# Patient Record
Sex: Female | Born: 1950 | Race: Black or African American | Hispanic: No | Smoking: Former smoker
Health system: Southern US, Community
[De-identification: ages and names within clinical notes are randomized; demographics above are authoritative.]

## PROBLEM LIST (undated history)

## (undated) DIAGNOSIS — I509 Heart failure, unspecified: Secondary | ICD-10-CM

## (undated) DIAGNOSIS — Z9861 Coronary angioplasty status: Secondary | ICD-10-CM

## (undated) DIAGNOSIS — I739 Peripheral vascular disease, unspecified: Secondary | ICD-10-CM

## (undated) DIAGNOSIS — I2510 Atherosclerotic heart disease of native coronary artery without angina pectoris: Secondary | ICD-10-CM

## (undated) DIAGNOSIS — E785 Hyperlipidemia, unspecified: Secondary | ICD-10-CM

## (undated) DIAGNOSIS — E669 Obesity, unspecified: Secondary | ICD-10-CM

## (undated) DIAGNOSIS — E039 Hypothyroidism, unspecified: Secondary | ICD-10-CM

## (undated) DIAGNOSIS — Z955 Presence of coronary angioplasty implant and graft: Secondary | ICD-10-CM

## (undated) DIAGNOSIS — H7190 Unspecified cholesteatoma, unspecified ear: Secondary | ICD-10-CM

## (undated) DIAGNOSIS — I10 Essential (primary) hypertension: Secondary | ICD-10-CM

## (undated) DIAGNOSIS — I2119 ST elevation (STEMI) myocardial infarction involving other coronary artery of inferior wall: Secondary | ICD-10-CM

## (undated) DIAGNOSIS — E1169 Type 2 diabetes mellitus with other specified complication: Secondary | ICD-10-CM

## (undated) DIAGNOSIS — E119 Type 2 diabetes mellitus without complications: Secondary | ICD-10-CM

## (undated) DIAGNOSIS — E230 Hypopituitarism: Secondary | ICD-10-CM

## (undated) HISTORY — DX: Presence of coronary angioplasty implant and graft: Z95.5

## (undated) HISTORY — PX: PITUITARY SURGERY: SHX203

## (undated) HISTORY — DX: Coronary angioplasty status: Z98.61

## (undated) HISTORY — DX: ST elevation (STEMI) myocardial infarction involving other coronary artery of inferior wall: I21.19

## (undated) HISTORY — DX: Type 2 diabetes mellitus with other specified complication: E11.69

## (undated) HISTORY — PX: CHOLECYSTECTOMY: SHX55

## (undated) HISTORY — DX: Obesity, unspecified: E66.9

## (undated) HISTORY — DX: Essential (primary) hypertension: I10

## (undated) HISTORY — DX: Hypopituitarism: E23.0

## (undated) HISTORY — PX: EYE SURGERY: SHX253

## (undated) HISTORY — DX: Type 2 diabetes mellitus without complications: E11.9

## (undated) HISTORY — PX: OTHER SURGICAL HISTORY: SHX169

## (undated) HISTORY — DX: Hyperlipidemia, unspecified: E78.5

## (undated) HISTORY — DX: Atherosclerotic heart disease of native coronary artery without angina pectoris: I25.10

## (undated) HISTORY — DX: Peripheral vascular disease, unspecified: I73.9

## (undated) HISTORY — DX: Hypothyroidism, unspecified: E03.9

---

## 1997-10-17 ENCOUNTER — Ambulatory Visit (HOSPITAL_COMMUNITY): Admission: RE | Admit: 1997-10-17 | Discharge: 1997-10-17 | Payer: Self-pay | Admitting: Neurological Surgery

## 1998-10-12 ENCOUNTER — Ambulatory Visit (HOSPITAL_COMMUNITY): Admission: RE | Admit: 1998-10-12 | Discharge: 1998-10-12 | Payer: Self-pay | Admitting: Neurological Surgery

## 1998-10-15 ENCOUNTER — Encounter: Payer: Self-pay | Admitting: Neurological Surgery

## 1999-08-27 ENCOUNTER — Ambulatory Visit (HOSPITAL_COMMUNITY): Admission: RE | Admit: 1999-08-27 | Discharge: 1999-08-27 | Payer: Self-pay | Admitting: Neurological Surgery

## 1999-08-27 ENCOUNTER — Encounter: Payer: Self-pay | Admitting: Neurological Surgery

## 2000-10-03 ENCOUNTER — Encounter: Payer: Self-pay | Admitting: Neurological Surgery

## 2000-10-03 ENCOUNTER — Ambulatory Visit (HOSPITAL_COMMUNITY): Admission: RE | Admit: 2000-10-03 | Discharge: 2000-10-03 | Payer: Self-pay | Admitting: Neurological Surgery

## 2007-04-14 ENCOUNTER — Emergency Department (HOSPITAL_COMMUNITY): Admission: EM | Admit: 2007-04-14 | Discharge: 2007-04-14 | Payer: Self-pay | Admitting: Emergency Medicine

## 2007-09-16 ENCOUNTER — Emergency Department (HOSPITAL_COMMUNITY): Admission: EM | Admit: 2007-09-16 | Discharge: 2007-09-16 | Payer: Self-pay | Admitting: Emergency Medicine

## 2009-05-18 DIAGNOSIS — E785 Hyperlipidemia, unspecified: Secondary | ICD-10-CM | POA: Insufficient documentation

## 2009-06-15 ENCOUNTER — Encounter: Admission: RE | Admit: 2009-06-15 | Discharge: 2009-06-15 | Payer: Self-pay | Admitting: Endocrinology

## 2009-10-30 ENCOUNTER — Encounter: Admission: RE | Admit: 2009-10-30 | Discharge: 2009-10-30 | Payer: Self-pay | Admitting: Endocrinology

## 2011-02-13 ENCOUNTER — Encounter: Payer: Self-pay | Admitting: Adult Health

## 2011-02-13 ENCOUNTER — Emergency Department (HOSPITAL_COMMUNITY)
Admission: EM | Admit: 2011-02-13 | Discharge: 2011-02-13 | Disposition: A | Discharge status: Home / Self Care | Payer: BC Managed Care – PPO | Attending: Emergency Medicine | Admitting: Emergency Medicine

## 2011-02-13 ENCOUNTER — Emergency Department (HOSPITAL_COMMUNITY): Payer: BC Managed Care – PPO

## 2011-02-13 DIAGNOSIS — E119 Type 2 diabetes mellitus without complications: Secondary | ICD-10-CM | POA: Insufficient documentation

## 2011-02-13 DIAGNOSIS — Z794 Long term (current) use of insulin: Secondary | ICD-10-CM | POA: Insufficient documentation

## 2011-02-13 DIAGNOSIS — M79609 Pain in unspecified limb: Secondary | ICD-10-CM | POA: Insufficient documentation

## 2011-02-13 DIAGNOSIS — M722 Plantar fascial fibromatosis: Secondary | ICD-10-CM | POA: Insufficient documentation

## 2011-02-13 DIAGNOSIS — M79673 Pain in unspecified foot: Secondary | ICD-10-CM

## 2011-02-13 MED ORDER — HYDROCODONE-ACETAMINOPHEN 5-500 MG PO TABS: 1.0000 | ORAL_TABLET | Freq: Four times a day (QID) | ORAL | Status: AC | PRN

## 2011-02-13 MED ORDER — IBUPROFEN 600 MG PO TABS: 600.0000 mg | ORAL_TABLET | Freq: Four times a day (QID) | ORAL | Status: AC | PRN

## 2011-02-13 MED ORDER — IBUPROFEN 200 MG PO TABS
600.0000 mg | ORAL_TABLET | Freq: Once | ORAL | Status: AC
  Administered 2011-02-13: 600 mg via ORAL
  Filled 2011-02-13: qty 3

## 2011-02-13 NOTE — ED Notes (Signed)
Right foot has been hurting for a couple of weeks, this am she stepped on her heel and heard a crunch associated with a sharp pain that went up the back of her heel and calf. Unable to bear weight on right foot at this time.

## 2011-02-13 NOTE — ED Notes (Signed)
Patient transported to X-ray 

## 2011-02-13 NOTE — ED Notes (Signed)
Given crackers and peanut butter  

## 2011-02-13 NOTE — ED Provider Notes (Signed)
History     CSN: [id number] Arrival date & time: 02/13/2011 10:03 AM   First MD Initiated Contact with Patient 02/13/11 1027      Chief Complaint  Patient presents with  . Foot Pain    (Consider location/radiation/quality/duration/timing/severity/associated sxs/prior treatment) Patient is a 60 y.o. female presenting with lower extremity pain. The history is provided by the patient.  Foot Pain  pt states that has been having pain in right heel for past 2-3 weeks. Worse w wt bearing. Dull, constant, non radiating pain. Denies hx plantar fasciitis. States today stood up onto right foot and had crunching sensation in right heel. No calf/achilles tendon area pain. No injury/twisting. No skin changes, swelling, redness or rash. No numbness/weakness.  Past Medical History  Diagnosis Date  . Diabetes mellitus   . Thyroid disease     Past Surgical History  Procedure Date  . Pituitary surgery     History reviewed. No pertinent family history.  History  Substance Use Topics  . Smoking status: Current Everyday Smoker  . Smokeless tobacco: Not on file  . Alcohol Use: No    OB History    Grav Para Term Preterm Abortions TAB SAB Ect Mult Living                  Review of Systems  Constitutional: Negative for fever and chills.  Cardiovascular: Negative for leg swelling.  Neurological: Negative for weakness and numbness.    Allergies  Review of patient's allergies indicates no known allergies.  Home Medications   Current Outpatient Rx  Name Route Sig Dispense Refill  . DEXAMETHASONE 1 MG PO TABS Oral Take 0.5 mg by mouth 2 (two) times daily with a meal.     . INSULIN GLARGINE 100 UNIT/ML [place] SOLN Subcutaneous Inject 42 Units into the skin at bedtime.      . INSULIN LISPRO (HUMAN) 100 UNIT/ML [place] SOLN Subcutaneous Inject into the skin 3 (three) times daily before meals.      [name] LEVOTHYROXINE SODIUM 125 MCG PO TABS Oral Take 175 mcg by mouth daily.        BP 167/89  Pulse  73  Temp(Src) 97.7 F (36.5 C) (Oral)  Resp 20  SpO2 100%  Physical Exam  Nursing note and vitals reviewed. Constitutional: She is oriented to person, place, and time. She appears well-developed and well-nourished. No distress.  Eyes: Conjunctivae are normal. No scleral icterus.  Neck: Neck supple. No tracheal deviation present.  Cardiovascular: Normal rate and intact distal pulses.   Pulmonary/Chest: Effort normal. No respiratory distress.  Abdominal: Normal appearance. She exhibits no distension.  Musculoskeletal: She exhibits no edema.       Skin intact. Right dp/pt intact. Ankle stable, no malleolar tenderness. Tenderness right heel inferiorly and along plantar fascia. Also tenderness to heel laterally. Achilles intact. Pt able to plantar and dorsiflex - with dorsiflexion, pain in heel area inferiorly. No calf swelling or tenderness. No cellulitis.    Neurological: She is alert and oriented to person, place, and time.       Motor intact  Skin: Skin is warm and dry. No rash noted.  Psychiatric: She has a normal mood and affect.    ED Course  Procedures (including critical care time)  Labs Reviewed - No data to display No results found. No results found for this or any previous visit. Dg Foot Complete Right  02/13/2011  *RADIOLOGY REPORT*  Clinical Data: Injury  RIGHT FOOT COMPLETE - 3+ VIEW  Comparison: None.  Findings: Minimal spur at the inferior calcaneus.  Mild hallux valgus.  Mild degenerative change of the first metatarsal- phalangeal joint.  No acute fracture and no dislocation.  The soft tissue swelling at the ankle. Mild osteopenia.  IMPRESSION: No acute bony pathology.  Chronic change.  Original Report Authenticated By: Donavan Burnet, M.D.      No diagnosis found.    MDM  Xrays. Pt drove self. Motrin po.         Suzi Roots, MD 02/13/11 1159

## 2013-08-22 ENCOUNTER — Encounter (INDEPENDENT_AMBULATORY_CARE_PROVIDER_SITE_OTHER): Payer: Self-pay | Admitting: Surgery

## 2013-08-22 DIAGNOSIS — I2510 Atherosclerotic heart disease of native coronary artery without angina pectoris: Secondary | ICD-10-CM | POA: Insufficient documentation

## 2013-08-22 DIAGNOSIS — I214 Non-ST elevation (NSTEMI) myocardial infarction: Secondary | ICD-10-CM | POA: Insufficient documentation

## 2013-08-22 DIAGNOSIS — Z9861 Coronary angioplasty status: Secondary | ICD-10-CM

## 2013-08-22 DIAGNOSIS — I2119 ST elevation (STEMI) myocardial infarction involving other coronary artery of inferior wall: Secondary | ICD-10-CM

## 2013-08-22 HISTORY — DX: Atherosclerotic heart disease of native coronary artery without angina pectoris: I25.10

## 2013-08-22 HISTORY — DX: ST elevation (STEMI) myocardial infarction involving other coronary artery of inferior wall: I21.19

## 2013-08-29 ENCOUNTER — Inpatient Hospital Stay (HOSPITAL_COMMUNITY)
Admission: EM | Admit: 2013-08-29 | Discharge: 2013-09-03 | DRG: 250 | Disposition: A | Discharge status: Home / Self Care | Payer: BC Managed Care – PPO | Attending: Internal Medicine | Admitting: Internal Medicine

## 2013-08-29 ENCOUNTER — Encounter (HOSPITAL_COMMUNITY): Payer: Self-pay | Admitting: Emergency Medicine

## 2013-08-29 DIAGNOSIS — I2119 ST elevation (STEMI) myocardial infarction involving other coronary artery of inferior wall: Secondary | ICD-10-CM | POA: Diagnosis present

## 2013-08-29 DIAGNOSIS — D62 Acute posthemorrhagic anemia: Secondary | ICD-10-CM | POA: Diagnosis present

## 2013-08-29 DIAGNOSIS — K260 Acute duodenal ulcer with hemorrhage: Secondary | ICD-10-CM | POA: Diagnosis not present

## 2013-08-29 DIAGNOSIS — E876 Hypokalemia: Secondary | ICD-10-CM | POA: Diagnosis present

## 2013-08-29 DIAGNOSIS — E039 Hypothyroidism, unspecified: Secondary | ICD-10-CM | POA: Diagnosis present

## 2013-08-29 DIAGNOSIS — I5032 Chronic diastolic (congestive) heart failure: Secondary | ICD-10-CM | POA: Diagnosis present

## 2013-08-29 DIAGNOSIS — K429 Umbilical hernia without obstruction or gangrene: Secondary | ICD-10-CM | POA: Diagnosis present

## 2013-08-29 DIAGNOSIS — I959 Hypotension, unspecified: Secondary | ICD-10-CM | POA: Diagnosis present

## 2013-08-29 DIAGNOSIS — K2960 Other gastritis without bleeding: Secondary | ICD-10-CM | POA: Diagnosis present

## 2013-08-29 DIAGNOSIS — I2582 Chronic total occlusion of coronary artery: Secondary | ICD-10-CM | POA: Diagnosis present

## 2013-08-29 DIAGNOSIS — R062 Wheezing: Secondary | ICD-10-CM

## 2013-08-29 DIAGNOSIS — K2980 Duodenitis without bleeding: Secondary | ICD-10-CM | POA: Diagnosis present

## 2013-08-29 DIAGNOSIS — IMO0002 Reserved for concepts with insufficient information to code with codable children: Secondary | ICD-10-CM

## 2013-08-29 DIAGNOSIS — K264 Chronic or unspecified duodenal ulcer with hemorrhage: Secondary | ICD-10-CM | POA: Diagnosis present

## 2013-08-29 DIAGNOSIS — R509 Fever, unspecified: Secondary | ICD-10-CM | POA: Diagnosis present

## 2013-08-29 DIAGNOSIS — E119 Type 2 diabetes mellitus without complications: Secondary | ICD-10-CM

## 2013-08-29 DIAGNOSIS — Z87891 Personal history of nicotine dependence: Secondary | ICD-10-CM

## 2013-08-29 DIAGNOSIS — E230 Hypopituitarism: Secondary | ICD-10-CM | POA: Diagnosis present

## 2013-08-29 DIAGNOSIS — E2749 Other adrenocortical insufficiency: Secondary | ICD-10-CM | POA: Diagnosis present

## 2013-08-29 DIAGNOSIS — I10 Essential (primary) hypertension: Secondary | ICD-10-CM

## 2013-08-29 DIAGNOSIS — IMO0001 Reserved for inherently not codable concepts without codable children: Secondary | ICD-10-CM

## 2013-08-29 DIAGNOSIS — A419 Sepsis, unspecified organism: Secondary | ICD-10-CM

## 2013-08-29 DIAGNOSIS — I2510 Atherosclerotic heart disease of native coronary artery without angina pectoris: Secondary | ICD-10-CM | POA: Diagnosis present

## 2013-08-29 DIAGNOSIS — R579 Shock, unspecified: Secondary | ICD-10-CM | POA: Diagnosis present

## 2013-08-29 DIAGNOSIS — Z794 Long term (current) use of insulin: Secondary | ICD-10-CM

## 2013-08-29 DIAGNOSIS — K648 Other hemorrhoids: Secondary | ICD-10-CM | POA: Diagnosis present

## 2013-08-29 DIAGNOSIS — K922 Gastrointestinal hemorrhage, unspecified: Secondary | ICD-10-CM

## 2013-08-29 LAB — I-STAT CHEM 8, ED
BUN: 9 mg/dL (ref 6–23)
Calcium, Ion: 1.14 mmol/L (ref 1.13–1.30)
Chloride: 97 mEq/L (ref 96–112)
Creatinine, Ser: 1.1 mg/dL (ref 0.50–1.10)
Glucose, Bld: 154 mg/dL — ABNORMAL HIGH (ref 70–99)
HCT: 46 % (ref 36.0–46.0)
Hemoglobin: 15.6 g/dL — ABNORMAL HIGH (ref 12.0–15.0)
Potassium: 3.7 mEq/L (ref 3.7–5.3)
Sodium: 142 mEq/L (ref 137–147)
TCO2: 26 mmol/L (ref 0–100)

## 2013-08-29 LAB — CBC WITH DIFFERENTIAL/PLATELET
Basophils Absolute: 0.1 10*3/uL (ref 0.0–0.1)
Basophils Relative: 1 % (ref 0–1)
Eosinophils Absolute: 0 10*3/uL (ref 0.0–0.7)
Eosinophils Relative: 0 % (ref 0–5)
HCT: 40.8 % (ref 36.0–46.0)
Hemoglobin: 13 g/dL (ref 12.0–15.0)
Lymphocytes Relative: 16 % (ref 12–46)
Lymphs Abs: 2.4 10*3/uL (ref 0.7–4.0)
MCH: 27.9 pg (ref 26.0–34.0)
MCHC: 31.9 g/dL (ref 30.0–36.0)
MCV: 87.6 fL (ref 78.0–100.0)
Monocytes Absolute: 1.5 10*3/uL — ABNORMAL HIGH (ref 0.1–1.0)
Monocytes Relative: 10 % (ref 3–12)
Neutro Abs: 10.4 10*3/uL — ABNORMAL HIGH (ref 1.7–7.7)
Neutrophils Relative %: 73 % (ref 43–77)
Platelets: 399 10*3/uL (ref 150–400)
RBC: 4.66 MIL/uL (ref 3.87–5.11)
RDW: 15 % (ref 11.5–15.5)
WBC: 14.4 10*3/uL — ABNORMAL HIGH (ref 4.0–10.5)

## 2013-08-29 LAB — I-STAT TROPONIN, ED: Troponin i, poc: 25.98 ng/mL (ref 0.00–0.08)

## 2013-08-29 LAB — I-STAT CG4 LACTIC ACID, ED: Lactic Acid, Venous: 2.56 mmol/L — ABNORMAL HIGH (ref 0.5–2.2)

## 2013-08-29 MED ORDER — ASPIRIN 81 MG PO CHEW
324.0000 mg | CHEWABLE_TABLET | Freq: Once | ORAL | Status: AC
  Administered 2013-08-29: 324 mg via ORAL

## 2013-08-29 MED ORDER — HYDROCORTISONE NA SUCCINATE PF 100 MG IJ SOLR
100.0000 mg | INTRAMUSCULAR | Status: AC
  Administered 2013-08-30: 100 mg via INTRAVENOUS
  Filled 2013-08-29: qty 2

## 2013-08-29 MED ORDER — SODIUM CHLORIDE 0.9 % IV BOLUS (SEPSIS)
1000.0000 mL | Freq: Once | INTRAVENOUS | Status: AC
  Administered 2013-08-29: 1000 mL via INTRAVENOUS

## 2013-08-29 MED ORDER — ACETAMINOPHEN 650 MG RE SUPP
650.0000 mg | Freq: Once | RECTAL | Status: AC
  Administered 2013-08-29: 650 mg via RECTAL
  Filled 2013-08-29: qty 1

## 2013-08-29 MED ORDER — ASPIRIN 81 MG PO CHEW
CHEWABLE_TABLET | ORAL | Status: AC
  Filled 2013-08-29: qty 4

## 2013-08-29 MED ORDER — SODIUM CHLORIDE 0.9 % IV BOLUS (SEPSIS)
1000.0000 mL | Freq: Once | INTRAVENOUS | Status: AC
  Administered 2013-08-30: 1000 mL via INTRAVENOUS

## 2013-08-29 MED ORDER — MORPHINE SULFATE 4 MG/ML IJ SOLN
INTRAMUSCULAR | Status: AC
  Filled 2013-08-29: qty 1

## 2013-08-29 MED ORDER — HEPARIN SODIUM (PORCINE) 5000 UNIT/ML IJ SOLN
INTRAMUSCULAR | Status: AC
  Filled 2013-08-29: qty 1

## 2013-08-29 MED ORDER — NITROGLYCERIN 0.4 MG SL SUBL
SUBLINGUAL_TABLET | SUBLINGUAL | Status: AC
  Filled 2013-08-29: qty 1

## 2013-08-29 MED ORDER — HEPARIN SODIUM (PORCINE) 5000 UNIT/ML IJ SOLN
5000.0000 [IU] | Freq: Once | INTRAMUSCULAR | Status: AC
  Administered 2013-08-29: 5000 [IU] via INTRAVENOUS

## 2013-08-29 MED ORDER — ONDANSETRON HCL 4 MG/2ML IJ SOLN
4.0000 mg | Freq: Once | INTRAMUSCULAR | Status: AC
  Administered 2013-08-29: 4 mg via INTRAVENOUS

## 2013-08-29 MED ORDER — ONDANSETRON HCL 4 MG/2ML IJ SOLN
INTRAMUSCULAR | Status: AC
  Filled 2013-08-29: qty 2

## 2013-08-29 MED ORDER — ONDANSETRON HCL 4 MG/2ML IJ SOLN: 4.0000 mg | Freq: Once | INTRAMUSCULAR | Status: DC

## 2013-08-29 NOTE — ED Notes (Signed)
Dr Alva Garnet at bedside.

## 2013-08-29 NOTE — ED Notes (Signed)
Dr. Harding at bedside.

## 2013-08-29 NOTE — ED Notes (Signed)
Sharita-sec informed to call Level 2 code sepsis.

## 2013-08-29 NOTE — Consult Note (Signed)
CARDIOLOGY CONSULT NOTE  Patient ID: Tammy Good MRN: [id number] DOB/AGE: 1950-12-21 63 y.o.  Admit date: 08/29/2013 Primary Physician: Dr. 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  She has had profuse watery diarrhea today.  She has had some gagging but no emesis.  +Fevers/chills, temp 103.9 in ER.  She has had abdominal pain, primarily lower abdomen and in the peri-umbilical area.    In the ER, she was mildly hypotensive with SBP in 80s.  BP increased into 120s with IV fluid infusion.  As above, she had fever/chills.  ECG showed inferior MI, recent.  TnI was 25.  She denies any chest pain.  She has had mild dyspnea along with the abdominal pain.  No history of cardiac disease.   Review of systems complete and found to be negative unless listed above in HPI  Past Medical History: 1. Type II diabetes 2. 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    FH: No premature CAD  History   Social History  . Marital Status: Widowed    Spouse Name: N/A    Number of Children: N/A  . Years of Education: N/A   Occupational History  . Not on file.   Social History Main Topics  . Smoking status: Former Research scientist (life sciences)  . Smokeless tobacco: Not on file  . Alcohol Use: No  . Drug Use: No  . Sexual Activity: Not on file   Other Topics Concern  . Not on file   Social History Narrative  . No narrative on file    Current Facility-Administered Medications  Medication Dose Route Frequency Provider Last Rate Last Dose  . [START ON 08/30/2013] hydrocortisone sodium succinate (SOLU-CORTEF) 100 MG injection 100 mg  100 mg Intravenous STAT Kalman Drape, MD      . Derrill Memo ON 08/30/2013] sodium chloride 0.9 % bolus 1,000 mL  1,000 mL Intravenous Once Leonie Man, MD       Current  Outpatient Prescriptions  Medication Sig Dispense Refill  . dexamethasone (DECADRON) 1 MG tablet Take 0.5 mg by mouth 2 (two) times daily with a meal.       . insulin glargine (LANTUS) 100 UNIT/ML injection Inject 42 Units into the skin at bedtime.        . insulin lispro (HUMALOG) 100 UNIT/ML injection Inject into the skin 3 (three) times daily before meals.        . INVOKANA 300 MG TABS       . levothyroxine (SYNTHROID, LEVOTHROID) 125 MCG tablet Take 175 mcg by mouth daily.        [name] lisinopril (PRINIVIL,ZESTRIL) 5 MG tablet         Physical exam Blood pressure 108/63, pulse 100, temperature 103.9 F (39.9 C), temperature source Oral, resp. rate 20, SpO2 100.00%. General: NAD, obese Neck: Thick, no JVD, no thyromegaly or thyroid nodule.  Lungs: Clear to auscultation bilaterally with normal respiratory effort. CV: Nondisplaced PMI.  Heart regular S1/S2, no S3/S4, no murmur.  No peripheral edema.  No carotid bruit.  Normal pedal pulses.  Abdomen: Soft, tender umbilical area and lower abdomen (no rebound/rigidity/guarding), no hepatosplenomegaly, no distention.  Umbilical hernia is small and not incarcerated.  Skin: Intact without lesions or rashes.  Neurologic: Alert and oriented x 3.  Psych: Normal affect. Extremities: No clubbing  or cyanosis.  HEENT: Normal.   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  She was noted by ECG and cardiac enzymes to have inferior STEMI, recent.   1. CAD:  Inferior STEMI.  It is difficult to determine when the onset of this was.  Possibly when nausea began on Saturday, but as there is another process going on that could have caused nausea, this is not certain.  She has had no chest pain. She has a co-existing abdominal process with fever and diarrhea.  Peri-umbilical tenderness.  She does not have peritoneal signs but she is also on steroids so I do worry that severe pathology could be masked.  On discussion with Drs Alva Garnet and Ellyn Hack, we decided to get a stat abdominal CT.  If there is not an abdominal catastrophe present, we will plan to take her straight to the cath lab tonight.  2. ID: Fever to 256.3, peri-umbilical abdominal pain without peritoneal signs, profuse diarrhea today with nausea x 3 days.  ? Gastroenteritis but fever is high for this.  No recent antibiotics.  Diverticulitis is possible.  Her umbilical hernia does not appear on exam to be incarcerated.  She has early sepsis with SBP in 80s initially requiring IV fluid.  - Continue IVF - Stress dose steroids (hydrocortisone) - Empiric antibiotic coverage - stat abdominal CT as above prior to going to cath lab.  3. Hypothyroidism: Continue Levoxyl. 4. Diabetes: Sliding scale insulin initialy.   Larey Dresser 08/30/2013 12:17 AM   Signed: @ME1 @ 08/29/2013, 11:57 PM Co-Sign MD

## 2013-08-29 NOTE — ED Notes (Signed)
Patient with abdominal pain and nausea, no vomiting.  Patient does have a hernia that she is supposed to follow up with a surgeon this week.  Patient continues with diarrhea.  This all started on Saturday evening.  Patient has not been able to eat any food, but she has been able to keep water down.

## 2013-08-29 NOTE — ED Notes (Signed)
Dr.McLean at bedside  

## 2013-08-29 NOTE — ED Notes (Signed)
Cath lab ready, [phone]

## 2013-08-30 ENCOUNTER — Encounter (HOSPITAL_COMMUNITY)
Admission: EM | Disposition: A | Discharge status: Home / Self Care | Payer: BC Managed Care – PPO | Source: Home / Self Care | Attending: Pulmonary Disease

## 2013-08-30 ENCOUNTER — Emergency Department (HOSPITAL_COMMUNITY): Payer: BC Managed Care – PPO

## 2013-08-30 ENCOUNTER — Encounter (HOSPITAL_COMMUNITY): Payer: Self-pay | Admitting: Radiology

## 2013-08-30 DIAGNOSIS — I2510 Atherosclerotic heart disease of native coronary artery without angina pectoris: Secondary | ICD-10-CM

## 2013-08-30 DIAGNOSIS — R579 Shock, unspecified: Secondary | ICD-10-CM | POA: Diagnosis present

## 2013-08-30 DIAGNOSIS — E230 Hypopituitarism: Secondary | ICD-10-CM | POA: Diagnosis present

## 2013-08-30 DIAGNOSIS — E119 Type 2 diabetes mellitus without complications: Secondary | ICD-10-CM

## 2013-08-30 DIAGNOSIS — A419 Sepsis, unspecified organism: Secondary | ICD-10-CM

## 2013-08-30 DIAGNOSIS — I517 Cardiomegaly: Secondary | ICD-10-CM

## 2013-08-30 DIAGNOSIS — I2119 ST elevation (STEMI) myocardial infarction involving other coronary artery of inferior wall: Secondary | ICD-10-CM | POA: Diagnosis present

## 2013-08-30 DIAGNOSIS — I959 Hypotension, unspecified: Secondary | ICD-10-CM | POA: Diagnosis present

## 2013-08-30 DIAGNOSIS — R509 Fever, unspecified: Secondary | ICD-10-CM | POA: Diagnosis present

## 2013-08-30 DIAGNOSIS — Z794 Long term (current) use of insulin: Secondary | ICD-10-CM

## 2013-08-30 HISTORY — PX: LEFT HEART CATHETERIZATION WITH CORONARY ANGIOGRAM: SHX5451

## 2013-08-30 HISTORY — PX: TRANSTHORACIC ECHOCARDIOGRAM: SHX275

## 2013-08-30 HISTORY — PX: PERCUTANEOUS CORONARY STENT INTERVENTION (PCI-S): SHX5485

## 2013-08-30 LAB — COMPREHENSIVE METABOLIC PANEL
ALT: 40 U/L — ABNORMAL HIGH (ref 0–35)
AST: 182 U/L — ABNORMAL HIGH (ref 0–37)
Albumin: 3.3 g/dL — ABNORMAL LOW (ref 3.5–5.2)
Alkaline Phosphatase: 80 U/L (ref 39–117)
BUN: 10 mg/dL (ref 6–23)
CO2: 24 mEq/L (ref 19–32)
Calcium: 9.1 mg/dL (ref 8.4–10.5)
Chloride: 97 mEq/L (ref 96–112)
Creatinine, Ser: 0.96 mg/dL (ref 0.50–1.10)
GFR calc Af Amer: 72 mL/min — ABNORMAL LOW (ref >90)
GFR calc non Af Amer: 63 mL/min — ABNORMAL LOW (ref >90)
Glucose, Bld: 153 mg/dL — ABNORMAL HIGH (ref 70–99)
Potassium: 3.9 mEq/L (ref 3.7–5.3)
Sodium: 139 mEq/L (ref 137–147)
Total Bilirubin: 1.1 mg/dL (ref 0.3–1.2)
Total Protein: 7.9 g/dL (ref 6.0–8.3)

## 2013-08-30 LAB — GLUCOSE, CAPILLARY
Glucose-Capillary: 122 mg/dL — ABNORMAL HIGH (ref 70–99)
Glucose-Capillary: 132 mg/dL — ABNORMAL HIGH (ref 70–99)
Glucose-Capillary: 140 mg/dL — ABNORMAL HIGH (ref 70–99)
Glucose-Capillary: 146 mg/dL — ABNORMAL HIGH (ref 70–99)
Glucose-Capillary: 185 mg/dL — ABNORMAL HIGH (ref 70–99)

## 2013-08-30 LAB — CBC
HCT: 28.3 % — ABNORMAL LOW (ref 36.0–46.0)
Hemoglobin: 9.2 g/dL — ABNORMAL LOW (ref 12.0–15.0)
MCH: 27.5 pg (ref 26.0–34.0)
MCHC: 31.1 g/dL (ref 30.0–36.0)
MCV: 88.4 fL (ref 78.0–100.0)
Platelets: 296 10*3/uL (ref 150–400)
RBC: 3.2 MIL/uL — ABNORMAL LOW (ref 3.87–5.11)
RDW: 15.2 % (ref 11.5–15.5)
WBC: 16.5 10*3/uL — ABNORMAL HIGH (ref 4.0–10.5)

## 2013-08-30 LAB — PROCALCITONIN: Procalcitonin: 0.21 ng/mL

## 2013-08-30 LAB — PROTIME-INR
INR: 1.22 (ref 0.00–1.49)
Prothrombin Time: 15.1 seconds (ref 11.6–15.2)

## 2013-08-30 LAB — SEDIMENTATION RATE: Sed Rate: 52 mm/hr — ABNORMAL HIGH (ref 0–22)

## 2013-08-30 LAB — CBG MONITORING, ED: Glucose-Capillary: 133 mg/dL — ABNORMAL HIGH (ref 70–99)

## 2013-08-30 LAB — MRSA PCR SCREENING: MRSA by PCR: NEGATIVE

## 2013-08-30 LAB — MAGNESIUM: Magnesium: 2.1 mg/dL (ref 1.5–2.5)

## 2013-08-30 LAB — POCT ACTIVATED CLOTTING TIME: Activated Clotting Time: 658 seconds

## 2013-08-30 SURGERY — LEFT HEART CATHETERIZATION WITH CORONARY ANGIOGRAM
Anesthesia: LOCAL

## 2013-08-30 MED ORDER — SODIUM CHLORIDE 0.9 % IV SOLN
INTRAVENOUS | Status: DC
  Administered 2013-08-30: 03:00:00 via INTRAVENOUS

## 2013-08-30 MED ORDER — SODIUM CHLORIDE 0.45 % IV SOLN
INTRAVENOUS | Status: DC
Start: 2013-08-30 — End: 2013-08-30

## 2013-08-30 MED ORDER — VERAPAMIL HCL 2.5 MG/ML IV SOLN
INTRAVENOUS | Status: AC
  Filled 2013-08-30: qty 2

## 2013-08-30 MED ORDER — PIPERACILLIN-TAZOBACTAM 3.375 G IVPB 30 MIN
3.3750 g | Freq: Once | INTRAVENOUS | Status: AC
  Administered 2013-08-30: 3.375 g via INTRAVENOUS
  Filled 2013-08-30: qty 50

## 2013-08-30 MED ORDER — DEXTROSE 5 % IV SOLN
2.0000 ug/min | INTRAVENOUS | Status: DC
  Administered 2013-08-30: 2 ug/min via INTRAVENOUS
  Filled 2013-08-30: qty 4

## 2013-08-30 MED ORDER — "THROMBI-PAD 3""X3"" EX PADS"
1.0000 | MEDICATED_PAD | Freq: Once | CUTANEOUS | Status: AC
  Administered 2013-08-30: 1 via TOPICAL
  Filled 2013-08-30: qty 1

## 2013-08-30 MED ORDER — IOHEXOL 300 MG/ML  SOLN
100.0000 mL | Freq: Once | INTRAMUSCULAR | Status: AC | PRN
  Administered 2013-08-30: 100 mL via INTRAVENOUS

## 2013-08-30 MED ORDER — ASPIRIN 81 MG PO CHEW
81.0000 mg | CHEWABLE_TABLET | Freq: Every day | ORAL | Status: DC
  Administered 2013-08-30 – 2013-09-03 (×5): 81 mg via ORAL
  Filled 2013-08-30 (×5): qty 1

## 2013-08-30 MED ORDER — BIVALIRUDIN 250 MG IV SOLR
INTRAVENOUS | Status: AC
  Filled 2013-08-30: qty 250

## 2013-08-30 MED ORDER — PERFLUTREN LIPID MICROSPHERE
1.0000 mL | INTRAVENOUS | Status: AC | PRN
  Administered 2013-08-30: 3 mL via INTRAVENOUS
  Filled 2013-08-30: qty 10

## 2013-08-30 MED ORDER — VANCOMYCIN HCL 10 G IV SOLR
1500.0000 mg | Freq: Once | INTRAVENOUS | Status: AC
  Administered 2013-08-30: 1500 mg via INTRAVENOUS
  Filled 2013-08-30: qty 1500

## 2013-08-30 MED ORDER — TIROFIBAN HCL IV 5 MG/100ML
0.1500 ug/kg/min | INTRAVENOUS | Status: DC
  Administered 2013-08-30: 0.15 ug/kg/min via INTRAVENOUS

## 2013-08-30 MED ORDER — ATROPINE SULFATE 0.1 MG/ML IJ SOLN
INTRAMUSCULAR | Status: AC
  Filled 2013-08-30: qty 10

## 2013-08-30 MED ORDER — NITROGLYCERIN 0.2 MG/ML ON CALL CATH LAB
INTRAVENOUS | Status: AC
  Filled 2013-08-30: qty 1

## 2013-08-30 MED ORDER — PANTOPRAZOLE SODIUM 40 MG IV SOLR
40.0000 mg | INTRAVENOUS | Status: DC
  Administered 2013-08-30: 40 mg via INTRAVENOUS
  Filled 2013-08-30 (×3): qty 40

## 2013-08-30 MED ORDER — MIDAZOLAM HCL 2 MG/2ML IJ SOLN
INTRAMUSCULAR | Status: AC
  Filled 2013-08-30: qty 2

## 2013-08-30 MED ORDER — LIDOCAINE HCL (PF) 1 % IJ SOLN
INTRAMUSCULAR | Status: AC
  Filled 2013-08-30: qty 30

## 2013-08-30 MED ORDER — LEVOTHYROXINE SODIUM 100 MCG IV SOLR
87.5000 ug | Freq: Every day | INTRAVENOUS | Status: DC
  Administered 2013-08-30 – 2013-09-02 (×4): 87.5 ug via INTRAVENOUS
  Filled 2013-08-30 (×4): qty 5

## 2013-08-30 MED ORDER — INSULIN ASPART 100 UNIT/ML ~~LOC~~ SOLN
0.0000 [IU] | Freq: Three times a day (TID) | SUBCUTANEOUS | Status: DC
  Administered 2013-08-31 (×2): 4 [IU] via SUBCUTANEOUS
  Administered 2013-09-01: 3 [IU] via SUBCUTANEOUS
  Administered 2013-09-01 – 2013-09-02 (×4): 4 [IU] via SUBCUTANEOUS
  Administered 2013-09-03: 3 [IU] via SUBCUTANEOUS

## 2013-08-30 MED ORDER — PANTOPRAZOLE SODIUM 40 MG IV SOLR
40.0000 mg | INTRAVENOUS | Status: DC
  Administered 2013-08-30: 40 mg via INTRAVENOUS
  Filled 2013-08-30: qty 40

## 2013-08-30 MED ORDER — INSULIN ASPART 100 UNIT/ML ~~LOC~~ SOLN
0.0000 [IU] | SUBCUTANEOUS | Status: DC
  Administered 2013-08-30: 3 [IU] via SUBCUTANEOUS
  Administered 2013-08-30: 17:00:00 via SUBCUTANEOUS
  Administered 2013-08-30: 3 [IU] via SUBCUTANEOUS

## 2013-08-30 MED ORDER — HEPARIN SODIUM (PORCINE) 1000 UNIT/ML IJ SOLN
INTRAMUSCULAR | Status: AC
  Filled 2013-08-30: qty 1

## 2013-08-30 MED ORDER — NOREPINEPHRINE BITARTRATE 1 MG/ML IV SOLN
INTRAVENOUS | Status: AC
  Filled 2013-08-30: qty 4

## 2013-08-30 MED ORDER — INSULIN GLARGINE 100 UNIT/ML ~~LOC~~ SOLN
25.0000 [IU] | Freq: Every day | SUBCUTANEOUS | Status: DC
  Administered 2013-08-30: 25 [IU] via SUBCUTANEOUS
  Filled 2013-08-30 (×3): qty 0.25

## 2013-08-30 MED ORDER — VANCOMYCIN HCL 10 G IV SOLR
1250.0000 mg | Freq: Two times a day (BID) | INTRAVENOUS | Status: DC
  Administered 2013-08-30 – 2013-08-31 (×2): 1250 mg via INTRAVENOUS
  Filled 2013-08-30 (×3): qty 1250

## 2013-08-30 MED ORDER — HEPARIN SODIUM (PORCINE) 5000 UNIT/ML IJ SOLN
5000.0000 [IU] | Freq: Three times a day (TID) | INTRAMUSCULAR | Status: DC
  Administered 2013-08-30 (×2): 5000 [IU] via SUBCUTANEOUS
  Filled 2013-08-30 (×7): qty 1

## 2013-08-30 MED ORDER — TIROFIBAN HCL IV 5 MG/100ML
0.1500 ug/kg/min | INTRAVENOUS | Status: AC
  Administered 2013-08-30 (×2): 0.15 ug/kg/min via INTRAVENOUS

## 2013-08-30 MED ORDER — PERFLUTREN LIPID MICROSPHERE
INTRAVENOUS | Status: AC
  Administered 2013-08-30: 3 mL via INTRAVENOUS
  Filled 2013-08-30: qty 10

## 2013-08-30 MED ORDER — ALPRAZOLAM 0.5 MG PO TABS
0.5000 mg | ORAL_TABLET | Freq: Once | ORAL | Status: AC
  Administered 2013-08-30: 0.5 mg via ORAL
  Filled 2013-08-30: qty 1

## 2013-08-30 MED ORDER — HYDROCORTISONE NA SUCCINATE PF 100 MG IJ SOLR
50.0000 mg | Freq: Four times a day (QID) | INTRAMUSCULAR | Status: DC
  Administered 2013-08-30 – 2013-09-02 (×14): 50 mg via INTRAVENOUS
  Filled 2013-08-30 (×20): qty 1

## 2013-08-30 MED ORDER — SODIUM CHLORIDE 0.9 % IV SOLN: 250.0000 mL | INTRAVENOUS | Status: DC | PRN

## 2013-08-30 MED ORDER — GUAIFENESIN-DM 100-10 MG/5ML PO SYRP: 15.0000 mL | ORAL_SOLUTION | ORAL | Status: DC | PRN

## 2013-08-30 MED ORDER — TICAGRELOR 90 MG PO TABS
ORAL_TABLET | ORAL | Status: AC
  Filled 2013-08-30: qty 1

## 2013-08-30 MED ORDER — HEPARIN (PORCINE) IN NACL 2-0.9 UNIT/ML-% IJ SOLN
INTRAMUSCULAR | Status: AC
  Filled 2013-08-30: qty 1000

## 2013-08-30 MED ORDER — TICAGRELOR 90 MG PO TABS
ORAL_TABLET | ORAL | Status: AC
  Administered 2013-08-30: 90 mg via ORAL
  Filled 2013-08-30: qty 1

## 2013-08-30 MED ORDER — FENTANYL CITRATE 0.05 MG/ML IJ SOLN
INTRAMUSCULAR | Status: AC
  Filled 2013-08-30: qty 2

## 2013-08-30 MED ORDER — SODIUM CHLORIDE 0.9 % IJ SOLN: 3.0000 mL | INTRAMUSCULAR | Status: DC | PRN

## 2013-08-30 MED ORDER — PIPERACILLIN-TAZOBACTAM 3.375 G IVPB
3.3750 g | Freq: Three times a day (TID) | INTRAVENOUS | Status: DC
  Administered 2013-08-30 – 2013-09-01 (×8): 3.375 g via INTRAVENOUS
  Filled 2013-08-30 (×10): qty 50

## 2013-08-30 MED ORDER — MORPHINE SULFATE 2 MG/ML IJ SOLN: 2.0000 mg | INTRAMUSCULAR | Status: DC | PRN

## 2013-08-30 MED ORDER — TICAGRELOR 90 MG PO TABS
90.0000 mg | ORAL_TABLET | Freq: Two times a day (BID) | ORAL | Status: DC
  Administered 2013-08-30 – 2013-09-03 (×8): 90 mg via ORAL
  Filled 2013-08-30 (×10): qty 1

## 2013-08-30 MED ORDER — SODIUM CHLORIDE 0.9 % IJ SOLN: 3.0000 mL | Freq: Two times a day (BID) | INTRAMUSCULAR | Status: DC

## 2013-08-30 NOTE — Progress Notes (Signed)
ANTIBIOTIC CONSULT NOTE - INITIAL  Pharmacy Consult for vancomycin and zosyn  Indication: rule out sepsis  No Known Allergies  Patient Measurements: Height: 5\' 7"  (170.2 cm) Weight: 234 lb 5.6 oz (106.3 kg) IBW/kg (Calculated) : 61.6 Adjusted Body Weight:   Vital Signs: Temp: 103.9 F (39.9 C) (06/08 2310) Temp src: Oral (06/08 2310) BP: 97/61 mmHg (06/09 0010) Pulse Rate: 45 (06/09 0029) Intake/Output from previous day: 06/08 0701 - 06/09 0700 In: 100 [I.V.:100] Out: -  Intake/Output from this shift: Total I/O In: 100 [I.V.:100] Out: -   Labs:  Recent Labs  08/29/13 2300 08/29/13 2326  WBC 14.4*  --   HGB 13.0 15.6*  PLT 399  --   CREATININE 0.96 1.10   Estimated Creatinine Clearance: 67.4 ml/min (by C-G formula based on Cr of 1.1). No results found for this basename: VANCOTROUGH, VANCOPEAK, VANCORANDOM, GENTTROUGH, GENTPEAK, GENTRANDOM, TOBRATROUGH, TOBRAPEAK, TOBRARND, AMIKACINPEAK, AMIKACINTROU, AMIKACIN,  in the last 72 hours   Microbiology: No results found for this or any previous visit (from the past 720 hour(s)).  Medical History: Past Medical History  Diagnosis Date  . Diabetes mellitus   . Thyroid disease     Medications:  Prescriptions prior to admission  Medication Sig Dispense Refill  . dexamethasone (DECADRON) 1 MG tablet Take 0.5 mg by mouth 2 (two) times daily with a meal.       . insulin glargine (LANTUS) 100 UNIT/ML injection Inject 42 Units into the skin at bedtime.        . insulin lispro (HUMALOG) 100 UNIT/ML injection Inject into the skin 3 (three) times daily before meals.        . INVOKANA 300 MG TABS       . levothyroxine (SYNTHROID, LEVOTHROID) 125 MCG tablet Take 175 mcg by mouth daily.        [name] lisinopril (PRINIVIL,ZESTRIL) 5 MG tablet        Assessment: 63 yo with DMII and hypotpituitarism on thyroid replacement present with 3 days nausea and perfuse watery diarrhea starting 5/8  Fever of 103.9 and rigors. Initially a code  sepsis was called in ED but ekg revealed st elevation and laterl depressions with initial troponin of 25. Upgraded to stemi. CT of abdomen was negative. Pt transferred to 2h after successful PCI. Vanc and zosyn to begin for sepsis coverage.  Goal of Therapy:  Vancomycin trough level 15-20 mcg/ml  Plan:  vanc 1500 mg x1 alon with zosyn 3.375 30 min infusion   sent to cath holding  Continue with vancomycin 1200 mg q12h  Zosyn 3.375 gm q8h   Curlene Dolphin 08/30/2013,3:20 AM

## 2013-08-30 NOTE — ED Notes (Signed)
To Cath lab 

## 2013-08-30 NOTE — Progress Notes (Signed)
Nutrition Brief Note  Patient identified on the Malnutrition Screening Tool (MST) Report  Wt Readings from Last 15 Encounters:  08/30/13 234 lb 5.6 oz (106.3 kg)  08/30/13 234 lb 5.6 oz (106.3 kg)    Body mass index is 36.7 kg/(m^2). Patient meets criteria for obesity, class II based on current BMI.   Current diet order is Heart Healthy/ Carb Modified, patient is consuming approximately 100% of meals at this time. Labs and medications reviewed.   No nutrition interventions warranted at this time. If nutrition issues arise, please consult RD.   Sonji Starkes A. Jimmye Norman, RD, LDN Pager: [phone] After hours Pager: [phone]

## 2013-08-30 NOTE — Progress Notes (Signed)
Received pt from the cath lab alert and oriented. Fr.6 sheath in place on the rt groin. Sheath to be left in place per Dr. Ellyn Hack for blood pressure monitoring. Pt's BP has been soft. With Levophed order if needed.

## 2013-08-30 NOTE — Consult Note (Signed)
PULMONARY / CRITICAL CARE MEDICINE   Name: Tammy Good MRN: [id number] DOB: 31-Jan-1951    ADMISSION DATE:  08/29/2013 CONSULTATION DATE:  08/29/2013  REFERRING MD :  EDP PRIMARY SERVICE: Cards  CHIEF COMPLAINT:  Hypotension, Fever, MI  BRIEF PATIENT DESCRIPTION: 63 y.o. F with DM and hypopituitarism presented with nausea x 3 days and abd pain/diarrhea x 1 day.  In ED, EKG revealed inferior MI, troponin 25.  PCCM consulted for hypotension / fever of 104.  SIGNIFICANT EVENTS / STUDIES:  6/9 EKG >>> inferior MI, troponin 25. 6/9 CT Abd/pelvis >>>  LINES / TUBES: None  CULTURES: Blood 6/9 >>> Urine 6/9 >>>  ANTIBIOTICS: Vanc 6/9 >>> Zosyn 6/9 >>>  HISTORY OF PRESENT ILLNESS:  Tammy Good is a 63 y.o. F with PMH of DM and hypopituitarism (on dexamethasone daily) who presented to ED 6/9 with nausea x 3 days.  On day of presentation she also complained of watery diarrhea and abdominal pain, peri-umbilical region.  Of note, she has known umbilical hernia for which she was supposed to follow up with surgery.  Prior to nausea onset 3 days ago, she was in her USOH. In ED, she was mildly hypotensive (SBP low 90's), EKG revealed inferior MI with troponin of 25.  She was seen by cardiology who are planning on taking her to cath lab.  In ED, she also had fever to 104, CXR was non-concerning.  PCCM was consulted for fever and hypotension. She denies chest pain, SOB, syncope, chills/sweats.  PAST MEDICAL HISTORY :  Past Medical History  Diagnosis Date  . Diabetes mellitus   . Thyroid disease    Past Surgical History  Procedure Laterality Date  . Pituitary surgery     Prior to Admission medications   Medication Sig Start Date End Date Taking? Authorizing Provider  dexamethasone (DECADRON) 1 MG tablet Take 0.5 mg by mouth 2 (two) times daily with a meal.     Historical Provider, MD  insulin glargine (LANTUS) 100 UNIT/ML injection Inject 42 Units into the skin at bedtime.      Historical  Provider, MD  insulin lispro (HUMALOG) 100 UNIT/ML injection Inject into the skin 3 (three) times daily before meals.      Historical Provider, MD  INVOKANA 300 MG TABS  08/23/13   Historical Provider, MD  levothyroxine (SYNTHROID, LEVOTHROID) 125 MCG tablet Take 175 mcg by mouth daily.      Historical Provider, MD  lisinopril (PRINIVIL,ZESTRIL) 5 MG tablet  08/23/13   Historical Provider, MD   No Known Allergies  FAMILY HISTORY:  No family history on file. SOCIAL HISTORY:  reports that she has quit smoking. She does not have any smokeless tobacco history on file. She reports that she does not drink alcohol or use illicit drugs.  REVIEW OF SYSTEMS:  All negative; except for those that are bolded, which indicate positives.  Constitutional: weight loss, weight gain, night sweats, fevers, chills, fatigue, weakness.  HEENT: headaches, sore throat, sneezing, nasal congestion, post nasal drip, difficulty swallowing, tooth/dental problems, visual complaints, visual changes, ear aches. Neuro: difficulty with speech, weakness, numbness, ataxia. CV:  chest pain, orthopnea, PND, swelling in lower extremities, dizziness, palpitations, syncope.  Resp: cough, hemoptysis, dyspnea, wheezing. GI  heartburn, indigestion, abdominal pain, nausea, vomiting, diarrhea, constipation, change in bowel habits, loss of appetite, hematemesis, melena, hematochezia.  GU: dysuria, change in color of urine, urgency or frequency, flank pain, hematuria. MSK: joint pain or swelling, decreased range of motion. Psych: change in mood  or affect, depression, anxiety, suicidal ideations, homicidal ideations. Skin: rash, itching, bruising.   SUBJECTIVE:   VITAL SIGNS: Temp:  [98.4 F (36.9 C)-103.9 F (39.9 C)] 103.9 F (39.9 C) (06/08 2310) Pulse Rate:  [35-124] 100 (06/08 2353) Resp:  [13-34] 13 (06/09 0000) BP: (85-127)/(51-101) 95/72 mmHg (06/09 0000) SpO2:  [85 %-100 %] 100 % (06/08 2353) HEMODYNAMICS:   VENTILATOR  SETTINGS:   INTAKE / OUTPUT: Intake/Output     06/08 0701 - 06/09 0700   I.V. 100   Total Intake 100   Net +100         PHYSICAL EXAMINATION: General: Obese female, resting in stretcher, in NAD. Neuro: A&O x 3, non-focal.  HEENT: [place]/AT. PERRL, sclerae anicteric. Cardiovascular: RRR, no M/R/G appreciated with respect to body habitus. Lungs: Respirations even and unlabored.  CTA bilaterally, No W/R/R. Abdomen: BS x 4, soft, NT/ND.  Umbilical hernia noted. Musculoskeletal: No gross deformities, no edema.  Skin: Intact, warm, no rashes.    LABS:  CBC  Recent Labs Lab 08/29/13 2300 08/29/13 2326  WBC 14.4*  --   HGB 13.0 15.6*  HCT 40.8 46.0  PLT 399  --    Coag's  Recent Labs Lab 08/29/13 2315  INR 1.22   BMET  Recent Labs Lab 08/29/13 2300 08/29/13 2326  NA 139 142  K 3.9 3.7  CL 97 97  CO2 24  --   BUN 10 9  CREATININE 0.96 1.10  GLUCOSE 153* 154*   Electrolytes  Recent Labs Lab 08/29/13 2300  CALCIUM 9.1   Sepsis Markers  Recent Labs Lab 08/29/13 2326  LATICACIDVEN 2.56*   ABG No results found for this basename: PHART, PCO2ART, PO2ART,  in the last 168 hours Liver Enzymes  Recent Labs Lab 08/29/13 2300  AST 182*  ALT 40*  ALKPHOS 80  BILITOT 1.1  ALBUMIN 3.3*   Cardiac Enzymes No results found for this basename: TROPONINI, PROBNP,  in the last 168 hours Glucose No results found for this basename: GLUCAP,  in the last 168 hours  Imaging No results found.   IMAGING: Dg Chest Portable 1 View  08/30/2013   CLINICAL DATA:  Abdominal pain and cough.  Code STEMI.  EXAM: PORTABLE CHEST - 1 VIEW  COMPARISON:  None.  FINDINGS: The heart is enlarged. There is mild vascular congestion without infiltrates or overt failure. No pneumothorax or osseous lesion. Midline trachea.  IMPRESSION: Cardiomegaly, no active disease.   Electronically Signed   By: Rolla Flatten M.D.   On: 08/30/2013 00:14   CT ABD/PELVIS:  Pending.  ASSESSMENT /  PLAN:  PULMONARY A: At risk resp failure P:   - IS / pulmonary hygiene. Supp O2  CARDIOVASCULAR A:  STEMI - initial troponin 25. Hypotension - likely cardiogenic and adrenal insuff, doubt septic P:  - Cardiology following and planning to take to cath lab tonight. - Hold home lisinopril. - Stress steroids. - Manage per cards.  RENAL A:  No acute issues. P:   - 1/2 NS at 100. - BMP in AM.  GASTROINTESTINAL A:  Abd pain Nausea P:   - NPO for now. - f/u abd/pelv CT. - Pantoprazole.  HEMATOLOGIC A:  No acute issues. P:  - VTE proph likely not needed as will likely be on full dose anticoagulation following cath lab. - CBC in AM.  INFECTIOUS A:  Fever - unclear etiology at this point.  ? Infection (possible abdominal source) vs adrenal insufficiency. P:   - f/u abd/pelv  CT. - Empiric vanc/zosyn. - PCT algorithm. - Monitor fever / WBC's.  ENDOCRINE A:  Hypopituitarism Hypothyroidism DM Concern for AI P:   - Stress dose steroids. - Resume levothyroxine at 87.60mcg daily (1/2 home dose). - CBG's q4hr. - Resume lantus at 25u qhs (~1/2 home dose). - Resistant SSI. - Rounding team to please notify Dr. Forde Dandy (endocrinology) of admission.  NEUROLOGIC A:  No acute issues. P:   - Monitor.   Montey Hora, PA - [facility] [place] [place] [facility] Pgr: [phone]  or [phone]   I have personally obtained a history, examined the patient, evaluated laboratory and imaging results, formulated the assessment and plan and placed orders. CRITICAL CARE: The patient is critically ill with multiple organ systems failure and requires high complexity decision making for assessment and support, frequent evaluation and titration of therapies, application of advanced monitoring technologies and extensive interpretation of multiple databases. Critical Care Time devoted to patient care services described in this note is 40 minutes.   Discussed with Dr Ellyn Hack  and Dr Marigene Ehlers  Merton Border, MD ; [facility] [phone].  After 5:30 PM or weekends, call [phone] Pulmonary and [place] Pager: [phone]  08/30/2013, 12:13 AM

## 2013-08-30 NOTE — ED Notes (Signed)
To CT

## 2013-08-30 NOTE — Progress Notes (Signed)
Chaplin received page from ED. Chaplin spoke with family members who requested prayer. Chaplin prayed for both pt and family. Family denied any other services needed.   Tammy Good, [place]

## 2013-08-30 NOTE — Progress Notes (Signed)
DAILY PROGRESS NOTE  Subjective:  Feels better today. Periumbilical pain has improved. CT did not show an acute process. Inferior STEMI overnight - s/p DES to the RCA. She was febrile to 103.9, but has defervesced. Troponin was elevated to 25.98. Venous lactate is 2.56.  ST elevation persists - no chest pain.  Objective:  Temp:  [97.9 F (36.6 C)-103.9 F (39.9 C)] 97.9 F (36.6 C) (06/09 0742) Pulse Rate:  [35-124] 76 (06/09 0742) Resp:  [0-34] 15 (06/09 0742) BP: (82-127)/(25-101) 96/70 mmHg (06/09 0742) SpO2:  [85 %-100 %] 97 % (06/09 0742) Arterial Line BP: (91-105)/(47-53) 94/49 mmHg (06/09 0430) Weight:  [234 lb 5.6 oz (106.3 kg)] 234 lb 5.6 oz (106.3 kg) (06/09 0300) Weight change:   Intake/Output from previous day: 06/08 0701 - 06/09 0700 In: 1007.8 [I.V.:457.8; IV Piggyback:550] Out: 900 [Urine:900]  Intake/Output from this shift:    Medications: Current Facility-Administered Medications  Medication Dose Route Frequency Provider Last Rate Last Dose  . 0.45 % sodium chloride infusion   Intravenous Continuous Rahul P Desai, PA-C      . 0.9 %  sodium chloride infusion   Intravenous Continuous Leonie Man, MD 150 mL/hr at 08/30/13 0302    . 0.9 %  sodium chloride infusion  250 mL Intravenous PRN Leonie Man, MD      . guaiFENesin-dextromethorphan [facility] DM) 100-10 MG/5ML syrup 15 mL  15 mL Oral Q4H PRN Leonie Man, MD      . heparin injection 5,000 Units  5,000 Units Subcutaneous 3 times per day Leonie Man, MD   5,000 Units at 08/30/13 [phone]  . hydrocortisone sodium succinate (SOLU-CORTEF) 100 MG injection 50 mg  50 mg Intravenous Q6H Rahul P Desai, PA-C   50 mg at 08/30/13 1829  . insulin aspart (novoLOG) injection 0-20 Units  0-20 Units Subcutaneous 6 times per day Rahul P Desai, PA-C      . insulin glargine (LANTUS) injection 25 Units  25 Units Subcutaneous QHS Rahul P Desai, PA-C      . levothyroxine (SYNTHROID, LEVOTHROID) injection 87.5 mcg   87.5 mcg Intravenous Daily Rahul P Desai, PA-C      . morphine 2 MG/ML injection 2 mg  2 mg Intravenous Q1H PRN Leonie Man, MD      . norepinephrine (LEVOPHED) 4 mg in dextrose 5 % 250 mL infusion  2-50 mcg/min Intravenous Titrated Leonie Man, MD 18.8 mL/hr at 08/30/13 0615 5 mcg/min at 08/30/13 0615  . pantoprazole (PROTONIX) injection 40 mg  40 mg Intravenous Q24H Rahul P Desai, PA-C   40 mg at 08/30/13 0431  . piperacillin-tazobactam (ZOSYN) IVPB 3.375 g  3.375 g Intravenous 3 times per day Wilhelmina Mcardle, MD   3.375 g at 08/30/13 0615  . sodium chloride 0.9 % injection 3 mL  3 mL Intravenous Q12H Leonie Man, MD      . sodium chloride 0.9 % injection 3 mL  3 mL Intravenous PRN Leonie Man, MD      . ticagrelor [facility]) tablet 90 mg  90 mg Oral BID Leonie Man, MD      . tirofiban (AGGRASTAT) infusion 50 mcg/mL 100 mL  0.15 mcg/kg/min Intravenous Continuous Leonie Man, MD 19.1 mL/hr at 08/30/13 0422 0.15 mcg/kg/min at 08/30/13 0422  . vancomycin (VANCOCIN) 1,250 mg in sodium chloride 0.9 % 250 mL IVPB  1,250 mg Intravenous Q12H Wilhelmina Mcardle, MD  Physical Exam: General appearance: alert and no distress Neck: no carotid bruit and no JVD Lungs: clear to auscultation bilaterally Heart: regular rate and rhythm, S1, S2 normal, no murmur, click, rub or gallop Abdomen: soft, mild TTP with deep palpation around the umbilicus Extremities: extremities normal, atraumatic, no cyanosis or edema Pulses: 2+ and symmetric Skin: Skin color, texture, turgor normal. No rashes or lesions Neurologic: Grossly normal Psych: Normal  Lab Results: Results for orders placed during the hospital encounter of 08/29/13 (from the past 48 hour(s))  CBC WITH DIFFERENTIAL     Status: Abnormal   Collection Time    08/29/13 11:00 PM      Result Value Ref Range   WBC 14.4 (*) 4.0 - 10.5 K/uL   RBC 4.66  3.87 - 5.11 MIL/uL   Hemoglobin 13.0  12.0 - 15.0 g/dL   HCT 40.8  36.0 - 46.0  %   MCV 87.6  78.0 - 100.0 fL   MCH 27.9  26.0 - 34.0 pg   MCHC 31.9  30.0 - 36.0 g/dL   RDW 15.0  11.5 - 15.5 %   Platelets 399  150 - 400 K/uL   Neutrophils Relative % 73  43 - 77 %   Neutro Abs 10.4 (*) 1.7 - 7.7 K/uL   Lymphocytes Relative 16  12 - 46 %   Lymphs Abs 2.4  0.7 - 4.0 K/uL   Monocytes Relative 10  3 - 12 %   Monocytes Absolute 1.5 (*) 0.1 - 1.0 K/uL   Eosinophils Relative 0  0 - 5 %   Eosinophils Absolute 0.0  0.0 - 0.7 K/uL   Basophils Relative 1  0 - 1 %   Basophils Absolute 0.1  0.0 - 0.1 K/uL  COMPREHENSIVE METABOLIC PANEL     Status: Abnormal   Collection Time    08/29/13 11:00 PM      Result Value Ref Range   Sodium 139  137 - 147 mEq/L   Potassium 3.9  3.7 - 5.3 mEq/L   Chloride 97  96 - 112 mEq/L   CO2 24  19 - 32 mEq/L   Glucose, Bld 153 (*) 70 - 99 mg/dL   BUN 10  6 - 23 mg/dL   Creatinine, Ser 0.96  0.50 - 1.10 mg/dL   Calcium 9.1  8.4 - 10.5 mg/dL   Total Protein 7.9  6.0 - 8.3 g/dL   Albumin 3.3 (*) 3.5 - 5.2 g/dL   AST 182 (*) 0 - 37 U/L   ALT 40 (*) 0 - 35 U/L   Alkaline Phosphatase 80  39 - 117 U/L   Total Bilirubin 1.1  0.3 - 1.2 mg/dL   GFR calc non Af Amer 63 (*) >90 mL/min   GFR calc Af Amer 72 (*) >90 mL/min   Comment: (NOTE)     The eGFR has been calculated using the CKD EPI equation.     This calculation has not been validated in all clinical situations.     eGFR's persistently <90 mL/min signify possible Chronic Kidney     Disease.  PROTIME-INR     Status: None   Collection Time    08/29/13 11:15 PM      Result Value Ref Range   Prothrombin Time 15.1  11.6 - 15.2 seconds   INR 1.22  0.00 - 1.49  MAGNESIUM     Status: None   Collection Time    08/29/13 11:16 PM      Result  Value Ref Range   Magnesium 2.1  1.5 - 2.5 mg/dL  CBG MONITORING, ED     Status: Abnormal   Collection Time    08/29/13 11:20 PM      Result Value Ref Range   Glucose-Capillary 133 (*) 70 - 99 mg/dL  I-STAT CHEM 8, ED     Status: Abnormal   Collection  Time    08/29/13 11:26 PM      Result Value Ref Range   Sodium 142  137 - 147 mEq/L   Potassium 3.7  3.7 - 5.3 mEq/L   Chloride 97  96 - 112 mEq/L   BUN 9  6 - 23 mg/dL   Creatinine, Ser 1.10  0.50 - 1.10 mg/dL   Glucose, Bld 154 (*) 70 - 99 mg/dL   Calcium, Ion 1.14  1.13 - 1.30 mmol/L   TCO2 26  0 - 100 mmol/L   Hemoglobin 15.6 (*) 12.0 - 15.0 g/dL   HCT 46.0  36.0 - 46.0 %  I-STAT CG4 LACTIC ACID, ED     Status: Abnormal   Collection Time    08/29/13 11:26 PM      Result Value Ref Range   Lactic Acid, Venous 2.56 (*) 0.5 - 2.2 mmol/L  I-STAT TROPOININ, ED     Status: Abnormal   Collection Time    08/29/13 11:30 PM      Result Value Ref Range   Troponin i, poc 25.98 (*) 0.00 - 0.08 ng/mL   Comment NOTIFIED PHYSICIAN     Comment 3            Comment: Due to the release kinetics of cTnI,     a negative result within the first hours     of the onset of symptoms does not rule out     myocardial infarction with certainty.     If myocardial infarction is still suspected,     repeat the test at appropriate intervals.  PROCALCITONIN     Status: None   Collection Time    08/30/13 12:33 AM      Result Value Ref Range   Procalcitonin 0.21     Comment:            Interpretation:     PCT (Procalcitonin) <= 0.5 ng/mL:     Systemic infection (sepsis) is not likely.     Local bacterial infection is possible.     (NOTE)             ICU PCT Algorithm               Non ICU PCT Algorithm        ----------------------------     ------------------------------             PCT < 0.25 ng/mL                 PCT < 0.1 ng/mL         Stopping of antibiotics            Stopping of antibiotics           strongly encouraged.               strongly encouraged.        ----------------------------     ------------------------------           PCT level decrease by               PCT < 0.25 ng/mL           >=  80% from peak PCT           OR PCT 0.25 - 0.5 ng/mL          Stopping of antibiotics                                                  encouraged.         Stopping of antibiotics               encouraged.        ----------------------------     ------------------------------           PCT level decrease by              PCT >= 0.25 ng/mL           < 80% from peak PCT            AND PCT >= 0.5 ng/mL            Continuing antibiotics                                                  encouraged.           Continuing antibiotics                encouraged.        ----------------------------     ------------------------------         PCT level increase compared          PCT > 0.5 ng/mL             with peak PCT AND              PCT >= 0.5 ng/mL             Escalation of antibiotics                                              strongly encouraged.          Escalation of antibiotics            strongly encouraged.  MRSA PCR SCREENING     Status: None   Collection Time    08/30/13  2:54 AM      Result Value Ref Range   MRSA by PCR NEGATIVE  NEGATIVE   Comment:            The GeneXpert MRSA Assay (FDA     approved for NASAL specimens     only), is one component of a     comprehensive MRSA colonization     surveillance program. It is not     intended to diagnose MRSA     infection nor to guide or     monitor treatment for     MRSA infections.    Imaging: Ct Abdomen Pelvis W Contrast  08/30/2013   CLINICAL DATA:  Abdominal pain and nausea. Hernia. Diarrhea. Symptoms beginning on Saturday.  EXAM: CT ABDOMEN AND PELVIS WITH CONTRAST  TECHNIQUE: Multidetector CT imaging of the abdomen and pelvis was performed using  the standard protocol following bolus administration of intravenous contrast.  CONTRAST:  100 mL Isovue-300  COMPARISON:  06/15/2009  FINDINGS: Atelectasis in the lung bases.  Cardiac enlargement.  Diffuse fatty infiltration of the liver. Surgical absence of the gallbladder. Spleen is unremarkable with small accessory spleen. The pancreas, adrenal glands, kidneys, inferior vena  cava, and retroperitoneal lymph nodes are unremarkable. Calcification of the abdominal aorta without aneurysm. Stomach and small bowel are unremarkable for degree of distention. Colon is decompressed. No free air or free fluid in the abdomen. There is a midline abdominal wall hernia, likely periumbilical, containing fat with infiltration consistent with fat necrosis. This is enlarging since previous study.  Pelvis: Appendix is normal. Small fat containing inguinal hernias. Uterus and ovaries are not enlarged. No pelvic mass or lymphadenopathy. No diverticulitis. Degenerative changes in the lumbar spine. No destructive bone lesions appreciated.  IMPRESSION: Mid abdominal periumbilical hernia containing fat with changes of fat necrosis, increasing since prior study. No bowel herniation or obstruction. Diffuse fatty infiltration of the liver.   Electronically Signed   By: Lucienne Capers M.D.   On: 08/30/2013 00:30   Dg Chest Portable 1 View  08/30/2013   CLINICAL DATA:  Abdominal pain and cough.  Code STEMI.  EXAM: PORTABLE CHEST - 1 VIEW  COMPARISON:  None.  FINDINGS: The heart is enlarged. There is mild vascular congestion without infiltrates or overt failure. No pneumothorax or osseous lesion. Midline trachea.  IMPRESSION: Cardiomegaly, no active disease.   Electronically Signed   By: Rolla Flatten M.D.   On: 08/30/2013 00:14    Assessment:  Active Problems:   ST elevation myocardial infarction (STEMI) of inferior wall   IDDM (insulin dependent diabetes mellitus)   Panhypopituitarism   Fever   Plan:  1. Inferior STEMI - no chest pain currently. ST segment changes persist - remains on aggrestat, Brillinta and aspirin. No evidence for groin hematoma. Fever has resolved - on stress dose steroids and antibiotics - no clear source of infection. Procalcitonin is not elevated - lactate elevation explainable by STEMI. Will check 2D echo this morning and repeat 12 lead EKG. Bedrest today. Can remove the leg  immobilizer. Appreciate PCCM recommendations regarding antibiotics - I'm inclined to discontinue them.  Check ESR. May be able to back off on stress dose steroids.  Time Spent Directly with Patient:  30 minutes  Length of Stay:  LOS: 1 day   Pixie Casino, MD, [facility] Attending Cardiologist [place] 08/30/2013, 8:12 AM   \

## 2013-08-30 NOTE — Progress Notes (Signed)
Pt noted to be restless unable to keep the rt leg straight. Knee immobilizer placed.Still pt is restless and the groin started to bleed. Pressure held for 20 mins and thrombi pad was placed. Dr. Aundra Dubin notified and ordered Xanax 0.5mg ;given. He was also informed that groin is bleeding and ordered to remove sheath since blood pressure is better.

## 2013-08-30 NOTE — ED Provider Notes (Signed)
CSN: [id number]     Arrival date & time 08/29/13  2231 History   First MD Initiated Contact with Patient 08/29/13 2309     Chief Complaint  Patient presents with  . Abdominal Pain  . Shortness of Breath  . Code Sepsis  . Code STEMI     (Consider location/radiation/quality/duration/timing/severity/associated sxs/prior Treatment) HPI 63-year-old female presents to the emergency room from home with complaint of abdominal pain nausea with dry heaving but no vomiting and diarrhea.  Symptoms started on Saturday.  Patient complains of diffuse abdominal pain.  Patient has a known umbilical hernia and she is awaiting a visit to the surgery office to schedule for repair.  She denies known fever.  She has not been needing or drinking well for the last 2 days, and has been staying mainly in bed.  No known sick contacts, no recent antibiotics.  Patient has history of pituitary surgery is on chronic steroids diabetes and hypothyroidism. Past Medical History  Diagnosis Date  . Diabetes mellitus   . Thyroid disease    Past Surgical History  Procedure Laterality Date  . Pituitary surgery     No family history on file. History  Substance Use Topics  . Smoking status: Former Research scientist (life sciences)  . Smokeless tobacco: Not on file  . Alcohol Use: No   OB History   Grav Para Term Preterm Abortions TAB SAB Ect Mult Living                 Review of Systems   See History of Present Illness; otherwise all other systems are reviewed and negative  Allergies  Review of patient's allergies indicates no known allergies.  Home Medications   Prior to Admission medications   Medication Sig Start Date End Date Taking? Authorizing Provider  dexamethasone (DECADRON) 1 MG tablet Take 0.5 mg by mouth 2 (two) times daily with a meal.     Historical Provider, MD  insulin glargine (LANTUS) 100 UNIT/ML injection Inject 42 Units into the skin at bedtime.      Historical Provider, MD  insulin lispro (HUMALOG) 100 UNIT/ML  injection Inject into the skin 3 (three) times daily before meals.      Historical Provider, MD  INVOKANA 300 MG TABS  08/23/13   Historical Provider, MD  levothyroxine (SYNTHROID, LEVOTHROID) 125 MCG tablet Take 175 mcg by mouth daily.      Historical Provider, MD  lisinopril (PRINIVIL,ZESTRIL) 5 MG tablet  08/23/13   Historical Provider, MD   BP 97/61  Pulse 94  Temp(Src) 103.9 F (39.9 C) (Oral)  Resp 24  SpO2 99% Physical Exam  Nursing note and vitals reviewed. Constitutional: She is oriented to person, place, and time. She appears well-developed and well-nourished.  Obese female, uncomfortable appearing patient is febrile and hypotensive upon arrival  HENT:  Head: Normocephalic and atraumatic.  Right Ear: External ear normal.  Left Ear: External ear normal.  Nose: Nose normal.  Mouth/Throat: Oropharynx is clear and moist.  Eyes: Conjunctivae and EOM are normal. Pupils are equal, round, and reactive to light.  Neck: Normal range of motion. Neck supple. No JVD present. No tracheal deviation present. No thyromegaly present.  Cardiovascular: Regular rhythm, normal heart sounds and intact distal pulses.  Exam reveals no gallop and no friction rub.   No murmur heard. Tachycardia noted  Pulmonary/Chest: Effort normal and breath sounds normal. No stridor. No respiratory distress. She has no wheezes. She has no rales. She exhibits no tenderness.  Abdominal: Soft. Bowel sounds  are normal. She exhibits no distension and no mass. There is no tenderness. There is no rebound and no guarding.   Patient has a reducible umbilical hernia that is tender to palpation. Her bowel sounds are hyperactive.  She has diffuse tenderness to palpation throughout the abdomen.  Musculoskeletal: Normal range of motion. She exhibits edema. She exhibits no tenderness.  Lymphadenopathy:    She has no cervical adenopathy.  Neurological: She is alert and oriented to person, place, and time. She exhibits normal muscle  tone. Coordination normal.  Skin: Skin is warm and dry. No rash noted. No erythema. No pallor.  Psychiatric: She has a normal mood and affect. Her behavior is normal. Judgment and thought content normal.    ED Course  Procedures (including critical care time)  CRITICAL CARE Performed by: Kalman Drape Total critical care time: 60 min Critical care time was exclusive of separately billable procedures and treating other patients. Critical care was necessary to treat or prevent imminent or life-threatening deterioration. Critical care was time spent personally by me on the following activities: development of treatment plan with patient and/or surrogate as well as nursing, discussions with consultants, evaluation of patient's response to treatment, examination of patient, obtaining history from patient or surrogate, ordering and performing treatments and interventions, ordering and review of laboratory studies, ordering and review of radiographic studies, pulse oximetry and re-evaluation of patient's condition.  Labs Review Labs Reviewed  CBC WITH DIFFERENTIAL - Abnormal; Notable for the following:    WBC 14.4 (*)    Neutro Abs 10.4 (*)    Monocytes Absolute 1.5 (*)    All other components within normal limits  COMPREHENSIVE METABOLIC PANEL - Abnormal; Notable for the following:    Glucose, Bld 153 (*)    Albumin 3.3 (*)    AST 182 (*)    ALT 40 (*)    GFR calc non Af Amer 63 (*)    GFR calc Af Amer 72 (*)    All other components within normal limits  I-STAT CHEM 8, ED - Abnormal; Notable for the following:    Glucose, Bld 154 (*)    Hemoglobin 15.6 (*)    All other components within normal limits  I-STAT CG4 LACTIC ACID, ED - Abnormal; Notable for the following:    Lactic Acid, Venous 2.56 (*)    All other components within normal limits  I-STAT TROPOININ, ED - Abnormal; Notable for the following:    Troponin i, poc 25.98 (*)    All other components within normal limits  CBG  MONITORING, ED - Abnormal; Notable for the following:    Glucose-Capillary 133 (*)    All other components within normal limits  CULTURE, BLOOD (ROUTINE X 2)  CULTURE, BLOOD (ROUTINE X 2)  URINE CULTURE  PROTIME-INR  URINALYSIS, ROUTINE W REFLEX MICROSCOPIC  MAGNESIUM  GI PATHOGEN PANEL BY PCR, STOOL    Imaging Review Ct Abdomen Pelvis W Contrast  08/30/2013   CLINICAL DATA:  Abdominal pain and nausea. Hernia. Diarrhea. Symptoms beginning on Saturday.  EXAM: CT ABDOMEN AND PELVIS WITH CONTRAST  TECHNIQUE: Multidetector CT imaging of the abdomen and pelvis was performed using the standard protocol following bolus administration of intravenous contrast.  CONTRAST:  100 mL Isovue-300  COMPARISON:  06/15/2009  FINDINGS: Atelectasis in the lung bases.  Cardiac enlargement.  Diffuse fatty infiltration of the liver. Surgical absence of the gallbladder. Spleen is unremarkable with small accessory spleen. The pancreas, adrenal glands, kidneys, inferior vena cava, and retroperitoneal  lymph nodes are unremarkable. Calcification of the abdominal aorta without aneurysm. Stomach and small bowel are unremarkable for degree of distention. Colon is decompressed. No free air or free fluid in the abdomen. There is a midline abdominal wall hernia, likely periumbilical, containing fat with infiltration consistent with fat necrosis. This is enlarging since previous study.  Pelvis: Appendix is normal. Small fat containing inguinal hernias. Uterus and ovaries are not enlarged. No pelvic mass or lymphadenopathy. No diverticulitis. Degenerative changes in the lumbar spine. No destructive bone lesions appreciated.  IMPRESSION: Mid abdominal periumbilical hernia containing fat with changes of fat necrosis, increasing since prior study. No bowel herniation or obstruction. Diffuse fatty infiltration of the liver.   Electronically Signed   By: Lucienne Capers M.D.   On: 08/30/2013 00:30   Dg Chest Portable 1 View  08/30/2013    CLINICAL DATA:  Abdominal pain and cough.  Code STEMI.  EXAM: PORTABLE CHEST - 1 VIEW  COMPARISON:  None.  FINDINGS: The heart is enlarged. There is mild vascular congestion without infiltrates or overt failure. No pneumothorax or osseous lesion. Midline trachea.  IMPRESSION: Cardiomegaly, no active disease.   Electronically Signed   By: Rolla Flatten M.D.   On: 08/30/2013 00:14     EKG Interpretation   Date/Time:  Monday August 29 2013 23:39:11 EDT Ventricular Rate:  100 PR Interval:  166 QRS Duration: 89 QT Interval:  371 QTC Calculation: 478 R Axis:   -167 Text Interpretation:  Sinus tachycardia Probable right ventricular  hypertrophy Inferior infarct, acute (RCA) Lateral leads are also involved  Probable RV involvement, suggest recording right precordial leads ** **  ACUTE MI / STEMI ** ** Confirmed by Alvenia Treese  MD, Jackson Coffield ([id number]) on 08/30/2013  12:03:23 AM      MDM   Final diagnoses:  ST elevation myocardial infarction (STEMI) of inferior wall  Sepsis    63 year old female who presents with fever hypotension and a diarrheal illness for 3 days found to have acute ST elevations in inferior lateral leads.  Patient identified as code sepsis and can STEMI.  Critical care and cardiology involved.  Patient with elevated troponin of 25.  She has received Tylenol, aspirin, heparin.  Patient is on chronic steroids for history of pituitary surgery.  She's been given stress dose of hydrocortisone.  Family has been updated, [place] and spoken with them.  Patient to receive CT abdomen pelvis with contrast IV only prior to going to the Cath Lab for PCI.  Patient then to go to the ICU.    Kalman Drape, MD 08/30/13 [phone]

## 2013-08-30 NOTE — CV Procedure (Signed)
CARDIAC CATHETERIZATION PERCUTANEOUS CORONARY INTERVENTION REPORT  NAME:  Tammy Good   MRN: [id number] DOB:  07/03/1950   ADMIT DATE: 08/29/2013 Procedure Date: 08/30/2013  INTERVENTIONAL CARDIOLOGIST: Leonie Man, M.D., MS PRIMARY CARE PROVIDER: No primary provider on file. PRIMARY CARDIOLOGIST: New to [facility]  PATIENT:  Tammy Good is a 63 y.o. 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 She's had gagging but no true emesis. She's had fevers and chills/Reiter's with a temperature of 103.9 emergency room. She has a history of an umbilical hernia and has significant abdominal pain in the umbilical region. Upon presentation to the emergency room she was febrile, hypotensive with blood pressures in the 80s and tachycardic 110s. He has a white count elevation, but also EKG demonstrated significant roughly 3 mm inferior lead ST elevations with lateral depressions. Also mild ST elevations in V 4 through V6. She has not had any chest tightness or pressure. Mild dyspnea and nausea only. In the emergency room her blood pressure responded relatively well to normal saline boluses, but she still remains borderline hypotensive. Due to the copied nature of her presentation, both cardiology and critical care were called by the emergency room physician. The decision was for her to undergo abdominal ET scan to rule out an acute abdomen prior to taking to the Cath Lab for possible subacute inferior STEMI. Transit time to the Cath Lab was therefore delayed by at least 45 minutes if not an hour.  PRE-OPERATIVE DIAGNOSIS:    Subacute Inferior STEMI  Shock - likely combined etiology of possible cardiac and septic  PROCEDURES PERFORMED:    Left Heart Catheterization with Native Coronary Angiography  via Right Common Femoral Artery    Successful, difficult PCI of a 100% thrombotic occluded early mid RCA with a long Integrity Resolute DES 3.0 mm x 38 mm -- postdilated to 3.35 mm  Successful balloon angioplasty of the right posterior AV groove branch  PROCEDURE: The patient was brought to the [facility] in the fasting state and prepped and draped in the usual sterile fashion for Right Common Femoral artery access. Due to the patient's hypotension and very difficult to palpate pulses on the right wrist, initial plans for radial access were abandoned as the pulse was not adequately palpable. Also there was concern with hypotension and administering the radial cocktail.  Sterile technique was used including antiseptics, cap, gloves, gown, hand hygiene, mask and sheet. Skin prep: Chlorhexidine.   Consent: Risks of procedure as well as the alternatives and risks of each were explained to the (patient/caregiver). Emergency consent was implied. Signed consent was not obtained due to the emergent nature of the procedure.  Time Out: Verified patient identification, verified procedure, site/side was marked, verified correct patient position, special equipment/implants available, medications/allergies/relevent history reviewed, required imaging and test results available. Performed.  Access:   Right Common Femoral Artery: 6 Fr Sheath -  fluoroscopically guided modified Seldinger Technique    Left Heart Catheterization: 5 Fr Catheters advanced or exchanged over a J-wire;   Left Coronary Artery Cineangiography: JL4 Catheter  Right Coronary Artery: JR 4 Catheter   LV Hemodynamics (LV Gram): Angled Pigtail  Sheath removed in the CCU with manual pressure for hemostasis.   FINDINGS:  Hemodynamics:   Central Aortic Pressure / Mean: 91/45/62 mmHg  Left Ventricular Pressure /  LVEDP:  88/10/11 mm Hg  Left Ventriculography:  EF: 55-60 %  Wall Motion: Moderate to severe basal to mid inferior  hypokinesis  Coronary Anatomy: Right Dominant  Left Main: Moderate caliber vessel that bifurcates into the LAD and Circumflex, mild calcification, but otherwise angiographically normal. LAD: Moderate caliber, somewhat tortuous vessel it tapers into a relatively small vessel none the apex. There is several septal perforators with one major diagonal branch. The diagonal branch comes in the mid vessel and has 2 major branches. The inferior branch has a roughly 80% stenosis in a less than 2 mm vessel. The LAD itself has mild diffuse disease.  Left Circumflex: Moderate caliber vessel is extremely tortuous in nature. It gives rise to a small AV groove branch that essentially courses as a lateral OM. In the early mid AV groove is roughly 40% stenosis prior to the branch point to AV groove and OM   RCA: Large-caliber, likely dominant vessel that is 100% occluded just after a first atrial branch (early mid). The SA nodal artery appears to be occluded. The lesion appears subacute with appears to be old thrombus. Beyond the initial occlusion there is an extensive area of diffuse disease roughly 30-35 mm,  Post angioplasty angiography revealed a small but extensive right posterior descending artery and Right Posterior AV Groove Branch (RPAV) that gives off 2 small  RPL branches. The inferior/first branch is the occluded at the ostium with what appears to be an embolic thrombus.  After reviewing the initial angiography, the culprit lesion was thought to be the 100% thrombotic occluded early mid RCA.  Preparation were made to proceed with PCI on this lesion.  Percutaneous Coronary Intervention:  Sheath exchanged for 6 Fr  Lesion #1: 100% thrombotic occluded early mid RCA.  -- Reduced to 0% stenosis.  TIMI 0 flow pre-PCI -- TIMI-3 flow post PCI  Guide: 6 Fr   JR 4 Guidewire: Prowater - wire passed somewhat easily into the mid portion of the vessel, but was more difficult to get down beyond the bifurcation  distally. Predilation Balloon: Euphora 2.0 mm x 15 mm; multiple inflations  10 Atm x 30 Sec - throughout the entire mid RCA Stent: Integrity Resolute 3.0 mm x 38 mm;   Max inflation: 16 Atm x 45 Sec, - final diameter 3.35 mm Following stent placement, the posterior lateral system continue to be diffusely occluded at what is likely embolic thrombus.  At this point, Aggrastat bolus and infusion was administered. This is was made to proceed with balloon angioplasty of the posterior AV groove branch that appeared to be 100% occluded.  Lesion #2 proximal RPAV 323% thrombotic/embolic occlusion  Predilation Balloon: Euphora 2.0 mm x 15 mm; multiple inflations  6 Atm x 30 Sec x 2 inflations  Post deployment angiography in multiple views, with and without guidewire in place revealed excellent stent deployment and lesion coverage.  There was no evidence of dissection or perforation. One of 2 posterolateral branches had restore flow while the other appeared to be occluded with thrombus.  MEDICATIONS:   ANESTHESIA: Local Lidocaine 18 ml   SEDATION: 1 mg IV Versed,  Premedication: Emergency Room -- 325 mg aspirin, Heparin 4000 Unit bolus; a total of 4 L normal saline  Omnipaque contrast 165  mL   Anticoagulation: Angiomax Bolus & drip  Anti-Platelet Agent:  Brilinta 180 mg; Aggrastat bolus with drip was started near the end of the case to be run for 12 hours post  PATIENT DISPOSITION:  The patient was transferred to the PACU holding area in a hemodynamicaly stable, chest pain free condition.  The patient tolerated the procedure well, and there were no complications.  EBL:   < 10 ml  The patient was stable before, during, and after the procedure.  POST-OPERATIVE DIAGNOSIS:    Severe single-vessel CAD with 100% occluded early mid RCA (with extensive disease beyond the initial occlusion)  Successful PTCA-PCI of the mid RCA using An Integrity Resolute DES stent 3.0 mm x 38  mm  Successful PTCA of the Proximal Right Posterior Lateral Branch  PLAN OF CARE:  Patient will be admitted to the CCU under the care of the PCCM team.  Continue Aggrastat for 12 hours Post PCI for residual thrombus in the distal posterior lateral system  2-D echocardiogram the morning  For borderline hypotension / shock we will initiate Norepinephrine drip to keep MAP>65, SBP>95 mmHg,   Leonie Man, M.D., M.S. [facility] [place]. [place], [place]  [address]  [phone]  08/30/2013 1:53 AM

## 2013-08-30 NOTE — Progress Notes (Addendum)
  Echocardiogram 2D Echocardiogram with Definity has been performed.  Valinda Hoar 08/30/2013, 10:15 AM

## 2013-08-30 NOTE — Progress Notes (Signed)
ANTICOAGULATION CONSULT NOTE - Initial Consult  Pharmacy Consult for aggrastat  Indication: s/p PCI  No Known Allergies  Patient Measurements: Height: 5\' 7"  (170.2 cm) Weight: 234 lb 5.6 oz (106.3 kg) IBW/kg (Calculated) : 61.6 Heparin Dosing Weight:   Vital Signs: Temp: 103.9 F (39.9 C) (06/08 2310) Temp src: Oral (06/08 2310) BP: 92/57 mmHg (06/09 0300) Pulse Rate: 89 (06/09 0300)  Labs:  Recent Labs  08/29/13 2300 08/29/13 2315 08/29/13 2326  HGB 13.0  --  15.6*  HCT 40.8  --  46.0  PLT 399  --   --   LABPROT  --  15.1  --   INR  --  1.22  --   CREATININE 0.96  --  1.10    Estimated Creatinine Clearance: 67.4 ml/min (by C-G formula based on Cr of 1.1).   Medical History: Past Medical History  Diagnosis Date  . Diabetes mellitus   . Thyroid disease     Medications:    Assessment: S/p PCI aggrastat to continue for 12 hours post-cath at 0.15 mcg/kg/min for residual thrombus in distal posterior lateral system. Groin has required thrombi pad for bleeding. Will continue to monitor.  Goal of Therapy:  12 hours of aggrastat post pci     Plan:  aggrastat at 0.15 mcg/kg/min   Curlene Dolphin 08/30/2013,3:41 AM

## 2013-08-30 NOTE — H&P (Addendum)
History and Physical Interval Note:  NAME:  Tammy Good   MRN: [id number] DOB:  1951-02-11   ADMIT DATE: 08/29/2013   08/30/2013 12:24 AM  Tammy Good Tammy Good is a 63 y.o. female  With PMH of DM & Pan-Hypopituitarism. Presented to [facility] ER with ~3 days h/o Nausea, but sever diarrhea with fever today.  Upon evaluation - hypotensive ~85 SBP & tachycardic.  Temp 103.9.  POC Troponin 25 with ~3+ mm STE in II, III, avF (~1-2 mm in V4-6) and reciprocal ST Depression in I & aVL.  She has not had any chest pain - just nausea.  Mild dyspnea. BP improved - now on 4th NS Bolus. See full Consult Note by Dr.McLean.  Past Medical History  Diagnosis Date  . Diabetes mellitus   . Thyroid disease    Past Surgical History  Procedure Laterality Date  . Pituitary surgery     FAMHx: No family history on file.  SOCHx:  reports that she has quit smoking. She does not have any smokeless tobacco history on file. She reports that she does not drink alcohol or use illicit drugs.  ALLERGIES: No Known Allergies  HOME MEDICATIONS: Prescriptions prior to admission  Medication Sig Dispense Refill  . dexamethasone (DECADRON) 1 MG tablet Take 0.5 mg by mouth 2 (two) times daily with a meal.       . insulin glargine (LANTUS) 100 UNIT/ML injection Inject 42 Units into the skin at bedtime.        . insulin lispro (HUMALOG) 100 UNIT/ML injection Inject into the skin 3 (three) times daily before meals.        . INVOKANA 300 MG TABS       . levothyroxine (SYNTHROID, LEVOTHROID) 125 MCG tablet Take 175 mcg by mouth daily.        [name] lisinopril (PRINIVIL,ZESTRIL) 5 MG tablet         PHYSICAL EXAM:Blood pressure 97/61, pulse 94, temperature 103.9 F (39.9 C), temperature source Oral, resp. rate 24, SpO2 99.00%. See full Consult Note by Dr. Aundra Dubin.   Adult ECG Report  Rate: 100 ;  Rhythm: sinus tachycardia  QRS Axis: 193 ? ;  PR Interval: 166 ;  QRS Duration: 89 ; QTc: 478  Voltages: low  Conduction  Disturbances: none  Other Abnormalities: ~3 mmSTE II, III, aVF with reciprocal ST Depressions in I & avL), ~1 mm STE V4-V6.; mild Q waves in II, IIIl, aVF   Narrative Interpretation: Inferior STEMI - Recent with Injury pattern  IMPRESSION & PLAN The patients' history has been reviewed - after detailed discussion with Drs. Aundra Dubin (Fellow on-call) & Jamal Collin (PCCM), the plan was CT Abdomen to rule out acute abdomen first. She will then be taken to the cath lab for Coronary Angiography. I suspect that her infarction in recent, but not acute, but it is impossible to tell since she denies any Angina symptoms.   Due to the confounding presentation with ? Sepsis (Fever, Hypotension) & non-cardiac concerns, her transit time to the cath lab & therefore possible PCI will be delayed by at least 45 min.  Tammy Good has presented today for surgery, with the diagnosis of chest pain The various methods of treatment have been discussed with the patient and family.   Risks / Complications include, but not limited to: Death, MI, CVA/TIA, VF/VT (with defibrillation), Bradycardia (need for temporary pacer placement), contrast induced nephropathy, bleeding / bruising / hematoma / pseudoaneurysm, vascular or coronary injury (with possible emergent  CT or Vascular Surgery), adverse medication reactions, infection.     After consideration of risks, benefits and other options for treatment, the patient has consented to Procedure(s):   LEFT HEART CATHETERIZATION AND CORONARY ANGIOGRAPHY +/- AD [place]  as a surgical intervention.   We will proceed with the planned procedure.   [facility] [place]. [place], [place]  [address]  [phone]  08/30/2013 12:24 AM

## 2013-08-31 ENCOUNTER — Encounter (HOSPITAL_COMMUNITY): Payer: Self-pay | Admitting: Physician Assistant

## 2013-08-31 ENCOUNTER — Encounter (HOSPITAL_COMMUNITY)
Admission: EM | Disposition: A | Discharge status: Home / Self Care | Payer: Self-pay | Source: Home / Self Care | Attending: Pulmonary Disease

## 2013-08-31 DIAGNOSIS — D62 Acute posthemorrhagic anemia: Secondary | ICD-10-CM | POA: Diagnosis present

## 2013-08-31 DIAGNOSIS — K260 Acute duodenal ulcer with hemorrhage: Secondary | ICD-10-CM | POA: Diagnosis not present

## 2013-08-31 DIAGNOSIS — I959 Hypotension, unspecified: Secondary | ICD-10-CM

## 2013-08-31 HISTORY — PX: COLONOSCOPY: SHX5424

## 2013-08-31 LAB — HEMOGLOBIN AND HEMATOCRIT, BLOOD
HCT: 27 % — ABNORMAL LOW (ref 36.0–46.0)
Hemoglobin: 8.5 g/dL — ABNORMAL LOW (ref 12.0–15.0)

## 2013-08-31 LAB — PROCALCITONIN: Procalcitonin: 0.67 ng/mL

## 2013-08-31 LAB — CBC
HCT: 32.9 % — ABNORMAL LOW (ref 36.0–46.0)
Hemoglobin: 10.6 g/dL — ABNORMAL LOW (ref 12.0–15.0)
MCH: 28.1 pg (ref 26.0–34.0)
MCHC: 32.2 g/dL (ref 30.0–36.0)
MCV: 87.3 fL (ref 78.0–100.0)
Platelets: 265 10*3/uL (ref 150–400)
RBC: 3.77 MIL/uL — ABNORMAL LOW (ref 3.87–5.11)
RDW: 14.7 % (ref 11.5–15.5)
WBC: 14.8 10*3/uL — ABNORMAL HIGH (ref 4.0–10.5)

## 2013-08-31 LAB — PREPARE RBC (CROSSMATCH)

## 2013-08-31 LAB — GLUCOSE, CAPILLARY
Glucose-Capillary: 193 mg/dL — ABNORMAL HIGH (ref 70–99)
Glucose-Capillary: 153 mg/dL — ABNORMAL HIGH (ref 70–99)
Glucose-Capillary: 112 mg/dL — ABNORMAL HIGH (ref 70–99)
Glucose-Capillary: 104 mg/dL — ABNORMAL HIGH (ref 70–99)

## 2013-08-31 LAB — ABO/RH: ABO/RH(D): O POS

## 2013-08-31 SURGERY — COLONOSCOPY
Anesthesia: Moderate Sedation

## 2013-08-31 MED ORDER — ATORVASTATIN CALCIUM 40 MG PO TABS
40.0000 mg | ORAL_TABLET | Freq: Every day | ORAL | Status: DC
  Administered 2013-08-31 – 2013-09-02 (×3): 40 mg via ORAL
  Filled 2013-08-31 (×4): qty 1

## 2013-08-31 MED ORDER — DIPHENHYDRAMINE HCL 25 MG PO CAPS
25.0000 mg | ORAL_CAPSULE | Freq: Once | ORAL | Status: AC
  Administered 2013-08-31: 25 mg via ORAL
  Filled 2013-08-31: qty 1

## 2013-08-31 MED ORDER — SODIUM CHLORIDE 0.9 % IV SOLN: INTRAVENOUS | Status: DC

## 2013-08-31 MED ORDER — ACETAMINOPHEN 325 MG PO TABS
650.0000 mg | ORAL_TABLET | Freq: Once | ORAL | Status: AC
  Administered 2013-08-31: 650 mg via ORAL
  Filled 2013-08-31: qty 2

## 2013-08-31 MED ORDER — MIDAZOLAM HCL 10 MG/2ML IJ SOLN
INTRAMUSCULAR | Status: DC | PRN
  Administered 2013-08-31: 1 mg via INTRAVENOUS
  Administered 2013-08-31: 2 mg via INTRAVENOUS

## 2013-08-31 MED ORDER — FENTANYL CITRATE 0.05 MG/ML IJ SOLN
INTRAMUSCULAR | Status: AC
  Filled 2013-08-31: qty 2

## 2013-08-31 MED ORDER — FENTANYL CITRATE 0.05 MG/ML IJ SOLN
INTRAMUSCULAR | Status: DC | PRN
  Administered 2013-08-31 (×2): 25 ug via INTRAVENOUS

## 2013-08-31 MED ORDER — MIDAZOLAM HCL 5 MG/ML IJ SOLN
INTRAMUSCULAR | Status: AC
  Filled 2013-08-31: qty 2

## 2013-08-31 MED ORDER — MENTHOL 3 MG MT LOZG
1.0000 | LOZENGE | OROMUCOSAL | Status: DC | PRN
  Administered 2013-08-31 – 2013-09-01 (×4): 3 mg via ORAL
  Filled 2013-08-31: qty 9

## 2013-08-31 MED ORDER — PANTOPRAZOLE SODIUM 40 MG IV SOLR
40.0000 mg | Freq: Two times a day (BID) | INTRAVENOUS | Status: DC
  Administered 2013-08-31 – 2013-09-02 (×5): 40 mg via INTRAVENOUS
  Filled 2013-08-31 (×7): qty 40

## 2013-08-31 MED FILL — Sodium Chloride IV Soln 0.9%: INTRAVENOUS | Qty: 50 | Status: AC

## 2013-08-31 NOTE — Progress Notes (Addendum)
DAILY PROGRESS NOTE  Subjective:  GI bleeding noted overnight - now with dark red stools. Being transfused. Unfortunately, with recent DES to the RCA, we cannot stop DAPT.  Objective:  Temp:  [97.9 F (36.6 C)-99.6 F (37.6 C)] 98.6 F (37 C) (06/10 0830) Pulse Rate:  [32-100] 70 (06/10 0830) Resp:  [16-39] 24 (06/10 0830) BP: (73-129)/(33-79) 73/46 mmHg (06/10 0830) SpO2:  [85 %-100 %] 93 % (06/10 0830) Weight change:   Intake/Output from previous day: 06/09 0701 - 06/10 0700 In: 2587.8 [P.O.:740; I.V.:1085.7; Blood:112.1; IV Piggyback:650] Out: 2000 [Urine:2000]  Intake/Output from this shift: Total I/O In: 60 [P.O.:60] Out: -   Medications: Current Facility-Administered Medications  Medication Dose Route Frequency Provider Last Rate Last Dose  . aspirin chewable tablet 81 mg  81 mg Oral Daily Pixie Casino, MD   81 mg at 08/30/13 1032  . atorvastatin (LIPITOR) tablet 40 mg  40 mg Oral q1800 Doree Fudge, MD      . guaiFENesin-dextromethorphan (ROBITUSSIN DM) 100-10 MG/5ML syrup 15 mL  15 mL Oral Q4H PRN Leonie Man, MD      . hydrocortisone sodium succinate (SOLU-CORTEF) 100 MG injection 50 mg  50 mg Intravenous Q6H Rahul P Desai, PA-C   50 mg at 08/31/13 0535  . insulin aspart (novoLOG) injection 0-20 Units  0-20 Units Subcutaneous TID WC Alwyn Pea, MD   4 Units at 08/31/13 0900  . insulin glargine (LANTUS) injection 25 Units  25 Units Subcutaneous QHS Rahul P Desai, PA-C   25 Units at 08/30/13 2203  . levothyroxine (SYNTHROID, LEVOTHROID) injection 87.5 mcg  87.5 mcg Intravenous Daily Rahul P Desai, PA-C   87.5 mcg at 08/31/13 0959  . menthol-cetylpyridinium (CEPACOL) lozenge 3 mg  1 lozenge Oral PRN Doree Fudge, MD      . morphine 2 MG/ML injection 2 mg  2 mg Intravenous Q1H PRN Leonie Man, MD      . piperacillin-tazobactam (ZOSYN) IVPB 3.375 g  3.375 g Intravenous 3 times per day Wilhelmina Mcardle, MD   3.375 g at 08/31/13  0535  . ticagrelor (BRILINTA) tablet 90 mg  90 mg Oral BID Leonie Man, MD   90 mg at 08/30/13 1031    Physical Exam: General appearance: alert and no distress Neck: no carotid bruit and no JVD Lungs: clear to auscultation bilaterally Heart: regular rate and rhythm, S1, S2 normal, no murmur, click, rub or gallop Abdomen: soft, mild TTP with deep palpation around the umbilicus Extremities: extremities normal, atraumatic, no cyanosis or edema Pulses: 2+ and symmetric Skin: Skin color, texture, turgor normal. No rashes or lesions Neurologic: Grossly normal Psych: Normal  Lab Results: Results for orders placed during the hospital encounter of 08/29/13 (from the past 48 hour(s))  CBC WITH DIFFERENTIAL     Status: Abnormal   Collection Time    08/29/13 11:00 PM      Result Value Ref Range   WBC 14.4 (*) 4.0 - 10.5 K/uL   RBC 4.66  3.87 - 5.11 MIL/uL   Hemoglobin 13.0  12.0 - 15.0 g/dL   HCT 40.8  36.0 - 46.0 %   MCV 87.6  78.0 - 100.0 fL   MCH 27.9  26.0 - 34.0 pg   MCHC 31.9  30.0 - 36.0 g/dL   RDW 15.0  11.5 - 15.5 %   Platelets 399  150 - 400 K/uL   Neutrophils Relative % 73  43 - 77 %   Neutro  Abs 10.4 (*) 1.7 - 7.7 K/uL   Lymphocytes Relative 16  12 - 46 %   Lymphs Abs 2.4  0.7 - 4.0 K/uL   Monocytes Relative 10  3 - 12 %   Monocytes Absolute 1.5 (*) 0.1 - 1.0 K/uL   Eosinophils Relative 0  0 - 5 %   Eosinophils Absolute 0.0  0.0 - 0.7 K/uL   Basophils Relative 1  0 - 1 %   Basophils Absolute 0.1  0.0 - 0.1 K/uL  COMPREHENSIVE METABOLIC PANEL     Status: Abnormal   Collection Time    08/29/13 11:00 PM      Result Value Ref Range   Sodium 139  137 - 147 mEq/L   Potassium 3.9  3.7 - 5.3 mEq/L   Chloride 97  96 - 112 mEq/L   CO2 24  19 - 32 mEq/L   Glucose, Bld 153 (*) 70 - 99 mg/dL   BUN 10  6 - 23 mg/dL   Creatinine, Ser 0.96  0.50 - 1.10 mg/dL   Calcium 9.1  8.4 - 10.5 mg/dL   Total Protein 7.9  6.0 - 8.3 g/dL   Albumin 3.3 (*) 3.5 - 5.2 g/dL   AST 182 (*) 0  - 37 U/L   ALT 40 (*) 0 - 35 U/L   Alkaline Phosphatase 80  39 - 117 U/L   Total Bilirubin 1.1  0.3 - 1.2 mg/dL   GFR calc non Af Amer 63 (*) >90 mL/min   GFR calc Af Amer 72 (*) >90 mL/min   Comment: (NOTE)     The eGFR has been calculated using the CKD EPI equation.     This calculation has not been validated in all clinical situations.     eGFR's persistently <90 mL/min signify possible Chronic Kidney     Disease.  CULTURE, BLOOD (ROUTINE X 2)     Status: None   Collection Time    08/29/13 11:15 PM      Result Value Ref Range   Specimen Description BLOOD LEFT ANTECUBITAL     Special Requests BOTTLES DRAWN AEROBIC AND ANAEROBIC 5CC EA     Culture  Setup Time       Value: 08/30/2013 03:49     Performed at [facility]   Culture       Value:        BLOOD CULTURE RECEIVED NO GROWTH TO DATE CULTURE WILL BE HELD FOR 5 DAYS BEFORE ISSUING A FINAL NEGATIVE REPORT     Performed at [facility]   Report Status PENDING    PROTIME-INR     Status: None   Collection Time    08/29/13 11:15 PM      Result Value Ref Range   Prothrombin Time 15.1  11.6 - 15.2 seconds   INR 1.22  0.00 - 1.49  MAGNESIUM     Status: None   Collection Time    08/29/13 11:16 PM      Result Value Ref Range   Magnesium 2.1  1.5 - 2.5 mg/dL  CBG MONITORING, ED     Status: Abnormal   Collection Time    08/29/13 11:20 PM      Result Value Ref Range   Glucose-Capillary 133 (*) 70 - 99 mg/dL  I-STAT CHEM 8, ED     Status: Abnormal   Collection Time    08/29/13 11:26 PM      Result Value Ref Range   Sodium  142  137 - 147 mEq/L   Potassium 3.7  3.7 - 5.3 mEq/L   Chloride 97  96 - 112 mEq/L   BUN 9  6 - 23 mg/dL   Creatinine, Ser 1.10  0.50 - 1.10 mg/dL   Glucose, Bld 154 (*) 70 - 99 mg/dL   Calcium, Ion 1.14  1.13 - 1.30 mmol/L   TCO2 26  0 - 100 mmol/L   Hemoglobin 15.6 (*) 12.0 - 15.0 g/dL   HCT 46.0  36.0 - 46.0 %  I-STAT CG4 LACTIC ACID, ED     Status: Abnormal   Collection Time     08/29/13 11:26 PM      Result Value Ref Range   Lactic Acid, Venous 2.56 (*) 0.5 - 2.2 mmol/L  I-STAT TROPOININ, ED     Status: Abnormal   Collection Time    08/29/13 11:30 PM      Result Value Ref Range   Troponin i, poc 25.98 (*) 0.00 - 0.08 ng/mL   Comment NOTIFIED PHYSICIAN     Comment 3            Comment: Due to the release kinetics of cTnI,     a negative result within the first hours     of the onset of symptoms does not rule out     myocardial infarction with certainty.     If myocardial infarction is still suspected,     repeat the test at appropriate intervals.  PROCALCITONIN     Status: None   Collection Time    08/30/13 12:33 AM      Result Value Ref Range   Procalcitonin 0.21     Comment:            Interpretation:     PCT (Procalcitonin) <= 0.5 ng/mL:     Systemic infection (sepsis) is not likely.     Local bacterial infection is possible.     (NOTE)             ICU PCT Algorithm               Non ICU PCT Algorithm        ----------------------------     ------------------------------             PCT < 0.25 ng/mL                 PCT < 0.1 ng/mL         Stopping of antibiotics            Stopping of antibiotics           strongly encouraged.               strongly encouraged.        ----------------------------     ------------------------------           PCT level decrease by               PCT < 0.25 ng/mL           >= 80% from peak PCT           OR PCT 0.25 - 0.5 ng/mL          Stopping of antibiotics  encouraged.         Stopping of antibiotics               encouraged.        ----------------------------     ------------------------------           PCT level decrease by              PCT >= 0.25 ng/mL           < 80% from peak PCT            AND PCT >= 0.5 ng/mL            Continuing antibiotics                                                  encouraged.           Continuing antibiotics                 encouraged.        ----------------------------     ------------------------------         PCT level increase compared          PCT > 0.5 ng/mL             with peak PCT AND              PCT >= 0.5 ng/mL             Escalation of antibiotics                                              strongly encouraged.          Escalation of antibiotics            strongly encouraged.  POCT ACTIVATED CLOTTING TIME     Status: None   Collection Time    08/30/13  1:12 AM      Result Value Ref Range   Activated Clotting Time 658    MRSA PCR SCREENING     Status: None   Collection Time    08/30/13  2:54 AM      Result Value Ref Range   MRSA by PCR NEGATIVE  NEGATIVE   Comment:            The GeneXpert MRSA Assay (FDA     approved for NASAL specimens     only), is one component of a     comprehensive MRSA colonization     surveillance program. It is not     intended to diagnose MRSA     infection nor to guide or     monitor treatment for     MRSA infections.  GLUCOSE, CAPILLARY     Status: Abnormal   Collection Time    08/30/13  4:29 AM      Result Value Ref Range   Glucose-Capillary 122 (*) 70 - 99 mg/dL   Comment 1 Notify RN     Comment 2 Documented in Chart    CULTURE, BLOOD (ROUTINE X 2)     Status: None   Collection Time    08/30/13  7:00 AM      Result Value Ref Range  Specimen Description BLOOD RIGHT HAND     Special Requests BOTTLES DRAWN AEROBIC ONLY 3CC     Culture  Setup Time       Value: 08/30/2013 13:47     Performed at [facility]   Culture       Value:        BLOOD CULTURE RECEIVED NO GROWTH TO DATE CULTURE WILL BE HELD FOR 5 DAYS BEFORE ISSUING A FINAL NEGATIVE REPORT     Performed at [facility]   Report Status PENDING    GLUCOSE, CAPILLARY     Status: Abnormal   Collection Time    08/30/13  7:44 AM      Result Value Ref Range   Glucose-Capillary 132 (*) 70 - 99 mg/dL  SEDIMENTATION RATE     Status: Abnormal   Collection Time    08/30/13  9:05  AM      Result Value Ref Range   Sed Rate 52 (*) 0 - 22 mm/hr  GLUCOSE, CAPILLARY     Status: Abnormal   Collection Time    08/30/13 11:56 AM      Result Value Ref Range   Glucose-Capillary 140 (*) 70 - 99 mg/dL  GLUCOSE, CAPILLARY     Status: Abnormal   Collection Time    08/30/13  4:31 PM      Result Value Ref Range   Glucose-Capillary 146 (*) 70 - 99 mg/dL  GLUCOSE, CAPILLARY     Status: Abnormal   Collection Time    08/30/13  8:04 PM      Result Value Ref Range   Glucose-Capillary 185 (*) 70 - 99 mg/dL  CBC     Status: Abnormal   Collection Time    08/30/13  9:20 PM      Result Value Ref Range   WBC 16.5 (*) 4.0 - 10.5 K/uL   RBC 3.20 (*) 3.87 - 5.11 MIL/uL   Hemoglobin 9.2 (*) 12.0 - 15.0 g/dL   Comment: DELTA CHECK NOTED     REPEATED TO VERIFY   HCT 28.3 (*) 36.0 - 46.0 %   MCV 88.4  78.0 - 100.0 fL   MCH 27.5  26.0 - 34.0 pg   MCHC 31.1  30.0 - 36.0 g/dL   RDW 15.2  11.5 - 15.5 %   Platelets 296  150 - 400 K/uL   Comment: SPECIMEN CHECKED FOR CLOTS     REPEATED TO VERIFY     DELTA CHECK NOTED  PROCALCITONIN     Status: None   Collection Time    08/31/13  3:15 AM      Result Value Ref Range   Procalcitonin 0.67     Comment:            Interpretation:     PCT > 0.5 ng/mL and <= 2 ng/mL:     Systemic infection (sepsis) is possible,     but other conditions are known to elevate     PCT as well.     (NOTE)             ICU PCT Algorithm               Non ICU PCT Algorithm        ----------------------------     ------------------------------             PCT < 0.25 ng/mL  PCT < 0.1 ng/mL         Stopping of antibiotics            Stopping of antibiotics           strongly encouraged.               strongly encouraged.        ----------------------------     ------------------------------           PCT level decrease by               PCT < 0.25 ng/mL           >= 80% from peak PCT           OR PCT 0.25 - 0.5 ng/mL          Stopping of antibiotics                                                   encouraged.         Stopping of antibiotics               encouraged.        ----------------------------     ------------------------------           PCT level decrease by              PCT >= 0.25 ng/mL           < 80% from peak PCT            AND PCT >= 0.5 ng/mL            Continuing antibiotics                                                  encouraged.           Continuing antibiotics                encouraged.        ----------------------------     ------------------------------         PCT level increase compared          PCT > 0.5 ng/mL             with peak PCT AND              PCT >= 0.5 ng/mL             Escalation of antibiotics                                              strongly encouraged.          Escalation of antibiotics            strongly encouraged.  HEMOGLOBIN AND HEMATOCRIT, BLOOD     Status: Abnormal   Collection Time    08/31/13  3:15 AM      Result Value Ref Range   Hemoglobin 8.5 (*) 12.0 - 15.0 g/dL   HCT 27.0 (*) 36.0 - 46.0 %  TYPE AND SCREEN  Status: None   Collection Time    08/31/13  3:15 AM      Result Value Ref Range   ABO/RH(D) O POS     Antibody Screen NEG     Sample Expiration 09/03/2013     Unit Number [id number]     Blood Component Type RED CELLS,LR     Unit division 00     Status of Unit ISSUED     Transfusion Status OK TO TRANSFUSE     Crossmatch Result Compatible     Unit Number [id number]     Blood Component Type RED CELLS,LR     Unit division 00     Status of Unit ISSUED     Transfusion Status OK TO TRANSFUSE     Crossmatch Result Compatible     Unit Number [id number]     Blood Component Type RED CELLS,LR     Unit division 00     Status of Unit ALLOCATED     Transfusion Status OK TO TRANSFUSE     Crossmatch Result Compatible     Unit Number [id number]     Blood Component Type RBC LR PHER2     Unit division 00     Status of Unit ALLOCATED     Transfusion  Status OK TO TRANSFUSE     Crossmatch Result Compatible    ABO/RH     Status: None   Collection Time    08/31/13  3:15 AM      Result Value Ref Range   ABO/RH(D) O POS    PREPARE RBC (CROSSMATCH)     Status: None   Collection Time    08/31/13  5:00 AM      Result Value Ref Range   Order Confirmation ORDER PROCESSED BY BLOOD BANK    GLUCOSE, CAPILLARY     Status: Abnormal   Collection Time    08/31/13  9:52 AM      Result Value Ref Range   Glucose-Capillary 193 (*) 70 - 99 mg/dL    Imaging: Ct Abdomen Pelvis W Contrast  08/30/2013   CLINICAL DATA:  Abdominal pain and nausea. Hernia. Diarrhea. Symptoms beginning on Saturday.  EXAM: CT ABDOMEN AND PELVIS WITH CONTRAST  TECHNIQUE: Multidetector CT imaging of the abdomen and pelvis was performed using the standard protocol following bolus administration of intravenous contrast.  CONTRAST:  100 mL Isovue-300  COMPARISON:  06/15/2009  FINDINGS: Atelectasis in the lung bases.  Cardiac enlargement.  Diffuse fatty infiltration of the liver. Surgical absence of the gallbladder. Spleen is unremarkable with small accessory spleen. The pancreas, adrenal glands, kidneys, inferior vena cava, and retroperitoneal lymph nodes are unremarkable. Calcification of the abdominal aorta without aneurysm. Stomach and small bowel are unremarkable for degree of distention. Colon is decompressed. No free air or free fluid in the abdomen. There is a midline abdominal wall hernia, likely periumbilical, containing fat with infiltration consistent with fat necrosis. This is enlarging since previous study.  Pelvis: Appendix is normal. Small fat containing inguinal hernias. Uterus and ovaries are not enlarged. No pelvic mass or lymphadenopathy. No diverticulitis. Degenerative changes in the lumbar spine. No destructive bone lesions appreciated.  IMPRESSION: Mid abdominal periumbilical hernia containing fat with changes of fat necrosis, increasing since prior study. No bowel  herniation or obstruction. Diffuse fatty infiltration of the liver.   Electronically Signed   By: Lucienne Capers M.D.   On: 08/30/2013 00:30   Dg Chest Portable 1 View  08/30/2013   CLINICAL DATA:  Abdominal pain and cough.  Code STEMI.  EXAM: PORTABLE CHEST - 1 VIEW  COMPARISON:  None.  FINDINGS: The heart is enlarged. There is mild vascular congestion without infiltrates or overt failure. No pneumothorax or osseous lesion. Midline trachea.  IMPRESSION: Cardiomegaly, no active disease.   Electronically Signed   By: Rolla Flatten M.D.   On: 08/30/2013 00:14    Assessment:  Active Problems:   ST elevation myocardial infarction (STEMI) of inferior wall   IDDM (insulin dependent diabetes mellitus)   Panhypopituitarism   Fever   Hypotension   Shock   GI bleeding   Acute blood loss anemia   Plan:  Inferior STEMI - no chest pain currently. S/p DES on 6/9 to the RCA. Now with acute GI bleeding - being transfused 2 units of PRBC's. H/h is 8.5/27. Plan for flexible sigmoidoscopy today with Dr. Collene Mares.  Continue DAPT.  Time Spent Directly with Patient:  15 minutes  Length of Stay:  LOS: 2 days   Pixie Casino, MD, [facility] Attending Cardiologist [facility]  Dyesha Henault C 08/31/2013, 10:18 AM   \

## 2013-08-31 NOTE — Consult Note (Addendum)
Unassigned patient Reason for Consult: Rectal bleeding with drop in hemoglobin and hypotension. Referring Physician: [facility]-Dr. Debara Pickett.  Tammy Good is an 63 y.o. female.  HPI: 63 year old black female, admitted with abdominal pan, nausea, fever of 103.9 and chills found to have an MI on EKG taken to the cath lab after an abdominal CT did not reveal any acute problems. She had a DES placed in the RCA and was started on DAPT. She started passing burgundy stools the night she had the procedure and has been bleeding since then. She became hypotensive today but the Levophed was not started as her BP stabilized.  She was given 2 units of PRBC's after her hemoglobin dropped to 8.5 gms/dl from 13 gms/dl on admission. A STAT GI consult was requested. Her repeat hemoglobin was 10.6 gms/dl after the 2 units of PRBC's. She gives a history of having loose stools with periumbilical pain since Saturday 08/27/13 and claim she had 7 bloody BM's yesterday and has had 4 today. 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 She claim she has been under his care for over 2 decades. She has never had a colonoscopy. She usually has 1 BM per day with no history of hematochezia or melena prior to this admission.      Past Medical History  Diagnosis Date  . Diabetes mellitus   . Thyroid disease   . Hypopituitarism    Past Surgical History  Procedure Laterality Date  . Pituitary surgery     History reviewed. No pertinent family history.  Social History:  reports that she has quit smoking. She does not have any smokeless tobacco history on file. She reports that she does not drink alcohol or use illicit drugs.  Allergies: No Known Allergies  Medications: I have reviewed the patient's current medications.  Results for orders placed during the hospital encounter of 08/29/13 (from the past 48 hour(s))  CBC WITH DIFFERENTIAL      Status: Abnormal   Collection Time    08/29/13 11:00 PM      Result Value Ref Range   WBC 14.4 (*) 4.0 - 10.5 K/uL   RBC 4.66  3.87 - 5.11 MIL/uL   Hemoglobin 13.0  12.0 - 15.0 g/dL   HCT 40.8  36.0 - 46.0 %   MCV 87.6  78.0 - 100.0 fL   MCH 27.9  26.0 - 34.0 pg   MCHC 31.9  30.0 - 36.0 g/dL   RDW 15.0  11.5 - 15.5 %   Platelets 399  150 - 400 K/uL   Neutrophils Relative % 73  43 - 77 %   Neutro Abs 10.4 (*) 1.7 - 7.7 K/uL   Lymphocytes Relative 16  12 - 46 %   Lymphs Abs 2.4  0.7 - 4.0 K/uL   Monocytes Relative 10  3 - 12 %   Monocytes Absolute 1.5 (*) 0.1 - 1.0 K/uL   Eosinophils Relative 0  0 - 5 %   Eosinophils Absolute 0.0  0.0 - 0.7 K/uL   Basophils Relative 1  0 - 1 %   Basophils Absolute 0.1  0.0 - 0.1 K/uL  COMPREHENSIVE METABOLIC PANEL     Status: Abnormal   Collection Time    08/29/13 11:00 PM      Result Value Ref Range   Sodium 139  137 - 147 mEq/L   Potassium 3.9  3.7 - 5.3 mEq/L   Chloride 97  96 - 112 mEq/L   CO2 24  19 - 32 mEq/L   Glucose, Bld 153 (*) 70 - 99 mg/dL   BUN 10  6 - 23 mg/dL   Creatinine, Ser 0.96  0.50 - 1.10 mg/dL   Calcium 9.1  8.4 - 10.5 mg/dL   Total Protein 7.9  6.0 - 8.3 g/dL   Albumin 3.3 (*) 3.5 - 5.2 g/dL   AST 182 (*) 0 - 37 U/L   ALT 40 (*) 0 - 35 U/L   Alkaline Phosphatase 80  39 - 117 U/L   Total Bilirubin 1.1  0.3 - 1.2 mg/dL   GFR calc non Af Amer 63 (*) >90 mL/min   GFR calc Af Amer 72 (*) >90 mL/min   Comment: (NOTE)     The eGFR has been calculated using the CKD EPI equation.     This calculation has not been validated in all clinical situations.     eGFR's persistently <90 mL/min signify possible Chronic Kidney     Disease.  CULTURE, BLOOD (ROUTINE X 2)     Status: None   Collection Time    08/29/13 11:15 PM      Result Value Ref Range   Specimen Description BLOOD LEFT ANTECUBITAL     Special Requests BOTTLES DRAWN AEROBIC AND ANAEROBIC 5CC EA     Culture  Setup Time       Value: 08/30/2013 03:49      Performed at [facility]   Culture       Value:        BLOOD CULTURE RECEIVED NO GROWTH TO DATE CULTURE WILL BE HELD FOR 5 DAYS BEFORE ISSUING A FINAL NEGATIVE REPORT     Performed at [facility]   Report Status PENDING    PROTIME-INR     Status: None   Collection Time    08/29/13 11:15 PM      Result Value Ref Range   Prothrombin Time 15.1  11.6 - 15.2 seconds   INR 1.22  0.00 - 1.49  MAGNESIUM     Status: None   Collection Time    08/29/13 11:16 PM      Result Value Ref Range   Magnesium 2.1  1.5 - 2.5 mg/dL  CBG MONITORING, ED     Status: Abnormal   Collection Time    08/29/13 11:20 PM      Result Value Ref Range   Glucose-Capillary 133 (*) 70 - 99 mg/dL  I-STAT CHEM 8, ED     Status: Abnormal   Collection Time    08/29/13 11:26 PM      Result Value Ref Range   Sodium 142  137 - 147 mEq/L   Potassium 3.7  3.7 - 5.3 mEq/L   Chloride 97  96 - 112 mEq/L   BUN 9  6 - 23 mg/dL   Creatinine, Ser 1.10  0.50 - 1.10 mg/dL   Glucose, Bld 154 (*) 70 - 99 mg/dL   Calcium, Ion 1.14  1.13 - 1.30 mmol/L   TCO2 26  0 - 100 mmol/L   Hemoglobin 15.6 (*) 12.0 - 15.0 g/dL   HCT 46.0  36.0 - 46.0 %  I-STAT CG4 LACTIC ACID, ED     Status: Abnormal   Collection Time    08/29/13 11:26 PM      Result Value Ref Range   Lactic Acid, Venous 2.56 (*) 0.5 -  2.2 mmol/L  Randolm Idol, ED     Status: Abnormal   Collection Time    08/29/13 11:30 PM      Result Value Ref Range   Troponin i, poc 25.98 (*) 0.00 - 0.08 ng/mL   Comment NOTIFIED PHYSICIAN     Comment 3            Comment: Due to the release kinetics of cTnI,     a negative result within the first hours     of the onset of symptoms does not rule out     myocardial infarction with certainty.     If myocardial infarction is still suspected,     repeat the test at appropriate intervals.  PROCALCITONIN     Status: None   Collection Time    08/30/13 12:33 AM      Result Value Ref Range   Procalcitonin 0.21      Comment:            Interpretation:     PCT (Procalcitonin) <= 0.5 ng/mL:     Systemic infection (sepsis) is not likely.     Local bacterial infection is possible.     (NOTE)             ICU PCT Algorithm               Non ICU PCT Algorithm        ----------------------------     ------------------------------             PCT < 0.25 ng/mL                 PCT < 0.1 ng/mL         Stopping of antibiotics            Stopping of antibiotics           strongly encouraged.               strongly encouraged.        ----------------------------     ------------------------------           PCT level decrease by               PCT < 0.25 ng/mL           >= 80% from peak PCT           OR PCT 0.25 - 0.5 ng/mL          Stopping of antibiotics                                                 encouraged.         Stopping of antibiotics               encouraged.        ----------------------------     ------------------------------           PCT level decrease by              PCT >= 0.25 ng/mL           < 80% from peak PCT            AND PCT >= 0.5 ng/mL            Continuing antibiotics  encouraged.           Continuing antibiotics                encouraged.        ----------------------------     ------------------------------         PCT level increase compared          PCT > 0.5 ng/mL             with peak PCT AND              PCT >= 0.5 ng/mL             Escalation of antibiotics                                              strongly encouraged.          Escalation of antibiotics            strongly encouraged.  POCT ACTIVATED CLOTTING TIME     Status: None   Collection Time    08/30/13  1:12 AM      Result Value Ref Range   Activated Clotting Time 658    MRSA PCR SCREENING     Status: None   Collection Time    08/30/13  2:54 AM      Result Value Ref Range   MRSA by PCR NEGATIVE  NEGATIVE   Comment:            The GeneXpert MRSA Assay (FDA      approved for NASAL specimens     only), is one component of a     comprehensive MRSA colonization     surveillance program. It is not     intended to diagnose MRSA     infection nor to guide or     monitor treatment for     MRSA infections.  GLUCOSE, CAPILLARY     Status: Abnormal   Collection Time    08/30/13  4:29 AM      Result Value Ref Range   Glucose-Capillary 122 (*) 70 - 99 mg/dL   Comment 1 Notify RN     Comment 2 Documented in Chart    CULTURE, BLOOD (ROUTINE X 2)     Status: None   Collection Time    08/30/13  7:00 AM      Result Value Ref Range   Specimen Description BLOOD RIGHT HAND     Special Requests BOTTLES DRAWN AEROBIC ONLY 3CC     Culture  Setup Time       Value: 08/30/2013 13:47     Performed at [facility]   Culture       Value:        BLOOD CULTURE RECEIVED NO GROWTH TO DATE CULTURE WILL BE HELD FOR 5 DAYS BEFORE ISSUING A FINAL NEGATIVE REPORT     Performed at [facility]   Report Status PENDING    GLUCOSE, CAPILLARY     Status: Abnormal   Collection Time    08/30/13  7:44 AM      Result Value Ref Range   Glucose-Capillary 132 (*) 70 - 99 mg/dL  SEDIMENTATION RATE     Status: Abnormal   Collection Time    08/30/13  9:05 AM      Result Value Ref Range  Sed Rate 52 (*) 0 - 22 mm/hr  GLUCOSE, CAPILLARY     Status: Abnormal   Collection Time    08/30/13 11:56 AM      Result Value Ref Range   Glucose-Capillary 140 (*) 70 - 99 mg/dL  GLUCOSE, CAPILLARY     Status: Abnormal   Collection Time    08/30/13  4:31 PM      Result Value Ref Range   Glucose-Capillary 146 (*) 70 - 99 mg/dL  GLUCOSE, CAPILLARY     Status: Abnormal   Collection Time    08/30/13  8:04 PM      Result Value Ref Range   Glucose-Capillary 185 (*) 70 - 99 mg/dL  CBC     Status: Abnormal   Collection Time    08/30/13  9:20 PM      Result Value Ref Range   WBC 16.5 (*) 4.0 - 10.5 K/uL   RBC 3.20 (*) 3.87 - 5.11 MIL/uL   Hemoglobin 9.2 (*) 12.0 - 15.0  g/dL   Comment: DELTA CHECK NOTED     REPEATED TO VERIFY   HCT 28.3 (*) 36.0 - 46.0 %   MCV 88.4  78.0 - 100.0 fL   MCH 27.5  26.0 - 34.0 pg   MCHC 31.1  30.0 - 36.0 g/dL   RDW 15.2  11.5 - 15.5 %   Platelets 296  150 - 400 K/uL   Comment: SPECIMEN CHECKED FOR CLOTS     REPEATED TO VERIFY     DELTA CHECK NOTED  PROCALCITONIN     Status: None   Collection Time    08/31/13  3:15 AM      Result Value Ref Range   Procalcitonin 0.67     Comment:            Interpretation:     PCT > 0.5 ng/mL and <= 2 ng/mL:     Systemic infection (sepsis) is possible,     but other conditions are known to elevate     PCT as well.     (NOTE)             ICU PCT Algorithm               Non ICU PCT Algorithm        ----------------------------     ------------------------------             PCT < 0.25 ng/mL                 PCT < 0.1 ng/mL         Stopping of antibiotics            Stopping of antibiotics           strongly encouraged.               strongly encouraged.        ----------------------------     ------------------------------           PCT level decrease by               PCT < 0.25 ng/mL           >= 80% from peak PCT           OR PCT 0.25 - 0.5 ng/mL          Stopping of antibiotics  encouraged.         Stopping of antibiotics               encouraged.        ----------------------------     ------------------------------           PCT level decrease by              PCT >= 0.25 ng/mL           < 80% from peak PCT            AND PCT >= 0.5 ng/mL            Continuing antibiotics                                                  encouraged.           Continuing antibiotics                encouraged.        ----------------------------     ------------------------------         PCT level increase compared          PCT > 0.5 ng/mL             with peak PCT AND              PCT >= 0.5 ng/mL             Escalation of antibiotics                                               strongly encouraged.          Escalation of antibiotics            strongly encouraged.  HEMOGLOBIN AND HEMATOCRIT, BLOOD     Status: Abnormal   Collection Time    08/31/13  3:15 AM      Result Value Ref Range   Hemoglobin 8.5 (*) 12.0 - 15.0 g/dL   HCT 27.0 (*) 36.0 - 46.0 %  TYPE AND SCREEN     Status: None   Collection Time    08/31/13  3:15 AM      Result Value Ref Range   ABO/RH(D) O POS     Antibody Screen NEG     Sample Expiration 09/03/2013     Unit Number [id number]     Blood Component Type RED CELLS,LR     Unit division 00     Status of Unit ISSUED     Transfusion Status OK TO TRANSFUSE     Crossmatch Result Compatible     Unit Number [id number]     Blood Component Type RED CELLS,LR     Unit division 00     Status of Unit ISSUED     Transfusion Status OK TO TRANSFUSE     Crossmatch Result Compatible     Unit Number [id number]     Blood Component Type RED CELLS,LR     Unit division 00     Status of Unit ALLOCATED     Transfusion Status OK TO TRANSFUSE     Crossmatch Result Compatible     Unit Number [id number]  Blood Component Type RBC LR PHER2     Unit division 00     Status of Unit ALLOCATED     Transfusion Status OK TO TRANSFUSE     Crossmatch Result Compatible    ABO/RH     Status: None   Collection Time    08/31/13  3:15 AM      Result Value Ref Range   ABO/RH(D) O POS    PREPARE RBC (CROSSMATCH)     Status: None   Collection Time    08/31/13  5:00 AM      Result Value Ref Range   Order Confirmation ORDER PROCESSED BY BLOOD BANK    GLUCOSE, CAPILLARY     Status: Abnormal   Collection Time    08/31/13  9:52 AM      Result Value Ref Range   Glucose-Capillary 193 (*) 70 - 99 mg/dL  GLUCOSE, CAPILLARY     Status: Abnormal   Collection Time    08/31/13 11:38 AM      Result Value Ref Range   Glucose-Capillary 153 (*) 70 - 99 mg/dL   Ct Abdomen Pelvis W Contrast  08/30/2013   CLINICAL DATA:  Abdominal  pain and nausea. Hernia. Diarrhea. Symptoms beginning on Saturday.  EXAM: CT ABDOMEN AND PELVIS WITH CONTRAST  TECHNIQUE: Multidetector CT imaging of the abdomen and pelvis was performed using the standard protocol following bolus administration of intravenous contrast.  CONTRAST:  100 mL Isovue-300  COMPARISON:  06/15/2009  FINDINGS: Atelectasis in the lung bases.  Cardiac enlargement.  Diffuse fatty infiltration of the liver. Surgical absence of the gallbladder. Spleen is unremarkable with small accessory spleen. The pancreas, adrenal glands, kidneys, inferior vena cava, and retroperitoneal lymph nodes are unremarkable. Calcification of the abdominal aorta without aneurysm. Stomach and small bowel are unremarkable for degree of distention. Colon is decompressed. No free air or free fluid in the abdomen. There is a midline abdominal wall hernia, likely periumbilical, containing fat with infiltration consistent with fat necrosis. This is enlarging since previous study.  Pelvis: Appendix is normal. Small fat containing inguinal hernias. Uterus and ovaries are not enlarged. No pelvic mass or lymphadenopathy. No diverticulitis. Degenerative changes in the lumbar spine. No destructive bone lesions appreciated.  IMPRESSION: Mid abdominal periumbilical hernia containing fat with changes of fat necrosis, increasing since prior study. No bowel herniation or obstruction. Diffuse fatty infiltration of the liver.   Electronically Signed   By: Lucienne Capers M.D.   On: 08/30/2013 00:30   Dg Chest Portable 1 View  08/30/2013   CLINICAL DATA:  Abdominal pain and cough.  Code STEMI.  EXAM: PORTABLE CHEST - 1 VIEW  COMPARISON:  None.  FINDINGS: The heart is enlarged. There is mild vascular congestion without infiltrates or overt failure. No pneumothorax or osseous lesion. Midline trachea.  IMPRESSION: Cardiomegaly, no active disease.   Electronically Signed   By: Rolla Flatten M.D.   On: 08/30/2013 00:14   Review of Systems   Constitutional: Positive for malaise/fatigue. Negative for fever, chills, weight loss and diaphoresis.  HENT: Negative.   Eyes: Negative.   Respiratory: Negative.   Cardiovascular: Negative.   Gastrointestinal: Positive for diarrhea and blood in stool. Negative for heartburn, nausea, vomiting, abdominal pain and melena.  Genitourinary: Negative.   Musculoskeletal: Positive for back pain.  Skin: Negative.   Neurological: Positive for weakness.  Endo/Heme/Allergies: Bruises/bleeds easily.  Psychiatric/Behavioral: Negative.    Blood pressure 128/73, pulse 79, temperature 98.8 F (37.1 C), temperature source Oral,  resp. rate 24, height _0  (1.702 m), weight 106.3 kg (234 lb 5.6 oz), SpO2 97.00%. Physical Exam  Constitutional: She appears well-developed and well-nourished.  HENT:  Head: Normocephalic and atraumatic.  Eyes: Conjunctivae and EOM are normal. Pupils are equal, round, and reactive to light.  Neck: Normal range of motion. Neck supple.  Cardiovascular: Normal rate and regular rhythm.   Respiratory: Effort normal and breath sounds normal.  GI: Soft. Bowel sounds are normal. There is no tenderness. There is no rebound and no guarding.  Morbidly obese with a medium sized reducible umbilical hernia; surgical scars present from previous surgeries  Skin: Skin is warm, dry and intact. Bruising noted.  Psychiatric: She has a normal mood and affect. Her speech is normal and behavior is normal. Thought content normal. Cognition and memory are normal.   Assessment/Plan: 1) Severe anemia with rectal bleeding; hypotension in the setting of a DES in the RCA with patient on DAPT: plans are tp proceed with an unprepped flexible sigmoidoscopy vs colonoscopy and make further recommendations as needed.  2) Abnormal LFT's with fatty liver on CT.  3) S/P STEMI with DES to RCA.  4) Panhypopituitarism. 5) AODM/Morbid obesity. 6) Hyperlipidemia. 7) Reducible umbilical and bilateral inguinal  hernias.  8) Diarrhea with abdominal pain and fever-etiology still not clear. Tyon Cerasoli 08/31/2013, 12:24 PM

## 2013-08-31 NOTE — Op Note (Addendum)
[facility] [address] [place], [address]   OPERATIVE PROCEDURE REPORT  PATIENT: Tammy Good, Tammy Good  MR#: [id number] BIRTHDATE: [birth year] GENDER: Female ENDOSCOPIST: Edmonia James, MD ASSISTANT:   Verlon Au, RN, BSN Cristopher Estimable, technician PROCEDURE DATE: 08/31/2013 PRE-PROCEDURE PREPARATION: Patient fasted for 2 hours prior to procedure. She was not prepped for the procedure as it was done emergently. Patient was not taken off her DAPT as she has had a recent cath with DES placed in the RCA.  PRE-PROCEDURE PHYSICAL: Patient has stable vital signs.  Neck is supple.  There is no JVD, thyromegaly or LAD.  Chest clear to auscultation.  S1 and S2 regular.  Abdomen soft, morbidly obese with a reducible umbilical hernia; inon-distended, non-tender with NABS. PROCEDURE:     Colonoscopy, diagnostic ASA CLASS:     Class IV INDICATIONS:     1.  Rectal bleeding.   2.  Severe drop in hemoglobin FROM 13-8.5 gmd/dl 3. Colorectal cancer screening. MEDICATIONS:     Fentanyl 50 mcg & Versed 4 mg IV  DESCRIPTION OF PROCEDURE:   After the risks, benefits, and alternatives of the procedure were thoroughly explained [including a 10% missed rate of cancer and polyps], informed consent was obtained.  Digital rectal exam was performed.  The Pentax video colonoscope [id number]  was introduced through the anus  and advanced to the cecum, which was identified by the ileocecal valve , limited by No adverse events experienced.   The quality of the prep was poor. . Multiple washes were done. Small lesions could be missed. The instrument was then slowly withdrawn as the colon was fully examined.     COLON FINDINGS: Small hemorrhoids were noted on retroflexion. There was a large amount of black debris throughout the colon. The procedure was completed upto the cecum but the appendiceal orifice was not visulaized. There was a large amount of solid black debris in the  T.I. The areas of the colonic mucosa that were visualized appeared healthy with a normal vascular pattern.  No masses, polyps, diverticula or AVMs were noted.  The ICV were identified and photographed. The patient tolerated the procedure without immediate complications.  The scope was then withdrawn from the patient and the procedure terminated.  TIME TO CECUM:   10 minutes 00 seconds WITHDRAW TIME:  18 minutes 00 seconds  IMPRESSION:     Small internal hemorrhoids with lot of black debris in the colon-no deficite source of bleeding identified.   RECOMMENDATIONS:     Monitor serial CBC's. EGD later today or early tomorrow.   REPEAT EXAM:      ASAP after the patient's acute problems resolve.    CPT CODES:     [id number], Colonoscopy  DIAGNOSIS CODES:     569.3 Hematochezia 280.9 Iron Deficiency Anemia V76.51 Colorectal cancer screening   REFERRED BY: Dr. Debara Pickett  eSigned:  Dr. Edmonia James, MD 08/31/2013 6:02 PM Revised: 08/31/2013 6:02 PM  PATIENT NAME:  Tammy Good, Tammy Good MR#: [id number]

## 2013-08-31 NOTE — Progress Notes (Addendum)
PULMONARY / CRITICAL CARE MEDICINE   Name: Tammy Good MRN: [id number] DOB: October 29, 1950    ADMISSION DATE:  08/29/2013 CONSULTATION DATE:  08/29/2013  REFERRING MD :  EDP PRIMARY SERVICE: Cardiology  CHIEF COMPLAINT:  Hypotension, fever  BRIEF PATIENT DESCRIPTION: 63 yo with DM and hypopituitarism presented with nausea x 3 days and abdominal pain/diarrhea x 1 day.  EKG revealed inferior MI, troponin 25.  PCCM consulted for hypotension / fever of 104.  SIGNIFICANT EVENTS / STUDIES:  6/9  EKG >>> inferior MI, troponin 25. 6/9  CT Abd/pelvis >>> Periumbilical hernia, no bowel obstruction, fatty liver 6/9  Cath lab >>> 100% occluded RCA and 100% occluded s/p DES 6/9  TTE >>> LVH, EF 55%, hypokinesis, grade 1 DD   LINES / TUBES:  CULTURES: 6/9    MRSA PCR >>> neg 6/9    Blood >>> 6/10  Urine >>>  ANTIBIOTICS: Vancomycin 6/9 >>> 6/10 Zosyn 6/9 >>>  INTERVAL HISTORY: Bloody bowel movements overnight. Off vasopressors.  VITAL SIGNS: Temp:  [97.9 F (36.6 C)-99.6 F (37.6 C)] 98.6 F (37 C) (06/10 0830) Pulse Rate:  [32-100] 70 (06/10 0830) Resp:  [16-39] 24 (06/10 0830) BP: (73-129)/(33-79) 73/46 mmHg (06/10 0830) SpO2:  [85 %-100 %] 93 % (06/10 0830)  HEMODYNAMICS:   VENTILATOR SETTINGS:   INTAKE / OUTPUT: Intake/Output     06/09 0701 - 06/10 0700 06/10 0701 - 06/11 0700   P.O. 740 60   I.V. (mL/kg) 1085.7 (10.2)    Blood 112.1    IV Piggyback 650    Total Intake(mL/kg) 2587.8 (24.3) 60 (0.6)   Urine (mL/kg/hr) 2000 (0.8)    Total Output 2000     Net +587.8 +60        Urine Occurrence 8 x    Stool Occurrence 2 x      PHYSICAL EXAMINATION: General: No distress Neuro: Awake, alert HEENT: PERRL Cardiovascular: Regular, no murmurs Lungs: CTAB Abdomen: Obese, soft, reducible periumbilical hernia Musculoskeletal: No edema Skin: No rash  LABS:  CBC  Recent Labs Lab 08/29/13 2300 08/29/13 2326 08/30/13 2120 08/31/13 0315  WBC 14.4*  --  16.5*  --    HGB 13.0 15.6* 9.2* 8.5*  HCT 40.8 46.0 28.3* 27.0*  PLT 399  --  296  --    Coag's  Recent Labs Lab 08/29/13 2315  INR 1.22   BMET  Recent Labs Lab 08/29/13 2300 08/29/13 2326  NA 139 142  K 3.9 3.7  CL 97 97  CO2 24  --   BUN 10 9  CREATININE 0.96 1.10  GLUCOSE 153* 154*   Electrolytes  Recent Labs Lab 08/29/13 2300 08/29/13 2316  CALCIUM 9.1  --   MG  --  2.1   Sepsis Markers  Recent Labs Lab 08/29/13 2326 08/30/13 0033 08/31/13 0315  LATICACIDVEN 2.56*  --   --   PROCALCITON  --  0.21 0.67   ABG No results found for this basename: PHART, PCO2ART, PO2ART,  in the last 168 hours Liver Enzymes  Recent Labs Lab 08/29/13 2300  AST 182*  ALT 40*  ALKPHOS 80  BILITOT 1.1  ALBUMIN 3.3*   Cardiac Enzymes No results found for this basename: TROPONINI, PROBNP,  in the last 168 hours Glucose  Recent Labs Lab 08/29/13 2320 08/30/13 0429 08/30/13 0744 08/30/13 1156 08/30/13 1631 08/30/13 2004  GLUCAP 133* 122* 132* 140* 146* 185*   IMAGING:   Ct Abdomen Pelvis W Contrast  08/30/2013   CLINICAL DATA:  Abdominal pain and nausea. Hernia. Diarrhea. Symptoms beginning on Saturday.  EXAM: CT ABDOMEN AND PELVIS WITH CONTRAST  TECHNIQUE: Multidetector CT imaging of the abdomen and pelvis was performed using the standard protocol following bolus administration of intravenous contrast.  CONTRAST:  100 mL Isovue-300  COMPARISON:  06/15/2009  FINDINGS: Atelectasis in the lung bases.  Cardiac enlargement.  Diffuse fatty infiltration of the liver. Surgical absence of the gallbladder. Spleen is unremarkable with small accessory spleen. The pancreas, adrenal glands, kidneys, inferior vena cava, and retroperitoneal lymph nodes are unremarkable. Calcification of the abdominal aorta without aneurysm. Stomach and small bowel are unremarkable for degree of distention. Colon is decompressed. No free air or free fluid in the abdomen. There is a midline abdominal wall hernia,  likely periumbilical, containing fat with infiltration consistent with fat necrosis. This is enlarging since previous study.  Pelvis: Appendix is normal. Small fat containing inguinal hernias. Uterus and ovaries are not enlarged. No pelvic mass or lymphadenopathy. No diverticulitis. Degenerative changes in the lumbar spine. No destructive bone lesions appreciated.  IMPRESSION: Mid abdominal periumbilical hernia containing fat with changes of fat necrosis, increasing since prior study. No bowel herniation or obstruction. Diffuse fatty infiltration of the liver.   Electronically Signed   By: Lucienne Capers M.D.   On: 08/30/2013 00:30   Dg Chest Portable 1 View  08/30/2013   CLINICAL DATA:  Abdominal pain and cough.  Code STEMI.  EXAM: PORTABLE CHEST - 1 VIEW  COMPARISON:  None.  FINDINGS: The heart is enlarged. There is mild vascular congestion without infiltrates or overt failure. No pneumothorax or osseous lesion. Midline trachea.  IMPRESSION: Cardiomegaly, no active disease.   Electronically Signed   By: Rolla Flatten M.D.   On: 08/30/2013 00:14   ASSESSMENT / PLAN:  PULMONARY A: No active issues P:   No intervention required  CARDIOVASCULAR A:  STEMI, s/p PTCA / DES Hypotension, resolved Chronic diastolic heart failure P:  Cardiology following ASA, Brilinta - hold for GI hemorrhage? Add Lipitor BB/ ACEI contraindicated - hypotension D/c Levophed  RENAL A:  No acute issues P:   Trend BMP  GASTROINTESTINAL A:  Abd pain, nausea - improved, abdomen CT benign Lower GI hemorrhage in setting of antiplatelet agents Nutrition GI Px is not indicated P:   GI consulted Flex sig today NPO D/c Protonx  HEMATOLOGIC A:  Acute blood loss anemia VTE Px P:  Trend CBC SCD Transfuse for Hb<8  INFECTIOUS A:  FUO ( none today ) P:   Continue Zosyn D/c Vancomycin ( MRSA PCR neg ) Urine cx ( was not sent )  ENDOCRINE A:  Hypopituitarism Hypothyroidism DM Probable adrenal  insufficiency P:   Hydrocortisone Synthroid SSI Lantus  NEUROLOGIC A:   No active issues P:   No intervention required  I have personally obtained history, examined patient, evaluated and interpreted laboratory and imaging results, reviewed medical records, formulated assessment / plan and placed orders.  Doree Fudge, MD Pulmonary and [place] Pager: [phone]  08/31/2013, 10:00 AM

## 2013-08-31 NOTE — Progress Notes (Signed)
Pt had 2 episode of bloody diarrhea. Dr. Colon Flattery aware. Family also aware. Hemoglobin rechecked. Waiting for result. Pt VS stable at this time though pt stated that she starting to feel lightheaded. Will continue to monitor.

## 2013-09-01 ENCOUNTER — Encounter (HOSPITAL_COMMUNITY)
Admission: EM | Disposition: A | Discharge status: Home / Self Care | Payer: BC Managed Care – PPO | Source: Home / Self Care | Attending: Pulmonary Disease

## 2013-09-01 ENCOUNTER — Encounter (HOSPITAL_COMMUNITY): Payer: Self-pay | Admitting: Gastroenterology

## 2013-09-01 ENCOUNTER — Ambulatory Visit (INDEPENDENT_AMBULATORY_CARE_PROVIDER_SITE_OTHER): Payer: Self-pay | Admitting: Surgery

## 2013-09-01 DIAGNOSIS — D62 Acute posthemorrhagic anemia: Secondary | ICD-10-CM

## 2013-09-01 DIAGNOSIS — R062 Wheezing: Secondary | ICD-10-CM

## 2013-09-01 DIAGNOSIS — K922 Gastrointestinal hemorrhage, unspecified: Secondary | ICD-10-CM

## 2013-09-01 HISTORY — PX: ESOPHAGOGASTRODUODENOSCOPY: SHX5428

## 2013-09-01 LAB — CBC
HCT: 30.2 % — ABNORMAL LOW (ref 36.0–46.0)
Hemoglobin: 9.6 g/dL — ABNORMAL LOW (ref 12.0–15.0)
MCH: 27.7 pg (ref 26.0–34.0)
MCHC: 31.8 g/dL (ref 30.0–36.0)
MCV: 87 fL (ref 78.0–100.0)
Platelets: 288 10*3/uL (ref 150–400)
RBC: 3.47 MIL/uL — ABNORMAL LOW (ref 3.87–5.11)
RDW: 14.8 % (ref 11.5–15.5)
WBC: 13.2 10*3/uL — ABNORMAL HIGH (ref 4.0–10.5)

## 2013-09-01 LAB — BASIC METABOLIC PANEL
BUN: 13 mg/dL (ref 6–23)
CO2: 22 mEq/L (ref 19–32)
Calcium: 8.2 mg/dL — ABNORMAL LOW (ref 8.4–10.5)
Chloride: 102 mEq/L (ref 96–112)
Creatinine, Ser: 0.54 mg/dL (ref 0.50–1.10)
GFR calc Af Amer: >90 mL/min (ref >90)
GFR calc non Af Amer: >90 mL/min (ref >90)
Glucose, Bld: 149 mg/dL — ABNORMAL HIGH (ref 70–99)
Potassium: 3.6 mEq/L — ABNORMAL LOW (ref 3.7–5.3)
Sodium: 141 mEq/L (ref 137–147)

## 2013-09-01 LAB — URINE CULTURE
Colony Count: NO GROWTH
Culture: NO GROWTH

## 2013-09-01 LAB — GLUCOSE, CAPILLARY
Glucose-Capillary: 133 mg/dL — ABNORMAL HIGH (ref 70–99)
Glucose-Capillary: 97 mg/dL (ref 70–99)
Glucose-Capillary: 174 mg/dL — ABNORMAL HIGH (ref 70–99)

## 2013-09-01 LAB — PROCALCITONIN: Procalcitonin: 0.42 ng/mL

## 2013-09-01 SURGERY — EGD (ESOPHAGOGASTRODUODENOSCOPY)
Anesthesia: Moderate Sedation

## 2013-09-01 MED ORDER — FENTANYL CITRATE 0.05 MG/ML IJ SOLN
INTRAMUSCULAR | Status: DC | PRN
  Administered 2013-09-01 (×3): 25 ug via INTRAVENOUS

## 2013-09-01 MED ORDER — POTASSIUM CHLORIDE CRYS ER 20 MEQ PO TBCR
40.0000 meq | EXTENDED_RELEASE_TABLET | Freq: Once | ORAL | Status: AC
  Administered 2013-09-01: 40 meq via ORAL
  Filled 2013-09-01: qty 2

## 2013-09-01 MED ORDER — SUCRALFATE 1 G PO TABS
1.0000 g | ORAL_TABLET | Freq: Three times a day (TID) | ORAL | Status: DC
  Administered 2013-09-01 – 2013-09-03 (×8): 1 g via ORAL
  Filled 2013-09-01 (×11): qty 1

## 2013-09-01 MED ORDER — DEXTROSE 5 % IV SOLN
2.0000 g | INTRAVENOUS | Status: DC
  Administered 2013-09-01 – 2013-09-02 (×2): 2 g via INTRAVENOUS
  Filled 2013-09-01 (×4): qty 2

## 2013-09-01 MED ORDER — SODIUM CHLORIDE 0.9 % IV SOLN: INTRAVENOUS | Status: DC

## 2013-09-01 MED ORDER — MIDAZOLAM HCL 5 MG/ML IJ SOLN
INTRAMUSCULAR | Status: AC
  Filled 2013-09-01: qty 1

## 2013-09-01 MED ORDER — FENTANYL CITRATE 0.05 MG/ML IJ SOLN
INTRAMUSCULAR | Status: AC
  Filled 2013-09-01: qty 2

## 2013-09-01 MED ORDER — IPRATROPIUM-ALBUTEROL 0.5-2.5 (3) MG/3ML IN SOLN: 3.0000 mL | RESPIRATORY_TRACT | Status: DC | PRN

## 2013-09-01 MED ORDER — MIDAZOLAM HCL 10 MG/2ML IJ SOLN
INTRAMUSCULAR | Status: DC | PRN
  Administered 2013-09-01: 2 mg via INTRAVENOUS
  Administered 2013-09-01: 1 mg via INTRAVENOUS
  Administered 2013-09-01: 2 mg via INTRAVENOUS

## 2013-09-01 NOTE — Progress Notes (Signed)
CARDIAC REHAB PHASE I   PRE:  Rate/Rhythm: 80 SR  BP:  Supine:   Sitting: 119/66  Standing:    SaO2: 99 RA  MODE:  Ambulation: 350 ft   POST:  Rate/Rhythm: 90 SR  BP:  Supine:   Sitting: 142/66  Standing:    SaO2: 100 RA 1500-1530 Assisted X 1 to ambulate. Gait steady, slow pace. Pt able to walk 350 feet without c/o of cp or SOB. VS stable. Gave pt MI booklet. We will continue to follow pt.  Rodney Langton RN 09/01/2013 3:42 PM

## 2013-09-01 NOTE — Progress Notes (Addendum)
PULMONARY / CRITICAL CARE MEDICINE   Name: Tammy Good MRN: [id number] DOB: [birth year]    ADMISSION DATE:  08/29/2013 CONSULTATION DATE:  08/29/2013  REFERRING MD :  EDP PRIMARY SERVICE: Cardiology  CHIEF COMPLAINT:  Hypotension, fever  BRIEF PATIENT DESCRIPTION: 63 yo with DM and hypopituitarism presented with nausea x 3 days and abdominal pain/diarrhea x 1 day.  EKG revealed inferior MI, troponin 25.  PCCM consulted for hypotension / fever of 104.  SIGNIFICANT EVENTS / STUDIES:  6/9  EKG >>> inferior MI, troponin 25. 6/9  CT Abd/pelvis >>> Periumbilical hernia, no bowel obstruction, fatty liver 6/9  Cath lab >>> 100% occluded RCA and 100% occluded s/p DES 6/9  TTE >>> LVH, EF 55%, hypokinesis, grade 1 DD  6/10 Colonoscopy >>> old blood, no active hemorrhage 6/11 EGD >>> reportedly duodenal ulcer, no significant hemorrhage  LINES / TUBES:  CULTURES: 6/9    MRSA PCR >>> neg 6/9    Blood >>> 6/10  Urine >>> neg  ANTIBIOTICS: Vancomycin 6/9 >>> 6/10 Zosyn 6/9 >>> 6/11 Ceftriaxone 6/11 >>>  INTERVAL HISTORY: EGD without complications.  VITAL SIGNS: Temp:  [98 F (36.7 C)-99.2 F (37.3 C)] 98.5 F (36.9 C) (06/11 0407) Pulse Rate:  [42-130] 71 (06/11 1145) Resp:  [16-28] 28 (06/11 1145) BP: (117-157)/(46-90) 127/47 mmHg (06/11 1145) SpO2:  [95 %-100 %] 97 % (06/11 1145)  HEMODYNAMICS:   VENTILATOR SETTINGS:   INTAKE / OUTPUT: Intake/Output     06/10 0701 - 06/11 0700 06/11 0701 - 06/12 0700   P.O. 780    I.V. (mL/kg) 170 (1.6)    Blood 670    IV Piggyback 150    Total Intake(mL/kg) 1770 (16.7)    Urine (mL/kg/hr) 500 (0.2)    Total Output 500     Net +1270 0        Urine Occurrence 3 x 1 x   Stool Occurrence 5 x      PHYSICAL EXAMINATION: General: Resting comfortable, no distress Neuro: Awake, alert HEENT: PERRL Cardiovascular: Regular Lungs: bilateral diminished air entry Abdomen: Obese, soft Musculoskeletal: No edema Skin: No  rash  LABS:  CBC  Recent Labs Lab 08/30/13 2120 08/31/13 0315 08/31/13 1125 09/01/13 0402  WBC 16.5*  --  14.8* 13.2*  HGB 9.2* 8.5* 10.6* 9.6*  HCT 28.3* 27.0* 32.9* 30.2*  PLT 296  --  265 288   Coag's  Recent Labs Lab 08/29/13 2315  INR 1.22   BMET  Recent Labs Lab 08/29/13 2300 08/29/13 2326 09/01/13 0402  NA 139 142 141  K 3.9 3.7 3.6*  CL 97 97 102  CO2 24  --  22  BUN 10 9 13   CREATININE 0.96 1.10 0.54  GLUCOSE 153* 154* 149*   Electrolytes  Recent Labs Lab 08/29/13 2300 08/29/13 2316 09/01/13 0402  CALCIUM 9.1  --  8.2*  MG  --  2.1  --    Sepsis Markers  Recent Labs Lab 08/29/13 2326 08/30/13 0033 08/31/13 0315 09/01/13 0402  LATICACIDVEN 2.56*  --   --   --   PROCALCITON  --  0.21 0.67 0.42   ABG No results found for this basename: PHART, PCO2ART, PO2ART,  in the last 168 hours Liver Enzymes  Recent Labs Lab 08/29/13 2300  AST 182*  ALT 40*  ALKPHOS 80  BILITOT 1.1  ALBUMIN 3.3*   Cardiac Enzymes No results found for this basename: TROPONINI, PROBNP,  in the last 168 hours  Glucose  Recent Labs  Lab 08/31/13 0952 08/31/13 1138 08/31/13 1645 08/31/13 2150 09/01/13 0829 09/01/13 1124  GLUCAP 193* 153* 112* 104* 133* 97   IMAGING:   No results found.  ASSESSMENT / PLAN:  PULMONARY A: Cough / wheezing P:   DuoNebs PRN  CARDIOVASCULAR A:  STEMI, s/p PTCA / DES Hypotension, resolved Chronic diastolic heart failure P:  Cardiology following ASA, Brilinta, Lipitor BB/ ACEI contraindicated - hypotension  RENAL A:  Hypokalemia  P:   Trend BMP K 40  GASTROINTESTINAL A:  Abd pain, nausea - improved, abdomen CT benign Bleeding duodenal ulcer Nutrition P:   GI following Flex sig today Protonix bid Sucralfate  HEMATOLOGIC A:  Acute blood loss anemia VTE Px P:  Trend CBC SCD Transfuse for Hb<8  INFECTIOUS A:  FUO, resolved P:   D/c Zosyn Ceftriaxone to finish 7 days  ENDOCRINE A:   Hypopituitarism Hypothyroidism DM Probable adrenal insufficiency P:   Hydrocortisone Synthroid SSI Lantus  NEUROLOGIC A:   No active issues P:   No intervention required  Transfer to SDU 6/11. TRH to assume care 6/12.  PCCM will sign off.  I have personally obtained history, examined patient, evaluated and interpreted laboratory and imaging results, reviewed medical records, formulated assessment / plan and placed orders.  Doree Fudge, MD Pulmonary and [place] Pager: [phone]  09/01/2013, 1:10 PM

## 2013-09-01 NOTE — Interval H&P Note (Signed)
History and Physical Interval Note:  09/01/2013 11:30 AM  Tammy Good  has presented today for surgery, with the diagnosis of Rectal bleeding/anemia  The various methods of treatment have been discussed with the patient and family. After consideration of risks, benefits and other options for treatment, the patient has consented to  Procedure(s) with comments: ESOPHAGOGASTRODUODENOSCOPY (EGD) (N/A) - bedside as a surgical intervention .  The patient's history has been reviewed, patient examined, no change in status, stable for surgery.  I have reviewed the patient's chart and labs.  Questions were answered to the patient's satisfaction.     Tytianna Greenley D

## 2013-09-01 NOTE — Progress Notes (Signed)
DAILY PROGRESS NOTE  Subjective:  No chest pain. "Raspy" voice today, a little wheezy. Flex sig yesterday demonstrated bleeding hemorrhoids and "black debris" in the colon. No definite source of bleeding identified. H/H responded to 2U of PRBC's up to 32.9, but now at 30.2 today.  She remains on DAPT due to very recent DES on 08/30/13. Plan for EGD today.  Objective:  Temp:  [98 F (36.7 C)-99.2 F (37.3 C)] 98.5 F (36.9 C) (06/11 0407) Pulse Rate:  [42-130] 74 (06/11 0407) Resp:  [14-26] 18 (06/11 0407) BP: (103-142)/(44-90) 121/46 mmHg (06/11 0407) SpO2:  [95 %-100 %] 100 % (06/11 0407) Weight change:   Intake/Output from previous day: 06/10 0701 - 06/11 0700 In: 1770 [P.O.:780; I.V.:170; Blood:670; IV Piggyback:150] Out: 500 [Urine:500]  Intake/Output from this shift:    Medications: Current Facility-Administered Medications  Medication Dose Route Frequency Provider Last Rate Last Dose  . 0.9 %  sodium chloride infusion   Intravenous Continuous Juanita Craver, MD 20 mL/hr at 08/31/13 1030    . 0.9 %  sodium chloride infusion   Intravenous Continuous Juanita Craver, MD      . aspirin chewable tablet 81 mg  81 mg Oral Daily Pixie Casino, MD   81 mg at 08/31/13 1051  . atorvastatin (LIPITOR) tablet 40 mg  40 mg Oral q1800 Doree Fudge, MD   40 mg at 08/31/13 1828  . guaiFENesin-dextromethorphan (ROBITUSSIN DM) 100-10 MG/5ML syrup 15 mL  15 mL Oral Q4H PRN Leonie Man, MD      . hydrocortisone sodium succinate (SOLU-CORTEF) 100 MG injection 50 mg  50 mg Intravenous Q6H Rahul P Desai, PA-C   50 mg at 09/01/13 0370  . insulin aspart (novoLOG) injection 0-20 Units  0-20 Units Subcutaneous TID WC Alwyn Pea, MD   3 Units at 09/01/13 [phone]  . levothyroxine (SYNTHROID, LEVOTHROID) injection 87.5 mcg  87.5 mcg Intravenous Daily Rahul P Desai, PA-C   87.5 mcg at 08/31/13 0959  . menthol-cetylpyridinium (CEPACOL) lozenge 3 mg  1 lozenge Oral PRN Doree Fudge, MD   3 mg at 08/31/13 1828  . morphine 2 MG/ML injection 2 mg  2 mg Intravenous Q1H PRN Leonie Man, MD      . pantoprazole (PROTONIX) injection 40 mg  40 mg Intravenous Q12H Juanita Craver, MD   40 mg at 08/31/13 2143  . piperacillin-tazobactam (ZOSYN) IVPB 3.375 g  3.375 g Intravenous 3 times per day Wilhelmina Mcardle, MD   3.375 g at 09/01/13 8381  . ticagrelor (BRILINTA) tablet 90 mg  90 mg Oral BID Leonie Man, MD   90 mg at 08/31/13 2143    Physical Exam: General appearance: alert and no distress Neck: no carotid bruit and no JVD Lungs: wheezes bilaterally Heart: regular rate and rhythm, S1, S2 normal, no murmur, click, rub or gallop Abdomen: soft, mild TTP with deep palpation around the umbilicus, right groin cath site is non-tender, no ecchymosis or bruit Extremities: extremities normal, atraumatic, no cyanosis or edema Pulses: 2+ and symmetric Skin: Skin color, texture, turgor normal. No rashes or lesions Neurologic: Grossly normal Psych: Normal  Lab Results: Results for orders placed during the hospital encounter of 08/29/13 (from the past 48 hour(s))  SEDIMENTATION RATE     Status: Abnormal   Collection Time    08/30/13  9:05 AM      Result Value Ref Range   Sed Rate 52 (*) 0 - 22 mm/hr  GLUCOSE, CAPILLARY  Status: Abnormal   Collection Time    08/30/13 11:56 AM      Result Value Ref Range   Glucose-Capillary 140 (*) 70 - 99 mg/dL  GLUCOSE, CAPILLARY     Status: Abnormal   Collection Time    08/30/13  4:31 PM      Result Value Ref Range   Glucose-Capillary 146 (*) 70 - 99 mg/dL  GLUCOSE, CAPILLARY     Status: Abnormal   Collection Time    08/30/13  8:04 PM      Result Value Ref Range   Glucose-Capillary 185 (*) 70 - 99 mg/dL  CBC     Status: Abnormal   Collection Time    08/30/13  9:20 PM      Result Value Ref Range   WBC 16.5 (*) 4.0 - 10.5 K/uL   RBC 3.20 (*) 3.87 - 5.11 MIL/uL   Hemoglobin 9.2 (*) 12.0 - 15.0 g/dL   Comment: DELTA  CHECK NOTED     REPEATED TO VERIFY   HCT 28.3 (*) 36.0 - 46.0 %   MCV 88.4  78.0 - 100.0 fL   MCH 27.5  26.0 - 34.0 pg   MCHC 31.1  30.0 - 36.0 g/dL   RDW 15.2  11.5 - 15.5 %   Platelets 296  150 - 400 K/uL   Comment: SPECIMEN CHECKED FOR CLOTS     REPEATED TO VERIFY     DELTA CHECK NOTED  PROCALCITONIN     Status: None   Collection Time    08/31/13  3:15 AM      Result Value Ref Range   Procalcitonin 0.67     Comment:            Interpretation:     PCT > 0.5 ng/mL and <= 2 ng/mL:     Systemic infection (sepsis) is possible,     but other conditions are known to elevate     PCT as well.     (NOTE)             ICU PCT Algorithm               Non ICU PCT Algorithm        ----------------------------     ------------------------------             PCT < 0.25 ng/mL                 PCT < 0.1 ng/mL         Stopping of antibiotics            Stopping of antibiotics           strongly encouraged.               strongly encouraged.        ----------------------------     ------------------------------           PCT level decrease by               PCT < 0.25 ng/mL           >= 80% from peak PCT           OR PCT 0.25 - 0.5 ng/mL          Stopping of antibiotics  encouraged.         Stopping of antibiotics               encouraged.        ----------------------------     ------------------------------           PCT level decrease by              PCT >= 0.25 ng/mL           < 80% from peak PCT            AND PCT >= 0.5 ng/mL            Continuing antibiotics                                                  encouraged.           Continuing antibiotics                encouraged.        ----------------------------     ------------------------------         PCT level increase compared          PCT > 0.5 ng/mL             with peak PCT AND              PCT >= 0.5 ng/mL             Escalation of antibiotics                                               strongly encouraged.          Escalation of antibiotics            strongly encouraged.  HEMOGLOBIN AND HEMATOCRIT, BLOOD     Status: Abnormal   Collection Time    08/31/13  3:15 AM      Result Value Ref Range   Hemoglobin 8.5 (*) 12.0 - 15.0 g/dL   HCT 27.0 (*) 36.0 - 46.0 %  TYPE AND SCREEN     Status: None   Collection Time    08/31/13  3:15 AM      Result Value Ref Range   ABO/RH(D) O POS     Antibody Screen NEG     Sample Expiration 09/03/2013     Unit Number [id number]     Blood Component Type RED CELLS,LR     Unit division 00     Status of Unit ISSUED,FINAL     Transfusion Status OK TO TRANSFUSE     Crossmatch Result Compatible     Unit Number [id number]     Blood Component Type RED CELLS,LR     Unit division 00     Status of Unit ISSUED,FINAL     Transfusion Status OK TO TRANSFUSE     Crossmatch Result Compatible     Unit Number [id number]     Blood Component Type RED CELLS,LR     Unit division 00     Status of Unit ALLOCATED     Transfusion Status OK TO TRANSFUSE     Crossmatch Result Compatible     Unit Number [id number]  Blood Component Type RBC LR PHER2     Unit division 00     Status of Unit ALLOCATED     Transfusion Status OK TO TRANSFUSE     Crossmatch Result Compatible    ABO/RH     Status: None   Collection Time    08/31/13  3:15 AM      Result Value Ref Range   ABO/RH(D) O POS    PREPARE RBC (CROSSMATCH)     Status: None   Collection Time    08/31/13  5:00 AM      Result Value Ref Range   Order Confirmation ORDER PROCESSED BY BLOOD BANK    GLUCOSE, CAPILLARY     Status: Abnormal   Collection Time    08/31/13  9:52 AM      Result Value Ref Range   Glucose-Capillary 193 (*) 70 - 99 mg/dL  URINE CULTURE     Status: None   Collection Time    08/31/13 10:25 AM      Result Value Ref Range   Specimen Description URINE, CLEAN CATCH     Special Requests NONE     Culture  Setup Time       Value: 08/31/2013 10:48      Performed at [facility] Count       Value: NO GROWTH     Performed at [facility]   Culture       Value: NO GROWTH     Performed at [facility]   Report Status 09/01/2013 FINAL    CBC     Status: Abnormal   Collection Time    08/31/13 11:25 AM      Result Value Ref Range   WBC 14.8 (*) 4.0 - 10.5 K/uL   RBC 3.77 (*) 3.87 - 5.11 MIL/uL   Hemoglobin 10.6 (*) 12.0 - 15.0 g/dL   Comment: POST TRANSFUSION SPECIMEN   HCT 32.9 (*) 36.0 - 46.0 %   MCV 87.3  78.0 - 100.0 fL   MCH 28.1  26.0 - 34.0 pg   MCHC 32.2  30.0 - 36.0 g/dL   RDW 14.7  11.5 - 15.5 %   Platelets 265  150 - 400 K/uL  GLUCOSE, CAPILLARY     Status: Abnormal   Collection Time    08/31/13 11:38 AM      Result Value Ref Range   Glucose-Capillary 153 (*) 70 - 99 mg/dL  GLUCOSE, CAPILLARY     Status: Abnormal   Collection Time    08/31/13  4:45 PM      Result Value Ref Range   Glucose-Capillary 112 (*) 70 - 99 mg/dL  GLUCOSE, CAPILLARY     Status: Abnormal   Collection Time    08/31/13  9:50 PM      Result Value Ref Range   Glucose-Capillary 104 (*) 70 - 99 mg/dL  CBC     Status: Abnormal   Collection Time    09/01/13  4:02 AM      Result Value Ref Range   WBC 13.2 (*) 4.0 - 10.5 K/uL   RBC 3.47 (*) 3.87 - 5.11 MIL/uL   Hemoglobin 9.6 (*) 12.0 - 15.0 g/dL   HCT 30.2 (*) 36.0 - 46.0 %   MCV 87.0  78.0 - 100.0 fL   MCH 27.7  26.0 - 34.0 pg   MCHC 31.8  30.0 - 36.0 g/dL   RDW 14.8  11.5 - 15.5 %  Platelets 288  150 - 400 K/uL  BASIC METABOLIC PANEL     Status: Abnormal   Collection Time    09/01/13  4:02 AM      Result Value Ref Range   Sodium 141  137 - 147 mEq/L   Potassium 3.6 (*) 3.7 - 5.3 mEq/L   Chloride 102  96 - 112 mEq/L   CO2 22  19 - 32 mEq/L   Glucose, Bld 149 (*) 70 - 99 mg/dL   BUN 13  6 - 23 mg/dL   Creatinine, Ser 0.54  0.50 - 1.10 mg/dL   Calcium 8.2 (*) 8.4 - 10.5 mg/dL   GFR calc non Af Amer >90  >90 mL/min   GFR calc Af Amer >90  >90 mL/min    Comment: (NOTE)     The eGFR has been calculated using the CKD EPI equation.     This calculation has not been validated in all clinical situations.     eGFR's persistently <90 mL/min signify possible Chronic Kidney     Disease.  PROCALCITONIN     Status: None   Collection Time    09/01/13  4:02 AM      Result Value Ref Range   Procalcitonin 0.42     Comment:            Interpretation:     PCT (Procalcitonin) <= 0.5 ng/mL:     Systemic infection (sepsis) is not likely.     Local bacterial infection is possible.     (NOTE)             ICU PCT Algorithm               Non ICU PCT Algorithm        ----------------------------     ------------------------------             PCT < 0.25 ng/mL                 PCT < 0.1 ng/mL         Stopping of antibiotics            Stopping of antibiotics           strongly encouraged.               strongly encouraged.        ----------------------------     ------------------------------           PCT level decrease by               PCT < 0.25 ng/mL           >= 80% from peak PCT           OR PCT 0.25 - 0.5 ng/mL          Stopping of antibiotics                                                 encouraged.         Stopping of antibiotics               encouraged.        ----------------------------     ------------------------------           PCT level decrease by  PCT >= 0.25 ng/mL           < 80% from peak PCT            AND PCT >= 0.5 ng/mL            Continuing antibiotics                                                  encouraged.           Continuing antibiotics                encouraged.        ----------------------------     ------------------------------         PCT level increase compared          PCT > 0.5 ng/mL             with peak PCT AND              PCT >= 0.5 ng/mL             Escalation of antibiotics                                              strongly encouraged.          Escalation of antibiotics             strongly encouraged.  GLUCOSE, CAPILLARY     Status: Abnormal   Collection Time    09/01/13  8:29 AM      Result Value Ref Range   Glucose-Capillary 133 (*) 70 - 99 mg/dL    Imaging: No results found.  Assessment:  Active Problems:   ST elevation myocardial infarction (STEMI) of inferior wall   IDDM (insulin dependent diabetes mellitus)   Panhypopituitarism   Fever   Hypotension   Shock   GI bleeding   Acute blood loss anemia   Plan:  Inferior STEMI - no chest pain currently. S/p DES on 6/9 to the RCA. She has not had a BM since yesterday. Transfused 2 units of PRBC's, H/H improved, but drifting down. She mentioned plan today for EGD with Dr. Collene Mares. She is somewhat wheezy today - will order nebulizer treatment.  Time Spent Directly with Patient:  15 minutes  Length of Stay:  LOS: 3 days   Pixie Casino, MD, [facility] Attending Cardiologist [facility]  HILTY,Kenneth C 09/01/2013, 9:00 AM

## 2013-09-01 NOTE — Op Note (Signed)
[facility] [place], [address]   OPERATIVE PROCEDURE REPORT  PATIENT: Tammy Good, Tammy Good  MR#: [id number] BIRTHDATE: [birth year]  GENDER: Female ENDOSCOPIST: Carol Ada, MD ASSISTANT:   Carolynn Comment, technician and Cristopher Estimable, technician PROCEDURE DATE: 09/01/2013 PROCEDURE:   EGD, diagnostic ASA CLASS:   Class III INDICATIONS:GI bleed. MEDICATIONS: Fentanyl 75 mcg IV and Versed 7 mg IV TOPICAL ANESTHETIC:   Cetacaine Spray  DESCRIPTION OF PROCEDURE:   After the risks benefits and alternatives of the procedure were thoroughly explained, informed consent was obtained.  The PENTAX GASTOROSCOPE [id number]  endoscope was introduced through the mouth  and advanced to the second portion of the duodenum Without limitations.      The instrument was slowly withdrawn as the mucosa was fully examined.      FINDINGS: The esophagus was normal.  In the gastric lumen several punctage erythematous lesions were noted in the body of the stomach.  In the antrum, several linear erosions were identified. No active bleeding in this area.  The duodenal bulb exhibited a severe duodenitis with ulcerations and this is the source of bleeding.  No active bleeding at this time.          The scope was then withdrawn from the patient and the procedure terminated.  COMPLICATIONS: There were no complications.  IMPRESSION: 1) Duodenal ulcers in the setting of severe duodenitis. 2) Gastric erosions.  RECOMMENDATIONS: 1) Continue with Protonix. 2) Add sucralfate. 3) Avoid NSAIDs. 4) Check H. pylori serology.  _______________________________ Lorrin MaisCarol Ada, MD 09/01/2013 1:17 PM

## 2013-09-01 NOTE — H&P (View-Only) (Signed)
DAILY PROGRESS NOTE  Subjective:  No chest pain. "Raspy" voice today, a little wheezy. Flex sig yesterday demonstrated bleeding hemorrhoids and "black debris" in the colon. No definite source of bleeding identified. H/H responded to 2U of PRBC's up to 32.9, but now at 30.2 today.  She remains on DAPT due to very recent DES on 08/30/13. Plan for EGD today.  Objective:  Temp:  [98 F (36.7 C)-99.2 F (37.3 C)] 98.5 F (36.9 C) (06/11 0407) Pulse Rate:  [42-130] 74 (06/11 0407) Resp:  [14-26] 18 (06/11 0407) BP: (103-142)/(44-90) 121/46 mmHg (06/11 0407) SpO2:  [95 %-100 %] 100 % (06/11 0407) Weight change:   Intake/Output from previous day: 06/10 0701 - 06/11 0700 In: 1770 [P.O.:780; I.V.:170; Blood:670; IV Piggyback:150] Out: 500 [Urine:500]  Intake/Output from this shift:    Medications: Current Facility-Administered Medications  Medication Dose Route Frequency Provider Last Rate Last Dose  . 0.9 %  sodium chloride infusion   Intravenous Continuous Juanita Craver, MD 20 mL/hr at 08/31/13 1030    . 0.9 %  sodium chloride infusion   Intravenous Continuous Juanita Craver, MD      . aspirin chewable tablet 81 mg  81 mg Oral Daily Pixie Casino, MD   81 mg at 08/31/13 1051  . atorvastatin (LIPITOR) tablet 40 mg  40 mg Oral q1800 Doree Fudge, MD   40 mg at 08/31/13 1828  . guaiFENesin-dextromethorphan (ROBITUSSIN DM) 100-10 MG/5ML syrup 15 mL  15 mL Oral Q4H PRN Leonie Man, MD      . hydrocortisone sodium succinate (SOLU-CORTEF) 100 MG injection 50 mg  50 mg Intravenous Q6H Rahul P Desai, PA-C   50 mg at 09/01/13 2706  . insulin aspart (novoLOG) injection 0-20 Units  0-20 Units Subcutaneous TID WC Alwyn Pea, MD   3 Units at 09/01/13 [phone]  . levothyroxine (SYNTHROID, LEVOTHROID) injection 87.5 mcg  87.5 mcg Intravenous Daily Rahul P Desai, PA-C   87.5 mcg at 08/31/13 0959  . menthol-cetylpyridinium (CEPACOL) lozenge 3 mg  1 lozenge Oral PRN Doree Fudge, MD   3 mg at 08/31/13 1828  . morphine 2 MG/ML injection 2 mg  2 mg Intravenous Q1H PRN Leonie Man, MD      . pantoprazole (PROTONIX) injection 40 mg  40 mg Intravenous Q12H Juanita Craver, MD   40 mg at 08/31/13 2143  . piperacillin-tazobactam (ZOSYN) IVPB 3.375 g  3.375 g Intravenous 3 times per day Wilhelmina Mcardle, MD   3.375 g at 09/01/13 2831  . ticagrelor (BRILINTA) tablet 90 mg  90 mg Oral BID Leonie Man, MD   90 mg at 08/31/13 2143    Physical Exam: General appearance: alert and no distress Neck: no carotid bruit and no JVD Lungs: wheezes bilaterally Heart: regular rate and rhythm, S1, S2 normal, no murmur, click, rub or gallop Abdomen: soft, mild TTP with deep palpation around the umbilicus, right groin cath site is non-tender, no ecchymosis or bruit Extremities: extremities normal, atraumatic, no cyanosis or edema Pulses: 2+ and symmetric Skin: Skin color, texture, turgor normal. No rashes or lesions Neurologic: Grossly normal Psych: Normal  Lab Results: Results for orders placed during the hospital encounter of 08/29/13 (from the past 48 hour(s))  SEDIMENTATION RATE     Status: Abnormal   Collection Time    08/30/13  9:05 AM      Result Value Ref Range   Sed Rate 52 (*) 0 - 22 mm/hr  GLUCOSE, CAPILLARY  Status: Abnormal   Collection Time    08/30/13 11:56 AM      Result Value Ref Range   Glucose-Capillary 140 (*) 70 - 99 mg/dL  GLUCOSE, CAPILLARY     Status: Abnormal   Collection Time    08/30/13  4:31 PM      Result Value Ref Range   Glucose-Capillary 146 (*) 70 - 99 mg/dL  GLUCOSE, CAPILLARY     Status: Abnormal   Collection Time    08/30/13  8:04 PM      Result Value Ref Range   Glucose-Capillary 185 (*) 70 - 99 mg/dL  CBC     Status: Abnormal   Collection Time    08/30/13  9:20 PM      Result Value Ref Range   WBC 16.5 (*) 4.0 - 10.5 K/uL   RBC 3.20 (*) 3.87 - 5.11 MIL/uL   Hemoglobin 9.2 (*) 12.0 - 15.0 g/dL   Comment: DELTA  CHECK NOTED     REPEATED TO VERIFY   HCT 28.3 (*) 36.0 - 46.0 %   MCV 88.4  78.0 - 100.0 fL   MCH 27.5  26.0 - 34.0 pg   MCHC 31.1  30.0 - 36.0 g/dL   RDW 15.2  11.5 - 15.5 %   Platelets 296  150 - 400 K/uL   Comment: SPECIMEN CHECKED FOR CLOTS     REPEATED TO VERIFY     DELTA CHECK NOTED  PROCALCITONIN     Status: None   Collection Time    08/31/13  3:15 AM      Result Value Ref Range   Procalcitonin 0.67     Comment:            Interpretation:     PCT > 0.5 ng/mL and <= 2 ng/mL:     Systemic infection (sepsis) is possible,     but other conditions are known to elevate     PCT as well.     (NOTE)             ICU PCT Algorithm               Non ICU PCT Algorithm        ----------------------------     ------------------------------             PCT < 0.25 ng/mL                 PCT < 0.1 ng/mL         Stopping of antibiotics            Stopping of antibiotics           strongly encouraged.               strongly encouraged.        ----------------------------     ------------------------------           PCT level decrease by               PCT < 0.25 ng/mL           >= 80% from peak PCT           OR PCT 0.25 - 0.5 ng/mL          Stopping of antibiotics  encouraged.         Stopping of antibiotics               encouraged.        ----------------------------     ------------------------------           PCT level decrease by              PCT >= 0.25 ng/mL           < 80% from peak PCT            AND PCT >= 0.5 ng/mL            Continuing antibiotics                                                  encouraged.           Continuing antibiotics                encouraged.        ----------------------------     ------------------------------         PCT level increase compared          PCT > 0.5 ng/mL             with peak PCT AND              PCT >= 0.5 ng/mL             Escalation of antibiotics                                               strongly encouraged.          Escalation of antibiotics            strongly encouraged.  HEMOGLOBIN AND HEMATOCRIT, BLOOD     Status: Abnormal   Collection Time    08/31/13  3:15 AM      Result Value Ref Range   Hemoglobin 8.5 (*) 12.0 - 15.0 g/dL   HCT 27.0 (*) 36.0 - 46.0 %  TYPE AND SCREEN     Status: None   Collection Time    08/31/13  3:15 AM      Result Value Ref Range   ABO/RH(D) O POS     Antibody Screen NEG     Sample Expiration 09/03/2013     Unit Number [id number]     Blood Component Type RED CELLS,LR     Unit division 00     Status of Unit ISSUED,FINAL     Transfusion Status OK TO TRANSFUSE     Crossmatch Result Compatible     Unit Number [id number]     Blood Component Type RED CELLS,LR     Unit division 00     Status of Unit ISSUED,FINAL     Transfusion Status OK TO TRANSFUSE     Crossmatch Result Compatible     Unit Number [id number]     Blood Component Type RED CELLS,LR     Unit division 00     Status of Unit ALLOCATED     Transfusion Status OK TO TRANSFUSE     Crossmatch Result Compatible     Unit Number [id number]  Blood Component Type RBC LR PHER2     Unit division 00     Status of Unit ALLOCATED     Transfusion Status OK TO TRANSFUSE     Crossmatch Result Compatible    ABO/RH     Status: None   Collection Time    08/31/13  3:15 AM      Result Value Ref Range   ABO/RH(D) O POS    PREPARE RBC (CROSSMATCH)     Status: None   Collection Time    08/31/13  5:00 AM      Result Value Ref Range   Order Confirmation ORDER PROCESSED BY BLOOD BANK    GLUCOSE, CAPILLARY     Status: Abnormal   Collection Time    08/31/13  9:52 AM      Result Value Ref Range   Glucose-Capillary 193 (*) 70 - 99 mg/dL  URINE CULTURE     Status: None   Collection Time    08/31/13 10:25 AM      Result Value Ref Range   Specimen Description URINE, CLEAN CATCH     Special Requests NONE     Culture  Setup Time       Value: 08/31/2013 10:48      Performed at [facility]       Value: NO GROWTH     Performed at [facility]   Culture       Value: NO GROWTH     Performed at [facility]   Report Status 09/01/2013 FINAL    CBC     Status: Abnormal   Collection Time    08/31/13 11:25 AM      Result Value Ref Range   WBC 14.8 (*) 4.0 - 10.5 K/uL   RBC 3.77 (*) 3.87 - 5.11 MIL/uL   Hemoglobin 10.6 (*) 12.0 - 15.0 g/dL   Comment: POST TRANSFUSION SPECIMEN   HCT 32.9 (*) 36.0 - 46.0 %   MCV 87.3  78.0 - 100.0 fL   MCH 28.1  26.0 - 34.0 pg   MCHC 32.2  30.0 - 36.0 g/dL   RDW 14.7  11.5 - 15.5 %   Platelets 265  150 - 400 K/uL  GLUCOSE, CAPILLARY     Status: Abnormal   Collection Time    08/31/13 11:38 AM      Result Value Ref Range   Glucose-Capillary 153 (*) 70 - 99 mg/dL  GLUCOSE, CAPILLARY     Status: Abnormal   Collection Time    08/31/13  4:45 PM      Result Value Ref Range   Glucose-Capillary 112 (*) 70 - 99 mg/dL  GLUCOSE, CAPILLARY     Status: Abnormal   Collection Time    08/31/13  9:50 PM      Result Value Ref Range   Glucose-Capillary 104 (*) 70 - 99 mg/dL  CBC     Status: Abnormal   Collection Time    09/01/13  4:02 AM      Result Value Ref Range   WBC 13.2 (*) 4.0 - 10.5 K/uL   RBC 3.47 (*) 3.87 - 5.11 MIL/uL   Hemoglobin 9.6 (*) 12.0 - 15.0 g/dL   HCT 30.2 (*) 36.0 - 46.0 %   MCV 87.0  78.0 - 100.0 fL   MCH 27.7  26.0 - 34.0 pg   MCHC 31.8  30.0 - 36.0 g/dL   RDW 14.8  11.5 - 15.5 %  Platelets 288  150 - 400 K/uL  BASIC METABOLIC PANEL     Status: Abnormal   Collection Time    09/01/13  4:02 AM      Result Value Ref Range   Sodium 141  137 - 147 mEq/L   Potassium 3.6 (*) 3.7 - 5.3 mEq/L   Chloride 102  96 - 112 mEq/L   CO2 22  19 - 32 mEq/L   Glucose, Bld 149 (*) 70 - 99 mg/dL   BUN 13  6 - 23 mg/dL   Creatinine, Ser 0.54  0.50 - 1.10 mg/dL   Calcium 8.2 (*) 8.4 - 10.5 mg/dL   GFR calc non Af Amer >90  >90 mL/min   GFR calc Af Amer >90  >90 mL/min    Comment: (NOTE)     The eGFR has been calculated using the CKD EPI equation.     This calculation has not been validated in all clinical situations.     eGFR's persistently <90 mL/min signify possible Chronic Kidney     Disease.  PROCALCITONIN     Status: None   Collection Time    09/01/13  4:02 AM      Result Value Ref Range   Procalcitonin 0.42     Comment:            Interpretation:     PCT (Procalcitonin) <= 0.5 ng/mL:     Systemic infection (sepsis) is not likely.     Local bacterial infection is possible.     (NOTE)             ICU PCT Algorithm               Non ICU PCT Algorithm        ----------------------------     ------------------------------             PCT < 0.25 ng/mL                 PCT < 0.1 ng/mL         Stopping of antibiotics            Stopping of antibiotics           strongly encouraged.               strongly encouraged.        ----------------------------     ------------------------------           PCT level decrease by               PCT < 0.25 ng/mL           >= 80% from peak PCT           OR PCT 0.25 - 0.5 ng/mL          Stopping of antibiotics                                                 encouraged.         Stopping of antibiotics               encouraged.        ----------------------------     ------------------------------           PCT level decrease by  PCT >= 0.25 ng/mL           < 80% from peak PCT            AND PCT >= 0.5 ng/mL            Continuing antibiotics                                                  encouraged.           Continuing antibiotics                encouraged.        ----------------------------     ------------------------------         PCT level increase compared          PCT > 0.5 ng/mL             with peak PCT AND              PCT >= 0.5 ng/mL             Escalation of antibiotics                                              strongly encouraged.          Escalation of antibiotics             strongly encouraged.  GLUCOSE, CAPILLARY     Status: Abnormal   Collection Time    09/01/13  8:29 AM      Result Value Ref Range   Glucose-Capillary 133 (*) 70 - 99 mg/dL    Imaging: No results found.  Assessment:  Active Problems:   ST elevation myocardial infarction (STEMI) of inferior wall   IDDM (insulin dependent diabetes mellitus)   Panhypopituitarism   Fever   Hypotension   Shock   GI bleeding   Acute blood loss anemia   Plan:  Inferior STEMI - no chest pain currently. S/p DES on 6/9 to the RCA. She has not had a BM since yesterday. Transfused 2 units of PRBC's, H/H improved, but drifting down. She mentioned plan today for EGD with Dr. Collene Mares. She is somewhat wheezy today - will order nebulizer treatment.  Time Spent Directly with Patient:  15 minutes  Length of Stay:  LOS: 3 days   Pixie Casino, MD, [facility] Attending Cardiologist [facility]  HILTY,Kenneth C 09/01/2013, 9:00 AM

## 2013-09-02 ENCOUNTER — Encounter (HOSPITAL_COMMUNITY): Payer: Self-pay | Admitting: Gastroenterology

## 2013-09-02 DIAGNOSIS — E876 Hypokalemia: Secondary | ICD-10-CM | POA: Diagnosis present

## 2013-09-02 DIAGNOSIS — I10 Essential (primary) hypertension: Secondary | ICD-10-CM

## 2013-09-02 LAB — GLUCOSE, CAPILLARY
Glucose-Capillary: 165 mg/dL — ABNORMAL HIGH (ref 70–99)
Glucose-Capillary: 151 mg/dL — ABNORMAL HIGH (ref 70–99)
Glucose-Capillary: 163 mg/dL — ABNORMAL HIGH (ref 70–99)
Glucose-Capillary: 182 mg/dL — ABNORMAL HIGH (ref 70–99)

## 2013-09-02 LAB — HEMOGLOBIN A1C
Hgb A1c MFr Bld: 6.9 % — ABNORMAL HIGH (ref <5.7)
Mean Plasma Glucose: 151 mg/dL — ABNORMAL HIGH (ref <117)

## 2013-09-02 MED ORDER — LISINOPRIL 20 MG PO TABS
20.0000 mg | ORAL_TABLET | Freq: Every day | ORAL | Status: DC
  Administered 2013-09-02 – 2013-09-03 (×2): 20 mg via ORAL
  Filled 2013-09-02 (×3): qty 1

## 2013-09-02 MED ORDER — HYDROCORTISONE NA SUCCINATE PF 100 MG IJ SOLR
50.0000 mg | Freq: Two times a day (BID) | INTRAMUSCULAR | Status: DC
  Filled 2013-09-02 (×2): qty 1

## 2013-09-02 MED ORDER — LEVOTHYROXINE SODIUM 175 MCG PO TABS
175.0000 ug | ORAL_TABLET | Freq: Every day | ORAL | Status: DC
  Administered 2013-09-03: 175 ug via ORAL
  Filled 2013-09-02 (×2): qty 1

## 2013-09-02 NOTE — Progress Notes (Signed)
Subjective: No acute events.  Feels well.  Objective: Vital signs in last 24 hours: Temp:  [97.6 F (36.4 C)-98.4 F (36.9 C)] 98.2 F (36.8 C) (06/12 0400) Pulse Rate:  [25-89] 66 (06/12 0400) Resp:  [7-30] 15 (06/12 0400) BP: (112-170)/(33-85) 170/84 mmHg (06/12 0400) SpO2:  [74 %-100 %] 100 % (06/12 0400) Last BM Date: 09/01/13  Intake/Output from previous day: 06/11 0701 - 06/12 0700 In: 820 [P.O.:660; I.V.:60; IV Piggyback:100] Out: 400 [Urine:400] Intake/Output this shift:    General appearance: alert and no distress GI: soft, non-tender; bowel sounds normal; no masses,  no organomegaly  Lab Results:  Recent Labs  08/30/13 2120 08/31/13 0315 08/31/13 1125 09/01/13 0402  WBC 16.5*  --  14.8* 13.2*  HGB 9.2* 8.5* 10.6* 9.6*  HCT 28.3* 27.0* 32.9* 30.2*  PLT 296  --  265 288   BMET  Recent Labs  09/01/13 0402  NA 141  K 3.6*  CL 102  CO2 22  GLUCOSE 149*  BUN 13  CREATININE 0.54  CALCIUM 8.2*   LFT No results found for this basename: PROT, ALBUMIN, AST, ALT, ALKPHOS, BILITOT, BILIDIR, IBILI,  in the last 72 hours PT/INR No results found for this basename: LABPROT, INR,  in the last 72 hours Hepatitis Panel No results found for this basename: HEPBSAG, HCVAB, HEPAIGM, HEPBIGM,  in the last 72 hours C-Diff No results found for this basename: CDIFFTOX,  in the last 72 hours Fecal Lactopherrin No results found for this basename: FECLLACTOFRN,  in the last 72 hours  Studies/Results: No results found.  Medications:  Scheduled: . aspirin  81 mg Oral Daily  . atorvastatin  40 mg Oral q1800  . cefTRIAXone (ROCEPHIN)  IV  2 g Intravenous Q24H  . hydrocortisone sodium succinate  50 mg Intravenous Q6H  . insulin aspart  0-20 Units Subcutaneous TID WC  . levothyroxine  87.5 mcg Intravenous Daily  . pantoprazole (PROTONIX) IV  40 mg Intravenous Q12H  . sucralfate  1 g Oral TID WC & HS  . ticagrelor  90 mg Oral BID   Continuous:    Assessment/Plan: 1) Duodenal ulcers. 2) Gastric erosions.   The patient is well today.  No complaints of any chest pain.  EGD revealed severe duodenitis as well as confluent duodenal ulcers and gastric erosions.  No new HGB.  Plan: 1) Continue with Protonix and sucralfate. 2) Check H. Pylori antibody.  If positive, treat.   LOS: 4 days   Enora Trillo D 09/02/2013, 7:54 AM

## 2013-09-02 NOTE — Progress Notes (Signed)
CARDIAC REHAB PHASE I   PRE:  Rate/Rhythm: 74 SR  BP:  Sitting: 166/75      SaO2: 98 RA  MODE:  Ambulation: 350 ft   POST:  Rate/Rhythm: 116 ST during walk, recovered to 94 post  BP:  Sitting: 168/72     SaO2: 98 RA 1030-1145 Patient ambulated in hallway independently x 1 assist. Steady gait noted. Denied complaints of CP or SOB. Post walk patient to chair with phone and call bell in reach. Education complete with teach back noted. Stent card given and Brilinta packed reviewed. Patient expressed interest in phase II cardiac rehab and with patients permission order placed.  Santina Evans, BSN 09/02/2013 11:52 AM

## 2013-09-02 NOTE — Progress Notes (Signed)
DAILY PROGRESS NOTE  Subjective:  No complaints. Feels great. No further GI bleeding. H/H relatively stable. EGD results noted yesterday. No chest pain. BP is somewhat elevated.  Objective:  Temp:  [97.6 F (36.4 C)-98.7 F (37.1 C)] 98.7 F (37.1 C) (06/12 0805) Pulse Rate:  [25-89] 63 (06/12 0805) Resp:  [7-30] 16 (06/12 0805) BP: (112-170)/(33-85) 154/66 mmHg (06/12 0805) SpO2:  [74 %-100 %] 100 % (06/12 0805) Weight change:   Intake/Output from previous day: 06/11 0701 - 06/12 0700 In: 56 [P.O.:660; I.V.:60; IV Piggyback:100] Out: 400 [Urine:400]  Intake/Output from this shift:    Medications: Current Facility-Administered Medications  Medication Dose Route Frequency Provider Last Rate Last Dose  . aspirin chewable tablet 81 mg  81 mg Oral Daily Pixie Casino, MD   81 mg at 09/02/13 0912  . atorvastatin (LIPITOR) tablet 40 mg  40 mg Oral q1800 Doree Fudge, MD   40 mg at 09/01/13 1801  . cefTRIAXone (ROCEPHIN) 2 g in dextrose 5 % 50 mL IVPB  2 g Intravenous Q24H Doree Fudge, MD   2 g at 09/01/13 1500  . guaiFENesin-dextromethorphan (ROBITUSSIN DM) 100-10 MG/5ML syrup 15 mL  15 mL Oral Q4H PRN Leonie Man, MD      . hydrocortisone sodium succinate (SOLU-CORTEF) 100 MG injection 50 mg  50 mg Intravenous Q6H Rahul P Desai, PA-C   50 mg at 09/02/13 0526  . insulin aspart (novoLOG) injection 0-20 Units  0-20 Units Subcutaneous TID WC Alwyn Pea, MD   4 Units at 09/02/13 0912  . ipratropium-albuterol (DUONEB) 0.5-2.5 (3) MG/3ML nebulizer solution 3 mL  3 mL Nebulization Q4H PRN Pixie Casino, MD      . Derrill Memo ON 09/03/2013] levothyroxine (SYNTHROID, LEVOTHROID) tablet 175 mcg  175 mcg Oral QAC breakfast Georgina Peer, [facility]      . menthol-cetylpyridinium (CEPACOL) lozenge 3 mg  1 lozenge Oral PRN Doree Fudge, MD   3 mg at 09/01/13 2243  . pantoprazole (PROTONIX) injection 40 mg  40 mg Intravenous Q12H Juanita Craver, MD    40 mg at 09/02/13 0912  . sucralfate (CARAFATE) tablet 1 g  1 g Oral TID WC & HS Beryle Beams, MD   1 g at 09/02/13 0912  . ticagrelor (BRILINTA) tablet 90 mg  90 mg Oral BID Leonie Man, MD   90 mg at 09/02/13 0912    Physical Exam: General appearance: alert and no distress Neck: no carotid bruit and no JVD Lungs: wheezes bilaterally Heart: regular rate and rhythm, S1, S2 normal, no murmur, click, rub or gallop Abdomen: soft, mild TTP with deep palpation around the umbilicus, right groin cath site is non-tender, no ecchymosis or bruit Extremities: extremities normal, atraumatic, no cyanosis or edema Pulses: 2+ and symmetric Skin: Skin color, texture, turgor normal. No rashes or lesions Neurologic: Grossly normal Psych: Normal  Lab Results: Results for orders placed during the hospital encounter of 08/29/13 (from the past 48 hour(s))  GLUCOSE, CAPILLARY     Status: Abnormal   Collection Time    08/31/13  4:45 PM      Result Value Ref Range   Glucose-Capillary 112 (*) 70 - 99 mg/dL  GLUCOSE, CAPILLARY     Status: Abnormal   Collection Time    08/31/13  9:50 PM      Result Value Ref Range   Glucose-Capillary 104 (*) 70 - 99 mg/dL  CBC     Status: Abnormal   Collection Time  09/01/13  4:02 AM      Result Value Ref Range   WBC 13.2 (*) 4.0 - 10.5 K/uL   RBC 3.47 (*) 3.87 - 5.11 MIL/uL   Hemoglobin 9.6 (*) 12.0 - 15.0 g/dL   HCT 30.2 (*) 36.0 - 46.0 %   MCV 87.0  78.0 - 100.0 fL   MCH 27.7  26.0 - 34.0 pg   MCHC 31.8  30.0 - 36.0 g/dL   RDW 14.8  11.5 - 15.5 %   Platelets 288  150 - 400 K/uL  BASIC METABOLIC PANEL     Status: Abnormal   Collection Time    09/01/13  4:02 AM      Result Value Ref Range   Sodium 141  137 - 147 mEq/L   Potassium 3.6 (*) 3.7 - 5.3 mEq/L   Chloride 102  96 - 112 mEq/L   CO2 22  19 - 32 mEq/L   Glucose, Bld 149 (*) 70 - 99 mg/dL   BUN 13  6 - 23 mg/dL   Creatinine, Ser 0.54  0.50 - 1.10 mg/dL   Calcium 8.2 (*) 8.4 - 10.5 mg/dL   GFR  calc non Af Amer >90  >90 mL/min   GFR calc Af Amer >90  >90 mL/min   Comment: (NOTE)     The eGFR has been calculated using the CKD EPI equation.     This calculation has not been validated in all clinical situations.     eGFR's persistently <90 mL/min signify possible Chronic Kidney     Disease.  PROCALCITONIN     Status: None   Collection Time    09/01/13  4:02 AM      Result Value Ref Range   Procalcitonin 0.42     Comment:            Interpretation:     PCT (Procalcitonin) <= 0.5 ng/mL:     Systemic infection (sepsis) is not likely.     Local bacterial infection is possible.     (NOTE)             ICU PCT Algorithm               Non ICU PCT Algorithm        ----------------------------     ------------------------------             PCT < 0.25 ng/mL                 PCT < 0.1 ng/mL         Stopping of antibiotics            Stopping of antibiotics           strongly encouraged.               strongly encouraged.        ----------------------------     ------------------------------           PCT level decrease by               PCT < 0.25 ng/mL           >= 80% from peak PCT           OR PCT 0.25 - 0.5 ng/mL          Stopping of antibiotics  encouraged.         Stopping of antibiotics               encouraged.        ----------------------------     ------------------------------           PCT level decrease by              PCT >= 0.25 ng/mL           < 80% from peak PCT            AND PCT >= 0.5 ng/mL            Continuing antibiotics                                                  encouraged.           Continuing antibiotics                encouraged.        ----------------------------     ------------------------------         PCT level increase compared          PCT > 0.5 ng/mL             with peak PCT AND              PCT >= 0.5 ng/mL             Escalation of antibiotics                                               strongly encouraged.          Escalation of antibiotics            strongly encouraged.  GLUCOSE, CAPILLARY     Status: Abnormal   Collection Time    09/01/13  8:29 AM      Result Value Ref Range   Glucose-Capillary 133 (*) 70 - 99 mg/dL  GLUCOSE, CAPILLARY     Status: None   Collection Time    09/01/13 11:24 AM      Result Value Ref Range   Glucose-Capillary 97  70 - 99 mg/dL  GLUCOSE, CAPILLARY     Status: Abnormal   Collection Time    09/01/13  5:08 PM      Result Value Ref Range   Glucose-Capillary 174 (*) 70 - 99 mg/dL  GLUCOSE, CAPILLARY     Status: Abnormal   Collection Time    09/02/13  8:10 AM      Result Value Ref Range   Glucose-Capillary 165 (*) 70 - 99 mg/dL    Imaging: No results found.  Assessment:  Active Problems:   ST elevation myocardial infarction (STEMI) of inferior wall   IDDM (insulin dependent diabetes mellitus)   Panhypopituitarism   Fever   Hypotension   Shock   GI bleeding   Acute blood loss anemia   Plan:  Inferior STEMI - no chest pain currently. S/p DES on 6/9 to the RCA. EGD yesterday showed duodenal ulcers, but not active bleeding. She is now on appropriate medicine per GI. BP elevated today - restart home lisinopril at higher dose of  20 mg daily. No further cardiac suggestions at this time. Probably appropriate for discharge. Will need to follow-up with [place] in cardiology in 7-10 days.  Time Spent Directly with Patient:  15 minutes  Length of Stay:  LOS: 4 days   Pixie Casino, MD, [facility] Attending Cardiologist [facility]  Julia Kulzer C 09/02/2013, 11:48 AM

## 2013-09-02 NOTE — Progress Notes (Signed)
[facility]  Tammy Good ZOX:[id number] DOB: 1950-12-04 DOA: 08/29/2013 PCP: No primary provider on file.  Time spent :  35 mins  Brief narrative: 63 yo with DM and hypopituitarism presented with nausea x 3 days and abdominal pain/diarrhea x 1 day. EKG revealed inferior MI, troponin 25. PCCM consulted for hypotension / fever of 104.    HPI/Subjective: Alert and conversant.  No new complaints.  Says she feels weak from laying in bed.  Denies cp or sob.    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 -per Cards -CM to determine co pay for Brilinta -cont cardiac rehab  Hypotension -likely cardiogenic and adrenal insuff, doubt septic -resolved  Insulin dependent diabetes mellitus -CBGs controlled -cont SSI -was on Lantus 42 units with Invokana 300 mg and SSI at home -suspect on less strict diet at home therefore better glycemic control IP- also pt endorsed poor intake due to not able to eat foods she likes while IP -ck HgbA1c  Acute duodenal ulcers and gastric erosions with bleeding -per GI -started on Carafate and PPI this admit -FU on H Pylori  Acute blood loss anemia -hgb stable around 9.5  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 -remains on high dose stress steroids - will decrease from q 6 hrs to q 12hrs with eventual plan to resume home decadron -cont Synthroid  Fever -no source ever found -suspect combo MI and GIB as stressor -Ceftriaxone to finish 7 days of abx tx total   Hypokalemia -repleted  -follow labs  DVT prophylaxis: SCDs Code Status: Full Family Communication: Daughter at bedside Disposition Plan/Expected LOS: Transfer to telemetry - begin PT/OT - assess for ability to d/c home   Consultants: Cardiology Gastroenterology PCCM  Procedures: 6/9 Cath lab >>> 100% occluded RCA and 100% occluded s/p DES  6/9 TTE >>> LVH, EF 55%, hypokinesis, grade 1 DD  6/10  Colonoscopy >>> old blood, no active hemorrhage  6/11 EGD >>> reportedly duodenal ulcer, no significant hemorrhage  CULTURES:  6/9 MRSA PCR >>> neg  6/9 Blood >>>  6/10 Urine >>> neg  Antibiotics: Vancomycin 6/9 >>> 6/10  Zosyn 6/9 >>> 6/11  Ceftriaxone 6/11 >>>  Objective: Blood pressure 133/58, pulse 86, temperature 97.8 F (36.6 C), temperature source Oral, resp. rate 16, height 5\' 7"  (1.702 m), weight 234 lb 5.6 oz (106.3 kg), SpO2 99.00%.  Intake/Output Summary (Last 24 hours) at 09/02/13 1440 Last data filed at 09/01/13 2300  Gross per 24 hour  Intake    650 ml  Output      0 ml  Net    650 ml   Exam: General: No acute respiratory distress Lungs: Clear to auscultation bilaterally without wheezes or crackles, RA Cardiovascular: Regular rate and rhythm without murmur gallop or rub normal S1 and S2, no peripheral edema or JVD Abdomen: Nontender, nondistended, soft, bowel sounds positive, no rebound, no ascites, no appreciable mass Musculoskeletal: No significant cyanosis, clubbing of bilateral lower extremities  Scheduled Meds:  Scheduled Meds: . aspirin  81 mg Oral Daily  . atorvastatin  40 mg Oral q1800  . cefTRIAXone (ROCEPHIN)  IV  2 g Intravenous Q24H  . [START ON 09/03/2013] hydrocortisone sodium succinate  50 mg Intravenous Q12H  . insulin aspart  0-20 Units Subcutaneous TID WC  . [START ON 09/03/2013] levothyroxine  175 mcg Oral QAC breakfast  . lisinopril  20 mg Oral Daily  . pantoprazole (PROTONIX) IV  40 mg Intravenous  Q12H  . sucralfate  1 g Oral TID WC & HS  . ticagrelor  90 mg Oral BID   Data Reviewed: Basic Metabolic Panel:  Recent Labs Lab 08/29/13 2300 08/29/13 2316 08/29/13 2326 09/01/13 0402  NA 139  --  142 141  K 3.9  --  3.7 3.6*  CL 97  --  97 102  CO2 24  --   --  22  GLUCOSE 153*  --  154* 149*  BUN 10  --  9 13  CREATININE 0.96  --  1.10 0.54  CALCIUM 9.1  --   --  8.2*  MG  --  2.1  --   --    Liver Function Tests:  Recent  Labs Lab 08/29/13 2300  AST 182*  ALT 40*  ALKPHOS 80  BILITOT 1.1  PROT 7.9  ALBUMIN 3.3*   CBC:  Recent Labs Lab 08/29/13 2300 08/29/13 2326 08/30/13 2120 08/31/13 0315 08/31/13 1125 09/01/13 0402  WBC 14.4*  --  16.5*  --  14.8* 13.2*  NEUTROABS 10.4*  --   --   --   --   --   HGB 13.0 15.6* 9.2* 8.5* 10.6* 9.6*  HCT 40.8 46.0 28.3* 27.0* 32.9* 30.2*  MCV 87.6  --  88.4  --  87.3 87.0  PLT 399  --  296  --  265 288   CBG:  Recent Labs Lab 09/01/13 0829 09/01/13 1124 09/01/13 1708 09/02/13 0810 09/02/13 1246  GLUCAP 133* 97 174* 165* 151*    Recent Results (from the past 240 hour(s))  CULTURE, BLOOD (ROUTINE X 2)     Status: None   Collection Time    08/29/13 11:15 PM      Result Value Ref Range Status   Specimen Description BLOOD LEFT ANTECUBITAL   Final   Special Requests BOTTLES DRAWN AEROBIC AND ANAEROBIC 5CC EA   Final   Culture  Setup Time     Final   Value: 08/30/2013 03:49     Performed at [facility]   Culture     Final   Value:        BLOOD CULTURE RECEIVED NO GROWTH TO DATE CULTURE WILL BE HELD FOR 5 DAYS BEFORE ISSUING A FINAL NEGATIVE REPORT     Performed at [facility]   Report Status PENDING   Incomplete  MRSA PCR SCREENING     Status: None   Collection Time    08/30/13  2:54 AM      Result Value Ref Range Status   MRSA by PCR NEGATIVE  NEGATIVE Final   Comment:            The GeneXpert MRSA Assay (FDA     approved for NASAL specimens     only), is one component of a     comprehensive MRSA colonization     surveillance program. It is not     intended to diagnose MRSA     infection nor to guide or     monitor treatment for     MRSA infections.  CULTURE, BLOOD (ROUTINE X 2)     Status: None   Collection Time    08/30/13  7:00 AM      Result Value Ref Range Status   Specimen Description BLOOD RIGHT HAND   Final   Special Requests BOTTLES DRAWN AEROBIC ONLY 3CC   Final   Culture  Setup Time     Final   Value:  08/30/2013 13:47     Performed at [facility]   Culture     Final   Value:        BLOOD CULTURE RECEIVED NO GROWTH TO DATE CULTURE WILL BE HELD FOR 5 DAYS BEFORE ISSUING A FINAL NEGATIVE REPORT     Performed at [facility]   Report Status PENDING   Incomplete  URINE CULTURE     Status: None   Collection Time    08/31/13 10:25 AM      Result Value Ref Range Status   Specimen Description URINE, CLEAN CATCH   Final   Special Requests NONE   Final   Culture  Setup Time     Final   Value: 08/31/2013 10:48     Performed at [facility]     Final   Value: NO GROWTH     Performed at [facility]   Culture     Final   Value: NO GROWTH     Performed at [facility]   Report Status 09/01/2013 FINAL   Final     Studies:  Recent x-ray studies have been reviewed in detail by the Attending Physician  Erin Hearing, [place] [facility] Office  [phone] Pager [phone]   **If unable to reach the above provider after paging please contact the [place] @ [phone]  On-Call/Text Page:      [email or web address]      password [email or web address]  If 7PM-7AM, please contact night-coverage [email or web address] Password [email or web address] 09/02/2013, 2:40 PM   LOS: 4 days   I have personally examined this patient and reviewed the entire database. I have reviewed the above note, made any necessary editorial changes, and agree with its content.  Cherene Altes, MD [facility]

## 2013-09-03 LAB — BASIC METABOLIC PANEL
BUN: 9 mg/dL (ref 6–23)
CO2: 23 mEq/L (ref 19–32)
Calcium: 8.4 mg/dL (ref 8.4–10.5)
Chloride: 104 mEq/L (ref 96–112)
Creatinine, Ser: 0.55 mg/dL (ref 0.50–1.10)
GFR calc Af Amer: >90 mL/min (ref >90)
GFR calc non Af Amer: >90 mL/min (ref >90)
Glucose, Bld: 135 mg/dL — ABNORMAL HIGH (ref 70–99)
Potassium: 3.2 mEq/L — ABNORMAL LOW (ref 3.7–5.3)
Sodium: 142 mEq/L (ref 137–147)

## 2013-09-03 LAB — GLUCOSE, CAPILLARY: Glucose-Capillary: 144 mg/dL — ABNORMAL HIGH (ref 70–99)

## 2013-09-03 LAB — CBC
HCT: 30.6 % — ABNORMAL LOW (ref 36.0–46.0)
Hemoglobin: 9.7 g/dL — ABNORMAL LOW (ref 12.0–15.0)
MCH: 28 pg (ref 26.0–34.0)
MCHC: 31.7 g/dL (ref 30.0–36.0)
MCV: 88.4 fL (ref 78.0–100.0)
Platelets: 382 10*3/uL (ref 150–400)
RBC: 3.46 MIL/uL — ABNORMAL LOW (ref 3.87–5.11)
RDW: 14.8 % (ref 11.5–15.5)
WBC: 13.1 10*3/uL — ABNORMAL HIGH (ref 4.0–10.5)

## 2013-09-03 MED ORDER — ATORVASTATIN CALCIUM 40 MG PO TABS: 40.0000 mg | ORAL_TABLET | Freq: Every day | ORAL | Status: AC

## 2013-09-03 MED ORDER — LISINOPRIL 20 MG PO TABS: 20.0000 mg | ORAL_TABLET | Freq: Every day | ORAL | Status: DC

## 2013-09-03 MED ORDER — PANTOPRAZOLE SODIUM 40 MG PO TBEC: 40.0000 mg | DELAYED_RELEASE_TABLET | Freq: Two times a day (BID) | ORAL | Status: DC

## 2013-09-03 MED ORDER — CEFTRIAXONE SODIUM 2 G IJ SOLR
2.0000 g | INTRAMUSCULAR | Status: DC
  Administered 2013-09-03: 2 g via INTRAVENOUS
  Filled 2013-09-03: qty 2

## 2013-09-03 MED ORDER — DEXTROSE 5 % IV SOLN
2.0000 g | INTRAVENOUS | Status: DC
  Filled 2013-09-03: qty 2

## 2013-09-03 MED ORDER — SUCRALFATE 1 G PO TABS: 1.0000 g | ORAL_TABLET | Freq: Three times a day (TID) | ORAL | Status: DC

## 2013-09-03 MED ORDER — DEXAMETHASONE 0.5 MG PO TABS: ORAL_TABLET | ORAL | Status: DC

## 2013-09-03 MED ORDER — TICAGRELOR 90 MG PO TABS: 90.0000 mg | ORAL_TABLET | Freq: Two times a day (BID) | ORAL | Status: DC

## 2013-09-03 MED ORDER — POTASSIUM CHLORIDE CRYS ER 20 MEQ PO TBCR
40.0000 meq | EXTENDED_RELEASE_TABLET | Freq: Once | ORAL | Status: AC
  Administered 2013-09-03: 40 meq via ORAL
  Filled 2013-09-03: qty 2

## 2013-09-03 MED ORDER — DEXAMETHASONE 0.5 MG PO TABS
1.0000 mg | ORAL_TABLET | Freq: Two times a day (BID) | ORAL | Status: DC
  Filled 2013-09-03 (×3): qty 2

## 2013-09-03 MED ORDER — ASPIRIN 81 MG PO CHEW: 81.0000 mg | CHEWABLE_TABLET | Freq: Every day | ORAL | Status: DC

## 2013-09-03 NOTE — Care Management Note (Signed)
    Page 1 of 1   09/03/2013     12:42:17 PM CARE MANAGEMENT NOTE 09/03/2013  Patient:  Tammy Good, Tammy Good   Account Number:  [id number]  Date Initiated:  08/30/2013  Documentation initiated by:  Elissa Hefty  Subjective/Objective Assessment:   adm w mi     Action/Plan:   lives alone   Anticipated DC Date:  09/03/2013   Anticipated DC Plan:  [place]  CM consult  Medication Assistance      Choice offered to / List presented to:             Status of service:  Completed, signed off Medicare Important Message given?  NO (If response is "NO", the following Medicare IM given date fields will be blank) Date Medicare IM given:   Date Additional Medicare IM given:    Discharge Disposition:  HOME/SELF CARE  Per UR Regulation:  Reviewed for med. necessity/level of care/duration of stay  If discussed at [place] of Stay Meetings, dates discussed:    Comments:    09-03-13 1238 Jacqlyn Krauss, RN,BSN [phone] Pt plan for d/c today. Pt has brilinta card. CM called [facility] and medications are available. CM faxed meds to [facility] and pt will call to verify price. CM did call the brilinta support program and pt is enrolled in program. Co pay once card given should be 18.00. Original co pay with out card is 64.00 and pt states can not afford this. CM did make pt aware to take like prescribed and not to miss dose. If pt ran out of meds to call cardiologist and ask for any samples available.  6/9 1941 debbie dowell rn,bsn gave pt 30 day free and brilinta copay assist card. pt has no prior auth but 64.00 per month copay. she has copay assist card.

## 2013-09-03 NOTE — Discharge Instructions (Signed)
Heart Attack in Women  Heart attack (myocardial infarction) is one of the leading causes of sudden, unexpected death in women. A heart attack is damage to the heart that is not reversible. A heart attack usually occurs when a heart (coronary) artery becomes narrowed or blocked. The blockage cuts off blood supply to the heart muscle. When one or more of the coronary arteries becomes blocked, that area of the heart begins to die. This can cause pain felt during a heart attack.   If you think you might be having a heart attack, do not ignore your symptoms. Call your local emergency services (911 in [place] immediately. It is recommended that you take a 162 mg non-enteric coated aspirin if you do not have an aspirin allergy. Do not drive yourself to the hospital or wait to see if your symptoms go away. Early recognition of heart attack symptoms is critical. The sooner a heart attack is treated, the greater the amount of heart muscle saved. Time is muscle. It can save your life.  CAUSES   A heart attack can occur from coronary artery disease (CAD). CAD is a process in which the coronary arteries narrow or become blocked from the development of atherosclerosis. Atherosclerosis is a disease in which plaque builds up on the inside of the coronary arteries. Plaque is made up of fats (lipids), cholesterol, calcium, and fibrous tissue. A heart attack can occur due to:  · Plaque buildup that can severely narrow or block the coronary arteries and diminish blood flow.  · Plaque that can become unstable and "rupture." Unstable plaque that ruptures within a coronary artery can form a clot and cause a sudden (acute) blockage of the coronary artery.  · A severe tightening (spasm) of the coronary artery. This is a less common cause of a heart attack. When a coronary artery spasms, it cuts off blood flow through the coronary artery. Spasms can occur in coronary arteries that do not have atherosclerosis.  RISK FACTORS  In women, as the  level of estrogen in the blood decreases after menopause, the risk of a heart attack increases. Other risk factors of heart attack in women include:  · High blood pressure.  · High cholesterol levels.  · Menopause.  · Smoking.  · Obesity.  · Diabetes.  · Hysterectomy.  · Previous heart attack.  · Lack of regular exercise.  · Family history of heart attacks.  SYMPTOMS   In women, heart attack symptoms may be different than those in men. Women may not experience the typical chest discomfort or pain, which is considered the primary heart attack symptom in men. Women may describe a feeling of pressure, ache, or tightness in the chest. Women may experience new or different physical symptoms 1 month or more before a heart attack. Unusual, unexplained fatigue may be the most frequently identified symptom. Sleep disturbances and weakness in the arms may also be considered warning signs.   Other heart attack symptoms that may occur more often in women are:  · Unexplained feelings of nervousness or anxiety.  · Discomfort between the shoulder blades.  · Tingling in the hands and arms.  · Swollen arms.  · Headaches.  Heart attack symptoms for both men and women include:  · Pain or discomfort spreading to the neck, shoulder, arm, or jaw.  · Shortness of breath.  · Sudden cold sweats.  · Pain or discomfort in the abdomen.  · Heartburn or indigestion with or without vomiting.  · Sudden lightheadedness.  ·   limits.  Maintaining a healthy weight.  Staying physically active and exercising regularly.  Decreasing your salt intake.  Eating a diet low in saturated fats and cholesterol.  Increasing your fiber intake by including whole grains, vegetables, and fruits in your diet.  Avoiding situations  that cause stress, anger, or depression.  Taking medicine as advised by your caregiver. SEEK IMMEDIATE MEDICAL CARE IF:   You have severe chest pain, especially if the pain is crushing or pressure-like and spreads to the arms, back, neck, or jaw. This is an emergency. Do not wait to see if the pain will go away. Call your local emergency services (911 in [place] immediately. Do not drive yourself to the hospital.  You develop shortness of breath during rest, sleep, or with activity.  You have sudden, unexplained sweating or clammy skin.  You feel nauseous or vomit without cause.  You become lightheaded or dizzy.  You feel your heart beating rapidly or you notice your heart "skipping" beats. MAKE SURE YOU:   Understand these instructions.  Will watch your condition.  Will get help right away if you are not doing well or get worse. Document Released: 09/06/2007 Document Revised: 09/09/2011 Document Reviewed: 06/12/2011 [facility] Patient Information 2014 [place].  Acute Coronary Syndrome Acute coronary syndrome (ACS) is an urgent problem in which the blood and oxygen supply to the heart is critically deficient. ACS requires hospitalization because one or more coronary arteries may be blocked. ACS represents a range of conditions including:  Previous angina that is now unstable, lasts longer, happens at rest, or is more intense.  A heart attack, with heart muscle cell injury and death. There are three vital coronary arteries that supply the heart muscle with blood and oxygen so that it can pump blood effectively. If blockages to these arteries develop, blood flow to the heart muscle is reduced. If the heart does not get enough blood, angina may occur as the first warning sign. SYMPTOMS   The most common signs of angina include:  Tightness or squeezing in the chest.  Feeling of heaviness on the chest.  Discomfort in the arms, neck, or jaw.  Shortness of breath and  nausea.  Cold, wet skin.  Angina is usually brought on by physical effort or excitement which increase the oxygen needs of the heart. These states increase the blood flow needs of the heart beyond what can be delivered. TREATMENT   Medicines to help discomfort may include nitroglycerin (nitro) in the form of tablets or a spray for rapid relief, or longer-acting forms such as cream, patches, or capsules. (Be aware that there are many side effects and possible interactions with other drugs).  Other medicines may be used to help the heart pump better.  Procedures to open blocked arteries including angioplasty or stent placement to keep the arteries open.  Open heart surgery may be needed when there are many blockages or they are in critical locations that are best treated with surgery. HOME CARE INSTRUCTIONS   Avoid smoking.  Take one baby or adult aspirin daily, if your caregiver advises. This helps reduce the risk of a heart attack.  It is very important that you follow the angina treatment prescribed by your caregiver. Make arrangements for proper follow-up care.  Eat a heart healthy diet with salt and fat restrictions as advised.  Regular exercise is good for you as long as it does not cause discomfort. Do not begin any new type of exercise until you check with your  caregiver.  If you are overweight, you should lose weight.  Try to maintain normal blood lipid levels.  Keep your blood pressure under control as recommended by your caregiver.  You should tell your caregiver right away about any increase in the severity or frequency of your chest discomfort or angina attacks. When you have angina, you should stop what you are doing and sit down. This may bring relief in 3 to 5 minutes. If your caregiver has prescribed nitro, take it as directed.  If your caregiver has given you a follow-up appointment, it is very important to keep that appointment. Not keeping the appointment could  result in a chronic or permanent injury, pain, and disability. If there is any problem keeping the appointment, you must call back to this facility for assistance. SEEK IMMEDIATE MEDICAL CARE IF:   You develop nausea, vomiting, or shortness of breath.  You feel faint, lightheaded, or pass out.  Your chest discomfort gets worse.  You are sweating or experience sudden profound fatigue.  You do not get relief of your chest pain after 3 doses of nitro.  Your discomfort lasts longer than 15 minutes. MAKE SURE YOU:   Understand these instructions.  Will watch your condition.  Will get help right away if you are not doing well or get worse. Document Released: 03/10/2005 Document Revised: 06/02/2011 Document Reviewed: 10/12/2007 [facility] Patient Information 2014 [place].

## 2013-09-03 NOTE — Progress Notes (Signed)
Pt D/C'd home via private vehicle.  Alert and oriented x4.  No c/o pain.  Pt expressed concern over potential prices of medication, so case management was contacted and consulted with pt prior to discharge.  Pt given education on diet, activity, meds, and follow-up care and instructions.  Pt verbalized understanding.  Pt received last Rocephin dose and 40 meq of Potassium prior to D/C.  IV D/C'd.  Tele D/C'd.

## 2013-09-03 NOTE — Progress Notes (Signed)
CARDIAC REHAB PHASE I   PRE:  Rate/Rhythm: 71  BP:  Sitting: 118/50     SaO2: 98% ra  MODE:  Ambulation: 400 ft   POST:  Rate/Rhythm: 85  BP:  Sitting: 138/50     SaO2: 98% RA  9:45AM-10:10AM Patient ambulated independently at a moderate stable pace.  No complaints.  Patient is excited for Cardiac Rehab.    [facility], [place] 09/03/2013 10:09 AM

## 2013-09-03 NOTE — Discharge Summary (Addendum)
DISCHARGE SUMMARY  Tammy Good  MR#: [id number]  DOB:1951/01/07  Date of Admission: 08/29/2013 Date of Discharge: 09/03/2013  Attending Physician:MCCLUNG,JEFFREY T  Patient's PCP: [name], MD   Consults: Cardiology  Gastroenterology  PCCM  Disposition: d/c home   Follow-up Appts:     Follow-up Information   Follow up with Sheela Stack, MD. Schedule an appointment as soon as possible for a visit in 1 week.   Specialty:  Endocrinology   Contact information:   [address] [place] [address] [phone]       Follow up with Leonie Man, MD. Schedule an appointment as soon as possible for a visit in 3 days.   Specialty:  Cardiology   Contact information:   [address] [place] [address] [phone]      Tests Needing Follow-up: -H pylori ab pending at time of d/c  -recheck of K+ is suggested -evaluation of crt is suggested s/p increase in dose of ACE  -consideration should be given to changing protonix dose to QD after 30 days of tx, with ongoing use as long as pt on chronic steroids (unless of course etio proves to be H pylori)  Discharge Diagnoses: ST elevation myocardial infarction of inferior wall  Hypotension  Insulin dependent diabetes mellitus  Acute duodenal ulcers and gastric erosions with bleeding  Acute blood loss anemia  Panhypopituitarism  Fever  Hypokalemia   Initial presentation: 63 yo with DM and hypopituitarism presented with nausea x 3 days and abdominal pain/diarrhea x 1 day. EKG revealed inferior MI, troponin 25. PCCM consulted for hypotension / fever of 104.   Hospital Course:  Procedures 6/9 Cath lab >>> 100% occluded RCA and 100% occluded s/p DES  6/9 TTE >>> LVH, EF 55%, hypokinesis, grade 1 DD  6/10 Colonoscopy >>> old blood, no active hemorrhage  6/11 EGD >>>duodenal ulcers and gastric erosions, no significant hemorrhage   ST elevation myocardial infarction of inferior wall    -cleared for d/c per Cards  -S/p DES on 6/9 to the RCA -Brilinta initiated - CM confirmed pt can obtain  -cont cardiac rehab   Hypotension  -likely cardiogenic and adrenal insuff, doubt septic  -resolved   Insulin dependent diabetes mellitus  -CBGs controlled  -cont SSI  -was on Lantus 42 units with Invokana 300 mg and SSI at home  -suspect on less strict diet at home therefore better glycemic control IP- also pt endorsed poor intake due to not able to eat foods she likes while IP  -A1c 6.9 - return to usual regimen upon d/c home   Acute duodenal ulcers and gastric erosions with bleeding  -diagnosed via EGD this admit  -started on Carafate and PPI  -H Pylori IgG antibody pending at time of d/c   Acute blood loss anemia  -hgb stable around 9.5 - 9.7  Panhypopituitarism  -on chronic decadron preadmit  -stress dose steroids utilized during hospital stay- begin wean to home steroid dosing at time of d/c  Fever  -no source ever found  -suspect combo MI and GIB as stressor  -completed 7 days of abx tx w/ Ceftriaxone  Hypokalemia  -repleted with extra 30meq dose at d/c     Medication List    STOP taking these medications       ibuprofen 200 MG tablet  Commonly known as:  ADVIL,MOTRIN     loperamide 2 MG capsule  Commonly known as:  IMODIUM      TAKE these medications  aspirin 81 MG chewable tablet  Chew 1 tablet (81 mg total) by mouth daily.     atorvastatin 40 MG tablet  Commonly known as:  LIPITOR  Take 1 tablet (40 mg total) by mouth daily at 6 PM.     dexamethasone 0.5 MG tablet  Commonly known as:  DECADRON  Take 2 tablets 2 times a day for 3 days, then take one tablet 2 times a day for 3 days, then go back to taking one tablet a day as you previously did     insulin glargine 100 UNIT/ML injection  Commonly known as:  LANTUS  Inject 42 Units into the skin at bedtime.     insulin lispro 100 UNIT/ML injection  Commonly known as:  HUMALOG  Inject  4-10 Units into the skin See admin instructions. Sliding scale     INVOKANA 300 MG Tabs  Generic drug:  Canagliflozin  Take 300 mg by mouth daily.     levothyroxine 175 MCG tablet  Commonly known as:  SYNTHROID, LEVOTHROID  Take 175 mcg by mouth daily before breakfast.     lisinopril 20 MG tablet  Commonly known as:  PRINIVIL,ZESTRIL  Take 1 tablet (20 mg total) by mouth daily.     pantoprazole 40 MG tablet  Commonly known as:  PROTONIX  Take 1 tablet (40 mg total) by mouth 2 (two) times daily before a meal.     sucralfate 1 G tablet  Commonly known as:  CARAFATE  Take 1 tablet (1 g total) by mouth 4 (four) times daily -  with meals and at bedtime.     ticagrelor 90 MG Tabs tablet  Commonly known as:  BRILINTA  Take 1 tablet (90 mg total) by mouth 2 (two) times daily.       Day of Discharge BP 121/75  Pulse 64  Temp(Src) 99 F (37.2 C) (Oral)  Resp 17  Ht 5\' 7"  (1.702 m)  Wt 106.3 kg (234 lb 5.6 oz)  BMI 36.70 kg/m2  SpO2 98%  Physical Exam: General: No acute respiratory distress Lungs: Clear to auscultation bilaterally without wheezes or crackles Cardiovascular: Regular rate and rhythm without murmur gallop or rub normal S1 and S2 Abdomen: Nontender, nondistended, soft, bowel sounds positive, no rebound, no ascites, no appreciable mass Extremities: No significant cyanosis, clubbing, or edema bilateral lower extremities  Results for orders placed during the hospital encounter of 08/29/13 (from the past 24 hour(s))  GLUCOSE, CAPILLARY     Status: Abnormal   Collection Time    09/02/13 12:46 PM      Result Value Ref Range   Glucose-Capillary 151 (*) 70 - 99 mg/dL  HEMOGLOBIN A1C     Status: Abnormal   Collection Time    09/02/13  3:50 PM      Result Value Ref Range   Hemoglobin A1C 6.9 (*) <5.7 %   Mean Plasma Glucose 151 (*) <117 mg/dL  GLUCOSE, CAPILLARY     Status: Abnormal   Collection Time    09/02/13  4:20 PM      Result Value Ref Range    Glucose-Capillary 163 (*) 70 - 99 mg/dL   Comment 1 Notify RN    GLUCOSE, CAPILLARY     Status: Abnormal   Collection Time    09/02/13  8:41 PM      Result Value Ref Range   Glucose-Capillary 182 (*) 70 - 99 mg/dL   Comment 1 Documented in Chart     Comment 2  Notify RN    BASIC METABOLIC PANEL     Status: Abnormal   Collection Time    09/03/13  4:05 AM      Result Value Ref Range   Sodium 142  137 - 147 mEq/L   Potassium 3.2 (*) 3.7 - 5.3 mEq/L   Chloride 104  96 - 112 mEq/L   CO2 23  19 - 32 mEq/L   Glucose, Bld 135 (*) 70 - 99 mg/dL   BUN 9  6 - 23 mg/dL   Creatinine, Ser 0.55  0.50 - 1.10 mg/dL   Calcium 8.4  8.4 - 10.5 mg/dL   GFR calc non Af Amer >90  >90 mL/min   GFR calc Af Amer >90  >90 mL/min  CBC     Status: Abnormal   Collection Time    09/03/13  4:05 AM      Result Value Ref Range   WBC 13.1 (*) 4.0 - 10.5 K/uL   RBC 3.46 (*) 3.87 - 5.11 MIL/uL   Hemoglobin 9.7 (*) 12.0 - 15.0 g/dL   HCT 30.6 (*) 36.0 - 46.0 %   MCV 88.4  78.0 - 100.0 fL   MCH 28.0  26.0 - 34.0 pg   MCHC 31.7  30.0 - 36.0 g/dL   RDW 14.8  11.5 - 15.5 %   Platelets 382  150 - 400 K/uL  GLUCOSE, CAPILLARY     Status: Abnormal   Collection Time    09/03/13  6:05 AM      Result Value Ref Range   Glucose-Capillary 144 (*) 70 - 99 mg/dL   Comment 1 Documented in Chart     Comment 2 Notify RN      Time spent in discharge (includes decision making & examination of pt): >30 minutes  09/03/2013, 9:48 AM   Cherene Altes, MD [facility] Office  [phone] Pager [phone]  On-Call/Text Page:      [email or web address]      password [facility]

## 2013-09-04 LAB — TYPE AND SCREEN
ABO/RH(D): O POS
Antibody Screen: NEGATIVE
Unit division: 0
Unit division: 0
Unit division: 0
Unit division: 0

## 2013-09-05 LAB — CULTURE, BLOOD (ROUTINE X 2)
Culture: NO GROWTH
Culture: NO GROWTH

## 2013-09-05 LAB — H. PYLORI ANTIBODY, IGG: H Pylori IgG: <0.4 {ISR}

## 2013-09-08 DIAGNOSIS — Z955 Presence of coronary angioplasty implant and graft: Secondary | ICD-10-CM | POA: Insufficient documentation

## 2013-09-12 ENCOUNTER — Telehealth: Payer: Self-pay | Admitting: *Deleted

## 2013-09-12 NOTE — Telephone Encounter (Signed)
Faxed order for phase 2 cardiac rehab  

## 2013-09-16 ENCOUNTER — Telehealth: Payer: Self-pay | Admitting: Cardiology

## 2013-09-16 ENCOUNTER — Encounter: Payer: Self-pay | Admitting: Cardiology

## 2013-09-16 ENCOUNTER — Ambulatory Visit (INDEPENDENT_AMBULATORY_CARE_PROVIDER_SITE_OTHER): Payer: BC Managed Care – PPO | Admitting: Cardiology

## 2013-09-16 VITALS — BP 122/70 | HR 77 | Ht 67.0 in | Wt 224.0 lb

## 2013-09-16 DIAGNOSIS — I2111 ST elevation (STEMI) myocardial infarction involving right coronary artery: Secondary | ICD-10-CM

## 2013-09-16 DIAGNOSIS — Z955 Presence of coronary angioplasty implant and graft: Secondary | ICD-10-CM | POA: Insufficient documentation

## 2013-09-16 DIAGNOSIS — I2119 ST elevation (STEMI) myocardial infarction involving other coronary artery of inferior wall: Secondary | ICD-10-CM

## 2013-09-16 DIAGNOSIS — E119 Type 2 diabetes mellitus without complications: Secondary | ICD-10-CM

## 2013-09-16 DIAGNOSIS — E669 Obesity, unspecified: Secondary | ICD-10-CM

## 2013-09-16 DIAGNOSIS — Z794 Long term (current) use of insulin: Secondary | ICD-10-CM

## 2013-09-16 DIAGNOSIS — R7989 Other specified abnormal findings of blood chemistry: Secondary | ICD-10-CM

## 2013-09-16 DIAGNOSIS — E230 Hypopituitarism: Secondary | ICD-10-CM

## 2013-09-16 DIAGNOSIS — E785 Hyperlipidemia, unspecified: Secondary | ICD-10-CM

## 2013-09-16 DIAGNOSIS — D62 Acute posthemorrhagic anemia: Secondary | ICD-10-CM

## 2013-09-16 DIAGNOSIS — Z9861 Coronary angioplasty status: Secondary | ICD-10-CM

## 2013-09-16 DIAGNOSIS — IMO0001 Reserved for inherently not codable concepts without codable children: Secondary | ICD-10-CM

## 2013-09-16 DIAGNOSIS — E1169 Type 2 diabetes mellitus with other specified complication: Secondary | ICD-10-CM

## 2013-09-16 DIAGNOSIS — R945 Abnormal results of liver function studies: Secondary | ICD-10-CM | POA: Insufficient documentation

## 2013-09-16 HISTORY — DX: Presence of coronary angioplasty implant and graft: Z95.5

## 2013-09-16 LAB — CBC
HCT: 34.1 % — ABNORMAL LOW (ref 36.0–46.0)
Hemoglobin: 11.3 g/dL — ABNORMAL LOW (ref 12.0–15.0)
MCH: 28.2 pg (ref 26.0–34.0)
MCHC: 33.1 g/dL (ref 30.0–36.0)
MCV: 85 fL (ref 78.0–100.0)
Platelets: 677 10*3/uL — ABNORMAL HIGH (ref 150–400)
RBC: 4.01 MIL/uL (ref 3.87–5.11)
RDW: 16 % — ABNORMAL HIGH (ref 11.5–15.5)
WBC: 10.6 10*3/uL — ABNORMAL HIGH (ref 4.0–10.5)

## 2013-09-16 MED ORDER — METOPROLOL TARTRATE 25 MG PO TABS: 25.0000 mg | ORAL_TABLET | Freq: Two times a day (BID) | ORAL | Status: DC

## 2013-09-16 NOTE — Assessment & Plan Note (Signed)
See above

## 2013-09-16 NOTE — Telephone Encounter (Signed)
Spoke to [place] -- new requisition for labs were sent to [facility] --CMP , CBC.

## 2013-09-16 NOTE — Assessment & Plan Note (Signed)
Followed by Dr. Forde Dandy but she tells me her glucose is stable

## 2013-09-16 NOTE — Assessment & Plan Note (Signed)
She is on increased steroid dosing during hospitalization but now back on her usual daily dosing

## 2013-09-16 NOTE — Progress Notes (Signed)
09/18/2013   PCP: No primary provider on file.   Chief Complaint  Patient presents with  . Follow-up    S/P hospital visit   S/P anterior wall mi with stent to the RCA    Primary Cardiologist:Dr. Roni Bread  HPI:  63 year old female presents today for followup status post complex hospitalization including out of hospital ST elevation MI with initial troponin of 25. She also had fever on admission and was treated with antibiotics before the Cath Lab.  She did undergo a stent placed to the RCA for 100% occluded vessel and angioplasty to the Rt  PLA branch. She is on Brilinta for this and aspirin.  Other complications or diarrhea and GI bleeding with acute duodenal ulcers and gastric erosions diagnosed with EGD her hemoglobin was stable at 9.5. She also had panhypopituitarism and she's on chronic Decadron but she was increased to stress doses.  The fever was a combination of MI and GI bleed as well as her panhypopituitarism.  Additionally she had cardiogenic shock and was on norepinephrine. Because of this she was not on beta blocker discharge.  Today she is back for followup and actually feels quite well her memory of the events is pretty poor she does not remember being as sick as she was she thought she had diarrhea nausea and vomiting and somehow had a heart attack.  She has no chest pain or shortness of breath she does relate she's not had a bowel movement in 10 days she was told to take MiraLax but she only took it once not knowing she should take it daily at least until she has a bowel movement.  She will begin taking daily.  No Known Allergies  Current Outpatient Prescriptions  Medication Sig Dispense Refill  . aspirin 81 MG chewable tablet Chew 1 tablet (81 mg total) by mouth daily.      [name] atorvastatin (LIPITOR) 40 MG tablet Take 1 tablet (40 mg total) by mouth daily at 6 PM.  30 tablet  0  . dexamethasone (DECADRON) 0.5 MG tablet Take 0.5 mg by mouth daily. Take 2  tablets 2 times a day for 3 days, then take one tablet 2 times a day for 3 days, then go back to taking one tablet a day as you previously did      . insulin glargine (LANTUS) 100 UNIT/ML injection Inject 42 Units into the skin at bedtime.        . insulin lispro (HUMALOG) 100 UNIT/ML injection Inject 4-10 Units into the skin See admin instructions. Sliding scale      . INVOKANA 300 MG TABS Take 300 mg by mouth daily.       [name] levothyroxine (SYNTHROID, LEVOTHROID) 175 MCG tablet Take 175 mcg by mouth daily before breakfast.      . lisinopril (PRINIVIL,ZESTRIL) 20 MG tablet Take 1 tablet (20 mg total) by mouth daily.  30 tablet  0  . pantoprazole (PROTONIX) 40 MG tablet Take 1 tablet (40 mg total) by mouth 2 (two) times daily before a meal.  60 tablet  0  . sucralfate (CARAFATE) 1 G tablet Take 1 tablet (1 g total) by mouth 4 (four) times daily -  with meals and at bedtime.  120 tablet  0  . ticagrelor (BRILINTA) 90 MG TABS tablet Take 1 tablet (90 mg total) by mouth 2 (two) times daily.  60 tablet  0  . metoprolol tartrate (LOPRESSOR) 25 MG tablet  Take 1 tablet (25 mg total) by mouth 2 (two) times daily.  60 tablet  6   No current facility-administered medications for this visit.    Past Medical History  Diagnosis Date  . Diabetes mellitus   . Thyroid disease   . Hypopituitarism   . CAD (coronary artery disease) 08/2013    STEMI, 100% occl RCA s/p DES stent. Out of Hospital  . LV dysfunction 08/2013    mild EF 50-55% after MI  . Sinus tachycardia     with exertion BB started  . Status post insertion of drug eluting coronary artery stent to RCA emergently 09/16/2013  . Acute blood loss anemia 09/16/2013  . Abnormal LFTs 09/16/2013  . ST elevation (STEMI) myocardial infarction 08/29/2013    Past Surgical History  Procedure Laterality Date  . Pituitary surgery    . Colonoscopy N/A 08/31/2013    Procedure: COLONOSCOPY;  Surgeon: Juanita Craver, MD;  Location: [facility] ENDOSCOPY;  Service: Endoscopy;   Laterality: N/A;  . Esophagogastroduodenoscopy N/A 09/01/2013    Procedure: ESOPHAGOGASTRODUODENOSCOPY (EGD);  Surgeon: Beryle Beams, MD;  Location: [facility] ENDOSCOPY;  Service: Endoscopy;  Laterality: N/A;  bedside  . Coronary angioplasty with stent placement  08/29/2013    WUJ:WJXBJYN:WG colds or fevers, no weight changes Skin:no rashes or ulcers HEENT:no blurred vision, no congestion CV:see HPI PUL:see HPI GI:no diarrhea +++constipation- no BM for 10 days but has only had miralx once, no melena, no indigestion GU:no hematuria, no dysuria MS:no joint pain, no claudication, tingling pains on Rt thigh, and lump lateral thigh Neuro:no syncope, no lightheadedness Endo:+ diabetes she stated her glucose was staying good, no thyroid disease  Wt Readings from Last 3 Encounters:  09/16/13 224 lb (101.606 kg)  08/30/13 234 lb 5.6 oz (106.3 kg)  08/30/13 234 lb 5.6 oz (106.3 kg)    PHYSICAL EXAM BP 122/70  Pulse 77  Ht 5\' 7"  (1.702 m)  Wt 224 lb (101.606 kg)  BMI 35.08 kg/m2 General:Pleasant affect, NAD Skin:Warm and dry, brisk capillary refill HEENT:normocephalic, sclera clear, mucus membranes moist Neck:supple, no JVD, no bruits  Heart:S1S2 RRR with 1/6 systolic murmur, no gallup, rub or click Lungs:clear without rales, rhonchi, or wheezes NFA:OZHYQ,MVHQ, non tender, + BS, do not palpate liver spleen or masses Ext:no lower ext edema, 2+ pedal pulses, 2+ radial pulses Neuro:alert and oriented, MAE, follows commands, + facial symmetry  EKG:SR rate of 77 with evolutionary changes of inf MI with deep T wave inversions inf lat.  Leads.  ASSESSMENT AND PLAN STEMI (ST elevation myocardial infarction) 08/30/13 of inf wall Patient percent with inferior ST elevation MI after several days of nausea vomiting and diarrhea then developed increasing abdominal pain. She also had fever on admission went to the Cath Lab and received stent to the RCA the mid RCA there was 100% occluded with a resolute  drug-eluting stent and then underwent angioplasty of the proximal right PLA.  No chest pain though she never had chest pain.  No shortness of breath. She does complain of when she's trying to do her 5 minute walk in the house she can barely make it to the end of the 5 minutes she so tired today with walking with pulse oximetery her oxygen sat was normal but her heart rate did come up to 102 we'll add beta blocker to her medications-Lopressor 25 mg twice a day. Was not given in the hospital secondary to hypotension, shock initially.  Status post insertion of drug eluting coronary artery stent to  mRCA emergently and PTCA to prox. PLA See above  Hyperlipidemia LDL goal <70 On Lipitor 40 daily we will recheck hepatic panel as her LFTs were abnormally elevated on admission.  Abnormal LFTs See above  Acute blood loss anemia Been doing EGD and colonoscopy during hospitalization did have an ulcer but no active bleeding. We will recheck CBC today.  IDDM (insulin dependent diabetes mellitus) Followed by Dr. Forde Dandy but she tells me her glucose is stable  Panhypopituitarism She is on increased steroid dosing during hospitalization but now back on her usual daily dosing    She will follow with Dr. Ellyn Hack in 4-6 weeks

## 2013-09-16 NOTE — Patient Instructions (Addendum)
I added Lopressor (metoprolol) to your meds to help control your heart rate.  No driving for 1 more week.  Heart Healthy diabetic diet.  Follow up with Dr. Ellyn Hack in 4 weeks  Call if any weakness, shortness of breath or any chest pain.  Or diarrhea abd pain.  Take miralax every day for bowel movement, may add senokot over the counter in addition if not BM

## 2013-09-16 NOTE — Assessment & Plan Note (Signed)
Patient percent with inferior ST elevation MI after several days of nausea vomiting and diarrhea then developed increasing abdominal pain. She also had fever on admission went to the Cath Lab and received stent to the RCA the mid RCA there was 100% occluded with a resolute drug-eluting stent and then underwent angioplasty of the proximal right PLA.  No chest pain though she never had chest pain.  No shortness of breath. She does complain of when she's trying to do her 5 minute walk in the house she can barely make it to the end of the 5 minutes she so tired today with walking with pulse oximetery her oxygen sat was normal but her heart rate did come up to 102 we'll add beta blocker to her medications-Lopressor 25 mg twice a day. Was not given in the hospital secondary to hypotension, shock initially.

## 2013-09-16 NOTE — Assessment & Plan Note (Signed)
Been doing EGD and colonoscopy during hospitalization did have an ulcer but no active bleeding. We will recheck CBC today.

## 2013-09-16 NOTE — Telephone Encounter (Signed)
SEE PREVIOUS NOTE

## 2013-09-16 NOTE — Assessment & Plan Note (Signed)
On Lipitor 40 daily we will recheck hepatic panel as her LFTs were abnormally elevated on admission.

## 2013-09-16 NOTE — Telephone Encounter (Signed)
Has a question about the req. Please call    Thanks

## 2013-09-17 LAB — COMPREHENSIVE METABOLIC PANEL
ALT: 17 U/L (ref 0–35)
AST: 13 U/L (ref 0–37)
Albumin: 3.8 g/dL (ref 3.5–5.2)
Alkaline Phosphatase: 68 U/L (ref 39–117)
BUN: 13 mg/dL (ref 6–23)
CO2: 26 mEq/L (ref 19–32)
Calcium: 9.8 mg/dL (ref 8.4–10.5)
Chloride: 106 mEq/L (ref 96–112)
Creat: 0.74 mg/dL (ref 0.50–1.10)
Glucose, Bld: 129 mg/dL — ABNORMAL HIGH (ref 70–99)
Potassium: 4.2 mEq/L (ref 3.5–5.3)
Sodium: 139 mEq/L (ref 135–145)
Total Bilirubin: 0.7 mg/dL (ref 0.2–1.2)
Total Protein: 7.2 g/dL (ref 6.0–8.3)

## 2013-09-18 ENCOUNTER — Encounter: Payer: Self-pay | Admitting: Cardiology

## 2013-09-27 ENCOUNTER — Telehealth: Payer: Self-pay | Admitting: Cardiology

## 2013-09-27 NOTE — Telephone Encounter (Signed)
SPOKE TO PATIENT . PATIENT STATES THAT HER PRIMARY -DR SOUTH'S OFFICE WILL TAKE CARE OF THE REFILL

## 2013-09-27 NOTE — Telephone Encounter (Signed)
Patient is confused regarding her medications---does not know which ones to refill and which ones to stop taking.  Please call.

## 2013-10-06 ENCOUNTER — Inpatient Hospital Stay (HOSPITAL_COMMUNITY)
Admission: RE | Admit: 2013-10-06 | Discharge: 2013-10-06 | Disposition: A | Discharge status: Home / Self Care | Payer: BC Managed Care – PPO | Source: Ambulatory Visit

## 2013-10-06 NOTE — Progress Notes (Signed)
Cardiac Rehab Medication Review by a Pharmacist  Does the patient  feel that his/her medications are working for him/her?  yes  Has the patient been experiencing any side effects to the medications prescribed?  yes  Does the patient measure his/her own blood pressure or blood glucose at home?  yes   Does the patient have any problems obtaining medications due to transportation or finances?   no  Understanding of regimen: excellent Understanding of indications: good Potential of compliance: excellent  Pharmacist comments: Patient has experienced dizziness and lightheadedness seldomly.  Checks bs tid.   Tammy Good 10/06/2013 8:16 AM

## 2013-10-10 ENCOUNTER — Telehealth: Payer: Self-pay | Admitting: *Deleted

## 2013-10-10 ENCOUNTER — Encounter (HOSPITAL_COMMUNITY): Payer: Self-pay

## 2013-10-10 ENCOUNTER — Encounter (HOSPITAL_COMMUNITY)
Admission: RE | Admit: 2013-10-10 | Discharge: 2013-10-10 | Disposition: A | Discharge status: Home / Self Care | Payer: BC Managed Care – PPO | Source: Ambulatory Visit | Attending: Internal Medicine | Admitting: Internal Medicine

## 2013-10-10 ENCOUNTER — Telehealth: Payer: Self-pay | Admitting: Internal Medicine

## 2013-10-10 DIAGNOSIS — I2510 Atherosclerotic heart disease of native coronary artery without angina pectoris: Secondary | ICD-10-CM | POA: Insufficient documentation

## 2013-10-10 DIAGNOSIS — I4949 Other premature depolarization: Secondary | ICD-10-CM | POA: Diagnosis present

## 2013-10-10 DIAGNOSIS — Z9861 Coronary angioplasty status: Secondary | ICD-10-CM | POA: Insufficient documentation

## 2013-10-10 DIAGNOSIS — Z5189 Encounter for other specified aftercare: Secondary | ICD-10-CM | POA: Insufficient documentation

## 2013-10-10 DIAGNOSIS — I2119 ST elevation (STEMI) myocardial infarction involving other coronary artery of inferior wall: Secondary | ICD-10-CM | POA: Diagnosis not present

## 2013-10-10 LAB — GLUCOSE, CAPILLARY
Glucose-Capillary: 184 mg/dL — ABNORMAL HIGH (ref 70–99)
Glucose-Capillary: 150 mg/dL — ABNORMAL HIGH (ref 70–99)

## 2013-10-10 NOTE — Telephone Encounter (Signed)
Spoke with Tammy Good, pt is having freq PVC's at rehab. She has faxed the strips over for review. There is nothing noted in the pts chart. Strips are here for review.

## 2013-10-10 NOTE — Progress Notes (Addendum)
Pt started cardiac rehab today.  Pt tolerated light exercise without difficulty.  VSS, telemetry-sinus rhythm, neg QRS,  T wave inversion with frequent ventricular ectopy, multifocal PVC with couplets and begiminal PVC at rest.  Strips faxed for Dr. Lysbeth Penner review.  Pt asymptomatic.  PHQ-0.  Pt is joyful, with positive outlook and good coping skills.  Pt enjoys playing cards, reading and baking for others. Pt cardiac rehab goals are to establish exercise habit and lose weight.  Pt oriented to exercise equipment and routine.  Understanding verbalized.

## 2013-10-11 NOTE — Telephone Encounter (Signed)
Strips on Dr. Lysbeth Penner cart for review.

## 2013-10-12 ENCOUNTER — Encounter (HOSPITAL_COMMUNITY)
Admission: RE | Admit: 2013-10-12 | Discharge: 2013-10-12 | Disposition: A | Discharge status: Home / Self Care | Payer: BC Managed Care – PPO | Source: Ambulatory Visit | Attending: Internal Medicine | Admitting: Internal Medicine

## 2013-10-12 DIAGNOSIS — Z5189 Encounter for other specified aftercare: Secondary | ICD-10-CM | POA: Diagnosis not present

## 2013-10-12 LAB — GLUCOSE, CAPILLARY
Glucose-Capillary: 142 mg/dL — ABNORMAL HIGH (ref 70–99)
Glucose-Capillary: 133 mg/dL — ABNORMAL HIGH (ref 70–99)
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
## 2013-10-14 ENCOUNTER — Encounter (HOSPITAL_COMMUNITY)
Admission: RE | Admit: 2013-10-14 | Discharge: 2013-10-14 | Disposition: A | Discharge status: Home / Self Care | Payer: BC Managed Care – PPO | Source: Ambulatory Visit | Attending: Internal Medicine | Admitting: Internal Medicine

## 2013-10-14 DIAGNOSIS — Z5189 Encounter for other specified aftercare: Secondary | ICD-10-CM | POA: Diagnosis not present

## 2013-10-14 LAB — GLUCOSE, CAPILLARY
Glucose-Capillary: 171 mg/dL — ABNORMAL HIGH (ref 70–99)
Glucose-Capillary: 161 mg/dL — ABNORMAL HIGH (ref 70–99)

## 2013-10-17 ENCOUNTER — Ambulatory Visit: Payer: BC Managed Care – PPO | Admitting: Cardiology

## 2013-10-17 ENCOUNTER — Encounter (HOSPITAL_COMMUNITY)
Admission: RE | Admit: 2013-10-17 | Discharge: 2013-10-17 | Disposition: A | Discharge status: Home / Self Care | Payer: BC Managed Care – PPO | Source: Ambulatory Visit | Attending: Internal Medicine | Admitting: Internal Medicine

## 2013-10-17 DIAGNOSIS — Z5189 Encounter for other specified aftercare: Secondary | ICD-10-CM | POA: Diagnosis not present

## 2013-10-17 LAB — GLUCOSE, CAPILLARY: Glucose-Capillary: 190 mg/dL — ABNORMAL HIGH (ref 70–99)

## 2013-10-19 ENCOUNTER — Encounter (HOSPITAL_COMMUNITY)
Admission: RE | Admit: 2013-10-19 | Discharge: 2013-10-19 | Disposition: A | Discharge status: Home / Self Care | Payer: BC Managed Care – PPO | Source: Ambulatory Visit | Attending: Internal Medicine | Admitting: Internal Medicine

## 2013-10-19 DIAGNOSIS — Z5189 Encounter for other specified aftercare: Secondary | ICD-10-CM | POA: Diagnosis not present

## 2013-10-19 LAB — GLUCOSE, CAPILLARY: Glucose-Capillary: 162 mg/dL — ABNORMAL HIGH (ref 70–99)

## 2013-10-19 NOTE — Progress Notes (Signed)
I have reviewed home exercise with [place]. The patient was advised to walk 2-4 days per week outside of CRP [place] for 10 minutes, 3 times per day, progressing to 15 minutes, 2 times per day until she can walk 30 minutes continuously.  Pt will also complete one additional day of hand weights outside of CRP II.  Progression of exercise prescription was discussed.  Reviewed THR, pulse, RPE, sign and symptoms, NTG use and when to call 911 or MD.  Pt voiced understanding.  Archie Endo, MS, ACSM RCEP 10/19/2013 9:18 AM

## 2013-10-21 ENCOUNTER — Encounter (HOSPITAL_COMMUNITY): Payer: BC Managed Care – PPO

## 2013-10-24 ENCOUNTER — Encounter (HOSPITAL_COMMUNITY)
Admission: RE | Admit: 2013-10-24 | Discharge: 2013-10-24 | Disposition: A | Discharge status: Home / Self Care | Payer: BC Managed Care – PPO | Source: Ambulatory Visit | Attending: Internal Medicine | Admitting: Internal Medicine

## 2013-10-24 DIAGNOSIS — I219 Acute myocardial infarction, unspecified: Secondary | ICD-10-CM | POA: Diagnosis present

## 2013-10-24 DIAGNOSIS — Z9861 Coronary angioplasty status: Secondary | ICD-10-CM | POA: Insufficient documentation

## 2013-10-25 LAB — GLUCOSE, CAPILLARY: Glucose-Capillary: 190 mg/dL — ABNORMAL HIGH (ref 70–99)

## 2013-10-26 ENCOUNTER — Encounter (HOSPITAL_COMMUNITY)
Admission: RE | Admit: 2013-10-26 | Discharge: 2013-10-26 | Disposition: A | Discharge status: Home / Self Care | Payer: BC Managed Care – PPO | Source: Ambulatory Visit | Attending: Internal Medicine | Admitting: Internal Medicine

## 2013-10-26 DIAGNOSIS — I219 Acute myocardial infarction, unspecified: Secondary | ICD-10-CM | POA: Diagnosis not present

## 2013-10-26 LAB — GLUCOSE, CAPILLARY: Glucose-Capillary: 166 mg/dL — ABNORMAL HIGH (ref 70–99)

## 2013-10-28 ENCOUNTER — Encounter (HOSPITAL_COMMUNITY)
Admission: RE | Admit: 2013-10-28 | Discharge: 2013-10-28 | Disposition: A | Discharge status: Home / Self Care | Payer: BC Managed Care – PPO | Source: Ambulatory Visit | Attending: Internal Medicine | Admitting: Internal Medicine

## 2013-10-28 DIAGNOSIS — I219 Acute myocardial infarction, unspecified: Secondary | ICD-10-CM | POA: Diagnosis not present

## 2013-10-31 ENCOUNTER — Ambulatory Visit (INDEPENDENT_AMBULATORY_CARE_PROVIDER_SITE_OTHER): Payer: BC Managed Care – PPO | Admitting: Cardiology

## 2013-10-31 ENCOUNTER — Encounter: Payer: Self-pay | Admitting: Cardiology

## 2013-10-31 ENCOUNTER — Ambulatory Visit (HOSPITAL_COMMUNITY)
Admission: RE | Admit: 2013-10-31 | Discharge: 2013-10-31 | Disposition: A | Discharge status: Home / Self Care | Payer: BC Managed Care – PPO | Source: Ambulatory Visit | Attending: Cardiology | Admitting: Cardiology

## 2013-10-31 ENCOUNTER — Encounter (HOSPITAL_COMMUNITY)
Admission: RE | Admit: 2013-10-31 | Discharge: 2013-10-31 | Disposition: A | Discharge status: Home / Self Care | Payer: BC Managed Care – PPO | Source: Ambulatory Visit | Attending: Internal Medicine | Admitting: Internal Medicine

## 2013-10-31 VITALS — BP 110/60 | HR 64 | Ht 67.0 in | Wt 233.1 lb

## 2013-10-31 DIAGNOSIS — I739 Peripheral vascular disease, unspecified: Secondary | ICD-10-CM

## 2013-10-31 DIAGNOSIS — E119 Type 2 diabetes mellitus without complications: Secondary | ICD-10-CM

## 2013-10-31 DIAGNOSIS — R42 Dizziness and giddiness: Secondary | ICD-10-CM

## 2013-10-31 DIAGNOSIS — Z955 Presence of coronary angioplasty implant and graft: Secondary | ICD-10-CM

## 2013-10-31 DIAGNOSIS — M79609 Pain in unspecified limb: Secondary | ICD-10-CM | POA: Diagnosis present

## 2013-10-31 DIAGNOSIS — Z9861 Coronary angioplasty status: Secondary | ICD-10-CM | POA: Insufficient documentation

## 2013-10-31 DIAGNOSIS — IMO0001 Reserved for inherently not codable concepts without codable children: Secondary | ICD-10-CM

## 2013-10-31 DIAGNOSIS — E785 Hyperlipidemia, unspecified: Secondary | ICD-10-CM

## 2013-10-31 DIAGNOSIS — Z5189 Encounter for other specified aftercare: Secondary | ICD-10-CM | POA: Diagnosis not present

## 2013-10-31 DIAGNOSIS — Z794 Long term (current) use of insulin: Secondary | ICD-10-CM

## 2013-10-31 DIAGNOSIS — I2510 Atherosclerotic heart disease of native coronary artery without angina pectoris: Secondary | ICD-10-CM

## 2013-10-31 DIAGNOSIS — M79651 Pain in right thigh: Secondary | ICD-10-CM

## 2013-10-31 DIAGNOSIS — I4949 Other premature depolarization: Secondary | ICD-10-CM

## 2013-10-31 DIAGNOSIS — D62 Acute posthemorrhagic anemia: Secondary | ICD-10-CM

## 2013-10-31 DIAGNOSIS — E230 Hypopituitarism: Secondary | ICD-10-CM

## 2013-10-31 DIAGNOSIS — I252 Old myocardial infarction: Secondary | ICD-10-CM | POA: Insufficient documentation

## 2013-10-31 DIAGNOSIS — I493 Ventricular premature depolarization: Secondary | ICD-10-CM

## 2013-10-31 DIAGNOSIS — I2109 ST elevation (STEMI) myocardial infarction involving other coronary artery of anterior wall: Secondary | ICD-10-CM

## 2013-10-31 NOTE — Progress Notes (Addendum)
11/01/2013   PCP: Sheela Stack, MD   Chief Complaint  Patient presents with  . Dizziness    F/U c/o pain side of rt thigh,feells warm to touch,dizzy spells    Primary Cardiologist:Dr. Roni Bread   HPI:  63 year old female presents today for followup status post complex hospitalization including out of hospital ST elevation MI with initial troponin of 25. She also had fever on admission and was treated with antibiotics before the Cath Lab. She did undergo a stent placed to the RCA for 100% occluded vessel and angioplasty to the Rt PLA branch. She is on Brilinta for this and aspirin. I saw her last 09/18/13.  Please see that note.  Today she is complaining of dizziness when she gets up and at other times with movement.  Her orthostatic BP check were normal.  She also has Rt groin and thigh pain, with knot.    Denies chest pain, no SOB.  She is progressing well without complications, except as above.  She also would like to return to driving and exercising. It is also time for follow up lipids.Repeat labs at last visit with improved H/H.  CMP was stable.         Allergies  Allergen Reactions  . Strawberry Itching and Swelling    Current Outpatient Prescriptions  Medication Sig Dispense Refill  . aspirin 81 MG chewable tablet Chew 1 tablet (81 mg total) by mouth daily.      [name] atorvastatin (LIPITOR) 40 MG tablet Take 1 tablet (40 mg total) by mouth daily at 6 PM.  30 tablet  0  . dexamethasone (DECADRON) 0.5 MG tablet Take 0.5 mg by mouth daily.       . insulin glargine (LANTUS) 100 UNIT/ML injection Inject 42 Units into the skin at bedtime.        . insulin lispro (HUMALOG) 100 UNIT/ML injection Inject 4-10 Units into the skin See admin instructions. Sliding scale      . INVOKANA 300 MG TABS Take 300 mg by mouth daily.       [name] levothyroxine (SYNTHROID, LEVOTHROID) 175 MCG tablet Take 175 mcg by mouth daily before breakfast.      . lisinopril (PRINIVIL,ZESTRIL) 20  MG tablet Take 1 tablet (20 mg total) by mouth daily.  30 tablet  0  . metoprolol tartrate (LOPRESSOR) 25 MG tablet Take 1 tablet (25 mg total) by mouth 2 (two) times daily.  60 tablet  6  . pantoprazole (PROTONIX) 40 MG tablet Take 1 tablet (40 mg total) by mouth 2 (two) times daily before a meal.  60 tablet  0  . sucralfate (CARAFATE) 1 G tablet Take 1 tablet (1 g total) by mouth 4 (four) times daily -  with meals and at bedtime.  120 tablet  0  . ticagrelor (BRILINTA) 90 MG TABS tablet Take 1 tablet (90 mg total) by mouth 2 (two) times daily.  60 tablet  0   No current facility-administered medications for this visit.    Past Medical History  Diagnosis Date  . Diabetes mellitus   . Thyroid disease   . Hypopituitarism   . CAD (coronary artery disease) 08/2013    STEMI, 100% occl RCA s/p DES stent. Out of Hospital  . LV dysfunction 08/2013    mild EF 50-55% after MI  . Sinus tachycardia     with exertion BB started  . Status post insertion of drug eluting coronary artery stent  to RCA emergently 09/16/2013  . Acute blood loss anemia 09/16/2013  . Abnormal LFTs 09/16/2013  . ST elevation (STEMI) myocardial infarction 08/29/2013    Past Surgical History  Procedure Laterality Date  . Pituitary surgery    . Colonoscopy N/A 08/31/2013    Procedure: COLONOSCOPY;  Surgeon: Juanita Craver, MD;  Location: [facility] ENDOSCOPY;  Service: Endoscopy;  Laterality: N/A;  . Esophagogastroduodenoscopy N/A 09/01/2013    Procedure: ESOPHAGOGASTRODUODENOSCOPY (EGD);  Surgeon: Beryle Beams, MD;  Location: [facility] ENDOSCOPY;  Service: Endoscopy;  Laterality: N/A;  bedside  . Coronary angioplasty with stent placement  08/29/2013    KVQ:QVZDGLO:VF colds or fevers, no weight changes Skin:no rashes or ulcers HEENT:no blurred vision, no congestion CV:see HPI PUL:see HPI GI:no diarrhea constipation or melena, no indigestion GU:no hematuria, no dysuria MS:no joint pain, no claudication Neuro:no syncope, no  lightheadedness Endo:+ diabetes stable, no thyroid disease  Wt Readings from Last 3 Encounters:  10/31/13 233 lb 2 oz (105.745 kg)  10/06/13 229 lb 8 oz (104.1 kg)  09/16/13 224 lb (101.606 kg)    PHYSICAL EXAM BP 110/60  Pulse 64  Ht 5\' 7"  (1.702 m)  Wt 233 lb 2 oz (105.745 kg)  BMI 36.50 kg/m2 General:Pleasant affect, NAD Skin:Warm and dry, brisk capillary refill HEENT:normocephalic, sclera clear, mucus membranes moist Neck:supple, no JVD, no bruits  Heart:S1S2 RRR without murmur, gallup, rub or click Lungs:clear without rales, rhonchi, or wheezes IEP:PIRJ, non tender, + BS, do not palpate liver spleen or masses Ext:no lower ext edema, 2+ pedal pulses, 2+ radial pulses, rt groin without hematoma, rt thigh with knots with medial knots and pain.  Neuro:alert and oriented X 3, MAE, follows commands, + facial symmetry  EKG:SB at 58 incomplete right bundle branch block T wave inversions lead 2, 3, aVF, lateral leads without acute changes recent inferior MI     ASSESSMENT AND PLAN Status post insertion of drug eluting coronary artery stent to [facility] emergently and PTCA to prox. PLA With recent STEMI, now pain free no complaints.  No SOB. Continue current meds.  Hyperlipidemia LDL goal <70 Recheck lipids now that she has been on 80 of lipitor.  Right thigh pain She had complained of rt thigh pain on last visit, we will check for pseudo aneurysm or hematoma.    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 Followed by PCp  Dizziness, normal othostatic BP check Plan for event monitor for 2 weeks.  Follow up with Dr. Roni Bread for results.     CAD (coronary artery disease) On statin, BB and ACE.  PVC's (premature ventricular contractions) Event monitor for freq PVCs with rehab, difficult to increase BB with soft BP.

## 2013-10-31 NOTE — Patient Instructions (Addendum)
Your physician recommends that you schedule a follow-up appointment in: 2 weeks with Dr. Ellyn Hack per Cecilie Kicks  Wear your Heart  Monitor for the next two weeks

## 2013-10-31 NOTE — Progress Notes (Signed)
Rehab report and rhythm strip given to pt to take with her to Dr. Debara Pickett appt today.

## 2013-10-31 NOTE — Progress Notes (Addendum)
Tammy Good 63 y.o. female Nutrition Note Spoke with pt.  Nutrition Plan and Nutrition Survey goals reviewed with pt. Pt is following Step 1 of the Therapeutic Lifestyle Changes diet. Opportunities for dietary changes include changing/decreasing consumption of whole milk, fried food, fat-laden breads (croissants,crackers, chips/cheese puffs), regular salad dressings and mayo, and butter/stick margarine.  Pt wants to lose wt. Pt has not been actively trying to lose wt. Pt feels "it's when I eat not what I eat." Eating every 3-5 hours discussed and wt loss tips reviewed. Pt is diabetic. Last A1c indicates blood glucose well-controlled. Pt checks her CBG's TID before meals. Fasting CBG's reportedly 70-121 mg/dL. Pt counts carbs and adjusts SSI to carbs consumed. Pt does not aim for a consistent amount of carbs at each meal. This Probation officer went over Diabetes Education test results. Pt on decadron. Pt currently not taking a Calcium/vitamin D supplement and pt is not consuming the recommended 1500 mg of calcium via her diet. Calcium/vitamin D supplementation discussed. Pt expressed understanding of the information reviewed. Pt aware of nutrition education classes offered and plans on attending nutrition classes.  Nutrition Diagnosis   Food-and nutrition-related knowledge deficit related to lack of exposure to information as related to diagnosis of: ? CVD ? DM (A1c 6.9)   Obesity related to excessive energy intake as evidenced by a BMI of 35.9  Nutrition RX/ Estimated Daily Nutrition Needs for: wt loss  1300-1800 Kcal, 35-50 gm fat, 8-14 gm sat fat, 1.3-1.8 gm trans-fat, <1500 mg sodium, 175-250 gm CHO   Nutrition Intervention   Pt's individual nutrition plan reviewed with pt.   Benefits of adopting Therapeutic Lifestyle Changes discussed when Medficts reviewed.   Pt to attend the Portion Distortion class - met; 10/26/13   Pt to attend the  ? Nutrition I class - met; 10/25/13                    ? Nutrition  II class        ? Diabetes Blitz class       ? Diabetes Q & A class   Pt given handouts for: ? 5-day, 1500 kcal menu ideas   Pt to consider taking Calcium Citrate supplement with vitamin D   Continue client-centered nutrition education by RD, as part of interdisciplinary care. Goal(s)   Pt to identify and limit food sources of saturated fat, trans fat, and cholesterol   Pt to identify food quantities necessary to achieve: ? wt loss to a goal wt of 205-223 lb (93.2-101.4 kg) at graduation from cardiac rehab.    Use pre-meal and post-meal CBG's and A1c to determine whether adjustments in food/meal planning will be beneficial or if any meds need to be combined with nutrition therapy. Monitor and Evaluate progress toward nutrition goal with team. Nutrition Risk: Change to Moderate Derek Mound, M.Ed, RD, LDN, CDE 10/31/2013 9:43 AM

## 2013-10-31 NOTE — Progress Notes (Signed)
Right Lower Ext. Limited Arterial Duplex Completed. Tammy Good, BS, RDMS, RVT  

## 2013-11-01 DIAGNOSIS — M79651 Pain in right thigh: Secondary | ICD-10-CM | POA: Insufficient documentation

## 2013-11-01 DIAGNOSIS — R42 Dizziness and giddiness: Secondary | ICD-10-CM | POA: Insufficient documentation

## 2013-11-01 DIAGNOSIS — I493 Ventricular premature depolarization: Secondary | ICD-10-CM | POA: Insufficient documentation

## 2013-11-01 NOTE — Assessment & Plan Note (Signed)
Stable. Followed by [employer]

## 2013-11-01 NOTE — Assessment & Plan Note (Signed)
Event monitor for freq PVCs with rehab, difficult to increase BB with soft BP.

## 2013-11-01 NOTE — Assessment & Plan Note (Signed)
On statin, BB and ACE.

## 2013-11-01 NOTE — Assessment & Plan Note (Signed)
On steroids. °

## 2013-11-01 NOTE — Assessment & Plan Note (Signed)
With recent STEMI, now pain free no complaints.  No SOB. Continue current meds.

## 2013-11-01 NOTE — Assessment & Plan Note (Signed)
Recheck lipids now that she has been on 80 of lipitor.

## 2013-11-01 NOTE — Assessment & Plan Note (Signed)
She had complained of rt thigh pain on last visit, we will check for pseudo aneurysm or hematoma.

## 2013-11-01 NOTE — Assessment & Plan Note (Signed)
improved

## 2013-11-01 NOTE — Assessment & Plan Note (Signed)
Plan for event monitor for 2 weeks.  Follow up with Dr. Roni Bread for results.

## 2013-11-02 ENCOUNTER — Encounter (HOSPITAL_COMMUNITY)
Admission: RE | Admit: 2013-11-02 | Discharge: 2013-11-02 | Disposition: A | Discharge status: Home / Self Care | Payer: BC Managed Care – PPO | Source: Ambulatory Visit | Attending: Internal Medicine | Admitting: Internal Medicine

## 2013-11-02 DIAGNOSIS — I219 Acute myocardial infarction, unspecified: Secondary | ICD-10-CM | POA: Diagnosis not present

## 2013-11-04 ENCOUNTER — Encounter (HOSPITAL_COMMUNITY)
Admission: RE | Admit: 2013-11-04 | Discharge: 2013-11-04 | Disposition: A | Discharge status: Home / Self Care | Payer: BC Managed Care – PPO | Source: Ambulatory Visit | Attending: Internal Medicine | Admitting: Internal Medicine

## 2013-11-04 DIAGNOSIS — I219 Acute myocardial infarction, unspecified: Secondary | ICD-10-CM | POA: Diagnosis not present

## 2013-11-07 ENCOUNTER — Encounter (HOSPITAL_COMMUNITY)
Admission: RE | Admit: 2013-11-07 | Discharge: 2013-11-07 | Disposition: A | Discharge status: Home / Self Care | Payer: BC Managed Care – PPO | Source: Ambulatory Visit | Attending: Internal Medicine | Admitting: Internal Medicine

## 2013-11-07 DIAGNOSIS — I219 Acute myocardial infarction, unspecified: Secondary | ICD-10-CM | POA: Diagnosis not present

## 2013-11-09 ENCOUNTER — Encounter (HOSPITAL_COMMUNITY)
Admission: RE | Admit: 2013-11-09 | Discharge: 2013-11-09 | Disposition: A | Discharge status: Home / Self Care | Payer: BC Managed Care – PPO | Source: Ambulatory Visit | Attending: Internal Medicine | Admitting: Internal Medicine

## 2013-11-09 DIAGNOSIS — I219 Acute myocardial infarction, unspecified: Secondary | ICD-10-CM | POA: Diagnosis not present

## 2013-11-11 ENCOUNTER — Encounter (HOSPITAL_COMMUNITY): Payer: BC Managed Care – PPO

## 2013-11-14 ENCOUNTER — Encounter (HOSPITAL_COMMUNITY)
Admission: RE | Admit: 2013-11-14 | Discharge: 2013-11-14 | Disposition: A | Discharge status: Home / Self Care | Payer: BC Managed Care – PPO | Source: Ambulatory Visit | Attending: Internal Medicine | Admitting: Internal Medicine

## 2013-11-14 DIAGNOSIS — I219 Acute myocardial infarction, unspecified: Secondary | ICD-10-CM | POA: Diagnosis not present

## 2013-11-15 ENCOUNTER — Telehealth: Payer: Self-pay | Admitting: *Deleted

## 2013-11-15 ENCOUNTER — Other Ambulatory Visit: Payer: Self-pay | Admitting: *Deleted

## 2013-11-15 DIAGNOSIS — R42 Dizziness and giddiness: Secondary | ICD-10-CM

## 2013-11-15 NOTE — Telephone Encounter (Signed)
Spoke to patient. Result given . Verbalized understanding HAS APPOINTMENT ON 11/17/13 WITH DR HARDING.

## 2013-11-15 NOTE — Telephone Encounter (Signed)
Message copied by Raiford Simmonds on Tue Nov 15, 2013  6:08 PM ------      Message from: Isaiah Serge      Created: Tue Nov 15, 2013  4:45 PM       PVCs on monitor, like in rehab keep appt with [facility] ------

## 2013-11-16 ENCOUNTER — Encounter (HOSPITAL_COMMUNITY): Payer: BC Managed Care – PPO

## 2013-11-17 ENCOUNTER — Encounter: Payer: Self-pay | Admitting: Internal Medicine

## 2013-11-17 ENCOUNTER — Encounter: Payer: Self-pay | Admitting: Cardiology

## 2013-11-17 ENCOUNTER — Ambulatory Visit (INDEPENDENT_AMBULATORY_CARE_PROVIDER_SITE_OTHER): Payer: BC Managed Care – PPO | Admitting: Cardiology

## 2013-11-17 VITALS — BP 110/60 | HR 64 | Ht 67.0 in | Wt 230.9 lb

## 2013-11-17 DIAGNOSIS — M79651 Pain in right thigh: Secondary | ICD-10-CM

## 2013-11-17 DIAGNOSIS — E785 Hyperlipidemia, unspecified: Secondary | ICD-10-CM

## 2013-11-17 DIAGNOSIS — I10 Essential (primary) hypertension: Secondary | ICD-10-CM

## 2013-11-17 DIAGNOSIS — I2510 Atherosclerotic heart disease of native coronary artery without angina pectoris: Secondary | ICD-10-CM

## 2013-11-17 DIAGNOSIS — Z9861 Coronary angioplasty status: Secondary | ICD-10-CM

## 2013-11-17 DIAGNOSIS — E1169 Type 2 diabetes mellitus with other specified complication: Secondary | ICD-10-CM | POA: Insufficient documentation

## 2013-11-17 DIAGNOSIS — E669 Obesity, unspecified: Secondary | ICD-10-CM | POA: Insufficient documentation

## 2013-11-17 DIAGNOSIS — E119 Type 2 diabetes mellitus without complications: Secondary | ICD-10-CM | POA: Insufficient documentation

## 2013-11-17 DIAGNOSIS — I2119 ST elevation (STEMI) myocardial infarction involving other coronary artery of inferior wall: Secondary | ICD-10-CM

## 2013-11-17 DIAGNOSIS — I4949 Other premature depolarization: Secondary | ICD-10-CM

## 2013-11-17 DIAGNOSIS — R42 Dizziness and giddiness: Secondary | ICD-10-CM

## 2013-11-17 DIAGNOSIS — I493 Ventricular premature depolarization: Secondary | ICD-10-CM

## 2013-11-17 DIAGNOSIS — M79609 Pain in unspecified limb: Secondary | ICD-10-CM

## 2013-11-17 DIAGNOSIS — Z955 Presence of coronary angioplasty implant and graft: Secondary | ICD-10-CM

## 2013-11-17 HISTORY — DX: Obesity, unspecified: E66.9

## 2013-11-17 NOTE — Assessment & Plan Note (Signed)
She had Dopplers done of her lower legs which showed normal vasculature but there was the evidence of an mass noted on ultrasound. 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 Recommend that she use ice try to decrease some inflammation but ice prior to her exercise.

## 2013-11-17 NOTE — Assessment & Plan Note (Signed)
On to a dual antiplatelet therapy - at this point it is okay to hold aspirin intermittently as needed for severe bruising

## 2013-11-17 NOTE — Assessment & Plan Note (Signed)
Blood pressures today are 846 systolic range. I would like to shoot for slightly high blood pressure but cutting her ACE inhibitor dose in half. Her blood pressure ranged in the 96/29/5284 range systolic to avoid symptomatic orthostatic hypotension.

## 2013-11-17 NOTE — Assessment & Plan Note (Signed)
She had most of the notable lesions treated during her hospital stay. 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 No melena or hematochezia or hematemesis.  She is on dual therapy along with a statin, beta blocker and ACE inhibitor. 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  Pain: Continue current medication and cardiac rehabilitation.

## 2013-11-17 NOTE — Assessment & Plan Note (Signed)
Tammy Good had no orthostatic pressures during last visit, but no findings on the monitor does show anything to explain her dizziness either. My suspicion is that Tammy Good still may be a little orthostatic depending on her volume status.  Plan: Reduce lisinopril to 10 mg daily.  Ensure adequate hydration and counseled her on the importance of stable dietary intake to maintain fluid volume.

## 2013-11-17 NOTE — Assessment & Plan Note (Signed)
On combination insulin with Lantus and Humalog plus oral Invokana. Monitored by PCP.

## 2013-11-17 NOTE — Assessment & Plan Note (Signed)
Forcefully she hasn't gained weight in the last few weeks, probably from recovery after hospitalization. Hopefully this was not be a trend as she is becoming more active in cardiac rehabilitation. She's also study in the diet recommendations in order to hopefully adjust her diet which can expedite her weight loss.

## 2013-11-17 NOTE — Assessment & Plan Note (Signed)
Doing well with no further symptoms - her main symptoms are actually GI.  Continue cardiac rehabilitation.

## 2013-11-17 NOTE — Patient Instructions (Addendum)
Decrease lisinopril 20 mg to 1/2 tablet daily.   Continue other medications.  Your physician wants you to follow-up in 4 months Dr Ellyn Hack.  You will receive a reminder letter in the mail two months in advance. If you don't receive a letter, please call our office to schedule the follow-up appointment.

## 2013-11-17 NOTE — Assessment & Plan Note (Addendum)
She continues to be on 40 mg of Lipitor. Her PCP is following her lipids and she needed to have them checked soon. Her LDL goal is now <70.

## 2013-11-17 NOTE — Progress Notes (Signed)
PATIENT: Tammy Good MRN: [id number] DOB: 03-11-1951 PCP: Sheela Stack, MD  Clinic Note: Chief Complaint  Patient presents with  . ROV 2 week    C/o lightheadedness and dizziness.    HPI: Tammy Good is a 63 y.o. female with a PMH below who presents today for two-week followup after last seeing Mickel Baas in [place], NP-C. as a second post hospital visit for inferior ST elevation MI.  I first met her in June of 2015 when she presented with an atypical presentation to the emergency room having an diarrhea abdominal pains and nausea with no vomiting. She been having intermittent episodes for couple days and finally came to the emergency room where she was found to have amongst other symptoms significant inferior ST elevations. Once stabilized the emergency room she was taken to the Cath Lab and found to have an occluded RCA that was treated with a drug-eluting stent. There was evidence of what appeared to be subacute thrombosis of the RCA suggesting that the occlusion and happened a couple days before. She was in the hospital for about 6 days, and discharged without significant complications. She had mild periprocedural anemia with abnormal LFTs have all resolved. She did have what appeared to be mild GI bleeding from duodenal ulcers and gastric erosions. 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  She was initially in what appeared to be possibly cardiac shock requiring short-term pressors. Thankfully she does not remember much of her hospital visit. She saw Cecilie Kicks on June 26 and was doing relatively well post recovery.  She was started on low-dose beta blocker due to Tachycardia. She had been titrated back up to 20 mg of lisinopril as well.    During Her Followup Visit in August, She Noted Lightheadedness and Dizziness and Had a Cardiac Event Monitor Placed That Failed to Reveal Any Significant Arrhythmias, but Did Note Some PVCs.  She has been doing cardiac  rehabilitation since her first visit, and has taken the dietary suggestions the heart. She is hoping that she will we'll start losing weight.  Interval History:  since her last visit, she describes several symptoms. One is on a sensitive dizziness and almost near syncope but usually happens not when initially standing, but once she is has been walking and arrives somewhere and stops she'll start feeling very lightheaded and dizzy but it resolves after she bends down and takes a deep breath. She has not actually had any syncope, and the symptoms only last less than a minute. She denies any TIA or amaurosis fugax symptoms.  She never did have true anginal symptoms the time of her heart attack, and continued to deny any chest tightness or pressure with rest or exertion. She does get mildly dyspneic with significant exertion, denies any standard exertional dyspnea with mild to moderate exertion.  She has a problem with constipation but she is not complaining of nail. No melena, hematochezia or hematuria. No epistaxis. No claudication symptoms, but she has noted some discomfort and pain along the right lateral thigh that gets worse with exertion and is not in a typical claudication pattern and is revolving around a knot of tissue that is somewhat swollen. The discomfort radiates from that.  The remainder of cardiac review of systems is as follows: No PND, orthopnea or edema. No palpitations   Past Medical History  Diagnosis Date  . ST elevation myocardial infarction (STEMI) of inferior wall, subsequent episode of care 08/2013    80% branch of  D1, 40% mid AV groove circumflex, 100% RCA with subacute thrombus -- thrombus extending into RPA V with 100% occlusion after initial angioplasty of mid RCA ;; Post MI ECHO: EF 50-55%, mild LVH with moderate HK of inferior wall, Gr1 DD, mild LA dilation; mildly reduced RV function  . CAD S/P percutaneous coronary angioplasty 08/2013    100% mRCA - PCI Integrity  Resolute DES 3.0 mm x 38 mm - 3.35 mm; PTCA of RPA V 2.0 mm x 15 mm  . Status post insertion of drug eluting coronary artery stent to RCA emergently 09/16/2013  . Diabetes mellitus type 2 in obese     On insulin and Invokana  . Mild essential hypertension   . Hypothyroidism (acquired)   . Panhypopituitarism   . Hyperlipidemia with target LDL less than 70   . Obesity (BMI 30-39.9) 11/17/2013   Prior Cardiac Evaluation and Past Surgical History: Past Surgical History  Procedure Laterality Date  . Pituitary surgery    . Colonoscopy N/A 08/31/2013    Procedure: COLONOSCOPY;  Surgeon: Juanita Craver, MD;  Location: [facility] ENDOSCOPY;  Service: Endoscopy;  Laterality: N/A;  . Esophagogastroduodenoscopy N/A 09/01/2013    Procedure: ESOPHAGOGASTRODUODENOSCOPY (EGD);  Surgeon: Beryle Beams, MD;  Location: [facility] ENDOSCOPY;  Service: Endoscopy;  Laterality: N/A;  bedside  . Coronary angioplasty with stent placement  08/29/2013    Integrity Resolute DES 2.0 mm x 38 mm -- 3.35 mm.; PTCA of proximal RPA V. - 2.0 mm x 15 mm balloon  . Cardiac event monitor  July-August 2015    Sinus rhythm with PVCs    Allergies  Allergen Reactions  . Strawberry Itching and Swelling    Current Outpatient Prescriptions  Medication Sig Dispense Refill  . aspirin 81 MG chewable tablet Chew 1 tablet (81 mg total) by mouth daily.      [name] atorvastatin (LIPITOR) 40 MG tablet Take 1 tablet (40 mg total) by mouth daily at 6 PM.  30 tablet  0  . dexamethasone (DECADRON) 0.5 MG tablet Take 0.5 mg by mouth daily.       . insulin glargine (LANTUS) 100 UNIT/ML injection Inject 42 Units into the skin at bedtime.        . insulin lispro (HUMALOG) 100 UNIT/ML injection Inject 4-10 Units into the skin See admin instructions. Sliding scale      . INVOKANA 300 MG TABS Take 300 mg by mouth daily.       [name] levothyroxine (SYNTHROID, LEVOTHROID) 175 MCG tablet Take 175 mcg by mouth daily before breakfast.      . lisinopril (PRINIVIL,ZESTRIL) 20 MG  tablet Take 1/2 tablet by mouth daily (start on 11/17/13)      . metoprolol tartrate (LOPRESSOR) 25 MG tablet Take 1 tablet (25 mg total) by mouth 2 (two) times daily.  60 tablet  6  . pantoprazole (PROTONIX) 40 MG tablet Take 1 tablet (40 mg total) by mouth 2 (two) times daily before a meal.  60 tablet  0  . sucralfate (CARAFATE) 1 G tablet Take 1 tablet (1 g total) by mouth 4 (four) times daily -  with meals and at bedtime.  120 tablet  0  . ticagrelor (BRILINTA) 90 MG TABS tablet Take 1 tablet (90 mg total) by mouth 2 (two) times daily.  60 tablet  0   No current facility-administered medications for this visit.    History   Social History Narrative   Widow. Works at [employer].  Former smoker.   Currently in cardiac rehabilitation doing exercise and dietary modification.    Family History: Alzheimer's disease in her maternal grandmother; Cancer in her sister; Cancer (age of onset: 30) in her mother; Heart attack (age of onset: 24) in her father.  ROS: A comprehensive Review of Systems -  Review of Systems  Constitutional: Negative for weight loss, malaise/fatigue and diaphoresis.  HENT: Negative for nosebleeds.   Eyes: Negative for blurred vision and double vision.  Respiratory: Negative for cough, hemoptysis, sputum production, shortness of breath and wheezing.   Cardiovascular:       Negative per history of present illness  Gastrointestinal: Positive for constipation. Negative for blood in stool and melena.  Musculoskeletal: Negative for back pain and neck pain.       Right thigh pain as described above.  Neurological: Positive for dizziness. Negative for sensory change, speech change, focal weakness, seizures, loss of consciousness and weakness.  Endo/Heme/Allergies: Bruises/bleeds easily.  Psychiatric/Behavioral: Negative for depression. The patient is not nervous/anxious.    Wt Readings from Last 3 Encounters:  11/17/13 230 lb 14.4 oz (104.736 kg)  10/31/13 233 lb 2  oz (105.745 kg)  10/06/13 229 lb 8 oz (104.1 kg)   PHYSICAL EXAM BP 110/60  Pulse 64  Ht _0  (1.702 m)  Wt 230 lb 14.4 oz (104.736 kg)  BMI 36.16 kg/m2 General appearance: alert, cooperative, appears stated age, no distress, moderately obese and Pleasant mood and affect. Neck: no adenopathy, no carotid bruit and Unable to assess JVP due to body habitus Lungs: clear to auscultation bilaterally, normal percussion bilaterally and Nonlabored, good air movement Heart: RRR, normal S1 and S2. Somewhat distant due to body habitus. No obvious M./R./G. Abdomen: soft, non-tender; bowel sounds normal; no masses,  no organomegaly and Obese Extremities: extremities normal, atraumatic, no cyanosis or edema and Right thigh has at least one tender knot/nodule that appears to be a lipoma. It is somewhat tender and painful. Pulses: 2+ and symmetric Skin: Skin color, texture, turgor normal. No rashes or lesions or Several small bruises are noted on the arms and chest from stickers. Neurologic: Grossly normal   Adult ECG Report not performed -   Recent Labs: CMP and CBC June 26 reviewed hemoglobin is stable, potassium stable. Glucose mildly elevated at 129; LFTs normal  ASSESSMENT / PLAN:  Dizziness, normal othostatic BP check She had no orthostatic pressures during last visit, but no findings on the monitor does show anything to explain her dizziness either. My suspicion is that she still may be a little orthostatic depending on her volume status.  Plan: Reduce lisinopril to 10 mg daily.  Ensure adequate hydration and counseled her on the importance of stable dietary intake to maintain fluid volume.  CAD S/P percutaneous coronary angioplasty She had most of the notable lesions treated during her hospital stay. 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 No melena or hematochezia or hematemesis.  She  is on dual therapy along with a statin, beta blocker and ACE inhibitor. 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  Pain: Continue current medication and cardiac rehabilitation.  ST elevation myocardial infarction (STEMI) of inferior wall, subsequent episode of care Doing well with no further symptoms - her main symptoms are actually GI.  Continue cardiac rehabilitation.  Status post insertion of drug eluting coronary artery stent to  mRCA emergently and PTCA to prox. PLA On to a dual antiplatelet therapy - at this point it is okay to hold aspirin intermittently as needed for severe bruising  Right thigh pain She had Dopplers done of her lower legs which showed normal vasculature but there was the evidence of an mass noted on ultrasound. 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 Recommend that she use ice try to decrease some inflammation but ice prior to her exercise.  PVC's (premature ventricular contractions) She saw PVCs noted on her monitor. At this point with concerns for dizziness and reluctant to increase beta blockers as they're probably benign PVCs. They're not symptomatic.  Mild essential hypertension Blood pressures today are 892 systolic range. I would like to shoot for slightly high blood pressure but cutting her ACE inhibitor dose in half. Her blood pressure ranged in the 11/94/1740 range systolic to avoid symptomatic orthostatic hypotension.  Hyperlipidemia LDL goal <70 She continues to be on 40 mg of Lipitor. Her PCP is following her lipids and she needed to have them checked soon. Her LDL goal is now <70.  Diabetes mellitus type 2 in obese On combination insulin with Lantus and Humalog plus oral Invokana. Monitored by PCP.   Obesity (BMI 30-39.9) Forcefully she hasn't gained weight in the  last few weeks, probably from recovery after hospitalization. Hopefully this was not be a trend as she is becoming more active in cardiac rehabilitation. She's also study in the diet recommendations in order to hopefully adjust her diet which can expedite her weight loss.    No orders of the defined types were placed in this encounter.   Meds ordered this encounter  Medications  . lisinopril (PRINIVIL,ZESTRIL) 20 MG tablet    Sig: Take 1/2 tablet by mouth daily (start on 11/17/13)    Followup:  4 months  HARDING,DAVID W, M.D., M.S. Interventional Cardiologist   Pager # [phone] 11/17/2013

## 2013-11-17 NOTE — Assessment & Plan Note (Signed)
She saw PVCs noted on her monitor. At this point with concerns for dizziness and reluctant to increase beta blockers as they're probably benign PVCs. They're not symptomatic.

## 2013-11-18 ENCOUNTER — Encounter (HOSPITAL_COMMUNITY)
Admission: RE | Admit: 2013-11-18 | Discharge: 2013-11-18 | Disposition: A | Discharge status: Home / Self Care | Payer: BC Managed Care – PPO | Source: Ambulatory Visit | Attending: Internal Medicine | Admitting: Internal Medicine

## 2013-11-18 DIAGNOSIS — I219 Acute myocardial infarction, unspecified: Secondary | ICD-10-CM | POA: Diagnosis not present

## 2013-11-21 ENCOUNTER — Encounter (HOSPITAL_COMMUNITY): Payer: BC Managed Care – PPO

## 2013-11-23 ENCOUNTER — Encounter (HOSPITAL_COMMUNITY)
Admission: RE | Admit: 2013-11-23 | Discharge: 2013-11-23 | Disposition: A | Discharge status: Home / Self Care | Payer: BC Managed Care – PPO | Source: Ambulatory Visit | Attending: Internal Medicine | Admitting: Internal Medicine

## 2013-11-23 DIAGNOSIS — Z9861 Coronary angioplasty status: Secondary | ICD-10-CM | POA: Insufficient documentation

## 2013-11-23 DIAGNOSIS — I219 Acute myocardial infarction, unspecified: Secondary | ICD-10-CM | POA: Diagnosis present

## 2013-11-25 ENCOUNTER — Encounter (HOSPITAL_COMMUNITY)
Admission: RE | Admit: 2013-11-25 | Discharge: 2013-11-25 | Disposition: A | Discharge status: Home / Self Care | Payer: BC Managed Care – PPO | Source: Ambulatory Visit | Attending: Internal Medicine | Admitting: Internal Medicine

## 2013-11-25 DIAGNOSIS — I219 Acute myocardial infarction, unspecified: Secondary | ICD-10-CM | POA: Diagnosis not present

## 2013-11-25 LAB — GLUCOSE, CAPILLARY: Glucose-Capillary: 199 mg/dL — ABNORMAL HIGH (ref 70–99)

## 2013-11-29 ENCOUNTER — Telehealth: Payer: Self-pay | Admitting: Cardiology

## 2013-11-29 NOTE — Telephone Encounter (Signed)
Left VM that patient is cleared to return to work. Advised her to call back if she needs this information sent to her employer.

## 2013-11-29 NOTE — Telephone Encounter (Signed)
Message forwarded to Dr. Ellyn Hack to advise on when patient can return to work.

## 2013-11-29 NOTE — Telephone Encounter (Signed)
Pt forgot to ask Dr Selena Batten when she saw him,when can she go back to work?

## 2013-11-29 NOTE — Telephone Encounter (Signed)
Should be able to return to work.  Leonie Man, MD

## 2013-11-30 ENCOUNTER — Encounter (HOSPITAL_COMMUNITY)
Admission: RE | Admit: 2013-11-30 | Discharge: 2013-11-30 | Disposition: A | Discharge status: Home / Self Care | Payer: BC Managed Care – PPO | Source: Ambulatory Visit | Attending: Internal Medicine | Admitting: Internal Medicine

## 2013-11-30 DIAGNOSIS — I219 Acute myocardial infarction, unspecified: Secondary | ICD-10-CM | POA: Diagnosis not present

## 2013-12-01 ENCOUNTER — Telehealth: Payer: Self-pay | Admitting: Cardiology

## 2013-12-01 NOTE — Telephone Encounter (Signed)
Pt called in stating that she needs a work release note saying that it is ok for her to return to work. She stated that this note could be faxed to [phone]) or emailed to her([email or web address]). If you have any questions she can be reached at [phone]. Please call  Thanks

## 2013-12-01 NOTE — Telephone Encounter (Signed)
Forwarded to Dr. Martin[place] and Malachy Mood

## 2013-12-02 ENCOUNTER — Encounter: Payer: Self-pay | Admitting: Cardiology

## 2013-12-02 ENCOUNTER — Encounter (HOSPITAL_COMMUNITY)
Admission: RE | Admit: 2013-12-02 | Discharge: 2013-12-02 | Disposition: A | Discharge status: Home / Self Care | Payer: BC Managed Care – PPO | Source: Ambulatory Visit | Attending: Internal Medicine | Admitting: Internal Medicine

## 2013-12-02 ENCOUNTER — Telehealth: Payer: Self-pay | Admitting: Cardiology

## 2013-12-02 ENCOUNTER — Encounter: Payer: Self-pay | Admitting: *Deleted

## 2013-12-02 ENCOUNTER — Encounter (HOSPITAL_COMMUNITY): Payer: BC Managed Care – PPO

## 2013-12-02 DIAGNOSIS — I219 Acute myocardial infarction, unspecified: Secondary | ICD-10-CM | POA: Diagnosis not present

## 2013-12-02 NOTE — Telephone Encounter (Signed)
Pt returning your call,concerning her going back to work.

## 2013-12-02 NOTE — Telephone Encounter (Signed)
Spoke with patient. Needs return to work note faxed to her at [phone]. Letter composed and faxed.

## 2013-12-05 ENCOUNTER — Telehealth: Payer: Self-pay | Admitting: Cardiology

## 2013-12-05 ENCOUNTER — Encounter (HOSPITAL_COMMUNITY)
Admission: RE | Admit: 2013-12-05 | Discharge: 2013-12-05 | Disposition: A | Discharge status: Home / Self Care | Payer: BC Managed Care – PPO | Source: Ambulatory Visit | Attending: Internal Medicine | Admitting: Internal Medicine

## 2013-12-05 DIAGNOSIS — I219 Acute myocardial infarction, unspecified: Secondary | ICD-10-CM | POA: Diagnosis not present

## 2013-12-05 NOTE — Telephone Encounter (Signed)
Spoke with pt, aware letter faxed to the number provided and received confirmation it went through.

## 2013-12-05 NOTE — Telephone Encounter (Signed)
Pt called in stating that she not yet received the fax from the doctor stating that it is ok for her to return back to work. The fax number is [phone]. Please call  Thanks

## 2013-12-07 ENCOUNTER — Telehealth (HOSPITAL_COMMUNITY): Payer: Self-pay | Admitting: Cardiac Rehabilitation

## 2013-12-07 ENCOUNTER — Telehealth (HOSPITAL_COMMUNITY): Payer: Self-pay | Admitting: Endocrinology

## 2013-12-07 ENCOUNTER — Encounter (HOSPITAL_COMMUNITY): Payer: BC Managed Care – PPO

## 2013-12-07 NOTE — Telephone Encounter (Signed)
pc received from pt reporting absence from cardiac rehab today due to possible UTI.  Pt will contact her PCP for treatment.

## 2013-12-09 ENCOUNTER — Encounter (HOSPITAL_COMMUNITY)
Admission: RE | Admit: 2013-12-09 | Discharge: 2013-12-09 | Disposition: A | Discharge status: Home / Self Care | Payer: BC Managed Care – PPO | Source: Ambulatory Visit | Attending: Internal Medicine | Admitting: Internal Medicine

## 2013-12-09 DIAGNOSIS — I219 Acute myocardial infarction, unspecified: Secondary | ICD-10-CM | POA: Diagnosis not present

## 2013-12-12 ENCOUNTER — Encounter (HOSPITAL_COMMUNITY)
Admission: RE | Admit: 2013-12-12 | Discharge: 2013-12-12 | Disposition: A | Discharge status: Home / Self Care | Payer: BC Managed Care – PPO | Source: Ambulatory Visit | Attending: Internal Medicine | Admitting: Internal Medicine

## 2013-12-12 DIAGNOSIS — I219 Acute myocardial infarction, unspecified: Secondary | ICD-10-CM | POA: Diagnosis not present

## 2013-12-14 ENCOUNTER — Encounter (HOSPITAL_COMMUNITY)
Admission: RE | Admit: 2013-12-14 | Discharge: 2013-12-14 | Disposition: A | Discharge status: Home / Self Care | Payer: BC Managed Care – PPO | Source: Ambulatory Visit | Attending: Internal Medicine | Admitting: Internal Medicine

## 2013-12-14 DIAGNOSIS — I219 Acute myocardial infarction, unspecified: Secondary | ICD-10-CM | POA: Diagnosis not present

## 2013-12-16 ENCOUNTER — Encounter (HOSPITAL_COMMUNITY)
Admission: RE | Admit: 2013-12-16 | Discharge: 2013-12-16 | Disposition: A | Discharge status: Home / Self Care | Payer: BC Managed Care – PPO | Source: Ambulatory Visit | Attending: Internal Medicine | Admitting: Internal Medicine

## 2013-12-16 DIAGNOSIS — I219 Acute myocardial infarction, unspecified: Secondary | ICD-10-CM | POA: Diagnosis not present

## 2013-12-19 ENCOUNTER — Encounter (HOSPITAL_COMMUNITY): Payer: BC Managed Care – PPO

## 2013-12-21 ENCOUNTER — Encounter (HOSPITAL_COMMUNITY)
Admission: RE | Admit: 2013-12-21 | Discharge: 2013-12-21 | Disposition: A | Discharge status: Home / Self Care | Payer: BC Managed Care – PPO | Source: Ambulatory Visit | Attending: Internal Medicine | Admitting: Internal Medicine

## 2013-12-21 DIAGNOSIS — I219 Acute myocardial infarction, unspecified: Secondary | ICD-10-CM | POA: Diagnosis not present

## 2013-12-23 ENCOUNTER — Encounter (HOSPITAL_COMMUNITY): Payer: BC Managed Care – PPO

## 2013-12-26 ENCOUNTER — Encounter (HOSPITAL_COMMUNITY): Payer: BC Managed Care – PPO

## 2013-12-28 ENCOUNTER — Encounter (HOSPITAL_COMMUNITY): Payer: BC Managed Care – PPO

## 2013-12-30 ENCOUNTER — Encounter (HOSPITAL_COMMUNITY)
Admission: RE | Admit: 2013-12-30 | Discharge: 2013-12-30 | Disposition: A | Discharge status: Home / Self Care | Payer: BC Managed Care – PPO | Source: Ambulatory Visit | Attending: Internal Medicine | Admitting: Internal Medicine

## 2013-12-30 DIAGNOSIS — I213 ST elevation (STEMI) myocardial infarction of unspecified site: Secondary | ICD-10-CM | POA: Diagnosis present

## 2013-12-30 DIAGNOSIS — Z955 Presence of coronary angioplasty implant and graft: Secondary | ICD-10-CM | POA: Diagnosis present

## 2014-01-02 ENCOUNTER — Encounter (HOSPITAL_COMMUNITY)
Admission: RE | Admit: 2014-01-02 | Discharge: 2014-01-02 | Disposition: A | Discharge status: Home / Self Care | Payer: BC Managed Care – PPO | Source: Ambulatory Visit | Attending: Internal Medicine | Admitting: Internal Medicine

## 2014-01-02 DIAGNOSIS — I213 ST elevation (STEMI) myocardial infarction of unspecified site: Secondary | ICD-10-CM | POA: Diagnosis not present

## 2014-01-04 ENCOUNTER — Encounter (HOSPITAL_COMMUNITY)
Admission: RE | Admit: 2014-01-04 | Discharge: 2014-01-04 | Disposition: A | Discharge status: Home / Self Care | Payer: BC Managed Care – PPO | Source: Ambulatory Visit | Attending: Internal Medicine | Admitting: Internal Medicine

## 2014-01-04 DIAGNOSIS — I213 ST elevation (STEMI) myocardial infarction of unspecified site: Secondary | ICD-10-CM | POA: Diagnosis not present

## 2014-01-06 ENCOUNTER — Other Ambulatory Visit: Payer: Self-pay

## 2014-01-06 ENCOUNTER — Encounter (HOSPITAL_COMMUNITY)
Admission: RE | Admit: 2014-01-06 | Discharge: 2014-01-06 | Disposition: A | Discharge status: Home / Self Care | Payer: BC Managed Care – PPO | Source: Ambulatory Visit | Attending: Internal Medicine | Admitting: Internal Medicine

## 2014-01-06 DIAGNOSIS — I213 ST elevation (STEMI) myocardial infarction of unspecified site: Secondary | ICD-10-CM | POA: Diagnosis not present

## 2014-01-09 ENCOUNTER — Encounter (HOSPITAL_COMMUNITY): Payer: BC Managed Care – PPO

## 2014-01-11 ENCOUNTER — Encounter (HOSPITAL_COMMUNITY): Payer: BC Managed Care – PPO

## 2014-01-13 ENCOUNTER — Encounter (HOSPITAL_COMMUNITY): Payer: BC Managed Care – PPO

## 2014-01-16 ENCOUNTER — Encounter (HOSPITAL_COMMUNITY)
Admission: RE | Admit: 2014-01-16 | Discharge: 2014-01-16 | Disposition: A | Discharge status: Home / Self Care | Payer: BC Managed Care – PPO | Source: Ambulatory Visit | Attending: Internal Medicine | Admitting: Internal Medicine

## 2014-01-16 DIAGNOSIS — I213 ST elevation (STEMI) myocardial infarction of unspecified site: Secondary | ICD-10-CM | POA: Diagnosis not present

## 2014-01-18 ENCOUNTER — Encounter (HOSPITAL_COMMUNITY): Payer: BC Managed Care – PPO

## 2014-01-20 ENCOUNTER — Encounter (HOSPITAL_COMMUNITY): Payer: BC Managed Care – PPO

## 2014-01-23 ENCOUNTER — Encounter (HOSPITAL_COMMUNITY): Payer: BC Managed Care – PPO

## 2014-01-25 ENCOUNTER — Encounter (HOSPITAL_COMMUNITY): Payer: BC Managed Care – PPO

## 2014-01-27 ENCOUNTER — Encounter (HOSPITAL_COMMUNITY)
Admission: RE | Admit: 2014-01-27 | Discharge: 2014-01-27 | Disposition: A | Discharge status: Home / Self Care | Payer: BC Managed Care – PPO | Source: Ambulatory Visit | Attending: Internal Medicine | Admitting: Internal Medicine

## 2014-01-27 DIAGNOSIS — Z955 Presence of coronary angioplasty implant and graft: Secondary | ICD-10-CM | POA: Diagnosis present

## 2014-01-27 DIAGNOSIS — I213 ST elevation (STEMI) myocardial infarction of unspecified site: Secondary | ICD-10-CM | POA: Diagnosis present

## 2014-01-30 ENCOUNTER — Encounter (HOSPITAL_COMMUNITY): Payer: BC Managed Care – PPO

## 2014-02-01 ENCOUNTER — Encounter (HOSPITAL_COMMUNITY): Payer: BC Managed Care – PPO

## 2014-02-01 ENCOUNTER — Other Ambulatory Visit: Payer: Self-pay | Admitting: Orthopedic Surgery

## 2014-02-01 DIAGNOSIS — M5441 Lumbago with sciatica, right side: Secondary | ICD-10-CM

## 2014-02-02 ENCOUNTER — Ambulatory Visit
Admission: RE | Admit: 2014-02-02 | Discharge: 2014-02-02 | Disposition: A | Discharge status: Home / Self Care | Payer: BC Managed Care – PPO | Source: Ambulatory Visit | Attending: Orthopedic Surgery | Admitting: Orthopedic Surgery

## 2014-02-02 DIAGNOSIS — M5441 Lumbago with sciatica, right side: Secondary | ICD-10-CM

## 2014-02-03 ENCOUNTER — Encounter (HOSPITAL_COMMUNITY): Payer: BC Managed Care – PPO

## 2014-02-06 ENCOUNTER — Encounter (HOSPITAL_COMMUNITY): Payer: BC Managed Care – PPO

## 2014-02-07 ENCOUNTER — Telehealth (HOSPITAL_COMMUNITY): Payer: Self-pay | Admitting: Cardiac Rehabilitation

## 2014-02-07 NOTE — Telephone Encounter (Signed)
pc to pt to assess reason for continued absence from cardiac rehab.  Left message on home answering machine.  

## 2014-02-08 ENCOUNTER — Encounter (HOSPITAL_COMMUNITY)
Admission: RE | Admit: 2014-02-08 | Discharge: 2014-02-08 | Disposition: A | Discharge status: Home / Self Care | Payer: BC Managed Care – PPO | Source: Ambulatory Visit | Attending: Internal Medicine | Admitting: Internal Medicine

## 2014-02-08 DIAGNOSIS — I213 ST elevation (STEMI) myocardial infarction of unspecified site: Secondary | ICD-10-CM | POA: Diagnosis not present

## 2014-02-10 ENCOUNTER — Encounter (HOSPITAL_COMMUNITY)
Admission: RE | Admit: 2014-02-10 | Discharge: 2014-02-10 | Disposition: A | Discharge status: Home / Self Care | Payer: BC Managed Care – PPO | Source: Ambulatory Visit | Attending: Internal Medicine | Admitting: Internal Medicine

## 2014-02-10 DIAGNOSIS — I213 ST elevation (STEMI) myocardial infarction of unspecified site: Secondary | ICD-10-CM | POA: Diagnosis not present

## 2014-02-14 NOTE — Progress Notes (Addendum)
Pt graduated from cardiac rehab program today with completion of 32 exercise sessions in Phase II. Pt attendance and progression were limited by hip pain.  Pt hip pain is being evaluated by neurologist.  Pt had good attendance and participation in cardiac education classes.     Medication list reconciled. Repeat  PHQ2 score- 0 .  Although pt is somewhat discouraged by her continued hip pain and is eager to explore treatment options for relief.    Pt plans to continue exercising on her own once this is resolved, using stationary bike and walking track.    Pt will need MD encouragement and support to continue making lifestyle changes.

## 2014-02-24 ENCOUNTER — Encounter: Payer: Self-pay | Admitting: Internal Medicine

## 2014-02-27 ENCOUNTER — Telehealth: Payer: Self-pay

## 2014-02-27 NOTE — Telephone Encounter (Signed)
Dr Ellyn Hack reviewed chart and does not give permission for patient hold Brilinta. It has only been 6 months since DES stents. If planned procedure is urgent, exceptions can be made. This is stated on the form and form was faxed.

## 2014-03-02 ENCOUNTER — Encounter (HOSPITAL_COMMUNITY): Payer: Self-pay | Admitting: Cardiology

## 2014-04-09 ENCOUNTER — Other Ambulatory Visit: Payer: Self-pay | Admitting: Cardiology

## 2014-08-23 ENCOUNTER — Other Ambulatory Visit (HOSPITAL_COMMUNITY): Payer: Self-pay | Admitting: *Deleted

## 2014-08-24 ENCOUNTER — Encounter (HOSPITAL_COMMUNITY)
Admission: RE | Admit: 2014-08-24 | Discharge: 2014-08-24 | Disposition: A | Discharge status: Home / Self Care | Payer: BC Managed Care – PPO | Source: Ambulatory Visit | Attending: Endocrinology | Admitting: Endocrinology

## 2014-08-24 DIAGNOSIS — D649 Anemia, unspecified: Secondary | ICD-10-CM | POA: Diagnosis present

## 2014-08-24 MED ORDER — SODIUM CHLORIDE 0.9 % IV SOLN
510.0000 mg | INTRAVENOUS | Status: DC
  Administered 2014-08-24: 510 mg via INTRAVENOUS
  Filled 2014-08-24: qty 17

## 2014-08-30 ENCOUNTER — Other Ambulatory Visit (HOSPITAL_COMMUNITY): Payer: Self-pay | Admitting: *Deleted

## 2014-08-31 ENCOUNTER — Encounter (HOSPITAL_COMMUNITY)
Admission: RE | Admit: 2014-08-31 | Discharge: 2014-08-31 | Disposition: A | Discharge status: Home / Self Care | Payer: BC Managed Care – PPO | Source: Ambulatory Visit | Attending: Endocrinology | Admitting: Endocrinology

## 2014-08-31 DIAGNOSIS — D649 Anemia, unspecified: Secondary | ICD-10-CM | POA: Insufficient documentation

## 2014-08-31 MED ORDER — SODIUM CHLORIDE 0.9 % IV SOLN
510.0000 mg | INTRAVENOUS | Status: AC
  Administered 2014-08-31: 510 mg via INTRAVENOUS
  Filled 2014-08-31: qty 17

## 2014-09-18 ENCOUNTER — Other Ambulatory Visit: Payer: Self-pay

## 2014-10-14 ENCOUNTER — Other Ambulatory Visit: Payer: Self-pay | Admitting: Cardiology

## 2014-10-16 NOTE — Telephone Encounter (Signed)
Rx(s) sent to pharmacy electronically.  

## 2014-12-08 ENCOUNTER — Encounter: Payer: Self-pay | Admitting: Cardiology

## 2014-12-08 ENCOUNTER — Ambulatory Visit (INDEPENDENT_AMBULATORY_CARE_PROVIDER_SITE_OTHER): Payer: BC Managed Care – PPO | Admitting: Cardiology

## 2014-12-08 VITALS — BP 122/50 | HR 66 | Ht 67.0 in | Wt 228.0 lb

## 2014-12-08 DIAGNOSIS — I2119 ST elevation (STEMI) myocardial infarction involving other coronary artery of inferior wall: Secondary | ICD-10-CM

## 2014-12-08 DIAGNOSIS — Z955 Presence of coronary angioplasty implant and graft: Secondary | ICD-10-CM

## 2014-12-08 DIAGNOSIS — M79651 Pain in right thigh: Secondary | ICD-10-CM

## 2014-12-08 DIAGNOSIS — D509 Iron deficiency anemia, unspecified: Secondary | ICD-10-CM

## 2014-12-08 DIAGNOSIS — I493 Ventricular premature depolarization: Secondary | ICD-10-CM | POA: Diagnosis not present

## 2014-12-08 DIAGNOSIS — I10 Essential (primary) hypertension: Secondary | ICD-10-CM | POA: Diagnosis not present

## 2014-12-08 DIAGNOSIS — I2510 Atherosclerotic heart disease of native coronary artery without angina pectoris: Secondary | ICD-10-CM | POA: Diagnosis not present

## 2014-12-08 DIAGNOSIS — Z9861 Coronary angioplasty status: Secondary | ICD-10-CM

## 2014-12-08 DIAGNOSIS — E785 Hyperlipidemia, unspecified: Secondary | ICD-10-CM

## 2014-12-08 DIAGNOSIS — E669 Obesity, unspecified: Secondary | ICD-10-CM

## 2014-12-08 NOTE — Patient Instructions (Signed)
Because of excess bruising  - hold Brilinta for 2-3 days but still take aspirin .  When you restart Brilitna stop taking Aspirin .  watch for increase bruising for a one month - call office to let us know your condition- may stop medication and try another medication.  Okay to hold Brilinta 7 days if you have any surgery or procedures   Your physician wants you to follow-up in 6 month with Dr Ellyn Hack. You will receive a reminder letter in the mail two months in advance. If you don't receive a letter, please call our office to schedule the follow-up appointment.

## 2014-12-08 NOTE — Progress Notes (Signed)
PCP: Sheela Stack, MD  Clinic Note: Chief Complaint  Patient presents with  . Follow-up  . Shortness of Breath    due to a cold  . Dizziness  . Edema    feet    HPI: Tammy Good is a 64 y.o. female with a PMH below who presents today for annual f/u of CAD-PCI (STEMI) .  Tammy Good was last seen on August 7 20/15 - for 2 month followup after an inferior STEMI. She was to return for followup in 4 months, but has been lost to followup. She had a very confusing presentation for MI and that she really noted abdominal discomfort with dyspnea and nausea. Notably, during her hospital stay following her MI, she did require blood transfusion.  mild LVH. EF 50-55%. Moderate HJ of the entire inferior myocardium. GR 1 DD. Mild LA dilation. Mildly reduced RV function   Recent Hospitalizations: none  Studies Reviewed:   Echocardiogram August 30, 2013 - post MI (entered into [facility]): mild LVH. EF 50-55%. Moderate HJ of the entire inferior myocardium. GR 1 DD. Mild LA dilation. Mildly reduced RV function  Lower extremity arterial Dopplers 10/31/2013: no evidence of dissection, AV fistula or active pseudoaneurysm. There was a solid mass appearing in right lateral upper thigh. 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  Interval History: over the last year since I last saw her, she has had several issues that happened most part been noncardiac related. She had a bad case of shingles about 3 months ago, but she is finally started to recover from. She was also found to be significantly anemic with a hemoglobin down as low as 8.  She basically presented feeling cold intolerance and tired fatigued, stumbling around. She had in our infusion and has been on iron supplements since. She felt notably better after the apparent effusion, still does note some exertional dyspnea.  She continues to deny any episodes or symptoms of chest tightness or pressure. She says thather stools are relatively dark since she's  been on iron but, has not noted melena hematochezia or hematuria or hematemesis. She has not seen a gastroenterologist to evaluate her anemia. She sleeps on one 2 pillows, usually this is more limited comfort and would be considered orthopnea. She also has 1-2+ lower extremity edema usually worse when she "hangs her feet down or is on her feet a lot. This usually is totally gone when she elevates her legs. She denies a rapid irregular heartbeat/palpitations. Not hurt anemia has been treated, she's not had any syncope or near syncopal episodes.  No TIA or amaurosis fugax symptoms..  Past Medical History  Diagnosis Date  . ST elevation myocardial infarction (STEMI) of inferior wall, subsequent episode of care 08/2013    80% branch of D1, 40% mid AV groove circumflex, 100% RCA with subacute thrombus -- thrombus extending into RPA V with 100% occlusion after initial angioplasty of mid RCA ;; Post MI ECHO 6/9/'15: EF 50-55%, mild LVH with moderate HK of inferior wall, Gr1 DD, mild LA dilation; mildly reduced RV function  . CAD S/P percutaneous coronary angioplasty 08/2013    100% mRCA - PCI Integrity Resolute DES 3.0 mm x 38 mm - 3.35 mm; PTCA of RPA V 2.0 mm x 15 mm  . Status post insertion of drug eluting coronary artery stent to RCA emergently 09/16/2013  . Diabetes mellitus type 2 in obese     On insulin and Invokana  . Mild essential hypertension   .  Hypothyroidism (acquired)   . Panhypopituitarism   . Hyperlipidemia with target LDL less than 70   . Obesity (BMI 30-39.9) 11/17/2013    Past Surgical History  Procedure Laterality Date  . Pituitary surgery    . Colonoscopy N/A 08/31/2013    Procedure: COLONOSCOPY;  Surgeon: Juanita Craver, MD;  Location: [facility] ENDOSCOPY;  Service: Endoscopy;  Laterality: N/A;  . Esophagogastroduodenoscopy N/A 09/01/2013    Procedure: ESOPHAGOGASTRODUODENOSCOPY (EGD);  Surgeon: Beryle Beams, MD;  Location: [facility] ENDOSCOPY;  Service: Endoscopy;  Laterality: N/A;  bedside  .  Coronary angioplasty with stent placement  08/29/2013    Integrity Resolute DES 2.0 mm x 38 mm -- 3.35 mm.; PTCA of proximal RPA V. - 2.0 mm x 15 mm balloon  . Cardiac event monitor  July-August 2015    Sinus rhythm with PVCs  . Left heart catheterization with coronary angiogram N/A 08/30/2013    Procedure: LEFT HEART CATHETERIZATION WITH CORONARY ANGIOGRAM;  Surgeon: Leonie Man, MD;  Location: [facility] CATH LAB;  Service: Cardiovascular;  Laterality: N/A;  . Percutaneous coronary stent intervention (pci-s)  08/30/2013    Procedure: PERCUTANEOUS CORONARY STENT INTERVENTION (PCI-S);  Surgeon: Leonie Man, MD;  Location: [facility] CATH LAB;  Service: Cardiovascular;;  . Intraoperative transthoracic echocardiogram  08/30/2013    mild LVH. EF 50-55%. Moderate HJ of the entire inferior myocardium. GR 1 DD. Mild LA dilation. Mildly reduced RV function    ROS: A comprehensive was performed. Review of Systems  Constitutional: Positive for malaise/fatigue (notalby improved after Iron infusion). Negative for fever and chills.  HENT: Positive for congestion (just getting over a cold).        Just started to get over a cold  Eyes: Negative for blurred vision.  Respiratory: Positive for cough and shortness of breath (She no more short of breath this last week because of her cold.).   Cardiovascular: Positive for leg swelling. Negative for claudication.  Gastrointestinal: Positive for constipation. Negative for blood in stool and melena.  Genitourinary: Negative for hematuria.  Musculoskeletal: Positive for myalgias (R thigh to knee) and joint pain (normal join pains).  Neurological:       She describes a tingling and occasionally shooting sensation from the right hip down to the medial aspect of the knee and down to the feet.  Endo/Heme/Allergies: Bruises/bleeds easily.  Psychiatric/Behavioral: Negative for depression. The patient is not nervous/anxious.   All other systems reviewed and are negative.   Prior  to Admission medications   Medication Sig Start Date End Date Taking? Authorizing Provider  aspirin 81 MG chewable tablet Chew 1 tablet (81 mg total) by mouth daily. 09/03/13  Yes Cherene Altes, MD  atorvastatin (LIPITOR) 40 MG tablet Take 1 tablet (40 mg total) by mouth daily at 6 PM. 09/03/13  Yes Cherene Altes, MD  dexamethasone (DECADRON) 0.5 MG tablet Take 0.5 mg by mouth daily.  09/03/13  Yes Cherene Altes, MD  insulin glargine (LANTUS) 100 UNIT/ML injection Inject 42 Units into the skin at bedtime.     Yes Historical Provider, MD  insulin lispro (HUMALOG) 100 UNIT/ML injection Inject 4-10 Units into the skin See admin instructions. Sliding scale   Yes Historical Provider, MD  INVOKANA 300 MG TABS Take 300 mg by mouth daily.  08/23/13  Yes Historical Provider, MD  iron polysaccharides (FERREX 150) 150 MG capsule Take 150 mg by mouth daily.   Yes Historical Provider, MD  iron polysaccharides (NU-IRON) 150 MG capsule Take 150 mg  by mouth daily.   Yes Historical Provider, MD  levothyroxine (SYNTHROID, LEVOTHROID) 175 MCG tablet Take 175 mcg by mouth daily before breakfast.   Yes Historical Provider, MD  lisinopril (PRINIVIL,ZESTRIL) 20 MG tablet Take 1/2 tablet by mouth daily (start on 11/17/13) 09/03/13  Yes Cherene Altes, MD  metoprolol tartrate (LOPRESSOR) 25 MG tablet Take 1 tablet (25 mg total) by mouth 2 (two) times daily. <PLEASE MAKE APPOINTMENT FOR REFILLS> 10/16/14  Yes Leonie Man, MD  pantoprazole (PROTONIX) 40 MG tablet Take 1 tablet (40 mg total) by mouth 2 (two) times daily before a meal. 09/03/13  Yes Cherene Altes, MD  ticagrelor (BRILINTA) 90 MG TABS tablet Take 1 tablet (90 mg total) by mouth 2 (two) times daily. 09/03/13  Yes Cherene Altes, MD   Allergies  Allergen Reactions  . Strawberry Itching and Swelling   Social History   Social History  . Marital Status: Widowed    Spouse Name: N/A  . Number of Children: N/A  . Years of Education: N/A    Social History Main Topics  . Smoking status: Former Research scientist (life sciences)  . Smokeless tobacco: None  . Alcohol Use: No  . Drug Use: No  . Sexual Activity: Not Asked   Other Topics Concern  . None   Social History Narrative   Widow. Works at [employer].   Former smoker.   Currently in cardiac rehabilitation doing exercise and dietary modification.   Family History  Problem Relation Age of Onset  . Cancer Mother 75    multiple myeloma  . Heart attack Father 48  . Cancer Sister   . Alzheimer's disease Maternal Grandmother     Wt Readings from Last 3 Encounters:  12/08/14 228 lb (103.42 kg)  08/31/14 234 lb (106.142 kg)  11/17/13 230 lb 14.4 oz (104.736 kg)    PHYSICAL EXAM BP 122/50 mmHg  Pulse 66  Ht '5\' 7"'  (1.702 m)  Wt 228 lb (103.42 kg)  BMI 35.70 kg/m2 General appearance: alert, cooperative, appears stated age, no distress, moderately obese and Pleasant mood and affect. Neck: no adenopathy, no carotid bruit and Unable to assess JVP due to body habitus Lungs: clear to auscultation bilaterally, normal percussion bilaterally and Nonlabored, good air movement Heart: RRR, normal S1 and S2. Somewhat distant due to body habitus. No obvious M./R./G. Abdomen: soft, non-tender; bowel sounds normal; no masses, no organomegaly and Obese Extremities: extremities normal, atraumatic, no cyanosis or edema and Right thigh has at least one tender knot/nodule that appears to be a lipoma. It is somewhat tender and painful. Pulses: 2+ and symmetric Skin: Skin color, texture, turgor normal. No rashes or lesions or Several small bruises are noted on the arms and chest from stickers. Neurologic: Grossly normal   Adult ECG Report  Rate: 66 ;  Rhythm: normal sinus rhythm and Inferior lateral Q waves suggesting infarct, age undetermined. borderliine incomplete right bundle branch block with nonspecific ST-T wave changes.;   Narrative Interpretation: since last EKG, the T-wave inversions laterally  are no longer present suggesting completion of the MI evolutionary changes.   Other studies Reviewed: Additional studies/ records that were reviewed today include:  Recent Labs:  Checked by Dr. Forde Dandy -- not available   ASSESSMENT / PLAN: Problem List Items Addressed This Visit    CAD S/P percutaneous coronary angioplasty (Chronic)    She definitely had a significant amount of thrombus in the RCA. She is currently on aspirin and Brilinta. I am  stopping aspirin. She remains on beta blocker and ACE inhibitor as well as statin.I would not titrate medicines further.         Relevant Orders   EKG 12-Lead   Hyperlipidemia LDL goal <70 (Chronic)    She is on atorvastatin.  Labs being monitoredby PCP.      Iron deficiency anemia    Unfortunately, I don't know the details of this evaluation. I am concerned about presence or evidence of any significant GI bleeding with her being on antiplatelet therapy. It would be fine for her to suspend antiplatelet therapy for any necessary procedures.  She remains on iron supplementation, and does feel better. We'll defer further workup to her PCP, would consider GI evaluation.      Relevant Medications   iron polysaccharides (FERREX 150) 150 MG capsule   iron polysaccharides (NU-IRON) 150 MG capsule   Mild essential hypertension - Primary (Chronic)    Stable blood pressure on low dose ACE inhibitor and beta blocker. No need to titrate.      Relevant Orders   EKG 12-Lead   Obesity (BMI 30-39.9) (Chronic)    The patient understands the need to lose weight with diet and exercise. We have discussed specific strategies for this.      PVC's (premature ventricular contractions)    No significant PVCs noted on her current EKG. I did not hear ectopy on exam. No symptoms of palpitations. Continue current dose beta blocker.      Relevant Orders   EKG 12-Lead   Right thigh pain    I would defer further evaluation of this to her PCP. Counseling may  be neurologic. 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      ST elevation myocardial infarction (STEMI) of inferior wall, subsequent episode of care (Chronic)    Overall, doing relatively well from cardiac standpoint. Unfortunately she never really had much improved the cardiac symptoms. She does not seem to have any significant symptoms of heart failure. She does have exertional dyspnea which can easily be attributed to deconditioning and recent evaluation for anemia.  I encouraged her to continue to stay active with exercising in order to get a baseline level for future evaluations. I saw back to her post MI echo had a normal EF, I want to recheck just to see if wall motion changes. I would suspect she had a relatively sizable inferior infarct and may still have some hypokinesis in that area.      Relevant Orders   EKG 12-Lead   Status post insertion of drug eluting coronary artery stent to [facility] emergently and PTCA to prox. PLA (Chronic)    I think we can safely stop the aspirin. However with her having significant bruising right now and recent episode of anemia, I am inclined to have her stop her Brilinta for roughly 3 days while still taking aspirin. After that, she will stop aspirin and go back to [place]. We will reassess in about a month to see how her breathing is doing, and how stable her hemoglobin is. If there seems to be an issue, but then would be to switch her to maintenance dose of Plavix orally without aspirin.      Relevant Orders   EKG 12-Lead      Current medicines are reviewed at length with the  patient today. (+/- concerns) bruising & recent anemia The following changes have been made:   Because of excess bruising  - hold Brilinta for 2-3 days but still take aspirin .  When you  restart Brilitna stop taking Aspirin .  watch for increase bruising for a one month - call office to let us know your condition- may stop medication and try another medication.  Okay to hold Brilinta 7 days if you have any surgery or procedures   Your physician wants you to follow-up in 6 month with Dr Ellyn Hack.  Studies Ordered:   Orders Placed This Encounter  Procedures  . EKG 12-Lead      Leonie Man, M.D., M.S. Interventional Cardiologist   Pager # [phone]

## 2014-12-10 ENCOUNTER — Encounter: Payer: Self-pay | Admitting: Cardiology

## 2014-12-10 DIAGNOSIS — D509 Iron deficiency anemia, unspecified: Secondary | ICD-10-CM | POA: Insufficient documentation

## 2014-12-10 NOTE — Assessment & Plan Note (Signed)
No significant PVCs noted on her current EKG. I did not hear ectopy on exam. No symptoms of palpitations. Continue current dose beta blocker.

## 2014-12-10 NOTE — Assessment & Plan Note (Signed)
I would defer further evaluation of this to her PCP. Counseling may be neurologic. 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

## 2014-12-10 NOTE — Assessment & Plan Note (Signed)
I think we can safely stop the aspirin. However with her having significant bruising right now and recent episode of anemia, I am inclined to have her stop her Brilinta for roughly 3 days while still taking aspirin. After that, she will stop aspirin and go back to [place]. We will reassess in about a month to see how her breathing is doing, and how stable her hemoglobin is. If there seems to be an issue, but then would be to switch her to maintenance dose of Plavix orally without aspirin.

## 2014-12-10 NOTE — Assessment & Plan Note (Signed)
Stable blood pressure on low dose ACE inhibitor and beta blocker. No need to titrate.

## 2014-12-10 NOTE — Assessment & Plan Note (Signed)
Overall, doing relatively well from cardiac standpoint. Unfortunately she never really had much improved the cardiac symptoms. She does not seem to have any significant symptoms of heart failure. She does have exertional dyspnea which can easily be attributed to deconditioning and recent evaluation for anemia.  I encouraged her to continue to stay active with exercising in order to get a baseline level for future evaluations. I saw back to her post MI echo had a normal EF, I want to recheck just to see if wall motion changes. I would suspect she had a relatively sizable inferior infarct and may still have some hypokinesis in that area.

## 2014-12-10 NOTE — Assessment & Plan Note (Signed)
She definitely had a significant amount of thrombus in the RCA. She is currently on aspirin and Brilinta. I am stopping aspirin. She remains on beta blocker and ACE inhibitor as well as statin.I would not titrate medicines further.

## 2014-12-10 NOTE — Assessment & Plan Note (Signed)
She is on atorvastatin.  Labs being monitoredby PCP.

## 2014-12-10 NOTE — Assessment & Plan Note (Signed)
The patient understands the need to lose weight with diet and exercise. We have discussed specific strategies for this.  

## 2014-12-10 NOTE — Assessment & Plan Note (Signed)
Unfortunately, I don't know the details of this evaluation. I am concerned about presence or evidence of any significant GI bleeding with her being on antiplatelet therapy. It would be fine for her to suspend antiplatelet therapy for any necessary procedures.  She remains on iron supplementation, and does feel better. We'll defer further workup to her PCP, would consider GI evaluation.

## 2014-12-11 ENCOUNTER — Other Ambulatory Visit: Payer: Self-pay | Admitting: Cardiology

## 2014-12-11 NOTE — Telephone Encounter (Signed)
Rx request sent to pharmacy.  

## 2015-02-22 ENCOUNTER — Telehealth: Payer: Self-pay | Admitting: Cardiology

## 2015-02-22 NOTE — Telephone Encounter (Signed)
Mrs. Gaal is calling because she is still bruising even while going on and off of the Brilinta to the Aspirin .[name] Please call

## 2015-02-22 NOTE — Telephone Encounter (Signed)
Pt states she did med changes as instructed at last OV and is now taking Brilinta ONLY (no aspirin).  She states bruising much better than it was when she was seen in September, but that she is still having bruising. She does emphasize that there is noticeable improvement in regards to the severity/ease of bruising, but that she still bruises frequently.  She would like to know if anything further is recommended. Informed pt I would defer to Dr. Ellyn Hack.  From last OV note: "Because of excess bruising - hold Brilinta for 2-3 days but still take aspirin .  When you restart Brilitna stop taking Aspirin .  watch for increase bruising for a one month - call office to let us know your condition- may stop medication and try another medication.  Okay to hold Brilinta 7 days if you have any surgery or procedures"

## 2015-02-23 NOTE — Telephone Encounter (Signed)
Lets give it a while (3-4 weeks) off ASA to see how things go. If still has bad bruising, can convert to Plavix   Katie Faraone, Leonie Green, MD

## 2015-02-23 NOTE — Telephone Encounter (Signed)
Advice communicated, pt instructed to call again later this month - if bruising still problematic Dr. Ellyn Hack OK to switch to Plavix.

## 2015-04-16 ENCOUNTER — Other Ambulatory Visit: Payer: Self-pay | Admitting: Cardiology

## 2015-04-16 ENCOUNTER — Other Ambulatory Visit: Payer: Self-pay | Admitting: *Deleted

## 2015-04-16 MED ORDER — METOPROLOL TARTRATE 25 MG PO TABS: ORAL_TABLET | ORAL | Status: DC

## 2015-04-16 NOTE — Telephone Encounter (Signed)
°*  STAT* If patient is at the pharmacy, call can be transferred to refill team.   1. Which medications need to be refilled? (please list name of each medication and dose if known) Metoprolol-new pharmacy  2. Which pharmacy/location (including street and city if local pharmacy) is medication to be sent to?[facility] 3. Do they need a 30 day or 90 day supply? 90 and refills

## 2015-04-16 NOTE — Telephone Encounter (Signed)
Refill sent to the pharmacy electronically.  

## 2015-04-24 ENCOUNTER — Other Ambulatory Visit: Payer: Self-pay | Admitting: Cardiology

## 2015-04-24 NOTE — Telephone Encounter (Signed)
Pt calling stating that her medication Metoprolol 25 mg tablet was sent in wrong. Pt stated that she takes this medication twice a day and that she wanted a 90 day supply sent to [facility]. Please advise

## 2015-04-25 MED ORDER — METOPROLOL TARTRATE 25 MG PO TABS: 25.0000 mg | ORAL_TABLET | Freq: Two times a day (BID) | ORAL | Status: DC

## 2015-04-25 NOTE — Telephone Encounter (Signed)
Rx(s) sent to pharmacy electronically.  

## 2015-05-20 ENCOUNTER — Other Ambulatory Visit: Payer: Self-pay | Admitting: Cardiology

## 2015-05-21 NOTE — Telephone Encounter (Signed)
REFILL 

## 2015-06-05 ENCOUNTER — Ambulatory Visit: Payer: BC Managed Care – PPO | Admitting: Cardiology

## 2015-07-04 ENCOUNTER — Ambulatory Visit: Payer: BC Managed Care – PPO | Admitting: Cardiology

## 2015-07-23 ENCOUNTER — Ambulatory Visit: Payer: BC Managed Care – PPO | Admitting: Cardiology

## 2015-08-21 ENCOUNTER — Ambulatory Visit (INDEPENDENT_AMBULATORY_CARE_PROVIDER_SITE_OTHER): Payer: BC Managed Care – PPO | Admitting: Cardiology

## 2015-08-21 ENCOUNTER — Encounter: Payer: Self-pay | Admitting: Cardiology

## 2015-08-21 VITALS — BP 167/80 | HR 73 | Ht 67.0 in | Wt 235.0 lb

## 2015-08-21 DIAGNOSIS — I2119 ST elevation (STEMI) myocardial infarction involving other coronary artery of inferior wall: Secondary | ICD-10-CM | POA: Diagnosis not present

## 2015-08-21 DIAGNOSIS — I2510 Atherosclerotic heart disease of native coronary artery without angina pectoris: Secondary | ICD-10-CM | POA: Diagnosis not present

## 2015-08-21 DIAGNOSIS — E119 Type 2 diabetes mellitus without complications: Secondary | ICD-10-CM

## 2015-08-21 DIAGNOSIS — E669 Obesity, unspecified: Secondary | ICD-10-CM

## 2015-08-21 DIAGNOSIS — I10 Essential (primary) hypertension: Secondary | ICD-10-CM | POA: Diagnosis not present

## 2015-08-21 DIAGNOSIS — E785 Hyperlipidemia, unspecified: Secondary | ICD-10-CM

## 2015-08-21 DIAGNOSIS — E1169 Type 2 diabetes mellitus with other specified complication: Secondary | ICD-10-CM

## 2015-08-21 DIAGNOSIS — Z955 Presence of coronary angioplasty implant and graft: Secondary | ICD-10-CM

## 2015-08-21 DIAGNOSIS — Z9861 Coronary angioplasty status: Secondary | ICD-10-CM

## 2015-08-21 DIAGNOSIS — D509 Iron deficiency anemia, unspecified: Secondary | ICD-10-CM

## 2015-08-21 NOTE — Patient Instructions (Signed)
NO CHANGES WITH CURRENT MEDICATIONS  Your physician wants you to follow-up in 12 months with Dr Ellyn Hack. You will receive a reminder letter in the mail two months in advance. If you don't receive a letter, please call our office to schedule the follow-up appointment.  If you need a refill on your cardiac medications before your next appointment, please call your pharmacy.

## 2015-08-21 NOTE — Progress Notes (Signed)
PCP: Sheela Stack, MD  Clinic Note: Chief Complaint  Patient presents with  . Coronary Artery Disease    Inferior STEMI with PCI to RCA in June 2015  . Follow-up    31MONTH; Pt states no Sx.    HPI: Tammy Good is a 65 y.o. female with a PMH below who presents today for delayed six-month follow-up of CAD-PCI from STEMI. She had an Inferior STEMI in June 2015 with an occluded RCA. There is significant thrombus noted leading to occlusion of the posterior AV groove branch of the RCA post PCI to RCA. This was partially resolved with PTCA of the distal lesion. She had preserved EF of 50-55% on echo with inferior hypokinesis. Grade 1 diastolic dysfunction.  Tammy Good was last seen on 12/10/2014. She was noting several noncardiac issues including a case of shingles. She is also noted to be anemic and started on iron supplementation after having Infusion x 2.  Remains on supplement.  Recent Hospitalizations: None  Studies Reviewed: none Stepped off side of curb & onto a tree limb that flipped up & hit her foot - 3 way Fxr.  Interval History: She is doing relatively well from a cardiac standpoint, but due to recent foot fracture, she has not been able to exercise like she had been.   She had been riding a stationary bike.  Also tries to walk, but has trouble with her L leg fatigue & back pain. Has had an episode of near syncope a few months ago -- before the foot fracture, (not as frequent as when noted to be anemic) -- no LOC, just shaky.    Cardiac Review of Symptoms: ~ Negative. No chest pain or shortness of breath with rest or exertion. No PND, orthopnea or edema. No palpitations, lightheadedness, dizziness, weakness or syncope/near syncope. No TIA/amaurosis fugax symptoms. No melena, hematochezia, hematuria, or epstaxis. No claudication.  ROS: A comprehensive was performed. Review of Systems  Constitutional: Negative for fever and chills.  HENT: Negative for  congestion and nosebleeds.   Respiratory: Negative for cough and wheezing.   Cardiovascular: Negative for chest pain, palpitations, orthopnea, leg swelling and PND.  Gastrointestinal: Negative for blood in stool and melena.  Genitourinary: Negative for hematuria.  Musculoskeletal: Positive for back pain (has had back injections for back pain & radiculopathy) and joint pain (R foot fxr - now in soft boot after 8 weeks of hard boot.). Negative for myalgias and falls.  Neurological: Positive for tingling (R leg from groin to lower legg on occasion.). Negative for headaches.  Endo/Heme/Allergies: Bruises/bleeds easily (much better after stopping ASA).  All other systems reviewed and are negative.   Past Medical History  Diagnosis Date  . ST elevation myocardial infarction (STEMI) of inferior wall, subsequent episode of care ([place]) 08/2013    80% branch of D1, 40% mid AV groove circumflex, 100% RCA with subacute thrombus -- thrombus extending into RPA V with 100% occlusion after initial angioplasty of mid RCA ;; Post MI ECHO 6/9/'15: EF 50-55%, mild LVH with moderate HK of inferior wall, Gr1 DD, mild LA dilation; mildly reduced RV function  . CAD S/P percutaneous coronary angioplasty 08/2013    100% mRCA - PCI Integrity Resolute DES 3.0 mm x 38 mm - 3.35 mm; PTCA of RPA V 2.0 mm x 15 mm  . Status post insertion of drug eluting coronary artery stent to RCA emergently 09/16/2013  . Diabetes mellitus type 2 in obese (HCC)     On  insulin and Invokana  . Mild essential hypertension   . Hypothyroidism (acquired)   . Panhypopituitarism ([place])   . Hyperlipidemia with target LDL less than 70   . Obesity (BMI 30-39.9) 11/17/2013    Past Surgical History  Procedure Laterality Date  . Pituitary surgery    . Colonoscopy N/A 08/31/2013    Procedure: COLONOSCOPY;  Surgeon: Juanita Craver, MD;  Location: [facility] ENDOSCOPY;  Service: Endoscopy;  Laterality: N/A;  . Esophagogastroduodenoscopy N/A 09/01/2013    Procedure:  ESOPHAGOGASTRODUODENOSCOPY (EGD);  Surgeon: Beryle Beams, MD;  Location: [facility] ENDOSCOPY;  Service: Endoscopy;  Laterality: N/A;  bedside  . Cardiac event monitor  July-August 2015    Sinus rhythm with PVCs  . Left heart catheterization with coronary angiogram N/A 08/30/2013    Procedure: LEFT HEART CATHETERIZATION WITH CORONARY ANGIOGRAM;  Surgeon: Leonie Man, MD;  Location: [facility] CATH LAB: 100% mRCA (thrombus - extends to RPAV), 80% D1, 40% AVG Cx.  [name] Percutaneous coronary stent intervention (pci-s)  08/30/2013    Procedure: PERCUTANEOUS CORONARY STENT INTERVENTION (PCI-S);  Surgeon: Leonie Man, MD;  Location: [facility] CATH LAB;  Integrity Resolute DES 2.0 mm x 38 mm -- 3.35 mm.; PTCA of proximal RPA V. - 2.0 mm x 15 mm balloon  . Transthoracic echocardiogram  08/30/2013    mild LVH. EF 50-55%. Moderate HK of the entire inferior myocardium. GR 1 DD. Mild LA dilation. Mildly reduced RV function    Prior to Admission medications   Medication Sig Start Date End Date Taking? Authorizing Provider  aspirin 81 MG chewable tablet Chew 1 tablet (81 mg total) by mouth daily. 09/03/13  Yes Cherene Altes, MD  atorvastatin (LIPITOR) 40 MG tablet Take 1 tablet (40 mg total) by mouth daily at 6 PM. 09/03/13  Yes Cherene Altes, MD  dexamethasone (DECADRON) 0.5 MG tablet Take 0.5 mg by mouth daily.  09/03/13  Yes Cherene Altes, MD  FARXIGA 10 MG TABS tablet  07/25/15  Yes Historical Provider, MD  Insulin Glargine [facility]) 100 UNIT/ML SOPN  07/11/15  Yes Historical Provider, MD  insulin lispro (HUMALOG) 100 UNIT/ML injection Inject 4-10 Units into the skin See admin instructions. Sliding scale   Yes Historical Provider, MD  INVOKANA 300 MG TABS Take 300 mg by mouth daily.  08/23/13  Yes Historical Provider, MD  iron polysaccharides (FERREX 150) 150 MG capsule Take 150 mg by mouth daily.   Yes Historical Provider, MD  iron polysaccharides (NU-IRON) 150 MG capsule Take 150 mg by mouth daily.   Yes  Historical Provider, MD  levothyroxine (SYNTHROID, LEVOTHROID) 175 MCG tablet Take 175 mcg by mouth daily before breakfast.   Yes Historical Provider, MD  lisinopril (PRINIVIL,ZESTRIL) 20 MG tablet Take 1/2 tablet by mouth daily (start on 11/17/13) 09/03/13  Yes Cherene Altes, MD  metoprolol tartrate (LOPRESSOR) 25 MG tablet TAKE 1 TABLET TWICE A DAY 05/21/15  Yes Leonie Man, MD  pantoprazole (PROTONIX) 40 MG tablet Take 1 tablet (40 mg total) by mouth 2 (two) times daily before a meal. 09/03/13  Yes Cherene Altes, MD  ticagrelor (BRILINTA) 90 MG TABS tablet Take 1 tablet (90 mg total) by mouth 2 (two) times daily. 09/03/13  Yes Cherene Altes, MD     Allergies  Allergen Reactions  . Strawberry Extract Itching and Swelling    Social History   Social History  . Marital Status: Widowed    Spouse Name: N/A  . Number of Children: N/A  .  Years of Education: N/A   Social History Main Topics  . Smoking status: Former Research scientist (life sciences)  . Smokeless tobacco: None  . Alcohol Use: No  . Drug Use: No  . Sexual Activity: Not Asked   Other Topics Concern  . None   Social History Narrative   Widow. Works at [employer].   Former smoker.   Currently in cardiac rehabilitation doing exercise and dietary modification.    family history includes Alzheimer's disease in her maternal grandmother; Cancer in her sister; Cancer (age of onset: 8) in her mother; Heart attack (age of onset: 24) in her father.   Wt Readings from Last 3 Encounters:  08/21/15 235 lb (106.595 kg)  12/08/14 228 lb (103.42 kg)  08/31/14 234 lb (106.142 kg)    PHYSICAL EXAM BP 167/80 mmHg  Pulse 73  Ht 5\' 7"  (1.702 m)  Wt 235 lb (106.595 kg)  BMI 36.80 kg/m2  -- At home her blood pressure runs in the 120s-130s over 80s. She did not take her blood pressure pills this morning General appearance: alert, cooperative, appears stated age, no distress, moderately obese and Pleasant mood and affect. HEENT: [place]/AT,  EOMI, MMM, anicteric sclera Neck: no adenopathy, no carotid bruit and Unable to assess JVP due to body habitus Lungs: clear to auscultation bilaterally, normal percussion bilaterally and Nonlabored, good air movement Heart: RRR, normal S1 and S2. Somewhat distant due to body habitus. No obvious M./R./G. Abdomen: soft, non-tender; bowel sounds normal; no masses, no organomegaly and Obese Extremities: extremities normal, atraumatic, no cyanosis or edema and Right thigh has at least one tender knot/nodule that appears to be a lipoma. It is somewhat tender and painful. Pulses: 2+ and symmetric Skin: Skin color, texture, turgor normal. No rashes or lesions or  Neurologic: Grossly normal    Adult ECG Report  Rate: 73 ;  Rhythm: normal sinus rhythm; Left Axis Deviation (-86), Low Voltage.  Inferior & Anterior Infarct - age undetermined.  Narrative Interpretation: Stable EKG.    Other studies Reviewed: Additional studies/ records that were reviewed today include:  Recent Labs:  Checked by PCP ~ 4 months ago - due in ~1-2 month   ASSESSMENT / PLAN: Problem List Items Addressed This Visit    Status post insertion of drug eluting coronary artery stent to [facility] emergently and PTCA to prox. PLA (Chronic)   Relevant Orders   EKG 12-Lead   ST elevation myocardial infarction (STEMI) of inferior wall, subsequent episode of care ([place]) - Primary (Chronic)    She is doing quite well now with no active heart failure or angina symptoms. She is staying very active with exception of over the last several weeks since her foot fracture. She had lost some weight which she has now gained back. She has exertional dyspnea more related to deconditioning and anemia. Relook echocardiogram post MI had preserved ejection fraction      Relevant Orders   EKG 12-Lead   Obesity (BMI 30-39.9) (Chronic)    The patient understands the need to lose weight with diet and exercise. We have discussed specific strategies for  this.      Relevant Medications   FARXIGA 10 MG TABS tablet   Insulin Glargine (BASAGLAR KWIKPEN) 100 UNIT/ML SOPN   Other Relevant Orders   EKG 12-Lead   Mild essential hypertension (Chronic)    Blood pressure looks high today, but he she is not taking her medication at this point. Will need to follow this closely between myself  and her PCP. As I will be seeing her as frequently, I would defer management to PCP. She is only on 10 mg of lisinopril which could be increased.      Relevant Orders   EKG 12-Lead   Iron deficiency anemia    On standing iron supplementation now. No longer having infusions.      Relevant Orders   EKG 12-Lead   Hyperlipidemia LDL goal <70 (Chronic)    Labs are being monitored by PCP. She is taking 40 mg atorvastatin without significant myalgias. I don't have recent labs.      Relevant Orders   EKG 12-Lead   Diabetes mellitus type 2 in obese (HCC) (Chronic)    On insulin and Farxiga.   Managed by PCP.      Relevant Medications   FARXIGA 10 MG TABS tablet   Insulin Glargine (BASAGLAR KWIKPEN) 100 UNIT/ML SOPN   CAD S/P percutaneous coronary angioplasty (Chronic)    Long DES stent in mid RCA with downstream PTCA - no recurrent anginal symptoms.. Would continue Brilinta, but okay to stop aspirin. He is on statin, beta blocker and ACE inhibitor.      Relevant Orders   EKG 12-Lead      Current medicines are reviewed at length with the patient today. (+/- concerns) none The following changes have been made: none   Studies Ordered:   Orders Placed This Encounter  Procedures  . EKG 12-Lead    ROV 1 year   Glenetta Hew, M.D., M.S. Interventional Cardiologist   Pager # [phone] Phone # [phone] [address] [place], [place] [address]

## 2015-08-22 ENCOUNTER — Encounter: Payer: Self-pay | Admitting: Cardiology

## 2015-08-22 NOTE — Assessment & Plan Note (Addendum)
Long DES stent in mid RCA with downstream PTCA - no recurrent anginal symptoms.. Would continue Brilinta, but okay to stop aspirin. He is on statin, beta blocker and ACE inhibitor.

## 2015-08-22 NOTE — Assessment & Plan Note (Addendum)
On insulin and Farxiga.   Managed by PCP.

## 2015-08-22 NOTE — Assessment & Plan Note (Signed)
Blood pressure looks high today, but he she is not taking her medication at this point. Will need to follow this closely between myself and her PCP. As I will be seeing her as frequently, I would defer management to PCP. She is only on 10 mg of lisinopril which could be increased.

## 2015-08-22 NOTE — Assessment & Plan Note (Signed)
On standing iron supplementation now. No longer having infusions.

## 2015-08-22 NOTE — Assessment & Plan Note (Signed)
She is doing quite well now with no active heart failure or angina symptoms. She is staying very active with exception of over the last several weeks since her foot fracture. She had lost some weight which she has now gained back. She has exertional dyspnea more related to deconditioning and anemia. Relook echocardiogram post MI had preserved ejection fraction

## 2015-08-22 NOTE — Assessment & Plan Note (Signed)
Labs are being monitored by PCP. She is taking 40 mg atorvastatin without significant myalgias. I don't have recent labs.

## 2015-08-22 NOTE — Assessment & Plan Note (Signed)
The patient understands the need to lose weight with diet and exercise. We have discussed specific strategies for this.  

## 2016-01-07 ENCOUNTER — Other Ambulatory Visit: Payer: Self-pay | Admitting: *Deleted

## 2016-01-07 MED ORDER — METOPROLOL TARTRATE 25 MG PO TABS: 25.0000 mg | ORAL_TABLET | Freq: Two times a day (BID) | ORAL | 3 refills | Status: DC

## 2016-01-17 ENCOUNTER — Other Ambulatory Visit: Payer: Self-pay

## 2016-01-17 MED ORDER — METOPROLOL TARTRATE 25 MG PO TABS: 25.0000 mg | ORAL_TABLET | Freq: Two times a day (BID) | ORAL | 3 refills | Status: DC

## 2016-08-13 ENCOUNTER — Ambulatory Visit: Payer: BC Managed Care – PPO | Admitting: Cardiology

## 2016-08-26 ENCOUNTER — Ambulatory Visit (INDEPENDENT_AMBULATORY_CARE_PROVIDER_SITE_OTHER): Payer: BC Managed Care – PPO | Admitting: Cardiology

## 2016-08-26 ENCOUNTER — Encounter: Payer: Self-pay | Admitting: Cardiology

## 2016-08-26 VITALS — BP 152/67 | HR 61 | Ht 67.0 in | Wt 242.0 lb

## 2016-08-26 DIAGNOSIS — Z9861 Coronary angioplasty status: Secondary | ICD-10-CM | POA: Diagnosis not present

## 2016-08-26 DIAGNOSIS — I2119 ST elevation (STEMI) myocardial infarction involving other coronary artery of inferior wall: Secondary | ICD-10-CM | POA: Diagnosis not present

## 2016-08-26 DIAGNOSIS — E785 Hyperlipidemia, unspecified: Secondary | ICD-10-CM | POA: Diagnosis not present

## 2016-08-26 DIAGNOSIS — I10 Essential (primary) hypertension: Secondary | ICD-10-CM | POA: Diagnosis not present

## 2016-08-26 DIAGNOSIS — I493 Ventricular premature depolarization: Secondary | ICD-10-CM

## 2016-08-26 DIAGNOSIS — I2510 Atherosclerotic heart disease of native coronary artery without angina pectoris: Secondary | ICD-10-CM | POA: Diagnosis not present

## 2016-08-26 DIAGNOSIS — E669 Obesity, unspecified: Secondary | ICD-10-CM

## 2016-08-26 MED ORDER — TICAGRELOR 60 MG PO TABS: 60.0000 mg | ORAL_TABLET | Freq: Two times a day (BID) | ORAL | 11 refills | Status: DC

## 2016-08-26 NOTE — Patient Instructions (Signed)
Medication changes  - decrease Brilinta to 60 mg one tablet twice  A day.  New prescription sent to pharmacy     No other changes      Your physician wants you to follow-up in 12 months with Dr Ellyn Hack.You will receive a reminder letter in the mail two months in advance. If you don't receive a letter, please call our office to schedule the follow-up appointment.     If you need a refill on your cardiac medications before your next appointment, please call your pharmacy.

## 2016-08-26 NOTE — Progress Notes (Signed)
PCP: [name], MD  Clinic Note: Chief Complaint  Patient presents with  . Follow-up    12 month;  [name] Coronary Artery Disease    Status post inferior STEMI in June 2015    HPI: Tammy Good is a 66 y.o. female with a PMH below who presents today for annual follow-up with a history of the inferior STEMI with PCI to the RCA in June 2015. In the setting of her MRI, she had an occluded RCA (Integrity Resolute DES 3.0 mm x 38 mm -- postdilated to 3.35 mm) with significant thrombus burden. Preserved EF of 55% with inferior hypokinesis and grade 1 diastolic dysfunction  Tammy Good was last seen on 08/21/2015 - she had just suffered a fall with foot fracture and therefore is not able to walk as much. Otherwise was asymptomatic from a cardiac standpoint.  Recent Hospitalizations: n/a  Studies Personally Reviewed - (if available, images/films reviewed: From Epic Chart or Care Everywhere)  n/a  Interval History: Tammy Good presents here today overall doing fairly well from a cardiac standpoint. She denies any resting or exertional chest tightness/pressure or dyspnea. She has some exertional dyspnea from obesity, but no change from her usual. She has minimal puffy swelling in her feet, but nothing significant. Her blood pressure is elevated today, but she has not taken her medications yet because she was in a hurry to leave this morning. They've usually been relatively well-controlled by her PCPs visits every 3 months. Her exercise has been limited by her persistent back pain with right leg radiculopathy. She is hoping to try to figure out how to get into the water aerobics class, but her insurance company is not covering it. As such, she isn't having a hard time trying to lose weight because she can't exercise. She denies any PND or orthopnea. No rapid irregular heartbeats or palpitations.  No lightheadedness, dizziness, weakness or syncope/near syncope. No TIA/amaurosis fugax  symptoms. No melena, hematochezia, hematuria, or epstaxis. No claudication.  ROS: A comprehensive was performed. Review of Systems  Constitutional: Negative for malaise/fatigue and weight loss.  HENT: Negative for congestion and nosebleeds.   Respiratory: Negative for cough, shortness of breath and wheezing.   Musculoskeletal: Positive for back pain and joint pain (Right hip).  Neurological:       Radicular pain going down the right leg  Endo/Heme/Allergies: Negative for environmental allergies.  Psychiatric/Behavioral: Negative for depression and memory loss. The patient is not nervous/anxious and does not have insomnia.   All other systems reviewed and are negative.  I have reviewed and (if needed) personally updated the patient's problem list, medications, allergies, past medical and surgical history, social and family history.   Past Medical History:  Diagnosis Date  . CAD S/P percutaneous coronary angioplasty 08/2013   100% mRCA - PCI Integrity Resolute DES 3.0 mm x 38 mm - 3.35 mm; PTCA of RPA V 2.0 mm x 15 mm  . Diabetes mellitus type 2 in obese (HCC)    On insulin and Invokana  . Hyperlipidemia with target LDL less than 70   . Hypothyroidism (acquired)   . Mild essential hypertension   . Obesity (BMI 30-39.9) 11/17/2013  . Panhypopituitarism ([place])   . ST elevation myocardial infarction (STEMI) of inferior wall, subsequent episode of care ([place]) 08/2013   80% branch of D1, 40% mid AV groove circumflex, 100% RCA with subacute thrombus -- thrombus extending into RPA V with 100% occlusion after initial angioplasty of mid RCA ;;  Post MI ECHO 6/9/'15: EF 50-55%, mild LVH with moderate HK of inferior wall, Gr1 DD, mild LA dilation; mildly reduced RV function  . Status post insertion of drug eluting coronary artery stent to RCA emergently 09/16/2013    Past Surgical History:  Procedure Laterality Date  . Cardiac Event Monitor  July-August 2015   Sinus rhythm with PVCs  . COLONOSCOPY  N/A 08/31/2013   Procedure: COLONOSCOPY;  Surgeon: Juanita Craver, MD;  Location: [facility] ENDOSCOPY;  Service: Endoscopy;  Laterality: N/A;  . ESOPHAGOGASTRODUODENOSCOPY N/A 09/01/2013   Procedure: ESOPHAGOGASTRODUODENOSCOPY (EGD);  Surgeon: Beryle Beams, MD;  Location: [facility] ENDOSCOPY;  Service: Endoscopy;  Laterality: N/A;  bedside  . LEFT HEART CATHETERIZATION WITH CORONARY ANGIOGRAM N/A 08/30/2013   Procedure: LEFT HEART CATHETERIZATION WITH CORONARY ANGIOGRAM;  Surgeon: Leonie Man, MD;  Location: [facility] CATH LAB: 100% mRCA (thrombus - extends to RPAV), 80% D1, 40% AVG Cx.  [name] PERCUTANEOUS CORONARY STENT INTERVENTION (PCI-S)  08/30/2013   Procedure: PERCUTANEOUS CORONARY STENT INTERVENTION (PCI-S);  Surgeon: Leonie Man, MD;  Location: [facility] CATH LAB;  Integrity Resolute DES 2.0 mm x 38 mm -- 3.35 mm.; PTCA of proximal RPA V. - 2.0 mm x 15 mm balloon  . PITUITARY SURGERY    . TRANSTHORACIC ECHOCARDIOGRAM  08/30/2013   mild LVH. EF 50-55%. Moderate HK of the entire inferior myocardium. GR 1 DD. Mild LA dilation. Mildly reduced RV function    Current Meds  Medication Sig  . atorvastatin (LIPITOR) 40 MG tablet Take 1 tablet (40 mg total) by mouth daily at 6 PM.  . dexamethasone (DECADRON) 0.5 MG tablet Take 0.5 mg by mouth daily.   [name] FARXIGA 10 MG TABS tablet   . insulin aspart (NOVOLOG FLEXPEN) 100 UNIT/ML FlexPen Inject into the skin 3 (three) times daily with meals. Inject 4-10 units into the skin.  . Insulin Glargine (BASAGLAR KWIKPEN) 100 UNIT/ML SOPN   . INVOKANA 300 MG TABS Take 300 mg by mouth daily.   . iron polysaccharides (FERREX 150) 150 MG capsule Take 150 mg by mouth daily.  [name] levothyroxine (SYNTHROID, LEVOTHROID) 175 MCG tablet Take 175 mcg by mouth daily before breakfast.  . lisinopril (PRINIVIL,ZESTRIL) 20 MG tablet Take 1/2 tablet by mouth daily (start on 11/17/13)  . metoprolol tartrate (LOPRESSOR) 25 MG tablet Take 1 tablet (25 mg total) by mouth 2 (two) times daily.  . pantoprazole  (PROTONIX) 40 MG tablet Take 1 tablet (40 mg total) by mouth 2 (two) times daily before a meal.  . [DISCONTINUED] insulin lispro (HUMALOG) 100 UNIT/ML injection Inject 4-10 Units into the skin See admin instructions. Sliding scale  . [DISCONTINUED] ticagrelor (BRILINTA) 90 MG TABS tablet Take 1 tablet (90 mg total) by mouth 2 (two) times daily.    Allergies  Allergen Reactions  . Strawberry Extract Itching and Swelling    Social History   Social History  . Marital status: Widowed    Spouse name: N/A  . Number of children: N/A  . Years of education: N/A   Social History Main Topics  . Smoking status: Former Research scientist (life sciences)  . Smokeless tobacco: Never Used  . Alcohol use No  . Drug use: No  . Sexual activity: Not Asked   Other Topics Concern  . None   Social History Narrative   Widow. Works at [employer].   Former smoker.   Currently in cardiac rehabilitation doing exercise and dietary modification.    family history includes Alzheimer's disease in her  maternal grandmother; Cancer in her sister; Cancer (age of onset: 58) in her mother; Heart attack (age of onset: 74) in her father.  Wt Readings from Last 3 Encounters:  08/26/16 242 lb (109.8 kg)  08/21/15 235 lb (106.6 kg)  12/08/14 228 lb (103.4 kg)  - back & R leg pain -- limits walking  PHYSICAL EXAM BP (!) 152/67   Pulse 61   Ht 5\' 7"  (1.702 m)   Wt 242 lb (109.8 kg)   BMI 37.90 kg/m  General appearance: alert, cooperative, appears stated age, no distress. Moderate to morbidly obese. HEENT: [place]/AT, EOMI, MMM, anicteric sclera Neck: no adenopathy, no carotid bruit and no JVD Lungs: clear to auscultation bilaterally, normal percussion bilaterally and non-labored Heart: regular rate and rhythm, S1 &S2 normal, no murmur, click, rub or gallop; nondisplaced PMI. Distant heart sounds due to body habitus Abdomen: soft, non-tender; bowel sounds normal; no masses,  no organomegaly; no HJR Extremities: extremities normal,  atraumatic, no cyanosis, and edema -trivial Pulses: 2+ and symmetric;  Skin: mobility and turgor normal, no evidence of bleeding or bruising, no lesions noted, temperature normal and texture normal or  Neurologic: Mental status: Alert & oriented x 3, thought content appropriate; non-focal exam.  Pleasant mood & affect.    Adult ECG Report  Rate: 61 ;  Rhythm: normal sinus rhythm and low voltage.  Anterolateral & Inferolateral MI - age undetermined.  Left axis deviation (-86);   Narrative Interpretation: stable    Other studies Reviewed: Additional studies/ records that were reviewed today include:  Recent Labs:  Checked by PCP ; followed every 3 months along with diabetes control. No results found for: CHOL, HDL, LDLCALC, LDLDIRECT, TRIG, CHOLHDL   ASSESSMENT / PLAN: Problem List Items Addressed This Visit    CAD S/P percutaneous coronary angioplasty - Primary (Chronic)    1 long DES stent in the RCA with an area PTCA in the posterior AV groove ranch. No recurrent anginal symptoms. No longer on aspirin. She is now 3 years out from her MI. She would prefer to stay on Brilinta, but we will decrease the dose to 60 mg. She is on statin, ACE inhibitor and beta blocker and stable doses.      Relevant Orders   EKG 12-Lead (Completed)   Hyperlipidemia LDL goal <70 (Chronic)    Continued goal of LDL less than 70. Her PCP is following her labs I don't have these results. She is on moderate dose atorvastatin without any significant myalgias. If unable to achieve LDL less than 70, would need to consider either adding Zetia versus initiation of PCSK9 inhibitor. New target range for patients with ACS history is about ready to be reduced to 50 for LDL.      Mild essential hypertension (Chronic)    Blood pressure again was high today, but she has not taken her medications. She says at home and with her BCPs evaluations are usually in the 120-130 mmHg range. She will continue to follow-up with PCP,  but low threshold for tartrate lisinopril to 20 mg      Relevant Orders   EKG 12-Lead (Completed)   Obesity (BMI 30-39.9) (Chronic)    She needs to lose weight. I talked about the importance of water aerobics or symptom of exercise that she can do immature back would not bother her. She may need to have some evaluation of her back pain. It would be okay for her to hold Brilinta if necessary for procedures. Discussed dietary modification  as well.      Relevant Medications   insulin aspart (NOVOLOG FLEXPEN) 100 UNIT/ML FlexPen   PVC's (premature ventricular contractions)    Completely asymptomatic at this point on low-dose beta blocker.      ST elevation myocardial infarction (STEMI) of inferior wall, subsequent episode of care [facility]) (Chronic)    She has not had another echocardiogram since her MI, but her EF was relatively well-preserved. I suspect that some of the inferior wall motion abnormality would've recovered. No recurrent angina or heart failure symptoms. Exertional dyspnea due to obesity, deconditioning, but access more limited by musculoskeletal back pain with radicular radiation.      Relevant Orders   EKG 12-Lead (Completed)      Current medicines are reviewed at length with the patient today. (+/- concerns) None The following changes have been made: None  Patient Instructions  Medication changes  - decrease Brilinta to 60 mg one tablet twice  A day.  New prescription sent to pharmacy     No other changes      Your physician wants you to follow-up in 12 months with Dr Ellyn Hack.You will receive a reminder letter in the mail two months in advance. If you don't receive a letter, please call our office to schedule the follow-up appointment.     If you need a refill on your cardiac medications before your next appointment, please call your pharmacy.    Studies Ordered:   Orders Placed This Encounter  Procedures  . EKG 12-Lead      Glenetta Hew,  M.D., M.S. Interventional Cardiologist   Pager # [phone] Phone # [phone] [address] [facility] [place], [place] [address]

## 2016-08-28 ENCOUNTER — Encounter: Payer: Self-pay | Admitting: Cardiology

## 2016-08-28 NOTE — Assessment & Plan Note (Signed)
Completely asymptomatic at this point on low-dose beta blocker.

## 2016-08-28 NOTE — Assessment & Plan Note (Signed)
She needs to lose weight. I talked about the importance of water aerobics or symptom of exercise that she can do immature back would not bother her. She may need to have some evaluation of her back pain. It would be okay for her to hold Brilinta if necessary for procedures. Discussed dietary modification as well.

## 2016-08-28 NOTE — Assessment & Plan Note (Signed)
She has not had another echocardiogram since her MI, but her EF was relatively well-preserved. I suspect that some of the inferior wall motion abnormality would've recovered. No recurrent angina or heart failure symptoms. Exertional dyspnea due to obesity, deconditioning, but access more limited by musculoskeletal back pain with radicular radiation.

## 2016-08-28 NOTE — Assessment & Plan Note (Addendum)
1 long DES stent in the RCA with an area PTCA in the posterior AV groove ranch. No recurrent anginal symptoms. No longer on aspirin. She is now 3 years out from her MI. She would prefer to stay on Brilinta, but we will decrease the dose to 60 mg. She is on statin, ACE inhibitor and beta blocker and stable doses.

## 2016-08-28 NOTE — Assessment & Plan Note (Signed)
Blood pressure again was high today, but she has not taken her medications. She says at home and with her BCPs evaluations are usually in the 120-130 mmHg range. She will continue to follow-up with PCP, but low threshold for tartrate lisinopril to 20 mg

## 2016-08-28 NOTE — Assessment & Plan Note (Signed)
Continued goal of LDL less than 70. Her PCP is following her labs I don't have these results. She is on moderate dose atorvastatin without any significant myalgias. If unable to achieve LDL less than 70, would need to consider either adding Zetia versus initiation of PCSK9 inhibitor. New target range for patients with ACS history is about ready to be reduced to 50 for LDL.

## 2017-02-04 ENCOUNTER — Other Ambulatory Visit: Payer: Self-pay | Admitting: Cardiology

## 2017-03-05 DIAGNOSIS — Z794 Long term (current) use of insulin: Secondary | ICD-10-CM | POA: Insufficient documentation

## 2017-07-02 ENCOUNTER — Telehealth (INDEPENDENT_AMBULATORY_CARE_PROVIDER_SITE_OTHER): Payer: Self-pay | Admitting: Physical Medicine and Rehabilitation

## 2017-07-02 NOTE — Telephone Encounter (Signed)
Patient called asking for a CD with all her back rays on it. CB # [phone]

## 2017-07-06 NOTE — Telephone Encounter (Signed)
IC patient and advised cd ready for pickup at front desk, LMVM.

## 2017-07-22 ENCOUNTER — Ambulatory Visit (INDEPENDENT_AMBULATORY_CARE_PROVIDER_SITE_OTHER): Payer: BC Managed Care – PPO | Admitting: Podiatry

## 2017-07-22 ENCOUNTER — Encounter: Payer: Self-pay | Admitting: Podiatry

## 2017-07-22 VITALS — BP 113/50 | HR 73

## 2017-07-22 DIAGNOSIS — M79674 Pain in right toe(s): Secondary | ICD-10-CM

## 2017-07-22 DIAGNOSIS — M79675 Pain in left toe(s): Secondary | ICD-10-CM | POA: Diagnosis not present

## 2017-07-22 DIAGNOSIS — Q828 Other specified congenital malformations of skin: Secondary | ICD-10-CM | POA: Diagnosis not present

## 2017-07-22 DIAGNOSIS — B351 Tinea unguium: Secondary | ICD-10-CM

## 2017-07-23 ENCOUNTER — Encounter: Payer: Self-pay | Admitting: Podiatry

## 2017-07-23 NOTE — Progress Notes (Signed)
This patient presents to the office with chief complaint of long thick nails and diabetic feet.    This patient says he has long thick painful nails.  These nails are painful walking and wearing shoes.  He has no history of infection or drainage from both feet.  This patient presents the office today for treatment of the  long nails and a foot evaluation due to history of  Diabetes. She also relates having painful callus on bottom of both feet.  General Appearance  Alert, conversant and in no acute stress.  Vascular  Dorsalis pedis and posterior tibial  pulses are palpable  bilaterally.  Capillary return is within normal limits  bilaterally. Temperature is within normal limits  bilaterally.  Neurologic  Senn-Weinstein monofilament wire test within normal limits  bilaterally. Muscle power within normal limits bilaterally.  Nails Thick disfigured discolored nails with subungual debris  from hallux to fifth toes bilaterally. No evidence of bacterial infection or drainage bilaterally.  Orthopedic  No limitations of motion of motion feet .  No crepitus or effusions noted.  No bony pathology or digital deformities noted.  Skin  normotropic skin  noted bilaterally.  No signs of infections or ulcers noted.   P sub 5th metabase left foot.orokeratosis sub 5th met head and  Onychomycosis  Diabetes with no foot complications  Porokeratosis left foot.  IE  Debride nails x 10.  A diabetic foot exam was performed and there is no evidence of any vascular or neurologic pathology.  Debridement of porokeratosis left foot.  RTC 3 months.   Gardiner Barefoot DPM

## 2017-08-05 ENCOUNTER — Ambulatory Visit (INDEPENDENT_AMBULATORY_CARE_PROVIDER_SITE_OTHER): Payer: BC Managed Care – PPO | Admitting: Orthotics

## 2017-08-05 DIAGNOSIS — Q828 Other specified congenital malformations of skin: Secondary | ICD-10-CM | POA: Diagnosis not present

## 2017-08-05 DIAGNOSIS — M79673 Pain in unspecified foot: Secondary | ICD-10-CM | POA: Diagnosis not present

## 2017-08-05 NOTE — Progress Notes (Signed)
Patient came into today to be cast for Custom Foot Orthotics. Upon recommendation of Dr. Prudence Davidson Patient presents with painful keratoma Left 5th met head Goals are forefoot cushioning and offloading Plan vendor [facility]

## 2017-08-27 ENCOUNTER — Ambulatory Visit: Payer: BC Managed Care – PPO | Admitting: Orthotics

## 2017-08-27 DIAGNOSIS — Q828 Other specified congenital malformations of skin: Secondary | ICD-10-CM

## 2017-08-27 NOTE — Progress Notes (Signed)
Patient came in today to pick up custom made foot orthotics.  The goals were accomplished and the patient reported no dissatisfaction with said orthotics.  Patient was advised of breakin period and how to report any issues. 

## 2017-09-17 ENCOUNTER — Other Ambulatory Visit: Payer: Self-pay | Admitting: Cardiology

## 2017-09-21 ENCOUNTER — Other Ambulatory Visit: Payer: Self-pay | Admitting: Cardiology

## 2017-09-22 ENCOUNTER — Telehealth: Payer: Self-pay | Admitting: *Deleted

## 2017-09-22 MED ORDER — TICAGRELOR 60 MG PO TABS: 60.0000 mg | ORAL_TABLET | Freq: Two times a day (BID) | ORAL | 2 refills | Status: DC

## 2017-09-22 NOTE — Telephone Encounter (Signed)
LEFT MESSAGE TO PATIENT -- NEEDS APPOINTMENT.

## 2017-09-22 NOTE — Telephone Encounter (Signed)
Left message for patient to call an schedule an appointment to continue with refill of brilinta. Available opening are now into SEPT 2019

## 2017-09-23 ENCOUNTER — Telehealth: Payer: Self-pay | Admitting: Cardiology

## 2017-09-23 MED ORDER — TICAGRELOR 60 MG PO TABS: 60.0000 mg | ORAL_TABLET | Freq: Two times a day (BID) | ORAL | 0 refills | Status: DC

## 2017-09-23 NOTE — Telephone Encounter (Signed)
NO YOU SHOULD NOT MISS A DOSE IF POSSIBLE . THERE ARE 1 WEEK SAMPLES AVAILABLE FOR PICK UP.  PATIENT STATES SHE CAN COME BY OFFICE TODAY.   PATIENT NEEDS SET UP FOLLOW UP APPOINTMENT

## 2017-09-23 NOTE — Telephone Encounter (Signed)
New Message   Pt c/o medication issue:  1. Name of Medication: ticagrelor (BRILINTA) 60 MG TABS tablet  2. How are you currently taking this medication (dosage and times per day)? Take 1 tablet (60 mg total) by mouth 2 (two) times daily  3. Are you having a reaction (difficulty breathing--STAT)? no  4. What is your medication issue? Pt states that her pharmacy ran our her this medication and will not have it available until tomorrow and the pt wants to know if she will be ok missing 2-3 doses. Please call

## 2017-09-28 ENCOUNTER — Other Ambulatory Visit (INDEPENDENT_AMBULATORY_CARE_PROVIDER_SITE_OTHER): Payer: Self-pay | Admitting: Otolaryngology

## 2017-10-01 ENCOUNTER — Other Ambulatory Visit (INDEPENDENT_AMBULATORY_CARE_PROVIDER_SITE_OTHER): Payer: Self-pay | Admitting: Otolaryngology

## 2017-10-01 DIAGNOSIS — H6041 Cholesteatoma of right external ear: Secondary | ICD-10-CM

## 2017-10-07 ENCOUNTER — Ambulatory Visit
Admission: RE | Admit: 2017-10-07 | Discharge: 2017-10-07 | Disposition: A | Discharge status: Home / Self Care | Payer: BC Managed Care – PPO | Source: Ambulatory Visit | Attending: Otolaryngology | Admitting: Otolaryngology

## 2017-10-07 DIAGNOSIS — H6041 Cholesteatoma of right external ear: Secondary | ICD-10-CM

## 2017-10-28 ENCOUNTER — Encounter: Payer: Self-pay | Admitting: Podiatry

## 2017-10-28 ENCOUNTER — Ambulatory Visit: Payer: BC Managed Care – PPO | Admitting: Podiatry

## 2017-10-28 DIAGNOSIS — E119 Type 2 diabetes mellitus without complications: Secondary | ICD-10-CM | POA: Diagnosis not present

## 2017-10-28 DIAGNOSIS — M79675 Pain in left toe(s): Secondary | ICD-10-CM

## 2017-10-28 DIAGNOSIS — M79674 Pain in right toe(s): Secondary | ICD-10-CM | POA: Diagnosis not present

## 2017-10-28 DIAGNOSIS — B351 Tinea unguium: Secondary | ICD-10-CM

## 2017-10-28 NOTE — Progress Notes (Signed)
Complaint:  Visit Type: Patient returns to my office for continued preventative foot care services. Complaint: Patient states" my nails have grown long and thick and become painful to walk and wear shoes" Patient has been diagnosed with DM with no foot complications. The patient presents for preventative foot care services. No changes to ROS  Podiatric Exam: Vascular: dorsalis pedis and posterior tibial pulses are palpable bilateral. Capillary return is immediate. Temperature gradient is WNL. Skin turgor WNL  Sensorium: Normal Semmes Weinstein monofilament test. Normal tactile sensation bilaterally. Nail Exam: Pt has thick disfigured discolored nails with subungual debris noted bilateral entire nail hallux through fifth toenails Ulcer Exam: There is no evidence of ulcer or pre-ulcerative changes or infection. Orthopedic Exam: Muscle tone and strength are WNL. No limitations in general ROM. No crepitus or effusions noted. Foot type and digits show no abnormalities. Bony prominences are unremarkable. Skin: No Porokeratosis. No infection or ulcers  Diagnosis:  Onychomycosis, , Pain in right toe, pain in left toes  Treatment & Plan Procedures and Treatment: Consent by patient was obtained for treatment procedures.   Debridement of mycotic and hypertrophic toenails, 1 through 5 bilateral and clearing of subungual debris. No ulceration, no infection noted.  Return Visit-Office Procedure: Patient instructed to return to the office for a follow up visit 3 months for continued evaluation and treatment.    Tammy Good DPM 

## 2017-11-30 ENCOUNTER — Ambulatory Visit: Payer: BC Managed Care – PPO | Admitting: Cardiology

## 2017-11-30 VITALS — BP 118/58 | HR 64 | Ht 67.0 in | Wt 234.0 lb

## 2017-11-30 DIAGNOSIS — I2119 ST elevation (STEMI) myocardial infarction involving other coronary artery of inferior wall: Secondary | ICD-10-CM | POA: Diagnosis not present

## 2017-11-30 DIAGNOSIS — Z1211 Encounter for screening for malignant neoplasm of colon: Secondary | ICD-10-CM | POA: Insufficient documentation

## 2017-11-30 DIAGNOSIS — Z955 Presence of coronary angioplasty implant and graft: Secondary | ICD-10-CM

## 2017-11-30 DIAGNOSIS — I10 Essential (primary) hypertension: Secondary | ICD-10-CM

## 2017-11-30 DIAGNOSIS — E785 Hyperlipidemia, unspecified: Secondary | ICD-10-CM | POA: Diagnosis not present

## 2017-11-30 DIAGNOSIS — Z01810 Encounter for preprocedural cardiovascular examination: Secondary | ICD-10-CM | POA: Diagnosis not present

## 2017-11-30 DIAGNOSIS — Z9861 Coronary angioplasty status: Secondary | ICD-10-CM

## 2017-11-30 DIAGNOSIS — I2510 Atherosclerotic heart disease of native coronary artery without angina pectoris: Secondary | ICD-10-CM

## 2017-11-30 MED ORDER — TICAGRELOR 60 MG PO TABS: 60.0000 mg | ORAL_TABLET | Freq: Two times a day (BID) | ORAL | 6 refills | Status: DC

## 2017-11-30 MED ORDER — TICAGRELOR 60 MG PO TABS: 60.0000 mg | ORAL_TABLET | Freq: Two times a day (BID) | ORAL | 0 refills | Status: DC

## 2017-11-30 NOTE — Progress Notes (Signed)
PCP: [name], MD  Clinic Note: Chief Complaint  Patient presents with  . Follow-up    Over 1 year.  Stable  . Coronary Artery Disease    No angina  . Pre-op Exam    Ear surgery    HPI: Tammy Good is a 67 y.o. female with a PMH notable for CAD-PCI who presents today for delayed annual f/u -as well as for preop evaluation for ear surgery.  Inferior STEMI with PCI to the RCA in June 2015. -- occluded RCA (Integrity Resolute DES 3.0 mm x 38 mm -- postdilated to 3.35 mm) with significant thrombus burden. Preserved EF of 55% with inferior hypokinesis and grade 1 diastolic dysfunction  Tammy Good was last seen in June 2018 -> was doing quite well.  No chest pain with rest or exertion.  Some mild exertional dyspnea because of obesity.  Was having blood pressure medication titrated but was more stable.  Recent Hospitalizations: None  Studies Personally Reviewed - (if available, images/films reviewed: From Epic Chart or Care Everywhere)  None  Interval History: Tammy Good presents here today for delayed follow-up feeling quite well.  She needs to have some type of surgery done on her ear because of hearing issues and is here for preop evaluation.  She denies any resting or exertional chest tightness or pressure.  She is trying to get into doing water aerobics, but otherwise is not really all that active.  She is somewhat deconditioned and will probably get short of breath if she does any moderate amount of exertion, but is not having any angina or heart failure symptoms.  No PND, orthopnea with only mild ankle/foot swelling but not true edema.  No palpitations, lightheadedness, dizziness, weakness or syncope/near syncope. No TIA/amaurosis fugax symptoms. No melena, hematochezia, hematuria, or epstaxis. No claudication -- What she notes is that ever since her heart catheterization she has right thigh pain with walking.  (Sounds radicular)  ROS: A comprehensive was  performed. Review of Systems  Constitutional: Negative for malaise/fatigue and weight loss.  HENT: Negative for congestion and nosebleeds.   Respiratory: Negative for cough, shortness of breath and wheezing.   Gastrointestinal: Negative for abdominal pain, blood in stool and melena.  Genitourinary: Negative for hematuria.  Musculoskeletal: Negative for joint pain (Only mild arthralgia pains.).       Right thigh/leg pain with walking -- post-cath  Neurological: Positive for tingling (Somewhat tingling pain in the right thigh with walking.  Sounds somewhat radicular.). Negative for focal weakness.  Psychiatric/Behavioral: The patient is not nervous/anxious.   All other systems reviewed and are negative.  I have reviewed and (if needed) personally updated the patient's problem list, medications, allergies, past medical and surgical history, social and family history.   Past Medical History:  Diagnosis Date  . CAD S/P percutaneous coronary angioplasty 08/2013   100% mRCA - PCI Integrity Resolute DES 3.0 mm x 38 mm - 3.35 mm; PTCA of RPA V 2.0 mm x 15 mm  . Diabetes mellitus type 2 in obese (HCC)    On insulin and Invokana  . Hyperlipidemia with target LDL less than 70   . Hypothyroidism (acquired)   . Mild essential hypertension   . Obesity (BMI 30-39.9) 11/17/2013  . Panhypopituitarism ([place])   . ST elevation myocardial infarction (STEMI) of inferior wall, subsequent episode of care ([place]) 08/2013   80% branch of D1, 40% mid AV groove circumflex, 100% RCA with subacute thrombus -- thrombus extending into RPA  V with 100% occlusion after initial angioplasty of mid RCA ;; Post MI ECHO 6/9/'15: EF 50-55%, mild LVH with moderate HK of inferior wall, Gr1 DD, mild LA dilation; mildly reduced RV function  . Status post insertion of drug eluting coronary artery stent to RCA emergently 09/16/2013    Past Surgical History:  Procedure Laterality Date  . Cardiac Event Monitor  July-August 2015   Sinus  rhythm with PVCs  . COLONOSCOPY N/A 08/31/2013   Procedure: COLONOSCOPY;  Surgeon: Juanita Craver, MD;  Location: [facility] ENDOSCOPY;  Service: Endoscopy;  Laterality: N/A;  . ESOPHAGOGASTRODUODENOSCOPY N/A 09/01/2013   Procedure: ESOPHAGOGASTRODUODENOSCOPY (EGD);  Surgeon: Beryle Beams, MD;  Location: [facility] ENDOSCOPY;  Service: Endoscopy;  Laterality: N/A;  bedside  . LEFT HEART CATHETERIZATION WITH CORONARY ANGIOGRAM N/A 08/30/2013   Procedure: LEFT HEART CATHETERIZATION WITH CORONARY ANGIOGRAM;  Surgeon: Leonie Man, MD;  Location: [facility] CATH LAB: 100% mRCA (thrombus - extends to RPAV), 80% D1, 40% AVG Cx.  Tammy Good PERCUTANEOUS CORONARY STENT INTERVENTION (PCI-S)  08/30/2013   Procedure: PERCUTANEOUS CORONARY STENT INTERVENTION (PCI-S);  Surgeon: Leonie Man, MD;  Location: [facility] CATH LAB;  Integrity Resolute DES 2.0 mm x 38 mm -- 3.35 mm.; PTCA of proximal RPA V. - 2.0 mm x 15 mm balloon  . PITUITARY SURGERY    . TRANSTHORACIC ECHOCARDIOGRAM  08/30/2013   mild LVH. EF 50-55%. Moderate HK of the entire inferior myocardium. GR 1 DD. Mild LA dilation. Mildly reduced RV function    Current Meds  Medication Sig  . atorvastatin (LIPITOR) 40 MG tablet Take 1 tablet (40 mg total) by mouth daily at 6 PM.  . dexamethasone (DECADRON) 0.5 MG tablet Take 0.5 mg by mouth daily.   Tammy Good FARXIGA 10 MG TABS tablet   . insulin aspart (NOVOLOG FLEXPEN) 100 UNIT/ML FlexPen Inject into the skin 3 (three) times daily with meals. Sliding scale  . Insulin Glargine (BASAGLAR KWIKPEN) 100 UNIT/ML SOPN Inject 52 Units into the skin at bedtime. inject 52 units into skin at bedtime  . iron polysaccharides (FERREX 150) 150 MG capsule Take 150 mg by mouth daily.  Tammy Good levothyroxine (SYNTHROID, LEVOTHROID) 175 MCG tablet Take 175 mcg by mouth daily before breakfast.  . lisinopril (PRINIVIL,ZESTRIL) 20 MG tablet Take 1/2 tablet by mouth daily (start on 11/17/13)  . metoprolol tartrate (LOPRESSOR) 25 MG tablet TAKE 1 TABLET TWICE A DAY  .  pantoprazole (PROTONIX) 40 MG tablet Take 1 tablet (40 mg total) by mouth 2 (two) times daily before a meal.  . ticagrelor (BRILINTA) 60 MG TABS tablet Take 1 tablet (60 mg total) by mouth 2 (two) times daily.  . [DISCONTINUED] ticagrelor (BRILINTA) 60 MG TABS tablet Take 1 tablet (60 mg total) by mouth 2 (two) times daily.  . [DISCONTINUED] ticagrelor (BRILINTA) 60 MG TABS tablet Take 1 tablet (60 mg total) by mouth 2 (two) times daily.    Allergies  Allergen Reactions  . Strawberry Extract Itching and Swelling    Social History   Tobacco Use  . Smoking status: Former Research scientist (life sciences)  . Smokeless tobacco: Never Used  Substance Use Topics  . Alcohol use: No  . Drug use: No   Social History   Social History Narrative   Widow. Works at [employer].   Former smoker.   Overall not very active.  Hoping to get into water aerobics class.    family history includes Alzheimer's disease in her maternal grandmother; Cancer in her sister; Cancer (age of  onset: 104) in her mother; Heart attack (age of onset: 19) in her father.  Wt Readings from Last 3 Encounters:  11/30/17 234 lb (106.1 kg)  08/26/16 242 lb (109.8 kg)  08/21/15 235 lb (106.6 kg)    PHYSICAL EXAM BP (!) 118/58   Pulse 64   Ht 5\' 7"  (1.702 m)   Wt 234 lb (106.1 kg)   BMI 36.65 kg/m  Physical Exam  Constitutional: She is oriented to person, place, and time. She appears well-developed. No distress.  Obese.  Well-groomed  HENT:  Head: Normocephalic and atraumatic.  Neck: Normal range of motion. Neck supple. No hepatojugular reflux and no JVD present. Carotid bruit is not present.  Cardiovascular: Normal rate and regular rhythm.  No extrasystoles are present. PMI is not displaced (Unable to palpate due to obesity). Exam reveals distant heart sounds and decreased pulses (Decreased pedal pulses due to swelling and obesity). Exam reveals no gallop and no friction rub.  No murmur heard. Pulmonary/Chest: Effort normal and  breath sounds normal. No respiratory distress. She has no wheezes. She has no rales.  Abdominal: Soft. Bowel sounds are normal. She exhibits no distension. There is no tenderness.  Obese; no HSM palpable  Musculoskeletal: Normal range of motion. She exhibits no edema (Trivial ankle and foot swelling).  Neurological: She is alert and oriented to person, place, and time.  Psychiatric: She has a normal mood and affect. Her behavior is normal. Judgment and thought content normal.  Vitals reviewed.    Adult ECG Report  Rate: 64;  Rhythm: normal sinus rhythm; leftward axis.  Otherwise normal intervals and durations.  Inferior MI, age undetermined.  Cannot exclude anterior MI.    Narrative Interpretation: Stable EKG  Other studies Reviewed: Additional studies/ records that were reviewed today include:  Recent Labs: (11/10/2017) -TC 96, TG 78, HDL 34, LDL 46.  A1c 7.4.  BUN/creatinine 9/0.7.   ASSESSMENT / PLAN: Problem List Items Addressed This Visit    CAD S/P percutaneous coronary angioplasty (Chronic)    Long stent in the RCA back in 2015.  No further anginal symptoms.  She continues to be on full dose Brilinta twice daily. Plan: Continue Brilinta until 5 days prior to her ear surgery.  At that time she can hold it for surgery and then would simply not restart. Restart aspirin 81 mg postop Continue statin, beta-blocker and ACE inhibitor. No nitro requirement.      Hyperlipidemia LDL goal <70 (Chronic)    Lipid panel looks excellent on current dose of atorvastatin. At this point would continue current meds.  Hopefully with weight loss will improve even further.      Mild essential hypertension (Chronic)    Blood pressure well controlled today on moderate dose lisinopril and low-dose metoprolol.      Pre-operative cardiovascular examination - Primary    Low risk surgery in a patient with no active anginal symptoms.  She has diabetes, but not on insulin.  No heart failure symptoms.  No  angina symptoms.  No prior TIA or CVA.  LOW RISK patient for LOW RISK surgery. No further cardiac evaluation required.  Okay to hold Brilinta preop.  Would not restart Brilinta postop, simply restart aspirin 81 mg daily.      Relevant Orders   EKG 12-Lead   ST elevation myocardial infarction (STEMI) of inferior wall, subsequent episode of care ([place]) (Chronic)    History of inferior STEMI with occluded RCA treated with DES stent: Noted on EKG.  The  inferior wall does appear to be hypokinetic on follow-up echocardiogram.  But no heart failure symptoms and preserved EF overall.      Status post insertion of drug eluting coronary artery stent to [facility] emergently and PTCA to prox. PLA (Chronic)      I spent a total of 30 minutes with the patient and chart review. >  50% of the time was spent in direct patient consultation.   Current medicines are reviewed at length with the patient today.  (+/- concerns) Pre-op  The following changes have been made:  see below  Patient Instructions  MEDICATION CHANGES  NO CHANGES AT PRESENT TIME   OK FOR EAR SURGERY FROM A CARDIAC STANDPOINT,  WHEN SURGERY DATE IS GIVEN , YOU MAY STOP BRILINTA 5 DAYS PRIOR TO SURGERY.  DO NOT RESTART AFTER SURGERY ,START ASPIRIN 81 MG  DAILY.    Your physician wants you to follow-up in [place].You will receive a reminder letter in the mail two months in advance. If you don't receive a letter, please call our office to schedule the follow-up appointment.   If you need a refill on your cardiac medications before your next appointment, please call your pharmacy.     Studies Ordered:   Orders Placed This Encounter  Procedures  . EKG 12-Lead      Glenetta Hew, M.D., M.S. Interventional Cardiologist   Pager # [phone] Phone # [phone] [address] [place], [place] [address]   Thank you for choosing [facility] at [facility]!!

## 2017-11-30 NOTE — Patient Instructions (Signed)
MEDICATION CHANGES  NO CHANGES AT PRESENT TIME   OK FOR EAR SURGERY FROM A CARDIAC STANDPOINT,  WHEN SURGERY DATE IS GIVEN , YOU MAY STOP BRILINTA 5 DAYS PRIOR TO SURGERY.  DO NOT RESTART AFTER SURGERY ,START ASPIRIN 81 MG  DAILY.    Your physician wants you to follow-up in [place].You will receive a reminder letter in the mail two months in advance. If you don't receive a letter, please call our office to schedule the follow-up appointment.   If you need a refill on your cardiac medications before your next appointment, please call your pharmacy.

## 2017-12-01 ENCOUNTER — Encounter: Payer: Self-pay | Admitting: Cardiology

## 2017-12-01 NOTE — Assessment & Plan Note (Signed)
Lipid panel looks excellent on current dose of atorvastatin. At this point would continue current meds.  Hopefully with weight loss will improve even further.

## 2017-12-01 NOTE — Assessment & Plan Note (Signed)
Blood pressure well controlled today on moderate dose lisinopril and low-dose metoprolol.

## 2017-12-01 NOTE — Assessment & Plan Note (Signed)
Low risk surgery in a patient with no active anginal symptoms.  She has diabetes, but not on insulin.  No heart failure symptoms.  No angina symptoms.  No prior TIA or CVA.  LOW RISK patient for LOW RISK surgery. No further cardiac evaluation required.  Okay to hold Brilinta preop.  Would not restart Brilinta postop, simply restart aspirin 81 mg daily.

## 2017-12-01 NOTE — Assessment & Plan Note (Addendum)
History of inferior STEMI with occluded RCA treated with DES stent: Noted on EKG.  The inferior wall does appear to be hypokinetic on follow-up echocardiogram.  But no heart failure symptoms and preserved EF overall.

## 2017-12-01 NOTE — Assessment & Plan Note (Signed)
Long stent in the RCA back in 2015.  No further anginal symptoms.  She continues to be on full dose Brilinta twice daily. Plan: Continue Brilinta until 5 days prior to her ear surgery.  At that time she can hold it for surgery and then would simply not restart. Restart aspirin 81 mg postop Continue statin, beta-blocker and ACE inhibitor. No nitro requirement.

## 2017-12-02 ENCOUNTER — Other Ambulatory Visit: Payer: Self-pay | Admitting: Otolaryngology

## 2017-12-21 ENCOUNTER — Encounter (HOSPITAL_BASED_OUTPATIENT_CLINIC_OR_DEPARTMENT_OTHER): Payer: Self-pay | Admitting: *Deleted

## 2017-12-21 ENCOUNTER — Other Ambulatory Visit: Payer: Self-pay

## 2017-12-21 ENCOUNTER — Encounter (HOSPITAL_BASED_OUTPATIENT_CLINIC_OR_DEPARTMENT_OTHER)
Admission: RE | Admit: 2017-12-21 | Discharge: 2017-12-21 | Disposition: A | Discharge status: Home / Self Care | Payer: BC Managed Care – PPO | Source: Ambulatory Visit | Attending: Otolaryngology | Admitting: Otolaryngology

## 2017-12-21 DIAGNOSIS — Z01812 Encounter for preprocedural laboratory examination: Secondary | ICD-10-CM | POA: Diagnosis present

## 2017-12-21 NOTE — Progress Notes (Signed)
Patient's chart and recent cardiology notes reviewed with Dr Royce Macadamia, [place] for [facility].

## 2017-12-22 DIAGNOSIS — Z01812 Encounter for preprocedural laboratory examination: Secondary | ICD-10-CM | POA: Diagnosis not present

## 2017-12-22 LAB — BASIC METABOLIC PANEL
Anion gap: 8 (ref 5–15)
BUN: 11 mg/dL (ref 8–23)
CO2: 26 mmol/L (ref 22–32)
Calcium: 8.3 mg/dL — ABNORMAL LOW (ref 8.9–10.3)
Chloride: 107 mmol/L (ref 98–111)
Creatinine, Ser: 0.69 mg/dL (ref 0.44–1.00)
GFR calc Af Amer: >60 mL/min (ref >60)
GFR calc non Af Amer: >60 mL/min (ref >60)
Glucose, Bld: 131 mg/dL — ABNORMAL HIGH (ref 70–99)
Potassium: 3.5 mmol/L (ref 3.5–5.1)
Sodium: 141 mmol/L (ref 135–145)

## 2017-12-28 ENCOUNTER — Encounter (HOSPITAL_BASED_OUTPATIENT_CLINIC_OR_DEPARTMENT_OTHER)
Admission: RE | Disposition: A | Discharge status: Home / Self Care | Payer: Self-pay | Source: Ambulatory Visit | Attending: Otolaryngology

## 2017-12-28 ENCOUNTER — Other Ambulatory Visit: Payer: Self-pay

## 2017-12-28 ENCOUNTER — Ambulatory Visit (HOSPITAL_BASED_OUTPATIENT_CLINIC_OR_DEPARTMENT_OTHER): Payer: BC Managed Care – PPO | Admitting: Anesthesiology

## 2017-12-28 ENCOUNTER — Ambulatory Visit (HOSPITAL_BASED_OUTPATIENT_CLINIC_OR_DEPARTMENT_OTHER)
Admission: RE | Admit: 2017-12-28 | Discharge: 2017-12-28 | Disposition: A | Discharge status: Home / Self Care | Payer: BC Managed Care – PPO | Source: Ambulatory Visit | Attending: Otolaryngology | Admitting: Otolaryngology

## 2017-12-28 ENCOUNTER — Encounter (HOSPITAL_BASED_OUTPATIENT_CLINIC_OR_DEPARTMENT_OTHER): Payer: Self-pay | Admitting: *Deleted

## 2017-12-28 DIAGNOSIS — K219 Gastro-esophageal reflux disease without esophagitis: Secondary | ICD-10-CM | POA: Diagnosis not present

## 2017-12-28 DIAGNOSIS — H7011 Chronic mastoiditis, right ear: Secondary | ICD-10-CM | POA: Diagnosis not present

## 2017-12-28 DIAGNOSIS — I252 Old myocardial infarction: Secondary | ICD-10-CM | POA: Insufficient documentation

## 2017-12-28 DIAGNOSIS — Z79899 Other long term (current) drug therapy: Secondary | ICD-10-CM | POA: Diagnosis not present

## 2017-12-28 DIAGNOSIS — I2510 Atherosclerotic heart disease of native coronary artery without angina pectoris: Secondary | ICD-10-CM | POA: Insufficient documentation

## 2017-12-28 DIAGNOSIS — H7121 Cholesteatoma of mastoid, right ear: Secondary | ICD-10-CM | POA: Insufficient documentation

## 2017-12-28 DIAGNOSIS — H7191 Unspecified cholesteatoma, right ear: Secondary | ICD-10-CM | POA: Insufficient documentation

## 2017-12-28 DIAGNOSIS — H905 Unspecified sensorineural hearing loss: Secondary | ICD-10-CM | POA: Diagnosis not present

## 2017-12-28 DIAGNOSIS — Z87891 Personal history of nicotine dependence: Secondary | ICD-10-CM | POA: Diagnosis not present

## 2017-12-28 DIAGNOSIS — Z794 Long term (current) use of insulin: Secondary | ICD-10-CM | POA: Insufficient documentation

## 2017-12-28 DIAGNOSIS — I10 Essential (primary) hypertension: Secondary | ICD-10-CM | POA: Insufficient documentation

## 2017-12-28 DIAGNOSIS — E119 Type 2 diabetes mellitus without complications: Secondary | ICD-10-CM | POA: Diagnosis not present

## 2017-12-28 HISTORY — PX: TYMPANOMASTOIDECTOMY: SHX34

## 2017-12-28 HISTORY — DX: Unspecified cholesteatoma, unspecified ear: H71.90

## 2017-12-28 LAB — GLUCOSE, CAPILLARY
Glucose-Capillary: 77 mg/dL (ref 70–99)
Glucose-Capillary: 113 mg/dL — ABNORMAL HIGH (ref 70–99)

## 2017-12-28 SURGERY — TYMPANOPLASTY, WITH MASTOIDECTOMY
Anesthesia: General | Site: Ear | Laterality: Right

## 2017-12-28 MED ORDER — FENTANYL CITRATE (PF) 100 MCG/2ML IJ SOLN
INTRAMUSCULAR | Status: AC
  Filled 2017-12-28: qty 2

## 2017-12-28 MED ORDER — LACTATED RINGERS IV SOLN
INTRAVENOUS | Status: DC
  Administered 2017-12-28 (×2): via INTRAVENOUS

## 2017-12-28 MED ORDER — PHENYLEPHRINE 40 MCG/ML (10ML) SYRINGE FOR IV PUSH (FOR BLOOD PRESSURE SUPPORT)
PREFILLED_SYRINGE | INTRAVENOUS | Status: DC | PRN
  Administered 2017-12-28: 40 ug via INTRAVENOUS
  Administered 2017-12-28 (×2): 80 ug via INTRAVENOUS

## 2017-12-28 MED ORDER — FENTANYL CITRATE (PF) 100 MCG/2ML IJ SOLN
50.0000 ug | INTRAMUSCULAR | Status: DC | PRN
  Administered 2017-12-28 (×2): 50 ug via INTRAVENOUS

## 2017-12-28 MED ORDER — MIDAZOLAM HCL 2 MG/2ML IJ SOLN
1.0000 mg | INTRAMUSCULAR | Status: DC | PRN
  Administered 2017-12-28: 2 mg via INTRAVENOUS

## 2017-12-28 MED ORDER — LIDOCAINE 2% (20 MG/ML) 5 ML SYRINGE
INTRAMUSCULAR | Status: DC | PRN
  Administered 2017-12-28: 60 mg via INTRAVENOUS

## 2017-12-28 MED ORDER — ONDANSETRON HCL 4 MG/2ML IJ SOLN
INTRAMUSCULAR | Status: AC
  Filled 2017-12-28: qty 2

## 2017-12-28 MED ORDER — SCOPOLAMINE 1 MG/3DAYS TD PT72: 1.0000 | MEDICATED_PATCH | Freq: Once | TRANSDERMAL | Status: DC | PRN

## 2017-12-28 MED ORDER — OXYCODONE-ACETAMINOPHEN 5-325 MG PO TABS: 1.0000 | ORAL_TABLET | ORAL | 0 refills | Status: DC | PRN

## 2017-12-28 MED ORDER — PROPOFOL 500 MG/50ML IV EMUL
INTRAVENOUS | Status: AC
  Filled 2017-12-28: qty 50

## 2017-12-28 MED ORDER — EPHEDRINE 5 MG/ML INJ
INTRAVENOUS | Status: AC
  Filled 2017-12-28: qty 10

## 2017-12-28 MED ORDER — CEFAZOLIN SODIUM-DEXTROSE 2-3 GM-%(50ML) IV SOLR
INTRAVENOUS | Status: DC | PRN
  Administered 2017-12-28: 2 g via INTRAVENOUS

## 2017-12-28 MED ORDER — DEXAMETHASONE SODIUM PHOSPHATE 10 MG/ML IJ SOLN
INTRAMUSCULAR | Status: AC
  Filled 2017-12-28: qty 1

## 2017-12-28 MED ORDER — SUCCINYLCHOLINE CHLORIDE 200 MG/10ML IV SOSY
PREFILLED_SYRINGE | INTRAVENOUS | Status: AC
  Filled 2017-12-28: qty 10

## 2017-12-28 MED ORDER — PROPOFOL 10 MG/ML IV BOLUS
INTRAVENOUS | Status: DC | PRN
  Administered 2017-12-28: 150 mg via INTRAVENOUS
  Administered 2017-12-28: 50 mg via INTRAVENOUS

## 2017-12-28 MED ORDER — ONDANSETRON HCL 4 MG/2ML IJ SOLN: 4.0000 mg | Freq: Once | INTRAMUSCULAR | Status: DC | PRN

## 2017-12-28 MED ORDER — MIDAZOLAM HCL 2 MG/2ML IJ SOLN
INTRAMUSCULAR | Status: AC
  Filled 2017-12-28: qty 2

## 2017-12-28 MED ORDER — AMOXICILLIN 875 MG PO TABS: 875.0000 mg | ORAL_TABLET | Freq: Two times a day (BID) | ORAL | 0 refills | Status: AC

## 2017-12-28 MED ORDER — LIDOCAINE 2% (20 MG/ML) 5 ML SYRINGE
INTRAMUSCULAR | Status: AC
  Filled 2017-12-28: qty 5

## 2017-12-28 MED ORDER — SUCCINYLCHOLINE CHLORIDE 20 MG/ML IJ SOLN
INTRAMUSCULAR | Status: DC | PRN
  Administered 2017-12-28: 100 mg via INTRAVENOUS

## 2017-12-28 MED ORDER — FENTANYL CITRATE (PF) 100 MCG/2ML IJ SOLN: 25.0000 ug | INTRAMUSCULAR | Status: DC | PRN

## 2017-12-28 MED ORDER — PHENYLEPHRINE 40 MCG/ML (10ML) SYRINGE FOR IV PUSH (FOR BLOOD PRESSURE SUPPORT)
PREFILLED_SYRINGE | INTRAVENOUS | Status: AC
  Filled 2017-12-28: qty 10

## 2017-12-28 MED ORDER — OXYCODONE HCL 5 MG/5ML PO SOLN: 5.0000 mg | Freq: Once | ORAL | Status: DC | PRN

## 2017-12-28 MED ORDER — OXYCODONE HCL 5 MG PO TABS: 5.0000 mg | ORAL_TABLET | Freq: Once | ORAL | Status: DC | PRN

## 2017-12-28 MED ORDER — CIPROFLOXACIN-DEXAMETHASONE 0.3-0.1 % OT SUSP
OTIC | Status: DC | PRN
  Administered 2017-12-28: 75 [drp] via OTIC

## 2017-12-28 MED ORDER — ONDANSETRON HCL 4 MG/2ML IJ SOLN
INTRAMUSCULAR | Status: DC | PRN
  Administered 2017-12-28: 4 mg via INTRAVENOUS

## 2017-12-28 MED ORDER — LIDOCAINE-EPINEPHRINE 1 %-1:100000 IJ SOLN
INTRAMUSCULAR | Status: DC | PRN
  Administered 2017-12-28: 5 mL

## 2017-12-28 MED ORDER — EPHEDRINE SULFATE-NACL 50-0.9 MG/10ML-% IV SOSY
PREFILLED_SYRINGE | INTRAVENOUS | Status: DC | PRN
  Administered 2017-12-28 (×5): 10 mg via INTRAVENOUS

## 2017-12-28 MED ORDER — CIPROFLOXACIN-DEXAMETHASONE 0.3-0.1 % OT SUSP
OTIC | Status: AC
  Filled 2017-12-28: qty 7.5

## 2017-12-28 MED ORDER — DEXAMETHASONE SODIUM PHOSPHATE 4 MG/ML IJ SOLN
INTRAMUSCULAR | Status: DC | PRN
  Administered 2017-12-28: 10 mg via INTRAVENOUS

## 2017-12-28 SURGICAL SUPPLY — 64 items
ADH SKN CLS APL DERMABOND .7 (GAUZE/BANDAGES/DRESSINGS) ×1
BALL CTTN LRG ABS STRL LF (GAUZE/BANDAGES/DRESSINGS) ×1
BLADE CLIPPER SURG (BLADE) ×1 IMPLANT
BLADE NDL 3 SS STRL (BLADE) IMPLANT
BLADE NEEDLE 3 SS STRL (BLADE) IMPLANT
BUR DIAMOND COARSE 3.0 (BURR) ×1 IMPLANT
BUR RND OSTEON ELITE 6.0 (BURR) ×1 IMPLANT
CANISTER SUCT 1200ML W/VALVE (MISCELLANEOUS) ×2 IMPLANT
CORD BIPOLAR FORCEPS 12FT (ELECTRODE) ×1 IMPLANT
COTTONBALL LRG STERILE PKG (GAUZE/BANDAGES/DRESSINGS) ×2 IMPLANT
DECANTER SPIKE VIAL GLASS SM (MISCELLANEOUS) ×2 IMPLANT
DERMABOND ADVANCED (GAUZE/BANDAGES/DRESSINGS) ×1
DERMABOND ADVANCED .7 DNX12 (GAUZE/BANDAGES/DRESSINGS) ×1 IMPLANT
DRAPE MICROSCOPE WILD 40.5X102 (DRAPES) ×2 IMPLANT
DRAPE SURG 17X23 STRL (DRAPES) ×3 IMPLANT
DRAPE SURG IRRIG POUCH 19X23 (DRAPES) ×2 IMPLANT
DRSG GLASSCOCK MASTOID ADT (GAUZE/BANDAGES/DRESSINGS) ×1 IMPLANT
DRSG GLASSCOCK MASTOID PED (GAUZE/BANDAGES/DRESSINGS) IMPLANT
ELECT COATED BLADE 2.86 ST (ELECTRODE) ×2 IMPLANT
ELECT PAIRED SUBDERMAL (MISCELLANEOUS) ×2
ELECT REM PT RETURN 9FT ADLT (ELECTROSURGICAL) ×2
ELECTRODE PAIRED SUBDERMAL (MISCELLANEOUS) ×1 IMPLANT
ELECTRODE REM PT RTRN 9FT ADLT (ELECTROSURGICAL) ×1 IMPLANT
GAUZE SPONGE 4X4 12PLY STRL (GAUZE/BANDAGES/DRESSINGS) IMPLANT
GAUZE SPONGE 4X4 12PLY STRL LF (GAUZE/BANDAGES/DRESSINGS) IMPLANT
GLOVE BIO SURGEON STRL SZ7.5 (GLOVE) ×2 IMPLANT
GLOVE BIOGEL PI IND STRL 7.0 (GLOVE) IMPLANT
GLOVE BIOGEL PI INDICATOR 7.0 (GLOVE) ×2
GLOVE ECLIPSE 6.5 STRL STRAW (GLOVE) ×1 IMPLANT
GOWN STRL REUS W/ TWL LRG LVL3 (GOWN DISPOSABLE) ×2 IMPLANT
GOWN STRL REUS W/TWL LRG LVL3 (GOWN DISPOSABLE) ×4
HEMOSTAT SURGICEL .5X2 ABSORB (HEMOSTASIS) IMPLANT
HEMOSTAT SURGICEL 2X14 (HEMOSTASIS) IMPLANT
IV CATH AUTO 14GX1.75 SAFE ORG (IV SOLUTION) ×2 IMPLANT
IV NS 500ML (IV SOLUTION) ×2
IV NS 500ML BAXH (IV SOLUTION) ×1 IMPLANT
IV SET EXT 30 76VOL 4 MALE LL (IV SETS) ×2 IMPLANT
NDL FILTER BLUNT 18X1 1/2 (NEEDLE) ×1 IMPLANT
NDL HYPO 25X1 1.5 SAFETY (NEEDLE) ×1 IMPLANT
NDL SAFETY ECLIPSE 18X1.5 (NEEDLE) ×1 IMPLANT
NEEDLE FILTER BLUNT 18X 1/2SAF (NEEDLE) ×1
NEEDLE FILTER BLUNT 18X1 1/2 (NEEDLE) ×1 IMPLANT
NEEDLE HYPO 18GX1.5 SHARP (NEEDLE) ×2
NEEDLE HYPO 25X1 1.5 SAFETY (NEEDLE) ×2 IMPLANT
NS IRRIG 1000ML POUR BTL (IV SOLUTION) ×2 IMPLANT
PACK AMBRUS EAR (MISCELLANEOUS) IMPLANT
PACK BASIN DAY SURGERY FS (CUSTOM PROCEDURE TRAY) ×2 IMPLANT
PACK ENT DAY SURGERY (CUSTOM PROCEDURE TRAY) ×2 IMPLANT
PENCIL BUTTON HOLSTER BLD 10FT (ELECTRODE) ×2 IMPLANT
PROBE NERVBE PRASS .33 (MISCELLANEOUS) IMPLANT
SLEEVE SCD COMPRESS KNEE MED (MISCELLANEOUS) ×1 IMPLANT
SPONGE SURGIFOAM ABS GEL 12-7 (HEMOSTASIS) ×2 IMPLANT
SUT BONE WAX W31G (SUTURE) IMPLANT
SUT VIC AB 3-0 SH 27 (SUTURE) ×2
SUT VIC AB 3-0 SH 27X BRD (SUTURE) IMPLANT
SUT VIC AB 4-0 P-3 18XBRD (SUTURE) IMPLANT
SUT VIC AB 4-0 P3 18 (SUTURE)
SUT VICRYL 4-0 PS2 18IN ABS (SUTURE) ×1 IMPLANT
SYR 5ML LL (SYRINGE) ×2 IMPLANT
SYR BULB 3OZ (MISCELLANEOUS) ×2 IMPLANT
TOWEL GREEN STERILE FF (TOWEL DISPOSABLE) ×2 IMPLANT
TOWEL OR NON WOVEN STRL DISP B (DISPOSABLE) ×1 IMPLANT
TRAY DSU PREP LF (CUSTOM PROCEDURE TRAY) ×2 IMPLANT
TUBING IRRIGATION (MISCELLANEOUS) ×2 IMPLANT

## 2017-12-28 NOTE — Transfer of Care (Signed)
Immediate Anesthesia Transfer of Care Note  Patient: Tammy Good  Procedure(s) Performed: RIGHT TYMPANOMASTOIDECTOMY (Right Ear)  Patient Location: PACU  Anesthesia Type:General  Level of Consciousness: awake  Airway & Oxygen Therapy: Patient Spontanous Breathing and Patient connected to face mask oxygen  Post-op Assessment: Report given to RN and Post -op Vital signs reviewed and stable  Post vital signs: Reviewed and stable  Last Vitals:  Vitals Value Taken Time  BP 157/53 12/28/2017 11:34 AM  Temp    Pulse 84 12/28/2017 11:36 AM  Resp 22 12/28/2017 11:36 AM  SpO2 100 % 12/28/2017 11:36 AM  Vitals shown include unvalidated device data.  Last Pain:  Vitals:   12/28/17 0749  TempSrc: Oral  PainSc: 0-No pain         Complications: No apparent anesthesia complications

## 2017-12-28 NOTE — Anesthesia Procedure Notes (Signed)
Procedure Name: Intubation Date/Time: 12/28/2017 9:31 AM Performed by: Lieutenant Diego, CRNA Pre-anesthesia Checklist: Patient identified, Emergency Drugs available, Suction available and Patient being monitored Patient Re-evaluated:Patient Re-evaluated prior to induction Oxygen Delivery Method: Circle system utilized Preoxygenation: Pre-oxygenation with 100% oxygen Induction Type: IV induction Ventilation: Mask ventilation without difficulty Laryngoscope Size: Miller and 2 Grade View: Grade I Tube type: Oral Tube size: 7.0 mm Number of attempts: 1 Airway Equipment and Method: Stylet and Oral airway Placement Confirmation: ETT inserted through vocal cords under direct vision,  positive ETCO2 and breath sounds checked- equal and bilateral Secured at: 24 cm Tube secured with: Tape Dental Injury: Teeth and Oropharynx as per pre-operative assessment

## 2017-12-28 NOTE — Anesthesia Postprocedure Evaluation (Signed)
Anesthesia Post Note  Patient: Tammy Good  Procedure(s) Performed: RIGHT TYMPANOMASTOIDECTOMY (Right Ear)     Patient location during evaluation: PACU Anesthesia Type: General Level of consciousness: awake and alert Pain management: pain level controlled Vital Signs Assessment: post-procedure vital signs reviewed and stable Respiratory status: spontaneous breathing, nonlabored ventilation and respiratory function stable Cardiovascular status: blood pressure returned to baseline and stable Postop Assessment: no apparent nausea or vomiting Anesthetic complications: no    Last Vitals:  Vitals:   12/28/17 1245 12/28/17 1300  BP:  (!) 161/65  Pulse: 79 76  Resp:    Temp:  36.8 C  SpO2: 94% 93%    Last Pain:  Vitals:   12/28/17 1300  TempSrc:   PainSc: 0-No pain                 [name]

## 2017-12-28 NOTE — Op Note (Signed)
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 Right modified radical canal-wall-down tympanomastoidectomy  ANESTHESIA: General endotracheal tube anesthesia.  COMPLICATIONS: None.  ESTIMATED BLOOD LOSS: 189ml  INDICATION FOR PROCEDURE:   Tammy Good is a 67 y.o. female with a history of chronic right ear otomastoiditis and right ear cholesteatoma. She was previously treated with multiple courses of oral and topical antibiotics. However, she continued to have persistent right ear drainage. On examination, she was noted to have right ear cholesteatoma, eroding the right tympanic membrane, posterior ear canal wall, and the mastoid tip. On her CT scan, the middle ear space and mastoid cavities were opacified.  Based on the above findings, the decision was made for the patient to undergo the above-stated procedure. The risks, benefits, alternatives, and details of the procedures were discussed with the patient. Questions were invited and answered. Informed consent was obtained.  DESCRIPTION OF PROCEDURE: The patient was taken to the operating room and placed supine on the operating table. General endotracheal tube anesthesia was induced by the anesthesiologist.The patient was positioned and prepped and draped in a standard fashion for right ear surgery. Facial nerve monitoring electrodes were placed. The facial nerve monitoring system was functional throughout the case.  1% lidocaine with 1-100,000 epinephrine was infiltrated into the right postauricular crease and into all 4 quadrants of the ear canal. Under the operating microscope, the right ear canal was cleaned of all cerumen. The posterior wall of the ear canal was eroded by the cholesteatoma.  A  standard tympanomeatal flap was elevated in a standard fashion. The malleus and incus were partially eroded.  A standard postauricular incision was made. The soft tissue covering the mastoid cortex was carefully elevated. A 2 x 2 centimeters temporalis fascia graft was harvested in the standard fashion. Using a #5 cutting bur, a standard cortical mastoidectomy procedure was performed. The tegmen and sigmoid sinus were identified and preserved. The posterior canal wall was taken down to the level of the facial nerve. The antrum was entered. The partially eroded malleus and incus were removed. The stapes was noted to be intact and mobile. All visible cholesteatoma was removed. The mastoid and middle ear space were copiously irrigated. The previously harvested temporalis fascia graft was used to reconstruct a new tympanum. Gelfoam soaked with otovel solution was used to pack the middle ear space and the mastoid cavity.  Attention was then focused on the meatoplasty portion of the case. A 6:00 and 12:00 incision was made. The posterior concha cartilage was removed. The posterior soft tissue flap was then sutured to the posterior aspect of the mastoid cavity. A large meatal opening was achieved.  The postauricular incision was then closed in layers with 4-0 Vicryl and Dermabond. A Glasscock dressing was applied. That concluded the procedure for the patient. The care of the patient was turned over to the anesthesiologist. The patient was awakened from anesthesia without difficulty. He was extubated and transferred to the recovery room in good condition.  OPERATIVE FINDINGS: Right middle ear and mastoid cholesteatoma, eroding the scutum, malleus, and incus. The stapes was noted to be intact and mobile.  SPECIMEN: Right middle ear and mastoid contents  FOLLOWUP CARE: The patient will be discharged home once she is awake and alert. She will follow up in my office in 1  week.

## 2017-12-28 NOTE — Discharge Instructions (Addendum)
°Post Anesthesia Home Care Instructions ° °Activity: °Get plenty of rest for the remainder of the day. A responsible individual must stay with you for 24 hours following the procedure.  °For the next 24 hours, DO NOT: °-Drive a car °-Operate machinery °-Drink alcoholic beverages °-Take any medication unless instructed by your physician °-Make any legal decisions or sign important papers. ° °Meals: °Start with liquid foods such as gelatin or soup. Progress to regular foods as tolerated. Avoid greasy, spicy, heavy foods. If nausea and/or vomiting occur, drink only clear liquids until the nausea and/or vomiting subsides. Call your physician if vomiting continues. ° °Special Instructions/Symptoms: °Your throat may feel dry or sore from the anesthesia or the breathing tube placed in your throat during surgery. If this causes discomfort, gargle with warm salt water. The discomfort should disappear within 24 hours. ° °If you had a scopolamine patch placed behind your ear for the management of post- operative nausea and/or vomiting: ° °1. The medication in the patch is effective for 72 hours, after which it should be removed.  Wrap patch in a tissue and discard in the trash. Wash hands thoroughly with soap and water. °2. You may remove the patch earlier than 72 hours if you experience unpleasant side effects which may include dry mouth, dizziness or visual disturbances. °3. Avoid touching the patch. Wash your hands with soap and water after contact with the patch. °   ° °--------------------- ° °POSTOPERATIVE INSTRUCTIONS FOR PATIENTS HAVING MASTOIDECTOMY SURGERY ° °1. You may have nausea, vomiting, or a low grade fever for a few days after surgery. This is not unusual.  However, if the nausea and vomiting become severe or last more than one day, please call our office. Medication for nausea may be prescribed. You may take Tylenol every four hours for fever. If your fever should rise above 101 F, please contact our  office. °2. Limit your activities for one week. This includes avoiding heavy lifting (over 20lbs), vigorous exercise, and contact sports. °3. Do not blow your nose for approximately one week.  Any accumulation in the nose should be drawn back into the throat and expectorated through the mouth to avoid infecting the ear. If it is necessary to sneeze, do so with your mouth open to decrease pressure to your ears. Do not hold your nose to avoid sneezing.  °4. You may wash your hair 2 days after the operation. Please protect the ear and any external incision from water. We recommend placing some plastic wrap over the ear and incision to help protect against water. It may be necessary to have someone help you during the first several washings.  °5. Try to keep the incision clean and dry. You should clean crust from the incisional area with diluted hydrogen peroxide. Any time you are going to clean your ear, please wash your hands thoroughly prior to starting. °6. Some dull postoperative ear pain is expected. Your physician may prescribe pain medicine to help relieve your discomfort. If your postoperative pain increases and your medication is not helping, please call the office before taking any other medication that we have not prescribed or recommended. °7. If any of the following should occur, contact Dr.Teoh:   (Office: [phone])) °a. Persistent bleeding                                                             °  b. Persistent fever °c. Purulent drainage (pus) from the ear or incision °d. Increasing redness around the suture line °e. Persistent pain or dizziness °f. Facial weakness °8. Sometimes, with a larger incision behind the ear, the incision may open and drain. If it occurs, please contact our office. °9. If your physician prescribes an antibiotic, fill the prescription promptly and take all of the medicine as directed until the entire supply is gone. °10.  You may experience some popping and cracking  sounds in the ear for up to several weeks. It may sound like you are “talking in a barrel” or a tunnel. This is normal and should not cause concern. °11. Because a nerve for taste passes through your ear, it is not unusual for your taste sensation to be altered for several weeks or months. °12. You may experience some numbness in your outer ear, earlobe, and the incision area. This is normal, and most of the numbness will be expected to fade over a period of time.                                                               °13. Your eardrum may look “pink” or “red” for up to a month postoperatively. The red coloration is due to fluid in the middle ear. The change in color should not be confused with infection.  °14. It is important to return to our office for your postoperative appointment as scheduled. If for some reason you were not given a postoperative appointment, please call our office at [phone]. ° °

## 2017-12-28 NOTE — Anesthesia Preprocedure Evaluation (Addendum)
Anesthesia Evaluation  Patient identified by MRN, date of birth, ID band Patient awake    Reviewed: Allergy & Precautions, NPO status , Patient's Chart, lab work & pertinent test results, reviewed documented beta blocker date and time   History of Anesthesia Complications Negative for: history of anesthetic complications  Airway Mallampati: I  TM Distance: >3 FB Neck ROM: Full    Dental no notable dental hx.    Pulmonary neg pulmonary ROS, former smoker,    breath sounds clear to auscultation       Cardiovascular hypertension, Pt. on home beta blockers and Pt. on medications + CAD and + Past MI   Rhythm:Regular Rate:Normal  From Cardiologist:  "Low risk surgery in a patient with no active anginal symptoms. She has diabetes, but not on insulin.  No heart failure symptoms.  No angina symptoms.  No prior TIA or CVA. LOW RISK patient for LOW RISK surgery. No further cardiac evaluation required. Okay to hold Brilinta preop.  Would not restart Brilinta postop, simply restart aspirin 81 mg daily."   Neuro/Psych negative neurological ROS  negative psych ROS   GI/Hepatic Neg liver ROS, GERD  Medicated,  Endo/Other  diabetes, Insulin DependentHypothyroidism   Renal/GU negative Renal ROS  negative genitourinary   Musculoskeletal negative musculoskeletal ROS (+)   Abdominal   Peds  Hematology negative hematology ROS (+)   Anesthesia Other Findings   Reproductive/Obstetrics                            Anesthesia Physical Anesthesia Plan  ASA: III  Anesthesia Plan: General   Post-op Pain Management:    Induction: Intravenous  PONV Risk Score and Plan: 4 or greater and Ondansetron, Dexamethasone, Treatment may vary due to age or medical condition and Propofol infusion  Airway Management Planned: Oral ETT  Additional Equipment: None  Intra-op Plan:   Post-operative Plan: Extubation in  OR  Informed Consent: I have reviewed the patients History and Physical, chart, labs and discussed the procedure including the risks, benefits and alternatives for the proposed anesthesia with the patient or authorized representative who has indicated his/her understanding and acceptance.     Plan Discussed with:   Anesthesia Plan Comments:        Anesthesia Quick Evaluation

## 2017-12-28 NOTE — H&P (Signed)
Cc: Right ear cholesteatoma  HPI: The patient is a 67 year old female who returns today for her follow-up evaluation.  The patient was last seen 1 month ago.  At that time, she was noted to have purulent drainage from the right ear canal.  The right tympanic membrane was noted to be severely retracted, with TM perforation and bony erosion.  She was also noted to have bilateral high frequency sensorineural hearing loss, worse on the right side.  Her findings were concerning for right ear cholesteatoma.  The patient underwent a temporal bone CT scan.  The CT showed opacifications of the right mastoid air cells and the middle ear spaces.  There was significant bony destruction at the right mastoid tip along the posterior wall and roof of the right external auditory canal. The findings were consistent with right ear cholesteatoma.  The patient returns today complaining of persistent right ear drainage.  She denies any significant change in her hearing.  She denies any otalgia or vertigo. No other ENT, GI, or respiratory issue noted since the last visit.   Exam: General: Communicates without difficulty, well nourished, no acute distress. Head: Normocephalic, no evidence injury, no tenderness, facial buttresses intact without stepoff. Eyes: PERRL, EOMI. No scleral icterus, conjunctivae clear. Neuro: CN II exam reveals vision grossly intact.  No nystagmus at any point of gaze. 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  The cholesteatoma appears to involve the right mastoid air cells and middle ear spaces.  The left ear is normal. Nose: External evaluation reveals normal support and skin without lesions.  Dorsum is intact.  Anterior rhinoscopy reveals healthy pink mucosa over anterior aspect of inferior turbinates and intact septum.  No purulence noted. Oral:  Oral cavity and oropharynx are intact, symmetric, without erythema or edema.  Mucosa is moist without lesions. Neck:  Full range of motion without pain.  There is no significant lymphadenopathy.  No masses palpable.  Thyroid bed within normal limits to palpation.  Parotid glands and submandibular glands equal bilaterally without mass.  Trachea is midline. Neuro:  CN 2-12 grossly intact. Gait normal. Vestibular: No nystagmus at any point of gaze. The cerebellar examination is unremarkable.   Assessment  1.  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  The cholesteatoma appears to involve the right mastoid air cells and middle ear spaces.   Plan: 1.  The physical exam findings and the CT images are extensively reviewed with the patient.  2.  The patient should continue the use of Ciprodex eardrops prn drainage.  3.  Based on the above findings, the patient will benefit from undergoing right canal wall down tympanomastoid surgery.  The risks, benefits, and details of the procedure are reviewed with the patient.  Questions are invited and answered.  4.  The patient would like to proceed with the procedure.

## 2017-12-29 ENCOUNTER — Encounter (HOSPITAL_BASED_OUTPATIENT_CLINIC_OR_DEPARTMENT_OTHER): Payer: Self-pay | Admitting: Otolaryngology

## 2018-01-27 ENCOUNTER — Ambulatory Visit: Payer: BC Managed Care – PPO | Admitting: Podiatry

## 2018-03-03 ENCOUNTER — Encounter: Payer: Self-pay | Admitting: Podiatry

## 2018-03-03 ENCOUNTER — Ambulatory Visit (INDEPENDENT_AMBULATORY_CARE_PROVIDER_SITE_OTHER): Payer: BC Managed Care – PPO | Admitting: Podiatry

## 2018-03-03 DIAGNOSIS — E119 Type 2 diabetes mellitus without complications: Secondary | ICD-10-CM | POA: Diagnosis not present

## 2018-03-03 DIAGNOSIS — B351 Tinea unguium: Secondary | ICD-10-CM | POA: Diagnosis not present

## 2018-03-03 DIAGNOSIS — M79675 Pain in left toe(s): Secondary | ICD-10-CM

## 2018-03-03 DIAGNOSIS — M79674 Pain in right toe(s): Secondary | ICD-10-CM | POA: Diagnosis not present

## 2018-03-03 NOTE — Progress Notes (Signed)
Complaint:  Visit Type: Patient returns to my office for continued preventative foot care services. Complaint: Patient states" my nails have grown long and thick and become painful to walk and wear shoes" Patient has been diagnosed with DM with no foot complications. The patient presents for preventative foot care services. No changes to ROS.  Callus left foot is not painful today.  Podiatric Exam: Vascular: dorsalis pedis and posterior tibial pulses are palpable bilateral. Capillary return is immediate. Temperature gradient is WNL. Skin turgor WNL  Sensorium: Normal Semmes Weinstein monofilament test. Normal tactile sensation bilaterally. Nail Exam: Pt has thick disfigured discolored nails with subungual debris noted bilateral entire nail hallux through fifth toenails Ulcer Exam: There is no evidence of ulcer or pre-ulcerative changes or infection. Orthopedic Exam: Muscle tone and strength are WNL. No limitations in general ROM. No crepitus or effusions noted. Foot type and digits show no abnormalities. Bony prominences are unremarkable. Skin:  Porokeratosis sub 5th met left foot. No infection or ulcers  Diagnosis:  Onychomycosis, , Pain in right toe, pain in left toes  Treatment & Plan Procedures and Treatment: Consent by patient was obtained for treatment procedures.   Debridement of mycotic and hypertrophic toenails, 1 through 5 bilateral and clearing of subungual debris. No ulceration, no infection noted.  Return Visit-Office Procedure: Patient instructed to return to the office for a follow up visit 3 months for continued evaluation and treatment.    Gardiner Barefoot DPM

## 2018-06-01 ENCOUNTER — Ambulatory Visit: Payer: Medicare Other | Admitting: Podiatry

## 2018-06-02 ENCOUNTER — Ambulatory Visit: Payer: BC Managed Care – PPO | Admitting: Podiatry

## 2018-06-16 ENCOUNTER — Ambulatory Visit: Payer: Medicare Other | Admitting: Podiatry

## 2018-08-03 ENCOUNTER — Encounter: Payer: Self-pay | Admitting: Podiatry

## 2018-08-03 ENCOUNTER — Other Ambulatory Visit: Payer: Self-pay

## 2018-08-03 ENCOUNTER — Ambulatory Visit: Payer: Medicare Other | Admitting: Podiatry

## 2018-08-03 VITALS — Temp 97.3°F

## 2018-08-03 DIAGNOSIS — M79675 Pain in left toe(s): Secondary | ICD-10-CM

## 2018-08-03 DIAGNOSIS — E119 Type 2 diabetes mellitus without complications: Secondary | ICD-10-CM | POA: Diagnosis not present

## 2018-08-03 DIAGNOSIS — B351 Tinea unguium: Secondary | ICD-10-CM | POA: Diagnosis not present

## 2018-08-03 DIAGNOSIS — Q828 Other specified congenital malformations of skin: Secondary | ICD-10-CM | POA: Diagnosis not present

## 2018-08-03 DIAGNOSIS — M79674 Pain in right toe(s): Secondary | ICD-10-CM

## 2018-08-03 NOTE — Progress Notes (Signed)
Complaint:  Visit Type: Patient returns to my office for continued preventative foot care services. Complaint: Patient states" my nails have grown long and thick and become painful to walk and wear shoes" Patient has been diagnosed with DM with no foot complications. The patient presents for preventative foot care services. No changes to ROS.  Callus left foot is not painful today.  Podiatric Exam: Vascular: dorsalis pedis and posterior tibial pulses are palpable bilateral. Capillary return is immediate. Temperature gradient is WNL. Skin turgor WNL  Sensorium: Normal Semmes Weinstein monofilament test. Normal tactile sensation bilaterally. Nail Exam: Pt has thick disfigured discolored nails with subungual debris noted bilateral entire nail hallux through fifth toenails Ulcer Exam: There is no evidence of ulcer or pre-ulcerative changes or infection. Orthopedic Exam: Muscle tone and strength are WNL. No limitations in general ROM. No crepitus or effusions noted. Foot type and digits show no abnormalities. Bony prominences are unremarkable. Skin:  Porokeratosis sub 5th met left foot. No infection or ulcers  Diagnosis:  Onychomycosis, , Pain in right toe, pain in left toes  Treatment & Plan Procedures and Treatment: Consent by patient was obtained for treatment procedures.   Debridement of mycotic and hypertrophic toenails, 1 through 5 bilateral and clearing of subungual debris. No ulceration, no infection noted. Patient desires fungus topical for her nails.Return Visit-Office Procedure: Patient instructed to return to the office for a follow up visit 3 months for continued evaluation and treatment.    Gardiner Barefoot DPM

## 2018-11-09 ENCOUNTER — Ambulatory Visit: Payer: Medicare Other | Admitting: Podiatry

## 2018-11-09 ENCOUNTER — Other Ambulatory Visit: Payer: Self-pay

## 2018-11-09 ENCOUNTER — Ambulatory Visit (INDEPENDENT_AMBULATORY_CARE_PROVIDER_SITE_OTHER): Payer: BC Managed Care – PPO

## 2018-11-09 ENCOUNTER — Other Ambulatory Visit: Payer: Self-pay | Admitting: Podiatry

## 2018-11-09 ENCOUNTER — Encounter: Payer: Self-pay | Admitting: Podiatry

## 2018-11-09 VITALS — HR 99

## 2018-11-09 DIAGNOSIS — L03032 Cellulitis of left toe: Secondary | ICD-10-CM | POA: Diagnosis not present

## 2018-11-09 DIAGNOSIS — E119 Type 2 diabetes mellitus without complications: Secondary | ICD-10-CM

## 2018-11-09 DIAGNOSIS — L02612 Cutaneous abscess of left foot: Secondary | ICD-10-CM

## 2018-11-09 DIAGNOSIS — M79675 Pain in left toe(s): Secondary | ICD-10-CM

## 2018-11-09 DIAGNOSIS — L02619 Cutaneous abscess of unspecified foot: Secondary | ICD-10-CM | POA: Insufficient documentation

## 2018-11-09 DIAGNOSIS — B351 Tinea unguium: Secondary | ICD-10-CM

## 2018-11-09 DIAGNOSIS — M79674 Pain in right toe(s): Secondary | ICD-10-CM

## 2018-11-09 DIAGNOSIS — M21619 Bunion of unspecified foot: Secondary | ICD-10-CM

## 2018-11-09 DIAGNOSIS — Q828 Other specified congenital malformations of skin: Secondary | ICD-10-CM

## 2018-11-09 MED ORDER — CEPHALEXIN 500 MG PO CAPS: 500.0000 mg | ORAL_CAPSULE | Freq: Three times a day (TID) | ORAL | 0 refills | Status: DC

## 2018-11-09 NOTE — Progress Notes (Signed)
Complaint:  Visit Type: Patient returns to my office for continued preventative foot care services. Complaint: Patient states" my nails have grown long and thick and become painful to walk and wear shoes" Patient has been diagnosed with DM with no foot complications.  No changes to ROS.  Callus left foot is painful.  She says this callus has been painful on and off since her last visit.  She says she has occasional drainage from the outside ball left foot.  She says she applies neosporin and bandage.  This patient presents for preventative foot care services.  Podiatric Exam: Vascular: dorsalis pedis and posterior tibial pulses are palpable bilateral. Capillary return is immediate. Temperature gradient is WNL. Skin turgor WNL  Sensorium: Normal Semmes Weinstein monofilament test. Normal tactile sensation bilaterally. Nail Exam: Pt has thick disfigured discolored nails with subungual debris noted bilateral entire nail hallux through fifth toenails Ulcer Exam: There is no evidence of ulcer or pre-ulcerative changes or infection. Orthopedic Exam: Muscle tone and strength are WNL. No limitations in general ROM. No crepitus or effusions noted. Foot type and digits show no abnormalities. Bony prominences are unremarkable. Skin:  Porokeratosis sub 5th met left foot. No infection or ulcers.  Post inflammatory black area on dorsum of fifth metatarsal left foot.  Diagnosis:  Onychomycosis, , Pain in right toe, pain in left toes  Abscess dorsolateral aspect left foot.    Treatment & Plan Procedures and Treatment: Consent by patient was obtained for treatment procedures.   Debridement of mycotic and hypertrophic toenails, 1 through 5 bilateral and clearing of subungual debris. No ulceration, no infection noted.  Debridement of porokeratosis sub 5th met left foot.  During my treatment white creamy drainage was noted drained from dorsolateral aspect left foot.  Culture was taken at site of drainage.  DSD applied.   Xray was taken and no bony pathology noted.  Prescribe cephalexin 500 mg.  # 30.    Marland KitchenReturn Visit-Office Procedure: Patient instructed to return to the office for a follow up visit 3 months for continued evaluation and treatment.    Gardiner Barefoot DPM

## 2018-11-10 LAB — UNLABELED: Test Ordered On Req: 44463933793035

## 2018-11-15 LAB — PAT ID TIQ DOC: Test Affected: 4446

## 2018-11-15 LAB — ANAEROBIC AND AEROBIC CULTURE
MICRO NUMBER:: 793055
MICRO NUMBER:: 793056
SPECIMEN QUALITY:: ADEQUATE
SPECIMEN QUALITY:: ADEQUATE

## 2018-12-20 ENCOUNTER — Ambulatory Visit: Payer: BC Managed Care – PPO | Admitting: Cardiology

## 2018-12-22 ENCOUNTER — Ambulatory Visit: Payer: BC Managed Care – PPO | Admitting: Cardiology

## 2018-12-22 ENCOUNTER — Other Ambulatory Visit: Payer: Self-pay

## 2018-12-22 ENCOUNTER — Encounter: Payer: Self-pay | Admitting: Cardiology

## 2018-12-22 VITALS — BP 151/76 | HR 79 | Temp 96.6°F | Ht 67.0 in | Wt 242.2 lb

## 2018-12-22 DIAGNOSIS — I10 Essential (primary) hypertension: Secondary | ICD-10-CM

## 2018-12-22 DIAGNOSIS — E669 Obesity, unspecified: Secondary | ICD-10-CM

## 2018-12-22 DIAGNOSIS — I2119 ST elevation (STEMI) myocardial infarction involving other coronary artery of inferior wall: Secondary | ICD-10-CM

## 2018-12-22 DIAGNOSIS — Z9861 Coronary angioplasty status: Secondary | ICD-10-CM

## 2018-12-22 DIAGNOSIS — I2510 Atherosclerotic heart disease of native coronary artery without angina pectoris: Secondary | ICD-10-CM | POA: Diagnosis not present

## 2018-12-22 DIAGNOSIS — Z955 Presence of coronary angioplasty implant and graft: Secondary | ICD-10-CM | POA: Diagnosis not present

## 2018-12-22 DIAGNOSIS — E1169 Type 2 diabetes mellitus with other specified complication: Secondary | ICD-10-CM

## 2018-12-22 DIAGNOSIS — I493 Ventricular premature depolarization: Secondary | ICD-10-CM

## 2018-12-22 DIAGNOSIS — E785 Hyperlipidemia, unspecified: Secondary | ICD-10-CM

## 2018-12-22 MED ORDER — HYDROCHLOROTHIAZIDE 25 MG PO TABS: 25.0000 mg | ORAL_TABLET | Freq: Every day | ORAL | 3 refills | Status: DC

## 2018-12-22 NOTE — Progress Notes (Signed)
PCP: [name], MD  Clinic Note: Chief Complaint  Patient presents with  . Annual Exam    Cardiology exam  . Coronary Artery Disease    No angina  . Edema    Has been spending a lot of time sitting while working from home    HPI: Tammy Good is a 68 y.o. female with a PMH notable for CAD-PCI who presents today for delayed annual f/u -as well as for preop evaluation for ear surgery.  Inferior STEMI with PCI to the RCA in June 2015. -- occluded RCA (Integrity Resolute DES 3.0 mm x 38 mm -- postdilated to 3.35 mm) with significant thrombus burden. Preserved EF of 55% with inferior hypokinesis and grade 1 diastolic dysfunction  Asucena S Raysor was last seen in September 2019 (for preop evaluation for ear surgery).  Was doing relatively well.  No chest pain or pressure.  Minimal edema.  Was somewhat deconditioned and with some with exertional dyspnea.  Okay for surgery.  No medication changes.  Recent Hospitalizations: None  Studies Personally Reviewed - (if available, images/films reviewed: From Epic Chart or Care Everywhere)  None  Interval History: Ms. Ludolph presents here today doing well.  Had ear Sgx last Oct - still waiting on hearing aids -- due to come this Friday. Lots of sitting - working from home.  R foot mild swelling with leg s hanging -- go down @ night.  She has been very careful about making sure she does not have any foods with added salt.  She does eat out about half the time, but when she does all she eats is some type of salad.  She avoids fried foods, and tries to avoid canned foods when she cooks at home.  Still has some RLQ - inner hip pain with walking - goes away with rest. Happens when starts walking, and does limit her walking activities some..   Cardiovascular ROS: no chest pain or dyspnea on exertion negative for - irregular heartbeat, loss of consciousness, orthopnea, palpitations, paroxysmal nocturnal dyspnea, rapid heart rate, shortness  of breath or syncope / near syncope; TIA / amaurosis fugax  Activity level has gone down with COVID -- not able to get to gym / pool. Pre-COVID, was walking more & doing stationary bike.  The gym is now started open back up again and she is hoping to get back into doing her exercise.  A bit upset about having gained weight.  ROS: A comprehensive was performed. Review of Systems  Constitutional: Negative for malaise/fatigue and weight loss.  HENT: Negative for congestion and nosebleeds.   Respiratory: Negative for cough, shortness of breath and wheezing.   Gastrointestinal: Negative for abdominal pain, blood in stool and melena.  Genitourinary: Negative for hematuria.  Musculoskeletal: Negative for joint pain (Only mild arthralgia pains.).       Right thigh/leg pain with walking -- post-cath  Neurological: Positive for tingling (Somewhat tingling pain in the right thigh with walking.  Sounds somewhat radicular.). Negative for focal weakness.  Psychiatric/Behavioral: The patient is not nervous/anxious.   All other systems reviewed and are negative.  The patient does not have symptoms concerning for COVID-19 infection (fever, chills, cough, or new shortness of breath).  The patient is practicing social distancing. Still working from home.  I have reviewed and (if needed) personally updated the patient's problem list, medications, allergies, past medical and surgical history, social and family history.   Past Medical History:  Diagnosis Date  . CAD  S/P percutaneous coronary angioplasty 08/2013   100% mRCA - PCI Integrity Resolute DES 3.0 mm x 38 mm - 3.35 mm; PTCA of RPA V 2.0 mm x 15 mm  . Cholesteatoma    right  . Diabetes mellitus type 2 in obese (HCC)    On insulin and Invokana  . Hyperlipidemia with target LDL less than 70   . Hypothyroidism (acquired)   . Mild essential hypertension   . Obesity (BMI 30-39.9) 11/17/2013  . Panhypopituitarism ([place])   . ST elevation myocardial  infarction (STEMI) of inferior wall, subsequent episode of care ([place]) 08/2013   80% branch of D1, 40% mid AV groove circumflex, 100% RCA with subacute thrombus -- thrombus extending into RPA V with 100% occlusion after initial angioplasty of mid RCA ;; Post MI ECHO 6/9/'15: EF 50-55%, mild LVH with moderate HK of inferior wall, Gr1 DD, mild LA dilation; mildly reduced RV function  . Status post insertion of drug eluting coronary artery stent to RCA emergently 09/16/2013    Past Surgical History:  Procedure Laterality Date  . Cardiac Event Monitor  July-August 2015   Sinus rhythm with PVCs  . COLONOSCOPY N/A 08/31/2013   Procedure: COLONOSCOPY;  Surgeon: Juanita Craver, MD;  Location: [facility] ENDOSCOPY;  Service: Endoscopy;  Laterality: N/A;  . ESOPHAGOGASTRODUODENOSCOPY N/A 09/01/2013   Procedure: ESOPHAGOGASTRODUODENOSCOPY (EGD);  Surgeon: Beryle Beams, MD;  Location: [facility] ENDOSCOPY;  Service: Endoscopy;  Laterality: N/A;  bedside  . LEFT HEART CATHETERIZATION WITH CORONARY ANGIOGRAM N/A 08/30/2013   Procedure: LEFT HEART CATHETERIZATION WITH CORONARY ANGIOGRAM;  Surgeon: Leonie Man, MD;  Location: [facility] CATH LAB: 100% mRCA (thrombus - extends to RPAV), 80% D1, 40% AVG Cx.  [name] PERCUTANEOUS CORONARY STENT INTERVENTION (PCI-S)  08/30/2013   Procedure: PERCUTANEOUS CORONARY STENT INTERVENTION (PCI-S);  Surgeon: Leonie Man, MD;  Location: [facility] CATH LAB;  Integrity Resolute DES 2.0 mm x 38 mm -- 3.35 mm.; PTCA of proximal RPA V. - 2.0 mm x 15 mm balloon  . PITUITARY SURGERY    . TRANSTHORACIC ECHOCARDIOGRAM  08/30/2013   mild LVH. EF 50-55%. Moderate HK of the entire inferior myocardium. GR 1 DD. Mild LA dilation. Mildly reduced RV function  . TYMPANOMASTOIDECTOMY Right 12/28/2017   Procedure: RIGHT TYMPANOMASTOIDECTOMY;  Surgeon: Leta Baptist, MD;  Location: [facility];  Service: ENT;  Laterality: Right;    Current Meds  Medication Sig  . atorvastatin (LIPITOR) 40 MG tablet Take 1 tablet (40 mg  total) by mouth daily at 6 PM.  . dexamethasone (DECADRON) 0.5 MG tablet Take 0.5 mg by mouth daily.   [name] FARXIGA 10 MG TABS tablet   . Insulin Glargine (BASAGLAR KWIKPEN) 100 UNIT/ML SOPN Inject 52 Units into the skin at bedtime. inject 52 units into skin at bedtime  . iron polysaccharides (FERREX 150) 150 MG capsule Take 150 mg by mouth daily.  [name] levothyroxine (SYNTHROID, LEVOTHROID) 175 MCG tablet Take 175 mcg by mouth daily before breakfast.  . lisinopril (PRINIVIL,ZESTRIL) 20 MG tablet Take 1/2 tablet by mouth daily (start on 11/17/13)  . metoprolol tartrate (LOPRESSOR) 25 MG tablet TAKE 1 TABLET TWICE A DAY  . ONETOUCH VERIO test strip USE 1 STRIP TO CHECK GLUCOSE THREE TIMES DAILY  . pantoprazole (PROTONIX) 40 MG tablet Take 1 tablet (40 mg total) by mouth 2 (two) times daily before a meal.  . [DISCONTINUED] benzonatate (TESSALON) 100 MG capsule TAKE 1 CAPSULE BY MOUTH EVERY 8 HOURS AS NEEDED FOR COUGH  . [DISCONTINUED]  cephALEXin (KEFLEX) 500 MG capsule Take 1 capsule (500 mg total) by mouth 3 (three) times daily.  . [DISCONTINUED] CIPRODEX OTIC suspension INSILL 5 DROPS IN RIGHT EAR BID FOR 14 DAYS  . [DISCONTINUED] insulin aspart (NOVOLOG FLEXPEN) 100 UNIT/ML FlexPen Inject into the skin 3 (three) times daily with meals. Sliding scale  . [DISCONTINUED] neomycin-polymyxin b-dexamethasone (MAXITROL) 3.5-10000-0.1 SUSP INSTILL 1 DROP IN OU QID  . [DISCONTINUED] ofloxacin (OCUFLOX) 0.3 % ophthalmic solution PLACE 2 DROPS IN BOTHE EYES EVERY 2 4 HOURS FOR 2 DAYS THEN 2 DROPS FOUR TIMES A DAY FOR 5 DAYS  . [DISCONTINUED] oxyCODONE-acetaminophen (PERCOCET) 5-325 MG tablet Take 1 tablet by mouth every 4 (four) hours as needed for severe pain.  . [DISCONTINUED] ticagrelor (BRILINTA) 60 MG TABS tablet Take 1 tablet (60 mg total) by mouth 2 (two) times daily.  . [DISCONTINUED] trimethoprim-polymyxin b (POLYTRIM) ophthalmic solution INSTILL 1 DROP INTO EACH EYE 4 TIMES DAILY    Allergies  Allergen  Reactions  . Strawberry Extract Itching, Swelling and Anaphylaxis    Mouth swells and gets itchy    Social History   Tobacco Use  . Smoking status: Former Research scientist (life sciences)  . Smokeless tobacco: Never Used  Substance Use Topics  . Alcohol use: No  . Drug use: No   Social History   Social History Narrative   Widow. Works at [employer].   Former smoker.   Overall not very active.  Hoping to get into water aerobics class.    family history includes Alzheimer's disease in her maternal grandmother; Cancer in her sister; Cancer (age of onset: 35) in her mother; Heart attack (age of onset: 74) in her father.  Wt Readings from Last 3 Encounters:  12/22/18 242 lb 3.2 oz (109.9 kg)  12/28/17 234 lb 12.6 oz (106.5 kg)  11/30/17 234 lb (106.1 kg)  -- Less Active   PHYSICAL EXAM BP (!) 151/76   Pulse 79   Temp (!) 96.6 F (35.9 C)   Ht 5\' 7"  (1.702 m)   Wt 242 lb 3.2 oz (109.9 kg)   SpO2 95%   BMI 37.93 kg/m  --She has not yet taken her blood pressure medication today. Physical Exam  Constitutional: She is oriented to person, place, and time. She appears well-developed and well-nourished. No distress.  Morbidly Obese.  Well-groomed  HENT:  Head: Normocephalic and atraumatic.  Neck: Normal range of motion. Neck supple. No hepatojugular reflux and no JVD present. Carotid bruit is not present.  Cardiovascular: Normal rate, regular rhythm, S1 normal and S2 normal.  No extrasystoles are present. PMI is not displaced (Unable to palpate due to obesity). Exam reveals distant heart sounds and decreased pulses (Decreased pedal pulses due to swelling and obesity). Exam reveals no gallop and no friction rub.  No murmur heard. Pulmonary/Chest: Effort normal and breath sounds normal. No respiratory distress. She has no wheezes. She has no rales. She exhibits no tenderness.  Abdominal: Soft. Bowel sounds are normal. She exhibits no distension. There is no abdominal tenderness. There is no rebound.   Obese; no HSM palpable due to body habitus  Musculoskeletal: Normal range of motion.        General: No edema (2+ right ankle only edema.  Otherwise mild puffy swelling but no edema).  Neurological: She is alert and oriented to person, place, and time.  Psychiatric: She has a normal mood and affect. Her behavior is normal. Judgment and thought content normal.  Vitals reviewed.  Adult ECG Report  Rate: 61;  Rhythm: normal sinus rhythm; high right axis (265 degrees).  Inferior MI, age undetermined.  Cannot exclude anterior Mycamine determined.  Nonspecific ST-T wave changes.  Narrative Interpretation: Stable EKG with no notable change  Other studies Reviewed: Additional studies/ records that were reviewed today include:  Recent Labs: (09/2018) -TC 113, TG 70, HDL 39, LDL 60.  A1c 6.7  Cr 0.7.   ASSESSMENT / PLAN: Problem List Items Addressed This Visit    Essential hypertension (Chronic)    Blood pressure is high today on combination of lisinopril and metoprolol. While my normal routine will be to increase medications that she is on, since she is complaining of some edema I will add HCTZ that she will take with her lisinopril.   HCTZ 25 mg daily.  BMP check in 2 weeks with blood pressure recheck.      Relevant Medications   hydrochlorothiazide (HYDRODIURIL) 25 MG tablet   Other Relevant Orders   Basic metabolic panel   Obesity (BMI 30-39.9) (Chronic)    We reiterated the importance of her really working on diet restrictions.  Is not just watch the salt, she also needs to decrease her portions etc.  Also again reiterated importance of her getting back into her exercise regimen.  Goal should be to be down about 220 pounds by next year's visit      Status post insertion of drug eluting coronary artery stent to [facility] emergently and PTCA to prox. PLA (Chronic)    Status post emergency PCI of the RCA.  Last visit we stopped aspirin with plans for her to be on maintenance Plavix which  I do not see listed.  If she is no longer taking Plavix, she should be back on baby aspirin.  We will notify her.      Relevant Orders   EKG 12-Lead (Completed)   Basic metabolic panel   Hyperlipidemia LDL goal <70 (Chronic)    Recent lipid panel looks great on current dose of atorvastatin.  No change      Relevant Medications   hydrochlorothiazide (HYDRODIURIL) 25 MG tablet   ST elevation myocardial infarction (STEMI) of inferior wall, subsequent episode of care ([place]) (Chronic)    Distant history of inferior STEMI.  Had an occluded RCA treated with DES stent.  EKG does suggest prior inferior infarct, and echocardiogram did show some mild diffuse hypokinesis.  No further anginal symptoms.No heart failure symptoms.  Overall stable.      Relevant Medications   hydrochlorothiazide (HYDRODIURIL) 25 MG tablet   CAD S/P percutaneous coronary angioplasty - Primary (Chronic)   Relevant Medications   hydrochlorothiazide (HYDRODIURIL) 25 MG tablet   Other Relevant Orders   EKG 12-Lead (Completed)   Basic metabolic panel   Diabetes mellitus type 2 in obese (HCC) (Chronic)    Appropriate on Farxiga (an SGLT2 inhibitor -also noted for cardiac benefit) along with insulin      PVC's (premature ventricular contractions)    Pretty much asymptomatic now that she is on a beta-blocker.      Relevant Medications   hydrochlorothiazide (HYDRODIURIL) 25 MG tablet     For mild swelling, I also recommend that she wear support socks while she is sitting a lot, or traveling.  Also discussed foot and ankle exercises to help.  But more importantly she needs to get up and walk.  COVID-19 Education: The signs and symptoms of COVID-19 were discussed with the patient and how to seek care for testing (  follow up with PCP or arrange E-visit).   The importance of social distancing was discussed today.  I spent a total of 24 minutes with the patient and chart review. >  50% of the time was spent in direct  patient consultation.   Current medicines are reviewed at length with the patient today.  (+/- concerns) none   Patient Instructions  Medication Instructions:  -- --START  TAKING HYDROCHLOROTHIAZIDE ( HCTZ) 25 MG ONE TABLET DAILY   If you need a refill on your cardiac medications before your next appointment, please call your pharmacy.   Lab work: [employer] IN [address] [place] A BLOOD PRESSURE CHECK  If you have labs (blood work) drawn today and your tests are completely normal, you will receive your results only by: [name] MyChart Message (if you have MyChart) OR . A paper copy in the mail If you have any lab test that is abnormal or we need to change your treatment, we will call you to review the results.  Testing/Procedures:  NOT NEEDED Follow-Up: At [facility], you and your health needs are our priority.  As part of our continuing mission to provide you with exceptional heart care, we have created designated Provider Care Teams.  These Care Teams include your primary Cardiologist (physician) and Advanced Practice Providers (APPs -  Physician Assistants and Nurse Practitioners) who all work together to provide you with the care you need, when you need it. . You will need a follow up appointment in  93months.  Please call our office 2 months in advance to schedule this appointment.  You may see Glenetta Hew, MD or one of the following Advanced Practice Providers on your designated Care Team:   . Rosaria Ferries, PA-C . Jory Sims, DNP, ANP-Your physician recommends that you schedule a follow-up appointment in 6 MONTH .   Any Other Special Instructions Will Be Listed Below (If Applicable).   RECOMMEND YOU WEAR SUPPORT HOSE OR KNEE HI  (MODERATE WEIGHT - 15-20 MMHG) --DO FOOT AND LEG EXERCISES- FLEXING AND            EXTENDING FEET  --ELEVATED LEGS     Studies Ordered:   Orders Placed This Encounter  Procedures  . Basic metabolic panel  . EKG 12-Lead       Glenetta Hew, M.D., M.S. Interventional Cardiologist   Pager # [phone] Phone # [phone] [address] [place], [place] [address]   Thank you for choosing Heartcare at [facility]!!

## 2018-12-22 NOTE — Patient Instructions (Addendum)
Medication Instructions:  -- --START  TAKING HYDROCHLOROTHIAZIDE ( HCTZ) 25 MG ONE TABLET DAILY   If you need a refill on your cardiac medications before your next appointment, please call your pharmacy.   Lab work: [employer] IN [address] [place] A BLOOD PRESSURE CHECK  If you have labs (blood work) drawn today and your tests are completely normal, you will receive your results only by: [name] MyChart Message (if you have MyChart) OR . A paper copy in the mail If you have any lab test that is abnormal or we need to change your treatment, we will call you to review the results.  Testing/Procedures:  NOT NEEDED Follow-Up: At [facility], you and your health needs are our priority.  As part of our continuing mission to provide you with exceptional heart care, we have created designated Provider Care Teams.  These Care Teams include your primary Cardiologist (physician) and Advanced Practice Providers (APPs -  Physician Assistants and Nurse Practitioners) who all work together to provide you with the care you need, when you need it. . You will need a follow up appointment in  59months.  Please call our office 2 months in advance to schedule this appointment.  You may see Glenetta Hew, MD or one of the following Advanced Practice Providers on your designated Care Team:   . Rosaria Ferries, PA-C . Jory Sims, DNP, ANP-Your physician recommends that you schedule a follow-up appointment in 6 MONTH .   Any Other Special Instructions Will Be Listed Below (If Applicable).   RECOMMEND YOU WEAR SUPPORT HOSE OR KNEE HI  (MODERATE WEIGHT - 15-20 MMHG) --DO FOOT AND LEG EXERCISES- FLEXING AND            EXTENDING FEET  --ELEVATED LEGS

## 2018-12-25 ENCOUNTER — Encounter: Payer: Self-pay | Admitting: Cardiology

## 2018-12-25 NOTE — Assessment & Plan Note (Signed)
Distant history of inferior STEMI.  Had an occluded RCA treated with DES stent.  EKG does suggest prior inferior infarct, and echocardiogram did show some mild diffuse hypokinesis.  No further anginal symptoms.No heart failure symptoms.  Overall stable.

## 2018-12-25 NOTE — Assessment & Plan Note (Signed)
Appropriate on Farxiga (an SGLT2 inhibitor -also noted for cardiac benefit) along with insulin

## 2018-12-25 NOTE — Assessment & Plan Note (Signed)
We reiterated the importance of her really working on diet restrictions.  Is not just watch the salt, she also needs to decrease her portions etc.  Also again reiterated importance of her getting back into her exercise regimen.  Goal should be to be down about 220 pounds by next year's visit

## 2018-12-25 NOTE — Assessment & Plan Note (Signed)
Blood pressure is high today on combination of lisinopril and metoprolol. While my normal routine will be to increase medications that she is on, since she is complaining of some edema I will add HCTZ that she will take with her lisinopril.   HCTZ 25 mg daily.  BMP check in 2 weeks with blood pressure recheck.

## 2018-12-25 NOTE — Assessment & Plan Note (Signed)
Pretty much asymptomatic now that she is on a beta-blocker.

## 2018-12-25 NOTE — Assessment & Plan Note (Signed)
Status post emergency PCI of the RCA.  Last visit we stopped aspirin with plans for her to be on maintenance Plavix which I do not see listed.  If she is no longer taking Plavix, she should be back on baby aspirin.  We will notify her.

## 2018-12-25 NOTE — Assessment & Plan Note (Signed)
Recent lipid panel looks great on current dose of atorvastatin.  No change

## 2019-01-03 ENCOUNTER — Other Ambulatory Visit: Payer: Self-pay

## 2019-01-03 ENCOUNTER — Ambulatory Visit: Payer: BC Managed Care – PPO | Admitting: *Deleted

## 2019-01-03 VITALS — BP 113/68 | HR 69 | Ht 67.0 in | Wt 242.0 lb

## 2019-01-03 DIAGNOSIS — I10 Essential (primary) hypertension: Secondary | ICD-10-CM

## 2019-01-03 NOTE — Progress Notes (Signed)
1.) Reason for visit: After starting HCTZ  2.) Name of MD requesting visit: Harding  3.) Assessment and plan per MD: BP: 113/68, HR: 69  Labs were collected.   Will route to Dr. Ellyn Hack.

## 2019-01-04 LAB — BASIC METABOLIC PANEL
BUN/Creatinine Ratio: 11 — ABNORMAL LOW (ref 12–28)
BUN: 10 mg/dL (ref 8–27)
CO2: 29 mmol/L (ref 20–29)
Calcium: 9.2 mg/dL (ref 8.7–10.3)
Chloride: 99 mmol/L (ref 96–106)
Creatinine, Ser: 0.92 mg/dL (ref 0.57–1.00)
GFR calc Af Amer: 75 mL/min/{1.73_m2} (ref >59)
GFR calc non Af Amer: 65 mL/min/{1.73_m2} (ref >59)
Glucose: 104 mg/dL — ABNORMAL HIGH (ref 65–99)
Potassium: 4.5 mmol/L (ref 3.5–5.2)
Sodium: 142 mmol/L (ref 134–144)

## 2019-02-09 ENCOUNTER — Other Ambulatory Visit: Payer: Self-pay

## 2019-02-09 ENCOUNTER — Ambulatory Visit: Payer: Medicare Other | Admitting: Podiatry

## 2019-02-09 ENCOUNTER — Encounter: Payer: Self-pay | Admitting: Podiatry

## 2019-02-09 DIAGNOSIS — M79675 Pain in left toe(s): Secondary | ICD-10-CM

## 2019-02-09 DIAGNOSIS — B351 Tinea unguium: Secondary | ICD-10-CM

## 2019-02-09 DIAGNOSIS — E119 Type 2 diabetes mellitus without complications: Secondary | ICD-10-CM

## 2019-02-09 DIAGNOSIS — M79674 Pain in right toe(s): Secondary | ICD-10-CM

## 2019-02-09 DIAGNOSIS — Q828 Other specified congenital malformations of skin: Secondary | ICD-10-CM | POA: Diagnosis not present

## 2019-02-09 NOTE — Progress Notes (Signed)
Complaint:  Visit Type: Patient returns to my office for continued preventative foot care services. Complaint: Patient states" my nails have grown long and thick and become painful to walk and wear shoes" Patient has been diagnosed with DM with no foot complications. The patient presents for preventative foot care services. No changes to ROS.  Callus left foot is  painful today and she says the area of her previous infection left foot is also sore..  Podiatric Exam: Vascular: dorsalis pedis and posterior tibial pulses are palpable bilateral. Capillary return is immediate. Temperature gradient is WNL. Skin turgor WNL  Sensorium: Normal Semmes Weinstein monofilament test. Normal tactile sensation bilaterally. Nail Exam: Pt has thick disfigured discolored nails with subungual debris noted bilateral entire nail hallux through fifth toenails Ulcer Exam: There is no evidence of ulcer or pre-ulcerative changes or infection. Orthopedic Exam: Muscle tone and strength are WNL. No limitations in general ROM. No crepitus or effusions noted. Foot type and digits show no abnormalities. Bony prominences are unremarkable. No signs of redness or swelling dorsolateral aspect left foot. Skin:  Porokeratosis sub 5th met left foot. No infection or ulcers.  Diagnosis:  Onychomycosis, , Pain in right toe, pain in left toes  Treatment & Plan Procedures and Treatment: Consent by patient was obtained for treatment procedures.   Debridement of mycotic and hypertrophic toenails, 1 through 5 bilateral and clearing of subungual debris. No ulceration, no infection noted. Debridement of porokeratosis sub 5th met left foot.  Discussed the painful callus subfifth metatarsal left foot with this patient.  Debrided the callus and asked the pedorthist to make off weight bearing pad.  Told her that at a future visit if the pain persists,  we should re x-ray  the site and compare the next x-ray to the  previous x-ray.   We should also  consider possible surgical correction by removing the head of the fifth metatarsal surgically.  Patient to return to the office in 10 weeks and we will reevaluate and discuss her painful callus at that visit. Patient agreed to proceed with that plan plan. Return Visit-Office Procedure: Patient instructed to return to the office for a follow up visit 3 months for continued evaluation and treatment.    Gardiner Barefoot DPM

## 2019-02-24 DIAGNOSIS — H7190 Unspecified cholesteatoma, unspecified ear: Secondary | ICD-10-CM | POA: Insufficient documentation

## 2019-03-03 ENCOUNTER — Telehealth: Payer: Self-pay | Admitting: *Deleted

## 2019-03-03 NOTE — Telephone Encounter (Addendum)
   Primary Cardiologist: Glenetta Hew, MD  Chart reviewed as part of pre-operative protocol coverage. Given past medical history and time since last visit, based on ACC/AHA guidelines, Tammy Good would be at acceptable risk for the planned procedure without further cardiovascular testing.   She was last seen by her primary cardiologist Dr. Tamala Julian on 12/22/18.   I will route this recommendation to the requesting party via Epic fax function and remove from pre-op pool.  Left patient a message on her home phone per DPR that clearance had been provided.   Please call with questions.  Loel Dubonnet, NP 03/03/2019, 3:00 PM

## 2019-03-03 NOTE — Telephone Encounter (Signed)
   [facility] Pre-operative Risk Assessment    Request for surgical clearance:  1. What type of surgery is being performed? EGD AND COLONOSCOPY  2. When is this surgery scheduled? 03/10/19   3. What type of clearance is required (medical clearance vs. Pharmacy clearance to hold med vs. Both)? MEDICAL  4. Are there any medications that need to be held prior to surgery and how long? N/A  5. Practice name and name of physician performing surgery? [facility] DR HUNG   6. What is your office phone number [phone]    7.   What is your office fax number [phone]  8.   Anesthesia type (None, local, MAC, general) ? PROPOFOL   Tammy Good 03/03/2019, 11:21 AM  _________________________________________________________________   (provider comments below)

## 2019-04-20 ENCOUNTER — Other Ambulatory Visit: Payer: Self-pay

## 2019-04-20 ENCOUNTER — Encounter: Payer: Self-pay | Admitting: Podiatry

## 2019-04-20 ENCOUNTER — Ambulatory Visit: Payer: Medicare Other | Admitting: Podiatry

## 2019-04-20 DIAGNOSIS — M79674 Pain in right toe(s): Secondary | ICD-10-CM

## 2019-04-20 DIAGNOSIS — E119 Type 2 diabetes mellitus without complications: Secondary | ICD-10-CM

## 2019-04-20 DIAGNOSIS — M79675 Pain in left toe(s): Secondary | ICD-10-CM

## 2019-04-20 DIAGNOSIS — Q828 Other specified congenital malformations of skin: Secondary | ICD-10-CM

## 2019-04-20 DIAGNOSIS — B351 Tinea unguium: Secondary | ICD-10-CM | POA: Diagnosis not present

## 2019-04-20 DIAGNOSIS — L02612 Cutaneous abscess of left foot: Secondary | ICD-10-CM | POA: Diagnosis not present

## 2019-04-20 DIAGNOSIS — L03032 Cellulitis of left toe: Secondary | ICD-10-CM

## 2019-04-20 MED ORDER — SULFAMETHOXAZOLE-TRIMETHOPRIM 400-80 MG PO TABS: 1.0000 | ORAL_TABLET | Freq: Two times a day (BID) | ORAL | 0 refills | Status: DC

## 2019-04-20 NOTE — Patient Instructions (Signed)

## 2019-04-20 NOTE — Progress Notes (Signed)
Complaint:  Visit Type: Patient returns to my office for continued preventative foot care services. Complaint: Patient states" my nails have grown long and thick and become painful to walk and wear shoes" Patient has been diagnosed with DM with no foot complications. The patient presents for preventative foot care services. No changes to ROS.  Callus left foot on the bottom of her left foot is   painful today and she says the area of her previous infection left foot is not sore.  Patient says she still has episodes of bloody drainage for her left foot which is intermittantly painful.  Podiatric Exam: Vascular: dorsalis pedis and posterior tibial pulses are palpable bilateral. Capillary return is immediate. Temperature gradient is WNL. Skin turgor WNL  Sensorium: Normal Semmes Weinstein monofilament test. Normal tactile sensation bilaterally. Nail Exam: Pt has thick disfigured discolored nails with subungual debris noted bilateral entire nail hallux through fifth toenails Ulcer Exam: There is no evidence of ulcer or pre-ulcerative changes or infection. Orthopedic Exam: Muscle tone and strength are WNL. No limitations in general ROM. No crepitus or effusions noted. Foot type and digits show no abnormalities. Bony prominences are unremarkable. No signs of redness or swelling dorsolateral aspect left foot..  No signs of redness or inflammation noted 5th metatarsal left foot. Skin:  Porokeratosis sub 5th met left foot. No infection or ulcers.  Diagnosis:  Onychomycosis, , Pain in right toe, pain in left toes  Draining wound/cellulitis left foot.  Treatment & Plan Procedures and Treatment: Consent by patient was obtained for treatment procedures.   Debridement of mycotic and hypertrophic toenails, 1 through 5 bilateral and clearing of subungual debris. Debridement of porokeratosis sub 5th met left foot was performed and bloody drainage came out from the left foot.  Silvadine/DSD applied.  .  Discussed the  painful callus subfifth metatarsal left foot with this patient.   Told her to RTC 1 week.  Home soaks given to patient.  Prescribe Bactrim.    Patient was not in favor of x-ray today.  Since there was no evidence of cellulitis we will hold off on another xray.  Patient has received bills from previous surgery.    We should also consider possible surgical correction by removing the head of the fifth metatarsal surgically.  Patient to return to the office in 1 weeks and we will reevaluate and discuss her painful callus at that visit. Patient agreed to proceed with that plan  Return Visit-Office Procedure: Patient instructed to return to the office for a follow up visit 3 months for continued evaluation and treatment.    Gardiner Barefoot DPM

## 2019-04-23 ENCOUNTER — Encounter: Payer: Self-pay | Admitting: Podiatry

## 2019-04-23 LAB — WOUND CULTURE
MICRO NUMBER:: 10087634
SPECIMEN QUALITY:: ADEQUATE

## 2019-04-25 ENCOUNTER — Telehealth: Payer: Self-pay | Admitting: Podiatry

## 2019-04-25 ENCOUNTER — Telehealth: Payer: Self-pay | Admitting: *Deleted

## 2019-04-25 NOTE — Telephone Encounter (Signed)
This patient called the office today stating she was interested in finding out the results of her lab tests.  I called this patient back at 1230 and told her that she grew a bacterial infection from her foot.  I told her she was taking the medicine that the bacteria was sensitive to.  She says she is having no pain or discomfort and no drainage at the site of the painful wound opening.  She is very pleased with her progress.  I did inform her that I am considering a second course of antibiotics since this could have  been going on for a long period of time.  Patient to call back to the office with any questions or concerns.

## 2019-04-25 NOTE — Telephone Encounter (Signed)
I attempted to call the patient.  She had sent a message stating she could not understand her lab results.  I left her a message and asked her to call and schedule an appointment to see Dr. Prudence Davidson on tomorrow.  Her lab report says she has MRSA.

## 2019-05-18 ENCOUNTER — Ambulatory Visit: Payer: BC Managed Care – PPO | Admitting: Podiatry

## 2019-05-18 ENCOUNTER — Ambulatory Visit (INDEPENDENT_AMBULATORY_CARE_PROVIDER_SITE_OTHER): Payer: BC Managed Care – PPO

## 2019-05-18 ENCOUNTER — Encounter: Payer: Self-pay | Admitting: Podiatry

## 2019-05-18 ENCOUNTER — Other Ambulatory Visit: Payer: Self-pay

## 2019-05-18 VITALS — Temp 97.8°F

## 2019-05-18 DIAGNOSIS — L03119 Cellulitis of unspecified part of limb: Secondary | ICD-10-CM

## 2019-05-18 DIAGNOSIS — M86679 Other chronic osteomyelitis, unspecified ankle and foot: Secondary | ICD-10-CM | POA: Insufficient documentation

## 2019-05-18 DIAGNOSIS — L089 Local infection of the skin and subcutaneous tissue, unspecified: Secondary | ICD-10-CM

## 2019-05-18 DIAGNOSIS — L02619 Cutaneous abscess of unspecified foot: Secondary | ICD-10-CM | POA: Insufficient documentation

## 2019-05-18 DIAGNOSIS — M86672 Other chronic osteomyelitis, left ankle and foot: Secondary | ICD-10-CM

## 2019-05-18 DIAGNOSIS — S90822A Blister (nonthermal), left foot, initial encounter: Secondary | ICD-10-CM

## 2019-05-18 NOTE — Progress Notes (Signed)
This patient presents to the office with a blackened area over the outside top of her left foot.  She has a fluctuant fluid under the black cover.  She has redness and  swelling  around the dorsum of the black area on the outside of left foot.  She says she has been draining blood and pus for the last 5 days.  She has been soaking this foot  in epsom salts but problem persists.  This patient is diabetic on insulin.  She was initially treated on 11/09/18 and found to have an abscess on the dorsolateral aspect left foot.  Xrays were taken which revealed no bony pathology.  She was treated with cephalexin .  The lab  results from the culture revealed MRSA and when I called this patient she said the infection had resolved. She was to call the office as needed.    She was next seen on 11/18 and said the callus and foot was  painful.  We discussed taking additional x-rays but we chose to keep an eye on the foot instead.  During this November visit there was no evidence of drainage or cellulitis.  No xrays were  taken this visit.  Patient was seen again on 04/20/19 and said she had a localized draining wound at the porokeratotic lesion sub 5th metatarsal left foot.   Mild cellulitis was seen.  She was treated with Bactrim and soaks. She chose to have no x-ray taken .   She was told to return to the office in one week. She says her foot infection and pain resolved for three weeks and  she now returns 4 weeks later with draining wound left foot.    General Appearance  Alert, conversant and in no acute stress.  Vascular  Dorsalis pedis and posterior tibial  pulses are weakly  palpable  bilaterally.  Capillary return is within normal limits  bilaterally. Temperature is  Increased left foot. bilaterally.  Neurologic  Senn-Weinstein monofilament wire test within normal limits  bilaterally. Muscle power within normal limits bilaterally.  Nails Thick disfigured discolored nails with subungual debris  from hallux to fifth  toes bilaterally. No evidence of bacterial infection or drainage bilaterally.  Orthopedic  No limitations of motion  feet .  No crepitus or effusions noted.  No bony pathology or digital deformities noted.  Skin  .     Red swollen dorsum of left foot over the fifth ray left foot.  Patient has blackened skin covering fluctuant under the skin on the dorsum of fifth metatarsal left foot.  This black covering is noted at level of fifth metatarsal head left foot.  Porokeratosis sub 5th met left foot.  No drainage.    Osteomyelitis left foot fifth metatarsal head.  Cellulitis left foot dorsally.  Infected blister left foot.  ROV.  Examination of left foot was performed and an infection on the top of left foot with black  covering was noted   There was hematogenous  fluid noted under the black cover.  I did an initial I & D and significant bloody fluctuant was released from the abscess.  A culture was taken from left dorsal wound.  I told this patient that an xray would be taken today.  The x-ray revealed osteomyelitis fifth metatarsal head left foot.  At this time Dr.  Amalia Hailey was contacted to take over care of this patient.  He further debrided the wound and there was significant tunneling extending to the base fifth metatarsal left  foot.  This wound was then packed with iodoform  and a  bandage was applied to her left foot.  She was told to ambulate with surgical shoe.  Prescribe bactrim for this patient since she said this was helpful to clear up her infection.  [name]  She will be contacted by the lab for arterial duplex studies.  She is to see Dr.  Amalia Hailey this coming Monday for further discussion on future treatment.   Gardiner Barefoot DPM

## 2019-05-19 ENCOUNTER — Telehealth: Payer: Self-pay | Admitting: Podiatry

## 2019-05-19 ENCOUNTER — Telehealth: Payer: Self-pay | Admitting: *Deleted

## 2019-05-19 DIAGNOSIS — L02619 Cutaneous abscess of unspecified foot: Secondary | ICD-10-CM

## 2019-05-19 DIAGNOSIS — R0989 Other specified symptoms and signs involving the circulatory and respiratory systems: Secondary | ICD-10-CM

## 2019-05-19 DIAGNOSIS — L089 Local infection of the skin and subcutaneous tissue, unspecified: Secondary | ICD-10-CM

## 2019-05-19 DIAGNOSIS — I739 Peripheral vascular disease, unspecified: Secondary | ICD-10-CM

## 2019-05-19 DIAGNOSIS — S90822A Blister (nonthermal), left foot, initial encounter: Secondary | ICD-10-CM

## 2019-05-19 MED ORDER — SULFAMETHOXAZOLE-TRIMETHOPRIM 400-80 MG PO TABS: 1.0000 | ORAL_TABLET | Freq: Two times a day (BID) | ORAL | 0 refills | Status: DC

## 2019-05-19 NOTE — Telephone Encounter (Signed)
-----   Message from Edrick Kins, DPM sent at 05/18/2019  5:35 PM EST ----- Regarding: arterial doppler LLE Please order arterial doppler LLE.   Thanks, Dr. Amalia Hailey  Dx: PVD LLE. OM left foot.

## 2019-05-19 NOTE — Telephone Encounter (Signed)
Pt was seen in office yesterday for an infection and was supposed to have an antibiotic called into her pharmacy but they have not received it. Pt calling to follow up.  Pharmacy is [facility] [address]

## 2019-05-19 NOTE — Telephone Encounter (Signed)
I informed pt the antibiotic had been sent to the pharmacy [facility].

## 2019-05-19 NOTE — Telephone Encounter (Signed)
Faxed orders to [facility]. 

## 2019-05-19 NOTE — Telephone Encounter (Signed)
Dr. Prudence Davidson ordered Bactrim #20 one tablet bid.

## 2019-05-19 NOTE — Addendum Note (Signed)
Addended by: Harriett Sine D on: 05/19/2019 02:29 PM   Modules accepted: Orders

## 2019-05-23 ENCOUNTER — Inpatient Hospital Stay (HOSPITAL_COMMUNITY)
Admission: EM | Admit: 2019-05-23 | Discharge: 2019-05-30 | DRG: 475 | Disposition: A | Discharge status: Home Health | Payer: BC Managed Care – PPO | Source: Ambulatory Visit | Attending: Internal Medicine | Admitting: Internal Medicine

## 2019-05-23 ENCOUNTER — Other Ambulatory Visit: Payer: Self-pay

## 2019-05-23 ENCOUNTER — Ambulatory Visit: Payer: BC Managed Care – PPO | Admitting: Podiatry

## 2019-05-23 ENCOUNTER — Telehealth: Payer: Self-pay | Admitting: *Deleted

## 2019-05-23 ENCOUNTER — Encounter (HOSPITAL_COMMUNITY): Payer: Self-pay | Admitting: Emergency Medicine

## 2019-05-23 DIAGNOSIS — E669 Obesity, unspecified: Secondary | ICD-10-CM | POA: Diagnosis not present

## 2019-05-23 DIAGNOSIS — Z09 Encounter for follow-up examination after completed treatment for conditions other than malignant neoplasm: Secondary | ICD-10-CM

## 2019-05-23 DIAGNOSIS — E11621 Type 2 diabetes mellitus with foot ulcer: Secondary | ICD-10-CM | POA: Diagnosis present

## 2019-05-23 DIAGNOSIS — Z6835 Body mass index (BMI) 35.0-35.9, adult: Secondary | ICD-10-CM

## 2019-05-23 DIAGNOSIS — E1122 Type 2 diabetes mellitus with diabetic chronic kidney disease: Secondary | ICD-10-CM | POA: Diagnosis present

## 2019-05-23 DIAGNOSIS — I2510 Atherosclerotic heart disease of native coronary artery without angina pectoris: Secondary | ICD-10-CM

## 2019-05-23 DIAGNOSIS — E119 Type 2 diabetes mellitus without complications: Secondary | ICD-10-CM | POA: Diagnosis present

## 2019-05-23 DIAGNOSIS — E230 Hypopituitarism: Secondary | ICD-10-CM | POA: Diagnosis present

## 2019-05-23 DIAGNOSIS — N189 Chronic kidney disease, unspecified: Secondary | ICD-10-CM | POA: Diagnosis present

## 2019-05-23 DIAGNOSIS — E039 Hypothyroidism, unspecified: Secondary | ICD-10-CM | POA: Diagnosis present

## 2019-05-23 DIAGNOSIS — Z794 Long term (current) use of insulin: Secondary | ICD-10-CM

## 2019-05-23 DIAGNOSIS — I10 Essential (primary) hypertension: Secondary | ICD-10-CM | POA: Diagnosis not present

## 2019-05-23 DIAGNOSIS — L02619 Cutaneous abscess of unspecified foot: Secondary | ICD-10-CM

## 2019-05-23 DIAGNOSIS — E785 Hyperlipidemia, unspecified: Secondary | ICD-10-CM | POA: Diagnosis present

## 2019-05-23 DIAGNOSIS — D473 Essential (hemorrhagic) thrombocythemia: Secondary | ICD-10-CM | POA: Diagnosis present

## 2019-05-23 DIAGNOSIS — M86272 Subacute osteomyelitis, left ankle and foot: Secondary | ICD-10-CM | POA: Diagnosis present

## 2019-05-23 DIAGNOSIS — Z7982 Long term (current) use of aspirin: Secondary | ICD-10-CM

## 2019-05-23 DIAGNOSIS — E11649 Type 2 diabetes mellitus with hypoglycemia without coma: Secondary | ICD-10-CM | POA: Diagnosis not present

## 2019-05-23 DIAGNOSIS — Z8739 Personal history of other diseases of the musculoskeletal system and connective tissue: Secondary | ICD-10-CM

## 2019-05-23 DIAGNOSIS — L03116 Cellulitis of left lower limb: Secondary | ICD-10-CM | POA: Diagnosis present

## 2019-05-23 DIAGNOSIS — Z87891 Personal history of nicotine dependence: Secondary | ICD-10-CM

## 2019-05-23 DIAGNOSIS — Z89422 Acquired absence of other left toe(s): Secondary | ICD-10-CM | POA: Diagnosis not present

## 2019-05-23 DIAGNOSIS — M60074 Infective myositis, left foot: Secondary | ICD-10-CM | POA: Diagnosis present

## 2019-05-23 DIAGNOSIS — D631 Anemia in chronic kidney disease: Secondary | ICD-10-CM | POA: Diagnosis present

## 2019-05-23 DIAGNOSIS — M869 Osteomyelitis, unspecified: Secondary | ICD-10-CM | POA: Diagnosis present

## 2019-05-23 DIAGNOSIS — M86072 Acute hematogenous osteomyelitis, left ankle and foot: Secondary | ICD-10-CM | POA: Diagnosis not present

## 2019-05-23 DIAGNOSIS — E1151 Type 2 diabetes mellitus with diabetic peripheral angiopathy without gangrene: Secondary | ICD-10-CM | POA: Diagnosis present

## 2019-05-23 DIAGNOSIS — L97529 Non-pressure chronic ulcer of other part of left foot with unspecified severity: Secondary | ICD-10-CM | POA: Diagnosis not present

## 2019-05-23 DIAGNOSIS — L089 Local infection of the skin and subcutaneous tissue, unspecified: Secondary | ICD-10-CM

## 2019-05-23 DIAGNOSIS — Z91018 Allergy to other foods: Secondary | ICD-10-CM | POA: Diagnosis not present

## 2019-05-23 DIAGNOSIS — I959 Hypotension, unspecified: Secondary | ICD-10-CM | POA: Diagnosis present

## 2019-05-23 DIAGNOSIS — L0390 Cellulitis, unspecified: Secondary | ICD-10-CM | POA: Diagnosis not present

## 2019-05-23 DIAGNOSIS — E1140 Type 2 diabetes mellitus with diabetic neuropathy, unspecified: Secondary | ICD-10-CM | POA: Diagnosis present

## 2019-05-23 DIAGNOSIS — Z807 Family history of other malignant neoplasms of lymphoid, hematopoietic and related tissues: Secondary | ICD-10-CM

## 2019-05-23 DIAGNOSIS — I252 Old myocardial infarction: Secondary | ICD-10-CM

## 2019-05-23 DIAGNOSIS — L97524 Non-pressure chronic ulcer of other part of left foot with necrosis of bone: Secondary | ICD-10-CM | POA: Diagnosis present

## 2019-05-23 DIAGNOSIS — Z20822 Contact with and (suspected) exposure to covid-19: Secondary | ICD-10-CM | POA: Diagnosis present

## 2019-05-23 DIAGNOSIS — Z7952 Long term (current) use of systemic steroids: Secondary | ICD-10-CM

## 2019-05-23 DIAGNOSIS — Z9861 Coronary angioplasty status: Secondary | ICD-10-CM | POA: Diagnosis not present

## 2019-05-23 DIAGNOSIS — Z820 Family history of epilepsy and other diseases of the nervous system: Secondary | ICD-10-CM

## 2019-05-23 DIAGNOSIS — Z95820 Peripheral vascular angioplasty status with implants and grafts: Secondary | ICD-10-CM | POA: Diagnosis not present

## 2019-05-23 DIAGNOSIS — M86672 Other chronic osteomyelitis, left ankle and foot: Secondary | ICD-10-CM | POA: Diagnosis not present

## 2019-05-23 DIAGNOSIS — B9562 Methicillin resistant Staphylococcus aureus infection as the cause of diseases classified elsewhere: Secondary | ICD-10-CM | POA: Diagnosis present

## 2019-05-23 DIAGNOSIS — E1169 Type 2 diabetes mellitus with other specified complication: Secondary | ICD-10-CM | POA: Diagnosis present

## 2019-05-23 DIAGNOSIS — Z8249 Family history of ischemic heart disease and other diseases of the circulatory system: Secondary | ICD-10-CM

## 2019-05-23 DIAGNOSIS — I739 Peripheral vascular disease, unspecified: Secondary | ICD-10-CM

## 2019-05-23 DIAGNOSIS — N179 Acute kidney failure, unspecified: Secondary | ICD-10-CM | POA: Diagnosis present

## 2019-05-23 DIAGNOSIS — S90822A Blister (nonthermal), left foot, initial encounter: Secondary | ICD-10-CM

## 2019-05-23 DIAGNOSIS — I129 Hypertensive chronic kidney disease with stage 1 through stage 4 chronic kidney disease, or unspecified chronic kidney disease: Secondary | ICD-10-CM | POA: Diagnosis present

## 2019-05-23 DIAGNOSIS — L03119 Cellulitis of unspecified part of limb: Secondary | ICD-10-CM

## 2019-05-23 DIAGNOSIS — M00072 Staphylococcal arthritis, left ankle and foot: Secondary | ICD-10-CM | POA: Diagnosis present

## 2019-05-23 DIAGNOSIS — Z79890 Hormone replacement therapy (postmenopausal): Secondary | ICD-10-CM

## 2019-05-23 DIAGNOSIS — Z79899 Other long term (current) drug therapy: Secondary | ICD-10-CM

## 2019-05-23 DIAGNOSIS — Z955 Presence of coronary angioplasty implant and graft: Secondary | ICD-10-CM

## 2019-05-23 HISTORY — DX: Personal history of other diseases of the musculoskeletal system and connective tissue: Z87.39

## 2019-05-23 LAB — URINALYSIS, ROUTINE W REFLEX MICROSCOPIC
Bacteria, UA: NONE SEEN
Bilirubin Urine: NEGATIVE
Glucose, UA: >=500 mg/dL — AB
Hgb urine dipstick: NEGATIVE
Ketones, ur: NEGATIVE mg/dL
Leukocytes,Ua: NEGATIVE
Nitrite: NEGATIVE
Protein, ur: NEGATIVE mg/dL
Specific Gravity, Urine: 1.015 (ref 1.005–1.030)
pH: 5 (ref 5.0–8.0)

## 2019-05-23 LAB — CBC WITH DIFFERENTIAL/PLATELET
Abs Immature Granulocytes: 0.05 10*3/uL (ref 0.00–0.07)
Basophils Absolute: 0.1 10*3/uL (ref 0.0–0.1)
Basophils Relative: 1 %
Eosinophils Absolute: 0.1 10*3/uL (ref 0.0–0.5)
Eosinophils Relative: 1 %
HCT: 36.4 % (ref 36.0–46.0)
Hemoglobin: 11.2 g/dL — ABNORMAL LOW (ref 12.0–15.0)
Immature Granulocytes: 1 %
Lymphocytes Relative: 13 %
Lymphs Abs: 1.3 10*3/uL (ref 0.7–4.0)
MCH: 28.9 pg (ref 26.0–34.0)
MCHC: 30.8 g/dL (ref 30.0–36.0)
MCV: 94.1 fL (ref 80.0–100.0)
Monocytes Absolute: 0.8 10*3/uL (ref 0.1–1.0)
Monocytes Relative: 9 %
Neutro Abs: 7.1 10*3/uL (ref 1.7–7.7)
Neutrophils Relative %: 75 %
Platelets: 620 10*3/uL — ABNORMAL HIGH (ref 150–400)
RBC: 3.87 MIL/uL (ref 3.87–5.11)
RDW: 15 % (ref 11.5–15.5)
WBC: 9.4 10*3/uL (ref 4.0–10.5)
nRBC: 0 % (ref 0.0–0.2)

## 2019-05-23 LAB — COMPREHENSIVE METABOLIC PANEL
ALT: 16 U/L (ref 0–44)
AST: 16 U/L (ref 15–41)
Albumin: 2.8 g/dL — ABNORMAL LOW (ref 3.5–5.0)
Alkaline Phosphatase: 85 U/L (ref 38–126)
Anion gap: 13 (ref 5–15)
BUN: 23 mg/dL (ref 8–23)
CO2: 24 mmol/L (ref 22–32)
Calcium: 8.4 mg/dL — ABNORMAL LOW (ref 8.9–10.3)
Chloride: 100 mmol/L (ref 98–111)
Creatinine, Ser: 1.65 mg/dL — ABNORMAL HIGH (ref 0.44–1.00)
GFR calc Af Amer: 37 mL/min — ABNORMAL LOW (ref >60)
GFR calc non Af Amer: 32 mL/min — ABNORMAL LOW (ref >60)
Glucose, Bld: 237 mg/dL — ABNORMAL HIGH (ref 70–99)
Potassium: 4.1 mmol/L (ref 3.5–5.1)
Sodium: 137 mmol/L (ref 135–145)
Total Bilirubin: 0.6 mg/dL (ref 0.3–1.2)
Total Protein: 7.2 g/dL (ref 6.5–8.1)

## 2019-05-23 LAB — LACTIC ACID, PLASMA
Lactic Acid, Venous: 3.6 mmol/L (ref 0.5–1.9)
Lactic Acid, Venous: 2.9 mmol/L (ref 0.5–1.9)

## 2019-05-23 MED ORDER — SODIUM CHLORIDE 0.9 % IV BOLUS
1000.0000 mL | Freq: Once | INTRAVENOUS | Status: AC
  Administered 2019-05-23: 1000 mL via INTRAVENOUS

## 2019-05-23 MED ORDER — VANCOMYCIN HCL 2000 MG/400ML IV SOLN
2000.0000 mg | Freq: Once | INTRAVENOUS | Status: AC
  Administered 2019-05-24: 2000 mg via INTRAVENOUS
  Filled 2019-05-23: qty 400

## 2019-05-23 MED ORDER — PIPERACILLIN-TAZOBACTAM 3.375 G IVPB 30 MIN
3.3750 g | Freq: Once | INTRAVENOUS | Status: AC
  Administered 2019-05-23: 3.375 g via INTRAVENOUS
  Filled 2019-05-23: qty 50

## 2019-05-23 MED ORDER — SODIUM CHLORIDE 0.9% FLUSH: 3.0000 mL | Freq: Once | INTRAVENOUS | Status: DC

## 2019-05-23 NOTE — Progress Notes (Signed)
Subjective: 69 year old female the office today for follow-up evaluation of ulceration left foot with cellulitis.  She has been under the care of Dr. Prudence Davidson and apparently Dr. Amalia Hailey was involved in the care.  She presents today for further evaluation this is my first time seeing her today.  She states the bandages change and there is a new wound on the foot and she is very scared about her she is to have noticed systemic symptoms including fevers, chills, nausea, vomiting.  She is been on Bactrim.   Objective: AAO x3, NAD Decreased pedal pulses 2 large ulcerations present the lateral aspect of the foot and tendon exposed.  Small amount of purulence is expressed.  There is no fluctuance or crepitation.  There is no malodor. No pain with calf compression, swelling, warmth, erythema       Assessment: Worsening ulcerations left foot with PAD  Plan: -All treatment options discussed with the patient including all alternatives, risks, complications.  -X-rays confirmed osteomyelitis previously.  Given the significant worsening of the wounds over the last week I recommended her to go to the hospital for likely admission to hospital IV antibiotics.  She will need to have a vascular surgery consult and vascular labs.  I will also be happy to follow her while in the hospital. -Patient encouraged to call the office with any questions, concerns, change in symptoms.   Celesta Gentile, DPM O: [phone] C: [phone]

## 2019-05-23 NOTE — Telephone Encounter (Signed)
[name] sent pt to ED for open wounds to left foot. I informed [facility] of pt status and that she would be coming by private vehicle.

## 2019-05-23 NOTE — ED Provider Notes (Signed)
[facility] EMERGENCY DEPARTMENT Provider Note   CSN: [id number] Arrival date & time: 05/23/19  1533     History Chief Complaint  Patient presents with  . Wound Infection    Tammy Good is a 69 y.o. female.  69 yo F here for worsening wounds to her left foot.  Patient was seen by her podiatrist today and he realized that she had worsening ulcerative disease.  She previously had been seen in the office about a week ago and started on antibiotics and found to have osteo on plain film.  Since she was worsening was sent here for admission and IV antibiotics.  Patient denies any fevers denies any leg swelling.  The history is provided by the patient.  Illness Severity:  Moderate Onset quality:  Gradual Duration:  2 weeks Timing:  Constant Progression:  Worsening Chronicity:  New Associated symptoms: no chest pain, no congestion, no fever, no headaches, no myalgias, no nausea, no rhinorrhea, no shortness of breath, no vomiting and no wheezing        Past Medical History:  Diagnosis Date  . CAD S/P percutaneous coronary angioplasty 08/2013   100% mRCA - PCI Integrity Resolute DES 3.0 mm x 38 mm - 3.35 mm; PTCA of RPA V 2.0 mm x 15 mm  . Cholesteatoma    right  . Diabetes mellitus type 2 in obese (HCC)    On insulin and Invokana  . Hyperlipidemia with target LDL less than 70   . Hypothyroidism (acquired)   . Mild essential hypertension   . Obesity (BMI 30-39.9) 11/17/2013  . Panhypopituitarism ([place])   . ST elevation myocardial infarction (STEMI) of inferior wall, subsequent episode of care ([place]) 08/2013   80% branch of D1, 40% mid AV groove circumflex, 100% RCA with subacute thrombus -- thrombus extending into RPA V with 100% occlusion after initial angioplasty of mid RCA ;; Post MI ECHO 6/9/'15: EF 50-55%, mild LVH with moderate HK of inferior wall, Gr1 DD, mild LA dilation; mildly reduced RV function  . Status post insertion of drug eluting coronary artery  stent to RCA emergently 09/16/2013    Patient Active Problem List   Diagnosis Date Noted  . Osteomyelitis ([place]) 05/23/2019  . Cellulitis and abscess of foot, except toes 05/18/2019  . Chronic osteomyelitis of ankle and foot ([place]) 05/18/2019  . Cellulitis and abscess of toe 11/09/2018  . Pre-operative cardiovascular examination 11/30/2017  . Iron deficiency anemia 12/10/2014  . Obesity (BMI 30-39.9) 11/17/2013  . Diabetes mellitus type 2 in obese ([place])   . Essential hypertension   . Right thigh pain 11/01/2013  . PVC's (premature ventricular contractions) 11/01/2013  . Status post insertion of drug eluting coronary artery stent to [facility] emergently and PTCA to prox. PLA 09/16/2013  . Hyperlipidemia LDL goal <70 09/16/2013  . Panhypopituitarism ([place]) 08/30/2013  . ST elevation myocardial infarction (STEMI) of inferior wall, subsequent episode of care ([place]) 08/22/2013  . CAD S/P percutaneous coronary angioplasty 08/22/2013    Past Surgical History:  Procedure Laterality Date  . Cardiac Event Monitor  July-August 2015   Sinus rhythm with PVCs  . COLONOSCOPY N/A 08/31/2013   Procedure: COLONOSCOPY;  Surgeon: Juanita Craver, MD;  Location: [facility] ENDOSCOPY;  Service: Endoscopy;  Laterality: N/A;  . ESOPHAGOGASTRODUODENOSCOPY N/A 09/01/2013   Procedure: ESOPHAGOGASTRODUODENOSCOPY (EGD);  Surgeon: Beryle Beams, MD;  Location: [facility] ENDOSCOPY;  Service: Endoscopy;  Laterality: N/A;  bedside  . LEFT HEART CATHETERIZATION WITH CORONARY ANGIOGRAM N/A 08/30/2013  Procedure: LEFT HEART CATHETERIZATION WITH CORONARY ANGIOGRAM;  Surgeon: Leonie Man, MD;  Location: [facility] CATH LAB: 100% mRCA (thrombus - extends to RPAV), 80% D1, 40% AVG Cx.  [name] PERCUTANEOUS CORONARY STENT INTERVENTION (PCI-S)  08/30/2013   Procedure: PERCUTANEOUS CORONARY STENT INTERVENTION (PCI-S);  Surgeon: Leonie Man, MD;  Location: [facility] CATH LAB;  Integrity Resolute DES 2.0 mm x 38 mm -- 3.35 mm.; PTCA of proximal RPA V. - 2.0 mm x 15 mm  balloon  . PITUITARY SURGERY    . TRANSTHORACIC ECHOCARDIOGRAM  08/30/2013   mild LVH. EF 50-55%. Moderate HK of the entire inferior myocardium. GR 1 DD. Mild LA dilation. Mildly reduced RV function  . TYMPANOMASTOIDECTOMY Right 12/28/2017   Procedure: RIGHT TYMPANOMASTOIDECTOMY;  Surgeon: Leta Baptist, MD;  Location: [facility];  Service: ENT;  Laterality: Right;     OB History   No obstetric history on file.     Family History  Problem Relation Age of Onset  . Cancer Mother 40       multiple myeloma  . Heart attack Father 39  . Cancer Sister   . Alzheimer's disease Maternal Grandmother     Social History   Tobacco Use  . Smoking status: Former Research scientist (life sciences)  . Smokeless tobacco: Never Used  Substance Use Topics  . Alcohol use: No  . Drug use: No    Home Medications Prior to Admission medications   Medication Sig Start Date End Date Taking? Authorizing Provider  atorvastatin (LIPITOR) 40 MG tablet Take 1 tablet (40 mg total) by mouth daily at 6 PM. 09/03/13   Cherene Altes, MD  dexamethasone (DECADRON) 0.5 MG tablet Take 0.5 mg by mouth daily.  09/03/13   Cherene Altes, MD  FARXIGA 10 MG TABS tablet  07/25/15   [provider]  hydrochlorothiazide (HYDRODIURIL) 25 MG tablet Take 1 tablet (25 mg total) by mouth daily. 12/22/18 03/22/19  Leonie Man, MD  Insulin Glargine [facility] KWIKPEN) 100 UNIT/ML SOPN Inject 52 Units into the skin at bedtime. inject 52 units into skin at bedtime 07/11/15   [provider]  iron polysaccharides (FERREX 150) 150 MG capsule Take 150 mg by mouth daily.    [provider]  levothyroxine (SYNTHROID, LEVOTHROID) 175 MCG tablet Take 175 mcg by mouth daily before breakfast.    [provider]  lisinopril (PRINIVIL,ZESTRIL) 20 MG tablet Take 1/2 tablet by mouth daily (start on 11/17/13) 09/03/13   Cherene Altes, MD  metoprolol tartrate (LOPRESSOR) 25 MG tablet TAKE 1 TABLET TWICE A DAY 02/04/17    Leonie Man, MD  NOVOLOG FLEXPEN 100 UNIT/ML FlexPen  01/03/19   [provider]  ONETOUCH VERIO test strip USE 1 STRIP TO [place] DAILY 12/24/17   [provider]  pantoprazole (PROTONIX) 40 MG tablet Take 1 tablet (40 mg total) by mouth 2 (two) times daily before a meal. 09/03/13   Cherene Altes, MD  sulfamethoxazole-trimethoprim (BACTRIM) 400-80 MG tablet Take 1 tablet by mouth 2 (two) times daily. 05/19/19   Gardiner Barefoot, DPM    Allergies    Strawberry extract  Review of Systems   Review of Systems  Constitutional: Negative for chills and fever.  HENT: Negative for congestion and rhinorrhea.   Eyes: Negative for redness and visual disturbance.  Respiratory: Negative for shortness of breath and wheezing.   Cardiovascular: Negative for chest pain and palpitations.  Gastrointestinal: Negative for nausea and vomiting.  Genitourinary: Negative  for dysuria and urgency.  Musculoskeletal: Negative for arthralgias and myalgias.  Skin: Positive for wound. Negative for pallor.  Neurological: Negative for dizziness and headaches.    Physical Exam Updated Vital Signs BP (!) 109/46   Pulse 64   Temp 98.3 F (36.8 C) (Oral)   Resp 18   SpO2 98%   Physical Exam Vitals and nursing note reviewed.  Constitutional:      General: She is not in acute distress.    Appearance: She is well-developed. She is not diaphoretic.  HENT:     Head: Normocephalic and atraumatic.  Eyes:     Pupils: Pupils are equal, round, and reactive to light.  Cardiovascular:     Rate and Rhythm: Normal rate and regular rhythm.     Heart sounds: No murmur. No friction rub. No gallop.   Pulmonary:     Effort: Pulmonary effort is normal.     Breath sounds: No wheezing or rales.  Abdominal:     General: There is no distension.     Palpations: Abdomen is soft.     Tenderness: There is no abdominal tenderness.  Musculoskeletal:        General: No tenderness.      Cervical back: Normal range of motion and neck supple.     Comments: 2 ulcers to the lateral aspect of the dorsal aspect of the left foot.  Some granulation tissue is noted.  No obvious purulence on my exam.  Foot is warm to touch cap refill is 3 seconds.  Palpable posterior tibialis.  Intact sensation and motor.  Skin:    General: Skin is warm and dry.  Neurological:     Mental Status: She is alert and oriented to person, place, and time.  Psychiatric:        Behavior: Behavior normal.     ED Results / Procedures / Treatments   Labs (all labs ordered are listed, but only abnormal results are displayed) Labs Reviewed  LACTIC ACID, PLASMA - Abnormal; Notable for the following components:      Result Value   Lactic Acid, Venous 3.6 (*)    All other components within normal limits  LACTIC ACID, PLASMA - Abnormal; Notable for the following components:   Lactic Acid, Venous 2.9 (*)    All other components within normal limits  COMPREHENSIVE METABOLIC PANEL - Abnormal; Notable for the following components:   Glucose, Bld 237 (*)    Creatinine, Ser 1.65 (*)    Calcium 8.4 (*)    Albumin 2.8 (*)    GFR calc non Af Amer 32 (*)    GFR calc Af Amer 37 (*)    All other components within normal limits  URINALYSIS, ROUTINE W REFLEX MICROSCOPIC - Abnormal; Notable for the following components:   Glucose, UA >=500 (*)    All other components within normal limits  CBC WITH DIFFERENTIAL/PLATELET - Abnormal; Notable for the following components:   Hemoglobin 11.2 (*)    Platelets 620 (*)    All other components within normal limits  CULTURE, BLOOD (ROUTINE X 2)  CULTURE, BLOOD (ROUTINE X 2)  SARS CORONAVIRUS 2 (TAT 6-24 HRS)  CBC WITH DIFFERENTIAL/PLATELET    EKG None  Radiology No results found.  Procedures Procedures (including critical care time)  Medications Ordered in ED Medications  sodium chloride flush (NS) 0.9 % injection 3 mL (has no administration in time range)    vancomycin (VANCOREADY) IVPB 2000 mg/400 mL (has no administration in time range)  piperacillin-tazobactam (ZOSYN) IVPB 3.375 g (3.375 g Intravenous New Bag/Given 05/23/19 2244)  sodium chloride 0.9 % bolus 1,000 mL (1,000 mLs Intravenous New Bag/Given 05/23/19 2243)    ED Course  I have reviewed the triage vital signs and the nursing notes.  Pertinent labs & imaging results that were available during my care of the patient were reviewed by me and considered in my medical decision making (see chart for details).    MDM Rules/Calculators/A&P                      69 yo F sent here from the podiatry office for admission and IV antibiotics after failure of outpatient therapy for osteomyelitis.  Patient otherwise is well-appearing and nontoxic.  She did have a significantly elevated lactate that resolved without intervention.  Was afebrile here.  Sent off blood cultures broad-spectrum antibiotics.  Initial blood pressure was also in the 90s though never below 90 systolic and maps never less than 65.  Discussed with medicine for admission.  The patients results and plan were reviewed and discussed.   Any x-rays performed were independently reviewed by myself.   Differential diagnosis were considered with the presenting HPI.  Medications  sodium chloride flush (NS) 0.9 % injection 3 mL (has no administration in time range)  vancomycin (VANCOREADY) IVPB 2000 mg/400 mL (has no administration in time range)  piperacillin-tazobactam (ZOSYN) IVPB 3.375 g (3.375 g Intravenous New Bag/Given 05/23/19 2244)  sodium chloride 0.9 % bolus 1,000 mL (1,000 mLs Intravenous New Bag/Given 05/23/19 2243)    Vitals:   05/23/19 1543 05/23/19 2200  BP: (!) 98/58 (!) 109/46  Pulse: 90 64  Resp: 18   Temp: 98.3 F (36.8 C)   TempSrc: Oral   SpO2: 98% 98%    Final diagnoses:  Subacute osteomyelitis of left foot (HCC)    Admission/ observation were discussed with the admitting physician, patient and/or family  and they are comfortable with the plan.   Final Clinical Impression(s) / ED Diagnoses Final diagnoses:  Subacute osteomyelitis of left foot [facility])    Rx / DC Orders ED Discharge Orders    None       Deno Etienne, DO 05/23/19 2339

## 2019-05-23 NOTE — ED Notes (Signed)
Date and time results received: 05/23/19 1655 (use smartphrase ".now" to insert current time)  Test: Lactic Critical Value: 3.6  Name of Provider Notified: Deno Etienne, MD   Orders Received? Or Actions Taken?: None

## 2019-05-23 NOTE — ED Triage Notes (Signed)
Pt arrives to ED for progressive worsening of two wound to left foot per note pt has " 2 large ulcerations present the lateral aspect of the foot and tendon exposed". Pt states this wound started as a blister a few weeks ago and has progressively became open and draining purulent discharge.

## 2019-05-24 ENCOUNTER — Encounter (HOSPITAL_COMMUNITY): Payer: Self-pay | Admitting: Internal Medicine

## 2019-05-24 ENCOUNTER — Encounter (HOSPITAL_COMMUNITY): Payer: BC Managed Care – PPO

## 2019-05-24 ENCOUNTER — Inpatient Hospital Stay (HOSPITAL_COMMUNITY): Payer: BC Managed Care – PPO

## 2019-05-24 DIAGNOSIS — E669 Obesity, unspecified: Secondary | ICD-10-CM

## 2019-05-24 DIAGNOSIS — M869 Osteomyelitis, unspecified: Secondary | ICD-10-CM | POA: Diagnosis present

## 2019-05-24 DIAGNOSIS — I10 Essential (primary) hypertension: Secondary | ICD-10-CM

## 2019-05-24 DIAGNOSIS — M86672 Other chronic osteomyelitis, left ankle and foot: Secondary | ICD-10-CM

## 2019-05-24 LAB — CBC
HCT: 31.3 % — ABNORMAL LOW (ref 36.0–46.0)
Hemoglobin: 9.8 g/dL — ABNORMAL LOW (ref 12.0–15.0)
MCH: 28.6 pg (ref 26.0–34.0)
MCHC: 31.3 g/dL (ref 30.0–36.0)
MCV: 91.3 fL (ref 80.0–100.0)
Platelets: 518 10*3/uL — ABNORMAL HIGH (ref 150–400)
RBC: 3.43 MIL/uL — ABNORMAL LOW (ref 3.87–5.11)
RDW: 14.8 % (ref 11.5–15.5)
WBC: 9.2 10*3/uL (ref 4.0–10.5)
nRBC: 0 % (ref 0.0–0.2)

## 2019-05-24 LAB — GLUCOSE, CAPILLARY
Glucose-Capillary: 64 mg/dL — ABNORMAL LOW (ref 70–99)
Glucose-Capillary: 111 mg/dL — ABNORMAL HIGH (ref 70–99)
Glucose-Capillary: 123 mg/dL — ABNORMAL HIGH (ref 70–99)
Glucose-Capillary: 162 mg/dL — ABNORMAL HIGH (ref 70–99)
Glucose-Capillary: 190 mg/dL — ABNORMAL HIGH (ref 70–99)

## 2019-05-24 LAB — PROTEIN / CREATININE RATIO, URINE
Creatinine, Urine: 51.72 mg/dL
Protein Creatinine Ratio: 0.21 mg/mg{Cre} — ABNORMAL HIGH (ref 0.00–0.15)
Total Protein, Urine: 11 mg/dL

## 2019-05-24 LAB — HIV ANTIBODY (ROUTINE TESTING W REFLEX): HIV Screen 4th Generation wRfx: NONREACTIVE

## 2019-05-24 LAB — LACTIC ACID, PLASMA
Lactic Acid, Venous: 2.4 mmol/L (ref 0.5–1.9)
Lactic Acid, Venous: 1.4 mmol/L (ref 0.5–1.9)

## 2019-05-24 LAB — URINALYSIS, ROUTINE W REFLEX MICROSCOPIC
Bilirubin Urine: NEGATIVE
Glucose, UA: 150 mg/dL — AB
Hgb urine dipstick: NEGATIVE
Ketones, ur: NEGATIVE mg/dL
Leukocytes,Ua: NEGATIVE
Nitrite: NEGATIVE
Protein, ur: NEGATIVE mg/dL
Specific Gravity, Urine: 1.012 (ref 1.005–1.030)
pH: 5 (ref 5.0–8.0)

## 2019-05-24 LAB — SARS CORONAVIRUS 2 (TAT 6-24 HRS): SARS Coronavirus 2: NEGATIVE

## 2019-05-24 LAB — BASIC METABOLIC PANEL
Anion gap: 12 (ref 5–15)
BUN: 26 mg/dL — ABNORMAL HIGH (ref 8–23)
CO2: 21 mmol/L — ABNORMAL LOW (ref 22–32)
Calcium: 7.9 mg/dL — ABNORMAL LOW (ref 8.9–10.3)
Chloride: 102 mmol/L (ref 98–111)
Creatinine, Ser: 1.82 mg/dL — ABNORMAL HIGH (ref 0.44–1.00)
GFR calc Af Amer: 33 mL/min — ABNORMAL LOW (ref >60)
GFR calc non Af Amer: 28 mL/min — ABNORMAL LOW (ref >60)
Glucose, Bld: 160 mg/dL — ABNORMAL HIGH (ref 70–99)
Potassium: 4.1 mmol/L (ref 3.5–5.1)
Sodium: 135 mmol/L (ref 135–145)

## 2019-05-24 LAB — SURGICAL PCR SCREEN
MRSA, PCR: POSITIVE — AB
Staphylococcus aureus: POSITIVE — AB

## 2019-05-24 LAB — WOUND CULTURE
MICRO NUMBER:: 10193652
SPECIMEN QUALITY:: ADEQUATE

## 2019-05-24 LAB — TROPONIN I (HIGH SENSITIVITY): Troponin I (High Sensitivity): 4 ng/L (ref <18)

## 2019-05-24 LAB — CREATININE, URINE, RANDOM: Creatinine, Urine: 52.26 mg/dL

## 2019-05-24 LAB — PROCALCITONIN: Procalcitonin: <0.1 ng/mL

## 2019-05-24 LAB — SODIUM, URINE, RANDOM: Sodium, Ur: 83 mmol/L

## 2019-05-24 MED ORDER — FERROUS SULFATE 325 (65 FE) MG PO TABS
325.0000 mg | ORAL_TABLET | Freq: Every day | ORAL | Status: DC
  Administered 2019-05-24 – 2019-05-30 (×5): 325 mg via ORAL
  Filled 2019-05-24 (×5): qty 1

## 2019-05-24 MED ORDER — BACID PO TABS: 2.0000 | ORAL_TABLET | Freq: Three times a day (TID) | ORAL | Status: DC

## 2019-05-24 MED ORDER — CHLORHEXIDINE GLUCONATE CLOTH 2 % EX PADS
6.0000 | MEDICATED_PAD | Freq: Every day | CUTANEOUS | Status: AC
  Administered 2019-05-25 – 2019-05-29 (×4): 6 via TOPICAL

## 2019-05-24 MED ORDER — INSULIN GLARGINE 100 UNIT/ML ~~LOC~~ SOLN
45.0000 [IU] | Freq: Every day | SUBCUTANEOUS | Status: DC
  Administered 2019-05-24: 45 [IU] via SUBCUTANEOUS
  Filled 2019-05-24 (×2): qty 0.45

## 2019-05-24 MED ORDER — ONDANSETRON HCL 4 MG/2ML IJ SOLN
4.0000 mg | Freq: Four times a day (QID) | INTRAMUSCULAR | Status: DC | PRN
  Administered 2019-05-25: 4 mg via INTRAVENOUS

## 2019-05-24 MED ORDER — VANCOMYCIN HCL 1750 MG/350ML IV SOLN: 1750.0000 mg | INTRAVENOUS | Status: DC

## 2019-05-24 MED ORDER — ONDANSETRON HCL 4 MG PO TABS: 4.0000 mg | ORAL_TABLET | Freq: Four times a day (QID) | ORAL | Status: DC | PRN

## 2019-05-24 MED ORDER — PANTOPRAZOLE SODIUM 40 MG PO TBEC
40.0000 mg | DELAYED_RELEASE_TABLET | Freq: Two times a day (BID) | ORAL | Status: DC
  Administered 2019-05-24 – 2019-05-30 (×13): 40 mg via ORAL
  Filled 2019-05-24 (×13): qty 1

## 2019-05-24 MED ORDER — ACETAMINOPHEN 650 MG RE SUPP: 650.0000 mg | Freq: Four times a day (QID) | RECTAL | Status: DC | PRN

## 2019-05-24 MED ORDER — INSULIN GLARGINE 100 UNIT/ML ~~LOC~~ SOLN
20.0000 [IU] | Freq: Every day | SUBCUTANEOUS | Status: DC
  Administered 2019-05-24 – 2019-05-29 (×6): 20 [IU] via SUBCUTANEOUS
  Filled 2019-05-24 (×8): qty 0.2

## 2019-05-24 MED ORDER — VANCOMYCIN HCL 1750 MG/350ML IV SOLN
1750.0000 mg | INTRAVENOUS | Status: DC
  Administered 2019-05-25: 1750 mg via INTRAVENOUS
  Filled 2019-05-24: qty 350

## 2019-05-24 MED ORDER — METOPROLOL TARTRATE 25 MG PO TABS: 25.0000 mg | ORAL_TABLET | Freq: Two times a day (BID) | ORAL | Status: DC

## 2019-05-24 MED ORDER — OMEGA-3-ACID ETHYL ESTERS 1 G PO CAPS
1.0000 g | ORAL_CAPSULE | Freq: Every day | ORAL | Status: DC
  Administered 2019-05-24 – 2019-05-30 (×5): 1 g via ORAL
  Filled 2019-05-24 (×5): qty 1

## 2019-05-24 MED ORDER — BACID PO TABS
2.0000 | ORAL_TABLET | Freq: Two times a day (BID) | ORAL | Status: DC
  Administered 2019-05-24 – 2019-05-30 (×12): 2 via ORAL
  Filled 2019-05-24 (×14): qty 2

## 2019-05-24 MED ORDER — HEPARIN SODIUM (PORCINE) 5000 UNIT/ML IJ SOLN
5000.0000 [IU] | Freq: Three times a day (TID) | INTRAMUSCULAR | Status: DC
  Administered 2019-05-24 – 2019-05-30 (×17): 5000 [IU] via SUBCUTANEOUS
  Filled 2019-05-24 (×17): qty 1

## 2019-05-24 MED ORDER — METRONIDAZOLE IN NACL 5-0.79 MG/ML-% IV SOLN
500.0000 mg | Freq: Three times a day (TID) | INTRAVENOUS | Status: DC
  Administered 2019-05-24 – 2019-05-29 (×15): 500 mg via INTRAVENOUS
  Filled 2019-05-24 (×15): qty 100

## 2019-05-24 MED ORDER — DEXAMETHASONE 0.5 MG PO TABS
0.5000 mg | ORAL_TABLET | Freq: Every day | ORAL | Status: DC
  Administered 2019-05-24 – 2019-05-30 (×6): 0.5 mg via ORAL
  Filled 2019-05-24 (×7): qty 1

## 2019-05-24 MED ORDER — HYDROCORTISONE NA SUCCINATE PF 100 MG IJ SOLR
50.0000 mg | Freq: Once | INTRAMUSCULAR | Status: AC
  Administered 2019-05-24: 50 mg via INTRAVENOUS
  Filled 2019-05-24: qty 1

## 2019-05-24 MED ORDER — METOPROLOL TARTRATE 25 MG PO TABS: 12.5000 mg | ORAL_TABLET | Freq: Two times a day (BID) | ORAL | Status: DC

## 2019-05-24 MED ORDER — MUPIROCIN 2 % EX OINT
1.0000 "application " | TOPICAL_OINTMENT | Freq: Two times a day (BID) | CUTANEOUS | Status: AC
  Administered 2019-05-24 – 2019-05-29 (×9): 1 via NASAL
  Filled 2019-05-24 (×2): qty 22

## 2019-05-24 MED ORDER — ASPIRIN EC 81 MG PO TBEC
81.0000 mg | DELAYED_RELEASE_TABLET | Freq: Every day | ORAL | Status: DC
  Administered 2019-05-24 – 2019-05-30 (×5): 81 mg via ORAL
  Filled 2019-05-24 (×5): qty 1

## 2019-05-24 MED ORDER — INSULIN ASPART 100 UNIT/ML ~~LOC~~ SOLN
0.0000 [IU] | Freq: Three times a day (TID) | SUBCUTANEOUS | Status: DC
  Administered 2019-05-24: 2 [IU] via SUBCUTANEOUS
  Administered 2019-05-24: 1 [IU] via SUBCUTANEOUS
  Administered 2019-05-25 – 2019-05-26 (×2): 3 [IU] via SUBCUTANEOUS
  Administered 2019-05-27: 2 [IU] via SUBCUTANEOUS
  Administered 2019-05-27: 1 [IU] via SUBCUTANEOUS
  Administered 2019-05-28: 3 [IU] via SUBCUTANEOUS
  Administered 2019-05-29 – 2019-05-30 (×3): 1 [IU] via SUBCUTANEOUS
  Administered 2019-05-30: 2 [IU] via SUBCUTANEOUS

## 2019-05-24 MED ORDER — PIPERACILLIN-TAZOBACTAM 3.375 G IVPB
3.3750 g | Freq: Three times a day (TID) | INTRAVENOUS | Status: DC
  Administered 2019-05-24: 3.375 g via INTRAVENOUS
  Filled 2019-05-24: qty 50

## 2019-05-24 MED ORDER — ATORVASTATIN CALCIUM 40 MG PO TABS
40.0000 mg | ORAL_TABLET | Freq: Every day | ORAL | Status: DC
  Administered 2019-05-24 – 2019-05-30 (×5): 40 mg via ORAL
  Filled 2019-05-24 (×5): qty 1

## 2019-05-24 MED ORDER — ACETAMINOPHEN 325 MG PO TABS: 650.0000 mg | ORAL_TABLET | Freq: Four times a day (QID) | ORAL | Status: DC | PRN

## 2019-05-24 MED ORDER — SODIUM CHLORIDE 0.9 % IV BOLUS
1000.0000 mL | Freq: Once | INTRAVENOUS | Status: AC
  Administered 2019-05-24: 1000 mL via INTRAVENOUS

## 2019-05-24 MED ORDER — CHLORHEXIDINE GLUCONATE CLOTH 2 % EX PADS: 6.0000 | MEDICATED_PAD | Freq: Once | CUTANEOUS | Status: AC

## 2019-05-24 MED ORDER — SODIUM CHLORIDE 0.9 % IV SOLN: INTRAVENOUS | Status: AC

## 2019-05-24 MED ORDER — CHLORHEXIDINE GLUCONATE CLOTH 2 % EX PADS
6.0000 | MEDICATED_PAD | Freq: Once | CUTANEOUS | Status: AC
  Administered 2019-05-24: 6 via TOPICAL

## 2019-05-24 MED ORDER — HYDRALAZINE HCL 20 MG/ML IJ SOLN: 10.0000 mg | INTRAMUSCULAR | Status: DC | PRN

## 2019-05-24 MED ORDER — LEVOTHYROXINE SODIUM 75 MCG PO TABS
175.0000 ug | ORAL_TABLET | Freq: Every day | ORAL | Status: DC
  Administered 2019-05-24 – 2019-05-30 (×6): 175 ug via ORAL
  Filled 2019-05-24 (×6): qty 1

## 2019-05-24 MED ORDER — LISINOPRIL 20 MG PO TABS: 20.0000 mg | ORAL_TABLET | Freq: Every day | ORAL | Status: DC

## 2019-05-24 MED ORDER — SODIUM CHLORIDE 0.9 % IV SOLN
2.0000 g | INTRAVENOUS | Status: DC
  Administered 2019-05-24 – 2019-05-29 (×5): 2 g via INTRAVENOUS
  Filled 2019-05-24 (×2): qty 2
  Filled 2019-05-24 (×2): qty 20
  Filled 2019-05-24: qty 2

## 2019-05-24 NOTE — H&P (Signed)
History and Physical    Tammy Good:[id number] DOB: 22-May-1950 DOA: 05/23/2019  PCP: [name], MD  Patient coming from: Home.  Chief Complaint: Left action.  HPI: Tammy Good is a 69 y.o. 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  Patient states she developed a small blister about a month ago and was given 2 course of antibiotics antibiotics.  Over the last 1 week the blisters opened and an ulcer was found.  Yesterday patient had gone to follow-up with her podiatrist and when patient was having some systemic signs concerning for infection.  Patient was referred to the ER.  Per report recent x-rays were showing features concerning for osteomyelitis results of which MRI would to have access.  ED Course: In the ER patient's blood pressure was in the low normal with lactate being around 3.6.  Which improved to 2.9 after fluid bolus.  Patient's creatinine increased from normal to 1.6 patient had blood cultures drawn and started on empiric antibiotics for possible loading sepsis from left foot cellulitis and possible osteomyelitis.  Covid test is pending.  CBC shows hemoglobin of 11.2 platelets 620.  Review of Systems: As per HPI, rest all negative.   Past Medical History:  Diagnosis Date  . CAD S/P percutaneous coronary angioplasty 08/2013   100% mRCA - PCI Integrity Resolute DES 3.0 mm x 38 mm - 3.35 mm; PTCA of RPA V 2.0 mm x 15 mm  . Cholesteatoma    right  . Diabetes mellitus type 2 in obese (HCC)    On insulin and Invokana  . Hyperlipidemia with target LDL less than 70   . Hypothyroidism (acquired)   . Mild essential hypertension   . Obesity (BMI 30-39.9) 11/17/2013  . Panhypopituitarism ([place])   . ST elevation myocardial infarction (STEMI) of  inferior wall, subsequent episode of care ([place]) 08/2013   80% branch of D1, 40% mid AV groove circumflex, 100% RCA with subacute thrombus -- thrombus extending into RPA V with 100% occlusion after initial angioplasty of mid RCA ;; Post MI ECHO 6/9/'15: EF 50-55%, mild LVH with moderate HK of inferior wall, Gr1 DD, mild LA dilation; mildly reduced RV function  . Status post insertion of drug eluting coronary artery stent to RCA emergently 09/16/2013    Past Surgical History:  Procedure Laterality Date  . Cardiac Event Monitor  July-August 2015   Sinus rhythm with PVCs  . COLONOSCOPY N/A 08/31/2013   Procedure: COLONOSCOPY;  Surgeon: Juanita Craver, MD;  Location: [facility] ENDOSCOPY;  Service: Endoscopy;  Laterality: N/A;  . ESOPHAGOGASTRODUODENOSCOPY N/A 09/01/2013   Procedure: ESOPHAGOGASTRODUODENOSCOPY (EGD);  Surgeon: Beryle Beams, MD;  Location: [facility] ENDOSCOPY;  Service: Endoscopy;  Laterality: N/A;  bedside  . LEFT HEART CATHETERIZATION WITH CORONARY ANGIOGRAM N/A 08/30/2013   Procedure: LEFT HEART CATHETERIZATION WITH CORONARY ANGIOGRAM;  Surgeon: Leonie Man, MD;  Location: [facility] CATH LAB: 100% mRCA (thrombus - extends to RPAV), 80% D1, 40% AVG Cx.  [name] PERCUTANEOUS CORONARY STENT INTERVENTION (PCI-S)  08/30/2013   Procedure: PERCUTANEOUS CORONARY STENT INTERVENTION (PCI-S);  Surgeon: Leonie Man, MD;  Location: [facility] CATH LAB;  Integrity Resolute DES 2.0 mm x 38 mm -- 3.35 mm.; PTCA of proximal RPA V. - 2.0 mm x 15 mm balloon  . PITUITARY SURGERY    .  TRANSTHORACIC ECHOCARDIOGRAM  08/30/2013   mild LVH. EF 50-55%. Moderate HK of the entire inferior myocardium. GR 1 DD. Mild LA dilation. Mildly reduced RV function  . TYMPANOMASTOIDECTOMY Right 12/28/2017   Procedure: RIGHT TYMPANOMASTOIDECTOMY;  Surgeon: Leta Baptist, MD;  Location: [place];  Service: ENT;  Laterality: Right;     reports that she has quit smoking. She has never used smokeless tobacco. She reports that she does not drink  alcohol or use drugs.  Allergies  Allergen Reactions  . Strawberry Extract Itching, Swelling and Anaphylaxis    Mouth swells and gets itchy    Family History  Problem Relation Age of Onset  . Cancer Mother 45       multiple myeloma  . Heart attack Father 36  . Cancer Sister   . Alzheimer's disease Maternal Grandmother     Prior to Admission medications   Medication Sig Start Date End Date Taking? Authorizing Provider  aspirin EC 81 MG tablet Take 81 mg by mouth daily.   Yes [provider]  atorvastatin (LIPITOR) 40 MG tablet Take 1 tablet (40 mg total) by mouth daily at 6 PM. Patient taking differently: Take 40 mg by mouth every morning.  09/03/13  Yes Cherene Altes, MD  dexamethasone (DECADRON) 0.5 MG tablet Take 0.5 mg by mouth daily.  09/03/13  Yes Cherene Altes, MD  FARXIGA 10 MG TABS tablet Take 10 mg by mouth daily.  07/25/15  Yes [provider]  ferrous sulfate 325 (65 FE) MG tablet Take 325 mg by mouth daily with breakfast.   Yes [provider]  hydrochlorothiazide (HYDRODIURIL) 25 MG tablet Take 25 mg by mouth daily.   Yes [provider]  Insulin Glargine (BASAGLAR KWIKPEN) 100 UNIT/ML SOPN Inject 52 Units into the skin at bedtime. inject 52 units into skin at bedtime 07/11/15  Yes [provider]  levothyroxine (SYNTHROID, LEVOTHROID) 175 MCG tablet Take 175 mcg by mouth daily before breakfast.   Yes [provider]  lisinopril (PRINIVIL,ZESTRIL) 20 MG tablet Take 20 mg by mouth daily.  09/03/13  Yes Cherene Altes, MD  metoprolol tartrate (LOPRESSOR) 25 MG tablet TAKE 1 TABLET TWICE A DAY Patient taking differently: Take 25 mg by mouth 2 (two) times daily.  02/04/17  Yes Leonie Man, MD  NOVOLOG FLEXPEN 100 UNIT/ML FlexPen Inject 5-14 Units into the skin 3 (three) times daily with meals. Per sliding scale 01/03/19  Yes [provider]  Omega-3 Fatty Acids (FISH OIL) 1000 MG CAPS Take 1,000 mg by  mouth daily.   Yes [provider]  pantoprazole (PROTONIX) 40 MG tablet Take 1 tablet (40 mg total) by mouth 2 (two) times daily before a meal. 09/03/13  Yes Cherene Altes, MD  sulfamethoxazole-trimethoprim (BACTRIM) 400-80 MG tablet Take 1 tablet by mouth 2 (two) times daily. 05/19/19  Yes Gardiner Barefoot, DPM  ONETOUCH VERIO test strip USE 1 STRIP TO CHECK GLUCOSE THREE TIMES DAILY 12/24/17   [provider]    Physical Exam: Constitutional: Moderately built and nourished. Vitals:   05/23/19 2330 05/24/19 0015 05/24/19 0110 05/24/19 0110  BP: (!) 97/36 (!) 129/47  (!) 124/38  Pulse: 61 77  68  Resp:    19  Temp:    98.8 F (37.1 C)  TempSrc:    Oral  SpO2: 96% 98%  100%  Weight:   101.7 kg   Height:   '5\' 7"'  (1.702 m)    Eyes: Anicteric  no pallor. ENMT: No discharge from the ears eyes nose or mouth. Neck: No mass felt.  No neck rigidity. Respiratory: No rhonchi or crepitations. Cardiovascular: S1-S2 heard. Abdomen: Soft nontender bowel sounds present. Musculoskeletal: Left foot has an ulcer on the dorsal aspect and lateral aspect of the foot which has no active discharge. Skin: Left foot has an ulcer on the dorsal aspect. Neurologic: Alert awake oriented to time place and person.  Moves all extremities 5 x 5. Psychiatric: Appears normal.  Normal affect.   Labs on Admission: I have personally reviewed following labs and imaging studies  CBC: Recent Labs  Lab 05/23/19 1757  WBC 9.4  NEUTROABS 7.1  HGB 11.2*  HCT 36.4  MCV 94.1  PLT 007*   Basic Metabolic Panel: Recent Labs  Lab 05/23/19 1552  NA 137  K 4.1  CL 100  CO2 24  GLUCOSE 237*  BUN 23  CREATININE 1.65*  CALCIUM 8.4*   GFR: Estimated Creatinine Clearance: 41.1 mL/min (A) (by C-G formula based on SCr of 1.65 mg/dL (H)). Liver Function Tests: Recent Labs  Lab 05/23/19 1552  AST 16  ALT 16  ALKPHOS 85  BILITOT 0.6  PROT 7.2  ALBUMIN 2.8*   No results for input(s): LIPASE,  AMYLASE in the last 168 hours. No results for input(s): AMMONIA in the last 168 hours. Coagulation Profile: No results for input(s): INR, PROTIME in the last 168 hours. Cardiac Enzymes: No results for input(s): CKTOTAL, CKMB, CKMBINDEX, TROPONINI in the last 168 hours. BNP (last 3 results) No results for input(s): PROBNP in the last 8760 hours. HbA1C: No results for input(s): HGBA1C in the last 72 hours. CBG: No results for input(s): GLUCAP in the last 168 hours. Lipid Profile: No results for input(s): CHOL, HDL, LDLCALC, TRIG, CHOLHDL, LDLDIRECT in the last 72 hours. Thyroid Function Tests: No results for input(s): TSH, T4TOTAL, FREET4, T3FREE, THYROIDAB in the last 72 hours. Anemia Panel: No results for input(s): VITAMINB12, FOLATE, FERRITIN, TIBC, IRON, RETICCTPCT in the last 72 hours. Urine analysis:    Component Value Date/Time   COLORURINE YELLOW 05/23/2019 1552   APPEARANCEUR CLEAR 05/23/2019 1552   LABSPEC 1.015 05/23/2019 1552   PHURINE 5.0 05/23/2019 1552   GLUCOSEU >=500 (A) 05/23/2019 1552   HGBUR NEGATIVE 05/23/2019 1552   BILIRUBINUR NEGATIVE 05/23/2019 1552   KETONESUR NEGATIVE 05/23/2019 1552   PROTEINUR NEGATIVE 05/23/2019 1552   NITRITE NEGATIVE 05/23/2019 1552   LEUKOCYTESUR NEGATIVE 05/23/2019 1552   Sepsis Labs: '@LABRCNTIP' (procalcitonin:4,lacticidven:4) ) Recent Results (from the past 240 hour(s))  WOUND CULTURE     Status: Abnormal (Preliminary result)   Collection Time: 05/18/19  5:00 PM   Specimen: Foot, Left; Wound  Result Value Ref Range Status   MICRO NUMBER: [id number]  Preliminary   SPECIMEN QUALITY: Adequate  Preliminary   SOURCE: FOOT, LEFT  Preliminary   STATUS: PRELIMINARY  Preliminary   GRAM STAIN: Gram positive cocci in chains  Preliminary    Comment: No white blood cells seen No epithelial cells seen Many Gram positive cocci in chains   ISOLATE 1: Staphylococcus aureus (A)  Preliminary    Comment: Heavy growth of Staphylococcus aureus  , susceptibility test report to follow.   ISOLATE 2: Streptococcus agalactiae (A)  Preliminary    Comment: Heavy growth of Group B Streptococcus isolated Beta-hemolytic streptococci are predictably susceptible to Penicillin and other beta-lactams. Susceptibility testing not routinely performed. Please contact the laboratory within 3 days if susceptibility  testing is desired.  Radiological Exams on Admission: No results found.    Assessment/Plan Principal Problem:   Osteomyelitis of left foot (HCC) Active Problems:   CAD S/P percutaneous coronary angioplasty   Diabetes mellitus type 2 in obese [facility])   Essential hypertension   Osteomyelitis ([place])    1. Possible developing sepsis from cellulitis of the left foot with possible osteomyelitis for which I have placed patient on empiric antibiotics follow cultures continue hydration follow lactate.  I have ordered MRI of the left foot. 2. Acute renal failure could be from recent nausea and poor oral intake and hypotensive episodes in addition patient was on lisinopril.  Will discontinue lisinopril for now continue with hydration follow intake output metabolic panel. 3. CAD status post stenting denies any chest pain.  We will continue antiplatelet agents and I have decreased metoprolol dose since patient was having low normal blood pressure.  On statins. 4. History of panhypopituitarism for which patient is on Decadron and Synthroid.  I have given 1 dose of hydrocortisone as stress dose. 5. Diabetes mellitus type 2 -I have decreased the dose of patient's long-acting insulin by 5 units since patient has acute renal failure closely follow CBGs. 6. Hypertension holding lisinopril due to worsening renal function and low normal blood pressure.  We will keep patient as needed IV Dralzine.  I have decreased the dose of metoprolol. 7. Anemia appears to be chronic follow CBC. 8. Thrombocytosis could be reactionary.  Given the septic leg patient on  presentation will need close monitoring of patient for any further deterioration in inpatient status.   DVT prophylaxis: Heparin. Code Status: Full code. Family Communication: Discussed with patient. Disposition Plan: Home. Consults called: None. Admission status: Inpatient.   Rise Patience MD [facility] Pager [phone].  If 7PM-7AM, please contact night-coverage [email or web address] Password [email or web address]  05/24/2019, 1:15 AM

## 2019-05-24 NOTE — Plan of Care (Signed)
  Problem: Activity: Goal: Risk for activity intolerance will decrease Outcome: Progressing   Problem: Nutrition: Goal: Adequate nutrition will be maintained Outcome: Progressing   

## 2019-05-24 NOTE — Progress Notes (Signed)
Hypoglycemic Event  CBG: 64  Treatment: OJ  Symptoms: no s/sx  Follow-up CBG: [id number] CBG Result:111  Possible Reasons for Event: poor po intake  Comments/MD notified:yes    Mellody Dance, Yari Szeliga

## 2019-05-24 NOTE — Progress Notes (Signed)
Pharmacy Antibiotic Note  Tammy Good is a 68 y.o. female admitted on 05/23/2019 with wound infection.  Pharmacy has been consulted for Vancomycin/Zosyn dosing. WBC WNL. Noted renal dysfunction.   Plan: Vancomycin 1750 mg IV q48h >>Estimated AUC: 485 Zosyn 3.375G IV q8h to be infused over 4 hours Trend WBC, temp, renal function  F/U infectious work-up Drug levels as indicated   Height: 5\' 7"  (170.2 cm) Weight: 224 lb 3.3 oz (101.7 kg) IBW/kg (Calculated) : 61.6  Temp (24hrs), Avg:98.6 F (37 C), Min:98.3 F (36.8 C), Max:98.8 F (37.1 C)  Recent Labs  Lab 05/23/19 1552 05/23/19 1755 05/23/19 1757 05/24/19 0156  WBC  --   --  9.4 9.2  CREATININE 1.65*  --   --  1.82*  LATICACIDVEN 3.6* 2.9*  --  2.4*    Estimated Creatinine Clearance: 37.2 mL/min (A) (by C-G formula based on SCr of 1.82 mg/dL (H)).    Allergies  Allergen Reactions  . Strawberry Extract Itching, Swelling and Anaphylaxis    Mouth swells and gets itchy    Narda Bonds, PharmD, BCPS Clinical Pharmacist Phone: [phone]

## 2019-05-24 NOTE — Progress Notes (Addendum)
PROGRESS NOTE    STARLIGHT HARRISON  [id number] DOB: 01-12-1951 DOA: 05/23/2019 PCP: [name], MD      Brief Narrative:  Mrs. Mateja is a 69 y.o. 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  Podiatry had sent to ER due to x-rays suggested osteomyelitis.  This has failed outpatient antibiotics.  In 24 hours prior to admission, seemed to have some systemic signs of infection, including chills, subjective fevers, nausea, and vomiting.  In the ER, lactate 3.6, afebrile, WBC 9K.  Started on empiric antibiotics.        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 -Narrow to Vanc, CTX, Flagyl -Consult Podiatry -Obtain ABIs and arterial duplex   Acute kidney injury Baseline Cr 0.9.  Admitted with Cr 1.6, rising to 1.8 overnight. UA bland.  Making urine this morning, but no strict I/Os yet. 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  MRI showed osteo and septic arthritis.      I will discharge when patietn has completed surgical treatment.        MDM: This is a no charge note.  For further details, please see H&P by my partner Dr. Hal Hope from earlier today.  The  below labs and imaging reports were reviewed and summarized above.    DVT prophylaxis: Heparin Code Status: FULL Family Communication:     Consultants:   Podiatry  Procedures:  3/1 MRI foot -- IMPRESSION: 1. Large open wound on the lateral and dorsal aspect of the foot at the level of the mid distal fifth metatarsal. 2. Septic arthritis at the fifth MTP joint and advanced osteomyelitis involving the fifth metatarsal and fifth proximal phalanx. 3. Cellulitis and myofasciitis but no findings for pyomyositis.    Antimicrobials:   Vanc 3/1 >>  Zosyn x1  Ceftriaxone and Flagyl 3/2 >>   Culture data:   3/1 blood culture x2 -- NGTD           Subjective: Patient feeling well.  No fever, confusion.        Objective: Vitals:   05/24/19 0110 05/24/19 0110 05/24/19 0339 05/24/19 0850  BP:  (!) 124/38 (!) 109/44 (!) 120/31  Pulse:  68 63 73  Resp:  19 16   Temp:  98.8 F (37.1 C) 98.7 F (37.1 C) 98.1 F (36.7 C)  TempSrc:  Oral Oral Oral  SpO2:  100% 100% 100%  Weight: 101.7 kg     Height: 5\' 7"  (1.702 m)       Intake/Output Summary (Last 24 hours) at 05/24/2019 1418 Last data filed at 05/24/2019 0400 Gross per 24 hour  Intake 513.68 ml  Output --  Net 513.68 ml   Filed Weights   05/24/19 0110  Weight: 101.7 kg    Examination: The patient was seen and examined.      Data Reviewed: I  have personally reviewed following labs and imaging studies:  CBC: Recent Labs  Lab 05/23/19 1757 05/24/19 0156  WBC 9.4 9.2  NEUTROABS 7.1  --   HGB 11.2* 9.8*  HCT 36.4 31.3*  MCV 94.1 91.3  PLT 620* 0000000*   Basic Metabolic Panel: Recent Labs  Lab 05/23/19 1552 05/24/19 0156  NA 137 135  K 4.1 4.1  CL 100 102  CO2 24 21*  GLUCOSE 237* 160*  BUN 23 26*  CREATININE 1.65* 1.82*  CALCIUM 8.4* 7.9*   GFR: Estimated Creatinine Clearance: 37.2 mL/min (A) (by C-G formula based on SCr of 1.82 mg/dL (H)). Liver Function Tests: Recent Labs  Lab  05/23/19 1552  AST 16  ALT 16  ALKPHOS 85  BILITOT 0.6  PROT 7.2  ALBUMIN 2.8*   No results for input(s): LIPASE, AMYLASE in the last 168 hours. No results for input(s): AMMONIA in the last 168 hours. Coagulation Profile: No results for input(s): INR, PROTIME in the last 168 hours. Cardiac Enzymes: No results for input(s): CKTOTAL, CKMB, CKMBINDEX, TROPONINI in the last 168 hours. BNP (last 3 results) No results for input(s): PROBNP in the last 8760 hours. HbA1C: No results for input(s): HGBA1C in the last 72 hours. CBG: Recent Labs  Lab 05/24/19 0759 05/24/19 0838 05/24/19 1120  GLUCAP 64* 111* 123*   Lipid Profile: No results for input(s): CHOL, HDL, LDLCALC, TRIG, CHOLHDL, LDLDIRECT in the last 72 hours. Thyroid Function Tests: No results for input(s): TSH, T4TOTAL, FREET4, T3FREE, THYROIDAB in the last 72 hours. Anemia Panel: No results for input(s): VITAMINB12, FOLATE, FERRITIN, TIBC, IRON, RETICCTPCT in the last 72 hours. Urine analysis:    Component Value Date/Time   COLORURINE STRAW (A) 05/24/2019 [place] 05/24/2019 0918   LABSPEC 1.012 05/24/2019 0918   PHURINE 5.0 05/24/2019 0918   GLUCOSEU 150 (A) 05/24/2019 0918   HGBUR NEGATIVE 05/24/2019 0918   BILIRUBINUR NEGATIVE 05/24/2019 0918   KETONESUR NEGATIVE 05/24/2019 0918   PROTEINUR NEGATIVE 05/24/2019 0918   NITRITE NEGATIVE 05/24/2019 0918   LEUKOCYTESUR NEGATIVE 05/24/2019 0918   Sepsis Labs: @LABRCNTIP (procalcitonin:4,lacticacidven:4)  ) Recent Results (from the past 240 hour(s))  WOUND CULTURE     Status: Abnormal   Collection Time: 05/18/19  5:00 PM   Specimen: Foot, Left; Wound  Result Value Ref Range Status   MICRO NUMBER: [id number]  Final   SPECIMEN QUALITY: Adequate  Final   SOURCE: FOOT, LEFT  Final   STATUS: FINAL  Final   GRAM STAIN: Gram positive cocci in chains  Final    Comment: No white blood cells seen No epithelial cells seen Many Gram positive cocci in chains    ISOLATE 1: methicillin resistant Staphylococcus aureus (A)  Final    Comment: Heavy growth of Methicillin resistant Staphylococcus aureus (MRSA) This isolate demonstrates inducible clindamycin resistance.   ISOLATE 2: Streptococcus agalactiae (A)  Final    Comment: Heavy growth of Group B Streptococcus isolated Beta-hemolytic streptococci are predictably susceptible to Penicillin and other beta-lactams. Susceptibility testing not routinely performed. Please contact the laboratory within 3 days if susceptibility  testing is desired.       Susceptibility   Methicillin resistant staphylococcus aureus - AEROBIC CULT, GRAM STAIN POSITIVE 1    VANCOMYCIN 1 Sensitive     CIPROFLOXACIN <=0.5 Sensitive     CLINDAMYCIN NR Resistant     LEVOFLOXACIN 0.25 Sensitive     ERYTHROMYCIN >=8 Resistant     GENTAMICIN <=0.5 Sensitive  OXACILLIN* NR Resistant      * Oxacillin-resistant staphylococci are resistant toall currently available beta-lactam antimicrobialagents including penicillins, beta lactam/beta-lactamase inhibitor combinations, and cephems withstaphylococcal indications, including Cefazolin.    TETRACYCLINE <=1 Sensitive     TRIMETH/SULFA* <=10 Sensitive      * Oxacillin-resistant staphylococci are resistant toall currently available beta-lactam antimicrobialagents including penicillins, beta lactam/beta-lactamase inhibitor combinations, and cephems withstaphylococcal indications, including Cefazolin.Legend:S = Susceptible  I = IntermediateR = Resistant  NS = Not susceptible* = Not tested  NR = Not reported**NN = See antimicrobic comments  Blood culture (routine x 2)     Status: None (Preliminary result)   Collection Time: 05/23/19 10:17 PM   Specimen: BLOOD  Result Value Ref Range Status   Specimen Description BLOOD LEFT ANTECUBITAL  Final   Special Requests   Final    BOTTLES DRAWN AEROBIC AND ANAEROBIC Blood Culture results may not be optimal due to an inadequate volume of blood received  in culture bottles   Culture   Final    NO GROWTH < 12 HOURS Performed at [facility] [address], [place], [place] [address]    Report Status PENDING  Incomplete  Blood culture (routine x 2)     Status: None (Preliminary result)   Collection Time: 05/23/19 10:33 PM   Specimen: BLOOD RIGHT HAND  Result Value Ref Range Status   Specimen Description BLOOD RIGHT HAND  Final   Special Requests   Final    BOTTLES DRAWN AEROBIC AND ANAEROBIC Blood Culture results may not be optimal due to an inadequate volume of blood received in culture bottles   Culture   Final    NO GROWTH < 12 HOURS Performed at [facility], [place] [address], [place], [place] [address]    Report Status PENDING  Incomplete  SARS CORONAVIRUS 2 (TAT 6-24 HRS) Nasopharyngeal Nasopharyngeal Swab     Status: None   Collection Time: 05/24/19 12:25 AM   Specimen: Nasopharyngeal Swab  Result Value Ref Range Status   SARS Coronavirus 2 NEGATIVE NEGATIVE Final    Comment: (NOTE) SARS-CoV-2 target nucleic acids are NOT DETECTED. The SARS-CoV-2 RNA is generally detectable in upper and lower respiratory specimens during the acute phase of infection. Negative results do not preclude SARS-CoV-2 infection, do not rule out co-infections with other pathogens, and should not be used as the sole basis for treatment or other patient management decisions. Negative results must be combined with clinical observations, patient history, and epidemiological information. The expected result is Negative. Fact Sheet for Patients: SugarRoll.be Fact Sheet for Healthcare Providers: https://www.woods-mathews.com/ This test is not yet approved or cleared by the [place] FDA and  has been authorized for detection and/or diagnosis of SARS-CoV-2 by FDA under an Emergency Use Authorization (EUA). This EUA will remain  in effect (meaning this test can be used) for the duration of  the COVID-19 declaration under Section 56 4(b)(1) of the Act, 21 U.S.C. section 360bbb-3(b)(1), unless the authorization is terminated or revoked sooner. Performed at [facility], [place] [address] [place], [place] [address]          Radiology Studies: MR FOOT LEFT WO CONTRAST  Result Date: 05/24/2019 CLINICAL DATA:  Open wound on the lateral aspect of the foot. EXAM: MRI OF THE LEFT FOOT WITHOUT CONTRAST TECHNIQUE: Multiplanar, multisequence MR imaging of the left foot was performed. No intravenous contrast was administered. COMPARISON:  Radiographs 05/18/2019 FINDINGS: Large open wound noted on the lateral and  dorsal aspect of the foot at the level of the mid distal fifth metatarsal. Associated small fluid collection at the base of the wound is likely a small abscess. There may be a small drain in this area also. Surrounding diffuse subcutaneous soft tissue swelling/edema/fluid consistent with cellulitis. There is also moderate myofasciitis involving the lateral aspect of the forefoot. No findings suspicious for pyomyositis. Septic arthritis at the fifth MTP joint and advanced osteomyelitis involving the fifth metatarsal and fifth proximal phalanx. As demonstrated on the plain films there is destructive bony change. The other bony structures are intact. I do not see any other definite sites of osteomyelitis or septic arthritis. The major tendons and ligaments appear intact. IMPRESSION: 1. Large open wound on the lateral and dorsal aspect of the foot at the level of the mid distal fifth metatarsal. 2. Septic arthritis at the fifth MTP joint and advanced osteomyelitis involving the fifth metatarsal and fifth proximal phalanx. 3. Cellulitis and myofasciitis but no findings for pyomyositis. Electronically Signed   By: Marijo Sanes M.D.   On: 05/24/2019 08:02        Scheduled Meds: . aspirin EC  81 mg Oral Daily  . atorvastatin  40 mg Oral Daily  . Chlorhexidine Gluconate Cloth  6 each  Topical Once   And  . Chlorhexidine Gluconate Cloth  6 each Topical Once  . dexamethasone  0.5 mg Oral Daily  . ferrous sulfate  325 mg Oral Q breakfast  . heparin  5,000 Units Subcutaneous Q8H  . insulin aspart  0-9 Units Subcutaneous TID WC  . insulin glargine  20 Units Subcutaneous QHS  . lactobacillus acidophilus  2 tablet Oral BID  . levothyroxine  175 mcg Oral QAC breakfast  . omega-3 acid ethyl esters  1 g Oral Daily  . pantoprazole  40 mg Oral BID AC  . sodium chloride flush  3 mL Intravenous Once   Continuous Infusions: . sodium chloride 125 mL/hr at 05/24/19 0903  . cefTRIAXone (ROCEPHIN)  IV 2 g (05/24/19 1042)   And  . metronidazole 500 mg (05/24/19 1126)     LOS: 1 day    Time spent: 15 minutes    Edwin Dada, MD [facility] 05/24/2019, 2:18 PM     Please page though [facility] or Epic secure chat:  For password, contact charge nurse

## 2019-05-24 NOTE — Consult Note (Signed)
Reason for Consult: Osteomyelitis  Referring Physician: Dr. Myrene Buddy, MD  Tammy Good is an 69 y.o. female.  HPI: 69 year old female admitted to the hospital yesterday after suffering clinic for worsening left foot ulcerations, stable.  She has previously been under the care of Dr. Sharyon Cable as well as Dr. Amalia Hailey. She reports that the wounds have been worsening over the last week.  X-ray previously did show osteomyelitis and she had vascular studies ordered.  However given the worsening of the wounds as well as concern for infection recommend admission to the hospital for IV antibiotics and likely surgical intervention.  Past Medical History:  Diagnosis Date  . CAD S/P percutaneous coronary angioplasty 08/2013   100% mRCA - PCI Integrity Resolute DES 3.0 mm x 38 mm - 3.35 mm; PTCA of RPA V 2.0 mm x 15 mm  . Cholesteatoma    right  . Diabetes mellitus type 2 in obese (HCC)    On insulin and Invokana  . Hyperlipidemia with target LDL less than 70   . Hypothyroidism (acquired)   . Mild essential hypertension   . Obesity (BMI 30-39.9) 11/17/2013  . Panhypopituitarism ([place])   . ST elevation myocardial infarction (STEMI) of inferior wall, subsequent episode of care ([place]) 08/2013   80% branch of D1, 40% mid AV groove circumflex, 100% RCA with subacute thrombus -- thrombus extending into RPA V with 100% occlusion after initial angioplasty of mid RCA ;; Post MI ECHO 6/9/'15: EF 50-55%, mild LVH with moderate HK of inferior wall, Gr1 DD, mild LA dilation; mildly reduced RV function  . Status post insertion of drug eluting coronary artery stent to RCA emergently 09/16/2013    Past Surgical History:  Procedure Laterality Date  . Cardiac Event Monitor  July-August 2015   Sinus rhythm with PVCs  . COLONOSCOPY N/A 08/31/2013   Procedure: COLONOSCOPY;  Surgeon: Juanita Craver, MD;  Location: [facility] ENDOSCOPY;  Service: Endoscopy;  Laterality: N/A;  . ESOPHAGOGASTRODUODENOSCOPY N/A 09/01/2013    Procedure: ESOPHAGOGASTRODUODENOSCOPY (EGD);  Surgeon: Beryle Beams, MD;  Location: [facility] ENDOSCOPY;  Service: Endoscopy;  Laterality: N/A;  bedside  . LEFT HEART CATHETERIZATION WITH CORONARY ANGIOGRAM N/A 08/30/2013   Procedure: LEFT HEART CATHETERIZATION WITH CORONARY ANGIOGRAM;  Surgeon: Leonie Man, MD;  Location: [facility] CATH LAB: 100% mRCA (thrombus - extends to RPAV), 80% D1, 40% AVG Cx.  [name] PERCUTANEOUS CORONARY STENT INTERVENTION (PCI-S)  08/30/2013   Procedure: PERCUTANEOUS CORONARY STENT INTERVENTION (PCI-S);  Surgeon: Leonie Man, MD;  Location: [facility] CATH LAB;  Integrity Resolute DES 2.0 mm x 38 mm -- 3.35 mm.; PTCA of proximal RPA V. - 2.0 mm x 15 mm balloon  . PITUITARY SURGERY    . TRANSTHORACIC ECHOCARDIOGRAM  08/30/2013   mild LVH. EF 50-55%. Moderate HK of the entire inferior myocardium. GR 1 DD. Mild LA dilation. Mildly reduced RV function  . TYMPANOMASTOIDECTOMY Right 12/28/2017   Procedure: RIGHT TYMPANOMASTOIDECTOMY;  Surgeon: Leta Baptist, MD;  Location: [place];  Service: ENT;  Laterality: Right;    Family History  Problem Relation Age of Onset  . Cancer Mother 56       multiple myeloma  . Heart attack Father 37  . Cancer Sister   . Alzheimer's disease Maternal Grandmother     Social History:  reports that she has quit smoking. She has never used smokeless tobacco. She reports that she does not drink alcohol or use drugs.  Allergies:  Allergies  Allergen Reactions  . Strawberry  Extract Itching, Swelling and Anaphylaxis    Mouth swells and gets itchy    Medications: Reviewed  Results for orders placed or performed during the hospital encounter of 05/23/19 (from the past 48 hour(s))  Lactic acid, plasma     Status: Abnormal   Collection Time: 05/23/19  3:52 PM  Result Value Ref Range   Lactic Acid, Venous 3.6 (HH) 0.5 - 1.9 mmol/L    Comment: CRITICAL RESULT CALLED TO, READ BACK BY AND VERIFIED WITH: Lamarr Lulas RN [id number] Diamond Performed  at [facility] [address], [place], [place] [address]   Comprehensive metabolic panel     Status: Abnormal   Collection Time: 05/23/19  3:52 PM  Result Value Ref Range   Sodium 137 135 - 145 mmol/L   Potassium 4.1 3.5 - 5.1 mmol/L   Chloride 100 98 - 111 mmol/L   CO2 24 22 - 32 mmol/L   Glucose, Bld 237 (H) 70 - 99 mg/dL    Comment: Glucose reference range applies only to samples taken after fasting for at least 8 hours.   BUN 23 8 - 23 mg/dL   Creatinine, Ser 1.65 (H) 0.44 - 1.00 mg/dL   Calcium 8.4 (L) 8.9 - 10.3 mg/dL   Total Protein 7.2 6.5 - 8.1 g/dL   Albumin 2.8 (L) 3.5 - 5.0 g/dL   AST 16 15 - 41 U/L   ALT 16 0 - 44 U/L   Alkaline Phosphatase 85 38 - 126 U/L   Total Bilirubin 0.6 0.3 - 1.2 mg/dL   GFR calc non Af Amer 32 (L) >60 mL/min   GFR calc Af Amer 37 (L) >60 mL/min   Anion gap 13 5 - 15    Comment: Performed at [facility] [address] [place], [place] [address]  Urinalysis, Routine w reflex microscopic     Status: Abnormal   Collection Time: 05/23/19  3:52 PM  Result Value Ref Range   Color, Urine YELLOW YELLOW   APPearance CLEAR CLEAR   Specific Gravity, Urine 1.015 1.005 - 1.030   pH 5.0 5.0 - 8.0   Glucose, UA >=500 (A) NEGATIVE mg/dL   Hgb urine dipstick NEGATIVE NEGATIVE   Bilirubin Urine NEGATIVE NEGATIVE   Ketones, ur NEGATIVE NEGATIVE mg/dL   Protein, ur NEGATIVE NEGATIVE mg/dL   Nitrite NEGATIVE NEGATIVE   Leukocytes,Ua NEGATIVE NEGATIVE   RBC / HPF 0-5 0 - 5 RBC/hpf   WBC, UA 0-5 0 - 5 WBC/hpf   Bacteria, UA NONE SEEN NONE SEEN   Squamous Epithelial / LPF 0-5 0 - 5   Mucus PRESENT    Uric Acid Crys, UA PRESENT     Comment: Performed at [facility] [address] [place], [place] [address]  Lactic acid, plasma     Status: Abnormal   Collection Time: 05/23/19  5:55 PM  Result Value Ref Range   Lactic Acid, Venous 2.9 (HH) 0.5 - 1.9 mmol/L    Comment: CRITICAL VALUE NOTED.  VALUE IS CONSISTENT WITH PREVIOUSLY  REPORTED AND CALLED VALUE. Performed at [facility], [place] [address] [place], [place] [address]   CBC with Differential     Status: Abnormal   Collection Time: 05/23/19  5:57 PM  Result Value Ref Range   WBC 9.4 4.0 - 10.5 K/uL   RBC 3.87 3.87 - 5.11 MIL/uL   Hemoglobin 11.2 (L) 12.0 - 15.0 g/dL   HCT 36.4 36.0 - 46.0 %  MCV 94.1 80.0 - 100.0 fL   MCH 28.9 26.0 - 34.0 pg   MCHC 30.8 30.0 - 36.0 g/dL   RDW 15.0 11.5 - 15.5 %   Platelets 620 (H) 150 - 400 K/uL   nRBC 0.0 0.0 - 0.2 %   Neutrophils Relative % 75 %   Neutro Abs 7.1 1.7 - 7.7 K/uL   Lymphocytes Relative 13 %   Lymphs Abs 1.3 0.7 - 4.0 K/uL   Monocytes Relative 9 %   Monocytes Absolute 0.8 0.1 - 1.0 K/uL   Eosinophils Relative 1 %   Eosinophils Absolute 0.1 0.0 - 0.5 K/uL   Basophils Relative 1 %   Basophils Absolute 0.1 0.0 - 0.1 K/uL   Immature Granulocytes 1 %   Abs Immature Granulocytes 0.05 0.00 - 0.07 K/uL    Comment: Performed at [facility] [address] [place], [place] [address]  Blood culture (routine x 2)     Status: None (Preliminary result)   Collection Time: 05/23/19 10:17 PM   Specimen: BLOOD  Result Value Ref Range   Specimen Description BLOOD LEFT ANTECUBITAL    Special Requests      BOTTLES DRAWN AEROBIC AND ANAEROBIC Blood Culture results may not be optimal due to an inadequate volume of blood received in culture bottles   Culture      NO GROWTH < 12 HOURS Performed at [facility] [address], [place], [place] [address]    Report Status PENDING   Blood culture (routine x 2)     Status: None (Preliminary result)   Collection Time: 05/23/19 10:33 PM   Specimen: BLOOD RIGHT HAND  Result Value Ref Range   Specimen Description BLOOD RIGHT HAND    Special Requests      BOTTLES DRAWN AEROBIC AND ANAEROBIC Blood Culture results may not be optimal due to an inadequate volume of blood received in culture bottles   Culture      NO GROWTH < 12 HOURS Performed at [facility], [place] [address], [place], [place] [address]    Report Status PENDING   SARS CORONAVIRUS 2 (TAT 6-24 HRS) Nasopharyngeal Nasopharyngeal Swab     Status: None   Collection Time: 05/24/19 12:25 AM   Specimen: Nasopharyngeal Swab  Result Value Ref Range   SARS Coronavirus 2 NEGATIVE NEGATIVE    Comment: (NOTE) SARS-CoV-2 target nucleic acids are NOT DETECTED. The SARS-CoV-2 RNA is generally detectable in upper and lower respiratory specimens during the acute phase of infection. Negative results do not preclude SARS-CoV-2 infection, do not rule out co-infections with other pathogens, and should not be used as the sole basis for treatment or other patient management decisions. Negative results must be combined with clinical observations, patient history, and epidemiological information. The expected result is Negative. Fact Sheet for Patients: SugarRoll.be Fact Sheet for Healthcare Providers: https://www.woods-mathews.com/ This test is not yet approved or cleared by the [place] FDA and  has been authorized for detection and/or diagnosis of SARS-CoV-2 by FDA under an Emergency Use Authorization (EUA). This EUA will remain  in effect (meaning this test can be used) for the duration of the COVID-19 declaration under Section 56 4(b)(1) of the Act, 21 U.S.C. section 360bbb-3(b)(1), unless the authorization is terminated or revoked sooner. Performed at [facility], [place] [address] [place], [place] [address]   HIV Antibody (routine testing w rflx)     Status: None   Collection Time: 05/24/19  1:56  AM  Result Value Ref Range   HIV Screen 4th Generation wRfx NON REACTIVE NON REACTIVE    Comment: Performed at [facility], [place] [address], [place], [place] [address]  Basic metabolic panel     Status: Abnormal   Collection Time: 05/24/19  1:56 AM  Result Value Ref Range   Sodium 135 135 - 145 mmol/L   Potassium 4.1 3.5 -  5.1 mmol/L   Chloride 102 98 - 111 mmol/L   CO2 21 (L) 22 - 32 mmol/L   Glucose, Bld 160 (H) 70 - 99 mg/dL    Comment: Glucose reference range applies only to samples taken after fasting for at least 8 hours.   BUN 26 (H) 8 - 23 mg/dL   Creatinine, Ser 1.82 (H) 0.44 - 1.00 mg/dL   Calcium 7.9 (L) 8.9 - 10.3 mg/dL   GFR calc non Af Amer 28 (L) >60 mL/min   GFR calc Af Amer 33 (L) >60 mL/min   Anion gap 12 5 - 15    Comment: Performed at [facility] [address] [place], [place] [address]  CBC     Status: Abnormal   Collection Time: 05/24/19  1:56 AM  Result Value Ref Range   WBC 9.2 4.0 - 10.5 K/uL   RBC 3.43 (L) 3.87 - 5.11 MIL/uL   Hemoglobin 9.8 (L) 12.0 - 15.0 g/dL   HCT 31.3 (L) 36.0 - 46.0 %   MCV 91.3 80.0 - 100.0 fL   MCH 28.6 26.0 - 34.0 pg   MCHC 31.3 30.0 - 36.0 g/dL   RDW 14.8 11.5 - 15.5 %   Platelets 518 (H) 150 - 400 K/uL   nRBC 0.0 0.0 - 0.2 %    Comment: Performed at [facility], [place] [address], [place], [place] [address]  Lactic acid, plasma     Status: Abnormal   Collection Time: 05/24/19  1:56 AM  Result Value Ref Range   Lactic Acid, Venous 2.4 (HH) 0.5 - 1.9 mmol/L    Comment: CRITICAL VALUE NOTED.  VALUE IS CONSISTENT WITH PREVIOUSLY REPORTED AND CALLED VALUE. Performed at [facility], [place] [address] [place], [place] [address]   Lactic acid, plasma     Status: None   Collection Time: 05/24/19  7:41 AM  Result Value Ref Range   Lactic Acid, Venous 1.4 0.5 - 1.9 mmol/L    Comment: Performed at [facility] [address], [place], [place] [address]  Troponin I (High Sensitivity)     Status: None   Collection Time: 05/24/19  7:41 AM  Result Value Ref Range   Troponin I (High Sensitivity) 4 <18 ng/L    Comment: (NOTE) Elevated high sensitivity troponin I (hsTnI) values and significant  changes across serial measurements may suggest ACS but many other  chronic and acute conditions are known to elevate hsTnI results.   Refer to the "Links" section for chest pain algorithms and additional  guidance. Performed at [facility], [place] [address] [place], [place] [address]   Procalcitonin - Baseline     Status: None   Collection Time: 05/24/19  7:41 AM  Result Value Ref Range   Procalcitonin <0.10 ng/mL    Comment:        Interpretation: PCT (Procalcitonin) <= 0.5 ng/mL: Systemic infection (sepsis) is not likely. Local bacterial infection is possible. (NOTE)       Sepsis PCT Algorithm  Lower Respiratory Tract                                      Infection PCT Algorithm    ----------------------------     ----------------------------         PCT < 0.25 ng/mL                PCT < 0.10 ng/mL         Strongly encourage             Strongly discourage   discontinuation of antibiotics    initiation of antibiotics    ----------------------------     -----------------------------       PCT 0.25 - 0.50 ng/mL            PCT 0.10 - 0.25 ng/mL               OR       >80% decrease in PCT            Discourage initiation of                                            antibiotics      Encourage discontinuation           of antibiotics    ----------------------------     -----------------------------         PCT >= 0.50 ng/mL              PCT 0.26 - 0.50 ng/mL               AND        <80% decrease in PCT             Encourage initiation of                                             antibiotics       Encourage continuation           of antibiotics    ----------------------------     -----------------------------        PCT >= 0.50 ng/mL                  PCT > 0.50 ng/mL               AND         increase in PCT                  Strongly encourage                                      initiation of antibiotics    Strongly encourage escalation           of antibiotics                                     -----------------------------  PCT <= 0.25 ng/mL                                                  OR                                        > 80% decrease in PCT                                     Discontinue / Do not initiate                                             antibiotics Performed at [facility], [place] [address] [place], [place] [address]   Glucose, capillary     Status: Abnormal   Collection Time: 05/24/19  7:59 AM  Result Value Ref Range   Glucose-Capillary 64 (L) 70 - 99 mg/dL    Comment: Glucose reference range applies only to samples taken after fasting for at least 8 hours.  Glucose, capillary     Status: Abnormal   Collection Time: 05/24/19  8:38 AM  Result Value Ref Range   Glucose-Capillary 111 (H) 70 - 99 mg/dL    Comment: Glucose reference range applies only to samples taken after fasting for at least 8 hours.  Urinalysis, Routine w reflex microscopic     Status: Abnormal   Collection Time: 05/24/19  9:18 AM  Result Value Ref Range   Color, Urine STRAW (A) YELLOW   APPearance CLEAR CLEAR   Specific Gravity, Urine 1.012 1.005 - 1.030   pH 5.0 5.0 - 8.0   Glucose, UA 150 (A) NEGATIVE mg/dL   Hgb urine dipstick NEGATIVE NEGATIVE   Bilirubin Urine NEGATIVE NEGATIVE   Ketones, ur NEGATIVE NEGATIVE mg/dL   Protein, ur NEGATIVE NEGATIVE mg/dL   Nitrite NEGATIVE NEGATIVE   Leukocytes,Ua NEGATIVE NEGATIVE    Comment: Performed at [facility] [address], [place], [place] [address]  Creatinine, urine, random     Status: None   Collection Time: 05/24/19  9:47 AM  Result Value Ref Range   Creatinine, Urine 52.26 mg/dL    Comment: Performed at [facility] [address], [place], [place] [address]  Protein / creatinine ratio, urine     Status: Abnormal   Collection Time: 05/24/19  9:47 AM  Result Value Ref Range   Creatinine, Urine 51.72 mg/dL   Total Protein, Urine 11 mg/dL    Comment: NO NORMAL RANGE ESTABLISHED FOR THIS TEST   Protein Creatinine Ratio 0.21 (H) 0.00 - 0.15 mg/mg[Cre]     Comment: Performed at [facility] [address], [place], [place] [address]  Sodium, urine, random     Status: None   Collection Time: 05/24/19  9:47 AM  Result Value Ref Range   Sodium, Ur 83 mmol/L    Comment: Performed at [facility] [address], [place], [place] [address]  Glucose, capillary     Status: Abnormal   Collection Time: 05/24/19 11:20 AM  Result Value Ref Range   Glucose-Capillary 123 (H) 70 - 99 mg/dL    Comment: Glucose reference range applies only to samples taken after fasting for at least 8 hours.    MR FOOT LEFT WO CONTRAST  Result Date: 05/24/2019 CLINICAL DATA:  Open wound on the lateral aspect of the foot. EXAM: MRI OF THE LEFT FOOT WITHOUT CONTRAST TECHNIQUE: Multiplanar, multisequence MR imaging of the left foot was performed. No intravenous contrast was administered. COMPARISON:  Radiographs 05/18/2019 FINDINGS: Large open wound noted on the lateral and dorsal aspect of the foot at the level of the mid distal fifth metatarsal. Associated small fluid collection at the base of the wound is likely a small abscess. There may be a small drain in this area also. Surrounding diffuse subcutaneous soft tissue swelling/edema/fluid consistent with cellulitis. There is also moderate myofasciitis involving the lateral aspect of the forefoot. No findings suspicious for pyomyositis. Septic arthritis at the fifth MTP joint and advanced osteomyelitis involving the fifth metatarsal and fifth proximal phalanx. As demonstrated on the plain films there is destructive bony change. The other bony structures are intact. I do not see any other definite sites of osteomyelitis or septic arthritis. The major tendons and ligaments appear intact. IMPRESSION: 1. Large open wound on the lateral and dorsal aspect of the foot at the level of the mid distal fifth metatarsal. 2. Septic arthritis at the fifth MTP joint and advanced osteomyelitis involving the fifth metatarsal and fifth  proximal phalanx. 3. Cellulitis and myofasciitis but no findings for pyomyositis. Electronically Signed   By: Marijo Sanes M.D.   On: 05/24/2019 08:02    Review of Systems Blood pressure (!) 120/31, pulse 73, temperature 98.1 F (36.7 C), temperature source Oral, resp. rate 16, height '5\' 7"'$  (1.702 m), weight 101.7 kg, SpO2 100 %. Physical Exam General: AAO x3, NAD  Dermatological: Large ulcerations x2 presents the lateral aspect the left foot and there is exposed tendon serosanguineous drainage expressed.  Mild warmth.  No exposed bone.  Pressure crepitation.  Grossly unchanged compared to yesterday.  Vascular: Dorsalis Pedis artery and Posterior Tibial artery pedal pulses are decreased bilaterally.  There is no pain with calf compression, swelling, warmth, erythema.   Neruologic: Sensation decreased  Musculoskeletal: No tenderness to palpation  Assessment/Plan: Osteomyelitis, septic joint left fifth MPJ  I reviewed the MRI with her.  Overall she is stable.  At this time we discussed limb salvage versus amputation.  At this point given the infection I recommended x-ray amputation just the surgical hospital course.  We will plan for left foot fifth ray amputation tomorrow.  Discussed with her possible return to surgery on Friday for washout, wound closure/VAC if needed.  We discussed risks of surgery including, but not limited to, spread of infection, delayed or nonhealing, blood clots, loss of foot or leg.  Discussed general risks of surgery including stroke, heart attack, death.  Plan for vascular studies hopefully today prior to surgery.  She had no further questions or concerns today.  I offered to call family members but she declined.  NPO after midnight in anticipation for surgery tomorrow.   Trula Slade 05/24/2019, 1:04 PM   O: [phone] C: [phone]

## 2019-05-25 ENCOUNTER — Encounter (HOSPITAL_COMMUNITY)
Admission: EM | Disposition: A | Discharge status: Home Health | Payer: Self-pay | Source: Home / Self Care | Attending: Internal Medicine

## 2019-05-25 ENCOUNTER — Inpatient Hospital Stay (HOSPITAL_COMMUNITY): Payer: BC Managed Care – PPO | Admitting: Certified Registered Nurse Anesthetist

## 2019-05-25 ENCOUNTER — Inpatient Hospital Stay (HOSPITAL_COMMUNITY): Payer: BC Managed Care – PPO

## 2019-05-25 ENCOUNTER — Encounter (HOSPITAL_COMMUNITY): Payer: Self-pay | Admitting: Internal Medicine

## 2019-05-25 DIAGNOSIS — E11621 Type 2 diabetes mellitus with foot ulcer: Secondary | ICD-10-CM

## 2019-05-25 DIAGNOSIS — Z9861 Coronary angioplasty status: Secondary | ICD-10-CM

## 2019-05-25 DIAGNOSIS — M86672 Other chronic osteomyelitis, left ankle and foot: Secondary | ICD-10-CM

## 2019-05-25 DIAGNOSIS — L97529 Non-pressure chronic ulcer of other part of left foot with unspecified severity: Secondary | ICD-10-CM

## 2019-05-25 DIAGNOSIS — L0390 Cellulitis, unspecified: Secondary | ICD-10-CM

## 2019-05-25 HISTORY — PX: AMPUTATION: SHX166

## 2019-05-25 LAB — BASIC METABOLIC PANEL
Anion gap: 8 (ref 5–15)
BUN: 16 mg/dL (ref 8–23)
CO2: 23 mmol/L (ref 22–32)
Calcium: 8 mg/dL — ABNORMAL LOW (ref 8.9–10.3)
Chloride: 109 mmol/L (ref 98–111)
Creatinine, Ser: 1.34 mg/dL — ABNORMAL HIGH (ref 0.44–1.00)
GFR calc Af Amer: 48 mL/min — ABNORMAL LOW (ref >60)
GFR calc non Af Amer: 41 mL/min — ABNORMAL LOW (ref >60)
Glucose, Bld: 150 mg/dL — ABNORMAL HIGH (ref 70–99)
Potassium: 4 mmol/L (ref 3.5–5.1)
Sodium: 140 mmol/L (ref 135–145)

## 2019-05-25 LAB — CBC
HCT: 30.2 % — ABNORMAL LOW (ref 36.0–46.0)
Hemoglobin: 9.3 g/dL — ABNORMAL LOW (ref 12.0–15.0)
MCH: 28.5 pg (ref 26.0–34.0)
MCHC: 30.8 g/dL (ref 30.0–36.0)
MCV: 92.6 fL (ref 80.0–100.0)
Platelets: 507 10*3/uL — ABNORMAL HIGH (ref 150–400)
RBC: 3.26 MIL/uL — ABNORMAL LOW (ref 3.87–5.11)
RDW: 14.9 % (ref 11.5–15.5)
WBC: 8.1 10*3/uL (ref 4.0–10.5)
nRBC: 0 % (ref 0.0–0.2)

## 2019-05-25 LAB — GLUCOSE, CAPILLARY
Glucose-Capillary: 105 mg/dL — ABNORMAL HIGH (ref 70–99)
Glucose-Capillary: 87 mg/dL (ref 70–99)
Glucose-Capillary: 112 mg/dL — ABNORMAL HIGH (ref 70–99)
Glucose-Capillary: 218 mg/dL — ABNORMAL HIGH (ref 70–99)
Glucose-Capillary: 154 mg/dL — ABNORMAL HIGH (ref 70–99)

## 2019-05-25 SURGERY — AMPUTATION, FOOT, RAY
Anesthesia: General | Laterality: Left

## 2019-05-25 MED ORDER — FENTANYL CITRATE (PF) 100 MCG/2ML IJ SOLN
INTRAMUSCULAR | Status: DC | PRN
  Administered 2019-05-25 (×2): 25 ug via INTRAVENOUS

## 2019-05-25 MED ORDER — FENTANYL CITRATE (PF) 100 MCG/2ML IJ SOLN: 25.0000 ug | INTRAMUSCULAR | Status: DC | PRN

## 2019-05-25 MED ORDER — LIDOCAINE 2% (20 MG/ML) 5 ML SYRINGE
INTRAMUSCULAR | Status: AC
  Filled 2019-05-25: qty 5

## 2019-05-25 MED ORDER — 0.9 % SODIUM CHLORIDE (POUR BTL) OPTIME
TOPICAL | Status: DC | PRN
  Administered 2019-05-25: 1000 mL

## 2019-05-25 MED ORDER — EPHEDRINE 5 MG/ML INJ
INTRAVENOUS | Status: AC
  Filled 2019-05-25: qty 10

## 2019-05-25 MED ORDER — PROPOFOL 500 MG/50ML IV EMUL
INTRAVENOUS | Status: DC | PRN
  Administered 2019-05-25: 80 ug/kg/min via INTRAVENOUS

## 2019-05-25 MED ORDER — PHENYLEPHRINE 40 MCG/ML (10ML) SYRINGE FOR IV PUSH (FOR BLOOD PRESSURE SUPPORT)
PREFILLED_SYRINGE | INTRAVENOUS | Status: AC
  Filled 2019-05-25: qty 30

## 2019-05-25 MED ORDER — BUPIVACAINE HCL 0.5 % IJ SOLN
INTRAMUSCULAR | Status: DC | PRN
  Administered 2019-05-25: 30 mL

## 2019-05-25 MED ORDER — LACTATED RINGERS IV SOLN: INTRAVENOUS | Status: DC

## 2019-05-25 MED ORDER — DEXAMETHASONE SODIUM PHOSPHATE 10 MG/ML IJ SOLN
INTRAMUSCULAR | Status: AC
  Filled 2019-05-25: qty 2

## 2019-05-25 MED ORDER — EPHEDRINE SULFATE-NACL 50-0.9 MG/10ML-% IV SOSY
PREFILLED_SYRINGE | INTRAVENOUS | Status: DC | PRN
  Administered 2019-05-25: 15 mg via INTRAVENOUS

## 2019-05-25 MED ORDER — BUPIVACAINE HCL (PF) 0.5 % IJ SOLN
INTRAMUSCULAR | Status: AC
  Filled 2019-05-25: qty 30

## 2019-05-25 MED ORDER — LIDOCAINE HCL (PF) 1 % IJ SOLN
INTRAMUSCULAR | Status: AC
  Filled 2019-05-25: qty 30

## 2019-05-25 MED ORDER — ONDANSETRON HCL 4 MG/2ML IJ SOLN
INTRAMUSCULAR | Status: AC
  Filled 2019-05-25: qty 4

## 2019-05-25 MED ORDER — PHENYLEPHRINE 40 MCG/ML (10ML) SYRINGE FOR IV PUSH (FOR BLOOD PRESSURE SUPPORT)
PREFILLED_SYRINGE | INTRAVENOUS | Status: DC | PRN
  Administered 2019-05-25: 120 ug via INTRAVENOUS
  Administered 2019-05-25: 80 ug via INTRAVENOUS
  Administered 2019-05-25 (×2): 120 ug via INTRAVENOUS
  Administered 2019-05-25: 80 ug via INTRAVENOUS

## 2019-05-25 MED ORDER — MIDAZOLAM HCL 2 MG/2ML IJ SOLN
INTRAMUSCULAR | Status: DC | PRN
  Administered 2019-05-25: 2 mg via INTRAVENOUS

## 2019-05-25 MED ORDER — LIDOCAINE HCL 1 % IJ SOLN
INTRAMUSCULAR | Status: DC | PRN
  Administered 2019-05-25: 30 mL

## 2019-05-25 MED ORDER — MIDAZOLAM HCL 2 MG/2ML IJ SOLN
INTRAMUSCULAR | Status: AC
  Filled 2019-05-25: qty 2

## 2019-05-25 MED ORDER — SUCCINYLCHOLINE CHLORIDE 200 MG/10ML IV SOSY
PREFILLED_SYRINGE | INTRAVENOUS | Status: AC
  Filled 2019-05-25: qty 10

## 2019-05-25 MED ORDER — FENTANYL CITRATE (PF) 250 MCG/5ML IJ SOLN
INTRAMUSCULAR | Status: AC
  Filled 2019-05-25: qty 5

## 2019-05-25 SURGICAL SUPPLY — 57 items
BANDAGE ESMARK 6X9 LF (GAUZE/BANDAGES/DRESSINGS) IMPLANT
BLADE AVERAGE 25X9 (BLADE) ×2 IMPLANT
BLADE SURG 10 STRL SS (BLADE) ×1 IMPLANT
BNDG CMPR 9X6 STRL LF SNTH (GAUZE/BANDAGES/DRESSINGS)
BNDG COHESIVE 4X5 TAN STRL (GAUZE/BANDAGES/DRESSINGS) ×1 IMPLANT
BNDG ELASTIC 4X5.8 VLCR STR LF (GAUZE/BANDAGES/DRESSINGS) ×1 IMPLANT
BNDG ESMARK 6X9 LF (GAUZE/BANDAGES/DRESSINGS)
BNDG GAUZE ELAST 4 BULKY (GAUZE/BANDAGES/DRESSINGS) ×3 IMPLANT
CNTNR URN SCR LID CUP LEK RST (MISCELLANEOUS) IMPLANT
CONT SPEC 4OZ STRL OR WHT (MISCELLANEOUS) ×4
COVER SURGICAL LIGHT HANDLE (MISCELLANEOUS) ×1 IMPLANT
COVER WAND RF STERILE (DRAPES) ×1 IMPLANT
CUFF TOURN SGL QUICK 34 (TOURNIQUET CUFF)
CUFF TOURN SGL QUICK 42 (TOURNIQUET CUFF) IMPLANT
CUFF TRNQT CYL 34X4.125X (TOURNIQUET CUFF) IMPLANT
DECANTER SPIKE VIAL GLASS SM (MISCELLANEOUS) ×3 IMPLANT
DRAPE U-SHAPE 47X51 STRL (DRAPES) ×2 IMPLANT
DRSG PAD ABDOMINAL 8X10 ST (GAUZE/BANDAGES/DRESSINGS) ×1 IMPLANT
DURAPREP 26ML APPLICATOR (WOUND CARE) ×1 IMPLANT
ELECT REM PT RETURN 9FT ADLT (ELECTROSURGICAL) ×2
ELECTRODE REM PT RTRN 9FT ADLT (ELECTROSURGICAL) ×1 IMPLANT
GAUZE SPONGE 4X4 12PLY STRL (GAUZE/BANDAGES/DRESSINGS) ×2 IMPLANT
GAUZE XEROFORM 5X9 LF (GAUZE/BANDAGES/DRESSINGS) ×1 IMPLANT
GLOVE BIO SURGEON STRL SZ8 (GLOVE) ×3 IMPLANT
GLOVE BIOGEL PI IND STRL 8 (GLOVE) ×1 IMPLANT
GLOVE BIOGEL PI INDICATOR 8 (GLOVE)
GLOVE ORTHO TXT STRL SZ7.5 (GLOVE) ×1 IMPLANT
GOWN STRL REUS W/ TWL LRG LVL3 (GOWN DISPOSABLE) ×1 IMPLANT
GOWN STRL REUS W/ TWL XL LVL3 (GOWN DISPOSABLE) ×4 IMPLANT
GOWN STRL REUS W/TWL LRG LVL3 (GOWN DISPOSABLE) ×4
GOWN STRL REUS W/TWL XL LVL3 (GOWN DISPOSABLE) ×2
KIT BASIN OR (CUSTOM PROCEDURE TRAY) ×2 IMPLANT
KIT TURNOVER KIT B (KITS) ×2 IMPLANT
NDL 25GX 5/8IN NON SAFETY (NEEDLE) ×1 IMPLANT
NDL HYPO 25GX1X1/2 BEV (NEEDLE) IMPLANT
NEEDLE 25GX 5/8IN NON SAFETY (NEEDLE) IMPLANT
NEEDLE HYPO 25GX1X1/2 BEV (NEEDLE) ×4 IMPLANT
NS IRRIG 1000ML POUR BTL (IV SOLUTION) ×2 IMPLANT
PACK ORTHO EXTREMITY (CUSTOM PROCEDURE TRAY) ×2 IMPLANT
PAD ARMBOARD 7.5X6 YLW CONV (MISCELLANEOUS) ×4 IMPLANT
PAD CAST 4YDX4 CTTN HI CHSV (CAST SUPPLIES) ×1 IMPLANT
PADDING CAST COTTON 4X4 STRL (CAST SUPPLIES)
SPONGE LAP 18X18 RF (DISPOSABLE) ×2 IMPLANT
STAPLER VISISTAT 35W (STAPLE) ×1 IMPLANT
STOCKINETTE IMPERVIOUS LG (DRAPES) IMPLANT
SUT ETHILON 2 0 PSLX (SUTURE) ×3 IMPLANT
SUT PROLENE 2 0 SH 30 (SUTURE) ×1 IMPLANT
SUT PROLENE 3 0 PS 2 (SUTURE) ×2 IMPLANT
SWAB COLLECTION DEVICE MRSA (MISCELLANEOUS) ×1 IMPLANT
SWAB CULTURE ESWAB REG 1ML (MISCELLANEOUS) IMPLANT
SWAB CULTURE LIQUID MINI MALE (MISCELLANEOUS) ×1 IMPLANT
SYR CONTROL 10ML LL (SYRINGE) ×2 IMPLANT
TOWEL GREEN STERILE (TOWEL DISPOSABLE) ×2 IMPLANT
TOWEL GREEN STERILE FF (TOWEL DISPOSABLE) ×1 IMPLANT
TUBE CONNECTING 12X1/4 (SUCTIONS) ×2 IMPLANT
WATER STERILE IRR 1000ML POUR (IV SOLUTION) ×2 IMPLANT
YANKAUER SUCT BULB TIP NO VENT (SUCTIONS) ×2 IMPLANT

## 2019-05-25 NOTE — Anesthesia Postprocedure Evaluation (Signed)
Anesthesia Post Note  Patient: Tammy Good  Procedure(s) Performed: AMPUTATION RAY 5th (Left )     Patient location during evaluation: PACU Anesthesia Type: MAC Level of consciousness: awake Pain management: pain level controlled Vital Signs Assessment: post-procedure vital signs reviewed and stable Respiratory status: spontaneous breathing Cardiovascular status: stable Postop Assessment: no apparent nausea or vomiting Anesthetic complications: no    Last Vitals:  Vitals:   05/25/19 1320 05/25/19 1359  BP: 131/61 (!) 147/61  Pulse: 91 86  Resp: (!) 21 16  Temp: 36.7 C 36.7 C  SpO2: 98% 98%    Last Pain:  Vitals:   05/25/19 1359  TempSrc: Oral  PainSc:                  Moncerrat Burnstein

## 2019-05-25 NOTE — Transfer of Care (Signed)
Immediate Anesthesia Transfer of Care Note  Patient: Tammy Good  Procedure(s) Performed: AMPUTATION RAY 5th (Left )  Patient Location: PACU  Anesthesia Type:MAC  Level of Consciousness: awake, alert  and patient cooperative  Airway & Oxygen Therapy: Patient Spontanous Breathing  Post-op Assessment: Report given to RN and Post -op Vital signs reviewed and stable  Post vital signs: Reviewed and stable  Last Vitals:  Vitals Value Taken Time  BP 131/61 05/25/19 1319  Temp    Pulse 91 05/25/19 1319  Resp 21 05/25/19 1319  SpO2 98 % 05/25/19 1319  Vitals shown include unvalidated device data.  Last Pain:  Vitals:   05/25/19 0828  TempSrc: Oral  PainSc:          Complications: No apparent anesthesia complications

## 2019-05-25 NOTE — Anesthesia Preprocedure Evaluation (Addendum)
Anesthesia Evaluation  Patient identified by MRN, date of birth, ID band Patient awake    Reviewed: Allergy & Precautions, NPO status , Patient's Chart, lab work & pertinent test results  Airway Mallampati: II  TM Distance: >3 FB     Dental   Pulmonary former smoker,    breath sounds clear to auscultation       Cardiovascular hypertension, + CAD and + Past MI   Rhythm:Regular Rate:Normal     Neuro/Psych    GI/Hepatic negative GI ROS, Neg liver ROS,   Endo/Other  diabetesHypothyroidism   Renal/GU negative Renal ROS     Musculoskeletal   Abdominal   Peds  Hematology  (+) anemia ,   Anesthesia Other Findings   Reproductive/Obstetrics                             Anesthesia Physical Anesthesia Plan  ASA: III  Anesthesia Plan: MAC   Post-op Pain Management:    Induction: Intravenous  PONV Risk Score and Plan: 3 and Propofol infusion and Ondansetron  Airway Management Planned: Nasal Cannula and Simple Face Mask  Additional Equipment:   Intra-op Plan:   Post-operative Plan: Extubation in OR  Informed Consent: I have reviewed the patients History and Physical, chart, labs and discussed the procedure including the risks, benefits and alternatives for the proposed anesthesia with the patient or authorized representative who has indicated his/her understanding and acceptance.     Dental advisory given  Plan Discussed with: CRNA and Anesthesiologist  Anesthesia Plan Comments:        Anesthesia Quick Evaluation

## 2019-05-25 NOTE — Anesthesia Procedure Notes (Signed)
Procedure Name: MAC Date/Time: 05/25/2019 12:08 PM Performed by: Janace Litten, CRNA Pre-anesthesia Checklist: Patient identified, Emergency Drugs available, Suction available and Patient being monitored Patient Re-evaluated:Patient Re-evaluated prior to induction Oxygen Delivery Method: Simple face mask

## 2019-05-25 NOTE — Brief Op Note (Signed)
05/25/2019  1:12 PM  PATIENT:  Tammy Good  69 y.o. female  PRE-OPERATIVE DIAGNOSIS:  Osteomyelitis  POST-OPERATIVE DIAGNOSIS:  Osteomyelitis  PROCEDURE:  Procedure(s): AMPUTATION RAY 5th (Left)  SURGEON:  Surgeon(s) and Role:    * Trula Slade, DPM - Primary  PHYSICIAN ASSISTANT:   ASSISTANTS: none   ANESTHESIA:   IV sedation  EBL:  50 mL   BLOOD ADMINISTERED:none  DRAINS: none   LOCAL MEDICATIONS USED:  OTHER 20cc lidocaine and marcaine plain  SPECIMEN:  Left 5th ray for pathology, wound culture, clean margin microbiology  DISPOSITION OF SPECIMEN:  PATHOLOGY  COUNTS:  YES  TOURNIQUET:   Total Tourniquet Time Documented: Calf (Left) - 22 minutes Total: Calf (Left) - 22 minutes   DICTATION: .Viviann Spare Dictation  PLAN OF CARE: Admit to inpatient   PATIENT DISPOSITION:  PACU - hemodynamically stable.   Delay start of Pharmacological VTE agent (>24hrs) due to surgical blood loss or risk of bleeding: no

## 2019-05-25 NOTE — Progress Notes (Signed)
Progress Note    Tammy Good  [id number] DOB: 11/20/1950  DOA: 05/23/2019 PCP: [name], MD      Brief Narrative:    Medical records reviewed and are as summarized below:  Tammy Good is an 69 y.o. 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  Patient states she developed a small blister about a month ago and was given 2 course of antibiotics antibiotics.    About 1 week prior to admission, the blisters opened and an ulcer was found.  She went to her podiatrist on the day before admission and she was referred to the emergency room for further evaluation.  MRI of the left foot showed septic arthritis at the fifth MTP joint and advanced osteomyelitis involving the fifth metatarsal and fifth proximal phalanx, cellulitis and myofascitis of the left foot.     Assessment/Plan:   Principal Problem:   Osteomyelitis of left foot (HCC) Active Problems:   CAD S/P percutaneous coronary angioplasty   Diabetes mellitus type 2 in obese [name]   Essential hypertension   Osteomyelitis (HCC)  Septic arthritis at the left MTP joint, advanced osteomyelitis involving fifth metatarsal and fifth proximal phalanx, cellulitis and myofascitis of the left foot, 2 open wounds on the left foot: Continue empiric IV antibiotics.  Plan for left toe ray amputation today.    Peripheral vascular disease: Left ABI 0.64 with TBI of 0.35.  Consulted Dr. Trula Slade, vascular surgeon for further evaluation.  CAD with previous coronary stent: Continue aspirin and Lipitor  Insulin-dependent type 2 diabetes mellitus: Continue Lantus and NovoLog as needed.  Monitor glucose level closely.  Hypertension: Lisinopril on hold  AKI: Creatinine is improving.  Continue to monitor  Chronic  anemia: H&H stable.  Thrombocytosis: This is likely reactive secondary to infection.  Continue to monitor.  History of panhypopituitarism: Continue dexamethasone and Synthroid  Body mass index is 35.12 kg/m.  (Morbid obesity)   Family Communication/Anticipated D/C date and plan/Code Status   DVT prophylaxis: To be determined.  Likely Lovenox after surgery Code Status: Full code Family Communication: Discussed with patient Disposition Plan: Patient is from home.  Plan is to discharge patient home when cleared for discharge by podiatrist and vascular surgeon.  Patient will be discharged when evaluation for PVD and surgical management is complete.      Subjective:   No pain in the left foot.  No shortness of breath or chest pain.  Objective:    Vitals:   05/24/19 1528 05/24/19 1940 05/25/19 0433 05/25/19 0828  BP: (!) 116/40 (!) 145/51 (!) 124/51 (!) 161/73  Pulse: 83 87 80 84  Resp:  17 16 17   Temp: 98.4 F (36.9 C) 98.1 F (36.7 C) 98.4 F (36.9 C) 98.4 F (36.9 C)  TempSrc: Oral Oral Oral Oral  SpO2: 97% 100% 97% 99%  Weight:      Height:        Intake/Output Summary (Last 24 hours) at 05/25/2019 0951 Last data filed at 05/25/2019 0600 Gross per 24 hour  Intake 551.53 ml  Output --  Net 551.53 ml   Filed Weights   05/24/19 0110  Weight: 101.7 kg    Exam:  GEN: NAD SKIN: No rash. 2 open wounds adjacent to each other on left foot EYES: EOMI ENT:  MMM CV: RRR PULM: CTA B ABD: soft, obese, NT, +BS CNS: AAO x 3, non focal EXT: No edema or tenderness   Data Reviewed:   I have personally reviewed following labs and imaging studies:  Labs: Labs show the following:   Basic Metabolic Panel: Recent Labs  Lab 05/23/19 1552 05/23/19 1552 05/24/19 0156 05/25/19 0301  NA 137  --  135 140  K 4.1   < > 4.1 4.0  CL 100  --  102 109  CO2 24  --  21* 23  GLUCOSE 237*  --  160* 150*  BUN 23  --  26* 16  CREATININE 1.65*  --  1.82* 1.34*  CALCIUM 8.4*   --  7.9* 8.0*   < > = values in this interval not displayed.   GFR Estimated Creatinine Clearance: 50.6 mL/min (A) (by C-G formula based on SCr of 1.34 mg/dL (H)). Liver Function Tests: Recent Labs  Lab 05/23/19 1552  AST 16  ALT 16  ALKPHOS 85  BILITOT 0.6  PROT 7.2  ALBUMIN 2.8*   No results for input(s): LIPASE, AMYLASE in the last 168 hours. No results for input(s): AMMONIA in the last 168 hours. Coagulation profile No results for input(s): INR, PROTIME in the last 168 hours.  CBC: Recent Labs  Lab 05/23/19 1757 05/24/19 0156 05/25/19 0301  WBC 9.4 9.2 8.1  NEUTROABS 7.1  --   --   HGB 11.2* 9.8* 9.3*  HCT 36.4 31.3* 30.2*  MCV 94.1 91.3 92.6  PLT 620* 518* 507*   Cardiac Enzymes: No results for input(s): CKTOTAL, CKMB, CKMBINDEX, TROPONINI in the last 168 hours. BNP (last 3 results) No results for input(s): PROBNP in the last 8760 hours. CBG: Recent Labs  Lab 05/24/19 0838 05/24/19 1120 05/24/19 1612 05/24/19 2056 05/25/19 0639  GLUCAP 111* 123* 162* 190* 105*   D-Dimer: No results for input(s): DDIMER in the last 72 hours. Hgb A1c: No results for input(s): HGBA1C in the last 72 hours. Lipid Profile: No results for input(s): CHOL, HDL, LDLCALC, TRIG, CHOLHDL, LDLDIRECT in the last 72 hours. Thyroid function studies: No results for input(s): TSH, T4TOTAL, T3FREE, THYROIDAB in the last 72 hours.  Invalid input(s): FREET3 Anemia work up: No results for input(s): VITAMINB12, FOLATE, FERRITIN, TIBC, IRON, RETICCTPCT in the last 72 hours. Sepsis Labs: Recent Labs  Lab 05/23/19 1552 05/23/19 1755 05/23/19 1757 05/24/19 0156 05/24/19 0741 05/25/19 0301  PROCALCITON  --   --   --   --  <0.10  --   WBC  --   --  9.4 9.2  --  8.1  LATICACIDVEN 3.6* 2.9*  --  2.4* 1.4  --     Microbiology Recent Results (from the past 240 hour(s))  WOUND CULTURE     Status: Abnormal   Collection Time: 05/18/19  5:00 PM   Specimen: Foot, Left; Wound  Result Value  Ref Range Status   MICRO NUMBER: [id number]  Final   SPECIMEN QUALITY: Adequate  Final   SOURCE: FOOT, LEFT  Final   STATUS: FINAL  Final   GRAM STAIN: Gram positive cocci in chains  Final    Comment: No white blood cells seen No epithelial cells seen Many Gram positive cocci in chains   ISOLATE 1: methicillin resistant Staphylococcus aureus (A)  Final    Comment: Heavy growth of Methicillin resistant Staphylococcus aureus (MRSA) This isolate demonstrates inducible clindamycin resistance.   ISOLATE 2: Streptococcus agalactiae (A)  Final    Comment:  Heavy growth of Group B Streptococcus isolated Beta-hemolytic streptococci are predictably susceptible to Penicillin and other beta-lactams. Susceptibility testing not routinely performed. Please contact the laboratory within 3 days if susceptibility  testing is desired.       Susceptibility   Methicillin resistant staphylococcus aureus - AEROBIC CULT, GRAM STAIN POSITIVE 1    VANCOMYCIN 1 Sensitive     CIPROFLOXACIN <=0.5 Sensitive     CLINDAMYCIN NR Resistant     LEVOFLOXACIN 0.25 Sensitive     ERYTHROMYCIN >=8 Resistant     GENTAMICIN <=0.5 Sensitive     OXACILLIN* NR Resistant      * Oxacillin-resistant staphylococci are resistant toall currently available beta-lactam antimicrobialagents including penicillins, beta lactam/beta-lactamase inhibitor combinations, and cephems withstaphylococcal indications, including Cefazolin.    TETRACYCLINE <=1 Sensitive     TRIMETH/SULFA* <=10 Sensitive      * Oxacillin-resistant staphylococci are resistant toall currently available beta-lactam antimicrobialagents including penicillins, beta lactam/beta-lactamase inhibitor combinations, and cephems withstaphylococcal indications, including Cefazolin.Legend:S = Susceptible  I = IntermediateR = Resistant  NS = Not susceptible* = Not tested  NR = Not reported**NN = See antimicrobic comments  Blood culture (routine x 2)     Status: None (Preliminary result)    Collection Time: 05/23/19 10:17 PM   Specimen: BLOOD  Result Value Ref Range Status   Specimen Description BLOOD LEFT ANTECUBITAL  Final   Special Requests   Final    BOTTLES DRAWN AEROBIC AND ANAEROBIC Blood Culture results may not be optimal due to an inadequate volume of blood received in culture bottles Performed at [facility] [address] [place], [place] [address]    Culture NO GROWTH < 24 HOURS  Final   Report Status PENDING  Incomplete  Blood culture (routine x 2)     Status: None (Preliminary result)   Collection Time: 05/23/19 10:33 PM   Specimen: BLOOD RIGHT HAND  Result Value Ref Range Status   Specimen Description BLOOD RIGHT HAND  Final   Special Requests   Final    BOTTLES DRAWN AEROBIC AND ANAEROBIC Blood Culture results may not be optimal due to an inadequate volume of blood received in culture bottles Performed at [facility], [place] [address] [place], [place] [address]    Culture NO GROWTH < 24 HOURS  Final   Report Status PENDING  Incomplete  SARS CORONAVIRUS 2 (TAT 6-24 HRS) Nasopharyngeal Nasopharyngeal Swab     Status: None   Collection Time: 05/24/19 12:25 AM   Specimen: Nasopharyngeal Swab  Result Value Ref Range Status   SARS Coronavirus 2 NEGATIVE NEGATIVE Final    Comment: (NOTE) SARS-CoV-2 target nucleic acids are NOT DETECTED. The SARS-CoV-2 RNA is generally detectable in upper and lower respiratory specimens during the acute phase of infection. Negative results do not preclude SARS-CoV-2 infection, do not rule out co-infections with other pathogens, and should not be used as the sole basis for treatment or other patient management decisions. Negative results must be combined with clinical observations, patient history, and epidemiological information. The expected result is Negative. Fact Sheet for Patients: SugarRoll.be Fact Sheet for Healthcare  Providers: https://www.woods-mathews.com/ This test is not yet approved or cleared by the [place] FDA and  has been authorized for detection and/or diagnosis of SARS-CoV-2 by FDA under an Emergency Use Authorization (EUA). This EUA will remain  in effect (meaning this test can be used) for the duration of the COVID-19 declaration under Section 56 4(b)(1) of the Act, 21 U.S.C.  section 360bbb-3(b)(1), unless the authorization is terminated or revoked sooner. Performed at [facility], [place] [address], [place], [place] [address]   Surgical pcr screen     Status: Abnormal   Collection Time: 05/24/19  1:25 PM   Specimen: Nasal Mucosa; Nasal Swab  Result Value Ref Range Status   MRSA, PCR POSITIVE (A) NEGATIVE Final    Comment: RESULT CALLED TO, READ BACK BY AND VERIFIED WITH: Eulogio Ditch RN 15:45 05/24/19 ([email or web address])    Staphylococcus aureus POSITIVE (A) NEGATIVE Final    Comment: (NOTE) The Xpert SA Assay (FDA approved for NASAL specimens in patients 21 years of age and older), is one component of a comprehensive surveillance program. It is not intended to diagnose infection nor to guide or monitor treatment. Performed at [facility], [place] [address], [place], [place] [address]     Procedures and diagnostic studies:  MR FOOT LEFT WO CONTRAST  Result Date: 05/24/2019 CLINICAL DATA:  Open wound on the lateral aspect of the foot. EXAM: MRI OF THE LEFT FOOT WITHOUT CONTRAST TECHNIQUE: Multiplanar, multisequence MR imaging of the left foot was performed. No intravenous contrast was administered. COMPARISON:  Radiographs 05/18/2019 FINDINGS: Large open wound noted on the lateral and dorsal aspect of the foot at the level of the mid distal fifth metatarsal. Associated small fluid collection at the base of the wound is likely a small abscess. There may be a small drain in this area also. Surrounding diffuse subcutaneous soft tissue swelling/edema/fluid consistent with  cellulitis. There is also moderate myofasciitis involving the lateral aspect of the forefoot. No findings suspicious for pyomyositis. Septic arthritis at the fifth MTP joint and advanced osteomyelitis involving the fifth metatarsal and fifth proximal phalanx. As demonstrated on the plain films there is destructive bony change. The other bony structures are intact. I do not see any other definite sites of osteomyelitis or septic arthritis. The major tendons and ligaments appear intact. IMPRESSION: 1. Large open wound on the lateral and dorsal aspect of the foot at the level of the mid distal fifth metatarsal. 2. Septic arthritis at the fifth MTP joint and advanced osteomyelitis involving the fifth metatarsal and fifth proximal phalanx. 3. Cellulitis and myofasciitis but no findings for pyomyositis. Electronically Signed   By: Marijo Sanes M.D.   On: 05/24/2019 08:02    Medications:   . aspirin EC  81 mg Oral Daily  . atorvastatin  40 mg Oral Daily  . Chlorhexidine Gluconate Cloth  6 each Topical Q0600  . dexamethasone  0.5 mg Oral Daily  . ferrous sulfate  325 mg Oral Q breakfast  . heparin  5,000 Units Subcutaneous Q8H  . insulin aspart  0-9 Units Subcutaneous TID WC  . insulin glargine  20 Units Subcutaneous QHS  . lactobacillus acidophilus  2 tablet Oral BID  . levothyroxine  175 mcg Oral QAC breakfast  . mupirocin ointment  1 application Nasal BID  . omega-3 acid ethyl esters  1 g Oral Daily  . pantoprazole  40 mg Oral BID AC  . sodium chloride flush  3 mL Intravenous Once   Continuous Infusions: . cefTRIAXone (ROCEPHIN)  IV 2 g (05/24/19 1042)   And  . metronidazole 500 mg (05/25/19 0153)  . vancomycin       LOS: 2 days   Eryanna Regal  [facility]     05/25/2019, 9:51 AM

## 2019-05-25 NOTE — Op Note (Signed)
  PATIENT:  Tammy Good  69 y.o. female  PRE-OPERATIVE DIAGNOSIS:  Osteomyelitis  POST-OPERATIVE DIAGNOSIS:  Osteomyelitis  PROCEDURE:  Procedure(s): AMPUTATION RAY 5th (Left)  SURGEON:  Surgeon(s) and Role:    * Trula Slade, DPM - Primary  PHYSICIAN ASSISTANT:   ASSISTANTS: none   ANESTHESIA:   IV sedation  EBL:  50 mL   BLOOD ADMINISTERED:none  DRAINS: none   LOCAL MEDICATIONS USED:  OTHER 20cc lidocaine and marcaine plain  SPECIMEN:  Left 5th ray for pathology, wound culture, clean margin microbiology  DISPOSITION OF SPECIMEN:  PATHOLOGY  COUNTS:  YES  TOURNIQUET:   Total Tourniquet Time Documented: Calf (Left) - 22 minutes Total: Calf (Left) - 22 minutes   DICTATION: .Viviann Spare Dictation  PLAN OF CARE: Admit to inpatient   PATIENT DISPOSITION:  PACU - hemodynamically stable.   Delay start of Pharmacological VTE agent (>24hrs) due to surgical blood loss or risk of bleeding: no   Indications for surgery: 69 year old female was admitted for osteomyelitis and worsening ulcerations of her left foot.  MRI confirmed osteomyelitis as well as septic fifth MPJ.  Arterial studies were performed which showed moderate arterial disease.  She is scheduled for angiogram tomorrow.  However due to the infection recommended at least partial fifth ray amputation.  We discussed pros and cons of this including all alternatives, risks, complications.  No promises or guarantees were given as that, the procedure and all questions were answered to the best my ability.  Procedure in detail: The patient was both verbally and visually identified by myself, the nursing staff, and the anesthesia staff in the preoperative holding area.  Transferred to the operative room via stretcher and placed on the operative table in supine position.  Tourniquet was applied making sure to pad bony prominences.  After an adequate plane of anesthesia a mixture of 10 cc of lidocaine, Marcaine plain  was infiltrated in a regional block fashion after timeout was performed.  The left lower extremities and scrubbed, prepped, draped in normal sterile fashion.  2 curvilinear incisions were planned starting on the proximal fifth metatarsal curving along both of the ulcerations to the fifth MPJ.  The incisions were made with a 10 blade scalpel excising a wedge of tissue.  This incision was circumferentially around the fifth MPJ the toe was disarticulated.  At this time the fifth metatarsal it was easily identified.  The distal aspect appeared to be nonviable and soft in nature.  It was incision was carried proximally to expose the fifth metatarsal.  She did have bleeding and a tourniquet was applied to ensure hemostasis to ensure any further blood loss.  This was set to 250 mmHg.  At this time a sagittal bone saw was utilized to resect two thirds of the metatarsal.  The remaining bone appeared to be viable.  Clean margin culture was obtained and sent to microbiology.  Also wound culture was obtained.  Nonviable tissue was debrided.  At this time the wound was partially closed with 2-0, 3-0 Prolene.  There is no proximal tracking identified.  During the procedure there is no purulence.  Tourniquet was released and hemostasis achieved.  The central wound was packed with a saline wet-to-dry sponge followed by dry sterile dressing.  Continue broad-spectrum antibiotics for now.  Await clean margin culture and wound culture.  Scheduled for angio tomorrow.  Will continue to follow closely  Celesta Gentile, DPM O: [phone] C: [phone]

## 2019-05-25 NOTE — Progress Notes (Signed)
ABI's have been completed. Preliminary results can be found in CV Proc through chart review.   05/25/19 9:34 AM Tammy Good RVT

## 2019-05-25 NOTE — Progress Notes (Signed)
Subjective: Tammy Good was admitted to the hospital on Monday for worsening ulcerations, cellulitis.  She is found to have septic arthritis, osteomyelitis fifth MPJ, metatarsal.  Because of this recommended fifth ray amputation.  She is scheduled for surgery for this today.  Vascular did see her today given abnormal arterial studies.  Scheduled for angio tomorrow.  Due to the infection we will still plan for fifth of amputation today. Denies any systemic complaints such as fevers, chills, nausea, vomiting. No acute changes since last appointment, and no other complaints at this time.   Objective: AAO x3, NAD-nervous, and preop Overall there is still 2 large ulcerations to the lateral aspect of the foot.  There is minimal warmth directly on the area but there is no ascending cellulitis.  No fluctuation crepitation. No open lesions or pre-ulcerative lesions.  No pain with calf compression, swelling, warmth, erythema  Assessment: Ulcerations, PAD left foot  Plan: -All treatment options discussed with the patient including all alternatives, risks, complications.  -Again discussed the surgeries as postoperative course we discussed alternatives, risks, complications.  Discussed pros and cons of limb salvage versus amputation.  She understands that she is a high risk of further amputation, delayed or nonhealing.  We will proceed with fifth amputation as scheduled today.  She is n.p.o. she has no further questions or concerns.  Again I offered to call family members but she declined to call before or after surgery.   Celesta Gentile, DPM O: [phone] C: [phone]

## 2019-05-25 NOTE — Progress Notes (Addendum)
Hospital Consult    Reason for Consult:  L foot ulcer Requesting Physician:  Dr. Loleta Books MRN #:  [id number]  History of Present Illness: This is a 68 y.o. female with PMH significant for insulin-dependent diabetes mellitus, hyperlipidemia, CAD with history of coronary stenting, morbid obesity, and PAD with left foot ulceration.  She was admitted with suspected osteomyelitis for IV antibiotics and surgical intervention.  She is scheduled for fifth toe ray amputation by [name] of podiatry.  Work-up also included ABIs which are indicative of infrainguinal occlusive disease.  She is on aspirin and statin daily.  She is a former smoker.  Past Medical History:  Diagnosis Date  . CAD S/P percutaneous coronary angioplasty 08/2013   100% mRCA - PCI Integrity Resolute DES 3.0 mm x 38 mm - 3.35 mm; PTCA of RPA V 2.0 mm x 15 mm  . Cholesteatoma    right  . Diabetes mellitus type 2 in obese (HCC)    On insulin and Invokana  . Hyperlipidemia with target LDL less than 70   . Hypothyroidism (acquired)   . Mild essential hypertension   . Obesity (BMI 30-39.9) 11/17/2013  . Panhypopituitarism ([place])   . ST elevation myocardial infarction (STEMI) of inferior wall, subsequent episode of care ([place]) 08/2013   80% branch of D1, 40% mid AV groove circumflex, 100% RCA with subacute thrombus -- thrombus extending into RPA V with 100% occlusion after initial angioplasty of mid RCA ;; Post MI ECHO 6/9/'15: EF 50-55%, mild LVH with moderate HK of inferior wall, Gr1 DD, mild LA dilation; mildly reduced RV function  . Status post insertion of drug eluting coronary artery stent to RCA emergently 09/16/2013    Past Surgical History:  Procedure Laterality Date  . Cardiac Event Monitor  July-August 2015   Sinus rhythm with PVCs  . COLONOSCOPY N/A 08/31/2013   Procedure: COLONOSCOPY;  Surgeon: Juanita Craver, MD;  Location: [facility] ENDOSCOPY;  Service: Endoscopy;  Laterality: N/A;  . ESOPHAGOGASTRODUODENOSCOPY N/A 09/01/2013     Procedure: ESOPHAGOGASTRODUODENOSCOPY (EGD);  Surgeon: Beryle Beams, MD;  Location: [facility] ENDOSCOPY;  Service: Endoscopy;  Laterality: N/A;  bedside  . LEFT HEART CATHETERIZATION WITH CORONARY ANGIOGRAM N/A 08/30/2013   Procedure: LEFT HEART CATHETERIZATION WITH CORONARY ANGIOGRAM;  Surgeon: Leonie Man, MD;  Location: [facility] CATH LAB: 100% mRCA (thrombus - extends to RPAV), 80% D1, 40% AVG Cx.  [name] PERCUTANEOUS CORONARY STENT INTERVENTION (PCI-S)  08/30/2013   Procedure: PERCUTANEOUS CORONARY STENT INTERVENTION (PCI-S);  Surgeon: Leonie Man, MD;  Location: [facility] CATH LAB;  Integrity Resolute DES 2.0 mm x 38 mm -- 3.35 mm.; PTCA of proximal RPA V. - 2.0 mm x 15 mm balloon  . PITUITARY SURGERY    . TRANSTHORACIC ECHOCARDIOGRAM  08/30/2013   mild LVH. EF 50-55%. Moderate HK of the entire inferior myocardium. GR 1 DD. Mild LA dilation. Mildly reduced RV function  . TYMPANOMASTOIDECTOMY Right 12/28/2017   Procedure: RIGHT TYMPANOMASTOIDECTOMY;  Surgeon: Leta Baptist, MD;  Location: [place];  Service: ENT;  Laterality: Right;    Allergies  Allergen Reactions  . Strawberry Extract Itching, Swelling and Anaphylaxis    Mouth swells and gets itchy    Prior to Admission medications   Medication Sig Start Date End Date Taking? Authorizing Provider  aspirin EC 81 MG tablet Take 81 mg by mouth daily.   Yes [provider]  atorvastatin (LIPITOR) 40 MG tablet Take 1 tablet (40 mg total) by mouth daily at 6 PM.  Patient taking differently: Take 40 mg by mouth every morning.  09/03/13  Yes Cherene Altes, MD  dexamethasone (DECADRON) 0.5 MG tablet Take 0.5 mg by mouth daily.  09/03/13  Yes Cherene Altes, MD  FARXIGA 10 MG TABS tablet Take 10 mg by mouth daily.  07/25/15  Yes [provider]  ferrous sulfate 325 (65 FE) MG tablet Take 325 mg by mouth daily with breakfast.   Yes [provider]  hydrochlorothiazide (HYDRODIURIL) 25 MG tablet Take 25 mg by mouth  daily.   Yes [provider]  Insulin Glargine (BASAGLAR KWIKPEN) 100 UNIT/ML SOPN Inject 52 Units into the skin at bedtime. inject 52 units into skin at bedtime 07/11/15  Yes [provider]  levothyroxine (SYNTHROID, LEVOTHROID) 175 MCG tablet Take 175 mcg by mouth daily before breakfast.   Yes [provider]  lisinopril (PRINIVIL,ZESTRIL) 20 MG tablet Take 20 mg by mouth daily.  09/03/13  Yes Cherene Altes, MD  metoprolol tartrate (LOPRESSOR) 25 MG tablet TAKE 1 TABLET TWICE A DAY Patient taking differently: Take 25 mg by mouth 2 (two) times daily.  02/04/17  Yes Leonie Man, MD  NOVOLOG FLEXPEN 100 UNIT/ML FlexPen Inject 5-14 Units into the skin 3 (three) times daily with meals. Per sliding scale 01/03/19  Yes [provider]  Omega-3 Fatty Acids (FISH OIL) 1000 MG CAPS Take 1,000 mg by mouth daily.   Yes [provider]  pantoprazole (PROTONIX) 40 MG tablet Take 1 tablet (40 mg total) by mouth 2 (two) times daily before a meal. 09/03/13  Yes Cherene Altes, MD  sulfamethoxazole-trimethoprim (BACTRIM) 400-80 MG tablet Take 1 tablet by mouth 2 (two) times daily. 05/19/19  Yes Gardiner Barefoot, DPM  ONETOUCH VERIO test strip USE 1 STRIP TO CHECK GLUCOSE THREE TIMES DAILY 12/24/17   [provider]    Social History   Socioeconomic History  . Marital status: Widowed    Spouse name: Not on file  . Number of children: Not on file  . Years of education: Not on file  . Highest education level: Not on file  Occupational History  . Not on file  Tobacco Use  . Smoking status: Former Research scientist (life sciences)  . Smokeless tobacco: Never Used  Substance and Sexual Activity  . Alcohol use: No  . Drug use: No  . Sexual activity: Not Currently    Birth control/protection: Post-menopausal  Other Topics Concern  . Not on file  Social History Narrative   Widow. Works at [employer].   Former smoker.   Overall not very active.  Hoping to get into  water aerobics class.   Social Determinants of Health   Financial Resource Strain:   . Difficulty of Paying Living Expenses: Not on file  Food Insecurity:   . Worried About Charity fundraiser in the Last Year: Not on file  . Ran Out of Food in the Last Year: Not on file  Transportation Needs:   . Lack of Transportation (Medical): Not on file  . Lack of Transportation (Non-Medical): Not on file  Physical Activity:   . Days of Exercise per Week: Not on file  . Minutes of Exercise per Session: Not on file  Stress:   . Feeling of Stress : Not on file  Social Connections:   . Frequency of Communication with Friends and Family: Not on file  . Frequency of Social Gatherings with Friends and Family: Not on file  . Attends  Religious Services: Not on file  . Active Member of Clubs or Organizations: Not on file  . Attends Archivist Meetings: Not on file  . Marital Status: Not on file  Intimate Partner Violence:   . Fear of Current or Ex-Partner: Not on file  . Emotionally Abused: Not on file  . Physically Abused: Not on file  . Sexually Abused: Not on file     Family History  Problem Relation Age of Onset  . Cancer Mother 34       multiple myeloma  . Heart attack Father 33  . Cancer Sister   . Alzheimer's disease Maternal Grandmother     ROS: Otherwise negative unless mentioned in HPI  Physical Examination  Vitals:   05/25/19 0433 05/25/19 0828  BP: (!) 124/51 (!) 161/73  Pulse: 80 84  Resp: 16 17  Temp: 98.4 F (36.9 C) 98.4 F (36.9 C)  SpO2: 97% 99%   Body mass index is 35.12 kg/m.  General:  WDWN in NAD Gait: Not observed HENT: WNL, normocephalic Pulmonary: normal non-labored breathing, without Rales, rhonchi,  wheezing Cardiac: regular Abdomen:  soft, NT/ND, no masses Skin: without rashes Vascular Exam/Pulses: Feet symmetrically warm to touch; no definitive popliteal pulses; femoral pulse exam limited due to body habitus Extremities: Extensive  tissue loss with ulcerations on dorsal lateral foot  Musculoskeletal: no muscle wasting or atrophy  Neurologic: A&O X 3;  No focal weakness or paresthesias are detected; speech is fluent/normal Psychiatric:  The pt has Normal affect. Lymph:  Unremarkable  CBC    Component Value Date/Time   WBC 8.1 05/25/2019 0301   RBC 3.26 (L) 05/25/2019 0301   HGB 9.3 (L) 05/25/2019 0301   HCT 30.2 (L) 05/25/2019 0301   PLT 507 (H) 05/25/2019 0301   MCV 92.6 05/25/2019 0301   MCH 28.5 05/25/2019 0301   MCHC 30.8 05/25/2019 0301   RDW 14.9 05/25/2019 0301   LYMPHSABS 1.3 05/23/2019 1757   MONOABS 0.8 05/23/2019 1757   EOSABS 0.1 05/23/2019 1757   BASOSABS 0.1 05/23/2019 1757    BMET    Component Value Date/Time   NA 140 05/25/2019 0301   NA 142 01/03/2019 1449   K 4.0 05/25/2019 0301   CL 109 05/25/2019 0301   CO2 23 05/25/2019 0301   GLUCOSE 150 (H) 05/25/2019 0301   BUN 16 05/25/2019 0301   BUN 10 01/03/2019 1449   CREATININE 1.34 (H) 05/25/2019 0301   CREATININE 0.74 09/16/2013 1344   CALCIUM 8.0 (L) 05/25/2019 0301   GFRNONAA 41 (L) 05/25/2019 0301   GFRAA 48 (L) 05/25/2019 0301    COAGS: Lab Results  Component Value Date   INR 1.22 08/29/2013     Non-Invasive Vascular Imaging:   Right ABI 0.67 Right TBI 0.33 Left ABI 0.64 Left TBI 0.35   ASSESSMENT/PLAN: This is a 69 y.o. female with ulceration left foot  Left ABI 0.64 with TBI of 0.35 indicative of infrainguinal occlusive disease Plans noted for toe ray amputation today by Podiatry She will need arterial disease evaluated by angiography Plan will be for aortogram with left lower extremity runoff and possible intervention tomorrow 05/26/19 On-call vascular surgeon Dr. Trula Slade will evaluate the patient later today and provide further treatment plans   Dagoberto Ligas PA-C [facility] [phone]  I agree with the above.  I have seen and evaluated the patient.  Briefly this is a 69 year old  female with a left fifth toe diabetic foot infection.  She underwent  left fifth toe amputation by [name] earlier today.  Her ABI on the left is 0.64.  I have recommended angiography to better define her anatomy with plans to optimize her blood flow either percutaneously or surgically.  I discussed the procedure in detail with the patient including the risks and benefits.  All of her questions were answered and she wants to proceed.  She understands that [name] will be doing her procedure.  She will be n.p.o. after midnight.  Annamarie Major

## 2019-05-26 ENCOUNTER — Inpatient Hospital Stay (HOSPITAL_COMMUNITY)
Admission: EM | Disposition: A | Discharge status: Home Health | Payer: Self-pay | Source: Home / Self Care | Attending: Internal Medicine

## 2019-05-26 DIAGNOSIS — I739 Peripheral vascular disease, unspecified: Secondary | ICD-10-CM

## 2019-05-26 HISTORY — PX: PERIPHERAL VASCULAR INTERVENTION: CATH118257

## 2019-05-26 HISTORY — PX: PERIPHERAL VASCULAR ATHERECTOMY: CATH118256

## 2019-05-26 HISTORY — DX: Peripheral vascular disease, unspecified: I73.9

## 2019-05-26 HISTORY — PX: ABDOMINAL AORTOGRAM W/LOWER EXTREMITY: CATH118223

## 2019-05-26 LAB — BASIC METABOLIC PANEL
Anion gap: 9 (ref 5–15)
BUN: 10 mg/dL (ref 8–23)
CO2: 24 mmol/L (ref 22–32)
Calcium: 8.5 mg/dL — ABNORMAL LOW (ref 8.9–10.3)
Chloride: 108 mmol/L (ref 98–111)
Creatinine, Ser: 0.93 mg/dL (ref 0.44–1.00)
GFR calc Af Amer: >60 mL/min (ref >60)
GFR calc non Af Amer: >60 mL/min (ref >60)
Glucose, Bld: 122 mg/dL — ABNORMAL HIGH (ref 70–99)
Potassium: 4.1 mmol/L (ref 3.5–5.1)
Sodium: 141 mmol/L (ref 135–145)

## 2019-05-26 LAB — ACID FAST SMEAR (AFB, MYCOBACTERIA)
Acid Fast Smear: NEGATIVE
Acid Fast Smear: NEGATIVE

## 2019-05-26 LAB — CBC
HCT: 31.4 % — ABNORMAL LOW (ref 36.0–46.0)
Hemoglobin: 9.6 g/dL — ABNORMAL LOW (ref 12.0–15.0)
MCH: 28.8 pg (ref 26.0–34.0)
MCHC: 30.6 g/dL (ref 30.0–36.0)
MCV: 94.3 fL (ref 80.0–100.0)
Platelets: 518 10*3/uL — ABNORMAL HIGH (ref 150–400)
RBC: 3.33 MIL/uL — ABNORMAL LOW (ref 3.87–5.11)
RDW: 15.3 % (ref 11.5–15.5)
WBC: 7.3 10*3/uL (ref 4.0–10.5)
nRBC: 0 % (ref 0.0–0.2)

## 2019-05-26 LAB — GLUCOSE, CAPILLARY
Glucose-Capillary: 93 mg/dL (ref 70–99)
Glucose-Capillary: 109 mg/dL — ABNORMAL HIGH (ref 70–99)
Glucose-Capillary: 186 mg/dL — ABNORMAL HIGH (ref 70–99)
Glucose-Capillary: 204 mg/dL — ABNORMAL HIGH (ref 70–99)
Glucose-Capillary: 147 mg/dL — ABNORMAL HIGH (ref 70–99)

## 2019-05-26 LAB — PROTIME-INR
INR: 1.1 (ref 0.8–1.2)
Prothrombin Time: 13.9 seconds (ref 11.4–15.2)

## 2019-05-26 SURGERY — ABDOMINAL AORTOGRAM W/LOWER EXTREMITY
Anesthesia: LOCAL

## 2019-05-26 MED ORDER — HYDRALAZINE HCL 20 MG/ML IJ SOLN: 5.0000 mg | INTRAMUSCULAR | Status: DC | PRN

## 2019-05-26 MED ORDER — HEPARIN SODIUM (PORCINE) 1000 UNIT/ML IJ SOLN
INTRAMUSCULAR | Status: DC | PRN
  Administered 2019-05-26: 2000 [IU] via INTRAVENOUS
  Administered 2019-05-26: 10000 [IU] via INTRAVENOUS

## 2019-05-26 MED ORDER — VERAPAMIL HCL 2.5 MG/ML IV SOLN
INTRAVENOUS | Status: AC
  Filled 2019-05-26: qty 2

## 2019-05-26 MED ORDER — LIDOCAINE HCL (PF) 1 % IJ SOLN
INTRAMUSCULAR | Status: DC | PRN
  Administered 2019-05-26: 20 mL

## 2019-05-26 MED ORDER — ONDANSETRON HCL 4 MG/2ML IJ SOLN
4.0000 mg | Freq: Four times a day (QID) | INTRAMUSCULAR | Status: DC | PRN
  Administered 2019-05-26 – 2019-05-27 (×2): 4 mg via INTRAVENOUS
  Filled 2019-05-26 (×2): qty 2

## 2019-05-26 MED ORDER — IODIXANOL 320 MG/ML IV SOLN
INTRAVENOUS | Status: DC | PRN
  Administered 2019-05-26: 160 mL via INTRA_ARTERIAL

## 2019-05-26 MED ORDER — HEPARIN SODIUM (PORCINE) 1000 UNIT/ML IJ SOLN
INTRAMUSCULAR | Status: AC
  Filled 2019-05-26: qty 1

## 2019-05-26 MED ORDER — NITROGLYCERIN IN D5W 200-5 MCG/ML-% IV SOLN
INTRAVENOUS | Status: AC
  Filled 2019-05-26: qty 250

## 2019-05-26 MED ORDER — LABETALOL HCL 5 MG/ML IV SOLN: 10.0000 mg | INTRAVENOUS | Status: DC | PRN

## 2019-05-26 MED ORDER — MIDAZOLAM HCL 2 MG/2ML IJ SOLN
INTRAMUSCULAR | Status: AC
  Filled 2019-05-26: qty 2

## 2019-05-26 MED ORDER — HEPARIN (PORCINE) IN NACL 1000-0.9 UT/500ML-% IV SOLN
INTRAVENOUS | Status: AC
  Filled 2019-05-26: qty 1000

## 2019-05-26 MED ORDER — FENTANYL CITRATE (PF) 100 MCG/2ML IJ SOLN
INTRAMUSCULAR | Status: AC
  Filled 2019-05-26: qty 2

## 2019-05-26 MED ORDER — ACETAMINOPHEN 325 MG PO TABS: 650.0000 mg | ORAL_TABLET | ORAL | Status: DC | PRN

## 2019-05-26 MED ORDER — SODIUM CHLORIDE 0.9 % IV SOLN: INTRAVENOUS | Status: AC

## 2019-05-26 MED ORDER — CLOPIDOGREL BISULFATE 300 MG PO TABS
ORAL_TABLET | ORAL | Status: DC | PRN
  Administered 2019-05-26: 300 mg via ORAL

## 2019-05-26 MED ORDER — HEPARIN SODIUM (PORCINE) 5000 UNIT/ML IJ SOLN: 5000.0000 [IU] | Freq: Three times a day (TID) | INTRAMUSCULAR | Status: DC

## 2019-05-26 MED ORDER — HEPARIN (PORCINE) IN NACL 1000-0.9 UT/500ML-% IV SOLN
INTRAVENOUS | Status: DC | PRN
  Administered 2019-05-26 (×2): 500 mL

## 2019-05-26 MED ORDER — SODIUM CHLORIDE 0.9% FLUSH: 3.0000 mL | INTRAVENOUS | Status: DC | PRN

## 2019-05-26 MED ORDER — SODIUM CHLORIDE 0.9 % IV SOLN: 250.0000 mL | INTRAVENOUS | Status: DC | PRN

## 2019-05-26 MED ORDER — FENTANYL CITRATE (PF) 100 MCG/2ML IJ SOLN
INTRAMUSCULAR | Status: DC | PRN
  Administered 2019-05-26 (×2): 25 ug via INTRAVENOUS

## 2019-05-26 MED ORDER — SODIUM CHLORIDE 0.9 % IV SOLN: INTRAVENOUS | Status: DC

## 2019-05-26 MED ORDER — LIDOCAINE HCL (PF) 1 % IJ SOLN
INTRAMUSCULAR | Status: AC
  Filled 2019-05-26: qty 30

## 2019-05-26 MED ORDER — CLOPIDOGREL BISULFATE 75 MG PO TABS
75.0000 mg | ORAL_TABLET | Freq: Every day | ORAL | Status: DC
  Administered 2019-05-27 – 2019-05-30 (×4): 75 mg via ORAL
  Filled 2019-05-26 (×4): qty 1

## 2019-05-26 MED ORDER — SODIUM CHLORIDE 0.9% FLUSH
3.0000 mL | Freq: Two times a day (BID) | INTRAVENOUS | Status: DC
  Administered 2019-05-26 – 2019-05-29 (×5): 3 mL via INTRAVENOUS

## 2019-05-26 MED ORDER — MIDAZOLAM HCL 2 MG/2ML IJ SOLN
INTRAMUSCULAR | Status: DC | PRN
  Administered 2019-05-26 (×2): 1 mg via INTRAVENOUS

## 2019-05-26 SURGICAL SUPPLY — 28 items
BALLN STERLING OTW 5X150X150 (BALLOONS) ×3
BALLN STERLING OTW 5X40X135 (BALLOONS) ×3
BALLOON STERLING OTW 5X150X150 (BALLOONS) IMPLANT
BALLOON STERLING OTW 5X40X135 (BALLOONS) IMPLANT
CATH OMNI FLUSH 5F 65CM (CATHETERS) ×1 IMPLANT
CATH QUICKCROSS SUPP .035X90CM (MICROCATHETER) ×1 IMPLANT
CLOSURE MYNX CONTROL 6F/7F (Vascular Products) ×1 IMPLANT
DCB RANGER 5.0X200 150 (BALLOONS) IMPLANT
DEVICE EMBOSHIELD NAV6 4.0-7.0 (FILTER) ×1 IMPLANT
DIAMONDBACK SOLID OAS 1.5MM (CATHETERS) ×3
GLIDEWIRE ADV .035X260CM (WIRE) ×1 IMPLANT
KIT ENCORE 26 ADVANTAGE (KITS) ×1 IMPLANT
KIT MICROPUNCTURE NIT STIFF (SHEATH) ×1 IMPLANT
KIT PV (KITS) ×3 IMPLANT
LUBRICANT VIPERSLIDE CORONARY (MISCELLANEOUS) ×1 IMPLANT
RANGER DCB 5.0X200 150 (BALLOONS) ×3
SHEATH FLEXOR ANSEL 1 7F 45CM (SHEATH) ×1 IMPLANT
SHEATH PINNACLE 5F 10CM (SHEATH) ×2 IMPLANT
SHEATH PINNACLE 7F 10CM (SHEATH) ×1 IMPLANT
SHEATH PROBE COVER 6X72 (BAG) ×1 IMPLANT
STENT ELUVIA 6X40X130 (Permanent Stent) ×1 IMPLANT
SYR MEDRAD MARK V 150ML (SYRINGE) ×1 IMPLANT
SYSTEM DIMNDBCK SLD OAS 1.5MM (CATHETERS) IMPLANT
TRANSDUCER W/STOPCOCK (MISCELLANEOUS) ×3 IMPLANT
TRAY PV CATH (CUSTOM PROCEDURE TRAY) ×3 IMPLANT
WIRE HITORQ VERSACORE ST 145CM (WIRE) ×1 IMPLANT
WIRE ROSEN-J .035X180CM (WIRE) ×1 IMPLANT
WIRE VIPER ADVANCE .017X335CM (WIRE) ×1 IMPLANT

## 2019-05-26 NOTE — Progress Notes (Signed)
Subjective: POD #1 s/p left foot partial fifth ray amputation with partial closure.  She said that she is doing well no pain to the foot.  Underwent angio today with vascular surgery.  Overall she said that she is doing well and denies any systemic complaints such as fevers, chills, nausea, vomiting.   Objective: AAO x3, NAD Status post partial fifth ray amputation with partial closure.  Sutures are intact on the proximal and distal aspect with a granular wound present on the central aspect.  There is no purulence.  There is mild edema.  There is no ascending erythema or ascending cellulitis.  No fluctuation or crepitation.  No malodor. No open lesions or pre-ulcerative lesions.  No pain with calf compression, swelling, warmth, erythema  Assessment: Postop day #1 status post left partial fourth ray amputation  Plan: Currently clean margin cultures are negative. I would like to have cultures before discharge.  For now I changed the dressing today with a wet-to-dry to the wound.  We will possibly apply wound VAC.  Encouraged elevation.   Celesta Gentile, DPM  O: [phone] C: [phone]

## 2019-05-26 NOTE — Plan of Care (Signed)
  Problem: Education: Goal: Knowledge of General Education information will improve Description Including pain rating scale, medication(s)/side effects and non-pharmacologic comfort measures Outcome: Progressing   Problem: Health Behavior/Discharge Planning: Goal: Ability to manage health-related needs will improve Outcome: Progressing   

## 2019-05-26 NOTE — Op Note (Signed)
Patient name: Tammy Good MRN: [id number] DOB: 08/26/1950 Sex: female  05/26/2019 Pre-operative Diagnosis: Left foot wound status post left fifth ray amputation in the setting of depressed ABIs and underlying peripheral arterial disease Post-operative diagnosis:  Same Surgeon:  Marty Heck, MD Procedure Performed: 1.  Ultrasound-guided access of the right common femoral artery 2.  Aortogram 3.  Bilateral lower extremity arteriogram with runoff including selection of third order branches in the left lower extremity 4.  Left SFA revascularization with mechanical orbital atherectomy (1.5 CSI solid) 5.  Left SFA angioplasty including drug-coated balloon (5 mm x 150 mm Sterling and 5 mm x 200 mm drug-coated Ranger) 6.  Left SFA proximal stent placement for focal dissection (6 mm x 40 mm drug-coated Eluvia) 7.  Mynx closure of the right common femoral artery 8.  92 minutes of monitored moderate conscious sedation time   Indications: 69 year old female that presented with left foot wound and had depressed ABIs of 0.6 in the left lower extremity.  Ultimately she underwent ray amputation of the left fifth toe with podiatry yesterday.  She presents today for planned revascularization after risk and benefits are discussed.  Findings:   Aortogram showed no flow-limiting aortoiliac stenosis.  Left lower extremity arteriogram showed a patent common femoral and profunda but a very diseased SFA over approximate 200 mm course with multiple focal lesions in the 50 to 60% stenotic range.  The above and below-knee popliteal artery are widely patent and the patient has three-vessel runoff.    Ultimately the left SFA was crossed and mechanical atherectomy was performed at low medium and high speeds and then this was angioplastied with initially a 5 mm balloon and then a 5 mm drug-coated balloon.  There was a focal dissection in the proximal SFA that was stented with a short 6 x 40 drug-coated  Eluvia.  There is now inline flow down the SFA with no residual stenosis and preserved three-vessel runoff.  Right lower extremity shows heavily calcified and diseased SFA very consistent with her disease distribution in the left lower extremity.  She appears to have patent popliteal and least two-vessel runoff in the anterior tibial and posterior tibial artery (difficult to evaluate peroneal).    Procedure:  The patient was identified in the holding area and taken to room 8.  The patient was then placed supine on the table and prepped and draped in the usual sterile fashion.  A time out was called.  Ultrasound was used to evaluate the right common femoral artery.  It was patent .  A digital ultrasound image was acquired.  A micropuncture needle was used to access the right common femoral artery under ultrasound guidance.  An 018 wire was advanced without resistance and a micropuncture sheath was placed.  The 018 wire was removed and a versacore wire was placed.  The micropuncture sheath was exchanged for a 5 french sheath.  Initially had some trouble advancing the sheath over the versa core and had to exchange for a Rosen wire for more support.  An omniflush catheter was advanced over the wire to the level of L-1.  An abdominal angiogram was obtained.  The catheter was pulled down and bilateral lower extremity runoff was obtained with pertinent findings noted above.  Given the planned intervention on the left lower extremity after review of imaging, we elected to treat the left SFA after reviewing the images.  Glidewire advantage was used with a Omni Flush catheter to select the left  iliac and ultimately advanced our catheter to the distal external iliac on the left side.  Ultimately a 7 [place] Ansell sheath was placed in the right groin over the aortic bifurcation into the left common femoral artery.  The patient was given 100 units/kg heparin IV.  Then used the Glidewire advantage with a quick cross catheter  and crossed the SFA disease into the above-knee popliteal artery.  Exchanged for a Dietitian.  Ultimately NAV6 6 embolic protection filter was deployed in the above-knee popliteal artery for distal embolic protection.  A 1.5 solid CSI atherectomy catheter was selected and mechanical atherectomy was performed of the proximal to mid SFA over approximate 200 mm length at low medium and high speeds.  We gave Viper slide throughout the case as well as nitroglycerin.  Ultimately we then ballooned this entire segment with a 5 mm Sterling.  On another injection through the sheath after the mechanical atherectomy device was removed there was a focal dissection in the proximal SFA.  I elected to then treat the SFA with a 5 mm drug-coated Ranger that was inflated to profile at low pressure.  We left this inflated for 3 minutes to try and treat the dissection.  I then did another hand-injection through the sheath that showed again this focal dissection in the proximal SFA.  At that point I thought we needed to stent in order to prevent the SFA from having any flow limitation.  A short 6 mm x 40 mm drug-coated Eluvia was selected and deployed in the proximal SFA across the dissection.  This was postdilated with a 5 mm balloon to nominal pressure.  We did one final injection down the left lower extremity and had excellent inline flow down the SFA popliteal and three-vessel runoff with no residual stenosis.  At that point in time the NAV filter was retrieved and the Viper wire was removed.  I then placed a Rosen wire back down through the sheath and we exchanged for a short 7 French sheath in the right common femoral artery.  A mynx closure device was deployed in the right common femoral artery and manual pressure was held.  She was taken to PACU in stable condition.      Plan: Patient was loaded on plavix after left SFA intervention and should remain on aspirin and plavix.  Marty Heck, MD [facility] [place] Office: [phone]

## 2019-05-26 NOTE — Progress Notes (Signed)
PROGRESS NOTE  Tammy Good  DOB: 12-20-1950  PCP: [name], MD [id number]  DOA: 05/23/2019 Admitted From: Home  LOS: 3 days   Chief Complaint  Patient presents with  . Wound Infection   Brief narrative: Tammy Good is an 69 y.o. 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 Patient was referred to the ER by patient's podiatrist after x-rays done last week showed features concerning for osteomyelitis of the left foot.   Patient states she developed a small blister about a month ago and was given 2 course of antibiotics antibiotics. About 1 week prior to admission, the blisters opened and an ulcer was found. She went to her podiatrist on the day before admission and she was referred to the emergency room for further evaluation. MRI of the left foot showed septic arthritis at the fifth MTP joint and advanced osteomyelitis involving the fifth metatarsal and fifth proximal phalanx, cellulitis and myofascitis of the left foot. Patient was admitted under hospitalist medicine service for further evaluation and management.  Subjective: Patient was seen and examined this afternoon.  Sitting at the edge of the bed.  Not in distress.  Just returned from vascular lab.  No new symptoms.  Assessment/Plan: Septic arthritis at the left MTP joint, advanced osteomyelitis involving fifth metatarsal and fifth proximal phalanx, cellulitis and myofascitis of the left foot -3/3-patient underwent fifth ray amputation by podiatrist [name]. -Patient is currently on IV antibiotics.  Peripheral vascular disease: Left ABI 0.64 with TBI of 0.35.   -3/4, patient underwent aortogram, bilateral lower extremity arteriogram with runoff, left SFA revascularization with atherectomy, left FSA angioplasty and stenting -Continue aspirin and statin  CAD with previous coronary stent: Continue aspirin and  Lipitor  Hypertension -Home meds include metoprolol, lisinopril, HCTZ.   -Continue metoprolol. HCTZ and lisinopril on hold. -Continue to monitor blood pressure.  Resume others tomorrow if creatinine is stable.  Insulin-dependent type 2 diabetes mellitus:  -Home meds include Farxiga, Lantus, NovoLog. -Continue same  Chronic anemia -Continue ferrous sulfate, Protonix,  AKI:  -Creatinine is improving.  Continue to monitor -Hemoglobin stable  History of panhypopituitarism: Continue dexamethasone and Synthroid  DVT prophylaxis:  Heparin subcu Antimicrobials:  IV ceftriaxone, IV vancomycin and IV Flagyl. Fluid: Normal saline at 100/h Diet: Cardiac/diabetic diet  Code Status:  Full code Mobility: Encourage ambulation Family Communication:  Discharge plan:  Anticipated date: Home when okay with podiatry and vascular.  Consultants:  Podiatry  Vascular surgery  Antimicrobials: Anti-infectives (From admission, onward)   Start     Dose/Rate Route Frequency Ordered Stop   05/25/19 2200  vancomycin (VANCOREADY) IVPB 1750 mg/350 mL  Status:  Discontinued     1,750 mg 175 mL/hr over 120 Minutes Intravenous Every 48 hours 05/24/19 0344 05/24/19 0925   05/25/19 2200  vancomycin (VANCOREADY) IVPB 1750 mg/350 mL     1,750 mg 175 mL/hr over 120 Minutes Intravenous Every 48 hours 05/24/19 1446     05/24/19 1000  cefTRIAXone (ROCEPHIN) 2 g in sodium chloride 0.9 % 100 mL IVPB     2 g 200 mL/hr over 30 Minutes Intravenous Every 24 hours 05/24/19 0925     05/24/19 1000  metroNIDAZOLE (FLAGYL) IVPB 500 mg     500 mg 100 mL/hr over 60 Minutes Intravenous Every 8 hours 05/24/19 0925     05/24/19 0600  piperacillin-tazobactam (ZOSYN) IVPB 3.375 g  Status:  Discontinued     3.375 g 12.5 mL/hr  over 240 Minutes Intravenous Every 8 hours 05/24/19 0320 05/24/19 0925   05/23/19 2200  vancomycin (VANCOREADY) IVPB 2000 mg/400 mL     2,000 mg 200 mL/hr over 120 Minutes Intravenous  Once  05/23/19 2158 05/24/19 0254   05/23/19 2200  piperacillin-tazobactam (ZOSYN) IVPB 3.375 g     3.375 g 100 mL/hr over 30 Minutes Intravenous  Once 05/23/19 2158 05/24/19 0020        Code Status: Full Code   Diet Order            Diet regular Room service appropriate? Yes; Fluid consistency: Thin  Diet effective now              Infusions:  . sodium chloride 100 mL/hr at 05/26/19 0607  . sodium chloride    . cefTRIAXone (ROCEPHIN)  IV 2 g (05/25/19 1009)   And  . metronidazole 500 mg (05/26/19 0427)  . vancomycin 1,750 mg (05/25/19 2205)    Scheduled Meds: . aspirin EC  81 mg Oral Daily  . atorvastatin  40 mg Oral Daily  . Chlorhexidine Gluconate Cloth  6 each Topical Q0600  . [START ON 05/27/2019] clopidogrel  75 mg Oral Q breakfast  . dexamethasone  0.5 mg Oral Daily  . ferrous sulfate  325 mg Oral Q breakfast  . heparin  5,000 Units Subcutaneous Q8H  . insulin aspart  0-9 Units Subcutaneous TID WC  . insulin glargine  20 Units Subcutaneous QHS  . lactobacillus acidophilus  2 tablet Oral BID  . levothyroxine  175 mcg Oral QAC breakfast  . mupirocin ointment  1 application Nasal BID  . omega-3 acid ethyl esters  1 g Oral Daily  . pantoprazole  40 mg Oral BID AC  . sodium chloride flush  3 mL Intravenous Once  . sodium chloride flush  3 mL Intravenous Q12H    PRN meds: sodium chloride, acetaminophen, hydrALAZINE, hydrALAZINE, labetalol, ondansetron (ZOFRAN) IV, ondansetron **OR** [DISCONTINUED] ondansetron (ZOFRAN) IV, sodium chloride flush   Objective: Vitals:   05/26/19 1250 05/26/19 1343  BP: (!) 153/45 132/68  Pulse: 84 (!) 107  Resp: 20 18  Temp:  98.2 F (36.8 C)  SpO2: 98% 100%    Intake/Output Summary (Last 24 hours) at 05/26/2019 1448 Last data filed at 05/25/2019 1504 Gross per 24 hour  Intake 100 ml  Output 501 ml  Net -401 ml   Filed Weights   05/24/19 0110 05/25/19 1137  Weight: 101.7 kg 101.7 kg   Weight change:  Body mass index is 35.12  kg/m.   Physical Exam: General exam: Appears calm and comfortable.  Skin: No rashes, lesions or ulcers. HEENT: Atraumatic, normocephalic, supple neck, no obvious bleeding Lungs: Clear to auscultation bilaterally CVS: Regular rate and rhythm, no murmur GI/Abd soft, nontender, nondistended, bowel sound. CNS: Alert, awake, oriented x3 Psychiatry: Mood appropriate Extremities: No pedal edema, no calf tenderness, left foot has a bandage on.  Data Review: I have personally reviewed the laboratory data and studies available.  Recent Labs  Lab 05/23/19 1757 05/24/19 0156 05/25/19 0301 05/26/19 0342  WBC 9.4 9.2 8.1 7.3  NEUTROABS 7.1  --   --   --   HGB 11.2* 9.8* 9.3* 9.6*  HCT 36.4 31.3* 30.2* 31.4*  MCV 94.1 91.3 92.6 94.3  PLT 620* 518* 507* 518*   Recent Labs  Lab 05/23/19 1552 05/24/19 0156 05/25/19 0301 05/26/19 0342  NA 137 135 140 141  K 4.1 4.1 4.0 4.1  CL 100 102  109 108  CO2 24 21* 23 24  GLUCOSE 237* 160* 150* 122*  BUN 23 26* 16 10  CREATININE 1.65* 1.82* 1.34* 0.93  CALCIUM 8.4* 7.9* 8.0* 8.5*    Signed, Terrilee Croak, MD [facility] Pager: [phone] (Secure Chat preferred). 05/26/2019

## 2019-05-26 NOTE — Progress Notes (Signed)
[facility]  Subjective  - s/p left 5th ray amp with podiatry yesterday.   Objective (!) 135/56 69 98.7 F (37.1 C) (Oral) 16 100%  Intake/Output Summary (Last 24 hours) at 05/26/2019 0754 Last data filed at 05/25/2019 1504 Gross per 24 hour  Intake 700 ml  Output 551 ml  Net 149 ml    Left foot wound as pictured yesterday now s/p left 5th ray amp  Laboratory Lab Results: Recent Labs    05/25/19 0301 05/26/19 0342  WBC 8.1 7.3  HGB 9.3* 9.6*  HCT 30.2* 31.4*  PLT 507* 518*   BMET Recent Labs    05/25/19 0301 05/26/19 0342  NA 140 141  K 4.0 4.1  CL 109 108  CO2 23 24  GLUCOSE 150* 122*  BUN 16 10  CREATININE 1.34* 0.93  CALCIUM 8.0* 8.5*    COAG Lab Results  Component Value Date   INR 1.1 05/26/2019   INR 1.22 08/29/2013   No results found for: PTT  Assessment/Planning:  Plan aortogram lower extremity arteriogram for tissue loss left lower extremity now s/p left 5th ray amp.  Marty Heck 05/26/2019 7:54 AM --

## 2019-05-27 ENCOUNTER — Ambulatory Visit (HOSPITAL_COMMUNITY)
Admission: RE | Admit: 2019-05-27 | Payer: BC Managed Care – PPO | Source: Ambulatory Visit | Attending: Podiatry | Admitting: Podiatry

## 2019-05-27 LAB — GLUCOSE, CAPILLARY
Glucose-Capillary: 110 mg/dL — ABNORMAL HIGH (ref 70–99)
Glucose-Capillary: 158 mg/dL — ABNORMAL HIGH (ref 70–99)
Glucose-Capillary: 129 mg/dL — ABNORMAL HIGH (ref 70–99)
Glucose-Capillary: 159 mg/dL — ABNORMAL HIGH (ref 70–99)

## 2019-05-27 LAB — CBC WITH DIFFERENTIAL/PLATELET
Abs Immature Granulocytes: 0.06 10*3/uL (ref 0.00–0.07)
Basophils Absolute: 0.1 10*3/uL (ref 0.0–0.1)
Basophils Relative: 2 %
Eosinophils Absolute: 0.3 10*3/uL (ref 0.0–0.5)
Eosinophils Relative: 4 %
HCT: 31.5 % — ABNORMAL LOW (ref 36.0–46.0)
Hemoglobin: 9.5 g/dL — ABNORMAL LOW (ref 12.0–15.0)
Immature Granulocytes: 1 %
Lymphocytes Relative: 24 %
Lymphs Abs: 1.6 10*3/uL (ref 0.7–4.0)
MCH: 28.3 pg (ref 26.0–34.0)
MCHC: 30.2 g/dL (ref 30.0–36.0)
MCV: 93.8 fL (ref 80.0–100.0)
Monocytes Absolute: 0.8 10*3/uL (ref 0.1–1.0)
Monocytes Relative: 12 %
Neutro Abs: 4 10*3/uL (ref 1.7–7.7)
Neutrophils Relative %: 57 %
Platelets: 450 10*3/uL — ABNORMAL HIGH (ref 150–400)
RBC: 3.36 MIL/uL — ABNORMAL LOW (ref 3.87–5.11)
RDW: 15.3 % (ref 11.5–15.5)
WBC: 6.8 10*3/uL (ref 4.0–10.5)
nRBC: 0 % (ref 0.0–0.2)

## 2019-05-27 LAB — BASIC METABOLIC PANEL
Anion gap: 9 (ref 5–15)
BUN: <5 mg/dL — ABNORMAL LOW (ref 8–23)
CO2: 22 mmol/L (ref 22–32)
Calcium: 8.3 mg/dL — ABNORMAL LOW (ref 8.9–10.3)
Chloride: 108 mmol/L (ref 98–111)
Creatinine, Ser: 0.75 mg/dL (ref 0.44–1.00)
GFR calc Af Amer: >60 mL/min (ref >60)
GFR calc non Af Amer: >60 mL/min (ref >60)
Glucose, Bld: 128 mg/dL — ABNORMAL HIGH (ref 70–99)
Potassium: 3.6 mmol/L (ref 3.5–5.1)
Sodium: 139 mmol/L (ref 135–145)

## 2019-05-27 LAB — MAGNESIUM: Magnesium: 1.6 mg/dL — ABNORMAL LOW (ref 1.7–2.4)

## 2019-05-27 LAB — SURGICAL PATHOLOGY

## 2019-05-27 MED ORDER — ONDANSETRON HCL 4 MG/2ML IJ SOLN
4.0000 mg | INTRAMUSCULAR | Status: DC | PRN
  Administered 2019-05-27 (×2): 4 mg via INTRAVENOUS
  Filled 2019-05-27: qty 2

## 2019-05-27 MED ORDER — VANCOMYCIN HCL 1750 MG/350ML IV SOLN
1750.0000 mg | INTRAVENOUS | Status: DC
  Administered 2019-05-27: 1750 mg via INTRAVENOUS
  Filled 2019-05-27 (×2): qty 350

## 2019-05-27 MED ORDER — MAGNESIUM SULFATE 2 GM/50ML IV SOLN
2.0000 g | Freq: Once | INTRAVENOUS | Status: AC
  Administered 2019-05-27: 2 g via INTRAVENOUS
  Filled 2019-05-27: qty 50

## 2019-05-27 MED ORDER — ONDANSETRON HCL 4 MG/2ML IJ SOLN
INTRAMUSCULAR | Status: AC
  Filled 2019-05-27: qty 2

## 2019-05-27 MED ORDER — POTASSIUM CHLORIDE CRYS ER 20 MEQ PO TBCR
40.0000 meq | EXTENDED_RELEASE_TABLET | Freq: Once | ORAL | Status: AC
  Administered 2019-05-27: 40 meq via ORAL
  Filled 2019-05-27: qty 2

## 2019-05-27 MED FILL — Nitroglycerin IV Soln 200 MCG/ML in D5W: INTRAVENOUS | Qty: 250 | Status: AC

## 2019-05-27 MED FILL — Verapamil HCl IV Soln 2.5 MG/ML: INTRAVENOUS | Qty: 2 | Status: AC

## 2019-05-27 NOTE — TOC Initial Note (Addendum)
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  Talked with the patient and the patient normally lives alone but after discharge from the hospital, the patient will be staying with [employer] at [address] [place], [place] [address].  Medicare choice given to the patient and advanced home health was her first choice - per patient's first choice, called [facility] and spoke with Butch Penny  - unable to accept due to staffing. Call made to patient's second choice, Meredeth Ide with [facility] accepted and will be providing RN support for dressing changes in the home upon discharge.  Patient currently does not have DME in the home.  The patient requesting RW - orders placed and called [employer] and RW ordered to be delivered tomorrow to her room.  No other barriers to discharge noted.  The patient's siter will be driving her home upon discharge.  Expected Discharge Plan: [place] Barriers to Discharge: No Barriers Identified   Patient Goals and CMS Choice Patient states their goals for this hospitalization and ongoing recovery are:: I ready to go home and visit with my sister for a while. CMS Medicare.gov Compare Post Acute Care list provided to:: Patient Choice offered to / list presented to : Patient  Expected Discharge Plan and Services Expected Discharge Plan: [place]   Discharge Planning Services: CM Consult Post Acute Care Choice: Durable Medical Equipment, Home Health Living arrangements for the past 2 months: Single Family Home                 DME Arranged: [place] rolling DME Agency: [facility] Date DME Agency Contacted: 05/27/19 Time  DME Agency Contacted: [phone] Representative spoke with at DME Agency: [place]: RN [place] Agency: [place] Date [place]: 05/27/19 Time [place]: 1708 Representative spoke with at [place]: [place] Arrangements/Services Living arrangements for the past 2 months: [place] Lives with:: Self Patient language and need for interpreter reviewed:: Yes Do you feel safe going back to the place where you live?: Yes      Need for Family Participation in Patient Care: Yes (Comment) Care giver support system in place?: Yes (comment)   Criminal Activity/Legal Involvement Pertinent to Current Situation/Hospitalization: No - Comment as needed  Activities of Daily Living Home Assistive Devices/Equipment: None ADL Screening (condition at time of admission) Patient's cognitive ability adequate to safely complete daily activities?: Yes Is the patient deaf or have difficulty hearing?: No Does the patient have difficulty seeing, even when wearing glasses/contacts?: No Does the patient have difficulty concentrating, remembering, or making decisions?: No Patient able to express need for assistance with ADLs?: Yes Does the patient have difficulty dressing or bathing?: No Independently performs ADLs?: Yes (appropriate for developmental age) Does the patient have difficulty walking or climbing stairs?: No Weakness of Legs: Left Weakness of Arms/Hands: None  Permission Sought/Granted Permission sought to share information with : Case Manager, Customer service manager, Family Supports Permission granted to share information with : Yes, Verbal Permission Granted     Permission granted  to share info w AGENCY: [facility], [employer]  Permission granted to share info w Relationship: sister, Erin Fulling     Emotional Assessment Appearance:: Appears stated age Attitude/Demeanor/Rapport: Engaged Affect (typically observed):  Accepting Orientation: : Oriented to Self, Oriented to Place, Oriented to  Time, Oriented to Situation Alcohol / Substance Use: Not Applicable Psych Involvement: No (comment)  Admission diagnosis:  Osteomyelitis ([place]) [M86.9] Subacute osteomyelitis of left foot [facility]) [id number] Patient Active Problem List   Diagnosis Date Noted  . Osteomyelitis of left foot ([place]) 05/24/2019  . Osteomyelitis ([place]) 05/23/2019  . Cellulitis and abscess of foot, except toes 05/18/2019  . Chronic osteomyelitis of ankle and foot ([place]) 05/18/2019  . Cellulitis and abscess of toe 11/09/2018  . Pre-operative cardiovascular examination 11/30/2017  . Iron deficiency anemia 12/10/2014  . Obesity (BMI 30-39.9) 11/17/2013  . Diabetes mellitus type 2 in obese ([place])   . Essential hypertension   . Right thigh pain 11/01/2013  . PVC's (premature ventricular contractions) 11/01/2013  . Status post insertion of drug eluting coronary artery stent to [facility] emergently and PTCA to prox. PLA 09/16/2013  . Hyperlipidemia LDL goal <70 09/16/2013  . Panhypopituitarism ([place]) 08/30/2013  . ST elevation myocardial infarction (STEMI) of inferior wall, subsequent episode of care ([place]) 08/22/2013  . CAD S/P percutaneous coronary angioplasty 08/22/2013   PCP:  [name], MD Pharmacy:   [place] [place] - [address] [place]) [place] [address] Phone: [phone] Fax: [phone]  [facility] [place] [facility] [place] [address] Phone: [phone] Fax: [place], [place] [address] [place] [address] Phone: [phone] Fax: [phone]     Social Determinants of Health (SDOH) Interventions    Readmission Risk Interventions No flowsheet data found.

## 2019-05-27 NOTE — Progress Notes (Signed)
Orthopedic Tech Progress Note Patient Details:  Tammy Good [birth year] [id number]  Ortho Devices Type of Ortho Device: Darco shoe Ortho Device/Splint Location: LLE Ortho Device/Splint Interventions: Ordered, Application   Post Interventions Patient Tolerated: Well Instructions Provided: Care of device, Adjustment of device   Janit Pagan 05/27/2019, 8:16 AM

## 2019-05-27 NOTE — Progress Notes (Signed)
PROGRESS NOTE  Tammy Good  DOB: 1950-11-29  PCP: Tammy Bowen, MD [id number]  DOA: 05/23/2019 Admitted From: Home  LOS: 4 days   Chief Complaint  Patient presents with  . Wound Infection   Brief narrative: Tammy Good is an 69 y.o. 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 Patient was referred to the ER by patient's podiatrist after x-rays done last week showed features concerning for osteomyelitis of the left foot.   Patient states she developed a small blister about a month ago and was given 2 course of antibiotics antibiotics. About 1 week prior to admission, the blisters opened and an ulcer was found. She went to her podiatrist on the day before admission and she was referred to the emergency room for further evaluation. MRI of the left foot showed septic arthritis at the fifth MTP joint and advanced osteomyelitis involving the fifth metatarsal and fifth proximal phalanx, cellulitis and myofascitis of the left foot. Patient was admitted under hospitalist medicine service for further evaluation and management.  Subjective: Patient was seen and examined this morning.  Sitting up on the chair.  This morning, patient had 5-6 episodes of vomiting.  Very nauseated.  Does not want to eat.  Assessment/Plan: Septic arthritis at the left MTP joint, advanced osteomyelitis involving fifth metatarsal and fifth proximal phalanx, cellulitis and myofascitis of the left foot -3/3-patient underwent fifth ray amputation by podiatrist [name]. -Patient is currently on IV Rocephin, IV Flagyl and IV vancomycin.  Culture has not shown growth so far. -We will discuss with podiatry regarding long-term antibiotic need.  Peripheral vascular disease: Left ABI 0.64 with TBI of 0.35.   -3/4, patient underwent aortogram, bilateral lower extremity arteriogram with runoff, left SFA revascularization with atherectomy,  left FSA angioplasty and stenting -Continue aspirin, Plavix and statin  Intractable nausea and vomiting -This morning, patient had 5-6 episodes of vomiting.  Very nauseated.  Does not want to eat. -?  Cause.  No history of diabetic gastroparesis. -Blood work from this morning post vomiting shows potassium at 3.6, renal function normal, magnesium low at 1.6. -Continue normal saline at 100 mill per hour.  Given potassium and magnesium supplementation. -Clear liquid diet, advance as tolerated.  CAD with previous coronary stent:  -Continue aspirin, Plavix and Lipitor  Hypertension -Home meds include metoprolol, lisinopril, HCTZ.   -Continue metoprolol. HCTZ and lisinopril remain on hold. -Continue to monitor blood pressure, creatinine, oral intake.  Insulin-dependent type 2 diabetes mellitus:  -Home meds include Farxiga, Lantus, NovoLog. -Continue all.  Continue sliding scale insulin with Accu-Cheks.  Chronic anemia -Continue ferrous sulfate, Protonix,  AKI  -Creatinine normal less than one at baseline. -On admission, creatinine was elevated to 1.65 -Gradually improving with IV fluid.  History of panhypopituitarism:  -Continue dexamethasone and Synthroid. -If unable to tolerate oral medicine, will switch to IV.  DVT prophylaxis:  Heparin subcu Antimicrobials:  IV ceftriaxone, IV vancomycin and IV Flagyl. Fluid: Normal saline at 100/h Diet: Clear liquid diet.  Advance as tolerated.  Code Status:  Full code Mobility: Encourage ambulation Family Communication:  Discharge plan:  Anticipated date: 3/7 most likely Disposition: Home Barriers: Pending culture data, intractable nausea, vomiting  Consultants:  Podiatry   vascular surgery  Antimicrobials: Anti-infectives (From admission, onward)   Start     Dose/Rate Route Frequency Ordered Stop   05/27/19 1200  vancomycin (VANCOREADY) IVPB 1750 mg/350 mL     1,750 mg 175 mL/hr over  120 Minutes Intravenous Every 24  hours 05/27/19 1006     05/25/19 2200  vancomycin (VANCOREADY) IVPB 1750 mg/350 mL  Status:  Discontinued     1,750 mg 175 mL/hr over 120 Minutes Intravenous Every 48 hours 05/24/19 0344 05/24/19 0925   05/25/19 2200  vancomycin (VANCOREADY) IVPB 1750 mg/350 mL  Status:  Discontinued     1,750 mg 175 mL/hr over 120 Minutes Intravenous Every 48 hours 05/24/19 1446 05/27/19 1006   05/24/19 1000  cefTRIAXone (ROCEPHIN) 2 g in sodium chloride 0.9 % 100 mL IVPB     2 g 200 mL/hr over 30 Minutes Intravenous Every 24 hours 05/24/19 0925     05/24/19 1000  metroNIDAZOLE (FLAGYL) IVPB 500 mg     500 mg 100 mL/hr over 60 Minutes Intravenous Every 8 hours 05/24/19 0925     05/24/19 0600  piperacillin-tazobactam (ZOSYN) IVPB 3.375 g  Status:  Discontinued     3.375 g 12.5 mL/hr over 240 Minutes Intravenous Every 8 hours 05/24/19 0320 05/24/19 0925   05/23/19 2200  vancomycin (VANCOREADY) IVPB 2000 mg/400 mL     2,000 mg 200 mL/hr over 120 Minutes Intravenous  Once 05/23/19 2158 05/24/19 0254   05/23/19 2200  piperacillin-tazobactam (ZOSYN) IVPB 3.375 g     3.375 g 100 mL/hr over 30 Minutes Intravenous  Once 05/23/19 2158 05/24/19 0020        Code Status: Full Code   Diet Order            Diet clear liquid Room service appropriate? Yes; Fluid consistency: Thin  Diet effective now              Infusions:  . sodium chloride 100 mL/hr at 05/26/19 0607  . sodium chloride    . cefTRIAXone (ROCEPHIN)  IV 2 g (05/27/19 1044)   And  . metronidazole 500 mg (05/27/19 1044)  . magnesium sulfate bolus IVPB    . vancomycin      Scheduled Meds: . aspirin EC  81 mg Oral Daily  . atorvastatin  40 mg Oral Daily  . Chlorhexidine Gluconate Cloth  6 each Topical Q0600  . clopidogrel  75 mg Oral Q breakfast  . dexamethasone  0.5 mg Oral Daily  . ferrous sulfate  325 mg Oral Q breakfast  . heparin  5,000 Units Subcutaneous Q8H  . insulin aspart  0-9 Units Subcutaneous TID WC  . insulin glargine   20 Units Subcutaneous QHS  . lactobacillus acidophilus  2 tablet Oral BID  . levothyroxine  175 mcg Oral QAC breakfast  . mupirocin ointment  1 application Nasal BID  . omega-3 acid ethyl esters  1 g Oral Daily  . pantoprazole  40 mg Oral BID AC  . potassium chloride  40 mEq Oral Once  . sodium chloride flush  3 mL Intravenous Once  . sodium chloride flush  3 mL Intravenous Q12H    PRN meds: sodium chloride, acetaminophen, hydrALAZINE, hydrALAZINE, labetalol, ondansetron (ZOFRAN) IV, ondansetron **OR** [DISCONTINUED] ondansetron (ZOFRAN) IV, sodium chloride flush   Objective: Vitals:   05/27/19 0849 05/27/19 1110  BP: 129/68 131/60  Pulse: (!) 106 91  Resp: (!) 30 15  Temp: 100.1 F (37.8 C) 98.4 F (36.9 C)  SpO2: 100% 97%    Intake/Output Summary (Last 24 hours) at 05/27/2019 1122 Last data filed at 05/27/2019 0416 Gross per 24 hour  Intake 243 ml  Output 950 ml  Net -707 ml   Filed Weights   05/24/19 0110  05/25/19 1137  Weight: 101.7 kg 101.7 kg   Weight change:  Body mass index is 35.12 kg/m.   Physical Exam: General exam: Mild distress because of persistent nausea and vomiting Skin: No rashes, lesions or ulcers. HEENT: Atraumatic, normocephalic, supple neck, no obvious bleeding Lungs: Clear to auscultation bilaterally CVS: Regular rate and rhythm, no murmur GI/Abd soft, mild epigastric tenderness, nondistended, bowel sound. CNS: Alert, awake, oriented x3 Psychiatry: Mood appropriate Extremities: No pedal edema, no calf tenderness, left foot has a bandage on.  Data Review: I have personally reviewed the laboratory data and studies available.  Recent Labs  Lab 05/23/19 1757 05/24/19 0156 05/25/19 0301 05/26/19 0342 05/27/19 0930  WBC 9.4 9.2 8.1 7.3 6.8  NEUTROABS 7.1  --   --   --  4.0  HGB 11.2* 9.8* 9.3* 9.6* 9.5*  HCT 36.4 31.3* 30.2* 31.4* 31.5*  MCV 94.1 91.3 92.6 94.3 93.8  PLT 620* 518* 507* 518* 450*   Recent Labs  Lab 05/23/19 1552  05/24/19 0156 05/25/19 0301 05/26/19 0342 05/27/19 0930  NA 137 135 140 141 139  K 4.1 4.1 4.0 4.1 3.6  CL 100 102 109 108 108  CO2 24 21* 23 24 22   GLUCOSE 237* 160* 150* 122* 128*  BUN 23 26* 16 10 <5*  CREATININE 1.65* 1.82* 1.34* 0.93 0.75  CALCIUM 8.4* 7.9* 8.0* 8.5* 8.3*  MG  --   --   --   --  1.6*    Signed, Terrilee Croak, MD [facility] Pager: [phone] (Secure Chat preferred). 05/27/2019

## 2019-05-27 NOTE — Progress Notes (Signed)
MOBILITY TEAM - Progress Note   05/27/19 1602  Mobility  Activity Ambulated to bathroom;Ambulated in hall  Level of Assistance Standby assist, set-up cues, supervision of patient - no hands on  Assistive Device Front wheel walker  Distance Ambulated (ft) 150 ft  Mobility Response Tolerated fair  Bed Position  (seated in recliner)   Pre-activity: HR 95 During activity: HR 127  Pt with improved nausea, but limited by SOB and chronic back pain. Improving stability with R darco shoe. Will need RW for d/c.   Mabeline Caras, PT, DPT Mobility Team Pager [phone]

## 2019-05-27 NOTE — Progress Notes (Addendum)
[facility]  Subjective  - Nausea.   Objective (!) 134/45 92 99.5 F (37.5 C) (Oral) (!) 22 98%  Intake/Output Summary (Last 24 hours) at 05/27/2019 0808 Last data filed at 05/27/2019 0416 Gross per 24 hour  Intake 243 ml  Output 950 ml  Net -707 ml    Left LE doppler signals PT/DP/Peroneal Left groin soft without hematoma Lungs non labored breathing   Assessment/Planning: POD # 1 1.  Ultrasound-guided access of the right common femoral artery 2.  Aortogram 3.  Bilateral lower extremity arteriogram with runoff including selection of third order branches in the left lower extremity 4.  Left SFA revascularization with mechanical orbital atherectomy (1.5 CSI solid) 5.  Left SFA angioplasty including drug-coated balloon (5 mm x 150 mm Sterling and 5 mm x 200 mm drug-coated Ranger) 6.  Left SFA proximal stent placement for focal dissection (6 mm x 40 mm drug-coated Eluvia) 7.  Mynx closure of the right common femoral artery 8.  92   Lipitor, Plavix and aspirin daily.   Will schedule f/u 4-6 weeks with Aarterial iliac, left LE duplex and ABI's.    Tammy Good 05/27/2019 8:08 AM --  Laboratory Lab Results: Recent Labs    05/25/19 0301 05/26/19 0342  WBC 8.1 7.3  HGB 9.3* 9.6*  HCT 30.2* 31.4*  PLT 507* 518*   BMET Recent Labs    05/25/19 0301 05/26/19 0342  NA 140 141  K 4.0 4.1  CL 109 108  CO2 23 24  GLUCOSE 150* 122*  BUN 16 10  CREATININE 1.34* 0.93  CALCIUM 8.0* 8.5*    COAG Lab Results  Component Value Date   INR 1.1 05/26/2019   INR 1.22 08/29/2013   No results found for: PTT  I have seen and evaluated the patient. I agree with the PA note as documented above. POD#1 s/p right CFA access and left SFA atherectomy with angioplasty stent placement.  Right groin looks good.  Left signals brisk.  Toe amp care per podiatry.  Will arrange follow-up one month in clinic with ABI and left leg arterial duplex.     Marty Heck, MD [facility] Office: [phone]

## 2019-05-27 NOTE — Progress Notes (Signed)
Pharmacy Antibiotic Note  Tammy Good is a 69 y.o. 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  Pharmacy has been consulted for Vancomycin.   Spoke with [name]: plans to continue antibiotics today and follow wound cultures for another 24 hours. Decision on antibiotics will be based on culture results (ngtd thus far)  Plan: -Change vancomycin to 1250mg  IV q24h (AUC= 458 using SCr= 0.9) -Continuing ceftriaxone and flagyl -Will follow renal function, cultures and clinical progress    Height: 5\' 7"  (170.2 cm) Weight: 224 lb 3.3 oz (101.7 kg) IBW/kg (Calculated) : 61.6  Temp (24hrs), Avg:99.2 F (37.3 C), Min:98.2 F (36.8 C), Max:100.1 F (37.8 C)  Recent Labs  Lab 05/23/19 1552 05/23/19 1755 05/23/19 1757 05/24/19 0156 05/24/19 0741 05/25/19 0301 05/26/19 0342  WBC  --   --  9.4 9.2  --  8.1 7.3  CREATININE 1.65*  --   --  1.82*  --  1.34* 0.93  LATICACIDVEN 3.6* 2.9*  --  2.4* 1.4  --   --     Estimated Creatinine Clearance: 72.9 mL/min (by C-G formula based on SCr of 0.93 mg/dL).    Allergies  Allergen Reactions  . Strawberry Extract Itching, Swelling and Anaphylaxis    Mouth swells and gets itchy    Hildred Laser, PharmD Clinical Pharmacist **Pharmacist phone directory can now be found on [email or web address] ([email or web address]).  Listed under [place].

## 2019-05-27 NOTE — Plan of Care (Signed)
  Problem: Education: Goal: Knowledge of General Education information will improve Description Including pain rating scale, medication(s)/side effects and non-pharmacologic comfort measures Outcome: Progressing   Problem: Health Behavior/Discharge Planning: Goal: Ability to manage health-related needs will improve Outcome: Progressing   

## 2019-05-27 NOTE — Care Management Important Message (Addendum)
Important Message  Patient Details  Name: Tammy Good MRN: [id number] Date of Birth: 1950/05/28   Medicare Important Message Given:  Yes  Pt. On precautions gave IM letter to NT Ria Comment)     [name] 05/27/2019, 10:52 AM

## 2019-05-27 NOTE — Progress Notes (Addendum)
MOBILITY TEAM - Progress Note   05/27/19 0943  Mobility  Activity Ambulated in room;Ambulated in hall  Level of Assistance Standby assist, set-up cues, supervision of patient - no hands on  Assistive Device Front wheel walker  Distance Ambulated (ft) 60 ft  Mobility Response Tolerated fair  Bed Position  (seated in recliner)   Patient moving well with L darco shoe donned and RW; mobility limited by nausea/vomiting (RN aware). Will check back for further mobilization as schedule permits.  Patient may need a RW for d/c home.  Mabeline Caras, PT, DPT Mobility Team Pager [phone]

## 2019-05-27 NOTE — Progress Notes (Signed)
Subjective: POD 2 s/p left foot partial fifth ray amputation with partial closure.  Overnight she has had nausea and vomiting.  She states that overall she is somewhat better but still nauseated. No active vomiting.  She denies any chest pain, shortness of breath.  No pain in the surgical site.    Objective: AAO x3, NAD Status post partial fifth ray amputation with partial closure.  Sutures are intact on the proximal and distal aspect with a granular wound present with some fibrotic tissue. There is no purulence.  There is improved edema.  There is no ascending erythema or ascending cellulitis.  No fluctuation or crepitation.  No malodor.  No open lesions or pre-ulcerative lesions.  No pain with calf compression, swelling, warmth, erythema        Assessment: Postop day #2 status post left partial fourth ray amputation  Plan: This morning the wound culture no growth however this afternoon it is showing rare staph aureus.  Still pending sensitivities.  Consider return to surgery for excision of further bone versus long-term antibiotics.  Due to nausea and vomiting will hold off on surgery for now and reevaluate tomorrow.  Continue antibiotics for now.  Postop shoe dispensed today.  Weightbearing as tolerated. Dr. Posey Pronto will see her over the weekend and I discussed the case with him.   Celesta Gentile, DPM  O: [phone] C: [phone]

## 2019-05-28 LAB — CULTURE, BLOOD (ROUTINE X 2)
Culture: NO GROWTH
Culture: NO GROWTH

## 2019-05-28 LAB — GLUCOSE, CAPILLARY
Glucose-Capillary: 95 mg/dL (ref 70–99)
Glucose-Capillary: 129 mg/dL — ABNORMAL HIGH (ref 70–99)
Glucose-Capillary: 207 mg/dL — ABNORMAL HIGH (ref 70–99)
Glucose-Capillary: 174 mg/dL — ABNORMAL HIGH (ref 70–99)

## 2019-05-28 MED ORDER — VANCOMYCIN HCL 1500 MG/300ML IV SOLN
1500.0000 mg | INTRAVENOUS | Status: DC
  Administered 2019-05-28 – 2019-05-30 (×3): 1500 mg via INTRAVENOUS
  Filled 2019-05-28 (×3): qty 300

## 2019-05-28 NOTE — Progress Notes (Signed)
Pharmacy Antibiotic Note  Tammy Good is a 69 y.o. 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  Pharmacy has been consulted for Vancomycin. Patient's renal function continues to improve with increased urine output. Creatinine today is 0.76 for an estimated CrCl of ~84 ml/min. She is afebrile. White blood cells remain within normal limits.   Goal AUC 400-550.  Plan: -Change vancomycin to 1500 mg IV q24h (AUC= 490 using SCr= 0.8) -Continuing ceftriaxone and flagyl -Will follow renal function, cultures and clinical progress    Height: 5\' 7"  (170.2 cm) Weight: 224 lb 3.3 oz (101.7 kg) IBW/kg (Calculated) : 61.6  Temp (24hrs), Avg:98.4 F (36.9 C), Min:98.1 F (36.7 C), Max:98.6 F (37 C)  Recent Labs  Lab 05/23/19 1552 05/23/19 1755 05/23/19 1757 05/24/19 0156 05/24/19 0741 05/25/19 0301 05/26/19 0342 05/27/19 0930  WBC  --   --  9.4 9.2  --  8.1 7.3 6.8  CREATININE 1.65*  --   --  1.82*  --  1.34* 0.93 0.75  LATICACIDVEN 3.6* 2.9*  --  2.4* 1.4  --   --   --     Estimated Creatinine Clearance: 84.7 mL/min (by C-G formula based on SCr of 0.75 mg/dL).    Allergies  Allergen Reactions  . Strawberry Extract Itching, Swelling and Anaphylaxis    Mouth swells and gets itchy    Thank you,   Eddie Candle, PharmD PGY-1 Pharmacy Resident   Please check amion for clinical pharmacist contact number

## 2019-05-28 NOTE — Progress Notes (Signed)
  Subjective:  Patient ID: COLUMBIA BARBERENA, female    DOB: November 02, 1950,  MRN: [id number]  HPI:  A 69 y.o. female presents with status post left foot partial fifth ray amputation with partial closure done by [name] postop day 3.  Patient states that she is doing overall really well.  She denies any other acute complaints.  She is in good spirits.  She states that she is feeling much better since yesterday.  She denies any foot pain or complaints.  The dressings were clean dry and intact and were removed by me today the wound were evaluated.  Objective:   Vitals:   05/28/19 0655 05/28/19 0754  BP: (!) 152/56 (!) 137/59  Pulse:  84  Resp: 19 14  Temp: 98.6 F (37 C) 98.4 F (36.9 C)  SpO2:  100%   General AA&O x3. Normal mood and affect.  Vascular Dorsalis pedis and posterior tibial pulses 2/4 bilat. Brisk capillary refill to all digits. Pedal hair present.  Neurologic Epicritic sensation grossly intact.  Dermatologic Sutures are intact on the proximal and distal aspect with a granular wound present with some fibrotic tissue. There is no purulence.  There is improved edema.  There is no ascending erythema or ascending cellulitis.  No fluctuation or crepitation.  No malodor.  No open lesions or pre-ulcerative lesions.  No pain with calf compression, swelling, warmth, erythema  Orthopedic: MMT 5/5 in dorsiflexion, plantarflexion, inversion, and eversion. Normal joint ROM without pain or crepitus.    Assessment & Plan:  Patient was evaluated and treated and all questions answered.  Postop day 3 status post left partial fifth ray amputation -Continue  IV antibiotics for now. -Culture of the clean margin showed that there is rare staph aureus species growing still pending sensitivities. -Given clinically how the wound is looking, me and [name] agree that patient will benefit from long-term IV antibiotics for 6 to 8 weeks.   -I believe patient will benefit from infectious disease  consult and management of IV antibiotics for long-term care. -Partial weightbearing to the heel of the left foot. -Wound was evaluated no clinical signs of infection noted.  Wet-to-dry dressing was applied. -Nursing dressing instructions were given for wet-to-dry dressing changes tomorrow  Felipa Furnace, DPM  Accessible via secure chat for questions or concerns.

## 2019-05-28 NOTE — Progress Notes (Signed)
PROGRESS NOTE  Tammy Good  DOB: 1951/02/05  PCP: [name], MD [id number]  DOA: 05/23/2019 Admitted From: Home  LOS: 5 days   Chief Complaint  Patient presents with  . Wound Infection   Brief narrative: Tammy Good is an 69 y.o. 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 Patient was referred to the ER by patient's podiatrist after x-rays done last week showed features concerning for osteomyelitis of the left foot.   Patient states she developed a small blister about a month ago and was given 2 course of antibiotics antibiotics. About 1 week prior to admission, the blisters opened and an ulcer was found. She went to her podiatrist on the day before admission and she was referred to the emergency room for further evaluation. MRI of the left foot showed septic arthritis at the fifth MTP joint and advanced osteomyelitis involving the fifth metatarsal and fifth proximal phalanx, cellulitis and myofascitis of the left foot. Patient was admitted under hospitalist medicine service for further evaluation and management.  Subjective: Patient was seen and examined this morning.  Propped up in bed.  Not in distress.  Nausea vomiting improved.  Assessment/Plan: Septic arthritis at the left MTP joint, advanced osteomyelitis involving fifth metatarsal and fifth proximal phalanx, cellulitis and myofascitis of the left foot -3/3-patient underwent fifth ray amputation by podiatrist [name]. -Patient is currently on IV Rocephin, IV Flagyl and IV vancomycin.  Culture has not shown growth so far. -Podiatry follow-up appreciated.  Patient will likely need to go to surgery for excision of further bone versus long-term antibiotics. -For now, patient has a postop shoe, weightbearing allowed as tolerated.  Podiatry to continue to follow-up.  Peripheral vascular disease: Left ABI 0.64 with TBI of 0.35.   -3/4,  patient underwent aortogram, bilateral lower extremity arteriogram with runoff, left SFA revascularization with atherectomy, left FSA angioplasty and stenting -Continue aspirin, Plavix and statin  Intractable nausea and vomiting -Yesterday morning patient had 5-6 episodes of vomiting.  Very nauseated.  Does not want to eat. -?  Cause.  No history of diabetic gastroparesis. -Patient was started on IV hydration.  She was switched to clear liquid diet. -Nausea vomiting has improved. -Advance diet to soft today.  Reduce fluid rate to 50 mils per hour.    CAD with previous coronary stent:  -Continue aspirin, Plavix and Lipitor  Hypertension -Home meds include metoprolol, lisinopril, HCTZ.   -Continue metoprolol. HCTZ and lisinopril remain on hold. -Continue to monitor blood pressure, creatinine, oral intake.  Insulin-dependent type 2 diabetes mellitus:  -Home meds include Farxiga, Lantus, NovoLog. -Continue all.  Continue sliding scale insulin with Accu-Cheks.  Chronic anemia -Continue ferrous sulfate, Protonix,  AKI  -Creatinine normal less than one at baseline. -On admission, creatinine was elevated to 1.56 -Gradually improving with IV fluid.  Repeat BMP tomorrow.  History of panhypopituitarism:  -Continue dexamethasone and Synthroid. -If unable to tolerate oral medicine, will switch to IV.  DVT prophylaxis:  Heparin subcu Antimicrobials:  IV ceftriaxone, IV vancomycin and IV Flagyl. Fluid: Normal saline at 50 mL/h Diet: Soft diet.  Advance as tolerated.  Code Status:  Full code Mobility: Encourage ambulation Family Communication:  Discharge plan:  Anticipated date: Unclear discharge date yet.  May need another surgery. Disposition: Home likely Barriers: May need another surgery  Consultants:  Podiatry   vascular surgery  Antimicrobials: Anti-infectives (From admission, onward)   Start     Dose/Rate Route Frequency  Ordered Stop   05/27/19 1200  vancomycin  (VANCOREADY) IVPB 1750 mg/350 mL     1,750 mg 175 mL/hr over 120 Minutes Intravenous Every 24 hours 05/27/19 1006     05/25/19 2200  vancomycin (VANCOREADY) IVPB 1750 mg/350 mL  Status:  Discontinued     1,750 mg 175 mL/hr over 120 Minutes Intravenous Every 48 hours 05/24/19 0344 05/24/19 0925   05/25/19 2200  vancomycin (VANCOREADY) IVPB 1750 mg/350 mL  Status:  Discontinued     1,750 mg 175 mL/hr over 120 Minutes Intravenous Every 48 hours 05/24/19 1446 05/27/19 1006   05/24/19 1000  cefTRIAXone (ROCEPHIN) 2 g in sodium chloride 0.9 % 100 mL IVPB     2 g 200 mL/hr over 30 Minutes Intravenous Every 24 hours 05/24/19 0925     05/24/19 1000  metroNIDAZOLE (FLAGYL) IVPB 500 mg     500 mg 100 mL/hr over 60 Minutes Intravenous Every 8 hours 05/24/19 0925     05/24/19 0600  piperacillin-tazobactam (ZOSYN) IVPB 3.375 g  Status:  Discontinued     3.375 g 12.5 mL/hr over 240 Minutes Intravenous Every 8 hours 05/24/19 0320 05/24/19 0925   05/23/19 2200  vancomycin (VANCOREADY) IVPB 2000 mg/400 mL     2,000 mg 200 mL/hr over 120 Minutes Intravenous  Once 05/23/19 2158 05/24/19 0254   05/23/19 2200  piperacillin-tazobactam (ZOSYN) IVPB 3.375 g     3.375 g 100 mL/hr over 30 Minutes Intravenous  Once 05/23/19 2158 05/24/19 0020        Code Status: Full Code   Diet Order            DIET SOFT Room service appropriate? Yes; Fluid consistency: Thin  Diet effective now              Infusions:  . sodium chloride 100 mL/hr at 05/27/19 1237  . sodium chloride    . cefTRIAXone (ROCEPHIN)  IV 2 g (05/27/19 1044)   And  . metronidazole 500 mg (05/28/19 0231)  . vancomycin 1,750 mg (05/27/19 1507)    Scheduled Meds: . aspirin EC  81 mg Oral Daily  . atorvastatin  40 mg Oral Daily  . Chlorhexidine Gluconate Cloth  6 each Topical Q0600  . clopidogrel  75 mg Oral Q breakfast  . dexamethasone  0.5 mg Oral Daily  . ferrous sulfate  325 mg Oral Q breakfast  . heparin  5,000 Units Subcutaneous  Q8H  . insulin aspart  0-9 Units Subcutaneous TID WC  . insulin glargine  20 Units Subcutaneous QHS  . lactobacillus acidophilus  2 tablet Oral BID  . levothyroxine  175 mcg Oral QAC breakfast  . mupirocin ointment  1 application Nasal BID  . omega-3 acid ethyl esters  1 g Oral Daily  . pantoprazole  40 mg Oral BID AC  . sodium chloride flush  3 mL Intravenous Once  . sodium chloride flush  3 mL Intravenous Q12H    PRN meds: sodium chloride, acetaminophen, hydrALAZINE, hydrALAZINE, labetalol, ondansetron (ZOFRAN) IV, ondansetron **OR** [DISCONTINUED] ondansetron (ZOFRAN) IV, sodium chloride flush   Objective: Vitals:   05/28/19 0655 05/28/19 0754  BP: (!) 152/56 (!) 137/59  Pulse:  84  Resp: 19 14  Temp: 98.6 F (37 C) 98.4 F (36.9 C)  SpO2:  100%    Intake/Output Summary (Last 24 hours) at 05/28/2019 0845 Last data filed at 05/28/2019 0701 Gross per 24 hour  Intake 1249.85 ml  Output 1252 ml  Net -2.15 ml  Filed Weights   05/24/19 0110 05/25/19 1137  Weight: 101.7 kg 101.7 kg   Weight change:  Body mass index is 35.12 kg/m.   Physical Exam: General exam: Cheerful.  Not in physical distress Skin: No rashes, lesions or ulcers. HEENT: Atraumatic, normocephalic, supple neck, no obvious bleeding Lungs: Clear to auscultation bilaterally CVS: Regular rate and rhythm, no murmur GI/Abd soft, mild epigastric tenderness, nondistended, bowel sound. CNS: Alert, awake, oriented x3 Psychiatry: Mood appropriate Extremities: No pedal edema, no calf tenderness, left foot has a bandage on.   Data Review: I have personally reviewed the laboratory data and studies available.  Recent Labs  Lab 05/23/19 1757 05/24/19 0156 05/25/19 0301 05/26/19 0342 05/27/19 0930  WBC 9.4 9.2 8.1 7.3 6.8  NEUTROABS 7.1  --   --   --  4.0  HGB 11.2* 9.8* 9.3* 9.6* 9.5*  HCT 36.4 31.3* 30.2* 31.4* 31.5*  MCV 94.1 91.3 92.6 94.3 93.8  PLT 620* 518* 507* 518* 450*   Recent Labs  Lab  05/23/19 1552 05/24/19 0156 05/25/19 0301 05/26/19 0342 05/27/19 0930  NA 137 135 140 141 139  K 4.1 4.1 4.0 4.1 3.6  CL 100 102 109 108 108  CO2 24 21* 23 24 22   GLUCOSE 237* 160* 150* 122* 128*  BUN 23 26* 16 10 <5*  CREATININE 1.65* 1.82* 1.34* 0.93 0.75  CALCIUM 8.4* 7.9* 8.0* 8.5* 8.3*  MG  --   --   --   --  1.6*    Signed, Terrilee Croak, MD [facility] Pager: [phone] (Secure Chat preferred). 05/28/2019

## 2019-05-29 ENCOUNTER — Inpatient Hospital Stay: Payer: Self-pay

## 2019-05-29 DIAGNOSIS — M00072 Staphylococcal arthritis, left ankle and foot: Principal | ICD-10-CM

## 2019-05-29 DIAGNOSIS — E11621 Type 2 diabetes mellitus with foot ulcer: Secondary | ICD-10-CM

## 2019-05-29 DIAGNOSIS — Z95820 Peripheral vascular angioplasty status with implants and grafts: Secondary | ICD-10-CM

## 2019-05-29 DIAGNOSIS — E1169 Type 2 diabetes mellitus with other specified complication: Secondary | ICD-10-CM

## 2019-05-29 DIAGNOSIS — B9562 Methicillin resistant Staphylococcus aureus infection as the cause of diseases classified elsewhere: Secondary | ICD-10-CM

## 2019-05-29 DIAGNOSIS — N179 Acute kidney failure, unspecified: Secondary | ICD-10-CM

## 2019-05-29 DIAGNOSIS — L97529 Non-pressure chronic ulcer of other part of left foot with unspecified severity: Secondary | ICD-10-CM

## 2019-05-29 DIAGNOSIS — I2510 Atherosclerotic heart disease of native coronary artery without angina pectoris: Secondary | ICD-10-CM

## 2019-05-29 DIAGNOSIS — E1151 Type 2 diabetes mellitus with diabetic peripheral angiopathy without gangrene: Secondary | ICD-10-CM

## 2019-05-29 DIAGNOSIS — M869 Osteomyelitis, unspecified: Secondary | ICD-10-CM

## 2019-05-29 DIAGNOSIS — Z89422 Acquired absence of other left toe(s): Secondary | ICD-10-CM

## 2019-05-29 DIAGNOSIS — Z91018 Allergy to other foods: Secondary | ICD-10-CM

## 2019-05-29 LAB — GLUCOSE, CAPILLARY
Glucose-Capillary: 114 mg/dL — ABNORMAL HIGH (ref 70–99)
Glucose-Capillary: 133 mg/dL — ABNORMAL HIGH (ref 70–99)
Glucose-Capillary: 144 mg/dL — ABNORMAL HIGH (ref 70–99)
Glucose-Capillary: 157 mg/dL — ABNORMAL HIGH (ref 70–99)

## 2019-05-29 MED ORDER — SODIUM CHLORIDE 0.9% FLUSH
10.0000 mL | INTRAVENOUS | Status: DC | PRN
  Administered 2019-05-30: 10 mL

## 2019-05-29 MED ORDER — SODIUM CHLORIDE 0.9% FLUSH: 10.0000 mL | Freq: Two times a day (BID) | INTRAVENOUS | Status: DC

## 2019-05-29 NOTE — Progress Notes (Signed)
Peripherally Inserted Central Catheter/Midline Placement  The IV Nurse has discussed with the patient and/or persons authorized to consent for the patient, the purpose of this procedure and the potential benefits and risks involved with this procedure.  The benefits include less needle sticks, lab draws from the catheter, and the patient may be discharged home with the catheter. Risks include, but not limited to, infection, bleeding, blood clot (thrombus formation), and puncture of an artery; nerve damage and irregular heartbeat and possibility to perform a PICC exchange if needed/ordered by physician.  Alternatives to this procedure were also discussed.  Bard Power PICC patient education guide, fact sheet on infection prevention and patient information card has been provided to patient /or left at bedside.    PICC/Midline Placement Documentation  PICC Single Lumen 05/29/19 PICC Right Cephalic 40 cm 0 cm (Active)  Indication for Insertion or Continuance of Line Home intravenous therapies (PICC only) 05/29/19 1800  Exposed Catheter (cm) 0 cm 05/29/19 1800  Site Assessment Clean;Dry;Intact 05/29/19 1800  Line Status Flushed;Saline locked;Blood return noted 05/29/19 1800  Dressing Type Transparent 05/29/19 1800  Dressing Status Clean;Dry;Intact;Antimicrobial disc in place 05/29/19 1800  Agency checked and tightened 05/29/19 1800  Line Adjustment (NICU/IV Team Only) No 05/29/19 1800  Dressing Intervention New dressing 05/29/19 1800  Dressing Change Due 06/05/19 05/29/19 1800       Rolena Infante 05/29/2019, 6:52 PM

## 2019-05-29 NOTE — Progress Notes (Signed)
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  She had failed oral antibiotics as an outpatient.  MRI of the left foot showed septic arthritis of the fifth metatarsophalangeal joint and advanced osteomyelitis involving the fifth metatarsal and fifth proximal phalanx, cellulitis, myositis of the left foot.  He underwent fifth ray amputation by [name].  Podiatry recommending 6 to 8 weeks of IV antibiotics.  Wound culture MRSA.  ID consulted today.  Assessment & Plan:   Principal Problem:   Osteomyelitis of left foot (HCC) Active Problems:   CAD S/P percutaneous coronary angioplasty   Diabetes mellitus type 2 in obese [place])   Essential hypertension   Osteomyelitis (HCC)  Septic arthritis of the left MTP joint: Imaging showed osteomyelitis involving fifth metatarsal and fifth proximal phalanx.  Underwent fifth ray amputation by podiatry.  Currently on vancomycin.  Culture of clean margin showed MRSA.  Podiatry recommending 6 to 8 weeks of IV antibiotics.  We have requested for ID consultation.  Continue partial weightbearing to the heel of the left foot. Plan for wet-to-dry dressing changes tomorrow. Physical therapy recommended rolling walker on discharge.  History of peripheral vascular disease: Left ABI of 0.64 with TBI of 0.35 . On  05/26/19, patient underwent aortogram, bilateral lower extremity arteriogram with runoff, left SFA revascularization with atherectomy left FSA angioplasty and stenting by vascular surgery.  Continue aspirin, Plavix, statin  Intractable nausea/vomiting: Resolved  Coronary artery  disease with history of coronary stent:  Continue aspirin, Plavix, statin Lipitor  Hypertension: Currently blood pressure stable.  Continue metoprolol.  Also takes lisinopril and hydrochlorothiazide at home  Insulin-dependent diabetes type 2: On Farxiga, Lantus, NovoLog, Lantus at home.  Chronic normocytic anemia: Currently hemoglobin stable.  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  Obesity: BMI of 35         DVT prophylaxis:heparin [place] Code Status: Full Family Communication: None present at the bedside Disposition Plan: Patient is from home.  Awaiting consult from infectious disease.  Expected discharge plan to home when cleared by podiatry and with recommendation from ID.   Consultants: Podiatry, ID  Procedures: As above  Antimicrobials:  Anti-infectives (From admission, onward)   Start     Dose/Rate Route Frequency Ordered Stop   05/28/19 1500  vancomycin (VANCOREADY) IVPB 1500 mg/300 mL     1,500 mg 150 mL/hr over 120 Minutes Intravenous Every 24 hours 05/28/19 1019     05/27/19 1200  vancomycin (VANCOREADY) IVPB 1750 mg/350 mL  Status:  Discontinued     1,750 mg 175 mL/hr over 120 Minutes Intravenous Every 24 hours 05/27/19 1006 05/28/19 1019   05/25/19 2200  vancomycin (VANCOREADY) IVPB 1750 mg/350 mL  Status:  Discontinued     1,750 mg 175 mL/hr over 120 Minutes Intravenous Every 48 hours 05/24/19 0344 05/24/19 0925   05/25/19 2200  vancomycin (VANCOREADY) IVPB 1750 mg/350 mL  Status:  Discontinued     1,750 mg 175 mL/hr over 120 Minutes Intravenous Every 48 hours 05/24/19 1446 05/27/19 1006   05/24/19 1000  cefTRIAXone (ROCEPHIN) 2 g in sodium chloride 0.9 % 100 mL IVPB  Status:  Discontinued  2 g 200 mL/hr over 30 Minutes Intravenous Every 24 hours 05/24/19 0925 05/29/19 1222   05/24/19 1000  metroNIDAZOLE (FLAGYL) IVPB 500 mg  Status:  Discontinued     500 mg 100 mL/hr over 60 Minutes Intravenous Every 8 hours 05/24/19  0925 05/29/19 1222   05/24/19 0600  piperacillin-tazobactam (ZOSYN) IVPB 3.375 g  Status:  Discontinued     3.375 g 12.5 mL/hr over 240 Minutes Intravenous Every 8 hours 05/24/19 0320 05/24/19 0925   05/23/19 2200  vancomycin (VANCOREADY) IVPB 2000 mg/400 mL     2,000 mg 200 mL/hr over 120 Minutes Intravenous  Once 05/23/19 2158 05/24/19 0254   05/23/19 2200  piperacillin-tazobactam (ZOSYN) IVPB 3.375 g     3.375 g 100 mL/hr over 30 Minutes Intravenous  Once 05/23/19 2158 05/24/19 0020      Subjective: Patient seen and examined at the bedside this afternoon.  Hemodynamically stable.  Comfortable.  Denies any pain.  Eager to go home.  She states she is going to live with her sister for few weeks once he goes home.  Objective: Vitals:   05/28/19 1810 05/28/19 1953 05/29/19 0630 05/29/19 0808  BP: (!) 133/47 (!) 126/41 (!) 138/51 (!) 145/55  Pulse: 69 82 77 78  Resp: 18 (!) 21 15 18   Temp: 98 F (36.7 C) 98 F (36.7 C) 98.2 F (36.8 C) 98.1 F (36.7 C)  TempSrc: Oral Oral Oral Oral  SpO2: 100% 100% 98% 100%  Weight:      Height:        Intake/Output Summary (Last 24 hours) at 05/29/2019 1335 Last data filed at 05/29/2019 0443 Gross per 24 hour  Intake 1627.04 ml  Output --  Net 1627.04 ml   Filed Weights   05/24/19 0110 05/25/19 1137  Weight: 101.7 kg 101.7 kg    Examination:  General exam: Appears calm and comfortable ,Not in distress,obese Respiratory system: Bilateral equal air entry, normal vesicular breath sounds, no wheezes or crackles  Cardiovascular system: S1 & S2 heard, RRR. No JVD, murmurs, rubs, gallops or clicks. No pedal edema. Gastrointestinal system: Abdomen is nondistended, soft and nontender. No organomegaly or masses felt. Normal bowel sounds heard. Central nervous system: Alert and oriented. No focal neurological deficits. Extremities: No edema, no clubbing ,no cyanosis, dressings on the left foot  skin: No rashes, lesions or ulcers,no icterus ,no  pallor   Data Reviewed: I have personally reviewed following labs and imaging studies  CBC: Recent Labs  Lab 05/23/19 1757 05/24/19 0156 05/25/19 0301 05/26/19 0342 05/27/19 0930  WBC 9.4 9.2 8.1 7.3 6.8  NEUTROABS 7.1  --   --   --  4.0  HGB 11.2* 9.8* 9.3* 9.6* 9.5*  HCT 36.4 31.3* 30.2* 31.4* 31.5*  MCV 94.1 91.3 92.6 94.3 93.8  PLT 620* 518* 507* 518* [id number]*   Basic Metabolic Panel: Recent Labs  Lab 05/23/19 1552 05/24/19 0156 05/25/19 0301 05/26/19 0342 05/27/19 0930  NA 137 135 140 141 139  K 4.1 4.1 4.0 4.1 3.6  CL 100 102 109 108 108  CO2 24 21* 23 24 22   GLUCOSE 237* 160* 150* 122* 128*  BUN 23 26* 16 10 <5*  CREATININE 1.65* 1.82* 1.34* 0.93 0.75  CALCIUM 8.4* 7.9* 8.0* 8.5* 8.3*  MG  --   --   --   --  1.6*   GFR: Estimated Creatinine Clearance: 84.7 mL/min (by C-G formula based on SCr of 0.75 mg/dL). Liver Function Tests: Recent Labs  Lab 05/23/19  1552  AST 16  ALT 16  ALKPHOS 85  BILITOT 0.6  PROT 7.2  ALBUMIN 2.8*   No results for input(s): LIPASE, AMYLASE in the last 168 hours. No results for input(s): AMMONIA in the last 168 hours. Coagulation Profile: Recent Labs  Lab 05/26/19 0342  INR 1.1   Cardiac Enzymes: No results for input(s): CKTOTAL, CKMB, CKMBINDEX, TROPONINI in the last 168 hours. BNP (last 3 results) No results for input(s): PROBNP in the last 8760 hours. HbA1C: No results for input(s): HGBA1C in the last 72 hours. CBG: Recent Labs  Lab 05/28/19 1142 05/28/19 1641 05/28/19 2122 05/29/19 0628 05/29/19 1151  GLUCAP 129* 207* 174* 114* 133*   Lipid Profile: No results for input(s): CHOL, HDL, LDLCALC, TRIG, CHOLHDL, LDLDIRECT in the last 72 hours. Thyroid Function Tests: No results for input(s): TSH, T4TOTAL, FREET4, T3FREE, THYROIDAB in the last 72 hours. Anemia Panel: No results for input(s): VITAMINB12, FOLATE, FERRITIN, TIBC, IRON, RETICCTPCT in the last 72 hours. Sepsis Labs: Recent Labs  Lab 05/23/19 1552  05/23/19 1755 05/24/19 0156 05/24/19 0741  PROCALCITON  --   --   --  <0.10  LATICACIDVEN 3.6* 2.9* 2.4* 1.4    Recent Results (from the past 240 hour(s))  Blood culture (routine x 2)     Status: None   Collection Time: 05/23/19 10:17 PM   Specimen: BLOOD  Result Value Ref Range Status   Specimen Description BLOOD LEFT ANTECUBITAL  Final   Special Requests   Final    BOTTLES DRAWN AEROBIC AND ANAEROBIC Blood Culture results may not be optimal due to an inadequate volume of blood received in culture bottles   Culture   Final    NO GROWTH 5 DAYS Performed at [facility], [place] [address], [place], [place] [address]    Report Status 05/28/2019 FINAL  Final  Blood culture (routine x 2)     Status: None   Collection Time: 05/23/19 10:33 PM   Specimen: BLOOD RIGHT HAND  Result Value Ref Range Status   Specimen Description BLOOD RIGHT HAND  Final   Special Requests   Final    BOTTLES DRAWN AEROBIC AND ANAEROBIC Blood Culture results may not be optimal due to an inadequate volume of blood received in culture bottles   Culture   Final    NO GROWTH 5 DAYS Performed at [facility], [place] [address], [place], [place] [address]    Report Status 05/28/2019 FINAL  Final  SARS CORONAVIRUS 2 (TAT 6-24 HRS) Nasopharyngeal Nasopharyngeal Swab     Status: None   Collection Time: 05/24/19 12:25 AM   Specimen: Nasopharyngeal Swab  Result Value Ref Range Status   SARS Coronavirus 2 NEGATIVE NEGATIVE Final    Comment: (NOTE) SARS-CoV-2 target nucleic acids are NOT DETECTED. The SARS-CoV-2 RNA is generally detectable in upper and lower respiratory specimens during the acute phase of infection. Negative results do not preclude SARS-CoV-2 infection, do not rule out co-infections with other pathogens, and should not be used as the sole basis for treatment or other patient management decisions. Negative results must be combined with clinical observations, patient history, and  epidemiological information. The expected result is Negative. Fact Sheet for Patients: SugarRoll.be Fact Sheet for Healthcare Providers: https://www.woods-mathews.com/ This test is not yet approved or cleared by the [place] FDA and  has been authorized for detection and/or diagnosis of SARS-CoV-2 by FDA under an Emergency Use Authorization (EUA). This EUA will remain  in effect (meaning  this test can be used) for the duration of the COVID-19 declaration under Section 56 4(b)(1) of the Act, 21 U.S.C. section 360bbb-3(b)(1), unless the authorization is terminated or revoked sooner. Performed at [facility], [place] [address], [place], [place] [address]   Surgical pcr screen     Status: Abnormal   Collection Time: 05/24/19  1:25 PM   Specimen: Nasal Mucosa; Nasal Swab  Result Value Ref Range Status   MRSA, PCR POSITIVE (A) NEGATIVE Final    Comment: RESULT CALLED TO, READ BACK BY AND VERIFIED WITH: Eulogio Ditch RN 15:45 05/24/19 ([email or web address])    Staphylococcus aureus POSITIVE (A) NEGATIVE Final    Comment: (NOTE) The Xpert SA Assay (FDA approved for NASAL specimens in patients 57 years of age and older), is one component of a comprehensive surveillance program. It is not intended to diagnose infection nor to guide or monitor treatment. Performed at [facility], [place] [address], [place], [place] [address]   Aerobic/Anaerobic Culture (surgical/deep wound)     Status: None (Preliminary result)   Collection Time: 05/25/19 12:49 PM   Specimen: Wound  Result Value Ref Range Status   Specimen Description WOUND LEFT FOOT  Final   Special Requests PATIENT ON FOLLOWING FLAGYL, ROCEPHIN  Final   Gram Stain NO WBC SEEN NO ORGANISMS SEEN   Final   Culture   Final    NO GROWTH 4 DAYS NO ANAEROBES ISOLATED; CULTURE IN PROGRESS FOR 5 DAYS Performed at [facility], [address], [place], [place] [address]    Report Status PENDING   Incomplete  Acid Fast Smear (AFB)     Status: None   Collection Time: 05/25/19 12:49 PM   Specimen: Wound  Result Value Ref Range Status   AFB Specimen Processing Concentration  Final   Acid Fast Smear Negative  Final    Comment: (NOTE) Performed At: [facility] [place], [place] [id number] Rush Farmer MD [id number]    Source (AFB) WOUND  Final    Comment: LEFT FOOT Performed at [facility], [place] [address], [place], [place] [address]   Aerobic/Anaerobic Culture (surgical/deep wound)     Status: None (Preliminary result)   Collection Time: 05/25/19 12:54 PM   Specimen: Wound  Result Value Ref Range Status   Specimen Description TISSUE LEFT FIFTH METATARSAL  Final   Special Requests PATIENT ON FOLLOWING FLAGYL, ROCEPHIN  Final   Gram Stain NO WBC SEEN NO ORGANISMS SEEN   Final   Culture   Final    RARE METHICILLIN RESISTANT STAPHYLOCOCCUS AUREUS NO ANAEROBES ISOLATED; CULTURE IN PROGRESS FOR 5 DAYS RESULT CALLED TO, READ BACK BY AND VERIFIED WITH: RN Dione Plover [id number] Performed at [facility], [address], [place], [place] [address]    Report Status PENDING  Incomplete   Organism ID, Bacteria METHICILLIN RESISTANT STAPHYLOCOCCUS AUREUS  Final      Susceptibility   Methicillin resistant staphylococcus aureus - MIC*    CIPROFLOXACIN <=0.5 SENSITIVE Sensitive     ERYTHROMYCIN 4 INTERMEDIATE Intermediate     GENTAMICIN <=0.5 SENSITIVE Sensitive     OXACILLIN >=4 RESISTANT Resistant     TETRACYCLINE <=1 SENSITIVE Sensitive     VANCOMYCIN <=0.5 SENSITIVE Sensitive     TRIMETH/SULFA <=10 SENSITIVE Sensitive     CLINDAMYCIN <=0.25 SENSITIVE Sensitive     RIFAMPIN <=0.5 SENSITIVE Sensitive     Inducible Clindamycin NEGATIVE Sensitive     *  RARE METHICILLIN RESISTANT STAPHYLOCOCCUS AUREUS  Acid Fast Smear (AFB)     Status: None   Collection Time: 05/25/19 12:54 PM   Specimen: Wound  Result Value Ref Range Status   AFB Specimen  Processing Comment  Final    Comment: Tissue Grinding and Digestion/Decontamination   Acid Fast Smear Negative  Final    Comment: (NOTE) Performed At: [facility] [place], [place] [id number] Rush Farmer MD [id number]    Source (AFB) TISSUE  Final    Comment: LEFT FIFTH METATARSAL Performed at [facility], [place] [address], [place], [place] [address]          Radiology Studies: No results found.      Scheduled Meds: . aspirin EC  81 mg Oral Daily  . atorvastatin  40 mg Oral Daily  . Chlorhexidine Gluconate Cloth  6 each Topical Q0600  . clopidogrel  75 mg Oral Q breakfast  . dexamethasone  0.5 mg Oral Daily  . ferrous sulfate  325 mg Oral Q breakfast  . heparin  5,000 Units Subcutaneous Q8H  . insulin aspart  0-9 Units Subcutaneous TID WC  . insulin glargine  20 Units Subcutaneous QHS  . lactobacillus acidophilus  2 tablet Oral BID  . levothyroxine  175 mcg Oral QAC breakfast  . mupirocin ointment  1 application Nasal BID  . omega-3 acid ethyl esters  1 g Oral Daily  . pantoprazole  40 mg Oral BID AC  . sodium chloride flush  3 mL Intravenous Once  . sodium chloride flush  3 mL Intravenous Q12H   Continuous Infusions: . sodium chloride 50 mL/hr at 05/29/19 0129  . sodium chloride    . vancomycin 1,500 mg (05/28/19 1428)     LOS: 6 days    Time spent: 25 mins.More than 50% of that time was spent in counseling and/or coordination of care.      Shelly Coss, MD [facility] P3/09/2019, 1:35 PM

## 2019-05-29 NOTE — Consult Note (Signed)
[place] for Infectious Disease  Total days of antibiotics 7/vanco-ctx-metro               Reason for Consult: MRSA dfu osteo    Referring Physician: Tawanna Solo  Principal Problem:   Osteomyelitis of left foot ([place]) Active Problems:   CAD S/P percutaneous coronary angioplasty   Diabetes mellitus type 2 in obese [facility])   Essential hypertension   Osteomyelitis (HCC)    HPI: Tammy Good is a 69 y.o. female with T2DM, CAD with PAD, with ongoing left DM2 with chronic ulcer s/p 5th ray amputation on 3/3 as well as left SFA angioplasty, and proximal sten placement on 3/4 by vascular surgery. She was started empirically on vancomycin, ceftriaxone, plus metronidazole. Still has residual infection since culture from clean margin with MRSA. He is afebrile.   Past Medical History:  Diagnosis Date  . CAD S/P percutaneous coronary angioplasty 08/2013   100% mRCA - PCI Integrity Resolute DES 3.0 mm x 38 mm - 3.35 mm; PTCA of RPA V 2.0 mm x 15 mm  . Cholesteatoma    right  . Diabetes mellitus type 2 in obese (HCC)    On insulin and Invokana  . Hyperlipidemia with target LDL less than 70   . Hypothyroidism (acquired)   . Mild essential hypertension   . Obesity (BMI 30-39.9) 11/17/2013  . Panhypopituitarism ([place])   . ST elevation myocardial infarction (STEMI) of inferior wall, subsequent episode of care ([place]) 08/2013   80% branch of D1, 40% mid AV groove circumflex, 100% RCA with subacute thrombus -- thrombus extending into RPA V with 100% occlusion after initial angioplasty of mid RCA ;; Post MI ECHO 6/9/'15: EF 50-55%, mild LVH with moderate HK of inferior wall, Gr1 DD, mild LA dilation; mildly reduced RV function  . Status post insertion of drug eluting coronary artery stent to RCA emergently 09/16/2013    Allergies:  Allergies  Allergen Reactions  . Strawberry Extract Itching, Swelling and Anaphylaxis    Mouth swells and gets itchy    MEDICATIONS: . aspirin EC  81 mg Oral  Daily  . atorvastatin  40 mg Oral Daily  . Chlorhexidine Gluconate Cloth  6 each Topical Q0600  . clopidogrel  75 mg Oral Q breakfast  . dexamethasone  0.5 mg Oral Daily  . ferrous sulfate  325 mg Oral Q breakfast  . heparin  5,000 Units Subcutaneous Q8H  . insulin aspart  0-9 Units Subcutaneous TID WC  . insulin glargine  20 Units Subcutaneous QHS  . lactobacillus acidophilus  2 tablet Oral BID  . levothyroxine  175 mcg Oral QAC breakfast  . mupirocin ointment  1 application Nasal BID  . omega-3 acid ethyl esters  1 g Oral Daily  . pantoprazole  40 mg Oral BID AC  . sodium chloride flush  3 mL Intravenous Once  . sodium chloride flush  3 mL Intravenous Q12H    Social History   Tobacco Use  . Smoking status: Former Research scientist (life sciences)  . Smokeless tobacco: Never Used  Substance Use Topics  . Alcohol use: No  . Drug use: No    Family History  Problem Relation Age of Onset  . Cancer Mother 54       multiple myeloma  . Heart attack Father 26  . Cancer Sister   . Alzheimer's disease Maternal Grandmother    Review of Systems  Constitutional: Negative for fever, chills, diaphoresis, activity change, appetite change, fatigue and unexpected weight  change.  HENT: Negative for congestion, sore throat, rhinorrhea, sneezing, trouble swallowing and sinus pressure.  Eyes: Negative for photophobia and visual disturbance.  Respiratory: Negative for cough, chest tightness, shortness of breath, wheezing and stridor.  Cardiovascular: Negative for chest pain, palpitations and leg swelling.  Gastrointestinal: Negative for nausea, vomiting, abdominal pain, diarrhea, constipation, blood in stool, abdominal distention and anal bleeding.  Genitourinary: Negative for dysuria, hematuria, flank pain and difficulty urinating.  Skin = +left foot wound  Neurological: Negative for dizziness, tremors, weakness and light-headedness.  Hematological: Negative for adenopathy. Does not bruise/bleed easily.    Psychiatric/Behavioral: Negative for behavioral problems, confusion, sleep disturbance, dysphoric mood, decreased concentration and agitation.    OBJECTIVE: Temp:  [98 F (36.7 C)-98.2 F (36.8 C)] 98.1 F (36.7 C) (03/07 0808) Pulse Rate:  [69-82] 78 (03/07 0808) Resp:  [15-21] 18 (03/07 0808) BP: (126-145)/(41-55) 145/55 (03/07 0808) SpO2:  [98 %-100 %] 100 % (03/07 0174) Physical Exam  Constitutional:  oriented to person, place, and time. appears well-developed and well-nourished. No distress.  HENT: [place]/AT, PERRLA, no scleral icterus Mouth/Throat: Oropharynx is clear and moist. No oropharyngeal exudate.  Cardiovascular: Normal rate, regular rhythm and normal heart sounds. Exam reveals no gallop and no friction rub.  No murmur heard.  Pulmonary/Chest: Effort normal and breath sounds normal. No respiratory distress.  has no wheezes.  Neck = supple, no nuchal rigidity Abdominal: Soft. Bowel sounds are normal.  exhibits no distension. There is no tenderness.  Lymphadenopathy: no cervical adenopathy. No axillary adenopathy Neurological: alert and oriented to person, place, and time.  Ext: +left wrapped foot Skin: Skin is warm and dry. No rash noted. No erythema.  Psychiatric: a normal mood and affect.  behavior is normal.   LABS: Results for orders placed or performed during the hospital encounter of 05/23/19 (from the past 48 hour(s))  Glucose, capillary     Status: Abnormal   Collection Time: 05/27/19  5:19 PM  Result Value Ref Range   Glucose-Capillary 129 (H) 70 - 99 mg/dL    Comment: Glucose reference range applies only to samples taken after fasting for at least 8 hours.  Glucose, capillary     Status: Abnormal   Collection Time: 05/27/19  9:05 PM  Result Value Ref Range   Glucose-Capillary 159 (H) 70 - 99 mg/dL    Comment: Glucose reference range applies only to samples taken after fasting for at least 8 hours.  Glucose, capillary     Status: None   Collection Time:  05/28/19  6:40 AM  Result Value Ref Range   Glucose-Capillary 95 70 - 99 mg/dL    Comment: Glucose reference range applies only to samples taken after fasting for at least 8 hours.  Glucose, capillary     Status: Abnormal   Collection Time: 05/28/19 11:42 AM  Result Value Ref Range   Glucose-Capillary 129 (H) 70 - 99 mg/dL    Comment: Glucose reference range applies only to samples taken after fasting for at least 8 hours.  Glucose, capillary     Status: Abnormal   Collection Time: 05/28/19  4:41 PM  Result Value Ref Range   Glucose-Capillary 207 (H) 70 - 99 mg/dL    Comment: Glucose reference range applies only to samples taken after fasting for at least 8 hours.  Glucose, capillary     Status: Abnormal   Collection Time: 05/28/19  9:22 PM  Result Value Ref Range   Glucose-Capillary 174 (H) 70 - 99 mg/dL  Comment: Glucose reference range applies only to samples taken after fasting for at least 8 hours.  Glucose, capillary     Status: Abnormal   Collection Time: 05/29/19  6:28 AM  Result Value Ref Range   Glucose-Capillary 114 (H) 70 - 99 mg/dL    Comment: Glucose reference range applies only to samples taken after fasting for at least 8 hours.    MICRO: 3/3 tissue MRSA Organism ID, Bacteria METHICILLIN RESISTANT STAPHYLOCOCCUS AUREUS   Resulting Agency [facility]  Susceptibility   Methicillin resistant staphylococcus aureus    MIC    CIPROFLOXACIN <=0.5 SENSI... Sensitive    CLINDAMYCIN <=0.25 SENS... Sensitive    ERYTHROMYCIN 4 INTERMEDI... Intermediate    GENTAMICIN <=0.5 SENSI... Sensitive    Inducible Clindamycin NEGATIVE  Sensitive    OXACILLIN >=4 RESISTANT  Resistant    RIFAMPIN <=0.5 SENSI... Sensitive    TETRACYCLINE <=1 SENSITIVE  Sensitive    TRIMETH/SULFA <=10 SENSIT... Sensitive    VANCOMYCIN <=0.5 SENSI... Sensitive         Susceptibility Comments    05/23/19: blood cx NGTD  IMAGING:  FINDINGS: Large open wound noted on the lateral and dorsal  aspect of the foot at the level of the mid distal fifth metatarsal. Associated small fluid collection at the base of the wound is likely a small abscess. There may be a small drain in this area also. Surrounding diffuse subcutaneous soft tissue swelling/edema/fluid consistent with cellulitis. There is also moderate myofasciitis involving the lateral aspect of the forefoot. No findings suspicious for pyomyositis.  Septic arthritis at the fifth MTP joint and advanced osteomyelitis involving the fifth metatarsal and fifth proximal phalanx. As demonstrated on the plain films there is destructive bony change.  The other bony structures are intact. I do not see any other definite sites of osteomyelitis or septic arthritis. The major tendons and ligaments appear intact.  IMPRESSION: 1. Large open wound on the lateral and dorsal aspect of the foot at the level of the mid distal fifth metatarsal. 2. Septic arthritis at the fifth MTP joint and advanced osteomyelitis involving the fifth metatarsal and fifth proximal phalanx. 3. Cellulitis and myofasciitis but no findings for pyomyositis.  Assessment/Plan:  69yo F with DFU/osteomyelitis with MRSA osteomyelitis - place picc line  - give 6 weeks IV vancomycin, will change to different agent if she starts to have aki - will get sed rate and crp - will place opat orders - will also get sed rate and crp for baseline   Diagnosis: mrsa osteo  Culture Result: mrsa  Allergies  Allergen Reactions  . Strawberry Extract Itching, Swelling and Anaphylaxis    Mouth swells and gets itchy    OPAT Orders Discharge antibiotics: Per pharmacy protocol Aim for Vancomycin trough 15-20 or AUC 400-550 (unless otherwise indicated) Duration: 6 wk End Date: July 07, 2019  [place] Per Protocol:  Home health RN for IV administration and teaching; PICC line care and labs.    Labs weekly while on IV antibiotics: x__ CBC with differential _x_  BMP _x_ CRP _x_ ESR _x_ Vancomycin trough   _x_ Please pull PIC at completion of IV antibiotics  Fax weekly labs to [phone]  Clinic Follow Up Appt: 4-6 wk  @ [facility] with Dr Baxter Flattery

## 2019-05-30 ENCOUNTER — Telehealth: Payer: Self-pay | Admitting: *Deleted

## 2019-05-30 DIAGNOSIS — M86072 Acute hematogenous osteomyelitis, left ankle and foot: Secondary | ICD-10-CM

## 2019-05-30 LAB — CBC WITH DIFFERENTIAL/PLATELET
Abs Immature Granulocytes: 0.06 10*3/uL (ref 0.00–0.07)
Basophils Absolute: 0.1 10*3/uL (ref 0.0–0.1)
Basophils Relative: 1 %
Eosinophils Absolute: 0.2 10*3/uL (ref 0.0–0.5)
Eosinophils Relative: 2 %
HCT: 32.6 % — ABNORMAL LOW (ref 36.0–46.0)
Hemoglobin: 9.9 g/dL — ABNORMAL LOW (ref 12.0–15.0)
Immature Granulocytes: 1 %
Lymphocytes Relative: 14 %
Lymphs Abs: 1.3 10*3/uL (ref 0.7–4.0)
MCH: 28.9 pg (ref 26.0–34.0)
MCHC: 30.4 g/dL (ref 30.0–36.0)
MCV: 95 fL (ref 80.0–100.0)
Monocytes Absolute: 0.9 10*3/uL (ref 0.1–1.0)
Monocytes Relative: 9 %
Neutro Abs: 7.3 10*3/uL (ref 1.7–7.7)
Neutrophils Relative %: 73 %
Platelets: 471 10*3/uL — ABNORMAL HIGH (ref 150–400)
RBC: 3.43 MIL/uL — ABNORMAL LOW (ref 3.87–5.11)
RDW: 15.9 % — ABNORMAL HIGH (ref 11.5–15.5)
WBC: 9.9 10*3/uL (ref 4.0–10.5)
nRBC: 0 % (ref 0.0–0.2)

## 2019-05-30 LAB — AEROBIC/ANAEROBIC CULTURE W GRAM STAIN (SURGICAL/DEEP WOUND)
Culture: NO GROWTH
Gram Stain: NONE SEEN
Gram Stain: NONE SEEN

## 2019-05-30 LAB — GLUCOSE, CAPILLARY
Glucose-Capillary: 81 mg/dL (ref 70–99)
Glucose-Capillary: 127 mg/dL — ABNORMAL HIGH (ref 70–99)
Glucose-Capillary: 165 mg/dL — ABNORMAL HIGH (ref 70–99)

## 2019-05-30 LAB — BASIC METABOLIC PANEL
Anion gap: 6 (ref 5–15)
BUN: <5 mg/dL — ABNORMAL LOW (ref 8–23)
CO2: 22 mmol/L (ref 22–32)
Calcium: 8 mg/dL — ABNORMAL LOW (ref 8.9–10.3)
Chloride: 111 mmol/L (ref 98–111)
Creatinine, Ser: 0.64 mg/dL (ref 0.44–1.00)
GFR calc Af Amer: >60 mL/min (ref >60)
GFR calc non Af Amer: >60 mL/min (ref >60)
Glucose, Bld: 100 mg/dL — ABNORMAL HIGH (ref 70–99)
Potassium: 3.8 mmol/L (ref 3.5–5.1)
Sodium: 139 mmol/L (ref 135–145)

## 2019-05-30 LAB — MAGNESIUM: Magnesium: 1.6 mg/dL — ABNORMAL LOW (ref 1.7–2.4)

## 2019-05-30 MED ORDER — CHLORHEXIDINE GLUCONATE CLOTH 2 % EX PADS
6.0000 | MEDICATED_PAD | Freq: Every day | CUTANEOUS | Status: DC
  Administered 2019-05-30: 6 via TOPICAL

## 2019-05-30 MED ORDER — VANCOMYCIN IV (FOR PTA / DISCHARGE USE ONLY): 1500.0000 mg | INTRAVENOUS | 0 refills | Status: DC

## 2019-05-30 MED ORDER — CLOPIDOGREL BISULFATE 75 MG PO TABS: 75.0000 mg | ORAL_TABLET | Freq: Every day | ORAL | 1 refills | Status: DC

## 2019-05-30 MED ORDER — HEPARIN SOD (PORK) LOCK FLUSH 100 UNIT/ML IV SOLN
250.0000 [IU] | INTRAVENOUS | Status: AC | PRN
  Administered 2019-05-30: 250 [IU]
  Filled 2019-05-30: qty 2.5

## 2019-05-30 NOTE — Progress Notes (Signed)
PHARMACY CONSULT NOTE FOR:  OUTPATIENT  PARENTERAL ANTIBIOTIC THERAPY (OPAT)  Indication: osteomyelitis Regimen: Vancomycin 1500mg  IV q24h End date: 07/07/19  IV antibiotic discharge orders are pended. To discharging provider:  please sign these orders via discharge navigator,  Select New Orders & click on the button choice - Manage This Unsigned Work.     Thank you for allowing pharmacy to be a part of this patient's care.  Hildred Laser, PharmD Clinical Pharmacist **Pharmacist phone directory can now be found on [email or web address] ([email or web address]).  Listed under [place]

## 2019-05-30 NOTE — TOC Transition Note (Addendum)
Transition of Care [facility]) - CM/SW Discharge Note Marvetta Gibbons RN, BSN Transitions of Care Unit 4E- RN Case Manager [phone]   Patient Details  Name: Tammy Good MRN: [id number] Date of Birth: 1950-12-05  Transition of Care [facility]) CM/SW Contact:  Dawayne Patricia, RN Phone Number: 05/30/2019, 1:15 PM   Clinical Narrative:    Pt stable for transition home today, ID consulted over the weekend and pt will need 6wks of IV abx (Vanc) at home- end date April 15. [place] referral was made to [facility] per previous CM- call made to [facility] to add IV abx needs- confirmed with Meredeth Ide can add IV abx need to original referral. OPAT orders placed- Pam with [facility] to educate pt at bedside prior to discharge for home IV abx needs and will coordinate with [facility] for HH/home IV abx needs. RW has been ordered- call made to [facility] with [facility] for DME need- RW to be delivered to room prior to discharge- pt will be able to discharge later this afternoon once today's dose of abx given- bedside RN aware. HH will start tomorrow in the home   Final next level of care: Home w Home Health Services Barriers to Discharge: No Barriers Identified   Patient Goals and CMS Choice Patient states their goals for this hospitalization and ongoing recovery are:: I ready to go home and visit with my sister for a while. CMS Medicare.gov Compare Post Acute Care list provided to:: Patient Choice offered to / list presented to : Patient  Discharge Placement                 Home with [facility]      Discharge Plan and Services   Discharge Planning Services: CM Consult Post Acute Care Choice: Durable Medical Equipment, Home Health          DME Arranged: Walker rolling DME Agency: [employer] Date DME Agency Contacted: 05/30/19 Time DME Agency Contacted: 1000 Representative spoke with at DME Agency: [place]: RN, IV Antibiotics [place] Agency: [place] Date [place]:  05/30/19 Time [place]: 1030 Representative spoke with at [place]: [place] ([place]) Interventions     Readmission Risk Interventions Readmission Risk Prevention Plan 05/30/2019 05/27/2019  Transportation Screening Complete Complete  PCP or Specialist Appt within 5-7 Days Complete Complete  Home Care Screening Complete Complete  Medication Review (RN CM) Complete Complete  Some recent data might be hidden

## 2019-05-30 NOTE — Progress Notes (Signed)
Pt discharged home with sister. Telemetry box removed. Pt discharged home with PICC line. Home health arranged for antibiotic administration tomorrow. PICC line flushed and capped by IV team. Pt received discharge instructions and all questions were answered. Pt left with all of her belongings. Pt was discharged via wheelchair and was accompanied by pt's NT and RN.

## 2019-05-30 NOTE — Progress Notes (Signed)
MOBILITY TEAM - Progress Note   05/30/19 0859  Mobility  Activity Ambulated in hall  Level of Assistance Modified independent, requires aide device or extra time  Assistive Device Front wheel walker  LLE Weight Bearing  (RLE WB thru heel w/ darco shoe)  Distance Ambulated (ft) 120 ft  Mobility Response Tolerated well  Bed Position  (seated in recliner)   During activity: HR 125  Pt tolerated well, able to don R darco shoe independently prior to ambulation. Motivated to participate.  Mabeline Caras, PT, DPT Mobility Team Pager [phone]

## 2019-05-30 NOTE — Progress Notes (Signed)
  Subjective:  Patient ID: Tammy Good, female    DOB: 12/30/1950,  MRN: [id number]  HPI:  A 69 y.o. female presents with status post left foot partial fifth ray amputation with partial closure done by [name] postop day 5.  Patient states that she is doing overall really well.  She denies any other acute complaints.  She is in good spirits.  She states that she is feeling much better since yesterday.  She denies any foot pain or complaints.  The dressings were clean dry and intact and were removed by me today the wound were evaluated.  Objective:   Vitals:   05/29/19 2037 05/30/19 0544  BP: (!) 141/56 (!) 142/84  Pulse: 69 75  Resp: 17 (!) 22  Temp: 98.4 F (36.9 C) 98.2 F (36.8 C)  SpO2: 100% 100%   General AA&O x3. Normal mood and affect.  Vascular Dorsalis pedis and posterior tibial pulses 2/4 bilat. Brisk capillary refill to all digits. Pedal hair present.  Neurologic Epicritic sensation grossly intact.  Dermatologic Sutures are intact on the proximal and distal aspect with a granular wound present with some fibrotic tissue. There is no purulence.  There is improved edema.  There is no ascending erythema or ascending cellulitis.  No fluctuation or crepitation.  No malodor.  No open lesions or pre-ulcerative lesions.  No pain with calf compression, swelling, warmth, erythema  Orthopedic: MMT 5/5 in dorsiflexion, plantarflexion, inversion, and eversion. Normal joint ROM without pain or crepitus.    Assessment & Plan:  Patient was evaluated and treated and all questions answered.  Postop day 3 status post left partial fifth ray amputation -Patient was evaluated bedside by me.  All questions addressed -Agree with IDs recommendations for long-term management of IV antibiotics given that there is residual infection still present. -Partial weightbearing to the heel of the left foot. -Wound was evaluated no clinical signs of infection noted.  Wet-to-dry dressing was  applied. -Patient will be seen by [name] this week once discharged.  Patient will be contacted with an appointment likely Thursday versus Friday. -Patient is cleared to be discharged from podiatric standpoint.  Felipa Furnace, DPM  Accessible via secure chat for questions or concerns.

## 2019-05-30 NOTE — Telephone Encounter (Signed)
-----   Message from Carlyle Basques, MD sent at 05/29/2019  3:05 PM EST ----- Can I see her back in 4-6 wk for osteo. She is on vanco

## 2019-05-30 NOTE — Progress Notes (Signed)
MOBILITY TEAM - Progress Note   05/30/19 1337  Mobility  Activity Ambulated in hall  Level of Assistance Modified independent, requires aide device or extra time  Assistive Device Front wheel walker  Distance Ambulated (ft) 280 ft  Mobility Response Tolerated well  Bed Position Semi-fowlers   HR 86-126  Patient preparing for d/c home this afternoon. Front-wheel walker already delivered to room, adjusted for pt's height. Pt with good awareness of precautions and need for darco shoe.  Mabeline Caras, PT, DPT Mobility Team Pager [phone]

## 2019-05-30 NOTE — Discharge Summary (Signed)
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  She had failed oral antibiotics as an outpatient.  MRI of the left foot showed septic arthritis of the fifth metatarsophalangeal joint and advanced osteomyelitis involving the fifth metatarsal and fifth proximal phalanx, cellulitis, myositis of the left foot.  He underwent fifth ray amputation by [name].  Wound culture from the clean margin showed MRSA.  ID consulted and recommended to continue vancomycin till April 15.  Home health arranged.  Medically stable for discharge today.  Following problems were addressed during her hospitalization:  Septic arthritis of the left MTP joint: Imaging showed osteomyelitis involving fifth metatarsal and fifth proximal phalanx.  Underwent fifth ray amputation by podiatry.  Currently on vancomycin.  Culture of clean margin showed MRSA.    ID recommended vancomycin till April 15.  Continue partial weightbearing to the heel of the left foot. Physical therapy recommended rolling walker on discharge.  History of peripheral vascular disease: Left ABI of 0.64 with TBI of 0.35 . On  05/26/19, patient underwent aortogram, bilateral lower extremity arteriogram with runoff, left SFA revascularization with atherectomy left FSA angioplasty and stenting by vascular surgery.  Continue aspirin, Plavix, statin. Follow-up with vascular surgery as an outpatient.  Intractable nausea/vomiting:  Resolved  Coronary artery  disease with history of coronary stent: Continue aspirin, Plavix, statin Lipitor  Hypertension: Currently blood pressure stable.  Continue metoprolol.  Also takes lisinopril and hydrochlorothiazide at home  Insulin-dependent diabetes type 2: On Farxiga, Lantus, NovoLog, Lantus at home.  Chronic normocytic anemia: Currently hemoglobin stable.  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  Obesity: BMI of 35   Discharge Diagnoses:  Principal Problem:   Osteomyelitis of left foot (HCC) Active Problems:   CAD S/P percutaneous coronary angioplasty   Diabetes mellitus type 2 in obese [facility])   Essential hypertension   Osteomyelitis [facility])    Discharge Instructions  Discharge Instructions    Diet - low sodium heart healthy   Complete by: As directed    Discharge instructions   Complete by: As directed    1)Please take prescribed medication as instructed.  Follow-up with home health. 2)Follow up with your foot doctor in the next available appointment.   Home infusion instructions   Complete by: As directed    Instructions: Flushing of vascular access device: 0.9% NaCl pre/post medication administration and prn patency; Heparin 100 u/ml, 44m for implanted ports and Heparin 10u/ml, 590mfor all other central venous catheters.   Increase activity slowly   Complete by: As directed      Allergies as of 05/30/2019      Reactions   Strawberry Extract Itching, Swelling, Anaphylaxis   Mouth swells and gets itchy      Medication List    TAKE these medications   aspirin EC 81 MG tablet Take 81 mg by mouth daily.   atorvastatin 40 MG tablet Commonly known as: LIPITOR Take 1 tablet (40 mg total) by mouth daily at 6 PM. What changed: when  to take this   Basaglar KwikPen 100 UNIT/ML Inject 52 Units into the skin at bedtime. inject 52 units into skin at bedtime   dexamethasone 0.5  MG tablet Commonly known as: DECADRON Take 0.5 mg by mouth daily.   Farxiga 10 MG Tabs tablet Generic drug: dapagliflozin propanediol Take 10 mg by mouth daily.   ferrous sulfate 325 (65 FE) MG tablet Take 325 mg by mouth daily with breakfast.   Fish Oil 1000 MG Caps Take 1,000 mg by mouth daily.   hydrochlorothiazide 25 MG tablet Commonly known as: HYDRODIURIL Take 25 mg by mouth daily.   levothyroxine 175 MCG tablet Commonly known as: SYNTHROID Take 175 mcg by mouth daily before breakfast.   lisinopril 20 MG tablet Commonly known as: ZESTRIL Take 20 mg by mouth daily.   metoprolol tartrate 25 MG tablet Commonly known as: LOPRESSOR TAKE 1 TABLET TWICE A DAY   NovoLOG FlexPen 100 UNIT/ML FlexPen Generic drug: insulin aspart Inject 5-14 Units into the skin 3 (three) times daily with meals. Per sliding scale   OneTouch Verio test strip Generic drug: glucose blood USE 1 STRIP TO CHECK GLUCOSE THREE TIMES DAILY   pantoprazole 40 MG tablet Commonly known as: Protonix Take 1 tablet (40 mg total) by mouth 2 (two) times daily before a meal.   sulfamethoxazole-trimethoprim 400-80 MG tablet Commonly known as: Bactrim Take 1 tablet by mouth 2 (two) times daily.   vancomycin  IVPB Inject 1,500 mg into the vein daily. Indication:  Osteomyelitis Last Day of Therapy:  07/07/19 Labs - 'Sunday/Monday:  CBC/D, BMP, and vancomycin trough. Labs - Thursday:  BMP and vancomycin trough Labs - Every other week:  ESR and CRP            Home Infusion Instuctions  (From admission, onward)         Start     Ordered   05/30/19 0000  Home infusion instructions    Question:  Instructions  Answer:  Flushing of vascular access device: 0.9% NaCl pre/post medication administration and prn patency; Heparin 100 u/ml, 5ml for implanted ports and Heparin 10u/ml, 5ml for all other central venous catheters.   05/30/19 1119           Durable Medical Equipment  (From admission, onward)          Start     Ordered   05/29/19 1430  For home use only DME Walker rolling  Once    Question Answer Comment  Walker: With 5 Inch Wheels   Patient needs a walker to treat with the following condition Balance disorder      03' /07/21 1429         Follow-up Information    [facility] Follow up.   Why: rolling walker will be delivered to your room before discharge. Contact information: [place] [address] [phone]        [facility], [facility] Follow up.   Specialty: Home Health Services Why: Home health services for RN for your dressing changes and teaching will be provided through Bea Graff to call you to arrange services within the next 24-48 hours after discharge. Contact information: [place] [address] [phone]          Allergies  Allergen Reactions  . Strawberry Extract Itching, Swelling and Anaphylaxis    Mouth swells and gets itchy    Consultations:  Vascular surgery, podiatry, ID   Procedures/Studies: MR FOOT LEFT  WO CONTRAST  Result Date: 05/24/2019 CLINICAL DATA:  Open wound on the lateral aspect of the foot. EXAM: MRI OF THE LEFT FOOT WITHOUT CONTRAST TECHNIQUE: Multiplanar, multisequence MR imaging of the left foot was performed. No intravenous contrast was administered. COMPARISON:  Radiographs 05/18/2019 FINDINGS: Large open wound noted on the lateral and dorsal aspect of the foot at the level of the mid distal fifth metatarsal. Associated small fluid collection at the base of the wound is likely a small abscess. There may be a small drain in this area also. Surrounding diffuse subcutaneous soft tissue swelling/edema/fluid consistent with cellulitis. There is also moderate myofasciitis involving the lateral aspect of the forefoot. No findings suspicious for pyomyositis. Septic arthritis at the fifth MTP joint and advanced osteomyelitis involving the fifth metatarsal and fifth  proximal phalanx. As demonstrated on the plain films there is destructive bony change. The other bony structures are intact. I do not see any other definite sites of osteomyelitis or septic arthritis. The major tendons and ligaments appear intact. IMPRESSION: 1. Large open wound on the lateral and dorsal aspect of the foot at the level of the mid distal fifth metatarsal. 2. Septic arthritis at the fifth MTP joint and advanced osteomyelitis involving the fifth metatarsal and fifth proximal phalanx. 3. Cellulitis and myofasciitis but no findings for pyomyositis. Electronically Signed   By: Marijo Sanes M.D.   On: 05/24/2019 08:02   PERIPHERAL VASCULAR CATHETERIZATION  Result Date: 05/26/2019 Patient name: Tammy Good   MRN: [id number]        DOB: 1950-10-11            Sex: female  05/26/2019 Pre-operative Diagnosis: Left foot wound status post left fifth ray amputation in the setting of depressed ABIs and underlying peripheral arterial disease Post-operative diagnosis:  Same Surgeon:  Marty Heck, MD Procedure Performed: 1.  Ultrasound-guided access of the right common femoral artery 2.  Aortogram 3.  Bilateral lower extremity arteriogram with runoff including selection of third order branches in the left lower extremity 4.  Left SFA revascularization with mechanical orbital atherectomy (1.5 CSI solid) 5.  Left SFA angioplasty including drug-coated balloon (5 mm x 150 mm Sterling and 5 mm x 200 mm drug-coated Ranger) 6.  Left SFA proximal stent placement for focal dissection (6 mm x 40 mm drug-coated Eluvia) 7.  Mynx closure of the right common femoral artery 8.  92 minutes of monitored moderate conscious sedation time   Indications: 69 year old female that presented with left foot wound and had depressed ABIs of 0.6 in the left lower extremity.  Ultimately she underwent ray amputation of the left fifth toe with podiatry yesterday.  She presents today for planned revascularization after risk and  benefits are discussed.  Findings:  Aortogram showed no flow-limiting aortoiliac stenosis.  Left lower extremity arteriogram showed a patent common femoral and profunda but a very diseased SFA over approximate 200 mm course with multiple focal lesions in the 50 to 60% stenotic range.  The above and below-knee popliteal artery are widely patent and the patient has three-vessel runoff.   Ultimately the left SFA was crossed and mechanical atherectomy was performed at low medium and high speeds and then this was angioplastied with initially a 5 mm balloon and then a 5 mm drug-coated balloon.  There was a focal dissection in the proximal SFA that was stented with a short 6 x 40 drug-coated Eluvia.  There is now inline flow down the SFA with no residual stenosis and  preserved three-vessel runoff.  Right lower extremity shows heavily calcified and diseased SFA very consistent with her disease distribution in the left lower extremity.  She appears to have patent popliteal and least two-vessel runoff in the anterior tibial and posterior tibial artery (difficult to evaluate peroneal).              Procedure:  The patient was identified in the holding area and taken to room 8.  The patient was then placed supine on the table and prepped and draped in the usual sterile fashion.  A time out was called.  Ultrasound was used to evaluate the right common femoral artery.  It was patent .  A digital ultrasound image was acquired.  A micropuncture needle was used to access the right common femoral artery under ultrasound guidance.  An 018 wire was advanced without resistance and a micropuncture sheath was placed.  The 018 wire was removed and a versacore wire was placed.  The micropuncture sheath was exchanged for a 5 french sheath.  Initially had some trouble advancing the sheath over the versa core and had to exchange for a Rosen wire for more support.  An omniflush catheter was advanced over the wire to the level of L-1.  An  abdominal angiogram was obtained.  The catheter was pulled down and bilateral lower extremity runoff was obtained with pertinent findings noted above.  Given the planned intervention on the left lower extremity after review of imaging, we elected to treat the left SFA after reviewing the images.  Glidewire advantage was used with a Omni Flush catheter to select the left iliac and ultimately advanced our catheter to the distal external iliac on the left side.  Ultimately a 7 [place] Ansell sheath was placed in the right groin over the aortic bifurcation into the left common femoral artery.  The patient was given 100 units/kg heparin IV.  Then used the Glidewire advantage with a quick cross catheter and crossed the SFA disease into the above-knee popliteal artery.  Exchanged for a Dietitian.  Ultimately NAV6 6 embolic protection filter was deployed in the above-knee popliteal artery for distal embolic protection.  A 1.5 solid CSI atherectomy catheter was selected and mechanical atherectomy was performed of the proximal to mid SFA over approximate 200 mm length at low medium and high speeds.  We gave Viper slide throughout the case as well as nitroglycerin.  Ultimately we then ballooned this entire segment with a 5 mm Sterling.  On another injection through the sheath after the mechanical atherectomy device was removed there was a focal dissection in the proximal SFA.  I elected to then treat the SFA with a 5 mm drug-coated Ranger that was inflated to profile at low pressure.  We left this inflated for 3 minutes to try and treat the dissection.  I then did another hand-injection through the sheath that showed again this focal dissection in the proximal SFA.  At that point I thought we needed to stent in order to prevent the SFA from having any flow limitation.  A short 6 mm x 40 mm drug-coated Eluvia was selected and deployed in the proximal SFA across the dissection.  This was postdilated with a 5 mm balloon to  nominal pressure.  We did one final injection down the left lower extremity and had excellent inline flow down the SFA popliteal and three-vessel runoff with no residual stenosis.  At that point in time the NAV filter was retrieved and the Viper wire  was removed.  I then placed a Rosen wire back down through the sheath and we exchanged for a short 7 French sheath in the right common femoral artery.  A mynx closure device was deployed in the right common femoral artery and manual pressure was held.  She was taken to PACU in stable condition.     Plan: Patient was loaded on plavix after left SFA intervention and should remain on aspirin and plavix.  Marty Heck, MD [facility] Office: [phone]  DG Foot Complete Left  Result Date: 05/25/2019 CLINICAL DATA:  Osteomyelitis of the fifth ray. Status post amputation of the left fifth ray at the level of the proximal shaft of the fifth metatarsal. EXAM: LEFT FOOT - COMPLETE 3+ VIEW COMPARISON:  Radiographs dated 05/18/2019 and MRI dated 05/24/2019 FINDINGS: The patient has undergone surgical amputation of the fifth ray at the level of the proximal shaft of the fifth metatarsal. Surgical margin is sharp. The bones of the remainder of the left foot appear normal. Extensive vascular calcifications at the foot and ankle. IMPRESSION: Status post amputation of the fifth ray as described. Electronically Signed   By: [name] M.D.   On: 05/25/2019 16:32   VAS [place] ABI WITH/WO TBI  Result Date: 05/25/2019 LOWER EXTREMITY DOPPLER STUDY Indications: Ulceration. High Risk Factors: Hypertension, hyperlipidemia, Diabetes.  Comparison Study: No prior studies. Performing Technologist: Carlos Levering Rvt  Examination Guidelines: A complete evaluation includes at minimum, Doppler waveform signals and systolic blood pressure reading at the level of bilateral brachial, anterior tibial, and posterior tibial arteries, when vessel segments  are accessible. Bilateral testing is considered an integral part of a complete examination. Photoelectric Plethysmograph (PPG) waveforms and toe systolic pressure readings are included as required and additional duplex testing as needed. Limited examinations for reoccurring indications may be performed as noted.  ABI Findings: +---------+------------------+-----+---------+--------+ Right    Rt Pressure (mmHg)IndexWaveform Comment  +---------+------------------+-----+---------+--------+ Brachial 167                    triphasic         +---------+------------------+-----+---------+--------+ PTA      112               0.67 biphasic          +---------+------------------+-----+---------+--------+ DP       111               0.66 biphasic          +---------+------------------+-----+---------+--------+ Great Toe55                0.33                   +---------+------------------+-----+---------+--------+ +---------+------------------+-----+----------+-------+ Left     Lt Pressure (mmHg)IndexWaveform  Comment +---------+------------------+-----+----------+-------+ Brachial 157                    triphasic         +---------+------------------+-----+----------+-------+ PTA      104               0.62 biphasic          +---------+------------------+-----+----------+-------+ DP       107               0.64 monophasic        +---------+------------------+-----+----------+-------+ Great Toe59                0.35                   +---------+------------------+-----+----------+-------+ +-------+-----------+-----------+------------+------------+  ABI/TBIToday's ABIToday's TBIPrevious ABIPrevious TBI +-------+-----------+-----------+------------+------------+ Right  0.67       0.33                                +-------+-----------+-----------+------------+------------+ Left   0.64       0.35                                 +-------+-----------+-----------+------------+------------+  Summary: Right: Resting right ankle-brachial index indicates moderate right lower extremity arterial disease. The right toe-brachial index is abnormal. Left: Resting left ankle-brachial index indicates moderate left lower extremity arterial disease. The left toe-brachial index is abnormal.  *See table(s) above for measurements and observations.  Electronically signed by Harold Barban MD on 05/25/2019 at 8:56:41 PM.    Final    [place] EKG SITE RITE  Result Date: 05/29/2019 If Site Rite image not attached, placement could not be confirmed due to current cardiac rhythm.      Subjective: Patient seen and examined at the bedside this morning.  Medically stable for discharge.  Discharge Exam: Vitals:   05/29/19 2037 05/30/19 0544  BP: (!) 141/56 (!) 142/84  Pulse: 69 75  Resp: 17 (!) 22  Temp: 98.4 F (36.9 C) 98.2 F (36.8 C)  SpO2: 100% 100%   Vitals:   05/29/19 1754 05/29/19 1939 05/29/19 2037 05/30/19 0544  BP: (!) 148/58 (!) 141/68 (!) 141/56 (!) 142/84  Pulse: 68 83 69 75  Resp: '17 19 17 ' (!) 22  Temp: 98.6 F (37 C)  98.4 F (36.9 C) 98.2 F (36.8 C)  TempSrc: Oral  Oral Oral  SpO2: 100% 100% 100% 100%  Weight:      Height:        General: Pt is alert, awake, not in acute distress Cardiovascular: RRR, S1/S2 +, no rubs, no gallops Respiratory: CTA bilaterally, no wheezing, no rhonchi Abdominal: Soft, NT, ND, bowel sounds + Extremities: no edema, no cyanosis    The results of significant diagnostics from this hospitalization (including imaging, microbiology, ancillary and laboratory) are listed below for reference.     Microbiology: Recent Results (from the past 240 hour(s))  Blood culture (routine x 2)     Status: None   Collection Time: 05/23/19 10:17 PM   Specimen: BLOOD  Result Value Ref Range Status   Specimen Description BLOOD LEFT ANTECUBITAL  Final   Special Requests   Final    BOTTLES DRAWN AEROBIC  AND ANAEROBIC Blood Culture results may not be optimal due to an inadequate volume of blood received in culture bottles   Culture   Final    NO GROWTH 5 DAYS Performed at [facility], [place] [address], [place], [place] [address]    Report Status 05/28/2019 FINAL  Final  Blood culture (routine x 2)     Status: None   Collection Time: 05/23/19 10:33 PM   Specimen: BLOOD RIGHT HAND  Result Value Ref Range Status   Specimen Description BLOOD RIGHT HAND  Final   Special Requests   Final    BOTTLES DRAWN AEROBIC AND ANAEROBIC Blood Culture results may not be optimal due to an inadequate volume of blood received in culture bottles   Culture   Final    NO GROWTH 5 DAYS Performed at [facility], [place] [address] [place], [place] [address]    Report Status 05/28/2019  FINAL  Final  SARS CORONAVIRUS 2 (TAT 6-24 HRS) Nasopharyngeal Nasopharyngeal Swab     Status: None   Collection Time: 05/24/19 12:25 AM   Specimen: Nasopharyngeal Swab  Result Value Ref Range Status   SARS Coronavirus 2 NEGATIVE NEGATIVE Final    Comment: (NOTE) SARS-CoV-2 target nucleic acids are NOT DETECTED. The SARS-CoV-2 RNA is generally detectable in upper and lower respiratory specimens during the acute phase of infection. Negative results do not preclude SARS-CoV-2 infection, do not rule out co-infections with other pathogens, and should not be used as the sole basis for treatment or other patient management decisions. Negative results must be combined with clinical observations, patient history, and epidemiological information. The expected result is Negative. Fact Sheet for Patients: SugarRoll.be Fact Sheet for Healthcare Providers: https://www.woods-mathews.com/ This test is not yet approved or cleared by the [place] FDA and  has been authorized for detection and/or diagnosis of SARS-CoV-2 by FDA under an Emergency Use Authorization (EUA). This EUA will  remain  in effect (meaning this test can be used) for the duration of the COVID-19 declaration under Section 56 4(b)(1) of the Act, 21 U.S.C. section 360bbb-3(b)(1), unless the authorization is terminated or revoked sooner. Performed at [facility], [place] [address] [place], [place] [address]   Surgical pcr screen     Status: Abnormal   Collection Time: 05/24/19  1:25 PM   Specimen: Nasal Mucosa; Nasal Swab  Result Value Ref Range Status   MRSA, PCR POSITIVE (A) NEGATIVE Final    Comment: RESULT CALLED TO, READ BACK BY AND VERIFIED WITH: Eulogio Ditch RN 15:45 05/24/19 ([email or web address])    Staphylococcus aureus POSITIVE (A) NEGATIVE Final    Comment: (NOTE) The Xpert SA Assay (FDA approved for NASAL specimens in patients 83 years of age and older), is one component of a comprehensive surveillance program. It is not intended to diagnose infection nor to guide or monitor treatment. Performed at [facility], [place] [address], [place], [place] [address]   Aerobic/Anaerobic Culture (surgical/deep wound)     Status: None (Preliminary result)   Collection Time: 05/25/19 12:49 PM   Specimen: Wound  Result Value Ref Range Status   Specimen Description WOUND LEFT FOOT  Final   Special Requests PATIENT ON FOLLOWING FLAGYL, ROCEPHIN  Final   Gram Stain NO WBC SEEN NO ORGANISMS SEEN   Final   Culture   Final    NO GROWTH 4 DAYS NO ANAEROBES ISOLATED; CULTURE IN PROGRESS FOR 5 DAYS Performed at [facility], [address] [place], [place] [address]    Report Status PENDING  Incomplete  Acid Fast Smear (AFB)     Status: None   Collection Time: 05/25/19 12:49 PM   Specimen: Wound  Result Value Ref Range Status   AFB Specimen Processing Concentration  Final   Acid Fast Smear Negative  Final    Comment: (NOTE) Performed At: [facility] [place], [place] [id number] Rush Farmer MD [id number]    Source (AFB) WOUND  Final    Comment: LEFT FOOT Performed  at [facility], [place] [address], [place], [place] [address]   Aerobic/Anaerobic Culture (surgical/deep wound)     Status: None (Preliminary result)   Collection Time: 05/25/19 12:54 PM   Specimen: Wound  Result Value Ref Range Status   Specimen Description TISSUE LEFT FIFTH METATARSAL  Final   Special Requests PATIENT ON FOLLOWING FLAGYL, ROCEPHIN  Final   Gram Stain NO  WBC SEEN NO ORGANISMS SEEN   Final   Culture   Final    RARE METHICILLIN RESISTANT STAPHYLOCOCCUS AUREUS NO ANAEROBES ISOLATED; CULTURE IN PROGRESS FOR 5 DAYS RESULT CALLED TO, READ BACK BY AND VERIFIED WITH: RN Dione Plover [id number] Performed at [facility], [place] [address], [place], [place] [address]    Report Status PENDING  Incomplete   Organism ID, Bacteria METHICILLIN RESISTANT STAPHYLOCOCCUS AUREUS  Final      Susceptibility   Methicillin resistant staphylococcus aureus - MIC*    CIPROFLOXACIN <=0.5 SENSITIVE Sensitive     ERYTHROMYCIN 4 INTERMEDIATE Intermediate     GENTAMICIN <=0.5 SENSITIVE Sensitive     OXACILLIN >=4 RESISTANT Resistant     TETRACYCLINE <=1 SENSITIVE Sensitive     VANCOMYCIN <=0.5 SENSITIVE Sensitive     TRIMETH/SULFA <=10 SENSITIVE Sensitive     CLINDAMYCIN <=0.25 SENSITIVE Sensitive     RIFAMPIN <=0.5 SENSITIVE Sensitive     Inducible Clindamycin NEGATIVE Sensitive     * RARE METHICILLIN RESISTANT STAPHYLOCOCCUS AUREUS  Acid Fast Smear (AFB)     Status: None   Collection Time: 05/25/19 12:54 PM   Specimen: Wound  Result Value Ref Range Status   AFB Specimen Processing Comment  Final    Comment: Tissue Grinding and Digestion/Decontamination   Acid Fast Smear Negative  Final    Comment: (NOTE) Performed At: [facility] [address] [place], [place] [id number] Rush Farmer MD [id number]    Source (AFB) TISSUE  Final    Comment: LEFT FIFTH METATARSAL Performed at [facility], [place] [address] [place], [place] [address]      Labs: BNP  (last 3 results) No results for input(s): BNP in the last 8760 hours. Basic Metabolic Panel: Recent Labs  Lab 05/24/19 0156 05/25/19 0301 05/26/19 0342 05/27/19 0930 05/30/19 0500  NA 135 140 141 139 139  K 4.1 4.0 4.1 3.6 3.8  CL 102 109 108 108 111  CO2 21* '23 24 22 22  ' GLUCOSE 160* 150* 122* 128* 100*  BUN 26* 16 10 <5* <5*  CREATININE 1.82* 1.34* 0.93 0.75 0.64  CALCIUM 7.9* 8.0* 8.5* 8.3* 8.0*  MG  --   --   --  1.6* 1.6*   Liver Function Tests: Recent Labs  Lab 05/23/19 1552  AST 16  ALT 16  ALKPHOS 85  BILITOT 0.6  PROT 7.2  ALBUMIN 2.8*   No results for input(s): LIPASE, AMYLASE in the last 168 hours. No results for input(s): AMMONIA in the last 168 hours. CBC: Recent Labs  Lab 05/23/19 1757 05/23/19 1757 05/24/19 0156 05/25/19 0301 05/26/19 0342 05/27/19 0930 05/30/19 0500  WBC 9.4   < > 9.2 8.1 7.3 6.8 9.9  NEUTROABS 7.1  --   --   --   --  4.0 7.3  HGB 11.2*   < > 9.8* 9.3* 9.6* 9.5* 9.9*  HCT 36.4   < > 31.3* 30.2* 31.4* 31.5* 32.6*  MCV 94.1   < > 91.3 92.6 94.3 93.8 95.0  PLT 620*   < > 518* 507* 518* 450* 471*   < > = values in this interval not displayed.   Cardiac Enzymes: No results for input(s): CKTOTAL, CKMB, CKMBINDEX, TROPONINI in the last 168 hours. BNP: Invalid input(s): POCBNP CBG: Recent Labs  Lab 05/29/19 0628 05/29/19 1151 05/29/19 1716 05/29/19 2126 05/30/19 0625  GLUCAP 114* 133* 144* 157* 81   D-Dimer No results for input(s): DDIMER in the  last 72 hours. Hgb A1c No results for input(s): HGBA1C in the last 72 hours. Lipid Profile No results for input(s): CHOL, HDL, LDLCALC, TRIG, CHOLHDL, LDLDIRECT in the last 72 hours. Thyroid function studies No results for input(s): TSH, T4TOTAL, T3FREE, THYROIDAB in the last 72 hours.  Invalid input(s): FREET3 Anemia work up No results for input(s): VITAMINB12, FOLATE, FERRITIN, TIBC, IRON, RETICCTPCT in the last 72 hours. Urinalysis    Component Value Date/Time    COLORURINE STRAW (A) 05/24/2019 0918   APPEARANCEUR CLEAR 05/24/2019 0918   LABSPEC 1.012 05/24/2019 0918   PHURINE 5.0 05/24/2019 0918   GLUCOSEU 150 (A) 05/24/2019 0918   HGBUR NEGATIVE 05/24/2019 0918   BILIRUBINUR NEGATIVE 05/24/2019 0918   KETONESUR NEGATIVE 05/24/2019 0918   PROTEINUR NEGATIVE 05/24/2019 0918   NITRITE NEGATIVE 05/24/2019 0918   LEUKOCYTESUR NEGATIVE 05/24/2019 0918   Sepsis Labs Invalid input(s): PROCALCITONIN,  WBC,  LACTICIDVEN Microbiology Recent Results (from the past 240 hour(s))  Blood culture (routine x 2)     Status: None   Collection Time: 05/23/19 10:17 PM   Specimen: BLOOD  Result Value Ref Range Status   Specimen Description BLOOD LEFT ANTECUBITAL  Final   Special Requests   Final    BOTTLES DRAWN AEROBIC AND ANAEROBIC Blood Culture results may not be optimal due to an inadequate volume of blood received in culture bottles   Culture   Final    NO GROWTH 5 DAYS Performed at [facility], [place] [address] [place], [place] [address]    Report Status 05/28/2019 FINAL  Final  Blood culture (routine x 2)     Status: None   Collection Time: 05/23/19 10:33 PM   Specimen: BLOOD RIGHT HAND  Result Value Ref Range Status   Specimen Description BLOOD RIGHT HAND  Final   Special Requests   Final    BOTTLES DRAWN AEROBIC AND ANAEROBIC Blood Culture results may not be optimal due to an inadequate volume of blood received in culture bottles   Culture   Final    NO GROWTH 5 DAYS Performed at [facility], [place] [address], [place], [place] [address]    Report Status 05/28/2019 FINAL  Final  SARS CORONAVIRUS 2 (TAT 6-24 HRS) Nasopharyngeal Nasopharyngeal Swab     Status: None   Collection Time: 05/24/19 12:25 AM   Specimen: Nasopharyngeal Swab  Result Value Ref Range Status   SARS Coronavirus 2 NEGATIVE NEGATIVE Final    Comment: (NOTE) SARS-CoV-2 target nucleic acids are NOT DETECTED. The SARS-CoV-2 RNA is generally detectable in upper and  lower respiratory specimens during the acute phase of infection. Negative results do not preclude SARS-CoV-2 infection, do not rule out co-infections with other pathogens, and should not be used as the sole basis for treatment or other patient management decisions. Negative results must be combined with clinical observations, patient history, and epidemiological information. The expected result is Negative. Fact Sheet for Patients: SugarRoll.be Fact Sheet for Healthcare Providers: https://www.woods-mathews.com/ This test is not yet approved or cleared by the [place] FDA and  has been authorized for detection and/or diagnosis of SARS-CoV-2 by FDA under an Emergency Use Authorization (EUA). This EUA will remain  in effect (meaning this test can be used) for the duration of the COVID-19 declaration under Section 56 4(b)(1) of the Act, 21 U.S.C. section 360bbb-3(b)(1), unless the authorization is terminated or revoked sooner. Performed at [facility], [place] [address], [place], [place] [address]   Surgical pcr  screen     Status: Abnormal   Collection Time: 05/24/19  1:25 PM   Specimen: Nasal Mucosa; Nasal Swab  Result Value Ref Range Status   MRSA, PCR POSITIVE (A) NEGATIVE Final    Comment: RESULT CALLED TO, READ BACK BY AND VERIFIED WITH: Eulogio Ditch RN 15:45 05/24/19 ([email or web address])    Staphylococcus aureus POSITIVE (A) NEGATIVE Final    Comment: (NOTE) The Xpert SA Assay (FDA approved for NASAL specimens in patients 67 years of age and older), is one component of a comprehensive surveillance program. It is not intended to diagnose infection nor to guide or monitor treatment. Performed at [facility], [place] [address], [place], [place] [address]   Aerobic/Anaerobic Culture (surgical/deep wound)     Status: None (Preliminary result)   Collection Time: 05/25/19 12:49 PM   Specimen: Wound  Result Value Ref Range Status   Specimen  Description WOUND LEFT FOOT  Final   Special Requests PATIENT ON FOLLOWING FLAGYL, ROCEPHIN  Final   Gram Stain NO WBC SEEN NO ORGANISMS SEEN   Final   Culture   Final    NO GROWTH 4 DAYS NO ANAEROBES ISOLATED; CULTURE IN PROGRESS FOR 5 DAYS Performed at [facility], [address], [place], [place] [address]    Report Status PENDING  Incomplete  Acid Fast Smear (AFB)     Status: None   Collection Time: 05/25/19 12:49 PM   Specimen: Wound  Result Value Ref Range Status   AFB Specimen Processing Concentration  Final   Acid Fast Smear Negative  Final    Comment: (NOTE) Performed At: [facility] [place], [place] [id number] Rush Farmer MD [id number]    Source (AFB) WOUND  Final    Comment: LEFT FOOT Performed at [facility], [place] [address], [place], [place] [address]   Aerobic/Anaerobic Culture (surgical/deep wound)     Status: None (Preliminary result)   Collection Time: 05/25/19 12:54 PM   Specimen: Wound  Result Value Ref Range Status   Specimen Description TISSUE LEFT FIFTH METATARSAL  Final   Special Requests PATIENT ON FOLLOWING FLAGYL, ROCEPHIN  Final   Gram Stain NO WBC SEEN NO ORGANISMS SEEN   Final   Culture   Final    RARE METHICILLIN RESISTANT STAPHYLOCOCCUS AUREUS NO ANAEROBES ISOLATED; CULTURE IN PROGRESS FOR 5 DAYS RESULT CALLED TO, READ BACK BY AND VERIFIED WITH: RN Dione Plover [id number] Performed at [facility], [address] [place], [place] [address]    Report Status PENDING  Incomplete   Organism ID, Bacteria METHICILLIN RESISTANT STAPHYLOCOCCUS AUREUS  Final      Susceptibility   Methicillin resistant staphylococcus aureus - MIC*    CIPROFLOXACIN <=0.5 SENSITIVE Sensitive     ERYTHROMYCIN 4 INTERMEDIATE Intermediate     GENTAMICIN <=0.5 SENSITIVE Sensitive     OXACILLIN >=4 RESISTANT Resistant     TETRACYCLINE <=1 SENSITIVE Sensitive     VANCOMYCIN <=0.5 SENSITIVE Sensitive     TRIMETH/SULFA  <=10 SENSITIVE Sensitive     CLINDAMYCIN <=0.25 SENSITIVE Sensitive     RIFAMPIN <=0.5 SENSITIVE Sensitive     Inducible Clindamycin NEGATIVE Sensitive     * RARE METHICILLIN RESISTANT STAPHYLOCOCCUS AUREUS  Acid Fast Smear (AFB)     Status: None   Collection Time: 05/25/19 12:54 PM   Specimen: Wound  Result Value Ref Range Status   AFB Specimen Processing Comment  Final    Comment: Tissue Grinding  and Digestion/Decontamination   Acid Fast Smear Negative  Final    Comment: (NOTE) Performed At: [place], [place] [id number] Rush Farmer MD [id number]    Source (AFB) TISSUE  Final    Comment: LEFT FIFTH METATARSAL Performed at [facility], [place] [address] [place], [place] [address]     Please note: You were cared for by a hospitalist during your hospital stay. Once you are discharged, your primary care physician will handle any further medical issues. Please note that NO REFILLS for any discharge medications will be authorized once you are discharged, as it is imperative that you return to your primary care physician (or establish a relationship with a primary care physician if you do not have one) for your post hospital discharge needs so that they can reassess your need for medications and monitor your lab values.    Time coordinating discharge: 40 minutes  SIGNED:   Shelly Coss, MD  [facility] 05/30/2019, 11:19 AM Pager [phone]  If 7PM-7AM, please contact night-coverage [email or web address] Password [email or web address]

## 2019-05-30 NOTE — Telephone Encounter (Signed)
New OPAT patient. Also needs hospital follow up scheduled, please. Landis Gandy, RN

## 2019-05-31 ENCOUNTER — Telehealth: Payer: Self-pay | Admitting: *Deleted

## 2019-05-31 NOTE — Telephone Encounter (Signed)
I told pt the dressing could stay in place until Thursday.

## 2019-05-31 NOTE — Telephone Encounter (Signed)
Pt states she has surgery 05/23/2019 and [facility] will come Thursday.

## 2019-06-01 ENCOUNTER — Telehealth: Payer: Self-pay

## 2019-06-01 NOTE — Telephone Encounter (Signed)
-----   Message from Andres Ege, RN sent at 05/30/2019 10:20 AM EST ----- Regarding: FW: f/u with [name] Please assist Dr. Posey Pronto and pt with an appt. Marcy Siren ----- Message ----- From: Felipa Furnace, DPM Sent: 05/30/2019   7:28 AM EST To: Andres Ege, RN Subject: f/u with Dr. Beatriz Stallion,  Can he have this patient follow with Dr. Earleen Newport.  Patient is a postop.  Patient is currently at the hospital should be discharging today or tomorrow.  Maybe she can follow-up this week on Thursday or Friday.  Thanks Lennette Bihari

## 2019-06-01 NOTE — Telephone Encounter (Signed)
Called lvm to sched

## 2019-06-01 NOTE — Telephone Encounter (Signed)
For now lets do a saline wet to dry to the wound followed by 4x4, kerlix, ACE. Change every other day. Also, I need to get her an appointment to come in to be seen for a postop. Thank you.

## 2019-06-01 NOTE — Telephone Encounter (Signed)
Inez Catalina from [facility], called stating that pt was discharged from the hospital on Monday 05/30/19. [facility] [place] care needs orders for wound care. Please give Inez Catalina a called back at [phone].

## 2019-06-02 ENCOUNTER — Telehealth: Payer: Self-pay | Admitting: *Deleted

## 2019-06-02 ENCOUNTER — Ambulatory Visit (INDEPENDENT_AMBULATORY_CARE_PROVIDER_SITE_OTHER): Payer: BC Managed Care – PPO

## 2019-06-02 ENCOUNTER — Ambulatory Visit (INDEPENDENT_AMBULATORY_CARE_PROVIDER_SITE_OTHER): Payer: BC Managed Care – PPO | Admitting: Podiatry

## 2019-06-02 ENCOUNTER — Other Ambulatory Visit: Payer: Self-pay

## 2019-06-02 DIAGNOSIS — Z9889 Other specified postprocedural states: Secondary | ICD-10-CM | POA: Diagnosis not present

## 2019-06-02 DIAGNOSIS — L03119 Cellulitis of unspecified part of limb: Secondary | ICD-10-CM

## 2019-06-02 DIAGNOSIS — L02619 Cutaneous abscess of unspecified foot: Secondary | ICD-10-CM | POA: Diagnosis not present

## 2019-06-02 DIAGNOSIS — L03116 Cellulitis of left lower limb: Secondary | ICD-10-CM | POA: Diagnosis not present

## 2019-06-02 DIAGNOSIS — M869 Osteomyelitis, unspecified: Secondary | ICD-10-CM

## 2019-06-02 NOTE — Telephone Encounter (Signed)
Tammy Good states pt was discharged from hospital 05/30/2019 and they need wound care orders.

## 2019-06-03 NOTE — Telephone Encounter (Signed)
Faxed Dr. Leigh [place] orders of 06/03/2019 7:23am to [facility].

## 2019-06-03 NOTE — Telephone Encounter (Signed)
For now lets do a saline wet to dry to the wound followed by 4x4, kerlix, ACE.   Also, I sent this 2 days ago. Can someone please make sure it gets sent to home health today?

## 2019-06-08 ENCOUNTER — Encounter: Payer: Self-pay | Admitting: Podiatry

## 2019-06-09 ENCOUNTER — Ambulatory Visit (INDEPENDENT_AMBULATORY_CARE_PROVIDER_SITE_OTHER): Payer: BC Managed Care – PPO | Admitting: Podiatry

## 2019-06-09 ENCOUNTER — Encounter: Payer: Self-pay | Admitting: Podiatry

## 2019-06-09 ENCOUNTER — Other Ambulatory Visit: Payer: Self-pay

## 2019-06-09 VITALS — Temp 97.7°F

## 2019-06-09 DIAGNOSIS — L97522 Non-pressure chronic ulcer of other part of left foot with fat layer exposed: Secondary | ICD-10-CM | POA: Diagnosis not present

## 2019-06-09 DIAGNOSIS — M869 Osteomyelitis, unspecified: Secondary | ICD-10-CM

## 2019-06-12 NOTE — Progress Notes (Signed)
Subjective: Tammy Good is a 69 y.o. 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  Overall she states that she is doing well.  She is currently on IV antibiotics at home with a PICC line.  Nursing has not been doing the dressing changes yet.  Denies any systemic complaints such as fevers, chills, nausea, vomiting. No calf pain, chest pain, shortness of breath.   Objective: General: No acute distress, AAOx3  DP/PT pulses palpable LEFT foot: Incision is well coapted without any evidence of dehiscence distal aspect.  There is progressive, small amount of granulation tissue present.  There is no exposed bone or tendon today.  There is no surrounding erythema, ascending cellulitis there is no fluctuation crepitation.  There is no malodor.  Wound appears to be stable currently. No other areas of tenderness to bilateral lower extremities.  No other open lesions or pre-ulcerative lesions.  No pain with calf compression, swelling, warmth, erythema.   Assessment and Plan:  Status post left foot surgery, doing well with no complications   -Treatment options discussed including all alternatives, risks, and complications -X-rays obtained reviewed.  Amputation margin appears to be sharp.  There is some indistinctness of questionable significance.  And this is where I took some residual bone for clean margin as opposed to osteomyelitis but we will continue to monitor. -Wound was clean.  Saline wet-to-dry applied.  New wound care orders were sent to home health to do similar dressing with wet-to-dry to the wound. -Encouraged to stay off the foot is much as possible and elevation.  Continue Darco wedge shoe. -Continue current antibiotics and follow-up with infectious disease as scheduled -.mwinfectin  Follow-up in 1 week or sooner if needed  Trula Slade DPM

## 2019-06-13 NOTE — Progress Notes (Signed)
Subjective: Tammy Good is a 69 y.o. 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  For that she is doing well.  She is not having much pain today.  Home health has been coming to change the bandage with wet-to-dry.  She is on IV antibiotics as well and tolerating well but any side effects.  She denies any increase in swelling and redness of the foot that she can tell.  Denies any fevers, chills, nausea, vomiting.  No calf pain, chest pain or shortness of breath.  Objective: General: No acute distress, AAOx3  DP/PT pulses palpable LEFT foot: Incision is well coapted without any evidence of dehiscence distal aspect.  The wound is starting to fill and has increased granulation tissue present.  Small amount of fibrotic tissue.  There is no surrounding erythema, ascending cellulitis.  There is no fluctuation crepitation.  There is malodor.  There is no exposed bone or tendon today. No other areas of tenderness to bilateral lower extremities.  No other open lesions or pre-ulcerative lesions.  No pain with calf compression, swelling, warmth, erythema.   Assessment and Plan:  Status post left foot surgery, doing well with no complications   -Treatment options discussed including all alternatives, risks, and complications -Sharply debrided the wound, bleeding, granular tissue.  Wearing continue saline wet-to-dry dressing changes for now.  The wound has increased granulation tissue.  No obvious signs of infection.  We will continue to monitor.  Continue current antibiotics and follow-up with infectious disease as scheduled. -Monitor for any clinical signs or symptoms of infection and directed to call the office immediately should any occur or go to the ER.  Return in about 10 days (around 06/19/2019).  Trula Slade DPM

## 2019-06-15 ENCOUNTER — Telehealth: Payer: Self-pay | Admitting: Pharmacist

## 2019-06-15 NOTE — Telephone Encounter (Signed)
Reviewing patient's OPAT labs. 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 Her potassium from 3/18 was 2.9. Labs were repeated on 3/22 but potassium sample "could not be drawn".  Also noticed on 3/22 labs that her SCr went from 1.08 to 2.03. Vanc trough from 3/22 was 25.2.   Called and spoke to Amy, pharmacist at [facility]. She is having patient hold vancomycin and repeat labs will be drawn in the morning including a BMET and a vanc trough. Will follow up on tomorrow's labs and replace potassium if needed.

## 2019-06-21 ENCOUNTER — Encounter: Payer: Self-pay | Admitting: Podiatry

## 2019-06-21 ENCOUNTER — Ambulatory Visit: Payer: BC Managed Care – PPO | Admitting: Adult Health

## 2019-06-21 ENCOUNTER — Telehealth: Payer: Self-pay | Admitting: Pharmacist

## 2019-06-21 ENCOUNTER — Ambulatory Visit (INDEPENDENT_AMBULATORY_CARE_PROVIDER_SITE_OTHER): Payer: BC Managed Care – PPO | Admitting: Podiatry

## 2019-06-21 ENCOUNTER — Telehealth: Payer: Self-pay

## 2019-06-21 ENCOUNTER — Other Ambulatory Visit: Payer: Self-pay

## 2019-06-21 VITALS — Temp 96.5°F

## 2019-06-21 DIAGNOSIS — L97522 Non-pressure chronic ulcer of other part of left foot with fat layer exposed: Secondary | ICD-10-CM | POA: Diagnosis not present

## 2019-06-21 DIAGNOSIS — M86672 Other chronic osteomyelitis, left ankle and foot: Secondary | ICD-10-CM

## 2019-06-21 DIAGNOSIS — M869 Osteomyelitis, unspecified: Secondary | ICD-10-CM

## 2019-06-21 NOTE — Telephone Encounter (Addendum)
Reviewed patient's labs and spoke with Dr. Megan Salon. 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  She had some low potassium numbers last week, but they were repeated and are in normal range now.  Her SCr and vancomycin trough have been problematic. See lab results below:  3/15: SCr 0.78; VT 11.3 3/18: SCr 1.08; VT 13.2 3/22: SCr 2.03; VT 25.2 3/25: SCr 1.97; VT 28.9 3/29: SCr 2.25; VT 12.3.  [facility] is wondering what they should do about restarting the vancomycin as they have been holding since 3/23.  I called and spoke to [place] at [facility] and asked her to run daptomycin through the patient's insurance to see if it was affordable. I had her run 8 mg/kg/day at 800 mg daily. She is on the verge of needing q48h dosing but can get daily as of today's labs. Will await Mary's call to see what the cost is. She is on IV antibiotics until 4/15.  UPDATE: Daptomycin out of pocket cost is $0. Will verify with Dr. Megan Salon that it is ok to switch.

## 2019-06-21 NOTE — Telephone Encounter (Signed)
Tammy Good, Pharmacist called office today with concerns regarding patient's labs and antbiotics. States he paged Dr. Baxter Flattery, but has not heard back yet.Tammy Good to reach out to Doctor on call to discuss labs and treatment plan. Tammy Good

## 2019-06-22 MED ORDER — SODIUM CHLORIDE 0.9 % IV SOLN: 800.0000 mg | Freq: Every day | INTRAVENOUS | 10 refills | Status: DC

## 2019-06-22 NOTE — Addendum Note (Signed)
Addended by: Darletta Moll on: 06/22/2019 02:10 PM   Modules accepted: Orders

## 2019-06-22 NOTE — Telephone Encounter (Signed)
Mary with [facility] called to follow up on orders for Dapto.Would like to know if patient will need to be set up for first dose at home. Per Cassie patient will need first dose.  Stanton Kidney will follow up with [facility] to provide first dose at home since [facility] is unable to do so.  [place]

## 2019-06-22 NOTE — Telephone Encounter (Signed)
Spoke with Melissa at [facility] and gave verbal order to stop vancomycin and switch patient to daptomycin 800 mg IV daily. Lab orders given for weekly CPK and to stop vancomycin trough. Continue twice weekly BMET to watch kidney function. She may need q48h dosing if her renal function keeps declining. Melissa repeated the orders and verbalized understanding.

## 2019-06-22 NOTE — Telephone Encounter (Signed)
I agree with switching to daptomycin.

## 2019-06-22 NOTE — Progress Notes (Signed)
Subjective: Tammy Good is a 69 y.o. 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  For that she is doing well and home health has been changing the bandage. She states they have stopped the kidney function.  She denies any increase in swelling or pain to the foot.  She has no other concerns today. Denies any fevers, chills, nausea, vomiting.  No calf pain, chest pain or shortness of breath.  Objective: General: No acute distress, AAOx3  DP/PT pulses palpable LEFT foot: Incision is well coapted without any evidence of dehiscence distal aspect.  The wound base is granular small meta fibrotic tissue.  After debridement the wound measures 3.5 x 1 cm and superficial with a granular wound base.  There is no probing, no minor tunneling.  No exposed bone or tendon. No other areas of tenderness to bilateral lower extremities.  No other open lesions or pre-ulcerative lesions.  No pain with calf compression, swelling, warmth, erythema.         Assessment and Plan:  Status post left foot surgery, doing well with no complications   -Treatment options discussed including all alternatives, risks, and complications -I reviewed the sutures today.  I sharply debrided the wound thousand 312 with scalpel down to healthy, bleeding, viable tissue.  Already continue with saline wet-to-dry dressing for now.  Continue offloading shoe.  Monitoring signs or symptoms of infection.  Return in about 1 week (around 06/28/2019) for wound check.  Trula Slade DPM -

## 2019-06-22 NOTE — Telephone Encounter (Signed)
Tammy Good left voicemail with update on patient's first dose of dapto. Tammy Good will see patient tomorrow afternoon to do first dose and teaching. Will also do labs for Thursday. Tammy Good will continue to follow patient. [place]

## 2019-06-23 ENCOUNTER — Other Ambulatory Visit (HOSPITAL_COMMUNITY)
Admission: RE | Admit: 2019-06-23 | Discharge: 2019-06-23 | Disposition: A | Discharge status: Home / Self Care | Payer: BC Managed Care – PPO | Source: Other Acute Inpatient Hospital | Attending: Internal Medicine | Admitting: Internal Medicine

## 2019-06-23 DIAGNOSIS — M869 Osteomyelitis, unspecified: Secondary | ICD-10-CM | POA: Diagnosis present

## 2019-06-23 DIAGNOSIS — L03119 Cellulitis of unspecified part of limb: Secondary | ICD-10-CM | POA: Insufficient documentation

## 2019-06-23 LAB — BASIC METABOLIC PANEL
Anion gap: 10 (ref 5–15)
BUN: 40 mg/dL — ABNORMAL HIGH (ref 8–23)
CO2: 23 mmol/L (ref 22–32)
Calcium: 8.9 mg/dL (ref 8.9–10.3)
Chloride: 104 mmol/L (ref 98–111)
Creatinine, Ser: 2.66 mg/dL — ABNORMAL HIGH (ref 0.44–1.00)
GFR calc Af Amer: 21 mL/min — ABNORMAL LOW (ref >60)
GFR calc non Af Amer: 18 mL/min — ABNORMAL LOW (ref >60)
Glucose, Bld: 218 mg/dL — ABNORMAL HIGH (ref 70–99)
Potassium: 4.5 mmol/L (ref 3.5–5.1)
Sodium: 137 mmol/L (ref 135–145)

## 2019-06-23 NOTE — Progress Notes (Signed)
Cardiology Office Note   Date:  06/27/2019   ID:  Tammy Good, DOB 21-Apr-1950, MRN [id number]  PCP:  [name], MD  Cardiologist: Dr.Harding  CC: Post hospital follow up   History of Present Illness: Tammy Good is a 69 y.o. female who presents for ongoing assessment and management of CAD with hx of STEMI and PCI to the RCA in June of 2015, using Integrity Resolute DES.  Other history includes type 2 diabetes, hyperlipidemia, hypothyroidism, obesity, and and pan hypopituitarism.   On last office with Dr. Ellyn Hack dated 12/22/2018 the patient's blood pressure was not well controlled on lisinopril and metoprolol.  HCTZ was added to her regimen  25 mg daily.  She was advised to work more strictly on low-sodium diet and to lose weight.  She was to continue aspirin 81 mg daily but was to also be taking Plavix.  Since being seen last the patient had ABIs which were indicative of a infrainguinal occlusive disease.  She was hospitalized for left foot wound status post left fifth ray amputation in the setting of depressed ABIs and PAD.  She had been treated unsuccessfully as an outpatient with antibiotics, for osteomyelitis.  She eventually underwent fifth ray amputation by Dr. Earleen Newport..  She was to continue vancomycin until July 07, 2019  Left ABI 0.64 with TBI of 0.35 indicative of infrainguinal occlusive disease.  On 05/26/2019 the patient had an aortogram with left lower extremity runoff and possible intervention.  The had left FSA revascularization with mechanical orbital atherectomy, left FSA angioplasty including drug-coated balloon, left FSA proximal stent placement for focal dissection by Dr.Brabham.  She was to continue aspirin Plavix and statin.  She comes today very tired and worn out.  She has no appetite.  She does have some dyspnea on exertion.  She states that she is not hungry but she is trying to force fluids.  She has had no complaints of chest pain.  She does not have any  dizziness but she is very inactive and uses a cane and a wheelchair for ambulation.  Past Medical History:  Diagnosis Date  . CAD S/P percutaneous coronary angioplasty 08/2013   100% mRCA - PCI Integrity Resolute DES 3.0 mm x 38 mm - 3.35 mm; PTCA of RPA V 2.0 mm x 15 mm  . Cholesteatoma    right  . Diabetes mellitus type 2 in obese (HCC)    On insulin and Invokana  . Hyperlipidemia with target LDL less than 70   . Hypothyroidism (acquired)   . Mild essential hypertension   . Obesity (BMI 30-39.9) 11/17/2013  . Panhypopituitarism ([place])   . ST elevation myocardial infarction (STEMI) of inferior wall, subsequent episode of care ([facility]) 08/2013   80% branch of D1, 40% mid AV groove circumflex, 100% RCA with subacute thrombus -- thrombus extending into RPA V with 100% occlusion after initial angioplasty of mid RCA ;; Post MI ECHO 6/9/'15: EF 50-55%, mild LVH with moderate HK of inferior wall, Gr1 DD, mild LA dilation; mildly reduced RV function  . Status post insertion of drug eluting coronary artery stent to RCA emergently 09/16/2013    Past Surgical History:  Procedure Laterality Date  . ABDOMINAL AORTOGRAM W/LOWER EXTREMITY N/A 05/26/2019   Procedure: ABDOMINAL AORTOGRAM W/LOWER EXTREMITY;  Surgeon: Marty Heck, MD;  Location: [place] [facility];  Service: Cardiovascular;  Laterality: N/A;  . AMPUTATION Left 05/25/2019   Procedure: AMPUTATION RAY 5th;  Surgeon: Trula Slade, DPM;  Location:  [place] OR;  Service: Podiatry;  Laterality: Left;  . Cardiac Event Monitor  July-August 2015   Sinus rhythm with PVCs  . COLONOSCOPY N/A 08/31/2013   Procedure: COLONOSCOPY;  Surgeon: Juanita Craver, MD;  Location: [facility] ENDOSCOPY;  Service: Endoscopy;  Laterality: N/A;  . ESOPHAGOGASTRODUODENOSCOPY N/A 09/01/2013   Procedure: ESOPHAGOGASTRODUODENOSCOPY (EGD);  Surgeon: Beryle Beams, MD;  Location: [facility] ENDOSCOPY;  Service: Endoscopy;  Laterality: N/A;  bedside  . LEFT HEART CATHETERIZATION WITH CORONARY  ANGIOGRAM N/A 08/30/2013   Procedure: LEFT HEART CATHETERIZATION WITH CORONARY ANGIOGRAM;  Surgeon: Leonie Man, MD;  Location: [facility] CATH LAB: 100% mRCA (thrombus - extends to RPAV), 80% D1, 40% AVG Cx.  [name] PERCUTANEOUS CORONARY STENT INTERVENTION (PCI-S)  08/30/2013   Procedure: PERCUTANEOUS CORONARY STENT INTERVENTION (PCI-S);  Surgeon: Leonie Man, MD;  Location: [facility] CATH LAB;  Integrity Resolute DES 2.0 mm x 38 mm -- 3.35 mm.; PTCA of proximal RPA V. - 2.0 mm x 15 mm balloon  . PERIPHERAL VASCULAR ATHERECTOMY  05/26/2019   Procedure: PERIPHERAL VASCULAR ATHERECTOMY;  Surgeon: Marty Heck, MD;  Location: [place] CV LAB;  Service: Cardiovascular;;  Left SFA  . PERIPHERAL VASCULAR INTERVENTION  05/26/2019   Procedure: PERIPHERAL VASCULAR INTERVENTION;  Surgeon: Marty Heck, MD;  Location: [place] CV LAB;  Service: Cardiovascular;;  Left SFA  . PITUITARY SURGERY    . TRANSTHORACIC ECHOCARDIOGRAM  08/30/2013   mild LVH. EF 50-55%. Moderate HK of the entire inferior myocardium. GR 1 DD. Mild LA dilation. Mildly reduced RV function  . TYMPANOMASTOIDECTOMY Right 12/28/2017   Procedure: RIGHT TYMPANOMASTOIDECTOMY;  Surgeon: Leta Baptist, MD;  Location: [place];  Service: ENT;  Laterality: Right;     Current Outpatient Medications  Medication Sig Dispense Refill  . aspirin EC 81 MG tablet Take 81 mg by mouth daily.    [name] atorvastatin (LIPITOR) 40 MG tablet Take 1 tablet (40 mg total) by mouth daily at 6 PM. 30 tablet 0  . clopidogrel (PLAVIX) 75 MG tablet Take 1 tablet (75 mg total) by mouth daily with breakfast. 30 tablet 1  . DAPTOmycin 800 mg in sodium chloride 0.9 % 100 mL Inject 800 mg into the vein daily at 8 pm. 1 vial 10  . dexamethasone (DECADRON) 0.5 MG tablet Take 0.5 mg by mouth daily.     [name] FARXIGA 10 MG TABS tablet Take 10 mg by mouth daily.     . ferrous sulfate 325 (65 FE) MG tablet Take 325 mg by mouth daily with breakfast.    . Insulin Glargine  (BASAGLAR KWIKPEN) 100 UNIT/ML SOPN Inject 52 Units into the skin at bedtime. inject 52 units into skin at bedtime    . levothyroxine (SYNTHROID, LEVOTHROID) 175 MCG tablet Take 175 mcg by mouth daily before breakfast.    . lisinopril (ZESTRIL) 10 MG tablet Take 10 mg by mouth daily.    . metoprolol tartrate (LOPRESSOR) 25 MG tablet TAKE 1 TABLET TWICE A DAY 180 tablet 2  . NOVOLOG FLEXPEN 100 UNIT/ML FlexPen Inject 5-14 Units into the skin 3 (three) times daily with meals. Per sliding scale    . Omega-3 Fatty Acids (FISH OIL) 1000 MG CAPS Take 1,000 mg by mouth daily.    Glory Rosebush VERIO test strip USE 1 STRIP TO CHECK GLUCOSE THREE TIMES DAILY  1  . pantoprazole (PROTONIX) 40 MG tablet Take 1 tablet (40 mg total) by mouth 2 (two) times daily before a meal. 60 tablet 0  .  potassium chloride SA (KLOR-CON) 20 MEQ tablet daily.    [name] sulfamethoxazole-trimethoprim (BACTRIM) 400-80 MG tablet Take 1 tablet by mouth 2 (two) times daily. 20 tablet 0   No current facility-administered medications for this visit.    Allergies:   Strawberry extract    Social History:  The patient  reports that she has quit smoking. She has never used smokeless tobacco. She reports that she does not drink alcohol or use drugs.   Family History:  The patient's family history includes Alzheimer's disease in her maternal grandmother; Cancer in her sister; Cancer (age of onset: 72) in her mother; Heart attack (age of onset: 11) in her father.    ROS: All other systems are reviewed and negative. Unless otherwise mentioned in H&P    PHYSICAL EXAM: VS:  BP (!) 88/56   Pulse 85   Ht 5\' 7"  (1.702 m)   Wt 210 lb 12.8 oz (95.6 kg)   SpO2 100%   BMI 33.02 kg/m  , BMI Body mass index is 33.02 kg/m. GEN: Well nourished, well developed, in no acute distress HEENT: normal Neck: no JVD, carotid bruits, or masses Cardiac: RRR, tachycardic; no murmurs, rubs, or gallops,no edema  Respiratory:  Clear to auscultation  bilaterally, normal work of breathing GI: soft, nontender, nondistended, + BS MS: no deformity or atrophy.  She has her left foot wrapped, also IV, PICC line, right upper arm. Skin: warm and dry, no rash Neuro:  Strength and sensation are intact Psych: euthymic mood, flat affect   EKG: Normal sinus rhythm ventricular rate of 85 bpm inferior anterior lateral ST changes indicative of prior infarct   Recent Labs: 05/23/2019: ALT 16 05/30/2019: Hemoglobin 9.9; Magnesium 1.6; Platelets 471 06/23/2019: BUN 40; Creatinine, Ser 2.66; Potassium 4.5; Sodium 137    Lipid Panel No results found for: CHOL, TRIG, HDL, CHOLHDL, VLDL, LDLCALC, LDLDIRECT    Wt Readings from Last 3 Encounters:  06/27/19 210 lb 12.8 oz (95.6 kg)  05/25/19 224 lb 3.3 oz (101.7 kg)  01/03/19 242 lb (109.8 kg)      Other studies Reviewed:\  Aortogram with runoff 05/26/2019 Pre-operative Diagnosis:Left foot wound status post left fifth ray amputation in the setting of depressed ABIs and underlying peripheral arterial disease Post-operative diagnosis:Same Surgeon:Christopher Addison Naegeli, MD Procedure Performed: 1.Ultrasound-guided access of the right common femoral artery 2.Aortogram 3.Bilateral lower extremity arteriogram with runoff including selection of third order branches in the left lower extremity 4.Left SFArevascularization withmechanicalorbitalatherectomy (1.5 CSI solid) 5.Left SFA angioplasty including drug-coated balloon (5 mm x 150 mm Sterling and 5 mm x 200 mm drug-coated Ranger) 6.Left SFA proximal stent placement for focal dissection (coronary artery disease: Mm x 40 mm drug-coatedEluvia) 7.Mynx closure of the right common femoral artery 8.92 minutes of monitored moderate conscious sedation time   ASSESSMENT AND PLAN:  1.  CAD: Status post PCI to the RCA 2015 in the setting of STEMI.  She continues on statin, Plavix, and ACE.  She offers no complaints of recurrent chest pain.   She does have some mild dyspnea on exertion.  I believe that this was related to deconditioning.  2.  Hypotension: I have reviewed her recent labs, creatinine is elevated at 2.66.  I am going to discontinue her HCTZ and decrease lisinopril to 10 mg daily from 20 mg daily.  She is not to take either of those medications today, and of course stop HCTZ altogether.  She is due to have labs drawn tomorrow.  She gets labs drawn  on Mondays and Thursdays, as well by ID.  She is due to see her primary care physician Dr. Forde Dandy tomorrow.  I have advised her to let him know about the medication changes.  Follow-up kidney function will need to be drawn.  I believe this is being done consistently as she is on vancomycin.  3.  Status post left FSA angioplasty, and left FSA proximal stent placement.  Completed on 05/26/2019 by Dr. Trula Slade.  Has follow-up Doppler studies ordered for July 01, 2019 through his office as well as ABIs.  4.  Cellulitis: Being followed by ID on vancomycin via PICC line.  Current medicines are reviewed at length with the patient today.  I have spent 25 minutes  dedicated to the care of this patient on the date of this encounter to include pre-visit review of records, assessment, management and diagnostic testing,with shared decision making.  Labs/ tests ordered today include: None  Phill Myron. West Pugh, ANP, AACC   06/27/2019 11:30 AM    [facility] [address] Office [phone] Fax [phone]  Notice: This dictation was prepared with Dragon dictation along with smaller phrase technology. Any transcriptional errors that result from this process are unintentional and may not be corrected upon review.

## 2019-06-24 LAB — FUNGUS CULTURE RESULT

## 2019-06-24 LAB — FUNGUS CULTURE WITH STAIN

## 2019-06-24 LAB — FUNGAL ORGANISM REFLEX

## 2019-06-27 ENCOUNTER — Ambulatory Visit: Payer: BC Managed Care – PPO | Admitting: Adult Health

## 2019-06-27 ENCOUNTER — Other Ambulatory Visit: Payer: Self-pay

## 2019-06-27 ENCOUNTER — Encounter: Payer: Self-pay | Admitting: Adult Health

## 2019-06-27 VITALS — BP 88/56 | HR 85 | Ht 67.0 in | Wt 210.8 lb

## 2019-06-27 DIAGNOSIS — Z9861 Coronary angioplasty status: Secondary | ICD-10-CM

## 2019-06-27 DIAGNOSIS — M86372 Chronic multifocal osteomyelitis, left ankle and foot: Secondary | ICD-10-CM | POA: Diagnosis not present

## 2019-06-27 DIAGNOSIS — I952 Hypotension due to drugs: Secondary | ICD-10-CM | POA: Diagnosis not present

## 2019-06-27 DIAGNOSIS — I2510 Atherosclerotic heart disease of native coronary artery without angina pectoris: Secondary | ICD-10-CM

## 2019-06-27 DIAGNOSIS — I739 Peripheral vascular disease, unspecified: Secondary | ICD-10-CM | POA: Diagnosis not present

## 2019-06-27 NOTE — Patient Instructions (Signed)
Medication Instructions:  STOP- Hydrochlorothiazide DECREASE- Lisinopril 10 mg by mouth daily  *If you need a refill on your cardiac medications before your next appointment, please call your pharmacy*   Lab Work: None Ordered If you have labs (blood work) drawn today and your tests are completely normal, you will receive your results only by: [name] MyChart Message (if you have MyChart) OR . A paper copy in the mail If you have any lab test that is abnormal or we need to change your treatment, we will call you to review the results.   Testing/Procedures: None Ordered   Follow-Up: At [facility], you and your health needs are our priority.  As part of our continuing mission to provide you with exceptional heart care, we have created designated Provider Care Teams.  These Care Teams include your primary Cardiologist (physician) and Advanced Practice Providers (APPs -  Physician Assistants and Nurse Practitioners) who all work together to provide you with the care you need, when you need it.  We recommend signing up for the patient portal called "MyChart".  Sign up information is provided on this After Visit Summary.  MyChart is used to connect with patients for Virtual Visits (Telemedicine).  Patients are able to view lab/test results, encounter notes, upcoming appointments, etc.  Non-urgent messages can be sent to your provider as well.   To learn more about what you can do with MyChart, go to [email or web address].    Your next appointment:   Wednesday May 5th @ 9:45 am  The format for your next appointment:   In Person  Provider:   Jory Sims, DNP, ANP

## 2019-06-28 DIAGNOSIS — Z89429 Acquired absence of other toe(s), unspecified side: Secondary | ICD-10-CM | POA: Insufficient documentation

## 2019-06-28 DIAGNOSIS — D352 Benign neoplasm of pituitary gland: Secondary | ICD-10-CM | POA: Insufficient documentation

## 2019-06-29 ENCOUNTER — Other Ambulatory Visit: Payer: Self-pay | Admitting: *Deleted

## 2019-06-29 DIAGNOSIS — L03119 Cellulitis of unspecified part of limb: Secondary | ICD-10-CM

## 2019-06-29 DIAGNOSIS — L02619 Cutaneous abscess of unspecified foot: Secondary | ICD-10-CM

## 2019-06-29 DIAGNOSIS — L02612 Cutaneous abscess of left foot: Secondary | ICD-10-CM

## 2019-06-29 DIAGNOSIS — L03032 Cellulitis of left toe: Secondary | ICD-10-CM

## 2019-06-29 DIAGNOSIS — M79651 Pain in right thigh: Secondary | ICD-10-CM

## 2019-06-30 ENCOUNTER — Other Ambulatory Visit: Payer: Self-pay

## 2019-06-30 ENCOUNTER — Encounter: Payer: Self-pay | Admitting: Podiatry

## 2019-06-30 ENCOUNTER — Ambulatory Visit (INDEPENDENT_AMBULATORY_CARE_PROVIDER_SITE_OTHER): Payer: BC Managed Care – PPO | Admitting: Podiatry

## 2019-06-30 DIAGNOSIS — L97522 Non-pressure chronic ulcer of other part of left foot with fat layer exposed: Secondary | ICD-10-CM

## 2019-07-01 ENCOUNTER — Ambulatory Visit (HOSPITAL_COMMUNITY)
Admission: RE | Admit: 2019-07-01 | Discharge: 2019-07-01 | Disposition: A | Discharge status: Home / Self Care | Payer: BC Managed Care – PPO | Source: Ambulatory Visit | Attending: Vascular Surgery | Admitting: Vascular Surgery

## 2019-07-01 ENCOUNTER — Ambulatory Visit (INDEPENDENT_AMBULATORY_CARE_PROVIDER_SITE_OTHER)
Admit: 2019-07-01 | Discharge: 2019-07-01 | Disposition: A | Discharge status: Home / Self Care | Payer: BC Managed Care – PPO | Attending: Vascular Surgery | Admitting: Vascular Surgery

## 2019-07-01 DIAGNOSIS — M79651 Pain in right thigh: Secondary | ICD-10-CM | POA: Diagnosis present

## 2019-07-01 DIAGNOSIS — L02619 Cutaneous abscess of unspecified foot: Secondary | ICD-10-CM

## 2019-07-01 DIAGNOSIS — L03119 Cellulitis of unspecified part of limb: Secondary | ICD-10-CM | POA: Diagnosis present

## 2019-07-01 DIAGNOSIS — L02612 Cutaneous abscess of left foot: Secondary | ICD-10-CM | POA: Insufficient documentation

## 2019-07-01 DIAGNOSIS — L03032 Cellulitis of left toe: Secondary | ICD-10-CM

## 2019-07-05 ENCOUNTER — Encounter: Payer: Self-pay | Admitting: Vascular Surgery

## 2019-07-05 ENCOUNTER — Other Ambulatory Visit: Payer: Self-pay

## 2019-07-05 ENCOUNTER — Ambulatory Visit (INDEPENDENT_AMBULATORY_CARE_PROVIDER_SITE_OTHER): Payer: BC Managed Care – PPO | Admitting: Vascular Surgery

## 2019-07-05 DIAGNOSIS — I739 Peripheral vascular disease, unspecified: Secondary | ICD-10-CM | POA: Diagnosis not present

## 2019-07-05 NOTE — Progress Notes (Signed)
Patient name: Tammy Good MRN: [id number] DOB: [birth year] Sex: female  REASON FOR VISIT: Follow-up status post left lower extremity intervention for critical limb ischemia with tissue loss  HPI: Tammy Good is a 69 y.o. female that presents for follow-up after left lower extremity intervention for a left fifth ray amputationthat was nonhealing.  On 05/26/2019 she underwent left SFA atherectomy with angioplasty including drug-coated balloon and ultimately required a proximal left SFA stent with a 6 x 40 Luttonix for a focal dissection.  She had a patent popliteal artery with three-vessel runoff in the left lower extremity.  She saw Dr. Earleen Newport with podiatry last week and states that the toe wound is nearly completely healed.  She states there were no concerns from his standpoint.  Foot feels good.  Home health is coming out and doing dressing changes.  She has had no issues with the right leg which is the opposite side.  Past Medical History:  Diagnosis Good  . CAD S/P percutaneous coronary angioplasty 08/2013   100% mRCA - PCI Integrity Resolute DES 3.0 mm x 38 mm - 3.35 mm; PTCA of RPA V 2.0 mm x 15 mm  . Cholesteatoma    right  . Diabetes mellitus type 2 in obese (HCC)    On insulin and Invokana  . Hyperlipidemia with target LDL less than 70   . Hypothyroidism (acquired)   . Mild essential hypertension   . Obesity (BMI 30-39.9) 11/17/2013  . Panhypopituitarism ([place])   . ST elevation myocardial infarction (STEMI) of inferior wall, subsequent episode of care ([place]) 08/2013   80% branch of D1, 40% mid AV groove circumflex, 100% RCA with subacute thrombus -- thrombus extending into RPA V with 100% occlusion after initial angioplasty of mid RCA ;; Post MI ECHO 6/9/'15: EF 50-55%, mild LVH with moderate HK of inferior wall, Gr1 DD, mild LA dilation; mildly reduced RV function  . Status post insertion of drug eluting coronary artery stent to RCA emergently 09/16/2013    Past Surgical  History:  Procedure Laterality Good  . ABDOMINAL AORTOGRAM W/LOWER EXTREMITY N/A 05/26/2019   Procedure: ABDOMINAL AORTOGRAM W/LOWER EXTREMITY;  Surgeon: Marty Heck, MD;  Location: [place] CV LAB;  Service: Cardiovascular;  Laterality: N/A;  . AMPUTATION Left 05/25/2019   Procedure: AMPUTATION RAY 5th;  Surgeon: Trula Slade, DPM;  Location: [place];  Service: Podiatry;  Laterality: Left;  . Cardiac Event Monitor  July-August 2015   Sinus rhythm with PVCs  . COLONOSCOPY N/A 08/31/2013   Procedure: COLONOSCOPY;  Surgeon: Juanita Craver, MD;  Location: [facility] ENDOSCOPY;  Service: Endoscopy;  Laterality: N/A;  . ESOPHAGOGASTRODUODENOSCOPY N/A 09/01/2013   Procedure: ESOPHAGOGASTRODUODENOSCOPY (EGD);  Surgeon: Beryle Beams, MD;  Location: [facility] ENDOSCOPY;  Service: Endoscopy;  Laterality: N/A;  bedside  . LEFT HEART CATHETERIZATION WITH CORONARY ANGIOGRAM N/A 08/30/2013   Procedure: LEFT HEART CATHETERIZATION WITH CORONARY ANGIOGRAM;  Surgeon: Leonie Man, MD;  Location: [facility] CATH LAB: 100% mRCA (thrombus - extends to RPAV), 80% D1, 40% AVG Cx.  [name] PERCUTANEOUS CORONARY STENT INTERVENTION (PCI-S)  08/30/2013   Procedure: PERCUTANEOUS CORONARY STENT INTERVENTION (PCI-S);  Surgeon: Leonie Man, MD;  Location: [facility] CATH LAB;  Integrity Resolute DES 2.0 mm x 38 mm -- 3.35 mm.; PTCA of proximal RPA V. - 2.0 mm x 15 mm balloon  . PERIPHERAL VASCULAR ATHERECTOMY  05/26/2019   Procedure: PERIPHERAL VASCULAR ATHERECTOMY;  Surgeon: Marty Heck, MD;  Location: [place] CV LAB;  Service: Cardiovascular;;  Left SFA  . PERIPHERAL VASCULAR INTERVENTION  05/26/2019   Procedure: PERIPHERAL VASCULAR INTERVENTION;  Surgeon: Marty Heck, MD;  Location: [place] CV LAB;  Service: Cardiovascular;;  Left SFA  . PITUITARY SURGERY    . TRANSTHORACIC ECHOCARDIOGRAM  08/30/2013   mild LVH. EF 50-55%. Moderate HK of the entire inferior myocardium. GR 1 DD. Mild LA dilation. Mildly reduced RV function    . TYMPANOMASTOIDECTOMY Right 12/28/2017   Procedure: RIGHT TYMPANOMASTOIDECTOMY;  Surgeon: Leta Baptist, MD;  Location: [place];  Service: ENT;  Laterality: Right;    Family History  Problem Relation Age of Onset  . Cancer Mother 5       multiple myeloma  . Heart attack Father 73  . Cancer Sister   . Alzheimer's disease Maternal Grandmother     SOCIAL HISTORY: Social History   Tobacco Use  . Smoking status: Former Research scientist (life sciences)  . Smokeless tobacco: Never Used  Substance Use Topics  . Alcohol use: No    Allergies  Allergen Reactions  . Strawberry Extract Itching, Swelling and Anaphylaxis    Mouth swells and gets itchy    Current Outpatient Medications  Medication Sig Dispense Refill  . aspirin EC 81 MG tablet Take 81 mg by mouth daily.    [name] atorvastatin (LIPITOR) 40 MG tablet Take 1 tablet (40 mg total) by mouth daily at 6 PM. 30 tablet 0  . clopidogrel (PLAVIX) 75 MG tablet Take 1 tablet (75 mg total) by mouth daily with breakfast. 30 tablet 1  . DAPTOmycin 800 mg in sodium chloride 0.9 % 100 mL Inject 800 mg into the vein daily at 8 pm. 1 vial 10  . dexamethasone (DECADRON) 0.5 MG tablet Take 0.5 mg by mouth daily.     [name] FARXIGA 10 MG TABS tablet Take 10 mg by mouth daily.     . ferrous sulfate 325 (65 FE) MG tablet Take 325 mg by mouth daily with breakfast.    . Insulin Glargine (BASAGLAR KWIKPEN) 100 UNIT/ML SOPN Inject 52 Units into the skin at bedtime. inject 52 units into skin at bedtime    . levothyroxine (SYNTHROID, LEVOTHROID) 175 MCG tablet Take 175 mcg by mouth daily before breakfast.    . lisinopril (ZESTRIL) 10 MG tablet Take 10 mg by mouth daily.    . metoprolol tartrate (LOPRESSOR) 25 MG tablet TAKE 1 TABLET TWICE A DAY 180 tablet 2  . NOVOLOG FLEXPEN 100 UNIT/ML FlexPen Inject 5-14 Units into the skin 3 (three) times daily with meals. Per sliding scale    . Omega-3 Fatty Acids (FISH OIL) 1000 MG CAPS Take 1,000 mg by mouth daily.    Glory Rosebush  VERIO test strip USE 1 STRIP TO CHECK GLUCOSE THREE TIMES DAILY  1  . pantoprazole (PROTONIX) 40 MG tablet Take 1 tablet (40 mg total) by mouth 2 (two) times daily before a meal. 60 tablet 0  . potassium chloride SA (KLOR-CON) 20 MEQ tablet daily.    [name] sulfamethoxazole-trimethoprim (BACTRIM) 400-80 MG tablet Take 1 tablet by mouth 2 (two) times daily. 20 tablet 0   No current facility-administered medications for this visit.    REVIEW OF SYSTEMS:  '[X]'  denotes positive finding, '[ ]'  denotes negative finding Cardiac  Comments:  Chest pain or chest pressure:    Shortness of breath upon exertion:    Short of breath when lying flat:    Irregular heart rhythm:        Vascular  Pain in calf, thigh, or hip brought on by ambulation:    Pain in feet at night that wakes you up from your sleep:     Blood clot in your veins:    Leg swelling:         Pulmonary    Oxygen at home:    Productive cough:     Wheezing:         Neurologic    Sudden weakness in arms or legs:     Sudden numbness in arms or legs:     Sudden onset of difficulty speaking or slurred speech:    Temporary loss of vision in one eye:     Problems with dizziness:         Gastrointestinal    Blood in stool:     Vomited blood:         Genitourinary    Burning when urinating:     Blood in urine:        Psychiatric    Major depression:         Hematologic    Bleeding problems:    Problems with blood clotting too easily:        Skin    Rashes or ulcers:        Constitutional    Fever or chills:      PHYSICAL EXAM: Vitals:   07/05/19 0815  BP: 126/66  Pulse: 70  Resp: 16  Temp: (!) 95.5 F (35.3 C)  TempSrc: Temporal  SpO2: 98%  Weight: 208 lb (94.3 kg)  Height: '5\' 7"'  (1.702 m)    GENERAL: The patient is a well-nourished female, in no acute distress. The vital signs are documented above. CARDIAC: There is a regular rate and rhythm.  VASCULAR:  palpable femoral pulses bilaterally, no right groin  hematoma etc. Left 5th toe ray amp nearly completely healed   DATA:   Individually reviewed left lower extremity arterial duplex and left SFA stent is widely patent, there is a moderate 50 to 74% stenosis in the mid left SFA with a velocity of 274.  ABIs on the left are now 0.78 and biphasic.  Assessment/Plan:  69 year old female status post left lower extremity intervention for critical limb ischemia with tissue loss in the setting of nonhealing left fifth toe ray amp.  Fortunately this toe amputation is now healing and very pleased with her progress.  Left SFA intervention remains patent including the left SFA stent on duplex today.  There is a moderate stenosis in the mid left SFA but no role for intervention given this is not high-grade and her wound is healing with good progress.  I will have her follow-up in 3 months with repeat ABIs and left lower extremity arterial duplex for ongoing surveillance and discussed that she let our office know if there are any issues with wound healing between now and her next follow-up. Continue aspirin, statin, plavix, etc.   Marty Heck, MD [facility] Office: [phone]

## 2019-07-06 ENCOUNTER — Encounter: Payer: Self-pay | Admitting: Internal Medicine

## 2019-07-06 ENCOUNTER — Other Ambulatory Visit: Payer: Self-pay | Admitting: *Deleted

## 2019-07-06 ENCOUNTER — Ambulatory Visit: Payer: BC Managed Care – PPO | Admitting: Internal Medicine

## 2019-07-06 VITALS — BP 125/73 | HR 81 | Temp 98.2°F | Wt 212.0 lb

## 2019-07-06 DIAGNOSIS — I739 Peripheral vascular disease, unspecified: Secondary | ICD-10-CM

## 2019-07-06 DIAGNOSIS — M86672 Other chronic osteomyelitis, left ankle and foot: Secondary | ICD-10-CM | POA: Diagnosis not present

## 2019-07-06 DIAGNOSIS — N179 Acute kidney failure, unspecified: Secondary | ICD-10-CM

## 2019-07-06 DIAGNOSIS — T50905A Adverse effect of unspecified drugs, medicaments and biological substances, initial encounter: Secondary | ICD-10-CM | POA: Diagnosis not present

## 2019-07-06 MED ORDER — DOXYCYCLINE HYCLATE 100 MG PO TABS: 100.0000 mg | ORAL_TABLET | Freq: Two times a day (BID) | ORAL | 0 refills | Status: DC

## 2019-07-06 MED ORDER — ONDANSETRON HCL 4 MG PO TABS: 4.0000 mg | ORAL_TABLET | Freq: Three times a day (TID) | ORAL | 1 refills | Status: DC | PRN

## 2019-07-06 NOTE — Progress Notes (Signed)
RFV: DFU/osteo  Patient ID: Tammy Good, female   DOB: 05-19-1950, 69 y.o.   MRN: [id number]  HPI  Feeling better. Foot improved.treated for MRSA diabetic foot osteo c/b PAD - finished 6 wk of treatment. She developed AKI while on vancomycin thus had been switched to dapto for the last 2 weeks.  UOP not dark, going frequent. No pain.   On 05/26/2019 she underwent left SFA atherectomy with angioplasty including drug-coated balloon and ultimately required a proximal left SFA stent with a 6 x 40 Luttonix for a focal dissection.  She had a patent popliteal artery with three-vessel runoff in the left lower extremity. Outpatient Encounter Medications as of 07/06/2019  Medication Sig  . aspirin EC 81 MG tablet Take 81 mg by mouth daily.  [name] atorvastatin (LIPITOR) 40 MG tablet Take 1 tablet (40 mg total) by mouth daily at 6 PM.  . clopidogrel (PLAVIX) 75 MG tablet Take 1 tablet (75 mg total) by mouth daily with breakfast.  . DAPTOmycin 800 mg in sodium chloride 0.9 % 100 mL Inject 800 mg into the vein daily at 8 pm.  . dexamethasone (DECADRON) 0.5 MG tablet Take 0.5 mg by mouth daily.   [name] FARXIGA 10 MG TABS tablet Take 10 mg by mouth daily.   . ferrous sulfate 325 (65 FE) MG tablet Take 325 mg by mouth daily with breakfast.  . Insulin Glargine (BASAGLAR KWIKPEN) 100 UNIT/ML SOPN Inject 52 Units into the skin at bedtime. inject 52 units into skin at bedtime  . levothyroxine (SYNTHROID, LEVOTHROID) 175 MCG tablet Take 175 mcg by mouth daily before breakfast.  . lisinopril (ZESTRIL) 10 MG tablet Take 10 mg by mouth daily.  . metoprolol tartrate (LOPRESSOR) 25 MG tablet TAKE 1 TABLET TWICE A DAY  . NOVOLOG FLEXPEN 100 UNIT/ML FlexPen Inject 5-14 Units into the skin 3 (three) times daily with meals. Per sliding scale  . Omega-3 Fatty Acids (FISH OIL) 1000 MG CAPS Take 1,000 mg by mouth daily.  Glory Rosebush VERIO test strip USE 1 STRIP TO CHECK GLUCOSE THREE TIMES DAILY  . pantoprazole (PROTONIX) 40  MG tablet Take 1 tablet (40 mg total) by mouth 2 (two) times daily before a meal.  . potassium chloride SA (KLOR-CON) 20 MEQ tablet daily.  [name] sulfamethoxazole-trimethoprim (BACTRIM) 400-80 MG tablet Take 1 tablet by mouth 2 (two) times daily.   No facility-administered encounter medications on file as of 07/06/2019.     Patient Active Problem List   Diagnosis Date Noted  . PAD (peripheral artery disease) ([place]) 07/05/2019  . Osteomyelitis of left foot ([place]) 05/24/2019  . Osteomyelitis ([place]) 05/23/2019  . Cellulitis and abscess of foot, except toes 05/18/2019  . Chronic osteomyelitis of ankle and foot ([place]) 05/18/2019  . Cellulitis and abscess of toe 11/09/2018  . Pre-operative cardiovascular examination 11/30/2017  . Iron deficiency anemia 12/10/2014  . Obesity (BMI 30-39.9) 11/17/2013  . Diabetes mellitus type 2 in obese ([place])   . Essential hypertension   . Right thigh pain 11/01/2013  . PVC's (premature ventricular contractions) 11/01/2013  . Status post insertion of drug eluting coronary artery stent to [facility] emergently and PTCA to prox. PLA 09/16/2013  . Hyperlipidemia LDL goal <70 09/16/2013  . 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 ST elevation myocardial infarction (STEMI) of inferior wall, subsequent episode of care ([place]) 08/22/2013  . CAD S/P percutaneous coronary angioplasty 08/22/2013     Health Maintenance Due  Topic Date Due  . Hepatitis C Screening  Never  done  . OPHTHALMOLOGY EXAM  Never done  . TETANUS/TDAP  Never done  . MAMMOGRAM  Never done  . HEMOGLOBIN A1C  03/04/2014  . DEXA SCAN  Never done  . PNA vac Low Risk Adult (1 of 2 - PCV13) Never done     Review of Systems Review of Systems  Constitutional: Negative for fever, chills, diaphoresis, activity change, appetite change, fatigue and unexpected weight change.  HENT: Negative for congestion, sore throat, rhinorrhea, sneezing, trouble swallowing and sinus pressure.  Eyes: Negative for photophobia and  visual disturbance.  Respiratory: Negative for cough, chest tightness, shortness of breath, wheezing and stridor.  Cardiovascular: Negative for chest pain, palpitations and leg swelling.  Gastrointestinal: Negative for nausea, vomiting, abdominal pain, diarrhea, constipation, blood in stool, abdominal distention and anal bleeding.  Genitourinary: Negative for dysuria, hematuria, flank pain and difficulty urinating.  Musculoskeletal: Negative for myalgias, back pain, joint swelling, arthralgias and gait problem.  Skin: Negative for color change, pallor, rash and wound.  Neurological: Negative for dizziness, tremors, weakness and light-headedness.  Hematological: Negative for adenopathy. Does not bruise/bleed easily.  Psychiatric/Behavioral: Negative for behavioral problems, confusion, sleep disturbance, dysphoric mood, decreased concentration and agitation.    Physical Exam   BP 125/73   Pulse 81   Temp 98.2 F (36.8 C) (Oral)   Wt 212 lb (96.2 kg)   BMI 33.20 kg/m   gen = a xo by 3 in nad Heent= no signs of thrush Ext; right picc line c/d/i Left lateral MT amputation, healing. Dry skin surrounding heal. No drainage CBC Lab Results  Component Value Date   WBC 9.9 05/30/2019   RBC 3.43 (L) 05/30/2019   HGB 9.9 (L) 05/30/2019   HCT 32.6 (L) 05/30/2019   PLT 471 (H) 05/30/2019   MCV 95.0 05/30/2019   MCH 28.9 05/30/2019   MCHC 30.4 05/30/2019   RDW 15.9 (H) 05/30/2019   LYMPHSABS 1.3 05/30/2019   MONOABS 0.9 05/30/2019   EOSABS 0.2 05/30/2019    BMET Lab Results  Component Value Date   NA 137 06/23/2019   K 4.5 06/23/2019   CL 104 06/23/2019   CO2 23 06/23/2019   GLUCOSE 218 (H) 06/23/2019   BUN 40 (H) 06/23/2019   CREATININE 2.66 (H) 06/23/2019   CALCIUM 8.9 06/23/2019   GFRNONAA 18 (L) 06/23/2019   GFRAA 21 (L) 06/23/2019      Assessment and Plan Diabetic foot ulcer/osteo = Will place on oral doxycycline  Will give zofran to use if needed Gave precautions  about nausea and sun sentivitiy  aki = will need to follow up on cr. 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

## 2019-07-09 LAB — ACID FAST CULTURE WITH REFLEXED SENSITIVITIES (MYCOBACTERIA)
Acid Fast Culture: NEGATIVE
Acid Fast Culture: NEGATIVE

## 2019-07-10 NOTE — Progress Notes (Signed)
Subjective: Tammy Good is a 69 y.o. 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  She states that her home health as well as herself has been changing the bandage.  They been keeping feeling wet-to-dry.  She feels that the wound is healing nicely.  Denies any drainage or pus or any swelling or redness.  She is followed with infectious disease as well as vascular surgery. Denies any fevers, chills, nausea, vomiting.  No calf pain, chest pain or shortness of breath.  Objective: General: No acute distress, AAOx3  DP/PT pulses palpable LEFT foot: Incision is well coapted without any evidence of dehiscence distal aspect.  The wound base is granular small meta fibrotic tissue.  After debridement the wound measures23.5 x 0.5 cm and superficial with a granular wound base.  There is no surrounding erythema, ascending cellulitis there is no fluctuation crepitation there is no malodor. No pain with calf compression, swelling, warmth, erythema.        Assessment and Plan:  Status post left foot surgery, doing well with no complications   -Treatment options discussed including all alternatives, risks, and complications -I did sharply debride the wound today to debrided down to healthy, viable tissue utilizing a #312 with scalpel.  Is able to debride the hyperkeratotic periwound as well as the wound on healthy, granular tissue. -Continue saline wet-to-dry for now.  Continue daily dressing changes.  Elevation. -Continue antibiotics per infectious disease.  Return in about 10 days (around 07/10/2019).  Trula Slade DPM

## 2019-07-12 ENCOUNTER — Ambulatory Visit (INDEPENDENT_AMBULATORY_CARE_PROVIDER_SITE_OTHER): Payer: BC Managed Care – PPO | Admitting: Podiatry

## 2019-07-12 ENCOUNTER — Other Ambulatory Visit: Payer: Self-pay

## 2019-07-12 DIAGNOSIS — M869 Osteomyelitis, unspecified: Secondary | ICD-10-CM

## 2019-07-12 DIAGNOSIS — Z9889 Other specified postprocedural states: Secondary | ICD-10-CM

## 2019-07-13 ENCOUNTER — Telehealth: Payer: Self-pay | Admitting: Podiatry

## 2019-07-13 NOTE — Telephone Encounter (Signed)
Patient said you extended her out of work.

## 2019-07-13 NOTE — Telephone Encounter (Signed)
Can you please do 2 more weeks? Thanks.

## 2019-07-14 ENCOUNTER — Encounter: Payer: Self-pay | Admitting: Podiatry

## 2019-07-14 NOTE — Progress Notes (Signed)
Subjective: Tammy Good is a 69 y.o. 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  She states the wound is doing much better.  She denies any increase in swelling or any redness and she denies any drainage or pus.  She is on doxycycline. Denies any fevers, chills, nausea, vomiting.  No calf pain, chest pain or shortness of breath.  Objective: General: No acute distress, AAOx3  DP/PT pulses palpable LEFT foot: At this time appears to the incision is healed.  There is hyperkeratotic tissue along the incision and another blue debris this.  There is no drainage or pus noted no edema, erythema.  There is no fluctuation or crepitation.  There is no malodor. No pain with calf compression, swelling, warmth, erythema.            Assessment and Plan:  Status post left foot surgery, doing well with no complications   -Treatment options discussed including all alternatives, risks, and complications -Debrided hyperkeratotic tissue to the any complications or bleeding.  Continue moisturizer along the areas daily.  Continue with surgical shoe for offloading for now.  We will likely get her measured for diabetic inserts next appointment.  Encouraged elevation.  Finish course of antibiotics. Monitor for any clinical signs or symptoms of infection and directed to call the office immediately should any occur or go to the ER.  Return in about 2 weeks (around 07/26/2019).  Trula Slade DPM

## 2019-07-20 NOTE — Progress Notes (Signed)
Cardiology Office Note   Date:  07/27/2019   ID:  Tammy Good, DOB 1950/10/03, MRN [id number]  PCP:  [name], MD  Cardiologist:  Dr. Martinique  CC: Follow up   History of Present Illness: Tammy Good is a 69 y.o. female who presents for for ongoing assessment and management of CAD with hx of STEMI and PCI to the RCA in June of 2015, using Integrity Resolute DES.  Other history includes type 2 diabetes, hyperlipidemia, hypothyroidism, obesity, and and pan hypopituitarism.   Since being seen last the patient had ABIs which were indicative of a infrainguinal occlusive disease.  She was hospitalized for left foot wound status post left fifth ray amputation in the setting of depressed ABIs and PAD.  She had been treated unsuccessfully as an outpatient with antibiotics, for osteomyelitis.  She eventually underwent fifth ray amputation by Dr. Earleen Newport..  She was to continue vancomycin until July 07, 2019  Left ABI 0.64 with TBI of 0.35 indicative of infrainguinal occlusive disease.  On 05/26/2019 the patient had an aortogram with left lower extremity runoff and possible intervention.  The had left FSA revascularization with mechanical orbital atherectomy, left FSA angioplasty including drug-coated balloon, left FSA proximal stent placement for focal dissection by Dr.Brabham.  She was to continue aspirin Plavix and statin.  Due to fatigue and hypotension I adjusted her medications. I stopped her HCTZ and decreased lisinopril to 10 mg from 20 mg. She is here for follow up to ascertain her response to medication changes.   She comes today feeling significantly better.  She states her energy is come back, she is able to work around her home without any issues of chest pain or dizziness or fatigue.  She has had her brace removed from her foot and is able to ambulate much better.  She is very happy and energetic today in the office.  Past Medical History:  Diagnosis Date  . CAD S/P percutaneous  coronary angioplasty 08/2013   100% mRCA - PCI Integrity Resolute DES 3.0 mm x 38 mm - 3.35 mm; PTCA of RPA V 2.0 mm x 15 mm  . Cholesteatoma    right  . Diabetes mellitus type 2 in obese (HCC)    On insulin and Invokana  . Hyperlipidemia with target LDL less than 70   . Hypothyroidism (acquired)   . Mild essential hypertension   . Obesity (BMI 30-39.9) 11/17/2013  . Panhypopituitarism ([place])   . ST elevation myocardial infarction (STEMI) of inferior wall, subsequent episode of care ([place]) 08/2013   80% branch of D1, 40% mid AV groove circumflex, 100% RCA with subacute thrombus -- thrombus extending into RPA V with 100% occlusion after initial angioplasty of mid RCA ;; Post MI ECHO 6/9/'15: EF 50-55%, mild LVH with moderate HK of inferior wall, Gr1 DD, mild LA dilation; mildly reduced RV function  . Status post insertion of drug eluting coronary artery stent to RCA emergently 09/16/2013    Past Surgical History:  Procedure Laterality Date  . ABDOMINAL AORTOGRAM W/LOWER EXTREMITY N/A 05/26/2019   Procedure: ABDOMINAL AORTOGRAM W/LOWER EXTREMITY;  Surgeon: Marty Heck, MD;  Location: [facility];  Service: Cardiovascular;  Laterality: N/A;  . AMPUTATION Left 05/25/2019   Procedure: AMPUTATION RAY 5th;  Surgeon: Trula Slade, DPM;  Location: [place];  Service: Podiatry;  Laterality: Left;  . Cardiac Event Monitor  July-August 2015   Sinus rhythm with PVCs  . COLONOSCOPY N/A 08/31/2013   Procedure: COLONOSCOPY;  Surgeon: Juanita Craver, MD;  Location: [facility] ENDOSCOPY;  Service: Endoscopy;  Laterality: N/A;  . ESOPHAGOGASTRODUODENOSCOPY N/A 09/01/2013   Procedure: ESOPHAGOGASTRODUODENOSCOPY (EGD);  Surgeon: Beryle Beams, MD;  Location: [facility] ENDOSCOPY;  Service: Endoscopy;  Laterality: N/A;  bedside  . LEFT HEART CATHETERIZATION WITH CORONARY ANGIOGRAM N/A 08/30/2013   Procedure: LEFT HEART CATHETERIZATION WITH CORONARY ANGIOGRAM;  Surgeon: Leonie Man, MD;  Location: [facility] CATH LAB: 100%  mRCA (thrombus - extends to RPAV), 80% D1, 40% AVG Cx.  [name] PERCUTANEOUS CORONARY STENT INTERVENTION (PCI-S)  08/30/2013   Procedure: PERCUTANEOUS CORONARY STENT INTERVENTION (PCI-S);  Surgeon: Leonie Man, MD;  Location: [facility] CATH LAB;  Integrity Resolute DES 2.0 mm x 38 mm -- 3.35 mm.; PTCA of proximal RPA V. - 2.0 mm x 15 mm balloon  . PERIPHERAL VASCULAR ATHERECTOMY  05/26/2019   Procedure: PERIPHERAL VASCULAR ATHERECTOMY;  Surgeon: Marty Heck, MD;  Location: [place] [facility];  Service: Cardiovascular;;  Left SFA  . PERIPHERAL VASCULAR INTERVENTION  05/26/2019   Procedure: PERIPHERAL VASCULAR INTERVENTION;  Surgeon: Marty Heck, MD;  Location: [place] [facility];  Service: Cardiovascular;;  Left SFA  . PITUITARY SURGERY    . TRANSTHORACIC ECHOCARDIOGRAM  08/30/2013   mild LVH. EF 50-55%. Moderate HK of the entire inferior myocardium. GR 1 DD. Mild LA dilation. Mildly reduced RV function  . TYMPANOMASTOIDECTOMY Right 12/28/2017   Procedure: RIGHT TYMPANOMASTOIDECTOMY;  Surgeon: Leta Baptist, MD;  Location: [place];  Service: ENT;  Laterality: Right;     Current Outpatient Medications  Medication Sig Dispense Refill  . aspirin EC 81 MG tablet Take 81 mg by mouth daily.    [name] atorvastatin (LIPITOR) 40 MG tablet Take 1 tablet (40 mg total) by mouth daily at 6 PM. 30 tablet 0  . clopidogrel (PLAVIX) 75 MG tablet Take 1 tablet (75 mg total) by mouth daily with breakfast. 30 tablet 1  . DAPTOmycin 800 mg in sodium chloride 0.9 % 100 mL Inject 800 mg into the vein daily at 8 pm. 1 vial 10  . dexamethasone (DECADRON) 0.5 MG tablet Take 0.5 mg by mouth daily.     [name] doxycycline (VIBRA-TABS) 100 MG tablet Take 1 tablet (100 mg total) by mouth 2 (two) times daily. Take on full stomach 60 tablet 0  . FARXIGA 10 MG TABS tablet Take 10 mg by mouth daily.     . ferrous sulfate 325 (65 FE) MG tablet Take 325 mg by mouth daily with breakfast.    . Insulin Glargine (BASAGLAR  KWIKPEN) 100 UNIT/ML SOPN Inject 48 Units into the skin at bedtime. inject 52 units into skin at bedtime    . levothyroxine (SYNTHROID, LEVOTHROID) 175 MCG tablet Take 175 mcg by mouth daily before breakfast.    . lisinopril (ZESTRIL) 10 MG tablet Take 10 mg by mouth daily.    . metoprolol tartrate (LOPRESSOR) 25 MG tablet TAKE 1 TABLET TWICE A DAY 180 tablet 2  . NOVOLOG FLEXPEN 100 UNIT/ML FlexPen Inject 5-14 Units into the skin 3 (three) times daily with meals. Per sliding scale    . Omega-3 Fatty Acids (FISH OIL) 1000 MG CAPS Take 1,000 mg by mouth daily.    . ondansetron (ZOFRAN) 4 MG tablet Take 1 tablet (4 mg total) by mouth every 8 (eight) hours as needed for nausea or vomiting. 20 tablet 1  . ONETOUCH VERIO test strip USE 1 STRIP TO CHECK GLUCOSE THREE TIMES DAILY  1  . pantoprazole (  PROTONIX) 40 MG tablet Take 1 tablet (40 mg total) by mouth 2 (two) times daily before a meal. 60 tablet 0  . potassium chloride SA (KLOR-CON) 20 MEQ tablet Take 20 mEq by mouth daily.     [name] sulfamethoxazole-trimethoprim (BACTRIM) 400-80 MG tablet Take 1 tablet by mouth 2 (two) times daily. 20 tablet 0   No current facility-administered medications for this visit.    Allergies:   Strawberry extract    Social History:  The patient  reports that she has quit smoking. She has never used smokeless tobacco. She reports that she does not drink alcohol or use drugs.   Family History:  The patient's family history includes Alzheimer's disease in her maternal grandmother; Cancer in her sister; Cancer (age of onset: 21) in her mother; Heart attack (age of onset: 26) in her father.    ROS: All other systems are reviewed and negative. Unless otherwise mentioned in H&P    PHYSICAL EXAM: VS:  BP (!) 114/52 (BP Location: Left Arm, Patient Position: Sitting, Cuff Size: Normal)   Pulse 68   Temp 98 F (36.7 C)   Ht 5\' 7"  (1.702 m)   Wt 219 lb (99.3 kg)   BMI 34.30 kg/m  , BMI Body mass index is 34.3  kg/m. GEN: Well nourished, well developed, in no acute distress HEENT: normal Neck: no JVD, carotid bruits, or masses Cardiac: RRR; no murmurs, rubs, or gallops,no edema  Respiratory:  Clear to auscultation bilaterally, normal work of breathing GI: soft, nontender, nondistended, + BS MS: no deformity or atrophy Skin: warm and dry, no rash Neuro:  Strength and sensation are intact Psych: euthymic mood, full affect   EKG: Not completed this office visit  Recent Labs: 05/23/2019: ALT 16 05/30/2019: Hemoglobin 9.9; Magnesium 1.6; Platelets 471 06/23/2019: BUN 40; Creatinine, Ser 2.66; Potassium 4.5; Sodium 137    Lipid Panel No results found for: CHOL, TRIG, HDL, CHOLHDL, VLDL, LDLCALC, LDLDIRECT    Wt Readings from Last 3 Encounters:  07/27/19 219 lb (99.3 kg)  07/06/19 212 lb (96.2 kg)  07/05/19 208 lb (94.3 kg)      Other studies Reviewed: Aortogram with runoff 05/26/2019 Pre-operative Diagnosis:Left foot wound status post left fifth ray amputation in the setting of depressed ABIs and underlying peripheral arterial disease Post-operative diagnosis:Same Surgeon:Christopher Addison Naegeli, MD Procedure Performed: 1.Ultrasound-guided access of the right common femoral artery 2.Aortogram 3.Bilateral lower extremity arteriogram with runoff including selection of third order branches in the left lower extremity 4.Left SFArevascularization withmechanicalorbitalatherectomy (1.5 CSI solid) 5.Left SFA angioplasty including drug-coated balloon (5 mm x 150 mm [place] and 5 mm x 200 mm drug-coated [place]) 6.Left SFA proximal stent placement for focal dissection (coronary artery disease: Mm x 40 mm drug-coatedEluvia) 7.Mynx closure of the right common femoral artery 8.92 minutes of monitored moderate conscious sedation time   ASSESSMENT AND PLAN:  1.  Hypertension: She was very weak tired and hypotensive on the last office visit.  I took away her HCTZ and decreased  her lisinopril dose to 10 mg daily from 20 mg daily.  As result of this she is feeling much better, she has less fatigue, is brighter, and has more energy.  She is literally dancing in the chair while talking to me.  I will continue this lower dose regimen at this time.  Blood pressure is very well controlled today.  Can continue to titrate down if necessary if she remains hypotensive and symptomatic with that.  2.  Coronary artery disease:  History of STEMI and PCI to the RCA in June 2015 with a resolute DES.  She will continue on aspirin and clopidogrel.  No complaints of bleeding or excessive bruising.  She offers no complaints of fatigue or chest discomfort.  At this time I will not repeat any ischemic testing.  3.  PAD: History of FSA revascularization and mechanical orbital atherectomy of the left FSA by Dr. Trula Slade with a drug-coated balloon, 05/26/2019.  She continues on aspirin Plavix and statin.  She is able to walk without discomfort and has regained her energy.  4.  Hyperlipidemia: Goal of LDL less than 70.  Will need to repeat labs on follow-up visit if not completed by primary care or endocrinologist.  Current medicines are reviewed at length with the patient today.  I have spent 25 dedicated to the care of this patient on the date of this encounter to include pre-visit review of records, assessment, management and diagnostic testing,with shared decision making.  Labs/ tests ordered today include: None Phill Myron. [place] Pugh, ANP, AACC   07/27/2019 10:01 AM    [facility] [address] Office [phone] Fax [phone]  Notice: This dictation was prepared with Dragon dictation along with smaller phrase technology. Any transcriptional errors that result from this process are unintentional and may not be corrected upon review.

## 2019-07-27 ENCOUNTER — Encounter: Payer: Self-pay | Admitting: Adult Health

## 2019-07-27 ENCOUNTER — Other Ambulatory Visit: Payer: Self-pay

## 2019-07-27 ENCOUNTER — Ambulatory Visit: Payer: BC Managed Care – PPO | Admitting: Adult Health

## 2019-07-27 VITALS — BP 114/52 | HR 68 | Temp 98.0°F | Ht 67.0 in | Wt 219.0 lb

## 2019-07-27 DIAGNOSIS — I10 Essential (primary) hypertension: Secondary | ICD-10-CM | POA: Diagnosis not present

## 2019-07-27 DIAGNOSIS — I2510 Atherosclerotic heart disease of native coronary artery without angina pectoris: Secondary | ICD-10-CM

## 2019-07-27 DIAGNOSIS — E785 Hyperlipidemia, unspecified: Secondary | ICD-10-CM

## 2019-07-27 DIAGNOSIS — I952 Hypotension due to drugs: Secondary | ICD-10-CM | POA: Diagnosis not present

## 2019-07-27 DIAGNOSIS — I739 Peripheral vascular disease, unspecified: Secondary | ICD-10-CM

## 2019-07-27 NOTE — Patient Instructions (Signed)
Medication Instructions:  Continue current medications  *If you need a refill on your cardiac medications before your next appointment, please call your pharmacy*   Lab Work: None Ordered   Testing/Procedures: None Ordered   Follow-Up: At [facility], you and your health needs are our priority.  As part of our continuing mission to provide you with exceptional heart care, we have created designated Provider Care Teams.  These Care Teams include your primary Cardiologist (physician) and Advanced Practice Providers (APPs -  Physician Assistants and Nurse Practitioners) who all work together to provide you with the care you need, when you need it.  We recommend signing up for the patient portal called "MyChart".  Sign up information is provided on this After Visit Summary.  MyChart is used to connect with patients for Virtual Visits (Telemedicine).  Patients are able to view lab/test results, encounter notes, upcoming appointments, etc.  Non-urgent messages can be sent to your provider as well.   To learn more about what you can do with MyChart, go to [email or web address].    Your next appointment:   6 month(s)  The format for your next appointment:   In Person  Provider:   You may see Peter Jordan, MD or one of the following Advanced Practice Providers on your designated Care Team:    Hao Meng, PA-C  Angela Duke, PA-C or   Krista Kroeger, PA-C      

## 2019-07-28 ENCOUNTER — Encounter: Payer: Self-pay | Admitting: Podiatry

## 2019-07-28 ENCOUNTER — Ambulatory Visit (INDEPENDENT_AMBULATORY_CARE_PROVIDER_SITE_OTHER): Payer: BC Managed Care – PPO | Admitting: Podiatry

## 2019-07-28 DIAGNOSIS — Z9889 Other specified postprocedural states: Secondary | ICD-10-CM

## 2019-07-28 DIAGNOSIS — M869 Osteomyelitis, unspecified: Secondary | ICD-10-CM

## 2019-07-29 NOTE — Progress Notes (Signed)
Subjective: Tammy Good is a 69 y.o. 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  She states that she is doing well and she is having no new concerns.  She is wearing a bedroom slipper today.  Denies any increase in swelling or any redness.  No drainage or pus. Denies any fevers, chills, nausea, vomiting.  No calf pain, chest pain or shortness of breath.  Objective: General: No acute distress, AAOx3  DP/PT pulses palpable LEFT foot: At this time appears to the incision is healed and there is hyperkeratotic tissue on the incision.  Was able to debride this and there is no underlying ulceration drainage or signs of infection.  Mild edema to the foot there is no erythema or warmth.  No obvious signs of infection are noted. No pain with calf compression, swelling, warmth, erythema.     Assessment and Plan:  Status post left foot surgery, doing well with no complications   -Treatment options discussed including all alternatives, risks, and complications -Debrided hyperkeratotic tissue to the any complications or bleeding to reveal incision is healed.  There is no open lesions identified at this time.  No obvious signs of infection.  We will get her measured for diabetic shoes with inserts/toe filler.  I want her to continue moisturizer on the incision daily.  Elevation and try to limit activity level until we can get her into a diabetic shoe with toe filler.   Return in about 3 weeks (around 08/18/2019).   Trula Slade DPM

## 2019-08-01 ENCOUNTER — Other Ambulatory Visit: Payer: Self-pay | Admitting: Internal Medicine

## 2019-08-02 ENCOUNTER — Encounter: Payer: Self-pay | Admitting: Podiatry

## 2019-08-02 ENCOUNTER — Other Ambulatory Visit (HOSPITAL_COMMUNITY): Payer: Self-pay | Admitting: Ophthalmology

## 2019-08-02 ENCOUNTER — Telehealth: Payer: Self-pay | Admitting: Podiatry

## 2019-08-02 DIAGNOSIS — H3582 Retinal ischemia: Secondary | ICD-10-CM

## 2019-08-04 ENCOUNTER — Ambulatory Visit (HOSPITAL_COMMUNITY)
Admission: RE | Admit: 2019-08-04 | Discharge: 2019-08-04 | Disposition: A | Discharge status: Home / Self Care | Payer: BC Managed Care – PPO | Source: Ambulatory Visit | Attending: Cardiology | Admitting: Cardiology

## 2019-08-04 ENCOUNTER — Other Ambulatory Visit: Payer: Self-pay

## 2019-08-04 DIAGNOSIS — H3582 Retinal ischemia: Secondary | ICD-10-CM | POA: Insufficient documentation

## 2019-08-04 NOTE — Telephone Encounter (Signed)
error 

## 2019-08-05 ENCOUNTER — Telehealth: Payer: Self-pay | Admitting: Adult Health

## 2019-08-05 ENCOUNTER — Other Ambulatory Visit: Payer: Self-pay

## 2019-08-05 DIAGNOSIS — I739 Peripheral vascular disease, unspecified: Secondary | ICD-10-CM

## 2019-08-05 MED ORDER — CLOPIDOGREL BISULFATE 75 MG PO TABS: 75.0000 mg | ORAL_TABLET | Freq: Every day | ORAL | 3 refills | Status: DC

## 2019-08-05 NOTE — Telephone Encounter (Signed)
New message   Patient states that she does not have any refills on clopidogrel (PLAVIX) 75 MG tablet. Does the patient need to continue to take this medication? If patient needs to continue to take this medication send to [place] ([place]), [place] - [address]

## 2019-08-05 NOTE — Telephone Encounter (Signed)
Plavix was started by vascular surgeons during hospitalization earlier this year.  I spoke with patient and asked her to contact [facility] regarding this refill.  Number for [facility] provided to patient.

## 2019-08-23 ENCOUNTER — Ambulatory Visit (INDEPENDENT_AMBULATORY_CARE_PROVIDER_SITE_OTHER): Payer: BC Managed Care – PPO | Admitting: Podiatry

## 2019-08-23 ENCOUNTER — Encounter: Payer: Self-pay | Admitting: Podiatry

## 2019-08-23 ENCOUNTER — Ambulatory Visit: Payer: Medicare Other | Admitting: Orthotics

## 2019-08-23 ENCOUNTER — Other Ambulatory Visit: Payer: Self-pay

## 2019-08-23 DIAGNOSIS — M869 Osteomyelitis, unspecified: Secondary | ICD-10-CM

## 2019-08-23 DIAGNOSIS — Z9889 Other specified postprocedural states: Secondary | ICD-10-CM

## 2019-08-23 DIAGNOSIS — E119 Type 2 diabetes mellitus without complications: Secondary | ICD-10-CM

## 2019-08-23 NOTE — Progress Notes (Signed)
Patient cast today for DBS/inserts to address issues secondary to DM2; hx. Of previous foot ulcer, ostmys, 5ht ray amp left.

## 2019-08-29 ENCOUNTER — Other Ambulatory Visit: Payer: Self-pay

## 2019-08-29 ENCOUNTER — Encounter: Payer: Self-pay | Admitting: Internal Medicine

## 2019-08-29 ENCOUNTER — Ambulatory Visit: Payer: BC Managed Care – PPO | Admitting: Internal Medicine

## 2019-08-29 VITALS — BP 135/81 | HR 61 | Temp 98.0°F | Ht 67.0 in | Wt 220.0 lb

## 2019-08-29 DIAGNOSIS — B372 Candidiasis of skin and nail: Secondary | ICD-10-CM

## 2019-08-29 DIAGNOSIS — M86672 Other chronic osteomyelitis, left ankle and foot: Secondary | ICD-10-CM | POA: Diagnosis not present

## 2019-08-29 MED ORDER — NYSTATIN 100000 UNIT/GM EX CREA: 1.0000 "application " | TOPICAL_CREAM | Freq: Two times a day (BID) | CUTANEOUS | 1 refills | Status: DC

## 2019-08-29 NOTE — Progress Notes (Signed)
RFV: follow up on diabetic foot/osteo  Patient ID: Tammy Good, female   DOB: [birth year], 69 y.o.   MRN: [id number]  HPI Dr [name] is pleased with how her foot is healing. Getting a diabetic shoe and insert. Overall doing well. She is noticing rawness underneath her breast since taking abtx. No diarrhea. Or other rashes  Well tolerated covid vaccine Outpatient Encounter Medications as of 08/29/2019  Medication Sig   aspirin EC 81 MG tablet Take 81 mg by mouth daily.   atorvastatin (LIPITOR) 40 MG tablet Take 1 tablet (40 mg total) by mouth daily at 6 PM.   clopidogrel (PLAVIX) 75 MG tablet Take 1 tablet (75 mg total) by mouth daily with breakfast.   dexamethasone (DECADRON) 0.5 MG tablet Take 0.5 mg by mouth daily.    doxycycline (VIBRA-TABS) 100 MG tablet Take 100 mg by mouth 2 (two) times daily.   FARXIGA 10 MG TABS tablet Take 10 mg by mouth daily.    ferrous sulfate 325 (65 FE) MG tablet Take 325 mg by mouth daily with breakfast.   Insulin Glargine (BASAGLAR KWIKPEN) 100 UNIT/ML SOPN Inject 48 Units into the skin at bedtime. inject 52 units into skin at bedtime   levothyroxine (SYNTHROID, LEVOTHROID) 175 MCG tablet Take 175 mcg by mouth daily before breakfast.   lisinopril (ZESTRIL) 10 MG tablet Take 10 mg by mouth daily.   metoprolol tartrate (LOPRESSOR) 25 MG tablet TAKE 1 TABLET TWICE A DAY   NOVOLOG FLEXPEN 100 UNIT/ML FlexPen Inject 5-14 Units into the skin 3 (three) times daily with meals. Per sliding scale   Omega-3 Fatty Acids (FISH OIL) 1000 MG CAPS Take 1,000 mg by mouth daily.   ONETOUCH VERIO test strip USE 1 STRIP TO CHECK GLUCOSE THREE TIMES DAILY   pantoprazole (PROTONIX) 40 MG tablet Take 1 tablet (40 mg total) by mouth 2 (two) times daily before a meal.   ketorolac (ACULAR) 0.5 % ophthalmic solution Place 1 drop into the right eye 4 (four) times daily.   moxifloxacin (VIGAMOX) 0.5 % ophthalmic solution Apply 1 drop to eye 4 (four) times daily.     ondansetron (ZOFRAN) 4 MG tablet Take 1 tablet (4 mg total) by mouth every 8 (eight) hours as needed for nausea or vomiting. (Patient not taking: Reported on 08/29/2019)   prednisoLONE acetate (PRED FORTE) 1 % ophthalmic suspension 1 drop 4 (four) times daily.   [DISCONTINUED] DAPTOmycin 800 mg in sodium chloride 0.9 % 100 mL Inject 800 mg into the vein daily at 8 pm. (Patient not taking: Reported on 08/29/2019)   [DISCONTINUED] potassium chloride SA (KLOR-CON) 20 MEQ tablet Take 20 mEq by mouth daily.    No facility-administered encounter medications on file as of 08/29/2019.     Patient Active Problem List   Diagnosis Date Noted   PAD (peripheral artery disease) ([place]) 07/05/2019   Osteomyelitis of left foot ([place]) 05/24/2019   Osteomyelitis ([place]) 05/23/2019   Cellulitis and abscess of foot, except toes 05/18/2019   Chronic osteomyelitis of ankle and foot ([place]) 05/18/2019   Cellulitis and abscess of toe 11/09/2018   Pre-operative cardiovascular examination 11/30/2017   Iron deficiency anemia 12/10/2014   Obesity (BMI 30-39.9) 11/17/2013   Diabetes mellitus type 2 in obese [facility])    Essential hypertension    Right thigh pain 11/01/2013   PVC's (premature ventricular contractions) 11/01/2013   Status post insertion of drug eluting coronary artery stent to [facility] emergently and PTCA to prox. 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   ST elevation myocardial infarction (STEMI) of inferior wall, subsequent episode of care [facility] 08/22/2013   CAD S/P percutaneous coronary angioplasty 08/22/2013     Health Maintenance Due  Topic Date Due   Hepatitis C Screening  Never done   OPHTHALMOLOGY EXAM  Never done   TETANUS/TDAP  Never done   MAMMOGRAM  Never done   HEMOGLOBIN A1C  03/04/2014   DEXA SCAN  Never done   PNA vac Low Risk Adult (1 of 2 - PCV13) Never done   FOOT EXAM  08/03/2019     Review of  Systems Review of Systems  Constitutional: Negative for fever, chills, diaphoresis, activity change, appetite change, fatigue and unexpected weight change.  HENT: Negative for congestion, sore throat, rhinorrhea, sneezing, trouble swallowing and sinus pressure.  Eyes: Negative for photophobia and visual disturbance.  Respiratory: Negative for cough, chest tightness, shortness of breath, wheezing and stridor.  Cardiovascular: Negative for chest pain, palpitations and leg swelling.  Gastrointestinal: Negative for nausea, vomiting, abdominal pain, diarrhea, constipation, blood in stool, abdominal distention and anal bleeding.  Genitourinary: Negative for dysuria, hematuria, flank pain and difficulty urinating.  Musculoskeletal: Negative for myalgias, back pain, joint swelling, arthralgias and gait problem.  Skin: Negative for color change, pallor, rash and wound.  Neurological: Negative for dizziness, tremors, weakness and light-headedness.  Hematological: Negative for adenopathy. Does not bruise/bleed easily.  Psychiatric/Behavioral: Negative for behavioral problems, confusion, sleep disturbance, dysphoric mood, decreased concentration and agitation.    Physical Exam   BP 135/81    Pulse 61    Temp 98 F (36.7 C) (Oral)    Ht 5\' 7"  (1.702 m)    Wt 220 lb (99.8 kg)    SpO2 99%    BMI 34.46 kg/m   Physical Exam  Constitutional:  oriented to person, place, and time. appears well-developed and well-nourished. No distress.  HENT: [place]/AT, PERRLA, no scleral icterus Mouth/Throat: Oropharynx is clear and moist. No oropharyngeal exudate.  Cardiovascular: Normal rate, regular rhythm and normal heart sounds. Exam reveals no gallop and no friction rub.  No murmur heard.  Pulmonary/Chest: Effort normal and breath sounds normal. No respiratory distress.  has no wheezes.  Skin: left foot well healed incision from surgery Psychiatric: a normal mood and affect.  behavior is normal.   CBC Lab Results   Component Value Date   WBC 9.9 05/30/2019   RBC 3.43 (L) 05/30/2019   HGB 9.9 (L) 05/30/2019   HCT 32.6 (L) 05/30/2019   PLT 471 (H) 05/30/2019   MCV 95.0 05/30/2019   MCH 28.9 05/30/2019   MCHC 30.4 05/30/2019   RDW 15.9 (H) 05/30/2019   LYMPHSABS 1.3 05/30/2019   MONOABS 0.9 05/30/2019   EOSABS 0.2 05/30/2019    BMET Lab Results  Component Value Date   NA 137 06/23/2019   K 4.5 06/23/2019   CL 104 06/23/2019   CO2 23 06/23/2019   GLUCOSE 218 (H) 06/23/2019   BUN 40 (H) 06/23/2019   CREATININE 2.66 (H) 06/23/2019   CALCIUM 8.9 06/23/2019   GFRNONAA 18 (L) 06/23/2019   GFRAA 21 (L) 06/23/2019     Assessment and Plan  Fungal skin infection underbreast and groin area = nystatin cream  Diabetic foot infection =Finish last week of doxycycline then no further need Well healed

## 2019-08-31 NOTE — Progress Notes (Signed)
Subjective: Tammy Good is a 69 y.o. 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  She is pleased with how the wound is been doing and is healed.  She has no pain, swelling or redness.  She has 1 more week of antibiotics and she is follow-up with infectious disease.  She is awaiting diabetic insert. Denies any fevers, chills, nausea, vomiting.  No calf pain, chest pain or shortness of breath.  Objective: General: No acute distress, AAOx3  DP/PT pulses palpable LEFT foot: Incision appears to be well-healed at this time.  There is a hyperkeratotic tissue on the plantar aspect of the foot on the lateral aspect and upon debridement there is no ongoing ulceration identified.  There is no surrounding erythema or ascending cellulitis.  There is no pain.   No pain with calf compression, swelling, warmth, erythema.         Assessment and Plan:  Status post left foot surgery, doing well with no complications   -Treatment options discussed including all alternatives, risks, and complications -Debrided hyperkeratotic tissue on the plantar aspect of any complications or bleeding.  The wound appears to be healed there is no obvious signs of infection.  Awaiting diabetic shoe insert.  Otherwise she is done well but still will monitor closely for any reoccurrence until she is back into regular shoe and at that point I will see her on a routine basis for diabetic foot exam.  Return in about 4 weeks (around 09/20/2019).  Trula Slade DPM

## 2019-09-13 ENCOUNTER — Emergency Department (HOSPITAL_COMMUNITY): Payer: No Typology Code available for payment source

## 2019-09-13 ENCOUNTER — Encounter (HOSPITAL_COMMUNITY): Payer: Self-pay | Admitting: Emergency Medicine

## 2019-09-13 ENCOUNTER — Emergency Department (HOSPITAL_COMMUNITY)
Admission: EM | Admit: 2019-09-13 | Discharge: 2019-09-13 | Disposition: A | Discharge status: Home / Self Care | Payer: No Typology Code available for payment source | Attending: Emergency Medicine | Admitting: Emergency Medicine

## 2019-09-13 DIAGNOSIS — I2510 Atherosclerotic heart disease of native coronary artery without angina pectoris: Secondary | ICD-10-CM | POA: Diagnosis not present

## 2019-09-13 DIAGNOSIS — Z7982 Long term (current) use of aspirin: Secondary | ICD-10-CM | POA: Insufficient documentation

## 2019-09-13 DIAGNOSIS — Z7901 Long term (current) use of anticoagulants: Secondary | ICD-10-CM | POA: Insufficient documentation

## 2019-09-13 DIAGNOSIS — Y999 Unspecified external cause status: Secondary | ICD-10-CM | POA: Diagnosis not present

## 2019-09-13 DIAGNOSIS — W010XXA Fall on same level from slipping, tripping and stumbling without subsequent striking against object, initial encounter: Secondary | ICD-10-CM | POA: Diagnosis not present

## 2019-09-13 DIAGNOSIS — E119 Type 2 diabetes mellitus without complications: Secondary | ICD-10-CM | POA: Diagnosis not present

## 2019-09-13 DIAGNOSIS — Y929 Unspecified place or not applicable: Secondary | ICD-10-CM | POA: Insufficient documentation

## 2019-09-13 DIAGNOSIS — M25562 Pain in left knee: Secondary | ICD-10-CM

## 2019-09-13 DIAGNOSIS — Y939 Activity, unspecified: Secondary | ICD-10-CM | POA: Insufficient documentation

## 2019-09-13 DIAGNOSIS — Z23 Encounter for immunization: Secondary | ICD-10-CM | POA: Diagnosis not present

## 2019-09-13 DIAGNOSIS — S81812A Laceration without foreign body, left lower leg, initial encounter: Secondary | ICD-10-CM | POA: Diagnosis present

## 2019-09-13 DIAGNOSIS — Z794 Long term (current) use of insulin: Secondary | ICD-10-CM | POA: Diagnosis not present

## 2019-09-13 DIAGNOSIS — E039 Hypothyroidism, unspecified: Secondary | ICD-10-CM | POA: Insufficient documentation

## 2019-09-13 DIAGNOSIS — W19XXXA Unspecified fall, initial encounter: Secondary | ICD-10-CM

## 2019-09-13 DIAGNOSIS — M25561 Pain in right knee: Secondary | ICD-10-CM

## 2019-09-13 MED ORDER — TETANUS-DIPHTH-ACELL PERTUSSIS 5-2.5-18.5 LF-MCG/0.5 IM SUSP
0.5000 mL | Freq: Once | INTRAMUSCULAR | Status: AC
  Administered 2019-09-13: 0.5 mL via INTRAMUSCULAR
  Filled 2019-09-13: qty 0.5

## 2019-09-13 MED ORDER — ACETAMINOPHEN 325 MG PO TABS
650.0000 mg | ORAL_TABLET | Freq: Once | ORAL | Status: AC
  Administered 2019-09-13: 650 mg via ORAL
  Filled 2019-09-13: qty 2

## 2019-09-13 MED ORDER — CEPHALEXIN 500 MG PO CAPS: 500.0000 mg | ORAL_CAPSULE | Freq: Three times a day (TID) | ORAL | 0 refills | Status: AC

## 2019-09-13 MED ORDER — ACETAMINOPHEN ER 650 MG PO TBCR: 650.0000 mg | EXTENDED_RELEASE_TABLET | Freq: Three times a day (TID) | ORAL | 0 refills | Status: DC | PRN

## 2019-09-13 MED ORDER — LIDOCAINE-EPINEPHRINE (PF) 2 %-1:200000 IJ SOLN
10.0000 mL | Freq: Once | INTRAMUSCULAR | Status: AC
  Administered 2019-09-13: 10 mL
  Filled 2019-09-13: qty 20

## 2019-09-13 NOTE — ED Notes (Signed)
Patient verbalizes understanding of discharge instructions. Opportunity for questioning and answers were provided. Armband removed by staff, pt discharged from ED via wheelchair to lobby to return home with family.  

## 2019-09-13 NOTE — ED Notes (Signed)
Ortho tech called 

## 2019-09-13 NOTE — Discharge Instructions (Addendum)
As discussed, your x-rays are negative for any broken bones.  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  I am sending you home with Tylenol as needed for pain.  I am also sending you home with antibiotics given that your wound was open for numerous hours.  Take as prescribed and finish all antibiotics.  Keep knee immobilizer on knee until staples are removed to ensure proper healing.  Please follow-up with PCP this week for a wound recheck.  You will need your staples removed in 10 to 14 days.  You may return to the ER, go to urgent care, or go to your PCP for removal.  Return to the ER for new or worsening symptoms.

## 2019-09-13 NOTE — ED Triage Notes (Signed)
Pt. Stated, I had a fall this morning at work and cut my left knee. Went to UC and they said it was too deep.

## 2019-09-13 NOTE — ED Provider Notes (Signed)
[place] EMERGENCY DEPARTMENT Provider Note   CSN: [id number] Arrival date & time: 09/13/19  1059     History Chief Complaint  Patient presents with  . Fall  . Laceration  . Leg Pain    Tammy Good is a 69 y.o. female with a past medical history significant for CAD status post percutaneous coronary angioplasty, diabetes mellitus, hyperlipidemia, hypothyroidism, hypertension, history of STEMI, and PAD who presents to the ED after a mechanical fall that occurred earlier today.  Patient states she was walking and tripped over her shoes causing her to fall directly on her bilateral knees.  Patient sustained a deep laceration to her left knee.  Patient was evaluated at urgent care prior to arrival and was sent to the ED for further evaluation given the depth of the laceration.  Patient denies numbness and tingling the left lower extremity.  Patient rates her pain a 5/10, worse with palpation and movement.  No treatment prior to arrival.  Laceration was wrapped at urgent care prior to arrival.  Patient is currently on ASA 81 mg and Plavix.  Denies head injury and loss of consciousness.  Patient also admits to right medial knee pain, worse with movement.  History obtained from patient and past medical records. No interpreter used during encounter.      Past Medical History:  Diagnosis Date  . CAD S/P percutaneous coronary angioplasty 08/2013   100% mRCA - PCI Integrity Resolute DES 3.0 mm x 38 mm - 3.35 mm; PTCA of RPA V 2.0 mm x 15 mm  . Cholesteatoma    right  . Diabetes mellitus type 2 in obese (HCC)    On insulin and Invokana  . Hyperlipidemia with target LDL less than 70   . Hypothyroidism (acquired)   . Mild essential hypertension   . Obesity (BMI 30-39.9) 11/17/2013  . Panhypopituitarism ([place])   . ST elevation myocardial infarction (STEMI) of inferior wall, subsequent episode of care ([place]) 08/2013   80% branch of D1, 40% mid AV groove circumflex, 100% RCA  with subacute thrombus -- thrombus extending into RPA V with 100% occlusion after initial angioplasty of mid RCA ;; Post MI ECHO 6/9/'15: EF 50-55%, mild LVH with moderate HK of inferior wall, Gr1 DD, mild LA dilation; mildly reduced RV function  . Status post insertion of drug eluting coronary artery stent to RCA emergently 09/16/2013    Patient Active Problem List   Diagnosis Date Noted  . PAD (peripheral artery disease) ([place]) 07/05/2019  . Osteomyelitis of left foot ([place]) 05/24/2019  . Osteomyelitis ([place]) 05/23/2019  . Cellulitis and abscess of foot, except toes 05/18/2019  . Chronic osteomyelitis of ankle and foot ([place]) 05/18/2019  . Cellulitis and abscess of toe 11/09/2018  . Pre-operative cardiovascular examination 11/30/2017  . Iron deficiency anemia 12/10/2014  . Obesity (BMI 30-39.9) 11/17/2013  . Diabetes mellitus type 2 in obese ([place])   . Essential hypertension   . Right thigh pain 11/01/2013  . PVC's (premature ventricular contractions) 11/01/2013  . Status post insertion of drug eluting coronary artery stent to [facility] emergently and PTCA to prox. PLA 09/16/2013  . Hyperlipidemia LDL goal <70 09/16/2013  . Panhypopituitarism ([place]) 08/30/2013  . ST elevation myocardial infarction (STEMI) of inferior wall, subsequent episode of care ([place]) 08/22/2013  . CAD S/P percutaneous coronary angioplasty 08/22/2013    Past Surgical History:  Procedure Laterality Date  . ABDOMINAL AORTOGRAM W/LOWER EXTREMITY N/A 05/26/2019   Procedure: ABDOMINAL AORTOGRAM W/LOWER EXTREMITY;  Surgeon: Marty Heck, MD;  Location: [place] CV LAB;  Service: Cardiovascular;  Laterality: N/A;  . AMPUTATION Left 05/25/2019   Procedure: AMPUTATION RAY 5th;  Surgeon: Trula Slade, DPM;  Location: [place] [facility];  Service: Podiatry;  Laterality: Left;  . Cardiac Event Monitor  July-August 2015   Sinus rhythm with PVCs  . COLONOSCOPY N/A 08/31/2013   Procedure: COLONOSCOPY;  Surgeon: Juanita Craver, MD;   Location: [facility] ENDOSCOPY;  Service: Endoscopy;  Laterality: N/A;  . ESOPHAGOGASTRODUODENOSCOPY N/A 09/01/2013   Procedure: ESOPHAGOGASTRODUODENOSCOPY (EGD);  Surgeon: Beryle Beams, MD;  Location: [facility] ENDOSCOPY;  Service: Endoscopy;  Laterality: N/A;  bedside  . LEFT HEART CATHETERIZATION WITH CORONARY ANGIOGRAM N/A 08/30/2013   Procedure: LEFT HEART CATHETERIZATION WITH CORONARY ANGIOGRAM;  Surgeon: Leonie Man, MD;  Location: [facility] CATH LAB: 100% mRCA (thrombus - extends to RPAV), 80% D1, 40% AVG Cx.  [name] PERCUTANEOUS CORONARY STENT INTERVENTION (PCI-S)  08/30/2013   Procedure: PERCUTANEOUS CORONARY STENT INTERVENTION (PCI-S);  Surgeon: Leonie Man, MD;  Location: [facility] CATH LAB;  Integrity Resolute DES 2.0 mm x 38 mm -- 3.35 mm.; PTCA of proximal RPA V. - 2.0 mm x 15 mm balloon  . PERIPHERAL VASCULAR ATHERECTOMY  05/26/2019   Procedure: PERIPHERAL VASCULAR ATHERECTOMY;  Surgeon: Marty Heck, MD;  Location: [place] CV LAB;  Service: Cardiovascular;;  Left SFA  . PERIPHERAL VASCULAR INTERVENTION  05/26/2019   Procedure: PERIPHERAL VASCULAR INTERVENTION;  Surgeon: Marty Heck, MD;  Location: [place] CV LAB;  Service: Cardiovascular;;  Left SFA  . PITUITARY SURGERY    . TRANSTHORACIC ECHOCARDIOGRAM  08/30/2013   mild LVH. EF 50-55%. Moderate HK of the entire inferior myocardium. GR 1 DD. Mild LA dilation. Mildly reduced RV function  . TYMPANOMASTOIDECTOMY Right 12/28/2017   Procedure: RIGHT TYMPANOMASTOIDECTOMY;  Surgeon: Leta Baptist, MD;  Location: [place];  Service: ENT;  Laterality: Right;     OB History   No obstetric history on file.     Family History  Problem Relation Age of Onset  . Cancer Mother 20       multiple myeloma  . Heart attack Father 34  . Cancer Sister   . Alzheimer's disease Maternal Grandmother     Social History   Tobacco Use  . Smoking status: Former Research scientist (life sciences)  . Smokeless tobacco: Never Used  Substance Use Topics  . Alcohol use:  No  . Drug use: No    Home Medications Prior to Admission medications   Medication Sig Start Date End Date Taking? Authorizing Provider  aspirin EC 81 MG tablet Take 81 mg by mouth daily.    [provider]  atorvastatin (LIPITOR) 40 MG tablet Take 1 tablet (40 mg total) by mouth daily at 6 PM. 09/03/13   Cherene Altes, MD  clopidogrel (PLAVIX) 75 MG tablet Take 1 tablet (75 mg total) by mouth daily with breakfast. 08/05/19   Marty Heck, MD  dexamethasone (DECADRON) 0.5 MG tablet Take 0.5 mg by mouth daily.  09/03/13   Cherene Altes, MD  doxycycline (VIBRA-TABS) 100 MG tablet Take 100 mg by mouth 2 (two) times daily.    [provider]  FARXIGA 10 MG TABS tablet Take 10 mg by mouth daily.  07/25/15   [provider]  ferrous sulfate 325 (65 FE) MG tablet Take 325 mg by mouth daily with breakfast.    [provider]  Insulin Glargine (BASAGLAR KWIKPEN) 100 UNIT/ML SOPN Inject 48  Units into the skin at bedtime. inject 52 units into skin at bedtime 07/11/15   [provider]  ketorolac (ACULAR) 0.5 % ophthalmic solution Place 1 drop into the right eye 4 (four) times daily. 08/01/19   [provider]  levothyroxine (SYNTHROID, LEVOTHROID) 175 MCG tablet Take 175 mcg by mouth daily before breakfast.    [provider]  lisinopril (ZESTRIL) 10 MG tablet Take 10 mg by mouth daily.    [provider]  metoprolol tartrate (LOPRESSOR) 25 MG tablet TAKE 1 TABLET TWICE A DAY 02/04/17   Leonie Man, MD  moxifloxacin (VIGAMOX) 0.5 % ophthalmic solution Apply 1 drop to eye 4 (four) times daily. 08/01/19   [provider]  NOVOLOG FLEXPEN 100 UNIT/ML FlexPen Inject 5-14 Units into the skin 3 (three) times daily with meals. Per sliding scale 01/03/19   [provider]  nystatin cream (MYCOSTATIN) Apply 1 application topically 2 (two) times daily. 08/29/19   Carlyle Basques, MD  Omega-3 Fatty Acids (FISH OIL)  1000 MG CAPS Take 1,000 mg by mouth daily.    [provider]  ondansetron (ZOFRAN) 4 MG tablet Take 1 tablet (4 mg total) by mouth every 8 (eight) hours as needed for nausea or vomiting. Patient not taking: Reported on 08/29/2019 07/06/19   Carlyle Basques, MD  [facility] VERIO test strip USE 1 STRIP TO [place] DAILY 12/24/17   [provider]  pantoprazole (PROTONIX) 40 MG tablet Take 1 tablet (40 mg total) by mouth 2 (two) times daily before a meal. 09/03/13   Cherene Altes, MD  prednisoLONE acetate (PRED FORTE) 1 % ophthalmic suspension 1 drop 4 (four) times daily. 08/01/19   [provider]    Allergies    Strawberry extract  Review of Systems   Review of Systems  Musculoskeletal: Positive for arthralgias.  Skin: Positive for color change and wound.  Neurological: Negative for numbness.  All other systems reviewed and are negative.   Physical Exam Updated Vital Signs BP (!) 118/58 (BP Location: Right Arm)   Pulse 73   Temp 98.1 F (36.7 C) (Oral)   Resp 18   SpO2 100%   Physical Exam Vitals and nursing note reviewed.  Constitutional:      General: She is not in acute distress.    Appearance: She is not ill-appearing.     Comments: Very pleasant 69 year old female  HENT:     Head: Normocephalic.  Eyes:     Pupils: Pupils are equal, round, and reactive to light.  Cardiovascular:     Rate and Rhythm: Normal rate and regular rhythm.     Pulses: Normal pulses.     Heart sounds: Normal heart sounds. No murmur heard.  No friction rub. No gallop.   Pulmonary:     Effort: Pulmonary effort is normal.     Breath sounds: Normal breath sounds.  Abdominal:     General: Abdomen is flat. There is no distension.     Palpations: Abdomen is soft.     Tenderness: There is no abdominal tenderness. There is no guarding or rebound.  Musculoskeletal:     Cervical back: Neck supple.     Comments: Tenderness palpation surrounding laceration  over left knee.  Distal pulses and sensation intact.  Full range of motion of left knee and left ankle.  Tenderness palpation over medial aspect of right knee.  No edema, erythema, or warmth.  Full range of motion of right knee. Soft compartments.  Skin:    Comments: 8 cm laceration directly inferior to left knee.  See photo below.  Neurological:     General: No focal deficit present.     Mental Status: She is alert.  Psychiatric:        Mood and Affect: Mood normal.        Behavior: Behavior normal.       ED Results / Procedures / Treatments   Labs (all labs ordered are listed, but only abnormal results are displayed) Labs Reviewed - No data to display  EKG None  Radiology DG Knee Complete 4 Views Left  Result Date: 09/13/2019 CLINICAL DATA:  Fall walking into work this morning with bilateral knee pain. EXAM: LEFT KNEE - COMPLETE 4+ VIEW COMPARISON:  None. FINDINGS: No acute fracture or dislocation. Mild tricompartmental peripheral spurring. There is medial tibiofemoral joint space narrowing. There are at least 2 ossified intra-articular bodies in the anterolateral compartment. No significant joint effusion. Advanced vascular calcifications. IMPRESSION: 1. No acute fracture or dislocation of the left knee. 2. 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 Electronically Signed   By: Keith Rake M.D.   On: 09/13/2019 21:45   DG Knee Complete 4 Views Right  Result Date: 09/13/2019 CLINICAL DATA:  Fall walking into work this morning bilateral knee pain EXAM: RIGHT KNEE - COMPLETE 4+ VIEW COMPARISON:  None. FINDINGS: No acute fracture or dislocation. Mild to moderate tricompartmental osteoarthritis with peripheral spurring. Mild medial tibiofemoral joint space narrowing. Trace knee joint effusion. There are advanced vascular calcifications. IMPRESSION: 1. No acute fracture or dislocation of the right knee. 2.  Mild to moderate tricompartmental osteoarthritis. Small knee joint effusion. Electronically Signed   By: Keith Rake M.D.   On: 09/13/2019 21:43    Procedures .Marland KitchenLaceration Repair  Date/Time: 09/13/2019 10:24 PM Performed by: Suzy Bouchard, PA-C Authorized by: Suzy Bouchard, PA-C   Consent:    Consent obtained:  Verbal   Consent given by:  Patient   Risks discussed:  Infection, need for additional repair, pain, poor cosmetic result and poor wound healing   Alternatives discussed:  No treatment and delayed treatment Universal protocol:    Procedure explained and questions answered to patient or proxy's satisfaction: yes     Relevant documents present and verified: yes     Test results available and properly labeled: yes     Imaging studies available: yes     Required blood products, implants, devices, and special equipment available: yes     Site/side marked: yes     Immediately prior to procedure, a time out was called: yes     Patient identity confirmed:  Verbally with patient Anesthesia (see MAR for exact dosages):    Anesthesia method:  Local infiltration   Local anesthetic:  Lidocaine 2% WITH epi Laceration details:    Location:  Leg   Leg location:  L lower leg   Length (cm):  8   Depth (mm):  3 Repair type:    Repair type:  Intermediate Pre-procedure details:    Preparation:  Patient was prepped and draped in usual sterile fashion and imaging obtained to evaluate for foreign bodies Exploration:    Hemostasis achieved with:  Direct pressure and epinephrine   Wound exploration: wound explored through full range of motion and entire depth of wound probed and visualized     Wound extent: no areolar tissue violation noted, no fascia violation noted, no foreign bodies/material noted,  no muscle damage noted, no nerve damage noted, no tendon damage noted, no underlying fracture noted and no vascular damage noted     Contaminated: no   Treatment:    Area cleansed  with:  Saline   Amount of cleaning:  Extensive   Irrigation solution:  Sterile saline   Irrigation volume:  500   Irrigation method:  Pressure wash   Visualized foreign bodies/material removed: no   Skin repair:    Repair method:  Staples   Number of staples:  18 Approximation:    Approximation:  Close Post-procedure details:    Dressing:  Non-adherent dressing   Patient tolerance of procedure:  Tolerated well, no immediate complications   (including critical care time)  Medications Ordered in ED Medications  lidocaine-EPINEPHrine (XYLOCAINE W/EPI) 2 %-1:200000 (PF) injection 10 mL (has no administration in time range)  acetaminophen (TYLENOL) tablet 650 mg (650 mg Oral Given 09/13/19 2032)  Tdap (BOOSTRIX) injection 0.5 mL (0.5 mLs Intramuscular Given 09/13/19 2036)    ED Course  I have reviewed the triage vital signs and the nursing notes.  Pertinent labs & imaging results that were available during my care of the patient were reviewed by me and considered in my medical decision making (see chart for details).    MDM Rules/Calculators/A&P                         69 year old female presents to the ED after a mechanical fall directly on her left knee causing a deep laceration.  Patient sent from urgent care for further evaluation due to depth of laceration.  No head injury or loss of consciousness.  Patient is currently on ASA 81 mg and Plavix.  Stable vitals.  Patient in no acute distress and nontoxic-appearing.  Physical exam significant for 8 cm laceration inferior to left knee.  Tenderness palpation over medial aspect of right knee.  Lower extremities neurovascularly intact bilaterally.  Full range of motion of bilateral knees.  Will obtain x-rays to rule out underlying bony fractures.  Tylenol given for pain.  X-rays personally reviewed which demonstrates: IMPRESSION:  1. No acute fracture or dislocation of the left knee.  2. Tricompartmental osteoarthritis with at least 2  ossified  intra-articular bodies in the lateral tibiofemoral compartment.   IMPRESSION:  1. No acute fracture or dislocation of the right knee.  2. Mild to moderate tricompartmental osteoarthritis. Small knee  joint effusion.   Staples applied to laceration. See procedure note above. Patient able to fully extend and flex knee. Laceration lower than knee joint. Low suspicion for joint involvement. Low suspicion for tendon involvement given full ROM. Discussed case with Dr. Sherry Ruffing who evaluated patient at bedside and agrees with assessment and plan. Will discharge patient with Tylenol as needed for pain management. Will also discharge patient with Keflex given she has been waiting over 11 hours for laceration repair. Knee immobilizer placed on her knee to ensure stability and prevent flexion of knee to ensure proper healing. Crutches given. Orthopedic number given to patient at discharge. Instructed patient to call this week to schedule an appointment for further evaluation. Strict ED precautions discussed with patient. Patient states understanding and agrees to plan. Patient discharged home in no acute distress and stable vitals  Final Clinical Impression(s) / ED Diagnoses Final diagnoses:  Fall, initial encounter  Acute pain of both knees  Laceration of left lower extremity, initial encounter    Rx / DC Orders ED Discharge Orders  None       Karie Kirks 09/13/19 2231    Tegeler, Gwenyth Allegra, MD 09/14/19 Laureen Abrahams

## 2019-09-13 NOTE — Progress Notes (Signed)
Orthopedic Tech Progress Note Patient Details:  Tammy Good January 07, 1951 [id number]  Ortho Devices Type of Ortho Device: Knee Immobilizer, Crutches Ortho Device/Splint Location: LLE Ortho Device/Splint Interventions: Application, Ordered   Post Interventions Patient Tolerated: Well Instructions Provided: Care of device   Lynia Landry A Liberti Appleton 09/13/2019, 11:53 PM

## 2019-09-15 ENCOUNTER — Telehealth: Payer: Self-pay | Admitting: Surgery

## 2019-09-15 NOTE — Telephone Encounter (Signed)
Receive call from patient concerning patient ortho referral. Patient states information was not placed on her after visit summary.  ED CM reviewed record and EDP notes indicate that patient is to follow up with ortho post discharge. Patient states she has been to [facility]) in the past. CM advised patient contact them in the am to schedule a follow up appointment. Patient is agreeable. No further ED CM needs identified.

## 2019-10-03 ENCOUNTER — Encounter (HOSPITAL_COMMUNITY): Payer: BC Managed Care – PPO

## 2019-10-03 ENCOUNTER — Inpatient Hospital Stay (HOSPITAL_COMMUNITY): Admission: RE | Admit: 2019-10-03 | Payer: BC Managed Care – PPO | Source: Ambulatory Visit

## 2019-10-03 ENCOUNTER — Ambulatory Visit: Payer: BC Managed Care – PPO

## 2019-10-06 ENCOUNTER — Ambulatory Visit (HOSPITAL_COMMUNITY)
Admission: RE | Admit: 2019-10-06 | Discharge: 2019-10-06 | Disposition: A | Discharge status: Home / Self Care | Payer: BC Managed Care – PPO | Source: Ambulatory Visit | Attending: Vascular Surgery | Admitting: Vascular Surgery

## 2019-10-06 ENCOUNTER — Ambulatory Visit (INDEPENDENT_AMBULATORY_CARE_PROVIDER_SITE_OTHER)
Admission: RE | Admit: 2019-10-06 | Discharge: 2019-10-06 | Disposition: A | Discharge status: Home / Self Care | Payer: BC Managed Care – PPO | Source: Ambulatory Visit | Attending: Vascular Surgery | Admitting: Vascular Surgery

## 2019-10-06 ENCOUNTER — Other Ambulatory Visit: Payer: Self-pay

## 2019-10-06 ENCOUNTER — Ambulatory Visit: Payer: BC Managed Care – PPO | Admitting: Physician Assistant

## 2019-10-06 VITALS — BP 127/68 | HR 87 | Temp 97.8°F | Resp 20 | Ht 67.0 in | Wt 217.1 lb

## 2019-10-06 DIAGNOSIS — I739 Peripheral vascular disease, unspecified: Secondary | ICD-10-CM

## 2019-10-06 NOTE — Progress Notes (Signed)
HISTORY AND PHYSICAL     CC:  follow up. Requesting Provider:  [name], MD  HPI: This is a 69 y.o. female who is here today for follow up for PAD.  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    On 05/26/2019 she underwent left SFA atherectomy with angioplasty including drug-coated balloon and ultimately required a proximal left SFA stent with a 6 x 40 Luttonix for a focal dissection.  She had a patent popliteal artery with three-vessel runoff in the left lower extremity.    At her last visit, she had seen Dr. Earleen Newport with podiatry the week prior and states that the toe wound is nearly completely healed.  She states there were no concerns from his standpoint.  Foot feels good.  Home health is coming out and doing dressing changes.  She has had no issues with the right leg which is the opposite side.  The pt returns today for duplex studies.   She states she is doing well and walking without any claudication sx.  Her wound from her left 5th metatarsal amputation has completely healed.  She states that she does have some swelling in her legs but this gets better with elevation.  She usually wears compression, which helps, but she hasn't worn them this week.    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    She has had her covid vaccine.   The pt is on a statin for cholesterol management.    The pt is on an aspirin.    Other AC:  Plavix The pt is on ACEI and BB for hypertension.  The pt does have diabetes. Tobacco hx:  former  Pt does not have family hx of AAA.  Past Medical History:  Diagnosis Date  . CAD S/P percutaneous coronary angioplasty 08/2013   100% mRCA - PCI Integrity Resolute DES 3.0 mm x 38 mm - 3.35 mm; PTCA of RPA V 2.0 mm x 15 mm  . Cholesteatoma    right  . Diabetes mellitus type 2 in obese (HCC)    On insulin and Invokana  . Hyperlipidemia with target LDL less than 70   . Hypothyroidism (acquired)   . Mild essential  hypertension   . Obesity (BMI 30-39.9) 11/17/2013  . Panhypopituitarism ([place])   . ST elevation myocardial infarction (STEMI) of inferior wall, subsequent episode of care ([place]) 08/2013   80% branch of D1, 40% mid AV groove circumflex, 100% RCA with subacute thrombus -- thrombus extending into RPA V with 100% occlusion after initial angioplasty of mid RCA ;; Post MI ECHO 6/9/'15: EF 50-55%, mild LVH with moderate HK of inferior wall, Gr1 DD, mild LA dilation; mildly reduced RV function  . Status post insertion of drug eluting coronary artery stent to RCA emergently 09/16/2013    Past Surgical History:  Procedure Laterality Date  . ABDOMINAL AORTOGRAM W/LOWER EXTREMITY N/A 05/26/2019   Procedure: ABDOMINAL AORTOGRAM W/LOWER EXTREMITY;  Surgeon: Marty Heck, MD;  Location: [place] [facility];  Service: Cardiovascular;  Laterality: N/A;  . AMPUTATION Left 05/25/2019   Procedure: AMPUTATION RAY 5th;  Surgeon: Trula Slade, DPM;  Location: [place];  Service: Podiatry;  Laterality: Left;  . Cardiac Event Monitor  July-August 2015   Sinus rhythm with PVCs  . COLONOSCOPY N/A 08/31/2013   Procedure: COLONOSCOPY;  Surgeon: Juanita Craver, MD;  Location: [facility] ENDOSCOPY;  Service: Endoscopy;  Laterality: N/A;  . ESOPHAGOGASTRODUODENOSCOPY N/A  09/01/2013   Procedure: ESOPHAGOGASTRODUODENOSCOPY (EGD);  Surgeon: Beryle Beams, MD;  Location: [facility] ENDOSCOPY;  Service: Endoscopy;  Laterality: N/A;  bedside  . LEFT HEART CATHETERIZATION WITH CORONARY ANGIOGRAM N/A 08/30/2013   Procedure: LEFT HEART CATHETERIZATION WITH CORONARY ANGIOGRAM;  Surgeon: Leonie Man, MD;  Location: [facility] CATH LAB: 100% mRCA (thrombus - extends to RPAV), 80% D1, 40% AVG Cx.  [name] PERCUTANEOUS CORONARY STENT INTERVENTION (PCI-S)  08/30/2013   Procedure: PERCUTANEOUS CORONARY STENT INTERVENTION (PCI-S);  Surgeon: Leonie Man, MD;  Location[facility] C[facility] LAB;  Integrity Resolute DES 2.0 mm x 38 mm -- 3.35 mm.; PTCA of proximal RPA V. - 2.0 mm x 15  mm balloon  . PERIPHERAL VASCULAR ATHERECTOMY  05/26/2019   Procedure: PERIPHERAL VASCULAR ATHERECTOMY;  Surgeon: Marty Heck, MD;  Location: [place] [facility];  Service: Cardiovascular;;  Left SFA  . PERIPHERAL VASCULAR INTERVENTION  05/26/2019   Procedure: PERIPHERAL VASCULAR INTERVENTION;  Surgeon: Marty Heck, MD;  Location: [place] [facility];  Service: Cardiovascular;;  Left SFA  . PITUITARY SURGERY    . TRANSTHORACIC ECHOCARDIOGRAM  08/30/2013   mild LVH. EF 50-55%. Moderate HK of the entire inferior myocardium. GR 1 DD. Mild LA dilation. Mildly reduced RV function  . TYMPANOMASTOIDECTOMY Right 12/28/2017   Procedure: RIGHT TYMPANOMASTOIDECTOMY;  Surgeon: Leta Baptist, MD;  Location: [place];  Service: ENT;  Laterality: Right;    Allergies  Allergen Reactions  . Strawberry Extract Itching, Swelling and Anaphylaxis    Mouth swells and gets itchy    Current Outpatient Medications  Medication Sig Dispense Refill  . acetaminophen (TYLENOL 8 HOUR) 650 MG CR tablet Take 1 tablet (650 mg total) by mouth every 8 (eight) hours as needed for pain. 20 tablet 0  . aspirin EC 81 MG tablet Take 81 mg by mouth daily.    [name] atorvastatin (LIPITOR) 40 MG tablet Take 1 tablet (40 mg total) by mouth daily at 6 PM. 30 tablet 0  . clopidogrel (PLAVIX) 75 MG tablet Take 1 tablet (75 mg total) by mouth daily with breakfast. 90 tablet 3  . dexamethasone (DECADRON) 0.5 MG tablet Take 0.5 mg by mouth daily.     [name] doxycycline (VIBRA-TABS) 100 MG tablet Take 100 mg by mouth 2 (two) times daily.    [name] FARXIGA 10 MG TABS tablet Take 10 mg by mouth daily.     . ferrous sulfate 325 (65 FE) MG tablet Take 325 mg by mouth daily with breakfast.    . Insulin Glargine (BASAGLAR KWIKPEN) 100 UNIT/ML SOPN Inject 48 Units into the skin at bedtime. inject 52 units into skin at bedtime    . ketorolac (ACULAR) 0.5 % ophthalmic solution Place 1 drop into the right eye 4 (four) times daily.    [name]  levothyroxine (SYNTHROID, LEVOTHROID) 175 MCG tablet Take 175 mcg by mouth daily before breakfast.    . lisinopril (ZESTRIL) 10 MG tablet Take 10 mg by mouth daily.    . metoprolol tartrate (LOPRESSOR) 25 MG tablet TAKE 1 TABLET TWICE A DAY 180 tablet 2  . moxifloxacin (VIGAMOX) 0.5 % ophthalmic solution Apply 1 drop to eye 4 (four) times daily.    [name] NOVOLOG FLEXPEN 100 UNIT/ML FlexPen Inject 5-14 Units into the skin 3 (three) times daily with meals. Per sliding scale    . nystatin cream (MYCOSTATIN) Apply 1 application topically 2 (two) times daily. 30 g 1  . Omega-3 Fatty Acids (FISH OIL) 1000 MG CAPS  Take 1,000 mg by mouth daily.    . ondansetron (ZOFRAN) 4 MG tablet Take 1 tablet (4 mg total) by mouth every 8 (eight) hours as needed for nausea or vomiting. 20 tablet 1  . ONETOUCH VERIO test strip USE 1 STRIP TO CHECK GLUCOSE THREE TIMES DAILY  1  . pantoprazole (PROTONIX) 40 MG tablet Take 1 tablet (40 mg total) by mouth 2 (two) times daily before a meal. 60 tablet 0  . prednisoLONE acetate (PRED FORTE) 1 % ophthalmic suspension 1 drop 4 (four) times daily.     No current facility-administered medications for this visit.    Family History  Problem Relation Age of Onset  . Cancer Mother 56       multiple myeloma  . Heart attack Father 26  . Cancer Sister   . Alzheimer's disease Maternal Grandmother     Social History   Socioeconomic History  . Marital status: Widowed    Spouse name: Not on file  . Number of children: Not on file  . Years of education: Not on file  . Highest education level: Not on file  Occupational History  . Not on file  Tobacco Use  . Smoking status: Former Research scientist (life sciences)  . Smokeless tobacco: Never Used  Substance and Sexual Activity  . Alcohol use: No  . Drug use: No  . Sexual activity: Not Currently    Birth control/protection: Post-menopausal  Other Topics Concern  . Not on file  Social History Narrative   Widow. Works at [employer].   Former  smoker.   Overall not very active.  Hoping to get into water aerobics class.   Social Determinants of Health   Financial Resource Strain:   . Difficulty of Paying Living Expenses:   Food Insecurity:   . Worried About Charity fundraiser in the Last Year:   . Arboriculturist in the Last Year:   Transportation Needs:   . Film/video editor (Medical):   [name] Lack of Transportation (Non-Medical):   Physical Activity:   . Days of Exercise per Week:   . Minutes of Exercise per Session:   Stress:   . Feeling of Stress :   Social Connections:   . Frequency of Communication with Friends and Family:   . Frequency of Social Gatherings with Friends and Family:   . Attends Religious Services:   . Active Member of Clubs or Organizations:   . Attends Archivist Meetings:   [name] Marital Status:   Intimate Partner Violence:   . Fear of Current or Ex-Partner:   . Emotionally Abused:   [name] Physically Abused:   . Sexually Abused:      REVIEW OF SYSTEMS:   '[X]'  denotes positive finding, '[ ]'  denotes negative finding Cardiac  Comments:  Chest pain or chest pressure:    Shortness of breath upon exertion:    Short of breath when lying flat:    Irregular heart rhythm:        Vascular    Pain in calf, thigh, or hip brought on by ambulation:    Pain in feet at night that wakes you up from your sleep:     Blood clot in your veins:    Leg swelling:         Pulmonary    Oxygen at home:    Productive cough:     Wheezing:         Neurologic    Sudden  weakness in arms or legs:     Sudden numbness in arms or legs:     Sudden onset of difficulty speaking or slurred speech:    Temporary loss of vision in one eye:     Problems with dizziness:         Gastrointestinal    Blood in stool:     Vomited blood:         Genitourinary    Burning when urinating:     Blood in urine:        Psychiatric    Major depression:         Hematologic    Bleeding problems:    Problems with blood  clotting too easily:        Skin    Rashes or ulcers:        Constitutional    Fever or chills:      PHYSICAL EXAMINATION:  Today's Vitals   10/06/19 1405  BP: 127/68  Pulse: 87  Resp: 20  Temp: 97.8 F (36.6 C)  TempSrc: Temporal  SpO2: 100%  Weight: 217 lb 1.6 oz (98.5 kg)  Height: '5\' 7"'  (1.702 m)   Body mass index is 34 kg/m.   General:  WDWN in NAD; vital signs documented above Gait: Not observed HENT: WNL, normocephalic Pulmonary: normal non-labored breathing , without wheezing Cardiac: regular HR, without  Murmur; without carotid bruits Abdomen: soft, NT, no masses Skin: without rashes Vascular Exam/Pulses:  Right Left  Radial 2+ (normal) 2+ (normal)  Femoral 1+ (weak) Difficult to palpate due to body habitus  Popliteal Unable to palpate  Unable to palpate   DP Brisk monophasic Brisk monophasic  PT Faint monophasic Faint monophasic  Peroneal monophasic monophasic   Extremities: without ischemic changes, without Gangrene , without cellulitis; without open wounds; well healed 5th toe amputation site Musculoskeletal: no muscle wasting or atrophy  Neurologic: A&O X 3;  No focal weakness or paresthesias are detected Psychiatric:  The pt has Normal affect.   Non-Invasive Vascular Imaging:   ABI's/TBI's on 10/06/2019: Right:  0.53/0.33 - Great toe pressure: 48 Left:  0.64/0.51 - Great toe pressure: 74  Arterial duplex on 10/06/2019: +----------+--------+-----+--------+----------+--------+  RIGHT   PSV cm/sRatioStenosisWaveform Comments  +----------+--------+-----+--------+----------+--------+  SFA Mid  119          monophasic      +----------+--------+-----+--------+----------+--------+  SFA Distal369          monophasic      +----------+--------+-----+--------+----------+--------+   A focal velocity elevation of 369 cm/s was obtained at distal SFA with a  VR of 3.1. Limited imaging of right leg.    +----------+--------+-----+---------------+----------+--------+  LEFT   PSV cm/sRatioStenosis    Waveform Comments  +----------+--------+-----+---------------+----------+--------+  CFA Prox 119              triphasic       +----------+--------+-----+---------------+----------+--------+  DFA    144              biphasic       +----------+--------+-----+---------------+----------+--------+  SFA Prox 144              biphasic stent    +----------+--------+-----+---------------+----------+--------+  SFA Mid  381      50-74% stenosisbiphasic       +----------+--------+-----+---------------+----------+--------+  SFA Distal221              monophasic      +----------+--------+-----+---------------+----------+--------+  POP Prox 112  monophasic      +----------+--------+-----+---------------+----------+--------+  POP Distal98              monophasic      +----------+--------+-----+---------------+----------+--------+  ATA Distal88              biphasic       +----------+--------+-----+---------------+----------+--------+  PTA Distal46              monophasic      +----------+--------+-----+---------------+----------+--------+   A focal velocity elevation of 381 cm/s was obtained at mid sfa with post  stenotic turbulence with a VR of 2.8. Findings are characteristic of  50-74% stenosis.    Left Stent(s):  +---------------+--------+--------+----------+--------+  proximal SFA  PSV cm/sStenosisWaveform Comments  +---------------+--------+--------+----------+--------+  Prox to Stent 172       triphasic       +---------------+--------+--------+----------+--------+  Proximal Stent 215       biphasic        +---------------+--------+--------+----------+--------+  Mid Stent   154       monophasic      +---------------+--------+--------+----------+--------+  Distal Stent  151       monophasic      +---------------+--------+--------+----------+--------+  Distal to [id number]       monophasic      +---------------+--------+--------+----------+--------+   Summary:  Right: 50-74% stenosis noted in the superficial femoral artery.   Left: 50-74% stenosis noted in the superficial femoral artery. Patent  stent with no evidence of stenosis in the proximal sfa artery.   Previous ABI's/TBI's on 07/01/2019: Right:  0.55/0.53 - Great toe pressure: 63 Left:  0.78/0.43 - Great toe pressure:  51  Previous arterial duplex on 07/01/2019: LEFT   PSV cm/sRatioStenosis    Waveform Comments  +----------+--------+-----+---------------+----------+--------+  CFA Distal191              biphasic       +----------+--------+-----+---------------+----------+--------+  SFA Prox 162              biphasic       +----------+--------+-----+---------------+----------+--------+  SFA Mid  274      50-74% stenosismonophasic      +----------+--------+-----+---------------+----------+--------+  SFA Distal137              monophasic      +----------+--------+-----+---------------+----------+--------+  POP Prox 126              monophasic      +----------+--------+-----+---------------+----------+--------+  POP Distal87              monophasic      +----------+--------+-----+---------------+----------+--------+  ATA Distal93              biphasic       +----------+--------+-----+---------------+----------+--------+  PTA Distal48              monophasic       +----------+--------+-----+---------------+----------+--------+   A focal velocity elevation of 274 cm/s was obtained at mid SFA with post stenotic turbulence with a VR of 2.1. Findings are characteristic of  50-74% stenosis.    Left Stent(s):  +---------------+---++----------++  Prox to Stent 162biphasic   +---------------+---++----------++  Proximal Stent 172biphasic   +---------------+---++----------++  Mid Stent   4moophasic  +---------------+---++----------++  Distal Stent  148mophasic  +---------------+---++----------++  Distal to Stent1357mohasic  +---------------+---++----------++   Summary:  Left: 50-74% stenosis noted in the mid superficial femoral artery.  Proximal SFA stent is widely patent with no evidence of stenosis.   ASSESSMENT/PLAN:: [age]o. female here for follow up for PAD and is s/p  left SFA atherectomy with angioplasty including  drug-coated balloon and ultimately required a proximal left SFA stent with a 6 x 40 Luttonix for a focal dissection.  She had a patent popliteal artery with three-vessel runoff in the left lower extremity in March 2021 by [name]  -pt's ABI's are essentially unchanged with slight decrease on the left.  She does have an SFA stenosis bilaterally of 50-74%.  She is not having any claudication sx or non healing wounds.  Her wound on the left foot has completely healed.  Will have her return in 3 months for bilateral LE arterial duplex and ABI's.  If doing well at that time, will have her f/u in 6 months.  Pt is in agreement with this plan.  -she will return sooner if she has any issues with non healing wounds or rest pain.   -continue compression and elevation for leg swelling   Leontine Locket, [facility] [phone]  Clinic MD:   Oneida Alar

## 2019-10-12 ENCOUNTER — Other Ambulatory Visit: Payer: Self-pay | Admitting: *Deleted

## 2019-10-12 DIAGNOSIS — I739 Peripheral vascular disease, unspecified: Secondary | ICD-10-CM

## 2019-11-11 ENCOUNTER — Telehealth: Payer: Self-pay | Admitting: Cardiology

## 2019-11-11 NOTE — Telephone Encounter (Signed)
attempted to contact patient to get follow up scheduled with Ellyn Hack from recall list, the patient didn't answer and was unable to LVM, VM wasn't set up

## 2019-12-01 ENCOUNTER — Other Ambulatory Visit: Payer: Self-pay | Admitting: Cardiology

## 2019-12-01 MED ORDER — LISINOPRIL 10 MG PO TABS: 10.0000 mg | ORAL_TABLET | Freq: Every day | ORAL | 3 refills | Status: DC

## 2019-12-01 NOTE — Telephone Encounter (Signed)
° °*  STAT* If patient is at the pharmacy, call can be transferred to refill team.   1. Which medications need to be refilled? (please list name of each medication and dose if known)   lisinopril (ZESTRIL) 10 MG tablet  2. Which pharmacy/location (including street and city if local pharmacy) is medication to be sent to?  [place] ([place]), [place] - [address]   3. Do they need a 30 day or 90 day supply? Patient would like 90 days if possible

## 2019-12-07 ENCOUNTER — Telehealth: Payer: Self-pay | Admitting: Podiatry

## 2019-12-07 NOTE — Telephone Encounter (Signed)
Pt left message yesterday asking for status of diabetic shoes.  I returned call and spoke to pt and let her know we did receive the some of the paperwork but one page was missing and I faxed last week (9.8.2021) and again today with confirmation. I included a note asking if they could get a signature and fax back to me... She stated she was going to call there office.

## 2019-12-20 ENCOUNTER — Other Ambulatory Visit: Payer: Self-pay

## 2019-12-20 ENCOUNTER — Ambulatory Visit (INDEPENDENT_AMBULATORY_CARE_PROVIDER_SITE_OTHER): Payer: BC Managed Care – PPO | Admitting: Podiatry

## 2019-12-20 DIAGNOSIS — M79675 Pain in left toe(s): Secondary | ICD-10-CM | POA: Diagnosis not present

## 2019-12-20 DIAGNOSIS — E119 Type 2 diabetes mellitus without complications: Secondary | ICD-10-CM

## 2019-12-20 DIAGNOSIS — M79674 Pain in right toe(s): Secondary | ICD-10-CM | POA: Diagnosis not present

## 2019-12-20 DIAGNOSIS — B351 Tinea unguium: Secondary | ICD-10-CM

## 2019-12-22 NOTE — Progress Notes (Signed)
Subjective: Tammy Good is a 69 y.o. 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  She states that the incision is well-healed.  She still awaiting diabetic shoes.  Her nails are thickened elongated she cannot trim them herself.  She has no open sores that she reports and she has no other concerns today. Denies any fevers, chills, nausea, vomiting.  No calf pain, chest pain or shortness of breath.  Objective: General: No acute distress, AAOx3  DP/PT pulses palpable LEFT foot: Incision appears to be well-healed at this time.  There is no open lesions identified bilaterally. Nails are hypertrophic, dystrophic, brittle, discolored, elongated 10. No surrounding redness or drainage. Tenderness nails 1-5 on the right and 1 through 4 on the left.  Incurvation present to the right hallux toenail.  No open lesions or pre-ulcerative lesions are identified today. No pain with calf compression, swelling, warmth, erythema.   Assessment and Plan:  Status post left foot surgery, doing well with no complications   -Treatment options discussed including all alternatives, risks, and complications -At this point the incision site is well-healed.  She is awaiting diabetic shoes and upon tracking that they should arrive tomorrow.  We will schedule her Monday to pick up the diabetic shoes. -Debride the nails x9 out complications or bleeding.  Incurvation of the right hallux toenail.  Monitor this for any signs or symptoms of infection -Discussed daily foot inspection  Trula Slade DPM

## 2019-12-26 ENCOUNTER — Ambulatory Visit (INDEPENDENT_AMBULATORY_CARE_PROVIDER_SITE_OTHER): Payer: BC Managed Care – PPO | Admitting: Orthotics

## 2019-12-26 ENCOUNTER — Other Ambulatory Visit: Payer: Self-pay

## 2019-12-26 DIAGNOSIS — M869 Osteomyelitis, unspecified: Secondary | ICD-10-CM

## 2019-12-26 DIAGNOSIS — E119 Type 2 diabetes mellitus without complications: Secondary | ICD-10-CM

## 2019-12-26 NOTE — Progress Notes (Signed)

## 2020-01-03 ENCOUNTER — Other Ambulatory Visit: Payer: Self-pay

## 2020-01-03 ENCOUNTER — Ambulatory Visit (HOSPITAL_COMMUNITY)
Admission: RE | Admit: 2020-01-03 | Discharge: 2020-01-03 | Disposition: A | Discharge status: Home / Self Care | Payer: BC Managed Care – PPO | Source: Ambulatory Visit | Attending: Physician Assistant | Admitting: Physician Assistant

## 2020-01-03 ENCOUNTER — Ambulatory Visit (INDEPENDENT_AMBULATORY_CARE_PROVIDER_SITE_OTHER)
Admission: RE | Admit: 2020-01-03 | Discharge: 2020-01-03 | Disposition: A | Discharge status: Home / Self Care | Payer: BC Managed Care – PPO | Source: Ambulatory Visit | Attending: Physician Assistant | Admitting: Physician Assistant

## 2020-01-03 ENCOUNTER — Ambulatory Visit: Payer: BC Managed Care – PPO | Admitting: Physician Assistant

## 2020-01-03 ENCOUNTER — Other Ambulatory Visit: Payer: Self-pay | Admitting: Physician Assistant

## 2020-01-03 VITALS — BP 163/69 | HR 67 | Temp 98.5°F | Resp 20 | Ht 67.0 in | Wt 224.4 lb

## 2020-01-03 DIAGNOSIS — L02612 Cutaneous abscess of left foot: Secondary | ICD-10-CM

## 2020-01-03 DIAGNOSIS — I739 Peripheral vascular disease, unspecified: Secondary | ICD-10-CM

## 2020-01-03 DIAGNOSIS — L03032 Cellulitis of left toe: Secondary | ICD-10-CM

## 2020-01-03 NOTE — Progress Notes (Signed)
Established PAD Patient   History of Present Illness   Tammy Good is a 69 y.o. (1950-12-02) female who presents to go over vascular studies related to PAD.  Surgical history significant for left fifth ray amputation by podiatrist Dr. Earleen Newport on 05/25/2019.  On the following day she underwent left SFA atherectomy with placement of proximal SFA stent by [name] on 05/26/2019.  She has since healed her fifth toe ray amputation.  She also denies any claudication, rest pain, or further tissue changes.  She is on an aspirin, Plavix, and statin daily.  She denies tobacco use.  Past medical history also significant for-insulin-dependent diabetes mellitus.  The patient's PMH, PSH, SH, and FamHx were reviewed and are unchanged from prior visit.  Current Outpatient Medications  Medication Sig Dispense Refill  . aspirin EC 81 MG tablet Take 81 mg by mouth daily.    [name] atorvastatin (LIPITOR) 40 MG tablet Take 1 tablet (40 mg total) by mouth daily at 6 PM. 30 tablet 0  . clopidogrel (PLAVIX) 75 MG tablet Take 1 tablet (75 mg total) by mouth daily with breakfast. 90 tablet 3  . dexamethasone (DECADRON) 0.5 MG tablet Take 0.5 mg by mouth daily.     [name] FARXIGA 10 MG TABS tablet Take 10 mg by mouth daily.     . ferrous sulfate 325 (65 FE) MG tablet Take 325 mg by mouth daily with breakfast.    . Insulin Glargine (BASAGLAR KWIKPEN) 100 UNIT/ML SOPN Inject 48 Units into the skin at bedtime. inject 52 units into skin at bedtime    . levothyroxine (SYNTHROID, LEVOTHROID) 175 MCG tablet Take 175 mcg by mouth daily before breakfast.    . lisinopril (ZESTRIL) 10 MG tablet Take 1 tablet (10 mg total) by mouth daily. 90 tablet 3  . metoprolol tartrate (LOPRESSOR) 25 MG tablet TAKE 1 TABLET TWICE A DAY 180 tablet 2  . NOVOLOG FLEXPEN 100 UNIT/ML FlexPen Inject 5-14 Units into the skin 3 (three) times daily with meals. Per sliding scale    . nystatin cream (MYCOSTATIN) Apply 1 application topically 2 (two) times  daily. 30 g 1  . Omega-3 Fatty Acids (FISH OIL) 1000 MG CAPS Take 1,000 mg by mouth daily.    Glory Rosebush VERIO test strip USE 1 STRIP TO CHECK GLUCOSE THREE TIMES DAILY  1  . pantoprazole (PROTONIX) 40 MG tablet Take 1 tablet (40 mg total) by mouth 2 (two) times daily before a meal. 60 tablet 0   No current facility-administered medications for this visit.    REVIEW OF SYSTEMS (negative unless checked):   Cardiac:  []  Chest pain or chest pressure? []  Shortness of breath upon activity? []  Shortness of breath when lying flat? []  Irregular heart rhythm?  Vascular:  []  Pain in calf, thigh, or hip brought on by walking? []  Pain in feet at night that wakes you up from your sleep? []  Blood clot in your veins? []  Leg swelling?  Pulmonary:  []  Oxygen at home? []  Productive cough? []  Wheezing?  Neurologic:  []  Sudden weakness in arms or legs? []  Sudden numbness in arms or legs? []  Sudden onset of difficult speaking or slurred speech? []  Temporary loss of vision in one eye? []  Problems with dizziness?  Gastrointestinal:  []  Blood in stool? []  Vomited blood?  Genitourinary:  []  Burning when urinating? []  Blood in urine?  Psychiatric:  []  Major depression  Hematologic:  []  Bleeding problems? []  Problems with blood clotting?  Dermatologic:  []   Rashes or ulcers?  Constitutional:  []  Fever or chills?  Ear/Nose/Throat:  []  Change in hearing? []  Nose bleeds? []  Sore throat?  Musculoskeletal:  []  Back pain? []  Joint pain? []  Muscle pain?   Physical Examination   Vitals:   01/03/20 1050  BP: (!) 163/69  Pulse: 67  Resp: 20  Temp: 98.5 F (36.9 C)  TempSrc: Temporal  SpO2: 100%  Weight: 224 lb 6.4 oz (101.8 kg)  Height: 5\' 7"  (1.702 m)   Body mass index is 35.15 kg/m.  General:  WDWN in NAD; vital signs documented above Gait: Not observed HENT: WNL, normocephalic Pulmonary: normal non-labored breathing Cardiac: regular HR Abdomen: soft, NT, no  masses Skin: without rashes Vascular Exam/Pulses:  Right Left  Radial 2+ (normal) 2+ (normal)  DP absent absent  PT absent absent   Extremities: without ischemic changes, without Gangrene , without cellulitis; without open wounds; well-healed left fifth toe ray amputation Musculoskeletal: no muscle wasting or atrophy  Neurologic: A&O X 3;  No focal weakness or paresthesias are detected Psychiatric:  The pt has Normal affect.  Non-Invasive Vascular Imaging   ABI  ABI/TBIToday's ABIToday's TBIPrevious ABIPrevious TBI  +-------+-----------+-----------+------------+------------+  Right 0.68    0.58    0.53    0.33      +-------+-----------+-----------+------------+------------+  Left  0.80    0.68    0.64    0.51      Right mid SFA 228 cm/s  Left proximal SFA stent patent Left mid SFA 329 cm/s   Medical Decision Making    Tammy Good is a 69 y.o. female who presents for surveillance of PAD of bilateral lower extremities  -Left fifth toe ray amputation well-healed -Based on arterial duplex proximal left SFA stent is patent however mid SFA has a lesion with elevated velocities of 329 cm/s -Patient is asymptomatic without claudication, rest pain, or further tissue changes however we will continue short order follow-up with repeat imaging in 3 months -Continue aspirin, Plavix, and statin daily -This case was discussed with [name] who recommended continued 27-month follow-up based on imaging study   Dagoberto Ligas PA-C [facility] Office: [phone]

## 2020-01-09 ENCOUNTER — Other Ambulatory Visit: Payer: Self-pay | Admitting: *Deleted

## 2020-01-09 DIAGNOSIS — I739 Peripheral vascular disease, unspecified: Secondary | ICD-10-CM

## 2020-01-09 DIAGNOSIS — L02612 Cutaneous abscess of left foot: Secondary | ICD-10-CM

## 2020-03-01 ENCOUNTER — Encounter: Payer: Self-pay | Admitting: Cardiology

## 2020-03-01 ENCOUNTER — Other Ambulatory Visit: Payer: Self-pay

## 2020-03-01 ENCOUNTER — Ambulatory Visit (INDEPENDENT_AMBULATORY_CARE_PROVIDER_SITE_OTHER): Payer: BC Managed Care – PPO | Admitting: Cardiology

## 2020-03-01 VITALS — BP 146/72 | HR 55 | Ht 67.0 in | Wt 226.0 lb

## 2020-03-01 DIAGNOSIS — Z9861 Coronary angioplasty status: Secondary | ICD-10-CM | POA: Diagnosis not present

## 2020-03-01 DIAGNOSIS — I739 Peripheral vascular disease, unspecified: Secondary | ICD-10-CM

## 2020-03-01 DIAGNOSIS — I2510 Atherosclerotic heart disease of native coronary artery without angina pectoris: Secondary | ICD-10-CM | POA: Diagnosis not present

## 2020-03-01 DIAGNOSIS — I2119 ST elevation (STEMI) myocardial infarction involving other coronary artery of inferior wall: Secondary | ICD-10-CM

## 2020-03-01 DIAGNOSIS — E669 Obesity, unspecified: Secondary | ICD-10-CM

## 2020-03-01 DIAGNOSIS — E1169 Type 2 diabetes mellitus with other specified complication: Secondary | ICD-10-CM

## 2020-03-01 DIAGNOSIS — I10 Essential (primary) hypertension: Secondary | ICD-10-CM | POA: Diagnosis not present

## 2020-03-01 DIAGNOSIS — E785 Hyperlipidemia, unspecified: Secondary | ICD-10-CM

## 2020-03-01 DIAGNOSIS — Z955 Presence of coronary angioplasty implant and graft: Secondary | ICD-10-CM

## 2020-03-01 NOTE — Patient Instructions (Signed)
Medication Instructions:  No changes *If you need a refill on your cardiac medications before your next appointment, please call your pharmacy*   Lab Work: Not needed  If you have labs (blood work) drawn today and your tests are completely normal, you will receive your results only by: [name] MyChart Message (if you have MyChart) OR . A paper copy in the mail If you have any lab test that is abnormal or we need to change your treatment, we will call you to review the results.   Testing/Procedures: Not needed   Follow-Up: At [facility], you and your health needs are our priority.  As part of our continuing mission to provide you with exceptional heart care, we have created designated Provider Care Teams.  These Care Teams include your primary Cardiologist (physician) and Advanced Practice Providers (APPs -  Physician Assistants and Nurse Practitioners) who all work together to provide you with the care you need, when you need it.     Your next appointment:   6 month(s)  The format for your next appointment:   In Person  Provider:   Glenetta Hew, MD   Other Instructions  Check random blood pressure - record  Log   Range should be 135/80  No higher than 140 /80

## 2020-03-01 NOTE — Progress Notes (Signed)
Primary Care Provider: [name], MD Cardiologist: Glenetta Hew, MD Electrophysiologist: None  Vascular Surgery: Monica Martinez, MD  Podiatry: Trula Slade., DPM  Clinic Note: Chief Complaint  Patient presents with  . Coronary Artery Disease    S/p Inf STEMI - PCI RCA  . Follow-up    Annual (delayed) - Problem List Below   Problem List Items Addressed This Visit    CAD S/P percutaneous coronary angioplasty - Primary (Chronic) / Status post insertion of drug eluting coronary artery stent to [facility] emergently and PTCA to prox. PLA (Chronic)   ST elevation myocardial infarction (STEMI) of inferior wall, subsequent episode of care (HCC) (Chronic)   Obesity (BMI 30-39.9) (Chronic)   Hyperlipidemia LDL goal <70 (Chronic)   Essential hypertension (Chronic)   PAD (peripheral artery disease) (HCC) (Chronic) - s/p L SFA atherectomy - PTA w/ DEB & Stent 6 x 40 Luttonix (for focal dissection) - patent Pop A with 3 V runoff.     HPI:    Tammy Good is a 69 y.o. female with a PMH (CAD-PCI for Inf STEMI, PAD, DM-2, HTN/HLD & Panhypopituitarism) who presents today for delayed 43-month follow-up   Inferior STEMI June 2015 (PCI to RCA with PTCA of PDA -> Integrity Resolute 3.0 mmx 38 mm -> 3.35 mm) - PTCA of PAV/PLA  EF ~55%, inferior HK with Gr 1 DD  Tammy Good was last seen by me on December 22, 2018 for annual follow-up.  Somewhat delayed because of ear surgery.  Was spent a long time sitting-working from home.  Mild edema, no night.  Notably less activity with COVID-19.  Not able to rejoin the gym.  Mild edema noted. -> Added HCTZ 25 mg; discussed weight loss; discussed leg exercises-dorsiflexion and extension of feet and foot elevation.  Also support stockings.  Recent Hospitalizations:   3/1-10/2019: Admitted for subacute presentation of left foot osteomyelitis -> she had developed a blister which turned to cellulitis on the left fifth toe --> failed  outpatient management,  X-ray suggested osteomyelitis, MRI showed septic arthritis of the fifth metatarsophalangeal joint and advanced osteomyelitis involving fifth metatarsal and fifth proximal phalanx with cellulitis and myositis of left foot. -> MRSA;  05/25/2019: (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  Abx x 6 wks (Vancomycin)   She was seen by Bunnie Domino, NP on June 27, 2019 for hospital follow-up.  She felt tired and worn out but no appetite.  Some exertional dyspnea.  Not hungry, trying to force fluids down.  Some dizziness.  Walking with a cane. ->  Noted to be hypotensive with creatinine of 2.66.    HCTZ discontinued and lisinopril dose reduced to 10 mg. --> Seen in follow-up on 07/27/2019.  Was feeling significantly better.  Energy level improved.  No further dizziness or fatigue.  No chest pain.  Blood pressure stable.  Therefore her medications not titrated back up.  --> ER visit on 09/13/2019 after a fall-left knee injury, tripped on her shoes.  Had a deep laceration proximal to the tib-fib area on the left leg.  No fractures.  Wound dressed.  Placed on Keflex.  Referred to [facility]  Reviewed  CV studies:    The following  studies were reviewed today: (if available, images/films reviewed: From Epic Chart or Care Everywhere) . Carotid Dopplers 08/04/2019: Near normal extracranial carotids.  Antegrade bilateral vertebral artery flow with normal subclavian flow. . LEA Dopplers 01/03/2020: RABI (prev) 0.68 (0.53)/ RTBI (prev) 0.58 (0.33); LABI (prev) 0.80 (0.64), LTBI (prev) 0.64 (0.51); R mSFA ~50-74%, L mSFA 50-74%. Patent Prox SFA stent < 49% stenosis  Interval History:   Tammy Good returns today for essentially 26-month follow-up indicate  that she is doing very well.  She is not really having any claudication symptoms now.  Her energy level is pretty much back to baseline.  The foot seems to be healing well.  She is just trying to learn how to walk with her orthotics to maintain balance.  She definitely has not gotten back into her routine level of exercise activity, but is gradually building up.  Overall, she is in great spirits.  She is joking.  She does acknowledge that she is gained weight and needs to lose some.  CV Review of Symptoms (Summary): positive for - Some exertional dyspnea, muscular deconditioning and obesity.  Trivial edema. negative for - chest pain, irregular heartbeat, orthopnea, palpitations, paroxysmal nocturnal dyspnea, rapid heart rate, shortness of breath or Syncope/near syncope or TIA/amaurosis fugax, claudication  The patient does not have symptoms concerning for COVID-19 infection (fever, chills, cough, or new shortness of breath).   REVIEWED OF SYSTEMS   Review of Systems  Constitutional: Negative for malaise/fatigue (Energy notably improved.) and weight loss (Regained some of the weight that she had lost.).  HENT: Negative for congestion and nosebleeds.   Respiratory: Negative for cough and shortness of breath.   Cardiovascular: Negative for claudication.  Gastrointestinal: Negative for abdominal pain, blood in stool and melena.  Genitourinary: Negative for hematuria.  Musculoskeletal: Positive for falls (Not since June). Negative for joint pain.  Neurological: Negative for dizziness.  Psychiatric/Behavioral: Negative for depression and memory loss. The patient is not nervous/anxious and does not have insomnia.    I have reviewed and (if needed) personally updated the patient's problem list, medications, allergies, past medical and surgical history, social and family history.   PAST MEDICAL HISTORY   Past Medical History:  Diagnosis Date  . CAD S/P percutaneous coronary angioplasty 08/2013    100% mRCA - PCI Integrity Resolute DES 3.0 mm x 38 mm - 3.35 mm; PTCA of RPA V 2.0 mm x 15 mm  . Cholesteatoma    right  . Diabetes mellitus type 2 in obese (HCC)    On insulin and Invokana  . History of osteomyelitis L 5th Toe all 05/2019   s/p Partial Ray Amputation with partial closure; 6 wks Abx & LSFA Atherectomy/DEB PTA with Stent for focal dissection.  . Hyperlipidemia with target LDL less than 70   . Hypothyroidism (acquired)   . Mild essential hypertension   . Obesity (BMI 30-39.9) 11/17/2013  . PAD (peripheral artery disease) ([place]) 05/26/2019   05/26/19: Abd AoGram- BLE runoff -> L SFA orbital atherectomy - PTA w/ DEB & Stent 6 x 40 Luttonix (for focal dissection) - patent Pop A with 3 V runoff. LEA Dopplers 01/03/2020: RABI (prev) 0.68 (0.53)/ RTBI (prev) 0.58 (0.33); LABI (prev) 0.80 (0.64), LTBI (prev) 0.64 (0.51); R mSFA ~50-74%, L mSFA 50-74%. Patent Prox SFA stent < 49% stenosis  . Panhypopituitarism ([place])   . ST elevation myocardial infarction (STEMI) of inferior wall, subsequent episode of care ([place]) 08/2013   80% branch of D1, 40% mid AV  groove circumflex, 100% RCA with subacute thrombus -- thrombus extending into RPA V with 100% occlusion after initial angioplasty of mid RCA ;; Post MI ECHO 6/9/'15: EF 50-55%, mild LVH with moderate HK of inferior wall, Gr1 DD, mild LA dilation; mildly reduced RV function    PAST SURGICAL HISTORY   Past Surgical History:  Procedure Laterality Date  . ABDOMINAL AORTOGRAM W/LOWER EXTREMITY N/A 05/26/2019   Procedure: ABDOMINAL AORTOGRAM W/LOWER EXTREMITY;  Surgeon: Marty Heck, MD;  Location: [place] CV LAB;  Service: Cardiovascular;  Laterality: N/A;  . AMPUTATION Left 05/25/2019   Procedure: AMPUTATION RAY 5th;  Surgeon: Trula Slade, DPM;  Location: [place];  Service: Podiatry;  Laterality: Left;  . Cardiac Event Monitor  July-August 2015   Sinus rhythm with PVCs  . COLONOSCOPY N/A 08/31/2013   Procedure: COLONOSCOPY;  Surgeon:  Juanita Craver, MD;  Location: [facility] ENDOSCOPY;  Service: Endoscopy;  Laterality: N/A;  . ESOPHAGOGASTRODUODENOSCOPY N/A 09/01/2013   Procedure: ESOPHAGOGASTRODUODENOSCOPY (EGD);  Surgeon: Beryle Beams, MD;  Location: [facility] ENDOSCOPY;  Service: Endoscopy;  Laterality: N/A;  bedside  . LEFT HEART CATHETERIZATION WITH CORONARY ANGIOGRAM N/A 08/30/2013   Procedure: LEFT HEART CATHETERIZATION WITH CORONARY ANGIOGRAM;  Surgeon: Leonie Man, MD;  Location: [facility] CATH LAB: 100% mRCA (thrombus - extends to RPAV), 80% D1, 40% AVG Cx.  [name] PERCUTANEOUS CORONARY STENT INTERVENTION (PCI-S)  08/30/2013   Procedure: PERCUTANEOUS CORONARY STENT INTERVENTION (PCI-S);  Surgeon: Leonie Man, MD;  Location: [facility] CATH LAB;  Integrity Resolute DES 2.0 mm x 38 mm -- 3.35 mm.; PTCA of proximal RPA V. - 2.0 mm x 15 mm balloon  . PERIPHERAL VASCULAR ATHERECTOMY  05/26/2019   Procedure: PERIPHERAL VASCULAR ATHERECTOMY;  Surgeon: Marty Heck, MD;  Location: [place] CV LAB;  Service: Cardiovascular;;  Left SFA  . PERIPHERAL VASCULAR INTERVENTION  05/26/2019   Procedure: PERIPHERAL VASCULAR INTERVENTION;  Surgeon: Marty Heck, MD;  Location: [place] CV LAB;  Service: Cardiovascular;;  Left SFA  . PITUITARY SURGERY    . TRANSTHORACIC ECHOCARDIOGRAM  08/30/2013   mild LVH. EF 50-55%. Moderate HK of the entire inferior myocardium. GR 1 DD. Mild LA dilation. Mildly reduced RV function  . TYMPANOMASTOIDECTOMY Right 12/28/2017   Procedure: RIGHT TYMPANOMASTOIDECTOMY;  Surgeon: Leta Baptist, MD;  Location: [place];  Service: ENT;  Laterality: Right;    Immunization History  Administered Date(s) Administered  . Moderna Sars-Covid-2 Vaccination 06/29/2019, 07/27/2019  . Tdap 09/13/2019    MEDICATIONS/ALLERGIES   Current Meds  Medication Sig  . aspirin EC 81 MG tablet Take 81 mg by mouth daily.  [name] atorvastatin (LIPITOR) 40 MG tablet Take 1 tablet (40 mg total) by mouth daily at 6 PM.  . clopidogrel  (PLAVIX) 75 MG tablet Take 1 tablet (75 mg total) by mouth daily with breakfast.  . dexamethasone (DECADRON) 0.5 MG tablet Take 0.5 mg by mouth daily.  [name] FARXIGA 10 MG TABS tablet Take 10 mg by mouth daily.   . ferrous sulfate 325 (65 FE) MG tablet Take 325 mg by mouth daily with breakfast.  . Insulin Glargine (BASAGLAR KWIKPEN) 100 UNIT/ML SOPN Inject 48 Units into the skin at bedtime. inject 52 units into skin at bedtime  . levothyroxine (SYNTHROID, LEVOTHROID) 175 MCG tablet Take 175 mcg by mouth daily before breakfast.  . lisinopril (ZESTRIL) 10 MG tablet Take 1 tablet (10 mg total) by mouth daily.  . metoprolol tartrate (LOPRESSOR) 25 MG tablet TAKE 1 TABLET TWICE  A DAY  . NOVOLOG FLEXPEN 100 UNIT/ML FlexPen Inject 5-14 Units into the skin 3 (three) times daily with meals. Per sliding scale  . nystatin cream (MYCOSTATIN) Apply 1 application topically 2 (two) times daily.  . Omega-3 Fatty Acids (FISH OIL) 1000 MG CAPS Take 1,000 mg by mouth daily.  Glory Rosebush VERIO test strip USE 1 STRIP TO CHECK GLUCOSE THREE TIMES DAILY  . pantoprazole (PROTONIX) 40 MG tablet Take 1 tablet (40 mg total) by mouth 2 (two) times daily before a meal.    Allergies  Allergen Reactions  . 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    OBJCTIVE -PE, EKG, labs   Wt Readings from Last 3 Encounters:  03/01/20 226 lb (102.5 kg)  01/03/20 224 lb 6.4 oz (101.8 kg)  10/06/19 217 lb 1.6 oz (98.5 kg)  12/22/2018: 242 lb 3.2 oz (109.9 kg)  Physical Exam: BP (!) 146/72   Pulse (!) 55   Ht 5\' 7"  (1.702 m)   Wt 226 lb (102.5 kg)   BMI 35.40 kg/m  Physical Exam Vitals reviewed.  Constitutional:      General: She is not in acute distress.    Appearance: Normal appearance. She is obese. She is not ill-appearing or  toxic-appearing.  HENT:     Head: Normocephalic and atraumatic.  Neck:     Vascular: No carotid bruit, hepatojugular reflux or JVD.  Cardiovascular:     Rate and Rhythm: Normal rate and regular rhythm.  No extrasystoles are present.    Chest Wall: PMI is not displaced (Unable to palpate).     Pulses: Decreased pulses (Decreased with palpable pulses.).     Heart sounds: S1 normal and S2 normal. Heart sounds are distant. No murmur heard. No gallop.   Pulmonary:     Effort: Pulmonary effort is normal. No respiratory distress.     Breath sounds: Normal breath sounds.  Chest:     Chest wall: No tenderness.  Abdominal:     General: Bowel sounds are normal. There is no distension.     Palpations: Abdomen is soft. There is no mass.     Comments: No HSM  Musculoskeletal:        General: Swelling (Trivial ankle edema.) present. Normal range of motion.     Cervical back: Normal range of motion and neck supple.  Skin:    General: Skin is warm and dry.  Neurological:     General: No focal deficit present.     Mental Status: She is alert and oriented to person, place, and time.  Psychiatric:        Mood and Affect: Mood normal.        Behavior: Behavior normal.        Thought Content: Thought content normal.        Judgment: Judgment normal.     Adult ECG Report  Rate: 55 ;  Rhythm: normal sinus rhythm and Low voltage.  Incomplete RBBB.  Inferior MI, age indeterminate.  Anterior MI, age undetermined.;   Narrative Interpretation: Stable EKG.  Recent Labs:    10/31/2019: TC 111, TG 87, HDL 42 LDL 52.  Hgb 11.2.  A1c 6.0. Cr 0.8, K+ 4.5 No results found for: CHOL, HDL, LDLCALC, LDLDIRECT, TRIG, CHOLHDL Lab Results  Component Value Date  CREATININE 2.66 (H) 06/23/2019   BUN 40 (H) 06/23/2019   NA 137 06/23/2019   K 4.5 06/23/2019   CL 104 06/23/2019   CO2 23 06/23/2019   No results found for: TSH  ASSESSMENT/PLAN    Problem List Items Addressed This Visit    PAD (peripheral  artery disease) ([place]) (Chronic)    Currently followed by vascular surgery.  Now back on aspirin and Plavix. No claudication.  Continue current Cardiovascular Risk Factor Reduction   On aspirin Plavix  Intermediate-dose statin with adequate lipid control  Borderline blood pressure control (medications have been reduced due to fatigue, hypotension and elevated Cr)      Hyperlipidemia associated with type 2 diabetes mellitus ([place]) (Chronic)    Lipids from August show outstanding control with LDL 52.  Continue current dose of atorvastatin.  On insulin along with [place].      Essential hypertension (Chronic)    BP is little high today.  I asked her to monitor her blood pressure between now and her PCP follow-up.  Anticipate that she may need to increase her lisinopril back to 20 mg if elevated.  With hold off restarting diuretic until ACE-I titrated back up.      Relevant Orders   EKG 12-Lead (Completed)   Obesity (BMI 30-39.9) (Chronic)    Although she gained back to the weight she lost postoperatively, she is still notably down from when I last saw her.  I congratulated her on this effort.  Continue dietary modification with increased exercise.  Consider GLP 2 agonist for additional diabetes control and weight loss.      ST elevation myocardial infarction (STEMI) of inferior wall, subsequent episode of care [facility]) (Chronic)    She is now 6.5 years out from her MI with RCA PCI.  EKG and echo both confirm presence of prior infarct (mild diffuse inferior hypokinesis on echo).  No active angina or heart failure symptoms.  On stable regimen.      Relevant Orders   EKG 12-Lead (Completed)   CAD S/P percutaneous coronary angioplasty - Primary (Chronic)    She did have a prolonged stent placed in the RCA as well as PTCA to distal RCA-PLA in 2015.  She has been on Brilinta, and then on aspirin alone.  Is now back on aspirin plus Plavix because of PAD. -> Recommendations as far  as holding Plavix for surgery as it would be per vascular surgery ([name])   Continue low-dose beta-blocker, ACE inhibitor, statin, Wilder Glade as well as antiplatelet agent as noted.      Relevant Orders   EKG 12-Lead (Completed)   Atherosclerotic heart disease of native coronary artery without angina pectoris (Chronic)    No active angina.  On stable regimen as noted.          COVID-19 Education: The signs and symptoms of COVID-19 were discussed with the patient and how to seek care for testing (follow up with PCP or arrange E-visit).   The importance of social distancing and COVID-19 vaccination was discussed today. 1 min The patient is practicing social distancing & Masking.   I spent a total of 36 minutes with the patient spent in direct patient consultation.  Additional time spent with chart review  / charting (studies, outside notes, etc): 14 min - > patient has had hospitalization several clinic visits since I last saw her. Total Time: 50 min   Current medicines are reviewed at length with the patient today.  (+/- concerns) n/a  This visit occurred during the SARS-CoV-2 public health emergency.  Safety protocols were in place, including screening questions prior to the visit, additional usage of staff PPE, and extensive cleaning of exam room while observing appropriate contact time as indicated for disinfecting solutions.  Notice: This dictation was prepared with Dragon dictation along with smaller phrase technology. Any transcriptional errors that result from this process are unintentional and may not be corrected upon review.  Patient Instructions / Medication Changes & Studies & Tests Ordered   Patient Instructions  Medication Instructions:  No changes *If you need a refill on your cardiac medications before your next appointment, please call your pharmacy*   Lab Work: Not needed  If you have labs (blood work) drawn today and your tests are completely normal, you  will receive your results only by: [name] MyChart Message (if you have MyChart) OR . A paper copy in the mail If you have any lab test that is abnormal or we need to change your treatment, we will call you to review the results.   Testing/Procedures: Not needed   Follow-Up: At [facility], you and your health needs are our priority.  As part of our continuing mission to provide you with exceptional heart care, we have created designated Provider Care Teams.  These Care Teams include your primary Cardiologist (physician) and Advanced Practice Providers (APPs -  Physician Assistants and Nurse Practitioners) who all work together to provide you with the care you need, when you need it.     Your next appointment:   6 month(s)  The format for your next appointment:   In Person  Provider:   Glenetta Hew, MD   Other Instructions  Check random blood pressure - record  Log   Range should be 135/80  No higher than 140 /80      Studies Ordered:   Orders Placed This Encounter  Procedures  . EKG 12-Lead     Glenetta Hew, M.D., M.S. Interventional Cardiologist   Pager # [phone] Phone # [phone] [address] [place], [place] [address]   Thank you for choosing [facility] at [facility]!!

## 2020-03-20 ENCOUNTER — Other Ambulatory Visit: Payer: Self-pay

## 2020-03-20 ENCOUNTER — Ambulatory Visit (INDEPENDENT_AMBULATORY_CARE_PROVIDER_SITE_OTHER): Payer: BC Managed Care – PPO | Admitting: Podiatry

## 2020-03-20 ENCOUNTER — Encounter: Payer: Self-pay | Admitting: Cardiology

## 2020-03-20 DIAGNOSIS — E119 Type 2 diabetes mellitus without complications: Secondary | ICD-10-CM

## 2020-03-20 DIAGNOSIS — M79675 Pain in left toe(s): Secondary | ICD-10-CM

## 2020-03-20 DIAGNOSIS — M79674 Pain in right toe(s): Secondary | ICD-10-CM | POA: Diagnosis not present

## 2020-03-20 DIAGNOSIS — R112 Nausea with vomiting, unspecified: Secondary | ICD-10-CM | POA: Insufficient documentation

## 2020-03-20 DIAGNOSIS — B351 Tinea unguium: Secondary | ICD-10-CM

## 2020-03-20 DIAGNOSIS — Q828 Other specified congenital malformations of skin: Secondary | ICD-10-CM

## 2020-03-20 DIAGNOSIS — R1013 Epigastric pain: Secondary | ICD-10-CM | POA: Insufficient documentation

## 2020-03-20 DIAGNOSIS — R110 Nausea: Secondary | ICD-10-CM | POA: Insufficient documentation

## 2020-03-20 DIAGNOSIS — I2510 Atherosclerotic heart disease of native coronary artery without angina pectoris: Secondary | ICD-10-CM | POA: Insufficient documentation

## 2020-03-20 NOTE — Assessment & Plan Note (Addendum)
Lipids from August show outstanding control with LDL 52.  Continue current dose of atorvastatin.  On insulin along with [place].

## 2020-03-20 NOTE — Assessment & Plan Note (Signed)
She did have a prolonged stent placed in the RCA as well as PTCA to distal RCA-PLA in 2015.  She has been on Brilinta, and then on aspirin alone.  Is now back on aspirin plus Plavix because of PAD. -> Recommendations as far as holding Plavix for surgery as it would be per vascular surgery (Dr. Chestine Spore)   Continue low-dose beta-blocker, ACE inhibitor, statin, Marcelline Deist as well as antiplatelet agent as noted.

## 2020-03-20 NOTE — Assessment & Plan Note (Signed)
BP is little high today.  I asked her to monitor her blood pressure between now and her PCP follow-up.  Anticipate that she may need to increase her lisinopril back to 20 mg if elevated.  With hold off restarting diuretic until ACE-I titrated back up.

## 2020-03-20 NOTE — Assessment & Plan Note (Signed)
Currently followed by vascular surgery.  Now back on aspirin and Plavix. No claudication.  Continue current Cardiovascular Risk Factor Reduction   On aspirin Plavix  Intermediate-dose statin with adequate lipid control  Borderline blood pressure control (medications have been reduced due to fatigue, hypotension and elevated Cr)

## 2020-03-20 NOTE — Assessment & Plan Note (Signed)
Although she gained back to the weight she lost postoperatively, she is still notably down from when I last saw her.  I congratulated her on this effort.  Continue dietary modification with increased exercise.  Consider GLP 2 agonist for additional diabetes control and weight loss.

## 2020-03-20 NOTE — Assessment & Plan Note (Signed)
She is now 6.5 years out from her MI with RCA PCI.  EKG and echo both confirm presence of prior infarct (mild diffuse inferior hypokinesis on echo).  No active angina or heart failure symptoms.  On stable regimen.

## 2020-03-20 NOTE — Assessment & Plan Note (Signed)
No active angina.  On stable regimen as noted.

## 2020-03-22 NOTE — Progress Notes (Signed)
Subjective: 69 y.o. returns the office today for painful, elongated, thickened toenails which she cannot trim herself. Denies any redness or drainage around the nails. Surgical sites been doing well without any issues. Her diabetic shoes been fitting well. Denies any acute changes since last appointment and no new complaints today. Denies any systemic complaints such as fevers, chills, nausea, vomiting.   PCP: Adrian Prince, MD  Objective: AAO 3, NAD DP/PT pulses palpable, CRT less than 3 seconds Protective sensation decreased with Simms Weinstein monofilament, Achilles tendon reflex intact.  Nails hypertrophic, dystrophic, elongated, brittle, discolored 9. There is tenderness overlying the nails 1-5 bilaterally except for the left fifth toe which is been amputated. There is no surrounding erythema or drainage along the nail sites. Incision of the prior surgery is well-healed without any issues. Hyperkeratotic lesion on the plantar aspect of the lateral left foot. No underlying ulceration drainage or any signs of infection. No open lesions or pre-ulcerative lesions are identified. No pain with calf compression, swelling, warmth, erythema.  Assessment: Patient presents with symptomatic onychomycosis, preulcerative callus  Plan: -Treatment options including alternatives, risks, complications were discussed -Nails sharply debrided 9 without complication/bleeding. -Hyperkeratotic lesion sharply debrided x1 without any complications or bleeding. -Continue diabetic shoes. -Discussed daily foot inspection. If there are any changes, to call the office immediately.  -Follow-up in 3 months or sooner if any problems are to arise. In the meantime, encouraged to call the office with any questions, concerns, changes symptoms.  Ovid Curd, DPM

## 2020-04-04 ENCOUNTER — Ambulatory Visit: Payer: BC Managed Care – PPO | Admitting: Physician Assistant

## 2020-04-04 ENCOUNTER — Ambulatory Visit (INDEPENDENT_AMBULATORY_CARE_PROVIDER_SITE_OTHER)
Admission: RE | Admit: 2020-04-04 | Discharge: 2020-04-04 | Disposition: A | Discharge status: Home / Self Care | Payer: BC Managed Care – PPO | Source: Ambulatory Visit | Attending: Physician Assistant | Admitting: Physician Assistant

## 2020-04-04 ENCOUNTER — Other Ambulatory Visit: Payer: Self-pay

## 2020-04-04 ENCOUNTER — Ambulatory Visit (HOSPITAL_COMMUNITY)
Admission: RE | Admit: 2020-04-04 | Discharge: 2020-04-04 | Disposition: A | Discharge status: Home / Self Care | Payer: BC Managed Care – PPO | Source: Ambulatory Visit | Attending: Physician Assistant | Admitting: Physician Assistant

## 2020-04-04 VITALS — BP 154/67 | HR 68 | Temp 97.7°F | Resp 14 | Ht 67.0 in | Wt 226.0 lb

## 2020-04-04 DIAGNOSIS — I739 Peripheral vascular disease, unspecified: Secondary | ICD-10-CM

## 2020-04-04 DIAGNOSIS — L03032 Cellulitis of left toe: Secondary | ICD-10-CM | POA: Insufficient documentation

## 2020-04-04 DIAGNOSIS — L02612 Cutaneous abscess of left foot: Secondary | ICD-10-CM

## 2020-04-04 NOTE — Progress Notes (Signed)
Office Note     CC:  follow up Requesting Provider:  [name], MD  HPI: Tammy Good is a 70 y.o. (07-Apr-1950) female who presents for follow up of peripheral artery disease. She is s/p proximal left SFA atherectomy and stenting by [name] on 05/26/19. Prior to her intervention she had undergone a left 5th ray amputation by her Podiatrist Dr. Earleen Newport. At the time of her last visit with PA-C Arlee Muslim in October of 2021 she had fully healed from her amputation. She was not having any claudication symptoms, rest pain or other non healing wounds. She did have some elevated velocities in her SFA on duplex. Based on this she was given short term interval follow up.  She presents today for her follow up and non invasive studies. She denies any claudication symptoms, rest pain or tissue loss. Her left 5th ray amputation site remains well healed. She has been compliant with her Aspirin, statin and Plavix.  The pt is on a statin for cholesterol management.  The pt is on a daily aspirin.   Other AC:  Plavix The pt is on ACE, BB for hypertension.   The pt is diabetic.   Tobacco hx:  Former smoker  Past Medical History:  Diagnosis Date  . CAD S/P percutaneous coronary angioplasty 08/2013   100% mRCA - PCI Integrity Resolute DES 3.0 mm x 38 mm - 3.35 mm; PTCA of RPA V 2.0 mm x 15 mm  . Cholesteatoma    right  . Diabetes mellitus type 2 in obese (HCC)    On insulin and Invokana  . History of osteomyelitis L 5th Toe all 05/2019   s/p Partial Ray Amputation with partial closure; 6 wks Abx & LSFA Atherectomy/DEB PTA with Stent for focal dissection.  . Hyperlipidemia with target LDL less than 70   . Hypothyroidism (acquired)   . Mild essential hypertension   . Obesity (BMI 30-39.9) 11/17/2013  . PAD (peripheral artery disease) ([place]) 05/26/2019   05/26/19: Abd AoGram- BLE runoff -> L SFA orbital atherectomy - PTA w/ DEB & Stent 6 x 40 Luttonix (for focal dissection) - patent Pop A with 3 V  runoff. LEA Dopplers 01/03/2020: RABI (prev) 0.68 (0.53)/ RTBI (prev) 0.58 (0.33); LABI (prev) 0.80 (0.64), LTBI (prev) 0.64 (0.51); R mSFA ~50-74%, L mSFA 50-74%. Patent Prox SFA stent < 49% stenosis  . Panhypopituitarism ([place])   . ST elevation myocardial infarction (STEMI) of inferior wall, subsequent episode of care ([place]) 08/2013   80% branch of D1, 40% mid AV groove circumflex, 100% RCA with subacute thrombus -- thrombus extending into RPA V with 100% occlusion after initial angioplasty of mid RCA ;; Post MI ECHO 6/9/'15: EF 50-55%, mild LVH with moderate HK of inferior wall, Gr1 DD, mild LA dilation; mildly reduced RV function    Past Surgical History:  Procedure Laterality Date  . ABDOMINAL AORTOGRAM W/LOWER EXTREMITY N/A 05/26/2019   Procedure: ABDOMINAL AORTOGRAM W/LOWER EXTREMITY;  Surgeon: Marty Heck, MD;  Location: [facility];  Service: Cardiovascular;  Laterality: N/A;  . AMPUTATION Left 05/25/2019   Procedure: AMPUTATION RAY 5th;  Surgeon: Trula Slade, DPM;  Location: [facility];  Service: Podiatry;  Laterality: Left;  . Cardiac Event Monitor  July-August 2015   Sinus rhythm with PVCs  . COLONOSCOPY N/A 08/31/2013   Procedure: COLONOSCOPY;  Surgeon: Juanita Craver, MD;  Location[facility] ENDOSCOPY;  Service: Endoscopy;  Laterality: N/A;  . ESOPHAGOGASTRODUODENOSCOPY N/A 09/01/2013   Procedure: ESOPHAGOGASTRODUODENOSCOPY (  EGD);  Surgeon: Beryle Beams, MD;  Location: [facility];  Service: Endoscopy;  Laterality: N/A;  bedside  . LEFT HEART CATHETERIZATION WITH CORONARY ANGIOGRAM N/A 08/30/2013   Procedure: LEFT HEART CATHETERIZATION WITH CORONARY ANGIOGRAM;  Surgeon: Leonie Man, MD;  Location: [facility] CATH LAB: 100% mRCA (thrombus - extends to RPAV), 80% D1, 40% AVG Cx.  [name] PERCUTANEOUS CORONARY STENT INTERVENTION (PCI-S)  08/30/2013   Procedure: PERCUTANEOUS CORONARY STENT INTERVENTION (PCI-S);  Surgeon: Leonie Man, MD;  Location: [facility] CATH LAB;  Integrity Resolute DES 2.0 mm  x 38 mm -- 3.35 mm.; PTCA of proximal RPA V. - 2.0 mm x 15 mm balloon  . PERIPHERAL VASCULAR ATHERECTOMY  05/26/2019   Procedure: PERIPHERAL VASCULAR ATHERECTOMY;  Surgeon: Marty Heck, MD;  Location: [facility];  Service: Cardiovascular;;  Left SFA  . PERIPHERAL VASCULAR INTERVENTION  05/26/2019   Procedure: PERIPHERAL VASCULAR INTERVENTION;  Surgeon: Marty Heck, MD;  Location: [facility];  Service: Cardiovascular;;  Left SFA  . PITUITARY SURGERY    . TRANSTHORACIC ECHOCARDIOGRAM  08/30/2013   mild LVH. EF 50-55%. Moderate HK of the entire inferior myocardium. GR 1 DD. Mild LA dilation. Mildly reduced RV function  . TYMPANOMASTOIDECTOMY Right 12/28/2017   Procedure: RIGHT TYMPANOMASTOIDECTOMY;  Surgeon: Leta Baptist, MD;  Location: [facility];  Service: ENT;  Laterality: Right;    Social History   Socioeconomic History  . Marital status: Widowed    Spouse name: Not on file  . Number of children: Not on file  . Years of education: Not on file  . Highest education level: Not on file  Occupational History  . Not on file  Tobacco Use  . Smoking status: Former Research scientist (life sciences)  . Smokeless tobacco: Never Used  Substance and Sexual Activity  . Alcohol use: No  . Drug use: No  . Sexual activity: Not Currently    Birth control/protection: Post-menopausal  Other Topics Concern  . Not on file  Social History Narrative   Widow. Works at [employer].   Former smoker.   Overall not very active.  Hoping to get into water aerobics class.   Social Determinants of Health   Financial Resource Strain: Not on file  Food Insecurity: Not on file  Transportation Needs: Not on file  Physical Activity: Not on file  Stress: Not on file  Social Connections: Not on file  Intimate Partner Violence: Not on file    Family History  Problem Relation Age of Onset  . Cancer Mother 28       multiple myeloma  . Heart attack Father 28  . Cancer Sister   .  Alzheimer's disease Maternal Grandmother     Current Outpatient Medications  Medication Sig Dispense Refill  . aspirin EC 81 MG tablet Take 81 mg by mouth daily.    [name] atorvastatin (LIPITOR) 40 MG tablet Take 1 tablet (40 mg total) by mouth daily at 6 PM. 30 tablet 0  . clopidogrel (PLAVIX) 75 MG tablet Take 1 tablet (75 mg total) by mouth daily with breakfast. 90 tablet 3  . dexamethasone (DECADRON) 0.5 MG tablet Take 0.5 mg by mouth daily.    [name] FARXIGA 10 MG TABS tablet Take 10 mg by mouth daily.     . ferrous sulfate 325 (65 FE) MG tablet Take 325 mg by mouth daily with breakfast.    . Insulin Glargine (BASAGLAR KWIKPEN) 100 UNIT/ML SOPN Inject 48 Units into  the skin at bedtime. inject 52 units into skin at bedtime    . levothyroxine (SYNTHROID, LEVOTHROID) 175 MCG tablet Take 175 mcg by mouth daily before breakfast.    . lisinopril (ZESTRIL) 10 MG tablet Take 1 tablet (10 mg total) by mouth daily. 90 tablet 3  . metoprolol tartrate (LOPRESSOR) 25 MG tablet TAKE 1 TABLET TWICE A DAY 180 tablet 2  . NOVOLOG FLEXPEN 100 UNIT/ML FlexPen Inject 5-14 Units into the skin 3 (three) times daily with meals. Per sliding scale    . nystatin cream (MYCOSTATIN) Apply 1 application topically 2 (two) times daily. 30 g 1  . Omega-3 Fatty Acids (FISH OIL) 1000 MG CAPS Take 1,000 mg by mouth daily.    Glory Rosebush VERIO test strip USE 1 STRIP TO CHECK GLUCOSE THREE TIMES DAILY  1  . pantoprazole (PROTONIX) 40 MG tablet Take 1 tablet (40 mg total) by mouth 2 (two) times daily before a meal. 60 tablet 0   No current facility-administered medications for this visit.    Allergies  Allergen Reactions  . Strawberry Extract Itching, Swelling and Anaphylaxis    Mouth swells and gets itchy     REVIEW OF SYSTEMS:  '[X]'  denotes positive finding, '[ ]'  denotes negative finding Cardiac  Comments:  Chest pain or chest pressure:    Shortness of breath upon exertion:    Short of breath when lying flat:    Irregular  heart rhythm:        Vascular    Pain in calf, thigh, or hip brought on by ambulation:    Pain in feet at night that wakes you up from your sleep:     Blood clot in your veins:    Leg swelling:         Pulmonary    Oxygen at home:    Productive cough:     Wheezing:         Neurologic    Sudden weakness in arms or legs:     Sudden numbness in arms or legs:     Sudden onset of difficulty speaking or slurred speech:    Temporary loss of vision in one eye:     Problems with dizziness:         Gastrointestinal    Blood in stool:     Vomited blood:         Genitourinary    Burning when urinating:     Blood in urine:        Psychiatric    Major depression:         Hematologic    Bleeding problems:    Problems with blood clotting too easily:        Skin    Rashes or ulcers:        Constitutional    Fever or chills:      PHYSICAL EXAMINATION:  Vitals:   04/04/20 1118  BP: (!) 154/67  Pulse: 68  Resp: 14  Temp: 97.7 F (36.5 C)  TempSrc: Temporal  SpO2: 99%  Weight: 226 lb (102.5 kg)  Height: '5\' 7"'  (1.702 m)    General:  WDWN in NAD; vital signs documented above Gait: Normal HENT: WNL, normocephalic Pulmonary: normal non-labored breathing , without wheezing Cardiac: regular HR, without  Murmurs without carotid bruit Abdomen: obese, soft, NT, no masses Vascular Exam/Pulses:  Right Left  Radial 2+ (normal) 2+ (normal)  Femoral 2+ (normal) 2+ (normal)  Popliteal Not palpable Not palpable  DP 1+ (  weak) 2+ (normal)  PT Not palpable Not palpable   Extremities: without ischemic changes, without Gangrene , without cellulitis; without open wounds;left 5th ray amputation site well healed  Musculoskeletal: no muscle wasting or atrophy  Neurologic: A&O X 3;  No focal weakness or paresthesias are detected Psychiatric:  The pt has Normal affect.   Non-Invasive Vascular Imaging:  04/04/20 +-------+-----------+-----------+------------+------------+   ABI/TBIToday's ABIToday's TBIPrevious ABIPrevious TBI  +-------+-----------+-----------+------------+------------+  Right 0.60    0.54    0.68    0.58      +-------+-----------+-----------+------------+------------+  Left  0.89    0.57    0.80    0.68      +-------+-----------+-----------+------------+------------+   Arterial duplex of left lower extremity demonstrates 50-74% stenosis (349 PSV cm/s) noted in the mid superficial femoral artery. Minimal change from last study (329 PSV cm/s). Patent proximal SFA stent with 1-49% stenosis in the proximal stent. No significant change compared to previous study.   ASSESSMENT/PLAN:: 70 y.o. female here for follow up for peripheral artery disease.  She is s/p proximal left SFA atherectomy and stenting by [name] on 05/26/19. She is without any symptoms of claudication, rest pain or tissue loss.  -Her ABI/TBIs are stable today and essentially unchanged from prior study in October. Her left lower extremity arterial duplex again shows some stenosis in the mid SFA with very minimal change in velocities from prior study. She also has very minimal stenosis in the proximal SFA stent. - She will continue her Aspirin, statin and plavix - Discussed following up earlier should she develop any new or concerning symptoms and she understands to call - Will continue close interval follow up. I will have her return in 3 months with repeat ABI and arterial duplex of LLE   [name], PA-C [facility] [phone]  Clinic MD:  Dr. Scot Dock

## 2020-06-19 ENCOUNTER — Encounter: Payer: Self-pay | Admitting: Podiatry

## 2020-06-19 ENCOUNTER — Other Ambulatory Visit: Payer: Self-pay

## 2020-06-19 ENCOUNTER — Ambulatory Visit (INDEPENDENT_AMBULATORY_CARE_PROVIDER_SITE_OTHER): Payer: Medicare Other | Admitting: Podiatry

## 2020-06-19 DIAGNOSIS — E119 Type 2 diabetes mellitus without complications: Secondary | ICD-10-CM

## 2020-06-19 DIAGNOSIS — B351 Tinea unguium: Secondary | ICD-10-CM | POA: Diagnosis not present

## 2020-06-19 DIAGNOSIS — K760 Fatty (change of) liver, not elsewhere classified: Secondary | ICD-10-CM | POA: Insufficient documentation

## 2020-06-19 DIAGNOSIS — Q828 Other specified congenital malformations of skin: Secondary | ICD-10-CM | POA: Diagnosis not present

## 2020-06-19 DIAGNOSIS — M79674 Pain in right toe(s): Secondary | ICD-10-CM | POA: Diagnosis not present

## 2020-06-19 DIAGNOSIS — M79675 Pain in left toe(s): Secondary | ICD-10-CM

## 2020-06-19 DIAGNOSIS — S98139A Complete traumatic amputation of one unspecified lesser toe, initial encounter: Secondary | ICD-10-CM | POA: Insufficient documentation

## 2020-06-19 NOTE — Progress Notes (Signed)
Subjective: 70 y.o. returns the office today for painful, elongated, thickened toenails which she cannot trim herself. Denies any redness or drainage around the nails. Denies any acute changes since last appointment and no new complaints today. Denies any systemic complaints such as fevers, chills, nausea, vomiting.   PCP: [name], MD  Objective: AAO 3, NAD DP/PT pulses palpable, CRT less than 3 seconds Sensation decreased with Semmes Weinstein monofilament. Protective sensation decreased with Derrel Nip monofilament, Achilles tendon reflex intact.  Nails hypertrophic, dystrophic, elongated, brittle, discolored 9. There is tenderness overlying the nails 1-5 bilaterally except for the left fifth toe which is been amputated. There is no surrounding erythema or drainage along the nail sites. Incision is well-healed.  Hyperkeratotic tissue present on the plantar aspect of the amputation site without any underlying ulceration drainage or signs of infection. No open lesions or pre-ulcerative lesions are identified. No pain with calf compression, swelling, warmth, erythema.  Assessment: Patient presents with symptomatic onychomycosis, preulcerative callus  Plan: -Treatment options including alternatives, risks, complications were discussed -Nails sharply debrided 9 without complication/bleeding. -Hyperkeratotic lesion sharply debrided x1 without any complications or bleeding. -Continue diabetic shoes. -Discussed daily foot inspection. If there are any changes, to call the office immediately.  -Follow-up in 3 months or sooner if any problems are to arise. In the meantime, encouraged to call the office with any questions, concerns, changes symptoms.  Tammy Good, DPM

## 2020-07-02 ENCOUNTER — Other Ambulatory Visit: Payer: Self-pay

## 2020-07-02 ENCOUNTER — Ambulatory Visit (INDEPENDENT_AMBULATORY_CARE_PROVIDER_SITE_OTHER)
Admission: RE | Admit: 2020-07-02 | Discharge: 2020-07-02 | Disposition: A | Discharge status: Home / Self Care | Payer: Medicare Other | Source: Ambulatory Visit | Attending: Vascular Surgery | Admitting: Vascular Surgery

## 2020-07-02 ENCOUNTER — Ambulatory Visit (HOSPITAL_COMMUNITY)
Admission: RE | Admit: 2020-07-02 | Discharge: 2020-07-02 | Disposition: A | Discharge status: Home / Self Care | Payer: Medicare Other | Source: Ambulatory Visit | Attending: Vascular Surgery | Admitting: Vascular Surgery

## 2020-07-02 ENCOUNTER — Ambulatory Visit (INDEPENDENT_AMBULATORY_CARE_PROVIDER_SITE_OTHER): Payer: Medicare Other | Admitting: Physician Assistant

## 2020-07-02 VITALS — BP 162/63 | HR 78 | Temp 98.3°F | Resp 20 | Ht 67.0 in | Wt 236.2 lb

## 2020-07-02 DIAGNOSIS — I739 Peripheral vascular disease, unspecified: Secondary | ICD-10-CM

## 2020-07-02 NOTE — Progress Notes (Signed)
HISTORY AND PHYSICAL     CC:  follow up. Requesting Provider:  [name], MD  HPI: This is a 70 y.o. female who is here today for follow up for PAD.  She is s/p proximal left SFA atherectomy and stenting by [name] on 05/26/19. Prior to her intervention she had undergone a left 5th ray amputation by her Podiatrist Dr. Earleen Newport. At the time of her last visit with PA-C Arlee Muslim in October of 2021 she had fully healed from her amputation.   Pt was last seen April 04, 2020 and at that time, she was doing well without claudication, rest pain or non healing wounds.  Her amputation site continued to be well healed and she was compliant with her asa/statin.    The pt returns today for follow up.  She states that she is doing well.  She does not have any claudication sx or rest pain or non healing wounds.  She states she is mainly limited by her lower back on the right.    She has been celebrating her birthday with different family members/friends!  The pt is on a statin for cholesterol management.    The pt is on an aspirin.    Other AC:  Plavix The pt is on ACEI, BB for hypertension.  The pt does have diabetes. Tobacco hx:  former  Pt does not have family hx of AAA.  Past Medical History:  Diagnosis Date  . CAD S/P percutaneous coronary angioplasty 08/2013   100% mRCA - PCI Integrity Resolute DES 3.0 mm x 38 mm - 3.35 mm; PTCA of RPA V 2.0 mm x 15 mm  . Cholesteatoma    right  . Diabetes mellitus type 2 in obese (HCC)    On insulin and Invokana  . History of osteomyelitis L 5th Toe all 05/2019   s/p Partial Ray Amputation with partial closure; 6 wks Abx & LSFA Atherectomy/DEB PTA with Stent for focal dissection.  . Hyperlipidemia with target LDL less than 70   . Hypothyroidism (acquired)   . Mild essential hypertension   . Obesity (BMI 30-39.9) 11/17/2013  . PAD (peripheral artery disease) ([place]) 05/26/2019   05/26/19: Abd AoGram- BLE runoff -> L SFA orbital atherectomy - PTA w/  DEB & Stent 6 x 40 Luttonix (for focal dissection) - patent Pop A with 3 V runoff. LEA Dopplers 01/03/2020: RABI (prev) 0.68 (0.53)/ RTBI (prev) 0.58 (0.33); LABI (prev) 0.80 (0.64), LTBI (prev) 0.64 (0.51); R mSFA ~50-74%, L mSFA 50-74%. Patent Prox SFA stent < 49% stenosis  . Panhypopituitarism ([place])   . ST elevation myocardial infarction (STEMI) of inferior wall, subsequent episode of care ([place]) 08/2013   80% branch of D1, 40% mid AV groove circumflex, 100% RCA with subacute thrombus -- thrombus extending into RPA V with 100% occlusion after initial angioplasty of mid RCA ;; Post MI ECHO 6/9/'15: EF 50-55%, mild LVH with moderate HK of inferior wall, Gr1 DD, mild LA dilation; mildly reduced RV function    Past Surgical History:  Procedure Laterality Date  . ABDOMINAL AORTOGRAM W/LOWER EXTREMITY N/A 05/26/2019   Procedure: ABDOMINAL AORTOGRAM W/LOWER EXTREMITY;  Surgeon: Marty Heck, MD;  Location: [place] [facility];  Service: Cardiovascular;  Laterality: N/A;  . AMPUTATION Left 05/25/2019   Procedure: AMPUTATION RAY 5th;  Surgeon: Trula Slade, DPM;  Location: [place];  Service: Podiatry;  Laterality: Left;  . Cardiac Event Monitor  July-August 2015   Sinus rhythm with PVCs  .  COLONOSCOPY N/A 08/31/2013   Procedure: COLONOSCOPY;  Surgeon: Juanita Craver, MD;  Location: [facility] ENDOSCOPY;  Service: Endoscopy;  Laterality: N/A;  . ESOPHAGOGASTRODUODENOSCOPY N/A 09/01/2013   Procedure: ESOPHAGOGASTRODUODENOSCOPY (EGD);  Surgeon: Beryle Beams, MD;  Location: [facility] ENDOSCOPY;  Service: Endoscopy;  Laterality: N/A;  bedside  . LEFT HEART CATHETERIZATION WITH CORONARY ANGIOGRAM N/A 08/30/2013   Procedure: LEFT HEART CATHETERIZATION WITH CORONARY ANGIOGRAM;  Surgeon: Leonie Man, MD;  Location: [facility] CATH LAB: 100% mRCA (thrombus - extends to RPAV), 80% D1, 40% AVG Cx.  [name] PERCUTANEOUS CORONARY STENT INTERVENTION (PCI-S)  08/30/2013   Procedure: PERCUTANEOUS CORONARY STENT INTERVENTION (PCI-S);  Surgeon:  Leonie Man, MD;  Location: [facility] CATH LAB;  Integrity Resolute DES 2.0 mm x 38 mm -- 3.35 mm.; PTCA of proximal RPA V. - 2.0 mm x 15 mm balloon  . PERIPHERAL VASCULAR ATHERECTOMY  05/26/2019   Procedure: PERIPHERAL VASCULAR ATHERECTOMY;  Surgeon: Marty Heck, MD;  Location: [place] [facility];  Service: Cardiovascular;;  Left SFA  . PERIPHERAL VASCULAR INTERVENTION  05/26/2019   Procedure: PERIPHERAL VASCULAR INTERVENTION;  Surgeon: Marty Heck, MD;  Location: [place] [facility];  Service: Cardiovascular;;  Left SFA  . PITUITARY SURGERY    . TRANSTHORACIC ECHOCARDIOGRAM  08/30/2013   mild LVH. EF 50-55%. Moderate HK of the entire inferior myocardium. GR 1 DD. Mild LA dilation. Mildly reduced RV function  . TYMPANOMASTOIDECTOMY Right 12/28/2017   Procedure: RIGHT TYMPANOMASTOIDECTOMY;  Surgeon: Leta Baptist, MD;  Location: [place];  Service: ENT;  Laterality: Right;    Allergies  Allergen Reactions  . Strawberry Extract Itching, Swelling and Anaphylaxis    Mouth swells and gets itchy    Current Outpatient Medications  Medication Sig Dispense Refill  . aspirin EC 81 MG tablet Take 81 mg by mouth daily.    [name] atorvastatin (LIPITOR) 40 MG tablet Take 1 tablet (40 mg total) by mouth daily at 6 PM. 30 tablet 0  . B-D UF III MINI PEN NEEDLES 31G X 5 MM MISC Inject into the skin.    [name] clopidogrel (PLAVIX) 75 MG tablet Take 1 tablet (75 mg total) by mouth daily with breakfast. 90 tablet 3  . Continuous Blood Gluc Sensor (FREESTYLE LIBRE 2 SENSOR) MISC Apply topically every 14 (fourteen) days.    [name] dexamethasone (DECADRON) 0.5 MG tablet Take 0.5 mg by mouth daily.    [name] FARXIGA 10 MG TABS tablet Take 10 mg by mouth daily.     . ferrous sulfate 325 (65 FE) MG tablet Take 325 mg by mouth daily with breakfast.    . Insulin Glargine (BASAGLAR KWIKPEN) 100 UNIT/ML SOPN Inject 48 Units into the skin at bedtime. inject 52 units into skin at bedtime    . iron polysaccharides  (NU-IRON) 150 MG capsule Take 1 capsule by mouth daily.    [name] levothyroxine (SYNTHROID, LEVOTHROID) 175 MCG tablet Take 175 mcg by mouth daily before breakfast.    . lisinopril (ZESTRIL) 10 MG tablet Take 1 tablet (10 mg total) by mouth daily. 90 tablet 3  . metoprolol tartrate (LOPRESSOR) 25 MG tablet TAKE 1 TABLET TWICE A DAY 180 tablet 2  . NOVOLOG FLEXPEN 100 UNIT/ML FlexPen Inject 5-14 Units into the skin 3 (three) times daily with meals. Per sliding scale    . nystatin cream (MYCOSTATIN) Apply 1 application topically 2 (two) times daily. 30 g 1  . Omega-3 Fatty Acids (FISH OIL) 1000 MG CAPS Take 1,000 mg by  mouth daily.    Glory Rosebush VERIO test strip USE 1 STRIP TO CHECK GLUCOSE THREE TIMES DAILY  1  . pantoprazole (PROTONIX) 40 MG tablet Take 1 tablet (40 mg total) by mouth 2 (two) times daily before a meal. 60 tablet 0   No current facility-administered medications for this visit.    Family History  Problem Relation Age of Onset  . Cancer Mother 39       multiple myeloma  . Heart attack Father 86  . Cancer Sister   . Alzheimer's disease Maternal Grandmother     Social History   Socioeconomic History  . Marital status: Widowed    Spouse name: Not on file  . Number of children: Not on file  . Years of education: Not on file  . Highest education level: Not on file  Occupational History  . Not on file  Tobacco Use  . Smoking status: Former Research scientist (life sciences)  . Smokeless tobacco: Never Used  Substance and Sexual Activity  . Alcohol use: No  . Drug use: No  . Sexual activity: Not Currently    Birth control/protection: Post-menopausal  Other Topics Concern  . Not on file  Social History Narrative   Widow. Works at [employer].   Former smoker.   Overall not very active.  Hoping to get into water aerobics class.   Social Determinants of Health   Financial Resource Strain: Not on file  Food Insecurity: Not on file  Transportation Needs: Not on file  Physical Activity: Not  on file  Stress: Not on file  Social Connections: Not on file  Intimate Partner Violence: Not on file     REVIEW OF SYSTEMS:   [X] denotes positive finding, [ ] denotes negative finding Cardiac  Comments:  Chest pain or chest pressure:    Shortness of breath upon exertion:    Short of breath when lying flat:    Irregular heart rhythm:        Vascular    Pain in calf, thigh, or hip brought on by ambulation:    Pain in feet at night that wakes you up from your sleep:     Blood clot in your veins:    Leg swelling:         Pulmonary    Oxygen at home:    Productive cough:     Wheezing:         Neurologic    Sudden weakness in arms or legs:     Sudden numbness in arms or legs:     Sudden onset of difficulty speaking or slurred speech:    Temporary loss of vision in one eye:     Problems with dizziness:         Gastrointestinal    Blood in stool:     Vomited blood:         Genitourinary    Burning when urinating:     Blood in urine:        Psychiatric    Major depression:         Hematologic    Bleeding problems:    Problems with blood clotting too easily:        Skin    Rashes or ulcers:        Constitutional    Fever or chills:      PHYSICAL EXAMINATION:  Today's Vitals   07/02/20 1026  BP: (!) 162/63  Pulse: 78  Resp: 20  Temp:  98.3 F (36.8 C)  TempSrc: Temporal  SpO2: 98%  Weight: 236 lb 3.2 oz (107.1 kg)  Height: 5' 7" (1.702 m)   Body mass index is 36.99 kg/m.   General:  WDWN in NAD; vital signs documented above Gait: Not observed HENT: WNL, normocephalic Pulmonary: normal non-labored breathing , without wheezing Cardiac: regular HR, without  Murmur; without carotid bruits Abdomen: soft, NT, no masses; aortic pulse is not palpable Skin: without rashes Vascular Exam/Pulses:  Right Left  Femoral Unable to palpate due to body habitus Unable to palpate due to body habitus  DP Brisk doppler Brisk doppler  PT Unable to obtain doppler  Faint doppler signal  Peroneal Brisk doppler Brisk doppler   Extremities: without ischemic changes, without Gangrene , without cellulitis; without open wounds; well healed toe amputation site. Musculoskeletal: no muscle wasting or atrophy  Neurologic: A&O X 3;  No focal weakness or paresthesias are detected Psychiatric:  The pt has Normal affect.   Non-Invasive Vascular Imaging:   ABI's/TBI's on 07/02/2020: Right:  0.52/0.50 - Great toe pressure: 91 Left:  0.80/0.64 - Great toe pressure: 117  LLE Arterial duplex on 07/02/2020: +-----------+--------+-----+---------------+--------+--------+  LEFT    PSV cm/sRatioStenosis    WaveformComments  +-----------+--------+-----+---------------+--------+--------+  CFA Distal 249      50-74% stenosisbiphasic      +-----------+--------+-----+---------------+--------+--------+  DFA    239      50-74% stenosisbiphasic      +-----------+--------+-----+---------------+--------+--------+  SFA Mid  187              biphasic      +-----------+--------+-----+---------------+--------+--------+  SFA Distal 148              biphasic      +-----------+--------+-----+---------------+--------+--------+  POP Prox  146              biphasic      +-----------+--------+-----+---------------+--------+--------+  POP Distal 107              biphasic      +-----------+--------+-----+---------------+--------+--------+  ATA Distal 74              biphasic      +-----------+--------+-----+---------------+--------+--------+  PTA Distal 42              biphasic      +-----------+--------+-----+---------------+--------+--------+  PERO Distal40              biphasic      +-----------+--------+-----+---------------+--------+--------+     Left Stent(s):   +---------------+---+--------------+++  Prox to Stent 188         +---------------+---+--------------+++  Proximal Stent 174         +---------------+---+--------------+++  Mid Stent   1831-49% stenosis  +---------------+---+--------------+++  Distal Stent  1911-49% stenosis  +---------------+---+--------------+++  Distal to [id number]         +---------------+---+--------------+++   Summary:  Left: 50-74% stenosis noted in the common femoral artery. 50-74% stenosis noted in the deep femoral artery. Left SFA stent 1-49% stenosis.   Previous ABI's/TBI's on 04/04/2020: Right:  0.63/0.54 - Great toe pressure: 91 Left:  0.89/0.57 - Great toe pressure:  97   Previous arterial duplex on 04/04/2020: +-----------+--------+-----+---------------+--------+--------+  LEFT    PSV cm/sRatioStenosis    WaveformComments  +-----------+--------+-----+---------------+--------+--------+  CFA Prox  223              biphasic      +-----------+--------+-----+---------------+--------+--------+  DFA    181              biphasic      +-----------+--------+-----+---------------+--------+--------+  SFA Prox  182              biphasic      +-----------+--------+-----+---------------+--------+--------+  SFA Mid  349      50-74% stenosisbiphasic      +-----------+--------+-----+---------------+--------+--------+  SFA Distal 152              biphasic      +-----------+--------+-----+---------------+--------+--------+  POP Prox  123              biphasic      +-----------+--------+-----+---------------+--------+--------+  ATA Distal 95              biphasic      +-----------+--------+-----+---------------+--------+--------+  PTA Distal 47              biphasic       +-----------+--------+-----+---------------+--------+--------+  PERO Distal70              biphasic      +-----------+--------+-----+---------------+--------+--------+     Left Stent(s):  +--------------------------+--------+--------------+--------+--------+  Superficial femoral arteryPSV cm/sStenosis   WaveformComments  +--------------------------+--------+--------------+--------+--------+  Prox to Stent       174          biphasic      +--------------------------+--------+--------------+--------+--------+  Proximal Stent      240   1-49% stenosisbiphasic      +--------------------------+--------+--------------+--------+--------+  Mid Stent         201          biphasic      +--------------------------+--------+--------------+--------+--------+  Distal Stent       140          biphasic      +--------------------------+--------+--------------+--------+--------+  Distal to Stent      169          biphasic      +--------------------------+--------+--------------+--------+--------+   Summary:  Right: 50-74% stenosis noted in the mid superficial femoral artery. Patent proximal SFA stent with 1-49% stenosis in the proximal stent. No significant change compared to previous study.    ASSESSMENT/PLAN:: 70 y.o. female here for follow up for PAD with hx of proximal left SFA atherectomy and stenting by [name] on 05/26/19. Prior to her intervention she had undergone a left 5th ray amputation by her Podiatrist Dr. Earleen Newport  -pt doing well with ABI and LLE arterial duplex essentially unchanged from 3 months ago.  She is asymptomatic at this time.  Velocities in the femoral artery lower than previous exam.  Given no sx, will have her return in 3 months for repeat LLE duplex and ABI to make sure stenosis is not progressing.  She is in agreement with this  plan and if she develops rest pain, non healing wounds, she will call us sooner.  -she is compliant with her asa/statin/plavix-continue   Leontine Locket, [facility] [phone]  Clinic MD:   Donzetta Matters

## 2020-07-04 ENCOUNTER — Other Ambulatory Visit: Payer: Self-pay

## 2020-07-04 DIAGNOSIS — I739 Peripheral vascular disease, unspecified: Secondary | ICD-10-CM

## 2020-07-27 ENCOUNTER — Other Ambulatory Visit: Payer: Self-pay | Admitting: Vascular Surgery

## 2020-07-27 DIAGNOSIS — I739 Peripheral vascular disease, unspecified: Secondary | ICD-10-CM

## 2020-09-10 ENCOUNTER — Ambulatory Visit (INDEPENDENT_AMBULATORY_CARE_PROVIDER_SITE_OTHER): Payer: Medicare Other | Admitting: Podiatry

## 2020-09-10 ENCOUNTER — Encounter: Payer: Self-pay | Admitting: Podiatry

## 2020-09-10 ENCOUNTER — Other Ambulatory Visit: Payer: Self-pay

## 2020-09-10 DIAGNOSIS — B351 Tinea unguium: Secondary | ICD-10-CM | POA: Diagnosis not present

## 2020-09-10 DIAGNOSIS — M79674 Pain in right toe(s): Secondary | ICD-10-CM | POA: Diagnosis not present

## 2020-09-10 DIAGNOSIS — Q828 Other specified congenital malformations of skin: Secondary | ICD-10-CM | POA: Diagnosis not present

## 2020-09-10 DIAGNOSIS — M79675 Pain in left toe(s): Secondary | ICD-10-CM | POA: Diagnosis not present

## 2020-09-10 DIAGNOSIS — E119 Type 2 diabetes mellitus without complications: Secondary | ICD-10-CM

## 2020-09-10 NOTE — Patient Instructions (Signed)

## 2020-09-12 NOTE — Progress Notes (Signed)
Subjective: 70 y.o. returns the office today for painful, elongated, thickened toenails which she cannot trim herself. Denies any redness or drainage around the nails.  Also has a callus on her left foot.  Denies any acute changes since last appointment and no new complaints today. Denies any systemic complaints such as fevers, chills, nausea, vomiting.   PCP: [name], MD  Objective: AAO 3, NAD DP/PT pulses palpable, CRT less than 3 seconds Sensation decreased with Semmes Weinstein monofilament. Protective sensation decreased with Derrel Nip monofilament Nails hypertrophic, dystrophic, elongated, brittle, discolored 9. There is tenderness overlying the nails 1-5 bilaterally except for the left fifth toe which is been amputated. There is no surrounding erythema or drainage along the nail sites. Incision is well-healed.  Hyperkeratotic tissue present on the plantar aspect of the amputation site without any underlying ulceration drainage or signs of infection. No open lesions or pre-ulcerative lesions are identified. No pain with calf compression, swelling, warmth, erythema.  Assessment: Patient presents with symptomatic onychomycosis, preulcerative callus  Plan: -Treatment options including alternatives, risks, complications were discussed -Nails sharply debrided 9 without complication/bleeding. -Hyperkeratotic lesion sharply debrided x1 without any complications or bleeding. -Continue diabetic shoes. -Discussed daily foot inspection. If there are any changes, to call the office immediately.  -Follow-up in 3 months or sooner if any problems are to arise. In the meantime, encouraged to call the office with any questions, concerns, changes symptoms.  Celesta Gentile, DPM

## 2020-09-14 ENCOUNTER — Encounter: Payer: Self-pay | Admitting: Cardiology

## 2020-09-14 ENCOUNTER — Other Ambulatory Visit: Payer: Self-pay

## 2020-09-14 ENCOUNTER — Ambulatory Visit (INDEPENDENT_AMBULATORY_CARE_PROVIDER_SITE_OTHER): Payer: Medicare Other | Admitting: Cardiology

## 2020-09-14 VITALS — BP 128/68 | HR 90 | Resp 18 | Ht 67.0 in | Wt 235.0 lb

## 2020-09-14 DIAGNOSIS — I2119 ST elevation (STEMI) myocardial infarction involving other coronary artery of inferior wall: Secondary | ICD-10-CM

## 2020-09-14 DIAGNOSIS — E669 Obesity, unspecified: Secondary | ICD-10-CM | POA: Diagnosis not present

## 2020-09-14 DIAGNOSIS — E785 Hyperlipidemia, unspecified: Secondary | ICD-10-CM

## 2020-09-14 DIAGNOSIS — I10 Essential (primary) hypertension: Secondary | ICD-10-CM

## 2020-09-14 DIAGNOSIS — I2510 Atherosclerotic heart disease of native coronary artery without angina pectoris: Secondary | ICD-10-CM

## 2020-09-14 DIAGNOSIS — I739 Peripheral vascular disease, unspecified: Secondary | ICD-10-CM

## 2020-09-14 DIAGNOSIS — I493 Ventricular premature depolarization: Secondary | ICD-10-CM

## 2020-09-14 DIAGNOSIS — Z9861 Coronary angioplasty status: Secondary | ICD-10-CM

## 2020-09-14 DIAGNOSIS — Z955 Presence of coronary angioplasty implant and graft: Secondary | ICD-10-CM

## 2020-09-14 DIAGNOSIS — E1169 Type 2 diabetes mellitus with other specified complication: Secondary | ICD-10-CM

## 2020-09-14 NOTE — Patient Instructions (Signed)
Medication Instructions:  NO CHANGES   *If you need a refill on your cardiac medications before your next appointment, please call your pharmacy*   Lab Work:  NOT NEEDED   Testing/Procedures:  NOT NEEDED  Follow-Up: At [facility], you and your health needs are our priority.  As part of our continuing mission to provide you with exceptional heart care, we have created designated Provider Care Teams.  These Care Teams include your primary Cardiologist (physician) and Advanced Practice Providers (APPs -  Physician Assistants and Nurse Practitioners) who all work together to provide you with the care you need, when you need it.     Your next appointment:   12 month(s)  The format for your next appointment:   In Person  Provider:   Glenetta Hew, MD   Other Instructions  Your physician recommends that you schedule a follow-up appointment in: 1 [place]

## 2020-09-14 NOTE — Progress Notes (Signed)
Primary Care Provider: [name], MD Cardiologist: Glenetta Hew, MD Electrophysiologist: None  Clinic Note: Chief Complaint  Patient presents with   Follow-up    Doing well from a cardiac standpoint.   Hypertension    Blood pressure elevated at home-more than here.   Coronary Artery Disease    No chest pain or pressure.   PAD    Claudication pretty well controlled.  Recently checked by vascular surgeon.    ===================================  ASSESSMENT/PLAN   Problem List Items Addressed This Visit     PAD (peripheral artery disease) ([place]) (Chronic)    Now followed again by Vascular Surgery.   She is on aspirin, Plavix -> Would Defer to Vascular Surgery for timing of whether or not she can stop Plavix.  I would imagine that she would be fine based to hold 5 days preop for surgical procedures because of being more than a year out from her procedure.  Also on the risk factor modification similar to CAD.  Statin, beta-blocker and ACE inhibitor.       Hyperlipidemia associated with type 2 diabetes mellitus (HCC) (Chronic)    Well-controlled LDL of 52 last August.  Should be due for labs recheck soon by Dr. Forde Dandy who is managing both diabetes and lipids..  Plan: Continue current dose of atorvastatin. She is on Farxiga and insulin with a Freestyle Elenor Legato' monitor.         Essential hypertension (Chronic)    When I last saw her, her BP was a little elevated.  Today it seems normal, but she says that at home is higher.  Low threshold to increase lisinopril back to 20 mg.  I have asked her to monitor pressures at home and bring her blood pressure cuff in with her for CVRR (Hypertension Clinic) BP check.  If indicated, would titrate lisinopril back to 20 mg.       Obesity (BMI 30-39.9) (Chronic)   PVC's (premature ventricular contractions) (Chronic)    Asymptomatic on current dose of beta-blocker.  She does having to titrate up further if necessary for additional BP  control       ST elevation myocardial infarction (STEMI) of inferior wall, subsequent episode of care [facility]) (Chronic)    She is now 7 years out from her MI with PCI to the RCA.  Post MI follow-up echocardiogram showed relatively normal/low normal EF with moderate inferior hypokinesis consistent with MI.  Has not had any further anginal symptoms, or any CHF symptoms..       CAD S/P percutaneous coronary angioplasty (Chronic)    She is 7 years out from RCA PCI.  Over 1 year out from her peripheral vascular intervention.  From a cardiac standpoint, okay to hold aspirin and/or Plavix 5 to 7 days preop for surgical procedures.  At this point would defer to vascular surgery, but likely okay to hold.       Atherosclerotic heart disease of native coronary artery without angina pectoris - Primary (Chronic)    Doing well.  No active angina.  On stable regimen.  Plan: Lipids well controlled as of last year on current dose of statin-she is due for lab follow-up with Dr. Forde Dandy this fall. On metoprolol 25 mg twice daily -> she does have plenty of room to titrate this further based on resting heart rate of 90 bpm. On ACE inhibitor-low threshold to increase to 20 mg. On Farxiga On DAPT following peripheral vascular procedure.  Could probably stop aspirin, but will defer to  vascular surgery. Again, defer to vascular surgery for questions about potentially stopping Plavix for surgeries or procedures.       ===================================  HPI:    Tammy Good is a 70 y.o. female with a PMH notable for CAD-inferior STEMI in 2015, PAD, DM-2 (on insulin), HTN/HLD and panhypopituitarism who presents today for 57-month follow-up with.  CAD: Inferior STEMI June 2015 (PCI to RCA with PTCA of PDA -> Integrity Resolute 3.0 mmx 38 mm -> 3.35 mm) - PTCA of PAV/PLA EF ~55%, inferior HK with Gr 1 DD PAD: 06/11/2019-L ABI 0.64-TBI 0.35 (s/p L foot 5th ray-partial amputation secondary to  osteomyelitis) Abd Ao-gram w/ LEA runoff - 05/26/2019: L SFA orbital atherectomy- DEB PTA & Stent for focal dissection (Luttonix 6.0 x 40 mm) - Patent PopA w/ 3 V Runoff.  LEA Dopplers 01/03/2020: RABI (prev) 0.68 (0.53)/ RTBI (prev) 0.58 (0.33); LABI (prev) 0.80 (0.64), LTBI (prev) 0.64 (0.51); R mSFA ~50-74%, L mSFA 50-74%. Patent Prox SFA stent < 49% stenosis   Tammy Good was last seen on 03/01/2020 as a 42-month follow-up.  Recovering relatively well.  Having some balance issues after her amputation, but otherwise seem to be healing pretty well.  Energy level getting better.  In good spirits.  No further claudication and only mild exertional dyspnea without chest pain.  Trivial edema.  Recent Hospitalizations: None  Reviewed  CV studies:    The following studies were reviewed today: (if available, images/films reviewed: From Epic Chart or Care Everywhere) 07/03/2020 - ABIs checked by Vascular Surgery: R ABI 0.52 (previous 0.6), TBI 0.5 (previous 0.53), L ABI 0.8 (0.89), TBI 0.64 (0.57) LLE Arterial Doppler - 50-74% L CFA & DFA.  LSFA 1-49%  Interval History:   Tammy Good returns here today overall feeling pretty well.  She says her claudication is definitely better.  She is really more limited by osteoarthritis related back pain and hip pain.  Makes it very difficult for her to do a lot of walking.  As such, the claudication really is is hard to note. She feels that a lot of the shortness of breath she has been walking is probably related to discomfort from arthritis as opposed to true dyspnea.  Maybe a little bit of exercise intolerance as a result, but no chest pain or pressure with rest or exertion.  No significant exertional dyspnea.  No PND, orthopnea with trivial edema.  No palpitations.  No syncope or near syncope, TIA/amaurosis fugax.  She says at home her blood pressures actually have been running a little bit high sometimes as high as systolic pressures in the 150s to  170s.  Today's pressure is actually relatively low for which she has been reading at home.  She denies any associated headaches or blurred vision.  No real dizziness or wooziness.  REVIEWED OF SYSTEMS   Review of Systems  Constitutional:  Negative for malaise/fatigue (Limited exercise intolerance because of arthritis pains) and weight loss (She is actually gained some weight because of lack of mobility).  HENT:  Negative for congestion and nosebleeds.   Respiratory:  Negative for shortness of breath (Not really short of breath, more just some exercise fatigue from arthritis pains.).   Cardiovascular:  Positive for leg swelling (Trivial).  Gastrointestinal:  Negative for blood in stool, diarrhea and melena.  Genitourinary:  Negative for hematuria.  Musculoskeletal:  Positive for back pain and joint pain (Right> left hip). Negative for myalgias.  Neurological:  Positive for tingling (Some radiculopathy down  the legs from back pain) and weakness (Legs are little weak). Negative for dizziness (Only if she stands up too quickly) and loss of consciousness.  Psychiatric/Behavioral:  Negative for depression and memory loss. The patient is not nervous/anxious and does not have insomnia.    I have reviewed and (if needed) personally updated the patient's problem list, medications, allergies, past medical and surgical history, social and family history.   PAST MEDICAL HISTORY   Past Medical History:  Diagnosis Date   CAD S/P percutaneous coronary angioplasty 08/2013   100% mRCA - PCI Integrity Resolute DES 3.0 mm x 38 mm - 3.35 mm; PTCA of RPA V 2.0 mm x 15 mm   Cholesteatoma    right   Diabetes mellitus type 2 in obese (HCC)    On insulin and Invokana   History of osteomyelitis L 5th Toe all 05/2019   s/p Partial Ray Amputation with partial closure; 6 wks Abx & LSFA Atherectomy/DEB PTA with Stent for focal dissection.   Hyperlipidemia with target LDL less than 70    Hypothyroidism (acquired)     Mild essential hypertension    Obesity (BMI 30-39.9) 11/17/2013   PAD (peripheral artery disease) ([place]) 05/26/2019   05/26/19: Abd AoGram- BLE runoff -> L SFA orbital atherectomy - PTA w/ DEB & Stent 6 x 40 Luttonix (for focal dissection) - patent Pop A with 3 V runoff. LEA Dopplers 01/03/2020: RABI (prev) 0.68 (0.53)/ RTBI (prev) 0.58 (0.33); LABI (prev) 0.80 (0.64), LTBI (prev) 0.64 (0.51); R mSFA ~50-74%, L mSFA 50-74%. Patent Prox SFA stent < 49% stenosis   Panhypopituitarism (HCC)    ST elevation myocardial infarction (STEMI) of inferior wall, subsequent episode of care ([place]) 08/2013   80% branch of D1, 40% mid AV groove circumflex, 100% RCA with subacute thrombus -- thrombus extending into RPA V with 100% occlusion after initial angioplasty of mid RCA ;; Post MI ECHO 6/9/'15: EF 50-55%, mild LVH with moderate HK of inferior wall, Gr1 DD, mild LA dilation; mildly reduced RV function    PAST SURGICAL HISTORY   Past Surgical History:  Procedure Laterality Date   ABDOMINAL AORTOGRAM W/LOWER EXTREMITY N/A 05/26/2019   Procedure: ABDOMINAL AORTOGRAM W/LOWER EXTREMITY;  Surgeon: Marty Heck, MD;  Location: [place] [facility];  Service: Cardiovascular;  Laterality: N/A;   AMPUTATION Left 05/25/2019   Procedure: AMPUTATION RAY 5th;  Surgeon: Trula Slade, DPM;  Location: [place];  Service: Podiatry;  Laterality: Left;   Cardiac Event Monitor  July-August 2015   Sinus rhythm with PVCs   COLONOSCOPY N/A 08/31/2013   Procedure: COLONOSCOPY;  Surgeon: Juanita Craver, MD;  Location: [facility] ENDOSCOPY;  Service: Endoscopy;  Laterality: N/A;   ESOPHAGOGASTRODUODENOSCOPY N/A 09/01/2013   Procedure: ESOPHAGOGASTRODUODENOSCOPY (EGD);  Surgeon: Beryle Beams, MD;  Location: [facility] ENDOSCOPY;  Service: Endoscopy;  Laterality: N/A;  bedside   LEFT HEART CATHETERIZATION WITH CORONARY ANGIOGRAM N/A 08/30/2013   Procedure: LEFT HEART CATHETERIZATION WITH CORONARY ANGIOGRAM;  Surgeon: Leonie Man, MD;  Location:  [facility] CATH LAB: 100% mRCA (thrombus - extends to RPAV), 80% D1, 40% AVG Cx.   PERCUTANEOUS CORONARY STENT INTERVENTION (PCI-S)  08/30/2013   Procedure: PERCUTANEOUS CORONARY STENT INTERVENTION (PCI-S);  Surgeon: Leonie Man, MD;  Location: [facility] CATH LAB;  Integrity Resolute DES 2.0 mm x 38 mm -- 3.35 mm.; PTCA of proximal RPA V. - 3.0 mm x 15 mm balloon   PERIPHERAL VASCULAR ATHERECTOMY  05/26/2019   Procedure: PERIPHERAL VASCULAR ATHERECTOMY;  Surgeon: Carlis Abbott,  Gwenyth Allegra, MD;  Location: [place] CV [facility];  Service: Cardiovascular;;  Left SFA   PERIPHERAL VASCULAR INTERVENTION  05/26/2019   Procedure: PERIPHERAL VASCULAR INTERVENTION;  Surgeon: Marty Heck, MD;  Location: [place] [facility];  Service: Cardiovascular;;  Left SFA   PITUITARY SURGERY     TRANSTHORACIC ECHOCARDIOGRAM  08/30/2013   mild LVH. EF 50-55%. Moderate HK of the entire inferior myocardium. GR 1 DD. Mild LA dilation. Mildly reduced RV function   TYMPANOMASTOIDECTOMY Right 12/28/2017   Procedure: RIGHT TYMPANOMASTOIDECTOMY;  Surgeon: Leta Baptist, MD;  Location: [place];  Service: ENT;  Laterality: Right;    Immunization History  Administered Date(s) Administered   Moderna Sars-Covid-2 Vaccination 06/29/2019, 07/27/2019   Tdap 09/13/2019, 09/17/2020    MEDICATIONS/ALLERGIES   Current Meds  Medication Sig   aspirin EC 81 MG tablet Take 81 mg by mouth daily.   atorvastatin (LIPITOR) 40 MG tablet Take 1 tablet (40 mg total) by mouth daily at 6 PM.   B-D UF III MINI PEN NEEDLES 31G X 5 MM MISC Inject into the skin.   ciprofloxacin-dexamethasone (CIPRODEX) OTIC suspension SMARTSIG:4 Drop(s) Right Ear Twice Daily   clopidogrel (PLAVIX) 75 MG tablet Take 1 tablet by mouth once daily with breakfast   Continuous Blood Gluc Sensor (FREESTYLE LIBRE 2 SENSOR) MISC Apply topically every 14 (fourteen) days.   dexamethasone (DECADRON) 0.5 MG tablet Take 0.5 mg by mouth daily.   FARXIGA 10 MG TABS tablet  Take 10 mg by mouth daily.    ferrous sulfate 325 (65 FE) MG tablet Take 325 mg by mouth daily with breakfast.   Insulin Glargine (BASAGLAR KWIKPEN) 100 UNIT/ML SOPN Inject 48 Units into the skin at bedtime. inject 52 units into skin at bedtime   levothyroxine (SYNTHROID, LEVOTHROID) 175 MCG tablet Take 175 mcg by mouth daily before breakfast.   lisinopril (ZESTRIL) 10 MG tablet Take 1 tablet (10 mg total) by mouth daily.   metoprolol tartrate (LOPRESSOR) 25 MG tablet TAKE 1 TABLET TWICE A DAY   NOVOLOG FLEXPEN 100 UNIT/ML FlexPen Inject 5-14 Units into the skin 3 (three) times daily with meals. Per sliding scale   nystatin cream (MYCOSTATIN) Apply 1 application topically 2 (two) times daily.   Omega-3 Fatty Acids (FISH OIL) 1000 MG CAPS Take 1,000 mg by mouth daily.   ONETOUCH VERIO test strip USE 1 STRIP TO CHECK GLUCOSE THREE TIMES DAILY   pantoprazole (PROTONIX) 40 MG tablet Take 1 tablet (40 mg total) by mouth 2 (two) times daily before a meal.    Allergies  Allergen Reactions   Strawberry Extract Itching, Swelling and Anaphylaxis    Mouth swells and gets itchy    SOCIAL HISTORY/FAMILY HISTORY   Reviewed in Epic:  Pertinent findings:  Social History   Tobacco Use   Smoking status: Former    Pack years: 0.00   Smokeless tobacco: Never  Substance Use Topics   Alcohol use: No   Drug use: No   Social History   Social History Narrative   Widow. Works at [employer].   Former smoker.   Overall not very active.  Hoping to get into water aerobics class.    OBJCTIVE -PE, EKG, labs   Wt Readings from Last 3 Encounters:  09/14/20 235 lb (106.6 kg)  07/02/20 236 lb 3.2 oz (107.1 kg)  04/04/20 226 lb (102.5 kg)    Physical Exam: BP 128/68 (BP Location: Left Arm, Patient Position: Sitting, Cuff Size: Normal)  Pulse 90   Resp 18   Ht 5\' 7"  (1.702 m)   Wt 235 lb (106.6 kg)   SpO2 96%   BMI 36.81 kg/m  Physical Exam Constitutional:      General: She is not in  acute distress.    Appearance: Normal appearance. She is obese. She is not ill-appearing or toxic-appearing.     Comments: Well-groomed.  Healthy-appearing.  HENT:     Head: Normocephalic and atraumatic.  Neck:     Vascular: No carotid bruit or JVD.  Cardiovascular:     Rate and Rhythm: Normal rate and regular rhythm. No extrasystoles are present.    Chest Wall: PMI is not displaced.     Pulses: Decreased pulses (Decreased pedal pedal pulses bilaterally trace to 1+ DP/PT).     Heart sounds: S1 normal and S2 normal. Heart sounds are distant. No murmur heard.   No friction rub. No gallop.  Pulmonary:     Effort: Pulmonary effort is normal. No respiratory distress.     Breath sounds: Normal breath sounds. No wheezing, rhonchi or rales.  Chest:     Chest wall: No tenderness.  Musculoskeletal:        General: Swelling (Trivial bilateral) present. Normal range of motion.     Cervical back: Normal range of motion and neck supple.  Skin:    General: Skin is warm and dry.  Neurological:     General: No focal deficit present.     Mental Status: She is alert and oriented to person, place, and time.     Gait: Gait abnormal (Just a little bit favoring the left leg/foot; also antalgic from back pain).  Psychiatric:        Mood and Affect: Mood normal.        Behavior: Behavior normal.        Thought Content: Thought content normal.        Judgment: Judgment normal.     Comments: Overall in very good spirits     Adult ECG Report N/A  Recent Labs:   10/31/2019: TC 111, TG 87, HDL 40, LDL 52. Cr 0.8, K+ 4.1. 04/17/2020: A1c 6.1% No results found for: CHOL, HDL, LDLCALC, LDLDIRECT, TRIG, CHOLHDL Lab Results  Component Value Date   CREATININE 2.66 (H) 06/23/2019   BUN 40 (H) 06/23/2019   NA 137 06/23/2019   K 4.5 06/23/2019   CL 104 06/23/2019   CO2 23 06/23/2019   CBC Latest Ref Rng & Units 05/30/2019 05/27/2019 05/26/2019  WBC 4.0 - 10.5 K/uL 9.9 6.8 7.3  Hemoglobin 12.0 - 15.0 g/dL 9.9(L)  9.5(L) 9.6(L)  Hematocrit 36.0 - 46.0 % 32.6(L) 31.5(L) 31.4(L)  Platelets 150 - 400 K/uL 471(H) 450(H) 518(H)    No results found for: TSH  ==================================================  COVID-19 Education: The signs and symptoms of COVID-19 were discussed with the patient and how to seek care for testing (follow up with PCP or arrange E-visit).    I spent a total of 32 minutes with the patient spent in direct patient consultation.  Additional time spent with chart review  / charting (studies, outside notes, etc): 12 min Total Time: 44 min  Current medicines are reviewed at length with the patient today.  (+/- concerns) n/a  This visit occurred during the SARS-CoV-2 public health emergency.  Safety protocols were in place, including screening questions prior to the visit, additional usage of staff PPE, and extensive cleaning of exam room while observing appropriate contact time as indicated for  disinfecting solutions.  Notice: This dictation was prepared with Dragon dictation along with smaller phrase technology. Any transcriptional errors that result from this process are unintentional and may not be corrected upon review.  Patient Instructions / Medication Changes & Studies & Tests Ordered   Patient Instructions  Medication Instructions:  NO CHANGES   *If you need a refill on your cardiac medications before your next appointment, please call your pharmacy*   Lab Work:  NOT NEEDED   Testing/Procedures:  NOT NEEDED  Follow-Up: At [facility], you and your health needs are our priority.  As part of our continuing mission to provide you with exceptional heart care, we have created designated Provider Care Teams.  These Care Teams include your primary Cardiologist (physician) and Advanced Practice Providers (APPs -  Physician Assistants and Nurse Practitioners) who all work together to provide you with the care you need, when you need it.     Your next appointment:    12 month(s)  The format for your next appointment:   In Person  Provider:   Glenetta Hew, MD   Other Instructions  Your physician recommends that you schedule a follow-up appointment in: 1 MONTH WITH CVRR- BLOOD PRESSURE - MONITORING   Studies Ordered:   No orders of the defined types were placed in this encounter.    Glenetta Hew, M.D., M.S. Interventional Cardiologist   Pager # [phone] Phone # [phone] [address] [place], [place] [address]   Thank you for choosing Heartcare at [facility]!!

## 2020-09-17 ENCOUNTER — Encounter (HOSPITAL_COMMUNITY): Payer: Self-pay | Admitting: Emergency Medicine

## 2020-09-17 ENCOUNTER — Emergency Department (HOSPITAL_COMMUNITY)
Admission: EM | Admit: 2020-09-17 | Discharge: 2020-09-17 | Disposition: A | Discharge status: Home / Self Care | Payer: Medicare Other | Attending: Emergency Medicine | Admitting: Emergency Medicine

## 2020-09-17 ENCOUNTER — Ambulatory Visit: Payer: Medicare Other | Admitting: Podiatry

## 2020-09-17 ENCOUNTER — Other Ambulatory Visit: Payer: Self-pay

## 2020-09-17 DIAGNOSIS — S81811A Laceration without foreign body, right lower leg, initial encounter: Secondary | ICD-10-CM | POA: Insufficient documentation

## 2020-09-17 DIAGNOSIS — Z7984 Long term (current) use of oral hypoglycemic drugs: Secondary | ICD-10-CM | POA: Diagnosis not present

## 2020-09-17 DIAGNOSIS — Z79899 Other long term (current) drug therapy: Secondary | ICD-10-CM | POA: Insufficient documentation

## 2020-09-17 DIAGNOSIS — Z7902 Long term (current) use of antithrombotics/antiplatelets: Secondary | ICD-10-CM | POA: Insufficient documentation

## 2020-09-17 DIAGNOSIS — E119 Type 2 diabetes mellitus without complications: Secondary | ICD-10-CM | POA: Diagnosis not present

## 2020-09-17 DIAGNOSIS — W268XXA Contact with other sharp object(s), not elsewhere classified, initial encounter: Secondary | ICD-10-CM | POA: Insufficient documentation

## 2020-09-17 DIAGNOSIS — Z87891 Personal history of nicotine dependence: Secondary | ICD-10-CM | POA: Diagnosis not present

## 2020-09-17 DIAGNOSIS — Z23 Encounter for immunization: Secondary | ICD-10-CM | POA: Diagnosis not present

## 2020-09-17 DIAGNOSIS — I10 Essential (primary) hypertension: Secondary | ICD-10-CM | POA: Diagnosis not present

## 2020-09-17 DIAGNOSIS — E039 Hypothyroidism, unspecified: Secondary | ICD-10-CM | POA: Insufficient documentation

## 2020-09-17 DIAGNOSIS — Z794 Long term (current) use of insulin: Secondary | ICD-10-CM | POA: Insufficient documentation

## 2020-09-17 DIAGNOSIS — Z7982 Long term (current) use of aspirin: Secondary | ICD-10-CM | POA: Insufficient documentation

## 2020-09-17 DIAGNOSIS — S8991XA Unspecified injury of right lower leg, initial encounter: Secondary | ICD-10-CM | POA: Diagnosis present

## 2020-09-17 DIAGNOSIS — I2510 Atherosclerotic heart disease of native coronary artery without angina pectoris: Secondary | ICD-10-CM | POA: Insufficient documentation

## 2020-09-17 MED ORDER — LIDOCAINE-EPINEPHRINE (PF) 2 %-1:200000 IJ SOLN
20.0000 mL | Freq: Once | INTRAMUSCULAR | Status: AC
  Administered 2020-09-17: 20 mL
  Filled 2020-09-17: qty 20

## 2020-09-17 MED ORDER — TETANUS-DIPHTH-ACELL PERTUSSIS 5-2.5-18.5 LF-MCG/0.5 IM SUSY
0.5000 mL | PREFILLED_SYRINGE | Freq: Once | INTRAMUSCULAR | Status: AC
  Administered 2020-09-17: 0.5 mL via INTRAMUSCULAR
  Filled 2020-09-17: qty 0.5

## 2020-09-17 NOTE — ED Provider Notes (Signed)
[facility] EMERGENCY DEPARTMENT Provider Note   CSN: [id number] Arrival date & time: 09/17/20  5852     History No chief complaint on file.   Tammy Good is a 70 y.o. female.  Presents to ER with concern for laceration.  Laceration occurred last night approximately 6 PM.  States her right lower leg suffered a cut on her car door.  She cleaned it off last night with water and hydrogen peroxide.  Wanted it checked out this morning.  No ongoing bleeding.  She denies any other injuries, walking without difficulty.  Unsure of last tetanus.  HPI     Past Medical History:  Diagnosis Date   CAD S/P percutaneous coronary angioplasty 08/2013   100% mRCA - PCI Integrity Resolute DES 3.0 mm x 38 mm - 3.35 mm; PTCA of RPA V 2.0 mm x 15 mm   Cholesteatoma    right   Diabetes mellitus type 2 in obese (HCC)    On insulin and Invokana   History of osteomyelitis L 5th Toe all 05/2019   s/p Partial Ray Amputation with partial closure; 6 wks Abx & LSFA Atherectomy/DEB PTA with Stent for focal dissection.   Hyperlipidemia with target LDL less than 70    Hypothyroidism (acquired)    Mild essential hypertension    Obesity (BMI 30-39.9) 11/17/2013   PAD (peripheral artery disease) ([place]) 05/26/2019   05/26/19: Abd AoGram- BLE runoff -> L SFA orbital atherectomy - PTA w/ DEB & Stent 6 x 40 Luttonix (for focal dissection) - patent Pop A with 3 V runoff. LEA Dopplers 01/03/2020: RABI (prev) 0.68 (0.53)/ RTBI (prev) 0.58 (0.33); LABI (prev) 0.80 (0.64), LTBI (prev) 0.64 (0.51); R mSFA ~50-74%, L mSFA 50-74%. 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 ([place]) 77/82/4235   Atherosclerotic heart disease of native coronary artery without angina pectoris 03/20/2020   Epigastric pain 03/20/2020   Nausea and vomiting 03/20/2020   Absence of toe ([place]) 06/28/2019   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   PAD (peripheral artery disease) ([place]) 05/26/2019   Osteomyelitis of left foot ([place]) 05/24/2019   Osteomyelitis ([place]) 05/23/2019   Cellulitis and abscess of foot, except toes 05/18/2019   Chronic osteomyelitis of ankle and foot ([place]) 05/18/2019   Cholesteatoma 02/24/2019   Cellulitis and abscess of toe 11/09/2018   Colon cancer screening 11/30/2017   Long term (current) use of insulin ([place]) 03/05/2017   Iron deficiency anemia 12/10/2014   Obesity (BMI 30-39.9) 11/17/2013   Diabetes mellitus type 2 in obese [facility])    Essential hypertension    Right thigh pain 11/01/2013   PVC's (premature ventricular contractions) 11/01/2013   Status post insertion of drug eluting coronary artery stent to [facility] emergently and PTCA to prox. PLA 09/16/2013   Hyperlipidemia associated with type 2 diabetes mellitus ([place]) 09/16/2013   Presence of coronary angioplasty implant and graft  09/08/2013   Panhypopituitarism ([place]) 08/30/2013   ST elevation myocardial infarction (STEMI) of inferior wall, subsequent episode of care [facility]) 08/22/2013   CAD S/P percutaneous coronary angioplasty 08/22/2013   Hyperlipidemia 05/18/2009    Past Surgical History:  Procedure Laterality Date   ABDOMINAL AORTOGRAM W/LOWER EXTREMITY N/A 05/26/2019   Procedure: ABDOMINAL AORTOGRAM W/LOWER EXTREMITY;  Surgeon: Marty Heck, MD;  Location: [place] CV LAB;  Service: Cardiovascular;  Laterality: N/A;   AMPUTATION Left 05/25/2019    Procedure: AMPUTATION RAY 5th;  Surgeon: Trula Slade, DPM;  Location: [place];  Service: Podiatry;  Laterality: Left;   Cardiac Event Monitor  July-August 2015   Sinus rhythm with PVCs   COLONOSCOPY N/A 08/31/2013   Procedure: COLONOSCOPY;  Surgeon: Juanita Craver, MD;  Location: [facility] ENDOSCOPY;  Service: Endoscopy;  Laterality: N/A;   ESOPHAGOGASTRODUODENOSCOPY N/A 09/01/2013   Procedure: ESOPHAGOGASTRODUODENOSCOPY (EGD);  Surgeon: Beryle Beams, MD;  Location: [facility] ENDOSCOPY;  Service: Endoscopy;  Laterality: N/A;  bedside   LEFT HEART CATHETERIZATION WITH CORONARY ANGIOGRAM N/A 08/30/2013   Procedure: LEFT HEART CATHETERIZATION WITH CORONARY ANGIOGRAM;  Surgeon: Leonie Man, MD;  Location: [facility] CATH LAB: 100% mRCA (thrombus - extends to RPAV), 80% D1, 40% AVG Cx.   PERCUTANEOUS CORONARY STENT INTERVENTION (PCI-S)  08/30/2013   Procedure: PERCUTANEOUS CORONARY STENT INTERVENTION (PCI-S);  Surgeon: Leonie Man, MD;  Location: [facility] CATH LAB;  Integrity Resolute DES 2.0 mm x 38 mm -- 3.35 mm.; PTCA of proximal RPA V. - 3.0 mm x 15 mm balloon   PERIPHERAL VASCULAR ATHERECTOMY  05/26/2019   Procedure: PERIPHERAL VASCULAR ATHERECTOMY;  Surgeon: Marty Heck, MD;  Location: [place] CV LAB;  Service: Cardiovascular;;  Left SFA   PERIPHERAL VASCULAR INTERVENTION  05/26/2019   Procedure: PERIPHERAL VASCULAR INTERVENTION;  Surgeon: Marty Heck, MD;  Location: [place] CV LAB;  Service: Cardiovascular;;  Left SFA   PITUITARY SURGERY     TRANSTHORACIC ECHOCARDIOGRAM  08/30/2013   mild LVH. EF 50-55%. Moderate HK of the entire inferior myocardium. GR 1 DD. Mild LA dilation. Mildly reduced RV function   TYMPANOMASTOIDECTOMY Right 12/28/2017   Procedure: RIGHT TYMPANOMASTOIDECTOMY;  Surgeon: Leta Baptist, MD;  Location: [place];  Service: ENT;  Laterality: Right;     OB History   No obstetric history on file.     Family History  Problem Relation Age of Onset    Cancer Mother 61       multiple myeloma   Heart attack Father 15   Cancer Sister    Alzheimer's disease Maternal Grandmother     Social History   Tobacco Use   Smoking status: Former    Pack years: 0.00   Smokeless tobacco: Never  Substance Use Topics   Alcohol use: No   Drug use: No    Home Medications Prior to Admission medications   Medication Sig Start Date End Date Taking? Authorizing Provider  aspirin EC 81 MG tablet Take 81 mg by mouth daily.    [provider]  atorvastatin (LIPITOR) 40 MG tablet Take 1 tablet (40 mg total) by mouth daily at 6 PM. 09/03/13   Thereasa Solo, Kimberlee Nearing, MD  B-D UF III MINI PEN NEEDLES 31G X 5 MM MISC Inject into the skin. 05/28/20   [provider]  ciprofloxacin-dexamethasone (CIPRODEX) OTIC suspension SMARTSIG:4 Drop(s) Right Ear Twice Daily 08/20/20   [provider]  clopidogrel (PLAVIX) 75 MG tablet Take 1 tablet by mouth once daily  with breakfast 07/27/20   Marty Heck, MD  Continuous Blood Gluc Sensor (FREESTYLE LIBRE 2 SENSOR) MISC Apply topically every 14 (fourteen) days. 06/12/20   [provider]  dexamethasone (DECADRON) 0.5 MG tablet Take 0.5 mg by mouth daily. 09/03/13   Cherene Altes, MD  FARXIGA 10 MG TABS tablet Take 10 mg by mouth daily.  07/25/15   [provider]  ferrous sulfate 325 (65 FE) MG tablet Take 325 mg by mouth daily with breakfast.    [provider]  Insulin Glargine (BASAGLAR KWIKPEN) 100 UNIT/ML SOPN Inject 48 Units into the skin at bedtime. inject 52 units into skin at bedtime 07/11/15   [provider]  levothyroxine (SYNTHROID, LEVOTHROID) 175 MCG tablet Take 175 mcg by mouth daily before breakfast.    [provider]  lisinopril (ZESTRIL) 10 MG tablet Take 1 tablet (10 mg total) by mouth daily. 12/01/19   [place], Peter M, MD  metoprolol tartrate (LOPRESSOR) 25 MG tablet TAKE 1 TABLET TWICE A DAY 02/04/17   Leonie Man, MD  NOVOLOG  FLEXPEN 100 UNIT/ML FlexPen Inject 5-14 Units into the skin 3 (three) times daily with meals. Per sliding scale 01/03/19   [provider]  nystatin cream (MYCOSTATIN) Apply 1 application topically 2 (two) times daily. 08/29/19   Carlyle Basques, MD  Omega-3 Fatty Acids (FISH OIL) 1000 MG CAPS Take 1,000 mg by mouth daily.    [provider]  [facility] VERIO test strip USE 1 STRIP TO [place] DAILY 12/24/17   [provider]  pantoprazole (PROTONIX) 40 MG tablet Take 1 tablet (40 mg total) by mouth 2 (two) times daily before a meal. 09/03/13   Thereasa Solo, Kimberlee Nearing, MD    Allergies    Strawberry extract  Review of Systems   Review of Systems  Skin:  Positive for wound.  All other systems reviewed and are negative.  Physical Exam Updated Vital Signs BP (!) 143/51   Pulse 84   Temp 98.2 F (36.8 C) (Oral)   Resp 17   SpO2 100%   Physical Exam Vitals and nursing note reviewed.  Constitutional:      General: She is not in acute distress.    Appearance: She is well-developed.  HENT:     Head: Normocephalic and atraumatic.  Eyes:     Conjunctiva/sclera: Conjunctivae normal.  Cardiovascular:     Rate and Rhythm: Normal rate.     Pulses: Normal pulses.  Pulmonary:     Effort: Pulmonary effort is normal. No respiratory distress.  Musculoskeletal:     Cervical back: Neck supple.     Comments: Right lower extremity: There is 6 cm long laceration by up to 2 cm wide over the right mid lower leg, anterior lateral aspect, no active bleeding noted, full skin thickness, no fascia or muscle involvement, distal pulses, cap refill, motor and sensation all intact  Skin:    General: Skin is warm and dry.  Neurological:     General: No focal deficit present.     Mental Status: She is alert.     Comments: Normal sensation in right lower extremity  Psychiatric:        Mood and Affect: Mood normal.    ED Results / Procedures / Treatments   Labs (all labs  ordered are listed, but only abnormal results are displayed) Labs Reviewed - No data to display  EKG None  Radiology No results found.  Procedures .Marland KitchenLaceration Repair  Date/Time: 09/17/2020 9:45 AM Performed by: Lucrezia Starch, MD Authorized by: Lucrezia Starch, MD   Consent:    Consent obtained:  Verbal   Consent given by:  Patient   Risks, benefits, and alternatives were discussed: yes     Risks discussed:  Infection, need for additional repair, nerve damage, poor wound healing, poor cosmetic result, pain, retained foreign body, tendon damage and vascular damage   Alternatives discussed:  No treatment, delayed treatment, observation and referral Universal protocol:    Immediately prior to procedure, a time out was called: yes     Patient identity confirmed:  Verbally with patient Anesthesia:    Anesthesia method:  Local infiltration   Local anesthetic:  Lidocaine 2% WITH epi Laceration details:    Location:  Leg   Leg location:  R lower leg   Length (cm):  6 Pre-procedure details:    Preparation:  Patient was prepped and draped in usual sterile fashion Exploration:    Hemostasis achieved with:  Direct pressure   Wound exploration: wound explored through full range of motion     Wound extent: no fascia violation noted, no muscle damage noted, no tendon damage noted and no vascular damage noted     Contaminated: no   Treatment:    Area cleansed with:  Saline and povidone-iodine   Amount of cleaning:  Extensive   Irrigation solution:  Sterile saline   Irrigation method:  Syringe   Debridement:  None   Layers/structures repaired:  Deep subcutaneous Deep subcutaneous:    Suture size:  4-0   Suture material:  Vicryl   Suture technique:  Simple interrupted   Number of sutures:  2 Skin repair:    Repair method:  Sutures   Suture size:  3-0   Suture material:  Nylon   Suture technique:  Horizontal mattress   Number of sutures:  5 Approximation:     Approximation:  Loose Repair type:    Repair type:  Intermediate Post-procedure details:    Dressing:  Sterile dressing   Procedure completion:  Tolerated   Medications Ordered in ED Medications  Tdap (BOOSTRIX) injection 0.5 mL (0.5 mLs Intramuscular Given 09/17/20 0847)  lidocaine-EPINEPHrine (XYLOCAINE W/EPI) 2 %-1:200000 (PF) injection 20 mL (20 mLs Infiltration Given 09/17/20 4854)    ED Course  I have reviewed the triage vital signs and the nursing notes.  Pertinent labs & imaging results that were available during my care of the patient were reviewed by me and considered in my medical decision making (see chart for details).    MDM Rules/Calculators/A&P                          70 year old lady presents to ER with concern for right leg laceration.  Full skin thickness, no deeper tissues involved.  Performed laceration repair, counseled on wound care and follow-up with PCP for wound recheck and eventual suture removal.  Due to frail skin, skin tension on the anterior surface of the lower leg, had some difficulty with achieving close wound approximation.  Ultimately there was a small area where the wound was not able to be closed fully at center of wound, this will need to heal by secondary intention.  Most of the wound however did have good approximation with eversion.  Utilized a couple deep subcutaneous sutures and horizontal mattress sutures.   After the discussed management above, the patient was determined to be safe for discharge.  The patient  was in agreement with this plan and all questions regarding their care were answered.  ED return precautions were discussed and the patient will return to the ED with any significant worsening of condition.   Final Clinical Impression(s) / ED Diagnoses Final diagnoses:  Laceration of right lower extremity, initial encounter    Rx / DC Orders ED Discharge Orders     None        Lucrezia Starch, MD 09/17/20 [phone]

## 2020-09-17 NOTE — ED Triage Notes (Signed)
Pt here with c/o right leg lac/skin tear happened yesterday around 6 pm , was hit on a car door

## 2020-09-17 NOTE — Discharge Instructions (Addendum)
Please change dressing every day or every other day.  Recommend using gauze dressing and using a petroleum or Xeroform gauze on the wound itself.  Please keep clean and dry.  Please schedule an appointment with your primary care doctor for wound recheck.  You should have your sutures removed in 10 to 14 days or otherwise instructed by primary doctor.

## 2020-09-20 ENCOUNTER — Ambulatory Visit: Payer: Medicare Other | Admitting: Podiatry

## 2020-09-20 ENCOUNTER — Encounter: Payer: Self-pay | Admitting: Cardiology

## 2020-09-20 NOTE — Assessment & Plan Note (Signed)
Now followed again by Vascular Surgery.   She is on aspirin, Plavix -> Would Defer to Vascular Surgery for timing of whether or not she can stop Plavix.  I would imagine that she would be fine based to hold 5 days preop for surgical procedures because of being more than a year out from her procedure.  Also on the risk factor modification similar to CAD.  Statin, beta-blocker and ACE inhibitor.

## 2020-09-20 NOTE — Assessment & Plan Note (Addendum)
Doing well.  No active angina.  On stable regimen.  Plan:  Lipids well controlled as of last year on current dose of statin-she is due for lab follow-up with Dr. Forde Dandy this fall.  On metoprolol 25 mg twice daily -> she does have plenty of room to titrate this further based on resting heart rate of 90 bpm.  On ACE inhibitor-low threshold to increase to 20 mg.  On Farxiga  On DAPT following peripheral vascular procedure.  Could probably stop aspirin, but will defer to vascular surgery.  Again, defer to vascular surgery for questions about potentially stopping Plavix for surgeries or procedures.

## 2020-09-20 NOTE — Assessment & Plan Note (Signed)
She is now 7 years out from her MI with PCI to the RCA.  Post MI follow-up echocardiogram showed relatively normal/low normal EF with moderate inferior hypokinesis consistent with MI.  Has not had any further anginal symptoms, or any CHF symptoms.[name]

## 2020-09-20 NOTE — Assessment & Plan Note (Signed)
Asymptomatic on current dose of beta-blocker.  She does having to titrate up further if necessary for additional BP control

## 2020-09-20 NOTE — Assessment & Plan Note (Signed)
Well-controlled LDL of 52 last August.  Should be due for labs recheck soon by Dr. Forde Dandy who is managing both diabetes and lipids..  Plan: Continue current dose of atorvastatin.  She is on Farxiga and insulin with a Freestyle Elenor Legato' monitor.

## 2020-09-20 NOTE — Assessment & Plan Note (Signed)
When I last saw her, her BP was a little elevated.  Today it seems normal, but she says that at home is higher.  Low threshold to increase lisinopril back to 20 mg.  I have asked her to monitor pressures at home and bring her blood pressure cuff in with her for [facility] ([facility]) BP check.  If indicated, would titrate lisinopril back to 20 mg.

## 2020-09-20 NOTE — Assessment & Plan Note (Signed)
She is 7 years out from RCA PCI.  Over 1 year out from her peripheral vascular intervention.  From a cardiac standpoint, okay to hold aspirin and/or Plavix 5 to 7 days preop for surgical procedures.  At this point would defer to vascular surgery, but likely okay to hold.

## 2020-09-25 ENCOUNTER — Other Ambulatory Visit: Payer: Self-pay

## 2020-09-25 ENCOUNTER — Ambulatory Visit (INDEPENDENT_AMBULATORY_CARE_PROVIDER_SITE_OTHER): Payer: Medicare Other | Admitting: Orthopedic Surgery

## 2020-09-25 ENCOUNTER — Encounter: Payer: Self-pay | Admitting: Orthopedic Surgery

## 2020-09-25 DIAGNOSIS — I2510 Atherosclerotic heart disease of native coronary artery without angina pectoris: Secondary | ICD-10-CM | POA: Diagnosis not present

## 2020-09-25 DIAGNOSIS — D6862 Lupus anticoagulant syndrome: Secondary | ICD-10-CM

## 2020-09-25 DIAGNOSIS — L03115 Cellulitis of right lower limb: Secondary | ICD-10-CM | POA: Diagnosis not present

## 2020-09-25 DIAGNOSIS — Z9861 Coronary angioplasty status: Secondary | ICD-10-CM

## 2020-09-25 DIAGNOSIS — O99111 Other diseases of the blood and blood-forming organs and certain disorders involving the immune mechanism complicating pregnancy, first trimester: Secondary | ICD-10-CM

## 2020-09-25 DIAGNOSIS — S81801A Unspecified open wound, right lower leg, initial encounter: Secondary | ICD-10-CM

## 2020-09-25 NOTE — Progress Notes (Signed)
Office Visit Note   Patient: Tammy Good           Date of Birth: 1950-06-29           MRN: [id number] Visit Date: 09/25/2020              Requested by: [name], MD [address] [place],  [place] [address] PCP: [name], MD  Chief Complaint  Patient presents with   Right Leg - Pain    ER 09/17/20 RLE laceration       HPI: Patient is a 70 year old woman who is seen for initial evaluation from referral from Dr. Jill Poling office.  Patient has diabetes peripheral vascular disease hypertension status post cardiac stents and status post a stent to the right lower extremity who sustained a traumatic avulsion injury to her right leg when she struck her leg on the car door.  She was initially went to the emergency room this was sutured and patient subsequently followed up with Dr. Forde Dandy.  Patient is currently on Plavix and aspirin she is on Decadron and is a smoker.  Assessment & Plan: Visit Diagnoses:  1. Cellulitis of leg, right   2. Traumatic open wound of right lower leg, initial encounter   3. 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  Patient states she would like to proceed with the compression stocking she will go to [facility] to obtain the stocking a sample sock was applied today she states this felt good she will change the sock daily and wash the leg with soap and water daily continue with elevation.  She will start doxycycline.  Follow-Up Instructions: Return in about 1 week (around 10/02/2020) for Follow-up weekly for the next 2 weeks on Monday.   Ortho Exam  Patient is alert, oriented, no adenopathy, well-dressed, normal affect, normal respiratory effort. Examination patient has venous pitting edema to the right lower extremity there is cellulitis around the traumatic  wound with a wound that is approximately 5 cm in diameter with necrosis of the torn skin and fibrinous exudative tissue covering the base of the wound.  There is surrounding cellulitis.  She has pitting edema in the leg and foot.  Patient does not have a palpable pulse due to the swelling the Doppler was used and she has a biphasic dorsalis pedis and posterior tibial pulse with inline flow to the ankle status post revascularization with a stent placement.  Patient's calf measures 35 cm in circumference.  Most recent hemoglobin A1c is not recorded.  Imaging: No results found. No images are attached to the encounter.  Labs: Lab Results  Component Value Date   HGBA1C 6.9 (H) 09/02/2013   ESRSEDRATE 52 (H) 08/30/2013   REPTSTATUS 05/30/2019 FINAL 05/25/2019   GRAMSTAIN NO WBC SEEN NO ORGANISMS SEEN  05/25/2019   CULT  05/25/2019    RARE METHICILLIN RESISTANT STAPHYLOCOCCUS AUREUS RESULT CALLED TO, READ BACK BY AND VERIFIED WITH: RN J BERIS [id number] MLM NO ANAEROBES ISOLATED Performed at [facility], [place] [address], [place], [place] [address]    LABORGA METHICILLIN RESISTANT STAPHYLOCOCCUS AUREUS 05/25/2019     Lab Results  Component Value Date   ALBUMIN 2.8 (L) 05/23/2019   ALBUMIN 3.8 09/16/2013   ALBUMIN 3.3 (L) 08/29/2013    Lab Results  Component Value Date  MG 1.6 (L) 05/30/2019   MG 1.6 (L) 05/27/2019   MG 2.1 08/29/2013   No results found for: VD25OH  No results found for: PREALBUMIN CBC EXTENDED Latest Ref Rng & Units 05/30/2019 05/27/2019 05/26/2019  WBC 4.0 - 10.5 K/uL 9.9 6.8 7.3  RBC 3.87 - 5.11 MIL/uL 3.43(L) 3.36(L) 3.33(L)  HGB 12.0 - 15.0 g/dL 9.9(L) 9.5(L) 9.6(L)  HCT 36.0 - 46.0 % 32.6(L) 31.5(L) 31.4(L)  PLT 150 - 400 K/uL 471(H) 450(H) 518(H)  NEUTROABS 1.7 - 7.7 K/uL 7.3 4.0 -  LYMPHSABS 0.7 - 4.0 K/uL 1.3 1.6 -     There is no height or weight on file to calculate BMI.  Orders:  No orders of the defined types were placed in this  encounter.  No orders of the defined types were placed in this encounter.    Procedures: No procedures performed  Clinical Data: No additional findings.  ROS:  All other systems negative, except as noted in the HPI. Review of Systems  Objective: Vital Signs: There were no vitals taken for this visit.  Specialty Comments:  No specialty comments available.  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   PAD (peripheral artery disease) ([place]) 05/26/2019   Osteomyelitis of left foot ([place]) 05/24/2019   Osteomyelitis ([place]) 05/23/2019   Cellulitis and abscess of foot, except toes 05/18/2019   Chronic osteomyelitis of ankle and foot ([place]) 05/18/2019   Cholesteatoma 02/24/2019   Cellulitis and abscess of toe 11/09/2018   Colon cancer screening 11/30/2017   Long term (current) use of insulin ([place]) 03/05/2017   Iron deficiency anemia 12/10/2014   Obesity (BMI 30-39.9) 11/17/2013   Diabetes mellitus type 2 in obese [facility])    Essential hypertension    Right thigh pain 11/01/2013   PVC's (premature ventricular contractions) 11/01/2013   Status post insertion of drug eluting coronary artery stent to [facility] emergently and PTCA to prox. 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   ST elevation myocardial infarction (STEMI) of inferior wall, subsequent episode of care [facility]) 08/22/2013   CAD S/P percutaneous coronary angioplasty 08/22/2013   Past Medical History:  Diagnosis Date    CAD S/P percutaneous coronary angioplasty 08/2013   100% mRCA - PCI Integrity Resolute DES 3.0 mm x 38 mm - 3.35 mm; PTCA of RPA V 2.0 mm x 15 mm   Cholesteatoma    right   Diabetes mellitus type 2 in obese (HCC)    On insulin and Invokana   History of osteomyelitis L 5th Toe all 05/2019   s/p Partial [place] Amputation with partial closure; 6 wks Abx & LSFA Atherectomy/DEB PTA with Stent for focal dissection.   Hyperlipidemia with target LDL less than 70    Hypothyroidism (acquired)    Mild essential hypertension    Obesity (BMI 30-39.9) 11/17/2013   PAD (peripheral artery disease) ([place]) 05/26/2019   05/26/19: Abd AoGram- BLE runoff -> L SFA orbital atherectomy - PTA w/ DEB & Stent 6 x 40 Luttonix (for focal dissection) - patent Pop  A with 3 V runoff. LEA Dopplers 01/03/2020: RABI (prev) 0.68 (0.53)/ RTBI (prev) 0.58 (0.33); LABI (prev) 0.80 (0.64), LTBI (prev) 0.64 (0.51); R mSFA ~50-74%, L mSFA 50-74%. Patent Prox SFA stent < 49% stenosis   Panhypopituitarism (HCC)    ST elevation myocardial infarction (STEMI) of inferior wall, subsequent episode of care ([place]) 08/2013   80% branch of D1, 40% mid AV groove circumflex, 100% RCA with subacute thrombus -- thrombus extending into RPA V with 100% occlusion after initial angioplasty of mid RCA ;; Post MI ECHO 6/9/'15: EF 50-55%, mild LVH with moderate HK of inferior wall, Gr1 DD, mild LA dilation; mildly reduced RV function    Family History  Problem Relation Age of Onset   Cancer Mother 29       multiple myeloma   Heart attack Father 95   Cancer Sister    Alzheimer's disease Maternal Grandmother     Past Surgical History:  Procedure Laterality Date   ABDOMINAL AORTOGRAM W/LOWER EXTREMITY N/A 05/26/2019   Procedure: ABDOMINAL AORTOGRAM W/LOWER EXTREMITY;  Surgeon: Marty Heck, MD;  Location: [place] [facility];  Service: Cardiovascular;  Laterality: N/A;   AMPUTATION Left 05/25/2019   Procedure: AMPUTATION RAY 5th;  Surgeon: Trula Slade, DPM;  Location: [place];  Service: Podiatry;  Laterality: Left;   Cardiac Event Monitor  July-August 2015   Sinus rhythm with PVCs   COLONOSCOPY N/A 08/31/2013   Procedure: COLONOSCOPY;  Surgeon: Juanita Craver, MD;  Location: [facility] ENDOSCOPY;  Service: Endoscopy;  Laterality: N/A;   ESOPHAGOGASTRODUODENOSCOPY N/A 09/01/2013   Procedure: ESOPHAGOGASTRODUODENOSCOPY (EGD);  Surgeon: Beryle Beams, MD;  Location: [facility] ENDOSCOPY;  Service: Endoscopy;  Laterality: N/A;  bedside   LEFT HEART CATHETERIZATION WITH CORONARY ANGIOGRAM N/A 08/30/2013   Procedure: LEFT HEART CATHETERIZATION WITH CORONARY ANGIOGRAM;  Surgeon: Leonie Man, MD;  Location: [facility] CATH LAB: 100% mRCA (thrombus - extends to RPAV), 80% D1, 40% AVG Cx.   PERCUTANEOUS CORONARY STENT INTERVENTION (PCI-S)  08/30/2013   Procedure: PERCUTANEOUS CORONARY STENT INTERVENTION (PCI-S);  Surgeon: Leonie Man, MD;  Location: [facility] CATH LAB;  Integrity Resolute DES 2.0 mm x 38 mm -- 3.35 mm.; PTCA of proximal RPA V. - 3.0 mm x 15 mm balloon   PERIPHERAL VASCULAR ATHERECTOMY  05/26/2019   Procedure: PERIPHERAL VASCULAR ATHERECTOMY;  Surgeon: Marty Heck, MD;  Location: [place] [facility];  Service: Cardiovascular;;  Left SFA   PERIPHERAL VASCULAR INTERVENTION  05/26/2019   Procedure: PERIPHERAL VASCULAR INTERVENTION;  Surgeon: Marty Heck, MD;  Location: [place] [facility];  Service: Cardiovascular;;  Left SFA   PITUITARY SURGERY     TRANSTHORACIC ECHOCARDIOGRAM  08/30/2013   mild LVH. EF 50-55%. Moderate HK of the entire inferior myocardium. GR 1 DD. Mild LA dilation. Mildly reduced RV function   TYMPANOMASTOIDECTOMY Right 12/28/2017   Procedure: RIGHT TYMPANOMASTOIDECTOMY;  Surgeon: Leta Baptist, MD;  Location: [place];  Service: ENT;  Laterality: Right;   Social History   Occupational History   Not on file  Tobacco Use   Smoking status: Former    Pack years: 0.00   Smokeless tobacco: Never  Substance  and Sexual Activity   Alcohol use: No   Drug use: No   Sexual activity: Not Currently    Birth control/protection: Post-menopausal

## 2020-09-26 NOTE — Progress Notes (Signed)
HISTORY AND PHYSICAL     CC:  follow up. Requesting Provider:  [name], MD  HPI: This is a 70 y.o. female who is here today for follow up for PAD.  She is s/p proximal left SFA atherectomy and stenting by [name] on 05/26/19. Prior to her intervention she had undergone a left 5th ray amputation on 05/25/2019 by her Podiatrist Dr. Earleen Newport. At the time of her visit with PA-C Arlee Muslim in October of 2021 she had fully healed from her amputation.   Pt was last seen 07/02/2020 and at that time, she was doing well without claudication, rest pain or non healing wounds.  She was mainly limited by her lower back on the right.    The pt returns today for ABI and arterial duplex.  She states that she has been doing well and does not have any claudication or rest pain.  She does not have any non healing wounds on the left foot.  She states she did hit her right leg with a car door and has been followed by [name].  He put her in a compression sock and she states the wound is improving.    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   The pt is on a statin for cholesterol management.    The pt is on an aspirin.    Other AC:  none The pt is on ACEI for hypertension.  The pt does have diabetes. Tobacco hx:  former  Pt does not have family hx of AAA.  Past Medical History:  Diagnosis Date   CAD S/P percutaneous coronary angioplasty 08/2013   100% mRCA - PCI Integrity Resolute DES 3.0 mm x 38 mm - 3.35 mm; PTCA of RPA V 2.0 mm x 15 mm   Cholesteatoma    right   Diabetes mellitus type 2 in obese (HCC)    On insulin and Invokana   History of osteomyelitis L 5th Toe all 05/2019   s/p Partial [place] Amputation with partial closure; 6 wks Abx & LSFA Atherectomy/DEB PTA with Stent for focal dissection.   Hyperlipidemia with target LDL less than 70    Hypothyroidism (acquired)    Mild essential hypertension    Obesity (BMI 30-39.9) 11/17/2013   PAD (peripheral artery  disease) ([place]) 05/26/2019   05/26/19: Abd AoGram- BLE runoff -> L SFA orbital atherectomy - PTA w/ DEB & Stent 6 x 40 Luttonix (for focal dissection) - patent Pop A with 3 V runoff. LEA Dopplers 01/03/2020: RABI (prev) 0.68 (0.53)/ RTBI (prev) 0.58 (0.33); LABI (prev) 0.80 (0.64), LTBI (prev) 0.64 (0.51); R mSFA ~50-74%, L mSFA 50-74%. Patent Prox SFA stent < 49% stenosis   Panhypopituitarism (HCC)    ST elevation myocardial infarction (STEMI) of inferior wall, subsequent episode of care ([place]) 08/2013   80% branch of D1, 40% mid AV groove circumflex, 100% RCA with subacute thrombus -- thrombus extending into RPA V with 100% occlusion after initial angioplasty of mid RCA ;; Post MI ECHO 6/9/'15: EF 50-55%, mild LVH with moderate HK of inferior wall, Gr1 DD, mild LA dilation; mildly reduced RV function    Past Surgical History:  Procedure Laterality Date   ABDOMINAL AORTOGRAM W/LOWER EXTREMITY N/A 05/26/2019   Procedure: ABDOMINAL AORTOGRAM W/LOWER EXTREMITY;  Surgeon: Marty Heck, MD;  Location: [place] [facility];  Service: Cardiovascular;  Laterality: N/A;   AMPUTATION Left 05/25/2019   Procedure: AMPUTATION RAY 5th;  Surgeon: [name],  Bonna Gains, DPM;  Location: [place];  Service: Podiatry;  Laterality: Left;   Cardiac Event Monitor  July-August 2015   Sinus rhythm with PVCs   COLONOSCOPY N/A 08/31/2013   Procedure: COLONOSCOPY;  Surgeon: Juanita Craver, MD;  Location: [facility] ENDOSCOPY;  Service: Endoscopy;  Laterality: N/A;   ESOPHAGOGASTRODUODENOSCOPY N/A 09/01/2013   Procedure: ESOPHAGOGASTRODUODENOSCOPY (EGD);  Surgeon: Beryle Beams, MD;  Location: [facility] ENDOSCOPY;  Service: Endoscopy;  Laterality: N/A;  bedside   LEFT HEART CATHETERIZATION WITH CORONARY ANGIOGRAM N/A 08/30/2013   Procedure: LEFT HEART CATHETERIZATION WITH CORONARY ANGIOGRAM;  Surgeon: Leonie Man, MD;  Location: [facility] CATH LAB: 100% mRCA (thrombus - extends to RPAV), 80% D1, 40% AVG Cx.   PERCUTANEOUS CORONARY STENT  INTERVENTION (PCI-S)  08/30/2013   Procedure: PERCUTANEOUS CORONARY STENT INTERVENTION (PCI-S);  Surgeon: Leonie Man, MD;  Location: [facility] CATH LAB;  Integrity Resolute DES 2.0 mm x 38 mm -- 3.35 mm.; PTCA of proximal RPA V. - 3.0 mm x 15 mm balloon   PERIPHERAL VASCULAR ATHERECTOMY  05/26/2019   Procedure: PERIPHERAL VASCULAR ATHERECTOMY;  Surgeon: Marty Heck, MD;  Location: [place] [facility];  Service: Cardiovascular;;  Left SFA   PERIPHERAL VASCULAR INTERVENTION  05/26/2019   Procedure: PERIPHERAL VASCULAR INTERVENTION;  Surgeon: Marty Heck, MD;  Location: [place] [facility];  Service: Cardiovascular;;  Left SFA   PITUITARY SURGERY     TRANSTHORACIC ECHOCARDIOGRAM  08/30/2013   mild LVH. EF 50-55%. Moderate HK of the entire inferior myocardium. GR 1 DD. Mild LA dilation. Mildly reduced RV function   TYMPANOMASTOIDECTOMY Right 12/28/2017   Procedure: RIGHT TYMPANOMASTOIDECTOMY;  Surgeon: Leta Baptist, MD;  Location: [place];  Service: ENT;  Laterality: Right;    Allergies  Allergen Reactions   Strawberry Extract Itching, Swelling and Anaphylaxis    Mouth swells and gets itchy    Current Outpatient Medications  Medication Sig Dispense Refill   aspirin EC 81 MG tablet Take 81 mg by mouth daily.     atorvastatin (LIPITOR) 40 MG tablet Take 1 tablet (40 mg total) by mouth daily at 6 PM. 30 tablet 0   B-D UF III MINI PEN NEEDLES 31G X 5 MM MISC Inject into the skin.     ciprofloxacin-dexamethasone (CIPRODEX) OTIC suspension SMARTSIG:4 Drop(s) Right Ear Twice Daily     clopidogrel (PLAVIX) 75 MG tablet Take 1 tablet by mouth once daily with breakfast 90 tablet 0   Continuous Blood Gluc Sensor (FREESTYLE LIBRE 2 SENSOR) MISC Apply topically every 14 (fourteen) days.     dexamethasone (DECADRON) 0.5 MG tablet Take 0.5 mg by mouth daily.     FARXIGA 10 MG TABS tablet Take 10 mg by mouth daily.      ferrous sulfate 325 (65 FE) MG tablet Take 325 mg by mouth  daily with breakfast.     Insulin Glargine (BASAGLAR KWIKPEN) 100 UNIT/ML SOPN Inject 48 Units into the skin at bedtime. inject 52 units into skin at bedtime     levothyroxine (SYNTHROID, LEVOTHROID) 175 MCG tablet Take 175 mcg by mouth daily before breakfast.     lisinopril (ZESTRIL) 10 MG tablet Take 1 tablet (10 mg total) by mouth daily. 90 tablet 3   metoprolol tartrate (LOPRESSOR) 25 MG tablet TAKE 1 TABLET TWICE A DAY 180 tablet 2   NOVOLOG FLEXPEN 100 UNIT/ML FlexPen Inject 5-14 Units into the skin 3 (three) times daily with meals. Per sliding scale     nystatin cream (MYCOSTATIN)  Apply 1 application topically 2 (two) times daily. 30 g 1   Omega-3 Fatty Acids (FISH OIL) 1000 MG CAPS Take 1,000 mg by mouth daily.     ONETOUCH VERIO test strip USE 1 STRIP TO CHECK GLUCOSE THREE TIMES DAILY  1   pantoprazole (PROTONIX) 40 MG tablet Take 1 tablet (40 mg total) by mouth 2 (two) times daily before a meal. 60 tablet 0   No current facility-administered medications for this visit.    Family History  Problem Relation Age of Onset   Cancer Mother 52       multiple myeloma   Heart attack Father 82   Cancer Sister    Alzheimer's disease Maternal Grandmother     Social History   Socioeconomic History   Marital status: Widowed    Spouse name: Not on file   Number of children: Not on file   Years of education: Not on file   Highest education level: Not on file  Occupational History   Not on file  Tobacco Use   Smoking status: Former    Pack years: 0.00   Smokeless tobacco: Never  Substance and Sexual Activity   Alcohol use: No   Drug use: No   Sexual activity: Not Currently    Birth control/protection: Post-menopausal  Other Topics Concern   Not on file  Social History Narrative   Widow. Works at [employer].   Former smoker.   Overall not very active.  Hoping to get into water aerobics class.   Social Determinants of Health   Financial Resource Strain: Not on file   Food Insecurity: Not on file  Transportation Needs: Not on file  Physical Activity: Not on file  Stress: Not on file  Social Connections: Not on file  Intimate Partner Violence: Not on file     REVIEW OF SYSTEMS:   '[X]'  denotes positive finding, '[ ]'  denotes negative finding Cardiac  Comments:  Chest pain or chest pressure:    Shortness of breath upon exertion:    Short of breath when lying flat:    Irregular heart rhythm:        Vascular    Pain in calf, thigh, or hip brought on by ambulation:    Pain in feet at night that wakes you up from your sleep:     Blood clot in your veins:    Leg swelling:         Pulmonary    Oxygen at home:    Productive cough:     Wheezing:         Neurologic    Sudden weakness in arms or legs:     Sudden numbness in arms or legs:     Sudden onset of difficulty speaking or slurred speech:    Temporary loss of vision in one eye:     Problems with dizziness:         Gastrointestinal    Blood in stool:     Vomited blood:         Genitourinary    Burning when urinating:     Blood in urine:        Psychiatric    Major depression:         Hematologic    Bleeding problems:    Problems with blood clotting too easily:        Skin    Rashes or ulcers:        Constitutional  Fever or chills:      PHYSICAL EXAMINATION:  Today's Vitals   10/01/20 1049  BP: 139/67  Pulse: (!) 56  Resp: 20  Temp: 97.9 F (36.6 C)  SpO2: 98%  Weight: 235 lb (106.6 kg)  Height: '5\' 7"'  (1.702 m)   Body mass index is 36.81 kg/m.   General:  WDWN in NAD; vital signs documented above Gait: Not observed HENT: WNL, normocephalic Pulmonary: normal non-labored breathing , without wheezing Cardiac: regular HR, without  Murmur; without carotid bruits Abdomen: soft, NT, no masses; aortic pulse is not palpable Skin: without rashes Vascular Exam/Pulses:  Right Left  Radial 2+ (normal) 2+ (normal)  Popliteal Unable to palpate Unable to palpate  DP  Unable to palpate (Compression sock in place) 2+ (normal)  PT Unable to palpate Unable to palpate   Extremities: without ischemic changes, without Gangrene , without cellulitis; without open wounds;  Musculoskeletal: no muscle wasting or atrophy  Neurologic: A&O X 3;  No focal weakness or paresthesias are detected Psychiatric:  The pt has Normal affect.   Non-Invasive Vascular Imaging:   ABI's/TBI's on 10/01/2020: Right:  0.53/0.40 - Great toe pressure: 67 Left:  0.72/0.55 - Great toe pressure: 91  LLE Arterial duplex on 10/01/2020: Left: 50-74% stenosis noted in the common femoral artery. 50-74% stenosis noted in the deep femoral artery. 50-74% stenosis noted in the mid superficial femoral artery. Patent proximal superficial femoral artery stent with velocities suggesting a 1-49% stenosis.  Previous ABI's/TBI's on 07/02/2020: Right:  0.52/0.50 - Great toe pressure: 91 Left:  0.80/0.64 - Great toe pressure:  117  Previous arterial duplex on 07/02/2020: Summary:  Left: 50-74% stenosis noted in the common femoral artery. 50-74% stenosis noted in the deep femoral artery. Left SFA stent 1-49% stenosis.   ASSESSMENT/PLAN:: 70 y.o. female here for follow up for /p proximal left SFA atherectomy and stenting by [name] on 05/26/19. Prior to her intervention she had undergone a left 5th ray amputation on 05/25/2019 by her Podiatrist Dr. Earleen Newport.   -pt with palpable left DP pulse.  Her ABI slightly decreased on the left but stable bilaterally.  She does continue to have 50-74% stenosis of the left distal CFA, DFA and mid SFA. Her stent is patent with 1-49% stenosis.  She has biphasic waveforms.  Given her palpable DP pulse and she is asymptomatic, will check this again in 3 months. -she does have a wound that was sutured in the ER and she was sent to [name] for further management.  He has placed her in compression and she states the wound is improving.  She did not want to remove the sock again.   Discussed with her that given her ABI on the right is 0.53, if the wound does not heal, she may need an arteriogram if [name] feels this is necessary.  Will check this in 3 months when she returns unless she has issues sooner. -continue asa/plavix/statin    Leontine Locket, [facility] [phone]  Clinic MD:   Carlis Abbott on call MD

## 2020-10-01 ENCOUNTER — Ambulatory Visit (INDEPENDENT_AMBULATORY_CARE_PROVIDER_SITE_OTHER): Payer: Medicare Other | Admitting: Physician Assistant

## 2020-10-01 ENCOUNTER — Ambulatory Visit (INDEPENDENT_AMBULATORY_CARE_PROVIDER_SITE_OTHER)
Admission: RE | Admit: 2020-10-01 | Discharge: 2020-10-01 | Disposition: A | Discharge status: Home / Self Care | Payer: Medicare Other | Source: Ambulatory Visit | Attending: Physician Assistant | Admitting: Physician Assistant

## 2020-10-01 ENCOUNTER — Ambulatory Visit: Payer: BC Managed Care – PPO | Admitting: Physician Assistant

## 2020-10-01 ENCOUNTER — Ambulatory Visit (HOSPITAL_COMMUNITY)
Admission: RE | Admit: 2020-10-01 | Discharge: 2020-10-01 | Disposition: A | Discharge status: Home / Self Care | Payer: Medicare Other | Source: Ambulatory Visit | Attending: Physician Assistant | Admitting: Physician Assistant

## 2020-10-01 ENCOUNTER — Other Ambulatory Visit: Payer: Self-pay

## 2020-10-01 VITALS — BP 139/67 | HR 56 | Temp 97.9°F | Resp 20 | Ht 67.0 in | Wt 235.0 lb

## 2020-10-01 DIAGNOSIS — Z9861 Coronary angioplasty status: Secondary | ICD-10-CM | POA: Diagnosis not present

## 2020-10-01 DIAGNOSIS — I739 Peripheral vascular disease, unspecified: Secondary | ICD-10-CM

## 2020-10-01 DIAGNOSIS — I2510 Atherosclerotic heart disease of native coronary artery without angina pectoris: Secondary | ICD-10-CM

## 2020-10-02 ENCOUNTER — Ambulatory Visit (INDEPENDENT_AMBULATORY_CARE_PROVIDER_SITE_OTHER): Payer: Medicare Other | Admitting: Physician Assistant

## 2020-10-02 ENCOUNTER — Encounter: Payer: Self-pay | Admitting: Physician Assistant

## 2020-10-02 DIAGNOSIS — M86172 Other acute osteomyelitis, left ankle and foot: Secondary | ICD-10-CM

## 2020-10-02 NOTE — Progress Notes (Signed)
Office Visit Note   Patient: Tammy Good           Date of Birth: March 20, 1951           MRN: [id number] Visit Date: 10/02/2020              Requested by: [name], MD [address] [place],  [place] [address] PCP: [name], MD  Chief Complaint  Patient presents with   Right Leg - Follow-up      HPI: Patient is a pleasant 70 year old woman who follows up for the right lower extremity laceration she is now approximately 2 weeks since the injury.  She has been on doxycycline and has been compliant with doing daily dressing changes consisting of her medical compression sock she feels it looks slightly better  Assessment & Plan: Visit Diagnoses: No diagnosis found.  Plan: Patient will follow up in 1 week continue current plan  Follow-Up Instructions: No follow-ups on file.   Ortho Exam  Patient is alert, oriented, no adenopathy, well-dressed, normal affect, normal respiratory effort. She has significant wrinkling of the skin calf is soft she has no cellulitis.  Wound has about 60% fibrinous tissue tissue around the periphery of the wound is healthy exudative tissue with bleeding.  Did clean away some of the fibrinous tissue to more exudative tissue.  No surrounding cellulitis.  Imaging: No results found. No images are attached to the encounter.  Labs: Lab Results  Component Value Date   HGBA1C 6.9 (H) 09/02/2013   ESRSEDRATE 52 (H) 08/30/2013   REPTSTATUS 05/30/2019 FINAL 05/25/2019   GRAMSTAIN NO WBC SEEN NO ORGANISMS SEEN  05/25/2019   CULT  05/25/2019    RARE METHICILLIN RESISTANT STAPHYLOCOCCUS AUREUS RESULT CALLED TO, READ BACK BY AND VERIFIED WITH: RN J BERIS [phone] MLM NO ANAEROBES ISOLATED Performed at [facility], [place] [address], [place], [place] [address]    LABORGA METHICILLIN RESISTANT STAPHYLOCOCCUS AUREUS 05/25/2019     Lab Results  Component Value Date   ALBUMIN 2.8 (L) 05/23/2019   ALBUMIN 3.8 09/16/2013   ALBUMIN 3.3  (L) 08/29/2013    Lab Results  Component Value Date   MG 1.6 (L) 05/30/2019   MG 1.6 (L) 05/27/2019   MG 2.1 08/29/2013   No results found for: VD25OH  No results found for: PREALBUMIN CBC EXTENDED Latest Ref Rng & Units 05/30/2019 05/27/2019 05/26/2019  WBC 4.0 - 10.5 K/uL 9.9 6.8 7.3  RBC 3.87 - 5.11 MIL/uL 3.43(L) 3.36(L) 3.33(L)  HGB 12.0 - 15.0 g/dL 9.9(L) 9.5(L) 9.6(L)  HCT 36.0 - 46.0 % 32.6(L) 31.5(L) 31.4(L)  PLT 150 - 400 K/uL 471(H) 450(H) 518(H)  NEUTROABS 1.7 - 7.7 K/uL 7.3 4.0 -  LYMPHSABS 0.7 - 4.0 K/uL 1.3 1.6 -     There is no height or weight on file to calculate BMI.  Orders:  No orders of the defined types were placed in this encounter.  No orders of the defined types were placed in this encounter.    Procedures: No procedures performed  Clinical Data: No additional findings.  ROS:  All other systems negative, except as noted in the HPI. Review of Systems  Objective: Vital Signs: There were no vitals taken for this visit.  Specialty Comments:  No specialty comments available.  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)    Essential hypertension    Right thigh pain 11/01/2013   PVC's (premature ventricular contractions) 11/01/2013   Status post insertion of drug eluting coronary artery stent to [facility] emergently and PTCA to prox. 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   ST elevation myocardial infarction (STEMI) of inferior wall, subsequent episode of care [facility]) 08/22/2013   CAD S/P percutaneous coronary angioplasty 08/22/2013   Past Medical History:  Diagnosis Date   CAD S/P percutaneous coronary angioplasty 08/2013   100% mRCA - PCI Integrity Resolute DES 3.0 mm x 38 mm - 3.35 mm; PTCA of RPA V 2.0 mm x 15 mm   Cholesteatoma    right   Diabetes mellitus type 2 in obese (HCC)    On insulin and Invokana   History of osteomyelitis L 5th Toe all 05/2019   s/p Partial Ray Amputation with partial closure; 6 wks Abx & LSFA Atherectomy/DEB PTA with Stent for focal dissection.   Hyperlipidemia with target LDL less than 70    Hypothyroidism (acquired)    Mild essential hypertension    Obesity (BMI 30-39.9) 11/17/2013   PAD (peripheral artery disease) ([place]) 05/26/2019   05/26/19: Abd AoGram- BLE runoff -> L SFA orbital atherectomy - PTA w/ DEB & Stent 6 x 40 Luttonix (for focal dissection) - patent Pop A with 3 V runoff. LEA Dopplers 01/03/2020: RABI (prev) 0.68 (0.53)/ RTBI (prev) 0.58 (0.33); LABI (prev) 0.80 (0.64), LTBI (prev) 0.64 (0.51); R mSFA ~50-74%, L mSFA 50-74%. Patent Prox SFA stent < 49% stenosis   Panhypopituitarism (HCC)    ST elevation myocardial infarction (STEMI) of inferior wall, subsequent episode of care ([place]) 08/2013   80% branch of D1, 40% mid AV groove circumflex, 100% RCA with subacute thrombus -- thrombus extending into RPA V with 100% occlusion after initial angioplasty of mid RCA ;; Post  MI ECHO 6/9/'15: EF 50-55%, mild LVH with moderate HK of inferior wall, Gr1 DD, mild LA dilation; mildly reduced RV function    Family History  Problem Relation Age of Onset   Cancer Mother 66       multiple myeloma   Heart attack Father 84   Cancer Sister    Alzheimer's disease Maternal Grandmother     Past Surgical History:  Procedure Laterality Date   ABDOMINAL AORTOGRAM W/LOWER EXTREMITY N/A 05/26/2019   Procedure: ABDOMINAL AORTOGRAM W/LOWER EXTREMITY;  Surgeon: Marty Heck, MD;  Location: [place] CV LAB;  Service: Cardiovascular;  Laterality: N/A;   AMPUTATION Left 05/25/2019   Procedure: AMPUTATION RAY 5th;  Surgeon: Trula Slade, DPM;  Location: [place];  Service: Podiatry;  Laterality: Left;  Cardiac Event Monitor  July-August 2015   Sinus rhythm with PVCs   COLONOSCOPY N/A 08/31/2013   Procedure: COLONOSCOPY;  Surgeon: Juanita Craver, MD;  Location: [facility] ENDOSCOPY;  Service: Endoscopy;  Laterality: N/A;   ESOPHAGOGASTRODUODENOSCOPY N/A 09/01/2013   Procedure: ESOPHAGOGASTRODUODENOSCOPY (EGD);  Surgeon: Beryle Beams, MD;  Location: [facility] ENDOSCOPY;  Service: Endoscopy;  Laterality: N/A;  bedside   LEFT HEART CATHETERIZATION WITH CORONARY ANGIOGRAM N/A 08/30/2013   Procedure: LEFT HEART CATHETERIZATION WITH CORONARY ANGIOGRAM;  Surgeon: Leonie Man, MD;  Location: [facility] CATH LAB: 100% mRCA (thrombus - extends to RPAV), 80% D1, 40% AVG Cx.   PERCUTANEOUS CORONARY STENT INTERVENTION (PCI-S)  08/30/2013   Procedure: PERCUTANEOUS CORONARY STENT INTERVENTION (PCI-S);  Surgeon: Leonie Man, MD;  Location: [facility] CATH LAB;  Integrity Resolute DES 2.0 mm x 38 mm -- 3.35 mm.; PTCA of proximal RPA V. - 3.0 mm x 15 mm balloon   PERIPHERAL VASCULAR ATHERECTOMY  05/26/2019   Procedure: PERIPHERAL VASCULAR ATHERECTOMY;  Surgeon: Marty Heck, MD;  Location: [place] CV LAB;  Service: Cardiovascular;;  Left SFA   PERIPHERAL VASCULAR INTERVENTION  05/26/2019   Procedure:  PERIPHERAL VASCULAR INTERVENTION;  Surgeon: Marty Heck, MD;  Location: [place] CV LAB;  Service: Cardiovascular;;  Left SFA   PITUITARY SURGERY     TRANSTHORACIC ECHOCARDIOGRAM  08/30/2013   mild LVH. EF 50-55%. Moderate HK of the entire inferior myocardium. GR 1 DD. Mild LA dilation. Mildly reduced RV function   TYMPANOMASTOIDECTOMY Right 12/28/2017   Procedure: RIGHT TYMPANOMASTOIDECTOMY;  Surgeon: Leta Baptist, MD;  Location: [place];  Service: ENT;  Laterality: Right;   Social History   Occupational History   Not on file  Tobacco Use   Smoking status: Former    Pack years: 0.00   Smokeless tobacco: Never  Substance and Sexual Activity   Alcohol use: No   Drug use: No   Sexual activity: Not Currently    Birth control/protection: Post-menopausal

## 2020-10-03 ENCOUNTER — Other Ambulatory Visit: Payer: Self-pay

## 2020-10-03 DIAGNOSIS — I739 Peripheral vascular disease, unspecified: Secondary | ICD-10-CM

## 2020-10-08 ENCOUNTER — Encounter: Payer: Self-pay | Admitting: Orthopedic Surgery

## 2020-10-08 ENCOUNTER — Ambulatory Visit (INDEPENDENT_AMBULATORY_CARE_PROVIDER_SITE_OTHER): Payer: Medicare Other | Admitting: Physician Assistant

## 2020-10-08 DIAGNOSIS — I2510 Atherosclerotic heart disease of native coronary artery without angina pectoris: Secondary | ICD-10-CM

## 2020-10-08 DIAGNOSIS — Z9861 Coronary angioplasty status: Secondary | ICD-10-CM

## 2020-10-08 DIAGNOSIS — L03115 Cellulitis of right lower limb: Secondary | ICD-10-CM | POA: Diagnosis not present

## 2020-10-08 NOTE — Progress Notes (Signed)
Office Visit Note   Patient: Tammy Good           Date of Birth: 04-18-1950           MRN: [id number] Visit Date: 10/08/2020              Requested by: [name], [place],  [place] [address] PCP: [name], MD  Chief Complaint  Patient presents with   Right Leg - Follow-up      HPI: Patient is here in follow up for her Right Lower extremity laceration . She feels she is doing a little better. She is wearing her VIVE sock and cleansing daily  Assessment & Plan: Visit Diagnoses: No diagnosis found.  Plan: continue with VIVE stocking and cleansing. Follow up in 1 week  Follow-Up Instructions: No follow-ups on file.   Ortho Exam  Patient is alert, oriented, no adenopathy, well-dressed, normal affect, normal respiratory effort.Right lower extremity : No cellulitis, minimal soft tissue swelling. Wound has about 50 percent fibrinous and 50 percent healthy bleeding tissue. Small necrotic area which was debrided to healty bleeding tissue. Depth of wound is about 1 mm. No purulent drainage, no surrounding or ascending cellulitis  Imaging: No results found. No images are attached to the encounter.  Labs: Lab Results  Component Value Date   HGBA1C 6.9 (H) 09/02/2013   ESRSEDRATE 52 (H) 08/30/2013   REPTSTATUS 05/30/2019 FINAL 05/25/2019   GRAMSTAIN NO WBC SEEN NO ORGANISMS SEEN  05/25/2019   CULT  05/25/2019    RARE METHICILLIN RESISTANT STAPHYLOCOCCUS AUREUS RESULT CALLED TO, READ BACK BY AND VERIFIED WITH: RN J BERIS [id number] MLM NO ANAEROBES ISOLATED Performed at [facility], [place] [address], [place], [place] [address]    LABORGA METHICILLIN RESISTANT STAPHYLOCOCCUS AUREUS 05/25/2019     Lab Results  Component Value Date   ALBUMIN 2.8 (L) 05/23/2019   ALBUMIN 3.8 09/16/2013   ALBUMIN 3.3 (L) 08/29/2013    Lab Results  Component Value Date   MG 1.6 (L) 05/30/2019   MG 1.6 (L) 05/27/2019   MG 2.1 08/29/2013   No  results found for: VD25OH  No results found for: PREALBUMIN CBC EXTENDED Latest Ref Rng & Units 05/30/2019 05/27/2019 05/26/2019  WBC 4.0 - 10.5 K/uL 9.9 6.8 7.3  RBC 3.87 - 5.11 MIL/uL 3.43(L) 3.36(L) 3.33(L)  HGB 12.0 - 15.0 g/dL 9.9(L) 9.5(L) 9.6(L)  HCT 36.0 - 46.0 % 32.6(L) 31.5(L) 31.4(L)  PLT 150 - 400 K/uL 471(H) 450(H) 518(H)  NEUTROABS 1.7 - 7.7 K/uL 7.3 4.0 -  LYMPHSABS 0.7 - 4.0 K/uL 1.3 1.6 -     There is no height or weight on file to calculate BMI.  Orders:  No orders of the defined types were placed in this encounter.  No orders of the defined types were placed in this encounter.    Procedures: No procedures performed  Clinical Data: No additional findings.  ROS:  All other systems negative, except as noted in the HPI. Review of Systems  Objective: Vital Signs: There were no vitals taken for this visit.  Specialty Comments:  No specialty comments available.  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   PAD (peripheral artery disease) ([place]) 05/26/2019   Osteomyelitis of left foot ([place]) 05/24/2019   Osteomyelitis ([place]) 05/23/2019   Cellulitis and abscess of foot, except toes 05/18/2019   Chronic osteomyelitis of ankle and foot ([place]) 05/18/2019   Cholesteatoma 02/24/2019   Cellulitis and abscess of toe 11/09/2018   Colon cancer screening 11/30/2017   Long term (current) use of insulin ([place]) 03/05/2017   Iron deficiency anemia 12/10/2014   Obesity (BMI 30-39.9) 11/17/2013   Diabetes mellitus type 2 in obese [facility])    Essential hypertension    Right thigh pain 11/01/2013   PVC's (premature  ventricular contractions) 11/01/2013   Status post insertion of drug eluting coronary artery stent to [facility] emergently and PTCA to prox. 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   ST elevation myocardial infarction (STEMI) of inferior wall, subsequent episode of care [facility]) 08/22/2013   CAD S/P percutaneous coronary angioplasty 08/22/2013   Past Medical History:  Diagnosis Date   CAD S/P percutaneous coronary angioplasty 08/2013   100% mRCA - PCI Integrity Resolute DES 3.0 mm x 38 mm - 3.35 mm; PTCA of RPA V 2.0 mm x 15 mm   Cholesteatoma    right   Diabetes mellitus type 2 in obese (HCC)    On insulin and Invokana   History of osteomyelitis L 5th Toe all 05/2019   s/p Partial Ray Amputation with partial closure; 6 wks Abx & LSFA Atherectomy/DEB PTA with Stent for focal dissection.   Hyperlipidemia with target LDL less than 70    Hypothyroidism (acquired)    Mild essential hypertension    Obesity (BMI 30-39.9) 11/17/2013   PAD (peripheral artery disease) ([place]) 05/26/2019   05/26/19: Abd AoGram- BLE runoff -> L SFA orbital atherectomy - PTA w/ DEB & Stent 6 x 40 Luttonix (for focal dissection) - patent Pop A with 3 V runoff. LEA Dopplers 01/03/2020: RABI (prev) 0.68 (0.53)/ RTBI (prev) 0.58 (0.33); LABI (prev) 0.80 (0.64), LTBI (prev) 0.64 (0.51); R mSFA ~50-74%, L mSFA 50-74%. Patent Prox SFA stent < 49% stenosis   Panhypopituitarism (HCC)    ST elevation myocardial infarction (STEMI) of inferior wall, subsequent episode of care ([place]) 08/2013   80% branch of D1, 40% mid AV groove circumflex, 100% RCA with subacute thrombus -- thrombus extending into RPA V with 100% occlusion after initial angioplasty of mid RCA ;; Post MI ECHO 6/9/'15: EF 50-55%, mild LVH with moderate HK of inferior wall, Gr1 DD, mild LA dilation; mildly reduced RV function     Family History  Problem Relation Age of Onset   Cancer Mother 66       multiple myeloma   Heart attack Father 36   Cancer Sister    Alzheimer's disease Maternal Grandmother     Past Surgical History:  Procedure Laterality Date   ABDOMINAL AORTOGRAM W/LOWER EXTREMITY N/A 05/26/2019   Procedure: ABDOMINAL AORTOGRAM W/LOWER EXTREMITY;  Surgeon: Marty Heck, MD;  Location: [place] CV LAB;  Service: Cardiovascular;  Laterality: N/A;   AMPUTATION Left 05/25/2019   Procedure: AMPUTATION RAY 5th;  Surgeon: Trula Slade, DPM;  Location: [place];  Service: Podiatry;  Laterality: Left;   Cardiac Event Monitor  July-August 2015   Sinus rhythm with PVCs   COLONOSCOPY N/A 08/31/2013   Procedure: COLONOSCOPY;  Surgeon:  Juanita Craver, MD;  Location: [place];  Service: Endoscopy;  Laterality: N/A;   ESOPHAGOGASTRODUODENOSCOPY N/A 09/01/2013   Procedure: ESOPHAGOGASTRODUODENOSCOPY (EGD);  Surgeon: Beryle Beams, MD;  Location: [facility] ENDOSCOPY;  Service: Endoscopy;  Laterality: N/A;  bedside   LEFT HEART CATHETERIZATION WITH CORONARY ANGIOGRAM N/A 08/30/2013   Procedure: LEFT HEART CATHETERIZATION WITH CORONARY ANGIOGRAM;  Surgeon: Leonie Man, MD;  Location: [facility] CATH LAB: 100% mRCA (thrombus - extends to RPAV), 80% D1, 40% AVG Cx.   PERCUTANEOUS CORONARY STENT INTERVENTION (PCI-S)  08/30/2013   Procedure: PERCUTANEOUS CORONARY STENT INTERVENTION (PCI-S);  Surgeon: Leonie Man, MD;  Location: [facility] CATH LAB;  Integrity Resolute DES 2.0 mm x 38 mm -- 3.35 mm.; PTCA of proximal RPA V. - 3.0 mm x 15 mm balloon   PERIPHERAL VASCULAR ATHERECTOMY  05/26/2019   Procedure: PERIPHERAL VASCULAR ATHERECTOMY;  Surgeon: Marty Heck, MD;  Location: [place] CV LAB;  Service: Cardiovascular;;  Left SFA   PERIPHERAL VASCULAR INTERVENTION  05/26/2019   Procedure: PERIPHERAL VASCULAR INTERVENTION;  Surgeon: Marty Heck, MD;  Location: [place] CV LAB;  Service: Cardiovascular;;   Left SFA   PITUITARY SURGERY     TRANSTHORACIC ECHOCARDIOGRAM  08/30/2013   mild LVH. EF 50-55%. Moderate HK of the entire inferior myocardium. GR 1 DD. Mild LA dilation. Mildly reduced RV function   TYMPANOMASTOIDECTOMY Right 12/28/2017   Procedure: RIGHT TYMPANOMASTOIDECTOMY;  Surgeon: Leta Baptist, MD;  Location: [place];  Service: ENT;  Laterality: Right;   Social History   Occupational History   Not on file  Tobacco Use   Smoking status: Former   Smokeless tobacco: Never  Substance and Sexual Activity   Alcohol use: No   Drug use: No   Sexual activity: Not Currently    Birth control/protection: Post-menopausal

## 2020-10-15 ENCOUNTER — Ambulatory Visit (INDEPENDENT_AMBULATORY_CARE_PROVIDER_SITE_OTHER): Payer: Medicare Other | Admitting: Orthopedic Surgery

## 2020-10-15 ENCOUNTER — Other Ambulatory Visit: Payer: Self-pay

## 2020-10-15 DIAGNOSIS — Z9861 Coronary angioplasty status: Secondary | ICD-10-CM

## 2020-10-15 DIAGNOSIS — S81801A Unspecified open wound, right lower leg, initial encounter: Secondary | ICD-10-CM | POA: Diagnosis not present

## 2020-10-15 DIAGNOSIS — I2510 Atherosclerotic heart disease of native coronary artery without angina pectoris: Secondary | ICD-10-CM | POA: Diagnosis not present

## 2020-10-16 ENCOUNTER — Ambulatory Visit (INDEPENDENT_AMBULATORY_CARE_PROVIDER_SITE_OTHER): Payer: Medicare Other | Admitting: Pharmacist

## 2020-10-16 VITALS — BP 141/79 | HR 82 | Resp 14 | Ht 67.0 in | Wt 232.4 lb

## 2020-10-16 DIAGNOSIS — I2119 ST elevation (STEMI) myocardial infarction involving other coronary artery of inferior wall: Secondary | ICD-10-CM

## 2020-10-16 DIAGNOSIS — I10 Essential (primary) hypertension: Secondary | ICD-10-CM | POA: Diagnosis not present

## 2020-10-16 MED ORDER — LISINOPRIL 10 MG PO TABS: 15.0000 mg | ORAL_TABLET | Freq: Every day | ORAL | 2 refills | Status: DC

## 2020-10-16 NOTE — Patient Instructions (Addendum)
It was nice meeting you today!  We would like your blood pressure to be less than 130/80  Continue your metoprolol '25mg'$  twice a day We will increase your lisinopril to '15mg'$  once a day (1 and 1/2 tablets)  Watch for signs and symptoms of low blood pressure such as dizziness or lightheadedness  Continue to monitor your blood pressure at home and call us with any questions  Karren Cobble, PharmD, BCACP, CDCES, [place] [id number] [place], [place] [address] Phone: [phone]; Fax: [phone] 10/16/2020 9:11 AM

## 2020-10-16 NOTE — Progress Notes (Signed)
Patient ID: Tammy Good                 DOB: 10/13/1950                      MRN: [id number]     HPI: Tammy Good is a 70 y.o. female referred by Dr. Ellyn Hack to HTN clinic. PMH is significant for STEMI, PAD, CAD, DM (6.1%), HTN, HLD, and obesity.  Patient seen by Dr Ellyn Hack on 09/14/20 and BP was controlled in room, however home readings elevated.  Patient referred to HTN clinic for review.  Patient presents today in good spirits.  Has automatic BP cuff that she uses occasionally.  Cuff was given to her by [facility].  Used home BP cuff in room: 127/75 however cuff may be small.  Most meals she preps at home.  Does not add any salt to food.  Eats vegetables, salads, baked/grilled/or boiled fish and chicken.  Typically a Glucerna shake for lunch.  Is currently unable to exercise well due to amputation on left food and current injury on right leg which is being treated by ortho.  Reports compliance with metoprolol '25mg'$  BID and lisinopril '10mg'$  daily.  Reports when she was on lisinopril '20mg'$  she would occasionally feel dizzy and lightheaded so dose was reduced.    Current HTN meds: lisinopril '10mg'$ , metoprolol '25mg'$  BID,  BP goal: <130/80  Family History: Patient unsure, only knows of history of DM  Social History: Denies tobacco and alcohol use   Wt Readings from Last 3 Encounters:  10/01/20 235 lb (106.6 kg)  09/14/20 235 lb (106.6 kg)  07/02/20 236 lb 3.2 oz (107.1 kg)   BP Readings from Last 3 Encounters:  10/01/20 139/67  09/17/20 (!) 143/51  09/14/20 128/68   Pulse Readings from Last 3 Encounters:  10/01/20 (!) 56  09/17/20 84  09/14/20 90    Renal function: CrCl cannot be calculated (Patient's most recent lab result is older than the maximum 21 days allowed.).  Past Medical History:  Diagnosis Date   CAD S/P percutaneous coronary angioplasty 08/2013   100% mRCA - PCI Integrity Resolute DES 3.0 mm x 38 mm - 3.35 mm; PTCA of RPA V 2.0 mm x 15 mm   Cholesteatoma     right   Diabetes mellitus type 2 in obese (HCC)    On insulin and Invokana   History of osteomyelitis L 5th Toe all 05/2019   s/p Partial Ray Amputation with partial closure; 6 wks Abx & LSFA Atherectomy/DEB PTA with Stent for focal dissection.   Hyperlipidemia with target LDL less than 70    Hypothyroidism (acquired)    Mild essential hypertension    Obesity (BMI 30-39.9) 11/17/2013   PAD (peripheral artery disease) ([place]) 05/26/2019   05/26/19: Abd AoGram- BLE runoff -> L SFA orbital atherectomy - PTA w/ DEB & Stent 6 x 40 Luttonix (for focal dissection) - patent Pop A with 3 V runoff. LEA Dopplers 01/03/2020: RABI (prev) 0.68 (0.53)/ RTBI (prev) 0.58 (0.33); LABI (prev) 0.80 (0.64), LTBI (prev) 0.64 (0.51); R mSFA ~50-74%, L mSFA 50-74%. Patent Prox SFA stent < 49% stenosis   Panhypopituitarism (HCC)    ST elevation myocardial infarction (STEMI) of inferior wall, subsequent episode of care ([place]) 08/2013   80% branch of D1, 40% mid AV groove circumflex, 100% RCA with subacute thrombus -- thrombus extending into RPA V with 100% occlusion after initial angioplasty of mid RCA ;; Post MI ECHO  6/9/'15: EF 50-55%, mild LVH with moderate HK of inferior wall, Gr1 DD, mild LA dilation; mildly reduced RV function    Current Outpatient Medications on File Prior to Visit  Medication Sig Dispense Refill   aspirin EC 81 MG tablet Take 81 mg by mouth daily.     atorvastatin (LIPITOR) 40 MG tablet Take 1 tablet (40 mg total) by mouth daily at 6 PM. 30 tablet 0   B-D UF III MINI PEN NEEDLES 31G X 5 MM MISC Inject into the skin.     ciprofloxacin-dexamethasone (CIPRODEX) OTIC suspension SMARTSIG:4 Drop(s) Right Ear Twice Daily     clopidogrel (PLAVIX) 75 MG tablet Take 1 tablet by mouth once daily with breakfast 90 tablet 0   Continuous Blood Gluc Sensor (FREESTYLE LIBRE 2 SENSOR) MISC Apply topically every 14 (fourteen) days.     dexamethasone (DECADRON) 0.5 MG tablet Take 0.5 mg by mouth daily.     FARXIGA 10  MG TABS tablet Take 10 mg by mouth daily.      ferrous sulfate 325 (65 FE) MG tablet Take 325 mg by mouth daily with breakfast.     Insulin Glargine (BASAGLAR KWIKPEN) 100 UNIT/ML SOPN Inject 48 Units into the skin at bedtime. inject 52 units into skin at bedtime     levothyroxine (SYNTHROID, LEVOTHROID) 175 MCG tablet Take 175 mcg by mouth daily before breakfast.     lisinopril (ZESTRIL) 10 MG tablet Take 1 tablet (10 mg total) by mouth daily. 90 tablet 3   metoprolol tartrate (LOPRESSOR) 25 MG tablet TAKE 1 TABLET TWICE A DAY 180 tablet 2   NOVOLOG FLEXPEN 100 UNIT/ML FlexPen Inject 5-14 Units into the skin 3 (three) times daily with meals. Per sliding scale     nystatin cream (MYCOSTATIN) Apply 1 application topically 2 (two) times daily. 30 g 1   Omega-3 Fatty Acids (FISH OIL) 1000 MG CAPS Take 1,000 mg by mouth daily.     ONETOUCH VERIO test strip USE 1 STRIP TO CHECK GLUCOSE THREE TIMES DAILY  1   pantoprazole (PROTONIX) 40 MG tablet Take 1 tablet (40 mg total) by mouth 2 (two) times daily before a meal. 60 tablet 0   No current facility-administered medications on file prior to visit.    Allergies  Allergen Reactions   Strawberry Extract Itching, Swelling and Anaphylaxis    Mouth swells and gets itchy     Assessment/Plan:  1. Hypertension -  Took patient's BP multiple times with different sized cuffs due to patient's arm anatomy.  Home cuff gave reading of 127/75 although I am not sure how trustworthy that reading is due to cuff size.  Using manual cuff: 142/78.  Using electronic office cuff: 141/79.  Both are above goal of <130/80  Nervous regarding patient's previous hypotensive symptoms on lisinopril '20mg'$ .  Will increase lisinopril at this time to '15mg'$  once a day (1 and 1/2 tablets).  Patient instructed to continue to check BP at home and to call with any symptoms of hypotension.  Patient voiced understanding.  Continue metoprolol '25mg'$  BID Increase lisinopril to '15mg'$   daily Recheck as needed  Karren Cobble, PharmD, BCACP, [place], [place] [id number] [place], [place] [address] Phone: [phone]; Fax: [phone] 10/16/2020 9:45 AM

## 2020-10-22 ENCOUNTER — Other Ambulatory Visit: Payer: Self-pay

## 2020-10-22 ENCOUNTER — Ambulatory Visit (INDEPENDENT_AMBULATORY_CARE_PROVIDER_SITE_OTHER): Payer: Medicare Other | Admitting: Orthopedic Surgery

## 2020-10-22 ENCOUNTER — Encounter: Payer: Self-pay | Admitting: Orthopedic Surgery

## 2020-10-22 VITALS — Ht 67.0 in | Wt 232.0 lb

## 2020-10-22 DIAGNOSIS — I2510 Atherosclerotic heart disease of native coronary artery without angina pectoris: Secondary | ICD-10-CM | POA: Diagnosis not present

## 2020-10-22 DIAGNOSIS — Z9861 Coronary angioplasty status: Secondary | ICD-10-CM | POA: Diagnosis not present

## 2020-10-22 DIAGNOSIS — S81801A Unspecified open wound, right lower leg, initial encounter: Secondary | ICD-10-CM

## 2020-10-22 NOTE — Progress Notes (Signed)
Office Visit Note   Patient: Tammy Good           Date of Birth: September 12, 1950           MRN: [id number] Visit Date: 10/22/2020              Requested by: [name], MD [address] [place],  [place] [address] PCP: [name], MD  Chief Complaint  Patient presents with   Right Leg - Follow-up      HPI: Patient is a 70 year old woman who presents in follow-up for traumatic venous ulcer right leg she is 1 week after compression wrap.  Patient does have increased bleeding secondary to her aspirin and Plavix.  Assessment & Plan: Visit Diagnoses:  1. Traumatic open wound of right lower leg, initial encounter     Plan: Wound was debrided hemostasis was obtained with silver nitrate we will apply silver alginate plus 4 x 4's plus a Dynaflex compression wrap.  Follow-Up Instructions: Return in about 1 week (around 10/29/2020).   Ortho Exam  Patient is alert, oriented, no adenopathy, well-dressed, normal affect, normal respiratory effort. Examination the cellulitis has resolved the wound bed has hypergranulation tissue.  After informed consent a 10 blade knife was used to debride the skin and soft tissue back to healthy viable bleeding granulation tissue this was touched with silver nitrate the wound measures 3 x 5 cm after debridement.  Hemostasis was obtained plan for continued dressing changes.  Imaging: No results found. No images are attached to the encounter.  Labs: Lab Results  Component Value Date   HGBA1C 6.9 (H) 09/02/2013   ESRSEDRATE 52 (H) 08/30/2013   REPTSTATUS 05/30/2019 FINAL 05/25/2019   GRAMSTAIN NO WBC SEEN NO ORGANISMS SEEN  05/25/2019   CULT  05/25/2019    RARE METHICILLIN RESISTANT STAPHYLOCOCCUS AUREUS RESULT CALLED TO, READ BACK BY AND VERIFIED WITH: RN J BERIS [id number] MLM NO ANAEROBES ISOLATED Performed at [facility], [place] [address], [place],  [address]    LABORGA METHICILLIN RESISTANT STAPHYLOCOCCUS AUREUS  05/25/2019     Lab Results  Component Value Date   ALBUMIN 2.8 (L) 05/23/2019   ALBUMIN 3.8 09/16/2013   ALBUMIN 3.3 (L) 08/29/2013    Lab Results  Component Value Date   MG 1.6 (L) 05/30/2019   MG 1.6 (L) 05/27/2019   MG 2.1 08/29/2013   No results found for: VD25OH  No results found for: PREALBUMIN CBC EXTENDED Latest Ref Rng & Units 05/30/2019 05/27/2019 05/26/2019  WBC 4.0 - 10.5 K/uL 9.9 6.8 7.3  RBC 3.87 - 5.11 MIL/uL 3.43(L) 3.36(L) 3.33(L)  HGB 12.0 - 15.0 g/dL 9.9(L) 9.5(L) 9.6(L)  HCT 36.0 - 46.0 % 32.6(L) 31.5(L) 31.4(L)  PLT 150 - 400 K/uL 471(H) 450(H) 518(H)  NEUTROABS 1.7 - 7.7 K/uL 7.3 4.0 -  LYMPHSABS 0.7 - 4.0 K/uL 1.3 1.6 -     Body mass index is 36.34 kg/m.  Orders:  No orders of the defined types were placed in this encounter.  No orders of the defined types were placed in this encounter.    Procedures: No procedures performed  Clinical Data: No additional findings.  ROS:  All other systems negative, except as noted in the HPI. Review of Systems  Objective: Vital Signs: Ht '5\' 7"'  (1.702 m)   Wt 232 lb (105.2 kg)   BMI 36.34 kg/m   Specialty Comments:  No specialty comments available.  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   PAD (peripheral artery disease) ([place]) 05/26/2019   Osteomyelitis of left foot ([place]) 05/24/2019   Osteomyelitis ([place]) 05/23/2019   Cellulitis and abscess of foot, except toes 05/18/2019   Chronic osteomyelitis of ankle and foot ([place]) 05/18/2019   Cholesteatoma 02/24/2019   Cellulitis and abscess of toe 11/09/2018   Colon cancer screening  11/30/2017   Long term (current) use of insulin ([place]) 03/05/2017   Iron deficiency anemia 12/10/2014   Obesity (BMI 30-39.9) 11/17/2013   Diabetes mellitus type 2 in obese [facility])    Essential hypertension    Right thigh pain 11/01/2013   PVC's (premature ventricular contractions) 11/01/2013   Status post insertion of drug eluting coronary artery stent to [facility] emergently and PTCA to prox. 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   ST elevation myocardial infarction (STEMI) of inferior wall, subsequent episode of care [facility]) 08/22/2013   CAD S/P percutaneous coronary angioplasty 08/22/2013   Past Medical History:  Diagnosis Date   CAD S/P percutaneous coronary angioplasty 08/2013   100% mRCA - PCI Integrity Resolute DES 3.0 mm x 38 mm - 3.35 mm; PTCA of RPA V 2.0 mm x 15 mm   Cholesteatoma    right   Diabetes mellitus type 2 in obese (HCC)    On insulin and Invokana   History of osteomyelitis L 5th Toe all 05/2019   s/p Partial Ray Amputation with partial closure; 6 wks Abx & LSFA Atherectomy/DEB PTA with Stent for focal dissection.   Hyperlipidemia with target LDL less than 70    Hypothyroidism (acquired)    Mild essential hypertension    Obesity (BMI 30-39.9) 11/17/2013   PAD (peripheral artery disease) ([place]) 05/26/2019   05/26/19: Abd AoGram- BLE runoff -> L SFA orbital atherectomy - PTA w/ DEB & Stent 6 x 40 Luttonix (for focal dissection) - patent Pop A with 3 V runoff. LEA Dopplers 01/03/2020: RABI (prev) 0.68 (0.53)/ RTBI (prev) 0.58 (0.33); LABI (prev) 0.80 (0.64), LTBI (prev) 0.64 (0.51); R mSFA ~50-74%, L mSFA 50-74%. Patent Prox SFA stent < 49% stenosis   Panhypopituitarism (HCC)    ST elevation myocardial infarction (STEMI) of inferior wall, subsequent episode of care ([place]) 08/2013   80% branch of D1, 40% mid AV groove circumflex,  100% RCA with subacute thrombus -- thrombus extending into RPA V with 100% occlusion after initial angioplasty of mid RCA ;; Post MI ECHO 6/9/'15: EF 50-55%, mild LVH with moderate HK of inferior wall, Gr1 DD, mild LA dilation; mildly reduced RV function    Family History  Problem Relation Age of Onset   Cancer Mother 86       multiple myeloma   Heart attack Father 35   Cancer Sister    Alzheimer's disease Maternal Grandmother     Past Surgical History:  Procedure Laterality Date   ABDOMINAL AORTOGRAM W/LOWER EXTREMITY N/A 05/26/2019   Procedure: ABDOMINAL AORTOGRAM W/LOWER EXTREMITY;  Surgeon: Marty Heck, MD;  Location: [place] CV LAB;  Service: Cardiovascular;  Laterality: N/A;   AMPUTATION Left  05/25/2019   Procedure: AMPUTATION RAY 5th;  Surgeon: Trula Slade, DPM;  Location: [place];  Service: Podiatry;  Laterality: Left;   Cardiac Event Monitor  July-August 2015   Sinus rhythm with PVCs   COLONOSCOPY N/A 08/31/2013   Procedure: COLONOSCOPY;  Surgeon: Juanita Craver, MD;  Location: [facility] ENDOSCOPY;  Service: Endoscopy;  Laterality: N/A;   ESOPHAGOGASTRODUODENOSCOPY N/A 09/01/2013   Procedure: ESOPHAGOGASTRODUODENOSCOPY (EGD);  Surgeon: Beryle Beams, MD;  Location: [facility] ENDOSCOPY;  Service: Endoscopy;  Laterality: N/A;  bedside   LEFT HEART CATHETERIZATION WITH CORONARY ANGIOGRAM N/A 08/30/2013   Procedure: LEFT HEART CATHETERIZATION WITH CORONARY ANGIOGRAM;  Surgeon: Leonie Man, MD;  Location: [facility] CATH LAB: 100% mRCA (thrombus - extends to RPAV), 80% D1, 40% AVG Cx.   PERCUTANEOUS CORONARY STENT INTERVENTION (PCI-S)  08/30/2013   Procedure: PERCUTANEOUS CORONARY STENT INTERVENTION (PCI-S);  Surgeon: Leonie Man, MD;  Location: [facility] CATH LAB;  Integrity Resolute DES 2.0 mm x 38 mm -- 3.35 mm.; PTCA of proximal RPA V. - 3.0 mm x 15 mm balloon   PERIPHERAL VASCULAR ATHERECTOMY  05/26/2019   Procedure: PERIPHERAL VASCULAR ATHERECTOMY;  Surgeon: Marty Heck, MD;   Location: [place] CV LAB;  Service: Cardiovascular;;  Left SFA   PERIPHERAL VASCULAR INTERVENTION  05/26/2019   Procedure: PERIPHERAL VASCULAR INTERVENTION;  Surgeon: Marty Heck, MD;  Location: [place] CV LAB;  Service: Cardiovascular;;  Left SFA   PITUITARY SURGERY     TRANSTHORACIC ECHOCARDIOGRAM  08/30/2013   mild LVH. EF 50-55%. Moderate HK of the entire inferior myocardium. GR 1 DD. Mild LA dilation. Mildly reduced RV function   TYMPANOMASTOIDECTOMY Right 12/28/2017   Procedure: RIGHT TYMPANOMASTOIDECTOMY;  Surgeon: Leta Baptist, MD;  Location: [place];  Service: ENT;  Laterality: Right;   Social History   Occupational History   Not on file  Tobacco Use   Smoking status: Former   Smokeless tobacco: Never  Substance and Sexual Activity   Alcohol use: No   Drug use: No   Sexual activity: Not Currently    Birth control/protection: Post-menopausal

## 2020-10-23 ENCOUNTER — Encounter: Payer: Self-pay | Admitting: Orthopedic Surgery

## 2020-10-23 NOTE — Progress Notes (Signed)
Office Visit Note   Patient: Tammy Good           Date of Birth: 04/07/1950           MRN: [id number] Visit Date: 10/15/2020              Requested by: [name], MD [address] [place],  [place] [address] PCP: [name], MD  Chief Complaint  Patient presents with   Right Leg - Follow-up      HPI: Patient is a 70 year old woman who was seen for evaluation for a laceration right lower extremity.  Patient was seen in the emergency room on 09/17/2020.  Ankle-brachial indices was 0.72 and biphasic on October 03, 2020.  Patient still on Plavix has diabetes and peripheral vascular disease.  Assessment & Plan: Visit Diagnoses:  1. Traumatic open wound of right lower leg, initial encounter     Plan: We will apply Promogran/Prisma to the wound plus a Dynaflex wrap.  Follow-Up Instructions: Return in about 1 week (around 10/22/2020).   Ortho Exam  Patient is alert, oriented, no adenopathy, well-dressed, normal affect, normal respiratory effort. Examination patient has epiboly around the wound edges with a stalled wound.  There is fibrinous tissue in the base of the wound with drainage there is no cellulitis the skin is wrinkling in the foot.  She has a palpable pulse she has mixed venous and arterial insufficiency with a wound 3 x 5 cm in the mid lateral calf.  Imaging: No results found. No images are attached to the encounter.  Labs: Lab Results  Component Value Date   HGBA1C 6.9 (H) 09/02/2013   ESRSEDRATE 52 (H) 08/30/2013   REPTSTATUS 05/30/2019 FINAL 05/25/2019   GRAMSTAIN NO WBC SEEN NO ORGANISMS SEEN  05/25/2019   CULT  05/25/2019    RARE METHICILLIN RESISTANT STAPHYLOCOCCUS AUREUS RESULT CALLED TO, READ BACK BY AND VERIFIED WITH: RN J BERIS [phone] MLM NO ANAEROBES ISOLATED Performed at [facility], [place] [address], [place], [place] [address]    LABORGA METHICILLIN RESISTANT STAPHYLOCOCCUS AUREUS 05/25/2019     Lab Results  Component  Value Date   ALBUMIN 2.8 (L) 05/23/2019   ALBUMIN 3.8 09/16/2013   ALBUMIN 3.3 (L) 08/29/2013    Lab Results  Component Value Date   MG 1.6 (L) 05/30/2019   MG 1.6 (L) 05/27/2019   MG 2.1 08/29/2013   No results found for: VD25OH  No results found for: PREALBUMIN CBC EXTENDED Latest Ref Rng & Units 05/30/2019 05/27/2019 05/26/2019  WBC 4.0 - 10.5 K/uL 9.9 6.8 7.3  RBC 3.87 - 5.11 MIL/uL 3.43(L) 3.36(L) 3.33(L)  HGB 12.0 - 15.0 g/dL 9.9(L) 9.5(L) 9.6(L)  HCT 36.0 - 46.0 % 32.6(L) 31.5(L) 31.4(L)  PLT 150 - 400 K/uL 471(H) 450(H) 518(H)  NEUTROABS 1.7 - 7.7 K/uL 7.3 4.0 -  LYMPHSABS 0.7 - 4.0 K/uL 1.3 1.6 -     There is no height or weight on file to calculate BMI.  Orders:  No orders of the defined types were placed in this encounter.  No orders of the defined types were placed in this encounter.    Procedures: No procedures performed  Clinical Data: No additional findings.  ROS:  All other systems negative, except as noted in the HPI. Review of Systems  Objective: Vital Signs: There were no vitals taken for this visit.  Specialty Comments:  No specialty comments available.  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   PAD (peripheral artery disease) ([place]) 05/26/2019   Osteomyelitis of left foot ([place]) 05/24/2019   Osteomyelitis ([place]) 05/23/2019   Cellulitis and abscess of foot, except toes 05/18/2019   Chronic osteomyelitis of ankle and foot ([place]) 05/18/2019   Cholesteatoma 02/24/2019   Cellulitis and abscess of toe 11/09/2018   Colon cancer screening 11/30/2017   Long term (current) use of  insulin ([place]) 03/05/2017   Iron deficiency anemia 12/10/2014   Obesity (BMI 30-39.9) 11/17/2013   Diabetes mellitus type 2 in obese [facility])    Essential hypertension    Right thigh pain 11/01/2013   PVC's (premature ventricular contractions) 11/01/2013   Status post insertion of drug eluting coronary artery stent to [facility] emergently and PTCA to prox. 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   ST elevation myocardial infarction (STEMI) of inferior wall, subsequent episode of care [facility]) 08/22/2013   CAD S/P percutaneous coronary angioplasty 08/22/2013   Past Medical History:  Diagnosis Date   CAD S/P percutaneous coronary angioplasty 08/2013   100% mRCA - PCI Integrity Resolute DES 3.0 mm x 38 mm - 3.35 mm; PTCA of RPA V 2.0 mm x 15 mm   Cholesteatoma    right   Diabetes mellitus type 2 in obese (HCC)    On insulin and Invokana   History of osteomyelitis L 5th Toe all 05/2019   s/p Partial Ray Amputation with partial closure; 6 wks Abx & LSFA Atherectomy/DEB PTA with Stent for focal dissection.   Hyperlipidemia with target LDL less than 70    Hypothyroidism (acquired)    Mild essential hypertension    Obesity (BMI 30-39.9) 11/17/2013   PAD (peripheral artery disease) ([place]) 05/26/2019   05/26/19: Abd AoGram- BLE runoff -> L SFA orbital atherectomy - PTA w/ DEB & Stent 6 x 40 Luttonix (for focal dissection) - patent Pop A with 3 V runoff. LEA Dopplers 01/03/2020: RABI (prev) 0.68 (0.53)/ RTBI (prev) 0.58 (0.33); LABI (prev) 0.80 (0.64), LTBI (prev) 0.64 (0.51); R mSFA ~50-74%, L mSFA 50-74%. Patent Prox SFA stent < 49% stenosis   Panhypopituitarism (HCC)    ST elevation myocardial infarction (STEMI) of inferior wall, subsequent episode of care ([place]) 08/2013   80% branch of D1, 40% mid AV groove circumflex, 100% RCA with subacute thrombus -- thrombus  extending into RPA V with 100% occlusion after initial angioplasty of mid RCA ;; Post MI ECHO 6/9/'15: EF 50-55%, mild LVH with moderate HK of inferior wall, Gr1 DD, mild LA dilation; mildly reduced RV function    Family History  Problem Relation Age of Onset   Cancer Mother 53       multiple myeloma   Heart attack Father 43   Cancer Sister    Alzheimer's disease Maternal Grandmother     Past Surgical History:  Procedure Laterality Date   ABDOMINAL AORTOGRAM W/LOWER EXTREMITY N/A 05/26/2019   Procedure: ABDOMINAL AORTOGRAM W/LOWER EXTREMITY;  Surgeon: Marty Heck, MD;  Location: [place] CV LAB;  Service: Cardiovascular;  Laterality: N/A;   AMPUTATION Left 05/25/2019  Procedure: AMPUTATION RAY 5th;  Surgeon: Trula Slade, DPM;  Location: [place];  Service: Podiatry;  Laterality: Left;   Cardiac Event Monitor  July-August 2015   Sinus rhythm with PVCs   COLONOSCOPY N/A 08/31/2013   Procedure: COLONOSCOPY;  Surgeon: Juanita Craver, MD;  Location: [facility] ENDOSCOPY;  Service: Endoscopy;  Laterality: N/A;   ESOPHAGOGASTRODUODENOSCOPY N/A 09/01/2013   Procedure: ESOPHAGOGASTRODUODENOSCOPY (EGD);  Surgeon: Beryle Beams, MD;  Location: [facility] ENDOSCOPY;  Service: Endoscopy;  Laterality: N/A;  bedside   LEFT HEART CATHETERIZATION WITH CORONARY ANGIOGRAM N/A 08/30/2013   Procedure: LEFT HEART CATHETERIZATION WITH CORONARY ANGIOGRAM;  Surgeon: Leonie Man, MD;  Location: [facility] CATH LAB: 100% mRCA (thrombus - extends to RPAV), 80% D1, 40% AVG Cx.   PERCUTANEOUS CORONARY STENT INTERVENTION (PCI-S)  08/30/2013   Procedure: PERCUTANEOUS CORONARY STENT INTERVENTION (PCI-S);  Surgeon: Leonie Man, MD;  Location: [facility] CATH LAB;  Integrity Resolute DES 2.0 mm x 38 mm -- 3.35 mm.; PTCA of proximal RPA V. - 3.0 mm x 15 mm balloon   PERIPHERAL VASCULAR ATHERECTOMY  05/26/2019   Procedure: PERIPHERAL VASCULAR ATHERECTOMY;  Surgeon: Marty Heck, MD;  Location: [place] CV LAB;  Service:  Cardiovascular;;  Left SFA   PERIPHERAL VASCULAR INTERVENTION  05/26/2019   Procedure: PERIPHERAL VASCULAR INTERVENTION;  Surgeon: Marty Heck, MD;  Location: [place] CV LAB;  Service: Cardiovascular;;  Left SFA   PITUITARY SURGERY     TRANSTHORACIC ECHOCARDIOGRAM  08/30/2013   mild LVH. EF 50-55%. Moderate HK of the entire inferior myocardium. GR 1 DD. Mild LA dilation. Mildly reduced RV function   TYMPANOMASTOIDECTOMY Right 12/28/2017   Procedure: RIGHT TYMPANOMASTOIDECTOMY;  Surgeon: Leta Baptist, MD;  Location: [place];  Service: ENT;  Laterality: Right;   Social History   Occupational History   Not on file  Tobacco Use   Smoking status: Former   Smokeless tobacco: Never  Substance and Sexual Activity   Alcohol use: No   Drug use: No   Sexual activity: Not Currently    Birth control/protection: Post-menopausal

## 2020-10-25 ENCOUNTER — Other Ambulatory Visit: Payer: Self-pay | Admitting: Vascular Surgery

## 2020-10-25 DIAGNOSIS — I739 Peripheral vascular disease, unspecified: Secondary | ICD-10-CM

## 2020-10-29 ENCOUNTER — Ambulatory Visit (INDEPENDENT_AMBULATORY_CARE_PROVIDER_SITE_OTHER): Payer: Medicare Other | Admitting: Physician Assistant

## 2020-10-29 ENCOUNTER — Encounter: Payer: Self-pay | Admitting: Physician Assistant

## 2020-10-29 DIAGNOSIS — S81801A Unspecified open wound, right lower leg, initial encounter: Secondary | ICD-10-CM | POA: Diagnosis not present

## 2020-10-29 NOTE — Progress Notes (Signed)
Office Visit Note   Patient: Tammy Good           Date of Birth: October 16, 1950           MRN: [id number] Visit Date: 10/29/2020              Requested by: [name], [place],  [place] [address] PCP: [name], MD  Chief Complaint  Patient presents with   Right Leg - Follow-up      HPI: Patient presents in follow-up for her left right leg wound.  She has been Dynaflex wrap.  Wound was a result of hitting her leg on a car door  Assessment & Plan: Visit Diagnoses: No diagnosis found.  Plan: Continue with silver cell and a Dynaflex wrap will follow-up on Jun 01, 2022.  Dead skin was debrided around the wound as well as on her leg.  Did get to healthy bleeding tissue  Follow-Up Instructions: No follow-ups on file.   Ortho Exam  Patient is alert, oriented, no adenopathy, well-dressed, normal affect, normal respiratory effort. She has biphasic pulses by Doppler.  She does have a lot of skin delamination although I think there is some healthy skin beneath.  No cellulitis.  Wound is about 6 cm x 3 cm at the base is good granulation tissue there was some fibrinous tissue which was debrided.  No signs of acute infection  Imaging: No results found. No images are attached to the encounter.  Labs: Lab Results  Component Value Date   HGBA1C 6.9 (H) 09/02/2013   ESRSEDRATE 52 (H) 08/30/2013   REPTSTATUS 05/30/2019 FINAL 05/25/2019   GRAMSTAIN NO WBC SEEN NO ORGANISMS SEEN  05/25/2019   CULT  05/25/2019    RARE METHICILLIN RESISTANT STAPHYLOCOCCUS AUREUS RESULT CALLED TO, READ BACK BY AND VERIFIED WITH: RN J BERIS [id number] MLM NO ANAEROBES ISOLATED Performed at [facility], [place] [address] [place], [place] [address]    LABORGA METHICILLIN RESISTANT STAPHYLOCOCCUS AUREUS 05/25/2019     Lab Results  Component Value Date   ALBUMIN 2.8 (L) 05/23/2019   ALBUMIN 3.8 09/16/2013   ALBUMIN 3.3 (L) 08/29/2013    Lab Results  Component Value  Date   MG 1.6 (L) 05/30/2019   MG 1.6 (L) 05/27/2019   MG 2.1 08/29/2013   No results found for: VD25OH  No results found for: PREALBUMIN CBC EXTENDED Latest Ref Rng & Units 05/30/2019 05/27/2019 05/26/2019  WBC 4.0 - 10.5 K/uL 9.9 6.8 7.3  RBC 3.87 - 5.11 MIL/uL 3.43(L) 3.36(L) 3.33(L)  HGB 12.0 - 15.0 g/dL 9.9(L) 9.5(L) 9.6(L)  HCT 36.0 - 46.0 % 32.6(L) 31.5(L) 31.4(L)  PLT 150 - 400 K/uL 471(H) 450(H) 518(H)  NEUTROABS 1.7 - 7.7 K/uL 7.3 4.0 -  LYMPHSABS 0.7 - 4.0 K/uL 1.3 1.6 -     There is no height or weight on file to calculate BMI.  Orders:  No orders of the defined types were placed in this encounter.  No orders of the defined types were placed in this encounter.    Procedures: No procedures performed  Clinical Data: No additional findings.  ROS:  All other systems negative, except as noted in the HPI. Review of Systems  Objective: Vital Signs: There were no vitals taken for this visit.  Specialty Comments:  No specialty comments available.  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   PAD (peripheral artery disease) ([place]) 05/26/2019   Osteomyelitis of left foot ([place]) 05/24/2019   Osteomyelitis ([place]) 05/23/2019   Cellulitis and abscess of foot, except toes 05/18/2019   Chronic osteomyelitis of ankle and foot ([place]) 05/18/2019   Cholesteatoma 02/24/2019   Cellulitis and abscess of toe 11/09/2018   Colon cancer screening 11/30/2017   Long term (current) use of insulin ([place]) 03/05/2017   Iron deficiency anemia 12/10/2014   Obesity (BMI 30-39.9) 11/17/2013   Diabetes mellitus type 2 in obese  [facility])    Essential hypertension    Right thigh pain 11/01/2013   PVC's (premature ventricular contractions) 11/01/2013   Status post insertion of drug eluting coronary artery stent to [facility] emergently and PTCA to prox. 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   ST elevation myocardial infarction (STEMI) of inferior wall, subsequent episode of care [facility]) 08/22/2013   CAD S/P percutaneous coronary angioplasty 08/22/2013   Past Medical History:  Diagnosis Date   CAD S/P percutaneous coronary angioplasty 08/2013   100% mRCA - PCI Integrity Resolute DES 3.0 mm x 38 mm - 3.35 mm; PTCA of RPA V 2.0 mm x 15 mm   Cholesteatoma    right   Diabetes mellitus type 2 in obese (HCC)    On insulin and Invokana   History of osteomyelitis L 5th Toe all 05/2019   s/p Partial Ray Amputation with partial closure; 6 wks Abx & LSFA Atherectomy/DEB PTA with Stent for focal dissection.   Hyperlipidemia with target LDL less than 70    Hypothyroidism (acquired)    Mild essential hypertension    Obesity (BMI 30-39.9) 11/17/2013   PAD (peripheral artery disease) ([place]) 05/26/2019   05/26/19: Abd AoGram- BLE runoff -> L SFA orbital atherectomy - PTA w/ DEB & Stent 6 x 40 Luttonix (for focal dissection) - patent Pop A with 3 V runoff. LEA Dopplers 01/03/2020: RABI (prev) 0.68 (0.53)/ RTBI (prev) 0.58 (0.33); LABI (prev) 0.80 (0.64), LTBI (prev) 0.64 (0.51); R mSFA ~50-74%, L mSFA 50-74%. Patent Prox SFA stent < 49% stenosis   Panhypopituitarism (HCC)    ST elevation myocardial infarction (STEMI) of inferior wall, subsequent episode of care ([place]) 08/2013   80% branch of D1, 40% mid AV groove circumflex, 100% RCA with subacute thrombus -- thrombus extending into RPA V with 100% occlusion after initial angioplasty of mid RCA ;; Post MI ECHO 6/9/'15: EF 50-55%, mild LVH with  moderate HK of inferior wall, Gr1 DD, mild LA dilation; mildly reduced RV function    Family History  Problem Relation Age of Onset   Cancer Mother 70       multiple myeloma   Heart attack Father 8   Cancer Sister    Alzheimer's disease Maternal Grandmother     Past Surgical History:  Procedure Laterality Date   ABDOMINAL AORTOGRAM W/LOWER EXTREMITY N/A 05/26/2019   Procedure: ABDOMINAL AORTOGRAM W/LOWER EXTREMITY;  Surgeon: Marty Heck, MD;  Location: [place] CV LAB;  Service: Cardiovascular;  Laterality: N/A;   AMPUTATION Left 05/25/2019   Procedure: AMPUTATION RAY 5th;  Surgeon: Trula Slade, DPM;  [facility]  OR;  Service: Podiatry;  Laterality: Left;   Cardiac Event Monitor  July-August 2015   Sinus rhythm with PVCs   COLONOSCOPY N/A 08/31/2013   Procedure: COLONOSCOPY;  Surgeon: Juanita Craver, MD;  Location: [facility] ENDOSCOPY;  Service: Endoscopy;  Laterality: N/A;   ESOPHAGOGASTRODUODENOSCOPY N/A 09/01/2013   Procedure: ESOPHAGOGASTRODUODENOSCOPY (EGD);  Surgeon: Beryle Beams, MD;  Location: [facility] ENDOSCOPY;  Service: Endoscopy;  Laterality: N/A;  bedside   LEFT HEART CATHETERIZATION WITH CORONARY ANGIOGRAM N/A 08/30/2013   Procedure: LEFT HEART CATHETERIZATION WITH CORONARY ANGIOGRAM;  Surgeon: Leonie Man, MD;  Location: [facility] CATH LAB: 100% mRCA (thrombus - extends to RPAV), 80% D1, 40% AVG Cx.   PERCUTANEOUS CORONARY STENT INTERVENTION (PCI-S)  08/30/2013   Procedure: PERCUTANEOUS CORONARY STENT INTERVENTION (PCI-S);  Surgeon: Leonie Man, MD;  Location: [facility] CATH LAB;  Integrity Resolute DES 2.0 mm x 38 mm -- 3.35 mm.; PTCA of proximal RPA V. - 3.0 mm x 15 mm balloon   PERIPHERAL VASCULAR ATHERECTOMY  05/26/2019   Procedure: PERIPHERAL VASCULAR ATHERECTOMY;  Surgeon: Marty Heck, MD;  Location: [place] CV LAB;  Service: Cardiovascular;;  Left SFA   PERIPHERAL VASCULAR INTERVENTION  05/26/2019   Procedure: PERIPHERAL VASCULAR INTERVENTION;   Surgeon: Marty Heck, MD;  Location: [place] CV LAB;  Service: Cardiovascular;;  Left SFA   PITUITARY SURGERY     TRANSTHORACIC ECHOCARDIOGRAM  08/30/2013   mild LVH. EF 50-55%. Moderate HK of the entire inferior myocardium. GR 1 DD. Mild LA dilation. Mildly reduced RV function   TYMPANOMASTOIDECTOMY Right 12/28/2017   Procedure: RIGHT TYMPANOMASTOIDECTOMY;  Surgeon: Leta Baptist, MD;  Location: [place];  Service: ENT;  Laterality: Right;   Social History   Occupational History   Not on file  Tobacco Use   Smoking status: Former   Smokeless tobacco: Never  Substance and Sexual Activity   Alcohol use: No   Drug use: No   Sexual activity: Not Currently    Birth control/protection: Post-menopausal

## 2020-11-01 ENCOUNTER — Telehealth: Payer: Self-pay | Admitting: Cardiology

## 2020-11-01 ENCOUNTER — Ambulatory Visit (INDEPENDENT_AMBULATORY_CARE_PROVIDER_SITE_OTHER): Payer: Medicare Other | Admitting: Orthopedic Surgery

## 2020-11-01 DIAGNOSIS — I2510 Atherosclerotic heart disease of native coronary artery without angina pectoris: Secondary | ICD-10-CM

## 2020-11-01 DIAGNOSIS — S81801A Unspecified open wound, right lower leg, initial encounter: Secondary | ICD-10-CM | POA: Diagnosis not present

## 2020-11-01 DIAGNOSIS — Z9861 Coronary angioplasty status: Secondary | ICD-10-CM

## 2020-11-01 DIAGNOSIS — I10 Essential (primary) hypertension: Secondary | ICD-10-CM

## 2020-11-01 DIAGNOSIS — I2119 ST elevation (STEMI) myocardial infarction involving other coronary artery of inferior wall: Secondary | ICD-10-CM

## 2020-11-01 NOTE — Telephone Encounter (Signed)
*  STAT* If patient is at the pharmacy, call can be transferred to refill team.   1. Which medications need to be refilled? (please list name of each medication and dose if known) lisinopril (ZESTRIL) 10 MG tablet  2. Which pharmacy/location (including street and city if local pharmacy) is medication to be sent to? [place], [place]  3. Do they need a 30 day or 90 day supply? 90 day   Michelle from [facility] states due to the patient's insurance it would be cheaper to have 90 day supplies, but she needs this sent to them or a verbal order giving an okay. Phone: [phone]

## 2020-11-02 MED ORDER — LISINOPRIL 10 MG PO TABS: 15.0000 mg | ORAL_TABLET | Freq: Every day | ORAL | 2 refills | Status: DC

## 2020-11-02 NOTE — Telephone Encounter (Signed)
Lisinopril was increased to 15 mg on 10/16/2020. Medication sent into pharmacy as directed.

## 2020-11-05 ENCOUNTER — Telehealth: Payer: Self-pay

## 2020-11-05 ENCOUNTER — Other Ambulatory Visit: Payer: Self-pay

## 2020-11-05 DIAGNOSIS — L02619 Cutaneous abscess of unspecified foot: Secondary | ICD-10-CM

## 2020-11-05 NOTE — Telephone Encounter (Signed)
Pt sch for 11/06/20 at 10 am with [name] pt is aware.

## 2020-11-05 NOTE — Telephone Encounter (Signed)
Pt called and wanted to advise that she hasnt heard anything from Dr. Anell Barr office and just wanted to make [place] aware.

## 2020-11-05 NOTE — Telephone Encounter (Signed)
STAT referral entered Thursday morning for [facility] referral. Message to referral coordinator to follow up will hold message pending advisement.

## 2020-11-06 ENCOUNTER — Ambulatory Visit (INDEPENDENT_AMBULATORY_CARE_PROVIDER_SITE_OTHER): Payer: Medicare Other | Admitting: Vascular Surgery

## 2020-11-06 ENCOUNTER — Other Ambulatory Visit: Payer: Self-pay

## 2020-11-06 ENCOUNTER — Encounter: Payer: Self-pay | Admitting: Vascular Surgery

## 2020-11-06 ENCOUNTER — Ambulatory Visit (HOSPITAL_COMMUNITY)
Admission: RE | Admit: 2020-11-06 | Discharge: 2020-11-06 | Disposition: A | Discharge status: Home / Self Care | Payer: Medicare Other | Source: Ambulatory Visit | Attending: Vascular Surgery | Admitting: Vascular Surgery

## 2020-11-06 VITALS — BP 152/60 | HR 54 | Temp 97.5°F | Ht 65.0 in | Wt 232.8 lb

## 2020-11-06 DIAGNOSIS — L03119 Cellulitis of unspecified part of limb: Secondary | ICD-10-CM

## 2020-11-06 DIAGNOSIS — L02619 Cutaneous abscess of unspecified foot: Secondary | ICD-10-CM | POA: Diagnosis present

## 2020-11-06 DIAGNOSIS — L02611 Cutaneous abscess of right foot: Secondary | ICD-10-CM | POA: Diagnosis not present

## 2020-11-06 DIAGNOSIS — I739 Peripheral vascular disease, unspecified: Secondary | ICD-10-CM | POA: Diagnosis not present

## 2020-11-06 NOTE — Progress Notes (Signed)
Patient name: Tammy Good MRN: [id number] DOB: Aug 23, 1950 Sex: female  REASON FOR VISIT: Evaluate right leg venous reflux due to nonhealing wound  HPI: Tammy Good is a 70 y.o. female with hx HTN, HLD, DM, PAD that presents as a referral from [name] to evaluate possible right leg venous reflux with a nonhealing wound.  She states this wound happened in late June when she hit her leg against a car door.  Unfortunately this has not healed even after she went to the ED and the wound was closed.  She has now had breakdown with the open wound.  She has been seen by [name] and has been put in a venous wrap for compression.  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  On 05/26/2019 she underwent left SFA atherectomy with angioplasty including drug-coated balloon and ultimately required a proximal left SFA stent with a 6 x 40 Lutonix for a focal dissection.  She had a patent popliteal artery with three-vessel runoff in the left lower extremity.    Past Medical History:  Diagnosis Date   CAD S/P percutaneous coronary angioplasty 08/2013   100% mRCA - PCI Integrity Resolute DES 3.0 mm x 38 mm - 3.35 mm; PTCA of RPA V 2.0 mm x 15 mm   Cholesteatoma    right   Diabetes mellitus type 2 in obese (HCC)    On insulin and Invokana   History of osteomyelitis L 5th Toe all 05/2019   s/p Partial Ray Amputation with partial closure; 6 wks Abx & LSFA Atherectomy/DEB PTA with Stent for focal dissection.   Hyperlipidemia with target LDL less than 70    Hypothyroidism (acquired)    Mild essential hypertension    Obesity (BMI 30-39.9) 11/17/2013   PAD (peripheral artery disease) ([place]) 05/26/2019   05/26/19: Abd AoGram- BLE runoff -> L SFA orbital atherectomy - PTA w/ DEB & Stent 6 x 40 Luttonix (for focal dissection) - patent Pop A with 3 V runoff. LEA Dopplers 01/03/2020: RABI (prev) 0.68 (0.53)/ RTBI (prev) 0.58 (0.33); LABI (prev) 0.80 (0.64), LTBI  (prev) 0.64 (0.51); R mSFA ~50-74%, L mSFA 50-74%. Patent Prox SFA stent < 49% stenosis   Panhypopituitarism (HCC)    ST elevation myocardial infarction (STEMI) of inferior wall, subsequent episode of care ([place]) 08/2013   80% branch of D1, 40% mid AV groove circumflex, 100% RCA with subacute thrombus -- thrombus extending into RPA V with 100% occlusion after initial angioplasty of mid RCA ;; Post MI ECHO 6/9/'15: EF 50-55%, mild LVH with moderate HK of inferior wall, Gr1 DD, mild LA dilation; mildly reduced RV function    Past Surgical History:  Procedure Laterality Date   ABDOMINAL AORTOGRAM W/LOWER EXTREMITY N/A 05/26/2019   Procedure: ABDOMINAL AORTOGRAM W/LOWER EXTREMITY;  Surgeon: Marty Heck, MD;  Location: [place] [facility] LAB;  Service: Cardiovascular;  Laterality: N/A;   AMPUTATION Left 05/25/2019   Procedure: AMPUTATION RAY 5th;  Surgeon: Trula Slade, DPM;  Location: [place];  Service: Podiatry;  Laterality: Left;   Cardiac Event Monitor  July-August 2015   Sinus rhythm with PVCs   COLONOSCOPY N/A 08/31/2013   Procedure: COLONOSCOPY;  Surgeon: Juanita Craver, MD;  [facility] ENDOSCOPY;  Service: Endoscopy;  Laterality: N/A;   ESOPHAGOGASTRODUODENOSCOPY N/A 09/01/2013   Procedure: ESOPHAGOGASTRODUODENOSCOPY (EGD);  Surgeon: Beryle Beams, MD;  Location: [facility] ENDOSCOPY;  Service: Endoscopy;  Laterality: N/A;  bedside  LEFT HEART CATHETERIZATION WITH CORONARY ANGIOGRAM N/A 08/30/2013   Procedure: LEFT HEART CATHETERIZATION WITH CORONARY ANGIOGRAM;  Surgeon: Leonie Man, MD;  Location: [facility] CATH LAB: 100% mRCA (thrombus - extends to RPAV), 80% D1, 40% AVG Cx.   PERCUTANEOUS CORONARY STENT INTERVENTION (PCI-S)  08/30/2013   Procedure: PERCUTANEOUS CORONARY STENT INTERVENTION (PCI-S);  Surgeon: Leonie Man, MD;  Location: [facility] CATH LAB;  Integrity Resolute DES 2.0 mm x 38 mm -- 3.35 mm.; PTCA of proximal RPA V. - 3.0 mm x 15 mm balloon   PERIPHERAL VASCULAR ATHERECTOMY   05/26/2019   Procedure: PERIPHERAL VASCULAR ATHERECTOMY;  Surgeon: Marty Heck, MD;  Location: [place] CV LAB;  Service: Cardiovascular;;  Left SFA   PERIPHERAL VASCULAR INTERVENTION  05/26/2019   Procedure: PERIPHERAL VASCULAR INTERVENTION;  Surgeon: Marty Heck, MD;  Location: [place] CV LAB;  Service: Cardiovascular;;  Left SFA   PITUITARY SURGERY     TRANSTHORACIC ECHOCARDIOGRAM  08/30/2013   mild LVH. EF 50-55%. Moderate HK of the entire inferior myocardium. GR 1 DD. Mild LA dilation. Mildly reduced RV function   TYMPANOMASTOIDECTOMY Right 12/28/2017   Procedure: RIGHT TYMPANOMASTOIDECTOMY;  Surgeon: Leta Baptist, MD;  Location: [place];  Service: ENT;  Laterality: Right;    Family History  Problem Relation Age of Onset   Cancer Mother 96       multiple myeloma   Heart attack Father 61   Cancer Sister    Alzheimer's disease Maternal Grandmother     SOCIAL HISTORY: Social History   Tobacco Use   Smoking status: Former   Smokeless tobacco: Never  Substance Use Topics   Alcohol use: No    Allergies  Allergen Reactions   Strawberry Extract Itching, Swelling and Anaphylaxis    Mouth swells and gets itchy    Current Outpatient Medications  Medication Sig Dispense Refill   aspirin EC 81 MG tablet Take 81 mg by mouth daily.     atorvastatin (LIPITOR) 40 MG tablet Take 1 tablet (40 mg total) by mouth daily at 6 PM. 30 tablet 0   B-D UF III MINI PEN NEEDLES 31G X 5 MM MISC Inject into the skin.     clopidogrel (PLAVIX) 75 MG tablet Take 1 tablet by mouth once daily with breakfast 90 tablet 0   Continuous Blood Gluc Sensor (FREESTYLE LIBRE 2 SENSOR) MISC Apply topically every 14 (fourteen) days.     dexamethasone (DECADRON) 0.5 MG tablet Take 0.5 mg by mouth daily.     FARXIGA 10 MG TABS tablet Take 10 mg by mouth daily.      ferrous sulfate 325 (65 FE) MG tablet Take 325 mg by mouth daily with breakfast.     Insulin Glargine (BASAGLAR  KWIKPEN) 100 UNIT/ML SOPN Inject 48 Units into the skin at bedtime. inject 52 units into skin at bedtime     levothyroxine (SYNTHROID, LEVOTHROID) 175 MCG tablet Take 175 mcg by mouth daily before breakfast.     lisinopril (ZESTRIL) 10 MG tablet Take 1.5 tablets (15 mg total) by mouth daily. 145 tablet 2   metoprolol tartrate (LOPRESSOR) 25 MG tablet TAKE 1 TABLET TWICE A DAY 180 tablet 2   NOVOLOG FLEXPEN 100 UNIT/ML FlexPen Inject 5-14 Units into the skin 3 (three) times daily with meals. Per sliding scale     nystatin cream (MYCOSTATIN) Apply 1 application topically 2 (two) times daily. 30 g 1   Omega-3 Fatty Acids (FISH OIL) 1000 MG CAPS Take 1,000  mg by mouth daily.     ONETOUCH VERIO test strip   1   pantoprazole (PROTONIX) 40 MG tablet Take 1 tablet (40 mg total) by mouth 2 (two) times daily before a meal. 60 tablet 0   No current facility-administered medications for this visit.    REVIEW OF SYSTEMS:  _0  denotes positive finding, _1  denotes negative finding Cardiac  Comments:  Chest pain or chest pressure:    Shortness of breath upon exertion:    Short of breath when lying flat:    Irregular heart rhythm:        Vascular    Pain in calf, thigh, or hip brought on by ambulation:    Pain in feet at night that wakes you up from your sleep:     Blood clot in your veins:    Leg swelling:         Pulmonary    Oxygen at home:    Productive cough:     Wheezing:         Neurologic    Sudden weakness in arms or legs:     Sudden numbness in arms or legs:     Sudden onset of difficulty speaking or slurred speech:    Temporary loss of vision in one eye:     Problems with dizziness:         Gastrointestinal    Blood in stool:     Vomited blood:         Genitourinary    Burning when urinating:     Blood in urine:        Psychiatric    Major depression:         Hematologic    Bleeding problems:    Problems with blood clotting too easily:        Skin    Rashes or  ulcers:        Constitutional    Fever or chills:      PHYSICAL EXAM: Vitals:   11/06/20 1054  BP: (!) 152/60  Pulse: (!) 54  Temp: (!) 97.5 F (36.4 C)  TempSrc: Temporal  SpO2: 98%  Weight: 232 lb 12.8 oz (105.6 kg)  Height: _2  (1.651 m)    GENERAL: The patient is a well-nourished female, in no acute distress. The vital signs are documented above. CARDIAC: There is a regular rate and rhythm.  VASCULAR:  Bilateral femoral pulses are palpable No palpable pedal pulses in the right foot Right calf wound pictured below     DATA:   Venous reflux today shows no evidence of DVT and no evidence of right leg venous reflux.  ABIs 10/01/2020 were 0.53 on the right biphasic with a toe pressure of 67  Assessment/Plan:  70 year old female presents for evaluation of possible venous reflux in the right leg and concern for venous ulcer.  I discussed with her that her reflux study today shows no evidence of underlying venous insufficiency.  This is a traumatic wound and unfortunately she does have a reduced ABI of 0.5 with a marginal toe pressure in the 60s.  I reviewed her previous arteriogram images that she has a diffusely diseased SFA.  I do think she would benefit from aortogram with right leg arteriogram and possible intervention to optimize her inflow.  Discussed I do not think she needs a Unna boot for venous disease given no evidence of venous insufficiency.  We will put her in a wet-to-dry dressing.  She sees Dr.  Sharol Given again later this week and I will send him a note.  Scheduled for arteriogram and intervention with me next week.  Risk benefits discussed.   Marty Heck, MD [facility] Office: [phone]

## 2020-11-08 ENCOUNTER — Ambulatory Visit (INDEPENDENT_AMBULATORY_CARE_PROVIDER_SITE_OTHER): Payer: Medicare Other | Admitting: Physician Assistant

## 2020-11-08 ENCOUNTER — Encounter: Payer: Self-pay | Admitting: Orthopedic Surgery

## 2020-11-08 ENCOUNTER — Other Ambulatory Visit: Payer: Self-pay

## 2020-11-08 DIAGNOSIS — S81801A Unspecified open wound, right lower leg, initial encounter: Secondary | ICD-10-CM | POA: Diagnosis not present

## 2020-11-08 DIAGNOSIS — I2510 Atherosclerotic heart disease of native coronary artery without angina pectoris: Secondary | ICD-10-CM

## 2020-11-08 DIAGNOSIS — Z9861 Coronary angioplasty status: Secondary | ICD-10-CM

## 2020-11-08 MED ORDER — SILVER SULFADIAZINE 1 % EX CREA: 1.0000 "application " | TOPICAL_CREAM | Freq: Every day | CUTANEOUS | 0 refills | Status: DC

## 2020-11-08 NOTE — Progress Notes (Signed)
Office Visit Note   Patient: Tammy Good           Date of Birth: 1950/05/13           MRN: [id number] Visit Date: 11/01/2020              Requested by: [name], MD [address] [place],  [place] [address] PCP: [name], MD  Chief Complaint  Patient presents with   Right Leg - Follow-up      HPI: Patient is a 70 year old woman who presents in follow-up for a traumatic wound right anterior tibia.  She has been undergoing compression wraps with a Silvadene dressing change.  There has been very minimal change to the wound.  She was first evaluated on July 5 about a month ago after striking her leg on the car door.  Patient has been treated with compression stockings to see if the mixed venous and arterial insufficiency wound would heal with compression.  Patient was initially seen by vascular vein surgery in July and patient had ankle-brachial indices on the right of 0.53 and on the left 0.72.  Assessment & Plan: Visit Diagnoses:  1. Traumatic open wound of right lower leg, initial encounter     Plan: With patient's wound that has failed to significantly improve with compression wraps we will have her follow-up with vascular vein surgery to evaluate for arterial intervention.  Follow-Up Instructions: Return in about 1 week (around 11/08/2020).   Ortho Exam  Patient is alert, oriented, no adenopathy, well-dressed, normal affect, normal respiratory effort. Examinations patient's wound is 3 x 5 cm.  The granulation tissue has improved but there is no signs of healing.  There is no cellulitis no dermatitis.  Imaging: No results found.   Labs: Lab Results  Component Value Date   HGBA1C 6.9 (H) 09/02/2013   ESRSEDRATE 52 (H) 08/30/2013   REPTSTATUS 05/30/2019 FINAL 05/25/2019   GRAMSTAIN NO WBC SEEN NO ORGANISMS SEEN  05/25/2019   CULT  05/25/2019    RARE METHICILLIN RESISTANT STAPHYLOCOCCUS AUREUS RESULT CALLED TO, READ BACK BY AND VERIFIED WITH: RN J  BERIS [phone] MLM NO ANAEROBES ISOLATED Performed at [facility], [place] [address], [place],  [address]    LABORGA METHICILLIN RESISTANT STAPHYLOCOCCUS AUREUS 05/25/2019     Lab Results  Component Value Date   ALBUMIN 2.8 (L) 05/23/2019   ALBUMIN 3.8 09/16/2013   ALBUMIN 3.3 (L) 08/29/2013    Lab Results  Component Value Date   MG 1.6 (L) 05/30/2019   MG 1.6 (L) 05/27/2019   MG 2.1 08/29/2013   No results found for: VD25OH  No results found for: PREALBUMIN CBC EXTENDED Latest Ref Rng & Units 05/30/2019 05/27/2019 05/26/2019  WBC 4.0 - 10.5 K/uL 9.9 6.8 7.3  RBC 3.87 - 5.11 MIL/uL 3.43(L) 3.36(L) 3.33(L)  HGB 12.0 - 15.0 g/dL 9.9(L) 9.5(L) 9.6(L)  HCT 36.0 - 46.0 % 32.6(L) 31.5(L) 31.4(L)  PLT 150 - 400 K/uL 471(H) 450(H) 518(H)  NEUTROABS 1.7 - 7.7 K/uL 7.3 4.0 -  LYMPHSABS 0.7 - 4.0 K/uL 1.3 1.6 -     There is no height or weight on file to calculate BMI.  Orders:  Orders Placed This Encounter  Procedures   Ambulatory referral to Vascular Surgery   No orders of the defined types were placed in this encounter.    Procedures: No procedures performed  Clinical Data: No additional findings.  ROS:  All other systems negative, except as noted in  the HPI. Review of Systems  Objective: Vital Signs: There were no vitals taken for this visit.  Specialty Comments:  No specialty comments available.  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   PAD (peripheral artery disease) ([place]) 05/26/2019   Osteomyelitis of left foot ([place]) 05/24/2019   Osteomyelitis ([place]) 05/23/2019   Cellulitis  and abscess of foot, except toes 05/18/2019   Chronic osteomyelitis of ankle and foot ([place]) 05/18/2019   Cholesteatoma 02/24/2019   Cellulitis and abscess of toe 11/09/2018   Colon cancer screening 11/30/2017   Long term (current) use of insulin ([place]) 03/05/2017   Iron deficiency anemia 12/10/2014   Obesity (BMI 30-39.9) 11/17/2013   Diabetes mellitus type 2 in obese [facility])    Essential hypertension    Right thigh pain 11/01/2013   PVC's (premature ventricular contractions) 11/01/2013   Status post insertion of drug eluting coronary artery stent to [facility] emergently and PTCA to prox. 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   ST elevation myocardial infarction (STEMI) of inferior wall, subsequent episode of care [facility]) 08/22/2013   CAD S/P percutaneous coronary angioplasty 08/22/2013   Past Medical History:  Diagnosis Date   CAD S/P percutaneous coronary angioplasty 08/2013   100% mRCA - PCI Integrity Resolute DES 3.0 mm x 38 mm - 3.35 mm; PTCA of RPA V 2.0 mm x 15 mm   Cholesteatoma    right   Diabetes mellitus type 2 in obese (HCC)    On insulin and Invokana   History of osteomyelitis L 5th Toe all 05/2019   s/p Partial Ray Amputation with partial closure; 6 wks Abx & LSFA Atherectomy/DEB PTA with Stent for focal dissection.   Hyperlipidemia with target LDL less than 70    Hypothyroidism (acquired)    Mild essential hypertension    Obesity (BMI 30-39.9) 11/17/2013   PAD (peripheral artery disease) ([place]) 05/26/2019   05/26/19: Abd AoGram- BLE runoff -> L SFA orbital atherectomy - PTA w/ DEB & Stent 6 x 40 Luttonix (for focal dissection) - patent Pop A with 3 V runoff. LEA Dopplers 01/03/2020: RABI (prev) 0.68 (0.53)/ RTBI (prev) 0.58 (0.33); LABI (prev) 0.80 (0.64), LTBI (prev) 0.64 (0.51); R mSFA ~50-74%, L mSFA 50-74%. Patent Prox SFA stent  < 49% stenosis   Panhypopituitarism (HCC)    ST elevation myocardial infarction (STEMI) of inferior wall, subsequent episode of care ([place]) 08/2013   80% branch of D1, 40% mid AV groove circumflex, 100% RCA with subacute thrombus -- thrombus extending into RPA V with 100% occlusion after initial angioplasty of mid RCA ;; Post MI ECHO 6/9/'15: EF 50-55%, mild LVH with moderate HK of inferior wall, Gr1 DD, mild LA dilation; mildly reduced RV function    Family History  Problem Relation Age of Onset   Cancer Mother 39       multiple myeloma   Heart attack Father 4   Cancer Sister    Alzheimer's disease Maternal Grandmother     Past Surgical History:  Procedure Laterality Date  ABDOMINAL AORTOGRAM W/LOWER EXTREMITY N/A 05/26/2019   Procedure: ABDOMINAL AORTOGRAM W/LOWER EXTREMITY;  Surgeon: Marty Heck, MD;  Location: [place] [facility];  Service: Cardiovascular;  Laterality: N/A;   AMPUTATION Left 05/25/2019   Procedure: AMPUTATION RAY 5th;  Surgeon: Trula Slade, DPM;  Location: [place];  Service: Podiatry;  Laterality: Left;   Cardiac Event Monitor  July-August 2015   Sinus rhythm with PVCs   COLONOSCOPY N/A 08/31/2013   Procedure: COLONOSCOPY;  Surgeon: Juanita Craver, MD;  Location: [facility] ENDOSCOPY;  Service: Endoscopy;  Laterality: N/A;   ESOPHAGOGASTRODUODENOSCOPY N/A 09/01/2013   Procedure: ESOPHAGOGASTRODUODENOSCOPY (EGD);  Surgeon: Beryle Beams, MD;  Location: [facility] ENDOSCOPY;  Service: Endoscopy;  Laterality: N/A;  bedside   LEFT HEART CATHETERIZATION WITH CORONARY ANGIOGRAM N/A 08/30/2013   Procedure: LEFT HEART CATHETERIZATION WITH CORONARY ANGIOGRAM;  Surgeon: Leonie Man, MD;  Location: [facility] CATH LAB: 100% mRCA (thrombus - extends to RPAV), 80% D1, 40% AVG Cx.   PERCUTANEOUS CORONARY STENT INTERVENTION (PCI-S)  08/30/2013   Procedure: PERCUTANEOUS CORONARY STENT INTERVENTION (PCI-S);  Surgeon: Leonie Man, MD;  Location: [facility] CATH LAB;  Integrity Resolute DES 2.0 mm x  38 mm -- 3.35 mm.; PTCA of proximal RPA V. - 3.0 mm x 15 mm balloon   PERIPHERAL VASCULAR ATHERECTOMY  05/26/2019   Procedure: PERIPHERAL VASCULAR ATHERECTOMY;  Surgeon: Marty Heck, MD;  Location: [place] [facility];  Service: Cardiovascular;;  Left SFA   PERIPHERAL VASCULAR INTERVENTION  05/26/2019   Procedure: PERIPHERAL VASCULAR INTERVENTION;  Surgeon: Marty Heck, MD;  Location: [place] [facility];  Service: Cardiovascular;;  Left SFA   PITUITARY SURGERY     TRANSTHORACIC ECHOCARDIOGRAM  08/30/2013   mild LVH. EF 50-55%. Moderate HK of the entire inferior myocardium. GR 1 DD. Mild LA dilation. Mildly reduced RV function   TYMPANOMASTOIDECTOMY Right 12/28/2017   Procedure: RIGHT TYMPANOMASTOIDECTOMY;  Surgeon: Leta Baptist, MD;  Location: [place];  Service: ENT;  Laterality: Right;   Social History   Occupational History   Not on file  Tobacco Use   Smoking status: Former   Smokeless tobacco: Never  Substance and Sexual Activity   Alcohol use: No   Drug use: No   Sexual activity: Not Currently    Birth control/protection: Post-menopausal

## 2020-11-08 NOTE — Progress Notes (Signed)
Office Visit Note   Patient: Tammy Good           Date of Birth: [birth year]           MRN: [id number] Visit Date: 11/08/2020              Requested by: [name], MD [address] [place],  [place] [address] PCP: [name], MD  Chief Complaint  Patient presents with   Right Leg - Follow-up      HPI: Patient is a pleasant 70 year old woman who follows up today for her leg ulcer.  She did have an evaluation that did not be consistent with venous insufficiency.  She is scheduled for an arteriogram with vein and vascular next week.  Overall she feels her wound looks better the swelling is significantly decreased  Assessment & Plan: Visit Diagnoses: No diagnosis found.  Plan: We will begin daily Silvadene dressing changes she may wash this area with antibacterial soap and water.  Follow-up in 2 weeks.  Follow-Up Instructions: No follow-ups on file.   Ortho Exam  Patient is alert, oriented, no adenopathy, well-dressed, normal affect, normal respiratory effort. Examination ulcer has healthy 90% tissue at the base.  It size is stable from previous exam.  She has virtually no swelling no cellulitis no signs of infection  Imaging: No results found. No images are attached to the encounter.  Labs: Lab Results  Component Value Date   HGBA1C 6.9 (H) 09/02/2013   ESRSEDRATE 52 (H) 08/30/2013   REPTSTATUS 05/30/2019 FINAL 05/25/2019   GRAMSTAIN NO WBC SEEN NO ORGANISMS SEEN  05/25/2019   CULT  05/25/2019    RARE METHICILLIN RESISTANT STAPHYLOCOCCUS AUREUS RESULT CALLED TO, READ BACK BY AND VERIFIED WITH: RN J BERIS [phone] NO ANAEROBES ISOLATED Performed at [facility], [place] [address], [place], [place] [address]    LABORGA METHICILLIN RESISTANT STAPHYLOCOCCUS AUREUS 05/25/2019     Lab Results  Component Value Date   ALBUMIN 2.8 (L) 05/23/2019   ALBUMIN 3.8 09/16/2013   ALBUMIN 3.3 (L) 08/29/2013    Lab Results  Component Value Date   MG  1.6 (L) 05/30/2019   MG 1.6 (L) 05/27/2019   MG 2.1 08/29/2013   No results found for: VD25OH  No results found for: PREALBUMIN CBC EXTENDED Latest Ref Rng & Units 05/30/2019 05/27/2019 05/26/2019  WBC 4.0 - 10.5 K/uL 9.9 6.8 7.3  RBC 3.87 - 5.11 MIL/uL 3.43(L) 3.36(L) 3.33(L)  HGB 12.0 - 15.0 g/dL 9.9(L) 9.5(L) 9.6(L)  HCT 36.0 - 46.0 % 32.6(L) 31.5(L) 31.4(L)  PLT 150 - 400 K/uL 471(H) 450(H) 518(H)  NEUTROABS 1.7 - 7.7 K/uL 7.3 4.0 -  LYMPHSABS 0.7 - 4.0 K/uL 1.3 1.6 -     There is no height or weight on file to calculate BMI.  Orders:  No orders of the defined types were placed in this encounter.  Meds ordered this encounter  Medications   silver sulfADIAZINE (SILVADENE) 1 % cream    Sig: Apply 1 application topically daily. Apply thin layer to leg wound daily    Dispense:  50 g    Refill:  0     Procedures: No procedures performed  Clinical Data: No additional findings.  ROS:  All other systems negative, except as noted in the HPI. Review of Systems  Objective: Vital Signs: There were no vitals taken for this visit.  Specialty Comments:  No specialty comments available.  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)    Essential hypertension    Right thigh pain 11/01/2013   PVC's (premature ventricular contractions) 11/01/2013   Status post insertion of drug eluting coronary artery stent to [facility] emergently and PTCA to prox. 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   ST elevation myocardial infarction (STEMI) of inferior wall, subsequent episode of care [facility]) 08/22/2013   CAD S/P percutaneous coronary angioplasty 08/22/2013   Past Medical History:  Diagnosis Date   CAD S/P percutaneous coronary angioplasty 08/2013   100% mRCA - PCI Integrity Resolute DES 3.0 mm x 38 mm - 3.35 mm; PTCA of RPA V 2.0 mm x 15 mm   Cholesteatoma    right   Diabetes mellitus type 2 in obese (HCC)    On insulin and Invokana   History of osteomyelitis L 5th Toe all 05/2019   s/p Partial Ray Amputation with partial closure; 6 wks Abx & LSFA Atherectomy/DEB PTA with Stent for focal dissection.   Hyperlipidemia with target LDL less than 70    Hypothyroidism (acquired)    Mild essential hypertension    Obesity (BMI 30-39.9) 11/17/2013   PAD (peripheral artery disease) ([place]) 05/26/2019   05/26/19: Abd AoGram- BLE runoff -> L SFA orbital atherectomy - PTA w/ DEB & Stent 6 x 40 Luttonix (for focal dissection) - patent Pop A with 3 V runoff. LEA Dopplers 01/03/2020: RABI (prev) 0.68 (0.53)/ RTBI (prev) 0.58 (0.33); LABI (prev) 0.80 (0.64), LTBI (prev) 0.64 (0.51); R mSFA ~50-74%, L mSFA 50-74%. Patent Prox SFA stent < 49% stenosis   Panhypopituitarism (HCC)    ST elevation myocardial infarction (STEMI) of inferior wall, subsequent episode of care ([place]) 08/2013   80% branch of D1, 40% mid AV groove circumflex, 100% RCA with subacute thrombus -- thrombus  extending into RPA V with 100% occlusion after initial angioplasty of mid RCA ;; Post MI ECHO 6/9/'15: EF 50-55%, mild LVH with moderate HK of inferior wall, Gr1 DD, mild LA dilation; mildly reduced RV function    Family History  Problem Relation Age of Onset   Cancer Mother 71       multiple myeloma   Heart attack Father 5   Cancer Sister    Alzheimer's disease Maternal Grandmother     Past Surgical History:  Procedure Laterality Date   ABDOMINAL AORTOGRAM W/LOWER EXTREMITY N/A 05/26/2019   Procedure: ABDOMINAL AORTOGRAM W/LOWER EXTREMITY;  Surgeon: Marty Heck, MD;  Location: [place] [facility];  Service: Cardiovascular;  Laterality: N/A;  AMPUTATION Left 05/25/2019   Procedure: AMPUTATION RAY 5th;  Surgeon: Trula Slade, DPM;  Location: [place];  Service: Podiatry;  Laterality: Left;   Cardiac Event Monitor  July-August 2015   Sinus rhythm with PVCs   COLONOSCOPY N/A 08/31/2013   Procedure: COLONOSCOPY;  Surgeon: Juanita Craver, MD;  Location: [facility] ENDOSCOPY;  Service: Endoscopy;  Laterality: N/A;   ESOPHAGOGASTRODUODENOSCOPY N/A 09/01/2013   Procedure: ESOPHAGOGASTRODUODENOSCOPY (EGD);  Surgeon: Beryle Beams, MD;  Location: [facility] ENDOSCOPY;  Service: Endoscopy;  Laterality: N/A;  bedside   LEFT HEART CATHETERIZATION WITH CORONARY ANGIOGRAM N/A 08/30/2013   Procedure: LEFT HEART CATHETERIZATION WITH CORONARY ANGIOGRAM;  Surgeon: Leonie Man, MD;  Location: [facility] CATH LAB: 100% mRCA (thrombus - extends to RPAV), 80% D1, 40% AVG Cx.   PERCUTANEOUS CORONARY STENT INTERVENTION (PCI-S)  08/30/2013   Procedure: PERCUTANEOUS CORONARY STENT INTERVENTION (PCI-S);  Surgeon: Leonie Man, MD;  Location: [facility] CATH LAB;  Integrity Resolute DES 2.0 mm x 38 mm -- 3.35 mm.; PTCA of proximal RPA V. - 3.0 mm x 15 mm balloon   PERIPHERAL VASCULAR ATHERECTOMY  05/26/2019   Procedure: PERIPHERAL VASCULAR ATHERECTOMY;  Surgeon: Marty Heck, MD;  Location: [place] [facility];  Service:  Cardiovascular;;  Left SFA   PERIPHERAL VASCULAR INTERVENTION  05/26/2019   Procedure: PERIPHERAL VASCULAR INTERVENTION;  Surgeon: Marty Heck, MD;  Location: [place] [facility];  Service: Cardiovascular;;  Left SFA   PITUITARY SURGERY     TRANSTHORACIC ECHOCARDIOGRAM  08/30/2013   mild LVH. EF 50-55%. Moderate HK of the entire inferior myocardium. GR 1 DD. Mild LA dilation. Mildly reduced RV function   TYMPANOMASTOIDECTOMY Right 12/28/2017   Procedure: RIGHT TYMPANOMASTOIDECTOMY;  Surgeon: Leta Baptist, MD;  Location: [place];  Service: ENT;  Laterality: Right;   Social History   Occupational History   Not on file  Tobacco Use   Smoking status: Former   Smokeless tobacco: Never  Substance and Sexual Activity   Alcohol use: No   Drug use: No   Sexual activity: Not Currently    Birth control/protection: Post-menopausal

## 2020-11-15 ENCOUNTER — Other Ambulatory Visit: Payer: Self-pay

## 2020-11-15 ENCOUNTER — Encounter (HOSPITAL_COMMUNITY)
Admission: RE | Disposition: A | Discharge status: Home / Self Care | Payer: Self-pay | Source: Home / Self Care | Attending: Vascular Surgery

## 2020-11-15 ENCOUNTER — Inpatient Hospital Stay (HOSPITAL_COMMUNITY)
Admission: RE | Admit: 2020-11-15 | Discharge: 2020-11-22 | DRG: 246 | Disposition: A | Discharge status: Home / Self Care | Payer: Medicare Other | Attending: Vascular Surgery | Admitting: Vascular Surgery

## 2020-11-15 DIAGNOSIS — R42 Dizziness and giddiness: Secondary | ICD-10-CM

## 2020-11-15 DIAGNOSIS — Z8249 Family history of ischemic heart disease and other diseases of the circulatory system: Secondary | ICD-10-CM

## 2020-11-15 DIAGNOSIS — E785 Hyperlipidemia, unspecified: Secondary | ICD-10-CM | POA: Diagnosis present

## 2020-11-15 DIAGNOSIS — Z79899 Other long term (current) drug therapy: Secondary | ICD-10-CM

## 2020-11-15 DIAGNOSIS — Z955 Presence of coronary angioplasty implant and graft: Secondary | ICD-10-CM

## 2020-11-15 DIAGNOSIS — Z7982 Long term (current) use of aspirin: Secondary | ICD-10-CM

## 2020-11-15 DIAGNOSIS — E039 Hypothyroidism, unspecified: Secondary | ICD-10-CM | POA: Diagnosis present

## 2020-11-15 DIAGNOSIS — I959 Hypotension, unspecified: Secondary | ICD-10-CM

## 2020-11-15 DIAGNOSIS — I5021 Acute systolic (congestive) heart failure: Secondary | ICD-10-CM | POA: Diagnosis not present

## 2020-11-15 DIAGNOSIS — I110 Hypertensive heart disease with heart failure: Secondary | ICD-10-CM | POA: Diagnosis present

## 2020-11-15 DIAGNOSIS — I2510 Atherosclerotic heart disease of native coronary artery without angina pectoris: Secondary | ICD-10-CM | POA: Diagnosis present

## 2020-11-15 DIAGNOSIS — Z20822 Contact with and (suspected) exposure to covid-19: Secondary | ICD-10-CM | POA: Diagnosis present

## 2020-11-15 DIAGNOSIS — Z7984 Long term (current) use of oral hypoglycemic drugs: Secondary | ICD-10-CM

## 2020-11-15 DIAGNOSIS — I739 Peripheral vascular disease, unspecified: Secondary | ICD-10-CM | POA: Diagnosis present

## 2020-11-15 DIAGNOSIS — I214 Non-ST elevation (NSTEMI) myocardial infarction: Secondary | ICD-10-CM

## 2020-11-15 DIAGNOSIS — Y832 Surgical operation with anastomosis, bypass or graft as the cause of abnormal reaction of the patient, or of later complication, without mention of misadventure at the time of the procedure: Secondary | ICD-10-CM | POA: Diagnosis not present

## 2020-11-15 DIAGNOSIS — L97519 Non-pressure chronic ulcer of other part of right foot with unspecified severity: Secondary | ICD-10-CM

## 2020-11-15 DIAGNOSIS — Z794 Long term (current) use of insulin: Secondary | ICD-10-CM

## 2020-11-15 DIAGNOSIS — E1151 Type 2 diabetes mellitus with diabetic peripheral angiopathy without gangrene: Principal | ICD-10-CM | POA: Diagnosis present

## 2020-11-15 DIAGNOSIS — I252 Old myocardial infarction: Secondary | ICD-10-CM

## 2020-11-15 DIAGNOSIS — Z87891 Personal history of nicotine dependence: Secondary | ICD-10-CM

## 2020-11-15 DIAGNOSIS — Z7902 Long term (current) use of antithrombotics/antiplatelets: Secondary | ICD-10-CM

## 2020-11-15 DIAGNOSIS — Z79890 Hormone replacement therapy (postmenopausal): Secondary | ICD-10-CM

## 2020-11-15 DIAGNOSIS — I493 Ventricular premature depolarization: Secondary | ICD-10-CM | POA: Diagnosis not present

## 2020-11-15 DIAGNOSIS — I255 Ischemic cardiomyopathy: Secondary | ICD-10-CM | POA: Diagnosis present

## 2020-11-15 DIAGNOSIS — R059 Cough, unspecified: Secondary | ICD-10-CM

## 2020-11-15 DIAGNOSIS — I70221 Atherosclerosis of native arteries of extremities with rest pain, right leg: Secondary | ICD-10-CM | POA: Diagnosis present

## 2020-11-15 DIAGNOSIS — Z807 Family history of other malignant neoplasms of lymphoid, hematopoietic and related tissues: Secondary | ICD-10-CM

## 2020-11-15 DIAGNOSIS — E876 Hypokalemia: Secondary | ICD-10-CM | POA: Diagnosis not present

## 2020-11-15 DIAGNOSIS — I472 Ventricular tachycardia: Secondary | ICD-10-CM | POA: Diagnosis not present

## 2020-11-15 DIAGNOSIS — I9589 Other hypotension: Secondary | ICD-10-CM | POA: Diagnosis not present

## 2020-11-15 DIAGNOSIS — I97191 Other postprocedural cardiac functional disturbances following other surgery: Secondary | ICD-10-CM | POA: Diagnosis not present

## 2020-11-15 DIAGNOSIS — D62 Acute posthemorrhagic anemia: Secondary | ICD-10-CM | POA: Diagnosis not present

## 2020-11-15 DIAGNOSIS — E0580 Other thyrotoxicosis without thyrotoxic crisis or storm: Secondary | ICD-10-CM | POA: Diagnosis present

## 2020-11-15 DIAGNOSIS — R0602 Shortness of breath: Principal | ICD-10-CM

## 2020-11-15 DIAGNOSIS — I480 Paroxysmal atrial fibrillation: Secondary | ICD-10-CM | POA: Diagnosis not present

## 2020-11-15 HISTORY — PX: PERIPHERAL VASCULAR INTERVENTION: CATH118257

## 2020-11-15 HISTORY — PX: ABDOMINAL AORTOGRAM W/LOWER EXTREMITY: CATH118223

## 2020-11-15 LAB — POCT I-STAT, CHEM 8
BUN: 12 mg/dL (ref 8–23)
Calcium, Ion: 1.18 mmol/L (ref 1.15–1.40)
Chloride: 104 mmol/L (ref 98–111)
Creatinine, Ser: 0.8 mg/dL (ref 0.44–1.00)
Glucose, Bld: 128 mg/dL — ABNORMAL HIGH (ref 70–99)
HCT: 38 % (ref 36.0–46.0)
Hemoglobin: 12.9 g/dL (ref 12.0–15.0)
Potassium: 3.5 mmol/L (ref 3.5–5.1)
Sodium: 143 mmol/L (ref 135–145)
TCO2: 27 mmol/L (ref 22–32)

## 2020-11-15 LAB — GLUCOSE, CAPILLARY
Glucose-Capillary: 166 mg/dL — ABNORMAL HIGH (ref 70–99)
Glucose-Capillary: 216 mg/dL — ABNORMAL HIGH (ref 70–99)

## 2020-11-15 LAB — CBC
HCT: 35 % — ABNORMAL LOW (ref 36.0–46.0)
Hemoglobin: 10.6 g/dL — ABNORMAL LOW (ref 12.0–15.0)
MCH: 28 pg (ref 26.0–34.0)
MCHC: 30.3 g/dL (ref 30.0–36.0)
MCV: 92.6 fL (ref 80.0–100.0)
Platelets: 436 10*3/uL — ABNORMAL HIGH (ref 150–400)
RBC: 3.78 MIL/uL — ABNORMAL LOW (ref 3.87–5.11)
RDW: 16.6 % — ABNORMAL HIGH (ref 11.5–15.5)
WBC: 10.4 10*3/uL (ref 4.0–10.5)
nRBC: 0 % (ref 0.0–0.2)

## 2020-11-15 LAB — HEMOGLOBIN A1C
Hgb A1c MFr Bld: 6.7 % — ABNORMAL HIGH (ref 4.8–5.6)
Mean Plasma Glucose: 145.59 mg/dL

## 2020-11-15 SURGERY — ABDOMINAL AORTOGRAM W/LOWER EXTREMITY
Anesthesia: LOCAL | Laterality: Right

## 2020-11-15 MED ORDER — ASPIRIN EC 81 MG PO TBEC
81.0000 mg | DELAYED_RELEASE_TABLET | Freq: Every day | ORAL | Status: DC
  Administered 2020-11-16 – 2020-11-20 (×4): 81 mg via ORAL
  Filled 2020-11-15 (×5): qty 1

## 2020-11-15 MED ORDER — LACTATED RINGERS IV SOLN: INTRAVENOUS | Status: DC

## 2020-11-15 MED ORDER — SODIUM CHLORIDE 0.9% FLUSH
3.0000 mL | Freq: Two times a day (BID) | INTRAVENOUS | Status: DC
  Administered 2020-11-15 – 2020-11-21 (×5): 3 mL via INTRAVENOUS

## 2020-11-15 MED ORDER — HEPARIN (PORCINE) IN NACL 1000-0.9 UT/500ML-% IV SOLN
INTRAVENOUS | Status: AC
  Filled 2020-11-15: qty 1000

## 2020-11-15 MED ORDER — HEPARIN SODIUM (PORCINE) 1000 UNIT/ML IJ SOLN
INTRAMUSCULAR | Status: DC | PRN
  Administered 2020-11-15: 10000 [IU] via INTRAVENOUS

## 2020-11-15 MED ORDER — MIDAZOLAM HCL 2 MG/2ML IJ SOLN
INTRAMUSCULAR | Status: AC
  Filled 2020-11-15: qty 2

## 2020-11-15 MED ORDER — CLOPIDOGREL BISULFATE 75 MG PO TABS
ORAL_TABLET | ORAL | Status: DC | PRN
  Administered 2020-11-15: 75 mg via ORAL

## 2020-11-15 MED ORDER — ACETAMINOPHEN 325 MG PO TABS: 650.0000 mg | ORAL_TABLET | ORAL | Status: DC | PRN

## 2020-11-15 MED ORDER — MIDAZOLAM HCL 2 MG/2ML IJ SOLN
INTRAMUSCULAR | Status: DC | PRN
  Administered 2020-11-15: 1 mg via INTRAVENOUS

## 2020-11-15 MED ORDER — CLOPIDOGREL BISULFATE 75 MG PO TABS
ORAL_TABLET | ORAL | Status: AC
  Filled 2020-11-15: qty 1

## 2020-11-15 MED ORDER — SODIUM CHLORIDE 0.9% FLUSH: 3.0000 mL | INTRAVENOUS | Status: DC | PRN

## 2020-11-15 MED ORDER — IODIXANOL 320 MG/ML IV SOLN
INTRAVENOUS | Status: DC | PRN
  Administered 2020-11-15: 135 mL via INTRA_ARTERIAL

## 2020-11-15 MED ORDER — ASPIRIN 81 MG PO CHEW
CHEWABLE_TABLET | ORAL | Status: DC | PRN
  Administered 2020-11-15: 81 mg via ORAL

## 2020-11-15 MED ORDER — ONDANSETRON HCL 4 MG/2ML IJ SOLN
4.0000 mg | Freq: Four times a day (QID) | INTRAMUSCULAR | Status: DC | PRN
  Administered 2020-11-15 – 2020-11-17 (×4): 4 mg via INTRAVENOUS
  Filled 2020-11-15 (×4): qty 2

## 2020-11-15 MED ORDER — SODIUM CHLORIDE 0.9 % IV SOLN: 250.0000 mL | INTRAVENOUS | Status: DC | PRN

## 2020-11-15 MED ORDER — HEPARIN SODIUM (PORCINE) 1000 UNIT/ML IJ SOLN
INTRAMUSCULAR | Status: AC
  Filled 2020-11-15: qty 1

## 2020-11-15 MED ORDER — LIDOCAINE HCL (PF) 1 % IJ SOLN
INTRAMUSCULAR | Status: AC
  Filled 2020-11-15: qty 30

## 2020-11-15 MED ORDER — HYDRALAZINE HCL 20 MG/ML IJ SOLN: 5.0000 mg | INTRAMUSCULAR | Status: DC | PRN

## 2020-11-15 MED ORDER — INSULIN ASPART 100 UNIT/ML IJ SOLN
0.0000 [IU] | Freq: Three times a day (TID) | INTRAMUSCULAR | Status: DC
  Administered 2020-11-16 (×2): 3 [IU] via SUBCUTANEOUS
  Administered 2020-11-17: 2 [IU] via SUBCUTANEOUS
  Administered 2020-11-17 (×2): 3 [IU] via SUBCUTANEOUS
  Administered 2020-11-18: 2 [IU] via SUBCUTANEOUS
  Administered 2020-11-18: 3 [IU] via SUBCUTANEOUS
  Administered 2020-11-19 (×2): 2 [IU] via SUBCUTANEOUS
  Administered 2020-11-20 (×2): 3 [IU] via SUBCUTANEOUS
  Administered 2020-11-20: 2 [IU] via SUBCUTANEOUS
  Administered 2020-11-21: 3 [IU] via SUBCUTANEOUS
  Administered 2020-11-21 (×2): 2 [IU] via SUBCUTANEOUS
  Administered 2020-11-22: 3 [IU] via SUBCUTANEOUS

## 2020-11-15 MED ORDER — FENTANYL CITRATE (PF) 100 MCG/2ML IJ SOLN
INTRAMUSCULAR | Status: DC | PRN
  Administered 2020-11-15: 50 ug via INTRAVENOUS

## 2020-11-15 MED ORDER — ASPIRIN 81 MG PO CHEW
CHEWABLE_TABLET | ORAL | Status: AC
  Filled 2020-11-15: qty 1

## 2020-11-15 MED ORDER — CLOPIDOGREL BISULFATE 75 MG PO TABS
75.0000 mg | ORAL_TABLET | Freq: Every day | ORAL | Status: DC
  Administered 2020-11-16 – 2020-11-22 (×7): 75 mg via ORAL
  Filled 2020-11-15 (×7): qty 1

## 2020-11-15 MED ORDER — HYDRALAZINE HCL 20 MG/ML IJ SOLN
5.0000 mg | INTRAMUSCULAR | Status: DC | PRN
Start: 2020-11-15 — End: 2020-11-15

## 2020-11-15 MED ORDER — SODIUM CHLORIDE 0.9 % IV BOLUS
500.0000 mL | Freq: Once | INTRAVENOUS | Status: AC | PRN
  Administered 2020-11-15: 500 mL via INTRAVENOUS

## 2020-11-15 MED ORDER — FENTANYL CITRATE (PF) 100 MCG/2ML IJ SOLN
INTRAMUSCULAR | Status: AC
  Filled 2020-11-15: qty 2

## 2020-11-15 MED ORDER — HEPARIN (PORCINE) IN NACL 1000-0.9 UT/500ML-% IV SOLN
INTRAVENOUS | Status: DC | PRN
  Administered 2020-11-15 (×2): 500 mL

## 2020-11-15 MED ORDER — SODIUM CHLORIDE 0.9 % IV SOLN: INTRAVENOUS | Status: AC

## 2020-11-15 MED ORDER — ACETAMINOPHEN 325 MG PO TABS
650.0000 mg | ORAL_TABLET | ORAL | Status: DC | PRN
  Administered 2020-11-15 – 2020-11-17 (×2): 650 mg via ORAL
  Filled 2020-11-15 (×2): qty 2

## 2020-11-15 MED ORDER — LIDOCAINE HCL (PF) 1 % IJ SOLN
INTRAMUSCULAR | Status: DC | PRN
  Administered 2020-11-15: 15 mL via INTRADERMAL

## 2020-11-15 MED ORDER — LABETALOL HCL 5 MG/ML IV SOLN: 10.0000 mg | INTRAVENOUS | Status: DC | PRN

## 2020-11-15 MED ORDER — SODIUM CHLORIDE 0.9 % IV SOLN
INTRAVENOUS | Status: DC
Start: 2020-11-15 — End: 2020-11-15

## 2020-11-15 MED ORDER — ONDANSETRON HCL 4 MG/2ML IJ SOLN
4.0000 mg | Freq: Four times a day (QID) | INTRAMUSCULAR | Status: DC | PRN
  Filled 2020-11-15: qty 2

## 2020-11-15 MED ORDER — INSULIN ASPART 100 UNIT/ML IJ SOLN
0.0000 [IU] | Freq: Every day | INTRAMUSCULAR | Status: DC
  Administered 2020-11-15: 2 [IU] via SUBCUTANEOUS

## 2020-11-15 MED ORDER — SODIUM CHLORIDE 0.9% FLUSH
3.0000 mL | Freq: Two times a day (BID) | INTRAVENOUS | Status: DC
  Administered 2020-11-15: 3 mL via INTRAVENOUS

## 2020-11-15 MED ORDER — SODIUM CHLORIDE 0.9 % IV SOLN: INTRAVENOUS | Status: DC

## 2020-11-15 SURGICAL SUPPLY — 21 items
BALLN MUSTANG 5X100X135 (BALLOONS) ×3
BALLN MUSTANG 5X150X135 (BALLOONS) ×3
BALLOON MUSTANG 5X100X135 (BALLOONS) IMPLANT
BALLOON MUSTANG 5X150X135 (BALLOONS) IMPLANT
CATH ANGIO 5F BER2 65CM (CATHETERS) ×1 IMPLANT
CATH MUSTANG 3X80X135 (BALLOONS) ×1 IMPLANT
CATH OMNI FLUSH 5F 65CM (CATHETERS) ×1 IMPLANT
DEVICE CLOSURE MYNXGRIP 6/7F (Vascular Products) ×1 IMPLANT
GLIDEWIRE ADV .035X260CM (WIRE) ×1 IMPLANT
KIT ENCORE 26 ADVANTAGE (KITS) ×1 IMPLANT
KIT MICROPUNCTURE NIT STIFF (SHEATH) ×1 IMPLANT
KIT PV (KITS) ×3 IMPLANT
MAT PREVALON FULL STRYKER (MISCELLANEOUS) ×1 IMPLANT
SHEATH FLEX ANSEL ANG 6F 45CM (SHEATH) ×1 IMPLANT
SHEATH PINNACLE 5F 10CM (SHEATH) ×1 IMPLANT
SHEATH PINNACLE 6F 10CM (SHEATH) ×1 IMPLANT
STENT ELUVIA 6X150X130 (Permanent Stent) ×2 IMPLANT
SYR MEDRAD MARK V 150ML (SYRINGE) ×1 IMPLANT
TRANSDUCER W/STOPCOCK (MISCELLANEOUS) ×3 IMPLANT
TRAY PV CATH (CUSTOM PROCEDURE TRAY) ×3 IMPLANT
WIRE STARTER BENTSON 035X150 (WIRE) ×1 IMPLANT

## 2020-11-15 NOTE — Progress Notes (Signed)
Called with concern for left groin hematoma after transfemoral access for right lower extremity intervention.  She was assessed by my partner Dr. Stanford Breed while I was in a procedure.  I just examined her in recovery and hemodynamics are now stable.  Her left groin appears soft and I do not feel any overt large hematoma.  Will admit for observation overnight.  I would like to keep her flat until midnight.  CBC has been sent.  Marty Heck, MD [facility] [place] Office: [place]

## 2020-11-15 NOTE — Progress Notes (Signed)
Report called to 4E. Awaiting bed.

## 2020-11-15 NOTE — Op Note (Signed)
Patient name: Tammy Good MRN: [id number] DOB: 25-Oct-1950 Sex: female  11/15/2020 Pre-operative Diagnosis: Nonhealing right foot wound with severe peripheral arterial disease Post-operative diagnosis:  Same Surgeon:  Marty Heck, MD Procedure Performed: 1.  Ultrasound-guided access left common femoral artery 2.  Aortogram including catheter selection of aorta 3.  Right lower extremity arteriogram with selection of third order branches 4.  Right mid to distal SFA and above-knee popliteal artery angioplasty with stent placement (predilated with 3 mm Mustang, stented with 6 mm x 150 mm drug-coated Eluvia x2, postdilated with a 5 mm Mustang) 5.  Mynx closure of the left common femoral artery 6.  68 minutes of monitored moderate conscious sedation time  Indications: Patient is a 70 year old female well-known to vascular surgery who has previously undergone left leg intervention for tissue loss.  She presented with nonhealing right calf wound with known peripheral arterial disease.  She presents today for lower extremity arteriogram and possible intervention after risks and benefits discussed.  Findings:   Aortogram showed no flow-limiting stenosis in the aortoiliac segment.  Right lower extremity arteriogram showed patent common femoral profunda with a diffusely diseased mid to distal SFA with multifocal high-grade stenosis greater than 70% with heavy calcification as well as a focal high-grade heavy calcification in the above-knee popliteal artery greater than 80%.  The below-knee popliteal artery was patent with three-vessel tibial runoff.  Ultimately the right lower extremity SFA and above-knee popliteal lesions were crossed.  I elected to primarily stent these but could not get the stent to track and had to predilate with a 3 mm Mustang.  I then placed two 6 mm x 150 mm drug-coated Eluvia's ll postdilated with 5 mm balloons.  Excellent results with no residual stenosis and  widely patent stents.  Preserved three vessel runoff.   Procedure:  The patient was identified in the holding area and taken to room 8.  The patient was then placed supine on the table and prepped and draped in the usual sterile fashion.  A time out was called.  Ultrasound was used to evaluate the left common femoral artery.  It was patent .  A digital ultrasound image was acquired.  A micropuncture needle was used to access the left common femoral artery under ultrasound guidance.  An 018 wire was advanced without resistance and a micropuncture sheath was placed.  The 018 wire was removed and a benson wire was placed.  The micropuncture sheath was exchanged for a 5 french sheath.  An omniflush catheter was advanced over the wire to the level of L-1.  An abdominal angiogram was obtained.  Next, using the omniflush catheter and a benson wire, the aortic bifurcation was crossed and the catheter was placed into theright external iliac artery and right runoff was obtained.  Ultimately elected to intervene.  I then used a Glidewire advantage and got my wire down the profunda.  A long 6 [place] Ansell sheath was placed in the left groin over the aortic bifurcation.  The patient was given 100 units/kg IV heparin.  I then used a KMP catheter to cannulate the SFA and got my wire down the SFA above-knee popliteal artery stenosis into the below-knee popliteal artery across all the high-grade stenosis.  We then elected to primarily stent in the above-knee popliteal artery up to the SFA disease.  Attempted to place a long Eluvia stent but would not track through the SFA.  I then had to predilate this with a 3 mm  x 80 mm Mustang.  I then got my stent in the above-knee popliteal artery to pull it up into the distal SFA with a 6 mm x 150 mm drug-coated Eluvia postdilated with a 5 mm balloon.  I then placed a second stent with a 6 mm x 150 mm Eluvia again postdilated with a 5 mm balloon.  Excellent results with widely patent stent  with no residual stenosis.  Preserved runoff in the popliteal artery distal to the stents with three-vessel runoff.  Wires and catheters removed.  A left groin access shot was obtained and a mynx closure device was deployed.   Plan: Patient will continue dual endplate therapy with aspirin Plavix.  We will arrange follow-up in 1 month with non-invasive imaging.  She is optimized for wound healing.  Marty Heck, MD [facility] [place] Office: [phone]

## 2020-11-15 NOTE — Significant Event (Addendum)
Rapid Response Event Note   Reason for Call :  Unresponsive, irregular breathing Hypotension 91/41 L groin hematoma  Initial Focused Assessment:  Pt lying in bed, drowsy. Skin is warm, moist. Skin color initially pale, now pink. Pt endorses pain to her left groin where RN is holding pressure. No other complaints. Mentation and BP improving with IVF bolus. L groin pressure held for 45 minutes. Hematoma reduced, no border felt.   VS: BP 91/41, HR 94, RR 25, SpO2 100 % 3LNC CBG: 166  Interventions:  -500 cc bolus -CBC  Plan of Care:  -Admit inpatient  -Mark groin site- if applicable -Increase frequency of VS checks -Vascular checks per order  Call rapid response for additional needs  Event Summary:  MD Notified: Dr. Stanford Breed at bedside Call Time: [place] Time: [id number] End Time: [place], RN

## 2020-11-15 NOTE — Progress Notes (Signed)
Patient called out to front desk and stated that she was sweaty and she believed her blood sugar had dropped. This RN immediately went into the room to assess patient and found that she was struggling to breath and losing consciousness. Naaman Plummer, [place] and Denice Paradise, RN at bedside. L Groin site found to have hematoma. Pressure applied and held. Rapid Response RN notified and at bedside. MD called and at bedside. Verbal orders given for CBC, 500 cc bolus, and to admit patient for overnight observation. Please refer to flowsheets for vital signs. After fluid bolus given, patient more alert and oriented. Pressure held to L groin site for 45 minutes until hematoma no longer present. Patients sister called and updated. Will call report to 4E when bed ready.

## 2020-11-15 NOTE — H&P (Signed)
History and Physical Interval Note:  11/15/2020 11:26 AM  [name]  has presented today for surgery, with the diagnosis of pad.  The various methods of treatment have been discussed with the patient and family. After consideration of risks, benefits and other options for treatment, the patient has consented to  Procedure(s): ABDOMINAL AORTOGRAM W/LOWER EXTREMITY (N/A) as a surgical intervention.  The patient's history has been reviewed, patient examined, no change in status, stable for surgery.  I have reviewed the patient's chart and labs.  Questions were answered to the patient's satisfaction.     Marty Heck  Patient name: Tammy Good   MRN: [id number]        DOB: [birth year]            Sex: female   REASON FOR VISIT: Evaluate right leg venous reflux due to nonhealing wound   HPI: Tammy Good is a 70 y.o. female with hx HTN, HLD, DM, PAD that presents as a referral from [name] to evaluate possible right leg venous reflux with a nonhealing wound.  She states this wound happened in late June when she hit her leg against a car door.  Unfortunately this has not healed even after she went to the ED and the wound was closed.  She has now had breakdown with the open wound.  She has been seen by [name] and has been put in a venous wrap for compression.   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  On 05/26/2019 she underwent left SFA atherectomy with angioplasty including drug-coated balloon and ultimately required a proximal left SFA stent with a 6 x 40 Lutonix for a focal dissection.  She had a patent popliteal artery with three-vessel runoff in the left lower extremity.         Past Medical History:  Diagnosis Date   CAD S/P percutaneous coronary angioplasty 08/2013    100% mRCA - PCI Integrity Resolute DES 3.0 mm x 38 mm - 3.35 mm; PTCA of RPA V 2.0 mm x 15 mm   Cholesteatoma      right   Diabetes mellitus  type 2 in obese (HCC)      On insulin and Invokana   History of osteomyelitis L 5th Toe all 05/2019    s/p Partial Ray Amputation with partial closure; 6 wks Abx & LSFA Atherectomy/DEB PTA with Stent for focal dissection.   Hyperlipidemia with target LDL less than 70     Hypothyroidism (acquired)     Mild essential hypertension     Obesity (BMI 30-39.9) 11/17/2013   PAD (peripheral artery disease) ([place]) 05/26/2019    05/26/19: Abd AoGram- BLE runoff -> L SFA orbital atherectomy - PTA w/ DEB & Stent 6 x 40 Luttonix (for focal dissection) - patent Pop A with 3 V runoff. LEA Dopplers 01/03/2020: RABI (prev) 0.68 (0.53)/ RTBI (prev) 0.58 (0.33); LABI (prev) 0.80 (0.64), LTBI (prev) 0.64 (0.51); R mSFA ~50-74%, L mSFA 50-74%. Patent Prox SFA stent < 49% stenosis   Panhypopituitarism (HCC)     ST elevation myocardial infarction (STEMI) of inferior wall, subsequent episode of care ([place]) 08/2013    80% branch of D1, 40% mid AV groove circumflex, 100% RCA with subacute thrombus -- thrombus extending into RPA V with 100% occlusion after initial angioplasty of mid RCA ;; Post MI ECHO 6/9/'15: EF 50-55%, mild LVH with moderate HK of inferior wall, Gr1 DD, mild  LA dilation; mildly reduced RV function           Past Surgical History:  Procedure Laterality Date   ABDOMINAL AORTOGRAM W/LOWER EXTREMITY N/A 05/26/2019    Procedure: ABDOMINAL AORTOGRAM W/LOWER EXTREMITY;  Surgeon: Marty Heck, MD;  Location: [facility];  Service: Cardiovascular;  Laterality: N/A;   AMPUTATION Left 05/25/2019    Procedure: AMPUTATION RAY 5th;  Surgeon: Trula Slade, DPM;  Location: [facility];  Service: Podiatry;  Laterality: Left;   Cardiac Event Monitor   July-August 2015    Sinus rhythm with PVCs   COLONOSCOPY N/A 08/31/2013    Procedure: COLONOSCOPY;  Surgeon: Juanita Craver, MD;  Location[facility] END[facility];  Service: Endoscopy;  Laterality: N/A;   ESOPHAGOGASTRODUODENOSCOPY N/A 09/01/2013    Procedure:  ESOPHAGOGASTRODUODENOSCOPY (EGD);  Surgeon: Beryle Beams, MD;  Location: [facility] ENDOSCOPY;  Service: Endoscopy;  Laterality: N/A;  bedside   LEFT HEART CATHETERIZATION WITH CORONARY ANGIOGRAM N/A 08/30/2013    Procedure: LEFT HEART CATHETERIZATION WITH CORONARY ANGIOGRAM;  Surgeon: Leonie Man, MD;  Location: [facility] CATH LAB: 100% mRCA (thrombus - extends to RPAV), 80% D1, 40% AVG Cx.   PERCUTANEOUS CORONARY STENT INTERVENTION (PCI-S)   08/30/2013    Procedure: PERCUTANEOUS CORONARY STENT INTERVENTION (PCI-S);  Surgeon: Leonie Man, MD;  Location[facility] C[facility] LAB;  Integrity Resolute DES 2.0 mm x 38 mm -- 3.35 mm.; PTCA of proximal RPA V. - 3.0 mm x 15 mm balloon   PERIPHERAL VASCULAR ATHERECTOMY   05/26/2019    Procedure: PERIPHERAL VASCULAR ATHERECTOMY;  Surgeon: Marty Heck, MD;  Location: [place] [facility];  Service: Cardiovascular;;  Left SFA   PERIPHERAL VASCULAR INTERVENTION   05/26/2019    Procedure: PERIPHERAL VASCULAR INTERVENTION;  Surgeon: Marty Heck, MD;  Location: [place] [facility];  Service: Cardiovascular;;  Left SFA   PITUITARY SURGERY       TRANSTHORACIC ECHOCARDIOGRAM   08/30/2013    mild LVH. EF 50-55%. Moderate HK of the entire inferior myocardium. GR 1 DD. Mild LA dilation. Mildly reduced RV function   TYMPANOMASTOIDECTOMY Right 12/28/2017    Procedure: RIGHT TYMPANOMASTOIDECTOMY;  Surgeon: Leta Baptist, MD;  Location: [place];  Service: ENT;  Laterality: Right;           Family History  Problem Relation Age of Onset   Cancer Mother 68        multiple myeloma   Heart attack Father 47   Cancer Sister     Alzheimer's disease Maternal Grandmother        SOCIAL HISTORY: Social History        Tobacco Use   Smoking status: Former   Smokeless tobacco: Never  Substance Use Topics   Alcohol use: No           Allergies  Allergen Reactions   Strawberry Extract Itching, Swelling and Anaphylaxis      Mouth swells and gets itchy             Current Outpatient Medications  Medication Sig Dispense Refill   aspirin EC 81 MG tablet Take 81 mg by mouth daily.       atorvastatin (LIPITOR) 40 MG tablet Take 1 tablet (40 mg total) by mouth daily at 6 PM. 30 tablet 0   B-D UF III MINI PEN NEEDLES 31G X 5 MM MISC Inject into the skin.       clopidogrel (PLAVIX) 75 MG tablet Take 1 tablet by mouth once  daily with breakfast 90 tablet 0   Continuous Blood Gluc Sensor (FREESTYLE LIBRE 2 SENSOR) MISC Apply topically every 14 (fourteen) days.       dexamethasone (DECADRON) 0.5 MG tablet Take 0.5 mg by mouth daily.       FARXIGA 10 MG TABS tablet Take 10 mg by mouth daily.        ferrous sulfate 325 (65 FE) MG tablet Take 325 mg by mouth daily with breakfast.       Insulin Glargine (BASAGLAR KWIKPEN) 100 UNIT/ML SOPN Inject 48 Units into the skin at bedtime. inject 52 units into skin at bedtime       levothyroxine (SYNTHROID, LEVOTHROID) 175 MCG tablet Take 175 mcg by mouth daily before breakfast.       lisinopril (ZESTRIL) 10 MG tablet Take 1.5 tablets (15 mg total) by mouth daily. 145 tablet 2   metoprolol tartrate (LOPRESSOR) 25 MG tablet TAKE 1 TABLET TWICE A DAY 180 tablet 2   NOVOLOG FLEXPEN 100 UNIT/ML FlexPen Inject 5-14 Units into the skin 3 (three) times daily with meals. Per sliding scale       nystatin cream (MYCOSTATIN) Apply 1 application topically 2 (two) times daily. 30 g 1   Omega-3 Fatty Acids (FISH OIL) 1000 MG CAPS Take 1,000 mg by mouth daily.       ONETOUCH VERIO test strip     1   pantoprazole (PROTONIX) 40 MG tablet Take 1 tablet (40 mg total) by mouth 2 (two) times daily before a meal. 60 tablet 0    No current facility-administered medications for this visit.      REVIEW OF SYSTEMS:  '[X]'  denotes positive finding, '[ ]'  denotes negative finding Cardiac   Comments:  Chest pain or chest pressure:      Shortness of breath upon exertion:      Short of breath when lying flat:      Irregular heart rhythm:              Vascular      Pain in calf, thigh, or hip brought on by ambulation:      Pain in feet at night that wakes you up from your sleep:       Blood clot in your veins:      Leg swelling:              Pulmonary      Oxygen at home:      Productive cough:       Wheezing:              Neurologic      Sudden weakness in arms or legs:       Sudden numbness in arms or legs:       Sudden onset of difficulty speaking or slurred speech:      Temporary loss of vision in one eye:       Problems with dizziness:              Gastrointestinal      Blood in stool:       Vomited blood:              Genitourinary      Burning when urinating:       Blood in urine:             Psychiatric      Major depression:              Hematologic  Bleeding problems:      Problems with blood clotting too easily:             Skin      Rashes or ulcers:             Constitutional      Fever or chills:          PHYSICAL EXAM:    Vitals:    11/06/20 1054  BP: (!) 152/60  Pulse: (!) 54  Temp: (!) 97.5 F (36.4 C)  TempSrc: Temporal  SpO2: 98%  Weight: 232 lb 12.8 oz (105.6 kg)  Height: '5\' 5"'  (1.651 m)      GENERAL: The patient is a well-nourished female, in no acute distress. The vital signs are documented above. CARDIAC: There is a regular rate and rhythm.  VASCULAR:  Bilateral femoral pulses are palpable No palpable pedal pulses in the right foot Right calf wound pictured below       DATA:    Venous reflux today shows no evidence of DVT and no evidence of right leg venous reflux.   ABIs 10/01/2020 were 0.53 on the right biphasic with a toe pressure of 67   Assessment/Plan:   70 year old female presents for evaluation of possible venous reflux in the right leg and concern for venous ulcer.  I discussed with her that her reflux study today shows no evidence of underlying venous insufficiency.  This is a traumatic wound and unfortunately she does have a reduced ABI of 0.5 with a  marginal toe pressure in the 60s.  I reviewed her previous arteriogram images that she has a diffusely diseased SFA.  I do think she would benefit from aortogram with right leg arteriogram and possible intervention to optimize her inflow.  Discussed I do not think she needs a Unna boot for venous disease given no evidence of venous insufficiency.  We will put her in a wet-to-dry dressing.  She sees [name] again later this week and I will send him a note.  Scheduled for arteriogram and intervention with me next week.  Risk benefits discussed.     Marty Heck, MD [facility] Office: [phone]

## 2020-11-15 NOTE — Significant Event (Addendum)
Rapid Response Event Note   Reason for Call :  SBP-80s  Initial Focused Assessment:  Pt lying in bed with eyes closed, unable to get comfortable in the bed. She is alert and oriented, c/o being hot. Pt denies CP/SOB. Her skin is cool to touch. L groin cath site is unchanged from previous markings. Site is soft but tender.  L leg is cooler than R leg. Pulses dopplered on B feet.   T-97.9, HR-94, BP-66/51, RR-19, SpO2-100% on RA.   Interventions:  500cc NS bolus-BP up to 118/43  LR @ 100cc/hr CBC in AM Plan of Care:  Pt feels better after bolus. She is currently sleeping. Continue to monitor groin site and pt closely. Call RRT if further assistance needed.    Event Summary:   MD Notified: Dr. Stanford Breed notified PTA RRT Call Bent Time:  Dillard Essex, RN

## 2020-11-16 ENCOUNTER — Inpatient Hospital Stay (HOSPITAL_COMMUNITY): Payer: Medicare Other

## 2020-11-16 ENCOUNTER — Encounter (HOSPITAL_COMMUNITY): Payer: Self-pay | Admitting: Vascular Surgery

## 2020-11-16 ENCOUNTER — Observation Stay (HOSPITAL_COMMUNITY): Payer: Medicare Other

## 2020-11-16 DIAGNOSIS — I5021 Acute systolic (congestive) heart failure: Secondary | ICD-10-CM | POA: Diagnosis not present

## 2020-11-16 DIAGNOSIS — I2510 Atherosclerotic heart disease of native coronary artery without angina pectoris: Secondary | ICD-10-CM | POA: Diagnosis present

## 2020-11-16 DIAGNOSIS — Z794 Long term (current) use of insulin: Secondary | ICD-10-CM | POA: Diagnosis not present

## 2020-11-16 DIAGNOSIS — I959 Hypotension, unspecified: Secondary | ICD-10-CM | POA: Diagnosis not present

## 2020-11-16 DIAGNOSIS — I214 Non-ST elevation (NSTEMI) myocardial infarction: Secondary | ICD-10-CM | POA: Diagnosis not present

## 2020-11-16 DIAGNOSIS — Z79899 Other long term (current) drug therapy: Secondary | ICD-10-CM | POA: Diagnosis not present

## 2020-11-16 DIAGNOSIS — E785 Hyperlipidemia, unspecified: Secondary | ICD-10-CM | POA: Diagnosis not present

## 2020-11-16 DIAGNOSIS — Y832 Surgical operation with anastomosis, bypass or graft as the cause of abnormal reaction of the patient, or of later complication, without mention of misadventure at the time of the procedure: Secondary | ICD-10-CM | POA: Diagnosis not present

## 2020-11-16 DIAGNOSIS — E039 Hypothyroidism, unspecified: Secondary | ICD-10-CM | POA: Diagnosis not present

## 2020-11-16 DIAGNOSIS — Z4889 Encounter for other specified surgical aftercare: Secondary | ICD-10-CM

## 2020-11-16 DIAGNOSIS — Z8249 Family history of ischemic heart disease and other diseases of the circulatory system: Secondary | ICD-10-CM | POA: Diagnosis not present

## 2020-11-16 DIAGNOSIS — Z79890 Hormone replacement therapy (postmenopausal): Secondary | ICD-10-CM | POA: Diagnosis not present

## 2020-11-16 DIAGNOSIS — I739 Peripheral vascular disease, unspecified: Secondary | ICD-10-CM

## 2020-11-16 DIAGNOSIS — E1151 Type 2 diabetes mellitus with diabetic peripheral angiopathy without gangrene: Secondary | ICD-10-CM | POA: Diagnosis not present

## 2020-11-16 DIAGNOSIS — Z20822 Contact with and (suspected) exposure to covid-19: Secondary | ICD-10-CM | POA: Diagnosis not present

## 2020-11-16 DIAGNOSIS — Z807 Family history of other malignant neoplasms of lymphoid, hematopoietic and related tissues: Secondary | ICD-10-CM | POA: Diagnosis not present

## 2020-11-16 DIAGNOSIS — Z7984 Long term (current) use of oral hypoglycemic drugs: Secondary | ICD-10-CM | POA: Diagnosis not present

## 2020-11-16 DIAGNOSIS — Z955 Presence of coronary angioplasty implant and graft: Secondary | ICD-10-CM | POA: Diagnosis not present

## 2020-11-16 DIAGNOSIS — D62 Acute posthemorrhagic anemia: Secondary | ICD-10-CM | POA: Diagnosis not present

## 2020-11-16 DIAGNOSIS — R748 Abnormal levels of other serum enzymes: Secondary | ICD-10-CM

## 2020-11-16 DIAGNOSIS — Z7982 Long term (current) use of aspirin: Secondary | ICD-10-CM | POA: Diagnosis not present

## 2020-11-16 DIAGNOSIS — I70221 Atherosclerosis of native arteries of extremities with rest pain, right leg: Secondary | ICD-10-CM | POA: Diagnosis not present

## 2020-11-16 DIAGNOSIS — Z7902 Long term (current) use of antithrombotics/antiplatelets: Secondary | ICD-10-CM | POA: Diagnosis not present

## 2020-11-16 DIAGNOSIS — E0580 Other thyrotoxicosis without thyrotoxic crisis or storm: Secondary | ICD-10-CM | POA: Diagnosis not present

## 2020-11-16 DIAGNOSIS — I110 Hypertensive heart disease with heart failure: Secondary | ICD-10-CM | POA: Diagnosis not present

## 2020-11-16 DIAGNOSIS — I4891 Unspecified atrial fibrillation: Secondary | ICD-10-CM | POA: Diagnosis not present

## 2020-11-16 DIAGNOSIS — I951 Orthostatic hypotension: Secondary | ICD-10-CM

## 2020-11-16 DIAGNOSIS — I252 Old myocardial infarction: Secondary | ICD-10-CM | POA: Diagnosis not present

## 2020-11-16 DIAGNOSIS — I472 Ventricular tachycardia: Secondary | ICD-10-CM | POA: Diagnosis not present

## 2020-11-16 DIAGNOSIS — I97191 Other postprocedural cardiac functional disturbances following other surgery: Secondary | ICD-10-CM | POA: Diagnosis not present

## 2020-11-16 DIAGNOSIS — Z87891 Personal history of nicotine dependence: Secondary | ICD-10-CM | POA: Diagnosis not present

## 2020-11-16 LAB — BASIC METABOLIC PANEL
Anion gap: 7 (ref 5–15)
Anion gap: 9 (ref 5–15)
BUN: 11 mg/dL (ref 8–23)
BUN: 8 mg/dL (ref 8–23)
CO2: 23 mmol/L (ref 22–32)
CO2: 20 mmol/L — ABNORMAL LOW (ref 22–32)
Calcium: 7.8 mg/dL — ABNORMAL LOW (ref 8.9–10.3)
Calcium: 7.7 mg/dL — ABNORMAL LOW (ref 8.9–10.3)
Chloride: 110 mmol/L (ref 98–111)
Chloride: 111 mmol/L (ref 98–111)
Creatinine, Ser: 0.87 mg/dL (ref 0.44–1.00)
Creatinine, Ser: 0.94 mg/dL (ref 0.44–1.00)
GFR, Estimated: >60 mL/min (ref >60)
GFR, Estimated: >60 mL/min (ref >60)
Glucose, Bld: 151 mg/dL — ABNORMAL HIGH (ref 70–99)
Glucose, Bld: 170 mg/dL — ABNORMAL HIGH (ref 70–99)
Potassium: 3.5 mmol/L (ref 3.5–5.1)
Potassium: 4.5 mmol/L (ref 3.5–5.1)
Sodium: 140 mmol/L (ref 135–145)
Sodium: 140 mmol/L (ref 135–145)

## 2020-11-16 LAB — CBC WITH DIFFERENTIAL/PLATELET
Abs Immature Granulocytes: 0.08 10*3/uL — ABNORMAL HIGH (ref 0.00–0.07)
Basophils Absolute: 0.1 10*3/uL (ref 0.0–0.1)
Basophils Relative: 1 %
Eosinophils Absolute: 0.3 10*3/uL (ref 0.0–0.5)
Eosinophils Relative: 3 %
HCT: 29.2 % — ABNORMAL LOW (ref 36.0–46.0)
Hemoglobin: 8.9 g/dL — ABNORMAL LOW (ref 12.0–15.0)
Immature Granulocytes: 1 %
Lymphocytes Relative: 14 %
Lymphs Abs: 1.5 10*3/uL (ref 0.7–4.0)
MCH: 28.3 pg (ref 26.0–34.0)
MCHC: 30.5 g/dL (ref 30.0–36.0)
MCV: 92.7 fL (ref 80.0–100.0)
Monocytes Absolute: 1.2 10*3/uL — ABNORMAL HIGH (ref 0.1–1.0)
Monocytes Relative: 12 %
Neutro Abs: 7.3 10*3/uL (ref 1.7–7.7)
Neutrophils Relative %: 69 %
Platelets: 380 10*3/uL (ref 150–400)
RBC: 3.15 MIL/uL — ABNORMAL LOW (ref 3.87–5.11)
RDW: 16.6 % — ABNORMAL HIGH (ref 11.5–15.5)
WBC: 10.4 10*3/uL (ref 4.0–10.5)
nRBC: 0 % (ref 0.0–0.2)

## 2020-11-16 LAB — TSH: TSH: <0.01 u[IU]/mL — ABNORMAL LOW (ref 0.350–4.500)

## 2020-11-16 LAB — CBC
HCT: 31.6 % — ABNORMAL LOW (ref 36.0–46.0)
HCT: 30.5 % — ABNORMAL LOW (ref 36.0–46.0)
Hemoglobin: 9.2 g/dL — ABNORMAL LOW (ref 12.0–15.0)
Hemoglobin: 8.9 g/dL — ABNORMAL LOW (ref 12.0–15.0)
MCH: 27.2 pg (ref 26.0–34.0)
MCH: 27.4 pg (ref 26.0–34.0)
MCHC: 29.1 g/dL — ABNORMAL LOW (ref 30.0–36.0)
MCHC: 29.2 g/dL — ABNORMAL LOW (ref 30.0–36.0)
MCV: 93.5 fL (ref 80.0–100.0)
MCV: 93.8 fL (ref 80.0–100.0)
Platelets: 408 10*3/uL — ABNORMAL HIGH (ref 150–400)
Platelets: 406 10*3/uL — ABNORMAL HIGH (ref 150–400)
RBC: 3.38 MIL/uL — ABNORMAL LOW (ref 3.87–5.11)
RBC: 3.25 MIL/uL — ABNORMAL LOW (ref 3.87–5.11)
RDW: 16.7 % — ABNORMAL HIGH (ref 11.5–15.5)
RDW: 16.6 % — ABNORMAL HIGH (ref 11.5–15.5)
WBC: 10.8 10*3/uL — ABNORMAL HIGH (ref 4.0–10.5)
WBC: 11.3 10*3/uL — ABNORMAL HIGH (ref 4.0–10.5)
nRBC: 0 % (ref 0.0–0.2)
nRBC: 0 % (ref 0.0–0.2)

## 2020-11-16 LAB — GLUCOSE, CAPILLARY
Glucose-Capillary: 161 mg/dL — ABNORMAL HIGH (ref 70–99)
Glucose-Capillary: 114 mg/dL — ABNORMAL HIGH (ref 70–99)
Glucose-Capillary: 160 mg/dL — ABNORMAL HIGH (ref 70–99)
Glucose-Capillary: 146 mg/dL — ABNORMAL HIGH (ref 70–99)

## 2020-11-16 LAB — MAGNESIUM: Magnesium: 2 mg/dL (ref 1.7–2.4)

## 2020-11-16 LAB — TROPONIN I (HIGH SENSITIVITY)
Troponin I (High Sensitivity): 396 ng/L (ref <18)
Troponin I (High Sensitivity): 276 ng/L (ref <18)
Troponin I (High Sensitivity): 801 ng/L (ref <18)
Troponin I (High Sensitivity): 2073 ng/L (ref <18)

## 2020-11-16 LAB — BRAIN NATRIURETIC PEPTIDE: B Natriuretic Peptide: 217.2 pg/mL — ABNORMAL HIGH (ref 0.0–100.0)

## 2020-11-16 LAB — ECHOCARDIOGRAM COMPLETE
AR max vel: 2.35 cm2
AV Area VTI: 1.83 cm2
AV Area mean vel: 2.08 cm2
AV Mean grad: 4 mmHg
AV Peak grad: 7.6 mmHg
Ao pk vel: 1.38 m/s
Height: 65 in
S' Lateral: 3.4 cm
Weight: 3680 oz

## 2020-11-16 LAB — SARS CORONAVIRUS 2 (TAT 6-24 HRS): SARS Coronavirus 2: NEGATIVE

## 2020-11-16 LAB — PREPARE RBC (CROSSMATCH)

## 2020-11-16 LAB — MRSA NEXT GEN BY PCR, NASAL: MRSA by PCR Next Gen: DETECTED — AB

## 2020-11-16 MED ORDER — FERROUS SULFATE 325 (65 FE) MG PO TABS
325.0000 mg | ORAL_TABLET | Freq: Every day | ORAL | Status: DC
  Administered 2020-11-17 – 2020-11-22 (×6): 325 mg via ORAL
  Filled 2020-11-16 (×6): qty 1

## 2020-11-16 MED ORDER — SODIUM CHLORIDE 0.9% IV SOLUTION
Freq: Once | INTRAVENOUS | Status: DC
Start: 2020-11-16 — End: 2020-11-22

## 2020-11-16 MED ORDER — DEXAMETHASONE 0.5 MG PO TABS
0.5000 mg | ORAL_TABLET | Freq: Every day | ORAL | Status: DC
  Administered 2020-11-16 – 2020-11-22 (×7): 0.5 mg via ORAL
  Filled 2020-11-16 (×7): qty 1

## 2020-11-16 MED ORDER — METOPROLOL TARTRATE 25 MG PO TABS
25.0000 mg | ORAL_TABLET | Freq: Two times a day (BID) | ORAL | Status: DC
  Filled 2020-11-16: qty 1

## 2020-11-16 MED ORDER — PERFLUTREN LIPID MICROSPHERE
1.0000 mL | INTRAVENOUS | Status: AC | PRN
  Administered 2020-11-16: 2 mL via INTRAVENOUS
  Filled 2020-11-16: qty 10

## 2020-11-16 MED ORDER — FUROSEMIDE 10 MG/ML IJ SOLN
20.0000 mg | Freq: Once | INTRAMUSCULAR | Status: AC
  Administered 2020-11-16: 20 mg via INTRAVENOUS
  Filled 2020-11-16 (×2): qty 2

## 2020-11-16 MED ORDER — SODIUM CHLORIDE 0.9 % IV BOLUS
1000.0000 mL | Freq: Once | INTRAVENOUS | Status: AC
  Administered 2020-11-16: 1000 mL via INTRAVENOUS

## 2020-11-16 MED ORDER — LEVOTHYROXINE SODIUM 75 MCG PO TABS
175.0000 ug | ORAL_TABLET | Freq: Every day | ORAL | Status: DC
  Administered 2020-11-16 – 2020-11-19 (×4): 175 ug via ORAL
  Filled 2020-11-16 (×4): qty 1

## 2020-11-16 MED ORDER — DAPAGLIFLOZIN PROPANEDIOL 10 MG PO TABS
10.0000 mg | ORAL_TABLET | Freq: Every day | ORAL | Status: DC
  Administered 2020-11-16 – 2020-11-22 (×6): 10 mg via ORAL
  Filled 2020-11-16 (×6): qty 1

## 2020-11-16 MED ORDER — SODIUM CHLORIDE 0.9 % IV SOLN: INTRAVENOUS | Status: AC

## 2020-11-16 MED ORDER — ATORVASTATIN CALCIUM 40 MG PO TABS
40.0000 mg | ORAL_TABLET | Freq: Every day | ORAL | Status: DC
  Administered 2020-11-16 – 2020-11-21 (×6): 40 mg via ORAL
  Filled 2020-11-16 (×6): qty 1

## 2020-11-16 NOTE — Progress Notes (Signed)
   11/15/20 2050  Vitals  BP (!) 84/41  MAP (mmHg) (!) 55  BP Location Right Arm  BP Method Automatic  Patient Position (if appropriate) Lying  Pulse Rate 94  Pulse Rate Source Monitor  ECG Heart Rate 93  Resp 20  Level of Consciousness  Level of Consciousness Alert  MEWS COLOR  MEWS Score Color Green  Oxygen Therapy  SpO2 99 %  O2 Device Room Air  Pain Assessment  Pain Scale 0-10  Pain Score 0  MEWS Score  MEWS Temp 0  MEWS Systolic 1  MEWS Pulse 0  MEWS RR 0  MEWS LOC 0  MEWS Score 1  Rapid Response Notification  Name of Rapid Response RN Notified Mindy rn  Date Rapid Response Notified 11/15/20  Time Rapid Response Notified 2055  Patient called me on my phone and told me 'I am so hot." I was in another room and sent Butch Penny R.N. into assess her. See above V.S.

## 2020-11-16 NOTE — Progress Notes (Signed)
Elevated troponin of 396.  MD aware.

## 2020-11-16 NOTE — Progress Notes (Signed)
Physician reviewed CTA and reports that it looks negative, wants to do a cardiac w/u.  Plan to get EKG, troponins and possible cardiology consult. Will ctm.

## 2020-11-16 NOTE — Consult Note (Addendum)
Cardiology Consultation:   Patient ID: Tammy Good MRN: [id number]; DOB: 05/21/1950  Admit date: 11/15/2020 Date of Consult: 11/16/2020  PCP:  [name], MD   [facility] Providers Cardiologist:  Glenetta Hew, MD         Patient Profile:   Tammy Good is a 70 y.o. female with a hx of STEMI 2015, with stent to RCA, PVCS, htn, hld, dm-2 , PAD who is being seen 11/16/2020 for the evaluation of atrial fib and hypotension at the request of Dr, Carlis Abbott.  History of Present Illness:   Tammy Good with hx of STEMI inf wall in 2015 and shock with PCI to RCA and prox Rt post lateral branch. She has done well since that time. Echo in 2015 with EF 50-55% G1DD,  she is on statin, ASA and plavix, and farxiga for DM-2 along with insulin.  On ACE for HTN, also on Iron, and levothyroxine.     She was admitted 11/15/20 by [facility] team for possible Rt leg venous reflux and non healing wound that began in June this year.  Prior PV procedure 05/2019 with Lt SFA atherectomy with PTA and stent, and patent popliteal artery with 3 vessel runoff.  Yesterday had transfemoral access with Rt SFA stenting- in Lt lower ext. ABI on on Rt 0.5 with a marginal toe pressure in the 60s.  Later in day she was diaphoretic and thought hypoglycemic but glucose 166.  Her BP was 91/41 R 25.  She was lethargic and dyspneic.   She was given IV fluids of 500 cc and improved.  Groin was stable.  BP dropped again and rec'd another 500 cc NS. Hgb did drop form 10.6 to 9.2 and now 8.9.  Hs troponin today 396 and second one pending  EKG:  The EKG was personally reviewed and demonstrates:  atrial fib new at 108 has ST depression but similar to EKG in 06/2019  was in SR at 21:49 pm yesterday Telemetry:  Telemetry was personally reviewed and demonstrates:  was in SR and SR with PACs until 11 50 then in a fib.   Na 140 K+ 3.5 BUN 11, Cr 0.87  Hgb 8.9 WBC 10.4 plts 380 She has gone for CTA of abd and pelvis  BP 87/48 to 105/48 P  108 R 24  CT of abd and pelvis without retroperitoneal bleed but is down from hgb 10.6 to 8.9   Pt denies any chest pain or SOB.  She does wheeze with talking.  BP still low.  In atrial fib. Though BP low in SR.   Past Medical History:  Diagnosis Date   CAD S/P percutaneous coronary angioplasty 08/2013   100% mRCA - PCI Integrity Resolute DES 3.0 mm x 38 mm - 3.35 mm; PTCA of RPA V 2.0 mm x 15 mm   Cholesteatoma    right   Diabetes mellitus type 2 in obese (HCC)    On insulin and Invokana   History of osteomyelitis L 5th Toe all 05/2019   s/p Partial Ray Amputation with partial closure; 6 wks Abx & LSFA Atherectomy/DEB PTA with Stent for focal dissection.   Hyperlipidemia with target LDL less than 70    Hypothyroidism (acquired)    Mild essential hypertension    Obesity (BMI 30-39.9) 11/17/2013   PAD (peripheral artery disease) ([place]) 05/26/2019   05/26/19: Abd AoGram- BLE runoff -> L SFA orbital atherectomy - PTA w/ DEB & Stent 6 x 40 Luttonix (for focal dissection) - patent  Pop A with 3 V runoff. LEA Dopplers 01/03/2020: RABI (prev) 0.68 (0.53)/ RTBI (prev) 0.58 (0.33); LABI (prev) 0.80 (0.64), LTBI (prev) 0.64 (0.51); R mSFA ~50-74%, L mSFA 50-74%. Patent Prox SFA stent < 49% stenosis   Panhypopituitarism (HCC)    ST elevation myocardial infarction (STEMI) of inferior wall, subsequent episode of care ([place]) 08/2013   80% branch of D1, 40% mid AV groove circumflex, 100% RCA with subacute thrombus -- thrombus extending into RPA V with 100% occlusion after initial angioplasty of mid RCA ;; Post MI ECHO 6/9/'15: EF 50-55%, mild LVH with moderate HK of inferior wall, Gr1 DD, mild LA dilation; mildly reduced RV function    Past Surgical History:  Procedure Laterality Date   ABDOMINAL AORTOGRAM W/LOWER EXTREMITY N/A 05/26/2019   Procedure: ABDOMINAL AORTOGRAM W/LOWER EXTREMITY;  Surgeon: Marty Heck, MD;  Location: [place] [facility];  Service: Cardiovascular;  Laterality: N/A;   ABDOMINAL  AORTOGRAM W/LOWER EXTREMITY N/A 11/15/2020   Procedure: ABDOMINAL AORTOGRAM W/LOWER EXTREMITY;  Surgeon: Marty Heck, MD;  Location: [place] [facility];  Service: Cardiovascular;  Laterality: N/A;   AMPUTATION Left 05/25/2019   Procedure: AMPUTATION RAY 5th;  Surgeon: Trula Slade, DPM;  Location: [place];  Service: Podiatry;  Laterality: Left;   Cardiac Event Monitor  July-August 2015   Sinus rhythm with PVCs   COLONOSCOPY N/A 08/31/2013   Procedure: COLONOSCOPY;  Surgeon: Juanita Craver, MD;  Location: [facility] ENDOSCOPY;  Service: Endoscopy;  Laterality: N/A;   ESOPHAGOGASTRODUODENOSCOPY N/A 09/01/2013   Procedure: ESOPHAGOGASTRODUODENOSCOPY (EGD);  Surgeon: Beryle Beams, MD;  Location: [facility] ENDOSCOPY;  Service: Endoscopy;  Laterality: N/A;  bedside   LEFT HEART CATHETERIZATION WITH CORONARY ANGIOGRAM N/A 08/30/2013   Procedure: LEFT HEART CATHETERIZATION WITH CORONARY ANGIOGRAM;  Surgeon: Leonie Man, MD;  Location: [facility] CATH LAB: 100% mRCA (thrombus - extends to RPAV), 80% D1, 40% AVG Cx.   PERCUTANEOUS CORONARY STENT INTERVENTION (PCI-S)  08/30/2013   Procedure: PERCUTANEOUS CORONARY STENT INTERVENTION (PCI-S);  Surgeon: Leonie Man, MD;  Location: [facility] CATH LAB;  Integrity Resolute DES 2.0 mm x 38 mm -- 3.35 mm.; PTCA of proximal RPA V. - 3.0 mm x 15 mm balloon   PERIPHERAL VASCULAR ATHERECTOMY  05/26/2019   Procedure: PERIPHERAL VASCULAR ATHERECTOMY;  Surgeon: Marty Heck, MD;  Location: [place] [facility];  Service: Cardiovascular;;  Left SFA   PERIPHERAL VASCULAR INTERVENTION  05/26/2019   Procedure: PERIPHERAL VASCULAR INTERVENTION;  Surgeon: Marty Heck, MD;  Location: [place] [facility];  Service: Cardiovascular;;  Left SFA   PERIPHERAL VASCULAR INTERVENTION Right 11/15/2020   Procedure: PERIPHERAL VASCULAR INTERVENTION;  Surgeon: Marty Heck, MD;  Location: [place] [facility];  Service: Cardiovascular;  Laterality: Right;  Superficial Femoral Artery    PITUITARY SURGERY     TRANSTHORACIC ECHOCARDIOGRAM  08/30/2013   mild LVH. EF 50-55%. Moderate HK of the entire inferior myocardium. GR 1 DD. Mild LA dilation. Mildly reduced RV function   TYMPANOMASTOIDECTOMY Right 12/28/2017   Procedure: RIGHT TYMPANOMASTOIDECTOMY;  Surgeon: Leta Baptist, MD;  Location: [place];  Service: ENT;  Laterality: Right;     Home Medications:  Prior to Admission medications   Medication Sig Start Date End Date Taking? Authorizing Provider  aspirin EC 81 MG tablet Take 81 mg by mouth daily.   Yes [provider]  atorvastatin (LIPITOR) 40 MG tablet Take 1 tablet (40 mg total) by mouth daily at 6 PM. 09/03/13  Yes McClung,  Kimberlee Nearing, MD  B-D UF III MINI PEN NEEDLES 31G X 5 MM MISC Inject into the skin. 05/28/20  Yes [provider]  clopidogrel (PLAVIX) 75 MG tablet Take 1 tablet by mouth once daily with breakfast 10/25/20  Yes Marty Heck, MD  Continuous Blood Gluc Sensor (FREESTYLE LIBRE 2 SENSOR) MISC Apply topically every 14 (fourteen) days. 06/12/20  Yes [provider]  dexamethasone (DECADRON) 0.5 MG tablet Take 0.5 mg by mouth daily. 09/03/13  Yes Cherene Altes, MD  FARXIGA 10 MG TABS tablet Take 10 mg by mouth daily.  07/25/15  Yes [provider]  ferrous sulfate 325 (65 FE) MG tablet Take 325 mg by mouth daily with breakfast.   Yes [provider]  Insulin Glargine (BASAGLAR KWIKPEN) 100 UNIT/ML SOPN Inject 48 Units into the skin at bedtime. 07/11/15  Yes [provider]  levothyroxine (SYNTHROID, LEVOTHROID) 175 MCG tablet Take 175 mcg by mouth daily before breakfast.   Yes [provider]  lisinopril (ZESTRIL) 30 MG tablet Take 15 mg by mouth daily. 11/02/20  Yes [provider]  metoprolol tartrate (LOPRESSOR) 25 MG tablet TAKE 1 TABLET TWICE A DAY 02/04/17  Yes Leonie Man, MD  NOVOLOG FLEXPEN 100 UNIT/ML FlexPen Inject 5-15 Units into the skin 3 (three)  times daily with meals. Per sliding scale 01/03/19  Yes [provider]  nystatin cream (MYCOSTATIN) Apply 1 application topically 2 (two) times daily. Patient taking differently: Apply 1 application topically 2 (two) times daily as needed (rash). 08/29/19  Yes Carlyle Basques, MD  Omega-3 Fatty Acids (FISH OIL) 1000 MG CAPS Take 1,000 mg by mouth daily.   Yes [provider]  ONETOUCH VERIO test strip  12/24/17  Yes [provider]  pantoprazole (PROTONIX) 40 MG tablet Take 1 tablet (40 mg total) by mouth 2 (two) times daily before a meal. 09/03/13  Yes Cherene Altes, MD  Propylene Glycol (SYSTANE COMPLETE) 0.6 % SOLN Place 1 drop into both eyes daily as needed (dry eyes).   Yes [provider]  silver sulfADIAZINE (SILVADENE) 1 % cream Apply 1 application topically daily. Apply thin layer to leg wound daily 11/08/20  Yes Persons, Bevely Palmer, PA  lisinopril (ZESTRIL) 10 MG tablet Take 1.5 tablets (15 mg total) by mouth daily. Patient not taking: Reported on 11/08/2020 11/02/20   Leonie Man, MD    Inpatient Medications: Scheduled Meds:  aspirin EC  81 mg Oral Daily   atorvastatin  40 mg Oral q1800   clopidogrel  75 mg Oral Q breakfast   dapagliflozin propanediol  10 mg Oral Daily   dexamethasone  0.5 mg Oral Daily   [START ON 11/17/2020] ferrous sulfate  325 mg Oral Q breakfast   insulin aspart  0-15 Units Subcutaneous TID WC   insulin aspart  0-5 Units Subcutaneous QHS   levothyroxine  175 mcg Oral QAC breakfast   sodium chloride flush  3 mL Intravenous Q12H   Continuous Infusions:  sodium chloride     sodium chloride 75 mL/hr at 11/16/20 1444   lactated ringers Stopped (11/15/20 2200)   PRN Meds: sodium chloride, acetaminophen, hydrALAZINE, labetalol, ondansetron (ZOFRAN) IV, sodium chloride flush  Allergies:    Allergies  Allergen Reactions   Strawberry Extract Itching, Swelling and Anaphylaxis    Mouth swells and gets itchy    Social  History:   Social History   Socioeconomic History   Marital status: Widowed    Spouse name: Not  on file   Number of children: Not on file   Years of education: Not on file   Highest education level: Not on file  Occupational History   Not on file  Tobacco Use   Smoking status: Former   Smokeless tobacco: Never  Substance and Sexual Activity   Alcohol use: No   Drug use: No   Sexual activity: Not Currently    Birth control/protection: Post-menopausal  Other Topics Concern   Not on file  Social History Narrative   Widow. Works at [employer].   Former smoker.   Overall not very active.  Hoping to get into water aerobics class.   Social Determinants of Health   Financial Resource Strain: Not on file  Food Insecurity: Not on file  Transportation Needs: Not on file  Physical Activity: Not on file  Stress: Not on file  Social Connections: Not on file  Intimate Partner Violence: Not on file    Family History:   Family History  Problem Relation Age of Onset   Cancer Mother 90       multiple myeloma   Heart attack Father 74   Cancer Sister    Alzheimer's disease Maternal Grandmother      ROS:  Please see the history of present illness.  General:no colds or fevers, no weight changes Skin:no rashes or ulcers HEENT:no blurred vision, no congestion CV:see HPI PUL:see HPI GI:no diarrhea constipation or melena, no indigestion GU:no hematuria, no dysuria MS:no joint pain, no claudication Neuro:no syncope, no lightheadedness Endo:+ diabetes, + thyroid disease  All other ROS reviewed and negative.     Physical Exam/Data:   Vitals:   11/16/20 1335 11/16/20 1340 11/16/20 1400 11/16/20 1430  BP: (!) 100/41 (!) 97/51 (!) 100/59 (!) 87/48  Pulse: 99  61 (!) 108  Resp: (!) 24 (!) 22 (!) 24   Temp:   98.9 F (37.2 C)   TempSrc:   Oral   SpO2: 96%  99%   Weight:      Height:        Intake/Output Summary (Last 24 hours) at 11/16/2020 1548 Last data filed at  11/16/2020 1500 Gross per 24 hour  Intake 947.52 ml  Output 1950 ml  Net -1002.48 ml   Last 3 Weights 11/15/2020 11/06/2020 10/22/2020  Weight (lbs) 230 lb 232 lb 12.8 oz 232 lb  Weight (kg) 104.327 kg 105.597 kg 105.235 kg     Body mass index is 38.27 kg/m.  General:  Well nourished, well developed, in no acute distress but sleepy HEENT: normal Lymph: no adenopathy Neck: no JVD Endocrine:  No thryomegaly Vascular: No carotid bruits; ? pulses   Cardiac:  irreg irreg; RRR; no murmur gallup rub or click Lungs:  upper airway wheezing no harsh rales to auscultation bilaterally,  Abd: soft, nontender, no hepatomegaly  Ext: no edema, Lt groin with soft but tender hematoma  Musculoskeletal:  No deformities, BUE and BLE strength normal and equal Skin: warm and dry  Neuro:  alert but falls to sleep easily, no focal abnormalities noted Psych:  Normal affect    Relevant CV Studies: Last echo in 2015, will recheck   Cardiac cath 08/30/13  Coronary Anatomy: Right Dominant Left Main: Moderate caliber vessel that bifurcates into the LAD and Circumflex, mild calcification, but otherwise angiographically normal. LAD: Moderate caliber, somewhat tortuous vessel it tapers into a relatively small vessel none the apex. There is several septal perforators with one major diagonal branch. The diagonal  branch comes in the mid vessel and has 2 major branches. The inferior branch has a roughly 80% stenosis in a less than 2 mm vessel. The LAD itself has mild diffuse disease.   Left Circumflex: Moderate caliber vessel is extremely tortuous in nature. It gives rise to a small AV groove branch that essentially courses as a lateral OM. In the early mid AV groove is roughly 40% stenosis prior to the branch point to AV groove and OM   RCA: Large-caliber, likely dominant vessel that is 100% occluded just after a first atrial branch (early mid). The SA nodal artery appears to be occluded. The lesion appears subacute with  appears to be old thrombus. Beyond the initial occlusion there is an extensive area of diffuse disease roughly 30-35 mm, Post angioplasty angiography revealed a small but extensive right posterior descending artery and Right Posterior AV Groove Branch (RPAV) that gives off 2 small  RPL branches. The inferior/first branch is the occluded at the ostium with what appears to be an embolic thrombus.   After reviewing the initial angiography, the culprit lesion was thought to be the 100% thrombotic occluded early mid RCA.  Preparation were made to proceed with PCI on this lesion.   Percutaneous Coronary Intervention:  Sheath exchanged for 6 Fr  Lesion #1: 100% thrombotic occluded early mid RCA.  -- Reduced to 0% stenosis.             TIMI 0 flow pre-PCI -- TIMI-3 flow post PCI   Guide: 6 Fr   JR 4       Guidewire: Prowater - wire passed somewhat easily into the mid portion of the vessel, but was more difficult to get down beyond the bifurcation distally. Predilation Balloon: Euphora 2.0 mm x 15 mm; multiple inflations 10 Atm x 30 Sec - throughout the entire mid RCA Stent: Integrity Resolute 3.0 mm x 38 mm;  Max inflation: 16 Atm x 45 Sec, - final diameter 3.35 mm Following stent placement, the posterior lateral system continue to be diffusely occluded at what is likely embolic thrombus.  At this point, Aggrastat bolus and infusion was administered. This is was made to proceed with balloon angioplasty of the posterior AV groove branch that appeared to be 100% occluded.   Lesion #2 proximal RPAV 170% thrombotic/embolic occlusion   Predilation Balloon: Euphora 2.0 mm x 15 mm; multiple inflations 6 Atm x 30 Sec x 2 inflations   Post deployment angiography in multiple views, with and without guidewire in place revealed excellent stent deployment and lesion coverage.  There was no evidence of dissection or perforation. One of 2 posterolateral branches had restore flow while the other appeared to be occluded  with thrombus.     POST-OPERATIVE DIAGNOSIS:   Severe single-vessel CAD with 100% occluded early mid RCA (with extensive disease beyond the initial occlusion) Successful PTCA-PCI of the mid RCA using An Integrity Resolute DES stent 3.0 mm x 38 mm Successful PTCA of the Proximal Right Posterior Lateral Branch    Laboratory Data:  High Sensitivity Troponin:   Recent Labs  Lab 11/16/20 1016  TROPONINIHS 396*     Chemistry Recent Labs  Lab 11/15/20 1128 11/16/20 1016  NA 143 140  K 3.5 3.5  CL 104 110  CO2  --  23  GLUCOSE 128* 151*  BUN 12 11  CREATININE 0.80 0.87  CALCIUM  --  7.8*  GFRNONAA  --  >60  ANIONGAP  --  7    No  results for input(s): PROT, ALBUMIN, AST, ALT, ALKPHOS, BILITOT in the last 168 hours. Hematology Recent Labs  Lab 11/15/20 1644 11/16/20 0428 11/16/20 1016  WBC 10.4 10.8* 10.4  RBC 3.78* 3.38* 3.15*  HGB 10.6* 9.2* 8.9*  HCT 35.0* 31.6* 29.2*  MCV 92.6 93.5 92.7  MCH 28.0 27.2 28.3  MCHC 30.3 29.1* 30.5  RDW 16.6* 16.7* 16.6*  PLT 436* 408* 380   BNPNo results for input(s): BNP, PROBNP in the last 168 hours.  DDimer No results for input(s): DDIMER in the last 168 hours.   Radiology/Studies:  PERIPHERAL VASCULAR CATHETERIZATION  Result Date: 11/15/2020 Patient name: KANASIA GAYMAN   MRN: [id number]        DOB: 01/23/1951            Sex: female  11/15/2020 Pre-operative Diagnosis: Nonhealing right foot wound with severe peripheral arterial disease Post-operative diagnosis:  Same Surgeon:  Marty Heck, MD Procedure Performed: 1.  Ultrasound-guided access left common femoral artery 2.  Aortogram including catheter selection of aorta 3.  Right lower extremity arteriogram with selection of third order branches 4.  Right mid to distal SFA and above-knee popliteal artery angioplasty with stent placement (predilated with 3 mm Mustang, stented with 6 mm x 150 mm drug-coated Eluvia x2, postdilated with a 5 mm Mustang) 5.  Mynx closure of the  left common femoral artery 6.  68 minutes of monitored moderate conscious sedation time  Indications: Patient is a 70 year old female well-known to vascular surgery who has previously undergone left leg intervention for tissue loss.  She presented with nonhealing right calf wound with known peripheral arterial disease.  She presents today for lower extremity arteriogram and possible intervention after risks and benefits discussed.  Findings:  Aortogram showed no flow-limiting stenosis in the aortoiliac segment.  Right lower extremity arteriogram showed patent common femoral profunda with a diffusely diseased mid to distal SFA with multifocal high-grade stenosis greater than 70% with heavy calcification as well as a focal high-grade heavy calcification in the above-knee popliteal artery greater than 80%.  The below-knee popliteal artery was patent with three-vessel tibial runoff.  Ultimately the right lower extremity SFA and above-knee popliteal lesions were crossed.  I elected to primarily stent these but could not get the stent to track and had to predilate with a 3 mm Mustang.  I then placed two 6 mm x 150 mm drug-coated Eluvia's ll postdilated with 5 mm balloons.  Excellent results with no residual stenosis and widely patent stents.  Preserved three vessel runoff.             Procedure:  The patient was identified in the holding area and taken to room 8.  The patient was then placed supine on the table and prepped and draped in the usual sterile fashion.  A time out was called.  Ultrasound was used to evaluate the left common femoral artery.  It was patent .  A digital ultrasound image was acquired.  A micropuncture needle was used to access the left common femoral artery under ultrasound guidance.  An 018 wire was advanced without resistance and a micropuncture sheath was placed.  The 018 wire was removed and a benson wire was placed.  The micropuncture sheath was exchanged for a 5 french sheath.  An omniflush  catheter was advanced over the wire to the level of L-1.  An abdominal angiogram was obtained.  Next, using the omniflush catheter and a benson wire, the aortic bifurcation was crossed and  the catheter was placed into theright external iliac artery and right runoff was obtained.  Ultimately elected to intervene.  I then used a Glidewire advantage and got my wire down the profunda.  A long 6 [place] Ansell sheath was placed in the left groin over the aortic bifurcation.  The patient was given 100 units/kg IV heparin.  I then used a KMP catheter to cannulate the SFA and got my wire down the SFA above-knee popliteal artery stenosis into the below-knee popliteal artery across all the high-grade stenosis.  We then elected to primarily stent in the above-knee popliteal artery up to the SFA disease.  Attempted to place a long Eluvia stent but would not track through the SFA.  I then had to predilate this with a 3 mm x 80 mm Mustang.  I then got my stent in the above-knee popliteal artery to pull it up into the distal SFA with a 6 mm x 150 mm drug-coated Eluvia postdilated with a 5 mm balloon.  I then placed a second stent with a 6 mm x 150 mm Eluvia again postdilated with a 5 mm balloon.  Excellent results with widely patent stent with no residual stenosis.  Preserved runoff in the popliteal artery distal to the stents with three-vessel runoff.  Wires and catheters removed.  A left groin access shot was obtained and a mynx closure device was deployed.   Plan: Patient will continue dual endplate therapy with aspirin Plavix.  We will arrange follow-up in 1 month with non-invasive imaging.  She is optimized for wound healing.  Marty Heck, MD [facility] Office: [phone]      Assessment and Plan:   Hypotensive post PV procedure, with drop in Hgb was in SR with initial hypotensive episode has rec'd total of 100 ml of fluid bolus and fluids at 74 per hour.  I&O is neg 1002 ml\ ,   CT of abd and pelvis without, retroperitoneal bleed though drop in Hgb. She has not had chest pain no SOB,  no acute ST changes on EKG, though troponin was elevated at 396 will recheck.  Occ drop in sp02 but mostly 94 %.  Will check echo as well.  Respirations elevated concern for ongoing fluid, will check CXR and BNP   She is warm and dry currently.     Acute blood loss anemia with hematoma of Lt groin transfuse for now,  Atrial fib new with mild RVR,  may be from drop in BP - again check echo.  Her CHA2DS2VASc is 5 so would need anticoagulation but with drop in Hgb will defer to MD.   Her BB is on hold due to hypotension.  Elevated troponin possible from hypotension and demand ischemia but with CAD with hx of stents to RCA, concern for MI.  No chest pain.Second troponin pending Hx HTN on zestril and lopressor hold for now with hypotension.  PAD with ransfemoral access with Rt SFA stenting.yesterday for non healing wound on Rt.  Per [name] HLD on statin followed by PCP continue Hypothyroid on levothyroxine per primary  DM-2 per primary team    Risk Assessment/Risk Scores:     TIMI Risk Score for Unstable Angina or Non-ST Elevation MI:   The patient's TIMI risk score is  , which indicates a  % risk of all cause mortality, new or recurrent myocardial infarction or need for urgent revascularization in the next 14 days.  New York Heart Association (NYHA) Functional Class  no current CHF though waiting for CXR.  NYHA Class I  CHA2DS2-VASc Score = 5  This indicates a 7.2% annual risk of stroke. The patient's score is based upon: CHF History: No HTN History: Yes Diabetes History: Yes Stroke History: No Vascular Disease History: Yes Age Score: 1 Gender Score: 1        For questions or updates, please contact [place] Please consult [email or web address] for contact info under    Signed, Cecilie Kicks, NP  11/16/2020 3:48 PM  Personally seen and examined. Agree with APP above with the  following comments: Briefly 70 yo F with a history of CAD, prior PCI RCA, HTN with HLD, and DM, and PAD who presents with new hypotension.   Patient notes that she has hx of R leg reflux and non heading ulcer.  Planned intervention 11/15/20:  Did well and was peri-discharge.  This AM Exam notable for hypotension but fluid responsive in nature.  No chest pain, chest pressure, SOB or DOE during prior evaluations she was lethargic with lower blood pressure. Had CT performed without evidence of RV bleed.  Troponin 396-> pending -EKG and telemetry noted for new AF.  No palpitations. MAP- 60. Labs notable for Hgb 12.9-> 10.8-> 9 to -> 8.   Personally reviewed relevant tests; Beside Echo notable for unable to be obtained secondary to habitus Would recommend  - DAPT for SFA CPI 11/15/20 - consented for 2 unit pRBCs and 20 IV lasix in between - CXR pending - Stat echo attempting unless bedside is available - if MAP < 65 despite interventions, patient may need higher level of care. - discussed with patient, nursing, and Rapid team; we will confer with [name] as well.  CRITICAL CARE Performed by: Meerab Maselli A Shaheen Mende  Total critical care time: 60 minutes. Critical care time was exclusive of separately billable procedures and treating other patients. Critical care was necessary to treat or prevent imminent or life-threatening deterioration. Critical care was time spent personally by me on the following activities: development of treatment plan with patient and/or surrogate as well as nursing, discussions with consultants, evaluation of patient's response to treatment, examination of patient, obtaining history from patient or surrogate, ordering and performing treatments and interventions, ordering and review of laboratory studies, ordering and review of radiographic studies, pulse oximetry and re-evaluation of patient's condition.    Signed, Rudean Haskell, [place]   11/16/2020 4:45 PM

## 2020-11-16 NOTE — Progress Notes (Signed)
   11/15/20 2053 11/15/20 2109  Vitals  BP (!) 88/46 (!) 92/43  MAP (mmHg)  --  (!) 57  BP Location  --  Right Arm  BP Method  --  Automatic  Patient Position (if appropriate)  --  Lying  Pulse Rate 90 82  Pulse Rate Source  --  Monitor  ECG Heart Rate 91 84  Resp 20 17  Level of Consciousness  Level of Consciousness Alert Alert  MEWS COLOR  MEWS Score Color Green Green  MEWS Score  MEWS Temp 0 0  MEWS Systolic 1 1  MEWS Pulse 0 0  MEWS RR 0 0  MEWS LOC 0 0  MEWS Score 1 1  21:09 B.P. coming up after bolus and I. V. Infusing. Patient feeling better not hot or restless. No pain. Freq assessment and V.S. done Mindy R>N. Stayed with patient for a while to monitor her.

## 2020-11-16 NOTE — Consult Note (Addendum)
WOC Nurse Consult Note: Reason for Consult: RIght LE full thickness wound in patient with known PAD.  S/P angioplasty on 8/25 with [name].  Followed for this wound by [name] as an outpatient. Last appointment was 11/08/20 and she is to RTC in 2 weeks. Wound type: Full thickness Pressure Injury POA: N/A Measurement: Per Nursing Flow Sheet: 5cm x 2cm x 0.5cm Wound bed: 100% red Drainage (amount, consistency, odor) serous Periwound: dry Dressing procedure/placement/frequency: While in house, I have provided Nursing with guidance for use of an antimicrobial nonadherent (Xeroform gauze) to be used as a wound contact layer with daily changes following soap and water cleanse. Post discharge, patient to resume daily care as [name] has directed, using silvadene cream (silver sulfadiazine) once daily.  PAtient to follow up as directed with both Vascular Surgery and [name].  [place] nursing team will not follow, but will remain available to this patient, the nursing and medical teams.  Please re-consult if needed. Thanks, Maudie Flakes, MSN, RN, [place], Arther Abbott  Pager# [phone]

## 2020-11-16 NOTE — Progress Notes (Signed)
  Echocardiogram 2D Echocardiogram with contrast has been performed.  Merrie Roof F 11/16/2020, 6:02 PM

## 2020-11-16 NOTE — Progress Notes (Signed)
I have reviewed her CTA and no evidence of RP bleed or significant left groin bleed.  BP remains soft.  EKG raises question or new onset afib with RVR.  Troponin's slightly elevated and hx of STEMI.  Have consulted cardiology.  Appreciate their assistance.  Marty Heck, MD [facility] Office: [employer]

## 2020-11-16 NOTE — Progress Notes (Signed)
Pt echo with decrease in EF and WMA present.  Difficult to do cath at this time with hypotension and recent bleed.  Will continue to monitor. Further decision on cath once her Hgb has improved. Hs troponin now 801. BNP elevated and her IV fluids have infused and she will receive lasix 20 mg IV between units.  TSH <0.010  will need reeval of synthroid in AM BP is improving though occ low BP.  With blood this should increase.

## 2020-11-16 NOTE — Progress Notes (Addendum)
[facility]  Subjective  -had another episode of hypotension overnight.  Feels better this morning.   Objective 90/64 (!) 109 98.4 F (36.9 C) (Oral) 14 94%  Intake/Output Summary (Last 24 hours) at 11/16/2020 0950 Last data filed at 11/16/2020 0651 Gross per 24 hour  Intake 620 ml  Output 750 ml  Net -130 ml    Left groin with soft hematoma mostly in the thigh Brisk DP signals bilaterally  Laboratory Lab Results: Recent Labs    11/15/20 1644 11/16/20 0428  WBC 10.4 10.8*  HGB 10.6* 9.2*  HCT 35.0* 31.6*  PLT 436* 408*   BMET Recent Labs    11/15/20 1128  NA 143  K 3.5  CL 104  GLUCOSE 128*  BUN 12  CREATININE 0.80    COAG Lab Results  Component Value Date   INR 1.1 05/26/2019   INR 1.22 08/29/2013   No results found for: PTT  Assessment/Planning:  Postop day 1 status post left transfemoral access with right SFA stenting.  Had an episode of hypotension in recovery yesterday after Mynx closure.  Admitted overnight for observation.  Hemoglobin this morning 10.6 ---> 9.2.  Hemodynamics were better this morning.  We will keep her another day.  Advance to regular diet.  On aspirin Plavix statin.  We will check another CBC this evening.  Marty Heck 11/16/2020 9:50 AM --

## 2020-11-16 NOTE — Progress Notes (Signed)
Notified [name] of patients low Bps orders received for a 1 time liter bolus, CTA, and CBC stat.  Will update provider once completed.

## 2020-11-16 NOTE — Progress Notes (Signed)
   11/15/20 2053  Vitals  Temp 97.9 F (36.6 C)  Temp Source Oral  BP (!) 88/46  MAP (mmHg) (!) 59  BP Location Right Arm  BP Method Automatic  Patient Position (if appropriate) Lying  Pulse Rate 90  Pulse Rate Source Monitor  ECG Heart Rate 91  Resp 20  Level of Consciousness  Level of Consciousness Alert  MEWS COLOR  MEWS Score Color Green  Oxygen Therapy  SpO2 99 %  O2 Device Room Air  Pain Assessment  Pain Scale 0-10  Pain Score 0  MEWS Score  MEWS Temp 0  MEWS Systolic 1  MEWS Pulse 0  MEWS RR 0  MEWS LOC 0  MEWS Score 1  Provider Notification  Provider Name/Title [facility]  Date Provider Notified 11/15/20  Time Provider Notified 2055  Notification Type Call  Notification Reason Change in status  Provider response See new orders  Date of Provider Response 11/15/20  Time of Provider Response 2100  Butch Penny repeated V.S. Mindy R.N. with rapid response was called and came over to assess patient. Dr. Luan Pulling was called and made aware of V.S.and that patient was very hot and was symptomatic and restless. NO change in hematoma. Dr. Luan Pulling order I.V.fl. Mindy R.N. had order to give 500 ml N.S.  which was given

## 2020-11-17 ENCOUNTER — Encounter (HOSPITAL_COMMUNITY): Payer: Self-pay | Admitting: Vascular Surgery

## 2020-11-17 DIAGNOSIS — I739 Peripheral vascular disease, unspecified: Secondary | ICD-10-CM | POA: Diagnosis not present

## 2020-11-17 DIAGNOSIS — I214 Non-ST elevation (NSTEMI) myocardial infarction: Secondary | ICD-10-CM

## 2020-11-17 LAB — GLUCOSE, CAPILLARY
Glucose-Capillary: 137 mg/dL — ABNORMAL HIGH (ref 70–99)
Glucose-Capillary: 165 mg/dL — ABNORMAL HIGH (ref 70–99)
Glucose-Capillary: 167 mg/dL — ABNORMAL HIGH (ref 70–99)
Glucose-Capillary: 149 mg/dL — ABNORMAL HIGH (ref 70–99)

## 2020-11-17 LAB — TYPE AND SCREEN
ABO/RH(D): O POS
Antibody Screen: NEGATIVE
Unit division: 0
Unit division: 0

## 2020-11-17 LAB — CBC
HCT: 34 % — ABNORMAL LOW (ref 36.0–46.0)
Hemoglobin: 10.5 g/dL — ABNORMAL LOW (ref 12.0–15.0)
MCH: 28.3 pg (ref 26.0–34.0)
MCHC: 30.9 g/dL (ref 30.0–36.0)
MCV: 91.6 fL (ref 80.0–100.0)
Platelets: 325 10*3/uL (ref 150–400)
RBC: 3.71 MIL/uL — ABNORMAL LOW (ref 3.87–5.11)
RDW: 15.9 % — ABNORMAL HIGH (ref 11.5–15.5)
WBC: 9.2 10*3/uL (ref 4.0–10.5)
nRBC: 0 % (ref 0.0–0.2)

## 2020-11-17 LAB — BASIC METABOLIC PANEL
Anion gap: 5 (ref 5–15)
BUN: 5 mg/dL — ABNORMAL LOW (ref 8–23)
CO2: 27 mmol/L (ref 22–32)
Calcium: 8.1 mg/dL — ABNORMAL LOW (ref 8.9–10.3)
Chloride: 110 mmol/L (ref 98–111)
Creatinine, Ser: 0.9 mg/dL (ref 0.44–1.00)
GFR, Estimated: >60 mL/min (ref >60)
Glucose, Bld: 164 mg/dL — ABNORMAL HIGH (ref 70–99)
Potassium: 3.4 mmol/L — ABNORMAL LOW (ref 3.5–5.1)
Sodium: 142 mmol/L (ref 135–145)

## 2020-11-17 LAB — BPAM RBC
Blood Product Expiration Date: 202209262359
Blood Product Expiration Date: 202209262359
ISSUE DATE / TIME: 202208261843
ISSUE DATE / TIME: 202208262245
Unit Type and Rh: 5100
Unit Type and Rh: 5100

## 2020-11-17 LAB — HEPARIN LEVEL (UNFRACTIONATED): Heparin Unfractionated: <0.1 IU/mL — ABNORMAL LOW (ref 0.30–0.70)

## 2020-11-17 MED ORDER — MUPIROCIN 2 % EX OINT
1.0000 "application " | TOPICAL_OINTMENT | Freq: Two times a day (BID) | CUTANEOUS | Status: AC
  Administered 2020-11-17 – 2020-11-21 (×10): 1 via NASAL
  Filled 2020-11-17 (×2): qty 22

## 2020-11-17 MED ORDER — HEPARIN (PORCINE) 25000 UT/250ML-% IV SOLN
1500.0000 [IU]/h | INTRAVENOUS | Status: DC
  Administered 2020-11-17: 1000 [IU]/h via INTRAVENOUS
  Administered 2020-11-18: 1300 [IU]/h via INTRAVENOUS
  Filled 2020-11-17 (×3): qty 250

## 2020-11-17 MED ORDER — CHLORHEXIDINE GLUCONATE CLOTH 2 % EX PADS
6.0000 | MEDICATED_PAD | Freq: Every day | CUTANEOUS | Status: DC
Start: 2020-11-17 — End: 2020-11-21
  Administered 2020-11-17 – 2020-11-20 (×4): 6 via TOPICAL

## 2020-11-17 NOTE — Progress Notes (Signed)
ANTICOAGULATION CONSULT NOTE - Initial Consult  Pharmacy Consult for heparin Indication: chest pain/ACS and new AF  Allergies  Allergen Reactions   Strawberry Extract Itching, Swelling and Anaphylaxis    Mouth swells and gets itchy    Patient Measurements: Height: '5\' 5"'$  (165.1 cm) Weight: 104.3 kg (230 lb) IBW/kg (Calculated) : 57 Heparin Dosing Weight: 81.2 kg   Vital Signs: Temp: 99.1 F (37.3 C) (08/27 1100) Temp Source: Oral (08/27 1100) BP: 111/47 (08/27 1100) Pulse Rate: 101 (08/27 1100)  Labs: Recent Labs    11/16/20 1016 11/16/20 1418 11/16/20 1736 11/16/20 2233 11/17/20 0305  HGB 8.9*  --  8.9*  --  10.5*  HCT 29.2*  --  30.5*  --  34.0*  PLT 380  --  406*  --  325  CREATININE 0.87  --  0.94  --  0.90  TROPONINIHS 396* 276* 801* 2,073*  --     Estimated Creatinine Clearance: 71.7 mL/min (by C-G formula based on SCr of 0.9 mg/dL).   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   Hyperlipidemia with target LDL less than 70    Hypothyroidism (acquired)    Mild essential hypertension    Obesity (BMI 30-39.9) 11/17/2013   PAD (peripheral artery disease) ([place]) 05/26/2019   05/26/19: Abd AoGram- BLE runoff -> L SFA orbital atherectomy - PTA w/ DEB & Stent 6 x 40 Luttonix (for focal dissection) - patent Pop A with 3 V runoff. LEA Dopplers 01/03/2020: RABI (prev) 0.68 (0.53)/ RTBI (prev) 0.58 (0.33); LABI (prev) 0.80 (0.64), LTBI (prev) 0.64 (0.51); R mSFA ~50-74%, L mSFA 50-74%. Patent Prox SFA stent < 49% stenosis   Panhypopituitarism (HCC)    ST  elevation myocardial infarction (STEMI) of inferior wall, subsequent episode of care ([place]) 08/2013   80% branch of D1, 40% mid AV groove circumflex, 100% RCA with subacute thrombus -- thrombus extending into RPA V with 100% occlusion after initial angioplasty of mid RCA ;; Post MI ECHO 6/9/'15: EF 50-55%, mild LVH with moderate HK of inferior wall, Gr1 DD, mild LA dilation; mildly reduced RV function    Assessment: 70 year old female now postop day 2 status post right lower extremity revascularization with SFA stenting for CLI with tissue loss. Pt admitted after the procedure with a question of a thigh hematoma. CTA was obtained yesterday when she continued to drop her BP and there was no evidence of active arterial bleed or RP hematoma.   Per cardiology, attempting anticoagulation trial today in setting of post procedural NSTEMI and new AF. Provider requested no bolus be given. CBC is stable s/p PRBC x 2.  Goal of Therapy:  Heparin level 0.3-0.7 units/ml Monitor platelets by anticoagulation protocol: Yes   Plan:  Start heparin infusion at 1000 units/hr Check anti-Xa level in 6 hours and daily while on heparin Continue to monitor H&H and platelets and s/sx of bleeding  Pauletta Browns, Pharm.D. PGY-1 Ambulatory Care Resident [id number] 11/17/2020 12:11 PM

## 2020-11-17 NOTE — Evaluation (Signed)
Physical Therapy Evaluation Patient Details Name: Tammy Good MRN: [id number] DOB: 1950-10-03 Today's Date: 11/17/2020   History of Present Illness  Patient is a 70 year old female who presented to the hospital with a non-healing foot would. She had a lower extremity angiogram perfromed on 11/16/2020. PMH: CAD, ostemylitius of the left toe; Obesity; NSTMEI 2015; obesity  Clinical Impression  Patient ambulated in the room 15' but became fatigued. Her HR increased to 122 but quickly went back down to baseline. She nearly lost her balance backwards when sitting but was able to self correct. Patient reports at baseline she was getting around well. She hopes that if she is able to get out of bed and work more her endurance and mobility will increase so she will be able to go home. At this time she would benefit most from rehab, but therapy will continue to progress in the hospital as tolerated.     Follow Up Recommendations Home health PT;SNF    Equipment Recommendations  None recommended by PT    Recommendations for Other Services Rehab consult     Precautions / Restrictions Precautions Precaution Comments: Patient is on Heprin. Nursing rpeorts she is still OK to be seen for IE Restrictions Weight Bearing Restrictions: No      Mobility  Bed Mobility Overal bed mobility: Needs Assistance Bed Mobility: Sit to Supine       Sit to supine: Supervision   General bed mobility comments: Patiet found in a chair. She requuired supevision for safety to get back in the bed    Transfers Overall transfer level: Needs assistance Equipment used: Rolling walker (2 wheeled) Transfers: Sit to/from Stand Sit to Stand: Min guard         General transfer comment: gaurding for safety. When patient sat she lost her balanbce bacwards but was able to self correct.  Ambulation/Gait Ambulation/Gait assistance: Min guard Gait Distance (Feet): 15 Feet Assistive device: Rolling walker (2  wheeled) Gait Pattern/deviations: WFL(Within Functional Limits) Gait velocity: decreased   General Gait Details: Patient reported fatigued and had mild dysnesa with ambualtion in the room. HR increased to 122 with ambualtion from baseline of 103  Stairs            Wheelchair Mobility    Modified Rankin (Stroke Patients Only)       Balance Overall balance assessment: Needs assistance Sitting-balance support: No upper extremity supported;Feet supported Sitting balance-Leahy Scale: Fair Sitting balance - Comments: When patient sat she nearly lost her balance backwards   Standing balance support: Bilateral upper extremity supported Standing balance-Leahy Scale: Poor Standing balance comment: used walker for balance. Also rested on walker when needed                             Pertinent Vitals/Pain Pain Assessment: Faces Faces Pain Scale: Hurts little more Pain Location: left groin Pain Descriptors / Indicators: Aching;Constant Pain Intervention(s): Monitored during session;Limited activity within patient's tolerance;Repositioned    Home Living Family/patient expects to be discharged to:: Private residence Living Arrangements: Alone Available Help at Discharge: Family Type of Home: House Home Access: Stairs to enter   [employer] of Steps: 3          Prior Function Level of Independence: Independent         Comments: hgas a cane and walker but was not using it.     Hand Dominance   Dominant Hand: Right    Extremity/Trunk  Assessment   Upper Extremity Assessment Upper Extremity Assessment: Overall WFL for tasks assessed    Lower Extremity Assessment Lower Extremity Assessment: Generalized weakness    Cervical / Trunk Assessment Cervical / Trunk Assessment: Normal  Communication   Communication: No difficulties  Cognition Arousal/Alertness: Awake/alert Behavior During Therapy: WFL for tasks assessed/performed Overall  Cognitive Status: Within Functional Limits for tasks assessed                                        General Comments      Exercises     Assessment/Plan    PT Assessment Patient needs continued PT services  PT Problem List Decreased strength;Decreased range of motion;Decreased activity tolerance;Decreased mobility;Pain;Obesity       PT Treatment Interventions DME instruction;Gait training;Stair training;Functional mobility training;Therapeutic activities;Therapeutic exercise;Neuromuscular re-education;Patient/family education    PT Goals (Current goals can be found in the Care Plan section)  Acute Rehab PT Goals Patient Stated Goal: to go home PT Goal Formulation: With patient Time For Goal Achievement: 11/24/20 Potential to Achieve Goals: Good    Frequency Min 3X/week   Barriers to discharge        Co-evaluation               AM-PAC PT "6 Clicks" Mobility  Outcome Measure Help needed turning from your back to your side while in a flat bed without using bedrails?: A Little Help needed moving from lying on your back to sitting on the side of a flat bed without using bedrails?: A Little Help needed moving to and from a bed to a chair (including a wheelchair)?: A Little Help needed standing up from a chair using your arms (e.g., wheelchair or bedside chair)?: A Little Help needed to walk in hospital room?: A Lot Help needed climbing 3-5 steps with a railing? : A Lot 6 Click Score: 16    End of Session Equipment Utilized During Treatment: Gait belt Activity Tolerance: Patient limited by fatigue Patient left: in bed;with call bell/phone within reach;with bed alarm set   PT Visit Diagnosis: Unsteadiness on feet (R26.81);Other abnormalities of gait and mobility (R26.89);Muscle weakness (generalized) (M62.81);Pain Pain - Right/Left: Left Pain - part of body: Leg    Time: 1400-1417 PT Time Calculation (min) (ACUTE ONLY): 17 min   Charges:   PT  Evaluation $PT Eval Moderate Complexity: 1 Mod           Carney Living PT DPT  11/17/2020, 2:42 PM

## 2020-11-17 NOTE — Progress Notes (Signed)
Progress Note  Patient Name: THURZA SKURKA Date of Encounter: 11/17/2020  Primary Cardiologist: Glenetta Hew, MD   Subjective   Echo performed as part of rapid evaluation: anterolateral and inferolateral WMA.  Tenderness has improved.  No CP or SOB; just tired.  Inpatient Medications    Scheduled Meds:  sodium chloride   Intravenous Once   aspirin EC  81 mg Oral Daily   atorvastatin  40 mg Oral q1800   Chlorhexidine Gluconate Cloth  6 each Topical Q0600   clopidogrel  75 mg Oral Q breakfast   dapagliflozin propanediol  10 mg Oral Daily   dexamethasone  0.5 mg Oral Daily   ferrous sulfate  325 mg Oral Q breakfast   insulin aspart  0-15 Units Subcutaneous TID WC   insulin aspart  0-5 Units Subcutaneous QHS   levothyroxine  175 mcg Oral QAC breakfast   mupirocin ointment  1 application Nasal BID   sodium chloride flush  3 mL Intravenous Q12H   Continuous Infusions:  sodium chloride     lactated ringers Stopped (11/15/20 2200)   PRN Meds: sodium chloride, acetaminophen, hydrALAZINE, labetalol, ondansetron (ZOFRAN) IV, sodium chloride flush   Vital Signs    Vitals:   11/17/20 0300 11/17/20 0400 11/17/20 0500 11/17/20 0900  BP: (!) 103/46 (!) 117/43 (!) 107/37 124/67  Pulse: 94 93 89 90  Resp: 18 (!) '21 20 19  '$ Temp: 98.3 F (36.8 C)   98.8 F (37.1 C)  TempSrc: Oral   Oral  SpO2: 97% 98% 100% 96%  Weight:      Height:        Intake/Output Summary (Last 24 hours) at 11/17/2020 1120 Last data filed at 11/17/2020 0730 Gross per 24 hour  Intake 1789.02 ml  Output 5750 ml  Net -3960.98 ml   Filed Weights   11/15/20 0938  Weight: 104.3 kg    Telemetry    AF-> SR with one run of NSVT- Personally Reviewed  ECG    Sinus 80 anterolateral infarct pattern - Personally Reviewed  Physical Exam   GEN: No acute distress.   Neck: No JVD Cardiac: RRR, no murmurs, rubs, or gallops.  Respiratory: Wheezes most prominent in the anterior lung fields GI: Soft,  nontender, non-distended  MS: No edema; No deformity. Neuro:  Nonfocal  Psych: Normal affect   Labs    Chemistry Recent Labs  Lab 11/16/20 1016 11/16/20 1736 11/17/20 0305  NA 140 140 142  K 3.5 4.5 3.4*  CL 110 111 110  CO2 23 20* 27  GLUCOSE 151* 170* 164*  BUN 11 8 5*  CREATININE 0.87 0.94 0.90  CALCIUM 7.8* 7.7* 8.1*  GFRNONAA >60 >60 >60  ANIONGAP '7 9 5     '$ Hematology Recent Labs  Lab 11/16/20 1016 11/16/20 1736 11/17/20 0305  WBC 10.4 11.3* 9.2  RBC 3.15* 3.25* 3.71*  HGB 8.9* 8.9* 10.5*  HCT 29.2* 30.5* 34.0*  MCV 92.7 93.8 91.6  MCH 28.3 27.4 28.3  MCHC 30.5 29.2* 30.9  RDW 16.6* 16.6* 15.9*  PLT 380 406* 325    Cardiac EnzymesNo results for input(s): TROPONINI in the last 168 hours. No results for input(s): TROPIPOC in the last 168 hours.   BNP Recent Labs  Lab 11/16/20 1736  BNP 217.2*     DDimer No results for input(s): DDIMER in the last 168 hours.   Radiology    PERIPHERAL VASCULAR CATHETERIZATION  Result Date: 11/15/2020 Patient name: NIYONNA MCFATE   MRN:  [id number]        DOB: [birth year]            Sex: female  11/15/2020 Pre-operative Diagnosis: Nonhealing right foot wound with severe peripheral arterial disease Post-operative diagnosis:  Same Surgeon:  Marty Heck, MD Procedure Performed: 1.  Ultrasound-guided access left common femoral artery 2.  Aortogram including catheter selection of aorta 3.  Right lower extremity arteriogram with selection of third order branches 4.  Right mid to distal SFA and above-knee popliteal artery angioplasty with stent placement (predilated with 3 mm Mustang, stented with 6 mm x 150 mm drug-coated Eluvia x2, postdilated with a 5 mm Mustang) 5.  Mynx closure of the left common femoral artery 6.  68 minutes of monitored moderate conscious sedation time  Indications: Patient is a 70 year old female well-known to vascular surgery who has previously undergone left leg intervention for tissue loss.  She  presented with nonhealing right calf wound with known peripheral arterial disease.  She presents today for lower extremity arteriogram and possible intervention after risks and benefits discussed.  Findings:  Aortogram showed no flow-limiting stenosis in the aortoiliac segment.  Right lower extremity arteriogram showed patent common femoral profunda with a diffusely diseased mid to distal SFA with multifocal high-grade stenosis greater than 70% with heavy calcification as well as a focal high-grade heavy calcification in the above-knee popliteal artery greater than 80%.  The below-knee popliteal artery was patent with three-vessel tibial runoff.  Ultimately the right lower extremity SFA and above-knee popliteal lesions were crossed.  I elected to primarily stent these but could not get the stent to track and had to predilate with a 3 mm Mustang.  I then placed two 6 mm x 150 mm drug-coated Eluvia's ll postdilated with 5 mm balloons.  Excellent results with no residual stenosis and widely patent stents.  Preserved three vessel runoff.             Procedure:  The patient was identified in the holding area and taken to room 8.  The patient was then placed supine on the table and prepped and draped in the usual sterile fashion.  A time out was called.  Ultrasound was used to evaluate the left common femoral artery.  It was patent .  A digital ultrasound image was acquired.  A micropuncture needle was used to access the left common femoral artery under ultrasound guidance.  An 018 wire was advanced without resistance and a micropuncture sheath was placed.  The 018 wire was removed and a benson wire was placed.  The micropuncture sheath was exchanged for a 5 french sheath.  An omniflush catheter was advanced over the wire to the level of L-1.  An abdominal angiogram was obtained.  Next, using the omniflush catheter and a benson wire, the aortic bifurcation was crossed and the catheter was placed into theright external  iliac artery and right runoff was obtained.  Ultimately elected to intervene.  I then used a Glidewire advantage and got my wire down the profunda.  A long 6 [place] Ansell sheath was placed in the left groin over the aortic bifurcation.  The patient was given 100 units/kg IV heparin.  I then used a KMP catheter to cannulate the SFA and got my wire down the SFA above-knee popliteal artery stenosis into the below-knee popliteal artery across all the high-grade stenosis.  We then elected to primarily stent in the above-knee popliteal artery up to the SFA disease.  Attempted to place a long  Eluvia stent but would not track through the SFA.  I then had to predilate this with a 3 mm x 80 mm Mustang.  I then got my stent in the above-knee popliteal artery to pull it up into the distal SFA with a 6 mm x 150 mm drug-coated Eluvia postdilated with a 5 mm balloon.  I then placed a second stent with a 6 mm x 150 mm Eluvia again postdilated with a 5 mm balloon.  Excellent results with widely patent stent with no residual stenosis.  Preserved runoff in the popliteal artery distal to the stents with three-vessel runoff.  Wires and catheters removed.  A left groin access shot was obtained and a mynx closure device was deployed.   Plan: Patient will continue dual endplate therapy with aspirin Plavix.  We will arrange follow-up in 1 month with non-invasive imaging.  She is optimized for wound healing.  Marty Heck, MD [facility] Office: [phone]    DG CHEST PORT 1 VIEW  Result Date: 11/16/2020 CLINICAL DATA:  Hypotension EXAM: PORTABLE CHEST 1 VIEW COMPARISON:  08/29/2013 FINDINGS: Low lung volumes. Cardiomegaly with vascular congestion and probable small pleural effusions. Aortic atherosclerosis. No pneumothorax. Patchy left perihilar airspace disease and more focal opacity left base IMPRESSION: Cardiomegaly with vascular congestion and probable small pleural effusions. Opacity left  lung base and ground-glass opacity left perihilar region, question pneumonia Electronically Signed   By: Donavan Foil M.D.   On: 11/16/2020 19:24   ECHOCARDIOGRAM COMPLETE  Result Date: 11/16/2020    ECHOCARDIOGRAM REPORT   Patient Name:   LAKEYIA DROUIN Date of Exam: 11/16/2020 Medical Rec #:  [id number]         Height:       65.0 in Accession #:    [id number]        Weight:       230.0 lb Date of Birth:  Feb 15, 1951          BSA:          2.099 m Patient Age:    62 years          BP:           112/52 mmHg Patient Gender: F                 HR:           108 bpm. Exam Location:  Inpatient Procedure: 2D Echo, Cardiac Doppler, Color Doppler and Intracardiac            Opacification Agent Indications:    CAD Native Vessel I25.10  History:        Patient has prior history of Echocardiogram examinations, most                 recent 08/30/2013. Signs/Symptoms:Hypotension; Risk Factors:Morbid                 Obesity. Renal Disease. Anemia.  Sonographer:    Merrie Roof RDCS Referring Phys: [place]  1. Left ventricular ejection fraction, by estimation, is 40 to 45%. The left ventricle has mildly decreased function. The left ventricle demonstrates regional wall motion abnormalities (see scoring diagram/findings for description). Left ventricular diastolic parameters are consistent with Grade I diastolic dysfunction (impaired relaxation). There is severe hypokinesis of the left ventricular, entire inferolateral wall and inferior wall.  2. Right ventricular systolic function is normal. The right ventricular size is normal. Tricuspid regurgitation signal is inadequate for  assessing PA pressure.  3. Left atrial size was moderately dilated.  4. The mitral valve is grossly normal. No evidence of mitral valve regurgitation.  5. The aortic valve was not well visualized. Aortic valve regurgitation is not visualized.  6. The inferior vena cava is normal in size with greater than 50% respiratory variability,  suggesting right atrial pressure of 3 mmHg. Comparison(s): Changes from prior study are noted. 08/30/2013: LVEF 50-55%, severe inferior hypokinesis/akinesis. FINDINGS  Left Ventricle: Left ventricular ejection fraction, by estimation, is 40 to 45%. The left ventricle has mildly decreased function. The left ventricle demonstrates regional wall motion abnormalities. Severe hypokinesis of the left ventricular, entire inferolateral wall and inferior wall. Definity contrast agent was given IV to delineate the left ventricular endocardial borders. The left ventricular internal cavity size was normal in size. There is no left ventricular hypertrophy. Left ventricular diastolic parameters are consistent with Grade I diastolic dysfunction (impaired relaxation). Indeterminate filling pressures. Right Ventricle: The right ventricular size is normal. No increase in right ventricular wall thickness. Right ventricular systolic function is normal. Tricuspid regurgitation signal is inadequate for assessing PA pressure. Left Atrium: Left atrial size was moderately dilated. Right Atrium: Right atrial size was normal in size. Pericardium: There is no evidence of pericardial effusion. Mitral Valve: The mitral valve is grossly normal. No evidence of mitral valve regurgitation. Tricuspid Valve: The tricuspid valve is grossly normal. Tricuspid valve regurgitation is trivial. Aortic Valve: The aortic valve was not well visualized. Aortic valve regurgitation is not visualized. Aortic valve mean gradient measures 4.0 mmHg. Aortic valve peak gradient measures 7.6 mmHg. Aortic valve area, by VTI measures 1.83 cm. Pulmonic Valve: The pulmonic valve was normal in structure. Pulmonic valve regurgitation is not visualized. Aorta: The aortic root and ascending aorta are structurally normal, with no evidence of dilitation. Venous: The inferior vena cava is normal in size with greater than 50% respiratory variability, suggesting right atrial pressure  of 3 mmHg. IAS/Shunts: No atrial level shunt detected by color flow Doppler.  LEFT VENTRICLE PLAX 2D LVIDd:         4.30 cm  Diastology LVIDs:         3.40 cm  LV e' medial:  7.62 cm/s LV PW:         1.20 cm  LV e' lateral: 9.25 cm/s LV IVS:        1.10 cm LVOT diam:     2.10 cm LV SV:         37 LV SV Index:   18 LVOT Area:     3.46 cm  RIGHT VENTRICLE RV Basal diam:  3.70 cm RV S prime:     11.40 cm/s TAPSE (M-mode): 2.1 cm LEFT ATRIUM           Index       RIGHT ATRIUM           Index LA diam:      3.40 cm 1.62 cm/m  RA Area:     22.20 cm LA Vol (A4C): 83.4 ml 39.73 ml/m RA Volume:   64.00 ml  30.49 ml/m  AORTIC VALVE AV Area (Vmax):    2.35 cm AV Area (Vmean):   2.08 cm AV Area (VTI):     1.83 cm AV Vmax:           138.00 cm/s AV Vmean:          98.400 cm/s AV VTI:  0.202 m AV Peak Grad:      7.6 mmHg AV Mean Grad:      4.0 mmHg LVOT Vmax:         93.50 cm/s LVOT Vmean:        59.100 cm/s LVOT VTI:          0.107 m LVOT/AV VTI ratio: 0.53  AORTA Ao Root diam: 3.40 cm Ao Asc diam:  3.20 cm  SHUNTS Systemic VTI:  0.11 m Systemic Diam: 2.10 cm Lyman Bishop MD Electronically signed by Lyman Bishop MD Signature Date/Time: 11/16/2020/6:33:48 PM    Final    CT Angio Abd/Pel w/ and/or w/o  Result Date: 11/16/2020 CLINICAL DATA:  70 year old female with acute anemia status post left common femoral artery access for right lower extremity stenting. EXAM: CTA ABDOMEN AND PELVIS WITHOUT AND WITH CONTRAST TECHNIQUE: Multidetector CT imaging of the abdomen and pelvis was performed using the standard protocol during bolus administration of intravenous contrast. Multiplanar reconstructed images and MIPs were obtained and reviewed to evaluate the vascular anatomy. CONTRAST:  135m VISIPAQUE IODIXANOL 320 MG/ML IV SOLN COMPARISON:  CT abdomen pelvis from 08/30/2013 FINDINGS: VASCULAR Aorta: Normal caliber and patent throughout. Coarse, circumferential fibrofatty and calcific atherosclerotic calcifications.  Celiac: Patent without evidence of aneurysm, dissection, vasculitis or significant stenosis. SMA: Patent without evidence of aneurysm, dissection, vasculitis or significant stenosis. Renals: Single bilateral renal arteries are patent without evidence of aneurysm, dissection, vasculitis, fibromuscular dysplasia or significant stenosis. IMA: Patent without evidence of aneurysm, dissection, vasculitis or significant stenosis. Inflow: Patent without evidence of aneurysm, dissection, vasculitis or significant stenosis. Proximal Outflow: Patent proximal, incompletely visualized left superficial femoral artery stent. The proximal right profunda and superficial femoral artery are patent. No evidence of pseudoaneurysm status post left common femoral artery access. Veins: The a patent veins are patent. The portal system is patent and normal in caliber. Renal veins are patent bilaterally. No evidence of iliocaval thrombus or anatomic anomaly. Review of the MIP images confirms the above findings. NON-VASCULAR Lower chest: Bibasilar subsegmental atelectasis. Global cardiomegaly. Hepatobiliary: No focal liver abnormality is seen. Status post cholecystectomy. No biliary dilatation. Pancreas: Unremarkable. No pancreatic ductal dilatation or surrounding inflammatory changes. Spleen: Normal in size without focal abnormality. Adrenals/Urinary Tract: Adrenal glands are unremarkable. Kidneys are normal, without renal calculi, focal lesion, or hydronephrosis. Bladder is unremarkable. Stomach/Bowel: Stomach is within normal limits. Appendix appears normal. No evidence of bowel wall thickening, distention, or inflammatory changes. Lymphatic: No abdominopelvic lymphadenopathy. Reproductive: Status post hysterectomy. No adnexal masses. Other: No abdominal wall hernia or abnormality. No abdominopelvic ascites. Musculoskeletal: There is fat stranding about the left groin and lateral hip without focal fluid collection. Moderate multilevel  degenerative changes of the visualized thoracolumbar spine associated sigmoid curvature. No acute osseous abnormality or aggressive appearing osseous lesion. IMPRESSION: VASCULAR 1. Left groin and lateral hip fat stranding without evidence of pseudoaneurysm or active extravasation. No evidence of retroperitoneal hemorrhage as queried. 2.  Aortic Atherosclerosis (ICD10-I70.0). 3. Global cardiomegaly. NON-VASCULAR 1. Bibasilar subsegmental atelectasis. 2. Multilevel degenerative changes of the visualized thoracolumbar spine. DRuthann Cancer MD [facility] Electronically Signed   By: DRuthann CancerM.D.   On: 11/16/2020 12:52     Patient Profile     70y.o. female with a history of CAD, prior PCI RCA, HTN with HLD, and DM, and PAD who has 11/16/20 episode of new AF and NSTEMI  Assessment & Plan    NSTEMI Coronary Artery Disease; Obstructive Ischemic cardiomyopathy  Post procedural anemia s/p transfusion with improvement in hypotension with wide pulse pressures - asymptomatic but with new WMA - anatomy: prior RCA PCI - continue ASA 81 mg; plavix given recent SFA PCI - continue statin, goal LDL < 70 - BB held for hypotension, as is ACEi - on SGLT2i - will trial heparin no bolus; if tolerated well, may be reasonable for LHC - continue statin  New AF - in the setting of post procedural NSTEMI - attempted trial of [facility] today  For questions or updates, please contact [facility] Please consult [email or web address] for contact info under Cardiology/STEMI.      Signed, Werner Lean, MD  11/17/2020, 11:20 AM

## 2020-11-17 NOTE — Progress Notes (Signed)
Pt converted into NSR '@1941'$ , will continue to monitor

## 2020-11-17 NOTE — Progress Notes (Signed)
ANTICOAGULATION CONSULT NOTE  Pharmacy Consult for heparin Indication: chest pain/ACS and new AF  Allergies  Allergen Reactions   Strawberry Extract Itching, Swelling and Anaphylaxis    Mouth swells and gets itchy    Patient Measurements: Height: '5\' 5"'$  (165.1 cm) Weight: 104.3 kg (230 lb) IBW/kg (Calculated) : 57 Heparin Dosing Weight: 81.2 kg   Vital Signs: Temp: 99 F (37.2 C) (08/27 1541) Temp Source: Oral (08/27 1541) BP: 150/62 (08/27 1541) Pulse Rate: 101 (08/27 1541)  Labs: Recent Labs    11/16/20 1016 11/16/20 1418 11/16/20 1736 11/16/20 2233 11/17/20 0305 11/17/20 1853  HGB 8.9*  --  8.9*  --  10.5*  --   HCT 29.2*  --  30.5*  --  34.0*  --   PLT 380  --  406*  --  325  --   HEPARINUNFRC  --   --   --   --   --  <0.10*  CREATININE 0.87  --  0.94  --  0.90  --   TROPONINIHS 396* 276* 801* 2,073*  --   --      Estimated Creatinine Clearance: 71.7 mL/min (by C-G formula based on SCr of 0.9 mg/dL).   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   Hyperlipidemia with target LDL less than 70    Hypothyroidism (acquired)    Mild essential hypertension    Obesity (BMI 30-39.9) 11/17/2013   PAD (peripheral artery disease) ([place]) 05/26/2019   05/26/19: Abd AoGram- BLE runoff -> L SFA orbital atherectomy - PTA w/ DEB & Stent 6 x 40 Luttonix (for focal dissection) - patent Pop A with 3 V runoff. LEA Dopplers 01/03/2020: RABI (prev) 0.68 (0.53)/ RTBI (prev) 0.58 (0.33); LABI (prev) 0.80 (0.64), LTBI (prev) 0.64 (0.51); R mSFA ~50-74%, L mSFA 50-74%.  Patent Prox SFA stent < 49% stenosis   Panhypopituitarism (HCC)    ST elevation myocardial infarction (STEMI) of inferior wall, subsequent episode of care ([place]) 08/2013   80% branch of D1, 40% mid AV groove circumflex, 100% RCA with subacute thrombus -- thrombus extending into RPA V with 100% occlusion after initial angioplasty of mid RCA ;; Post MI ECHO 6/9/'15: EF 50-55%, mild LVH with moderate HK of inferior wall, Gr1 DD, mild LA dilation; mildly reduced RV function    Assessment: 70 year old female now postop day 2 status post right lower extremity revascularization with SFA stenting for CLI with tissue loss. Pt admitted after the procedure with a question of a thigh hematoma. CTA was obtained yesterday when she continued to drop her BP and there was no evidence of active arterial bleed or RP hematoma.   Per cardiology, attempting anticoagulation trial today in setting of post procedural NSTEMI and new AF. Provider requested no bolus be given. CBC is stable s/p PRBC x 2.  Initial heparin level undetectable, will increase infusion slowly given possible bleeding/hematoma issues above.  Goal of Therapy:  Heparin level 0.3-0.7 units/ml Monitor platelets by anticoagulation protocol: Yes  Plan:  Increase heparin to 1200 units/h Recheck heparin level in 8h  Arrie Senate, PharmD, [place], [facility] Clinical Pharmacist [phone] Please check AMION for all [place] numbers 11/17/2020

## 2020-11-17 NOTE — Progress Notes (Addendum)
Progress Note    11/17/2020 5:59 AM 2 Days Post-Op  Subjective:  denies chest pain or shortness of breath  Tm 99.6 now afebrile HR 90's-110's 0000000 systolic 0000000 [id number]  Vitals:   11/17/20 0200 11/17/20 0300  BP: (!) 120/51 (!) 103/46  Pulse: (!) 101 94  Resp: 15 18  Temp:  98.3 F (36.8 C)  SpO2: 98% 97%    Physical Exam: Cardiac:  regular Lungs:  non labored Incisions:  left groin/thigh tender but soft Extremities:  palpable right DP pulse; right foot is warm Abdomen:  soft  CBC    Component Value Date/Time   WBC 9.2 11/17/2020 0305   RBC 3.71 (L) 11/17/2020 0305   HGB 10.5 (L) 11/17/2020 0305   HCT 34.0 (L) 11/17/2020 0305   PLT 325 11/17/2020 0305   MCV 91.6 11/17/2020 0305   MCH 28.3 11/17/2020 0305   MCHC 30.9 11/17/2020 0305   RDW 15.9 (H) 11/17/2020 0305   LYMPHSABS 1.5 11/16/2020 1016   MONOABS 1.2 (H) 11/16/2020 1016   EOSABS 0.3 11/16/2020 1016   BASOSABS 0.1 11/16/2020 1016    BMET    Component Value Date/Time   NA 142 11/17/2020 0305   NA 142 01/03/2019 1449   K 3.4 (L) 11/17/2020 0305   CL 110 11/17/2020 0305   CO2 27 11/17/2020 0305   GLUCOSE 164 (H) 11/17/2020 0305   BUN 5 (L) 11/17/2020 0305   BUN 10 01/03/2019 1449   CREATININE 0.90 11/17/2020 0305   CREATININE 0.74 09/16/2013 1344   CALCIUM 8.1 (L) 11/17/2020 0305   GFRNONAA >60 11/17/2020 0305   GFRAA 21 (L) 06/23/2019 1530    INR    Component Value Date/Time   INR 1.1 05/26/2019 0342     Intake/Output Summary (Last 24 hours) at 11/17/2020 0559 Last data filed at 11/17/2020 0254 Gross per 24 hour  Intake 1429.02 ml  Output 5200 ml  Net -3770.98 ml    2D Echo 11/16/2020: 1. Left ventricular ejection fraction, by estimation, is 40 to 45%. The  left ventricle has mildly decreased function. The left ventricle  demonstrates regional wall motion abnormalities (see scoring  diagram/findings for description). Left ventricular  diastolic parameters are consistent  with Grade I diastolic dysfunction (impaired relaxation). There is severe hypokinesis of the left ventricular, entire inferolateral wall and inferior wall.   2. Right ventricular systolic function is normal. The right ventricular size is normal. Tricuspid regurgitation signal is inadequate for assessing PA pressure.   3. Left atrial size was moderately dilated.   4. The mitral valve is grossly normal. No evidence of mitral valve regurgitation.   5. The aortic valve was not well visualized. Aortic valve regurgitation is not visualized.   6. The inferior vena cava is normal in size with greater than 50% respiratory variability, suggesting right atrial pressure of 3 mmHg.   Assessment/Plan:  70 y.o. female is s/p:  left transfemoral access with right SFA stenting  2 Days Post-Op   -pt with palpable right DP pulse -troponin elevated, echo with decreased EF.  Pt denies CP or SOB. Given increased troponins, may need cardiac cath.   Appreciate cardiology evaluation.   -acute blood loss anemia improved with PRBC x 2.  -DVT prophylaxis:  SCD's for now-will discuss starting sq heparin with [name].  -continue asa/statin/plavix   Leontine Locket, PA-C [facility] [phone] 11/17/2020 5:59 AM  I have seen and evaluated the patient. I agree with the PA note as documented above.  70 year old female now postop day 2 status post right lower extremity revascularization with SFA stenting for CLI with tissue loss.  She was admitted after the procedure given suspected vagal event with a question of a thigh hematoma.  Ultimately CTA was obtained yesterday when she continued to drop her BP and there was no evidence of active arterial bleed or RP hematoma.  I suspect she has had a cardiac event and appreciate cardiology input.  Troponins are now over 2000.  Blood pressure is more stable today.  Hemoglobin 10.5 after transfusion per cardiology.  Palpable right DP pulse.  Continue aspirin  Plavix statin from my standpoint.  Marty Heck, MD [facility] [place] Office: [phone]

## 2020-11-18 ENCOUNTER — Inpatient Hospital Stay (HOSPITAL_COMMUNITY): Payer: Medicare Other

## 2020-11-18 DIAGNOSIS — I739 Peripheral vascular disease, unspecified: Secondary | ICD-10-CM | POA: Diagnosis not present

## 2020-11-18 DIAGNOSIS — I214 Non-ST elevation (NSTEMI) myocardial infarction: Secondary | ICD-10-CM | POA: Diagnosis not present

## 2020-11-18 LAB — GLUCOSE, CAPILLARY
Glucose-Capillary: 102 mg/dL — ABNORMAL HIGH (ref 70–99)
Glucose-Capillary: 132 mg/dL — ABNORMAL HIGH (ref 70–99)
Glucose-Capillary: 166 mg/dL — ABNORMAL HIGH (ref 70–99)
Glucose-Capillary: 197 mg/dL — ABNORMAL HIGH (ref 70–99)

## 2020-11-18 LAB — CBC
HCT: 33.1 % — ABNORMAL LOW (ref 36.0–46.0)
Hemoglobin: 10.3 g/dL — ABNORMAL LOW (ref 12.0–15.0)
MCH: 28.6 pg (ref 26.0–34.0)
MCHC: 31.1 g/dL (ref 30.0–36.0)
MCV: 91.9 fL (ref 80.0–100.0)
Platelets: 304 10*3/uL (ref 150–400)
RBC: 3.6 MIL/uL — ABNORMAL LOW (ref 3.87–5.11)
RDW: 15.7 % — ABNORMAL HIGH (ref 11.5–15.5)
WBC: 9.6 10*3/uL (ref 4.0–10.5)
nRBC: 0 % (ref 0.0–0.2)

## 2020-11-18 LAB — HEPARIN LEVEL (UNFRACTIONATED)
Heparin Unfractionated: 0.24 IU/mL — ABNORMAL LOW (ref 0.30–0.70)
Heparin Unfractionated: 0.41 IU/mL (ref 0.30–0.70)

## 2020-11-18 MED ORDER — SODIUM CHLORIDE 0.9% FLUSH: 3.0000 mL | INTRAVENOUS | Status: DC | PRN

## 2020-11-18 MED ORDER — ASPIRIN 81 MG PO CHEW
81.0000 mg | CHEWABLE_TABLET | ORAL | Status: AC
  Administered 2020-11-19: 81 mg via ORAL
  Filled 2020-11-18: qty 1

## 2020-11-18 MED ORDER — SODIUM CHLORIDE 0.9 % WEIGHT BASED INFUSION
1.0000 mL/kg/h | INTRAVENOUS | Status: DC
  Administered 2020-11-19: 1 mL/kg/h via INTRAVENOUS

## 2020-11-18 MED ORDER — SODIUM CHLORIDE 0.9 % IV SOLN: 250.0000 mL | INTRAVENOUS | Status: DC | PRN

## 2020-11-18 MED ORDER — SODIUM CHLORIDE 0.9% FLUSH
3.0000 mL | Freq: Two times a day (BID) | INTRAVENOUS | Status: DC
  Administered 2020-11-19: 3 mL via INTRAVENOUS

## 2020-11-18 MED ORDER — SODIUM CHLORIDE 0.9 % WEIGHT BASED INFUSION
3.0000 mL/kg/h | INTRAVENOUS | Status: DC
  Administered 2020-11-19: 3 mL/kg/h via INTRAVENOUS

## 2020-11-18 NOTE — Progress Notes (Signed)
ANTICOAGULATION CONSULT NOTE  Pharmacy Consult for heparin Indication: chest pain/ACS and new AF  Allergies  Allergen Reactions   Strawberry Extract Itching, Swelling and Anaphylaxis    Mouth swells and gets itchy    Patient Measurements: Height: '5\' 5"'$  (165.1 cm) Weight: 104.3 kg (230 lb) IBW/kg (Calculated) : 57 Heparin Dosing Weight: 81.2 kg   Vital Signs: Temp: 98.9 F (37.2 C) (08/28 0435) Temp Source: Oral (08/28 0435) BP: 137/55 (08/28 0435) Pulse Rate: 86 (08/28 0435)  Labs: Recent Labs    11/16/20 1016 11/16/20 1418 11/16/20 1736 11/16/20 2233 11/17/20 0305 11/17/20 1853 11/18/20 0558  HGB 8.9*  --  8.9*  --  10.5*  --  10.3*  HCT 29.2*  --  30.5*  --  34.0*  --  33.1*  PLT 380  --  406*  --  325  --  304  HEPARINUNFRC  --   --   --   --   --  <0.10* 0.24*  CREATININE 0.87  --  0.94  --  0.90  --   --   TROPONINIHS 396* 276* 801* 2,073*  --   --   --      Estimated Creatinine Clearance: 71.7 mL/min (by C-G formula based on SCr of 0.9 mg/dL).   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   Hyperlipidemia with target LDL less than 70    Hypothyroidism (acquired)    Mild essential hypertension    Obesity (BMI 30-39.9) 11/17/2013   PAD (peripheral artery disease) ([place]) 05/26/2019   05/26/19: Abd AoGram- BLE runoff -> L SFA orbital atherectomy - PTA w/ DEB & Stent 6 x 40 Luttonix (for focal dissection) - patent Pop A with 3 V runoff. LEA Dopplers 01/03/2020: RABI (prev) 0.68 (0.53)/ RTBI (prev) 0.58 (0.33); LABI (prev) 0.80 (0.64), LTBI  (prev) 0.64 (0.51); R mSFA ~50-74%, L mSFA 50-74%. Patent Prox SFA stent < 49% stenosis   Panhypopituitarism (HCC)    ST elevation myocardial infarction (STEMI) of inferior wall, subsequent episode of care ([place]) 08/2013   80% branch of D1, 40% mid AV groove circumflex, 100% RCA with subacute thrombus -- thrombus extending into RPA V with 100% occlusion after initial angioplasty of mid RCA ;; Post MI ECHO 6/9/'15: EF 50-55%, mild LVH with moderate HK of inferior wall, Gr1 DD, mild LA dilation; mildly reduced RV function    Assessment: 70 year old female now postop day 2 status post right lower extremity revascularization with SFA stenting for CLI with tissue loss. Pt admitted after the procedure with a question of a thigh hematoma. CTA was obtained yesterday when she continued to drop her BP and there was no evidence of active arterial bleed or RP hematoma.   Per cardiology, attempting anticoagulation trial today in setting of post procedural NSTEMI and new AF. Provider requested no bolus be given. CBC is stable s/p PRBC x 2.  Initial heparin level undetectable, will increase infusion slowly given possible bleeding/hematoma issues above.  8/28 AM  update:  Heparin level low but trending up  Goal of Therapy:  Heparin level 0.3-0.7 units/ml Monitor platelets by anticoagulation protocol: Yes   Plan:  Inc heparin to 1300 units/hr 1500 heparin level  Narda Bonds, PharmD, BCPS Clinical Pharmacist Phone: [phone]

## 2020-11-18 NOTE — Progress Notes (Addendum)
Progress Note    11/18/2020 6:59 AM 3 Days Post-Op  Subjective:  says it felt good to get out of bed yesterday.  Continues to deny chest pain or shortness of breath.    Afebrile HR [id number] NSR [id number] systolic A999333 [id number]  Vitals:   11/17/20 2346 11/18/20 0435  BP: (!) 137/53 (!) 137/55  Pulse: 89 86  Resp: 20 19  Temp: 98.9 F (37.2 C) 98.9 F (37.2 C)  SpO2: 94% 98%    Physical Exam: Cardiac:  regular Lungs:  non labored Incisions:  left groin with ecchymosis but soft. Extremities:  palpable right DP pulse     CBC    Component Value Date/Time   WBC 9.6 11/18/2020 0558   RBC 3.60 (L) 11/18/2020 0558   HGB 10.3 (L) 11/18/2020 0558   HCT 33.1 (L) 11/18/2020 0558   PLT 304 11/18/2020 0558   MCV 91.9 11/18/2020 0558   MCH 28.6 11/18/2020 0558   MCHC 31.1 11/18/2020 0558   RDW 15.7 (H) 11/18/2020 0558   LYMPHSABS 1.5 11/16/2020 1016   MONOABS 1.2 (H) 11/16/2020 1016   EOSABS 0.3 11/16/2020 1016   BASOSABS 0.1 11/16/2020 1016    BMET    Component Value Date/Time   NA 142 11/17/2020 0305   NA 142 01/03/2019 1449   K 3.4 (L) 11/17/2020 0305   CL 110 11/17/2020 0305   CO2 27 11/17/2020 0305   GLUCOSE 164 (H) 11/17/2020 0305   BUN 5 (L) 11/17/2020 0305   BUN 10 01/03/2019 1449   CREATININE 0.90 11/17/2020 0305   CREATININE 0.74 09/16/2013 1344   CALCIUM 8.1 (L) 11/17/2020 0305   GFRNONAA >60 11/17/2020 0305   GFRAA 21 (L) 06/23/2019 1530    INR    Component Value Date/Time   INR 1.1 05/26/2019 0342     Intake/Output Summary (Last 24 hours) at 11/18/2020 0659 Last data filed at 11/18/2020 0644 Gross per 24 hour  Intake 87.38 ml  Output 4525 ml  Net -4437.62 ml     Assessment/Plan:  70 y.o. female is s/p:  left transfemoral access with right SFA stenting   3 Days Post-Op   -pt doing well this am with palpable right DP pulse.  Her right leg wound is healing.  She showed me some pictures of what it has looked like over the past months.    -acute blood loss anemia continues to be stable since transfusion -pt most likely for [facility] tomorrow.  Heparin gtt has been started per cardiology -Pt with new cough and yellow sputum of past couple of days.  Will get cxr this morning.   Leontine Locket, PA-C [facility] [phone] 11/18/2020 6:59 AM  I have seen and evaluated the patient. I agree with the PA note as documented above. 70 year old female now postop day 3 status post right lower extremity revascularization with SFA stenting for CLI with tissue loss.  She was admitted after the procedure given suspected vagal event with a question of a thigh hematoma.  Ultimately CTA was obtained Friday when she continued to drop her BP and there was no evidence of active arterial bleed or RP hematoma.  I suspect she has had a cardiac event and appreciate cardiology input.  Troponins are now over 2000.  She has been started on heparin for suspected NSTEMI.  Likely cardiac cath tomorrow.  Aspirin Plavix for stent from my standpoint for her right SFA.  Right DP palpable.  Left groin stable.  Marty Heck, MD  [facility] [place] Office: [phone]

## 2020-11-18 NOTE — Progress Notes (Signed)
Progress Note  Patient Name: Tammy Good Date of Encounter: 11/18/2020  Primary Cardiologist: Glenetta Hew, MD   Subjective   No issues overnight.  No CP or SOB.  Has had no issues with bleeding since the transfusion.  Inpatient Medications    Scheduled Meds:  sodium chloride   Intravenous Once   aspirin EC  81 mg Oral Daily   atorvastatin  40 mg Oral q1800   Chlorhexidine Gluconate Cloth  6 each Topical Q0600   clopidogrel  75 mg Oral Q breakfast   dapagliflozin propanediol  10 mg Oral Daily   dexamethasone  0.5 mg Oral Daily   ferrous sulfate  325 mg Oral Q breakfast   insulin aspart  0-15 Units Subcutaneous TID WC   insulin aspart  0-5 Units Subcutaneous QHS   levothyroxine  175 mcg Oral QAC breakfast   mupirocin ointment  1 application Nasal BID   sodium chloride flush  3 mL Intravenous Q12H   Continuous Infusions:  sodium chloride     heparin 1,300 Units/hr (11/18/20 0949)   lactated ringers Stopped (11/15/20 2200)   PRN Meds: sodium chloride, acetaminophen, hydrALAZINE, labetalol, ondansetron (ZOFRAN) IV, sodium chloride flush   Vital Signs    Vitals:   11/17/20 2033 11/17/20 2346 11/18/20 0435 11/18/20 0741  BP: (!) 132/56 (!) 137/53 (!) 137/55 (!) 136/53  Pulse: 90 89 86 88  Resp: '20 20 19 20  '$ Temp: 98.9 F (37.2 C) 98.9 F (37.2 C) 98.9 F (37.2 C) 98.2 F (36.8 C)  TempSrc: Axillary Oral Oral Oral  SpO2: 95% 94% 98% 98%  Weight:      Height:        Intake/Output Summary (Last 24 hours) at 11/18/2020 1052 Last data filed at 11/18/2020 0644 Gross per 24 hour  Intake 87.38 ml  Output 3975 ml  Net -3887.62 ml   Filed Weights   11/15/20 0938  Weight: 104.3 kg    Telemetry    SR with PVCs Personally Reviewed  ECG    No new- Personally Reviewed  Physical Exam   GEN: No acute distress.   Neck: No JVD Cardiac: RRR, no murmurs, rubs, or gallops.  Respiratory: Wheezes similar to 11/17/20 GI: Soft, nontender, non-distended  MS: No  edema; Similar R high wound, improving tenderness over L fem access site Neuro:  Nonfocal  Psych: Normal affect   Labs    Chemistry Recent Labs  Lab 11/16/20 1016 11/16/20 1736 11/17/20 0305  NA 140 140 142  K 3.5 4.5 3.4*  CL 110 111 110  CO2 23 20* 27  GLUCOSE 151* 170* 164*  BUN 11 8 5*  CREATININE 0.87 0.94 0.90  CALCIUM 7.8* 7.7* 8.1*  GFRNONAA >60 >60 >60  ANIONGAP '7 9 5     '$ Hematology Recent Labs  Lab 11/16/20 1736 11/17/20 0305 11/18/20 0558  WBC 11.3* 9.2 9.6  RBC 3.25* 3.71* 3.60*  HGB 8.9* 10.5* 10.3*  HCT 30.5* 34.0* 33.1*  MCV 93.8 91.6 91.9  MCH 27.4 28.3 28.6  MCHC 29.2* 30.9 31.1  RDW 16.6* 15.9* 15.7*  PLT 406* 325 304    Cardiac EnzymesNo results for input(s): TROPONINI in the last 168 hours. No results for input(s): TROPIPOC in the last 168 hours.   BNP Recent Labs  Lab 11/16/20 1736  BNP 217.2*     DDimer No results for input(s): DDIMER in the last 168 hours.   Radiology    DG CHEST PORT 1 VIEW  Result Date: 11/18/2020  CLINICAL DATA:  Cough. Evaluate pleural effusions, pneumothorax, airspace disease, pneumonia. EXAM: PORTABLE CHEST 1 VIEW COMPARISON:  Chest x-rays dated 11/16/2020 and 08/29/2013. FINDINGS: Stable cardiomegaly. Mild prominence of the pulmonary arteries. Resolved central pulmonary vascular congestion. Lungs are now clear. No pleural effusion or pneumothorax is seen, all the LEFT costophrenic angle is obscured by the enlarged heart. IMPRESSION: 1. Resolved central pulmonary vascular congestion indicating improved fluid status. No evidence of pneumonia or pulmonary edema on today's exam. 2. LEFT costophrenic angle is obscured by the enlarged heart. A small LEFT-sided pleural effusion cannot be excluded. This could be more definitively characterized with a lateral view. 3. Stable cardiomegaly. 4. Mild prominence of the pulmonary arteries suggesting pulmonary arterial hypertension. Electronically Signed   By: Franki Cabot M.D.    On: 11/18/2020 10:17   DG CHEST PORT 1 VIEW  Result Date: 11/16/2020 CLINICAL DATA:  Hypotension EXAM: PORTABLE CHEST 1 VIEW COMPARISON:  08/29/2013 FINDINGS: Low lung volumes. Cardiomegaly with vascular congestion and probable small pleural effusions. Aortic atherosclerosis. No pneumothorax. Patchy left perihilar airspace disease and more focal opacity left base IMPRESSION: Cardiomegaly with vascular congestion and probable small pleural effusions. Opacity left lung base and ground-glass opacity left perihilar region, question pneumonia Electronically Signed   By: Donavan Foil M.D.   On: 11/16/2020 19:24   ECHOCARDIOGRAM COMPLETE  Result Date: 11/16/2020    ECHOCARDIOGRAM REPORT   Patient Name:   Tammy Good Date of Exam: 11/16/2020 Medical Rec #:  [id number]         Height:       65.0 in Accession #:    [id number]        Weight:       230.0 lb Date of Birth:  03/19/1951          BSA:          2.099 m Patient Age:    70 years          BP:           112/52 mmHg Patient Gender: F                 HR:           108 bpm. Exam Location:  Inpatient Procedure: 2D Echo, Cardiac Doppler, Color Doppler and Intracardiac            Opacification Agent Indications:    CAD Native Vessel I25.10  History:        Patient has prior history of Echocardiogram examinations, most                 recent 08/30/2013. Signs/Symptoms:Hypotension; Risk Factors:Morbid                 Obesity. Renal Disease. Anemia.  Sonographer:    Merrie Roof RDCS Referring Phys: [place]  1. Left ventricular ejection fraction, by estimation, is 40 to 45%. The left ventricle has mildly decreased function. The left ventricle demonstrates regional wall motion abnormalities (see scoring diagram/findings for description). Left ventricular diastolic parameters are consistent with Grade I diastolic dysfunction (impaired relaxation). There is severe hypokinesis of the left ventricular, entire inferolateral wall and inferior wall.  2.  Right ventricular systolic function is normal. The right ventricular size is normal. Tricuspid regurgitation signal is inadequate for assessing PA pressure.  3. Left atrial size was moderately dilated.  4. The mitral valve is grossly normal. No evidence of mitral valve regurgitation.  5. The aortic valve was  not well visualized. Aortic valve regurgitation is not visualized.  6. The inferior vena cava is normal in size with greater than 50% respiratory variability, suggesting right atrial pressure of 3 mmHg. Comparison(s): Changes from prior study are noted. 08/30/2013: LVEF 50-55%, severe inferior hypokinesis/akinesis. FINDINGS  Left Ventricle: Left ventricular ejection fraction, by estimation, is 40 to 45%. The left ventricle has mildly decreased function. The left ventricle demonstrates regional wall motion abnormalities. Severe hypokinesis of the left ventricular, entire inferolateral wall and inferior wall. Definity contrast agent was given IV to delineate the left ventricular endocardial borders. The left ventricular internal cavity size was normal in size. There is no left ventricular hypertrophy. Left ventricular diastolic parameters are consistent with Grade I diastolic dysfunction (impaired relaxation). Indeterminate filling pressures. Right Ventricle: The right ventricular size is normal. No increase in right ventricular wall thickness. Right ventricular systolic function is normal. Tricuspid regurgitation signal is inadequate for assessing PA pressure. Left Atrium: Left atrial size was moderately dilated. Right Atrium: Right atrial size was normal in size. Pericardium: There is no evidence of pericardial effusion. Mitral Valve: The mitral valve is grossly normal. No evidence of mitral valve regurgitation. Tricuspid Valve: The tricuspid valve is grossly normal. Tricuspid valve regurgitation is trivial. Aortic Valve: The aortic valve was not well visualized. Aortic valve regurgitation is not visualized.  Aortic valve mean gradient measures 4.0 mmHg. Aortic valve peak gradient measures 7.6 mmHg. Aortic valve area, by VTI measures 1.83 cm. Pulmonic Valve: The pulmonic valve was normal in structure. Pulmonic valve regurgitation is not visualized. Aorta: The aortic root and ascending aorta are structurally normal, with no evidence of dilitation. Venous: The inferior vena cava is normal in size with greater than 50% respiratory variability, suggesting right atrial pressure of 3 mmHg. IAS/Shunts: No atrial level shunt detected by color flow Doppler.  LEFT VENTRICLE PLAX 2D LVIDd:         4.30 cm  Diastology LVIDs:         3.40 cm  LV e' medial:  7.62 cm/s LV PW:         1.20 cm  LV e' lateral: 9.25 cm/s LV IVS:        1.10 cm LVOT diam:     2.10 cm LV SV:         37 LV SV Index:   18 LVOT Area:     3.46 cm  RIGHT VENTRICLE RV Basal diam:  3.70 cm RV S prime:     11.40 cm/s TAPSE (M-mode): 2.1 cm LEFT ATRIUM           Index       RIGHT ATRIUM           Index LA diam:      3.40 cm 1.62 cm/m  RA Area:     22.20 cm LA Vol (A4C): 83.4 ml 39.73 ml/m RA Volume:   64.00 ml  30.49 ml/m  AORTIC VALVE AV Area (Vmax):    2.35 cm AV Area (Vmean):   2.08 cm AV Area (VTI):     1.83 cm AV Vmax:           138.00 cm/s AV Vmean:          98.400 cm/s AV VTI:            0.202 m AV Peak Grad:      7.6 mmHg AV Mean Grad:      4.0 mmHg LVOT Vmax:  93.50 cm/s LVOT Vmean:        59.100 cm/s LVOT VTI:          0.107 m LVOT/AV VTI ratio: 0.53  AORTA Ao Root diam: 3.40 cm Ao Asc diam:  3.20 cm  SHUNTS Systemic VTI:  0.11 m Systemic Diam: 2.10 cm Lyman Bishop MD Electronically signed by Lyman Bishop MD Signature Date/Time: 11/16/2020/6:33:48 PM    Final    CT Angio Abd/Pel w/ and/or w/o  Result Date: 11/16/2020 CLINICAL DATA:  70 year old female with acute anemia status post left common femoral artery access for right lower extremity stenting. EXAM: CTA ABDOMEN AND PELVIS WITHOUT AND WITH CONTRAST TECHNIQUE: Multidetector CT  imaging of the abdomen and pelvis was performed using the standard protocol during bolus administration of intravenous contrast. Multiplanar reconstructed images and MIPs were obtained and reviewed to evaluate the vascular anatomy. CONTRAST:  130m VISIPAQUE IODIXANOL 320 MG/ML IV SOLN COMPARISON:  CT abdomen pelvis from 08/30/2013 FINDINGS: VASCULAR Aorta: Normal caliber and patent throughout. Coarse, circumferential fibrofatty and calcific atherosclerotic calcifications. Celiac: Patent without evidence of aneurysm, dissection, vasculitis or significant stenosis. SMA: Patent without evidence of aneurysm, dissection, vasculitis or significant stenosis. Renals: Single bilateral renal arteries are patent without evidence of aneurysm, dissection, vasculitis, fibromuscular dysplasia or significant stenosis. IMA: Patent without evidence of aneurysm, dissection, vasculitis or significant stenosis. Inflow: Patent without evidence of aneurysm, dissection, vasculitis or significant stenosis. Proximal Outflow: Patent proximal, incompletely visualized left superficial femoral artery stent. The proximal right profunda and superficial femoral artery are patent. No evidence of pseudoaneurysm status post left common femoral artery access. Veins: The a patent veins are patent. The portal system is patent and normal in caliber. Renal veins are patent bilaterally. No evidence of iliocaval thrombus or anatomic anomaly. Review of the MIP images confirms the above findings. NON-VASCULAR Lower chest: Bibasilar subsegmental atelectasis. Global cardiomegaly. Hepatobiliary: No focal liver abnormality is seen. Status post cholecystectomy. No biliary dilatation. Pancreas: Unremarkable. No pancreatic ductal dilatation or surrounding inflammatory changes. Spleen: Normal in size without focal abnormality. Adrenals/Urinary Tract: Adrenal glands are unremarkable. Kidneys are normal, without renal calculi, focal lesion, or hydronephrosis. Bladder  is unremarkable. Stomach/Bowel: Stomach is within normal limits. Appendix appears normal. No evidence of bowel wall thickening, distention, or inflammatory changes. Lymphatic: No abdominopelvic lymphadenopathy. Reproductive: Status post hysterectomy. No adnexal masses. Other: No abdominal wall hernia or abnormality. No abdominopelvic ascites. Musculoskeletal: There is fat stranding about the left groin and lateral hip without focal fluid collection. Moderate multilevel degenerative changes of the visualized thoracolumbar spine associated sigmoid curvature. No acute osseous abnormality or aggressive appearing osseous lesion. IMPRESSION: VASCULAR 1. Left groin and lateral hip fat stranding without evidence of pseudoaneurysm or active extravasation. No evidence of retroperitoneal hemorrhage as queried. 2.  Aortic Atherosclerosis (ICD10-I70.0). 3. Global cardiomegaly. NON-VASCULAR 1. Bibasilar subsegmental atelectasis. 2. Multilevel degenerative changes of the visualized thoracolumbar spine. DRuthann Cancer MD [facility] Electronically Signed   By: DRuthann CancerM.D.   On: 11/16/2020 12:52     Patient Profile     70y.o. female with a history of CAD, prior PCI RCA, HTN with HLD, and DM, and PAD who has 11/16/20 episode of new AF and NSTEMI  Assessment & Plan    NSTEMI Coronary Artery Disease; Obstructive Ischemic cardiomyopathy Post procedural anemia s/p transfusion with improvement in hypotension with wide pulse pressures - asymptomatic but with new WMA - anatomy: prior RCA PCI - continue ASA 81 mg; plavix given recent  SFA PCI - continue statin, goal LDL < 70 - BB held for hypotension, as is ACEi - on SGLT2i - tolerating heparin well - continue statin - Risks and benefits of cardiac catheterization have been discussed with the patient.  These include bleeding, infection, kidney damage, stroke, heart attack, death.  The patient understands  these risks and is willing to proceed. - NPO at midnight - Radial approach recommended if feasible (wound, habitus)  New AF - in the setting of post procedural NSTEMI; no resolved - attempted trial of AC today; do not plan for triple therapy at this time unless return of AF  For questions or updates, please contact [place] Please consult [email or web address] for contact info under Cardiology/STEMI.      Signed, Werner Lean, MD  11/18/2020, 10:52 AM

## 2020-11-18 NOTE — Progress Notes (Signed)
Obtained consent form and placed in pt's chart.   Kester Stimpson S Noorah Giammona, RN  

## 2020-11-18 NOTE — Progress Notes (Signed)
ANTICOAGULATION CONSULT NOTE  Pharmacy Consult for heparin Indication: chest pain/ACS and new AF  Allergies  Allergen Reactions   Strawberry Extract Itching, Swelling and Anaphylaxis    Mouth swells and gets itchy    Patient Measurements: Height: '5\' 5"'$  (165.1 cm) Weight: 104.3 kg (230 lb) IBW/kg (Calculated) : 57 Heparin Dosing Weight: 81.2 kg   Vital Signs: Temp: 98.8 F (37.1 C) (08/28 1600) Temp Source: Oral (08/28 1600) BP: 135/65 (08/28 1600) Pulse Rate: 81 (08/28 1600)  Labs: Recent Labs    11/16/20 1016 11/16/20 1418 11/16/20 1736 11/16/20 2233 11/17/20 0305 11/17/20 1853 11/18/20 0558 11/18/20 1558  HGB 8.9*  --  8.9*  --  10.5*  --  10.3*  --   HCT 29.2*  --  30.5*  --  34.0*  --  33.1*  --   PLT 380  --  406*  --  325  --  304  --   HEPARINUNFRC  --   --   --   --   --  <0.10* 0.24* 0.41  CREATININE 0.87  --  0.94  --  0.90  --   --   --   TROPONINIHS 396* 276* 801* 2,073*  --   --   --   --      Estimated Creatinine Clearance: 71.7 mL/min (by C-G formula based on SCr of 0.9 mg/dL).   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   Hyperlipidemia with target LDL less than 70    Hypothyroidism (acquired)    Mild essential hypertension    Obesity (BMI 30-39.9) 11/17/2013   PAD (peripheral artery disease) ([place]) 05/26/2019   05/26/19: Abd AoGram- BLE runoff -> L SFA orbital atherectomy - PTA w/ DEB & Stent 6 x 40 Luttonix (for focal dissection) - patent Pop A with 3 V runoff. LEA Dopplers 01/03/2020: RABI (prev) 0.68 (0.53)/ RTBI (prev)  0.58 (0.33); LABI (prev) 0.80 (0.64), LTBI (prev) 0.64 (0.51); R mSFA ~50-74%, L mSFA 50-74%. Patent Prox SFA stent < 49% stenosis   Panhypopituitarism (HCC)    ST elevation myocardial infarction (STEMI) of inferior wall, subsequent episode of care ([place]) 08/2013   80% branch of D1, 40% mid AV groove circumflex, 100% RCA with subacute thrombus -- thrombus extending into RPA V with 100% occlusion after initial angioplasty of mid RCA ;; Post MI ECHO 6/9/'15: EF 50-55%, mild LVH with moderate HK of inferior wall, Gr1 DD, mild LA dilation; mildly reduced RV function    Assessment: 70 year old female now postop day 2 status post right lower extremity revascularization with SFA stenting for CLI with tissue loss. Pt admitted after the procedure with a question of a thigh hematoma. CTA was obtained yesterday when she continued to drop her BP and there was no evidence of active arterial bleed or RP hematoma.   Per cardiology, attempting anticoagulation trial in setting of post procedural NSTEMI and new AF. Provider requested no bolus be given.   Heparin level therapeutic at 0.41,  CBC stable.  Goal of Therapy:  Heparin level 0.3-0.7 units/ml Monitor platelets by anticoagulation protocol: Yes   Plan:  Continue heparin 1300 units/h Daily heparin level and CBC  Arrie Senate, PharmD, [place], [facility] Clinical Pharmacist [phone] Please check AMION for all [place] numbers 11/18/2020

## 2020-11-19 ENCOUNTER — Encounter (HOSPITAL_COMMUNITY)
Admission: RE | Disposition: A | Discharge status: Home / Self Care | Payer: Self-pay | Source: Home / Self Care | Attending: Vascular Surgery

## 2020-11-19 DIAGNOSIS — E039 Hypothyroidism, unspecified: Secondary | ICD-10-CM

## 2020-11-19 DIAGNOSIS — I5021 Acute systolic (congestive) heart failure: Secondary | ICD-10-CM | POA: Diagnosis not present

## 2020-11-19 DIAGNOSIS — I214 Non-ST elevation (NSTEMI) myocardial infarction: Secondary | ICD-10-CM

## 2020-11-19 DIAGNOSIS — I2510 Atherosclerotic heart disease of native coronary artery without angina pectoris: Secondary | ICD-10-CM | POA: Diagnosis not present

## 2020-11-19 HISTORY — PX: LEFT HEART CATH AND CORONARY ANGIOGRAPHY: CATH118249

## 2020-11-19 HISTORY — PX: CORONARY STENT INTERVENTION: CATH118234

## 2020-11-19 LAB — CBC
HCT: 33.6 % — ABNORMAL LOW (ref 36.0–46.0)
Hemoglobin: 10.2 g/dL — ABNORMAL LOW (ref 12.0–15.0)
MCH: 27.9 pg (ref 26.0–34.0)
MCHC: 30.4 g/dL (ref 30.0–36.0)
MCV: 91.8 fL (ref 80.0–100.0)
Platelets: 310 10*3/uL (ref 150–400)
RBC: 3.66 MIL/uL — ABNORMAL LOW (ref 3.87–5.11)
RDW: 15.3 % (ref 11.5–15.5)
WBC: 8.6 10*3/uL (ref 4.0–10.5)
nRBC: 0 % (ref 0.0–0.2)

## 2020-11-19 LAB — BASIC METABOLIC PANEL
Anion gap: 9 (ref 5–15)
BUN: 5 mg/dL — ABNORMAL LOW (ref 8–23)
CO2: 25 mmol/L (ref 22–32)
Calcium: 8.3 mg/dL — ABNORMAL LOW (ref 8.9–10.3)
Chloride: 106 mmol/L (ref 98–111)
Creatinine, Ser: 0.73 mg/dL (ref 0.44–1.00)
GFR, Estimated: >60 mL/min (ref >60)
Glucose, Bld: 134 mg/dL — ABNORMAL HIGH (ref 70–99)
Potassium: 3.3 mmol/L — ABNORMAL LOW (ref 3.5–5.1)
Sodium: 140 mmol/L (ref 135–145)

## 2020-11-19 LAB — GLUCOSE, CAPILLARY
Glucose-Capillary: 133 mg/dL — ABNORMAL HIGH (ref 70–99)
Glucose-Capillary: 115 mg/dL — ABNORMAL HIGH (ref 70–99)
Glucose-Capillary: 133 mg/dL — ABNORMAL HIGH (ref 70–99)
Glucose-Capillary: 160 mg/dL — ABNORMAL HIGH (ref 70–99)

## 2020-11-19 LAB — LIPID PANEL
Cholesterol: 87 mg/dL (ref 0–200)
HDL: 24 mg/dL — ABNORMAL LOW (ref >40)
LDL Cholesterol: 46 mg/dL (ref 0–99)
Total CHOL/HDL Ratio: 3.6 RATIO
Triglycerides: 85 mg/dL (ref <150)
VLDL: 17 mg/dL (ref 0–40)

## 2020-11-19 LAB — POCT ACTIVATED CLOTTING TIME
Activated Clotting Time: 277 seconds
Activated Clotting Time: 283 seconds
Activated Clotting Time: 289 seconds

## 2020-11-19 LAB — HEPARIN LEVEL (UNFRACTIONATED): Heparin Unfractionated: 0.18 IU/mL — ABNORMAL LOW (ref 0.30–0.70)

## 2020-11-19 SURGERY — LEFT HEART CATH AND CORONARY ANGIOGRAPHY
Anesthesia: LOCAL

## 2020-11-19 MED ORDER — LIDOCAINE HCL (PF) 1 % IJ SOLN
INTRAMUSCULAR | Status: DC | PRN
  Administered 2020-11-19: 2 mL

## 2020-11-19 MED ORDER — FENTANYL CITRATE (PF) 100 MCG/2ML IJ SOLN
INTRAMUSCULAR | Status: DC | PRN
  Administered 2020-11-19 (×2): 25 ug via INTRAVENOUS

## 2020-11-19 MED ORDER — HEPARIN SODIUM (PORCINE) 1000 UNIT/ML IJ SOLN
INTRAMUSCULAR | Status: DC | PRN
  Administered 2020-11-19 (×2): 2000 [IU] via INTRAVENOUS
  Administered 2020-11-19 (×2): 5000 [IU] via INTRAVENOUS
  Administered 2020-11-19: 2000 [IU] via INTRAVENOUS

## 2020-11-19 MED ORDER — SODIUM CHLORIDE 0.9 % IV SOLN: INTRAVENOUS | Status: AC

## 2020-11-19 MED ORDER — MIDAZOLAM HCL 2 MG/2ML IJ SOLN
INTRAMUSCULAR | Status: AC
  Filled 2020-11-19: qty 2

## 2020-11-19 MED ORDER — HYDRALAZINE HCL 20 MG/ML IJ SOLN: 10.0000 mg | INTRAMUSCULAR | Status: AC | PRN

## 2020-11-19 MED ORDER — PANTOPRAZOLE SODIUM 40 MG PO TBEC
40.0000 mg | DELAYED_RELEASE_TABLET | Freq: Two times a day (BID) | ORAL | Status: DC
  Administered 2020-11-19 – 2020-11-22 (×6): 40 mg via ORAL
  Filled 2020-11-19 (×6): qty 1

## 2020-11-19 MED ORDER — HEPARIN SODIUM (PORCINE) 1000 UNIT/ML IJ SOLN
INTRAMUSCULAR | Status: AC
  Filled 2020-11-19: qty 1

## 2020-11-19 MED ORDER — SODIUM CHLORIDE 0.9% FLUSH
3.0000 mL | Freq: Two times a day (BID) | INTRAVENOUS | Status: DC
  Administered 2020-11-19 – 2020-11-21 (×4): 3 mL via INTRAVENOUS

## 2020-11-19 MED ORDER — VERAPAMIL HCL 2.5 MG/ML IV SOLN
INTRAVENOUS | Status: DC | PRN
  Administered 2020-11-19 (×2): 10 mL via INTRA_ARTERIAL

## 2020-11-19 MED ORDER — METOPROLOL TARTRATE 25 MG PO TABS
25.0000 mg | ORAL_TABLET | Freq: Two times a day (BID) | ORAL | Status: DC
  Administered 2020-11-19 – 2020-11-20 (×3): 25 mg via ORAL
  Filled 2020-11-19 (×3): qty 1

## 2020-11-19 MED ORDER — SODIUM CHLORIDE 0.9 % IV SOLN: 250.0000 mL | INTRAVENOUS | Status: DC | PRN

## 2020-11-19 MED ORDER — LIDOCAINE HCL (PF) 1 % IJ SOLN
INTRAMUSCULAR | Status: AC
  Filled 2020-11-19: qty 30

## 2020-11-19 MED ORDER — CLOPIDOGREL BISULFATE 300 MG PO TABS
ORAL_TABLET | ORAL | Status: DC | PRN
  Administered 2020-11-19: 300 mg via ORAL

## 2020-11-19 MED ORDER — POTASSIUM CHLORIDE CRYS ER 20 MEQ PO TBCR
40.0000 meq | EXTENDED_RELEASE_TABLET | Freq: Once | ORAL | Status: AC
  Administered 2020-11-19: 40 meq via ORAL
  Filled 2020-11-19: qty 2

## 2020-11-19 MED ORDER — LABETALOL HCL 5 MG/ML IV SOLN
10.0000 mg | INTRAVENOUS | Status: AC | PRN
Start: 2020-11-19 — End: 2020-11-19

## 2020-11-19 MED ORDER — HEPARIN (PORCINE) IN NACL 1000-0.9 UT/500ML-% IV SOLN
INTRAVENOUS | Status: AC
  Filled 2020-11-19: qty 1000

## 2020-11-19 MED ORDER — FENTANYL CITRATE (PF) 100 MCG/2ML IJ SOLN
INTRAMUSCULAR | Status: AC
  Filled 2020-11-19: qty 2

## 2020-11-19 MED ORDER — IOHEXOL 350 MG/ML SOLN
INTRAVENOUS | Status: DC | PRN
  Administered 2020-11-19: 130 mL

## 2020-11-19 MED ORDER — HEPARIN (PORCINE) IN NACL 1000-0.9 UT/500ML-% IV SOLN
INTRAVENOUS | Status: DC | PRN
  Administered 2020-11-19 (×2): 500 mL

## 2020-11-19 MED ORDER — NITROGLYCERIN 1 MG/10 ML FOR IR/CATH LAB
INTRA_ARTERIAL | Status: AC
  Filled 2020-11-19: qty 10

## 2020-11-19 MED ORDER — MIDAZOLAM HCL 2 MG/2ML IJ SOLN
INTRAMUSCULAR | Status: DC | PRN
  Administered 2020-11-19 (×2): 1 mg via INTRAVENOUS

## 2020-11-19 MED ORDER — SODIUM CHLORIDE 0.9 % IV SOLN
INTRAVENOUS | Status: AC | PRN
  Administered 2020-11-19: 1 mL/kg/h via INTRAVENOUS

## 2020-11-19 MED ORDER — VERAPAMIL HCL 2.5 MG/ML IV SOLN
INTRAVENOUS | Status: AC
  Filled 2020-11-19: qty 2

## 2020-11-19 MED ORDER — NITROGLYCERIN 1 MG/10 ML FOR IR/CATH LAB
INTRA_ARTERIAL | Status: DC | PRN
  Administered 2020-11-19 (×2): 200 ug via INTRACORONARY

## 2020-11-19 MED ORDER — LEVOTHYROXINE SODIUM 25 MCG PO TABS
125.0000 ug | ORAL_TABLET | Freq: Every day | ORAL | Status: DC
  Administered 2020-11-20 – 2020-11-22 (×3): 125 ug via ORAL
  Filled 2020-11-19 (×3): qty 1

## 2020-11-19 MED ORDER — SODIUM CHLORIDE 0.9% FLUSH: 3.0000 mL | INTRAVENOUS | Status: DC | PRN

## 2020-11-19 MED ORDER — HEPARIN (PORCINE) 25000 UT/250ML-% IV SOLN
1600.0000 [IU]/h | INTRAVENOUS | Status: DC
  Administered 2020-11-19: 1500 [IU]/h via INTRAVENOUS
  Administered 2020-11-20: 1600 [IU]/h via INTRAVENOUS
  Filled 2020-11-19 (×2): qty 250

## 2020-11-19 SURGICAL SUPPLY — 23 items
BALLN EMERGE MR 2.0X8 (BALLOONS) ×2
BALLN SAPPHIRE 2.0X12 (BALLOONS) ×2
BALLN ~~LOC~~ EMERGE MR 2.5X8 (BALLOONS) ×2
BALLOON EMERGE MR 2.0X8 (BALLOONS) IMPLANT
BALLOON SAPPHIRE 2.0X12 (BALLOONS) IMPLANT
BALLOON ~~LOC~~ EMERGE MR 2.5X8 (BALLOONS) IMPLANT
CATH LAUNCHER 6FR EBU3.5 (CATHETERS) ×1 IMPLANT
CATH OPTITORQUE TIG 4.0 5F (CATHETERS) ×1 IMPLANT
DEVICE RAD COMP TR BAND LRG (VASCULAR PRODUCTS) ×1 IMPLANT
GLIDESHEATH SLEND A-KIT 6F 22G (SHEATH) ×1 IMPLANT
GUIDELINER 6F (CATHETERS) ×1 IMPLANT
GUIDEWIRE INQWIRE 1.5J.035X260 (WIRE) IMPLANT
INQWIRE 1.5J .035X260CM (WIRE) ×2
KIT ENCORE 26 ADVANTAGE (KITS) ×1 IMPLANT
KIT HEART LEFT (KITS) ×2 IMPLANT
PACK CARDIAC CATHETERIZATION (CUSTOM PROCEDURE TRAY) ×2 IMPLANT
STENT ONYX FRONTIER 2.5X12 (Permanent Stent) ×1 IMPLANT
TRANSDUCER W/STOPCOCK (MISCELLANEOUS) ×2 IMPLANT
TUBING CIL FLEX 10 FLL-RA (TUBING) ×2 IMPLANT
WIRE ASAHI GRAND SLAM 180CM (WIRE) ×1 IMPLANT
WIRE HI TORQ VERSACORE-J 145CM (WIRE) ×1 IMPLANT
WIRE HI TORQ WHISPER MS 190CM (WIRE) ×1 IMPLANT
WIRE RUNTHROUGH .014X180CM (WIRE) ×1 IMPLANT

## 2020-11-19 NOTE — Progress Notes (Signed)
Cardiology Progress Note  Patient ID: Tammy Good MRN: [id number] DOB: May 16, 1950 Date of Encounter: 11/19/2020  Primary Cardiologist: Glenetta Hew, MD  Subjective   Chief Complaint: None.  HPI: N.p.o. for left heart catheterization.  Denies any chest pain today.  Hemoglobin appears stable.  ROS:  All other ROS reviewed and negative. Pertinent positives noted in the HPI.     Inpatient Medications  Scheduled Meds:  sodium chloride   Intravenous Once   aspirin EC  81 mg Oral Daily   atorvastatin  40 mg Oral q1800   Chlorhexidine Gluconate Cloth  6 each Topical Q0600   clopidogrel  75 mg Oral Q breakfast   dapagliflozin propanediol  10 mg Oral Daily   dexamethasone  0.5 mg Oral Daily   ferrous sulfate  325 mg Oral Q breakfast   insulin aspart  0-15 Units Subcutaneous TID WC   insulin aspart  0-5 Units Subcutaneous QHS   levothyroxine  175 mcg Oral QAC breakfast   mupirocin ointment  1 application Nasal BID   sodium chloride flush  3 mL Intravenous Q12H   sodium chloride flush  3 mL Intravenous Q12H   Continuous Infusions:  sodium chloride     sodium chloride     sodium chloride 1 mL/kg/hr (11/19/20 0530)   heparin 1,500 Units/hr (11/19/20 0428)   lactated ringers Stopped (11/15/20 2200)   PRN Meds: sodium chloride, sodium chloride, acetaminophen, hydrALAZINE, labetalol, ondansetron (ZOFRAN) IV, sodium chloride flush, sodium chloride flush   Vital Signs   Vitals:   11/18/20 1114 11/18/20 1600 11/19/20 0415 11/19/20 0824  BP: 134/63 135/65 (!) 151/46 136/63  Pulse: 88 81  77  Resp: '16 18 20 19  '$ Temp: 98.3 F (36.8 C) 98.8 F (37.1 C) 98.4 F (36.9 C) 98.1 F (36.7 C)  TempSrc: Oral Oral Oral Oral  SpO2: 97% 97% 98% 95%  Weight:      Height:        Intake/Output Summary (Last 24 hours) at 11/19/2020 0941 Last data filed at 11/19/2020 0800 Gross per 24 hour  Intake 480 ml  Output 2050 ml  Net -1570 ml   Last 3 Weights 11/15/2020 11/06/2020 10/22/2020   Weight (lbs) 230 lb 232 lb 12.8 oz 232 lb  Weight (kg) 104.327 kg 105.597 kg 105.235 kg      Telemetry  Overnight telemetry shows sinus rhythm in the 70s, which I personally reviewed.   ECG  The most recent ECG shows sinus rhythm heart rate 89, old inferior infarct, subtle ST depressions noted the anterior lateral leads, which I personally reviewed.   Physical Exam   Vitals:   11/18/20 1114 11/18/20 1600 11/19/20 0415 11/19/20 0824  BP: 134/63 135/65 (!) 151/46 136/63  Pulse: 88 81  77  Resp: '16 18 20 19  '$ Temp: 98.3 F (36.8 C) 98.8 F (37.1 C) 98.4 F (36.9 C) 98.1 F (36.7 C)  TempSrc: Oral Oral Oral Oral  SpO2: 97% 97% 98% 95%  Weight:      Height:        Intake/Output Summary (Last 24 hours) at 11/19/2020 0941 Last data filed at 11/19/2020 0800 Gross per 24 hour  Intake 480 ml  Output 2050 ml  Net -1570 ml    Last 3 Weights 11/15/2020 11/06/2020 10/22/2020  Weight (lbs) 230 lb 232 lb 12.8 oz 232 lb  Weight (kg) 104.327 kg 105.597 kg 105.235 kg    Body mass index is 38.27 kg/m.  General: Well nourished, well developed, in no  acute distress Head: Atraumatic, normal size  Eyes: PEERLA, EOMI  Neck: Supple, no JVD Endocrine: No thryomegaly Cardiac: Normal S1, S2; RRR; no murmurs, rubs, or gallops Lungs: Clear to auscultation bilaterally, no wheezing, rhonchi or rales  Abd: Soft, nontender, no hepatomegaly  Ext: Right lower extremity in clean dressing, 1+ edema noted, poor pulses noted Musculoskeletal: No deformities, BUE and BLE strength normal and equal Skin: Warm and dry, no rashes   Neuro: Alert and oriented to person, place, time, and situation, CNII-XII grossly intact, no focal deficits  Psych: Normal mood and affect   Labs  High Sensitivity Troponin:   Recent Labs  Lab 11/16/20 1016 11/16/20 1418 11/16/20 1736 11/16/20 2233  TROPONINIHS 396* 276* 801* 2,073*     Cardiac EnzymesNo results for input(s): TROPONINI in the last 168 hours. No results for  input(s): TROPIPOC in the last 168 hours.  Chemistry Recent Labs  Lab 11/16/20 1736 11/17/20 0305 11/19/20 0152  NA 140 142 140  K 4.5 3.4* 3.3*  CL 111 110 106  CO2 20* 27 25  GLUCOSE 170* 164* 134*  BUN 8 5* 5*  CREATININE 0.94 0.90 0.73  CALCIUM 7.7* 8.1* 8.3*  GFRNONAA >60 >60 >60  ANIONGAP '9 5 9    '$ Hematology Recent Labs  Lab 11/17/20 0305 11/18/20 0558 11/19/20 0152  WBC 9.2 9.6 8.6  RBC 3.71* 3.60* 3.66*  HGB 10.5* 10.3* 10.2*  HCT 34.0* 33.1* 33.6*  MCV 91.6 91.9 91.8  MCH 28.3 28.6 27.9  MCHC 30.9 31.1 30.4  RDW 15.9* 15.7* 15.3  PLT 325 304 310   BNP Recent Labs  Lab 11/16/20 1736  BNP 217.2*    DDimer No results for input(s): DDIMER in the last 168 hours.   Radiology  DG CHEST PORT 1 VIEW  Result Date: 11/18/2020 CLINICAL DATA:  Cough. Evaluate pleural effusions, pneumothorax, airspace disease, pneumonia. EXAM: PORTABLE CHEST 1 VIEW COMPARISON:  Chest x-rays dated 11/16/2020 and 08/29/2013. FINDINGS: Stable cardiomegaly. Mild prominence of the pulmonary arteries. Resolved central pulmonary vascular congestion. Lungs are now clear. No pleural effusion or pneumothorax is seen, all the LEFT costophrenic angle is obscured by the enlarged heart. IMPRESSION: 1. Resolved central pulmonary vascular congestion indicating improved fluid status. No evidence of pneumonia or pulmonary edema on today's exam. 2. LEFT costophrenic angle is obscured by the enlarged heart. A small LEFT-sided pleural effusion cannot be excluded. This could be more definitively characterized with a lateral view. 3. Stable cardiomegaly. 4. Mild prominence of the pulmonary arteries suggesting pulmonary arterial hypertension. Electronically Signed   By: Franki Cabot M.D.   On: 11/18/2020 10:17    Cardiac Studies  TTE 11/16/2020  1. Left ventricular ejection fraction, by estimation, is 40 to 45%. The  left ventricle has mildly decreased function. The left ventricle  demonstrates regional wall  motion abnormalities (see scoring  diagram/findings for description). Left ventricular  diastolic parameters are consistent with Grade I diastolic dysfunction  (impaired relaxation). There is severe hypokinesis of the left  ventricular, entire inferolateral wall and inferior wall.   2. Right ventricular systolic function is normal. The right ventricular  size is normal. Tricuspid regurgitation signal is inadequate for assessing  PA pressure.   3. Left atrial size was moderately dilated.   4. The mitral valve is grossly normal. No evidence of mitral valve  regurgitation.   5. The aortic valve was not well visualized. Aortic valve regurgitation  is not visualized.   6. The inferior vena cava is normal in  size with greater than 50%  respiratory variability, suggesting right atrial pressure of 3 mmHg.   Patient Profile  SERAI HOYLAND is a 70 y.o. 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  She developed postoperative hypotension on 11/16/2020.  Newly reduced EF.  Elevated troponin concerning for non-STEMI.  Assessment & Plan   Non-STEMI -History of inferior ST elevation myocardial infarction in 2015.  Had an EF around 50% with inferior wall motion abnormalities. -Admitted on 11/15/2020 with critical limb ischemia and underwent right SFA stenting with vascular surgery.  Postoperatively developed hypotension.  Course was complicated by anemia with CT demonstrated no RP bleed or groin hematoma.  Suspect this was just procedurally related. -Troponin was elevated.  EKG without ST elevation. -Suspect her hypotension could have been related to non-STEMI.  EF is now reduced.  I do wonder if she has multivessel CAD.  N.p.o. for left heart cath today. -She remains on aspirin, Plavix and heparin  drip. -Metoprolol was held in setting of hypotension.  She is normotensive.  We will start metoprolol tartrate 25 twice daily. -Continue high intensity statin.  LDL at goal. -A1c 6.7.  She is on an SGLT2 inhibitor.  2. Ischemic cardiomyopathy, EF 40-45% -Concerned that EF drop is related to non-STEMI.  We will hold further fluids. -Euvolemic.  Better idea of volume status at the time of heart cath from LVEDP. -No need for diuresis. -We are transitioning to metoprolol tartrate today.  Likely will transition to ARB as we are able.  She is on an SGLT2 inhibitor.  Likely will start a MRA as we are able.  Again she is had hypotension postoperatively.  We will be cautious with this.  3.  Atrial fibrillation with RVR -Developed brief A. fib when she was hypotensive on 11/16/2020.  Has converted back to sinus rhythm. -Starting beta-blocker as above. -On heparin drip. -Likely will plan for Plavix and DOAC as long as she does not have multivessel CAD.  For now continue heparin drip. -Labs notable for gross hyperthyroidism.  I have adjusted her Synthroid.  See below.  4.  Hypothyroidism now with Iatrogenic hyperthyroidism  -She is on Synthroid supplementation.  TSH is undetectable.  She is on too much Synthroid.  I have reduce this to 125 mcg daily.  This could be contributing to her atrial fibrillation.  We will plan to recheck a TSH in 4 to 6 weeks.  5. Postoperative Anemia -No signs of bleeding.  Suspect this was just a transient loss.  No RP bleed on CT scan.  She has received 1 unit of blood.  Hemoglobin is stable.  Proceed with left heart cath today.  Shared Decision Making/Informed Consent The risks [stroke (1 in 1000), death (1 in 1000), kidney failure [usually temporary] (1 in 500), bleeding (1 in 200), allergic reaction [possibly serious] (1 in 200)], benefits (diagnostic support and management of coronary artery disease) and alternatives of a cardiac catheterization were discussed in detail  with Ms. Zuchowski and she is willing to proceed.  For questions or updates, please contact [place] Please consult [email or web address] for contact info under   Time Spent with Patient: I have spent a total of 35 minutes with patient reviewing hospital notes, telemetry, EKGs, labs and examining the patient as well as establishing an assessment and plan that was discussed with the  patient.  > 50% of time was spent in direct patient care.    Signed, Addison Naegeli. Audie Box, MD, [place]  11/19/2020 9:41 AM

## 2020-11-19 NOTE — Progress Notes (Signed)
PT Cancellation Note  Patient Details Name: Tammy Good MRN: [id number] DOB: [birth year]   Cancelled Treatment:    Reason Eval/Treat Not Completed: Patient at procedure or test/unavailable  Pt gone for cardiac cath and then will be on bedrest.  Will f/u today if time allows, otherwise will f/u tomorrow.  Abran Richard, PT Acute Rehab Services Pager [phone] [facility] [phone]  Karlton Lemon 11/19/2020, 11:27 AM

## 2020-11-19 NOTE — Progress Notes (Signed)
ANTICOAGULATION CONSULT NOTE  Pharmacy Consult for heparin Indication: new AF  Allergies  Allergen Reactions   Strawberry Extract Itching, Swelling and Anaphylaxis    Mouth swells and gets itchy    Patient Measurements: Height: '5\' 5"'$  (165.1 cm) Weight: 104.3 kg (230 lb) IBW/kg (Calculated) : 57 Heparin Dosing Weight: 81.2 kg   Vital Signs: Temp: 98.8 F (37.1 C) (08/29 1443) Temp Source: Oral (08/29 1443) BP: 126/68 (08/29 1443) Pulse Rate: 66 (08/29 1443)  Labs: Recent Labs    11/16/20 1736 11/16/20 2233 11/17/20 0305 11/17/20 1853 11/18/20 0558 11/18/20 1558 11/19/20 0152  HGB 8.9*  --  10.5*  --  10.3*  --  10.2*  HCT 30.5*  --  34.0*  --  33.1*  --  33.6*  PLT 406*  --  325  --  304  --  310  HEPARINUNFRC  --   --   --    < > 0.24* 0.41 0.18*  CREATININE 0.94  --  0.90  --   --   --  0.73  TROPONINIHS 801* 2,073*  --   --   --   --   --    < > = values in this interval not displayed.     Estimated Creatinine Clearance: 80.6 mL/min (by C-G formula based on SCr of 0.73 mg/dL).   Assessment: 70 year old female now postop day 2 status post right lower extremity revascularization with SFA stenting for CLI with tissue loss. Pt admitted after the procedure with a question of a thigh hematoma. CTA was obtained yesterday when she continued to drop her BP and there was no evidence of active arterial bleed or RP hematoma.   Per cardiology, attempting anticoagulation trial in setting of post procedural NSTEMI and new AF. Provider requested no bolus be given.   8/29: s/p cath - successful PCI to proximal LCx. Pharmacy consulted to resume IV heparin 2 hr post TR band removal - TR band off at 18:34. No bleeding noted.  Goal of Therapy:  Heparin level 0.3-0.7 units/ml Monitor platelets by anticoagulation protocol: Yes   Plan:  Resume heparin drip at 1500 units/hr with no bolus at 20:35 6h heparin level Daily heparin level and CBC Monitor for s/sx of bleeding  Thank  you for involving pharmacy in this patient's care.  Renold Genta, PharmD, BCPS Clinical Pharmacist Clinical phone for 11/19/2020 until 10p is [phone] 11/19/2020 2:55 PM  **Pharmacist phone directory can be found on [email or web address] listed under [place]**

## 2020-11-19 NOTE — Interval H&P Note (Signed)
History and Physical Interval Note:  11/19/2020 11:15 AM  Tammy Good  has presented today for surgery, with the diagnosis of NSTEMI.  The various methods of treatment have been discussed with the patient and family. After consideration of risks, benefits and other options for treatment, the patient has consented to  Procedure(s): LEFT HEART CATH AND CORONARY ANGIOGRAPHY (N/A) as a surgical intervention.  The patient's history has been reviewed, patient examined, no change in status, stable for surgery.  I have reviewed the patient's chart and labs.  Questions were answered to the patient's satisfaction.    Cath Lab Visit (complete for each Cath Lab visit)  Clinical Evaluation Leading to the Procedure:   ACS: Yes.    Non-ACS:  N/A  Marshe Shrestha

## 2020-11-19 NOTE — Progress Notes (Signed)
[place] for heparin Indication: chest pain/ACS and new AF  Allergies  Allergen Reactions   Strawberry Extract Itching, Swelling and Anaphylaxis    Mouth swells and gets itchy    Patient Measurements: Height: '5\' 5"'$  (165.1 cm) Weight: 104.3 kg (230 lb) IBW/kg (Calculated) : 57 Heparin Dosing Weight: 81.2 kg   Vital Signs: Temp: 98.8 F (37.1 C) (08/28 1600) Temp Source: Oral (08/28 1600) BP: 135/65 (08/28 1600) Pulse Rate: 81 (08/28 1600)  Labs: Recent Labs    11/16/20 1016 11/16/20 1418 11/16/20 1736 11/16/20 2233 11/17/20 0305 11/17/20 1853 11/18/20 0558 11/18/20 1558 11/19/20 0152  HGB 8.9*  --  8.9*  --  10.5*  --  10.3*  --  10.2*  HCT 29.2*  --  30.5*  --  34.0*  --  33.1*  --  33.6*  PLT 380  --  406*  --  325  --  304  --  310  HEPARINUNFRC  --   --   --   --   --    < > 0.24* 0.41 0.18*  CREATININE 0.87  --  0.94  --  0.90  --   --   --   --   TROPONINIHS 396* 276* 801* 2,073*  --   --   --   --   --    < > = values in this interval not displayed.     Estimated Creatinine Clearance: 71.7 mL/min (by C-G formula based on SCr of 0.9 mg/dL).  Assessment: 70 y.o. female with AFib and NSTEMI for heparin  Goal of Therapy:  Heparin level 0.3-0.7 units/ml Monitor platelets by anticoagulation protocol: Yes   Plan:  Increase Heparin 1500 units/hr  Phillis Knack, PharmD, BCPS

## 2020-11-19 NOTE — Brief Op Note (Signed)
BRIEF CARDIAC CATHETERIZATION NOTE  11/19/2020  1:18 PM  PATIENT:  Tammy Good  70 y.o. female  PRE-OPERATIVE DIAGNOSIS:  NSTEMI  POST-OPERATIVE DIAGNOSIS:  NSTEMI  PROCEDURE:  Procedure(s): LEFT HEART CATH AND CORONARY ANGIOGRAPHY (N/A) CORONARY STENT INTERVENTION (N/A)  SURGEON:  Surgeon(s) and Role:    * Karolina Zamor, MD - Primary  FINDINGS: Severe two vessel coronary artery disease including 95% proximal LCx stenosis and multifocal D1 disease of up to 90%. Mild to moderate LAD and RCA disease. Patent RCA stent. Mildly elevated LVEDP. Challenging but successful PCI to proximal LCx using Onyx Frontier 2.5 x 12 mm DES with 0% residual stenosis and TIMI-3 flow.  RECOMMENDATIONS: Restart IV heparin 2 hours after TR band removal. If no evidence of bleeding or vascular injury, consider transitioning to clopidogrel + DOAC as soon as tomorrow at the discretion of the rounding cardiology team given h/o PAF this admission. Aggressive secondary prevention.  Nelva Bush, MD [facility]

## 2020-11-19 NOTE — Progress Notes (Addendum)
  Progress Note    11/19/2020 7:37 AM 4 Days Post-Op  Subjective:  no complaints this morning  Afebrile HR [id number] NSR [id number] systolic [id number]  Vitals:   11/18/20 1600 11/19/20 0415  BP: 135/65 (!) 151/46  Pulse: 81   Resp: 18 20  Temp: 98.8 F (37.1 C) 98.4 F (36.9 C)  SpO2: 97% 98%    Physical Exam: Cardiac:  regular Lungs:  non labored Incisions:  left groin is soft Extremities:  palpable right DP pulse   CBC    Component Value Date/Time   WBC 8.6 11/19/2020 0152   RBC 3.66 (L) 11/19/2020 0152   HGB 10.2 (L) 11/19/2020 0152   HCT 33.6 (L) 11/19/2020 0152   PLT 310 11/19/2020 0152   MCV 91.8 11/19/2020 0152   MCH 27.9 11/19/2020 0152   MCHC 30.4 11/19/2020 0152   RDW 15.3 11/19/2020 0152   LYMPHSABS 1.5 11/16/2020 1016   MONOABS 1.2 (H) 11/16/2020 1016   EOSABS 0.3 11/16/2020 1016   BASOSABS 0.1 11/16/2020 1016    BMET    Component Value Date/Time   NA 142 11/17/2020 0305   NA 142 01/03/2019 1449   K 3.4 (L) 11/17/2020 0305   CL 110 11/17/2020 0305   CO2 27 11/17/2020 0305   GLUCOSE 164 (H) 11/17/2020 0305   BUN 5 (L) 11/17/2020 0305   BUN 10 01/03/2019 1449   CREATININE 0.90 11/17/2020 0305   CREATININE 0.74 09/16/2013 1344   CALCIUM 8.1 (L) 11/17/2020 0305   GFRNONAA >60 11/17/2020 0305   GFRAA 21 (L) 06/23/2019 1530    INR    Component Value Date/Time   INR 1.1 05/26/2019 0342     Intake/Output Summary (Last 24 hours) at 11/19/2020 0737 Last data filed at 11/19/2020 0600 Gross per 24 hour  Intake 480 ml  Output 1500 ml  Net -1020 ml     Assessment/Plan:  70 y.o. female is s/p:  left transfemoral access with right SFA stenting   4 Days Post-Op   -pt continues to have palpable right DP pulse.   -pt with NSTEMI and for cardiac cath today.  She is on heparin gtt.  -hgb remains stable -cxr yesterday shows resolved central pulmonary congestion  and no evidence of pna or pulmonary edema.   -DVT prophylaxis:  heparin gtt per  cardiology   Leontine Locket, PA-C [facility] [phone] 11/19/2020 7:37 AM  I have seen and evaluated the patient. I agree with the PA note as documented above.  Continues to have a palpable DP pulse in the right foot after right SFA stenting for CLI with tissue loss.  Continue aspirin plavix.  Left groin is nice and soft and tolerating heparin drip for NSTEMI.  Hgb stable.  Plan is tentative cath today with cardiology.  Certainly appreciate their input.  Marty Heck, MD [facility] [place] Office: [phone]

## 2020-11-19 NOTE — H&P (View-Only) (Signed)
Cardiology Progress Note  Patient ID: Tammy Good MRN: [id number] DOB: March 12, 1951 Date of Encounter: 11/19/2020  Primary Cardiologist: Glenetta Hew, MD  Subjective   Chief Complaint: None.  HPI: N.p.o. for left heart catheterization.  Denies any chest pain today.  Hemoglobin appears stable.  ROS:  All other ROS reviewed and negative. Pertinent positives noted in the HPI.     Inpatient Medications  Scheduled Meds:  sodium chloride   Intravenous Once   aspirin EC  81 mg Oral Daily   atorvastatin  40 mg Oral q1800   Chlorhexidine Gluconate Cloth  6 each Topical Q0600   clopidogrel  75 mg Oral Q breakfast   dapagliflozin propanediol  10 mg Oral Daily   dexamethasone  0.5 mg Oral Daily   ferrous sulfate  325 mg Oral Q breakfast   insulin aspart  0-15 Units Subcutaneous TID WC   insulin aspart  0-5 Units Subcutaneous QHS   levothyroxine  175 mcg Oral QAC breakfast   mupirocin ointment  1 application Nasal BID   sodium chloride flush  3 mL Intravenous Q12H   sodium chloride flush  3 mL Intravenous Q12H   Continuous Infusions:  sodium chloride     sodium chloride     sodium chloride 1 mL/kg/hr (11/19/20 0530)   heparin 1,500 Units/hr (11/19/20 0428)   lactated ringers Stopped (11/15/20 2200)   PRN Meds: sodium chloride, sodium chloride, acetaminophen, hydrALAZINE, labetalol, ondansetron (ZOFRAN) IV, sodium chloride flush, sodium chloride flush   Vital Signs   Vitals:   11/18/20 1114 11/18/20 1600 11/19/20 0415 11/19/20 0824  BP: 134/63 135/65 (!) 151/46 136/63  Pulse: 88 81  77  Resp: '16 18 20 19  '$ Temp: 98.3 F (36.8 C) 98.8 F (37.1 C) 98.4 F (36.9 C) 98.1 F (36.7 C)  TempSrc: Oral Oral Oral Oral  SpO2: 97% 97% 98% 95%  Weight:      Height:        Intake/Output Summary (Last 24 hours) at 11/19/2020 0941 Last data filed at 11/19/2020 0800 Gross per 24 hour  Intake 480 ml  Output 2050 ml  Net -1570 ml   Last 3 Weights 11/15/2020 11/06/2020 10/22/2020   Weight (lbs) 230 lb 232 lb 12.8 oz 232 lb  Weight (kg) 104.327 kg 105.597 kg 105.235 kg      Telemetry  Overnight telemetry shows sinus rhythm in the 70s, which I personally reviewed.   ECG  The most recent ECG shows sinus rhythm heart rate 89, old inferior infarct, subtle ST depressions noted the anterior lateral leads, which I personally reviewed.   Physical Exam   Vitals:   11/18/20 1114 11/18/20 1600 11/19/20 0415 11/19/20 0824  BP: 134/63 135/65 (!) 151/46 136/63  Pulse: 88 81  77  Resp: '16 18 20 19  '$ Temp: 98.3 F (36.8 C) 98.8 F (37.1 C) 98.4 F (36.9 C) 98.1 F (36.7 C)  TempSrc: Oral Oral Oral Oral  SpO2: 97% 97% 98% 95%  Weight:      Height:        Intake/Output Summary (Last 24 hours) at 11/19/2020 0941 Last data filed at 11/19/2020 0800 Gross per 24 hour  Intake 480 ml  Output 2050 ml  Net -1570 ml    Last 3 Weights 11/15/2020 11/06/2020 10/22/2020  Weight (lbs) 230 lb 232 lb 12.8 oz 232 lb  Weight (kg) 104.327 kg 105.597 kg 105.235 kg    Body mass index is 38.27 kg/m.  General: Well nourished, well developed, in no  acute distress Head: Atraumatic, normal size  Eyes: PEERLA, EOMI  Neck: Supple, no JVD Endocrine: No thryomegaly Cardiac: Normal S1, S2; RRR; no murmurs, rubs, or gallops Lungs: Clear to auscultation bilaterally, no wheezing, rhonchi or rales  Abd: Soft, nontender, no hepatomegaly  Ext: Right lower extremity in clean dressing, 1+ edema noted, poor pulses noted Musculoskeletal: No deformities, BUE and BLE strength normal and equal Skin: Warm and dry, no rashes   Neuro: Alert and oriented to person, place, time, and situation, CNII-XII grossly intact, no focal deficits  Psych: Normal mood and affect   Labs  High Sensitivity Troponin:   Recent Labs  Lab 11/16/20 1016 11/16/20 1418 11/16/20 1736 11/16/20 2233  TROPONINIHS 396* 276* 801* 2,073*     Cardiac EnzymesNo results for input(s): TROPONINI in the last 168 hours. No results for  input(s): TROPIPOC in the last 168 hours.  Chemistry Recent Labs  Lab 11/16/20 1736 11/17/20 0305 11/19/20 0152  NA 140 142 140  K 4.5 3.4* 3.3*  CL 111 110 106  CO2 20* 27 25  GLUCOSE 170* 164* 134*  BUN 8 5* 5*  CREATININE 0.94 0.90 0.73  CALCIUM 7.7* 8.1* 8.3*  GFRNONAA >60 >60 >60  ANIONGAP '9 5 9    '$ Hematology Recent Labs  Lab 11/17/20 0305 11/18/20 0558 11/19/20 0152  WBC 9.2 9.6 8.6  RBC 3.71* 3.60* 3.66*  HGB 10.5* 10.3* 10.2*  HCT 34.0* 33.1* 33.6*  MCV 91.6 91.9 91.8  MCH 28.3 28.6 27.9  MCHC 30.9 31.1 30.4  RDW 15.9* 15.7* 15.3  PLT 325 304 310   BNP Recent Labs  Lab 11/16/20 1736  BNP 217.2*    DDimer No results for input(s): DDIMER in the last 168 hours.   Radiology  DG CHEST PORT 1 VIEW  Result Date: 11/18/2020 CLINICAL DATA:  Cough. Evaluate pleural effusions, pneumothorax, airspace disease, pneumonia. EXAM: PORTABLE CHEST 1 VIEW COMPARISON:  Chest x-rays dated 11/16/2020 and 08/29/2013. FINDINGS: Stable cardiomegaly. Mild prominence of the pulmonary arteries. Resolved central pulmonary vascular congestion. Lungs are now clear. No pleural effusion or pneumothorax is seen, all the LEFT costophrenic angle is obscured by the enlarged heart. IMPRESSION: 1. Resolved central pulmonary vascular congestion indicating improved fluid status. No evidence of pneumonia or pulmonary edema on today's exam. 2. LEFT costophrenic angle is obscured by the enlarged heart. A small LEFT-sided pleural effusion cannot be excluded. This could be more definitively characterized with a lateral view. 3. Stable cardiomegaly. 4. Mild prominence of the pulmonary arteries suggesting pulmonary arterial hypertension. Electronically Signed   By: Franki Cabot M.D.   On: 11/18/2020 10:17    Cardiac Studies  TTE 11/16/2020  1. Left ventricular ejection fraction, by estimation, is 40 to 45%. The  left ventricle has mildly decreased function. The left ventricle  demonstrates regional wall  motion abnormalities (see scoring  diagram/findings for description). Left ventricular  diastolic parameters are consistent with Grade I diastolic dysfunction  (impaired relaxation). There is severe hypokinesis of the left  ventricular, entire inferolateral wall and inferior wall.   2. Right ventricular systolic function is normal. The right ventricular  size is normal. Tricuspid regurgitation signal is inadequate for assessing  PA pressure.   3. Left atrial size was moderately dilated.   4. The mitral valve is grossly normal. No evidence of mitral valve  regurgitation.   5. The aortic valve was not well visualized. Aortic valve regurgitation  is not visualized.   6. The inferior vena cava is normal in  size with greater than 50%  respiratory variability, suggesting right atrial pressure of 3 mmHg.   Patient Profile  Tammy Good is a 70 y.o. 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  She developed postoperative hypotension on 11/16/2020.  Newly reduced EF.  Elevated troponin concerning for non-STEMI.  Assessment & Plan   Non-STEMI -History of inferior ST elevation myocardial infarction in 2015.  Had an EF around 50% with inferior wall motion abnormalities. -Admitted on 11/15/2020 with critical limb ischemia and underwent right SFA stenting with vascular surgery.  Postoperatively developed hypotension.  Course was complicated by anemia with CT demonstrated no RP bleed or groin hematoma.  Suspect this was just procedurally related. -Troponin was elevated.  EKG without ST elevation. -Suspect her hypotension could have been related to non-STEMI.  EF is now reduced.  I do wonder if she has multivessel CAD.  N.p.o. for left heart cath today. -She remains on aspirin, Plavix and heparin  drip. -Metoprolol was held in setting of hypotension.  She is normotensive.  We will start metoprolol tartrate 25 twice daily. -Continue high intensity statin.  LDL at goal. -A1c 6.7.  She is on an SGLT2 inhibitor.  2. Ischemic cardiomyopathy, EF 40-45% -Concerned that EF drop is related to non-STEMI.  We will hold further fluids. -Euvolemic.  Better idea of volume status at the time of heart cath from LVEDP. -No need for diuresis. -We are transitioning to metoprolol tartrate today.  Likely will transition to ARB as we are able.  She is on an SGLT2 inhibitor.  Likely will start a MRA as we are able.  Again she is had hypotension postoperatively.  We will be cautious with this.  3.  Atrial fibrillation with RVR -Developed brief A. fib when she was hypotensive on 11/16/2020.  Has converted back to sinus rhythm. -Starting beta-blocker as above. -On heparin drip. -Likely will plan for Plavix and DOAC as long as she does not have multivessel CAD.  For now continue heparin drip. -Labs notable for gross hyperthyroidism.  I have adjusted her Synthroid.  See below.  4.  Hypothyroidism now with Iatrogenic hyperthyroidism  -She is on Synthroid supplementation.  TSH is undetectable.  She is on too much Synthroid.  I have reduce this to 125 mcg daily.  This could be contributing to her atrial fibrillation.  We will plan to recheck a TSH in 4 to 6 weeks.  5. Postoperative Anemia -No signs of bleeding.  Suspect this was just a transient loss.  No RP bleed on CT scan.  She has received 1 unit of blood.  Hemoglobin is stable.  Proceed with left heart cath today.  Shared Decision Making/Informed Consent The risks [stroke (1 in 1000), death (1 in 1000), kidney failure [usually temporary] (1 in 500), bleeding (1 in 200), allergic reaction [possibly serious] (1 in 200)], benefits (diagnostic support and management of coronary artery disease) and alternatives of a cardiac catheterization were discussed in detail  with Ms. Valk and she is willing to proceed.  For questions or updates, please contact [place] Please consult [email or web address] for contact info under   Time Spent with Patient: I have spent a total of 35 minutes with patient reviewing hospital notes, telemetry, EKGs, labs and examining the patient as well as establishing an assessment and plan that was discussed with the  patient.  > 50% of time was spent in direct patient care.    Signed, Addison Naegeli. Audie Box, MD, [place]  11/19/2020 9:41 AM

## 2020-11-20 ENCOUNTER — Encounter (HOSPITAL_COMMUNITY): Payer: Self-pay | Admitting: Internal Medicine

## 2020-11-20 DIAGNOSIS — I4891 Unspecified atrial fibrillation: Secondary | ICD-10-CM

## 2020-11-20 DIAGNOSIS — I214 Non-ST elevation (NSTEMI) myocardial infarction: Secondary | ICD-10-CM | POA: Diagnosis not present

## 2020-11-20 LAB — BASIC METABOLIC PANEL
Anion gap: 7 (ref 5–15)
BUN: 7 mg/dL — ABNORMAL LOW (ref 8–23)
CO2: 25 mmol/L (ref 22–32)
Calcium: 8.2 mg/dL — ABNORMAL LOW (ref 8.9–10.3)
Chloride: 108 mmol/L (ref 98–111)
Creatinine, Ser: 0.68 mg/dL (ref 0.44–1.00)
GFR, Estimated: >60 mL/min (ref >60)
Glucose, Bld: 140 mg/dL — ABNORMAL HIGH (ref 70–99)
Potassium: 3.7 mmol/L (ref 3.5–5.1)
Sodium: 140 mmol/L (ref 135–145)

## 2020-11-20 LAB — CBC
HCT: 32.8 % — ABNORMAL LOW (ref 36.0–46.0)
Hemoglobin: 10 g/dL — ABNORMAL LOW (ref 12.0–15.0)
MCH: 28.6 pg (ref 26.0–34.0)
MCHC: 30.5 g/dL (ref 30.0–36.0)
MCV: 93.7 fL (ref 80.0–100.0)
Platelets: 297 10*3/uL (ref 150–400)
RBC: 3.5 MIL/uL — ABNORMAL LOW (ref 3.87–5.11)
RDW: 15.6 % — ABNORMAL HIGH (ref 11.5–15.5)
WBC: 9.2 10*3/uL (ref 4.0–10.5)
nRBC: 0 % (ref 0.0–0.2)

## 2020-11-20 LAB — HEPARIN LEVEL (UNFRACTIONATED)
Heparin Unfractionated: 0.23 IU/mL — ABNORMAL LOW (ref 0.30–0.70)
Heparin Unfractionated: 0.54 IU/mL (ref 0.30–0.70)

## 2020-11-20 LAB — HEPATIC FUNCTION PANEL
ALT: 13 U/L (ref 0–44)
AST: 16 U/L (ref 15–41)
Albumin: 2.4 g/dL — ABNORMAL LOW (ref 3.5–5.0)
Alkaline Phosphatase: 53 U/L (ref 38–126)
Bilirubin, Direct: 0.3 mg/dL — ABNORMAL HIGH (ref 0.0–0.2)
Indirect Bilirubin: 1.2 mg/dL — ABNORMAL HIGH (ref 0.3–0.9)
Total Bilirubin: 1.5 mg/dL — ABNORMAL HIGH (ref 0.3–1.2)
Total Protein: 5.8 g/dL — ABNORMAL LOW (ref 6.5–8.1)

## 2020-11-20 LAB — GLUCOSE, CAPILLARY
Glucose-Capillary: 136 mg/dL — ABNORMAL HIGH (ref 70–99)
Glucose-Capillary: 192 mg/dL — ABNORMAL HIGH (ref 70–99)
Glucose-Capillary: 180 mg/dL — ABNORMAL HIGH (ref 70–99)
Glucose-Capillary: 133 mg/dL — ABNORMAL HIGH (ref 70–99)

## 2020-11-20 MED ORDER — APIXABAN 5 MG PO TABS
5.0000 mg | ORAL_TABLET | Freq: Two times a day (BID) | ORAL | Status: DC
  Administered 2020-11-20 – 2020-11-22 (×5): 5 mg via ORAL
  Filled 2020-11-20 (×5): qty 1

## 2020-11-20 MED ORDER — FUROSEMIDE 10 MG/ML IJ SOLN
40.0000 mg | Freq: Once | INTRAMUSCULAR | Status: AC
  Administered 2020-11-20: 40 mg via INTRAVENOUS
  Filled 2020-11-20: qty 4

## 2020-11-20 MED ORDER — METOPROLOL SUCCINATE ER 25 MG PO TB24
25.0000 mg | ORAL_TABLET | Freq: Every day | ORAL | Status: DC
  Administered 2020-11-20 – 2020-11-22 (×3): 25 mg via ORAL
  Filled 2020-11-20 (×4): qty 1

## 2020-11-20 MED ORDER — LOSARTAN POTASSIUM 25 MG PO TABS
25.0000 mg | ORAL_TABLET | Freq: Every day | ORAL | Status: DC
  Administered 2020-11-20 – 2020-11-22 (×3): 25 mg via ORAL
  Filled 2020-11-20 (×4): qty 1

## 2020-11-20 NOTE — Care Management (Signed)
1209 11-20-20 Case Manager spoke with the patient regarding physical/occupational therapy recommendations. The patient is from home alone and states she she still drives. Patient states she thinks she does well in the home and she is declining home health services at this time. Case Manager did make the patient aware that if she needs further services once she gets home to contact her primary care provider. No further needs from Case Manager at this time.

## 2020-11-20 NOTE — Progress Notes (Addendum)
  Progress Note    11/20/2020 7:19 AM 1 Day Post-Op  Subjective:  says she feels better  Tm 99.3 HR [id number] systolic [id number] RA  Vitals:   11/20/20 0521 11/20/20 0635  BP: 140/70 (!) 141/58  Pulse: 75 69  Resp: 17 15  Temp: 97.9 F (36.6 C) 99.3 F (37.4 C)  SpO2:  100%    Physical Exam: Cardiac:  regular Lungs:  non labored Extremities:  palpable right DP pulse  CBC    Component Value Date/Time   WBC 9.2 11/20/2020 0147   RBC 3.50 (L) 11/20/2020 0147   HGB 10.0 (L) 11/20/2020 0147   HCT 32.8 (L) 11/20/2020 0147   PLT 297 11/20/2020 0147   MCV 93.7 11/20/2020 0147   MCH 28.6 11/20/2020 0147   MCHC 30.5 11/20/2020 0147   RDW 15.6 (H) 11/20/2020 0147   LYMPHSABS 1.5 11/16/2020 1016   MONOABS 1.2 (H) 11/16/2020 1016   EOSABS 0.3 11/16/2020 1016   BASOSABS 0.1 11/16/2020 1016    BMET    Component Value Date/Time   NA 140 11/20/2020 0147   NA 142 01/03/2019 1449   K 3.7 11/20/2020 0147   CL 108 11/20/2020 0147   CO2 25 11/20/2020 0147   GLUCOSE 140 (H) 11/20/2020 0147   BUN 7 (L) 11/20/2020 0147   BUN 10 01/03/2019 1449   CREATININE 0.68 11/20/2020 0147   CREATININE 0.74 09/16/2013 1344   CALCIUM 8.2 (L) 11/20/2020 0147   GFRNONAA >60 11/20/2020 0147   GFRAA 21 (L) 06/23/2019 1530    INR    Component Value Date/Time   INR 1.1 05/26/2019 0342     Intake/Output Summary (Last 24 hours) at 11/20/2020 0719 Last data filed at 11/20/2020 0530 Gross per 24 hour  Intake 1794.53 ml  Output 1652 ml  Net 142.53 ml     Assessment/Plan:  70 y.o. female is s/p:  left transfemoral access with right SFA stenting 5 days post op  And  Cardiac cath   1 Day Post-Op   -pt doing well this morning and continues to have palpable right DP pulse.   -AC per cardiology.  Continue plavix -continue dressing changes. Will change to wet to dry 2x2 saline dressing changes bid.   Leontine Locket, PA-C [facility] [phone] 11/20/2020 7:19 AM  I have seen and evaluated the patient. I agree with the PA note as documented above.  She underwent cardiac cath yesterday and had stent placed in the circumflex.  Appears they want her on DOAC for her A. fib.  Will await cardiology input today.  Looks good from my standpoint.  Right DP palpable and on aspirin Plavix after right SFA stent.  Discharge when okay from cardiology.  Marty Heck, MD [facility] [place] Office: [phone]

## 2020-11-20 NOTE — Progress Notes (Signed)
CARDIAC REHAB PHASE I     MODE:  Ambulation: BSC around bed to recliner   POST:  Rate/Rhythm: 73 SR    BP: sitting 146/67     SaO2: 96 RA  Pt stood independently but held to bed to walk around bed to recliner. Generally weak. Will allow PT to access ambulation later. VSS.  Discussed MI, stent, Plavix, restrictions and diet. Gave brochure for CRPII. Will f/u tomorrow for more education. PT working with pt now. [place], ACSM 11/20/2020 10:33 AM

## 2020-11-20 NOTE — Discharge Instructions (Addendum)
[facility]  Discharge Instructions  Lower Extremity Angiogram; Angioplasty/Stenting  Please refer to the following instructions for your post-procedure care. Your surgeon or physician assistant will discuss any changes with you.  Activity  You may walk as much as you can tolerate. From a cardiac standpoint, no driving for 1 week and no lifting over 10 lbs for 2 weeks. No sexual activity for 2 weeks.    Bathing/Showering  You may shower the day after your procedure. If you have a bandage, you may remove it at 24- 48 hours. Clean your incision site with mild soap and water. Pat the area dry with a clean towel.  Diet  Resume your pre-procedure diet. There are no special food restrictions following this procedure. All patients with peripheral vascular disease should follow a low fat/low cholesterol diet. In order to heal from your surgery, it is CRITICAL to get adequate nutrition. Your body requires vitamins, minerals, and protein. Vegetables are the best source of vitamins and minerals. Vegetables also provide the perfect balance of protein. Processed food has little nutritional value, so try to avoid this.  Medications  Resume taking all of your medications unless your doctor tells you not to. If your incision is causing pain, you may take over-the-counter pain relievers such as acetaminophen (Tylenol)  Follow Up  Follow up will be arranged at the time of your procedure. You may have an office visit scheduled or may be scheduled for surgery. Ask your surgeon if you have any questions.  Please call us immediately for any of the following conditions: Severe or worsening pain your legs or feet at rest or with walking. Increased pain, redness, drainage at your groin puncture site or cath puncture site. Fever of 101 degrees or higher. If you have any mild or slow bleeding from your puncture site: lie down, apply firm constant pressure over the area with  a piece of gauze or a clean wash cloth for 30 minutes- no peeking!, call 911 right away if you are still bleeding after 30 minutes, or if the bleeding is heavy and unmanageable.  Reduce your risk factors of vascular disease:  Stop smoking. If you would like help call [facility] at 1-800-QUIT-NOW 606-507-7376) or [place] at 309-118-8832. Manage your cholesterol Maintain a desired weight Control your diabetes Keep your blood pressure down  If you have any questions, please call the office at [phone]  Information on my medicine - ELIQUIS (apixaban)  This medication education was reviewed with me or my healthcare representative as part of my discharge preparation.   Why was Eliquis prescribed for you? Eliquis was prescribed for you to reduce the risk of a blood clot forming that can cause a stroke if you have a medical condition called atrial fibrillation (a type of irregular heartbeat).  What do You need to know about Eliquis ? Take your Eliquis TWICE DAILY - one tablet in the morning and one tablet in the evening with or without food. If you have difficulty swallowing the tablet whole please discuss with your pharmacist how to take the medication safely.  Take Eliquis exactly as prescribed by your doctor and DO NOT stop taking Eliquis without talking to the doctor who prescribed the medication.  Stopping may increase your risk of developing a stroke.  Refill your prescription before you run out.  After discharge, you should have regular check-up appointments with your healthcare provider that is prescribing your Eliquis.  In the future your dose may need  to be changed if your kidney function or weight changes by a significant amount or as you get older.  What do you do if you miss a dose? If you miss a dose, take it as soon as you remember on the same day and resume taking twice daily.  Do not take more than one dose of ELIQUIS at the same time to make up a missed  dose.  Important Safety Information A possible side effect of Eliquis is bleeding. You should call your healthcare provider right away if you experience any of the following: Bleeding from an injury or your nose that does not stop. Unusual colored urine (red or dark brown) or unusual colored stools (red or black). Unusual bruising for unknown reasons. A serious fall or if you hit your head (even if there is no bleeding).  Some medicines may interact with Eliquis and might increase your risk of bleeding or clotting while on Eliquis. To help avoid this, consult your healthcare provider or pharmacist prior to using any new prescription or non-prescription medications, including herbals, vitamins, non-steroidal anti-inflammatory drugs (NSAIDs) and supplements.  This website has more information on Eliquis (apixaban): http://www.eliquis.com/eliquis/home

## 2020-11-20 NOTE — Progress Notes (Signed)
Physical Therapy Treatment Patient Details Name: Tammy Good MRN: [id number] DOB: [birth year] Today's Date: 11/20/2020    History of Present Illness Patient is a 70 year old female who presented to the hospital with a non-healing foot would. She had a lower extremity angiogram perfromed on 11/16/2020. PMH: CAD, ostemylitius of the left toe; Obesity; NSTMEI 2015; obesity    PT Comments    Pt working with Cardiac Rehab on entry. Pt reports needing to be cleaned up due to urinating before she could get to the [facility]. Pt recently given LASIK and is urinating frequently. In talking with Cardiac Rehab, pr relays that she participated in Cardiac Rehab in 2015 and was happy to do again, however she was limited in her walking with low back and hip pain. Pt reports she was seeing Chiropractor in 2020 for back pain and was seeing results however treatment shortened due to pandemic. Pt requires  Pt ambulates without AD in room with min guard. BSC set up for easy access without assist from staff.  Given improvement in mobility, and pt report of self driving PT recommending Outpatient PT for hip and back pain to decrease pain with longer distance ambulation and enrollment in Cardiac Rehab.     Follow Up Recommendations  Outpatient PT;Other (comment) (Cardiac Rehab)     Equipment Recommendations  None recommended by PT       Precautions / Restrictions Precautions Precaution Comments: Patient is on Heprin. Nursing rpeorts she is still OK to be seen for IE Restrictions Weight Bearing Restrictions: No    Mobility  Bed Mobility               General bed mobility comments: OOB in recliner on entry    Transfers Overall transfer level: Needs assistance   Transfers: Sit to/from Stand;Stand Pivot Transfers Sit to Stand: Modified independent (Device/Increase time) Stand pivot transfers: Supervision       General transfer comment: supervision for power up and self steady, vc for location of IV  line to pivot to toilet, set up [facility] in location where she is able to sit to stand and laterally step to [facility] without worry of tangling with IV  Ambulation/Gait Ambulation/Gait assistance: Min guard Gait Distance (Feet): 60 Feet Assistive device: None Gait Pattern/deviations: WFL(Within Functional Limits) Gait velocity: decreased Gait velocity interpretation: <1.31 ft/sec, indicative of household ambulator General Gait Details: pt with steady ambulation to and from door x3 without AD, pt administered LASIK and ambulation reduced to room due to need for frequent urination          Balance Overall balance assessment: Needs assistance Sitting-balance support: No upper extremity supported;Feet supported Sitting balance-Leahy Scale: Fair Sitting balance - Comments: When patient sat she nearly lost her balance backwards   Standing balance support: Bilateral upper extremity supported Standing balance-Leahy Scale: Poor Standing balance comment: used walker for balance. Also rested on walker when needed                            Cognition Arousal/Alertness: Awake/alert Behavior During Therapy: WFL for tasks assessed/performed Overall Cognitive Status: Within Functional Limits for tasks assessed                                           General Comments General comments (skin integrity, edema, etc.): VSS on RA, HR 76-90bpm frequent  urination due to LASIK      Pertinent Vitals/Pain Pain Assessment: Faces Faces Pain Scale: No hurt     PT Goals (current goals can now be found in the care plan section) Acute Rehab PT Goals Patient Stated Goal: to go home PT Goal Formulation: With patient Time For Goal Achievement: 11/24/20 Potential to Achieve Goals: Good Progress towards PT goals: Progressing toward goals    Frequency    Min 3X/week      PT Plan Discharge plan needs to be updated       AM-PAC PT "6 Clicks" Mobility   Outcome Measure   Help needed turning from your back to your side while in a flat bed without using bedrails?: A Little Help needed moving from lying on your back to sitting on the side of a flat bed without using bedrails?: A Little Help needed moving to and from a bed to a chair (including a wheelchair)?: A Little Help needed standing up from a chair using your arms (e.g., wheelchair or bedside chair)?: A Little Help needed to walk in hospital room?: A Lot Help needed climbing 3-5 steps with a railing? : A Lot 6 Click Score: 16    End of Session Equipment Utilized During Treatment: Gait belt Activity Tolerance: Patient limited by fatigue Patient left: in bed;with call bell/phone within reach;with bed alarm set Nurse Communication: Mobility status PT Visit Diagnosis: Unsteadiness on feet (R26.81);Other abnormalities of gait and mobility (R26.89);Muscle weakness (generalized) (M62.81);Pain Pain - Right/Left: Left Pain - part of body: Leg     Time: [id number] PT Time Calculation (min) (ACUTE ONLY): 33 min  Charges:  $Gait Training: 8-22 mins $Therapeutic Activity: 8-22 mins                     Nefi Musich B. Migdalia Dk PT, DPT Acute Rehabilitation Services Pager [phone] Office [phone]    [place] 11/20/2020, 2:28 PM

## 2020-11-20 NOTE — Progress Notes (Signed)
Tele called and stated "Pt has 15 beats of Vtach". Pt is stable and asymptomatic.

## 2020-11-20 NOTE — Care Management Important Message (Signed)
Important Message  Patient Details  Name: Tammy Good MRN: [id number] Date of Birth: 08-Mar-1951   Medicare Important Message Given:  Yes     [name] 11/20/2020, 10:05 AM

## 2020-11-20 NOTE — Progress Notes (Signed)
ANTICOAGULATION CONSULT NOTE  Pharmacy Consult for heparin Indication: new AF  Allergies  Allergen Reactions   Strawberry Extract Itching, Swelling and Anaphylaxis    Mouth swells and gets itchy    Patient Measurements: Height: '5\' 5"'$  (165.1 cm) Weight: 104.3 kg (230 lb) IBW/kg (Calculated) : 57 Heparin Dosing Weight: 81.2 kg   Vital Signs: Temp: 98.3 F (36.8 C) (08/29 2053) Temp Source: Oral (08/29 2053) BP: 127/101 (08/29 2053) Pulse Rate: 82 (08/29 2053)  Labs: Recent Labs    11/18/20 0558 11/18/20 1558 11/19/20 0152 11/20/20 0147  HGB 10.3*  --  10.2* 10.0*  HCT 33.1*  --  33.6* 32.8*  PLT 304  --  310 297  HEPARINUNFRC 0.24* 0.41 0.18* 0.23*  CREATININE  --   --  0.73 0.68     Estimated Creatinine Clearance: 80.6 mL/min (by C-G formula based on SCr of 0.68 mg/dL).   Assessment: 70 year old female now postop day 2 status post right lower extremity revascularization with SFA stenting for CLI with tissue loss. Pt admitted after the procedure with a question of a thigh hematoma. CTA was obtained yesterday when she continued to drop her BP and there was no evidence of active arterial bleed or RP hematoma.   Per cardiology, attempting anticoagulation trial in setting of post procedural NSTEMI and new AF. Provider requested no bolus be given.   8/29: s/p cath - successful PCI to proximal LCx. Pharmacy consulted to resume IV heparin 2 hr post TR band removal - TR band off at 18:34. No bleeding noted.  8/30 AM update:  Heparin level low Drawn a little early Hgb stable  Goal of Therapy:  Heparin level 0.3-0.7 units/ml Monitor platelets by anticoagulation protocol: Yes   Plan:  Inc heparin drip to 1600 units/hr Re-check heparin level in 8 hours F/U transition to oral anti-coagulation   Narda Bonds, PharmD, [place] Pharmacist Phone: [phone]

## 2020-11-20 NOTE — Progress Notes (Signed)
Heart Failure Nurse Navigator Progress Note  Will assess patient for HV TOC readiness.   EF 40-45%  Pricilla Holm, MSN, RN Heart Failure Nurse Navigator [phone]

## 2020-11-20 NOTE — Progress Notes (Signed)
Cardiology Progress Note  Patient ID: Tammy Good MRN: [id number] DOB: Jan 30, 1951 Date of Encounter: 11/20/2020  Primary Cardiologist: Glenetta Hew, MD  Subjective   Chief Complaint:   HPI:  ROS:  All other ROS reviewed and negative. Pertinent positives noted in the HPI.     Inpatient Medications  Scheduled Meds:  sodium chloride   Intravenous Once   apixaban  5 mg Oral BID   aspirin EC  81 mg Oral Daily   atorvastatin  40 mg Oral q1800   Chlorhexidine Gluconate Cloth  6 each Topical Q0600   clopidogrel  75 mg Oral Q breakfast   dapagliflozin propanediol  10 mg Oral Daily   dexamethasone  0.5 mg Oral Daily   ferrous sulfate  325 mg Oral Q breakfast   insulin aspart  0-15 Units Subcutaneous TID WC   insulin aspart  0-5 Units Subcutaneous QHS   levothyroxine  125 mcg Oral Q0600   losartan  25 mg Oral Daily   metoprolol succinate  25 mg Oral Daily   mupirocin ointment  1 application Nasal BID   pantoprazole  40 mg Oral BID AC   sodium chloride flush  3 mL Intravenous Q12H   sodium chloride flush  3 mL Intravenous Q12H   Continuous Infusions:  sodium chloride     sodium chloride     PRN Meds: sodium chloride, sodium chloride, acetaminophen, labetalol, ondansetron (ZOFRAN) IV, sodium chloride flush, sodium chloride flush   Vital Signs   Vitals:   11/20/20 0521 11/20/20 0635 11/20/20 0739 11/20/20 0900  BP: 140/70 (!) 141/58 134/71 133/71  Pulse: 75 69 71 72  Resp: '17 15 17 16  '$ Temp: 97.9 F (36.6 C) 99.3 F (37.4 C) 98.2 F (36.8 C) 98.6 F (37 C)  TempSrc: Oral Oral Oral Oral  SpO2:  100% 93% 96%  Weight:      Height:        Intake/Output Summary (Last 24 hours) at 11/20/2020 1151 Last data filed at 11/20/2020 1000 Gross per 24 hour  Intake 2044.53 ml  Output 2302 ml  Net -257.47 ml   Last 3 Weights 11/15/2020 11/06/2020 10/22/2020  Weight (lbs) 230 lb 232 lb 12.8 oz 232 lb  Weight (kg) 104.327 kg 105.597 kg 105.235 kg      Telemetry  Overnight  telemetry shows sinus rhythm in the 60s, which I personally reviewed.   ECG  The most recent ECG shows sinus rhythm, inferior infarct, anterolateral infarct, which I personally reviewed.   Physical Exam   Vitals:   11/20/20 0521 11/20/20 0635 11/20/20 0739 11/20/20 0900  BP: 140/70 (!) 141/58 134/71 133/71  Pulse: 75 69 71 72  Resp: '17 15 17 16  '$ Temp: 97.9 F (36.6 C) 99.3 F (37.4 C) 98.2 F (36.8 C) 98.6 F (37 C)  TempSrc: Oral Oral Oral Oral  SpO2:  100% 93% 96%  Weight:      Height:        Intake/Output Summary (Last 24 hours) at 11/20/2020 1151 Last data filed at 11/20/2020 1000 Gross per 24 hour  Intake 2044.53 ml  Output 2302 ml  Net -257.47 ml    Last 3 Weights 11/15/2020 11/06/2020 10/22/2020  Weight (lbs) 230 lb 232 lb 12.8 oz 232 lb  Weight (kg) 104.327 kg 105.597 kg 105.235 kg    Body mass index is 38.27 kg/m.   General: Well nourished, well developed, in no acute distress Head: Atraumatic, normal size  Eyes: PEERLA, EOMI  Neck: Supple, no  JVD Endocrine: No thryomegaly Cardiac: Normal S1, S2; RRR; no murmurs, rubs, or gallops Lungs: Clear to auscultation bilaterally, no wheezing, rhonchi or rales  Abd: Soft, nontender, no hepatomegaly  Ext: No edema in the lower extremities, 2+ radial pulse, poor pulses in lower extremities Musculoskeletal: No deformities, BUE and BLE strength normal and equal Skin: Warm and dry, no rashes   Neuro: Alert and oriented to person, place, time, and situation, CNII-XII grossly intact, no focal deficits  Psych: Normal mood and affect   Labs  High Sensitivity Troponin:   Recent Labs  Lab 11/16/20 1016 11/16/20 1418 11/16/20 1736 11/16/20 2233  TROPONINIHS 396* 276* 801* 2,073*     Cardiac EnzymesNo results for input(s): TROPONINI in the last 168 hours. No results for input(s): TROPIPOC in the last 168 hours.  Chemistry Recent Labs  Lab 11/17/20 0305 11/19/20 0152 11/20/20 0147  NA 142 140 140  K 3.4* 3.3* 3.7  CL  110 106 108  CO2 '27 25 25  '$ GLUCOSE 164* 134* 140*  BUN 5* 5* 7*  CREATININE 0.90 0.73 0.68  CALCIUM 8.1* 8.3* 8.2*  GFRNONAA >60 >60 >60  ANIONGAP '5 9 7    '$ Hematology Recent Labs  Lab 11/18/20 0558 11/19/20 0152 11/20/20 0147  WBC 9.6 8.6 9.2  RBC 3.60* 3.66* 3.50*  HGB 10.3* 10.2* 10.0*  HCT 33.1* 33.6* 32.8*  MCV 91.9 91.8 93.7  MCH 28.6 27.9 28.6  MCHC 31.1 30.4 30.5  RDW 15.7* 15.3 15.6*  PLT 304 310 297   BNP Recent Labs  Lab 11/16/20 1736  BNP 217.2*    DDimer No results for input(s): DDIMER in the last 168 hours.   Radiology  CARDIAC CATHETERIZATION  Result Date: 11/20/2020 Conclusions: Severe two-vessel coronary artery disease including 95% proximal LCx stenosis and multifocal D1 disease of up to 90%. Mild to moderate LAD and RCA disease. Patent RCA stent. Mildly elevated left ventricular filling pressure (LVEDP ~20 mmHg). Challenging but successful PCI to proximal LCx using Onyx Frontier 2.5 x 12 mm DES with 0% residual stenosis and TIMI-3 flow.  Recommendations: Restart IV heparin 2 hours after TR band removal. If no evidence of bleeding or vascular injury, consider transitioning to clopidogrel + DOAC as soon as tomorrow at the discretion of the rounding cardiology team given h/o PAF this admission. Aggressive secondary prevention. Medical management of diagonal disease, as the diseased portions of the vessel are too small for PCI. Nelva Bush, MD [facility]     Cardiac Studies  TTE 11/16/2020  1. Left ventricular ejection fraction, by estimation, is 40 to 45%. The  left ventricle has mildly decreased function. The left ventricle  demonstrates regional wall motion abnormalities (see scoring  diagram/findings for description). Left ventricular  diastolic parameters are consistent with Grade I diastolic dysfunction  (impaired relaxation). There is severe hypokinesis of the left  ventricular, entire inferolateral wall and inferior wall.   2. Right  ventricular systolic function is normal. The right ventricular  size is normal. Tricuspid regurgitation signal is inadequate for assessing  PA pressure.   3. Left atrial size was moderately dilated.   4. The mitral valve is grossly normal. No evidence of mitral valve  regurgitation.   5. The aortic valve was not well visualized. Aortic valve regurgitation  is not visualized.   6. The inferior vena cava is normal in size with greater than 50%  respiratory variability, suggesting right atrial pressure of 3 mmHg.   LHC 11/19/2020 Conclusions: Severe two-vessel coronary artery  disease including 95% proximal LCx stenosis and multifocal D1 disease of up to 90%. Mild to moderate LAD and RCA disease. Patent RCA stent. Mildly elevated left ventricular filling pressure (LVEDP ~20 mmHg). Challenging but successful PCI to proximal LCx using Onyx Frontier 2.5 x 12 mm DES with 0% residual stenosis and TIMI-3 flow.   Recommendations: Restart IV heparin 2 hours after TR band removal. If no evidence of bleeding or vascular injury, consider transitioning to clopidogrel + DOAC as soon as tomorrow at the discretion of the rounding cardiology team given h/o PAF this admission. Aggressive secondary prevention. Medical management of diagonal disease, as the diseased portions of the vessel are too small for PCI.    Patient Profile   Tammy Good is a 70 y.o. 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  She developed postoperative hypotension on 11/16/2020.  Newly reduced EF.  Elevated troponin concerning for non-STEMI.  Assessment & Plan   Non-STEMI -Had non-STEMI after right R SFA stenting due to critical limb ischemia on 11/15/2020. -Underwent left heart cath yesterday which demonstrated severe stenosis in the  proximal circumflex -90% stenosis in first diagonal lesion that has been recommended to be managed medically. -Did have atrial fibrillation this admission.  Would prefer Plavix and Eliquis.  We will discuss with vascular surgery that we would like to avoid triple therapy if able. -Transition to metoprolol succinate 25 mg daily. -Continue high intensity statin -SGLT2 inhibitor added -Right radial site clean and dry.  Stable for discharge from cardiovascular standpoint.  We will discuss with vascular surgery and arrange hospital follow-up if she goes today.  2.  Ischemic or myopathy, EF 40-45% -LVEDP was elevated at 20.  She was given a one-time dose of Lasix. -No clinical symptoms of heart failure. -Transition to metoprolol succinate 25 mg daily.  Add losartan 25 mg daily.  She is on Farxiga 10 mg daily -Aldactone can be added as an outpatient  3.  Atrial fibrillation with RVR -Secondary to hypotension and likely non-STEMI.  Back in sinus rhythm. -Transition to Eliquis 5 mg twice daily. -Hope to plan for Plavix and DOAC and avoid aspirin if vascular surgery is okay with this.  4.  Hypothyroidism now with iatrogenic hyperthyroidism -Synthroid medication was adjusted.  She was on too much medication.  This will need to be rechecked in 4 to 6 weeks.  5.  Postoperative anemia -Resolved.  For questions or updates, please contact [facility] Please consult [email or web address] for contact info under   Time Spent with Patient: I have spent a total of 35 minutes with patient reviewing hospital notes, telemetry, EKGs, labs and examining the patient as well as establishing an assessment and plan that was discussed with the patient.  > 50% of time was spent in direct patient care.    Signed, Addison Naegeli. Audie Box, MD, [place]  11/20/2020 11:51 AM

## 2020-11-21 ENCOUNTER — Other Ambulatory Visit: Payer: Self-pay

## 2020-11-21 ENCOUNTER — Other Ambulatory Visit (HOSPITAL_COMMUNITY): Payer: Self-pay

## 2020-11-21 ENCOUNTER — Inpatient Hospital Stay (HOSPITAL_COMMUNITY): Payer: Medicare Other

## 2020-11-21 DIAGNOSIS — I739 Peripheral vascular disease, unspecified: Secondary | ICD-10-CM

## 2020-11-21 DIAGNOSIS — I2510 Atherosclerotic heart disease of native coronary artery without angina pectoris: Secondary | ICD-10-CM

## 2020-11-21 DIAGNOSIS — I214 Non-ST elevation (NSTEMI) myocardial infarction: Secondary | ICD-10-CM | POA: Diagnosis not present

## 2020-11-21 DIAGNOSIS — Z006 Encounter for examination for normal comparison and control in clinical research program: Secondary | ICD-10-CM

## 2020-11-21 LAB — CBC
HCT: 35.2 % — ABNORMAL LOW (ref 36.0–46.0)
Hemoglobin: 11 g/dL — ABNORMAL LOW (ref 12.0–15.0)
MCH: 28.4 pg (ref 26.0–34.0)
MCHC: 31.3 g/dL (ref 30.0–36.0)
MCV: 91 fL (ref 80.0–100.0)
Platelets: 342 10*3/uL (ref 150–400)
RBC: 3.87 MIL/uL (ref 3.87–5.11)
RDW: 15.5 % (ref 11.5–15.5)
WBC: 11.1 10*3/uL — ABNORMAL HIGH (ref 4.0–10.5)
nRBC: 0 % (ref 0.0–0.2)

## 2020-11-21 LAB — BASIC METABOLIC PANEL
Anion gap: 8 (ref 5–15)
BUN: 9 mg/dL (ref 8–23)
CO2: 27 mmol/L (ref 22–32)
Calcium: 8.4 mg/dL — ABNORMAL LOW (ref 8.9–10.3)
Chloride: 103 mmol/L (ref 98–111)
Creatinine, Ser: 0.69 mg/dL (ref 0.44–1.00)
GFR, Estimated: >60 mL/min (ref >60)
Glucose, Bld: 124 mg/dL — ABNORMAL HIGH (ref 70–99)
Potassium: 3.3 mmol/L — ABNORMAL LOW (ref 3.5–5.1)
Sodium: 138 mmol/L (ref 135–145)

## 2020-11-21 LAB — GLUCOSE, CAPILLARY
Glucose-Capillary: 131 mg/dL — ABNORMAL HIGH (ref 70–99)
Glucose-Capillary: 145 mg/dL — ABNORMAL HIGH (ref 70–99)
Glucose-Capillary: 195 mg/dL — ABNORMAL HIGH (ref 70–99)
Glucose-Capillary: 170 mg/dL — ABNORMAL HIGH (ref 70–99)

## 2020-11-21 MED ORDER — LOSARTAN POTASSIUM 25 MG PO TABS: 25.0000 mg | ORAL_TABLET | Freq: Every day | ORAL | 11 refills | Status: DC

## 2020-11-21 MED ORDER — SPIRONOLACTONE 25 MG PO TABS
25.0000 mg | ORAL_TABLET | Freq: Every day | ORAL | Status: DC
  Administered 2020-11-21 – 2020-11-22 (×2): 25 mg via ORAL
  Filled 2020-11-21 (×2): qty 1

## 2020-11-21 MED ORDER — NITROGLYCERIN 0.4 MG SL SUBL: 0.4000 mg | SUBLINGUAL_TABLET | SUBLINGUAL | 3 refills | Status: DC | PRN

## 2020-11-21 MED ORDER — APIXABAN 5 MG PO TABS: 5.0000 mg | ORAL_TABLET | Freq: Two times a day (BID) | ORAL | 6 refills | Status: DC

## 2020-11-21 MED ORDER — LEVOTHYROXINE SODIUM 125 MCG PO TABS: 125.0000 ug | ORAL_TABLET | Freq: Every day | ORAL | 2 refills | Status: AC

## 2020-11-21 MED ORDER — MENTHOL 3 MG MT LOZG
1.0000 | LOZENGE | OROMUCOSAL | Status: DC | PRN
  Administered 2020-11-21 – 2020-11-22 (×2): 3 mg via ORAL
  Filled 2020-11-21: qty 9

## 2020-11-21 MED ORDER — METOPROLOL SUCCINATE ER 25 MG PO TB24: 25.0000 mg | ORAL_TABLET | Freq: Every day | ORAL | 11 refills | Status: DC

## 2020-11-21 MED ORDER — SPIRONOLACTONE 25 MG PO TABS: 25.0000 mg | ORAL_TABLET | Freq: Every day | ORAL | 0 refills | Status: DC

## 2020-11-21 MED ORDER — POTASSIUM CHLORIDE CRYS ER 20 MEQ PO TBCR
40.0000 meq | EXTENDED_RELEASE_TABLET | Freq: Once | ORAL | Status: AC
  Administered 2020-11-21: 40 meq via ORAL
  Filled 2020-11-21: qty 2

## 2020-11-21 NOTE — Care Management (Signed)
11-21-20 [id number] Benefits check submitted for Eliquis. Case Manager will follow for cost.

## 2020-11-21 NOTE — Discharge Summary (Signed)
Discharge Summary  Patient ID: Tammy Good [id number] 70 y.o. 09/07/1950  Admit date: 11/15/2020  Discharge date and time: 11/21/2020   Admitting Physician: Marty Heck, MD   Discharge Physician: Monica Martinez, MD  Admission Diagnoses: PAD (peripheral artery disease) [facility]) [I73.9]  Discharge Diagnoses: PAD  Admission Condition: fair  Discharged Condition: good  Indication for Admission: Patient is a 70 year old female well-known to vascular surgery who has previously undergone left leg intervention for tissue loss.  She presented with nonhealing right calf wound with known peripheral arterial disease.  She presents today for lower extremity arteriogram and possible intervention after risks and benefits discussed.  Hospital Course: Patient was admitted on 8/25 and underwent Aortogram, right lower extremity arteriogram with selection of third order branches, right mid to distal SFA and above knee popliteal artery angioplasty with stent by [name]. She tolerated the procedure well and was transferred to the recovery room in stable condition. In the recovery room rapid response was called, patient was found to have left groin hematoma. patient was found to be dyspneic and losing consciousness. CBC was ordered, 500 cc bolus was given and she was admitted for overnight observation. Pressure hemostasis was obtained. She remained slightly hypotensive. Subsequent fluid bolus given. She remained hemodynamically stable.   By POD #1, left groin hematoma soft. Her bilateral lower extremities well perfused with DP signals. Remained hemodynamically stable. Tolerating diet. Continued on Aspirin and statin. Later in the day she developed hypotension again. 1L fluid bolus ordered as well as CTA and CBC. CTA came back negative for RP bleed. Cardiac workup initiated with EKG, troponin's and cardiology was consulted. Repeat CBC showed hgb of 8 down from 12.9 on admission. Noted to have new  atrial fibrillation with mild RVR on EKG. Echo ordered.  Initial troponin resulted as elevated. Home antihypertensives held. Echo later showed decrease in Ejection fraction and WMA. Held off on cath due to hypotension and recent bleed. Patient transfused 2 units.   POD#2 she did well overnight. Overall feeling well. Blood pressure remained soft in the low 100's. Left groin hematoma soft. RLE well perfused and warm with doppler DP.  Hemoglobin responded appropriately to transfusion up to 10.5. Otherwise hemodynamically stable. Patient initiated on Heparin per Cardiology for atrial fibrillation. Evaluated by PT with recommendations for Home health  POD #3 overall improved. Right leg well perfused with palpable right DP. Acute blood loss anemia stable following transfusions. Left groin hematoma stable. Continued on Heparin. Atrial fibrillation resolved. New productive cough. Chest xray ordered. Cardiology discussed plan for cardiac cath with patient from radial approach for POD#4. Orders for NPO placed  POD #4 right leg remained well perfused with palpable DP. Left groin stable. Continued on heparin. Hemodynamically stable. Continued also on Aspirin and plavix. She underwent cardiac catheterization showing severe two vessel CAD with 95% LCx stenosis, and multifocal D1 disease up to 90%, mild to moderate LAD and RCA disease, patent RCA stent, mildly elevated LVEDP. Successful PCI to the proximal LCx was performed with 0% residual stenosis. Post procedural recommendations were made for continued IV heparin, plavix + DOAC.  Patient tolerated procedure well and was transferred back to her room  POD #5 overall doing well from cardiac and vascular standpoint. Continued adequate perfusion of right lower extremity with palpable DP. Left groin stable. Hemodynamically stable. Transitioned to metoprolol succinate 25 mg daily, SGLT2 added, losartan and Eliquis started per Cardiology. Continued on high dose statin and  plavix  POD #6, patient overall stable. The  remainder of her hospital course consisted of local wound care and increased ambulation. Again PT/ OT recommending home health services but patient felt this was not necessary. Patient stable for discharge. All discharge arrangements made. Patient later on working with PT became lightheaded with palpitations. Also complaining of shortness of breath. Her vitals remained stable. Chest xray, cbc and EKG obtained. No acute changes noted. Patient remained stable for discharge but felt more comfortable staying another night so her discharge was cancelled.   POD #7, patient feeling better overall. She remained stable for discharge home. Outpatient PT arranged. PDMP reviewed and post operative pain medication as well as new prescription for metoprolol, Eliquis, losartan, and Aldactone sent to patients requested pharmacy. Follow up arranged was arranged with [name] in 6 weeks with non invasive vascular lab studies. She also has outpatient follow up with Cardiology on 11/27/20.  Consults: cardiology and wound care  Treatments: cardiac meds: Losartan and metoprolol, anticoagulation: Plavix and Eliquis, therapies: PT and OT, and surgery: Aortogram, right lower extremity arteriogram with mid to distal SFA and above knee popliteal artery angioplasty and stent, and left heart cath and coronary angiography with stenting   Disposition: Discharge disposition: 01-Home or Self Care       Patient Instructions:  Allergies as of 11/21/2020       Reactions   Strawberry Extract Itching, Swelling, Anaphylaxis   Mouth swells and gets itchy        Medication List     STOP taking these medications    aspirin EC 81 MG tablet   lisinopril 10 MG tablet Commonly known as: ZESTRIL   lisinopril 30 MG tablet Commonly known as: ZESTRIL   metoprolol tartrate 25 MG tablet Commonly known as: LOPRESSOR       TAKE these medications    apixaban 5 MG Tabs  tablet Commonly known as: ELIQUIS Take 1 tablet (5 mg total) by mouth 2 (two) times daily.   atorvastatin 40 MG tablet Commonly known as: LIPITOR Take 1 tablet (40 mg total) by mouth daily at 6 PM.   B-D UF III MINI PEN NEEDLES 31G X 5 MM Misc Generic drug: Insulin Pen Needle Inject into the skin.   Basaglar KwikPen 100 UNIT/ML Inject 48 Units into the skin at bedtime.   clopidogrel 75 MG tablet Commonly known as: PLAVIX Take 1 tablet by mouth once daily with breakfast   dexamethasone 0.5 MG tablet Commonly known as: DECADRON Take 0.5 mg by mouth daily.   Farxiga 10 MG Tabs tablet Generic drug: dapagliflozin propanediol Take 10 mg by mouth daily.   ferrous sulfate 325 (65 FE) MG tablet Take 325 mg by mouth daily with breakfast.   Fish Oil 1000 MG Caps Take 1,000 mg by mouth daily.   FreeStyle Libre 2 Sensor Misc Apply topically every 14 (fourteen) days.   levothyroxine 125 MCG tablet Commonly known as: SYNTHROID Take 1 tablet (125 mcg total) by mouth daily before breakfast. What changed:  medication strength how much to take   losartan 25 MG tablet Commonly known as: COZAAR Take 1 tablet (25 mg total) by mouth daily.   metoprolol succinate 25 MG 24 hr tablet Commonly known as: TOPROL-XL Take 1 tablet (25 mg total) by mouth daily.   nitroGLYCERIN 0.4 MG SL tablet Commonly known as: Nitrostat Place 1 tablet (0.4 mg total) under the tongue every 5 (five) minutes as needed for chest pain (up to 3 doses. If taking 3rd dose call 911).   NovoLOG FlexPen 100  UNIT/ML FlexPen Generic drug: insulin aspart Inject 5-15 Units into the skin 3 (three) times daily with meals. Per sliding scale   nystatin cream Commonly known as: MYCOSTATIN Apply 1 application topically 2 (two) times daily. What changed:  when to take this reasons to take this   OneTouch Verio test strip Generic drug: glucose blood   pantoprazole 40 MG tablet Commonly known as: Protonix Take 1  tablet (40 mg total) by mouth 2 (two) times daily before a meal.   silver sulfADIAZINE 1 % cream Commonly known as: Silvadene Apply 1 application topically daily. Apply thin layer to leg wound daily   spironolactone 25 MG tablet Commonly known as: ALDACTONE Take 1 tablet (25 mg total) by mouth daily.   Systane Complete 0.6 % Soln Generic drug: Propylene Glycol Place 1 drop into both eyes daily as needed (dry eyes).               Discharge Care Instructions  (From admission, onward)           Start     Ordered   11/21/20 0000  Discharge wound care:       Comments: Clean wound with mild soap and water and pat dry. Apply silvadene cream, dry gauze and secure with kerlix gauze roll   11/21/20 0835           Activity: activity as tolerated and no driving while on analgesics Diet: cardiac diet and low fat, low cholesterol diet Wound Care: keep wound clean and dry and as directed  Follow-up with [name] in 6 weeks with non invasive vascular lab studies.  SignedKaroline Caldwell, PA-C 11/21/2020 10:33 AM [facility] Office: [phone]

## 2020-11-21 NOTE — Progress Notes (Addendum)
Progress Note  Patient Name: Tammy Good Date of Encounter: 11/21/2020  Primary Cardiologist: Glenetta Hew, MD  Subjective   Feeling great. No complaints. No Cp or SOB. Feels ready to go home.  Inpatient Medications    Scheduled Meds:  sodium chloride   Intravenous Once   apixaban  5 mg Oral BID   atorvastatin  40 mg Oral q1800   clopidogrel  75 mg Oral Q breakfast   dapagliflozin propanediol  10 mg Oral Daily   dexamethasone  0.5 mg Oral Daily   ferrous sulfate  325 mg Oral Q breakfast   insulin aspart  0-15 Units Subcutaneous TID WC   insulin aspart  0-5 Units Subcutaneous QHS   levothyroxine  125 mcg Oral Q0600   losartan  25 mg Oral Daily   metoprolol succinate  25 mg Oral Daily   mupirocin ointment  1 application Nasal BID   pantoprazole  40 mg Oral BID AC   sodium chloride flush  3 mL Intravenous Q12H   sodium chloride flush  3 mL Intravenous Q12H   spironolactone  25 mg Oral Daily   Continuous Infusions:  sodium chloride     sodium chloride     PRN Meds: sodium chloride, sodium chloride, acetaminophen, labetalol, ondansetron (ZOFRAN) IV, sodium chloride flush, sodium chloride flush   Vital Signs    Vitals:   11/20/20 1202 11/20/20 1500 11/20/20 2118 11/21/20 0408  BP: (!) 143/64 131/66 122/62 (!) 150/56  Pulse: 63 68 72 70  Resp: '18 16  18  '$ Temp: 98 F (36.7 C) 98.7 F (37.1 C) 98 F (36.7 C) 98.2 F (36.8 C)  TempSrc: Oral Oral Oral Oral  SpO2:  99%    Weight:      Height:        Intake/Output Summary (Last 24 hours) at 11/21/2020 0806 Last data filed at 11/21/2020 [id number] Gross per 24 hour  Intake 850 ml  Output 3000 ml  Net -2150 ml   Last 3 Weights 11/15/2020 11/06/2020 10/22/2020  Weight (lbs) 230 lb 232 lb 12.8 oz 232 lb  Weight (kg) 104.327 kg 105.597 kg 105.235 kg     Telemetry    NSR, occasional PVCs, one couplet - Personally Reviewed  Physical Exam   GEN: No acute distress.  HEENT: Normocephalic, atraumatic, sclera  non-icteric. Neck: No JVD or bruits. Cardiac: RRR no murmurs, rubs, or gallops.  Respiratory: Clear to auscultation bilaterally. Breathing is unlabored. GI: Soft, nontender, non-distended, BS +x 4. MS: no deformity. Extremities: No clubbing or cyanosis. No edema. Distal pedal pulses are 2+ and equal bilaterally. Right radial cath site without hematoma or ecchymosis; good pulse. Neuro:  AAOx3. Follows commands. Psych:  Responds to questions appropriately with a normal affect.  Labs    High Sensitivity Troponin:   Recent Labs  Lab 11/16/20 1016 11/16/20 1418 11/16/20 1736 11/16/20 2233  TROPONINIHS 396* 276* 801* 2,073*      Cardiac EnzymesNo results for input(s): TROPONINI in the last 168 hours. No results for input(s): TROPIPOC in the last 168 hours.   Chemistry Recent Labs  Lab 11/19/20 0152 11/20/20 0147 11/21/20 0145  NA 140 140 138  K 3.3* 3.7 3.3*  CL 106 108 103  CO2 '25 25 27  '$ GLUCOSE 134* 140* 124*  BUN 5* 7* 9  CREATININE 0.73 0.68 0.69  CALCIUM 8.3* 8.2* 8.4*  PROT  --  5.8*  --   ALBUMIN  --  2.4*  --   AST  --  16  --   ALT  --  13  --   ALKPHOS  --  53  --   BILITOT  --  1.5*  --   GFRNONAA >60 >60 >60  ANIONGAP '9 7 8     '$ Hematology Recent Labs  Lab 11/19/20 0152 11/20/20 0147 11/21/20 0145  WBC 8.6 9.2 11.1*  RBC 3.66* 3.50* 3.87  HGB 10.2* 10.0* 11.0*  HCT 33.6* 32.8* 35.2*  MCV 91.8 93.7 91.0  MCH 27.9 28.6 28.4  MCHC 30.4 30.5 31.3  RDW 15.3 15.6* 15.5  PLT 310 297 342    BNP Recent Labs  Lab 11/16/20 1736  BNP 217.2*     DDimer No results for input(s): DDIMER in the last 168 hours.   Radiology    CARDIAC CATHETERIZATION  Result Date: 11/20/2020 Conclusions: Severe two-vessel coronary artery disease including 95% proximal LCx stenosis and multifocal D1 disease of up to 90%. Mild to moderate LAD and RCA disease. Patent RCA stent. Mildly elevated left ventricular filling pressure (LVEDP ~20 mmHg). Challenging but successful  PCI to proximal LCx using Onyx Frontier 2.5 x 12 mm DES with 0% residual stenosis and TIMI-3 flow.  Recommendations: Restart IV heparin 2 hours after TR band removal. If no evidence of bleeding or vascular injury, consider transitioning to clopidogrel + DOAC as soon as tomorrow at the discretion of the rounding cardiology team given h/o PAF this admission. Aggressive secondary prevention. Medical management of diagonal disease, as the diseased portions of the vessel are too small for PCI. Nelva Bush, MD [facility]     Cardiac Studies   TTE 11/16/2020  1. Left ventricular ejection fraction, by estimation, is 40 to 45%. The  left ventricle has mildly decreased function. The left ventricle  demonstrates regional wall motion abnormalities (see scoring  diagram/findings for description). Left ventricular  diastolic parameters are consistent with Grade I diastolic dysfunction  (impaired relaxation). There is severe hypokinesis of the left  ventricular, entire inferolateral wall and inferior wall.   2. Right ventricular systolic function is normal. The right ventricular  size is normal. Tricuspid regurgitation signal is inadequate for assessing  PA pressure.   3. Left atrial size was moderately dilated.   4. The mitral valve is grossly normal. No evidence of mitral valve  regurgitation.   5. The aortic valve was not well visualized. Aortic valve regurgitation  is not visualized.   6. The inferior vena cava is normal in size with greater than 50%  respiratory variability, suggesting right atrial pressure of 3 mmHg.    LHC 11/19/2020 Conclusions: Severe two-vessel coronary artery disease including 95% proximal LCx stenosis and multifocal D1 disease of up to 90%. Mild to moderate LAD and RCA disease. Patent RCA stent. Mildly elevated left ventricular filling pressure (LVEDP ~20 mmHg). Challenging but successful PCI to proximal LCx using Onyx Frontier 2.5 x 12 mm DES with 0% residual  stenosis and TIMI-3 flow.   Recommendations: Restart IV heparin 2 hours after TR band removal. If no evidence of bleeding or vascular injury, consider transitioning to clopidogrel + DOAC as soon as tomorrow at the discretion of the rounding cardiology team given h/o PAF this admission. Aggressive secondary prevention. Medical management of diagonal disease, as the diseased portions of the vessel are too small for PCI.        Patient Profile     70 y.o. female with CAD with inferior STEMI 2015 c/b shock with PCI to RCA and prox R PLA, PAD c/b  critical limb ischemia, HLD, HTN, DM, thyroid disease, osteomyelitis, obesity. She was admitted 11/15/20 for critical limb ischemia and R SFA stenting. She developed postoperative hypotension 11/16/20 with newly reduced LV function and NSTEMI. Underwent cardiac cath 11/19/20 s/p PCI/DES to prox Cx. Also developed AF RVR felt secondary to hypotension and NSTEMI.  Assessment & Plan    1. CAD/NSTEMI - underwent LHC 11/19/20 s/p DES to prox LCx, residual disease treated medically (multifocal D1 disease of up to 90%, mild-moderate RCA disease) - plan for Plavix + Eliquis per notes - Eliquis restarted - no ASA - avoiding triple therapy due to bleeding risk - continue statin, beta blocker - will rx SL NTG at DC - cardiac rehab to see today per notes yesterday - sent msg to nurse to make her aware  2. Ischemic cardiomyopathy/acute systolic CHF - no clinical symptoms of CHF - was given 1 time dose of Lasix this admission - Lopressor changed to Toprol - home lisinopril discontinued, changed to losartan (AVS already reconciled - I discontinued) - SGLT2i added - spironolactone added today - has close f/u with [facility] clinic at which time labs can be rechecked  3. New onset atrial fibrillation - suspected due to hypotension, NSTEMI, +/- contribution of thyroid disease - back in NSR - continue Eliquis at discharge - will need OP f/u of thyroid function  4.  Hypothyroidism - levothyroxine medication adjusted this admission due to suppressed TSH, will need OP f/u with primary care  5. Post-operative anemia - improved  6. PVCS/NSVT - telemetry 8/30 showed 2 episodes of NSVT, 14 and 15 beats - today only rare PVCs, one PVC couplet - Mg OK - K 3.3 -> replete today - continue BB  7. Hypokalemia - give KCl 44mq x1 - otherwise starting spironolactone today so would hold off standing repletion - [facility]  TOC HF clinic f/u scheduled 9/6 Also arranged gen cards f/u 9/23 I also edited d/c instructions to include standard post-NSTEMI instructions and added SL NTG rx Will confirm with nurse that AVS not yet printed  For questions or updates, please contact [facility] Please consult [email or web address] for contact info under Cardiology/STEMI.  Signed, DCharlie Pitter PA-C 11/21/2020, 8:06 AM

## 2020-11-21 NOTE — Progress Notes (Addendum)
Notified by nurse that patient reports several complaints prior to dc. She states she felt somewhat lightheaded with ambulation with cardiac rehab. She describes this as not necessarily dizziness or vertigo but sensation of feeling "loopy." She also feels this is exacerbated by taking a deep breath. She also feels like when she takes a deep breath that all her wind disappears. No overt shortness of breath at other times. She is also noticing some cough and mild sore throat this afternoon but this is chronic for her even present prior to admission - Covid negative on arrival. (She has had issues with these symptoms chronically in context of recurrent ear infection.) No other focal infective symptoms. Afebrile. No CP. VSS. Telemetry shows NSR with occasional PVCs, stable from prior. HR and BP are normal. No focal neurologic symptoms. Exam is stable, RRR, lungs coarse but otherwise clear, no focal neuro signs. I discussed with Dr. Audie Box - given stable tele and normal VS, he does not feel this is specifically a cardiac issue at present time and recommends that primary team be notified - will request CBG check, 2V CXR and EKG for review. Nurse will touch base with vascular surgery team for additional recommendations.   Edited to add: EKG shows NSR 70bpm, prior inferior and anterolateral infarct, one PVC, nonspecific STTW changes similar to prior without acute change. BP earlier this afternoon 114/46, with repeat 129/62. Patient is feeling somewhat better at the moment. Therefore it is felt doubtful that symptoms are related to a cardiac issue. CXR raising question of left basilar opacities which could be developing infection. No pleural effusion. Have relayed result to APP with primary team to guide next steps.  Please call with questions. Jem Castro PA-C

## 2020-11-21 NOTE — TOC Benefit Eligibility Note (Signed)
Transition of Care [facility]) Benefit Eligibility Note    Patient Details  Name: Tammy Good MRN: [id number] Date of Birth: 08-31-1950   Medication/Dose: Arne Cleveland  5 MG BID  Covered?: Yes  Tier: 2 Drug  Prescription Coverage [facility]  , [facility]  Spoke with Person/Company/Phone Number:: ANGIE  @ [facility] [employer] # [phone] OPT- MEMBER  Co-Pay: $ 47.00  Prior Approval: No  Deductible: Met (OUT-OF-POCKET:UNMET)  Additional Notes: APIXABAN : NON-FORMULARY    Memory Argue Phone Number: 11/21/2020, 10:48 AM

## 2020-11-21 NOTE — Progress Notes (Signed)
Physical Therapy Treatment Patient Details Name: Tammy Good MRN: [id number] DOB: 06-18-1950 Today's Date: 11/21/2020    History of Present Illness Patient is a 70 y.o. female admitted 11/15/20 with a non-healing R foot would. S/p RLE abdominal aortogram and RLE angioplasty on 8/26. Pt with onset afib and NSTEMI. S/p LHC and stent on 8/29. PMH includes CAD, L toe ostemyelitis, obesity, NSTEMI.   PT Comments    Pt progressing well with mobility. Today's session focused on transfer and gait training without DME. Pt demonstrates improving stability and activity tolerance; primarily limited by back pain. Pt preparing for d/c home today; reviewed educ re: activity progression, activity recommendations, importance of mobility. Pt reports no further questions or concerns.   Resting HR 75, SpO2 95% on RA, BP 133/55 Ambulating HR 127, SpO2 96% on RA Post-mobility BP 149/81     Follow Up Recommendations  No PT follow up (cardiac rehab phase II)     Equipment Recommendations  None recommended by PT    Recommendations for Other Services       Precautions / Restrictions Precautions Precautions: Fall Restrictions Weight Bearing Restrictions: No    Mobility  Bed Mobility Overal bed mobility: Modified Independent Bed Mobility: Supine to Sit                Transfers Overall transfer level: Modified independent Equipment used: None             General transfer comment: Multiple sit<>stands from EOB and recliner without DME, mod indep for increased time  Ambulation/Gait Ambulation/Gait assistance: Supervision Gait Distance (Feet): 128 Feet Assistive device: None Gait Pattern/deviations: Step-through pattern;Decreased stride length Gait velocity: Decreased   General Gait Details: Slow, steady ambulation without DME, intermittent standing rest breaks with rail support secondary to c/o back pain; supervision for safety/lines   Stairs Stairs:  (pt declined stair  training)           Wheelchair Mobility    Modified Rankin (Stroke Patients Only)       Balance Overall balance assessment: Needs assistance Sitting-balance support: No upper extremity supported;Feet supported Sitting balance-Leahy Scale: Good     Standing balance support: No upper extremity supported;During functional activity Standing balance-Leahy Scale: Good Standing balance comment: Ambulatory without UE support                            Cognition Arousal/Alertness: Awake/alert Behavior During Therapy: WFL for tasks assessed/performed Overall Cognitive Status: Within Functional Limits for tasks assessed                                        Exercises      General Comments General comments (skin integrity, edema, etc.): Resting SpO2 95% on RA, HR 75, BP 133/55; SpO2 96% on RA with ambulation, HR up to 127; post-ambulation BP 149/81. Reviewed recommendation for cardiac rehab phase II (pt agreeable; Kristen from [facility] aware and to discuss with patient)      Pertinent Vitals/Pain Pain Assessment: Faces Faces Pain Scale: Hurts little more Pain Location: Lower back Pain Descriptors / Indicators: Discomfort;Grimacing Pain Intervention(s): Monitored during session    Home Living                      Prior Function            PT Goals (current goals  can now be found in the care plan section) Progress towards PT goals: Progressing toward goals    Frequency    Min 3X/week      PT Plan Current plan remains appropriate    Co-evaluation              AM-PAC PT "6 Clicks" Mobility   Outcome Measure  Help needed turning from your back to your side while in a flat bed without using bedrails?: None Help needed moving from lying on your back to sitting on the side of a flat bed without using bedrails?: None Help needed moving to and from a bed to a chair (including a wheelchair)?: None Help needed standing up from a  chair using your arms (e.g., wheelchair or bedside chair)?: None Help needed to walk in hospital room?: A Little Help needed climbing 3-5 steps with a railing? : A Little 6 Click Score: 22    End of Session Equipment Utilized During Treatment: Gait belt Activity Tolerance: Patient tolerated treatment well Patient left: in chair;with call bell/phone within reach Nurse Communication: Mobility status PT Visit Diagnosis: Unsteadiness on feet (R26.81);Other abnormalities of gait and mobility (R26.89);Muscle weakness (generalized) (M62.81);Pain Pain - part of body: Leg     Time: [id number] PT Time Calculation (min) (ACUTE ONLY): 24 min  Charges:  $Gait Training: 8-22 mins $Therapeutic Activity: 8-22 mins                     Mabeline Caras, PT, DPT Acute Rehabilitation Services  Pager [phone] Office [place] 11/21/2020, 9:20 AM

## 2020-11-21 NOTE — Progress Notes (Signed)
[facility]  Subjective  -no complaints   Objective (!) 150/56 70 98.2 F (36.8 C) (Oral) 18 99%  Intake/Output Summary (Last 24 hours) at 11/21/2020 0817 Last data filed at 11/21/2020 [id number] Gross per 24 hour  Intake 850 ml  Output 3000 ml  Net -2150 ml    Right DP palpable after right SFA stenting  Laboratory Lab Results: Recent Labs    11/20/20 0147 11/21/20 0145  WBC 9.2 11.1*  HGB 10.0* 11.0*  HCT 32.8* 35.2*  PLT 297 342   BMET Recent Labs    11/20/20 0147 11/21/20 0145  NA 140 138  K 3.7 3.3*  CL 108 103  CO2 25 27  GLUCOSE 140* 124*  BUN 7* 9  CREATININE 0.68 0.69  CALCIUM 8.2* 8.4*    COAG Lab Results  Component Value Date   INR 1.1 05/26/2019   INR 1.22 08/29/2013   No results found for: PTT  Assessment/Planning:  70 year old female underwent right SFA stenting last Thursday for CLI with tissue loss.  She has had a very good result and has a palpable DP pulse in the right foot.  Unfortunately she had an NSTEMI in recovery and has now undergone cardiac cath and appreciate cardiology input.  Hopefully plan for discharge today.  Will need Plavix plus Eliquis at discharge and will hold aspirin.  New onset atrial fibrillation.  Will need metoprolol 25 daily according to cardiology.  They have also added losartan 25 mg daily.  We will arrange follow-up in our office in about 4 to 6 weeks with right lower extremity arterial duplex and ABIs.  Cardiology will arrange follow-up with their practice.  Marty Heck 11/21/2020 8:17 AM --

## 2020-11-21 NOTE — Progress Notes (Addendum)
Pt just ambulated the desk with PT. Tolerated well, just fatigues easily. Discussed exercise guidelines at home, NTG, and CRPII. Will refer to [place]. Reviewed importance of Plavix and reasons for Eliquis. Pt does state now in recliner that she feels lightheaded and a "feeling" in her chest like fluttering. BP 126/70, occasional PVC. RN and PA aware. Gave her glass of water. [place] 9:37 AM 11/21/2020

## 2020-11-21 NOTE — Research (Signed)
Subject # [id number] [employer] Lp(a) [id number] Site # [phone]  SEX _0    Female                      _1    Female  Ethnicity _2   Hispanic or Latino   _3   Not Hispanic or Latino  Race _4   White                 _5   Black or African American  _6   Asian _7   American [place] or [place] Native            _8   Native Hawaiian or Other Pacific Islander                      _9   Other  Other   Age 70  Subject Group _10    Local Lab           _11   Historical Lp(a) value                                       Results:  Future research _12    Yes                    _13   No    [employer] [id number] Site # [id number] Subject ID # [id number]         ELIGIBILITY CRITERIA WORKSHEET INCLUSION CRITERIA   Subject has provided informed consent prior to the initiation of any study specific activities/procedures _14   Age 51 to 62 years _15   MI (presumed type 1) OR _16   PCI (with high-risk features) with at least 1 of the following: _17   Age >65 _18   Diabetes mellitus  HbA1c: _19   History of ischemic stroke _20   History of peripheral arterial disease _21   Residual stenosis ? 50% _22   Multivessel PCI (ie, ? 2 vessels, including branch arteries _23   EXCLUSIONS THE FOLLOWING N/A _24   Subjects known to be currently receiving investigational drug in a clinical study that is anticipated to last > 1 year _25   Known Lp(a) value <35m/dL or < 200nmol/L _26   Subject has a diagnosis of end-stage renal disease or requires dialysis. _27   Poorly controlled (glycated hemoglobin [HbA1c] > 10%) diabetes mellitus (type 1 or type 2) _28   Subject is receiving or has received lipoprotein apheresis to reduce Lp(a) within 3 months prior to enrollment. _29   Known uncontrolled or recurrent ventricular tachycardia in the past 3 months prior to enrollment. _30   Known malignancy (except non-melanoma skin cancers, cervical in situ carcinoma, breast ductal carcinoma in situ, or stage 1 prostate carcinoma) within the last 5 years prior to enrollment. _31   Known  history or evidence of clinically significant disease (eg, respiratory, gastrointestinal, or psychiatric disease) or unstable disorder or biomarker that, in the opinion of the investigator(s), would result in life expectancy < 5 years. _32   Known hemorrhagic stroke. _33    [employer] Lp(a) Informed Consent   Subject Name: RCOLENE MINES Subject met inclusion and exclusion criteria.  The informed consent form, study requirements and expectations were reviewed with the subject and questions and concerns were addressed prior to the signing of the consent form.  The subject verbalized understanding of the trial requirements.  The subject agreed to participate in the [facility] and signed the informed consent at [place] 11/21/2020.  The informed consent was obtained prior to  performance of any protocol-specific procedures for the subject.  A copy of the signed informed consent was given to the subject and a copy was placed in the subject's medical record.   Chanda Busing  [employer] Consent Version 2 Protocol Version 2

## 2020-11-21 NOTE — TOC Benefit Eligibility Note (Signed)
Patient Teacher, English as a foreign language completed.    The patient is currently admitted and upon discharge could be taking Eliquis 5 mg.  The current 30 day co-pay is, $47.00.   The patient is insured through [place] [place] Patient [facility] Specialist [place] Team Direct Number: [phone]  Fax: [phone]

## 2020-11-22 ENCOUNTER — Ambulatory Visit: Payer: BC Managed Care – PPO | Admitting: Orthopedic Surgery

## 2020-11-22 ENCOUNTER — Telehealth: Payer: Self-pay

## 2020-11-22 ENCOUNTER — Telehealth (HOSPITAL_COMMUNITY): Payer: Self-pay

## 2020-11-22 LAB — GLUCOSE, CAPILLARY
Glucose-Capillary: 163 mg/dL — ABNORMAL HIGH (ref 70–99)
Glucose-Capillary: 152 mg/dL — ABNORMAL HIGH (ref 70–99)

## 2020-11-22 LAB — CBC
HCT: 34.7 % — ABNORMAL LOW (ref 36.0–46.0)
Hemoglobin: 10.8 g/dL — ABNORMAL LOW (ref 12.0–15.0)
MCH: 28.6 pg (ref 26.0–34.0)
MCHC: 31.1 g/dL (ref 30.0–36.0)
MCV: 92 fL (ref 80.0–100.0)
Platelets: 360 10*3/uL (ref 150–400)
RBC: 3.77 MIL/uL — ABNORMAL LOW (ref 3.87–5.11)
RDW: 16 % — ABNORMAL HIGH (ref 11.5–15.5)
WBC: 11 10*3/uL — ABNORMAL HIGH (ref 4.0–10.5)
nRBC: 0 % (ref 0.0–0.2)

## 2020-11-22 LAB — BASIC METABOLIC PANEL
Anion gap: 5 (ref 5–15)
BUN: 8 mg/dL (ref 8–23)
CO2: 29 mmol/L (ref 22–32)
Calcium: 8.3 mg/dL — ABNORMAL LOW (ref 8.9–10.3)
Chloride: 105 mmol/L (ref 98–111)
Creatinine, Ser: 0.75 mg/dL (ref 0.44–1.00)
GFR, Estimated: >60 mL/min (ref >60)
Glucose, Bld: 137 mg/dL — ABNORMAL HIGH (ref 70–99)
Potassium: 3.6 mmol/L (ref 3.5–5.1)
Sodium: 139 mmol/L (ref 135–145)

## 2020-11-22 LAB — LIPOPROTEIN A (LPA): Lipoprotein (a): 103 nmol/L — ABNORMAL HIGH (ref <75.0)

## 2020-11-22 NOTE — Progress Notes (Signed)
Physical Therapy Treatment Patient Details Name: Tammy Good MRN: [id number] DOB: 06-29-1950 Today's Date: 11/22/2020    History of Present Illness Patient is a 70 y.o. female admitted 11/15/20 with a non-healing R foot would. S/p RLE abdominal aortogram and RLE angioplasty on 8/26. Pt with onset afib and NSTEMI. S/p LHC and stent on 8/29. PMH includes CAD, L toe ostemyelitis, obesity, NSTEMI.   PT Comments    Pt progressing well with mobility. Today's session focused on movement strategies with ambulation, as well as therex, to address pt's lower back pain. Recommend follow-up with outpatient PT to further address these chronic issues. Pt mobilizing independently; has met short-term goals, and pt reports no further questions or concerns. Will d/c acute PT.    Follow Up Recommendations  Outpatient PT     Equipment Recommendations  None recommended by PT    Recommendations for Other Services       Precautions / Restrictions Precautions Precautions: Fall Restrictions Weight Bearing Restrictions: No    Mobility  Bed Mobility Overal bed mobility: Modified Independent                  Transfers Overall transfer level: Modified independent Equipment used: None                Ambulation/Gait Ambulation/Gait assistance: Modified independent (Device/Increase time) Gait Distance (Feet): 100 Feet Assistive device: None Gait Pattern/deviations: Step-through pattern;Decreased stride length;Antalgic Gait velocity: Decreased   General Gait Details: Slow, steady ambulation without DME, intermittent UE support on rail/sink (pt attributes to back discomfort); verbal cues for activity pacing to prevent onset of significant back pain; noted pt walks with bilateral scapula retracted/downward rotated with hands behind back, pt reports doing this "so I could see my feet after my sx to take my toe"   Stairs             Wheelchair Mobility    Modified Rankin (Stroke  Patients Only)       Balance Overall balance assessment: Needs assistance Sitting-balance support: No upper extremity supported;Feet supported Sitting balance-Leahy Scale: Good     Standing balance support: No upper extremity supported;During functional activity Standing balance-Leahy Scale: Good                              Cognition Arousal/Alertness: Awake/alert Behavior During Therapy: WFL for tasks assessed/performed Overall Cognitive Status: Within Functional Limits for tasks assessed                                        Exercises Other Exercises Other Exercises: Reviewed various stretches and movement strategies to hopefully address lower back discomfort; also educ on chiropractor intervention versus outpatient PT    General Comments General comments (skin integrity, edema, etc.): Sitting BP 145/70, post-ambulation BP 151/66; SpO2 99% on RA      Pertinent Vitals/Pain Pain Assessment: Faces Faces Pain Scale: Hurts little more Pain Location: Lower back with prolonged ambulation Pain Descriptors / Indicators: Guarding;Sore Pain Intervention(s): Monitored during session    Home Living                      Prior Function            PT Goals (current goals can now be found in the care plan section) Progress towards PT goals: Goals met/education completed, patient  discharged from PT    Frequency    Min 3X/week      PT Plan Current plan remains appropriate    Co-evaluation              AM-PAC PT "6 Clicks" Mobility   Outcome Measure  Help needed turning from your back to your side while in a flat bed without using bedrails?: None Help needed moving from lying on your back to sitting on the side of a flat bed without using bedrails?: None Help needed moving to and from a bed to a chair (including a wheelchair)?: None Help needed standing up from a chair using your arms (e.g., wheelchair or bedside chair)?:  None Help needed to walk in hospital room?: None Help needed climbing 3-5 steps with a railing? : A Little 6 Click Score: 23    End of Session   Activity Tolerance: Patient tolerated treatment well Patient left: with call bell/phone within reach;in bed Nurse Communication: Mobility status PT Visit Diagnosis: Unsteadiness on feet (R26.81);Other abnormalities of gait and mobility (R26.89);Muscle weakness (generalized) (M62.81);Pain Pain - Right/Left: Left Pain - part of body: Leg     Time: 9233-0076 PT Time Calculation (min) (ACUTE ONLY): 24 min  Charges:  $Gait Training: 8-22 mins $Therapeutic Exercise: 8-22 mins                     Mabeline Caras, PT, DPT Acute Rehabilitation Services  Pager [phone] Office [place] 11/22/2020, 8:42 AM

## 2020-11-22 NOTE — Plan of Care (Signed)

## 2020-11-22 NOTE — Progress Notes (Signed)
Patient given discharge instructions and stated understanding. 

## 2020-11-22 NOTE — Progress Notes (Signed)
Checked with pt, she had just ambulated with PT. All questions answered. Feeling well today. [place] CES, ACSM 9:57 AM 11/22/2020

## 2020-11-22 NOTE — Progress Notes (Addendum)
[facility]  PCP: [name], MD  Primary Cardiologist: Glenetta Hew  HPI:  Tammy Good is a 70 y.o.  female  with a PMH significant for STEMI 2015, with stent to RCA, PVCS, HTN, HLD, T2DM , PAD   Inferior STEMI 2015 s/p PCI to RCA.  ECHO 2015 EF 50-55%  Admitted 11/15/20 for critical limb ischemia RLE.  Underwent Right mid to distal SFA and above-knee popliteal artery angioplasty with stent placement for non healing calf wound.    N-STEMI after right R SFA stenting due to critical limb ischemia on 11/15/2020.  S/p PCI prox LCx  Ischemic CM EF Echo 11/16/2020 EF 40 to 45%, R WMA severe hypokinesis of entire inferolateral and inferior wall.  Normal RV function, G1 DD, no LVH  Developed afib during admission converted to NSR shortly after.  Started on eliquis.  GDMT titrated discharged on Eliquis 5 twice daily, Plavix 75 daily, Farxiga 10 daily, losartan 25 mg daily, Toprol-XL 25 daily, spironolactone 25 daily.  Still some nausea after disimpaction.  Denies shortness of breath or chest pain.  She is getting around well, not having to stop and rest with daily activities.  Hasn't done much since discharge other.  BP at home has been around 818-563 systolic since discharge.    ROS: All systems negative except as listed in HPI, PMH and Problem List.  SH:  Social History   Socioeconomic History   Marital status: Widowed    Spouse name: Not on file   Number of children: Not on file   Years of education: Not on file   Highest education level: Not on file  Occupational History   Not on file  Tobacco Use   Smoking status: Former   Smokeless tobacco: Never  Substance and Sexual Activity   Alcohol use: No   Drug use: No   Sexual activity: Not Currently    Birth control/protection: Post-menopausal  Other Topics Concern   Not on file  Social History Narrative   Widow. Works at [employer].   Former smoker.   Overall not very  active.  Hoping to get into water aerobics class.   Social Determinants of Health   Financial Resource Strain: Not on file  Food Insecurity: Not on file  Transportation Needs: Not on file  Physical Activity: Not on file  Stress: Not on file  Social Connections: Not on file  Intimate Partner Violence: Not on file    FH:  Family History  Problem Relation Age of Onset   Cancer Mother 60       multiple myeloma   Heart attack Father 6   Cancer Sister    Alzheimer's disease Maternal Grandmother     Past Medical History:  Diagnosis Date   CAD S/P percutaneous coronary angioplasty 08/2013   100% mRCA - PCI Integrity Resolute DES 3.0 mm x 38 mm - 3.35 mm; PTCA of RPA V 2.0 mm x 15 mm   Cholesteatoma    right   Diabetes mellitus type 2 in obese (HCC)    On insulin and Invokana   History of osteomyelitis L 5th Toe all 05/2019   s/p Partial Ray Amputation with partial closure; 6 wks Abx & LSFA Atherectomy/DEB PTA with Stent for focal dissection.   Hyperlipidemia with target LDL less than 70    Hypothyroidism (acquired)    Mild essential hypertension    Obesity (BMI 30-39.9) 11/17/2013   PAD (peripheral artery  disease) ([place]) 05/26/2019   05/26/19: Abd AoGram- BLE runoff -> L SFA orbital atherectomy - PTA w/ DEB & Stent 6 x 40 Luttonix (for focal dissection) - patent Pop A with 3 V runoff. LEA Dopplers 01/03/2020: RABI (prev) 0.68 (0.53)/ RTBI (prev) 0.58 (0.33); LABI (prev) 0.80 (0.64), LTBI (prev) 0.64 (0.51); R mSFA ~50-74%, L mSFA 50-74%. Patent Prox SFA stent < 49% stenosis   Panhypopituitarism (HCC)    ST elevation myocardial infarction (STEMI) of inferior wall, subsequent episode of care ([place]) 08/2013   80% branch of D1, 40% mid AV groove circumflex, 100% RCA with subacute thrombus -- thrombus extending into RPA V with 100% occlusion after initial angioplasty of mid RCA ;; Post MI ECHO 6/9/'15: EF 50-55%, mild LVH with moderate HK of inferior wall, Gr1 DD, mild LA dilation; mildly  reduced RV function    Current Outpatient Medications  Medication Sig Dispense Refill   apixaban (ELIQUIS) 5 MG TABS tablet Take 1 tablet (5 mg total) by mouth 2 (two) times daily. 60 tablet 6   atorvastatin (LIPITOR) 40 MG tablet Take 1 tablet (40 mg total) by mouth daily at 6 PM. 30 tablet 0   B-D UF III MINI PEN NEEDLES 31G X 5 MM MISC Inject into the skin.     clopidogrel (PLAVIX) 75 MG tablet Take 1 tablet by mouth once daily with breakfast 90 tablet 0   Continuous Blood Gluc Sensor (FREESTYLE LIBRE 2 SENSOR) MISC Apply topically every 14 (fourteen) days.     dexamethasone (DECADRON) 0.5 MG tablet Take 0.5 mg by mouth daily.     FARXIGA 10 MG TABS tablet Take 10 mg by mouth daily.      ferrous sulfate 325 (65 FE) MG tablet Take 325 mg by mouth daily with breakfast.     Insulin Glargine (BASAGLAR KWIKPEN) 100 UNIT/ML SOPN Inject 48 Units into the skin at bedtime.     levothyroxine (SYNTHROID) 125 MCG tablet Take 1 tablet (125 mcg total) by mouth daily before breakfast. 30 tablet 2   losartan (COZAAR) 25 MG tablet Take 1 tablet (25 mg total) by mouth daily. 30 tablet 11   metoprolol succinate (TOPROL-XL) 25 MG 24 hr tablet Take 1 tablet (25 mg total) by mouth daily. 30 tablet 11   nitroGLYCERIN (NITROSTAT) 0.4 MG SL tablet Place 1 tablet (0.4 mg total) under the tongue every 5 (five) minutes as needed for chest pain (up to 3 doses. If taking 3rd dose call 911). 25 tablet 3   NOVOLOG FLEXPEN 100 UNIT/ML FlexPen Inject 5-15 Units into the skin 3 (three) times daily with meals. Per sliding scale     nystatin cream (MYCOSTATIN) Apply 1 application topically 2 (two) times daily. (Patient taking differently: Apply 1 application topically 2 (two) times daily as needed (rash).) 30 g 1   Omega-3 Fatty Acids (FISH OIL) 1000 MG CAPS Take 1,000 mg by mouth daily.     ONETOUCH VERIO test strip   1   pantoprazole (PROTONIX) 40 MG tablet Take 1 tablet (40 mg total) by mouth 2 (two) times daily before a  meal. 60 tablet 0   Propylene Glycol (SYSTANE COMPLETE) 0.6 % SOLN Place 1 drop into both eyes daily as needed (dry eyes).     silver sulfADIAZINE (SILVADENE) 1 % cream Apply 1 application topically daily. Apply thin layer to leg wound daily 50 g 0   spironolactone (ALDACTONE) 25 MG tablet Take 1 tablet (25 mg total) by mouth daily. 30 tablet 0  No current facility-administered medications for this encounter.    Vitals:   11/27/20 1112  BP: 118/60  Pulse: 86  SpO2: 97%  Weight: 103.2 kg (227 lb 8 oz)    PHYSICAL EXAM Cardiac: JVD flat, normal rate and rhythm, clear s1 and s2, no murmurs, rubs or gallops Pulmonary: CTAB, not in distress Abdominal: non distended abdomen, soft and nontender Psych: Alert, conversant, in good spirits  ECG  NSR rate 77, old inferior/anterolateral infarcts  ASSESSMENT & PLAN: Ischemic CM CAD Chronic Systolic CHF -ECHO 3235 EF 50-55% -STEMI 2015 with stent to RCA  -LHC 11/16/20 Severe two-vessel coronary artery disease including 95% proximal LCx stenosis and multifocal D1 disease of up to 90%.Mild to moderate LAD and RCA disease. Patent RCA stent. Successful PCI of prox LCx lesion.   -Echo 11/16/2020 EF 40 to 45%, R WMA severe hypokinesis of entire inferolateral and inferior wall.  Normal RV function, G1 DD, no LVH. -NYHA Class II, euvolemic on exam -Continue Eliquis 5 twice daily, Plavix 75 daily, Farxiga 10 daily, losartan 25 mg daily, Spironolactone 25 daily -Increase Toprol XL to 25 BID  PAF -newly diagnosed during most recent admission in the setting of NSTEMI, iatrogenic hyperthyroid state and HTN -spontaneously converted to NSR  -started on eliquis, continued on bb -remains in NSR, increase Toprol-XL as above  Hx Hypothyroidism, now with medication induced hyperthyroidism -synthroid dose decreased during recent admission, will need repeat thyroid labs around 5 weeks  T2DM -continue farxiga   Follow up with [facility] cardiology

## 2020-11-22 NOTE — Progress Notes (Addendum)
[facility] much better today.   Objective (!) 156/49 70 98.3 F (36.8 C) (Oral) 18 97%  Intake/Output Summary (Last 24 hours) at 11/22/2020 0729 Last data filed at 11/22/2020 0451 Gross per 24 hour  Intake --  Output 1800 ml  Net -1800 ml   Palpable right DP Right lower leg dressing changed this am per RN, C/D/I Heart RRR Lungs non labored breathing   Assessment/Planning: 70 y.o. female is s/p:  left transfemoral access with right SFA stenting Heart Cath Non-STEMI -Had non-STEMI after right R SFA stenting due to critical limb ischemia on 11/15/2020.  Complaints of dizziness-loopy feeling has resolved and did not appear to be cardiac related. Plan discharge home today.  Roxy Horseman 11/22/2020 7:29 AM --  Laboratory Lab Results: Recent Labs    11/21/20 0145 11/22/20 0318  WBC 11.1* 11.0*  HGB 11.0* 10.8*  HCT 35.2* 34.7*  PLT 342 360   BMET Recent Labs    11/21/20 0145 11/22/20 0318  NA 138 139  K 3.3* 3.6  CL 103 105  CO2 27 29  GLUCOSE 124* 137*  BUN 9 8  CREATININE 0.69 0.75  CALCIUM 8.4* 8.3*    COAG Lab Results  Component Value Date   INR 1.1 05/26/2019   INR 1.22 08/29/2013   No results found for: PTT   I have seen and evaluated the patient. I agree with the PA note as documented above.  Status post revascularization of right leg with multiple SFA stents for CLI with tissue loss.  She had an NSTEMI post procedure in recovery.  Now has undergone cath with coronary intervention.  Plan discharge today on Plavix plus DOAC.  She had some lightheadedness yesterday so we kept an extra day.  Looks better today right DP palpable.  We will arrange follow-up with me in about a month with noninvasive imaging of the right leg and has follow-up with cardiology.  Marty Heck, MD [facility] Office: [phone]

## 2020-11-22 NOTE — Care Management (Signed)
1108 11-22-20 Case Manager spoke with patient regarding physical therapy (PT) recommendations for outpatient PT. Patient is agreeable to outpatient PT at the [address] location. Case Manager spoke with PA Theda Sers and both in agreement for Case Manager to place ambulatory orders. The [address] location will contact the patient within a week and if not, the patient is aware to call. No further needs from Case Manager at this time.

## 2020-11-22 NOTE — Telephone Encounter (Signed)
Pt insurance is active and benefits verified through [employer] Co-pay 0, DED $1,500/$1,500 met, out of pocket $5,900/$3,642.54 met, co-insurance 30%. no pre-authorization required. Passport, 11/22/2020@, REF# [id number]  2ndary insurance is active and benefits verified through Medicare a/b. Co-pay 0, DED $233/$233 met, out of pocket 0/0 met, co-insurance 20%. No pre-authorization required. Passport, 11/22/2020'@11' :17am, REF# [id number]   Will contact patient to see if she is interested in the Cardiac Rehab Program. If interested, patient will need to complete follow up appt. Once completed, patient will be contacted for scheduling upon review by the RN Navigator.

## 2020-11-23 ENCOUNTER — Encounter (HOSPITAL_COMMUNITY): Payer: Self-pay | Admitting: *Deleted

## 2020-11-23 ENCOUNTER — Telehealth: Payer: Self-pay | Admitting: Endocrinology

## 2020-11-23 ENCOUNTER — Other Ambulatory Visit: Payer: Self-pay

## 2020-11-23 ENCOUNTER — Telehealth (HOSPITAL_COMMUNITY): Payer: Self-pay

## 2020-11-23 ENCOUNTER — Emergency Department (HOSPITAL_COMMUNITY)
Admission: EM | Admit: 2020-11-23 | Discharge: 2020-11-24 | Disposition: A | Discharge status: Home / Self Care | Payer: Medicare Other | Attending: Emergency Medicine | Admitting: Emergency Medicine

## 2020-11-23 DIAGNOSIS — Z79899 Other long term (current) drug therapy: Secondary | ICD-10-CM | POA: Insufficient documentation

## 2020-11-23 DIAGNOSIS — Z7902 Long term (current) use of antithrombotics/antiplatelets: Secondary | ICD-10-CM | POA: Insufficient documentation

## 2020-11-23 DIAGNOSIS — Z794 Long term (current) use of insulin: Secondary | ICD-10-CM | POA: Diagnosis not present

## 2020-11-23 DIAGNOSIS — Z7901 Long term (current) use of anticoagulants: Secondary | ICD-10-CM | POA: Insufficient documentation

## 2020-11-23 DIAGNOSIS — K5900 Constipation, unspecified: Secondary | ICD-10-CM | POA: Diagnosis present

## 2020-11-23 DIAGNOSIS — E039 Hypothyroidism, unspecified: Secondary | ICD-10-CM | POA: Diagnosis not present

## 2020-11-23 DIAGNOSIS — K5901 Slow transit constipation: Secondary | ICD-10-CM | POA: Diagnosis not present

## 2020-11-23 DIAGNOSIS — I10 Essential (primary) hypertension: Secondary | ICD-10-CM | POA: Diagnosis not present

## 2020-11-23 DIAGNOSIS — E785 Hyperlipidemia, unspecified: Secondary | ICD-10-CM | POA: Insufficient documentation

## 2020-11-23 DIAGNOSIS — Z87891 Personal history of nicotine dependence: Secondary | ICD-10-CM | POA: Insufficient documentation

## 2020-11-23 DIAGNOSIS — E1169 Type 2 diabetes mellitus with other specified complication: Secondary | ICD-10-CM | POA: Insufficient documentation

## 2020-11-23 DIAGNOSIS — I2510 Atherosclerotic heart disease of native coronary artery without angina pectoris: Secondary | ICD-10-CM | POA: Diagnosis not present

## 2020-11-23 LAB — COMPREHENSIVE METABOLIC PANEL
ALT: 16 U/L (ref 0–44)
AST: 23 U/L (ref 15–41)
Albumin: 3 g/dL — ABNORMAL LOW (ref 3.5–5.0)
Alkaline Phosphatase: 73 U/L (ref 38–126)
Anion gap: 11 (ref 5–15)
BUN: 11 mg/dL (ref 8–23)
CO2: 25 mmol/L (ref 22–32)
Calcium: 8.7 mg/dL — ABNORMAL LOW (ref 8.9–10.3)
Chloride: 102 mmol/L (ref 98–111)
Creatinine, Ser: 1.1 mg/dL — ABNORMAL HIGH (ref 0.44–1.00)
GFR, Estimated: 55 mL/min — ABNORMAL LOW (ref >60)
Glucose, Bld: 256 mg/dL — ABNORMAL HIGH (ref 70–99)
Potassium: 3.7 mmol/L (ref 3.5–5.1)
Sodium: 138 mmol/L (ref 135–145)
Total Bilirubin: 2 mg/dL — ABNORMAL HIGH (ref 0.3–1.2)
Total Protein: 6.9 g/dL (ref 6.5–8.1)

## 2020-11-23 LAB — CBC WITH DIFFERENTIAL/PLATELET
Abs Immature Granulocytes: 0.09 10*3/uL — ABNORMAL HIGH (ref 0.00–0.07)
Basophils Absolute: 0.1 10*3/uL (ref 0.0–0.1)
Basophils Relative: 1 %
Eosinophils Absolute: 0.2 10*3/uL (ref 0.0–0.5)
Eosinophils Relative: 2 %
HCT: 40.2 % (ref 36.0–46.0)
Hemoglobin: 12.1 g/dL (ref 12.0–15.0)
Immature Granulocytes: 1 %
Lymphocytes Relative: 9 %
Lymphs Abs: 1.1 10*3/uL (ref 0.7–4.0)
MCH: 28.3 pg (ref 26.0–34.0)
MCHC: 30.1 g/dL (ref 30.0–36.0)
MCV: 93.9 fL (ref 80.0–100.0)
Monocytes Absolute: 0.6 10*3/uL (ref 0.1–1.0)
Monocytes Relative: 5 %
Neutro Abs: 10.5 10*3/uL — ABNORMAL HIGH (ref 1.7–7.7)
Neutrophils Relative %: 82 %
Platelets: 511 10*3/uL — ABNORMAL HIGH (ref 150–400)
RBC: 4.28 MIL/uL (ref 3.87–5.11)
RDW: 17 % — ABNORMAL HIGH (ref 11.5–15.5)
WBC: 12.5 10*3/uL — ABNORMAL HIGH (ref 4.0–10.5)
nRBC: 0 % (ref 0.0–0.2)

## 2020-11-23 LAB — LIPASE, BLOOD: Lipase: 27 U/L (ref 11–51)

## 2020-11-23 NOTE — Telephone Encounter (Signed)
Called to confirm Heart & Vascular Transitions of Care appointment at [place] on Tuesday 9/6. Patient reminded to bring all medications and pill box organizer with them. Navigator enrolled in [facility] transportation service and set up first ride to HV TOC appt.   Confirmed appointment prior to ending call.   Pricilla Holm, MSN, RN Heart Failure Nurse Navigator [phone]

## 2020-11-23 NOTE — ED Provider Notes (Signed)
Emergency Medicine Provider Triage Evaluation Note  Tammy Good , a 70 y.o. female  was evaluated in triage.  Pt complains of constipation. Patient was discharged from hospital yesterday, had small BM yesterday that was soft but is complaining of worsening rectal pain and feeling like she needs to have BM.  Tried laxative and enema with no relief.   Review of Systems  Positive: Chills, abdominal pain, rectal pain, constipation  Negative: CP, SOB, blood in stool, dysuria, hematuria  Physical Exam  BP 92/62 (BP Location: Right Arm)   Pulse 94   Temp 98.7 F (37.1 C)   Resp 20   Ht '5\' 3"'$  (1.6 m)   Wt 104.3 kg   SpO2 100%   BMI 40.74 kg/m  Gen:   Awake, no distress   Resp:  Normal effort  MSK:   Moves extremities without difficulty  Other:  No TTP of abdomen, abdomen soft non-distended  Medical Decision Making  Medically screening exam initiated at 7:03 PM.  Appropriate orders placed.  Tammy Good was informed that the remainder of the evaluation will be completed by another provider, this initial triage assessment does not replace that evaluation, and the importance of remaining in the ED until their evaluation is complete.     Estill Cotta 11/23/20 Glenetta Hew, MD 11/25/20 [phone]

## 2020-11-23 NOTE — ED Triage Notes (Signed)
The pt reports that she is constipated she just left the hospital a day ago  she had a small bm then she has had a laxative and an enema with little or no results

## 2020-11-23 NOTE — Telephone Encounter (Signed)
   MEESHA BROMAN DOB: 1951/01/28 MRN: [id number]   RIDER WAIVER AND RELEASE OF LIABILITY  For purposes of improving physical access to our facilities, [facility] is pleased to partner with third parties to provide [facility] patients or other authorized individuals the option of convenient, on-demand ground transportation services (the [place]") through use of the technology service that enables users to request on-demand ground transportation from independent third-party providers.  By opting to use and accept these [employer], I, the undersigned, hereby agree on behalf of myself, and on behalf of any minor child using the Government social research officer for whom I am the parent or legal guardian, as follows:  Government social research officer provided to me are provided by independent third-party transportation providers who are not [employer] or employees and who are unaffiliated with [employer]. [facility] is neither a transportation carrier nor a common or public carrier. [facility] has no control over the quality or safety of the transportation that occurs as a result of the [employer]. [facility] cannot guarantee that any third-party transportation provider will complete any arranged transportation service. [facility] makes no representation, warranty, or guarantee regarding the reliability, timeliness, quality, safety, suitability, or availability of any of the Transport Services or that they will be error free. I fully understand that traveling by vehicle involves risks and dangers of serious bodily injury, including permanent disability, paralysis, and death. I agree, on behalf of myself and on behalf of any minor child using the Transport Services for whom I am the parent or legal guardian, that the entire risk arising out of my use of the [employer] remains solely with me, to the maximum extent permitted under applicable law. The [employer] are provided "as  is" and "as available." [facility] disclaims all representations and warranties, express, implied or statutory, not expressly set out in these terms, including the implied warranties of merchantability and fitness for a particular purpose. I hereby waive and release [facility], its agents, employees, officers, directors, representatives, insurers, attorneys, assigns, successors, subsidiaries, and affiliates from any and all past, present, or future claims, demands, liabilities, actions, causes of action, or suits of any kind directly or indirectly arising from acceptance and use of the [employer]. I further waive and release [facility] and its affiliates from all present and future liability and responsibility for any injury or death to persons or damages to property caused by or related to the use of the [employer]. I have read this Waiver and Release of Liability, and I understand the terms used in it and their legal significance. This Waiver is freely and voluntarily given with the understanding that my right (as well as the right of any minor child for whom I am the parent or legal guardian using the [employer]) to legal recourse against  in connection with the [employer] is knowingly surrendered in return for use of these services.   I attest that I read the consent document to [facility] [place], gave Ms. Mceachin the opportunity to ask questions and answered the questions asked (if any). I affirm that [facility] then provided consent for she's participation in this program.     Darrick Meigs [place]

## 2020-11-24 NOTE — ED Provider Notes (Signed)
[facility] EMERGENCY DEPARTMENT Provider Note   CSN: [id number] Arrival date & time: 11/23/20  1802     History Chief Complaint  Patient presents with   Constipation    Tammy Good is a 70 y.o. female.  The history is provided by the patient and a relative.  Constipation Severity:  Moderate Timing:  Constant Progression:  Worsening Chronicity:  New Stool description:  Hard and small Relieved by:  Nothing Worsened by:  Nothing Associated symptoms: nausea   Associated symptoms: no abdominal pain, no fever and no vomiting   Patient with extensive medical history including CAD, diabetes presents with constipation.  Patient was just in the hospital for vascular procedures.  After discharge she reports that she has been having constipation and only producing small amount of stool.  She reports that she feels impacted.  She has tried laxatives, enemas and she personally tried to disimpact herself without improvement    Past Medical History:  Diagnosis Date   CAD S/P percutaneous coronary angioplasty 08/2013   100% mRCA - PCI Integrity Resolute DES 3.0 mm x 38 mm - 3.35 mm; PTCA of RPA V 2.0 mm x 15 mm   Cholesteatoma    right   Diabetes mellitus type 2 in obese (HCC)    On insulin and Invokana   History of osteomyelitis L 5th Toe all 05/2019   s/p Partial Ray Amputation with partial closure; 6 wks Abx & LSFA Atherectomy/DEB PTA with Stent for focal dissection.   Hyperlipidemia with target LDL less than 70    Hypothyroidism (acquired)    Mild essential hypertension    Obesity (BMI 30-39.9) 11/17/2013   PAD (peripheral artery disease) ([place]) 05/26/2019   05/26/19: Abd AoGram- BLE runoff -> L SFA orbital atherectomy - PTA w/ DEB & Stent 6 x 40 Luttonix (for focal dissection) - patent Pop A with 3 V runoff. LEA Dopplers 01/03/2020: RABI (prev) 0.68 (0.53)/ RTBI (prev) 0.58 (0.33); LABI (prev) 0.80 (0.64), LTBI (prev) 0.64 (0.51); R mSFA ~50-74%, L mSFA 50-74%.  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 ([place]) 06/12/2246   Atherosclerotic heart disease of native coronary artery without angina pectoris 03/20/2020   Epigastric pain 03/20/2020   Nausea and vomiting 03/20/2020   Absence of toe ([place]) 06/28/2019   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   PAD (peripheral artery disease) ([place]) 05/26/2019   Osteomyelitis of left foot ([place]) 05/24/2019   Osteomyelitis ([place]) 05/23/2019   Cellulitis and abscess of foot, except toes 05/18/2019   Chronic osteomyelitis of ankle and foot ([place]) 05/18/2019   Cholesteatoma 02/24/2019   Cellulitis and abscess of toe 11/09/2018   Colon cancer screening 11/30/2017   Long term (current) use of insulin ([place]) 03/05/2017   Iron deficiency anemia 12/10/2014   Obesity (BMI 30-39.9) 11/17/2013   Diabetes mellitus type 2 in obese [facility])    Essential hypertension  Right thigh pain 11/01/2013   PVC's (premature ventricular contractions) 11/01/2013   Status post insertion of drug eluting coronary artery stent to [facility] emergently and PTCA to prox. PLA 09/16/2013   Hyperlipidemia associated with type 2 diabetes mellitus ([place]) 09/16/2013   Presence of coronary angioplasty implant and graft 09/08/2013   Panhypopituitarism ([place]) 08/30/2013   Non-ST elevation (NSTEMI) myocardial infarction [facility]) 08/22/2013   CAD S/P percutaneous coronary angioplasty  08/22/2013    Past Surgical History:  Procedure Laterality Date   ABDOMINAL AORTOGRAM W/LOWER EXTREMITY N/A 05/26/2019   Procedure: ABDOMINAL AORTOGRAM W/LOWER EXTREMITY;  Surgeon: Marty Heck, MD;  Location: [place] CV LAB;  Service: Cardiovascular;  Laterality: N/A;   ABDOMINAL AORTOGRAM W/LOWER EXTREMITY N/A 11/15/2020   Procedure: ABDOMINAL AORTOGRAM W/LOWER EXTREMITY;  Surgeon: Marty Heck, MD;  Location: [place] CV LAB;  Service: Cardiovascular;  Laterality: N/A;   AMPUTATION Left 05/25/2019   Procedure: AMPUTATION RAY 5th;  Surgeon: Trula Slade, DPM;  Location: [place];  Service: Podiatry;  Laterality: Left;   Cardiac Event Monitor  July-August 2015   Sinus rhythm with PVCs   COLONOSCOPY N/A 08/31/2013   Procedure: COLONOSCOPY;  Surgeon: Juanita Craver, MD;  Location: [facility] ENDOSCOPY;  Service: Endoscopy;  Laterality: N/A;   CORONARY STENT INTERVENTION N/A 11/19/2020   Procedure: CORONARY STENT INTERVENTION;  Surgeon: Nelva Bush, MD;  Location: [place] CV LAB;  Service: Cardiovascular;  Laterality: N/A;   ESOPHAGOGASTRODUODENOSCOPY N/A 09/01/2013   Procedure: ESOPHAGOGASTRODUODENOSCOPY (EGD);  Surgeon: Beryle Beams, MD;  Location: [facility] ENDOSCOPY;  Service: Endoscopy;  Laterality: N/A;  bedside   LEFT HEART CATH AND CORONARY ANGIOGRAPHY N/A 11/19/2020   Procedure: LEFT HEART CATH AND CORONARY ANGIOGRAPHY;  Surgeon: Nelva Bush, MD;  Location: [place] CV LAB;  Service: Cardiovascular;  Laterality: N/A;   LEFT HEART CATHETERIZATION WITH CORONARY ANGIOGRAM N/A 08/30/2013   Procedure: LEFT HEART CATHETERIZATION WITH CORONARY ANGIOGRAM;  Surgeon: Leonie Man, MD;  Location: [facility] CATH LAB: 100% mRCA (thrombus - extends to RPAV), 80% D1, 40% AVG Cx.   PERCUTANEOUS CORONARY STENT INTERVENTION (PCI-S)  08/30/2013   Procedure: PERCUTANEOUS CORONARY STENT INTERVENTION (PCI-S);  Surgeon: Leonie Man, MD;  Location: [facility] CATH LAB;  Integrity Resolute DES  2.0 mm x 38 mm -- 3.35 mm.; PTCA of proximal RPA V. - 3.0 mm x 15 mm balloon   PERIPHERAL VASCULAR ATHERECTOMY  05/26/2019   Procedure: PERIPHERAL VASCULAR ATHERECTOMY;  Surgeon: Marty Heck, MD;  Location: [place] CV LAB;  Service: Cardiovascular;;  Left SFA   PERIPHERAL VASCULAR INTERVENTION  05/26/2019   Procedure: PERIPHERAL VASCULAR INTERVENTION;  Surgeon: Marty Heck, MD;  Location: [place] CV LAB;  Service: Cardiovascular;;  Left SFA   PERIPHERAL VASCULAR INTERVENTION Right 11/15/2020   Procedure: PERIPHERAL VASCULAR INTERVENTION;  Surgeon: Marty Heck, MD;  Location: [place] CV LAB;  Service: Cardiovascular;  Laterality: Right;  Superficial Femoral Artery   PITUITARY SURGERY     TRANSTHORACIC ECHOCARDIOGRAM  08/30/2013   mild LVH. EF 50-55%. Moderate HK of the entire inferior myocardium. GR 1 DD. Mild LA dilation. Mildly reduced RV function   TYMPANOMASTOIDECTOMY Right 12/28/2017   Procedure: RIGHT TYMPANOMASTOIDECTOMY;  Surgeon: Leta Baptist, MD;  Location: [place];  Service: ENT;  Laterality: Right;     OB History   No obstetric history on file.     Family History  Problem Relation Age of Onset   Cancer Mother 32  multiple myeloma   Heart attack Father 76   Cancer Sister    Alzheimer's disease Maternal Grandmother     Social History   Tobacco Use   Smoking status: Former   Smokeless tobacco: Never  Substance Use Topics   Alcohol use: No   Drug use: No    Home Medications Prior to Admission medications   Medication Sig Start Date End Date Taking? Authorizing Provider  apixaban (ELIQUIS) 5 MG TABS tablet Take 1 tablet (5 mg total) by mouth 2 (two) times daily. 11/21/20   Baglia, Corrina, PA-C  atorvastatin (LIPITOR) 40 MG tablet Take 1 tablet (40 mg total) by mouth daily at 6 PM. 09/03/13   Thereasa Solo, Kimberlee Nearing, MD  B-D UF III MINI PEN NEEDLES 31G X 5 MM MISC Inject into the skin. 05/28/20   [provider]   clopidogrel (PLAVIX) 75 MG tablet Take 1 tablet by mouth once daily with breakfast 10/25/20   Marty Heck, MD  Continuous Blood Gluc Sensor (FREESTYLE LIBRE 2 SENSOR) MISC Apply topically every 14 (fourteen) days. 06/12/20   [provider]  dexamethasone (DECADRON) 0.5 MG tablet Take 0.5 mg by mouth daily. 09/03/13   Cherene Altes, MD  FARXIGA 10 MG TABS tablet Take 10 mg by mouth daily.  07/25/15   [provider]  ferrous sulfate 325 (65 FE) MG tablet Take 325 mg by mouth daily with breakfast.    [provider]  Insulin Glargine (BASAGLAR KWIKPEN) 100 UNIT/ML SOPN Inject 48 Units into the skin at bedtime. 07/11/15   [provider]  levothyroxine (SYNTHROID) 125 MCG tablet Take 1 tablet (125 mcg total) by mouth daily before breakfast. 11/21/20   Baglia, Corrina, PA-C  losartan (COZAAR) 25 MG tablet Take 1 tablet (25 mg total) by mouth daily. 11/21/20   Baglia, Corrina, PA-C  metoprolol succinate (TOPROL-XL) 25 MG 24 hr tablet Take 1 tablet (25 mg total) by mouth daily. 11/21/20   Baglia, Corrina, PA-C  nitroGLYCERIN (NITROSTAT) 0.4 MG SL tablet Place 1 tablet (0.4 mg total) under the tongue every 5 (five) minutes as needed for chest pain (up to 3 doses. If taking 3rd dose call 911). 11/21/20   Dunn, Dayna N, PA-C  NOVOLOG FLEXPEN 100 UNIT/ML FlexPen Inject 5-15 Units into the skin 3 (three) times daily with meals. Per sliding scale 01/03/19   [provider]  nystatin cream (MYCOSTATIN) Apply 1 application topically 2 (two) times daily. Patient taking differently: Apply 1 application topically 2 (two) times daily as needed (rash). 08/29/19   Carlyle Basques, MD  Omega-3 Fatty Acids (FISH OIL) 1000 MG CAPS Take 1,000 mg by mouth daily.    [provider]  [facility] VERIO test strip  12/24/17   [provider]  pantoprazole (PROTONIX) 40 MG tablet Take 1 tablet (40 mg total) by mouth 2 (two) times daily before a meal. 09/03/13   Cherene Altes, MD  Propylene Glycol (SYSTANE COMPLETE) 0.6 % SOLN Place 1 drop into both eyes daily as needed (dry eyes).    [provider]  silver sulfADIAZINE (SILVADENE) 1 % cream Apply 1 application topically daily. Apply thin layer to leg wound daily 11/08/20   Persons, Bevely Palmer, [place]  spironolactone (ALDACTONE) 25 MG tablet Take 1 tablet (25 mg total) by mouth daily. 11/21/20   Baglia, Corrina, PA-C    Allergies    Strawberry extract  Review of Systems   Review of Systems  Constitutional:  Negative for fever.  Cardiovascular:  Negative for chest pain.  Gastrointestinal:  Positive for constipation, nausea and rectal pain. Negative for abdominal pain and vomiting.  All other systems reviewed and are negative.  Physical Exam Updated Vital Signs BP (!) 128/50   Pulse 81   Temp 98.7 F (37.1 C)   Resp 19   Ht 1.6 m ('5\' 3"' )   Wt 104.3 kg   SpO2 96%   BMI 40.74 kg/m   Physical Exam CONSTITUTIONAL: Elderly, no acute distress HEAD: Normocephalic/atraumatic EYES: EOMI NECK: supple no meningeal signs SPINE/BACK:entire spine nontender CV: S1/S2 noted, no murmurs/rubs/gallops noted LUNGS: Lungs are clear to auscultation bilaterally, no apparent distress ABDOMEN: soft, nontender, no rebound or guarding, bowel sounds noted throughout abdomen Rectal-stool impaction noted, no blood or melena.  No rectal mass or abscess.  Chaperone present for exam GU:no cva tenderness NEURO: Pt is awake/alert/appropriate, moves all extremitiesx4.  No facial droop.   EXTREMITIES: full ROM, bruising to left thigh from recent procedure Chronic wound noted to right lower extremity SKIN: warm, color normal PSYCH: no abnormalities of mood noted, alert and oriented to situation  ED Results / Procedures / Treatments   Labs (all labs ordered are listed, but only abnormal results are displayed) Labs Reviewed  CBC WITH DIFFERENTIAL/PLATELET - Abnormal; Notable for the following components:      Result  Value   WBC 12.5 (*)    RDW 17.0 (*)    Platelets 511 (*)    Neutro Abs 10.5 (*)    Abs Immature Granulocytes 0.09 (*)    All other components within normal limits  COMPREHENSIVE METABOLIC PANEL - Abnormal; Notable for the following components:   Glucose, Bld 256 (*)    Creatinine, Ser 1.10 (*)    Calcium 8.7 (*)    Albumin 3.0 (*)    Total Bilirubin 2.0 (*)    GFR, Estimated 55 (*)    All other components within normal limits  LIPASE, BLOOD    EKG None  Radiology No results found.  Procedures Fecal disimpaction  Date/Time: 11/24/2020 1:30 AM Performed by: Ripley Fraise, MD Authorized by: Ripley Fraise, MD  Local anesthesia used: no  Anesthesia: Local anesthesia used: no Patient tolerance: patient tolerated the procedure well with no immediate complications Comments: With chaperone present, manual disimpaction of stool impaction performed.  Patient tolerated well.     Medications Ordered in ED Medications - No data to display  ED Course  I have reviewed the triage vital signs and the nursing notes.  Pertinent labs results that were available during my care of the patient were reviewed by me and considered in my medical decision making (see chart for details).    MDM Rules/Calculators/A&P                           Patient presents with constipation after recent hospital stay.  She denies any chronic opiate use.  Patient tolerated fecal disimpaction very well.  She reports feeling improved.  No abdominal pain.  No vomiting.  We discussed appropriate use of medications as well as increase fiber to help with constipation Final Clinical Impression(s) / ED Diagnoses Final diagnoses:  Slow transit constipation    Rx / DC Orders ED Discharge Orders     None        Ripley Fraise, MD 11/24/20 [phone]

## 2020-11-27 ENCOUNTER — Other Ambulatory Visit: Payer: Self-pay

## 2020-11-27 ENCOUNTER — Other Ambulatory Visit (HOSPITAL_COMMUNITY): Payer: Self-pay

## 2020-11-27 ENCOUNTER — Encounter (HOSPITAL_COMMUNITY): Payer: Self-pay

## 2020-11-27 ENCOUNTER — Ambulatory Visit (HOSPITAL_COMMUNITY)
Admit: 2020-11-27 | Discharge: 2020-11-27 | Disposition: A | Discharge status: Home / Self Care | Payer: Medicare Other | Source: Ambulatory Visit | Attending: Internal Medicine | Admitting: Internal Medicine

## 2020-11-27 VITALS — BP 118/60 | HR 86 | Wt 227.5 lb

## 2020-11-27 DIAGNOSIS — I110 Hypertensive heart disease with heart failure: Secondary | ICD-10-CM | POA: Diagnosis present

## 2020-11-27 DIAGNOSIS — Z7902 Long term (current) use of antithrombotics/antiplatelets: Secondary | ICD-10-CM | POA: Diagnosis not present

## 2020-11-27 DIAGNOSIS — E1151 Type 2 diabetes mellitus with diabetic peripheral angiopathy without gangrene: Secondary | ICD-10-CM | POA: Diagnosis not present

## 2020-11-27 DIAGNOSIS — E0590 Thyrotoxicosis, unspecified without thyrotoxic crisis or storm: Secondary | ICD-10-CM | POA: Diagnosis not present

## 2020-11-27 DIAGNOSIS — I2510 Atherosclerotic heart disease of native coronary artery without angina pectoris: Secondary | ICD-10-CM | POA: Diagnosis not present

## 2020-11-27 DIAGNOSIS — E039 Hypothyroidism, unspecified: Secondary | ICD-10-CM | POA: Insufficient documentation

## 2020-11-27 DIAGNOSIS — Z7901 Long term (current) use of anticoagulants: Secondary | ICD-10-CM | POA: Insufficient documentation

## 2020-11-27 DIAGNOSIS — Z955 Presence of coronary angioplasty implant and graft: Secondary | ICD-10-CM | POA: Insufficient documentation

## 2020-11-27 DIAGNOSIS — Z794 Long term (current) use of insulin: Secondary | ICD-10-CM | POA: Insufficient documentation

## 2020-11-27 DIAGNOSIS — Z79899 Other long term (current) drug therapy: Secondary | ICD-10-CM | POA: Insufficient documentation

## 2020-11-27 DIAGNOSIS — I480 Paroxysmal atrial fibrillation: Secondary | ICD-10-CM | POA: Insufficient documentation

## 2020-11-27 DIAGNOSIS — Z9861 Coronary angioplasty status: Secondary | ICD-10-CM

## 2020-11-27 DIAGNOSIS — Z87891 Personal history of nicotine dependence: Secondary | ICD-10-CM | POA: Insufficient documentation

## 2020-11-27 DIAGNOSIS — Z7952 Long term (current) use of systemic steroids: Secondary | ICD-10-CM | POA: Diagnosis not present

## 2020-11-27 DIAGNOSIS — I5022 Chronic systolic (congestive) heart failure: Secondary | ICD-10-CM | POA: Insufficient documentation

## 2020-11-27 DIAGNOSIS — E669 Obesity, unspecified: Secondary | ICD-10-CM

## 2020-11-27 DIAGNOSIS — Z8249 Family history of ischemic heart disease and other diseases of the circulatory system: Secondary | ICD-10-CM | POA: Diagnosis not present

## 2020-11-27 DIAGNOSIS — I255 Ischemic cardiomyopathy: Secondary | ICD-10-CM | POA: Insufficient documentation

## 2020-11-27 DIAGNOSIS — E1169 Type 2 diabetes mellitus with other specified complication: Secondary | ICD-10-CM | POA: Diagnosis not present

## 2020-11-27 LAB — BASIC METABOLIC PANEL
Anion gap: 6 (ref 5–15)
BUN: 15 mg/dL (ref 8–23)
CO2: 26 mmol/L (ref 22–32)
Calcium: 8.3 mg/dL — ABNORMAL LOW (ref 8.9–10.3)
Chloride: 104 mmol/L (ref 98–111)
Creatinine, Ser: 0.95 mg/dL (ref 0.44–1.00)
GFR, Estimated: >60 mL/min (ref >60)
Glucose, Bld: 160 mg/dL — ABNORMAL HIGH (ref 70–99)
Potassium: 4 mmol/L (ref 3.5–5.1)
Sodium: 136 mmol/L (ref 135–145)

## 2020-11-27 MED ORDER — METOPROLOL SUCCINATE ER 25 MG PO TB24: 25.0000 mg | ORAL_TABLET | Freq: Two times a day (BID) | ORAL | 0 refills | Status: DC

## 2020-11-27 NOTE — Patient Instructions (Signed)
Increase Metoprolol to 25 mg Twice daily   Labs done today, your results will be available in MyChart, we will contact you for abnormal readings.  Thank you for allowing [place] to provider your heart failure care after your recent hospitalization. Please follow-up with [facility] as scheduled 12/14/20  Do the following things EVERYDAY: Weigh yourself in the morning before breakfast. Write it down and keep it in a log. Take your medicines as prescribed Eat low salt foods--Limit salt (sodium) to 2000 mg per day.  Stay as active as you can everyday Limit all fluids for the day to less than 2 liters  If you have any questions, issues, or concerns before your next appointment please call our office at [phone], opt. 2 and leave a message for the triage nurse.

## 2020-12-03 ENCOUNTER — Other Ambulatory Visit: Payer: Self-pay

## 2020-12-03 ENCOUNTER — Ambulatory Visit (INDEPENDENT_AMBULATORY_CARE_PROVIDER_SITE_OTHER): Payer: Medicare Other | Admitting: Physician Assistant

## 2020-12-03 ENCOUNTER — Encounter: Payer: Self-pay | Admitting: Orthopedic Surgery

## 2020-12-03 DIAGNOSIS — S81801A Unspecified open wound, right lower leg, initial encounter: Secondary | ICD-10-CM | POA: Diagnosis not present

## 2020-12-03 DIAGNOSIS — I2510 Atherosclerotic heart disease of native coronary artery without angina pectoris: Secondary | ICD-10-CM

## 2020-12-03 DIAGNOSIS — Z9861 Coronary angioplasty status: Secondary | ICD-10-CM

## 2020-12-03 NOTE — Progress Notes (Signed)
Office Visit Note   Patient: Tammy Good           Date of Birth: Nov 29, 1950           MRN: [id number] Visit Date: 12/03/2020              Requested by: [name], MD [address] [place],  [place] [address] PCP: [name], MD  Chief Complaint  Patient presents with   Right Leg - Pain      HPI: Patient is a pleasant 70 year old woman who follows up today for her right leg wound.  She reports that she was recently hospitalized for cardiac issues and was unable to make her appointment as scheduled.  With regards to her leg she thinks it significantly better she is having no pain.  And no concerns  Assessment & Plan: Visit Diagnoses: No diagnosis found.  Plan: Follow-up in 4 weeks.  She should continue Silvadene with a large Band-Aid to cover the area.  New Band-Aid and Silvadene was applied today  Follow-Up Instructions: No follow-ups on file.   Ortho Exam  Patient is alert, oriented, no adenopathy, well-dressed, normal affect, normal respiratory effort. Examination demonstrates healthy wound bed ulcer that measures 1.75 cm x 3 cm and is just below skin surface.  There is good granulation tissue that bleeds easily.  There is no surrounding cellulitis no drainage no foul odor no evidence of any infection.  Imaging: No results found. No images are attached to the encounter.  Labs: Lab Results  Component Value Date   HGBA1C 6.7 (H) 11/15/2020   HGBA1C 6.9 (H) 09/02/2013   ESRSEDRATE 52 (H) 08/30/2013   REPTSTATUS 05/30/2019 FINAL 05/25/2019   GRAMSTAIN NO WBC SEEN NO ORGANISMS SEEN  05/25/2019   CULT  05/25/2019    RARE METHICILLIN RESISTANT STAPHYLOCOCCUS AUREUS RESULT CALLED TO, READ BACK BY AND VERIFIED WITH: RN J BERIS [id number] MLM NO ANAEROBES ISOLATED Performed at [facility], [place] [address], [place], [place] [address]    LABORGA METHICILLIN RESISTANT STAPHYLOCOCCUS AUREUS 05/25/2019     Lab Results  Component Value Date   ALBUMIN  3.0 (L) 11/23/2020   ALBUMIN 2.4 (L) 11/20/2020   ALBUMIN 2.8 (L) 05/23/2019    Lab Results  Component Value Date   MG 2.0 11/16/2020   MG 1.6 (L) 05/30/2019   MG 1.6 (L) 05/27/2019   No results found for: VD25OH  No results found for: PREALBUMIN CBC EXTENDED Latest Ref Rng & Units 11/23/2020 11/22/2020 11/21/2020  WBC 4.0 - 10.5 K/uL 12.5(H) 11.0(H) 11.1(H)  RBC 3.87 - 5.11 MIL/uL 4.28 3.77(L) 3.87  HGB 12.0 - 15.0 g/dL 12.1 10.8(L) 11.0(L)  HCT 36.0 - 46.0 % 40.2 34.7(L) 35.2(L)  PLT 150 - 400 K/uL 511(H) 360 342  NEUTROABS 1.7 - 7.7 K/uL 10.5(H) - -  LYMPHSABS 0.7 - 4.0 K/uL 1.1 - -     There is no height or weight on file to calculate BMI.  Orders:  No orders of the defined types were placed in this encounter.  No orders of the defined types were placed in this encounter.    Procedures: No procedures performed  Clinical Data: No additional findings.  ROS:  All other systems negative, except as noted in the HPI. Review of Systems  Objective: Vital Signs: There were no vitals taken for this visit.  Specialty Comments:  No specialty comments available.  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   PAD (peripheral artery disease) ([place]) 05/26/2019   Osteomyelitis of left foot ([place]) 05/24/2019   Osteomyelitis ([place]) 05/23/2019   Cellulitis and abscess of foot, except toes 05/18/2019   Chronic osteomyelitis of ankle and foot ([place]) 05/18/2019   Cholesteatoma 02/24/2019   Cellulitis and abscess of toe 11/09/2018   Colon cancer screening 11/30/2017   Long term (current) use of insulin ([place]) 03/05/2017    Iron deficiency anemia 12/10/2014   Obesity (BMI 30-39.9) 11/17/2013   Diabetes mellitus type 2 in obese [facility])    Essential hypertension    Right thigh pain 11/01/2013   PVC's (premature ventricular contractions) 11/01/2013   Status post insertion of drug eluting coronary artery stent to [facility] emergently and PTCA to prox. PLA 09/16/2013   Hyperlipidemia associated with type 2 diabetes mellitus ([place]) 09/16/2013   Presence of coronary angioplasty implant and graft 09/08/2013   Panhypopituitarism ([place]) 08/30/2013   Non-ST elevation (NSTEMI) myocardial infarction ([place]) 08/22/2013   CAD S/P percutaneous coronary angioplasty 08/22/2013   Past Medical History:  Diagnosis Date   CAD S/P percutaneous coronary angioplasty 08/2013   100% mRCA - PCI Integrity Resolute DES 3.0 mm x 38 mm - 3.35 mm; PTCA of RPA V 2.0 mm x 15 mm   Cholesteatoma    right   Diabetes mellitus type 2 in obese (HCC)    On insulin and Invokana   History of osteomyelitis L 5th Toe all 05/2019   s/p Partial Ray Amputation with partial closure; 6 wks Abx & LSFA Atherectomy/DEB PTA with Stent for focal dissection.   Hyperlipidemia with target LDL less than 70    Hypothyroidism (acquired)    Mild essential hypertension    Obesity (BMI 30-39.9) 11/17/2013   PAD (peripheral artery disease) ([place]) 05/26/2019   05/26/19: Abd AoGram- BLE runoff -> L SFA orbital atherectomy - PTA w/ DEB & Stent 6 x 40 Luttonix (for focal dissection) - patent Pop A with 3 V runoff. LEA Dopplers 01/03/2020: RABI (prev) 0.68 (0.53)/ RTBI (prev) 0.58 (0.33); LABI (prev) 0.80 (0.64), LTBI (prev) 0.64 (0.51); R mSFA ~50-74%, L mSFA 50-74%. Patent Prox SFA stent < 49% stenosis   Panhypopituitarism (HCC)    ST elevation myocardial infarction (STEMI) of inferior wall, subsequent episode of care ([place]) 08/2013   80% branch of D1, 40% mid AV groove circumflex, 100% RCA with subacute thrombus -- thrombus extending into RPA V with 100% occlusion after initial  angioplasty of mid RCA ;; Post MI ECHO 6/9/'15: EF 50-55%, mild LVH with moderate HK of inferior wall, Gr1 DD, mild LA dilation; mildly reduced RV function    Family History  Problem Relation Age of Onset   Cancer Mother 16       multiple myeloma   Heart attack Father 22   Cancer Sister    Alzheimer's disease Maternal Grandmother     Past Surgical History:  Procedure Laterality Date   ABDOMINAL AORTOGRAM W/LOWER EXTREMITY N/A 05/26/2019   Procedure: ABDOMINAL AORTOGRAM W/LOWER EXTREMITY;  Surgeon: Marty Heck, MD;  Location: [place] CV LAB;  Service: Cardiovascular;  Laterality: N/A;   ABDOMINAL AORTOGRAM W/LOWER EXTREMITY N/A 11/15/2020   Procedure: ABDOMINAL AORTOGRAM W/LOWER EXTREMITY;  Surgeon: Marty Heck, MD;  Location: [place] CV LAB;  Service: Cardiovascular;  Laterality: N/A;   AMPUTATION Left 05/25/2019   Procedure: AMPUTATION RAY 5th;  Surgeon: Trula Slade, DPM;  Location: [place];  Service: Podiatry;  Laterality: Left;   Cardiac Event Monitor  July-August 2015   Sinus rhythm with PVCs   COLONOSCOPY N/A 08/31/2013   Procedure: COLONOSCOPY;  Surgeon: Juanita Craver, MD;  Location: [facility] ENDOSCOPY;  Service: Endoscopy;  Laterality: N/A;   CORONARY STENT INTERVENTION N/A 11/19/2020   Procedure: CORONARY STENT INTERVENTION;  Surgeon: Nelva Bush, MD;  Location: [place] CV LAB;  Service: Cardiovascular;  Laterality: N/A;   ESOPHAGOGASTRODUODENOSCOPY N/A 09/01/2013   Procedure: ESOPHAGOGASTRODUODENOSCOPY (EGD);  Surgeon: Beryle Beams, MD;  Location: [facility] ENDOSCOPY;  Service: Endoscopy;  Laterality: N/A;  bedside   LEFT HEART CATH AND CORONARY ANGIOGRAPHY N/A 11/19/2020   Procedure: LEFT HEART CATH AND CORONARY ANGIOGRAPHY;  Surgeon: Nelva Bush, MD;  Location: [place] CV LAB;  Service: Cardiovascular;  Laterality: N/A;   LEFT HEART CATHETERIZATION WITH CORONARY ANGIOGRAM N/A 08/30/2013   Procedure: LEFT HEART CATHETERIZATION WITH CORONARY  ANGIOGRAM;  Surgeon: Leonie Man, MD;  Location: [facility] CATH LAB: 100% mRCA (thrombus - extends to RPAV), 80% D1, 40% AVG Cx.   PERCUTANEOUS CORONARY STENT INTERVENTION (PCI-S)  08/30/2013   Procedure: PERCUTANEOUS CORONARY STENT INTERVENTION (PCI-S);  Surgeon: Leonie Man, MD;  Location: [facility] CATH LAB;  Integrity Resolute DES 2.0 mm x 38 mm -- 3.35 mm.; PTCA of proximal RPA V. - 3.0 mm x 15 mm balloon   PERIPHERAL VASCULAR ATHERECTOMY  05/26/2019   Procedure: PERIPHERAL VASCULAR ATHERECTOMY;  Surgeon: Marty Heck, MD;  Location: [place] CV LAB;  Service: Cardiovascular;;  Left SFA   PERIPHERAL VASCULAR INTERVENTION  05/26/2019   Procedure: PERIPHERAL VASCULAR INTERVENTION;  Surgeon: Marty Heck, MD;  Location: [place] CV LAB;  Service: Cardiovascular;;  Left SFA   PERIPHERAL VASCULAR INTERVENTION Right 11/15/2020   Procedure: PERIPHERAL VASCULAR INTERVENTION;  Surgeon: Marty Heck, MD;  Location: [place] CV LAB;  Service: Cardiovascular;  Laterality: Right;  Superficial Femoral Artery   PITUITARY SURGERY     TRANSTHORACIC ECHOCARDIOGRAM  08/30/2013   mild LVH. EF 50-55%. Moderate HK of the entire inferior myocardium. GR 1 DD. Mild LA dilation. Mildly reduced RV function   TYMPANOMASTOIDECTOMY Right 12/28/2017   Procedure: RIGHT TYMPANOMASTOIDECTOMY;  Surgeon: Leta Baptist, MD;  Location: [place];  Service: ENT;  Laterality: Right;   Social History   Occupational History   Not on file  Tobacco Use   Smoking status: Former   Smokeless tobacco: Never  Substance and Sexual Activity   Alcohol use: No   Drug use: No   Sexual activity: Not Currently    Birth control/protection: Post-menopausal

## 2020-12-11 ENCOUNTER — Other Ambulatory Visit: Payer: Self-pay

## 2020-12-11 ENCOUNTER — Ambulatory Visit (INDEPENDENT_AMBULATORY_CARE_PROVIDER_SITE_OTHER): Payer: Medicare Other | Admitting: Podiatry

## 2020-12-11 ENCOUNTER — Encounter: Payer: Self-pay | Admitting: Podiatry

## 2020-12-11 DIAGNOSIS — Q828 Other specified congenital malformations of skin: Secondary | ICD-10-CM

## 2020-12-11 DIAGNOSIS — M79675 Pain in left toe(s): Secondary | ICD-10-CM

## 2020-12-11 DIAGNOSIS — E119 Type 2 diabetes mellitus without complications: Secondary | ICD-10-CM

## 2020-12-11 DIAGNOSIS — B351 Tinea unguium: Secondary | ICD-10-CM

## 2020-12-11 DIAGNOSIS — M79674 Pain in right toe(s): Secondary | ICD-10-CM | POA: Diagnosis not present

## 2020-12-11 NOTE — Progress Notes (Signed)
Subjective: 70 y.o. returns the office today for painful, elongated, thickened toenails which she cannot trim herself.  She also gets calluses to her left foot and she is to get a corn left fourth toe as well as the right lateral foot.  Since I last saw her she had a laceration to her right leg which is being treated by [name].  She is also had intervention performed by vascular surgery.  PCP: [name], MD  Objective: AAO 3, NAD DP/PT pulses palpable, CRT less than 3 seconds Sensation decreased with Semmes Weinstein monofilament. Protective sensation decreased with Derrel Nip monofilament Nails hypertrophic, dystrophic, elongated, brittle, discolored 9. There is tenderness overlying the nails 1-5 bilaterally except for the left fifth toe which is been amputated. There is no surrounding Hyperkeratotic tissue present on the plantar aspect of the amputation site without any underlying ulceration drainage or signs of infection. New hyperkeratotic lesion dorsal lateral aspect of the fourth digit left foot as well as the right fifth metatarsal base without any underlying ulceration drainage or any signs of infection. No open lesions or pre-ulcerative lesions are identified. No pain with calf compression, swelling, warmth, erythema.  Assessment: Patient presents with symptomatic onychomycosis, preulcerative callus  Plan: -Treatment options including alternatives, risks, complications were discussed -Nails sharply debrided 9 without complication/bleeding. -Hyperkeratotic lesion sharply debrided x3 without any complications or bleeding. -Continue diabetic shoes. -Discussed daily foot inspection. If there are any changes, to call the office immediately.  -Follow-up in 3 months or sooner if any problems are to arise. In the meantime, encouraged to call the office with any questions, concerns, changes symptoms.  Celesta Gentile, DPM

## 2020-12-12 NOTE — Progress Notes (Deleted)
Cardiology Office Note   Date:  12/12/2020   ID:  Tammy Good, DOB 13-Apr-1950, MRN [id number]  PCP:  [name], MD  Cardiologist: Dr. Ellyn Hack No chief complaint on file.    History of Present Illness: Tammy Good is a 70 y.o. female who presents for ongoing assessment and management of coronary artery disease with STEMI in 2015 and stent to the RCA, frequent PVCs, hypertension, hyperlipidemia, type 2 diabetes, and PAD.  She was admitted on 11/15/2020 for critical limb ischemia of the right lower extremity.  She underwent right mid to distal SFA and above-the-knee popliteal artery angioplasty with stent placement for a nonhealing Wound.  Unfortunately the patient had a non-STEMI right after the right SFA, and had cardiac catheterization with PCI to the proximal left circumflex.  She also developed atrial fibrillation during admission but did convert to normal sinus rhythm shortly after.  She was started on Eliquis 5 mg twice daily, Plavix 75 mg daily, Farxiga 10 mg daily, losartan 25 mg daily, Toprol XL 25 mg daily and spironolactone 25 mg daily.  Echocardiogram completed on 11/16/2020 revealed an EF of 40% to 45% with right wall motion abnormalities, and severe hypokinesis of the anterior inferior lateral and inferior wall.    Past Medical History:  Diagnosis Date   CAD S/P percutaneous coronary angioplasty 08/2013   100% mRCA - PCI Integrity Resolute DES 3.0 mm x 38 mm - 3.35 mm; PTCA of RPA V 2.0 mm x 15 mm   Cholesteatoma    right   Diabetes mellitus type 2 in obese (HCC)    On insulin and Invokana   History of osteomyelitis L 5th Toe all 05/2019   s/p Partial Ray Amputation with partial closure; 6 wks Abx & LSFA Atherectomy/DEB PTA with Stent for focal dissection.   Hyperlipidemia with target LDL less than 70    Hypothyroidism (acquired)    Mild essential hypertension    Obesity (BMI 30-39.9) 11/17/2013   PAD (peripheral artery disease) ([place]) 05/26/2019   05/26/19: Abd  AoGram- BLE runoff -> L SFA orbital atherectomy - PTA w/ DEB & Stent 6 x 40 Luttonix (for focal dissection) - patent Pop A with 3 V runoff. LEA Dopplers 01/03/2020: RABI (prev) 0.68 (0.53)/ RTBI (prev) 0.58 (0.33); LABI (prev) 0.80 (0.64), LTBI (prev) 0.64 (0.51); R mSFA ~50-74%, L mSFA 50-74%. Patent Prox SFA stent < 49% stenosis   Panhypopituitarism (HCC)    ST elevation myocardial infarction (STEMI) of inferior wall, subsequent episode of care ([place]) 08/2013   80% branch of D1, 40% mid AV groove circumflex, 100% RCA with subacute thrombus -- thrombus extending into RPA V with 100% occlusion after initial angioplasty of mid RCA ;; Post MI ECHO 6/9/'15: EF 50-55%, mild LVH with moderate HK of inferior wall, Gr1 DD, mild LA dilation; mildly reduced RV function    Past Surgical History:  Procedure Laterality Date   ABDOMINAL AORTOGRAM W/LOWER EXTREMITY N/A 05/26/2019   Procedure: ABDOMINAL AORTOGRAM W/LOWER EXTREMITY;  Surgeon: Marty Heck, MD;  Location: [place] [facility];  Service: Cardiovascular;  Laterality: N/A;   ABDOMINAL AORTOGRAM W/LOWER EXTREMITY N/A 11/15/2020   Procedure: ABDOMINAL AORTOGRAM W/LOWER EXTREMITY;  Surgeon: Marty Heck, MD;  Location: [place] [facility];  Service: Cardiovascular;  Laterality: N/A;   AMPUTATION Left 05/25/2019   Procedure: AMPUTATION RAY 5th;  Surgeon: Trula Slade, DPM;  Location: [place];  Service: Podiatry;  Laterality: Left;   Cardiac Event Monitor  July-August 2015  Sinus rhythm with PVCs   COLONOSCOPY N/A 08/31/2013   Procedure: COLONOSCOPY;  Surgeon: Juanita Craver, MD;  Location[facility] ENDOSCOPY;  Service: Endoscopy;  Laterality: N/A;   CORONARY STENT INTERVENTION N/A 11/19/2020   Procedure: CORONARY STENT INTERVENTION;  Surgeon: Nelva Bush, MD;  Location: [place] [facility];  Service: Cardiovascular;  Laterality: N/A;   ESOPHAGOGASTRODUODENOSCOPY N/A 09/01/2013   Procedure: ESOPHAGOGASTRODUODENOSCOPY (EGD);  Surgeon: Beryle Beams, MD;  Location: [facility] ENDOSCOPY;  Service: Endoscopy;  Laterality: N/A;  bedside   LEFT HEART CATH AND CORONARY ANGIOGRAPHY N/A 11/19/2020   Procedure: LEFT HEART CATH AND CORONARY ANGIOGRAPHY;  Surgeon: Nelva Bush, MD;  Location: [place] [facility];  Service: Cardiovascular;  Laterality: N/A;   LEFT HEART CATHETERIZATION WITH CORONARY ANGIOGRAM N/A 08/30/2013   Procedure: LEFT HEART CATHETERIZATION WITH CORONARY ANGIOGRAM;  Surgeon: Leonie Man, MD;  Location[facility] CATH LAB: 100% mRCA (thrombus - extends to RPAV), 80% D1, 40% AVG Cx.   PERCUTANEOUS CORONARY STENT INTERVENTION (PCI-S)  08/30/2013   Procedure: PERCUTANEOUS CORONARY STENT INTERVENTION (PCI-S);  Surgeon: Leonie Man, MD;  Location[facility] CATH LAB;  Integrity Resolute DES 2.0 mm x 38 mm -- 3.35 mm.; PTCA of proximal RPA V. - 3.0 mm x 15 mm balloon   PERIPHERAL VASCULAR ATHERECTOMY  05/26/2019   Procedure: PERIPHERAL VASCULAR ATHERECTOMY;  Surgeon: Marty Heck, MD;  Location: [place] [facility];  Service: Cardiovascular;;  Left SFA   PERIPHERAL VASCULAR INTERVENTION  05/26/2019   Procedure: PERIPHERAL VASCULAR INTERVENTION;  Surgeon: Marty Heck, MD;  Location: [place] [facility];  Service: Cardiovascular;;  Left SFA   PERIPHERAL VASCULAR INTERVENTION Right 11/15/2020   Procedure: PERIPHERAL VASCULAR INTERVENTION;  Surgeon: Marty Heck, MD;  Location: [place] [facility];  Service: Cardiovascular;  Laterality: Right;  Superficial Femoral Artery   PITUITARY SURGERY     TRANSTHORACIC ECHOCARDIOGRAM  08/30/2013   mild LVH. EF 50-55%. Moderate HK of the entire inferior myocardium. GR 1 DD. Mild LA dilation. Mildly reduced RV function   TYMPANOMASTOIDECTOMY Right 12/28/2017   Procedure: RIGHT TYMPANOMASTOIDECTOMY;  Surgeon: Leta Baptist, MD;  Location: [place];  Service: ENT;  Laterality: Right;     Current Outpatient Medications  Medication Sig Dispense Refill   apixaban (ELIQUIS) 5 MG  TABS tablet Take 1 tablet (5 mg total) by mouth 2 (two) times daily. 60 tablet 6   atorvastatin (LIPITOR) 40 MG tablet Take 1 tablet (40 mg total) by mouth daily at 6 PM. 30 tablet 0   B-D UF III MINI PEN NEEDLES 31G X 5 MM MISC Inject into the skin.     clopidogrel (PLAVIX) 75 MG tablet Take 1 tablet by mouth once daily with breakfast 90 tablet 0   Continuous Blood Gluc Sensor (FREESTYLE LIBRE 2 SENSOR) MISC Apply topically every 14 (fourteen) days.     dexamethasone (DECADRON) 0.5 MG tablet Take 0.5 mg by mouth daily.     FARXIGA 10 MG TABS tablet Take 10 mg by mouth daily.      ferrous sulfate 325 (65 FE) MG tablet Take 325 mg by mouth daily with breakfast.     Insulin Glargine (BASAGLAR KWIKPEN) 100 UNIT/ML SOPN Inject 48 Units into the skin at bedtime.     levothyroxine (SYNTHROID) 125 MCG tablet Take 1 tablet (125 mcg total) by mouth daily before breakfast. 30 tablet 2   losartan (COZAAR) 25 MG tablet Take 1 tablet (25 mg total) by mouth daily. 30 tablet 11  metoprolol succinate (TOPROL-XL) 25 MG 24 hr tablet Take 1 tablet (25 mg total) by mouth in the morning and at bedtime. 60 tablet 0   nitroGLYCERIN (NITROSTAT) 0.4 MG SL tablet Place 1 tablet (0.4 mg total) under the tongue every 5 (five) minutes as needed for chest pain (up to 3 doses. If taking 3rd dose call 911). 25 tablet 3   NOVOLOG FLEXPEN 100 UNIT/ML FlexPen Inject 5-15 Units into the skin 3 (three) times daily with meals. Per sliding scale     nystatin cream (MYCOSTATIN) Apply 1 application topically 2 (two) times daily. (Patient taking differently: Apply 1 application topically 2 (two) times daily as needed (rash).) 30 g 1   Omega-3 Fatty Acids (FISH OIL) 1000 MG CAPS Take 1,000 mg by mouth daily.     ONETOUCH VERIO test strip   1   pantoprazole (PROTONIX) 40 MG tablet Take 1 tablet (40 mg total) by mouth 2 (two) times daily before a meal. 60 tablet 0   Propylene Glycol (SYSTANE COMPLETE) 0.6 % SOLN Place 1 drop into both eyes  daily as needed (dry eyes).     silver sulfADIAZINE (SILVADENE) 1 % cream Apply 1 application topically daily. Apply thin layer to leg wound daily 50 g 0   spironolactone (ALDACTONE) 25 MG tablet Take 1 tablet (25 mg total) by mouth daily. 30 tablet 0   No current facility-administered medications for this visit.    Allergies:   Strawberry extract    Social History:  The patient  reports that she has quit smoking. She has never used smokeless tobacco. She reports that she does not drink alcohol and does not use drugs.   Family History:  The patient's family history includes Alzheimer's disease in her maternal grandmother; Cancer in her sister; Cancer (age of onset: 18) in her mother; Heart attack (age of onset: 53) in her father.    ROS: All other systems are reviewed and negative. Unless otherwise mentioned in H&P    PHYSICAL EXAM: VS:  There were no vitals taken for this visit. , BMI There is no height or weight on file to calculate BMI. GEN: Well nourished, well developed, in no acute distress HEENT: normal Neck: no JVD, carotid bruits, or masses Cardiac: ***RRR; no murmurs, rubs, or gallops,no edema  Respiratory:  Clear to auscultation bilaterally, normal work of breathing GI: soft, nontender, nondistended, + BS MS: no deformity or atrophy Skin: warm and dry, no rash Neuro:  Strength and sensation are intact Psych: euthymic mood, full affect   EKG:  EKG {ACTION; IS/IS [id number]} ordered today. The ekg ordered today demonstrates ***   Recent Labs: 11/16/2020: B Natriuretic Peptide 217.2; Magnesium 2.0; TSH <0.010 11/23/2020: ALT 16; Hemoglobin 12.1; Platelets 511 11/27/2020: BUN 15; Creatinine, Ser 0.95; Potassium 4.0; Sodium 136    Lipid Panel    Component Value Date/Time   CHOL 87 11/19/2020 0152   TRIG 85 11/19/2020 0152   HDL 24 (L) 11/19/2020 0152   CHOLHDL 3.6 11/19/2020 0152   VLDL 17 11/19/2020 0152   LDLCALC 46 11/19/2020 0152      Wt Readings from Last  3 Encounters:  11/27/20 227 lb 8 oz (103.2 kg)  11/23/20 230 lb (104.3 kg)  11/15/20 230 lb (104.3 kg)      Other studies Reviewed:  [facility] 11/19/2020 Conclusions: Severe two-vessel coronary artery disease including 95% proximal LCx stenosis and multifocal D1 disease of up to 90%. Mild to moderate LAD and RCA disease. Patent RCA stent. Mildly  elevated left ventricular filling pressure (LVEDP ~20 mmHg). Challenging but successful PCI to proximal LCx using Onyx Frontier 2.5 x 12 mm DES with 0% residual stenosis and TIMI-3 flow.   Recommendations: Restart IV heparin 2 hours after TR band removal. If no evidence of bleeding or vascular injury, consider transitioning to clopidogrel + DOAC as soon as tomorrow at the discretion of the rounding cardiology team given h/o PAF this admission. Aggressive secondary prevention. Medical management of diagonal disease, as the diseased portions of the vessel are too small for PCI.  Echocardiogram 11/16/2020 1. Left ventricular ejection fraction, by estimation, is 40 to 45%. The  left ventricle has mildly decreased function. The left ventricle  demonstrates regional wall motion abnormalities (see scoring  diagram/findings for description). Left ventricular  diastolic parameters are consistent with Grade I diastolic dysfunction  (impaired relaxation). There is severe hypokinesis of the left  ventricular, entire inferolateral wall and inferior wall.   2. Right ventricular systolic function is normal. The right ventricular  size is normal. Tricuspid regurgitation signal is inadequate for assessing  PA pressure.   3. Left atrial size was moderately dilated.   4. The mitral valve is grossly normal. No evidence of mitral valve  regurgitation.   5. The aortic valve was not well visualized. Aortic valve regurgitation  is not visualized.   6. The inferior vena cava is normal in size with greater than 50%  respiratory variability, suggesting right atrial  pressure of 3 mmHg.   ASSESSMENT AND PLAN:  1.  ***   Current medicines are reviewed at length with the patient today.  I have spent *** dedicated to the care of this patient on the date of this encounter to include pre-visit review of records, assessment, management and diagnostic testing,with shared decision making.  Labs/ tests ordered today include: *** Phill Myron. [place] Pugh, ANP, AACC   12/12/2020 4:53 PM    [place] [address] Office [phone] Fax [phone]  Notice: This dictation was prepared with Dragon dictation along with smaller phrase technology. Any transcriptional errors that result from this process are unintentional and may not be corrected upon review.

## 2020-12-13 ENCOUNTER — Telehealth: Payer: Self-pay

## 2020-12-13 NOTE — Telephone Encounter (Signed)
Called patient to advise appointment will need to be canceled due provider K. Purcell Nails out sick.

## 2020-12-14 ENCOUNTER — Ambulatory Visit: Payer: Medicare Other | Admitting: Adult Health

## 2020-12-17 ENCOUNTER — Other Ambulatory Visit: Payer: Self-pay

## 2020-12-17 ENCOUNTER — Ambulatory Visit: Payer: Medicare Other | Attending: Endocrinology | Admitting: Physical Therapy

## 2020-12-17 VITALS — BP 169/83 | HR 95

## 2020-12-17 DIAGNOSIS — R29898 Other symptoms and signs involving the musculoskeletal system: Secondary | ICD-10-CM | POA: Insufficient documentation

## 2020-12-17 DIAGNOSIS — G8929 Other chronic pain: Secondary | ICD-10-CM | POA: Diagnosis present

## 2020-12-17 DIAGNOSIS — M5440 Lumbago with sciatica, unspecified side: Secondary | ICD-10-CM | POA: Insufficient documentation

## 2020-12-17 DIAGNOSIS — R600 Localized edema: Secondary | ICD-10-CM | POA: Diagnosis present

## 2020-12-17 DIAGNOSIS — M6281 Muscle weakness (generalized): Secondary | ICD-10-CM | POA: Insufficient documentation

## 2020-12-17 NOTE — Therapy (Signed)
[place], [place], [address] Phone: [phone]   Fax:  [phone]  Physical Therapy Evaluation  Patient Details  Name: Tammy Good MRN: [id number] Date of Birth: 1950/09/17 Referring Provider (PT): Dr. Monica Martinez (Dr Sharol Given for wound)   Encounter Date: 12/17/2020   PT End of Session - 12/17/20 1200     Visit Number 1    Number of Visits 8    Date for PT Re-Evaluation 02/11/21    Authorization Type MCR, BCBS    PT Start Time 1003    PT Stop Time 1046    PT Time Calculation (min) 43 min    Activity Tolerance Patient tolerated treatment well    Behavior During Therapy [facility] for tasks assessed/performed             Past Medical History:  Diagnosis Date   CAD S/P percutaneous coronary angioplasty 08/2013   100% mRCA - PCI Integrity Resolute DES 3.0 mm x 38 mm - 3.35 mm; PTCA of RPA V 2.0 mm x 15 mm   Cholesteatoma    right   Diabetes mellitus type 2 in obese (HCC)    On insulin and Invokana   History of osteomyelitis L 5th Toe all 05/2019   s/p Partial Ray Amputation with partial closure; 6 wks Abx & LSFA Atherectomy/DEB PTA with Stent for focal dissection.   Hyperlipidemia with target LDL less than 70    Hypothyroidism (acquired)    Mild essential hypertension    Obesity (BMI 30-39.9) 11/17/2013   PAD (peripheral artery disease) ([place]) 05/26/2019   05/26/19: Abd AoGram- BLE runoff -> L SFA orbital atherectomy - PTA w/ DEB & Stent 6 x 40 Luttonix (for focal dissection) - patent Pop A with 3 V runoff. LEA Dopplers 01/03/2020: RABI (prev) 0.68 (0.53)/ RTBI (prev) 0.58 (0.33); LABI (prev) 0.80 (0.64), LTBI (prev) 0.64 (0.51); R mSFA ~50-74%, L mSFA 50-74%. Patent Prox SFA stent < 49% stenosis   Panhypopituitarism (HCC)    ST elevation myocardial infarction (STEMI) of inferior wall, subsequent episode of care ([place]) 08/2013   80% branch of D1, 40% mid AV groove circumflex, 100% RCA with subacute  thrombus -- thrombus extending into RPA V with 100% occlusion after initial angioplasty of mid RCA ;; Post MI ECHO 6/9/'15: EF 50-55%, mild LVH with moderate HK of inferior wall, Gr1 DD, mild LA dilation; mildly reduced RV function    Past Surgical History:  Procedure Laterality Date   ABDOMINAL AORTOGRAM W/LOWER EXTREMITY N/A 05/26/2019   Procedure: ABDOMINAL AORTOGRAM W/LOWER EXTREMITY;  Surgeon: Marty Heck, MD;  Location: [place] CV LAB;  Service: Cardiovascular;  Laterality: N/A;   ABDOMINAL AORTOGRAM W/LOWER EXTREMITY N/A 11/15/2020   Procedure: ABDOMINAL AORTOGRAM W/LOWER EXTREMITY;  Surgeon: Marty Heck, MD;  Location: [place] CV LAB;  Service: Cardiovascular;  Laterality: N/A;   AMPUTATION Left 05/25/2019   Procedure: AMPUTATION RAY 5th;  Surgeon: Trula Slade, DPM;  Location: [place];  Service: Podiatry;  Laterality: Left;   Cardiac Event Monitor  July-August 2015   Sinus rhythm with PVCs   COLONOSCOPY N/A 08/31/2013   Procedure: COLONOSCOPY;  Surgeon: Juanita Craver, MD;  Location: [facility] ENDOSCOPY;  Service: Endoscopy;  Laterality: N/A;   CORONARY STENT INTERVENTION N/A 11/19/2020   Procedure: CORONARY STENT INTERVENTION;  Surgeon: Nelva Bush, MD;  Location: [place] [facility] LAB;  Service: Cardiovascular;  Laterality: N/A;   ESOPHAGOGASTRODUODENOSCOPY N/A 09/01/2013   Procedure: ESOPHAGOGASTRODUODENOSCOPY (EGD);  Surgeon: Saralyn Pilar  Renee Ramus, MD;  Location: [place];  Service: Endoscopy;  Laterality: N/A;  bedside   LEFT HEART CATH AND CORONARY ANGIOGRAPHY N/A 11/19/2020   Procedure: LEFT HEART CATH AND CORONARY ANGIOGRAPHY;  Surgeon: Nelva Bush, MD;  Location: [place] CV LAB;  Service: Cardiovascular;  Laterality: N/A;   LEFT HEART CATHETERIZATION WITH CORONARY ANGIOGRAM N/A 08/30/2013   Procedure: LEFT HEART CATHETERIZATION WITH CORONARY ANGIOGRAM;  Surgeon: Leonie Man, MD;  Location: [facility] CATH LAB: 100% mRCA (thrombus - extends to RPAV), 80%  D1, 40% AVG Cx.   PERCUTANEOUS CORONARY STENT INTERVENTION (PCI-S)  08/30/2013   Procedure: PERCUTANEOUS CORONARY STENT INTERVENTION (PCI-S);  Surgeon: Leonie Man, MD;  Location: [facility] CATH LAB;  Integrity Resolute DES 2.0 mm x 38 mm -- 3.35 mm.; PTCA of proximal RPA V. - 3.0 mm x 15 mm balloon   PERIPHERAL VASCULAR ATHERECTOMY  05/26/2019   Procedure: PERIPHERAL VASCULAR ATHERECTOMY;  Surgeon: Marty Heck, MD;  Location: [place] [facility] LAB;  Service: Cardiovascular;;  Left SFA   PERIPHERAL VASCULAR INTERVENTION  05/26/2019   Procedure: PERIPHERAL VASCULAR INTERVENTION;  Surgeon: Marty Heck, MD;  Location: [place] CV LAB;  Service: Cardiovascular;;  Left SFA   PERIPHERAL VASCULAR INTERVENTION Right 11/15/2020   Procedure: PERIPHERAL VASCULAR INTERVENTION;  Surgeon: Marty Heck, MD;  Location: [place] [facility] LAB;  Service: Cardiovascular;  Laterality: Right;  Superficial Femoral Artery   PITUITARY SURGERY     TRANSTHORACIC ECHOCARDIOGRAM  08/30/2013   mild LVH. EF 50-55%. Moderate HK of the entire inferior myocardium. GR 1 DD. Mild LA dilation. Mildly reduced RV function   TYMPANOMASTOIDECTOMY Right 12/28/2017   Procedure: RIGHT TYMPANOMASTOIDECTOMY;  Surgeon: Leta Baptist, MD;  Location: [place];  Service: ENT;  Laterality: Right;    Vitals:   12/17/20 1038 12/17/20 1039  BP: (!) 152/73 (!) 169/83  Pulse: 95 95      Subjective Assessment - 12/17/20 1006     Subjective Patient recently underwent vascular surgery 11/22/20 (stent) and is referred by vein specialist.  She walks with cane sometimes.  She has back and hip pain ever since 2015. She has difficulty walking and standing any length of time  . She did go to the chiropractor before the pandemic but has not returned to that.  She iniitally cut her leg on the car door and it developed and did not heal.  After surgery the wound began to heal. She sees [name] for her leg wound but not her  hip/back.    Pertinent History L toe amputation, HTN, stents in 2015. hospitalized 11/15/20. with MI, hematoma (dropped BP)    Limitations Lifting;Standing;Walking;House hold activities    How long can you stand comfortably? hard to even do dishes due to pain , has to hold on and flex forward    How long can you walk comfortably? mod to high effort for 2 min walk today , pain 8/10 today    Diagnostic tests no XR or MRI known    Patient Stated Goals Patient wants to be abel to walk better, stand for longer without leaning.    Currently in Pain? Yes    Pain Score 8     Pain Location Back    Pain Orientation Right;Lower    Pain Descriptors / Indicators Aching    Pain Type Chronic pain    Pain Radiating Towards R groin +    Pain Onset More than a month ago    Pain  Frequency Constant    Aggravating Factors  walking, standing    Pain Relieving Factors sitting, Aleve at times    Effect of Pain on Daily Activities difficulty with home tasks, walking , unable to be as active as she knows she needs to be    Multiple Pain Sites No                [facility] PT Assessment - 12/17/20 0001       Assessment   Medical Diagnosis PAD    Referring Provider (PT) Dr. Monica Martinez   Dr Sharol Given for wound   Onset Date/Surgical Date 11/15/20    Next MD Visit various follow ups in Oct.    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      Cognition   Overall Cognitive Status Within Functional Limits for tasks assessed      Observation/Other Assessments   Skin Integrity wearing a bandage on R LE wound    Focus on Therapeutic Outcomes (FOTO)  42%      Sensation   Light Touch Impaired by gross assessment    Additional Comments has neuropathy, feet feel tight      Coordination   Gross Motor Movements are Fluid and Coordinated Not tested      Posture/Postural Control   Posture Comments stands and moves with wide BOS      PROM   Overall PROM Comments Rt hip pain end range ER and IR      Strength   Right Hip Flexion 4/5    Right Hip ABduction 4-/5    Left Hip Flexion 4+/5    Left Hip ABduction 4-/5    Right Knee Flexion 5/5    Right Knee Extension 4/5    Left Knee Flexion 5/5    Left Knee Extension 4/5      Palpation   Palpation comment pain low lumbar into Rt glute med and piriformis      Special Tests   Other special tests LAD of Rt LE reduced pain      Transfers   Five time sit to stand comments  27 sec needed UE on wide knees, back pain      Ambulation/Gait   Gait Comments 2 min walk test 220 feet               Objective measurements completed on examination: See above findings.                PT Education - 12/17/20 1059     Education Details PT/POC, venues of care, HEP , general deconditioning    Person(s) Educated Patient    Methods Explanation;Handout;Verbal cues  Comprehension Verbalized understanding;Returned demonstration              PT Short Term Goals - 12/17/20 1202       PT SHORT TERM GOAL #1   Title Pt will be I with HEP for general strength and conditioning    Time 4    Period Weeks    Status New    Target Date 01/14/21      PT SHORT TERM GOAL #2   Title Pt will report less Rt hip and back pain with standing tasks in her home, avg 5/10    Time 4    Period Weeks    Status New    Target Date 01/14/21      PT SHORT TERM  GOAL #3   Title Pt will be able to walk > 250 feet for 2 min walk test due to improved endurance    Baseline 220    Time 4    Period Weeks    Status New    Target Date 01/14/21               PT Long Term Goals - 12/17/20 1204       PT LONG TERM GOAL #1   Title pt will be I with HEP upon discharge for more comprehensive program for back and hip    Time 8    Period Weeks    Status New    Target Date 02/11/21      PT LONG TERM GOAL #2   Title Pt will be able to stand without UEs x 5 in < 30 sec    Time 8    Period Weeks    Status New    Target Date 02/11/21      PT LONG TERM GOAL #3   Title Pt will be able to walk in the community with improved gait, limp for 500 feet and no device.    Time 8    Period Weeks    Status New    Target Date 02/11/21      PT LONG TERM GOAL #4   Title Pt will be able to get on and off the mat table without increased pain    Time 8    Period Weeks    Status New    Target Date 02/11/21                    Plan - 12/17/20 1036     Clinical Impression Statement Patient referred for PAD for low complexity eval.  Spoke with clinical staff at Dr. Ainsley Spinner office and the referral for supposed to be for cardiac rehab.  She does see [name] in the coming weeks 12/31/20.  Will see her until then for strengthening and general conditioning and in the meantime, reach out to [facility] to see if [name] can write a referra specifically for back and hip pain.    Personal Factors and Comorbidities Comorbidity 3+;Time since onset of injury/illness/exacerbation    Comorbidities HTN, recent stent, chronic back pain    Examination-Activity Limitations Transfers;Bed Mobility;Bend;Lift;Squat;Locomotion Level;Stairs;Carry;Stand    Examination-Participation Restrictions Interpersonal Relationship;Cleaning;Community Activity;Shop    Stability/Clinical Decision Making Stable/Uncomplicated    Clinical Decision Making Low    Rehab Potential Excellent     PT Frequency 1x / week    PT Duration 8 weeks    PT Treatment/Interventions ADLs/Self Care Home Management;Gait training;Therapeutic exercise;Patient/family education;Manual techniques;Passive range of motion;Balance training;Moist Heat;Therapeutic activities;Functional mobility training;Cryotherapy;Dealer  Stimulation    PT Next Visit Plan cardiac rehab vs Ortho vs primary care? check HEP, Nustep, walking, etc..    PT Home Exercise Plan Access Code: [email or web address]  URL: [email or web address]  Date: 12/17/2020  Prepared by: Raeford Razor    Exercises  Supine Lower Trunk Rotation - 2 x daily - 7 x weekly - 2 sets - 10 reps - 10 hold  Supine Bridge - 1 x daily - 7 x weekly - 2 sets - 10 reps - 5 hold  Supine Active Straight Leg Raise - 1 x daily - 7 x weekly - 2 sets - 10 reps - 5 hold  Modified Thomas Stretch - 1 x daily - 7 x weekly - 1 sets - 3 reps - 30 hold  Sit to Stand Without Arm Support - 2 x daily - 7 x weekly - 2 sets - 10 reps - 5 hold    Consulted and Agree with Plan of Care Patient             Patient will benefit from skilled therapeutic intervention in order to improve the following deficits and impairments:  Cardiopulmonary status limiting activity, Decreased activity tolerance, Decreased strength, Increased fascial restricitons, Impaired flexibility, Pain, Postural dysfunction, Obesity, Increased edema, Decreased range of motion, Decreased endurance, Decreased mobility, Difficulty walking, Abnormal gait  Visit Diagnosis: Other symptoms and signs involving the musculoskeletal system  Muscle weakness (generalized)  Chronic left-sided low back pain with sciatica, sciatica laterality unspecified  Localized edema     Problem List Patient Active Problem List   Diagnosis Date Noted   Fatty liver 06/19/2020   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 ([place]) 70/03/7492   Atherosclerotic heart disease of native coronary artery without angina pectoris  03/20/2020   Epigastric pain 03/20/2020   Nausea and vomiting 03/20/2020   Absence of toe ([place]) 06/28/2019   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   PAD (peripheral artery disease) ([place]) 05/26/2019   Osteomyelitis of left foot ([place]) 05/24/2019   Osteomyelitis ([place]) 05/23/2019   Cellulitis and abscess of foot, except toes 05/18/2019   Chronic osteomyelitis of ankle and foot ([place]) 05/18/2019   Cholesteatoma 02/24/2019   Cellulitis and abscess of toe 11/09/2018   Colon cancer screening 11/30/2017   Long term (current) use of insulin ([place]) 03/05/2017   Iron deficiency anemia 12/10/2014   Obesity (BMI 30-39.9) 11/17/2013   Diabetes mellitus type 2 in obese [facility])    Essential hypertension    Right thigh pain 11/01/2013   PVC's (premature ventricular contractions) 11/01/2013   Status post insertion of drug eluting coronary artery stent to [facility] emergently and PTCA to prox. PLA 09/16/2013   Hyperlipidemia associated with type 2 diabetes mellitus ([place]) 09/16/2013   Presence of coronary angioplasty implant and graft 09/08/2013   Panhypopituitarism ([place]) 08/30/2013   Non-ST elevation (NSTEMI) myocardial infarction [facility]) 08/22/2013   CAD S/P percutaneous coronary angioplasty 08/22/2013    Saahil Herbster, PT 12/17/2020, 12:20 PM  [place], [place], [address] Phone: [phone]   Fax:  [phone]  Name: Tammy Good MRN: [id number] Date of Birth: 17-May-1950  Raeford Razor, PT 12/17/20 12:20 PM Phone: [phone] Fax: [phone]

## 2020-12-17 NOTE — Patient Instructions (Signed)
Access Code: [email or web address] URL: [email or web address] Date: 12/17/2020 Prepared by: Raeford Razor  Exercises Supine Lower Trunk Rotation - 2 x daily - 7 x weekly - 2 sets - 10 reps - 10 hold Supine Bridge - 1 x daily - 7 x weekly - 2 sets - 10 reps - 5 hold Supine Active Straight Leg Raise - 1 x daily - 7 x weekly - 2 sets - 10 reps - 5 hold Modified Thomas Stretch - 1 x daily - 7 x weekly - 1 sets - 3 reps - 30 hold Sit to Stand Without Arm Support - 2 x daily - 7 x weekly - 2 sets - 10 reps - 5 hold

## 2020-12-20 NOTE — Progress Notes (Signed)
Cardiology Office Note   Date:  12/21/2020   ID:  Tammy Good, DOB [birth year], MRN [id number]  PCP:  [name], MD  Cardiologist: Dr. Ellyn Hack No chief complaint on file.    History of Present Illness: Tammy Good is a 70 y.o. female who presents for ongoing assessment and management of coronary artery disease with history of inferior STEMI with inferior wall motion abnormalities and PCI in 2015, hypertension, who was admitted on 11/15/2020 for critical limb ischemia and right SFA stenting.  She developed postoperative hypotension on 11/16/2020 with a newly reduced ejection fraction and elevated troponin concerning for non-STEMI.  She was scheduled for cardiac catheterization, which was completed on 11/19/2020.  This revealed severe two-vessel coronary artery disease including a 95% proximal left circumflex stenosis and multifocal D1 disease of up to 90%.  She did have a patent RCA stent.  She underwent a successful PCI to the proximal left circumflex using Onyx frontier 2.5 x 12 mm DES with 0% residual stenosis and TIMI-3 flow.  She was noted to have atrial fibrillation during admission.  It was preferable to have Plavix and Eliquis, but discussion was had with vascular surgery concerning avoidance of triple therapy if able.  She was transitioned to metoprolol succinate 25 mg daily and high intensity statin with SGLT2 inhibitor added.  Patient was also noted to have hypothyroidism with iatrogenic hyperthyroidism.  Her Synthroid medication was adjusted as it was being dosed too high at that time.  During hospitalization in the setting of anemia she did have blood transfusions.  Was followed closely by vein and vascular service.  On discharge the patient was scheduled to have physical therapy but did not do well on first attempt due to shortness of breath and lightheadedness.  She was continued on outpatient physical therapy.  She remained on metoprolol Eliquis, Plavix losartan and  Aldactone on discharge.  She comes today feeling well despite her lengthy complicated hospitalization.  She continues to have some soreness in the left groin and into the left leg but is able to bear weight.  She denies any bleeding, palpitations, dyspnea, or chest pain.  She has undergone physical therapy for the right SFA intervention.  She would like to undergo cardiac rehab as well.   Past Medical History:  Diagnosis Date   CAD S/P percutaneous coronary angioplasty 08/2013   100% mRCA - PCI Integrity Resolute DES 3.0 mm x 38 mm - 3.35 mm; PTCA of RPA V 2.0 mm x 15 mm   Cholesteatoma    right   Diabetes mellitus type 2 in obese (HCC)    On insulin and Invokana   History of osteomyelitis L 5th Toe all 05/2019   s/p Partial Ray Amputation with partial closure; 6 wks Abx & LSFA Atherectomy/DEB PTA with Stent for focal dissection.   Hyperlipidemia with target LDL less than 70    Hypothyroidism (acquired)    Mild essential hypertension    Obesity (BMI 30-39.9) 11/17/2013   PAD (peripheral artery disease) ([place]) 05/26/2019   05/26/19: Abd AoGram- BLE runoff -> L SFA orbital atherectomy - PTA w/ DEB & Stent 6 x 40 Luttonix (for focal dissection) - patent Pop A with 3 V runoff. LEA Dopplers 01/03/2020: RABI (prev) 0.68 (0.53)/ RTBI (prev) 0.58 (0.33); LABI (prev) 0.80 (0.64), LTBI (prev) 0.64 (0.51); R mSFA ~50-74%, L mSFA 50-74%. Patent Prox SFA stent < 49% stenosis   Panhypopituitarism (HCC)    ST elevation myocardial infarction (STEMI) of inferior wall, subsequent episode  of care [facility]) 08/2013   80% branch of D1, 40% mid AV groove circumflex, 100% RCA with subacute thrombus -- thrombus extending into RPA V with 100% occlusion after initial angioplasty of mid RCA ;; Post MI ECHO 6/9/'15: EF 50-55%, mild LVH with moderate HK of inferior wall, Gr1 DD, mild LA dilation; mildly reduced RV function    Past Surgical History:  Procedure Laterality Date   ABDOMINAL AORTOGRAM W/LOWER EXTREMITY N/A  05/26/2019   Procedure: ABDOMINAL AORTOGRAM W/LOWER EXTREMITY;  Surgeon: Marty Heck, MD;  Location: [place] CV LAB;  Service: Cardiovascular;  Laterality: N/A;   ABDOMINAL AORTOGRAM W/LOWER EXTREMITY N/A 11/15/2020   Procedure: ABDOMINAL AORTOGRAM W/LOWER EXTREMITY;  Surgeon: Marty Heck, MD;  Location: [place] CV LAB;  Service: Cardiovascular;  Laterality: N/A;   AMPUTATION Left 05/25/2019   Procedure: AMPUTATION RAY 5th;  Surgeon: Trula Slade, DPM;  Location: [place];  Service: Podiatry;  Laterality: Left;   Cardiac Event Monitor  July-August 2015   Sinus rhythm with PVCs   COLONOSCOPY N/A 08/31/2013   Procedure: COLONOSCOPY;  Surgeon: Juanita Craver, MD;  Location: [facility] ENDOSCOPY;  Service: Endoscopy;  Laterality: N/A;   CORONARY STENT INTERVENTION N/A 11/19/2020   Procedure: CORONARY STENT INTERVENTION;  Surgeon: Nelva Bush, MD;  Location: [place] CV LAB;  Service: Cardiovascular;  Laterality: N/A;   ESOPHAGOGASTRODUODENOSCOPY N/A 09/01/2013   Procedure: ESOPHAGOGASTRODUODENOSCOPY (EGD);  Surgeon: Beryle Beams, MD;  Location: [facility] ENDOSCOPY;  Service: Endoscopy;  Laterality: N/A;  bedside   LEFT HEART CATH AND CORONARY ANGIOGRAPHY N/A 11/19/2020   Procedure: LEFT HEART CATH AND CORONARY ANGIOGRAPHY;  Surgeon: Nelva Bush, MD;  Location: [place] CV LAB;  Service: Cardiovascular;  Laterality: N/A;   LEFT HEART CATHETERIZATION WITH CORONARY ANGIOGRAM N/A 08/30/2013   Procedure: LEFT HEART CATHETERIZATION WITH CORONARY ANGIOGRAM;  Surgeon: Leonie Man, MD;  Location: [facility] CATH LAB: 100% mRCA (thrombus - extends to RPAV), 80% D1, 40% AVG Cx.   PERCUTANEOUS CORONARY STENT INTERVENTION (PCI-S)  08/30/2013   Procedure: PERCUTANEOUS CORONARY STENT INTERVENTION (PCI-S);  Surgeon: Leonie Man, MD;  Location: [place] CATH LAB;  Integrity Resolute DES 2.0 mm x 38 mm -- 3.35 mm.; PTCA of proximal RPA V. - 3.0 mm x 15 mm balloon   PERIPHERAL VASCULAR ATHERECTOMY   05/26/2019   Procedure: PERIPHERAL VASCULAR ATHERECTOMY;  Surgeon: Marty Heck, MD;  Location: [place] CV LAB;  Service: Cardiovascular;;  Left SFA   PERIPHERAL VASCULAR INTERVENTION  05/26/2019   Procedure: PERIPHERAL VASCULAR INTERVENTION;  Surgeon: Marty Heck, MD;  Location: [place] CV LAB;  Service: Cardiovascular;;  Left SFA   PERIPHERAL VASCULAR INTERVENTION Right 11/15/2020   Procedure: PERIPHERAL VASCULAR INTERVENTION;  Surgeon: Marty Heck, MD;  Location: [place] CV LAB;  Service: Cardiovascular;  Laterality: Right;  Superficial Femoral Artery   PITUITARY SURGERY     TRANSTHORACIC ECHOCARDIOGRAM  08/30/2013   mild LVH. EF 50-55%. Moderate HK of the entire inferior myocardium. GR 1 DD. Mild LA dilation. Mildly reduced RV function   TYMPANOMASTOIDECTOMY Right 12/28/2017   Procedure: RIGHT TYMPANOMASTOIDECTOMY;  Surgeon: Leta Baptist, MD;  Location: [place];  Service: ENT;  Laterality: Right;     Current Outpatient Medications  Medication Sig Dispense Refill   apixaban (ELIQUIS) 5 MG TABS tablet Take 1 tablet (5 mg total) by mouth 2 (two) times daily. 60 tablet 6   atorvastatin (LIPITOR) 40 MG tablet Take 1 tablet (40 mg total) by mouth daily  at 6 PM. 30 tablet 0   B-D UF III MINI PEN NEEDLES 31G X 5 MM MISC Inject into the skin.     clopidogrel (PLAVIX) 75 MG tablet Take 1 tablet by mouth once daily with breakfast 90 tablet 0   Continuous Blood Gluc Sensor (FREESTYLE LIBRE 2 SENSOR) MISC Apply topically every 14 (fourteen) days.     dexamethasone (DECADRON) 0.5 MG tablet Take 0.5 mg by mouth daily.     FARXIGA 10 MG TABS tablet Take 10 mg by mouth daily.      ferrous sulfate 325 (65 FE) MG tablet Take 325 mg by mouth daily with breakfast.     Insulin Glargine (BASAGLAR KWIKPEN) 100 UNIT/ML SOPN Inject 48 Units into the skin at bedtime.     levothyroxine (SYNTHROID) 125 MCG tablet Take 1 tablet (125 mcg total) by mouth daily before  breakfast. 30 tablet 2   losartan (COZAAR) 25 MG tablet Take 1 tablet (25 mg total) by mouth daily. 30 tablet 11   metoprolol succinate (TOPROL-XL) 25 MG 24 hr tablet Take 1 tablet (25 mg total) by mouth in the morning and at bedtime. 60 tablet 0   nitroGLYCERIN (NITROSTAT) 0.4 MG SL tablet Place 1 tablet (0.4 mg total) under the tongue every 5 (five) minutes as needed for chest pain (up to 3 doses. If taking 3rd dose call 911). 25 tablet 3   NOVOLOG FLEXPEN 100 UNIT/ML FlexPen Inject 5-15 Units into the skin 3 (three) times daily with meals. Per sliding scale     nystatin cream (MYCOSTATIN) Apply 1 application topically 2 (two) times daily. (Patient taking differently: Apply 1 application topically 2 (two) times daily as needed (rash).) 30 g 1   Omega-3 Fatty Acids (FISH OIL) 1000 MG CAPS Take 1,000 mg by mouth daily.     ONETOUCH VERIO test strip   1   pantoprazole (PROTONIX) 40 MG tablet Take 1 tablet (40 mg total) by mouth 2 (two) times daily before a meal. 60 tablet 0   Propylene Glycol (SYSTANE COMPLETE) 0.6 % SOLN Place 1 drop into both eyes daily as needed (dry eyes).     silver sulfADIAZINE (SILVADENE) 1 % cream Apply 1 application topically daily. Apply thin layer to leg wound daily 50 g 0   spironolactone (ALDACTONE) 25 MG tablet Take 1 tablet (25 mg total) by mouth daily. 30 tablet 11   No current facility-administered medications for this visit.    Allergies:   Strawberry extract    Social History:  The patient  reports that she has quit smoking. She has never used smokeless tobacco. She reports that she does not drink alcohol and does not use drugs.   Family History:  The patient's family history includes Alzheimer's disease in her maternal grandmother; Cancer in her sister; Cancer (age of onset: 40) in her mother; Heart attack (age of onset: 31) in her father.    ROS: All other systems are reviewed and negative. Unless otherwise mentioned in H&P    PHYSICAL EXAM: VS:  BP  108/60 (BP Location: Right Arm, Patient Position: Sitting, Cuff Size: Large)   Pulse 78   Ht 5\' 7"  (1.702 m)   Wt 228 lb (103.4 kg)   SpO2 97%   BMI 35.71 kg/m  , BMI Body mass index is 35.71 kg/m. GEN: Well nourished, well developed, in no acute distress HEENT: normal Neck: no JVD, carotid bruits, or masses Cardiac: RRR; no murmurs, rubs, or gallops,no edema pulses are palpable Respiratory:  Clear to auscultation bilaterally, normal work of breathing GI: soft, nontender, nondistended, + BS MS: no deformity or atrophy, I have examined left groin there is no hematoma, or bruit. Skin: warm and dry, no rash Neuro:  Strength and sensation are intact Psych: euthymic mood, full affect   EKG: (Personally reviewed) normal sinus rhythm with inferior posterior infarct noted with anterior lateral infarct age undetermined, inverted R waves V3 for 5 and 6.  T wave inversion lead III.  Heart rate of 78 bpm.  Recent Labs: 11/16/2020: B Natriuretic Peptide 217.2; Magnesium 2.0; TSH <0.010 11/23/2020: ALT 16; Hemoglobin 12.1; Platelets 511 11/27/2020: BUN 15; Creatinine, Ser 0.95; Potassium 4.0; Sodium 136    Lipid Panel    Component Value Date/Time   CHOL 87 11/19/2020 0152   TRIG 85 11/19/2020 0152   HDL 24 (L) 11/19/2020 0152   CHOLHDL 3.6 11/19/2020 0152   VLDL 17 11/19/2020 0152   LDLCALC 46 11/19/2020 0152      Wt Readings from Last 3 Encounters:  12/21/20 228 lb (103.4 kg)  11/27/20 227 lb 8 oz (103.2 kg)  11/23/20 230 lb (104.3 kg)      Other studies Reviewed: Left Heart Cath 11/19/2020.  [place], mass: Conclusions: Severe two-vessel coronary artery disease including 95% proximal LCx stenosis and multifocal D1 disease of up to 90%. Mild to moderate LAD and RCA disease. Patent RCA stent. Mildly elevated left ventricular filling pressure (LVEDP ~20 mmHg). Challenging but successful PCI to proximal LCx using Onyx Frontier 2.5 x 12 mm DES with 0% residual stenosis and TIMI-3 flow.    Recommendations: Restart IV heparin 2 hours after TR band removal. If no evidence of bleeding or vascular injury, consider transitioning to clopidogrel + DOAC as soon as tomorrow at the discretion of the rounding cardiology team given h/o PAF this admission. Aggressive secondary prevention. Medical management of diagonal disease, as the diseased portions of the vessel are too small for PCI.   Nelva Bush, MD [facility]  ASSESSMENT AND PLAN:  1.  Peripheral arterial disease: Status post right SFA stenting resulting in large hematoma and blood loss.  Requiring transfusions.  Patient subsequently had non-STEMI.  On Plavix.  Resolution of hematoma on examination today.  2.  Coronary artery disease: Status post cardiac catheterization 11/19/2020 revealing critical circumflex stenosis with multifocal diagonal 1 disease up to 90%, with patent prior RCA stent.  PCI to the left circumflex using frontier 2.5 x 12 mm drug-eluting stent.  She is now on Plavix 75 mg daily but not on aspirin in the setting of anticoagulation therapy.  3.  Paroxysmal atrial fibrillation: Currently in normal sinus rhythm today but remains on metoprolol for rate control along with Eliquis 5 mg twice daily.  She will have close follow-up in 3 months to evaluate need to continue anticoagulation.  She does not have any significant of bleeding bruising, hematoma to left groin well-healed without bruit.  4.  Hypertension: Blood pressure currently well controlled I have given her refills on spironolactone 25 mg daily as she had been on prior to hospitalization.  I have asked her to keep up with her blood pressure to evaluate for hypotension at which time we may need to decrease spironolactone to 12.5 mg daily.  I am following up with a CBC and a BMET today.  5.  Hypercholesterolemia: Currently on Zetia 10 mg daily.  Goal of LDL less than 70.  LDL 46 on recent admission.  She is not currently on statin therapy.  6.   Hypothyroidism: Remains on Synthroid 125 mcg daily.  Followed by PCP.  Current medicines are reviewed at length with the patient today.  I have spent 25 minutes  dedicated to the care of this patient on the date of this encounter to include pre-visit review of records, assessment, management and diagnostic testing,with shared decision making.  Labs/ tests ordered today include: BMET. CBC Phill Myron. [place] Pugh, ANP, AACC   12/21/2020 4:02 PM    [facility] [place] [address] Office [phone] Fax [phone]  Notice: This dictation was prepared with Dragon dictation along with smaller phrase technology. Any transcriptional errors that result from this process are unintentional and may not be corrected upon review.

## 2020-12-21 ENCOUNTER — Encounter: Payer: Self-pay | Admitting: Adult Health

## 2020-12-21 ENCOUNTER — Ambulatory Visit (INDEPENDENT_AMBULATORY_CARE_PROVIDER_SITE_OTHER): Payer: Medicare Other | Admitting: Adult Health

## 2020-12-21 ENCOUNTER — Other Ambulatory Visit: Payer: Self-pay

## 2020-12-21 VITALS — BP 108/60 | HR 78 | Ht 67.0 in | Wt 228.0 lb

## 2020-12-21 DIAGNOSIS — Z9861 Coronary angioplasty status: Secondary | ICD-10-CM | POA: Diagnosis not present

## 2020-12-21 DIAGNOSIS — I10 Essential (primary) hypertension: Secondary | ICD-10-CM | POA: Diagnosis not present

## 2020-12-21 DIAGNOSIS — I2510 Atherosclerotic heart disease of native coronary artery without angina pectoris: Secondary | ICD-10-CM | POA: Diagnosis not present

## 2020-12-21 DIAGNOSIS — Z79899 Other long term (current) drug therapy: Secondary | ICD-10-CM

## 2020-12-21 DIAGNOSIS — I739 Peripheral vascular disease, unspecified: Secondary | ICD-10-CM

## 2020-12-21 DIAGNOSIS — I480 Paroxysmal atrial fibrillation: Secondary | ICD-10-CM

## 2020-12-21 DIAGNOSIS — E039 Hypothyroidism, unspecified: Secondary | ICD-10-CM

## 2020-12-21 MED ORDER — SPIRONOLACTONE 25 MG PO TABS: 25.0000 mg | ORAL_TABLET | Freq: Every day | ORAL | 11 refills | Status: DC

## 2020-12-21 NOTE — Telephone Encounter (Signed)
Called and spoke with pt in regards to CR, pt stated she is getting PT for her back and hip. She is not able to stand/walk far.   Closed referral

## 2020-12-21 NOTE — Patient Instructions (Signed)
Medication Instructions:  The current medical regimen is effective;  continue present plan and medications as directed. Please refer to the Current Medication list given to you today.  *If you need a refill on your cardiac medications before your next appointment, please call your pharmacy*  Lab Work: BMET AND CBC TODAY If you have labs (blood work) drawn today and your tests are completely normal, you will receive your results only by:  [place] (if you have MyChart) OR A paper copy in the mail.  If you have any lab test that is abnormal or we need to change your treatment, we will call you to review the results. You may go to any [facility] that is convenient for you however, we do have a lab in our office that is able to assist you. You DO NOT need an appointment for our lab. The lab is open 8:00am and closes at 4:00pm. Lunch 12:45 - 1:45pm.  Follow-Up: Your next appointment:  6 week(s) In Person with Glenetta Hew, MD or Jory Sims, DNP, ANP   At [facility], you and your health needs are our priority.  As part of our continuing mission to provide you with exceptional heart care, we have created designated Provider Care Teams.  These Care Teams include your primary Cardiologist (physician) and Advanced Practice Providers (APPs -  Physician Assistants and Nurse Practitioners) who all work together to provide you with the care you need, when you need it.

## 2020-12-22 LAB — CBC
Hematocrit: 36.9 % (ref 34.0–46.6)
Hemoglobin: 12 g/dL (ref 11.1–15.9)
MCH: 28.4 pg (ref 26.6–33.0)
MCHC: 32.5 g/dL (ref 31.5–35.7)
MCV: 87 fL (ref 79–97)
Platelets: 381 10*3/uL (ref 150–450)
RBC: 4.23 x10E6/uL (ref 3.77–5.28)
RDW: 14.8 % (ref 11.7–15.4)
WBC: 9.5 10*3/uL (ref 3.4–10.8)

## 2020-12-22 LAB — BASIC METABOLIC PANEL
BUN/Creatinine Ratio: 22 (ref 12–28)
BUN: 18 mg/dL (ref 8–27)
CO2: 26 mmol/L (ref 20–29)
Calcium: 9.6 mg/dL (ref 8.7–10.3)
Chloride: 100 mmol/L (ref 96–106)
Creatinine, Ser: 0.81 mg/dL (ref 0.57–1.00)
Glucose: 155 mg/dL — ABNORMAL HIGH (ref 70–99)
Potassium: 4.6 mmol/L (ref 3.5–5.2)
Sodium: 141 mmol/L (ref 134–144)
eGFR: 79 mL/min/{1.73_m2} (ref >59)

## 2020-12-24 ENCOUNTER — Ambulatory Visit: Payer: Medicare Other | Attending: Endocrinology | Admitting: Physical Therapy

## 2020-12-24 ENCOUNTER — Other Ambulatory Visit: Payer: Self-pay

## 2020-12-24 DIAGNOSIS — G8929 Other chronic pain: Secondary | ICD-10-CM | POA: Insufficient documentation

## 2020-12-24 DIAGNOSIS — M5440 Lumbago with sciatica, unspecified side: Secondary | ICD-10-CM | POA: Insufficient documentation

## 2020-12-24 DIAGNOSIS — M6281 Muscle weakness (generalized): Secondary | ICD-10-CM | POA: Diagnosis not present

## 2020-12-24 DIAGNOSIS — R600 Localized edema: Secondary | ICD-10-CM | POA: Insufficient documentation

## 2020-12-24 DIAGNOSIS — R29898 Other symptoms and signs involving the musculoskeletal system: Secondary | ICD-10-CM | POA: Diagnosis present

## 2020-12-24 NOTE — Therapy (Signed)
[place], [place], [address] Phone: [phone]   Fax:  [phone]  Physical Therapy Treatment  Patient Details  Name: Tammy Good MRN: [id number] Date of Birth: February 07, 1951 Referring Provider (PT): Dr. Monica Martinez (Dr Sharol Given for wound)   Encounter Date: 12/24/2020   PT End of Session - 12/24/20 0936     Visit Number 2    Number of Visits 8    Date for PT Re-Evaluation 02/11/21    Authorization Type MCR, BCBS    PT Start Time [phone]    PT Stop Time 1015    PT Time Calculation (min) 41 min             Past Medical History:  Diagnosis Date   CAD S/P percutaneous coronary angioplasty 08/2013   100% mRCA - PCI Integrity Resolute DES 3.0 mm x 38 mm - 3.35 mm; PTCA of RPA V 2.0 mm x 15 mm   Cholesteatoma    right   Diabetes mellitus type 2 in obese (HCC)    On insulin and Invokana   History of osteomyelitis L 5th Toe all 05/2019   s/p Partial Ray Amputation with partial closure; 6 wks Abx & LSFA Atherectomy/DEB PTA with Stent for focal dissection.   Hyperlipidemia with target LDL less than 70    Hypothyroidism (acquired)    Mild essential hypertension    Obesity (BMI 30-39.9) 11/17/2013   PAD (peripheral artery disease) ([place]) 05/26/2019   05/26/19: Abd AoGram- BLE runoff -> L SFA orbital atherectomy - PTA w/ DEB & Stent 6 x 40 Luttonix (for focal dissection) - patent Pop A with 3 V runoff. LEA Dopplers 01/03/2020: RABI (prev) 0.68 (0.53)/ RTBI (prev) 0.58 (0.33); LABI (prev) 0.80 (0.64), LTBI (prev) 0.64 (0.51); R mSFA ~50-74%, L mSFA 50-74%. Patent Prox SFA stent < 49% stenosis   Panhypopituitarism (HCC)    ST elevation myocardial infarction (STEMI) of inferior wall, subsequent episode of care ([place]) 08/2013   80% branch of D1, 40% mid AV groove circumflex, 100% RCA with subacute thrombus -- thrombus extending into RPA V with 100% occlusion after initial angioplasty of mid RCA ;; Post MI ECHO  6/9/'15: EF 50-55%, mild LVH with moderate HK of inferior wall, Gr1 DD, mild LA dilation; mildly reduced RV function    Past Surgical History:  Procedure Laterality Date   ABDOMINAL AORTOGRAM W/LOWER EXTREMITY N/A 05/26/2019   Procedure: ABDOMINAL AORTOGRAM W/LOWER EXTREMITY;  Surgeon: Marty Heck, MD;  Location: [place] [facility];  Service: Cardiovascular;  Laterality: N/A;   ABDOMINAL AORTOGRAM W/LOWER EXTREMITY N/A 11/15/2020   Procedure: ABDOMINAL AORTOGRAM W/LOWER EXTREMITY;  Surgeon: Marty Heck, MD;  Location: [place] [facility];  Service: Cardiovascular;  Laterality: N/A;   AMPUTATION Left 05/25/2019   Procedure: AMPUTATION RAY 5th;  Surgeon: Trula Slade, DPM;  Location: [place];  Service: Podiatry;  Laterality: Left;   Cardiac Event Monitor  July-August 2015   Sinus rhythm with PVCs   COLONOSCOPY N/A 08/31/2013   Procedure: COLONOSCOPY;  Surgeon: Juanita Craver, MD;  Location: [facility] ENDOSCOPY;  Service: Endoscopy;  Laterality: N/A;   CORONARY STENT INTERVENTION N/A 11/19/2020   Procedure: CORONARY STENT INTERVENTION;  Surgeon: Nelva Bush, MD;  Location: [place] [facility];  Service: Cardiovascular;  Laterality: N/A;   ESOPHAGOGASTRODUODENOSCOPY N/A 09/01/2013   Procedure: ESOPHAGOGASTRODUODENOSCOPY (EGD);  Surgeon: Beryle Beams, MD;  Location: [facility] ENDOSCOPY;  Service: Endoscopy;  Laterality: N/A;  bedside   LEFT HEART  CATH AND CORONARY ANGIOGRAPHY N/A 11/19/2020   Procedure: LEFT HEART CATH AND CORONARY ANGIOGRAPHY;  Surgeon: Nelva Bush, MD;  Location: [place] [facility];  Service: Cardiovascular;  Laterality: N/A;   LEFT HEART CATHETERIZATION WITH CORONARY ANGIOGRAM N/A 08/30/2013   Procedure: LEFT HEART CATHETERIZATION WITH CORONARY ANGIOGRAM;  Surgeon: Leonie Man, MD;  Location: [facility] CATH LAB: 100% mRCA (thrombus - extends to RPAV), 80% D1, 40% AVG Cx.   PERCUTANEOUS CORONARY STENT INTERVENTION (PCI-S)  08/30/2013   Procedure: PERCUTANEOUS CORONARY  STENT INTERVENTION (PCI-S);  Surgeon: Leonie Man, MD;  Location: [facility] CATH LAB;  Integrity Resolute DES 2.0 mm x 38 mm -- 3.35 mm.; PTCA of proximal RPA V. - 3.0 mm x 15 mm balloon   PERIPHERAL VASCULAR ATHERECTOMY  05/26/2019   Procedure: PERIPHERAL VASCULAR ATHERECTOMY;  Surgeon: Marty Heck, MD;  Location: [place] [facility];  Service: Cardiovascular;;  Left SFA   PERIPHERAL VASCULAR INTERVENTION  05/26/2019   Procedure: PERIPHERAL VASCULAR INTERVENTION;  Surgeon: Marty Heck, MD;  Location: [place] [facility];  Service: Cardiovascular;;  Left SFA   PERIPHERAL VASCULAR INTERVENTION Right 11/15/2020   Procedure: PERIPHERAL VASCULAR INTERVENTION;  Surgeon: Marty Heck, MD;  Location: [place] [facility];  Service: Cardiovascular;  Laterality: Right;  Superficial Femoral Artery   PITUITARY SURGERY     TRANSTHORACIC ECHOCARDIOGRAM  08/30/2013   mild LVH. EF 50-55%. Moderate HK of the entire inferior myocardium. GR 1 DD. Mild LA dilation. Mildly reduced RV function   TYMPANOMASTOIDECTOMY Right 12/28/2017   Procedure: RIGHT TYMPANOMASTOIDECTOMY;  Surgeon: Leta Baptist, MD;  Location: [place];  Service: ENT;  Laterality: Right;    There were no vitals filed for this visit.   Subjective Assessment - 12/24/20 0939     Subjective I feel like I lean to the Rt when I walk.  THe exercises are making me feel better.    Currently in Pain? No/denies                   Skilled therapy interventions:   Therapeutic Exercise:  NuStep L6 UE and LE for 5 min  Sit to stand x 10 no UEs Standing mini squat x 10 , min knee pain  Toe raises x 15  Supine   Lower trunk rotation x 10 x 2  , cues   Bridging x 10 x 2 ( 1 set with blue band)   March blue band x 10   SLR x 10 x 2   Standing gait , posture assessment   Sidelying Rt trunk stretch arm overhead with manual pressure , brief   Self care/Pt. Education:   HEP for trunk stretching                         PT Short Term Goals - 12/17/20 1202       PT SHORT TERM GOAL #1   Title Pt will be I with HEP for general strength and conditioning    Time 4    Period Weeks    Status New    Target Date 01/14/21      PT SHORT TERM GOAL #2   Title Pt will report less Rt hip and back pain with standing tasks in her home, avg 5/10    Time 4    Period Weeks    Status New    Target Date 01/14/21      PT SHORT TERM GOAL #  3   Title Pt will be able to walk > 250 feet for 2 min walk test due to improved endurance    Baseline 220    Time 4    Period Weeks    Status New    Target Date 01/14/21               PT Long Term Goals - 12/17/20 1204       PT LONG TERM GOAL #1   Title pt will be I with HEP upon discharge for more comprehensive program for back and hip    Time 8    Period Weeks    Status New    Target Date 02/11/21      PT LONG TERM GOAL #2   Title Pt will be able to stand without UEs x 5 in < 30 sec    Time 8    Period Weeks    Status New    Target Date 02/11/21      PT LONG TERM GOAL #3   Title Pt will be able to walk in the community with improved gait, limp for 500 feet and no device.    Time 8    Period Weeks    Status New    Target Date 02/11/21      PT LONG TERM GOAL #4   Title Pt will be able to get on and off the mat table without increased pain    Time 8    Period Weeks    Status New    Target Date 02/11/21                   Plan - 12/24/20 0951     Clinical Impression Statement Cert signed by [name].  She is not having any pain, tolerates exercises well.  She was able to lift her Rt leg better than on evaluation.  Will cont to see her for general mobility and strengthening.    PT Treatment/Interventions ADLs/Self Care Home Management;Gait training;Therapeutic exercise;Patient/family education;Manual techniques;Passive range of motion;Balance training;Moist Heat;Therapeutic activities;Functional  mobility training;Cryotherapy;Electrical Stimulation    PT Next Visit Plan gait, balance challenges, Rt trunk stretch    PT Home Exercise Plan Access Code: [email or web address]    Consulted and Agree with Plan of Care Patient             Patient will benefit from skilled therapeutic intervention in order to improve the following deficits and impairments:  Cardiopulmonary status limiting activity, Decreased activity tolerance, Decreased strength, Increased fascial restricitons, Impaired flexibility, Pain, Postural dysfunction, Obesity, Increased edema, Decreased range of motion, Decreased endurance, Decreased mobility, Difficulty walking, Abnormal gait  Visit Diagnosis: Muscle weakness (generalized)  Other symptoms and signs involving the musculoskeletal system  Chronic left-sided low back pain with sciatica, sciatica laterality unspecified  Localized edema     Problem List Patient Active Problem List   Diagnosis Date Noted   Fatty liver 06/19/2020   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 ([place]) 17/40/8144   Atherosclerotic heart disease of native coronary artery without angina pectoris 03/20/2020   Epigastric pain 03/20/2020   Nausea and vomiting 03/20/2020   Absence of toe ([place]) 06/28/2019   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   PAD (peripheral artery disease) ([place]) 05/26/2019   Osteomyelitis of left foot ([place]) 05/24/2019   Osteomyelitis ([place]) 05/23/2019   Cellulitis and abscess of foot, except toes 05/18/2019   Chronic osteomyelitis of ankle and foot ([place]) 05/18/2019  Cholesteatoma 02/24/2019   Cellulitis and abscess of toe 11/09/2018   Colon cancer screening 11/30/2017   Long term (current) use of insulin ([place]) 03/05/2017   Iron deficiency anemia 12/10/2014   Obesity (BMI 30-39.9) 11/17/2013   Diabetes mellitus type 2 in obese [facility])    Essential hypertension    Right thigh pain 11/01/2013   PVC's (premature ventricular contractions) 11/01/2013    Status post insertion of drug eluting coronary artery stent to [facility] emergently and PTCA to prox. PLA 09/16/2013   Hyperlipidemia associated with type 2 diabetes mellitus ([place]) 09/16/2013   Presence of coronary angioplasty implant and graft 09/08/2013   Panhypopituitarism ([place]) 08/30/2013   Non-ST elevation (NSTEMI) myocardial infarction [facility]) 08/22/2013   CAD S/P percutaneous coronary angioplasty 08/22/2013    Zayon Trulson, PT 12/24/2020, 10:22 AM  [place], [place], [address] Phone: [phone]   Fax:  [phone]  Name: Tammy Good MRN: [id number] Date of Birth: 06-Sep-1950  Raeford Razor, PT 12/24/20 10:22 AM Phone: [phone] Fax: [phone]

## 2020-12-25 ENCOUNTER — Other Ambulatory Visit (HOSPITAL_COMMUNITY): Payer: Self-pay | Admitting: Cardiology

## 2020-12-31 ENCOUNTER — Encounter: Payer: Self-pay | Admitting: Orthopedic Surgery

## 2020-12-31 ENCOUNTER — Ambulatory Visit (INDEPENDENT_AMBULATORY_CARE_PROVIDER_SITE_OTHER): Payer: Medicare Other | Admitting: Orthopedic Surgery

## 2020-12-31 ENCOUNTER — Other Ambulatory Visit: Payer: Self-pay

## 2020-12-31 DIAGNOSIS — Z9861 Coronary angioplasty status: Secondary | ICD-10-CM | POA: Diagnosis not present

## 2020-12-31 DIAGNOSIS — S81801A Unspecified open wound, right lower leg, initial encounter: Secondary | ICD-10-CM

## 2020-12-31 DIAGNOSIS — I2510 Atherosclerotic heart disease of native coronary artery without angina pectoris: Secondary | ICD-10-CM

## 2020-12-31 NOTE — Progress Notes (Signed)
Office Visit Note   Patient: Tammy Good           Date of Birth: [birth year]           MRN: [id number] Visit Date: 12/31/2020              Requested by: [name], MD [address] [place],  [place] [address] PCP: [name], MD  Chief Complaint  Patient presents with   Right Leg - Follow-up      HPI: Patient is a 70 year old woman who presents in follow-up for traumatic ulcer right leg.  She has undergone revascularization with [name] and states the ulcer has completely healed.  Assessment & Plan: Visit Diagnoses:  1. Traumatic open wound of right lower leg, initial encounter     Plan: Patient was given a prescription for a size medium compression stocking to wear all the time to help decrease the swelling in the foot and ankle.  Increase her activities as tolerated.  Follow-Up Instructions: Return if symptoms worsen or fail to improve.   Ortho Exam  Patient is alert, oriented, no adenopathy, well-dressed, normal affect, normal respiratory effort. Examination the ulcer lateral aspect of the right leg has completely healed there is no redness no cellulitis.  Her calf measures 32 cm in circumference.  Imaging: No results found. No images are attached to the encounter.  Labs: Lab Results  Component Value Date   HGBA1C 6.7 (H) 11/15/2020   HGBA1C 6.9 (H) 09/02/2013   ESRSEDRATE 52 (H) 08/30/2013   REPTSTATUS 05/30/2019 FINAL 05/25/2019   GRAMSTAIN NO WBC SEEN NO ORGANISMS SEEN  05/25/2019   CULT  05/25/2019    RARE METHICILLIN RESISTANT STAPHYLOCOCCUS AUREUS RESULT CALLED TO, READ BACK BY AND VERIFIED WITH: RN J BERIS [id number] MLM NO ANAEROBES ISOLATED Performed at [facility], [place] [address], [place], [place] [address]    LABORGA METHICILLIN RESISTANT STAPHYLOCOCCUS AUREUS 05/25/2019     Lab Results  Component Value Date   ALBUMIN 3.0 (L) 11/23/2020   ALBUMIN 2.4 (L) 11/20/2020   ALBUMIN 2.8 (L) 05/23/2019    Lab Results   Component Value Date   MG 2.0 11/16/2020   MG 1.6 (L) 05/30/2019   MG 1.6 (L) 05/27/2019   No results found for: VD25OH  No results found for: PREALBUMIN CBC EXTENDED Latest Ref Rng & Units 12/21/2020 11/23/2020 11/22/2020  WBC 3.4 - 10.8 x10E3/uL 9.5 12.5(H) 11.0(H)  RBC 3.77 - 5.28 x10E6/uL 4.23 4.28 3.77(L)  HGB 11.1 - 15.9 g/dL 12.0 12.1 10.8(L)  HCT 34.0 - 46.6 % 36.9 40.2 34.7(L)  PLT 150 - 450 x10E3/uL 381 511(H) 360  NEUTROABS 1.7 - 7.7 K/uL - 10.5(H) -  LYMPHSABS 0.7 - 4.0 K/uL - 1.1 -     There is no height or weight on file to calculate BMI.  Orders:  No orders of the defined types were placed in this encounter.  No orders of the defined types were placed in this encounter.    Procedures: No procedures performed  Clinical Data: No additional findings.  ROS:  All other systems negative, except as noted in the HPI. Review of Systems  Objective: Vital Signs: There were no vitals taken for this visit.  Specialty Comments:  No specialty comments available.  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   PAD (peripheral artery disease) ([place]) 05/26/2019   Osteomyelitis of left foot ([place]) 05/24/2019   Osteomyelitis ([place]) 05/23/2019   Cellulitis and abscess of foot, except toes 05/18/2019   Chronic osteomyelitis of ankle and foot ([place]) 05/18/2019   Cholesteatoma 02/24/2019   Cellulitis and abscess of toe 11/09/2018   Colon cancer screening 11/30/2017   Long term (current) use of insulin ([place]) 03/05/2017   Iron deficiency anemia 12/10/2014   Obesity (BMI 30-39.9) 11/17/2013   Diabetes mellitus  type 2 in obese [facility])    Essential hypertension    Right thigh pain 11/01/2013   PVC's (premature ventricular contractions) 11/01/2013   Status post insertion of drug eluting coronary artery stent to [facility] emergently and PTCA to prox. PLA 09/16/2013   Hyperlipidemia associated with type 2 diabetes mellitus ([place]) 09/16/2013   Presence of coronary angioplasty implant and graft 09/08/2013   Panhypopituitarism ([place]) 08/30/2013   Non-ST elevation (NSTEMI) myocardial infarction ([place]) 08/22/2013   CAD S/P percutaneous coronary angioplasty 08/22/2013   Past Medical History:  Diagnosis Date   CAD S/P percutaneous coronary angioplasty 08/2013   100% mRCA - PCI Integrity Resolute DES 3.0 mm x 38 mm - 3.35 mm; PTCA of RPA V 2.0 mm x 15 mm   Cholesteatoma    right   Diabetes mellitus type 2 in obese (HCC)    On insulin and Invokana   History of osteomyelitis L 5th Toe all 05/2019   s/p Partial Ray Amputation with partial closure; 6 wks Abx & LSFA Atherectomy/DEB PTA with Stent for focal dissection.   Hyperlipidemia with target LDL less than 70    Hypothyroidism (acquired)    Mild essential hypertension    Obesity (BMI 30-39.9) 11/17/2013   PAD (peripheral artery disease) ([place]) 05/26/2019   05/26/19: Abd AoGram- BLE runoff -> L SFA orbital atherectomy - PTA w/ DEB & Stent 6 x 40 Luttonix (for focal dissection) - patent Pop A with 3 V runoff. LEA Dopplers 01/03/2020: RABI (prev) 0.68 (0.53)/ RTBI (prev) 0.58 (0.33); LABI (prev) 0.80 (0.64), LTBI (prev) 0.64 (0.51); R mSFA ~50-74%, L mSFA 50-74%. Patent Prox SFA stent < 49% stenosis   Panhypopituitarism (HCC)    ST elevation myocardial infarction (STEMI) of inferior wall, subsequent episode of care ([place]) 08/2013   80% branch of D1, 40% mid AV groove circumflex, 100% RCA with subacute thrombus -- thrombus extending into RPA V with 100% occlusion after initial angioplasty of mid RCA ;; Post MI ECHO 6/9/'15: EF 50-55%, mild LVH with moderate HK of inferior wall,  Gr1 DD, mild LA dilation; mildly reduced RV function    Family History  Problem Relation Age of Onset   Cancer Mother 82       multiple myeloma   Heart attack Father 58   Cancer Sister    Alzheimer's disease Maternal Grandmother     Past Surgical History:  Procedure Laterality Date   ABDOMINAL AORTOGRAM W/LOWER EXTREMITY N/A 05/26/2019   Procedure: ABDOMINAL AORTOGRAM W/LOWER EXTREMITY;  Surgeon: Marty Heck, MD;  Location: [place] [facility];  Service: Cardiovascular;  Laterality: N/A;   ABDOMINAL AORTOGRAM W/LOWER EXTREMITY N/A 11/15/2020   Procedure: ABDOMINAL AORTOGRAM W/LOWER EXTREMITY;  Surgeon: Marty Heck, MD;  Location: [place] [facility];  Service: Cardiovascular;  Laterality: N/A;   AMPUTATION Left  05/25/2019   Procedure: AMPUTATION RAY 5th;  Surgeon: Trula Slade, DPM;  Location: [place];  Service: Podiatry;  Laterality: Left;   Cardiac Event Monitor  July-August 2015   Sinus rhythm with PVCs   COLONOSCOPY N/A 08/31/2013   Procedure: COLONOSCOPY;  Surgeon: Juanita Craver, MD;  Location: [facility] ENDOSCOPY;  Service: Endoscopy;  Laterality: N/A;   CORONARY STENT INTERVENTION N/A 11/19/2020   Procedure: CORONARY STENT INTERVENTION;  Surgeon: Nelva Bush, MD;  Location: [place] [facility];  Service: Cardiovascular;  Laterality: N/A;   ESOPHAGOGASTRODUODENOSCOPY N/A 09/01/2013   Procedure: ESOPHAGOGASTRODUODENOSCOPY (EGD);  Surgeon: Beryle Beams, MD;  Location: [facility] ENDOSCOPY;  Service: Endoscopy;  Laterality: N/A;  bedside   LEFT HEART CATH AND CORONARY ANGIOGRAPHY N/A 11/19/2020   Procedure: LEFT HEART CATH AND CORONARY ANGIOGRAPHY;  Surgeon: Nelva Bush, MD;  Location: [place] [facility];  Service: Cardiovascular;  Laterality: N/A;   LEFT HEART CATHETERIZATION WITH CORONARY ANGIOGRAM N/A 08/30/2013   Procedure: LEFT HEART CATHETERIZATION WITH CORONARY ANGIOGRAM;  Surgeon: Leonie Man, MD;  Location: [facility] CATH LAB: 100% mRCA (thrombus - extends to RPAV),  80% D1, 40% AVG Cx.   PERCUTANEOUS CORONARY STENT INTERVENTION (PCI-S)  08/30/2013   Procedure: PERCUTANEOUS CORONARY STENT INTERVENTION (PCI-S);  Surgeon: Leonie Man, MD;  Location: [facility] CATH LAB;  Integrity Resolute DES 2.0 mm x 38 mm -- 3.35 mm.; PTCA of proximal RPA V. - 3.0 mm x 15 mm balloon   PERIPHERAL VASCULAR ATHERECTOMY  05/26/2019   Procedure: PERIPHERAL VASCULAR ATHERECTOMY;  Surgeon: Marty Heck, MD;  Location: [place] [facility];  Service: Cardiovascular;;  Left SFA   PERIPHERAL VASCULAR INTERVENTION  05/26/2019   Procedure: PERIPHERAL VASCULAR INTERVENTION;  Surgeon: Marty Heck, MD;  Location: [place] [facility];  Service: Cardiovascular;;  Left SFA   PERIPHERAL VASCULAR INTERVENTION Right 11/15/2020   Procedure: PERIPHERAL VASCULAR INTERVENTION;  Surgeon: Marty Heck, MD;  Location: [place] [facility];  Service: Cardiovascular;  Laterality: Right;  Superficial Femoral Artery   PITUITARY SURGERY     TRANSTHORACIC ECHOCARDIOGRAM  08/30/2013   mild LVH. EF 50-55%. Moderate HK of the entire inferior myocardium. GR 1 DD. Mild LA dilation. Mildly reduced RV function   TYMPANOMASTOIDECTOMY Right 12/28/2017   Procedure: RIGHT TYMPANOMASTOIDECTOMY;  Surgeon: Leta Baptist, MD;  Location: [place];  Service: ENT;  Laterality: Right;   Social History   Occupational History   Not on file  Tobacco Use   Smoking status: Former   Smokeless tobacco: Never  Substance and Sexual Activity   Alcohol use: No   Drug use: No   Sexual activity: Not Currently    Birth control/protection: Post-menopausal

## 2021-01-01 ENCOUNTER — Other Ambulatory Visit (HOSPITAL_COMMUNITY): Payer: BC Managed Care – PPO

## 2021-01-01 ENCOUNTER — Ambulatory Visit (HOSPITAL_COMMUNITY)
Admission: RE | Admit: 2021-01-01 | Discharge: 2021-01-01 | Disposition: A | Discharge status: Home / Self Care | Payer: Medicare Other | Source: Ambulatory Visit | Attending: Physician Assistant | Admitting: Physician Assistant

## 2021-01-01 ENCOUNTER — Ambulatory Visit (INDEPENDENT_AMBULATORY_CARE_PROVIDER_SITE_OTHER)
Admission: RE | Admit: 2021-01-01 | Discharge: 2021-01-01 | Disposition: A | Discharge status: Home / Self Care | Payer: Medicare Other | Source: Ambulatory Visit | Attending: Physician Assistant | Admitting: Physician Assistant

## 2021-01-01 ENCOUNTER — Encounter (HOSPITAL_COMMUNITY): Payer: BC Managed Care – PPO

## 2021-01-01 ENCOUNTER — Ambulatory Visit (INDEPENDENT_AMBULATORY_CARE_PROVIDER_SITE_OTHER): Payer: Medicare Other | Admitting: Physician Assistant

## 2021-01-01 ENCOUNTER — Ambulatory Visit: Payer: BC Managed Care – PPO

## 2021-01-01 VITALS — BP 131/65 | HR 61 | Temp 98.2°F | Resp 20 | Ht 67.0 in | Wt 224.6 lb

## 2021-01-01 DIAGNOSIS — I2510 Atherosclerotic heart disease of native coronary artery without angina pectoris: Secondary | ICD-10-CM | POA: Diagnosis not present

## 2021-01-01 DIAGNOSIS — I739 Peripheral vascular disease, unspecified: Secondary | ICD-10-CM | POA: Diagnosis present

## 2021-01-01 DIAGNOSIS — Z9861 Coronary angioplasty status: Secondary | ICD-10-CM

## 2021-01-01 NOTE — Progress Notes (Signed)
[facility] [place] HISTORY AND PHYSICAL   History of Present Illness:  Patient is a 70 y.o. year old female who presents for evaluation of with history of B LE tissue loss requiring aortogram and intervention.  He wounds have healed completely and she denise rest pain, new non healing wounds or claudication symptoms.    She is s/p proximal left SFA atherectomy and stenting by [name] on 05/26/19 and Right mid to distal SFA and above-knee popliteal artery angioplasty with stent placement on 11/15/20.  The pt returns today for ABI and arterial duplex.    Past Medical History:  Diagnosis Date   CAD S/P percutaneous coronary angioplasty 08/2013   100% mRCA - PCI Integrity Resolute DES 3.0 mm x 38 mm - 3.35 mm; PTCA of RPA V 2.0 mm x 15 mm   Cholesteatoma    right   Diabetes mellitus type 2 in obese (HCC)    On insulin and Invokana   History of osteomyelitis L 5th Toe all 05/2019   s/p Partial Ray Amputation with partial closure; 6 wks Abx & LSFA Atherectomy/DEB PTA with Stent for focal dissection.   Hyperlipidemia with target LDL less than 70    Hypothyroidism (acquired)    Mild essential hypertension    Obesity (BMI 30-39.9) 11/17/2013   PAD (peripheral artery disease) ([place]) 05/26/2019   05/26/19: Abd AoGram- BLE runoff -> L SFA orbital atherectomy - PTA w/ DEB & Stent 6 x 40 Luttonix (for focal dissection) - patent Pop A with 3 V runoff. LEA Dopplers 01/03/2020: RABI (prev) 0.68 (0.53)/ RTBI (prev) 0.58 (0.33); LABI (prev) 0.80 (0.64), LTBI (prev) 0.64 (0.51); R mSFA ~50-74%, L mSFA 50-74%. Patent Prox SFA stent < 49% stenosis   Panhypopituitarism (HCC)    ST elevation myocardial infarction (STEMI) of inferior wall, subsequent episode of care ([place]) 08/2013   80% branch of D1, 40% mid AV groove circumflex, 100% RCA with subacute thrombus -- thrombus extending into RPA V with 100% occlusion after initial angioplasty of mid RCA ;; Post MI ECHO 6/9/'15: EF 50-55%, mild LVH with  moderate HK of inferior wall, Gr1 DD, mild LA dilation; mildly reduced RV function    Past Surgical History:  Procedure Laterality Date   ABDOMINAL AORTOGRAM W/LOWER EXTREMITY N/A 05/26/2019   Procedure: ABDOMINAL AORTOGRAM W/LOWER EXTREMITY;  Surgeon: Marty Heck, MD;  Location: [facility];  Service: Cardiovascular;  Laterality: N/A;   ABDOMINAL AORTOGRAM W/LOWER EXTREMITY N/A 11/15/2020   Procedure: ABDOMINAL AORTOGRAM W/LOWER EXTREMITY;  Surgeon: Marty Heck, MD;  Location: [facility];  Service: Cardiovascular;  Laterality: N/A;   AMPUTATION Left 05/25/2019   Procedure: AMPUTATION RAY 5th;  Surgeon: Trula Slade, DPM;  Location: [facility];  Service: Podiatry;  Laterality: Left;   Cardiac Event Monitor  July-August 2015   Sinus rhythm with PVCs   COLONOSCOPY N/A 08/31/2013   Procedure: COLONOSCOPY;  Surgeon: Juanita Craver, MD;  Location: [facility] ENDOSCOPY;  Service: Endoscopy;  Laterality: N/A;   CORONARY STENT INTERVENTION N/A 11/19/2020   Procedure: CORONARY STENT INTERVENTION;  Surgeon: Nelva Bush, MD;  Location: [facility];  Service: Cardiovascular;  Laterality: N/A;   ESOPHAGOGASTRODUODENOSCOPY N/A 09/01/2013   Procedure: ESOPHAGOGASTRODUODENOSCOPY (EGD);  Surgeon: Beryle Beams, MD;  Location: [facility] ENDOSCOPY;  Service: Endoscopy;  Laterality: N/A;  bedside   LEFT HEART CATH AND CORONARY ANGIOGRAPHY N/A 11/19/2020   Procedure: LEFT HEART CATH AND CORONARY ANGIOGRAPHY;  Surgeon: Nelva Bush, MD;  Location:  [place] [facility];  Service: Cardiovascular;  Laterality: N/A;   LEFT HEART CATHETERIZATION WITH CORONARY ANGIOGRAM N/A 08/30/2013   Procedure: LEFT HEART CATHETERIZATION WITH CORONARY ANGIOGRAM;  Surgeon: Leonie Man, MD;  Location: [facility] CATH LAB: 100% mRCA (thrombus - extends to RPAV), 80% D1, 40% AVG Cx.   PERCUTANEOUS CORONARY STENT INTERVENTION (PCI-S)  08/30/2013   Procedure: PERCUTANEOUS CORONARY STENT INTERVENTION (PCI-S);   Surgeon: Leonie Man, MD;  Location: [facility] CATH LAB;  Integrity Resolute DES 2.0 mm x 38 mm -- 3.35 mm.; PTCA of proximal RPA V. - 3.0 mm x 15 mm balloon   PERIPHERAL VASCULAR ATHERECTOMY  05/26/2019   Procedure: PERIPHERAL VASCULAR ATHERECTOMY;  Surgeon: Marty Heck, MD;  Location: [place] [facility];  Service: Cardiovascular;;  Left SFA   PERIPHERAL VASCULAR INTERVENTION  05/26/2019   Procedure: PERIPHERAL VASCULAR INTERVENTION;  Surgeon: Marty Heck, MD;  Location: [place] [facility];  Service: Cardiovascular;;  Left SFA   PERIPHERAL VASCULAR INTERVENTION Right 11/15/2020   Procedure: PERIPHERAL VASCULAR INTERVENTION;  Surgeon: Marty Heck, MD;  Location: [place] [facility];  Service: Cardiovascular;  Laterality: Right;  Superficial Femoral Artery   PITUITARY SURGERY     TRANSTHORACIC ECHOCARDIOGRAM  08/30/2013   mild LVH. EF 50-55%. Moderate HK of the entire inferior myocardium. GR 1 DD. Mild LA dilation. Mildly reduced RV function   TYMPANOMASTOIDECTOMY Right 12/28/2017   Procedure: RIGHT TYMPANOMASTOIDECTOMY;  Surgeon: Leta Baptist, MD;  Location: [place];  Service: ENT;  Laterality: Right;    ROS:   General:  No weight loss, Fever, chills  HEENT: No recent headaches, no nasal bleeding, no visual changes, no sore throat  Neurologic: No dizziness, blackouts, seizures. No recent symptoms of stroke or mini- stroke. No recent episodes of slurred speech, or temporary blindness.  Cardiac: No recent episodes of chest pain/pressure, no shortness of breath at rest.  No shortness of breath with exertion.  Denies history of atrial fibrillation or irregular heartbeat  Vascular: No history of rest pain in feet.  No history of claudication.  No history of non-healing ulcer, No history of DVT   Pulmonary: No home oxygen, no productive cough, no hemoptysis,  No asthma or wheezing  Musculoskeletal:  '[ ]'  Arthritis, '[ ]'  Low back pain,  '[ ]'  Joint  pain  Hematologic:No history of hypercoagulable state.  No history of easy bleeding.  No history of anemia  Gastrointestinal: No hematochezia or melena,  No gastroesophageal reflux, no trouble swallowing  Urinary: '[ ]'  chronic Kidney disease, '[ ]'  on HD - '[ ]'  MWF or '[ ]'  TTHS, '[ ]'  Burning with urination, '[ ]'  Frequent urination, '[ ]'  Difficulty urinating;   Skin: No rashes  Psychological: No history of anxiety,  No history of depression  Social History Social History   Tobacco Use   Smoking status: Former   Smokeless tobacco: Never  Substance Use Topics   Alcohol use: No   Drug use: No    Family History Family History  Problem Relation Age of Onset   Cancer Mother 57       multiple myeloma   Heart attack Father 64   Cancer Sister    Alzheimer's disease Maternal Grandmother     Allergies  Allergies  Allergen Reactions   Strawberry Extract Itching, Swelling and Anaphylaxis    Mouth swells and gets itchy     Current Outpatient Medications  Medication Sig Dispense Refill   apixaban (ELIQUIS) 5 MG TABS  tablet Take 1 tablet (5 mg total) by mouth 2 (two) times daily. 60 tablet 6   atorvastatin (LIPITOR) 40 MG tablet Take 1 tablet (40 mg total) by mouth daily at 6 PM. 30 tablet 0   B-D UF III MINI PEN NEEDLES 31G X 5 MM MISC Inject into the skin.     clopidogrel (PLAVIX) 75 MG tablet Take 1 tablet by mouth once daily with breakfast 90 tablet 0   Continuous Blood Gluc Sensor (FREESTYLE LIBRE 2 SENSOR) MISC Apply topically every 14 (fourteen) days.     dexamethasone (DECADRON) 0.5 MG tablet Take 0.5 mg by mouth daily.     FARXIGA 10 MG TABS tablet Take 10 mg by mouth daily.      ferrous sulfate 325 (65 FE) MG tablet Take 325 mg by mouth daily with breakfast.     Insulin Glargine (BASAGLAR KWIKPEN) 100 UNIT/ML SOPN Inject 48 Units into the skin at bedtime.     levothyroxine (SYNTHROID) 125 MCG tablet Take 1 tablet (125 mcg total) by mouth daily before breakfast. 30 tablet 2    losartan (COZAAR) 25 MG tablet Take 1 tablet (25 mg total) by mouth daily. 30 tablet 11   metoprolol succinate (TOPROL-XL) 25 MG 24 hr tablet Take 1 tablet (25 mg total) by mouth in the morning and at bedtime. 60 tablet 0   nitroGLYCERIN (NITROSTAT) 0.4 MG SL tablet Place 1 tablet (0.4 mg total) under the tongue every 5 (five) minutes as needed for chest pain (up to 3 doses. If taking 3rd dose call 911). 25 tablet 3   NOVOLOG FLEXPEN 100 UNIT/ML FlexPen Inject 5-15 Units into the skin 3 (three) times daily with meals. Per sliding scale     nystatin cream (MYCOSTATIN) Apply 1 application topically 2 (two) times daily. (Patient taking differently: Apply 1 application topically 2 (two) times daily as needed (rash).) 30 g 1   Omega-3 Fatty Acids (FISH OIL) 1000 MG CAPS Take 1,000 mg by mouth daily.     ONETOUCH VERIO test strip   1   pantoprazole (PROTONIX) 40 MG tablet Take 1 tablet (40 mg total) by mouth 2 (two) times daily before a meal. 60 tablet 0   Propylene Glycol (SYSTANE COMPLETE) 0.6 % SOLN Place 1 drop into both eyes daily as needed (dry eyes).     silver sulfADIAZINE (SILVADENE) 1 % cream Apply 1 application topically daily. Apply thin layer to leg wound daily 50 g 0   spironolactone (ALDACTONE) 25 MG tablet Take 1 tablet (25 mg total) by mouth daily. 30 tablet 11   No current facility-administered medications for this visit.    Physical Examination  Vitals:   01/01/21 0945  BP: 131/65  Pulse: 61  Resp: 20  Temp: 98.2 F (36.8 C)  TempSrc: Temporal  SpO2: 96%  Weight: 224 lb 9.6 oz (101.9 kg)  Height: '5\' 7"'  (1.702 m)    Body mass index is 35.18 kg/m.  General:  Alert and oriented, no acute distress HEENT: Normal Neck: No bruit or JVD Pulmonary: Clear to auscultation bilaterally Cardiac: Regular Rate and Rhythm without murmur Abdomen: Soft, non-tender, non-distended, no mass, no scars Skin: No rash, well healed previous wound sites Musculoskeletal: No deformity or  edema  Neurologic: Upper and lower extremity motor intact and symmetric  DATA:  +----------+--------+-----+--------+---------+---------+  RIGHT     PSV cm/sRatioStenosisWaveform Comments   +----------+--------+-----+--------+---------+---------+  CFA Mid   163  biphasic            +----------+--------+-----+--------+---------+---------+  CFA Distal151                  triphasic           +----------+--------+-----+--------+---------+---------+  DFA       130                  biphasic            +----------+--------+-----+--------+---------+---------+  SFA Prox  130                  biphasic            +----------+--------+-----+--------+---------+---------+  SFA Mid                                 see stent  +----------+--------+-----+--------+---------+---------+  SFA Distal                              see stent  +----------+--------+-----+--------+---------+---------+  POP Prox                                see stent  +----------+--------+-----+--------+---------+---------+      Right Stent(s):  +---------------+--------+--------+--------+--------+  SFA/popliteal  PSV cm/sStenosisWaveformComments  +---------------+--------+--------+--------+--------+  Prox to Stent  158             biphasic          +---------------+--------+--------+--------+--------+  Proximal Stent 145             biphasic          +---------------+--------+--------+--------+--------+  Mid Stent      74              biphasic          +---------------+--------+--------+--------+--------+  Distal Stent   56              biphasic          +---------------+--------+--------+--------+--------+  Distal to Stent136             biphasic          +---------------+--------+--------+--------+--------+    Summary:  Right: Patent stent with no evidence of stenosis in the SFA/popliteal  artery.       ABI  Findings:  +---------+------------------+-----+---------+--------+  Right    Rt Pressure (mmHg)IndexWaveform Comment   +---------+------------------+-----+---------+--------+  Brachial 147                                       +---------+------------------+-----+---------+--------+  PTA      145               0.99 triphasic          +---------+------------------+-----+---------+--------+  DP       137               0.93 triphasic          +---------+------------------+-----+---------+--------+  Great Toe108               0.73                    +---------+------------------+-----+---------+--------+   +---------+------------------+-----+--------+-------+  Left     Lt Pressure (mmHg)IndexWaveformComment  +---------+------------------+-----+--------+-------+  Brachial  146                                     +---------+------------------+-----+--------+-------+  PTA      116               0.79 biphasic         +---------+------------------+-----+--------+-------+  DP       123               0.84 biphasic         +---------+------------------+-----+--------+-------+  Great Toe86                0.59                  +---------+------------------+-----+--------+-------+   +-------+-----------+-----------+------------+------------+  ABI/TBIToday's ABIToday's TBIPrevious ABIPrevious TBI  +-------+-----------+-----------+------------+------------+  Right  0.99       0.73       0.53        0.40          +-------+-----------+-----------+------------+------------+  Left   0.84       0.59       0.72        0.55          +-------+-----------+-----------+------------+------------+       Summary:  Right: Resting right ankle-brachial index is within normal range. No  evidence of significant right lower extremity arterial disease. The right  toe-brachial index is normal.   Left: Resting left ankle-brachial index  indicates mild left lower  extremity arterial disease. The left toe-brachial index is abnormal.   ASSESSMENT/PLAN:  PAD s/p s/p proximal left SFA atherectomy and stenting by [name] on 05/26/19 and Right mid to distal SFA and above-knee popliteal artery angioplasty with stent placement on 11/15/20. Stable disposition with improved inflow demonstrated on ABI's and arterial duplex.   She denise new symptoms of ischemia.  She will continue asa/plavix/statin.  If she develops problems or concerns she will call sooner.  F/U in 9 months for repeat studies.        Roxy Horseman PA-C [facility] [place] Office: [phone]  MD in clinic [place]

## 2021-01-02 ENCOUNTER — Ambulatory Visit: Payer: Medicare Other | Admitting: Physical Therapy

## 2021-01-02 ENCOUNTER — Other Ambulatory Visit: Payer: Self-pay

## 2021-01-02 DIAGNOSIS — I739 Peripheral vascular disease, unspecified: Secondary | ICD-10-CM

## 2021-01-02 DIAGNOSIS — G8929 Other chronic pain: Secondary | ICD-10-CM

## 2021-01-02 DIAGNOSIS — R29898 Other symptoms and signs involving the musculoskeletal system: Secondary | ICD-10-CM

## 2021-01-02 DIAGNOSIS — M6281 Muscle weakness (generalized): Secondary | ICD-10-CM

## 2021-01-02 DIAGNOSIS — R600 Localized edema: Secondary | ICD-10-CM

## 2021-01-03 NOTE — Therapy (Signed)
[place], [place], [address] Phone: [phone]   Fax:  [phone]  Physical Therapy Treatment  Patient Details  Name: Tammy Good MRN: [id number] Date of Birth: Aug 01, 1950 Referring Provider (PT): Dr. Monica Martinez (Dr Sharol Given for wound)   Encounter Date: 01/02/2021   PT End of Session - 01/03/21 1255     Visit Number 0            Pt arrived , not seen due to illness.    Past Medical History:  Diagnosis Date   CAD S/P percutaneous coronary angioplasty 08/2013   100% mRCA - PCI Integrity Resolute DES 3.0 mm x 38 mm - 3.35 mm; PTCA of RPA V 2.0 mm x 15 mm   Cholesteatoma    right   Diabetes mellitus type 2 in obese (HCC)    On insulin and Invokana   History of osteomyelitis L 5th Toe all 05/2019   s/p Partial Ray Amputation with partial closure; 6 wks Abx & LSFA Atherectomy/DEB PTA with Stent for focal dissection.   Hyperlipidemia with target LDL less than 70    Hypothyroidism (acquired)    Mild essential hypertension    Obesity (BMI 30-39.9) 11/17/2013   PAD (peripheral artery disease) ([place]) 05/26/2019   05/26/19: Abd AoGram- BLE runoff -> L SFA orbital atherectomy - PTA w/ DEB & Stent 6 x 40 Luttonix (for focal dissection) - patent Pop A with 3 V runoff. LEA Dopplers 01/03/2020: RABI (prev) 0.68 (0.53)/ RTBI (prev) 0.58 (0.33); LABI (prev) 0.80 (0.64), LTBI (prev) 0.64 (0.51); R mSFA ~50-74%, L mSFA 50-74%. Patent Prox SFA stent < 49% stenosis   Panhypopituitarism (HCC)    ST elevation myocardial infarction (STEMI) of inferior wall, subsequent episode of care ([place]) 08/2013   80% branch of D1, 40% mid AV groove circumflex, 100% RCA with subacute thrombus -- thrombus extending into RPA V with 100% occlusion after initial angioplasty of mid RCA ;; Post MI ECHO 6/9/'15: EF 50-55%, mild LVH with moderate HK of inferior wall, Gr1 DD, mild LA dilation; mildly reduced RV function    Past Surgical  History:  Procedure Laterality Date   ABDOMINAL AORTOGRAM W/LOWER EXTREMITY N/A 05/26/2019   Procedure: ABDOMINAL AORTOGRAM W/LOWER EXTREMITY;  Surgeon: Marty Heck, MD;  Location: [place] [facility];  Service: Cardiovascular;  Laterality: N/A;   ABDOMINAL AORTOGRAM W/LOWER EXTREMITY N/A 11/15/2020   Procedure: ABDOMINAL AORTOGRAM W/LOWER EXTREMITY;  Surgeon: Marty Heck, MD;  Location: [place] [facility];  Service: Cardiovascular;  Laterality: N/A;   AMPUTATION Left 05/25/2019   Procedure: AMPUTATION RAY 5th;  Surgeon: Trula Slade, DPM;  Location: [place];  Service: Podiatry;  Laterality: Left;   Cardiac Event Monitor  July-August 2015   Sinus rhythm with PVCs   COLONOSCOPY N/A 08/31/2013   Procedure: COLONOSCOPY;  Surgeon: Juanita Craver, MD;  Location: [facility] ENDOSCOPY;  Service: Endoscopy;  Laterality: N/A;   CORONARY STENT INTERVENTION N/A 11/19/2020   Procedure: CORONARY STENT INTERVENTION;  Surgeon: Nelva Bush, MD;  Location: [place] [facility];  Service: Cardiovascular;  Laterality: N/A;   ESOPHAGOGASTRODUODENOSCOPY N/A 09/01/2013   Procedure: ESOPHAGOGASTRODUODENOSCOPY (EGD);  Surgeon: Beryle Beams, MD;  Location: [facility] ENDOSCOPY;  Service: Endoscopy;  Laterality: N/A;  bedside   LEFT HEART CATH AND CORONARY ANGIOGRAPHY N/A 11/19/2020   Procedure: LEFT HEART CATH AND CORONARY ANGIOGRAPHY;  Surgeon: Nelva Bush, MD;  Location: [place] [facility];  Service: Cardiovascular;  Laterality: N/A;   LEFT  HEART CATHETERIZATION WITH CORONARY ANGIOGRAM N/A 08/30/2013   Procedure: LEFT HEART CATHETERIZATION WITH CORONARY ANGIOGRAM;  Surgeon: Leonie Man, MD;  Location: [facility] CATH LAB: 100% mRCA (thrombus - extends to RPAV), 80% D1, 40% AVG Cx.   PERCUTANEOUS CORONARY STENT INTERVENTION (PCI-S)  08/30/2013   Procedure: PERCUTANEOUS CORONARY STENT INTERVENTION (PCI-S);  Surgeon: Leonie Man, MD;  Location: [facility] CATH LAB;  Integrity Resolute DES 2.0 mm x 38 mm -- 3.35 mm.;  PTCA of proximal RPA V. - 3.0 mm x 15 mm balloon   PERIPHERAL VASCULAR ATHERECTOMY  05/26/2019   Procedure: PERIPHERAL VASCULAR ATHERECTOMY;  Surgeon: Marty Heck, MD;  Location: [place] [facility] LAB;  Service: Cardiovascular;;  Left SFA   PERIPHERAL VASCULAR INTERVENTION  05/26/2019   Procedure: PERIPHERAL VASCULAR INTERVENTION;  Surgeon: Marty Heck, MD;  Location: [place] [facility];  Service: Cardiovascular;;  Left SFA   PERIPHERAL VASCULAR INTERVENTION Right 11/15/2020   Procedure: PERIPHERAL VASCULAR INTERVENTION;  Surgeon: Marty Heck, MD;  Location: [place] [facility] LAB;  Service: Cardiovascular;  Laterality: Right;  Superficial Femoral Artery   PITUITARY SURGERY     TRANSTHORACIC ECHOCARDIOGRAM  08/30/2013   mild LVH. EF 50-55%. Moderate HK of the entire inferior myocardium. GR 1 DD. Mild LA dilation. Mildly reduced RV function   TYMPANOMASTOIDECTOMY Right 12/28/2017   Procedure: RIGHT TYMPANOMASTOIDECTOMY;  Surgeon: Leta Baptist, MD;  Location: [place];  Service: ENT;  Laterality: Right;    There were no vitals filed for this visit.                                 PT Short Term Goals - 12/17/20 1202       PT SHORT TERM GOAL #1   Title Pt will be I with HEP for general strength and conditioning    Time 4    Period Weeks    Status New    Target Date 01/14/21      PT SHORT TERM GOAL #2   Title Pt will report less Rt hip and back pain with standing tasks in her home, avg 5/10    Time 4    Period Weeks    Status New    Target Date 01/14/21      PT SHORT TERM GOAL #3   Title Pt will be able to walk > 250 feet for 2 min walk test due to improved endurance    Baseline 220    Time 4    Period Weeks    Status New    Target Date 01/14/21               PT Long Term Goals - 12/17/20 1204       PT LONG TERM GOAL #1   Title pt will be I with HEP upon discharge for more comprehensive program for back and  hip    Time 8    Period Weeks    Status New    Target Date 02/11/21      PT LONG TERM GOAL #2   Title Pt will be able to stand without UEs x 5 in < 30 sec    Time 8    Period Weeks    Status New    Target Date 02/11/21      PT LONG TERM GOAL #3   Title Pt will be able to walk in the community with improved  gait, limp for 500 feet and no device.    Time 8    Period Weeks    Status New    Target Date 02/11/21      PT LONG TERM GOAL #4   Title Pt will be able to get on and off the mat table without increased pain    Time 8    Period Weeks    Status New    Target Date 02/11/21                    Patient will benefit from skilled therapeutic intervention in order to improve the following deficits and impairments:     Visit Diagnosis: Muscle weakness (generalized)  Other symptoms and signs involving the musculoskeletal system  Chronic left-sided low back pain with sciatica, sciatica laterality unspecified  Localized edema     Problem List Patient Active Problem List   Diagnosis Date Noted   Fatty liver 06/19/2020   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 ([place]) 01/77/9390   Atherosclerotic heart disease of native coronary artery without angina pectoris 03/20/2020   Epigastric pain 03/20/2020   Nausea and vomiting 03/20/2020   Absence of toe ([place]) 06/28/2019   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   PAD (peripheral artery disease) ([place]) 05/26/2019   Osteomyelitis of left foot ([place]) 05/24/2019   Osteomyelitis ([place]) 05/23/2019   Cellulitis and abscess of foot, except toes 05/18/2019   Chronic osteomyelitis of ankle and foot ([place]) 05/18/2019   Cholesteatoma 02/24/2019   Cellulitis and abscess of toe 11/09/2018   Colon cancer screening 11/30/2017   Long term (current) use of insulin ([place]) 03/05/2017   Iron deficiency anemia 12/10/2014   Obesity (BMI 30-39.9) 11/17/2013   Diabetes mellitus type 2 in obese [facility])    Essential  hypertension    Right thigh pain 11/01/2013   PVC's (premature ventricular contractions) 11/01/2013   Status post insertion of drug eluting coronary artery stent to [facility] emergently and PTCA to prox. PLA 09/16/2013   Hyperlipidemia associated with type 2 diabetes mellitus ([place]) 09/16/2013   Presence of coronary angioplasty implant and graft 09/08/2013   Panhypopituitarism ([place]) 08/30/2013   Non-ST elevation (NSTEMI) myocardial infarction [name] 08/22/2013   CAD S/P percutaneous coronary angioplasty 08/22/2013    Kitty Cadavid, PT 01/03/2021, 12:55 PM  [place], [place], [address] Phone: [phone]   Fax:  [phone]  Name: Tammy Good MRN: [id number] Date of Birth: Aug 01, 1950  Raeford Razor, PT 01/03/21 12:56 PM Phone: [phone] Fax: [phone]

## 2021-01-04 ENCOUNTER — Telehealth: Payer: Self-pay | Admitting: Cardiology

## 2021-01-04 MED ORDER — METOPROLOL SUCCINATE ER 25 MG PO TB24: 25.0000 mg | ORAL_TABLET | Freq: Two times a day (BID) | ORAL | 0 refills | Status: DC

## 2021-01-04 NOTE — Telephone Encounter (Signed)
*  STAT* If patient is at the pharmacy, call can be transferred to refill team.   1. Which medications need to be refilled? (please list name of each medication and dose if known) metoprolol succinate (TOPROL-XL) 25 MG 24 hr tablet  2. Which pharmacy/location (including street and city if local pharmacy) is medication to be sent to? [place], [place]  3. Do they need a 30 day or 90 day supply? 90  Patient is out of medication

## 2021-01-07 ENCOUNTER — Ambulatory Visit: Payer: Medicare Other | Admitting: Physical Therapy

## 2021-01-07 ENCOUNTER — Encounter: Payer: Self-pay | Admitting: Physical Therapy

## 2021-01-07 ENCOUNTER — Other Ambulatory Visit: Payer: Self-pay

## 2021-01-07 DIAGNOSIS — G8929 Other chronic pain: Secondary | ICD-10-CM

## 2021-01-07 DIAGNOSIS — M6281 Muscle weakness (generalized): Secondary | ICD-10-CM | POA: Diagnosis not present

## 2021-01-07 DIAGNOSIS — R600 Localized edema: Secondary | ICD-10-CM

## 2021-01-07 DIAGNOSIS — R29898 Other symptoms and signs involving the musculoskeletal system: Secondary | ICD-10-CM

## 2021-01-07 NOTE — Therapy (Signed)
[place], [place], [address] Phone: [phone]   Fax:  [phone]  Physical Therapy Treatment  Patient Details  Name: Tammy Good MRN: [id number] Date of Birth: May 03, 1950 Referring Provider (PT): Dr. Monica Martinez (Dr Sharol Given for wound)   Encounter Date: 01/07/2021   PT End of Session - 01/07/21 0912     Visit Number 3    Number of Visits 8    Date for PT Re-Evaluation 02/11/21    Authorization Type MCR, BCBS    PT Start Time [phone]    PT Stop Time 0930    PT Time Calculation (min) 44 min    Activity Tolerance Patient tolerated treatment well    Behavior During Therapy [facility] for tasks assessed/performed             Past Medical History:  Diagnosis Date   CAD S/P percutaneous coronary angioplasty 08/2013   100% mRCA - PCI Integrity Resolute DES 3.0 mm x 38 mm - 3.35 mm; PTCA of RPA V 2.0 mm x 15 mm   Cholesteatoma    right   Diabetes mellitus type 2 in obese (HCC)    On insulin and Invokana   History of osteomyelitis L 5th Toe all 05/2019   s/p Partial Ray Amputation with partial closure; 6 wks Abx & LSFA Atherectomy/DEB PTA with Stent for focal dissection.   Hyperlipidemia with target LDL less than 70    Hypothyroidism (acquired)    Mild essential hypertension    Obesity (BMI 30-39.9) 11/17/2013   PAD (peripheral artery disease) ([place]) 05/26/2019   05/26/19: Abd AoGram- BLE runoff -> L SFA orbital atherectomy - PTA w/ DEB & Stent 6 x 40 Luttonix (for focal dissection) - patent Pop A with 3 V runoff. LEA Dopplers 01/03/2020: RABI (prev) 0.68 (0.53)/ RTBI (prev) 0.58 (0.33); LABI (prev) 0.80 (0.64), LTBI (prev) 0.64 (0.51); R mSFA ~50-74%, L mSFA 50-74%. Patent Prox SFA stent < 49% stenosis   Panhypopituitarism (HCC)    ST elevation myocardial infarction (STEMI) of inferior wall, subsequent episode of care ([place]) 08/2013   80% branch of D1, 40% mid AV groove circumflex, 100% RCA with subacute  thrombus -- thrombus extending into RPA V with 100% occlusion after initial angioplasty of mid RCA ;; Post MI ECHO 6/9/'15: EF 50-55%, mild LVH with moderate HK of inferior wall, Gr1 DD, mild LA dilation; mildly reduced RV function    Past Surgical History:  Procedure Laterality Date   ABDOMINAL AORTOGRAM W/LOWER EXTREMITY N/A 05/26/2019   Procedure: ABDOMINAL AORTOGRAM W/LOWER EXTREMITY;  Surgeon: Marty Heck, MD;  Location: [place] CV LAB;  Service: Cardiovascular;  Laterality: N/A;   ABDOMINAL AORTOGRAM W/LOWER EXTREMITY N/A 11/15/2020   Procedure: ABDOMINAL AORTOGRAM W/LOWER EXTREMITY;  Surgeon: Marty Heck, MD;  Location: [place] [facility];  Service: Cardiovascular;  Laterality: N/A;   AMPUTATION Left 05/25/2019   Procedure: AMPUTATION RAY 5th;  Surgeon: Trula Slade, DPM;  Location: [place];  Service: Podiatry;  Laterality: Left;   Cardiac Event Monitor  July-August 2015   Sinus rhythm with PVCs   COLONOSCOPY N/A 08/31/2013   Procedure: COLONOSCOPY;  Surgeon: Juanita Craver, MD;  Location: [facility] ENDOSCOPY;  Service: Endoscopy;  Laterality: N/A;   CORONARY STENT INTERVENTION N/A 11/19/2020   Procedure: CORONARY STENT INTERVENTION;  Surgeon: Nelva Bush, MD;  Location: [place] CV LAB;  Service: Cardiovascular;  Laterality: N/A;   ESOPHAGOGASTRODUODENOSCOPY N/A 09/01/2013   Procedure: ESOPHAGOGASTRODUODENOSCOPY (EGD);  Surgeon: Saralyn Pilar  Renee Ramus, MD;  Location: [place];  Service: Endoscopy;  Laterality: N/A;  bedside   LEFT HEART CATH AND CORONARY ANGIOGRAPHY N/A 11/19/2020   Procedure: LEFT HEART CATH AND CORONARY ANGIOGRAPHY;  Surgeon: Nelva Bush, MD;  Location: [place] CV LAB;  Service: Cardiovascular;  Laterality: N/A;   LEFT HEART CATHETERIZATION WITH CORONARY ANGIOGRAM N/A 08/30/2013   Procedure: LEFT HEART CATHETERIZATION WITH CORONARY ANGIOGRAM;  Surgeon: Leonie Man, MD;  Location: [facility] CATH LAB: 100% mRCA (thrombus - extends to RPAV), 80%  D1, 40% AVG Cx.   PERCUTANEOUS CORONARY STENT INTERVENTION (PCI-S)  08/30/2013   Procedure: PERCUTANEOUS CORONARY STENT INTERVENTION (PCI-S);  Surgeon: Leonie Man, MD;  Location: [facility] CATH LAB;  Integrity Resolute DES 2.0 mm x 38 mm -- 3.35 mm.; PTCA of proximal RPA V. - 3.0 mm x 15 mm balloon   PERIPHERAL VASCULAR ATHERECTOMY  05/26/2019   Procedure: PERIPHERAL VASCULAR ATHERECTOMY;  Surgeon: Marty Heck, MD;  Location: [place] CV LAB;  Service: Cardiovascular;;  Left SFA   PERIPHERAL VASCULAR INTERVENTION  05/26/2019   Procedure: PERIPHERAL VASCULAR INTERVENTION;  Surgeon: Marty Heck, MD;  Location: [place] CV LAB;  Service: Cardiovascular;;  Left SFA   PERIPHERAL VASCULAR INTERVENTION Right 11/15/2020   Procedure: PERIPHERAL VASCULAR INTERVENTION;  Surgeon: Marty Heck, MD;  Location: [place] CV LAB;  Service: Cardiovascular;  Laterality: Right;  Superficial Femoral Artery   PITUITARY SURGERY     TRANSTHORACIC ECHOCARDIOGRAM  08/30/2013   mild LVH. EF 50-55%. Moderate HK of the entire inferior myocardium. GR 1 DD. Mild LA dilation. Mildly reduced RV function   TYMPANOMASTOIDECTOMY Right 12/28/2017   Procedure: RIGHT TYMPANOMASTOIDECTOMY;  Surgeon: Leta Baptist, MD;  Location: [place];  Service: ENT;  Laterality: Right;    There were no vitals filed for this visit.   Subjective Assessment - 01/07/21 0853     Subjective At times I still feel like I lean.  Especially when it begins to hurt.  No pain this AM. I felt like I did better standing at the grocery store.  The booster took me a couple days to feel better.    Currently in Pain? No/denies                [facility] PT Assessment - 01/07/21 0001       Berg Balance Test   Sit to Stand Able to stand without using hands and stabilize independently    Standing Unsupported Able to stand safely 2 minutes    Sitting with Back Unsupported but Feet Supported on Floor or Stool Able to  sit safely and securely 2 minutes    Stand to Sit Sits safely with minimal use of hands    Transfers Able to transfer safely, definite need of hands    Standing Unsupported with Eyes Closed Able to stand 10 seconds safely    Standing Unsupported with Feet Together Able to place feet together independently and stand 1 minute safely    From Standing, Reach Forward with Outstretched Arm Can reach forward >12 cm safely (5")    From Standing Position, Pick up Object from Floor Able to pick up shoe safely and easily    From Standing Position, Turn to Look Behind Over each Shoulder Looks behind from both sides and weight shifts well    Turn 360 Degrees Able to turn 360 degrees safely one side only in 4 seconds or less    Standing Unsupported, Alternately Place Feet on  Step/Stool Able to stand independently and complete 8 steps >20 seconds    Standing Unsupported, One Foot in Front Able to place foot tandem independently and hold 30 seconds    Standing on One Leg Able to lift leg independently and hold 5-10 seconds    Total Score 51    Berg comment: walks with a cane if increased distance              [facility] Adult PT Treatment/Exercise - 01/07/21 0001       Knee/Hip Exercises: Aerobic   Nustep 6 min L 5 UE and LE 6 min              Skilled therapy interventions:   Therapeutic Exercise:  Lower trunk rotation x 10   SLR 2 x 10, 2nd set cued for form and reducing momentum  Bridge 2 x 10  Thomas test stretch 30 sec x 2 each   Standing calf raise  x 15   2 min walk, 302 feet 100% Sa O2 and HR 77          PT Short Term Goals - 01/07/21 0913       PT SHORT TERM GOAL #1   Title Pt will be I with HEP for general strength and conditioning    Status Achieved      PT SHORT TERM GOAL #2   Title Pt will report less Rt hip and back pain with standing tasks in her home, avg 5/10    Baseline 3/10 today with short periods , improving    Status Achieved      PT SHORT TERM GOAL #3    Title Pt will be able to walk > 250 feet for 2 min walk test due to improved endurance    Baseline 302 feet    Status Achieved               PT Long Term Goals - 12/17/20 1204       PT LONG TERM GOAL #1   Title pt will be I with HEP upon discharge for more comprehensive program for back and hip    Time 8    Period Weeks    Status New    Target Date 02/11/21      PT LONG TERM GOAL #2   Title Pt will be able to stand without UEs x 5 in < 30 sec    Time 8    Period Weeks    Status New    Target Date 02/11/21      PT LONG TERM GOAL #3   Title Pt will be able to walk in the community with improved gait, limp for 500 feet and no device.    Time 8    Period Weeks    Status New    Target Date 02/11/21      PT LONG TERM GOAL #4   Title Pt will be able to get on and off the mat table without increased pain    Time 8    Period Weeks    Status New    Target Date 02/11/21                   Plan - 01/07/21 0909     Clinical Impression Statement Short term goals are met.  Pt walked a bit faster and further in the 2 min walk test.  Pain increased in rt hip and back to 3/10- 5/10 with walking.  She  wil cont to benefit from PT to improve mobility. No need for cane with Berg score 51/54.  She uses it when walking long distances as that is appropriate, more for fatigue than pain .    PT Treatment/Interventions ADLs/Self Care Home Management;Gait training;Therapeutic exercise;Patient/family education;Manual techniques;Passive range of motion;Balance training;Moist Heat;Therapeutic activities;Functional mobility training;Cryotherapy;Electrical Stimulation    PT Next Visit Plan Cont general strength and flexibility, endurance and gen back/hip pain.    PT Home Exercise Plan Access Code: [email or web address]    Consulted and Agree with Plan of Care Patient             Patient will benefit from skilled therapeutic intervention in order to improve the following deficits and  impairments:  Cardiopulmonary status limiting activity, Decreased activity tolerance, Decreased strength, Increased fascial restricitons, Impaired flexibility, Pain, Postural dysfunction, Obesity, Increased edema, Decreased range of motion, Decreased endurance, Decreased mobility, Difficulty walking, Abnormal gait  Visit Diagnosis: Muscle weakness (generalized)  Other symptoms and signs involving the musculoskeletal system  Chronic left-sided low back pain with sciatica, sciatica laterality unspecified  Localized edema     Problem List Patient Active Problem List   Diagnosis Date Noted   Fatty liver 06/19/2020   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 ([place]) 65/99/3570   Atherosclerotic heart disease of native coronary artery without angina pectoris 03/20/2020   Epigastric pain 03/20/2020   Nausea and vomiting 03/20/2020   Absence of toe ([place]) 06/28/2019   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   PAD (peripheral artery disease) ([place]) 05/26/2019   Osteomyelitis of left foot ([place]) 05/24/2019   Osteomyelitis ([place]) 05/23/2019   Cellulitis and abscess of foot, except toes 05/18/2019   Chronic osteomyelitis of ankle and foot ([place]) 05/18/2019   Cholesteatoma 02/24/2019   Cellulitis and abscess of toe 11/09/2018   Colon cancer screening 11/30/2017   Long term (current) use of insulin ([place]) 03/05/2017   Iron deficiency anemia 12/10/2014   Obesity (BMI 30-39.9) 11/17/2013   Diabetes mellitus type 2 in obese [facility])    Essential hypertension    Right thigh pain 11/01/2013   PVC's (premature ventricular contractions) 11/01/2013   Status post insertion of drug eluting coronary artery stent to [facility] emergently and PTCA to prox. PLA 09/16/2013   Hyperlipidemia associated with type 2 diabetes mellitus ([place]) 09/16/2013   Presence of coronary angioplasty implant and graft 09/08/2013   Panhypopituitarism ([place]) 08/30/2013   Non-ST elevation (NSTEMI) myocardial  infarction [facility]) 08/22/2013   CAD S/P percutaneous coronary angioplasty 08/22/2013    Sufyaan Palma, PT 01/07/2021, 9:40 AM  [place], [place], [address] Phone: [phone]   Fax:  [phone]  Name: ALA KRATZ MRN: [id number] Date of Birth: 10/13/1950   Raeford Razor, PT 01/07/21 9:45 AM Phone: [phone] Fax: [phone]

## 2021-01-10 NOTE — Telephone Encounter (Signed)
Patient notified of Lpa results

## 2021-01-14 ENCOUNTER — Encounter: Payer: Self-pay | Admitting: Physical Therapy

## 2021-01-14 ENCOUNTER — Ambulatory Visit: Payer: Medicare Other | Admitting: Physical Therapy

## 2021-01-14 ENCOUNTER — Other Ambulatory Visit: Payer: Self-pay

## 2021-01-14 DIAGNOSIS — R600 Localized edema: Secondary | ICD-10-CM

## 2021-01-14 DIAGNOSIS — G8929 Other chronic pain: Secondary | ICD-10-CM

## 2021-01-14 DIAGNOSIS — R29898 Other symptoms and signs involving the musculoskeletal system: Secondary | ICD-10-CM

## 2021-01-14 DIAGNOSIS — M6281 Muscle weakness (generalized): Secondary | ICD-10-CM

## 2021-01-14 NOTE — Therapy (Signed)
[place], [place], [address] Phone: [phone]   Fax:  [phone]  Physical Therapy Treatment  Patient Details  Name: Tammy Good MRN: [id number] Date of Birth: 21-Jan-1951 Referring Provider (PT): Dr. Monica Martinez (Dr Sharol Given for wound)   Encounter Date: 01/14/2021   PT End of Session - 01/14/21 0847     Visit Number 4    Number of Visits 8    Date for PT Re-Evaluation 02/11/21    Authorization Type MCR, BCBS    PT Start Time [phone]    PT Stop Time 0925    PT Time Calculation (min) 42 min    Activity Tolerance Patient tolerated treatment well    Behavior During Therapy [facility] for tasks assessed/performed             Past Medical History:  Diagnosis Date   CAD S/P percutaneous coronary angioplasty 08/2013   100% mRCA - PCI Integrity Resolute DES 3.0 mm x 38 mm - 3.35 mm; PTCA of RPA V 2.0 mm x 15 mm   Cholesteatoma    right   Diabetes mellitus type 2 in obese (HCC)    On insulin and Invokana   History of osteomyelitis L 5th Toe all 05/2019   s/p Partial Ray Amputation with partial closure; 6 wks Abx & LSFA Atherectomy/DEB PTA with Stent for focal dissection.   Hyperlipidemia with target LDL less than 70    Hypothyroidism (acquired)    Mild essential hypertension    Obesity (BMI 30-39.9) 11/17/2013   PAD (peripheral artery disease) ([place]) 05/26/2019   05/26/19: Abd AoGram- BLE runoff -> L SFA orbital atherectomy - PTA w/ DEB & Stent 6 x 40 Luttonix (for focal dissection) - patent Pop A with 3 V runoff. LEA Dopplers 01/03/2020: RABI (prev) 0.68 (0.53)/ RTBI (prev) 0.58 (0.33); LABI (prev) 0.80 (0.64), LTBI (prev) 0.64 (0.51); R mSFA ~50-74%, L mSFA 50-74%. Patent Prox SFA stent < 49% stenosis   Panhypopituitarism (HCC)    ST elevation myocardial infarction (STEMI) of inferior wall, subsequent episode of care ([place]) 08/2013   80% branch of D1, 40% mid AV groove circumflex, 100% RCA with subacute  thrombus -- thrombus extending into RPA V with 100% occlusion after initial angioplasty of mid RCA ;; Post MI ECHO 6/9/'15: EF 50-55%, mild LVH with moderate HK of inferior wall, Gr1 DD, mild LA dilation; mildly reduced RV function    Past Surgical History:  Procedure Laterality Date   ABDOMINAL AORTOGRAM W/LOWER EXTREMITY N/A 05/26/2019   Procedure: ABDOMINAL AORTOGRAM W/LOWER EXTREMITY;  Surgeon: Marty Heck, MD;  Location: [place] CV LAB;  Service: Cardiovascular;  Laterality: N/A;   ABDOMINAL AORTOGRAM W/LOWER EXTREMITY N/A 11/15/2020   Procedure: ABDOMINAL AORTOGRAM W/LOWER EXTREMITY;  Surgeon: Marty Heck, MD;  Location: [place] CV LAB;  Service: Cardiovascular;  Laterality: N/A;   AMPUTATION Left 05/25/2019   Procedure: AMPUTATION RAY 5th;  Surgeon: Trula Slade, DPM;  Location: [place];  Service: Podiatry;  Laterality: Left;   Cardiac Event Monitor  July-August 2015   Sinus rhythm with PVCs   COLONOSCOPY N/A 08/31/2013   Procedure: COLONOSCOPY;  Surgeon: Juanita Craver, MD;  Location[facility] ENDOSCOPY;  Service: Endoscopy;  Laterality: N/A;   CORONARY STENT INTERVENTION N/A 11/19/2020   Procedure: CORONARY STENT INTERVENTION;  Surgeon: Nelva Bush, MD;  Location: [place] CV LAB;  Service: Cardiovascular;  Laterality: N/A;   ESOPHAGOGASTRODUODENOSCOPY N/A 09/01/2013   Procedure: ESOPHAGOGASTRODUODENOSCOPY (EGD);  Surgeon: Saralyn Pilar  Renee Ramus, MD;  Location: [place];  Service: Endoscopy;  Laterality: N/A;  bedside   LEFT HEART CATH AND CORONARY ANGIOGRAPHY N/A 11/19/2020   Procedure: LEFT HEART CATH AND CORONARY ANGIOGRAPHY;  Surgeon: Nelva Bush, MD;  Location: [place] [facility] LAB;  Service: Cardiovascular;  Laterality: N/A;   LEFT HEART CATHETERIZATION WITH CORONARY ANGIOGRAM N/A 08/30/2013   Procedure: LEFT HEART CATHETERIZATION WITH CORONARY ANGIOGRAM;  Surgeon: Leonie Man, MD;  Location: [facility] CATH LAB: 100% mRCA (thrombus - extends to RPAV), 80%  D1, 40% AVG Cx.   PERCUTANEOUS CORONARY STENT INTERVENTION (PCI-S)  08/30/2013   Procedure: PERCUTANEOUS CORONARY STENT INTERVENTION (PCI-S);  Surgeon: Leonie Man, MD;  Location: [facility] CATH LAB;  Integrity Resolute DES 2.0 mm x 38 mm -- 3.35 mm.; PTCA of proximal RPA V. - 3.0 mm x 15 mm balloon   PERIPHERAL VASCULAR ATHERECTOMY  05/26/2019   Procedure: PERIPHERAL VASCULAR ATHERECTOMY;  Surgeon: Marty Heck, MD;  Location: [place] CV LAB;  Service: Cardiovascular;;  Left SFA   PERIPHERAL VASCULAR INTERVENTION  05/26/2019   Procedure: PERIPHERAL VASCULAR INTERVENTION;  Surgeon: Marty Heck, MD;  Location: [place] CV LAB;  Service: Cardiovascular;;  Left SFA   PERIPHERAL VASCULAR INTERVENTION Right 11/15/2020   Procedure: PERIPHERAL VASCULAR INTERVENTION;  Surgeon: Marty Heck, MD;  Location: [place] CV LAB;  Service: Cardiovascular;  Laterality: Right;  Superficial Femoral Artery   PITUITARY SURGERY     TRANSTHORACIC ECHOCARDIOGRAM  08/30/2013   mild LVH. EF 50-55%. Moderate HK of the entire inferior myocardium. GR 1 DD. Mild LA dilation. Mildly reduced RV function   TYMPANOMASTOIDECTOMY Right 12/28/2017   Procedure: RIGHT TYMPANOMASTOIDECTOMY;  Surgeon: Leta Baptist, MD;  Location: [place];  Service: ENT;  Laterality: Right;    There were no vitals filed for this visit.   Subjective Assessment - 01/14/21 0846     Subjective No pain this AM.  Still had some difficulty standing over the weekend, back pain creeping up.    Currently in Pain? No/denies             Skilled therapy interventions:   Therapeutic Exercise:  Exercises Nustep 5 min L6 UE and LE  Supine Lower Trunk Rotation - sets - 10 reps - 5 sec hold Supine Bridge - 10 reps - 5 hold, partial  Supine Active Straight Leg Raise -  1 sets - 10 reps Modified Thomas Stretch - 1 reps - 30 hold   Sit to Stand Without Arm Support - 1 sets - 10 reps Standing wall squats x 10  knee pain  Step ups 6 inch 2 x 10 , limited by knee pain  Hip abduction x 10  Calf stretch 30 sec x 3  mini squats x 10 knee pain  LAQ , 4 inch x 15  Sidelying clam x 15 and hip abduction x 2 x 5    Self care: Avoid pushing through sharp pain, heat for Rt hip, back         PT Short Term Goals - 01/07/21 0913       PT SHORT TERM GOAL #1   Title Pt will be I with HEP for general strength and conditioning    Status Achieved      PT SHORT TERM GOAL #2   Title Pt will report less Rt hip and back pain with standing tasks in her home, avg 5/10    Baseline 3/10 today with short periods , improving  Status Achieved      PT SHORT TERM GOAL #3   Title Pt will be able to walk > 250 feet for 2 min walk test due to improved endurance    Baseline 302 feet    Status Achieved               PT Long Term Goals - 01/14/21 0848       PT LONG TERM GOAL #1   Title pt will be I with HEP upon discharge for more comprehensive program for back and hip    Status On-going      PT LONG TERM GOAL #2   Title Pt will be able to stand without UEs x 5 in < 30 sec    Status On-going      PT LONG TERM GOAL #3   Title Pt will be able to walk in the community with improved gait, limp for 500 feet and no device.    Status On-going      PT LONG TERM GOAL #4   Title Pt will be able to get on and off the mat table without increased pain    Status On-going                   Plan - 01/14/21 0847     Clinical Impression Statement Pt doing well overall, limited by knee pain with closed chain exercises.  She will cont to benefit from skilled PT to improve her ability to stand, walk and transfers with ease.  Cont POC.    PT Treatment/Interventions ADLs/Self Care Home Management;Gait training;Therapeutic exercise;Patient/family education;Manual techniques;Passive range of motion;Balance training;Moist Heat;Therapeutic activities;Functional mobility training;Cryotherapy;Electrical  Stimulation    PT Next Visit Plan Cont general strength and flexibility, endurance and gen back/hip pain. consdier knee pain as well    PT Home Exercise Plan Access Code: [email or web address]    Consulted and Agree with Plan of Care Patient             Patient will benefit from skilled therapeutic intervention in order to improve the following deficits and impairments:  Cardiopulmonary status limiting activity, Decreased activity tolerance, Decreased strength, Increased fascial restricitons, Impaired flexibility, Pain, Postural dysfunction, Obesity, Increased edema, Decreased range of motion, Decreased endurance, Decreased mobility, Difficulty walking, Abnormal gait  Visit Diagnosis: Muscle weakness (generalized)  Other symptoms and signs involving the musculoskeletal system  Chronic left-sided low back pain with sciatica, sciatica laterality unspecified  Localized edema     Problem List Patient Active Problem List   Diagnosis Date Noted   Fatty liver 06/19/2020   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 ([place]) 32/35/5732   Atherosclerotic heart disease of native coronary artery without angina pectoris 03/20/2020   Epigastric pain 03/20/2020   Nausea and vomiting 03/20/2020   Absence of toe ([place]) 06/28/2019   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   PAD (peripheral artery disease) ([place]) 05/26/2019   Osteomyelitis of left foot ([place]) 05/24/2019   Osteomyelitis ([place]) 05/23/2019   Cellulitis and abscess of foot, except toes 05/18/2019   Chronic osteomyelitis of ankle and foot ([place]) 05/18/2019   Cholesteatoma 02/24/2019   Cellulitis and abscess of toe 11/09/2018   Colon cancer screening 11/30/2017   Long term (current) use of insulin ([place]) 03/05/2017   Iron deficiency anemia 12/10/2014   Obesity (BMI 30-39.9) 11/17/2013   Diabetes mellitus type 2 in obese [facility])    Essential hypertension    Right thigh pain 11/01/2013   PVC's (premature  ventricular  contractions) 11/01/2013   Status post insertion of drug eluting coronary artery stent to [facility] emergently and PTCA to prox. PLA 09/16/2013   Hyperlipidemia associated with type 2 diabetes mellitus ([place]) 09/16/2013   Presence of coronary angioplasty implant and graft 09/08/2013   Panhypopituitarism ([place]) 08/30/2013   Non-ST elevation (NSTEMI) myocardial infarction [facility]) 08/22/2013   CAD S/P percutaneous coronary angioplasty 08/22/2013    Malia Corsi, PT 01/14/2021, 9:28 AM  [place], [place], [address] Phone: [phone]   Fax:  [phone]  Name: Tammy Good MRN: [id number] Date of Birth: Jan 07, 1951   Raeford Razor, PT 01/14/21 9:28 AM Phone: [phone] Fax: [phone]

## 2021-01-24 NOTE — Progress Notes (Signed)
Cardiology Office Note   Date:  01/25/2021   ID:  Tammy Good, DOB 07/31/1950, MRN [id number]  PCP:  [name], MD  Cardiologist: Dr. Ellyn Hack No chief complaint on file.    History of Present Illness: Tammy Good is a 70 y.o. female who presents for ongoing assessment and management of CAD with history of STEMI, with PCI to the right coronary artery in 2015.  The patient was hospitalized in 11/15/2020 for critical limb ischemia and right SFA stenting.  Postoperatively she had hypotension with a newly reduced ejection fraction and elevated troponin concerning for non-STEMI.  Cardiac catheterization completed on 11/19/2020 revealed severe two-vessel CAD including a 95% proximal left circumflex stenosis and multifocal diagonal 1 disease of up to 90%.  Her RCA stent was patent.  She subsequently had a successful PCI to the proximal left circumflex using Onyx Frontier 2.5 x 12 mm DES with 0% residual stenosis and TIMI-3 flow.  She was noted to have atrial fibrillation during admission.  It was preferable to have Plavix and Eliquis, but discussion was had with vascular surgery concerning avoidance of triple therapy if able.  She was transitioned to metoprolol succinate 25 mg daily and high intensity statin with SGLT2 inhibitor added.  Patient was also noted to have hypothyroidism with iatrogenic hyperthyroidism.  Her Synthroid medication was adjusted as it was being dosed too high at that time. During hospitalization in the setting of anemia she did have blood transfusions.  Was followed closely by Vein and Vascular service and remained on metoprolol Eliquis, Plavix losartan and Aldactone on discharge.  She was last seen in the office on 12/21/2020 by myself at which time she was feeling well despite her complicated hospitalization.  She requests a cardiac rehabilitation.  She had some continued soreness status post right SFA intervention.  No testing or further evaluation of coronary disease  was planned at that visit.  On review of notes she is undergoing physical therapy with last documented appointment on 01/14/2021.   She is here today in great spirits.  She is thrilled with her wound healing on the lower right pretibial area since having had the SFA stenting.  She continues to work with physical therapy and has gained some strength she states that her back feels much better, she has less pain, she also states that her hip is less sore now.  She is excited about her progress.  Past Medical History:  Diagnosis Date   CAD S/P percutaneous coronary angioplasty 08/2013   100% mRCA - PCI Integrity Resolute DES 3.0 mm x 38 mm - 3.35 mm; PTCA of RPA V 2.0 mm x 15 mm   Cholesteatoma    right   Diabetes mellitus type 2 in obese (HCC)    On insulin and Invokana   History of osteomyelitis L 5th Toe all 05/2019   s/p Partial Ray Amputation with partial closure; 6 wks Abx & LSFA Atherectomy/DEB PTA with Stent for focal dissection.   Hyperlipidemia with target LDL less than 70    Hypothyroidism (acquired)    Mild essential hypertension    Obesity (BMI 30-39.9) 11/17/2013   PAD (peripheral artery disease) ([place]) 05/26/2019   05/26/19: Abd AoGram- BLE runoff -> L SFA orbital atherectomy - PTA w/ DEB & Stent 6 x 40 Luttonix (for focal dissection) - patent Pop A with 3 V runoff. LEA Dopplers 01/03/2020: RABI (prev) 0.68 (0.53)/ RTBI (prev) 0.58 (0.33); LABI (prev) 0.80 (0.64), LTBI (prev) 0.64 (0.51); R mSFA ~50-74%, L  mSFA 50-74%. Patent Prox SFA stent < 49% stenosis   Panhypopituitarism (HCC)    ST elevation myocardial infarction (STEMI) of inferior wall, subsequent episode of care ([place]) 08/2013   80% branch of D1, 40% mid AV groove circumflex, 100% RCA with subacute thrombus -- thrombus extending into RPA V with 100% occlusion after initial angioplasty of mid RCA ;; Post MI ECHO 6/9/'15: EF 50-55%, mild LVH with moderate HK of inferior wall, Gr1 DD, mild LA dilation; mildly reduced RV function     Past Surgical History:  Procedure Laterality Date   ABDOMINAL AORTOGRAM W/LOWER EXTREMITY N/A 05/26/2019   Procedure: ABDOMINAL AORTOGRAM W/LOWER EXTREMITY;  Surgeon: Marty Heck, MD;  Location: [place] [facility];  Service: Cardiovascular;  Laterality: N/A;   ABDOMINAL AORTOGRAM W/LOWER EXTREMITY N/A 11/15/2020   Procedure: ABDOMINAL AORTOGRAM W/LOWER EXTREMITY;  Surgeon: Marty Heck, MD;  Location: [place] [facility];  Service: Cardiovascular;  Laterality: N/A;   AMPUTATION Left 05/25/2019   Procedure: AMPUTATION RAY 5th;  Surgeon: Trula Slade, DPM;  Location: [facility];  Service: Podiatry;  Laterality: Left;   Cardiac Event Monitor  July-August 2015   Sinus rhythm with PVCs   COLONOSCOPY N/A 08/31/2013   Procedure: COLONOSCOPY;  Surgeon: Juanita Craver, MD;  Location: [facility] ENDOSCOPY;  Service: Endoscopy;  Laterality: N/A;   CORONARY STENT INTERVENTION N/A 11/19/2020   Procedure: CORONARY STENT INTERVENTION;  Surgeon: Nelva Bush, MD;  Location: [place] [facility];  Service: Cardiovascular;  Laterality: N/A;   ESOPHAGOGASTRODUODENOSCOPY N/A 09/01/2013   Procedure: ESOPHAGOGASTRODUODENOSCOPY (EGD);  Surgeon: Beryle Beams, MD;  Location: [facility] ENDOSCOPY;  Service: Endoscopy;  Laterality: N/A;  bedside   LEFT HEART CATH AND CORONARY ANGIOGRAPHY N/A 11/19/2020   Procedure: LEFT HEART CATH AND CORONARY ANGIOGRAPHY;  Surgeon: Nelva Bush, MD;  Location: [place] [facility];  Service: Cardiovascular;  Laterality: N/A;   LEFT HEART CATHETERIZATION WITH CORONARY ANGIOGRAM N/A 08/30/2013   Procedure: LEFT HEART CATHETERIZATION WITH CORONARY ANGIOGRAM;  Surgeon: Leonie Man, MD;  Location: [facility] CATH LAB: 100% mRCA (thrombus - extends to RPAV), 80% D1, 40% AVG Cx.   PERCUTANEOUS CORONARY STENT INTERVENTION (PCI-S)  08/30/2013   Procedure: PERCUTANEOUS CORONARY STENT INTERVENTION (PCI-S);  Surgeon: Leonie Man, MD;  Location: [facility] CATH LAB;  Integrity Resolute DES 2.0 mm x 38  mm -- 3.35 mm.; PTCA of proximal RPA V. - 3.0 mm x 15 mm balloon   PERIPHERAL VASCULAR ATHERECTOMY  05/26/2019   Procedure: PERIPHERAL VASCULAR ATHERECTOMY;  Surgeon: Marty Heck, MD;  Location: [place] [facility];  Service: Cardiovascular;;  Left SFA   PERIPHERAL VASCULAR INTERVENTION  05/26/2019   Procedure: PERIPHERAL VASCULAR INTERVENTION;  Surgeon: Marty Heck, MD;  Location: [place] [facility];  Service: Cardiovascular;;  Left SFA   PERIPHERAL VASCULAR INTERVENTION Right 11/15/2020   Procedure: PERIPHERAL VASCULAR INTERVENTION;  Surgeon: Marty Heck, MD;  Location: [place] [facility];  Service: Cardiovascular;  Laterality: Right;  Superficial Femoral Artery   PITUITARY SURGERY     TRANSTHORACIC ECHOCARDIOGRAM  08/30/2013   mild LVH. EF 50-55%. Moderate HK of the entire inferior myocardium. GR 1 DD. Mild LA dilation. Mildly reduced RV function   TYMPANOMASTOIDECTOMY Right 12/28/2017   Procedure: RIGHT TYMPANOMASTOIDECTOMY;  Surgeon: Leta Baptist, MD;  Location: [facility];  Service: ENT;  Laterality: Right;     Current Outpatient Medications  Medication Sig Dispense Refill   apixaban (ELIQUIS) 5 MG TABS tablet Take 1 tablet (5 mg  total) by mouth 2 (two) times daily. 60 tablet 6   atorvastatin (LIPITOR) 40 MG tablet Take 1 tablet (40 mg total) by mouth daily at 6 PM. 30 tablet 0   B-D UF III MINI PEN NEEDLES 31G X 5 MM MISC Inject into the skin.     clopidogrel (PLAVIX) 75 MG tablet Take 1 tablet by mouth once daily with breakfast 90 tablet 0   Continuous Blood Gluc Sensor (FREESTYLE LIBRE 2 SENSOR) MISC Apply topically every 14 (fourteen) days.     dexamethasone (DECADRON) 0.5 MG tablet Take 0.5 mg by mouth daily.     FARXIGA 10 MG TABS tablet Take 10 mg by mouth daily.      ferrous sulfate 325 (65 FE) MG tablet Take 325 mg by mouth daily with breakfast.     Insulin Glargine (BASAGLAR KWIKPEN) 100 UNIT/ML SOPN Inject 48 Units into the skin at  bedtime.     levothyroxine (SYNTHROID) 125 MCG tablet Take 1 tablet (125 mcg total) by mouth daily before breakfast. 30 tablet 2   losartan (COZAAR) 25 MG tablet Take 1 tablet (25 mg total) by mouth daily. 30 tablet 11   metoprolol succinate (TOPROL-XL) 25 MG 24 hr tablet Take 1 tablet (25 mg total) by mouth in the morning and at bedtime. 180 tablet 0   nitroGLYCERIN (NITROSTAT) 0.4 MG SL tablet Place 1 tablet (0.4 mg total) under the tongue every 5 (five) minutes as needed for chest pain (up to 3 doses. If taking 3rd dose call 911). 25 tablet 3   NOVOLOG FLEXPEN 100 UNIT/ML FlexPen Inject 5-15 Units into the skin 3 (three) times daily with meals. Per sliding scale     nystatin cream (MYCOSTATIN) Apply 1 application topically 2 (two) times daily. (Patient taking differently: Apply 1 application topically 2 (two) times daily as needed (rash).) 30 g 1   Omega-3 Fatty Acids (FISH OIL) 1000 MG CAPS Take 1,000 mg by mouth daily.     ONETOUCH VERIO test strip   1   pantoprazole (PROTONIX) 40 MG tablet Take 1 tablet (40 mg total) by mouth 2 (two) times daily before a meal. 60 tablet 0   Propylene Glycol (SYSTANE COMPLETE) 0.6 % SOLN Place 1 drop into both eyes daily as needed (dry eyes).     silver sulfADIAZINE (SILVADENE) 1 % cream Apply 1 application topically daily. Apply thin layer to leg wound daily 50 g 0   spironolactone (ALDACTONE) 25 MG tablet Take 1 tablet (25 mg total) by mouth daily. 30 tablet 11   No current facility-administered medications for this visit.    Allergies:   Strawberry extract    Social History:  The patient  reports that she has quit smoking. She has never used smokeless tobacco. She reports that she does not drink alcohol and does not use drugs.   Family History:  The patient's family history includes Alzheimer's disease in her maternal grandmother; Cancer in her sister; Cancer (age of onset: 21) in her mother; Heart attack (age of onset: 56) in her father.    ROS: All  other systems are reviewed and negative. Unless otherwise mentioned in H&P    PHYSICAL EXAM: VS:  BP 138/62   Pulse (!) 59   Ht 5\' 7"  (1.702 m)   Wt 228 lb 6.4 oz (103.6 kg)   SpO2 96%   BMI 35.77 kg/m  , BMI Body mass index is 35.77 kg/m. GEN: Well nourished, well developed, in no acute distress HEENT: normal Neck:  no JVD, carotid bruits, or masses Cardiac: RRR; soft systolic murmur heard best at the right sternal border, murmurs, rubs, or gallops,no edema  Respiratory:  Clear to auscultation bilaterally, normal work of breathing GI: soft, nontender, nondistended, + BS MS: no deformity or atrophy, well-healed wound with some keloid skin thickening right lower pretibial area with some eschar in the center.  Nontender. Skin: warm and dry, no rash Neuro:  Strength and sensation are intact Psych: euthymic mood, full affect   EKG:  EKG is not ordered today.    Recent Labs: 11/16/2020: B Natriuretic Peptide 217.2; Magnesium 2.0; TSH <0.010 11/23/2020: ALT 16 12/21/2020: BUN 18; Creatinine, Ser 0.81; Hemoglobin 12.0; Platelets 381; Potassium 4.6; Sodium 141    Lipid Panel    Component Value Date/Time   CHOL 87 11/19/2020 0152   TRIG 85 11/19/2020 0152   HDL 24 (L) 11/19/2020 0152   CHOLHDL 3.6 11/19/2020 0152   VLDL 17 11/19/2020 0152   LDLCALC 46 11/19/2020 0152      Wt Readings from Last 3 Encounters:  01/25/21 228 lb 6.4 oz (103.6 kg)  01/01/21 224 lb 9.6 oz (101.9 kg)  12/21/20 228 lb (103.4 kg)      Other studies Reviewed: Left Heart Cath 11/19/2020.  [place], mass: Conclusions: Severe two-vessel coronary artery disease including 95% proximal LCx stenosis and multifocal D1 disease of up to 90%. Mild to moderate LAD and RCA disease. Patent RCA stent. Mildly elevated left ventricular filling pressure (LVEDP ~20 mmHg). Challenging but successful PCI to proximal LCx using Onyx Frontier 2.5 x 12 mm DES with 0% residual stenosis and TIMI-3 flow.    Recommendations: Restart IV heparin 2 hours after TR band removal. If no evidence of bleeding or vascular injury, consider transitioning to clopidogrel + DOAC as soon as tomorrow at the discretion of the rounding cardiology team given h/o PAF this admission. Aggressive secondary prevention. Medical management of diagonal disease, as the diseased portions of the vessel are too small for PCI.   Nelva Bush, MD   ASSESSMENT AND PLAN:  1.  Coronary artery disease: Severe two-vessel CAD per cardiac catheterization 2022, with 95% proximal left circumflex and 90% diagonal 1.  She continues on secondary prevention and clopidogrel.  She is not on aspirin as she is also on Eliquis.  She denies any chest discomfort or palpitations.  No changes in her regimen.  2.  PAD: Status post right SFA stenting for critical limb ischemia.  She is thrilled with the results of the revascularization.  Wound on the right pretibial area is healing well with some keloid scarring and mild eschar in the middle.  She states it is been healing more rapidly and is not causing her any pain.  She continues with physical therapy.  3.  Hypothyroidism: Followed by PCP  4.  Type 2 diabetes: On insulin and Invokana with labs followed by PCP.  5.  Hyperlipidemia: Continue on statin therapy with goal of LDL less than 70.  6.  Hypertension: Currently well controlled.  No changes in medication regimen   Current medicines are reviewed at length with the patient today.  I have spent 25 minutes dedicated to the care of this patient on the date of this encounter to include pre-visit review of records, assessment, management and diagnostic testing,with shared decision making.   Labs/ tests ordered today include: None  Phill Myron. [place] Pugh, ANP, AACC   01/25/2021 11:09 AM    [facility] [address]  Office [phone] Fax [phone]  Notice: This dictation was prepared with Dragon  dictation along with smaller phrase technology. Any transcriptional errors that result from this process are unintentional and may not be corrected upon review.

## 2021-01-25 ENCOUNTER — Encounter: Payer: Self-pay | Admitting: Adult Health

## 2021-01-25 ENCOUNTER — Ambulatory Visit (INDEPENDENT_AMBULATORY_CARE_PROVIDER_SITE_OTHER): Payer: Medicare Other | Admitting: Adult Health

## 2021-01-25 ENCOUNTER — Other Ambulatory Visit: Payer: Self-pay

## 2021-01-25 VITALS — BP 138/62 | HR 59 | Ht 67.0 in | Wt 228.4 lb

## 2021-01-25 DIAGNOSIS — I358 Other nonrheumatic aortic valve disorders: Secondary | ICD-10-CM | POA: Diagnosis not present

## 2021-01-25 DIAGNOSIS — I2510 Atherosclerotic heart disease of native coronary artery without angina pectoris: Secondary | ICD-10-CM

## 2021-01-25 DIAGNOSIS — I10 Essential (primary) hypertension: Secondary | ICD-10-CM | POA: Diagnosis not present

## 2021-01-25 DIAGNOSIS — Z9861 Coronary angioplasty status: Secondary | ICD-10-CM | POA: Diagnosis not present

## 2021-01-25 DIAGNOSIS — E7800 Pure hypercholesterolemia, unspecified: Secondary | ICD-10-CM

## 2021-01-25 DIAGNOSIS — E669 Obesity, unspecified: Secondary | ICD-10-CM

## 2021-01-25 DIAGNOSIS — E1169 Type 2 diabetes mellitus with other specified complication: Secondary | ICD-10-CM

## 2021-01-25 NOTE — Patient Instructions (Signed)
Medication Instructions:  No Change *If you need a refill on your cardiac medications before your next appointment, please call your pharmacy*   Lab Work: None Ordered   Testing/Procedures: None Ordered   Follow-Up: At [facility], you and your health needs are our priority.  As part of our continuing mission to provide you with exceptional heart care, we have created designated Provider Care Teams.  These Care Teams include your primary Cardiologist (physician) and Advanced Practice Providers (APPs -  Physician Assistants and Nurse Practitioners) who all work together to provide you with the care you need, when you need it.  We recommend signing up for the patient portal called "MyChart".  Sign up information is provided on this After Visit Summary.  MyChart is used to connect with patients for Virtual Visits (Telemedicine).  Patients are able to view lab/test results, encounter notes, upcoming appointments, etc.  Non-urgent messages can be sent to your provider as well.   To learn more about what you can do with MyChart, go to [email or web address].    Your next appointment:   6 month(s)  The format for your next appointment:   In Person  Provider:   Glenetta Hew, MD 1}

## 2021-01-28 ENCOUNTER — Encounter: Payer: Self-pay | Admitting: Physical Therapy

## 2021-01-28 ENCOUNTER — Other Ambulatory Visit: Payer: Self-pay

## 2021-01-28 ENCOUNTER — Ambulatory Visit: Payer: Medicare Other | Attending: Endocrinology | Admitting: Physical Therapy

## 2021-01-28 DIAGNOSIS — M5440 Lumbago with sciatica, unspecified side: Secondary | ICD-10-CM | POA: Diagnosis present

## 2021-01-28 DIAGNOSIS — M6281 Muscle weakness (generalized): Secondary | ICD-10-CM | POA: Insufficient documentation

## 2021-01-28 DIAGNOSIS — R29898 Other symptoms and signs involving the musculoskeletal system: Secondary | ICD-10-CM | POA: Insufficient documentation

## 2021-01-28 DIAGNOSIS — G8929 Other chronic pain: Secondary | ICD-10-CM | POA: Insufficient documentation

## 2021-01-28 DIAGNOSIS — R600 Localized edema: Secondary | ICD-10-CM | POA: Diagnosis present

## 2021-01-28 NOTE — Therapy (Signed)
[place], [place], [address] Phone: [phone]   Fax:  [phone]  Physical Therapy Treatment  Patient Details  Name: Tammy Good MRN: [id number] Date of Birth: 1950-04-29 Referring Provider (PT): Dr. Monica Martinez (Dr Sharol Given for wound)   Encounter Date: 01/28/2021   PT End of Session - 01/28/21 0805     Visit Number 5    Number of Visits 8    Date for PT Re-Evaluation 02/11/21    Authorization Type MCR, BCBS    PT Start Time 0800    PT Stop Time 0835    PT Time Calculation (min) 35 min             Past Medical History:  Diagnosis Date   CAD S/P percutaneous coronary angioplasty 08/2013   100% mRCA - PCI Integrity Resolute DES 3.0 mm x 38 mm - 3.35 mm; PTCA of RPA V 2.0 mm x 15 mm   Cholesteatoma    right   Diabetes mellitus type 2 in obese (HCC)    On insulin and Invokana   History of osteomyelitis L 5th Toe all 05/2019   s/p Partial Ray Amputation with partial closure; 6 wks Abx & LSFA Atherectomy/DEB PTA with Stent for focal dissection.   Hyperlipidemia with target LDL less than 70    Hypothyroidism (acquired)    Mild essential hypertension    Obesity (BMI 30-39.9) 11/17/2013   PAD (peripheral artery disease) ([place]) 05/26/2019   05/26/19: Abd AoGram- BLE runoff -> L SFA orbital atherectomy - PTA w/ DEB & Stent 6 x 40 Luttonix (for focal dissection) - patent Pop A with 3 V runoff. LEA Dopplers 01/03/2020: RABI (prev) 0.68 (0.53)/ RTBI (prev) 0.58 (0.33); LABI (prev) 0.80 (0.64), LTBI (prev) 0.64 (0.51); R mSFA ~50-74%, L mSFA 50-74%. Patent Prox SFA stent < 49% stenosis   Panhypopituitarism (HCC)    ST elevation myocardial infarction (STEMI) of inferior wall, subsequent episode of care ([place]) 08/2013   80% branch of D1, 40% mid AV groove circumflex, 100% RCA with subacute thrombus -- thrombus extending into RPA V with 100% occlusion after initial angioplasty of mid RCA ;; Post MI ECHO  6/9/'15: EF 50-55%, mild LVH with moderate HK of inferior wall, Gr1 DD, mild LA dilation; mildly reduced RV function    Past Surgical History:  Procedure Laterality Date   ABDOMINAL AORTOGRAM W/LOWER EXTREMITY N/A 05/26/2019   Procedure: ABDOMINAL AORTOGRAM W/LOWER EXTREMITY;  Surgeon: Marty Heck, MD;  Location: [place] [facility];  Service: Cardiovascular;  Laterality: N/A;   ABDOMINAL AORTOGRAM W/LOWER EXTREMITY N/A 11/15/2020   Procedure: ABDOMINAL AORTOGRAM W/LOWER EXTREMITY;  Surgeon: Marty Heck, MD;  Location: [place] [facility];  Service: Cardiovascular;  Laterality: N/A;   AMPUTATION Left 05/25/2019   Procedure: AMPUTATION RAY 5th;  Surgeon: Trula Slade, DPM;  Location: [place];  Service: Podiatry;  Laterality: Left;   Cardiac Event Monitor  July-August 2015   Sinus rhythm with PVCs   COLONOSCOPY N/A 08/31/2013   Procedure: COLONOSCOPY;  Surgeon: Juanita Craver, MD;  Location: [facility] ENDOSCOPY;  Service: Endoscopy;  Laterality: N/A;   CORONARY STENT INTERVENTION N/A 11/19/2020   Procedure: CORONARY STENT INTERVENTION;  Surgeon: Nelva Bush, MD;  Location: [place] [facility];  Service: Cardiovascular;  Laterality: N/A;   ESOPHAGOGASTRODUODENOSCOPY N/A 09/01/2013   Procedure: ESOPHAGOGASTRODUODENOSCOPY (EGD);  Surgeon: Beryle Beams, MD;  Location: [facility] ENDOSCOPY;  Service: Endoscopy;  Laterality: N/A;  bedside   LEFT HEART  CATH AND CORONARY ANGIOGRAPHY N/A 11/19/2020   Procedure: LEFT HEART CATH AND CORONARY ANGIOGRAPHY;  Surgeon: Nelva Bush, MD;  Location: [place] [facility];  Service: Cardiovascular;  Laterality: N/A;   LEFT HEART CATHETERIZATION WITH CORONARY ANGIOGRAM N/A 08/30/2013   Procedure: LEFT HEART CATHETERIZATION WITH CORONARY ANGIOGRAM;  Surgeon: Leonie Man, MD;  Location: [facility] CATH LAB: 100% mRCA (thrombus - extends to RPAV), 80% D1, 40% AVG Cx.   PERCUTANEOUS CORONARY STENT INTERVENTION (PCI-S)  08/30/2013   Procedure: PERCUTANEOUS CORONARY  STENT INTERVENTION (PCI-S);  Surgeon: Leonie Man, MD;  Location: [facility] CATH LAB;  Integrity Resolute DES 2.0 mm x 38 mm -- 3.35 mm.; PTCA of proximal RPA V. - 3.0 mm x 15 mm balloon   PERIPHERAL VASCULAR ATHERECTOMY  05/26/2019   Procedure: PERIPHERAL VASCULAR ATHERECTOMY;  Surgeon: Marty Heck, MD;  Location: [place] [facility];  Service: Cardiovascular;;  Left SFA   PERIPHERAL VASCULAR INTERVENTION  05/26/2019   Procedure: PERIPHERAL VASCULAR INTERVENTION;  Surgeon: Marty Heck, MD;  Location: [place] [facility];  Service: Cardiovascular;;  Left SFA   PERIPHERAL VASCULAR INTERVENTION Right 11/15/2020   Procedure: PERIPHERAL VASCULAR INTERVENTION;  Surgeon: Marty Heck, MD;  Location: [place] [facility];  Service: Cardiovascular;  Laterality: Right;  Superficial Femoral Artery   PITUITARY SURGERY     TRANSTHORACIC ECHOCARDIOGRAM  08/30/2013   mild LVH. EF 50-55%. Moderate HK of the entire inferior myocardium. GR 1 DD. Mild LA dilation. Mildly reduced RV function   TYMPANOMASTOIDECTOMY Right 12/28/2017   Procedure: RIGHT TYMPANOMASTOIDECTOMY;  Surgeon: Leta Baptist, MD;  Location: [place];  Service: ENT;  Laterality: Right;    There were no vitals filed for this visit.   Subjective Assessment - 01/28/21 0802     Subjective MIn pain in Rt hip, leg today.  Has some irritation due to Miralax. Overall feeling better, stronger    Currently in Pain? Yes    Pain Score 1     Pain Location Leg    Pain Orientation Right;Lateral    Pain Descriptors / Indicators Sore    Pain Type Chronic pain    Pain Onset More than a month ago    Pain Frequency Intermittent    Aggravating Factors  depends on the day    Pain Relieving Factors rest, meds              [facility] Adult PT Treatment/Exercise:   Therapeutic Exercise: Standing  Calf raise x 20  Mini squat x 15  High knee march x 20  Step ups 4 inch x 15 each  Hip abduction x 10, foot on foam pad Sink  stretching for low back release  Piriformis stretch sitting x 3 Sit to stand x 10 Sidelying clam x 15  Hip abduction x 10 very challenging     Manual Therapy:   Neuromuscular re-ed: N/A   Therapeutic Activity: N/A   Modalities: N/A   Self Care: HEP    Consider / progression for next session:           PT Short Term Goals - 01/07/21 0913       PT SHORT TERM GOAL #1   Title Pt will be I with HEP for general strength and conditioning    Status Achieved      PT SHORT TERM GOAL #2   Title Pt will report less Rt hip and back pain with standing tasks in her home, avg 5/10  Baseline 3/10 today with short periods , improving    Status Achieved      PT SHORT TERM GOAL #3   Title Pt will be able to walk > 250 feet for 2 min walk test due to improved endurance    Baseline 302 feet    Status Achieved               PT Long Term Goals - 01/28/21 0818       PT LONG TERM GOAL #1   Title pt will be I with HEP upon discharge for more comprehensive program for back and hip    Status On-going      PT LONG TERM GOAL #2   Title Pt will be able to stand without UEs x 5 in < 30 sec    Baseline 19 sec with min UEs    Status Partially Met      PT LONG TERM GOAL #3   Title Pt will be able to walk in the community with improved gait, limp for 500 feet and no device.      PT LONG TERM GOAL #4   Title Pt will be able to get on and off the mat table without increased pain    Status Achieved                   Plan - 01/28/21 0823     Clinical Impression Statement Patient continues to benefit from strengthening exercises to improve her functional mobility. Weakness in hip abductors, glute medius. She requested a shorter session due to discomfort from frequent toileting/hygiene.  Cont POC and reinforced HEP this week, including walking outside.    PT Treatment/Interventions ADLs/Self Care Home Management;Gait training;Therapeutic exercise;Patient/family  education;Manual techniques;Passive range of motion;Balance training;Moist Heat;Therapeutic activities;Functional mobility training;Cryotherapy;Electrical Stimulation    PT Next Visit Plan Cont general strength and flexibility, endurance and gen back/hip pain. consdier knee pain as well, FOTO.    PT Home Exercise Plan Access Code: [email or web address]    Consulted and Agree with Plan of Care Patient             Patient will benefit from skilled therapeutic intervention in order to improve the following deficits and impairments:  Cardiopulmonary status limiting activity, Decreased activity tolerance, Decreased strength, Increased fascial restricitons, Impaired flexibility, Pain, Postural dysfunction, Obesity, Increased edema, Decreased range of motion, Decreased endurance, Decreased mobility, Difficulty walking, Abnormal gait  Visit Diagnosis: Muscle weakness (generalized)  Other symptoms and signs involving the musculoskeletal system  Chronic left-sided low back pain with sciatica, sciatica laterality unspecified  Localized edema     Problem List Patient Active Problem List   Diagnosis Date Noted   Fatty liver 06/19/2020   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 ([place]) 90/93/1121   Atherosclerotic heart disease of native coronary artery without angina pectoris 03/20/2020   Epigastric pain 03/20/2020   Nausea and vomiting 03/20/2020   Absence of toe ([place]) 06/28/2019   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   PAD (peripheral artery disease) ([place]) 05/26/2019   Osteomyelitis of left foot ([place]) 05/24/2019   Osteomyelitis ([place]) 05/23/2019   Cellulitis and abscess of foot, except toes 05/18/2019   Chronic osteomyelitis of ankle and foot ([place]) 05/18/2019   Cholesteatoma 02/24/2019   Cellulitis and abscess of toe 11/09/2018   Colon cancer screening 11/30/2017   Long term (current) use of insulin ([place]) 03/05/2017   Iron deficiency anemia 12/10/2014   Obesity (BMI  30-39.9) 11/17/2013  Diabetes mellitus type 2 in obese [facility])    Essential hypertension    Right thigh pain 11/01/2013   PVC's (premature ventricular contractions) 11/01/2013   Status post insertion of drug eluting coronary artery stent to [facility] emergently and PTCA to prox. PLA 09/16/2013   Hyperlipidemia associated with type 2 diabetes mellitus ([place]) 09/16/2013   Presence of coronary angioplasty implant and graft 09/08/2013   Panhypopituitarism ([place]) 08/30/2013   Non-ST elevation (NSTEMI) myocardial infarction [facility]) 08/22/2013   CAD S/P percutaneous coronary angioplasty 08/22/2013    Averill Winters, PT 01/28/2021, 8:37 AM  [place], [place], [address] Phone: [phone]   Fax:  [phone]  Name: LORELY BUBB MRN: [id number] Date of Birth: [birth year]   Raeford Razor, PT 01/28/21 8:37 AM Phone: [phone] Fax: [phone]

## 2021-02-04 ENCOUNTER — Ambulatory Visit: Payer: Medicare Other | Admitting: Physical Therapy

## 2021-02-11 ENCOUNTER — Ambulatory Visit: Payer: Medicare Other | Admitting: Physical Therapy

## 2021-02-11 ENCOUNTER — Other Ambulatory Visit: Payer: Self-pay

## 2021-02-11 ENCOUNTER — Encounter: Payer: Self-pay | Admitting: Physical Therapy

## 2021-02-11 DIAGNOSIS — G8929 Other chronic pain: Secondary | ICD-10-CM

## 2021-02-11 DIAGNOSIS — M6281 Muscle weakness (generalized): Secondary | ICD-10-CM | POA: Diagnosis not present

## 2021-02-11 DIAGNOSIS — R29898 Other symptoms and signs involving the musculoskeletal system: Secondary | ICD-10-CM

## 2021-02-11 DIAGNOSIS — M5440 Lumbago with sciatica, unspecified side: Secondary | ICD-10-CM

## 2021-02-11 DIAGNOSIS — R600 Localized edema: Secondary | ICD-10-CM

## 2021-02-11 NOTE — Therapy (Signed)
[place], [place], [address] Phone: [phone]   Fax:  [phone]  Physical Therapy Treatment/Discharge  Patient Details  Name: Tammy Good MRN: [id number] Date of Birth: 05/30/1950 Referring Provider (PT): Dr. Monica Martinez (Dr Sharol Given for wound)   Encounter Date: 02/11/2021   PT End of Session - 02/11/21 0901     Visit Number 6    Date for PT Re-Evaluation 02/11/21    Authorization Type MCR, BCBS    PT Start Time [phone]    PT Stop Time 0924    PT Time Calculation (min) 38 min    Activity Tolerance Patient tolerated treatment well    Behavior During Therapy [facility] for tasks assessed/performed             Past Medical History:  Diagnosis Date   CAD S/P percutaneous coronary angioplasty 08/2013   100% mRCA - PCI Integrity Resolute DES 3.0 mm x 38 mm - 3.35 mm; PTCA of RPA V 2.0 mm x 15 mm   Cholesteatoma    right   Diabetes mellitus type 2 in obese (HCC)    On insulin and Invokana   History of osteomyelitis L 5th Toe all 05/2019   s/p Partial Ray Amputation with partial closure; 6 wks Abx & LSFA Atherectomy/DEB PTA with Stent for focal dissection.   Hyperlipidemia with target LDL less than 70    Hypothyroidism (acquired)    Mild essential hypertension    Obesity (BMI 30-39.9) 11/17/2013   PAD (peripheral artery disease) ([place]) 05/26/2019   05/26/19: Abd AoGram- BLE runoff -> L SFA orbital atherectomy - PTA w/ DEB & Stent 6 x 40 Luttonix (for focal dissection) - patent Pop A with 3 V runoff. LEA Dopplers 01/03/2020: RABI (prev) 0.68 (0.53)/ RTBI (prev) 0.58 (0.33); LABI (prev) 0.80 (0.64), LTBI (prev) 0.64 (0.51); R mSFA ~50-74%, L mSFA 50-74%. Patent Prox SFA stent < 49% stenosis   Panhypopituitarism (HCC)    ST elevation myocardial infarction (STEMI) of inferior wall, subsequent episode of care ([place]) 08/2013   80% branch of D1, 40% mid AV groove circumflex, 100% RCA with subacute thrombus --  thrombus extending into RPA V with 100% occlusion after initial angioplasty of mid RCA ;; Post MI ECHO 6/9/'15: EF 50-55%, mild LVH with moderate HK of inferior wall, Gr1 DD, mild LA dilation; mildly reduced RV function    Past Surgical History:  Procedure Laterality Date   ABDOMINAL AORTOGRAM W/LOWER EXTREMITY N/A 05/26/2019   Procedure: ABDOMINAL AORTOGRAM W/LOWER EXTREMITY;  Surgeon: Marty Heck, MD;  Location: [place] [facility];  Service: Cardiovascular;  Laterality: N/A;   ABDOMINAL AORTOGRAM W/LOWER EXTREMITY N/A 11/15/2020   Procedure: ABDOMINAL AORTOGRAM W/LOWER EXTREMITY;  Surgeon: Marty Heck, MD;  Location: [place] [facility];  Service: Cardiovascular;  Laterality: N/A;   AMPUTATION Left 05/25/2019   Procedure: AMPUTATION RAY 5th;  Surgeon: Trula Slade, DPM;  Location: [place];  Service: Podiatry;  Laterality: Left;   Cardiac Event Monitor  July-August 2015   Sinus rhythm with PVCs   COLONOSCOPY N/A 08/31/2013   Procedure: COLONOSCOPY;  Surgeon: Juanita Craver, MD;  Location: [facility] ENDOSCOPY;  Service: Endoscopy;  Laterality: N/A;   CORONARY STENT INTERVENTION N/A 11/19/2020   Procedure: CORONARY STENT INTERVENTION;  Surgeon: Nelva Bush, MD;  Location: [place] [facility];  Service: Cardiovascular;  Laterality: N/A;   ESOPHAGOGASTRODUODENOSCOPY N/A 09/01/2013   Procedure: ESOPHAGOGASTRODUODENOSCOPY (EGD);  Surgeon: Beryle Beams, MD;  Location: [place];  Service: Endoscopy;  Laterality: N/A;  bedside   LEFT HEART CATH AND CORONARY ANGIOGRAPHY N/A 11/19/2020   Procedure: LEFT HEART CATH AND CORONARY ANGIOGRAPHY;  Surgeon: Nelva Bush, MD;  Location: [place] [facility];  Service: Cardiovascular;  Laterality: N/A;   LEFT HEART CATHETERIZATION WITH CORONARY ANGIOGRAM N/A 08/30/2013   Procedure: LEFT HEART CATHETERIZATION WITH CORONARY ANGIOGRAM;  Surgeon: Leonie Man, MD;  Location: [facility] CATH LAB: 100% mRCA (thrombus - extends to RPAV), 80% D1, 40% AVG  Cx.   PERCUTANEOUS CORONARY STENT INTERVENTION (PCI-S)  08/30/2013   Procedure: PERCUTANEOUS CORONARY STENT INTERVENTION (PCI-S);  Surgeon: Leonie Man, MD;  Location: [facility] CATH LAB;  Integrity Resolute DES 2.0 mm x 38 mm -- 3.35 mm.; PTCA of proximal RPA V. - 3.0 mm x 15 mm balloon   PERIPHERAL VASCULAR ATHERECTOMY  05/26/2019   Procedure: PERIPHERAL VASCULAR ATHERECTOMY;  Surgeon: Marty Heck, MD;  Location: [place] [facility];  Service: Cardiovascular;;  Left SFA   PERIPHERAL VASCULAR INTERVENTION  05/26/2019   Procedure: PERIPHERAL VASCULAR INTERVENTION;  Surgeon: Marty Heck, MD;  Location: [place] [facility];  Service: Cardiovascular;;  Left SFA   PERIPHERAL VASCULAR INTERVENTION Right 11/15/2020   Procedure: PERIPHERAL VASCULAR INTERVENTION;  Surgeon: Marty Heck, MD;  Location: [place] [facility];  Service: Cardiovascular;  Laterality: Right;  Superficial Femoral Artery   PITUITARY SURGERY     TRANSTHORACIC ECHOCARDIOGRAM  08/30/2013   mild LVH. EF 50-55%. Moderate HK of the entire inferior myocardium. GR 1 DD. Mild LA dilation. Mildly reduced RV function   TYMPANOMASTOIDECTOMY Right 12/28/2017   Procedure: RIGHT TYMPANOMASTOIDECTOMY;  Surgeon: Leta Baptist, MD;  Location: [place];  Service: ENT;  Laterality: Right;    There were no vitals filed for this visit.   Subjective Assessment - 02/11/21 0850     Subjective Pt feels better.  She does her exercises everyday.  This AM her back is hurting a little.  Had to stand for a while yesterday increased pain in back.  OK with discharge    Pain Score 6     Pain Location Back    Pain Orientation Right;Left;Lower    Pain Descriptors / Indicators Sore;Tightness    Pain Type Chronic pain    Pain Onset More than a month ago    Pain Frequency Intermittent    Aggravating Factors  varies    Pain Relieving Factors rest, meds, usually stretching                [facility] PT Assessment - 02/11/21 0001        Transfers   Five time sit to stand comments  18 sec no UE needed    Comments Rt knee discomfort      Ambulation/Gait   Gait Comments 2 min 324 feet             [facility] Adult PT Treatment/Exercise:   Therapeutic Exercise: 2 min walk test 324 feet   Patient performed her HEP as follows with min to mod cues overall  Knee to chest x 2 x 30 sec  Supine Lower Trunk Rotation - 2 sets - 10 reps - 10 hold Supine Bridge - 2 sets - 10 reps - 5 hold Supine Active Straight Leg Raise - - 2 sets - 10 reps - 5 hold Modified Thomas Stretch -- 1 sets - 3 reps - 30 hold Sit to Stand Without Arm Support -  - 1  sets - 10  reps - 5 hold Clamshell - - 2 sets - 10 reps - 5 hold Hip abduction x 1 set x 10    Verbal review: Sidelying ITB Stretch off Table -1 sets - 3 reps - 30 hold Standing Heel Raise with Support - - 2 sets - 10 reps - 5 hold Standing Gastroc Stretch -2  sets - 5 reps - 30 hold Walking program      Self Care: HEP, see education    Consider / progression for next session:        PT Education - 02/11/21 0859     Education Details [employer] , benefit of aquatics, silver sneakers    Person(s) Educated Patient    Methods Explanation;Verbal cues    Comprehension Verbalized understanding              PT Short Term Goals - 01/07/21 0913       PT SHORT TERM GOAL #1   Title Pt will be I with HEP for general strength and conditioning    Status Achieved      PT SHORT TERM GOAL #2   Title Pt will report less Rt hip and back pain with standing tasks in her home, avg 5/10    Baseline 3/10 today with short periods , improving    Status Achieved      PT SHORT TERM GOAL #3   Title Pt will be able to walk > 250 feet for 2 min walk test due to improved endurance    Baseline 302 feet    Status Achieved               PT Long Term Goals - 02/11/21 0903       PT LONG TERM GOAL #1   Title pt will be I with HEP upon discharge for more comprehensive program for  back and hip    Status Achieved      PT LONG TERM GOAL #2   Title Pt will be able to stand without UEs x 5 in < 30 sec    Status Achieved      PT LONG TERM GOAL #3   Title Pt will be able to walk in the community with improved gait, limp for 500 feet and no device.    Baseline challenged today with 2 min walk test , 320 feet  arms extended and abductted, min limp f    Status Partially Met      PT LONG TERM GOAL #4   Title Pt will be able to get on and off the mat table without increased pain    Status Achieved                   Plan - 02/11/21 0905     Clinical Impression Statement Pt has met her goals.  SHe continues to be limited in her ability to stand, walk for periods of time due to back discomfort.   She has improved overall strength and tolerance for exercise.  She does her HEP daily.  She plans to check out silver sneakers at the [facility] and possibly do water aerobics.  She cont to have weakness in lateral hip muscles which impact her abiltiy to walk without limping.  FOTO score    PT Treatment/Interventions ADLs/Self Care Home Management;Gait training;Therapeutic exercise;Patient/family education;Manual techniques;Passive range of motion;Balance training;Moist Heat;Therapeutic activities;Functional mobility training;Cryotherapy;Electrical Stimulation    PT Next Visit Plan DC from PT    PT Home Exercise Plan Access Code:  [email or web address]    Consulted and Agree with Plan of Care Patient             Patient will benefit from skilled therapeutic intervention in order to improve the following deficits and impairments:  Cardiopulmonary status limiting activity, Decreased activity tolerance, Decreased strength, Increased fascial restricitons, Impaired flexibility, Pain, Postural dysfunction, Obesity, Increased edema, Decreased range of motion, Decreased endurance, Decreased mobility, Difficulty walking, Abnormal gait  Visit Diagnosis: Muscle weakness (generalized)  Other symptoms  and signs involving the musculoskeletal system  Chronic left-sided low back pain with sciatica, sciatica laterality unspecified  Localized edema     Problem List Patient Active Problem List   Diagnosis Date Noted   Fatty liver 06/19/2020   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 ([place]) 27/05/5007   Atherosclerotic heart disease of native coronary artery without angina pectoris 03/20/2020   Epigastric pain 03/20/2020   Nausea and vomiting 03/20/2020   Absence of toe ([place]) 06/28/2019   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   PAD (peripheral artery disease) ([place]) 05/26/2019   Osteomyelitis of left foot ([place]) 05/24/2019   Osteomyelitis ([place]) 05/23/2019   Cellulitis and abscess of foot, except toes 05/18/2019   Chronic osteomyelitis of ankle and foot ([place]) 05/18/2019   Cholesteatoma 02/24/2019   Cellulitis and abscess of toe 11/09/2018   Colon cancer screening 11/30/2017   Long term (current) use of insulin ([place]) 03/05/2017   Iron deficiency anemia 12/10/2014   Obesity (BMI 30-39.9) 11/17/2013   Diabetes mellitus type 2 in obese [facility])    Essential hypertension    Right thigh pain 11/01/2013   PVC's (premature ventricular contractions) 11/01/2013   Status post insertion of drug eluting coronary artery stent to [facility] emergently and PTCA to prox. PLA 09/16/2013   Hyperlipidemia associated with type 2 diabetes mellitus ([place]) 09/16/2013   Presence of coronary angioplasty implant and graft 09/08/2013   Panhypopituitarism ([place]) 08/30/2013   Non-ST elevation (NSTEMI) myocardial infarction [facility]) 08/22/2013   CAD S/P percutaneous coronary angioplasty 08/22/2013    Tammy Good, PT 02/11/2021, 9:24 AM  [place], [place], [address] Phone: [phone]   Fax:  [phone]  Name: Tammy Good MRN: [id number] Date of Birth: [birth year]  PHYSICAL THERAPY DISCHARGE  SUMMARY  Visits from Start of Care: 6  Current functional level related to goals / functional outcomes: See above    Remaining deficits: Hip weakness, endurance    Education / Equipment: HEP, strengthening, back, posture, lifting    Patient agrees to discharge. Patient goals were met. Patient is being discharged due to meeting the stated rehab goals.   Raeford Razor, PT 02/11/21 9:24 AM Phone: [phone] Fax: [phone]

## 2021-03-04 ENCOUNTER — Other Ambulatory Visit: Payer: Self-pay | Admitting: Cardiology

## 2021-03-12 ENCOUNTER — Other Ambulatory Visit: Payer: Self-pay

## 2021-03-12 ENCOUNTER — Ambulatory Visit (INDEPENDENT_AMBULATORY_CARE_PROVIDER_SITE_OTHER): Payer: Medicare Other | Admitting: Podiatry

## 2021-03-12 DIAGNOSIS — E119 Type 2 diabetes mellitus without complications: Secondary | ICD-10-CM

## 2021-03-12 DIAGNOSIS — M79675 Pain in left toe(s): Secondary | ICD-10-CM | POA: Diagnosis not present

## 2021-03-12 DIAGNOSIS — B351 Tinea unguium: Secondary | ICD-10-CM

## 2021-03-12 DIAGNOSIS — M79674 Pain in right toe(s): Secondary | ICD-10-CM | POA: Diagnosis not present

## 2021-03-12 DIAGNOSIS — Q828 Other specified congenital malformations of skin: Secondary | ICD-10-CM | POA: Diagnosis not present

## 2021-03-12 NOTE — Progress Notes (Signed)
Subjective: 70 y.o. returns the office today for painful, elongated, thickened toenails which she cannot trim herself.  States that she has been getting a corn on the left fourth toe.  Denies any skin breakdown.  No open sores that she reports.  She has no new concerns today.  PCP: [name], MD  Objective: AAO 3, NAD DP/PT pulses palpable, CRT less than 3 seconds Sensation decreased with Semmes Weinstein monofilament. Nails hypertrophic, dystrophic, elongated, brittle, discolored 9. There is tenderness overlying the nails 1-5 bilaterally except for the left fifth toe which is been amputated.  Hyperkeratotic lesion left fourth toe on the PIPJ.  Hyperkeratotic lesion plantar aspect submetatarsal laterally.  No underlying ulceration drainage or any signs of infection to either lesion.  There is no drainage or pus or any signs of infection. No pain with calf compression, swelling, warmth, erythema.  Assessment: Patient presents with symptomatic onychomycosis, preulcerative callus  Plan: -Treatment options including alternatives, risks, complications were discussed -Nails sharply debrided 9 without complication/bleeding. -Hyperkeratotic lesion sharply debrided x2 without any complications or bleeding. -Continue diabetic shoes/offloading-offloading pad dispensed for the fourth toe left foot. -Discussed daily foot inspection. If there are any changes, to call the office immediately.  -Follow-up in 3 months or sooner if any problems are to arise. In the meantime, encouraged to call the office with any questions, concerns, changes symptoms.  Celesta Gentile, DPM

## 2021-03-12 NOTE — Patient Instructions (Signed)

## 2021-04-01 ENCOUNTER — Ambulatory Visit: Payer: Medicare PPO | Admitting: Orthopedic Surgery

## 2021-04-01 ENCOUNTER — Ambulatory Visit (INDEPENDENT_AMBULATORY_CARE_PROVIDER_SITE_OTHER): Payer: Medicare PPO

## 2021-04-01 ENCOUNTER — Encounter: Payer: Self-pay | Admitting: Orthopedic Surgery

## 2021-04-01 VITALS — Ht 67.0 in | Wt 228.0 lb

## 2021-04-01 DIAGNOSIS — M79605 Pain in left leg: Secondary | ICD-10-CM | POA: Diagnosis not present

## 2021-04-01 DIAGNOSIS — I872 Venous insufficiency (chronic) (peripheral): Secondary | ICD-10-CM

## 2021-04-01 NOTE — Progress Notes (Signed)
Office Visit Note   Patient: Tammy Good           Date of Birth: Apr 06, 1950           MRN: [id number] Visit Date: 04/01/2021              Requested by: [name], MD [address] [place],  [place] [address] PCP: [name], MD  Chief Complaint  Patient presents with   Left Leg - Pain      HPI: Patient is a 71 year old woman who states she fell about 3 weeks ago.  She states that recently she was sitting in the car and all of a sudden she started have some burning and swelling in the left leg and left ankle.  Patient is on Plavix daily.  Assessment & Plan: Visit Diagnoses:  1. Pain in left leg   2. Venous stasis dermatitis of left lower extremity     Plan: Discussed the importance of resuming using her compression stockings the importance of elevation and working the calf pump.  Follow-Up Instructions: Return in about 4 weeks (around 04/29/2021).   Ortho Exam  Patient is alert, oriented, no adenopathy, well-dressed, normal affect, normal respiratory effort. Examination patient has pitting edema in the left leg with dermatitis there is no tenderness to palpation no evidence of cellulitis there is no open wounds no drainage.  Patient has a strong palpable dorsalis pedis pulse no pain with dorsiflexion the ankle no evidence of a DVT.  Imaging: XR Tibia/Fibula Left  Result Date: 04/01/2021 2 view radiographs of the right tibia and fibula does show calcification of the arteries does show some calcification in the popliteal fossa of the left knee no fractures there are some calcifications in the soft tissue around the ankle  No images are attached to the encounter.  Labs: Lab Results  Component Value Date   HGBA1C 6.7 (H) 11/15/2020   HGBA1C 6.9 (H) 09/02/2013   ESRSEDRATE 52 (H) 08/30/2013   REPTSTATUS 05/30/2019 FINAL 05/25/2019   GRAMSTAIN NO WBC SEEN NO ORGANISMS SEEN  05/25/2019   CULT  05/25/2019    RARE METHICILLIN RESISTANT STAPHYLOCOCCUS  AUREUS RESULT CALLED TO, READ BACK BY AND VERIFIED WITH: RN J BERIS [id number] MLM NO ANAEROBES ISOLATED Performed at [facility], [place] [address], [place], [place] [address]    LABORGA METHICILLIN RESISTANT STAPHYLOCOCCUS AUREUS 05/25/2019     Lab Results  Component Value Date   ALBUMIN 3.0 (L) 11/23/2020   ALBUMIN 2.4 (L) 11/20/2020   ALBUMIN 2.8 (L) 05/23/2019    Lab Results  Component Value Date   MG 2.0 11/16/2020   MG 1.6 (L) 05/30/2019   MG 1.6 (L) 05/27/2019   No results found for: VD25OH  No results found for: PREALBUMIN CBC EXTENDED Latest Ref Rng & Units 12/21/2020 11/23/2020 11/22/2020  WBC 3.4 - 10.8 x10E3/uL 9.5 12.5(H) 11.0(H)  RBC 3.77 - 5.28 x10E6/uL 4.23 4.28 3.77(L)  HGB 11.1 - 15.9 g/dL 12.0 12.1 10.8(L)  HCT 34.0 - 46.6 % 36.9 40.2 34.7(L)  PLT 150 - 450 x10E3/uL 381 511(H) 360  NEUTROABS 1.7 - 7.7 K/uL - 10.5(H) -  LYMPHSABS 0.7 - 4.0 K/uL - 1.1 -     Body mass index is 35.71 kg/m.  Orders:  Orders Placed This Encounter  Procedures   XR Tibia/Fibula Left   No orders of the defined types were placed in this encounter.    Procedures: No procedures performed  Clinical Data: No additional findings.  ROS:  All other systems negative, except as noted in the HPI. Review of Systems  Objective: Vital Signs: Ht '5\' 7"'  (1.702 m)    Wt 228 lb (103.4 kg)    BMI 35.71 kg/m   Specialty Comments:  No specialty comments available.  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   PAD (peripheral artery disease) ([place]) 05/26/2019   Osteomyelitis of left foot ([place]) 05/24/2019    Osteomyelitis ([place]) 05/23/2019   Cellulitis and abscess of foot, except toes 05/18/2019   Chronic osteomyelitis of ankle and foot ([place]) 05/18/2019   Cholesteatoma 02/24/2019   Cellulitis and abscess of toe 11/09/2018   Colon cancer screening 11/30/2017   Long term (current) use of insulin ([place]) 03/05/2017   Iron deficiency anemia 12/10/2014   Obesity (BMI 30-39.9) 11/17/2013   Diabetes mellitus type 2 in obese [facility])    Essential hypertension    Right thigh pain 11/01/2013   PVC's (premature ventricular contractions) 11/01/2013   Status post insertion of drug eluting coronary artery stent to [facility] emergently and PTCA to prox. 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   Hyperlipidemia with target LDL less than 70    Hypothyroidism (acquired)    Mild essential hypertension    Obesity (BMI 30-39.9) 11/17/2013   PAD (peripheral artery disease) ([place]) 05/26/2019   05/26/19: Abd AoGram- BLE runoff -> L SFA orbital atherectomy - PTA w/ DEB & Stent 6 x 40 Luttonix (for focal dissection) - patent Pop A with 3 V runoff. LEA Dopplers 01/03/2020: RABI (prev) 0.68 (0.53)/ RTBI (prev) 0.58 (0.33); LABI (prev) 0.80 (0.64), LTBI (prev) 0.64 (0.51); R mSFA ~50-74%, L mSFA 50-74%. Patent Prox SFA  stent < 49% stenosis   Panhypopituitarism (HCC)    ST elevation myocardial infarction (STEMI) of inferior wall, subsequent episode of care ([place]) 08/2013   80% branch of D1, 40% mid AV groove circumflex, 100% RCA with subacute thrombus -- thrombus extending into RPA V with 100% occlusion after initial angioplasty of mid RCA ;; Post MI ECHO 6/9/'15: EF 50-55%, mild LVH with moderate HK of inferior wall, Gr1 DD, mild LA dilation; mildly reduced RV function    Family History  Problem Relation Age of Onset   Cancer Mother 5       multiple myeloma   Heart attack Father 74   Cancer Sister    Alzheimer's  disease Maternal Grandmother     Past Surgical History:  Procedure Laterality Date   ABDOMINAL AORTOGRAM W/LOWER EXTREMITY N/A 05/26/2019   Procedure: ABDOMINAL AORTOGRAM W/LOWER EXTREMITY;  Surgeon: Marty Heck, MD;  Location: [place] [facility];  Service: Cardiovascular;  Laterality: N/A;   ABDOMINAL AORTOGRAM W/LOWER EXTREMITY N/A 11/15/2020   Procedure: ABDOMINAL AORTOGRAM W/LOWER EXTREMITY;  Surgeon: Marty Heck, MD;  Location: [place] [facility];  Service: Cardiovascular;  Laterality: N/A;   AMPUTATION Left 05/25/2019   Procedure: AMPUTATION RAY 5th;  Surgeon: Trula Slade, DPM;  Location: [place];  Service: Podiatry;  Laterality: Left;   Cardiac Event Monitor  July-August 2015   Sinus rhythm with PVCs   COLONOSCOPY N/A 08/31/2013   Procedure: COLONOSCOPY;  Surgeon: Juanita Craver, MD;  Location: [facility] ENDOSCOPY;  Service: Endoscopy;  Laterality: N/A;   CORONARY STENT INTERVENTION N/A 11/19/2020   Procedure: CORONARY STENT INTERVENTION;  Surgeon: Nelva Bush, MD;  Location: [place] [facility];  Service: Cardiovascular;  Laterality: N/A;   ESOPHAGOGASTRODUODENOSCOPY N/A 09/01/2013   Procedure: ESOPHAGOGASTRODUODENOSCOPY (EGD);  Surgeon: Beryle Beams, MD;  Location: [facility] ENDOSCOPY;  Service: Endoscopy;  Laterality: N/A;  bedside   LEFT HEART CATH AND CORONARY ANGIOGRAPHY  N/A 11/19/2020   Procedure: LEFT HEART CATH AND CORONARY ANGIOGRAPHY;  Surgeon: Nelva Bush, MD;  Location: [place] [facility];  Service: Cardiovascular;  Laterality: N/A;   LEFT HEART CATHETERIZATION WITH CORONARY ANGIOGRAM N/A 08/30/2013   Procedure: LEFT HEART CATHETERIZATION WITH CORONARY ANGIOGRAM;  Surgeon: Leonie Man, MD;  Location: [facility] CATH LAB: 100% mRCA (thrombus - extends to RPAV), 80% D1, 40% AVG Cx.   PERCUTANEOUS CORONARY STENT INTERVENTION (PCI-S)  08/30/2013   Procedure: PERCUTANEOUS CORONARY STENT INTERVENTION (PCI-S);  Surgeon: Leonie Man, MD;  Location: [facility] CATH LAB;  Integrity Resolute DES 2.0 mm x 38 mm -- 3.35 mm.; PTCA of proximal RPA V. - 3.0 mm x 15 mm balloon   PERIPHERAL VASCULAR ATHERECTOMY  05/26/2019   Procedure: PERIPHERAL VASCULAR ATHERECTOMY;  Surgeon: Marty Heck, MD;  Location: [place] [facility];  Service: Cardiovascular;;  Left SFA   PERIPHERAL VASCULAR INTERVENTION  05/26/2019   Procedure: PERIPHERAL VASCULAR INTERVENTION;  Surgeon: Marty Heck, MD;  Location: [place] [facility];  Service: Cardiovascular;;  Left SFA   PERIPHERAL VASCULAR INTERVENTION Right 11/15/2020   Procedure: PERIPHERAL VASCULAR INTERVENTION;  Surgeon: Marty Heck, MD;  Location: [place] [facility];  Service: Cardiovascular;  Laterality: Right;  Superficial Femoral Artery   PITUITARY SURGERY     TRANSTHORACIC ECHOCARDIOGRAM  08/30/2013   mild LVH. EF 50-55%. Moderate HK of the entire inferior myocardium. GR 1 DD. Mild LA dilation. Mildly reduced RV function   TYMPANOMASTOIDECTOMY Right 12/28/2017   Procedure: RIGHT TYMPANOMASTOIDECTOMY;  Surgeon: Leta Baptist, MD;  Location: [place];  Service: ENT;  Laterality: Right;   Social History   Occupational History   Not on file  Tobacco Use   Smoking status: Former   Smokeless tobacco: Never  Substance and Sexual Activity   Alcohol use: No   Drug use: No   Sexual activity: Not Currently     Birth control/protection: Post-menopausal

## 2021-04-08 ENCOUNTER — Ambulatory Visit (HOSPITAL_COMMUNITY)
Admission: RE | Admit: 2021-04-08 | Discharge: 2021-04-08 | Disposition: A | Discharge status: Home / Self Care | Payer: Medicare PPO | Source: Ambulatory Visit | Attending: Vascular Surgery | Admitting: Vascular Surgery

## 2021-04-08 ENCOUNTER — Telehealth: Payer: Self-pay | Admitting: *Deleted

## 2021-04-08 ENCOUNTER — Other Ambulatory Visit: Payer: Self-pay

## 2021-04-08 ENCOUNTER — Ambulatory Visit (INDEPENDENT_AMBULATORY_CARE_PROVIDER_SITE_OTHER)
Admission: RE | Admit: 2021-04-08 | Discharge: 2021-04-08 | Disposition: A | Discharge status: Home / Self Care | Payer: Medicare PPO | Source: Ambulatory Visit | Attending: Vascular Surgery | Admitting: Vascular Surgery

## 2021-04-08 DIAGNOSIS — I739 Peripheral vascular disease, unspecified: Secondary | ICD-10-CM | POA: Diagnosis present

## 2021-04-08 NOTE — Telephone Encounter (Signed)
Patient called c/o of worsening left lower extremity pain and swelling.  States that she is wearing compression and elevating, however Pain is worsening.  Patient was scheduled for left arterial duplex and ABI 04/08/2021 and office visit 04/09/2021.

## 2021-04-09 ENCOUNTER — Other Ambulatory Visit: Payer: Self-pay

## 2021-04-09 ENCOUNTER — Ambulatory Visit: Payer: Medicare PPO | Admitting: Physician Assistant

## 2021-04-09 VITALS — BP 115/64 | HR 79 | Temp 97.4°F | Resp 18 | Ht 67.0 in | Wt 218.5 lb

## 2021-04-09 DIAGNOSIS — I739 Peripheral vascular disease, unspecified: Secondary | ICD-10-CM | POA: Diagnosis not present

## 2021-04-09 DIAGNOSIS — L97322 Non-pressure chronic ulcer of left ankle with fat layer exposed: Secondary | ICD-10-CM

## 2021-04-09 MED ORDER — HYDROCODONE-ACETAMINOPHEN 5-325 MG PO TABS: 1.0000 | ORAL_TABLET | Freq: Four times a day (QID) | ORAL | 0 refills | Status: DC | PRN

## 2021-04-09 NOTE — Progress Notes (Signed)
Office Note     CC:  follow up Requesting Provider:  [name], MD  HPI: Tammy Good is a 71 y.o. ([birth year]) female who presents for evaluation of left leg and foot pain.  Pain has been worsening over the past several weeks after sustaining a fall.  She was seen by [name] and worked up for orthopedic injury which was negative.  She is followed here for PAD with history of left SFA stenting in May 2021 after podiatry performed fifth toe ray amputation.  She also had right SFA stenting in August 2022 by [name].  She is also experiencing left lower extremity edema with new ulceration of her lateral left ankle.  She denies classic rest pain of left foot.  She is ambulatory with a cane.  She is on Eliquis for atrial fibrillation.  She is on a daily statin.  Past medical history also significant for insulin-dependent diabetes mellitus.  She denies tobacco use.   Past Medical History:  Diagnosis Date   CAD S/P percutaneous coronary angioplasty 08/2013   100% mRCA - PCI Integrity Resolute DES 3.0 mm x 38 mm - 3.35 mm; PTCA of RPA V 2.0 mm x 15 mm   Cholesteatoma    right   Diabetes mellitus type 2 in obese (HCC)    On insulin and Invokana   History of osteomyelitis L 5th Toe all 05/2019   s/p Partial Ray Amputation with partial closure; 6 wks Abx & LSFA Atherectomy/DEB PTA with Stent for focal dissection.   Hyperlipidemia with target LDL less than 70    Hypothyroidism (acquired)    Mild essential hypertension    Obesity (BMI 30-39.9) 11/17/2013   PAD (peripheral artery disease) ([place]) 05/26/2019   05/26/19: Abd AoGram- BLE runoff -> L SFA orbital atherectomy - PTA w/ DEB & Stent 6 x 40 Luttonix (for focal dissection) - patent Pop A with 3 V runoff. LEA Dopplers 01/03/2020: RABI (prev) 0.68 (0.53)/ RTBI (prev) 0.58 (0.33); LABI (prev) 0.80 (0.64), LTBI (prev) 0.64 (0.51); R mSFA ~50-74%, L mSFA 50-74%. Patent Prox SFA stent < 49% stenosis   Panhypopituitarism (HCC)    ST elevation  myocardial infarction (STEMI) of inferior wall, subsequent episode of care ([place]) 08/2013   80% branch of D1, 40% mid AV groove circumflex, 100% RCA with subacute thrombus -- thrombus extending into RPA V with 100% occlusion after initial angioplasty of mid RCA ;; Post MI ECHO 6/9/'15: EF 50-55%, mild LVH with moderate HK of inferior wall, Gr1 DD, mild LA dilation; mildly reduced RV function    Past Surgical History:  Procedure Laterality Date   ABDOMINAL AORTOGRAM W/LOWER EXTREMITY N/A 05/26/2019   Procedure: ABDOMINAL AORTOGRAM W/LOWER EXTREMITY;  Surgeon: Marty Heck, MD;  Location: [place] [facility];  Service: Cardiovascular;  Laterality: N/A;   ABDOMINAL AORTOGRAM W/LOWER EXTREMITY N/A 11/15/2020   Procedure: ABDOMINAL AORTOGRAM W/LOWER EXTREMITY;  Surgeon: Marty Heck, MD;  Location: [place] [facility];  Service: Cardiovascular;  Laterality: N/A;   AMPUTATION Left 05/25/2019   Procedure: AMPUTATION RAY 5th;  Surgeon: Trula Slade, DPM;  Location: [place];  Service: Podiatry;  Laterality: Left;   Cardiac Event Monitor  July-August 2015   Sinus rhythm with PVCs   COLONOSCOPY N/A 08/31/2013   Procedure: COLONOSCOPY;  Surgeon: Juanita Craver, MD;  Location: [facility] ENDOSCOPY;  Service: Endoscopy;  Laterality: N/A;   CORONARY STENT INTERVENTION N/A 11/19/2020   Procedure: CORONARY STENT INTERVENTION;  Surgeon: Nelva Bush, MD;  Location:  [place] [facility];  Service: Cardiovascular;  Laterality: N/A;   ESOPHAGOGASTRODUODENOSCOPY N/A 09/01/2013   Procedure: ESOPHAGOGASTRODUODENOSCOPY (EGD);  Surgeon: Beryle Beams, MD;  Location: [facility] ENDOSCOPY;  Service: Endoscopy;  Laterality: N/A;  bedside   LEFT HEART CATH AND CORONARY ANGIOGRAPHY N/A 11/19/2020   Procedure: LEFT HEART CATH AND CORONARY ANGIOGRAPHY;  Surgeon: Nelva Bush, MD;  Location: [place] [facility];  Service: Cardiovascular;  Laterality: N/A;   LEFT HEART CATHETERIZATION WITH CORONARY ANGIOGRAM N/A 08/30/2013    Procedure: LEFT HEART CATHETERIZATION WITH CORONARY ANGIOGRAM;  Surgeon: Leonie Man, MD;  Location: [facility] CATH LAB: 100% mRCA (thrombus - extends to RPAV), 80% D1, 40% AVG Cx.   PERCUTANEOUS CORONARY STENT INTERVENTION (PCI-S)  08/30/2013   Procedure: PERCUTANEOUS CORONARY STENT INTERVENTION (PCI-S);  Surgeon: Leonie Man, MD;  Location: [facility] CATH LAB;  Integrity Resolute DES 2.0 mm x 38 mm -- 3.35 mm.; PTCA of proximal RPA V. - 3.0 mm x 15 mm balloon   PERIPHERAL VASCULAR ATHERECTOMY  05/26/2019   Procedure: PERIPHERAL VASCULAR ATHERECTOMY;  Surgeon: Marty Heck, MD;  Location: [place] [facility];  Service: Cardiovascular;;  Left SFA   PERIPHERAL VASCULAR INTERVENTION  05/26/2019   Procedure: PERIPHERAL VASCULAR INTERVENTION;  Surgeon: Marty Heck, MD;  Location: [place] [facility];  Service: Cardiovascular;;  Left SFA   PERIPHERAL VASCULAR INTERVENTION Right 11/15/2020   Procedure: PERIPHERAL VASCULAR INTERVENTION;  Surgeon: Marty Heck, MD;  Location: [place] [facility];  Service: Cardiovascular;  Laterality: Right;  Superficial Femoral Artery   PITUITARY SURGERY     TRANSTHORACIC ECHOCARDIOGRAM  08/30/2013   mild LVH. EF 50-55%. Moderate HK of the entire inferior myocardium. GR 1 DD. Mild LA dilation. Mildly reduced RV function   TYMPANOMASTOIDECTOMY Right 12/28/2017   Procedure: RIGHT TYMPANOMASTOIDECTOMY;  Surgeon: Leta Baptist, MD;  Location: [place];  Service: ENT;  Laterality: Right;    Social History   Socioeconomic History   Marital status: Widowed    Spouse name: Not on file   Number of children: Not on file   Years of education: Not on file   Highest education level: Not on file  Occupational History   Not on file  Tobacco Use   Smoking status: Former   Smokeless tobacco: Never  Substance and Sexual Activity   Alcohol use: No   Drug use: No   Sexual activity: Not Currently    Birth control/protection: Post-menopausal  Other  Topics Concern   Not on file  Social History Narrative   Widow. Works at [employer].   Former smoker.   Overall not very active.  Hoping to get into water aerobics class.   Social Determinants of Health   Financial Resource Strain: Not on file  Food Insecurity: Not on file  Transportation Needs: Not on file  Physical Activity: Not on file  Stress: Not on file  Social Connections: Not on file  Intimate Partner Violence: Not on file    Family History  Problem Relation Age of Onset   Cancer Mother 75       multiple myeloma   Heart attack Father 83   Cancer Sister    Alzheimer's disease Maternal Grandmother     Current Outpatient Medications  Medication Sig Dispense Refill   apixaban (ELIQUIS) 5 MG TABS tablet Take 1 tablet (5 mg total) by mouth 2 (two) times daily. 60 tablet 6   atorvastatin (LIPITOR) 40 MG tablet Take 1 tablet (  40 mg total) by mouth daily at 6 PM. 30 tablet 0   B-D UF III MINI PEN NEEDLES 31G X 5 MM MISC Inject into the skin.     clopidogrel (PLAVIX) 75 MG tablet Take 1 tablet by mouth once daily with breakfast 90 tablet 0   Continuous Blood Gluc Sensor (FREESTYLE LIBRE 2 SENSOR) MISC Apply topically every 14 (fourteen) days.     dexamethasone (DECADRON) 0.5 MG tablet Take 0.5 mg by mouth daily.     FARXIGA 10 MG TABS tablet Take 10 mg by mouth daily.      ferrous sulfate 325 (65 FE) MG tablet Take 325 mg by mouth daily with breakfast.     Insulin Glargine (BASAGLAR KWIKPEN) 100 UNIT/ML SOPN Inject 48 Units into the skin at bedtime.     levothyroxine (SYNTHROID) 125 MCG tablet Take 1 tablet (125 mcg total) by mouth daily before breakfast. 30 tablet 2   losartan (COZAAR) 25 MG tablet Take 1 tablet (25 mg total) by mouth daily. 30 tablet 11   metoprolol succinate (TOPROL-XL) 25 MG 24 hr tablet TAKE 1 TABLET BY MOUTH EVERY MORNING & TAKE 1 TABLET BY MOUTH AT BEDTIME 180 tablet 0   nitroGLYCERIN (NITROSTAT) 0.4 MG SL tablet Place 1 tablet (0.4 mg total) under  the tongue every 5 (five) minutes as needed for chest pain (up to 3 doses. If taking 3rd dose call 911). 25 tablet 3   NOVOLOG FLEXPEN 100 UNIT/ML FlexPen Inject 5-15 Units into the skin 3 (three) times daily with meals. Per sliding scale     nystatin cream (MYCOSTATIN) Apply 1 application topically 2 (two) times daily. (Patient taking differently: Apply 1 application topically 2 (two) times daily as needed (rash).) 30 g 1   Omega-3 Fatty Acids (FISH OIL) 1000 MG CAPS Take 1,000 mg by mouth daily.     ONETOUCH VERIO test strip   1   pantoprazole (PROTONIX) 40 MG tablet Take 1 tablet (40 mg total) by mouth 2 (two) times daily before a meal. 60 tablet 0   Propylene Glycol (SYSTANE COMPLETE) 0.6 % SOLN Place 1 drop into both eyes daily as needed (dry eyes).     silver sulfADIAZINE (SILVADENE) 1 % cream Apply 1 application topically daily. Apply thin layer to leg wound daily 50 g 0   spironolactone (ALDACTONE) 25 MG tablet Take 1 tablet (25 mg total) by mouth daily. 30 tablet 11   No current facility-administered medications for this visit.    Allergies  Allergen Reactions   Strawberry Extract Itching, Swelling and Anaphylaxis    Mouth swells and gets itchy     REVIEW OF SYSTEMS:   _0  denotes positive finding, _1  denotes negative finding Cardiac  Comments:  Chest pain or chest pressure:    Shortness of breath upon exertion:    Short of breath when lying flat:    Irregular heart rhythm:        Vascular    Pain in calf, thigh, or hip brought on by ambulation:    Pain in feet at night that wakes you up from your sleep:     Blood clot in your veins:    Leg swelling:         Pulmonary    Oxygen at home:    Productive cough:     Wheezing:         Neurologic    Sudden weakness in arms or legs:     Sudden numbness in arms or  legs:     Sudden onset of difficulty speaking or slurred speech:    Temporary loss of vision in one eye:     Problems with dizziness:          Gastrointestinal    Blood in stool:     Vomited blood:         Genitourinary    Burning when urinating:     Blood in urine:        Psychiatric    Major depression:         Hematologic    Bleeding problems:    Problems with blood clotting too easily:        Skin    Rashes or ulcers:        Constitutional    Fever or chills:      PHYSICAL EXAMINATION:  Vitals:   04/09/21 1034  BP: 115/64  Pulse: 79  Resp: 18  Temp: (!) 97.4 F (36.3 C)  TempSrc: Temporal  SpO2: 95%  Weight: 218 lb 8 oz (99.1 kg)  Height: _0  (1.702 m)    General:  WDWN in NAD; vital signs documented above Gait: Not observed HENT: WNL, normocephalic Pulmonary: normal non-labored breathing , without Rales, rhonchi,  wheezing Cardiac: regular HR Abdomen: soft, NT, no masses Skin: without rashes Vascular Exam/Pulses:  Right Left  Femoral 1+ (weak) 1+ (weak)  DP absent absent  PT absent absent   Extremities: Edematous left lower extremity to the level of the mid shin with open ulceration lateral ankle with exposed fat layer Musculoskeletal: no muscle wasting or atrophy  Neurologic: A&O X 3;  No focal weakness or paresthesias are detected Psychiatric:  The pt has Normal affect.   Non-Invasive Vascular Imaging:   Stenotic left SFA stent on arterial duplex  ABI/TBI Today's ABI Today's TBI Previous ABI Previous TBI   +-------+-----------+-----------+------------+------------+   Right   0.77        0.46        0.99         0.73           +-------+-----------+-----------+------------+------------+   Left    0.63        0.34        0.84         0.59             ASSESSMENT/PLAN:: 70 y.o. female with critical limb ischemia of left lower extremity  -Left lower extremity arterial duplex demonstrates a stenotic left SFA stent and a drop in ABI from 0.8-0.6.  She also has a new left lateral ankle ulceration.  Imaging studies were reviewed with [name].  Plan is for aortogram with left lower  extremity runoff and possible intervention.  We discussed the nature of the procedure as well as the risks and she agrees to proceed.  Recommended patient cleanse her left leg and ulcer with soap and water daily and apply dry dressing daily with Ace wrap or light compression.  I have also prescribed narcotic pain medication to bridge her to the date of her procedure.   Dagoberto Ligas, PA-C [facility] [phone]  Clinic MD:   Carlis Abbott

## 2021-04-18 ENCOUNTER — Encounter (HOSPITAL_COMMUNITY): Payer: Self-pay | Admitting: Vascular Surgery

## 2021-04-18 ENCOUNTER — Ambulatory Visit (HOSPITAL_COMMUNITY)
Admission: RE | Admit: 2021-04-18 | Discharge: 2021-04-18 | Disposition: A | Discharge status: Home / Self Care | Payer: Medicare PPO | Attending: Vascular Surgery | Admitting: Vascular Surgery

## 2021-04-18 ENCOUNTER — Other Ambulatory Visit: Payer: Self-pay

## 2021-04-18 ENCOUNTER — Encounter (HOSPITAL_COMMUNITY)
Admission: RE | Disposition: A | Discharge status: Home / Self Care | Payer: Self-pay | Source: Home / Self Care | Attending: Vascular Surgery

## 2021-04-18 DIAGNOSIS — E11621 Type 2 diabetes mellitus with foot ulcer: Secondary | ICD-10-CM | POA: Diagnosis not present

## 2021-04-18 DIAGNOSIS — Z955 Presence of coronary angioplasty implant and graft: Secondary | ICD-10-CM | POA: Diagnosis not present

## 2021-04-18 DIAGNOSIS — I701 Atherosclerosis of renal artery: Secondary | ICD-10-CM

## 2021-04-18 DIAGNOSIS — I4891 Unspecified atrial fibrillation: Secondary | ICD-10-CM | POA: Insufficient documentation

## 2021-04-18 DIAGNOSIS — L97322 Non-pressure chronic ulcer of left ankle with fat layer exposed: Secondary | ICD-10-CM | POA: Diagnosis not present

## 2021-04-18 DIAGNOSIS — E1151 Type 2 diabetes mellitus with diabetic peripheral angiopathy without gangrene: Secondary | ICD-10-CM | POA: Diagnosis present

## 2021-04-18 DIAGNOSIS — Z87891 Personal history of nicotine dependence: Secondary | ICD-10-CM | POA: Insufficient documentation

## 2021-04-18 DIAGNOSIS — Z95828 Presence of other vascular implants and grafts: Secondary | ICD-10-CM | POA: Diagnosis not present

## 2021-04-18 DIAGNOSIS — Z7901 Long term (current) use of anticoagulants: Secondary | ICD-10-CM | POA: Diagnosis not present

## 2021-04-18 DIAGNOSIS — I70292 Other atherosclerosis of native arteries of extremities, left leg: Secondary | ICD-10-CM | POA: Diagnosis not present

## 2021-04-18 DIAGNOSIS — I70222 Atherosclerosis of native arteries of extremities with rest pain, left leg: Secondary | ICD-10-CM | POA: Insufficient documentation

## 2021-04-18 HISTORY — PX: ABDOMINAL AORTOGRAM W/LOWER EXTREMITY: CATH118223

## 2021-04-18 LAB — POCT I-STAT, CHEM 8
BUN: 10 mg/dL (ref 8–23)
Calcium, Ion: 1.18 mmol/L (ref 1.15–1.40)
Chloride: 103 mmol/L (ref 98–111)
Creatinine, Ser: 0.9 mg/dL (ref 0.44–1.00)
Glucose, Bld: 99 mg/dL (ref 70–99)
HCT: 32 % — ABNORMAL LOW (ref 36.0–46.0)
Hemoglobin: 10.9 g/dL — ABNORMAL LOW (ref 12.0–15.0)
Potassium: 3.9 mmol/L (ref 3.5–5.1)
Sodium: 139 mmol/L (ref 135–145)
TCO2: 25 mmol/L (ref 22–32)

## 2021-04-18 LAB — GLUCOSE, CAPILLARY: Glucose-Capillary: 88 mg/dL (ref 70–99)

## 2021-04-18 SURGERY — ABDOMINAL AORTOGRAM W/LOWER EXTREMITY
Anesthesia: LOCAL

## 2021-04-18 MED ORDER — SODIUM CHLORIDE 0.9 % IV SOLN
INTRAVENOUS | Status: DC
Start: 2021-04-18 — End: 2021-04-18

## 2021-04-18 MED ORDER — SODIUM CHLORIDE 0.9 % IV SOLN: 250.0000 mL | INTRAVENOUS | Status: DC | PRN

## 2021-04-18 MED ORDER — IODIXANOL 320 MG/ML IV SOLN
INTRAVENOUS | Status: DC | PRN
  Administered 2021-04-18: 110 mL via INTRA_ARTERIAL

## 2021-04-18 MED ORDER — HEPARIN (PORCINE) IN NACL 1000-0.9 UT/500ML-% IV SOLN
INTRAVENOUS | Status: AC
  Filled 2021-04-18: qty 500

## 2021-04-18 MED ORDER — ACETAMINOPHEN 325 MG PO TABS: 650.0000 mg | ORAL_TABLET | ORAL | Status: DC | PRN

## 2021-04-18 MED ORDER — LABETALOL HCL 5 MG/ML IV SOLN: 10.0000 mg | INTRAVENOUS | Status: DC | PRN

## 2021-04-18 MED ORDER — SODIUM CHLORIDE 0.9% FLUSH: 3.0000 mL | INTRAVENOUS | Status: DC | PRN

## 2021-04-18 MED ORDER — HEPARIN (PORCINE) IN NACL 1000-0.9 UT/500ML-% IV SOLN
INTRAVENOUS | Status: DC | PRN
  Administered 2021-04-18 (×2): 500 mL

## 2021-04-18 MED ORDER — ONDANSETRON HCL 4 MG/2ML IJ SOLN: 4.0000 mg | Freq: Four times a day (QID) | INTRAMUSCULAR | Status: DC | PRN

## 2021-04-18 MED ORDER — HEPARIN SODIUM (PORCINE) 1000 UNIT/ML IJ SOLN
INTRAMUSCULAR | Status: AC
  Filled 2021-04-18: qty 10

## 2021-04-18 MED ORDER — MIDAZOLAM HCL 2 MG/2ML IJ SOLN
INTRAMUSCULAR | Status: DC | PRN
  Administered 2021-04-18: 1 mg via INTRAVENOUS

## 2021-04-18 MED ORDER — FENTANYL CITRATE (PF) 100 MCG/2ML IJ SOLN
INTRAMUSCULAR | Status: AC
  Filled 2021-04-18: qty 2

## 2021-04-18 MED ORDER — LIDOCAINE HCL (PF) 1 % IJ SOLN
INTRAMUSCULAR | Status: DC | PRN
  Administered 2021-04-18: 18 mL via INTRADERMAL

## 2021-04-18 MED ORDER — MIDAZOLAM HCL 2 MG/2ML IJ SOLN
INTRAMUSCULAR | Status: AC
  Filled 2021-04-18: qty 2

## 2021-04-18 MED ORDER — HYDRALAZINE HCL 20 MG/ML IJ SOLN: 5.0000 mg | INTRAMUSCULAR | Status: DC | PRN

## 2021-04-18 MED ORDER — SODIUM CHLORIDE 0.9% FLUSH: 3.0000 mL | Freq: Two times a day (BID) | INTRAVENOUS | Status: DC

## 2021-04-18 MED ORDER — SODIUM CHLORIDE 0.9 % IV SOLN: INTRAVENOUS | Status: DC

## 2021-04-18 MED ORDER — FENTANYL CITRATE (PF) 100 MCG/2ML IJ SOLN
INTRAMUSCULAR | Status: DC | PRN
  Administered 2021-04-18: 25 ug via INTRAVENOUS

## 2021-04-18 SURGICAL SUPPLY — 10 items
CATH OMNI FLUSH 5F 65CM (CATHETERS) ×1 IMPLANT
DEVICE CLOSURE MYNXGRIP 5F (Vascular Products) ×1 IMPLANT
KIT MICROPUNCTURE NIT STIFF (SHEATH) ×1 IMPLANT
KIT PV (KITS) ×3 IMPLANT
SHEATH PINNACLE 5F 10CM (SHEATH) ×1 IMPLANT
SHEATH PROBE COVER 6X72 (BAG) ×1 IMPLANT
SYR MEDRAD MARK V 150ML (SYRINGE) ×1 IMPLANT
TRANSDUCER W/STOPCOCK (MISCELLANEOUS) ×3 IMPLANT
TRAY PV CATH (CUSTOM PROCEDURE TRAY) ×3 IMPLANT
WIRE BENTSON .035X145CM (WIRE) ×1 IMPLANT

## 2021-04-18 NOTE — H&P (Signed)
History and Physical Interval Note:  04/18/2021 7:32 AM  [facility] Human  has presented today for surgery, with the diagnosis of ischemia.  The various methods of treatment have been discussed with the patient and family. After consideration of risks, benefits and other options for treatment, the patient has consented to  Procedure(s): ABDOMINAL AORTOGRAM W/LOWER EXTREMITY (N/A) as a surgical intervention.  The patient's history has been reviewed, patient examined, no change in status, stable for surgery.  I have reviewed the patient's chart and labs.  Questions were answered to the patient's satisfaction.     Marty Heck  Office Note        CC:  follow up Requesting Provider:  [name], MD   HPI: SHEQUITA PEPLINSKI is a 71 y.o. (10/12/1950) female who presents for evaluation of left leg and foot pain.  Pain has been worsening over the past several weeks after sustaining a fall.  Tammy Good was seen by [name] and worked up for orthopedic injury which was negative.  Tammy Good is followed here for PAD with history of left SFA stenting in May 2021 after podiatry performed fifth toe ray amputation.  Tammy Good also had right SFA stenting in August 2022 by [name].  Tammy Good is also experiencing left lower extremity edema with new ulceration of her lateral left ankle.  Tammy Good denies classic rest pain of left foot.  Tammy Good is ambulatory with a cane.  Tammy Good is on Eliquis for atrial fibrillation.  Tammy Good is on a daily statin.  Past medical history also significant for insulin-dependent diabetes mellitus.  Tammy Good denies tobacco use.         Past Medical History:  Diagnosis Date   CAD S/P percutaneous coronary angioplasty 08/2013    100% mRCA - PCI Integrity Resolute DES 3.0 mm x 38 mm - 3.35 mm; PTCA of RPA V 2.0 mm x 15 mm   Cholesteatoma      right   Diabetes mellitus type 2 in obese (HCC)      On insulin and Invokana   History of osteomyelitis L 5th Toe all 05/2019    s/p Partial Ray Amputation with partial  closure; 6 wks Abx & LSFA Atherectomy/DEB PTA with Stent for focal dissection.   Hyperlipidemia with target LDL less than 70     Hypothyroidism (acquired)     Mild essential hypertension     Obesity (BMI 30-39.9) 11/17/2013   PAD (peripheral artery disease) ([place]) 05/26/2019    05/26/19: Abd AoGram- BLE runoff -> L SFA orbital atherectomy - PTA w/ DEB & Stent 6 x 40 Luttonix (for focal dissection) - patent Pop A with 3 V runoff. LEA Dopplers 01/03/2020: RABI (prev) 0.68 (0.53)/ RTBI (prev) 0.58 (0.33); LABI (prev) 0.80 (0.64), LTBI (prev) 0.64 (0.51); R mSFA ~50-74%, L mSFA 50-74%. Patent Prox SFA stent < 49% stenosis   Panhypopituitarism (HCC)     ST elevation myocardial infarction (STEMI) of inferior wall, subsequent episode of care ([place]) 08/2013    80% branch of D1, 40% mid AV groove circumflex, 100% RCA with subacute thrombus -- thrombus extending into RPA V with 100% occlusion after initial angioplasty of mid RCA ;; Post MI ECHO 6/9/'15: EF 50-55%, mild LVH with moderate HK of inferior wall, Gr1 DD, mild LA dilation; mildly reduced RV function           Past Surgical History:  Procedure Laterality Date   ABDOMINAL AORTOGRAM W/LOWER EXTREMITY N/A 05/26/2019    Procedure: ABDOMINAL AORTOGRAM W/LOWER EXTREMITY;  Surgeon: Marty Heck, MD;  Location: [place] [facility] [facility];  Service: Cardiovascular;  Laterality: N/A;   ABDOMINAL AORTOGRAM W/LOWER EXTREMITY N/A 11/15/2020    Procedure: ABDOMINAL AORTOGRAM W/LOWER EXTREMITY;  Surgeon: Marty Heck, MD;  Location: [place] [facility];  Service: Cardiovascular;  Laterality: N/A;   AMPUTATION Left 05/25/2019    Procedure: AMPUTATION RAY 5th;  Surgeon: Trula Slade, DPM;  Location: [facility];  Service: Podiatry;  Laterality: Left;   Cardiac Event Monitor   July-August 2015    Sinus rhythm with PVCs   COLONOSCOPY N/A 08/31/2013    Procedure: COLONOSCOPY;  Surgeon: Juanita Craver, MD;  Location: [facility] ENDOSCOPY;  Service: Endoscopy;  Laterality: N/A;    CORONARY STENT INTERVENTION N/A 11/19/2020    Procedure: CORONARY STENT INTERVENTION;  Surgeon: Nelva Bush, MD;  Location: [place] [facility];  Service: Cardiovascular;  Laterality: N/A;   ESOPHAGOGASTRODUODENOSCOPY N/A 09/01/2013    Procedure: ESOPHAGOGASTRODUODENOSCOPY (EGD);  Surgeon: Beryle Beams, MD;  Location: [facility] ENDOSCOPY;  Service: Endoscopy;  Laterality: N/A;  bedside   LEFT HEART CATH AND CORONARY ANGIOGRAPHY N/A 11/19/2020    Procedure: LEFT HEART CATH AND CORONARY ANGIOGRAPHY;  Surgeon: Nelva Bush, MD;  Location: [place] [facility];  Service: Cardiovascular;  Laterality: N/A;   LEFT HEART CATHETERIZATION WITH CORONARY ANGIOGRAM N/A 08/30/2013    Procedure: LEFT HEART CATHETERIZATION WITH CORONARY ANGIOGRAM;  Surgeon: Leonie Man, MD;  Location[facility] CATH LAB: 100% mRCA (thrombus - extends to RPAV), 80% D1, 40% AVG Cx.   PERCUTANEOUS CORONARY STENT INTERVENTION (PCI-S)   08/30/2013    Procedure: PERCUTANEOUS CORONARY STENT INTERVENTION (PCI-S);  Surgeon: Leonie Man, MD;  Location: [facility] CATH LAB;  Integrity Resolute DES 2.0 mm x 38 mm -- 3.35 mm.; PTCA of proximal RPA V. - 3.0 mm x 15 mm balloon   PERIPHERAL VASCULAR ATHERECTOMY   05/26/2019    Procedure: PERIPHERAL VASCULAR ATHERECTOMY;  Surgeon: Marty Heck, MD;  Location: [place] [facility];  Service: Cardiovascular;;  Left SFA   PERIPHERAL VASCULAR INTERVENTION   05/26/2019    Procedure: PERIPHERAL VASCULAR INTERVENTION;  Surgeon: Marty Heck, MD;  Location: [place] [facility];  Service: Cardiovascular;;  Left SFA   PERIPHERAL VASCULAR INTERVENTION Right 11/15/2020    Procedure: PERIPHERAL VASCULAR INTERVENTION;  Surgeon: Marty Heck, MD;  Location: [place] [facility];  Service: Cardiovascular;  Laterality: Right;  Superficial Femoral Artery   PITUITARY SURGERY       TRANSTHORACIC ECHOCARDIOGRAM   08/30/2013    mild LVH. EF 50-55%. Moderate HK of the entire inferior myocardium. GR 1 DD.  Mild LA dilation. Mildly reduced RV function   TYMPANOMASTOIDECTOMY Right 12/28/2017    Procedure: RIGHT TYMPANOMASTOIDECTOMY;  Surgeon: Leta Baptist, MD;  Location: [place];  Service: ENT;  Laterality: Right;      Social History         Socioeconomic History   Marital status: Widowed      Spouse name: Not on file   Number of children: Not on file   Years of education: Not on file   Highest education level: Not on file  Occupational History   Not on file  Tobacco Use   Smoking status: Former   Smokeless tobacco: Never  Substance and Sexual Activity   Alcohol use: No   Drug use: No   Sexual activity: Not Currently      Birth control/protection: Post-menopausal  Other Topics Concern   Not on file  Social History Narrative    Widow. Works at [employer].    Former smoker.    Overall not very active.  Hoping to get into water aerobics class.    Social Determinants of Health    Financial Resource Strain: Not on file  Food Insecurity: Not on file  Transportation Needs: Not on file  Physical Activity: Not on file  Stress: Not on file  Social Connections: Not on file  Intimate Partner Violence: Not on file           Family History  Problem Relation Age of Onset   Cancer Mother 80        multiple myeloma   Heart attack Father 84   Cancer Sister     Alzheimer's disease Maternal Grandmother              Current Outpatient Medications  Medication Sig Dispense Refill   apixaban (ELIQUIS) 5 MG TABS tablet Take 1 tablet (5 mg total) by mouth 2 (two) times daily. 60 tablet 6   atorvastatin (LIPITOR) 40 MG tablet Take 1 tablet (40 mg total) by mouth daily at 6 PM. 30 tablet 0   B-D UF III MINI PEN NEEDLES 31G X 5 MM MISC Inject into the skin.       clopidogrel (PLAVIX) 75 MG tablet Take 1 tablet by mouth once daily with breakfast 90 tablet 0   Continuous Blood Gluc Sensor (FREESTYLE LIBRE 2 SENSOR) MISC Apply topically every 14 (fourteen) days.        dexamethasone (DECADRON) 0.5 MG tablet Take 0.5 mg by mouth daily.       FARXIGA 10 MG TABS tablet Take 10 mg by mouth daily.        ferrous sulfate 325 (65 FE) MG tablet Take 325 mg by mouth daily with breakfast.       Insulin Glargine (BASAGLAR KWIKPEN) 100 UNIT/ML SOPN Inject 48 Units into the skin at bedtime.       levothyroxine (SYNTHROID) 125 MCG tablet Take 1 tablet (125 mcg total) by mouth daily before breakfast. 30 tablet 2   losartan (COZAAR) 25 MG tablet Take 1 tablet (25 mg total) by mouth daily. 30 tablet 11   metoprolol succinate (TOPROL-XL) 25 MG 24 hr tablet TAKE 1 TABLET BY MOUTH EVERY MORNING & TAKE 1 TABLET BY MOUTH AT BEDTIME 180 tablet 0   nitroGLYCERIN (NITROSTAT) 0.4 MG SL tablet Place 1 tablet (0.4 mg total) under the tongue every 5 (five) minutes as needed for chest pain (up to 3 doses. If taking 3rd dose call 911). 25 tablet 3   NOVOLOG FLEXPEN 100 UNIT/ML FlexPen Inject 5-15 Units into the skin 3 (three) times daily with meals. Per sliding scale       nystatin cream (MYCOSTATIN) Apply 1 application topically 2 (two) times daily. (Patient taking differently: Apply 1 application topically 2 (two) times daily as needed (rash).) 30 g 1   Omega-3 Fatty Acids (FISH OIL) 1000 MG CAPS Take 1,000 mg by mouth daily.       ONETOUCH VERIO test strip     1   pantoprazole (PROTONIX) 40 MG tablet Take 1 tablet (40 mg total) by mouth 2 (two) times daily before a meal. 60 tablet 0   Propylene Glycol (SYSTANE COMPLETE) 0.6 % SOLN Place 1 drop into both eyes daily as needed (dry eyes).       silver sulfADIAZINE (SILVADENE) 1 % cream Apply 1 application topically daily. Apply thin layer  to leg wound daily 50 g 0   spironolactone (ALDACTONE) 25 MG tablet Take 1 tablet (25 mg total) by mouth daily. 30 tablet 11    No current facility-administered medications for this visit.           Allergies  Allergen Reactions   Strawberry Extract Itching, Swelling and Anaphylaxis      Mouth swells  and gets itchy        REVIEW OF SYSTEMS:    _0  denotes positive finding, _1  denotes negative finding Cardiac   Comments:  Chest pain or chest pressure:      Shortness of breath upon exertion:      Short of breath when lying flat:      Irregular heart rhythm:             Vascular      Pain in calf, thigh, or hip brought on by ambulation:      Pain in feet at night that wakes you up from your sleep:       Blood clot in your veins:      Leg swelling:              Pulmonary      Oxygen at home:      Productive cough:       Wheezing:              Neurologic      Sudden weakness in arms or legs:       Sudden numbness in arms or legs:       Sudden onset of difficulty speaking or slurred speech:      Temporary loss of vision in one eye:       Problems with dizziness:              Gastrointestinal      Blood in stool:       Vomited blood:              Genitourinary      Burning when urinating:       Blood in urine:             Psychiatric      Major depression:              Hematologic      Bleeding problems:      Problems with blood clotting too easily:             Skin      Rashes or ulcers:             Constitutional      Fever or chills:          PHYSICAL EXAMINATION:      Vitals:    04/09/21 1034  BP: 115/64  Pulse: 79  Resp: 18  Temp: (!) 97.4 F (36.3 C)  TempSrc: Temporal  SpO2: 95%  Weight: 218 lb 8 oz (99.1 kg)  Height: _2  (1.702 m)      General:  WDWN in NAD; vital signs documented above Gait: Not observed HENT: WNL, normocephalic Pulmonary: normal non-labored breathing , without Rales, rhonchi,  wheezing Cardiac: regular HR Abdomen: soft, NT, no masses Skin: without rashes Vascular Exam/Pulses:   Right Left  Femoral 1+ (weak) 1+ (weak)  DP absent absent  PT absent absent    Extremities: Edematous left lower extremity to the level of the mid shin with open ulceration lateral ankle with exposed fat layer Musculoskeletal:  no  muscle wasting or atrophy       Neurologic: A&O X 3;  No focal weakness or paresthesias are detected Psychiatric:  The pt has Normal affect.     Non-Invasive Vascular Imaging:   Stenotic left SFA stent on arterial duplex   ABI/TBI Today's ABI Today's TBI Previous ABI Previous TBI   +-------+-----------+-----------+------------+------------+   Right   0.77        0.46        0.99         0.73           +-------+-----------+-----------+------------+------------+   Left    0.63        0.34        0.84         0.59               ASSESSMENT/PLAN:: 71 y.o. female with critical limb ischemia of left lower extremity   -Left lower extremity arterial duplex demonstrates a stenotic left SFA stent and a drop in ABI from 0.8-0.6.  Tammy Good also has a new left lateral ankle ulceration.  Imaging studies were reviewed with [name].  Plan is for aortogram with left lower extremity runoff and possible intervention.  We discussed the nature of the procedure as well as the risks and Tammy Good agrees to proceed.  Recommended patient cleanse her left leg and ulcer with soap and water daily and apply dry dressing daily with Ace wrap or light compression.  I have also prescribed narcotic pain medication to bridge her to the date of her procedure.     Dagoberto Ligas, PA-C [facility] [phone]   Clinic MD:   Carlis Abbott

## 2021-04-18 NOTE — Discharge Instructions (Signed)
Patient can resume her anticoagulation post procedure day 1.  This would be Friday, January 27.  Her blood flow in the left leg is optimized and should be more than adequate for wound healing.  We will arrange follow-up in 6 months with noninvasive imaging

## 2021-04-18 NOTE — Op Note (Signed)
° ° °  Patient name: Tammy Good MRN: [id number] DOB: 1951-01-07 Sex: female  04/18/2021 Pre-operative Diagnosis: Left lower extremity foot wound with known PAD and concern for in-stent stenosis of left SFA stent Post-operative diagnosis:  Same Surgeon:  Marty Heck, MD Procedure Performed: 1.  Ultrasound-guided access right common femoral artery 2.  Aortogram with catheter selection of aorta 3.  Left lower extremity arteriogram with selection of second-order branches 4.  Mynx closure of the right common femoral artery 5.  25 minutes of monitored moderate conscious sedation time  Contrast: 110 mL  Indications: Patient is a 71 year old female who recently sustained a fall and now has a left foot wound.  She was seen in follow-up in our office given a history of PAD with left SFA stenting in 2021.  Noninvasive imaging suggested in-stent stenosis of the left SFA stent.  She presents today after risk benefits discussed.  Findings:   Aortogram showed a widely patent infrarenal aorta and widely patent bilateral iliac arteries.  The right renal is widely patent.  The left renal has about a 30% stenosis near the ostium.  Left lower extremity runoff shows a widely patent common femoral artery with no evidence of flow-limiting stenosis.  The profunda is patent but diseased.  The proximal SFA stent is widely patent with no in-stent stenosis.  The mid to distal SFA is diffusely diseased but no flow-limiting stenosis and disease appears to be less than 50%.  Patent above and below-knee popliteal artery.  Patent three-vessel runoff into the foot.  Very brisk flow down the left lower extremity.  No intervention necessary.  Optimized from a vascular standpoint.   Procedure:  The patient was identified in the holding area and taken to room 8.  The patient was then placed supine on the table and prepped and draped in the usual sterile fashion.  A time out was called.  Ultrasound was used to evaluate  the right common femoral artery.  It was patent .  A digital ultrasound image was acquired.  A micropuncture needle was used to access the right common femoral artery under ultrasound guidance.  An 018 wire was advanced without resistance and a micropuncture sheath was placed.  The 018 wire was removed and a benson wire was placed.  The micropuncture sheath was exchanged for a 5 french sheath.  An omniflush catheter was advanced over the wire to the level of L-1.  An abdominal angiogram was obtained.  Next, using the omniflush catheter and a benson wire, the aortic bifurcation was crossed and the catheter was placed into the the left external iliac artery and left runoff was obtained.  After evaluating images, we did not feel there was any flow limitation.  The common femoral is widely patent.  The left SFA stent in the proximal vessel is widely patent.  There is some diffuse disease in the SFA that does not appear flow-limiting and all less than 50%.  Three-vessel runoff into the foot.  Wires and catheters removed.  An access shot was obtained in the right common femoral artery.  Mynx closure of the right common femoral artery.  Taken to holding in stable condition.  Plan: Patient has three-vessel runoff in the left lower extremity and is optimized from a vascular standpoint.  We will arrange follow-up in 6 months with noninvasive imaging.     Marty Heck, MD [facility] [place] Office: [phone]

## 2021-04-19 ENCOUNTER — Other Ambulatory Visit: Payer: Self-pay

## 2021-04-19 ENCOUNTER — Ambulatory Visit (INDEPENDENT_AMBULATORY_CARE_PROVIDER_SITE_OTHER): Payer: Medicare PPO

## 2021-04-19 ENCOUNTER — Ambulatory Visit (INDEPENDENT_AMBULATORY_CARE_PROVIDER_SITE_OTHER): Payer: Medicare PPO | Admitting: Surgical

## 2021-04-19 ENCOUNTER — Encounter: Payer: Self-pay | Admitting: Surgical

## 2021-04-19 DIAGNOSIS — M79605 Pain in left leg: Secondary | ICD-10-CM

## 2021-04-19 DIAGNOSIS — I872 Venous insufficiency (chronic) (peripheral): Secondary | ICD-10-CM

## 2021-04-19 DIAGNOSIS — R110 Nausea: Secondary | ICD-10-CM

## 2021-04-19 MED ORDER — HYDROCODONE-ACETAMINOPHEN 5-325 MG PO TABS: 1.0000 | ORAL_TABLET | Freq: Two times a day (BID) | ORAL | 0 refills | Status: DC | PRN

## 2021-04-19 MED ORDER — PROCHLORPERAZINE MALEATE 5 MG PO TABS: 5.0000 mg | ORAL_TABLET | Freq: Three times a day (TID) | ORAL | 0 refills | Status: DC | PRN

## 2021-04-19 NOTE — Progress Notes (Signed)
Office Visit Note   Patient: Tammy Good           Date of Birth: 1951-03-03           MRN: [id number] Visit Date: 04/19/2021 Requested by: [name], MD [address] [place],  [place] [address] PCP: [name], MD  Subjective: Chief Complaint  Patient presents with   Left Leg - Wound Check    HPI: Tammy Good is a 71 y.o. female who presents to the office complaining of left leg wound.  Patient has wound to the lateral calf with tendon exposed and continual drainage.  She also has painful swelling to the medial ankle of the left leg.  Superficial ulcer of the lateral foot noted as well.  She has history of peripheral vascular disease and prior fifth ray amputation by [name] D.P.M.  She denies any fevers or chills.  She does not feel like she has any flulike symptoms but she does note persistent nausea and anorexia over the last month.  Nausea has been worse since yesterday when she had a aortogram by [name].  She has been vomiting since the procedure.  She cannot really bear weight on this foot due to pain..                ROS: All systems reviewed are negative as they relate to the chief complaint within the history of present illness.  Patient denies fevers or chills.  Assessment & Plan: Visit Diagnoses:  1. Pain in left leg   2. Venous stasis dermatitis of left lower extremity     Plan: Patient is a 71 year old female who presents for evaluation of left lateral calf wound.  She has had prior treatment by [name] but states that this wound is new.  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.  She has noticed this wound in the last week.  No fevers or chills or signs of infection but she does have drainage from the wound.  She is also had persistent nausea and vomiting in the past 24 hours that is been worse but her family member notes that she has been nauseous and has been eating less over the last month.  Plan is to refer patient to  see [name] next week.  Patient was discussed with him today.  Left foot radiographs were obtained today that are negative for any sign of osteomyelitis.  Also plan to refer patient to gastroenterology for evaluation of persistent nausea and anorexia.  Prescribed pain medicine to help with her pain.  Also prescribed prochlorperazine to help with nausea; recent EKGs show prolonged QTc so Zofran was avoided.  The wound was dressed with Silvadene and dry dressing and wrapped with Ace bandage.  She will follow-up on Thursday with [name].  Follow-Up Instructions: No follow-ups on file.   Orders:  Orders Placed This Encounter  Procedures   XR Foot Complete Left   Meds ordered this encounter  Medications   HYDROcodone-acetaminophen (NORCO) 5-325 MG tablet    Sig: Take 1 tablet by mouth every 12 (twelve) hours as needed for moderate pain.    Dispense:  20 tablet    Refill:  0   prochlorperazine (COMPAZINE) 5 MG tablet    Sig: Take 1 tablet (5 mg total) by mouth every 8 (eight) hours as needed for nausea or vomiting.    Dispense:  20 tablet    Refill:  0  Procedures: No procedures performed   Clinical Data: No additional findings.  Objective: Vital Signs: There were no vitals taken for this visit.  Physical Exam:  Constitutional: Patient appears well-developed HEENT:  Head: Normocephalic Eyes:EOM are normal Neck: Normal range of motion Cardiovascular: Normal rate Pulmonary/chest: Effort normal Neurologic: Patient is alert Skin: Skin is warm Psychiatric: Patient has normal mood and affect  Ortho Exam: Ortho exam demonstrates left foot with no palpable DP pulse or PT pulse.  No calf tenderness.  Negative Homans' sign.  Draining quarter sized wound noted to the lateral calf with tendon exposed and expressible drainage.  Small abrasion over the lateral aspect of the left foot in the midfoot area.  Diffuse soft tissue swelling throughout the entire foot, mainly localized to the  medial aspect of the ankle.  Specialty Comments:  No specialty comments available.  Imaging: No results found.   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) 05/23/2019   Cellulitis and abscess of foot, except toes 05/18/2019   Chronic osteomyelitis of ankle and foot ([place]) 05/18/2019   Cholesteatoma 02/24/2019   Cellulitis and abscess of toe 11/09/2018   Colon cancer screening 11/30/2017   Long term (current) use of insulin ([place]) 03/05/2017   Iron deficiency anemia 12/10/2014   Obesity (BMI 30-39.9) 11/17/2013   Diabetes mellitus type 2 in obese [facility])    Essential hypertension    Right thigh pain 11/01/2013   PVC's (premature ventricular contractions) 11/01/2013   Status post insertion of drug eluting coronary artery stent to [facility] emergently and PTCA to prox. 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   Hyperlipidemia with target LDL less than 70    Hypothyroidism (acquired)    Mild essential hypertension    Obesity (BMI 30-39.9) 11/17/2013   PAD (peripheral artery disease) ([place]) 05/26/2019   05/26/19: Abd AoGram- BLE runoff -> L SFA orbital atherectomy - PTA w/ DEB & Stent 6 x 40 Luttonix (for focal dissection) - patent Pop A with 3 V runoff. LEA Dopplers 01/03/2020: RABI (prev) 0.68 (0.53)/ RTBI (prev) 0.58 (0.33); LABI (prev) 0.80 (0.64), LTBI (prev) 0.64 (0.51); R mSFA ~50-74%, L mSFA 50-74%. Patent Prox SFA stent < 49% stenosis   Panhypopituitarism (HCC)    ST elevation myocardial  infarction (STEMI) of inferior wall, subsequent episode of care ([place]) 08/2013   80% branch of D1, 40% mid AV groove circumflex, 100% RCA with subacute thrombus -- thrombus extending into RPA V with 100% occlusion after initial angioplasty of mid RCA ;; Post MI ECHO 6/9/'15: EF 50-55%, mild LVH with moderate HK of inferior wall, Gr1 DD, mild LA dilation; mildly reduced RV function    Family History  Problem Relation Age of Onset   Cancer Mother 26       multiple myeloma   Heart attack Father 60   Cancer Sister    Alzheimer's disease Maternal Grandmother     Past Surgical History:  Procedure Laterality Date   ABDOMINAL AORTOGRAM W/LOWER EXTREMITY N/A 05/26/2019   Procedure: ABDOMINAL AORTOGRAM W/LOWER EXTREMITY;  Surgeon: Marty Heck, MD;  Location: [place] [facility];  Service: Cardiovascular;  Laterality: N/A;   ABDOMINAL AORTOGRAM W/LOWER EXTREMITY N/A 11/15/2020   Procedure: ABDOMINAL AORTOGRAM W/LOWER EXTREMITY;  Surgeon: Marty Heck, MD;  Location: [place] [facility];  Service: Cardiovascular;  Laterality: N/A;   ABDOMINAL AORTOGRAM W/LOWER EXTREMITY N/A 04/18/2021   Procedure: ABDOMINAL AORTOGRAM W/LOWER EXTREMITY;  Surgeon: Marty Heck, MD;  Location: [place]  [facility];  Service: Cardiovascular;  Laterality: N/A;   AMPUTATION Left 05/25/2019   Procedure: AMPUTATION RAY 5th;  Surgeon: Trula Slade, DPM;  Location: [facility];  Service: Podiatry;  Laterality: Left;   Cardiac Event Monitor  July-August 2015   Sinus rhythm with PVCs   COLONOSCOPY N/A 08/31/2013   Procedure: COLONOSCOPY;  Surgeon: Juanita Craver, MD;  Location: [facility] ENDOSCOPY;  Service: Endoscopy;  Laterality: N/A;   CORONARY STENT INTERVENTION N/A 11/19/2020   Procedure: CORONARY STENT INTERVENTION;  Surgeon: Nelva Bush, MD;  Location: [place] [facility];  Service: Cardiovascular;  Laterality: N/A;   ESOPHAGOGASTRODUODENOSCOPY N/A 09/01/2013   Procedure: ESOPHAGOGASTRODUODENOSCOPY (EGD);  Surgeon: Beryle Beams, MD;  Location: [facility] ENDOSCOPY;  Service: Endoscopy;  Laterality: N/A;  bedside   LEFT HEART CATH AND CORONARY ANGIOGRAPHY N/A 11/19/2020   Procedure: LEFT HEART CATH AND CORONARY ANGIOGRAPHY;  Surgeon: Nelva Bush, MD;  Location: [place] [facility];  Service: Cardiovascular;  Laterality: N/A;   LEFT HEART CATHETERIZATION WITH CORONARY ANGIOGRAM N/A 08/30/2013   Procedure: LEFT HEART CATHETERIZATION WITH CORONARY ANGIOGRAM;  Surgeon: Leonie Man, MD;  Location: [facility] CATH LAB: 100% mRCA (thrombus - extends to RPAV), 80% D1, 40% AVG Cx.   PERCUTANEOUS CORONARY STENT INTERVENTION (PCI-S)  08/30/2013   Procedure: PERCUTANEOUS CORONARY STENT INTERVENTION (PCI-S);  Surgeon: Leonie Man, MD;  Location: [facility] CATH LAB;  Integrity Resolute DES 2.0 mm x 38 mm -- 3.35 mm.; PTCA of proximal RPA V. - 3.0 mm x 15 mm balloon   PERIPHERAL VASCULAR ATHERECTOMY  05/26/2019   Procedure: PERIPHERAL VASCULAR ATHERECTOMY;  Surgeon: Marty Heck, MD;  Location: [place] [facility];  Service: Cardiovascular;;  Left SFA   PERIPHERAL VASCULAR INTERVENTION  05/26/2019   Procedure: PERIPHERAL VASCULAR INTERVENTION;  Surgeon: Marty Heck, MD;  Location: [place] [facility];  Service:  Cardiovascular;;  Left SFA   PERIPHERAL VASCULAR INTERVENTION Right 11/15/2020   Procedure: PERIPHERAL VASCULAR INTERVENTION;  Surgeon: Marty Heck, MD;  Location: [place] [facility];  Service: Cardiovascular;  Laterality: Right;  Superficial Femoral Artery   PITUITARY SURGERY     TRANSTHORACIC ECHOCARDIOGRAM  08/30/2013   mild LVH. EF 50-55%. Moderate HK of the entire inferior myocardium. GR 1 DD. Mild LA dilation.  Mildly reduced RV function   TYMPANOMASTOIDECTOMY Right 12/28/2017   Procedure: RIGHT TYMPANOMASTOIDECTOMY;  Surgeon: Leta Baptist, MD;  Location: [place];  Service: ENT;  Laterality: Right;   Social History   Occupational History   Not on file  Tobacco Use   Smoking status: Former   Smokeless tobacco: Never  Substance and Sexual Activity   Alcohol use: No   Drug use: No   Sexual activity: Not Currently    Birth control/protection: Post-menopausal

## 2021-04-22 ENCOUNTER — Other Ambulatory Visit: Payer: Self-pay

## 2021-04-22 ENCOUNTER — Emergency Department (HOSPITAL_COMMUNITY): Payer: Medicare PPO

## 2021-04-22 ENCOUNTER — Inpatient Hospital Stay (HOSPITAL_COMMUNITY)
Admission: EM | Admit: 2021-04-22 | Discharge: 2021-04-27 | DRG: 853 | Disposition: A | Discharge status: Home Health | Payer: Medicare PPO | Attending: Family Medicine | Admitting: Family Medicine

## 2021-04-22 ENCOUNTER — Encounter (HOSPITAL_COMMUNITY): Payer: Self-pay | Admitting: Emergency Medicine

## 2021-04-22 ENCOUNTER — Ambulatory Visit: Payer: Medicare PPO | Admitting: Podiatry

## 2021-04-22 DIAGNOSIS — L03116 Cellulitis of left lower limb: Secondary | ICD-10-CM

## 2021-04-22 DIAGNOSIS — E8809 Other disorders of plasma-protein metabolism, not elsewhere classified: Secondary | ICD-10-CM | POA: Diagnosis present

## 2021-04-22 DIAGNOSIS — A4102 Sepsis due to Methicillin resistant Staphylococcus aureus: Secondary | ICD-10-CM | POA: Diagnosis not present

## 2021-04-22 DIAGNOSIS — A419 Sepsis, unspecified organism: Secondary | ICD-10-CM

## 2021-04-22 DIAGNOSIS — E43 Unspecified severe protein-calorie malnutrition: Secondary | ICD-10-CM | POA: Insufficient documentation

## 2021-04-22 DIAGNOSIS — M659 Synovitis and tenosynovitis, unspecified: Secondary | ICD-10-CM | POA: Diagnosis present

## 2021-04-22 DIAGNOSIS — D649 Anemia, unspecified: Secondary | ICD-10-CM | POA: Diagnosis present

## 2021-04-22 DIAGNOSIS — I252 Old myocardial infarction: Secondary | ICD-10-CM

## 2021-04-22 DIAGNOSIS — Z20822 Contact with and (suspected) exposure to covid-19: Secondary | ICD-10-CM | POA: Diagnosis present

## 2021-04-22 DIAGNOSIS — E039 Hypothyroidism, unspecified: Secondary | ICD-10-CM | POA: Diagnosis present

## 2021-04-22 DIAGNOSIS — E11621 Type 2 diabetes mellitus with foot ulcer: Secondary | ICD-10-CM | POA: Diagnosis present

## 2021-04-22 DIAGNOSIS — I10 Essential (primary) hypertension: Secondary | ICD-10-CM | POA: Diagnosis present

## 2021-04-22 DIAGNOSIS — E785 Hyperlipidemia, unspecified: Secondary | ICD-10-CM | POA: Diagnosis present

## 2021-04-22 DIAGNOSIS — Z6834 Body mass index (BMI) 34.0-34.9, adult: Secondary | ICD-10-CM

## 2021-04-22 DIAGNOSIS — I480 Paroxysmal atrial fibrillation: Secondary | ICD-10-CM | POA: Diagnosis present

## 2021-04-22 DIAGNOSIS — E1151 Type 2 diabetes mellitus with diabetic peripheral angiopathy without gangrene: Secondary | ICD-10-CM | POA: Diagnosis present

## 2021-04-22 DIAGNOSIS — Z89422 Acquired absence of other left toe(s): Secondary | ICD-10-CM

## 2021-04-22 DIAGNOSIS — D75839 Thrombocytosis, unspecified: Secondary | ICD-10-CM | POA: Diagnosis present

## 2021-04-22 DIAGNOSIS — L02612 Cutaneous abscess of left foot: Secondary | ICD-10-CM | POA: Diagnosis present

## 2021-04-22 DIAGNOSIS — L0390 Cellulitis, unspecified: Secondary | ICD-10-CM | POA: Diagnosis present

## 2021-04-22 DIAGNOSIS — B9562 Methicillin resistant Staphylococcus aureus infection as the cause of diseases classified elsewhere: Secondary | ICD-10-CM | POA: Diagnosis present

## 2021-04-22 DIAGNOSIS — L089 Local infection of the skin and subcutaneous tissue, unspecified: Secondary | ICD-10-CM

## 2021-04-22 DIAGNOSIS — E230 Hypopituitarism: Secondary | ICD-10-CM | POA: Diagnosis present

## 2021-04-22 DIAGNOSIS — M869 Osteomyelitis, unspecified: Secondary | ICD-10-CM | POA: Diagnosis present

## 2021-04-22 DIAGNOSIS — I739 Peripheral vascular disease, unspecified: Secondary | ICD-10-CM | POA: Diagnosis present

## 2021-04-22 DIAGNOSIS — L97329 Non-pressure chronic ulcer of left ankle with unspecified severity: Secondary | ICD-10-CM | POA: Diagnosis present

## 2021-04-22 DIAGNOSIS — Z820 Family history of epilepsy and other diseases of the nervous system: Secondary | ICD-10-CM

## 2021-04-22 DIAGNOSIS — M65972 Unspecified synovitis and tenosynovitis, left ankle and foot: Secondary | ICD-10-CM

## 2021-04-22 DIAGNOSIS — Z7902 Long term (current) use of antithrombotics/antiplatelets: Secondary | ICD-10-CM

## 2021-04-22 DIAGNOSIS — K5900 Constipation, unspecified: Secondary | ICD-10-CM | POA: Diagnosis present

## 2021-04-22 DIAGNOSIS — Z79890 Hormone replacement therapy (postmenopausal): Secondary | ICD-10-CM

## 2021-04-22 DIAGNOSIS — Z87891 Personal history of nicotine dependence: Secondary | ICD-10-CM

## 2021-04-22 DIAGNOSIS — E11628 Type 2 diabetes mellitus with other skin complications: Secondary | ICD-10-CM | POA: Diagnosis present

## 2021-04-22 DIAGNOSIS — Z807 Family history of other malignant neoplasms of lymphoid, hematopoietic and related tissues: Secondary | ICD-10-CM

## 2021-04-22 DIAGNOSIS — Z8249 Family history of ischemic heart disease and other diseases of the circulatory system: Secondary | ICD-10-CM

## 2021-04-22 DIAGNOSIS — Z9861 Coronary angioplasty status: Secondary | ICD-10-CM

## 2021-04-22 DIAGNOSIS — K219 Gastro-esophageal reflux disease without esophagitis: Secondary | ICD-10-CM | POA: Diagnosis present

## 2021-04-22 DIAGNOSIS — Z91018 Allergy to other foods: Secondary | ICD-10-CM

## 2021-04-22 DIAGNOSIS — I2510 Atherosclerotic heart disease of native coronary artery without angina pectoris: Secondary | ICD-10-CM

## 2021-04-22 DIAGNOSIS — Z794 Long term (current) use of insulin: Secondary | ICD-10-CM

## 2021-04-22 DIAGNOSIS — E669 Obesity, unspecified: Secondary | ICD-10-CM | POA: Diagnosis present

## 2021-04-22 DIAGNOSIS — E1169 Type 2 diabetes mellitus with other specified complication: Secondary | ICD-10-CM | POA: Diagnosis present

## 2021-04-22 DIAGNOSIS — Z955 Presence of coronary angioplasty implant and graft: Secondary | ICD-10-CM

## 2021-04-22 DIAGNOSIS — R7881 Bacteremia: Secondary | ICD-10-CM

## 2021-04-22 DIAGNOSIS — Z7901 Long term (current) use of anticoagulants: Secondary | ICD-10-CM

## 2021-04-22 DIAGNOSIS — Z79899 Other long term (current) drug therapy: Secondary | ICD-10-CM

## 2021-04-22 DIAGNOSIS — L97529 Non-pressure chronic ulcer of other part of left foot with unspecified severity: Secondary | ICD-10-CM | POA: Diagnosis present

## 2021-04-22 DIAGNOSIS — L97323 Non-pressure chronic ulcer of left ankle with necrosis of muscle: Secondary | ICD-10-CM | POA: Diagnosis not present

## 2021-04-22 LAB — COMPREHENSIVE METABOLIC PANEL
ALT: 13 U/L (ref 0–44)
AST: 15 U/L (ref 15–41)
Albumin: 2.2 g/dL — ABNORMAL LOW (ref 3.5–5.0)
Alkaline Phosphatase: 87 U/L (ref 38–126)
Anion gap: 10 (ref 5–15)
BUN: 13 mg/dL (ref 8–23)
CO2: 23 mmol/L (ref 22–32)
Calcium: 8.6 mg/dL — ABNORMAL LOW (ref 8.9–10.3)
Chloride: 100 mmol/L (ref 98–111)
Creatinine, Ser: 1.13 mg/dL — ABNORMAL HIGH (ref 0.44–1.00)
GFR, Estimated: 53 mL/min — ABNORMAL LOW (ref >60)
Glucose, Bld: 185 mg/dL — ABNORMAL HIGH (ref 70–99)
Potassium: 4.2 mmol/L (ref 3.5–5.1)
Sodium: 133 mmol/L — ABNORMAL LOW (ref 135–145)
Total Bilirubin: 0.6 mg/dL (ref 0.3–1.2)
Total Protein: 7.9 g/dL (ref 6.5–8.1)

## 2021-04-22 LAB — CBC WITH DIFFERENTIAL/PLATELET
Abs Immature Granulocytes: 0.07 10*3/uL (ref 0.00–0.07)
Basophils Absolute: 0 10*3/uL (ref 0.0–0.1)
Basophils Relative: 0 %
Eosinophils Absolute: 0 10*3/uL (ref 0.0–0.5)
Eosinophils Relative: 0 %
HCT: 30.9 % — ABNORMAL LOW (ref 36.0–46.0)
Hemoglobin: 9.5 g/dL — ABNORMAL LOW (ref 12.0–15.0)
Immature Granulocytes: 1 %
Lymphocytes Relative: 4 %
Lymphs Abs: 0.6 10*3/uL — ABNORMAL LOW (ref 0.7–4.0)
MCH: 27.5 pg (ref 26.0–34.0)
MCHC: 30.7 g/dL (ref 30.0–36.0)
MCV: 89.3 fL (ref 80.0–100.0)
Monocytes Absolute: 0.8 10*3/uL (ref 0.1–1.0)
Monocytes Relative: 6 %
Neutro Abs: 12.2 10*3/uL — ABNORMAL HIGH (ref 1.7–7.7)
Neutrophils Relative %: 89 %
Platelets: 831 10*3/uL — ABNORMAL HIGH (ref 150–400)
RBC: 3.46 MIL/uL — ABNORMAL LOW (ref 3.87–5.11)
RDW: 17.7 % — ABNORMAL HIGH (ref 11.5–15.5)
WBC: 13.7 10*3/uL — ABNORMAL HIGH (ref 4.0–10.5)
nRBC: 0 % (ref 0.0–0.2)

## 2021-04-22 LAB — LACTIC ACID, PLASMA: Lactic Acid, Venous: 1.6 mmol/L (ref 0.5–1.9)

## 2021-04-22 NOTE — Patient Instructions (Signed)
Go to the ER for cellulitis, infection in the left foot and ankle

## 2021-04-22 NOTE — Progress Notes (Signed)
Subjective: 71 year old female presents the office today with her son for concerns of swelling, wound on her left foot as well as ankle.  Patient states that a few weeks ago she had a fall.  She states that she had some swelling and she ended up following up with [name] and x-rays were performed and no evidence of fracture.  She was placed into compression socks.  She states that she wore the socks for couple days and when she remove them she noticed blisters.  She presents today for concerns of infection to the left foot and ankle.  Currently denies any fevers or chills.  Objective: AAO x3, NAD-son is present DP/PT pulses palpable 1/4 bilaterally, CRT less than 3 seconds Sensation decreased with Semmes Weinstein monofilament There is edema and erythema noted to the left foot and ankle.  There is a full-thickness ulceration noted along the lateral aspect of the left ankle with fibrotic tissue.  The wound is quite deep which I am concerned about.  There is no fluctuation or crepitation.  No purulence.  Also there is a wound present along the lateral aspect of the foot proximal to the fifth metatarsal.  There is no probing to bone to this wound. No pain with calf compression, swelling, warmth, erythema         Assessment: Ulceration left foot, ankle with cellulitis  Plan: Reviewed the prior x-rays.  I am very concerned about the wound most on the lateral ankle.  Given the swelling, redness and also her son states that she has not been eating well I recommended going to the emergency room for further evaluation likely admission to the hospital.  I do believe that she needs IV antibiotics and MRI of the left foot and ankle.  If admitted podiatry will follow.  Celesta Gentile, DPM

## 2021-04-22 NOTE — ED Provider Triage Note (Signed)
Emergency Medicine Provider Triage Evaluation Note  Tammy Good , a 71 y.o. female  was evaluated in triage.  Pt complains of left ankle wound.  Patient recently had orthopedic surgery on the left foot and ankle, and has increased pain and swelling.  She saw her podiatrist today who was concerned for infection, especially osteomyelitis.  He sent her for evaluation.  Review of Systems  Positive: Left ankle and foot pain and swelling Negative: Fever, chills  Physical Exam  BP (!) 112/58    Pulse 71    Temp 98 F (36.7 C) (Oral)    Resp 16    SpO2 100%  Gen:   Awake, no distress   Resp:  Normal effort  MSK:   Moves extremities without difficulty  Other:    Medical Decision Making  Medically screening exam initiated at 5:28 PM.  Appropriate orders placed.  Tammy Good was informed that the remainder of the evaluation will be completed by another provider, this initial triage assessment does not replace that evaluation, and the importance of remaining in the ED until their evaluation is complete.     Delesha Pohlman T, PA-C 04/22/21 1729

## 2021-04-22 NOTE — ED Triage Notes (Signed)
Pt c/o left foot pain and swelling with wound to the left foot and one to the left leg. Reports she first noticed wounds 2 weeks ago. Leg wrapped in ace wrap on arrival. Hx diabetes.

## 2021-04-23 ENCOUNTER — Telehealth: Payer: Self-pay | Admitting: Podiatry

## 2021-04-23 ENCOUNTER — Inpatient Hospital Stay (HOSPITAL_COMMUNITY): Payer: Medicare PPO

## 2021-04-23 ENCOUNTER — Emergency Department (HOSPITAL_COMMUNITY): Payer: Medicare PPO

## 2021-04-23 DIAGNOSIS — E1169 Type 2 diabetes mellitus with other specified complication: Secondary | ICD-10-CM

## 2021-04-23 DIAGNOSIS — E785 Hyperlipidemia, unspecified: Secondary | ICD-10-CM | POA: Diagnosis present

## 2021-04-23 DIAGNOSIS — E230 Hypopituitarism: Secondary | ICD-10-CM | POA: Diagnosis present

## 2021-04-23 DIAGNOSIS — Z6834 Body mass index (BMI) 34.0-34.9, adult: Secondary | ICD-10-CM | POA: Diagnosis not present

## 2021-04-23 DIAGNOSIS — Z7901 Long term (current) use of anticoagulants: Secondary | ICD-10-CM | POA: Diagnosis not present

## 2021-04-23 DIAGNOSIS — I739 Peripheral vascular disease, unspecified: Secondary | ICD-10-CM

## 2021-04-23 DIAGNOSIS — L0390 Cellulitis, unspecified: Secondary | ICD-10-CM | POA: Diagnosis present

## 2021-04-23 DIAGNOSIS — I5189 Other ill-defined heart diseases: Secondary | ICD-10-CM | POA: Diagnosis not present

## 2021-04-23 DIAGNOSIS — I2510 Atherosclerotic heart disease of native coronary artery without angina pectoris: Secondary | ICD-10-CM | POA: Diagnosis present

## 2021-04-23 DIAGNOSIS — E11621 Type 2 diabetes mellitus with foot ulcer: Secondary | ICD-10-CM | POA: Diagnosis present

## 2021-04-23 DIAGNOSIS — Z7902 Long term (current) use of antithrombotics/antiplatelets: Secondary | ICD-10-CM | POA: Diagnosis not present

## 2021-04-23 DIAGNOSIS — E039 Hypothyroidism, unspecified: Secondary | ICD-10-CM | POA: Diagnosis present

## 2021-04-23 DIAGNOSIS — D649 Anemia, unspecified: Secondary | ICD-10-CM | POA: Diagnosis present

## 2021-04-23 DIAGNOSIS — A4102 Sepsis due to Methicillin resistant Staphylococcus aureus: Secondary | ICD-10-CM | POA: Diagnosis present

## 2021-04-23 DIAGNOSIS — I10 Essential (primary) hypertension: Secondary | ICD-10-CM

## 2021-04-23 DIAGNOSIS — E1151 Type 2 diabetes mellitus with diabetic peripheral angiopathy without gangrene: Secondary | ICD-10-CM | POA: Diagnosis present

## 2021-04-23 DIAGNOSIS — E8809 Other disorders of plasma-protein metabolism, not elsewhere classified: Secondary | ICD-10-CM | POA: Diagnosis present

## 2021-04-23 DIAGNOSIS — L089 Local infection of the skin and subcutaneous tissue, unspecified: Secondary | ICD-10-CM | POA: Diagnosis not present

## 2021-04-23 DIAGNOSIS — B9562 Methicillin resistant Staphylococcus aureus infection as the cause of diseases classified elsewhere: Secondary | ICD-10-CM | POA: Diagnosis present

## 2021-04-23 DIAGNOSIS — E43 Unspecified severe protein-calorie malnutrition: Secondary | ICD-10-CM | POA: Diagnosis present

## 2021-04-23 DIAGNOSIS — M86172 Other acute osteomyelitis, left ankle and foot: Secondary | ICD-10-CM | POA: Diagnosis not present

## 2021-04-23 DIAGNOSIS — L03116 Cellulitis of left lower limb: Secondary | ICD-10-CM | POA: Diagnosis present

## 2021-04-23 DIAGNOSIS — R7881 Bacteremia: Secondary | ICD-10-CM | POA: Diagnosis not present

## 2021-04-23 DIAGNOSIS — E11628 Type 2 diabetes mellitus with other skin complications: Secondary | ICD-10-CM | POA: Diagnosis present

## 2021-04-23 DIAGNOSIS — L02612 Cutaneous abscess of left foot: Secondary | ICD-10-CM | POA: Diagnosis present

## 2021-04-23 DIAGNOSIS — E669 Obesity, unspecified: Secondary | ICD-10-CM

## 2021-04-23 DIAGNOSIS — Z9861 Coronary angioplasty status: Secondary | ICD-10-CM

## 2021-04-23 DIAGNOSIS — M869 Osteomyelitis, unspecified: Secondary | ICD-10-CM | POA: Diagnosis present

## 2021-04-23 DIAGNOSIS — I480 Paroxysmal atrial fibrillation: Secondary | ICD-10-CM | POA: Diagnosis present

## 2021-04-23 DIAGNOSIS — D75839 Thrombocytosis, unspecified: Secondary | ICD-10-CM

## 2021-04-23 DIAGNOSIS — Z20822 Contact with and (suspected) exposure to covid-19: Secondary | ICD-10-CM | POA: Diagnosis present

## 2021-04-23 DIAGNOSIS — L97529 Non-pressure chronic ulcer of other part of left foot with unspecified severity: Secondary | ICD-10-CM | POA: Diagnosis present

## 2021-04-23 DIAGNOSIS — I700 Atherosclerosis of aorta: Secondary | ICD-10-CM | POA: Diagnosis not present

## 2021-04-23 DIAGNOSIS — L97329 Non-pressure chronic ulcer of left ankle with unspecified severity: Secondary | ICD-10-CM | POA: Diagnosis present

## 2021-04-23 DIAGNOSIS — R601 Generalized edema: Secondary | ICD-10-CM | POA: Diagnosis not present

## 2021-04-23 DIAGNOSIS — M659 Synovitis and tenosynovitis, unspecified: Secondary | ICD-10-CM | POA: Diagnosis present

## 2021-04-23 LAB — URINALYSIS, MICROSCOPIC (REFLEX)

## 2021-04-23 LAB — HEMOGLOBIN AND HEMATOCRIT, BLOOD
HCT: 28 % — ABNORMAL LOW (ref 36.0–46.0)
HCT: 37 % (ref 36.0–46.0)
Hemoglobin: 8.4 g/dL — ABNORMAL LOW (ref 12.0–15.0)
Hemoglobin: 11.4 g/dL — ABNORMAL LOW (ref 12.0–15.0)

## 2021-04-23 LAB — BLOOD CULTURE ID PANEL (REFLEXED) - BCID2

## 2021-04-23 LAB — URINALYSIS, ROUTINE W REFLEX MICROSCOPIC
Bilirubin Urine: NEGATIVE
Glucose, UA: >=500 mg/dL — AB
Hgb urine dipstick: NEGATIVE
Ketones, ur: NEGATIVE mg/dL
Nitrite: NEGATIVE
Protein, ur: NEGATIVE mg/dL
Specific Gravity, Urine: <1.005 — ABNORMAL LOW (ref 1.005–1.030)
pH: 5.5 (ref 5.0–8.0)

## 2021-04-23 LAB — APTT
aPTT: 43 seconds — ABNORMAL HIGH (ref 24–36)
aPTT: 73 seconds — ABNORMAL HIGH (ref 24–36)

## 2021-04-23 LAB — HIV ANTIBODY (ROUTINE TESTING W REFLEX): HIV Screen 4th Generation wRfx: NONREACTIVE

## 2021-04-23 LAB — HEMOGLOBIN A1C
Hgb A1c MFr Bld: 7.2 % — ABNORMAL HIGH (ref 4.8–5.6)
Mean Plasma Glucose: 159.94 mg/dL

## 2021-04-23 LAB — RESP PANEL BY RT-PCR (FLU A&B, COVID) ARPGX2
Influenza A by PCR: NEGATIVE
Influenza B by PCR: NEGATIVE
SARS Coronavirus 2 by RT PCR: NEGATIVE

## 2021-04-23 LAB — SEDIMENTATION RATE: Sed Rate: 140 mm/hr — ABNORMAL HIGH (ref 0–22)

## 2021-04-23 LAB — C-REACTIVE PROTEIN: CRP: 27.7 mg/dL — ABNORMAL HIGH (ref <1.0)

## 2021-04-23 LAB — POC OCCULT BLOOD, ED: Fecal Occult Bld: NEGATIVE

## 2021-04-23 LAB — CBG MONITORING, ED
Glucose-Capillary: 105 mg/dL — ABNORMAL HIGH (ref 70–99)
Glucose-Capillary: 161 mg/dL — ABNORMAL HIGH (ref 70–99)
Glucose-Capillary: 137 mg/dL — ABNORMAL HIGH (ref 70–99)

## 2021-04-23 LAB — PREALBUMIN: Prealbumin: 5.5 mg/dL — ABNORMAL LOW (ref 18–38)

## 2021-04-23 LAB — HEPARIN LEVEL (UNFRACTIONATED): Heparin Unfractionated: >1.1 IU/mL — ABNORMAL HIGH (ref 0.30–0.70)

## 2021-04-23 MED ORDER — INSULIN GLARGINE-YFGN 100 UNIT/ML ~~LOC~~ SOLN
23.0000 [IU] | Freq: Every day | SUBCUTANEOUS | Status: DC
  Administered 2021-04-23 – 2021-04-26 (×3): 23 [IU] via SUBCUTANEOUS
  Filled 2021-04-23 (×5): qty 0.23

## 2021-04-23 MED ORDER — DEXAMETHASONE 0.5 MG PO TABS
0.5000 mg | ORAL_TABLET | Freq: Every morning | ORAL | Status: DC
  Administered 2021-04-23 – 2021-04-27 (×5): 0.5 mg via ORAL
  Filled 2021-04-23 (×6): qty 1

## 2021-04-23 MED ORDER — LEVOTHYROXINE SODIUM 25 MCG PO TABS
125.0000 ug | ORAL_TABLET | Freq: Every day | ORAL | Status: DC
  Administered 2021-04-24 – 2021-04-27 (×4): 125 ug via ORAL
  Filled 2021-04-23 (×4): qty 1

## 2021-04-23 MED ORDER — VANCOMYCIN HCL 1500 MG/300ML IV SOLN
1500.0000 mg | Freq: Once | INTRAVENOUS | Status: AC
  Administered 2021-04-23: 1500 mg via INTRAVENOUS
  Filled 2021-04-23: qty 300

## 2021-04-23 MED ORDER — SODIUM CHLORIDE 0.9 % IV SOLN: 2.0000 g | Freq: Two times a day (BID) | INTRAVENOUS | Status: DC

## 2021-04-23 MED ORDER — METOPROLOL SUCCINATE ER 25 MG PO TB24
25.0000 mg | ORAL_TABLET | Freq: Two times a day (BID) | ORAL | Status: DC
  Administered 2021-04-23 – 2021-04-27 (×6): 25 mg via ORAL
  Filled 2021-04-23 (×7): qty 1

## 2021-04-23 MED ORDER — HYDROCODONE-ACETAMINOPHEN 5-325 MG PO TABS
1.0000 | ORAL_TABLET | Freq: Four times a day (QID) | ORAL | Status: DC | PRN
  Administered 2021-04-25 – 2021-04-27 (×6): 1 via ORAL
  Filled 2021-04-23 (×6): qty 1

## 2021-04-23 MED ORDER — SPIRONOLACTONE 25 MG PO TABS
25.0000 mg | ORAL_TABLET | Freq: Every morning | ORAL | Status: DC
  Administered 2021-04-24 – 2021-04-27 (×4): 25 mg via ORAL
  Filled 2021-04-23 (×5): qty 1

## 2021-04-23 MED ORDER — PIPERACILLIN-TAZOBACTAM 3.375 G IVPB 30 MIN
3.3750 g | Freq: Once | INTRAVENOUS | Status: AC
  Administered 2021-04-23: 3.375 g via INTRAVENOUS
  Filled 2021-04-23: qty 50

## 2021-04-23 MED ORDER — SODIUM CHLORIDE 0.9 % IV BOLUS
1000.0000 mL | Freq: Once | INTRAVENOUS | Status: AC
  Administered 2021-04-23: 1000 mL via INTRAVENOUS

## 2021-04-23 MED ORDER — METRONIDAZOLE 500 MG PO TABS
500.0000 mg | ORAL_TABLET | Freq: Two times a day (BID) | ORAL | Status: DC
  Administered 2021-04-23: 500 mg via ORAL
  Filled 2021-04-23: qty 1

## 2021-04-23 MED ORDER — INSULIN ASPART 100 UNIT/ML IJ SOLN
0.0000 [IU] | Freq: Three times a day (TID) | INTRAMUSCULAR | Status: DC
  Administered 2021-04-23 – 2021-04-24 (×2): 2 [IU] via SUBCUTANEOUS
  Administered 2021-04-25 (×3): 3 [IU] via SUBCUTANEOUS
  Administered 2021-04-26: 2 [IU] via SUBCUTANEOUS
  Administered 2021-04-26: 3 [IU] via SUBCUTANEOUS
  Administered 2021-04-27: 2 [IU] via SUBCUTANEOUS

## 2021-04-23 MED ORDER — LOSARTAN POTASSIUM 50 MG PO TABS
25.0000 mg | ORAL_TABLET | Freq: Every morning | ORAL | Status: DC
  Administered 2021-04-24 – 2021-04-27 (×4): 25 mg via ORAL
  Filled 2021-04-23 (×4): qty 1

## 2021-04-23 MED ORDER — HEPARIN (PORCINE) 25000 UT/250ML-% IV SOLN
1450.0000 [IU]/h | INTRAVENOUS | Status: DC
  Administered 2021-04-23: 1350 [IU]/h via INTRAVENOUS
  Administered 2021-04-25 – 2021-04-26 (×4): 1450 [IU]/h via INTRAVENOUS
  Filled 2021-04-23 (×5): qty 250

## 2021-04-23 MED ORDER — PANTOPRAZOLE SODIUM 40 MG PO TBEC
40.0000 mg | DELAYED_RELEASE_TABLET | Freq: Two times a day (BID) | ORAL | Status: DC
  Administered 2021-04-23 – 2021-04-27 (×7): 40 mg via ORAL
  Filled 2021-04-23 (×7): qty 1

## 2021-04-23 MED ORDER — VANCOMYCIN HCL IN DEXTROSE 1-5 GM/200ML-% IV SOLN: 1000.0000 mg | INTRAVENOUS | Status: DC

## 2021-04-23 MED ORDER — ATORVASTATIN CALCIUM 40 MG PO TABS
40.0000 mg | ORAL_TABLET | Freq: Every day | ORAL | Status: DC
  Administered 2021-04-23 – 2021-04-26 (×3): 40 mg via ORAL
  Filled 2021-04-23 (×3): qty 1

## 2021-04-23 MED ORDER — SODIUM CHLORIDE 0.9% FLUSH
3.0000 mL | Freq: Two times a day (BID) | INTRAVENOUS | Status: DC
  Administered 2021-04-23 – 2021-04-26 (×5): 3 mL via INTRAVENOUS

## 2021-04-23 NOTE — Progress Notes (Signed)
ANTICOAGULATION CONSULT NOTE - Initial Consult  Pharmacy Consult for IV heparin Indication: atrial fibrillation  Allergies  Allergen Reactions   Strawberry Extract Itching, Swelling and Anaphylaxis    Mouth swells and gets itchy    Patient Measurements:   Heparin Dosing Weight: 83.9 kg  Vital Signs: BP: 135/46 (01/31 1230) Pulse Rate: 82 (01/31 1230)  Labs: Recent Labs    04/22/21 1739 04/23/21 1241  HGB 9.5* 8.4*  HCT 30.9* 28.0*  PLT 831*  --   CREATININE 1.13*  --     Estimated Creatinine Clearance: 57.9 mL/min (A) (by C-G formula based on SCr of 1.13 mg/dL (H)).   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   Hyperlipidemia with target LDL less than 70    Hypothyroidism (acquired)    Mild essential hypertension    Obesity (BMI 30-39.9) 11/17/2013   PAD (peripheral artery disease) ([place]) 05/26/2019   05/26/19: Abd AoGram- BLE runoff -> L SFA orbital atherectomy - PTA w/ DEB & Stent 6 x 40 Luttonix (for focal dissection) - patent Pop A with 3 V runoff. LEA Dopplers 01/03/2020: RABI (prev) 0.68 (0.53)/ RTBI (prev) 0.58 (0.33); LABI (prev) 0.80 (0.64), LTBI (prev) 0.64 (0.51); R mSFA ~50-74%, L mSFA 50-74%. Patent Prox SFA stent < 49% stenosis   Panhypopituitarism (HCC)    ST elevation myocardial infarction (STEMI) of inferior wall, subsequent episode of care ([place]) 08/2013   80% branch of D1, 40% mid AV groove circumflex, 100% RCA with subacute thrombus -- thrombus extending into RPA V with 100% occlusion after initial angioplasty of mid RCA ;; Post MI ECHO  6/9/'15: EF 50-55%, mild LVH with moderate HK of inferior wall, Gr1 DD, mild LA dilation; mildly reduced RV function   Assessment: 12 YOF presenting with foot infection. PMH significant for A-fib anticoagulated on apixaban. Last dose taken 1/30 @1000 . Pharmacy to dose IV heparin.  Hgb slightly decreased at 9.5, platelets are within normal limits. Renal function appears to be at baseline. Will need to trend aPTT and heparin level until both correlate.  Goal of Therapy:  Heparin level 0.3-0.7 units/ml aPTT 66-102 seconds Monitor platelets by anticoagulation protocol: Yes   Plan:  Start IV heparin 1350 units/h F/u heparin level and aPTT until both correlate 8h aPTT, heparin level Daily aPTT, heparin level, CBC Monitor for signs and symptoms of bleeding F/u restart home Eliquis  Thank you for involving pharmacy in this patient's care.  Elita Quick, PharmD PGY1 Ambulatory Care Pharmacy Resident 04/23/2021 1:48 PM  **Pharmacist phone directory can be found on [email or web address] listed under [place]**

## 2021-04-23 NOTE — Consult Note (Addendum)
WOC Nurse Consult Note: Reason for Consult: Consult requested for left foot wound; performed remotely after review of progress notes and photos in the EMR.   Pt was seen by Dr [name] of the podiatry team yesterday, and he recommended the patient be admitted for cellulitis and IV antibiotics, and stated he would continue to cover the patient while she was in the hospital. Left outer ankle with generalized edema and dry cracked peeling skin, left outer foot with full thickness wound, 2X2.4, according to bedside nurses' notes, 100% yellow slough protruding from the wound bed.  Left outer foot with smaller full thickness wound, 50% red, 50% yellow Dressing procedure/placement/frequency: Topical treatment orders provided for bedside nurses to perform as follows: Apply a piece of Aquacel Kellie Simmering # [phone]) to left foot/leg wounds Q day, then cover with foam dressing and ace wrap from behind toes to below knee; until further orders are available from podiatry service.  Please re-consult if further assistance is needed.  Thank-you,  Julien Girt MSN, [place], [place], [place], [place]

## 2021-04-23 NOTE — ED Notes (Signed)
Pt reports she has not taken her iron in "a while" d/t "all the other things going on."

## 2021-04-23 NOTE — Telephone Encounter (Signed)
Patient was sent to [facility] ER yesterday and she still has not received any type of treatment. She has not been put in a regular room she is still in the ER  Please advise

## 2021-04-23 NOTE — Consult Note (Signed)
[facility] for Infectious Disease    Date of Admission:  04/22/2021     Total days of antibiotics 1  Vancomycin  Zosyn       Reason for Consult: MRSA bacteremia     Referring Provider: autoconsult Primary Care Provider: [name], MD   Assessment: Tammy Good is a 71 y.o. female sent to the ER for admission regarding worsening of a left lateral ankle ulceration with surrounding cellulitis. In the ER she was found to have MRSA bacteremia, likely from her foot/ankle infection.  MRI of the foot/ankle with no OM of the foot, possible early OM with marrow edema to the fibula adjacent to fluid collected around peroneal tendon. Podiatry will be following the patient inpatient to help with any further surgical / wound recommendations. Not a tobacco user. Has adequate blood flow based off recent aortogram.   Will obtain TTE; likely needs TEE to ensure chronic foot wound has not seeded native heart valves. This will help determine length of therapy.   At this time no other signs of metastatic infection. Continue vancomycin alone, stop other antibiotics.    Diabetes - A1C relatively well controlled 7.2%.    Plan: Vancomycin IV alone TTE, will need TEE if negative/inconclusive  Hold on Midline/picc for now Repeat blood cultures > 24h on abx   Follow podiatry assessment and treatment plan    Principal Problem:   Cellulitis    insulin aspart  0-15 Units Subcutaneous TID WC   metroNIDAZOLE  500 mg Oral Q12H   sodium chloride flush  3 mL Intravenous Q12H    HPI: Tammy Good is a 71 y.o. female sent to the hospital for admission and IV antibiotics after being seen by podiatry yesterday with concern over left lateral malleolar cellulitis of a purulent draining wound/ulcer.   Tammy Good says she has had pain that limits her ability to bear weight for nearly a month now. Has been working with podiatry ([name]) and ortho ([name]) with her foot wounds.  Recently underwent aortogram with [name] where no arterial intervention was needed and previously placed stents were patent.   She has not had any fevers, chills, malaise over the last few weeks but has had progressively more and more pain in the left heel at the base of the tendons posteriorly to her ankle. She was seen in the ER and found to have leukocytosis, Creatinine 1.13,  and MRSA growing in one of her blood cultures. Overall hemodynamics stable. Only complaint outside of her ankle/heel pain is increased urination after IV fluids in ER. She has no orthopedic or cardiovascular hardware (aside from 3 stents).  No allergies to antibiotics.  Lives locally. No tobacco use currently (all remote).     Review of Systems: Review of Systems  Constitutional:  Negative for chills, fever, malaise/fatigue and weight loss.  HENT:  Negative for sore throat.   Respiratory:  Negative for cough and sputum production.   Cardiovascular:  Positive for leg swelling (left leg). Negative for chest pain.  Gastrointestinal:  Negative for abdominal pain, diarrhea and vomiting.  Genitourinary:  Negative for dysuria and flank pain.  Musculoskeletal:  Positive for joint pain (Left foot pain). Negative for myalgias and neck pain.  Skin:  Negative for rash.       Foot wounds x 2 left foot  Neurological:  Negative for dizziness, tingling and headaches.   Past Medical History:  Diagnosis Date  CAD S/P percutaneous coronary angioplasty 08/2013   100% mRCA - PCI Integrity Resolute DES 3.0 mm x 38 mm - 3.35 mm; PTCA of RPA V 2.0 mm x 15 mm   Cholesteatoma    right   Diabetes mellitus type 2 in obese (HCC)    On insulin and Invokana   History of osteomyelitis L 5th Toe all 05/2019   s/p Partial Ray Amputation with partial closure; 6 wks Abx & LSFA Atherectomy/DEB PTA with Stent for focal dissection.   Hyperlipidemia with target LDL less than 70    Hypothyroidism (acquired)    Mild essential hypertension     Obesity (BMI 30-39.9) 11/17/2013   PAD (peripheral artery disease) ([place]) 05/26/2019   05/26/19: Abd AoGram- BLE runoff -> L SFA orbital atherectomy - PTA w/ DEB & Stent 6 x 40 Luttonix (for focal dissection) - patent Pop A with 3 V runoff. LEA Dopplers 01/03/2020: RABI (prev) 0.68 (0.53)/ RTBI (prev) 0.58 (0.33); LABI (prev) 0.80 (0.64), LTBI (prev) 0.64 (0.51); R mSFA ~50-74%, L mSFA 50-74%. Patent Prox SFA stent < 49% stenosis   Panhypopituitarism (HCC)    ST elevation myocardial infarction (STEMI) of inferior wall, subsequent episode of care ([place]) 08/2013   80% branch of D1, 40% mid AV groove circumflex, 100% RCA with subacute thrombus -- thrombus extending into RPA V with 100% occlusion after initial angioplasty of mid RCA ;; Post MI ECHO 6/9/'15: EF 50-55%, mild LVH with moderate HK of inferior wall, Gr1 DD, mild LA dilation; mildly reduced RV function    Social History   Tobacco Use   Smoking status: Former   Smokeless tobacco: Never  Substance Use Topics   Alcohol use: No   Drug use: No    Family History  Problem Relation Age of Onset   Cancer Mother 54       multiple myeloma   Heart attack Father 55   Cancer Sister    Alzheimer's disease Maternal Grandmother    Allergies  Allergen Reactions   Strawberry Extract Itching, Swelling and Anaphylaxis    Mouth swells and gets itchy    OBJECTIVE: Blood pressure (!) 135/46, pulse 82, temperature 98 F (36.7 C), temperature source Oral, resp. rate (!) 25, SpO2 100 %.  Physical Exam Vitals reviewed.  Constitutional:      Appearance: She is well-developed.     Comments: Seated comfortably in chair.   HENT:     Mouth/Throat:     Mouth: No oral lesions.     Dentition: Normal dentition. No dental abscesses.     Pharynx: No oropharyngeal exudate.  Cardiovascular:     Rate and Rhythm: Normal rate and regular rhythm.     Heart sounds: Normal heart sounds.  Pulmonary:     Effort: Pulmonary effort is normal.     Breath sounds: Normal  breath sounds.  Abdominal:     General: There is no distension.     Palpations: Abdomen is soft.     Tenderness: There is no abdominal tenderness.  Musculoskeletal:     Comments: Left foot examined - red/warm swollen skin surrounding the lateral ankle ulcer; white material in wound bed as pictured, did not unwrap as this was recently put on and clean. Foot without any plantar wounds; no wounds to skin overlying pain at the heel/posterior ankle.   Lymphadenopathy:     Cervical: No cervical adenopathy.  Skin:    General: Skin is warm and dry.     Findings: No rash.  Neurological:  Mental Status: She is alert and oriented to person, place, and time.  Psychiatric:        Judgment: Judgment normal.    Lab Results Lab Results  Component Value Date   WBC 13.7 (H) 04/22/2021   HGB 8.4 (L) 04/23/2021   HCT 28.0 (L) 04/23/2021   MCV 89.3 04/22/2021   PLT 831 (H) 04/22/2021    Lab Results  Component Value Date   CREATININE 1.13 (H) 04/22/2021   BUN 13 04/22/2021   NA 133 (L) 04/22/2021   K 4.2 04/22/2021   CL 100 04/22/2021   CO2 23 04/22/2021    Lab Results  Component Value Date   ALT 13 04/22/2021   AST 15 04/22/2021   ALKPHOS 87 04/22/2021   BILITOT 0.6 04/22/2021     Microbiology: Recent Results (from the past 240 hour(s))  Blood culture (routine x 2)     Status: None (Preliminary result)   Collection Time: 04/22/21  5:33 PM   Specimen: BLOOD  Result Value Ref Range Status   Specimen Description BLOOD SITE NOT SPECIFIED  Final   Special Requests   Final    BOTTLES DRAWN AEROBIC AND ANAEROBIC Blood Culture adequate volume   Culture  Setup Time   Final    GRAM POSITIVE COCCI IN CLUSTERS AEROBIC BOTTLE ONLY CRITICAL RESULT CALLED TO, READ BACK BY AND VERIFIED WITH: [place] F. [id number] FCP Performed at [facility], [place] [address], [place], [place] [address]    Culture GRAM POSITIVE COCCI  Final   Report Status PENDING  Incomplete  Blood Culture ID  Panel (Reflexed)     Status: Abnormal   Collection Time: 04/22/21  5:33 PM  Result Value Ref Range Status   Enterococcus faecalis NOT DETECTED NOT DETECTED Final   Enterococcus Faecium NOT DETECTED NOT DETECTED Final   Listeria monocytogenes NOT DETECTED NOT DETECTED Final   Staphylococcus species DETECTED (A) NOT DETECTED Final    Comment: CRITICAL RESULT CALLED TO, READ BACK BY AND VERIFIED WITH: PHARMD JEREMY F. [id number] FCP    Staphylococcus aureus (BCID) DETECTED (A) NOT DETECTED Final    Comment: Methicillin (oxacillin)-resistant Staphylococcus aureus (MRSA). MRSA is predictably resistant to beta-lactam antibiotics (except ceftaroline). Preferred therapy is vancomycin unless clinically contraindicated. Patient requires contact precautions if  hospitalized. CRITICAL RESULT CALLED TO, READ BACK BY AND VERIFIED WITH: PHARMD JEREMY F. [phone] FCP    Staphylococcus epidermidis NOT DETECTED NOT DETECTED Final   Staphylococcus lugdunensis NOT DETECTED NOT DETECTED Final   Streptococcus species NOT DETECTED NOT DETECTED Final   Streptococcus agalactiae NOT DETECTED NOT DETECTED Final   Streptococcus pneumoniae NOT DETECTED NOT DETECTED Final   Streptococcus pyogenes NOT DETECTED NOT DETECTED Final   A.calcoaceticus-baumannii NOT DETECTED NOT DETECTED Final   Bacteroides fragilis NOT DETECTED NOT DETECTED Final   Enterobacterales NOT DETECTED NOT DETECTED Final   Enterobacter cloacae complex NOT DETECTED NOT DETECTED Final   Escherichia coli NOT DETECTED NOT DETECTED Final   Klebsiella aerogenes NOT DETECTED NOT DETECTED Final   Klebsiella oxytoca NOT DETECTED NOT DETECTED Final   Klebsiella pneumoniae NOT DETECTED NOT DETECTED Final   Proteus species NOT DETECTED NOT DETECTED Final   Salmonella species NOT DETECTED NOT DETECTED Final   Serratia marcescens NOT DETECTED NOT DETECTED Final   Haemophilus influenzae NOT DETECTED NOT DETECTED Final   Neisseria meningitidis NOT  DETECTED NOT DETECTED Final   Pseudomonas aeruginosa NOT DETECTED NOT DETECTED Final   Stenotrophomonas maltophilia NOT  DETECTED NOT DETECTED Final   Candida albicans NOT DETECTED NOT DETECTED Final   Candida auris NOT DETECTED NOT DETECTED Final   Candida glabrata NOT DETECTED NOT DETECTED Final   Candida krusei NOT DETECTED NOT DETECTED Final   Candida parapsilosis NOT DETECTED NOT DETECTED Final   Candida tropicalis NOT DETECTED NOT DETECTED Final   Cryptococcus neoformans/gattii NOT DETECTED NOT DETECTED Final   Meth resistant mecA/C and MREJ DETECTED (A) NOT DETECTED Final    Comment: CRITICAL RESULT CALLED TO, READ BACK BY AND VERIFIED WITH: [place] F. [id number] FCP Performed at [facility], [address], [place], [place] [address]   Resp Panel by RT-PCR (Flu A&B, Covid) Nasopharyngeal Swab     Status: None   Collection Time: 04/23/21  9:08 AM   Specimen: Nasopharyngeal Swab; Nasopharyngeal(NP) swabs in vial transport medium  Result Value Ref Range Status   SARS Coronavirus 2 by RT PCR NEGATIVE NEGATIVE Final    Comment: (NOTE) SARS-CoV-2 target nucleic acids are NOT DETECTED.  The SARS-CoV-2 RNA is generally detectable in upper respiratory specimens during the acute phase of infection. The lowest concentration of SARS-CoV-2 viral copies this assay can detect is 138 copies/mL. A negative result does not preclude SARS-Cov-2 infection and should not be used as the sole basis for treatment or other patient management decisions. A negative result may occur with  improper specimen collection/handling, submission of specimen other than nasopharyngeal swab, presence of viral mutation(s) within the areas targeted by this assay, and inadequate number of viral copies(<138 copies/mL). A negative result must be combined with clinical observations, patient history, and epidemiological information. The expected result is Negative.  Fact Sheet for Patients:   EntrepreneurPulse.com.au  Fact Sheet for Healthcare Providers:  IncredibleEmployment.be  This test is no t yet approved or cleared by the [place] FDA and  has been authorized for detection and/or diagnosis of SARS-CoV-2 by FDA under an Emergency Use Authorization (EUA). This EUA will remain  in effect (meaning this test can be used) for the duration of the COVID-19 declaration under Section 564(b)(1) of the Act, 21 U.S.C.section 360bbb-3(b)(1), unless the authorization is terminated  or revoked sooner.       Influenza A by PCR NEGATIVE NEGATIVE Final   Influenza B by PCR NEGATIVE NEGATIVE Final    Comment: (NOTE) The Xpert Xpress SARS-CoV-2/FLU/RSV plus assay is intended as an aid in the diagnosis of influenza from Nasopharyngeal swab specimens and should not be used as a sole basis for treatment. Nasal washings and aspirates are unacceptable for Xpert Xpress SARS-CoV-2/FLU/RSV testing.  Fact Sheet for Patients: EntrepreneurPulse.com.au  Fact Sheet for Healthcare Providers: IncredibleEmployment.be  This test is not yet approved or cleared by the [place] FDA and has been authorized for detection and/or diagnosis of SARS-CoV-2 by FDA under an Emergency Use Authorization (EUA). This EUA will remain in effect (meaning this test can be used) for the duration of the COVID-19 declaration under Section 564(b)(1) of the Act, 21 U.S.C. section 360bbb-3(b)(1), unless the authorization is terminated or revoked.  Performed at [facility], [place] [address] [place], [place] [address]     Janene Madeira, MSN, NP-C [facility] for Infectious [place] Pager: [phone]  04/23/2021 1:38 PM

## 2021-04-23 NOTE — Progress Notes (Signed)
PHARMACY - PHYSICIAN COMMUNICATION CRITICAL VALUE ALERT - BLOOD CULTURE IDENTIFICATION (BCID)  Tammy Good is an 71 y.o. female who presented to [facility] on 04/22/2021 with a chief complaint of left foot pain and swelling.  Assessment:  MRSA identified in one set of blood cultures so far.  Source appears to be her foot  Name of physician (or Provider) Contacted: Dr. Tamala Julian and ID team  Current antibiotics: Vancomycin, cefepime, metronidazole  Changes to prescribed antibiotics recommended: ID team to see and make antibiotic changes   Results for orders placed or performed during the hospital encounter of 04/22/21  Blood Culture ID Panel (Reflexed) (Collected: 04/22/2021  5:33 PM)  Result Value Ref Range   Enterococcus faecalis NOT DETECTED NOT DETECTED   Enterococcus Faecium NOT DETECTED NOT DETECTED   Listeria monocytogenes NOT DETECTED NOT DETECTED   Staphylococcus species DETECTED (A) NOT DETECTED   Staphylococcus aureus (BCID) DETECTED (A) NOT DETECTED   Staphylococcus epidermidis NOT DETECTED NOT DETECTED   Staphylococcus lugdunensis NOT DETECTED NOT DETECTED   Streptococcus species NOT DETECTED NOT DETECTED   Streptococcus agalactiae NOT DETECTED NOT DETECTED   Streptococcus pneumoniae NOT DETECTED NOT DETECTED   Streptococcus pyogenes NOT DETECTED NOT DETECTED   A.calcoaceticus-baumannii NOT DETECTED NOT DETECTED   Bacteroides fragilis NOT DETECTED NOT DETECTED   Enterobacterales NOT DETECTED NOT DETECTED   Enterobacter cloacae complex NOT DETECTED NOT DETECTED   Escherichia coli NOT DETECTED NOT DETECTED   Klebsiella aerogenes NOT DETECTED NOT DETECTED   Klebsiella oxytoca NOT DETECTED NOT DETECTED   Klebsiella pneumoniae NOT DETECTED NOT DETECTED   Proteus species NOT DETECTED NOT DETECTED   Salmonella species NOT DETECTED NOT DETECTED   Serratia marcescens NOT DETECTED NOT DETECTED   Haemophilus influenzae NOT DETECTED NOT DETECTED   Neisseria meningitidis NOT  DETECTED NOT DETECTED   Pseudomonas aeruginosa NOT DETECTED NOT DETECTED   Stenotrophomonas maltophilia NOT DETECTED NOT DETECTED   Candida albicans NOT DETECTED NOT DETECTED   Candida auris NOT DETECTED NOT DETECTED   Candida glabrata NOT DETECTED NOT DETECTED   Candida krusei NOT DETECTED NOT DETECTED   Candida parapsilosis NOT DETECTED NOT DETECTED   Candida tropicalis NOT DETECTED NOT DETECTED   Cryptococcus neoformans/gattii NOT DETECTED NOT DETECTED   Meth resistant mecA/C and MREJ DETECTED (A) NOT DETECTED    Candie [place] 04/23/2021  1:46 PM

## 2021-04-23 NOTE — Progress Notes (Signed)
ANTICOAGULATION CONSULT NOTE - Initial Consult  Pharmacy Consult for IV heparin Indication: atrial fibrillation  Allergies  Allergen Reactions   Strawberry Extract Itching, Swelling and Anaphylaxis    Mouth swells and gets itchy    Patient Measurements:   Heparin Dosing Weight: 83.9 kg  Vital Signs: Temp: 98.7 F (37.1 C) (01/31 2036) Temp Source: Oral (01/31 2036) BP: 149/54 (01/31 2200) Pulse Rate: 83 (01/31 2200)  Labs: Recent Labs    04/22/21 1739 04/23/21 1241 04/23/21 1453 04/23/21 1530 04/23/21 2154  HGB 9.5* 8.4*  --  11.4*  --   HCT 30.9* 28.0*  --  37.0  --   PLT 831*  --   --   --   --   APTT  --   --  43*  --  73*  HEPARINUNFRC  --   --  >1.10*  --   --   CREATININE 1.13*  --   --   --   --      Estimated Creatinine Clearance: 57.9 mL/min (A) (by C-G formula based on SCr of 1.13 mg/dL (H)).   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   Hyperlipidemia with target LDL less than 70    Hypothyroidism (acquired)    Mild essential hypertension    Obesity (BMI 30-39.9) 11/17/2013   PAD (peripheral artery disease) ([place]) 05/26/2019   05/26/19: Abd AoGram- BLE runoff -> L SFA orbital atherectomy - PTA w/ DEB & Stent 6 x 40 Luttonix (for focal dissection) - patent Pop A with 3 V runoff. LEA Dopplers 01/03/2020: RABI (prev) 0.68 (0.53)/ RTBI (prev) 0.58 (0.33); LABI (prev) 0.80 (0.64), LTBI (prev) 0.64 (0.51); R mSFA ~50-74%, L mSFA 50-74%. Patent Prox SFA stent < 49% stenosis   Panhypopituitarism (HCC)    ST elevation myocardial infarction  (STEMI) of inferior wall, subsequent episode of care ([place]) 08/2013   80% branch of D1, 40% mid AV groove circumflex, 100% RCA with subacute thrombus -- thrombus extending into RPA V with 100% occlusion after initial angioplasty of mid RCA ;; Post MI ECHO 6/9/'15: EF 50-55%, mild LVH with moderate HK of inferior wall, Gr1 DD, mild LA dilation; mildly reduced RV function   Assessment: 50 YOF presenting with foot infection. PMH significant for A-fib anticoagulated on apixaban. Last dose taken 1/30 @1000 . Pharmacy to dose IV heparin.  Aptt 73 sec (on 1350 units/hr)  Goal of Therapy:  Heparin level 0.3-0.7 units/ml aPTT 66-102 seconds Monitor platelets by anticoagulation protocol: Yes   Plan:  Continue heparin 1350 units/h F/u heparin level and aPTT until both correlate Daily aPTT, heparin level, CBC Monitor for signs and symptoms of bleeding F/u restart home Eliquis  Thank you for allowing pharmacy to be a part of this patients care.  Donnald Garre, PharmD Clinical Pharmacist  Please check AMION for all [facility] numbers After 10:00 PM, call [facility] [phone]

## 2021-04-23 NOTE — ED Notes (Signed)
Pt was incontinent of urine, I cleaned pt and applied a clean brief, bedding and gown.

## 2021-04-23 NOTE — ED Provider Notes (Signed)
[facility] EMERGENCY DEPARTMENT Provider Note   CSN: [id number] Arrival date & time: 04/22/21  1625     History  Chief Complaint  Patient presents with   Wound Infection    Tammy Good is a 71 y.o. female.  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  Is followed by Dr. Earleen Newport of podiatry.  She states she has mild pain in her left foot.  Denies any nausea or vomiting or fevers.  No chest pain difficulty breathing.  No other associate symptoms.     Home Medications Prior to Admission medications   Medication Sig Start Date End Date Taking? Authorizing Provider  acetaminophen (TYLENOL) 500 MG tablet Take 500-1,000 mg by mouth 3 (three) times daily as needed for headache (pain).   Yes [provider]  apixaban (ELIQUIS) 5 MG TABS tablet Take 1 tablet (5 mg total) by mouth 2 (two) times daily. 11/21/20  Yes Baglia, Corrina, PA-C  atorvastatin (LIPITOR) 40 MG tablet Take 1 tablet (40 mg total) by mouth daily at 6 PM. 09/03/13  Yes Cherene Altes, MD  clopidogrel (PLAVIX) 75 MG tablet Take 1 tablet by mouth once daily with breakfast Patient taking differently: Take 75 mg by mouth every morning. 10/25/20  Yes Marty Heck, MD  dapagliflozin propanediol (FARXIGA) 10 MG TABS tablet Take 10 mg by mouth every morning.   Yes [provider]  dexamethasone (DECADRON) 0.5 MG tablet Take 0.5 mg by mouth every morning. 09/03/13  Yes Cherene Altes, MD  ferrous sulfate 325 (65 FE) MG tablet Take 325 mg by mouth every evening.   Yes [provider]  insulin aspart (NOVOLOG FLEXPEN) 100 UNIT/ML FlexPen Inject 3-8 Units into the skin See  admin instructions. Inject 3-8 units subcutaneously up to three times daily per sliding scale - based on carb count   Yes [provider]  Insulin Glargine (BASAGLAR KWIKPEN) 100 UNIT/ML SOPN Inject 46 Units into the skin at bedtime. 07/11/15  Yes [provider]  levothyroxine (SYNTHROID) 125 MCG tablet Take 1 tablet (125 mcg total) by mouth daily before breakfast. 11/21/20  Yes Baglia, Corrina, PA-C  losartan (COZAAR) 25 MG tablet Take 1 tablet (25 mg total) by mouth daily. Patient taking differently: Take 25 mg by mouth every morning. 11/21/20  Yes Baglia, Corrina, PA-C  metoprolol succinate (TOPROL-XL) 25 MG 24 hr tablet TAKE 1 TABLET BY MOUTH EVERY MORNING & TAKE 1 TABLET BY MOUTH AT BEDTIME Patient taking differently: 25 mg 2 (two) times daily. 03/04/21  Yes Leonie Man, MD  nitroGLYCERIN (NITROSTAT) 0.4 MG SL tablet Place 1 tablet (0.4 mg total) under the tongue every 5 (five) minutes as needed for chest pain (up to 3 doses. If taking 3rd dose call 911). 11/21/20  Yes Dunn, Dayna N, PA-C  nystatin cream (MYCOSTATIN) Apply 1 application topically 2 (two) times daily. Patient taking differently: Apply 1 application topically 2 (two) times daily as needed (rash). 08/29/19  Yes Carlyle Basques, MD  Omega-3 Fatty Acids (FISH OIL) 1000 MG CAPS Take 1,000 mg by mouth every morning.   Yes [provider]  pantoprazole (PROTONIX) 40 MG tablet Take 1 tablet (40 mg total) by mouth 2 (two) times daily before  a meal. 09/03/13  Yes Cherene Altes, MD  prochlorperazine (COMPAZINE) 5 MG tablet Take 1 tablet (5 mg total) by mouth every 8 (eight) hours as needed for nausea or vomiting. 04/19/21  Yes Magnant, Gerrianne Scale, PA-C  Propylene Glycol (SYSTANE COMPLETE) 0.6 % SOLN Place 1 drop into both eyes daily as needed (dry eyes).   Yes [provider]  spironolactone (ALDACTONE) 25 MG tablet Take 1 tablet (25 mg total) by mouth daily. Patient taking differently: Take 25 mg by  mouth every morning. 12/21/20  Yes Lendon Colonel, NP  B-D UF III MINI PEN NEEDLES 31G X 5 MM MISC Inject into the skin. 05/28/20   [provider]  Continuous Blood Gluc Sensor (FREESTYLE LIBRE 2 SENSOR) MISC Apply topically every 14 (fourteen) days. 06/12/20   [provider]  HYDROcodone-acetaminophen (NORCO) 5-325 MG tablet Take 1 tablet by mouth every 12 (twelve) hours as needed for moderate pain. Patient not taking: Reported on 04/23/2021 04/19/21   Magnant, Gerrianne Scale, PA-C  [facility] VERIO test strip  12/24/17   [provider]      Allergies    Strawberry extract    Review of Systems   Review of Systems  Physical Exam Updated Vital Signs BP (!) 135/46    Pulse 82    Temp 98 F (36.7 C) (Oral)    Resp (!) 25    SpO2 100%  Physical Exam Vitals and nursing note reviewed.  Constitutional:      General: She is not in acute distress.    Appearance: She is obese.     Comments: Obese chronically ill-appearing 71 year old female in no acute distress speaking full sentences, pleasant  HENT:     Head: Normocephalic and atraumatic.     Nose: Nose normal.  Eyes:     General: No scleral icterus. Cardiovascular:     Rate and Rhythm: Normal rate and regular rhythm.     Pulses: Normal pulses.     Heart sounds: Normal heart sounds.     Comments: Unable to palpate pulses in DP PT distribution of left foot.  There is some lower extremity swelling  Doppler confirmed biphasic pulsatile flow in DP PT Cap refill intact approximately 3-seconds Pulmonary:     Effort: Pulmonary effort is normal. No respiratory distress.     Breath sounds: No wheezing.  Abdominal:     Palpations: Abdomen is soft.     Tenderness: There is no abdominal tenderness. There is no guarding or rebound.  Musculoskeletal:     Cervical back: Normal range of motion.     Right lower leg: No edema.     Left lower leg: No edema.  Skin:    General: Skin is warm and dry.     Comments: Ulcerative  lesions of left lateral ankle and left foot at the fifth metatarsal see pictures below  Neurological:     Mental Status: She is alert. Mental status is at baseline.     Comments: Sensation decreased but intact in bilateral lower extremities  Psychiatric:        Mood and Affect: Mood normal.        Behavior: Behavior normal.            ED Results / Procedures / Treatments   Labs (all labs ordered are listed, but only abnormal results are displayed) Labs Reviewed  BLOOD CULTURE ID PANEL (REFLEXED) - BCID2 - Abnormal; Notable for the following components:      Result  Value   Staphylococcus species DETECTED (*)    Staphylococcus aureus (BCID) DETECTED (*)    Meth resistant mecA/C and MREJ DETECTED (*)    All other components within normal limits  COMPREHENSIVE METABOLIC PANEL - Abnormal; Notable for the following components:   Sodium 133 (*)    Glucose, Bld 185 (*)    Creatinine, Ser 1.13 (*)    Calcium 8.6 (*)    Albumin 2.2 (*)    GFR, Estimated 53 (*)    All other components within normal limits  CBC WITH DIFFERENTIAL/PLATELET - Abnormal; Notable for the following components:   WBC 13.7 (*)    RBC 3.46 (*)    Hemoglobin 9.5 (*)    HCT 30.9 (*)    RDW 17.7 (*)    Platelets 831 (*)    Neutro Abs 12.2 (*)    Lymphs Abs 0.6 (*)    All other components within normal limits  HEMOGLOBIN A1C - Abnormal; Notable for the following components:   Hgb A1c MFr Bld 7.2 (*)    All other components within normal limits  HEMOGLOBIN AND HEMATOCRIT, BLOOD - Abnormal; Notable for the following components:   Hemoglobin 8.4 (*)    HCT 28.0 (*)    All other components within normal limits  CBG MONITORING, ED - Abnormal; Notable for the following components:   Glucose-Capillary 105 (*)    All other components within normal limits  CULTURE, BLOOD (ROUTINE X 2)  RESP PANEL BY RT-PCR (FLU A&B, COVID) ARPGX2  CULTURE, BLOOD (ROUTINE X 2)  LACTIC ACID, PLASMA  URINALYSIS, ROUTINE W REFLEX  MICROSCOPIC  SEDIMENTATION RATE  C-REACTIVE PROTEIN  HIV ANTIBODY (ROUTINE TESTING W REFLEX)  PREALBUMIN    EKG None  Radiology DG Tibia/Fibula Left  Result Date: 04/22/2021 CLINICAL DATA:  concern for osteo EXAM: LEFT TIBIA AND FIBULA - 2 VIEW COMPARISON:  None. FINDINGS: No cortical erosion or destruction. Couple oval pericentimeter ossific densities within the lateral tibiofemoral joint space. There is no evidence of fracture or other focal bone lesions. Visualized knee and ankle grossly unremarkable. Subcutaneus soft tissue edema of the ankle. Vascular calcifications. IMPRESSION: 1. No radiographic findings suggest acute osteomyelitis. No acute displaced fracture or dislocation. 2. Couple pericentimeter loose bodies within the lateral tibiofemoral joint space. 3. Subcutaneus soft tissue edema of the ankle. Consider dedicated ankle radiograph. Electronically Signed   By: Iven Finn M.D.   On: 04/22/2021 18:05   DG Ankle Complete Left  Result Date: 04/22/2021 CLINICAL DATA:  Left foot pain and swelling, left foot wound EXAM: LEFT ANKLE COMPLETE - 3+ VIEW COMPARISON:  04/19/2021 FINDINGS: Frontal, oblique, lateral views of the left ankle are obtained. There are no acute or destructive bony lesions. Postsurgical changes from prior fifth metatarsal amputation again noted. Stable small inferior calcaneal spur. There is diffuse soft tissue swelling of the lower leg, ankle, and hindfoot. No radiopaque foreign body. Stable soft tissue calcifications. IMPRESSION: 1. Stable diffuse soft tissue swelling. No radiographic evidence of osteomyelitis. Electronically Signed   By: Randa Ngo M.D.   On: 04/22/2021 18:00   MR FOOT LEFT WO CONTRAST  Result Date: 04/23/2021 CLINICAL DATA:  Left foot and ankle pain and swelling. Recent surgery. EXAM: MRI OF THE LEFT ANKLE WITHOUT CONTRAST; MRI OF THE LEFT FOOT WITHOUT CONTRAST TECHNIQUE: Multiplanar, multisequence MR imaging of the ankle and foot was  performed. No intravenous contrast was administered. COMPARISON:  Left ankle x-rays dated April 22, 2021. Left foot x-rays dated April 19, 2021.  MRI left foot dated May 24, 2019. FINDINGS: TENDONS Peroneal: Tendinosis and partial tears of the peroneal longus and brevis tendons with moderate amount of fluid in the tendon sheath. The tendon sheath appears to communicate with the focal soft tissue ulceration in the lateral distal lower leg (series 4, image 10). Posteromedial: Posterior tibial tendon intact. Flexor digitorum longus tendon intact. Flexor hallucis longus tendon intact. Anterior: Tibialis anterior tendon intact. Extensor hallucis longus tendon intact Extensor digitorum longus tendon intact. Achilles:  Intact. Plantar Fascia: Thickened proximal central band with small partial tear. LIGAMENTS Lateral: Anterior talofibular ligament intact, best appreciated on the sagittal and coronal images. Calcaneofibular ligament intact. Posterior talofibular ligament intact. Anterior and posterior tibiofibular ligaments intact. Medial: Deltoid ligament intact. Spring ligament intact. Lisfranc ligament intact. CARTILAGE Ankle Joint: Small joint effusion. Normal ankle mortise. No chondral defect. Subtalar Joints/Sinus Tarsi: Normal subtalar joints. Small posterior subtalar joint effusion. Partial loss of the normal fat within the sinus tarsi. Bones: Patchy geographic increased marrow signal within the calcaneus, likely red marrow hyperplasia. Mildly increased marrow edema in the distal fibula adjacent to the peroneal tendons. Moderate second TMT joint osteoarthritis. Mild third through fifth TMT joint osteoarthritis. Prior fifth ray amputation. Soft Tissue: Diffuse soft tissue swelling. No soft tissue mass or fluid collection. IMPRESSION: 1. Tendinosis and partial tears of the peroneal longus and brevis tendons with moderate tenosynovitis, likely infectious given the tendon sheath appears to communicate with the focal  soft tissue ulceration in the lateral distal lower leg. 2. Mildly increased marrow edema in the distal fibula adjacent to the peroneal tendons, concerning for early osteomyelitis given peroneal infection. 3. Prior fifth ray amputation. No evidence of osteomyelitis in the foot. Electronically Signed   By: Titus Dubin M.D.   On: 04/23/2021 12:24   MR ANKLE LEFT WO CONTRAST  Result Date: 04/23/2021 CLINICAL DATA:  Left foot and ankle pain and swelling. Recent surgery. EXAM: MRI OF THE LEFT ANKLE WITHOUT CONTRAST; MRI OF THE LEFT FOOT WITHOUT CONTRAST TECHNIQUE: Multiplanar, multisequence MR imaging of the ankle and foot was performed. No intravenous contrast was administered. COMPARISON:  Left ankle x-rays dated April 22, 2021. Left foot x-rays dated April 19, 2021. MRI left foot dated May 24, 2019. FINDINGS: TENDONS Peroneal: Tendinosis and partial tears of the peroneal longus and brevis tendons with moderate amount of fluid in the tendon sheath. The tendon sheath appears to communicate with the focal soft tissue ulceration in the lateral distal lower leg (series 4, image 10). Posteromedial: Posterior tibial tendon intact. Flexor digitorum longus tendon intact. Flexor hallucis longus tendon intact. Anterior: Tibialis anterior tendon intact. Extensor hallucis longus tendon intact Extensor digitorum longus tendon intact. Achilles:  Intact. Plantar Fascia: Thickened proximal central band with small partial tear. LIGAMENTS Lateral: Anterior talofibular ligament intact, best appreciated on the sagittal and coronal images. Calcaneofibular ligament intact. Posterior talofibular ligament intact. Anterior and posterior tibiofibular ligaments intact. Medial: Deltoid ligament intact. Spring ligament intact. Lisfranc ligament intact. CARTILAGE Ankle Joint: Small joint effusion. Normal ankle mortise. No chondral defect. Subtalar Joints/Sinus Tarsi: Normal subtalar joints. Small posterior subtalar joint effusion.  Partial loss of the normal fat within the sinus tarsi. Bones: Patchy geographic increased marrow signal within the calcaneus, likely red marrow hyperplasia. Mildly increased marrow edema in the distal fibula adjacent to the peroneal tendons. Moderate second TMT joint osteoarthritis. Mild third through fifth TMT joint osteoarthritis. Prior fifth ray amputation. Soft Tissue: Diffuse soft tissue swelling. No soft tissue mass or fluid collection. IMPRESSION: 1. Tendinosis  and partial tears of the peroneal longus and brevis tendons with moderate tenosynovitis, likely infectious given the tendon sheath appears to communicate with the focal soft tissue ulceration in the lateral distal lower leg. 2. Mildly increased marrow edema in the distal fibula adjacent to the peroneal tendons, concerning for early osteomyelitis given peroneal infection. 3. Prior fifth ray amputation. No evidence of osteomyelitis in the foot. Electronically Signed   By: Titus Dubin M.D.   On: 04/23/2021 12:24    Procedures Procedures    Medications Ordered in ED Medications  vancomycin (VANCOCIN) IVPB 1000 mg/200 mL premix (has no administration in time range)  sodium chloride flush (NS) 0.9 % injection 3 mL (3 mLs Intravenous Not Given 04/23/21 1148)  metroNIDAZOLE (FLAGYL) tablet 500 mg (500 mg Oral Given 04/23/21 1257)  insulin aspart (novoLOG) injection 0-15 Units (0 Units Subcutaneous Not Given 04/23/21 1310)  ceFEPIme (MAXIPIME) 2 g in sodium chloride 0.9 % 100 mL IVPB (has no administration in time range)  piperacillin-tazobactam (ZOSYN) IVPB 3.375 g (0 g Intravenous Stopped 04/23/21 1014)  sodium chloride 0.9 % bolus 1,000 mL (0 mLs Intravenous Stopped 04/23/21 1314)  vancomycin (VANCOREADY) IVPB 1500 mg/300 mL (1,500 mg Intravenous New Bag/Given 04/23/21 1016)    ED Course/ Medical Decision Making/ A&P Clinical Course as of 04/23/21 [place]  Tue Apr 23, 2021  1050 Patient admitted to Dr. Tamala Julian of hospitalist service Dr. Cannon Kettle  of podiatry will see patient in consultation [WF]    Clinical Course User Index [WF] Tedd Sias, PA                           Medical Decision Making Amount and/or Complexity of Data Reviewed Radiology: ordered.  Risk Prescription drug management. Decision regarding hospitalization.   This patient presents to the ED for concern of left foot pain and swelling, this involves a number of treatment options, and is a complaint that carries with it a high risk of complications and morbidity.  The differential diagnosis includes osteomyelitis, ulcer, abscess, cellulitis   Co morbidities: Discussed in HPI   Brief History:  Patient with 3 weeks of left lower extremity pain and swelling ankle and fifth metatarsal are location of ulcerations and swelling.  No systemic symptoms has been seen by podiatry was sent here to the ER for further evaluation.  EMR reviewed including pt PMHx, past surgical history and past visits to ER.   See HPI for more details   Lab Tests:  I ordered and independently interpreted labs.  The pertinent results include:    Labs notable for leukocytosis of 13.7 anemia chronic.  CMP without any significant abnormalities of acute nature.  Lactic within normal limits.  Blood cultures pending.     Imaging Studies:  Abnormal findings. I personally reviewed all imaging studies. Imaging notable for X-ray of left ankle and tib-fib without evidence of osteomyelitis.  Will obtain MRI.  I personally reviewed these x-rays  MRI is pending at time of admission   Cardiac Monitoring:  NA NA   Medicines ordered:  I ordered medication including broad-spectrum antibiotics for osteomyelitis/cellulitis Reevaluation of the patient after these medicines showed that the patient stayed the same I have reviewed the patients home medicines and have made adjustments as needed   Critical Interventions:  Discussion with podiatry   Consults:  I requested  consultation with podiatry,  and discussed lab and imaging findings as well as pertinent plan - they recommend: Admission IV  antibiotics MRI    Reevaluation:  After the interventions noted above I re-evaluated patient and found that they have :stayed the same   Social Determinants of Health:  The patient's social determinants of health were a factor in the care of this patient    Problem List / ED Course:  Lower extremity infection with complicating medical comorbidities of diabetes, peripheral vascular disease, age.  Dispostion:  After consideration of the diagnostic results and the patients response to treatment, I feel that the patent would benefit from admission to the hospital for IV antibiotics and podiatry evaluation.      Final Clinical Impression(s) / ED Diagnoses Final diagnoses:  Left foot infection    Rx / DC Orders ED Discharge Orders     None         Tedd Sias, [place] 04/23/21 1338    Elnora Morrison, MD 04/24/21 1622

## 2021-04-23 NOTE — ED Notes (Signed)
Per MD Cannon Kettle - pt NPO should start at 10am on 2/1

## 2021-04-23 NOTE — Consult Note (Signed)
Podiatry Consult Note  To: Dr. Fuller Plan and Pati Gallo, PA Reason for consult: Left foot and ankle infection  From: Dr.Neasia Fleeman  HPI: Tammy Good is a 71 y.o. female patient who seen at bedside for evaluation of Left foot and ankle pain. Patient was sent to the ER by [name], associate for worsening left foot and ankle infection.  Patient reports that since she has been in the ER her pain is doing better denies any current nausea vomiting fever or chills.   Patient has a previous history of being managed by Dr. Due to and states that she thinks the compression garments were too tight and rubbed her leg causing the ulceration that continued to worsen.  Denies injury/trip/fall/sprain/any other causative factors.   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   PAD (peripheral artery disease) ([place]) 05/26/2019   Osteomyelitis of left foot ([place]) 05/24/2019   Osteomyelitis ([place]) 05/23/2019   Cellulitis and abscess of foot, except toes 05/18/2019   Chronic osteomyelitis of ankle and foot ([place]) 05/18/2019   Cholesteatoma 02/24/2019   Cellulitis and abscess of toe 11/09/2018   Colon cancer screening 11/30/2017   Long term (current) use of insulin ([place]) 03/05/2017   Iron deficiency anemia 12/10/2014   Obesity (BMI 30-39.9) 11/17/2013   Diabetes mellitus type 2 in obese [facility])    Essential hypertension    Right thigh pain 11/01/2013   PVC's (premature ventricular contractions) 11/01/2013   Status post insertion of drug eluting coronary artery  stent to [facility] emergently and PTCA to prox. 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   Current Outpatient Medications on File Prior to Encounter  Medication Sig Dispense Refill   acetaminophen (TYLENOL) 500 MG tablet Take 500-1,000 mg by mouth 3 (three) times daily as needed for headache (pain).     apixaban (ELIQUIS) 5 MG TABS tablet Take 1 tablet (5 mg total) by mouth 2 (two) times daily. 60 tablet 6   atorvastatin (LIPITOR) 40 MG tablet Take 1 tablet (40 mg total) by mouth daily at 6 PM. 30 tablet 0   clopidogrel (PLAVIX) 75 MG tablet Take 1 tablet by mouth once daily with breakfast (Patient taking differently: Take 75 mg by mouth every morning.) 90 tablet 0   dapagliflozin propanediol (FARXIGA) 10 MG TABS tablet Take 10 mg by mouth every morning.     dexamethasone (DECADRON) 0.5 MG tablet Take 0.5 mg by mouth every morning.     ferrous sulfate 325 (65 FE) MG tablet Take 325 mg by mouth every evening.     insulin aspart (NOVOLOG FLEXPEN) 100 UNIT/ML FlexPen Inject 3-8 Units into the skin See admin instructions. Inject 3-8 units subcutaneously up to three times daily per sliding scale - based on carb count     Insulin  Glargine (BASAGLAR KWIKPEN) 100 UNIT/ML SOPN Inject 46 Units into the skin at bedtime.     levothyroxine (SYNTHROID) 125 MCG tablet Take 1 tablet (125 mcg total) by mouth daily before breakfast. 30 tablet 2   losartan (COZAAR) 25 MG tablet Take 1 tablet (25 mg total) by mouth daily. (Patient taking differently: Take 25 mg by mouth every morning.) 30 tablet 11   metoprolol succinate (TOPROL-XL) 25 MG 24 hr tablet TAKE 1 TABLET BY MOUTH  EVERY MORNING & TAKE 1 TABLET BY MOUTH AT BEDTIME (Patient taking differently: 25 mg 2 (two) times daily.) 180 tablet 0   nitroGLYCERIN (NITROSTAT) 0.4 MG SL tablet Place 1 tablet (0.4 mg total) under the tongue every 5 (five) minutes as needed for chest pain (up to 3 doses. If taking 3rd dose call 911). 25 tablet 3   nystatin cream (MYCOSTATIN) Apply 1 application topically 2 (two) times daily. (Patient taking differently: Apply 1 application topically 2 (two) times daily as needed (rash).) 30 g 1   Omega-3 Fatty Acids (FISH OIL) 1000 MG CAPS Take 1,000 mg by mouth every morning.     pantoprazole (PROTONIX) 40 MG tablet Take 1 tablet (40 mg total) by mouth 2 (two) times daily before a meal. 60 tablet 0   prochlorperazine (COMPAZINE) 5 MG tablet Take 1 tablet (5 mg total) by mouth every 8 (eight) hours as needed for nausea or vomiting. 20 tablet 0   Propylene Glycol (SYSTANE COMPLETE) 0.6 % SOLN Place 1 drop into both eyes daily as needed (dry eyes).     spironolactone (ALDACTONE) 25 MG tablet Take 1 tablet (25 mg total) by mouth daily. (Patient taking differently: Take 25 mg by mouth every morning.) 30 tablet 11   B-D UF III MINI PEN NEEDLES 31G X 5 MM MISC Inject into the skin.     Continuous Blood Gluc Sensor (FREESTYLE LIBRE 2 SENSOR) MISC Apply topically every 14 (fourteen) days.     HYDROcodone-acetaminophen (NORCO) 5-325 MG tablet Take 1 tablet by mouth every 12 (twelve) hours as needed for moderate pain. (Patient not taking: Reported on 04/23/2021) 20 tablet 0   ONETOUCH VERIO test strip   1    Allergies  Allergen Reactions   Strawberry Extract Itching, Swelling and Anaphylaxis    Mouth swells and gets itchy    Past Surgical History:  Procedure Laterality Date   ABDOMINAL AORTOGRAM W/LOWER EXTREMITY N/A 05/26/2019   Procedure: ABDOMINAL AORTOGRAM W/LOWER EXTREMITY;  Surgeon: Marty Heck, MD;  Location: [place] [facility];  Service: Cardiovascular;  Laterality: N/A;   ABDOMINAL  AORTOGRAM W/LOWER EXTREMITY N/A 11/15/2020   Procedure: ABDOMINAL AORTOGRAM W/LOWER EXTREMITY;  Surgeon: Marty Heck, MD;  Location: [place] [facility];  Service: Cardiovascular;  Laterality: N/A;   ABDOMINAL AORTOGRAM W/LOWER EXTREMITY N/A 04/18/2021   Procedure: ABDOMINAL AORTOGRAM W/LOWER EXTREMITY;  Surgeon: Marty Heck, MD;  Location: [place] [facility];  Service: Cardiovascular;  Laterality: N/A;   AMPUTATION Left 05/25/2019   Procedure: AMPUTATION RAY 5th;  Surgeon: Trula Slade, DPM;  Location: [place];  Service: Podiatry;  Laterality: Left;   Cardiac Event Monitor  July-August 2015   Sinus rhythm with PVCs   COLONOSCOPY N/A 08/31/2013   Procedure: COLONOSCOPY;  Surgeon: Juanita Craver, MD;  Location: [facility] ENDOSCOPY;  Service: Endoscopy;  Laterality: N/A;   CORONARY STENT INTERVENTION N/A 11/19/2020   Procedure: CORONARY STENT INTERVENTION;  Surgeon: Nelva Bush, MD;  Location: [place] [facility];  Service: Cardiovascular;  Laterality:  N/A;   ESOPHAGOGASTRODUODENOSCOPY N/A 09/01/2013   Procedure: ESOPHAGOGASTRODUODENOSCOPY (EGD);  Surgeon: Beryle Beams, MD;  Location: [facility] ENDOSCOPY;  Service: Endoscopy;  Laterality: N/A;  bedside   LEFT HEART CATH AND CORONARY ANGIOGRAPHY N/A 11/19/2020   Procedure: LEFT HEART CATH AND CORONARY ANGIOGRAPHY;  Surgeon: Nelva Bush, MD;  Location: [place] [facility];  Service: Cardiovascular;  Laterality: N/A;   LEFT HEART CATHETERIZATION WITH CORONARY ANGIOGRAM N/A 08/30/2013   Procedure: LEFT HEART CATHETERIZATION WITH CORONARY ANGIOGRAM;  Surgeon: Leonie Man, MD;  Location: [facility] CATH LAB: 100% mRCA (thrombus - extends to RPAV), 80% D1, 40% AVG Cx.   PERCUTANEOUS CORONARY STENT INTERVENTION (PCI-S)  08/30/2013   Procedure: PERCUTANEOUS CORONARY STENT INTERVENTION (PCI-S);  Surgeon: Leonie Man, MD;  Location: [facility] CATH LAB;  Integrity Resolute DES 2.0 mm x 38 mm -- 3.35 mm.; PTCA of proximal RPA V. - 3.0 mm x 15 mm balloon    PERIPHERAL VASCULAR ATHERECTOMY  05/26/2019   Procedure: PERIPHERAL VASCULAR ATHERECTOMY;  Surgeon: Marty Heck, MD;  Location: [place] [facility];  Service: Cardiovascular;;  Left SFA   PERIPHERAL VASCULAR INTERVENTION  05/26/2019   Procedure: PERIPHERAL VASCULAR INTERVENTION;  Surgeon: Marty Heck, MD;  Location: [place] [facility];  Service: Cardiovascular;;  Left SFA   PERIPHERAL VASCULAR INTERVENTION Right 11/15/2020   Procedure: PERIPHERAL VASCULAR INTERVENTION;  Surgeon: Marty Heck, MD;  Location: [place] [facility];  Service: Cardiovascular;  Laterality: Right;  Superficial Femoral Artery   PITUITARY SURGERY     TRANSTHORACIC ECHOCARDIOGRAM  08/30/2013   mild LVH. EF 50-55%. Moderate HK of the entire inferior myocardium. GR 1 DD. Mild LA dilation. Mildly reduced RV function   TYMPANOMASTOIDECTOMY Right 12/28/2017   Procedure: RIGHT TYMPANOMASTOIDECTOMY;  Surgeon: Leta Baptist, MD;  Location: [place];  Service: ENT;  Laterality: Right;    Family History  Problem Relation Age of Onset   Cancer Mother 76       multiple myeloma   Heart attack Father 84   Cancer Sister    Alzheimer's disease Maternal Grandmother     Social History   Socioeconomic History   Marital status: Widowed    Spouse name: Not on file   Number of children: Not on file   Years of education: Not on file   Highest education level: Not on file  Occupational History   Not on file  Tobacco Use   Smoking status: Former   Smokeless tobacco: Never  Substance and Sexual Activity   Alcohol use: No   Drug use: No   Sexual activity: Not Currently    Birth control/protection: Post-menopausal  Other Topics Concern   Not on file  Social History Narrative   Widow. Works at [employer].   Former smoker.   Overall not very active.  Hoping to get into water aerobics class.   Social Determinants of Health   Financial Resource Strain: Not on file  Food Insecurity: Not  on file  Transportation Needs: Not on file  Physical Activity: Not on file  Stress: Not on file  Social Connections: Not on file  Intimate Partner Violence: Not on file     Objective:  Today's Vitals   04/23/21 2036 04/23/21 2100 04/23/21 2130 04/23/21 2200  BP:  (!) 111/59  (!) 149/54  Pulse:  76 88 83  Resp:  20 (!) 25 18  Temp: 98.7 F (37.1 C)     TempSrc: Oral  SpO2:  97% 98% 97%  PainSc: 0-No pain      There is no height or weight on file to calculate BMI.   General: Alert and oriented x3 in no acute distress  Dermatology: Approximately 1 cm in length wound noted to the fifth metatarsal base and to the lateral lower leg with seropurulent drainage and fatty tissue exposed.  There is significant localized edema and cellulitis to the level of the lower third of the leg.  There is no malodor no crepitus but there is pain with palpation to the left lower extremity  Vascular: Dorsalis Pedis and Posterior Tibial pedal pulses difficult to palpate due to edema however audible on Doppler; status post vascular intervention 04/18/2021  Neurology: Johney Maine sensation intact via light touch bilateral.  Musculoskeletal: Mild to moderate tenderness to palpation to the left lateral foot and ankle.  No pain to palpation to posterior calf.   Results Xrays  Left tib-fib  IMPRESSION: 1. No radiographic findings suggest acute osteomyelitis. No acute displaced fracture or dislocation. 2. Couple pericentimeter loose bodies within the lateral tibiofemoral joint space. 3. Subcutaneus soft tissue edema of the ankle. Consider dedicated ankle radiograph. Left ankle IMPRESSION: 1. Stable diffuse soft tissue swelling. No radiographic evidence of osteomyelitis.  MRI foot, left IMPRESSION: 1. Tendinosis and partial tears of the peroneal longus and brevis tendons with moderate tenosynovitis, likely infectious given the tendon sheath appears to communicate with the focal soft tissue ulceration  in the lateral distal lower leg. 2. Mildly increased marrow edema in the distal fibula adjacent to the peroneal tendons, concerning for early osteomyelitis given peroneal infection. 3. Prior fifth ray amputation. No evidence of osteomyelitis in the foot. MRI ankle, left  IMPRESSION: 1. Tendinosis and partial tears of the peroneal longus and brevis tendons with moderate tenosynovitis, likely infectious given the tendon sheath appears to communicate with the focal soft tissue ulceration in the lateral distal lower leg. 2. Mildly increased marrow edema in the distal fibula adjacent to the peroneal tendons, concerning for early osteomyelitis given peroneal infection. 3. Prior fifth ray amputation. No evidence of osteomyelitis in the foot.   Results for orders placed or performed during the hospital encounter of 04/22/21  Blood culture (routine x 2)     Status: None (Preliminary result)   Collection Time: 04/22/21  5:33 PM   Specimen: BLOOD  Result Value Ref Range Status   Specimen Description BLOOD SITE NOT SPECIFIED  Final   Special Requests   Final    BOTTLES DRAWN AEROBIC AND ANAEROBIC Blood Culture adequate volume   Culture  Setup Time   Final    GRAM POSITIVE COCCI IN CLUSTERS IN BOTH AEROBIC AND ANAEROBIC BOTTLES CRITICAL RESULT CALLED TO, READ BACK BY AND VERIFIED WITH: [place] F. [id number] FCP Performed at [facility], [address], [place], [place] [address]    Culture GRAM POSITIVE COCCI  Final   Report Status PENDING  Incomplete  Blood Culture ID Panel (Reflexed)     Status: Abnormal   Collection Time: 04/22/21  5:33 PM  Result Value Ref Range Status   Enterococcus faecalis NOT DETECTED NOT DETECTED Final   Enterococcus Faecium NOT DETECTED NOT DETECTED Final   Listeria monocytogenes NOT DETECTED NOT DETECTED Final   Staphylococcus species DETECTED (A) NOT DETECTED Final    Comment: CRITICAL RESULT CALLED TO, READ BACK BY AND VERIFIED WITH: PHARMD  JEREMY F. [id number] FCP    Staphylococcus aureus (BCID) DETECTED (A) NOT DETECTED Final  Comment: Methicillin (oxacillin)-resistant Staphylococcus aureus (MRSA). MRSA is predictably resistant to beta-lactam antibiotics (except ceftaroline). Preferred therapy is vancomycin unless clinically contraindicated. Patient requires contact precautions if  hospitalized. CRITICAL RESULT CALLED TO, READ BACK BY AND VERIFIED WITH: PHARMD JEREMY F. [id number] FCP    Staphylococcus epidermidis NOT DETECTED NOT DETECTED Final   Staphylococcus lugdunensis NOT DETECTED NOT DETECTED Final   Streptococcus species NOT DETECTED NOT DETECTED Final   Streptococcus agalactiae NOT DETECTED NOT DETECTED Final   Streptococcus pneumoniae NOT DETECTED NOT DETECTED Final   Streptococcus pyogenes NOT DETECTED NOT DETECTED Final   A.calcoaceticus-baumannii NOT DETECTED NOT DETECTED Final   Bacteroides fragilis NOT DETECTED NOT DETECTED Final   Enterobacterales NOT DETECTED NOT DETECTED Final   Enterobacter cloacae complex NOT DETECTED NOT DETECTED Final   Escherichia coli NOT DETECTED NOT DETECTED Final   Klebsiella aerogenes NOT DETECTED NOT DETECTED Final   Klebsiella oxytoca NOT DETECTED NOT DETECTED Final   Klebsiella pneumoniae NOT DETECTED NOT DETECTED Final   Proteus species NOT DETECTED NOT DETECTED Final   Salmonella species NOT DETECTED NOT DETECTED Final   Serratia marcescens NOT DETECTED NOT DETECTED Final   Haemophilus influenzae NOT DETECTED NOT DETECTED Final   Neisseria meningitidis NOT DETECTED NOT DETECTED Final   Pseudomonas aeruginosa NOT DETECTED NOT DETECTED Final   Stenotrophomonas maltophilia NOT DETECTED NOT DETECTED Final   Candida albicans NOT DETECTED NOT DETECTED Final   Candida auris NOT DETECTED NOT DETECTED Final   Candida glabrata NOT DETECTED NOT DETECTED Final   Candida krusei NOT DETECTED NOT DETECTED Final   Candida parapsilosis NOT DETECTED NOT DETECTED Final   Candida  tropicalis NOT DETECTED NOT DETECTED Final   Cryptococcus neoformans/gattii NOT DETECTED NOT DETECTED Final   Meth resistant mecA/C and MREJ DETECTED (A) NOT DETECTED Final    Comment: CRITICAL RESULT CALLED TO, READ BACK BY AND VERIFIED WITH: PHARMD JEREMY F. [id number] FCP Performed at [facility], [address] [place], [place] [address]   Resp Panel by RT-PCR (Flu A&B, Covid) Nasopharyngeal Swab     Status: None   Collection Time: 04/23/21  9:08 AM   Specimen: Nasopharyngeal Swab; Nasopharyngeal(NP) swabs in vial transport medium  Result Value Ref Range Status   SARS Coronavirus 2 by RT PCR NEGATIVE NEGATIVE Final    Comment: (NOTE) SARS-CoV-2 target nucleic acids are NOT DETECTED.  The SARS-CoV-2 RNA is generally detectable in upper respiratory specimens during the acute phase of infection. The lowest concentration of SARS-CoV-2 viral copies this assay can detect is 138 copies/mL. A negative result does not preclude SARS-Cov-2 infection and should not be used as the sole basis for treatment or other patient management decisions. A negative result may occur with  improper specimen collection/handling, submission of specimen other than nasopharyngeal swab, presence of viral mutation(s) within the areas targeted by this assay, and inadequate number of viral copies(<138 copies/mL). A negative result must be combined with clinical observations, patient history, and epidemiological information. The expected result is Negative.  Fact Sheet for Patients:  EntrepreneurPulse.com.au  Fact Sheet for Healthcare Providers:  IncredibleEmployment.be  This test is no t yet approved or cleared by the [place] FDA and  has been authorized for detection and/or diagnosis of SARS-CoV-2 by FDA under an Emergency Use Authorization (EUA). This EUA will remain  in effect (meaning this test can be used) for the duration of the COVID-19 declaration  under Section 564(b)(1) of the Act, 21 U.S.C.section 360bbb-3(b)(1), unless the authorization is  terminated  or revoked sooner.       Influenza A by PCR NEGATIVE NEGATIVE Final   Influenza B by PCR NEGATIVE NEGATIVE Final    Comment: (NOTE) The Xpert Xpress SARS-CoV-2/FLU/RSV plus assay is intended as an aid in the diagnosis of influenza from Nasopharyngeal swab specimens and should not be used as a sole basis for treatment. Nasal washings and aspirates are unacceptable for Xpert Xpress SARS-CoV-2/FLU/RSV testing.  Fact Sheet for Patients: EntrepreneurPulse.com.au  Fact Sheet for Healthcare Providers: IncredibleEmployment.be  This test is not yet approved or cleared by the [place] FDA and has been authorized for detection and/or diagnosis of SARS-CoV-2 by FDA under an Emergency Use Authorization (EUA). This EUA will remain in effect (meaning this test can be used) for the duration of the COVID-19 declaration under Section 564(b)(1) of the Act, 21 U.S.C. section 360bbb-3(b)(1), unless the authorization is terminated or revoked.  Performed at [facility], [place] [address], [place], [place] [address]      Assessment and Plan: Problem List Items Addressed This Visit   None Visit Diagnoses     Left foot infection    -  Primary   Relevant Medications   vancomycin (VANCOREADY) IVPB 1500 mg/300 mL (Completed)   vancomycin (VANCOCIN) IVPB 1000 mg/200 mL premix (Start on 04/24/2021 11:00 AM)        -Complete examination performed -Xrays and MRI reviewed -Discussed treatment options for infectious tenosynovitis, cellulitis, abscess, increased marrow edema in the distal fibula concerning for early osteomyelitis -Patient elects to proceed with surgical intervention.  Consent to be obtained for left ankle irrigation and debridement with bone biopsy of the fibula.  Patient to be n.p.o. at 10 AM tomorrow for surgery tomorrow evening.  All  questions answered no benefit or guarantees given or implied about the outcome of her left ankle surgery.  Advised patient of possible risk and complication which could include further loss of limb or loss of life. -Recommend rest and elevation for pain and edema control -Recommend continue with medical management and IV antibiotics -Patient to be nonweightbearing to the left foot -Consult appreciated -Podiatry to follow   Dr. Landis Martins, [place] and [place] [phone] office [phone] cell  Time spent with patient for exam and coordination of care: 40 mins

## 2021-04-23 NOTE — ED Notes (Signed)
Pt has 2+ edema of LLE and 3+ edema of left foot. Left pedal pulse audible by doppler. Pt has wound on outer side of LLE that is 2cmx2.4cm. The wound bedding has yellow tissue hanging out of it. Pt's LLE around the ankle area and wound is erythematous. The area is slightly hot to the touch. Pt able to move foot. Sensation intact.

## 2021-04-23 NOTE — ED Notes (Signed)
Hospitalist at bedside 

## 2021-04-23 NOTE — H&P (Addendum)
History and Physical    Tammy Good MSX:[id number] DOB: 05-Aug-1950 DOA: 04/22/2021  Referring MD/NP/PA: Pati Gallo, PA-C PCP: [name], MD  Patient coming from: Sent from podiatry clinic  Chief Complaint: Left foot pain and swelling  I have personally briefly reviewed patient's old medical records in [place]   HPI: Tammy Good is a 71 y.o. female with medical history significant of hyperlipidemia, CAD s/p PCI, DM type II, PAD, panhypopituitarism, and osteomyelitis s/p amputation of the left fifth toe who presents for left foot pain and swelling.  She had been initially seen in Dr. Jess Barters office on 1/9 for left foot and leg pain after falling a couple weeks prior.  At that time she states x-rays were done of her leg and she was prescribed compression socks which she wore approximately 2 days, but thereafter noticed a blister form on the lateral aspect of her foot.  She called his office and was told to use silver sulfadiazine cream on the wound and to keep it wrapped.  However, since that time she reports that she has had worsening pain and swelling in the foot with redness present.  Bearing weight on the affected leg worsens pain.  She had a procedure done with [name] of vascular surgery on 1/26.  Following the procedure and there was report of some episodes of nausea and vomiting, but patient reports that has since resolved.  She then followed up with orthopedics in their office on 1/27.  She kept thinking that her foot was infected and told her providers doing the same appointments, but had not been placed on antibiotics for treatment.  Denies having any fevers, but thinks she may have had some chills 1 day last week.  Patient also notes issues with constipation for which she takes stool softeners intermittently, but will develop diarrhea.  Stools have been dark, but she denies seeing any blood and relates it to her eating prunes.  She followed up with Dr. Earleen Newport at the  podiatry clinic yesterday due to her concerns of infection foot, and had been sent to the ED for further evaluation.  ED Course: Upon admission into the emergency department patient was noted to be afebrile with respirations 16-22, blood pressures 98/57-131/56, and all other vital signs maintained.  Labs significant for WBC 13.7, hemoglobin 9.8, platelets 831, sodium 133, BUN 13, creatinine 1.13, albumin 2.2, and lactic acid 1.6.  X-rays noted diffuse soft tissue swelling of the left foot without radiographic evidence of osteomyelitis.  Dr. Cannon Kettle of podiatry have been consulted.  Patient was given 1 L normal saline IV fluids, vancomycin, and Zosyn.  MRI of the foot was ordered.  Review of Systems  Constitutional:  Negative for fever.  Respiratory:  Positive for shortness of breath (Intermittently with exertion chronic).   Cardiovascular:  Positive for leg swelling. Negative for chest pain.  Gastrointestinal:  Positive for constipation and diarrhea. Negative for abdominal pain and blood in stool.  Genitourinary:  Negative for hematuria.  Neurological:  Negative for loss of consciousness.  All other systems reviewed and are negative.  Past Medical History:  Diagnosis Date   CAD S/P percutaneous coronary angioplasty 08/2013   100% mRCA - PCI Integrity Resolute DES 3.0 mm x 38 mm - 3.35 mm; PTCA of RPA V 2.0 mm x 15 mm   Cholesteatoma    right   Diabetes mellitus type 2 in obese (HCC)    On insulin and Invokana   History of osteomyelitis L  5th Toe all 05/2019   s/p Partial Ray Amputation with partial closure; 6 wks Abx & LSFA Atherectomy/DEB PTA with Stent for focal dissection.   Hyperlipidemia with target LDL less than 70    Hypothyroidism (acquired)    Mild essential hypertension    Obesity (BMI 30-39.9) 11/17/2013   PAD (peripheral artery disease) ([place]) 05/26/2019   05/26/19: Abd AoGram- BLE runoff -> L SFA orbital atherectomy - PTA w/ DEB & Stent 6 x 40 Luttonix (for focal dissection) -  patent Pop A with 3 V runoff. LEA Dopplers 01/03/2020: RABI (prev) 0.68 (0.53)/ RTBI (prev) 0.58 (0.33); LABI (prev) 0.80 (0.64), LTBI (prev) 0.64 (0.51); R mSFA ~50-74%, L mSFA 50-74%. Patent Prox SFA stent < 49% stenosis   Panhypopituitarism (HCC)    ST elevation myocardial infarction (STEMI) of inferior wall, subsequent episode of care ([place]) 08/2013   80% branch of D1, 40% mid AV groove circumflex, 100% RCA with subacute thrombus -- thrombus extending into RPA V with 100% occlusion after initial angioplasty of mid RCA ;; Post MI ECHO 6/9/'15: EF 50-55%, mild LVH with moderate HK of inferior wall, Gr1 DD, mild LA dilation; mildly reduced RV function    Past Surgical History:  Procedure Laterality Date   ABDOMINAL AORTOGRAM W/LOWER EXTREMITY N/A 05/26/2019   Procedure: ABDOMINAL AORTOGRAM W/LOWER EXTREMITY;  Surgeon: Marty Heck, MD;  Location: [facility];  Service: Cardiovascular;  Laterality: N/A;   ABDOMINAL AORTOGRAM W/LOWER EXTREMITY N/A 11/15/2020   Procedure: ABDOMINAL AORTOGRAM W/LOWER EXTREMITY;  Surgeon: Marty Heck, MD;  Location: [facility];  Service: Cardiovascular;  Laterality: N/A;   ABDOMINAL AORTOGRAM W/LOWER EXTREMITY N/A 04/18/2021   Procedure: ABDOMINAL AORTOGRAM W/LOWER EXTREMITY;  Surgeon: Marty Heck, MD;  Location: [facility];  Service: Cardiovascular;  Laterality: N/A;   AMPUTATION Left 05/25/2019   Procedure: AMPUTATION RAY 5th;  Surgeon: Trula Slade, DPM;  Location: [facility];  Service: Podiatry;  Laterality: Left;   Cardiac Event Monitor  July-August 2015   Sinus rhythm with PVCs   COLONOSCOPY N/A 08/31/2013   Procedure: COLONOSCOPY;  Surgeon: Juanita Craver, MD;  Location: [facility] ENDOSCOPY;  Service: Endoscopy;  Laterality: N/A;   CORONARY STENT INTERVENTION N/A 11/19/2020   Procedure: CORONARY STENT INTERVENTION;  Surgeon: Nelva Bush, MD;  Location: [facility];  Service: Cardiovascular;  Laterality: N/A;    ESOPHAGOGASTRODUODENOSCOPY N/A 09/01/2013   Procedure: ESOPHAGOGASTRODUODENOSCOPY (EGD);  Surgeon: Beryle Beams, MD;  Location[facility];  Service: Endoscopy;  Laterality: N/A;  bedside   LEFT HEART CATH AND CORONARY ANGIOGRAPHY N/A 11/19/2020   Procedure: LEFT HEART CATH AND CORONARY ANGIOGRAPHY;  Surgeon: Nelva Bush, MD;  Location: [place] [facility];  Service: Cardiovascular;  Laterality: N/A;   LEFT HEART CATHETERIZATION WITH CORONARY ANGIOGRAM N/A 08/30/2013   Procedure: LEFT HEART CATHETERIZATION WITH CORONARY ANGIOGRAM;  Surgeon: Leonie Man, MD;  Location: [facility] CATH LAB: 100% mRCA (thrombus - extends to RPAV), 80% D1, 40% AVG Cx.   PERCUTANEOUS CORONARY STENT INTERVENTION (PCI-S)  08/30/2013   Procedure: PERCUTANEOUS CORONARY STENT INTERVENTION (PCI-S);  Surgeon: Leonie Man, MD;  Location: [facility] CATH LAB;  Integrity Resolute DES 2.0 mm x 38 mm -- 3.35 mm.; PTCA of proximal RPA V. - 3.0 mm x 15 mm balloon   PERIPHERAL VASCULAR ATHERECTOMY  05/26/2019   Procedure: PERIPHERAL VASCULAR ATHERECTOMY;  Surgeon: Marty Heck, MD;  Location: [place] [facility];  Service: Cardiovascular;;  Left SFA   PERIPHERAL VASCULAR  INTERVENTION  05/26/2019   Procedure: PERIPHERAL VASCULAR INTERVENTION;  Surgeon: Marty Heck, MD;  Location: [place] [facility];  Service: Cardiovascular;;  Left SFA   PERIPHERAL VASCULAR INTERVENTION Right 11/15/2020   Procedure: PERIPHERAL VASCULAR INTERVENTION;  Surgeon: Marty Heck, MD;  Location: [facility];  Service: Cardiovascular;  Laterality: Right;  Superficial Femoral Artery   PITUITARY SURGERY     TRANSTHORACIC ECHOCARDIOGRAM  08/30/2013   mild LVH. EF 50-55%. Moderate HK of the entire inferior myocardium. GR 1 DD. Mild LA dilation. Mildly reduced RV function   TYMPANOMASTOIDECTOMY Right 12/28/2017   Procedure: RIGHT TYMPANOMASTOIDECTOMY;  Surgeon: Leta Baptist, MD;  Location: [facility];  Service: ENT;   Laterality: Right;     reports that she has quit smoking. She has never used smokeless tobacco. She reports that she does not drink alcohol and does not use drugs.  Allergies  Allergen Reactions   Strawberry Extract Itching, Swelling and Anaphylaxis    Mouth swells and gets itchy    Family History  Problem Relation Age of Onset   Cancer Mother 63       multiple myeloma   Heart attack Father 54   Cancer Sister    Alzheimer's disease Maternal Grandmother     Prior to Admission medications   Medication Sig Start Date End Date Taking? Authorizing Provider  apixaban (ELIQUIS) 5 MG TABS tablet Take 1 tablet (5 mg total) by mouth 2 (two) times daily. 11/21/20   Baglia, Corrina, PA-C  atorvastatin (LIPITOR) 40 MG tablet Take 1 tablet (40 mg total) by mouth daily at 6 PM. 09/03/13   Thereasa Solo, Kimberlee Nearing, MD  B-D UF III MINI PEN NEEDLES 31G X 5 MM MISC Inject into the skin. 05/28/20   [provider]  clopidogrel (PLAVIX) 75 MG tablet Take 1 tablet by mouth once daily with breakfast 10/25/20   Marty Heck, MD  Continuous Blood Gluc Sensor (FREESTYLE LIBRE 2 SENSOR) MISC Apply topically every 14 (fourteen) days. 06/12/20   [provider]  dexamethasone (DECADRON) 0.5 MG tablet Take 0.5 mg by mouth daily. 09/03/13   Cherene Altes, MD  FARXIGA 10 MG TABS tablet Take 10 mg by mouth daily.  07/25/15   [provider]  ferrous sulfate 325 (65 FE) MG tablet Take 325 mg by mouth daily with breakfast.    [provider]  HYDROcodone-acetaminophen (NORCO) 5-325 MG tablet Take 1 tablet by mouth every 12 (twelve) hours as needed for moderate pain. 04/19/21   Magnant, Charles L, PA-C  Insulin Glargine (BASAGLAR KWIKPEN) 100 UNIT/ML SOPN Inject 48 Units into the skin at bedtime. 07/11/15   [provider]  levothyroxine (SYNTHROID) 125 MCG tablet Take 1 tablet (125 mcg total) by mouth daily before breakfast. 11/21/20   Baglia, Corrina, PA-C  losartan (COZAAR) 25  MG tablet Take 1 tablet (25 mg total) by mouth daily. 11/21/20   Baglia, Corrina, PA-C  metoprolol succinate (TOPROL-XL) 25 MG 24 hr tablet TAKE 1 TABLET BY MOUTH EVERY MORNING & TAKE 1 TABLET BY MOUTH AT BEDTIME 03/04/21   Leonie Man, MD  nitroGLYCERIN (NITROSTAT) 0.4 MG SL tablet Place 1 tablet (0.4 mg total) under the tongue every 5 (five) minutes as needed for chest pain (up to 3 doses. If taking 3rd dose call 911). 11/21/20   Dunn, Dayna N, PA-C  NOVOLOG FLEXPEN 100 UNIT/ML FlexPen Inject 5-15 Units into the skin 3 (three) times daily with meals. Per sliding scale 01/03/19  [provider]  nystatin cream (MYCOSTATIN) Apply 1 application topically 2 (two) times daily. Patient taking differently: Apply 1 application topically 2 (two) times daily as needed (rash). 08/29/19   Carlyle Basques, MD  Omega-3 Fatty Acids (FISH OIL) 1000 MG CAPS Take 1,000 mg by mouth daily.    [provider]  [facility] VERIO test strip  12/24/17   [provider]  pantoprazole (PROTONIX) 40 MG tablet Take 1 tablet (40 mg total) by mouth 2 (two) times daily before a meal. 09/03/13   Cherene Altes, MD  prochlorperazine (COMPAZINE) 5 MG tablet Take 1 tablet (5 mg total) by mouth every 8 (eight) hours as needed for nausea or vomiting. 04/19/21   Magnant, Gerrianne Scale, PA-C  Propylene Glycol (SYSTANE COMPLETE) 0.6 % SOLN Place 1 drop into both eyes daily as needed (dry eyes).    [provider]  spironolactone (ALDACTONE) 25 MG tablet Take 1 tablet (25 mg total) by mouth daily. 12/21/20   Lendon Colonel, NP    Physical Exam:  Constitutional: Elderly female currently in no acute distress Vitals:   04/23/21 0848 04/23/21 0900 04/23/21 0915 04/23/21 1000  BP: (!) 131/56 (!) 127/54 (!) 128/51 (!) 98/57  Pulse: 81 78 79 76  Resp: '17 16 18 18  ' Temp:      TempSrc:      SpO2: 98% 96% 99% 97%   Eyes: PERRL, lids and conjunctivae normal ENMT: Mucous membranes are moist. Posterior  pharynx clear of any exudate or lesions.Normal dentition.  Neck: normal, supple, no masses, no thyromegaly Respiratory: clear to auscultation bilaterally, no wheezing, no crackles. Normal respiratory effort.   Cardiovascular: Regular rate and rhythm, no murmurs / rubs / gallops.  Swelling of the left leg. Abdomen: no tenderness, no masses palpated. No hepatosplenomegaly. Bowel sounds positive.  Musculoskeletal: no clubbing / cyanosis.  Amputation present on the left fifth toe. Skin: Shallow appearing ulceration on the lateral aspect of the left foot with erythema present tracking up the tibia. Neurologic: CN 2-12 grossly intact. Strength 5/5 in all 4.  Psychiatric: Normal judgment and insight. Alert and oriented x 3. Normal mood.     Labs on Admission: I have personally reviewed following labs and imaging studies  CBC: Recent Labs  Lab 04/18/21 0549 04/22/21 1739  WBC  --  13.7*  NEUTROABS  --  12.2*  HGB 10.9* 9.5*  HCT 32.0* 30.9*  MCV  --  89.3  PLT  --  076*   Basic Metabolic Panel: Recent Labs  Lab 04/18/21 0549 04/22/21 1739  NA 139 133*  K 3.9 4.2  CL 103 100  CO2  --  23  GLUCOSE 99 185*  BUN 10 13  CREATININE 0.90 1.13*  CALCIUM  --  8.6*   GFR: Estimated Creatinine Clearance: 57.9 mL/min (A) (by C-G formula based on SCr of 1.13 mg/dL (H)). Liver Function Tests: Recent Labs  Lab 04/22/21 1739  AST 15  ALT 13  ALKPHOS 87  BILITOT 0.6  PROT 7.9  ALBUMIN 2.2*   No results for input(s): LIPASE, AMYLASE in the last 168 hours. No results for input(s): AMMONIA in the last 168 hours. Coagulation Profile: No results for input(s): INR, PROTIME in the last 168 hours. Cardiac Enzymes: No results for input(s): CKTOTAL, CKMB, CKMBINDEX, TROPONINI in the last 168 hours. BNP (last 3 results) No results for input(s): PROBNP in the last 8760 hours. HbA1C: No results for input(s): HGBA1C in the last 72 hours. CBG: Recent Labs  Lab 04/18/21 0903  GLUCAP 88    Lipid Profile: No results for input(s): CHOL, HDL, LDLCALC, TRIG, CHOLHDL, LDLDIRECT in the last 72 hours. Thyroid Function Tests: No results for input(s): TSH, T4TOTAL, FREET4, T3FREE, THYROIDAB in the last 72 hours. Anemia Panel: No results for input(s): VITAMINB12, FOLATE, FERRITIN, TIBC, IRON, RETICCTPCT in the last 72 hours. Urine analysis:    Component Value Date/Time   COLORURINE STRAW (A) 05/24/2019 [place] 05/24/2019 0918   LABSPEC 1.012 05/24/2019 0918   PHURINE 5.0 05/24/2019 0918   GLUCOSEU 150 (A) 05/24/2019 0918   HGBUR NEGATIVE 05/24/2019 0918   BILIRUBINUR NEGATIVE 05/24/2019 0918   KETONESUR NEGATIVE 05/24/2019 0918   PROTEINUR NEGATIVE 05/24/2019 0918   NITRITE NEGATIVE 05/24/2019 0918   LEUKOCYTESUR NEGATIVE 05/24/2019 0918   Sepsis Labs: No results found for this or any previous visit (from the past 240 hour(s)).   Radiological Exams on Admission: DG Tibia/Fibula Left  Result Date: 04/22/2021 CLINICAL DATA:  concern for osteo EXAM: LEFT TIBIA AND FIBULA - 2 VIEW COMPARISON:  None. FINDINGS: No cortical erosion or destruction. Couple oval pericentimeter ossific densities within the lateral tibiofemoral joint space. There is no evidence of fracture or other focal bone lesions. Visualized knee and ankle grossly unremarkable. Subcutaneus soft tissue edema of the ankle. Vascular calcifications. IMPRESSION: 1. No radiographic findings suggest acute osteomyelitis. No acute displaced fracture or dislocation. 2. Couple pericentimeter loose bodies within the lateral tibiofemoral joint space. 3. Subcutaneus soft tissue edema of the ankle. Consider dedicated ankle radiograph. Electronically Signed   By: Iven Finn M.D.   On: 04/22/2021 18:05   DG Ankle Complete Left  Result Date: 04/22/2021 CLINICAL DATA:  Left foot pain and swelling, left foot wound EXAM: LEFT ANKLE COMPLETE - 3+ VIEW COMPARISON:  04/19/2021 FINDINGS: Frontal, oblique, lateral views  of the left ankle are obtained. There are no acute or destructive bony lesions. Postsurgical changes from prior fifth metatarsal amputation again noted. Stable small inferior calcaneal spur. There is diffuse soft tissue swelling of the lower leg, ankle, and hindfoot. No radiopaque foreign body. Stable soft tissue calcifications. IMPRESSION: 1. Stable diffuse soft tissue swelling. No radiographic evidence of osteomyelitis. Electronically Signed   By: Randa Ngo M.D.   On: 04/22/2021 18:00    EKG: Independently reviewed.  Sinus rhythm at 83 bpm  Assessment/Plan Diabetic foot infection  cellulitis of the left foot: Acute.  Patient presents for a nonhealing wound and pain on the lateral aspect of the left foot since the beginning of this month.  Patient had initially been started on empiric antibiotics of vancomycin, metronidazole, and cefepime.  -Admit to a telemetry bed -Wound care order set utilized -Follow-up blood culture -Check ESR, CRP, prealbumin, and hemoglobin A1c -Hydrocodone as needed for pain -Follow-up MRI of the left foot and ankle-moderate tenosynovitis appreciated with signs of early osteomyelitis -Appreciate podiatry consultative services, will follow-up for any further recommendation  MRSA bacteremia: Blood culture noted to be positive for MRSA.  ID been consulted  -recommended de-escalated antibiotics to vancomycin IV alone -Check limited echocardiogram, but may warrant TEE  Leukocytosis: WBC elevated 13.7.  Suspect secondary to above. -Recheck CBC tomorrow morning  Paroxysmal  atrial fibrillation on chronic anticoagulation: Patient appears to be in sinus rhythm at this time. -Heparin per pharmacy in case of possible need of procedure  Normocytic anemia: Acute.  Hemoglobin 9.5 g/dL which appears to have been trending down.  Hemoglobin was 10.9 on 1/26, and previously had been within  normal range back in 11/2020.  Patient denies seeing any blood in stools. -Type and screen  for possible need of blood products -Check stool guaiac negative -Continue to monitor H&H Repeat hemoglobin 11.4.  Suspect other labs possibly drawn possibly off IV line.  Peripheral vascular disease  CAD -Held Plavix due to possible need of procedure and or bleeding -Continue statin  Diabetes mellitus type 2: On admission glucose was noted to be 185.  Home regimen includes glargine 46 units nightly with NovoLog 3-8 units before every meal with meals. -Hypoglycemic protocols -Check hemoglobin A1c -Decreased pharmacy substitution to 23 units nightly due to possible need of procedure -CBGs before every meal with moderate SSI -Adjust insulin regimen as needed  Panhypopituitarism  -Continue levothyroxine and Decadron Essential hypertension: Home blood pressure regimen includes losartan 25 mg every morning, metoprolol succinate 25 mg twice daily, and spironolactone 25 mg daily. -Continue home regimen as tolerated  Thrombocytosis: Acute.  Platelet count elevated at 831.  Suspect secondary to above. -Check CBC in a.m.  Hyperlipidemia: Associated with diabetes. -Continue atorvastatin  Hypoalbuminemia: Albumin noted to be 2.2. -Check prealbumin  GERD  -Continue Protonix  Obesity: BMI 34.61 kg/m2  DVT prophylaxis: Heparin Code Status: Full Family Communication: Attempted to call sister but went to voicemail. Disposition Plan: To be determined Consults called: Podiatry Admission status: Inpatient, likely require more than 2 midnight stay for IV antibiotics Norval Morton MD [facility]   If 7PM-7AM, please contact night-coverage   04/23/2021, 10:46 AM

## 2021-04-23 NOTE — ED Notes (Signed)
Patient transported to MRI 

## 2021-04-23 NOTE — ED Notes (Signed)
I cleaned pt's wound w/NS and applied a dry dressing.

## 2021-04-23 NOTE — Progress Notes (Addendum)
Pharmacy Antibiotic Note  Tammy Good is a 71 y.o. female admitted on 04/22/2021 presenting with LLE, cellulitis.  Pharmacy has been consulted for vancomycin dosing.  Plan: Vancomycin 1500 mg IV x 1, then 1000 mg IV q 24h (eAUC 465, Goal AUC 400-550, SCr 1.13) Monitor renal function, clinical progression to narrow Vancomycin levels as indicated     Temp (24hrs), Avg:98 F (36.7 C), Min:98 F (36.7 C), Max:98 F (36.7 C)  Recent Labs  Lab 04/18/21 0549 04/22/21 1739  WBC  --  13.7*  CREATININE 0.90 1.13*  LATICACIDVEN  --  1.6    Estimated Creatinine Clearance: 57.9 mL/min (A) (by C-G formula based on SCr of 1.13 mg/dL (H)).    Allergies  Allergen Reactions   Strawberry Extract Itching, Swelling and Anaphylaxis    Mouth swells and gets itchy    Bertis Ruddy, PharmD Clinical Pharmacist ED Pharmacist Phone # [phone] 04/23/2021 9:06 AM   ADDENDUM: Tammy Good is a 71 y.o. female admitted on 04/22/2021 presenting with LLE, cellulitis.  Pharmacy has been additionally consulted for cefepime dosing.  Plan: Cefepime 2g IV q12h Metronidazole per MD Vancomycin as above Monitor renal function, clinical progression to narrow  Thank you for involving pharmacy in this patient's care.  Elita Quick, PharmD PGY1 Ambulatory Care Pharmacy Resident 04/23/2021 11:20 AM  **Pharmacist phone directory can be found on [email or web address] listed under [place]**

## 2021-04-24 ENCOUNTER — Other Ambulatory Visit: Payer: Self-pay

## 2021-04-24 ENCOUNTER — Encounter (HOSPITAL_COMMUNITY)
Admission: EM | Disposition: A | Discharge status: Home Health | Payer: Self-pay | Source: Home / Self Care | Attending: Family Medicine

## 2021-04-24 ENCOUNTER — Inpatient Hospital Stay (HOSPITAL_COMMUNITY): Payer: Medicare PPO

## 2021-04-24 ENCOUNTER — Inpatient Hospital Stay (HOSPITAL_COMMUNITY): Payer: Medicare PPO | Admitting: Certified Registered Nurse Anesthetist

## 2021-04-24 ENCOUNTER — Encounter (HOSPITAL_COMMUNITY): Payer: Self-pay | Admitting: Internal Medicine

## 2021-04-24 DIAGNOSIS — L03116 Cellulitis of left lower limb: Secondary | ICD-10-CM | POA: Diagnosis not present

## 2021-04-24 DIAGNOSIS — L02612 Cutaneous abscess of left foot: Secondary | ICD-10-CM

## 2021-04-24 DIAGNOSIS — R7881 Bacteremia: Secondary | ICD-10-CM | POA: Diagnosis not present

## 2021-04-24 DIAGNOSIS — A419 Sepsis, unspecified organism: Secondary | ICD-10-CM

## 2021-04-24 DIAGNOSIS — M86172 Other acute osteomyelitis, left ankle and foot: Secondary | ICD-10-CM

## 2021-04-24 HISTORY — PX: INCISION AND DRAINAGE: SHX5863

## 2021-04-24 HISTORY — PX: BONE BIOPSY: SHX375

## 2021-04-24 LAB — CBC
HCT: 25.6 % — ABNORMAL LOW (ref 36.0–46.0)
Hemoglobin: 7.9 g/dL — ABNORMAL LOW (ref 12.0–15.0)
MCH: 27.3 pg (ref 26.0–34.0)
MCHC: 30.9 g/dL (ref 30.0–36.0)
MCV: 88.6 fL (ref 80.0–100.0)
Platelets: 630 10*3/uL — ABNORMAL HIGH (ref 150–400)
RBC: 2.89 MIL/uL — ABNORMAL LOW (ref 3.87–5.11)
RDW: 17.9 % — ABNORMAL HIGH (ref 11.5–15.5)
WBC: 9.7 10*3/uL (ref 4.0–10.5)
nRBC: 0 % (ref 0.0–0.2)

## 2021-04-24 LAB — BASIC METABOLIC PANEL
Anion gap: 7 (ref 5–15)
BUN: 13 mg/dL (ref 8–23)
CO2: 21 mmol/L — ABNORMAL LOW (ref 22–32)
Calcium: 8.1 mg/dL — ABNORMAL LOW (ref 8.9–10.3)
Chloride: 107 mmol/L (ref 98–111)
Creatinine, Ser: 0.92 mg/dL (ref 0.44–1.00)
GFR, Estimated: >60 mL/min (ref >60)
Glucose, Bld: 144 mg/dL — ABNORMAL HIGH (ref 70–99)
Potassium: 4.1 mmol/L (ref 3.5–5.1)
Sodium: 135 mmol/L (ref 135–145)

## 2021-04-24 LAB — GLUCOSE, CAPILLARY
Glucose-Capillary: 133 mg/dL — ABNORMAL HIGH (ref 70–99)
Glucose-Capillary: 139 mg/dL — ABNORMAL HIGH (ref 70–99)
Glucose-Capillary: 145 mg/dL — ABNORMAL HIGH (ref 70–99)
Glucose-Capillary: 153 mg/dL — ABNORMAL HIGH (ref 70–99)

## 2021-04-24 LAB — APTT
aPTT: 61 seconds — ABNORMAL HIGH (ref 24–36)
aPTT: 77 seconds — ABNORMAL HIGH (ref 24–36)

## 2021-04-24 LAB — HEPARIN LEVEL (UNFRACTIONATED): Heparin Unfractionated: >1.1 IU/mL — ABNORMAL HIGH (ref 0.30–0.70)

## 2021-04-24 LAB — ECHOCARDIOGRAM LIMITED: S' Lateral: 2.6 cm

## 2021-04-24 LAB — SURGICAL PCR SCREEN
MRSA, PCR: POSITIVE — AB
Staphylococcus aureus: POSITIVE — AB

## 2021-04-24 LAB — CBG MONITORING, ED: Glucose-Capillary: 149 mg/dL — ABNORMAL HIGH (ref 70–99)

## 2021-04-24 SURGERY — INCISION AND DRAINAGE
Anesthesia: Monitor Anesthesia Care | Site: Leg Lower | Laterality: Left

## 2021-04-24 MED ORDER — LIDOCAINE HCL (PF) 1 % IJ SOLN
INTRAMUSCULAR | Status: AC
  Filled 2021-04-24: qty 30

## 2021-04-24 MED ORDER — LACTATED RINGERS IV SOLN: INTRAVENOUS | Status: DC

## 2021-04-24 MED ORDER — EPHEDRINE 5 MG/ML INJ
INTRAVENOUS | Status: AC
  Filled 2021-04-24: qty 5

## 2021-04-24 MED ORDER — PROPOFOL 10 MG/ML IV BOLUS
INTRAVENOUS | Status: DC | PRN
Start: 2021-04-24 — End: 2021-04-24
  Administered 2021-04-24: 30 mg via INTRAVENOUS

## 2021-04-24 MED ORDER — LIDOCAINE HCL 1 % IJ SOLN
INTRAMUSCULAR | Status: DC | PRN
Start: 2021-04-24 — End: 2021-04-24
  Administered 2021-04-24: 10 mL

## 2021-04-24 MED ORDER — ORAL CARE MOUTH RINSE: 15.0000 mL | Freq: Once | OROMUCOSAL | Status: AC

## 2021-04-24 MED ORDER — SODIUM CHLORIDE 0.9 % IR SOLN
Status: DC | PRN
  Administered 2021-04-24: 1000 mL

## 2021-04-24 MED ORDER — FENTANYL CITRATE (PF) 100 MCG/2ML IJ SOLN: 25.0000 ug | INTRAMUSCULAR | Status: DC | PRN

## 2021-04-24 MED ORDER — LIDOCAINE 2% (20 MG/ML) 5 ML SYRINGE
INTRAMUSCULAR | Status: DC | PRN
Start: 2021-04-24 — End: 2021-04-24
  Administered 2021-04-24: 40 mg via INTRAVENOUS

## 2021-04-24 MED ORDER — BUPIVACAINE HCL (PF) 0.25 % IJ SOLN
INTRAMUSCULAR | Status: AC
  Filled 2021-04-24: qty 30

## 2021-04-24 MED ORDER — CHLORHEXIDINE GLUCONATE 0.12 % MT SOLN: 15.0000 mL | Freq: Once | OROMUCOSAL | Status: AC

## 2021-04-24 MED ORDER — FENTANYL CITRATE (PF) 250 MCG/5ML IJ SOLN
INTRAMUSCULAR | Status: AC
  Filled 2021-04-24: qty 5

## 2021-04-24 MED ORDER — 0.9 % SODIUM CHLORIDE (POUR BTL) OPTIME
TOPICAL | Status: DC | PRN
  Administered 2021-04-24: 1000 mL

## 2021-04-24 MED ORDER — CHLORHEXIDINE GLUCONATE 0.12 % MT SOLN
OROMUCOSAL | Status: AC
  Administered 2021-04-24: 15 mL via OROMUCOSAL
  Filled 2021-04-24: qty 15

## 2021-04-24 MED ORDER — EPHEDRINE SULFATE-NACL 50-0.9 MG/10ML-% IV SOSY
PREFILLED_SYRINGE | INTRAVENOUS | Status: DC | PRN
  Administered 2021-04-24: 10 mg via INTRAVENOUS
  Administered 2021-04-24: 5 mg via INTRAVENOUS

## 2021-04-24 MED ORDER — VANCOMYCIN HCL 1250 MG/250ML IV SOLN
1250.0000 mg | INTRAVENOUS | Status: DC
  Administered 2021-04-24 – 2021-04-25 (×2): 1250 mg via INTRAVENOUS
  Filled 2021-04-24 (×3): qty 250

## 2021-04-24 MED ORDER — MUPIROCIN 2 % EX OINT
1.0000 "application " | TOPICAL_OINTMENT | Freq: Two times a day (BID) | CUTANEOUS | Status: DC
  Administered 2021-04-25 – 2021-04-27 (×3): 1 via NASAL
  Filled 2021-04-24: qty 22

## 2021-04-24 MED ORDER — BUPIVACAINE HCL 0.25 % IJ SOLN
INTRAMUSCULAR | Status: DC | PRN
Start: 2021-04-24 — End: 2021-04-24
  Administered 2021-04-24: 10 mL

## 2021-04-24 MED ORDER — PROPOFOL 500 MG/50ML IV EMUL
INTRAVENOUS | Status: DC | PRN
  Administered 2021-04-24: 50 ug/kg/min via INTRAVENOUS

## 2021-04-24 MED ORDER — LIDOCAINE 2% (20 MG/ML) 5 ML SYRINGE
INTRAMUSCULAR | Status: AC
  Filled 2021-04-24: qty 5

## 2021-04-24 SURGICAL SUPPLY — 50 items
APL PRP STRL LF DISP 70% ISPRP (MISCELLANEOUS) ×1
APL SKNCLS STERI-STRIP NONHPOA (GAUZE/BANDAGES/DRESSINGS) ×1
BAG COUNTER SPONGE SURGICOUNT (BAG) ×3 IMPLANT
BAG SPNG CNTER NS LX DISP (BAG) ×1
BENZOIN TINCTURE PRP APPL 2/3 (GAUZE/BANDAGES/DRESSINGS) ×3 IMPLANT
BLADE SURG 15 STRL LF DISP TIS (BLADE) ×4 IMPLANT
BLADE SURG 15 STRL SS (BLADE) ×4
BNDG CMPR 9X4 STRL LF SNTH (GAUZE/BANDAGES/DRESSINGS) ×1
BNDG COHESIVE 6X5 TAN NS LF (GAUZE/BANDAGES/DRESSINGS) ×1 IMPLANT
BNDG ELASTIC 3X5.8 VLCR STR LF (GAUZE/BANDAGES/DRESSINGS) ×1 IMPLANT
BNDG ELASTIC 4X5.8 VLCR STR LF (GAUZE/BANDAGES/DRESSINGS) ×3 IMPLANT
BNDG ESMARK 4X9 LF (GAUZE/BANDAGES/DRESSINGS) ×3 IMPLANT
BNDG GAUZE ELAST 4 BULKY (GAUZE/BANDAGES/DRESSINGS) ×6 IMPLANT
CANISTER SUCT 3000ML PPV (MISCELLANEOUS) ×1 IMPLANT
CHLORAPREP W/TINT 26 (MISCELLANEOUS) ×3 IMPLANT
CNTNR URN SCR LID CUP LEK RST (MISCELLANEOUS) ×2 IMPLANT
CONT SPEC 4OZ STRL OR WHT (MISCELLANEOUS) ×2
COVER SURGICAL LIGHT HANDLE (MISCELLANEOUS) ×6 IMPLANT
CUFF TOURN SGL QUICK 18X4 (TOURNIQUET CUFF) ×3 IMPLANT
DRAPE SURG 17X23 STRL (DRAPES) ×3 IMPLANT
DRSG PAD ABDOMINAL 8X10 ST (GAUZE/BANDAGES/DRESSINGS) ×3 IMPLANT
ELECT NDL TIP 2.8 STRL (NEEDLE) IMPLANT
ELECT NEEDLE TIP 2.8 STRL (NEEDLE) IMPLANT
ELECT REM PT RETURN 9FT ADLT (ELECTROSURGICAL)
ELECTRODE REM PT RTRN 9FT ADLT (ELECTROSURGICAL) IMPLANT
GAUZE PACKING IODOFORM 1/4X15 (PACKING) ×1 IMPLANT
GAUZE SPONGE 4X4 12PLY STRL (GAUZE/BANDAGES/DRESSINGS) ×3 IMPLANT
GLOVE SURG ENC MOIS LTX SZ6.5 (GLOVE) ×3 IMPLANT
GLOVE SURG UNDER POLY LF SZ6.5 (GLOVE) ×4 IMPLANT
GOWN STRL REUS W/ TWL LRG LVL3 (GOWN DISPOSABLE) ×4 IMPLANT
GOWN STRL REUS W/ TWL XL LVL3 (GOWN DISPOSABLE) IMPLANT
GOWN STRL REUS W/TWL LRG LVL3 (GOWN DISPOSABLE) ×6 IMPLANT
GOWN STRL REUS W/TWL XL LVL3 (GOWN DISPOSABLE) ×2
KIT BASIN OR (CUSTOM PROCEDURE TRAY) ×3 IMPLANT
KIT TURNOVER KIT B (KITS) ×3 IMPLANT
NDL BIOPSY JAMSHIDI 8X6 (NEEDLE) IMPLANT
NDL HYPO 25GX1X1/2 BEV (NEEDLE) ×2 IMPLANT
NEEDLE BIOPSY JAMSHIDI 8X6 (NEEDLE) ×2 IMPLANT
NEEDLE HYPO 25GX1X1/2 BEV (NEEDLE) ×2 IMPLANT
NS IRRIG 1000ML POUR BTL (IV SOLUTION) ×3 IMPLANT
PACK ORTHO EXTREMITY (CUSTOM PROCEDURE TRAY) ×3 IMPLANT
PAD ABD 8X10 STRL (GAUZE/BANDAGES/DRESSINGS) ×1 IMPLANT
PAD ARMBOARD 7.5X6 YLW CONV (MISCELLANEOUS) ×6 IMPLANT
STRIP CLOSURE SKIN 1/2X4 (GAUZE/BANDAGES/DRESSINGS) ×3 IMPLANT
SUT ETHILON 3 0 PS 1 (SUTURE) ×2 IMPLANT
SYR CONTROL 10ML LL (SYRINGE) ×3 IMPLANT
TOWEL GREEN STERILE (TOWEL DISPOSABLE) ×3 IMPLANT
TOWEL GREEN STERILE FF (TOWEL DISPOSABLE) ×3 IMPLANT
TUBE IRRIGATION SET MISONIX (TUBING) ×1 IMPLANT
YANKAUER SUCT BULB TIP NO VENT (SUCTIONS) ×1 IMPLANT

## 2021-04-24 NOTE — Progress Notes (Signed)
[facility]  Date of Admission:  04/22/2021      Total days of antibiotics 2  Vancomycin 1/31 >>            ASSESSMENT: Tammy Good is a 71 y.o. female with cellulitis, septic tendonitis with possible abscess in the left ankle/foot complicated by MRSA bacteremia. Transthoracic echo in process. Would check TEE for her to ensure no endocarditis to help with duration of IV treatment recommendations if TTE is not conclusive.  Will repeat blood cultures this evening.  Follow for OR findings/intervention.  Please hold on PICC Line for now until we ensure her bacteremia has cleared.  No other signs of metastatic infection at this point to be concerned about. Continue on vancomycin. Follow kidney function and levels for adjustments.    PLAN: Blood cultures this evening Follow ortho intervention Continue vancomycin  Follow TTE --> if negative please consult cardiology for TEE    Principal Problem:   Cellulitis Active Problems:   Panhypopituitarism ([place])   Hyperlipidemia associated with type 2 diabetes mellitus (HCC)   CAD S/P percutaneous coronary angioplasty   Essential hypertension   Obesity (BMI 30-39.9)   PAD (peripheral artery disease) (HCC)   MRSA bacteremia   Thrombocytosis    atorvastatin  40 mg Oral q1800   dexamethasone  0.5 mg Oral q morning   insulin aspart  0-15 Units Subcutaneous TID WC   insulin glargine-yfgn  23 Units Subcutaneous QHS   levothyroxine  125 mcg Oral QAC breakfast   losartan  25 mg Oral q morning   metoprolol succinate  25 mg Oral BID   pantoprazole  40 mg Oral BID AC   sodium chloride flush  3 mL Intravenous Q12H   spironolactone  25 mg Oral q morning    SUBJECTIVE: Doing well today - no new concerns or complaints. Planning surgery this evening for debriding ankle with podiatry.    Review of Systems: Review of Systems  Constitutional:  Negative for chills, fever, malaise/fatigue and weight  loss.  HENT:  Negative for sore throat.        No dental problems  Respiratory:  Negative for cough and sputum production.   Cardiovascular:  Negative for chest pain and leg swelling.  Gastrointestinal:  Negative for abdominal pain, diarrhea and vomiting.  Genitourinary:  Negative for dysuria and flank pain.  Musculoskeletal:  Positive for joint pain. Negative for myalgias (left ankle) and neck pain.  Skin:  Negative for rash.  Neurological:  Negative for dizziness, tingling and headaches.  Psychiatric/Behavioral:  Negative for depression and substance abuse. The patient is not nervous/anxious and does not have insomnia.    Allergies  Allergen Reactions   Strawberry Extract Itching, Swelling and Anaphylaxis    Mouth swells and gets itchy    OBJECTIVE: Vitals:   04/24/21 0500 04/24/21 0515 04/24/21 0600 04/24/21 0700  BP: (!) 136/48  (!) 137/54 (!) 123/51  Pulse: 71 68 71 70  Resp: (!) 23 15 18 20   Temp:    98.5 F (36.9 C)  TempSrc:    Oral  SpO2: 97% 100% 98% 98%   There is no height or weight on file to calculate BMI.  Physical Exam Vitals reviewed.  Constitutional:      Appearance: Normal appearance. She is not ill-appearing.  HENT:     Mouth/Throat:     Mouth: Mucous membranes are moist.     Pharynx: Oropharynx is clear.  Eyes:     General: No scleral icterus. Cardiovascular:     Rate and Rhythm: Normal rate and regular rhythm.  Pulmonary:     Effort: Pulmonary effort is normal.  Neurological:     Mental Status: She is alert and oriented to person, place, and time.  Psychiatric:        Mood and Affect: Mood normal.        Thought Content: Thought content normal.  Getting Echo done currently at bedside    Lab Results Lab Results  Component Value Date   WBC 9.7 04/24/2021   HGB 7.9 (L) 04/24/2021   HCT 25.6 (L) 04/24/2021   MCV 88.6 04/24/2021   PLT 630 (H) 04/24/2021    Lab Results  Component Value Date   CREATININE 0.92 04/24/2021   BUN 13  04/24/2021   NA 135 04/24/2021   K 4.1 04/24/2021   CL 107 04/24/2021   CO2 21 (L) 04/24/2021    Lab Results  Component Value Date   ALT 13 04/22/2021   AST 15 04/22/2021   ALKPHOS 87 04/22/2021   BILITOT 0.6 04/22/2021     Microbiology: Recent Results (from the past 240 hour(s))  Blood culture (routine x 2)     Status: Abnormal (Preliminary result)   Collection Time: 04/22/21  5:33 PM   Specimen: BLOOD  Result Value Ref Range Status   Specimen Description BLOOD SITE NOT SPECIFIED  Final   Special Requests   Final    BOTTLES DRAWN AEROBIC AND ANAEROBIC Blood Culture adequate volume   Culture  Setup Time   Final    GRAM POSITIVE COCCI IN CLUSTERS IN BOTH AEROBIC AND ANAEROBIC BOTTLES CRITICAL RESULT CALLED TO, READ BACK BY AND VERIFIED WITH: PHARMD JEREMY F. [id number] FCP    Culture (A)  Final    STAPHYLOCOCCUS AUREUS SUSCEPTIBILITIES TO FOLLOW Performed at [facility], [place] [address], [place], [place] [address]    Report Status PENDING  Incomplete  Blood Culture ID Panel (Reflexed)     Status: Abnormal   Collection Time: 04/22/21  5:33 PM  Result Value Ref Range Status   Enterococcus faecalis NOT DETECTED NOT DETECTED Final   Enterococcus Faecium NOT DETECTED NOT DETECTED Final   Listeria monocytogenes NOT DETECTED NOT DETECTED Final   Staphylococcus species DETECTED (A) NOT DETECTED Final    Comment: CRITICAL RESULT CALLED TO, READ BACK BY AND VERIFIED WITH: PHARMD JEREMY F. [id number] FCP    Staphylococcus aureus (BCID) DETECTED (A) NOT DETECTED Final    Comment: Methicillin (oxacillin)-resistant Staphylococcus aureus (MRSA). MRSA is predictably resistant to beta-lactam antibiotics (except ceftaroline). Preferred therapy is vancomycin unless clinically contraindicated. Patient requires contact precautions if  hospitalized. CRITICAL RESULT CALLED TO, READ BACK BY AND VERIFIED WITH: PHARMD JEREMY F. [phone] [id number] FCP    Staphylococcus epidermidis NOT DETECTED  NOT DETECTED Final   Staphylococcus lugdunensis NOT DETECTED NOT DETECTED Final   Streptococcus species NOT DETECTED NOT DETECTED Final   Streptococcus agalactiae NOT DETECTED NOT DETECTED Final   Streptococcus pneumoniae NOT DETECTED NOT DETECTED Final   Streptococcus pyogenes NOT DETECTED NOT DETECTED Final   A.calcoaceticus-baumannii NOT DETECTED NOT DETECTED Final   Bacteroides fragilis NOT DETECTED NOT DETECTED Final   Enterobacterales NOT DETECTED NOT DETECTED Final   Enterobacter cloacae complex NOT DETECTED NOT DETECTED Final   Escherichia coli NOT DETECTED NOT DETECTED Final   Klebsiella aerogenes NOT DETECTED NOT DETECTED Final   Klebsiella oxytoca NOT DETECTED NOT DETECTED Final  Klebsiella pneumoniae NOT DETECTED NOT DETECTED Final   Proteus species NOT DETECTED NOT DETECTED Final   Salmonella species NOT DETECTED NOT DETECTED Final   Serratia marcescens NOT DETECTED NOT DETECTED Final   Haemophilus influenzae NOT DETECTED NOT DETECTED Final   Neisseria meningitidis NOT DETECTED NOT DETECTED Final   Pseudomonas aeruginosa NOT DETECTED NOT DETECTED Final   Stenotrophomonas maltophilia NOT DETECTED NOT DETECTED Final   Candida albicans NOT DETECTED NOT DETECTED Final   Candida auris NOT DETECTED NOT DETECTED Final   Candida glabrata NOT DETECTED NOT DETECTED Final   Candida krusei NOT DETECTED NOT DETECTED Final   Candida parapsilosis NOT DETECTED NOT DETECTED Final   Candida tropicalis NOT DETECTED NOT DETECTED Final   Cryptococcus neoformans/gattii NOT DETECTED NOT DETECTED Final   Meth resistant mecA/C and MREJ DETECTED (A) NOT DETECTED Final    Comment: CRITICAL RESULT CALLED TO, READ BACK BY AND VERIFIED WITH: PHARMD JEREMY F. [id number] FCP Performed at [facility], [address], [place], [place] [address]   Resp Panel by RT-PCR (Flu A&B, Covid) Nasopharyngeal Swab     Status: None   Collection Time: 04/23/21  9:08 AM   Specimen: Nasopharyngeal Swab;  Nasopharyngeal(NP) swabs in vial transport medium  Result Value Ref Range Status   SARS Coronavirus 2 by RT PCR NEGATIVE NEGATIVE Final    Comment: (NOTE) SARS-CoV-2 target nucleic acids are NOT DETECTED.  The SARS-CoV-2 RNA is generally detectable in upper respiratory specimens during the acute phase of infection. The lowest concentration of SARS-CoV-2 viral copies this assay can detect is 138 copies/mL. A negative result does not preclude SARS-Cov-2 infection and should not be used as the sole basis for treatment or other patient management decisions. A negative result may occur with  improper specimen collection/handling, submission of specimen other than nasopharyngeal swab, presence of viral mutation(s) within the areas targeted by this assay, and inadequate number of viral copies(<138 copies/mL). A negative result must be combined with clinical observations, patient history, and epidemiological information. The expected result is Negative.  Fact Sheet for Patients:  EntrepreneurPulse.com.au  Fact Sheet for Healthcare Providers:  IncredibleEmployment.be  This test is no t yet approved or cleared by the [place] FDA and  has been authorized for detection and/or diagnosis of SARS-CoV-2 by FDA under an Emergency Use Authorization (EUA). This EUA will remain  in effect (meaning this test can be used) for the duration of the COVID-19 declaration under Section 564(b)(1) of the Act, 21 U.S.C.section 360bbb-3(b)(1), unless the authorization is terminated  or revoked sooner.       Influenza A by PCR NEGATIVE NEGATIVE Final   Influenza B by PCR NEGATIVE NEGATIVE Final    Comment: (NOTE) The Xpert Xpress SARS-CoV-2/FLU/RSV plus assay is intended as an aid in the diagnosis of influenza from Nasopharyngeal swab specimens and should not be used as a sole basis for treatment. Nasal washings and aspirates are unacceptable for Xpert Xpress  SARS-CoV-2/FLU/RSV testing.  Fact Sheet for Patients: EntrepreneurPulse.com.au  Fact Sheet for Healthcare Providers: IncredibleEmployment.be  This test is not yet approved or cleared by the [place] FDA and has been authorized for detection and/or diagnosis of SARS-CoV-2 by FDA under an Emergency Use Authorization (EUA). This EUA will remain in effect (meaning this test can be used) for the duration of the COVID-19 declaration under Section 564(b)(1) of the Act, 21 U.S.C. section 360bbb-3(b)(1), unless the authorization is terminated or revoked.  Performed at [facility], [place]  [address] [place], [place] [address]     Janene Madeira, MSN, NP-C [facility] [place] Pager: [phone]  04/24/2021  11:22 AM

## 2021-04-24 NOTE — Progress Notes (Signed)
Pt arrived to the unit via stretcher and transferred to bed without difficulty.  Pt is stable.  Telemetry placed.  NAD note.

## 2021-04-24 NOTE — Progress Notes (Signed)
PROGRESS NOTE    Tammy Good  BSJ:[id number] DOB: 09/30/1950 DOA: 04/22/2021 PCP: [name], MD   Brief Narrative:  HPI: Tammy Good is a 71 y.o. female with medical history significant of hyperlipidemia, CAD s/p PCI, DM type II, PAD, panhypopituitarism, and osteomyelitis s/p amputation of the left fifth toe who presents for left foot pain and swelling.  She had been initially seen in Dr. Jess Barters office on 1/9 for left foot and leg pain after falling a couple weeks prior.  At that time she states x-rays were done of her leg and she was prescribed compression socks which she wore approximately 2 days, but thereafter noticed a blister form on the lateral aspect of her foot.  She called his office and was told to use silver sulfadiazine cream on the wound and to keep it wrapped.  However, since that time she reports that she has had worsening pain and swelling in the foot with redness present.  Bearing weight on the affected leg worsens pain.  She had a procedure done with [name] of vascular surgery on 1/26.  Following the procedure and there was report of some episodes of nausea and vomiting, but patient reports that has since resolved.  She then followed up with orthopedics in their office on 1/27.  She kept thinking that her foot was infected and told her providers doing the same appointments, but had not been placed on antibiotics for treatment.  Denies having any fevers, but thinks she may have had some chills 1 day last week.  Patient also notes issues with constipation for which she takes stool softeners intermittently, but will develop diarrhea.  Stools have been dark, but she denies seeing any blood and relates it to her eating prunes.  She followed up with Dr. Earleen Newport at the podiatry clinic yesterday due to her concerns of infection foot, and had been sent to the ED for further evaluation.   ED Course: Upon admission into the emergency department patient was noted to be afebrile  with respirations 16-22, blood pressures 98/57-131/56, and all other vital signs maintained.  Labs significant for WBC 13.7, hemoglobin 9.8, platelets 831, sodium 133, BUN 13, creatinine 1.13, albumin 2.2, and lactic acid 1.6.  X-rays noted diffuse soft tissue swelling of the left foot without radiographic evidence of osteomyelitis.  Dr. Cannon Kettle of podiatry have been consulted.  Patient was given 1 L normal saline IV fluids, vancomycin, and Zosyn.  MRI of the foot was ordered.    Assessment & Plan:   Principal Problem:   Cellulitis Active Problems:   Panhypopituitarism ([place])   Hyperlipidemia associated with type 2 diabetes mellitus (HCC)   CAD S/P percutaneous coronary angioplasty   Essential hypertension   Obesity (BMI 30-39.9)   PAD (peripheral artery disease) (HCC)   MRSA bacteremia   Thrombocytosis   Sepsis secondary to diabetic foot infection/cellulitis of the left foot/tenosynovitis/possible early osteomyelitis of distal fibula/MRSA bacteremia: Patient meets sepsis criteria based on tachypnea and leukocytosis.  Patient presents for a nonhealing wound and pain on the lateral aspect of the left foot since the beginning of this month.  Patient had initially been started on empiric antibiotics of vancomycin, metronidazole, and cefepime however blood culture showed MRSA, she was transition to vancomycin per ID recommendations.  ID formally consulted. -Follow-up MRI of the left foot and ankle-moderate tenosynovitis appreciated with signs of early osteomyelitis.  Seen by podiatry.  Plan for surgical intervention today.  Appreciate ID and podiatry and will defer further  management to them.  Paroxysmal  atrial fibrillation on chronic anticoagulation: Patient appears to be in sinus rhythm at this time. Heparin per pharmacy in case of possible need of procedure.  Continue Toprol-XL.   Normocytic anemia: Hemoglobin is stable.   Peripheral vascular disease  CAD -Held Plavix due to possible need of  procedure and or bleeding -Continue statin   Diabetes mellitus type 2:   Home regimen includes glargine 46 units nightly with NovoLog 3-8 units before every meal with meals.  Started on long-acting insulin 23 units since she had to be n.p.o. last night, blood sugar controlled.  Continue SSI and adjust insulin as needed.   Panhypopituitarism  -Continue levothyroxine and Decadron  Essential hypertension: Home blood pressure regimen includes losartan 25 mg every morning, metoprolol succinate 25 mg twice daily, and spironolactone 25 mg daily.  Blood pressure controlled on home regimen.  Thrombocytosis: Acute.  Platelet count elevated at 831.  Suspect secondary to above.  Improving.  Monitor   Hyperlipidemia: Associated with diabetes. -Continue atorvastatin   Hypoalbuminemia: Albumin noted to be 2.2. -Check prealbumin   GERD  -Continue Protonix   Obesity: BMI 34.61 kg/m2  DVT prophylaxis:    Code Status: Full Code  Family Communication:  None present at bedside.  Plan of care discussed with patient in length and he/she verbalized understanding and agreed with it.  Status is: Inpatient Remains inpatient appropriate because: Needs surgical intervention for diabetic foot/osteomyelitis  Planned Discharge Destination: Home with Home Health         Estimated body mass index is 34.61 kg/m as calculated from the following:   Height as of 04/18/21: 5\' 7"  (1.702 m).   Weight as of 04/18/21: 100.2 kg.    Nutritional Assessment: There is no height or weight on file to calculate BMI.. Seen by dietician.  I agree with the assessment and plan as outlined below: Nutrition Status:        . Skin Assessment: I have examined the patient's skin and I agree with the wound assessment as performed by the wound care RN as outlined below:    Consultants:  Podiatry ID Procedures:  None  Antimicrobials:  Anti-infectives (From admission, onward)    Start     Dose/Rate Route Frequency  Ordered Stop   04/24/21 1100  vancomycin (VANCOCIN) IVPB 1000 mg/200 mL premix  Status:  Discontinued        1,000 mg 200 mL/hr over 60 Minutes Intravenous Every 24 hours 04/23/21 0908 04/24/21 0819   04/24/21 1100  vancomycin (VANCOREADY) IVPB 1250 mg/250 mL        1,250 mg 166.7 mL/hr over 90 Minutes Intravenous Every 24 hours 04/24/21 0819     04/23/21 1800  ceFEPIme (MAXIPIME) 2 g in sodium chloride 0.9 % 100 mL IVPB  Status:  Discontinued        2 g 200 mL/hr over 30 Minutes Intravenous Every 12 hours 04/23/21 1117 04/23/21 1410   04/23/21 1115  metroNIDAZOLE (FLAGYL) tablet 500 mg  Status:  Discontinued        500 mg Oral Every 12 hours 04/23/21 1101 04/23/21 1410   04/23/21 0915  piperacillin-tazobactam (ZOSYN) IVPB 3.375 g        3.375 g 100 mL/hr over 30 Minutes Intravenous  Once 04/23/21 0903 04/23/21 1014   04/23/21 0915  vancomycin (VANCOREADY) IVPB 1500 mg/300 mL        1,500 mg 150 mL/hr over 120 Minutes Intravenous  Once 04/23/21 0908 04/23/21 1356  Subjective: Seen and examined this morning in the ED.  Patient states that she is feeling better with pain controlled.  No other complaint.  She was aware of the surgical plans.  Objective: Vitals:   04/24/21 0500 04/24/21 0515 04/24/21 0600 04/24/21 0700  BP: (!) 136/48  (!) 137/54 (!) 123/51  Pulse: 71 68 71 70  Resp: (!) 23 15 18 20   Temp:    98.5 F (36.9 C)  TempSrc:    Oral  SpO2: 97% 100% 98% 98%    Intake/Output Summary (Last 24 hours) at 04/24/2021 1206 Last data filed at 04/24/2021 1156 Gross per 24 hour  Intake 1418.52 ml  Output 1100 ml  Net 318.52 ml   There were no vitals filed for this visit.  Examination:  General exam: Appears calm and comfortable  Respiratory system: Clear to auscultation. Respiratory effort normal. Cardiovascular system: S1 & S2 heard, RRR. No JVD, murmurs, rubs, gallops or clicks. No pedal edema. Gastrointestinal system: Abdomen is nondistended, soft and nontender.  No organomegaly or masses felt. Normal bowel sounds heard. Central nervous system: Alert and oriented. No focal neurological deficits. Extremities: Symmetric 5 x 5 power. Skin: Shallow ulceration on the lateral aspect of the left foot with erythema. Psychiatry: Judgement and insight appear normal. Mood & affect appropriate.    Data Reviewed: I have personally reviewed following labs and imaging studies  CBC: Recent Labs  Lab 04/18/21 0549 04/22/21 1739 04/23/21 1241 04/23/21 1530 04/24/21 0449  WBC  --  13.7*  --   --  9.7  NEUTROABS  --  12.2*  --   --   --   HGB 10.9* 9.5* 8.4* 11.4* 7.9*  HCT 32.0* 30.9* 28.0* 37.0 25.6*  MCV  --  89.3  --   --  88.6  PLT  --  831*  --   --  885*   Basic Metabolic Panel: Recent Labs  Lab 04/18/21 0549 04/22/21 1739 04/24/21 0449  NA 139 133* 135  K 3.9 4.2 4.1  CL 103 100 107  CO2  --  23 21*  GLUCOSE 99 185* 144*  BUN 10 13 13   CREATININE 0.90 1.13* 0.92  CALCIUM  --  8.6* 8.1*   GFR: Estimated Creatinine Clearance: 71.1 mL/min (by C-G formula based on SCr of 0.92 mg/dL). Liver Function Tests: Recent Labs  Lab 04/22/21 1739  AST 15  ALT 13  ALKPHOS 87  BILITOT 0.6  PROT 7.9  ALBUMIN 2.2*   No results for input(s): LIPASE, AMYLASE in the last 168 hours. No results for input(s): AMMONIA in the last 168 hours. Coagulation Profile: No results for input(s): INR, PROTIME in the last 168 hours. Cardiac Enzymes: No results for input(s): CKTOTAL, CKMB, CKMBINDEX, TROPONINI in the last 168 hours. BNP (last 3 results) No results for input(s): PROBNP in the last 8760 hours. HbA1C: Recent Labs    04/23/21 1241  HGBA1C 7.2*   CBG: Recent Labs  Lab 04/18/21 0903 04/23/21 1307 04/23/21 1720 04/23/21 2139 04/24/21 0809  GLUCAP 88 105* 161* 137* 149*   Lipid Profile: No results for input(s): CHOL, HDL, LDLCALC, TRIG, CHOLHDL, LDLDIRECT in the last 72 hours. Thyroid Function Tests: No results for input(s): TSH, T4TOTAL,  FREET4, T3FREE, THYROIDAB in the last 72 hours. Anemia Panel: No results for input(s): VITAMINB12, FOLATE, FERRITIN, TIBC, IRON, RETICCTPCT in the last 72 hours. Sepsis Labs: Recent Labs  Lab 04/22/21 1739  LATICACIDVEN 1.6    Recent Results (from the past 240 hour(s))  Blood culture (routine x 2)     Status: Abnormal (Preliminary result)   Collection Time: 04/22/21  5:33 PM   Specimen: BLOOD  Result Value Ref Range Status   Specimen Description BLOOD SITE NOT SPECIFIED  Final   Special Requests   Final    BOTTLES DRAWN AEROBIC AND ANAEROBIC Blood Culture adequate volume   Culture  Setup Time   Final    GRAM POSITIVE COCCI IN CLUSTERS IN BOTH AEROBIC AND ANAEROBIC BOTTLES CRITICAL RESULT CALLED TO, READ BACK BY AND VERIFIED WITH: PHARMD JEREMY F. [id number] FCP    Culture (A)  Final    STAPHYLOCOCCUS AUREUS SUSCEPTIBILITIES TO FOLLOW Performed at [facility], [place] [address] [place], [place] [address]    Report Status PENDING  Incomplete  Blood Culture ID Panel (Reflexed)     Status: Abnormal   Collection Time: 04/22/21  5:33 PM  Result Value Ref Range Status   Enterococcus faecalis NOT DETECTED NOT DETECTED Final   Enterococcus Faecium NOT DETECTED NOT DETECTED Final   Listeria monocytogenes NOT DETECTED NOT DETECTED Final   Staphylococcus species DETECTED (A) NOT DETECTED Final    Comment: CRITICAL RESULT CALLED TO, READ BACK BY AND VERIFIED WITH: PHARMD JEREMY F. [id number] FCP    Staphylococcus aureus (BCID) DETECTED (A) NOT DETECTED Final    Comment: Methicillin (oxacillin)-resistant Staphylococcus aureus (MRSA). MRSA is predictably resistant to beta-lactam antibiotics (except ceftaroline). Preferred therapy is vancomycin unless clinically contraindicated. Patient requires contact precautions if  hospitalized. CRITICAL RESULT CALLED TO, READ BACK BY AND VERIFIED WITH: PHARMD JEREMY F. [id number] FCP    Staphylococcus epidermidis NOT DETECTED NOT DETECTED  Final   Staphylococcus lugdunensis NOT DETECTED NOT DETECTED Final   Streptococcus species NOT DETECTED NOT DETECTED Final   Streptococcus agalactiae NOT DETECTED NOT DETECTED Final   Streptococcus pneumoniae NOT DETECTED NOT DETECTED Final   Streptococcus pyogenes NOT DETECTED NOT DETECTED Final   A.calcoaceticus-baumannii NOT DETECTED NOT DETECTED Final   Bacteroides fragilis NOT DETECTED NOT DETECTED Final   Enterobacterales NOT DETECTED NOT DETECTED Final   Enterobacter cloacae complex NOT DETECTED NOT DETECTED Final   Escherichia coli NOT DETECTED NOT DETECTED Final   Klebsiella aerogenes NOT DETECTED NOT DETECTED Final   Klebsiella oxytoca NOT DETECTED NOT DETECTED Final   Klebsiella pneumoniae NOT DETECTED NOT DETECTED Final   Proteus species NOT DETECTED NOT DETECTED Final   Salmonella species NOT DETECTED NOT DETECTED Final   Serratia marcescens NOT DETECTED NOT DETECTED Final   Haemophilus influenzae NOT DETECTED NOT DETECTED Final   Neisseria meningitidis NOT DETECTED NOT DETECTED Final   Pseudomonas aeruginosa NOT DETECTED NOT DETECTED Final   Stenotrophomonas maltophilia NOT DETECTED NOT DETECTED Final   Candida albicans NOT DETECTED NOT DETECTED Final   Candida auris NOT DETECTED NOT DETECTED Final   Candida glabrata NOT DETECTED NOT DETECTED Final   Candida krusei NOT DETECTED NOT DETECTED Final   Candida parapsilosis NOT DETECTED NOT DETECTED Final   Candida tropicalis NOT DETECTED NOT DETECTED Final   Cryptococcus neoformans/gattii NOT DETECTED NOT DETECTED Final   Meth resistant mecA/C and MREJ DETECTED (A) NOT DETECTED Final    Comment: CRITICAL RESULT CALLED TO, READ BACK BY AND VERIFIED WITH: PHARMD JEREMY F. [id number] FCP Performed at [facility], [address], [place],  [address]   Resp Panel by RT-PCR (Flu A&B, Covid) Nasopharyngeal Swab     Status: None   Collection Time: 04/23/21  9:08 AM  Specimen: Nasopharyngeal Swab;  Nasopharyngeal(NP) swabs in vial transport medium  Result Value Ref Range Status   SARS Coronavirus 2 by RT PCR NEGATIVE NEGATIVE Final    Comment: (NOTE) SARS-CoV-2 target nucleic acids are NOT DETECTED.  The SARS-CoV-2 RNA is generally detectable in upper respiratory specimens during the acute phase of infection. The lowest concentration of SARS-CoV-2 viral copies this assay can detect is 138 copies/mL. A negative result does not preclude SARS-Cov-2 infection and should not be used as the sole basis for treatment or other patient management decisions. A negative result may occur with  improper specimen collection/handling, submission of specimen other than nasopharyngeal swab, presence of viral mutation(s) within the areas targeted by this assay, and inadequate number of viral copies(<138 copies/mL). A negative result must be combined with clinical observations, patient history, and epidemiological information. The expected result is Negative.  Fact Sheet for Patients:  EntrepreneurPulse.com.au  Fact Sheet for Healthcare Providers:  IncredibleEmployment.be  This test is no t yet approved or cleared by the [place] FDA and  has been authorized for detection and/or diagnosis of SARS-CoV-2 by FDA under an Emergency Use Authorization (EUA). This EUA will remain  in effect (meaning this test can be used) for the duration of the COVID-19 declaration under Section 564(b)(1) of the Act, 21 U.S.C.section 360bbb-3(b)(1), unless the authorization is terminated  or revoked sooner.       Influenza A by PCR NEGATIVE NEGATIVE Final   Influenza B by PCR NEGATIVE NEGATIVE Final    Comment: (NOTE) The Xpert Xpress SARS-CoV-2/FLU/RSV plus assay is intended as an aid in the diagnosis of influenza from Nasopharyngeal swab specimens and should not be used as a sole basis for treatment. Nasal washings and aspirates are unacceptable for Xpert Xpress  SARS-CoV-2/FLU/RSV testing.  Fact Sheet for Patients: EntrepreneurPulse.com.au  Fact Sheet for Healthcare Providers: IncredibleEmployment.be  This test is not yet approved or cleared by the [place] FDA and has been authorized for detection and/or diagnosis of SARS-CoV-2 by FDA under an Emergency Use Authorization (EUA). This EUA will remain in effect (meaning this test can be used) for the duration of the COVID-19 declaration under Section 564(b)(1) of the Act, 21 U.S.C. section 360bbb-3(b)(1), unless the authorization is terminated or revoked.  Performed at [facility], [place] [address] [place], [place] [address]      Radiology Studies: DG Tibia/Fibula Left  Result Date: 04/22/2021 CLINICAL DATA:  concern for osteo EXAM: LEFT TIBIA AND FIBULA - 2 VIEW COMPARISON:  None. FINDINGS: No cortical erosion or destruction. Couple oval pericentimeter ossific densities within the lateral tibiofemoral joint space. There is no evidence of fracture or other focal bone lesions. Visualized knee and ankle grossly unremarkable. Subcutaneus soft tissue edema of the ankle. Vascular calcifications. IMPRESSION: 1. No radiographic findings suggest acute osteomyelitis. No acute displaced fracture or dislocation. 2. Couple pericentimeter loose bodies within the lateral tibiofemoral joint space. 3. Subcutaneus soft tissue edema of the ankle. Consider dedicated ankle radiograph. Electronically Signed   By: Iven Finn M.D.   On: 04/22/2021 18:05   DG Ankle Complete Left  Result Date: 04/22/2021 CLINICAL DATA:  Left foot pain and swelling, left foot wound EXAM: LEFT ANKLE COMPLETE - 3+ VIEW COMPARISON:  04/19/2021 FINDINGS: Frontal, oblique, lateral views of the left ankle are obtained. There are no acute or destructive bony lesions. Postsurgical changes from prior fifth metatarsal amputation again noted. Stable small inferior calcaneal spur. There is diffuse  soft tissue swelling of the lower leg,  ankle, and hindfoot. No radiopaque foreign body. Stable soft tissue calcifications. IMPRESSION: 1. Stable diffuse soft tissue swelling. No radiographic evidence of osteomyelitis. Electronically Signed   By: Randa Ngo M.D.   On: 04/22/2021 18:00   MR FOOT LEFT WO CONTRAST  Result Date: 04/23/2021 CLINICAL DATA:  Left foot and ankle pain and swelling. Recent surgery. EXAM: MRI OF THE LEFT ANKLE WITHOUT CONTRAST; MRI OF THE LEFT FOOT WITHOUT CONTRAST TECHNIQUE: Multiplanar, multisequence MR imaging of the ankle and foot was performed. No intravenous contrast was administered. COMPARISON:  Left ankle x-rays dated April 22, 2021. Left foot x-rays dated April 19, 2021. MRI left foot dated May 24, 2019. FINDINGS: TENDONS Peroneal: Tendinosis and partial tears of the peroneal longus and brevis tendons with moderate amount of fluid in the tendon sheath. The tendon sheath appears to communicate with the focal soft tissue ulceration in the lateral distal lower leg (series 4, image 10). Posteromedial: Posterior tibial tendon intact. Flexor digitorum longus tendon intact. Flexor hallucis longus tendon intact. Anterior: Tibialis anterior tendon intact. Extensor hallucis longus tendon intact Extensor digitorum longus tendon intact. Achilles:  Intact. Plantar Fascia: Thickened proximal central band with small partial tear. LIGAMENTS Lateral: Anterior talofibular ligament intact, best appreciated on the sagittal and coronal images. Calcaneofibular ligament intact. Posterior talofibular ligament intact. Anterior and posterior tibiofibular ligaments intact. Medial: Deltoid ligament intact. Spring ligament intact. Lisfranc ligament intact. CARTILAGE Ankle Joint: Small joint effusion. Normal ankle mortise. No chondral defect. Subtalar Joints/Sinus Tarsi: Normal subtalar joints. Small posterior subtalar joint effusion. Partial loss of the normal fat within the sinus tarsi. Bones:  Patchy geographic increased marrow signal within the calcaneus, likely red marrow hyperplasia. Mildly increased marrow edema in the distal fibula adjacent to the peroneal tendons. Moderate second TMT joint osteoarthritis. Mild third through fifth TMT joint osteoarthritis. Prior fifth ray amputation. Soft Tissue: Diffuse soft tissue swelling. No soft tissue mass or fluid collection. IMPRESSION: 1. Tendinosis and partial tears of the peroneal longus and brevis tendons with moderate tenosynovitis, likely infectious given the tendon sheath appears to communicate with the focal soft tissue ulceration in the lateral distal lower leg. 2. Mildly increased marrow edema in the distal fibula adjacent to the peroneal tendons, concerning for early osteomyelitis given peroneal infection. 3. Prior fifth ray amputation. No evidence of osteomyelitis in the foot. Electronically Signed   By: Titus Dubin M.D.   On: 04/23/2021 12:24   MR ANKLE LEFT WO CONTRAST  Result Date: 04/23/2021 CLINICAL DATA:  Left foot and ankle pain and swelling. Recent surgery. EXAM: MRI OF THE LEFT ANKLE WITHOUT CONTRAST; MRI OF THE LEFT FOOT WITHOUT CONTRAST TECHNIQUE: Multiplanar, multisequence MR imaging of the ankle and foot was performed. No intravenous contrast was administered. COMPARISON:  Left ankle x-rays dated April 22, 2021. Left foot x-rays dated April 19, 2021. MRI left foot dated May 24, 2019. FINDINGS: TENDONS Peroneal: Tendinosis and partial tears of the peroneal longus and brevis tendons with moderate amount of fluid in the tendon sheath. The tendon sheath appears to communicate with the focal soft tissue ulceration in the lateral distal lower leg (series 4, image 10). Posteromedial: Posterior tibial tendon intact. Flexor digitorum longus tendon intact. Flexor hallucis longus tendon intact. Anterior: Tibialis anterior tendon intact. Extensor hallucis longus tendon intact Extensor digitorum longus tendon intact. Achilles:   Intact. Plantar Fascia: Thickened proximal central band with small partial tear. LIGAMENTS Lateral: Anterior talofibular ligament intact, best appreciated on the sagittal and coronal images. Calcaneofibular ligament intact. Posterior talofibular ligament  intact. Anterior and posterior tibiofibular ligaments intact. Medial: Deltoid ligament intact. Spring ligament intact. Lisfranc ligament intact. CARTILAGE Ankle Joint: Small joint effusion. Normal ankle mortise. No chondral defect. Subtalar Joints/Sinus Tarsi: Normal subtalar joints. Small posterior subtalar joint effusion. Partial loss of the normal fat within the sinus tarsi. Bones: Patchy geographic increased marrow signal within the calcaneus, likely red marrow hyperplasia. Mildly increased marrow edema in the distal fibula adjacent to the peroneal tendons. Moderate second TMT joint osteoarthritis. Mild third through fifth TMT joint osteoarthritis. Prior fifth ray amputation. Soft Tissue: Diffuse soft tissue swelling. No soft tissue mass or fluid collection. IMPRESSION: 1. Tendinosis and partial tears of the peroneal longus and brevis tendons with moderate tenosynovitis, likely infectious given the tendon sheath appears to communicate with the focal soft tissue ulceration in the lateral distal lower leg. 2. Mildly increased marrow edema in the distal fibula adjacent to the peroneal tendons, concerning for early osteomyelitis given peroneal infection. 3. Prior fifth ray amputation. No evidence of osteomyelitis in the foot. Electronically Signed   By: Titus Dubin M.D.   On: 04/23/2021 12:24    Scheduled Meds:  atorvastatin  40 mg Oral q1800   dexamethasone  0.5 mg Oral q morning   insulin aspart  0-15 Units Subcutaneous TID WC   insulin glargine-yfgn  23 Units Subcutaneous QHS   levothyroxine  125 mcg Oral QAC breakfast   losartan  25 mg Oral q morning   metoprolol succinate  25 mg Oral BID   pantoprazole  40 mg Oral BID AC   sodium chloride flush   3 mL Intravenous Q12H   spironolactone  25 mg Oral q morning   Continuous Infusions:  heparin 1,450 Units/hr (04/24/21 0924)   vancomycin 1,250 mg (04/24/21 1128)     LOS: 1 day   Time spent: 35 minutes  Darliss Cheney, MD [facility]  04/24/2021, 12:06 PM  Please page via [facility] and do not message via secure chat for urgent patient care matters. Secure chat can be used for non urgent patient care matters.  How to contact the [facility] Attending or Consulting provider [place] or covering provider during after hours [place], for this patient?  Check the care team in [facility] and look for a) attending/consulting [facility] provider listed and b) the [facility] team listed. Page or secure chat [phone]. Log into [email or web address] and use 's universal password to access. If you do not have the password, please contact the hospital operator. Locate the [facility] provider you are looking for under [facility] and page to a number that you can be directly reached. If you still have difficulty reaching the provider, please page the [facility] (Director on Call) for the Hospitalists listed on amion for assistance.

## 2021-04-24 NOTE — H&P (Signed)
Brief interval H&P Patient met bedside.  Consent obtained for left ankle incision and drainage with debridement of all infected soft tissue and bone biopsy.  Patient confirms n.p.o. past breakfast.  All questions regarding above procedure were answered no guarantees given or implied.  Patient to undergo above procedure.  Podiatry to follow.  Dr. Landis Martins On Call provider  [facility] [phone] office [phone] cell Available via secure chat

## 2021-04-24 NOTE — Op Note (Signed)
DATE: 04-24-21  SURGEON: Landis Martins, DPM  PREOPERATIVE DIAGNOSIS: Left ankle cellulitis abscess osteomyelitis POSTOPERATIVE DIAGNOSIS: Same  PROCEDURE PERFORMED: Left ankle incision and drainage with debridement of peroneal tendons and bone biopsy at fibula  HEMOSTASIS: Left high ankle tourniquet  ESTIMATED BLOOD LOSS:  76m  ANESTHESIA: Pre and postop MAC with local 20cc of 1:1 mixture 1% lidocaine and 0.5% marcaine plain  SPECIMENS:  Bone fibula  MICRO: Wound cultures  COMPLICATIONS:  None.   INDICATIONS FOR PROCEDURE:  This patient is a pleasant 71y.o. female patient seen for worsening left ankle infection MRI supportive of infected tenosynovitis along the peroneal tendons with tearing as well as localized abscess and osteomyelitis suspected of the fibula.  Patient agreed to surgery for left ankle incision and drainage with wound culture and bone biopsy. Risks and complications include but are not limited to infection, recurrence of symptoms, pain, numbness, wound dehiscence, delayed healing, as well as need for future surgery/amputation.  No guarantees were given or applied.  All questions were answered to the patients satisfaction, and the patient has consented to the above procedure.  All preoperative labs and H&P, medical clearances have been obtained and NPO status past 10 am has been confirmed.  PREPARATION FOR PROCEDURE:     The patient was brought to the operating room and placed on the operating table in supine position.  A pneumatic ankletourniquet was placed about the patient's left ankle but not yet inflated.  After the department of anesthesia had administered MAC anesthesia. A local block was administered and  The left foot and ankle was then scrubbed, prepped, and draped in the usual aseptic manner.  An Esmarch bandage was utilized to exsanguinate the patients Right foot and leg, and the pneumatic tourniquet was inflated to 250 mmHg.  PROCEDURE IN DETAIL:  At this  time, attention was directed to the patients left ankle where there was of note pre-existing wound at the lateral foot and lower leg with tenderness and fatty tissue exposed, using a 15 blade the lateral leg from the foot and ankle was incised at the previous wound sites through skin and subcutaneous tissue. Bloody and purulent drainage was expressed and then using tenotomy scissors all nonviable tissue was excised from the wound bed, the infection was noted to tracked along the peroneal tendons. Deep wound cultures were obtained and then pulse irrigation and debridement using misonix was performed. Following the distal end of the fibula was identified and using a Jamshidi needle 3 punch biopsies were obtained to send the specimen.  A few nylon suture was applied and then the remaining wound bed was packed open. The left foot and ankle was then dressed with 4 x 4 gauze, abd, Kerlix, and Coban and ACE.  At this time, the Right pneumatic tourniquet was deflated, and a positive hyperemic response was noted left digits.  The patient tolerated the procedure and anesthesia well.  Upon transfer to the recovery room, the patients vital signs were stable, and neurovascular status was intact.    Patient to be transferred back to medical floor to continue on IV antibiotics to tailor pending intra-op cultures.  Anticipate discharge to home versus skilled nursing facility in 2 days with home nursing and antibiotics.  Podiatry to follow  TLandis Martins DPM

## 2021-04-24 NOTE — Transfer of Care (Signed)
Immediate Anesthesia Transfer of Care Note  Patient: Tammy Good  Procedure(s) Performed: INCISION AND DRAINAGE (Left: Leg Lower) BONE BIOPSY (Left: Leg Lower)  Patient Location: PACU  Anesthesia Type:MAC  Level of Consciousness: awake  Airway & Oxygen Therapy: Patient Spontanous Breathing  Post-op Assessment: Report given to RN and Post -op Vital signs reviewed and stable  Post vital signs: Reviewed and stable  Last Vitals:  Vitals Value Taken Time  BP    Temp    Pulse    Resp    SpO2      Last Pain:  Vitals:   04/24/21 1632  TempSrc: Oral  PainSc:          Complications: No notable events documented.

## 2021-04-24 NOTE — Brief Op Note (Signed)
04/24/2021  11:20 PM  PATIENT:  Tammy Good  71 y.o. female  PRE-OPERATIVE DIAGNOSIS:  osteomyelitis, cellulitis, abscess  POST-OPERATIVE DIAGNOSIS:  osteomyelitis, cellulitis, abscess  PROCEDURE:  Procedure(s) with comments: INCISION AND DRAINAGE (Left) BONE BIOPSY (Left) - left ankle/fibula  SURGEON:  Surgeon(s) and Role:     Landis Martins, DPM - Primary  PHYSICIAN ASSISTANT:   ASSISTANTS: none   ANESTHESIA:   MAC  EBL:  74m   BLOOD ADMINISTERED:none  DRAINS: none   LOCAL MEDICATIONS USED:  MARCAINE    and BUPIVICAINE   SPECIMEN:  Source of Specimen:  Left ankle/fibula   DISPOSITION OF SPECIMEN:  PATHOLOGY  COUNTS:  YES  TOURNIQUET:   Total Tourniquet Time Documented: Calf (Left) - 46 minutes Total: Calf (Left) - 46 minutes   DICTATION: .Note written in EPIC  PLAN OF CARE:  Return to medical floor  PATIENT DISPOSITION:  PACU - hemodynamically stable.   Delay start of Pharmacological VTE agent (>24hrs) due to surgical blood loss or risk of bleeding: no

## 2021-04-24 NOTE — Plan of Care (Signed)

## 2021-04-24 NOTE — Progress Notes (Signed)
ANTICOAGULATION CONSULT NOTE -  Pharmacy Consult for IV heparin Indication: atrial fibrillation  Allergies  Allergen Reactions   Strawberry Extract Itching, Swelling and Anaphylaxis    Mouth swells and gets itchy    Patient Measurements:   Heparin Dosing Weight: 83.9 kg  Vital Signs: Temp: 98.5 F (36.9 C) (02/01 0700) Temp Source: Oral (02/01 0700) BP: 123/51 (02/01 0700) Pulse Rate: 70 (02/01 0700)  Labs: Recent Labs    04/22/21 1739 04/23/21 1241 04/23/21 1453 04/23/21 1530 04/23/21 2154 04/24/21 0449  HGB 9.5* 8.4*  --  11.4*  --  7.9*  HCT 30.9* 28.0*  --  37.0  --  25.6*  PLT 831*  --   --   --   --  630*  APTT  --   --  43*  --  73* 61*  HEPARINUNFRC  --   --  >1.10*  --   --  >1.10*  CREATININE 1.13*  --   --   --   --  0.92     Estimated Creatinine Clearance: 71.1 mL/min (by C-G formula based on SCr of 0.92 mg/dL).   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   Hyperlipidemia with target LDL less than 70    Hypothyroidism (acquired)    Mild essential hypertension    Obesity (BMI 30-39.9) 11/17/2013   PAD (peripheral artery disease) ([place]) 05/26/2019   05/26/19: Abd AoGram- BLE runoff -> L SFA orbital atherectomy - PTA w/ DEB & Stent 6 x 40 Luttonix (for focal dissection) - patent Pop A with 3 V runoff. LEA Dopplers 01/03/2020: RABI (prev) 0.68 (0.53)/ RTBI (prev) 0.58 (0.33); LABI (prev) 0.80 (0.64), LTBI (prev) 0.64 (0.51); R mSFA ~50-74%, L mSFA 50-74%. Patent Prox SFA stent < 49% stenosis   Panhypopituitarism (HCC)    ST elevation  myocardial infarction (STEMI) of inferior wall, subsequent episode of care ([place]) 08/2013   80% branch of D1, 40% mid AV groove circumflex, 100% RCA with subacute thrombus -- thrombus extending into RPA V with 100% occlusion after initial angioplasty of mid RCA ;; Post MI ECHO 6/9/'15: EF 50-55%, mild LVH with moderate HK of inferior wall, Gr1 DD, mild LA dilation; mildly reduced RV function   Assessment: 42 YOF presenting with foot infection.   AC: PMH significant for A-fib anticoagulated on apixaban. Last dose taken 1/30 @1000 . Pharmacy to dose IV heparin. aPTT down to subtherapeutic (61 sec) on gtt at 1350 units/hr. Hgb down to 7.9 (range 8.4-10.9 on admission). No issues with line or bleeding reported per RN.  ID:  MRSA bacteremia, ?osteo. Plan for I&D this evening. ID following. SCr improved to 0.92  1/31 vancomycin >> 1/31 cefepime x1 1/31 metronidazole x1 1/31 Zosyn x 1  1/31 Bcx:  GPC in clusters - MRSA identified.   Goal of Therapy:  Heparin level 0.3-0.7 units/ml aPTT 66-102 seconds Monitor platelets by anticoagulation protocol: Yes Vanc AUC 400-550    Plan:  Increase heparin to 1450 units/h F/u 6  hr aPTT Noted plan to OR ~2000 today Change Vancomycin to 1250 mg IV q 24h (eAUC 483, Goal AUC 400-550, SCr 0.9) Monitor renal function and levels at Css F/u TTE  Sherlon Handing, PharmD, BCPS Please see amion for complete clinical pharmacist phone list 04/24/2021 8:24 AM

## 2021-04-24 NOTE — Anesthesia Preprocedure Evaluation (Signed)
Anesthesia Evaluation  Patient identified by MRN, date of birth, ID band Patient awake    Reviewed: Allergy & Precautions, NPO status , Patient's Chart, lab work & pertinent test results, reviewed documented beta blocker date and time   Airway Mallampati: II  TM Distance: >3 FB     Dental  (+) Teeth Intact, Dental Advisory Given   Pulmonary former smoker,    breath sounds clear to auscultation       Cardiovascular hypertension, Pt. on medications and Pt. on home beta blockers + CAD, + Past MI, + Cardiac Stents and + Peripheral Vascular Disease   Rhythm:Regular Rate:Normal  Limited Echo 04/24/2021 1. Left ventricular ejection fraction, by estimation, is 40 to 45%. The left ventricle has mildly decreased function. There is mild concentric left ventricular hypertrophy. Left ventricular diastolic parameters are indeterminate.  2. The mitral valve was not well visualized. No evidence of mitral valve regurgitation.  3. The aortic valve is tricuspid.   Echo 10/2020 1. Left ventricular ejection fraction, by estimation, is 40 to 45%. The left ventricle has mildly decreased function. The left ventricle demonstrates regional wall motion abnormalities (see scoring diagram/findings for description). Left ventricular diastolic parameters are consistent with Grade I diastolic dysfunction (impaired relaxation). There is severe hypokinesis of the left ventricular, entire inferolateral wall and inferior wall.  2. Right ventricular systolic function is normal. The right ventricular size is normal. Tricuspid regurgitation signal is inadequate for assessing PA pressure.  3. Left atrial size was moderately dilated.  4. The mitral valve is grossly normal. No evidence of mitral valve regurgitation.  5. The aortic valve was not well visualized. Aortic valve regurgitation is not visualized.  6. The inferior vena cava is normal in size with greater than 50%  respiratory variability, suggesting right atrial pressure of 3 mmHg.    Neuro/Psych    GI/Hepatic negative GI ROS, Neg liver ROS,   Endo/Other  diabetesHypothyroidism   Renal/GU negative Renal ROS     Musculoskeletal   Abdominal (+) + obese,   Peds  Hematology  (+) anemia ,   Anesthesia Other Findings   Reproductive/Obstetrics                             Anesthesia Physical  Anesthesia Plan  ASA: 3  Anesthesia Plan: MAC   Post-op Pain Management: Minimal or no pain anticipated   Induction: Intravenous  PONV Risk Score and Plan: 2 and Propofol infusion, Ondansetron, Treatment may vary due to age or medical condition and TIVA  Airway Management Planned: Nasal Cannula and Simple Face Mask  Additional Equipment:   Intra-op Plan:   Post-operative Plan:   Informed Consent: I have reviewed the patients History and Physical, chart, labs and discussed the procedure including the risks, benefits and alternatives for the proposed anesthesia with the patient or authorized representative who has indicated his/her understanding and acceptance.     Dental advisory given  Plan Discussed with: CRNA  Anesthesia Plan Comments:         Anesthesia Quick Evaluation

## 2021-04-24 NOTE — ED Notes (Signed)
Provider at bedside

## 2021-04-24 NOTE — Progress Notes (Signed)
ANTICOAGULATION CONSULT NOTE -  Pharmacy Consult for IV heparin Indication: atrial fibrillation  Allergies  Allergen Reactions   Strawberry Extract Itching, Swelling and Anaphylaxis    Mouth swells and gets itchy    Patient Measurements: Height: 5\' 7"  (170.2 cm) Weight: 97.7 kg (215 lb 6.2 oz) IBW/kg (Calculated) : 61.6 Heparin Dosing Weight: 83.9 kg  Vital Signs: Temp: 98.1 F (36.7 C) (02/01 1222) Temp Source: Oral (02/01 1222) BP: 120/66 (02/01 1222) Pulse Rate: 68 (02/01 1222)  Labs: Recent Labs    04/22/21 1739 04/23/21 1241 04/23/21 1241 04/23/21 1453 04/23/21 1530 04/23/21 2154 04/24/21 0449 04/24/21 1519  HGB 9.5* 8.4*  --   --  11.4*  --  7.9*  --   HCT 30.9* 28.0*  --   --  37.0  --  25.6*  --   PLT 831*  --   --   --   --   --  630*  --   APTT  --   --    < > 43*  --  73* 61* 77*  HEPARINUNFRC  --   --   --  >1.10*  --   --  >1.10*  --   CREATININE 1.13*  --   --   --   --   --  0.92  --    < > = values in this interval not displayed.     Estimated Creatinine Clearance: 70.2 mL/min (by C-G formula based on SCr of 0.92 mg/dL).   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   Hyperlipidemia with target LDL less than 70    Hypothyroidism (acquired)    Mild essential hypertension    Obesity (BMI 30-39.9) 11/17/2013   PAD (peripheral artery disease) ([place]) 05/26/2019   05/26/19: Abd AoGram- BLE runoff -> L SFA orbital atherectomy - PTA w/ DEB & Stent 6 x 40 Luttonix (for focal dissection) - patent Pop A with 3 V runoff. LEA Dopplers  01/03/2020: RABI (prev) 0.68 (0.53)/ RTBI (prev) 0.58 (0.33); LABI (prev) 0.80 (0.64), LTBI (prev) 0.64 (0.51); R mSFA ~50-74%, L mSFA 50-74%. Patent Prox SFA stent < 49% stenosis   Panhypopituitarism (HCC)    ST elevation myocardial infarction (STEMI) of inferior wall, subsequent episode of care ([place]) 08/2013   80% branch of D1, 40% mid AV groove circumflex, 100% RCA with subacute thrombus -- thrombus extending into RPA V with 100% occlusion after initial angioplasty of mid RCA ;; Post MI ECHO 6/9/'15: EF 50-55%, mild LVH with moderate HK of inferior wall, Gr1 DD, mild LA dilation; mildly reduced RV function   Assessment: Tammy Good presenting with foot infection.   AC: PMH significant for A-fib anticoagulated on apixaban. Last dose taken 1/30 @1000 . Pharmacy to dose IV heparin. 2/1 @1519 -aPTT 77 sec on heparin 1450 units/hr.  ID:  MRSA bacteremia, ?osteo. Plan for I&D this evening. ID following. SCr improved to 0.92  1/31 vancomycin >> 1/31 cefepime x1 1/31 metronidazole x1 1/31 Zosyn x 1  1/31 Bcx:  GPC in clusters - MRSA identified.   Goal of Therapy:  Heparin level 0.3-0.7 units/ml aPTT 66-102 seconds Monitor platelets by anticoagulation protocol: Yes Vanc AUC 400-550    Plan:  Continue heparin to 1450 units/h Noted plan to OR ~2000 today Continue Vancomycin 1250 mg IV q 24h (eAUC 483, Goal AUC 400-550, SCr 0.9) Monitor renal function and levels at Css F/u TTE  Thank you for allowing pharmacy to be a part of this patients care.  Donnald Garre, PharmD Clinical Pharmacist  Please check AMION for all [place] numbers After 10:00 PM, call [place] [phone]

## 2021-04-24 NOTE — Progress Notes (Signed)
Echocardiogram 2D Echocardiogram has been performed.  Arlyss Gandy 04/24/2021, 10:34 AM

## 2021-04-24 NOTE — Anesthesia Procedure Notes (Signed)
Procedure Name: MAC Date/Time: 04/24/2021 10:03 PM Performed by: Reece Agar, CRNA Pre-anesthesia Checklist: Patient identified, Emergency Drugs available, Suction available and Patient being monitored Patient Re-evaluated:Patient Re-evaluated prior to induction Oxygen Delivery Method: Simple face mask

## 2021-04-25 ENCOUNTER — Encounter (HOSPITAL_COMMUNITY): Payer: Self-pay | Admitting: Sports Medicine

## 2021-04-25 ENCOUNTER — Ambulatory Visit: Payer: Medicare PPO | Admitting: Orthopedic Surgery

## 2021-04-25 DIAGNOSIS — B9562 Methicillin resistant Staphylococcus aureus infection as the cause of diseases classified elsewhere: Secondary | ICD-10-CM | POA: Diagnosis not present

## 2021-04-25 DIAGNOSIS — R7881 Bacteremia: Secondary | ICD-10-CM | POA: Diagnosis not present

## 2021-04-25 DIAGNOSIS — M659 Synovitis and tenosynovitis, unspecified: Secondary | ICD-10-CM

## 2021-04-25 LAB — CBC
HCT: 26.1 % — ABNORMAL LOW (ref 36.0–46.0)
Hemoglobin: 7.9 g/dL — ABNORMAL LOW (ref 12.0–15.0)
MCH: 26.6 pg (ref 26.0–34.0)
MCHC: 30.3 g/dL (ref 30.0–36.0)
MCV: 87.9 fL (ref 80.0–100.0)
Platelets: 643 10*3/uL — ABNORMAL HIGH (ref 150–400)
RBC: 2.97 MIL/uL — ABNORMAL LOW (ref 3.87–5.11)
RDW: 17.9 % — ABNORMAL HIGH (ref 11.5–15.5)
WBC: 9.1 10*3/uL (ref 4.0–10.5)
nRBC: 0 % (ref 0.0–0.2)

## 2021-04-25 LAB — HEPARIN LEVEL (UNFRACTIONATED): Heparin Unfractionated: 0.78 IU/mL — ABNORMAL HIGH (ref 0.30–0.70)

## 2021-04-25 LAB — CULTURE, BLOOD (ROUTINE X 2): Special Requests: ADEQUATE

## 2021-04-25 LAB — BASIC METABOLIC PANEL
Anion gap: 9 (ref 5–15)
BUN: 11 mg/dL (ref 8–23)
CO2: 22 mmol/L (ref 22–32)
Calcium: 8.1 mg/dL — ABNORMAL LOW (ref 8.9–10.3)
Chloride: 105 mmol/L (ref 98–111)
Creatinine, Ser: 0.78 mg/dL (ref 0.44–1.00)
GFR, Estimated: >60 mL/min (ref >60)
Glucose, Bld: 187 mg/dL — ABNORMAL HIGH (ref 70–99)
Potassium: 3.7 mmol/L (ref 3.5–5.1)
Sodium: 136 mmol/L (ref 135–145)

## 2021-04-25 LAB — GLUCOSE, CAPILLARY
Glucose-Capillary: 126 mg/dL — ABNORMAL HIGH (ref 70–99)
Glucose-Capillary: 140 mg/dL — ABNORMAL HIGH (ref 70–99)
Glucose-Capillary: 161 mg/dL — ABNORMAL HIGH (ref 70–99)
Glucose-Capillary: 170 mg/dL — ABNORMAL HIGH (ref 70–99)
Glucose-Capillary: 230 mg/dL — ABNORMAL HIGH (ref 70–99)

## 2021-04-25 LAB — APTT: aPTT: 77 seconds — ABNORMAL HIGH (ref 24–36)

## 2021-04-25 MED ORDER — METHOCARBAMOL 500 MG PO TABS
500.0000 mg | ORAL_TABLET | Freq: Once | ORAL | Status: AC
  Administered 2021-04-25: 500 mg via ORAL
  Filled 2021-04-25: qty 1

## 2021-04-25 MED ORDER — MUPIROCIN 2 % EX OINT
1.0000 "application " | TOPICAL_OINTMENT | Freq: Two times a day (BID) | CUTANEOUS | Status: DC
  Administered 2021-04-25 – 2021-04-27 (×5): 1 via NASAL
  Filled 2021-04-25: qty 22

## 2021-04-25 MED ORDER — ADULT MULTIVITAMIN W/MINERALS CH
1.0000 | ORAL_TABLET | Freq: Every day | ORAL | Status: DC
  Administered 2021-04-25 – 2021-04-27 (×3): 1 via ORAL
  Filled 2021-04-25 (×3): qty 1

## 2021-04-25 MED ORDER — CHLORHEXIDINE GLUCONATE CLOTH 2 % EX PADS
6.0000 | MEDICATED_PAD | Freq: Every day | CUTANEOUS | Status: DC
  Administered 2021-04-25 – 2021-04-27 (×3): 6 via TOPICAL

## 2021-04-25 MED ORDER — POLYETHYLENE GLYCOL 3350 17 G PO PACK
17.0000 g | PACK | Freq: Once | ORAL | Status: AC | PRN
  Administered 2021-04-25: 17 g via ORAL
  Filled 2021-04-25: qty 1

## 2021-04-25 MED ORDER — ENSURE ENLIVE PO LIQD
237.0000 mL | Freq: Two times a day (BID) | ORAL | Status: DC
  Administered 2021-04-27: 237 mL via ORAL

## 2021-04-25 NOTE — Progress Notes (Signed)
Received patient from PACU, assessment completed, VS documented.  Will continue to monitor.

## 2021-04-25 NOTE — Progress Notes (Signed)
Initial Nutrition Assessment  DOCUMENTATION CODES:   Obesity unspecified, Severe malnutrition in context of acute illness/injury  INTERVENTION:  - Encourage PO intake - Ensure Enlive po BID, each supplement provides 350 kcal and 20 grams of protein. (Chocolate or vanilla) - MVI with minerals daily  NUTRITION DIAGNOSIS:   Severe Malnutrition related to acute illness (DM foot infection/cellulitis) as evidenced by energy intake < or equal to 50% for > or equal to 5 days, percent weight loss.  GOAL:   Patient will meet greater than or equal to 90% of their needs  MONITOR:   PO intake, Supplement acceptance, Labs, Weight trends  REASON FOR ASSESSMENT:   Consult Assessment of nutrition requirement/status  ASSESSMENT:   Pt sent from podiatry clinic with L foot pain and swelling secondary to diabetic food infection/cellulitis of L foot. PMH includes HLD, CAD s/p PCI, T2DM, PAD, panhypopituitarism, and osteomyelitis s/p amputation of L fifth toe.  2/1: s/p  L ankle I&D with debridement of infected soft tissue and bone biopsy.   Noted plans for TEE tomorrow.  Pt reports poor PO intake and appetite for the last 2-3 weeks which she attributes to her L leg pain and infection. She has had associated nausea and has been unable to tolerate smells of food or eat even bites of food. Prior to these past few weeks, she was able to eat 2 meals per day with no changes in appetite. This morning she was able to tolerate 2 boiled eggs, bacon and grits and just ordered lunch consisting of a cheese burger with mayo and tomato, chicken soup and a mango ice. Discussed with pt the importance of adequate protein and nutrition for wound healing. Pt reports an intolerance to Glucerna but given multiple weeks with inadequate PO, pt is agreeable to try Ensure.  Pt reports a usual weight of 212-220 lbs and denies recent weight loss. Per review of chart, pt's weights remained stable until January. At her  appointment on 1/9 pt's weight noted to be 103.4 kg. Her current weight is 97.7 kg. This is a 6% weight loss within the past 4 weeks which is significant for time frame.  Pt meets criteria for acute malnutrition given stable weight history prior to 6% weight loss within the last 1 month and inadequate PO intake for 2-3 weeks.   Medications: decadron, SSI, semglee 23 units daily, protonix, IV abx  Labs: HgbA1c 7.2%  NUTRITION - FOCUSED PHYSICAL EXAM: Pt reports that she was previously using a cane for mobility but more recently has been using a walker for mobility.  Flowsheet Row Most Recent Value  Orbital Region No depletion  Upper Arm Region No depletion  Thoracic and Lumbar Region No depletion  Buccal Region No depletion  Temple Region Mild depletion  Clavicle Bone Region No depletion  Clavicle and Acromion Bone Region No depletion  Scapular Bone Region No depletion  Dorsal Hand Mild depletion  Patellar Region Moderate depletion  Anterior Thigh Region Moderate depletion  Posterior Calf Region Severe depletion  [L calf wrapped for wound care]  Edema (RD Assessment) None  Hair Reviewed  Eyes Reviewed  Mouth Reviewed  Skin Reviewed  Nails Reviewed       Diet Order:   Diet Order             Diet Carb Modified Fluid consistency: Thin; Room service appropriate? Yes  Diet effective now                   EDUCATION  NEEDS:   Education needs have been addressed  Skin:  Skin Assessment: Skin Integrity Issues: Skin Integrity Issues:: Incisions, Other (Comment) Incisions: L leg (closed) Other: L leg/ankle wound (open)  Last BM:  PTA  Height:   Ht Readings from Last 1 Encounters:  04/24/21 5\' 7"  (1.702 m)    Weight:   Wt Readings from Last 1 Encounters:  04/24/21 97.7 kg    Ideal Body Weight:  61.4 kg  BMI:  Body mass index is 33.73 kg/m.  Estimated Nutritional Needs:   Kcal:  1700-1900  Protein:  90-105g  Fluid:  >/=1.7L  Clayborne Dana, RDN,  LDN Clinical Nutrition

## 2021-04-25 NOTE — Progress Notes (Signed)
Orthopedic Tech Progress Note Patient Details:  Tammy Good 1951-02-14 [id number]  Ortho Devices Type of Ortho Device: Postop shoe/boot Ortho Device/Splint Interventions: Kennon Holter Simran Bomkamp 04/25/2021, 1:26 PM Delivered post op shoe.

## 2021-04-25 NOTE — Anesthesia Postprocedure Evaluation (Signed)
Anesthesia Post Note  Patient: Tammy Good  Procedure(s) Performed: INCISION AND DRAINAGE (Left: Leg Lower) BONE BIOPSY (Left: Leg Lower)     Patient location during evaluation: PACU Anesthesia Type: MAC Level of consciousness: awake and alert Pain management: pain level controlled Vital Signs Assessment: post-procedure vital signs reviewed and stable Respiratory status: spontaneous breathing Cardiovascular status: stable Anesthetic complications: no   No notable events documented.  Last Vitals:  Vitals:   04/24/21 2335 04/24/21 2350  BP: (!) 123/49 (!) 124/43  Pulse: 79 79  Resp: (!) 21 (!) 21  Temp:    SpO2: 98% 98%    Last Pain:  Vitals:   04/24/21 2350  TempSrc:   PainSc: [place]

## 2021-04-25 NOTE — Evaluation (Signed)
Physical Therapy Evaluation  Patient Details Name: Tammy Good MRN: [id number] DOB: 06-19-1950 Today's Date: 04/25/2021  History of Present Illness  Pt is a 71 y/o female who presents to acute PT s/p I&D of the L foot and bone biopsy (WBAT in post-op shoe). 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   Clinical Impression  Pt admitted with above diagnosis. Pt currently with functional limitations due to the deficits listed below (see PT Problem List). At the time of PT eval pt was able to perform transfers and ambulation with gross min guard assist to occasional min assist and RW for support. She hopes to be able to d/c home with HHPT services to follow up and intermittent family support. Acutely, pt will benefit from skilled PT to increase their independence and safety with mobility to allow discharge to the venue listed below.          Recommendations for follow up therapy are one component of a multi-disciplinary discharge planning process, led by the attending physician.  Recommendations may be updated based on patient status, additional functional criteria and insurance authorization.  Follow Up Recommendations Home health PT    Assistance Recommended at Discharge Intermittent Supervision/Assistance  Patient can return home with the following  A little help with walking and/or transfers;Assist for transportation;Help with stairs or ramp for entrance    Equipment Recommendations None recommended by PT  Recommendations for Other Services       Functional Status Assessment Patient has had a recent decline in their functional status and demonstrates the ability to make significant improvements in function in a reasonable and predictable amount of time.     Precautions / Restrictions Precautions Precautions: Fall Restrictions Weight Bearing Restrictions: Yes LLE Weight Bearing: Weight bearing as  tolerated Other Position/Activity Restrictions: In post-op shoe      Mobility  Bed Mobility Overal bed mobility: Needs Assistance Bed Mobility: Supine to Sit, Sit to Supine     Supine to sit: Min guard Sit to supine: Min guard   General bed mobility comments: Close guard for management of lines and LLE.    Transfers Overall transfer level: Needs assistance Equipment used: Rolling walker (2 wheels) Transfers: Sit to/from Stand Sit to Stand: Min guard           General transfer comment: Hands on guarding for power up to full stand. VC's for hand placement on seated surface for safety. Additional cues for pt to back up fully to toilet befor initiating stand>sit.    Ambulation/Gait Ambulation/Gait assistance: Min assist Gait Distance (Feet): 30 Feet Assistive device: Rolling walker (2 wheels) Gait Pattern/deviations: Step-through pattern, Decreased stride length, Decreased weight shift to left, Decreased dorsiflexion - left Gait velocity: Decreased Gait velocity interpretation: 1.31 - 2.62 ft/sec, indicative of limited community ambulator   General Gait Details: Slow and guarded but fairly steady with RW for support. Pt ambulated around room - to and from bathroom to void as well. Occasional assist provided for balance as well as cues throughout to avoid IV pole/lines.  Stairs            Wheelchair Mobility    Modified Rankin (Stroke Patients Only)       Balance Overall balance assessment: Needs assistance Sitting-balance support: Feet supported, No upper extremity supported Sitting balance-Leahy Scale: Fair     Standing balance support: No upper extremity supported, During functional activity Standing balance-Leahy Scale: Fair Standing balance comment: Pt stood  to perform peri-care in bathroom without LOB.                             Pertinent Vitals/Pain Pain Assessment Pain Assessment: Faces Faces Pain Scale: Hurts little more Pain  Location: L foot Pain Descriptors / Indicators: Operative site guarding, Sore Pain Intervention(s): Limited activity within patient's tolerance, Monitored during session, Repositioned    Home Living Family/patient expects to be discharged to:: Private residence Living Arrangements: Alone Available Help at Discharge: Family;Available PRN/intermittently Type of Home: House Home Access: Stairs to enter Entrance Stairs-Rails: Left Entrance Stairs-Number of Steps: 3-4 at the front, 4 through the garage (typical entrance)   Home Layout: One level Home Equipment: Shower seat;Cane - single point;Rolling Walker (2 wheels);Hand held shower head      Prior Function Prior Level of Function : Independent/Modified Independent;Driving             Mobility Comments: Using the [facility] ADLs Comments: Sits to do some grooming.     Hand Dominance   Dominant Hand: Right    Extremity/Trunk Assessment   Upper Extremity Assessment Upper Extremity Assessment: Defer to OT evaluation    Lower Extremity Assessment Lower Extremity Assessment: LLE deficits/detail LLE Deficits / Details: Decreased strength (mild) and AROM consistent with above mentioned diagnosis and procedure.    Cervical / Trunk Assessment Cervical / Trunk Assessment: Normal (Mild forward head posture with rounded shoulders)  Communication   Communication: HOH (R ear)  Cognition Arousal/Alertness: Awake/alert Behavior During Therapy: WFL for tasks assessed/performed Overall Cognitive Status: Within Functional Limits for tasks assessed                                          General Comments      Exercises     Assessment/Plan    PT Assessment Patient needs continued PT services  PT Problem List Decreased strength;Decreased activity tolerance;Decreased range of motion;Decreased balance;Decreased mobility;Decreased knowledge of use of DME;Decreased safety awareness;Decreased knowledge of  precautions;Pain       PT Treatment Interventions DME instruction;Gait training;Stair training;Functional mobility training;Therapeutic activities;Therapeutic exercise;Patient/family education;Balance training    PT Goals (Current goals can be found in the Care Plan section)  Acute Rehab PT Goals Patient Stated Goal: Return home with family support PT Goal Formulation: With patient Time For Goal Achievement: 05/02/21 Potential to Achieve Goals: Good    Frequency Min 3X/week     Co-evaluation               AM-PAC PT "6 Clicks" Mobility  Outcome Measure Help needed turning from your back to your side while in a flat bed without using bedrails?: None Help needed moving from lying on your back to sitting on the side of a flat bed without using bedrails?: A Little Help needed moving to and from a bed to a chair (including a wheelchair)?: A Little Help needed standing up from a chair using your arms (e.g., wheelchair or bedside chair)?: A Little Help needed to walk in hospital room?: A Little Help needed climbing 3-5 steps with a railing? : A Little 6 Click Score: 19    End of Session Equipment Utilized During Treatment: Gait belt;Other (comment) (Post-op shoe) Activity Tolerance: Patient tolerated treatment well Patient left: in bed;with call bell/phone within reach;with bed alarm set Nurse Communication: Mobility status PT Visit Diagnosis: Unsteadiness on  feet (R26.81);Pain Pain - Right/Left: Left Pain - part of body: Ankle and joints of foot    Time: 1511-1535 PT Time Calculation (min) (ACUTE ONLY): 24 min   Charges:   PT Evaluation $PT Eval Moderate Complexity: 1 Mod PT Treatments $Gait Training: 8-22 mins        Rolinda Roan, PT, DPT Acute Rehabilitation Services Pager: [phone] Office: [phone]   Thelma Comp 04/25/2021, 6:06 PM

## 2021-04-25 NOTE — Progress Notes (Signed)
° °  [place] has been requested to perform a transesophageal echocardiogram on Tammy Good for bacteremia.  After careful review of history and examination, the risks and benefits of transesophageal echocardiogram have been explained including risks of esophageal damage, perforation (1:10,000 risk), bleeding, pharyngeal hematoma as well as other potential complications associated with conscious sedation including aspiration, arrhythmia, respiratory failure and death. Alternatives to treatment were discussed, questions were answered. Patient is willing to proceed.   Procedure is scheduled for 04/26/2021 at 11:00am with Dr. Harrell Gave. Will place orders.  Of note, hemoglobin has trended down this admission. Hemoglobin was 10.9 on admission and has been 7.9 the last 2 days. Patient already has repeat CBC ordered for tomorrow so we can make sure hemoglobin is stable.  Darreld Mclean, PA-C 04/25/2021 4:23 PM

## 2021-04-25 NOTE — Progress Notes (Signed)
ANTICOAGULATION CONSULT NOTE  Pharmacy Consult for IV heparin Indication: atrial fibrillation  Allergies  Allergen Reactions   Strawberry Extract Itching, Swelling and Anaphylaxis    Mouth swells and gets itchy    Patient Measurements: Height: 5\' 7"  (170.2 cm) Weight: 97.7 kg (215 lb 6.2 oz) IBW/kg (Calculated) : 61.6 Heparin Dosing Weight: 83.9 kg  Vital Signs: Temp: 97.9 F (36.6 C) (02/02 0801) Temp Source: Oral (02/02 0801) BP: 123/47 (02/02 0801) Pulse Rate: 75 (02/02 0801)  Labs: Recent Labs    04/22/21 1739 04/23/21 1241 04/23/21 1453 04/23/21 1530 04/23/21 2154 04/24/21 0449 04/24/21 1519 04/25/21 0220 04/25/21 0845  HGB 9.5*   < >  --  11.4*  --  7.9*  --  7.9*  --   HCT 30.9*   < >  --  37.0  --  25.6*  --  26.1*  --   PLT 831*  --   --   --   --  630*  --  643*  --   APTT  --   --  43*  --    < > 61* 77*  --  77*  HEPARINUNFRC  --   --  >1.10*  --   --  >1.10*  --   --  0.78*  CREATININE 1.13*  --   --   --   --  0.92  --  0.78  --    < > = values in this interval not displayed.     Estimated Creatinine Clearance: 80.8 mL/min (by C-G formula based on SCr of 0.78 mg/dL).   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   Hyperlipidemia with target LDL less than 70    Hypothyroidism (acquired)    Mild essential hypertension    Obesity (BMI 30-39.9) 11/17/2013   PAD (peripheral artery disease) ([place]) 05/26/2019   05/26/19: Abd AoGram- BLE runoff -> L SFA orbital atherectomy - PTA w/ DEB & Stent 6 x 40 Luttonix (for focal dissection) -  patent Pop A with 3 V runoff. LEA Dopplers 01/03/2020: RABI (prev) 0.68 (0.53)/ RTBI (prev) 0.58 (0.33); LABI (prev) 0.80 (0.64), LTBI (prev) 0.64 (0.51); R mSFA ~50-74%, L mSFA 50-74%. Patent Prox SFA stent < 49% stenosis   Panhypopituitarism (HCC)    ST elevation myocardial infarction (STEMI) of inferior wall, subsequent episode of care ([place]) 08/2013   80% branch of D1, 40% mid AV groove circumflex, 100% RCA with subacute thrombus -- thrombus extending into RPA V with 100% occlusion after initial angioplasty of mid RCA ;; Post MI ECHO 6/9/'15: EF 50-55%, mild LVH with moderate HK of inferior wall, Gr1 DD, mild LA dilation; mildly reduced RV function   Assessment: 5 YOF presenting with foot infection.  PMH significant for A-fib on apixaban. Last dose taken 1/30 @1000 . Pharmacy to transition to IV heparin.  Heparin was turned off 2/1 @ 1943 for procedure. Now post-op day #1 Left ankle I & D w/debridement. Heparin restarted 2/2 @ 0051. No  issues with infusion or bleeding noted. CBC stable this morning. aPTT is therapeutic at 77 seconds on 1450 units/hr. Heparin level and aPTT not yet correlating, will continue to use aPTT for monitoring.   Goal of Therapy:  Heparin level 0.3-0.7 units/ml aPTT 66-102 seconds Monitor platelets by anticoagulation protocol: Yes   Plan:  Continue heparin to 1450 units/hr Check heparin level/aPTT daily while on heparin Continue to monitor H&H and platelets F/u restart apixaban   Thank you for allowing [place] to participate in this patients care. Jens Som, PharmD 04/25/2021 9:57 AM  **Pharmacist phone directory can be found on [email or web address] listed under [place]**

## 2021-04-25 NOTE — Progress Notes (Signed)
[facility] for Infectious Disease  Date of Admission:  04/22/2021      Total days of antibiotics 3  Vancomycin 1/31 >>            ASSESSMENT: Tammy Good is a 71 y.o. female with cellulitis, septic tendonitis with abscess in the left ankle/foot complicated by MRSA bacteremia. TTE was not good quality and initial blood cultures were only from 1 site. Called Cardiology to check TEE and d/w [name] today - this will help with treatment plan for IV antibiotics. Follow up pathology of fibula bone and cultures from surgery (growing gram positive cocci in clusters, and likely matches her MRSA in the blood). Repeat blood cultures pending.  Please hold on PICC Line for now until we ensure her bacteremia has cleared.  No other signs of metastatic infection at this point to be concerned about.  Continue on vancomycin. Follow kidney function and levels for adjustments.   She lives alone - will need to gauge how much support she has in the community to help with IV antibiotics and see how she does mobility wise from her surgery. Will discuss more tomorrow.    PLAN: Cardiology consulted for TEE today - planning for tomorrow  Hold on PICC line for now - maybe placement Saturday if repeat bcx remain clear Continue vancomycin IV    Principal Problem:   MRSA bacteremia Active Problems:   Panhypopituitarism ([place])   Hyperlipidemia associated with type 2 diabetes mellitus ([place])   CAD S/P percutaneous coronary angioplasty   Essential hypertension   Obesity (BMI 30-39.9)   PAD (peripheral artery disease) (HCC)   Cellulitis   Thrombocytosis   Sepsis (HCC)   Tenosynovitis of left ankle    atorvastatin  40 mg Oral q1800   Chlorhexidine Gluconate Cloth  6 each Topical Q0600   dexamethasone  0.5 mg Oral q morning   insulin aspart  0-15 Units Subcutaneous TID WC   insulin glargine-yfgn  23 Units Subcutaneous QHS   levothyroxine  125 mcg Oral QAC breakfast   losartan  25  mg Oral q morning   metoprolol succinate  25 mg Oral BID   mupirocin ointment  1 application Nasal BID   mupirocin ointment  1 application Nasal BID   pantoprazole  40 mg Oral BID AC   sodium chloride flush  3 mL Intravenous Q12H   spironolactone  25 mg Oral q morning    SUBJECTIVE: She says she feels much better after having surgery on her ankle yesterday evening. Has not tried to get up yet. No fevers / chills. No new complaints.    Review of Systems: Review of Systems  Constitutional:  Negative for chills, fever, malaise/fatigue and weight loss.  HENT:  Negative for sore throat.        No dental problems  Respiratory:  Negative for cough and sputum production.   Cardiovascular:  Negative for chest pain and leg swelling.  Gastrointestinal:  Negative for abdominal pain, diarrhea and vomiting.  Genitourinary:  Negative for dysuria and flank pain.  Musculoskeletal:  Positive for joint pain (improved left ankle pain). Negative for myalgias and neck pain.  Skin:  Negative for rash.  Neurological:  Negative for dizziness, tingling and headaches.  Psychiatric/Behavioral:  Negative for depression and substance abuse. The patient is not nervous/anxious and does not have insomnia.     Allergies  Allergen Reactions   Strawberry Extract Itching, Swelling and Anaphylaxis  Mouth swells and gets itchy    OBJECTIVE: Vitals:   04/25/21 0005 04/25/21 0032 04/25/21 0453 04/25/21 0801  BP: (!) 127/58 (!) 138/50 (!) 110/50 (!) 123/47  Pulse: 86 78 66 75  Resp: 18 19 19 17   Temp: 97.9 F (36.6 C) 97.7 F (36.5 C) 97.9 F (36.6 C) 97.9 F (36.6 C)  TempSrc:    Oral  SpO2: 99% 100% 98% 98%  Weight:      Height:       Body mass index is 33.73 kg/m.  Physical Exam Vitals reviewed.  Constitutional:      Appearance: Normal appearance. She is not ill-appearing.  HENT:     Mouth/Throat:     Mouth: Mucous membranes are moist.     Pharynx: Oropharynx is clear.  Eyes:     General: No  scleral icterus. Cardiovascular:     Rate and Rhythm: Normal rate and regular rhythm.  Pulmonary:     Effort: Pulmonary effort is normal.  Musculoskeletal:     Comments: LLE wrapped  Skin:    General: Skin is warm and dry.  Neurological:     Mental Status: She is alert and oriented to person, place, and time.  Psychiatric:        Mood and Affect: Mood normal.        Thought Content: Thought content normal.     Lab Results Lab Results  Component Value Date   WBC 9.1 04/25/2021   HGB 7.9 (L) 04/25/2021   HCT 26.1 (L) 04/25/2021   MCV 87.9 04/25/2021   PLT 643 (H) 04/25/2021    Lab Results  Component Value Date   CREATININE 0.78 04/25/2021   BUN 11 04/25/2021   NA 136 04/25/2021   K 3.7 04/25/2021   CL 105 04/25/2021   CO2 22 04/25/2021    Lab Results  Component Value Date   ALT 13 04/22/2021   AST 15 04/22/2021   ALKPHOS 87 04/22/2021   BILITOT 0.6 04/22/2021     Microbiology: Recent Results (from the past 240 hour(s))  Blood culture (routine x 2)     Status: Abnormal   Collection Time: 04/22/21  5:33 PM   Specimen: BLOOD  Result Value Ref Range Status   Specimen Description BLOOD SITE NOT SPECIFIED  Final   Special Requests   Final    BOTTLES DRAWN AEROBIC AND ANAEROBIC Blood Culture adequate volume   Culture  Setup Time   Final    GRAM POSITIVE COCCI IN CLUSTERS IN BOTH AEROBIC AND ANAEROBIC BOTTLES CRITICAL RESULT CALLED TO, READ BACK BY AND VERIFIED WITH: [employer] F. [id number] FCP Performed at [facility], [place] [address] [place], [place] [address]    Culture METHICILLIN RESISTANT STAPHYLOCOCCUS AUREUS (A)  Final   Report Status 04/25/2021 FINAL  Final   Organism ID, Bacteria METHICILLIN RESISTANT STAPHYLOCOCCUS AUREUS  Final      Susceptibility   Methicillin resistant staphylococcus aureus - MIC*    CIPROFLOXACIN >=8 RESISTANT Resistant     ERYTHROMYCIN >=8 RESISTANT Resistant     GENTAMICIN <=0.5 SENSITIVE Sensitive     OXACILLIN >=4  RESISTANT Resistant     TETRACYCLINE <=1 SENSITIVE Sensitive     VANCOMYCIN 1 SENSITIVE Sensitive     TRIMETH/SULFA 160 RESISTANT Resistant     CLINDAMYCIN <=0.25 SENSITIVE Sensitive     RIFAMPIN <=0.5 SENSITIVE Sensitive     Inducible Clindamycin NEGATIVE Sensitive     * METHICILLIN RESISTANT STAPHYLOCOCCUS AUREUS  Blood  Culture ID Panel (Reflexed)     Status: Abnormal   Collection Time: 04/22/21  5:33 PM  Result Value Ref Range Status   Enterococcus faecalis NOT DETECTED NOT DETECTED Final   Enterococcus Faecium NOT DETECTED NOT DETECTED Final   Listeria monocytogenes NOT DETECTED NOT DETECTED Final   Staphylococcus species DETECTED (A) NOT DETECTED Final    Comment: CRITICAL RESULT CALLED TO, READ BACK BY AND VERIFIED WITH: PHARMD JEREMY F. [id number] FCP    Staphylococcus aureus (BCID) DETECTED (A) NOT DETECTED Final    Comment: Methicillin (oxacillin)-resistant Staphylococcus aureus (MRSA). MRSA is predictably resistant to beta-lactam antibiotics (except ceftaroline). Preferred therapy is vancomycin unless clinically contraindicated. Patient requires contact precautions if  hospitalized. CRITICAL RESULT CALLED TO, READ BACK BY AND VERIFIED WITH: PHARMD JEREMY F. [id number] FCP    Staphylococcus epidermidis NOT DETECTED NOT DETECTED Final   Staphylococcus lugdunensis NOT DETECTED NOT DETECTED Final   Streptococcus species NOT DETECTED NOT DETECTED Final   Streptococcus agalactiae NOT DETECTED NOT DETECTED Final   Streptococcus pneumoniae NOT DETECTED NOT DETECTED Final   Streptococcus pyogenes NOT DETECTED NOT DETECTED Final   A.calcoaceticus-baumannii NOT DETECTED NOT DETECTED Final   Bacteroides fragilis NOT DETECTED NOT DETECTED Final   Enterobacterales NOT DETECTED NOT DETECTED Final   Enterobacter cloacae complex NOT DETECTED NOT DETECTED Final   Escherichia coli NOT DETECTED NOT DETECTED Final   Klebsiella aerogenes NOT DETECTED NOT DETECTED Final   Klebsiella oxytoca  NOT DETECTED NOT DETECTED Final   Klebsiella pneumoniae NOT DETECTED NOT DETECTED Final   Proteus species NOT DETECTED NOT DETECTED Final   Salmonella species NOT DETECTED NOT DETECTED Final   Serratia marcescens NOT DETECTED NOT DETECTED Final   Haemophilus influenzae NOT DETECTED NOT DETECTED Final   Neisseria meningitidis NOT DETECTED NOT DETECTED Final   Pseudomonas aeruginosa NOT DETECTED NOT DETECTED Final   Stenotrophomonas maltophilia NOT DETECTED NOT DETECTED Final   Candida albicans NOT DETECTED NOT DETECTED Final   Candida auris NOT DETECTED NOT DETECTED Final   Candida glabrata NOT DETECTED NOT DETECTED Final   Candida krusei NOT DETECTED NOT DETECTED Final   Candida parapsilosis NOT DETECTED NOT DETECTED Final   Candida tropicalis NOT DETECTED NOT DETECTED Final   Cryptococcus neoformans/gattii NOT DETECTED NOT DETECTED Final   Meth resistant mecA/C and MREJ DETECTED (A) NOT DETECTED Final    Comment: CRITICAL RESULT CALLED TO, READ BACK BY AND VERIFIED WITH: PHARMD JEREMY F. [id number] FCP Performed at [facility], [address], [place], [place] [address]   Resp Panel by RT-PCR (Flu A&B, Covid) Nasopharyngeal Swab     Status: None   Collection Time: 04/23/21  9:08 AM   Specimen: Nasopharyngeal Swab; Nasopharyngeal(NP) swabs in vial transport medium  Result Value Ref Range Status   SARS Coronavirus 2 by RT PCR NEGATIVE NEGATIVE Final    Comment: (NOTE) SARS-CoV-2 target nucleic acids are NOT DETECTED.  The SARS-CoV-2 RNA is generally detectable in upper respiratory specimens during the acute phase of infection. The lowest concentration of SARS-CoV-2 viral copies this assay can detect is 138 copies/mL. A negative result does not preclude SARS-Cov-2 infection and should not be used as the sole basis for treatment or other patient management decisions. A negative result may occur with  improper specimen collection/handling, submission of specimen other than  nasopharyngeal swab, presence of viral mutation(s) within the areas targeted by this assay, and inadequate number of viral copies(<138 copies/mL). A negative result must be combined with  clinical observations, patient history, and epidemiological information. The expected result is Negative.  Fact Sheet for Patients:  EntrepreneurPulse.com.au  Fact Sheet for Healthcare Providers:  IncredibleEmployment.be  This test is no t yet approved or cleared by the [place] FDA and  has been authorized for detection and/or diagnosis of SARS-CoV-2 by FDA under an Emergency Use Authorization (EUA). This EUA will remain  in effect (meaning this test can be used) for the duration of the COVID-19 declaration under Section 564(b)(1) of the Act, 21 U.S.C.section 360bbb-3(b)(1), unless the authorization is terminated  or revoked sooner.       Influenza A by PCR NEGATIVE NEGATIVE Final   Influenza B by PCR NEGATIVE NEGATIVE Final    Comment: (NOTE) The Xpert Xpress SARS-CoV-2/FLU/RSV plus assay is intended as an aid in the diagnosis of influenza from Nasopharyngeal swab specimens and should not be used as a sole basis for treatment. Nasal washings and aspirates are unacceptable for Xpert Xpress SARS-CoV-2/FLU/RSV testing.  Fact Sheet for Patients: EntrepreneurPulse.com.au  Fact Sheet for Healthcare Providers: IncredibleEmployment.be  This test is not yet approved or cleared by the [place] FDA and has been authorized for detection and/or diagnosis of SARS-CoV-2 by FDA under an Emergency Use Authorization (EUA). This EUA will remain in effect (meaning this test can be used) for the duration of the COVID-19 declaration under Section 564(b)(1) of the Act, 21 U.S.C. section 360bbb-3(b)(1), unless the authorization is terminated or revoked.  Performed at [facility], [place] [address] [place], [place] [address]    Culture, blood (routine x 2)     Status: None (Preliminary result)   Collection Time: 04/24/21  3:18 PM   Specimen: BLOOD  Result Value Ref Range Status   Specimen Description BLOOD RIGHT ANTECUBITAL  Final   Special Requests   Final    BOTTLES DRAWN AEROBIC AND ANAEROBIC Blood Culture adequate volume   Culture   Final    NO GROWTH < 24 HOURS Performed at [facility], [place] [address], [place], [place] [address]    Report Status PENDING  Incomplete  Culture, blood (routine x 2)     Status: None (Preliminary result)   Collection Time: 04/24/21  3:37 PM   Specimen: BLOOD  Result Value Ref Range Status   Specimen Description BLOOD RIGHT ANTECUBITAL  Final   Special Requests   Final    BOTTLES DRAWN AEROBIC AND ANAEROBIC Blood Culture results may not be optimal due to an inadequate volume of blood received in culture bottles   Culture   Final    NO GROWTH < 24 HOURS Performed at [facility], [place] [address] [place], [place] [address]    Report Status PENDING  Incomplete  Surgical PCR screen     Status: Abnormal   Collection Time: 04/24/21  3:51 PM   Specimen: Nasal Mucosa; Nasal Swab  Result Value Ref Range Status   MRSA, PCR POSITIVE (A) NEGATIVE Final    Comment: RESULT CALLED TO, READ BACK BY AND VERIFIED WITH: RN M. MARSH [phone] @2010  FH    Staphylococcus aureus POSITIVE (A) NEGATIVE Final    Comment: (NOTE) The Xpert SA Assay (FDA approved for NASAL specimens in patients 59 years of age and older), is one component of a comprehensive surveillance program. It is not intended to diagnose infection nor to guide or monitor treatment. Performed at [facility], [place] [address] [place], [place] [address]   Aerobic/Anaerobic Culture w Gram Stain (surgical/deep  wound)     Status: None (Preliminary result)   Collection Time: 04/24/21 10:32 PM   Specimen: Wound; Abscess  Result Value Ref Range Status   Specimen Description WOUND  Final   Special Requests LEFT  ANKLE  Final   Gram Stain   Final    MODERATE WBC PRESENT, PREDOMINANTLY PMN FEW GRAM POSITIVE COCCI IN CLUSTERS    Culture   Final    TOO YOUNG TO READ Performed at [facility], [place] [address] [place], [place] [address]    Report Status PENDING  Incomplete    Janene Madeira, MSN, NP-C [facility] [place] Pager: [phone]  04/25/2021  11:34 AM

## 2021-04-25 NOTE — Progress Notes (Signed)
Subjective: Tammy Good is a 71 y.o. female patient seen at bedside, resting comfortably in no acute distress s/p day #1 Left ankle incision and drainage with debridement at peroneal tendons and biopsy of fibula. Patient denies current pain at surgical site but states he did have an episode of some pain last night but better now, No other issues noted.   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   PAD (peripheral artery disease) ([place]) 05/26/2019   Osteomyelitis of left foot ([place]) 05/24/2019   Osteomyelitis ([place]) 05/23/2019   Cellulitis and abscess of foot, except toes 05/18/2019   Chronic osteomyelitis of ankle and foot ([place]) 05/18/2019   Cholesteatoma 02/24/2019   Cellulitis and abscess of toe 11/09/2018   Colon cancer screening 11/30/2017   Long term (current) use of insulin ([place]) 03/05/2017   Iron deficiency anemia 12/10/2014   Obesity (BMI 30-39.9) 11/17/2013   Diabetes mellitus type 2 in obese [facility])    Essential hypertension    Right thigh pain 11/01/2013   PVC's (premature ventricular contractions) 11/01/2013   Status post insertion of drug eluting coronary artery stent to [facility] emergently and PTCA to prox. PLA 09/16/2013   Hyperlipidemia associated with type 2 diabetes mellitus ([place]) 09/16/2013   Presence of coronary angioplasty implant and graft 09/08/2013   Panhypopituitarism ([place]) 08/30/2013   Non-ST elevation (NSTEMI) myocardial infarction ([place]) 08/22/2013   CAD S/P percutaneous coronary angioplasty 08/22/2013      Current Facility-Administered Medications:    atorvastatin (LIPITOR) tablet 40 mg, 40 mg, Oral, q1800, Smith, Rondell A, MD, 40 mg at 04/23/21 1758   dexamethasone (DECADRON) tablet 0.5 mg, 0.5 mg, Oral, q morning, Smith, Rondell A, MD, 0.5 mg at 04/24/21 0923   heparin ADULT infusion 100 units/mL (25000 units/258mL), 1,450 Units/hr, Intravenous, Continuous, Franky Macho, RPH, Last Rate: 14.5 mL/hr at 04/25/21 0051, 1,450 Units/hr at 04/25/21 0051   HYDROcodone-acetaminophen (NORCO/VICODIN) 5-325 MG per tablet 1 tablet, 1 tablet, Oral, Q6H PRN, [place] Plan A, MD, 1 tablet at 04/25/21 0330   insulin aspart (novoLOG) injection 0-15 Units, 0-15 Units, Subcutaneous, TID WC, Smith, Rondell A, MD, 2 Units at 04/24/21 0903   insulin glargine-yfgn (SEMGLEE) injection 23 Units, 23 Units, Subcutaneous, QHS, Smith, Rondell A, MD, 23 Units at 04/23/21 2141   levothyroxine (SYNTHROID) tablet 125 mcg, 125 mcg, Oral, QAC breakfast, [place] Plan A, MD, 125 mcg at 04/24/21 8242   losartan (COZAAR) tablet 25 mg, 25 mg, Oral, q morning, Smith, Rondell A, MD, 25 mg at 04/24/21 3536   metoprolol succinate (TOPROL-XL) 24 hr tablet 25 mg, 25 mg, Oral, BID, Smith, Rondell A, MD, 25 mg at 04/24/21 0902   mupirocin ointment (BACTROBAN) 2 % 1 application, 1 application, Nasal, BID, Hoffman, Erik C, DO   pantoprazole (PROTONIX) EC tablet 40 mg, 40 mg, Oral, BID AC, Smith, Rondell A, MD, 40 mg at 04/24/21 0902   polyethylene glycol (MIRALAX / GLYCOLAX) packet 17 g, 17 g, Oral, Once PRN, Kathryne Eriksson, NP   sodium chloride flush (NS) 0.9 % injection 3 mL,  3 mL, Intravenous, Q12H, Smith, Rondell A, MD, 3 mL at 04/24/21 9794   spironolactone (ALDACTONE) tablet 25 mg, 25 mg, Oral, q morning, Smith, Rondell A, MD, 25 mg at 04/24/21 0902   vancomycin (VANCOREADY) IVPB 1250 mg/250 mL, 1,250 mg, Intravenous, Q24H, Franky Macho, RPH, Last Rate: 166.7 mL/hr at 04/24/21 1421, Restarted at 04/24/21 1421  Allergies   Allergen Reactions   Strawberry Extract Itching, Swelling and Anaphylaxis    Mouth swells and gets itchy     Objective: Today's Vitals   04/25/21 0032 04/25/21 0330 04/25/21 0415 04/25/21 0453  BP: (!) 138/50   (!) 110/50  Pulse: 78   66  Resp: 19   19  Temp: 97.7 F (36.5 C)   97.9 F (36.6 C)  TempSrc:      SpO2: 100%   98%  Weight:      Height:      PainSc: 0-No pain 5  3      General: No acute distress  Left Lower extremity: Dressing to left foot clean, dry, intact. No strikethrough noted, No calf pain. Range of motion excluding surgical site within normal limits with no pain or crepitation.     Assessment and Plan:  Problem List Items Addressed This Visit       Other   * (Principal) Cellulitis   Other Visit Diagnoses     Left foot infection    -  Primary   Relevant Medications   vancomycin (VANCOREADY) IVPB 1500 mg/300 mL (Completed)   vancomycin (VANCOREADY) IVPB 1250 mg/250 mL   mupirocin ointment (BACTROBAN) 2 % 1 application       -Patient seen and evaluated at bedside -Chart reviewed -Orders placed for nursing to assist with dressing changes applying packing and dry dressing to the left foot and ankle once daily -Awaiting intra-op cultures; continue with IV antibiotics until cultures have resulted; recommend infectious disease -Weightbearing as tolerated with post op shoe; orders for physical therapy are in place -Continue with rest and elevation to assist with pain and edema control  -Podiatry will continue to follow closely  -Anticipated discharge plan of care: To home in 1 to 2 days with antibiotics based on culture results with home nursing of packing and dry dressing to the left ankle every other day.  After discharge patient to follow up in office within 1-2 weeks.  Office will call patient to arrange appointment.  Dr. Landis Martins On Call provider  [facility] [phone] office [phone] cell Available via secure  chat

## 2021-04-25 NOTE — Progress Notes (Signed)
PROGRESS NOTE    HELEM Good  PNT:[id number] DOB: 07/24/1950 DOA: 04/22/2021 PCP: [name], MD   Brief Narrative:  Tammy Good is a 71 y.o. female with medical history significant of hyperlipidemia, CAD s/p PCI, DM type II, PAD, panhypopituitarism, and osteomyelitis s/p amputation of the left fifth toe who presented for left foot pain and swelling.  She had been initially seen in Dr. Jess Barters office on 1/9 for left foot and leg pain after falling a couple weeks prior.  At that time she states x-rays were done of her leg and she was prescribed compression socks which she wore approximately 2 days, but thereafter noticed a blister form on the lateral aspect of her foot.  She called his office and was told to use silver sulfadiazine cream on the wound and to keep it wrapped.  However, since that time she reports that she has had worsening pain and swelling in the foot with redness present.  Bearing weight on the affected leg worsens pain.  She had a procedure done with [name] of vascular surgery on 1/26.  Following the procedure and there was report of some episodes of nausea and vomiting, but patient reports that has since resolved.  She then followed up with orthopedics in their office on 1/27.  She kept thinking that her foot was infected and told her providers doing the same appointments, but had not been placed on antibiotics for treatment. She followed up with Dr. Earleen Newport at the podiatry clinic  due to her concerns of infection foot, and had been sent to the ED for further evaluation.   Upon arrival to ED, she was hemodynamically stable, x-rays noted diffuse soft tissue swelling of the left foot without radiographic evidence of osteomyelitis.  Dr. Cannon Kettle of podiatry consulted.  Patient was given 1 L normal saline IV fluids, vancomycin, and Zosyn.  MRI of the foot was ordered which was suspicion for osteomyelitis.  Patient then developed MRSA bacteremia.  ID was consulted.     Assessment & Plan:   Principal Problem:   MRSA bacteremia Active Problems:   Panhypopituitarism ([place])   Hyperlipidemia associated with type 2 diabetes mellitus (HCC)   CAD S/P percutaneous coronary angioplasty   Essential hypertension   Obesity (BMI 30-39.9)   PAD (peripheral artery disease) (HCC)   Cellulitis   Thrombocytosis   Sepsis (HCC)   Tenosynovitis of left ankle   Sepsis secondary to diabetic foot infection/cellulitis of the left foot/tenosynovitis/possible early osteomyelitis of distal fibula/MRSA bacteremia: Patient meets sepsis criteria based on tachypnea and leukocytosis.  Patient presents for a nonhealing wound and pain on the lateral aspect of the left foot since the beginning of this month.  Patient had initially been started on empiric antibiotics of vancomycin, metronidazole, and cefepime however blood culture showed MRSA, she was transitioned to vancomycin per ID recommendations.  ID formally consulted.MRI of the left foot and ankle-moderate tenosynovitis appreciated with signs of early osteomyelitis.  Seen by podiatry.  Underwent Left ankle incision and drainage with debridement at peroneal tendons and biopsy of fibula.  Transthoracic echo could not rule out vegetation.  45% ejection fraction.  ID has reached out to cardiology and patient is scheduled for TEE tomorrow.  Appreciate ID and podiatry and will defer further management to them.  Paroxysmal  atrial fibrillation on chronic anticoagulation: Patient appears to be in sinus rhythm at this time. Heparin per pharmacy in case of possible need of procedure.  Continue Toprol-XL.   Normocytic anemia: Hemoglobin  is stable.   Peripheral vascular disease  CAD -Held Plavix due to possible need of procedure and or bleeding -Continue statin   Diabetes mellitus type 2:   Home regimen includes glargine 46 units nightly with NovoLog 3-8 units before every meal with meals.  Started on long-acting insulin 23 units since she had  to be n.p.o. however her blood sugar has remained stable so I will continue this and continue SSI and adjust insulin as needed.   Panhypopituitarism  -Continue levothyroxine and Decadron  Essential hypertension: Home blood pressure regimen includes losartan 25 mg every morning, metoprolol succinate 25 mg twice daily, and spironolactone 25 mg daily.  Blood pressure controlled on home regimen.  Thrombocytosis: Acute.  Platelet count elevated at 831.  Suspect secondary to above.  Improving.  Monitor   Hyperlipidemia: Associated with diabetes. -Continue atorvastatin   Hypoalbuminemia: Albumin noted to be 2.2. -Check prealbumin   GERD  -Continue Protonix   Obesity: BMI 34.61 kg/m2  DVT prophylaxis:    Code Status: Full Code  Family Communication:  None present at bedside.  Plan of care discussed with patient in length and he/she verbalized understanding and agreed with it.  Status is: Inpatient Remains inpatient appropriate because: Needs surgical intervention for diabetic foot/osteomyelitis  Planned Discharge Destination: Home with Home Health  Estimated body mass index is 33.73 kg/m as calculated from the following:   Height as of this encounter: 5\' 7"  (1.702 m).   Weight as of this encounter: 97.7 kg.    Nutritional Assessment: Body mass index is 33.73 kg/m.[name] Seen by dietician.  I agree with the assessment and plan as outlined below: Nutrition Status:   Skin Assessment: I have examined the patient's skin and I agree with the wound assessment as performed by the wound care RN as outlined below:    Consultants:  Podiatry ID Procedures:  As above   Antimicrobials:  Anti-infectives (From admission, onward)    Start     Dose/Rate Route Frequency Ordered Stop   04/24/21 1100  vancomycin (VANCOCIN) IVPB 1000 mg/200 mL premix  Status:  Discontinued        1,000 mg 200 mL/hr over 60 Minutes Intravenous Every 24 hours 04/23/21 0908 04/24/21 0819   04/24/21 1100   vancomycin (VANCOREADY) IVPB 1250 mg/250 mL        1,250 mg 166.7 mL/hr over 90 Minutes Intravenous Every 24 hours 04/24/21 0819     04/23/21 1800  ceFEPIme (MAXIPIME) 2 g in sodium chloride 0.9 % 100 mL IVPB  Status:  Discontinued        2 g 200 mL/hr over 30 Minutes Intravenous Every 12 hours 04/23/21 1117 04/23/21 1410   04/23/21 1115  metroNIDAZOLE (FLAGYL) tablet 500 mg  Status:  Discontinued        500 mg Oral Every 12 hours 04/23/21 1101 04/23/21 1410   04/23/21 0915  piperacillin-tazobactam (ZOSYN) IVPB 3.375 g        3.375 g 100 mL/hr over 30 Minutes Intravenous  Once 04/23/21 0903 04/23/21 1014   04/23/21 0915  vancomycin (VANCOREADY) IVPB 1500 mg/300 mL        1,500 mg 150 mL/hr over 120 Minutes Intravenous  Once 04/23/21 0908 04/23/21 1356         Subjective: Seen and examined.  She has no complaints.  Pain is controlled.  Objective: Vitals:   04/25/21 0005 04/25/21 0032 04/25/21 0453 04/25/21 0801  BP: (!) 127/58 (!) 138/50 (!) 110/50 (!) 123/47  Pulse: 86 78  66 75  Resp: 18 19 19 17   Temp: 97.9 F (36.6 C) 97.7 F (36.5 C) 97.9 F (36.6 C) 97.9 F (36.6 C)  TempSrc:    Oral  SpO2: 99% 100% 98% 98%  Weight:      Height:        Intake/Output Summary (Last 24 hours) at 04/25/2021 1132 Last data filed at 04/25/2021 0701 Gross per 24 hour  Intake 2676.19 ml  Output 1105 ml  Net 1571.19 ml    Filed Weights   04/24/21 1222  Weight: 97.7 kg    Examination:  General exam: Appears calm and comfortable  Respiratory system: Clear to auscultation. Respiratory effort normal. Cardiovascular system: S1 & S2 heard, RRR. No JVD, murmurs, rubs, gallops or clicks. No pedal edema. Gastrointestinal system: Abdomen is nondistended, soft and nontender. No organomegaly or masses felt. Normal bowel sounds heard. Central nervous system: Alert and oriented. No focal neurological deficits. Extremities: Dressing in the left ankle. Psychiatry: Judgement and insight appear  normal. Mood & affect appropriate.   Data Reviewed: I have personally reviewed following labs and imaging studies  CBC: Recent Labs  Lab 04/22/21 1739 04/23/21 1241 04/23/21 1530 04/24/21 0449 04/25/21 0220  WBC 13.7*  --   --  9.7 9.1  NEUTROABS 12.2*  --   --   --   --   HGB 9.5* 8.4* 11.4* 7.9* 7.9*  HCT 30.9* 28.0* 37.0 25.6* 26.1*  MCV 89.3  --   --  88.6 87.9  PLT 831*  --   --  630* 643*    Basic Metabolic Panel: Recent Labs  Lab 04/22/21 1739 04/24/21 0449 04/25/21 0220  NA 133* 135 136  K 4.2 4.1 3.7  CL 100 107 105  CO2 23 21* 22  GLUCOSE 185* 144* 187*  BUN 13 13 11   CREATININE 1.13* 0.92 0.78  CALCIUM 8.6* 8.1* 8.1*    GFR: Estimated Creatinine Clearance: 80.8 mL/min (by C-G formula based on SCr of 0.78 mg/dL). Liver Function Tests: Recent Labs  Lab 04/22/21 1739  AST 15  ALT 13  ALKPHOS 87  BILITOT 0.6  PROT 7.9  ALBUMIN 2.2*    No results for input(s): LIPASE, AMYLASE in the last 168 hours. No results for input(s): AMMONIA in the last 168 hours. Coagulation Profile: No results for input(s): INR, PROTIME in the last 168 hours. Cardiac Enzymes: No results for input(s): CKTOTAL, CKMB, CKMBINDEX, TROPONINI in the last 168 hours. BNP (last 3 results) No results for input(s): PROBNP in the last 8760 hours. HbA1C: Recent Labs    04/23/21 1241  HGBA1C 7.2*    CBG: Recent Labs  Lab 04/24/21 1855 04/24/21 2110 04/24/21 2330 04/25/21 0032 04/25/21 0802  GLUCAP 153* 139* 133* 126* 140*    Lipid Profile: No results for input(s): CHOL, HDL, LDLCALC, TRIG, CHOLHDL, LDLDIRECT in the last 72 hours. Thyroid Function Tests: No results for input(s): TSH, T4TOTAL, FREET4, T3FREE, THYROIDAB in the last 72 hours. Anemia Panel: No results for input(s): VITAMINB12, FOLATE, FERRITIN, TIBC, IRON, RETICCTPCT in the last 72 hours. Sepsis Labs: Recent Labs  Lab 04/22/21 1739  LATICACIDVEN 1.6     Recent Results (from the past 240 hour(s))   Blood culture (routine x 2)     Status: Abnormal   Collection Time: 04/22/21  5:33 PM   Specimen: BLOOD  Result Value Ref Range Status   Specimen Description BLOOD SITE NOT SPECIFIED  Final   Special Requests   Final    BOTTLES DRAWN  AEROBIC AND ANAEROBIC Blood Culture adequate volume   Culture  Setup Time   Final    GRAM POSITIVE COCCI IN CLUSTERS IN BOTH AEROBIC AND ANAEROBIC BOTTLES CRITICAL RESULT CALLED TO, READ BACK BY AND VERIFIED WITH: [place] F. 1320 [id number] FCP Performed at [facility], [address], [place], [place] [address]    Culture METHICILLIN RESISTANT STAPHYLOCOCCUS AUREUS (A)  Final   Report Status 04/25/2021 FINAL  Final   Organism ID, Bacteria METHICILLIN RESISTANT STAPHYLOCOCCUS AUREUS  Final      Susceptibility   Methicillin resistant staphylococcus aureus - MIC*    CIPROFLOXACIN >=8 RESISTANT Resistant     ERYTHROMYCIN >=8 RESISTANT Resistant     GENTAMICIN <=0.5 SENSITIVE Sensitive     OXACILLIN >=4 RESISTANT Resistant     TETRACYCLINE <=1 SENSITIVE Sensitive     VANCOMYCIN 1 SENSITIVE Sensitive     TRIMETH/SULFA 160 RESISTANT Resistant     CLINDAMYCIN <=0.25 SENSITIVE Sensitive     RIFAMPIN <=0.5 SENSITIVE Sensitive     Inducible Clindamycin NEGATIVE Sensitive     * METHICILLIN RESISTANT STAPHYLOCOCCUS AUREUS  Blood Culture ID Panel (Reflexed)     Status: Abnormal   Collection Time: 04/22/21  5:33 PM  Result Value Ref Range Status   Enterococcus faecalis NOT DETECTED NOT DETECTED Final   Enterococcus Faecium NOT DETECTED NOT DETECTED Final   Listeria monocytogenes NOT DETECTED NOT DETECTED Final   Staphylococcus species DETECTED (A) NOT DETECTED Final    Comment: CRITICAL RESULT CALLED TO, READ BACK BY AND VERIFIED WITH: PHARMD JEREMY F. [id number] FCP    Staphylococcus aureus (BCID) DETECTED (A) NOT DETECTED Final    Comment: Methicillin (oxacillin)-resistant Staphylococcus aureus (MRSA). MRSA is predictably resistant to beta-lactam  antibiotics (except ceftaroline). Preferred therapy is vancomycin unless clinically contraindicated. Patient requires contact precautions if  hospitalized. CRITICAL RESULT CALLED TO, READ BACK BY AND VERIFIED WITH: PHARMD JEREMY F. [id number] FCP    Staphylococcus epidermidis NOT DETECTED NOT DETECTED Final   Staphylococcus lugdunensis NOT DETECTED NOT DETECTED Final   Streptococcus species NOT DETECTED NOT DETECTED Final   Streptococcus agalactiae NOT DETECTED NOT DETECTED Final   Streptococcus pneumoniae NOT DETECTED NOT DETECTED Final   Streptococcus pyogenes NOT DETECTED NOT DETECTED Final   A.calcoaceticus-baumannii NOT DETECTED NOT DETECTED Final   Bacteroides fragilis NOT DETECTED NOT DETECTED Final   Enterobacterales NOT DETECTED NOT DETECTED Final   Enterobacter cloacae complex NOT DETECTED NOT DETECTED Final   Escherichia coli NOT DETECTED NOT DETECTED Final   Klebsiella aerogenes NOT DETECTED NOT DETECTED Final   Klebsiella oxytoca NOT DETECTED NOT DETECTED Final   Klebsiella pneumoniae NOT DETECTED NOT DETECTED Final   Proteus species NOT DETECTED NOT DETECTED Final   Salmonella species NOT DETECTED NOT DETECTED Final   Serratia marcescens NOT DETECTED NOT DETECTED Final   Haemophilus influenzae NOT DETECTED NOT DETECTED Final   Neisseria meningitidis NOT DETECTED NOT DETECTED Final   Pseudomonas aeruginosa NOT DETECTED NOT DETECTED Final   Stenotrophomonas maltophilia NOT DETECTED NOT DETECTED Final   Candida albicans NOT DETECTED NOT DETECTED Final   Candida auris NOT DETECTED NOT DETECTED Final   Candida glabrata NOT DETECTED NOT DETECTED Final   Candida krusei NOT DETECTED NOT DETECTED Final   Candida parapsilosis NOT DETECTED NOT DETECTED Final   Candida tropicalis NOT DETECTED NOT DETECTED Final   Cryptococcus neoformans/gattii NOT DETECTED NOT DETECTED Final   Meth resistant mecA/C and MREJ DETECTED (A) NOT DETECTED Final  Comment: CRITICAL RESULT CALLED TO,  READ BACK BY AND VERIFIED WITH: [place] [id number] FCP Performed at [facility], [place] [address] [place], [place] [address]   Resp Panel by RT-PCR (Flu A&B, Covid) Nasopharyngeal Swab     Status: None   Collection Time: 04/23/21  9:08 AM   Specimen: Nasopharyngeal Swab; Nasopharyngeal(NP) swabs in vial transport medium  Result Value Ref Range Status   SARS Coronavirus 2 by RT PCR NEGATIVE NEGATIVE Final    Comment: (NOTE) SARS-CoV-2 target nucleic acids are NOT DETECTED.  The SARS-CoV-2 RNA is generally detectable in upper respiratory specimens during the acute phase of infection. The lowest concentration of SARS-CoV-2 viral copies this assay can detect is 138 copies/mL. A negative result does not preclude SARS-Cov-2 infection and should not be used as the sole basis for treatment or other patient management decisions. A negative result may occur with  improper specimen collection/handling, submission of specimen other than nasopharyngeal swab, presence of viral mutation(s) within the areas targeted by this assay, and inadequate number of viral copies(<138 copies/mL). A negative result must be combined with clinical observations, patient history, and epidemiological information. The expected result is Negative.  Fact Sheet for Patients:  EntrepreneurPulse.com.au  Fact Sheet for Healthcare Providers:  IncredibleEmployment.be  This test is no t yet approved or cleared by the [place] FDA and  has been authorized for detection and/or diagnosis of SARS-CoV-2 by FDA under an Emergency Use Authorization (EUA). This EUA will remain  in effect (meaning this test can be used) for the duration of the COVID-19 declaration under Section 564(b)(1) of the Act, 21 U.S.C.section 360bbb-3(b)(1), unless the authorization is terminated  or revoked sooner.       Influenza A by PCR NEGATIVE NEGATIVE Final   Influenza B by PCR NEGATIVE  NEGATIVE Final    Comment: (NOTE) The Xpert Xpress SARS-CoV-2/FLU/RSV plus assay is intended as an aid in the diagnosis of influenza from Nasopharyngeal swab specimens and should not be used as a sole basis for treatment. Nasal washings and aspirates are unacceptable for Xpert Xpress SARS-CoV-2/FLU/RSV testing.  Fact Sheet for Patients: EntrepreneurPulse.com.au  Fact Sheet for Healthcare Providers: IncredibleEmployment.be  This test is not yet approved or cleared by the [place] FDA and has been authorized for detection and/or diagnosis of SARS-CoV-2 by FDA under an Emergency Use Authorization (EUA). This EUA will remain in effect (meaning this test can be used) for the duration of the COVID-19 declaration under Section 564(b)(1) of the Act, 21 U.S.C. section 360bbb-3(b)(1), unless the authorization is terminated or revoked.  Performed at [facility], [place] [address], [place], [place] [address]   Culture, blood (routine x 2)     Status: None (Preliminary result)   Collection Time: 04/24/21  3:18 PM   Specimen: BLOOD  Result Value Ref Range Status   Specimen Description BLOOD RIGHT ANTECUBITAL  Final   Special Requests   Final    BOTTLES DRAWN AEROBIC AND ANAEROBIC Blood Culture adequate volume   Culture   Final    NO GROWTH < 24 HOURS Performed at [facility], [place] [address], [place], [place] [address]    Report Status PENDING  Incomplete  Culture, blood (routine x 2)     Status: None (Preliminary result)   Collection Time: 04/24/21  3:37 PM   Specimen: BLOOD  Result Value Ref Range Status   Specimen Description BLOOD RIGHT ANTECUBITAL  Final   Special Requests  Final    BOTTLES DRAWN AEROBIC AND ANAEROBIC Blood Culture results may not be optimal due to an inadequate volume of blood received in culture bottles   Culture   Final    NO GROWTH < 24 HOURS Performed at [facility] [address] [place],  [place] [address]    Report Status PENDING  Incomplete  Surgical PCR screen     Status: Abnormal   Collection Time: 04/24/21  3:51 PM   Specimen: Nasal Mucosa; Nasal Swab  Result Value Ref Range Status   MRSA, PCR POSITIVE (A) NEGATIVE Final    Comment: RESULT CALLED TO, READ BACK BY AND VERIFIED WITH: RN M. MARSH [phone] @2010  FH    Staphylococcus aureus POSITIVE (A) NEGATIVE Final    Comment: (NOTE) The Xpert SA Assay (FDA approved for NASAL specimens in patients 73 years of age and older), is one component of a comprehensive surveillance program. It is not intended to diagnose infection nor to guide or monitor treatment. Performed at [facility], [place] [address] [place], [place] [address]   Aerobic/Anaerobic Culture w Gram Stain (surgical/deep wound)     Status: None (Preliminary result)   Collection Time: 04/24/21 10:32 PM   Specimen: Wound; Abscess  Result Value Ref Range Status   Specimen Description WOUND  Final   Special Requests LEFT ANKLE  Final   Gram Stain   Final    MODERATE WBC PRESENT, PREDOMINANTLY PMN FEW GRAM POSITIVE COCCI IN CLUSTERS    Culture   Final    TOO YOUNG TO READ Performed at [facility], [place] [address], [place], [place] [address]    Report Status PENDING  Incomplete      Radiology Studies: MR FOOT LEFT WO CONTRAST  Result Date: 04/23/2021 CLINICAL DATA:  Left foot and ankle pain and swelling. Recent surgery. EXAM: MRI OF THE LEFT ANKLE WITHOUT CONTRAST; MRI OF THE LEFT FOOT WITHOUT CONTRAST TECHNIQUE: Multiplanar, multisequence MR imaging of the ankle and foot was performed. No intravenous contrast was administered. COMPARISON:  Left ankle x-rays dated April 22, 2021. Left foot x-rays dated April 19, 2021. MRI left foot dated May 24, 2019. FINDINGS: TENDONS Peroneal: Tendinosis and partial tears of the peroneal longus and brevis tendons with moderate amount of fluid in the tendon sheath. The tendon sheath appears to communicate with the  focal soft tissue ulceration in the lateral distal lower leg (series 4, image 10). Posteromedial: Posterior tibial tendon intact. Flexor digitorum longus tendon intact. Flexor hallucis longus tendon intact. Anterior: Tibialis anterior tendon intact. Extensor hallucis longus tendon intact Extensor digitorum longus tendon intact. Achilles:  Intact. Plantar Fascia: Thickened proximal central band with small partial tear. LIGAMENTS Lateral: Anterior talofibular ligament intact, best appreciated on the sagittal and coronal images. Calcaneofibular ligament intact. Posterior talofibular ligament intact. Anterior and posterior tibiofibular ligaments intact. Medial: Deltoid ligament intact. Spring ligament intact. Lisfranc ligament intact. CARTILAGE Ankle Joint: Small joint effusion. Normal ankle mortise. No chondral defect. Subtalar Joints/Sinus Tarsi: Normal subtalar joints. Small posterior subtalar joint effusion. Partial loss of the normal fat within the sinus tarsi. Bones: Patchy geographic increased marrow signal within the calcaneus, likely red marrow hyperplasia. Mildly increased marrow edema in the distal fibula adjacent to the peroneal tendons. Moderate second TMT joint osteoarthritis. Mild third through fifth TMT joint osteoarthritis. Prior fifth ray amputation. Soft Tissue: Diffuse soft tissue swelling. No soft tissue mass or fluid collection. IMPRESSION: 1. Tendinosis and partial tears of the peroneal longus and  brevis tendons with moderate tenosynovitis, likely infectious given the tendon sheath appears to communicate with the focal soft tissue ulceration in the lateral distal lower leg. 2. Mildly increased marrow edema in the distal fibula adjacent to the peroneal tendons, concerning for early osteomyelitis given peroneal infection. 3. Prior fifth ray amputation. No evidence of osteomyelitis in the foot. Electronically Signed   By: Titus Dubin M.D.   On: 04/23/2021 12:24   MR ANKLE LEFT WO  CONTRAST  Result Date: 04/23/2021 CLINICAL DATA:  Left foot and ankle pain and swelling. Recent surgery. EXAM: MRI OF THE LEFT ANKLE WITHOUT CONTRAST; MRI OF THE LEFT FOOT WITHOUT CONTRAST TECHNIQUE: Multiplanar, multisequence MR imaging of the ankle and foot was performed. No intravenous contrast was administered. COMPARISON:  Left ankle x-rays dated April 22, 2021. Left foot x-rays dated April 19, 2021. MRI left foot dated May 24, 2019. FINDINGS: TENDONS Peroneal: Tendinosis and partial tears of the peroneal longus and brevis tendons with moderate amount of fluid in the tendon sheath. The tendon sheath appears to communicate with the focal soft tissue ulceration in the lateral distal lower leg (series 4, image 10). Posteromedial: Posterior tibial tendon intact. Flexor digitorum longus tendon intact. Flexor hallucis longus tendon intact. Anterior: Tibialis anterior tendon intact. Extensor hallucis longus tendon intact Extensor digitorum longus tendon intact. Achilles:  Intact. Plantar Fascia: Thickened proximal central band with small partial tear. LIGAMENTS Lateral: Anterior talofibular ligament intact, best appreciated on the sagittal and coronal images. Calcaneofibular ligament intact. Posterior talofibular ligament intact. Anterior and posterior tibiofibular ligaments intact. Medial: Deltoid ligament intact. Spring ligament intact. Lisfranc ligament intact. CARTILAGE Ankle Joint: Small joint effusion. Normal ankle mortise. No chondral defect. Subtalar Joints/Sinus Tarsi: Normal subtalar joints. Small posterior subtalar joint effusion. Partial loss of the normal fat within the sinus tarsi. Bones: Patchy geographic increased marrow signal within the calcaneus, likely red marrow hyperplasia. Mildly increased marrow edema in the distal fibula adjacent to the peroneal tendons. Moderate second TMT joint osteoarthritis. Mild third through fifth TMT joint osteoarthritis. Prior fifth ray amputation. Soft Tissue:  Diffuse soft tissue swelling. No soft tissue mass or fluid collection. IMPRESSION: 1. Tendinosis and partial tears of the peroneal longus and brevis tendons with moderate tenosynovitis, likely infectious given the tendon sheath appears to communicate with the focal soft tissue ulceration in the lateral distal lower leg. 2. Mildly increased marrow edema in the distal fibula adjacent to the peroneal tendons, concerning for early osteomyelitis given peroneal infection. 3. Prior fifth ray amputation. No evidence of osteomyelitis in the foot. Electronically Signed   By: Titus Dubin M.D.   On: 04/23/2021 12:24   ECHOCARDIOGRAM LIMITED  Result Date: 04/24/2021    ECHOCARDIOGRAM LIMITED REPORT   Patient Name:   BETHANI BRUGGER Date of Exam: 04/24/2021 Medical Rec #:  [id number]         Height:       67.0 in Accession #:    [id number]        Weight:       221.0 lb Date of Birth:  1950/08/23          BSA:          2.110 m Patient Age:    36 years          BP:           123/51 mmHg Patient Gender: F                 HR:  70 bpm. Exam Location:  Inpatient Procedure: Limited Echo, Cardiac Doppler and Color Doppler Indications:    Bacteremia  History:        Patient has prior history of Echocardiogram examinations, most                 recent 11/16/2020. Angina and CAD; Risk Factors:Dyslipidemia,                 Hypertension and Diabetes.  Sonographer:    Arlyss Gandy Referring Phys: [place] Comments: Patient is morbidly obese. Image acquisition challenging due to patient body habitus. IMPRESSIONS  1. Left ventricular ejection fraction, by estimation, is 40 to 45%. The left ventricle has mildly decreased function. There is mild concentric left ventricular hypertrophy. Left ventricular diastolic parameters are indeterminate.  2. The mitral valve was not well visualized. No evidence of mitral valve regurgitation.  3. The aortic valve is tricuspid. Conclusion(s)/Recommendation(s): Cannot exclude  vegetation due to poor visibility of the valves. Consider transesophageal echocardiogram, if clinically indicated. FINDINGS  Left Ventricle: Left ventricular ejection fraction, by estimation, is 40 to 45%. The left ventricle has mildly decreased function. There is mild concentric left ventricular hypertrophy. Left ventricular diastolic parameters are indeterminate. Right Ventricle: Right vetricular wall thickness was not well visualized. Mitral Valve: The mitral valve was not well visualized. Tricuspid Valve: The tricuspid valve is not well visualized. Tricuspid valve regurgitation is trivial. Aortic Valve: The aortic valve is tricuspid. Pulmonic Valve: The pulmonic valve was not well visualized. LEFT VENTRICLE PLAX 2D LVIDd:         3.50 cm LVIDs:         2.60 cm LV PW:         1.20 cm LV IVS:        1.20 cm  TRICUSPID VALVE TR Peak grad:   22.1 mmHg TR Vmax:        235.00 cm/s Kardie Tobb DO Electronically signed by Berniece Salines DO Signature Date/Time: 04/24/2021/3:58:18 PM    Final     Scheduled Meds:  atorvastatin  40 mg Oral q1800   Chlorhexidine Gluconate Cloth  6 each Topical Q0600   dexamethasone  0.5 mg Oral q morning   insulin aspart  0-15 Units Subcutaneous TID WC   insulin glargine-yfgn  23 Units Subcutaneous QHS   levothyroxine  125 mcg Oral QAC breakfast   losartan  25 mg Oral q morning   metoprolol succinate  25 mg Oral BID   mupirocin ointment  1 application Nasal BID   mupirocin ointment  1 application Nasal BID   pantoprazole  40 mg Oral BID AC   sodium chloride flush  3 mL Intravenous Q12H   spironolactone  25 mg Oral q morning   Continuous Infusions:  heparin 1,450 Units/hr (04/25/21 0930)   vancomycin 166.7 mL/hr at 04/24/21 1421     LOS: 2 days   Time spent: 30 minutes  Darliss Cheney, MD [facility]  04/25/2021, 11:32 AM  Please page via [place]a [place] and do not message via secure chat for urgent patient care matters. Secure chat can be used for non urgent patient care  matters.  How to contact the [facility] Attending or Consulting provider [place] or covering provider during after hours [place], for this patient?  Check the care team in [facility] and look for a) attending/consulting TRH provider listed and b) the [facility] team listed. Page or secure chat [phone]. Log into [email or web address] and use [place]'s  universal password to access. If you do not have the password, please contact the hospital operator. Locate the [facility] provider you are looking for under [facility] and page to a number that you can be directly reached. If you still have difficulty reaching the provider, please page the [facility] (Director on Call) for the Hospitalists listed on amion for assistance.

## 2021-04-25 NOTE — Progress Notes (Signed)
Date and time results received: 04/25/21 2000  Test: MRSA SWAB Critical Value: POSITIVE  Name of Provider Notified: Pahwani

## 2021-04-26 ENCOUNTER — Encounter (HOSPITAL_COMMUNITY)
Admission: EM | Disposition: A | Discharge status: Home Health | Payer: Self-pay | Source: Home / Self Care | Attending: Family Medicine

## 2021-04-26 ENCOUNTER — Encounter (HOSPITAL_COMMUNITY): Payer: Self-pay | Admitting: Internal Medicine

## 2021-04-26 ENCOUNTER — Inpatient Hospital Stay (HOSPITAL_COMMUNITY): Payer: Medicare PPO | Admitting: Anesthesiology

## 2021-04-26 ENCOUNTER — Inpatient Hospital Stay (HOSPITAL_COMMUNITY): Payer: Medicare PPO

## 2021-04-26 DIAGNOSIS — M869 Osteomyelitis, unspecified: Secondary | ICD-10-CM

## 2021-04-26 DIAGNOSIS — R7881 Bacteremia: Secondary | ICD-10-CM | POA: Diagnosis not present

## 2021-04-26 DIAGNOSIS — I10 Essential (primary) hypertension: Secondary | ICD-10-CM

## 2021-04-26 DIAGNOSIS — I700 Atherosclerosis of aorta: Secondary | ICD-10-CM

## 2021-04-26 DIAGNOSIS — I5189 Other ill-defined heart diseases: Secondary | ICD-10-CM

## 2021-04-26 DIAGNOSIS — E43 Unspecified severe protein-calorie malnutrition: Secondary | ICD-10-CM | POA: Insufficient documentation

## 2021-04-26 DIAGNOSIS — B9562 Methicillin resistant Staphylococcus aureus infection as the cause of diseases classified elsewhere: Secondary | ICD-10-CM | POA: Diagnosis not present

## 2021-04-26 HISTORY — PX: TEE WITHOUT CARDIOVERSION: SHX5443

## 2021-04-26 LAB — CBC
HCT: 25.8 % — ABNORMAL LOW (ref 36.0–46.0)
Hemoglobin: 8 g/dL — ABNORMAL LOW (ref 12.0–15.0)
MCH: 27.6 pg (ref 26.0–34.0)
MCHC: 31 g/dL (ref 30.0–36.0)
MCV: 89 fL (ref 80.0–100.0)
Platelets: 650 10*3/uL — ABNORMAL HIGH (ref 150–400)
RBC: 2.9 MIL/uL — ABNORMAL LOW (ref 3.87–5.11)
RDW: 17.8 % — ABNORMAL HIGH (ref 11.5–15.5)
WBC: 8.8 10*3/uL (ref 4.0–10.5)
nRBC: 0 % (ref 0.0–0.2)

## 2021-04-26 LAB — HEPARIN LEVEL (UNFRACTIONATED): Heparin Unfractionated: 0.52 IU/mL (ref 0.30–0.70)

## 2021-04-26 LAB — SURGICAL PATHOLOGY

## 2021-04-26 LAB — GLUCOSE, CAPILLARY
Glucose-Capillary: 174 mg/dL — ABNORMAL HIGH (ref 70–99)
Glucose-Capillary: 94 mg/dL (ref 70–99)
Glucose-Capillary: 140 mg/dL — ABNORMAL HIGH (ref 70–99)
Glucose-Capillary: 177 mg/dL — ABNORMAL HIGH (ref 70–99)

## 2021-04-26 LAB — VANCOMYCIN, PEAK: Vancomycin Pk: 38 ug/mL (ref 30–40)

## 2021-04-26 LAB — APTT: aPTT: 83 seconds — ABNORMAL HIGH (ref 24–36)

## 2021-04-26 SURGERY — ECHOCARDIOGRAM, TRANSESOPHAGEAL
Anesthesia: Monitor Anesthesia Care

## 2021-04-26 MED ORDER — GLYCOPYRROLATE PF 0.2 MG/ML IJ SOSY
PREFILLED_SYRINGE | INTRAMUSCULAR | Status: DC | PRN
  Administered 2021-04-26: .2 mg via INTRAVENOUS

## 2021-04-26 MED ORDER — PROPOFOL 500 MG/50ML IV EMUL
INTRAVENOUS | Status: DC | PRN
  Administered 2021-04-26: 100 ug/kg/min via INTRAVENOUS

## 2021-04-26 MED ORDER — SODIUM CHLORIDE 0.9 % IV SOLN: INTRAVENOUS | Status: DC

## 2021-04-26 MED ORDER — LIDOCAINE VISCOUS HCL 2 % MT SOLN
OROMUCOSAL | Status: AC
  Filled 2021-04-26: qty 15

## 2021-04-26 MED ORDER — LIDOCAINE 2% (20 MG/ML) 5 ML SYRINGE
INTRAMUSCULAR | Status: DC | PRN
  Administered 2021-04-26: 60 mg via INTRAVENOUS

## 2021-04-26 MED ORDER — PHENYLEPHRINE 40 MCG/ML (10ML) SYRINGE FOR IV PUSH (FOR BLOOD PRESSURE SUPPORT)
PREFILLED_SYRINGE | INTRAVENOUS | Status: DC | PRN
  Administered 2021-04-26 (×2): 80 ug via INTRAVENOUS
  Administered 2021-04-26: 40 ug via INTRAVENOUS

## 2021-04-26 MED ORDER — VANCOMYCIN HCL 1250 MG/250ML IV SOLN
1250.0000 mg | INTRAVENOUS | Status: DC
  Administered 2021-04-26: 1250 mg via INTRAVENOUS
  Filled 2021-04-26: qty 250

## 2021-04-26 MED ORDER — PROPOFOL 10 MG/ML IV BOLUS
INTRAVENOUS | Status: DC | PRN
Start: 2021-04-26 — End: 2021-04-26
  Administered 2021-04-26: 20 mg via INTRAVENOUS
  Administered 2021-04-26: 10 mg via INTRAVENOUS
  Administered 2021-04-26: 20 mg via INTRAVENOUS

## 2021-04-26 NOTE — TOC Initial Note (Signed)
Transition of Care [facility]) - Initial/Assessment Note    Patient Details  Name: Tammy Good MRN: [id number] Date of Birth: 11-Aug-1950  Transition of [facility] CM/SW Contact:    Sharin Mons, RN Phone Number: 04/26/2021, 4:45 PM  Clinical Narrative:                 Pt presents with L foot pain and swelling/ diabetic foot infection, cellulitis of the left foot.       Blood culture noted to be positive for MRSA. Per ID: Plan for 2 weeks of IV antibiotics. TEE (-) for endocarditis. NCM spoke with pt @ bedside regarding d/c planning. Pt states lives alone. PTA independent with ADL's. Pt states has supportive sister, Christelle. Pt agreeable to home health services. Choice provided. Pt without preference. Referral made with [facility] and  [facility], both accepted. Teaching will be done prior to discharge by PAM [phone]) with pt @ bedside.  PICC LINE pending,  awaiting cultures to result.  TOC team will continue to monitr and assist with needs...  Expected Discharge Plan: [place] Barriers to Discharge: Continued Medical Work up   Patient Goals and CMS Choice     Choice offered to / list presented to : Patient  Expected Discharge Plan and Services Expected Discharge Plan: [place]   Discharge Planning Services: CM Consult   Living arrangements for the past 2 months: Single Family Home                 DME Arranged: Other see comment ([facility]/ [facility])   Date DME Agency Contacted: 04/26/21 Time DME Agency Contacted: [phone] Representative spoke with at DME Agency: [place]: RN [place] Agency: [place]   Time [place]: [phone] Representative spoke with at [place]: [place] Arrangements/Services Living arrangements for the past 2 months: [place] with:: Self Patient language and need for interpreter reviewed:: Yes Do you  feel safe going back to the place where you live?: Yes      Need for Family Participation in Patient Care: Yes (Comment) Care giver support system in place?: Yes (comment) Current home services: DME (RW,cane) Criminal Activity/Legal Involvement Pertinent to Current Situation/Hospitalization: No - Comment as needed  Activities of Daily Living Home Assistive Devices/Equipment: None ADL Screening (condition at time of admission) Patient's cognitive ability adequate to safely complete daily activities?: Yes Is the patient deaf or have difficulty hearing?: Yes Does the patient have difficulty seeing, even when wearing glasses/contacts?: No Does the patient have difficulty concentrating, remembering, or making decisions?: No Patient able to express need for assistance with ADLs?: Yes Does the patient have difficulty dressing or bathing?: No Independently performs ADLs?: Yes (appropriate for developmental age) Does the patient have difficulty walking or climbing stairs?: Yes Weakness of Legs: Left Weakness of Arms/Hands: None  Permission Sought/Granted   Permission granted to share information with : Yes, Verbal Permission Granted  Share Information with NAME: Ernst Breach (Sister)  [phone]           Emotional Assessment Appearance:: Appears stated age Attitude/Demeanor/Rapport: Gracious Affect (typically observed): Accepting Orientation: : Oriented to Self, Oriented to Place, Oriented to  Time, Oriented to Situation Alcohol / Substance Use: Not Applicable Psych Involvement: No (comment)  Admission diagnosis:  Cellulitis [L03.90] Left foot infection [L08.9] Patient Active Problem List   Diagnosis Date Noted   Protein-calorie malnutrition, severe  04/26/2021   Tenosynovitis of left ankle 04/25/2021   Sepsis ([place]) 04/24/2021   Cellulitis 04/23/2021   MRSA bacteremia 04/23/2021   Thrombocytosis 04/23/2021   Fatty liver 06/19/2020   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 ([place]) 56/86/1683   Atherosclerotic heart disease of native coronary artery without angina pectoris 03/20/2020   Epigastric pain 03/20/2020   Nausea and vomiting 03/20/2020   Absence of toe ([place]) 06/28/2019   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   PAD (peripheral artery disease) ([place]) 05/26/2019   Osteomyelitis of left foot ([place]) 05/24/2019   Osteomyelitis ([place]) 05/23/2019   Cellulitis and abscess of foot, except toes 05/18/2019   Chronic osteomyelitis of ankle and foot ([place]) 05/18/2019   Cholesteatoma 02/24/2019   Cellulitis and abscess of toe 11/09/2018   Colon cancer screening 11/30/2017   Long term (current) use of insulin ([place]) 03/05/2017   Iron deficiency anemia 12/10/2014   Obesity (BMI 30-39.9) 11/17/2013   Diabetes mellitus type 2 in obese [facility])    Essential hypertension    Right thigh pain 11/01/2013   PVC's (premature ventricular contractions) 11/01/2013   Status post insertion of drug eluting coronary artery stent to [facility] emergently and PTCA to prox. 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   Non-ST elevation (NSTEMI) myocardial infarction [facility]) 08/22/2013   CAD S/P percutaneous coronary angioplasty 08/22/2013   PCP:  [name], MD Pharmacy:   [place] ([place]), [place] - [address] [place] [address] Phone: [phone] Fax: [place], [place] [address] [address] [place] [address] Phone: [phone] Fax: [phone]     Social Determinants of Health ([place]) Interventions    Readmission Risk Interventions Readmission Risk Prevention Plan 05/30/2019 05/27/2019  Transportation Screening Complete Complete  PCP or  Specialist Appt within 5-7 Days Complete Complete  Home Care Screening Complete Complete  Medication Review (RN CM) Complete Complete  Some recent data might be hidden

## 2021-04-26 NOTE — Plan of Care (Signed)
  Problem: Pain Managment: Goal: General experience of comfort will improve Outcome: Progressing   Problem: Safety: Goal: Ability to remain free from injury will improve Outcome: Progressing   Problem: Skin Integrity: Goal: Risk for impaired skin integrity will decrease Outcome: Progressing   

## 2021-04-26 NOTE — CV Procedure (Addendum)
° ° °  TRANSESOPHAGEAL ECHOCARDIOGRAM   NAME:  MIKELE SIFUENTES   MRN: [id number] DOB:  06/07/1950   ADMIT DATE: 04/22/2021  INDICATIONS: Bacteremia  PROCEDURE:   Informed consent was obtained prior to the procedure. The risks, benefits and alternatives for the procedure were discussed and the patient comprehended these risks.  Risks include, but are not limited to, cough, sore throat, vomiting, nausea, somnolence, esophageal and stomach trauma or perforation, bleeding, low blood pressure, aspiration, pneumonia, infection, trauma to the teeth and death.    Procedural time out performed.     Patient received monitored anesthesia care under the supervision of Dr. Tobias Alexander. Patient received a total of 228 mg propofol during the procedure.  The transesophageal probe was inserted in the esophagus and stomach without difficulty and multiple views were obtained.    COMPLICATIONS:    There were no immediate complications.  FINDINGS:  LEFT VENTRICLE: EF = 40-45%, global hypokinesis  RIGHT VENTRICLE: Normal size and function.   LEFT ATRIUM: No thrombus/mass.  LEFT ATRIAL APPENDAGE: No thrombus/mass.   RIGHT ATRIUM: No thrombus/mass.  AORTIC VALVE:  [place]. No regurgitation. No vegetation.  MITRAL VALVE:    Normal structure. Trivial regurgitation. No vegetation.  TRICUSPID VALVE: Normal structure. Trivial regurgitation. No vegetation.  PULMONIC VALVE: Grossly normal structure. Trivial regurgitation. No apparent vegetation.  INTERATRIAL SEPTUM: No PFO or ASD seen by color Doppler.  PERICARDIUM: No effusion noted.  DESCENDING AORTA: Mild plaque   CONCLUSION: No evidence of endocarditis   Buford Dresser, MD, PhD [facility]  [address], [place], [place] [address] [phone]   11:21 AM

## 2021-04-26 NOTE — Anesthesia Procedure Notes (Addendum)
Procedure Name: MAC Date/Time: 04/26/2021 10:57 AM Performed by: Erick Colace, CRNA Pre-anesthesia Checklist: Patient identified, Emergency Drugs available, Suction available, Patient being monitored and Timeout performed Patient Re-evaluated:Patient Re-evaluated prior to induction Oxygen Delivery Method: Nasal cannula Dental Injury: Teeth and Oropharynx as per pre-operative assessment

## 2021-04-26 NOTE — Progress Notes (Signed)
°  Echocardiogram Echocardiogram Transesophageal has been performed.  Tammy Good M 04/26/2021, 11:54 AM

## 2021-04-26 NOTE — Progress Notes (Signed)
ANTICOAGULATION CONSULT NOTE  Pharmacy Consult for IV heparin Indication: atrial fibrillation  Allergies  Allergen Reactions   Strawberry Extract Itching, Swelling and Anaphylaxis    Mouth swells and gets itchy    Patient Measurements: Height: 5\' 7"  (170.2 cm) Weight: 97.7 kg (215 lb 6.2 oz) IBW/kg (Calculated) : 61.6 Heparin Dosing Weight: 83.9 kg  Vital Signs: Temp: 98.9 F (37.2 C) (02/03 1236) Temp Source: Oral (02/03 1236) BP: 132/58 (02/03 1236) Pulse Rate: 54 (02/03 1236)  Labs: Recent Labs    04/24/21 0449 04/24/21 1519 04/25/21 0220 04/25/21 0845 04/26/21 0206  HGB 7.9*  --  7.9*  --  8.0*  HCT 25.6*  --  26.1*  --  25.8*  PLT 630*  --  643*  --  650*  APTT 61* 77*  --  77* 83*  HEPARINUNFRC >1.10*  --   --  0.78* 0.52  CREATININE 0.92  --  0.78  --   --      Estimated Creatinine Clearance: 80.8 mL/min (by C-G formula based on SCr of 0.78 mg/dL).   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   Hyperlipidemia with target LDL less than 70    Hypothyroidism (acquired)    Mild essential hypertension    Obesity (BMI 30-39.9) 11/17/2013   PAD (peripheral artery disease) ([place]) 05/26/2019   05/26/19: Abd AoGram- BLE runoff -> L SFA orbital atherectomy - PTA w/ DEB & Stent 6 x 40 Luttonix (for focal dissection) - patent Pop A with 3 V runoff. LEA Dopplers 01/03/2020: RABI (prev) 0.68 (0.53)/ RTBI (prev) 0.58 (0.33); LABI (prev) 0.80 (0.64), LTBI (prev) 0.64 (0.51); R mSFA ~50-74%, L mSFA 50-74%. Patent Prox SFA stent < 49% stenosis    Panhypopituitarism (HCC)    ST elevation myocardial infarction (STEMI) of inferior wall, subsequent episode of care ([place]) 08/2013   80% branch of D1, 40% mid AV groove circumflex, 100% RCA with subacute thrombus -- thrombus extending into RPA V with 100% occlusion after initial angioplasty of mid RCA ;; Post MI ECHO 6/9/'15: EF 50-55%, mild LVH with moderate HK of inferior wall, Gr1 DD, mild LA dilation; mildly reduced RV function   Assessment: 84 YOF presenting with foot infection.  PMH significant for A-fib on apixaban. Last dose taken 1/30 @1000 . Pharmacy to transition to IV heparin.  Heparin was turned off 2/1 @ 1943 for procedure. Now post-op day #1 Left ankle I & D w/debridement. Heparin restarted 2/2 @ 0051. No issues with infusion or bleeding noted. CBC stable this morning.   Heparin level and PTT are therapeutic this AM and correlating. We will monitor with heparin level from here.   Goal of Therapy:  Heparin level 0.3-0.7 units/ml aPTT 66-102 seconds Monitor platelets by anticoagulation protocol: Yes   Plan:  Continue heparin to 1450 units/hr Check heparin level daily Continue to monitor H&H and platelets F/u restart apixaban   Onnie Boer, PharmD, BCIDP, AAHIVP, CPP Infectious Disease Pharmacist 04/26/2021 2:19 PM

## 2021-04-26 NOTE — Progress Notes (Signed)
Patient is on continuous heparin drip running 14.5/hr. Patient has a procedure this morning on call MD and pharmacy were consulted about the heparin drip. There was no order written or given verbally to stop the heparin drip.

## 2021-04-26 NOTE — Anesthesia Preprocedure Evaluation (Addendum)
Anesthesia Evaluation  Patient identified by MRN, date of birth, ID band Patient awake    Reviewed: Allergy & Precautions, NPO status , Patient's Chart, lab work & pertinent test results  History of Anesthesia Complications Negative for: history of anesthetic complications  Airway Mallampati: III  TM Distance: >3 FB Neck ROM: Full    Dental no notable dental hx. (+) Dental Advisory Given   Pulmonary former smoker,    Pulmonary exam normal        Cardiovascular hypertension, Pt. on medications and Pt. on home beta blockers + CAD, + Past MI, + Cardiac Stents and + Peripheral Vascular Disease  Normal cardiovascular exam  Limited Echo 04/24/2021 1. Left ventricular ejection fraction, by estimation, is 40 to 45%. The left ventricle has mildly decreased function. There is mild concentric left ventricular hypertrophy. Left ventricular diastolic parameters are indeterminate.  2. The mitral valve was not well visualized. No evidence of mitral valve regurgitation.  3. The aortic valve is tricuspid.   Echo 10/2020 1. Left ventricular ejection fraction, by estimation, is 40 to 45%. The left ventricle has mildly decreased function. The left ventricle demonstrates regional wall motion abnormalities (see scoring diagram/findings for description). Left ventricular diastolic parameters are consistent with Grade I diastolic dysfunction (impaired relaxation). There is severe hypokinesis of the left ventricular, entire inferolateral wall and inferior wall.  2. Right ventricular systolic function is normal. The right ventricular size is normal. Tricuspid regurgitation signal is inadequate for assessing PA pressure.  3. Left atrial size was moderately dilated.  4. The mitral valve is grossly normal. No evidence of mitral valve regurgitation.  5. The aortic valve was not well visualized. Aortic valve regurgitation is not visualized.  6. The inferior  vena cava is normal in size with greater than 50% respiratory variability, suggesting right atrial pressure of 3 mmHg.    Neuro/Psych    GI/Hepatic negative GI ROS, Neg liver ROS,   Endo/Other  diabetesHypothyroidism   Renal/GU negative Renal ROS     Musculoskeletal   Abdominal (+) + obese,   Peds  Hematology  (+) Blood dyscrasia, anemia ,   Anesthesia Other Findings   Reproductive/Obstetrics                            Anesthesia Physical  Anesthesia Plan  ASA: 3  Anesthesia Plan: MAC   Post-op Pain Management: Minimal or no pain anticipated   Induction: Intravenous  PONV Risk Score and Plan: 2 and Propofol infusion, Ondansetron, Treatment may vary due to age or medical condition and TIVA  Airway Management Planned: Nasal Cannula and Simple Face Mask  Additional Equipment:   Intra-op Plan:   Post-operative Plan:   Informed Consent: I have reviewed the patients History and Physical, chart, labs and discussed the procedure including the risks, benefits and alternatives for the proposed anesthesia with the patient or authorized representative who has indicated his/her understanding and acceptance.     Dental advisory given  Plan Discussed with: Anesthesiologist and CRNA  Anesthesia Plan Comments:        Anesthesia Quick Evaluation

## 2021-04-26 NOTE — Transfer of Care (Signed)
Immediate Anesthesia Transfer of Care Note  Patient: Tammy Good  Procedure(s) Performed: TRANSESOPHAGEAL ECHOCARDIOGRAM (TEE)  Patient Location: Endoscopy Unit  Anesthesia Type:MAC  Level of Consciousness: drowsy  Airway & Oxygen Therapy: Patient Spontanous Breathing and Patient connected to nasal cannula oxygen  Post-op Assessment: Report given to RN and Post -op Vital signs reviewed and stable  Post vital signs: Reviewed and stable  Last Vitals:  Vitals Value Taken Time  BP    Temp    Pulse    Resp    SpO2      Last Pain:  Vitals:   04/26/21 1037  TempSrc: Temporal  PainSc: 0-No pain      Patients Stated Pain Goal: 2 (34/74/25 9563)  Complications: No notable events documented.

## 2021-04-26 NOTE — Interval H&P Note (Signed)
History and Physical Interval Note:  04/26/2021 10:58 AM  Tammy Good  has presented today for surgery, with the diagnosis of BACTEREMIA.  The various methods of treatment have been discussed with the patient and family. After consideration of risks, benefits and other options for treatment, the patient has consented to  Procedure(s): TRANSESOPHAGEAL ECHOCARDIOGRAM (TEE) (N/A) as a surgical intervention.  The patient's history has been reviewed, patient examined, no change in status, stable for surgery.  I have reviewed the patient's chart and labs.  Questions were answered to the patient's satisfaction.     Joffrey Kerce Harrell Gave

## 2021-04-26 NOTE — Progress Notes (Signed)
[facility] [place]   Reason for visit: Follow up on bacteremia, osteomyelitis  Interval History: TEE negative for vegetation; WBC wnl, remains afebrile Remains on vancomycin   Physical Exam: Constitutional:  Vitals:   04/26/21 1151 04/26/21 1236  BP: (!) 120/57 (!) 132/58  Pulse: (!) 56 (!) 54  Resp: 18 17  Temp:  98.9 F (37.2 C)  SpO2: 97% 100%   patient appears in NAD Respiratory: Normal respiratory effort; CTA B Cardiovascular: RRR GI: soft, nt, nd  Review of Systems: Constitutional: negative for fevers and chills Gastrointestinal: negative for nausea and diarrhea  Lab Results  Component Value Date   WBC 8.8 04/26/2021   HGB 8.0 (L) 04/26/2021   HCT 25.8 (L) 04/26/2021   MCV 89.0 04/26/2021   PLT 650 (H) 04/26/2021    Lab Results  Component Value Date   CREATININE 0.78 04/25/2021   BUN 11 04/25/2021   NA 136 04/25/2021   K 3.7 04/25/2021   CL 105 04/25/2021   CO2 22 04/25/2021    Lab Results  Component Value Date   ALT 13 04/22/2021   AST 15 04/22/2021   ALKPHOS 87 04/22/2021     Microbiology: Recent Results (from the past 240 hour(s))  Blood culture (routine x 2)     Status: Abnormal   Collection Time: 04/22/21  5:33 PM   Specimen: BLOOD  Result Value Ref Range Status   Specimen Description BLOOD SITE NOT SPECIFIED  Final   Special Requests   Final    BOTTLES DRAWN AEROBIC AND ANAEROBIC Blood Culture adequate volume   Culture  Setup Time   Final    GRAM POSITIVE COCCI IN CLUSTERS IN BOTH AEROBIC AND ANAEROBIC BOTTLES CRITICAL RESULT CALLED TO, READ BACK BY AND VERIFIED WITH: [place] F. [id number] FCP Performed at [facility], [address], [place], [place] [address]    Culture METHICILLIN RESISTANT STAPHYLOCOCCUS AUREUS (A)  Final   Report Status 04/25/2021 FINAL  Final   Organism ID, Bacteria METHICILLIN RESISTANT STAPHYLOCOCCUS AUREUS  Final      Susceptibility   Methicillin resistant staphylococcus  aureus - MIC*    CIPROFLOXACIN >=8 RESISTANT Resistant     ERYTHROMYCIN >=8 RESISTANT Resistant     GENTAMICIN <=0.5 SENSITIVE Sensitive     OXACILLIN >=4 RESISTANT Resistant     TETRACYCLINE <=1 SENSITIVE Sensitive     VANCOMYCIN 1 SENSITIVE Sensitive     TRIMETH/SULFA 160 RESISTANT Resistant     CLINDAMYCIN <=0.25 SENSITIVE Sensitive     RIFAMPIN <=0.5 SENSITIVE Sensitive     Inducible Clindamycin NEGATIVE Sensitive     * METHICILLIN RESISTANT STAPHYLOCOCCUS AUREUS  Blood Culture ID Panel (Reflexed)     Status: Abnormal   Collection Time: 04/22/21  5:33 PM  Result Value Ref Range Status   Enterococcus faecalis NOT DETECTED NOT DETECTED Final   Enterococcus Faecium NOT DETECTED NOT DETECTED Final   Listeria monocytogenes NOT DETECTED NOT DETECTED Final   Staphylococcus species DETECTED (A) NOT DETECTED Final    Comment: CRITICAL RESULT CALLED TO, READ BACK BY AND VERIFIED WITH: PHARMD JEREMY F. [id number] FCP    Staphylococcus aureus (BCID) DETECTED (A) NOT DETECTED Final    Comment: Methicillin (oxacillin)-resistant Staphylococcus aureus (MRSA). MRSA is predictably resistant to beta-lactam antibiotics (except ceftaroline). Preferred therapy is vancomycin unless clinically contraindicated. Patient requires contact precautions if  hospitalized. CRITICAL RESULT CALLED TO, READ BACK BY AND VERIFIED WITH: Diamond F. [id number] FCP  Staphylococcus epidermidis NOT DETECTED NOT DETECTED Final   Staphylococcus lugdunensis NOT DETECTED NOT DETECTED Final   Streptococcus species NOT DETECTED NOT DETECTED Final   Streptococcus agalactiae NOT DETECTED NOT DETECTED Final   Streptococcus pneumoniae NOT DETECTED NOT DETECTED Final   Streptococcus pyogenes NOT DETECTED NOT DETECTED Final   A.calcoaceticus-baumannii NOT DETECTED NOT DETECTED Final   Bacteroides fragilis NOT DETECTED NOT DETECTED Final   Enterobacterales NOT DETECTED NOT DETECTED Final   Enterobacter cloacae complex NOT  DETECTED NOT DETECTED Final   Escherichia coli NOT DETECTED NOT DETECTED Final   Klebsiella aerogenes NOT DETECTED NOT DETECTED Final   Klebsiella oxytoca NOT DETECTED NOT DETECTED Final   Klebsiella pneumoniae NOT DETECTED NOT DETECTED Final   Proteus species NOT DETECTED NOT DETECTED Final   Salmonella species NOT DETECTED NOT DETECTED Final   Serratia marcescens NOT DETECTED NOT DETECTED Final   Haemophilus influenzae NOT DETECTED NOT DETECTED Final   Neisseria meningitidis NOT DETECTED NOT DETECTED Final   Pseudomonas aeruginosa NOT DETECTED NOT DETECTED Final   Stenotrophomonas maltophilia NOT DETECTED NOT DETECTED Final   Candida albicans NOT DETECTED NOT DETECTED Final   Candida auris NOT DETECTED NOT DETECTED Final   Candida glabrata NOT DETECTED NOT DETECTED Final   Candida krusei NOT DETECTED NOT DETECTED Final   Candida parapsilosis NOT DETECTED NOT DETECTED Final   Candida tropicalis NOT DETECTED NOT DETECTED Final   Cryptococcus neoformans/gattii NOT DETECTED NOT DETECTED Final   Meth resistant mecA/C and MREJ DETECTED (A) NOT DETECTED Final    Comment: CRITICAL RESULT CALLED TO, READ BACK BY AND VERIFIED WITH: PHARMD JEREMY F. [id number] FCP Performed at [facility], [address], [place], [place] [address]   Resp Panel by RT-PCR (Flu A&B, Covid) Nasopharyngeal Swab     Status: None   Collection Time: 04/23/21  9:08 AM   Specimen: Nasopharyngeal Swab; Nasopharyngeal(NP) swabs in vial transport medium  Result Value Ref Range Status   SARS Coronavirus 2 by RT PCR NEGATIVE NEGATIVE Final    Comment: (NOTE) SARS-CoV-2 target nucleic acids are NOT DETECTED.  The SARS-CoV-2 RNA is generally detectable in upper respiratory specimens during the acute phase of infection. The lowest concentration of SARS-CoV-2 viral copies this assay can detect is 138 copies/mL. A negative result does not preclude SARS-Cov-2 infection and should not be used as the sole basis for  treatment or other patient management decisions. A negative result may occur with  improper specimen collection/handling, submission of specimen other than nasopharyngeal swab, presence of viral mutation(s) within the areas targeted by this assay, and inadequate number of viral copies(<138 copies/mL). A negative result must be combined with clinical observations, patient history, and epidemiological information. The expected result is Negative.  Fact Sheet for Patients:  EntrepreneurPulse.com.au  Fact Sheet for Healthcare Providers:  IncredibleEmployment.be  This test is no t yet approved or cleared by the [place] FDA and  has been authorized for detection and/or diagnosis of SARS-CoV-2 by FDA under an Emergency Use Authorization (EUA). This EUA will remain  in effect (meaning this test can be used) for the duration of the COVID-19 declaration under Section 564(b)(1) of the Act, 21 U.S.C.section 360bbb-3(b)(1), unless the authorization is terminated  or revoked sooner.       Influenza A by PCR NEGATIVE NEGATIVE Final   Influenza B by PCR NEGATIVE NEGATIVE Final    Comment: (NOTE) The Xpert Xpress SARS-CoV-2/FLU/RSV plus assay is intended as an aid in the diagnosis of influenza  from Nasopharyngeal swab specimens and should not be used as a sole basis for treatment. Nasal washings and aspirates are unacceptable for Xpert Xpress SARS-CoV-2/FLU/RSV testing.  Fact Sheet for Patients: EntrepreneurPulse.com.au  Fact Sheet for Healthcare Providers: IncredibleEmployment.be  This test is not yet approved or cleared by the [place] FDA and has been authorized for detection and/or diagnosis of SARS-CoV-2 by FDA under an Emergency Use Authorization (EUA). This EUA will remain in effect (meaning this test can be used) for the duration of the COVID-19 declaration under Section 564(b)(1) of the Act, 21  U.S.C. section 360bbb-3(b)(1), unless the authorization is terminated or revoked.  Performed at [facility], [place] [address] [place], [place] [address]   Culture, blood (routine x 2)     Status: None (Preliminary result)   Collection Time: 04/24/21  3:18 PM   Specimen: BLOOD  Result Value Ref Range Status   Specimen Description BLOOD RIGHT ANTECUBITAL  Final   Special Requests   Final    BOTTLES DRAWN AEROBIC AND ANAEROBIC Blood Culture adequate volume   Culture   Final    NO GROWTH 2 DAYS Performed at [facility], [place] [address], [place], [place] [address]    Report Status PENDING  Incomplete  Culture, blood (routine x 2)     Status: None (Preliminary result)   Collection Time: 04/24/21  3:37 PM   Specimen: BLOOD  Result Value Ref Range Status   Specimen Description BLOOD RIGHT ANTECUBITAL  Final   Special Requests   Final    BOTTLES DRAWN AEROBIC AND ANAEROBIC Blood Culture results may not be optimal due to an inadequate volume of blood received in culture bottles   Culture   Final    NO GROWTH 2 DAYS Performed at [facility], [place] [address], [place], [place] [address]    Report Status PENDING  Incomplete  Surgical PCR screen     Status: Abnormal   Collection Time: 04/24/21  3:51 PM   Specimen: Nasal Mucosa; Nasal Swab  Result Value Ref Range Status   MRSA, PCR POSITIVE (A) NEGATIVE Final    Comment: RESULT CALLED TO, READ BACK BY AND VERIFIED WITH: RN M. MARSH [phone] '@2010'  FH    Staphylococcus aureus POSITIVE (A) NEGATIVE Final    Comment: (NOTE) The Xpert SA Assay (FDA approved for NASAL specimens in patients 96 years of age and older), is one component of a comprehensive surveillance program. It is not intended to diagnose infection nor to guide or monitor treatment. Performed at [facility], [place] [address] [place], [place] [address]   Aerobic/Anaerobic Culture w Gram Stain (surgical/deep wound)     Status: None (Preliminary result)    Collection Time: 04/24/21 10:32 PM   Specimen: Wound; Abscess  Result Value Ref Range Status   Specimen Description WOUND  Final   Special Requests LEFT ANKLE  Final   Gram Stain   Final    MODERATE WBC PRESENT, PREDOMINANTLY PMN FEW GRAM POSITIVE COCCI IN CLUSTERS    Culture   Final    TOO YOUNG TO READ Performed at [facility], [place] [address], [place],  [address]    Report Status PENDING  Incomplete    Impression/Plan:  1. MRSA bacteremia - TEE negative and repeat blood cultures remain ngtd.   At this point, will have her continue with IV vancomycin for 2 weeks through her follow up appointment with me on 2/16 to assure clearance.  2.  Access - ok for picc line tomorrow if her repeat blood cultures remain negative.   3. Osteomyelitis - s/p debridement by podiatry and gram stain with GPC in clusters.  MRSA in blood as in #1.  Plan for 2 weeks of IV antibiotics as above and will transition her to oral doxycycline for 2-4 more weeks at that time.    Diagnosis: Osteomyelitis and MRSA bacteremia  Culture Result: MRSA  Allergies  Allergen Reactions   Strawberry Extract Itching, Swelling and Anaphylaxis    Mouth swells and gets itchy    OPAT Orders Discharge antibiotics to be given via PICC line Discharge antibiotics: vancomycin Per pharmacy protocol yes Aim for Vancomycin trough 15-20 or AUC 400-550 (unless otherwise indicated) Duration: 2 weeks End Date: 05/09/21  [facility] Care Per Protocol: yes  Home health RN for IV administration and teaching; PICC line care and labs.    Labs weekly while on IV antibiotics: _x_ CBC with differential __ BMP _x_ CMP _x_ CRP _x_ ESR _x_ Vancomycin trough __ CK  __x Please pull PIC at completion of IV antibiotics __ Please leave PIC in place until doctor has seen patient or been notified  Fax weekly labs to [phone]  Clinic Follow Up Appt: 05/09/21 with Dr. Linus Salmons

## 2021-04-26 NOTE — Anesthesia Postprocedure Evaluation (Signed)
Anesthesia Post Note  Patient: Tammy Good  Procedure(s) Performed: TRANSESOPHAGEAL ECHOCARDIOGRAM (TEE)     Patient location during evaluation: PACU Anesthesia Type: MAC Level of consciousness: awake and alert Pain management: pain level controlled Vital Signs Assessment: post-procedure vital signs reviewed and stable Respiratory status: spontaneous breathing and respiratory function stable Cardiovascular status: stable Postop Assessment: no apparent nausea or vomiting Anesthetic complications: no   No notable events documented.  Last Vitals:  Vitals:   04/26/21 1141 04/26/21 1151  BP: (!) 126/41 (!) 120/57  Pulse: 63 (!) 56  Resp: 19 18  Temp:    SpO2: 97% 97%    Last Pain:  Vitals:   04/26/21 1151  TempSrc:   PainSc: 0-No pain                 Marlie Kuennen DANIEL

## 2021-04-26 NOTE — Progress Notes (Signed)
Pt. Is off floor for echo procedure.  Makail Watling A. Rhyli Depaula, PT, [place] Office: [phone]

## 2021-04-26 NOTE — Progress Notes (Signed)
PROGRESS NOTE    Tammy Good  YFV:[id number] DOB: 1950-06-15 DOA: 04/22/2021 PCP: [name], MD   Brief Narrative:  Tammy Good is a 71 y.o. female with medical history significant of hyperlipidemia, CAD s/p PCI, DM type II, PAD, panhypopituitarism, and osteomyelitis s/p amputation of the left fifth toe who presented for left foot pain and swelling.  She had been initially seen in Dr. Jess Barters office on 1/9 for left foot and leg pain after falling a couple weeks prior.  At that time she states x-rays were done of her leg and she was prescribed compression socks which she wore approximately 2 days, but thereafter noticed a blister form on the lateral aspect of her foot.  She called his office and was told to use silver sulfadiazine cream on the wound and to keep it wrapped.  However, since that time she reports that she has had worsening pain and swelling in the foot with redness present.  Bearing weight on the affected leg worsens pain.  She had a procedure done with [name] of vascular surgery on 1/26.  Following the procedure and there was report of some episodes of nausea and vomiting, but patient reports that has since resolved.  She then followed up with orthopedics in their office on 1/27.  She kept thinking that her foot was infected and told her providers doing the same appointments, but had not been placed on antibiotics for treatment. She followed up with Dr. Earleen [place] at the podiatry clinic  due to her concerns of infection foot, and had been sent to the ED for further evaluation.   Upon arrival to ED, she was hemodynamically stable, x-rays noted diffuse soft tissue swelling of the left foot without radiographic evidence of osteomyelitis.  Dr. Cannon Kettle of podiatry consulted.  Patient was given 1 L normal saline IV fluids, vancomycin, and Zosyn.  MRI of the foot was ordered which was suspicion for osteomyelitis.  Patient then developed MRSA bacteremia.  ID was consulted.     Assessment & Plan:   Principal Problem:   MRSA bacteremia Active Problems:   Panhypopituitarism ([place])   Hyperlipidemia associated with type 2 diabetes mellitus (HCC)   CAD S/P percutaneous coronary angioplasty   Essential hypertension   Obesity (BMI 30-39.9)   PAD (peripheral artery disease) (HCC)   Cellulitis   Thrombocytosis   Sepsis (HCC)   Tenosynovitis of left ankle   Protein-calorie malnutrition, severe   Sepsis secondary to diabetic foot infection/cellulitis of the left foot/tenosynovitis/possible early osteomyelitis of distal fibula/MRSA bacteremia: Patient meets sepsis criteria based on tachypnea and leukocytosis.  Patient presents for a nonhealing wound and pain on the lateral aspect of the left foot since the beginning of this month.  Patient had initially been started on empiric antibiotics of vancomycin, metronidazole, and cefepime however blood culture showed MRSA, she was transitioned to vancomycin per ID recommendations.  ID formally consulted.MRI of the left foot and ankle-moderate tenosynovitis appreciated with signs of early osteomyelitis.  Seen by podiatry.  Underwent Left ankle incision and drainage with debridement at peroneal tendons and biopsy of fibula.  Transthoracic echo could not rule out vegetation.  45% ejection fraction.  Patient is scheduled to have TEE today.  Repeat blood culture from 04/24/2021 are negative thus far.  Appreciate ID and podiatry and will defer further management to them.  Paroxysmal  atrial fibrillation on chronic anticoagulation: Patient appears to be in sinus rhythm at this time.  She is on heparin currently.  Continue Toprol-XL.  Normocytic anemia: Hemoglobin is stable.   Peripheral vascular disease  CAD -Held Plavix due to possible need of procedure and or bleeding -Continue statin   Diabetes mellitus type 2:   Home regimen includes glargine 46 units nightly with NovoLog 3-8 units before every meal with meals.  Started on  long-acting insulin 23 units since she had to be n.p.o. however her blood sugar has remained stable so I will continue this and continue SSI and adjust insulin as needed.   Panhypopituitarism  -Continue levothyroxine and Decadron  Essential hypertension: Home blood pressure regimen includes losartan 25 mg every morning, metoprolol succinate 25 mg twice daily, and spironolactone 25 mg daily.  Blood pressure controlled on home regimen.  Thrombocytosis: Acute.  Platelet count elevated at 831.  Suspect secondary to above.  Improving.  Monitor   Hyperlipidemia: Associated with diabetes. -Continue atorvastatin   Hypoalbuminemia: Albumin noted to be 2.2. -Check prealbumin   GERD  -Continue Protonix   Obesity: BMI 34.61 kg/m2  DVT prophylaxis:    Code Status: Full Code  Family Communication:  None present at bedside.  Plan of care discussed with patient in length and he/she verbalized understanding and agreed with it.  Status is: Inpatient Remains inpatient appropriate because: Needs surgical intervention for diabetic foot/osteomyelitis  Planned Discharge Destination: Home with Home Health  Estimated body mass index is 33.73 kg/m as calculated from the following:   Height as of this encounter: 5\' 7"  (1.702 m).   Weight as of this encounter: 97.7 kg.    Nutritional Assessment: Body mass index is 33.73 kg/m.[name] Seen by dietician.  I agree with the assessment and plan as outlined below: Nutrition Status: Nutrition Problem: Severe Malnutrition Etiology: acute illness (DM foot infection/cellulitis) Skin Assessment: I have examined the patient's skin and I agree with the wound assessment as performed by the wound care RN as outlined below:    Consultants:  Podiatry ID Procedures:  As above   Antimicrobials:  Anti-infectives (From admission, onward)    Start     Dose/Rate Route Frequency Ordered Stop   04/24/21 1100  vancomycin (VANCOCIN) IVPB 1000 mg/200 mL premix  Status:   Discontinued        1,000 mg 200 mL/hr over 60 Minutes Intravenous Every 24 hours 04/23/21 0908 04/24/21 0819   04/24/21 1100  [MAR Hold]  vancomycin (VANCOREADY) IVPB 1250 mg/250 mL        (MAR Hold since Fri 04/26/2021 at 1031.Hold Reason: Transfer to a Procedural area)   1,250 mg 166.7 mL/hr over 90 Minutes Intravenous Every 24 hours 04/24/21 0819     04/23/21 1800  ceFEPIme (MAXIPIME) 2 g in sodium chloride 0.9 % 100 mL IVPB  Status:  Discontinued        2 g 200 mL/hr over 30 Minutes Intravenous Every 12 hours 04/23/21 1117 04/23/21 1410   04/23/21 1115  metroNIDAZOLE (FLAGYL) tablet 500 mg  Status:  Discontinued        500 mg Oral Every 12 hours 04/23/21 1101 04/23/21 1410   04/23/21 0915  piperacillin-tazobactam (ZOSYN) IVPB 3.375 g        3.375 g 100 mL/hr over 30 Minutes Intravenous  Once 04/23/21 0903 04/23/21 1014   04/23/21 0915  vancomycin (VANCOREADY) IVPB 1500 mg/300 mL        1,500 mg 150 mL/hr over 120 Minutes Intravenous  Once 04/23/21 0908 04/23/21 1356         Subjective: Patient seen and examined.  She is doing well with  no complaints  Objective: Vitals:   04/25/21 1523 04/25/21 2038 04/26/21 0828 04/26/21 0904  BP: (!) 115/45 (!) 122/54 (!) 129/53   Pulse: 68 67 (!) 53 (!) 55  Resp: 17 16 16 15   Temp: 98.2 F (36.8 C) 98.4 F (36.9 C) (!) 97.4 F (36.3 C) 98.4 F (36.9 C)  TempSrc: Oral Oral Oral Oral  SpO2: 100% 100% 100%   Weight:      Height:       No intake or output data in the 24 hours ending 04/26/21 1034  Filed Weights   04/24/21 1222  Weight: 97.7 kg    Examination:  General exam: Appears calm and comfortable  Respiratory system: Clear to auscultation. Respiratory effort normal. Cardiovascular system: S1 & S2 heard, RRR. No JVD, murmurs, rubs, gallops or clicks. No pedal edema. Gastrointestinal system: Abdomen is nondistended, soft and nontender. No organomegaly or masses felt. Normal bowel sounds heard. Central nervous system: Alert  and oriented. No focal neurological deficits. Extremities: Dressing in the left ankle Psychiatry: Judgement and insight appear normal. Mood & affect appropriate.    Data Reviewed: I have personally reviewed following labs and imaging studies  CBC: Recent Labs  Lab 04/22/21 1739 04/23/21 1241 04/23/21 1530 04/24/21 0449 04/25/21 0220 04/26/21 0206  WBC 13.7*  --   --  9.7 9.1 8.8  NEUTROABS 12.2*  --   --   --   --   --   HGB 9.5* 8.4* 11.4* 7.9* 7.9* 8.0*  HCT 30.9* 28.0* 37.0 25.6* 26.1* 25.8*  MCV 89.3  --   --  88.6 87.9 89.0  PLT 831*  --   --  630* 643* 650*    Basic Metabolic Panel: Recent Labs  Lab 04/22/21 1739 04/24/21 0449 04/25/21 0220  NA 133* 135 136  K 4.2 4.1 3.7  CL 100 107 105  CO2 23 21* 22  GLUCOSE 185* 144* 187*  BUN 13 13 11   CREATININE 1.13* 0.92 0.78  CALCIUM 8.6* 8.1* 8.1*    GFR: Estimated Creatinine Clearance: 80.8 mL/min (by C-G formula based on SCr of 0.78 mg/dL). Liver Function Tests: Recent Labs  Lab 04/22/21 1739  AST 15  ALT 13  ALKPHOS 87  BILITOT 0.6  PROT 7.9  ALBUMIN 2.2*    No results for input(s): LIPASE, AMYLASE in the last 168 hours. No results for input(s): AMMONIA in the last 168 hours. Coagulation Profile: No results for input(s): INR, PROTIME in the last 168 hours. Cardiac Enzymes: No results for input(s): CKTOTAL, CKMB, CKMBINDEX, TROPONINI in the last 168 hours. BNP (last 3 results) No results for input(s): PROBNP in the last 8760 hours. HbA1C: Recent Labs    04/23/21 1241  HGBA1C 7.2*    CBG: Recent Labs  Lab 04/25/21 0802 04/25/21 1136 04/25/21 1601 04/25/21 2042 04/26/21 0647  GLUCAP 140* 161* 170* 230* 174*    Lipid Profile: No results for input(s): CHOL, HDL, LDLCALC, TRIG, CHOLHDL, LDLDIRECT in the last 72 hours. Thyroid Function Tests: No results for input(s): TSH, T4TOTAL, FREET4, T3FREE, THYROIDAB in the last 72 hours. Anemia Panel: No results for input(s): VITAMINB12, FOLATE,  FERRITIN, TIBC, IRON, RETICCTPCT in the last 72 hours. Sepsis Labs: Recent Labs  Lab 04/22/21 1739  LATICACIDVEN 1.6     Recent Results (from the past 240 hour(s))  Blood culture (routine x 2)     Status: Abnormal   Collection Time: 04/22/21  5:33 PM   Specimen: BLOOD  Result Value Ref Range Status  Specimen Description BLOOD SITE NOT SPECIFIED  Final   Special Requests   Final    BOTTLES DRAWN AEROBIC AND ANAEROBIC Blood Culture adequate volume   Culture  Setup Time   Final    GRAM POSITIVE COCCI IN CLUSTERS IN BOTH AEROBIC AND ANAEROBIC BOTTLES CRITICAL RESULT CALLED TO, READ BACK BY AND VERIFIED WITH: [place] [id number] FCP Performed at [facility], [address] [place], [place] [address]    Culture METHICILLIN RESISTANT STAPHYLOCOCCUS AUREUS (A)  Final   Report Status 04/25/2021 FINAL  Final   Organism ID, Bacteria METHICILLIN RESISTANT STAPHYLOCOCCUS AUREUS  Final      Susceptibility   Methicillin resistant staphylococcus aureus - MIC*    CIPROFLOXACIN >=8 RESISTANT Resistant     ERYTHROMYCIN >=8 RESISTANT Resistant     GENTAMICIN <=0.5 SENSITIVE Sensitive     OXACILLIN >=4 RESISTANT Resistant     TETRACYCLINE <=1 SENSITIVE Sensitive     VANCOMYCIN 1 SENSITIVE Sensitive     TRIMETH/SULFA 160 RESISTANT Resistant     CLINDAMYCIN <=0.25 SENSITIVE Sensitive     RIFAMPIN <=0.5 SENSITIVE Sensitive     Inducible Clindamycin NEGATIVE Sensitive     * METHICILLIN RESISTANT STAPHYLOCOCCUS AUREUS  Blood Culture ID Panel (Reflexed)     Status: Abnormal   Collection Time: 04/22/21  5:33 PM  Result Value Ref Range Status   Enterococcus faecalis NOT DETECTED NOT DETECTED Final   Enterococcus Faecium NOT DETECTED NOT DETECTED Final   Listeria monocytogenes NOT DETECTED NOT DETECTED Final   Staphylococcus species DETECTED (A) NOT DETECTED Final    Comment: CRITICAL RESULT CALLED TO, READ BACK BY AND VERIFIED WITH: PHARMD JEREMY F. [id number] FCP     Staphylococcus aureus (BCID) DETECTED (A) NOT DETECTED Final    Comment: Methicillin (oxacillin)-resistant Staphylococcus aureus (MRSA). MRSA is predictably resistant to beta-lactam antibiotics (except ceftaroline). Preferred therapy is vancomycin unless clinically contraindicated. Patient requires contact precautions if  hospitalized. CRITICAL RESULT CALLED TO, READ BACK BY AND VERIFIED WITH: PHARMD JEREMY F. [phone] FCP    Staphylococcus epidermidis NOT DETECTED NOT DETECTED Final   Staphylococcus lugdunensis NOT DETECTED NOT DETECTED Final   Streptococcus species NOT DETECTED NOT DETECTED Final   Streptococcus agalactiae NOT DETECTED NOT DETECTED Final   Streptococcus pneumoniae NOT DETECTED NOT DETECTED Final   Streptococcus pyogenes NOT DETECTED NOT DETECTED Final   A.calcoaceticus-baumannii NOT DETECTED NOT DETECTED Final   Bacteroides fragilis NOT DETECTED NOT DETECTED Final   Enterobacterales NOT DETECTED NOT DETECTED Final   Enterobacter cloacae complex NOT DETECTED NOT DETECTED Final   Escherichia coli NOT DETECTED NOT DETECTED Final   Klebsiella aerogenes NOT DETECTED NOT DETECTED Final   Klebsiella oxytoca NOT DETECTED NOT DETECTED Final   Klebsiella pneumoniae NOT DETECTED NOT DETECTED Final   Proteus species NOT DETECTED NOT DETECTED Final   Salmonella species NOT DETECTED NOT DETECTED Final   Serratia marcescens NOT DETECTED NOT DETECTED Final   Haemophilus influenzae NOT DETECTED NOT DETECTED Final   Neisseria meningitidis NOT DETECTED NOT DETECTED Final   Pseudomonas aeruginosa NOT DETECTED NOT DETECTED Final   Stenotrophomonas maltophilia NOT DETECTED NOT DETECTED Final   Candida albicans NOT DETECTED NOT DETECTED Final   Candida auris NOT DETECTED NOT DETECTED Final   Candida glabrata NOT DETECTED NOT DETECTED Final   Candida krusei NOT DETECTED NOT DETECTED Final   Candida parapsilosis NOT DETECTED NOT DETECTED Final   Candida tropicalis NOT DETECTED NOT  DETECTED Final   Cryptococcus  neoformans/gattii NOT DETECTED NOT DETECTED Final   Meth resistant mecA/C and MREJ DETECTED (A) NOT DETECTED Final    Comment: CRITICAL RESULT CALLED TO, READ BACK BY AND VERIFIED WITH: Benkelman F. [id number] FCP Performed at [facility], [place] [address], [place], [place] [address]   Resp Panel by RT-PCR (Flu A&B, Covid) Nasopharyngeal Swab     Status: None   Collection Time: 04/23/21  9:08 AM   Specimen: Nasopharyngeal Swab; Nasopharyngeal(NP) swabs in vial transport medium  Result Value Ref Range Status   SARS Coronavirus 2 by RT PCR NEGATIVE NEGATIVE Final    Comment: (NOTE) SARS-CoV-2 target nucleic acids are NOT DETECTED.  The SARS-CoV-2 RNA is generally detectable in upper respiratory specimens during the acute phase of infection. The lowest concentration of SARS-CoV-2 viral copies this assay can detect is 138 copies/mL. A negative result does not preclude SARS-Cov-2 infection and should not be used as the sole basis for treatment or other patient management decisions. A negative result may occur with  improper specimen collection/handling, submission of specimen other than nasopharyngeal swab, presence of viral mutation(s) within the areas targeted by this assay, and inadequate number of viral copies(<138 copies/mL). A negative result must be combined with clinical observations, patient history, and epidemiological information. The expected result is Negative.  Fact Sheet for Patients:  EntrepreneurPulse.com.au  Fact Sheet for Healthcare Providers:  IncredibleEmployment.be  This test is no t yet approved or cleared by the [place] FDA and  has been authorized for detection and/or diagnosis of SARS-CoV-2 by FDA under an Emergency Use Authorization (EUA). This EUA will remain  in effect (meaning this test can be used) for the duration of the COVID-19 declaration under Section 564(b)(1) of the  Act, 21 U.S.C.section 360bbb-3(b)(1), unless the authorization is terminated  or revoked sooner.       Influenza A by PCR NEGATIVE NEGATIVE Final   Influenza B by PCR NEGATIVE NEGATIVE Final    Comment: (NOTE) The Xpert Xpress SARS-CoV-2/FLU/RSV plus assay is intended as an aid in the diagnosis of influenza from Nasopharyngeal swab specimens and should not be used as a sole basis for treatment. Nasal washings and aspirates are unacceptable for Xpert Xpress SARS-CoV-2/FLU/RSV testing.  Fact Sheet for Patients: EntrepreneurPulse.com.au  Fact Sheet for Healthcare Providers: IncredibleEmployment.be  This test is not yet approved or cleared by the [place] FDA and has been authorized for detection and/or diagnosis of SARS-CoV-2 by FDA under an Emergency Use Authorization (EUA). This EUA will remain in effect (meaning this test can be used) for the duration of the COVID-19 declaration under Section 564(b)(1) of the Act, 21 U.S.C. section 360bbb-3(b)(1), unless the authorization is terminated or revoked.  Performed at [facility], [place] [address], [place], [place] [address]   Culture, blood (routine x 2)     Status: None (Preliminary result)   Collection Time: 04/24/21  3:18 PM   Specimen: BLOOD  Result Value Ref Range Status   Specimen Description BLOOD RIGHT ANTECUBITAL  Final   Special Requests   Final    BOTTLES DRAWN AEROBIC AND ANAEROBIC Blood Culture adequate volume   Culture   Final    NO GROWTH 2 DAYS Performed at [facility], [place] [address], [place], [place] [address]    Report Status PENDING  Incomplete  Culture, blood (routine x 2)     Status: None (Preliminary result)   Collection Time: 04/24/21  3:37 PM   Specimen: BLOOD  Result  Value Ref Range Status   Specimen Description BLOOD RIGHT ANTECUBITAL  Final   Special Requests   Final    BOTTLES DRAWN AEROBIC AND ANAEROBIC Blood Culture results may not be  optimal due to an inadequate volume of blood received in culture bottles   Culture   Final    NO GROWTH 2 DAYS Performed at [facility] [address] [place], [place] [address]    Report Status PENDING  Incomplete  Surgical PCR screen     Status: Abnormal   Collection Time: 04/24/21  3:51 PM   Specimen: Nasal Mucosa; Nasal Swab  Result Value Ref Range Status   MRSA, PCR POSITIVE (A) NEGATIVE Final    Comment: RESULT CALLED TO, READ BACK BY AND VERIFIED WITH: RN M. MARSH [phone] @2010  FH    Staphylococcus aureus POSITIVE (A) NEGATIVE Final    Comment: (NOTE) The Xpert SA Assay (FDA approved for NASAL specimens in patients 67 years of age and older), is one component of a comprehensive surveillance program. It is not intended to diagnose infection nor to guide or monitor treatment. Performed at [facility], [place] [address], [place], [place] [address]   Aerobic/Anaerobic Culture w Gram Stain (surgical/deep wound)     Status: None (Preliminary result)   Collection Time: 04/24/21 10:32 PM   Specimen: Wound; Abscess  Result Value Ref Range Status   Specimen Description WOUND  Final   Special Requests LEFT ANKLE  Final   Gram Stain   Final    MODERATE WBC PRESENT, PREDOMINANTLY PMN FEW GRAM POSITIVE COCCI IN CLUSTERS    Culture   Final    TOO YOUNG TO READ Performed at [facility], [place] [address] [place], [place] [address]    Report Status PENDING  Incomplete      Radiology Studies: ECHOCARDIOGRAM LIMITED  Result Date: 04/24/2021    ECHOCARDIOGRAM LIMITED REPORT   Patient Name:   MAKENZI BANNISTER Date of Exam: 04/24/2021 Medical Rec #:  [id number]         Height:       67.0 in Accession #:    [id number]        Weight:       221.0 lb Date of Birth:  [birth year]          BSA:          2.110 m Patient Age:    37 years          BP:           123/51 mmHg Patient Gender: F                 HR:           70 bpm. Exam Location:  Inpatient Procedure: Limited Echo, Cardiac  Doppler and Color Doppler Indications:    Bacteremia  History:        Patient has prior history of Echocardiogram examinations, most                 recent 11/16/2020. Angina and CAD; Risk Factors:Dyslipidemia,                 Hypertension and Diabetes.  Sonographer:    Arlyss Gandy Referring Phys: [place] Comments: Patient is morbidly obese. Image acquisition challenging due to patient body habitus. IMPRESSIONS  1. Left ventricular ejection fraction, by estimation, is 40 to 45%. The left ventricle has mildly decreased function. There is  mild concentric left ventricular hypertrophy. Left ventricular diastolic parameters are indeterminate.  2. The mitral valve was not well visualized. No evidence of mitral valve regurgitation.  3. The aortic valve is tricuspid. Conclusion(s)/Recommendation(s): Cannot exclude vegetation due to poor visibility of the valves. Consider transesophageal echocardiogram, if clinically indicated. FINDINGS  Left Ventricle: Left ventricular ejection fraction, by estimation, is 40 to 45%. The left ventricle has mildly decreased function. There is mild concentric left ventricular hypertrophy. Left ventricular diastolic parameters are indeterminate. Right Ventricle: Right vetricular wall thickness was not well visualized. Mitral Valve: The mitral valve was not well visualized. Tricuspid Valve: The tricuspid valve is not well visualized. Tricuspid valve regurgitation is trivial. Aortic Valve: The aortic valve is tricuspid. Pulmonic Valve: The pulmonic valve was not well visualized. LEFT VENTRICLE PLAX 2D LVIDd:         3.50 cm LVIDs:         2.60 cm LV PW:         1.20 cm LV IVS:        1.20 cm  TRICUSPID VALVE TR Peak grad:   22.1 mmHg TR Vmax:        235.00 cm/s Kardie Tobb DO Electronically signed by Berniece Salines DO Signature Date/Time: 04/24/2021/3:58:18 PM    Final     Scheduled Meds:  [MAR Hold] atorvastatin  40 mg Oral q1800   [MAR Hold] Chlorhexidine Gluconate  Cloth  6 each Topical Q0600   [MAR Hold] dexamethasone  0.5 mg Oral q morning   [MAR Hold] feeding supplement  237 mL Oral BID BM   [MAR Hold] insulin aspart  0-15 Units Subcutaneous TID WC   [MAR Hold] insulin glargine-yfgn  23 Units Subcutaneous QHS   [MAR Hold] levothyroxine  125 mcg Oral QAC breakfast   [MAR Hold] losartan  25 mg Oral q morning   [MAR Hold] metoprolol succinate  25 mg Oral BID   [MAR Hold] multivitamin with minerals  1 tablet Oral Daily   [MAR Hold] mupirocin ointment  1 application Nasal BID   [MAR Hold] mupirocin ointment  1 application Nasal BID   [MAR Hold] pantoprazole  40 mg Oral BID AC   [MAR Hold] sodium chloride flush  3 mL Intravenous Q12H   [MAR Hold] spironolactone  25 mg Oral q morning   Continuous Infusions:  heparin 1,450 Units/hr (04/26/21 0249)   [MAR Hold] vancomycin 1,250 mg (04/25/21 1156)     LOS: 3 days   Time spent: 26 minutes  Darliss Cheney, MD [facility]  04/26/2021, 10:34 AM  Please page via [place] [place] and do not message via secure chat for urgent patient care matters. Secure chat can be used for non urgent patient care matters.  How to contact the [facility] Attending or Consulting provider [place] or covering provider during after hours [place], for this patient?  Check the care team in [facility] and look for a) attending/consulting [facility] provider listed and b) the [facility] team listed. Page or secure chat [phone]. Log into [email or web address] and use [place]'s universal password to access. If you do not have the password, please contact the hospital operator. Locate the [facility] provider you are looking for under [facility] and page to a number that you can be directly reached. If you still have difficulty reaching the provider, please page the [facility] (Director on Call) for the Hospitalists listed on amion for assistance.

## 2021-04-27 ENCOUNTER — Inpatient Hospital Stay: Payer: Self-pay

## 2021-04-27 LAB — GLUCOSE, CAPILLARY
Glucose-Capillary: 127 mg/dL — ABNORMAL HIGH (ref 70–99)
Glucose-Capillary: 148 mg/dL — ABNORMAL HIGH (ref 70–99)

## 2021-04-27 LAB — HEPARIN LEVEL (UNFRACTIONATED): Heparin Unfractionated: 0.67 IU/mL (ref 0.30–0.70)

## 2021-04-27 LAB — CBC
HCT: 26.9 % — ABNORMAL LOW (ref 36.0–46.0)
Hemoglobin: 8.1 g/dL — ABNORMAL LOW (ref 12.0–15.0)
MCH: 27 pg (ref 26.0–34.0)
MCHC: 30.1 g/dL (ref 30.0–36.0)
MCV: 89.7 fL (ref 80.0–100.0)
Platelets: 689 10*3/uL — ABNORMAL HIGH (ref 150–400)
RBC: 3 MIL/uL — ABNORMAL LOW (ref 3.87–5.11)
RDW: 18 % — ABNORMAL HIGH (ref 11.5–15.5)
WBC: 10.1 10*3/uL (ref 4.0–10.5)
nRBC: 0 % (ref 0.0–0.2)

## 2021-04-27 LAB — VANCOMYCIN, RANDOM: Vancomycin Rm: 15

## 2021-04-27 MED ORDER — BISACODYL 10 MG RE SUPP
10.0000 mg | Freq: Once | RECTAL | Status: AC
Start: 2021-04-27 — End: 2021-04-27
  Administered 2021-04-27: 10 mg via RECTAL
  Filled 2021-04-27: qty 1

## 2021-04-27 MED ORDER — SODIUM CHLORIDE 0.9% FLUSH: 10.0000 mL | INTRAVENOUS | Status: DC | PRN

## 2021-04-27 MED ORDER — VANCOMYCIN IV (FOR PTA / DISCHARGE USE ONLY): 750.0000 mg | Freq: Two times a day (BID) | INTRAVENOUS | 0 refills | Status: AC

## 2021-04-27 MED ORDER — VANCOMYCIN HCL 750 MG/150ML IV SOLN
750.0000 mg | Freq: Two times a day (BID) | INTRAVENOUS | Status: DC
  Administered 2021-04-27: 750 mg via INTRAVENOUS
  Filled 2021-04-27: qty 150

## 2021-04-27 MED ORDER — SODIUM CHLORIDE 0.9% FLUSH: 10.0000 mL | Freq: Two times a day (BID) | INTRAVENOUS | Status: DC

## 2021-04-27 NOTE — Plan of Care (Signed)
  Problem: Education: Goal: Knowledge of General Education information will improve Description: Including pain rating scale, medication(s)/side effects and non-pharmacologic comfort measures Outcome: Progressing   Problem: Activity: Goal: Risk for activity intolerance will decrease Outcome: Progressing   Problem: Nutrition: Goal: Adequate nutrition will be maintained Outcome: Progressing   

## 2021-04-27 NOTE — Progress Notes (Signed)
PT Cancellation Note  Patient Details Name: Tammy Good MRN: [id number] DOB: 03-22-1951   Cancelled Treatment:    Reason Eval/Treat Not Completed: Other (comment) (Pt is being prepped to D/C home at this time; no PT rendered.)  Elizabeth Paulsen A. Joelyn Lover, PT, DPT Acute Rehabilitation Services Office: [place] 04/27/2021, 3:32 PM

## 2021-04-27 NOTE — Progress Notes (Signed)
Pt educated on AVS with daughter at [facility].  Vanco IV Rx given to pt as well.  Single lumen Picc line in place and dressing DDI for Home health IV administrations starting tomorrow. Daughter to take pt home.

## 2021-04-27 NOTE — TOC Progression Note (Signed)
Transition of Care [facility]) - Progression Note    Patient Details  Name: Tammy Good MRN: [id number] Date of Birth: [birth year]  Transition of Care [facility]) CM/SW Contact  Bartholomew Crews, RN Phone Number: [phone] 04/27/2021, 10:29 AM  Clinical Narrative:     Notified by MD of patient medically ready to transition home. Noted referrals placed by previous NCM for [facility] and home infusion. Spoke with Pam with [facility] - patient can transition home today after today's dose of vancomycin. Spoke with Tommi Rumps at [facility] - start of care tomorrow for first home dose of IV antibiotic and home teaching for IV antibiotics.   Spoke with patient briefly on hospital room phone. Patient waiting on assistance for patient care, and requested call back.   TOC following for transition needs.   Expected Discharge Plan: [place] Barriers to Discharge: Continued Medical Work up  Expected Discharge Plan and Services Expected Discharge Plan: [place]   Discharge Planning Services: CM Consult   Living arrangements for the past 2 months: Single Family Home Expected Discharge Date: 04/27/21               DME Arranged: Other see comment (IV ABX Therapy/ [facility])   Date DME Agency Contacted: 04/26/21 Time DME Agency Contacted: [phone] Representative spoke with at DME Agency: [place]: RN [place] Agency: [place]   Time [place]: [phone] Representative spoke with at [place]: [place] ([place]) Interventions    Readmission Risk Interventions Readmission Risk Prevention Plan 05/30/2019 05/27/2019  Transportation Screening Complete Complete  PCP or Specialist Appt within 5-7 Days Complete Complete  Home Care Screening Complete Complete  Medication Review (RN CM) Complete Complete  Some recent data might be hidden

## 2021-04-27 NOTE — Progress Notes (Addendum)
Pharmacy Antibiotic Note/heparin  Tammy Good is a 71 y.o. female admitted on 04/22/2021 presenting with LLE, cellulitis.  Pharmacy has been consulted for vancomycin dosing.   Pt is on D5 of abx. Plan for 2 wks of vanc per ID for MRSA bacteremia with end date of 2/16.  Vanc peak 38, vanc random = 15 >>AUC 352  Heparin level therapeutic 0.67  Plan: Change vanc to 750mg  IV q12>>AUC 422 Check level next week if dc home today Continue heparin 1250 units/hr  Height: 5\' 7"  (170.2 cm) Weight: 97.7 kg (215 lb 6.2 oz) IBW/kg (Calculated) : 61.6  Temp (24hrs), Avg:98.1 F (36.7 C), Min:97.2 F (36.2 C), Max:98.9 F (37.2 C)  Recent Labs  Lab 04/22/21 1739 04/24/21 0449 04/25/21 0220 04/26/21 0206 04/26/21 2124 04/27/21 0626  WBC 13.7* 9.7 9.1 8.8  --  10.1  CREATININE 1.13* 0.92 0.78  --   --   --   LATICACIDVEN 1.6  --   --   --   --   --   VANCOPEAK  --   --   --   --  38  --   VANCORANDOM  --   --   --   --   --  15     Estimated Creatinine Clearance: 80.8 mL/min (by C-G formula based on SCr of 0.78 mg/dL).    Allergies  Allergen Reactions   Strawberry Extract Itching, Swelling and Anaphylaxis    Mouth swells and gets itchy   1/31 vancomycin >>2/16 1/31 cefepime x1 1/31 metronidazole x1 1/31 Zosyn x 1  Onnie Boer, PharmD, [place], AAHIVP, CPP Infectious Disease Pharmacist 04/27/2021 9:57 AM

## 2021-04-27 NOTE — Progress Notes (Signed)
Subjective: Tammy Good is a 71 y.o. female patient seen at bedside, resting comfortably in no acute distress s/p day #3 Left ankle incision and drainage with debridement at peroneal tendons and biopsy of fibula. Patient denies current pain at surgical site because nurse recently gave her a pain pill.  Patient states that she is concerned about the cost of her antibiotics and is not sure if she wants to go home because she cannot afford $131 a day.  Patient denies any other concerns or pedal complaints at this time.  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   PAD (peripheral artery disease) ([place]) 05/26/2019   Osteomyelitis of left foot ([place]) 05/24/2019   Osteomyelitis ([place]) 05/23/2019   Cellulitis and abscess of foot, except toes 05/18/2019   Chronic osteomyelitis of ankle and foot ([place]) 05/18/2019   Cholesteatoma 02/24/2019   Cellulitis and abscess of toe 11/09/2018   Colon cancer screening 11/30/2017   Long term (current) use of insulin ([place]) 03/05/2017   Iron deficiency anemia 12/10/2014   Obesity (BMI 30-39.9) 11/17/2013   Diabetes mellitus type 2 in obese [facility])    Essential hypertension    Right thigh pain 11/01/2013   PVC's (premature ventricular contractions) 11/01/2013   Status post insertion of drug eluting coronary artery stent to [facility] emergently and PTCA to prox. 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   Non-ST elevation (NSTEMI) myocardial infarction ([place]) 08/22/2013   CAD S/P percutaneous coronary angioplasty 08/22/2013     Current Facility-Administered Medications:    atorvastatin (LIPITOR) tablet 40 mg, 40 mg, Oral, q1800, Buford Dresser, MD, 40 mg at 04/26/21 1701   bisacodyl (DULCOLAX) suppository 10 mg, 10 mg, Rectal, Once, Darliss Cheney, MD   Chlorhexidine Gluconate Cloth 2 % PADS 6 each, 6 each, Topical, Q0600, Buford Dresser, MD, 6 each at 04/27/21 9528   dexamethasone (DECADRON) tablet 0.5 mg, 0.5 mg, Oral, q morning, Buford Dresser, MD, 0.5 mg at 04/26/21 4132   feeding supplement (ENSURE ENLIVE / ENSURE PLUS) liquid 237 mL, 237 mL, Oral, BID BM, Buford Dresser, MD   heparin ADULT infusion 100 units/mL (25000 units/216mL), 1,450 Units/hr, Intravenous, Continuous, Buford Dresser, MD, Last Rate: 14.5 mL/hr at 04/26/21 2009, 1,450 Units/hr at 04/26/21 2009   HYDROcodone-acetaminophen (NORCO/VICODIN) 5-325 MG per tablet 1 tablet, 1 tablet, Oral, Q6H PRN, Buford Dresser, MD, 1 tablet at 04/27/21 0636   insulin aspart (novoLOG) injection 0-15 Units, 0-15 Units, Subcutaneous, TID WC, Buford Dresser, MD, 2 Units at 04/26/21 1634   insulin glargine-yfgn (SEMGLEE) injection 23 Units, 23 Units, Subcutaneous, QHS, Buford Dresser, MD, 23 Units at 04/26/21 2209   levothyroxine (SYNTHROID) tablet 125 mcg, 125 mcg, Oral, QAC breakfast, Buford Dresser, MD, 125 mcg at 04/27/21 0612   losartan (COZAAR) tablet 25 mg, 25 mg, Oral, q morning, Buford Dresser, MD, 25 mg at 04/26/21 1256  metoprolol succinate (TOPROL-XL) 24 hr tablet 25 mg, 25 mg, Oral, BID, Buford Dresser, MD, 25 mg at 04/26/21 2208   multivitamin with minerals tablet 1 tablet, 1 tablet, Oral, Daily, Buford Dresser, MD, 1 tablet at 04/26/21 0917   mupirocin ointment (BACTROBAN) 2 % 1 application, 1 application, Nasal, BID, Buford Dresser, MD, 1 application at 02/54/27 2208   mupirocin ointment (BACTROBAN) 2 % 1 application, 1 application, Nasal, BID, Buford Dresser, MD, 1 application at 09/14/74 2209   pantoprazole (PROTONIX) EC tablet 40 mg, 40 mg, Oral, BID AC, Buford Dresser, MD, 40 mg at 04/26/21 1633   sodium chloride flush (NS) 0.9 % injection 3 mL, 3 mL, Intravenous, Q12H, Buford Dresser, MD, 3 mL at 04/26/21 2831   spironolactone (ALDACTONE) tablet 25 mg, 25 mg, Oral, q morning, Buford Dresser, MD, 25 mg at 04/26/21 1256   vancomycin (VANCOREADY) IVPB 1250 mg/250 mL, 1,250 mg, Intravenous, Q24H, Pham, Minh Q, RPH-CPP, Last Rate: 166.7 mL/hr at 04/26/21 2015, 1,250 mg at 04/26/21 2015  Allergies  Allergen Reactions   Strawberry Extract Itching, Swelling and Anaphylaxis    Mouth swells and gets itchy     Objective: Today's Vitals   04/26/21 2253 04/27/21 0532 04/27/21 0600 04/27/21 0744  BP:  (!) 130/59  (!) 124/53  Pulse:  69  (!) 54  Resp:      Temp:  98.7 F (37.1 C)  98.4 F (36.9 C)  TempSrc:  Oral  Oral  SpO2:  99%  98%  Weight:      Height:      PainSc: 2   7      General: No acute distress  Left Lower extremity: Dressing to left foot clean, dry, intact. No strikethrough noted, upon removal of the bandage there is mild bloody drainage, sutures intact to the lateral ankle with packing in place at the inferior and superior portion of the surgical site incision, once the packing was removed there was bloody drainage no purulence expressed, decreased erythema, decreased edema, no malodor, no warmth, minimal pain to ankle, no calf pain. Range of motion excluding surgical site within normal limits with no pain or crepitation.     Assessment and Plan:  Problem List Items Addressed This Visit       Other   Cellulitis   *  (Principal) MRSA bacteremia   Other Visit Diagnoses     Left foot infection    -  Primary   Relevant Medications   vancomycin (VANCOREADY) IVPB 1500 mg/300 mL (Completed)   mupirocin ointment (BACTROBAN) 2 % 1 application   mupirocin ointment (BACTROBAN) 2 % 1 application   vancomycin (VANCOREADY) IVPB 1250 mg/250 mL      -Patient seen and evaluated at bedside -Chart reviewed -Dressing change performed. -Recommend continue with IV antibiotics cultures now revealing staph -Weightbearing as tolerated with post op shoe with assistance -Continue with rest and elevation to assist with pain and edema control  -Podiatry will continue to follow closely until discharge -Anticipated discharge plan of care: Patient is stable for discharge home pending arrangements for her antibiotics please have case manager to address cost with patient.  Patient will need home nursing of packing and dry dressing to the left ankle every other day.  After discharge patient to follow up in office within 1-2 weeks.  Office will call patient to arrange appointment after discharge.  Dr. Landis Martins On Call provider  [facility] [phone] office [phone] cell Available via secure chat

## 2021-04-27 NOTE — Progress Notes (Signed)
Peripherally Inserted Central Catheter Placement  The IV Nurse has discussed with the patient and/or persons authorized to consent for the patient, the purpose of this procedure and the potential benefits and risks involved with this procedure.  The benefits include less needle sticks, lab draws from the catheter, and the patient may be discharged home with the catheter. Risks include, but not limited to, infection, bleeding, blood clot (thrombus formation), and puncture of an artery; nerve damage and irregular heartbeat and possibility to perform a PICC exchange if needed/ordered by physician.  Alternatives to this procedure were also discussed.  Bard Power PICC patient education guide, fact sheet on infection prevention and patient information card has been provided to patient /or left at bedside.    PICC Placement Documentation  PICC Single Lumen 12/87/86 Right Basilic 43 cm 0 cm (Active)  Indication for Insertion or Continuance of Line Prolonged intravenous therapies;Home intravenous therapies (PICC only) 04/27/21 1136  Exposed Catheter (cm) 0 cm 04/27/21 1136  Site Assessment Clean;Dry;Intact 04/27/21 1136  Line Status Flushed;Saline locked;Blood return noted 04/27/21 1136  Dressing Type Transparent;Securing device 04/27/21 1136  Dressing Status Clean;Dry;Intact 04/27/21 1136  Antimicrobial disc in place? Yes 04/27/21 1136  Safety Lock Not Applicable 76/72/09 4709  Line Care Connections checked and tightened 04/27/21 1136  Line Adjustment (NICU/IV Team Only) No 04/27/21 1136  Dressing Intervention New dressing;Securement device changed;Antimicrobial disc changed 04/27/21 1136  Dressing Change Due 05/04/21 04/27/21 1136       Rolena Infante 04/27/2021, 11:50 AM

## 2021-04-27 NOTE — Progress Notes (Signed)
OPAT Order Details             .Outpatient Parenteral Antibiotic Therapy Consult  Until discontinued       Provider:  (Not yet assigned)  Question Answer Comment  Antibiotic Vancomycin IVPB   Indications for use MRSA Osteo   End Date 05/09/2021                  Onnie Boer, PharmD, BCIDP, AAHIVP, CPP Infectious Disease Pharmacist 04/27/2021 10:01 AM

## 2021-04-27 NOTE — Discharge Summary (Addendum)
PatientPhysician Discharge Summary  Tammy Good EVO:[id number] DOB: [birth year] DOA: 04/22/2021  PCP: [name], MD  Admit date: 04/22/2021 Discharge date: 04/27/2021 30 Day Unplanned Readmission Risk Score    Flowsheet Row ED to Hosp-Admission (Current) from 04/22/2021 in [place]  30 Day Unplanned Readmission Risk Score (%) 23.52 Filed at 04/27/2021 0800       This score is the patient's risk of an unplanned readmission within 30 days of being discharged (0 -100%). The score is based on dignosis, age, lab data, medications, orders, and past utilization.   Low:  0-14.9   Medium: 15-21.9   High: 22-29.9   Extreme: 30 and above          Admitted From: Home Disposition: Home  Recommendations for Outpatient Follow-up:  Follow up with PCP in 1-2 weeks Please obtain BMP/CBC in one week Follow-up with podiatry in 1 to 2 weeks Follow-up with ID in 1 to 2 weeks Please follow up with your PCP on the following pending results: Unresulted Labs (From admission, onward)     Start     Ordered   04/26/21 0500  Heparin level (unfractionated)  Daily,   R      04/25/21 0106   04/25/21 0500  CBC  Daily,   R      04/23/21 1352   04/23/21 1509  Occult blood card to lab, stool RN will collect  ONCE - STAT,   STAT       Question:  Specimen to be collected by:  Answer:  RN will collect   04/23/21 1508              Home Health: Yes Equipment/Devices: None  Discharge Condition: Stable CODE STATUS: Full code Diet recommendation: Cardiac  Subjective: Seen and examined.  She has no complaints.  She is ready to go home.  Brief/Interim Summary: Tammy Good is a 71 y.o. female with medical history significant of hyperlipidemia, CAD s/p PCI, DM type II, PAD, panhypopituitarism, and osteomyelitis s/p amputation of the left fifth toe who presented for left foot pain and swelling.  She had been initially seen in Dr. Jess Barters office on 1/9 for left  foot and leg pain after falling a couple weeks prior.  At that time she states x-rays were done of her leg and she was prescribed compression socks which she wore approximately 2 days, but thereafter noticed a blister form on the lateral aspect of her foot.  She called his office and was told to use silver sulfadiazine cream on the wound and to keep it wrapped.  However, since that time she reports that she has had worsening pain and swelling in the foot with redness present.  Bearing weight on the affected leg worsens pain.  She had a procedure done with [name] of vascular surgery on 1/26.  Following the procedure and there was report of some episodes of nausea and vomiting, but patient reports that has since resolved.  She then followed up with orthopedics in their office on 1/27.  She kept thinking that her foot was infected and told her providers doing the same appointments, but had not been placed on antibiotics for treatment. She followed up with [name] at the podiatry clinic  due to her concerns of infection foot, and had been sent to the ED for further evaluation.   Upon arrival to ED, she was hemodynamically stable, x-rays noted diffuse soft tissue swelling of the left foot without  radiographic evidence of osteomyelitis.  Dr. Cannon Kettle of podiatry consulted.  Patient was given 1 L normal saline IV fluids, vancomycin, and Zosyn.  Admitted to hospital service.  Podiatry and ID consulted since patient developed MRSA bacteremia.  Sepsis secondary to diabetic foot infection/cellulitis of the left foot/tenosynovitis/possible early osteomyelitis of distal fibula/MRSA bacteremia: Patient meets sepsis criteria based on tachypnea and leukocytosis.  Patient presents for a nonhealing wound and pain on the lateral aspect of the left foot since the beginning of this month. Patient had initially been started on empiric antibiotics of vancomycin, metronidazole, and cefepime however blood culture showed MRSA, she  was transitioned to vancomycin per ID recommendations.  ID formally consulted. MRI of the left foot and ankle-moderate tenosynovitis appreciated with signs of early osteomyelitis.  Seen by podiatry.  Underwent Left ankle incision and drainage with debridement at peroneal tendons and biopsy of fibula.  Transthoracic echo could not rule out vegetation.  45% ejection fraction.  Patient underwent TEE which ruled out endocarditis.  Repeat blood culture from 04/24/2021 are negative thus far.  ID has recommended to place PICC line and continue IV vancomycin for 2 weeks with end date of 05/09/2021.   Paroxysmal  atrial fibrillation on chronic anticoagulation: Patient appears to be in sinus rhythm at this time.   Continue Toprol-XL and Eliquis.   Normocytic anemia: Hemoglobin is stable.   Peripheral vascular disease  CAD: Resume Plavix and statin.   Diabetes mellitus type 2: Resume home medications.   Panhypopituitarism  -Continue levothyroxine and Decadron   Essential hypertension: Resume home medications.   Thrombocytosis: Acute.  Suspected secondary to infection.  Improving.  Monitor   Hyperlipidemia: Associated with diabetes. -Continue atorvastatin   Hypoalbuminemia: Albumin noted to be 2.2. -Check prealbumin   GERD  -Continue Protonix   Obesity: BMI 34.61 kg/m2  Disposition: Patient was assessed by PT OT who recommended home with home health.  I have discussed with Dr. Cannon Kettle who has cleared the patient as well.  Patient is being discharged in stable condition.  She will receive her vancomycin today as well as PICC line will be placed and then she will have her first dose with IV infusion teaching tomorrow.  This was confirmed by TOC.  Discharge plan was discussed with patient and/or family member and they verbalized understanding and agreed with it.  Discharge Diagnoses:  Principal Problem:   MRSA bacteremia Active Problems:   Panhypopituitarism ([place])   Hyperlipidemia associated with  type 2 diabetes mellitus ([place])   CAD S/P percutaneous coronary angioplasty   Essential hypertension   Obesity (BMI 30-39.9)   PAD (peripheral artery disease) ([facility])   Cellulitis   Thrombocytosis   Sepsis ([facility])   Tenosynovitis of left ankle   Protein-calorie malnutrition, severe    Discharge Instructions  Discharge Instructions     [facility] pharmacist to adjust dose for Vancomycin, Aminoglycosides and other anti-infective therapies as requested by physician.   Complete by: As directed    [facility] to provide Cath Flo 6m   Complete by: As directed    Administer for PICC line occlusion and as ordered by physician for other access device issues.   Anaphylaxis Kit: Provided to treat any anaphylactic reaction to the medication being provided to the patient if First Dose or when requested by physician   Complete by: As directed    Epinephrine 155mml vial / amp: Administer 0.68m42m0.68ml28mubcutaneously once for moderate to severe anaphylaxis, nurse to call physician and pharmacy when reaction  occurs and call 911 if needed for immediate care   Diphenhydramine 54m/ml IV vial: Administer 25-5100mIV/IM PRN for first dose reaction, rash, itching, mild reaction, nurse to call physician and pharmacy when reaction occurs   Sodium Chloride 0.9% NS 5005mV: Administer if needed for hypovolemic blood pressure drop or as ordered by physician after call to physician with anaphylactic reaction   Change dressing on IV access line weekly and PRN   Complete by: As directed    Flush IV access with Sodium Chloride 0.9% and Heparin 10 units/ml or 100 units/ml   Complete by: As directed    Home infusion instructions - [facility]   Complete by: As directed    Instructions: Flush IV access with Sodium Chloride 0.9% and Heparin 10units/ml or 100units/ml   Change dressing on IV access line: Weekly and PRN   Instructions Cath Flo 2mg68mdminister for PICC Line occlusion and as  ordered by physician for other access device   [facility] pharmacist to adjust dose for: Vancomycin, Aminoglycosides and other anti-infective therapies as requested by physician   Method of administration may be changed at the discretion of home infusion pharmacist based upon assessment of the patient and/or caregivers ability to self-administer the medication ordered   Complete by: As directed       Allergies as of 04/27/2021       Reactions   Strawberry Extract Itching, Swelling, Anaphylaxis   Mouth swells and gets itchy        Medication List     TAKE these medications    acetaminophen 500 MG tablet Commonly known as: TYLENOL Take 500-1,000 mg by mouth 3 (three) times daily as needed for headache (pain).   apixaban 5 MG Tabs tablet Commonly known as: ELIQUIS Take 1 tablet (5 mg total) by mouth 2 (two) times daily.   atorvastatin 40 MG tablet Commonly known as: LIPITOR Take 1 tablet (40 mg total) by mouth daily at 6 PM.   B-D UF III MINI PEN NEEDLES 31G X 5 MM Misc Generic drug: Insulin Pen Needle Inject into the skin.   Basaglar KwikPen 100 UNIT/ML Inject 46 Units into the skin at bedtime.   clopidogrel 75 MG tablet Commonly known as: PLAVIX Take 1 tablet by mouth once daily with breakfast What changed: when to take this   dapagliflozin propanediol 10 MG Tabs tablet Commonly known as: FARXIGA Take 10 mg by mouth every morning.   dexamethasone 0.5 MG tablet Commonly known as: DECADRON Take 0.5 mg by mouth every morning.   ferrous sulfate 325 (65 FE) MG tablet Take 325 mg by mouth every evening.   Fish Oil 1000 MG Caps Take 1,000 mg by mouth every morning.   FreeStyle Libre 2 Sensor Misc Apply topically every 14 (fourteen) days.   HYDROcodone-acetaminophen 5-325 MG tablet Commonly known as: Norco Take 1 tablet by mouth every 12 (twelve) hours as needed for moderate pain.   levothyroxine 125 MCG tablet Commonly known as: SYNTHROID Take 1  tablet (125 mcg total) by mouth daily before breakfast.   losartan 25 MG tablet Commonly known as: COZAAR Take 1 tablet (25 mg total) by mouth daily. What changed: when to take this   metoprolol succinate 25 MG 24 hr tablet Commonly known as: TOPROL-XL TAKE 1 TABLET BY MOUTH EVERY MORNING & TAKE 1 TABLET BY MOUTH AT BEDTIME What changed: See the new instructions.   nitroGLYCERIN 0.4 MG SL tablet Commonly known as: Nitrostat Place 1 tablet (0.4  mg total) under the tongue every 5 (five) minutes as needed for chest pain (up to 3 doses. If taking 3rd dose call 911).   NovoLOG FlexPen 100 UNIT/ML FlexPen Generic drug: insulin aspart Inject 3-8 Units into the skin See admin instructions. Inject 3-8 units subcutaneously up to three times daily per sliding scale - based on carb count   nystatin cream Commonly known as: MYCOSTATIN Apply 1 application topically 2 (two) times daily. What changed:  when to take this reasons to take this   OneTouch Verio test strip Generic drug: glucose blood   pantoprazole 40 MG tablet Commonly known as: Protonix Take 1 tablet (40 mg total) by mouth 2 (two) times daily before a meal.   prochlorperazine 5 MG tablet Commonly known as: COMPAZINE Take 1 tablet (5 mg total) by mouth every 8 (eight) hours as needed for nausea or vomiting.   spironolactone 25 MG tablet Commonly known as: ALDACTONE Take 1 tablet (25 mg total) by mouth daily. What changed: when to take this   Systane Complete 0.6 % Soln Generic drug: Propylene Glycol Place 1 drop into both eyes daily as needed (dry eyes).   vancomycin  IVPB Inject 750 mg into the vein every 12 (twelve) hours for 12 days. Indication:  MRSA bacteremia First Dose: No Last Day of Therapy:  05/09/21 Labs - _0 /04/23 1021            Follow-up Information     [facility] Follow up.   Specialty: [place] Why: the office will call to schedule home health visits Contact information: [place] [address] [phone]         [name], MD Follow up in 1 week(s).   Specialty: Endocrinology Contact information: [place] [address] [phone]         Leonie Man, MD .   Specialty: Cardiology Contact information: [address] [place] [address] [phone]                Allergies  Allergen Reactions   Strawberry Extract Itching, Swelling and Anaphylaxis    Mouth swells and gets itchy    Consultations: Podiatry and ID   Procedures/Studies: DG Tibia/Fibula Left  Result Date: 04/22/2021 CLINICAL DATA:  concern for osteo EXAM: LEFT TIBIA AND FIBULA - 2 VIEW COMPARISON:  None. FINDINGS: No cortical erosion or destruction. Couple oval pericentimeter ossific densities within the lateral tibiofemoral joint space. There is no evidence of fracture or other focal bone lesions. Visualized knee and ankle grossly unremarkable. Subcutaneus soft tissue edema of the ankle. Vascular calcifications. IMPRESSION: 1. No radiographic findings suggest acute osteomyelitis. No acute displaced fracture or dislocation. 2. Couple pericentimeter loose bodies within the lateral tibiofemoral joint space. 3. Subcutaneus soft tissue edema of the ankle.  Consider dedicated ankle radiograph. Electronically Signed   By: Iven Finn M.D.   On: 04/22/2021  18:05   DG Ankle Complete Left  Result Date: 04/22/2021 CLINICAL DATA:  Left foot pain and swelling, left foot wound EXAM: LEFT ANKLE COMPLETE - 3+ VIEW COMPARISON:  04/19/2021 FINDINGS: Frontal, oblique, lateral views of the left ankle are obtained. There are no acute or destructive bony lesions. Postsurgical changes from prior fifth metatarsal amputation again noted. Stable small inferior calcaneal spur. There is diffuse soft tissue swelling of the lower leg, ankle, and hindfoot. No radiopaque foreign body. Stable soft tissue calcifications. IMPRESSION: 1. Stable diffuse soft tissue swelling. No radiographic evidence of osteomyelitis. Electronically Signed   By: Randa Ngo M.D.   On: 04/22/2021 18:00   MR FOOT LEFT WO CONTRAST  Result Date: 04/23/2021 CLINICAL DATA:  Left foot and ankle pain and swelling. Recent surgery. EXAM: MRI OF THE LEFT ANKLE WITHOUT CONTRAST; MRI OF THE LEFT FOOT WITHOUT CONTRAST TECHNIQUE: Multiplanar, multisequence MR imaging of the ankle and foot was performed. No intravenous contrast was administered. COMPARISON:  Left ankle x-rays dated April 22, 2021. Left foot x-rays dated April 19, 2021. MRI left foot dated May 24, 2019. FINDINGS: TENDONS Peroneal: Tendinosis and partial tears of the peroneal longus and brevis tendons with moderate amount of fluid in the tendon sheath. The tendon sheath appears to communicate with the focal soft tissue ulceration in the lateral distal lower leg (series 4, image 10). Posteromedial: Posterior tibial tendon intact. Flexor digitorum longus tendon intact. Flexor hallucis longus tendon intact. Anterior: Tibialis anterior tendon intact. Extensor hallucis longus tendon intact Extensor digitorum longus tendon intact. Achilles:  Intact. Plantar Fascia: Thickened proximal central band with small partial tear. LIGAMENTS Lateral: Anterior talofibular ligament intact, best appreciated on the sagittal and coronal images. Calcaneofibular ligament  intact. Posterior talofibular ligament intact. Anterior and posterior tibiofibular ligaments intact. Medial: Deltoid ligament intact. Spring ligament intact. Lisfranc ligament intact. CARTILAGE Ankle Joint: Small joint effusion. Normal ankle mortise. No chondral defect. Subtalar Joints/Sinus Tarsi: Normal subtalar joints. Small posterior subtalar joint effusion. Partial loss of the normal fat within the sinus tarsi. Bones: Patchy geographic increased marrow signal within the calcaneus, likely red marrow hyperplasia. Mildly increased marrow edema in the distal fibula adjacent to the peroneal tendons. Moderate second TMT joint osteoarthritis. Mild third through fifth TMT joint osteoarthritis. Prior fifth ray amputation. Soft Tissue: Diffuse soft tissue swelling. No soft tissue mass or fluid collection. IMPRESSION: 1. Tendinosis and partial tears of the peroneal longus and brevis tendons with moderate tenosynovitis, likely infectious given the tendon sheath appears to communicate with the focal soft tissue ulceration in the lateral distal lower leg. 2. Mildly increased marrow edema in the distal fibula adjacent to the peroneal tendons, concerning for early osteomyelitis given peroneal infection. 3. Prior fifth ray amputation. No evidence of osteomyelitis in the foot. Electronically Signed   By: Titus Dubin M.D.   On: 04/23/2021 12:24   MR ANKLE LEFT WO CONTRAST  Result Date: 04/23/2021 CLINICAL DATA:  Left foot and ankle pain and swelling. Recent surgery. EXAM: MRI OF THE LEFT ANKLE WITHOUT CONTRAST; MRI OF THE LEFT FOOT WITHOUT CONTRAST TECHNIQUE: Multiplanar, multisequence MR imaging of the ankle and foot was performed. No intravenous contrast was administered. COMPARISON:  Left ankle x-rays dated April 22, 2021. Left foot x-rays dated April 19, 2021. MRI left foot dated May 24, 2019. FINDINGS: TENDONS Peroneal: Tendinosis and partial tears of the peroneal longus and brevis tendons with moderate  amount  of fluid in the tendon sheath. The tendon sheath appears to communicate with the focal soft tissue ulceration in the lateral distal lower leg (series 4, image 10). Posteromedial: Posterior tibial tendon intact. Flexor digitorum longus tendon intact. Flexor hallucis longus tendon intact. Anterior: Tibialis anterior tendon intact. Extensor hallucis longus tendon intact Extensor digitorum longus tendon intact. Achilles:  Intact. Plantar Fascia: Thickened proximal central band with small partial tear. LIGAMENTS Lateral: Anterior talofibular ligament intact, best appreciated on the sagittal and coronal images. Calcaneofibular ligament intact. Posterior talofibular ligament intact. Anterior and posterior tibiofibular ligaments intact. Medial: Deltoid ligament intact. Spring ligament intact. Lisfranc ligament intact. CARTILAGE Ankle Joint: Small joint effusion. Normal ankle mortise. No chondral defect. Subtalar Joints/Sinus Tarsi: Normal subtalar joints. Small posterior subtalar joint effusion. Partial loss of the normal fat within the sinus tarsi. Bones: Patchy geographic increased marrow signal within the calcaneus, likely red marrow hyperplasia. Mildly increased marrow edema in the distal fibula adjacent to the peroneal tendons. Moderate second TMT joint osteoarthritis. Mild third through fifth TMT joint osteoarthritis. Prior fifth ray amputation. Soft Tissue: Diffuse soft tissue swelling. No soft tissue mass or fluid collection. IMPRESSION: 1. Tendinosis and partial tears of the peroneal longus and brevis tendons with moderate tenosynovitis, likely infectious given the tendon sheath appears to communicate with the focal soft tissue ulceration in the lateral distal lower leg. 2. Mildly increased marrow edema in the distal fibula adjacent to the peroneal tendons, concerning for early osteomyelitis given peroneal infection. 3. Prior fifth ray amputation. No evidence of osteomyelitis in the foot. Electronically Signed    By: Titus Dubin M.D.   On: 04/23/2021 12:24   PERIPHERAL VASCULAR CATHETERIZATION  Result Date: 04/18/2021 Patient name: Tammy Good   MRN: [id number]        DOB: 19-Aug-1950            Sex: female  04/18/2021 Pre-operative Diagnosis: Left lower extremity foot wound with known PAD and concern for in-stent stenosis of left SFA stent Post-operative diagnosis:  Same Surgeon:  Marty Heck, MD Procedure Performed: 1.  Ultrasound-guided access right common femoral artery 2.  Aortogram with catheter selection of aorta 3.  Left lower extremity arteriogram with selection of second-order branches 4.  Mynx closure of the right common femoral artery 5.  25 minutes of monitored moderate conscious sedation time  Contrast: 110 mL  Indications: Patient is a 71 year old female who recently sustained a fall and now has a left foot wound.  She was seen in follow-up in our office given a history of PAD with left SFA stenting in 2021.  Noninvasive imaging suggested in-stent stenosis of the left SFA stent.  She presents today after risk benefits discussed.  Findings:  Aortogram showed a widely patent infrarenal aorta and widely patent bilateral iliac arteries.  The right renal is widely patent.  The left renal has about a 30% stenosis near the ostium.  Left lower extremity runoff shows a widely patent common femoral artery with no evidence of flow-limiting stenosis.  The profunda is patent but diseased.  The proximal SFA stent is widely patent with no in-stent stenosis.  The mid to distal SFA is diffusely diseased but no flow-limiting stenosis and disease appears to be less than 50%.  Patent above and below-knee popliteal artery.  Patent three-vessel runoff into the foot.  Very brisk flow down the left lower extremity.  No intervention necessary.  Optimized from a vascular standpoint.  Procedure:  The patient was identified in the holding area and taken to room 8.  The patient was then placed supine on the  table and prepped and draped in the usual sterile fashion.  A time out was called.  Ultrasound was used to evaluate the right common femoral artery.  It was patent .  A digital ultrasound image was acquired.  A micropuncture needle was used to access the right common femoral artery under ultrasound guidance.  An 018 wire was advanced without resistance and a micropuncture sheath was placed.  The 018 wire was removed and a benson wire was placed.  The micropuncture sheath was exchanged for a 5 french sheath.  An omniflush catheter was advanced over the wire to the level of L-1.  An abdominal angiogram was obtained.  Next, using the omniflush catheter and a benson wire, the aortic bifurcation was crossed and the catheter was placed into the the left external iliac artery and left runoff was obtained.  After evaluating images, we did not feel there was any flow limitation.  The common femoral is widely patent.  The left SFA stent in the proximal vessel is widely patent.  There is some diffuse disease in the SFA that does not appear flow-limiting and all less than 50%.  Three-vessel runoff into the foot.  Wires and catheters removed.  An access shot was obtained in the right common femoral artery.  Mynx closure of the right common femoral artery.  Taken to holding in stable condition.  Plan: Patient has three-vessel runoff in the left lower extremity and is optimized from a vascular standpoint.  We will arrange follow-up in 6 months with noninvasive imaging.     Marty Heck, MD [facility] Office: [phone]    VAS [place] ABI WITH/WO TBI  Result Date: 04/09/2021  LOWER EXTREMITY DOPPLER STUDY Patient Name:  Tammy Good  Date of Exam:   04/08/2021 Medical Rec #: [id number]          Accession #:    [id number] Date of Birth: 1950/09/28           Patient Gender: F Patient Age:   70 years Exam Location:  [facility] Procedure:      VAS [place] ABI WITH/WO TBI  Referring Phys: EMMA COLLINS --------------------------------------------------------------------------------  Indications: Pain and redness in the left leg. Large blister on lateral              malleolus. Patient states it came up while wearing compression              socks.  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                         05/26/19: Left SFA revascularization with mechanical                         orbital atherectomy.                         Left SFA angioplasty including drug-coated balloon                         Left SFA proximal stent placement for focal dissection                         10/26/20:  Right mid to distal SFA and above-knee popliteal                         artery angioplasty with stent placement. Performing Technologist: Ralene Cork RVT  Examination Guidelines: A complete evaluation includes at minimum, Doppler waveform signals and systolic blood pressure reading at the level of bilateral brachial, anterior tibial, and posterior tibial arteries, when vessel segments are accessible. Bilateral testing is considered an integral part of a complete examination. Photoelectric Plethysmograph (PPG) waveforms and toe systolic pressure readings are included as required and additional duplex testing as needed. Limited examinations for reoccurring indications may be performed as noted.  ABI Findings: +---------+------------------+-----+---------+--------+  Right     Rt Pressure (mmHg) Index Waveform  Comment   +---------+------------------+-----+---------+--------+  Brachial  126                                          +---------+------------------+-----+---------+--------+  PTA       78                 0.62  triphasic           +---------+------------------+-----+---------+--------+  DP        97                 0.77  triphasic           +---------+------------------+-----+---------+--------+  Great Toe 58                 0.46                       +---------+------------------+-----+---------+--------+ +---------+------------------+-----+-------------------+-------+  Left      Lt Pressure (mmHg) Index Waveform            Comment  +---------+------------------+-----+-------------------+-------+  Brachial  123                                                   +---------+------------------+-----+-------------------+-------+  PTA       79                 0.63  dampened monophasic          +---------+------------------+-----+-------------------+-------+  DP        78                 0.62  monophasic                   +---------+------------------+-----+-------------------+-------+  Great Toe 43                 0.34                               +---------+------------------+-----+-------------------+-------+ +-------+-----------+-----------+------------+------------+  ABI/TBI Today's ABI Today's TBI Previous ABI Previous TBI  +-------+-----------+-----------+------------+------------+  Right   0.77        0.46        0.99         0.73          +-------+-----------+-----------+------------+------------+  Left    0.63        0.34  0.84         0.59          +-------+-----------+-----------+------------+------------+  Previous ABI on 01/01/21.  Summary: Right: Resting right ankle-brachial index indicates moderate right lower extremity arterial disease. The right toe-brachial index is abnormal. Left: Resting left ankle-brachial index indicates moderate left lower extremity arterial disease. The left toe-brachial index is abnormal.  *See table(s) above for measurements and observations.  Electronically signed by Monica Martinez MD on 04/09/2021 at 10:39:57 AM.    Final    ECHO TEE  Result Date: 04/26/2021    TRANSESOPHOGEAL ECHO REPORT   Patient Name:   Tammy Good Date of Exam: 04/26/2021 Medical Rec #:  [id number]         Height:       67.0 in Accession #:    [id number]        Weight:       215.4 lb Date of Birth:  04-19-1950          BSA:          2.087  m Patient Age:    46 years          BP:           126/82 mmHg Patient Gender: F                 HR:           65 bpm. Exam Location:  Inpatient Procedure: Transesophageal Echo, Cardiac Doppler and Color Doppler Indications:     Bacteremia  History:         Patient has prior history of Echocardiogram examinations, most                  recent 04/24/2021. CAD, PAD; Risk Factors:Dyslipidemia and                  Hypertension. Sepsis. Cellulitis.  Sonographer:     Darlina Sicilian RDCS Referring Phys:  [id number] Darreld Mclean Diagnosing Phys: Buford Dresser MD PROCEDURE: After discussion of the risks and benefits of a TEE, an informed consent was obtained from the patient. TEE procedure time was 9 minutes. The transesophogeal probe was passed without difficulty through the esophogus of the patient. Imaged were  obtained with the patient in a left lateral decubitus position. Local oropharyngeal anesthetic was provided with viscous lidocaine. Sedation performed by different physician. The patient was monitored while under deep sedation. Anesthestetic sedation was provided intravenously by Anesthesiology: 228.54m of Propofol, 67mof Lidocaine. Image quality was good. The patient developed no complications during the procedure. IMPRESSIONS  1. Left ventricular ejection fraction, by estimation, is 40 to 45%. The left ventricle has mildly decreased function. The left ventricle demonstrates global hypokinesis.  2. Right ventricular systolic function is normal. The right ventricular size is normal.  3. Left atrial size was mildly dilated. No left atrial/left atrial appendage thrombus was detected.  4. The mitral valve is normal in structure. Trivial mitral valve regurgitation. No evidence of mitral stenosis.  5. The aortic valve is tricuspid. Aortic valve regurgitation is not visualized. No aortic stenosis is present.  6. There is mild (Grade II) plaque involving the descending aorta. Conclusion(s)/Recommendation(s): No  evidence of vegetation/infective endocarditis on this transesophageael echocardiogram. FINDINGS  Left Ventricle: Left ventricular ejection fraction, by estimation, is 40 to 45%. The left ventricle has mildly decreased function. The left ventricle demonstrates global hypokinesis. The left ventricular internal cavity size was normal in size. Right Ventricle: The  right ventricular size is normal. No increase in right ventricular wall thickness. Right ventricular systolic function is normal. Left Atrium: Left atrial size was mildly dilated. No left atrial/left atrial appendage thrombus was detected. Right Atrium: Right atrial size was normal in size. Prominent Eustachian valve. Pericardium: There is no evidence of pericardial effusion. Mitral Valve: The mitral valve is normal in structure. Trivial mitral valve regurgitation. No evidence of mitral valve stenosis. There is no evidence of mitral valve vegetation. Tricuspid Valve: The tricuspid valve is normal in structure. Tricuspid valve regurgitation is trivial. No evidence of tricuspid stenosis. There is no evidence of tricuspid valve vegetation. Aortic Valve: Trivial Lambl's excrescence. The aortic valve is tricuspid. Aortic valve regurgitation is not visualized. No aortic stenosis is present. There is no evidence of aortic valve vegetation. Pulmonic Valve: The pulmonic valve was grossly normal. Pulmonic valve regurgitation is trivial. No evidence of pulmonic stenosis. There is no evidence of pulmonic valve vegetation. Aorta: The aortic root is normal in size and structure. There is mild (Grade II) plaque involving the descending aorta. IAS/Shunts: No atrial level shunt detected by color flow Doppler.  TRICUSPID VALVE TR Peak grad:   18.1 mmHg TR Vmax:        213.00 cm/s Buford Dresser MD Electronically signed by Buford Dresser MD Signature Date/Time: 04/26/2021/4:37:07 PM    Final    XR Foot Complete Left  Result Date: 04/19/2021 AP, oblique, lateral  views of left foot reviewed.  Evidence of fifth ray amputation previously.  No evidence of osteomyelitis.  No fracture or dislocation.  XR Tibia/Fibula Left  Result Date: 04/01/2021 2 view radiographs of the right tibia and fibula does show calcification of the arteries does show some calcification in the popliteal fossa of the left knee no fractures there are some calcifications in the soft tissue around the ankle  VAS [place] LOWER EXTREMITY ARTERIAL DUPLEX  Result Date: 04/09/2021 [place] STUDY Patient Name:  Tammy Good  Date of Exam:   04/08/2021 Medical Rec #: [id number]          Accession #:    [id number] Date of Birth: [birth year]           Patient Gender: F Patient Age:   32 years Exam Location:  [facility] Procedure:      VAS [place] LOWER EXTREMITY ARTERIAL DUPLEX Referring Phys: EMMA COLLINS --------------------------------------------------------------------------------  Indications: Pain and redness in the left leg. Large blister on lateral              malleolus. Patient states it came up while wearing compression              socks.  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                         05/26/19: Left SFA revascularization with mechanical                         orbital atherectomy.                         Left SFA angioplasty including drug-coated balloon                         Left SFA proximal stent placement for focal dissection  10/26/20: Right mid to distal SFA and above-knee popliteal                         artery angioplasty with stent placement. Current ABI:            Right 0.77/0.46 Left 0.63/0.34 Comparison Study: 10/02/19: Left: 50-74% stenosis noted in the common femoral                   artery. 50-74% stenosis noted in the deep                   femoral artery. 50-74% stenosis noted in the mid superficial                   femoral artery. Patent proximal                   superficial femoral artery  stent with velocities suggesting a                   1-49% stenosis. Performing Technologist: Ralene Cork RVT  Examination Guidelines: A complete evaluation includes B-mode imaging, spectral Doppler, color Doppler, and power Doppler as needed of all accessible portions of each vessel. Bilateral testing is considered an integral part of a complete examination. Limited examinations for reoccurring indications may be performed as noted.  +----------+--------+-----+---------------+----------+--------+  LEFT       PSV cm/s Ratio Stenosis        Waveform   Comments  +----------+--------+-----+---------------+----------+--------+  CFA Distal 243            30-49% stenosis monophasic           +----------+--------+-----+---------------+----------+--------+  SFA Prox                                             stent     +----------+--------+-----+---------------+----------+--------+  SFA Mid    317            50-74% stenosis monophasic           +----------+--------+-----+---------------+----------+--------+  SFA Distal 135                            monophasic           +----------+--------+-----+---------------+----------+--------+  POP Prox   165            30-49% stenosis monophasic           +----------+--------+-----+---------------+----------+--------+  POP Distal 119                            monophasic           +----------+--------+-----+---------------+----------+--------+  ATA Distal 58                             monophasic           +----------+--------+-----+---------------+----------+--------+  PTA Distal 67                             monophasic           +----------+--------+-----+---------------+----------+--------+  PERO Mid   42  monophasic           +----------+--------+-----+---------------+----------+--------+  Left Stent(s): +---------------+--------+--------+----------+--------+  proximal SFA    PSV cm/s Stenosis Waveform   Comments   +---------------+--------+--------+----------+--------+  Prox to Stent   140               monophasic           +---------------+--------+--------+----------+--------+  Proximal Stent  132               monophasic           +---------------+--------+--------+----------+--------+  Mid Stent       146               monophasic           +---------------+--------+--------+----------+--------+  Distal Stent    195               monophasic           +---------------+--------+--------+----------+--------+  Distal to Stent 171               monophasic           +---------------+--------+--------+----------+--------+  Summary: Left: 50-74% stenosis in the CFA. Patent proximal SFA stent. 50-74% stenosis in the mid SFA 30-49% stenosis in the proximal popliteal with no significant plaque visualized. Unable to distinquish distal SFA stent walls.  See table(s) above for measurements and observations. Electronically signed by Monica Martinez MD on 04/09/2021 at 11:37:43 AM.    Final    ECHOCARDIOGRAM LIMITED  Result Date: 04/24/2021    ECHOCARDIOGRAM LIMITED REPORT   Patient Name:   Tammy Good Date of Exam: 04/24/2021 Medical Rec #:  [id number]         Height:       67.0 in Accession #:    [id number]        Weight:       221.0 lb Date of Birth:  04/30/1950          BSA:          2.110 m Patient Age:    53 years          BP:           123/51 mmHg Patient Gender: F                 HR:           70 bpm. Exam Location:  Inpatient Procedure: Limited Echo, Cardiac Doppler and Color Doppler Indications:    Bacteremia  History:        Patient has prior history of Echocardiogram examinations, most                 recent 11/16/2020. Angina and CAD; Risk Factors:Dyslipidemia,                 Hypertension and Diabetes.  Sonographer:    Arlyss Gandy Referring Phys: [place] Comments: Patient is morbidly obese. Image acquisition challenging due to patient body habitus. IMPRESSIONS  1. Left ventricular ejection  fraction, by estimation, is 40 to 45%. The left ventricle has mildly decreased function. There is mild concentric left ventricular hypertrophy. Left ventricular diastolic parameters are indeterminate.  2. The mitral valve was not well visualized. No evidence of mitral valve regurgitation.  3. The aortic valve is tricuspid. Conclusion(s)/Recommendation(s): Cannot exclude vegetation due to poor visibility of the valves. Consider transesophageal echocardiogram, if clinically indicated. FINDINGS  Left Ventricle: Left ventricular ejection fraction, by  estimation, is 40 to 45%. The left ventricle has mildly decreased function. There is mild concentric left ventricular hypertrophy. Left ventricular diastolic parameters are indeterminate. Right Ventricle: Right vetricular wall thickness was not well visualized. Mitral Valve: The mitral valve was not well visualized. Tricuspid Valve: The tricuspid valve is not well visualized. Tricuspid valve regurgitation is trivial. Aortic Valve: The aortic valve is tricuspid. Pulmonic Valve: The pulmonic valve was not well visualized. LEFT VENTRICLE PLAX 2D LVIDd:         3.50 cm LVIDs:         2.60 cm LV PW:         1.20 cm LV IVS:        1.20 cm  TRICUSPID VALVE TR Peak grad:   22.1 mmHg TR Vmax:        235.00 cm/s Kardie Tobb DO Electronically signed by Berniece Salines DO Signature Date/Time: 04/24/2021/3:58:18 PM    Final    [place] EKG SITE RITE  Result Date: 04/27/2021 If Site Rite image not attached, placement could not be confirmed due to current cardiac rhythm.    Discharge Exam: Vitals:   04/27/21 0744 04/27/21 1444  BP: (!) 124/53   Pulse: (!) 54 60  Resp:  18  Temp: 98.4 F (36.9 C) 98.2 F (36.8 C)  SpO2: 98% 99%   Vitals:   04/26/21 2006 04/27/21 0532 04/27/21 0744 04/27/21 1444  BP: 140/64 (!) 130/59 (!) 124/53   Pulse: 67 69 (!) 54 60  Resp:    18  Temp: 97.6 F (36.4 C) 98.7 F (37.1 C) 98.4 F (36.9 C) 98.2 F (36.8 C)  TempSrc: Oral Oral Oral Oral   SpO2: 91% 99% 98% 99%  Weight:      Height:        General: Pt is alert, awake, not in acute distress Cardiovascular: RRR, S1/S2 +, no rubs, no gallops Respiratory: CTA bilaterally, no wheezing, no rhonchi Abdominal: Soft, NT, ND, bowel sounds + Extremities: no edema, no cyanosis    The results of significant diagnostics from this hospitalization (including imaging, microbiology, ancillary and laboratory) are listed below for reference.     Microbiology: Recent Results (from the past 240 hour(s))  Blood culture (routine x 2)     Status: Abnormal   Collection Time: 04/22/21  5:33 PM   Specimen: BLOOD  Result Value Ref Range Status   Specimen Description BLOOD SITE NOT SPECIFIED  Final   Special Requests   Final    BOTTLES DRAWN AEROBIC AND ANAEROBIC Blood Culture adequate volume   Culture  Setup Time   Final    GRAM POSITIVE COCCI IN CLUSTERS IN BOTH AEROBIC AND ANAEROBIC BOTTLES CRITICAL RESULT CALLED TO, READ BACK BY AND VERIFIED WITH: [place] [phone] FCP Performed at [facility], [place] [address], [place], [place] [address]    Culture METHICILLIN RESISTANT STAPHYLOCOCCUS AUREUS (A)  Final   Report Status 04/25/2021 FINAL  Final   Organism ID, Bacteria METHICILLIN RESISTANT STAPHYLOCOCCUS AUREUS  Final      Susceptibility   Methicillin resistant staphylococcus aureus - MIC*    CIPROFLOXACIN >=8 RESISTANT Resistant     ERYTHROMYCIN >=8 RESISTANT Resistant     GENTAMICIN <=0.5 SENSITIVE Sensitive     OXACILLIN >=4 RESISTANT Resistant     TETRACYCLINE <=1 SENSITIVE Sensitive     VANCOMYCIN 1 SENSITIVE Sensitive     TRIMETH/SULFA 160 RESISTANT Resistant     CLINDAMYCIN <=0.25 SENSITIVE Sensitive  RIFAMPIN <=0.5 SENSITIVE Sensitive     Inducible Clindamycin NEGATIVE Sensitive     * METHICILLIN RESISTANT STAPHYLOCOCCUS AUREUS  Blood Culture ID Panel (Reflexed)     Status: Abnormal   Collection Time: 04/22/21  5:33 PM  Result Value Ref Range Status    Enterococcus faecalis NOT DETECTED NOT DETECTED Final   Enterococcus Faecium NOT DETECTED NOT DETECTED Final   Listeria monocytogenes NOT DETECTED NOT DETECTED Final   Staphylococcus species DETECTED (A) NOT DETECTED Final    Comment: CRITICAL RESULT CALLED TO, READ BACK BY AND VERIFIED WITH: PHARMD JEREMY F. [phone] FCP    Staphylococcus aureus (BCID) DETECTED (A) NOT DETECTED Final    Comment: Methicillin (oxacillin)-resistant Staphylococcus aureus (MRSA). MRSA is predictably resistant to beta-lactam antibiotics (except ceftaroline). Preferred therapy is vancomycin unless clinically contraindicated. Patient requires contact precautions if  hospitalized. CRITICAL RESULT CALLED TO, READ BACK BY AND VERIFIED WITH: PHARMD JEREMY F. [phone] FCP    Staphylococcus epidermidis NOT DETECTED NOT DETECTED Final   Staphylococcus lugdunensis NOT DETECTED NOT DETECTED Final   Streptococcus species NOT DETECTED NOT DETECTED Final   Streptococcus agalactiae NOT DETECTED NOT DETECTED Final   Streptococcus pneumoniae NOT DETECTED NOT DETECTED Final   Streptococcus pyogenes NOT DETECTED NOT DETECTED Final   A.calcoaceticus-baumannii NOT DETECTED NOT DETECTED Final   Bacteroides fragilis NOT DETECTED NOT DETECTED Final   Enterobacterales NOT DETECTED NOT DETECTED Final   Enterobacter cloacae complex NOT DETECTED NOT DETECTED Final   Escherichia coli NOT DETECTED NOT DETECTED Final   Klebsiella aerogenes NOT DETECTED NOT DETECTED Final   Klebsiella oxytoca NOT DETECTED NOT DETECTED Final   Klebsiella pneumoniae NOT DETECTED NOT DETECTED Final   Proteus species NOT DETECTED NOT DETECTED Final   Salmonella species NOT DETECTED NOT DETECTED Final   Serratia marcescens NOT DETECTED NOT DETECTED Final   Haemophilus influenzae NOT DETECTED NOT DETECTED Final   Neisseria meningitidis NOT DETECTED NOT DETECTED Final   Pseudomonas aeruginosa NOT DETECTED NOT DETECTED Final   Stenotrophomonas maltophilia  NOT DETECTED NOT DETECTED Final   Candida albicans NOT DETECTED NOT DETECTED Final   Candida auris NOT DETECTED NOT DETECTED Final   Candida glabrata NOT DETECTED NOT DETECTED Final   Candida krusei NOT DETECTED NOT DETECTED Final   Candida parapsilosis NOT DETECTED NOT DETECTED Final   Candida tropicalis NOT DETECTED NOT DETECTED Final   Cryptococcus neoformans/gattii NOT DETECTED NOT DETECTED Final   Meth resistant mecA/C and MREJ DETECTED (A) NOT DETECTED Final    Comment: CRITICAL RESULT CALLED TO, READ BACK BY AND VERIFIED WITH: PHARMD JEREMY F. [id number] FCP Performed at [facility], [address], [place], [place] [address]   Resp Panel by RT-PCR (Flu A&B, Covid) Nasopharyngeal Swab     Status: None   Collection Time: 04/23/21  9:08 AM   Specimen: Nasopharyngeal Swab; Nasopharyngeal(NP) swabs in vial transport medium  Result Value Ref Range Status   SARS Coronavirus 2 by RT PCR NEGATIVE NEGATIVE Final    Comment: (NOTE) SARS-CoV-2 target nucleic acids are NOT DETECTED.  The SARS-CoV-2 RNA is generally detectable in upper respiratory specimens during the acute phase of infection. The lowest concentration of SARS-CoV-2 viral copies this assay can detect is 138 copies/mL. A negative result does not preclude SARS-Cov-2 infection and should not be used as the sole basis for treatment or other patient management decisions. A negative result may occur with  improper specimen collection/handling, submission of specimen other than nasopharyngeal swab, presence of  viral mutation(s) within the areas targeted by this assay, and inadequate number of viral copies(<138 copies/mL). A negative result must be combined with clinical observations, patient history, and epidemiological information. The expected result is Negative.  Fact Sheet for Patients:  EntrepreneurPulse.com.au  Fact Sheet for Healthcare Providers:   IncredibleEmployment.be  This test is no t yet approved or cleared by the [place] FDA and  has been authorized for detection and/or diagnosis of SARS-CoV-2 by FDA under an Emergency Use Authorization (EUA). This EUA will remain  in effect (meaning this test can be used) for the duration of the COVID-19 declaration under Section 564(b)(1) of the Act, 21 U.S.C.section 360bbb-3(b)(1), unless the authorization is terminated  or revoked sooner.       Influenza A by PCR NEGATIVE NEGATIVE Final   Influenza B by PCR NEGATIVE NEGATIVE Final    Comment: (NOTE) The Xpert Xpress SARS-CoV-2/FLU/RSV plus assay is intended as an aid in the diagnosis of influenza from Nasopharyngeal swab specimens and should not be used as a sole basis for treatment. Nasal washings and aspirates are unacceptable for Xpert Xpress SARS-CoV-2/FLU/RSV testing.  Fact Sheet for Patients: EntrepreneurPulse.com.au  Fact Sheet for Healthcare Providers: IncredibleEmployment.be  This test is not yet approved or cleared by the [place] FDA and has been authorized for detection and/or diagnosis of SARS-CoV-2 by FDA under an Emergency Use Authorization (EUA). This EUA will remain in effect (meaning this test can be used) for the duration of the COVID-19 declaration under Section 564(b)(1) of the Act, 21 U.S.C. section 360bbb-3(b)(1), unless the authorization is terminated or revoked.  Performed at [facility], [place] [address], [place], [place] [address]   Culture, blood (routine x 2)     Status: None (Preliminary result)   Collection Time: 04/24/21  3:18 PM   Specimen: BLOOD  Result Value Ref Range Status   Specimen Description BLOOD RIGHT ANTECUBITAL  Final   Special Requests   Final    BOTTLES DRAWN AEROBIC AND ANAEROBIC Blood Culture adequate volume   Culture   Final    NO GROWTH 3 DAYS Performed at [facility], [place] [address]  [place], [place] [address]    Report Status PENDING  Incomplete  Culture, blood (routine x 2)     Status: None (Preliminary result)   Collection Time: 04/24/21  3:37 PM   Specimen: BLOOD  Result Value Ref Range Status   Specimen Description BLOOD RIGHT ANTECUBITAL  Final   Special Requests   Final    BOTTLES DRAWN AEROBIC AND ANAEROBIC Blood Culture results may not be optimal due to an inadequate volume of blood received in culture bottles   Culture   Final    NO GROWTH 3 DAYS Performed at [facility], [place] [address] [place], [place] [address]    Report Status PENDING  Incomplete  Surgical PCR screen     Status: Abnormal   Collection Time: 04/24/21  3:51 PM   Specimen: Nasal Mucosa; Nasal Swab  Result Value Ref Range Status   MRSA, PCR POSITIVE (A) NEGATIVE Final    Comment: RESULT CALLED TO, READ BACK BY AND VERIFIED WITH: RN M. MARSH [phone] _[phone]  FH    Staphylococcus aureus POSITIVE (A) NEGATIVE Final    Comment: (NOTE) The Xpert SA Assay (FDA approved for NASAL specimens in patients 67 years of age and older), is one component of a comprehensive surveillance program. It is not intended to diagnose infection nor to guide or monitor  treatment. Performed at [facility], [place] [address] [place], [place] [address]   Aerobic/Anaerobic Culture w Gram Stain (surgical/deep wound)     Status: None (Preliminary result)   Collection Time: 04/24/21 10:32 PM   Specimen: Wound; Abscess  Result Value Ref Range Status   Specimen Description WOUND  Final   Special Requests LEFT ANKLE  Final   Gram Stain   Final    MODERATE WBC PRESENT, PREDOMINANTLY PMN FEW GRAM POSITIVE COCCI IN CLUSTERS Performed at [facility], [address], [place], [place] [address]    Culture   Final    FEW METHICILLIN RESISTANT STAPHYLOCOCCUS AUREUS NO ANAEROBES ISOLATED; CULTURE IN PROGRESS FOR 5 DAYS    Report Status PENDING  Incomplete   Organism ID, Bacteria METHICILLIN RESISTANT  STAPHYLOCOCCUS AUREUS  Final      Susceptibility   Methicillin resistant staphylococcus aureus - MIC*    CIPROFLOXACIN >=8 RESISTANT Resistant     ERYTHROMYCIN >=8 RESISTANT Resistant     GENTAMICIN <=0.5 SENSITIVE Sensitive     OXACILLIN >=4 RESISTANT Resistant     TETRACYCLINE >=16 RESISTANT Resistant     VANCOMYCIN <=0.5 SENSITIVE Sensitive     TRIMETH/SULFA >=320 RESISTANT Resistant     CLINDAMYCIN <=0.25 SENSITIVE Sensitive     RIFAMPIN <=0.5 SENSITIVE Sensitive     Inducible Clindamycin NEGATIVE Sensitive     * FEW METHICILLIN RESISTANT STAPHYLOCOCCUS AUREUS     Labs: BNP (last 3 results) Recent Labs    11/16/20 1736  BNP 007.1*   Basic Metabolic Panel: Recent Labs  Lab 04/22/21 1739 04/24/21 0449 04/25/21 0220  NA 133* 135 136  K 4.2 4.1 3.7  CL 100 107 105  CO2 23 21* 22  GLUCOSE 185* 144* 187*  BUN _0 CREATININE 1.13* 0.92 0.78  CALCIUM 8.6* 8.1* 8.1*   Liver Function Tests: Recent Labs  Lab 04/22/21 1739  AST 15  ALT 13  ALKPHOS 87  BILITOT 0.6  PROT 7.9  ALBUMIN 2.2*   No results for input(s): LIPASE, AMYLASE in the last 168 hours. No results for input(s): AMMONIA in the last 168 hours. CBC: Recent Labs  Lab 04/22/21 1739 04/23/21 1241 04/23/21 1530 04/24/21 0449 04/25/21 0220 04/26/21 0206 04/27/21 0626  WBC 13.7*  --   --  9.7 9.1 8.8 10.1  NEUTROABS 12.2*  --   --   --   --   --   --   HGB 9.5*   < > 11.4* 7.9* 7.9* 8.0* 8.1*  HCT 30.9*   < > 37.0 25.6* 26.1* 25.8* 26.9*  MCV 89.3  --   --  88.6 87.9 89.0 89.7  PLT 831*  --   --  630* 643* 650* 689*   < > = values in this interval not displayed.   Cardiac Enzymes: No results for input(s): CKTOTAL, CKMB, CKMBINDEX, TROPONINI in the last 168 hours. BNP: Invalid input(s): POCBNP CBG: Recent Labs  Lab 04/26/21 1232 04/26/21 1557 04/26/21 2113 04/27/21 0818 04/27/21 1303  GLUCAP 94 140* 177* 127* 148*   D-Dimer No results for input(s): DDIMER in the last 72  hours. Hgb A1c No results for input(s): HGBA1C in the last 72 hours. Lipid Profile No results for input(s): CHOL, HDL, LDLCALC, TRIG, CHOLHDL, LDLDIRECT in the last 72 hours. Thyroid function studies No results for input(s): TSH, T4TOTAL, T3FREE, THYROIDAB in the last 72 hours.  Invalid input(s): FREET3 Anemia work up No results for input(s): VITAMINB12, FOLATE,  FERRITIN, TIBC, IRON, RETICCTPCT in the last 72 hours. Urinalysis    Component Value Date/Time   COLORURINE YELLOW 04/23/2021 1305   APPEARANCEUR HAZY (A) 04/23/2021 1305   LABSPEC <1.005 (L) 04/23/2021 1305   PHURINE 5.5 04/23/2021 1305   GLUCOSEU >=500 (A) 04/23/2021 1305   HGBUR NEGATIVE 04/23/2021 1305   [place] 04/23/2021 1305   KETONESUR NEGATIVE 04/23/2021 1305   PROTEINUR NEGATIVE 04/23/2021 1305   NITRITE NEGATIVE 04/23/2021 1305   LEUKOCYTESUR LARGE (A) 04/23/2021 1305   Sepsis Labs Invalid input(s): PROCALCITONIN,  WBC,  LACTICIDVEN Microbiology Recent Results (from the past 240 hour(s))  Blood culture (routine x 2)     Status: Abnormal   Collection Time: 04/22/21  5:33 PM   Specimen: BLOOD  Result Value Ref Range Status   Specimen Description BLOOD SITE NOT SPECIFIED  Final   Special Requests   Final    BOTTLES DRAWN AEROBIC AND ANAEROBIC Blood Culture adequate volume   Culture  Setup Time   Final    GRAM POSITIVE COCCI IN CLUSTERS IN BOTH AEROBIC AND ANAEROBIC BOTTLES CRITICAL RESULT CALLED TO, READ BACK BY AND VERIFIED WITH: W[place] F. [id number] FCP Performed at [facility], [place] [address], [place], [place] [address]    Culture METHICILLIN RESISTANT STAPHYLOCOCCUS AUREUS (A)  Final   Report Status 04/25/2021 FINAL  Final   Organism ID, Bacteria METHICILLIN RESISTANT STAPHYLOCOCCUS AUREUS  Final      Susceptibility   Methicillin resistant staphylococcus aureus - MIC*    CIPROFLOXACIN >=8 RESISTANT Resistant     ERYTHROMYCIN >=8 RESISTANT Resistant     GENTAMICIN <=0.5  SENSITIVE Sensitive     OXACILLIN >=4 RESISTANT Resistant     TETRACYCLINE <=1 SENSITIVE Sensitive     VANCOMYCIN 1 SENSITIVE Sensitive     TRIMETH/SULFA 160 RESISTANT Resistant     CLINDAMYCIN <=0.25 SENSITIVE Sensitive     RIFAMPIN <=0.5 SENSITIVE Sensitive     Inducible Clindamycin NEGATIVE Sensitive     * METHICILLIN RESISTANT STAPHYLOCOCCUS AUREUS  Blood Culture ID Panel (Reflexed)     Status: Abnormal   Collection Time: 04/22/21  5:33 PM  Result Value Ref Range Status   Enterococcus faecalis NOT DETECTED NOT DETECTED Final   Enterococcus Faecium NOT DETECTED NOT DETECTED Final   Listeria monocytogenes NOT DETECTED NOT DETECTED Final   Staphylococcus species DETECTED (A) NOT DETECTED Final    Comment: CRITICAL RESULT CALLED TO, READ BACK BY AND VERIFIED WITH: PHARMD JEREMY F. [id number] FCP    Staphylococcus aureus (BCID) DETECTED (A) NOT DETECTED Final    Comment: Methicillin (oxacillin)-resistant Staphylococcus aureus (MRSA). MRSA is predictably resistant to beta-lactam antibiotics (except ceftaroline). Preferred therapy is vancomycin unless clinically contraindicated. Patient requires contact precautions if  hospitalized. CRITICAL RESULT CALLED TO, READ BACK BY AND VERIFIED WITH: PHARMD JEREMY F. [id number] FCP    Staphylococcus epidermidis NOT DETECTED NOT DETECTED Final   Staphylococcus lugdunensis NOT DETECTED NOT DETECTED Final   Streptococcus species NOT DETECTED NOT DETECTED Final   Streptococcus agalactiae NOT DETECTED NOT DETECTED Final   Streptococcus pneumoniae NOT DETECTED NOT DETECTED Final   Streptococcus pyogenes NOT DETECTED NOT DETECTED Final   A.calcoaceticus-baumannii NOT DETECTED NOT DETECTED Final   Bacteroides fragilis NOT DETECTED NOT DETECTED Final   Enterobacterales NOT DETECTED NOT DETECTED Final   Enterobacter cloacae complex NOT DETECTED NOT DETECTED Final   Escherichia coli NOT DETECTED NOT DETECTED Final   Klebsiella aerogenes NOT DETECTED  NOT DETECTED Final  Klebsiella oxytoca NOT DETECTED NOT DETECTED Final   Klebsiella pneumoniae NOT DETECTED NOT DETECTED Final   Proteus species NOT DETECTED NOT DETECTED Final   Salmonella species NOT DETECTED NOT DETECTED Final   Serratia marcescens NOT DETECTED NOT DETECTED Final   Haemophilus influenzae NOT DETECTED NOT DETECTED Final   Neisseria meningitidis NOT DETECTED NOT DETECTED Final   Pseudomonas aeruginosa NOT DETECTED NOT DETECTED Final   Stenotrophomonas maltophilia NOT DETECTED NOT DETECTED Final   Candida albicans NOT DETECTED NOT DETECTED Final   Candida auris NOT DETECTED NOT DETECTED Final   Candida glabrata NOT DETECTED NOT DETECTED Final   Candida krusei NOT DETECTED NOT DETECTED Final   Candida parapsilosis NOT DETECTED NOT DETECTED Final   Candida tropicalis NOT DETECTED NOT DETECTED Final   Cryptococcus neoformans/gattii NOT DETECTED NOT DETECTED Final   Meth resistant mecA/C and MREJ DETECTED (A) NOT DETECTED Final    Comment: CRITICAL RESULT CALLED TO, READ BACK BY AND VERIFIED WITH: PHARMD JEREMY F. [id number] FCP Performed at [facility], [address] [place], [place] [address]   Resp Panel by RT-PCR (Flu A&B, Covid) Nasopharyngeal Swab     Status: None   Collection Time: 04/23/21  9:08 AM   Specimen: Nasopharyngeal Swab; Nasopharyngeal(NP) swabs in vial transport medium  Result Value Ref Range Status   SARS Coronavirus 2 by RT PCR NEGATIVE NEGATIVE Final    Comment: (NOTE) SARS-CoV-2 target nucleic acids are NOT DETECTED.  The SARS-CoV-2 RNA is generally detectable in upper respiratory specimens during the acute phase of infection. The lowest concentration of SARS-CoV-2 viral copies this assay can detect is 138 copies/mL. A negative result does not preclude SARS-Cov-2 infection and should not be used as the sole basis for treatment or other patient management decisions. A negative result may occur with  improper specimen  collection/handling, submission of specimen other than nasopharyngeal swab, presence of viral mutation(s) within the areas targeted by this assay, and inadequate number of viral copies(<138 copies/mL). A negative result must be combined with clinical observations, patient history, and epidemiological information. The expected result is Negative.  Fact Sheet for Patients:  EntrepreneurPulse.com.au  Fact Sheet for Healthcare Providers:  IncredibleEmployment.be  This test is no t yet approved or cleared by the [place] FDA and  has been authorized for detection and/or diagnosis of SARS-CoV-2 by FDA under an Emergency Use Authorization (EUA). This EUA will remain  in effect (meaning this test can be used) for the duration of the COVID-19 declaration under Section 564(b)(1) of the Act, 21 U.S.C.section 360bbb-3(b)(1), unless the authorization is terminated  or revoked sooner.       Influenza A by PCR NEGATIVE NEGATIVE Final   Influenza B by PCR NEGATIVE NEGATIVE Final    Comment: (NOTE) The Xpert Xpress SARS-CoV-2/FLU/RSV plus assay is intended as an aid in the diagnosis of influenza from Nasopharyngeal swab specimens and should not be used as a sole basis for treatment. Nasal washings and aspirates are unacceptable for Xpert Xpress SARS-CoV-2/FLU/RSV testing.  Fact Sheet for Patients: EntrepreneurPulse.com.au  Fact Sheet for Healthcare Providers: IncredibleEmployment.be  This test is not yet approved or cleared by the [place] FDA and has been authorized for detection and/or diagnosis of SARS-CoV-2 by FDA under an Emergency Use Authorization (EUA). This EUA will remain in effect (meaning this test can be used) for the duration of the COVID-19 declaration under Section 564(b)(1) of the Act, 21 U.S.C. section 360bbb-3(b)(1), unless the authorization is terminated or revoked.  Performed at [facility], [place] [address], [place], [place] [address]   Culture, blood (routine x 2)     Status: None (Preliminary result)   Collection Time: 04/24/21  3:18 PM   Specimen: BLOOD  Result Value Ref Range Status   Specimen Description BLOOD RIGHT ANTECUBITAL  Final   Special Requests   Final    BOTTLES DRAWN AEROBIC AND ANAEROBIC Blood Culture adequate volume   Culture   Final    NO GROWTH 3 DAYS Performed at [facility], [place] [address] [place], [place] [address]    Report Status PENDING  Incomplete  Culture, blood (routine x 2)     Status: None (Preliminary result)   Collection Time: 04/24/21  3:37 PM   Specimen: BLOOD  Result Value Ref Range Status   Specimen Description BLOOD RIGHT ANTECUBITAL  Final   Special Requests   Final    BOTTLES DRAWN AEROBIC AND ANAEROBIC Blood Culture results may not be optimal due to an inadequate volume of blood received in culture bottles   Culture   Final    NO GROWTH 3 DAYS Performed at [facility], [place] [address] [place], [place] [address]    Report Status PENDING  Incomplete  Surgical PCR screen     Status: Abnormal   Collection Time: 04/24/21  3:51 PM   Specimen: Nasal Mucosa; Nasal Swab  Result Value Ref Range Status   MRSA, PCR POSITIVE (A) NEGATIVE Final    Comment: RESULT CALLED TO, READ BACK BY AND VERIFIED WITH: RN M. MARSH [phone] _[phone]  FH    Staphylococcus aureus POSITIVE (A) NEGATIVE Final    Comment: (NOTE) The Xpert SA Assay (FDA approved for NASAL specimens in patients 37 years of age and older), is one component of a comprehensive surveillance program. It is not intended to diagnose infection nor to guide or monitor treatment. Performed at [facility], [place] [address], [place], [place] [address]   Aerobic/Anaerobic Culture w Gram Stain (surgical/deep wound)     Status: None (Preliminary result)   Collection Time: 04/24/21 10:32 PM   Specimen: Wound; Abscess  Result Value Ref Range Status    Specimen Description WOUND  Final   Special Requests LEFT ANKLE  Final   Gram Stain   Final    MODERATE WBC PRESENT, PREDOMINANTLY PMN FEW GRAM POSITIVE COCCI IN CLUSTERS Performed at [facility], [address], [place], [place] [address]    Culture   Final    FEW METHICILLIN RESISTANT STAPHYLOCOCCUS AUREUS NO ANAEROBES ISOLATED; CULTURE IN PROGRESS FOR 5 DAYS    Report Status PENDING  Incomplete   Organism ID, Bacteria METHICILLIN RESISTANT STAPHYLOCOCCUS AUREUS  Final      Susceptibility   Methicillin resistant staphylococcus aureus - MIC*    CIPROFLOXACIN >=8 RESISTANT Resistant     ERYTHROMYCIN >=8 RESISTANT Resistant     GENTAMICIN <=0.5 SENSITIVE Sensitive     OXACILLIN >=4 RESISTANT Resistant     TETRACYCLINE >=16 RESISTANT Resistant     VANCOMYCIN <=0.5 SENSITIVE Sensitive     TRIMETH/SULFA >=320 RESISTANT Resistant     CLINDAMYCIN <=0.25 SENSITIVE Sensitive     RIFAMPIN <=0.5 SENSITIVE Sensitive     Inducible Clindamycin NEGATIVE Sensitive     * FEW METHICILLIN RESISTANT STAPHYLOCOCCUS AUREUS     Time coordinating discharge: Over 30 minutes  SIGNED:   Darliss Cheney, MD  [facility] 04/27/2021, 3:25 PM  If 7PM-7AM, please contact night-coverage  [email or web address]

## 2021-04-29 ENCOUNTER — Encounter (HOSPITAL_COMMUNITY): Payer: Self-pay | Admitting: Cardiology

## 2021-04-29 ENCOUNTER — Ambulatory Visit: Payer: Medicare PPO | Admitting: Orthopedic Surgery

## 2021-04-29 LAB — CULTURE, BLOOD (ROUTINE X 2)
Culture: NO GROWTH
Culture: NO GROWTH
Special Requests: ADEQUATE

## 2021-04-30 LAB — AEROBIC/ANAEROBIC CULTURE W GRAM STAIN (SURGICAL/DEEP WOUND)

## 2021-05-07 ENCOUNTER — Other Ambulatory Visit: Payer: Self-pay

## 2021-05-07 ENCOUNTER — Ambulatory Visit (INDEPENDENT_AMBULATORY_CARE_PROVIDER_SITE_OTHER): Payer: Medicare Other | Admitting: Podiatry

## 2021-05-07 ENCOUNTER — Ambulatory Visit (INDEPENDENT_AMBULATORY_CARE_PROVIDER_SITE_OTHER): Payer: Medicare Other

## 2021-05-07 VITALS — Temp 98.6°F

## 2021-05-07 DIAGNOSIS — Z9889 Other specified postprocedural states: Secondary | ICD-10-CM

## 2021-05-07 DIAGNOSIS — M86172 Other acute osteomyelitis, left ankle and foot: Secondary | ICD-10-CM

## 2021-05-07 NOTE — Progress Notes (Signed)
Subjective: Tammy Good is a 71 y.o. is seen today in office s/p left ankle incision and drainage, bone biopsy, wound debridement preformed on 04/24/2021 with Dr. Cannon Kettle.  States that she has been doing well.  Home health nursing, change the bandage.  She denies any fevers or chills.  No chest pain or shortness of breath.  She still on IV antibiotics.  Objective: General: No acute distress, AAOx3  DP/PT pulses palpable, CRT < 3 sec to all digits.  Left ankle: Incision is well coapted without any evidence of dehiscence with sutures intact.  The area the previous wounds are packed open with endoform packing.  Wound does probe close to bone along the fibula.  Mild edema.  No significant cellulitis noted.  No fluctuation or crepitation. No pain with calf compression, swelling, warmth, erythema.        Assessment and Plan:  Status post left ankle incision and drainage, bone biopsy, doing well with no complications   -Treatment options discussed including all alternatives, risks, and complications -X-rays obtained reviewed.  No evidence of acute osteomyelitis noted today. -Continue antibiotics per infectious disease -Continue every other day dressing changes.  I cleansed the wounds today with saline and endoform was packed followed by dressing.  Betadine has been over the wound. -Also placed referral to the wound care center -[facility] -New surgical shoe dispensed -Pain medication as needed. -Monitor for any clinical signs or symptoms of infection and DVT/PE and directed to call the office immediately should any occur or go to the ER. -Follow-up as scheduled or sooner if any problems arise. In the meantime, encouraged to call the office with any questions, concerns, change in symptoms.   Celesta Gentile, DPM

## 2021-05-09 ENCOUNTER — Encounter: Payer: Self-pay | Admitting: Internal Medicine

## 2021-05-09 ENCOUNTER — Telehealth: Payer: Self-pay

## 2021-05-09 ENCOUNTER — Ambulatory Visit (INDEPENDENT_AMBULATORY_CARE_PROVIDER_SITE_OTHER): Payer: Medicare Other | Admitting: Internal Medicine

## 2021-05-09 ENCOUNTER — Other Ambulatory Visit: Payer: Self-pay

## 2021-05-09 VITALS — BP 132/81 | HR 71 | Temp 98.1°F | Resp 16 | Wt 202.6 lb

## 2021-05-09 DIAGNOSIS — R7881 Bacteremia: Secondary | ICD-10-CM

## 2021-05-09 DIAGNOSIS — M86172 Other acute osteomyelitis, left ankle and foot: Secondary | ICD-10-CM

## 2021-05-09 DIAGNOSIS — B9562 Methicillin resistant Staphylococcus aureus infection as the cause of diseases classified elsewhere: Secondary | ICD-10-CM | POA: Diagnosis not present

## 2021-05-09 DIAGNOSIS — M659 Synovitis and tenosynovitis, unspecified: Secondary | ICD-10-CM

## 2021-05-09 MED ORDER — LINEZOLID 600 MG PO TABS: 600.0000 mg | ORAL_TABLET | Freq: Two times a day (BID) | ORAL | 0 refills | Status: DC

## 2021-05-09 NOTE — Assessment & Plan Note (Signed)
On treatment as above for ostoemyelitis.  Transitioning to linezolid.

## 2021-05-09 NOTE — Progress Notes (Signed)
° °  Subjective:    Patient ID: Tammy Good, female    DOB: 21-Mar-1951, 71 y.o.   MRN: [id number]  HPI Here for hsfu She was hospitalized earlier this month with cellulitis and foot pain and MRI concerning for osteomyelitis of the distal fibula and some tenosynovitis in the area and she was debrided by podiatry.  Cultures from the blood with MRSA and wound culture from the OR also MRSA, though different sensitivity pattern.  She was put on vancomycin IV and remains on this.     Review of Systems  Constitutional:  Negative for fatigue.  Gastrointestinal:  Negative for diarrhea.  Skin:  Negative for rash.      Objective:   Physical Exam Musculoskeletal:     Comments: Left lateral malleolus area dry, no drainage.  Packing in place.  No erythema, no warmth. Sutures in place.  Opening on the lateral foot at the end of the incision.  No drainage or warmth.   Skin:    Findings: No rash.  Neurological:     General: No focal deficit present.     Mental Status: She is alert.   SH: no tobacco       Assessment & Plan:

## 2021-05-09 NOTE — Assessment & Plan Note (Signed)
Repeat blood cultures remained clear.

## 2021-05-09 NOTE — Telephone Encounter (Signed)
Verbal orders given to Amy at [facility]. Per Dr.Comer- pull PICC line after last dose of IV vancomycin on 05/10/2021. Amy repeated verbal orders back  to me and verbalized her understanding.    [place], CMA

## 2021-05-09 NOTE — Patient Instructions (Signed)
Continue with the IV antibiotics until you finish it tomorrow then start on the pill antibiotics on Saturday twice a day for 2 weeks.

## 2021-05-09 NOTE — Assessment & Plan Note (Signed)
Improving now on the vancomycin and no concerns clinically.  At this point, will transition to oral therapy and will use linezolid since it was resistant on the OR swab to tetracycline.  Plan for 2 weeks and will recheck labs and exam at that time.  If doing good, will consider stopping antibiotics after that.

## 2021-05-09 NOTE — Telephone Encounter (Signed)
Thank you :)

## 2021-05-09 NOTE — Telephone Encounter (Signed)
Received call from [place], nurse with [facility] requesting pull PICC orders. Relayed that orders have been given to [facility] to pull PICC after last dose.   Lili: [place], RN

## 2021-05-17 ENCOUNTER — Other Ambulatory Visit: Payer: Self-pay

## 2021-05-17 ENCOUNTER — Ambulatory Visit (INDEPENDENT_AMBULATORY_CARE_PROVIDER_SITE_OTHER): Payer: Medicare Other | Admitting: Podiatry

## 2021-05-17 DIAGNOSIS — Z9889 Other specified postprocedural states: Secondary | ICD-10-CM

## 2021-05-17 DIAGNOSIS — M86172 Other acute osteomyelitis, left ankle and foot: Secondary | ICD-10-CM

## 2021-05-17 DIAGNOSIS — L97323 Non-pressure chronic ulcer of left ankle with necrosis of muscle: Secondary | ICD-10-CM

## 2021-05-20 ENCOUNTER — Encounter: Payer: Self-pay | Admitting: Gastroenterology

## 2021-05-21 ENCOUNTER — Telehealth: Payer: Self-pay

## 2021-05-21 NOTE — Telephone Encounter (Signed)
Patient called complaining of nausea, diarrhea and dizziness since starting the linezolid. Patient would like to know what she should do.  Please advise.

## 2021-05-21 NOTE — Telephone Encounter (Signed)
Patient stated that she will stop medication and follow up Friday.

## 2021-05-23 ENCOUNTER — Ambulatory Visit: Payer: Medicare Other | Admitting: Podiatry

## 2021-05-24 ENCOUNTER — Inpatient Hospital Stay (HOSPITAL_COMMUNITY)
Admission: EM | Admit: 2021-05-24 | Discharge: 2021-06-03 | DRG: 380 | Disposition: A | Discharge status: Home Health | Payer: Medicare Other | Attending: Internal Medicine | Admitting: Internal Medicine

## 2021-05-24 ENCOUNTER — Encounter (HOSPITAL_COMMUNITY): Payer: Self-pay | Admitting: Emergency Medicine

## 2021-05-24 ENCOUNTER — Emergency Department (HOSPITAL_COMMUNITY): Payer: Medicare Other

## 2021-05-24 ENCOUNTER — Encounter: Payer: Self-pay | Admitting: Internal Medicine

## 2021-05-24 ENCOUNTER — Other Ambulatory Visit: Payer: Self-pay

## 2021-05-24 ENCOUNTER — Ambulatory Visit (INDEPENDENT_AMBULATORY_CARE_PROVIDER_SITE_OTHER): Payer: Medicare Other | Admitting: Internal Medicine

## 2021-05-24 ENCOUNTER — Ambulatory Visit: Payer: BC Managed Care – PPO | Admitting: Podiatry

## 2021-05-24 VITALS — BP 92/44 | HR 85 | Temp 97.3°F

## 2021-05-24 DIAGNOSIS — R42 Dizziness and giddiness: Secondary | ICD-10-CM | POA: Diagnosis not present

## 2021-05-24 DIAGNOSIS — D1803 Hemangioma of intra-abdominal structures: Secondary | ICD-10-CM | POA: Diagnosis present

## 2021-05-24 DIAGNOSIS — K219 Gastro-esophageal reflux disease without esophagitis: Secondary | ICD-10-CM | POA: Diagnosis present

## 2021-05-24 DIAGNOSIS — Z7902 Long term (current) use of antithrombotics/antiplatelets: Secondary | ICD-10-CM

## 2021-05-24 DIAGNOSIS — Z6833 Body mass index (BMI) 33.0-33.9, adult: Secondary | ICD-10-CM | POA: Diagnosis not present

## 2021-05-24 DIAGNOSIS — E1165 Type 2 diabetes mellitus with hyperglycemia: Secondary | ICD-10-CM | POA: Diagnosis present

## 2021-05-24 DIAGNOSIS — Z79890 Hormone replacement therapy (postmenopausal): Secondary | ICD-10-CM

## 2021-05-24 DIAGNOSIS — N179 Acute kidney failure, unspecified: Secondary | ICD-10-CM | POA: Diagnosis present

## 2021-05-24 DIAGNOSIS — K2210 Ulcer of esophagus without bleeding: Secondary | ICD-10-CM | POA: Diagnosis not present

## 2021-05-24 DIAGNOSIS — K449 Diaphragmatic hernia without obstruction or gangrene: Secondary | ICD-10-CM | POA: Diagnosis not present

## 2021-05-24 DIAGNOSIS — K429 Umbilical hernia without obstruction or gangrene: Secondary | ICD-10-CM | POA: Diagnosis present

## 2021-05-24 DIAGNOSIS — I110 Hypertensive heart disease with heart failure: Secondary | ICD-10-CM | POA: Diagnosis present

## 2021-05-24 DIAGNOSIS — E871 Hypo-osmolality and hyponatremia: Secondary | ICD-10-CM | POA: Diagnosis present

## 2021-05-24 DIAGNOSIS — D509 Iron deficiency anemia, unspecified: Secondary | ICD-10-CM | POA: Diagnosis present

## 2021-05-24 DIAGNOSIS — M659 Synovitis and tenosynovitis, unspecified: Secondary | ICD-10-CM | POA: Diagnosis not present

## 2021-05-24 DIAGNOSIS — E872 Acidosis, unspecified: Secondary | ICD-10-CM | POA: Diagnosis present

## 2021-05-24 DIAGNOSIS — Z86010 Personal history of colonic polyps: Secondary | ICD-10-CM

## 2021-05-24 DIAGNOSIS — L89152 Pressure ulcer of sacral region, stage 2: Secondary | ICD-10-CM | POA: Diagnosis not present

## 2021-05-24 DIAGNOSIS — Z794 Long term (current) use of insulin: Secondary | ICD-10-CM

## 2021-05-24 DIAGNOSIS — K7689 Other specified diseases of liver: Secondary | ICD-10-CM | POA: Diagnosis present

## 2021-05-24 DIAGNOSIS — R110 Nausea: Secondary | ICD-10-CM

## 2021-05-24 DIAGNOSIS — I739 Peripheral vascular disease, unspecified: Secondary | ICD-10-CM | POA: Diagnosis present

## 2021-05-24 DIAGNOSIS — E039 Hypothyroidism, unspecified: Secondary | ICD-10-CM

## 2021-05-24 DIAGNOSIS — E230 Hypopituitarism: Secondary | ICD-10-CM | POA: Diagnosis present

## 2021-05-24 DIAGNOSIS — R0602 Shortness of breath: Secondary | ICD-10-CM

## 2021-05-24 DIAGNOSIS — I480 Paroxysmal atrial fibrillation: Secondary | ICD-10-CM | POA: Diagnosis present

## 2021-05-24 DIAGNOSIS — Z20822 Contact with and (suspected) exposure to covid-19: Secondary | ICD-10-CM | POA: Diagnosis present

## 2021-05-24 DIAGNOSIS — M86172 Other acute osteomyelitis, left ankle and foot: Secondary | ICD-10-CM | POA: Diagnosis present

## 2021-05-24 DIAGNOSIS — K222 Esophageal obstruction: Secondary | ICD-10-CM | POA: Diagnosis not present

## 2021-05-24 DIAGNOSIS — R531 Weakness: Secondary | ICD-10-CM

## 2021-05-24 DIAGNOSIS — R0902 Hypoxemia: Principal | ICD-10-CM

## 2021-05-24 DIAGNOSIS — E669 Obesity, unspecified: Secondary | ICD-10-CM | POA: Diagnosis present

## 2021-05-24 DIAGNOSIS — Z8614 Personal history of Methicillin resistant Staphylococcus aureus infection: Secondary | ICD-10-CM | POA: Diagnosis not present

## 2021-05-24 DIAGNOSIS — I10 Essential (primary) hypertension: Secondary | ICD-10-CM | POA: Diagnosis not present

## 2021-05-24 DIAGNOSIS — Z7952 Long term (current) use of systemic steroids: Secondary | ICD-10-CM

## 2021-05-24 DIAGNOSIS — Z87891 Personal history of nicotine dependence: Secondary | ICD-10-CM

## 2021-05-24 DIAGNOSIS — K769 Liver disease, unspecified: Secondary | ICD-10-CM | POA: Diagnosis present

## 2021-05-24 DIAGNOSIS — E785 Hyperlipidemia, unspecified: Secondary | ICD-10-CM | POA: Diagnosis present

## 2021-05-24 DIAGNOSIS — D649 Anemia, unspecified: Secondary | ICD-10-CM | POA: Diagnosis present

## 2021-05-24 DIAGNOSIS — Z8249 Family history of ischemic heart disease and other diseases of the circulatory system: Secondary | ICD-10-CM

## 2021-05-24 DIAGNOSIS — E1151 Type 2 diabetes mellitus with diabetic peripheral angiopathy without gangrene: Secondary | ICD-10-CM | POA: Diagnosis present

## 2021-05-24 DIAGNOSIS — E876 Hypokalemia: Secondary | ICD-10-CM

## 2021-05-24 DIAGNOSIS — D62 Acute posthemorrhagic anemia: Secondary | ICD-10-CM | POA: Diagnosis present

## 2021-05-24 DIAGNOSIS — I5022 Chronic systolic (congestive) heart failure: Secondary | ICD-10-CM

## 2021-05-24 DIAGNOSIS — K2211 Ulcer of esophagus with bleeding: Secondary | ICD-10-CM | POA: Diagnosis present

## 2021-05-24 DIAGNOSIS — E860 Dehydration: Secondary | ICD-10-CM | POA: Diagnosis present

## 2021-05-24 DIAGNOSIS — E1100 Type 2 diabetes mellitus with hyperosmolarity without nonketotic hyperglycemic-hyperosmolar coma (NKHHC): Secondary | ICD-10-CM | POA: Diagnosis not present

## 2021-05-24 DIAGNOSIS — E2740 Unspecified adrenocortical insufficiency: Secondary | ICD-10-CM | POA: Diagnosis present

## 2021-05-24 DIAGNOSIS — K922 Gastrointestinal hemorrhage, unspecified: Secondary | ICD-10-CM | POA: Diagnosis present

## 2021-05-24 DIAGNOSIS — I252 Old myocardial infarction: Secondary | ICD-10-CM

## 2021-05-24 DIAGNOSIS — K4090 Unilateral inguinal hernia, without obstruction or gangrene, not specified as recurrent: Secondary | ICD-10-CM | POA: Diagnosis present

## 2021-05-24 DIAGNOSIS — R0600 Dyspnea, unspecified: Secondary | ICD-10-CM

## 2021-05-24 DIAGNOSIS — I5023 Acute on chronic systolic (congestive) heart failure: Secondary | ICD-10-CM | POA: Diagnosis present

## 2021-05-24 DIAGNOSIS — R7989 Other specified abnormal findings of blood chemistry: Secondary | ICD-10-CM | POA: Diagnosis present

## 2021-05-24 DIAGNOSIS — E861 Hypovolemia: Secondary | ICD-10-CM | POA: Diagnosis present

## 2021-05-24 DIAGNOSIS — T45525A Adverse effect of antithrombotic drugs, initial encounter: Secondary | ICD-10-CM | POA: Diagnosis present

## 2021-05-24 DIAGNOSIS — T45515A Adverse effect of anticoagulants, initial encounter: Secondary | ICD-10-CM | POA: Diagnosis present

## 2021-05-24 DIAGNOSIS — I2582 Chronic total occlusion of coronary artery: Secondary | ICD-10-CM | POA: Diagnosis present

## 2021-05-24 DIAGNOSIS — K254 Chronic or unspecified gastric ulcer with hemorrhage: Secondary | ICD-10-CM | POA: Diagnosis not present

## 2021-05-24 DIAGNOSIS — E119 Type 2 diabetes mellitus without complications: Secondary | ICD-10-CM

## 2021-05-24 DIAGNOSIS — Z7901 Long term (current) use of anticoagulants: Secondary | ICD-10-CM

## 2021-05-24 DIAGNOSIS — I951 Orthostatic hypotension: Secondary | ICD-10-CM | POA: Diagnosis present

## 2021-05-24 DIAGNOSIS — I2510 Atherosclerotic heart disease of native coronary artery without angina pectoris: Secondary | ICD-10-CM | POA: Diagnosis present

## 2021-05-24 DIAGNOSIS — Z79899 Other long term (current) drug therapy: Secondary | ICD-10-CM

## 2021-05-24 DIAGNOSIS — L8990 Pressure ulcer of unspecified site, unspecified stage: Secondary | ICD-10-CM | POA: Insufficient documentation

## 2021-05-24 DIAGNOSIS — K5900 Constipation, unspecified: Secondary | ICD-10-CM | POA: Diagnosis not present

## 2021-05-24 DIAGNOSIS — Z91018 Allergy to other foods: Secondary | ICD-10-CM

## 2021-05-24 DIAGNOSIS — Z955 Presence of coronary angioplasty implant and graft: Secondary | ICD-10-CM

## 2021-05-24 LAB — CBC WITH DIFFERENTIAL/PLATELET
Abs Immature Granulocytes: 0.11 10*3/uL — ABNORMAL HIGH (ref 0.00–0.07)
Basophils Absolute: 0.1 10*3/uL (ref 0.0–0.1)
Basophils Relative: 1 %
Eosinophils Absolute: 0.1 10*3/uL (ref 0.0–0.5)
Eosinophils Relative: 1 %
HCT: 19 % — ABNORMAL LOW (ref 36.0–46.0)
Hemoglobin: 5.6 g/dL — CL (ref 12.0–15.0)
Immature Granulocytes: 1 %
Lymphocytes Relative: 10 %
Lymphs Abs: 1.2 10*3/uL (ref 0.7–4.0)
MCH: 26.4 pg (ref 26.0–34.0)
MCHC: 29.5 g/dL — ABNORMAL LOW (ref 30.0–36.0)
MCV: 89.6 fL (ref 80.0–100.0)
Monocytes Absolute: 2 10*3/uL — ABNORMAL HIGH (ref 0.1–1.0)
Monocytes Relative: 17 %
Neutro Abs: 8 10*3/uL — ABNORMAL HIGH (ref 1.7–7.7)
Neutrophils Relative %: 70 %
Platelets: 300 10*3/uL (ref 150–400)
RBC: 2.12 MIL/uL — ABNORMAL LOW (ref 3.87–5.11)
RDW: 19 % — ABNORMAL HIGH (ref 11.5–15.5)
WBC: 11.4 10*3/uL — ABNORMAL HIGH (ref 4.0–10.5)
nRBC: 0 % (ref 0.0–0.2)

## 2021-05-24 LAB — COMPREHENSIVE METABOLIC PANEL
ALT: 13 U/L (ref 0–44)
AST: 15 U/L (ref 15–41)
Albumin: 3.1 g/dL — ABNORMAL LOW (ref 3.5–5.0)
Alkaline Phosphatase: 80 U/L (ref 38–126)
Anion gap: 12 (ref 5–15)
BUN: 28 mg/dL — ABNORMAL HIGH (ref 8–23)
CO2: 20 mmol/L — ABNORMAL LOW (ref 22–32)
Calcium: 8.5 mg/dL — ABNORMAL LOW (ref 8.9–10.3)
Chloride: 100 mmol/L (ref 98–111)
Creatinine, Ser: 1.89 mg/dL — ABNORMAL HIGH (ref 0.44–1.00)
GFR, Estimated: 29 mL/min — ABNORMAL LOW (ref >60)
Glucose, Bld: 167 mg/dL — ABNORMAL HIGH (ref 70–99)
Potassium: 4.2 mmol/L (ref 3.5–5.1)
Sodium: 132 mmol/L — ABNORMAL LOW (ref 135–145)
Total Bilirubin: 0.5 mg/dL (ref 0.3–1.2)
Total Protein: 6.7 g/dL (ref 6.5–8.1)

## 2021-05-24 LAB — TROPONIN I (HIGH SENSITIVITY)
Troponin I (High Sensitivity): 10 ng/L (ref <18)
Troponin I (High Sensitivity): 8 ng/L (ref <18)

## 2021-05-24 LAB — RESP PANEL BY RT-PCR (FLU A&B, COVID) ARPGX2
Influenza A by PCR: NEGATIVE
Influenza B by PCR: NEGATIVE
SARS Coronavirus 2 by RT PCR: NEGATIVE

## 2021-05-24 LAB — CBG MONITORING, ED: Glucose-Capillary: 139 mg/dL — ABNORMAL HIGH (ref 70–99)

## 2021-05-24 LAB — PREPARE RBC (CROSSMATCH)

## 2021-05-24 MED ORDER — LACTATED RINGERS IV BOLUS
1000.0000 mL | Freq: Once | INTRAVENOUS | Status: AC
  Administered 2021-05-24: 1000 mL via INTRAVENOUS

## 2021-05-24 MED ORDER — MELATONIN 3 MG PO TABS
3.0000 mg | ORAL_TABLET | Freq: Every evening | ORAL | Status: DC | PRN
  Administered 2021-05-26 – 2021-06-01 (×4): 3 mg via ORAL
  Filled 2021-05-24 (×4): qty 1

## 2021-05-24 MED ORDER — SODIUM CHLORIDE 0.9 % IV SOLN: 10.0000 mL/h | Freq: Once | INTRAVENOUS | Status: DC

## 2021-05-24 MED ORDER — ATORVASTATIN CALCIUM 40 MG PO TABS
40.0000 mg | ORAL_TABLET | Freq: Every evening | ORAL | Status: DC
  Administered 2021-05-25 – 2021-06-02 (×9): 40 mg via ORAL
  Filled 2021-05-24 (×9): qty 1

## 2021-05-24 MED ORDER — PANTOPRAZOLE SODIUM 40 MG IV SOLR: 40.0000 mg | Freq: Two times a day (BID) | INTRAVENOUS | Status: DC

## 2021-05-24 MED ORDER — LEVOTHYROXINE SODIUM 25 MCG PO TABS
125.0000 ug | ORAL_TABLET | Freq: Every day | ORAL | Status: DC
  Administered 2021-05-25 – 2021-06-03 (×8): 125 ug via ORAL
  Filled 2021-05-24 (×9): qty 1

## 2021-05-24 MED ORDER — ONDANSETRON HCL 4 MG/2ML IJ SOLN
4.0000 mg | Freq: Four times a day (QID) | INTRAMUSCULAR | Status: DC | PRN
  Administered 2021-05-26 – 2021-05-31 (×10): 4 mg via INTRAVENOUS
  Filled 2021-05-24 (×11): qty 2

## 2021-05-24 MED ORDER — INSULIN ASPART 100 UNIT/ML IJ SOLN
0.0000 [IU] | Freq: Three times a day (TID) | INTRAMUSCULAR | Status: DC
  Administered 2021-05-26: 1 [IU] via SUBCUTANEOUS
  Administered 2021-05-27: 2 [IU] via SUBCUTANEOUS
  Administered 2021-05-28 – 2021-05-31 (×3): 1 [IU] via SUBCUTANEOUS
  Administered 2021-06-01 (×2): 3 [IU] via SUBCUTANEOUS
  Administered 2021-06-01: 2 [IU] via SUBCUTANEOUS
  Administered 2021-06-02: 3 [IU] via SUBCUTANEOUS

## 2021-05-24 MED ORDER — ACETAMINOPHEN 325 MG PO TABS
650.0000 mg | ORAL_TABLET | Freq: Four times a day (QID) | ORAL | Status: DC | PRN
  Administered 2021-05-29 – 2021-05-30 (×2): 650 mg via ORAL
  Filled 2021-05-24 (×2): qty 2

## 2021-05-24 MED ORDER — INSULIN ASPART 100 UNIT/ML IJ SOLN
0.0000 [IU] | Freq: Every day | INTRAMUSCULAR | Status: DC
  Administered 2021-06-01 – 2021-06-02 (×2): 3 [IU] via SUBCUTANEOUS

## 2021-05-24 MED ORDER — PANTOPRAZOLE 80MG IVPB - SIMPLE MED
80.0000 mg | Freq: Once | INTRAVENOUS | Status: AC
  Administered 2021-05-24: 80 mg via INTRAVENOUS
  Filled 2021-05-24: qty 80

## 2021-05-24 MED ORDER — OXYCODONE HCL 5 MG PO TABS
5.0000 mg | ORAL_TABLET | Freq: Four times a day (QID) | ORAL | Status: DC | PRN
  Administered 2021-05-30 – 2021-05-31 (×2): 5 mg via ORAL
  Filled 2021-05-24 (×3): qty 1

## 2021-05-24 MED ORDER — LACTATED RINGERS IV SOLN
INTRAVENOUS | Status: AC
Start: 2021-05-24 — End: 2021-05-26

## 2021-05-24 MED ORDER — PANTOPRAZOLE SODIUM 40 MG IV SOLR
40.0000 mg | Freq: Two times a day (BID) | INTRAVENOUS | Status: DC
Start: 2021-05-24 — End: 2021-05-24

## 2021-05-24 MED ORDER — PANTOPRAZOLE INFUSION (NEW) - SIMPLE MED
8.0000 mg/h | INTRAVENOUS | Status: DC
  Administered 2021-05-24 – 2021-05-26 (×4): 8 mg/h via INTRAVENOUS
  Filled 2021-05-24 (×4): qty 80
  Filled 2021-05-24: qty 100

## 2021-05-24 MED ORDER — POLYETHYLENE GLYCOL 3350 17 G PO PACK
17.0000 g | PACK | Freq: Every day | ORAL | Status: DC | PRN
  Administered 2021-05-26: 17 g via ORAL
  Filled 2021-05-24: qty 1

## 2021-05-24 NOTE — ED Provider Triage Note (Signed)
Emergency Medicine Provider Triage Evaluation Note ? ?Brown Good , a 71 y.o. female  was evaluated in triage.  Pt with a pmh of diabetes, hypertension, hyperlipidemia, peripheral artery disease, s/p stent placement 10/2020 presenting with dizziness, sob and palpitations. Says this has been going on for 1 to 2 days.  Says she felt similar when she had her cardiac problems. ? ?Review of Systems  ?Positive: See above ?Negative: Syncope ? ?Physical Exam  ?BP (!) 93/57 (BP Location: Right Arm)   Pulse 89   Temp (!) 97.3 ?F (36.3 ?C) (Oral)   Resp 18   Ht 5\' 7"  (1.702 m)   Wt 99.8 kg   SpO2 100%   BMI 34.46 kg/m?  ?Gen:   Awake, no distress   ?Resp:  Normal effort  ?MSK:   Moves extremities without difficulty  ?Other:  CTAB, auscultation tachycardic, regular rhythm. ? ?Medical Decision Making  ?Medically screening exam initiated at 5:19 PM.  Appropriate orders placed.  Tammy Good was informed that the remainder of the evaluation will be completed by another provider, this initial triage assessment does not replace that evaluation, and the importance of remaining in the ED until their evaluation is complete. ? ?Nurse made aware the patient needs a higher acuity or room sooner than multiple 3 to be due to her history of ACS, hypotension and dizziness/palpitations. ?  ?Tammy Hammock, PA-C ?05/24/21 1828 ? ?

## 2021-05-24 NOTE — ED Provider Notes (Incomplete)
Patient is a 71 year old female with multiple medical problems presenting today after 36-hour history of new onset of shortness of breath palpitations that is present mostly with exertion.  It does seem to improve after she sits down and rests for a while but she is feeling generally weak and worn out.  She denies any chest pain, abdominal pain, nausea vomiting or fevers.  She saw her infectious disease doctor today and the wound on her foot seems to be healing well per her report and per his based on evaluation of external medical records.  When patient arrived in the emergency room she was found to be hypoxic and hypotensive.  This did improve after placing oxygen.  She has no prior history of acute lung disorders.  She does not use inhalers regularly at home.  She has no significant findings for CHF.  EKG today did not show signs of acute ischemia.  However concern for possible NSTEMI, PE as the top 2 causes of her symptoms.  Chest x-ray which I independently visualized and interpreted did not show evidence of fluid overload.

## 2021-05-24 NOTE — Assessment & Plan Note (Signed)
Clinically appears well-healed and no concerns.  Will check inflammatory markers today and if no concerns on labs, will observe off of antibiotics.  ?

## 2021-05-24 NOTE — ED Notes (Signed)
2 failed attempts to collect labs 

## 2021-05-24 NOTE — Progress Notes (Signed)
? ?  Subjective:  ? ? Patient ID: Tammy Good, female    DOB: November 21, 1950, 71 y.o.   MRN: [id number] ? ?HPI ?Here for follow up of osteomyelitis.  ?She was hospitalized last month with cellulitis and foot pain with MRI c/w osteomyelitis and tenosynovitis.  She was treated with over 2 weeks of IV antibiotics and changed to oral linezolid last visit about 2 weeks ago.  Folllow up with podiatry this week without concerns and picture reviewed.  Developed some nausea since starting linezolid and has been off the last several days and still with dizziness and nausea.  Here in a  wheelchair due to dizziness.  ? ? ?Review of Systems  ?Constitutional:  Negative for chills and fever.  ?Gastrointestinal:  Positive for nausea. Negative for vomiting.  ?Neurological:  Positive for dizziness.  ? ?   ?Objective:  ? Physical Exam ?Pulmonary:  ?   Effort: Pulmonary effort is normal.  ?Musculoskeletal:  ?   Comments: Left lateral foot with nicely healing incision area, sutures remain in place.  Two areas still open with packing but no significant drainage.  No surrounding erythema no warmth.   ?Skin: ?   Findings: No rash.  ?Neurological:  ?   Mental Status: She is alert.  ?Psychiatric:     ?   Mood and Affect: Mood normal.  ? ?SH: no tobacco ? ? ? ?   ?Assessment & Plan:  ? ? ?

## 2021-05-24 NOTE — Assessment & Plan Note (Signed)
She has had significant vertigo this week and has not improved since stopping the linezolid.  This also certainly may be related to the linezolid and will remain off of it.  This should improve over the next 2-3 days if related to the medication.  If not, may be a different etiology.  If it persists, I have asked her to let Dr. Forde Dandy know for further evaluation.  I will check some labs, may be dehydration and have asked her to increase her fluid intake.   ?

## 2021-05-24 NOTE — ED Notes (Signed)
The pt is c/o dizziness and sob for 2 days  worse today  she is not on 02 at home  she saw her doctor earlier today for a lt foot problem ?

## 2021-05-24 NOTE — H&P (Addendum)
History and Physical  Tammy Good OHY:[id number] DOB: 1950/12/31 DOA: 05/24/2021  Referring physician: Dr. Greta Doom, [place]  PCP: Tammy Bowen, MD  Outpatient Specialists: Infectious disease, podiatry. Patient coming from: Home via EMS.  Chief Complaint: Generalized weakness x2 days.  HPI: Tammy Good is a 71 y.o. female with medical history significant for left ankle infection, MRSA bacteremia (04/22/21) tenosynovitis of left ankle, status post I&D by podiatry and followed by infectious disease, essential hypertension, hyperlipidemia, type 2 diabetes, iron deficiency anemia, hypothyroidism, paroxysmal A-fib on Eliquis (restarted >4 weeks ago), GERD, history of duodenal ulcers seen on EGD done in 2015 by Dr. Benson Norway, coronary artery disease status post PCI with stent, obesity, who presented from home via EMS with complaints of gradually worsening generalized weakness.  Onset 2 days ago.  Associated with intermittent nausea with no vomiting.  Poor oral intake.  Nausea has been present for at least 6 months, was advised by her PCP to follow-up with GI, has an upcoming appointment.  She denies any melena or hematochezia.  No abdominal pain.  Denies use of NSAIDs.  Denies chest pain, chills or night sweats.  She went to a routine follow-up appointment with infectious disease on the day of presentation.  After she returned home she felt very weak.  EMS was activated.  She was brought into the ED for further evaluation.  Work-up in the ED revealed symptomatic anemia with hemoglobin of 5.6K and AKI with creatinine of 1.89 from baseline of 0.78.  While in the ED her oxygen saturation dropped briefly to 82% on room air, improved to 94% with 3 L nasal cannula.  EDP ordered VQ scan to rule out pulmonary embolism.  The patient reports compliance with Eliquis, DOAC was held for surgery and she restarted it as instructed, more than 4 weeks ago.  TR[facility], hospitalist service, was called to admit.   ED Course:  Temperature 98.3.  BP 113/54, pulse 84, respiratory rate 18, O2 saturation 94% on 3 L.  Lab studies remarkable for serum sodium 132, serum bicarb 20, serum glucose 167, BUN 28, creatinine 1.89, GFR 29, calcium 8.5, albumin 3.1.  WBC 11.4, hemoglobin 5.6, hematocrit 19, MCV 89, platelet 300.  Troponin S 8, 10.  Review of Systems: Review of systems as noted in the HPI. All other systems reviewed and are negative.   Past Medical History:  Diagnosis Date   CAD S/P percutaneous coronary angioplasty 08/2013   100% mRCA - PCI Integrity Resolute DES 3.0 mm x 38 mm - 3.35 mm; PTCA of RPA V 2.0 mm x 15 mm   Cholesteatoma    right   Diabetes mellitus type 2 in obese (HCC)    On insulin and Invokana   History of osteomyelitis L 5th Toe all 05/2019   s/p Partial Ray Amputation with partial closure; 6 wks Abx & LSFA Atherectomy/DEB PTA with Stent for focal dissection.   Hyperlipidemia with target LDL less than 70    Hypothyroidism (acquired)    Mild essential hypertension    Obesity (BMI 30-39.9) 11/17/2013   PAD (peripheral artery disease) ([place]) 05/26/2019   05/26/19: Abd AoGram- BLE runoff -> L SFA orbital atherectomy - PTA w/ DEB & Stent 6 x 40 Luttonix (for focal dissection) - patent Pop A with 3 V runoff. LEA Dopplers 01/03/2020: RABI (prev) 0.68 (0.53)/ RTBI (prev) 0.58 (0.33); LABI (prev) 0.80 (0.64), LTBI (prev) 0.64 (0.51); R mSFA ~50-74%, L mSFA 50-74%. Patent Prox SFA stent < 49% stenosis   Panhypopituitarism ([place])  ST elevation myocardial infarction (STEMI) of inferior wall, subsequent episode of care ([place]) 08/2013   80% branch of D1, 40% mid AV groove circumflex, 100% RCA with subacute thrombus -- thrombus extending into RPA V with 100% occlusion after initial angioplasty of mid RCA ;; Post MI ECHO 6/9/'15: EF 50-55%, mild LVH with moderate HK of inferior wall, Gr1 DD, mild LA dilation; mildly reduced RV function   Past Surgical History:  Procedure Laterality Date   ABDOMINAL AORTOGRAM W/LOWER  EXTREMITY N/A 05/26/2019   Procedure: ABDOMINAL AORTOGRAM W/LOWER EXTREMITY;  Surgeon: Marty Heck, MD;  Location: [place] [facility];  Service: Cardiovascular;  Laterality: N/A;   ABDOMINAL AORTOGRAM W/LOWER EXTREMITY N/A 11/15/2020   Procedure: ABDOMINAL AORTOGRAM W/LOWER EXTREMITY;  Surgeon: Marty Heck, MD;  Location: [place] [facility];  Service: Cardiovascular;  Laterality: N/A;   ABDOMINAL AORTOGRAM W/LOWER EXTREMITY N/A 04/18/2021   Procedure: ABDOMINAL AORTOGRAM W/LOWER EXTREMITY;  Surgeon: Marty Heck, MD;  Location: [place] [facility];  Service: Cardiovascular;  Laterality: N/A;   AMPUTATION Left 05/25/2019   Procedure: AMPUTATION RAY 5th;  Surgeon: Trula Slade, DPM;  Location: [place];  Service: Podiatry;  Laterality: Left;   BONE BIOPSY Left 04/24/2021   Procedure: BONE BIOPSY;  Surgeon: Landis Martins, DPM;  Location: [place];  Service: Podiatry;  Laterality: Left;  left ankle/fibula   Cardiac Event Monitor  July-August 2015   Sinus rhythm with PVCs   COLONOSCOPY N/A 08/31/2013   Procedure: COLONOSCOPY;  Surgeon: Juanita Craver, MD;  Location: [facility] ENDOSCOPY;  Service: Endoscopy;  Laterality: N/A;   CORONARY STENT INTERVENTION N/A 11/19/2020   Procedure: CORONARY STENT INTERVENTION;  Surgeon: Nelva Bush, MD;  Location: [place] [facility];  Service: Cardiovascular;  Laterality: N/A;   ESOPHAGOGASTRODUODENOSCOPY N/A 09/01/2013   Procedure: ESOPHAGOGASTRODUODENOSCOPY (EGD);  Surgeon: Beryle Beams, MD;  Location: [facility] ENDOSCOPY;  Service: Endoscopy;  Laterality: N/A;  bedside   INCISION AND DRAINAGE Left 04/24/2021   Procedure: INCISION AND DRAINAGE;  Surgeon: Landis Martins, DPM;  Location: [place];  Service: Podiatry;  Laterality: Left;   LEFT HEART CATH AND CORONARY ANGIOGRAPHY N/A 11/19/2020   Procedure: LEFT HEART CATH AND CORONARY ANGIOGRAPHY;  Surgeon: Nelva Bush, MD;  Location: [place] [facility];  Service: Cardiovascular;  Laterality: N/A;   LEFT  HEART CATHETERIZATION WITH CORONARY ANGIOGRAM N/A 08/30/2013   Procedure: LEFT HEART CATHETERIZATION WITH CORONARY ANGIOGRAM;  Surgeon: Leonie Man, MD;  Location: [facility] CATH LAB: 100% mRCA (thrombus - extends to RPAV), 80% D1, 40% AVG Cx.   PERCUTANEOUS CORONARY STENT INTERVENTION (PCI-S)  08/30/2013   Procedure: PERCUTANEOUS CORONARY STENT INTERVENTION (PCI-S);  Surgeon: Leonie Man, MD;  Location: [facility] CATH LAB;  Integrity Resolute DES 2.0 mm x 38 mm -- 3.35 mm.; PTCA of proximal RPA V. - 3.0 mm x 15 mm balloon   PERIPHERAL VASCULAR ATHERECTOMY  05/26/2019   Procedure: PERIPHERAL VASCULAR ATHERECTOMY;  Surgeon: Marty Heck, MD;  Location: [place] [facility];  Service: Cardiovascular;;  Left SFA   PERIPHERAL VASCULAR INTERVENTION  05/26/2019   Procedure: PERIPHERAL VASCULAR INTERVENTION;  Surgeon: Marty Heck, MD;  Location: [place] [facility];  Service: Cardiovascular;;  Left SFA   PERIPHERAL VASCULAR INTERVENTION Right 11/15/2020   Procedure: PERIPHERAL VASCULAR INTERVENTION;  Surgeon: Marty Heck, MD;  Location: [place] [facility];  Service: Cardiovascular;  Laterality: Right;  Superficial Femoral Artery   PITUITARY SURGERY     TEE WITHOUT CARDIOVERSION N/A 04/26/2021  Procedure: TRANSESOPHAGEAL ECHOCARDIOGRAM (TEE);  Surgeon: Buford Dresser, MD;  Location: [facility] ENDOSCOPY;  Service: Cardiovascular;  Laterality: N/A;   TRANSTHORACIC ECHOCARDIOGRAM  08/30/2013   mild LVH. EF 50-55%. Moderate HK of the entire inferior myocardium. GR 1 DD. Mild LA dilation. Mildly reduced RV function   TYMPANOMASTOIDECTOMY Right 12/28/2017   Procedure: RIGHT TYMPANOMASTOIDECTOMY;  Surgeon: Leta Baptist, MD;  Location: [place];  Service: ENT;  Laterality: Right;    Social History:  reports that she has quit smoking. She has never used smokeless tobacco. She reports that she does not drink alcohol and does not use drugs.   Allergies  Allergen Reactions    Strawberry Extract Itching, Swelling and Anaphylaxis    Mouth swells and gets itchy    Family History  Problem Relation Age of Onset   Cancer Mother 61       multiple myeloma   Heart attack Father 30   Cancer Sister    Alzheimer's disease Maternal Grandmother       Prior to Admission medications   Medication Sig Start Date End Date Taking? Authorizing Provider  acetaminophen (TYLENOL) 500 MG tablet Take 500-1,000 mg by mouth 3 (three) times daily as needed for headache (pain).   Yes [provider]  apixaban (ELIQUIS) 5 MG TABS tablet Take 1 tablet (5 mg total) by mouth 2 (two) times daily. 11/21/20  Yes Baglia, Corrina, PA-C  atorvastatin (LIPITOR) 40 MG tablet Take 1 tablet (40 mg total) by mouth daily at 6 PM. Patient taking differently: Take 40 mg by mouth every evening. 09/03/13  Yes Cherene Altes, MD  B-D UF III MINI PEN NEEDLES 31G X 5 MM MISC Inject into the skin. 05/28/20  Yes [provider]  clopidogrel (PLAVIX) 75 MG tablet Take 1 tablet by mouth once daily with breakfast Patient taking differently: Take 75 mg by mouth every morning. 10/25/20  Yes Marty Heck, MD  Continuous Blood Gluc Sensor (FREESTYLE LIBRE 2 SENSOR) MISC Apply topically every 14 (fourteen) days. 06/12/20  Yes [provider]  dapagliflozin propanediol (FARXIGA) 10 MG TABS tablet Take 10 mg by mouth every morning.   Yes [provider]  dexamethasone (DECADRON) 0.5 MG tablet Take 0.5 mg by mouth every morning. 09/03/13  Yes Cherene Altes, MD  ferrous sulfate 325 (65 FE) MG tablet Take 325 mg by mouth every evening.   Yes [provider]  insulin aspart (NOVOLOG FLEXPEN) 100 UNIT/ML FlexPen Inject 3-8 Units into the skin See admin instructions. Inject 3-8 units subcutaneously up to three times daily per sliding scale - based on carb count   Yes [provider]  Insulin Glargine (BASAGLAR KWIKPEN) 100 UNIT/ML SOPN Inject 42 Units into the skin at  bedtime. 07/11/15  Yes [provider]  levothyroxine (SYNTHROID) 125 MCG tablet Take 1 tablet (125 mcg total) by mouth daily before breakfast. 11/21/20  Yes Baglia, Corrina, PA-C  losartan (COZAAR) 25 MG tablet Take 1 tablet (25 mg total) by mouth daily. Patient taking differently: Take 25 mg by mouth every morning. 11/21/20  Yes Baglia, Corrina, PA-C  metoprolol succinate (TOPROL-XL) 25 MG 24 hr tablet TAKE 1 TABLET BY MOUTH EVERY MORNING & TAKE 1 TABLET BY MOUTH AT BEDTIME Patient taking differently: 25 mg 2 (two) times daily. 03/04/21  Yes Leonie Man, MD  nitroGLYCERIN (NITROSTAT) 0.4 MG SL tablet Place 1 tablet (0.4 mg total) under the tongue every 5 (five) minutes as needed for chest pain (up to 3  doses. If taking 3rd dose call 911). 11/21/20  Yes Dunn, Dayna N, PA-C  nystatin cream (MYCOSTATIN) Apply 1 application topically 2 (two) times daily. Patient taking differently: Apply 1 application topically 2 (two) times daily as needed (rash). 08/29/19  Yes Carlyle Basques, MD  Omega-3 Fatty Acids (FISH OIL) 1000 MG CAPS Take 1,000 mg by mouth every morning.   Yes [provider]  pantoprazole (PROTONIX) 40 MG tablet Take 1 tablet (40 mg total) by mouth 2 (two) times daily before a meal. 09/03/13  Yes Cherene Altes, MD  prochlorperazine (COMPAZINE) 5 MG tablet Take 1 tablet (5 mg total) by mouth every 8 (eight) hours as needed for nausea or vomiting. 04/19/21  Yes Magnant, Charles L, PA-C  Propylene Glycol (SYSTANE COMPLETE) 0.6 % SOLN Place 1 drop into both eyes daily as needed (dry eyes).   Yes [provider]  spironolactone (ALDACTONE) 25 MG tablet Take 1 tablet (25 mg total) by mouth daily. Patient taking differently: Take 25 mg by mouth every morning. 12/21/20  Yes Lendon Colonel, NP  HYDROcodone-acetaminophen (NORCO) 5-325 MG tablet Take 1 tablet by mouth every 12 (twelve) hours as needed for moderate pain. Patient not taking: Reported on 05/24/2021 04/19/21    Magnant, Gerrianne Scale, PA-C    Physical Exam: BP (!) 113/54    Pulse 84    Temp 98 F (36.7 C)    Resp 18    Ht '5\' 7"'  (1.702 m)    Wt 99.8 kg    SpO2 96%    BMI 34.46 kg/m   General: 71 y.o. year-old female well developed well nourished in no acute distress.  Alert and oriented x3. Cardiovascular: Regular rate and rhythm with no rubs or gallops.  No thyromegaly or JVD noted.  Trace lower extremity edema bilaterally.  Left lower extremity in surgical dressing. Respiratory: Clear to auscultation with no wheezes or rales. Good inspiratory effort. Abdomen: Soft nontender nondistended with normal bowel sounds x4 quadrants. Muskuloskeletal: No cyanosis or clubbing.  Trace lower extremity edema bilaterally.   Neuro: CN II-XII intact, strength, sensation, reflexes Skin: No ulcerative lesions noted or rashes.  Left lower extremity in surgical dressing. Psychiatry: Judgement and insight appear normal. Mood is appropriate for condition and setting          Labs on Admission:  Basic Metabolic Panel: Recent Labs  Lab 05/24/21 1939  NA 132*  K 4.2  CL 100  CO2 20*  GLUCOSE 167*  BUN 28*  CREATININE 1.89*  CALCIUM 8.5*   Liver Function Tests: Recent Labs  Lab 05/24/21 1939  AST 15  ALT 13  ALKPHOS 80  BILITOT 0.5  PROT 6.7  ALBUMIN 3.1*   No results for input(s): LIPASE, AMYLASE in the last 168 hours. No results for input(s): AMMONIA in the last 168 hours. CBC: Recent Labs  Lab 05/24/21 1939  WBC 11.4*  NEUTROABS 8.0*  HGB 5.6*  HCT 19.0*  MCV 89.6  PLT 300   Cardiac Enzymes: No results for input(s): CKTOTAL, CKMB, CKMBINDEX, TROPONINI in the last 168 hours.  BNP (last 3 results) Recent Labs    11/16/20 1736  BNP 217.2*    ProBNP (last 3 results) No results for input(s): PROBNP in the last 8760 hours.  CBG: No results for input(s): GLUCAP in the last 168 hours.  Radiological Exams on Admission: DG Chest 1 View  Result Date: 05/24/2021 CLINICAL DATA:  Nausea  and shortness of breath for the past 2 days. EXAM: CHEST  1 VIEW COMPARISON:  Chest x-ray dated November 21, 2020. FINDINGS: Stable cardiomediastinal silhouette. Chronic mild lingular and left lower lobe atelectasis/scarring. No focal consolidation, pleural effusion, or pneumothorax. No acute osseous abnormality. IMPRESSION: No active disease. Electronically Signed   By: Titus Dubin M.D.   On: 05/24/2021 18:25    EKG: I independently viewed the EKG done and my findings are as followed: Normal sinus rhythm rate of 86.  Nonspecific ST-T changes.  QTc 481.  Assessment/Plan Present on Admission: **None**  Principal Problem:   Generalized weakness  Generalized weakness, suspect multifactorial secondary to acute blood loss anemia, dehydration from poor oral intake. Presented with symptomatic anemia with hemoglobin of 5.6K and creatinine of 1.89, prerenal azotemia.  Endorses poor oral intake with intermittent nausea and no vomiting. 2 units PRBCs ordered to be transfused.  Received 1 L lactated Ringer's in the ED. Treat underlying conditions. PT OT to assess once hemodynamically stable. Fall precautions  Acute blood loss anemia, suspect upper GI bleed with history of multiple duodenal ulcers, severe duodenitis, gastric erosions, seen on EGD done in 2015 by Dr. Benson Norway Hold off home antiplatelets and home DOAC Start Protonix drip GI, Dr. Benson Norway, consulted via secure chat Requested placement of 2 large bore IVs 2 units PRBCs ordered to be transfused FOBT ordered Maintain MAP greater than 65. Repeat CBC, obtain INR in the morning Unable to obtain CTA abdomen/pelvis due to low GFR.  AKI, likely prerenal in the setting of dehydration from poor oral intake  Baseline creatinine 0.7 with GFR greater than 60 Presented with creatinine of 1.89 with GFR of 29 Avoid nephrotoxic agents, dehydration and hypotension Hold off home oral antihypertensives and maintain MAP greater than 65 Start gentle IV fluid  hydration LR 50 cc/h x 2 days Monitor urine output with strict I's and O's. Repeat renal panel in the morning.  Hypovolemic hyponatremia Serum sodium 132 Gentle IV fluid hydration LR at 50 cc/h x 2 days. Repeat renal panel in the morning.  Non-anion gap metabolic acidosis in the setting of acute renal insufficiency Serum bicarb 20, anion gap 12 Continue gentle IV fluid hydration Repeat renal panel in the morning  Type 2 diabetes with hyperglycemia Obtain hemoglobin A1c in the morning Start insulin sliding scale N.p.o. after midnight for possible EGD, defer decision to GI  History of duodenal ulcers, seen on EGD done in 2015 Started on Protonix drip 05/24/2021 Avoid NSAIDs Rest of management per GI  Essential hypertension, BPs are soft Hold off home oral antihypertensives Maintain MAP greater than 65  Hypothyroidism Resume home levothyroxine.  Paroxysmal A-fib Hold off home Eliquis due to presumptive upper GI bleed Rate controlled Monitor on telemetry  Left ankle infection/left ankle tenosynovitis History of MRSA bacteremia (04/22/21) Followed by ID and podiatry Consider ID consult in the morning    DVT prophylaxis: SCDs due to presumptive upper GI bleed.  Code Status: Full code as stated by the patient herself.  Family Communication: Daughter at bedside.  Disposition Plan: Admitted to telemetry medical.  Consults called: GI, Dr. Benson Norway, via secure chat  Admission status: Inpatient status.   Status is: Inpatient Patient requires at least 2 midnights for further evaluation and treatment of present condition.           Kayleen Memos MD [facility] Pager [phone]  If 7PM-7AM, please contact night-coverage [email or web address] Password [email or web address]  05/24/2021, 10:22 PM

## 2021-05-24 NOTE — ED Notes (Signed)
The pt wants the pts acuity increased to a 2 because she has hypertension and shes older ??? ?

## 2021-05-24 NOTE — Assessment & Plan Note (Signed)
This certainly may be related to the linezolid and has been off this for 3 days and has had some improvement in her nausea, now about resolved.  She will remain off the medication. ? ?

## 2021-05-24 NOTE — ED Provider Notes (Signed)
[place] EMERGENCY DEPARTMENT  Provider Note  CSN: [id number] Arrival date & time: 05/24/21 1710  History Chief Complaint  Patient presents with   Shortness of Breath    Tammy Good is a 71 y.o. female who presents today with shortness of breath.  Patient states that she has been having lightheadedness, dizziness, shortness breath for last 2 days.  She is not currently on oxygen at home.  She has recently been treated for osteomyelitis of the left foot.  She is on antibiotics.  Wound has been healing well.  She saw her podiatrist today and there was no changes to her regimen.  However, patient continued to feel short of breath and weak at home.  Came to the emergency department for further evaluation.   Home Medications Prior to Admission medications   Medication Sig Start Date End Date Taking? Authorizing Provider  acetaminophen (TYLENOL) 500 MG tablet Take 500-1,000 mg by mouth 3 (three) times daily as needed for headache (pain).   Yes [provider]  apixaban (ELIQUIS) 5 MG TABS tablet Take 1 tablet (5 mg total) by mouth 2 (two) times daily. 11/21/20  Yes Baglia, Corrina, PA-C  atorvastatin (LIPITOR) 40 MG tablet Take 1 tablet (40 mg total) by mouth daily at 6 PM. Patient taking differently: Take 40 mg by mouth every evening. 09/03/13  Yes Cherene Altes, MD  B-D UF III MINI PEN NEEDLES 31G X 5 MM MISC Inject into the skin. 05/28/20  Yes [provider]  clopidogrel (PLAVIX) 75 MG tablet Take 1 tablet by mouth once daily with breakfast Patient taking differently: Take 75 mg by mouth every morning. 10/25/20  Yes Marty Heck, MD  Continuous Blood Gluc Sensor (FREESTYLE LIBRE 2 SENSOR) MISC Apply topically every 14 (fourteen) days. 06/12/20  Yes [provider]  dapagliflozin propanediol (FARXIGA) 10 MG TABS tablet Take 10 mg by mouth every morning.   Yes [provider]  dexamethasone (DECADRON) 0.5 MG tablet Take 0.5  mg by mouth every morning. 09/03/13  Yes Cherene Altes, MD  ferrous sulfate 325 (65 FE) MG tablet Take 325 mg by mouth every evening.   Yes [provider]  insulin aspart (NOVOLOG FLEXPEN) 100 UNIT/ML FlexPen Inject 3-8 Units into the skin See admin instructions. Inject 3-8 units subcutaneously up to three times daily per sliding scale - based on carb count   Yes [provider]  Insulin Glargine (BASAGLAR KWIKPEN) 100 UNIT/ML SOPN Inject 42 Units into the skin at bedtime. 07/11/15  Yes [provider]  levothyroxine (SYNTHROID) 125 MCG tablet Take 1 tablet (125 mcg total) by mouth daily before breakfast. 11/21/20  Yes Baglia, Corrina, PA-C  losartan (COZAAR) 25 MG tablet Take 1 tablet (25 mg total) by mouth daily. Patient taking differently: Take 25 mg by mouth every morning. 11/21/20  Yes Baglia, Corrina, PA-C  metoprolol succinate (TOPROL-XL) 25 MG 24 hr tablet TAKE 1 TABLET BY MOUTH EVERY MORNING & TAKE 1 TABLET BY MOUTH AT BEDTIME Patient taking differently: 25 mg 2 (two) times daily. 03/04/21  Yes Leonie Man, MD  nitroGLYCERIN (NITROSTAT) 0.4 MG SL tablet Place 1 tablet (0.4 mg total) under the tongue every 5 (five) minutes as needed for chest pain (up to 3 doses. If taking 3rd dose call 911). 11/21/20  Yes Dunn, Dayna N, PA-C  nystatin cream (MYCOSTATIN) Apply 1 application topically 2 (two) times daily. Patient taking differently: Apply 1 application topically 2 (two) times daily as  needed (rash). 08/29/19  Yes Carlyle Basques, MD  Omega-3 Fatty Acids (FISH OIL) 1000 MG CAPS Take 1,000 mg by mouth every morning.   Yes [provider]  pantoprazole (PROTONIX) 40 MG tablet Take 1 tablet (40 mg total) by mouth 2 (two) times daily before a meal. 09/03/13  Yes Cherene Altes, MD  prochlorperazine (COMPAZINE) 5 MG tablet Take 1 tablet (5 mg total) by mouth every 8 (eight) hours as needed for nausea or vomiting. 04/19/21  Yes Magnant, Charles L, PA-C   Propylene Glycol (SYSTANE COMPLETE) 0.6 % SOLN Place 1 drop into both eyes daily as needed (dry eyes).   Yes [provider]  spironolactone (ALDACTONE) 25 MG tablet Take 1 tablet (25 mg total) by mouth daily. Patient taking differently: Take 25 mg by mouth every morning. 12/21/20  Yes Lendon Colonel, NP  HYDROcodone-acetaminophen (NORCO) 5-325 MG tablet Take 1 tablet by mouth every 12 (twelve) hours as needed for moderate pain. Patient not taking: Reported on 05/24/2021 04/19/21   Magnant, Gerrianne Scale, PA-C     Allergies    Strawberry extract   Review of Systems   Review of Systems  Constitutional:  Positive for fatigue. Negative for chills and fever.  HENT:  Negative for ear pain and sore throat.   Eyes:  Negative for pain and visual disturbance.  Respiratory:  Positive for shortness of breath. Negative for cough.   Cardiovascular:  Negative for chest pain and palpitations.  Gastrointestinal:  Negative for abdominal pain and vomiting.  Genitourinary:  Negative for dysuria and hematuria.  Musculoskeletal:  Negative for arthralgias and back pain.  Skin:  Negative for color change and rash.  Neurological:  Positive for weakness. Negative for seizures and syncope.  All other systems reviewed and are negative. Please see HPI for pertinent positives and negatives  Physical Exam BP (!) 113/54    Pulse 84    Temp 98 F (36.7 C)    Resp 18    Ht 5\' 7"  (1.702 m)    Wt 99.8 kg    SpO2 96%    BMI 34.46 kg/m   Physical Exam Vitals and nursing note reviewed.  Constitutional:      General: She is in acute distress.     Appearance: She is well-developed. She is obese. She is ill-appearing and toxic-appearing.  HENT:     Head: Normocephalic and atraumatic.  Eyes:     Conjunctiva/sclera: Conjunctivae normal.  Cardiovascular:     Rate and Rhythm: Normal rate and regular rhythm.     Heart sounds: No murmur heard. Pulmonary:     Effort: Tachypnea and accessory muscle usage  present. No respiratory distress.     Breath sounds: Normal breath sounds.  Abdominal:     Palpations: Abdomen is soft.     Tenderness: There is no abdominal tenderness.  Musculoskeletal:        General: No swelling.     Cervical back: Neck supple.     Right lower leg: No tenderness. No edema.     Left lower leg: No tenderness. Edema present.     Comments: Left foot and walking boot.  Reviewed pictures from clinic today.  Wound is well-healing.  There is no erythema or warmth to the leg.  Skin:    General: Skin is warm and dry.     Capillary Refill: Capillary refill takes less than 2 seconds.  Neurological:     Mental Status: She is alert.  Psychiatric:  Mood and Affect: Mood normal.    ED Results / Procedures / Treatments   EKG EKG Interpretation  Date/Time:  Friday May 24 2021 17:08:13 EST Ventricular Rate:  86 PR Interval:  170 QRS Duration: 102 QT Interval:  402 QTC Calculation: 481 R Axis:   -79 Text Interpretation: Normal sinus rhythm Left axis deviation Low voltage QRS Inferior infarct , age undetermined Anterolateral infarct , age undetermined No significant change since last tracing When compared with ECG of 23-Apr-2021 12:26, PREVIOUS ECG IS PRESENT Confirmed by Blanchie Dessert [phone]) on 05/24/2021 8:12:58 PM  Procedures Procedures  Medications Ordered in the ED Medications  0.9 %  sodium chloride infusion (has no administration in time range)  insulin aspart (novoLOG) injection 0-9 Units (has no administration in time range)  insulin aspart (novoLOG) injection 0-5 Units (has no administration in time range)  pantoprazole (PROTONIX) 80 mg /NS 100 mL IVPB (has no administration in time range)  pantoprozole (PROTONIX) 80 mg /NS 100 mL infusion (has no administration in time range)  pantoprazole (PROTONIX) injection 40 mg (has no administration in time range)  lactated ringers bolus 1,000 mL (0 mLs Intravenous Stopped 05/24/21 2210)     ED Course        MDM   This patient presents to the ED for concern of weakness and sob, this involves an extensive number of treatment options, and is a complaint that carries with it a high risk of complications and morbidity.  The differential diagnosis includes AKI, infection, PE, pneumonia. Patients presentation is complicated by their history of recent osteomyelitis, CAD, PAD, MI s/p DES   Additional history obtained: Additional history obtained from family Records reviewed previous admission documents, Care Everywhere/External Records, and Primary Care Documents  Lab Tests: I Ordered, and personally interpreted labs.  The pertinent results include: Hemoglobin 5.6.  Mildly elevated white blood cell count.  Elevated BUN and creatinine from baseline.  Imaging Studies ordered: I ordered imaging studies including X-ray chest   I independently visualized and interpreted imaging which showed no acute findings I agree with the radiologist interpretation  PE scan was ordered, however given her decreased GFR and elevated creatinine, plan for VQ scan in the morning.  EKG (personally reviewed and interpreted): No acute ischemia.  No STEMI.  No arrhythmias.   Medical Decision Making: Patient here with weakness and shortness of breath.  She was requiring 3 L of oxygen, but she normally requires no oxygen at home.  She has been feeling progressively weaker and more fatigued for the last few days.  She was found to have a hemoglobin of 5.6.  She was crossmatched for blood and transfused.  She has no symptoms of blood loss.  She has had no dark stools, no bloody stools, no blood in her urine.  No recent surgeries since her surgery in early February.  She has continued to take her Eliquis at home.  I did order a PE scan to evaluate for possible blood clot, though it is less likely given that she has been taking her Eliquis.  Given her decrease in her GFR we will plan for VQ scan in the morning.  Patient requires  admission for symptomatic anemia, new oxygen requirement, AKI.  Discussed with the admitting team who accepted for admission.  Complexity of problems addressed: Patients presentation is most consistent with  acute presentation with potential threat to life or bodily function  Disposition: After consideration of the diagnostic results and the patients response to treatment,  I  feel that the patent would benefit from admission to the hospital .   Patient seen in conjunction with my attending, Dr. Maryan Rued.    Final Clinical Impression(s) / ED Diagnoses Final diagnoses:  Hypoxia  SOB (shortness of breath)  AKI (acute kidney injury) ([place])  Symptomatic anemia    Rx / DC Orders ED Discharge Orders     None         Jacelyn Pi, MD 05/24/21 2226    Blanchie Dessert, MD 05/25/21 0002

## 2021-05-24 NOTE — ED Triage Notes (Signed)
Pt transported by [facility] from home for c/o nausea, shob x 2 days, worse with movement. Pt reports no pain only "fluttering" in chest. Pt on RA tor transport to ED.  ? ?

## 2021-05-24 NOTE — Progress Notes (Signed)
Subjective: Tammy Good is a 71 y.o. is seen today in office s/p left ankle incision and drainage, bone biopsy, wound debridement preformed on 04/24/2021 with Dr. Cannon Kettle.  States that she is doing better and the wounds are healing.  She is on antibiotics per infectious disease.  Home health is still been coming out to change the bandage.  She does still get some swelling and drainage but she feels that the drainage has improved.  No purulence that she has noted.  Currently denies any fevers or chills.  No chest pain or shortness of breath.   Objective: General: No acute distress, AAOx3  DP/PT pulses palpable, CRT < 3 sec to all digits.  Left ankle: Incision is well coapted without any evidence of dehiscence with sutures intact.  The area the previous wounds are packed open with iodoform packing.  Wound does probe close to bone along the fibula.  The wounds are filling in.  There is mild edema.  No significant cellulitis noted.  No fluctuation or crepitation. No pain with calf compression, swelling, warmth, erythema.         Assessment and Plan:  Status post left ankle incision and drainage, bone biopsy, doing well with no complications   -Treatment options discussed including all alternatives, risks, and complications -I did clean the wounds today with saline and they were repacked with iodoform packing. -Continue antibiotics per infectious disease -Continue every other day dressing changes.  I cleansed the wounds today with saline and endoform was packed followed by dressing.  Betadine has been over the wound. -Wound care referral has been placed and she has an appointment scheduled. -Elevation -Continue with surgical shoe. -Pain medication as needed. -Monitor for any clinical signs or symptoms of infection and DVT/PE and directed to call the office immediately should any occur or go to the ER. -Follow-up as scheduled or sooner if any problems arise. In the meantime, encouraged to  call the office with any questions, concerns, change in symptoms.   Possible suture removal next appointment  Celesta Gentile, DPM

## 2021-05-24 NOTE — Assessment & Plan Note (Signed)
As above, infection appears resolved and will observe off of antibiotics.   ?Follow up as needed ?

## 2021-05-25 ENCOUNTER — Inpatient Hospital Stay (HOSPITAL_COMMUNITY): Payer: Medicare Other

## 2021-05-25 DIAGNOSIS — R531 Weakness: Secondary | ICD-10-CM | POA: Diagnosis not present

## 2021-05-25 LAB — COMPREHENSIVE METABOLIC PANEL
AG Ratio: 1.1 (calc) (ref 1.0–2.5)
ALT: 7 U/L (ref 6–29)
AST: 8 U/L — ABNORMAL LOW (ref 10–35)
Albumin: 3.4 g/dL — ABNORMAL LOW (ref 3.6–5.1)
Alkaline phosphatase (APISO): 86 U/L (ref 37–153)
BUN/Creatinine Ratio: 23 (calc) — ABNORMAL HIGH (ref 6–22)
BUN: 26 mg/dL — ABNORMAL HIGH (ref 7–25)
CO2: 20 mmol/L (ref 20–32)
Calcium: 8.5 mg/dL — ABNORMAL LOW (ref 8.6–10.4)
Chloride: 99 mmol/L (ref 98–110)
Creat: 1.12 mg/dL — ABNORMAL HIGH (ref 0.50–1.05)
Globulin: 3.1 g/dL (calc) (ref 1.9–3.7)
Glucose, Bld: 206 mg/dL — ABNORMAL HIGH (ref 65–99)
Potassium: 4.1 mmol/L (ref 3.5–5.3)
Sodium: 134 mmol/L — ABNORMAL LOW (ref 135–146)
Total Bilirubin: 0.5 mg/dL (ref 0.2–1.2)
Total Protein: 6.5 g/dL (ref 6.1–8.1)

## 2021-05-25 LAB — URINALYSIS, ROUTINE W REFLEX MICROSCOPIC
Bilirubin Urine: NEGATIVE
Glucose, UA: >=500 mg/dL — AB
Hgb urine dipstick: NEGATIVE
Ketones, ur: NEGATIVE mg/dL
Nitrite: NEGATIVE
Protein, ur: NEGATIVE mg/dL
Specific Gravity, Urine: 1.011 (ref 1.005–1.030)
pH: 5 (ref 5.0–8.0)

## 2021-05-25 LAB — CBG MONITORING, ED
Glucose-Capillary: 95 mg/dL (ref 70–99)
Glucose-Capillary: 77 mg/dL (ref 70–99)

## 2021-05-25 LAB — CBC WITH DIFFERENTIAL/PLATELET
Absolute Monocytes: 1339 cells/uL — ABNORMAL HIGH (ref 200–950)
Basophils Absolute: 47 cells/uL (ref 0–200)
Basophils Relative: 0.5 %
Eosinophils Absolute: 112 cells/uL (ref 15–500)
Eosinophils Relative: 1.2 %
HCT: 19.2 % — ABNORMAL LOW (ref 35.0–45.0)
Hemoglobin: 5.7 g/dL — CL (ref 11.7–15.5)
Lymphs Abs: 1451 cells/uL (ref 850–3900)
MCH: 25.8 pg — ABNORMAL LOW (ref 27.0–33.0)
MCHC: 29.7 g/dL — ABNORMAL LOW (ref 32.0–36.0)
MCV: 86.9 fL (ref 80.0–100.0)
MPV: 9.7 fL (ref 7.5–12.5)
Monocytes Relative: 14.4 %
Neutro Abs: 6352 cells/uL (ref 1500–7800)
Neutrophils Relative %: 68.3 %
Platelets: 313 10*3/uL (ref 140–400)
RBC: 2.21 10*6/uL — ABNORMAL LOW (ref 3.80–5.10)
RDW: 17.5 % — ABNORMAL HIGH (ref 11.0–15.0)
Total Lymphocyte: 15.6 %
WBC: 9.3 10*3/uL (ref 3.8–10.8)

## 2021-05-25 LAB — SEDIMENTATION RATE: Sed Rate: 25 mm/h (ref 0–30)

## 2021-05-25 LAB — GLUCOSE, CAPILLARY
Glucose-Capillary: 83 mg/dL (ref 70–99)
Glucose-Capillary: 144 mg/dL — ABNORMAL HIGH (ref 70–99)

## 2021-05-25 LAB — C-REACTIVE PROTEIN: CRP: 27.9 mg/L — ABNORMAL HIGH (ref <8.0)

## 2021-05-25 LAB — HEMOGLOBIN AND HEMATOCRIT, BLOOD
HCT: 25.9 % — ABNORMAL LOW (ref 36.0–46.0)
Hemoglobin: 8.5 g/dL — ABNORMAL LOW (ref 12.0–15.0)

## 2021-05-25 MED ORDER — TECHNETIUM TO 99M ALBUMIN AGGREGATED
4.0000 | Freq: Once | INTRAVENOUS | Status: AC | PRN
  Administered 2021-05-25: 4 via INTRAVENOUS

## 2021-05-25 NOTE — Progress Notes (Addendum)
PT Cancellation Note ? ?Patient Details ?Name: Tammy Good ?MRN: [id number] ?DOB: October 12, 1950 ? ? ?Cancelled Treatment:    Reason Eval/Treat Not Completed: Medical issues which prohibited therapy. Sent to nuclear medicine.  Retry as time and pt allow. ? ? ?[name] ?05/25/2021, 10:20 AM ? ?Mee Hives, PT PhD ?Acute Rehab Dept. Number: [facility] [phone] and [place] [phone] ? ?

## 2021-05-25 NOTE — ED Notes (Signed)
Pt transported to Nuc Med. 

## 2021-05-25 NOTE — ED Notes (Signed)
Pt repositioned in bed. Call light within reach. ?

## 2021-05-25 NOTE — Progress Notes (Signed)
OT Cancellation Note ? ?Patient Details ?Name: Tammy Good ?MRN: [id number] ?DOB: Aug 03, 1950 ? ? ?Cancelled Treatment:    Reason Eval/Treat Not Completed: Patient not medically ready.  Hbg 5.6.  Will reattempt when medically appropriate.  ? ?Velvet Moomaw C., OTR/L ?Acute Rehabilitation Services ?Pager [phone] ?Office [phone] ? ? ?[name] M ?05/25/2021, 12:34 PM ?

## 2021-05-25 NOTE — H&P (View-Only) (Signed)
Reason for Consult: Anemia Referring Physician: [facility]  Keshara Blanchard Kelch HPI: This is a 71 year old female with a PMH of superficial gastric antral and duodenal bulb ulcerations (2015), hiatal hernia, CAD, HTN, PAD, DM, paroxysmal A-fib on Eliquis, and STEMI (2015) admitted for complaints of weakness.  Her symptoms gradually progressed and in the ER she was identified to have an HGB of 5.6 g/dL.  She denied any gross evidence of bleeding with melena, hematochezia, hematemesis, hematuria, or hemoptysis.  There was also the finding of AKI and her creatinine increased from a baseline of 0.7 - 0.9 to the admission value of 1.89.  A CRP was checked and it was noted to be at 27.9.  She is currently being treated for an MRSA infection in her left ankle.  A recent hemoccult on 04/23/2021 was negative.  Past Medical History:  Diagnosis Date   CAD S/P percutaneous coronary angioplasty 08/2013   100% mRCA - PCI Integrity Resolute DES 3.0 mm x 38 mm - 3.35 mm; PTCA of RPA V 2.0 mm x 15 mm   Cholesteatoma    right   Diabetes mellitus type 2 in obese (HCC)    On insulin and Invokana   History of osteomyelitis L 5th Toe all 05/2019   s/p Partial Ray Amputation with partial closure; 6 wks Abx & LSFA Atherectomy/DEB PTA with Stent for focal dissection.   Hyperlipidemia with target LDL less than 70    Hypothyroidism (acquired)    Mild essential hypertension    Obesity (BMI 30-39.9) 11/17/2013   PAD (peripheral artery disease) ([place]) 05/26/2019   05/26/19: Abd AoGram- BLE runoff -> L SFA orbital atherectomy - PTA w/ DEB & Stent 6 x 40 Luttonix (for focal dissection) - patent Pop A with 3 V runoff. LEA Dopplers 01/03/2020: RABI (prev) 0.68 (0.53)/ RTBI (prev) 0.58 (0.33); LABI (prev) 0.80 (0.64), LTBI (prev) 0.64 (0.51); R mSFA ~50-74%, L mSFA 50-74%. Patent Prox SFA stent < 49% stenosis   Panhypopituitarism (HCC)    ST elevation myocardial infarction (STEMI) of inferior wall, subsequent episode of care  ([place]) 08/2013   80% branch of D1, 40% mid AV groove circumflex, 100% RCA with subacute thrombus -- thrombus extending into RPA V with 100% occlusion after initial angioplasty of mid RCA ;; Post MI ECHO 6/9/'15: EF 50-55%, mild LVH with moderate HK of inferior wall, Gr1 DD, mild LA dilation; mildly reduced RV function    Past Surgical History:  Procedure Laterality Date   ABDOMINAL AORTOGRAM W/LOWER EXTREMITY N/A 05/26/2019   Procedure: ABDOMINAL AORTOGRAM W/LOWER EXTREMITY;  Surgeon: Marty Heck, MD;  Location: [place] [facility];  Service: Cardiovascular;  Laterality: N/A;   ABDOMINAL AORTOGRAM W/LOWER EXTREMITY N/A 11/15/2020   Procedure: ABDOMINAL AORTOGRAM W/LOWER EXTREMITY;  Surgeon: Marty Heck, MD;  Location: [place] [facility];  Service: Cardiovascular;  Laterality: N/A;   ABDOMINAL AORTOGRAM W/LOWER EXTREMITY N/A 04/18/2021   Procedure: ABDOMINAL AORTOGRAM W/LOWER EXTREMITY;  Surgeon: Marty Heck, MD;  Location: [place] [facility];  Service: Cardiovascular;  Laterality: N/A;   AMPUTATION Left 05/25/2019   Procedure: AMPUTATION RAY 5th;  Surgeon: Trula Slade, DPM;  Location: [place];  Service: Podiatry;  Laterality: Left;   BONE BIOPSY Left 04/24/2021   Procedure: BONE BIOPSY;  Surgeon: Landis Martins, DPM;  Location: [place];  Service: Podiatry;  Laterality: Left;  left ankle/fibula   Cardiac Event Monitor  July-August 2015   Sinus rhythm with PVCs   COLONOSCOPY N/A 08/31/2013  Procedure: COLONOSCOPY;  Surgeon: Juanita Craver, MD;  Location: [facility] ENDOSCOPY;  Service: Endoscopy;  Laterality: N/A;   CORONARY STENT INTERVENTION N/A 11/19/2020   Procedure: CORONARY STENT INTERVENTION;  Surgeon: Nelva Bush, MD;  Location: [place] [facility];  Service: Cardiovascular;  Laterality: N/A;   ESOPHAGOGASTRODUODENOSCOPY N/A 09/01/2013   Procedure: ESOPHAGOGASTRODUODENOSCOPY (EGD);  Surgeon: Beryle Beams, MD;  Location: [facility] END[facility]COPY;  Service: Endoscopy;  Laterality:  N/A;  bedside   INCISION AND DRAINAGE Left 04/24/2021   Procedure: INCISION AND DRAINAGE;  Surgeon: Landis Martins, DPM;  Location: [place];  Service: Podiatry;  Laterality: Left;   LEFT HEART CATH AND CORONARY ANGIOGRAPHY N/A 11/19/2020   Procedure: LEFT HEART CATH AND CORONARY ANGIOGRAPHY;  Surgeon: Nelva Bush, MD;  Location: [place] [facility];  Service: Cardiovascular;  Laterality: N/A;   LEFT HEART CATHETERIZATION WITH CORONARY ANGIOGRAM N/A 08/30/2013   Procedure: LEFT HEART CATHETERIZATION WITH CORONARY ANGIOGRAM;  Surgeon: Leonie Man, MD;  Location: [facility] CATH LAB: 100% mRCA (thrombus - extends to RPAV), 80% D1, 40% AVG Cx.   PERCUTANEOUS CORONARY STENT INTERVENTION (PCI-S)  08/30/2013   Procedure: PERCUTANEOUS CORONARY STENT INTERVENTION (PCI-S);  Surgeon: Leonie Man, MD;  Location: [facility] CATH LAB;  Integrity Resolute DES 2.0 mm x 38 mm -- 3.35 mm.; PTCA of proximal RPA V. - 3.0 mm x 15 mm balloon   PERIPHERAL VASCULAR ATHERECTOMY  05/26/2019   Procedure: PERIPHERAL VASCULAR ATHERECTOMY;  Surgeon: Marty Heck, MD;  Location: [place] [facility];  Service: Cardiovascular;;  Left SFA   PERIPHERAL VASCULAR INTERVENTION  05/26/2019   Procedure: PERIPHERAL VASCULAR INTERVENTION;  Surgeon: Marty Heck, MD;  Location: [place] [facility];  Service: Cardiovascular;;  Left SFA   PERIPHERAL VASCULAR INTERVENTION Right 11/15/2020   Procedure: PERIPHERAL VASCULAR INTERVENTION;  Surgeon: Marty Heck, MD;  Location: [place] [facility];  Service: Cardiovascular;  Laterality: Right;  Superficial Femoral Artery   PITUITARY SURGERY     TEE WITHOUT CARDIOVERSION N/A 04/26/2021   Procedure: TRANSESOPHAGEAL ECHOCARDIOGRAM (TEE);  Surgeon: Buford Dresser, MD;  Location: [facility] ENDOSCOPY;  Service: Cardiovascular;  Laterality: N/A;   TRANSTHORACIC ECHOCARDIOGRAM  08/30/2013   mild LVH. EF 50-55%. Moderate HK of the entire inferior myocardium. GR 1 DD. Mild LA dilation. Mildly  reduced RV function   TYMPANOMASTOIDECTOMY Right 12/28/2017   Procedure: RIGHT TYMPANOMASTOIDECTOMY;  Surgeon: Leta Baptist, MD;  Location: [place];  Service: ENT;  Laterality: Right;    Family History  Problem Relation Age of Onset   Cancer Mother 62       multiple myeloma   Heart attack Father 62   Cancer Sister    Alzheimer's disease Maternal Grandmother     Social History:  reports that she has quit smoking. She has never used smokeless tobacco. She reports that she does not drink alcohol and does not use drugs.  Allergies:  Allergies  Allergen Reactions   Strawberry Extract Itching, Swelling and Anaphylaxis    Mouth swells and gets itchy    Medications: Scheduled:  atorvastatin  40 mg Oral QPM   insulin aspart  0-5 Units Subcutaneous QHS   insulin aspart  0-9 Units Subcutaneous TID WC   levothyroxine  125 mcg Oral QAC breakfast   [START ON 05/28/2021] pantoprazole  40 mg Intravenous Q12H   Continuous:  sodium chloride     lactated ringers 50 mL/hr at 05/25/21 0427   pantoprazole 8 mg/hr (05/25/21 0814)    Results for orders placed  or performed during the hospital encounter of 05/24/21 (from the past 24 hour(s))  Resp Panel by RT-PCR (Flu A&B, Covid) Nasopharyngeal Swab     Status: None   Collection Time: 05/24/21  5:12 PM   Specimen: Nasopharyngeal Swab; Nasopharyngeal(NP) swabs in vial transport medium  Result Value Ref Range   SARS Coronavirus 2 by RT PCR NEGATIVE NEGATIVE   Influenza A by PCR NEGATIVE NEGATIVE   Influenza B by PCR NEGATIVE NEGATIVE  Comprehensive metabolic panel     Status: Abnormal   Collection Time: 05/24/21  7:39 PM  Result Value Ref Range   Sodium 132 (L) 135 - 145 mmol/L   Potassium 4.2 3.5 - 5.1 mmol/L   Chloride 100 98 - 111 mmol/L   CO2 20 (L) 22 - 32 mmol/L   Glucose, Bld 167 (H) 70 - 99 mg/dL   BUN 28 (H) 8 - 23 mg/dL   Creatinine, Ser 1.89 (H) 0.44 - 1.00 mg/dL   Calcium 8.5 (L) 8.9 - 10.3 mg/dL   Total Protein 6.7  6.5 - 8.1 g/dL   Albumin 3.1 (L) 3.5 - 5.0 g/dL   AST 15 15 - 41 U/L   ALT 13 0 - 44 U/L   Alkaline Phosphatase 80 38 - 126 U/L   Total Bilirubin 0.5 0.3 - 1.2 mg/dL   GFR, Estimated 29 (L) >60 mL/min   Anion gap 12 5 - 15  CBC with Differential     Status: Abnormal   Collection Time: 05/24/21  7:39 PM  Result Value Ref Range   WBC 11.4 (H) 4.0 - 10.5 K/uL   RBC 2.12 (L) 3.87 - 5.11 MIL/uL   Hemoglobin 5.6 (LL) 12.0 - 15.0 g/dL   HCT 19.0 (L) 36.0 - 46.0 %   MCV 89.6 80.0 - 100.0 fL   MCH 26.4 26.0 - 34.0 pg   MCHC 29.5 (L) 30.0 - 36.0 g/dL   RDW 19.0 (H) 11.5 - 15.5 %   Platelets 300 150 - 400 K/uL   nRBC 0.0 0.0 - 0.2 %   Neutrophils Relative % 70 %   Neutro Abs 8.0 (H) 1.7 - 7.7 K/uL   Lymphocytes Relative 10 %   Lymphs Abs 1.2 0.7 - 4.0 K/uL   Monocytes Relative 17 %   Monocytes Absolute 2.0 (H) 0.1 - 1.0 K/uL   Eosinophils Relative 1 %   Eosinophils Absolute 0.1 0.0 - 0.5 K/uL   Basophils Relative 1 %   Basophils Absolute 0.1 0.0 - 0.1 K/uL   Immature Granulocytes 1 %   Abs Immature Granulocytes 0.11 (H) 0.00 - 0.07 K/uL  Troponin I (High Sensitivity)     Status: None   Collection Time: 05/24/21  7:39 PM  Result Value Ref Range   Troponin I (High Sensitivity) 10 <18 ng/L  Troponin I (High Sensitivity)     Status: None   Collection Time: 05/24/21  8:19 PM  Result Value Ref Range   Troponin I (High Sensitivity) 8 <18 ng/L  Type and screen [facility]     Status: None (Preliminary result)   Collection Time: 05/24/21  8:30 PM  Result Value Ref Range   ABO/RH(D) O POS    Antibody Screen NEG    Sample Expiration 05/27/2021,2359    Unit Number [id number]    Blood Component Type RED CELLS,LR    Unit division 00    Status of Unit ISSUED    Transfusion Status OK TO TRANSFUSE    Crossmatch Result  Compatible Performed at [facility], [place] [address], [place], [place] [address]    Unit Number [id number]    Blood Component Type RED  CELLS,LR    Unit division 00    Status of Unit ISSUED    Transfusion Status OK TO TRANSFUSE    Crossmatch Result Compatible   Prepare RBC (crossmatch)     Status: None   Collection Time: 05/24/21  8:30 PM  Result Value Ref Range   Order Confirmation      ORDER PROCESSED BY BLOOD BANK Performed at [facility], [place] [address] [place], [place] [address]   POC CBG, ED     Status: Abnormal   Collection Time: 05/24/21 11:55 PM  Result Value Ref Range   Glucose-Capillary 139 (H) 70 - 99 mg/dL  Urinalysis, Routine w reflex microscopic     Status: Abnormal   Collection Time: 05/25/21 12:00 AM  Result Value Ref Range   Color, Urine YELLOW YELLOW   APPearance HAZY (A) CLEAR   Specific Gravity, Urine 1.011 1.005 - 1.030   pH 5.0 5.0 - 8.0   Glucose, UA >=500 (A) NEGATIVE mg/dL   Hgb urine dipstick NEGATIVE NEGATIVE   Bilirubin Urine NEGATIVE NEGATIVE   Ketones, ur NEGATIVE NEGATIVE mg/dL   Protein, ur NEGATIVE NEGATIVE mg/dL   Nitrite NEGATIVE NEGATIVE   Leukocytes,Ua TRACE (A) NEGATIVE   RBC / HPF 0-5 0 - 5 RBC/hpf   WBC, UA 0-5 0 - 5 WBC/hpf   Bacteria, UA RARE (A) NONE SEEN   Squamous Epithelial / LPF 0-5 0 - 5   Mucus PRESENT    Hyaline Casts, UA PRESENT      DG Chest 1 View  Result Date: 05/24/2021 CLINICAL DATA:  Nausea and shortness of breath for the past 2 days. EXAM: CHEST  1 VIEW COMPARISON:  Chest x-ray dated November 21, 2020. FINDINGS: Stable cardiomediastinal silhouette. Chronic mild lingular and left lower lobe atelectasis/scarring. No focal consolidation, pleural effusion, or pneumothorax. No acute osseous abnormality. IMPRESSION: No active disease. Electronically Signed   By: Titus Dubin M.D.   On: 05/24/2021 18:25    ROS:  As stated above in the HPI otherwise negative.  Blood pressure (!) 103/44, pulse 65, temperature 98.5 F (36.9 C), temperature source Oral, resp. rate 14, height _0  (1.702 m), weight 99.8 kg, SpO2 100 %.    PE: Gen: NAD, Alert and  Oriented HEENT:  [place]/AT, EOMI Neck: Supple, no LAD Lungs: CTA Bilaterally CV: RRR without M/G/R ABD: Soft, NTND, +BS Ext: No C/C/E  Assessment/Plan: 1) Anemia. 2) AKI. 3) Left ankle MRSA. 4) Weakness.   The patient also had a colonoscopy on 03/10/2019 with findings of a small ascending colon adenoma.  With her anticoagulation, an EGD will be performed to look for any acute bleeding sites.  She has a history of PUD, but the 2020 evaluation was negative.  Plan: 1) EGD tomorrow and further recommendations pending the findings. 2) Follow HGB. 3) Agree with transfusions.  Donavin Audino D 05/25/2021, 7:44 AM

## 2021-05-25 NOTE — Consult Note (Signed)
Reason for Consult: Anemia Referring Physician: [facility]  Tammy Good HPI: This is a 71 year old female with a PMH of superficial gastric antral and duodenal bulb ulcerations (2015), hiatal hernia, CAD, HTN, PAD, DM, paroxysmal A-fib on Eliquis, and STEMI (2015) admitted for complaints of weakness.  Her symptoms gradually progressed and in the ER she was identified to have an HGB of 5.6 g/dL.  She denied any gross evidence of bleeding with melena, hematochezia, hematemesis, hematuria, or hemoptysis.  There was also the finding of AKI and her creatinine increased from a baseline of 0.7 - 0.9 to the admission value of 1.89.  A CRP was checked and it was noted to be at 27.9.  She is currently being treated for an MRSA infection in her left ankle.  A recent hemoccult on 04/23/2021 was negative.  Past Medical History:  Diagnosis Date   CAD S/P percutaneous coronary angioplasty 08/2013   100% mRCA - PCI Integrity Resolute DES 3.0 mm x 38 mm - 3.35 mm; PTCA of RPA V 2.0 mm x 15 mm   Cholesteatoma    right   Diabetes mellitus type 2 in obese (HCC)    On insulin and Invokana   History of osteomyelitis L 5th Toe all 05/2019   s/p Partial Ray Amputation with partial closure; 6 wks Abx & LSFA Atherectomy/DEB PTA with Stent for focal dissection.   Hyperlipidemia with target LDL less than 70    Hypothyroidism (acquired)    Mild essential hypertension    Obesity (BMI 30-39.9) 11/17/2013   PAD (peripheral artery disease) ([place]) 05/26/2019   05/26/19: Abd AoGram- BLE runoff -> L SFA orbital atherectomy - PTA w/ DEB & Stent 6 x 40 Luttonix (for focal dissection) - patent Pop A with 3 V runoff. LEA Dopplers 01/03/2020: RABI (prev) 0.68 (0.53)/ RTBI (prev) 0.58 (0.33); LABI (prev) 0.80 (0.64), LTBI (prev) 0.64 (0.51); R mSFA ~50-74%, L mSFA 50-74%. Patent Prox SFA stent < 49% stenosis   Panhypopituitarism (HCC)    ST elevation myocardial infarction (STEMI) of inferior wall, subsequent episode of care  ([place]) 08/2013   80% branch of D1, 40% mid AV groove circumflex, 100% RCA with subacute thrombus -- thrombus extending into RPA V with 100% occlusion after initial angioplasty of mid RCA ;; Post MI ECHO 6/9/'15: EF 50-55%, mild LVH with moderate HK of inferior wall, Gr1 DD, mild LA dilation; mildly reduced RV function    Past Surgical History:  Procedure Laterality Date   ABDOMINAL AORTOGRAM W/LOWER EXTREMITY N/A 05/26/2019   Procedure: ABDOMINAL AORTOGRAM W/LOWER EXTREMITY;  Surgeon: Marty Heck, MD;  Location: [place] [facility];  Service: Cardiovascular;  Laterality: N/A;   ABDOMINAL AORTOGRAM W/LOWER EXTREMITY N/A 11/15/2020   Procedure: ABDOMINAL AORTOGRAM W/LOWER EXTREMITY;  Surgeon: Marty Heck, MD;  Location: [place] [facility];  Service: Cardiovascular;  Laterality: N/A;   ABDOMINAL AORTOGRAM W/LOWER EXTREMITY N/A 04/18/2021   Procedure: ABDOMINAL AORTOGRAM W/LOWER EXTREMITY;  Surgeon: Marty Heck, MD;  Location: [place] [facility];  Service: Cardiovascular;  Laterality: N/A;   AMPUTATION Left 05/25/2019   Procedure: AMPUTATION RAY 5th;  Surgeon: Trula Slade, DPM;  Location: [place];  Service: Podiatry;  Laterality: Left;   BONE BIOPSY Left 04/24/2021   Procedure: BONE BIOPSY;  Surgeon: Landis Martins, DPM;  Location: [place];  Service: Podiatry;  Laterality: Left;  left ankle/fibula   Cardiac Event Monitor  July-August 2015   Sinus rhythm with PVCs   COLONOSCOPY N/A 08/31/2013  Procedure: COLONOSCOPY;  Surgeon: Juanita Craver, MD;  Location: [facility] ENDOSCOPY;  Service: Endoscopy;  Laterality: N/A;   CORONARY STENT INTERVENTION N/A 11/19/2020   Procedure: CORONARY STENT INTERVENTION;  Surgeon: Nelva Bush, MD;  Location: [place] [facility];  Service: Cardiovascular;  Laterality: N/A;   ESOPHAGOGASTRODUODENOSCOPY N/A 09/01/2013   Procedure: ESOPHAGOGASTRODUODENOSCOPY (EGD);  Surgeon: Beryle Beams, MD;  Location: [facility] END[facility]COPY;  Service: Endoscopy;  Laterality:  N/A;  bedside   INCISION AND DRAINAGE Left 04/24/2021   Procedure: INCISION AND DRAINAGE;  Surgeon: Landis Martins, DPM;  Location: [place];  Service: Podiatry;  Laterality: Left;   LEFT HEART CATH AND CORONARY ANGIOGRAPHY N/A 11/19/2020   Procedure: LEFT HEART CATH AND CORONARY ANGIOGRAPHY;  Surgeon: Nelva Bush, MD;  Location: [place] [facility];  Service: Cardiovascular;  Laterality: N/A;   LEFT HEART CATHETERIZATION WITH CORONARY ANGIOGRAM N/A 08/30/2013   Procedure: LEFT HEART CATHETERIZATION WITH CORONARY ANGIOGRAM;  Surgeon: Leonie Man, MD;  Location: [facility] CATH LAB: 100% mRCA (thrombus - extends to RPAV), 80% D1, 40% AVG Cx.   PERCUTANEOUS CORONARY STENT INTERVENTION (PCI-S)  08/30/2013   Procedure: PERCUTANEOUS CORONARY STENT INTERVENTION (PCI-S);  Surgeon: Leonie Man, MD;  Location: [facility] CATH LAB;  Integrity Resolute DES 2.0 mm x 38 mm -- 3.35 mm.; PTCA of proximal RPA V. - 3.0 mm x 15 mm balloon   PERIPHERAL VASCULAR ATHERECTOMY  05/26/2019   Procedure: PERIPHERAL VASCULAR ATHERECTOMY;  Surgeon: Marty Heck, MD;  Location: [place] [facility];  Service: Cardiovascular;;  Left SFA   PERIPHERAL VASCULAR INTERVENTION  05/26/2019   Procedure: PERIPHERAL VASCULAR INTERVENTION;  Surgeon: Marty Heck, MD;  Location: [place] [facility];  Service: Cardiovascular;;  Left SFA   PERIPHERAL VASCULAR INTERVENTION Right 11/15/2020   Procedure: PERIPHERAL VASCULAR INTERVENTION;  Surgeon: Marty Heck, MD;  Location: [place] [facility];  Service: Cardiovascular;  Laterality: Right;  Superficial Femoral Artery   PITUITARY SURGERY     TEE WITHOUT CARDIOVERSION N/A 04/26/2021   Procedure: TRANSESOPHAGEAL ECHOCARDIOGRAM (TEE);  Surgeon: Buford Dresser, MD;  Location: [facility] ENDOSCOPY;  Service: Cardiovascular;  Laterality: N/A;   TRANSTHORACIC ECHOCARDIOGRAM  08/30/2013   mild LVH. EF 50-55%. Moderate HK of the entire inferior myocardium. GR 1 DD. Mild LA dilation. Mildly  reduced RV function   TYMPANOMASTOIDECTOMY Right 12/28/2017   Procedure: RIGHT TYMPANOMASTOIDECTOMY;  Surgeon: Leta Baptist, MD;  Location: [place];  Service: ENT;  Laterality: Right;    Family History  Problem Relation Age of Onset   Cancer Mother 62       multiple myeloma   Heart attack Father 62   Cancer Sister    Alzheimer's disease Maternal Grandmother     Social History:  reports that she has quit smoking. She has never used smokeless tobacco. She reports that she does not drink alcohol and does not use drugs.  Allergies:  Allergies  Allergen Reactions   Strawberry Extract Itching, Swelling and Anaphylaxis    Mouth swells and gets itchy    Medications: Scheduled:  atorvastatin  40 mg Oral QPM   insulin aspart  0-5 Units Subcutaneous QHS   insulin aspart  0-9 Units Subcutaneous TID WC   levothyroxine  125 mcg Oral QAC breakfast   [START ON 05/28/2021] pantoprazole  40 mg Intravenous Q12H   Continuous:  sodium chloride     lactated ringers 50 mL/hr at 05/25/21 0427   pantoprazole 8 mg/hr (05/25/21 0814)    Results for orders placed  or performed during the hospital encounter of 05/24/21 (from the past 24 hour(s))  Resp Panel by RT-PCR (Flu A&B, Covid) Nasopharyngeal Swab     Status: None   Collection Time: 05/24/21  5:12 PM   Specimen: Nasopharyngeal Swab; Nasopharyngeal(NP) swabs in vial transport medium  Result Value Ref Range   SARS Coronavirus 2 by RT PCR NEGATIVE NEGATIVE   Influenza A by PCR NEGATIVE NEGATIVE   Influenza B by PCR NEGATIVE NEGATIVE  Comprehensive metabolic panel     Status: Abnormal   Collection Time: 05/24/21  7:39 PM  Result Value Ref Range   Sodium 132 (L) 135 - 145 mmol/L   Potassium 4.2 3.5 - 5.1 mmol/L   Chloride 100 98 - 111 mmol/L   CO2 20 (L) 22 - 32 mmol/L   Glucose, Bld 167 (H) 70 - 99 mg/dL   BUN 28 (H) 8 - 23 mg/dL   Creatinine, Ser 1.89 (H) 0.44 - 1.00 mg/dL   Calcium 8.5 (L) 8.9 - 10.3 mg/dL   Total Protein 6.7  6.5 - 8.1 g/dL   Albumin 3.1 (L) 3.5 - 5.0 g/dL   AST 15 15 - 41 U/L   ALT 13 0 - 44 U/L   Alkaline Phosphatase 80 38 - 126 U/L   Total Bilirubin 0.5 0.3 - 1.2 mg/dL   GFR, Estimated 29 (L) >60 mL/min   Anion gap 12 5 - 15  CBC with Differential     Status: Abnormal   Collection Time: 05/24/21  7:39 PM  Result Value Ref Range   WBC 11.4 (H) 4.0 - 10.5 K/uL   RBC 2.12 (L) 3.87 - 5.11 MIL/uL   Hemoglobin 5.6 (LL) 12.0 - 15.0 g/dL   HCT 19.0 (L) 36.0 - 46.0 %   MCV 89.6 80.0 - 100.0 fL   MCH 26.4 26.0 - 34.0 pg   MCHC 29.5 (L) 30.0 - 36.0 g/dL   RDW 19.0 (H) 11.5 - 15.5 %   Platelets 300 150 - 400 K/uL   nRBC 0.0 0.0 - 0.2 %   Neutrophils Relative % 70 %   Neutro Abs 8.0 (H) 1.7 - 7.7 K/uL   Lymphocytes Relative 10 %   Lymphs Abs 1.2 0.7 - 4.0 K/uL   Monocytes Relative 17 %   Monocytes Absolute 2.0 (H) 0.1 - 1.0 K/uL   Eosinophils Relative 1 %   Eosinophils Absolute 0.1 0.0 - 0.5 K/uL   Basophils Relative 1 %   Basophils Absolute 0.1 0.0 - 0.1 K/uL   Immature Granulocytes 1 %   Abs Immature Granulocytes 0.11 (H) 0.00 - 0.07 K/uL  Troponin I (High Sensitivity)     Status: None   Collection Time: 05/24/21  7:39 PM  Result Value Ref Range   Troponin I (High Sensitivity) 10 <18 ng/L  Troponin I (High Sensitivity)     Status: None   Collection Time: 05/24/21  8:19 PM  Result Value Ref Range   Troponin I (High Sensitivity) 8 <18 ng/L  Type and screen [facility]     Status: None (Preliminary result)   Collection Time: 05/24/21  8:30 PM  Result Value Ref Range   ABO/RH(D) O POS    Antibody Screen NEG    Sample Expiration 05/27/2021,2359    Unit Number [id number]    Blood Component Type RED CELLS,LR    Unit division 00    Status of Unit ISSUED    Transfusion Status OK TO TRANSFUSE    Crossmatch Result  Compatible Performed at [facility], [place] [address], [place], [place] [address]    Unit Number [id number]    Blood Component Type RED  CELLS,LR    Unit division 00    Status of Unit ISSUED    Transfusion Status OK TO TRANSFUSE    Crossmatch Result Compatible   Prepare RBC (crossmatch)     Status: None   Collection Time: 05/24/21  8:30 PM  Result Value Ref Range   Order Confirmation      ORDER PROCESSED BY BLOOD BANK Performed at [facility], [place] [address] [place], [place] [address]   POC CBG, ED     Status: Abnormal   Collection Time: 05/24/21 11:55 PM  Result Value Ref Range   Glucose-Capillary 139 (H) 70 - 99 mg/dL  Urinalysis, Routine w reflex microscopic     Status: Abnormal   Collection Time: 05/25/21 12:00 AM  Result Value Ref Range   Color, Urine YELLOW YELLOW   APPearance HAZY (A) CLEAR   Specific Gravity, Urine 1.011 1.005 - 1.030   pH 5.0 5.0 - 8.0   Glucose, UA >=500 (A) NEGATIVE mg/dL   Hgb urine dipstick NEGATIVE NEGATIVE   Bilirubin Urine NEGATIVE NEGATIVE   Ketones, ur NEGATIVE NEGATIVE mg/dL   Protein, ur NEGATIVE NEGATIVE mg/dL   Nitrite NEGATIVE NEGATIVE   Leukocytes,Ua TRACE (A) NEGATIVE   RBC / HPF 0-5 0 - 5 RBC/hpf   WBC, UA 0-5 0 - 5 WBC/hpf   Bacteria, UA RARE (A) NONE SEEN   Squamous Epithelial / LPF 0-5 0 - 5   Mucus PRESENT    Hyaline Casts, UA PRESENT      DG Chest 1 View  Result Date: 05/24/2021 CLINICAL DATA:  Nausea and shortness of breath for the past 2 days. EXAM: CHEST  1 VIEW COMPARISON:  Chest x-ray dated November 21, 2020. FINDINGS: Stable cardiomediastinal silhouette. Chronic mild lingular and left lower lobe atelectasis/scarring. No focal consolidation, pleural effusion, or pneumothorax. No acute osseous abnormality. IMPRESSION: No active disease. Electronically Signed   By: Titus Dubin M.D.   On: 05/24/2021 18:25    ROS:  As stated above in the HPI otherwise negative.  Blood pressure (!) 103/44, pulse 65, temperature 98.5 F (36.9 C), temperature source Oral, resp. rate 14, height _0  (1.702 m), weight 99.8 kg, SpO2 100 %.    PE: Gen: NAD, Alert and  Oriented HEENT:  [place]/AT, EOMI Neck: Supple, no LAD Lungs: CTA Bilaterally CV: RRR without M/G/R ABD: Soft, NTND, +BS Ext: No C/C/E  Assessment/Plan: 1) Anemia. 2) AKI. 3) Left ankle MRSA. 4) Weakness.   The patient also had a colonoscopy on 03/10/2019 with findings of a small ascending colon adenoma.  With her anticoagulation, an EGD will be performed to look for any acute bleeding sites.  She has a history of PUD, but the 2020 evaluation was negative.  Plan: 1) EGD tomorrow and further recommendations pending the findings. 2) Follow HGB. 3) Agree with transfusions.  Bonnita Newby D 05/25/2021, 7:44 AM

## 2021-05-25 NOTE — ED Notes (Addendum)
Patient is getting a unite of blood now ?

## 2021-05-25 NOTE — ED Notes (Signed)
ED TO INPATIENT HANDOFF REPORT  ED Nurse Name and Phone #: [phone]   S Name/Age/Gender Tammy Good Human 71 y.o. female Room/Bed: 010C/010C  Code Status   Code Status: Full Code  Home/SNF/Other Home Patient oriented to: self, place, time, and situation Is this baseline? Yes   Triage Complete: Triage complete  Chief Complaint Generalized weakness [R53.1]  Triage Note Pt transported by [facility] from home for c/o nausea, shob x 2 days, worse with movement. Pt reports no pain only "fluttering" in chest. Pt on RA tor transport to ED.     Allergies Allergies  Allergen Reactions   Strawberry Extract Itching, Swelling and Anaphylaxis    Mouth swells and gets itchy    Level of Care/Admitting Diagnosis ED Disposition     ED Disposition  Admit   Condition  --   Comment  Hospital Area: [place] [[id number]]  Level of Care: Telemetry Medical [104]  May admit patient to [facility] or [facility] if equivalent level of care is available:: No  Covid Evaluation: Confirmed COVID Negative  Diagnosis: Generalized weakness [124580]  Admitting Physician: Kayleen Memos [[id number]]  Attending Physician: Kayleen Memos [[id number]]  Estimated length of stay: past midnight tomorrow  Certification:: I certify this patient will need inpatient services for at least 2 midnights          B Medical/Surgery History Past Medical History:  Diagnosis Date   CAD S/P percutaneous coronary angioplasty 08/2013   100% mRCA - PCI Integrity Resolute DES 3.0 mm x 38 mm - 3.35 mm; PTCA of RPA V 2.0 mm x 15 mm   Cholesteatoma    right   Diabetes mellitus type 2 in obese (HCC)    On insulin and Invokana   History of osteomyelitis L 5th Toe all 05/2019   s/p Partial Ray Amputation with partial closure; 6 wks Abx & LSFA Atherectomy/DEB PTA with Stent for focal dissection.   Hyperlipidemia with target LDL less than 70    Hypothyroidism (acquired)    Mild essential hypertension    Obesity  (BMI 30-39.9) 11/17/2013   PAD (peripheral artery disease) ([place]) 05/26/2019   05/26/19: Abd AoGram- BLE runoff -> L SFA orbital atherectomy - PTA w/ DEB & Stent 6 x 40 Luttonix (for focal dissection) - patent Pop A with 3 V runoff. LEA Dopplers 01/03/2020: RABI (prev) 0.68 (0.53)/ RTBI (prev) 0.58 (0.33); LABI (prev) 0.80 (0.64), LTBI (prev) 0.64 (0.51); R mSFA ~50-74%, L mSFA 50-74%. Patent Prox SFA stent < 49% stenosis   Panhypopituitarism (HCC)    ST elevation myocardial infarction (STEMI) of inferior wall, subsequent episode of care ([place]) 08/2013   80% branch of D1, 40% mid AV groove circumflex, 100% RCA with subacute thrombus -- thrombus extending into RPA V with 100% occlusion after initial angioplasty of mid RCA ;; Post MI ECHO 6/9/'15: EF 50-55%, mild LVH with moderate HK of inferior wall, Gr1 DD, mild LA dilation; mildly reduced RV function   Past Surgical History:  Procedure Laterality Date   ABDOMINAL AORTOGRAM W/LOWER EXTREMITY N/A 05/26/2019   Procedure: ABDOMINAL AORTOGRAM W/LOWER EXTREMITY;  Surgeon: Marty Heck, MD;  Location: [place] [facility];  Service: Cardiovascular;  Laterality: N/A;   ABDOMINAL AORTOGRAM W/LOWER EXTREMITY N/A 11/15/2020   Procedure: ABDOMINAL AORTOGRAM W/LOWER EXTREMITY;  Surgeon: Marty Heck, MD;  Location: [place] [facility];  Service: Cardiovascular;  Laterality: N/A;   ABDOMINAL AORTOGRAM W/LOWER EXTREMITY N/A 04/18/2021   Procedure: ABDOMINAL AORTOGRAM W/LOWER EXTREMITY;  Surgeon: Marty Heck, MD;  Location: [place] [facility];  Service: Cardiovascular;  Laterality: N/A;   AMPUTATION Left 05/25/2019   Procedure: AMPUTATION RAY 5th;  Surgeon: Trula Slade, DPM;  Location: [place];  Service: Podiatry;  Laterality: Left;   BONE BIOPSY Left 04/24/2021   Procedure: BONE BIOPSY;  Surgeon: Landis Martins, DPM;  Location: [place];  Service: Podiatry;  Laterality: Left;  left ankle/fibula   Cardiac Event Monitor  July-August 2015   Sinus  rhythm with PVCs   COLONOSCOPY N/A 08/31/2013   Procedure: COLONOSCOPY;  Surgeon: Juanita Craver, MD;  Location: [facility] ENDOSCOPY;  Service: Endoscopy;  Laterality: N/A;   CORONARY STENT INTERVENTION N/A 11/19/2020   Procedure: CORONARY STENT INTERVENTION;  Surgeon: Nelva Bush, MD;  Location: [place] [facility];  Service: Cardiovascular;  Laterality: N/A;   ESOPHAGOGASTRODUODENOSCOPY N/A 09/01/2013   Procedure: ESOPHAGOGASTRODUODENOSCOPY (EGD);  Surgeon: Beryle Beams, MD;  Location: [facility] ENDOSCOPY;  Service: Endoscopy;  Laterality: N/A;  bedside   INCISION AND DRAINAGE Left 04/24/2021   Procedure: INCISION AND DRAINAGE;  Surgeon: Landis Martins, DPM;  Location: [place];  Service: Podiatry;  Laterality: Left;   LEFT HEART CATH AND CORONARY ANGIOGRAPHY N/A 11/19/2020   Procedure: LEFT HEART CATH AND CORONARY ANGIOGRAPHY;  Surgeon: Nelva Bush, MD;  Location: [place] [facility];  Service: Cardiovascular;  Laterality: N/A;   LEFT HEART CATHETERIZATION WITH CORONARY ANGIOGRAM N/A 08/30/2013   Procedure: LEFT HEART CATHETERIZATION WITH CORONARY ANGIOGRAM;  Surgeon: Leonie Man, MD;  Location: [facility] CATH LAB: 100% mRCA (thrombus - extends to RPAV), 80% D1, 40% AVG Cx.   PERCUTANEOUS CORONARY STENT INTERVENTION (PCI-S)  08/30/2013   Procedure: PERCUTANEOUS CORONARY STENT INTERVENTION (PCI-S);  Surgeon: Leonie Man, MD;  Location: [facility] CATH LAB;  Integrity Resolute DES 2.0 mm x 38 mm -- 3.35 mm.; PTCA of proximal RPA V. - 3.0 mm x 15 mm balloon   PERIPHERAL VASCULAR ATHERECTOMY  05/26/2019   Procedure: PERIPHERAL VASCULAR ATHERECTOMY;  Surgeon: Marty Heck, MD;  Location: [place] [facility];  Service: Cardiovascular;;  Left SFA   PERIPHERAL VASCULAR INTERVENTION  05/26/2019   Procedure: PERIPHERAL VASCULAR INTERVENTION;  Surgeon: Marty Heck, MD;  Location: [place] [facility];  Service: Cardiovascular;;  Left SFA   PERIPHERAL VASCULAR INTERVENTION Right 11/15/2020   Procedure:  PERIPHERAL VASCULAR INTERVENTION;  Surgeon: Marty Heck, MD;  Location: [place] [facility];  Service: Cardiovascular;  Laterality: Right;  Superficial Femoral Artery   PITUITARY SURGERY     TEE WITHOUT CARDIOVERSION N/A 04/26/2021   Procedure: TRANSESOPHAGEAL ECHOCARDIOGRAM (TEE);  Surgeon: Buford Dresser, MD;  Location: [facility] ENDOSCOPY;  Service: Cardiovascular;  Laterality: N/A;   TRANSTHORACIC ECHOCARDIOGRAM  08/30/2013   mild LVH. EF 50-55%. Moderate HK of the entire inferior myocardium. GR 1 DD. Mild LA dilation. Mildly reduced RV function   TYMPANOMASTOIDECTOMY Right 12/28/2017   Procedure: RIGHT TYMPANOMASTOIDECTOMY;  Surgeon: Leta Baptist, MD;  Location: [place];  Service: ENT;  Laterality: Right;     A IV Location/Drains/Wounds Patient Lines/Drains/Airways Status     Active Line/Drains/Airways     Name Placement date Placement time Site Days   Peripheral IV 05/24/21 20 G 2.5" Anterior;Right;Upper Arm 05/24/21  2039  Arm  1   Peripheral IV 05/24/21 22 G 2.5" Anterior;Right Forearm 05/24/21  2257  Forearm  1   External Urinary Catheter 04/23/21  1231  --  32   Incision (Closed) 12/28/17 Ear Right 12/28/17  1125  --  1244   Incision (Closed) 05/25/19 Foot 05/25/19  1303  -- 731   Incision (Closed) 04/24/21 Leg Left 04/24/21  2259  -- 31   Wound / Incision (Open or Dehisced) 11/15/20 Leg Right;Lower stage 2 open and redden draining purlent drainage 11/15/20  1930  Leg  191   Wound / Incision (Open or Dehisced) 04/23/21 Other (Comment) Ankle Left full thickness wound 04/23/21  --  Ankle  32   Wound / Incision (Open or Dehisced) 04/23/21 Other (Comment) Foot Left full thickness wound 04/23/21  --  Foot  32   Wound / Incision (Open or Dehisced) 04/26/21 Skin tear Thigh Left;Posterior Pinched while on [facility] 04/26/21  2306  Thigh  29            Intake/Output Last 24 hours  Intake/Output Summary (Last 24 hours) at 05/25/2021 1240 Last data filed at 05/25/2021  0756 Gross per 24 hour  Intake 1663 ml  Output 200 ml  Net 1463 ml    Labs/Imaging Results for orders placed or performed during the hospital encounter of 05/24/21 (from the past 48 hour(s))  Resp Panel by RT-PCR (Flu A&B, Covid) Nasopharyngeal Swab     Status: None   Collection Time: 05/24/21  5:12 PM   Specimen: Nasopharyngeal Swab; Nasopharyngeal(NP) swabs in vial transport medium  Result Value Ref Range   SARS Coronavirus 2 by RT PCR NEGATIVE NEGATIVE    Comment: (NOTE) SARS-CoV-2 target nucleic acids are NOT DETECTED.  The SARS-CoV-2 RNA is generally detectable in upper respiratory specimens during the acute phase of infection. The lowest concentration of SARS-CoV-2 viral copies this assay can detect is 138 copies/mL. A negative result does not preclude SARS-Cov-2 infection and should not be used as the sole basis for treatment or other patient management decisions. A negative result may occur with  improper specimen collection/handling, submission of specimen other than nasopharyngeal swab, presence of viral mutation(s) within the areas targeted by this assay, and inadequate number of viral copies(<138 copies/mL). A negative result must be combined with clinical observations, patient history, and epidemiological information. The expected result is Negative.  Fact Sheet for Patients:  EntrepreneurPulse.com.au  Fact Sheet for Healthcare Providers:  IncredibleEmployment.be  This test is no t yet approved or cleared by the [place] FDA and  has been authorized for detection and/or diagnosis of SARS-CoV-2 by FDA under an Emergency Use Authorization (EUA). This EUA will remain  in effect (meaning this test can be used) for the duration of the COVID-19 declaration under Section 564(b)(1) of the Act, 21 U.S.C.section 360bbb-3(b)(1), unless the authorization is terminated  or revoked sooner.       Influenza A by PCR NEGATIVE NEGATIVE    Influenza B by PCR NEGATIVE NEGATIVE    Comment: (NOTE) The Xpert Xpress SARS-CoV-2/FLU/RSV plus assay is intended as an aid in the diagnosis of influenza from Nasopharyngeal swab specimens and should not be used as a sole basis for treatment. Nasal washings and aspirates are unacceptable for Xpert Xpress SARS-CoV-2/FLU/RSV testing.  Fact Sheet for Patients: EntrepreneurPulse.com.au  Fact Sheet for Healthcare Providers: IncredibleEmployment.be  This test is not yet approved or cleared by the [place] FDA and has been authorized for detection and/or diagnosis of SARS-CoV-2 by FDA under an Emergency Use Authorization (EUA). This EUA will remain in effect (meaning this test can be used) for the duration of the COVID-19 declaration under Section 564(b)(1) of the Act, 21 U.S.C. section 360bbb-3(b)(1), unless the authorization is terminated or revoked.  Performed at  [facility], [place] [address], [place], [place] [address]   Comprehensive metabolic panel     Status: Abnormal   Collection Time: 05/24/21  7:39 PM  Result Value Ref Range   Sodium 132 (L) 135 - 145 mmol/L   Potassium 4.2 3.5 - 5.1 mmol/L   Chloride 100 98 - 111 mmol/L   CO2 20 (L) 22 - 32 mmol/L   Glucose, Bld 167 (H) 70 - 99 mg/dL    Comment: Glucose reference range applies only to samples taken after fasting for at least 8 hours.   BUN 28 (H) 8 - 23 mg/dL   Creatinine, Ser 1.89 (H) 0.44 - 1.00 mg/dL   Calcium 8.5 (L) 8.9 - 10.3 mg/dL   Total Protein 6.7 6.5 - 8.1 g/dL   Albumin 3.1 (L) 3.5 - 5.0 g/dL   AST 15 15 - 41 U/L   ALT 13 0 - 44 U/L   Alkaline Phosphatase 80 38 - 126 U/L   Total Bilirubin 0.5 0.3 - 1.2 mg/dL   GFR, Estimated 29 (L) >60 mL/min    Comment: (NOTE) Calculated using the CKD-EPI Creatinine Equation (2021)    Anion gap 12 5 - 15    Comment: Performed at [facility], [place] [address], [place], [place] [address]  CBC with Differential      Status: Abnormal   Collection Time: 05/24/21  7:39 PM  Result Value Ref Range   WBC 11.4 (H) 4.0 - 10.5 K/uL   RBC 2.12 (L) 3.87 - 5.11 MIL/uL   Hemoglobin 5.6 (LL) 12.0 - 15.0 g/dL    Comment: REPEATED TO VERIFY THIS CRITICAL RESULT HAS VERIFIED AND BEEN CALLED TO RN,CHRIS CHRISCO BY [facility] ON 03 03 2023 AT 2011, AND HAS BEEN READ BACK.     HCT 19.0 (L) 36.0 - 46.0 %   MCV 89.6 80.0 - 100.0 fL   MCH 26.4 26.0 - 34.0 pg   MCHC 29.5 (L) 30.0 - 36.0 g/dL   RDW 19.0 (H) 11.5 - 15.5 %   Platelets 300 150 - 400 K/uL   nRBC 0.0 0.0 - 0.2 %   Neutrophils Relative % 70 %   Neutro Abs 8.0 (H) 1.7 - 7.7 K/uL   Lymphocytes Relative 10 %   Lymphs Abs 1.2 0.7 - 4.0 K/uL   Monocytes Relative 17 %   Monocytes Absolute 2.0 (H) 0.1 - 1.0 K/uL   Eosinophils Relative 1 %   Eosinophils Absolute 0.1 0.0 - 0.5 K/uL   Basophils Relative 1 %   Basophils Absolute 0.1 0.0 - 0.1 K/uL   Immature Granulocytes 1 %   Abs Immature Granulocytes 0.11 (H) 0.00 - 0.07 K/uL    Comment: Performed at [place] [address], [place], [place] [address]  Troponin I (High Sensitivity)     Status: None   Collection Time: 05/24/21  7:39 PM  Result Value Ref Range   Troponin I (High Sensitivity) 10 <18 ng/L    Comment: (NOTE) Elevated high sensitivity troponin I (hsTnI) values and significant  changes across serial measurements may suggest ACS but many other  chronic and acute conditions are known to elevate hsTnI results.  Refer to the "Links" section for chest pain algorithms and additional  guidance. Performed at [facility], [place] [address], [place],  [address]   Troponin I (High Sensitivity)     Status: None   Collection Time: 05/24/21  8:19 PM  Result Value Ref  Range   Troponin I (High Sensitivity) 8 <18 ng/L    Comment: (NOTE) Elevated high sensitivity troponin I (hsTnI) values and significant  changes across serial measurements may suggest ACS but many other  chronic and  acute conditions are known to elevate hsTnI results.  Refer to the "Links" section for chest pain algorithms and additional  guidance. Performed at [facility], [place] [address] [place], [place] [address]   Type and screen [place]     Status: None (Preliminary result)   Collection Time: 05/24/21  8:30 PM  Result Value Ref Range   ABO/RH(D) O POS    Antibody Screen NEG    Sample Expiration 05/27/2021,2359    Unit Number [id number]    Blood Component Type RED CELLS,LR    Unit division 00    Status of Unit ISSUED    Transfusion Status OK TO TRANSFUSE    Crossmatch Result Compatible    Unit Number [id number]    Blood Component Type RED CELLS,LR    Unit division 00    Status of Unit ISSUED,FINAL    Transfusion Status OK TO TRANSFUSE    Crossmatch Result      Compatible Performed at [facility], [place] [address], [place], [place] [address]   Prepare RBC (crossmatch)     Status: None   Collection Time: 05/24/21  8:30 PM  Result Value Ref Range   Order Confirmation      ORDER PROCESSED BY BLOOD BANK Performed at [facility], [place] [address], [place], [place] [address]   POC CBG, ED     Status: Abnormal   Collection Time: 05/24/21 11:55 PM  Result Value Ref Range   Glucose-Capillary 139 (H) 70 - 99 mg/dL    Comment: Glucose reference range applies only to samples taken after fasting for at least 8 hours.  Urinalysis, Routine w reflex microscopic     Status: Abnormal   Collection Time: 05/25/21 12:00 AM  Result Value Ref Range   Color, Urine YELLOW YELLOW   APPearance HAZY (A) CLEAR   Specific Gravity, Urine 1.011 1.005 - 1.030   pH 5.0 5.0 - 8.0   Glucose, UA >=500 (A) NEGATIVE mg/dL   Hgb urine dipstick NEGATIVE NEGATIVE   Bilirubin Urine NEGATIVE NEGATIVE   Ketones, ur NEGATIVE NEGATIVE mg/dL   Protein, ur NEGATIVE NEGATIVE mg/dL   Nitrite NEGATIVE NEGATIVE   Leukocytes,Ua TRACE (A) NEGATIVE   RBC / HPF 0-5 0 - 5 RBC/hpf    WBC, UA 0-5 0 - 5 WBC/hpf   Bacteria, UA RARE (A) NONE SEEN   Squamous Epithelial / LPF 0-5 0 - 5   Mucus PRESENT    Hyaline Casts, UA PRESENT     Comment: Performed at [facility], [address], [place], [place] [address]  CBG monitoring, ED     Status: None   Collection Time: 05/25/21  7:57 AM  Result Value Ref Range   Glucose-Capillary 95 70 - 99 mg/dL    Comment: Glucose reference range applies only to samples taken after fasting for at least 8 hours.  CBG monitoring, ED     Status: None   Collection Time: 05/25/21 12:30 PM  Result Value Ref Range   Glucose-Capillary 77 70 - 99 mg/dL    Comment: Glucose reference range applies only to samples taken after fasting for at least 8 hours.   DG Chest 1 View  Result Date: 05/24/2021 CLINICAL DATA:  Nausea and shortness of breath for the past 2 days. EXAM: CHEST  1 VIEW COMPARISON:  Chest x-ray dated November 21, 2020. FINDINGS: Stable cardiomediastinal silhouette. Chronic mild lingular and left lower lobe atelectasis/scarring. No focal consolidation, pleural effusion, or pneumothorax. No acute osseous abnormality. IMPRESSION: No active disease. Electronically Signed   By: Titus Dubin M.D.   On: 05/24/2021 18:25   NM PULMONARY VENT AND PERF (V/Q Scan)  Result Date: 05/25/2021 CLINICAL DATA:  Shortness of breath and lightheadedness. EXAM: NUCLEAR MEDICINE PERFUSION LUNG SCAN TECHNIQUE: Perfusion images were obtained in multiple projections after intravenous injection of radiopharmaceutical. Ventilation scans intentionally deferred if perfusion scan and chest x-ray adequate for interpretation during COVID 19 epidemic. RADIOPHARMACEUTICALS:  4 mCi Tc-38mMAA IV COMPARISON:  Chest radiograph dated May 24, 2021 FINDINGS: Large cardiac shadow consistent with cardiomegaly. No segmental perfusion defect. IMPRESSION: Normal or very low probability of pulmonary embolism. Cardiomegaly. Electronically Signed   By: IKeane PoliceD.O.   On: 05/25/2021  11:09    Pending Labs Unresulted Labs (From admission, onward)     Start     Ordered   05/25/21 0500  Iron and TIBC  Tomorrow morning,   R        05/24/21 2215   05/25/21 0500  Ferritin  Tomorrow morning,   R        05/24/21 2215   05/25/21 0500  Hemoglobin A1c  Tomorrow morning,   R       Comments: To assess prior glycemic control    05/24/21 2216   05/25/21 0500  CBC  Tomorrow morning,   R        05/24/21 2311   05/25/21 0500  Comprehensive metabolic panel  Tomorrow morning,   R        05/24/21 2311   05/25/21 0500  Magnesium  Tomorrow morning,   R        05/24/21 2311   05/25/21 0500  Phosphorus  Tomorrow morning,   R        05/24/21 2311   05/25/21 0500  Protime-INR  Tomorrow morning,   R        05/24/21 2311   05/24/21 2216  Occult blood card to lab, stool  ONCE - STAT,   STAT        05/24/21 2215            Vitals/Pain Today's Vitals   05/25/21 1053 05/25/21 1140 05/25/21 1145 05/25/21 1240  BP:   (!) 116/49   Pulse: 64  61   Resp: 16  15   Temp:      TempSrc:      SpO2: 100%  100%   Weight:      Height:      PainSc:  Asleep  0-No pain    Isolation Precautions No active isolations  Medications Medications  0.9 %  sodium chloride infusion (10 mL/hr Intravenous Not Given 05/24/21 2337)  insulin aspart (novoLOG) injection 0-9 Units (0 Units Subcutaneous Not Given 05/25/21 1240)  insulin aspart (novoLOG) injection 0-5 Units (0 Units Subcutaneous Not Given 05/25/21 0053)  pantoprozole (PROTONIX) 80 mg /NS 100 mL infusion (8 mg/hr Intravenous New Bag/Given 05/25/21 0814)  pantoprazole (PROTONIX) injection 40 mg (has no administration in time range)  lactated ringers infusion ( Intravenous New Bag/Given 05/25/21 0427)  ondansetron (ZOFRAN) injection 4 mg (has no administration in time range)  acetaminophen (TYLENOL) tablet 650 mg (has no administration in time range)  oxyCODONE (Oxy IR/ROXICODONE) immediate  release tablet 5 mg (has no administration in time range)   melatonin tablet 3 mg (has no administration in time range)  polyethylene glycol (MIRALAX / GLYCOLAX) packet 17 g (has no administration in time range)  atorvastatin (LIPITOR) tablet 40 mg (40 mg Oral Not Given 05/25/21 0303)  levothyroxine (SYNTHROID) tablet 125 mcg (125 mcg Oral Given 05/25/21 0809)  lactated ringers bolus 1,000 mL (0 mLs Intravenous Stopped 05/24/21 2210)  pantoprazole (PROTONIX) 80 mg /NS 100 mL IVPB (0 mg Intravenous Stopped 05/24/21 2339)  technetium albumin aggregated (MAA) injection solution 4 millicurie (4 millicuries Intravenous Contrast Given 05/25/21 0940)    Mobility walks     Focused Assessments    R Recommendations: See Admitting Provider Note  Report given to:   Additional Notes:

## 2021-05-25 NOTE — ED Notes (Signed)
The pt accidentally wet her bed  linen changed pt cleaned ?

## 2021-05-25 NOTE — ED Notes (Signed)
Pt returned from [facility], placed back on bedside monitor. VSS. Denies any further needs at this time. Call bell within reach. ?

## 2021-05-25 NOTE — Progress Notes (Signed)
PROGRESS NOTE    Tammy Good  VQQ:[id number] DOB: June 07, 1950 DOA: 05/24/2021 PCP: [name], MD    Brief Narrative:  71 year old female with history of left ankle infection, MRSA bacteremia and tenosynovitis of the left ankle status post I&D, essential hypertension, hyperlipidemia, type 2 diabetes, chronic iron deficiency anemia with baseline hemoglobin about 8, hypothyroidism, paroxysmal A-fib on Eliquis, GERD, coronary artery disease status post PCI with stent presented from home with gradually worsening generalized weakness for 2 days, poor appetite and ongoing chronic nausea.  At the emergency room blood pressure stable.  Hemoglobin 5.6, creatinine 1.89 from baseline 0.78.   Assessment & Plan:  Symptomatic anemia, acute blood loss anemia with history of chronic iron deficiency anemia: Currently hemodynamically stabilizing. Presentation hemoglobin 5.6-2 units of PRBC transfused-posttransfusion hemoglobin pending.  Continue maintenance IV fluids. Suspect upper GI bleeding with history of duodenal ulcers and gastric erosions, Protonix bolus and infusion.  Close monitoring of hemoglobin.  Seen by gastroenterology and scheduled for upper GI endoscopy 3/5.  Will allow clears and keep n.p.o. past midnight.  Acute kidney injury likely prerenal injury in the setting of dehydration and poor oral intake: #1.  IV fluids and monitoring.  Recheck in the morning.  Type 2 diabetes with hyperglycemia: A1c pending.  Sliding scale insulin.  Essential hypertension: Hold home hypertensives.  Risk of hypotension.  Hypothyroidism: Resume home Synthroid.  Paroxysmal A-fib: Rate controlled in sinus rhythm.  Eliquis on hold.  Left ankle infection: Completed antibiotic therapy.  Local wound care.   DVT prophylaxis: SCDs Start: 05/24/21 2214   Code Status: Full code Family Communication: None Disposition Plan: Status is: Inpatient Remains inpatient appropriate because: Severe anemia requiring  transfusions, requiring inpatient procedures.             Consultants:  Gastroenterology  Procedures:  None  Antimicrobials:  None   Subjective: Patient seen and examined.  Still in the emergency room.  Received 2 units of PRBC.  Feeling hungry.  Denies any nausea vomiting.  Objective: Vitals:   05/25/21 0845 05/25/21 1051 05/25/21 1053 05/25/21 1145  BP: (!) 108/50 (!) 114/57  (!) 116/49  Pulse: (!) 55 (!) 109 64 61  Resp: '16 15 16 15  '$ Temp:      TempSrc:      SpO2: 100%  100% 100%  Weight:      Height:        Intake/Output Summary (Last 24 hours) at 05/25/2021 1326 Last data filed at 05/25/2021 0756 Gross per 24 hour  Intake 1663 ml  Output 200 ml  Net 1463 ml   Filed Weights   05/24/21 1713  Weight: 99.8 kg    Examination:  General: Fairly comfortable at rest. Cardiovascular: S1-S2 normal.  Regular rate rhythm. Respiratory: Bilateral clear. Gastrointestinal: Obese.  Pendulous.  Nontender. Ext: No deformities.  Left ankle with dressing on. Neuro: Intact.      Data Reviewed: I have personally reviewed following labs and imaging studies  CBC: Recent Labs  Lab 05/24/21 1405 05/24/21 1939  WBC 9.3 11.4*  NEUTROABS 6,352 8.0*  HGB 5.7* 5.6*  HCT 19.2* 19.0*  MCV 86.9 89.6  PLT 313 433   Basic Metabolic Panel: Recent Labs  Lab 05/24/21 1405 05/24/21 1939  NA 134* 132*  K 4.1 4.2  CL 99 100  CO2 20 20*  GLUCOSE 206* 167*  BUN 26* 28*  CREATININE 1.12* 1.89*  CALCIUM 8.5* 8.5*   GFR: Estimated Creatinine Clearance: 34.6 mL/min (A) (by C-G formula based on  SCr of 1.89 mg/dL (H)). Liver Function Tests: Recent Labs  Lab 05/24/21 1405 05/24/21 1939  AST 8* 15  ALT 7 13  ALKPHOS  --  80  BILITOT 0.5 0.5  PROT 6.5 6.7  ALBUMIN  --  3.1*   No results for input(s): LIPASE, AMYLASE in the last 168 hours. No results for input(s): AMMONIA in the last 168 hours. Coagulation Profile: No results for input(s): INR, PROTIME in the last  168 hours. Cardiac Enzymes: No results for input(s): CKTOTAL, CKMB, CKMBINDEX, TROPONINI in the last 168 hours. BNP (last 3 results) No results for input(s): PROBNP in the last 8760 hours. HbA1C: No results for input(s): HGBA1C in the last 72 hours. CBG: Recent Labs  Lab 05/24/21 2355 05/25/21 0757 05/25/21 1230  GLUCAP 139* 95 77   Lipid Profile: No results for input(s): CHOL, HDL, LDLCALC, TRIG, CHOLHDL, LDLDIRECT in the last 72 hours. Thyroid Function Tests: No results for input(s): TSH, T4TOTAL, FREET4, T3FREE, THYROIDAB in the last 72 hours. Anemia Panel: No results for input(s): VITAMINB12, FOLATE, FERRITIN, TIBC, IRON, RETICCTPCT in the last 72 hours. Sepsis Labs: No results for input(s): PROCALCITON, LATICACIDVEN in the last 168 hours.  Recent Results (from the past 240 hour(s))  Resp Panel by RT-PCR (Flu A&B, Covid) Nasopharyngeal Swab     Status: None   Collection Time: 05/24/21  5:12 PM   Specimen: Nasopharyngeal Swab; Nasopharyngeal(NP) swabs in vial transport medium  Result Value Ref Range Status   SARS Coronavirus 2 by RT PCR NEGATIVE NEGATIVE Final    Comment: (NOTE) SARS-CoV-2 target nucleic acids are NOT DETECTED.  The SARS-CoV-2 RNA is generally detectable in upper respiratory specimens during the acute phase of infection. The lowest concentration of SARS-CoV-2 viral copies this assay can detect is 138 copies/mL. A negative result does not preclude SARS-Cov-2 infection and should not be used as the sole basis for treatment or other patient management decisions. A negative result may occur with  improper specimen collection/handling, submission of specimen other than nasopharyngeal swab, presence of viral mutation(s) within the areas targeted by this assay, and inadequate number of viral copies(<138 copies/mL). A negative result must be combined with clinical observations, patient history, and epidemiological information. The expected result is  Negative.  Fact Sheet for Patients:  EntrepreneurPulse.com.au  Fact Sheet for Healthcare Providers:  IncredibleEmployment.be  This test is no t yet approved or cleared by the [place] FDA and  has been authorized for detection and/or diagnosis of SARS-CoV-2 by FDA under an Emergency Use Authorization (EUA). This EUA will remain  in effect (meaning this test can be used) for the duration of the COVID-19 declaration under Section 564(b)(1) of the Act, 21 U.S.C.section 360bbb-3(b)(1), unless the authorization is terminated  or revoked sooner.       Influenza A by PCR NEGATIVE NEGATIVE Final   Influenza B by PCR NEGATIVE NEGATIVE Final    Comment: (NOTE) The Xpert Xpress SARS-CoV-2/FLU/RSV plus assay is intended as an aid in the diagnosis of influenza from Nasopharyngeal swab specimens and should not be used as a sole basis for treatment. Nasal washings and aspirates are unacceptable for Xpert Xpress SARS-CoV-2/FLU/RSV testing.  Fact Sheet for Patients: EntrepreneurPulse.com.au  Fact Sheet for Healthcare Providers: IncredibleEmployment.be  This test is not yet approved or cleared by the [place] FDA and has been authorized for detection and/or diagnosis of SARS-CoV-2 by FDA under an Emergency Use Authorization (EUA). This EUA will remain in effect (meaning this test can be used) for the  duration of the COVID-19 declaration under Section 564(b)(1) of the Act, 21 U.S.C. section 360bbb-3(b)(1), unless the authorization is terminated or revoked.  Performed at [facility], [place] [address] [place],  [address]          Radiology Studies: DG Chest 1 View  Result Date: 05/24/2021 CLINICAL DATA:  Nausea and shortness of breath for the past 2 days. EXAM: CHEST  1 VIEW COMPARISON:  Chest x-ray dated November 21, 2020. FINDINGS: Stable cardiomediastinal silhouette. Chronic mild lingular and  left lower lobe atelectasis/scarring. No focal consolidation, pleural effusion, or pneumothorax. No acute osseous abnormality. IMPRESSION: No active disease. Electronically Signed   By: Titus Dubin M.D.   On: 05/24/2021 18:25   NM PULMONARY VENT AND PERF (V/Q Scan)  Result Date: 05/25/2021 CLINICAL DATA:  Shortness of breath and lightheadedness. EXAM: NUCLEAR MEDICINE PERFUSION LUNG SCAN TECHNIQUE: Perfusion images were obtained in multiple projections after intravenous injection of radiopharmaceutical. Ventilation scans intentionally deferred if perfusion scan and chest x-ray adequate for interpretation during COVID 19 epidemic. RADIOPHARMACEUTICALS:  4 mCi Tc-64mMAA IV COMPARISON:  Chest radiograph dated May 24, 2021 FINDINGS: Large cardiac shadow consistent with cardiomegaly. No segmental perfusion defect. IMPRESSION: Normal or very low probability of pulmonary embolism. Cardiomegaly. Electronically Signed   By: IKeane PoliceD.O.   On: 05/25/2021 11:09        Scheduled Meds:  atorvastatin  40 mg Oral QPM   insulin aspart  0-5 Units Subcutaneous QHS   insulin aspart  0-9 Units Subcutaneous TID WC   levothyroxine  125 mcg Oral QAC breakfast   [START ON 05/28/2021] pantoprazole  40 mg Intravenous Q12H   Continuous Infusions:  sodium chloride     lactated ringers 50 mL/hr at 05/25/21 0427   pantoprazole 8 mg/hr (05/25/21 0814)     LOS: 1 day    Time spent: 35 minutes    KBarb Merino MD [facility] Pager [phone]

## 2021-05-26 ENCOUNTER — Encounter (HOSPITAL_COMMUNITY)
Admission: EM | Disposition: A | Discharge status: Home Health | Payer: Self-pay | Source: Home / Self Care | Attending: Internal Medicine

## 2021-05-26 ENCOUNTER — Inpatient Hospital Stay (HOSPITAL_COMMUNITY): Payer: Medicare Other | Admitting: Anesthesiology

## 2021-05-26 DIAGNOSIS — R531 Weakness: Secondary | ICD-10-CM | POA: Diagnosis not present

## 2021-05-26 DIAGNOSIS — K222 Esophageal obstruction: Secondary | ICD-10-CM

## 2021-05-26 DIAGNOSIS — K449 Diaphragmatic hernia without obstruction or gangrene: Secondary | ICD-10-CM

## 2021-05-26 DIAGNOSIS — D62 Acute posthemorrhagic anemia: Secondary | ICD-10-CM

## 2021-05-26 DIAGNOSIS — K2211 Ulcer of esophagus with bleeding: Secondary | ICD-10-CM

## 2021-05-26 HISTORY — PX: ESOPHAGOGASTRODUODENOSCOPY (EGD) WITH PROPOFOL: SHX5813

## 2021-05-26 LAB — GLUCOSE, CAPILLARY
Glucose-Capillary: 125 mg/dL — ABNORMAL HIGH (ref 70–99)
Glucose-Capillary: 125 mg/dL — ABNORMAL HIGH (ref 70–99)
Glucose-Capillary: 76 mg/dL (ref 70–99)
Glucose-Capillary: 82 mg/dL (ref 70–99)
Glucose-Capillary: 83 mg/dL (ref 70–99)
Glucose-Capillary: 96 mg/dL (ref 70–99)

## 2021-05-26 LAB — BPAM RBC
Blood Product Expiration Date: 202304052359
Blood Product Expiration Date: 202304052359
ISSUE DATE / TIME: 202303032156
ISSUE DATE / TIME: 202303040137
Unit Type and Rh: 5100
Unit Type and Rh: 5100

## 2021-05-26 LAB — TYPE AND SCREEN
ABO/RH(D): O POS
Antibody Screen: NEGATIVE
Unit division: 0
Unit division: 0

## 2021-05-26 LAB — CBC WITH DIFFERENTIAL/PLATELET
Abs Immature Granulocytes: 0.6 10*3/uL — ABNORMAL HIGH (ref 0.00–0.07)
Basophils Absolute: 0 10*3/uL (ref 0.0–0.1)
Basophils Relative: 0 %
Eosinophils Absolute: 0.3 10*3/uL (ref 0.0–0.5)
Eosinophils Relative: 2 %
HCT: 25.5 % — ABNORMAL LOW (ref 36.0–46.0)
Hemoglobin: 8.5 g/dL — ABNORMAL LOW (ref 12.0–15.0)
Lymphocytes Relative: 12 %
Lymphs Abs: 1.7 10*3/uL (ref 0.7–4.0)
MCH: 28.9 pg (ref 26.0–34.0)
MCHC: 33.3 g/dL (ref 30.0–36.0)
MCV: 86.7 fL (ref 80.0–100.0)
Metamyelocytes Relative: 1 %
Monocytes Absolute: 0.3 10*3/uL (ref 0.1–1.0)
Monocytes Relative: 2 %
Myelocytes: 3 %
Neutro Abs: 11.4 10*3/uL — ABNORMAL HIGH (ref 1.7–7.7)
Neutrophils Relative %: 80 %
Platelets: 230 10*3/uL (ref 150–400)
RBC: 2.94 MIL/uL — ABNORMAL LOW (ref 3.87–5.11)
RDW: 17 % — ABNORMAL HIGH (ref 11.5–15.5)
WBC: 14.3 10*3/uL — ABNORMAL HIGH (ref 4.0–10.5)
nRBC: 0 /100 WBC
nRBC: 0.2 % (ref 0.0–0.2)

## 2021-05-26 LAB — COMPREHENSIVE METABOLIC PANEL
ALT: 12 U/L (ref 0–44)
AST: 18 U/L (ref 15–41)
Albumin: 2.5 g/dL — ABNORMAL LOW (ref 3.5–5.0)
Alkaline Phosphatase: 72 U/L (ref 38–126)
Anion gap: 9 (ref 5–15)
BUN: 11 mg/dL (ref 8–23)
CO2: 24 mmol/L (ref 22–32)
Calcium: 8.1 mg/dL — ABNORMAL LOW (ref 8.9–10.3)
Chloride: 106 mmol/L (ref 98–111)
Creatinine, Ser: 0.9 mg/dL (ref 0.44–1.00)
GFR, Estimated: >60 mL/min (ref >60)
Glucose, Bld: 79 mg/dL (ref 70–99)
Potassium: 4 mmol/L (ref 3.5–5.1)
Sodium: 139 mmol/L (ref 135–145)
Total Bilirubin: 1.1 mg/dL (ref 0.3–1.2)
Total Protein: 5.4 g/dL — ABNORMAL LOW (ref 6.5–8.1)

## 2021-05-26 LAB — MAGNESIUM: Magnesium: 2.2 mg/dL (ref 1.7–2.4)

## 2021-05-26 LAB — PHOSPHORUS: Phosphorus: 2.3 mg/dL — ABNORMAL LOW (ref 2.5–4.6)

## 2021-05-26 SURGERY — ESOPHAGOGASTRODUODENOSCOPY (EGD) WITH PROPOFOL
Anesthesia: Monitor Anesthesia Care

## 2021-05-26 MED ORDER — LIDOCAINE 2% (20 MG/ML) 5 ML SYRINGE
INTRAMUSCULAR | Status: DC | PRN
  Administered 2021-05-26: 30 mg via INTRAVENOUS

## 2021-05-26 MED ORDER — SODIUM CHLORIDE 0.9 % IV SOLN: INTRAVENOUS | Status: DC | PRN

## 2021-05-26 MED ORDER — SODIUM CHLORIDE 0.9 % IV SOLN: INTRAVENOUS | Status: DC

## 2021-05-26 MED ORDER — PROPOFOL 500 MG/50ML IV EMUL
INTRAVENOUS | Status: DC | PRN
  Administered 2021-05-26: 100 ug/kg/min via INTRAVENOUS

## 2021-05-26 MED ORDER — ONDANSETRON HCL 4 MG/2ML IJ SOLN
INTRAMUSCULAR | Status: DC | PRN
  Administered 2021-05-26: 4 mg via INTRAVENOUS

## 2021-05-26 MED ORDER — PANTOPRAZOLE SODIUM 40 MG IV SOLR
40.0000 mg | Freq: Two times a day (BID) | INTRAVENOUS | Status: DC
  Administered 2021-05-26 – 2021-05-27 (×3): 40 mg via INTRAVENOUS
  Filled 2021-05-26 (×3): qty 10

## 2021-05-26 MED ORDER — PROPOFOL 10 MG/ML IV BOLUS
INTRAVENOUS | Status: DC | PRN
  Administered 2021-05-26: 30 mg via INTRAVENOUS

## 2021-05-26 SURGICAL SUPPLY — 15 items

## 2021-05-26 NOTE — Evaluation (Signed)
Occupational Therapy Evaluation ?Patient Details ?Name: Tammy Good ?MRN: [id number] ?DOB: 13-Oct-1950 ?Today's Date: 05/26/2021 ? ? ?History of Present Illness 71 y.o. female who presented from home via EMS 05/24/21 with complaints of gradually worsening generalized weakness and nausea with poor intake. Hgb 5.6 (8.5 after 2 units of PRBC), acute renal insufficiency, hypovolemic, V/Q scan negative for PE   PMH significant for left ankle infection, MRSA bacteremia (04/22/21) tenosynovitis of left ankle status post I&D, hypertension, hyperlipidemia, type 2 diabetes, anemia, paroxysmal A-fib on Eliquis, coronary artery disease status post PCI with stent, obesity  ? ?Clinical Impression ?  ?Pt admitted for concerns listed above. PTA pt reported that she was independent with all ADL's and IADL's, including driving and grocery shopping. At this time, pt presents with increased weakness and balance deficits. Pt educated on using RW until she is back to her baseline to improve safety, as well as independence. Pt requiring min guard for functional mobility and transfers at this time, without RW. OT will continue to follow acutely to maximize pt's independence and activity tolerance.   ?   ? ?Recommendations for follow up therapy are one component of a multi-disciplinary discharge planning process, led by the attending physician.  Recommendations may be updated based on patient status, additional functional criteria and insurance authorization.  ? ?Follow Up Recommendations ? No OT follow up  ?  ?Assistance Recommended at Discharge PRN  ?Patient can return home with the following A little help with walking and/or transfers;A little help with bathing/dressing/bathroom ? ?  ?Functional Status Assessment ? Patient has had a recent decline in their functional status and demonstrates the ability to make significant improvements in function in a reasonable and predictable amount of time.  ?Equipment Recommendations ? None  recommended by OT  ?  ?Recommendations for Other Services   ? ? ?  ?Precautions / Restrictions Precautions ?Precautions: Fall ?Precaution Comments: reports one fall ?Restrictions ?Weight Bearing Restrictions: No  ? ?  ? ?Mobility Bed Mobility ?Overal bed mobility: Modified Independent ?  ?  ?  ?  ?  ?  ?General bed mobility comments: HOB flat, no rail ?  ? ?Transfers ?Overall transfer level: Needs assistance ?Equipment used: 1 person hand held assist ?Transfers: Sit to/from Stand ?Sit to Stand: Min guard ?  ?  ?  ?  ?  ?General transfer comment: pt reaching for assist due to feeling weak ?  ? ?  ?Balance Overall balance assessment: Needs assistance ?Sitting-balance support: No upper extremity supported, Feet supported ?Sitting balance-Leahy Scale: Good ?  ?  ?Standing balance support: Single extremity supported, During functional activity ?Standing balance-Leahy Scale: Poor ?Standing balance comment: seeking UE support in static standing; bil UE support for dynamic balance ?  ?  ?  ?  ?  ?  ?  ?  ?  ?  ?  ?   ? ?ADL either performed or assessed with clinical judgement  ? ?ADL Overall ADL's : Modified independent;At baseline ?  ?  ?  ?  ?  ?  ?  ?  ?  ?  ?  ?  ?  ?  ?  ?  ?  ?  ?  ?General ADL Comments: Pt presents with mild weakness, however was able to complete BADL's with no assist. Does best with RW for safety and balance  ? ? ? ?Vision Baseline Vision/History: 1 Wears glasses ?Ability to See in Adequate Light: 0 Adequate ?Patient Visual Report: No change from baseline ?Vision  Assessment?: No apparent visual deficits  ?   ?Perception   ?  ?Praxis   ?  ? ?Pertinent Vitals/Pain Pain Assessment ?Pain Assessment: No/denies pain  ? ? ? ?Hand Dominance Right ?  ?Extremity/Trunk Assessment Upper Extremity Assessment ?Upper Extremity Assessment: Overall WFL for tasks assessed ?  ?Lower Extremity Assessment ?Lower Extremity Assessment: Defer to PT evaluation ?  ?Cervical / Trunk Assessment ?Cervical / Trunk Assessment:  Normal ?  ?Communication Communication ?Communication: HOH ?  ?Cognition Arousal/Alertness: Awake/alert ?Behavior During Therapy: [facility] for tasks assessed/performed ?Overall Cognitive Status: Within Functional Limits for tasks assessed ?  ?  ?  ?  ?  ?  ?  ?  ?  ?  ?  ?  ?  ?  ?  ?  ?  ?  ?  ?General Comments  VSS on RA ? ?  ?Exercises   ?  ?Shoulder Instructions    ? ? ?Home Living Family/patient expects to be discharged to:: Private residence ?Living Arrangements: Alone ?Available Help at Discharge: Family;Available PRN/intermittently ?Type of Home: House ?Home Access: Stairs to enter ?Entrance Stairs-Number of Steps: 3-4 at the front, 4 through the garage (typical entrance) ?Entrance Stairs-Rails: None ?Home Layout: One level ?  ?  ?Bathroom Shower/Tub: Walk-in shower ?  ?Bathroom Toilet: Handicapped height ?Bathroom Accessibility: Yes ?How Accessible: Accessible via walker ?Home Equipment: Shower seat;Cane - single point;Rolling Walker (2 wheels);Hand held shower head ?  ?  ?  ? ?  ?Prior Functioning/Environment Prior Level of Function : Independent/Modified Independent;Driving ?  ?  ?  ?  ?  ?  ?Mobility Comments: using cane since ankle issues; prior did not use ?ADLs Comments: sits to shower; drives; grocery shopping ?  ? ?  ?  ?OT Problem List: Decreased strength;Impaired balance (sitting and/or standing);Decreased activity tolerance ?  ?   ?OT Treatment/Interventions: Self-care/ADL training;Therapeutic exercise;Energy conservation;DME and/or AE instruction;Therapeutic activities;Patient/family education;Balance training  ?  ?OT Goals(Current goals can be found in the care plan section) Acute Rehab OT Goals ?Patient Stated Goal: To get stronger ?OT Goal Formulation: With patient ?Time For Goal Achievement: 06/09/21 ?Potential to Achieve Goals: Good ?ADL Goals ?Pt Will Transfer to Toilet: with modified independence;ambulating ?Pt Will Perform Toileting - Clothing Manipulation and hygiene: with modified  independence;sitting/lateral leans;sit to/from stand ?Additional ADL Goal #1: Pt will report 3 fall prevention techniques that she can use at home  ?OT Frequency: Min 2X/week ?  ? ?Co-evaluation   ?  ?  ?  ?  ? ?  ?AM-PAC OT "6 Clicks" Daily Activity     ?Outcome Measure Help from another person eating meals?: None ?Help from another person taking care of personal grooming?: None ?Help from another person toileting, which includes using toliet, bedpan, or urinal?: None ?Help from another person bathing (including washing, rinsing, drying)?: None ?Help from another person to put on and taking off regular upper body clothing?: None ?Help from another person to put on and taking off regular lower body clothing?: None ?6 Click Score: 24 ?  ?End of Session Nurse Communication: Mobility status ? ?Activity Tolerance: Patient tolerated treatment well ?Patient left: in bed;with bed alarm set;with call bell/phone within reach ? ?OT Visit Diagnosis: Unsteadiness on feet (R26.81);Other abnormalities of gait and mobility (R26.89);Muscle weakness (generalized) (M62.81)  ?              ?Time: 7262-0355 ?OT Time Calculation (min): 11 min ?Charges:  OT General Charges ?$OT Visit: 1 Visit ?OT Evaluation ?$OT Eval Moderate Complexity:  1 Mod ? ?Kotaro Buer H., OTR/L ?Acute Rehabilitation ? ?Tammy Good ?05/26/2021, 3:33 PM ?

## 2021-05-26 NOTE — Interval H&P Note (Signed)
History and Physical Interval Note: ? ?05/26/2021 ?10:10 AM ? ?Tammy Good  has presented today for surgery, with the diagnosis of Anemia.  The various methods of treatment have been discussed with the patient and family. After consideration of risks, benefits and other options for treatment, the patient has consented to  Procedure(s): ?ESOPHAGOGASTRODUODENOSCOPY (EGD) WITH PROPOFOL (N/A) as a surgical intervention.  The patient's history has been reviewed, patient examined, no change in status, stable for surgery.  I have reviewed the patient's chart and labs.  Questions were answered to the patient's satisfaction.   ? ? ?Samiha Denapoli D ? ? ?

## 2021-05-26 NOTE — Progress Notes (Signed)
PROGRESS NOTE    Tammy Good  NFA:[id number] DOB: 1950/09/07 DOA: 05/24/2021 PCP: [name], MD    Brief Narrative:  71 year old female with history of left ankle infection, MRSA bacteremia and tenosynovitis of the left ankle status post I&D, essential hypertension, hyperlipidemia, type 2 diabetes, chronic iron deficiency anemia with baseline hemoglobin about 8, hypothyroidism, paroxysmal A-fib on Eliquis, GERD, coronary artery disease status post PCI with stent presented from home with gradually worsening generalized weakness for 2 days, poor appetite and ongoing chronic nausea.  At the emergency room blood pressure stable.  Hemoglobin 5.6, creatinine 1.89 from baseline 0.78.   Assessment & Plan:  Symptomatic anemia, acute blood loss anemia with history of chronic iron deficiency anemia: Currently hemodynamically stable. Presentation hemoglobin 5.6-2 units of PRBC transfused-posttransfusion hemoglobin more than 8.  Appropriately responded.  Upper GI endoscopy today with esophageal ulcer, no active bleeding. Changed to Protonix IV twice daily.  Recheck hemoglobin tomorrow morning.  Advance diet and monitor.  We will keep off Eliquis.  Acute kidney injury likely prerenal injury in the setting of dehydration and poor oral intake: #1.  IV fluids and monitoring.  Normalized.  Type 2 diabetes with hyperglycemia: Currently fairly stable.  On sliding scale insulin.  Essential hypertension: Holding home hypertensives.  Risk of hypotension.  If adequate, will resume on discharge tomorrow.  Hypothyroidism: Resume home Synthroid.  Paroxysmal A-fib: Rate controlled in sinus rhythm.  Eliquis on hold.  We will continue to hold until outpatient follow-up.  Left ankle infection: Completed antibiotic therapy.  Local wound care.   DVT prophylaxis: SCDs Start: 05/24/21 2214   Code Status: Full code Family Communication: None Disposition Plan: Status is: Inpatient Remains inpatient  appropriate because: Severe anemia requiring transfusions, requiring inpatient procedures.  Plan for endoscopy today.             Consultants:  Gastroenterology  Procedures:  Upper GI endoscopy.  Antimicrobials:  None   Subjective:  Patient seen and examined in the morning rounds.  Denies any complaints.  By the time of this note was created, patient underwent upper GI endoscopy and found to have duodenal ulcer but not actively bleeding.  Objective: Vitals:   05/26/21 0819 05/26/21 1019 05/26/21 1054 05/26/21 1109  BP: (!) 156/58 (!) 141/58 (!) 108/47 (!) 114/48  Pulse: 96 90    Resp: 20 17 (!) 21 20  Temp: 98.5 F (36.9 C) 97.9 F (36.6 C) 98.4 F (36.9 C)   TempSrc: Oral Temporal    SpO2: 93% 93% 100% 98%  Weight:      Height:        Intake/Output Summary (Last 24 hours) at 05/26/2021 1123 Last data filed at 05/26/2021 1052 Gross per 24 hour  Intake 2160.81 ml  Output 1300 ml  Net 860.81 ml   Filed Weights   05/24/21 1713 05/25/21 1330  Weight: 99.8 kg 96.5 kg    Examination:  General: Fairly comfortable at rest. Cardiovascular: S1-S2 normal.  Regular rate rhythm. Respiratory: Bilateral clear. Gastrointestinal: Obese.  Pendulous.  Nontender. Ext: No deformities.  Left ankle with dressing on. Neuro: Intact.      Data Reviewed: I have personally reviewed following labs and imaging studies  CBC: Recent Labs  Lab 05/24/21 1405 05/24/21 1939 05/25/21 1447 05/26/21 0429  WBC 9.3 11.4*  --  14.3*  NEUTROABS 6,352 8.0*  --  11.4*  HGB 5.7* 5.6* 8.5* 8.5*  HCT 19.2* 19.0* 25.9* 25.5*  MCV 86.9 89.6  --  86.7  PLT  313 300  --  654   Basic Metabolic Panel: Recent Labs  Lab 05/24/21 1405 05/24/21 1939 05/26/21 0146  NA 134* 132* 139  K 4.1 4.2 4.0  CL 99 100 106  CO2 20 20* 24  GLUCOSE 206* 167* 79  BUN 26* 28* 11  CREATININE 1.12* 1.89* 0.90  CALCIUM 8.5* 8.5* 8.1*  MG  --   --  2.2  PHOS  --   --  2.3*   GFR: Estimated  Creatinine Clearance: 71.4 mL/min (by C-G formula based on SCr of 0.9 mg/dL). Liver Function Tests: Recent Labs  Lab 05/24/21 1405 05/24/21 1939 05/26/21 0146  AST 8* 15 18  ALT '7 13 12  '$ ALKPHOS  --  80 72  BILITOT 0.5 0.5 1.1  PROT 6.5 6.7 5.4*  ALBUMIN  --  3.1* 2.5*   No results for input(s): LIPASE, AMYLASE in the last 168 hours. No results for input(s): AMMONIA in the last 168 hours. Coagulation Profile: No results for input(s): INR, PROTIME in the last 168 hours. Cardiac Enzymes: No results for input(s): CKTOTAL, CKMB, CKMBINDEX, TROPONINI in the last 168 hours. BNP (last 3 results) No results for input(s): PROBNP in the last 8760 hours. HbA1C: No results for input(s): HGBA1C in the last 72 hours. CBG: Recent Labs  Lab 05/25/21 1944 05/26/21 0001 05/26/21 0412 05/26/21 0816 05/26/21 1100  GLUCAP 144* 83 82 96 76   Lipid Profile: No results for input(s): CHOL, HDL, LDLCALC, TRIG, CHOLHDL, LDLDIRECT in the last 72 hours. Thyroid Function Tests: No results for input(s): TSH, T4TOTAL, FREET4, T3FREE, THYROIDAB in the last 72 hours. Anemia Panel: No results for input(s): VITAMINB12, FOLATE, FERRITIN, TIBC, IRON, RETICCTPCT in the last 72 hours. Sepsis Labs: No results for input(s): PROCALCITON, LATICACIDVEN in the last 168 hours.  Recent Results (from the past 240 hour(s))  Resp Panel by RT-PCR (Flu A&B, Covid) Nasopharyngeal Swab     Status: None   Collection Time: 05/24/21  5:12 PM   Specimen: Nasopharyngeal Swab; Nasopharyngeal(NP) swabs in vial transport medium  Result Value Ref Range Status   SARS Coronavirus 2 by RT PCR NEGATIVE NEGATIVE Final    Comment: (NOTE) SARS-CoV-2 target nucleic acids are NOT DETECTED.  The SARS-CoV-2 RNA is generally detectable in upper respiratory specimens during the acute phase of infection. The lowest concentration of SARS-CoV-2 viral copies this assay can detect is 138 copies/mL. A negative result does not preclude  SARS-Cov-2 infection and should not be used as the sole basis for treatment or other patient management decisions. A negative result may occur with  improper specimen collection/handling, submission of specimen other than nasopharyngeal swab, presence of viral mutation(s) within the areas targeted by this assay, and inadequate number of viral copies(<138 copies/mL). A negative result must be combined with clinical observations, patient history, and epidemiological information. The expected result is Negative.  Fact Sheet for Patients:  EntrepreneurPulse.com.au  Fact Sheet for Healthcare Providers:  IncredibleEmployment.be  This test is no t yet approved or cleared by the [place] FDA and  has been authorized for detection and/or diagnosis of SARS-CoV-2 by FDA under an Emergency Use Authorization (EUA). This EUA will remain  in effect (meaning this test can be used) for the duration of the COVID-19 declaration under Section 564(b)(1) of the Act, 21 U.S.C.section 360bbb-3(b)(1), unless the authorization is terminated  or revoked sooner.       Influenza A by PCR NEGATIVE NEGATIVE Final   Influenza B by PCR NEGATIVE NEGATIVE Final  Comment: (NOTE) The Xpert Xpress SARS-CoV-2/FLU/RSV plus assay is intended as an aid in the diagnosis of influenza from Nasopharyngeal swab specimens and should not be used as a sole basis for treatment. Nasal washings and aspirates are unacceptable for Xpert Xpress SARS-CoV-2/FLU/RSV testing.  Fact Sheet for Patients: EntrepreneurPulse.com.au  Fact Sheet for Healthcare Providers: IncredibleEmployment.be  This test is not yet approved or cleared by the [place] FDA and has been authorized for detection and/or diagnosis of SARS-CoV-2 by FDA under an Emergency Use Authorization (EUA). This EUA will remain in effect (meaning this test can be used) for the duration of  the COVID-19 declaration under Section 564(b)(1) of the Act, 21 U.S.C. section 360bbb-3(b)(1), unless the authorization is terminated or revoked.  Performed at [facility], [place] [address], [place], [place] [address]          Radiology Studies: DG Chest 1 View  Result Date: 05/24/2021 CLINICAL DATA:  Nausea and shortness of breath for the past 2 days. EXAM: CHEST  1 VIEW COMPARISON:  Chest x-ray dated November 21, 2020. FINDINGS: Stable cardiomediastinal silhouette. Chronic mild lingular and left lower lobe atelectasis/scarring. No focal consolidation, pleural effusion, or pneumothorax. No acute osseous abnormality. IMPRESSION: No active disease. Electronically Signed   By: Titus Dubin M.D.   On: 05/24/2021 18:25   NM PULMONARY VENT AND PERF (V/Q Scan)  Result Date: 05/25/2021 CLINICAL DATA:  Shortness of breath and lightheadedness. EXAM: NUCLEAR MEDICINE PERFUSION LUNG SCAN TECHNIQUE: Perfusion images were obtained in multiple projections after intravenous injection of radiopharmaceutical. Ventilation scans intentionally deferred if perfusion scan and chest x-ray adequate for interpretation during COVID 19 epidemic. RADIOPHARMACEUTICALS:  4 mCi Tc-37mMAA IV COMPARISON:  Chest radiograph dated May 24, 2021 FINDINGS: Large cardiac shadow consistent with cardiomegaly. No segmental perfusion defect. IMPRESSION: Normal or very low probability of pulmonary embolism. Cardiomegaly. Electronically Signed   By: IKeane PoliceD.O.   On: 05/25/2021 11:09        Scheduled Meds:  [MAR Hold] atorvastatin  40 mg Oral QPM   [MAR Hold] insulin aspart  0-5 Units Subcutaneous QHS   [MAR Hold] insulin aspart  0-9 Units Subcutaneous TID WC   [MAR Hold] levothyroxine  125 mcg Oral QAC breakfast   pantoprazole (PROTONIX) IV  40 mg Intravenous Q12H   Continuous Infusions:  [MAR Hold] sodium chloride     sodium chloride     lactated ringers 50 mL/hr at 05/25/21 0427     LOS: 2 days    Time  spent: 35 minutes    KBarb Merino MD [facility] Pager [phone]

## 2021-05-26 NOTE — Anesthesia Postprocedure Evaluation (Signed)
Anesthesia Post Note ? ?Patient: MAGALLY VAHLE ? ?Procedure(s) Performed: ESOPHAGOGASTRODUODENOSCOPY (EGD) WITH PROPOFOL ? ?  ? ?Patient location during evaluation: PACU ?Anesthesia Type: MAC ?Level of consciousness: awake and alert ?Pain management: pain level controlled ?Vital Signs Assessment: post-procedure vital signs reviewed and stable ?Respiratory status: spontaneous breathing, nonlabored ventilation, respiratory function stable and patient connected to nasal cannula oxygen ?Cardiovascular status: stable and blood pressure returned to baseline ?Postop Assessment: no apparent nausea or vomiting ?Anesthetic complications: no ? ? ?No notable events documented. ? ?Last Vitals:  ?Vitals:  ? 05/26/21 1115 05/26/21 1255  ?BP: (!) 125/50 (!) 114/53  ?Pulse: 91 (!) 101  ?Resp: (!) 21 16  ?Temp: 36.9 ?C 36.6 ?C  ?SpO2: 94% 98%  ?  ?Last Pain:  ?Vitals:  ? 05/26/21 1115  ?TempSrc:   ?PainSc: 0-No pain  ? ? ?  ?  ?  ?  ?  ?  ? ?Suzette Battiest E ? ? ? ? ?

## 2021-05-26 NOTE — Op Note (Signed)
[facility] ?Patient Name: Tammy Good ?Procedure Date : 05/26/2021 ?MRN: [id number] ?Attending MD: Carol Ada , MD ?Date of Birth: Aug 04, 1950 ?CSN: [id number] ?Age: 71 ?Admit Type: Inpatient ?Procedure:                Upper GI endoscopy ?Indications:              Acute post hemorrhagic anemia, Melena ?Providers:                Carol Ada, MD, Grace Isaac, RN, Frazier Richards,  ?                          Technician ?Referring MD:              ?Medicines:                Propofol per Anesthesia ?Complications:            No immediate complications. ?Estimated Blood Loss:     Estimated blood loss: none. ?Procedure:                Pre-Anesthesia Assessment: ?                          - Prior to the procedure, a History and Physical  ?                          was performed, and patient medications and  ?                          allergies were reviewed. The patient's tolerance of  ?                          previous anesthesia was also reviewed. The risks  ?                          and benefits of the procedure and the sedation  ?                          options and risks were discussed with the patient.  ?                          All questions were answered, and informed consent  ?                          was obtained. Prior Anticoagulants: The patient has  ?                          taken no previous anticoagulant or antiplatelet  ?                          agents. ASA Grade Assessment: III - A patient with  ?                          severe systemic disease. After reviewing the risks  ?  and benefits, the patient was deemed in  ?                          satisfactory condition to undergo the procedure. ?                          - Sedation was administered by an anesthesia  ?                          professional. Deep sedation was attained. ?                          After obtaining informed consent, the endoscope was  ?                          passed under direct  vision. Throughout the  ?                          procedure, the patient's blood pressure, pulse, and  ?                          oxygen saturations were monitored continuously. The  ?                          [id number]-[id number] ([id number]) Olympus endoscope was introduced  ?                          through the mouth, and advanced to the second part  ?                          of duodenum. The upper GI endoscopy was  ?                          accomplished without difficulty. The patient  ?                          tolerated the procedure well. ?Scope In: ?Scope Out: ?Findings: ?     One superficial esophageal ulcer with no stigmata of recent bleeding was  ?     found in the lower third of the esophagus. The lesion was 5 mm in  ?     largest dimension. ?     One benign-appearing, intrinsic mild stenosis was found in the lower  ?     third of the esophagus. This stenosis measured 1.3 cm (inner diameter) x  ?     less than one cm (in length). The stenosis was traversed. ?     A 3 cm hiatal hernia was present. ?     The stomach was normal. ?     The examined duodenum was normal. ?     A small benign appearing distal esophageal ulcer was found. This was not  ?     bleeding, but certainly the source of her worsened anemia. ?Impression:               - Esophageal ulcer with no stigmata of recent  ?  bleeding. ?                          - Benign-appearing esophageal stenosis. ?                          - 3 cm hiatal hernia. ?                          - Normal stomach. ?                          - Normal examined duodenum. ?                          - No specimens collected. ?Recommendation:           - Return patient to hospital ward for ongoing care. ?                          - Resume regular diet. ?                          - Continue present medications. ?                          - Return to GI clinic in 2 weeks. ?                          - PPI QD indefinitely. ?                          - Signing  off. ?Procedure Code(s):        --- Professional --- ?                          [phone], Esophagogastroduodenoscopy, flexible,  ?                          transoral; diagnostic, including collection of  ?                          specimen(s) by brushing or washing, when performed  ?                          (separate procedure) ?Diagnosis Code(s):        --- Professional --- ?                          K22.10, Ulcer of esophagus without bleeding ?                          K22.2, Esophageal obstruction ?                          K44.9, Diaphragmatic hernia without obstruction or  ?                          gangrene ?  D62, Acute posthemorrhagic anemia ?                          K92.1, Melena (includes Hematochezia) ?CPT copyright 2019 American Medical Association. All rights reserved. ?The codes documented in this report are preliminary and upon coder review may  ?be revised to meet current compliance requirements. ?Carol Ada, MD ?Carol Ada, MD ?05/26/2021 11:12:43 AM ?This report has been signed electronically. ?Number of Addenda: 0 ?

## 2021-05-26 NOTE — Anesthesia Procedure Notes (Signed)
Procedure Name: [place] ?Date/Time: 05/26/2021 10:20 AM ?Performed by: Eligha Bridegroom, CRNA ?Pre-anesthesia Checklist: Patient identified, Emergency Drugs available, Suction available, Patient being monitored and Timeout performed ?Patient Re-evaluated:Patient Re-evaluated prior to induction ?Oxygen Delivery Method: Nasal cannula ?Preoxygenation: Pre-oxygenation with 100% oxygen ?Induction Type: IV induction ? ? ? ? ?

## 2021-05-26 NOTE — Anesthesia Preprocedure Evaluation (Signed)
Anesthesia Evaluation  ?Patient identified by MRN, date of birth, ID band ?Patient awake ? ? ? ?Reviewed: ?Allergy & Precautions, NPO status , Patient's Chart, lab work & pertinent test results ? ?History of Anesthesia Complications ?Negative for: history of anesthetic complications ? ?Airway ?Mallampati: III ? ?TM Distance: >3 FB ?Neck ROM: Full ? ? ? Dental ?no notable dental hx. ?(+) Dental Advisory Given ?  ?Pulmonary ?former smoker,  ?  ?Pulmonary exam normal ? ? ? ? ? ? ? Cardiovascular ?hypertension, Pt. on medications and Pt. on home beta blockers ?+ CAD, + Past MI, + Cardiac Stents and + Peripheral Vascular Disease  ?Normal cardiovascular exam ? ?Limited Echo 04/24/2021 ??1. Left ventricular ejection fraction, by estimation, is 40 to 45%. The left ventricle has mildly decreased function. There is mild concentric left ventricular hypertrophy. Left ventricular diastolic parameters are indeterminate.  ??2. The mitral valve was not well visualized. No evidence of mitral valve regurgitation.  ??3. The aortic valve is tricuspid.  ? ?Echo 10/2020 ??1. Left ventricular ejection fraction, by estimation, is 40 to 45%. The left ventricle has mildly decreased function. The left ventricle demonstrates regional wall motion abnormalities (see scoring diagram/findings for description). Left ventricular diastolic parameters are consistent with Grade I diastolic dysfunction (impaired relaxation). There is severe hypokinesis of the left ventricular, entire inferolateral wall and inferior wall.  ??2. Right ventricular systolic function is normal. The right ventricular size is normal. Tricuspid regurgitation signal is inadequate for assessing PA pressure.  ??3. Left atrial size was moderately dilated.  ??4. The mitral valve is grossly normal. No evidence of mitral valve regurgitation.  ??5. The aortic valve was not well visualized. Aortic valve regurgitation is not visualized.  ??6. The inferior  vena cava is normal in size with greater than 50% respiratory variability, suggesting right atrial pressure of 3 mmHg.  ?  ?Neuro/Psych ?negative neurological ROS ?   ? GI/Hepatic ?negative GI ROS, Neg liver ROS,   ?Endo/Other  ?diabetesHypothyroidism  ? Renal/GU ?negative Renal ROS  ? ?  ?Musculoskeletal ? ? Abdominal ?(+) + obese,   ?Peds ? Hematology ? ?(+) Blood dyscrasia, anemia ,   ?Anesthesia Other Findings ? ? Reproductive/Obstetrics ? ?  ? ? ? ? ? ? ? ? ? ? ? ? ? ?  ?  ? ? ? ? ? ? ? ? ?Anesthesia Physical ? ?Anesthesia Plan ? ?ASA: 3 ? ?Anesthesia Plan: MAC  ? ?Post-op Pain Management: Minimal or no pain anticipated  ? ?Induction: Intravenous ? ?PONV Risk Score and Plan: 2 and Propofol infusion, Ondansetron, Treatment may vary due to age or medical condition and TIVA ? ?Airway Management Planned: Nasal Cannula and Simple Face Mask ? ?Additional Equipment:  ? ?Intra-op Plan:  ? ?Post-operative Plan:  ? ?Informed Consent: I have reviewed the patients History and Physical, chart, labs and discussed the procedure including the risks, benefits and alternatives for the proposed anesthesia with the patient or authorized representative who has indicated his/her understanding and acceptance.  ? ? ? ?Dental advisory given ? ?Plan Discussed with: Anesthesiologist and CRNA ? ?Anesthesia Plan Comments:   ? ? ? ? ? ? ?Anesthesia Quick Evaluation ? ?

## 2021-05-26 NOTE — Evaluation (Signed)
Physical Therapy Evaluation ?Patient Details ?Name: Tammy Good ?MRN: [id number] ?DOB: March 24, 1951 ?Today's Date: 05/26/2021 ? ?History of Present Illness ? 71 y.o. female who presented from home via EMS 05/24/21 with complaints of gradually worsening generalized weakness and nausea with poor intake. Hgb 5.6 (8.5 after 2 units of PRBC), acute renal insufficiency, hypovolemic, V/Q scan negative for PE   PMH significant for left ankle infection, MRSA bacteremia (04/22/21) tenosynovitis of left ankle status post I&D, hypertension, hyperlipidemia, type 2 diabetes, anemia, paroxysmal A-fib on Eliquis, coronary artery disease status post PCI with stent, obesity  ?Clinical Impression ?  ?Pt admitted secondary to problem above with deficits below. PTA patient was living alone, completely independent with mobility, ADLs, and IADLs. She had begun to use a cane due to recent ankle infection.  Pt currently requires min assist with RW for ambulation x 90 ft with pt limited by generalized weakness and severe nausea.  Anticipate patient will benefit from PT to address problems listed below. Will continue to follow acutely to maximize functional mobility independence and safety.   ?   ?   ? ?Recommendations for follow up therapy are one component of a multi-disciplinary discharge planning process, led by the attending physician.  Recommendations may be updated based on patient status, additional functional criteria and insurance authorization. ? ?Follow Up Recommendations No PT follow up ? ?  ?Assistance Recommended at Discharge PRN  ?Patient can return home with the following ? Assistance with cooking/housework;Assist for transportation;Help with stairs or ramp for entrance ? ?  ?Equipment Recommendations None recommended by PT  ?Recommendations for Other Services ? OT consult  ?  ?Functional Status Assessment Patient has had a recent decline in their functional status and demonstrates the ability to make significant improvements  in function in a reasonable and predictable amount of time.  ? ?  ?Precautions / Restrictions Precautions ?Precautions: Fall ?Precaution Comments: reports one fall  ? ?  ? ?Mobility ? Bed Mobility ?Overal bed mobility: Modified Independent ?  ?  ?  ?  ?  ?  ?General bed mobility comments: HOB flat, no rail ?  ? ?Transfers ?Overall transfer level: Needs assistance ?Equipment used: 1 person hand held assist ?Transfers: Sit to/from Stand ?Sit to Stand: Min assist ?  ?  ?  ?  ?  ?General transfer comment: pt reaching for assist due to feeling weak ?  ? ?Ambulation/Gait ?Ambulation/Gait assistance: Min assist ?Gait Distance (Feet): 90 Feet ?Assistive device: Rolling walker (2 wheels), 1 person hand held assist ?Gait Pattern/deviations: Step-through pattern, Decreased stride length, Shuffle ?Gait velocity: decr ?Gait velocity interpretation: 1.31 - 2.62 ft/sec, indicative of limited community ambulator ?  ?General Gait Details: slightly flexed trunk; reports feeling generally weak but denies dizziness ? ?Stairs ?  ?  ?  ?  ?  ? ?Wheelchair Mobility ?  ? ?Modified Rankin (Stroke Patients Only) ?  ? ?  ? ?Balance Overall balance assessment: Needs assistance ?Sitting-balance support: No upper extremity supported, Feet supported ?Sitting balance-Leahy Scale: Good ?  ?  ?Standing balance support: Single extremity supported, During functional activity ?Standing balance-Leahy Scale: Poor ?Standing balance comment: seeking UE support in static standing; bil UE support for dynamic balance ?  ?  ?  ?  ?  ?  ?  ?  ?  ?  ?  ?   ? ? ? ?Pertinent Vitals/Pain Pain Assessment ?Pain Assessment: No/denies pain  ? ? ?Home Living Family/patient expects to be discharged to:: Private residence ?Living Arrangements:  Alone ?Available Help at Discharge: Family;Available PRN/intermittently ?Type of Home: House ?Home Access: Stairs to enter ?Entrance Stairs-Rails: None (in the garage) ?Entrance Stairs-Number of Steps: 3-4 at the front, 4 through  the garage (typical entrance) ?  ?Home Layout: One level ?Home Equipment: Shower seat;Cane - single point;Rolling Walker (2 wheels);Hand held shower head ?   ?  ?Prior Function Prior Level of Function : Independent/Modified Independent;Driving ?  ?  ?  ?  ?  ?  ?Mobility Comments: using cane since ankle issues; prior did not use ?ADLs Comments: sits to shower; drives; grocery shopping ?  ? ? ?Hand Dominance  ? Dominant Hand: Right ? ?  ?Extremity/Trunk Assessment  ? Upper Extremity Assessment ?Upper Extremity Assessment: Defer to OT evaluation ?  ? ?Lower Extremity Assessment ?Lower Extremity Assessment: Generalized weakness ?  ? ?Cervical / Trunk Assessment ?Cervical / Trunk Assessment: Normal  ?Communication  ? Communication: HOH  ?Cognition Arousal/Alertness: Awake/alert ?Behavior During Therapy: [facility] for tasks assessed/performed ?Overall Cognitive Status: Within Functional Limits for tasks assessed ?  ?  ?  ?  ?  ?  ?  ?  ?  ?  ?  ?  ?  ?  ?  ?  ?  ?  ?  ? ?  ?General Comments General comments (skin integrity, edema, etc.): After ambulating, pt became very nauseated and began to dry heave repeatedly. RN called and it to give IV anti-emetic. ? ?  ?Exercises    ? ?Assessment/Plan  ?  ?PT Assessment Patient needs continued PT services  ?PT Problem List Decreased strength;Decreased activity tolerance;Decreased balance;Decreased mobility;Decreased knowledge of use of DME ? ?   ?  ?PT Treatment Interventions DME instruction;Gait training;Stair training;Functional mobility training;Therapeutic activities;Therapeutic exercise;Patient/family education   ? ?PT Goals (Current goals can be found in the Care Plan section)  ?Acute Rehab PT Goals ?Patient Stated Goal: return home feeling better ?PT Goal Formulation: With patient ?Time For Goal Achievement: 06/09/21 ?Potential to Achieve Goals: Good ? ?  ?Frequency Min 3X/week ?  ? ? ?Co-evaluation   ?  ?  ?  ?  ? ? ?  ?AM-PAC PT "6 Clicks" Mobility  ?Outcome Measure Help needed  turning from your back to your side while in a flat bed without using bedrails?: None ?Help needed moving from lying on your back to sitting on the side of a flat bed without using bedrails?: None ?Help needed moving to and from a bed to a chair (including a wheelchair)?: A Little ?Help needed standing up from a chair using your arms (e.g., wheelchair or bedside chair)?: A Little ?Help needed to walk in hospital room?: A Little ?Help needed climbing 3-5 steps with a railing? : A Little ?6 Click Score: 20 ? ?  ?End of Session Equipment Utilized During Treatment: Gait belt ?Activity Tolerance: Treatment limited secondary to medical complications (Comment) (nausea; dry heaves) ?Patient left: in bed;with call bell/phone within reach;with bed alarm set;with nursing/sitter in room ?Nurse Communication: Mobility status;Other (comment) (need for anti-nausea meds) ?PT Visit Diagnosis: Unsteadiness on feet (R26.81);Muscle weakness (generalized) (M62.81) ?  ? ?Time: [phone] ?PT Time Calculation (min) (ACUTE ONLY): 32 min ? ? ?Charges:   PT Evaluation ?$PT Eval Low Complexity: 1 Low ?PT Treatments ?$Gait Training: 8-22 mins ?  ?   ? ? ? ?Arby Barrette, PT ?Acute Rehabilitation Services  ?Pager [phone] ?Office [phone] ? ? ?Jeanie Cooks Jazmyn Offner ?05/26/2021, 8:41 AM ? ?

## 2021-05-26 NOTE — Transfer of Care (Signed)
Immediate Anesthesia Transfer of Care Note ? ?Patient: Tammy Good ? ?Procedure(s) Performed: ESOPHAGOGASTRODUODENOSCOPY (EGD) WITH PROPOFOL ? ?Patient Location: PACU ? ?Anesthesia Type:MAC ? ?Level of Consciousness: awake and alert  ? ?Airway & Oxygen Therapy: Patient Spontanous Breathing and Patient connected to nasal cannula oxygen ? ?Post-op Assessment: Report given to RN and Post -op Vital signs reviewed and stable ? ?Post vital signs: Reviewed and stable ? ?Last Vitals:  ?Vitals Value Taken Time  ?BP 108/47 05/26/21 1054  ?Temp    ?Pulse 78   ?Resp 19 05/26/21 1054  ?SpO2 99%   ?Vitals shown include unvalidated device data. ? ?Last Pain:  ?Vitals:  ? 05/26/21 1019  ?TempSrc: Temporal  ?PainSc: 0-No pain  ?   ? ?  ? ?Complications: No notable events documented. ?

## 2021-05-27 ENCOUNTER — Inpatient Hospital Stay (HOSPITAL_COMMUNITY): Payer: Medicare Other

## 2021-05-27 ENCOUNTER — Encounter (HOSPITAL_COMMUNITY): Payer: Self-pay | Admitting: Gastroenterology

## 2021-05-27 ENCOUNTER — Ambulatory Visit: Payer: Medicare Other | Admitting: Podiatry

## 2021-05-27 DIAGNOSIS — R531 Weakness: Secondary | ICD-10-CM | POA: Diagnosis not present

## 2021-05-27 LAB — CBC WITH DIFFERENTIAL/PLATELET
Abs Immature Granulocytes: 1.5 10*3/uL — ABNORMAL HIGH (ref 0.00–0.07)
Band Neutrophils: 4 %
Basophils Absolute: 0 10*3/uL (ref 0.0–0.1)
Basophils Relative: 0 %
Blasts: 1 %
Eosinophils Absolute: 0.4 10*3/uL (ref 0.0–0.5)
Eosinophils Relative: 2 %
HCT: 25.6 % — ABNORMAL LOW (ref 36.0–46.0)
Hemoglobin: 8.2 g/dL — ABNORMAL LOW (ref 12.0–15.0)
Lymphocytes Relative: 7 %
Lymphs Abs: 1.3 10*3/uL (ref 0.7–4.0)
MCH: 28.5 pg (ref 26.0–34.0)
MCHC: 32 g/dL (ref 30.0–36.0)
MCV: 88.9 fL (ref 80.0–100.0)
Metamyelocytes Relative: 5 %
Monocytes Absolute: 0.6 10*3/uL (ref 0.1–1.0)
Monocytes Relative: 3 %
Myelocytes: 3 %
Neutro Abs: 15 10*3/uL — ABNORMAL HIGH (ref 1.7–7.7)
Neutrophils Relative %: 75 %
Platelets: 233 10*3/uL (ref 150–400)
RBC: 2.88 MIL/uL — ABNORMAL LOW (ref 3.87–5.11)
RDW: 17.2 % — ABNORMAL HIGH (ref 11.5–15.5)
WBC: 19 10*3/uL — ABNORMAL HIGH (ref 4.0–10.5)
nRBC: 0 /100 WBC
nRBC: 2.7 % — ABNORMAL HIGH (ref 0.0–0.2)

## 2021-05-27 LAB — GLUCOSE, CAPILLARY
Glucose-Capillary: 110 mg/dL — ABNORMAL HIGH (ref 70–99)
Glucose-Capillary: 152 mg/dL — ABNORMAL HIGH (ref 70–99)
Glucose-Capillary: 112 mg/dL — ABNORMAL HIGH (ref 70–99)

## 2021-05-27 MED ORDER — IOHEXOL 9 MG/ML PO SOLN
ORAL | Status: AC
  Filled 2021-05-27: qty 1000

## 2021-05-27 MED ORDER — PANTOPRAZOLE SODIUM 40 MG PO TBEC
40.0000 mg | DELAYED_RELEASE_TABLET | Freq: Two times a day (BID) | ORAL | Status: DC
  Administered 2021-05-27 – 2021-06-03 (×13): 40 mg via ORAL
  Filled 2021-05-27 (×13): qty 1

## 2021-05-27 MED ORDER — SODIUM CHLORIDE 0.9 % IV SOLN
INTRAVENOUS | Status: DC
Start: 2021-05-27 — End: 2021-05-31

## 2021-05-27 MED ORDER — IOHEXOL 350 MG/ML SOLN
100.0000 mL | Freq: Once | INTRAVENOUS | Status: AC | PRN
  Administered 2021-05-27: 100 mL via INTRAVENOUS

## 2021-05-27 NOTE — Progress Notes (Signed)
?  Transition of Care (TOC) Screening Note ? ? ?Patient Details  ?Name: Tammy Good ?Date of Birth: Aug 28, 1950 ? ? ?Transition of Care (TOC) CM/SW Contact:    ?Cyndi Bender, RN ?Phone Number: ?05/27/2021, 8:53 AM ? ? ? ?Transition of Care Department [facility]) has reviewed patient and no TOC needs have been identified at this time. We will continue to monitor patient advancement through interdisciplinary progression rounds. If new patient transition needs arise, please place a TOC consult. ? ? ?

## 2021-05-27 NOTE — Care Management Important Message (Signed)
Important Message ? ?Patient Details  ?Name: Tammy Good ?MRN: [id number] ?Date of Birth: [birth year] ? ? ?Medicare Important Message Given:  Yes ? ? ? ? ?Aslan Himes ?05/27/2021, 3:05 PM ?

## 2021-05-27 NOTE — Progress Notes (Signed)
PROGRESS NOTE    Tammy Good  EVO:[id number] DOB: 1950/04/13 DOA: 05/24/2021 PCP: [name], MD    Brief Narrative:  71 year old female with history of left ankle infection, MRSA bacteremia and tenosynovitis of the left ankle status post I&D, essential hypertension, hyperlipidemia, type 2 diabetes, chronic iron deficiency anemia with baseline hemoglobin about 8, hypothyroidism, paroxysmal A-fib on Eliquis, GERD, coronary artery disease status post PCI with stent presented from home with gradually worsening generalized weakness for 2 days, poor appetite and ongoing chronic nausea.  At the emergency room blood pressure stable.  Hemoglobin 5.6, creatinine 1.89 from baseline 0.78.   Assessment & Plan:  Symptomatic anemia, acute blood loss anemia with history of chronic iron deficiency anemia: In the context of taking anticoagulation with Eliquis and antiplatelet therapy with Plavix. Currently hemodynamically stable. Presentation hemoglobin 5.6-2 units of PRBC transfused-posttransfusion hemoglobin more than 8.  Appropriately responded.  Upper GI endoscopy 3/5 with esophageal ulcer, no active bleeding.  Patient was on IV Protonix.  Will change to Protonix 40 mg twice daily.  Patient complains of persistent nausea,.  Resume IV fluids.  We will check CT scan abdomen pelvis to rule out any intra-abdominal pathology.  Acute kidney injury likely prerenal injury in the setting of dehydration and poor oral intake: #1.  IV fluids and monitoring.  Normalized.  Recheck tomorrow morning.  Type 2 diabetes with hyperglycemia: Currently fairly stable.  On sliding scale insulin.  Essential hypertension: Blood pressure is improving.  Continue to hold antihypertensives today.  Will resume tomorrow.  Hypothyroidism: On home dose of Synthroid.  Paroxysmal A-fib: Rate controlled in sinus rhythm.  Eliquis on hold.  We will continue to hold until outpatient follow-up.  Left ankle infection: Completed  antibiotic therapy.  Local wound care.  Severe peripheral vascular disease/history of coronary artery disease: No recent history of coronary stenting. Patient has severe peripheral vascular disease, she had aortic stent placed within 1 year.  Remains on Plavix. It will be challenging to continue both Plavix and Eliquis on this patient, if hemoglobin remains stable, will resume Plavix, however continue to hold Eliquis until follow-up hemoglobin and stabilization.   DVT prophylaxis: SCDs Start: 05/24/21 2214   Code Status: Full code Family Communication: None Disposition Plan: Status is: Inpatient Remains inpatient appropriate because: Persistent nausea.            Consultants:  Gastroenterology  Procedures:  Upper GI endoscopy.  Antimicrobials:  None   Subjective:  Patient seen and examined.  Complained of persistent nausea.  Denies any vomiting.  Denies any abdominal pain or bloating.  Last bowel movement 2 days ago.  Passing flatus.  Objective: Vitals:   05/26/21 2354 05/27/21 0402 05/27/21 0748 05/27/21 1143  BP: (!) 117/48 108/62 (!) 121/58 (!) 127/53  Pulse: 83  89 97  Resp: '20 18 17 17  '$ Temp: 98.2 F (36.8 C) 98.1 F (36.7 C) 98.5 F (36.9 C) 98.3 F (36.8 C)  TempSrc: Oral Oral Oral Oral  SpO2: 96% 97% 96% 94%  Weight:      Height:        Intake/Output Summary (Last 24 hours) at 05/27/2021 1302 Last data filed at 05/27/2021 0600 Gross per 24 hour  Intake 1162.28 ml  Output 900 ml  Net 262.28 ml   Filed Weights   05/24/21 1713 05/25/21 1330  Weight: 99.8 kg 96.5 kg    Examination:  General: Fairly comfortable at rest.  Slightly anxious.  Not in any distress. Cardiovascular: S1-S2 normal.  Regular  rate rhythm. Respiratory: Bilateral clear. Gastrointestinal: Obese.  Pendulous.  Nontender. Ext: No deformities.  Left ankle with dressing on. Neuro: Intact.      Data Reviewed: I have personally reviewed following labs and imaging  studies  CBC: Recent Labs  Lab 05/24/21 1405 05/24/21 1939 05/25/21 1447 05/26/21 0429 05/27/21 0758  WBC 9.3 11.4*  --  14.3* 19.0*  NEUTROABS 6,352 8.0*  --  11.4* 15.0*  HGB 5.7* 5.6* 8.5* 8.5* 8.2*  HCT 19.2* 19.0* 25.9* 25.5* 25.6*  MCV 86.9 89.6  --  86.7 88.9  PLT 313 300  --  230 850   Basic Metabolic Panel: Recent Labs  Lab 05/24/21 1405 05/24/21 1939 05/26/21 0146  NA 134* 132* 139  K 4.1 4.2 4.0  CL 99 100 106  CO2 20 20* 24  GLUCOSE 206* 167* 79  BUN 26* 28* 11  CREATININE 1.12* 1.89* 0.90  CALCIUM 8.5* 8.5* 8.1*  MG  --   --  2.2  PHOS  --   --  2.3*   GFR: Estimated Creatinine Clearance: 71.4 mL/min (by C-G formula based on SCr of 0.9 mg/dL). Liver Function Tests: Recent Labs  Lab 05/24/21 1405 05/24/21 1939 05/26/21 0146  AST 8* 15 18  ALT '7 13 12  '$ ALKPHOS  --  80 72  BILITOT 0.5 0.5 1.1  PROT 6.5 6.7 5.4*  ALBUMIN  --  3.1* 2.5*   No results for input(s): LIPASE, AMYLASE in the last 168 hours. No results for input(s): AMMONIA in the last 168 hours. Coagulation Profile: No results for input(s): INR, PROTIME in the last 168 hours. Cardiac Enzymes: No results for input(s): CKTOTAL, CKMB, CKMBINDEX, TROPONINI in the last 168 hours. BNP (last 3 results) No results for input(s): PROBNP in the last 8760 hours. HbA1C: No results for input(s): HGBA1C in the last 72 hours. CBG: Recent Labs  Lab 05/26/21 1100 05/26/21 1611 05/26/21 2003 05/27/21 0751 05/27/21 1147  GLUCAP 76 125* 125* 110* 152*   Lipid Profile: No results for input(s): CHOL, HDL, LDLCALC, TRIG, CHOLHDL, LDLDIRECT in the last 72 hours. Thyroid Function Tests: No results for input(s): TSH, T4TOTAL, FREET4, T3FREE, THYROIDAB in the last 72 hours. Anemia Panel: No results for input(s): VITAMINB12, FOLATE, FERRITIN, TIBC, IRON, RETICCTPCT in the last 72 hours. Sepsis Labs: No results for input(s): PROCALCITON, LATICACIDVEN in the last 168 hours.  Recent Results (from the  past 240 hour(s))  Resp Panel by RT-PCR (Flu A&B, Covid) Nasopharyngeal Swab     Status: None   Collection Time: 05/24/21  5:12 PM   Specimen: Nasopharyngeal Swab; Nasopharyngeal(NP) swabs in vial transport medium  Result Value Ref Range Status   SARS Coronavirus 2 by RT PCR NEGATIVE NEGATIVE Final    Comment: (NOTE) SARS-CoV-2 target nucleic acids are NOT DETECTED.  The SARS-CoV-2 RNA is generally detectable in upper respiratory specimens during the acute phase of infection. The lowest concentration of SARS-CoV-2 viral copies this assay can detect is 138 copies/mL. A negative result does not preclude SARS-Cov-2 infection and should not be used as the sole basis for treatment or other patient management decisions. A negative result may occur with  improper specimen collection/handling, submission of specimen other than nasopharyngeal swab, presence of viral mutation(s) within the areas targeted by this assay, and inadequate number of viral copies(<138 copies/mL). A negative result must be combined with clinical observations, patient history, and epidemiological information. The expected result is Negative.  Fact Sheet for Patients:  EntrepreneurPulse.com.au  Fact Sheet for Healthcare  Providers:  IncredibleEmployment.be  This test is no t yet approved or cleared by the [place] and  has been authorized for detection and/or diagnosis of SARS-CoV-2 by FDA under an Emergency Use Authorization (EUA). This EUA will remain  in effect (meaning this test can be used) for the duration of the COVID-19 declaration under Section 564(b)(1) of the Act, 21 U.S.C.section 360bbb-3(b)(1), unless the authorization is terminated  or revoked sooner.       Influenza A by PCR NEGATIVE NEGATIVE Final   Influenza B by PCR NEGATIVE NEGATIVE Final    Comment: (NOTE) The Xpert Xpress SARS-CoV-2/FLU/RSV plus assay is intended as an aid in the diagnosis of  influenza from Nasopharyngeal swab specimens and should not be used as a sole basis for treatment. Nasal washings and aspirates are unacceptable for Xpert Xpress SARS-CoV-2/FLU/RSV testing.  Fact Sheet for Patients: EntrepreneurPulse.com.au  Fact Sheet for Healthcare Providers: IncredibleEmployment.be  This test is not yet approved or cleared by the [place] FDA and has been authorized for detection and/or diagnosis of SARS-CoV-2 by FDA under an Emergency Use Authorization (EUA). This EUA will remain in effect (meaning this test can be used) for the duration of the COVID-19 declaration under Section 564(b)(1) of the Act, 21 U.S.C. section 360bbb-3(b)(1), unless the authorization is terminated or revoked.  Performed at [facility], [place] [address] [place], [place] [address]          Radiology Studies: No results found.      Scheduled Meds:  atorvastatin  40 mg Oral QPM   insulin aspart  0-5 Units Subcutaneous QHS   insulin aspart  0-9 Units Subcutaneous TID WC   levothyroxine  125 mcg Oral QAC breakfast   pantoprazole (PROTONIX) IV  40 mg Intravenous Q12H   Continuous Infusions:  sodium chloride     sodium chloride 75 mL/hr at 05/27/21 1247     LOS: 3 days    Time spent: 35 minutes    Barb Merino, MD [facility] Pager [phone]

## 2021-05-27 NOTE — Progress Notes (Signed)
Physical Therapy Treatment ?Patient Details ?Name: Tammy Good ?MRN: [id number] ?DOB: 1951/03/02 ?Today's Date: 05/27/2021 ? ? ?History of Present Illness 71 y.o. female who presented from home via EMS 05/24/21 with complaints of gradually worsening generalized weakness and nausea with poor intake. Hgb 5.6 (8.5 after 2 units of PRBC), acute renal insufficiency, hypovolemic, V/Q scan negative for PE   PMH significant for left ankle infection, MRSA bacteremia (04/22/21) tenosynovitis of left ankle status post I&D, hypertension, hyperlipidemia, type 2 diabetes, anemia, paroxysmal A-fib on Eliquis, coronary artery disease status post PCI with stent, obesity ? ?  ?PT Comments  ? ? Pt admitted with above diagnosis. Pt was unable to ambulate due to nausea and vomiting as well as decr BP.  STanding BP 108/59 with dizziness and once sitting 138/69. Limited treatment and notified nursing due to pt could not ambulate due to above.  Pt currently with functional limitations due to balance and endurance deficits. Pt will benefit from skilled PT to increase their independence and safety with mobility to allow discharge to the venue listed below.      ?Recommendations for follow up therapy are one component of a multi-disciplinary discharge planning process, led by the attending physician.  Recommendations may be updated based on patient status, additional functional criteria and insurance authorization. ? ?Follow Up Recommendations ? No PT follow up ?  ?  ?Assistance Recommended at Discharge PRN  ?Patient can return home with the following Assistance with cooking/housework;Assist for transportation;Help with stairs or ramp for entrance ?  ?Equipment Recommendations ? None recommended by PT  ?  ?Recommendations for Other Services OT consult ? ? ?  ?Precautions / Restrictions Precautions ?Precautions: Fall ?Precaution Comments: reports one fall ?Restrictions ?Weight Bearing Restrictions: No  ?  ? ?Mobility ? Bed Mobility ?Overal bed  mobility: Modified Independent ?  ?  ?  ?  ?  ?  ?General bed mobility comments: HOB flat, no rail ?  ? ?Transfers ?Overall transfer level: Needs assistance ?Equipment used: 1 person hand held assist ?Transfers: Sit to/from Stand ?Sit to Stand: Min guard ?  ?  ?  ?  ?  ?General transfer comment: pt reaching for assist due to feeling weak.  noted that pad soaked with urine as purewick leaked therefore cleaned pt with total assist and changed pads. Pt reports she was feeling dizzy therefore had her sit down and checked her BP which  was 108/59 in standing and then 138/69 once sitting.  Appears pt may be orthostatic.  Pt also vomited a small amount. Notified nursing and had pt lay back down. ?  ? ?Ambulation/Gait ?  ?  ?  ?  ?  ?  ?  ?  ? ? ?Stairs ?  ?  ?  ?  ?  ? ? ?Wheelchair Mobility ?  ? ?Modified Rankin (Stroke Patients Only) ?  ? ? ?  ?Balance Overall balance assessment: Needs assistance ?Sitting-balance support: No upper extremity supported, Feet supported ?Sitting balance-Leahy Scale: Good ?  ?  ?Standing balance support: Single extremity supported, During functional activity ?Standing balance-Leahy Scale: Poor ?Standing balance comment: seeking UE support in static standing; bil UE support for dynamic balance ?  ?  ?  ?  ?  ?  ?  ?  ?  ?  ?  ?  ? ?  ?Cognition Arousal/Alertness: Awake/alert ?Behavior During Therapy: [facility] for tasks assessed/performed ?Overall Cognitive Status: Within Functional Limits for tasks assessed ?  ?  ?  ?  ?  ?  ?  ?  ?  ?  ?  ?  ?  ?  ?  ?  ?  ?  ?  ? ?  ?  Exercises   ? ?  ?General Comments General comments (skin integrity, edema, etc.): O2 >88% on RA with activity - see BPS above. ?  ?  ? ?Pertinent Vitals/Pain Pain Assessment ?Pain Assessment: No/denies pain  ? ? ?Home Living   ?  ?  ?  ?  ?  ?  ?  ?  ?  ?   ?  ?Prior Function    ?  ?  ?   ? ?PT Goals (current goals can now be found in the care plan section) Progress towards PT goals: Not progressing toward goals - comment (BP issues  and nausea/vomiting) ? ?  ?Frequency ? ? ? Min 3X/week ? ? ? ?  ?PT Plan Current plan remains appropriate  ? ? ?Co-evaluation   ?  ?  ?  ?  ? ?  ?AM-PAC PT "6 Clicks" Mobility   ?Outcome Measure ? Help needed turning from your back to your side while in a flat bed without using bedrails?: None ?Help needed moving from lying on your back to sitting on the side of a flat bed without using bedrails?: None ?Help needed moving to and from a bed to a chair (including a wheelchair)?: A Little ?Help needed standing up from a chair using your arms (e.g., wheelchair or bedside chair)?: A Little ?Help needed to walk in hospital room?: A Little ?Help needed climbing 3-5 steps with a railing? : A Little ?6 Click Score: 20 ? ?  ?End of Session Equipment Utilized During Treatment: Gait belt ?Activity Tolerance: Treatment limited secondary to medical complications (Comment) (nausea; vomiting, decr BP) ?Patient left: in bed;with call bell/phone within reach;with bed alarm set ?Nurse Communication: Mobility status;Other (comment) (need for anti-nausea meds, BP drop) ?PT Visit Diagnosis: Unsteadiness on feet (R26.81);Muscle weakness (generalized) (M62.81) ?  ? ? ?Time: 3614-4315 ?PT Time Calculation (min) (ACUTE ONLY): 18 min ? ?Charges:  $Therapeutic Activity: 8-22 mins          ?          ? ?Tammy Good M,PT ?Acute Rehab Services ?[phone] ?[phone] (pager)  ? ? ?Tammy Good ?05/27/2021, 4:10 PM ? ?

## 2021-05-27 NOTE — Plan of Care (Signed)
Patient having intermittent periods of nausea relieved with Zofran.  ?Problem: Health Behavior/Discharge Planning: ?Goal: Ability to manage health-related needs will improve ?Outcome: Progressing ?  ?Problem: Education: ?Goal: Knowledge of General Education information will improve ?Description: Including pain rating scale, medication(s)/side effects and non-pharmacologic comfort measures ?Outcome: Progressing ?  ?Problem: Clinical Measurements: ?Goal: Ability to maintain clinical measurements within normal limits will improve ?Outcome: Progressing ?Goal: Will remain free from infection ?Outcome: Progressing ?Goal: Diagnostic test results will improve ?Outcome: Progressing ?Goal: Respiratory complications will improve ?Outcome: Progressing ?Goal: Cardiovascular complication will be avoided ?Outcome: Progressing ?  ?Problem: Activity: ?Goal: Risk for activity intolerance will decrease ?Outcome: Progressing ?  ?Problem: Nutrition: ?Goal: Adequate nutrition will be maintained ?Outcome: Progressing ?  ?

## 2021-05-27 NOTE — TOC Initial Note (Signed)
Transition of Care (TOC) - Initial/Assessment Note  ? ? ?Patient Details  ?Name: Tammy Good ?MRN: [id number] ?Date of Birth: [birth year] ? ?Transition of Care (TOC) CM/SW Contact:    ?Cyndi Bender, RN ?Phone Number: ?05/27/2021, 12:42 PM ? ?Clinical Narrative:        ?Spoke with patient regarding transition needs. ?Patient states she is active with [facility] for RN. The RN comes once a week. Patient's sister and daughter change her dressing  the other days. Her family can take her to appointments one MWF. The other days she was taking [facility] transport. ?Patent will need resumption Home Health RN orders       ?Toc will continue to follow ? ?Expected Discharge Plan: [place] ?Barriers to Discharge: Continued Medical Work up ? ? ?Patient Goals and CMS Choice ?Patient states their goals for this hospitalization and ongoing recovery are:: return home ?CMS Medicare.gov Compare Post Acute Care list provided to:: Patient ?Choice offered to / list presented to : Patient ? ?Expected Discharge Plan and Services ?Expected Discharge Plan: [place] ?  ?Discharge Planning Services: CM Consult ?  ?Living arrangements for the past 2 months: [place] ?                ?  ?  ?  ?  ?  ?  ?  ?  ?  ?  ? ?Prior Living Arrangements/Services ?Living arrangements for the past 2 months: [place] ?  ?Patient language and need for interpreter reviewed:: Yes ?Do you feel safe going back to the place where you live?: Yes      ?  ?Care giver support system in place?: Yes (comment) ?  ?Criminal Activity/Legal Involvement Pertinent to Current Situation/Hospitalization: No - Comment as needed ? ?Activities of Daily Living ?  ?  ? ?Permission Sought/Granted ?  ?  ?   ?   ?   ?   ? ?Emotional Assessment ?Appearance:: Appears stated age ?Attitude/Demeanor/Rapport: Engaged ?Affect (typically observed): Accepting ?Orientation: : Oriented to Self, Oriented to Place, Oriented to  Time, Oriented  to Situation ?Alcohol / Substance Use: Not Applicable ?Psych Involvement: No (comment) ? ?Admission diagnosis:  SOB (shortness of breath) [R06.02] ?Hypoxia [R09.02] ?Generalized weakness [R53.1] ?AKI (acute kidney injury) ([place]) [N17.9] ?Symptomatic anemia [D64.9] ?Patient Active Problem List  ? Diagnosis Date Noted  ? Vertigo 05/24/2021  ? Generalized weakness 05/24/2021  ? Protein-calorie malnutrition, severe 04/26/2021  ? Tenosynovitis of left ankle 04/25/2021  ? Sepsis ([place]) 04/24/2021  ? Cellulitis 04/23/2021  ? MRSA bacteremia 04/23/2021  ? Thrombocytosis 04/23/2021  ? Fatty liver 06/19/2020  ? 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 Atherosclerotic heart disease of native coronary artery without angina pectoris 03/20/2020  ? Epigastric pain 03/20/2020  ? Nausea 03/20/2020  ? Absence of toe ([place]) 06/28/2019  ? 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 PAD (peripheral artery disease) ([place]) 05/26/2019  ? Osteomyelitis of left foot ([place]) 05/24/2019  ? Osteomyelitis ([place]) 05/23/2019  ? Cellulitis and abscess of foot, except toes 05/18/2019  ? Chronic osteomyelitis of ankle and foot ([place]) 05/18/2019  ? Cholesteatoma 02/24/2019  ? Cellulitis and abscess of toe 11/09/2018  ? Colon cancer screening 11/30/2017  ? Long term (current) use of insulin ([place]) 03/05/2017  ? Iron deficiency anemia 12/10/2014  ? Obesity (BMI 30-39.9) 11/17/2013  ? Diabetes mellitus type 2 in obese [facility])   ? Essential hypertension   ?  Right thigh pain 11/01/2013  ? PVC's (premature ventricular contractions) 11/01/2013  ? Status post insertion of drug eluting coronary artery stent to [facility] emergently and PTCA to prox. PLA 09/16/2013  ? Hyperlipidemia associated with type 2 diabetes mellitus ([place]) 09/16/2013  ? Presence of coronary angioplasty implant and graft 09/08/2013  ? 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 Non-ST elevation (NSTEMI) myocardial infarction ([place]) 08/22/2013  ? CAD S/P percutaneous  coronary angioplasty 08/22/2013  ? ?PCP:  [name], MD ?Pharmacy:   ?[place] ([place]), [place] - [address] ?[address] ?[place] [address] ?Phone: [phone] Fax: [phone] ? ?[place] ?[place] [address] ?Phone: [phone] Fax: [phone] ? ? ? ? ?Social Determinants of Health (SDOH) Interventions ?  ? ?Readmission Risk Interventions ?Readmission Risk Prevention Plan 05/30/2019 05/27/2019  ?Transportation Screening Complete Complete  ?PCP or Specialist Appt within 5-7 Days Complete Complete  ?Home Care Screening Complete Complete  ?Medication Review (RN CM) Complete Complete  ?Some recent data might be hidden  ? ? ? ?

## 2021-05-28 ENCOUNTER — Ambulatory Visit: Payer: Medicare Other | Admitting: Gastroenterology

## 2021-05-28 ENCOUNTER — Inpatient Hospital Stay (HOSPITAL_COMMUNITY): Payer: Medicare Other

## 2021-05-28 DIAGNOSIS — R531 Weakness: Secondary | ICD-10-CM | POA: Diagnosis not present

## 2021-05-28 LAB — CBC WITH DIFFERENTIAL/PLATELET
Abs Immature Granulocytes: 0.8 10*3/uL — ABNORMAL HIGH (ref 0.00–0.07)
Basophils Absolute: 0 10*3/uL (ref 0.0–0.1)
Basophils Relative: 0 %
Eosinophils Absolute: 0.5 10*3/uL (ref 0.0–0.5)
Eosinophils Relative: 3 %
HCT: 25.4 % — ABNORMAL LOW (ref 36.0–46.0)
Hemoglobin: 7.9 g/dL — ABNORMAL LOW (ref 12.0–15.0)
Lymphocytes Relative: 10 %
Lymphs Abs: 1.7 10*3/uL (ref 0.7–4.0)
MCH: 28 pg (ref 26.0–34.0)
MCHC: 31.1 g/dL (ref 30.0–36.0)
MCV: 90.1 fL (ref 80.0–100.0)
Metamyelocytes Relative: 5 %
Monocytes Absolute: 0.7 10*3/uL (ref 0.1–1.0)
Monocytes Relative: 4 %
Neutro Abs: 13.2 10*3/uL — ABNORMAL HIGH (ref 1.7–7.7)
Neutrophils Relative %: 78 %
Platelets: 231 10*3/uL (ref 150–400)
RBC: 2.82 MIL/uL — ABNORMAL LOW (ref 3.87–5.11)
RDW: 17.5 % — ABNORMAL HIGH (ref 11.5–15.5)
WBC: 16.9 10*3/uL — ABNORMAL HIGH (ref 4.0–10.5)
nRBC: 3 % — ABNORMAL HIGH (ref 0.0–0.2)
nRBC: 4 /100 WBC — ABNORMAL HIGH

## 2021-05-28 LAB — BASIC METABOLIC PANEL
Anion gap: 7 (ref 5–15)
BUN: <5 mg/dL — ABNORMAL LOW (ref 8–23)
CO2: 25 mmol/L (ref 22–32)
Calcium: 7.8 mg/dL — ABNORMAL LOW (ref 8.9–10.3)
Chloride: 104 mmol/L (ref 98–111)
Creatinine, Ser: 0.84 mg/dL (ref 0.44–1.00)
GFR, Estimated: >60 mL/min (ref >60)
Glucose, Bld: 128 mg/dL — ABNORMAL HIGH (ref 70–99)
Potassium: 3.8 mmol/L (ref 3.5–5.1)
Sodium: 136 mmol/L (ref 135–145)

## 2021-05-28 LAB — GLUCOSE, CAPILLARY
Glucose-Capillary: 119 mg/dL — ABNORMAL HIGH (ref 70–99)
Glucose-Capillary: 115 mg/dL — ABNORMAL HIGH (ref 70–99)
Glucose-Capillary: 120 mg/dL — ABNORMAL HIGH (ref 70–99)
Glucose-Capillary: 126 mg/dL — ABNORMAL HIGH (ref 70–99)
Glucose-Capillary: 157 mg/dL — ABNORMAL HIGH (ref 70–99)

## 2021-05-28 LAB — MAGNESIUM: Magnesium: 1.7 mg/dL (ref 1.7–2.4)

## 2021-05-28 MED ORDER — POLYETHYLENE GLYCOL 3350 17 G PO PACK
17.0000 g | PACK | Freq: Every day | ORAL | Status: DC
  Administered 2021-05-29 – 2021-06-03 (×4): 17 g via ORAL
  Filled 2021-05-28 (×4): qty 1

## 2021-05-28 MED ORDER — SODIUM CHLORIDE 0.9 % IV SOLN
12.5000 mg | Freq: Four times a day (QID) | INTRAVENOUS | Status: DC | PRN
  Administered 2021-05-29: 12.5 mg via INTRAVENOUS
  Filled 2021-05-28 (×3): qty 0.5

## 2021-05-28 NOTE — Progress Notes (Signed)
PT Cancellation Note ? ?Patient Details ?Name: Tammy Good ?MRN: [id number] ?DOB: 09-Jan-1951 ? ? ?Cancelled Treatment:    Reason Eval/Treat Not Completed: Other (comment) pt politely declining due to continued nausea/vomiting. ? ?Wyona Almas, PT, DPT ?Acute Rehabilitation Services ?Pager [phone] ?Office [phone] ? ? ? ?Deno Etienne ?05/28/2021, 1:39 PM ?

## 2021-05-28 NOTE — Progress Notes (Signed)
PROGRESS NOTE    Tammy Good  PJA:[id number] DOB: May 13, 1950 DOA: 05/24/2021 PCP: [name], MD    Brief Narrative:  71 year old female with history of left ankle infection, MRSA bacteremia and tenosynovitis of the left ankle status post I&D, essential hypertension, hyperlipidemia, type 2 diabetes, chronic iron deficiency anemia with baseline hemoglobin about 8, hypothyroidism, paroxysmal A-fib on Eliquis, GERD, coronary artery disease status post PCI with stent presented from home with gradually worsening generalized weakness for 2 days, poor appetite and ongoing chronic nausea.  At the emergency room blood pressure stable.  Hemoglobin 5.6, creatinine 1.89 from baseline 0.78.   Assessment & Plan:  Symptomatic anemia, acute blood loss anemia with history of chronic iron deficiency anemia: In the context of taking anticoagulation with Eliquis and antiplatelet therapy with Plavix. Currently hemodynamically stable. Presentation hemoglobin 5.6-2 units of PRBC transfused-posttransfusion hemoglobin more than 8.  Appropriately responded.  Upper GI endoscopy 3/5 with esophageal ulcer, no active bleeding.  Patient was on IV Protonix.  Changed to Protonix 40 mg twice daily.  Patient complains of persistent nausea,.  Resumed IV fluids.  CT scan of the abdomen pelvis with contrast was done, no evidence of any intestinal abnormalities, however it shows hyperdense lesion in the liver , MRI abdomen today.   Acute kidney injury likely prerenal injury in the setting of dehydration and poor oral intake: #1.  IV fluids and monitoring.  Normalized.    Type 2 diabetes with hyperglycemia: Currently fairly stable.  On sliding scale insulin.  Essential hypertension: Blood pressure is improving.  Continue to hold antihypertensives today.  Will resume when she has good appetite.   Hypothyroidism: On home dose of Synthroid.  Euthyroid.  Paroxysmal A-fib: Rate controlled in sinus rhythm.  Eliquis on hold.   We will continue to hold until outpatient follow-up.  Recent MRSA infection treated with 2 weeks of vancomycin, intolerance to Zyvox and not treated.   Patient has developed a leukocytosis.  CT scan of the abdomen pelvis with 2 x 2 centimeter heterogenous mass in the liver.  Left ankle clinically improving.   -Recheck blood cultures today. -MRI of the liver to rule out evidence of metastatic abscess.  If positive, will discuss with ID.  Severe peripheral vascular disease/history of coronary artery disease: No recent history of coronary stenting. Patient has severe peripheral vascular disease, she had aortic stent placed within 1 year.  Remains on Plavix. It will be challenging to continue both Plavix and Eliquis on this patient, if hemoglobin remains stable, will resume Plavix, however continue to hold Eliquis until follow-up hemoglobin and stabilization.   DVT prophylaxis: SCDs Start: 05/24/21 2214   Code Status: Full code Family Communication: None Disposition Plan: Status is: Inpatient Remains inpatient appropriate because: Persistent nausea.  Consultants:  Gastroenterology  Procedures:  Upper GI endoscopy.  Antimicrobials:  None   Subjective:  Patient seen and examined.  She had slight improvement last night, again he started having nausea with slightest mobility today.  Had episodes of vomiting but not continuous. Remains afebrile.  Objective: Vitals:   05/28/21 0022 05/28/21 0429 05/28/21 0740 05/28/21 1153  BP: (!) 102/57 101/69 (!) 121/49 (!) 106/58  Pulse: 99 98 93 98  Resp: '19 19 17 17  '$ Temp: 98 F (36.7 C) 97.9 F (36.6 C) 98.1 F (36.7 C) 98.6 F (37 C)  TempSrc: Oral Oral Oral Oral  SpO2: 100% 95% 98% 96%  Weight:      Height:        Intake/Output  Summary (Last 24 hours) at 05/28/2021 1316 Last data filed at 05/28/2021 0436 Gross per 24 hour  Intake 546.66 ml  Output 2500 ml  Net -1953.34 ml   Filed Weights   05/24/21 1713 05/25/21 1330  Weight:  99.8 kg 96.5 kg    Examination:  General: Fairly comfortable at rest.  Anxious.  Sitting in couch. Cardiovascular: S1-S2 normal.  Regular rate rhythm. Respiratory: Bilateral clear. Gastrointestinal: Obese.  Pendulous.  Nontender. Ext: No deformities.  Left ankle with postsurgical incisions intact, no drainage.  Picture taken. Neuro: Intact.        Data Reviewed: I have personally reviewed following labs and imaging studies  CBC: Recent Labs  Lab 05/24/21 1405 05/24/21 1939 05/25/21 1447 05/26/21 0429 05/27/21 0758 05/28/21 0221  WBC 9.3 11.4*  --  14.3* 19.0* 16.9*  NEUTROABS 6,352 8.0*  --  11.4* 15.0* 13.2*  HGB 5.7* 5.6* 8.5* 8.5* 8.2* 7.9*  HCT 19.2* 19.0* 25.9* 25.5* 25.6* 25.4*  MCV 86.9 89.6  --  86.7 88.9 90.1  PLT 313 300  --  230 233 628   Basic Metabolic Panel: Recent Labs  Lab 05/24/21 1405 05/24/21 1939 05/26/21 0146 05/28/21 0221  NA 134* 132* 139 136  K 4.1 4.2 4.0 3.8  CL 99 100 106 104  CO2 20 20* 24 25  GLUCOSE 206* 167* 79 128*  BUN 26* 28* 11 <5*  CREATININE 1.12* 1.89* 0.90 0.84  CALCIUM 8.5* 8.5* 8.1* 7.8*  MG  --   --  2.2 1.7  PHOS  --   --  2.3*  --    GFR: Estimated Creatinine Clearance: 76.5 mL/min (by C-G formula based on SCr of 0.84 mg/dL). Liver Function Tests: Recent Labs  Lab 05/24/21 1405 05/24/21 1939 05/26/21 0146  AST 8* 15 18  ALT '7 13 12  '$ ALKPHOS  --  80 72  BILITOT 0.5 0.5 1.1  PROT 6.5 6.7 5.4*  ALBUMIN  --  3.1* 2.5*   No results for input(s): LIPASE, AMYLASE in the last 168 hours. No results for input(s): AMMONIA in the last 168 hours. Coagulation Profile: No results for input(s): INR, PROTIME in the last 168 hours. Cardiac Enzymes: No results for input(s): CKTOTAL, CKMB, CKMBINDEX, TROPONINI in the last 168 hours. BNP (last 3 results) No results for input(s): PROBNP in the last 8760 hours. HbA1C: No results for input(s): HGBA1C in the last 72 hours. CBG: Recent Labs  Lab 05/27/21 1147  05/27/21 1551 05/28/21 0203 05/28/21 0744 05/28/21 1155  GLUCAP 152* 112* 119* 115* 120*   Lipid Profile: No results for input(s): CHOL, HDL, LDLCALC, TRIG, CHOLHDL, LDLDIRECT in the last 72 hours. Thyroid Function Tests: No results for input(s): TSH, T4TOTAL, FREET4, T3FREE, THYROIDAB in the last 72 hours. Anemia Panel: No results for input(s): VITAMINB12, FOLATE, FERRITIN, TIBC, IRON, RETICCTPCT in the last 72 hours. Sepsis Labs: No results for input(s): PROCALCITON, LATICACIDVEN in the last 168 hours.  Recent Results (from the past 240 hour(s))  Resp Panel by RT-PCR (Flu A&B, Covid) Nasopharyngeal Swab     Status: None   Collection Time: 05/24/21  5:12 PM   Specimen: Nasopharyngeal Swab; Nasopharyngeal(NP) swabs in vial transport medium  Result Value Ref Range Status   SARS Coronavirus 2 by RT PCR NEGATIVE NEGATIVE Final    Comment: (NOTE) SARS-CoV-2 target nucleic acids are NOT DETECTED.  The SARS-CoV-2 RNA is generally detectable in upper respiratory specimens during the acute phase of infection. The lowest concentration of SARS-CoV-2 viral copies this  assay can detect is 138 copies/mL. A negative result does not preclude SARS-Cov-2 infection and should not be used as the sole basis for treatment or other patient management decisions. A negative result may occur with  improper specimen collection/handling, submission of specimen other than nasopharyngeal swab, presence of viral mutation(s) within the areas targeted by this assay, and inadequate number of viral copies(<138 copies/mL). A negative result must be combined with clinical observations, patient history, and epidemiological information. The expected result is Negative.  Fact Sheet for Patients:  EntrepreneurPulse.com.au  Fact Sheet for Healthcare Providers:  IncredibleEmployment.be  This test is no t yet approved or cleared by the [place] FDA and  has been authorized  for detection and/or diagnosis of SARS-CoV-2 by FDA under an Emergency Use Authorization (EUA). This EUA will remain  in effect (meaning this test can be used) for the duration of the COVID-19 declaration under Section 564(b)(1) of the Act, 21 U.S.C.section 360bbb-3(b)(1), unless the authorization is terminated  or revoked sooner.       Influenza A by PCR NEGATIVE NEGATIVE Final   Influenza B by PCR NEGATIVE NEGATIVE Final    Comment: (NOTE) The Xpert Xpress SARS-CoV-2/FLU/RSV plus assay is intended as an aid in the diagnosis of influenza from Nasopharyngeal swab specimens and should not be used as a sole basis for treatment. Nasal washings and aspirates are unacceptable for Xpert Xpress SARS-CoV-2/FLU/RSV testing.  Fact Sheet for Patients: EntrepreneurPulse.com.au  Fact Sheet for Healthcare Providers: IncredibleEmployment.be  This test is not yet approved or cleared by the [place] FDA and has been authorized for detection and/or diagnosis of SARS-CoV-2 by FDA under an Emergency Use Authorization (EUA). This EUA will remain in effect (meaning this test can be used) for the duration of the COVID-19 declaration under Section 564(b)(1) of the Act, 21 U.S.C. section 360bbb-3(b)(1), unless the authorization is terminated or revoked.  Performed at [facility], [place] [address], [place], [place] [address]   Culture, blood (routine x 2)     Status: None (Preliminary result)   Collection Time: 05/28/21 10:16 AM   Specimen: BLOOD LEFT HAND  Result Value Ref Range Status   Specimen Description BLOOD LEFT HAND  Final   Special Requests   Final    BOTTLES DRAWN AEROBIC AND ANAEROBIC Blood Culture adequate volume   Culture   Final    NO GROWTH <12 HOURS Performed at [facility], [place] [address] [place], [place] [address]    Report Status PENDING  Incomplete  Culture, blood (routine x 2)     Status: None (Preliminary result)    Collection Time: 05/28/21 10:24 AM   Specimen: BLOOD  Result Value Ref Range Status   Specimen Description BLOOD LEFT ANTECUBITAL  Final   Special Requests   Final    BOTTLES DRAWN AEROBIC AND ANAEROBIC Blood Culture adequate volume   Culture   Final    NO GROWTH <12 HOURS Performed at [facility], [place] [address], [place], [place] [address]    Report Status PENDING  Incomplete         Radiology Studies: CT ABDOMEN PELVIS W CONTRAST  Result Date: 05/27/2021 CLINICAL DATA:  Nausea and vomiting, anemia. EXAM: CT ABDOMEN AND PELVIS WITH CONTRAST TECHNIQUE: Multidetector CT imaging of the abdomen and pelvis was performed using the standard protocol following bolus administration of intravenous contrast. RADIATION DOSE REDUCTION: This exam was performed according to the departmental dose-optimization program which includes automated exposure control, adjustment of  the mA and/or kV according to patient size and/or use of iterative reconstruction technique. CONTRAST:  152m OMNIPAQUE IOHEXOL 350 MG/ML SOLN COMPARISON:  CT abdomen and pelvis 08/29/2013 FINDINGS: Lower chest: Heart is enlarged.  Lung bases are clear. Hepatobiliary: Heterogeneously enhancing lesion in the dome of the liver measuring 2.2 x 2.2 cm was not definitely seen on the prior examination. Gallbladder surgically absent. There is no biliary ductal dilatation. Pancreas: Unremarkable. No pancreatic ductal dilatation or surrounding inflammatory changes. Spleen: Small rounded area of enhancement favored as flash fill hemangioma in the spleen measuring 9 mm. Spleen is nonenlarged. Adrenals/Urinary Tract: Adrenal glands are unremarkable. Kidneys are normal, without renal calculi, focal lesion, or hydronephrosis. Bladder is unremarkable. Stomach/Bowel: Stomach is within normal limits. Appendix appears normal. No evidence of bowel wall thickening, distention, or inflammatory changes. Vascular/Lymphatic: Aortic atherosclerosis. No enlarged  abdominal or pelvic lymph nodes. Reproductive: Status post hysterectomy. No adnexal masses. Other: There are small fat containing inguinal and umbilical hernias. There is no ascites or free air. Musculoskeletal: There are severe degenerative changes throughout the spine. IMPRESSION: 1. No acute process in the abdomen or pelvis. 2. Heterogeneously enhancing lesion in the dome of the liver is new from prior and may represent a hemangioma, but is incompletely characterized on this study. Recommend further characterization with MRI. 3.  Aortic Atherosclerosis (ICD10-I70.0). Electronically Signed   By: ARonney AstersM.D.   On: 05/27/2021 20:36        Scheduled Meds:  atorvastatin  40 mg Oral QPM   insulin aspart  0-5 Units Subcutaneous QHS   insulin aspart  0-9 Units Subcutaneous TID WC   levothyroxine  125 mcg Oral QAC breakfast   pantoprazole  40 mg Oral BID   [START ON 05/29/2021] polyethylene glycol  17 g Oral Daily   Continuous Infusions:  sodium chloride     sodium chloride 75 mL/hr at 05/28/21 0251     LOS: 4 days    Time spent: 35 minutes    KBarb Merino MD [facility] Pager [phone]

## 2021-05-28 NOTE — Progress Notes (Signed)
Occupational Therapy Treatment ?Patient Details ?Name: Tammy Good ?MRN: [id number] ?DOB: 08/13/1950 ?Today's Date: 05/28/2021 ? ? ?History of present illness 71 y.o. female who presented from home via EMS 05/24/21 with complaints of gradually worsening generalized weakness and nausea with poor intake. Hgb 5.6 (8.5 after 2 units of PRBC), acute renal insufficiency, hypovolemic, V/Q scan negative for PE   PMH significant for left ankle infection, MRSA bacteremia (04/22/21) tenosynovitis of left ankle status post I&D, hypertension, hyperlipidemia, type 2 diabetes, anemia, paroxysmal A-fib on Eliquis, coronary artery disease status post PCI with stent, obesity ?  ?OT comments ? Patient received in bed and eager to get out of bed. Patient was able to get to EOB and was min guard to transfer to recliner. Patient asked to brush teeth and began to feel nauseous but did not vomit.  Patient remained in recliner and stated symptoms subsided. Discussed fall prevention. Acute OT to continue to follow.   ? ?Recommendations for follow up therapy are one component of a multi-disciplinary discharge planning process, led by the attending physician.  Recommendations may be updated based on patient status, additional functional criteria and insurance authorization. ?   ?Follow Up Recommendations ? No OT follow up  ?  ?Assistance Recommended at Discharge PRN  ?Patient can return home with the following ? A little help with walking and/or transfers;A little help with bathing/dressing/bathroom ?  ?Equipment Recommendations ? None recommended by OT  ?  ?Recommendations for Other Services   ? ?  ?Precautions / Restrictions Precautions ?Precautions: Fall ?Precaution Comments: reports one fall ?Restrictions ?Weight Bearing Restrictions: No  ? ? ?  ? ?Mobility Bed Mobility ?Overal bed mobility: Modified Independent ?  ?  ?  ?  ?  ?  ?General bed mobility comments: HOB raised, no rail ?  ? ?Transfers ?Overall transfer level: Needs  assistance ?Equipment used: 1 person hand held assist ?Transfers: Sit to/from Stand ?Sit to Stand: Min guard ?  ?  ?  ?  ?  ?General transfer comment: performed transfer from EOB to recliner to simulate toilet transfer with min guard. did not feel nauseus at first but began to feel nauseous after sitting up a few minutes ?  ?  ?Balance Overall balance assessment: Needs assistance ?Sitting-balance support: No upper extremity supported, Feet supported ?Sitting balance-Leahy Scale: Good ?  ?  ?Standing balance support: Single extremity supported, During functional activity ?Standing balance-Leahy Scale: Poor ?Standing balance comment: stood for transfer to recliner ?  ?  ?  ?  ?  ?  ?  ?  ?  ?  ?  ?   ? ?ADL either performed or assessed with clinical judgement  ? ?ADL Overall ADL's : Modified independent;At baseline ?  ?  ?  ?  ?  ?  ?  ?  ?  ?  ?  ?  ?  ?  ?  ?  ?  ?  ?  ?General ADL Comments: performed oral hygiene seated ?  ? ?Extremity/Trunk Assessment   ?  ?  ?  ?  ?  ? ?Vision   ?  ?  ?Perception   ?  ?Praxis   ?  ? ?Cognition Arousal/Alertness: Awake/alert ?Behavior During Therapy: [facility] for tasks assessed/performed ?Overall Cognitive Status: Within Functional Limits for tasks assessed ?  ?  ?  ?  ?  ?  ?  ?  ?  ?  ?  ?  ?  ?  ?  ?  ?General  Comments: alert and eager to get mobile ?  ?  ?   ?Exercises   ? ?  ?Shoulder Instructions   ? ? ?  ?General Comments    ? ? ?Pertinent Vitals/ Pain       Pain Assessment ?Pain Assessment: No/denies pain ? ?Home Living   ?  ?  ?  ?  ?  ?  ?  ?  ?  ?  ?  ?  ?  ?  ?  ?  ?  ?  ? ?  ?Prior Functioning/Environment    ?  ?  ?  ?   ? ?Frequency ? Min 2X/week  ? ? ? ? ?  ?Progress Toward Goals ? ?OT Goals(current goals can now be found in the care plan section) ? Progress towards OT goals: Progressing toward goals ? ?Acute Rehab OT Goals ?Patient Stated Goal: get better ?OT Goal Formulation: With patient ?Time For Goal Achievement: 06/09/21 ?Potential to Achieve Goals: Good ?ADL  Goals ?Pt Will Transfer to Toilet: with modified independence;ambulating ?Pt Will Perform Toileting - Clothing Manipulation and hygiene: with modified independence;sitting/lateral leans;sit to/from stand ?Additional ADL Goal #1: Pt will report 3 fall prevention techniques that she can use at home  ?Plan Discharge plan remains appropriate   ? ?Co-evaluation ? ? ?   ?  ?  ?  ?  ? ?  ?AM-PAC OT "6 Clicks" Daily Activity     ?Outcome Measure ? ? Help from another person eating meals?: None ?Help from another person taking care of personal grooming?: None ?Help from another person toileting, which includes using toliet, bedpan, or urinal?: None ?Help from another person bathing (including washing, rinsing, drying)?: None ?Help from another person to put on and taking off regular upper body clothing?: None ?Help from another person to put on and taking off regular lower body clothing?: None ?6 Click Score: 24 ? ?  ?End of Session   ? ?OT Visit Diagnosis: Unsteadiness on feet (R26.81);Other abnormalities of gait and mobility (R26.89);Muscle weakness (generalized) (M62.81) ?  ?Activity Tolerance Patient tolerated treatment well ?  ?Patient Left in chair;with call bell/phone within reach;with chair alarm set ?  ?Nurse Communication Mobility status;Other (comment) (about being nauseous) ?  ? ?   ? ?Time: 9163-8466 ?OT Time Calculation (min): 25 min ? ?Charges: OT General Charges ?$OT Visit: 1 Visit ?OT Treatments ?$Self Care/Home Management : 23-37 mins ? ?Lodema Hong, OTA ?Acute Rehabilitation Services  ?Pager [phone] ?Office [phone] ? ? ?Trixie Dredge ?05/28/2021, 9:26 AM ?

## 2021-05-29 DIAGNOSIS — D649 Anemia, unspecified: Secondary | ICD-10-CM | POA: Diagnosis not present

## 2021-05-29 DIAGNOSIS — E1100 Type 2 diabetes mellitus with hyperosmolarity without nonketotic hyperglycemic-hyperosmolar coma (NKHHC): Secondary | ICD-10-CM

## 2021-05-29 DIAGNOSIS — K769 Liver disease, unspecified: Secondary | ICD-10-CM | POA: Diagnosis present

## 2021-05-29 DIAGNOSIS — N179 Acute kidney failure, unspecified: Secondary | ICD-10-CM | POA: Diagnosis not present

## 2021-05-29 DIAGNOSIS — K922 Gastrointestinal hemorrhage, unspecified: Secondary | ICD-10-CM | POA: Diagnosis present

## 2021-05-29 LAB — GLUCOSE, CAPILLARY
Glucose-Capillary: 136 mg/dL — ABNORMAL HIGH (ref 70–99)
Glucose-Capillary: 116 mg/dL — ABNORMAL HIGH (ref 70–99)
Glucose-Capillary: 107 mg/dL — ABNORMAL HIGH (ref 70–99)
Glucose-Capillary: 110 mg/dL — ABNORMAL HIGH (ref 70–99)

## 2021-05-29 LAB — PATHOLOGIST SMEAR REVIEW

## 2021-05-29 MED ORDER — SORBITOL 70 % SOLN
960.0000 mL | TOPICAL_OIL | Freq: Once | ORAL | Status: AC
  Administered 2021-05-29: 960 mL via RECTAL
  Filled 2021-05-29: qty 473

## 2021-05-29 NOTE — Assessment & Plan Note (Addendum)
-   Presented with acute blood loss anemia, has history of chronic iron deficiency anemia in the setting of eliquis and Plavix ?-Presented with hemoglobin of 5.6, received 2 units packed RBCs.  Placed on IV PPI. ?-GI was consulted, upper endoscopy 3/5 with esophageal ulcer, no active bleeding. ?-Hemoglobin continued to trend down, no obvious bleeding, hemoglobin trended down to 7.6 on 3/9, transfused 2 units packed RBCs  ?-GI reconsulted, tagged RBC scan negative.  H&H stable. ?-Discussed with vascular surgery, [name] on 3/10, patient did not have stent placed in 03/2021 after abdominal aortogram hence she can come off Plavix, can continue aspirin 81 mg daily.   ?-Discussed with cardiology, Dr. Caryl Comes regarding Eliquis (for paroxysmal A-fib) (patient follows Dr. Ellyn Hack), given CHA2DS2-VASc score 6 (HTN, diabetes, vascular disease, CHF, age/f), recommended resuming Eliquis.  If patient bleeds or hemoglobin drops down with eliquis, then discontinue AC, patient will then meet criteria for Watchman procedure and then be placed on aspirin 81.  D/w Dr Ellyn Hack, who agrees. ?-Placed on eliquis on 3/11, H&H has remained stable. ? ?

## 2021-05-29 NOTE — Plan of Care (Signed)

## 2021-05-29 NOTE — Assessment & Plan Note (Addendum)
-    EGD 3/5 showed esophageal ulcer, no active bleeding ?-Continue Protonix 40 mg twice daily ?-Status post 2 units packed RBCs on 3/9, ?-Tagged RBC scan negative.  ?-Resumed Eliquis on 3/11, H&H has remained stable and improving. ?

## 2021-05-29 NOTE — Assessment & Plan Note (Signed)
-   Patient had recent MRSA infection treated with 2 weeks of vancomycin, intolerance to Zyvox. ?-CT of the abdomen showed 2x 2cm heterogeneous mass in the liver.  Left ankle clinically improving ?-MRI of the liver consistent with benign cyst or hemangioma ?

## 2021-05-29 NOTE — Progress Notes (Signed)
PT Cancellation Note ? ?Patient Details ?Name: Tammy Good ?MRN: [id number] ?DOB: Dec 22, 1950 ? ? ?Cancelled Treatment:    Reason Eval/Treat Not Completed: Medical issues which prohibited therapy. Pt declining x 2 attempts due to N&V.  ? ? ?[name] ?05/29/2021, 12:38 PM ? ?[name], PT  ?Office # [phone] ?Pager [phone] ? ?

## 2021-05-29 NOTE — Assessment & Plan Note (Addendum)
-  Borderline BP secondary to relative adrenal insufficiency.  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  Patient will resume oral Decadron and continue Synthroid. ?-BP now stable, decreased midodrine to 5 mg p.o. twice daily, can wean off at the time of follow-up appointment with PCP ? ?

## 2021-05-29 NOTE — Assessment & Plan Note (Addendum)
-   Presented with creatinine of 1.12, BUN 26 ?-Mild creatinine bump with IV Lasix, tolerating diet and drinking well.  No further need of Lasix. ?

## 2021-05-29 NOTE — Assessment & Plan Note (Addendum)
-   PT OT evaluation-> close to her baseline.  No PT needs ?

## 2021-05-29 NOTE — Hospital Course (Signed)
71 year old female with history of left ankle infection, MRSA bacteremia and tenosynovitis of the left ankle status post I&D, essential hypertension, hyperlipidemia, type 2 diabetes, chronic iron deficiency anemia with baseline hemoglobin about 8, hypothyroidism, paroxysmal A-fib on Eliquis, GERD, coronary artery disease status post PCI with stent presented from home with gradually worsening generalized weakness for 2 days, poor appetite and ongoing chronic nausea.  At the emergency room blood pressure stable.  Hemoglobin 5.6, creatinine 1.89 from baseline 0.78. ?GI was consulted.  Underwent EGD. ?

## 2021-05-29 NOTE — Assessment & Plan Note (Addendum)
-   She had aortic stent placed within a year, on Plavix and Eliquis.  Outpatient follow-up with cardiology and vascular surgery. ?-Please see above regarding eliquis and Plavix ?

## 2021-05-29 NOTE — Assessment & Plan Note (Addendum)
-   Nausea improving, tolerating soft diet ?-Continue Reglan, Phenergan as needed ?

## 2021-05-29 NOTE — Progress Notes (Signed)
[facility]                                                                               Tammy Good, is a 71 y.o. female, DOB - 1950/05/04, [place] date - 05/24/2021    Outpatient Primary MD for the patient is Tammy Bowen, MD  LOS - 5  days    Brief summary   71 year old female with history of left ankle infection, MRSA bacteremia and tenosynovitis of the left ankle status post I&D, essential hypertension, hyperlipidemia, type 2 diabetes, chronic iron deficiency anemia with baseline hemoglobin about 8, hypothyroidism, paroxysmal A-fib on Eliquis, GERD, coronary artery disease status post PCI with stent presented from home with gradually worsening generalized weakness for 2 days, poor appetite and ongoing chronic nausea.  At the emergency room blood pressure stable.  Hemoglobin 5.6, creatinine 1.89 from baseline 0.78. GI was consulted.  Underwent EGD.   Assessment & Plan    Assessment and Plan: * Symptomatic anemia - Presented with acute blood loss anemia, has history of chronic iron deficiency anemia in the setting of eliquis and Plavix -Presented with hemoglobin of 5.6, received 2 units packed RBCs.  Placed on IV PPI. -GI consulted, upper endoscopy 3/5 with esophageal ulcer, no active bleeding. -Complaining of persistent nausea, continue IV fluids  GI bleed - H&H currently stable, EGD showed esophageal ulcer, no active bleeding -Continue Protonix 40 mg twice daily  Liver lesion - Patient had recent MRSA infection treated with 2 weeks of vancomycin, intolerance to Zyvox. -CT of the abdomen showed 2x 2cm heterogeneous mass in the liver.  Left ankle clinically improving -MRI of the liver consistent with benign cyst or hemangioma  AKI (acute kidney injury) ([place]) - Presented with creatinine of 1.12, BUN 26 -Resolved, creatinine 0.8  Essential hypertension - BP currently soft, placed on IV fluid hydration.  If continues to remain lower or  symptomatic, may need to place on midodrine.  Generalized weakness - PT OT evaluation  PAD (peripheral artery disease) (HCC) - She had aortic stent placed within a year, remains on Plavix and Eliquis.  Outpatient follow-up with cardiology and vascular surgery.  Diabetes mellitus, type 2 (HCC) Continue sliding scale insulin -Hemoglobin A1c 7.2 on 04/23/2021  Nausea - Also reports persistent nausea, has not had a BM in 3 to 4 days -Smog enema x1.   Code Status:  DVT Prophylaxis:  SCDs Start: 05/24/21 2214   Level of Care: Level of care: Telemetry Medical Family Communication: Updated patient's  Disposition Plan:     Remains inpatient appropriate: Hopefully DC home in next 24 to 48 hours if nausea improves and tolerating diet  Procedures:  EGD  Consultants:   Gastroenterology  Antimicrobials:   Anti-infectives (From admission, onward)    None        Medications   atorvastatin  40 mg Oral QPM   insulin aspart  0-5 Units Subcutaneous QHS   insulin aspart  0-9 Units Subcutaneous TID WC   levothyroxine  125 mcg Oral QAC breakfast   pantoprazole  40 mg Oral BID   polyethylene glycol  17 g Oral Daily     Subjective:   Tammy Good  was seen and examined today.  Complaining of persistent nausea, constipation for last 3 days.  Denies any chest pain, shortness of breath, abdominal pain, N/V/D/C, new weakness, numbess, tingling. No acute events overnight.    Objective:   Vitals:   05/29/21 0106 05/29/21 0417 05/29/21 0758 05/29/21 1209  BP: (!) 103/49 (!) 110/45 (!) 99/48   Pulse: (!) 102 100 100 100  Resp: '19 20 19   '$ Temp: 99 F (37.2 C) 98.3 F (36.8 C) 99.7 F (37.6 C) 100.2 F (37.9 C)  TempSrc:  Oral Oral Oral  SpO2: 99% 99%    Weight:      Height:        Intake/Output Summary (Last 24 hours) at 05/29/2021 1538 Last data filed at 05/29/2021 0900 Gross per 24 hour  Intake --  Output 2800 ml  Net -2800 ml   Filed Weights   05/24/21 1713 05/25/21  1330  Weight: 99.8 kg 96.5 kg     Exam General: Alert and oriented x 3, NAD Cardiovascular: S1 S2 auscultated Respiratory: Clear to auscultation bilaterally, no wheezing Gastrointestinal: Soft, nontender, nondistended, + bowel sounds Ext: no pedal edema bilaterally Neuro: no new deficits Skin: Left ankle and foot surgical incisions Psych: Normal affect and demeanor, alert and oriented x3    Data Reviewed:  I have personally reviewed following labs    CBC Lab Results  Component Value Date   WBC 16.9 (H) 05/28/2021   RBC 2.82 (L) 05/28/2021   HGB 7.9 (L) 05/28/2021   HCT 25.4 (L) 05/28/2021   MCV 90.1 05/28/2021   MCH 28.0 05/28/2021   PLT 231 05/28/2021   MCHC 31.1 05/28/2021   RDW 17.5 (H) 05/28/2021   LYMPHSABS 1.7 05/28/2021   MONOABS 0.7 05/28/2021   EOSABS 0.5 05/28/2021   BASOSABS 0.0 93/73/4287     Last metabolic panel Lab Results  Component Value Date   NA 136 05/28/2021   K 3.8 05/28/2021   CL 104 05/28/2021   CO2 25 05/28/2021   BUN <5 (L) 05/28/2021   CREATININE 0.84 05/28/2021   GLUCOSE 128 (H) 05/28/2021   GFRNONAA >60 05/28/2021   GFRAA 21 (L) 06/23/2019   CALCIUM 7.8 (L) 05/28/2021   PHOS 2.3 (L) 05/26/2021   PROT 5.4 (L) 05/26/2021   ALBUMIN 2.5 (L) 05/26/2021   BILITOT 1.1 05/26/2021   ALKPHOS 72 05/26/2021   AST 18 05/26/2021   ALT 12 05/26/2021   ANIONGAP 7 05/28/2021    CBG (last 3)  Recent Labs    05/28/21 2033 05/29/21 0800 05/29/21 1211  GLUCAP 157* 136* 116*      Coagulation Profile: No results for input(s): INR, PROTIME in the last 168 hours.   Radiology Studies: I have personally reviewed the imaging studies  CT ABDOMEN PELVIS W CONTRAST  Result Date: 05/27/2021 CLINICAL DATA:  Nausea and vomiting, anemia. EXAM: CT ABDOMEN AND PELVIS WITH CONTRAST TECHNIQUE: Multidetector CT imaging of the abdomen and pelvis was performed using the standard protocol following bolus administration of intravenous contrast. RADIATION  DOSE REDUCTION: This exam was performed according to the departmental dose-optimization program which includes automated exposure control, adjustment of the mA and/or kV according to patient size and/or use of iterative reconstruction technique. CONTRAST:  133m OMNIPAQUE IOHEXOL 350 MG/ML SOLN COMPARISON:  CT abdomen and pelvis 08/29/2013 FINDINGS: Lower chest: Heart is enlarged.  Lung bases are clear. Hepatobiliary: Heterogeneously enhancing lesion in the dome of the liver measuring 2.2 x 2.2 cm was not definitely seen on the  prior examination. Gallbladder surgically absent. There is no biliary ductal dilatation. Pancreas: Unremarkable. No pancreatic ductal dilatation or surrounding inflammatory changes. Spleen: Small rounded area of enhancement favored as flash fill hemangioma in the spleen measuring 9 mm. Spleen is nonenlarged. Adrenals/Urinary Tract: Adrenal glands are unremarkable. Kidneys are normal, without renal calculi, focal lesion, or hydronephrosis. Bladder is unremarkable. Stomach/Bowel: Stomach is within normal limits. Appendix appears normal. No evidence of bowel wall thickening, distention, or inflammatory changes. Vascular/Lymphatic: Aortic atherosclerosis. No enlarged abdominal or pelvic lymph nodes. Reproductive: Status post hysterectomy. No adnexal masses. Other: There are small fat containing inguinal and umbilical hernias. There is no ascites or free air. Musculoskeletal: There are severe degenerative changes throughout the spine. IMPRESSION: 1. No acute process in the abdomen or pelvis. 2. Heterogeneously enhancing lesion in the dome of the liver is new from prior and may represent a hemangioma, but is incompletely characterized on this study. Recommend further characterization with MRI. 3.  Aortic Atherosclerosis (ICD10-I70.0). Electronically Signed   By: Ronney Asters M.D.   On: 05/27/2021 20:36   MR LIVER WO CONRTAST  Result Date: 05/28/2021 CLINICAL DATA:  Indeterminate liver lesion on  CT abdomen. MRI recommended for further evaluation. EXAM: MRI ABDOMEN WITHOUT CONTRAST TECHNIQUE: Multiplanar multisequence MR imaging was performed without the administration of intravenous contrast. COMPARISON:  CT 05/27/2021 FINDINGS: Lower chest:  Lung bases are clear. Hepatobiliary: Well-circumscribed lesion in the dome liver is hyperintense on T2 weighted imaging measuring 2.3 by 1.7 cm on image 9/series 3. This corresponds to the lesion of concern on comparison CT. No IV contrast administered. Smaller similar lesion in the lateral aspect of the LEFT hepatic lobe measures 1 cm on image 20/series 3. No biliary duct dilatation. Postcholecystectomy. Common bile duct normal. Pancreas: Normal pancreatic parenchymal intensity. No ductal dilatation or inflammation. Spleen: 1 cm round hyperintense lesion on T2 weighted imaging in the spleen on image 19/3. Adrenals/urinary tract: Adrenal glands and kidneys are normal. Stomach/Bowel: Stomach and limited of the small bowel is unremarkable Vascular/Lymphatic: Abdominal aortic normal caliber. No retroperitoneal periportal lymphadenopathy. Musculoskeletal: No aggressive osseous lesion.  Scoliosis spine IMPRESSION: 1. Lesion in the dome of the liver has imaging characteristics most consistent with a benign cyst or hemangioma. Lesion cannot be fully characterized s without IV contrast. 2. Smaller similar cyst or hemangioma in the LEFT lateral hepatic lobe. 3. Probable cysts or hemangioma within the spleen. Electronically Signed   By: Suzy Bouchard M.D.   On: 05/28/2021 16:57       Draven Natter M.D. [facility] 05/29/2021, 3:38 PM  Available via Epic secure chat 7am-7pm After 7 pm, please refer to night coverage provider listed on amion.

## 2021-05-29 NOTE — Assessment & Plan Note (Addendum)
-  Hemoglobin A1c 7.2 on 04/23/2021 ?-CBGs improving, continue outpatient regimen ?

## 2021-05-30 ENCOUNTER — Encounter (HOSPITAL_BASED_OUTPATIENT_CLINIC_OR_DEPARTMENT_OTHER): Payer: Medicare Other | Admitting: General Surgery

## 2021-05-30 DIAGNOSIS — N179 Acute kidney failure, unspecified: Secondary | ICD-10-CM | POA: Diagnosis not present

## 2021-05-30 DIAGNOSIS — I10 Essential (primary) hypertension: Secondary | ICD-10-CM | POA: Diagnosis not present

## 2021-05-30 DIAGNOSIS — E1100 Type 2 diabetes mellitus with hyperosmolarity without nonketotic hyperglycemic-hyperosmolar coma (NKHHC): Secondary | ICD-10-CM | POA: Diagnosis not present

## 2021-05-30 DIAGNOSIS — D649 Anemia, unspecified: Secondary | ICD-10-CM | POA: Diagnosis not present

## 2021-05-30 DIAGNOSIS — E876 Hypokalemia: Secondary | ICD-10-CM

## 2021-05-30 LAB — CBC
HCT: 26.1 % — ABNORMAL LOW (ref 36.0–46.0)
Hemoglobin: 7.6 g/dL — ABNORMAL LOW (ref 12.0–15.0)
MCH: 26.8 pg (ref 26.0–34.0)
MCHC: 29.1 g/dL — ABNORMAL LOW (ref 30.0–36.0)
MCV: 91.9 fL (ref 80.0–100.0)
Platelets: 284 10*3/uL (ref 150–400)
RBC: 2.84 MIL/uL — ABNORMAL LOW (ref 3.87–5.11)
RDW: 17.9 % — ABNORMAL HIGH (ref 11.5–15.5)
WBC: 8.8 10*3/uL (ref 4.0–10.5)
nRBC: 0.2 % (ref 0.0–0.2)

## 2021-05-30 LAB — BASIC METABOLIC PANEL
Anion gap: 9 (ref 5–15)
BUN: <5 mg/dL — ABNORMAL LOW (ref 8–23)
CO2: 25 mmol/L (ref 22–32)
Calcium: 7.7 mg/dL — ABNORMAL LOW (ref 8.9–10.3)
Chloride: 106 mmol/L (ref 98–111)
Creatinine, Ser: 1.03 mg/dL — ABNORMAL HIGH (ref 0.44–1.00)
GFR, Estimated: 59 mL/min — ABNORMAL LOW (ref >60)
Glucose, Bld: 121 mg/dL — ABNORMAL HIGH (ref 70–99)
Potassium: 3.3 mmol/L — ABNORMAL LOW (ref 3.5–5.1)
Sodium: 140 mmol/L (ref 135–145)

## 2021-05-30 LAB — GLUCOSE, CAPILLARY
Glucose-Capillary: 106 mg/dL — ABNORMAL HIGH (ref 70–99)
Glucose-Capillary: 114 mg/dL — ABNORMAL HIGH (ref 70–99)
Glucose-Capillary: 110 mg/dL — ABNORMAL HIGH (ref 70–99)
Glucose-Capillary: 126 mg/dL — ABNORMAL HIGH (ref 70–99)

## 2021-05-30 LAB — VITAMIN B12: Vitamin B-12: 1893 pg/mL — ABNORMAL HIGH (ref 180–914)

## 2021-05-30 LAB — FOLATE: Folate: 15 ng/mL (ref >5.9)

## 2021-05-30 LAB — PREPARE RBC (CROSSMATCH)

## 2021-05-30 LAB — CORTISOL: Cortisol, Plasma: 2.9 ug/dL

## 2021-05-30 MED ORDER — METOCLOPRAMIDE HCL 5 MG/ML IJ SOLN
5.0000 mg | Freq: Three times a day (TID) | INTRAMUSCULAR | Status: DC
  Administered 2021-05-30 – 2021-06-03 (×9): 5 mg via INTRAVENOUS
  Filled 2021-05-30 (×10): qty 2

## 2021-05-30 MED ORDER — POTASSIUM CHLORIDE CRYS ER 20 MEQ PO TBCR
40.0000 meq | EXTENDED_RELEASE_TABLET | Freq: Once | ORAL | Status: AC
  Administered 2021-05-30: 18:00:00 40 meq via ORAL
  Filled 2021-05-30: qty 2

## 2021-05-30 MED ORDER — SODIUM CHLORIDE 0.9 % IV BOLUS
1000.0000 mL | Freq: Once | INTRAVENOUS | Status: AC
  Administered 2021-05-30: 17:00:00 1000 mL via INTRAVENOUS

## 2021-05-30 MED ORDER — SODIUM CHLORIDE 0.9% IV SOLUTION: Freq: Once | INTRAVENOUS | Status: DC

## 2021-05-30 MED ORDER — MIDODRINE HCL 5 MG PO TABS
5.0000 mg | ORAL_TABLET | Freq: Three times a day (TID) | ORAL | Status: DC
  Administered 2021-05-30 – 2021-06-03 (×10): 5 mg via ORAL
  Filled 2021-05-30 (×10): qty 1

## 2021-05-30 NOTE — Progress Notes (Signed)
SATURATION QUALIFICATIONS: (This note is used to comply with regulatory documentation for home oxygen) ? ?Patient Saturations on Room Air at Rest supine= 93% ? ?Patient Saturations on Room Air while sitting EOB = 85% ? ?Patient Saturations on 2 Liters of oxygen while Ambulating = 93% ? ?Please briefly explain why patient needs home oxygen: ? ?To maintain oxygen sats >87% during functional activities/mobility.  ? ? ?Arby Barrette, PT ?Acute Rehabilitation Services  ?Pager [phone] ?Office [phone] ? ?

## 2021-05-30 NOTE — Assessment & Plan Note (Addendum)
-   Replaced p.o. x1 on 3/9 ?

## 2021-05-30 NOTE — Progress Notes (Signed)
Physical Therapy Treatment ?Patient Details ?Name: Tammy Good ?MRN: [id number] ?DOB: 1950/05/29 ?Today's Date: 05/30/2021 ? ? ?History of Present Illness 71 y.o. female who presented from home via EMS 05/24/21 with complaints of gradually worsening generalized weakness and nausea with poor intake. Hgb 5.6 (8.5 after 2 units of PRBC), acute renal insufficiency, hypovolemic, V/Q scan negative for PE   PMH significant for left ankle infection, MRSA bacteremia (04/22/21) tenosynovitis of left ankle status post I&D, hypertension, hyperlipidemia, type 2 diabetes, anemia, paroxysmal A-fib on Eliquis, coronary artery disease status post PCI with stent, obesity ? ?  ?PT Comments  ? ? Patient severely limited by hypotension and dry-heaving when she came to sit at EOB. Required return to supine and dry heaving stopped.  ?Supine BP 100/49  HR 110 ?Sit BP          87/70  HR 123 ?Supine BP  112/50  HR 118 ? ?See oxygen qualification note for oxygen sats. RN made aware and in to assess pt.  ?   ?Recommendations for follow up therapy are one component of a multi-disciplinary discharge planning process, led by the attending physician.  Recommendations may be updated based on patient status, additional functional criteria and insurance authorization. ? ?Follow Up Recommendations ? No PT follow up (will continue to monitor as pt has been too ill to participate) ?  ?  ?Assistance Recommended at Discharge PRN  ?Patient can return home with the following Assistance with cooking/housework;Assist for transportation;Help with stairs or ramp for entrance ?  ?Equipment Recommendations ? None recommended by PT  ?  ?Recommendations for Other Services   ? ? ?  ?Precautions / Restrictions Precautions ?Precautions: Fall ?Precaution Comments: reports one fall ?Restrictions ?Weight Bearing Restrictions: No  ?  ? ?Mobility ? Bed Mobility ?Overal bed mobility: Needs Assistance ?Bed Mobility: Supine to Sit, Sit to Supine ?  ?  ?Supine to sit:  Supervision ?Sit to supine: Supervision ?  ?General bed mobility comments: supervision for safety due to low BP; HOB raised, no rail ?  ? ?Transfers ?  ?  ?  ?  ?  ?  ?  ?  ?  ?General transfer comment: unable to progress to standing due to severe nausea with dry heaving, and then spitting up white frothy ?sputum ?  ? ?Ambulation/Gait ?  ?  ?  ?  ?  ?  ?  ?General Gait Details: unable to progress to standing due to severe nausea with dry heaving, and then spitting up white frothy ?sputum ? ? ?Stairs ?  ?  ?  ?  ?  ? ? ?Wheelchair Mobility ?  ? ?Modified Rankin (Stroke Patients Only) ?  ? ? ?  ?Balance Overall balance assessment: Needs assistance ?Sitting-balance support: No upper extremity supported, Feet supported ?Sitting balance-Leahy Scale: Good ?  ?  ?  ?  ?  ?  ?  ?  ?  ?  ?  ?  ?  ?  ?  ?  ?  ? ?  ?Cognition Arousal/Alertness: Awake/alert ?Behavior During Therapy: [facility] for tasks assessed/performed ?Overall Cognitive Status: Within Functional Limits for tasks assessed ?  ?  ?  ?  ?  ?  ?  ?  ?  ?  ?  ?  ?  ?  ?  ?  ?  ?  ?  ? ?  ?Exercises   ? ?  ?General Comments General comments (skin integrity, edema, etc.): On 2L at rest with sats 99%;  on room air sats dropped to 85% sitting EOB. 2L resumed with sats 93% on exit ?  ?  ? ?Pertinent Vitals/Pain Pain Assessment ?Pain Assessment: No/denies pain  ? ? ?Home Living   ?  ?  ?  ?  ?  ?  ?  ?  ?  ?   ?  ?Prior Function    ?  ?  ?   ? ?PT Goals (current goals can now be found in the care plan section) Acute Rehab PT Goals ?Patient Stated Goal: return home feeling better ?Time For Goal Achievement: 06/09/21 ?Potential to Achieve Goals: Good ?Progress towards PT goals: Not progressing toward goals - comment (N/V and hypotensive) ? ?  ?Frequency ? ? ? Min 3X/week ? ? ? ?  ?PT Plan Current plan remains appropriate (once medically stable; will continue to monitor)  ? ? ?Co-evaluation   ?  ?  ?  ?  ? ?  ?AM-PAC PT "6 Clicks" Mobility   ?Outcome Measure ? Help needed turning  from your back to your side while in a flat bed without using bedrails?: None ?Help needed moving from lying on your back to sitting on the side of a flat bed without using bedrails?: None ?Help needed moving to and from a bed to a chair (including a wheelchair)?: A Little ?Help needed standing up from a chair using your arms (e.g., wheelchair or bedside chair)?: A Little ?Help needed to walk in hospital room?: A Little ?Help needed climbing 3-5 steps with a railing? : A Little ?6 Click Score: 20 ? ?  ?End of Session Equipment Utilized During Treatment: Oxygen ?Activity Tolerance: Treatment limited secondary to medical complications (Comment) (nausea; vomiting, decr BP) ?Patient left: in bed;with call bell/phone within reach;with bed alarm set ?Nurse Communication: Mobility status;Other (comment) (need for anti-nausea meds, BP drop) ?PT Visit Diagnosis: Unsteadiness on feet (R26.81);Muscle weakness (generalized) (M62.81) ?  ? ? ?Time: 1610-9604 ?PT Time Calculation (min) (ACUTE ONLY): 23 min ? ?Charges:  $Therapeutic Activity: 8-22 mins          ?          ? ? ?Arby Barrette, PT ?Acute Rehabilitation Services  ?Pager [phone] ?Office [phone] ? ? ? ?Jeanie Cooks Aloura Matsuoka ?05/30/2021, 3:32 PM ? ?

## 2021-05-30 NOTE — Progress Notes (Addendum)
? ? ? [facility] ?                                                                            ? ? ?Tammy Good, is a 71 y.o. female, DOB - 11-19-1950, DXI:[id number] ?Admit date - 05/24/2021    ?Outpatient Primary MD for the patient is [name], MD ? ?LOS - 6  days ? ? ? ?Brief summary  ? ?71 year old female with history of left ankle infection, MRSA bacteremia and tenosynovitis of the left ankle status post I&D, essential hypertension, hyperlipidemia, type 2 diabetes, chronic iron deficiency anemia with baseline hemoglobin about 8, hypothyroidism, paroxysmal A-fib on Eliquis, GERD, coronary artery disease status post PCI with stent presented from home with gradually worsening generalized weakness for 2 days, poor appetite and ongoing chronic nausea.  At the emergency room blood pressure stable.  Hemoglobin 5.6, creatinine 1.89 from baseline 0.78. ?GI was consulted.  Underwent EGD. ? ? ?Assessment & Plan  ? ? ?Assessment and Plan: ?* Symptomatic anemia ?- Presented with acute blood loss anemia, has history of chronic iron deficiency anemia in the setting of eliquis and Plavix ?-Presented with hemoglobin of 5.6, received 2 units packed RBCs.  Placed on IV PPI. ?-GI was consulted, upper endoscopy 3/5 with esophageal ulcer, no active bleeding. ?-Hemoglobin continues to trend down, no obvious bleeding, hemoglobin now down to 7.6, will give 2 units of packed RBCs ?-Reconsulted GI, discussed with Dr. Benson Norway, recommended tagged RBC scan and transfusion, will evaluate ?- Continue to hold Eliquis and Plavix, at the time of discharge will need to address both as high risk for rebleeding ? ?GI bleed ?- H&H currently stable, EGD showed esophageal ulcer, no active bleeding ?-Continue Protonix 40 mg twice daily ?-Continues to have worsening symptomatic anemia, hemoglobin down to 7.6, reconsulting GI again ? ?Liver lesion ?- Patient had recent MRSA infection treated with 2 weeks of vancomycin, intolerance to  Zyvox. ?-CT of the abdomen showed 2x 2cm heterogeneous mass in the liver.  Left ankle clinically improving ?-MRI of the liver consistent with benign cyst or hemangioma ? ?AKI (acute kidney injury) ([place]) ?- Presented with creatinine of 1.12, BUN 26 ?-Resolved ? ?Essential hypertension ?- Orthostatic and BP soft  ?-Given IV fluid bolus however hemoglobin down to 7.6, transfused 2 units packed RBCs ?-Also placed on midodrine ? ? ? ?Hypokalemia ?- Replaced p.o. x1 ? ?Generalized weakness ?- PT OT evaluation-> orthostatic and dizzy ? ?PAD (peripheral artery disease) ([place]) ?- She had aortic stent placed within a year, remains on Plavix and Eliquis.  Outpatient follow-up with cardiology and vascular surgery. ?-Currently Plavix and Eliquis both on hold ? ?Diabetes mellitus, type 2 ([place]) ?Continue sliding scale insulin ?-Hemoglobin A1c 7.2 on 04/23/2021 ? ?Nausea ?- Patient reports that nausea did improve after smog enema yesterday on 3/8 ?-Still has nausea, started on Reglan, continue PPI ? ? ?Code Status: Full CODE STATUS ?DVT Prophylaxis:  SCDs Start: 05/24/21 2214 ? ? ?Level of Care: Level of care: Telemetry Medical ?Family Communication:  ?Disposition Plan:     Remains inpatient appropriate: Symptomatic anemia ? ?Procedures:  ?EGD ? ?Consultants:   ?Gastroenterology ? ?Antimicrobials:  ?None ? ?Medications ? ? sodium chloride  Intravenous Once  ? atorvastatin  40 mg Oral QPM  ? insulin aspart  0-5 Units Subcutaneous QHS  ? insulin aspart  0-9 Units Subcutaneous TID WC  ? levothyroxine  125 mcg Oral QAC breakfast  ? metoCLOPramide (REGLAN) injection  5 mg Intravenous TID AC  ? midodrine  5 mg Oral TID WC  ? pantoprazole  40 mg Oral BID  ? polyethylene glycol  17 g Oral Daily  ? potassium chloride  40 mEq Oral Once  ? ? ? ?Subjective:  ? ?Skylee Baird was seen and examined today.  Still having persistent nausea, feeling weak and dizzy.  Orthostatic with PT.  No ongoing obvious GI bleed  ?Objective:  ? ?Vitals:  ?  05/30/21 0434 05/30/21 0824 05/30/21 1148 05/30/21 1551  ?BP: (!) 104/40 (!) 99/51 115/65 (!) 112/50  ?Pulse: (!) 103 100 (!) 109 (!) 101  ?Resp: '20 18 16 15  '$ ?Temp: 98.2 ?F (36.8 ?C) 98 ?F (36.7 ?C) 98.1 ?F (36.7 ?C)   ?TempSrc: Oral Oral Oral   ?SpO2: 94% 97% 98% 99%  ?Weight:      ?Height:      ? ? ?Intake/Output Summary (Last 24 hours) at 05/30/2021 1728 ?Last data filed at 05/30/2021 1100 ?Gross per 24 hour  ?Intake --  ?Output 2600 ml  ?Net -2600 ml  ? ?Filed Weights  ? 05/24/21 1713 05/25/21 1330  ?Weight: 99.8 kg 96.5 kg  ? ? ? ?Exam ?General: Alert and oriented x 3, NAD ?Cardiovascular: S1 S2 auscultated, no murmurs, RRR ?Respiratory: Clear to auscultation bilaterally, no wheezing, rales ?Gastrointestinal: Soft, nontender, nondistended, + bowel sounds ?Ext: no pedal edema bilaterally ?Neuro: no new deficits ?Psych: Normal affect and demeanor, alert and oriented x3  ? ? ?Data Reviewed:  I have personally reviewed following labs  ? ? ?CBC ?Lab Results  ?Component Value Date  ? WBC 8.8 05/30/2021  ? RBC 2.84 (L) 05/30/2021  ? HGB 7.6 (L) 05/30/2021  ? HCT 26.1 (L) 05/30/2021  ? MCV 91.9 05/30/2021  ? MCH 26.8 05/30/2021  ? PLT 284 05/30/2021  ? MCHC 29.1 (L) 05/30/2021  ? RDW 17.9 (H) 05/30/2021  ? LYMPHSABS 1.7 05/28/2021  ? MONOABS 0.7 05/28/2021  ? EOSABS 0.5 05/28/2021  ? BASOSABS 0.0 05/28/2021  ? ? ? ?Last metabolic panel ?Lab Results  ?Component Value Date  ? NA 140 05/30/2021  ? K 3.3 (L) 05/30/2021  ? CL 106 05/30/2021  ? CO2 25 05/30/2021  ? BUN <5 (L) 05/30/2021  ? CREATININE 1.03 (H) 05/30/2021  ? GLUCOSE 121 (H) 05/30/2021  ? GFRNONAA 59 (L) 05/30/2021  ? GFRAA 21 (L) 06/23/2019  ? CALCIUM 7.7 (L) 05/30/2021  ? PHOS 2.3 (L) 05/26/2021  ? PROT 5.4 (L) 05/26/2021  ? ALBUMIN 2.5 (L) 05/26/2021  ? BILITOT 1.1 05/26/2021  ? ALKPHOS 72 05/26/2021  ? AST 18 05/26/2021  ? ALT 12 05/26/2021  ? ANIONGAP 9 05/30/2021  ? ? ?CBG (last 3)  ?Recent Labs  ?  05/30/21 ?0826 05/30/21 ?1150 05/30/21 ?1554  ?GLUCAP 106*  114* 110*  ?  ? ? ? ? ? ?Estill Cotta M.D. ?[facility] ?05/30/2021, 5:28 PM ? ?Available via Epic secure chat 7am-7pm ?After 7 pm, please refer to night coverage provider listed on amion. ? ?  ?

## 2021-05-31 ENCOUNTER — Inpatient Hospital Stay (HOSPITAL_COMMUNITY): Payer: Medicare Other

## 2021-05-31 DIAGNOSIS — L8990 Pressure ulcer of unspecified site, unspecified stage: Secondary | ICD-10-CM | POA: Insufficient documentation

## 2021-05-31 DIAGNOSIS — D649 Anemia, unspecified: Secondary | ICD-10-CM | POA: Diagnosis not present

## 2021-05-31 DIAGNOSIS — N179 Acute kidney failure, unspecified: Secondary | ICD-10-CM | POA: Diagnosis not present

## 2021-05-31 DIAGNOSIS — E1100 Type 2 diabetes mellitus with hyperosmolarity without nonketotic hyperglycemic-hyperosmolar coma (NKHHC): Secondary | ICD-10-CM | POA: Diagnosis not present

## 2021-05-31 DIAGNOSIS — I10 Essential (primary) hypertension: Secondary | ICD-10-CM | POA: Diagnosis not present

## 2021-05-31 LAB — TYPE AND SCREEN
ABO/RH(D): O POS
Antibody Screen: NEGATIVE
Unit division: 0
Unit division: 0

## 2021-05-31 LAB — BPAM RBC
Blood Product Expiration Date: 202304052359
Blood Product Expiration Date: 202304052359
ISSUE DATE / TIME: 202303091942
ISSUE DATE / TIME: 202303092257
Unit Type and Rh: 5100
Unit Type and Rh: 5100

## 2021-05-31 LAB — GLUCOSE, CAPILLARY
Glucose-Capillary: 129 mg/dL — ABNORMAL HIGH (ref 70–99)
Glucose-Capillary: 127 mg/dL — ABNORMAL HIGH (ref 70–99)
Glucose-Capillary: 164 mg/dL — ABNORMAL HIGH (ref 70–99)

## 2021-05-31 LAB — CBC
HCT: 29.9 % — ABNORMAL LOW (ref 36.0–46.0)
Hemoglobin: 9.4 g/dL — ABNORMAL LOW (ref 12.0–15.0)
MCH: 28.3 pg (ref 26.0–34.0)
MCHC: 31.4 g/dL (ref 30.0–36.0)
MCV: 90.1 fL (ref 80.0–100.0)
Platelets: 312 10*3/uL (ref 150–400)
RBC: 3.32 MIL/uL — ABNORMAL LOW (ref 3.87–5.11)
RDW: 18.8 % — ABNORMAL HIGH (ref 11.5–15.5)
WBC: 7.3 10*3/uL (ref 4.0–10.5)
nRBC: 0 % (ref 0.0–0.2)

## 2021-05-31 LAB — CORTISOL: Cortisol, Plasma: 3.4 ug/dL

## 2021-05-31 MED ORDER — NYSTATIN 100000 UNIT/GM EX POWD
Freq: Two times a day (BID) | CUTANEOUS | Status: DC
  Filled 2021-05-31: qty 15

## 2021-05-31 MED ORDER — TECHNETIUM TC 99M-LABELED RED BLOOD CELLS IV KIT
23.2000 | PACK | Freq: Once | INTRAVENOUS | Status: AC | PRN
  Administered 2021-05-31: 23.2 via INTRAVENOUS

## 2021-05-31 MED ORDER — SODIUM CHLORIDE 0.9 % IV BOLUS: 1000.0000 mL | Freq: Once | INTRAVENOUS | Status: DC

## 2021-05-31 MED ORDER — HYDROCORTISONE SOD SUC (PF) 100 MG IJ SOLR
50.0000 mg | Freq: Three times a day (TID) | INTRAMUSCULAR | Status: DC
  Administered 2021-05-31 – 2021-06-01 (×3): 50 mg via INTRAVENOUS
  Filled 2021-05-31 (×3): qty 2

## 2021-05-31 NOTE — Progress Notes (Signed)
Pt transported off unit for procedure with transport at this time. ?

## 2021-05-31 NOTE — Progress Notes (Addendum)
? ? ? [facility] ?                                                                            ? ? ?Tammy Good, is a 71 y.o. female, DOB - 1950/12/08, ZDG:[id number] ?Admit date - 05/24/2021    ?Outpatient Primary MD for the patient is [name], MD ? ?LOS - 7  days ? ? ? ?Brief summary  ? ?71 year old female with history of left ankle infection, MRSA bacteremia and tenosynovitis of the left ankle status post I&D, essential hypertension, hyperlipidemia, type 2 diabetes, chronic iron deficiency anemia with baseline hemoglobin about 8, hypothyroidism, paroxysmal A-fib on Eliquis, GERD, coronary artery disease status post PCI with stent presented from home with gradually worsening generalized weakness for 2 days, poor appetite and ongoing chronic nausea.  At the emergency room blood pressure stable.  Hemoglobin 5.6, creatinine 1.89 from baseline 0.78. ?GI was consulted.  Underwent EGD. ? ? ?Assessment & Plan  ? ? ?Assessment and Plan: ?* Symptomatic anemia ?- Presented with acute blood loss anemia, has history of chronic iron deficiency anemia in the setting of eliquis and Plavix ?-Presented with hemoglobin of 5.6, received 2 units packed RBCs.  Placed on IV PPI. ?-GI was consulted, upper endoscopy 3/5 with esophageal ulcer, no active bleeding. ?-Hemoglobin continues to trend down, no obvious bleeding, hemoglobin trended down to 7.6 on 3/9, transfused 2 units packed RBCs  ?-Hemoglobin today 9.4 ?-GI reconsulted, follow-up NM RBC scan ?- Continue to hold Eliquis and Plavix, will need to address both at discharge as high risk for re-bleeding ?Addendum:  ?Tagged RBC scan negative, will await GI recommendations.  ?-Also discussed with vascular surgery, [name].  Patient had abdominal aortogram on 04/18/2021, if she had a stent placed, she will likely need Plavix and we can continue to hold Eliquis until she follows up with cardiology outpatient and H&H remains stable.   ? ? ?GI bleed ?-  EGD 3/5 showed  esophageal ulcer, no active bleeding ?-Continue Protonix 40 mg twice daily ?-Status post 2 units packed RBCs on 3/9, follow NM RBC scan, GI following ? ?Liver lesion ?- Patient had recent MRSA infection treated with 2 weeks of vancomycin, intolerance to Zyvox. ?-CT of the abdomen showed 2x 2cm heterogeneous mass in the liver.  Left ankle clinically improving ?-MRI of the liver consistent with benign cyst or hemangioma ? ?AKI (acute kidney injury) ([place]) ?- Presented with creatinine of 1.12, BUN 26 ?-Resolved ? ?Essential hypertension ?- Hypotensive and orthostatic, plasma cortisol level low random 2.9, this a.m. repeated 3.4 ?-Will place on Solu-Cortef 50 mg every 8 hours.  Continue midodrine. ?-B12, folate normal ? ? ?Hypokalemia ?- Replaced p.o. x1 on 3/9 ? ?Generalized weakness ?- PT OT evaluation ? ?PAD (peripheral artery disease) ([place]) ?- She had aortic stent placed within a year, on Plavix and Eliquis.  Outpatient follow-up with cardiology and vascular surgery. ?-Currently Plavix and Eliquis both on hold ? ?Diabetes mellitus, type 2 ([place]) ?Continue sliding scale insulin ?-Hemoglobin A1c 7.2 on 04/23/2021 ?-Follow CBGs closely, expected to rise with Solu-Cortef ? ?Nausea ?- Continues to have nausea, did improve after smog enema and Reglan ?-GI following ? ? ?Pressure Injury Documentation: ?Pressure  Injury 05/31/21 Sacrum Mid;Lower Stage 2 -  Partial thickness loss of dermis presenting as a shallow open injury with a red, pink wound bed without slough. denuded area between buttock on sacrum-pink and peeling (Active)  ?05/31/21 0300  ?Location: Sacrum  ?Location Orientation: Mid;Lower  ?Staging: Stage 2 -  Partial thickness loss of dermis presenting as a shallow open injury with a red, pink wound bed without slough.  ?Wound Description (Comments): denuded area between buttock on sacrum-pink and peeling  ?Present on Admission: No  ? ? ?Code Status: Full CODE STATUS ?DVT Prophylaxis:  SCDs Start: 05/24/21  2214 ? ? ?Level of Care: Level of care: Telemetry Medical ?Family Communication: Updated patient's son on the phone ? ?Disposition Plan:     Remains inpatient appropriate: GI bleed, work-up in progress ? ?Procedures:  ?EGD ? ?Consultants:   ?Gastroenterology ? ?Antimicrobials:  ?None ? ? ?Medications ? ? sodium chloride   Intravenous Once  ? atorvastatin  40 mg Oral QPM  ? hydrocortisone sod succinate (SOLU-CORTEF) inj  50 mg Intravenous Q8H  ? insulin aspart  0-5 Units Subcutaneous QHS  ? insulin aspart  0-9 Units Subcutaneous TID WC  ? levothyroxine  125 mcg Oral QAC breakfast  ? metoCLOPramide (REGLAN) injection  5 mg Intravenous TID AC  ? midodrine  5 mg Oral TID WC  ? nystatin   Topical BID  ? pantoprazole  40 mg Oral BID  ? polyethylene glycol  17 g Oral Daily  ? ? ? ?Subjective:  ? ?Tammy Good was seen and examined today.  Still feeling weak, nauseated no active bleeding.  No abdominal pain, no fevers chills, no chest pain or shortness of breath ? ?Objective:  ? ?Vitals:  ? 05/31/21 0400 05/31/21 0739 05/31/21 0747 05/31/21 0833  ?BP: (!) 93/55 (!) 82/40 (!) 108/49 (!) 103/56  ?Pulse: 95 (!) 104  (!) 113  ?Resp: '17 20  16  '$ ?Temp:  99.1 ?F (37.3 ?C)  98.6 ?F (37 ?C)  ?TempSrc:  Oral  Oral  ?SpO2: 95% 95%    ?Weight:      ?Height:      ? ? ?Intake/Output Summary (Last 24 hours) at 05/31/2021 1520 ?Last data filed at 05/31/2021 1036 ?Gross per 24 hour  ?Intake 4346.34 ml  ?Output 2550 ml  ?Net 1796.34 ml  ? ?Filed Weights  ? 05/24/21 1713 05/25/21 1330  ?Weight: 99.8 kg 96.5 kg  ? ? ?Physical Exam ?General: Alert and oriented x 3, NAD ?Cardiovascular: S1 S2 clear, RRR. No pedal edema b/l ?Respiratory: CTAB, no wheezing, rales or rhonchi ?Gastrointestinal: Soft, nontender, nondistended, NBS ?Ext: no pedal edema bilaterally ?Neuro: no new deficits ?Psych: Normal affect and demeanor, alert and oriented x3  ? ?Data Reviewed:  I have personally reviewed following labs  ? ? ?CBC ?Lab Results  ?Component Value Date   ? WBC 7.3 05/31/2021  ? RBC 3.32 (L) 05/31/2021  ? HGB 9.4 (L) 05/31/2021  ? HCT 29.9 (L) 05/31/2021  ? MCV 90.1 05/31/2021  ? MCH 28.3 05/31/2021  ? PLT 312 05/31/2021  ? MCHC 31.4 05/31/2021  ? RDW 18.8 (H) 05/31/2021  ? LYMPHSABS 1.7 05/28/2021  ? MONOABS 0.7 05/28/2021  ? EOSABS 0.5 05/28/2021  ? BASOSABS 0.0 05/28/2021  ? ? ? ?Last metabolic panel ?Lab Results  ?Component Value Date  ? NA 140 05/30/2021  ? K 3.3 (L) 05/30/2021  ? CL 106 05/30/2021  ? CO2 25 05/30/2021  ? BUN <5 (L) 05/30/2021  ? CREATININE 1.03 (H)  05/30/2021  ? GLUCOSE 121 (H) 05/30/2021  ? GFRNONAA 59 (L) 05/30/2021  ? GFRAA 21 (L) 06/23/2019  ? CALCIUM 7.7 (L) 05/30/2021  ? PHOS 2.3 (L) 05/26/2021  ? PROT 5.4 (L) 05/26/2021  ? ALBUMIN 2.5 (L) 05/26/2021  ? BILITOT 1.1 05/26/2021  ? ALKPHOS 72 05/26/2021  ? AST 18 05/26/2021  ? ALT 12 05/26/2021  ? ANIONGAP 9 05/30/2021  ? ? ?CBG (last 3)  ?Recent Labs  ?  05/30/21 ?1554 05/30/21 ?2136 05/31/21 ?0735  ?GLUCAP 110* 126* 129*  ?  ? ?Radiology Studies: I have personally reviewed the imaging studies  ?NM GI Blood Loss ? ?Result Date: 05/31/2021 ?CLINICAL DATA:  GI bleeding EXAM: NUCLEAR MEDICINE GASTROINTESTINAL BLEEDING SCAN TECHNIQUE: Sequential abdominal images were obtained following intravenous administration of Tc-30mlabeled red blood cells. RADIOPHARMACEUTICALS:  23.2 mCi Tc-973mertechnetate in-vitro labeled red cells. COMPARISON:  CT done on 05/27/2021 FINDINGS: There is no demonstrable extravasation of tracer during 2 hours of observation. IMPRESSION: There is no demonstrable focal extravasation of tracer in the visualized portions of abdomen and pelvis. Electronically Signed   By: PaElmer Picker.D.   On: 05/31/2021 14:42   ? ? ? ? ?RiEstill Cotta.D. ?[facility] ?05/31/2021, 3:20 PM ? ?Available via Epic secure chat 7am-7pm ?After 7 pm, please refer to night coverage provider listed on amion. ? ?  ?

## 2021-05-31 NOTE — Plan of Care (Signed)
Pt has a sm pink area on the coccyx ([place] applied) and sloughing yeast-like skin bilat groin, under bilat breasts and inside butt cheeks. Protective barrier cream applied. Pt c/o low back pain which is chronic but refused Narcotic. Given Tylenol and hot pack which were effective. Pt sat in chair yesterday. Pt received 2 units of PRBC this shift. VS stable. No anxiety noted. Pt was on RA but when asleep sats dropped below 90% so placed on Oxygen 1 L/M [place]. Pt with multifocal PVCs rare/occasional and NSR. No c/o CP or dizziness. Pt had emesis this am and was given Zofran. Pt hasn't attempted to get out of bed without assist. Bed Alarm set. Pt with a guaze and Ace Wrap to the left ankle wound. Wound Consult order written tonight.  ?

## 2021-05-31 NOTE — Progress Notes (Signed)
PT Cancellation Note ? ?Patient Details ?Name: Tammy Good ?MRN: [id number] ?DOB: Jan 26, 1951 ? ? ?Cancelled Treatment:    Reason Eval/Treat Not Completed: Medical issues which prohibited therapy. Patient continuing to have nausea and vomiting. Will check back later today if time allows.  ? ? ?[name] ?05/31/2021, 12:12 PM ?

## 2021-05-31 NOTE — Progress Notes (Signed)
OT Cancellation Note ? ?Patient Details ?Name: Tammy Good ?MRN: [id number] ?DOB: 10-27-1950 ? ? ?Cancelled Treatment:    Reason Eval/Treat Not Completed: Patient at procedure or test/ unavailable (patient off unit for testing procedure) ?Lodema Hong, OTA ?Acute Rehabilitation Services  ?Pager [phone] ?Office [phone] ? ?[place] ?05/31/2021, 1:51 PM ?

## 2021-05-31 NOTE — Consult Note (Signed)
[facility] Nurse Consult Note: ?Reason for Consult: Patient with left surgical site and staff are requesting guidance for wound care.  Patient is s/p left ankle incision and drainage, bone biopsy, wound debridement preformed on 04/24/2021 with Dr. Cannon Kettle (podiatric medicine) and has been seen in follow up by [name] x2 since that date. She has been seen by [facility] once weekly with her daughter and granddaughter performing wound care between [facility] visits. Patient had an appointment with Podiatry this week that she missed due to hospitalization. ?Wound type:surgical ?Pressure Injury POA:N/A ?Measurement: 17cm incision with 16 sutures that is well approximated except for the most proximal portion measuring 2.5cm x 0.6cm x 0.8cm. There is undermining at 12 and 6 o'clock measuring 0.5cm. 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 Leigh Aurora office to reschedule her appointment post discharge. ? ?The last note from Podiatry indicates that proximal end is packed with Endoform, a cellular matrix dressing. As we do not carry that in house, I will use a size appropriate piece of silver alginate to the wound topped with dry gauze and secured with Kerlix roll gauze and ACE bandage. Changes will be daily. ? ?[facility] nursing team will not follow, but will remain available to this patient, the nursing and medical teams.  Please re-consult if needed. ?Thanks, ?Maudie Flakes, MSN, RN, [place], [place], CWON-AP, [place]  ?Pager# [phone]  ? ? ? ?  ?

## 2021-05-31 NOTE — Progress Notes (Addendum)
Subjective: ?No acute events.  She feels nauseated. ? ?Objective: ?Vital signs in last 24 hours: ?Temp:  [98 ?F (36.7 ?C)-99.1 ?F (37.3 ?C)] 98.7 ?F (37.1 ?C) (03/10 0324) ?Pulse Rate:  [95-116] 95 (03/10 0400) ?Resp:  [14-20] 17 (03/10 0400) ?BP: (93-131)/(50-65) 93/55 (03/10 0400) ?SpO2:  [91 %-99 %] 95 % (03/10 0400) ?Last BM Date : 05/30/21 ? ?Intake/Output from previous day: ?03/09 0701 - 03/10 0700 ?In: 1303 [P.O.:660; Blood:643] ?Out: 2200 [Urine:2200] ?Intake/Output this shift: ?No intake/output data recorded. ? ?General appearance: alert and no distress ?GI: soft, non-tender; bowel sounds normal; no masses,  no organomegaly ? ?Lab Results: ?Recent Labs  ?  05/30/21 ?1548 05/31/21 ?0432  ?WBC 8.8 7.3  ?HGB 7.6* 9.4*  ?HCT 26.1* 29.9*  ?PLT 284 312  ? ?BMET ?Recent Labs  ?  05/30/21 ?1548  ?NA 140  ?K 3.3*  ?CL 106  ?CO2 25  ?GLUCOSE 121*  ?BUN <5*  ?CREATININE 1.03*  ?CALCIUM 7.7*  ? ?LFT ?No results for input(s): PROT, ALBUMIN, AST, ALT, ALKPHOS, BILITOT, BILIDIR, IBILI in the last 72 hours. ?PT/INR ?No results for input(s): LABPROT, INR in the last 72 hours. ?Hepatitis Panel ?No results for input(s): HEPBSAG, HCVAB, HEPAIGM, HEPBIGM in the last 72 hours. ?C-Diff ?No results for input(s): CDIFFTOX in the last 72 hours. ?Fecal Lactopherrin ?No results for input(s): FECLLACTOFRN in the last 72 hours. ? ?Studies/Results: ?No results found. ? ?Medications: Scheduled: ? sodium chloride   Intravenous Once  ? atorvastatin  40 mg Oral QPM  ? insulin aspart  0-5 Units Subcutaneous QHS  ? insulin aspart  0-9 Units Subcutaneous TID WC  ? levothyroxine  125 mcg Oral QAC breakfast  ? metoCLOPramide (REGLAN) injection  5 mg Intravenous TID AC  ? midodrine  5 mg Oral TID WC  ? pantoprazole  40 mg Oral BID  ? polyethylene glycol  17 g Oral Daily  ? ?Continuous: ? promethazine (PHENERGAN) injection (IM or IVPB) 12.5 mg (05/29/21 2232)  ? ? ?Assessment/Plan: ?1) Anemia. ?2) Recent finding of a nonbleeding esophageal  ulcer. ?3) Afib. ? ? The patient's HGB trended downwards since the initial blood transfusion.  She denies any overt evidence of bleeding with hematemesis, melena, or hematochezia.  She responded well to the two units of PRBC.  Her last colonoscopy was a little over two years ago and it was performed for screening purposes.  A small ascending colon adenoma was removed. ? ?Plan: ?1) Monitor HGB. ?2) Transfuse as necessary. ?3) RBC scan.  If negative, and she continues to drop her HGB, a VCE can be performed. ?4)  [facility] will be covering this weekend. ? LOS: 7 days  ? ?Teena Mangus D ?05/31/2021, 7:21 AM  ?

## 2021-06-01 ENCOUNTER — Encounter (HOSPITAL_COMMUNITY): Payer: Self-pay | Admitting: Internal Medicine

## 2021-06-01 DIAGNOSIS — K254 Chronic or unspecified gastric ulcer with hemorrhage: Secondary | ICD-10-CM

## 2021-06-01 DIAGNOSIS — D649 Anemia, unspecified: Secondary | ICD-10-CM | POA: Diagnosis not present

## 2021-06-01 DIAGNOSIS — E1100 Type 2 diabetes mellitus with hyperosmolarity without nonketotic hyperglycemic-hyperosmolar coma (NKHHC): Secondary | ICD-10-CM | POA: Diagnosis not present

## 2021-06-01 DIAGNOSIS — N179 Acute kidney failure, unspecified: Secondary | ICD-10-CM | POA: Diagnosis not present

## 2021-06-01 DIAGNOSIS — I10 Essential (primary) hypertension: Secondary | ICD-10-CM | POA: Diagnosis not present

## 2021-06-01 LAB — CBC
HCT: 31.2 % — ABNORMAL LOW (ref 36.0–46.0)
Hemoglobin: 9.7 g/dL — ABNORMAL LOW (ref 12.0–15.0)
MCH: 27.6 pg (ref 26.0–34.0)
MCHC: 31.1 g/dL (ref 30.0–36.0)
MCV: 88.6 fL (ref 80.0–100.0)
Platelets: 447 10*3/uL — ABNORMAL HIGH (ref 150–400)
RBC: 3.52 MIL/uL — ABNORMAL LOW (ref 3.87–5.11)
RDW: 19.1 % — ABNORMAL HIGH (ref 11.5–15.5)
WBC: 9.5 10*3/uL (ref 4.0–10.5)
nRBC: 0 % (ref 0.0–0.2)

## 2021-06-01 LAB — GLUCOSE, CAPILLARY
Glucose-Capillary: 169 mg/dL — ABNORMAL HIGH (ref 70–99)
Glucose-Capillary: 235 mg/dL — ABNORMAL HIGH (ref 70–99)
Glucose-Capillary: 216 mg/dL — ABNORMAL HIGH (ref 70–99)
Glucose-Capillary: 259 mg/dL — ABNORMAL HIGH (ref 70–99)

## 2021-06-01 LAB — BASIC METABOLIC PANEL
Anion gap: 10 (ref 5–15)
BUN: <5 mg/dL — ABNORMAL LOW (ref 8–23)
CO2: 22 mmol/L (ref 22–32)
Calcium: 7.7 mg/dL — ABNORMAL LOW (ref 8.9–10.3)
Chloride: 106 mmol/L (ref 98–111)
Creatinine, Ser: 1.13 mg/dL — ABNORMAL HIGH (ref 0.44–1.00)
GFR, Estimated: 53 mL/min — ABNORMAL LOW (ref >60)
Glucose, Bld: 175 mg/dL — ABNORMAL HIGH (ref 70–99)
Potassium: 4.1 mmol/L (ref 3.5–5.1)
Sodium: 138 mmol/L (ref 135–145)

## 2021-06-01 MED ORDER — HYDROCORTISONE SOD SUC (PF) 100 MG IJ SOLR
25.0000 mg | Freq: Three times a day (TID) | INTRAMUSCULAR | Status: DC
Start: 2021-06-01 — End: 2021-06-02
  Administered 2021-06-01 – 2021-06-02 (×3): 25 mg via INTRAVENOUS
  Filled 2021-06-01 (×3): qty 2

## 2021-06-01 MED ORDER — POLYVINYL ALCOHOL 1.4 % OP SOLN
1.0000 [drp] | OPHTHALMIC | Status: DC | PRN
  Administered 2021-06-01 – 2021-06-02 (×3): 1 [drp] via OPHTHALMIC
  Filled 2021-06-01 (×2): qty 15

## 2021-06-01 MED ORDER — APIXABAN 5 MG PO TABS
5.0000 mg | ORAL_TABLET | Freq: Two times a day (BID) | ORAL | Status: DC
  Administered 2021-06-01 – 2021-06-03 (×5): 5 mg via ORAL
  Filled 2021-06-01 (×5): qty 1

## 2021-06-01 NOTE — Progress Notes (Signed)
[facility]                                                                               Tammy Good, is a 71 y.o. female, DOB - Sep 24, 1950, [place] date - 05/24/2021    Outpatient Primary MD for the patient is [name], MD  LOS - 8  days    Brief summary   71 year old female with history of left ankle infection, MRSA bacteremia and tenosynovitis of the left ankle status post I&D, essential hypertension, hyperlipidemia, type 2 diabetes, chronic iron deficiency anemia with baseline hemoglobin about 8, hypothyroidism, paroxysmal A-fib on Eliquis, GERD, coronary artery disease status post PCI with stent presented from home with gradually worsening generalized weakness for 2 days, poor appetite and ongoing chronic nausea.  At the emergency room blood pressure stable.  Hemoglobin 5.6, creatinine 1.89 from baseline 0.78. GI was consulted.  Underwent EGD.   Assessment & Plan    Assessment and Plan: * Symptomatic anemia - Presented with acute blood loss anemia, has history of chronic iron deficiency anemia in the setting of eliquis and Plavix -Presented with hemoglobin of 5.6, received 2 units packed RBCs.  Placed on IV PPI. -GI was consulted, upper endoscopy 3/5 with esophageal ulcer, no active bleeding. -Hemoglobin continued to trend down, no obvious bleeding, hemoglobin trended down to 7.6 on 3/9, transfused 2 units packed RBCs  -GI reconsulted, tagged RBC scan negative.  H&H stable. -Discussed with vascular surgery, [name] on 3/10, patient did not have stent placed in 03/2021 after abdominal aortogram hence she can come off Plavix, can continue aspirin 81 mg daily.   -Discussed with cardiology, Dr. Caryl Comes regarding Eliquis (for paroxysmal A-fib) (patient follows Dr. Ellyn Hack), given CHA2DS2-VASc score 6 (HTN, diabetes, vascular disease, CHF, age/f), recommended resuming Eliquis.  If patient bleeds or hemoglobin drops down with eliquis, then discontinue AC,  patient will then meet criteria for Watchman procedure and then be placed on aspirin 81.   - will place on Eliquis, monitor H&H overnight -Follow FOBT today.     GI bleed -  EGD 3/5 showed esophageal ulcer, no active bleeding -Continue Protonix 40 mg twice daily -Status post 2 units packed RBCs on 3/9, -Tagged RBC scan negative.  Hemoglobin stable 9.7.  No obvious bleeding -Resuming Eliquis today, monitor H&H overnight  Liver lesion - Patient had recent MRSA infection treated with 2 weeks of vancomycin, intolerance to Zyvox. -CT of the abdomen showed 2x 2cm heterogeneous mass in the liver.  Left ankle clinically improving -MRI of the liver consistent with benign cyst or hemangioma  AKI (acute kidney injury) ([place]) - Presented with creatinine of 1.12, BUN 26 -Resolved  Essential hypertension - Hypotensive and orthostatic, plasma cortisol level low random 2.9, repeated 3.4 in am - Continue midodrine, cannot add Florinef due to CHF. -B12, folate normal -BP now normal, taper Solu-Cortef, will change to p.o. in a.m.   Hypokalemia - Replaced p.o. x1 on 3/9  Generalized weakness - PT OT evaluation  PAD (peripheral artery disease) ([place]) - She had aortic stent placed within a year, on Plavix and Eliquis.  Outpatient follow-up with cardiology and vascular surgery. -Please see above regarding  eliquis and Plavix  Diabetes mellitus, type 2 ([place]) -Hemoglobin A1c 7.2 on 04/23/2021 -CBGs fairly stable, tapering Solu-Cortef, continue sliding scale insulin  Nausea - Nausea improving, continue soft diet -Continue antiemetics as needed    Pressure Injury Documentation: Pressure Injury 05/31/21 Sacrum Mid;Lower Stage 2 -  Partial thickness loss of dermis presenting as a shallow open injury with a red, pink wound bed without slough. denuded area between buttock on sacrum-pink and peeling (Active)  05/31/21 0300  Location: Sacrum  Location Orientation: Mid;Lower  Staging: Stage 2 -   Partial thickness loss of dermis presenting as a shallow open injury with a red, pink wound bed without slough.  Wound Description (Comments): denuded area between buttock on sacrum-pink and peeling  Present on Admission: No   Obesity Estimated body mass index is 33.32 kg/m as calculated from the following:   Height as of this encounter: '5\' 7"'$  (1.702 m).   Weight as of this encounter: 96.5 kg.  Code Status: Full CODE STATUS DVT Prophylaxis:  SCDs Start: 05/24/21 2214 apixaban (ELIQUIS) tablet 5 mg   Level of Care: Level of care: Telemetry Medical Family Communication: Updated patient's son on 3/10  Disposition Plan:     Remains inpatient appropriate: Starting eliquis today, monitor H&H, hopefully DC home in a.m.  Procedures:  EGD  Consultants:   Gastroenterology Discussed with cardiology and vascular surgery  Antimicrobials:   None  Medications   sodium chloride   Intravenous Once   apixaban  5 mg Oral BID   atorvastatin  40 mg Oral QPM   hydrocortisone sod succinate (SOLU-CORTEF) inj  25 mg Intravenous Q8H   insulin aspart  0-5 Units Subcutaneous QHS   insulin aspart  0-9 Units Subcutaneous TID WC   levothyroxine  125 mcg Oral QAC breakfast   metoCLOPramide (REGLAN) injection  5 mg Intravenous TID AC   midodrine  5 mg Oral TID WC   nystatin   Topical BID   pantoprazole  40 mg Oral BID   polyethylene glycol  17 g Oral Daily     Subjective:   Tammy Good was seen and examined today.  No overnight bleeding.  Nausea and vomiting improving, diet advanced to soft solids. Patient denies dizziness, chest pain, shortness of breath, abdominal pain, new weakness, numbess, tingling. No acute events overnight.    Objective:   Vitals:   05/31/21 1927 05/31/21 2353 06/01/21 0300 06/01/21 0900  BP: 129/62 (!) 105/52 124/80 123/61  Pulse: (!) 110 97 94 94  Resp: '20 18 20 18  '$ Temp: 99 F (37.2 C) 99.5 F (37.5 C) 98.5 F (36.9 C) 98.4 F (36.9 C)  TempSrc: Oral Oral  Oral Oral  SpO2: (!) 89% 92% 94% 93%  Weight:      Height:        Intake/Output Summary (Last 24 hours) at 06/01/2021 1200 Last data filed at 06/01/2021 0521 Gross per 24 hour  Intake --  Output 700 ml  Net -700 ml   Filed Weights   05/24/21 1713 05/25/21 1330  Weight: 99.8 kg 96.5 kg   Physical Exam General: Alert and oriented x 3, NAD Cardiovascular: S1 S2 clear, RRR. No pedal edema b/l Respiratory: CTAB, no wheezing, rales or rhonchi Gastrointestinal: Soft, nontender, nondistended, NBS Ext: no pedal edema bilaterally Neuro: no new deficits   Data Reviewed:  I have personally reviewed following labs    CBC Lab Results  Component Value Date   WBC 9.5 06/01/2021   RBC 3.52 (L) 06/01/2021  HGB 9.7 (L) 06/01/2021   HCT 31.2 (L) 06/01/2021   MCV 88.6 06/01/2021   MCH 27.6 06/01/2021   PLT 447 (H) 06/01/2021   MCHC 31.1 06/01/2021   RDW 19.1 (H) 06/01/2021   LYMPHSABS 1.7 05/28/2021   MONOABS 0.7 05/28/2021   EOSABS 0.5 05/28/2021   BASOSABS 0.0 09/47/0962     Last metabolic panel Lab Results  Component Value Date   NA 138 06/01/2021   K 4.1 06/01/2021   CL 106 06/01/2021   CO2 22 06/01/2021   BUN <5 (L) 06/01/2021   CREATININE 1.13 (H) 06/01/2021   GLUCOSE 175 (H) 06/01/2021   GFRNONAA 53 (L) 06/01/2021   GFRAA 21 (L) 06/23/2019   CALCIUM 7.7 (L) 06/01/2021   PHOS 2.3 (L) 05/26/2021   PROT 5.4 (L) 05/26/2021   ALBUMIN 2.5 (L) 05/26/2021   BILITOT 1.1 05/26/2021   ALKPHOS 72 05/26/2021   AST 18 05/26/2021   ALT 12 05/26/2021   ANIONGAP 10 06/01/2021    CBG (last 3)  Recent Labs    05/31/21 1604 05/31/21 2039 06/01/21 0807  GLUCAP 127* 164* 169*        Radiology Studies: I have personally reviewed the imaging studies  NM GI Blood Loss  Result Date: 05/31/2021 CLINICAL DATA:  GI bleeding EXAM: NUCLEAR MEDICINE GASTROINTESTINAL BLEEDING SCAN TECHNIQUE: Sequential abdominal images were obtained following intravenous administration of Tc-36m labeled red blood cells. RADIOPHARMACEUTICALS:  23.2 mCi Tc-960mertechnetate in-vitro labeled red cells. COMPARISON:  CT done on 05/27/2021 FINDINGS: There is no demonstrable extravasation of tracer during 2 hours of observation. IMPRESSION: There is no demonstrable focal extravasation of tracer in the visualized portions of abdomen and pelvis. Electronically Signed   By: PaElmer Picker.D.   On: 05/31/2021 14:42       Joyel Chenette M.D. [facility] 06/01/2021, 12:00 PM  Available via Epic secure chat 7am-7pm After 7 pm, please refer to night coverage provider listed on amion.

## 2021-06-01 NOTE — Progress Notes (Addendum)
? ? ? ?  [facility] Progress Note ? ?CC:  Anemia ? ?Assessment / Plan: ?Acute on chronic symptomatic anemia due to suspected GI blood loss in the setting of Eliquis and Plavix, now on hold. However, there has been no overt bleeding. Acute change in anemia initially thought to be due to esophageal ulcer. Hemoglobin continued to decline without overt bleeding. Required 2 units of PRBCs on 05/30/21.  Subsequent bleeding scan was negative for active bleed 05/31/21. Consider CTA with any additional overt bleeding. Continue serial hgb/hct in the meantime. Other concurrent causes of anemia should be considered.  ? ?Distal esophageal ulcer on EGD 05/26/21. No active bleeding but the performing endoscopy felt this was the source of recent bleeding. She has no associated symptoms. On PPI BID.  ? ?Liver mass on CT. MRI suggests benign lesion such as cyst or hemangioma in addition to smaller cyst in the left lobe.  ? ?History of colon polyp: Per Dr. Benson Norway, she had a small ascending colon tubular adenoma on screening colonoscopy 2020. Procedure note not available in Epic for my review.  ? ?I am covering for Dr. Benson Norway who will be back 06/03/21.  ? ?Subjective: ?Continues to feel week and has some nausea. No overt bleeding. No hematemesis, dysphagia, odynophagia, reflux, brash, dysphonia, or atypical chest pain.  ? ?Objective:  ?Vital signs in last 24 hours: ?Temp:  [97.9 ?F (36.6 ?C)-99.5 ?F (37.5 ?C)] 98.4 ?F (36.9 ?C) (03/11 0900) ?Pulse Rate:  [94-110] 94 (03/11 0900) ?Resp:  [18-20] 18 (03/11 0900) ?BP: (104-129)/(52-80) 123/61 (03/11 0900) ?SpO2:  [89 %-94 %] 93 % (03/11 0900) ?Last BM Date : 05/30/21 ?General:   Alert, in NAD ?Heart:  Regular rate and rhythm; no murmurs ?Pulm: Clear anteriorly; no wheezing ?Abdomen:  Soft. Nontender. Nondistended. Normal bowel sounds. No rebound or guarding. ?LAD: No inguinal or umbilical LAD ?Extremities:  Without edema. ?Neurologic:  Alert and  oriented x4;  grossly normal  neurologically. ?Psych:  Alert and cooperative. Normal mood and affect. ? ? ?Lab Results: ?Recent Labs  ?  05/30/21 ?1548 05/31/21 ?0432 06/01/21 ?0645  ?WBC 8.8 7.3 9.5  ?HGB 7.6* 9.4* 9.7*  ?HCT 26.1* 29.9* 31.2*  ?PLT 284 312 447*  ? ?BMET ?Recent Labs  ?  05/30/21 ?1548 06/01/21 ?0645  ?NA 140 138  ?K 3.3* 4.1  ?CL 106 106  ?CO2 25 22  ?GLUCOSE 121* 175*  ?BUN <5* <5*  ?CREATININE 1.03* 1.13*  ?CALCIUM 7.7* 7.7*  ? ? ? ?NM GI Blood Loss ? ?Result Date: 05/31/2021 ?CLINICAL DATA:  GI bleeding EXAM: NUCLEAR MEDICINE GASTROINTESTINAL BLEEDING SCAN TECHNIQUE: Sequential abdominal images were obtained following intravenous administration of Tc-52mlabeled red blood cells. RADIOPHARMACEUTICALS:  23.2 mCi Tc-9103mertechnetate in-vitro labeled red cells. COMPARISON:  CT done on 05/27/2021 FINDINGS: There is no demonstrable extravasation of tracer during 2 hours of observation. IMPRESSION: There is no demonstrable focal extravasation of tracer in the visualized portions of abdomen and pelvis. Electronically Signed   By: PaElmer Picker.D.   On: 05/31/2021 14:42   ? ? ? ? LOS: 8 days  ? ?[place]orn[place] Park3/01/2022, 9:42 AM ? ?  ?

## 2021-06-01 NOTE — Progress Notes (Signed)
Occupational Therapy Treatment ?Patient Details ?Name: Tammy Good ?MRN: [id number] ?DOB: 10/19/1950 ?Today's Date: 06/01/2021 ? ? ?History of present illness 71 y.o. female who presented from home via EMS 05/24/21 with complaints of gradually worsening generalized weakness and nausea with poor intake. Hgb 5.6 (8.5 after 2 units of PRBC), acute renal insufficiency, hypovolemic, V/Q scan negative for PE   PMH significant for left ankle infection, MRSA bacteremia (04/22/21) tenosynovitis of left ankle status post I&D, hypertension, hyperlipidemia, type 2 diabetes, anemia, paroxysmal A-fib on Eliquis, coronary artery disease status post PCI with stent, obesity ?  ?OT comments ? Pt received awake and alert, eager to engage in therapy session. Pt is progressing towards established goals, however, still limited with activity tolerance, weakness, and functional stability. During session, pt demonstrated mod I bed mobility for increased time, mod I seated LB dressing, CGA sit to stand with RW, and CGA lateral steps EOB with cues for safe hand placement and sequencing for safe and controlled sitting. Pt on RA with VSS. Pt will benefit to continue skilled level OT to ensure safe transition prior to session. DC recommendation and POC remains the same.   ? ?Recommendations for follow up therapy are one component of a multi-disciplinary discharge planning process, led by the attending physician.  Recommendations may be updated based on patient status, additional functional criteria and insurance authorization. ?   ?Follow Up Recommendations ? No OT follow up  ?  ?Assistance Recommended at Discharge PRN  ?Patient can return home with the following ? A little help with walking and/or transfers;A little help with bathing/dressing/bathroom ?  ?Equipment Recommendations ? None recommended by OT  ?  ?Recommendations for Other Services   ? ?  ?Precautions / Restrictions Precautions ?Precautions: Fall ?Precaution Comments: reports one  fall ?Restrictions ?Weight Bearing Restrictions: No  ? ? ?  ? ?Mobility Bed Mobility ?Overal bed mobility: Needs Assistance ?Bed Mobility: Supine to Sit, Sit to Supine ?  ?  ?Supine to sit: Modified independent (Device/Increase time) ?Sit to supine: Modified independent (Device/Increase time) ?  ?General bed mobility comments: Mod I for increased time. ?  ? ?Transfers ?Overall transfer level: Needs assistance ?Equipment used: 1 person hand held assist, Rolling walker (2 wheels) ?Transfers: Sit to/from Stand ?Sit to Stand: Min guard ?  ?  ?  ?  ?  ?General transfer comment: cues for safe hand placement to reach for seated surface prior to sitting and pushing up to stand from seated surface. ?  ?  ?Balance Overall balance assessment: Needs assistance ?Sitting-balance support: No upper extremity supported, Feet supported ?Sitting balance-Leahy Scale: Good ?  ?  ?Standing balance support: Single extremity supported, During functional activity ?Standing balance-Leahy Scale: Poor ?Standing balance comment: Stood EOB ?  ?  ?  ?  ?  ?  ?  ?  ?  ?  ?  ?   ? ?ADL either performed or assessed with clinical judgement  ? ?ADL Overall ADL's : Modified independent;At baseline ?  ?  ?  ?General ADL Comments: demonstrated figure position to don L sock. ?  ? ?Extremity/Trunk Assessment Upper Extremity Assessment ?Upper Extremity Assessment: Overall WFL for tasks assessed ?  ?Lower Extremity Assessment ?Lower Extremity Assessment: Defer to PT evaluation ?  ?  ?  ? ?Vision   ?Vision Assessment?: No apparent visual deficits ?  ?Perception   ?  ?Praxis   ?  ? ?Cognition Arousal/Alertness: Awake/alert ?Behavior During Therapy: [facility] for tasks assessed/performed ?Overall Cognitive Status: Within Functional  Limits for tasks assessed ?  ?  ?  ?  ?General Comments: alert and eager to get mobile ?  ?  ?   ?Exercises   ? ?  ?Shoulder Instructions   ? ? ?  ?General Comments RA 93% SpO2, HR 94-101 bpm. BP levels 124/62 supine, 132/69 sitting,  116/85 no c/o dizziness or light headedness.  ? ? ?Pertinent Vitals/ Pain       Pain Assessment ?Pain Assessment: No/denies pain ? ?Home Living   ?  ? ?  ?Prior Functioning/Environment    ?  ?  ?  ?   ? ?Frequency ? Min 2X/week  ? ? ? ? ?  ?Progress Toward Goals ? ?OT Goals(current goals can now be found in the care plan section) ? Progress towards OT goals: Progressing toward goals ? ?Acute Rehab OT Goals ?OT Goal Formulation: With patient ?Time For Goal Achievement: 06/09/21 ?Potential to Achieve Goals: Good ?ADL Goals ?Pt Will Transfer to Toilet: with modified independence;ambulating ?Pt Will Perform Toileting - Clothing Manipulation and hygiene: with modified independence;sitting/lateral leans;sit to/from stand ?Additional ADL Goal #1: Pt will report 3 fall prevention techniques that she can use at home  ?Plan Discharge plan remains appropriate;Frequency remains appropriate   ? ?Co-evaluation ? ? ?   ?  ?  ?  ?  ? ?  ?AM-PAC OT "6 Clicks" Daily Activity     ?Outcome Measure ? ? Help from another person eating meals?: None ?Help from another person taking care of personal grooming?: None ?Help from another person toileting, which includes using toliet, bedpan, or urinal?: None ?Help from another person bathing (including washing, rinsing, drying)?: None ?Help from another person to put on and taking off regular upper body clothing?: None ?Help from another person to put on and taking off regular lower body clothing?: None ?6 Click Score: 24 ? ?  ?End of Session Equipment Utilized During Treatment: Rolling walker (2 wheels) ? ?OT Visit Diagnosis: Unsteadiness on feet (R26.81);Other abnormalities of gait and mobility (R26.89);Muscle weakness (generalized) (M62.81) ?  ?Activity Tolerance Patient tolerated treatment well ?  ?Patient Left with call bell/phone within reach;in bed;with bed alarm set;with nursing/sitter in room (prevalon boot) ?  ?Nurse Communication Mobility status;Other (comment) (pt requesting eye  cream due to swellling and burning sensation. Addressed with RN.) ?  ? ?   ? ?Time: 9767-3419 ?OT Time Calculation (min): 31 min ? ?Charges: OT General Charges ?$OT Visit: 1 Visit ?OT Treatments ?$Self Care/Home Management : 23-37 mins ? ?Minus Breeding, MSOT, OTR/L ? ?Supplemental Rehabilitation Services ? ?[phone] ? ? ?Marius Ditch ?06/01/2021, 3:24 PM ? ? ?

## 2021-06-02 ENCOUNTER — Inpatient Hospital Stay (HOSPITAL_COMMUNITY): Payer: Medicare Other

## 2021-06-02 DIAGNOSIS — N179 Acute kidney failure, unspecified: Secondary | ICD-10-CM | POA: Diagnosis not present

## 2021-06-02 DIAGNOSIS — D649 Anemia, unspecified: Secondary | ICD-10-CM | POA: Diagnosis not present

## 2021-06-02 DIAGNOSIS — I5022 Chronic systolic (congestive) heart failure: Secondary | ICD-10-CM

## 2021-06-02 DIAGNOSIS — I5023 Acute on chronic systolic (congestive) heart failure: Secondary | ICD-10-CM

## 2021-06-02 DIAGNOSIS — K2210 Ulcer of esophagus without bleeding: Secondary | ICD-10-CM | POA: Diagnosis not present

## 2021-06-02 DIAGNOSIS — E1100 Type 2 diabetes mellitus with hyperosmolarity without nonketotic hyperglycemic-hyperosmolar coma (NKHHC): Secondary | ICD-10-CM | POA: Diagnosis not present

## 2021-06-02 DIAGNOSIS — E039 Hypothyroidism, unspecified: Secondary | ICD-10-CM

## 2021-06-02 LAB — CULTURE, BLOOD (ROUTINE X 2)
Culture: NO GROWTH
Culture: NO GROWTH
Special Requests: ADEQUATE
Special Requests: ADEQUATE

## 2021-06-02 LAB — CBC
HCT: 30.7 % — ABNORMAL LOW (ref 36.0–46.0)
Hemoglobin: 9.7 g/dL — ABNORMAL LOW (ref 12.0–15.0)
MCH: 27.6 pg (ref 26.0–34.0)
MCHC: 31.6 g/dL (ref 30.0–36.0)
MCV: 87.2 fL (ref 80.0–100.0)
Platelets: 509 10*3/uL — ABNORMAL HIGH (ref 150–400)
RBC: 3.52 MIL/uL — ABNORMAL LOW (ref 3.87–5.11)
RDW: 18.7 % — ABNORMAL HIGH (ref 11.5–15.5)
WBC: 8.1 10*3/uL (ref 4.0–10.5)
nRBC: 0 % (ref 0.0–0.2)

## 2021-06-02 LAB — GLUCOSE, CAPILLARY
Glucose-Capillary: 213 mg/dL — ABNORMAL HIGH (ref 70–99)
Glucose-Capillary: 263 mg/dL — ABNORMAL HIGH (ref 70–99)
Glucose-Capillary: 307 mg/dL — ABNORMAL HIGH (ref 70–99)
Glucose-Capillary: 294 mg/dL — ABNORMAL HIGH (ref 70–99)
Glucose-Capillary: 292 mg/dL — ABNORMAL HIGH (ref 70–99)

## 2021-06-02 LAB — BRAIN NATRIURETIC PEPTIDE: B Natriuretic Peptide: 1007.5 pg/mL — ABNORMAL HIGH (ref 0.0–100.0)

## 2021-06-02 MED ORDER — HYDROCORTISONE SOD SUC (PF) 100 MG IJ SOLR
25.0000 mg | Freq: Two times a day (BID) | INTRAMUSCULAR | Status: DC
Start: 2021-06-02 — End: 2021-06-03
  Administered 2021-06-02 – 2021-06-03 (×2): 25 mg via INTRAVENOUS
  Filled 2021-06-02 (×2): qty 2

## 2021-06-02 MED ORDER — INSULIN ASPART 100 UNIT/ML IJ SOLN
0.0000 [IU] | Freq: Three times a day (TID) | INTRAMUSCULAR | Status: DC
  Administered 2021-06-02: 11 [IU] via SUBCUTANEOUS
  Administered 2021-06-02: 8 [IU] via SUBCUTANEOUS
  Administered 2021-06-03: 11 [IU] via SUBCUTANEOUS

## 2021-06-02 MED ORDER — INSULIN ASPART 100 UNIT/ML IJ SOLN: 0.0000 [IU] | Freq: Three times a day (TID) | INTRAMUSCULAR | Status: DC

## 2021-06-02 MED ORDER — IPRATROPIUM-ALBUTEROL 0.5-2.5 (3) MG/3ML IN SOLN: 3.0000 mL | Freq: Four times a day (QID) | RESPIRATORY_TRACT | Status: DC

## 2021-06-02 MED ORDER — IPRATROPIUM-ALBUTEROL 0.5-2.5 (3) MG/3ML IN SOLN
3.0000 mL | Freq: Three times a day (TID) | RESPIRATORY_TRACT | Status: DC
  Administered 2021-06-02: 3 mL via RESPIRATORY_TRACT
  Filled 2021-06-02: qty 3

## 2021-06-02 MED ORDER — IPRATROPIUM-ALBUTEROL 0.5-2.5 (3) MG/3ML IN SOLN
RESPIRATORY_TRACT | Status: AC
  Administered 2021-06-02: 3 mL
  Filled 2021-06-02: qty 3

## 2021-06-02 MED ORDER — FUROSEMIDE 10 MG/ML IJ SOLN
20.0000 mg | Freq: Three times a day (TID) | INTRAMUSCULAR | Status: AC
  Administered 2021-06-02 – 2021-06-03 (×3): 20 mg via INTRAVENOUS
  Filled 2021-06-02 (×3): qty 2

## 2021-06-02 MED ORDER — INSULIN GLARGINE-YFGN 100 UNIT/ML ~~LOC~~ SOLN
5.0000 [IU] | Freq: Every day | SUBCUTANEOUS | Status: DC
  Administered 2021-06-02: 5 [IU] via SUBCUTANEOUS
  Filled 2021-06-02 (×3): qty 0.05

## 2021-06-02 MED ORDER — ALBUTEROL SULFATE (2.5 MG/3ML) 0.083% IN NEBU: 2.5000 mg | INHALATION_SOLUTION | RESPIRATORY_TRACT | Status: DC | PRN

## 2021-06-02 NOTE — Progress Notes (Addendum)
[facility]                                                                               Ranique Good, is a 71 y.o. female, DOB - 07-25-1950, ZHY:[id number] Admit date - 05/24/2021    Outpatient Primary MD for the patient is Tammy Prince, MD  LOS - 9  days    Brief summary   71 year old female with history of left ankle infection, MRSA bacteremia and tenosynovitis of the left ankle status post I&D, essential hypertension, hyperlipidemia, type 2 diabetes, chronic iron deficiency anemia with baseline hemoglobin about 8, hypothyroidism, paroxysmal A-fib on Eliquis, GERD, coronary artery disease status post PCI with stent presented from home with gradually worsening generalized weakness for 2 days, poor appetite and ongoing chronic nausea.  At the emergency room blood pressure stable.  Hemoglobin 5.6, creatinine 1.89 from baseline 0.78. GI was consulted.  Underwent EGD.   Assessment & Plan    Assessment and Plan: * Symptomatic anemia - Presented with acute blood loss anemia, has history of chronic iron deficiency anemia in the setting of eliquis and Plavix -Presented with hemoglobin of 5.6, received 2 units packed RBCs.  Placed on IV PPI. -GI was consulted, upper endoscopy 3/5 with esophageal ulcer, no active bleeding. -Hemoglobin continued to trend down, no obvious bleeding, hemoglobin trended down to 7.6 on 3/9, transfused 2 units packed RBCs  -GI reconsulted, tagged RBC scan negative.  H&H stable. -Discussed with vascular surgery, Dr. Chestine Spore on 3/10, patient did not have stent placed in 03/2021 after abdominal aortogram hence she can come off Plavix, can continue aspirin 81 mg daily.   -Discussed with cardiology, Dr. Graciela Husbands regarding Eliquis (for paroxysmal A-fib) (patient follows Dr. Herbie Baltimore), given CHA2DS2-VASc score 6 (HTN, diabetes, vascular disease, CHF, age/f), recommended resuming Eliquis.  If patient bleeds or hemoglobin drops down with eliquis, then discontinue AC,  patient will then meet criteria for Watchman procedure and then be placed on aspirin 81.  D/w Dr Herbie Baltimore, who agrees. -Placed on eliquis on 3/11, H&H stable   GI bleed -  EGD 3/5 showed esophageal ulcer, no active bleeding -Continue Protonix 40 mg twice daily -Status post 2 units packed RBCs on 3/9, -Tagged RBC scan negative.  -Resumed Eliquis on 3/11, H&H stable.  Had a BM yesterday, states no bleeding noted.  Acute on chronic systolic CHF (congestive heart failure) (HCC) - Noted to have some shortness of breath with mild wheezing.  No history of COPD however she does have volume overload due to IV fluid hydration and packed RBC transfusion -BNP 1075, chest x-ray with small left pleural effusion, will give Lasix 20 mg IV every 8 hours x3 doses and reassess in a.m. -Continue strict I's and O's and daily weights  Liver lesion - Patient had recent MRSA infection treated with 2 weeks of vancomycin, intolerance to Zyvox. -CT of the abdomen showed 2x 2cm heterogeneous mass in the liver.  Left ankle clinically improving -MRI of the liver consistent with benign cyst or hemangioma  AKI (acute kidney injury) (HCC) - Presented with creatinine of 1.12, BUN 26 -Resolved, monitor with diuresis  Essential hypertension - Hypotensive and orthostatic, -Borderline BP secondary to  relative adrenal insufficiency.  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   Hypothyroidism - Continue Synthroid  Hypokalemia - Replaced p.o. x1 on 3/9  Generalized weakness - PT OT evaluation-> close to her baseline.  PAD (peripheral artery disease) (HCC) - She had aortic stent placed  within a year, on Plavix and Eliquis.  Outpatient follow-up with cardiology and vascular surgery. -Please see above regarding eliquis and Plavix  Diabetes mellitus, type 2 (HCC) -Hemoglobin A1c 7.2 on 04/23/2021 -CBGs now elevated due to Solu-Cortef, increase sliding scale insulin to moderate, continue Semglee -Tapering Solu-Cortef, hence CBGs expected to improve  Nausea - Nausea improving, continue soft diet -Continue antiemetics as needed   Pressure Injury Documentation: Pressure Injury 05/31/21 Sacrum Mid;Lower Stage 2 -  Partial thickness loss of dermis presenting as a shallow open injury with a red, pink wound bed without slough. denuded area between buttock on sacrum-pink and peeling (Active)  05/31/21 0300  Location: Sacrum  Location Orientation: Mid;Lower  Staging: Stage 2 -  Partial thickness loss of dermis presenting as a shallow open injury with a red, pink wound bed without slough.  Wound Description (Comments): denuded area between buttock on sacrum-pink and peeling  Present on Admission: No   Obesity Estimated body mass index is 33.32 kg/m as calculated from the following:   Height as of this encounter: 5\' 7"  (1.702 m).   Weight as of this encounter: 96.5 kg.  Code Status: Full CODE STATUS DVT Prophylaxis:  SCDs Start: 05/24/21 2214 apixaban (ELIQUIS) tablet 5 mg   Level of Care: Level of care: Telemetry Medical Family Communication: Updated patient.  She states that her son is out of town today   Disposition Plan:     Remains inpatient appropriate: Pulmonary edema, Lasix 20 mg IV every 8 hours x3 doses, hopefully DC home in a.m. if H&H stable and respiratory status improving  Procedures:  EGD  Consultants:   Gastroenterology Cardiology  Antimicrobials:    Medications   sodium chloride   Intravenous Once   apixaban  5 mg Oral BID   atorvastatin  40 mg Oral QPM   furosemide  20 mg Intravenous Q8H   hydrocortisone sod succinate (SOLU-CORTEF) inj  25 mg  Intravenous Q12H   insulin aspart  0-15 Units Subcutaneous TID WC   insulin aspart  0-5 Units Subcutaneous QHS   insulin glargine-yfgn  5 Units Subcutaneous QHS   levothyroxine  125 mcg Oral QAC breakfast   metoCLOPramide (REGLAN) injection  5 mg Intravenous TID AC   midodrine  5 mg Oral TID WC   nystatin   Topical BID   pantoprazole  40 mg Oral BID   polyethylene glycol  17 g Oral Daily     Subjective:   Tammy Good was seen and examined today.  Does not feel too good today, states she has been coughing and feeling wheezy.  Had BM yesterday, no bleeding noted patient denies dizziness, chest pain, abdominal pain, N/V/D/C, new weakness.  Objective:   Vitals:   06/02/21 0926 06/02/21 1238 06/02/21 1430 06/02/21 1633  BP:  140/82  121/64  Pulse:  89  93  Resp:  20  (!) 21  Temp:  98.3 F (  36.8 C)  97.7 F (36.5 C)  TempSrc:  Oral  Oral  SpO2: 96% 95% 97% 97%  Weight:      Height:        Intake/Output Summary (Last 24 hours) at 06/02/2021 1738 Last data filed at 06/02/2021 1428 Gross per 24 hour  Intake 240 ml  Output 1900 ml  Net -1660 ml   Filed Weights   05/24/21 1713 05/25/21 1330  Weight: 99.8 kg 96.5 kg     Exam General: Alert and oriented x 3, NAD Cardiovascular: S1 S2 auscultated, RRR Respiratory: Bibasilar Rales Gastrointestinal: Soft, nontender, nondistended, + bowel sounds Ext: no pedal edema bilaterally Neuro: no new deficits Psych: Normal affect and demeanor, alert and oriented x3    Data Reviewed:  I have personally reviewed following labs    CBC Lab Results  Component Value Date   WBC 8.1 06/02/2021   RBC 3.52 (L) 06/02/2021   HGB 9.7 (L) 06/02/2021   HCT 30.7 (L) 06/02/2021   MCV 87.2 06/02/2021   MCH 27.6 06/02/2021   PLT 509 (H) 06/02/2021   MCHC 31.6 06/02/2021   RDW 18.7 (H) 06/02/2021   LYMPHSABS 1.7 05/28/2021   MONOABS 0.7 05/28/2021   EOSABS 0.5 05/28/2021   BASOSABS 0.0 05/28/2021     Last metabolic panel Lab  Results  Component Value Date   NA 138 06/01/2021   K 4.1 06/01/2021   CL 106 06/01/2021   CO2 22 06/01/2021   BUN <5 (L) 06/01/2021   CREATININE 1.13 (H) 06/01/2021   GLUCOSE 175 (H) 06/01/2021   GFRNONAA 53 (L) 06/01/2021   GFRAA 21 (L) 06/23/2019   CALCIUM 7.7 (L) 06/01/2021   PHOS 2.3 (L) 05/26/2021   PROT 5.4 (L) 05/26/2021   ALBUMIN 2.5 (L) 05/26/2021   BILITOT 1.1 05/26/2021   ALKPHOS 72 05/26/2021   AST 18 05/26/2021   ALT 12 05/26/2021   ANIONGAP 10 06/01/2021    CBG (last 3)  Recent Labs    06/02/21 0826 06/02/21 1241 06/02/21 1635  GLUCAP 213* 263* 307*      Coagulation Profile: No results for input(s): INR, PROTIME in the last 168 hours.   Radiology Studies: I have personally reviewed the imaging studies  NM GI Blood Loss  Result Date: 05/31/2021 CLINICAL DATA:  GI bleeding EXAM: NUCLEAR MEDICINE GASTROINTESTINAL BLEEDING SCAN TECHNIQUE: Sequential abdominal images were obtained following intravenous administration of Tc-29m labeled red blood cells. RADIOPHARMACEUTICALS:  23.2 mCi Tc-56m pertechnetate in-vitro labeled red cells. COMPARISON:  CT done on 05/27/2021 FINDINGS: There is no demonstrable extravasation of tracer during 2 hours of observation. IMPRESSION: There is no demonstrable focal extravasation of tracer in the visualized portions of abdomen and pelvis. Electronically Signed   By: Ernie Avena M.D.   On: 05/31/2021 14:42   DG Chest Port 1 View  Result Date: 06/02/2021  IMPRESSION: 1. Small left pleural effusion with atelectasis and/or consolidation in the left lower lobe. 2. Mild cardiomegaly. 3. Aortic atherosclerosis. Electronically Signed   By: Trudie Reed M.D.   On: 06/02/2021 09:50       Dominyck Reser M.D. [facility] 06/02/2021, 5:38 PM  Available via Epic secure chat 7am-7pm After 7 pm, please refer to night coverage provider listed on amion.

## 2021-06-02 NOTE — Progress Notes (Signed)
? ? ? ?  [facility] Progress Note ? ?CC:  Anemia ? ?Assessment / Plan: ?Acute on chronic symptomatic anemia due to suspected GI blood loss in the setting of Eliquis and Plavix, now on hold. However, there has been no overt bleeding. Acute change in anemia initially thought to be due to esophageal ulcer. Hemoglobin continued to decline without overt bleeding. Required 2 units of PRBCs on 05/30/21.  Subsequent bleeding scan was negative for active bleed 05/31/21. Hemoglobin is now stable without any overt bleeding. Continue serial hgb/hct in the meantime. Other concurrent causes of anemia should be considered. Consider CTA with any additional overt bleeding. Dr. Benson Norway recommended office follow-up in 2 weeks. ? ?Distal esophageal ulcer on EGD 05/26/21. No active bleeding but the performing endoscopy felt this was the source of recent bleeding. She has no associated symptoms. On PPI BID.  ? ?Liver mass on CT. MRI suggests benign lesion such as cyst or hemangioma in addition to smaller cyst in the left lobe.  ? ?History of colon polyp: Per Dr. Benson Norway, she had a small ascending colon tubular adenoma on screening colonoscopy 2020. Procedure note not available in Epic for my review.  ? ?Dr. Benson Norway recommended office follow-up in 2 weeks.  ?I am covering for Dr. Benson Norway who will be back 06/03/21. The inpatient GI team will move to stand-by. Please call with any additional questions or concerns.  ? ?Subjective: ?Feeling better. No overt bleeding. No bowel movement in over 24 hours. GI ROS is negative. ? ?No family present at the time of my evaluation.  ? ?Objective:  ?Vital signs in last 24 hours: ?Temp:  [97.7 ?F (36.5 ?C)-98.4 ?F (36.9 ?C)] 98.4 ?F (36.9 ?C) (03/12 0827) ?Pulse Rate:  [88-95] 88 (03/12 0827) ?Resp:  [18-21] 21 (03/12 0827) ?BP: (108-133)/(70-89) 108/89 (03/12 0827) ?SpO2:  [92 %-96 %] 95 % (03/12 0827) ?Last BM Date : 05/31/21 ?General:   Alert, in NAD ?Heart:  Regular rate and rhythm; no murmurs ?Pulm:  Clear anteriorly; no wheezing ?Abdomen:  Soft. Nontender. Nondistended. Normal bowel sounds. No rebound or guarding. ?LAD: No inguinal or umbilical LAD ?Extremities:  Without edema. ?Neurologic:  Alert and  oriented x4;  grossly normal neurologically. ?Psych:  Alert and cooperative. Normal mood and affect. ? ? ?Lab Results: ?Recent Labs  ?  05/31/21 ?0432 06/01/21 ?0645 06/02/21 ?0634  ?WBC 7.3 9.5 8.1  ?HGB 9.4* 9.7* 9.7*  ?HCT 29.9* 31.2* 30.7*  ?PLT 312 447* 509*  ? ?BMET ?Recent Labs  ?  05/30/21 ?1548 06/01/21 ?0645  ?NA 140 138  ?K 3.3* 4.1  ?CL 106 106  ?CO2 25 22  ?GLUCOSE 121* 175*  ?BUN <5* <5*  ?CREATININE 1.03* 1.13*  ?CALCIUM 7.7* 7.7*  ? ? ? ?NM GI Blood Loss ? ?Result Date: 05/31/2021 ?CLINICAL DATA:  GI bleeding EXAM: NUCLEAR MEDICINE GASTROINTESTINAL BLEEDING SCAN TECHNIQUE: Sequential abdominal images were obtained following intravenous administration of Tc-38mlabeled red blood cells. RADIOPHARMACEUTICALS:  23.2 mCi Tc-94mertechnetate in-vitro labeled red cells. COMPARISON:  CT done on 05/27/2021 FINDINGS: There is no demonstrable extravasation of tracer during 2 hours of observation. IMPRESSION: There is no demonstrable focal extravasation of tracer in the visualized portions of abdomen and pelvis. Electronically Signed   By: PaElmer Picker.D.   On: 05/31/2021 14:42   ? ? ? ? LOS: 9 days  ? ?[place]3/02/2022, 9:19 AM ? ?  ?

## 2021-06-02 NOTE — Progress Notes (Signed)
RT note- ?Now that the patient has diuresed, upper airway wheezing is gone. Incentive spirometer instructed and she achieves 762m. Protocol assessment done, treatments changed to Albuterol Q4PRN. Patient informed of change and if she needs a treatment to call.  ? ?

## 2021-06-02 NOTE — Assessment & Plan Note (Signed)
Continue Synthroid °

## 2021-06-02 NOTE — Assessment & Plan Note (Addendum)
-   Noted to have some shortness of breath with mild wheezing.  No history of COPD however she does have volume overload due to IV fluid hydration and packed RBC transfusion ?-BNP 1075, chest x-ray with small left pleural effusion, ?-Received 20 mg IV Lasix x3 doses, significant improvement, diuresed well. ?-Currently lungs clear, euvolemic, will place on Lasix 20 mg daily as needed. ?

## 2021-06-03 ENCOUNTER — Other Ambulatory Visit (HOSPITAL_COMMUNITY): Payer: Self-pay

## 2021-06-03 DIAGNOSIS — I5023 Acute on chronic systolic (congestive) heart failure: Secondary | ICD-10-CM | POA: Diagnosis not present

## 2021-06-03 DIAGNOSIS — D649 Anemia, unspecified: Secondary | ICD-10-CM | POA: Diagnosis not present

## 2021-06-03 DIAGNOSIS — E1100 Type 2 diabetes mellitus with hyperosmolarity without nonketotic hyperglycemic-hyperosmolar coma (NKHHC): Secondary | ICD-10-CM | POA: Diagnosis not present

## 2021-06-03 DIAGNOSIS — N179 Acute kidney failure, unspecified: Secondary | ICD-10-CM | POA: Diagnosis not present

## 2021-06-03 LAB — BASIC METABOLIC PANEL
Anion gap: 7 (ref 5–15)
BUN: 7 mg/dL — ABNORMAL LOW (ref 8–23)
CO2: 30 mmol/L (ref 22–32)
Calcium: 7.9 mg/dL — ABNORMAL LOW (ref 8.9–10.3)
Chloride: 103 mmol/L (ref 98–111)
Creatinine, Ser: 1.31 mg/dL — ABNORMAL HIGH (ref 0.44–1.00)
GFR, Estimated: 44 mL/min — ABNORMAL LOW (ref >60)
Glucose, Bld: 237 mg/dL — ABNORMAL HIGH (ref 70–99)
Potassium: 2.8 mmol/L — ABNORMAL LOW (ref 3.5–5.1)
Sodium: 140 mmol/L (ref 135–145)

## 2021-06-03 LAB — CBC
HCT: 32.2 % — ABNORMAL LOW (ref 36.0–46.0)
Hemoglobin: 10 g/dL — ABNORMAL LOW (ref 12.0–15.0)
MCH: 27 pg (ref 26.0–34.0)
MCHC: 31.1 g/dL (ref 30.0–36.0)
MCV: 87 fL (ref 80.0–100.0)
Platelets: 639 10*3/uL — ABNORMAL HIGH (ref 150–400)
RBC: 3.7 MIL/uL — ABNORMAL LOW (ref 3.87–5.11)
RDW: 19 % — ABNORMAL HIGH (ref 11.5–15.5)
WBC: 8.4 10*3/uL (ref 4.0–10.5)
nRBC: 0 % (ref 0.0–0.2)

## 2021-06-03 LAB — GLUCOSE, CAPILLARY: Glucose-Capillary: 313 mg/dL — ABNORMAL HIGH (ref 70–99)

## 2021-06-03 MED ORDER — FUROSEMIDE 20 MG PO TABS: 20.0000 mg | ORAL_TABLET | Freq: Every day | ORAL | 11 refills | Status: DC | PRN

## 2021-06-03 MED ORDER — POLYETHYLENE GLYCOL 3350 17 GM/SCOOP PO POWD
17.0000 g | Freq: Every day | ORAL | 0 refills | Status: AC
  Filled 2021-06-03: qty 238, 14d supply, fill #0

## 2021-06-03 MED ORDER — FUROSEMIDE 20 MG PO TABS
20.0000 mg | ORAL_TABLET | Freq: Every day | ORAL | 0 refills | Status: DC | PRN
  Filled 2021-06-03: qty 30, 30d supply, fill #0

## 2021-06-03 MED ORDER — PROMETHAZINE HCL 12.5 MG PO TABS
12.5000 mg | ORAL_TABLET | Freq: Four times a day (QID) | ORAL | 0 refills | Status: DC | PRN
  Filled 2021-06-03: qty 30, 8d supply, fill #0

## 2021-06-03 MED ORDER — DEXAMETHASONE 0.5 MG PO TABS
0.5000 mg | ORAL_TABLET | Freq: Every morning | ORAL | Status: DC
  Filled 2021-06-03: qty 1

## 2021-06-03 MED ORDER — METOCLOPRAMIDE HCL 5 MG PO TABS
5.0000 mg | ORAL_TABLET | Freq: Three times a day (TID) | ORAL | 1 refills | Status: DC | PRN
  Filled 2021-06-03: qty 90, 30d supply, fill #0

## 2021-06-03 MED ORDER — MIDODRINE HCL 5 MG PO TABS
5.0000 mg | ORAL_TABLET | Freq: Two times a day (BID) | ORAL | 1 refills | Status: DC
  Filled 2021-06-03: qty 60, 30d supply, fill #0

## 2021-06-03 MED ORDER — MIDODRINE HCL 5 MG PO TABS: 5.0000 mg | ORAL_TABLET | Freq: Two times a day (BID) | ORAL | Status: DC

## 2021-06-03 NOTE — TOC Transition Note (Signed)
Transition of Care (TOC) - CM/SW Discharge Note ? ? ?Patient Details  ?Name: Tammy Good ?MRN: [id number] ?Date of Birth: Jan 02, 1951 ? ?Transition of Care (TOC) CM/SW Contact:  ?Cyndi Bender, RN ?Phone Number: ?06/03/2021, 12:11 PM ? ? ?Clinical Narrative:    ?Patient stable for discharge.  ?Home Health RN resumption orders placed. ?Cory with [facility] notified of discharge. ? ?Final next level of care: [place] ?Barriers to Discharge: Barriers Resolved ? ? ?Patient Goals and CMS Choice ?Patient states their goals for this hospitalization and ongoing recovery are:: return home ?CMS Medicare.gov Compare Post Acute Care list provided to:: Patient ?Choice offered to / list presented to : Patient ? ?Discharge Placement ?  ?           ? Home health ?  ?  ?  ? ?Discharge Plan and Services ?  ?Discharge Planning Services: CM Consult ?           ?  ?  ?  ?  ?  ?HH Arranged: RN ?[place] Agency: [facility] ?Date HH Agency Contacted: 06/03/21 ?Time [place]: 4481 ?  ? ?Social Determinants of Health (SDOH) Interventions ?  ? ? ?Readmission Risk Interventions ?Readmission Risk Prevention Plan 06/03/2021 05/30/2019 05/27/2019  ?Transportation Screening Complete Complete Complete  ?PCP or Specialist Appt within 5-7 Days - Complete Complete  ?Home Care Screening - Complete Complete  ?Medication Review (RN CM) - Complete Complete  ?Medication Review Press photographer) Complete - -  ?PCP or Specialist appointment within 3-5 days of discharge Complete - -  ?[place] or Home Care Consult Complete - -  ?SW Recovery Care/Counseling Consult Complete - -  ?Palliative Care Screening Not Applicable - -  ?[place] Not Applicable - -  ?Some recent data might be hidden  ? ? ? ? ? ?

## 2021-06-03 NOTE — Progress Notes (Signed)
[facility] to be D/C'd Home per MD order.  Discussed with the patient and all questions fully answered. ? ?VSS, Skin clean, dry and intact without evidence of skin break down, no evidence of skin tears noted. ?IV catheter discontinued intact. Site without signs and symptoms of complications. Dressing and pressure applied. ? ?An After Visit Summary was printed and given to the patient. Patient received prescription. ? ?D/c education completed with patient/family including follow up instructions, medication list, d/c activities limitations if indicated, with other d/c instructions as indicated by MD - patient able to verbalize understanding, all questions fully answered.  ? ?Patient instructed to return to ED, call 911, or call MD for any changes in condition.  ? ?Patient escorted via [place], and D/C home via private auto. ? ?[place] ?06/03/2021 12:01 PM  ?

## 2021-06-03 NOTE — Discharge Summary (Signed)
Physician Discharge Summary   Patient: Tammy Good MRN: [id number] DOB: [birth year]  Admit date:     05/24/2021  Discharge date: 06/03/21  Discharge Physician: Estill Cotta   PCP: [name], MD   Recommendations at discharge:   Plavix discontinued Continue Eliquis 5 mg p.o. twice daily Lasix 20 mg p.o. daily as needed for shortness of breath or edema Home health RN for wound care Please wean off midodrine if BP remains stable  Discharge Diagnoses:    Symptomatic anemia   GI bleed   Essential hypertension   AKI (acute kidney injury) ([place])   Liver lesion   Acute on chronic systolic CHF (congestive heart failure) ([place])   Diabetes mellitus, type 2 (HCC)   PAD (peripheral artery disease) (HCC)   Generalized weakness   Hypokalemia   Hypothyroidism   Nausea   Pressure injury of skin   Ulcer of esophagus without bleeding   Panhypopituitarism    Hospital Course: 71 year old female with history of left ankle infection, MRSA bacteremia and tenosynovitis of the left ankle status post I&D, essential hypertension, hyperlipidemia, type 2 diabetes, chronic iron deficiency anemia with baseline hemoglobin about 8, hypothyroidism, paroxysmal A-fib on Eliquis, GERD, coronary artery disease status post PCI with stent presented from home with gradually worsening generalized weakness for 2 days, poor appetite and ongoing chronic nausea.  At the emergency room blood pressure stable.  Hemoglobin 5.6, creatinine 1.89 from baseline 0.78. GI was consulted.  Underwent EGD.  Assessment and Plan: * Symptomatic anemia - Presented with acute blood loss anemia, has history of chronic iron deficiency anemia in the setting of eliquis and Plavix -Presented with hemoglobin of 5.6, received 2 units packed RBCs.  Placed on IV PPI. -GI was consulted, upper endoscopy 3/5 with esophageal ulcer, no active bleeding. -Hemoglobin continued to trend down, no obvious bleeding, hemoglobin trended down to 7.6 on  3/9, transfused 2 units packed RBCs  -GI reconsulted, tagged RBC scan negative.  H&H stable. -Discussed with vascular surgery, [name] on 3/10, patient did not have stent placed in 03/2021 after abdominal aortogram hence she can come off Plavix, can continue aspirin 81 mg daily.   -Discussed with cardiology, Dr. Caryl Comes regarding Eliquis (for paroxysmal A-fib) (patient follows Dr. Ellyn Hack), given CHA2DS2-VASc score 6 (HTN, diabetes, vascular disease, CHF, age/f), recommended resuming Eliquis.  If patient bleeds or hemoglobin drops down with eliquis, then discontinue AC, patient will then meet criteria for Watchman procedure and then be placed on aspirin 81.  D/w Dr Ellyn Hack, who agrees. -Placed on eliquis on 3/11, H&H has remained stable.   GI bleed -  EGD 3/5 showed esophageal ulcer, no active bleeding -Continue Protonix 40 mg twice daily -Status post 2 units packed RBCs on 3/9, -Tagged RBC scan negative.  -Resumed Eliquis on 3/11, H&H has remained stable and improving.  Acute on chronic systolic CHF (congestive heart failure) ([place]) - Noted to have some shortness of breath with mild wheezing.  No history of COPD however she does have volume overload due to IV fluid hydration and packed RBC transfusion -BNP 1075, chest x-ray with small left pleural effusion, -Received 20 mg IV Lasix x3 doses, significant improvement, diuresed well. -Currently lungs clear, euvolemic, will place on Lasix 20 mg daily as needed.  Liver lesion - Patient had recent MRSA infection treated with 2 weeks of vancomycin, intolerance to Zyvox. -CT of the abdomen showed 2x 2cm heterogeneous mass in the liver.  Left ankle clinically improving -MRI of the liver consistent with benign  cyst or hemangioma  AKI (acute kidney injury) ([place]) - Presented with creatinine of 1.12, BUN 26 -Mild creatinine bump with IV Lasix, tolerating diet and drinking well.  No further need of Lasix.  Essential hypertension -Borderline BP  secondary to relative adrenal insufficiency.  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  Patient will resume oral Decadron and continue Synthroid. -BP now stable, decreased midodrine to 5 mg p.o. twice daily, can wean off at the time of follow-up appointment with PCP   Hypothyroidism - Continue Synthroid  Hypokalemia - Replaced p.o. x1 on 3/9  Generalized weakness - PT OT evaluation-> close to her baseline.  No PT needs  PAD (peripheral artery disease) ([place]) - She had aortic stent placed within a year, on Plavix and Eliquis.  Outpatient follow-up with cardiology and vascular surgery. -Please see above regarding eliquis and Plavix  Diabetes mellitus, type 2 ([place]) -Hemoglobin A1c 7.2 on 04/23/2021 -CBGs improving, continue outpatient regimen  Nausea - Nausea improving, tolerating soft diet -Continue Reglan, Phenergan as needed        Pain control - [place] Controlled Substance Reporting System database was reviewed. and patient was instructed, not to drive, operate heavy machinery, perform activities at heights, swimming or participation in water activities or provide baby-sitting services while on Pain, Sleep and Anxiety Medications; until their outpatient Physician has advised to do so again. Also recommended to not to take more than prescribed Pain, Sleep and Anxiety Medications.  Consultants: Gastroenterology, cardiology, vascular surgery Procedures performed: EGD Disposition: Home Diet recommendation:  Discharge Diet Orders (From admission, onward)     Start     Ordered   06/03/21 0000  Diet - low sodium heart healthy        06/03/21 1047   06/03/21 0000  Diet Carb Modified        06/03/21 1047           Carb modified  diet DISCHARGE MEDICATION: Allergies as of 06/03/2021       Reactions   Strawberry Extract Itching, Swelling, Anaphylaxis   Mouth swells and gets itchy        Medication List     STOP taking these medications    clopidogrel 75 MG tablet Commonly known as: PLAVIX   losartan 25 MG tablet Commonly known as: COZAAR   metoprolol succinate 25 MG 24 hr tablet Commonly known as: TOPROL-XL   prochlorperazine 5 MG tablet Commonly known as: COMPAZINE   spironolactone 25 MG tablet Commonly known as: ALDACTONE       TAKE these medications    acetaminophen 500 MG tablet Commonly known as: TYLENOL Take 500-1,000 mg by mouth 3 (three) times daily as needed for headache (pain).   apixaban 5 MG Tabs tablet Commonly known as: ELIQUIS Take 1 tablet (5 mg total) by mouth 2 (two) times daily.   atorvastatin 40 MG tablet Commonly known as: LIPITOR Take 1 tablet (40 mg total) by mouth daily at 6 PM. What changed: when to take this   B-D UF III MINI PEN NEEDLES 31G X 5 MM Misc Generic drug: Insulin Pen Needle Inject into the skin.   Basaglar KwikPen 100 UNIT/ML Inject 42 Units into the skin at bedtime.   dapagliflozin propanediol 10 MG Tabs tablet Commonly known as: FARXIGA Take 10 mg by  mouth every morning.   dexamethasone 0.5 MG tablet Commonly known as: DECADRON Take 0.5 mg by mouth every morning.   ferrous sulfate 325 (65 FE) MG tablet Take 325 mg by mouth every evening.   Fish Oil 1000 MG Caps Take 1,000 mg by mouth every morning.   FreeStyle Libre 2 Sensor Misc Apply topically every 14 (fourteen) days.   furosemide 20 MG tablet Commonly known as: Lasix Take 1 tablet (20 mg total) by mouth daily as needed for fluid or edema.   levothyroxine 125 MCG tablet Commonly known as: SYNTHROID Take 1 tablet (125 mcg total) by mouth daily before breakfast.   metoCLOPramide 5 MG tablet Commonly known as: Reglan Take 1 tablet (5 mg total) by mouth 3 (three) times  daily as needed for nausea or vomiting. Take before meals as needed, if having nausea   midodrine 5 MG tablet Commonly known as: PROAMATINE Take 1 tablet (5 mg total) by mouth 2 (two) times daily with a meal.   nitroGLYCERIN 0.4 MG SL tablet Commonly known as: Nitrostat Place 1 tablet (0.4 mg total) under the tongue every 5 (five) minutes as needed for chest pain (up to 3 doses. If taking 3rd dose call 911).   NovoLOG FlexPen 100 UNIT/ML FlexPen Generic drug: insulin aspart Inject 3-8 Units into the skin See admin instructions. Inject 3-8 units subcutaneously up to three times daily per sliding scale - based on carb count   nystatin cream Commonly known as: MYCOSTATIN Apply 1 application topically 2 (two) times daily. What changed:  when to take this reasons to take this   pantoprazole 40 MG tablet Commonly known as: Protonix Take 1 tablet (40 mg total) by mouth 2 (two) times daily before a meal.   polyethylene glycol powder 17 GM/SCOOP powder Commonly known as: GLYCOLAX/MIRALAX Take 1 capful with water (17 g) by mouth daily. Start taking on: June 04, 2021   promethazine 12.5 MG tablet Commonly known as: PHENERGAN Take 1 tablet (12.5 mg total) by mouth every 6 (six) hours as needed for nausea, vomiting or refractory nausea / vomiting.   Systane Complete 0.6 % Soln Generic drug: Propylene Glycol Place 1 drop into both eyes daily as needed (dry eyes).               Durable Medical Equipment  (From admission, onward)           Start     Ordered   05/30/21 1648  For home use only DME oxygen  Once       Question Answer Comment  Length of Need 12 Months   Mode or (Route) Nasal cannula   Liters per Minute 2   Frequency Continuous (stationary and portable oxygen unit needed)   Oxygen conserving device Yes   Oxygen delivery system Gas      05/30/21 1647              Discharge Care Instructions  (From admission, onward)           Start     Ordered    06/03/21 0000  Discharge wound care:       Comments: Wound care to Left LE surgical incision, proximal aspect:  Cleanse incision with NS, pat dry. Using a cotton-tipped applicator, tuck a size-appropriate piece of silver hydrofiber into the wound, making sure to fit into undermined areas.  Top with dry gauze 4x4, wrap foot and lower Le with Kerlix roll gauze/paper tape.  Top with ACE bandage applied in  a similar manner.   06/03/21 [place], MD. Schedule an appointment as soon as possible for a visit in 5 day(s).   Specialty: Endocrinology Why: for hospital follow-up Contact information: [place] [address] [phone]         Leonie Man, MD. Schedule an appointment as soon as possible for a visit in 2 week(s).   Specialty: Cardiology Why: for hospital follow-up Contact information: [place] [address] [place], [facility] Follow up.   Specialty: Home Health Services Why: [place] arranged. They will contact you for appointment Contact information: [place] [address] [phone]         [name], MD. Schedule an appointment as soon as possible for a visit in 1 week(s).   Specialty: Endocrinology Why: Hospital follow up Contact information: [place] [address] [phone]         Leonie Man, MD .   Specialty: Cardiology Contact information: [address] [place] [address] [phone]                Discharge Exam: Danley Danker Weights   05/24/21 1713 05/25/21 1330  Weight: 99.8 kg 96.5 kg   S: Had significant diuresis, feels great today.  Wants to go home.  Physical Exam General: Alert and oriented x 3, NAD Cardiovascular: S1 S2 clear, RRR. No pedal edema b/l Respiratory: CTAB, no wheezing, rales or rhonchi Gastrointestinal: Soft,  nontender, nondistended, NBS Ext: no pedal edema bilaterally Neuro: no new deficits    Condition at discharge: good  The results of significant diagnostics from this hospitalization (including imaging, microbiology, ancillary and laboratory) are listed below for reference.   Imaging Studies: DG Chest 1 View  Result Date: 05/24/2021 CLINICAL DATA:  Nausea and shortness of breath for the past 2 days. EXAM: CHEST  1 VIEW COMPARISON:  Chest x-ray dated November 21, 2020. FINDINGS: Stable cardiomediastinal silhouette. Chronic mild lingular and left lower lobe atelectasis/scarring. No focal consolidation, pleural effusion, or pneumothorax. No acute osseous abnormality. IMPRESSION: No active disease. Electronically Signed   By: Titus Dubin M.D.   On: 05/24/2021 18:25   DG Ankle Complete Left  Result Date: 05/21/2021 Please see detailed radiograph report in office note.  NM GI Blood Loss  Result Date: 05/31/2021 CLINICAL DATA:  GI bleeding EXAM: NUCLEAR MEDICINE GASTROINTESTINAL BLEEDING SCAN TECHNIQUE: Sequential abdominal images were obtained following intravenous administration of Tc-50mlabeled red blood cells. RADIOPHARMACEUTICALS:  23.2 mCi Tc-965mertechnetate in-vitro labeled red cells. COMPARISON:  CT done on 05/27/2021 FINDINGS: There is no demonstrable extravasation of tracer during 2 hours of observation. IMPRESSION: There is no demonstrable focal extravasation of tracer in the visualized portions of abdomen and pelvis. Electronically Signed   By: PaElmer Picker.D.   On: 05/31/2021 14:42   CT ABDOMEN PELVIS W CONTRAST  Result Date: 05/27/2021 CLINICAL DATA:  Nausea and vomiting, anemia. EXAM: CT ABDOMEN AND PELVIS WITH CONTRAST TECHNIQUE: Multidetector CT imaging of the abdomen and pelvis was performed using the standard protocol following bolus administration of intravenous contrast. RADIATION DOSE REDUCTION: This exam was performed according to the departmental dose-optimization  program which includes automated exposure control, adjustment of the mA and/or kV according to patient size and/or use of iterative reconstruction technique. CONTRAST:  10041mMNIPAQUE  IOHEXOL 350 MG/ML SOLN COMPARISON:  CT abdomen and pelvis 08/29/2013 FINDINGS: Lower chest: Heart is enlarged.  Lung bases are clear. Hepatobiliary: Heterogeneously enhancing lesion in the dome of the liver measuring 2.2 x 2.2 cm was not definitely seen on the prior examination. Gallbladder surgically absent. There is no biliary ductal dilatation. Pancreas: Unremarkable. No pancreatic ductal dilatation or surrounding inflammatory changes. Spleen: Small rounded area of enhancement favored as flash fill hemangioma in the spleen measuring 9 mm. Spleen is nonenlarged. Adrenals/Urinary Tract: Adrenal glands are unremarkable. Kidneys are normal, without renal calculi, focal lesion, or hydronephrosis. Bladder is unremarkable. Stomach/Bowel: Stomach is within normal limits. Appendix appears normal. No evidence of bowel wall thickening, distention, or inflammatory changes. Vascular/Lymphatic: Aortic atherosclerosis. No enlarged abdominal or pelvic lymph nodes. Reproductive: Status post hysterectomy. No adnexal masses. Other: There are small fat containing inguinal and umbilical hernias. There is no ascites or free air. Musculoskeletal: There are severe degenerative changes throughout the spine. IMPRESSION: 1. No acute process in the abdomen or pelvis. 2. Heterogeneously enhancing lesion in the dome of the liver is new from prior and may represent a hemangioma, but is incompletely characterized on this study. Recommend further characterization with MRI. 3.  Aortic Atherosclerosis (ICD10-I70.0). Electronically Signed   By: Ronney Asters M.D.   On: 05/27/2021 20:36   MR LIVER WO CONRTAST  Result Date: 05/28/2021 CLINICAL DATA:  Indeterminate liver lesion on CT abdomen. MRI recommended for further evaluation. EXAM: MRI ABDOMEN WITHOUT CONTRAST  TECHNIQUE: Multiplanar multisequence MR imaging was performed without the administration of intravenous contrast. COMPARISON:  CT 05/27/2021 FINDINGS: Lower chest:  Lung bases are clear. Hepatobiliary: Well-circumscribed lesion in the dome liver is hyperintense on T2 weighted imaging measuring 2.3 by 1.7 cm on image 9/series 3. This corresponds to the lesion of concern on comparison CT. No IV contrast administered. Smaller similar lesion in the lateral aspect of the LEFT hepatic lobe measures 1 cm on image 20/series 3. No biliary duct dilatation. Postcholecystectomy. Common bile duct normal. Pancreas: Normal pancreatic parenchymal intensity. No ductal dilatation or inflammation. Spleen: 1 cm round hyperintense lesion on T2 weighted imaging in the spleen on image 19/3. Adrenals/urinary tract: Adrenal glands and kidneys are normal. Stomach/Bowel: Stomach and limited of the small bowel is unremarkable Vascular/Lymphatic: Abdominal aortic normal caliber. No retroperitoneal periportal lymphadenopathy. Musculoskeletal: No aggressive osseous lesion.  Scoliosis spine IMPRESSION: 1. Lesion in the dome of the liver has imaging characteristics most consistent with a benign cyst or hemangioma. Lesion cannot be fully characterized s without IV contrast. 2. Smaller similar cyst or hemangioma in the LEFT lateral hepatic lobe. 3. Probable cysts or hemangioma within the spleen. Electronically Signed   By: Suzy Bouchard M.D.   On: 05/28/2021 16:57   NM PULMONARY VENT AND PERF (V/Q Scan)  Result Date: 05/25/2021 CLINICAL DATA:  Shortness of breath and lightheadedness. EXAM: NUCLEAR MEDICINE PERFUSION LUNG SCAN TECHNIQUE: Perfusion images were obtained in multiple projections after intravenous injection of radiopharmaceutical. Ventilation scans intentionally deferred if perfusion scan and chest x-ray adequate for interpretation during COVID 19 epidemic. RADIOPHARMACEUTICALS:  4 mCi Tc-21mMAA IV COMPARISON:  Chest radiograph  dated May 24, 2021 FINDINGS: Large cardiac shadow consistent with cardiomegaly. No segmental perfusion defect. IMPRESSION: Normal or very low probability of pulmonary embolism. Cardiomegaly. Electronically Signed   By: IKeane PoliceD.O.   On: 05/25/2021 11:09   DG Chest Port 1 View  Result Date: 06/02/2021 CLINICAL DATA:  71year old female with history of dyspnea. EXAM: PORTABLE CHEST 1  VIEW COMPARISON:  Chest x-ray 05/24/2021. FINDINGS: Opacity at the left lung base obscuring the left hemidiaphragm, compatible with atelectasis and/or consolidation, likely with superimposed small left pleural effusion. Right lung is clear. No pneumothorax. No evidence of pulmonary edema. Heart size is mildly enlarged. Upper mediastinal contours are within normal limits. Atherosclerotic calcifications in the thoracic aorta. IMPRESSION: 1. Small left pleural effusion with atelectasis and/or consolidation in the left lower lobe. 2. Mild cardiomegaly. 3. Aortic atherosclerosis. Electronically Signed   By: Vinnie Langton M.D.   On: 06/02/2021 09:50    Microbiology: Results for orders placed or performed during the hospital encounter of 05/24/21  Resp Panel by RT-PCR (Flu A&B, Covid) Nasopharyngeal Swab     Status: None   Collection Time: 05/24/21  5:12 PM   Specimen: Nasopharyngeal Swab; Nasopharyngeal(NP) swabs in vial transport medium  Result Value Ref Range Status   SARS Coronavirus 2 by RT PCR NEGATIVE NEGATIVE Final    Comment: (NOTE) SARS-CoV-2 target nucleic acids are NOT DETECTED.  The SARS-CoV-2 RNA is generally detectable in upper respiratory specimens during the acute phase of infection. The lowest concentration of SARS-CoV-2 viral copies this assay can detect is 138 copies/mL. A negative result does not preclude SARS-Cov-2 infection and should not be used as the sole basis for treatment or other patient management decisions. A negative result may occur with  improper specimen collection/handling,  submission of specimen other than nasopharyngeal swab, presence of viral mutation(s) within the areas targeted by this assay, and inadequate number of viral copies(<138 copies/mL). A negative result must be combined with clinical observations, patient history, and epidemiological information. The expected result is Negative.  Fact Sheet for Patients:  EntrepreneurPulse.com.au  Fact Sheet for Healthcare Providers:  IncredibleEmployment.be  This test is no t yet approved or cleared by the [place] FDA and  has been authorized for detection and/or diagnosis of SARS-CoV-2 by FDA under an Emergency Use Authorization (EUA). This EUA will remain  in effect (meaning this test can be used) for the duration of the COVID-19 declaration under Section 564(b)(1) of the Act, 21 U.S.C.section 360bbb-3(b)(1), unless the authorization is terminated  or revoked sooner.       Influenza A by PCR NEGATIVE NEGATIVE Final   Influenza B by PCR NEGATIVE NEGATIVE Final    Comment: (NOTE) The Xpert Xpress SARS-CoV-2/FLU/RSV plus assay is intended as an aid in the diagnosis of influenza from Nasopharyngeal swab specimens and should not be used as a sole basis for treatment. Nasal washings and aspirates are unacceptable for Xpert Xpress SARS-CoV-2/FLU/RSV testing.  Fact Sheet for Patients: EntrepreneurPulse.com.au  Fact Sheet for Healthcare Providers: IncredibleEmployment.be  This test is not yet approved or cleared by the [place] FDA and has been authorized for detection and/or diagnosis of SARS-CoV-2 by FDA under an Emergency Use Authorization (EUA). This EUA will remain in effect (meaning this test can be used) for the duration of the COVID-19 declaration under Section 564(b)(1) of the Act, 21 U.S.C. section 360bbb-3(b)(1), unless the authorization is terminated or revoked.  Performed at [facility], [place] [address], [place], [place] [address]   Culture, blood (routine x 2)     Status: None   Collection Time: 05/28/21 10:16 AM   Specimen: BLOOD LEFT HAND  Result Value Ref Range Status   Specimen Description BLOOD LEFT HAND  Final   Special Requests   Final    BOTTLES DRAWN AEROBIC AND ANAEROBIC Blood Culture adequate volume   Culture  Final    NO GROWTH 5 DAYS Performed at [facility], [place] [address], [place], [place] [address]    Report Status 06/02/2021 FINAL  Final  Culture, blood (routine x 2)     Status: None   Collection Time: 05/28/21 10:24 AM   Specimen: BLOOD  Result Value Ref Range Status   Specimen Description BLOOD LEFT ANTECUBITAL  Final   Special Requests   Final    BOTTLES DRAWN AEROBIC AND ANAEROBIC Blood Culture adequate volume   Culture   Final    NO GROWTH 5 DAYS Performed at [facility], [place] [address], [place], [place] [address]    Report Status 06/02/2021 FINAL  Final    Labs: CBC: Recent Labs  Lab 05/28/21 0221 05/30/21 1548 05/31/21 0432 06/01/21 0645 06/02/21 0634 06/03/21 0150  WBC 16.9* 8.8 7.3 9.5 8.1 8.4  NEUTROABS 13.2*  --   --   --   --   --   HGB 7.9* 7.6* 9.4* 9.7* 9.7* 10.0*  HCT 25.4* 26.1* 29.9* 31.2* 30.7* 32.2*  MCV 90.1 91.9 90.1 88.6 87.2 87.0  PLT 231 284 312 447* 509* 540*   Basic Metabolic Panel: Recent Labs  Lab 05/28/21 0221 05/30/21 1548 06/01/21 0645 06/03/21 0150  NA 136 140 138 140  K 3.8 3.3* 4.1 2.8*  CL 104 106 106 103  CO2 '25 25 22 30  '$ GLUCOSE 128* 121* 175* 237*  BUN <5* <5* <5* 7*  CREATININE 0.84 1.03* 1.13* 1.31*  CALCIUM 7.8* 7.7* 7.7* 7.9*  MG 1.7  --   --   --    Liver Function Tests: No results for input(s): AST, ALT, ALKPHOS, BILITOT, PROT, ALBUMIN in the last 168 hours. CBG: Recent Labs  Lab 06/02/21 1241 06/02/21 1635 06/02/21 2057 06/02/21 2231 06/03/21 0756  GLUCAP 263* 307* 294* 292* 313*    Discharge time spent: greater than 30 minutes.  Signed: Estill Cotta,  MD [facility] 06/03/2021

## 2021-06-04 ENCOUNTER — Other Ambulatory Visit: Payer: Self-pay

## 2021-06-04 ENCOUNTER — Encounter (HOSPITAL_COMMUNITY): Payer: Self-pay | Admitting: Emergency Medicine

## 2021-06-04 ENCOUNTER — Emergency Department (HOSPITAL_COMMUNITY): Payer: Medicare Other

## 2021-06-04 ENCOUNTER — Emergency Department (HOSPITAL_COMMUNITY)
Admission: EM | Admit: 2021-06-04 | Discharge: 2021-06-05 | Disposition: A | Discharge status: Skilled Nursing Facility | Payer: Medicare Other | Attending: Emergency Medicine | Admitting: Emergency Medicine

## 2021-06-04 DIAGNOSIS — R600 Localized edema: Secondary | ICD-10-CM | POA: Insufficient documentation

## 2021-06-04 DIAGNOSIS — Z794 Long term (current) use of insulin: Secondary | ICD-10-CM | POA: Diagnosis not present

## 2021-06-04 DIAGNOSIS — R062 Wheezing: Secondary | ICD-10-CM | POA: Insufficient documentation

## 2021-06-04 DIAGNOSIS — E1165 Type 2 diabetes mellitus with hyperglycemia: Secondary | ICD-10-CM | POA: Insufficient documentation

## 2021-06-04 DIAGNOSIS — I509 Heart failure, unspecified: Secondary | ICD-10-CM | POA: Insufficient documentation

## 2021-06-04 DIAGNOSIS — Z7901 Long term (current) use of anticoagulants: Secondary | ICD-10-CM | POA: Diagnosis not present

## 2021-06-04 DIAGNOSIS — E876 Hypokalemia: Secondary | ICD-10-CM | POA: Insufficient documentation

## 2021-06-04 DIAGNOSIS — R000 Tachycardia, unspecified: Secondary | ICD-10-CM | POA: Insufficient documentation

## 2021-06-04 DIAGNOSIS — I110 Hypertensive heart disease with heart failure: Secondary | ICD-10-CM | POA: Insufficient documentation

## 2021-06-04 DIAGNOSIS — R262 Difficulty in walking, not elsewhere classified: Secondary | ICD-10-CM | POA: Diagnosis not present

## 2021-06-04 DIAGNOSIS — Z79899 Other long term (current) drug therapy: Secondary | ICD-10-CM | POA: Insufficient documentation

## 2021-06-04 DIAGNOSIS — I2510 Atherosclerotic heart disease of native coronary artery without angina pectoris: Secondary | ICD-10-CM | POA: Diagnosis not present

## 2021-06-04 DIAGNOSIS — R531 Weakness: Secondary | ICD-10-CM | POA: Diagnosis present

## 2021-06-04 LAB — MAGNESIUM: Magnesium: 2 mg/dL (ref 1.7–2.4)

## 2021-06-04 LAB — CBC WITH DIFFERENTIAL/PLATELET
Abs Immature Granulocytes: 0.03 10*3/uL (ref 0.00–0.07)
Basophils Absolute: 0.1 10*3/uL (ref 0.0–0.1)
Basophils Relative: 1 %
Eosinophils Absolute: 0.4 10*3/uL (ref 0.0–0.5)
Eosinophils Relative: 4 %
HCT: 35.3 % — ABNORMAL LOW (ref 36.0–46.0)
Hemoglobin: 10.8 g/dL — ABNORMAL LOW (ref 12.0–15.0)
Immature Granulocytes: 0 %
Lymphocytes Relative: 17 %
Lymphs Abs: 1.7 10*3/uL (ref 0.7–4.0)
MCH: 27.2 pg (ref 26.0–34.0)
MCHC: 30.6 g/dL (ref 30.0–36.0)
MCV: 88.9 fL (ref 80.0–100.0)
Monocytes Absolute: 1.2 10*3/uL — ABNORMAL HIGH (ref 0.1–1.0)
Monocytes Relative: 12 %
Neutro Abs: 6.5 10*3/uL (ref 1.7–7.7)
Neutrophils Relative %: 66 %
Platelets: 838 10*3/uL — ABNORMAL HIGH (ref 150–400)
RBC: 3.97 MIL/uL (ref 3.87–5.11)
RDW: 19.3 % — ABNORMAL HIGH (ref 11.5–15.5)
WBC: 9.8 10*3/uL (ref 4.0–10.5)
nRBC: 0 % (ref 0.0–0.2)

## 2021-06-04 LAB — COMPREHENSIVE METABOLIC PANEL
ALT: 34 U/L (ref 0–44)
AST: 33 U/L (ref 15–41)
Albumin: 2.7 g/dL — ABNORMAL LOW (ref 3.5–5.0)
Alkaline Phosphatase: 88 U/L (ref 38–126)
Anion gap: 10 (ref 5–15)
BUN: 11 mg/dL (ref 8–23)
CO2: 30 mmol/L (ref 22–32)
Calcium: 7.8 mg/dL — ABNORMAL LOW (ref 8.9–10.3)
Chloride: 100 mmol/L (ref 98–111)
Creatinine, Ser: 1.15 mg/dL — ABNORMAL HIGH (ref 0.44–1.00)
GFR, Estimated: 52 mL/min — ABNORMAL LOW (ref >60)
Glucose, Bld: 182 mg/dL — ABNORMAL HIGH (ref 70–99)
Potassium: 2.5 mmol/L — CL (ref 3.5–5.1)
Sodium: 140 mmol/L (ref 135–145)
Total Bilirubin: 1 mg/dL (ref 0.3–1.2)
Total Protein: 5.9 g/dL — ABNORMAL LOW (ref 6.5–8.1)

## 2021-06-04 LAB — BRAIN NATRIURETIC PEPTIDE: B Natriuretic Peptide: 362.3 pg/mL — ABNORMAL HIGH (ref 0.0–100.0)

## 2021-06-04 LAB — CBG MONITORING, ED: Glucose-Capillary: 160 mg/dL — ABNORMAL HIGH (ref 70–99)

## 2021-06-04 MED ORDER — LEVOTHYROXINE SODIUM 25 MCG PO TABS
125.0000 ug | ORAL_TABLET | Freq: Every day | ORAL | Status: DC
  Administered 2021-06-05: 125 ug via ORAL
  Filled 2021-06-04: qty 1

## 2021-06-04 MED ORDER — ALBUTEROL SULFATE (2.5 MG/3ML) 0.083% IN NEBU
5.0000 mg | INHALATION_SOLUTION | Freq: Once | RESPIRATORY_TRACT | Status: AC
Start: 2021-06-04 — End: 2021-06-04
  Administered 2021-06-04: 5 mg via RESPIRATORY_TRACT
  Filled 2021-06-04: qty 6

## 2021-06-04 MED ORDER — INSULIN ASPART 100 UNIT/ML FLEXPEN: 3.0000 [IU] | PEN_INJECTOR | SUBCUTANEOUS | Status: DC

## 2021-06-04 MED ORDER — APIXABAN 5 MG PO TABS
5.0000 mg | ORAL_TABLET | Freq: Two times a day (BID) | ORAL | Status: DC
Start: 2021-06-04 — End: 2021-06-05
  Administered 2021-06-04: 5 mg via ORAL
  Filled 2021-06-04: qty 1

## 2021-06-04 MED ORDER — METOCLOPRAMIDE HCL 10 MG PO TABS: 5.0000 mg | ORAL_TABLET | Freq: Three times a day (TID) | ORAL | Status: DC | PRN

## 2021-06-04 MED ORDER — FUROSEMIDE 20 MG PO TABS: 20.0000 mg | ORAL_TABLET | Freq: Every day | ORAL | Status: DC | PRN

## 2021-06-04 MED ORDER — PANTOPRAZOLE SODIUM 40 MG PO TBEC
40.0000 mg | DELAYED_RELEASE_TABLET | Freq: Two times a day (BID) | ORAL | Status: DC
  Administered 2021-06-05: 40 mg via ORAL
  Filled 2021-06-04: qty 1

## 2021-06-04 MED ORDER — ATORVASTATIN CALCIUM 40 MG PO TABS
40.0000 mg | ORAL_TABLET | Freq: Every evening | ORAL | Status: DC
Start: 2021-06-04 — End: 2021-06-05
  Administered 2021-06-04: 40 mg via ORAL
  Filled 2021-06-04: qty 1

## 2021-06-04 MED ORDER — POTASSIUM CHLORIDE 10 MEQ/100ML IV SOLN
10.0000 meq | Freq: Once | INTRAVENOUS | Status: AC
  Administered 2021-06-04: 10 meq via INTRAVENOUS
  Filled 2021-06-04: qty 100

## 2021-06-04 MED ORDER — INSULIN ASPART 100 UNIT/ML IJ SOLN
0.0000 [IU] | INTRAMUSCULAR | Status: DC
  Administered 2021-06-04: 2 [IU] via SUBCUTANEOUS

## 2021-06-04 MED ORDER — POTASSIUM CHLORIDE CRYS ER 20 MEQ PO TBCR
40.0000 meq | EXTENDED_RELEASE_TABLET | Freq: Two times a day (BID) | ORAL | Status: DC
  Administered 2021-06-05: 40 meq via ORAL
  Filled 2021-06-04: qty 2

## 2021-06-04 MED ORDER — POTASSIUM CHLORIDE CRYS ER 20 MEQ PO TBCR
40.0000 meq | EXTENDED_RELEASE_TABLET | Freq: Once | ORAL | Status: AC
  Administered 2021-06-04: 40 meq via ORAL
  Filled 2021-06-04: qty 2

## 2021-06-04 MED ORDER — DAPAGLIFLOZIN PROPANEDIOL 10 MG PO TABS
10.0000 mg | ORAL_TABLET | Freq: Every morning | ORAL | Status: DC
  Filled 2021-06-04: qty 1

## 2021-06-04 MED ORDER — PROMETHAZINE HCL 25 MG PO TABS: 12.5000 mg | ORAL_TABLET | Freq: Four times a day (QID) | ORAL | Status: DC | PRN

## 2021-06-04 MED ORDER — FERROUS SULFATE 325 (65 FE) MG PO TABS
325.0000 mg | ORAL_TABLET | Freq: Every evening | ORAL | Status: DC
  Administered 2021-06-04: 325 mg via ORAL
  Filled 2021-06-04: qty 1

## 2021-06-04 MED ORDER — POLYETHYLENE GLYCOL 3350 17 G PO PACK: 17.0000 g | PACK | Freq: Every day | ORAL | Status: DC

## 2021-06-04 MED ORDER — MIDODRINE HCL 5 MG PO TABS
5.0000 mg | ORAL_TABLET | Freq: Two times a day (BID) | ORAL | Status: DC
  Administered 2021-06-05: 5 mg via ORAL
  Filled 2021-06-04: qty 1

## 2021-06-04 MED ORDER — INSULIN GLARGINE-YFGN 100 UNIT/ML ~~LOC~~ SOLN
42.0000 [IU] | Freq: Every day | SUBCUTANEOUS | Status: DC
  Administered 2021-06-05: 42 [IU] via SUBCUTANEOUS
  Filled 2021-06-04 (×2): qty 0.42

## 2021-06-04 MED ORDER — ACETAMINOPHEN 500 MG PO TABS
500.0000 mg | ORAL_TABLET | Freq: Three times a day (TID) | ORAL | Status: DC | PRN
  Administered 2021-06-05: 1000 mg via ORAL
  Filled 2021-06-04: qty 2

## 2021-06-04 MED ORDER — POLYVINYL ALCOHOL 1.4 % OP SOLN
1.0000 [drp] | Freq: Every day | OPHTHALMIC | Status: DC | PRN
  Filled 2021-06-04: qty 15

## 2021-06-04 MED ORDER — FUROSEMIDE 20 MG PO TABS
20.0000 mg | ORAL_TABLET | Freq: Every day | ORAL | Status: DC
  Administered 2021-06-05: 20 mg via ORAL
  Filled 2021-06-04: qty 1

## 2021-06-04 NOTE — Progress Notes (Signed)
Transition of Care [facility]) - Emergency Department Mini Assessment ? ? ?Patient Details  ?Name: Tammy Good ?MRN: [id number] ?Date of Birth: 05/03/1950 ? ?Transition of Care (TOC) CM/SW Contact:    ?Laurena Slimmer, RN ?Phone Number: ?06/04/2021, 5:13 PM ? ? ?Clinical Narrative: ? ? ? ?ED Mini Assessment: ?What brought you to the Emergency Department? : (Pended) Patient lives alone and was discharged yesterday after a lengthy hospital stay.   Patient thought she would be able to manage but is increasingly weak and unable yto care for herself. Discussed option for SNF level of care patient is agreeable.  Discussed the process for SNF placement from ED patient verbalized understanding. PT consult placed. Updated Dr. Lavone Nian RN. ED TOC SW will follow up with patient. ? ?  ? ?  ? ?  ? ?Interventions which prevented an admission or readmission: (Pended) Other (must enter comment) (SNF palcement appropriate) ? ? ? ?Patient Contact and Communications ?  ?  ?  ? ,     ?  ?  ? ?Patient states their goals for this hospitalization and ongoing recovery are:: (Pended) Patient stated  'I want to get bettter to care for myself" ?  ?  ? ?Admission diagnosis:  unsteady gait  ?Patient Active Problem List  ? Diagnosis Date Noted  ? Acute on chronic systolic CHF (congestive heart failure) ([place]) 06/02/2021  ? Hypothyroidism 06/02/2021  ? Ulcer of esophagus without bleeding   ? Pressure injury of skin 05/31/2021  ? Hypokalemia 05/30/2021  ? Symptomatic anemia 05/29/2021  ? AKI (acute kidney injury) ([place]) 05/29/2021  ? GI bleed 05/29/2021  ? Liver lesion 05/29/2021  ? Vertigo 05/24/2021  ? Generalized weakness 05/24/2021  ? Protein-calorie malnutrition, severe 04/26/2021  ? Tenosynovitis of left ankle 04/25/2021  ? Sepsis ([place]) 04/24/2021  ? Cellulitis 04/23/2021  ? MRSA bacteremia 04/23/2021  ? Thrombocytosis 04/23/2021  ? Fatty liver 06/19/2020  ? 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  Atherosclerotic heart disease of native coronary artery without angina pectoris 03/20/2020  ? Epigastric pain 03/20/2020  ? Nausea 03/20/2020  ? Absence of toe ([place]) 06/28/2019  ? 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 PAD (peripheral artery disease) ([place]) 05/26/2019  ? Osteomyelitis of left foot ([place]) 05/24/2019  ? Osteomyelitis ([place]) 05/23/2019  ? Cellulitis and abscess of foot, except toes 05/18/2019  ? Chronic osteomyelitis of ankle and foot ([place]) 05/18/2019  ? Cholesteatoma 02/24/2019  ? Cellulitis and abscess of toe 11/09/2018  ? Colon cancer screening 11/30/2017  ? Long term (current) use of insulin ([place]) 03/05/2017  ? Iron deficiency anemia 12/10/2014  ? Obesity (BMI 30-39.9) 11/17/2013  ? Diabetes mellitus, type 2 ([place])   ? Essential hypertension   ? Right thigh pain 11/01/2013  ? PVC's (premature ventricular contractions) 11/01/2013  ? Status post insertion of drug eluting coronary artery stent to [facility] emergently and PTCA to prox. PLA 09/16/2013  ? Hyperlipidemia associated with type 2 diabetes mellitus ([place]) 09/16/2013  ? Presence of coronary angioplasty implant and graft 09/08/2013  ? 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 Non-ST elevation (NSTEMI) myocardial infarction ([place]) 08/22/2013  ? CAD S/P percutaneous coronary angioplasty 08/22/2013  ? ?PCP:  [name], MD ?Pharmacy:   ?[facility] ([place]), [place] - [address] ?[address] ?[place] [address] ?Phone: [phone] Fax: [phone] ? ?[place] ?[place] [address] ?Phone: [phone] Fax: [phone] ? ?[facility] ?[address] ?[place] [address] ?Phone: [phone] Fax: [phone] ?  ?

## 2021-06-04 NOTE — [place] FL2 (Signed)
?[place] MEDICAID FL2 LEVEL OF CARE SCREENING TOOL  ?  ? ?IDENTIFICATION  ?Patient Name: ?Tammy Good Birthdate: 24-Jun-1950 Sex: female Admission Date (Current Location): ?06/04/2021  ?[place] and [place] Number: ? [place] ?  Facility and Address:  ?[facility], [place] [address] [place], [place] [address] ?     Provider Number: ?[id number]  ?Attending Physician Name and Address:  ?Sherwood Gambler, MD ? Relative Name and Phone Number:  ?Remus Blake Daughter   [phone] ?   ?Current Level of Care: ?Hospital Recommended Level of Care: ?[place] Prior Approval Number: ?  ? ?Date Approved/Denied: ?  PASRR Number: ?[id number] A ? ?Discharge Plan: ?SNF ?  ? ?Current Diagnoses: ?Patient Active Problem List  ? Diagnosis Date Noted  ? Acute on chronic systolic CHF (congestive heart failure) ([place]) 06/02/2021  ? Hypothyroidism 06/02/2021  ? Ulcer of esophagus without bleeding   ? Pressure injury of skin 05/31/2021  ? Hypokalemia 05/30/2021  ? Symptomatic anemia 05/29/2021  ? AKI (acute kidney injury) ([place]) 05/29/2021  ? GI bleed 05/29/2021  ? Liver lesion 05/29/2021  ? Vertigo 05/24/2021  ? Generalized weakness 05/24/2021  ? Protein-calorie malnutrition, severe 04/26/2021  ? Tenosynovitis of left ankle 04/25/2021  ? Sepsis ([place]) 04/24/2021  ? Cellulitis 04/23/2021  ? MRSA bacteremia 04/23/2021  ? Thrombocytosis 04/23/2021  ? Fatty liver 06/19/2020  ? 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 Atherosclerotic heart disease of native coronary artery without angina pectoris 03/20/2020  ? Epigastric pain 03/20/2020  ? Nausea 03/20/2020  ? Absence of toe ([place]) 06/28/2019  ? 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 PAD (peripheral artery disease) ([place]) 05/26/2019  ? Osteomyelitis of left foot ([place]) 05/24/2019  ? Osteomyelitis ([place]) 05/23/2019  ? Cellulitis and abscess of foot, except toes 05/18/2019  ? Chronic osteomyelitis of ankle and  foot ([place]) 05/18/2019  ? Cholesteatoma 02/24/2019  ? Cellulitis and abscess of toe 11/09/2018  ? Colon cancer screening 11/30/2017  ? Long term (current) use of insulin ([place]) 03/05/2017  ? Iron deficiency anemia 12/10/2014  ? Obesity (BMI 30-39.9) 11/17/2013  ? Diabetes mellitus, type 2 ([place])   ? Essential hypertension   ? Right thigh pain 11/01/2013  ? PVC's (premature ventricular contractions) 11/01/2013  ? Status post insertion of drug eluting coronary artery stent to [facility] emergently and PTCA to prox. PLA 09/16/2013  ? Hyperlipidemia associated with type 2 diabetes mellitus ([place]) 09/16/2013  ? Presence of coronary angioplasty implant and graft 09/08/2013  ? 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 Non-ST elevation (NSTEMI) myocardial infarction ([place]) 08/22/2013  ? CAD S/P percutaneous coronary angioplasty 08/22/2013  ? ? ?Orientation RESPIRATION BLADDER Height & Weight   ?  ?Self, Time, Situation, Place ? Normal Continent Weight:   ?Height:     ?BEHAVIORAL SYMPTOMS/MOOD NEUROLOGICAL BOWEL NUTRITION STATUS  ?    Continent Diet (diabetic friendly)  ?AMBULATORY STATUS COMMUNICATION OF NEEDS Skin   ?Extensive Assist Verbally Normal ?  ?  ?  ?    ?     ?     ? ? ?Personal Care Assistance Level of Assistance  ?Bathing, Feeding, Dressing Bathing Assistance: Limited assistance ?Feeding assistance: Independent ?Dressing Assistance: Limited assistance ?   ? ?Functional Limitations Info  ?Sight, Hearing, Speech Sight Info: Impaired (wears glasses) ?Hearing Info: Adequate ?Speech Info: Adequate  ? ? ?SPECIAL CARE FACTORS FREQUENCY  ?PT (By licensed PT), OT (By licensed OT)   ?  ?PT Frequency: 5x weekly ?  OT Frequency: 5x weekly ?  ?  ?  ?   ? ? ?Contractures Contractures Info: Not present  ? ? ?Additional Factors Info  ?Code Status, Allergies Code Status Info: Full ?Allergies Info: Strawberry extraxt ?  ?  ?  ?   ? ?Current Medications (06/04/2021):  This is the current hospital active medication list ?No current  facility-administered medications for this encounter.  ? ?Current Outpatient Medications  ?Medication Sig Dispense Refill  ? acetaminophen (TYLENOL) 500 MG tablet Take 500-1,000 mg by mouth 3 (three) times daily as needed for headache (pain).    ? apixaban (ELIQUIS) 5 MG TABS tablet Take 1 tablet (5 mg total) by mouth 2 (two) times daily. 60 tablet 6  ? atorvastatin (LIPITOR) 40 MG tablet Take 1 tablet (40 mg total) by mouth daily at 6 PM. (Patient taking differently: Take 40 mg by mouth every evening.) 30 tablet 0  ? B-D UF III MINI PEN NEEDLES 31G X 5 MM MISC Inject into the skin.    ? Continuous Blood Gluc Sensor (FREESTYLE LIBRE 2 SENSOR) MISC Apply topically every 14 (fourteen) days.    ? dapagliflozin propanediol (FARXIGA) 10 MG TABS tablet Take 10 mg by mouth every morning.    ? dexamethasone (DECADRON) 0.5 MG tablet Take 0.5 mg by mouth every morning.    ? ferrous sulfate 325 (65 FE) MG tablet Take 325 mg by mouth every evening.    ? furosemide (LASIX) 20 MG tablet Take 1 tablet (20 mg total) by mouth daily as needed for fluid or edema. 30 tablet 0  ? insulin aspart (NOVOLOG FLEXPEN) 100 UNIT/ML FlexPen Inject 3-8 Units into the skin See admin instructions. Inject 3-8 units subcutaneously up to three times daily per sliding scale - based on carb count    ? Insulin Glargine (BASAGLAR KWIKPEN) 100 UNIT/ML SOPN Inject 42 Units into the skin at bedtime.    ? levothyroxine (SYNTHROID) 125 MCG tablet Take 1 tablet (125 mcg total) by mouth daily before breakfast. 30 tablet 2  ? metoCLOPramide (REGLAN) 5 MG tablet Take 1 tablet (5 mg total) by mouth 3 (three) times daily as needed for nausea or vomiting. Take before meals as needed, if having nausea 90 tablet 1  ? midodrine (PROAMATINE) 5 MG tablet Take 1 tablet (5 mg total) by mouth 2 (two) times daily with a meal. 60 tablet 1  ? nitroGLYCERIN (NITROSTAT) 0.4 MG SL tablet Place 1 tablet (0.4 mg total) under the tongue every 5 (five) minutes as needed for chest pain  (up to 3 doses. If taking 3rd dose call 911). 25 tablet 3  ? nystatin cream (MYCOSTATIN) Apply 1 application topically 2 (two) times daily. (Patient taking differently: Apply 1 application topically 2 (two) times daily as needed (rash).) 30 g 1  ? Omega-3 Fatty Acids (FISH OIL) 1000 MG CAPS Take 1,000 mg by mouth every morning.    ? pantoprazole (PROTONIX) 40 MG tablet Take 1 tablet (40 mg total) by mouth 2 (two) times daily before a meal. 60 tablet 0  ? polyethylene glycol powder (GLYCOLAX/MIRALAX) 17 GM/SCOOP powder Take 1 capful with water (17 g) by mouth daily. 238 g 0  ? promethazine (PHENERGAN) 12.5 MG tablet Take 1 tablet (12.5 mg total) by mouth every 6 (six) hours as needed for nausea, vomiting or refractory nausea / vomiting. 30 tablet 0  ? Propylene Glycol (SYSTANE COMPLETE) 0.6 % SOLN Place 1 drop into both eyes daily as needed (dry eyes).    ? ? ? ?  Discharge Medications: ?Please see discharge summary for a list of discharge medications. ? ?Relevant Imaging Results: ? ?Relevant Lab Results: ? ? ?Additional Information ?SSN# [id number]/ Pt has had 4 Covid shots/ Ht& Wt 5'7" 212 lbs ? ?Steven Veazie, LCSW ? ? ? ? ?

## 2021-06-04 NOTE — Progress Notes (Signed)
CSW sent FL2 referral to several area SNFs. ?Awaiting PT eval ?

## 2021-06-04 NOTE — ED Triage Notes (Addendum)
Pt. Stated I was discharge yesterday for numerous stuff. I had a low HGB, weak,  I just didn't realize how difficult it would be at my house to maneuver around.I need to get some help with a rehab place where someone can help me.  I was peeing and pooping on myself. ?

## 2021-06-04 NOTE — ED Provider Notes (Addendum)
?[facility] ?Provider Note ? ? ?CSN: [id number] ?Arrival date & time: 06/04/21  1217 ? ?  ? ?History ? ?Chief Complaint  ?Patient presents with  ? unable to walk  ? Weakness  ? ? ?Tammy Good is a 71 y.o. female. ? ?HPI ?71 year old female presents with wanting to get to rehab.  She states she was just discharged from the hospital yesterday.  Since then, she got on the toilet yesterday and did not have the strength to get herself off.  Had to slide down to the ground and then crawl.  Eventually was able to get to a chair and slept all night but had to have bowel movements and urination in the chair.  Eventually her sister was able to help her change close and clean her up.  She came here because she does not feel like she can manage at home and needs rehab.  Feels like both legs are just weak and cannot support her.  She states that she feels like she got deconditioned in the hospital. ? ?She was just admitted for symptomatic anemia, AKI, and had blood transfusions.  She has multiple comorbidities including CAD, diabetes, PAD, GI bleeding, CHF. ? ?She does tell me that today she is feeling short of breath, wheezy, and having a cough.  No chest pain.  Some leg swelling to both legs today. ? ?Home Medications ?Prior to Admission medications   ?Medication Sig Start Date End Date Taking? Authorizing Provider  ?acetaminophen (TYLENOL) 500 MG tablet Take 500-1,000 mg by mouth 3 (three) times daily as needed for headache (pain).    [provider]  ?apixaban (ELIQUIS) 5 MG TABS tablet Take 1 tablet (5 mg total) by mouth 2 (two) times daily. 11/21/20   Baglia, Corrina, PA-C  ?atorvastatin (LIPITOR) 40 MG tablet Take 1 tablet (40 mg total) by mouth daily at 6 PM. ?Patient taking differently: Take 40 mg by mouth every evening. 09/03/13   Cherene Altes, MD  ?B-D UF III MINI PEN NEEDLES 31G X 5 MM MISC Inject into the skin. 05/28/20   [provider]  ?Continuous  Blood Gluc Sensor (FREESTYLE LIBRE 2 SENSOR) MISC Apply topically every 14 (fourteen) days. 06/12/20   [provider]  ?dapagliflozin propanediol (FARXIGA) 10 MG TABS tablet Take 10 mg by mouth every morning.    [provider]  ?dexamethasone (DECADRON) 0.5 MG tablet Take 0.5 mg by mouth every morning. 09/03/13   Cherene Altes, MD  ?ferrous sulfate 325 (65 FE) MG tablet Take 325 mg by mouth every evening.    [provider]  ?furosemide (LASIX) 20 MG tablet Take 1 tablet (20 mg total) by mouth daily as needed for fluid or edema. 06/03/21 06/03/22  Rai, Vernelle Emerald, MD  ?insulin aspart (NOVOLOG FLEXPEN) 100 UNIT/ML FlexPen Inject 3-8 Units into the skin See admin instructions. Inject 3-8 units subcutaneously up to three times daily per sliding scale - based on carb count    [provider]  ?Insulin Glargine (BASAGLAR KWIKPEN) 100 UNIT/ML SOPN Inject 42 Units into the skin at bedtime. 07/11/15   [provider]  ?levothyroxine (SYNTHROID) 125 MCG tablet Take 1 tablet (125 mcg total) by mouth daily before breakfast. 11/21/20   Baglia, Corrina, PA-C  ?metoCLOPramide (REGLAN) 5 MG tablet Take 1 tablet (5 mg total) by mouth 3 (three) times daily as needed for nausea or vomiting. Take before meals as needed, if having nausea 06/03/21 06/03/22  Rai, Ripudeep  K, MD  ?midodrine (PROAMATINE) 5 MG tablet Take 1 tablet (5 mg total) by mouth 2 (two) times daily with a meal. 06/03/21   Rai, Ripudeep K, MD  ?nitroGLYCERIN (NITROSTAT) 0.4 MG SL tablet Place 1 tablet (0.4 mg total) under the tongue every 5 (five) minutes as needed for chest pain (up to 3 doses. If taking 3rd dose call 911). 11/21/20   Dunn, Nedra Hai, PA-C  ?nystatin cream (MYCOSTATIN) Apply 1 application topically 2 (two) times daily. ?Patient taking differently: Apply 1 application topically 2 (two) times daily as needed (rash). 08/29/19   Carlyle Basques, MD  ?Omega-3 Fatty Acids (FISH OIL) 1000 MG CAPS Take 1,000 mg by mouth  every morning.    [provider]  ?pantoprazole (PROTONIX) 40 MG tablet Take 1 tablet (40 mg total) by mouth 2 (two) times daily before a meal. 09/03/13   Cherene Altes, MD  ?polyethylene glycol powder (GLYCOLAX/MIRALAX) 17 GM/SCOOP powder Take 1 capful with water (17 g) by mouth daily. 06/04/21   Rai, Vernelle Emerald, MD  ?promethazine (PHENERGAN) 12.5 MG tablet Take 1 tablet (12.5 mg total) by mouth every 6 (six) hours as needed for nausea, vomiting or refractory nausea / vomiting. 06/03/21   Rai, Vernelle Emerald, MD  ?Propylene Glycol (SYSTANE COMPLETE) 0.6 % SOLN Place 1 drop into both eyes daily as needed (dry eyes).    [provider]  ?   ? ?Allergies    ?Strawberry extract   ? ?Review of Systems   ?Review of Systems  ?Constitutional:  Negative for fever.  ?Respiratory:  Positive for cough, shortness of breath and wheezing.   ?Cardiovascular:  Positive for leg swelling. Negative for chest pain.  ?Neurological:  Positive for weakness.  ? ?Physical Exam ?Updated Vital Signs ?BP (!) 106/55   Pulse 97   Temp 97.7 ?F (36.5 ?C) (Oral)   Resp 20   SpO2 95%  ?Physical Exam ?Vitals and nursing note reviewed.  ?Constitutional:   ?   Appearance: She is well-developed. She is obese. She is not diaphoretic.  ?HENT:  ?   Head: Normocephalic and atraumatic.  ?Cardiovascular:  ?   Rate and Rhythm: Regular rhythm. Tachycardia present.  ?   Heart sounds: Normal heart sounds.  ?   Comments: HR~ 100 ?Pulmonary:  ?   Effort: Pulmonary effort is normal.  ?   Breath sounds: Wheezing (diffuse, expiratory) present.  ?Abdominal:  ?   Palpations: Abdomen is soft.  ?   Tenderness: There is no abdominal tenderness.  ?Musculoskeletal:  ?   Comments: Mild ankle edema bilaterally  ?Skin: ?   General: Skin is warm and dry.  ?Neurological:  ?   Mental Status: She is alert.  ?   Comments: Oriented to person, place, date. 5/5 strength in BUE. 4/5 strength in BLE.   ? ? ?ED Results / Procedures / Treatments   ?Labs ?(all labs  ordered are listed, but only abnormal results are displayed) ?Labs Reviewed  ?COMPREHENSIVE METABOLIC PANEL - Abnormal; Notable for the following components:  ?    Result Value  ? Potassium 2.5 (*)   ? Glucose, Bld 182 (*)   ? Creatinine, Ser 1.15 (*)   ? Calcium 7.8 (*)   ? Total Protein 5.9 (*)   ? Albumin 2.7 (*)   ? GFR, Estimated 52 (*)   ? All other components within normal limits  ?CBC WITH DIFFERENTIAL/PLATELET - Abnormal; Notable for the following components:  ? Hemoglobin 10.8 (*)   ?  HCT 35.3 (*)   ? RDW 19.3 (*)   ? Platelets 838 (*)   ? Monocytes Absolute 1.2 (*)   ? All other components within normal limits  ?BRAIN NATRIURETIC PEPTIDE - Abnormal; Notable for the following components:  ? B Natriuretic Peptide 362.3 (*)   ? All other components within normal limits  ?MAGNESIUM  ?URINALYSIS, ROUTINE W REFLEX MICROSCOPIC  ? ? ?EKG ?EKG Interpretation ? ?Date/Time:  Tuesday June 04 2021 17:23:06 EDT ?Ventricular Rate:  97 ?PR Interval:  157 ?QRS Duration: 109 ?QT Interval:  388 ?QTC Calculation: 493 ?R Axis:   230 ?Text Interpretation: Sinus rhythm Atrial premature complex Probable right ventricular hypertrophy Inferior infarct, old Lateral leads are also involved no significant change since May 24 2021 Confirmed by Sherwood Gambler [phone]) on 06/04/2021 5:27:34 PM ? ?Radiology ?DG Chest 2 View ? ?Result Date: 06/04/2021 ?CLINICAL DATA:  Difficulty breathing and wheezing. EXAM: CHEST - 2 VIEW COMPARISON:  08/02/2021 FINDINGS: The heart is mildly enlarged but stable. Stable tortuosity and calcification of the thoracic aorta. Low lung volumes with vascular crowding and streaky atelectasis. Increased interstitial markings could suggest interstitial edema. No definite pleural effusions. IMPRESSION: 1. Low lung volumes with vascular crowding and streaky atelectasis. 2. Suspect mild interstitial edema. Electronically Signed   By: Marijo Sanes M.D.   On: 06/04/2021 18:38   ? ?Procedures ?Procedures  ? ? ?Medications  Ordered in ED ?Medications  ?potassium chloride SA (KLOR-CON M) CR tablet 40 mEq (has no administration in time range)  ?acetaminophen (TYLENOL) tablet 500-1,000 mg (has no administration in time range)  ?apix

## 2021-06-05 DIAGNOSIS — E876 Hypokalemia: Secondary | ICD-10-CM | POA: Diagnosis not present

## 2021-06-05 LAB — CBG MONITORING, ED: Glucose-Capillary: 104 mg/dL — ABNORMAL HIGH (ref 70–99)

## 2021-06-05 LAB — URINALYSIS, ROUTINE W REFLEX MICROSCOPIC
Bilirubin Urine: NEGATIVE
Glucose, UA: NEGATIVE mg/dL
Hgb urine dipstick: NEGATIVE
Ketones, ur: NEGATIVE mg/dL
Nitrite: NEGATIVE
Protein, ur: NEGATIVE mg/dL
Specific Gravity, Urine: 1.014 (ref 1.005–1.030)
pH: 5 (ref 5.0–8.0)

## 2021-06-05 NOTE — ED Notes (Signed)
Breakfast order placed ?

## 2021-06-05 NOTE — Progress Notes (Signed)
Patient is going to room 164 at [facility]. CSW gave patients nurse the number to call report [phone] ?

## 2021-06-05 NOTE — ED Notes (Signed)
Patient switched to hospital bed

## 2021-06-05 NOTE — Evaluation (Signed)
Physical Therapy Evaluation ?Patient Details ?Name: Tammy Good ?MRN: [id number] ?DOB: [birth year] ?Today's Date: 06/05/2021 ? ?History of Present Illness ? 71 yo F presenting to ED on 06/04/21 after recent long admission at [facility] for generalized weakness, N/V, and poor intake. Pt returned to ED because she was unable to care for herself at home. Imaging (+) for L pleural effusion and consolidation and low lung volumes. PMH includes HTN, CAD s/p PCI with stent, DM2, PAD, CHF, A-fib, AKI, vertigo, and L ankle infection. ?  ?Clinical Impression ? Patient presented to the ED on 06/04/21 with generalized weakness. Pt's impairments include decreased strength, power, balance, decreased knowledge of use of RW, and increased fatigue with mobility. These impairments are limiting her ability to safely and independently transfer, ambulate, and navigate stairs. Patient requires min guard to min A for med mobility and mod Ax2 for transfers and ambulation with RW. Pt complained of dizziness with mobility. BP below. RN notified. SPT recommending SNF upon D/C due to mobility deficits. PT will continue to follow acutely to maximize pt's independence and safety with functional mobility. ?   ?BP in sitting: 115/67 ?BP after ambulating: 113/59 ?BP after 2nd sit to stand: 125/59  ? ?Recommendations for follow up therapy are one component of a multi-disciplinary discharge planning process, led by the attending physician.  Recommendations may be updated based on patient status, additional functional criteria and insurance authorization. ? ?Follow Up Recommendations Skilled nursing-short term rehab (<3 hours/day) ? ?  ?Assistance Recommended at Discharge Frequent or constant Supervision/Assistance  ?Patient can return home with the following ? A little help with bathing/dressing/bathroom;Assistance with cooking/housework;Direct supervision/assist for medications management;Direct supervision/assist for financial management;Assist for  transportation;Help with stairs or ramp for entrance;A lot of help with walking and/or transfers ? ?  ?Equipment Recommendations None recommended by PT  ?Recommendations for Other Services ?    ?  ?Functional Status Assessment Patient has had a recent decline in their functional status and demonstrates the ability to make significant improvements in function in a reasonable and predictable amount of time.  ? ?  ?Precautions / Restrictions Precautions ?Precautions: Fall ?Restrictions ?Weight Bearing Restrictions: No  ? ?  ? ?Mobility ? Bed Mobility ?Overal bed mobility: Needs Assistance ?Bed Mobility: Supine to Sit, Sit to Supine ?  ?  ?Supine to sit: Min guard ?Sit to supine: Min assist ?  ?General bed mobility comments: min guard required for safety as pt sat up on EOB. Pt required increased time and cues for hand placement and to push through her R elbow to elevate trunk. Pt was dizzy upon sitting. BP 115/59. Required min A for LE management with return to supine ?  ? ?Transfers ?Overall transfer level: Needs assistance ?Equipment used: Rolling walker (2 wheels) ?Transfers: Sit to/from Stand ?Sit to Stand: Mod assist, +2 physical assistance, +2 safety/equipment ?  ?  ?  ?  ?  ?General transfer comment: cues for hand placement and use of RW. Pt tried 3 times to stand with minimal forward trunk lean and no hip clearance with min Ax1. Pt required mod Ax2 for lifting and steadying to stand. Pt required cues for standing erect due to initial heavy forward trunk lean onto RW. Pt able to take steps forward and backward at EOB (see general gait comments). Upon 2nd sit to stand attempt with cues for hand placement and forward trunk lean, pt still required mod Ax2 with inability to stand erect due to fatigue. Further mobility deferred for safety. BP after  2nd attempt of standing 125/59. ?  ? ?Ambulation/Gait ?Ambulation/Gait assistance: Mod assist, +2 safety/equipment ?Gait Distance (Feet): 2 Feet ?Assistive device: Rolling  walker (2 wheels) ?Gait Pattern/deviations: Step-to pattern, Antalgic ?Gait velocity: decreased ?Gait velocity interpretation: <1.31 ft/sec, indicative of household ambulator ?  ?General Gait Details: pt required mod A for support and steadying for taking steps forward and back at EOB. Pt's legs became increasingly shaky in standing and pt complained of dizziness. Pt BP following steps 113/59. ? ?Stairs ?  ?  ?  ?  ?  ? ?Wheelchair Mobility ?  ? ?Modified Rankin (Stroke Patients Only) ?  ? ?  ? ?Balance Overall balance assessment: Needs assistance ?  ?Sitting balance-Leahy Scale: Good ?  ?  ?Standing balance support: Bilateral upper extremity supported, During functional activity, Reliant on assistive device for balance ?Standing balance-Leahy Scale: Poor ?Standing balance comment: heavy reliance on BUE for support with RW and mod A for steadying ?  ?  ?  ?  ?  ?  ?  ?  ?  ?  ?  ?   ? ? ? ?Pertinent Vitals/Pain Pain Assessment ?Pain Assessment: Faces ?Faces Pain Scale: No hurt  ? ? ?Home Living Family/patient expects to be discharged to:: Private residence ?Living Arrangements: Alone ?Available Help at Discharge: Family;Available PRN/intermittently ?Type of Home: House ?Home Access: Stairs to enter ?Entrance Stairs-Rails: Left ?Entrance Stairs-Number of Steps: 3-4 at the front, 4 through the garage (typical entrance) ?  ?Home Layout: One level ?Home Equipment: Shower seat;Cane - single point;Rolling Walker (2 wheels);Hand held shower head ?Additional Comments: pt unable to get off toilet by herself with RW in front of her.  ?  ?Prior Function Prior Level of Function : Independent/Modified Independent;Driving ?  ?  ?  ?  ?  ?  ?Mobility Comments: uses cane most of the time (what she brought with her to ED); however, reports she has been using the RW in her house. ?ADLs Comments: sits to shower ?  ? ? ?Hand Dominance  ? Dominant Hand: Right ? ?  ?Extremity/Trunk Assessment  ? Upper Extremity Assessment ?Upper  Extremity Assessment: Generalized weakness ?  ? ?Lower Extremity Assessment ?Lower Extremity Assessment: Generalized weakness;RLE deficits/detail;LLE deficits/detail ?RLE Deficits / Details: Pt had BLE shakiness with mobility tasks. Scabbed over wound on R anterior calf ~2 inches long ?LLE Deficits / Details: Pt had BLE shakiness during mobility ?  ? ?Cervical / Trunk Assessment ?Cervical / Trunk Assessment: Normal  ?Communication  ? Communication: No difficulties  ?Cognition Arousal/Alertness: Awake/alert ?Behavior During Therapy: [facility] for tasks assessed/performed ?Overall Cognitive Status: Within Functional Limits for tasks assessed ?  ?  ?  ?  ?  ?  ?  ?  ?  ?  ?  ?  ?  ?  ?  ?  ?General Comments: pt groggy during assessment. ?  ?  ? ?  ?General Comments   ? ?  ?Exercises    ? ?Assessment/Plan  ?  ?PT Assessment Patient needs continued PT services  ?PT Problem List Decreased strength;Decreased activity tolerance;Decreased range of motion;Decreased balance;Decreased mobility;Decreased coordination;Decreased knowledge of use of DME;Decreased safety awareness ? ?   ?  ?PT Treatment Interventions DME instruction;Gait training;Stair training;Functional mobility training;Therapeutic activities;Therapeutic exercise;Balance training;Neuromuscular re-education;Patient/family education   ? ?PT Goals (Current goals can be found in the Care Plan section)  ?Acute Rehab PT Goals ?Patient Stated Goal: to go to a SNF ?PT Goal Formulation: With patient ?Time For Goal Achievement: 06/19/21 ?Potential to  Achieve Goals: Good ? ?  ?Frequency Min 2X/week ?  ? ? ?Co-evaluation   ?  ?  ?  ?  ? ? ?  ?AM-PAC PT "6 Clicks" Mobility  ?Outcome Measure Help needed turning from your back to your side while in a flat bed without using bedrails?: None ?Help needed moving from lying on your back to sitting on the side of a flat bed without using bedrails?: A Little ?Help needed moving to and from a bed to a chair (including a wheelchair)?:  Total ?Help needed standing up from a chair using your arms (e.g., wheelchair or bedside chair)?: Total ?Help needed to walk in hospital room?: Total ?Help needed climbing 3-5 steps with a railing? : Total ?6 Click Score:

## 2021-06-05 NOTE — ED Notes (Signed)
Attempted report, wrong number was given and unable to find number under the new name of facilty  ?

## 2021-06-05 NOTE — ED Notes (Signed)
[facility] called back and report was given to RN ?

## 2021-06-05 NOTE — ED Notes (Signed)
[facility] CALLED; NO ETA GIVEN ?

## 2021-06-05 NOTE — Progress Notes (Signed)
Patient accepted bed at [facility] for SNF.  ?

## 2021-06-11 ENCOUNTER — Ambulatory Visit (INDEPENDENT_AMBULATORY_CARE_PROVIDER_SITE_OTHER): Payer: Medicare Other | Admitting: Podiatry

## 2021-06-11 ENCOUNTER — Telehealth: Payer: Self-pay | Admitting: Podiatry

## 2021-06-11 ENCOUNTER — Other Ambulatory Visit: Payer: Self-pay | Admitting: Podiatry

## 2021-06-11 ENCOUNTER — Other Ambulatory Visit: Payer: Self-pay

## 2021-06-11 DIAGNOSIS — Z9889 Other specified postprocedural states: Secondary | ICD-10-CM

## 2021-06-11 DIAGNOSIS — M86172 Other acute osteomyelitis, left ankle and foot: Secondary | ICD-10-CM

## 2021-06-11 MED ORDER — SANTYL 250 UNIT/GM EX OINT: 1.0000 "application " | TOPICAL_OINTMENT | Freq: Every day | CUTANEOUS | 0 refills | Status: DC

## 2021-06-11 NOTE — Progress Notes (Signed)
Subjective: ?Tammy Good is a 71 y.o. is seen today in office s/p left ankle incision and drainage, bone biopsy, wound debridement preformed on 04/24/2021 with Dr. Cannon Kettle.  States that she recently was transition to a different facility.  Her granddaughter helps change the bandage.  They are concerned that the wound has been opening up.  No increased drainage or pus.  No swelling or redness.  Denies any fevers or chills.  No other concerns. ? ? ?Objective: ?General: No acute distress, AAOx3  ?DP/PT pulses palpable, CRT < 3 sec to all digits.  ?Left ankle: Incision is well coapted without any evidence of dehiscence with sutures intact.  The wound present the proximal aspect incision measures 3 x 0.5 x 0.5 cm there is no probing to bone, or tunneling.  No fluctuation or crepitation for this matter.  There is no drainage or pus noted today.  The wound on the inferior portion incision is closed.  Presently incision is well coapted.  ?No pain with calf compression, swelling, warmth, erythema.  ? ? ? ? ? ?Assessment and Plan:  ?Status post left ankle incision and drainage, bone biopsy, doing well with no complications  ? ?-Treatment options discussed including all alternatives, risks, and complications ?-Reviewed the sutures today without any complications incisions healing well otherwise.  For the wound on the proximal aspect of the incision from the switch she is in [place] dressing changes daily and orders were written for this and I prescribed Santyl.  Discussed cleaning the wound with saline covered with Santyl followed by Silvadene dry and dry sterile dressing.  Recommend daily dressing changes. ?-Monitor for any clinical signs or symptoms of infection and directed to call the office immediately should any occur or go to the ER. ? ?X-ray ankle next appointment ? ?Return in about 2 weeks (around 06/25/2021). ? ?Trula Slade DPM ?

## 2021-06-11 NOTE — Telephone Encounter (Signed)
Sharyn Lull with [facility] called about recent medication:  ?Collagenase 167 UNIT/GM 1 application. Topical Daily ?Cost with insurance is $275. Can a different medication be called in less expensive? ? ?Please advise. ?

## 2021-06-11 NOTE — Progress Notes (Deleted)
?Cardiology Office Note:   ? ?Date:  06/11/2021  ? ?ID:  Tammy WIXTED, DOB 26-May-1950, MRN [id number] ? ?PCP:  [name], MD ?[place] Cardiologist: Glenetta Hew, MD  ? ?Reason for visit: Hospital follow-up ? ?History of Present Illness:   ? ?Tammy Good is a 71 y.o. female with a hx of CAD with history of STEMI and PCI to the RCA in 2015, NSTEMI with PCI to the left circumflex in August 2022, PAD with stenting to the right SFA in August 2022, atrial fibrillation in the setting of postop hypotension & NSTEMI in 10/2020, hypothyroidism, anemia, DM. ? ?She followed up with Jory Sims in November 2022.  She was doing well with wound healing status post SFA stenting. ? ?She was admitted in March 3 to June 03, 2021 with worsening weakness, poor appetite and chronic nausea.  Hemoglobin was 5.6.  She received blood transfusions.  Upper endoscopy showed esophageal ulcer with no active bleeding.  Tagged RBC scan was negative.  Case discussed with [name] with vascular surgery.  Plavix was discontinued and started on aspirin 81 mg daily.  With a high CHA2DS2-VASc score, she was continued on Eliquis.  Watchman candidacy discussed.  She was readmitted the next day with weakness and was discharged to SNF for rehab. ? ?Today, *** ? ?Coronary artery disease ?-STEMI and PCI to RCA in 2015; NSTEMI with PCI left circumflex in August 2022 ?-Currently on Eliquis and ASA '81mg'$  daily  ?-*** ? ?Chronic systolic and diastolic heart failure ?-Received IV Lasix during her March 2023 admission; discharged on Lasix 20 mg daily as needed ?-*** ? ?PAD ?-Status post stenting to the right SFA in August 2022, followed by vascular surgery ?-Status post left ankle incision and drainage; followed by Trula Slade DPM ?-*** ? ?Paroxysmal atrial fibrillation ?-*** ?-Refer to Dr. Lars Mage for consideration of watchman implant.  Had TEE 04/26/2021. ? ?Hypertension ?-*** ?-Goal BP is <130/80.  Recommend DASH diet  (high in vegetables, fruits, low-fat dairy products, whole grains, poultry, fish, and nuts and low in sweets, sugar-sweetened beverages, and red meats), salt restriction and increase physical activity. ? ?Hyperlipidemia ?-*** ?-Discussed cholesterol lowering diets - Mediterranean diet, DASH diet, vegetarian diet, low-carbohydrate diet and avoidance of trans fats.  Discussed healthier choice substitutes.  Nuts, high-fiber foods, and fiber supplements may also improve lipids.   ? ?Obesity ?-Discussed how even a 5-10% weight loss can have cardiovascular benefits.   ?-Recommend moderate intensity activity for 30 minutes 5 days/week and the DASH diet. ? ?Tobacco use  ?-Recommend tobacco cessation.  Reviewed physiologic effects of nicotine and the immediate-eventual benefits of quitting including improvement in cough/breathing and reduction in cardiovascular events.  Discussed quitting tips such as removing triggers and getting support from family/friends and Quitline [place]. ?-USPSTF recommends one-time screening for abdominal aortic aneurysm (AAA) by ultrasound in men 52 -43 years old who have ever smoked.     ? ? ?Disposition - Follow-up in *** ? ? ?  ?Past Medical History:  ?Diagnosis Date  ? CAD S/P percutaneous coronary angioplasty 08/2013  ? 100% mRCA - PCI Integrity Resolute DES 3.0 mm x 38 mm - 3.35 mm; PTCA of RPA V 2.0 mm x 15 mm  ? Cholesteatoma   ? right  ? Diabetes mellitus type 2 in obese [facility])   ? On insulin and Invokana  ? History of osteomyelitis L 5th Toe all 05/2019  ? s/p Partial Ray Amputation with partial closure; 6 wks Abx &  LSFA Atherectomy/DEB PTA with Stent for focal dissection.  ? Hyperlipidemia with target LDL less than 70   ? Hypothyroidism (acquired)   ? Mild essential hypertension   ? Obesity (BMI 30-39.9) 11/17/2013  ? PAD (peripheral artery disease) ([facility]) 05/26/2019  ? 05/26/19: Abd AoGram- BLE runoff -> L SFA orbital atherectomy - PTA w/ DEB & Stent 6 x 40 Luttonix (for focal dissection) - patent  Pop A with 3 V runoff. LEA Dopplers 01/03/2020: RABI (prev) 0.68 (0.53)/ RTBI (prev) 0.58 (0.33); LABI (prev) 0.80 (0.64), LTBI (prev) 0.64 (0.51); R mSFA ~50-74%, L mSFA 50-74%. Patent Prox SFA stent < 49% stenosis  ? 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 ST elevation myocardial infarction (STEMI) of inferior wall, subsequent episode of care [facility]) 08/2013  ? 80% branch of D1, 40% mid AV groove circumflex, 100% RCA with subacute thrombus -- thrombus extending into RPA V with 100% occlusion after initial angioplasty of mid RCA ;; Post MI ECHO 6/9/'15: EF 50-55%, mild LVH with moderate HK of inferior wall, Gr1 DD, mild LA dilation; mildly reduced RV function  ? ? ?Past Surgical History:  ?Procedure Laterality Date  ? ABDOMINAL AORTOGRAM W/LOWER EXTREMITY N/A 05/26/2019  ? Procedure: ABDOMINAL AORTOGRAM W/LOWER EXTREMITY;  Surgeon: Marty Heck, MD;  Location: [place] CV LAB;  Service: Cardiovascular;  Laterality: N/A;  ? ABDOMINAL AORTOGRAM W/LOWER EXTREMITY N/A 11/15/2020  ? Procedure: ABDOMINAL AORTOGRAM W/LOWER EXTREMITY;  Surgeon: Marty Heck, MD;  Location: [place] [facility];  Service: Cardiovascular;  Laterality: N/A;  ? ABDOMINAL AORTOGRAM W/LOWER EXTREMITY N/A 04/18/2021  ? Procedure: ABDOMINAL AORTOGRAM W/LOWER EXTREMITY;  Surgeon: Marty Heck, MD;  Location: [place] [facility];  Service: Cardiovascular;  Laterality: N/A;  ? AMPUTATION Left 05/25/2019  ? Procedure: AMPUTATION RAY 5th;  Surgeon: Trula Slade, DPM;  Location: [place];  Service: Podiatry;  Laterality: Left;  ? BONE BIOPSY Left 04/24/2021  ? Procedure: BONE BIOPSY;  Surgeon: Landis Martins, DPM;  Location: [place];  Service: Podiatry;  Laterality: Left;  left ankle/fibula  ? Cardiac Event Monitor  July-August 2015  ? Sinus rhythm with PVCs  ? COLONOSCOPY N/A 08/31/2013  ? Procedure: COLONOSCOPY;  Surgeon: Juanita Craver, MD;  Location: [facility] ENDOSCOPY;  Service: Endoscopy;  Laterality: N/A;  ? CORONARY STENT INTERVENTION N/A  11/19/2020  ? Procedure: CORONARY STENT INTERVENTION;  Surgeon: Nelva Bush, MD;  Location: [place] [facility];  Service: Cardiovascular;  Laterality: N/A;  ? ESOPHAGOGASTRODUODENOSCOPY N/A 09/01/2013  ? Procedure: ESOPHAGOGASTRODUODENOSCOPY (EGD);  Surgeon: Beryle Beams, MD;  Location: [facility] ENDOSCOPY;  Service: Endoscopy;  Laterality: N/A;  bedside  ? ESOPHAGOGASTRODUODENOSCOPY (EGD) WITH PROPOFOL N/A 05/26/2021  ? Procedure: ESOPHAGOGASTRODUODENOSCOPY (EGD) WITH PROPOFOL;  Surgeon: Carol Ada, MD;  Location: [place];  Service: Gastroenterology;  Laterality: N/A;  ? INCISION AND DRAINAGE Left 04/24/2021  ? Procedure: INCISION AND DRAINAGE;  Surgeon: Landis Martins, DPM;  Location: [place];  Service: Podiatry;  Laterality: Left;  ? LEFT HEART CATH AND CORONARY ANGIOGRAPHY N/A 11/19/2020  ? Procedure: LEFT HEART CATH AND CORONARY ANGIOGRAPHY;  Surgeon: Nelva Bush, MD;  Location: [place] CV [facility];  Service: Cardiovascular;  Laterality: N/A;  ? LEFT HEART CATHETERIZATION WITH CORONARY ANGIOGRAM N/A 08/30/2013  ? Procedure: LEFT HEART CATHETERIZATION WITH CORONARY ANGIOGRAM;  Surgeon: Leonie Man, MD;  Location: [facility] CATH LAB: 100% mRCA (thrombus - extends to RPAV), 80% D1, 40% AVG Cx.  ? PERCUTANEOUS CORONARY STENT INTERVENTION (PCI-S)  08/30/2013  ? Procedure: PERCUTANEOUS CORONARY STENT INTERVENTION (PCI-S);  Surgeon: Leonie Man, MD;  Location: [facility] CATH LAB;  Integrity Resolute DES 2.0 mm x 38 mm -- 3.35 mm.; PTCA of proximal RPA V. - 3.0 mm x 15 mm balloon  ? PERIPHERAL VASCULAR ATHERECTOMY  05/26/2019  ? Procedure: PERIPHERAL VASCULAR ATHERECTOMY;  Surgeon: Marty Heck, MD;  Location: [place] CV LAB;  Service: Cardiovascular;;  Left SFA  ? PERIPHERAL VASCULAR INTERVENTION  05/26/2019  ? Procedure: PERIPHERAL VASCULAR INTERVENTION;  Surgeon: Marty Heck, MD;  Location: [place] CV LAB;  Service: Cardiovascular;;  Left SFA  ? PERIPHERAL VASCULAR INTERVENTION Right  11/15/2020  ? Procedure: PERIPHERAL VASCULAR INTERVENTION;  Surgeon: Marty Heck, MD;  Location: [place] [facility] LAB;  Service: Cardiovascular;  Laterality: Right;  Superficial Femoral Artery  ? PITUITARY SU

## 2021-06-11 NOTE — Progress Notes (Signed)
error 

## 2021-06-12 ENCOUNTER — Ambulatory Visit: Payer: Medicare Other | Admitting: Physician Assistant

## 2021-06-12 DIAGNOSIS — I2510 Atherosclerotic heart disease of native coronary artery without angina pectoris: Secondary | ICD-10-CM

## 2021-06-12 DIAGNOSIS — I5042 Chronic combined systolic (congestive) and diastolic (congestive) heart failure: Secondary | ICD-10-CM

## 2021-06-12 DIAGNOSIS — I480 Paroxysmal atrial fibrillation: Secondary | ICD-10-CM

## 2021-06-12 DIAGNOSIS — I739 Peripheral vascular disease, unspecified: Secondary | ICD-10-CM

## 2021-06-24 ENCOUNTER — Ambulatory Visit: Payer: Medicare Other | Admitting: Gastroenterology

## 2021-06-24 NOTE — Progress Notes (Incomplete)
Impression:          - Esophageal ulcer with no stigmata of recent  ?                          bleeding. ?                          - Benign-appearing esophageal stenosis. ?                          - 3 cm hiatal hernia. ?                          - Normal stomach. ?                          - Normal examined duodenum. ?                          - No specimens collected. ?Recommendation:           - Return patient to hospital ward for ongoing care. ?                          - Resume regular diet. ?                          - Continue present medications. ?                          - Return to GI clinic in 2 weeks. ?                          - PPI QD indefinitely. ?                          - Signing off. ?

## 2021-06-25 ENCOUNTER — Ambulatory Visit (INDEPENDENT_AMBULATORY_CARE_PROVIDER_SITE_OTHER): Payer: Medicare Other

## 2021-06-25 ENCOUNTER — Ambulatory Visit (INDEPENDENT_AMBULATORY_CARE_PROVIDER_SITE_OTHER): Payer: Medicare Other | Admitting: Podiatry

## 2021-06-25 DIAGNOSIS — Z9889 Other specified postprocedural states: Secondary | ICD-10-CM

## 2021-06-25 DIAGNOSIS — M86172 Other acute osteomyelitis, left ankle and foot: Secondary | ICD-10-CM

## 2021-06-25 NOTE — Progress Notes (Signed)
Subjective: ?Tammy Good is a 71 y.o. is seen today in office s/p left ankle incision and drainage, bone biopsy, wound debridement preformed on 04/24/2021 with Dr. Cannon Kettle.  She states she has been doing well.  Continue with Santyl dressing changes.  She has not seen significant drainage or any increase in swelling or redness.  No significant pain.  Denies any fevers or chills. ? ?She is not on antibiotics at this time.  ? ? ? ?Objective: ?General: No acute distress, AAOx3  ?DP/PT pulses palpable, CRT < 3 sec to all digits.  ?Left ankle: Incision is well coapted without any evidence of dehiscence with sutures intact.  The wound present the proximal aspect incision measures 2.8 x 1 x 0.4 cm and there is no probing, undermining or tunneling.  No exposed bone or tendon today.  Wound base is fibrogranular there is no surrounding erythema, ascending cellulitis.  There is no fluctuation or crepitation.  There is no malodor.  ?No pain with calf compression, swelling, warmth, erythema.  ? ? ? ? ? ? ?Assessment and Plan:  ?Status post left ankle incision and drainage, bone biopsy, ? ?-Treatment options discussed including all alternatives, risks, and complications ?-X-rays obtained and reviewed of the left foot and ankle including 3 views of the foot and 2 views of the ankle.  There is no evidence of acute fracture, osteomyelitis at this time. ?-Sharply debrided the wound today utilizing #312 with scalpel down to healthy, bleeding tissue to remove some of the fibrotic tissue.  Hemostasis achieved.  Tolerated well.  Today applied a small Silvadene followed by dressing however she is in continue with Santyl dressing changes daily. ?-Monitor for any clinical signs or symptoms of infection and directed to call the office immediately should any occur or go to the ER. ? ?No follow-ups on file. ? ?Trula Slade DPM ?

## 2021-07-02 ENCOUNTER — Ambulatory Visit (INDEPENDENT_AMBULATORY_CARE_PROVIDER_SITE_OTHER): Payer: Medicare Other | Admitting: Podiatry

## 2021-07-02 DIAGNOSIS — L97322 Non-pressure chronic ulcer of left ankle with fat layer exposed: Secondary | ICD-10-CM

## 2021-07-02 DIAGNOSIS — M86172 Other acute osteomyelitis, left ankle and foot: Secondary | ICD-10-CM

## 2021-07-02 NOTE — Progress Notes (Signed)
Subjective: ?Tammy Good is a 71 y.o. is seen today in office s/p left ankle incision and drainage, bone biopsy, wound debridement preformed on 04/24/2021 with Dr. Cannon Kettle.  She is doing about the same.  She has noticed some minor drainage.  She is still wearing a surgical shoe.  She denies any fevers, chills, nausea, vomiting.  She has no new concerns today. ? ?She is not on antibiotics at this time.  ? ?Objective: ?General: No acute distress, AAOx3  ?DP/PT pulses palpable, CRT < 3 sec to all digits.  ?Left ankle: Incisions well coapted.  The wound is present the proximal aspect the incision today measures 2.5 x 1.4 x 0.2 cm and is filling in.  There is still a fibrogranular wound base.  There is no probing to bone, and or tunneling.  There is no surrounding erythema, ascending cellulitis.  There is no fluctuation or crepitation.  There is no malodor. ?No pain with calf compression, swelling, warmth, erythema.  ? ? ? ? ? ? ? ?Assessment and Plan:  ?Status post left ankle incision and drainage, bone biopsy, ? ?-Treatment options discussed including all alternatives, risks, and complications ?-Sharply debrided the wound today utilizing #3 total scalpel down to healthy, bleeding tissue in order to promote wound healing.  Pre and post wound measurements were the same.  She tolerated the procedure well.  Minimal blood loss and hemostasis was achieved with manual compression.  Silvadene was applied followed by dressing.  She is to continue with Santyl dressing changes daily.  Continue offloading. ?-Blood work ordered today including CBC, CMP, sed rate, CRP. ?-Monitor for any clinical signs or symptoms of infection and directed to call the office immediately should any occur or go to the ER. ? ?Return in about 10 days (around 07/12/2021) for ulcer. ? ?Trula Slade DPM ?

## 2021-07-04 ENCOUNTER — Other Ambulatory Visit: Payer: Self-pay | Admitting: Podiatry

## 2021-07-04 LAB — CBC WITH DIFFERENTIAL
Basophils Absolute: 0.1 10*3/uL (ref 0.0–0.2)
Basos: 1 %
EOS (ABSOLUTE): 0.1 10*3/uL (ref 0.0–0.4)
Eos: 1 %
Hematocrit: 34.3 % (ref 34.0–46.6)
Hemoglobin: 10.9 g/dL — ABNORMAL LOW (ref 11.1–15.9)
Immature Grans (Abs): 0 10*3/uL (ref 0.0–0.1)
Immature Granulocytes: 0 %
Lymphocytes Absolute: 1.1 10*3/uL (ref 0.7–3.1)
Lymphs: 9 %
MCH: 27 pg (ref 26.6–33.0)
MCHC: 31.8 g/dL (ref 31.5–35.7)
MCV: 85 fL (ref 79–97)
Monocytes Absolute: 1.1 10*3/uL — ABNORMAL HIGH (ref 0.1–0.9)
Monocytes: 9 %
Neutrophils Absolute: 10 10*3/uL — ABNORMAL HIGH (ref 1.4–7.0)
Neutrophils: 80 %
RBC: 4.03 x10E6/uL (ref 3.77–5.28)
RDW: 17.1 % — ABNORMAL HIGH (ref 11.7–15.4)
WBC: 12.4 10*3/uL — ABNORMAL HIGH (ref 3.4–10.8)

## 2021-07-04 LAB — COMPREHENSIVE METABOLIC PANEL
ALT: 11 IU/L (ref 0–32)
AST: 11 IU/L (ref 0–40)
Albumin/Globulin Ratio: 0.9 — ABNORMAL LOW (ref 1.2–2.2)
Albumin: 3.3 g/dL — ABNORMAL LOW (ref 3.8–4.8)
Alkaline Phosphatase: 123 IU/L — ABNORMAL HIGH (ref 44–121)
BUN/Creatinine Ratio: 11 — ABNORMAL LOW (ref 12–28)
BUN: 13 mg/dL (ref 8–27)
Bilirubin Total: 0.6 mg/dL (ref 0.0–1.2)
CO2: 20 mmol/L (ref 20–29)
Calcium: 9.5 mg/dL (ref 8.7–10.3)
Chloride: 100 mmol/L (ref 96–106)
Creatinine, Ser: 1.21 mg/dL — ABNORMAL HIGH (ref 0.57–1.00)
Globulin, Total: 3.6 g/dL (ref 1.5–4.5)
Glucose: 173 mg/dL — ABNORMAL HIGH (ref 70–99)
Potassium: 4.2 mmol/L (ref 3.5–5.2)
Sodium: 140 mmol/L (ref 134–144)
Total Protein: 6.9 g/dL (ref 6.0–8.5)
eGFR: 49 mL/min/{1.73_m2} — ABNORMAL LOW (ref >59)

## 2021-07-04 LAB — C-REACTIVE PROTEIN: CRP: 93 mg/L — ABNORMAL HIGH (ref 0–10)

## 2021-07-04 LAB — SEDIMENTATION RATE: Sed Rate: 27 mm/hr (ref 0–40)

## 2021-07-04 MED ORDER — LINEZOLID 600 MG PO TABS: 600.0000 mg | ORAL_TABLET | Freq: Two times a day (BID) | ORAL | 0 refills | Status: DC

## 2021-07-07 ENCOUNTER — Other Ambulatory Visit: Payer: Self-pay

## 2021-07-07 ENCOUNTER — Encounter (HOSPITAL_COMMUNITY): Payer: Self-pay

## 2021-07-07 ENCOUNTER — Emergency Department (HOSPITAL_COMMUNITY): Payer: Medicare Other

## 2021-07-07 ENCOUNTER — Inpatient Hospital Stay (HOSPITAL_COMMUNITY): Payer: Medicare Other

## 2021-07-07 ENCOUNTER — Inpatient Hospital Stay (HOSPITAL_COMMUNITY)
Admission: EM | Admit: 2021-07-07 | Discharge: 2021-07-17 | DRG: 478 | Disposition: A | Discharge status: Home Health | Payer: Medicare Other | Attending: Student in an Organized Health Care Education/Training Program | Admitting: Student in an Organized Health Care Education/Training Program

## 2021-07-07 DIAGNOSIS — E1151 Type 2 diabetes mellitus with diabetic peripheral angiopathy without gangrene: Secondary | ICD-10-CM | POA: Diagnosis present

## 2021-07-07 DIAGNOSIS — I739 Peripheral vascular disease, unspecified: Secondary | ICD-10-CM

## 2021-07-07 DIAGNOSIS — M869 Osteomyelitis, unspecified: Secondary | ICD-10-CM | POA: Diagnosis not present

## 2021-07-07 DIAGNOSIS — Z8719 Personal history of other diseases of the digestive system: Secondary | ICD-10-CM

## 2021-07-07 DIAGNOSIS — M009 Pyogenic arthritis, unspecified: Secondary | ICD-10-CM | POA: Diagnosis present

## 2021-07-07 DIAGNOSIS — Z1629 Resistance to other single specified antibiotic: Secondary | ICD-10-CM | POA: Diagnosis present

## 2021-07-07 DIAGNOSIS — A419 Sepsis, unspecified organism: Secondary | ICD-10-CM | POA: Diagnosis present

## 2021-07-07 DIAGNOSIS — I70222 Atherosclerosis of native arteries of extremities with rest pain, left leg: Secondary | ICD-10-CM | POA: Diagnosis present

## 2021-07-07 DIAGNOSIS — Z7901 Long term (current) use of anticoagulants: Secondary | ICD-10-CM

## 2021-07-07 DIAGNOSIS — E039 Hypothyroidism, unspecified: Secondary | ICD-10-CM | POA: Diagnosis present

## 2021-07-07 DIAGNOSIS — E860 Dehydration: Secondary | ICD-10-CM | POA: Diagnosis present

## 2021-07-07 DIAGNOSIS — N179 Acute kidney failure, unspecified: Secondary | ICD-10-CM | POA: Diagnosis present

## 2021-07-07 DIAGNOSIS — Z7952 Long term (current) use of systemic steroids: Secondary | ICD-10-CM

## 2021-07-07 DIAGNOSIS — E872 Acidosis, unspecified: Secondary | ICD-10-CM | POA: Diagnosis present

## 2021-07-07 DIAGNOSIS — Z8249 Family history of ischemic heart disease and other diseases of the circulatory system: Secondary | ICD-10-CM

## 2021-07-07 DIAGNOSIS — E669 Obesity, unspecified: Secondary | ICD-10-CM | POA: Diagnosis present

## 2021-07-07 DIAGNOSIS — Z6830 Body mass index (BMI) 30.0-30.9, adult: Secondary | ICD-10-CM

## 2021-07-07 DIAGNOSIS — D75839 Thrombocytosis, unspecified: Secondary | ICD-10-CM | POA: Diagnosis present

## 2021-07-07 DIAGNOSIS — I480 Paroxysmal atrial fibrillation: Secondary | ICD-10-CM | POA: Diagnosis present

## 2021-07-07 DIAGNOSIS — I10 Essential (primary) hypertension: Secondary | ICD-10-CM | POA: Diagnosis present

## 2021-07-07 DIAGNOSIS — E861 Hypovolemia: Secondary | ICD-10-CM | POA: Diagnosis present

## 2021-07-07 DIAGNOSIS — E2740 Unspecified adrenocortical insufficiency: Secondary | ICD-10-CM | POA: Diagnosis present

## 2021-07-07 DIAGNOSIS — D649 Anemia, unspecified: Secondary | ICD-10-CM | POA: Diagnosis present

## 2021-07-07 DIAGNOSIS — K5900 Constipation, unspecified: Secondary | ICD-10-CM | POA: Diagnosis not present

## 2021-07-07 DIAGNOSIS — M86672 Other chronic osteomyelitis, left ankle and foot: Secondary | ICD-10-CM | POA: Diagnosis present

## 2021-07-07 DIAGNOSIS — Z794 Long term (current) use of insulin: Secondary | ICD-10-CM

## 2021-07-07 DIAGNOSIS — E785 Hyperlipidemia, unspecified: Secondary | ICD-10-CM | POA: Diagnosis present

## 2021-07-07 DIAGNOSIS — I2510 Atherosclerotic heart disease of native coronary artery without angina pectoris: Secondary | ICD-10-CM | POA: Diagnosis present

## 2021-07-07 DIAGNOSIS — M86671 Other chronic osteomyelitis, right ankle and foot: Secondary | ICD-10-CM | POA: Diagnosis not present

## 2021-07-07 DIAGNOSIS — R531 Weakness: Principal | ICD-10-CM

## 2021-07-07 DIAGNOSIS — Z87891 Personal history of nicotine dependence: Secondary | ICD-10-CM

## 2021-07-07 DIAGNOSIS — E871 Hypo-osmolality and hyponatremia: Secondary | ICD-10-CM | POA: Diagnosis present

## 2021-07-07 DIAGNOSIS — E119 Type 2 diabetes mellitus without complications: Secondary | ICD-10-CM

## 2021-07-07 DIAGNOSIS — E11649 Type 2 diabetes mellitus with hypoglycemia without coma: Secondary | ICD-10-CM | POA: Diagnosis not present

## 2021-07-07 DIAGNOSIS — L97329 Non-pressure chronic ulcer of left ankle with unspecified severity: Secondary | ICD-10-CM | POA: Diagnosis present

## 2021-07-07 DIAGNOSIS — I252 Old myocardial infarction: Secondary | ICD-10-CM

## 2021-07-07 DIAGNOSIS — E1169 Type 2 diabetes mellitus with other specified complication: Secondary | ICD-10-CM | POA: Diagnosis present

## 2021-07-07 DIAGNOSIS — Z955 Presence of coronary angioplasty implant and graft: Secondary | ICD-10-CM

## 2021-07-07 DIAGNOSIS — R112 Nausea with vomiting, unspecified: Secondary | ICD-10-CM | POA: Diagnosis present

## 2021-07-07 DIAGNOSIS — E230 Hypopituitarism: Secondary | ICD-10-CM | POA: Diagnosis present

## 2021-07-07 DIAGNOSIS — Z79899 Other long term (current) drug therapy: Secondary | ICD-10-CM

## 2021-07-07 DIAGNOSIS — M86172 Other acute osteomyelitis, left ankle and foot: Secondary | ICD-10-CM | POA: Diagnosis present

## 2021-07-07 DIAGNOSIS — M86272 Subacute osteomyelitis, left ankle and foot: Secondary | ICD-10-CM

## 2021-07-07 DIAGNOSIS — L0390 Cellulitis, unspecified: Secondary | ICD-10-CM | POA: Diagnosis not present

## 2021-07-07 DIAGNOSIS — Z8614 Personal history of Methicillin resistant Staphylococcus aureus infection: Secondary | ICD-10-CM

## 2021-07-07 DIAGNOSIS — Z79890 Hormone replacement therapy (postmenopausal): Secondary | ICD-10-CM

## 2021-07-07 LAB — COMPREHENSIVE METABOLIC PANEL
ALT: 11 U/L (ref 0–44)
AST: 17 U/L (ref 15–41)
Albumin: 2.4 g/dL — ABNORMAL LOW (ref 3.5–5.0)
Alkaline Phosphatase: 97 U/L (ref 38–126)
Anion gap: 16 — ABNORMAL HIGH (ref 5–15)
BUN: 12 mg/dL (ref 8–23)
CO2: 20 mmol/L — ABNORMAL LOW (ref 22–32)
Calcium: 8.6 mg/dL — ABNORMAL LOW (ref 8.9–10.3)
Chloride: 96 mmol/L — ABNORMAL LOW (ref 98–111)
Creatinine, Ser: 1.33 mg/dL — ABNORMAL HIGH (ref 0.44–1.00)
GFR, Estimated: 43 mL/min — ABNORMAL LOW (ref >60)
Glucose, Bld: 198 mg/dL — ABNORMAL HIGH (ref 70–99)
Potassium: 3.5 mmol/L (ref 3.5–5.1)
Sodium: 132 mmol/L — ABNORMAL LOW (ref 135–145)
Total Bilirubin: 0.9 mg/dL (ref 0.3–1.2)
Total Protein: 7.1 g/dL (ref 6.5–8.1)

## 2021-07-07 LAB — CBC WITH DIFFERENTIAL/PLATELET
Abs Immature Granulocytes: 0.08 10*3/uL — ABNORMAL HIGH (ref 0.00–0.07)
Basophils Absolute: 0.1 10*3/uL (ref 0.0–0.1)
Basophils Relative: 1 %
Eosinophils Absolute: 0.2 10*3/uL (ref 0.0–0.5)
Eosinophils Relative: 2 %
HCT: 33.6 % — ABNORMAL LOW (ref 36.0–46.0)
Hemoglobin: 10.6 g/dL — ABNORMAL LOW (ref 12.0–15.0)
Immature Granulocytes: 1 %
Lymphocytes Relative: 12 %
Lymphs Abs: 1.4 10*3/uL (ref 0.7–4.0)
MCH: 27.1 pg (ref 26.0–34.0)
MCHC: 31.5 g/dL (ref 30.0–36.0)
MCV: 85.9 fL (ref 80.0–100.0)
Monocytes Absolute: 0.8 10*3/uL (ref 0.1–1.0)
Monocytes Relative: 7 %
Neutro Abs: 8.9 10*3/uL — ABNORMAL HIGH (ref 1.7–7.7)
Neutrophils Relative %: 77 %
Platelets: 610 10*3/uL — ABNORMAL HIGH (ref 150–400)
RBC: 3.91 MIL/uL (ref 3.87–5.11)
RDW: 19.1 % — ABNORMAL HIGH (ref 11.5–15.5)
WBC: 11.5 10*3/uL — ABNORMAL HIGH (ref 4.0–10.5)
nRBC: 0 % (ref 0.0–0.2)

## 2021-07-07 LAB — LACTIC ACID, PLASMA
Lactic Acid, Venous: 3.1 mmol/L (ref 0.5–1.9)
Lactic Acid, Venous: 1.5 mmol/L (ref 0.5–1.9)

## 2021-07-07 LAB — POC OCCULT BLOOD, ED: Fecal Occult Bld: NEGATIVE

## 2021-07-07 LAB — APTT: aPTT: 50 seconds — ABNORMAL HIGH (ref 24–36)

## 2021-07-07 LAB — PROTIME-INR
INR: 2.6 — ABNORMAL HIGH (ref 0.8–1.2)
Prothrombin Time: 27.8 seconds — ABNORMAL HIGH (ref 11.4–15.2)

## 2021-07-07 LAB — CBG MONITORING, ED: Glucose-Capillary: 154 mg/dL — ABNORMAL HIGH (ref 70–99)

## 2021-07-07 LAB — SEDIMENTATION RATE: Sed Rate: 105 mm/hr — ABNORMAL HIGH (ref 0–22)

## 2021-07-07 LAB — C-REACTIVE PROTEIN: CRP: 35.3 mg/dL — ABNORMAL HIGH (ref <1.0)

## 2021-07-07 LAB — GLUCOSE, CAPILLARY: Glucose-Capillary: 196 mg/dL — ABNORMAL HIGH (ref 70–99)

## 2021-07-07 LAB — BRAIN NATRIURETIC PEPTIDE: B Natriuretic Peptide: 74 pg/mL (ref 0.0–100.0)

## 2021-07-07 MED ORDER — VANCOMYCIN HCL IN DEXTROSE 1-5 GM/200ML-% IV SOLN: 1000.0000 mg | Freq: Once | INTRAVENOUS | Status: DC

## 2021-07-07 MED ORDER — ATORVASTATIN CALCIUM 40 MG PO TABS
40.0000 mg | ORAL_TABLET | Freq: Every day | ORAL | Status: DC
  Administered 2021-07-07 – 2021-07-17 (×11): 40 mg via ORAL
  Filled 2021-07-07 (×11): qty 1

## 2021-07-07 MED ORDER — ACETAMINOPHEN 325 MG PO TABS
650.0000 mg | ORAL_TABLET | Freq: Four times a day (QID) | ORAL | Status: DC | PRN
  Administered 2021-07-07 – 2021-07-10 (×2): 650 mg via ORAL
  Filled 2021-07-07 (×2): qty 2

## 2021-07-07 MED ORDER — LACTATED RINGERS IV SOLN: INTRAVENOUS | Status: DC

## 2021-07-07 MED ORDER — HYDROCORTISONE SOD SUC (PF) 100 MG IJ SOLR
75.0000 mg | Freq: Two times a day (BID) | INTRAMUSCULAR | Status: DC
  Administered 2021-07-07 – 2021-07-08 (×2): 75 mg via INTRAVENOUS
  Filled 2021-07-07 (×2): qty 2

## 2021-07-07 MED ORDER — VANCOMYCIN HCL 1250 MG/250ML IV SOLN
1250.0000 mg | INTRAVENOUS | Status: DC
Start: 2021-07-08 — End: 2021-07-11
  Administered 2021-07-08 – 2021-07-10 (×3): 1250 mg via INTRAVENOUS
  Filled 2021-07-07 (×4): qty 250

## 2021-07-07 MED ORDER — HYDROCORTISONE SOD SUC (PF) 100 MG IJ SOLR
100.0000 mg | Freq: Once | INTRAMUSCULAR | Status: AC
  Administered 2021-07-07: 100 mg via INTRAVENOUS
  Filled 2021-07-07: qty 2

## 2021-07-07 MED ORDER — ENOXAPARIN SODIUM 100 MG/ML IJ SOSY
1.0000 mg/kg | PREFILLED_SYRINGE | Freq: Two times a day (BID) | INTRAMUSCULAR | Status: DC
  Administered 2021-07-07 – 2021-07-09 (×4): 85 mg via SUBCUTANEOUS
  Filled 2021-07-07 (×3): qty 1
  Filled 2021-07-07: qty 0.85
  Filled 2021-07-07: qty 1
  Filled 2021-07-07: qty 0.85

## 2021-07-07 MED ORDER — METRONIDAZOLE 500 MG/100ML IV SOLN
500.0000 mg | Freq: Once | INTRAVENOUS | Status: AC
  Administered 2021-07-07: 500 mg via INTRAVENOUS
  Filled 2021-07-07: qty 100

## 2021-07-07 MED ORDER — ONDANSETRON HCL 4 MG/2ML IJ SOLN
4.0000 mg | Freq: Once | INTRAMUSCULAR | Status: AC
  Administered 2021-07-07: 4 mg via INTRAVENOUS
  Filled 2021-07-07: qty 2

## 2021-07-07 MED ORDER — LACTATED RINGERS IV BOLUS (SEPSIS)
1000.0000 mL | Freq: Once | INTRAVENOUS | Status: AC
  Administered 2021-07-07: 1000 mL via INTRAVENOUS

## 2021-07-07 MED ORDER — SODIUM CHLORIDE 0.9 % IV SOLN
2.0000 g | Freq: Two times a day (BID) | INTRAVENOUS | Status: DC
  Administered 2021-07-07 – 2021-07-09 (×5): 2 g via INTRAVENOUS
  Filled 2021-07-07 (×5): qty 12.5

## 2021-07-07 MED ORDER — GADOBUTROL 1 MMOL/ML IV SOLN
8.6000 mL | Freq: Once | INTRAVENOUS | Status: AC | PRN
  Administered 2021-07-07: 8.6 mL via INTRAVENOUS

## 2021-07-07 MED ORDER — COLLAGENASE 250 UNIT/GM EX OINT
1.0000 "application " | TOPICAL_OINTMENT | Freq: Every day | CUTANEOUS | Status: DC
  Filled 2021-07-07: qty 30

## 2021-07-07 MED ORDER — VANCOMYCIN HCL 2000 MG/400ML IV SOLN
2000.0000 mg | Freq: Once | INTRAVENOUS | Status: AC
  Administered 2021-07-07: 2000 mg via INTRAVENOUS
  Filled 2021-07-07: qty 400

## 2021-07-07 MED ORDER — SODIUM CHLORIDE 0.9 % IV SOLN
2.0000 g | Freq: Once | INTRAVENOUS | Status: AC
  Administered 2021-07-07: 2 g via INTRAVENOUS
  Filled 2021-07-07: qty 12.5

## 2021-07-07 MED ORDER — INSULIN ASPART 100 UNIT/ML IJ SOLN
0.0000 [IU] | Freq: Three times a day (TID) | INTRAMUSCULAR | Status: DC
  Administered 2021-07-07: 2 [IU] via SUBCUTANEOUS
  Administered 2021-07-08 (×2): 5 [IU] via SUBCUTANEOUS
  Administered 2021-07-08: 2 [IU] via SUBCUTANEOUS

## 2021-07-07 MED ORDER — INSULIN ASPART 100 UNIT/ML IJ SOLN: 0.0000 [IU] | INTRAMUSCULAR | Status: DC

## 2021-07-07 MED ORDER — LEVOTHYROXINE SODIUM 25 MCG PO TABS
125.0000 ug | ORAL_TABLET | Freq: Every day | ORAL | Status: DC
  Administered 2021-07-08 – 2021-07-17 (×9): 125 ug via ORAL
  Filled 2021-07-07 (×11): qty 1

## 2021-07-07 MED ORDER — INSULIN GLARGINE-YFGN 100 UNIT/ML ~~LOC~~ SOLN
20.0000 [IU] | Freq: Every day | SUBCUTANEOUS | Status: DC
  Administered 2021-07-07: 20 [IU] via SUBCUTANEOUS
  Filled 2021-07-07 (×3): qty 0.2

## 2021-07-07 MED ORDER — ACETAMINOPHEN 650 MG RE SUPP: 650.0000 mg | Freq: Four times a day (QID) | RECTAL | Status: DC | PRN

## 2021-07-07 MED ORDER — MIDODRINE HCL 5 MG PO TABS
5.0000 mg | ORAL_TABLET | Freq: Two times a day (BID) | ORAL | Status: DC
Start: 2021-07-07 — End: 2021-07-15
  Administered 2021-07-07 – 2021-07-14 (×15): 5 mg via ORAL
  Filled 2021-07-07 (×16): qty 1

## 2021-07-07 NOTE — ED Notes (Signed)
Patient transported to MRI 

## 2021-07-07 NOTE — Progress Notes (Signed)
Elink following code sepsis °

## 2021-07-07 NOTE — H&P (Signed)
? ?NAME:  Tammy Good, MRN:  [id number], DOB:  18-Jan-1951, LOS: 0 ?ADMISSION DATE:  07/07/2021, Primary: Tammy Bowen, MD  ?CHIEF COMPLAINT:  weakness  ? ?Medical Service: Internal Medicine Teaching Service    ?     ?Attending Physician: Dr. Aldine Contes, MD    ?First Contact: Dr. Howie Ill Pager: [phone]  ?Second Contact: Dr. Johnney Ou Pager: [phone]  ?     ?After Hours (After 5p/  First Contact Pager: [phone]  ?weekends / holidays): Second Contact Pager: [phone]  ? ? ?History of present illness   ?Tammy Good is a 71 year old female with pertinent diagnoses of osteomyelitis of the left foot, diabetes, panhypopituitarism.  She is presenting with 2 days of nausea, vomiting, and generalized weakness x2 days.  Emesis 4-5 times per day and is nonbloody.  She has had difficulty keeping fluids down.  Denies fevers, chills, abdominal pain, dysuria, cough, or shortness of breath. Denies drainage from the left ankle wound. Endorses increased swelling over the lateral aspect of the ankle over the past 2 days.  ? ?Recent History: ?She was admitted in January for sepsis secondary to a diabetic wound infection on her left foot with tenosynovitis/osteomyelitis and was found to have MRSA bacteremia.  She underwent I&D with debridement of the peroneal tendon and biopsy of fibula.  She underwent TEE which was negative for endocarditis.  Follow-up blood cultures from 2/1 were negative.  PICC line was placed and vancomycin was continued through 2/16.   ? ?She followed up with infectious disease on 2/16, at which time she was transition to linezolid since wound culture from the OR was resistant to tetracyclines.  She was instructed to continue the linezolid for another 2 weeks. ? ?She was readmitted to the hospital 3/3 - 06/03/2021 for blood loss anemia in the setting of Eliquis and Plavix.  She underwent upper endoscopy on 3/5 which revealed esophageal ulcers without any active signs of bleeding.  Hemoglobin  continues to drift down.  Tagged RBC scan was negative.  Eventually hemoglobin stabilized and Eliquis was resumed.  Plavix was discontinued at discharge. ? ?She returned to the ED on 3/14 with hypokalemia, for which she was discharged with a prescription for oral potassium. ? ?She has been following with Dr. Earleen Newport in podiatry for ongoing monitoring of her lower extremity wound.  Last office visit was 4/11, at which time wound appeared to be continuing to heal. ? ?Past Medical History  ?She,  has a past medical history of CAD S/P percutaneous coronary angioplasty (08/2013), Cholesteatoma, Diabetes mellitus type 2 in obese [facility]), History of osteomyelitis L 5th Toe all (05/2019), Hyperlipidemia with target LDL less than 70, Hypothyroidism (acquired), Mild essential hypertension, Obesity (BMI 30-39.9) (11/17/2013), PAD (peripheral artery disease) ([place]) (05/26/2019), Panhypopituitarism ([place]), and ST elevation myocardial infarction (STEMI) of inferior wall, subsequent episode of care [facility]) (08/2013).  ? ?Home Medications    ? ?Prior to Admission medications   ?Medication Sig Start Date End Date Taking? Authorizing Provider  ?acetaminophen (TYLENOL) 500 MG tablet Take 500-1,000 mg by mouth 3 (three) times daily as needed for headache (pain).   Yes [provider]  ?apixaban (ELIQUIS) 5 MG TABS tablet Take 1 tablet (5 mg total) by mouth 2 (two) times daily. 11/21/20  Yes Baglia, Corrina, PA-C  ?atorvastatin (LIPITOR) 40 MG tablet Take 1 tablet (40 mg total) by mouth daily at 6 PM. ?Patient taking differently: Take 40 mg by mouth every evening. 09/03/13  Yes Cherene Altes, MD  ?collagenase Annitta Needs)  250 UNIT/GM ointment Apply 1 application. topically daily. ?Patient taking differently: Apply 1 application. topically daily. Left foot 06/11/21  Yes Trula Slade, DPM  ?dapagliflozin propanediol (FARXIGA) 10 MG TABS tablet Take 10 mg by mouth every morning.   Yes [provider]  ?dexamethasone (DECADRON)  0.5 MG tablet Take 0.5 mg by mouth every morning. 09/03/13  Yes Cherene Altes, MD  ?ferrous sulfate 325 (65 FE) MG tablet Take 325 mg by mouth every evening.   Yes [provider]  ?furosemide (LASIX) 20 MG tablet Take 1 tablet (20 mg total) by mouth daily as needed for fluid or edema. 06/03/21 06/03/22 Yes Rai, Ripudeep K, MD  ?insulin aspart (NOVOLOG FLEXPEN) 100 UNIT/ML FlexPen Inject 3-8 Units into the skin See admin instructions. Inject 3-8 units subcutaneously up to three times daily per sliding scale - based on carb count   Yes [provider]  ?Insulin Glargine (BASAGLAR KWIKPEN) 100 UNIT/ML SOPN Inject 42 Units into the skin at bedtime. 07/11/15  Yes [provider]  ?levothyroxine (SYNTHROID) 125 MCG tablet Take 1 tablet (125 mcg total) by mouth daily before breakfast. 11/21/20  Yes Baglia, Corrina, PA-C  ?linezolid (ZYVOX) 600 MG tablet Take 1 tablet (600 mg total) by mouth 2 (two) times daily. ?Patient taking differently: Take 600 mg by mouth See admin instructions. Bid x 7 days 07/04/21  Yes Trula Slade, DPM  ?metoCLOPramide (REGLAN) 5 MG tablet Take 1 tablet (5 mg total) by mouth 3 (three) times daily as needed for nausea or vomiting. Take before meals as needed, if having nausea 06/03/21 06/03/22 Yes Rai, Ripudeep K, MD  ?midodrine (PROAMATINE) 5 MG tablet Take 1 tablet (5 mg total) by mouth 2 (two) times daily with a meal. 06/03/21  Yes Rai, Ripudeep K, MD  ?nitroGLYCERIN (NITROSTAT) 0.4 MG SL tablet Place 1 tablet (0.4 mg total) under the tongue every 5 (five) minutes as needed for chest pain (up to 3 doses. If taking 3rd dose call 911). 11/21/20  Yes Dunn, Dayna N, PA-C  ?nystatin cream (MYCOSTATIN) Apply 1 application topically 2 (two) times daily. ?Patient taking differently: Apply 1 application. topically 2 (two) times daily as needed (rash). 08/29/19  Yes Carlyle Basques, MD  ?Omega-3 Fatty Acids (FISH OIL) 1000 MG CAPS Take 1,000 mg by mouth every morning.   Yes  [provider]  ?pantoprazole (PROTONIX) 40 MG tablet Take 1 tablet (40 mg total) by mouth 2 (two) times daily before a meal. 09/03/13  Yes Cherene Altes, MD  ?polyethylene glycol powder (GLYCOLAX/MIRALAX) 17 GM/SCOOP powder Take 1 capful with water (17 g) by mouth daily. 06/04/21  Yes Rai, Ripudeep K, MD  ?promethazine (PHENERGAN) 12.5 MG tablet Take 1 tablet (12.5 mg total) by mouth every 6 (six) hours as needed for nausea, vomiting or refractory nausea / vomiting. 06/03/21  Yes Rai, Ripudeep K, MD  ?Propylene Glycol (SYSTANE COMPLETE) 0.6 % SOLN Place 1 drop into both eyes daily as needed (dry eyes).   Yes [provider]  ?B-D UF III MINI PEN NEEDLES 31G X 5 MM MISC Inject into the skin. 05/28/20   [provider]  ?Continuous Blood Gluc Sensor (FREESTYLE LIBRE 2 SENSOR) MISC Apply topically every 14 (fourteen) days. 06/12/20   [provider]  ? ? ?Allergies   ? ?Allergies as of 07/07/2021 - Review Complete 07/07/2021  ?Allergen Reaction Noted  ? Strawberry extract Itching, Swelling, and Anaphylaxis 10/06/2013  ? ? ?Social History  ? reports that she  has quit smoking. She has never used smokeless tobacco. She reports that she does not drink alcohol and does not use drugs.  ? ?Family History   ?Her family history includes Alzheimer's disease in her maternal grandmother; Cancer in her sister; Cancer (age of onset: 29) in her mother; Heart attack (age of onset: 21) in her father.  ? ?Objective   ?Blood pressure (!) 126/48, pulse (!) 110, temperature (!) 100.9 ?F (38.3 ?C), temperature source Rectal, resp. rate (!) 22, weight 86.2 kg, SpO2 93 %. ?   ?General: Chronically ill-appearing obese female lying on the stretcher in no distress ?HEENT: Moist mucous membranes ?Cardiac: Tachycardic rate, regular rhythm.  Aside from swelling over the left lateral ankle, no lower extremity edema ?Pulm: Breathing comfortably on room air, lungs are clear ?GI: Abdomen soft, nontender ?Neuro:  Alert and oriented x4 ?Skin: L-shaped scar over the lateral aspect of the left lower leg and over onto the lateral aspect of the foot.  No drainage from the lateral ankle wound on the left.  Significant swelling wit

## 2021-07-07 NOTE — Progress Notes (Signed)
ANTICOAGULATION CONSULT NOTE - Initial Consult ? ?Pharmacy Consult for Lovenox ?Indication: atrial fibrillation ? ?Allergies  ?Allergen Reactions  ? Strawberry Extract Itching, Swelling and Anaphylaxis  ?  Mouth swells and gets itchy  ? ? ?Patient Measurements: ?Weight: 86.2 kg (190 lb) ? ? ?Vital Signs: ?Temp: 100.9 ?F (38.3 ?C) (04/16 6546) ?Temp Source: Rectal (04/16 5035) ?BP: 126/48 (04/16 0817) ?Pulse Rate: 110 (04/16 0817) ? ?Labs: ?Recent Labs  ?  07/07/21 ?0325  ?HGB 10.6*  ?HCT 33.6*  ?PLT 610*  ?APTT 50*  ?LABPROT 27.8*  ?INR 2.6*  ?CREATININE 1.33*  ? ? ?Estimated Creatinine Clearance: 45 mL/min (A) (by C-G formula based on SCr of 1.33 mg/dL (H)). ? ? ?Medical History: ?Past Medical History:  ?Diagnosis Date  ? CAD S/P percutaneous coronary angioplasty 08/2013  ? 100% mRCA - PCI Integrity Resolute DES 3.0 mm x 38 mm - 3.35 mm; PTCA of RPA V 2.0 mm x 15 mm  ? Cholesteatoma   ? right  ? Diabetes mellitus type 2 in obese [facility])   ? On insulin and Invokana  ? History of osteomyelitis L 5th Toe all 05/2019  ? s/p Partial Ray Amputation with partial closure; 6 wks Abx & LSFA Atherectomy/DEB PTA with Stent for focal dissection.  ? Hyperlipidemia with target LDL less than 70   ? Hypothyroidism (acquired)   ? Mild essential hypertension   ? Obesity (BMI 30-39.9) 11/17/2013  ? PAD (peripheral artery disease) ([place]) 05/26/2019  ? 05/26/19: Abd AoGram- BLE runoff -> L SFA orbital atherectomy - PTA w/ DEB & Stent 6 x 40 Luttonix (for focal dissection) - patent Pop A with 3 V runoff. LEA Dopplers 01/03/2020: RABI (prev) 0.68 (0.53)/ RTBI (prev) 0.58 (0.33); LABI (prev) 0.80 (0.64), LTBI (prev) 0.64 (0.51); R mSFA ~50-74%, L mSFA 50-74%. Patent Prox SFA stent < 49% stenosis  ? Panhypopituitarism ([place])   ? ST elevation myocardial infarction (STEMI) of inferior wall, subsequent episode of care [facility]) 08/2013  ? 80% branch of D1, 40% mid AV groove circumflex, 100% RCA with subacute thrombus -- thrombus extending into RPA V with  100% occlusion after initial angioplasty of mid RCA ;; Post MI ECHO 6/9/'15: EF 50-55%, mild LVH with moderate HK of inferior wall, Gr1 DD, mild LA dilation; mildly reduced RV function  ? ? ?Medications:  ?(Not in a hospital admission)  ? ?Assessment: ?3 YOF N/V and weakness on apixaban PTA for h/o Afib to be transitioned to Lovenox while admitted in anticipation of OR.  ? ?H/H low, Plt elevated. SCr elevated. CrCl ~ 45 mL/min  ? ?Goal of Therapy:  ?Anti-Xa level 0.6-1 units/ml 4hrs after LMWH dose given ?Monitor platelets by anticoagulation protocol: Yes ?  ?Plan:  ?-Lovenox 85 mg (1 mg/kg) twice daily  ?-Monitor CBC, renal fx and watch for s/s of bleeding  ?-F/u transition back to oral anticoagulation  ? ?Albertina Parr, PharmD., BCCCP ?Clinical Pharmacist ?Please refer to AMION for unit-specific pharmacist  ? ? ? ?

## 2021-07-07 NOTE — ED Notes (Signed)
Pt requesting synthroid that she states she wasn't given this am. Per pharmacy, okay to give in the evening. ?

## 2021-07-07 NOTE — ED Notes (Signed)
Pt in MRI.

## 2021-07-07 NOTE — ED Notes (Signed)
Lactic and urine sample tube down to the main lab at 5:30 am and when follow up with the main lab, lab staff states it may be lost on the tube station. Dr. Karle Starch notified. ?

## 2021-07-07 NOTE — ED Notes (Signed)
Pt checked their cbg monitor and it read 118 ?

## 2021-07-07 NOTE — ED Triage Notes (Signed)
Pt BIB [facility] from c/o generalized weakness x 2 weeks and nausea.  ?

## 2021-07-07 NOTE — ED Notes (Signed)
Date and time results received: 07/07/21  ? ?Test: lactic ?Critical Value: 3.1 ? ?Name of Provider Notified: Karle Starch ? ? ?

## 2021-07-07 NOTE — ED Notes (Signed)
Remus Blake (Daughter) called asking for an update on Kristee. Her number is [phone]. ?

## 2021-07-07 NOTE — Progress Notes (Signed)
Pharmacy Antibiotic Note ? ?[name] is a 71 y.o. female admitted on 07/07/2021 with sepsis 2/2 cellulitis. 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 Pharmacy has been consulted for Vancomycin/cefepime dosing. ? ?Scr 1.33 (fluctuates around 1.1-1.4) ?Blood and urine cultures pending ?3L of LR given for volume repletion ? ?Plan: ?Vancomycin '2000mg'$  LD x1 followed by vancomycin '1250mg'$  q24hr ?Plan to obtain levels at steady state if therapy continued ?Cefepime 2gm q12hr ?Will monitor for acute changes in renal function and adjust as needed ?F/u cultures results and de-escalate as appropriate ? ?Weight: 101 kg (222 lb 10.6 oz) ? ?Temp (24hrs), Avg:99.7 ?F (37.6 ?C), Min:98.5 ?F (36.9 ?C), Max:100.9 ?F (38.3 ?C) ? ?Recent Labs  ?Lab 07/03/21 ?1250 07/07/21 ?0325  ?WBC 12.4* 11.5*  ?CREATININE 1.21* 1.33*  ?LATICACIDVEN  --  3.1*  ?  ?Estimated Creatinine Clearance: 48.8 mL/min (A) (by C-G formula based on SCr of 1.33 mg/dL (H)).   ? ?Allergies  ?Allergen Reactions  ? Strawberry Extract Itching, Swelling and Anaphylaxis  ?  Mouth swells and gets itchy  ? ? ?Thank you for allowing pharmacy to be a part of this patient?s care. ? ?Donnald Garre, PharmD ?Clinical Pharmacist ? ?Please check AMION for all [place] numbers ?After 10:00 PM, call [place] [phone] ? ?

## 2021-07-07 NOTE — Progress Notes (Signed)
MRI results with worsening osteomyelitis and new osteomyelitis of the cuboid, talar head, navicular.  Progressive osteomyelitis of the calcaneus and new calcaneocuboid and talonavicular septic arthritis.  Findings discussed with podiatry, Dr. Posey Pronto saw the patient earlier today.  He recommends consult with orthopedics.  She has been seen in the past by [name].  Discussed with Dr. Tempie Donning from Ortho care states patient will be seen by [name] tomorrow morning.  Appreciate their assistance.  We will continue with IV antibiotics at this time. ?

## 2021-07-07 NOTE — ED Provider Notes (Signed)
? ?[place]  ?Provider Note ? ?CSN: [id number] ?Arrival date & time: 07/07/21 4920 ? ?History ?Chief Complaint  ?Patient presents with  ? Weakness  ? ? ?Tammy Good is a 71 y.o. female with history of HTN, BM, PAD, CAD, GI bleeding presents via EMS from home. She was recently admitted for AKI, symptomatic anemia and GI bleeding, was discharged home but returned the following day requesting SNF admission. She was there for about 3 weeks but has been back home for the last 10 days. She reports onset of nausea and vomiting tonight, saw some blood before arrival, also having general weakness progressing over the last 2 weeks. She denies any fevers, no diarrhea, no dysuria. She reports some baseline SOB but no change from baseline. Denies chest pain or abdominal pain. She has a known ulceration on L leg, managed by Podiatry.  ? ? ?Home Medications ?Prior to Admission medications   ?Medication Sig Start Date End Date Taking? Authorizing Provider  ?acetaminophen (TYLENOL) 500 MG tablet Take 500-1,000 mg by mouth 3 (three) times daily as needed for headache (pain).   Yes [provider]  ?apixaban (ELIQUIS) 5 MG TABS tablet Take 1 tablet (5 mg total) by mouth 2 (two) times daily. 11/21/20  Yes Baglia, Corrina, PA-C  ?atorvastatin (LIPITOR) 40 MG tablet Take 1 tablet (40 mg total) by mouth daily at 6 PM. ?Patient taking differently: Take 40 mg by mouth every evening. 09/03/13  Yes Cherene Altes, MD  ?collagenase (SANTYL) 250 UNIT/GM ointment Apply 1 application. topically daily. ?Patient taking differently: Apply 1 application. topically daily. Left foot 06/11/21  Yes Trula Slade, DPM  ?dapagliflozin propanediol (FARXIGA) 10 MG TABS tablet Take 10 mg by mouth every morning.   Yes [provider]  ?dexamethasone (DECADRON) 0.5 MG tablet Take 0.5 mg by mouth every morning. 09/03/13  Yes Cherene Altes, MD  ?ferrous sulfate 325 (65 FE) MG tablet Take  325 mg by mouth every evening.   Yes [provider]  ?furosemide (LASIX) 20 MG tablet Take 1 tablet (20 mg total) by mouth daily as needed for fluid or edema. 06/03/21 06/03/22 Yes Rai, Ripudeep K, MD  ?insulin aspart (NOVOLOG FLEXPEN) 100 UNIT/ML FlexPen Inject 3-8 Units into the skin See admin instructions. Inject 3-8 units subcutaneously up to three times daily per sliding scale - based on carb count   Yes [provider]  ?Insulin Glargine (BASAGLAR KWIKPEN) 100 UNIT/ML SOPN Inject 42 Units into the skin at bedtime. 07/11/15  Yes [provider]  ?levothyroxine (SYNTHROID) 125 MCG tablet Take 1 tablet (125 mcg total) by mouth daily before breakfast. 11/21/20  Yes Baglia, Corrina, PA-C  ?linezolid (ZYVOX) 600 MG tablet Take 1 tablet (600 mg total) by mouth 2 (two) times daily. ?Patient taking differently: Take 600 mg by mouth See admin instructions. Bid x 7 days 07/04/21  Yes Trula Slade, DPM  ?metoCLOPramide (REGLAN) 5 MG tablet Take 1 tablet (5 mg total) by mouth 3 (three) times daily as needed for nausea or vomiting. Take before meals as needed, if having nausea 06/03/21 06/03/22 Yes Rai, Ripudeep K, MD  ?midodrine (PROAMATINE) 5 MG tablet Take 1 tablet (5 mg total) by mouth 2 (two) times daily with a meal. 06/03/21  Yes Rai, Ripudeep K, MD  ?nitroGLYCERIN (NITROSTAT) 0.4 MG SL tablet Place 1 tablet (0.4 mg total) under the tongue every 5 (five) minutes as needed for chest pain (up to 3 doses. If taking  3rd dose call 911). 11/21/20  Yes Dunn, Dayna N, PA-C  ?nystatin cream (MYCOSTATIN) Apply 1 application topically 2 (two) times daily. ?Patient taking differently: Apply 1 application. topically 2 (two) times daily as needed (rash). 08/29/19  Yes Carlyle Basques, MD  ?Omega-3 Fatty Acids (FISH OIL) 1000 MG CAPS Take 1,000 mg by mouth every morning.   Yes [provider]  ?pantoprazole (PROTONIX) 40 MG tablet Take 1 tablet (40 mg total) by mouth 2 (two) times daily before a meal.  09/03/13  Yes Cherene Altes, MD  ?polyethylene glycol powder (GLYCOLAX/MIRALAX) 17 GM/SCOOP powder Take 1 capful with water (17 g) by mouth daily. 06/04/21  Yes Rai, Ripudeep K, MD  ?promethazine (PHENERGAN) 12.5 MG tablet Take 1 tablet (12.5 mg total) by mouth every 6 (six) hours as needed for nausea, vomiting or refractory nausea / vomiting. 06/03/21  Yes Rai, Ripudeep K, MD  ?Propylene Glycol (SYSTANE COMPLETE) 0.6 % SOLN Place 1 drop into both eyes daily as needed (dry eyes).   Yes [provider]  ?B-D UF III MINI PEN NEEDLES 31G X 5 MM MISC Inject into the skin. 05/28/20   [provider]  ?Continuous Blood Gluc Sensor (FREESTYLE LIBRE 2 SENSOR) MISC Apply topically every 14 (fourteen) days. 06/12/20   [provider]  ? ? ? ?Allergies    ?Strawberry extract ? ? ?Review of Systems   ?Review of Systems ?Please see HPI for pertinent positives and negatives ? ?Physical Exam ?BP (!) 105/46 (BP Location: Right Arm)   Pulse 88   Temp 98.4 ?F (36.9 ?C) (Oral)   Resp 18   Ht '5\' 7"'$  (1.702 m)   Wt 86.9 kg   SpO2 99%   BMI 30.01 kg/m?  ? ?Physical Exam ?Vitals and nursing note reviewed.  ?Constitutional:   ?   Appearance: Normal appearance.  ?HENT:  ?   Head: Normocephalic and atraumatic.  ?   Nose: Nose normal.  ?   Mouth/Throat:  ?   Mouth: Mucous membranes are moist.  ?Eyes:  ?   Extraocular Movements: Extraocular movements intact.  ?   Conjunctiva/sclera: Conjunctivae normal.  ?Cardiovascular:  ?   Rate and Rhythm: Tachycardia present.  ?Pulmonary:  ?   Effort: Pulmonary effort is normal.  ?   Breath sounds: Normal breath sounds. No wheezing or rales.  ?Abdominal:  ?   General: Abdomen is flat.  ?   Palpations: Abdomen is soft.  ?   Tenderness: There is no abdominal tenderness. There is no guarding.  ?Musculoskeletal:     ?   General: No swelling. Normal range of motion.  ?   Cervical back: Neck supple.  ?   Right lower leg: Edema present.  ?   Left lower leg: Edema present.  ?Skin: ?    General: Skin is warm and dry.  ?   Comments: Superficial wound on L leg, unchanged from recent Podiatry office visit  ?Neurological:  ?   General: No focal deficit present.  ?   Mental Status: She is alert.  ?Psychiatric:     ?   Mood and Affect: Mood normal.  ? ? ? ?ED Results / Procedures / Treatments   ?EKG ?EKG Interpretation ? ?Date/Time:  Sunday July 07 2021 02:38:40 EDT ?Ventricular Rate:  124 ?PR Interval:  156 ?QRS Duration: 98 ?QT Interval:  280 ?QTC Calculation: 402 ?R Axis:   255 ?Text Interpretation: Sinus tachycardia with Fusion complexes Inferior-posterior infarct , age undetermined Anterolateral infarct , age  undetermined Abnormal ECG Since last tracing Rate faster Confirmed by Calvert Cantor [phone]) on 07/07/2021 3:32:40 AM ? ?Procedures ?Procedures ? ?Medications Ordered in the ED ?Medications  ?ceFEPIme (MAXIPIME) 2 g in sodium chloride 0.9 % 100 mL IVPB (has no administration in time range)  ?vancomycin (VANCOREADY) IVPB 1250 mg/250 mL (has no administration in time range)  ?atorvastatin (LIPITOR) tablet 40 mg (40 mg Oral Given 07/07/21 1723)  ?midodrine (PROAMATINE) tablet 5 mg (5 mg Oral Given 07/07/21 1723)  ?levothyroxine (SYNTHROID) tablet 125 mcg (has no administration in time range)  ?acetaminophen (TYLENOL) tablet 650 mg (has no administration in time range)  ?  Or  ?acetaminophen (TYLENOL) suppository 650 mg (has no administration in time range)  ?hydrocortisone sodium succinate (SOLU-CORTEF) 100 MG injection 100 mg (100 mg Intravenous Given 07/07/21 1411)  ?  Followed by  ?hydrocortisone sodium succinate (SOLU-CORTEF) 100 MG injection 75 mg (has no administration in time range)  ?enoxaparin (LOVENOX) injection 85 mg (85 mg Subcutaneous Not Given 07/07/21 1509)  ?insulin glargine-yfgn (SEMGLEE) injection 20 Units (has no administration in time range)  ?insulin aspart (novoLOG) injection 0-9 Units (2 Units Subcutaneous Given 07/07/21 1759)  ?collagenase (SANTYL) ointment 1 application.  (has no administration in time range)  ?lactated ringers bolus 1,000 mL (0 mLs Intravenous Stopped 07/07/21 0532)  ?ondansetron [facility]) injection 4 mg (4 mg Intravenous Given 07/07/21 0331)  ?lactated ringers b

## 2021-07-07 NOTE — ED Provider Notes (Signed)
Care of patient assumed Dr. Karle Starch at 7 AM.  This patient presenting for generalized weakness and nausea.  Hypotensive and tachycardic on arrival but afebrile.  Labs show leukocytosis and lactic acidosis.  She has gotten IV fluids.  No clear source of infection at this time.  Urinalysis is pending. ?Physical Exam  ?BP (!) 124/50   Pulse (!) 110   Temp 98.5 ?F (36.9 ?C)   Resp (!) 22   SpO2 97%  ? ?Physical Exam ?Vitals and nursing note reviewed.  ?Constitutional:   ?   General: She is not in acute distress. ?   Appearance: Normal appearance. She is well-developed. She is not ill-appearing, toxic-appearing or diaphoretic.  ?HENT:  ?   Head: Normocephalic and atraumatic.  ?   Right Ear: External ear normal.  ?   Left Ear: External ear normal.  ?   Nose: Nose normal.  ?   Mouth/Throat:  ?   Mouth: Mucous membranes are moist.  ?Eyes:  ?   Extraocular Movements: Extraocular movements intact.  ?   Conjunctiva/sclera: Conjunctivae normal.  ?Cardiovascular:  ?   Rate and Rhythm: Regular rhythm. Tachycardia present.  ?   Heart sounds: No murmur heard. ?Pulmonary:  ?   Effort: Pulmonary effort is normal. No respiratory distress.  ?Abdominal:  ?   General: There is no distension.  ?   Palpations: Abdomen is soft.  ?   Tenderness: There is no abdominal tenderness.  ?Musculoskeletal:     ?   General: Swelling present.  ?   Cervical back: Normal range of motion and neck supple. No rigidity.  ?   Comments: Hot, swollen left foot and ankle  ?Skin: ?   General: Skin is warm and dry.  ?   Capillary Refill: Capillary refill takes less than 2 seconds.  ?Neurological:  ?   General: No focal deficit present.  ?   Mental Status: She is alert and oriented to person, place, and time.  ?   Cranial Nerves: No cranial nerve deficit.  ?   Sensory: No sensory deficit.  ?   Motor: No weakness.  ?   Coordination: Coordination normal.  ?Psychiatric:     ?   Mood and Affect: Mood normal.     ?   Behavior: Behavior normal.     ?   Thought  Content: Thought content normal.     ?   Judgment: Judgment normal.  ? ? ?Procedures  ?Procedures ? ?ED Course / MDM  ? ?Clinical Course as of 07/07/21 0711  ?Sun Jul 07, 2021  ?0406 CBC with mild leukocytosis improved from recent labs, stable Hgb. Lactic acid is mildly elevated, will recheck after IVF. Coags elevated as expected from [facility] use. BP is improved.  [CS]  ?70 I personally viewed the images from radiology studies and agree with radiologist interpretation: CXR without infiltrate or edema.  ? [CS]  ?[phone] CMP with CKD at baseline, K is not low as it was previously. Mild metabolic acidosis.  [CS]  ?0441 BNP is normal.  [CS]  ?0618 Patient reports she is feeling much better and will try to drink some fluids. UA and repeat lactic acid are pending. I do not feel that her presentation is consistent with sepsis and more likely from fluid losses. She would like to go home if possible.  [CS]  ?0645 Per RN, lab never received her urine or lactic acid samples. Will need recollection. Will give another liter of LR while waiting for continued  mild tachycardia.  [CS]  ?0704 Care of the patient signed out to Dr. Doren Custard at the change of shift pending Urine and ultimate disposition.  [CS]  ?  ?Clinical Course User Index ?[CS] Truddie Hidden, MD  ? ?Medical Decision Making ?Amount and/or Complexity of Data Reviewed ?Labs: ordered. ?Radiology: ordered. ?ECG/medicine tests: ordered. ? ?Risk ?Prescription drug management. ?Decision regarding hospitalization. ? ? ?On assessment, patient resting comfortably.  She rates tachycardic with a widened pulse pressure.  She reports improved symptoms following IV fluids.  Rectal temperature found to be 100.9 degrees.  Patient does have warmth and swelling to her lateral left foot and ankle.  She reports that she was started on an antibiotic for this yesterday due to recent lab work that was concerning for infection.  I suspect that this is likely source of infection.  Broad-spectrum  antibiotics ordered.  I spoke with podiatrist on-call, Dr. Posey Pronto, who recommended MRI of ankle as well as ABI studies.  He will follow the patient while admitted and provide further recommendations.  Patient was admitted to medicine. ? ? ? ? ?  ?Godfrey Pick, MD ?07/08/21 5176 ? ?

## 2021-07-07 NOTE — Progress Notes (Signed)
?  Subjective:  ?Patient ID: Tammy Good, female    DOB: 04-27-1950,  MRN: [id number] ? ?[name] with past medical history of left foot diabetes and osteomyelitis.  She presents to the ED with nausea vomiting chills generalized weakness for 2 days.  She states that the ankle has been feeling fine no pain.  She was known to [name] who has been treating the wound.  Podiatry was consulted to evaluate the wound and for wound care recommendations. ?Objective:  ? ?Vitals:  ? 07/07/21 0728 07/07/21 0817  ?BP:  (!) 126/48  ?Pulse:  (!) 110  ?Resp:  (!) 22  ?Temp: (!) 100.9 ?F (38.3 ?C)   ?SpO2:  93%  ? ?General AA&O x3. Normal mood and affect.  ?Vascular Dorsalis pedis and posterior tibial pulses non-palpable bilat. ?Brisk capillary refill diminished to all digits pedal hair not present.  ?Neurologic Epicritic sensation grossly intact.  ?Dermatologic  The wound is present the proximal aspect the incision today measures 2.5 x 1.4 x 0.2 cm and is filling in.  There is still a fibrogranular wound base.  There is no probing to bone, and or tunneling.  There is no surrounding erythema, ascending cellulitis.  There is no fluctuation or crepitation.  There is no malodor. ?No pain with calf compression, swelling, warmth, erythema.   ?Orthopedic: MMT 5/5 in dorsiflexion, plantarflexion, inversion, and eversion. ?Normal joint ROM without pain or crepitus.  ?Radiograph left ankle:Generalized soft tissue swelling with no acute osseous abnormality ?Identified ? ? ? ? ?Assessment & Plan:  ?Patient was evaluated and treated and all questions answered. ? ?Left lateral ankle diabetic wound without clinical signs of infection ?-All questions and concerns were addressed and discussed with the patient in extensive detail. ?-Patient's recent ABIs were reviewed which show patient does not have good flow to the left lower extremity.  She will benefit from vascular work-up. ?-Please consult vascular surgery for further work-up.   She has extensive history with vascular surgery. ?-From our standpoint no surgical plans at this time.  Clinically the wound is not infected nor probing down to deep tissue/bone ?-However we will continue to follow until the MRI of the left ankle is completed. ?-Weightbearing as tolerated to the left foot ?- ?Felipa Furnace, DPM ? ?Accessible via secure chat for questions or concerns. ? ?

## 2021-07-07 NOTE — Progress Notes (Signed)
VASCULAR LAB ? ? ? ?ABIs have been performed. ? ?See CV proc for preliminary results. ? ? ?Allyssia Skluzacek, RVT ?07/07/2021, 9:08 AM ? ?

## 2021-07-08 DIAGNOSIS — I739 Peripheral vascular disease, unspecified: Secondary | ICD-10-CM

## 2021-07-08 DIAGNOSIS — M86172 Other acute osteomyelitis, left ankle and foot: Secondary | ICD-10-CM

## 2021-07-08 DIAGNOSIS — M86672 Other chronic osteomyelitis, left ankle and foot: Secondary | ICD-10-CM | POA: Diagnosis not present

## 2021-07-08 LAB — BASIC METABOLIC PANEL
Anion gap: 9 (ref 5–15)
BUN: 11 mg/dL (ref 8–23)
CO2: 21 mmol/L — ABNORMAL LOW (ref 22–32)
Calcium: 8.2 mg/dL — ABNORMAL LOW (ref 8.9–10.3)
Chloride: 107 mmol/L (ref 98–111)
Creatinine, Ser: 0.99 mg/dL (ref 0.44–1.00)
GFR, Estimated: >60 mL/min (ref >60)
Glucose, Bld: 306 mg/dL — ABNORMAL HIGH (ref 70–99)
Potassium: 3.5 mmol/L (ref 3.5–5.1)
Sodium: 137 mmol/L (ref 135–145)

## 2021-07-08 LAB — GLUCOSE, CAPILLARY
Glucose-Capillary: 272 mg/dL — ABNORMAL HIGH (ref 70–99)
Glucose-Capillary: 161 mg/dL — ABNORMAL HIGH (ref 70–99)
Glucose-Capillary: 265 mg/dL — ABNORMAL HIGH (ref 70–99)
Glucose-Capillary: 328 mg/dL — ABNORMAL HIGH (ref 70–99)

## 2021-07-08 LAB — PROTIME-INR
INR: 1.7 — ABNORMAL HIGH (ref 0.8–1.2)
Prothrombin Time: 19.5 seconds — ABNORMAL HIGH (ref 11.4–15.2)

## 2021-07-08 LAB — CBC
HCT: 26.9 % — ABNORMAL LOW (ref 36.0–46.0)
Hemoglobin: 8.6 g/dL — ABNORMAL LOW (ref 12.0–15.0)
MCH: 27.5 pg (ref 26.0–34.0)
MCHC: 32 g/dL (ref 30.0–36.0)
MCV: 85.9 fL (ref 80.0–100.0)
Platelets: 480 10*3/uL — ABNORMAL HIGH (ref 150–400)
RBC: 3.13 MIL/uL — ABNORMAL LOW (ref 3.87–5.11)
RDW: 18.6 % — ABNORMAL HIGH (ref 11.5–15.5)
WBC: 7.3 10*3/uL (ref 4.0–10.5)
nRBC: 0 % (ref 0.0–0.2)

## 2021-07-08 MED ORDER — COLLAGENASE 250 UNIT/GM EX OINT
1.0000 "application " | TOPICAL_OINTMENT | Freq: Every day | CUTANEOUS | Status: DC
  Administered 2021-07-08 – 2021-07-15 (×7): 1 via TOPICAL
  Filled 2021-07-08 (×2): qty 30

## 2021-07-08 MED ORDER — PANTOPRAZOLE SODIUM 40 MG PO TBEC
40.0000 mg | DELAYED_RELEASE_TABLET | Freq: Two times a day (BID) | ORAL | Status: DC
  Administered 2021-07-08 – 2021-07-17 (×20): 40 mg via ORAL
  Filled 2021-07-08 (×21): qty 1

## 2021-07-08 MED ORDER — HYDROCORTISONE 10 MG PO TABS
10.0000 mg | ORAL_TABLET | Freq: Every day | ORAL | Status: DC
  Administered 2021-07-08: 10 mg via ORAL
  Filled 2021-07-08 (×2): qty 1

## 2021-07-08 MED ORDER — HYDROCORTISONE 20 MG PO TABS
20.0000 mg | ORAL_TABLET | Freq: Every day | ORAL | Status: DC
  Administered 2021-07-09: 20 mg via ORAL
  Filled 2021-07-08: qty 1

## 2021-07-08 MED ORDER — INSULIN GLARGINE-YFGN 100 UNIT/ML ~~LOC~~ SOLN
30.0000 [IU] | Freq: Every day | SUBCUTANEOUS | Status: DC
Start: 2021-07-08 — End: 2021-07-09
  Administered 2021-07-08: 30 [IU] via SUBCUTANEOUS
  Filled 2021-07-08: qty 0.3

## 2021-07-08 NOTE — Consult Note (Signed)
? ? ?ORTHOPAEDIC CONSULTATION ? ?REQUESTING PHYSICIAN: Axel Filler, * ? ?Chief Complaint: Left foot pain. ? ?HPI: ?Tammy Good is a 71 y.o. female who presents with peripheral vascular disease status post fifth ray amputation with podiatry in 2021.  Most recently she has undergone an incision and drainage of the peroneal tendons in February of this year.  With current MRI findings there is a question of osteomyelitis involving the hindfoot and midfoot. ? ?Past Medical History:  ?Diagnosis Date  ? CAD S/P percutaneous coronary angioplasty 08/2013  ? 100% mRCA - PCI Integrity Resolute DES 3.0 mm x 38 mm - 3.35 mm; PTCA of RPA V 2.0 mm x 15 mm  ? Cholesteatoma   ? right  ? Diabetes mellitus type 2 in obese [facility])   ? On insulin and Invokana  ? History of osteomyelitis L 5th Toe all 05/2019  ? s/p Partial Ray Amputation with partial closure; 6 wks Abx & LSFA Atherectomy/DEB PTA with Stent for focal dissection.  ? Hyperlipidemia with target LDL less than 70   ? Hypothyroidism (acquired)   ? Mild essential hypertension   ? Obesity (BMI 30-39.9) 11/17/2013  ? PAD (peripheral artery disease) ([place]) 05/26/2019  ? 05/26/19: Abd AoGram- BLE runoff -> L SFA orbital atherectomy - PTA w/ DEB & Stent 6 x 40 Luttonix (for focal dissection) - patent Pop A with 3 V runoff. LEA Dopplers 01/03/2020: RABI (prev) 0.68 (0.53)/ RTBI (prev) 0.58 (0.33); LABI (prev) 0.80 (0.64), LTBI (prev) 0.64 (0.51); R mSFA ~50-74%, L mSFA 50-74%. Patent Prox SFA stent < 49% stenosis  ? Panhypopituitarism ([place])   ? ST elevation myocardial infarction (STEMI) of inferior wall, subsequent episode of care [facility]) 08/2013  ? 80% branch of D1, 40% mid AV groove circumflex, 100% RCA with subacute thrombus -- thrombus extending into RPA V with 100% occlusion after initial angioplasty of mid RCA ;; Post MI ECHO 6/9/'15: EF 50-55%, mild LVH with moderate HK of inferior wall, Gr1 DD, mild LA dilation; mildly reduced RV function  ? ?Past Surgical History:   ?Procedure Laterality Date  ? ABDOMINAL AORTOGRAM W/LOWER EXTREMITY N/A 05/26/2019  ? Procedure: ABDOMINAL AORTOGRAM W/LOWER EXTREMITY;  Surgeon: Marty Heck, MD;  Location: [place] CV LAB;  Service: Cardiovascular;  Laterality: N/A;  ? ABDOMINAL AORTOGRAM W/LOWER EXTREMITY N/A 11/15/2020  ? Procedure: ABDOMINAL AORTOGRAM W/LOWER EXTREMITY;  Surgeon: Marty Heck, MD;  Location: [place] CV LAB;  Service: Cardiovascular;  Laterality: N/A;  ? ABDOMINAL AORTOGRAM W/LOWER EXTREMITY N/A 04/18/2021  ? Procedure: ABDOMINAL AORTOGRAM W/LOWER EXTREMITY;  Surgeon: Marty Heck, MD;  Location: [place] CV LAB;  Service: Cardiovascular;  Laterality: N/A;  ? AMPUTATION Left 05/25/2019  ? Procedure: AMPUTATION RAY 5th;  Surgeon: Trula Slade, DPM;  Location: [place];  Service: Podiatry;  Laterality: Left;  ? BONE BIOPSY Left 04/24/2021  ? Procedure: BONE BIOPSY;  Surgeon: Landis Martins, DPM;  Location: [place];  Service: Podiatry;  Laterality: Left;  left ankle/fibula  ? Cardiac Event Monitor  July-August 2015  ? Sinus rhythm with PVCs  ? COLONOSCOPY N/A 08/31/2013  ? Procedure: COLONOSCOPY;  Surgeon: Juanita Craver, MD;  Location: [facility] ENDOSCOPY;  Service: Endoscopy;  Laterality: N/A;  ? CORONARY STENT INTERVENTION N/A 11/19/2020  ? Procedure: CORONARY STENT INTERVENTION;  Surgeon: Nelva Bush, MD;  Location: [place] CV LAB;  Service: Cardiovascular;  Laterality: N/A;  ? ESOPHAGOGASTRODUODENOSCOPY N/A 09/01/2013  ? Procedure: ESOPHAGOGASTRODUODENOSCOPY (EGD);  Surgeon: Beryle Beams, MD;  Location: [facility]  ENDOSCOPY;  Service: Endoscopy;  Laterality: N/A;  bedside  ? ESOPHAGOGASTRODUODENOSCOPY (EGD) WITH PROPOFOL N/A 05/26/2021  ? Procedure: ESOPHAGOGASTRODUODENOSCOPY (EGD) WITH PROPOFOL;  Surgeon: Carol Ada, MD;  Location: [place];  Service: Gastroenterology;  Laterality: N/A;  ? INCISION AND DRAINAGE Left 04/24/2021  ? Procedure: INCISION AND DRAINAGE;  Surgeon: Landis Martins, DPM;   Location: [place];  Service: Podiatry;  Laterality: Left;  ? LEFT HEART CATH AND CORONARY ANGIOGRAPHY N/A 11/19/2020  ? Procedure: LEFT HEART CATH AND CORONARY ANGIOGRAPHY;  Surgeon: Nelva Bush, MD;  Location: [place] CV LAB;  Service: Cardiovascular;  Laterality: N/A;  ? LEFT HEART CATHETERIZATION WITH CORONARY ANGIOGRAM N/A 08/30/2013  ? Procedure: LEFT HEART CATHETERIZATION WITH CORONARY ANGIOGRAM;  Surgeon: Leonie Man, MD;  Location: [facility] CATH LAB: 100% mRCA (thrombus - extends to RPAV), 80% D1, 40% AVG Cx.  ? PERCUTANEOUS CORONARY STENT INTERVENTION (PCI-S)  08/30/2013  ? Procedure: PERCUTANEOUS CORONARY STENT INTERVENTION (PCI-S);  Surgeon: Leonie Man, MD;  Location: [facility] CATH LAB;  Integrity Resolute DES 2.0 mm x 38 mm -- 3.35 mm.; PTCA of proximal RPA V. - 3.0 mm x 15 mm balloon  ? PERIPHERAL VASCULAR ATHERECTOMY  05/26/2019  ? Procedure: PERIPHERAL VASCULAR ATHERECTOMY;  Surgeon: Marty Heck, MD;  Location: [place] CV LAB;  Service: Cardiovascular;;  Left SFA  ? PERIPHERAL VASCULAR INTERVENTION  05/26/2019  ? Procedure: PERIPHERAL VASCULAR INTERVENTION;  Surgeon: Marty Heck, MD;  Location: [place] CV LAB;  Service: Cardiovascular;;  Left SFA  ? PERIPHERAL VASCULAR INTERVENTION Right 11/15/2020  ? Procedure: PERIPHERAL VASCULAR INTERVENTION;  Surgeon: Marty Heck, MD;  Location: [place] CV LAB;  Service: Cardiovascular;  Laterality: Right;  Superficial Femoral Artery  ? PITUITARY SURGERY    ? TEE WITHOUT CARDIOVERSION N/A 04/26/2021  ? Procedure: TRANSESOPHAGEAL ECHOCARDIOGRAM (TEE);  Surgeon: Buford Dresser, MD;  Location: [facility] ENDOSCOPY;  Service: Cardiovascular;  Laterality: N/A;  ? TRANSTHORACIC ECHOCARDIOGRAM  08/30/2013  ? mild LVH. EF 50-55%. Moderate HK of the entire inferior myocardium. GR 1 DD. Mild LA dilation. Mildly reduced RV function  ? TYMPANOMASTOIDECTOMY Right 12/28/2017  ? Procedure: RIGHT TYMPANOMASTOIDECTOMY;  Surgeon: Leta Baptist, MD;   Location: [place];  Service: ENT;  Laterality: Right;  ? ?Social History  ? ?Socioeconomic History  ? Marital status: Widowed  ?  Spouse name: Not on file  ? Number of children: Not on file  ? Years of education: Not on file  ? Highest education level: Not on file  ?Occupational History  ? Not on file  ?Tobacco Use  ? Smoking status: Former  ? Smokeless tobacco: Never  ?Substance and Sexual Activity  ? Alcohol use: No  ? Drug use: No  ? Sexual activity: Not Currently  ?  Birth control/protection: Post-menopausal  ?Other Topics Concern  ? Not on file  ?Social History Narrative  ? Widow. Works at [employer].  ? Former smoker.  ? Overall not very active.  Hoping to get into water aerobics class.  ? ?Social Determinants of Health  ? ?Financial Resource Strain: Not on file  ?Food Insecurity: Not on file  ?Transportation Needs: Not on file  ?Physical Activity: Not on file  ?Stress: Not on file  ?Social Connections: Not on file  ? ?Family History  ?Problem Relation Age of Onset  ? Cancer Mother 32  ?     multiple myeloma  ? Heart attack Father 37  ? Cancer Sister   ? Alzheimer's  disease Maternal Grandmother   ? ?- negative except otherwise stated in the family history section ?Allergies  ?Allergen Reactions  ? Strawberry Extract Itching, Swelling and Anaphylaxis  ?  Mouth swells and gets itchy  ? ?Prior to Admission medications   ?Medication Sig Start Date End Date Taking? Authorizing Provider  ?acetaminophen (TYLENOL) 500 MG tablet Take 500-1,000 mg by mouth 3 (three) times daily as needed for headache (pain).   Yes [provider]  ?apixaban (ELIQUIS) 5 MG TABS tablet Take 1 tablet (5 mg total) by mouth 2 (two) times daily. 11/21/20  Yes Baglia, Corrina, PA-C  ?atorvastatin (LIPITOR) 40 MG tablet Take 1 tablet (40 mg total) by mouth daily at 6 PM. ?Patient taking differently: Take 40 mg by mouth every evening. 09/03/13  Yes Cherene Altes, MD  ?collagenase (SANTYL) 250 UNIT/GM ointment  Apply 1 application. topically daily. ?Patient taking differently: Apply 1 application. topically daily. Left foot 06/11/21  Yes Trula Slade, DPM  ?dapagliflozin propanediol (FARXIGA) 10 MG TABS t

## 2021-07-08 NOTE — Consult Note (Addendum)
?Hospital Consult ? ? ? ?Reason for Consult:  Osteomyelitis of left foot, PAD ?Requesting Physician:  Dr. Posey Pronto ?MRN #:  [id number] ? ?History of Present Illness: This is a 71 y.o. female who presented to ED with nausea, vomiting, chills with weakness and loss of appetite for 3 days. MRI is showing worsening osteomyelitis of her left cuboid, talar head, and navicular with septic arthritis. She has been seen by podiatry who is not planning any surgical intervention at this time. She is presently on IV Antibiotics. We have been consulted for evaluation of her PAD. She is very well known to [facility] having undergone several lower extremity arterial interventions in the past. She has bilateral SFA stents. She has had a left lateral ankle wound since January,when she had I&D of her Peroneal Tendon. In follow up with [place] in early January there was concern about recurrent SFA stenosis so she underwent Angiography by [name] that showed no flow limiting stenosis, therefore no intervention was indicated. She currently denies any pain in her leg or foot. She is ambulatory with a cane.  She is on Eliquis for atrial fibrillation. She is on a daily statin.  Past medical history also significant for insulin-dependent diabetes mellitus.  She denies tobacco use. ? ?Past Medical History:  ?Diagnosis Date  ? CAD S/P percutaneous coronary angioplasty 08/2013  ? 100% mRCA - PCI Integrity Resolute DES 3.0 mm x 38 mm - 3.35 mm; PTCA of RPA V 2.0 mm x 15 mm  ? Cholesteatoma   ? right  ? Diabetes mellitus type 2 in obese [facility])   ? On insulin and Invokana  ? History of osteomyelitis L 5th Toe all 05/2019  ? s/p Partial Ray Amputation with partial closure; 6 wks Abx & LSFA Atherectomy/DEB PTA with Stent for focal dissection.  ? Hyperlipidemia with target LDL less than 70   ? Hypothyroidism (acquired)   ? Mild essential hypertension   ? Obesity (BMI 30-39.9) 11/17/2013  ? PAD (peripheral artery disease) ([place]) 05/26/2019  ? 05/26/19: Abd AoGram- BLE  runoff -> L SFA orbital atherectomy - PTA w/ DEB & Stent 6 x 40 Luttonix (for focal dissection) - patent Pop A with 3 V runoff. LEA Dopplers 01/03/2020: RABI (prev) 0.68 (0.53)/ RTBI (prev) 0.58 (0.33); LABI (prev) 0.80 (0.64), LTBI (prev) 0.64 (0.51); R mSFA ~50-74%, L mSFA 50-74%. Patent Prox SFA stent < 49% stenosis  ? Panhypopituitarism ([place])   ? ST elevation myocardial infarction (STEMI) of inferior wall, subsequent episode of care [facility]) 08/2013  ? 80% branch of D1, 40% mid AV groove circumflex, 100% RCA with subacute thrombus -- thrombus extending into RPA V with 100% occlusion after initial angioplasty of mid RCA ;; Post MI ECHO 6/9/'15: EF 50-55%, mild LVH with moderate HK of inferior wall, Gr1 DD, mild LA dilation; mildly reduced RV function  ? ? ?Past Surgical History:  ?Procedure Laterality Date  ? ABDOMINAL AORTOGRAM W/LOWER EXTREMITY N/A 05/26/2019  ? Procedure: ABDOMINAL AORTOGRAM W/LOWER EXTREMITY;  Surgeon: Marty Heck, MD;  Location: [place] CV LAB;  Service: Cardiovascular;  Laterality: N/A;  ? ABDOMINAL AORTOGRAM W/LOWER EXTREMITY N/A 11/15/2020  ? Procedure: ABDOMINAL AORTOGRAM W/LOWER EXTREMITY;  Surgeon: Marty Heck, MD;  Location: [place] CV LAB;  Service: Cardiovascular;  Laterality: N/A;  ? ABDOMINAL AORTOGRAM W/LOWER EXTREMITY N/A 04/18/2021  ? Procedure: ABDOMINAL AORTOGRAM W/LOWER EXTREMITY;  Surgeon: Marty Heck, MD;  Location: [place] CV LAB;  Service: Cardiovascular;  Laterality: N/A;  ? AMPUTATION Left  05/25/2019  ? Procedure: AMPUTATION RAY 5th;  Surgeon: Trula Slade, DPM;  Location: [place];  Service: Podiatry;  Laterality: Left;  ? BONE BIOPSY Left 04/24/2021  ? Procedure: BONE BIOPSY;  Surgeon: Landis Martins, DPM;  Location: [place];  Service: Podiatry;  Laterality: Left;  left ankle/fibula  ? Cardiac Event Monitor  July-August 2015  ? Sinus rhythm with PVCs  ? COLONOSCOPY N/A 08/31/2013  ? Procedure: COLONOSCOPY;  Surgeon: Juanita Craver, MD;   Location: [facility] ENDOSCOPY;  Service: Endoscopy;  Laterality: N/A;  ? CORONARY STENT INTERVENTION N/A 11/19/2020  ? Procedure: CORONARY STENT INTERVENTION;  Surgeon: Nelva Bush, MD;  Location: [place] CV LAB;  Service: Cardiovascular;  Laterality: N/A;  ? ESOPHAGOGASTRODUODENOSCOPY N/A 09/01/2013  ? Procedure: ESOPHAGOGASTRODUODENOSCOPY (EGD);  Surgeon: Beryle Beams, MD;  Location: [facility] ENDOSCOPY;  Service: Endoscopy;  Laterality: N/A;  bedside  ? ESOPHAGOGASTRODUODENOSCOPY (EGD) WITH PROPOFOL N/A 05/26/2021  ? Procedure: ESOPHAGOGASTRODUODENOSCOPY (EGD) WITH PROPOFOL;  Surgeon: Carol Ada, MD;  Location: [place];  Service: Gastroenterology;  Laterality: N/A;  ? INCISION AND DRAINAGE Left 04/24/2021  ? Procedure: INCISION AND DRAINAGE;  Surgeon: Landis Martins, DPM;  Location: [place];  Service: Podiatry;  Laterality: Left;  ? LEFT HEART CATH AND CORONARY ANGIOGRAPHY N/A 11/19/2020  ? Procedure: LEFT HEART CATH AND CORONARY ANGIOGRAPHY;  Surgeon: Nelva Bush, MD;  Location: [place] CV LAB;  Service: Cardiovascular;  Laterality: N/A;  ? LEFT HEART CATHETERIZATION WITH CORONARY ANGIOGRAM N/A 08/30/2013  ? Procedure: LEFT HEART CATHETERIZATION WITH CORONARY ANGIOGRAM;  Surgeon: Leonie Man, MD;  Location: [facility] CATH LAB: 100% mRCA (thrombus - extends to RPAV), 80% D1, 40% AVG Cx.  ? PERCUTANEOUS CORONARY STENT INTERVENTION (PCI-S)  08/30/2013  ? Procedure: PERCUTANEOUS CORONARY STENT INTERVENTION (PCI-S);  Surgeon: Leonie Man, MD;  Location: [facility] CATH LAB;  Integrity Resolute DES 2.0 mm x 38 mm -- 3.35 mm.; PTCA of proximal RPA V. - 3.0 mm x 15 mm balloon  ? PERIPHERAL VASCULAR ATHERECTOMY  05/26/2019  ? Procedure: PERIPHERAL VASCULAR ATHERECTOMY;  Surgeon: Marty Heck, MD;  Location: [place] CV LAB;  Service: Cardiovascular;;  Left SFA  ? PERIPHERAL VASCULAR INTERVENTION  05/26/2019  ? Procedure: PERIPHERAL VASCULAR INTERVENTION;  Surgeon: Marty Heck, MD;  Location: [place] CV LAB;  Service: Cardiovascular;;  Left SFA  ? PERIPHERAL VASCULAR INTERVENTION Right 11/15/2020  ? Procedure: PERIPHERAL VASCULAR INTERVENTION;  Surgeon: Marty Heck, MD;  Location: [place] CV LAB;  Service: Cardiovascular;  Laterality: Right;  Superficial Femoral Artery  ? PITUITARY SURGERY    ? TEE WITHOUT CARDIOVERSION N/A 04/26/2021  ? Procedure: TRANSESOPHAGEAL ECHOCARDIOGRAM (TEE);  Surgeon: Buford Dresser, MD;  Location: [facility] ENDOSCOPY;  Service: Cardiovascular;  Laterality: N/A;  ? TRANSTHORACIC ECHOCARDIOGRAM  08/30/2013  ? mild LVH. EF 50-55%. Moderate HK of the entire inferior myocardium. GR 1 DD. Mild LA dilation. Mildly reduced RV function  ? TYMPANOMASTOIDECTOMY Right 12/28/2017  ? Procedure: RIGHT TYMPANOMASTOIDECTOMY;  Surgeon: Leta Baptist, MD;  Location: [place];  Service: ENT;  Laterality: Right;  ? ? ?Allergies  ?Allergen Reactions  ? Strawberry Extract Itching, Swelling and Anaphylaxis  ?  Mouth swells and gets itchy  ? ? ?Prior to Admission medications   ?Medication Sig Start Date End Date Taking? Authorizing Provider  ?acetaminophen (TYLENOL) 500 MG tablet Take 500-1,000 mg by mouth 3 (three) times daily as needed for headache (pain).   Yes [provider]  ?apixaban (ELIQUIS) 5 MG TABS tablet Take  1 tablet (5 mg total) by mouth 2 (two) times daily. 11/21/20  Yes Baglia, Corrina, PA-C  ?atorvastatin (LIPITOR) 40 MG tablet Take 1 tablet (40 mg total) by mouth daily at 6 PM. ?Patient taking differently: Take 40 mg by mouth every evening. 09/03/13  Yes Cherene Altes, MD  ?collagenase (SANTYL) 250 UNIT/GM ointment Apply 1 application. topically daily. ?Patient taking differently: Apply 1 application. topically daily. Left foot 06/11/21  Yes Trula Slade, DPM  ?dapagliflozin propanediol (FARXIGA) 10 MG TABS tablet Take 10 mg by mouth every morning.   Yes [provider]  ?dexamethasone (DECADRON) 0.5 MG tablet Take 0.5 mg by mouth  every morning. 09/03/13  Yes Cherene Altes, MD  ?ferrous sulfate 325 (65 FE) MG tablet Take 325 mg by mouth every evening.   Yes [provider]  ?furosemide (LASIX) 20 MG tablet Take 1 tablet (20

## 2021-07-08 NOTE — Progress Notes (Signed)
?  Transition of Care (TOC) Screening Note ? ? ?Patient Details  ?Name: Tammy Good ?Date of Birth: Mar 16, 1951 ? ? ?Transition of Care (TOC) CM/SW Contact:    ?Tom-Johnson, Renea Ee, RN ?Phone Number: ?07/08/2021, 2:18 PM ? ?Patient is from home alone. Admitted for Sepsis 2/2 Lt ankle Osteomyelitis. Has two daughters whom are supportive. Independent with care and able to drive self prior to admission. Currently Retired. Has a cane, walker, shower chair, raised toilet seat at home.  ?PCP is [name], MD and uses [facility] and also [employer] order.  ?Daughter or sister to transport at discharge.  ?Transition of Care Department [facility]) has reviewed patient and no TOC needs or recommendations have been identified at this time. TOC will continue to monitor patient advancement through interdisciplinary progression rounds. If new patient transition needs arise, please place a TOC consult. ?  ?

## 2021-07-08 NOTE — Progress Notes (Addendum)
? ?HD#1 ?SUBJECTIVE:  ?Patient Summary: Tammy Good is a 71 y.o. 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  ? ?Overnight Events: MRI showed worsening of osteomyelitis and new osteomyelitis of cuboid, talar head, and navicular.  ? ?Interim History: Patient assessed at bedside this AM. She currently lives alone and is concerned that if she needs an amputation she would lose her independence.  She states that she feels much better and no longer had nausea or vomiting. Discussed with her that vascular surgery and orthopedic surgery would be coming by to give recommendations. No other concerns at this. ? ?OBJECTIVE:  ?Vital Signs: ?Vitals:  ? 07/07/21 1800 07/07/21 2107 07/08/21 0206 07/08/21 0520  ?BP: (!) 113/52 (!) 105/46 (!) 111/46 (!) 117/54  ?Pulse: 88 88 89 87  ?Resp: '20 18 17 20  '$ ?Temp:  98.4 ?F (36.9 ?C) 98.5 ?F (36.9 ?C)   ?TempSrc:  Oral  Oral  ?SpO2: 98% 99% 93% 100%  ?Weight:  86.9 kg    ?Height:  '5\' 7"'$  (1.702 m)    ? ?Supplemental O2: Room Air ?SpO2: 100 % ?O2 Flow Rate (L/min): 2 L/min ? ?Filed Weights  ? 07/07/21 0630 07/07/21 0804 07/07/21 2107  ?Weight: 101 kg 86.2 kg 86.9 kg  ? ? ? ?Intake/Output Summary (Last 24 hours) at 07/08/2021 1610 ?Last data filed at 07/08/2021 0530 ?Gross per 24 hour  ?Intake 340 ml  ?Output 500 ml  ?Net -160 ml  ? ?Net IO Since Admission: 840 mL [07/08/21 0627] ? ?Physical Exam: ?Physical Exam ?HENT:  ?   Head: Normocephalic.  ?   Mouth/Throat:  ?   Mouth: Mucous membranes are moist.  ?Cardiovascular:  ?   Rate and Rhythm: Normal rate and regular rhythm.  ?   Pulses: Normal pulses.  ?   Heart sounds: Normal heart sounds.  ?Pulmonary:  ?   Effort: Pulmonary effort is normal.  ?   Breath sounds: Normal breath sounds.  ?Abdominal:  ?   General: Abdomen is flat. Bowel sounds are  normal. There is no distension.  ?   Palpations: Abdomen is soft.  ?   Tenderness: There is no abdominal tenderness.  ?   Comments: Umbilical hernia present, that is reducible  ?Musculoskeletal:  ?   Comments: Dressing in place of left foot  ?Skin: ?   General: Skin is warm and dry.  ?Neurological:  ?   General: No focal deficit present.  ?   Mental Status: She is alert and oriented to person, place, and time.  ?Psychiatric:  ?   Comments: Anxious and tearful due to worry over possible amputation of foot  ?  ? ?Patient Lines/Drains/Airways Status   ? ? Active Line/Drains/Airways   ? ? Name Placement date Placement time Site Days  ? Peripheral IV 07/07/21 18 G Right Antecubital 07/07/21  0328  Antecubital  1  ? External Urinary Catheter 06/05/21  0000  --  33  ? External Urinary Catheter 07/07/21  2107  --  1  ? Incision (Closed) 05/25/19 Foot 05/25/19  1303  -- 775  ? Incision (Closed) 04/24/21 Ankle Left 04/24/21  2259  -- 75  ? Pressure Injury 05/31/21 Sacrum Mid;Lower Stage 2 -  Partial thickness loss of dermis presenting as a shallow open injury with a red, pink wound bed without slough. denuded area between  buttock on sacrum-pink and peeling 05/31/21  0300  -- 38  ? Wound / Incision (Open or Dehisced) 04/23/21 Other (Comment) Ankle Left full thickness wound 04/23/21  --  Ankle  76  ? Wound / Incision (Open or Dehisced) 04/23/21 Other (Comment) Foot Left full thickness wound 04/23/21  --  Foot  76  ? Wound / Incision (Open or Dehisced) 04/26/21 Skin tear Thigh Left;Posterior Pinched while on [facility] 04/26/21  2306  Thigh  73  ? ?  ?  ? ?  ? ? ?Pertinent Labs: ? ?  Latest Ref Rng & Units 07/08/2021  ?  5:00 AM 07/07/2021  ?  3:25 AM 07/03/2021  ? 12:50 PM  ?CBC  ?WBC 4.0 - 10.5 K/uL 7.3   11.5   12.4    ?Hemoglobin 12.0 - 15.0 g/dL 8.6   10.6   10.9    ?Hematocrit 36.0 - 46.0 % 26.9   33.6   34.3    ?Platelets 150 - 400 K/uL 480   610     ? ? ? ?  Latest Ref Rng & Units 07/08/2021  ?  5:00 AM 07/07/2021  ?  3:25 AM  07/03/2021  ? 12:50 PM  ?CMP  ?Glucose 70 - 99 mg/dL 306   198   173    ?BUN 8 - 23 mg/dL '11   12   13    '$ ?Creatinine 0.44 - 1.00 mg/dL 0.99   1.33   1.21    ?Sodium 135 - 145 mmol/L 137   132   140    ?Potassium 3.5 - 5.1 mmol/L 3.5   3.5   4.2    ?Chloride 98 - 111 mmol/L 107   96   100    ?CO2 22 - 32 mmol/L '21   20   20    '$ ?Calcium 8.9 - 10.3 mg/dL 8.2   8.6   9.5    ?Total Protein 6.5 - 8.1 g/dL  7.1   6.9    ?Total Bilirubin 0.3 - 1.2 mg/dL  0.9   0.6    ?Alkaline Phos 38 - 126 U/L  97   123    ?AST 15 - 41 U/L  17   11    ?ALT 0 - 44 U/L  11   11    ? ? ?Recent Labs  ?  07/07/21 ?1659 07/07/21 ?2157  ?GLUCAP 154* 196*  ?  ? ?ASSESSMENT/PLAN:  ?Assessment: ? ?Principal Problem: ?  Osteomyelitis of left foot ([place]) ?Active Problems: ?  Panhypopituitarism ([place]) ?  Diabetes mellitus, type 2 ([facility]) ?  Essential hypertension ?  PAD (peripheral artery disease) ([place]) ? ? ? ?Tammy Good is a 71 y.o. 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  ? 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 ?MRI showed new osteomyelitis as well as progression of known osteomyelitis.  Appreciate orthopedic surgery assistance with this case.  [name] evaluated patient this morning and planned to see if vascular intervention would be helpful prior to considering amputation.  Vascular surgery has been consulted. ? ?-Vancomycin, day 2 ?-Cefepime, day 2 ?-Orthopedic surgery consulted, [name].  Appreciate their assistance in this case. ?-Vascular surgery ?-Blood cultures pending, ngtd 24 hours ? ?#  AKI ?Creatinine initially elevated at 1.33 on admission.  This improved with IVF to 0.99 this morning. ? ?-Trend  BMP ? ?#Acquired panhypopituitarism ?Home medications include levothyroxine 125 mcg and dexamethasone 0.5 qd.  We will give stress dosing of steroids for 3 days. ? ?-Hydrocortisone 20 mg in the morning and 10 mg at night, day 2 ?-Synthroid 125 mcg ? ?#Insulin- dependent diabetes ?Medications include glargine 42 units and NovoLog sliding scale, Farxiga.  She is on chronic steroids and receiving stress dose steroids here. ? ?-Glargine 30 units nightly ?-SSI ? ?#Thrombocytosis ?Platelets was elevated at 610 on admission. Likely reactive in setting of chronic infection. ? ?-Trend CBC ? ?#Normocytic anemia ?#History of Non-bleeding esophageal ulcer ?Hemoglobin at 8.6 this morning, down from 10.6.  History of prior hospitalization due to GI bleed several months ago.  No signs of hemoptysis or melena.  Thinking this was likely hemodilution in the setting of hypovolemia with her recent vomiting episodes.  Medications include pantoprazole 40 mg twice daily ? ?-Trend CBC ? ?#Paroxysmal A-fib ?Medications include Eliquis.  This is being held in the setting of possible surgery. ? ? ? ?Best Practice: ?Diet: carb modified ?IVF: Fluids: none ?VTE:   enoxaparin ?Code: Full ?AB: Vancomycin, Cefepime ?Therapy Recs: Pending ?Family Contact: Called patient's daughter and update via phone ?DISPO: Anticipated discharge in 3-5 days to Home pending possible revascularization or orthopedic surgery. ? ?Signature: ?Daleen Bo. Sonia Bromell, D.O.  ?Internal Medicine Resident, PGY-1 ?[facility]  ?Pager: [phone] ?6:27 AM, 07/08/2021  ? ?Please contact the on call pager after 5 pm and on weekends at [phone].  ?

## 2021-07-08 NOTE — Hospital Course (Addendum)
Osteomyelitis of left calcaneus, cuboid, talar head, and navicular status post bone biopsy with irrigation debridement ?Septic arthritis of left calcaneocuboid and talar navicular joints ?She has been dealing with this infection since January and has been following with podiatry/infectious disease outpatient. Patient presented with systemic signs of infection including nausea and vomiting.  MRI showed new osteomyelitis in the midfoot and left ankle.  Mutation versus repeat bone biopsy with I&D and a prolonged antibiotic course discussed with patient.  She chose to do bone biopsy with antibiotics.  Podiatry talked with patient that there is still is high likely hood that she will need to undergo amputation of her foot.  I&D with bone biopsy completed on 4/24 with cultures sent.  Surgical by ophthalmology showed chronic osteomyelitis of cuboid, acute myelitis of calcaneus, and chronic osteomyelitis of navicular.  Cultures are pending.  Patient will follow-up with Dr. Linus Salmons for ID on May 12th and [name] in one week following discharge. ? ?PAD status post left SFA stenting in 2021 ?She had reduced ABIs of 50% with monophasic waveforms.  Arteriogram completed 4/20, last SFA was treated with stenting and angioplasty.  She was started on clopidogrel 75 mg and atorvastatin 40 mg.  ? ?AKI ?Initially elevated, but improved to baseline with fluid resuscitation. ? 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  ? ?Insulin-dependent diabetes ?Medications include Glargine 42 units of NovoLog for mealtime coverage.  Had several hypoglycemic episodes and long-acting insulin was discontinued.  She was instructed to continue checking morning blood sugars and if greater than 182 restart glargine at 20 units. ? ?Thrombocytosis ?Platelets elevated at 71 admission.  Likely reactive in the setting of chronic infection. ? ?Normocytic anemia ?History  of nonbleeding esophageal ulcer ?History of prior hospitalization due to GI bleed several months ago.  No signs of hemoptysis or melena throughout hospitalization. ? ?History of atrial fibrillation ?Medications include Eliquis.  This was continued following surgery. ? ?

## 2021-07-09 ENCOUNTER — Telehealth: Payer: Self-pay | Admitting: Podiatry

## 2021-07-09 DIAGNOSIS — M86172 Other acute osteomyelitis, left ankle and foot: Secondary | ICD-10-CM | POA: Diagnosis not present

## 2021-07-09 DIAGNOSIS — M86672 Other chronic osteomyelitis, left ankle and foot: Secondary | ICD-10-CM | POA: Diagnosis not present

## 2021-07-09 LAB — CBC
HCT: 28.9 % — ABNORMAL LOW (ref 36.0–46.0)
Hemoglobin: 9 g/dL — ABNORMAL LOW (ref 12.0–15.0)
MCH: 27 pg (ref 26.0–34.0)
MCHC: 31.1 g/dL (ref 30.0–36.0)
MCV: 86.8 fL (ref 80.0–100.0)
Platelets: 497 10*3/uL — ABNORMAL HIGH (ref 150–400)
RBC: 3.33 MIL/uL — ABNORMAL LOW (ref 3.87–5.11)
RDW: 18.6 % — ABNORMAL HIGH (ref 11.5–15.5)
WBC: 8.7 10*3/uL (ref 4.0–10.5)
nRBC: 0 % (ref 0.0–0.2)

## 2021-07-09 LAB — GLUCOSE, CAPILLARY
Glucose-Capillary: 205 mg/dL — ABNORMAL HIGH (ref 70–99)
Glucose-Capillary: 222 mg/dL — ABNORMAL HIGH (ref 70–99)
Glucose-Capillary: 152 mg/dL — ABNORMAL HIGH (ref 70–99)
Glucose-Capillary: 165 mg/dL — ABNORMAL HIGH (ref 70–99)
Glucose-Capillary: 212 mg/dL — ABNORMAL HIGH (ref 70–99)

## 2021-07-09 LAB — BASIC METABOLIC PANEL
Anion gap: 11 (ref 5–15)
BUN: 12 mg/dL (ref 8–23)
CO2: 23 mmol/L (ref 22–32)
Calcium: 8 mg/dL — ABNORMAL LOW (ref 8.9–10.3)
Chloride: 101 mmol/L (ref 98–111)
Creatinine, Ser: 1.03 mg/dL — ABNORMAL HIGH (ref 0.44–1.00)
GFR, Estimated: 59 mL/min — ABNORMAL LOW (ref >60)
Glucose, Bld: 271 mg/dL — ABNORMAL HIGH (ref 70–99)
Potassium: 3.7 mmol/L (ref 3.5–5.1)
Sodium: 135 mmol/L (ref 135–145)

## 2021-07-09 LAB — MRSA NEXT GEN BY PCR, NASAL: MRSA by PCR Next Gen: DETECTED — AB

## 2021-07-09 MED ORDER — HYDROCORTISONE 10 MG PO TABS
10.0000 mg | ORAL_TABLET | Freq: Every day | ORAL | Status: DC
  Administered 2021-07-10 – 2021-07-12 (×2): 10 mg via ORAL
  Filled 2021-07-09 (×4): qty 1

## 2021-07-09 MED ORDER — MUPIROCIN 2 % EX OINT
1.0000 "application " | TOPICAL_OINTMENT | Freq: Two times a day (BID) | CUTANEOUS | Status: AC
  Administered 2021-07-09 – 2021-07-13 (×9): 1 via NASAL
  Filled 2021-07-09 (×3): qty 22

## 2021-07-09 MED ORDER — HYDROCORTISONE 5 MG PO TABS
5.0000 mg | ORAL_TABLET | Freq: Every day | ORAL | Status: DC
  Administered 2021-07-09 – 2021-07-12 (×4): 5 mg via ORAL
  Filled 2021-07-09 (×4): qty 1

## 2021-07-09 MED ORDER — INSULIN ASPART 100 UNIT/ML IJ SOLN
0.0000 [IU] | Freq: Three times a day (TID) | INTRAMUSCULAR | Status: DC
  Administered 2021-07-09: 7 [IU] via SUBCUTANEOUS
  Administered 2021-07-09 – 2021-07-13 (×4): 4 [IU] via SUBCUTANEOUS
  Administered 2021-07-14 – 2021-07-15 (×2): 3 [IU] via SUBCUTANEOUS
  Administered 2021-07-16: 7 [IU] via SUBCUTANEOUS
  Administered 2021-07-17: 4 [IU] via SUBCUTANEOUS
  Administered 2021-07-17: 7 [IU] via SUBCUTANEOUS
  Administered 2021-07-17: 3 [IU] via SUBCUTANEOUS

## 2021-07-09 MED ORDER — CHLORHEXIDINE GLUCONATE CLOTH 2 % EX PADS
6.0000 | MEDICATED_PAD | Freq: Every day | CUTANEOUS | Status: AC
  Administered 2021-07-10 – 2021-07-14 (×5): 6 via TOPICAL

## 2021-07-09 MED ORDER — INSULIN GLARGINE-YFGN 100 UNIT/ML ~~LOC~~ SOLN
40.0000 [IU] | Freq: Every day | SUBCUTANEOUS | Status: DC
Start: 2021-07-09 — End: 2021-07-10
  Administered 2021-07-09: 40 [IU] via SUBCUTANEOUS
  Filled 2021-07-09 (×2): qty 0.4

## 2021-07-09 MED ORDER — ENOXAPARIN SODIUM 40 MG/0.4ML IJ SOSY
40.0000 mg | PREFILLED_SYRINGE | INTRAMUSCULAR | Status: DC
  Administered 2021-07-10: 40 mg via SUBCUTANEOUS
  Filled 2021-07-09 (×2): qty 0.4

## 2021-07-09 MED ORDER — HYDROCORTISONE 10 MG PO TABS
10.0000 mg | ORAL_TABLET | Freq: Once | ORAL | Status: AC
  Administered 2021-07-09: 10 mg via ORAL
  Filled 2021-07-09: qty 1

## 2021-07-09 NOTE — Plan of Care (Signed)
?  Problem: Health Behavior/Discharge Planning: Goal: Ability to manage health-related needs will improve Outcome: Completed/Met   

## 2021-07-09 NOTE — Evaluation (Signed)
Physical Therapy Evaluation ?Patient Details ?Name: Tammy Good ?MRN: [id number] ?DOB: 1950-09-29 ?Today's Date: 07/09/2021 ? ?History of Present Illness ? Pt is a 71 y/o female admitted for management of acute on chronic osteomyelitis of the left ankle and midfoot. Pt with suspected recurrent SFA disease. Plan for anteriogram with possible L LE intervention 4/20. 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  ?Clinical Impression ?  ?Pt admitted with above diagnosis. Lives at home alone, in a single-level home with 4 steps to enter; Prior to admission, pt was able to manage independently, still driving, uses power carts at stores; had been using RW more leading up to this admission due to weakness; Presents to PT with generalized weakness, functional dependencies;  Pt currently with functional limitations due to the deficits listed below (see PT Problem List). Pt will benefit from skilled PT to increase their independence and safety with mobility to allow discharge to the venue listed below.      ?   ? ?Recommendations for follow up therapy are one component of a multi-disciplinary discharge planning process, led by the attending physician.  Recommendations may be updated based on patient status, additional functional criteria and insurance authorization. ? ?Follow Up Recommendations Home health PT ? ?  ?Assistance Recommended at Discharge Set up Supervision/Assistance  ?Patient can return home with the following ? Assist for transportation;Help with stairs or ramp for entrance;Assistance with cooking/housework ? ?  ?Equipment Recommendations Rolling walker (2 wheels) (may already ahve one)  ?Recommendations for Other Services ? OT consult (as ordered)  ?  ?Functional Status Assessment Patient has had a recent decline in their functional status and demonstrates the ability to make significant improvements in function in a reasonable and predictable amount of time.  ? ?  ?Precautions / Restrictions  Precautions ?Precautions: Fall  ? ?  ? ?Mobility ? Bed Mobility ?Overal bed mobility: Needs Assistance ?Bed Mobility: Supine to Sit ?  ?  ?Supine to sit: Min assist ?  ?  ?General bed mobility comments: Min handheld assist to pull to sit; dependent on momentum ?  ? ?Transfers ?Overall transfer level: Needs assistance ?Equipment used: None ?Transfers: Sit to/from Stand, Bed to chair/wheelchair/BSC ?Sit to Stand: Min guard ?  ?Step pivot transfers: Min guard (simultaed steps pivot with sidestepping to [facility]) ?  ?  ?  ?General transfer comment: Handheld assist to steady ?  ? ?Ambulation/Gait ?  ?  ?  ?  ?  ?  ?  ?  ? ?Stairs ?  ?  ?  ?  ?  ? ?Wheelchair Mobility ?  ? ?Modified Rankin (Stroke Patients Only) ?  ? ?  ? ?Balance   ?  ?Sitting balance-Leahy Scale: Good ?  ?  ?  ?Standing balance-Leahy Scale: Fair ?  ?  ?  ?  ?  ?  ?  ?  ?  ?  ?  ?  ?   ? ? ? ?Pertinent Vitals/Pain Pain Assessment ?Pain Assessment: No/denies pain  ? ? ?Home Living Family/patient expects to be discharged to:: Private residence ?Living Arrangements: Alone ?Available Help at Discharge: Family;Available PRN/intermittently ?Type of Home: House ?Home Access: Stairs to enter ?Entrance Stairs-Rails: Left ?Entrance Stairs-Number of Steps: 3-4 at the front, 4 through the garage (typical entrance) ?  ?Home Layout: One level ?Home Equipment: Shower seat;Cane - single point;Rolling Walker (2 wheels);Hand held shower head;Toilet riser;BSC/3in1 ?   ?  ?Prior Function Prior Level of Function : Independent/Modified Independent;Driving ?  ?  ?  ?  ?  ?  ?  Mobility Comments: uses cane most of the time but will occasionally use RW in the home or out in community. uses electric shopping carts in stores ?ADLs Comments: has been sponge bathing since L foot issues. Mod I for ADLs, IADLs, drives to appts and stores ?  ? ? ?Hand Dominance  ? Dominant Hand: Right ? ?  ?Extremity/Trunk Assessment  ? Upper Extremity Assessment ?Upper Extremity Assessment: Defer to OT  evaluation ?  ? ?Lower Extremity Assessment ?Lower Extremity Assessment: Generalized weakness ?  ? ?   ?Communication  ? Communication: No difficulties  ?Cognition Arousal/Alertness: Awake/alert ?Behavior During Therapy: [facility] for tasks assessed/performed ?Overall Cognitive Status: Within Functional Limits for tasks assessed ?  ?  ?  ?  ?  ?  ?  ?  ?  ?  ?  ?  ?  ?  ?  ?  ?  ?  ?  ? ?  ?General Comments General comments (skin integrity, edema, etc.): covered wuond on L ankle with clean gauze until nursing can come by to re-dress it ? ?  ?Exercises    ? ?Assessment/Plan  ?  ?PT Assessment Patient needs continued PT services  ?PT Problem List Decreased strength;Decreased activity tolerance;Decreased balance;Decreased mobility;Decreased knowledge of use of DME;Decreased safety awareness ? ?   ?  ?PT Treatment Interventions DME instruction;Gait training;Stair training;Functional mobility training;Therapeutic activities;Therapeutic exercise;Balance training;Patient/family education   ? ?PT Goals (Current goals can be found in the Care Plan section)  ?Acute Rehab PT Goals ?Patient Stated Goal: for healing ?PT Goal Formulation: With patient ?Time For Goal Achievement: 07/23/21 ?Potential to Achieve Goals: Good ? ?  ?Frequency Min 3X/week ?  ? ? ?Co-evaluation   ?  ?  ?  ?  ? ? ?  ?AM-PAC PT "6 Clicks" Mobility  ?Outcome Measure Help needed turning from your back to your side while in a flat bed without using bedrails?: None ?Help needed moving from lying on your back to sitting on the side of a flat bed without using bedrails?: A Little ?Help needed moving to and from a bed to a chair (including a wheelchair)?: A Little ?Help needed standing up from a chair using your arms (e.g., wheelchair or bedside chair)?: A Little ?Help needed to walk in hospital room?: A Little ?Help needed climbing 3-5 steps with a railing? : A Little ?6 Click Score: 19 ? ?  ?End of Session   ?Activity Tolerance: Patient tolerated treatment well  (thoguh tired from lots of OOB activity in am) ?Patient left: in bed;with nursing/sitter in room;with call bell/phone within reach ?Nurse Communication: Mobility status ?PT Visit Diagnosis: Unsteadiness on feet (R26.81);Other abnormalities of gait and mobility (R26.89) ?  ? ?Time: 9892-1194 ?PT Time Calculation (min) (ACUTE ONLY): 21 min ? ? ?Charges:   PT Evaluation ?$PT Eval Moderate Complexity: 1 Mod ?PT Treatments ?$Therapeutic Activity: 8-22 mins ?  ?   ? ? ?Roney Marion, PT  ?Acute Rehabilitation Services ?Office [phone] ? ? ?[name] ?07/09/2021, 2:27 PM ? ?

## 2021-07-09 NOTE — Progress Notes (Signed)
OT Cancellation Note ? ?Patient Details ?Name: Tammy Good ?MRN: [id number] ?DOB: 1950/10/21 ? ? ?Cancelled Treatment:    Reason Eval/Treat Not Completed: Other (comment) Currently with breakfast tray. Will follow-up again for OT eval as schedule permits. ? ?Layla Maw ?07/09/2021, 7:17 AM ?

## 2021-07-09 NOTE — Evaluation (Signed)
Occupational Therapy Evaluation ?Patient Details ?Name: Tammy Good ?MRN: [id number] ?DOB: Dec 07, 1950 ?Today's Date: 07/09/2021 ? ? 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 Pt with suspected recurrent SFA disease. Plan for anteriogram with possible L LE intervention 4/20. 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  ? ?Clinical Impression ?  ?PTA, pt lives alone, typically Modified Independent with ADLs, IADLs and mobility with cane vs RW use. Pt presents now fairly close to ADL baseline with Supervision at most this AM. Pt with good self monitoring, safety awareness, and desire to maintain independence. Anticipate no OT needs at DC but will continue to follow acutely to monitor any functional changes s/p interventions this week.   ?   ? ?Recommendations for follow up therapy are one component of a multi-disciplinary discharge planning process, led by the attending physician.  Recommendations may be updated based on patient status, additional functional criteria and insurance authorization.  ? ?Follow Up Recommendations ? No OT follow up  ?  ?Assistance Recommended at Discharge PRN  ?Patient can return home with the following Assist for transportation;Help with stairs or ramp for entrance ? ?  ?Functional Status Assessment ? Patient has had a recent decline in their functional status and demonstrates the ability to make significant improvements in function in a reasonable and predictable amount of time.  ?Equipment Recommendations ? None recommended by OT  ?  ?Recommendations for Other Services   ? ? ?  ?Precautions / Restrictions Precautions ?Precautions: Fall ?Restrictions ?Weight Bearing Restrictions: No  ? ?  ? ?Mobility Bed Mobility ?Overal bed mobility: Modified Independent ?Bed Mobility: Supine to Sit ?  ?  ?Supine to sit: Modified independent (Device/Increase time) ?  ?  ?  ?  ? ?Transfers ?Overall  transfer level: Needs assistance ?Equipment used: None ?Transfers: Sit to/from Stand, Bed to chair/wheelchair/BSC ?Sit to Stand: Supervision ?  ?  ?Step pivot transfers: Supervision ?  ?  ?  ?  ? ?  ?Balance Overall balance assessment: Needs assistance ?Sitting-balance support: No upper extremity supported, Feet supported ?Sitting balance-Leahy Scale: Good ?  ?  ?Standing balance support: No upper extremity supported, During functional activity ?Standing balance-Leahy Scale: Good ?  ?  ?  ?  ?  ?  ?  ?  ?  ?  ?  ?  ?   ? ?ADL either performed or assessed with clinical judgement  ? ?ADL Overall ADL's : Needs assistance/impaired ?Eating/Feeding: Independent ?  ?Grooming: Set up;Standing ?  ?Upper Body Bathing: Independent ?  ?Lower Body Bathing: Set up;Sit to/from stand ?  ?Upper Body Dressing : Independent ?  ?Lower Body Dressing: Set up ?Lower Body Dressing Details (indicate cue type and reason): able to don socks, mesh underwear and pad without physical assist sitting EOB ?Toilet Transfer: Supervision/safety;Stand-pivot ?  ?Toileting- Clothing Manipulation and Hygiene: Set up;Sit to/from stand;Sitting/lateral lean ?  ?  ?  ?  ?General ADL Comments: Pt with good awareness of safety precautions, pacing, energy conservation and task modification in daily routine. Not limited by pain currently but noted planned interventions this week that could impact daily functioning  ? ? ? ?Vision Ability to See in Adequate Light: 0 Adequate ?Patient Visual Report: No change from baseline ?Vision Assessment?: No apparent visual deficits  ?   ?Perception   ?  ?Praxis   ?  ? ?Pertinent Vitals/Pain Pain Assessment ?Pain Assessment: No/denies  pain  ? ? ? ?Hand Dominance Right ?  ?Extremity/Trunk Assessment Upper Extremity Assessment ?Upper Extremity Assessment: Overall WFL for tasks assessed ?  ?Lower Extremity Assessment ?Lower Extremity Assessment: Defer to PT evaluation ?  ?Cervical / Trunk Assessment ?Cervical / Trunk Assessment:  Normal ?  ?Communication Communication ?Communication: No difficulties ?  ?Cognition Arousal/Alertness: Awake/alert ?Behavior During Therapy: [facility] for tasks assessed/performed ?Overall Cognitive Status: Within Functional Limits for tasks assessed ?  ?  ?  ?  ?  ?  ?  ?  ?  ?  ?  ?  ?  ?  ?  ?  ?  ?  ?  ?General Comments  Pt reports sponge bathing due to L foot difficulties and would like to shower at home. educated on possibility of wrapping L foot in plastic wrapping but encouraged discussions with MD, especially after interventions this week ? ?  ?Exercises   ?  ?Shoulder Instructions    ? ? ?Home Living Family/patient expects to be discharged to:: Private residence ?Living Arrangements: Alone ?Available Help at Discharge: Family;Available PRN/intermittently ?Type of Home: House ?Home Access: Stairs to enter ?Entrance Stairs-Number of Steps: 3-4 at the front, 4 through the garage (typical entrance) ?Entrance Stairs-Rails: Left ?Home Layout: One level ?  ?  ?Bathroom Shower/Tub: Walk-in shower ?  ?Bathroom Toilet: Standard (riser over top) ?  ?  ?Home Equipment: Shower seat;Cane - single point;Rolling Walker (2 wheels);Hand held shower head;Toilet riser;BSC/3in1 ?  ?  ?  ? ?  ?Prior Functioning/Environment Prior Level of Function : Independent/Modified Independent;Driving ?  ?  ?  ?  ?  ?  ?Mobility Comments: uses cane most of the time but will occasionally use RW in the home or out in community. uses electric shopping carts in stores ?ADLs Comments: has been sponge bathing since L foot issues. Mod I for ADLs, IADLs, drives to appts and stores ?  ? ?  ?  ?OT Problem List: Decreased activity tolerance;Impaired balance (sitting and/or standing) ?  ?   ?OT Treatment/Interventions: Self-care/ADL training;Therapeutic exercise;DME and/or AE instruction;Energy conservation;Therapeutic activities  ?  ?OT Goals(Current goals can be found in the care plan section) Acute Rehab OT Goals ?Patient Stated Goal: avoid amputation if  possible, maintain independence ?OT Goal Formulation: With patient ?Time For Goal Achievement: 07/23/21 ?Potential to Achieve Goals: Good  ?OT Frequency: Min 2X/week ?  ? ?Co-evaluation   ?  ?  ?  ?  ? ?  ?AM-PAC OT "6 Clicks" Daily Activity     ?Outcome Measure Help from another person eating meals?: None ?Help from another person taking care of personal grooming?: A Little ?Help from another person toileting, which includes using toliet, bedpan, or urinal?: A Little ?Help from another person bathing (including washing, rinsing, drying)?: A Little ?Help from another person to put on and taking off regular upper body clothing?: None ?Help from another person to put on and taking off regular lower body clothing?: A Little ?6 Click Score: 20 ?  ?End of Session Nurse Communication: Mobility status ? ?Activity Tolerance: Patient tolerated treatment well ?Patient left: in chair;with call bell/phone within reach;with chair alarm set ? ?OT Visit Diagnosis: Other abnormalities of gait and mobility (R26.89)  ?              ?Time: 0998-3382 ?OT Time Calculation (min): 32 min ?Charges:  OT General Charges ?$OT Visit: 1 Visit ?OT Evaluation ?$OT Eval Low Complexity: 1 Low ?OT Treatments ?$Self Care/Home Management : 8-22 mins ? ?  Malachy Chamber, OTR/L ?Acute Rehab Services ?Office: [phone]  ? ?Layla Maw ?07/09/2021, 9:05 AM ?

## 2021-07-09 NOTE — Progress Notes (Addendum)
? ?HD#2 ?SUBJECTIVE:  ?Patient Summary: Tammy Good is a 71 y.o. 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  ? ?Overnight Events: none ? ?Interim History: Patient assessed at bedside this AM.  She states that she does not have any pain in her foot.  She has not had further episodes of nausea or vomiting.  We talked about what she discussed with [name] and vascular surgery.  She endorses understanding she will be receiving procedure on Thursday. No other concerns at this time. ? ?OBJECTIVE:  ?Vital Signs: ?Vitals:  ? 07/08/21 0953 07/08/21 1814 07/08/21 2105 07/09/21 4742  ?BP: 110/84 117/67 123/62 122/85  ?Pulse: 81 97 94 81  ?Resp: '18 16 17 18  '$ ?Temp: 98.5 ?F (36.9 ?C) (!) 97.5 ?F (36.4 ?C) (!) 97.5 ?F (36.4 ?C) 98.1 ?F (36.7 ?C)  ?TempSrc:   Oral   ?SpO2: 100% 96% 98% 100%  ?Weight:      ?Height:      ? ?Supplemental O2: Room Air ?SpO2: 100 % ?O2 Flow Rate (L/min): 2 L/min ? ?Filed Weights  ? 07/07/21 0630 07/07/21 0804 07/07/21 2107  ?Weight: 101 kg 86.2 kg 86.9 kg  ? ? ? ?Intake/Output Summary (Last 24 hours) at 07/09/2021 0618 ?Last data filed at 07/09/2021 5956 ?Gross per 24 hour  ?Intake 454 ml  ?Output 950 ml  ?Net -496 ml  ? ? ?Net IO Since Admission: 344 mL [07/09/21 0618] ? ?Physical Exam: ?Physical Exam ?HENT:  ?   Head: Normocephalic.  ?   Mouth/Throat:  ?   Mouth: Mucous membranes are moist.  ?Cardiovascular:  ?   Rate and Rhythm: Normal rate and regular rhythm.  ?   Pulses: Normal pulses.  ?   Heart sounds: Normal heart sounds.  ?Pulmonary:  ?   Effort: Pulmonary effort is normal.  ?   Breath sounds: Normal breath sounds.  ?Abdominal:  ?   General: Abdomen is flat. Bowel sounds are normal. There is no distension.  ?   Palpations: Abdomen is soft.  ?   Tenderness: There is no abdominal tenderness.  ?    Comments: Umbilical hernia present, that is reducible  ?Musculoskeletal:  ?   Comments: No erythema, edema, or purulence present at lesion on left lateral malleolus  ?Skin: ?   General: Skin is warm and dry.  ?Neurological:  ?   General: No focal deficit present.  ?   Mental Status: She is alert and oriented to person, place, and time.  ?  ? ?Patient Lines/Drains/Airways Status   ? ? Active Line/Drains/Airways   ? ? Name Placement date Placement time Site Days  ? Peripheral IV 07/07/21 18 G Right Antecubital 07/07/21  0328  Antecubital  1  ? External Urinary Catheter 06/05/21  0000  --  33  ? External Urinary Catheter 07/07/21  2107  --  1  ? Incision (Closed) 05/25/19 Foot 05/25/19  1303  -- 775  ? Incision (Closed) 04/24/21 Ankle Left 04/24/21  2259  -- 75  ? Pressure Injury 05/31/21 Sacrum Mid;Lower Stage 2 -  Partial thickness loss of dermis presenting as a shallow open injury with a red, pink wound bed without slough. denuded area between buttock on sacrum-pink and peeling 05/31/21  0300  -- 38  ? Wound / Incision (Open or Dehisced) 04/23/21 Other (Comment) Ankle Left full  thickness wound 04/23/21  --  Ankle  76  ? Wound / Incision (Open or Dehisced) 04/23/21 Other (Comment) Foot Left full thickness wound 04/23/21  --  Foot  76  ? Wound / Incision (Open or Dehisced) 04/26/21 Skin tear Thigh Left;Posterior Pinched while on [facility] 04/26/21  2306  Thigh  73  ? ?  ?  ? ?  ? ? ?Pertinent Labs: ? ?  Latest Ref Rng & Units 07/09/2021  ?  2:41 AM 07/08/2021  ?  5:00 AM 07/07/2021  ?  3:25 AM  ?CBC  ?WBC 4.0 - 10.5 K/uL 8.7   7.3   11.5    ?Hemoglobin 12.0 - 15.0 g/dL 9.0   8.6   10.6    ?Hematocrit 36.0 - 46.0 % 28.9   26.9   33.6    ?Platelets 150 - 400 K/uL 497   480   610    ? ? ? ?  Latest Ref Rng & Units 07/09/2021  ?  2:41 AM 07/08/2021  ?  5:00 AM 07/07/2021  ?  3:25 AM  ?CMP  ?Glucose 70 - 99 mg/dL 271   306   198    ?BUN 8 - 23 mg/dL '12   11   12    '$ ?Creatinine 0.44 - 1.00 mg/dL 1.03   0.99   1.33    ?Sodium 135 - 145  mmol/L 135   137   132    ?Potassium 3.5 - 5.1 mmol/L 3.7   3.5   3.5    ?Chloride 98 - 111 mmol/L 101   107   96    ?CO2 22 - 32 mmol/L '23   21   20    '$ ?Calcium 8.9 - 10.3 mg/dL 8.0   8.2   8.6    ?Total Protein 6.5 - 8.1 g/dL   7.1    ?Total Bilirubin 0.3 - 1.2 mg/dL   0.9    ?Alkaline Phos 38 - 126 U/L   97    ?AST 15 - 41 U/L   17    ?ALT 0 - 44 U/L   11    ? ? ?Recent Labs  ?  07/08/21 ?1155 07/08/21 ?1703 07/08/21 ?2104  ?GLUCAP 161* 265* 328*  ? ?  ? ?ASSESSMENT/PLAN:  ?Assessment: ? ?Principal Problem: ?  Osteomyelitis of left foot ([place]) ?Active Problems: ?  Panhypopituitarism ([place]) ?  Diabetes mellitus, type 2 ([place]) ?  Essential hypertension ?  PAD (peripheral artery disease) ([place]) ? ? ? ?Tammy Good is a 71 y.o. 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  ? 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 ?Imaging showed signs concerning for progression of known osteomyelitis and new osteomyelitis.  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  Vascular surgery is following and plan to complete arteriogram on Thursday, 4/20.  ID consulted today to provide guidance on IV antibiotics moving forward. ? ?-Vancomycin, day 3 ?-Ceftriaxone, day 3 ?-Follow-up on ID recommendations ?-Orthopedic surgery consulted, [name].  Appreciate their assistance in this case. ?-Vascular surgery planning for arteriogram 4/20 ?-Blood cultures pending, ngtd 24 hours ? ?#AKI ?Creatinine initially elevated at 1.33 on admission. Creatinine at 1.03 today. ? ?-Trend  BMP ? ?#Acquired panhypopituitarism ?Home medications include levothyroxine 125 mcg and  dexamethasone 0.5 qd.  This is her third day of stress dosing.  Will begin to titrate back to home dose. ? ?-Hydrocortisone 20 mg in the morning and 10 mg at night, day 3 ?-Synthroid 125 mcg ? ?#Insulin- dependent diabetes ?Medications include glargine 42 units and NovoLog sliding scale, Farxiga.  She is on chronic steroids and receiving stress dose steroids here. ? ?-Glargine 40 units nightly ?-SSI, resistant ? ?#Thrombocytosis ?Platelets was elevated at 610 on admission. Likely reactive in setting of chronic infection. ? ?-Trend CBC ? ?#Normocytic anemia ?#History of Non-bleeding esophageal ulcer ?Hemoglobin at 8.6 this morning, down from 10.6.  History of prior hospitalization due to GI bleed several months ago.  No signs of hemoptysis or melena.  Thinking this was likely hemodilution in the setting of hypovolemia with her recent vomiting episodes.  Medications include pantoprazole 40 mg twice daily ? ?-Trend CBC ? ?#Paroxysmal A-fib ?Medications include Eliquis.  This is being held in the setting of possible surgery. ? ?-ok to hold until after surgery ? ? ? ?Best Practice: ?Diet: carb modified ?IVF: Fluids: none ?VTE:  VTE prophylaxis Enoxaparin ?Code: Full ?AB: Vancomycin, Cefepime ?Therapy Recs: Pending ?Family Contact: Unable to reach daughter via phone today ?DISPO: Anticipated discharge in 2-3 days to Home pending possible revascularization or orthopedic surgery. ? ?Signature: ?Daleen Bo. Kerilyn Cortner, D.O.  ?Internal Medicine Resident, PGY-1 ?[facility]  ?Pager: [phone] ?6:18 AM, 07/09/2021  ? ?Please contact the on call pager after 5 pm and on weekends at [phone].  ?

## 2021-07-09 NOTE — Telephone Encounter (Signed)
Pt cxled appt for Friday due to her being in the hospital. She is been seen by infectious disease and they are trying to get vascular studies for her. She said they are talking about amputation. ?

## 2021-07-09 NOTE — Progress Notes (Addendum)
?  Progress Note ? ? ? ?07/09/2021 ?7:41 AM ?* No surgery found * ? ?Subjective:  no complaints ? ? ?Vitals:  ? 07/08/21 2105 07/09/21 1610  ?BP: 123/62 122/85  ?Pulse: 94 81  ?Resp: 17 18  ?Temp: (!) 97.5 ?F (36.4 ?C) 98.1 ?F (36.7 ?C)  ?SpO2: 98% 100%  ? ?Physical Exam: ?Extremities:  L ankle wound with devitalized tissue, some fibrinous exudate ?Neurologic: A&O ? ?CBC ?   ?Component Value Date/Time  ? WBC 8.7 07/09/2021 0241  ? RBC 3.33 (L) 07/09/2021 0241  ? HGB 9.0 (L) 07/09/2021 0241  ? HGB 10.9 (L) 07/03/2021 1250  ? HCT 28.9 (L) 07/09/2021 0241  ? HCT 34.3 07/03/2021 1250  ? PLT 497 (H) 07/09/2021 0241  ? PLT 381 12/21/2020 1524  ? MCV 86.8 07/09/2021 0241  ? MCV 85 07/03/2021 1250  ? MCH 27.0 07/09/2021 0241  ? MCHC 31.1 07/09/2021 0241  ? RDW 18.6 (H) 07/09/2021 0241  ? RDW 17.1 (H) 07/03/2021 1250  ? LYMPHSABS 1.4 07/07/2021 0325  ? LYMPHSABS 1.1 07/03/2021 1250  ? MONOABS 0.8 07/07/2021 0325  ? EOSABS 0.2 07/07/2021 0325  ? EOSABS 0.1 07/03/2021 1250  ? BASOSABS 0.1 07/07/2021 0325  ? BASOSABS 0.1 07/03/2021 1250  ? ? ?BMET ?   ?Component Value Date/Time  ? NA 135 07/09/2021 0241  ? NA 140 07/03/2021 1250  ? K 3.7 07/09/2021 0241  ? CL 101 07/09/2021 0241  ? CO2 23 07/09/2021 0241  ? GLUCOSE 271 (H) 07/09/2021 0241  ? BUN 12 07/09/2021 0241  ? BUN 13 07/03/2021 1250  ? CREATININE 1.03 (H) 07/09/2021 0241  ? CREATININE 1.12 (H) 05/24/2021 1405  ? CALCIUM 8.0 (L) 07/09/2021 0241  ? GFRNONAA 59 (L) 07/09/2021 0241  ? GFRAA 21 (L) 06/23/2019 1530  ? ? ?INR ?   ?Component Value Date/Time  ? INR 1.7 (H) 07/08/2021 0500  ? ? ? ?Intake/Output Summary (Last 24 hours) at 07/09/2021 0741 ?Last data filed at 07/09/2021 9604 ?Gross per 24 hour  ?Intake 454 ml  ?Output 950 ml  ?Net -496 ml  ? ? ? ?Assessment/Plan:  71 y.o. female is s/p L lateral ankle wound ? ?Plan is for aortogram with left leg run off and possible intervention 4/20 with [name] ?Continue to hold Eliquis ?Continue current wound care ? ? ?Dagoberto Ligas, PA-C ?[facility] ?[phone] ?07/09/2021 ?7:41 AM ? ? ?I have independently interviewed and examined patient and agree with PA assessment and plan above. She is tentatively scheduled for around noon for the above-noted angiogram on Thursday. ? ?Shariff Lasky C. Donzetta Matters, MD ?[facility] ?Office: [phone] ?Pager: [phone] ? ?

## 2021-07-09 NOTE — Telephone Encounter (Signed)
Patient want to make Dr [name] aware that she is in [facility] and will not be able to make her appointment on 07/12/21

## 2021-07-09 NOTE — Progress Notes (Signed)
ANTICOAGULATION CONSULT NOTE - Initial Consult ? ?Pharmacy Consult for Lovenox ?Indication: atrial fibrillation>>VTE prophylaxis ? ?Allergies  ?Allergen Reactions  ? Strawberry Extract Itching, Swelling and Anaphylaxis  ?  Mouth swells and gets itchy  ? ? ?Patient Measurements: ?Height: '5\' 7"'$  (170.2 cm) ?Weight: 86.9 kg (191 lb 9.3 oz) ?IBW/kg (Calculated) : 61.6 ? ? ?Vital Signs: ?Temp: 97.9 ?F (36.6 ?C) (04/18 0901) ?Temp Source: Oral (04/18 0901) ?BP: 124/72 (04/18 0901) ?Pulse Rate: 78 (04/18 0901) ? ?Labs: ?Recent Labs  ?  07/07/21 ?0325 07/08/21 ?0500 07/09/21 ?0241  ?HGB 10.6* 8.6* 9.0*  ?HCT 33.6* 26.9* 28.9*  ?PLT 610* 480* 497*  ?APTT 50*  --   --   ?LABPROT 27.8* 19.5*  --   ?INR 2.6* 1.7*  --   ?CREATININE 1.33* 0.99 1.03*  ? ? ? ?Estimated Creatinine Clearance: 58.3 mL/min (A) (by C-G formula based on SCr of 1.03 mg/dL (H)). ? ? ?Medical History: ?Past Medical History:  ?Diagnosis Date  ? CAD S/P percutaneous coronary angioplasty 08/2013  ? 100% mRCA - PCI Integrity Resolute DES 3.0 mm x 38 mm - 3.35 mm; PTCA of RPA V 2.0 mm x 15 mm  ? Cholesteatoma   ? right  ? Diabetes mellitus type 2 in obese [facility])   ? On insulin and Invokana  ? History of osteomyelitis L 5th Toe all 05/2019  ? s/p Partial Ray Amputation with partial closure; 6 wks Abx & LSFA Atherectomy/DEB PTA with Stent for focal dissection.  ? Hyperlipidemia with target LDL less than 70   ? Hypothyroidism (acquired)   ? Mild essential hypertension   ? Obesity (BMI 30-39.9) 11/17/2013  ? PAD (peripheral artery disease) ([place]) 05/26/2019  ? 05/26/19: Abd AoGram- BLE runoff -> L SFA orbital atherectomy - PTA w/ DEB & Stent 6 x 40 Luttonix (for focal dissection) - patent Pop A with 3 V runoff. LEA Dopplers 01/03/2020: RABI (prev) 0.68 (0.53)/ RTBI (prev) 0.58 (0.33); LABI (prev) 0.80 (0.64), LTBI (prev) 0.64 (0.51); R mSFA ~50-74%, L mSFA 50-74%. Patent Prox SFA stent < 49% stenosis  ? Panhypopituitarism ([place])   ? ST elevation myocardial infarction (STEMI)  of inferior wall, subsequent episode of care [facility]) 08/2013  ? 80% branch of D1, 40% mid AV groove circumflex, 100% RCA with subacute thrombus -- thrombus extending into RPA V with 100% occlusion after initial angioplasty of mid RCA ;; Post MI ECHO 6/9/'15: EF 50-55%, mild LVH with moderate HK of inferior wall, Gr1 DD, mild LA dilation; mildly reduced RV function  ? ? ?Medications:  ?Medications Prior to Admission  ?Medication Sig Dispense Refill Last Dose  ? acetaminophen (TYLENOL) 500 MG tablet Take 500-1,000 mg by mouth 3 (three) times daily as needed for headache (pain).   07/06/2021  ? apixaban (ELIQUIS) 5 MG TABS tablet Take 1 tablet (5 mg total) by mouth 2 (two) times daily. 60 tablet 6 07/06/2021 at 2230  ? atorvastatin (LIPITOR) 40 MG tablet Take 1 tablet (40 mg total) by mouth daily at 6 PM. (Patient taking differently: Take 40 mg by mouth every evening.) 30 tablet 0 07/06/2021  ? collagenase (SANTYL) 250 UNIT/GM ointment Apply 1 application. topically daily. (Patient taking differently: Apply 1 application. topically daily. Left foot) 15 g 0 07/06/2021  ? dapagliflozin propanediol (FARXIGA) 10 MG TABS tablet Take 10 mg by mouth every morning.   07/06/2021  ? dexamethasone (DECADRON) 0.5 MG tablet Take 0.5 mg by mouth every morning.   07/06/2021  ? ferrous sulfate 325 (65  FE) MG tablet Take 325 mg by mouth every evening.   07/06/2021  ? furosemide (LASIX) 20 MG tablet Take 1 tablet (20 mg total) by mouth daily as needed for fluid or edema. 30 tablet 0 unk  ? insulin aspart (NOVOLOG FLEXPEN) 100 UNIT/ML FlexPen Inject 3-8 Units into the skin See admin instructions. Inject 3-8 units subcutaneously up to three times daily per sliding scale - based on carb count   07/06/2021  ? Insulin Glargine (BASAGLAR KWIKPEN) 100 UNIT/ML SOPN Inject 42 Units into the skin at bedtime.   07/05/2021  ? levothyroxine (SYNTHROID) 125 MCG tablet Take 1 tablet (125 mcg total) by mouth daily before breakfast. 30 tablet 2 07/06/2021  ?  linezolid (ZYVOX) 600 MG tablet Take 1 tablet (600 mg total) by mouth 2 (two) times daily. (Patient taking differently: Take 600 mg by mouth See admin instructions. Bid x 7 days) 14 tablet 0 07/06/2021  ? metoCLOPramide (REGLAN) 5 MG tablet Take 1 tablet (5 mg total) by mouth 3 (three) times daily as needed for nausea or vomiting. Take before meals as needed, if having nausea 90 tablet 1 Past Month  ? midodrine (PROAMATINE) 5 MG tablet Take 1 tablet (5 mg total) by mouth 2 (two) times daily with a meal. 60 tablet 1 07/06/2021  ? nitroGLYCERIN (NITROSTAT) 0.4 MG SL tablet Place 1 tablet (0.4 mg total) under the tongue every 5 (five) minutes as needed for chest pain (up to 3 doses. If taking 3rd dose call 911). 25 tablet 3 unk  ? nystatin cream (MYCOSTATIN) Apply 1 application topically 2 (two) times daily. (Patient taking differently: Apply 1 application. topically 2 (two) times daily as needed (rash).) 30 g 1 Past Month  ? Omega-3 Fatty Acids (FISH OIL) 1000 MG CAPS Take 1,000 mg by mouth every morning.   07/06/2021  ? pantoprazole (PROTONIX) 40 MG tablet Take 1 tablet (40 mg total) by mouth 2 (two) times daily before a meal. 60 tablet 0 07/06/2021  ? polyethylene glycol powder (GLYCOLAX/MIRALAX) 17 GM/SCOOP powder Take 1 capful with water (17 g) by mouth daily. 238 g 0 07/06/2021  ? promethazine (PHENERGAN) 12.5 MG tablet Take 1 tablet (12.5 mg total) by mouth every 6 (six) hours as needed for nausea, vomiting or refractory nausea / vomiting. 30 tablet 0 07/06/2021  ? Propylene Glycol (SYSTANE COMPLETE) 0.6 % SOLN Place 1 drop into both eyes daily as needed (dry eyes).   unk  ? B-D UF III MINI PEN NEEDLES 31G X 5 MM MISC Inject into the skin.     ? Continuous Blood Gluc Sensor (FREESTYLE LIBRE 2 SENSOR) MISC Apply topically every 14 (fourteen) days.     ? ? ?Assessment: ?20 YOF N/V and weakness on apixaban PTA for h/o Afib per admit notes. Now told patient has NO h/o afib but was on apixaban for possible h/o VTE per IMTS  MD. Team wants to de-escalate to VTE prophylaxis dosing of lovenox for this indication.   ? ?H/H low, Plt elevated. SCr stable CrCl ~ 58 mL/min  ? ?Goal of Therapy:  ?Monitor platelets by anticoagulation protocol: Yes ?  ?Plan:  ?-Lovenox '40mg'$  SQ q24h starting tomorrow AM ?-Monitor CBC, renal fx and watch for s/s of bleeding  ? ?Alic Hilburn A. Levada Dy, PharmD, BCPS, FNKF ?Clinical Pharmacist ?[place] ?Please utilize Amion for appropriate phone number to reach the unit pharmacist ([place]) ? ? ? ? ?

## 2021-07-09 NOTE — Consult Note (Deleted)
?   ? ? ?[facility] for Infectious Disease   ? ?Date of Admission:  07/07/2021   Total days of inpatient antibiotics 3 ? ?      ?Reason for Consult: Osteomyelitis    ?Principal Problem: ?  Osteomyelitis of left foot ([place]) ?Active Problems: ?  Panhypopituitarism ([place]) ?  Diabetes mellitus, type 2 ([place]) ?  Essential hypertension ?  PAD (peripheral artery disease) ([place]) ? ? ?Assessment: ?71 year old female with history of MRSA bacteremia secondary to left foot infection/osteomyelitis admitted for worsening left foot infection.  She was placed on linezolid 4/13 with podiatry due to worsening wound. ? ? ?#New osteomyelitis of cuboid, talar head, navicular bone, #Progressive osteomyelitis of calcaneus ?#Calcaneocuboid and talonavicular septic arthritis ?#Vertigo associated with Linezolid ?-ABIs showed moderate left lower extremity arterial disease.   ?-Vascular surgery consulted and plan on aortogram with left leg runoff on 4/20.   ?-Orthopedics consulted and awaiting vascular intervention prior to any additional intervention. ? ?#Hx of MRSA bacteremia likely 2/2 foot abscess/osteomyelitis ?-Admitted 2/1-2/4 with MRSA bacteremia, MRI showed increased marrow edema distal fibula concerning for osteomyelitis. Underwent debridement with podiatry and bone Bx with wounds Cx of abscess+ MRSA. Bone bx path showed fibrosis + slight inflammation without necrotic bone.  Pt was treated with 2 weeks of IV vanc->2 week of PO linezolid. Seen in ID clinic on 3/3, noted to have severe vertigo may have been associated with linezolid. Plan was to monitor pt off antibiotics.  ? ?Management: ?-Given pt presented with fever, leukocytosis with Hx of recent MRSA wound infection and worsening MRI findings I think we should cover for osteomyelitis/infection as findings worsened off of antibiotics.   ? ? ?Recommendations:  ?-D/C cefepime and metronidazole ?-Start ceftriaxone ?-Continue vancomycin ? ? ?Microbiology:   ?Antibiotics: ?Vancomycin  4/16-p ?Metronidazole 4/16-p ?Cefepime 4/16-p ?Cultures: ?Blood ?4/15 NG ? ?HPI: Tammy Good is a 71 y.o. female with diabetes, hypertension, PAD, recent admission for MRSA bacteremia secondary to abscess and osteomyelitis status post 4 weeks of antibiotics(Vanc x 2wks then linezolidx2wks) admitted with sepsis secondary to left foot wound.  Patient had signs of inflammation of foot and started on linezolid by podiatry on 4/13.  She developed nausea and vomiting and presented to the ED.  On arrival to ED patient had a temp 100.9 and.  WBC 11 K.  MRI ankle showed new osteomyelitis of cuboid, talar head, navicular bone, progressive osteomyelitis of calcaneus, new calcaneocuboid and talonavicular septic arthritis, progressive diffuse severe soft tissue swelling of the ankle.  ABIs showed moderate left lower extremity arterial disease.  Vascular surgery consulted and plan on aortogram with left leg runoff on 4/20.  Orthopedics consulted and awaiting vascular intervention prior to any additional intervention. ? ?Review of Systems: ?Review of Systems  ?All other systems reviewed and are negative. ? ?Past Medical History:  ?Diagnosis Date  ? CAD S/P percutaneous coronary angioplasty 08/2013  ? 100% mRCA - PCI Integrity Resolute DES 3.0 mm x 38 mm - 3.35 mm; PTCA of RPA V 2.0 mm x 15 mm  ? Cholesteatoma   ? right  ? Diabetes mellitus type 2 in obese [facility])   ? On insulin and Invokana  ? History of osteomyelitis L 5th Toe all 05/2019  ? s/p Partial Ray Amputation with partial closure; 6 wks Abx & LSFA Atherectomy/DEB PTA with Stent for focal dissection.  ? Hyperlipidemia with target LDL less than 70   ? Hypothyroidism (acquired)   ? Mild essential hypertension   ? Obesity (  BMI 30-39.9) 11/17/2013  ? PAD (peripheral artery disease) ([place]) 05/26/2019  ? 05/26/19: Abd AoGram- BLE runoff -> L SFA orbital atherectomy - PTA w/ DEB & Stent 6 x 40 Luttonix (for focal dissection) - patent Pop A with 3 V runoff. LEA Dopplers 01/03/2020:  RABI (prev) 0.68 (0.53)/ RTBI (prev) 0.58 (0.33); LABI (prev) 0.80 (0.64), LTBI (prev) 0.64 (0.51); R mSFA ~50-74%, L mSFA 50-74%. Patent Prox SFA stent < 49% stenosis  ? Panhypopituitarism ([place])   ? ST elevation myocardial infarction (STEMI) of inferior wall, subsequent episode of care [facility]) 08/2013  ? 80% branch of D1, 40% mid AV groove circumflex, 100% RCA with subacute thrombus -- thrombus extending into RPA V with 100% occlusion after initial angioplasty of mid RCA ;; Post MI ECHO 6/9/'15: EF 50-55%, mild LVH with moderate HK of inferior wall, Gr1 DD, mild LA dilation; mildly reduced RV function  ? ? ?Social History  ? ?Tobacco Use  ? Smoking status: Former  ? Smokeless tobacco: Never  ?Substance Use Topics  ? Alcohol use: No  ? Drug use: No  ? ? ?Family History  ?Problem Relation Age of Onset  ? Cancer Mother 22  ?     multiple myeloma  ? Heart attack Father 26  ? Cancer Sister   ? Alzheimer's disease Maternal Grandmother   ? ?Scheduled Meds: ? atorvastatin  40 mg Oral q1800  ? collagenase  1 application. Topical Daily  ? enoxaparin (LOVENOX) injection  1 mg/kg Subcutaneous BID  ? hydrocortisone  10 mg Oral q1800  ? hydrocortisone  20 mg Oral Daily  ? insulin aspart  0-20 Units Subcutaneous TID WC  ? insulin glargine-yfgn  40 Units Subcutaneous QHS  ? levothyroxine  125 mcg Oral QAC breakfast  ? midodrine  5 mg Oral BID WC  ? pantoprazole  40 mg Oral BID AC  ? ?Continuous Infusions: ? ceFEPime (MAXIPIME) IV 2 g (07/09/21 1132)  ? vancomycin 1,250 mg (07/09/21 0857)  ? ?PRN Meds:.acetaminophen **OR** acetaminophen ?Allergies  ?Allergen Reactions  ? Strawberry Extract Itching, Swelling and Anaphylaxis  ?  Mouth swells and gets itchy  ? ? ?OBJECTIVE: ?Blood pressure 124/72, pulse 78, temperature 97.9 ?F (36.6 ?C), temperature source Oral, resp. rate 18, height '5\' 7"'  (1.702 m), weight 86.9 kg, SpO2 98 %. ? ?Physical Exam ?Constitutional:   ?   Appearance: Normal appearance.  ?HENT:  ?   Head: Normocephalic and  atraumatic.  ?   Right Ear: Tympanic membrane normal.  ?   Left Ear: Tympanic membrane normal.  ?   Nose: Nose normal.  ?   Mouth/Throat:  ?   Mouth: Mucous membranes are moist.  ?Eyes:  ?   Extraocular Movements: Extraocular movements intact.  ?   Conjunctiva/sclera: Conjunctivae normal.  ?   Pupils: Pupils are equal, round, and reactive to light.  ?Cardiovascular:  ?   Rate and Rhythm: Normal rate and regular rhythm.  ?   Heart sounds: No murmur heard. ?  No friction rub. No gallop.  ?Pulmonary:  ?   Effort: Pulmonary effort is normal.  ?   Breath sounds: Normal breath sounds.  ?Abdominal:  ?   General: Abdomen is flat.  ?   Palpations: Abdomen is soft.  ?Musculoskeletal:     ?   General: Normal range of motion.  ?   Comments: Left ankle wound with some drainage.   ?Skin: ?   General: Skin is warm and dry.  ?Neurological:  ?   General:  No focal deficit present.  ?   Mental Status: She is alert and oriented to person, place, and time.  ?Psychiatric:     ?   Mood and Affect: Mood normal.  ? ? ?Lab Results ?Lab Results  ?Component Value Date  ? WBC 8.7 07/09/2021  ? HGB 9.0 (L) 07/09/2021  ? HCT 28.9 (L) 07/09/2021  ? MCV 86.8 07/09/2021  ? PLT 497 (H) 07/09/2021  ?  ?Lab Results  ?Component Value Date  ? CREATININE 1.03 (H) 07/09/2021  ? BUN 12 07/09/2021  ? NA 135 07/09/2021  ? K 3.7 07/09/2021  ? CL 101 07/09/2021  ? CO2 23 07/09/2021  ?  ?Lab Results  ?Component Value Date  ? ALT 11 07/07/2021  ? AST 17 07/07/2021  ? ALKPHOS 97 07/07/2021  ? BILITOT 0.9 07/07/2021  ?  ? ? ? ?Laurice Record, MD ?[facility] for Infectious Disease ?[facility] ?07/09/2021, 2:17 PM  ?

## 2021-07-10 ENCOUNTER — Telehealth: Payer: Self-pay | Admitting: Podiatry

## 2021-07-10 DIAGNOSIS — M86172 Other acute osteomyelitis, left ankle and foot: Secondary | ICD-10-CM | POA: Diagnosis not present

## 2021-07-10 LAB — BASIC METABOLIC PANEL
Anion gap: 6 (ref 5–15)
BUN: 8 mg/dL (ref 8–23)
CO2: 23 mmol/L (ref 22–32)
Calcium: 7.9 mg/dL — ABNORMAL LOW (ref 8.9–10.3)
Chloride: 108 mmol/L (ref 98–111)
Creatinine, Ser: 0.82 mg/dL (ref 0.44–1.00)
GFR, Estimated: >60 mL/min (ref >60)
Glucose, Bld: 156 mg/dL — ABNORMAL HIGH (ref 70–99)
Potassium: 3.9 mmol/L (ref 3.5–5.1)
Sodium: 137 mmol/L (ref 135–145)

## 2021-07-10 LAB — CBC
HCT: 28.8 % — ABNORMAL LOW (ref 36.0–46.0)
Hemoglobin: 8.9 g/dL — ABNORMAL LOW (ref 12.0–15.0)
MCH: 26.7 pg (ref 26.0–34.0)
MCHC: 30.9 g/dL (ref 30.0–36.0)
MCV: 86.5 fL (ref 80.0–100.0)
Platelets: 453 10*3/uL — ABNORMAL HIGH (ref 150–400)
RBC: 3.33 MIL/uL — ABNORMAL LOW (ref 3.87–5.11)
RDW: 18.6 % — ABNORMAL HIGH (ref 11.5–15.5)
WBC: 5.4 10*3/uL (ref 4.0–10.5)
nRBC: 0 % (ref 0.0–0.2)

## 2021-07-10 LAB — GLUCOSE, CAPILLARY
Glucose-Capillary: 82 mg/dL (ref 70–99)
Glucose-Capillary: 119 mg/dL — ABNORMAL HIGH (ref 70–99)
Glucose-Capillary: 186 mg/dL — ABNORMAL HIGH (ref 70–99)
Glucose-Capillary: 157 mg/dL — ABNORMAL HIGH (ref 70–99)

## 2021-07-10 LAB — VANCOMYCIN, PEAK: Vancomycin Pk: 41 ug/mL — ABNORMAL HIGH (ref 30–40)

## 2021-07-10 MED ORDER — INSULIN GLARGINE-YFGN 100 UNIT/ML ~~LOC~~ SOLN
35.0000 [IU] | Freq: Every day | SUBCUTANEOUS | Status: DC
  Administered 2021-07-10: 35 [IU] via SUBCUTANEOUS
  Filled 2021-07-10 (×3): qty 0.35

## 2021-07-10 MED ORDER — SODIUM CHLORIDE 0.9 % IV SOLN
2.0000 g | INTRAVENOUS | Status: DC
  Administered 2021-07-10 – 2021-07-17 (×8): 2 g via INTRAVENOUS
  Filled 2021-07-10 (×9): qty 20

## 2021-07-10 MED ORDER — SODIUM CHLORIDE 0.9 % IV SOLN: 2.0000 g | Freq: Three times a day (TID) | INTRAVENOUS | Status: DC

## 2021-07-10 NOTE — Progress Notes (Signed)
[facility] ? ?Subjective  - Comfortable without new complaints ? ? ?Objective ?(!) 142/87 ?91 ?98.2 ?F (36.8 ?C) (Oral) ?18 ?98% ? ?Intake/Output Summary (Last 24 hours) at 07/10/2021 0824 ?Last data filed at 07/10/2021 0600 ?Gross per 24 hour  ?Intake 1414 ml  ?Output 1875 ml  ?Net -461 ml  ? ? ?Left ankle wound with tissue loss ?General no acute distress ? ?Assessment/Planning: ?71 y.o. female is s/p L lateral ankle wound ? ?NPO past MN patient agrees to proceed with angiogram and possible intervention left LE. ? ?Roxy Horseman ?07/10/2021 ?8:24 AM ?-- ? ?Laboratory ?Lab Results: ?Recent Labs  ?  07/09/21 ?7262 07/10/21 ?0315  ?WBC 8.7 5.4  ?HGB 9.0* 8.9*  ?HCT 28.9* 28.8*  ?PLT 497* 453*  ? ?BMET ?Recent Labs  ?  07/09/21 ?0241 07/10/21 ?0315  ?NA 135 137  ?K 3.7 3.9  ?CL 101 108  ?CO2 23 23  ?GLUCOSE 271* 156*  ?BUN 12 8  ?CREATININE 1.03* 0.82  ?CALCIUM 8.0* 7.9*  ? ? ?COAG ?Lab Results  ?Component Value Date  ? INR 1.7 (H) 07/08/2021  ? INR 2.6 (H) 07/07/2021  ? INR 1.1 05/26/2019  ? ?No results found for: PTT ? ? ? ?

## 2021-07-10 NOTE — Progress Notes (Signed)
?   ? ? ? ? ?[facility] for Infectious Disease ? ?Date of Admission:  07/07/2021   Total days of inpatient antibiotics 3 ? ?Principal Problem: ?  Osteomyelitis of left foot ([place]) ?Active Problems: ?  Panhypopituitarism ([place]) ?  Diabetes mellitus, type 2 ([place]) ?  Essential hypertension ?  PAD (peripheral artery disease) ([place]) ?     ?    ?Assessment: ?71 year old female with history of MRSA bacteremia secondary to left foot infection/osteomyelitis admitted for worsening left foot infection.  She was placed on linezolid 4/13 with podiatry due to worsening wound. ?  ?  ?#New osteomyelitis of cuboid, talar head, navicular bone, #Progressive osteomyelitis of calcaneus ?#Calcaneocuboid and talonavicular septic arthritis ?#Vertigo associated with Linezolid ?-ABIs showed moderate left lower extremity arterial disease.   ?-Vascular surgery consulted and plan on aortogram with left leg runoff on 4/20.   ?-Orthopedics consulted and awaiting vascular intervention prior to any additional intervention. ?  ?#Hx of MRSA bacteremia likely 2/2 foot abscess/osteomyelitis ?-Admitted 2/1-2/4 with MRSA bacteremia, MRI showed increased marrow edema distal fibula concerning for osteomyelitis. Underwent debridement with podiatry and bone Bx with wounds Cx of abscess+ MRSA. Bone bx path showed fibrosis + slight inflammation without necrotic bone.  Pt was treated with 2 weeks of IV vanc->2 week of PO linezolid. Seen in ID clinic on 3/3, noted to have severe vertigo may have been associated with linezolid. Plan was to monitor pt off antibiotics.  ?  ?Management: ?-Given pt presented with fever, leukocytosis with Hx of recent MRSA wound infection and worsening MRI findings I think we should cover for osteomyelitis/infection as findings worsened off of antibiotics.   ?  ?  ?Recommendations:  ?-Continue vancomycin and ceftriaxone ?-Angiogram with possible intervention tomorrow ?  ?Microbiology:   ?Antibiotics: ?Vancomycin  4/16-p ?Metronidazole 4/16-p ?Cefepime 4/16-p ?Cultures: ?Blood ?4/15 NG ?  ? ?SUBJECTIVE: ?Resting in bed. No new complaints ?Interval: afebrile overnight ? ?Review of Systems: ?Review of Systems  ?All other systems reviewed and are negative. ? ? ?Scheduled Meds: ? atorvastatin  40 mg Oral q1800  ? Chlorhexidine Gluconate Cloth  6 each Topical Q0600  ? collagenase  1 application. Topical Daily  ? enoxaparin (LOVENOX) injection  40 mg Subcutaneous Q24H  ? hydrocortisone  10 mg Oral Daily  ? hydrocortisone  5 mg Oral Daily  ? insulin aspart  0-20 Units Subcutaneous TID WC  ? insulin glargine-yfgn  35 Units Subcutaneous QHS  ? levothyroxine  125 mcg Oral QAC breakfast  ? midodrine  5 mg Oral BID WC  ? mupirocin ointment  1 application. Nasal BID  ? pantoprazole  40 mg Oral BID AC  ? ?Continuous Infusions: ? cefTRIAXone (ROCEPHIN)  IV 2 g (07/10/21 3557)  ? vancomycin 1,250 mg (07/10/21 1006)  ? ?PRN Meds:.acetaminophen **OR** acetaminophen ?Allergies  ?Allergen Reactions  ? Strawberry Extract Itching, Swelling and Anaphylaxis  ?  Mouth swells and gets itchy  ? ? ?OBJECTIVE: ?Vitals:  ? 07/09/21 0901 07/09/21 2111 07/10/21 0459 07/10/21 0913  ?BP: 124/72 138/86 (!) 142/87 133/67  ?Pulse: 78 90 91 75  ?Resp: '18 16 18 18  '$ ?Temp: 97.9 ?F (36.6 ?C) 98.2 ?F (36.8 ?C) 98.2 ?F (36.8 ?C) 98.1 ?F (36.7 ?C)  ?TempSrc: Oral  Oral Oral  ?SpO2: 98% 98% 98% 95%  ?Weight:      ?Height:      ? ?Body mass index is 30.01 kg/m?. ? ?Physical Exam ?Constitutional:   ?   Appearance: Normal appearance.  ?HENT:  ?  Head: Normocephalic and atraumatic.  ?   Right Ear: Tympanic membrane normal.  ?   Left Ear: Tympanic membrane normal.  ?   Nose: Nose normal.  ?   Mouth/Throat:  ?   Mouth: Mucous membranes are moist.  ?Eyes:  ?   Extraocular Movements: Extraocular movements intact.  ?   Conjunctiva/sclera: Conjunctivae normal.  ?   Pupils: Pupils are equal, round, and reactive to light.  ?Cardiovascular:  ?   Rate and Rhythm: Normal rate and  regular rhythm.  ?   Heart sounds: No murmur heard. ?  No friction rub. No gallop.  ?Pulmonary:  ?   Effort: Pulmonary effort is normal.  ?   Breath sounds: Normal breath sounds.  ?Abdominal:  ?   General: Abdomen is flat.  ?   Palpations: Abdomen is soft.  ?Musculoskeletal:  ?   Comments: Left foot wound bandaged  ?Skin: ?   General: Skin is warm and dry.  ?Neurological:  ?   General: No focal deficit present.  ?   Mental Status: She is alert and oriented to person, place, and time.  ?Psychiatric:     ?   Mood and Affect: Mood normal.  ? ? ? ? ?Lab Results ?Lab Results  ?Component Value Date  ? WBC 5.4 07/10/2021  ? HGB 8.9 (L) 07/10/2021  ? HCT 28.8 (L) 07/10/2021  ? MCV 86.5 07/10/2021  ? PLT 453 (H) 07/10/2021  ?  ?Lab Results  ?Component Value Date  ? CREATININE 0.82 07/10/2021  ? BUN 8 07/10/2021  ? NA 137 07/10/2021  ? K 3.9 07/10/2021  ? CL 108 07/10/2021  ? CO2 23 07/10/2021  ?  ?Lab Results  ?Component Value Date  ? ALT 11 07/07/2021  ? AST 17 07/07/2021  ? ALKPHOS 97 07/07/2021  ? BILITOT 0.9 07/07/2021  ?  ? ? ? ? ?Laurice Record, MD ?[facility] for Infectious Disease ?[facility] ?07/10/2021, 11:48 AM  ?

## 2021-07-10 NOTE — Progress Notes (Signed)
Subjective: ?71 year old female with past medical history significant for MRSA bacteremia secondary to left foot wound and osteomyelitis was admitted over the weekend for worsening symptoms.  I had seen her in the office last week and the wound was stable but ordered blood work to make sure.  It was elevated that time she was started on Zyvox.  Over the weekend she states her symptoms systemically progress and she feels this is more due to other GI issues.  Today she states that she feels much better and she is eating and she has no fevers or chills or significant pain. ? ?Scheduled for angio tomorrow with vascular surgery ? ?Objective: ?AAO x3, NAD ?Wound is stable to the left ankle.  Mild edema present but there is no significant erythema or warmth. ?No pain with calf compression, swelling, warmth, erythema ? ?Assessment: ?Concern for worsening infection left side ? ?Plan: ?I had a long discussion today with the patient (30 minutes) regarding treatment options. We reviewed her MRI and arterial studies.  She is scheduled for angio tomorrow.  Ultimately discussed first and foremost we need to make sure the circulation is adequate.  After that we discussed different options including trying to salvage the limb versus below-knee amputation.  The patient states that she is not ready for amputation and she is asking for any salvage options.  Although the MRI does show worsening infection clinically the foot does not like this.  She recently underwent surgery and biopsy and the incision has completely healed along the areas that show concern for infection.  Discussed with her we will see how the procedure goes tomorrow with vascular and then make further decisions in regards to limb salvage versus amputation. ? ?Celesta Gentile, DPM ?

## 2021-07-10 NOTE — TOC Progression Note (Deleted)
Transition of Care (TOC) - Progression Note  ? ? ?Patient Details  ?Name: Tammy Good ?MRN: [id number] ?Date of Birth: December 01, 1950 ? ?Transition of Care (TOC) CM/SW Contact  ?Tom-Johnson, Renea Ee, RN ?Phone Number: ?07/10/2021, 12:20 PM ? ?Clinical Narrative:    ? ?. ?  ?  ? ?Expected Discharge Plan and Services ?  ?  ?  ?  ?  ?                ?  ?  ?  ?  ?  ?  ?  ?  ?  ?  ? ? ?Social Determinants of Health (SDOH) Interventions ?  ? ?Readmission Risk Interventions ? ?  06/03/2021  ? 12:08 PM 05/30/2019  ?  1:15 PM 05/27/2019  ?  5:19 PM  ?Readmission Risk Prevention Plan  ?Transportation Screening Complete Complete Complete  ?PCP or Specialist Appt within 5-7 Days  Complete Complete  ?Home Care Screening  Complete Complete  ?Medication Review (RN CM)  Complete Complete  ?Medication Review Press photographer) Complete    ?PCP or Specialist appointment within 3-5 days of discharge Complete    ?[place] or Home Care Consult Complete    ?SW Recovery Care/Counseling Consult Complete    ?Palliative Care Screening Not Applicable    ?[place] Not Applicable    ? ? ?

## 2021-07-10 NOTE — Telephone Encounter (Signed)
Pt called again today she was checking to see if Dr [name] was contacted about her being in the hospital. She is in room 42m0 per pt. She would like to see Dr WJacqualyn Poseybefore they proceed with possible amputation of foot since Dr WJacqualyn Poseyis the one that did surgery on her foot to begin with.  ?I told pt to have the doctor that is treating her to consult Dr WJacqualyn Posey?

## 2021-07-10 NOTE — Progress Notes (Signed)
? ?HD#3 ?SUBJECTIVE:  ?Patient Summary: Tammy Good is a 71 y.o. 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  ? ?Overnight Events: none ? ?Interim History: Patient assessed at bedside this AM.  We talked about having procedure done tomorrow needing to be n.p.o. in the morning.  She states that she feels great today.  No pain in her foot.  No other concerns at this time. ? ?OBJECTIVE:  ?Vital Signs: ?Vitals:  ? 07/09/21 0613 07/09/21 0901 07/09/21 2111 07/10/21 0459  ?BP: 122/85 124/72 138/86 (!) 142/87  ?Pulse: 81 78 90 91  ?Resp: '18 18 16 18  '$ ?Temp: 98.1 ?F (36.7 ?C) 97.9 ?F (36.6 ?C) 98.2 ?F (36.8 ?C) 98.2 ?F (36.8 ?C)  ?TempSrc:  Oral  Oral  ?SpO2: 100% 98% 98% 98%  ?Weight:      ?Height:      ? ?Supplemental O2: Room Air ?SpO2: 98 % ?O2 Flow Rate (L/min): 2 L/min ? ?Filed Weights  ? 07/07/21 0630 07/07/21 0804 07/07/21 2107  ?Weight: 101 kg 86.2 kg 86.9 kg  ? ? ? ?Intake/Output Summary (Last 24 hours) at 07/10/2021 0743 ?Last data filed at 07/10/2021 0600 ?Gross per 24 hour  ?Intake 1634 ml  ?Output 1875 ml  ?Net -241 ml  ? ? ?Net IO Since Admission: 103 mL [07/10/21 0743] ? ?Physical Exam: ?Physical Exam ?HENT:  ?   Head: Normocephalic.  ?   Mouth/Throat:  ?   Mouth: Mucous membranes are moist.  ?Cardiovascular:  ?   Rate and Rhythm: Normal rate and regular rhythm.  ?   Pulses: Normal pulses.  ?   Heart sounds: Normal heart sounds.  ?Pulmonary:  ?   Effort: Pulmonary effort is normal.  ?   Breath sounds: Normal breath sounds.  ?Abdominal:  ?   General: Abdomen is flat. Bowel sounds are normal. There is no distension.  ?   Palpations: Abdomen is soft.  ?   Tenderness: There is no abdominal tenderness.  ?   Comments: Umbilical hernia present, that is reducible  ?Skin: ?   General: Skin is warm and dry.  ?   Comments:  Wound on left side of lower leg with granulation tissue, no purulence of erythema  ?Neurological:  ?   General: No focal deficit present.  ?   Mental Status: She is alert and oriented to person, place, and time.  ?  ? ?Patient Lines/Drains/Airways Status   ? ? Active Line/Drains/Airways   ? ? Name Placement date Placement time Site Days  ? Peripheral IV 07/07/21 18 G Right Antecubital 07/07/21  0328  Antecubital  1  ? External Urinary Catheter 06/05/21  0000  --  33  ? External Urinary Catheter 07/07/21  2107  --  1  ? Incision (Closed) 05/25/19 Foot 05/25/19  1303  -- 775  ? Incision (Closed) 04/24/21 Ankle Left 04/24/21  2259  -- 75  ? Pressure Injury 05/31/21 Sacrum Mid;Lower Stage 2 -  Partial thickness loss of dermis presenting as a shallow open injury with a red, pink wound bed without slough. denuded area between buttock on sacrum-pink and peeling 05/31/21  0300  -- 38  ? Wound / Incision (Open or Dehisced) 04/23/21 Other (Comment) Ankle Left full thickness wound 04/23/21  --  Ankle  76  ? Wound / Incision (Open or Dehisced) 04/23/21 Other (Comment) Foot  Left full thickness wound 04/23/21  --  Foot  76  ? Wound / Incision (Open or Dehisced) 04/26/21 Skin tear Thigh Left;Posterior Pinched while on [facility] 04/26/21  2306  Thigh  73  ? ?  ?  ? ?  ? ? ?Pertinent Labs: ? ?  Latest Ref Rng & Units 07/10/2021  ?  3:15 AM 07/09/2021  ?  2:41 AM 07/08/2021  ?  5:00 AM  ?CBC  ?WBC 4.0 - 10.5 K/uL 5.4   8.7   7.3    ?Hemoglobin 12.0 - 15.0 g/dL 8.9   9.0   8.6    ?Hematocrit 36.0 - 46.0 % 28.8   28.9   26.9    ?Platelets 150 - 400 K/uL 453   497   480    ? ? ? ?  Latest Ref Rng & Units 07/10/2021  ?  3:15 AM 07/09/2021  ?  2:41 AM 07/08/2021  ?  5:00 AM  ?CMP  ?Glucose 70 - 99 mg/dL 156   271   306    ?BUN 8 - 23 mg/dL '8   12   11    '$ ?Creatinine 0.44 - 1.00 mg/dL 0.82   1.03   0.99    ?Sodium 135 - 145 mmol/L 137   135   137    ?Potassium 3.5 - 5.1 mmol/L 3.9   3.7   3.5    ?Chloride 98 - 111 mmol/L 108   101   107    ?CO2 22 -  32 mmol/L '23   23   21    '$ ?Calcium 8.9 - 10.3 mg/dL 7.9   8.0   8.2    ? ? ?Recent Labs  ?  07/09/21 ?1627 07/09/21 ?2112 07/10/21 ?0719  ?GLUCAP 165* 212* 82  ? ?  ? ?ASSESSMENT/PLAN:  ?Assessment: ? ?Principal Problem: ?  Osteomyelitis of left foot ([place]) ?Active Problems: ?  Panhypopituitarism ([place]) ?  Diabetes mellitus, type 2 ([place]) ?  Essential hypertension ?  PAD (peripheral artery disease) ([place]) ? ? ? ?Tammy Good is a 71 y.o. 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  ? 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 ?Patient as remained afebrile with no leukocytosis. Blood cultures with no growth to date.  Her antibiotic regimen has been adjusted to vancomycin and ceftriaxone, appreciate ID recommendations. [name] is following case. Thinking that increasing blood flow to foot will help with antibiotic circulation and improve infection of foot.  Vascular surgery in a.m. for arteriogram on Thursday, 4/20. ? ?-Vancomycin, day 4 ?-Ceftriaxone, day 4 ?-appreciate ID recommendations ?-Orthopedic surgery following ?-Vascular surgery planning for arteriogram 4/20 ?-NPO after midnight ?-Blood cultures pending, ngtd 24 hours ? ?#AKI ?Creatinine initially elevated, but improved to 0.82. ? ?-Trend BMP ? ?#Acquired panhypopituitarism ?Home medications include levothyroxine 125 mcg and dexamethasone 0.5 qd. Will continue dosage for now as patient has procedure tomorrow. ? ?-Hydrocortisone 20 mg in the morning and 10 mg at night, day 3 ?-Synthroid 125 mcg ? ?#Insulin- dependent diabetes ?Medications include glargine 42 units and NovoLog sliding scale, Farxiga.  She is on chronic steroids and  receiving stress dose steroids here. ? ?-Glargine 35 units nightly ?-SSI, resistant ? ?#Thrombocytosis ?Platelets was elevated at  610 on admission. Likely reactive in setting of chronic infection. ? ?-Trend CBC ? ?#Normocytic anemia ?#History of Non-bleeding esophageal ulcer ?Hemoglobin at 8.6 this morning, down from 10.6.  History of prior hospitalization due to GI bleed several months ago.  No signs of hemoptysis or melena.  Thinking this was likely hemodilution in the setting of hypovolemia with her recent vomiting episodes.  Medications include pantoprazole 40 mg twice daily ? ?-Trend CBC ? ?#Paroxysmal A-fib ?Medications include Eliquis.  This is being held in the setting of possible surgery. ? ?-ok to hold until after surgery ? ?Best Practice: ?Diet: carb modified ?IVF: Fluids: none ?VTE: enoxaparin (LOVENOX) injection 40 mg Start: 07/10/21 1000  ?Code: Full ?AB: Vancomycin, cefazolin ?Therapy Recs: Home Health, her daughter states that she already has a rolling walker at home ?Family Contact: Unable to reach daughter via phone today ?DISPO: Anticipated discharge in 2-3 days to Home pending possible revascularization or orthopedic surgery. ? ?Signature: ?Daleen Bo. Jameeka Marcy, D.O.  ?Internal Medicine Resident, PGY-1 ?[facility]  ?Pager: [phone] ?7:43 AM, 07/10/2021  ? ?Please contact the on call pager after 5 pm and on weekends at [phone].  ?

## 2021-07-10 NOTE — Progress Notes (Signed)
Physical Therapy Treatment ?Patient Details ?Name: Tammy Good ?MRN: [id number] ?DOB: Jun 11, 1950 ?Today's Date: 07/10/2021 ? ? 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 Pt with suspected recurrent SFA disease. Plan for anteriogram with possible L LE intervention 4/20. 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 ? ?  ?PT Comments  ? ? Continuing work on functional mobility and activity tolerance;  Session focused on progressive amb, and pt was able to walk in hallways with RW overall well; no loss of balance noted; suboptimal posiutre with trunk flexed and head forward, neck flexed; pt states this is habit; For arteriogram tomorrow   ?Recommendations for follow up therapy are one component of a multi-disciplinary discharge planning process, led by the attending physician.  Recommendations may be updated based on patient status, additional functional criteria and insurance authorization. ? ?Follow Up Recommendations ? Home health PT ?  ?  ?Assistance Recommended at Discharge Set up Supervision/Assistance  ?Patient can return home with the following Assist for transportation;Help with stairs or ramp for entrance;Assistance with cooking/housework ?  ?Equipment Recommendations ? Rolling walker (2 wheels)  ?  ?Recommendations for Other Services OT consult ? ? ?  ?Precautions / Restrictions Precautions ?Precautions: Fall  ?  ? ?Mobility ? Bed Mobility ?  ?  ?  ?  ?  ?  ?  ?  ?  ? ?Transfers ?Overall transfer level: Needs assistance ?Equipment used: None ?Transfers: Sit to/from Stand ?  ?  ?  ?  ?  ?  ?General transfer comment: Good rise from reclliner with use of armrests to push off; decr control of descent to sit after walk, better when repeated with cues for handplacement ?  ? ?Ambulation/Gait ?Ambulation/Gait assistance: Min guard ?Gait Distance (Feet): 85 Feet ?Assistive device: Rolling walker (2 wheels) ?Gait  Pattern/deviations: Step-through pattern, Trunk flexed ?Gait velocity: slowed ?  ?  ?General Gait Details: Cues to self-monitor for activity tolerance; head forward and neck flexed; pt reports this is habit and she must look down to see where she is stepping ? ? ?Stairs ?  ?  ?  ?  ?  ? ? ?Wheelchair Mobility ?  ? ?Modified Rankin (Stroke Patients Only) ?  ? ? ?  ?Balance   ?  ?Sitting balance-Leahy Scale: Good ?  ?  ?  ?Standing balance-Leahy Scale: Fair ?  ?  ?  ?  ?  ?  ?  ?  ?  ?  ?  ?  ?  ? ?  ?Cognition Arousal/Alertness: Awake/alert ?Behavior During Therapy: [facility] for tasks assessed/performed ?Overall Cognitive Status: Within Functional Limits for tasks assessed ?  ?  ?  ?  ?  ?  ?  ?  ?  ?  ?  ?  ?  ?  ?  ?  ?  ?  ?  ? ?  ?Exercises   ? ?  ?General Comments   ?  ?  ? ?Pertinent Vitals/Pain Pain Assessment ?Pain Assessment: No/denies pain  ? ? ?Home Living   ?  ?  ?  ?  ?  ?  ?  ?  ?  ?   ?  ?Prior Function    ?  ?  ?   ? ?PT Goals (current goals can now be found in the care plan section) Acute Rehab PT Goals ?Patient Stated Goal: wants to walk ?PT Goal Formulation:  With patient ?Time For Goal Achievement: 07/23/21 ?Potential to Achieve Goals: Good ?Progress towards PT goals: Progressing toward goals ? ?  ?Frequency ? ? ? Min 3X/week ? ? ? ?  ?PT Plan Current plan remains appropriate  ? ? ?Co-evaluation   ?  ?  ?  ?  ? ?  ?AM-PAC PT "6 Clicks" Mobility   ?Outcome Measure ? Help needed turning from your back to your side while in a flat bed without using bedrails?: None ?Help needed moving from lying on your back to sitting on the side of a flat bed without using bedrails?: None ?Help needed moving to and from a bed to a chair (including a wheelchair)?: None ?Help needed standing up from a chair using your arms (e.g., wheelchair or bedside chair)?: A Little ?Help needed to walk in hospital room?: A Little ?Help needed climbing 3-5 steps with a railing? : A Little ?6 Click Score: 21 ? ?  ?End of Session    ?Activity Tolerance: Patient tolerated treatment well (thoguh tired from lots of OOB activity in am) ?Patient left: in chair;with call bell/phone within reach ?Nurse Communication: Mobility status ?PT Visit Diagnosis: Unsteadiness on feet (R26.81);Other abnormalities of gait and mobility (R26.89) ?  ? ? ?Time: 1234-1300 ?PT Time Calculation (min) (ACUTE ONLY): 26 min ? ?Charges:  $Gait Training: 8-22 mins ?$Therapeutic Activity: 8-22 mins          ?          ? ?Roney Marion, PT  ?Acute Rehabilitation Services ?Office [phone] ? ? ? ?[name] ?07/10/2021, 2:33 PM ? ?

## 2021-07-10 NOTE — Care Management Important Message (Signed)
Important Message ? ?Patient Details  ?Name: Tammy Good ?MRN: [id number] ?Date of Birth: 1950/05/26 ? ? ?Medicare Important Message Given:  Yes ? ? ? ? ?Cleda Imel ?07/10/2021, 3:32 PM ?

## 2021-07-10 NOTE — Progress Notes (Signed)
Pharmacy Antibiotic Note ? ?Tammy Good is a 71 y.o. female admitted on 07/07/2021 with sepsis 2/2 cellulitis. 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 Pharmacy has been consulted for Vancomycin/cefepime dosing. ? ?Patient day 4 of vancomycin. Will plan for vanc peak and trough after today's dose. Patient with VVS procedure planned on 4/20 and possible ortho procedure after that per [name] ? ?Scr 0.82  ?Blood and urine cultures NGTD ? ? ?Plan: ?Continue Vancomycin '1250mg'$  q24hr ?Plan to obtain levels after dose today ?Increase Cefepime 2gm q8hr ?Will monitor for acute changes in renal function and adjust as needed ?F/u cultures results and de-escalate as appropriate ? ?Height: '5\' 7"'$  (170.2 cm) ?Weight: 86.9 kg (191 lb 9.3 oz) ?IBW/kg (Calculated) : 61.6 ? ?Temp (24hrs), Avg:98.1 ?F (36.7 ?C), Min:97.9 ?F (36.6 ?C), Max:98.2 ?F (36.8 ?C) ? ?Recent Labs  ?Lab 07/03/21 ?1250 07/07/21 ?0325 07/07/21 ?0630 07/08/21 ?0500 07/09/21 ?0241 07/10/21 ?0315  ?WBC 12.4* 11.5*  --  7.3 8.7 5.4  ?CREATININE 1.21* 1.33*  --  0.99 1.03* 0.82  ?LATICACIDVEN  --  3.1* 1.5  --   --   --   ? ?  ?Estimated Creatinine Clearance: 73.3 mL/min (by C-G formula based on SCr of 0.82 mg/dL).   ? ?Allergies  ?Allergen Reactions  ? Strawberry Extract Itching, Swelling and Anaphylaxis  ?  Mouth swells and gets itchy  ? ? ?[name] A. Levada Dy, PharmD, BCPS, FNKF ?Clinical Pharmacist ?[place] ?Please utilize Amion for appropriate phone number to reach the unit pharmacist ([place]) ? ? ?

## 2021-07-10 NOTE — TOC Progression Note (Signed)
Transition of Care (TOC) - Progression Note  ? ? ?Patient Details  ?Name: Tammy Good ?MRN: [id number] ?Date of Birth: December 21, 1950 ? ?Transition of Care (TOC) CM/SW Contact  ?Tom-Johnson, Renea Ee, RN ?Phone Number: ?07/10/2021, 12:31 PM ? ?Clinical Narrative:    ? ?Patient continue on IV abt. Tentative Angiogram with possible intervention tomorrow 07/11/21. ?Home health PT recommended. Patient states she is currently active with [facility] services. CM called and spoke with Ramond Marrow and voiced acceptance for resumption of care. RW recommended, patient already has one at home. ?CM will continue to follow with needs.  ? ? ?Expected Discharge Plan: [place] ?Barriers to Discharge: Continued Medical Work up ? ?Expected Discharge Plan and Services ?Expected Discharge Plan: [place] ?  ?Discharge Planning Services: CM Consult ?Post Acute Care Choice: Home Health ?Living arrangements for the past 2 months: [place] ?                ?DME Arranged:  (RW recommended,patient has one at home.) ?  ?  ?  ?  ?HH Arranged: RN, PT, OT, Nurse's Aide ?[place] Agency: [place] ([place]) ?Date HH Agency Contacted: 07/10/21 ?Time [place]: 1856 ?Representative spoke with at [place]: Ramond Marrow ? ? ?Social Determinants of Health (SDOH) Interventions ?  ? ?Readmission Risk Interventions ? ?  06/03/2021  ? 12:08 PM 05/30/2019  ?  1:15 PM 05/27/2019  ?  5:19 PM  ?Readmission Risk Prevention Plan  ?Transportation Screening Complete Complete Complete  ?PCP or Specialist Appt within 5-7 Days  Complete Complete  ?Home Care Screening  Complete Complete  ?Medication Review (RN CM)  Complete Complete  ?Medication Review Press photographer) Complete    ?PCP or Specialist appointment within 3-5 days of discharge Complete    ?[place] or Home Care Consult Complete    ?SW Recovery Care/Counseling Consult Complete    ?Palliative Care Screening Not Applicable    ?[place] Not  Applicable    ? ? ?

## 2021-07-10 NOTE — H&P (View-Only) (Signed)
Subjective: ?71 year old female with past medical history significant for MRSA bacteremia secondary to left foot wound and osteomyelitis was admitted over the weekend for worsening symptoms.  I had seen her in the office last week and the wound was stable but ordered blood work to make sure.  It was elevated that time she was started on Zyvox.  Over the weekend she states her symptoms systemically progress and she feels this is more due to other GI issues.  Today she states that she feels much better and she is eating and she has no fevers or chills or significant pain. ? ?Scheduled for angio tomorrow with vascular surgery ? ?Objective: ?AAO x3, NAD ?Wound is stable to the left ankle.  Mild edema present but there is no significant erythema or warmth. ?No pain with calf compression, swelling, warmth, erythema ? ?Assessment: ?Concern for worsening infection left side ? ?Plan: ?I had a long discussion today with the patient (30 minutes) regarding treatment options. We reviewed her MRI and arterial studies.  She is scheduled for angio tomorrow.  Ultimately discussed first and foremost we need to make sure the circulation is adequate.  After that we discussed different options including trying to salvage the limb versus below-knee amputation.  The patient states that she is not ready for amputation and she is asking for any salvage options.  Although the MRI does show worsening infection clinically the foot does not like this.  She recently underwent surgery and biopsy and the incision has completely healed along the areas that show concern for infection.  Discussed with her we will see how the procedure goes tomorrow with vascular and then make further decisions in regards to limb salvage versus amputation. ? ?Celesta Gentile, DPM ?

## 2021-07-11 ENCOUNTER — Encounter (HOSPITAL_COMMUNITY): Payer: Self-pay | Admitting: Vascular Surgery

## 2021-07-11 ENCOUNTER — Encounter (HOSPITAL_COMMUNITY)
Admission: EM | Disposition: A | Discharge status: Home Health | Payer: Self-pay | Source: Home / Self Care | Attending: Student in an Organized Health Care Education/Training Program

## 2021-07-11 DIAGNOSIS — M86671 Other chronic osteomyelitis, right ankle and foot: Secondary | ICD-10-CM

## 2021-07-11 DIAGNOSIS — M86672 Other chronic osteomyelitis, left ankle and foot: Secondary | ICD-10-CM | POA: Diagnosis not present

## 2021-07-11 DIAGNOSIS — M86172 Other acute osteomyelitis, left ankle and foot: Secondary | ICD-10-CM | POA: Diagnosis not present

## 2021-07-11 HISTORY — PX: PERIPHERAL VASCULAR INTERVENTION: CATH118257

## 2021-07-11 HISTORY — PX: ABDOMINAL AORTOGRAM W/LOWER EXTREMITY: CATH118223

## 2021-07-11 HISTORY — PX: PERIPHERAL VASCULAR BALLOON ANGIOPLASTY: CATH118281

## 2021-07-11 LAB — GLUCOSE, CAPILLARY
Glucose-Capillary: 124 mg/dL — ABNORMAL HIGH (ref 70–99)
Glucose-Capillary: 104 mg/dL — ABNORMAL HIGH (ref 70–99)
Glucose-Capillary: 53 mg/dL — ABNORMAL LOW (ref 70–99)
Glucose-Capillary: 84 mg/dL (ref 70–99)
Glucose-Capillary: 92 mg/dL (ref 70–99)
Glucose-Capillary: 48 mg/dL — ABNORMAL LOW (ref 70–99)
Glucose-Capillary: 105 mg/dL — ABNORMAL HIGH (ref 70–99)

## 2021-07-11 LAB — BASIC METABOLIC PANEL
Anion gap: 4 — ABNORMAL LOW (ref 5–15)
BUN: 8 mg/dL (ref 8–23)
CO2: 27 mmol/L (ref 22–32)
Calcium: 8 mg/dL — ABNORMAL LOW (ref 8.9–10.3)
Chloride: 111 mmol/L (ref 98–111)
Creatinine, Ser: 0.85 mg/dL (ref 0.44–1.00)
GFR, Estimated: >60 mL/min (ref >60)
Glucose, Bld: 137 mg/dL — ABNORMAL HIGH (ref 70–99)
Potassium: 3.7 mmol/L (ref 3.5–5.1)
Sodium: 142 mmol/L (ref 135–145)

## 2021-07-11 LAB — CBC
HCT: 30.3 % — ABNORMAL LOW (ref 36.0–46.0)
Hemoglobin: 9.2 g/dL — ABNORMAL LOW (ref 12.0–15.0)
MCH: 26.4 pg (ref 26.0–34.0)
MCHC: 30.4 g/dL (ref 30.0–36.0)
MCV: 87.1 fL (ref 80.0–100.0)
Platelets: 472 10*3/uL — ABNORMAL HIGH (ref 150–400)
RBC: 3.48 MIL/uL — ABNORMAL LOW (ref 3.87–5.11)
RDW: 19.1 % — ABNORMAL HIGH (ref 11.5–15.5)
WBC: 6.2 10*3/uL (ref 4.0–10.5)
nRBC: 0 % (ref 0.0–0.2)

## 2021-07-11 LAB — VANCOMYCIN, TROUGH: Vancomycin Tr: 14 ug/mL — ABNORMAL LOW (ref 15–20)

## 2021-07-11 SURGERY — ABDOMINAL AORTOGRAM W/LOWER EXTREMITY
Anesthesia: LOCAL | Laterality: Left

## 2021-07-11 MED ORDER — HEPARIN SODIUM (PORCINE) 1000 UNIT/ML IJ SOLN
INTRAMUSCULAR | Status: AC
Start: 2021-07-11 — End: ?
  Filled 2021-07-11: qty 10

## 2021-07-11 MED ORDER — SODIUM CHLORIDE 0.9% FLUSH: 3.0000 mL | INTRAVENOUS | Status: DC | PRN

## 2021-07-11 MED ORDER — SODIUM CHLORIDE 0.9 % IV SOLN: 250.0000 mL | INTRAVENOUS | Status: DC | PRN

## 2021-07-11 MED ORDER — SODIUM CHLORIDE 0.9 % IV SOLN: INTRAVENOUS | Status: AC

## 2021-07-11 MED ORDER — APIXABAN 5 MG PO TABS
5.0000 mg | ORAL_TABLET | Freq: Two times a day (BID) | ORAL | Status: AC
  Administered 2021-07-11 – 2021-07-13 (×5): 5 mg via ORAL
  Filled 2021-07-11 (×5): qty 1

## 2021-07-11 MED ORDER — INSULIN ASPART 100 UNIT/ML IJ SOLN: 0.0000 [IU] | Freq: Three times a day (TID) | INTRAMUSCULAR | Status: DC

## 2021-07-11 MED ORDER — LIDOCAINE HCL (PF) 1 % IJ SOLN
INTRAMUSCULAR | Status: DC | PRN
  Administered 2021-07-11: 12 mL

## 2021-07-11 MED ORDER — SODIUM CHLORIDE 0.9% FLUSH
3.0000 mL | Freq: Two times a day (BID) | INTRAVENOUS | Status: DC
  Administered 2021-07-12 – 2021-07-16 (×9): 3 mL via INTRAVENOUS

## 2021-07-11 MED ORDER — LABETALOL HCL 5 MG/ML IV SOLN: 10.0000 mg | INTRAVENOUS | Status: DC | PRN

## 2021-07-11 MED ORDER — HYDRALAZINE HCL 20 MG/ML IJ SOLN: 5.0000 mg | INTRAMUSCULAR | Status: DC | PRN

## 2021-07-11 MED ORDER — ONDANSETRON HCL 4 MG/2ML IJ SOLN
4.0000 mg | Freq: Four times a day (QID) | INTRAMUSCULAR | Status: DC | PRN
  Administered 2021-07-11 – 2021-07-14 (×3): 4 mg via INTRAVENOUS
  Filled 2021-07-11 (×4): qty 2

## 2021-07-11 MED ORDER — FENTANYL CITRATE (PF) 100 MCG/2ML IJ SOLN
INTRAMUSCULAR | Status: AC
  Filled 2021-07-11: qty 2

## 2021-07-11 MED ORDER — ACETAMINOPHEN 325 MG PO TABS
650.0000 mg | ORAL_TABLET | ORAL | Status: DC | PRN
  Administered 2021-07-11 – 2021-07-12 (×2): 650 mg via ORAL
  Filled 2021-07-11 (×2): qty 2

## 2021-07-11 MED ORDER — CLOPIDOGREL BISULFATE 75 MG PO TABS: 300.0000 mg | ORAL_TABLET | Freq: Once | ORAL | Status: DC

## 2021-07-11 MED ORDER — HEPARIN (PORCINE) IN NACL 1000-0.9 UT/500ML-% IV SOLN
INTRAVENOUS | Status: AC
  Filled 2021-07-11: qty 1000

## 2021-07-11 MED ORDER — LIDOCAINE HCL (PF) 1 % IJ SOLN
INTRAMUSCULAR | Status: AC
  Filled 2021-07-11: qty 30

## 2021-07-11 MED ORDER — MIDAZOLAM HCL 2 MG/2ML IJ SOLN
INTRAMUSCULAR | Status: DC | PRN
Start: 2021-07-11 — End: 2021-07-11
  Administered 2021-07-11: 1 mg via INTRAVENOUS

## 2021-07-11 MED ORDER — CLOPIDOGREL BISULFATE 75 MG PO TABS
75.0000 mg | ORAL_TABLET | Freq: Every day | ORAL | Status: DC
  Filled 2021-07-11: qty 1

## 2021-07-11 MED ORDER — HEPARIN SODIUM (PORCINE) 1000 UNIT/ML IJ SOLN
INTRAMUSCULAR | Status: DC | PRN
  Administered 2021-07-11: 9000 [IU] via INTRAVENOUS

## 2021-07-11 MED ORDER — CLOPIDOGREL BISULFATE 300 MG PO TABS
ORAL_TABLET | ORAL | Status: DC | PRN
  Administered 2021-07-11: 300 mg via ORAL

## 2021-07-11 MED ORDER — FENTANYL CITRATE (PF) 100 MCG/2ML IJ SOLN
INTRAMUSCULAR | Status: DC | PRN
  Administered 2021-07-11: 25 ug via INTRAVENOUS

## 2021-07-11 MED ORDER — IODIXANOL 320 MG/ML IV SOLN
INTRAVENOUS | Status: DC | PRN
  Administered 2021-07-11: 150 mL

## 2021-07-11 MED ORDER — HEPARIN (PORCINE) IN NACL 1000-0.9 UT/500ML-% IV SOLN
INTRAVENOUS | Status: DC | PRN
  Administered 2021-07-11 (×2): 500 mL

## 2021-07-11 MED ORDER — SODIUM CHLORIDE 0.9 % IV SOLN: INTRAVENOUS | Status: DC

## 2021-07-11 MED ORDER — VANCOMYCIN HCL IN DEXTROSE 1-5 GM/200ML-% IV SOLN: 1000.0000 mg | INTRAVENOUS | Status: DC

## 2021-07-11 MED ORDER — CLOPIDOGREL BISULFATE 75 MG PO TABS
75.0000 mg | ORAL_TABLET | Freq: Every day | ORAL | Status: DC
  Administered 2021-07-12 – 2021-07-13 (×2): 75 mg via ORAL
  Filled 2021-07-11 (×2): qty 1

## 2021-07-11 MED ORDER — CLOPIDOGREL BISULFATE 75 MG PO TABS: 75.0000 mg | ORAL_TABLET | Freq: Every day | ORAL | Status: DC

## 2021-07-11 MED ORDER — MIDAZOLAM HCL 2 MG/2ML IJ SOLN
INTRAMUSCULAR | Status: AC
  Filled 2021-07-11: qty 2

## 2021-07-11 MED ORDER — VANCOMYCIN HCL IN DEXTROSE 1-5 GM/200ML-% IV SOLN
1000.0000 mg | INTRAVENOUS | Status: DC
  Administered 2021-07-11 – 2021-07-17 (×7): 1000 mg via INTRAVENOUS
  Filled 2021-07-11 (×8): qty 200

## 2021-07-11 SURGICAL SUPPLY — 24 items
BALLN MUSTANG 5X150X135 (BALLOONS) ×2
BALLN STERLI SL OTW 2.5X80X150 (BALLOONS) ×2
BALLN STERLING OTW 3X20X150 (BALLOONS) ×2
BALLOON MUSTANG 5X150X135 (BALLOONS) IMPLANT
BALLOON STERLING OTW 3X20X150 (BALLOONS) IMPLANT
BALLOON STRLNG OTW 2.5X80X150 (BALLOONS) IMPLANT
CATH OMNI FLUSH 5F 65CM (CATHETERS) ×1 IMPLANT
CATH QUICKCROSS .018X135CM (MICROCATHETER) ×1 IMPLANT
CATH QUICKCROSS SUPP .035X90CM (MICROCATHETER) ×1 IMPLANT
GLIDEWIRE ADV .035X260CM (WIRE) ×1 IMPLANT
KIT ENCORE 26 ADVANTAGE (KITS) ×1 IMPLANT
KIT MICROPUNCTURE NIT STIFF (SHEATH) ×1 IMPLANT
KIT PV (KITS) ×3 IMPLANT
SHEATH FLEX ANSEL ANG 6F 45CM (SHEATH) ×1 IMPLANT
SHEATH PINNACLE 5F 10CM (SHEATH) ×1 IMPLANT
SHEATH PINNACLE 6F 10CM (SHEATH) ×1 IMPLANT
STENT ELUVIA 6X150X130 (Permanent Stent) ×1 IMPLANT
SYR MEDRAD MARK V 150ML (SYRINGE) ×1 IMPLANT
TRANSDUCER W/STOPCOCK (MISCELLANEOUS) ×3 IMPLANT
TRAY PV CATH (CUSTOM PROCEDURE TRAY) ×3 IMPLANT
TUBING CONTRAST HIGH PRESS 48 (TUBING) ×1 IMPLANT
WIRE BENTSON .035X145CM (WIRE) ×1 IMPLANT
WIRE G V18X300CM (WIRE) ×2 IMPLANT
WIRE SPARTACORE .014X300CM (WIRE) ×1 IMPLANT

## 2021-07-11 NOTE — Progress Notes (Incomplete)
Pharmacy Antibiotic Note ? ?Tammy Good is a 71 y.o. female admitted on 07/07/2021 with {Indications:3041527}.  Pharmacy has been consulted for *** dosing. ? ?Plan: ?{Assessment:21075} ? ?Height: '5\' 7"'$  (170.2 cm) ?Weight: 86.9 kg (191 lb 9.3 oz) ?IBW/kg (Calculated) : 61.6 ? ?Temp (24hrs), Avg:98 ?F (36.7 ?C), Min:97.7 ?F (36.5 ?C), Max:98.2 ?F (36.8 ?C) ? ?Recent Labs  ?Lab 07/07/21 ?0325 07/07/21 ?0630 07/08/21 ?0500 07/09/21 ?0241 07/10/21 ?0315 07/10/21 ?1330 07/11/21 ?0628  ?WBC 11.5*  --  7.3 8.7 5.4  --  6.2  ?CREATININE 1.33*  --  0.99 1.03* 0.82  --  0.85  ?LATICACIDVEN 3.1* 1.5  --   --   --   --   --   ?VANCOPEAK  --   --   --   --   --  41*  --   ?  ?Estimated Creatinine Clearance: 70.7 mL/min (by C-G formula based on SCr of 0.85 mg/dL).   ? ?Allergies  ?Allergen Reactions  ? Strawberry Extract Itching, Swelling and Anaphylaxis  ?  Mouth swells and gets itchy  ? ? ?Antimicrobials this admission: ?*** *** >> *** ?*** *** >> *** ? ?Dose adjustments this admission: ?*** ? ?Microbiology results: ?*** BCx: *** ?*** UCx: ***  ?*** Sputum: ***  ?*** MRSA PCR: *** ? ?Thank you for allowing pharmacy to be a part of this patient?s care. ? ?Lawson Radar ?07/11/2021 8:02 AM ? ?

## 2021-07-11 NOTE — Progress Notes (Addendum)
Pharmacy Antibiotic Note ? ?RHYLI Good is a 71 y.o. female admitted on 07/07/2021 osteomyelitis. 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 Pharmacy has been consulted for Vancomycin/cefepime dosing. ? ?VVS completed angiogram on 4/20 and recommended antiplatelet therapy. The patient remains afebrile with WBC within normal limits. Vancomycin dose will be adjusted based on pharmacokinetic calculations. Will continue to follow plans for antibiotic therapy. ? ?Vancomycin Peak 4/19 at 13:30 ?Vancomycin Trough 4/20 at 12:30 ?Calculated AUC on 1250 mg q24h is 669 with CL of 31.1 L/min ? ?Scr 0.85 ?Blood and urine cultures NGTD ? ? ?Plan: ?Decrease Vancomycin to '1000mg'$  q24hr for AUC of 535.5 ?Continue Ceftriaxone 2gm q24hr ?Will monitor for acute changes in renal function and adjust as needed ?F/u cultures results and de-escalate as appropriate ? ?Height: '5\' 7"'$  (170.2 cm) ?Weight: 86.9 kg (191 lb 9.3 oz) ?IBW/kg (Calculated) : 61.6 ? ?Temp (24hrs), Avg:97.9 ?F (36.6 ?C), Min:97.7 ?F (36.5 ?C), Max:98.2 ?F (36.8 ?C) ? ?Recent Labs  ?Lab 07/07/21 ?0325 07/07/21 ?0630 07/08/21 ?0500 07/09/21 ?0241 07/10/21 ?0315 07/10/21 ?1330 07/11/21 ?0628 07/11/21 ?1230  ?WBC 11.5*  --  7.3 8.7 5.4  --  6.2  --   ?CREATININE 1.33*  --  0.99 1.03* 0.82  --  0.85  --   ?LATICACIDVEN 3.1* 1.5  --   --   --   --   --   --   ?[place]  --   --   --   --   --   --   --  14*  ?VANCOPEAK  --   --   --   --   --  98*  --   --   ? ?  ?Estimated Creatinine Clearance: 70.7 mL/min (by C-G formula based on SCr of 0.85 mg/dL).   ? ?Allergies  ?Allergen Reactions  ? Strawberry Extract Itching, Swelling and Anaphylaxis  ?  Mouth swells and gets itchy  ? ? ?Thank you for allowing pharmacy to participate in this patient's care. ? ?Reatha Harps, PharmD ?PGY1 Pharmacy Resident ?07/11/2021 1:46 PM ?Check [email or web address] for unit specific pharmacy number ? ? ? ?

## 2021-07-11 NOTE — Progress Notes (Signed)
Left ankle wound open, patient placed on contact precautions as directed by IP. Devarious Pavek, Bettina Gavia ?RN  ?

## 2021-07-11 NOTE — Op Note (Signed)
? ? ?Patient name: JAHLISA ROSSITTO MRN: [id number] DOB: [birth year] Sex: female ? ?07/11/2021 ?Pre-operative Diagnosis: Critical limb ischemia of the left lower extremity with nonhealing wound ?Post-operative diagnosis:  Same ?Surgeon:  Marty Heck, MD ?Procedure Performed: ?1.  Ultrasound-guided access right common femoral artery ?2.  Aortogram with catheter selection of aorta ?3.  Left lower extremity arteriogram with selection of third order branches ?4.  Left SFA angioplasty with stent placement (6 mm x 150 mm drug-coated Eluvia postdilated with a 5 mm x 150 mm Mustang) ?5.  Left anterior tibial artery angioplasty (3 mm x 20 mm Sterling) ?6.  Left TP trunk angioplasty (2.5 mm and 3 mm x 20 mm Sterling) ?7.  Left posterior tibial artery angioplasty (2.5 mm x 80 mm Sterling)  ?8.  Mynx closure of the right common femoral artery ?9.  132 minutes of monitored moderate conscious sedation time ? ?Indications: Patient is a 71 year old female well-known to the vascular surgery service who previously had left leg interventions including SFA stenting.  She presented with nonhealing left ankle wound and vascular surgery was consulted after a drop in her ABIs to 0.5.  She presents today for aortogram, lower extremity arteriogram, possible intervention with a focus on the left leg after risk benefits discussed, ? ?Findings: Aortogram showed no flow-limiting stenosis in the aortoiliac segment.  The left common femoral and profunda were patent.  The proximal left SFA stent was widely patent.  The mid to distal SFA was diffusely diseased with multiple segments of 50 to 70% calcified stenosis.  The popliteal artery was patent above and below the knee.  Dominant runoff was via the anterior tibial but there was a high-grade stenosis in the proximal anterior tibial with a very robust vessel distally.  The TP trunk had a subtotal occlusion from heavy calcification with reconstitution of a smaller posterior tibial  artery. ? ?Ultimately the left SFA disease was treated with a 6 mm x 150 mm drug-coated Eluvia postdilated with a long 5 mm angioplasty balloon with no residual stenosis and widely patent stent.  Ultimately I got kissing wires down the anterior tibial and down the TP trunk into the posterior tibial.  I initially treated the TP trunk and proximal posterior tibial artery with a 2.5 mm x 80 mm Sterling with minimal results.  I then elected to angioplasty the proximal anterior tibial with a 3 mm x 20 mm Sterling and had excellent results with a widely patent anterior tibial artery and no residual stenosis.  I then noticed significant calcified focal stenosis in the TP trunk and upsized to a 3 mm x 20 mm Sterling here and then treated this with a larger angioplasty balloon and had inline flow down the TP trunk into the posterior tibial.  Patient has two-vessel runoff now.  Significant small vessel disease in the foot. ?  ?Procedure:  The patient was identified in the holding area and taken to room 8.  The patient was then placed supine on the table and prepped and draped in the usual sterile fashion.  A time out was called.  Ultrasound was used to evaluate the right common femoral artery.  It was patent .  A digital ultrasound image was acquired.  A micropuncture needle was used to access the right common femoral artery under ultrasound guidance.  An 018 wire was advanced without resistance and a micropuncture sheath was placed.  The 018 wire was removed and a benson wire was placed.  The micropuncture sheath was  exchanged for a 5 french sheath.  An omniflush catheter was advanced over the wire to the level of L-1.  An abdominal angiogram was obtained.  Next, using the omniflush catheter and a benson wire, the aortic bifurcation was crossed and the catheter was placed into the left external iliac artery and left runoff was obtained.  After evaluating images elected for left leg intervention.  Glidewire advantage was used  through the [employer] catheter down the left SFA.  A long 6 [place] Ansell sheath was placed in the right groin over the aortic bifurcation.  Patient was given 100 units/kg IV heparin.  I then used a quick cross catheter with a Glidewire advantage to get down the left SFA into the popliteal artery where it was less diseased.  The long segment SFA disease was then treated with a long 6 mm x 150 mm drug-coated Eluvia postdilated with a 5 mm balloon with excellent results.  I then went down below the knee and exchanged for a V18 wire with a 018 quick cross.  My initial plan was to get down the anterior tibial but my wire went down the posterior tibial through the diseased TP trunk.  I then put a second wire down the anterior tibial for kissing wires.  I initially angioplastied the TP trunk and proximal posterior tibial disease with a 2.5 mm x 80 mm Sterling to nominal pressure for 2 minutes.  There really was no improvement with no inline flow down the TP trunk.  I then elected to treat the proximal anterior tibial with a 3 mm x 20 mm Sterling to nominal pressure for 2 minutes with excellent results, no residual stenosis, and patent runoff through a very robust anterior tibial.  This image showed residual high-grade calcified disease in the TP trunk so I elected to treat this with a larger balloon using a 3 mm x 20 mm Sterling to nominal pressure for 2 minutes.  Final imaging now showed two-vessel runoff in the anterior tibial and posterior tibial artery.  Wires and catheters were removed.  I put a short 6 French sheath in the right groin and a mynx closure device was deployed. ? ? ?Plan: Patient will need Plavix and statin.  Optimized from a vascular surgery standpoint.   ? ?Marty Heck, MD ?[facility] ?Office: [phone] ? ? ?

## 2021-07-11 NOTE — Progress Notes (Signed)
Pt received from cath lab. VSS. R groin level 0. Pt oriented to room and unit. Call light in reach. ? ?Clyde Canterbury, RN ? ?

## 2021-07-11 NOTE — Progress Notes (Signed)
[facility]  ? ? ? ? ?Objective ?133/86 ?72 ?98.2 ?F (36.8 ?C) (Oral) ?18 ?99% ? ?Intake/Output Summary (Last 24 hours) at 07/11/2021 0838 ?Last data filed at 07/11/2021 0600 ?Gross per 24 hour  ?Intake 70.56 ml  ?Output 0 ml  ?Net 70.56 ml  ? ? ?Left ankle wound ? ?Laboratory ?Lab Results: ?Recent Labs  ?  07/10/21 ?0315 07/11/21 ?7893  ?WBC 5.4 6.2  ?HGB 8.9* 9.2*  ?HCT 28.8* 30.3*  ?PLT 453* 472*  ? ?BMET ?Recent Labs  ?  07/10/21 ?0315 07/11/21 ?0628  ?NA 137 142  ?K 3.9 3.7  ?CL 108 111  ?CO2 23 27  ?GLUCOSE 156* 137*  ?BUN 8 8  ?CREATININE 0.82 0.85  ?CALCIUM 7.9* 8.0*  ? ? ?COAG ?Lab Results  ?Component Value Date  ? INR 1.7 (H) 07/08/2021  ? INR 2.6 (H) 07/07/2021  ? INR 1.1 05/26/2019  ? ?No results found for: PTT ? ?Assessment/Planning: ? ?Plan aortogram, lower extremity arteriogram, focus on the left leg for tissue loss.  Possible endovascular intervention. ? ?Marty Heck ?07/11/2021 ?8:38 AM ?-- ? ? ?

## 2021-07-11 NOTE — Progress Notes (Signed)
I called patient today to see how she was feeling. She underwent angioplasty today and is optimized from a vascular prospective. [name] is also following the patient. We will be by tomorrow to see her to help determine further plans. Patient not yet ready to proceed with BKA. If not then recommend debridement, I&D, bone biopsy.  ?

## 2021-07-11 NOTE — Progress Notes (Addendum)
? ?HD#4 ?SUBJECTIVE:  ?Patient Summary: Tammy Good is a 71 y.o. 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  ? ?Overnight Events: none ? ?Interim History: Patient assessed at bedside following procedure.  She states that she is cold after procedure, she did have some hypoglycemia. No other concerns at this time. ? ?OBJECTIVE:  ?Vital Signs: ?Vitals:  ? 07/10/21 0913 07/10/21 1738 07/10/21 2116 07/11/21 0524  ?BP: 133/67 139/77 140/85 133/86  ?Pulse: 75 86 (!) 52 72  ?Resp: '18 19 18 18  '$ ?Temp: 98.1 ?F (36.7 ?C) 97.7 ?F (36.5 ?C) 97.9 ?F (36.6 ?C) 98.2 ?F (36.8 ?C)  ?TempSrc: Oral Oral  Oral  ?SpO2: 95% 99% 99% 99%  ?Weight:      ?Height:      ? ?Supplemental O2: Room Air ?SpO2: 99 % ?O2 Flow Rate (L/min): 2 L/min ? ?Filed Weights  ? 07/07/21 0630 07/07/21 0804 07/07/21 2107  ?Weight: 101 kg 86.2 kg 86.9 kg  ? ? ? ?Intake/Output Summary (Last 24 hours) at 07/11/2021 0604 ?Last data filed at 07/10/2021 2116 ?Gross per 24 hour  ?Intake 0 ml  ?Output 0 ml  ?Net 0 ml  ? ? ?Net IO Since Admission: 103 mL [07/11/21 0604] ? ?Physical Exam: ?Physical Exam ?HENT:  ?   Head: Normocephalic.  ?   Mouth/Throat:  ?   Mouth: Mucous membranes are moist.  ?Cardiovascular:  ?   Rate and Rhythm: Normal rate and regular rhythm.  ?   Pulses: Normal pulses.  ?   Heart sounds: Normal heart sounds.  ?Pulmonary:  ?   Effort: Pulmonary effort is normal.  ?   Breath sounds: Normal breath sounds.  ?Abdominal:  ?   General: Abdomen is flat. Bowel sounds are normal. There is no distension.  ?   Palpations: Abdomen is soft.  ?   Tenderness: There is no abdominal tenderness.  ?Skin: ?   General: Skin is warm and dry.  ?   Comments: Wound on left side of lower leg with granulation tissue, no purulence of erythema  ?Neurological:  ?   General: No focal  deficit present.  ?   Mental Status: She is alert and oriented to person, place, and time.  ?  ? ?Patient Lines/Drains/Airways Status   ? ? Active Line/Drains/Airways   ? ? Name Placement date Placement time Site Days  ? Peripheral IV 07/07/21 18 G Right Antecubital 07/07/21  0328  Antecubital  1  ? External Urinary Catheter 06/05/21  0000  --  33  ? External Urinary Catheter 07/07/21  2107  --  1  ? Incision (Closed) 05/25/19 Foot 05/25/19  1303  -- 775  ? Incision (Closed) 04/24/21 Ankle Left 04/24/21  2259  -- 75  ? Pressure Injury 05/31/21 Sacrum Mid;Lower Stage 2 -  Partial thickness loss of dermis presenting as a shallow open injury with a red, pink wound bed without slough. denuded area between buttock on sacrum-pink and peeling 05/31/21  0300  -- 38  ? Wound / Incision (Open or Dehisced) 04/23/21 Other (Comment) Ankle Left full thickness wound 04/23/21  --  Ankle  76  ? Wound / Incision (Open or Dehisced) 04/23/21 Other (Comment) Foot Left full thickness wound 04/23/21  --  Foot  76  ? Wound / Incision (Open or Dehisced) 04/26/21 Skin tear Thigh Left;Posterior Pinched while on  [facility] 04/26/21  2306  Thigh  73  ? ?  ?  ? ?  ? ? ?Pertinent Labs: ? ?  Latest Ref Rng & Units 07/10/2021  ?  3:15 AM 07/09/2021  ?  2:41 AM 07/08/2021  ?  5:00 AM  ?CBC  ?WBC 4.0 - 10.5 K/uL 5.4   8.7   7.3    ?Hemoglobin 12.0 - 15.0 g/dL 8.9   9.0   8.6    ?Hematocrit 36.0 - 46.0 % 28.8   28.9   26.9    ?Platelets 150 - 400 K/uL 453   497   480    ? ? ? ?  Latest Ref Rng & Units 07/10/2021  ?  3:15 AM 07/09/2021  ?  2:41 AM 07/08/2021  ?  5:00 AM  ?CMP  ?Glucose 70 - 99 mg/dL 156   271   306    ?BUN 8 - 23 mg/dL '8   12   11    '$ ?Creatinine 0.44 - 1.00 mg/dL 0.82   1.03   0.99    ?Sodium 135 - 145 mmol/L 137   135   137    ?Potassium 3.5 - 5.1 mmol/L 3.9   3.7   3.5    ?Chloride 98 - 111 mmol/L 108   101   107    ?CO2 22 - 32 mmol/L '23   23   21    '$ ?Calcium 8.9 - 10.3 mg/dL 7.9   8.0   8.2    ? ? ?Recent Labs  ?  07/10/21 ?1159 07/10/21 ?1707  07/10/21 ?2112  ?GLUCAP 119* 186* 157*  ? ?  ? ?ASSESSMENT/PLAN:  ?Assessment: ? ?Principal Problem: ?  Osteomyelitis of left foot ([place]) ?Active Problems: ?  Panhypopituitarism ([place]) ?  Diabetes mellitus, type 2 ([place]) ?  Essential hypertension ?  PAD (peripheral artery disease) ([place]) ? ? ? ?Tammy Good is a 71 y.o. 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  ? 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 Blood cultures with no growth to date.  Her antibiotic regimen has been adjusted to vancomycin and ceftriaxone, appreciate ID recommendations.  Drs. Sharol Given and [name] are working on possible surgical options.  ?-Vancomycin, day 5 ?-Ceftriaxone, day 5 ?-appreciate ID recommendations, if no BKA then will plan for a prolonged course of antibiotics ? ?#PAD status post left SFA stenting in 2021. ?Arteriogram completed today, left SFA was treated with stenting and angioplasty. There was significant small vessel disease in the foot. ?- Plavix '75mg'$  daily ?- Atorvastatin '40mg'$  daily ?- Will recehck lipids ? ?#AKI ?Creatinine initially elevated, but improved. ? ?-Trend BMP ? ?#Acquired panhypopituitarism ?Home medications include levothyroxine 125 mcg and dexamethasone 0.5 qd. Will continue dosage for now as patient has procedure tomorrow. ? ?-Hydrocortisone 10 mg in the morning and 5 mg at night ?-Synthroid 125 mcg ? ?#Insulin- dependent diabetes ?Medications include glargine 42 units and NovoLog sliding scale, Farxiga.  She is on chronic steroids. ? ?-Glargine 35 units nightly ?-SSI, resistant ? ?#Thrombocytosis ?Platelets was elevated at 610 on admission. Likely reactive in setting of  chronic infection. ? ?-Trend CBC ? ?#Normocytic anemia ?#History of Non-bleeding esophageal ulcer ?History of prior hospitalization due to GI bleed several months  ago.  No signs of hemoptysis or melena.  Thinking this was likely hemodilution in the setting of hypovolemia with her recent vomiting episodes.  Medications include pantoprazole 40 mg twice daily ? ?-Trend CBC ? ?#Paroxysmal A-fib ?Medications include Eliquis.  This is being held in the setting of possible surgery. ? ?-will consider restarting tomorrow if ortho is not planning for additional interventions ? ?Best Practice: ?Diet: carb modified ?IVF: Fluids: none ?VTE: enoxaparin (LOVENOX) injection 40 mg Start: 07/10/21 1000  ?Code: Full ?AB: Vancomycin, cefazolin ?Therapy Recs: Home Health, her daughter states that she already has a rolling walker at home ?Family Contact: Unable to reach daughter via phone today ?DISPO: Anticipated discharge in 2-3 days to Home pending possible revascularization or orthopedic surgery. ? ?Signature: ?Daleen Bo. Hazely Sealey, D.O.  ?Internal Medicine Resident, PGY-1 ?[facility]  ?Pager: [phone] ?6:04 AM, 07/11/2021  ? ?Please contact the on call pager after 5 pm and on weekends at [phone].  ?

## 2021-07-11 NOTE — Progress Notes (Signed)
Hypoglycemic Event ? ?CBG: 53 ? ?Treatment: 8 oz juice/soda ? ?Symptoms: Hungry ? ?Follow-up CBG: Time:1245 CBG Result:84 ? ?Possible Reasons for Event: Inadequate meal intake, NPO ? ? ?Clyde Canterbury, RN ? ? ?

## 2021-07-11 NOTE — Progress Notes (Signed)
Mobility Specialist Progress Note ? ? 07/11/21 1624  ?Mobility  ?Activity Ambulated with assistance in room;Stood at bedside  ?Level of Assistance Contact guard assist, steadying assist  ?Assistive Device Front wheel walker  ?Distance Ambulated (ft) 26 ft  ?Activity Response Tolerated well  ?$Mobility charge 1 Mobility  ? ?Pre Mobility: 64  HR, 99% SpO2 ?During Mobility: 82 HR, BP, 98% SpO2 ?Post Mobility: 61 HR, 150/61 BP, 100% SpO2 ? ?Received pt in bed c/o being sleepy and wanting to rest but agreeable to in room ambulation. Groin sites stable throughout. Pt c/o of LE getting fatigue towards end of walk but reported no pain, requested to get back in bed. Left call bell by side and RN present in room.  ? ?Holland Falling ?Mobility Specialist ?Phone Number [phone] ? ?

## 2021-07-12 ENCOUNTER — Ambulatory Visit: Payer: Medicare Other | Admitting: Podiatry

## 2021-07-12 DIAGNOSIS — M86272 Subacute osteomyelitis, left ankle and foot: Secondary | ICD-10-CM | POA: Diagnosis not present

## 2021-07-12 DIAGNOSIS — M86172 Other acute osteomyelitis, left ankle and foot: Secondary | ICD-10-CM | POA: Diagnosis not present

## 2021-07-12 LAB — LIPID PANEL
Cholesterol: 80 mg/dL (ref 0–200)
HDL: 22 mg/dL — ABNORMAL LOW (ref >40)
LDL Cholesterol: 37 mg/dL (ref 0–99)
Total CHOL/HDL Ratio: 3.6 RATIO
Triglycerides: 106 mg/dL (ref <150)
VLDL: 21 mg/dL (ref 0–40)

## 2021-07-12 LAB — GLUCOSE, CAPILLARY
Glucose-Capillary: 70 mg/dL (ref 70–99)
Glucose-Capillary: 57 mg/dL — ABNORMAL LOW (ref 70–99)
Glucose-Capillary: 43 mg/dL — CL (ref 70–99)
Glucose-Capillary: 82 mg/dL (ref 70–99)
Glucose-Capillary: 159 mg/dL — ABNORMAL HIGH (ref 70–99)
Glucose-Capillary: 89 mg/dL (ref 70–99)

## 2021-07-12 LAB — CBC
HCT: 28.7 % — ABNORMAL LOW (ref 36.0–46.0)
Hemoglobin: 8.8 g/dL — ABNORMAL LOW (ref 12.0–15.0)
MCH: 26.6 pg (ref 26.0–34.0)
MCHC: 30.7 g/dL (ref 30.0–36.0)
MCV: 86.7 fL (ref 80.0–100.0)
Platelets: 425 10*3/uL — ABNORMAL HIGH (ref 150–400)
RBC: 3.31 MIL/uL — ABNORMAL LOW (ref 3.87–5.11)
RDW: 19.1 % — ABNORMAL HIGH (ref 11.5–15.5)
WBC: 8 10*3/uL (ref 4.0–10.5)
nRBC: 0 % (ref 0.0–0.2)

## 2021-07-12 LAB — BASIC METABOLIC PANEL
Anion gap: 4 — ABNORMAL LOW (ref 5–15)
BUN: 6 mg/dL — ABNORMAL LOW (ref 8–23)
CO2: 25 mmol/L (ref 22–32)
Calcium: 7.8 mg/dL — ABNORMAL LOW (ref 8.9–10.3)
Chloride: 108 mmol/L (ref 98–111)
Creatinine, Ser: 0.75 mg/dL (ref 0.44–1.00)
GFR, Estimated: >60 mL/min (ref >60)
Glucose, Bld: 79 mg/dL (ref 70–99)
Potassium: 3.6 mmol/L (ref 3.5–5.1)
Sodium: 137 mmol/L (ref 135–145)

## 2021-07-12 LAB — CULTURE, BLOOD (ROUTINE X 2): Culture: NO GROWTH

## 2021-07-12 MED ORDER — SENNOSIDES-DOCUSATE SODIUM 8.6-50 MG PO TABS
1.0000 | ORAL_TABLET | Freq: Once | ORAL | Status: AC
  Administered 2021-07-12: 1 via ORAL
  Filled 2021-07-12: qty 1

## 2021-07-12 MED ORDER — POLYETHYLENE GLYCOL 3350 17 G PO PACK: 17.0000 g | PACK | Freq: Every day | ORAL | Status: DC

## 2021-07-12 MED ORDER — POTASSIUM CHLORIDE 20 MEQ PO PACK
40.0000 meq | PACK | Freq: Once | ORAL | Status: AC
  Administered 2021-07-12: 40 meq via ORAL
  Filled 2021-07-12: qty 2

## 2021-07-12 MED ORDER — INSULIN GLARGINE-YFGN 100 UNIT/ML ~~LOC~~ SOLN
30.0000 [IU] | Freq: Every day | SUBCUTANEOUS | Status: DC
  Filled 2021-07-12 (×2): qty 0.3

## 2021-07-12 MED ORDER — POLYETHYLENE GLYCOL 3350 17 G PO PACK
17.0000 g | PACK | Freq: Every day | ORAL | Status: DC
  Filled 2021-07-12: qty 1

## 2021-07-12 NOTE — Care Management Important Message (Signed)
Important Message ? ?Patient Details  ?Name: Tammy Good ?MRN: [id number] ?Date of Birth: 19-Jul-1950 ? ? ?Medicare Important Message Given:  Yes ? ? ? ? ?[name] ?07/12/2021, 9:42 AM ?

## 2021-07-12 NOTE — Progress Notes (Signed)
Inpatient Diabetes Program Recommendations ? ?AACE/ADA: New Consensus Statement on Inpatient Glycemic Control (2015) ? ?Target Ranges:  Prepandial:   less than 140 mg/dL ?     Peak postprandial:   less than 180 mg/dL (1-2 hours) ?     Critically ill patients:  140 - 180 mg/dL  ? ?Lab Results  ?Component Value Date  ? GLUCAP 70 07/12/2021  ? HGBA1C 7.2 (H) 04/23/2021  ? ? ?Review of Glycemic Control ? Latest Reference Range & Units 07/11/21 06:28 07/11/21 07:30 07/11/21 12:11 07/11/21 12:54 07/11/21 15:50 07/11/21 21:20 07/11/21 22:25 07/12/21 06:25  ?Glucose-Capillary 70 - 99 mg/dL 124 (H) 104 (H) 53 (L) 84 92 48 (L) 105 (H) 70  ? ?Diabetes history: DM 2 ?Outpatient Diabetes medications: Farxiga 10 ,g Daily, Novolog 3-8 units tid, Basaglar 42 units qhs ?Current orders for Inpatient glycemic control:  ?Semglee 30 units qhs ? ?Inpatient Diabetes Program Recommendations:   ? ?Hypoglycemia and lower than goal glucose trends. NOTE: pt did not received basal insulin yesterday. Semglee dose reduced slightly today. ? ?Thanks, ? ?Tama Headings RN, MSN, BC-ADM ?Inpatient Diabetes Coordinator ?Team Pager [phone] (8a-5p) ? ? ? ?

## 2021-07-12 NOTE — Progress Notes (Addendum)
?Progress Note ? ? ? ?07/12/2021 ?7:53 AM ?1 Day Post-Op ? ?Subjective:  no complaints ? ? ?Vitals:  ? 07/11/21 2304 07/12/21 0440  ?BP: (!) 116/51 (!) 113/57  ?Pulse: 70 67  ?Resp: 19 18  ?Temp: 97.6 ?F (36.4 ?C) 97.7 ?F (36.5 ?C)  ?SpO2: 98% 97%  ? ?Physical Exam: ?Cardiac:  regular ?Lungs:  non labored ?Incisions:  right cf access site c/d/I without swelling or hematoma ?Extremities: LLE well perfused and warm with palpable DP. Doppler PT/Pero ?Abdomen:  obese, soft ?Neurologic: alert and oriented ? ?CBC ?   ?Component Value Date/Time  ? WBC 8.0 07/12/2021 0118  ? RBC 3.31 (L) 07/12/2021 0118  ? HGB 8.8 (L) 07/12/2021 0118  ? HGB 10.9 (L) 07/03/2021 1250  ? HCT 28.7 (L) 07/12/2021 0118  ? HCT 34.3 07/03/2021 1250  ? PLT 425 (H) 07/12/2021 0118  ? PLT 381 12/21/2020 1524  ? MCV 86.7 07/12/2021 0118  ? MCV 85 07/03/2021 1250  ? MCH 26.6 07/12/2021 0118  ? MCHC 30.7 07/12/2021 0118  ? RDW 19.1 (H) 07/12/2021 0118  ? RDW 17.1 (H) 07/03/2021 1250  ? LYMPHSABS 1.4 07/07/2021 0325  ? LYMPHSABS 1.1 07/03/2021 1250  ? MONOABS 0.8 07/07/2021 0325  ? EOSABS 0.2 07/07/2021 0325  ? EOSABS 0.1 07/03/2021 1250  ? BASOSABS 0.1 07/07/2021 0325  ? BASOSABS 0.1 07/03/2021 1250  ? ? ?BMET ?   ?Component Value Date/Time  ? NA 137 07/12/2021 0118  ? NA 140 07/03/2021 1250  ? K 3.6 07/12/2021 0118  ? CL 108 07/12/2021 0118  ? CO2 25 07/12/2021 0118  ? GLUCOSE 79 07/12/2021 0118  ? BUN 6 (L) 07/12/2021 0118  ? BUN 13 07/03/2021 1250  ? CREATININE 0.75 07/12/2021 0118  ? CREATININE 1.12 (H) 05/24/2021 1405  ? CALCIUM 7.8 (L) 07/12/2021 0118  ? GFRNONAA >60 07/12/2021 0118  ? GFRAA 21 (L) 06/23/2019 1530  ? ? ?INR ?   ?Component Value Date/Time  ? INR 1.7 (H) 07/08/2021 0500  ? ? ? ?Intake/Output Summary (Last 24 hours) at 07/12/2021 0753 ?Last data filed at 07/11/2021 1856 ?Gross per 24 hour  ?Intake 480 ml  ?Output 650 ml  ?Net -170 ml  ? ? ? ?Assessment/Plan:  71 y.o. female is s/p Left lower extremity arteriogram with selection of third  order branches, Left SFA angioplasty with stent placement (6 mm x 150 mm drug-coated Eluvia postdilated with a 5 mm x 150 mm Mustang), Left anterior tibial artery angioplasty (3 mm x 20 mm Sterling), Left TP trunk angioplasty (2.5 mm and 3 mm x 20 mm Sterling), Left posterior tibial artery angioplasty (2.5 mm x 80 mm Sterling)  1 Day Post-Op  ? ?LLE well perfused and warm with palpable DP. Doppler PT and Peroneal signals ?Right CF access c/d/I without swelling or hematoma ?She has been optimized from vascular standpoint ?Continue Plavix and Statin ?[name] following as well as [name] for foot management ?Will arrange outpatient follow up in 1 month with non invasive studies ? ?[name], PA-C ?[facility] ?[phone] ?07/12/2021 ?7:53 AM ? ?I have seen and evaluated the patient. I agree with the PA note as documented above.  Excellent results after left lower extremity revascularization yesterday including left SFA stent as well as left tibial angioplasty.  Palpable DP pulse today.  Right groin transfemoral access site looks good.  Discussed Plavix statin from my standpoint given she is also on Eliquis.  Please call vascular with questions or concerns.  Will arrange follow-up in one month. ? ?Marty Heck, MD ?[facility] ?Office: [phone] ? ?

## 2021-07-12 NOTE — Progress Notes (Signed)
? ?HD#5 ?SUBJECTIVE:  ?Patient Summary: Tammy Good is a 71 y.o. 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  ? ?Overnight Events: none ? ?Interim History: Patient assessed at bedside this AM. She states that she is not feeling that well, she feels that she needs to make a bowel movement. She continues to have no pain in her foot. No other concerns at this time. ? ?OBJECTIVE:  ?Vital Signs: ?Vitals:  ? 07/11/21 2304 07/12/21 0440 07/12/21 1059 07/12/21 1100  ?BP: (!) 116/51 (!) 113/57 (!) 120/50 (!) 101/58  ?Pulse: 70 67  83  ?Resp: 19 18    ?Temp: 97.6 ?F (36.4 ?C) 97.7 ?F (36.5 ?C)    ?TempSrc: Oral Oral    ?SpO2: 98% 97%    ?Weight:      ?Height:      ? ?Supplemental O2: Room Air ?SpO2: 97 % ?O2 Flow Rate (L/min): 2 L/min ? ?Filed Weights  ? 07/07/21 0630 07/07/21 0804 07/07/21 2107  ?Weight: 101 kg 86.2 kg 86.9 kg  ? ? ? ?Intake/Output Summary (Last 24 hours) at 07/12/2021 1130 ?Last data filed at 07/11/2021 1856 ?Gross per 24 hour  ?Intake 480 ml  ?Output 650 ml  ?Net -170 ml  ? ? ?Net IO Since Admission: 3.56 mL [07/12/21 1130] ? ?Physical Exam: ?Physical Exam ?HENT:  ?   Head: Normocephalic.  ?   Mouth/Throat:  ?   Mouth: Mucous membranes are moist.  ?Cardiovascular:  ?   Rate and Rhythm: Normal rate and regular rhythm.  ?   Pulses: Normal pulses.  ?   Heart sounds: Normal heart sounds.  ?   Comments: No hematoma at right femoral access site ?Pulmonary:  ?   Effort: Pulmonary effort is normal.  ?   Breath sounds: Normal breath sounds.  ?Abdominal:  ?   General: Abdomen is flat. Bowel sounds are normal. There is no distension.  ?   Palpations: Abdomen is soft.  ?   Tenderness: There is no abdominal tenderness.  ?Skin: ?   General: Skin is warm and dry.  ?   Comments: Wound on left side of lower leg with granulation  tissue, no purulence of erythema  ?Neurological:  ?   General: No focal deficit present.  ?   Mental Status: She is alert and oriented to person, place, and time.  ?  ? ?Patient Lines/Drains/Airways Status   ? ? Active Line/Drains/Airways   ? ? Name Placement date Placement time Site Days  ? Peripheral IV 07/07/21 18 G Right Antecubital 07/07/21  0328  Antecubital  1  ? External Urinary Catheter 06/05/21  0000  --  33  ? External Urinary Catheter 07/07/21  2107  --  1  ? Incision (Closed) 05/25/19 Foot 05/25/19  1303  -- 775  ? Incision (Closed) 04/24/21 Ankle Left 04/24/21  2259  -- 75  ? Pressure Injury 05/31/21 Sacrum Mid;Lower Stage 2 -  Partial thickness loss of dermis presenting as a shallow open injury with a red, pink wound bed without slough. denuded area between buttock on sacrum-pink and peeling 05/31/21  0300  -- 38  ? Wound / Incision (Open or Dehisced) 04/23/21 Other (Comment) Ankle Left full thickness wound 04/23/21  --  Ankle  76  ? Wound / Incision (Open or Dehisced) 04/23/21 Other (Comment) Foot Left full thickness wound 04/23/21  --  Foot  76  ? Wound / Incision (Open or Dehisced) 04/26/21 Skin tear Thigh Left;Posterior Pinched while on [facility] 04/26/21  2306  Thigh  73  ? ?  ?  ? ?  ? ? ?Pertinent Labs: ? ?  Latest Ref Rng & Units 07/12/2021  ?  1:18 AM 07/11/2021  ?  6:28 AM 07/10/2021  ?  3:15 AM  ?CBC  ?WBC 4.0 - 10.5 K/uL 8.0   6.2   5.4    ?Hemoglobin 12.0 - 15.0 g/dL 8.8   9.2   8.9    ?Hematocrit 36.0 - 46.0 % 28.7   30.3   28.8    ?Platelets 150 - 400 K/uL 425   472   453    ? ? ? ?  Latest Ref Rng & Units 07/12/2021  ?  1:18 AM 07/11/2021  ?  6:28 AM 07/10/2021  ?  3:15 AM  ?CMP  ?Glucose 70 - 99 mg/dL 79   137   156    ?BUN 8 - 23 mg/dL '6   8   8    '$ ?Creatinine 0.44 - 1.00 mg/dL 0.75   0.85   0.82    ?Sodium 135 - 145 mmol/L 137   142   137    ?Potassium 3.5 - 5.1 mmol/L 3.6   3.7   3.9    ?Chloride 98 - 111 mmol/L 108   111   108    ?CO2 22 - 32 mmol/L '25   27   23    '$ ?Calcium 8.9 - 10.3 mg/dL  7.8   8.0   7.9    ? ? ?Recent Labs  ?  07/11/21 ?2120 07/11/21 ?2225 07/12/21 ?2542  ?GLUCAP 48* 105* 70  ? ?  ? ?ASSESSMENT/PLAN:  ?Assessment: ? ?Principal Problem: ?  Osteomyelitis of left foot ([place]) ?Active Problems: ?  Panhypopituitarism ([place]) ?  Diabetes mellitus, type 2 ([place]) ?  Essential hypertension ?  PAD (peripheral artery disease) ([place]) ? ? ? ?Tammy Good is a 71 y.o. 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  ? 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 [name] and [name] are on board for help with determining if BKA will be done or debridement, I&D with bone biopsy. ? ?-Vancomycin, day 6 ?-Ceftriaxone, day 6 ?-appreciate ID recommendations, if no BKA then will plan for a prolonged course of antibiotics.  ? ?#PAD status post left SFA stenting in 2021. ?Arteriogram completed today, left SFA was treated with stenting and angioplasty. There was significant small vessel disease in the foot. LDL at 22. ? ?- Plavix '75mg'$  daily ?- Atorvastatin '40mg'$  daily ? ?#AKI ?Creatinine initially elevated, but improved. ? ?-Trend BMP ? ?#Acquired panhypopituitarism ?Home medications include levothyroxine 125 mcg and dexamethasone 0.5 qd. Will continue dosage for now as patient has procedure tomorrow. ? ?-Hydrocortisone 10 mg in the morning and 5 mg at night ?-Synthroid 125 mcg ? ?#Insulin- dependent diabetes ?Medications include glargine 42 units and NovoLog sliding scale, Farxiga.  She is on chronic steroids. Her blood sugars were lower this morning even after evening dose of basal was held.  She states that she did not eat much at dinner because her  stomach hurts and she needs to make a bowel movement. Will decrease basal dose today. ? ?-Glargine 30 units nightly ?-SSI, resistant ? ?#Constipation ?Miralax and senna given. If she continues to be constipated will schedule. She takes Miralax at home when needed.  ? ?#Thrombocytosis ?Platelets was elevated at 610 on admission. Likely reactive in setting of chronic infection. ? ?-Trend CBC ? ?#Normocytic anemia ?#History of Non-bleeding esophageal ulcer ?History of prior hospitalization due to GI bleed several months ago.  No signs of hemoptysis or melena.  Thinking this was likely hemodilution in the setting of hypovolemia with her recent vomiting episodes.  Medications include pantoprazole 40 mg twice daily ? ?-Trend CBC ? ?#Paroxysmal A-fib ?Medications include Eliquis. ? ?-Eliquis restarted 4/20 ? ?Best Practice: ?Diet: carb modified ?IVF: Fluids: none ?VTE:   ?Code: Full ?AB: Vancomycin, cefazolin ?Therapy Recs: Home Health, her daughter states that she already has a rolling walker at home ?Family Contact: daughter updated 4/20 via phone ?DISPO: Anticipated discharge in 1-2 days to Home pending possible orthopedic surgery. ? ?Signature: ?Daleen Bo. Zaraya Delauder, D.O.  ?Internal Medicine Resident, PGY-1 ?[facility]  ?Pager: [phone] ?11:30 AM, 07/12/2021  ? ?Please contact the on call pager after 5 pm and on weekends at [phone].  ?

## 2021-07-12 NOTE — Progress Notes (Addendum)
?   ? ? ? ? ?[facility] for [facility] ? ?Date of Admission:  07/07/2021   Total days of inpatient antibiotics 3 ? ?Principal Problem: ?  Osteomyelitis of left foot ([place]) ?Active Problems: ?  Panhypopituitarism ([place]) ?  Diabetes mellitus, type 2 ([place]) ?  Essential hypertension ?  PAD (peripheral artery disease) ([place]) ?     ?    ?Assessment: ?71 year old female with history of MRSA bacteremia secondary to left foot infection/osteomyelitis admitted for worsening left foot infection.  She was placed on linezolid 4/13 with podiatry due to worsening wound. ?  ?  ?#New osteomyelitis of cuboid, talar head, navicular bone,  ?#Progressive osteomyelitis of calcaneus ?#Calcaneocuboid and talonavicular septic arthritis ?#Vertigo associated with Linezolid ?-ABIs showed moderate left lower extremity arterial disease.  MRI findings as above .  ?-She underwent arteriogram of LLE with LFT SPA stent/angioplasty on 4/20 with vascular. Optimized from vascular standpoint. Ortho and podiatry following.  ? ?#Hx of MRSA bacteremia likely 2/2 foot abscess/osteomyelitis ?-Admitted 2/1-2/4 with MRSA bacteremia, MRI showed increased marrow edema distal fibula concerning for osteomyelitis. Underwent debridement with podiatry and bone Bx with wounds Cx of abscess+ MRSA. Bone bx path showed fibrosis + slight inflammation without necrotic bone.  Pt was treated with 2 weeks of IV vanc->2 week of PO linezolid. Seen in ID clinic on 3/3, noted to have severe vertigo may have been associated with linezolid. Plan was to monitor pt off antibiotics.  ?-Pt presented with fever, leukocytosis with Hx of recent MRSA wound infection and worsening MRI findings(osteomyelitis) worsened off of antibiotics. These findings are consistent extensive osteomyelitis, as such pt is on broad spectrum antibiotics.  ? ?Recommendations:  ?-Continue vancomycin and ceftriaxone ?-If pt undergoes BKA, she will not need long course of antibiotics. If no plans for  amputation, please obtain bone Bx with Cx(bacterial/afb/funga) and pathology.  ? ? ?  ?Microbiology:   ?Antibiotics: ?Vancomycin 4/16-p ?Metronidazole 4/16-4/16 ?Cefepime 4/16-18 ?Ceftriaxone 4/19-p ?Cultures: ?Blood ?4/15 NG ?  ? ?SUBJECTIVE: ?Resting in bed. No new complaints ?Interval: afebrile overnight ? ?Review of Systems: ?Review of Systems  ?All other systems reviewed and are negative. ? ? ?Scheduled Meds: ? apixaban  5 mg Oral BID  ? atorvastatin  40 mg Oral q1800  ? Chlorhexidine Gluconate Cloth  6 each Topical Q0600  ? clopidogrel  75 mg Oral Daily  ? collagenase  1 application. Topical Daily  ? hydrocortisone  10 mg Oral Daily  ? hydrocortisone  5 mg Oral Daily  ? insulin aspart  0-20 Units Subcutaneous TID WC  ? insulin glargine-yfgn  30 Units Subcutaneous QHS  ? levothyroxine  125 mcg Oral QAC breakfast  ? midodrine  5 mg Oral BID WC  ? mupirocin ointment  1 application. Nasal BID  ? pantoprazole  40 mg Oral BID AC  ? polyethylene glycol  17 g Oral Daily  ? senna-docusate  1 tablet Oral Once  ? sodium chloride flush  3 mL Intravenous Q12H  ? ?Continuous Infusions: ? sodium chloride    ? cefTRIAXone (ROCEPHIN)  IV 2 g (07/12/21 0939)  ? vancomycin 1,000 mg (07/11/21 1544)  ? ?PRN Meds:.sodium chloride, acetaminophen, hydrALAZINE, labetalol, ondansetron (ZOFRAN) IV, sodium chloride flush ?Allergies  ?Allergen Reactions  ? Strawberry Extract Itching, Swelling and Anaphylaxis  ?  Mouth swells and gets itchy  ? ? ?OBJECTIVE: ?Vitals:  ? 07/11/21 1546 07/11/21 2100 07/11/21 2304 07/12/21 0440  ?BP: (!) 141/74 (!) 119/50 (!) 116/51 (!) 113/57  ?Pulse: 68 71  70 67  ?Resp: '15 20 19 18  '$ ?Temp: 97.7 ?F (36.5 ?C)  97.6 ?F (36.4 ?C) 97.7 ?F (36.5 ?C)  ?TempSrc: Oral  Oral Oral  ?SpO2: 100% 97% 98% 97%  ?Weight:      ?Height:      ? ?Body mass index is 30.01 kg/m?. ? ?Physical Exam ?Constitutional:   ?   Appearance: Normal appearance.  ?HENT:  ?   Head: Normocephalic and atraumatic.  ?   Right Ear: Tympanic membrane  normal.  ?   Left Ear: Tympanic membrane normal.  ?   Nose: Nose normal.  ?   Mouth/Throat:  ?   Mouth: Mucous membranes are moist.  ?Eyes:  ?   Extraocular Movements: Extraocular movements intact.  ?   Conjunctiva/sclera: Conjunctivae normal.  ?   Pupils: Pupils are equal, round, and reactive to light.  ?Cardiovascular:  ?   Rate and Rhythm: Normal rate and regular rhythm.  ?   Heart sounds: No murmur heard. ?  No friction rub. No gallop.  ?Pulmonary:  ?   Effort: Pulmonary effort is normal.  ?   Breath sounds: Normal breath sounds.  ?Abdominal:  ?   General: Abdomen is flat.  ?   Palpations: Abdomen is soft.  ?Musculoskeletal:  ?   Comments: Left foot wound bandaged  ?Skin: ?   General: Skin is warm and dry.  ?Neurological:  ?   General: No focal deficit present.  ?   Mental Status: She is alert and oriented to person, place, and time.  ?Psychiatric:     ?   Mood and Affect: Mood normal.  ? ? ? ? ?Lab Results ?Lab Results  ?Component Value Date  ? WBC 8.0 07/12/2021  ? HGB 8.8 (L) 07/12/2021  ? HCT 28.7 (L) 07/12/2021  ? MCV 86.7 07/12/2021  ? PLT 425 (H) 07/12/2021  ?  ?Lab Results  ?Component Value Date  ? CREATININE 0.75 07/12/2021  ? BUN 6 (L) 07/12/2021  ? NA 137 07/12/2021  ? K 3.6 07/12/2021  ? CL 108 07/12/2021  ? CO2 25 07/12/2021  ?  ?Lab Results  ?Component Value Date  ? ALT 11 07/07/2021  ? AST 17 07/07/2021  ? ALKPHOS 97 07/07/2021  ? BILITOT 0.9 07/07/2021  ?  ? ? ? ? ?Laurice Record, MD ?[facility] for Infectious Disease ?[facility] ?07/12/2021, 11:05 AM  ?

## 2021-07-12 NOTE — Progress Notes (Addendum)
Physical Therapy Treatment ?Patient Details ?Name: Tammy Good ?MRN: [id number] ?DOB: 09-Nov-1950 ?Today's Date: 07/12/2021 ? ? 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 Pt with suspected recurrent SFA disease. Plan for anteriogram with possible L LE intervention 4/20. 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 ? ?  ?PT Comments  ? ? Pt received in supine, agreeable to physical therapy session with encouragement and emphasis on gait training. Pt making progress toward PT goals, exhibiting increased activity tolerance during functional activities. Pt powered up with min guard assist with pivot transfer to [facility] with cuing for foot placement. Pt demonstrates good safety awareness while looking behind and reaching for BSC before sitting. Pt ambulated 20 feet in the hospital room and returned back to the bed due to feeling tired, more due to lack of sleep, rather than due to muscle fatigue. Pt would benefit from increased distance while gait training and stairs with increased emphasis on step length and speed during functional activity. Plan for next session is to complete stairs training to increase patients mobility and functional independence. Pt left in bed, with call bell in reach, and bed alarm set. ?  ?Recommendations for follow up therapy are one component of a multi-disciplinary discharge planning process, led by the attending physician.  Recommendations may be updated based on patient status, additional functional criteria and insurance authorization. ? ?Follow Up Recommendations ? Home health PT ?  ?  ?Assistance Recommended at Discharge Set up Supervision/Assistance  ?Patient can return home with the following Assist for transportation;Help with stairs or ramp for entrance;Assistance with cooking/housework ?  ?Equipment Recommendations ? Rolling walker (2 wheels)  ?  ?Recommendations for Other Services OT  consult ? ? ?  ?Precautions / Restrictions Precautions ?Precautions: Fall ?Restrictions ?Weight Bearing Restrictions: No  ?  ? ?Mobility ? Bed Mobility ?Overal bed mobility: needs assist ?  ?  ?Supine to sit: Min guard ?Sit to supine: Min guard ?  ?  ?  ? ?Transfers ?Overall transfer level: Modified independent ?Equipment used: Rolling walker (2 wheels) ?Transfers: Sit to/from Stand, Bed to chair/wheelchair/BSC ?Sit to Stand: Modified independent (Device/Increase time) ?  ?Step pivot transfers: Modified independent (Device/Increase time) ?  ?  ?  ?General transfer comment: from [facility] > bed ?  ? ?Ambulation/Gait ?Ambulation/Gait assistance: Min guard ?Gait Distance (Feet): 20 Feet ?Assistive device: Rolling walker (2 wheels) ?Gait Pattern/deviations: Step-through pattern ?Gait velocity: slowed ?  ? ? ? ?  ?Balance Overall balance assessment: Modified Independent ?Sitting-balance support: No upper extremity supported, Feet supported ?Sitting balance-Leahy Scale: Good ?  ?  ?Standing balance support: During functional activity, Bilateral upper extremity supported ?Standing balance-Leahy Scale: Good ?  ?  ?  ? ?  ?Cognition Arousal/Alertness: Awake/alert ?Behavior During Therapy: [facility] for tasks assessed/performed ?Overall Cognitive Status: Within Functional Limits for tasks assessed ?  ?  ?  ?  ?General Comments: pt reporting 8/10 RPE end of session, more due to lack of sleep, rather than due to muscle fatigue ?  ?  ? ?  ?   ?   ? ?Pertinent Vitals/Pain Pain Assessment ?Pain Assessment: No/denies pain  ? ? ?   ?   ? ?PT Goals (current goals can now be found in the care plan section) Acute Rehab PT Goals ?Patient Stated Goal: pt wants to go home ?PT Goal Formulation: With patient ?Time For Goal Achievement: 07/23/21 ?Potential to  Achieve Goals: Good ?Progress towards PT goals: Progressing toward goals ? ?  ?Frequency ? ? ? Min 3X/week ? ? ? ?  ?PT Plan Current plan remains appropriate  ? ? ?   ?AM-PAC PT "6 Clicks" Mobility    ?Outcome Measure ? Help needed turning from your back to your side while in a flat bed without using bedrails?: None ?Help needed moving from lying on your back to sitting on the side of a flat bed without using bedrails?: None ?Help needed moving to and from a bed to a chair (including a wheelchair)?: None ?Help needed standing up from a chair using your arms (e.g., wheelchair or bedside chair)?: A Little ?Help needed to walk in hospital room?: A Little ?Help needed climbing 3-5 steps with a railing? : A Little ?6 Click Score: 21 ? ?  ?End of Session Equipment Utilized During Treatment: Gait belt ?Activity Tolerance: Patient tolerated treatment well ?Patient left: in bed;with call bell/phone within reach;with bed alarm set ?Nurse Communication: Mobility status ?PT Visit Diagnosis: Muscle weakness (generalized) (M62.81) ?  ? ? ?Time: 6283-1517 ?PT Time Calculation (min) (ACUTE ONLY): 17 min ? ?Charges:  $Therapeutic Activity: 8-22 mins             ?          ? ?Wadie Lessen, SPTA ? ? ? ?[name] ?07/12/2021, 1:12 PM ? ?

## 2021-07-12 NOTE — Progress Notes (Signed)
Occupational Therapy Treatment ?Patient Details ?Name: Tammy Good ?MRN: [id number] ?DOB: 11-03-1950 ?Today's Date: 07/12/2021 ? ? 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 Pt with suspected recurrent SFA disease. Plan for anteriogram with possible L LE intervention 4/20. 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 ?  ?OT comments ? Pt received sitting on BSC, able to complete remainder of toileting task with Setup Assist and transfer back to bed using RW with Modified Independence. Planned to engage in further ADLs though pt declined due to nausea and constipation. DC recs remain appropriate.   ? ?Recommendations for follow up therapy are one component of a multi-disciplinary discharge planning process, led by the attending physician.  Recommendations may be updated based on patient status, additional functional criteria and insurance authorization. ?   ?Follow Up Recommendations ? No OT follow up  ?  ?Assistance Recommended at Discharge PRN  ?Patient can return home with the following ? Assist for transportation;Help with stairs or ramp for entrance ?  ?Equipment Recommendations ? None recommended by OT  ?  ?Recommendations for Other Services   ? ?  ?Precautions / Restrictions Precautions ?Precautions: Fall ?Restrictions ?Weight Bearing Restrictions: No  ? ? ?  ? ?Mobility Bed Mobility ?Overal bed mobility: Modified Independent ?Bed Mobility: Sit to Supine ?  ?  ?  ?Sit to supine: Modified independent (Device/Increase time) ?  ?General bed mobility comments: uncontrolled descent to bed though able to correct ?  ? ?Transfers ?Overall transfer level: Modified independent ?Equipment used: Rolling walker (2 wheels) ?Transfers: Sit to/from Stand, Bed to chair/wheelchair/BSC ?Sit to Stand: Modified independent (Device/Increase time) ?  ?  ?Step pivot transfers: Modified independent (Device/Increase time) ?  ?  ?General  transfer comment: from [facility] > bed ?  ?  ?Balance Overall balance assessment: Needs assistance ?Sitting-balance support: No upper extremity supported, Feet supported ?Sitting balance-Leahy Scale: Good ?  ?  ?Standing balance support: No upper extremity supported, During functional activity ?Standing balance-Leahy Scale: Fair ?  ?  ?  ?  ?  ?  ?  ?  ?  ?  ?  ?  ?   ? ?ADL either performed or assessed with clinical judgement  ? ?ADL Overall ADL's : Needs assistance/impaired ?  ?  ?  ?  ?  ?  ?  ?  ?  ?  ?  ?  ?Toilet Transfer: Modified Independent;Stand-pivot;BSC/3in1;Rolling walker (2 wheels) ?Toilet Transfer Details (indicate cue type and reason): received on BSC, able to stand with RW and turn back to bed without LOB ?Toileting- Clothing Manipulation and Hygiene: Set up;Sit to/from stand ?Toileting - Clothing Manipulation Details (indicate cue type and reason): provided toilet paper within reach with pt able to complete hygiene in standing without LOB or safety concerns ?  ?  ?  ?General ADL Comments: Limited by nausea and reports of constipation. Provided cool washcloth on forehead, lowered AC in room, offered ginger ale and notified RN of reported symptoms. Pt still functioning physically well despite nausea today ?  ? ?Extremity/Trunk Assessment Upper Extremity Assessment ?Upper Extremity Assessment: Overall WFL for tasks assessed ?  ?Lower Extremity Assessment ?Lower Extremity Assessment: Defer to PT evaluation ?  ?  ?  ? ?Vision   ?Vision Assessment?: No apparent visual deficits ?  ?Perception   ?  ?Praxis   ?  ? ?Cognition Arousal/Alertness: Awake/alert ?Behavior During Therapy: [facility] for  tasks assessed/performed ?Overall Cognitive Status: Within Functional Limits for tasks assessed ?  ?  ?  ?  ?  ?  ?  ?  ?  ?  ?  ?  ?  ?  ?  ?  ?  ?  ?  ?   ?Exercises   ? ?  ?Shoulder Instructions   ? ? ?  ?General Comments VSS on RA  ? ? ?Pertinent Vitals/ Pain       Pain Assessment ?Pain Assessment: No/denies pain ? ?Home  Living   ?  ?  ?  ?  ?  ?  ?  ?  ?  ?  ?  ?  ?  ?  ?  ?  ?  ?  ? ?  ?Prior Functioning/Environment    ?  ?  ?  ?   ? ?Frequency ? Min 2X/week  ? ? ? ? ?  ?Progress Toward Goals ? ?OT Goals(current goals can now be found in the care plan section) ? Progress towards OT goals: Progressing toward goals ? ?Acute Rehab OT Goals ?Patient Stated Goal: resolve constipation, return home soon ?OT Goal Formulation: With patient ?Time For Goal Achievement: 07/23/21 ?Potential to Achieve Goals: Good ?ADL Goals ?Pt Will Transfer to Toilet: with modified independence;ambulating ?Additional ADL Goal #1: Pt to gather ADL/IADL items with MOD I without LOB or safety concerns ?Additional ADL Goal #2: Pt to increase standing activity tolerance > 7 min to maximize endurance for ADLs/IADLs  ?Plan Discharge plan remains appropriate   ? ?Co-evaluation ? ? ?   ?  ?  ?  ?  ? ?  ?AM-PAC OT "6 Clicks" Daily Activity     ?Outcome Measure ? ? Help from another person eating meals?: None ?Help from another person taking care of personal grooming?: None ?Help from another person toileting, which includes using toliet, bedpan, or urinal?: None ?Help from another person bathing (including washing, rinsing, drying)?: A Little ?Help from another person to put on and taking off regular upper body clothing?: None ?Help from another person to put on and taking off regular lower body clothing?: A Little ?6 Click Score: 22 ? ?  ?End of Session Equipment Utilized During Treatment: Rolling walker (2 wheels) ? ?OT Visit Diagnosis: Other abnormalities of gait and mobility (R26.89) ?  ?Activity Tolerance Patient tolerated treatment well;Other (comment) (limited by nausea/constipation) ?  ?Patient Left in bed;with call bell/phone within reach ?  ?Nurse Communication Mobility status;Other (comment) (nausea) ?  ? ?   ? ?Time: 6301-6010 ?OT Time Calculation (min): 9 min ? ?Charges: OT General Charges ?$OT Visit: 1 Visit ?OT Treatments ?$Self Care/Home Management :  8-22 mins ? ?Malachy Chamber, OTR/L ?Acute Rehab Services ?Office: [phone]  ? ?Layla Maw ?07/12/2021, 8:48 AM ?

## 2021-07-12 NOTE — Progress Notes (Signed)
?Subjective:  ?Patient ID: Tammy Good, female    DOB: 1950/04/09,  MRN: [id number] ? ?Patient seen at bedside this afternoon. She is s/p revascularization of left lower extremity. Relates she is doing well and not having any pain. Patient relates that she would like to avoid amputation and try antibiotics.  ? ?Past Medical History:  ?Diagnosis Date  ? CAD S/P percutaneous coronary angioplasty 08/2013  ? 100% mRCA - PCI Integrity Resolute DES 3.0 mm x 38 mm - 3.35 mm; PTCA of RPA V 2.0 mm x 15 mm  ? Cholesteatoma   ? right  ? Diabetes mellitus type 2 in obese [facility])   ? On insulin and Invokana  ? History of osteomyelitis L 5th Toe all 05/2019  ? s/p Partial Ray Amputation with partial closure; 6 wks Abx & LSFA Atherectomy/DEB PTA with Stent for focal dissection.  ? Hyperlipidemia with target LDL less than 70   ? Hypothyroidism (acquired)   ? Mild essential hypertension   ? Obesity (BMI 30-39.9) 11/17/2013  ? PAD (peripheral artery disease) ([place]) 05/26/2019  ? 05/26/19: Abd AoGram- BLE runoff -> L SFA orbital atherectomy - PTA w/ DEB & Stent 6 x 40 Luttonix (for focal dissection) - patent Pop A with 3 V runoff. LEA Dopplers 01/03/2020: RABI (prev) 0.68 (0.53)/ RTBI (prev) 0.58 (0.33); LABI (prev) 0.80 (0.64), LTBI (prev) 0.64 (0.51); R mSFA ~50-74%, L mSFA 50-74%. Patent Prox SFA stent < 49% stenosis  ? Panhypopituitarism ([place])   ? ST elevation myocardial infarction (STEMI) of inferior wall, subsequent episode of care [facility]) 08/2013  ? 80% branch of D1, 40% mid AV groove circumflex, 100% RCA with subacute thrombus -- thrombus extending into RPA V with 100% occlusion after initial angioplasty of mid RCA ;; Post MI ECHO 6/9/'15: EF 50-55%, mild LVH with moderate HK of inferior wall, Gr1 DD, mild LA dilation; mildly reduced RV function  ?  ? ?Past Surgical History:  ?Procedure Laterality Date  ? ABDOMINAL AORTOGRAM W/LOWER EXTREMITY N/A 05/26/2019  ? Procedure: ABDOMINAL AORTOGRAM W/LOWER EXTREMITY;  Surgeon: Marty Heck, MD;  Location: [place] CV LAB;  Service: Cardiovascular;  Laterality: N/A;  ? ABDOMINAL AORTOGRAM W/LOWER EXTREMITY N/A 11/15/2020  ? Procedure: ABDOMINAL AORTOGRAM W/LOWER EXTREMITY;  Surgeon: Marty Heck, MD;  Location: [place] CV LAB;  Service: Cardiovascular;  Laterality: N/A;  ? ABDOMINAL AORTOGRAM W/LOWER EXTREMITY N/A 04/18/2021  ? Procedure: ABDOMINAL AORTOGRAM W/LOWER EXTREMITY;  Surgeon: Marty Heck, MD;  Location: [place] CV LAB;  Service: Cardiovascular;  Laterality: N/A;  ? ABDOMINAL AORTOGRAM W/LOWER EXTREMITY Left 07/11/2021  ? Procedure: ABDOMINAL AORTOGRAM W/LOWER EXTREMITY;  Surgeon: Marty Heck, MD;  Location: [place] CV LAB;  Service: Cardiovascular;  Laterality: Left;  ? AMPUTATION Left 05/25/2019  ? Procedure: AMPUTATION RAY 5th;  Surgeon: Trula Slade, DPM;  Location: [place];  Service: Podiatry;  Laterality: Left;  ? BONE BIOPSY Left 04/24/2021  ? Procedure: BONE BIOPSY;  Surgeon: Landis Martins, DPM;  Location: [place];  Service: Podiatry;  Laterality: Left;  left ankle/fibula  ? Cardiac Event Monitor  July-August 2015  ? Sinus rhythm with PVCs  ? COLONOSCOPY N/A 08/31/2013  ? Procedure: COLONOSCOPY;  Surgeon: Juanita Craver, MD;  Location: [facility] ENDOSCOPY;  Service: Endoscopy;  Laterality: N/A;  ? CORONARY STENT INTERVENTION N/A 11/19/2020  ? Procedure: CORONARY STENT INTERVENTION;  Surgeon: Nelva Bush, MD;  Location: [place] CV LAB;  Service: Cardiovascular;  Laterality: N/A;  ? ESOPHAGOGASTRODUODENOSCOPY N/A 09/01/2013  ?  Procedure: ESOPHAGOGASTRODUODENOSCOPY (EGD);  Surgeon: Beryle Beams, MD;  [facility] ENDOSCOPY;  Service: Endoscopy;  Laterality: N/A;  bedside  ? ESOPHAGOGASTRODUODENOSCOPY (EGD) WITH PROPOFOL N/A 05/26/2021  ? Procedure: ESOPHAGOGASTRODUODENOSCOPY (EGD) WITH PROPOFOL;  Surgeon: Carol Ada, MD;  Location: [place];  Service: Gastroenterology;  Laterality: N/A;  ? INCISION AND DRAINAGE Left 04/24/2021  ?  Procedure: INCISION AND DRAINAGE;  Surgeon: Landis Martins, DPM;  Location: [place];  Service: Podiatry;  Laterality: Left;  ? LEFT HEART CATH AND CORONARY ANGIOGRAPHY N/A 11/19/2020  ? Procedure: LEFT HEART CATH AND CORONARY ANGIOGRAPHY;  Surgeon: Nelva Bush, MD;  Location: [place] CV LAB;  Service: Cardiovascular;  Laterality: N/A;  ? LEFT HEART CATHETERIZATION WITH CORONARY ANGIOGRAM N/A 08/30/2013  ? Procedure: LEFT HEART CATHETERIZATION WITH CORONARY ANGIOGRAM;  Surgeon: Leonie Man, MD;  Location: [facility] CATH LAB: 100% mRCA (thrombus - extends to RPAV), 80% D1, 40% AVG Cx.  ? PERCUTANEOUS CORONARY STENT INTERVENTION (PCI-S)  08/30/2013  ? Procedure: PERCUTANEOUS CORONARY STENT INTERVENTION (PCI-S);  Surgeon: Leonie Man, MD;  Location: [facility] CATH LAB;  Integrity Resolute DES 2.0 mm x 38 mm -- 3.35 mm.; PTCA of proximal RPA V. - 3.0 mm x 15 mm balloon  ? PERIPHERAL VASCULAR ATHERECTOMY  05/26/2019  ? Procedure: PERIPHERAL VASCULAR ATHERECTOMY;  Surgeon: Marty Heck, MD;  Location: [place] CV LAB;  Service: Cardiovascular;;  Left SFA  ? PERIPHERAL VASCULAR BALLOON ANGIOPLASTY Left 07/11/2021  ? Procedure: PERIPHERAL VASCULAR BALLOON ANGIOPLASTY;  Surgeon: Marty Heck, MD;  Location: [place] CV LAB;  Service: Cardiovascular;  Laterality: Left;  PT TRUNK / AT  ? PERIPHERAL VASCULAR INTERVENTION  05/26/2019  ? Procedure: PERIPHERAL VASCULAR INTERVENTION;  Surgeon: Marty Heck, MD;  Location: [place] CV LAB;  Service: Cardiovascular;;  Left SFA  ? PERIPHERAL VASCULAR INTERVENTION Right 11/15/2020  ? Procedure: PERIPHERAL VASCULAR INTERVENTION;  Surgeon: Marty Heck, MD;  Location: [place] CV LAB;  Service: Cardiovascular;  Laterality: Right;  Superficial Femoral Artery  ? PERIPHERAL VASCULAR INTERVENTION Left 07/11/2021  ? Procedure: PERIPHERAL VASCULAR INTERVENTION;  Surgeon: Marty Heck, MD;  Location: [place] CV LAB;  Service: Cardiovascular;   Laterality: Left;  SFA  ? PITUITARY SURGERY    ? TEE WITHOUT CARDIOVERSION N/A 04/26/2021  ? Procedure: TRANSESOPHAGEAL ECHOCARDIOGRAM (TEE);  Surgeon: Buford Dresser, MD;  Location: [facility] ENDOSCOPY;  Service: Cardiovascular;  Laterality: N/A;  ? TRANSTHORACIC ECHOCARDIOGRAM  08/30/2013  ? mild LVH. EF 50-55%. Moderate HK of the entire inferior myocardium. GR 1 DD. Mild LA dilation. Mildly reduced RV function  ? TYMPANOMASTOIDECTOMY Right 12/28/2017  ? Procedure: RIGHT TYMPANOMASTOIDECTOMY;  Surgeon: Leta Baptist, MD;  Location: [place];  Service: ENT;  Laterality: Right;  ? ? ? ?  Latest Ref Rng & Units 07/12/2021  ?  1:18 AM 07/11/2021  ?  6:28 AM 07/10/2021  ?  3:15 AM  ?CBC  ?WBC 4.0 - 10.5 K/uL 8.0   6.2   5.4    ?Hemoglobin 12.0 - 15.0 g/dL 8.8   9.2   8.9    ?Hematocrit 36.0 - 46.0 % 28.7   30.3   28.8    ?Platelets 150 - 400 K/uL 425   472   453    ? ? ? ?  Latest Ref Rng & Units 07/12/2021  ?  1:18 AM 07/11/2021  ?  6:28 AM 07/10/2021  ?  3:15 AM  ?BMP  ?Glucose 70 - 99 mg/dL 79  137   156    ?BUN 8 - 23 mg/dL '6   8   8    '$ ?Creatinine 0.44 - 1.00 mg/dL 0.75   0.85   0.82    ?Sodium 135 - 145 mmol/L 137   142   137    ?Potassium 3.5 - 5.1 mmol/L 3.6   3.7   3.9    ?Chloride 98 - 111 mmol/L 108   111   108    ?CO2 22 - 32 mmol/L '25   27   23    '$ ?Calcium 8.9 - 10.3 mg/dL 7.8   8.0   7.9    ? ? ? ?Objective:  ? ?Vitals:  ? 07/12/21 1223 07/12/21 1608  ?BP: (!) 134/52 129/60  ?Pulse: 86 89  ?Resp: 20 20  ?Temp: 98 ?F (36.7 ?C) 98.5 ?F (36.9 ?C)  ?SpO2: 100% 99%  ? ? ?General:AA&O x 3. Normal mood and affect  ? ?Vascular: DP and PT pulses 2/4 bilateral. Brisk capillary refill to all digits. Pedal hair present  ? ?Neruological. Epicritic sensation grossly intact.  ? ?Derm: Left ankle wound with fibrotic base. No surrounding erythema or edema. Appears stable.  ? ?MSK: MMT 5/5 in dorsiflexion, plantar flexion, inversion and eversion. Normal joint ROM without pain or crepitus.  ? ?MRI left ankle   ?IMPRESSION ?1. New osteomyelitis of the cuboid, talar head, and navicular. ?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 ?talonavicular septic arthritis. ?2. Progressive diffuse severe soft t

## 2021-07-12 NOTE — Progress Notes (Signed)
Patient ID: Tammy Good, female   DOB: 05/10/1950, 71 y.o.   MRN: [id number] ?Patient is status post endovascular revascularization to the left lower extremity.  Her foot is warm.  There is no fluctuance there is no cellulitis she has a superficial granulating wound over the lateral ankle.  Review of the MRI scan shows chronic osteomyelitis involving the calcaneus cuboid talar head and navicular.  Discussed with patient treatment options with transtibial amputation versus prolonged antibiotics.  Discussed that it would be unknown if the antibiotics could clear the infection.  Patient states she would like to proceed with IV antibiotics.  I will follow-up in the office in 1 week. ?

## 2021-07-12 NOTE — Consult Note (Signed)
?   ? ? ?[place] for Infectious Disease   ? ?Date of Admission:  07/07/2021   Total days of inpatient antibiotics 3 ? ?      ?Reason for Consult: Osteomyelitis    ?Principal Problem: ?  Osteomyelitis of left foot ([place]) ?Active Problems: ?  Panhypopituitarism ([place]) ?  Diabetes mellitus, type 2 ([place]) ?  Essential hypertension ?  PAD (peripheral artery disease) ([place]) ? ? ?Assessment: ?71 year old female with history of MRSA bacteremia secondary to left foot infection/osteomyelitis admitted for worsening left foot infection.  She was placed on linezolid 4/13 with podiatry due to worsening wound. ? ? ?#New osteomyelitis of cuboid, talar head, navicular bone, #Progressive osteomyelitis of calcaneus ?#Calcaneocuboid and talonavicular septic arthritis ?#Vertigo associated with Linezolid ?-ABIs showed moderate left lower extremity arterial disease.   ?-Vascular surgery consulted and plan on aortogram with left leg runoff on 4/20.   ?-Orthopedics consulted and awaiting vascular intervention prior to any additional intervention. ? ?#Hx of MRSA bacteremia likely 2/2 foot abscess/osteomyelitis ?-Admitted 2/1-2/4 with MRSA bacteremia, MRI showed increased marrow edema distal fibula concerning for osteomyelitis. Underwent debridement with podiatry and bone Bx with wounds Cx of abscess+ MRSA. Bone bx path showed fibrosis + slight inflammation without necrotic bone.  Pt was treated with 2 weeks of IV vanc->2 week of PO linezolid. Seen in ID clinic on 3/3, noted to have severe vertigo may have been associated with linezolid. Plan was to monitor pt off antibiotics.  ? ?Management: ?-Given pt presented with fever, leukocytosis with Hx of recent MRSA wound infection and worsening MRI findings I think we should cover for osteomyelitis/infection as findings worsened off of antibiotics.   ? ? ?Recommendations:  ?-D/C cefepime and metronidazole ?-Start ceftriaxone ?-Continue vancomycin ? ? ?Microbiology:   ?Antibiotics: ?Vancomycin  4/16-p ?Metronidazole 4/16-p ?Cefepime 4/16-p ?Cultures: ?Blood ?4/15 NG ? ?HPI: Tammy Good is a 71 y.o. female with diabetes, hypertension, PAD, recent admission for MRSA bacteremia secondary to abscess and osteomyelitis status post 4 weeks of antibiotics(Vanc x 2wks then linezolidx2wks) admitted with sepsis secondary to left foot wound.  Patient had signs of inflammation of foot and started on linezolid by podiatry on 4/13.  She developed nausea and vomiting and presented to the ED.  On arrival to ED patient had a temp 100.9 and.  WBC 11 K.  MRI ankle showed new osteomyelitis of cuboid, talar head, navicular bone, progressive osteomyelitis of calcaneus, new calcaneocuboid and talonavicular septic arthritis, progressive diffuse severe soft tissue swelling of the ankle.  ABIs showed moderate left lower extremity arterial disease.  Vascular surgery consulted and plan on aortogram with left leg runoff on 4/20.  Orthopedics consulted and awaiting vascular intervention prior to any additional intervention. ? ?Review of Systems: ?Review of Systems  ?All other systems reviewed and are negative. ? ?Past Medical History:  ?Diagnosis Date  ? CAD S/P percutaneous coronary angioplasty 08/2013  ? 100% mRCA - PCI Integrity Resolute DES 3.0 mm x 38 mm - 3.35 mm; PTCA of RPA V 2.0 mm x 15 mm  ? Cholesteatoma   ? right  ? Diabetes mellitus type 2 in obese [facility])   ? On insulin and Invokana  ? History of osteomyelitis L 5th Toe all 05/2019  ? s/p Partial Ray Amputation with partial closure; 6 wks Abx & LSFA Atherectomy/DEB PTA with Stent for focal dissection.  ? Hyperlipidemia with target LDL less than 70   ? Hypothyroidism (acquired)   ? Mild essential hypertension   ? Obesity (  BMI 30-39.9) 11/17/2013  ? PAD (peripheral artery disease) ([place]) 05/26/2019  ? 05/26/19: Abd AoGram- BLE runoff -> L SFA orbital atherectomy - PTA w/ DEB & Stent 6 x 40 Luttonix (for focal dissection) - patent Pop A with 3 V runoff. LEA Dopplers 01/03/2020:  RABI (prev) 0.68 (0.53)/ RTBI (prev) 0.58 (0.33); LABI (prev) 0.80 (0.64), LTBI (prev) 0.64 (0.51); R mSFA ~50-74%, L mSFA 50-74%. Patent Prox SFA stent < 49% stenosis  ? Panhypopituitarism ([place])   ? ST elevation myocardial infarction (STEMI) of inferior wall, subsequent episode of care [facility]) 08/2013  ? 80% branch of D1, 40% mid AV groove circumflex, 100% RCA with subacute thrombus -- thrombus extending into RPA V with 100% occlusion after initial angioplasty of mid RCA ;; Post MI ECHO 6/9/'15: EF 50-55%, mild LVH with moderate HK of inferior wall, Gr1 DD, mild LA dilation; mildly reduced RV function  ? ? ?Social History  ? ?Tobacco Use  ? Smoking status: Former  ? Smokeless tobacco: Never  ?Substance Use Topics  ? Alcohol use: No  ? Drug use: No  ? ? ?Family History  ?Problem Relation Age of Onset  ? Cancer Mother 34  ?     multiple myeloma  ? Heart attack Father 65  ? Cancer Sister   ? Alzheimer's disease Maternal Grandmother   ? ?Scheduled Meds: ? apixaban  5 mg Oral BID  ? atorvastatin  40 mg Oral q1800  ? Chlorhexidine Gluconate Cloth  6 each Topical Q0600  ? clopidogrel  75 mg Oral Daily  ? collagenase  1 application. Topical Daily  ? hydrocortisone  10 mg Oral Daily  ? hydrocortisone  5 mg Oral Daily  ? insulin aspart  0-20 Units Subcutaneous TID WC  ? insulin glargine-yfgn  30 Units Subcutaneous QHS  ? levothyroxine  125 mcg Oral QAC breakfast  ? midodrine  5 mg Oral BID WC  ? mupirocin ointment  1 application. Nasal BID  ? pantoprazole  40 mg Oral BID AC  ? polyethylene glycol  17 g Oral Daily  ? senna-docusate  1 tablet Oral Once  ? sodium chloride flush  3 mL Intravenous Q12H  ? ?Continuous Infusions: ? sodium chloride    ? cefTRIAXone (ROCEPHIN)  IV 2 g (07/12/21 0939)  ? vancomycin 1,000 mg (07/11/21 1544)  ? ?PRN Meds:.sodium chloride, acetaminophen, hydrALAZINE, labetalol, ondansetron (ZOFRAN) IV, sodium chloride flush ?Allergies  ?Allergen Reactions  ? Strawberry Extract Itching, Swelling and  Anaphylaxis  ?  Mouth swells and gets itchy  ? ? ?OBJECTIVE: ?Blood pressure (!) 113/57, pulse 67, temperature 97.7 ?F (36.5 ?C), temperature source Oral, resp. rate 18, height '5\' 7"'  (1.702 m), weight 86.9 kg, SpO2 97 %. ? ?Physical Exam ?Constitutional:   ?   Appearance: Normal appearance.  ?HENT:  ?   Head: Normocephalic and atraumatic.  ?   Right Ear: Tympanic membrane normal.  ?   Left Ear: Tympanic membrane normal.  ?   Nose: Nose normal.  ?   Mouth/Throat:  ?   Mouth: Mucous membranes are moist.  ?Eyes:  ?   Extraocular Movements: Extraocular movements intact.  ?   Conjunctiva/sclera: Conjunctivae normal.  ?   Pupils: Pupils are equal, round, and reactive to light.  ?Cardiovascular:  ?   Rate and Rhythm: Normal rate and regular rhythm.  ?   Heart sounds: No murmur heard. ?  No friction rub. No gallop.  ?Pulmonary:  ?   Effort: Pulmonary effort is normal.  ?  Breath sounds: Normal breath sounds.  ?Abdominal:  ?   General: Abdomen is flat.  ?   Palpations: Abdomen is soft.  ?Musculoskeletal:     ?   General: Normal range of motion.  ?   Comments: Left ankle wound with some drainage.   ?Skin: ?   General: Skin is warm and dry.  ?Neurological:  ?   General: No focal deficit present.  ?   Mental Status: She is alert and oriented to person, place, and time.  ?Psychiatric:     ?   Mood and Affect: Mood normal.  ? ? ?Lab Results ?Lab Results  ?Component Value Date  ? WBC 8.0 07/12/2021  ? HGB 8.8 (L) 07/12/2021  ? HCT 28.7 (L) 07/12/2021  ? MCV 86.7 07/12/2021  ? PLT 425 (H) 07/12/2021  ?  ?Lab Results  ?Component Value Date  ? CREATININE 0.75 07/12/2021  ? BUN 6 (L) 07/12/2021  ? NA 137 07/12/2021  ? K 3.6 07/12/2021  ? CL 108 07/12/2021  ? CO2 25 07/12/2021  ?  ?Lab Results  ?Component Value Date  ? ALT 11 07/07/2021  ? AST 17 07/07/2021  ? ALKPHOS 97 07/07/2021  ? BILITOT 0.9 07/07/2021  ?  ? ? ? ?Laurice Record, MD ?[facility] ?[facility] ?07/12/2021, 11:04 AM  ?

## 2021-07-12 NOTE — Discharge Instructions (Signed)
  [facility]  Discharge Instructions  Lower Extremity Angiogram; Angioplasty/Stenting  Please refer to the following instructions for your post-procedure care. Your surgeon or physician assistant will discuss any changes with you.  Activity  Avoid lifting more than 8 pounds (1 gallons of milk) for 5 days after your procedure. You may walk as much as you can tolerate. It's OK to drive after 72 hours.  Bathing/Showering  You may shower the day after your procedure. If you have a bandage, you may remove it at 24- 48 hours. Clean your incision site with mild soap and water. Pat the area dry with a clean towel.  Diet  Resume your pre-procedure diet. There are no special food restrictions following this procedure. All patients with peripheral vascular disease should follow a low fat/low cholesterol diet. In order to heal from your surgery, it is CRITICAL to get adequate nutrition. Your body requires vitamins, minerals, and protein. Vegetables are the best source of vitamins and minerals. Vegetables also provide the perfect balance of protein. Processed food has little nutritional value, so try to avoid this.  Medications  Resume taking all of your medications unless your doctor tells you not to. If your incision is causing pain, you may take over-the-counter pain relievers such as acetaminophen (Tylenol)  Follow Up  Follow up will be arranged at the time of your procedure. You may have an office visit scheduled or may be scheduled for surgery. Ask your surgeon if you have any questions.  Please call us immediately for any of the following conditions: .Severe or worsening pain your legs or feet at rest or with walking. .Increased pain, redness, drainage at your groin puncture site. .Fever of 101 degrees or higher. .If you have any mild or slow bleeding from your puncture site: lie down, apply firm constant pressure over the area with a piece of gauze or a  clean wash cloth for 30 minutes- no peeking!, call 911 right away if you are still bleeding after 30 minutes, or if the bleeding is heavy and unmanageable.  Reduce your risk factors of vascular disease:  . Stop smoking. If you would like help call [facility] at 1-800-QUIT-NOW (1-800-784-8669) or [place] at [phone]. . Manage your cholesterol . Maintain a desired weight . Control your diabetes . Keep your blood pressure down .  If you have any questions, please call the office at [phone] 

## 2021-07-12 NOTE — Progress Notes (Signed)
PHARMACIST LIPID MONITORING ? ? ?Tammy Good is a 71 y.o. female admitted on 07/07/2021.  Pharmacy has been consulted to optimize lipid-lowering therapy with the indication of secondary prevention for clinical ASCVD. ? ?Recent Labs: ? ?Lipid Panel (last 6 months):   ?Lab Results  ?Component Value Date  ? CHOL 80 07/12/2021  ? TRIG 106 07/12/2021  ? HDL 22 (L) 07/12/2021  ? CHOLHDL 3.6 07/12/2021  ? VLDL 21 07/12/2021  ? [place] 37 07/12/2021  ? ? ?Hepatic function panel (last 6 months):   ?Lab Results  ?Component Value Date  ? AST 17 07/07/2021  ? ALT 11 07/07/2021  ? ALKPHOS 97 07/07/2021  ? BILITOT 0.9 07/07/2021  ? ? ?SCr (since admission):   ?Serum creatinine: 0.75 mg/dL 07/12/21 0118 ?Estimated creatinine clearance: 75.1 mL/min ? ?Current therapy and lipid therapy tolerance ?Current lipid-lowering therapy: atorvastatin 40 mg ?Previous lipid-lowering therapies (if applicable):  ?Documented or reported allergies or intolerances to lipid-lowering therapies (if applicable):  ? ?Assessment:   ?LDL lower than goal on current statin therapy. ? ?Plan:   ? ?1.Statin intensity (high intensity recommended for all patients regardless of the LDL):  No statin changes. The patient is already on a high intensity statin. ? ?2.Add ezetimibe (if any one of the following):   Not indicated at this time. ? ?3.Refer to lipid clinic:   No ? ?4.Follow-up with:  Primary care provider - [name], MD ? ?5.Follow-up labs after discharge:  No changes in lipid therapy, repeat a lipid panel in one year.    ? ? ? ?Pat Patrick, PharmD ?07/12/2021, 1:50 PM ? ?

## 2021-07-13 DIAGNOSIS — M86272 Subacute osteomyelitis, left ankle and foot: Secondary | ICD-10-CM | POA: Diagnosis not present

## 2021-07-13 LAB — CBC
HCT: 30.4 % — ABNORMAL LOW (ref 36.0–46.0)
Hemoglobin: 9.5 g/dL — ABNORMAL LOW (ref 12.0–15.0)
MCH: 27.3 pg (ref 26.0–34.0)
MCHC: 31.3 g/dL (ref 30.0–36.0)
MCV: 87.4 fL (ref 80.0–100.0)
Platelets: 431 10*3/uL — ABNORMAL HIGH (ref 150–400)
RBC: 3.48 MIL/uL — ABNORMAL LOW (ref 3.87–5.11)
RDW: 19.4 % — ABNORMAL HIGH (ref 11.5–15.5)
WBC: 9.7 10*3/uL (ref 4.0–10.5)
nRBC: 0 % (ref 0.0–0.2)

## 2021-07-13 LAB — GLUCOSE, CAPILLARY
Glucose-Capillary: 121 mg/dL — ABNORMAL HIGH (ref 70–99)
Glucose-Capillary: 143 mg/dL — ABNORMAL HIGH (ref 70–99)
Glucose-Capillary: 171 mg/dL — ABNORMAL HIGH (ref 70–99)
Glucose-Capillary: 212 mg/dL — ABNORMAL HIGH (ref 70–99)
Glucose-Capillary: 100 mg/dL — ABNORMAL HIGH (ref 70–99)

## 2021-07-13 LAB — BASIC METABOLIC PANEL
Anion gap: 8 (ref 5–15)
BUN: 5 mg/dL — ABNORMAL LOW (ref 8–23)
CO2: 23 mmol/L (ref 22–32)
Calcium: 8 mg/dL — ABNORMAL LOW (ref 8.9–10.3)
Chloride: 106 mmol/L (ref 98–111)
Creatinine, Ser: 0.72 mg/dL (ref 0.44–1.00)
GFR, Estimated: >60 mL/min (ref >60)
Glucose, Bld: 111 mg/dL — ABNORMAL HIGH (ref 70–99)
Potassium: 3.9 mmol/L (ref 3.5–5.1)
Sodium: 137 mmol/L (ref 135–145)

## 2021-07-13 MED ORDER — HYDROCORTISONE 10 MG PO TABS
10.0000 mg | ORAL_TABLET | Freq: Every day | ORAL | Status: AC
  Administered 2021-07-13: 10 mg via ORAL
  Filled 2021-07-13: qty 1

## 2021-07-13 MED ORDER — HYDROCORTISONE 20 MG PO TABS
20.0000 mg | ORAL_TABLET | Freq: Every day | ORAL | Status: DC
  Administered 2021-07-14: 20 mg via ORAL
  Filled 2021-07-13: qty 1

## 2021-07-13 MED ORDER — POTASSIUM CHLORIDE CRYS ER 20 MEQ PO TBCR
20.0000 meq | EXTENDED_RELEASE_TABLET | Freq: Once | ORAL | Status: AC
  Administered 2021-07-13: 20 meq via ORAL
  Filled 2021-07-13: qty 1

## 2021-07-13 MED ORDER — HYDROCORTISONE 10 MG PO TABS
10.0000 mg | ORAL_TABLET | Freq: Every day | ORAL | Status: DC
  Administered 2021-07-13 – 2021-07-14 (×2): 10 mg via ORAL
  Filled 2021-07-13 (×2): qty 1

## 2021-07-13 MED ORDER — SENNOSIDES-DOCUSATE SODIUM 8.6-50 MG PO TABS
1.0000 | ORAL_TABLET | Freq: Once | ORAL | Status: AC
  Administered 2021-07-13: 1 via ORAL
  Filled 2021-07-13: qty 1

## 2021-07-13 MED ORDER — POTASSIUM CHLORIDE 20 MEQ PO PACK: 20.0000 meq | PACK | Freq: Once | ORAL | Status: DC

## 2021-07-13 NOTE — Progress Notes (Signed)
? ?HD#6 ?SUBJECTIVE:  ?Patient Summary: Tammy Good is a 71 y.o. 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  ? ?Overnight Events: low blood sugar yesterday afternoon at 43. Glargine was held for last two evenings. ? ?Interim History: Patient assessed at bedside this AM. She continues to have minimal appetite.  She reports that she was able to sit up in a chair a few hours yesterday.  Denies pain and left lower. No other concerns at this time. ? ?OBJECTIVE:  ?Vital Signs: ?Vitals:  ? 07/12/21 1608 07/12/21 2012 07/12/21 2358 07/13/21 0309  ?BP: 129/60 (!) 130/52 112/68 (!) 165/96  ?Pulse: 89 76 88 100  ?Resp: '20 17 19 19  '$ ?Temp: 98.5 ?F (36.9 ?C) 98.3 ?F (36.8 ?C) 98.2 ?F (36.8 ?C) 98.4 ?F (36.9 ?C)  ?TempSrc: Oral Oral Oral Oral  ?SpO2: 99% 97% 98% 100%  ?Weight:      ?Height:      ? ?Supplemental O2: Room Air ?SpO2: 100 % ?O2 Flow Rate (L/min): 2 L/min ? ?Filed Weights  ? 07/07/21 0630 07/07/21 0804 07/07/21 2107  ?Weight: 101 kg 86.2 kg 86.9 kg  ? ? ? ?Intake/Output Summary (Last 24 hours) at 07/13/2021 0653 ?Last data filed at 07/13/2021 0551 ?Gross per 24 hour  ?Intake 240 ml  ?Output 2250 ml  ?Net -2010 ml  ? ? ?Net IO Since Admission: -2,006.44 mL [07/13/21 0653] ? ?Physical Exam: ?Physical Exam ?HENT:  ?   Head: Normocephalic.  ?   Mouth/Throat:  ?   Mouth: Mucous membranes are moist.  ?Cardiovascular:  ?   Rate and Rhythm: Normal rate and regular rhythm.  ?   Pulses: Normal pulses.  ?   Heart sounds: Normal heart sounds.  ?Pulmonary:  ?   Effort: Pulmonary effort is normal.  ?   Breath sounds: Normal breath sounds.  ?Abdominal:  ?   General: Abdomen is flat. Bowel sounds are normal. There is no distension.  ?   Palpations: Abdomen is soft.  ?   Tenderness: There is no abdominal tenderness.  ?Skin: ?   General: Skin  is warm and dry.  ?   Comments: Wound on left side of lower leg with granulation tissue, no purulence of erythema  ?Neurological:  ?   General: No focal deficit present.  ?   Mental Status: She is alert and oriented to person, place, and time.  ?  ? ?Patient Lines/Drains/Airways Status   ? ? Active Line/Drains/Airways   ? ? Name Placement date Placement time Site Days  ? Peripheral IV 07/07/21 18 G Right Antecubital 07/07/21  0328  Antecubital  1  ? External Urinary Catheter 06/05/21  0000  --  33  ? External Urinary Catheter 07/07/21  2107  --  1  ? Incision (Closed) 05/25/19 Foot 05/25/19  1303  -- 775  ? Incision (Closed) 04/24/21 Ankle Left 04/24/21  2259  -- 75  ? Pressure Injury 05/31/21 Sacrum Mid;Lower Stage 2 -  Partial thickness loss of dermis presenting as a shallow open injury with a red, pink wound bed without slough. denuded area between buttock on sacrum-pink and peeling 05/31/21  0300  -- 38  ? Wound / Incision (Open or Dehisced) 04/23/21 Other (Comment) Ankle Left full thickness wound 04/23/21  --  Ankle  76  ? Wound / Incision (Open or Dehisced) 04/23/21 Other (  Comment) Foot Left full thickness wound 04/23/21  --  Foot  76  ? Wound / Incision (Open or Dehisced) 04/26/21 Skin tear Thigh Left;Posterior Pinched while on [facility] 04/26/21  2306  Thigh  73  ? ?  ?  ? ?  ? ? ?Pertinent Labs: ? ?  Latest Ref Rng & Units 07/13/2021  ?  1:14 AM 07/12/2021  ?  1:18 AM 07/11/2021  ?  6:28 AM  ?CBC  ?WBC 4.0 - 10.5 K/uL 9.7   8.0   6.2    ?Hemoglobin 12.0 - 15.0 g/dL 9.5   8.8   9.2    ?Hematocrit 36.0 - 46.0 % 30.4   28.7   30.3    ?Platelets 150 - 400 K/uL 431   425   472    ? ? ? ?  Latest Ref Rng & Units 07/13/2021  ?  1:14 AM 07/12/2021  ?  1:18 AM 07/11/2021  ?  6:28 AM  ?CMP  ?Glucose 70 - 99 mg/dL 111   79   137    ?BUN 8 - 23 mg/dL '5   6   8    '$ ?Creatinine 0.44 - 1.00 mg/dL 0.72   0.75   0.85    ?Sodium 135 - 145 mmol/L 137   137   142    ?Potassium 3.5 - 5.1 mmol/L 3.9   3.6   3.7    ?Chloride 98 - 111 mmol/L  106   108   111    ?CO2 22 - 32 mmol/L '23   25   27    '$ ?Calcium 8.9 - 10.3 mg/dL 8.0   7.8   8.0    ? ? ?Recent Labs  ?  07/12/21 ?2135 07/13/21 ?2542 07/13/21 ?0601  ?GLUCAP 89 121* 143*  ? ?  ? ?ASSESSMENT/PLAN:  ?Assessment: ? ?Principal Problem: ?  Osteomyelitis of left foot ([place]) ?Active Problems: ?  Panhypopituitarism ([place]) ?  Diabetes mellitus, type 2 ([place]) ?  Essential hypertension ?  PAD (peripheral artery disease) ([place]) ? ? ? ?Tammy Good is a 71 y.o. 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  ? 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  Podiatry spoke with patient yesterday.  She is strongly against BKA.  They agreed for I&D with bone biopsy on Monday, 4/24.  She will need prolonged course of antibiotics following this. She will need to be NPO at midnight for Monday and apixaban/ clopidogrel will be held starting tomorrow. ?-Vancomycin, day 7 ?-Ceftriaxone, day 7 ?-appreciate ID recommendations, plan for a prolonged course of antibiotics. ? ?#PAD status post left SFA stenting in 2021. ?Arteriogram completed 4/20, left SFA was treated with stenting and angioplasty. There was significant small vessel disease in the foot. Will hold clopidogrel stating 4/23 for procedure 4/24. ?- clopidogrel '75mg'$  daily ?- Atorvastatin '40mg'$  daily ? ?#Acquired panhypopituitarism ?Home medications include levothyroxine 125 mcg, midodrine '5mg'$  BID, and dexamethasone 0.5 qd. She is has had two hypoglycemic episodes in the last two days despite long acting insulin being held. This could be related to decreased appetite and need for increase in steroid dosing  with procedures and underlying infection. Will increase steroid dosing for the  weekend and day of surgery. ?-Hydrocortisone 20 mg in the morning and 10 mg at night ?-Synthroid 125 mcg ?-midodrine 5 mg ? ?#Insulin- dependent diabetes ?Medications include glargine 42 units and NovoLog sliding scale, Farxiga.  She is on chronic steroids. Low blood sugar at 43 4/21 at lunch. She states that she has a decreased appetite and has not been eating much. Steroid dosing has been increased. ?-Discontinued glargine with hypoglycemic episodes ?-SSI, resistant ? ?#Constipation ?Miralax and senna given. She had a bowel movement yesterday, but reports that she continues to feel constipated. ? ?#Thrombocytosis ?Platelets was elevated at 610 on admission. Likely reactive in setting of chronic infection. ? ?-Trend CBC ? ?#Normocytic anemia ?#History of Non-bleeding esophageal ulcer ?History of prior hospitalization due to GI bleed several months ago.  No signs of hemoptysis or melena.  Thinking this was likely hemodilution in the setting of hypovolemia with her recent vomiting episodes.  Medications include pantoprazole 40 mg twice daily. ? ?-Trend CBC ? ?#Paroxysmal A-fib ?Medications include apixaban will be held starting 4/23 for procedure on monday. ? ?Best Practice: ?Diet: carb modified ?IVF: Fluids: none ?VTE:  eliquis for atrial fibrillation ?Code: Full ?AB: Vancomycin, cefazolin day 7 ?Therapy Recs: Home Health, her daughter states that she already has a rolling walker at home ?Family Contact: unable to reach daughter by phone ?DISPO: Anticipated discharge in 2-3 days to Home pending I&D/ bone biopsy on 4/24. ? ?Signature: ?Daleen Bo. Nikoloz Huy, D.O.  ?Internal Medicine Resident, PGY-1 ?[facility]  ?Pager: [phone] ?6:53 AM, 07/13/2021  ? ?Please contact the on call pager after 5 pm and on weekends at [phone].  ?

## 2021-07-14 DIAGNOSIS — M86272 Subacute osteomyelitis, left ankle and foot: Secondary | ICD-10-CM | POA: Diagnosis not present

## 2021-07-14 LAB — BASIC METABOLIC PANEL
Anion gap: 6 (ref 5–15)
BUN: 5 mg/dL — ABNORMAL LOW (ref 8–23)
CO2: 28 mmol/L (ref 22–32)
Calcium: 8.3 mg/dL — ABNORMAL LOW (ref 8.9–10.3)
Chloride: 105 mmol/L (ref 98–111)
Creatinine, Ser: 0.72 mg/dL (ref 0.44–1.00)
GFR, Estimated: >60 mL/min (ref >60)
Glucose, Bld: 135 mg/dL — ABNORMAL HIGH (ref 70–99)
Potassium: 4.2 mmol/L (ref 3.5–5.1)
Sodium: 139 mmol/L (ref 135–145)

## 2021-07-14 LAB — GLUCOSE, CAPILLARY
Glucose-Capillary: 113 mg/dL — ABNORMAL HIGH (ref 70–99)
Glucose-Capillary: 142 mg/dL — ABNORMAL HIGH (ref 70–99)
Glucose-Capillary: 260 mg/dL — ABNORMAL HIGH (ref 70–99)
Glucose-Capillary: 245 mg/dL — ABNORMAL HIGH (ref 70–99)

## 2021-07-14 LAB — CBC
HCT: 28.3 % — ABNORMAL LOW (ref 36.0–46.0)
Hemoglobin: 8.6 g/dL — ABNORMAL LOW (ref 12.0–15.0)
MCH: 26.5 pg (ref 26.0–34.0)
MCHC: 30.4 g/dL (ref 30.0–36.0)
MCV: 87.3 fL (ref 80.0–100.0)
Platelets: 468 10*3/uL — ABNORMAL HIGH (ref 150–400)
RBC: 3.24 MIL/uL — ABNORMAL LOW (ref 3.87–5.11)
RDW: 19.7 % — ABNORMAL HIGH (ref 11.5–15.5)
WBC: 8.6 10*3/uL (ref 4.0–10.5)
nRBC: 0 % (ref 0.0–0.2)

## 2021-07-14 MED ORDER — CLOPIDOGREL BISULFATE 75 MG PO TABS
75.0000 mg | ORAL_TABLET | Freq: Every day | ORAL | Status: DC
  Administered 2021-07-14 – 2021-07-17 (×4): 75 mg via ORAL
  Filled 2021-07-14 (×3): qty 1

## 2021-07-14 MED ORDER — HYDROCORTISONE 10 MG PO TABS
10.0000 mg | ORAL_TABLET | Freq: Every day | ORAL | Status: DC
Start: 2021-07-15 — End: 2021-07-17
  Administered 2021-07-15 – 2021-07-16 (×2): 10 mg via ORAL
  Filled 2021-07-14 (×3): qty 1

## 2021-07-14 MED ORDER — HEPARIN SODIUM (PORCINE) 5000 UNIT/ML IJ SOLN
5000.0000 [IU] | Freq: Three times a day (TID) | INTRAMUSCULAR | Status: DC
  Administered 2021-07-14 – 2021-07-16 (×5): 5000 [IU] via SUBCUTANEOUS
  Filled 2021-07-14 (×6): qty 1

## 2021-07-14 MED ORDER — SENNOSIDES-DOCUSATE SODIUM 8.6-50 MG PO TABS
1.0000 | ORAL_TABLET | Freq: Once | ORAL | Status: AC
Start: 2021-07-14 — End: 2021-07-14
  Administered 2021-07-14: 1 via ORAL

## 2021-07-14 MED ORDER — HYDROCORTISONE 20 MG PO TABS
20.0000 mg | ORAL_TABLET | Freq: Every morning | ORAL | Status: DC
  Administered 2021-07-15 – 2021-07-17 (×3): 20 mg via ORAL
  Filled 2021-07-14 (×7): qty 1

## 2021-07-14 NOTE — Progress Notes (Signed)
Pharmacy Antibiotic Note ? ?Tammy Good is a 71 y.o. female admitted on 07/07/2021 osteomyelitis. 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 Pharmacy has been consulted for Vancomycin/ceftriaxone dosing. ? ?Plan for I&D and bone biopsy 4/24 followed by prolonged ABX.  ?Vancomycin dose will be adjusted based on pharmacokinetic calculations. Will continue to follow plans for antibiotic therapy. Scr remains stable at < 1, will continue current dose for now with plans for follow-up levels later in the week, post OR and changes per bone biopsy.  ? ?Vancomycin Peak 4/19 at 13:30 ?Vancomycin Trough 4/20 at 12:30 ?Calculated AUC on 1250 mg q24h is 669 with CL of 31.1 L/min ? ?Scr 0.72 ?Blood and urine cultures NGTD ? ? ?Plan: ?Continue Vancomycin to '1000mg'$  q24hr (AUC of 535.5) ?Continue Ceftriaxone 2gm q24hr ?Will monitor for acute changes in renal function and adjust as needed ?F/u cultures results and de-escalate as appropriate ? ?Height: '5\' 7"'$  (170.2 cm) ?Weight: 86.9 kg (191 lb 9.3 oz) ?IBW/kg (Calculated) : 61.6 ? ?Temp (24hrs), Avg:98.4 ?F (36.9 ?C), Min:98 ?F (36.7 ?C), Max:99.1 ?F (37.3 ?C) ? ?Recent Labs  ?Lab 07/10/21 ?0315 07/10/21 ?1330 07/11/21 ?0628 07/11/21 ?1230 07/12/21 ?0118 07/13/21 ?0114 07/14/21 ?0440  ?WBC 5.4  --  6.2  --  8.0 9.7 8.6  ?CREATININE 0.82  --  0.85  --  0.75 0.72 0.72  ?[place]  --   --   --  14*  --   --   --   ?VANCOPEAK  --  41*  --   --   --   --   --   ? ?  ?Estimated Creatinine Clearance: 75.1 mL/min (by C-G formula based on SCr of 0.72 mg/dL).   ? ?Allergies  ?Allergen Reactions  ? Strawberry Extract Itching, Swelling and Anaphylaxis  ?  Mouth swells and gets itchy  ? ? ?Thank you for allowing pharmacy to participate in this patient's care. ? ?Cathrine Muster, PharmD ?PGY2 Cardiology Pharmacy Resident ?Phone: [phone] ?07/14/2021  11:53 AM ? ?Please check [email or web address] for unit-specific pharmacy phone numbers. ? ? ? ? ?

## 2021-07-14 NOTE — Progress Notes (Signed)
? ?  S: Patient reports feeling well this morning.  Sensation of nausea and food aversion have improved today compared to involve prior 2 days.  Denies any abdominal pain.  Only been out of bed a little bit.  No pain in the left foot. ? ?O:  ?Today's Vitals  ? 07/13/21 1951 07/13/21 2028 07/13/21 2341 07/14/21 0315  ?BP:  (!) 142/62 (!) 139/59 (!) 145/66  ?Pulse:  85 84 85  ?Resp:  '17 15 14  '$ ?Temp:  98.1 ?F (36.7 ?C) 98.2 ?F (36.8 ?C) 98 ?F (36.7 ?C)  ?TempSrc:  Oral Oral Oral  ?SpO2:  97% 98% 99%  ?Weight:      ?Height:      ?PainSc: 0-No pain     ? ?Body mass index is 30.01 kg/m?. ? ?Gen: Well-appearing woman lying in bed, no distress ?Ext: Left foot is warm, well-perfused, under her bandage there is a granulated chronic healing wounds on the anterior ankle without purulence.  On her left arm there is an IV with a small amount of swelling underneath. ?Psych: Appropriate affect, not depressed or anxious appearing ? ?A/P: ? ?Principal Problem: ?  Osteomyelitis of left foot ([place]) ?Active Problems: ?  Panhypopituitarism ([place]) ?  Diabetes mellitus, type 2 ([place]) ?  Essential hypertension ?  PAD (peripheral artery disease) ([place]) ? ?Osteomyelitis of the left foot: On empiric antibiotic treatment with vancomycin and ceftriaxone for now.  Having some difficulty with PIV access, I anticipate we will need to place a PICC in the next 1-2 days for a prolonged course of IV antibiotics.  We will confirm this need with ID service on Monday.  Planning for operative debridement and bone biopsy tomorrow morning. ? ?Peripheral artery disease: Angiogram completed on 4/20 showing diffuse disease of the left superficial femoral artery that was managed with stent placements and balloon angioplasty.  We will continue medical therapy with Plavix 75 mg daily and atorvastatin 40 mg daily.  ? ?Adrenal insufficiency: Had some symptoms for last 2 days of food aversion and nausea which were probably coming from adrenal insufficiency.  We increased  her hydrocortisone to 20 mg in the morning and 10 mg in the evening and she has had good response.  We will keep this dose through the surgery tomorrow. ? ?Diabetes: Home dose of long-acting insulin is 42 units nightly.  We have held the long-acting insulin the last 3 nights because of several episodes of hypoglycemia.  She ate better yesterday, seems to be resolved with increasing the steroid dose.  Blood glucose today ranging from 113-142.  We will work to maintain blood sugars under 180 to optimize wound healing.  Continue to hold long-acting insulin for now given upcoming n.p.o. status.  Continue with sliding scale insulin only for today. ? ?FEN: Regular diet, n.p.o. after midnight ? ?DVT: Usually anticoagulated with apixaban for atrial fibrillation.  Holding apixaban today given surgical plans on Monday.  Will restart apixaban postoperatively.  3 times daily heparin [place] for today. ? ?We will anticipate discharge sometime early in the week after bone biopsy, and when home antibiotic plan can be finalized with ID service.  PT is recommending home health services which we can arrange. ? ?Axel Filler, MD ?07/14/2021, 12:52 PM ? ?

## 2021-07-15 ENCOUNTER — Inpatient Hospital Stay (HOSPITAL_COMMUNITY): Payer: Medicare Other

## 2021-07-15 ENCOUNTER — Encounter (HOSPITAL_COMMUNITY)
Admission: EM | Disposition: A | Discharge status: Home Health | Payer: Self-pay | Source: Home / Self Care | Attending: Student in an Organized Health Care Education/Training Program

## 2021-07-15 ENCOUNTER — Other Ambulatory Visit: Payer: Self-pay

## 2021-07-15 ENCOUNTER — Inpatient Hospital Stay (HOSPITAL_COMMUNITY): Payer: Medicare Other | Admitting: General Practice

## 2021-07-15 ENCOUNTER — Encounter (HOSPITAL_COMMUNITY): Payer: Self-pay | Admitting: Student in an Organized Health Care Education/Training Program

## 2021-07-15 DIAGNOSIS — I2510 Atherosclerotic heart disease of native coronary artery without angina pectoris: Secondary | ICD-10-CM

## 2021-07-15 DIAGNOSIS — E1169 Type 2 diabetes mellitus with other specified complication: Secondary | ICD-10-CM

## 2021-07-15 DIAGNOSIS — M009 Pyogenic arthritis, unspecified: Secondary | ICD-10-CM

## 2021-07-15 DIAGNOSIS — M86672 Other chronic osteomyelitis, left ankle and foot: Secondary | ICD-10-CM

## 2021-07-15 DIAGNOSIS — M86272 Subacute osteomyelitis, left ankle and foot: Secondary | ICD-10-CM | POA: Diagnosis not present

## 2021-07-15 DIAGNOSIS — M869 Osteomyelitis, unspecified: Secondary | ICD-10-CM

## 2021-07-15 HISTORY — PX: BONE BIOPSY: SHX375

## 2021-07-15 HISTORY — PX: IRRIGATION AND DEBRIDEMENT FOOT: SHX6602

## 2021-07-15 LAB — GLUCOSE, CAPILLARY
Glucose-Capillary: 104 mg/dL — ABNORMAL HIGH (ref 70–99)
Glucose-Capillary: 130 mg/dL — ABNORMAL HIGH (ref 70–99)
Glucose-Capillary: 187 mg/dL — ABNORMAL HIGH (ref 70–99)
Glucose-Capillary: 143 mg/dL — ABNORMAL HIGH (ref 70–99)
Glucose-Capillary: 135 mg/dL — ABNORMAL HIGH (ref 70–99)

## 2021-07-15 LAB — BASIC METABOLIC PANEL
Anion gap: 7 (ref 5–15)
BUN: <5 mg/dL — ABNORMAL LOW (ref 8–23)
CO2: 28 mmol/L (ref 22–32)
Calcium: 8.2 mg/dL — ABNORMAL LOW (ref 8.9–10.3)
Chloride: 103 mmol/L (ref 98–111)
Creatinine, Ser: 0.75 mg/dL (ref 0.44–1.00)
GFR, Estimated: >60 mL/min (ref >60)
Glucose, Bld: 138 mg/dL — ABNORMAL HIGH (ref 70–99)
Potassium: 3.7 mmol/L (ref 3.5–5.1)
Sodium: 138 mmol/L (ref 135–145)

## 2021-07-15 LAB — CBC
HCT: 28.4 % — ABNORMAL LOW (ref 36.0–46.0)
Hemoglobin: 8.6 g/dL — ABNORMAL LOW (ref 12.0–15.0)
MCH: 26.5 pg (ref 26.0–34.0)
MCHC: 30.3 g/dL (ref 30.0–36.0)
MCV: 87.7 fL (ref 80.0–100.0)
Platelets: 450 10*3/uL — ABNORMAL HIGH (ref 150–400)
RBC: 3.24 MIL/uL — ABNORMAL LOW (ref 3.87–5.11)
RDW: 19.9 % — ABNORMAL HIGH (ref 11.5–15.5)
WBC: 7.2 10*3/uL (ref 4.0–10.5)
nRBC: 0 % (ref 0.0–0.2)

## 2021-07-15 LAB — MAGNESIUM: Magnesium: 1.8 mg/dL (ref 1.7–2.4)

## 2021-07-15 LAB — SURGICAL PCR SCREEN
MRSA, PCR: POSITIVE — AB
Staphylococcus aureus: POSITIVE — AB

## 2021-07-15 SURGERY — BIOPSY, BONE
Anesthesia: Monitor Anesthesia Care | Site: Foot | Laterality: Left

## 2021-07-15 MED ORDER — LACTATED RINGERS IV SOLN: INTRAVENOUS | Status: DC

## 2021-07-15 MED ORDER — 0.9 % SODIUM CHLORIDE (POUR BTL) OPTIME
TOPICAL | Status: DC | PRN
  Administered 2021-07-15: 1000 mL

## 2021-07-15 MED ORDER — OXYCODONE HCL 5 MG/5ML PO SOLN: 5.0000 mg | Freq: Once | ORAL | Status: DC | PRN

## 2021-07-15 MED ORDER — FENTANYL CITRATE (PF) 250 MCG/5ML IJ SOLN
INTRAMUSCULAR | Status: AC
  Filled 2021-07-15: qty 5

## 2021-07-15 MED ORDER — MAGNESIUM SULFATE 2 GM/50ML IV SOLN
2.0000 g | Freq: Once | INTRAVENOUS | Status: AC
  Administered 2021-07-15: 2 g via INTRAVENOUS
  Filled 2021-07-15: qty 50

## 2021-07-15 MED ORDER — BUPIVACAINE HCL (PF) 0.25 % IJ SOLN
INTRAMUSCULAR | Status: AC
  Filled 2021-07-15: qty 30

## 2021-07-15 MED ORDER — FENTANYL CITRATE (PF) 100 MCG/2ML IJ SOLN: 25.0000 ug | INTRAMUSCULAR | Status: DC | PRN

## 2021-07-15 MED ORDER — MIDODRINE HCL 5 MG PO TABS
2.5000 mg | ORAL_TABLET | Freq: Two times a day (BID) | ORAL | Status: DC
  Administered 2021-07-15 – 2021-07-17 (×6): 2.5 mg via ORAL
  Filled 2021-07-15 (×6): qty 1

## 2021-07-15 MED ORDER — FENTANYL CITRATE (PF) 250 MCG/5ML IJ SOLN
INTRAMUSCULAR | Status: DC | PRN
  Administered 2021-07-15 (×2): 25 ug via INTRAVENOUS

## 2021-07-15 MED ORDER — LIDOCAINE 2% (20 MG/ML) 5 ML SYRINGE
INTRAMUSCULAR | Status: DC | PRN
  Administered 2021-07-15: 60 mg via INTRAVENOUS

## 2021-07-15 MED ORDER — OXYCODONE HCL 5 MG PO TABS: 5.0000 mg | ORAL_TABLET | Freq: Once | ORAL | Status: DC | PRN

## 2021-07-15 MED ORDER — PROPOFOL 10 MG/ML IV BOLUS
INTRAVENOUS | Status: DC | PRN
  Administered 2021-07-15: 20 mg via INTRAVENOUS

## 2021-07-15 MED ORDER — PHENYLEPHRINE 80 MCG/ML (10ML) SYRINGE FOR IV PUSH (FOR BLOOD PRESSURE SUPPORT)
PREFILLED_SYRINGE | INTRAVENOUS | Status: DC | PRN
  Administered 2021-07-15 (×6): 80 ug via INTRAVENOUS

## 2021-07-15 MED ORDER — LIDOCAINE HCL (PF) 1 % IJ SOLN
INTRAMUSCULAR | Status: AC
  Filled 2021-07-15: qty 30

## 2021-07-15 MED ORDER — BUPIVACAINE HCL 0.25 % IJ SOLN
INTRAMUSCULAR | Status: DC | PRN
  Administered 2021-07-15: 20 mL via INTRAMUSCULAR
  Administered 2021-07-15: 5 mL via INTRAMUSCULAR

## 2021-07-15 MED ORDER — ONDANSETRON HCL 4 MG/2ML IJ SOLN
INTRAMUSCULAR | Status: DC | PRN
  Administered 2021-07-15: 4 mg via INTRAVENOUS

## 2021-07-15 MED ORDER — PROPOFOL 500 MG/50ML IV EMUL
INTRAVENOUS | Status: DC | PRN
  Administered 2021-07-15: 100 ug/kg/min via INTRAVENOUS

## 2021-07-15 MED ORDER — INSULIN ASPART 100 UNIT/ML IJ SOLN: 0.0000 [IU] | INTRAMUSCULAR | Status: DC | PRN

## 2021-07-15 MED ORDER — ONDANSETRON HCL 4 MG/2ML IJ SOLN: 4.0000 mg | Freq: Four times a day (QID) | INTRAMUSCULAR | Status: DC | PRN

## 2021-07-15 MED ORDER — POTASSIUM CHLORIDE CRYS ER 10 MEQ PO TBCR
30.0000 meq | EXTENDED_RELEASE_TABLET | Freq: Once | ORAL | Status: DC
  Filled 2021-07-15: qty 3

## 2021-07-15 MED ORDER — MUPIROCIN 2 % EX OINT
1.0000 "application " | TOPICAL_OINTMENT | Freq: Two times a day (BID) | CUTANEOUS | Status: DC
  Administered 2021-07-15 – 2021-07-17 (×4): 1 via NASAL
  Filled 2021-07-15 (×2): qty 22

## 2021-07-15 MED ORDER — ORAL CARE MOUTH RINSE: 15.0000 mL | Freq: Once | OROMUCOSAL | Status: AC

## 2021-07-15 MED ORDER — CHLORHEXIDINE GLUCONATE 0.12 % MT SOLN: 15.0000 mL | Freq: Once | OROMUCOSAL | Status: AC

## 2021-07-15 MED ORDER — CHLORHEXIDINE GLUCONATE 0.12 % MT SOLN
OROMUCOSAL | Status: AC
  Administered 2021-07-15: 15 mL via OROMUCOSAL
  Filled 2021-07-15: qty 15

## 2021-07-15 SURGICAL SUPPLY — 39 items
BAG COUNTER SPONGE SURGICOUNT (BAG) ×3 IMPLANT
BAG SPNG CNTER NS LX DISP (BAG) ×1
BNDG ELASTIC 4X5.8 VLCR STR LF (GAUZE/BANDAGES/DRESSINGS) ×1 IMPLANT
BNDG GAUZE ELAST 4 BULKY (GAUZE/BANDAGES/DRESSINGS) ×1 IMPLANT
CNTNR URN SCR LID CUP LEK RST (MISCELLANEOUS) IMPLANT
CONT SPEC 4OZ STRL OR WHT (MISCELLANEOUS) ×12
COVER SURGICAL LIGHT HANDLE (MISCELLANEOUS) ×3 IMPLANT
DRAPE U-SHAPE 47X51 STRL (DRAPES) ×3 IMPLANT
DRSG PAD ABDOMINAL 8X10 ST (GAUZE/BANDAGES/DRESSINGS) ×1 IMPLANT
ELECT CAUTERY BLADE 6.4 (BLADE) ×3 IMPLANT
ELECT REM PT RETURN 9FT ADLT (ELECTROSURGICAL) ×2
ELECTRODE REM PT RTRN 9FT ADLT (ELECTROSURGICAL) ×2 IMPLANT
GAUZE SPONGE 4X4 12PLY STRL (GAUZE/BANDAGES/DRESSINGS) ×1 IMPLANT
GAUZE XEROFORM 5X9 LF (GAUZE/BANDAGES/DRESSINGS) ×1 IMPLANT
GLOVE BIO SURGEON STRL SZ7.5 (GLOVE) ×3 IMPLANT
GLOVE BIOGEL PI IND STRL 8 (GLOVE) ×2 IMPLANT
GLOVE BIOGEL PI INDICATOR 8 (GLOVE) ×1
GLOVE SURG UNDER POLY LF SZ7.5 (GLOVE) ×3 IMPLANT
GOWN STRL REUS W/ TWL LRG LVL3 (GOWN DISPOSABLE) ×4 IMPLANT
GOWN STRL REUS W/TWL LRG LVL3 (GOWN DISPOSABLE) ×4
KIT BASIN OR (CUSTOM PROCEDURE TRAY) ×3 IMPLANT
KIT TURNOVER KIT B (KITS) ×3 IMPLANT
MANIFOLD NEPTUNE II (INSTRUMENTS) ×3 IMPLANT
NDL BIOPSY JAMSHIDI 8X6 (NEEDLE) IMPLANT
NDL HYPO 25GX1X1/2 BEV (NEEDLE) IMPLANT
NEEDLE BIOPSY JAMSHIDI 8X6 (NEEDLE) ×2 IMPLANT
NEEDLE HYPO 25GX1X1/2 BEV (NEEDLE) ×4 IMPLANT
NS IRRIG 1000ML POUR BTL (IV SOLUTION) ×3 IMPLANT
PACK ORTHO EXTREMITY (CUSTOM PROCEDURE TRAY) ×3 IMPLANT
PAD ARMBOARD 7.5X6 YLW CONV (MISCELLANEOUS) ×6 IMPLANT
SET CYSTO W/LG BORE CLAMP LF (SET/KITS/TRAYS/PACK) ×3 IMPLANT
SOL PREP POV-IOD 4OZ 10% (MISCELLANEOUS) ×6 IMPLANT
STAPLER VISISTAT 35W (STAPLE) ×1 IMPLANT
SUT ETHILON 3 0 FSL (SUTURE) ×5 IMPLANT
SYR CONTROL 10ML LL (SYRINGE) IMPLANT
TOWEL GREEN STERILE (TOWEL DISPOSABLE) ×3 IMPLANT
TOWEL GREEN STERILE FF (TOWEL DISPOSABLE) ×3 IMPLANT
TUBE CONNECTING 12X1/4 (SUCTIONS) ×3 IMPLANT
YANKAUER SUCT BULB TIP NO VENT (SUCTIONS) ×3 IMPLANT

## 2021-07-15 NOTE — Anesthesia Preprocedure Evaluation (Signed)
Anesthesia Evaluation  ?Patient identified by MRN, date of birth, ID band ?Patient awake ? ? ? ?Reviewed: ?Allergy & Precautions, H&P , NPO status , Patient's Chart, lab work & pertinent test results ? ?Airway ?Mallampati: II ? ? ?Neck ROM: full ? ? ? Dental ?  ?Pulmonary ?former smoker,  ?  ?breath sounds clear to auscultation ? ? ? ? ? ? Cardiovascular ?hypertension, + CAD, + Past MI, + Cardiac Stents and + Peripheral Vascular Disease  ? ?Rhythm:regular Rate:Normal ? ? ?  ?Neuro/Psych ?  ? GI/Hepatic ?PUD,   ?Endo/Other  ?diabetes, Type 2Hypothyroidism  ? Renal/GU ?  ? ?  ?Musculoskeletal ? ? Abdominal ?  ?Peds ? Hematology ? ?(+) Blood dyscrasia, anemia ,   ?Anesthesia Other Findings ? ? Reproductive/Obstetrics ? ?  ? ? ? ? ? ? ? ? ? ? ? ? ? ?  ?  ? ? ? ? ? ? ? ? ?Anesthesia Physical ?Anesthesia Plan ? ?ASA: 3 ? ?Anesthesia Plan: MAC  ? ?Post-op Pain Management:   ? ?Induction: Intravenous ? ?PONV Risk Score and Plan: 2 and Propofol infusion and Treatment may vary due to age or medical condition ? ?Airway Management Planned: Simple Face Mask ? ?Additional Equipment:  ? ?Intra-op Plan:  ? ?Post-operative Plan:  ? ?Informed Consent: I have reviewed the patients History and Physical, chart, labs and discussed the procedure including the risks, benefits and alternatives for the proposed anesthesia with the patient or authorized representative who has indicated his/her understanding and acceptance.  ? ? ? ?Dental advisory given ? ?Plan Discussed with: CRNA, Anesthesiologist and Surgeon ? ?Anesthesia Plan Comments:   ? ? ? ? ? ? ?Anesthesia Quick Evaluation ? ?

## 2021-07-15 NOTE — Anesthesia Procedure Notes (Signed)
Procedure Name: [place] ?Date/Time: 07/15/2021 12:07 PM ?Performed by: Dorann Lodge, CRNA ?Pre-anesthesia Checklist: Emergency Drugs available, Patient identified, Suction available, Patient being monitored and Timeout performed ?Patient Re-evaluated:Patient Re-evaluated prior to induction ?Oxygen Delivery Method: Simple face mask ? ? ? ? ?

## 2021-07-15 NOTE — Progress Notes (Addendum)
? ?HD#8 ?SUBJECTIVE:  ?Patient Summary: Tammy Good is a 71 y.o. 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  ? ?Overnight Events: None ? ?Interim History: Patient assessed at bedside this AM.  She was able to walk down the hallway with physical therapy.  She states that she continues to feel well.  Her appetite was better thanyesterday and she has not had any more episodes of nausea.  No other concerns at this time. ? ?OBJECTIVE:  ?Vital Signs: ?Vitals:  ? 07/15/21 1305 07/15/21 1315 07/15/21 1330 07/15/21 1411  ?BP: 134/65 133/65 (!) 125/57 (!) 142/57  ?Pulse: 92 87 96 89  ?Resp: 17 15 (!) 25 14  ?Temp:   98.4 ?F (36.9 ?C) 98.2 ?F (36.8 ?C)  ?TempSrc:    Oral  ?SpO2: 92% 93% 93% 99%  ?Weight:      ?Height:      ? ?Supplemental O2: Room Air ?SpO2: 99 % ?O2 Flow Rate (L/min): 2 L/min ? ?Filed Weights  ? 07/07/21 2107 07/15/21 0700 07/15/21 1100  ?Weight: 86.9 kg 86.9 kg 86.9 kg  ? ? ? ?Intake/Output Summary (Last 24 hours) at 07/15/2021 1539 ?Last data filed at 07/15/2021 1503 ?Gross per 24 hour  ?Intake 1755.8 ml  ?Output 20 ml  ?Net 1735.8 ml  ? ?Net IO Since Admission: -2,750.64 mL [07/15/21 1539] ? ?Physical Exam: ?Physical Exam ?HENT:  ?   Head: Normocephalic.  ?   Mouth/Throat:  ?   Mouth: Mucous membranes are moist.  ?Cardiovascular:  ?   Rate and Rhythm: Normal rate and regular rhythm.  ?   Pulses: Normal pulses.  ?   Heart sounds: Normal heart sounds.  ?Pulmonary:  ?   Effort: Pulmonary effort is normal.  ?   Breath sounds: Normal breath sounds.  ?Abdominal:  ?   General: Abdomen is flat. Bowel sounds are normal. There is no distension.  ?   Palpations: Abdomen is soft.  ?   Tenderness: There is no abdominal tenderness.  ?Skin: ?   General: Skin is warm and dry.  ?   Comments: Wound on left side of lower leg with  granulation tissue, no purulence or erythema  ?Neurological:  ?   General: No focal deficit present.  ?   Mental Status: She is alert and oriented to person, place, and time.  ?  ? ?Patient Lines/Drains/Airways Status   ? ? Active Line/Drains/Airways   ? ? Name Placement date Placement time Site Days  ? Peripheral IV 07/07/21 18 G Right Antecubital 07/07/21  0328  Antecubital  1  ? External Urinary Catheter 06/05/21  0000  --  33  ? External Urinary Catheter 07/07/21  2107  --  1  ? Incision (Closed) 05/25/19 Foot 05/25/19  1303  -- 775  ? Incision (Closed) 04/24/21 Ankle Left 04/24/21  2259  -- 75  ? Pressure Injury 05/31/21 Sacrum Mid;Lower Stage 2 -  Partial thickness loss of dermis presenting as a shallow open injury with a red, pink wound bed without slough. denuded area between buttock on sacrum-pink and peeling 05/31/21  0300  -- 38  ? Wound / Incision (Open or Dehisced) 04/23/21 Other (Comment) Ankle Left full thickness wound 04/23/21  --  Ankle  76  ? Wound / Incision (Open or Dehisced) 04/23/21 Other (Comment) Foot Left full thickness wound 04/23/21  --  Foot  76  ? Wound / Incision (Open or Dehisced) 04/26/21 Skin tear Thigh Left;Posterior Pinched while on [facility] 04/26/21  2306  Thigh  73  ? ?  ?  ? ?  ? ? ?Pertinent Labs: ? ?  Latest Ref Rng & Units 07/15/2021  ?  2:06 AM 07/14/2021  ?  4:40 AM 07/13/2021  ?  1:14 AM  ?CBC  ?WBC 4.0 - 10.5 K/uL 7.2   8.6   9.7    ?Hemoglobin 12.0 - 15.0 g/dL 8.6   8.6   9.5    ?Hematocrit 36.0 - 46.0 % 28.4   28.3   30.4    ?Platelets 150 - 400 K/uL 450   468   431    ? ? ? ?  Latest Ref Rng & Units 07/15/2021  ?  2:06 AM 07/14/2021  ?  4:40 AM 07/13/2021  ?  1:14 AM  ?CMP  ?Glucose 70 - 99 mg/dL 138   135   111    ?BUN 8 - 23 mg/dL '5   5   5    '$ ?Creatinine 0.44 - 1.00 mg/dL 0.75   0.72   0.72    ?Sodium 135 - 145 mmol/L 138   139   137    ?Potassium 3.5 - 5.1 mmol/L 3.7   4.2   3.9    ?Chloride 98 - 111 mmol/L 103   105   106    ?CO2 22 - 32 mmol/L '28   28   23    '$ ?Calcium 8.9  - 10.3 mg/dL 8.2   8.3   8.0    ? ? ?Recent Labs  ?  07/15/21 ?6789 07/15/21 ?1032 07/15/21 ?1301  ?GLUCAP 104* 130* 187*  ?  ? ?ASSESSMENT/PLAN:  ?Assessment: ? ?Principal Problem: ?  Osteomyelitis of left foot ([place]) ?Active Problems: ?  Panhypopituitarism ([place]) ?  Diabetes mellitus, type 2 ([place]) ?  Essential hypertension ?  PAD (peripheral artery disease) ([place]) ? ? ? ?Tammy Good is a 71 y.o. 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  ? 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  No purulence noted.  Patient remains afebrile with no leukocytosis.  Multiple cultures were sent following biopsy.  ID following with recommendations to continue vancomycin and ceftriaxone pending growth from cultures.  Patient remains at high risk for needing BKA. ?-Vancomycin, day 1 ?-Ceftriaxone, day 1 ?-appreciate ID recommendations, plan for a prolonged course of antibiotics pending culture growth. ? ?#PAD status post left SFA stenting in 2021. ?Arteriogram completed 4/20, left SFA was treated with stenting and angioplasty. There was significant small vessel disease in the foot.  ?- clopidogrel '75mg'$  daily ?- Atorvastatin '40mg'$  daily ? ?#Acquired panhypopituitarism ?Home medications include levothyroxine 125 mcg, midodrine '5mg'$  BID, and dexamethasone 0.5 qd. She is has had two hypoglycemic episodes in the last two days despite long acting insulin being held. This could be related to decreased appetite and need for increase in steroid dosing with procedures and underlying infection. Will increase steroid dosing for the weekend and day of surgery. ?-Hydrocortisone 20 mg  in the morning and 10 mg at night ?-Synthroid 125 mcg ?-midodrine 2.5 mg ? ?#Insulin-  dependent diabetes ?Medications include glargine 42 units and NovoLog sliding scale, Farxiga.  Plan to do a stress dosing at this time.  Blood sugars have remained controlled despite being off of long-acting insulin. ?-Discontinued glargine with hypoglycemic episodes ?-SSI, resistant ? ?#Constipation ?Miralax and senna given. She had a bowel movement yesterday, but reports that she continues to feel constipated. ? ?#Thrombocytosis ?Platelets was elevated at 610 on admission. Likely reactive in setting of chronic infection. ?-Trend CBC ? ?#Normocytic anemia ?#History of Non-bleeding esophageal ulcer ?History of prior hospitalization due to GI bleed several months ago.  No signs of hemoptysis or melena.  Thinking this was likely hemodilution in the setting of hypovolemia with her recent vomiting episodes.  Medications include pantoprazole 40 mg twice daily. ?-Trend CBC ? ?#Paroxysmal A-fib ?Plan to restart apixaban 4/25. ? ?Best Practice: ?Diet: carb modified ?IVF: none ?VTE: heparin injection 5,000 Units Start: 07/14/21 1000  ?Code: Full ?AB: Vancomycin, Ceftriaxone day 1 ?Therapy Recs: Home Health ?DISPO: Anticipated discharge in 2-3 days to Home pending culture to help guide antibiotic regimen for osteomyelitis. ? ?Signature: ?Daleen Bo. Berlyn Saylor, D.O.  ?Internal Medicine Resident, PGY-1 ?[facility]  ?Pager: [phone] ?3:39 PM, 07/15/2021  ? ?Please contact the on call pager after 5 pm and on weekends at [phone].  ?

## 2021-07-15 NOTE — Transfer of Care (Signed)
Immediate Anesthesia Transfer of Care Note ? ?Patient: Tammy Good ? ?Procedure(s) Performed: LEFT FOOT BONE BIOPSY (Left: Foot) ?IRRIGATION AND DEBRIDEMENT FOOT (Left: Foot) ? ?Patient Location: PACU ? ?Anesthesia Type:MAC ? ?Level of Consciousness: awake and alert  ? ?Airway & Oxygen Therapy: Patient Spontanous Breathing ? ?Post-op Assessment: Report given to RN and Post -op Vital signs reviewed and stable ? ?Post vital signs: Reviewed and stable ? ?Last Vitals:  ?Vitals Value Taken Time  ?BP 134/65 07/15/21 1304  ?Temp    ?Pulse 89 07/15/21 1305  ?Resp 15 07/15/21 1305  ?SpO2 93 % 07/15/21 1305  ?Vitals shown include unvalidated device data. ? ?Last Pain:  ?Vitals:  ? 07/15/21 1100  ?TempSrc:   ?PainSc: 0-No pain  ?   ? ?Patients Stated Pain Goal: 3 (07/11/21 2255) ? ?Complications: No notable events documented. ?

## 2021-07-15 NOTE — Brief Op Note (Signed)
07/15/2021 ? ?12:53 PM ? ?PATIENT:  Tammy Good  71 y.o. female ? ?PRE-OPERATIVE DIAGNOSIS:  osteomyelitis left foot ? ?POST-OPERATIVE DIAGNOSIS:  osteomyelitis left foot, septic arthritis left foot ? ?PROCEDURE:  Procedure(s): ?LEFT FOOT BONE BIOPSY (Left) ?IRRIGATION AND DEBRIDEMENT FOOT (Left) ? ?SURGEON:  Surgeon(s) and Role: ?   Trula Slade, DPM - Primary ? ?PHYSICIAN ASSISTANT:  ? ?ASSISTANTS: none  ? ?ANESTHESIA:   general ? ?EBL:  20 mL  ? ?BLOOD ADMINISTERED:none ? ?DRAINS: none  ? ?LOCAL MEDICATIONS USED:  OTHER 20m of lidocaine and marcaine plain ? ?SPECIMEN:  Source of Specimen:  left bone biopsy of cuboid, calcaneous, navicular for pathology and microbiology ? ?DISPOSITION OF SPECIMEN:  PATHOLOGY ? ?COUNTS:  YES ? ?TOURNIQUET:  * No tourniquets in log * ? ?DICTATION: .Dragon Dictation ? ?PLAN OF CARE: Admit to inpatient  ? ?PATIENT DISPOSITION:  PACU - hemodynamically stable. ?  ?Delay start of Pharmacological VTE agent (>24hrs) due to surgical blood loss or risk of bleeding: no ? ?Intraoperative findings: ?No purulence noted. Bone biopsy preformed of multiple bones. The cuboid/lateral area did appear soft but other bones seemed viable. Will plan to wait for results and will discharge with long term antibiotics per ID. Patient/family are aware that she is still at high risk of BKA but she is not yet ready to proceed. If at any point infection worsens she will need to proceed with this.  ? ?

## 2021-07-15 NOTE — Progress Notes (Signed)
Occupational Therapy Treatment ?Patient Details ?Name: Tammy Good ?MRN: [id number] ?DOB: [birth year] ?Today's Date: 07/15/2021 ? ? 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 Pt underwent anteriogram with L LE stent placements on 4/20. Plan for bone marrow biopsy and I&D of L LE wound on 4/24. 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 ?  ?OT comments ? Pt progressing well towards OT goals, completing toileting tasks/transfers with Modified Independence during this admission (meeting 1/3 OT goals). Emphasis on dynamic standing balance and DME safety with gathering ADL items, including picking up items from floor without LOB. Further discussed strategies to carry items when using DME in the home. DC recs remain appropriate.   ? ?Recommendations for follow up therapy are one component of a multi-disciplinary discharge planning process, led by the attending physician.  Recommendations may be updated based on patient status, additional functional criteria and insurance authorization. ?   ?Follow Up Recommendations ? No OT follow up  ?  ?Assistance Recommended at Discharge PRN  ?Patient can return home with the following ? Assist for transportation;Help with stairs or ramp for entrance ?  ?Equipment Recommendations ? None recommended by OT  ?  ?Recommendations for Other Services   ? ?  ?Precautions / Restrictions Precautions ?Precautions: Fall ?Restrictions ?Weight Bearing Restrictions: No  ? ? ?  ? ?Mobility Bed Mobility ?Overal bed mobility: Modified Independent ?Bed Mobility: Supine to Sit, Sit to Supine ?  ?  ?Supine to sit: Modified independent (Device/Increase time) ?Sit to supine: Modified independent (Device/Increase time) ?  ?  ?  ? ?Transfers ?Overall transfer level: Modified independent ?Equipment used: Rolling walker (2 wheels) ?Transfers: Sit to/from Stand ?Sit to Stand: Modified independent (Device/Increase  time) ?  ?  ?  ?  ?  ?  ?  ?  ?Balance Overall balance assessment: Needs assistance ?Sitting-balance support: No upper extremity supported, Feet supported ?Sitting balance-Leahy Scale: Normal ?  ?  ?Standing balance support: During functional activity, Bilateral upper extremity supported ?Standing balance-Leahy Scale: Fair ?Standing balance comment: able to move short distances without AD, requests RW use for mobility ?  ?  ?  ?  ?  ?  ?  ?  ?  ?  ?  ?   ? ?ADL either performed or assessed with clinical judgement  ? ?ADL Overall ADL's : Needs assistance/impaired ?  ?  ?  ?  ?  ?  ?  ?  ?  ?  ?  ?  ?  ?  ?  ?  ?  ?  ?Functional mobility during ADLs: Modified independent;Rolling walker (2 wheels) ?General ADL Comments: Has been getting to/from [facility] and completing toileting tasks Modified Independent (toilet in bathroom too low in comparison to toilet at home per pt). Pt reports setup assist only for sponge bathing at sink. Emphasis on mobility in room, balance while picking up items from floor and strategies for carrying items when using RW (pt thinking about buying a basket for walker at home). ?  ? ?Extremity/Trunk Assessment Upper Extremity Assessment ?Upper Extremity Assessment: Overall WFL for tasks assessed ?  ?Lower Extremity Assessment ?Lower Extremity Assessment: Defer to PT evaluation ?  ?  ?  ? ?Vision   ?Vision Assessment?: No apparent visual deficits ?  ?Perception   ?  ?Praxis   ?  ? ?Cognition Arousal/Alertness: Awake/alert ?Behavior During Therapy: [facility] for tasks assessed/performed ?Overall  Cognitive Status: Within Functional Limits for tasks assessed ?  ?  ?  ?  ?  ?  ?  ?  ?  ?  ?  ?  ?  ?  ?  ?  ?General Comments: good self awareness,  monitoring and implementing safety strategies ?  ?  ?   ?Exercises   ? ?  ?Shoulder Instructions   ? ? ?  ?General Comments VSS on RA  ? ? ?Pertinent Vitals/ Pain       Pain Assessment ?Pain Assessment: No/denies pain ? ?Home Living   ?  ?  ?  ?  ?  ?  ?  ?  ?  ?  ?  ?   ?  ?  ?  ?  ?  ?  ? ?  ?Prior Functioning/Environment    ?  ?  ?  ?   ? ?Frequency ? Min 2X/week  ? ? ? ? ?  ?Progress Toward Goals ? ?OT Goals(current goals can now be found in the care plan section) ? Progress towards OT goals: Progressing toward goals ? ?Acute Rehab OT Goals ?Patient Stated Goal: avoid amputations, go home soon ?OT Goal Formulation: With patient ?Time For Goal Achievement: 07/23/21 ?Potential to Achieve Goals: Good ?ADL Goals ?Pt Will Transfer to Toilet: with modified independence;ambulating ?Additional ADL Goal #1: Pt to gather ADL/IADL items with MOD I without LOB or safety concerns ?Additional ADL Goal #2: Pt to increase standing activity tolerance > 7 min to maximize endurance for ADLs/IADLs  ?Plan Discharge plan remains appropriate   ? ?Co-evaluation ? ? ?   ?  ?  ?  ?  ? ?  ?AM-PAC OT "6 Clicks" Daily Activity     ?Outcome Measure ? ? Help from another person eating meals?: None ?Help from another person taking care of personal grooming?: None ?Help from another person toileting, which includes using toliet, bedpan, or urinal?: None ?Help from another person bathing (including washing, rinsing, drying)?: A Little ?Help from another person to put on and taking off regular upper body clothing?: None ?Help from another person to put on and taking off regular lower body clothing?: A Little ?6 Click Score: 22 ? ?  ?End of Session Equipment Utilized During Treatment: Rolling walker (2 wheels) ? ?OT Visit Diagnosis: Other abnormalities of gait and mobility (R26.89) ?  ?Activity Tolerance Patient tolerated treatment well ?  ?Patient Left in bed;with call bell/phone within reach;with nursing/sitter in room ?  ?Nurse Communication Mobility status ?  ? ?   ? ?Time: 2542-7062 ?OT Time Calculation (min): 15 min ? ?Charges: OT General Charges ?$OT Visit: 1 Visit ?OT Treatments ?$Self Care/Home Management : 8-22 mins ? ?Malachy Chamber, OTR/L ?Acute Rehab Services ?Office: [phone]  ? ?Tammy Good ?07/15/2021, 9:27 AM ?

## 2021-07-15 NOTE — Progress Notes (Signed)
Notified [name] that patient took Plavix yesterday and today. Confirmed with [name] that this is ok for surgery today.  ?

## 2021-07-15 NOTE — Progress Notes (Signed)
Pt came back to rm 1 from PACU. Reinitiated tele. Vss. Call bell within reach.  ? ?Lavenia Atlas, RN ? ?

## 2021-07-15 NOTE — Progress Notes (Signed)
?  [facility] for Infectious Disease ? ? ?Reason for visit: Follow up on osteomyelitis ? ?Interval History: s/p operative debridement and bone biopsy; wbc 7.2, remains afebrile.  ? Day 9 total antibiotics ? ?Physical Exam: ?Constitutional:  ?Vitals:  ? 07/15/21 1315 07/15/21 1330  ?BP: 133/65 (!) 125/57  ?Pulse: 87 96  ?Resp: 15 (!) 25  ?Temp:  98.4 ?F (36.9 ?C)  ?SpO2: 93% 93%  ? patient appears in NAD ?Respiratory: Normal respiratory effort  ? ?Review of Systems: ?Constitutional: negative for fevers and chills ? ?Lab Results  ?Component Value Date  ? WBC 7.2 07/15/2021  ? HGB 8.6 (L) 07/15/2021  ? HCT 28.4 (L) 07/15/2021  ? MCV 87.7 07/15/2021  ? PLT 450 (H) 07/15/2021  ?  ?Lab Results  ?Component Value Date  ? CREATININE 0.75 07/15/2021  ? BUN <5 (L) 07/15/2021  ? NA 138 07/15/2021  ? K 3.7 07/15/2021  ? CL 103 07/15/2021  ? CO2 28 07/15/2021  ?  ?Lab Results  ?Component Value Date  ? ALT 11 07/07/2021  ? AST 17 07/07/2021  ? ALKPHOS 97 07/07/2021  ?  ? ?Microbiology: ?Recent Results (from the past 240 hour(s))  ?Blood Culture (routine x 2)     Status: None  ? Collection Time: 07/07/21  3:25 AM  ? Specimen: BLOOD  ?Result Value Ref Range Status  ? Specimen Description BLOOD RIGHT ANTECUBITAL  Final  ? Special Requests   Final  ?  BOTTLES DRAWN AEROBIC AND ANAEROBIC Blood Culture results may not be optimal due to an excessive volume of blood received in culture bottles  ? Culture   Final  ?  NO GROWTH 5 DAYS ?Performed at [facility], [place] [address], [place], [place] [address] ?  ? Report Status 07/12/2021 FINAL  Final  ?MRSA Next Gen by PCR, Nasal     Status: Abnormal  ? Collection Time: 07/09/21 12:18 PM  ? Specimen: Nasal Mucosa; Nasal Swab  ?Result Value Ref Range Status  ? MRSA by PCR Next Gen DETECTED (A) NOT DETECTED Final  ?  Comment: RESULTS CALLED TO READ BACK BY AND VERFIED WITH RN K.BIRD '@1429'$  ON 07/09/2021 BY NM ?(NOTE) ?The GeneXpert MRSA Assay (FDA approved for NASAL specimens only), ?is  one component of a comprehensive MRSA colonization surveillance ?program. It is not intended to diagnose MRSA infection nor to guide ?or monitor treatment for MRSA infections. ?Test performance is not FDA approved in patients less than 2 years ?old. ?Performed at [facility], [place] [address] [place], [place] ?[address] ?  ?Surgical PCR screen     Status: Abnormal  ? Collection Time: 07/15/21  7:44 AM  ? Specimen: Nasal Mucosa; Nasal Swab  ?Result Value Ref Range Status  ? MRSA, PCR POSITIVE (A) NEGATIVE Final  ?  Comment: RESULT CALLED TO, READ BACK BY AND VERIFIED WITH: ?RN [facility] ON [id number] AT 1053 BY E.PARRISH ?  ? Staphylococcus aureus POSITIVE (A) NEGATIVE Final  ?  Comment: (NOTE) ?The Xpert SA Assay (FDA approved for NASAL specimens in patients 87 ?years of age and older), is one component of a comprehensive ?surveillance program. It is not intended to diagnose infection nor to ?guide or monitor treatment. ?Performed at [facility], [place] [address] [place], [place] ?[address] ?  ? ? ?Impression/Plan:  ?1. Osteomyelitis with septic arthritis left foot - now s/p debridement and bone biopsy.  Multiple appropriate cultures sent and will monitor results.  For now, will  continue with vancomcyin and ceftriaxone pending any growth.   ? ?2.  Medication monitoring - creat wnl on vancomycin.  Will continue to monitor.  ? ?3.  Peripheral arterial disease - seen by vascular surgery and optimized with sufficient perfusion.  On statin and Plavix.   ? ?  ?

## 2021-07-15 NOTE — Op Note (Signed)
PRE-OPERATIVE DIAGNOSIS:  osteomyelitis left foot ?  ?POST-OPERATIVE DIAGNOSIS:  osteomyelitis left foot, septic arthritis left foot ?  ?PROCEDURE:  Procedure(s): ?LEFT FOOT BONE BIOPSY (Left) ?IRRIGATION AND DEBRIDEMENT FOOT (Left) ?  ?SURGEON:  Surgeon(s) and Role: ?   Trula Slade, DPM - Primary ?  ?PHYSICIAN ASSISTANT:  ?  ?ASSISTANTS: none  ?  ?ANESTHESIA:   general ?  ?EBL:  20 mL  ?  ?BLOOD ADMINISTERED:none ?  ?DRAINS: none  ?  ?LOCAL MEDICATIONS USED:  OTHER 38m of lidocaine and marcaine plain ?  ?SPECIMEN:  Source of Specimen:  left bone biopsy of cuboid, calcaneous, navicular for pathology and microbiology ?  ?DISPOSITION OF SPECIMEN:  PATHOLOGY ?  ?COUNTS:  YES ?  ?TOURNIQUET:  * No tourniquets in log * ?  ?DICTATION: .Dragon Dictation ?  ?PLAN OF CARE: Admit to inpatient  ?  ?PATIENT DISPOSITION:  PACU - hemodynamically stable. ?  ?Delay start of Pharmacological VTE agent (>24hrs) due to surgical blood loss or risk of bleeding: no ? ?Indications for surgery: ?71year old female admitted to the hospital complaining of worsening infection of her left foot.  MRI was performed which revealed osteomyelitis multiple wounds of her foot.  The wound clinically would be stable as well as the infection in her foot clinically however the MRI was very concerning.  The patient did not proceed with amputation of the leg.  We discussed trying to salvage the foot and discussed I&D, bone biopsies as well as debridement of the wound.  Patient is very well aware that there is a high risk of below-knee amputation there is a chance that antibiotics is not to cure this infection.  However despite this she wants to try to attempt this.  I discussed the surgery as well as postoperative course.  Alternatives, risks, complications were discussed.  No promises or guarantees given second of the procedure and all questions were answered to the best my ability. ? ?Procedure in detail: ?The patient was both verbally and  visually identified by myself and nursing staff in the anesthesia staff preoperatively.  She was then transferred operating room via stretcher and placed on the operative table in supine position.  After adequate plane of anesthesia obtained a timeout was performed and a mixture of 20 cc of lidocaine, Marcaine plain was infiltrated in a regional block fashion.  The left lower extremities and scrubbed, prepped, draped in normal sterile fashion. ? ?A secondary timeout was performed.  At this time attention was directed along the calcaneocuboid joint which a linear incision was made with a #15 with scalpel to the epidermis and the dermis.  Subcutaneous tissue was bluntly dissected and there was found to be inflammatory tissue present but there is no frank purulence noted.  I dissected down to the calcaneus as well as the cuboid.  I was able to go directly to the calcaneocuboid joint no purulence was noted.  I then took a Jamshidi needle in order to remove bone from the cuboid which did appear to be soft in nature.  I also took a sample of bone from the calcaneus.  Both of this was sent for culture as well as pathology.  The wound was copiously irrigated with saline and hemostasis achieved.  The incision was then closed with 3-0 nylon. ? ?At this time a secondary incision was made along the talonavicular joint.  The incision was again made with a 15 scalpel the epidermis and dermis.  Blunt dissection was then carried down  and then a deep incision on the talonavicular joint.  Again no purulence was noted but some inflammatory tissue was present.  I took a culture of the navicular.  Navicular bone appeared to be hard in nature.  Clinically the talus appeared to be healthy.  There is no purulence noted I do not proceed with further cultures of this area as of already obtained multiple cultures.  Incision was copiously irrigated with saline and hemostasis achieved.  The incision was closed with nylon. ? ?Attention was then  directed along the lateral ankle wound.  Prior to debridement it measured about 2.5 x 1.5 x 0.1 cm.  I utilized a #10 blade scalpel debride the wound down to healthy, bleeding tissue remove any nonviable devitalized tissue and promote wound healing.  After debridement the wound measured 2.8 x 1.8 x 0.2 cm.  There is no probing, undermining or tunneling.  There is no surrounding erythema.  No fluctuance or crepitation.  Hemostasis achieved. ? ?Xeroform was applied to the wound as well as the incisions followed by dry sterile dressing.  She is awoken from anesthesia and found to tolerate the procedure well any complications.  She was transferred to PACU vital signs stable vascular status intact. ? ?Postoperative course: ?Treatment inpatient waiting culture results and likely discharge home with long-term antibiotics per infectious disease.  We will follow-up in the office next week with myself.  ?

## 2021-07-15 NOTE — Interval H&P Note (Signed)
History and Physical Interval Note: ? ?07/15/2021 ?11:53 AM ? ?Tammy Good  has presented today for surgery, with the diagnosis of osteomyelitis foot.  The various methods of treatment have been discussed with the patient and family. After consideration of risks, benefits and other options for treatment, the patient has consented to  Procedure(s): ?BONE BIOPSY with irrigation and debridement, foot/ankle (Left) as a surgical intervention.  The patient's history has been reviewed, patient examined, no change in status, stable for surgery.  I have reviewed the patient's chart and labs.  Questions were answered to the patient's satisfaction.   ? ? ?Trula Slade ? ? ?

## 2021-07-15 NOTE — Care Management Important Message (Signed)
Important Message ? ?Patient Details  ?Name: Tammy Good ?MRN: [id number] ?Date of Birth: 1950-05-21 ? ? ?Medicare Important Message Given:  Yes ? ? ? ? ?[name] ?07/15/2021, 9:00 AM ?

## 2021-07-15 NOTE — Progress Notes (Signed)
Physical Therapy Treatment ?Patient Details ?Name: Tammy Good ?MRN: [id number] ?DOB: Nov 05, 1950 ?Today's Date: 07/15/2021 ? ? 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 Pt underwent anteriogram with L LE stent placements on 4/20. Plan for bone marrow biopsy and I&D of L LE wound on 4/24. 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 ? ?  ?PT Comments  ? ? Patient progressing well towards PT goals. Session focused on transfers and progressive ambulation. Reports no pain today. Tolerated gait training with use of RW for support. HR ranging from 110-126 bpm with activity. MD arrived during session so deferred further exercise/mobility. Will continue to work on overall strengthening and stair training next session to prepare for d/c home. Will follow post OR. ?  ?Recommendations for follow up therapy are one component of a multi-disciplinary discharge planning process, led by the attending physician.  Recommendations may be updated based on patient status, additional functional criteria and insurance authorization. ? ?Follow Up Recommendations ? Home health PT (vs no follow up pending progress) ?  ?  ?Assistance Recommended at Discharge PRN  ?Patient can return home with the following Assist for transportation;Help with stairs or ramp for entrance;Assistance with cooking/housework ?  ?Equipment Recommendations ? Rolling walker (2 wheels)  ?  ?Recommendations for Other Services   ? ? ?  ?Precautions / Restrictions Precautions ?Precautions: Fall ?Restrictions ?Weight Bearing Restrictions: No  ?  ? ?Mobility ? Bed Mobility ?Overal bed mobility: Modified Independent ?Bed Mobility: Supine to Sit, Sit to Supine ?  ?  ?Supine to sit: Modified independent (Device/Increase time), HOB elevated ?Sit to supine: Modified independent (Device/Increase time), HOB elevated ?  ?General bed mobility comments: No assist needed. ?   ? ?Transfers ?Overall transfer level: Modified independent ?Equipment used: Rolling walker (2 wheels) ?Transfers: Sit to/from Stand ?Sit to Stand: Modified independent (Device/Increase time) ?Stand pivot transfers: Modified independent (Device/Increase time) ?  ?  ?  ?  ?General transfer comment: Stood from EOB x1 without difficulty. SPT bed to/from [facility] Mod I ?  ? ?Ambulation/Gait ?Ambulation/Gait assistance: Supervision ?Gait Distance (Feet): 120 Feet ?Assistive device: Rolling walker (2 wheels) ?Gait Pattern/deviations: Decreased stride length, Step-through pattern ?Gait velocity: decreased ?Gait velocity interpretation: 1.31 - 2.62 ft/sec, indicative of limited community ambulator ?  ?General Gait Details: Slow, steady gait with use of RW for support. HR ranging from 110-126 bpm. ? ? ?Stairs ?  ?  ?  ?  ?  ? ? ?Wheelchair Mobility ?  ? ?Modified Rankin (Stroke Patients Only) ?  ? ? ?  ?Balance Overall balance assessment: Needs assistance ?Sitting-balance support: Feet supported, No upper extremity supported ?Sitting balance-Leahy Scale: Normal ?  ?  ?Standing balance support: During functional activity ?Standing balance-Leahy Scale: Fair ?Standing balance comment: Able to perform pericare without difficulty. ?  ?  ?  ?  ?  ?  ?  ?  ?  ?  ?  ?  ? ?  ?Cognition Arousal/Alertness: Awake/alert ?Behavior During Therapy: [facility] for tasks assessed/performed ?Overall Cognitive Status: Within Functional Limits for tasks assessed ?  ?  ?  ?  ?  ?  ?  ?  ?  ?  ?  ?  ?  ?  ?  ?  ?  ?  ?  ? ?  ?Exercises   ? ?  ?General Comments General comments (skin integrity, edema, etc.): HR ranging from 110-126  bpm. ?  ?  ? ?Pertinent Vitals/Pain Pain Assessment ?Pain Assessment: No/denies pain  ? ? ?Home Living   ?  ?  ?  ?  ?  ?  ?  ?  ?  ?   ?  ?Prior Function    ?  ?  ?   ? ?PT Goals (current goals can now be found in the care plan section) Progress towards PT goals: Progressing toward goals ? ?  ?Frequency ? ? ? Min 3X/week ? ? ? ?   ?PT Plan Current plan remains appropriate  ? ? ?Co-evaluation   ?  ?  ?  ?  ? ?  ?AM-PAC PT "6 Clicks" Mobility   ?Outcome Measure ? Help needed turning from your back to your side while in a flat bed without using bedrails?: None ?Help needed moving from lying on your back to sitting on the side of a flat bed without using bedrails?: None ?Help needed moving to and from a bed to a chair (including a wheelchair)?: None ?Help needed standing up from a chair using your arms (e.g., wheelchair or bedside chair)?: None ?Help needed to walk in hospital room?: A Little ?Help needed climbing 3-5 steps with a railing? : A Little ?6 Click Score: 22 ? ?  ?End of Session Equipment Utilized During Treatment: Gait belt ?Activity Tolerance: Patient tolerated treatment well ?Patient left: in bed;with call bell/phone within reach;with bed alarm set;Other (comment) (with MD in room) ?Nurse Communication: Mobility status ?PT Visit Diagnosis: Muscle weakness (generalized) (M62.81) ?  ? ? ?Time: 3494-9447 ?PT Time Calculation (min) (ACUTE ONLY): 13 min ? ?Charges:  $Gait Training: 8-22 mins          ?          ? ?Marisa Severin, PT, DPT ?Acute Rehabilitation Services ?Secure chat preferred ?Office [phone] ? ? ? ? ? ?[place] ?07/15/2021, 10:24 AM ? ?

## 2021-07-16 ENCOUNTER — Ambulatory Visit: Payer: Medicare Other | Admitting: Gastroenterology

## 2021-07-16 ENCOUNTER — Encounter (HOSPITAL_COMMUNITY): Payer: Self-pay | Admitting: Podiatry

## 2021-07-16 ENCOUNTER — Inpatient Hospital Stay: Payer: Self-pay

## 2021-07-16 DIAGNOSIS — M86272 Subacute osteomyelitis, left ankle and foot: Secondary | ICD-10-CM | POA: Diagnosis not present

## 2021-07-16 LAB — ACID FAST SMEAR (AFB, MYCOBACTERIA)
Acid Fast Smear: NEGATIVE
Acid Fast Smear: NEGATIVE

## 2021-07-16 LAB — CBC
HCT: 28.1 % — ABNORMAL LOW (ref 36.0–46.0)
Hemoglobin: 8.6 g/dL — ABNORMAL LOW (ref 12.0–15.0)
MCH: 26.8 pg (ref 26.0–34.0)
MCHC: 30.6 g/dL (ref 30.0–36.0)
MCV: 87.5 fL (ref 80.0–100.0)
Platelets: 452 10*3/uL — ABNORMAL HIGH (ref 150–400)
RBC: 3.21 MIL/uL — ABNORMAL LOW (ref 3.87–5.11)
RDW: 19.7 % — ABNORMAL HIGH (ref 11.5–15.5)
WBC: 6.4 10*3/uL (ref 4.0–10.5)
nRBC: 0 % (ref 0.0–0.2)

## 2021-07-16 LAB — GLUCOSE, CAPILLARY
Glucose-Capillary: 101 mg/dL — ABNORMAL HIGH (ref 70–99)
Glucose-Capillary: 88 mg/dL (ref 70–99)
Glucose-Capillary: 220 mg/dL — ABNORMAL HIGH (ref 70–99)
Glucose-Capillary: 163 mg/dL — ABNORMAL HIGH (ref 70–99)

## 2021-07-16 LAB — BASIC METABOLIC PANEL
Anion gap: 4 — ABNORMAL LOW (ref 5–15)
BUN: <5 mg/dL — ABNORMAL LOW (ref 8–23)
CO2: 27 mmol/L (ref 22–32)
Calcium: 8 mg/dL — ABNORMAL LOW (ref 8.9–10.3)
Chloride: 106 mmol/L (ref 98–111)
Creatinine, Ser: 0.73 mg/dL (ref 0.44–1.00)
GFR, Estimated: >60 mL/min (ref >60)
Glucose, Bld: 94 mg/dL (ref 70–99)
Potassium: 3.5 mmol/L (ref 3.5–5.1)
Sodium: 137 mmol/L (ref 135–145)

## 2021-07-16 MED ORDER — APIXABAN 5 MG PO TABS
5.0000 mg | ORAL_TABLET | Freq: Two times a day (BID) | ORAL | Status: DC
  Administered 2021-07-16 – 2021-07-17 (×3): 5 mg via ORAL
  Filled 2021-07-16 (×3): qty 1

## 2021-07-16 NOTE — TOC Progression Note (Signed)
Transition of Care (TOC) - Progression Note  ?Marvetta Gibbons Therapist, sports, BSN ?Transitions of Care ?Unit 4E- RN Case Manager ?See Treatment Team for direct phone #  ? ? ?Patient Details  ?Name: Tammy Good ?MRN: [id number] ?Date of Birth: December 29, 1950 ? ?Transition of Care (TOC) CM/SW Contact  ?Dawayne Patricia, RN ?Phone Number: ?07/16/2021, 3:35 PM ? ?Clinical Narrative:    ?Noted ID note that pt will need 6wks of IV abx- Pt is active with [facility] for [facility] needs- new orders have been placed for HHRN/PT/OT/aide/SW- call made to [facility] with [facility] to update on [facility] need for IV abx - per Ramond Marrow they will be able to service for this as well- and will follow for discharge date. Pt is pending PICC line placement- anticipate discharge once PICC line is in.  ?Also reached out to [facility] with [facility]- pt has been a patient of theirs as well- and they can service again as needed. Informed Pam of new home IV abx needs for 6wks- OPAT pending. Pam to f/u with ID for home abx needs. Pam will also f/u with pt for bedside education prior to discharge.  ? ?TOC to continue following - HH and home IV abx needs set up with [facility] and [facility] who will coordinate for home pending PICC placement.  ? ? ?Expected Discharge Plan: [place] ?Barriers to Discharge: Continued Medical Work up ? ?Expected Discharge Plan and Services ?Expected Discharge Plan: [place] ?  ?Discharge Planning Services: CM Consult ?Post Acute Care Choice: Home Health ?Living arrangements for the past 2 months: [place] ?                ?DME Arranged:  (RW recommended,patient has one at home.) ?  ?  ?  ?  ?HH Arranged: RN, PT, OT, Nurse's Aide, Social Work, IV Antibiotics ?[place] Agency: [place] ([facility]), [facility] ?Date HH Agency Contacted: 07/16/21 ?Time [place]: 1000 ?Representative spoke with at [place]: [place]/Pam ? ? ?Social Determinants of Health (SDOH) Interventions ?  ? ?Readmission  Risk Interventions ? ?  06/03/2021  ? 12:08 PM 05/30/2019  ?  1:15 PM 05/27/2019  ?  5:19 PM  ?Readmission Risk Prevention Plan  ?Transportation Screening Complete Complete Complete  ?PCP or Specialist Appt within 5-7 Days  Complete Complete  ?Home Care Screening  Complete Complete  ?Medication Review (RN CM)  Complete Complete  ?Medication Review Press photographer) Complete    ?PCP or Specialist appointment within 3-5 days of discharge Complete    ?[place] or Home Care Consult Complete    ?SW Recovery Care/Counseling Consult Complete    ?Palliative Care Screening Not Applicable    ?[place] Not Applicable    ? ? ?

## 2021-07-16 NOTE — Progress Notes (Addendum)
Physical Therapy Treatment ?Patient Details ?Name: Tammy Good ?MRN: [id number] ?DOB: 1950-08-22 ?Today's Date: 07/16/2021 ? ? 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 Pt underwent anteriogram with L LE stent placements on 4/20. Plan for bone marrow biopsy and I&D of L LE wound on 4/24. 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 ? ?  ?PT Comments  ? ? Pt received in supine, agreeable to physical therapy session with emphasis on gait and stairs training. Pt is making progress toward PT goals, with increased activity tolerance and ambulation. Practiced basic lower extremity exercises in sitting and stood bedside with use of RW for UE support. Pt ambulated 60 feet in the hallway with use of RW with min guard assist and stepped up and down 3 stairs with use of handrail and min guard assist. Pt had some initial confusion and needing verbal cues with instruction on how to use proper foot placement and upright posture when ascending and descending stairs. Pt complained of slight pain in her left side as she descended the stairs, monitor checked and HR max 141 bpm. After taking a standing break at the bottom of the stairs, pt reports that pain had subsided, once seated EOB HR decreased to 100 bpm. Plan for next session is to increase distance while gait training and stairs with emphasis on proper foot placement and posture for safe use of stairs. Pt left in bed with HOB elevated, call bell within reach, and bed alarm set. ?  ?Recommendations for follow up therapy are one component of a multi-disciplinary discharge planning process, led by the attending physician.  Recommendations may be updated based on patient status, additional functional criteria and insurance authorization. ? ?Follow Up Recommendations ? Home health PT ?  ?  ?Assistance Recommended at Discharge PRN  ?Patient can return home with the following Assist  for transportation;Help with stairs or ramp for entrance;Assistance with cooking/housework ?  ?Equipment Recommendations ? Rolling walker (2 wheels)  ?  ?Recommendations for Other Services   ? ? ?  ?Precautions / Restrictions Precautions ?Precautions: Fall ?Precaution Comments: Contact -MRSA ?Restrictions ?Weight Bearing Restrictions: No  ?  ? ?Mobility ? Bed Mobility ?Overal bed mobility: Modified Independent ?Bed Mobility: Supine to Sit, Sit to Supine ?  ?  ?Supine to sit: Modified independent (Device/Increase time) ?Sit to supine: Modified independent (Device/Increase time) ?  ?General bed mobility comments: No assist needed. ?  ? ?Transfers ?Overall transfer level: Needs assistance ?Equipment used: Rolling walker (2 wheels) ?Transfers: Sit to/from Stand ?Sit to Stand: Min guard ?   ?General transfer comment: Stood from EOB x1 without difficulty ?  ? ?Ambulation/Gait ?Ambulation/Gait assistance: Min guard ?Gait Distance (Feet): 60 Feet ?Assistive device: Rolling walker (2 wheels) ?Gait Pattern/deviations: Step-through pattern, Decreased stride length ?Gait velocity: decreased ?  ?  ?General Gait Details: Slow, steady gait with use of RW for support. ? ? ?Stairs ?Stairs: Yes ?Stairs assistance: Min guard ?Stair Management: One rail Left, Step to pattern, Forwards ?Number of Stairs: 3 ?General stair comments: pt used both hands on the left side railing as she ascended and descended the stairs. Needed verbal cues to correct posture for safety. Pt reported feeling some pain in her left side after descending the stairs. After standing at the bottom of the stairs for a minute pain subsided. ? ? ? ? ? ?  ?Balance Overall balance assessment: Needs assistance ?Sitting-balance support:  Feet supported, No upper extremity supported ?Sitting balance-Leahy Scale: Normal ?  ?  ?Standing balance support: During functional activity ?Standing balance-Leahy Scale: Good ?  ?  ?Cognition Arousal/Alertness: Awake/alert ?Behavior  During Therapy: [facility] for tasks assessed/performed ?Overall Cognitive Status: Impaired/Different from baseline ?Area of Impairment: Following commands ?  ?Following Commands: Follows one step commands consistently ?  ?  ?  ?General Comments: pt needing verbal and manual cues for monitoring safety precautions and awareness of lines while going up and down stairs. ?  ?  ? ?  ?Exercises General Exercises - Lower Extremity ?Long Arc Quad: AROM, Both, 10 reps, Seated ? ?  ?   ? ?Pertinent Vitals/Pain Pain Assessment ?Pain Assessment: No/denies pain  ? ? ?   ?   ? ?PT Goals (current goals can now be found in the care plan section) Acute Rehab PT Goals ?Patient Stated Goal: pt wants to go home ?PT Goal Formulation: With patient ?Time For Goal Achievement: 07/23/21 ?Potential to Achieve Goals: Good ?Progress towards PT goals: Progressing toward goals ? ?  ?Frequency ? ? ? Min 3X/week ? ? ? ?  ?PT Plan Current plan remains appropriate  ? ? ?   ?AM-PAC PT "6 Clicks" Mobility   ?Outcome Measure ? Help needed turning from your back to your side while in a flat bed without using bedrails?: None ?Help needed moving from lying on your back to sitting on the side of a flat bed without using bedrails?: None ?Help needed moving to and from a bed to a chair (including a wheelchair)?: A Little ?Help needed standing up from a chair using your arms (e.g., wheelchair or bedside chair)?: A Little ?Help needed to walk in hospital room?: A Little ?Help needed climbing 3-5 steps with a railing? : A Lot ?6 Click Score: 19 ? ?  ?End of Session Equipment Utilized During Treatment: Gait belt ?Activity Tolerance: Patient tolerated treatment well ?Patient left: in bed;with call bell/phone within reach;with bed alarm set ?  ?PT Visit Diagnosis: Muscle weakness (generalized) (M62.81) ?  ? ? ?Time: 1410-3013 ?PT Time Calculation (min) (ACUTE ONLY): 23 min ? ?Charges:  $Gait Training: 8-22 mins ?$Therapeutic Exercise: 8-22 mins          ?           ? ?Wadie Lessen, SPTA ? ? ? ?[name] ?07/16/2021, 1:55 PM ? ?

## 2021-07-16 NOTE — Progress Notes (Signed)
? ?HD#10 ?SUBJECTIVE:  ?Patient Summary: Tammy Good is a 71 y.o. 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  ? ?Overnight Events: None ? ?Interim History: Patient assessed at bedside this AM.  We talked about the plan for her to go home later this evening after getting a PICC line and determining the appropriate dosing of her vancomycin.  We also talked with her about continuing increased steroid dosing for 2 weeks until following up with her primary care physician.  She also endorsed understanding to hold long-acting insulin and monitor blood sugars before restarting insulin.  No other concerns at this time. ? ?OBJECTIVE:  ?Vital Signs: ?Vitals:  ? 07/17/21 0040 07/17/21 0505 07/17/21 0733 07/17/21 1052  ?BP: 129/61 135/63 (!) 153/70 (!) 148/69  ?Pulse: 75 66 71 82  ?Resp: '16 17 18 17  '$ ?Temp: 98.4 ?F (36.9 ?C) 98.4 ?F (36.9 ?C) 98.6 ?F (37 ?C) 98.4 ?F (36.9 ?C)  ?TempSrc: Oral Oral Oral Oral  ?SpO2: 96% 96% 99% 100%  ?Weight:      ?Height:      ? ?Supplemental O2: Room Air ?SpO2: 100 % ?O2 Flow Rate (L/min): 2 L/min ? ?Filed Weights  ? 07/07/21 2107 07/15/21 0700 07/15/21 1100  ?Weight: 86.9 kg 86.9 kg 86.9 kg  ? ? ? ?Intake/Output Summary (Last 24 hours) at 07/17/2021 1410 ?Last data filed at 07/17/2021 1157 ?Gross per 24 hour  ?Intake 980 ml  ?Output --  ?Net 980 ml  ? ?Net IO Since Admission: -1,690.64 mL [07/17/21 1410] ? ?Physical Exam: ?Constitutional: well-appearing, sitting up in bed eating breakfast ?HENT: normocephalic atraumatic, mucous membranes moist ?Eyes: conjunctiva non-erythematous ?Neck: supple ?Cardiovascular: regular rate and rhythm, no m/r/g ?Pulmonary/Chest: normal work of breathing on room air, lungs clear to auscultation bilaterally ?Abdominal: soft, non-tender, non-distended ?MSK: Ace bandage in  place over left foot ?Neurological: alert & oriented x 3 ?Skin: warm and dry ?Psych: Normal mood and affect ? ?Patient Lines/Drains/Airways Status   ? ? Active Line/Drains/Airways   ? ? Name Placement date Placement time Site Days  ? Peripheral IV 07/07/21 18 G Right Antecubital 07/07/21  0328  Antecubital  1  ? External Urinary Catheter 06/05/21  0000  --  33  ? External Urinary Catheter 07/07/21  2107  --  1  ? Incision (Closed) 05/25/19 Foot 05/25/19  1303  -- 775  ? Incision (Closed) 04/24/21 Ankle Left 04/24/21  2259  -- 75  ? Pressure Injury 05/31/21 Sacrum Mid;Lower Stage 2 -  Partial thickness loss of dermis presenting as a shallow open injury with a red, pink wound bed without slough. denuded area between buttock on sacrum-pink and peeling 05/31/21  0300  -- 38  ? Wound / Incision (Open or Dehisced) 04/23/21 Other (Comment) Ankle Left full thickness wound 04/23/21  --  Ankle  76  ? Wound / Incision (Open or Dehisced) 04/23/21 Other (Comment) Foot Left full thickness wound 04/23/21  --  Foot  76  ? Wound / Incision (Open or Dehisced) 04/26/21 Skin tear Thigh Left;Posterior Pinched while on [facility] 04/26/21  2306  Thigh  73  ? ?  ?  ? ?  ? ? ?Pertinent Labs: ? ?  Latest Ref Rng & Units 07/17/2021  ?  1:25 AM 07/16/2021  ? 12:17 AM 07/15/2021  ?  2:06 AM  ?CBC  ?WBC 4.0 -  10.5 K/uL 7.3   6.4   7.2    ?Hemoglobin 12.0 - 15.0 g/dL 8.9   8.6   8.6    ?Hematocrit 36.0 - 46.0 % 28.5   28.1   28.4    ?Platelets 150 - 400 K/uL 472   452   450    ? ? ? ?  Latest Ref Rng & Units 07/17/2021  ?  1:25 AM 07/16/2021  ? 12:17 AM 07/15/2021  ?  2:06 AM  ?CMP  ?Glucose 70 - 99 mg/dL 146   94   138    ?BUN 8 - 23 mg/dL <5   <5   <5    ?Creatinine 0.44 - 1.00 mg/dL 0.76   0.73   0.75    ?Sodium 135 - 145 mmol/L 138   137   138    ?Potassium 3.5 - 5.1 mmol/L 3.8   3.5   3.7    ?Chloride 98 - 111 mmol/L 104   106   103    ?CO2 22 - 32 mmol/L '29   27   28    '$ ?Calcium 8.9 - 10.3 mg/dL 8.1   8.0   8.2    ? ? ?Recent Labs  ?  07/16/21 ?2131  07/17/21 ?0622 07/17/21 ?1050  ?GLUCAP 163* 139* 178*  ?  ? ?ASSESSMENT/PLAN:  ?Assessment: ? ?Principal Problem: ?  Osteomyelitis of left foot ([place]) ?Active Problems: ?  Panhypopituitarism ([place]) ?  Diabetes mellitus, type 2 ([place]) ?  Essential hypertension ?  PAD (peripheral artery disease) ([place]) ? ? ? ?Tammy Good is a 71 y.o. 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  She will be sent home with IV antibiotics and today we are working on placing PICC line and establishing doses of antibiotics from Vanc level. ? 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 Bone biopsy with ID and will require several weeks of IV antibiotics.  PICC line was placed this morning.  She will need vancomycin levels redrawn this afternoon prior to determining vancomycin dose for home.  She will continue on IV antibiotics until 6/6.  Plan for follow-up with [name] in 1 week. ?-PICC line in place ?-Plan to continue vancomycin and ceftriaxone until 6/6, she will follow-up with ID in 2 weeks ?-follow-up with podiatry in 1 week ? ?#PAD status post left SFA stenting in 2021. ?Arteriogram completed 4/20, left SFA was treated with stenting and angioplasty. There was significant small vessel disease in the foot.  ?- clopidogrel '75mg'$  daily ?- Atorvastatin '40mg'$  daily ? ?#Acquired panhypopituitarism ?Patient will be discharged on just dosed steroids with 1 g dexamethasone.  I talked with her about following up with her primary care provider in 2 weeks to decrease steroid  dosing at that time.  Blood pressures have remained normotensive with lower dosing of midodrine. ?-Dexamethasone 1 mg daily at discharge ?-Synthroid 125 mcg ?-midodrine 2.5 mg ? ?#Insulin- dependent diabetes ?Her appetite is improving.  Her long-acting insulin has been held for the last 3 days without further episodes of hypoglycemia.  She was instructed to continue to hold this at  home and check morning blood sugars.  If consistently elevated over 180 she is not asked to restart glargine 20 units.  ? ? ?Best Practice: ?Diet: carb modified ?IVF: none ?VTE:  apixaban for afib ?Code: Full ?AB: Vancomycin, Ceftriaxone  ?Therapy Recs: Home Health ?DISPO: Anticipated discharge later today to Home pending culture to help guide antibiotic regimen for osteomyelitis. ? ?Signature: ?Daleen Bo. Tiann Saha, D.O.  ?Internal Medicine Resident, PGY-1 ?[facility]  ?Pager: [phone] ?2:10 PM, 07/17/2021  ? ?Please contact the on call pager after 5 pm and on weekends at [phone].  ?

## 2021-07-16 NOTE — Progress Notes (Signed)
?   ? ? ? ? ?[facility] for Infectious Disease ? ?Date of Admission:  07/07/2021    ? ? ?Total days of antibiotics 11 ? Vancomycin 4/16 >> current ? Ceftriaxone 4/19 >> current    ? Cefepime 4/16 >> 4/18     ? ? ?ASSESSMENT: ?Tammy Good is a 71 y.o. female with worsening osteomyelitis involving the left calcaneus, cuboid bone and talar bone on imaging. Taken to OR with [name] on 4/24 >> cuboid bone did appear soft c/w infection; no purulence noted in the calcaneocuboid joint, no purulence at the talonavicular joint but did have some inflammation, bone appeared hard; clinically talus bone also appeared healthy. Multiple OR cultures sent (appreciate multiple samples!) and pending for now with no growth < 24h.  ? 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 Would prefer to use vancomycin in consideration of this and worsening condition. Would also continue ceftriaxone given she was on this going into surgery.  ? ?Plan for home PICC line use discussed with Ms. Stork and primary team. We can further adjust antibiotics outpatient if needed based on culture growth.  ? ? ?PLAN: ?OPAT as outlined below.  ?FU in clinic with Dr. Linus Salmons virtually/inperson in 2 weeks to follow with higher risk drug. ? ? ?OPAT ORDERS: ? ?Diagnosis: osteomyelitis left ankle/calcaneous ? ?Culture Result: pending ? ?Allergies  ?Allergen Reactions  ? Strawberry Extract Itching, Swelling and Anaphylaxis  ?  Mouth swells and gets itchy  ? ? ? ?Discharge antibiotics to be given via PICC line: ? ?VANCOMYCIN  Per pharmacy protocol  ?Aim for Vancomycin trough 15-20 or AUC 400-550 (unless otherwise indicated) ? ? + ? ?CEFTRIAXONE 2G IV  ? ?Duration: ?6 weeks   ? ?End Date: ?June 6th  ? ?[facility] Care Per Protocol with [employer] Use: ?Home health RN for IV administration and teaching, line care and labs.   ? ?Labs weekly while on IV antibiotics: ?_x_ CBC with differential ?_x_ BMP **TWICE WEEKLY ON VANCOMYCIN  ?__ CMP ?_x_ CRP ?_x_ ESR ?_x_ Vancomycin trough TWICE WEEKLY ?__ CK ? ?__ Please pull PIC at completion of IV antibiotics ?_x_ Please leave PIC in place until doctor has seen patient or been notified ? ?Fax weekly labs to [phone] ? ?Clinic Follow Up Appt: ?Dr. Linus Salmons  May 12th @ 10:30 am ? ? ? ?Principal Problem: ?  Osteomyelitis of left foot ([place]) ?Active Problems: ?  Panhypopituitarism ([place]) ?  Diabetes mellitus, type 2 ([place]) ?  Essential hypertension ?  PAD (peripheral artery disease) ([place]) ? ? ? atorvastatin  40 mg Oral q1800  ? clopidogrel  75 mg Oral Daily  ? collagenase  1 application. Topical Daily  ? heparin injection (subcutaneous)  5,000 Units Subcutaneous Q8H  ? hydrocortisone  10 mg Oral QHS  ? hydrocortisone  20 mg Oral q AM  ? insulin aspart  0-20 Units Subcutaneous TID WC  ? levothyroxine  125 mcg Oral QAC breakfast  ? midodrine  2.5 mg Oral BID WC  ? mupirocin ointment  1 application. Nasal BID  ? pantoprazole  40 mg Oral BID AC  ? polyethylene glycol  17 g Oral Daily  ? potassium chloride  30 mEq Oral Once  ? sodium chloride  flush  3 mL Intravenous Q12H  ? ? ?SUBJECTIVE: ?She says she feels the best she has in some time. No fevers/chills. No nausea/vomiting.  ?No concerns with current IV antibiotics.  ? ? ?Review of Systems: ?Review of Systems  ?Constitutional:  Negative for chills, fever and malaise/fatigue.  ?Gastrointestinal:  Negative for abdominal pain, diarrhea, nausea and vomiting.  ?Skin:  Negative for rash.  ? ? ?Allergies  ?Allergen Reactions  ? Strawberry Extract Itching, Swelling and Anaphylaxis  ?  Mouth swells and gets itchy  ? ? ?OBJECTIVE: ?Vitals:  ? 07/15/21 1754 07/15/21 2040 07/16/21 0000 07/16/21 0716  ?BP: (!) 145/65 137/78  (!) 131/49 132/62  ?Pulse: 80 73 65 67  ?Resp: '20 20 16 17  ' ?Temp: 97.6 ?F (36.4 ?C) 97.7 ?F (36.5 ?C) 97.8 ?F (36.6 ?C) 98.1 ?F (36.7 ?C)  ?TempSrc: Oral Oral Oral Oral  ?SpO2: 100% 97% 99% 99%  ?Weight:      ?Height:      ? ?Body mass index is 30.01 kg/m?. ? ?Physical Exam ?Vitals reviewed.  ?Constitutional:   ?   Appearance: She is well-developed.  ?   Comments: Seated on side of bed comfortably in no distress.   ?HENT:  ?   Mouth/Throat:  ?   Mouth: Mucous membranes are moist. No oral lesions.  ?   Dentition: Normal dentition. No dental abscesses.  ?   Pharynx: Oropharynx is clear. No oropharyngeal exudate.  ?Cardiovascular:  ?   Rate and Rhythm: Normal rate and regular rhythm.  ?Pulmonary:  ?   Effort: Pulmonary effort is normal.  ?Musculoskeletal:  ?   Comments: LLE wrapped in clean ACE bandage from OR  ?Lymphadenopathy:  ?   Cervical: No cervical adenopathy.  ?Skin: ?   General: Skin is warm and dry.  ?   Findings: No rash.  ?Neurological:  ?   Mental Status: She is alert and oriented to person, place, and time.  ?Psychiatric:     ?   Judgment: Judgment normal.  ? ? ?Lab Results ?Lab Results  ?Component Value Date  ? WBC 6.4 07/16/2021  ? HGB 8.6 (L) 07/16/2021  ? HCT 28.1 (L) 07/16/2021  ? MCV 87.5 07/16/2021  ? PLT 452 (H) 07/16/2021  ?  ?Lab Results  ?Component Value Date  ? CREATININE 0.73 07/16/2021  ? BUN <5 (L) 07/16/2021  ? NA 137 07/16/2021  ? K 3.5 07/16/2021  ? CL 106 07/16/2021  ? CO2 27 07/16/2021  ?  ?Lab Results  ?Component Value Date  ? ALT 11 07/07/2021  ? AST 17 07/07/2021  ? ALKPHOS 97 07/07/2021  ? BILITOT 0.9 07/07/2021  ?  ? ?Microbiology: ?Recent Results (from the past 240 hour(s))  ?Blood Culture (routine x 2)     Status: None  ? Collection Time: 07/07/21  3:25 AM  ? Specimen: BLOOD  ?Result Value Ref Range Status  ? Specimen Description BLOOD RIGHT ANTECUBITAL  Final  ? Special Requests   Final  ?  BOTTLES DRAWN AEROBIC AND ANAEROBIC Blood Culture results may not be optimal due to an  excessive volume of blood received in culture bottles  ? Culture   Final  ?  NO GROWTH 5 DAYS ?Performed at [facility], [place] [address] [place], [place] [address] ?  ? Report Status 07/12/2021 FINAL  Final  ?MRSA Next Gen by PCR, Nasal     Status: Abnormal  ? Collection Time: 07/09/21 12:18 PM  ? Specimen: Nasal Mucosa; Nasal Swab  ?  Result Value Ref Range Status  ? MRSA by PCR Next Gen DETECTED (A) NOT DETECTED Final  ?  Comment: RESULTS CALLED TO READ BACK BY AND VERFIED WITH RN K.BIRD '@1429'  ON 07/09/2021 BY NM ?(NOTE) ?The GeneXpert MRSA Assay (FDA approved for NASAL specimens only), ?is one component of a comprehensive MRSA colonization surveillance ?program. It is not intended to diagnose MRSA infection nor to guide ?or monitor treatment for MRSA infections. ?Test performance is not FDA approved in patients less than 2 years ?old. ?Performed at [facility], [place] [address] [place], [place] ?[address] ?  ?Surgical PCR screen     Status: Abnormal  ? Collection Time: 07/15/21  7:44 AM  ? Specimen: Nasal Mucosa; Nasal Swab  ?Result Value Ref Range Status  ? MRSA, PCR POSITIVE (A) NEGATIVE Final  ?  Comment: RESULT CALLED TO, READ BACK BY AND VERIFIED WITH: ?RN [facility] ON [id number] AT 1053 BY E.PARRISH ?  ? Staphylococcus aureus POSITIVE (A) NEGATIVE Final  ?  Comment: (NOTE) ?The Xpert SA Assay (FDA approved for NASAL specimens in patients 33 ?years of age and older), is one component of a comprehensive ?surveillance program. It is not intended to diagnose infection nor to ?guide or monitor treatment. ?Performed at [facility], [place] [address] [place], [place] ?[address] ?  ?Aerobic/Anaerobic Culture w Gram Stain (surgical/deep wound)     Status: None (Preliminary result)  ? Collection Time: 07/15/21 12:29 PM  ? Specimen: PATH Bone biopsy; Tissue  ?Result Value Ref Range Status  ? Specimen Description TISSUE  Final  ? Special Requests LEFT CALCAENOUS SPEC A  Final  ? Gram Stain NO WBC SEEN ?NO  ORGANISMS SEEN ?  Final  ? Culture   Final  ?  NO GROWTH < 24 HOURS ?Performed at [facility], [place] [address], [place], [place] [address] ?  ? Report Status PENDING  Incomplete  ?Aerobic/Anaerobic Cu

## 2021-07-16 NOTE — Progress Notes (Signed)
Orthopedic Tech Progress Note ?Patient Details:  ?KYNLEE KOENIGSBERG ?[birth year] ?[id number] ? ?Ortho Devices ?Type of Ortho Device: Postop shoe/boot ?Ortho Device/Splint Location: LLE ?Ortho Device/Splint Interventions: Ordered ?  ?Post Interventions ?Patient Tolerated: Well ?Instructions Provided: Care of device ? ?Janit Pagan ?07/16/2021, 3:01 PM ? ?

## 2021-07-16 NOTE — Progress Notes (Signed)
? ?  S: Reports doing very well this morning, no nausea or vomiting, eating much better, was able to ambulate out of the hallway with no pain in the left foot. ? ?O:  ? ?Vitals:  ? 07/16/21 1237 07/16/21 1244  ?BP: 114/60 128/69  ?Pulse: 100 (!) 110  ?Resp:    ?Temp:    ?SpO2:    ? ?Gen: Well-appearing woman in bed, no distress ?Ext: Warm and well-perfused, left ankle is wrapped postoperatively ?Psych: Appropriate mood and affect, not anxious or depressed appearing ? ?A/P: ? ?Principal Problem: ?  Osteomyelitis of left foot ([place]) ?Active Problems: ?  Panhypopituitarism ([place]) ?  Diabetes mellitus, type 2 ([place]) ?  Essential hypertension ?  PAD (peripheral artery disease) ([place]) ? 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  Planning for prolonged course of IV vancomycin and ceftriaxone.  Operative bone biopsies were obtained yesterday by podiatry.  Patient will follow-up with Dr. Bradd Burner in the infectious disease clinic.  Planning to place PICC today or tomorrow, then arrange for outpatient antibiotics.  She will also follow-up with podiatry and [name] as needed. ? ?Peripheral artery disease: Angiogram with treatment completed on 4/20.  Patient doing very well postprocedure.  Continuing medical therapy with Plavix 75 mg daily and atorvastatin 40 mg daily. ? ?Adrenal insufficiency: Symptomatically doing better after increasing to hydrocortisone 20 in the morning and 10 in the evening.  Prior to this illness at home she was taking dexamethasone 0.5 mg once daily.  I think after discharge we can transition back to dexamethasone 1 mg daily, she will follow-up with her primary care physician and can hopefully go back down to the 0.5 mg dose in 1-2 weeks as she recovers from this acute illness. ? ?Diabetes: Was using 42 units of long-acting insulin daily at home, however has not required any insulin for last 4 days.  Had some  hypoglycemia during this hospitalization.  Likely related to low oral intake.  I think we will advised the patient to hold insulin at home but check fingerstick glucose and if she notices her morning glucose above 180 to restart 20 units of long-acting insulin.  She will follow-up with her primary care physician for further dose changes as needed. ? ?She is anticoagulated with apixaban for atrial fibrillation and DVT prophylaxis. ? ?Regular diet ? ?Anticipate discharge once PICC can be placed and outpatient antibiotics can be arranged.  PT is recommending home health follow-up which I agree with. ? ?Axel Filler, MD ?07/16/2021, 2:32 PM ? ?

## 2021-07-17 DIAGNOSIS — M86272 Subacute osteomyelitis, left ankle and foot: Secondary | ICD-10-CM | POA: Diagnosis not present

## 2021-07-17 LAB — BASIC METABOLIC PANEL
Anion gap: 5 (ref 5–15)
BUN: <5 mg/dL — ABNORMAL LOW (ref 8–23)
CO2: 29 mmol/L (ref 22–32)
Calcium: 8.1 mg/dL — ABNORMAL LOW (ref 8.9–10.3)
Chloride: 104 mmol/L (ref 98–111)
Creatinine, Ser: 0.76 mg/dL (ref 0.44–1.00)
GFR, Estimated: >60 mL/min (ref >60)
Glucose, Bld: 146 mg/dL — ABNORMAL HIGH (ref 70–99)
Potassium: 3.8 mmol/L (ref 3.5–5.1)
Sodium: 138 mmol/L (ref 135–145)

## 2021-07-17 LAB — CBC
HCT: 28.5 % — ABNORMAL LOW (ref 36.0–46.0)
Hemoglobin: 8.9 g/dL — ABNORMAL LOW (ref 12.0–15.0)
MCH: 27.1 pg (ref 26.0–34.0)
MCHC: 31.2 g/dL (ref 30.0–36.0)
MCV: 86.6 fL (ref 80.0–100.0)
Platelets: 472 10*3/uL — ABNORMAL HIGH (ref 150–400)
RBC: 3.29 MIL/uL — ABNORMAL LOW (ref 3.87–5.11)
RDW: 19.7 % — ABNORMAL HIGH (ref 11.5–15.5)
WBC: 7.3 10*3/uL (ref 4.0–10.5)
nRBC: 0 % (ref 0.0–0.2)

## 2021-07-17 LAB — GLUCOSE, CAPILLARY
Glucose-Capillary: 139 mg/dL — ABNORMAL HIGH (ref 70–99)
Glucose-Capillary: 178 mg/dL — ABNORMAL HIGH (ref 70–99)
Glucose-Capillary: 211 mg/dL — ABNORMAL HIGH (ref 70–99)

## 2021-07-17 LAB — SURGICAL PATHOLOGY

## 2021-07-17 LAB — VANCOMYCIN, PEAK: Vancomycin Pk: 34 ug/mL (ref 30–40)

## 2021-07-17 LAB — ACID FAST SMEAR (AFB, MYCOBACTERIA): Acid Fast Smear: NEGATIVE

## 2021-07-17 LAB — VANCOMYCIN, TROUGH: Vancomycin Tr: 12 ug/mL — ABNORMAL LOW (ref 15–20)

## 2021-07-17 MED ORDER — CEFTRIAXONE IV (FOR PTA / DISCHARGE USE ONLY): 2.0000 g | INTRAVENOUS | 0 refills | Status: AC

## 2021-07-17 MED ORDER — SODIUM CHLORIDE 0.9% FLUSH: 10.0000 mL | Freq: Two times a day (BID) | INTRAVENOUS | Status: DC

## 2021-07-17 MED ORDER — SODIUM CHLORIDE 0.9% FLUSH
10.0000 mL | INTRAVENOUS | Status: DC | PRN
  Administered 2021-07-17: 10 mL

## 2021-07-17 MED ORDER — CHLORHEXIDINE GLUCONATE CLOTH 2 % EX PADS
6.0000 | MEDICATED_PAD | Freq: Every day | CUTANEOUS | Status: DC
  Administered 2021-07-17: 6 via TOPICAL

## 2021-07-17 MED ORDER — HEPARIN SOD (PORK) LOCK FLUSH 100 UNIT/ML IV SOLN
250.0000 [IU] | INTRAVENOUS | Status: AC | PRN
  Administered 2021-07-17: 250 [IU]
  Filled 2021-07-17: qty 2.5

## 2021-07-17 MED ORDER — VANCOMYCIN IV (FOR PTA / DISCHARGE USE ONLY): 1000.0000 mg | INTRAVENOUS | 0 refills | Status: AC

## 2021-07-17 MED ORDER — MIDODRINE HCL 2.5 MG PO TABS: 2.5000 mg | ORAL_TABLET | Freq: Two times a day (BID) | ORAL | 0 refills | Status: DC

## 2021-07-17 MED ORDER — CLOPIDOGREL BISULFATE 75 MG PO TABS
75.0000 mg | ORAL_TABLET | Freq: Every day | ORAL | 0 refills | Status: DC
Start: 2021-07-18 — End: 2021-08-06

## 2021-07-17 MED ORDER — DEXAMETHASONE 0.5 MG PO TABS: 1.0000 mg | ORAL_TABLET | Freq: Every day | ORAL | 0 refills | Status: DC

## 2021-07-17 NOTE — Progress Notes (Signed)
Pharmacy Antibiotic Note ? ?Tammy Good is a 72 y.o. female admitted on 07/07/2021 with L-ankle infection/osteo. Pharmacy has been consulted for Vancomycin dosing along wit Rocephin per MD. ? ?A Vancomycin peak/trough was drawn today and resulted as 12 mcg/ml and 34 mcg/ml respectively for a calculated AUC of 530 which is therapeutic. ? ?Noted plan for PICC and discharge - will finalize and pend OPAT orders. Planning 6 weeks thru 6/6. ? ?Plan: ?- Continue Vancomycin 1g IV every 24 hours ?- Continue Rocephin 2g IV every 24 hours ?- Noted plans for d/c today with OPAT orders ? ?Height: '5\' 7"'$  (170.2 cm) ?Weight: 86.9 kg (191 lb 9.3 oz) ?IBW/kg (Calculated) : 61.6 ? ?Temp (24hrs), Avg:98.3 ?F (36.8 ?C), Min:98 ?F (36.7 ?C), Max:98.6 ?F (37 ?C) ? ?Recent Labs  ?Lab 07/10/21 ?1330 07/11/21 ?0628 07/11/21 ?1230 07/12/21 ?0118 07/13/21 ?0114 07/14/21 ?0165 07/15/21 ?0206 07/16/21 ?0017 07/17/21 ?0125  ?WBC  --    < >  --    < > 9.7 8.6 7.2 6.4 7.3  ?CREATININE  --    < >  --    < > 0.72 0.72 0.75 0.73 0.76  ?[place]  --   --  14*  --   --   --   --   --   --   ?VANCOPEAK 41*  --   --   --   --   --   --   --   --   ? < > = values in this interval not displayed.  ?  ?Estimated Creatinine Clearance: 75.1 mL/min (by C-G formula based on SCr of 0.76 mg/dL).   ? ?Allergies  ?Allergen Reactions  ? Strawberry Extract Itching, Swelling and Anaphylaxis  ?  Mouth swells and gets itchy  ? ? ?Antimicrobials this admission: ?Vanc 4/16 >> ?Cefepime 4/16 >> 4/18 ?Ceftriaxone 4/19 >> ? ?Dose adjustments this admission: ? ? ?Microbiology results: ?4/18 MRSA PCR >> positive ?4/16 BCx >> NGf ?4/24 L-ankle tissue A >> ng<24h ?4/24 L-ankle tissue B >> ng<24h ?4/24 L-ankle tissue C >> ng<24h ? ?Thank you for allowing pharmacy to be a part of this patient?s care. ? ?Alycia Rossetti, PharmD, BCPS ?Infectious Diseases Clinical Pharmacist ?07/17/2021 8:27 AM  ? ?**Pharmacist phone directory can now be found on [email or web address] ([email or web address]).  Listed  under [place].  ? ?

## 2021-07-17 NOTE — Progress Notes (Signed)
D/c PIV and tele. Went over AVS with pt and all questions were addressed.  ? ?Lavenia Atlas, RN ? ?

## 2021-07-17 NOTE — Discharge Summary (Signed)
? ?Name: Tammy Good ?MRN: [id number] ?DOB: Apr 07, 1950 71 y.o. ?PCP: [name], MD ? ?Date of Admission: 07/07/2021  2:23 AM ?Date of Discharge: 07/17/21 ?Attending Physician: Dr. Evette Doffing ? ?Discharge Diagnosis: ?Principal Problem: ?  Osteomyelitis of left foot ([place]) ?Active Problems: ?  Panhypopituitarism ([place]) ?  Diabetes mellitus, type 2 ([place]) ?  Essential hypertension ?  PAD (peripheral artery disease) ([place]) ?  ? ?Discharge Medications: ?Allergies as of 07/17/2021   ? ?   Reactions  ? Strawberry Extract Itching, Swelling, Anaphylaxis  ? Mouth swells and gets itchy  ? ?  ? ?  ?Medication List  ?  ? ?STOP taking these medications   ? ?Basaglar KwikPen 100 UNIT/ML ?  ?linezolid 600 MG tablet ?Commonly known as: Zyvox ?  ?nitroGLYCERIN 0.4 MG SL tablet ?Commonly known as: Nitrostat ?  ? ?  ? ?TAKE these medications   ? ?acetaminophen 500 MG tablet ?Commonly known as: TYLENOL ?Take 500-1,000 mg by mouth 3 (three) times daily as needed for headache (pain). ?  ?apixaban 5 MG Tabs tablet ?Commonly known as: ELIQUIS ?Take 1 tablet (5 mg total) by mouth 2 (two) times daily. ?  ?atorvastatin 40 MG tablet ?Commonly known as: LIPITOR ?Take 1 tablet (40 mg total) by mouth daily at 6 PM. ?What changed: when to take this ?  ?B-D UF III MINI PEN NEEDLES 31G X 5 MM Misc ?Generic drug: Insulin Pen Needle ?Inject into the skin. ?  ?cefTRIAXone  IVPB ?Commonly known as: ROCEPHIN ?Inject 2 g into the vein daily. Indication:  L-ankle osteo ?First Dose: Yes ?Last Day of Therapy:  08/27/21 ?Labs - Once weekly:  CBC/D and BMP, ?Labs - Every other week:  ESR and CRP ?Method of administration: IV Push ?Method of administration may be changed at the discretion of home infusion pharmacist based upon assessment of the patient and/or caregiver's ability to self-administer the medication ordered. ?  ?clopidogrel 75 MG tablet ?Commonly known as: PLAVIX ?Take 1 tablet (75 mg total) by mouth daily. ?Start taking on: July 18, 2021 ?   ?dapagliflozin propanediol 10 MG Tabs tablet ?Commonly known as: FARXIGA ?Take 10 mg by mouth every morning. ?  ?dexamethasone 0.5 MG tablet ?Commonly known as: Decadron ?Take 2 tablets (1 mg total) by mouth daily. ?What changed:  ?how much to take ?when to take this ?  ?ferrous sulfate 325 (65 FE) MG tablet ?Take 325 mg by mouth every evening. ?  ?Fish Oil 1000 MG Caps ?Take 1,000 mg by mouth every morning. ?  ?FreeStyle Libre 2 Sensor Misc ?Apply topically every 14 (fourteen) days. ?  ?furosemide 20 MG tablet ?Commonly known as: Lasix ?Take 1 tablet (20 mg total) by mouth daily as needed for fluid or edema. ?  ?levothyroxine 125 MCG tablet ?Commonly known as: SYNTHROID ?Take 1 tablet (125 mcg total) by mouth daily before breakfast. ?  ?metoCLOPramide 5 MG tablet ?Commonly known as: Reglan ?Take 1 tablet (5 mg total) by mouth 3 (three) times daily as needed for nausea or vomiting. Take before meals as needed, if having nausea ?  ?midodrine 2.5 MG tablet ?Commonly known as: PROAMATINE ?Take 1 tablet (2.5 mg total) by mouth 2 (two) times daily with a meal. ?Start taking on: July 18, 2021 ?What changed:  ?medication strength ?how much to take ?  ?NovoLOG FlexPen 100 UNIT/ML FlexPen ?Generic drug: insulin aspart ?Inject 3-8 Units into the skin See admin instructions. Inject 3-8 units subcutaneously up to three times daily per sliding scale -  based on carb count ?  ?nystatin cream ?Commonly known as: MYCOSTATIN ?Apply 1 application topically 2 (two) times daily. ?What changed:  ?when to take this ?reasons to take this ?  ?pantoprazole 40 MG tablet ?Commonly known as: Protonix ?Take 1 tablet (40 mg total) by mouth 2 (two) times daily before a meal. ?  ?polyethylene glycol powder 17 GM/SCOOP powder ?Commonly known as: GLYCOLAX/MIRALAX ?Take 1 capful with water (17 g) by mouth daily. ?  ?promethazine 12.5 MG tablet ?Commonly known as: PHENERGAN ?Take 1 tablet (12.5 mg total) by mouth every 6 (six) hours as needed for  nausea, vomiting or refractory nausea / vomiting. ?  ?Santyl 250 UNIT/GM ointment ?Generic drug: collagenase ?Apply 1 application. topically daily. ?What changed: additional instructions ?  ?Systane Complete 0.6 % Soln ?Generic drug: Propylene Glycol ?Place 1 drop into both eyes daily as needed (dry eyes). ?  ?vancomycin  IVPB ?Inject 1,000 mg into the vein daily. Indication:  L-ankle oste ?First Dose: Yes ?Last Day of Therapy:  08/27/21 ?Labs - Sunday/Monday:  CBC/D, BMP, and vancomycin trough. ?Labs - Thursday:  BMP and vancomycin trough ?Labs - Every other week:  ESR and CRP ?Method of administration:Elastomeric ?Method of administration may be changed at the discretion of the patient and/or caregiver's ability to self-administer the medication ordered. ?  ? ?  ? ?  ?  ? ? ?  ?Durable Medical Equipment  ?(From admission, onward)  ?  ? ? ?  ? ?  Start     Ordered  ? 07/17/21 1110  For home use only DME Walker rolling  Once       ?Question Answer Comment  ?Walker: With 5 Inch Wheels   ?Patient needs a walker to treat with the following condition Weakness generalized   ?  ? 07/17/21 1109  ? ?  ?  ? ?  ? ? ?  ?Discharge Care Instructions  ?(From admission, onward)  ?  ? ? ?  ? ?  Start     Ordered  ? 07/17/21 0000  Change dressing on IV access line weekly and PRN  (Home infusion instructions - [facility] )       ? 07/17/21 1738  ? 07/17/21 0000  Discharge wound care:       ?Comments: Dressing in place and to follow-up with [name] next week.  ? 07/17/21 1738  ? ?  ?  ? ?  ? ? ?Disposition and follow-up:   ?Tammy Good was discharged from [facility] in Stable condition.  At the hospital follow up visit please address: ? ?1.  Follow-up: ?a.  Acute on chronic osteomyelitis-she will be following up with podiatry and infectious disease closely.  She is at high risk for needing amputation despite continuing with antibiotics.  This was discussed with patient and family and she wanted  to try all possible therapies to conserve her foot. ?  ?b.  Adrenal insufficiency-she had hypoglycemia due to decreased p.o. intake.  Steroid dosing increased to 1 mg dexamethasone.  She was instructed to following up with her primary care provider within 2 weeks. ? ?c.  Insulin-dependent diabetes-decreased p.o. intake, long-acting insulin was held at discharge.  She was asked to continue checking her morning blood sugars and if greater than 180, instructed to resume glargine at 20 units dosing. ? ?D.  Stent was placed in left leg.  Plavix was started and continued on discharge. ?Follow-up with vascular surgery team in 1 month. ? ?  E.  Active thrombocytosis ? ?2.  Labs / imaging needed at time of follow-up: BMP, CBC ? ?3.  Pending labs/ test needing follow-up: Fungus culture with stain, aerobic/anaerobic culture, acid-fast culture ? ?4.  Medication Changes ? Started:  ?Plavix 75 mg daily ? Stopped:  ?2. Glargine 42 units ? Changed:  ?3. Dexamethasone 30m qd ? Abx -  Vancomycin, ceftriaxone End Date: June 6 ? ?Follow-up Appointments: ? Follow-up Information   ? ? [place] [facility].   ?Why: Someone will call you to schedule first home visit.- [facility] to resume- they will coordinate with [facility] for home IV abx infusion needs ?Contact information: ?[address] ?[place]?[phone]? ? ?  ?  ? ? [facility] Follow up in 1 month(s).   ?Why: The office will call the patient with an appointment (sent) ?Contact information: ?[address] ?[place]?[phone]? ?  ?  ? ? DNewt Minion MD Follow up in 1 week(s).   ?Specialty: Orthopedic Surgery ?Contact information: ?[address]?[place]?[phone]? ? ?  ?  ? ? CThayer Headings MD Follow up on 08/02/2021.   ?Specialty: Infectious Diseases ?Why: 10:30 am appointment - can be virtual / telephone if preferred, just give our office a call to convert  the visit to your preference., Hospital Discharge Follow Up ?Contact information: ?[address] ?[address] ?[place]?[phone]? ? ?  ?  ? ?  ?  ? ?  ? ? ?Hospital Course by problem list: ?Osteomy

## 2021-07-17 NOTE — TOC Transition Note (Signed)
Transition of Care (TOC) - CM/SW Discharge Note ?Marvetta Gibbons Therapist, sports, BSN ?Transitions of Care ?Unit 4E- RN Case Manager ?See Treatment Team for direct phone #  ? ? ?Patient Details  ?Name: Tammy Good ?MRN: [id number] ?Date of Birth: 06-Aug-1950 ? ?Transition of Care (TOC) CM/SW Contact:  ?Dahlia Client, Romeo Rabon, RN ?Phone Number: ?07/17/2021, 2:40 PM ? ? ?Clinical Narrative:    ?PICC line placed this AM, plan is for pt to d/c later this afternoon after lab draw for Vanc trough at 4pm.  ?Have spoken with Tammy Good and Tammy Good for [facility] and home infusion needs- both are alert for d/c later today and working to have all arrangements in place. Per Tammy Good plan will be to have abx mixed and delivered tomorrow after dose is confirmed post lab draw (pending new OPAT if needed). Tammy Good following closely with ID for OPAT needs and home IV abx delivery.  ?Tammy Good with [facility] is scheduling [place] visit for tomorrow for [facility] needs.  ? ?Per bedside RN pt is requesting RW for home- spoke with [facility] and confirmed pt received both RW and BSC on Mar. 8 of this year- insurance will not cover RW at this time again. Call made to pt over phone in room to clarify- pt was actually wanting a rollator- explained that medicare would not cover rollator at this time as she just received RW last Month. Pt voiced understanding and does not wish to pay out of pocket at this time for rollator. No DME to be delivered.  ? ?1400- Saw Tammy Good on unit and confirmed all is ready on home infusion end for discharge once lab is drawn and d/c orders placed. Anticipate d/c after 5pm ? ? ?Final next level of care: [place] ?Barriers to Discharge: Barriers Resolved ? ? ?Patient Goals and CMS Choice ?Patient states their goals for this hospitalization and ongoing recovery are:: To return home ?CMS Medicare.gov Compare Post Acute Care list provided to:: Patient ?Choice offered to / list presented to : Patient ? ?Discharge Placement ?  ?           ?  ? Home w/ HH ?  ?   ? ?Discharge Plan and Services ?  ?Discharge Planning Services: CM Consult ?Post Acute Care Choice: Home Health          ?DME Arranged: N/A ?DME Agency: [facility] ?Date DME Agency Contacted: 07/17/21 ?Time DME Agency Contacted: [phone] ?Representative spoke with at DME Agency: Tammy Good ?HH Arranged: RN, PT, OT, Nurse's Aide, Social Work, IV Antibiotics ?[place] Agency: [place] ([facility]), [facility] ?Date HH Agency Contacted: 07/16/21 ?Time [place]: 1000 ?Representative spoke with at [place]: Tammy Good ? ?Social Determinants of Health (SDOH) Interventions ?  ? ? ?Readmission Risk Interventions ? ?  07/17/2021  ?  2:40 PM 06/03/2021  ? 12:08 PM 05/30/2019  ?  1:15 PM  ?Readmission Risk Prevention Plan  ?Transportation Screening Complete Complete Complete  ?PCP or Specialist Appt within 5-7 Days   Complete  ?Home Care Screening   Complete  ?Medication Review (RN CM)   Complete  ?Medication Review Press photographer) Complete Complete   ?PCP or Specialist appointment within 3-5 days of discharge Complete Complete   ?[place] or Home Care Consult Complete Complete   ?SW Recovery Care/Counseling Consult Complete Complete   ?Palliative Care Screening Not Applicable Not Applicable   ?[place] Not Applicable Not Applicable   ? ? ? ? ? ?

## 2021-07-17 NOTE — Progress Notes (Signed)
Peripherally Inserted Central Catheter Placement ? ?The IV Nurse has discussed with the patient and/or persons authorized to consent for the patient, the purpose of this procedure and the potential benefits and risks involved with this procedure.  The benefits include less needle sticks, lab draws from the catheter, and the patient may be discharged home with the catheter. Risks include, but not limited to, infection, bleeding, blood clot (thrombus formation), and puncture of an artery; nerve damage and irregular heartbeat and possibility to perform a PICC exchange if needed/ordered by physician.  Alternatives to this procedure were also discussed.  Bard Power PICC patient education guide, fact sheet on infection prevention and patient information card has been provided to patient /or left at bedside.   ? ?PICC Placement Documentation  ?PICC Single Lumen 89/37/34 Right Basilic 42 cm 0 cm (Active)  ?Indication for Insertion or Continuance of Line Home intravenous therapies (PICC only) 07/17/21 0928  ?Exposed Catheter (cm) 0 cm 07/17/21 0928  ?Site Assessment Clean, Dry, Intact 07/17/21 0928  ?Line Status Flushed;Saline locked;Blood return noted 07/17/21 0928  ?Dressing Type Transparent;Securing device 07/17/21 0928  ?Dressing Status Antimicrobial disc in place;Clean, Dry, Intact 07/17/21 0928  ?Line Adjustment (NICU/IV Team Only) No 07/17/21 0928  ?Dressing Intervention New dressing;Other (Comment) 07/17/21 2876  ?Dressing Change Due 07/24/21 07/17/21 0928  ? ? ? ? ? ?Enos Fling ?07/17/2021, 9:31 AM ? ?

## 2021-07-17 NOTE — Progress Notes (Signed)
Pharmacy Antibiotic Note ? ?Tammy Good is a 71 y.o. female admitted on 07/07/2021 osteomyelitis. 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 Pharmacy has been consulted for Vancomycin/ceftriaxone dosing. ? ?Pt s/p I&D and biopsy 4/24. Planning for ceftriaxone and vancomycin until 6/6 per ID. Cr has remained stable. Will defer levels as it appears pt will discharge today. ? ?Plan: ?-Continue vancomycin to '1000mg'$  q24hr (AUC of 535.5) ?-Continue ceftriaxone 2gm q24hr ?-Check levels as an outpatient ? ? ?Height: '5\' 7"'$  (170.2 cm) ?Weight: 86.9 kg (191 lb 9.3 oz) ?IBW/kg (Calculated) : 61.6 ? ?Temp (24hrs), Avg:98.3 ?F (36.8 ?C), Min:98 ?F (36.7 ?C), Max:98.6 ?F (37 ?C) ? ?Recent Labs  ?Lab 07/10/21 ?1330 07/11/21 ?0628 07/11/21 ?1230 07/12/21 ?0118 07/13/21 ?0114 07/14/21 ?0962 07/15/21 ?0206 07/16/21 ?0017 07/17/21 ?0125  ?WBC  --    < >  --    < > 9.7 8.6 7.2 6.4 7.3  ?CREATININE  --    < >  --    < > 0.72 0.72 0.75 0.73 0.76  ?[place]  --   --  14*  --   --   --   --   --   --   ?VANCOPEAK 41*  --   --   --   --   --   --   --   --   ? < > = values in this interval not displayed.  ? ?  ?Estimated Creatinine Clearance: 75.1 mL/min (by C-G formula based on SCr of 0.76 mg/dL).   ? ?Allergies  ?Allergen Reactions  ? Strawberry Extract Itching, Swelling and Anaphylaxis  ?  Mouth swells and gets itchy  ? ? ?Thank you for allowing pharmacy to participate in this patient's care. ? ? ?Arrie Senate, PharmD, BCPS, BCCP ?Clinical Pharmacist ?[phone] ?Please check AMION for all [place] numbers ?07/17/2021 ? ? ? ? ? ?

## 2021-07-17 NOTE — Progress Notes (Signed)
Occupational Therapy Treatment ?Patient Details ?Name: Tammy Good ?MRN: [id number] ?DOB: February 20, 1951 ?Today's Date: 07/17/2021 ? ? 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 Pt underwent anteriogram with L LE stent placements on 4/20. Plan for bone marrow biopsy and I&D of L LE wound on 4/24. 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 ?  ?OT comments ? Pt completed ADLs and ADL transfers modified independently. Pt aware she may not shower with PICC line. Eager to go home later today.   ? ?Recommendations for follow up therapy are one component of a multi-disciplinary discharge planning process, led by the attending physician.  Recommendations may be updated based on patient status, additional functional criteria and insurance authorization. ?   ?Follow Up Recommendations ? No OT follow up  ?  ?Assistance Recommended at Discharge PRN  ?Patient can return home with the following ? Assist for transportation;Help with stairs or ramp for entrance ?  ?Equipment Recommendations ? None recommended by OT  ?  ?Recommendations for Other Services   ? ?  ?Precautions / Restrictions Precautions ?Precautions: Fall ?Precaution Comments: Contact -MRSA ?Restrictions ?Weight Bearing Restrictions: No  ? ? ?  ? ?Mobility Bed Mobility ?Overal bed mobility: Modified Independent ?  ?  ?  ?  ?  ?  ?  ?  ? ?Transfers ?Overall transfer level: Modified independent ?  ?  ?  ?  ?  ?  ?  ?  ?  ?  ?  ?Balance Overall balance assessment: Needs assistance ?  ?Sitting balance-Leahy Scale: Normal ?  ?  ?  ?Standing balance-Leahy Scale: Good ?  ?  ?  ?  ?  ?  ?  ?  ?  ?  ?  ?  ?   ? ?ADL either performed or assessed with clinical judgement  ? ?ADL Overall ADL's : Needs assistance/impaired ?  ?  ?  ?  ?  ?  ?  ?  ?  ?  ?Lower Body Dressing: Set up;Sitting/lateral leans ?  ?Toilet Transfer: Modified Independent;Stand-pivot;BSC/3in1 ?  ?Toileting-  Clothing Manipulation and Hygiene: Modified independent;Sit to/from stand ?  ?  ?  ?  ?  ?  ? ?Extremity/Trunk Assessment   ?  ?  ?  ?  ?  ? ?Vision   ?  ?  ?Perception   ?  ?Praxis   ?  ? ?Cognition Arousal/Alertness: Awake/alert ?Behavior During Therapy: [facility] for tasks assessed/performed ?Overall Cognitive Status: Within Functional Limits for tasks assessed ?  ?  ?  ?  ?  ?  ?  ?  ?  ?  ?  ?  ?  ?  ?  ?  ?  ?  ?  ?   ?Exercises   ? ?  ?Shoulder Instructions   ? ? ?  ?General Comments    ? ? ?Pertinent Vitals/ Pain       Pain Assessment ?Pain Assessment: No/denies pain ? ?Home Living   ?  ?  ?  ?  ?  ?  ?  ?  ?  ?  ?  ?  ?  ?  ?  ?  ?  ?  ? ?  ?Prior Functioning/Environment    ?  ?  ?  ?   ? ?Frequency ? Min 2X/week  ? ? ? ? ?  ?Progress Toward Goals ? ?OT Goals(current goals  can now be found in the care plan section) ? Progress towards OT goals: Progressing toward goals ? ?Acute Rehab OT Goals ?OT Goal Formulation: With patient ?Time For Goal Achievement: 07/23/21 ?Potential to Achieve Goals: Good  ?Plan Discharge plan remains appropriate   ? ?Co-evaluation ? ? ?   ?  ?  ?  ?  ? ?  ?AM-PAC OT "6 Clicks" Daily Activity     ?Outcome Measure ? ? Help from another person eating meals?: None ?Help from another person taking care of personal grooming?: None ?Help from another person toileting, which includes using toliet, bedpan, or urinal?: None ?Help from another person bathing (including washing, rinsing, drying)?: None ?Help from another person to put on and taking off regular upper body clothing?: None ?Help from another person to put on and taking off regular lower body clothing?: None ?6 Click Score: 24 ? ?  ?End of Session   ? ?OT Visit Diagnosis: Other abnormalities of gait and mobility (R26.89) ?  ?Activity Tolerance Patient tolerated treatment well ?  ?Patient Left in bed;with call bell/phone within reach ?  ?Nurse Communication   ?  ? ?   ? ?Time: 7847-8412 ?OT Time Calculation (min): 14 min ? ?Charges: OT  General Charges ?$OT Visit: 1 Visit ?OT Treatments ?$Self Care/Home Management : 8-22 mins ? ?Tammy Good, OTR/L ?Acute Rehabilitation Services ?Pager: [phone] ?Office: [phone]  ? ?Tammy Good ?07/17/2021, 2:51 PM ?

## 2021-07-17 NOTE — Progress Notes (Signed)
?  Subjective:  ?Patient ID: Tammy Good, female    DOB: 1951-02-01,  MRN: [id number] ? ? [name] presents with concern for osteomyelitis to the left foot status post left foot bone biopsy with irrigation debridement.  She states she is doing well.  She was seen by infectious disease who recommended PICC line and on long-term antibiotics.  No foot problems.  She is weightbearing as tolerated to the left foot ? ?Objective:  ? ?Vitals:  ? 07/17/21 0733 07/17/21 1052  ?BP: (!) 153/70 (!) 148/69  ?Pulse: 71 82  ?Resp: 18 17  ?Temp: 98.6 ?F (37 ?C) 98.4 ?F (36.9 ?C)  ?SpO2: 99% 100%  ? ?General AA&O x3. Normal mood and affect.  ?Vascular Dorsalis pedis and posterior tibial pulses 2/4 bilat. ?Brisk capillary refill to all digits. Pedal hair present.  ?Neurologic Epicritic sensation grossly intact.  ?Dermatologic Bandages clean dry and intact.  No calf pain noted motor or sensory functions are intact.  ?Orthopedic: MMT 5/5 in dorsiflexion, plantarflexion, inversion, and eversion. ?Normal joint ROM without pain or crepitus.  ? ? ?Assessment & Plan:  ?Patient was evaluated and treated and all questions answered. ? 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  Patient will follow up with [name] 1 week from discharge. ?-Weightbearing as tolerated to the left lower extremity ?-Antibiotics per infectious disease ?-Patient is okay to be discharged from podiatric standpoint.  No further surgical plans indicated. ? ?Felipa Furnace, DPM ? ?Accessible via secure chat for questions or concerns. ? ?

## 2021-07-17 NOTE — Progress Notes (Signed)
PHARMACY CONSULT NOTE FOR: ? ?OUTPATIENT  PARENTERAL ANTIBIOTIC THERAPY (OPAT) ? ?Indication: L-ankle osteo  ?Regimen: Vancomycin 1g IV every 24 hours and Rocephin 2g IV every 24 hours ?End date: 08/27/21 ? ?IV antibiotic discharge orders are pended. ?To discharging provider:  please sign these orders via discharge navigator,  ?Select New Orders & click on the button choice - Manage This Unsigned Work.  ?  ? ?Thank you for allowing pharmacy to be a part of this patient?s care. ? ?Alycia Rossetti, PharmD, BCPS ?Infectious Diseases Clinical Pharmacist ?07/17/2021 3:41 PM  ? ?**Pharmacist phone directory can now be found on [email or web address] ([email or web address]).  Listed under [place].  ?

## 2021-07-18 ENCOUNTER — Ambulatory Visit: Payer: Medicare Other | Admitting: Physician Assistant

## 2021-07-18 NOTE — Anesthesia Postprocedure Evaluation (Signed)
Anesthesia Post Note ? ?Patient: Tammy Good ? ?Procedure(s) Performed: LEFT FOOT BONE BIOPSY (Left: Foot) ?IRRIGATION AND DEBRIDEMENT FOOT (Left: Foot) ? ?  ? ?Patient location during evaluation: PACU ?Anesthesia Type: MAC ?Level of consciousness: awake and alert ?Pain management: pain level controlled ?Vital Signs Assessment: post-procedure vital signs reviewed and stable ?Respiratory status: spontaneous breathing, nonlabored ventilation, respiratory function stable and patient connected to nasal cannula oxygen ?Cardiovascular status: stable and blood pressure returned to baseline ?Postop Assessment: no apparent nausea or vomiting ?Anesthetic complications: no ? ? ?No notable events documented. ? ?Last Vitals:  ?Vitals:  ? 07/17/21 1052 07/17/21 1549  ?BP: (!) 148/69 132/60  ?Pulse: 82 79  ?Resp: 17 16  ?Temp: 36.9 ?C 37 ?C  ?SpO2: 100% 97%  ?  ?Last Pain:  ?Vitals:  ? 07/17/21 1549  ?TempSrc: Oral  ?PainSc:   ? ? ?  ?  ?  ?  ?  ?  ? ?[place] ? ? ? ? ?

## 2021-07-20 LAB — AEROBIC/ANAEROBIC CULTURE W GRAM STAIN (SURGICAL/DEEP WOUND)
Culture: NO GROWTH
Culture: NO GROWTH
Culture: NO GROWTH
Gram Stain: NONE SEEN
Gram Stain: NONE SEEN
Gram Stain: NONE SEEN

## 2021-07-26 ENCOUNTER — Ambulatory Visit (INDEPENDENT_AMBULATORY_CARE_PROVIDER_SITE_OTHER): Payer: Medicare Other

## 2021-07-26 ENCOUNTER — Ambulatory Visit (INDEPENDENT_AMBULATORY_CARE_PROVIDER_SITE_OTHER): Payer: Medicare Other | Admitting: Podiatry

## 2021-07-26 DIAGNOSIS — M86172 Other acute osteomyelitis, left ankle and foot: Secondary | ICD-10-CM

## 2021-07-26 DIAGNOSIS — Z9889 Other specified postprocedural states: Secondary | ICD-10-CM | POA: Diagnosis not present

## 2021-07-29 NOTE — Progress Notes (Signed)
Subjective: ?Tammy Good is a 71 y.o. is seen today in office s/p left foot wound debridement, bone biopsy.  She was discharged with the hospital and she is currently on ceftriaxone.  She has an appointment scheduled with infectious disease next week.  Patient states that she been feeling well she denied having significant pain.  Dressing has not been changed.  She denies any fevers or chills.  No nausea or vomiting.  No other concerns. ? ? ?Objective: ?General: No acute distress, AAOx3  ?DP/PT pulses palpable, CRT < 3 sec to all digits.  ?Left ankle: Incisions well coapted.  The wound is present the proximal aspect the incision today measures 2 x 1 x 0.1cm and is filling in.  Wound base is dry.  There is no probing, and or tunneling.  There is no surrounding erythema, ascending cellulitis.  No fluctuance or crepitation for this malodor.  The other incisions are well coapted with staples, sutures intact. ?No pain with calf compression, swelling, warmth, erythema.  ? ? ? ? ? ? ? ? ? ? ? ? ? ? ?Assessment and Plan:  ?Status post left ankle incision and drainage, bone biopsy; osteomyelitis ? ?-Treatment options discussed including all alternatives, risks, and complications ?-X-rays were obtained reviewed.  3 views of the foot and ankle were obtained.  There is no definitive evidence of acute osteomyelitis on x-ray left foot.  On the fibula there is likely some chronic changes present which corresponds to the wound. ?-We again had discussion regards to treatment options.  She is not yet ready for below-knee amputation.  We will can continue with close monitoring the wound and conservative care.  She can continue IV antibiotics and recommend follow-up with infectious disease.  I did sharply debride the wound today without any complications.  Dressing was applied and recommended at least 2-3 times a week dressing changes.  She can use Silvadene on the wound and then Betadine along the incisions.  Continue  offloading, elevation.  He is to monitor very closely for any signs or symptoms of worsening infection report directly to emergency room should any occur. ?-We will repeat blood work next appointment to include CBC, sed rate, CRP and last completed by infectious disease at her appointment. ? ? ?Return in about 1 week (around 08/02/2021). ? ?Trula Slade DPM ?

## 2021-07-30 ENCOUNTER — Ambulatory Visit (INDEPENDENT_AMBULATORY_CARE_PROVIDER_SITE_OTHER): Payer: Medicare Other | Admitting: Orthopedic Surgery

## 2021-07-30 DIAGNOSIS — M86172 Other acute osteomyelitis, left ankle and foot: Secondary | ICD-10-CM | POA: Diagnosis not present

## 2021-07-30 DIAGNOSIS — I2510 Atherosclerotic heart disease of native coronary artery without angina pectoris: Secondary | ICD-10-CM

## 2021-08-02 ENCOUNTER — Ambulatory Visit (INDEPENDENT_AMBULATORY_CARE_PROVIDER_SITE_OTHER): Payer: Medicare Other | Admitting: Podiatry

## 2021-08-02 ENCOUNTER — Ambulatory Visit (INDEPENDENT_AMBULATORY_CARE_PROVIDER_SITE_OTHER): Payer: Medicare Other | Admitting: Internal Medicine

## 2021-08-02 ENCOUNTER — Telehealth: Payer: Self-pay

## 2021-08-02 ENCOUNTER — Encounter: Payer: Self-pay | Admitting: Internal Medicine

## 2021-08-02 ENCOUNTER — Other Ambulatory Visit: Payer: Self-pay

## 2021-08-02 VITALS — HR 73 | Temp 98.0°F | Wt 200.0 lb

## 2021-08-02 DIAGNOSIS — M659 Synovitis and tenosynovitis, unspecified: Secondary | ICD-10-CM | POA: Diagnosis not present

## 2021-08-02 DIAGNOSIS — L97322 Non-pressure chronic ulcer of left ankle with fat layer exposed: Secondary | ICD-10-CM

## 2021-08-02 DIAGNOSIS — Z5181 Encounter for therapeutic drug level monitoring: Secondary | ICD-10-CM | POA: Diagnosis not present

## 2021-08-02 DIAGNOSIS — M86172 Other acute osteomyelitis, left ankle and foot: Secondary | ICD-10-CM

## 2021-08-02 DIAGNOSIS — Z452 Encounter for adjustment and management of vascular access device: Secondary | ICD-10-CM | POA: Insufficient documentation

## 2021-08-02 DIAGNOSIS — M009 Pyogenic arthritis, unspecified: Secondary | ICD-10-CM | POA: Insufficient documentation

## 2021-08-02 DIAGNOSIS — M86272 Subacute osteomyelitis, left ankle and foot: Secondary | ICD-10-CM

## 2021-08-02 NOTE — Assessment & Plan Note (Signed)
Also on treatment for this along with the osteomyelitis.  Clinically improving.   ?

## 2021-08-02 NOTE — Telephone Encounter (Signed)
Got it - thanks.

## 2021-08-02 NOTE — Assessment & Plan Note (Signed)
She feels much better and picture reviewed from [name] and looks to be healing well with no drainage or other concerns.  Sutures removed today by [name].  Will continue with the plan for 6 weeks through June 6 then observe off of antibiotics.  I am still concerned with progression of the infection despite appropriate treatment based on the bone and area involved.  I will have her follow up after treatment completion to recheck her foot and check inflammatory markers then.   ?

## 2021-08-02 NOTE — Assessment & Plan Note (Signed)
No issues with the picc line, working well.  Home health will be notified to remove after the last dose June 6th.   ?

## 2021-08-02 NOTE — Assessment & Plan Note (Signed)
Will get recent copies of labs for review.   ?

## 2021-08-02 NOTE — Progress Notes (Signed)
? ?  Subjective:  ? ? Patient ID: Tammy Good, female    DOB: 1950-10-19, 71 y.o.   MRN: [id number] ? 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  Also with calcaneocuboid and talonavicular septic arthritis.  She is tolerating the antibiotics well with no rash or diarrhea.  She is eating better and feels much better overall.  She is pleased with the results to  date.   ? ? ?Review of Systems  ?Constitutional:  Negative for chills and fever.  ?Gastrointestinal:  Negative for diarrhea and nausea.  ?Skin:  Negative for rash.  ? ?   ?Objective:  ? Physical Exam ?Eyes:  ?   General: No scleral icterus. ?Skin: ?   Findings: No rash.  ?Neurological:  ?   General: No focal deficit present.  ?   Mental Status: She is alert.  ?Psychiatric:     ?   Mood and Affect: Mood normal.  ? ?SH: no tobacco ? ? ? ?   ?Assessment & Plan:  ? ? ?

## 2021-08-02 NOTE — Telephone Encounter (Signed)
Per MD reached out to [facility] with orders to pull picc after last dose 6/6.  ? ?Leatrice Jewels, RMA ? ?

## 2021-08-05 ENCOUNTER — Telehealth: Payer: Self-pay | Admitting: *Deleted

## 2021-08-05 NOTE — Progress Notes (Signed)
Subjective: ?Tammy Good is a 71 y.o. is seen today in office s/p left foot wound debridement, bone biopsy.  Still currently on antibiotics and she has a follow-up with infectious disease later today.  She said that she has been doing well.  She denies any increase in swelling or redness or any drainage.  She has no fevers or chills.  She has no other concerns today. ? ?Objective: ?General: No acute distress, AAOx3  ?DP/PT pulses palpable, CRT < 3 sec to all digits.  ?Left ankle: Incisions well coapted.  The wound is present the proximal aspect the incision today measures 1.8 x 0.9 x 0.1cm and is filling in.  Wound base is dry.  I was able to debride the central granular tissue.  Incisions are well coapted without any evidence of dehiscence and sutures, staples intact. ?No pain with calf compression, swelling, warmth, erythema.  ? ?Assessment and Plan:  ?Status post left ankle incision and drainage, bone biopsy; osteomyelitis ? ?-Treatment options discussed including all alternatives, risks, and complications ?-Remove the sutures with the staples intact the incisions.  The incisions appear to be healing well.  I also sharply debrided the wound today utilizing #312 with scalpel down to healthy, bleeding, granular tissue in order to promote wound healing to remove any nonviable devitalized tissue.  Pre and post wound measurements were the same.  Continue with dressing changes at least every other day.  Continue antibiotics per infectious disease.  Follow-up with vascular surgery as scheduled as well. ? ?Return in about 1 week (around 08/09/2021). ? ?Trula Slade DPM ? ?

## 2021-08-05 NOTE — Telephone Encounter (Signed)
Patient wanted to know if the physician was able to order her biotin bandages used at last visit, if not could she come in to pick up some from office?Please advise. ?

## 2021-08-06 ENCOUNTER — Telehealth: Payer: Self-pay | Admitting: Cardiology

## 2021-08-06 MED ORDER — CLOPIDOGREL BISULFATE 75 MG PO TABS: 75.0000 mg | ORAL_TABLET | Freq: Every day | ORAL | 0 refills | Status: DC

## 2021-08-06 MED ORDER — APIXABAN 5 MG PO TABS: 5.0000 mg | ORAL_TABLET | Freq: Two times a day (BID) | ORAL | 6 refills | Status: DC

## 2021-08-06 NOTE — Telephone Encounter (Signed)
Called patient and informed,can she pick up a few from office if available until her supply comes in?

## 2021-08-06 NOTE — Telephone Encounter (Signed)
Notified patient and she said that she will come tomorrow.

## 2021-08-06 NOTE — Telephone Encounter (Signed)
Returned call-wanted to confirm patient is to be on Eliquis and Plavix.  Advised per last OV note and recent discharge summary patient was on both and was advised to continue both.    Patient has upcoming appt 5/31 for follow up.  Request rx sent to last until follow up.  Rx sent.  ? ?Forwarded to pharmD for Eliquis refill ? ?

## 2021-08-06 NOTE — Telephone Encounter (Signed)
[facility] called and wanted to verify which blood thinner the patient is currently taking for blood clots. Please call to give pharmacy right information regarding medication ?

## 2021-08-09 ENCOUNTER — Ambulatory Visit (INDEPENDENT_AMBULATORY_CARE_PROVIDER_SITE_OTHER): Payer: Medicare Other | Admitting: Podiatry

## 2021-08-09 DIAGNOSIS — L97322 Non-pressure chronic ulcer of left ankle with fat layer exposed: Secondary | ICD-10-CM | POA: Diagnosis not present

## 2021-08-09 NOTE — Progress Notes (Signed)
Subjective: Tammy Good is a 71 y.o. is seen today in office s/p left foot wound debridement, bone biopsy.  She presents today for staple removal.  She states she has been doing well and she is tolerating antibiotics without any issues.  She denies any fevers or chills.  She has no new concerns.    Objective: General: No acute distress, AAOx3  DP/PT pulses palpable, CRT < 3 sec to all digits.  Left ankle: Incisions well coapted.  The wound is present the proximal aspect the incision today measures 1.6 x 0.6 x 0.1cm and is filling in.  Wound base is dry.  I was able to debride the wound today.  Pre and post wound measurements were the same.  Granular wound base underneath present.  There is no probing, no tunneling.  There is no surrounding erythema, ascending cellulitis.  No fluctuation or crepitation but there is no malodor. No pain with calf compression, swelling, warmth, erythema.    Assessment and Plan:  Status post left ankle incision and drainage, bone biopsy; osteomyelitis  -Treatment options discussed including all alternatives, risks, and complications -Removed the staples today except for 2 on the medial incision as there is still some gapping present.  Clean them as well and the small amount of Betadine was applied followed by dressing. -Sharply debrided the wound today utilizing with 312 with scalpel any complications down to healthy, granular tissue.  Pre and post wound measurements were the same.  I debrided down to healthy tissue to remove any nonviable devitalized tissue in order to promote wound healing.  Clean the wound with saline.  Hemostasis achieved through manual compression.  Minimal blood loss.  Silvadene was applied followed by dressing. -Continue with dressing changes. -Continue antibiotics. -Monitor for any clinical signs or symptoms of infection and directed to call the office immediately should any occur or go to the ER.  Return in about 10 days (around  08/19/2021).  Trula Slade DPM

## 2021-08-12 ENCOUNTER — Other Ambulatory Visit: Payer: Self-pay | Admitting: Podiatry

## 2021-08-12 DIAGNOSIS — Z9889 Other specified postprocedural states: Secondary | ICD-10-CM

## 2021-08-13 LAB — FUNGUS CULTURE WITH STAIN

## 2021-08-13 LAB — FUNGAL ORGANISM REFLEX

## 2021-08-13 LAB — FUNGUS CULTURE RESULT

## 2021-08-15 ENCOUNTER — Other Ambulatory Visit: Payer: Self-pay | Admitting: *Deleted

## 2021-08-15 ENCOUNTER — Other Ambulatory Visit: Payer: Self-pay

## 2021-08-15 DIAGNOSIS — I739 Peripheral vascular disease, unspecified: Secondary | ICD-10-CM

## 2021-08-15 DIAGNOSIS — I4891 Unspecified atrial fibrillation: Secondary | ICD-10-CM | POA: Insufficient documentation

## 2021-08-15 DIAGNOSIS — I480 Paroxysmal atrial fibrillation: Secondary | ICD-10-CM | POA: Insufficient documentation

## 2021-08-15 NOTE — Progress Notes (Signed)
Orders entered by Dr Carlis Abbott

## 2021-08-20 ENCOUNTER — Ambulatory Visit (INDEPENDENT_AMBULATORY_CARE_PROVIDER_SITE_OTHER): Payer: Medicare Other | Admitting: Podiatry

## 2021-08-20 DIAGNOSIS — M79675 Pain in left toe(s): Secondary | ICD-10-CM

## 2021-08-20 DIAGNOSIS — M79674 Pain in right toe(s): Secondary | ICD-10-CM | POA: Diagnosis not present

## 2021-08-20 DIAGNOSIS — B351 Tinea unguium: Secondary | ICD-10-CM

## 2021-08-20 DIAGNOSIS — E119 Type 2 diabetes mellitus without complications: Secondary | ICD-10-CM | POA: Diagnosis not present

## 2021-08-20 NOTE — Progress Notes (Unsigned)
Subjective: Tammy Good is a 71 y.o. is seen today in office s/p left foot wound debridement, bone biopsy.  She is on ceftriaxone and vancomycin   She is asking for the nails to be trimmed.   Objective: General: No acute distress, AAOx3  DP/PT pulses palpable, CRT < 3 sec to all digits.  Left ankle:  Nails appear to be hypertrophic, dystrophic with yellow, brown discoloration.  Tenderness nails 1 through 4 on the left and 1 through 5 on the right.  There is dried blood around the right third digit nail where she states that she stubbed her toe.  The nail was removed here.  No drainage or pus. No pain with calf compression, swelling, warmth, erythema.    Assessment and Plan:  Status post left ankle incision and drainage, bone biopsy; osteomyelitis  -Treatment options discussed including all alternatives, risks, and complications -Removed the staples today except for 2 on the medial incision as there is still some gapping present.  Clean them as well and the small amount of Betadine was applied followed by dressing. -Sharply debrided the wound today utilizing with 312 with scalpel any complications down to healthy, granular tissue.  Pre and post wound measurements were the same.  I debrided down to healthy tissue to remove any nonviable devitalized tissue in order to promote wound healing.  Clean the wound with saline.  Hemostasis achieved through manual compression.  Minimal blood loss.  Silvadene was applied followed by dressing. -Continue with dressing changes. -Continue antibiotics. -Monitor for any clinical signs or symptoms of infection and directed to call the office immediately should any occur or go to the ER.  Return in about 10 days (around 08/19/2021).  Trula Slade DPM

## 2021-08-20 NOTE — Progress Notes (Unsigned)
Cardiology Office Note:    Date:  08/21/2021   ID:  Tammy Good, DOB February 10, 1951, MRN [id number]  PCP:  [name], [place] Cardiologist: Glenetta Hew, MD   Reason for visit: Hospital follow-up  History of Present Illness:    Tammy Good is a 71 y.o. female with a hx of CAD with PCI to the RCA in 2015 and PCI to LCx in 2022, PAD with critical limb ischemia and right SFA stenting in August 2022 and atrial fibrillation.  She was last seen by Bunnie Domino in November 2022.  Her right lower leg was healing well after SFA stenting.  Patient was admitted April 16 to July 17, 2021.  She was admitted with nausea and vomiting.  MRI showed new osteomyelitis of the midfoot and left ankle.  She underwent left foot wound debridement and bone biopsy.  Treated with antibiotics.  She underwent Left SFA angioplasty with stent placement, Left anterior tibial artery angioplasty, Left TP trunk angioplasty, Left posterior tibial artery angioplasty.  Today, she is doing well.  She states her left foot wounds are healing well.  She has been following up with the podiatrist every 10 days.  She states she is on antibiotics for another week.  She denies claudication (denies history of claudication actually).  She sees vascular next week.  She is on Eliquis and Plavix with no bloody stools or nosebleeds.  She was told to stop fish oil.  Otherwise, she denies chest pain, shortness of breath, PND, orthopnea, lightheadedness and palpitations.  She states she never had chest pain with her cardiac events in the past.  She has not needed to use Lasix recently.  She lives alone.  She ambulates with a cane and walker.  She has family that visits frequently.  OT comes out to her house once a week and PT twice a week.    Past Medical History:  Diagnosis Date   CAD S/P percutaneous coronary angioplasty 08/2013   100% mRCA - PCI Integrity Resolute DES 3.0 mm x 38 mm - 3.35 mm; PTCA of RPA  V 2.0 mm x 15 mm   Cholesteatoma    right   Diabetes mellitus type 2 in obese (HCC)    On insulin and Invokana   History of osteomyelitis L 5th Toe all 05/2019   s/p Partial Ray Amputation with partial closure; 6 wks Abx & LSFA Atherectomy/DEB PTA with Stent for focal dissection.   Hyperlipidemia with target LDL less than 70    Hypothyroidism (acquired)    Mild essential hypertension    Obesity (BMI 30-39.9) 11/17/2013   PAD (peripheral artery disease) ([place]) 05/26/2019   05/26/19: Abd AoGram- BLE runoff -> L SFA orbital atherectomy - PTA w/ DEB & Stent 6 x 40 Luttonix (for focal dissection) - patent Pop A with 3 V runoff. LEA Dopplers 01/03/2020: RABI (prev) 0.68 (0.53)/ RTBI (prev) 0.58 (0.33); LABI (prev) 0.80 (0.64), LTBI (prev) 0.64 (0.51); R mSFA ~50-74%, L mSFA 50-74%. Patent Prox SFA stent < 49% stenosis   Panhypopituitarism (HCC)    ST elevation myocardial infarction (STEMI) of inferior wall, subsequent episode of care ([place]) 08/2013   80% branch of D1, 40% mid AV groove circumflex, 100% RCA with subacute thrombus -- thrombus extending into RPA V with 100% occlusion after initial angioplasty of mid RCA ;; Post MI ECHO 6/9/'15: EF 50-55%, mild LVH with moderate HK of inferior wall, Gr1 DD, mild LA dilation; mildly reduced RV function  Past Surgical History:  Procedure Laterality Date   ABDOMINAL AORTOGRAM W/LOWER EXTREMITY N/A 05/26/2019   Procedure: ABDOMINAL AORTOGRAM W/LOWER EXTREMITY;  Surgeon: Marty Heck, MD;  Location: [place] [facility];  Service: Cardiovascular;  Laterality: N/A;   ABDOMINAL AORTOGRAM W/LOWER EXTREMITY N/A 11/15/2020   Procedure: ABDOMINAL AORTOGRAM W/LOWER EXTREMITY;  Surgeon: Marty Heck, MD;  Location: [place] [facility];  Service: Cardiovascular;  Laterality: N/A;   ABDOMINAL AORTOGRAM W/LOWER EXTREMITY N/A 04/18/2021   Procedure: ABDOMINAL AORTOGRAM W/LOWER EXTREMITY;  Surgeon: Marty Heck, MD;  Location: [place] [facility];  Service:  Cardiovascular;  Laterality: N/A;   ABDOMINAL AORTOGRAM W/LOWER EXTREMITY Left 07/11/2021   Procedure: ABDOMINAL AORTOGRAM W/LOWER EXTREMITY;  Surgeon: Marty Heck, MD;  Location: [place] [facility];  Service: Cardiovascular;  Laterality: Left;   AMPUTATION Left 05/25/2019   Procedure: AMPUTATION RAY 5th;  Surgeon: Trula Slade, DPM;  Location: [place];  Service: Podiatry;  Laterality: Left;   BONE BIOPSY Left 04/24/2021   Procedure: BONE BIOPSY;  Surgeon: Landis Martins, DPM;  Location: [place];  Service: Podiatry;  Laterality: Left;  left ankle/fibula   BONE BIOPSY Left 07/15/2021   Procedure: LEFT FOOT BONE BIOPSY;  Surgeon: Trula Slade, DPM;  Location: [place];  Service: Podiatry;  Laterality: Left;   Cardiac Event Monitor  July-August 2015   Sinus rhythm with PVCs   COLONOSCOPY N/A 08/31/2013   Procedure: COLONOSCOPY;  Surgeon: Juanita Craver, MD;  Location: [facility];  Service: Endoscopy;  Laterality: N/A;   CORONARY STENT INTERVENTION N/A 11/19/2020   Procedure: CORONARY STENT INTERVENTION;  Surgeon: Nelva Bush, MD;  Location: [place] [facility];  Service: Cardiovascular;  Laterality: N/A;   ESOPHAGOGASTRODUODENOSCOPY N/A 09/01/2013   Procedure: ESOPHAGOGASTRODUODENOSCOPY (EGD);  Surgeon: Beryle Beams, MD;  Location: [facility] ENDOSCOPY;  Service: Endoscopy;  Laterality: N/A;  bedside   ESOPHAGOGASTRODUODENOSCOPY (EGD) WITH PROPOFOL N/A 05/26/2021   Procedure: ESOPHAGOGASTRODUODENOSCOPY (EGD) WITH PROPOFOL;  Surgeon: Carol Ada, MD;  Location: [place];  Service: Gastroenterology;  Laterality: N/A;   INCISION AND DRAINAGE Left 04/24/2021   Procedure: INCISION AND DRAINAGE;  Surgeon: Landis Martins, DPM;  Location: [place];  Service: Podiatry;  Laterality: Left;   IRRIGATION AND DEBRIDEMENT FOOT Left 07/15/2021   Procedure: IRRIGATION AND DEBRIDEMENT FOOT;  Surgeon: Trula Slade, DPM;  Location: [place];  Service: Podiatry;  Laterality: Left;   LEFT HEART CATH AND  CORONARY ANGIOGRAPHY N/A 11/19/2020   Procedure: LEFT HEART CATH AND CORONARY ANGIOGRAPHY;  Surgeon: Nelva Bush, MD;  Location: [place] [facility];  Service: Cardiovascular;  Laterality: N/A;   LEFT HEART CATHETERIZATION WITH CORONARY ANGIOGRAM N/A 08/30/2013   Procedure: LEFT HEART CATHETERIZATION WITH CORONARY ANGIOGRAM;  Surgeon: Leonie Man, MD;  Location: [facility] CATH LAB: 100% mRCA (thrombus - extends to RPAV), 80% D1, 40% AVG Cx.   PERCUTANEOUS CORONARY STENT INTERVENTION (PCI-S)  08/30/2013   Procedure: PERCUTANEOUS CORONARY STENT INTERVENTION (PCI-S);  Surgeon: Leonie Man, MD;  Location: [facility] CATH LAB;  Integrity Resolute DES 2.0 mm x 38 mm -- 3.35 mm.; PTCA of proximal RPA V. - 3.0 mm x 15 mm balloon   PERIPHERAL VASCULAR ATHERECTOMY  05/26/2019   Procedure: PERIPHERAL VASCULAR ATHERECTOMY;  Surgeon: Marty Heck, MD;  Location: [place] [facility];  Service: Cardiovascular;;  Left SFA   PERIPHERAL VASCULAR BALLOON ANGIOPLASTY Left 07/11/2021   Procedure: PERIPHERAL VASCULAR BALLOON ANGIOPLASTY;  Surgeon: Marty Heck, MD;  Location: [place] [facility];  Service: Cardiovascular;  Laterality: Left;  PT TRUNK / AT   PERIPHERAL VASCULAR INTERVENTION  05/26/2019   Procedure: PERIPHERAL VASCULAR INTERVENTION;  Surgeon: Marty Heck, MD;  Location: [place] [facility];  Service: Cardiovascular;;  Left SFA   PERIPHERAL VASCULAR INTERVENTION Right 11/15/2020   Procedure: PERIPHERAL VASCULAR INTERVENTION;  Surgeon: Marty Heck, MD;  Location: [place] [facility];  Service: Cardiovascular;  Laterality: Right;  Superficial Femoral Artery   PERIPHERAL VASCULAR INTERVENTION Left 07/11/2021   Procedure: PERIPHERAL VASCULAR INTERVENTION;  Surgeon: Marty Heck, MD;  Location: [place] [facility];  Service: Cardiovascular;  Laterality: Left;  SFA   PITUITARY SURGERY     TEE WITHOUT CARDIOVERSION N/A 04/26/2021   Procedure: TRANSESOPHAGEAL ECHOCARDIOGRAM (TEE);   Surgeon: Buford Dresser, MD;  Location: [facility] ENDOSCOPY;  Service: Cardiovascular;  Laterality: N/A;   TRANSTHORACIC ECHOCARDIOGRAM  08/30/2013   mild LVH. EF 50-55%. Moderate HK of the entire inferior myocardium. GR 1 DD. Mild LA dilation. Mildly reduced RV function   TYMPANOMASTOIDECTOMY Right 12/28/2017   Procedure: RIGHT TYMPANOMASTOIDECTOMY;  Surgeon: Leta Baptist, MD;  Location: [place];  Service: ENT;  Laterality: Right;    Current Medications: Current Meds  Medication Sig   acetaminophen (TYLENOL) 500 MG tablet Take 500-1,000 mg by mouth 3 (three) times daily as needed for headache (pain).   apixaban (ELIQUIS) 5 MG TABS tablet Take 1 tablet (5 mg total) by mouth 2 (two) times daily.   atorvastatin (LIPITOR) 40 MG tablet Take 1 tablet (40 mg total) by mouth daily at 6 PM. (Patient taking differently: Take 40 mg by mouth every evening.)   B-D UF III MINI PEN NEEDLES 31G X 5 MM MISC Inject into the skin.   cefTRIAXone (ROCEPHIN) IVPB Inject 2 g into the vein daily. Indication:  L-ankle osteo First Dose: Yes Last Day of Therapy:  08/27/21 Labs - Once weekly:  CBC/D and BMP, Labs - Every other week:  ESR and CRP Method of administration: IV Push Method of administration may be changed at the discretion of home infusion pharmacist based upon assessment of the patient and/or caregiver's ability to self-administer the medication ordered.   clopidogrel (PLAVIX) 75 MG tablet Take 1 tablet (75 mg total) by mouth daily.   collagenase (SANTYL) 250 UNIT/GM ointment Apply 1 application. topically daily. (Patient taking differently: Apply 1 application. topically daily. Left foot)   Continuous Blood Gluc Sensor (FREESTYLE LIBRE 2 SENSOR) MISC Apply topically every 14 (fourteen) days.   dapagliflozin propanediol (FARXIGA) 10 MG TABS tablet Take 10 mg by mouth every morning.   dexamethasone (DECADRON) 0.5 MG tablet Take 2 tablets (1 mg total) by mouth daily.   ferrous sulfate 325 (65  FE) MG tablet Take 325 mg by mouth every evening.   furosemide (LASIX) 20 MG tablet Take 1 tablet (20 mg total) by mouth daily as needed for fluid or edema.   insulin aspart (NOVOLOG FLEXPEN) 100 UNIT/ML FlexPen Inject 3-8 Units into the skin See admin instructions. Inject 3-8 units subcutaneously up to three times daily per sliding scale - based on carb count   levothyroxine (SYNTHROID) 125 MCG tablet Take 1 tablet (125 mcg total) by mouth daily before breakfast.   midodrine (PROAMATINE) 2.5 MG tablet Take 1 tablet (2.5 mg total) by mouth 2 (two) times daily with a meal.   nystatin cream (MYCOSTATIN) Apply 1 application topically 2 (two) times daily. (Patient taking differently: Apply 1 application. topically 2 (two) times daily as needed (rash).)  pantoprazole (PROTONIX) 40 MG tablet Take 1 tablet (40 mg total) by mouth 2 (two) times daily before a meal.   polyethylene glycol powder (GLYCOLAX/MIRALAX) 17 GM/SCOOP powder Take 1 capful with water (17 g) by mouth daily.   Propylene Glycol (SYSTANE COMPLETE) 0.6 % SOLN Place 1 drop into both eyes daily as needed (dry eyes).   vancomycin IVPB Inject 1,000 mg into the vein daily. Indication:  L-ankle oste First Dose: Yes Last Day of Therapy:  08/27/21 Labs - Sunday/Monday:  CBC/D, BMP, and vancomycin trough. Labs - Thursday:  BMP and vancomycin trough Labs - Every other week:  ESR and CRP Method of administration:Elastomeric Method of administration may be changed at the discretion of the patient and/or caregiver's ability to self-administer the medication ordered.     Allergies:   Strawberry extract   Social History   Socioeconomic History   Marital status: Widowed    Spouse name: Not on file   Number of children: Not on file   Years of education: Not on file   Highest education level: Not on file  Occupational History   Not on file  Tobacco Use   Smoking status: Former   Smokeless tobacco: Never  Substance and Sexual Activity    Alcohol use: No   Drug use: No   Sexual activity: Not Currently    Birth control/protection: Post-menopausal  Other Topics Concern   Not on file  Social History Narrative   Widow. Works at [employer].   Former smoker.   Overall not very active.  Hoping to get into water aerobics class.   Social Determinants of Health   Financial Resource Strain: Not on file  Food Insecurity: Not on file  Transportation Needs: Not on file  Physical Activity: Not on file  Stress: Not on file  Social Connections: Not on file     Family History: The patient's family history includes Alzheimer's disease in her maternal grandmother; Cancer in her sister; Cancer (age of onset: 32) in her mother; Heart attack (age of onset: 68) in her father.  ROS:   Please see the history of present illness.     EKGs/Labs/Other Studies Reviewed:    Recent Labs: 11/16/2020: TSH <0.010 07/07/2021: ALT 11; B Natriuretic Peptide 74.0 07/15/2021: Magnesium 1.8 07/17/2021: BUN <5; Creatinine, Ser 0.76; Hemoglobin 8.9; Platelets 472; Potassium 3.8; Sodium 138   Recent Lipid Panel Lab Results  Component Value Date/Time   CHOL 80 07/12/2021 01:18 AM   TRIG 106 07/12/2021 01:18 AM   HDL 22 (L) 07/12/2021 01:18 AM   LDLCALC 37 07/12/2021 01:18 AM    Physical Exam:    VS:  BP 130/82   Pulse 70   Ht 5' 7" (1.702 m)   Wt 207 lb 3.2 oz (94 kg)   SpO2 100%   BMI 32.45 kg/m    No data found.  Wt Readings from Last 3 Encounters:  08/21/21 207 lb 3.2 oz (94 kg)  08/02/21 200 lb (90.7 kg)  07/15/21 191 lb 9.3 oz (86.9 kg)     GEN:  Well nourished, well developed in no acute distress; with cane HEENT: Normal NECK: No JVD; No carotid bruits CARDIAC: RRR with ectopics RESPIRATORY:  Clear to auscultation without rales, wheezing or rhonchi  ABDOMEN: Soft, non-tender, non-distended MUSCULOSKELETAL: pedal edema L>R; bandages on left foot  SKIN: Warm and dry NEUROLOGIC:  Alert and oriented PSYCHIATRIC:  Normal  affect     ASSESSMENT AND PLAN   Coronary artery  disease with no angina -Status post PCI to RCA 2015 and left circumflex 2022; multifocal D1 disease up to 90% though too small for PCI in 2022 -Would favor long-term Plavix if no bleeding issues given significant history of CAD and PAD -Continue Lipitor -Risk factors hyperlipidemia and diabetes (A1C <7%) well controlled.  PAD -Status post right SFA stenting for critical limb ischemia in 2022 -Status post left SFA stenting and left tibial angioplasty in April 2023 -On Eliquis and Plavix -Followed by vascular; post procedure ABIs scheduled  Paroxysmal atrial fibrillation -Denies palpitations -Continue Eliquis for stroke prevention.  Hyperlipidemia with goal DL less than 55 -LDL 37 April 2023.  Continue Lipitor. -Discussed cholesterol lowering diets - Mediterranean diet, DASH diet, vegetarian diet, low-carbohydrate diet and avoidance of trans fats.  Discussed healthier choice substitutes.  Nuts, high-fiber foods, and fiber supplements may also improve lipids.    Disposition - Follow-up in 6 months Dr. Ellyn Hack.   Medication Adjustments/Labs and Tests Ordered: Current medicines are reviewed at length with the patient today.  Concerns regarding medicines are outlined above.  No orders of the defined types were placed in this encounter.  No orders of the defined types were placed in this encounter.   Patient Instructions  Medication Instructions:  Stop Fish Oil. *If you need a refill on your cardiac medications before your next appointment, please call your pharmacy*   Lab Work: No Labs If you have labs (blood work) drawn today and your tests are completely normal, you will receive your results only by: [place] (if you have MyChart) OR A paper copy in the mail If you have any lab test that is abnormal or we need to change your treatment, we will call you to review the results.   Testing/Procedures: No  Testing   Follow-Up: At [facility], you and your health needs are our priority.  As part of our continuing mission to provide you with exceptional heart care, we have created designated Provider Care Teams.  These Care Teams include your primary Cardiologist (physician) and Advanced Practice Providers (APPs -  Physician Assistants and Nurse Practitioners) who all work together to provide you with the care you need, when you need it.  We recommend signing up for the patient portal called "MyChart".  Sign up information is provided on this After Visit Summary.  MyChart is used to connect with patients for Virtual Visits (Telemedicine).  Patients are able to view lab/test results, encounter notes, upcoming appointments, etc.  Non-urgent messages can be sent to your provider as well.   To learn more about what you can do with MyChart, go to [email or web address].    Your next appointment:   6 month(s)  The format for your next appointment:   In Person  Provider:   Glenetta Hew, MD       Important Information About Sugar         Signed, Warren Lacy, PA-C  08/21/2021 8:42 AM    [place]

## 2021-08-21 ENCOUNTER — Encounter: Payer: Self-pay | Admitting: Physician Assistant

## 2021-08-21 ENCOUNTER — Ambulatory Visit (INDEPENDENT_AMBULATORY_CARE_PROVIDER_SITE_OTHER): Payer: Medicare Other | Admitting: Physician Assistant

## 2021-08-21 VITALS — BP 130/82 | HR 70 | Ht 67.0 in | Wt 207.2 lb

## 2021-08-21 DIAGNOSIS — E7800 Pure hypercholesterolemia, unspecified: Secondary | ICD-10-CM

## 2021-08-21 DIAGNOSIS — I10 Essential (primary) hypertension: Secondary | ICD-10-CM

## 2021-08-21 DIAGNOSIS — I739 Peripheral vascular disease, unspecified: Secondary | ICD-10-CM

## 2021-08-21 DIAGNOSIS — I480 Paroxysmal atrial fibrillation: Secondary | ICD-10-CM

## 2021-08-21 DIAGNOSIS — I2510 Atherosclerotic heart disease of native coronary artery without angina pectoris: Secondary | ICD-10-CM | POA: Diagnosis not present

## 2021-08-21 NOTE — Progress Notes (Signed)
Office Note     CC:  follow up Requesting Provider:  [name], MD  HPI: Tammy Good is a 71 y.o. (1950/09/01) female who presents for follow up of PAD. She is s/p Left lower extremity arteriogram with Left SFA angioplasty with stent placement, Left anterior tibial artery angioplasty, Left TP trunk angioplasty and Left posterior tibial artery angioplasty by [name] on 07/11/21. This was performed secondary to drop in her non invasive studies as well as new left ankle wound. Since her intervention she reports no pain. She is ambulating without pain, no pain at rest and no new wounds. She is following up with [name] on Friday for her left ankle. She will have her last does of IV vancomycin today via RUE PICC line. She says otherwise she is doing very well.  The pt is on a statin for cholesterol management.  The pt is not on a daily aspirin.   Other AC:  Eliquis/Plavix The pt is not on medication for hypertension.   The pt  diabetic.   Tobacco hx:  Former  Past Medical History:  Diagnosis Date   CAD S/P percutaneous coronary angioplasty 08/2013   100% mRCA - PCI Integrity Resolute DES 3.0 mm x 38 mm - 3.35 mm; PTCA of RPA V 2.0 mm x 15 mm   Cholesteatoma    right   Diabetes mellitus type 2 in obese (HCC)    On insulin and Invokana   History of osteomyelitis L 5th Toe all 05/2019   s/p Partial Ray Amputation with partial closure; 6 wks Abx & LSFA Atherectomy/DEB PTA with Stent for focal dissection.   Hyperlipidemia with target LDL less than 70    Hypothyroidism (acquired)    Mild essential hypertension    Obesity (BMI 30-39.9) 11/17/2013   PAD (peripheral artery disease) ([place]) 05/26/2019   05/26/19: Abd AoGram- BLE runoff -> L SFA orbital atherectomy - PTA w/ DEB & Stent 6 x 40 Luttonix (for focal dissection) - patent Pop A with 3 V runoff. LEA Dopplers 01/03/2020: RABI (prev) 0.68 (0.53)/ RTBI (prev) 0.58 (0.33); LABI (prev) 0.80 (0.64), LTBI (prev) 0.64 (0.51); R mSFA ~50-74%,  L mSFA 50-74%. Patent Prox SFA stent < 49% stenosis   Panhypopituitarism (HCC)    ST elevation myocardial infarction (STEMI) of inferior wall, subsequent episode of care ([place]) 08/2013   80% branch of D1, 40% mid AV groove circumflex, 100% RCA with subacute thrombus -- thrombus extending into RPA V with 100% occlusion after initial angioplasty of mid RCA ;; Post MI ECHO 6/9/'15: EF 50-55%, mild LVH with moderate HK of inferior wall, Gr1 DD, mild LA dilation; mildly reduced RV function    Past Surgical History:  Procedure Laterality Date   ABDOMINAL AORTOGRAM W/LOWER EXTREMITY N/A 05/26/2019   Procedure: ABDOMINAL AORTOGRAM W/LOWER EXTREMITY;  Surgeon: Marty Heck, MD;  Location: [place] [facility];  Service: Cardiovascular;  Laterality: N/A;   ABDOMINAL AORTOGRAM W/LOWER EXTREMITY N/A 11/15/2020   Procedure: ABDOMINAL AORTOGRAM W/LOWER EXTREMITY;  Surgeon: Marty Heck, MD;  Location: [facility] [facility];  Service: Cardiovascular;  Laterality: N/A;   ABDOMINAL AORTOGRAM W/LOWER EXTREMITY N/A 04/18/2021   Procedure: ABDOMINAL AORTOGRAM W/LOWER EXTREMITY;  Surgeon: Marty Heck, MD;  Location: [place] [facility];  Service: Cardiovascular;  Laterality: N/A;   ABDOMINAL AORTOGRAM W/LOWER EXTREMITY Left 07/11/2021   Procedure: ABDOMINAL AORTOGRAM W/LOWER EXTREMITY;  Surgeon: Marty Heck, MD;  Location: [place] [place] [facility];  Service: Cardiovascular;  Laterality: Left;  AMPUTATION Left 05/25/2019   Procedure: AMPUTATION RAY 5th;  Surgeon: Trula Slade, DPM;  Location: [facility];  Service: Podiatry;  Laterality: Left;   BONE BIOPSY Left 04/24/2021   Procedure: BONE BIOPSY;  Surgeon: Landis Martins, DPM;  Location: [place];  Service: Podiatry;  Laterality: Left;  left ankle/fibula   BONE BIOPSY Left 07/15/2021   Procedure: LEFT FOOT BONE BIOPSY;  Surgeon: Trula Slade, DPM;  Location: [place];  Service: Podiatry;  Laterality: Left;   Cardiac Event Monitor  July-August 2015    Sinus rhythm with PVCs   COLONOSCOPY N/A 08/31/2013   Procedure: COLONOSCOPY;  Surgeon: Juanita Craver, MD;  Location: [facility] ENDOSCOPY;  Service: Endoscopy;  Laterality: N/A;   CORONARY STENT INTERVENTION N/A 11/19/2020   Procedure: CORONARY STENT INTERVENTION;  Surgeon: Nelva Bush, MD;  Location: [place] [facility];  Service: Cardiovascular;  Laterality: N/A;   ESOPHAGOGASTRODUODENOSCOPY N/A 09/01/2013   Procedure: ESOPHAGOGASTRODUODENOSCOPY (EGD);  Surgeon: Beryle Beams, MD;  Location: [facility] ENDOSCOPY;  Service: Endoscopy;  Laterality: N/A;  bedside   ESOPHAGOGASTRODUODENOSCOPY (EGD) WITH PROPOFOL N/A 05/26/2021   Procedure: ESOPHAGOGASTRODUODENOSCOPY (EGD) WITH PROPOFOL;  Surgeon: Carol Ada, MD;  Location: [place];  Service: Gastroenterology;  Laterality: N/A;   INCISION AND DRAINAGE Left 04/24/2021   Procedure: INCISION AND DRAINAGE;  Surgeon: Landis Martins, DPM;  Location: [place];  Service: Podiatry;  Laterality: Left;   IRRIGATION AND DEBRIDEMENT FOOT Left 07/15/2021   Procedure: IRRIGATION AND DEBRIDEMENT FOOT;  Surgeon: Trula Slade, DPM;  Location: [place];  Service: Podiatry;  Laterality: Left;   LEFT HEART CATH AND CORONARY ANGIOGRAPHY N/A 11/19/2020   Procedure: LEFT HEART CATH AND CORONARY ANGIOGRAPHY;  Surgeon: Nelva Bush, MD;  Location: [place] [facility];  Service: Cardiovascular;  Laterality: N/A;   LEFT HEART CATHETERIZATION WITH CORONARY ANGIOGRAM N/A 08/30/2013   Procedure: LEFT HEART CATHETERIZATION WITH CORONARY ANGIOGRAM;  Surgeon: Leonie Man, MD;  Location: [facility] CATH LAB: 100% mRCA (thrombus - extends to RPAV), 80% D1, 40% AVG Cx.   PERCUTANEOUS CORONARY STENT INTERVENTION (PCI-S)  08/30/2013   Procedure: PERCUTANEOUS CORONARY STENT INTERVENTION (PCI-S);  Surgeon: Leonie Man, MD;  Location: [facility] CATH LAB;  Integrity Resolute DES 2.0 mm x 38 mm -- 3.35 mm.; PTCA of proximal RPA V. - 3.0 mm x 15 mm balloon   PERIPHERAL VASCULAR ATHERECTOMY  05/26/2019    Procedure: PERIPHERAL VASCULAR ATHERECTOMY;  Surgeon: Marty Heck, MD;  Location: [place] [facility];  Service: Cardiovascular;;  Left SFA   PERIPHERAL VASCULAR BALLOON ANGIOPLASTY Left 07/11/2021   Procedure: PERIPHERAL VASCULAR BALLOON ANGIOPLASTY;  Surgeon: Marty Heck, MD;  Location: [place] [facility];  Service: Cardiovascular;  Laterality: Left;  PT TRUNK / AT   PERIPHERAL VASCULAR INTERVENTION  05/26/2019   Procedure: PERIPHERAL VASCULAR INTERVENTION;  Surgeon: Marty Heck, MD;  Location: [place] [facility];  Service: Cardiovascular;;  Left SFA   PERIPHERAL VASCULAR INTERVENTION Right 11/15/2020   Procedure: PERIPHERAL VASCULAR INTERVENTION;  Surgeon: Marty Heck, MD;  Location: [place] [facility];  Service: Cardiovascular;  Laterality: Right;  Superficial Femoral Artery   PERIPHERAL VASCULAR INTERVENTION Left 07/11/2021   Procedure: PERIPHERAL VASCULAR INTERVENTION;  Surgeon: Marty Heck, MD;  Location: [place] [facility];  Service: Cardiovascular;  Laterality: Left;  SFA   PITUITARY SURGERY     TEE WITHOUT CARDIOVERSION N/A 04/26/2021   Procedure: TRANSESOPHAGEAL ECHOCARDIOGRAM (TEE);  Surgeon: Buford Dresser, MD;  Location: [place];  Service: Cardiovascular;  Laterality: N/A;   TRANSTHORACIC ECHOCARDIOGRAM  08/30/2013   mild LVH. EF 50-55%. Moderate HK of the entire inferior myocardium. GR 1 DD. Mild LA dilation. Mildly reduced RV function   TYMPANOMASTOIDECTOMY Right 12/28/2017   Procedure: RIGHT TYMPANOMASTOIDECTOMY;  Surgeon: Leta Baptist, MD;  Location: [place];  Service: ENT;  Laterality: Right;    Social History   Socioeconomic History   Marital status: Widowed    Spouse name: Not on file   Number of children: Not on file   Years of education: Not on file   Highest education level: Not on file  Occupational History   Not on file  Tobacco Use   Smoking status: Former   Smokeless tobacco: Never  Substance  and Sexual Activity   Alcohol use: No   Drug use: No   Sexual activity: Not Currently    Birth control/protection: Post-menopausal  Other Topics Concern   Not on file  Social History Narrative   Widow. Works at [employer].   Former smoker.   Overall not very active.  Hoping to get into water aerobics class.   Social Determinants of Health   Financial Resource Strain: Not on file  Food Insecurity: Not on file  Transportation Needs: Not on file  Physical Activity: Not on file  Stress: Not on file  Social Connections: Not on file  Intimate Partner Violence: Not on file    Family History  Problem Relation Age of Onset   Cancer Mother 74       multiple myeloma   Heart attack Father 52   Cancer Sister    Alzheimer's disease Maternal Grandmother     Current Outpatient Medications  Medication Sig Dispense Refill   acetaminophen (TYLENOL) 500 MG tablet Take 500-1,000 mg by mouth 3 (three) times daily as needed for headache (pain).     apixaban (ELIQUIS) 5 MG TABS tablet Take 1 tablet (5 mg total) by mouth 2 (two) times daily. 60 tablet 6   atorvastatin (LIPITOR) 40 MG tablet Take 1 tablet (40 mg total) by mouth daily at 6 PM. (Patient taking differently: Take 40 mg by mouth every evening.) 30 tablet 0   B-D UF III MINI PEN NEEDLES 31G X 5 MM MISC Inject into the skin.     cefTRIAXone (ROCEPHIN) IVPB Inject 2 g into the vein daily. Indication:  L-ankle osteo First Dose: Yes Last Day of Therapy:  08/27/21 Labs - Once weekly:  CBC/D and BMP, Labs - Every other week:  ESR and CRP Method of administration: IV Push Method of administration may be changed at the discretion of home infusion pharmacist based upon assessment of the patient and/or caregiver's ability to self-administer the medication ordered. 41 Units 0   clopidogrel (PLAVIX) 75 MG tablet Take 1 tablet (75 mg total) by mouth daily. 30 tablet 0   collagenase (SANTYL) 250 UNIT/GM ointment Apply 1 application. topically  daily. (Patient taking differently: Apply 1 application. topically daily. Left foot) 15 g 0   Continuous Blood Gluc Sensor (FREESTYLE LIBRE 2 SENSOR) MISC Apply topically every 14 (fourteen) days.     dapagliflozin propanediol (FARXIGA) 10 MG TABS tablet Take 10 mg by mouth every morning.     dexamethasone (DECADRON) 0.5 MG tablet Take 2 tablets (1 mg total) by mouth daily. 28 tablet 0   ferrous sulfate 325 (65 FE) MG tablet Take 325 mg by mouth every evening.     furosemide (LASIX) 20 MG tablet Take 1 tablet (  20 mg total) by mouth daily as needed for fluid or edema. 30 tablet 0   insulin aspart (NOVOLOG FLEXPEN) 100 UNIT/ML FlexPen Inject 3-8 Units into the skin See admin instructions. Inject 3-8 units subcutaneously up to three times daily per sliding scale - based on carb count     levothyroxine (SYNTHROID) 125 MCG tablet Take 1 tablet (125 mcg total) by mouth daily before breakfast. 30 tablet 2   metoCLOPramide (REGLAN) 5 MG tablet Take 1 tablet (5 mg total) by mouth 3 (three) times daily as needed for nausea or vomiting. Take before meals as needed, if having nausea 90 tablet 1   midodrine (PROAMATINE) 2.5 MG tablet Take 1 tablet (2.5 mg total) by mouth 2 (two) times daily with a meal. 60 tablet 0   nystatin cream (MYCOSTATIN) Apply 1 application topically 2 (two) times daily. (Patient taking differently: Apply 1 application. topically 2 (two) times daily as needed (rash).) 30 g 1   pantoprazole (PROTONIX) 40 MG tablet Take 1 tablet (40 mg total) by mouth 2 (two) times daily before a meal. 60 tablet 0   polyethylene glycol powder (GLYCOLAX/MIRALAX) 17 GM/SCOOP powder Take 1 capful with water (17 g) by mouth daily. 238 g 0   promethazine (PHENERGAN) 12.5 MG tablet Take 1 tablet (12.5 mg total) by mouth every 6 (six) hours as needed for nausea, vomiting or refractory nausea / vomiting. 30 tablet 0   Propylene Glycol (SYSTANE COMPLETE) 0.6 % SOLN Place 1 drop into both eyes daily as needed (dry  eyes).     vancomycin IVPB Inject 1,000 mg into the vein daily. Indication:  L-ankle oste First Dose: Yes Last Day of Therapy:  08/27/21 Labs - Sunday/Monday:  CBC/D, BMP, and vancomycin trough. Labs - Thursday:  BMP and vancomycin trough Labs - Every other week:  ESR and CRP Method of administration:Elastomeric Method of administration may be changed at the discretion of the patient and/or caregiver's ability to self-administer the medication ordered. 41 Units 0   No current facility-administered medications for this visit.    Allergies  Allergen Reactions   Strawberry Extract Itching, Swelling and Anaphylaxis    Mouth swells and gets itchy     REVIEW OF SYSTEMS:  _0  denotes positive finding, _1  denotes negative finding Cardiac  Comments:  Chest pain or chest pressure:    Shortness of breath upon exertion:    Short of breath when lying flat:    Irregular heart rhythm:        Vascular    Pain in calf, thigh, or hip brought on by ambulation:    Pain in feet at night that wakes you up from your sleep:     Blood clot in your veins:    Leg swelling:         Pulmonary    Oxygen at home:    Productive cough:     Wheezing:         Neurologic    Sudden weakness in arms or legs:     Sudden numbness in arms or legs:     Sudden onset of difficulty speaking or slurred speech:    Temporary loss of vision in one eye:     Problems with dizziness:         Gastrointestinal    Blood in stool:     Vomited blood:         Genitourinary    Burning when urinating:     Blood in urine:  Psychiatric    Major depression:         Hematologic    Bleeding problems:    Problems with blood clotting too easily:        Skin    Rashes or ulcers:        Constitutional    Fever or chills:      PHYSICAL EXAMINATION:  Vitals:   08/27/21 0904  BP: 136/88  Pulse: 69  Resp: 18  Temp: 97.7 F (36.5 C)  TempSrc: Temporal  SpO2: 98%  Weight: 209 lb 1.6 oz (94.8 kg)  Height:  _0  (1.702 m)    General:  WDWN in NAD; vital signs documented above Gait: Not observed HENT: WNL, normocephalic Pulmonary: normal non-labored breathing , without wheezing Cardiac: regular HR, without  Murmurs without carotid bruit Abdomen: obese, soft Vascular Exam/Pulses:  Right Left  Radial 2+ (normal) 2+ (normal)  Femoral 2+ (normal) 2+ (normal)  Popliteal Not palpable Not palpable  DP 2+ (normal) 2+ (normal)  PT 1+ (weak) 1+ (weak)   Extremities: without ischemic changes, without Gangrene , without cellulitis; with open wound of left lateral ankle as shown below. Very superficial and healing well   Musculoskeletal: no muscle wasting or atrophy  Neurologic: A&O X 3;  No focal weakness or paresthesias are detected Psychiatric:  The pt has Normal affect.   Non-Invasive Vascular Imaging:   +-------+-----------+-----------+------------+------------+  ABI/TBIToday's ABIToday's TBIPrevious ABIPrevious TBI  +-------+-----------+-----------+------------+------------+  Right  0.92       0.58       0.57        0.29          +-------+-----------+-----------+------------+------------+  Left   1.09       0.61       0.51        0.25          +-------+-----------+-----------+------------+------------+   +----------+--------+-----+---------------+---------+--------------+  LEFT      PSV cm/sRatioStenosis       Waveform Comments        +----------+--------+-----+---------------+---------+--------------+  CFA Distal120                         triphasic                +----------+--------+-----+---------------+---------+--------------+  DFA       98                          biphasic                 +----------+--------+-----+---------------+---------+--------------+  POP Prox  248          50-74% stenosistriphasicHunter's canal  +----------+--------+-----+---------------+---------+--------------+  POP Distal152                          triphasic                +----------+--------+-----+---------------+---------+--------------+  TP Trunk  132                         triphasic                +----------+--------+-----+---------------+---------+--------------+    Left Stent(s):  +---------------+--------+--------+--------+--------+  proxl SFA      PSV cm/sStenosisWaveformComments  +---------------+--------+--------+--------+--------+  Prox to Stent  107             biphasic          +---------------+--------+--------+--------+--------+  Proximal Stent 91              biphasic          +---------------+--------+--------+--------+--------+  Mid Stent      113             biphasic          +---------------+--------+--------+--------+--------+  Distal Stent   148             biphasic          +---------------+--------+--------+--------+--------+  Distal to Stent140             biphasic          +---------------+--------+--------+--------+--------+   +-----------------+--------+--------+---------+--------+  mid to distal SFAPSV cm/sStenosisWaveform Comments  +-----------------+--------+--------+---------+--------+  Prox to Stent    171             biphasic           +-----------------+--------+--------+---------+--------+  Proximal Stent   161             biphasic           +-----------------+--------+--------+---------+--------+  Mid Stent        84              triphasic          +-----------------+--------+--------+---------+--------+  Distal Stent     82              triphasic          +-----------------+--------+--------+---------+--------+  Distal to Stent  113             triphasic          +-----------------+--------+--------+---------+--------+   Summary:  Left: Patent left SFA stents. 50-74% stenosis in the Hunter's canal.    ASSESSMENT/PLAN:: 71 y.o. female here for follow up for PAD. She is doing well post intervention.   She is s/p Left lower extremity arteriogram with Left SFA angioplasty with stent placement, Left anterior tibial artery angioplasty, Left TP trunk angioplasty and Left posterior tibial artery angioplasty by [name] on 07/11/21. This was performed secondary to drop in her non invasive studies as well as new left ankle wound. Her LLE is well perfused and warm. Her Duplex shows patent left SFA stent and patent left AT, TPT, and PT. She has some noted elevated velocities in the Above knee popliteal but recommend just watching this. Her ABIs improved significantly post revascularization. Her left leg wound is healing well. She will see [name] for continued wound care. She is finishing her course of IV antibiotics today.  - Continue Eliquis, Plavix, Statin - she will follow up in 3 months with LLE Arterial duplex and ABI - She knows to follow up sooner if she has new or worsening symptoms  [name], PA-C [facility] [phone]  Clinic MD: Carlis Abbott

## 2021-08-21 NOTE — Patient Instructions (Signed)
Medication Instructions:  Stop Fish Oil. *If you need a refill on your cardiac medications before your next appointment, please call your pharmacy*   Lab Work: No Labs If you have labs (blood work) drawn today and your tests are completely normal, you will receive your results only by: [place] (if you have MyChart) OR A paper copy in the mail If you have any lab test that is abnormal or we need to change your treatment, we will call you to review the results.   Testing/Procedures: No Testing   Follow-Up: At [facility], you and your health needs are our priority.  As part of our continuing mission to provide you with exceptional heart care, we have created designated Provider Care Teams.  These Care Teams include your primary Cardiologist (physician) and Advanced Practice Providers (APPs -  Physician Assistants and Nurse Practitioners) who all work together to provide you with the care you need, when you need it.  We recommend signing up for the patient portal called "MyChart".  Sign up information is provided on this After Visit Summary.  MyChart is used to connect with patients for Virtual Visits (Telemedicine).  Patients are able to view lab/test results, encounter notes, upcoming appointments, etc.  Non-urgent messages can be sent to your provider as well.   To learn more about what you can do with MyChart, go to [email or web address].    Your next appointment:   6 month(s)  The format for your next appointment:   In Person  Provider:   Glenetta Hew, MD       Important Information About Sugar

## 2021-08-27 ENCOUNTER — Ambulatory Visit (INDEPENDENT_AMBULATORY_CARE_PROVIDER_SITE_OTHER): Payer: Medicare Other | Admitting: Physician Assistant

## 2021-08-27 ENCOUNTER — Ambulatory Visit (HOSPITAL_COMMUNITY)
Admission: RE | Admit: 2021-08-27 | Discharge: 2021-08-27 | Disposition: A | Discharge status: Home / Self Care | Payer: Medicare Other | Source: Ambulatory Visit | Attending: Vascular Surgery | Admitting: Vascular Surgery

## 2021-08-27 ENCOUNTER — Encounter: Payer: Self-pay | Admitting: Physician Assistant

## 2021-08-27 ENCOUNTER — Ambulatory Visit (INDEPENDENT_AMBULATORY_CARE_PROVIDER_SITE_OTHER)
Admission: RE | Admit: 2021-08-27 | Discharge: 2021-08-27 | Disposition: A | Discharge status: Home / Self Care | Payer: Medicare Other | Source: Ambulatory Visit | Attending: Vascular Surgery | Admitting: Vascular Surgery

## 2021-08-27 VITALS — BP 136/88 | HR 69 | Temp 97.7°F | Resp 18 | Ht 67.0 in | Wt 209.1 lb

## 2021-08-27 DIAGNOSIS — I739 Peripheral vascular disease, unspecified: Secondary | ICD-10-CM

## 2021-08-27 DIAGNOSIS — L97322 Non-pressure chronic ulcer of left ankle with fat layer exposed: Secondary | ICD-10-CM

## 2021-08-27 LAB — ACID FAST CULTURE WITH REFLEXED SENSITIVITIES (MYCOBACTERIA)
Acid Fast Culture: NEGATIVE
Acid Fast Culture: NEGATIVE

## 2021-08-30 ENCOUNTER — Ambulatory Visit (INDEPENDENT_AMBULATORY_CARE_PROVIDER_SITE_OTHER): Payer: Medicare Other | Admitting: Podiatry

## 2021-08-30 ENCOUNTER — Ambulatory Visit (INDEPENDENT_AMBULATORY_CARE_PROVIDER_SITE_OTHER): Payer: Medicare Other

## 2021-08-30 DIAGNOSIS — M79671 Pain in right foot: Secondary | ICD-10-CM

## 2021-08-30 DIAGNOSIS — M79672 Pain in left foot: Secondary | ICD-10-CM

## 2021-08-30 DIAGNOSIS — B351 Tinea unguium: Secondary | ICD-10-CM

## 2021-08-30 DIAGNOSIS — M79675 Pain in left toe(s): Secondary | ICD-10-CM

## 2021-08-30 DIAGNOSIS — M79674 Pain in right toe(s): Secondary | ICD-10-CM

## 2021-08-30 DIAGNOSIS — Z9889 Other specified postprocedural states: Secondary | ICD-10-CM

## 2021-08-30 DIAGNOSIS — M86172 Other acute osteomyelitis, left ankle and foot: Secondary | ICD-10-CM

## 2021-08-30 NOTE — Progress Notes (Signed)
Subjective: Tammy Good is a 71 y.o. is seen today in office s/p left foot wound debridement, bone biopsy.  She finished her antibiotics on Wednesday and the PICC line was removed yesterday.  She states that she has been feeling well.  Denies any fevers or chills.  She is continue with every other day dressing changes.    Objective: General: No acute distress, AAOx3  DP/PT pulses palpable, CRT < 3 sec to all digits.  Left ankle: Superficial ulceration of the lateral aspect left ankle which appears to be filling in.  There is no probing, undermining or tunneling.  There is superficial with any surrounding erythema, ascending cellulitis.  There is no fluctuance or crepitation but there is no.  Overall the wound appears to be healing well.  Incisions are also well-healed at this time. No pain with calf compression, swelling, warmth, erythema.        Assessment and Plan:  Status post left ankle incision and drainage, bone biopsy; osteomyelitis; symptomatic onychomycosis -X-rays were obtained and reviewed with the patient.  3 views of the left foot and ankle were obtained.  There is no definitive evidence of acute fracture or acute osteomyelitis.  There is some periosteal reaction noted on the fibula which is chronic at this time.  No evidence of acute osteomyelitis.  No soft tissue edema. -She has finished her course of antibiotics.  At this time we need to observe very closely off of antibiotics to see if the infection would recur.  She has a follow-up scheduled infectious disease next week.  She is in continue with every other day dressing changes of the left ankle wound.  This appears to be healing well.  No obvious signs of infection clinically today.  Encouraged elevation. -Monitor for any clinical signs or symptoms of infection and directed to call the office immediately should any occur or go to the ER.  RTC 2 weeks or sooner if needed  Trula Slade DPM

## 2021-09-01 ENCOUNTER — Encounter: Payer: Self-pay | Admitting: Orthopedic Surgery

## 2021-09-01 NOTE — Progress Notes (Signed)
Office Visit Note   Patient: Tammy Good           Date of Birth: Jun 06, 1950           MRN: [id number] Visit Date: 07/30/2021              Requested by: [name], MD [address] [place],  [place] [address] PCP: [name], MD  Chief Complaint  Patient presents with   Left Foot - Follow-up   Left Ankle - Follow-up      HPI: Patient is a 71 year old woman who is seen in follow-up for left lower extremity she is status post endovascular revascularization to the left lower extremity she has had an MRI scan which showed osteomyelitis of the calcaneus cuboid talar head and navicular.  Patient is status post left calcaneal biopsy with Dr. Earleen Newport on 07/15/2021.  Cultures are currently negative.  Patient is currently receiving vancomycin through a PICC line.  She is weightbearing with a cane and postoperative shoe.  Assessment & Plan: Visit Diagnoses:  1. Acute osteomyelitis of left calcaneus (HCC)     Plan: Patient is given a compression sock for the swelling.  Do not see an indication for surgical intervention at this time will reevaluate after patient has completed her IV antibiotics.  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  Follow-Up Instructions: Return in about 3 months (around 10/30/2021).   Ortho Exam  Patient is alert, oriented, no adenopathy, well-dressed, normal affect, normal respiratory effort. Examination patient is a strong palpable pulse the sutures are intact medially and laterally from her biopsy.  There is some swelling there is no cellulitis no drainage.  No tenderness to palpation.  Imaging: No results found. No images are attached to the encounter.  Labs: Lab Results  Component Value Date   HGBA1C 7.2 (H) 04/23/2021   HGBA1C 6.7 (H) 11/15/2020   HGBA1C 6.9 (H) 09/02/2013   ESRSEDRATE 105 (H) 07/07/2021   ESRSEDRATE 27 07/03/2021   ESRSEDRATE 25 05/24/2021   CRP 35.3 (H) 07/07/2021   CRP 93 (H) 07/03/2021    CRP 27.9 (H) 05/24/2021   REPTSTATUS 07/20/2021 FINAL 07/15/2021   GRAMSTAIN NO WBC SEEN NO ORGANISMS SEEN  07/15/2021   CULT  07/15/2021    No growth aerobically or anaerobically. Performed at [facility], [place] [address], , [place] [address]    LABORGA METHICILLIN RESISTANT STAPHYLOCOCCUS AUREUS 04/24/2021     Lab Results  Component Value Date   ALBUMIN 2.4 (L) 07/07/2021   ALBUMIN 3.3 (L) 07/03/2021   ALBUMIN 2.7 (L) 06/04/2021   PREALBUMIN 5.5 (L) 04/23/2021    Lab Results  Component Value Date   MG 1.8 07/15/2021   MG 2.0 06/04/2021   MG 1.7 05/28/2021   No results found for: "VD25OH"  Lab Results  Component Value Date   PREALBUMIN 5.5 (L) 04/23/2021      Latest Ref Rng & Units 07/17/2021    1:25 AM 07/16/2021   12:17 AM 07/15/2021    2:06 AM  CBC EXTENDED  WBC 4.0 - 10.5 K/uL 7.3  6.4  7.2   RBC 3.87 - 5.11 MIL/uL 3.29  3.21  3.24   Hemoglobin 12.0 - 15.0 g/dL 8.9  8.6  8.6   HCT 36.0 - 46.0 % 28.5  28.1  28.4   Platelets 150 - 400 K/uL 472  452  450      There is no height or weight on  file to calculate BMI.  Orders:  No orders of the defined types were placed in this encounter.  No orders of the defined types were placed in this encounter.    Procedures: No procedures performed  Clinical Data: No additional findings.  ROS:  All other systems negative, except as noted in the HPI. Review of Systems  Objective: Vital Signs: There were no vitals taken for this visit.  Specialty Comments:  No specialty comments available.  PMFS History: Patient Active Problem List   Diagnosis Date Noted   Atrial fibrillation ([place]) 08/15/2021   Septic arthritis of foot ([place]) 08/02/2021   PICC (peripherally inserted central catheter) in place 08/02/2021   Medication monitoring encounter 08/02/2021   Acute on chronic systolic CHF (congestive heart failure) ([place]) 06/02/2021   Hypothyroidism 06/02/2021   Ulcer of esophagus without bleeding     Pressure injury of skin 05/31/2021   Hypokalemia 05/30/2021   Symptomatic anemia 05/29/2021   AKI (acute kidney injury) ([place]) 05/29/2021   GI bleed 05/29/2021   Liver lesion 05/29/2021   Vertigo 05/24/2021   Generalized weakness 05/24/2021   Tenosynovitis of left ankle 04/25/2021   Thrombocytosis 04/23/2021   Fatty liver 06/19/2020   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 ([place]) 85/63/1497   Atherosclerotic heart disease of native coronary artery without angina pectoris 03/20/2020   Epigastric pain 03/20/2020   Nausea 03/20/2020   Absence of toe ([place]) 06/28/2019   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   PAD (peripheral artery disease) ([place]) 05/26/2019   Osteomyelitis of left foot ([place]) 05/24/2019   Cellulitis and abscess of foot, except toes 05/18/2019   Chronic osteomyelitis of ankle and foot ([place]) 05/18/2019   Cholesteatoma 02/24/2019   Cellulitis and abscess of toe 11/09/2018   Colon cancer screening 11/30/2017   Long term (current) use of insulin ([place]) 03/05/2017   Iron deficiency anemia 12/10/2014   Obesity (BMI 30-39.9) 11/17/2013   Diabetes mellitus, type 2 ([place])    Essential hypertension    Right thigh pain 11/01/2013   PVC's (premature ventricular contractions) 11/01/2013   Status post insertion of drug eluting coronary artery stent to [facility] emergently and PTCA to prox. 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   Hyperlipidemia with target LDL less than 70    Hypothyroidism (acquired)    Mild essential hypertension    Obesity (BMI 30-39.9) 11/17/2013   PAD (peripheral artery disease) ([place]) 05/26/2019   05/26/19: Abd AoGram- BLE runoff -> L SFA orbital atherectomy - PTA w/ DEB & Stent 6 x 40 Luttonix (for focal dissection) - patent Pop A with 3 V runoff. LEA Dopplers 01/03/2020: RABI (prev) 0.68 (0.53)/ RTBI (prev) 0.58 (0.33); LABI (prev) 0.80 (0.64),  LTBI (prev) 0.64 (0.51); R mSFA ~50-74%, L mSFA 50-74%. Patent Prox SFA stent < 49% stenosis   Panhypopituitarism (HCC)    ST elevation myocardial infarction (STEMI) of inferior wall, subsequent episode of care ([place]) 08/2013   80% branch of D1, 40% mid AV groove circumflex, 100% RCA with subacute thrombus -- thrombus extending into RPA V with 100% occlusion after initial angioplasty of mid RCA ;; Post MI ECHO 6/9/'15: EF 50-55%, mild LVH with moderate HK of inferior wall, Gr1 DD, mild LA dilation; mildly reduced RV function    Family History  Problem Relation Age of Onset   Cancer Mother 61       multiple myeloma   Heart attack Father 53   Cancer Sister    Alzheimer's disease Maternal Grandmother     Past Surgical History:  Procedure Laterality Date   ABDOMINAL AORTOGRAM W/LOWER EXTREMITY N/A 05/26/2019   Procedure: ABDOMINAL AORTOGRAM W/LOWER EXTREMITY;  Surgeon: Marty Heck, MD;  Location: [facility];  Service: Cardiovascular;  Laterality: N/A;   ABDOMINAL AORTOGRAM W/LOWER EXTREMITY N/A 11/15/2020   Procedure: ABDOMINAL AORTOGRAM W/LOWER EXTREMITY;  Surgeon: Marty Heck, MD;  Location: [facility];  Service: Cardiovascular;  Laterality: N/A;   ABDOMINAL AORTOGRAM W/LOWER EXTREMITY N/A 04/18/2021   Procedure: ABDOMINAL AORTOGRAM  W/LOWER EXTREMITY;  Surgeon: Marty Heck, MD;  Location: [facility];  Service: Cardiovascular;  Laterality: N/A;   ABDOMINAL AORTOGRAM W/LOWER EXTREMITY Left 07/11/2021   Procedure: ABDOMINAL AORTOGRAM W/LOWER EXTREMITY;  Surgeon: Marty Heck, MD;  Location: [facility];  Service: Cardiovascular;  Laterality: Left;   AMPUTATION Left 05/25/2019   Procedure: AMPUTATION RAY 5th;  Surgeon: Trula Slade, DPM;  Location: [facility];  Service: Podiatry;  Laterality: Left;   BONE BIOPSY Left 04/24/2021   Procedure: BONE BIOPSY;  Surgeon: Landis Martins, DPM;  Location: [facility];  Service: Podiatry;  Laterality: Left;  left ankle/fibula   BONE BIOPSY Left 07/15/2021   Procedure: LEFT FOOT BONE BIOPSY;  Surgeon: Trula Slade, DPM;  Location: [facility];  Service: Podiatry;  Laterality: Left;   Cardiac Event Monitor  July-August 2015   Sinus rhythm with PVCs   COLONOSCOPY N/A 08/31/2013   Procedure: COLONOSCOPY;  Surgeon: Juanita Craver, MD;  Location: [facility] ENDOSCOPY;  Service: Endoscopy;  Laterality: N/A;   CORONARY STENT INTERVENTION N/A 11/19/2020   Procedure: CORONARY STENT INTERVENTION;  Surgeon: Nelva Bush, MD;  Location: [facility];  Service: Cardiovascular;  Laterality: N/A;   ESOPHAGOGASTRODUODENOSCOPY N/A 09/01/2013   Procedure: ESOPHAGOGASTRODUODENOSCOPY (EGD);  Surgeon: Beryle Beams, MD;  Location: [facility] ENDOSCOPY;  Service: Endoscopy;  Laterality: N/A;  bedside   ESOPHAGOGASTRODUODENOSCOPY (EGD) WITH PROPOFOL N/A 05/26/2021   Procedure: ESOPHAGOGASTRODUODENOSCOPY (EGD) WITH PROPOFOL;  Surgeon: Carol Ada, MD;  Location: [facility];  Service: Gastroenterology;  Laterality: N/A;   INCISION AND DRAINAGE Left 04/24/2021   Procedure: INCISION AND DRAINAGE;  Surgeon: Landis Martins, DPM;  Location: [facility];  Service: Podiatry;  Laterality: Left;   IRRIGATION AND DEBRIDEMENT FOOT Left 07/15/2021   Procedure: IRRIGATION AND DEBRIDEMENT FOOT;  Surgeon: Trula Slade, DPM;  Location: [facility];  Service: Podiatry;  Laterality: Left;   LEFT HEART CATH AND CORONARY ANGIOGRAPHY N/A 11/19/2020   Procedure: LEFT HEART CATH AND CORONARY ANGIOGRAPHY;  Surgeon: Nelva Bush, MD;  Location: [facility];  Service: Cardiovascular;  Laterality: N/A;   LEFT HEART CATHETERIZATION WITH CORONARY ANGIOGRAM N/A 08/30/2013   Procedure: LEFT HEART  CATHETERIZATION WITH CORONARY ANGIOGRAM;  Surgeon: Leonie Man, MD;  Location: [facility] CATH LAB: 100% mRCA (thrombus - extends to RPAV), 80% D1, 40% AVG Cx.   PERCUTANEOUS CORONARY STENT INTERVENTION (PCI-S)  08/30/2013   Procedure: PERCUTANEOUS CORONARY STENT INTERVENTION (PCI-S);  Surgeon: Leonie Man, MD;  Location: [facility] CATH LAB;  Integrity Resolute DES 2.0 mm x 38 mm -- 3.35 mm.; PTCA of proximal RPA V. - 3.0 mm x 15 mm balloon   PERIPHERAL VASCULAR ATHERECTOMY  05/26/2019   Procedure: PERIPHERAL VASCULAR ATHERECTOMY;  Surgeon: Marty Heck, MD;  Location: [facility];  Service: Cardiovascular;;  Left SFA   PERIPHERAL VASCULAR BALLOON ANGIOPLASTY Left 07/11/2021   Procedure: PERIPHERAL VASCULAR BALLOON ANGIOPLASTY;  Surgeon: Marty Heck, MD;  Location: [facility];  Service: Cardiovascular;  Laterality: Left;  PT TRUNK / AT   PERIPHERAL VASCULAR INTERVENTION  05/26/2019   Procedure: PERIPHERAL VASCULAR INTERVENTION;  Surgeon: Marty Heck, MD;  Location: [facility];  Service: Cardiovascular;;  Left SFA   PERIPHERAL VASCULAR INTERVENTION Right 11/15/2020   Procedure: PERIPHERAL VASCULAR INTERVENTION;  Surgeon: Marty Heck, MD;  Location: [facility];  Service: Cardiovascular;  Laterality: Right;  Superficial Femoral Artery   PERIPHERAL VASCULAR INTERVENTION Left 07/11/2021   Procedure: PERIPHERAL VASCULAR INTERVENTION;  Surgeon: Marty Heck, MD;  Location: [facility];  Service: Cardiovascular;  Laterality: Left;  SFA   PITUITARY SURGERY      TEE WITHOUT CARDIOVERSION N/A 04/26/2021   Procedure: TRANSESOPHAGEAL ECHOCARDIOGRAM (TEE);  Surgeon: Buford Dresser, MD;  Location: [facility] ENDOSCOPY;  Service: Cardiovascular;  Laterality: N/A;   TRANSTHORACIC ECHOCARDIOGRAM  08/30/2013   mild LVH. EF 50-55%. Moderate HK of the entire inferior myocardium. GR 1 DD. Mild LA dilation. Mildly reduced RV function   TYMPANOMASTOIDECTOMY Right 12/28/2017   Procedure: RIGHT TYMPANOMASTOIDECTOMY;  Surgeon: Leta Baptist, MD;  Location: [facility];  Service: ENT;  Laterality: Right;   Social History   Occupational History   Not on file  Tobacco Use   Smoking status: Former   Smokeless tobacco: Never  Substance and Sexual Activity   Alcohol use: No   Drug use: No   Sexual activity: Not Currently    Birth control/protection: Post-menopausal

## 2021-09-02 ENCOUNTER — Other Ambulatory Visit: Payer: Self-pay | Admitting: *Deleted

## 2021-09-02 DIAGNOSIS — L02619 Cutaneous abscess of unspecified foot: Secondary | ICD-10-CM

## 2021-09-02 DIAGNOSIS — I739 Peripheral vascular disease, unspecified: Secondary | ICD-10-CM

## 2021-09-03 ENCOUNTER — Encounter: Payer: Self-pay | Admitting: Internal Medicine

## 2021-09-03 ENCOUNTER — Ambulatory Visit (INDEPENDENT_AMBULATORY_CARE_PROVIDER_SITE_OTHER): Payer: Medicare Other | Admitting: Internal Medicine

## 2021-09-03 ENCOUNTER — Other Ambulatory Visit: Payer: Self-pay

## 2021-09-03 VITALS — BP 119/71 | HR 85 | Temp 97.9°F | Resp 16 | Wt 205.0 lb

## 2021-09-03 DIAGNOSIS — I2510 Atherosclerotic heart disease of native coronary artery without angina pectoris: Secondary | ICD-10-CM

## 2021-09-03 DIAGNOSIS — Z5181 Encounter for therapeutic drug level monitoring: Secondary | ICD-10-CM

## 2021-09-03 DIAGNOSIS — M86672 Other chronic osteomyelitis, left ankle and foot: Secondary | ICD-10-CM

## 2021-09-03 DIAGNOSIS — Z452 Encounter for adjustment and management of vascular access device: Secondary | ICD-10-CM | POA: Diagnosis not present

## 2021-09-03 NOTE — Progress Notes (Signed)
   Subjective:    Patient ID: Tammy Good, female    DOB: 1950-12-02, 71 y.o.   MRN: [id number]  HPI Here for follow up of osteomyelitis.  She previous notes for history.  She developed osteomyelitis of the foot and MRSA bacteremia and completed antibiotics on June 6th with removal of the picc line.  Incision healed and no new concerns.  Inflammatory markers have normalized.  She feels well.    Review of Systems  Constitutional:  Negative for chills and fever.  Gastrointestinal:  Negative for diarrhea.  Skin:  Negative for rash.       Objective:   Physical Exam Eyes:     General: No scleral icterus. Pulmonary:     Effort: Pulmonary effort is normal.  Skin:    Findings: No rash.  Neurological:     General: No focal deficit present.     Mental Status: She is alert.   SH: no tobacco        Assessment & Plan:

## 2021-09-03 NOTE — Assessment & Plan Note (Signed)
picc has been removed and no concerns.

## 2021-09-03 NOTE — Assessment & Plan Note (Signed)
She has done well with treatment and now completed a prolonged course of antibiotics.  At this point will recheck her ESR and CRP and if they remain relatively flat, will continue off of antibiotics.  She can return as needed.

## 2021-09-03 NOTE — Assessment & Plan Note (Signed)
Creat and LFTs have remained wnl.

## 2021-09-04 LAB — C-REACTIVE PROTEIN: CRP: 17.6 mg/L — ABNORMAL HIGH (ref <8.0)

## 2021-09-04 LAB — SEDIMENTATION RATE: Sed Rate: 14 mm/h (ref 0–30)

## 2021-09-16 ENCOUNTER — Telehealth: Payer: Self-pay | Admitting: Podiatry

## 2021-09-16 LAB — ACID FAST CULTURE WITH REFLEXED SENSITIVITIES (MYCOBACTERIA): Acid Fast Culture: NEGATIVE

## 2021-09-19 ENCOUNTER — Ambulatory Visit (INDEPENDENT_AMBULATORY_CARE_PROVIDER_SITE_OTHER): Payer: Medicare Other | Admitting: Podiatry

## 2021-09-19 DIAGNOSIS — M86172 Other acute osteomyelitis, left ankle and foot: Secondary | ICD-10-CM

## 2021-09-22 NOTE — Progress Notes (Signed)
Subjective: Tammy Good is a 71 y.o. is seen today in office s/p left foot wound debridement, bone biopsy.  She states he been doing very well.  She is continue with dressing changes.  No drainage or pus or any swelling or redness.  She has been off of antibiotics.  She has no new concerns.  Objective: General: No acute distress, AAOx3  DP/PT pulses palpable, CRT < 3 sec to all digits.  Left ankle: Ulceration lateral aspect the ankle appears to be healed.  There are some callus formation overlying the area but upon debridement there is no open lesion, drainage or pus or any signs of infection.  No fluctuation or potation.  No malodor.  The other incisions are also healed.  Mild edema of the foot and ankle but no erythema or warmth.  No pain with calf compression, swelling, warmth, erythema.         Assessment and Plan:  Status post left ankle incision and drainage, bone biopsy; osteomyelitis  -Sharp debrided the hyperkeratotic lesion without any complications or bleeding.  Appears to be healed.  Recommend moisturizer daily.  She can still continue offloading.  Clinically she appears to be doing well.  Her last CRP was 17.6 which was down to 35.3.  We will recheck this in about 2 weeks and order was given to her today to get this done at that time.  She is to monitor very closely for any signs or symptoms of reoccurrence of infection.  Glucose control.  Trula Slade DPM

## 2021-10-09 LAB — C-REACTIVE PROTEIN: CRP: 77 mg/L — ABNORMAL HIGH (ref 0–10)

## 2021-10-09 LAB — SEDIMENTATION RATE: Sed Rate: 26 mm/hr (ref 0–40)

## 2021-10-10 ENCOUNTER — Ambulatory Visit (INDEPENDENT_AMBULATORY_CARE_PROVIDER_SITE_OTHER): Payer: Medicare Other | Admitting: Podiatry

## 2021-10-10 DIAGNOSIS — M86172 Other acute osteomyelitis, left ankle and foot: Secondary | ICD-10-CM

## 2021-10-10 MED ORDER — CLINDAMYCIN HCL 300 MG PO CAPS: 300.0000 mg | ORAL_CAPSULE | Freq: Three times a day (TID) | ORAL | 0 refills | Status: DC

## 2021-10-11 ENCOUNTER — Telehealth: Payer: Self-pay | Admitting: *Deleted

## 2021-10-11 MED ORDER — CLINDAMYCIN HCL 300 MG PO CAPS: 300.0000 mg | ORAL_CAPSULE | Freq: Three times a day (TID) | ORAL | 0 refills | Status: DC

## 2021-10-11 NOTE — Telephone Encounter (Signed)
Patient calling for status of medication, not at pharmacy, spoke with pharmacist, had not received any prescriptions for patient.  Resent the medication(clindamycin), they will contact patient when ready for pickup.

## 2021-10-13 NOTE — Progress Notes (Signed)
Subjective: Tammy Good is a 71 y.o. is seen today in office s/p left foot wound debridement, bone biopsy.  See that she has been doing well.  She still gets some swelling but has not worsened.  She has not seen any open lesions or any redness.  She has no pain.  Describes some occasional cramping sensations.  Denies any fevers or chills.  No other concerns.    Objective: General: No acute distress, AAOx3  DP/PT pulses palpable, CRT < 3 sec to all digits.  Left ankle: Ulceration lateral aspect the ankle appears to be healed.  Lesions on prior surgery are also well-healed.  There are some chronic edema present to the foot and ankle but there is no erythema or warmth.  There is no open lesions.  There is no areas of fluctuance or crepitation.  No malodor. No pain with calf compression, swelling, warmth, erythema.     Assessment and Plan:  Status post left ankle incision and drainage, bone biopsy; osteomyelitis  -There is no significant tissue to debride today. -Reviewed blood work.  Sed rate is normal at 26, CRP is elevated at 77.  Clinically she seems to be doing well but given her increased CRP and we will go ahead and start antibiotics.  Prescribed clindamycin.  We will recheck blood work in a few weeks.  However she is to monitor for any signs or symptoms of infection report to emergency room should any occur.  Trula Slade DPM

## 2021-10-14 ENCOUNTER — Other Ambulatory Visit: Payer: Self-pay

## 2021-10-14 DIAGNOSIS — L97322 Non-pressure chronic ulcer of left ankle with fat layer exposed: Secondary | ICD-10-CM

## 2021-10-14 MED ORDER — CLINDAMYCIN HCL 300 MG PO CAPS: 300.0000 mg | ORAL_CAPSULE | Freq: Three times a day (TID) | ORAL | 0 refills | Status: DC

## 2021-10-17 ENCOUNTER — Other Ambulatory Visit: Payer: Self-pay | Admitting: Cardiology

## 2021-10-26 LAB — CBC WITH DIFFERENTIAL/PLATELET
Basophils Absolute: 0.1 10*3/uL (ref 0.0–0.2)
Basos: 1 %
EOS (ABSOLUTE): 0.1 10*3/uL (ref 0.0–0.4)
Eos: 1 %
Hematocrit: 30 % — ABNORMAL LOW (ref 34.0–46.6)
Hemoglobin: 9.1 g/dL — ABNORMAL LOW (ref 11.1–15.9)
Immature Grans (Abs): 0.1 10*3/uL (ref 0.0–0.1)
Immature Granulocytes: 1 %
Lymphocytes Absolute: 1.5 10*3/uL (ref 0.7–3.1)
Lymphs: 16 %
MCH: 26.8 pg (ref 26.6–33.0)
MCHC: 30.3 g/dL — ABNORMAL LOW (ref 31.5–35.7)
MCV: 89 fL (ref 79–97)
Monocytes Absolute: 0.9 10*3/uL (ref 0.1–0.9)
Monocytes: 10 %
Neutrophils Absolute: 6.7 10*3/uL (ref 1.4–7.0)
Neutrophils: 71 %
Platelets: 582 10*3/uL — ABNORMAL HIGH (ref 150–450)
RBC: 3.39 x10E6/uL — ABNORMAL LOW (ref 3.77–5.28)
RDW: 16.8 % — ABNORMAL HIGH (ref 11.7–15.4)
WBC: 9.4 10*3/uL (ref 3.4–10.8)

## 2021-10-26 LAB — SEDIMENTATION RATE: Sed Rate: 22 mm/hr (ref 0–40)

## 2021-10-26 LAB — C-REACTIVE PROTEIN: CRP: 11 mg/L — ABNORMAL HIGH (ref 0–10)

## 2021-10-29 ENCOUNTER — Ambulatory Visit (INDEPENDENT_AMBULATORY_CARE_PROVIDER_SITE_OTHER): Payer: Medicare Other | Admitting: Podiatry

## 2021-10-29 DIAGNOSIS — I2510 Atherosclerotic heart disease of native coronary artery without angina pectoris: Secondary | ICD-10-CM

## 2021-10-29 DIAGNOSIS — M86172 Other acute osteomyelitis, left ankle and foot: Secondary | ICD-10-CM | POA: Diagnosis not present

## 2021-10-29 NOTE — Progress Notes (Signed)
Subjective: Tammy Good is a 71 y.o. is seen today in office s/p left foot wound debridement, bone biopsy.  States that she is doing well.  Overall she has no pain she has not had any increase in swelling no redness or warmth that she reports.  No new open lesions.  Denies any fevers or chills.  No other concerns.   Objective: General: No acute distress, AAOx3  DP/PT pulses palpable, CRT < 3 sec to all digits.  Left ankle: Ulceration lateral aspect the ankle appears to be healed.  Scars from prior surgery are also well-healed.  There is chronic residual edema present to the ankle and foot however does not appear to worsen.  There is no erythema or warmth.  There is no areas of fluctuation or crepitation.  There is no malodor. No pain with calf compression, swelling, warmth, erythema.   Assessment and Plan:  Status post left ankle incision and drainage, bone biopsy; osteomyelitis  -There is no significant tissue to debride today. -Blood work reviewed.  CRP is now 11, sed rate is normal at 22 and white blood cell count is 9.4. -Although she has some residual edema no obvious signs of infection but she is to monitor closely.  Encouraged compression, elevation. -Monitor for any clinical signs or symptoms of infection and directed to call the office immediately should any occur or go to the ER.  Also nail debridement and possible repeat blood work next appointment if needed.  Trula Slade DPM

## 2021-10-31 ENCOUNTER — Ambulatory Visit: Payer: Medicare Other | Admitting: Orthopedic Surgery

## 2021-11-06 ENCOUNTER — Telehealth: Payer: Self-pay | Admitting: *Deleted

## 2021-11-06 NOTE — Telephone Encounter (Signed)
Patient is requesting a sooner appointment, would like to see only Dr [name]. She started having spasms, pain over night(surgery in April for toe amputation),still swelling, barely can walk on foot. She was doing a little more walking than usual the day before.

## 2021-11-06 NOTE — Telephone Encounter (Signed)
Thank you :)

## 2021-11-11 ENCOUNTER — Other Ambulatory Visit: Payer: Self-pay | Admitting: Podiatry

## 2021-11-11 ENCOUNTER — Ambulatory Visit (INDEPENDENT_AMBULATORY_CARE_PROVIDER_SITE_OTHER): Payer: Medicare Other | Admitting: Podiatry

## 2021-11-11 ENCOUNTER — Ambulatory Visit: Payer: Medicare Other

## 2021-11-11 DIAGNOSIS — M86172 Other acute osteomyelitis, left ankle and foot: Secondary | ICD-10-CM

## 2021-11-11 DIAGNOSIS — I2510 Atherosclerotic heart disease of native coronary artery without angina pectoris: Secondary | ICD-10-CM | POA: Diagnosis not present

## 2021-11-11 MED ORDER — LINEZOLID 600 MG PO TABS: 600.0000 mg | ORAL_TABLET | Freq: Two times a day (BID) | ORAL | 0 refills | Status: DC

## 2021-11-12 ENCOUNTER — Ambulatory Visit
Admission: RE | Admit: 2021-11-12 | Discharge: 2021-11-12 | Disposition: A | Discharge status: Home / Self Care | Payer: Medicare Other | Source: Ambulatory Visit | Attending: Podiatry | Admitting: Podiatry

## 2021-11-12 DIAGNOSIS — M86172 Other acute osteomyelitis, left ankle and foot: Secondary | ICD-10-CM

## 2021-11-12 LAB — CBC WITH DIFFERENTIAL/PLATELET
Basophils Absolute: 0.1 10*3/uL (ref 0.0–0.2)
Basos: 1 %
EOS (ABSOLUTE): 0.1 10*3/uL (ref 0.0–0.4)
Eos: 1 %
Hematocrit: 31.9 % — ABNORMAL LOW (ref 34.0–46.6)
Hemoglobin: 9.5 g/dL — ABNORMAL LOW (ref 11.1–15.9)
Immature Grans (Abs): 0 10*3/uL (ref 0.0–0.1)
Immature Granulocytes: 0 %
Lymphocytes Absolute: 1 10*3/uL (ref 0.7–3.1)
Lymphs: 8 %
MCH: 26.5 pg — ABNORMAL LOW (ref 26.6–33.0)
MCHC: 29.8 g/dL — ABNORMAL LOW (ref 31.5–35.7)
MCV: 89 fL (ref 79–97)
Monocytes Absolute: 1.3 10*3/uL — ABNORMAL HIGH (ref 0.1–0.9)
Monocytes: 11 %
Neutrophils Absolute: 9.3 10*3/uL — ABNORMAL HIGH (ref 1.4–7.0)
Neutrophils: 79 %
Platelets: 679 10*3/uL — ABNORMAL HIGH (ref 150–450)
RBC: 3.58 x10E6/uL — ABNORMAL LOW (ref 3.77–5.28)
RDW: 17.2 % — ABNORMAL HIGH (ref 11.7–15.4)
WBC: 11.8 10*3/uL — ABNORMAL HIGH (ref 3.4–10.8)

## 2021-11-12 LAB — C-REACTIVE PROTEIN: CRP: 46 mg/L — ABNORMAL HIGH (ref 0–10)

## 2021-11-13 NOTE — Progress Notes (Signed)
Subjective: Tammy Good is a 71 y.o. is seen today for an acute appointment.  She states that last week she started noticing some pain to her left foot that she did not experience before.  The symptoms have resolved however she was concerned about the discomfort.  No increase in swelling.  No injuries that she reports.  No new open lesions or any open lesions at this time that she notes.  No fevers or chills.   Objective: General: No acute distress, AAOx3  DP/PT pulses palpable, CRT < 3 sec to all digits.  Left ankle: Ulceration lateral aspect the ankle appears to be healed.  Scar is well-healed.  There is still chronic edema present to the ankle.  Clinically the swelling does not appear to be significantly worsened but there is no erythema there is no increased temperature compared to the contralateral extremity.  There is some discomfort minimally along the lateral aspect the foot. No pain with calf compression, swelling, warmth, erythema.   Assessment and Plan:  Status post left ankle incision and drainage, bone biopsy; osteomyelitis  -There is no significant tissue to debride today. -X-rays obtained reviewed.  3 views left foot were obtained.  There is lucencies noted along the calcaneal cuboid joint consistent with likely osteomyelitis. -Given concern x-ray changes I will order new MRI.  Also updated blood work has been ordered.  Encouraged elevation.  We will continue antibiotics for now.  I prescribed Zyvox.  Discussed there is any worsening she is to report to the emergency room.  Trula Slade DPM

## 2021-11-18 ENCOUNTER — Ambulatory Visit: Payer: Medicare Other | Admitting: Podiatry

## 2021-11-19 ENCOUNTER — Other Ambulatory Visit: Payer: Self-pay | Admitting: Cardiology

## 2021-11-22 ENCOUNTER — Other Ambulatory Visit: Payer: Self-pay | Admitting: Podiatry

## 2021-11-22 ENCOUNTER — Telehealth: Payer: Self-pay

## 2021-11-22 MED ORDER — LINEZOLID 600 MG PO TABS: 600.0000 mg | ORAL_TABLET | Freq: Two times a day (BID) | ORAL | 0 refills | Status: DC

## 2021-11-26 ENCOUNTER — Other Ambulatory Visit (HOSPITAL_COMMUNITY): Payer: Self-pay

## 2021-11-26 ENCOUNTER — Telehealth: Payer: Self-pay

## 2021-11-26 ENCOUNTER — Encounter: Payer: Self-pay | Admitting: Internal Medicine

## 2021-11-26 ENCOUNTER — Ambulatory Visit: Payer: Medicare Other | Admitting: Podiatry

## 2021-11-26 ENCOUNTER — Other Ambulatory Visit: Payer: Self-pay

## 2021-11-26 ENCOUNTER — Telehealth: Payer: Self-pay | Admitting: Pharmacy Technician

## 2021-11-26 ENCOUNTER — Ambulatory Visit (INDEPENDENT_AMBULATORY_CARE_PROVIDER_SITE_OTHER): Payer: Medicare Other | Admitting: Internal Medicine

## 2021-11-26 VITALS — BP 133/83 | HR 109 | Temp 97.9°F | Ht 67.0 in | Wt 197.0 lb

## 2021-11-26 DIAGNOSIS — M86272 Subacute osteomyelitis, left ankle and foot: Secondary | ICD-10-CM | POA: Diagnosis not present

## 2021-11-26 DIAGNOSIS — I2510 Atherosclerotic heart disease of native coronary artery without angina pectoris: Secondary | ICD-10-CM | POA: Diagnosis not present

## 2021-11-26 DIAGNOSIS — M009 Pyogenic arthritis, unspecified: Secondary | ICD-10-CM

## 2021-11-26 NOTE — Telephone Encounter (Signed)
Auth Submission: NO AUTH NEEDED Payer: MEDICARE A&B/ & BCBS Medication & CPT/J Code(s) submitted: [place] (Dalbavancin) [id number] Route of submission (phone, fax, portal):  Phone # Fax # Auth type: Buy/Bill Units/visits requested: X1 DOSE Reference number: [id number] Approval from: 11/26/21 to 03/23/22

## 2021-11-26 NOTE — Telephone Encounter (Signed)
Communicated with Jena Gauss and relayed verbal order from Dr. Linus Salmons for a one-time dose of 1500 mg dalbavancin for left foot osteomyelitis, to be administered at the infusion center.  Richardson Landry stated understanding, and that his team would complete PA and call patient to schedule.  This RN spoke with the patient in person; patient stated understanding of the plan and that she would anticipate call. Confirmed best phone for patient is [phone] (cell).  Binnie Kand, RN

## 2021-11-26 NOTE — Assessment & Plan Note (Addendum)
I have discussed my concerns with her including that it has reoccured and is in a very difficult place to cure.  It has now worsened on MRI and clinically.  Her previous MRSA culture is resistant to tetracycline and TMP.   At this point, I do think medical management with antibiotics has been ineffective to cure and her only option would be surgical management with amputation.  I did discuss this possibility with her but we did opt to start with IV dalbavancin for now x 1 dose pending further evaluation by Dr. Lucia Gaskins.   Suppressive antibiotics also not a good option with the resistance to doxycycline and TMP making any long term oral options problematic.  Linezolid and clindamycin carry toxicities when taken for prolonged periods so not a viable option.    I have also discussed this case with Dr. Lucia Gaskins who will consider amputation for her.    I have personally spent 40 minutes involved in face-to-face and non-face-to-face activities for this patient on the day of the visit. Professional time spent includes the following activities: Preparing to see the patient (review of tests), Obtaining and/or reviewing separately obtained history (admission/discharge record), Performing a medically appropriate examination and/or evaluation , Ordering medications/tests/procedures, referring and communicating with other health care professionals, Documenting clinical information in the EMR, Independently interpreting results (not separately reported), Communicating results to the patient/family/caregiver, Counseling and educating the patient/family/caregiver and Care coordination (not separately reported).

## 2021-11-26 NOTE — Telephone Encounter (Signed)
No further notes are needed

## 2021-11-26 NOTE — Progress Notes (Signed)
   Subjective:    Patient ID: Tammy Good, female    DOB: 25-Jan-1951, 71 y.o.   MRN: [id number]  HPI Here for a follow up of osteomyelitis. 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  Her inflammatory markers normalized and did well.  Over the last month or so though she has continued to have some mild increasing of her inflammatory markers and she returned to [name] who did an MRI and is notable for severe septic arthritis of the talonavicular joint and calcaneocuboid joint with osteomyelitis.  Her foot is much more swollen today and painful.  She has been on linezolid per [name] for temporary treatment.     Review of Systems  Constitutional:  Negative for chills, fatigue and fever.  Gastrointestinal:  Negative for diarrhea.  Skin:  Negative for rash.       Objective:   Physical Exam Musculoskeletal:     Comments: Left foot and ankle with notable swelling, minimal warmth.  Picture in Epic.    Neurological:     Mental Status: She is alert.           Assessment & Plan:

## 2021-11-27 LAB — BASIC METABOLIC PANEL
BUN: 10 mg/dL (ref 7–25)
CO2: 24 mmol/L (ref 20–32)
Calcium: 8.6 mg/dL (ref 8.6–10.4)
Chloride: 99 mmol/L (ref 98–110)
Creat: 0.84 mg/dL (ref 0.50–1.05)
Glucose, Bld: 181 mg/dL — ABNORMAL HIGH (ref 65–99)
Potassium: 3 mmol/L — ABNORMAL LOW (ref 3.5–5.3)
Sodium: 139 mmol/L (ref 135–146)

## 2021-11-27 NOTE — Progress Notes (Signed)
Office Note     CC:  follow up Requesting Provider:  [name], MD  HPI: Tammy Good is a 71 y.o. ([birth year]) female who presents for follow up of PAD. She most recently underwent Left lower extremity arteriogram with Left SFA angioplasty with stent placement, Left anterior tibial artery angioplasty, Left TP trunk angioplasty and Left posterior tibial artery angioplasty by [name] on 07/11/21. This was performed secondary to drop in her non invasive studies as well as new left ankle wound. At time of her last visit in June she was ambulating without pain, had no pain at rest and no new wounds. She was followed by [name] for her left ankle wound. She was finishing up her IV antibiotics  Today she returns for follow up with non invasive studies. She reports that her wounds are healed. She does still see [name]. She denies any pain on ambulation or rest. No new wounds. She does have swelling in her left ankle and foot. This has been present for a while. It is improved with elevation. At times she has associated soreness when there is a lot of swelling. She continues to ambulate with a cane.   The pt is on a statin for cholesterol management.  The pt is not on a daily aspirin.   Other AC:  Eliquis/Plavix The pt is not on medication for hypertension.   The pt  diabetic.   Tobacco hx:  Former  Past Medical History:  Diagnosis Date   CAD S/P percutaneous coronary angioplasty 08/2013   100% mRCA - PCI Integrity Resolute DES 3.0 mm x 38 mm - 3.35 mm; PTCA of RPA V 2.0 mm x 15 mm   Cholesteatoma    right   Diabetes mellitus type 2 in obese (HCC)    On insulin and Invokana   History of osteomyelitis L 5th Toe all 05/2019   s/p Partial Ray Amputation with partial closure; 6 wks Abx & LSFA Atherectomy/DEB PTA with Stent for focal dissection.   Hyperlipidemia with target LDL less than 70    Hypothyroidism (acquired)    Mild essential hypertension    Obesity (BMI 30-39.9)  11/17/2013   PAD (peripheral artery disease) ([place]) 05/26/2019   05/26/19: Abd AoGram- BLE runoff -> L SFA orbital atherectomy - PTA w/ DEB & Stent 6 x 40 Luttonix (for focal dissection) - patent Pop A with 3 V runoff. LEA Dopplers 01/03/2020: RABI (prev) 0.68 (0.53)/ RTBI (prev) 0.58 (0.33); LABI (prev) 0.80 (0.64), LTBI (prev) 0.64 (0.51); R mSFA ~50-74%, L mSFA 50-74%. Patent Prox SFA stent < 49% stenosis   Panhypopituitarism (HCC)    ST elevation myocardial infarction (STEMI) of inferior wall, subsequent episode of care ([place]) 08/2013   80% branch of D1, 40% mid AV groove circumflex, 100% RCA with subacute thrombus -- thrombus extending into RPA V with 100% occlusion after initial angioplasty of mid RCA ;; Post MI ECHO 6/9/'15: EF 50-55%, mild LVH with moderate HK of inferior wall, Gr1 DD, mild LA dilation; mildly reduced RV function    Past Surgical History:  Procedure Laterality Date   ABDOMINAL AORTOGRAM W/LOWER EXTREMITY N/A 05/26/2019   Procedure: ABDOMINAL AORTOGRAM W/LOWER EXTREMITY;  Surgeon: Marty Heck, MD;  Location: [place] [facility];  Service: Cardiovascular;  Laterality: N/A;   ABDOMINAL AORTOGRAM W/LOWER EXTREMITY N/A 11/15/2020   Procedure: ABDOMINAL AORTOGRAM W/LOWER EXTREMITY;  Surgeon: Marty Heck, MD;  Location: [place] [facility];  Service: Cardiovascular;  Laterality: N/A;  ABDOMINAL AORTOGRAM W/LOWER EXTREMITY N/A 04/18/2021   Procedure: ABDOMINAL AORTOGRAM W/LOWER EXTREMITY;  Surgeon: Marty Heck, MD;  Location: [place] [facility];  Service: Cardiovascular;  Laterality: N/A;   ABDOMINAL AORTOGRAM W/LOWER EXTREMITY Left 07/11/2021   Procedure: ABDOMINAL AORTOGRAM W/LOWER EXTREMITY;  Surgeon: Marty Heck, MD;  Location: [place] [facility];  Service: Cardiovascular;  Laterality: Left;   AMPUTATION Left 05/25/2019   Procedure: AMPUTATION RAY 5th;  Surgeon: Trula Slade, DPM;  Location: [place];  Service: Podiatry;  Laterality: Left;   BONE  BIOPSY Left 04/24/2021   Procedure: BONE BIOPSY;  Surgeon: Landis Martins, DPM;  Location: [place];  Service: Podiatry;  Laterality: Left;  left ankle/fibula   BONE BIOPSY Left 07/15/2021   Procedure: LEFT FOOT BONE BIOPSY;  Surgeon: Trula Slade, DPM;  Location: [place];  Service: Podiatry;  Laterality: Left;   Cardiac Event Monitor  July-August 2015   Sinus rhythm with PVCs   COLONOSCOPY N/A 08/31/2013   Procedure: COLONOSCOPY;  Surgeon: Juanita Craver, MD;  Location: [facility] ENDOSCOPY;  Service: Endoscopy;  Laterality: N/A;   CORONARY STENT INTERVENTION N/A 11/19/2020   Procedure: CORONARY STENT INTERVENTION;  Surgeon: Nelva Bush, MD;  Location: [place] [facility];  Service: Cardiovascular;  Laterality: N/A;   ESOPHAGOGASTRODUODENOSCOPY N/A 09/01/2013   Procedure: ESOPHAGOGASTRODUODENOSCOPY (EGD);  Surgeon: Beryle Beams, MD;  Location: [facility]COPY;  Service: Endoscopy;  Laterality: N/A;  bedside   ESOPHAGOGASTRODUODENOSCOPY (EGD) WITH PROPOFOL N/A 05/26/2021   Procedure: ESOPHAGOGASTRODUODENOSCOPY (EGD) WITH PROPOFOL;  Surgeon: Carol Ada, MD;  Location: [place];  Service: Gastroenterology;  Laterality: N/A;   INCISION AND DRAINAGE Left 04/24/2021   Procedure: INCISION AND DRAINAGE;  Surgeon: Landis Martins, DPM;  Location: [place];  Service: Podiatry;  Laterality: Left;   IRRIGATION AND DEBRIDEMENT FOOT Left 07/15/2021   Procedure: IRRIGATION AND DEBRIDEMENT FOOT;  Surgeon: Trula Slade, DPM;  Location: [place];  Service: Podiatry;  Laterality: Left;   LEFT HEART CATH AND CORONARY ANGIOGRAPHY N/A 11/19/2020   Procedure: LEFT HEART CATH AND CORONARY ANGIOGRAPHY;  Surgeon: Nelva Bush, MD;  Location: [place] [facility];  Service: Cardiovascular;  Laterality: N/A;   LEFT HEART CATHETERIZATION WITH CORONARY ANGIOGRAM N/A 08/30/2013   Procedure: LEFT HEART CATHETERIZATION WITH CORONARY ANGIOGRAM;  Surgeon: Leonie Man, MD;  Location[facility] CATH LAB: 100% mRCA (thrombus - extends to  RPAV), 80% D1, 40% AVG Cx.   PERCUTANEOUS CORONARY STENT INTERVENTION (PCI-S)  08/30/2013   Procedure: PERCUTANEOUS CORONARY STENT INTERVENTION (PCI-S);  Surgeon: Leonie Man, MD;  Location: [facility] CATH LAB;  Integrity Resolute DES 2.0 mm x 38 mm -- 3.35 mm.; PTCA of proximal RPA V. - 3.0 mm x 15 mm balloon   PERIPHERAL VASCULAR ATHERECTOMY  05/26/2019   Procedure: PERIPHERAL VASCULAR ATHERECTOMY;  Surgeon: Marty Heck, MD;  Location: [place] [facility];  Service: Cardiovascular;;  Left SFA   PERIPHERAL VASCULAR BALLOON ANGIOPLASTY Left 07/11/2021   Procedure: PERIPHERAL VASCULAR BALLOON ANGIOPLASTY;  Surgeon: Marty Heck, MD;  Location: [place] [facility];  Service: Cardiovascular;  Laterality: Left;  PT TRUNK / AT   PERIPHERAL VASCULAR INTERVENTION  05/26/2019   Procedure: PERIPHERAL VASCULAR INTERVENTION;  Surgeon: Marty Heck, MD;  Location: [place] [facility];  Service: Cardiovascular;;  Left SFA   PERIPHERAL VASCULAR INTERVENTION Right 11/15/2020   Procedure: PERIPHERAL VASCULAR INTERVENTION;  Surgeon: Marty Heck, MD;  Location: [place] [facility];  Service: Cardiovascular;  Laterality: Right;  Superficial Femoral  Artery   PERIPHERAL VASCULAR INTERVENTION Left 07/11/2021   Procedure: PERIPHERAL VASCULAR INTERVENTION;  Surgeon: Marty Heck, MD;  Location: [place] [facility];  Service: Cardiovascular;  Laterality: Left;  SFA   PITUITARY SURGERY     TEE WITHOUT CARDIOVERSION N/A 04/26/2021   Procedure: TRANSESOPHAGEAL ECHOCARDIOGRAM (TEE);  Surgeon: Buford Dresser, MD;  Location: [facility] ENDOSCOPY;  Service: Cardiovascular;  Laterality: N/A;   TRANSTHORACIC ECHOCARDIOGRAM  08/30/2013   mild LVH. EF 50-55%. Moderate HK of the entire inferior myocardium. GR 1 DD. Mild LA dilation. Mildly reduced RV function   TYMPANOMASTOIDECTOMY Right 12/28/2017   Procedure: RIGHT TYMPANOMASTOIDECTOMY;  Surgeon: Leta Baptist, MD;  Location: [place];   Service: ENT;  Laterality: Right;    Social History   Socioeconomic History   Marital status: Widowed    Spouse name: Not on file   Number of children: Not on file   Years of education: Not on file   Highest education level: Not on file  Occupational History   Not on file  Tobacco Use   Smoking status: Former   Smokeless tobacco: Never  Substance and Sexual Activity   Alcohol use: No   Drug use: No   Sexual activity: Not Currently    Birth control/protection: Post-menopausal  Other Topics Concern   Not on file  Social History Narrative   Widow. Works at [employer].   Former smoker.   Overall not very active.  Hoping to get into water aerobics class.   Social Determinants of Health   Financial Resource Strain: Not on file  Food Insecurity: Not on file  Transportation Needs: Not on file  Physical Activity: Not on file  Stress: Not on file  Social Connections: Not on file  Intimate Partner Violence: Not on file    Family History  Problem Relation Age of Onset   Cancer Mother 19       multiple myeloma   Heart attack Father 9   Cancer Sister    Alzheimer's disease Maternal Grandmother     Current Outpatient Medications  Medication Sig Dispense Refill   apixaban (ELIQUIS) 5 MG TABS tablet Take 1 tablet (5 mg total) by mouth 2 (two) times daily. 60 tablet 6   atorvastatin (LIPITOR) 40 MG tablet Take 1 tablet (40 mg total) by mouth daily at 6 PM. (Patient taking differently: Take 40 mg by mouth every evening.) 30 tablet 0   B-D UF III MINI PEN NEEDLES 31G X 5 MM MISC Inject into the skin.     clopidogrel (PLAVIX) 75 MG tablet TAKE 1 TABLET BY MOUTH DAILY 30 tablet 3   collagenase (SANTYL) 250 UNIT/GM ointment Apply 1 application. topically daily. 15 g 0   Continuous Blood Gluc Sensor (FREESTYLE LIBRE 2 SENSOR) MISC Apply topically every 14 (fourteen) days.     dapagliflozin propanediol (FARXIGA) 10 MG TABS tablet Take 10 mg by mouth every morning.      dexamethasone (DECADRON) 0.5 MG tablet Take 2 tablets (1 mg total) by mouth daily. 28 tablet 0   ferrous sulfate 325 (65 FE) MG tablet Take 325 mg by mouth every evening.     furosemide (LASIX) 20 MG tablet Take 1 tablet (20 mg total) by mouth daily as needed for fluid or edema. 30 tablet 0   insulin aspart (NOVOLOG FLEXPEN) 100 UNIT/ML FlexPen Inject 3-8 Units into the skin See admin instructions. Inject 3-8 units subcutaneously up to three times daily per sliding scale - based  on carb count     levothyroxine (SYNTHROID) 125 MCG tablet Take 1 tablet (125 mcg total) by mouth daily before breakfast. 30 tablet 2   linezolid (ZYVOX) 600 MG tablet Take 1 tablet (600 mg total) by mouth 2 (two) times daily. 14 tablet 0   metoCLOPramide (REGLAN) 5 MG tablet Take 1 tablet (5 mg total) by mouth 3 (three) times daily as needed for nausea or vomiting. Take before meals as needed, if having nausea 90 tablet 1   midodrine (PROAMATINE) 2.5 MG tablet Take 1 tablet (2.5 mg total) by mouth 2 (two) times daily with a meal. 60 tablet 0   nitroGLYCERIN (NITROSTAT) 0.4 MG SL tablet      nystatin cream (MYCOSTATIN) Apply 1 application topically 2 (two) times daily. (Patient taking differently: Apply 1 application  topically 2 (two) times daily as needed (rash).) 30 g 1   pantoprazole (PROTONIX) 40 MG tablet Take 1 tablet (40 mg total) by mouth 2 (two) times daily before a meal. 60 tablet 0   polyethylene glycol powder (GLYCOLAX/MIRALAX) 17 GM/SCOOP powder Take 1 capful with water (17 g) by mouth daily. 238 g 0   promethazine (PHENERGAN) 12.5 MG tablet Take 1 tablet (12.5 mg total) by mouth every 6 (six) hours as needed for nausea, vomiting or refractory nausea / vomiting. 30 tablet 0   Propylene Glycol (SYSTANE COMPLETE) 0.6 % SOLN Place 1 drop into both eyes daily as needed (dry eyes).     acetaminophen (TYLENOL) 500 MG tablet Take 500-1,000 mg by mouth 3 (three) times daily as needed for headache (pain).     No current  facility-administered medications for this visit.    Allergies  Allergen Reactions   Strawberry Extract Itching, Swelling and Anaphylaxis    Mouth swells and gets itchy     REVIEW OF SYSTEMS:  _0  denotes positive finding, _1  denotes negative finding Cardiac  Comments:  Chest pain or chest pressure:    Shortness of breath upon exertion:    Short of breath when lying flat:    Irregular heart rhythm:        Vascular    Pain in calf, thigh, or hip brought on by ambulation:    Pain in feet at night that wakes you up from your sleep:     Blood clot in your veins:    Leg swelling:         Pulmonary    Oxygen at home:    Productive cough:     Wheezing:         Neurologic    Sudden weakness in arms or legs:     Sudden numbness in arms or legs:     Sudden onset of difficulty speaking or slurred speech:    Temporary loss of vision in one eye:     Problems with dizziness:         Gastrointestinal    Blood in stool:     Vomited blood:         Genitourinary    Burning when urinating:     Blood in urine:        Psychiatric    Major depression:         Hematologic    Bleeding problems:    Problems with blood clotting too easily:        Skin    Rashes or ulcers:        Constitutional    Fever or chills:  PHYSICAL EXAMINATION:  Vitals:   11/28/21 0954  BP: 125/69  Pulse: 81  Temp: 97.6 F (36.4 C)  TempSrc: Temporal  SpO2: 98%  Weight: 193 lb 11.2 oz (87.9 kg)  Height: _0  (1.702 m)    General:  WDWN in NAD; vital signs documented above Gait: ambulates with cane HENT: WNL, normocephalic Pulmonary: normal non-labored breathing , without wheezing Cardiac: regular HR, without  Murmurs  Abdomen: soft, NT, no masses Vascular Exam/Pulses:  Right Left  Radial 2+ (normal) 2+ (normal)  Femoral 2+ (normal) 2+ (normal)  Popliteal 2+ (normal) 2+ (normal)  DP 2+ (normal) 2+ (normal)  PT 1+ (weak) absent   Extremities: without ischemic changes, without  Gangrene , without cellulitis; without open wounds; left leg, ankle and foot edematous Musculoskeletal: no muscle wasting or atrophy  Neurologic: A&O X 3;  No focal weakness or paresthesias are detected Psychiatric:  The pt has Normal affect.   Non-Invasive Vascular Imaging:   +-------+-----------+-----------+------------+------------+  ABI/TBIToday's ABIToday's TBIPrevious ABIPrevious TBI  +-------+-----------+-----------+------------+------------+  Right  0.97       0.57       0.92        0.58          +-------+-----------+-----------+------------+------------+  Left   0.88       0.60       1.09        0.61          +-------+-----------+-----------+------------+------------+   +----------+-------+-----+--------------+--------+-------------------------  ----+  LEFT      PSV    RatioStenosis      WaveformComments                                  cm/s                                                              +----------+-------+-----+--------------+--------+-------------------------  ----+  CFA Distal201         50-74%        biphasicmild disease observed                                  stenosis                                              +----------+-------+-----+--------------+--------+-------------------------  ----+  SFA Distal277         50-74%        biphasicAdductor canal                                        stenosis                                              +----------+-------+-----+--------------+--------+-------------------------  ----+  POP Prox  122  biphasic                                +----------+-------+-----+--------------+--------+-------------------------  ----+  POP Mid   206         50-74%        biphasicmild/moderate disease                                  stenosis              observed                         +----------+-------+-----+--------------+--------+-------------------------  ----+  POP Distal213                       biphasic                                +----------+-------+-----+--------------+--------+-------------------------  ----+  TP Trunk  192                       biphasic                                +----------+-------+-----+--------------+--------+-------------------------  ----+  ATA Distal125                       biphasic                                +----------+-------+-----+--------------+--------+-------------------------  ----+  PTA Distal106                       biphasic                                +----------+-------+-----+--------------+--------+-------------------------  ----+     Left Stent(s):  +---------------+---++++  Prox to Stent  199  +---------------+---++++  Proximal Stent 212  +---------------+---++++  Mid Stent      143  +---------------+---++++  Distal Stent   159  +---------------+---++++  Distal to Stent202  +---------------+---++++    Summary:  Left: Patent superficial femoral artery stent without hemodynamically significant stenosis.  SFA stenosis at the adductor canal suggestive of a 50-74% stenosis. Mid popliteal artery stenosis 50-74% stenosis.   ASSESSMENT/PLAN:: 71 y.o. female here for follow up for follow up of PAD. She most recently underwent Left lower extremity arteriogram with Left SFA angioplasty with stent placement, Left anterior tibial artery angioplasty, Left TP trunk angioplasty and Left posterior tibial artery angioplasty by [name] on 07/11/21. This was performed secondary to drop in her non invasive studies as well as new left ankle wound. Her wounds remain healed. She has no rest pain, tissue loss or claudication.  - Duplex today shows some recurrent left SFA stenosis both around stent and distal to the stent  - ABI is stable on the right and  slight decrease on the left - Continue Plavix, Statin and Eliquis - Encourage walking regimen - Elevate legs PRN - Based on these findings I recommend closer interval follow up. I will have her return  in 3 months with repeat ABI and LLE arterial duplex - She knows to call for earlier follow up if she develops pain on ambulation or rest or new wound   [name], PA-C [facility] MD:   Scot Dock

## 2021-11-28 ENCOUNTER — Other Ambulatory Visit: Payer: Self-pay

## 2021-11-28 ENCOUNTER — Ambulatory Visit (INDEPENDENT_AMBULATORY_CARE_PROVIDER_SITE_OTHER)
Admission: RE | Admit: 2021-11-28 | Discharge: 2021-11-28 | Disposition: A | Discharge status: Home / Self Care | Payer: Medicare Other | Source: Ambulatory Visit | Attending: Physician Assistant | Admitting: Physician Assistant

## 2021-11-28 ENCOUNTER — Ambulatory Visit (HOSPITAL_COMMUNITY)
Admission: RE | Admit: 2021-11-28 | Discharge: 2021-11-28 | Disposition: A | Discharge status: Home / Self Care | Payer: Medicare Other | Source: Ambulatory Visit | Attending: Physician Assistant | Admitting: Physician Assistant

## 2021-11-28 ENCOUNTER — Ambulatory Visit (INDEPENDENT_AMBULATORY_CARE_PROVIDER_SITE_OTHER): Payer: Medicare Other | Admitting: Physician Assistant

## 2021-11-28 ENCOUNTER — Ambulatory Visit (INDEPENDENT_AMBULATORY_CARE_PROVIDER_SITE_OTHER): Payer: Medicare Other

## 2021-11-28 VITALS — BP 125/69 | HR 81 | Temp 97.6°F | Ht 67.0 in | Wt 193.7 lb

## 2021-11-28 VITALS — BP 133/83 | HR 91 | Temp 98.2°F | Resp 18 | Ht 67.0 in | Wt 199.6 lb

## 2021-11-28 DIAGNOSIS — I739 Peripheral vascular disease, unspecified: Secondary | ICD-10-CM | POA: Diagnosis present

## 2021-11-28 DIAGNOSIS — L02619 Cutaneous abscess of unspecified foot: Secondary | ICD-10-CM

## 2021-11-28 DIAGNOSIS — L03119 Cellulitis of unspecified part of limb: Secondary | ICD-10-CM

## 2021-11-28 DIAGNOSIS — M009 Pyogenic arthritis, unspecified: Secondary | ICD-10-CM

## 2021-11-28 MED ORDER — DEXTROSE 5 % IV SOLN
1500.0000 mg | Freq: Once | INTRAVENOUS | Status: AC
  Administered 2021-11-28: 1500 mg via INTRAVENOUS
  Filled 2021-11-28: qty 75

## 2021-11-28 NOTE — Progress Notes (Signed)
Diagnosis: Septic Arthritis  Provider:  Marshell Garfinkel MD  Procedure: Infusion  IV Type: Peripheral, IV Location: R Forearm  Dalvance, Dose: 1500 mg  Infusion Start Time: 2567  Infusion Stop Time: 1450  Post Infusion IV Care: Observation period completed and Peripheral IV Discontinued  Discharge: Condition: Good, Destination: Home . AVS provided to patient.   Performed by:  Arnoldo Morale, RN

## 2021-11-29 ENCOUNTER — Ambulatory Visit (HOSPITAL_COMMUNITY): Payer: Medicare Other

## 2021-12-02 ENCOUNTER — Telehealth: Payer: Self-pay

## 2021-12-02 ENCOUNTER — Ambulatory Visit (INDEPENDENT_AMBULATORY_CARE_PROVIDER_SITE_OTHER): Payer: Medicare Other | Admitting: Podiatry

## 2021-12-02 DIAGNOSIS — E1149 Type 2 diabetes mellitus with other diabetic neurological complication: Secondary | ICD-10-CM

## 2021-12-02 DIAGNOSIS — M79675 Pain in left toe(s): Secondary | ICD-10-CM | POA: Diagnosis not present

## 2021-12-02 DIAGNOSIS — B351 Tinea unguium: Secondary | ICD-10-CM | POA: Diagnosis not present

## 2021-12-02 DIAGNOSIS — M86172 Other acute osteomyelitis, left ankle and foot: Secondary | ICD-10-CM

## 2021-12-02 DIAGNOSIS — I2510 Atherosclerotic heart disease of native coronary artery without angina pectoris: Secondary | ICD-10-CM

## 2021-12-02 DIAGNOSIS — M79674 Pain in right toe(s): Secondary | ICD-10-CM | POA: Diagnosis not present

## 2021-12-02 NOTE — Telephone Encounter (Signed)
Patient informed to reach out to Dr. [name] office

## 2021-12-02 NOTE — Progress Notes (Signed)
Subjective: Tammy Good is a 71 y.o. is seen today for follow-up evaluation of ankle swelling.  New MRI was performed which revealed septic arthritis and referred back to Dr. Lucia Gaskins.  She has followed up with infectious disease.  She states that she is not sure who she should be following up with.  Also the nails need to be trimmed as are thickened elongated and they are causing discomfort.  No swelling or redness to the toenail sites.  No open lesions that she reports.  Still having some pain to the ankle on the left.  Objective: General: No acute distress, AAOx3  DP/PT pulses palpable, CRT < 3 sec to all digits.  Left ankle: Ulceration lateral aspect the ankle appears to be healed.  Scar is well-healed.  There is edema to the left ankle and slight warmth.  There is no area of fluctuation or crepitation.  Mild discomfort to the lateral ankle. Nails are hypertrophic, dystrophic, brittle, discolored, elongated 10. No surrounding redness or drainage. Tenderness nails 1-5 bilaterally except left fifth toe which had previously been amputated. No open lesions or pre-ulcerative lesions are identified today. No pain with calf compression, swelling, warmth, erythema.   Assessment and Plan:  Status post left ankle incision and drainage, bone biopsy; osteomyelitis; onychosis  -There is no significant tissue to debride today. -Sharply debrided the nails x 9 without any complications or bleeding. -Given the infection in her ankle she has follow-up with infectious disease.  I recommended her to follow back up with Dr. Lucia Gaskins for this as well.  Is much as I like to try to save the foot with the infection that is reoccurring I am not sure this is possible.  I will defer to Dr. Lucia Gaskins.   Trula Slade DPM

## 2021-12-02 NOTE — Telephone Encounter (Signed)
Patient calling wanting to know what are her next steps regarding her treatment plan since having the Dalvance infusion. Patient reports she does not have a follow up as of right now with Dr. Lucia Gaskins. Patient will reach out to his office today as well.  Amour Trigg T Brooks Sailors

## 2021-12-02 NOTE — Patient Instructions (Signed)
Follow up with Dr. Lucia Gaskins for the left ankle

## 2021-12-06 ENCOUNTER — Telehealth: Payer: Self-pay

## 2021-12-06 NOTE — Telephone Encounter (Signed)
Patient asking how she knows the one infusion she had will work. Also advised that Dr [name] office has said they ONLY need to see patient of there is need for foot amputation. Advised her to keep appt with [facility] on 12/18/21 and Podiatry on 01/13/22. If she has any symptoms or reason to fear infection she can call us before her appt.

## 2021-12-18 ENCOUNTER — Encounter: Payer: Self-pay | Admitting: Internal Medicine

## 2021-12-18 ENCOUNTER — Ambulatory Visit (INDEPENDENT_AMBULATORY_CARE_PROVIDER_SITE_OTHER): Payer: Medicare Other | Admitting: Internal Medicine

## 2021-12-18 ENCOUNTER — Other Ambulatory Visit: Payer: Self-pay

## 2021-12-18 VITALS — BP 166/74 | HR 90 | Resp 16 | Ht 67.0 in | Wt 204.0 lb

## 2021-12-18 DIAGNOSIS — Z23 Encounter for immunization: Secondary | ICD-10-CM

## 2021-12-18 DIAGNOSIS — I2510 Atherosclerotic heart disease of native coronary artery without angina pectoris: Secondary | ICD-10-CM

## 2021-12-18 DIAGNOSIS — M86672 Other chronic osteomyelitis, left ankle and foot: Secondary | ICD-10-CM

## 2021-12-18 DIAGNOSIS — M86272 Subacute osteomyelitis, left ankle and foot: Secondary | ICD-10-CM

## 2021-12-18 DIAGNOSIS — M659 Synovitis and tenosynovitis, unspecified: Secondary | ICD-10-CM | POA: Diagnosis not present

## 2021-12-18 DIAGNOSIS — M009 Pyogenic arthritis, unspecified: Secondary | ICD-10-CM

## 2021-12-18 NOTE — Progress Notes (Signed)
   Subjective:    Patient ID: Tammy Good, female    DOB: [birth year], 71 y.o.   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  She has a history of PAD s/p left SFA angioplasty and stent placement and developed osteomyelitis of the left fifth metatarsal with MRSA infection and treated for a diabetic foot infection for 6 weeks with vancomycin then doxycycline in March 2021.  She did relatively well until January 2023 with worsening left ankle ulceration and MRI c/w osteomyelitis and MRSA again in blood cultures and treated for 6 weeks.  She developed worsening of the wound in April 2023 and placed back on appropriate antibiotics and found to have osteomyelitis of the cuboid, talar head and navicular bone and progressive osteomyelitis of the calcaneus and associated septic arthritis.  She was then treated again with 6 weeks of IV antibiotics but unfortunately returned earlier this month with further progression and I started her on dalbavancin IV.     Review of Systems  Constitutional:  Negative for chills and fever.  Gastrointestinal:  Negative for diarrhea.  Skin:  Negative for rash.       Objective:   Physical Exam Eyes:     General: No scleral icterus. Pulmonary:     Effort: Pulmonary effort is normal.  Neurological:     General: No focal deficit present.     Mental Status: She is alert.  Psychiatric:        Mood and Affect: Mood normal.   SH: no tobacco        Assessment & Plan:

## 2021-12-18 NOTE — Assessment & Plan Note (Signed)
I had a long discussion with he regarding the progressive nature of her infection with recurrence despite multiple prolonged courses of antibiotics.  I reviewed the MRI findings and attempts at medical treatment and at this point I feel she has maximized attempts at treatment medically.  I discussed the option of amputation with her as likely the only way to effectively cure this infection and she understands this and is will to proceed.  I have previously discussed this with Dr. Lucia Gaskins and I will have her referred back to him to consider amputation if felt to be appropriate by Dr. Lucia Gaskins.   I have personally spent 40 minutes involved in face-to-face and non-face-to-face activities for this patient on the day of the visit. Professional time spent includes the following activities: Preparing to see the patient (review of tests), Obtaining and/or reviewing separately obtained history (admission/discharge record), Performing a medically appropriate examination and/or evaluation , Ordering medications/tests/procedures, referring and communicating with other health care professionals, Documenting clinical information in the EMR, Independently interpreting results (not separately reported), Communicating results to the patient/family/caregiver, Counseling and educating the patient/family/caregiver and Care coordination (not separately reported).

## 2021-12-27 ENCOUNTER — Other Ambulatory Visit: Payer: Self-pay | Admitting: Orthopaedic Surgery

## 2021-12-27 ENCOUNTER — Telehealth: Payer: Self-pay

## 2021-12-27 ENCOUNTER — Telehealth: Payer: Self-pay | Admitting: *Deleted

## 2021-12-27 NOTE — Telephone Encounter (Signed)
Patient called requesting a antibiotic for pain and swelling in her foot before she has amputation. I advised patient per Dr.Comer last note that antibiotics wouldn't help with pain and swelling in her foot and that surgery is recommended to cure the infection. I advised patient to call her surgeon for recommendations regarding pain in the foot.   [place], CMA

## 2021-12-27 NOTE — Telephone Encounter (Signed)
Patient is calling for a prescription for antibiotics,ID doctor is out of town. Suggested that she contact Dr [name] office first to see if he will prescribe first since she just left his office today and is scheduled for amputation. She will call back shortly.

## 2021-12-30 NOTE — Telephone Encounter (Signed)
Yes, finally got it resolved, said thank you

## 2022-01-01 ENCOUNTER — Telehealth: Payer: Self-pay

## 2022-01-01 NOTE — Telephone Encounter (Signed)
   Pre-operative Risk Assessment    Patient Name: Tammy Good  DOB: Feb 27, 1951 MRN: [id number]      Request for Surgical Clearance    Procedure:   Left below the knee amputation, wound vac placement  Date of Surgery:  Clearance 01/14/22                                 Surgeon:  Ida Rogue, MD Surgeon's Group or Practice Name:  [facility] Phone number:  [phone] Fax number:  [phone]   Type of Clearance Requested:   - Medical  - Pharmacy:  Hold Apixaban (Eliquis) Please advise on when they should stop prior to surgery if needed.   Type of Anesthesia:  General    Additional requests/questions:  Please fax a copy of last office note ,pertinent records to the surgeon's office.fax number:[phone] .Attn: Lattie Corns  Signed, Ellwood Handler   02/58/5277, 3:18 PM

## 2022-01-01 NOTE — Telephone Encounter (Signed)
   Pre-operative Risk Assessment    Patient Name: Tammy Good  DOB: October 01, 1950 MRN: [id number]      Request for Surgical Clearance    Procedure:   LEFT BELOW THE KNEE AMPUTATION, WOUND VAC PLACEMENT  Date of Surgery:  Clearance 01/14/22                                 Surgeon:  Melony Overly, MD Surgeon's Group or Practice Name:  [facility] Phone number:  [phone] Fax number:  [phone]   Type of Clearance Requested:   - Medical    Type of Anesthesia:  General    Additional requests/questions:

## 2022-01-06 NOTE — Telephone Encounter (Signed)
Duplicate clearance. See other clearance fro 10/11

## 2022-01-06 NOTE — Telephone Encounter (Signed)
Patient with diagnosis of afib on Eliquis for anticoagulation.    Procedure: LEFT BELOW THE KNEE AMPUTATION, WOUND VAC PLACEMENT Date of procedure: 01/14/22   CHA2DS2-VASc Score = 6  This indicates a 9.7% annual risk of stroke. The patient's score is based upon: CHF History: 1 HTN History: 1 Diabetes History: 1 Stroke History: 0 Vascular Disease History: 1 Age Score: 1 Gender Score: 1   CrCl 74 mL/min using AdjBW Platelet count 679K  Per office protocol, patient can hold Eliquis for 2-3 days prior to procedure.    **This guidance is not considered finalized until pre-operative APP has relayed final recommendations.**

## 2022-01-06 NOTE — Telephone Encounter (Signed)
   Name: Tammy Good  DOB: 04-07-1950  MRN: [id number]  Primary Cardiologist: Glenetta Hew, MD   Preoperative team, please contact this patient and set up a phone call appointment for further preoperative risk assessment. Please obtain consent and complete medication review. Thank you for your help.  I confirm that guidance regarding antiplatelet and oral anticoagulation therapy has been completed and, if necessary, noted below.    Per office protocol, patient can hold Eliquis for 2-3 days prior to procedure.    Lenna Sciara, NP 01/06/2022, 3:42 PM [place]

## 2022-01-06 NOTE — Telephone Encounter (Signed)
Tried to call pt to set up tele pre op, but no answer

## 2022-01-07 ENCOUNTER — Telehealth: Payer: Self-pay | Admitting: *Deleted

## 2022-01-07 NOTE — Pre-Procedure Instructions (Signed)
Surgical Instructions    Your procedure is scheduled on Tuesday October 24.  Report to [facility] Main Entrance "A" at 5:30 A.M., then check in with the Admitting office.  Call this number if you have problems the morning of surgery:  [phone]  If you have any questions prior to your surgery date call [phone]: Open Monday-Friday 8am-4pm  If you experience any cold or flu symptoms such as cough, fever, chills, shortness of breath, etc. between now and your scheduled surgery, please notify us at the above number     Remember:  Do not eat after midnight the night before your surgery  You may drink clear liquids until 4:30 the morning of your surgery.   Clear liquids allowed are: Water, Non-Citrus Juices (without pulp), Carbonated Beverages, Clear Tea, Black Coffee ONLY (NO MILK, CREAM OR POWDERED CREAMER of any kind), and Gatorade    Take these medicines the morning of surgery with A SIP OF WATER:  dexamethasone (DECADRON) levothyroxine (SYNTHROID) linezolid (ZYVOX) midodrine (PROAMATINE)  pantoprazole (PROTONIX)    IF NEEDED: acetaminophen (TYLENOL) metoCLOPramide (REGLAN)  nitroGLYCERIN (NITROSTAT)- Call 911, then [phone] if you take this. Propylene Glycol (SYSTANE COMPLETE) traMADol (ULTRAM)   STOP TAKING apixaban (ELIQUIS) 3 days prior to surgery. Last dose Friday 10/20.  STOP TAKING dapagliflozin propanediol (FARXIGA)  3 days prior to surgery. Last dose Friday 10/20.  As of today, STOP taking any Aspirin (unless otherwise instructed by your surgeon) Aleve, Naproxen, Ibuprofen, Motrin, Advil, Goody's, BC's, all herbal medications, fish oil, and all vitamins.   WHAT DO I DO ABOUT MY DIABETES MEDICATION?  THE NIGHT BEFORE SURGERY, take 50% of your usual dose (21 units) of Insulin Glargine (BASAGLAR KWIKPEN [place])      THE MORNING OF SURGERY, take 50% of your usual dose (21 units) of Insulin Glargine (BASAGLAR KWIKPEN [place])   If your CBG is greater than 220  mg/dL, you may take  of your sliding scale (correction) dose of insulin aspart (NOVOLOG FLEXPEN)   HOW TO MANAGE YOUR DIABETES BEFORE AND AFTER SURGERY  Why is it important to control my blood sugar before and after surgery? Improving blood sugar levels before and after surgery helps healing and can limit problems. A way of improving blood sugar control is eating a healthy diet by:  Eating less sugar and carbohydrates  Increasing activity/exercise  Talking with your doctor about reaching your blood sugar goals High blood sugars (greater than 180 mg/dL) can raise your risk of infections and slow your recovery, so you will need to focus on controlling your diabetes during the weeks before surgery. Make sure that the doctor who takes care of your diabetes knows about your planned surgery including the date and location.  How do I manage my blood sugar before surgery? Check your blood sugar at least 4 times a day, starting 2 days before surgery, to make sure that the level is not too high or low.  Check your blood sugar the morning of your surgery when you wake up and every 2 hours until you get to the Short Stay unit.  If your blood sugar is less than 70 mg/dL, you will need to treat for low blood sugar: Do not take insulin. Treat a low blood sugar (less than 70 mg/dL) with  cup of clear juice (cranberry or apple), 4 glucose tablets, OR glucose gel. Recheck blood sugar in 15 minutes after treatment (to make sure it is greater than 70 mg/dL). If your blood sugar is not greater  than 70 mg/dL on recheck, call [phone] for further instructions. Report your blood sugar to the short stay nurse when you get to Short Stay.  If you are admitted to the hospital after surgery: Your blood sugar will be checked by the staff and you will probably be given insulin after surgery (instead of oral diabetes medicines) to make sure you have good blood sugar levels. The goal for blood sugar control after  surgery is 80-180 mg/dL.          Do NOT Smoke (Tobacco/Vaping)  24 hours prior to your procedure  If you use a CPAP at night, you may bring your mask for your overnight stay.   Contacts, glasses, hearing aids, dentures or partials may not be worn into surgery, please bring cases for these belongings   For patients admitted to the hospital, discharge time will be determined by your treatment team.   Patients discharged the day of surgery will not be allowed to drive home, and someone needs to stay with them for 24 hours.   SURGICAL WAITING ROOM VISITATION Patients having surgery or a procedure may have no more than 2 support people in the waiting area - these visitors may rotate.   Children under the age of 74 must have an adult with them who is not the patient. If the patient needs to stay at the hospital during part of their recovery, the visitor guidelines for inpatient rooms apply. Pre-op nurse will coordinate an appropriate time for 1 support person to accompany patient in pre-op.  This support person may not rotate.   Please refer to the [facility] website for the visitor guidelines for Inpatients (after your surgery is over and you are in a regular room).    Special instructions:    Oral Hygiene is also important to reduce your risk of infection.  Remember - BRUSH YOUR TEETH THE MORNING OF SURGERY WITH YOUR REGULAR TOOTHPASTE   [facility]- Preparing For Surgery  Before surgery, you can play an important role. Because skin is not sterile, your skin needs to be as free of germs as possible. You can reduce the number of germs on your skin by washing with CHG (chlorahexidine gluconate) Soap before surgery.  CHG is an antiseptic cleaner which kills germs and bonds with the skin to continue killing germs even after washing.     Please do not use if you have an allergy to CHG or antibacterial soaps. If your skin becomes reddened/irritated stop using the CHG.  Do not shave  (including legs and underarms) for at least 48 hours prior to first CHG shower. It is OK to shave your face.  Please follow these instructions carefully.     Shower the NIGHT BEFORE SURGERY and the MORNING OF SURGERY with CHG Soap.   If you chose to wash your hair, wash your hair first as usual with your normal shampoo. After you shampoo, rinse your hair and body thoroughly to remove the shampoo.  Then [employer] and genitals (private parts) with your normal soap and rinse thoroughly to remove soap.  After that Use CHG Soap as you would any other liquid soap. You can apply CHG directly to the skin and wash gently with a scrungie or a clean washcloth.   Apply the CHG Soap to your body ONLY FROM THE NECK DOWN.  Do not use on open wounds or open sores. Avoid contact with your eyes, ears, mouth and genitals (private parts). Wash Face and genitals (private parts)  with your normal soap.   Wash thoroughly, paying special attention to the area where your surgery will be performed.  Thoroughly rinse your body with warm water from the neck down.  DO NOT shower/wash with your normal soap after using and rinsing off the CHG Soap.  Pat yourself dry with a CLEAN TOWEL.  Wear CLEAN PAJAMAS to bed the night before surgery  Place CLEAN SHEETS on your bed the night before your surgery  DO NOT SLEEP WITH PETS.   Day of Surgery:  Take a shower with CHG soap. Wear Clean/Comfortable clothing the morning of surgery Remember to brush your teeth WITH YOUR REGULAR TOOTHPASTE. Do not wear jewelry or makeup. Do not wear lotions, powders, perfumes or deodorant. Do not shave 48 hours prior to surgery. Do not bring valuables to the hospital.  [facility] is not responsible for any belongings or valuables.  Do not wear nail polish, gel polish, artificial nails, or any other type of covering on natural nails (fingers and toes) If you have artificial nails or gel coating that need to be removed by a nail salon,  please have this removed prior to surgery. Artificial nails or gel coating may interfere with anesthesia's ability to adequately monitor your vital signs.    If you received a COVID test during your pre-op visit, it is requested that you wear a mask when out in public, stay away from anyone that may not be feeling well, and notify your surgeon if you develop symptoms. If you have been in contact with anyone that has tested positive in the last 10 days, please notify your surgeon.    Please read over the following fact sheets that you were given.

## 2022-01-07 NOTE — Telephone Encounter (Signed)
Patient is returning call.  °

## 2022-01-07 NOTE — Telephone Encounter (Signed)
Pt agreeable to plan of care for tele pre op appt 10//19/23 @ 10 am. Med rec and consent are done.      Patient Consent for Virtual Visit        Tammy Good has provided verbal consent on 01/07/2022 for a virtual visit (video or telephone).   CONSENT FOR VIRTUAL VISIT FOR:  Tammy Good  By participating in this virtual visit I agree to the following:  I hereby voluntarily request, consent and authorize [facility] and its employed or contracted physicians, physician assistants, nurse practitioners or other licensed health care professionals (the Practitioner), to provide me with telemedicine health care services (the "Services") as deemed necessary by the treating Practitioner. I acknowledge and consent to receive the Services by the Practitioner via telemedicine. I understand that the telemedicine visit will involve communicating with the Practitioner through live audiovisual communication technology and the disclosure of certain medical information by electronic transmission. I acknowledge that I have been given the opportunity to request an in-person assessment or other available alternative prior to the telemedicine visit and am voluntarily participating in the telemedicine visit.  I understand that I have the right to withhold or withdraw my consent to the use of telemedicine in the course of my care at any time, without affecting my right to future care or treatment, and that the Practitioner or I may terminate the telemedicine visit at any time. I understand that I have the right to inspect all information obtained and/or recorded in the course of the telemedicine visit and may receive copies of available information for a reasonable fee.  I understand that some of the potential risks of receiving the Services via telemedicine include:  Delay or interruption in medical evaluation due to technological equipment failure or disruption; Information transmitted may not be  sufficient (e.g. poor resolution of images) to allow for appropriate medical decision making by the Practitioner; and/or  In rare instances, security protocols could fail, causing a breach of personal health information.  Furthermore, I acknowledge that it is my responsibility to provide information about my medical history, conditions and care that is complete and accurate to the best of my ability. I acknowledge that Practitioner's advice, recommendations, and/or decision may be based on factors not within their control, such as incomplete or inaccurate data provided by me or distortions of diagnostic images or specimens that may result from electronic transmissions. I understand that the practice of medicine is not an exact science and that Practitioner makes no warranties or guarantees regarding treatment outcomes. I acknowledge that a copy of this consent can be made available to me via my patient portal ([facility]), or I can request a printed copy by calling the office of [facility].    I understand that my insurance will be billed for this visit.   I have read or had this consent read to me. I understand the contents of this consent, which adequately explains the benefits and risks of the Services being provided via telemedicine.  I have been provided ample opportunity to ask questions regarding this consent and the Services and have had my questions answered to my satisfaction. I give my informed consent for the services to be provided through the use of telemedicine in my medical care

## 2022-01-07 NOTE — Telephone Encounter (Signed)
Pt agreeable to plan of care for tele pre op appt 10//19/23 @ 10 am. Med rec and consent are done.

## 2022-01-07 NOTE — Telephone Encounter (Signed)
Tried the pt again today, but no answer today as well. Was calling pt to set up tele visit for pre op clearance. Pt has appt with Dr. Ellyn Hack, however that appt is after her surgery date. She can keep her appt with Dr. Ellyn Hack but she will need a tele visit for pre op clearance before her surgery date.

## 2022-01-08 ENCOUNTER — Encounter (HOSPITAL_COMMUNITY): Payer: Self-pay

## 2022-01-08 ENCOUNTER — Other Ambulatory Visit: Payer: Self-pay

## 2022-01-08 ENCOUNTER — Encounter (HOSPITAL_COMMUNITY)
Admission: RE | Admit: 2022-01-08 | Discharge: 2022-01-08 | Disposition: A | Discharge status: Home / Self Care | Payer: Medicare Other | Source: Ambulatory Visit | Attending: Orthopaedic Surgery | Admitting: Orthopaedic Surgery

## 2022-01-08 VITALS — BP 121/56 | HR 100 | Temp 97.9°F | Resp 17 | Ht 67.0 in | Wt 190.4 lb

## 2022-01-08 DIAGNOSIS — I10 Essential (primary) hypertension: Secondary | ICD-10-CM | POA: Insufficient documentation

## 2022-01-08 DIAGNOSIS — E669 Obesity, unspecified: Secondary | ICD-10-CM

## 2022-01-08 DIAGNOSIS — Z01812 Encounter for preprocedural laboratory examination: Secondary | ICD-10-CM | POA: Diagnosis present

## 2022-01-08 DIAGNOSIS — E1100 Type 2 diabetes mellitus with hyperosmolarity without nonketotic hyperglycemic-hyperosmolar coma (NKHHC): Secondary | ICD-10-CM | POA: Diagnosis not present

## 2022-01-08 DIAGNOSIS — Z01818 Encounter for other preprocedural examination: Secondary | ICD-10-CM

## 2022-01-08 HISTORY — DX: Heart failure, unspecified: I50.9

## 2022-01-08 LAB — CBC
HCT: 25 % — ABNORMAL LOW (ref 36.0–46.0)
Hemoglobin: 7 g/dL — ABNORMAL LOW (ref 12.0–15.0)
MCH: 25.5 pg — ABNORMAL LOW (ref 26.0–34.0)
MCHC: 28 g/dL — ABNORMAL LOW (ref 30.0–36.0)
MCV: 91.2 fL (ref 80.0–100.0)
Platelets: 495 10*3/uL — ABNORMAL HIGH (ref 150–400)
RBC: 2.74 MIL/uL — ABNORMAL LOW (ref 3.87–5.11)
RDW: 21.9 % — ABNORMAL HIGH (ref 11.5–15.5)
WBC: 14.8 10*3/uL — ABNORMAL HIGH (ref 4.0–10.5)
nRBC: 0.7 % — ABNORMAL HIGH (ref 0.0–0.2)

## 2022-01-08 LAB — GLUCOSE, CAPILLARY: Glucose-Capillary: 261 mg/dL — ABNORMAL HIGH (ref 70–99)

## 2022-01-08 LAB — BASIC METABOLIC PANEL
Anion gap: 11 (ref 5–15)
BUN: 8 mg/dL (ref 8–23)
CO2: 20 mmol/L — ABNORMAL LOW (ref 22–32)
Calcium: 8.4 mg/dL — ABNORMAL LOW (ref 8.9–10.3)
Chloride: 103 mmol/L (ref 98–111)
Creatinine, Ser: 0.99 mg/dL (ref 0.44–1.00)
GFR, Estimated: >60 mL/min (ref >60)
Glucose, Bld: 324 mg/dL — ABNORMAL HIGH (ref 70–99)
Potassium: 3.8 mmol/L (ref 3.5–5.1)
Sodium: 134 mmol/L — ABNORMAL LOW (ref 135–145)

## 2022-01-08 LAB — SURGICAL PCR SCREEN
MRSA, PCR: POSITIVE — AB
Staphylococcus aureus: POSITIVE — AB

## 2022-01-08 LAB — HEMOGLOBIN A1C
Hgb A1c MFr Bld: 5.4 % (ref 4.8–5.6)
Mean Plasma Glucose: 108.28 mg/dL

## 2022-01-08 NOTE — Progress Notes (Signed)
PCP - [name] MD Cardiologist - Ellyn Hack  PPM/ICD - denies   Chest x-ray - 07/07/21 EKG - 07/08/21 Stress Test - denies ECHO - 04/24/21 Cardiac Cath - 11/19/20  Sleep Study - denies  Fasting Blood Sugar - 90-120 Checks Blood Sugar constantly- FreeStyle Libre   STOP TAKING dapagliflozin propanediol (FARXIGA)  3 days prior to surgery. Last dose Friday 10/20.  STOP TAKING apixaban (ELIQUIS) 3 days prior to surgery. Last dose Friday 10/20.   As of today, STOP taking any Aspirin (unless otherwise instructed by your surgeon) Aleve, Naproxen, Ibuprofen, Motrin, Advil, Goody's, BC's, all herbal medications, fish oil, and all vitamins.  ERAS Protcol - yes PRE-SURGERY Ensure or G2- no  COVID TEST- no   Anesthesia review: yes  Patient denies shortness of breath, fever, cough and chest pain at PAT appointment   All instructions explained to the patient, with a verbal understanding of the material. Patient agrees to go over the instructions while at home for a better understanding. Patient also instructed to self quarantine after being tested for COVID-19. The opportunity to ask questions was provided.

## 2022-01-08 NOTE — Progress Notes (Signed)
Lab called +MRSA PCR. Betadine DOS per protocol.

## 2022-01-09 ENCOUNTER — Other Ambulatory Visit: Payer: Self-pay | Admitting: Orthopaedic Surgery

## 2022-01-09 ENCOUNTER — Telehealth: Payer: Self-pay | Admitting: Cardiology

## 2022-01-09 ENCOUNTER — Ambulatory Visit: Payer: Medicare Other | Attending: Cardiology | Admitting: General Practice

## 2022-01-09 DIAGNOSIS — Z01810 Encounter for preprocedural cardiovascular examination: Secondary | ICD-10-CM

## 2022-01-09 NOTE — Telephone Encounter (Signed)
[facility] is calling back stating they were unaware the patient was also on Plavix. Due to this they are requesting the parameters for this medication in addition to the clearance received today if possible. Please advise.

## 2022-01-09 NOTE — Anesthesia Preprocedure Evaluation (Addendum)
Anesthesia Evaluation  Patient identified by MRN, date of birth, ID band Patient awake    Reviewed: Allergy & Precautions, NPO status , Patient's Chart, lab work & pertinent test results  Airway Mallampati: II  TM Distance: >3 FB Neck ROM: Full    Dental  (+) Teeth Intact, Dental Advisory Given   Pulmonary former smoker,    breath sounds clear to auscultation       Cardiovascular hypertension, + CAD, + Past MI, + Cardiac Stents, + Peripheral Vascular Disease and +CHF   Rhythm:Regular Rate:Normal     Neuro/Psych negative neurological ROS  negative psych ROS   GI/Hepatic Neg liver ROS, PUD,   Endo/Other  diabetes, Type 2, Insulin DependentHypothyroidism   Renal/GU Renal disease     Musculoskeletal   Abdominal Normal abdominal exam  (+)   Peds  Hematology   Anesthesia Other Findings   Reproductive/Obstetrics                            Anesthesia Physical Anesthesia Plan  ASA: 3  Anesthesia Plan: General   Post-op Pain Management: Regional block*   Induction: Intravenous  PONV Risk Score and Plan: 4 or greater and Ondansetron, Midazolam and Dexamethasone  Airway Management Planned: LMA  Additional Equipment: None  Intra-op Plan:   Post-operative Plan: Extubation in OR  Informed Consent: I have reviewed the patients History and Physical, chart, labs and discussed the procedure including the risks, benefits and alternatives for the proposed anesthesia with the patient or authorized representative who has indicated his/her understanding and acceptance.     Dental advisory given  Plan Discussed with: CRNA  Anesthesia Plan Comments: (PAT note by [name], PA-C: Follows with cardiology for history of CAD with PCI to the RCA in 2015 and PCI to LCx in 2022, PAD with critical limb ischemia and right SFA stenting in August 2022 and left SFA stenting and left tibial angioplasty in  April 2023, HFrEF (EF 40 to 45% by echo 04/2021), and atrial fibrillation.  Last seen by [name], NP via televisit 01/09/2022 for preop evaluation.  Per note, "Chart reviewed as part of pre-operative protocol coverage. Given past medical history and time since last visit, based on ACC/AHA guidelines,Tammy S Foremanwould be at acceptable risk for the planned procedure without further cardiovascular testing. Patient was advised that if she develops new symptoms prior to surgery to contact our office to arrange a follow-up appointment.She verbalized understanding. I will route this recommendation to the requesting party via Epic fax function and remove from pre-op pool. Her RCRI is a class III risk, 6.6% risk of major cardiac event. She is able to complete greater than 4 METS of physical activity."  She was cleared to hold Eliquis 2 to 3 days prior to procedure and Plavix 5 days.  IDDM 2.  Preop labs reviewed, significant anemia with hemoglobin 7.0, hyperglycemia glucose 324 (however A1c only 5.4).  Results were called to Dr. [name] office.  EKG 07/07/2021: Sinus tachycardia with Fusion complexes.  Rate 124. Inferior-posterior infarct , age undetermined Anterolateral infarct , age undetermined  TEE 04/26/2021: 1. Left ventricular ejection fraction, by estimation, is 40 to 45%. The  left ventricle has mildly decreased function. The left ventricle  demonstrates global hypokinesis.  2. Right ventricular systolic function is normal. The right ventricular  size is normal.  3. Left atrial size was mildly dilated. No left atrial/left atrial  appendage thrombus was detected.  4. The mitral  valve is normal in structure. Trivial mitral valve  regurgitation. No evidence of mitral stenosis.  5. The aortic valve is tricuspid. Aortic valve regurgitation is not  visualized. No aortic stenosis is present.  6. There is mild (Grade II) plaque involving the descending aorta.    Conclusion(s)/Recommendation(s): No evidence of vegetation/infective  endocarditis on this transesophageael echocardiogram.   TTE 04/24/2021: 1. Left ventricular ejection fraction, by estimation, is 40 to 45%. The  left ventricle has mildly decreased function. There is mild concentric  left ventricular hypertrophy. Left ventricular diastolic parameters are  indeterminate.  2. The mitral valve was not well visualized. No evidence of mitral valve  regurgitation.  3. The aortic valve is tricuspid.   Conclusion(s)/Recommendation(s): Cannot exclude vegetation due to poor  visibility of the valves. Consider transesophageal echocardiogram, if  clinically indicated.  Cath and PCI 11/19/2020: 1. Severe two-vessel coronary artery disease including 95% proximal LCx stenosis and multifocal D1 disease of up to 90%. 2. Mild to moderate LAD and RCA disease. 3. Patent RCA stent. 4. Mildly elevated left ventricular filling pressure (LVEDP ~20 mmHg). 5. Challenging but successful PCI to proximal LCx using Onyx Frontier 2.5 x 12 mm DES with 0% residual stenosis and TIMI-3 flow.  Recommendations: 1. Restart IV heparin 2 hours after TR band removal. 2. If no evidence of bleeding or vascular injury, consider transitioning to clopidogrel + DOAC as soon as tomorrow at the discretion of the rounding cardiology team given h/o PAF this admission. 3. Aggressive secondary prevention. 4. Medical management of diagonal disease, as the diseased portions of the vessel are too small for PCI.  )       Anesthesia Quick Evaluation

## 2022-01-09 NOTE — Telephone Encounter (Signed)
Faxed over anitcoagulation recommendations to [facility].

## 2022-01-09 NOTE — Telephone Encounter (Signed)
     Primary Cardiologist: Glenetta Hew, MD  Chart reviewed as part of pre-operative protocol coverage. Given past medical history and time since last visit, based on ACC/AHA guidelines, Tammy Good would be at acceptable risk for the planned procedure without further cardiovascular testing.   Patient was advised that if she develops new symptoms prior to surgery to contact our office to arrange a follow-up appointment.  She verbalized understanding.  I will route this recommendation to the requesting party via Epic fax function and remove from pre-op pool.  Her clopidogrel may also be held for 5 days prior to her procedure.  Please resume as soon as hemostasis is achieved.  Please call with questions.  Jossie Ng. Vestal Crandall NP-C     01/09/2022, 12:35 PM [name] Office [phone] Fax [phone]

## 2022-01-09 NOTE — Progress Notes (Signed)
Virtual Visit via Telephone Note   Because of Tammy Good's co-morbid illnesses, she is at least at moderate risk for complications without adequate follow up.  This format is felt to be most appropriate for this patient at this time.  The patient did not have access to video technology/had technical difficulties with video requiring transitioning to audio format only (telephone).  All issues noted in this document were discussed and addressed.  No physical exam could be performed with this format.  Please refer to the patient's chart for her consent to telehealth for [facility].  Evaluation Performed:  Preoperative cardiovascular risk assessment _____________   Date:  01/09/2022   Patient ID:  Tammy Good, DOB 26-Mar-1950, MRN [id number] Patient Location:  Home Provider location:   Office  Primary Care Provider:  [name], MD Primary Cardiologist:  Glenetta Hew, MD  Chief Complaint / Patient Profile   71 y.o. y/o female with a h/o CAD, PAD, hypothyroidism, hyperlipidemia, HTN who is pending left below-knee amputation and presents today for telephonic preoperative cardiovascular risk assessment.  Past Medical History    Past Medical History:  Diagnosis Date   CAD S/P percutaneous coronary angioplasty 08/2013   100% mRCA - PCI Integrity Resolute DES 3.0 mm x 38 mm - 3.35 mm; PTCA of RPA V 2.0 mm x 15 mm   CHF (congestive heart failure) (HCC)    Cholesteatoma    right   Diabetes mellitus type 2 in obese (HCC)    On insulin and Invokana   History of osteomyelitis L 5th Toe all 05/2019   s/p Partial Ray Amputation with partial closure; 6 wks Abx & LSFA Atherectomy/DEB PTA with Stent for focal dissection.   Hyperlipidemia with target LDL less than 70    Hypothyroidism (acquired)    Mild essential hypertension    Obesity (BMI 30-39.9) 11/17/2013   PAD (peripheral artery disease) ([place]) 05/26/2019   05/26/19: Abd AoGram- BLE runoff -> L SFA orbital  atherectomy - PTA w/ DEB & Stent 6 x 40 Luttonix (for focal dissection) - patent Pop A with 3 V runoff. LEA Dopplers 01/03/2020: RABI (prev) 0.68 (0.53)/ RTBI (prev) 0.58 (0.33); LABI (prev) 0.80 (0.64), LTBI (prev) 0.64 (0.51); R mSFA ~50-74%, L mSFA 50-74%. Patent Prox SFA stent < 49% stenosis   Panhypopituitarism (HCC)    ST elevation myocardial infarction (STEMI) of inferior wall, subsequent episode of care ([place]) 08/2013   80% branch of D1, 40% mid AV groove circumflex, 100% RCA with subacute thrombus -- thrombus extending into RPA V with 100% occlusion after initial angioplasty of mid RCA ;; Post MI ECHO 6/9/'15: EF 50-55%, mild LVH with moderate HK of inferior wall, Gr1 DD, mild LA dilation; mildly reduced RV function   Past Surgical History:  Procedure Laterality Date   ABDOMINAL AORTOGRAM W/LOWER EXTREMITY N/A 05/26/2019   Procedure: ABDOMINAL AORTOGRAM W/LOWER EXTREMITY;  Surgeon: Marty Heck, MD;  Location: [place] [facility];  Service: Cardiovascular;  Laterality: N/A;   ABDOMINAL AORTOGRAM W/LOWER EXTREMITY N/A 11/15/2020   Procedure: ABDOMINAL AORTOGRAM W/LOWER EXTREMITY;  Surgeon: Marty Heck, MD;  Location: [place] [facility];  Service: Cardiovascular;  Laterality: N/A;   ABDOMINAL AORTOGRAM W/LOWER EXTREMITY N/A 04/18/2021   Procedure: ABDOMINAL AORTOGRAM W/LOWER EXTREMITY;  Surgeon: Marty Heck, MD;  Location: [place] [facility];  Service: Cardiovascular;  Laterality: N/A;   ABDOMINAL AORTOGRAM W/LOWER EXTREMITY Left 07/11/2021   Procedure: ABDOMINAL AORTOGRAM W/LOWER EXTREMITY;  Surgeon: Marty Heck, MD;  Location: [place] [facility];  Service: Cardiovascular;  Laterality: Left;   AMPUTATION Left 05/25/2019   Procedure: AMPUTATION RAY 5th;  Surgeon: Trula Slade, DPM;  Location: [place];  Service: Podiatry;  Laterality: Left;   BONE BIOPSY Left 04/24/2021   Procedure: BONE BIOPSY;  Surgeon: Landis Martins, DPM;  Location: [place];  Service:  Podiatry;  Laterality: Left;  left ankle/fibula   BONE BIOPSY Left 07/15/2021   Procedure: LEFT FOOT BONE BIOPSY;  Surgeon: Trula Slade, DPM;  Location: [place];  Service: Podiatry;  Laterality: Left;   Cardiac Event Monitor  July-August 2015   Sinus rhythm with PVCs   CHOLECYSTECTOMY     COLONOSCOPY N/A 08/31/2013   Procedure: COLONOSCOPY;  Surgeon: Juanita Craver, MD;  Location: [facility] ENDOSCOPY;  Service: Endoscopy;  Laterality: N/A;   CORONARY STENT INTERVENTION N/A 11/19/2020   Procedure: CORONARY STENT INTERVENTION;  Surgeon: Nelva Bush, MD;  Location: [place] [facility];  Service: Cardiovascular;  Laterality: N/A;   ESOPHAGOGASTRODUODENOSCOPY N/A 09/01/2013   Procedure: ESOPHAGOGASTRODUODENOSCOPY (EGD);  Surgeon: Beryle Beams, MD;  Location: [facility] ENDOSCOPY;  Service: Endoscopy;  Laterality: N/A;  bedside   ESOPHAGOGASTRODUODENOSCOPY (EGD) WITH PROPOFOL N/A 05/26/2021   Procedure: ESOPHAGOGASTRODUODENOSCOPY (EGD) WITH PROPOFOL;  Surgeon: Carol Ada, MD;  Location: [place];  Service: Gastroenterology;  Laterality: N/A;   EYE SURGERY Bilateral    bilateral cataracts   INCISION AND DRAINAGE Left 04/24/2021   Procedure: INCISION AND DRAINAGE;  Surgeon: Landis Martins, DPM;  Location: [place];  Service: Podiatry;  Laterality: Left;   IRRIGATION AND DEBRIDEMENT FOOT Left 07/15/2021   Procedure: IRRIGATION AND DEBRIDEMENT FOOT;  Surgeon: Trula Slade, DPM;  Location: [place];  Service: Podiatry;  Laterality: Left;   LEFT HEART CATH AND CORONARY ANGIOGRAPHY N/A 11/19/2020   Procedure: LEFT HEART CATH AND CORONARY ANGIOGRAPHY;  Surgeon: Nelva Bush, MD;  Location: [place] [facility];  Service: Cardiovascular;  Laterality: N/A;   LEFT HEART CATHETERIZATION WITH CORONARY ANGIOGRAM N/A 08/30/2013   Procedure: LEFT HEART CATHETERIZATION WITH CORONARY ANGIOGRAM;  Surgeon: Leonie Man, MD;  Location[facility] CATH LAB: 100% mRCA (thrombus - extends to RPAV), 80% D1, 40% AVG Cx.    PERCUTANEOUS CORONARY STENT INTERVENTION (PCI-S)  08/30/2013   Procedure: PERCUTANEOUS CORONARY STENT INTERVENTION (PCI-S);  Surgeon: Leonie Man, MD;  Location: [facility] CATH LAB;  Integrity Resolute DES 2.0 mm x 38 mm -- 3.35 mm.; PTCA of proximal RPA V. - 3.0 mm x 15 mm balloon   PERIPHERAL VASCULAR ATHERECTOMY  05/26/2019   Procedure: PERIPHERAL VASCULAR ATHERECTOMY;  Surgeon: Marty Heck, MD;  Location: [place] [facility];  Service: Cardiovascular;;  Left SFA   PERIPHERAL VASCULAR BALLOON ANGIOPLASTY Left 07/11/2021   Procedure: PERIPHERAL VASCULAR BALLOON ANGIOPLASTY;  Surgeon: Marty Heck, MD;  Location: [place] [facility];  Service: Cardiovascular;  Laterality: Left;  PT TRUNK / AT   PERIPHERAL VASCULAR INTERVENTION  05/26/2019   Procedure: PERIPHERAL VASCULAR INTERVENTION;  Surgeon: Marty Heck, MD;  Location: [place] [facility];  Service: Cardiovascular;;  Left SFA   PERIPHERAL VASCULAR INTERVENTION Right 11/15/2020   Procedure: PERIPHERAL VASCULAR INTERVENTION;  Surgeon: Marty Heck, MD;  Location: [place] [facility];  Service: Cardiovascular;  Laterality: Right;  Superficial Femoral Artery   PERIPHERAL VASCULAR INTERVENTION Left 07/11/2021   Procedure: PERIPHERAL VASCULAR INTERVENTION;  Surgeon: Marty Heck, MD;  Location: [place] [facility];  Service: Cardiovascular;  Laterality: Left;  SFA   PITUITARY SURGERY  TEE WITHOUT CARDIOVERSION N/A 04/26/2021   Procedure: TRANSESOPHAGEAL ECHOCARDIOGRAM (TEE);  Surgeon: Buford Dresser, MD;  Location: [facility] ENDOSCOPY;  Service: Cardiovascular;  Laterality: N/A;   TRANSTHORACIC ECHOCARDIOGRAM  08/30/2013   mild LVH. EF 50-55%. Moderate HK of the entire inferior myocardium. GR 1 DD. Mild LA dilation. Mildly reduced RV function   TYMPANOMASTOIDECTOMY Right 12/28/2017   Procedure: RIGHT TYMPANOMASTOIDECTOMY;  Surgeon: Leta Baptist, MD;  Location: [place];  Service: ENT;  Laterality:  Right;    Allergies  Allergies  Allergen Reactions   Strawberry Extract Itching, Swelling and Anaphylaxis    Mouth swells and gets itchy    History of Present Illness    Tammy Good is a 71 y.o. female who presents via audio/video conferencing for a telehealth visit today.  Pt was last seen in cardiology clinic on 01/25/2021 by Bunnie Domino, DNP.  At that time Tammy Good was doing well .  The patient is now pending procedure as outlined above. Since her last visit, she remained stable from a cardiac standpoint.  To she denies chest pain, shortness of breath, lower extremity edema, fatigue, palpitations, melena, hematuria, hemoptysis, diaphoresis, weakness, presyncope, syncope, orthopnea, and PND.    Home Medications    Prior to Admission medications   Medication Sig Start Date End Date Taking? Authorizing Provider  acetaminophen (TYLENOL) 500 MG tablet Take 500-1,000 mg by mouth 3 (three) times daily as needed for headache (pain).    [provider]  apixaban (ELIQUIS) 5 MG TABS tablet Take 1 tablet (5 mg total) by mouth 2 (two) times daily. 08/06/21   Leonie Man, MD  atorvastatin (LIPITOR) 40 MG tablet Take 1 tablet (40 mg total) by mouth daily at 6 PM. 09/03/13   Thereasa Solo, Kimberlee Nearing, MD  B-D UF III MINI PEN NEEDLES 31G X 5 MM MISC Inject into the skin. 05/28/20   [provider]  clopidogrel (PLAVIX) 75 MG tablet TAKE 1 TABLET BY MOUTH DAILY 11/20/21   Leonie Man, MD  Continuous Blood Gluc Sensor (FREESTYLE LIBRE 2 SENSOR) MISC Apply topically every 14 (fourteen) days. 06/12/20   [provider]  dapagliflozin propanediol (FARXIGA) 10 MG TABS tablet Take 10 mg by mouth every morning.    [provider]  dexamethasone (DECADRON) 0.5 MG tablet Take 2 tablets (1 mg total) by mouth daily. Patient taking differently: Take 0.5 mg by mouth daily. 07/17/21 07/17/22  Masters, Katie, DO  ferrous sulfate 325 (65 FE) MG tablet Take 325 mg  by mouth at bedtime.    [provider]  furosemide (LASIX) 20 MG tablet Take 1 tablet (20 mg total) by mouth daily as needed for fluid or edema. 06/03/21 06/03/22  Rai, Ripudeep K, MD  insulin aspart (NOVOLOG FLEXPEN) 100 UNIT/ML FlexPen Inject 0-7 Units into the skin 3 (three) times daily with meals. Sliding scale insulin    [provider]  Insulin Glargine (BASAGLAR KWIKPEN [place]) Inject 42 Units into the skin at bedtime.    [provider]  levothyroxine (SYNTHROID) 125 MCG tablet Take 1 tablet (125 mcg total) by mouth daily before breakfast. 11/21/20   Baglia, Corrina, PA-C  linezolid (ZYVOX) 600 MG tablet Take 1 tablet (600 mg total) by mouth 2 (two) times daily. 11/22/21   Lorenda Peck, DPM  metoCLOPramide (REGLAN) 5 MG tablet Take 1 tablet (5 mg total) by mouth 3 (three) times daily as needed for nausea or vomiting. Take before meals as needed, if having nausea 06/03/21 06/03/22  Rai, Ripudeep K, MD  midodrine (PROAMATINE) 5 MG tablet Take 5 mg by mouth in the morning and at bedtime.    [provider]  nitroGLYCERIN (NITROSTAT) 0.4 MG SL tablet Place 0.4 mg under the tongue every 5 (five) minutes x 3 doses as needed for chest pain. 08/23/21   [provider]  nystatin cream (MYCOSTATIN) Apply 1 application topically 2 (two) times daily. Patient taking differently: Apply 1 application  topically 2 (two) times daily as needed (rash). 08/29/19   Carlyle Basques, MD  pantoprazole (PROTONIX) 40 MG tablet Take 40 mg by mouth daily.    [provider]  polyethylene glycol powder (GLYCOLAX/MIRALAX) 17 GM/SCOOP powder Take 1 capful with water (17 g) by mouth daily. Patient taking differently: Take 17 g by mouth daily as needed (constipation.). 06/04/21   Rai, Vernelle Emerald, MD  Propylene Glycol (SYSTANE COMPLETE) 0.6 % SOLN Place 1 drop into both eyes 2 (two) times daily as needed (dry/irritated eyes.).    [provider]  traMADol (ULTRAM) 50 MG tablet  Take 50 mg by mouth every 6 (six) hours as needed (pain.).    [provider]    Physical Exam    Vital Signs:  Tammy Good does not have vital signs available for review today.  Given telephonic nature of communication, physical exam is limited. AAOx3. NAD. Normal affect.  Speech and respirations are unlabored.  Accessory Clinical Findings    None  Assessment & Plan    1.  Preoperative Cardiovascular Risk Assessment: Left below-knee amputation, 01/14/2022, Dr. Melony Overly  Primary Cardiologist: Glenetta Hew, MD  Chart reviewed as part of pre-operative protocol coverage. Given past medical history and time since last visit, based on ACC/AHA guidelines, Tammy Good would be at acceptable risk for the planned procedure without further cardiovascular testing.   Patient was advised that if she develops new symptoms prior to surgery to contact our office to arrange a follow-up appointment.  She verbalized understanding.  I will route this recommendation to the requesting party via Epic fax function and remove from pre-op pool.   Her RCRI is a class III risk, 6.6% risk of major cardiac event.  She is able to complete greater than 4 METS of physical activity.  Patient with diagnosis of afib on Eliquis for anticoagulation.     Procedure: LEFT BELOW THE KNEE AMPUTATION, WOUND VAC PLACEMENT Date of procedure: 01/14/22     CHA2DS2-VASc Score = 6  This indicates a 9.7% annual risk of stroke. The patient's score is based upon: CHF History: 1 HTN History: 1 Diabetes History: 1 Stroke History: 0 Vascular Disease History: 1 Age Score: 1 Gender Score: 1     CrCl 74 mL/min Platelet count 679K   Per office protocol, patient can hold Eliquis for 2-3 days prior to procedure.     Time:   Today, I have spent 6  minutes with the patient with telehealth technology discussing medical history, symptoms, and management plan.  Prior to her phone evaluation I spent  greater than 10 minutes reviewing her past medical history.   Deberah Pelton, NP  01/09/2022, 7:20 AM

## 2022-01-09 NOTE — Progress Notes (Signed)
Anesthesia Chart Review:  Follows with cardiology for history of CAD with PCI to the RCA in 2015 and PCI to LCx in 2022, PAD with critical limb ischemia and right SFA stenting in August 2022 and left SFA stenting and left tibial angioplasty in April 2023, HFrEF (EF 40 to 45% by echo 04/2021), and atrial fibrillation.  Last seen by [name], NP via televisit 01/09/2022 for preop evaluation.  Per note, "Chart reviewed as part of pre-operative protocol coverage. Given past medical history and time since last visit, based on ACC/AHA guidelines, Tammy Good would be at acceptable risk for the planned procedure without further cardiovascular testing. Patient was advised that if she develops new symptoms prior to surgery to contact our office to arrange a follow-up appointment.  She verbalized understanding. I will route this recommendation to the requesting party via Epic fax function and remove from pre-op pool. Her RCRI is a class III risk, 6.6% risk of major cardiac event.  She is able to complete greater than 4 METS of physical activity."  She was cleared to hold Eliquis 2 to 3 days prior to procedure and Plavix 5 days.  IDDM 2.  Preop labs reviewed, significant anemia with hemoglobin 7.0, hyperglycemia glucose 324 (however A1c only 5.4).  Results were called to Dr. [name] office.  EKG 07/07/2021: Sinus tachycardia with Fusion complexes.  Rate 124. Inferior-posterior infarct , age undetermined Anterolateral infarct , age undetermined  TEE 04/26/2021:  1. Left ventricular ejection fraction, by estimation, is 40 to 45%. The  left ventricle has mildly decreased function. The left ventricle  demonstrates global hypokinesis.   2. Right ventricular systolic function is normal. The right ventricular  size is normal.   3. Left atrial size was mildly dilated. No left atrial/left atrial  appendage thrombus was detected.   4. The mitral valve is normal in structure. Trivial mitral valve  regurgitation.  No evidence of mitral stenosis.   5. The aortic valve is tricuspid. Aortic valve regurgitation is not  visualized. No aortic stenosis is present.   6. There is mild (Grade II) plaque involving the descending aorta.   Conclusion(s)/Recommendation(s): No evidence of vegetation/infective  endocarditis on this transesophageael echocardiogram.   TTE 04/24/2021:  1. Left ventricular ejection fraction, by estimation, is 40 to 45%. The  left ventricle has mildly decreased function. There is mild concentric  left ventricular hypertrophy. Left ventricular diastolic parameters are  indeterminate.   2. The mitral valve was not well visualized. No evidence of mitral valve  regurgitation.   3. The aortic valve is tricuspid.   Conclusion(s)/Recommendation(s): Cannot exclude vegetation due to poor  visibility of the valves. Consider transesophageal echocardiogram, if  clinically indicated.  Cath and PCI 11/19/2020: Severe two-vessel coronary artery disease including 95% proximal LCx stenosis and multifocal D1 disease of up to 90%. Mild to moderate LAD and RCA disease. Patent RCA stent. Mildly elevated left ventricular filling pressure (LVEDP ~20 mmHg). Challenging but successful PCI to proximal LCx using Onyx Frontier 2.5 x 12 mm DES with 0% residual stenosis and TIMI-3 flow.   Recommendations: Restart IV heparin 2 hours after TR band removal. If no evidence of bleeding or vascular injury, consider transitioning to clopidogrel + DOAC as soon as tomorrow at the discretion of the rounding cardiology team given h/o PAF this admission. Aggressive secondary prevention. Medical management of diagonal disease, as the diseased portions of the vessel are too small for PCI.   [name], PA-C [facility]Anesthesiology Phone [phone])  [phone] 01/09/2022 1:46 PM

## 2022-01-13 ENCOUNTER — Encounter (HOSPITAL_COMMUNITY): Payer: Self-pay

## 2022-01-13 ENCOUNTER — Inpatient Hospital Stay (HOSPITAL_COMMUNITY)
Admit: 2022-01-13 | Discharge: 2022-01-17 | DRG: 617 | Disposition: A | Discharge status: Rehabilitation Facility | Payer: Medicare Other | Attending: Internal Medicine | Admitting: Internal Medicine

## 2022-01-13 ENCOUNTER — Ambulatory Visit: Payer: Medicare Other | Admitting: Podiatry

## 2022-01-13 DIAGNOSIS — Z955 Presence of coronary angioplasty implant and graft: Secondary | ICD-10-CM | POA: Diagnosis not present

## 2022-01-13 DIAGNOSIS — K5900 Constipation, unspecified: Secondary | ICD-10-CM | POA: Diagnosis present

## 2022-01-13 DIAGNOSIS — Z79899 Other long term (current) drug therapy: Secondary | ICD-10-CM

## 2022-01-13 DIAGNOSIS — I509 Heart failure, unspecified: Secondary | ICD-10-CM | POA: Diagnosis not present

## 2022-01-13 DIAGNOSIS — E1143 Type 2 diabetes mellitus with diabetic autonomic (poly)neuropathy: Secondary | ICD-10-CM | POA: Diagnosis present

## 2022-01-13 DIAGNOSIS — E785 Hyperlipidemia, unspecified: Secondary | ICD-10-CM | POA: Diagnosis present

## 2022-01-13 DIAGNOSIS — I252 Old myocardial infarction: Secondary | ICD-10-CM

## 2022-01-13 DIAGNOSIS — I4891 Unspecified atrial fibrillation: Secondary | ICD-10-CM | POA: Diagnosis present

## 2022-01-13 DIAGNOSIS — I480 Paroxysmal atrial fibrillation: Secondary | ICD-10-CM | POA: Diagnosis present

## 2022-01-13 DIAGNOSIS — Z794 Long term (current) use of insulin: Secondary | ICD-10-CM | POA: Diagnosis not present

## 2022-01-13 DIAGNOSIS — M25561 Pain in right knee: Secondary | ICD-10-CM | POA: Diagnosis present

## 2022-01-13 DIAGNOSIS — D649 Anemia, unspecified: Secondary | ICD-10-CM | POA: Diagnosis present

## 2022-01-13 DIAGNOSIS — Z7902 Long term (current) use of antithrombotics/antiplatelets: Secondary | ICD-10-CM

## 2022-01-13 DIAGNOSIS — D75839 Thrombocytosis, unspecified: Secondary | ICD-10-CM | POA: Diagnosis not present

## 2022-01-13 DIAGNOSIS — K5903 Drug induced constipation: Secondary | ICD-10-CM | POA: Diagnosis not present

## 2022-01-13 DIAGNOSIS — Z820 Family history of epilepsy and other diseases of the nervous system: Secondary | ICD-10-CM | POA: Diagnosis not present

## 2022-01-13 DIAGNOSIS — I2510 Atherosclerotic heart disease of native coronary artery without angina pectoris: Secondary | ICD-10-CM

## 2022-01-13 DIAGNOSIS — S88112S Complete traumatic amputation at level between knee and ankle, left lower leg, sequela: Secondary | ICD-10-CM | POA: Diagnosis not present

## 2022-01-13 DIAGNOSIS — I110 Hypertensive heart disease with heart failure: Secondary | ICD-10-CM | POA: Diagnosis present

## 2022-01-13 DIAGNOSIS — Z8249 Family history of ischemic heart disease and other diseases of the circulatory system: Secondary | ICD-10-CM | POA: Diagnosis not present

## 2022-01-13 DIAGNOSIS — I5022 Chronic systolic (congestive) heart failure: Secondary | ICD-10-CM | POA: Diagnosis present

## 2022-01-13 DIAGNOSIS — I959 Hypotension, unspecified: Secondary | ICD-10-CM | POA: Diagnosis present

## 2022-01-13 DIAGNOSIS — E861 Hypovolemia: Secondary | ICD-10-CM | POA: Diagnosis not present

## 2022-01-13 DIAGNOSIS — M869 Osteomyelitis, unspecified: Secondary | ICD-10-CM | POA: Diagnosis present

## 2022-01-13 DIAGNOSIS — E1151 Type 2 diabetes mellitus with diabetic peripheral angiopathy without gangrene: Secondary | ICD-10-CM | POA: Diagnosis present

## 2022-01-13 DIAGNOSIS — M009 Pyogenic arthritis, unspecified: Secondary | ICD-10-CM | POA: Diagnosis present

## 2022-01-13 DIAGNOSIS — E11621 Type 2 diabetes mellitus with foot ulcer: Secondary | ICD-10-CM | POA: Diagnosis not present

## 2022-01-13 DIAGNOSIS — E119 Type 2 diabetes mellitus without complications: Secondary | ICD-10-CM

## 2022-01-13 DIAGNOSIS — S88112A Complete traumatic amputation at level between knee and ankle, left lower leg, initial encounter: Secondary | ICD-10-CM | POA: Diagnosis not present

## 2022-01-13 DIAGNOSIS — Z7901 Long term (current) use of anticoagulants: Secondary | ICD-10-CM

## 2022-01-13 DIAGNOSIS — E1169 Type 2 diabetes mellitus with other specified complication: Principal | ICD-10-CM | POA: Diagnosis present

## 2022-01-13 DIAGNOSIS — I739 Peripheral vascular disease, unspecified: Secondary | ICD-10-CM | POA: Diagnosis present

## 2022-01-13 DIAGNOSIS — Z79890 Hormone replacement therapy (postmenopausal): Secondary | ICD-10-CM | POA: Diagnosis not present

## 2022-01-13 DIAGNOSIS — E039 Hypothyroidism, unspecified: Secondary | ICD-10-CM | POA: Diagnosis present

## 2022-01-13 DIAGNOSIS — Z91018 Allergy to other foods: Secondary | ICD-10-CM | POA: Diagnosis not present

## 2022-01-13 DIAGNOSIS — M25562 Pain in left knee: Secondary | ICD-10-CM | POA: Diagnosis present

## 2022-01-13 DIAGNOSIS — Z87891 Personal history of nicotine dependence: Secondary | ICD-10-CM | POA: Diagnosis not present

## 2022-01-13 DIAGNOSIS — I10 Essential (primary) hypertension: Secondary | ICD-10-CM | POA: Diagnosis not present

## 2022-01-13 DIAGNOSIS — D62 Acute posthemorrhagic anemia: Secondary | ICD-10-CM | POA: Diagnosis present

## 2022-01-13 DIAGNOSIS — G546 Phantom limb syndrome with pain: Secondary | ICD-10-CM | POA: Diagnosis not present

## 2022-01-13 DIAGNOSIS — I25810 Atherosclerosis of coronary artery bypass graft(s) without angina pectoris: Secondary | ICD-10-CM | POA: Diagnosis not present

## 2022-01-13 DIAGNOSIS — L97509 Non-pressure chronic ulcer of other part of unspecified foot with unspecified severity: Secondary | ICD-10-CM | POA: Diagnosis not present

## 2022-01-13 DIAGNOSIS — R3915 Urgency of urination: Secondary | ICD-10-CM | POA: Diagnosis not present

## 2022-01-13 DIAGNOSIS — Z4781 Encounter for orthopedic aftercare following surgical amputation: Secondary | ICD-10-CM | POA: Diagnosis present

## 2022-01-13 DIAGNOSIS — I9589 Other hypotension: Secondary | ICD-10-CM | POA: Diagnosis present

## 2022-01-13 DIAGNOSIS — Z89512 Acquired absence of left leg below knee: Secondary | ICD-10-CM | POA: Diagnosis not present

## 2022-01-13 DIAGNOSIS — Z87892 Personal history of anaphylaxis: Secondary | ICD-10-CM | POA: Diagnosis not present

## 2022-01-13 DIAGNOSIS — E11649 Type 2 diabetes mellitus with hypoglycemia without coma: Secondary | ICD-10-CM | POA: Diagnosis present

## 2022-01-13 DIAGNOSIS — M86672 Other chronic osteomyelitis, left ankle and foot: Secondary | ICD-10-CM | POA: Diagnosis present

## 2022-01-13 DIAGNOSIS — Z9861 Coronary angioplasty status: Secondary | ICD-10-CM

## 2022-01-13 DIAGNOSIS — Z807 Family history of other malignant neoplasms of lymphoid, hematopoietic and related tissues: Secondary | ICD-10-CM | POA: Diagnosis not present

## 2022-01-13 DIAGNOSIS — R350 Frequency of micturition: Secondary | ICD-10-CM | POA: Diagnosis present

## 2022-01-13 DIAGNOSIS — D638 Anemia in other chronic diseases classified elsewhere: Secondary | ICD-10-CM | POA: Diagnosis present

## 2022-01-13 DIAGNOSIS — G8929 Other chronic pain: Secondary | ICD-10-CM | POA: Diagnosis not present

## 2022-01-13 LAB — CBC
HCT: 23.7 % — ABNORMAL LOW (ref 36.0–46.0)
Hemoglobin: 6.9 g/dL — CL (ref 12.0–15.0)
MCH: 25.8 pg — ABNORMAL LOW (ref 26.0–34.0)
MCHC: 29.1 g/dL — ABNORMAL LOW (ref 30.0–36.0)
MCV: 88.8 fL (ref 80.0–100.0)
Platelets: 485 10*3/uL — ABNORMAL HIGH (ref 150–400)
RBC: 2.67 MIL/uL — ABNORMAL LOW (ref 3.87–5.11)
RDW: 22.1 % — ABNORMAL HIGH (ref 11.5–15.5)
WBC: 7 10*3/uL (ref 4.0–10.5)
nRBC: 0 % (ref 0.0–0.2)

## 2022-01-13 LAB — BASIC METABOLIC PANEL
Anion gap: 9 (ref 5–15)
BUN: 9 mg/dL (ref 8–23)
CO2: 23 mmol/L (ref 22–32)
Calcium: 8.3 mg/dL — ABNORMAL LOW (ref 8.9–10.3)
Chloride: 107 mmol/L (ref 98–111)
Creatinine, Ser: 0.78 mg/dL (ref 0.44–1.00)
GFR, Estimated: >60 mL/min (ref >60)
Glucose, Bld: 127 mg/dL — ABNORMAL HIGH (ref 70–99)
Potassium: 3.8 mmol/L (ref 3.5–5.1)
Sodium: 139 mmol/L (ref 135–145)

## 2022-01-13 LAB — GLUCOSE, CAPILLARY: Glucose-Capillary: 92 mg/dL (ref 70–99)

## 2022-01-13 LAB — PREPARE RBC (CROSSMATCH)

## 2022-01-13 MED ORDER — SODIUM CHLORIDE 0.9 % IV SOLN
2.0000 g | INTRAVENOUS | Status: DC
  Administered 2022-01-13 – 2022-01-14 (×2): 2 g via INTRAVENOUS
  Filled 2022-01-13 (×2): qty 20

## 2022-01-13 MED ORDER — VANCOMYCIN HCL 1750 MG/350ML IV SOLN
1750.0000 mg | INTRAVENOUS | Status: DC
  Administered 2022-01-14: 1750 mg via INTRAVENOUS
  Filled 2022-01-13 (×2): qty 350

## 2022-01-13 MED ORDER — SODIUM CHLORIDE 0.9% IV SOLUTION: Freq: Once | INTRAVENOUS | Status: AC

## 2022-01-13 MED ORDER — INSULIN ASPART 100 UNIT/ML IJ SOLN
0.0000 [IU] | Freq: Three times a day (TID) | INTRAMUSCULAR | Status: DC
  Administered 2022-01-14: 3 [IU] via SUBCUTANEOUS
  Administered 2022-01-14: 8 [IU] via SUBCUTANEOUS
  Administered 2022-01-15: 5 [IU] via SUBCUTANEOUS
  Administered 2022-01-15: 3 [IU] via SUBCUTANEOUS
  Administered 2022-01-15 (×2): 5 [IU] via SUBCUTANEOUS
  Administered 2022-01-16 – 2022-01-17 (×3): 3 [IU] via SUBCUTANEOUS

## 2022-01-13 MED ORDER — VANCOMYCIN HCL 2000 MG/400ML IV SOLN
2000.0000 mg | Freq: Once | INTRAVENOUS | Status: AC
  Administered 2022-01-13: 2000 mg via INTRAVENOUS
  Filled 2022-01-13: qty 400

## 2022-01-13 NOTE — Progress Notes (Signed)
Date and time results received: 01/13/22 2224 (use smartphrase ".now" to insert current time)  Test: hemoglobin Critical Value: 6.9  Name of Provider Notified: Dr. Flossie Buffy  Orders Received? Or Actions Taken?  awaiting response

## 2022-01-13 NOTE — Assessment & Plan Note (Signed)
Baseline around 9 with Hgb of 6.9 on presentation. -denies any melena, hematochezia  -has hx of colonoscopy 08/2013 with Dr. Collene Mares due to acute anemia and was only noted to have internal hemorrhoids with dark blood product.  -Transfuse 1u pRBC and follow post H/H. Goal of Hgb greater than 8 due to her CAD with stents -Plavix and Eliquis currently on hold in anticipation for surgery

## 2022-01-13 NOTE — Progress Notes (Addendum)
Pharmacy Antibiotic Note  Tammy Good is a 71 y.o. female admitted on 01/13/2022 with  Osteomyelitis .  Pharmacy has been consulted for vancomycin dosing.  Plan: Vancomycin 2000 mg IV x 1 bolus dose, followed by Vancomycin 1750 mg IV x every 24 hours. (estAUC 485, using CrCl 0.8) Monitor clinical progress, cultures/sensitivities, renal function, abx plan Vancomycin levels as indicated Plan for left BKA tomorrow   Temp (24hrs), Avg:98.2 F (36.8 C), Min:98.2 F (36.8 C), Max:98.2 F (36.8 C)  Recent Labs  Lab 01/08/22 1503 01/13/22 2046  WBC 14.8* 7.0  CREATININE 0.99 0.78    Estimated Creatinine Clearance: 74.9 mL/min (by C-G formula based on SCr of 0.78 mg/dL).    Allergies  Allergen Reactions   Strawberry Extract Itching, Swelling and Anaphylaxis    Mouth swells and gets itchy    Antimicrobials this admission: 10/23 vancomycin >>  10/23 ceftriaxone >>  Dose adjustments this admission:    Thank you for allowing [place] to participate in this patients care. Jens Som, PharmD 01/13/2022 10:11 PM  **Pharmacist phone directory can be found on [email or web address] listed under [place]**

## 2022-01-13 NOTE — Assessment & Plan Note (Signed)
Continue levothyroxine 

## 2022-01-13 NOTE — Progress Notes (Signed)
When calling the pt about her surgery time change, the pt stated that she was on her way to the hospital to be admitted.

## 2022-01-13 NOTE — H&P (Signed)
History and Physical    Patient: Tammy Good:[id number] DOB: [birth year] DOA: 01/13/2022 DOS: the patient was seen and examined on 01/13/2022 PCP: Default, Provider, MD  Patient coming from: Home  Chief Complaint: No chief complaint on file.  HPI: Tammy Good is a 71 y.o. female with medical history significant of CAD s/p PCI to RCA (2015) and Lcx (2022), PAD with critical limb ischemia and right SFA stenting (10/2020), left SFA stenting and anterior tibial angioplasty (06/2021), HFrEF (EF 40 to 45% by echo 04/2021), atrial fibrillation, insulin dependent T2DM, severe septic arthritis presents for planned amputation of left BKA.  Pt has been following Infectious Disease for several months following MRSA bacteremia from her foot with severe septic arthritis of the left Talonavicular Joint and cuboidal joint with associated osteomyelitis.  Previously has been treated with several rounds of prolonged course of IV antibiotics but ID feels that she has maximized her treatment medically and referred her back to orthopedic Dr. Lucia Gaskins who has recommended pt undergo left below knee amputation.   She underwent pre-op with cardiology on 01/09/22 and has held her Plavix and Eliquis since 01/10/22. No chest pain or shortness of breath.  She has lab work on 01/08/22 with Hbg of 7.0 which has downward trended from 9.5. Repeat today is at 6.9. She denies any dark stool or bleeding. She has hx of colonoscopy 08/2013 with Dr. Collene Mares due to acute anemia and was only noted to have internal hemorrhoids with dark blood product.    Review of Systems: As mentioned in the history of present illness. All other systems reviewed and are negative. Past Medical History:  Diagnosis Date   CAD S/P percutaneous coronary angioplasty 08/2013   100% mRCA - PCI Integrity Resolute DES 3.0 mm x 38 mm - 3.35 mm; PTCA of RPA V 2.0 mm x 15 mm   CHF (congestive heart failure) (HCC)    Cholesteatoma    right   Diabetes  mellitus type 2 in obese (HCC)    On insulin and Invokana   History of osteomyelitis L 5th Toe all 05/2019   s/p Partial Ray Amputation with partial closure; 6 wks Abx & LSFA Atherectomy/DEB PTA with Stent for focal dissection.   Hyperlipidemia with target LDL less than 70    Hypothyroidism (acquired)    Mild essential hypertension    Obesity (BMI 30-39.9) 11/17/2013   PAD (peripheral artery disease) ([place]) 05/26/2019   05/26/19: Abd AoGram- BLE runoff -> L SFA orbital atherectomy - PTA w/ DEB & Stent 6 x 40 Luttonix (for focal dissection) - patent Pop A with 3 V runoff. LEA Dopplers 01/03/2020: RABI (prev) 0.68 (0.53)/ RTBI (prev) 0.58 (0.33); LABI (prev) 0.80 (0.64), LTBI (prev) 0.64 (0.51); R mSFA ~50-74%, L mSFA 50-74%. Patent Prox SFA stent < 49% stenosis   Panhypopituitarism (HCC)    ST elevation myocardial infarction (STEMI) of inferior wall, subsequent episode of care ([place]) 08/2013   80% branch of D1, 40% mid AV groove circumflex, 100% RCA with subacute thrombus -- thrombus extending into RPA V with 100% occlusion after initial angioplasty of mid RCA ;; Post MI ECHO 6/9/'15: EF 50-55%, mild LVH with moderate HK of inferior wall, Gr1 DD, mild LA dilation; mildly reduced RV function   Past Surgical History:  Procedure Laterality Date   ABDOMINAL AORTOGRAM W/LOWER EXTREMITY N/A 05/26/2019   Procedure: ABDOMINAL AORTOGRAM W/LOWER EXTREMITY;  Surgeon: Marty Heck, MD;  Location: [place] [facility];  Service: Cardiovascular;  Laterality: N/A;  ABDOMINAL AORTOGRAM W/LOWER EXTREMITY N/A 11/15/2020   Procedure: ABDOMINAL AORTOGRAM W/LOWER EXTREMITY;  Surgeon: Marty Heck, MD;  Location: [place] [facility];  Service: Cardiovascular;  Laterality: N/A;   ABDOMINAL AORTOGRAM W/LOWER EXTREMITY N/A 04/18/2021   Procedure: ABDOMINAL AORTOGRAM W/LOWER EXTREMITY;  Surgeon: Marty Heck, MD;  Location: [place] [facility];  Service: Cardiovascular;  Laterality: N/A;   ABDOMINAL  AORTOGRAM W/LOWER EXTREMITY Left 07/11/2021   Procedure: ABDOMINAL AORTOGRAM W/LOWER EXTREMITY;  Surgeon: Marty Heck, MD;  Location: [place] [facility];  Service: Cardiovascular;  Laterality: Left;   AMPUTATION Left 05/25/2019   Procedure: AMPUTATION RAY 5th;  Surgeon: Trula Slade, DPM;  Location: [place];  Service: Podiatry;  Laterality: Left;   BONE BIOPSY Left 04/24/2021   Procedure: BONE BIOPSY;  Surgeon: Landis Martins, DPM;  Location: [place];  Service: Podiatry;  Laterality: Left;  left ankle/fibula   BONE BIOPSY Left 07/15/2021   Procedure: LEFT FOOT BONE BIOPSY;  Surgeon: Trula Slade, DPM;  Location: [place];  Service: Podiatry;  Laterality: Left;   Cardiac Event Monitor  July-August 2015   Sinus rhythm with PVCs   CHOLECYSTECTOMY     COLONOSCOPY N/A 08/31/2013   Procedure: COLONOSCOPY;  Surgeon: Juanita Craver, MD;  Location[facility] ENDOSCOPY;  Service: Endoscopy;  Laterality: N/A;   CORONARY STENT INTERVENTION N/A 11/19/2020   Procedure: CORONARY STENT INTERVENTION;  Surgeon: Nelva Bush, MD;  Location: [place] [facility];  Service: Cardiovascular;  Laterality: N/A;   ESOPHAGOGASTRODUODENOSCOPY N/A 09/01/2013   Procedure: ESOPHAGOGASTRODUODENOSCOPY (EGD);  Surgeon: Beryle Beams, MD;  Location: [facility] ENDOSCOPY;  Service: Endoscopy;  Laterality: N/A;  bedside   ESOPHAGOGASTRODUODENOSCOPY (EGD) WITH PROPOFOL N/A 05/26/2021   Procedure: ESOPHAGOGASTRODUODENOSCOPY (EGD) WITH PROPOFOL;  Surgeon: Carol Ada, MD;  Location: [place];  Service: Gastroenterology;  Laterality: N/A;   EYE SURGERY Bilateral    bilateral cataracts   INCISION AND DRAINAGE Left 04/24/2021   Procedure: INCISION AND DRAINAGE;  Surgeon: Landis Martins, DPM;  Location: [place];  Service: Podiatry;  Laterality: Left;   IRRIGATION AND DEBRIDEMENT FOOT Left 07/15/2021   Procedure: IRRIGATION AND DEBRIDEMENT FOOT;  Surgeon: Trula Slade, DPM;  Location: [place];  Service: Podiatry;  Laterality:  Left;   LEFT HEART CATH AND CORONARY ANGIOGRAPHY N/A 11/19/2020   Procedure: LEFT HEART CATH AND CORONARY ANGIOGRAPHY;  Surgeon: Nelva Bush, MD;  Location: [place] [facility];  Service: Cardiovascular;  Laterality: N/A;   LEFT HEART CATHETERIZATION WITH CORONARY ANGIOGRAM N/A 08/30/2013   Procedure: LEFT HEART CATHETERIZATION WITH CORONARY ANGIOGRAM;  Surgeon: Leonie Man, MD;  Location: [facility] CATH LAB: 100% mRCA (thrombus - extends to RPAV), 80% D1, 40% AVG Cx.   PERCUTANEOUS CORONARY STENT INTERVENTION (PCI-S)  08/30/2013   Procedure: PERCUTANEOUS CORONARY STENT INTERVENTION (PCI-S);  Surgeon: Leonie Man, MD;  Location: [facility] CATH LAB;  Integrity Resolute DES 2.0 mm x 38 mm -- 3.35 mm.; PTCA of proximal RPA V. - 3.0 mm x 15 mm balloon   PERIPHERAL VASCULAR ATHERECTOMY  05/26/2019   Procedure: PERIPHERAL VASCULAR ATHERECTOMY;  Surgeon: Marty Heck, MD;  Location: [place] [facility];  Service: Cardiovascular;;  Left SFA   PERIPHERAL VASCULAR BALLOON ANGIOPLASTY Left 07/11/2021   Procedure: PERIPHERAL VASCULAR BALLOON ANGIOPLASTY;  Surgeon: Marty Heck, MD;  Location: [place] [facility];  Service: Cardiovascular;  Laterality: Left;  PT TRUNK / AT   PERIPHERAL VASCULAR INTERVENTION  05/26/2019   Procedure: PERIPHERAL VASCULAR INTERVENTION;  Surgeon: Monica Martinez  J, MD;  Location: [place] [facility];  Service: Cardiovascular;;  Left SFA   PERIPHERAL VASCULAR INTERVENTION Right 11/15/2020   Procedure: PERIPHERAL VASCULAR INTERVENTION;  Surgeon: Marty Heck, MD;  Location: [employer] [facility];  Service: Cardiovascular;  Laterality: Right;  Superficial Femoral Artery   PERIPHERAL VASCULAR INTERVENTION Left 07/11/2021   Procedure: PERIPHERAL VASCULAR INTERVENTION;  Surgeon: Marty Heck, MD;  Location: [place] [facility];  Service: Cardiovascular;  Laterality: Left;  SFA   PITUITARY SURGERY     TEE WITHOUT CARDIOVERSION N/A 04/26/2021   Procedure:  TRANSESOPHAGEAL ECHOCARDIOGRAM (TEE);  Surgeon: Buford Dresser, MD;  Location: [facility] ENDOSCOPY;  Service: Cardiovascular;  Laterality: N/A;   TRANSTHORACIC ECHOCARDIOGRAM  08/30/2013   mild LVH. EF 50-55%. Moderate HK of the entire inferior myocardium. GR 1 DD. Mild LA dilation. Mildly reduced RV function   TYMPANOMASTOIDECTOMY Right 12/28/2017   Procedure: RIGHT TYMPANOMASTOIDECTOMY;  Surgeon: Leta Baptist, MD;  Location: [place];  Service: ENT;  Laterality: Right;   Social History:  reports that she has quit smoking. She has never used smokeless tobacco. She reports that she does not drink alcohol and does not use drugs.  Allergies  Allergen Reactions   Strawberry Extract Itching, Swelling and Anaphylaxis    Mouth swells and gets itchy    Family History  Problem Relation Age of Onset   Cancer Mother 23       multiple myeloma   Heart attack Father 67   Cancer Sister    Alzheimer's disease Maternal Grandmother     Prior to Admission medications   Medication Sig Start Date End Date Taking? Authorizing Provider  acetaminophen (TYLENOL) 500 MG tablet Take 500-1,000 mg by mouth 3 (three) times daily as needed for headache (pain).    [provider]  apixaban (ELIQUIS) 5 MG TABS tablet Take 1 tablet (5 mg total) by mouth 2 (two) times daily. 08/06/21   Leonie Man, MD  atorvastatin (LIPITOR) 40 MG tablet Take 1 tablet (40 mg total) by mouth daily at 6 PM. 09/03/13   Thereasa Solo, Kimberlee Nearing, MD  B-D UF III MINI PEN NEEDLES 31G X 5 MM MISC Inject into the skin. 05/28/20   [provider]  clopidogrel (PLAVIX) 75 MG tablet TAKE 1 TABLET BY MOUTH DAILY 11/20/21   Leonie Man, MD  Continuous Blood Gluc Sensor (FREESTYLE LIBRE 2 SENSOR) MISC Apply topically every 14 (fourteen) days. 06/12/20   [provider]  dapagliflozin propanediol (FARXIGA) 10 MG TABS tablet Take 10 mg by mouth every morning.    [provider]  dexamethasone (DECADRON)  0.5 MG tablet Take 2 tablets (1 mg total) by mouth daily. Patient taking differently: Take 0.5 mg by mouth daily. 07/17/21 07/17/22  Masters, Katie, DO  ferrous sulfate 325 (65 FE) MG tablet Take 325 mg by mouth at bedtime.    [provider]  furosemide (LASIX) 20 MG tablet Take 1 tablet (20 mg total) by mouth daily as needed for fluid or edema. 06/03/21 06/03/22  Rai, Ripudeep K, MD  insulin aspart (NOVOLOG FLEXPEN) 100 UNIT/ML FlexPen Inject 0-7 Units into the skin 3 (three) times daily with meals. Sliding scale insulin    [provider]  Insulin Glargine (BASAGLAR KWIKPEN [place]) Inject 42 Units into the skin at bedtime.    [provider]  levothyroxine (SYNTHROID) 125 MCG tablet Take 1 tablet (125 mcg total) by mouth daily before breakfast. 11/21/20   Baglia, Corrina, PA-C  linezolid (ZYVOX)  600 MG tablet Take 1 tablet (600 mg total) by mouth 2 (two) times daily. 11/22/21   Lorenda Peck, DPM  metoCLOPramide (REGLAN) 5 MG tablet Take 1 tablet (5 mg total) by mouth 3 (three) times daily as needed for nausea or vomiting. Take before meals as needed, if having nausea 06/03/21 06/03/22  Rai, Vernelle Emerald, MD  midodrine (PROAMATINE) 5 MG tablet Take 5 mg by mouth in the morning and at bedtime.    [provider]  nitroGLYCERIN (NITROSTAT) 0.4 MG SL tablet Place 0.4 mg under the tongue every 5 (five) minutes x 3 doses as needed for chest pain. 08/23/21   [provider]  nystatin cream (MYCOSTATIN) Apply 1 application topically 2 (two) times daily. Patient taking differently: Apply 1 application  topically 2 (two) times daily as needed (rash). 08/29/19   Carlyle Basques, MD  pantoprazole (PROTONIX) 40 MG tablet Take 40 mg by mouth daily.    [provider]  polyethylene glycol powder (GLYCOLAX/MIRALAX) 17 GM/SCOOP powder Take 1 capful with water (17 g) by mouth daily. Patient taking differently: Take 17 g by mouth daily as needed (constipation.). 06/04/21   Rai,  Vernelle Emerald, MD  Propylene Glycol (SYSTANE COMPLETE) 0.6 % SOLN Place 1 drop into both eyes 2 (two) times daily as needed (dry/irritated eyes.).    [provider]  traMADol (ULTRAM) 50 MG tablet Take 50 mg by mouth every 6 (six) hours as needed (pain.).    [provider]    Physical Exam: Vitals:   01/13/22 2000  BP: 129/65  Pulse: 81  Resp: 19  Temp: 98.2 F (36.8 C)  TempSrc: Oral  SpO2: (!) 89%   Constitutional: NAD, calm, comfortable, nontoxic well-appearing appearing elderly female laying in bed Eyes: lids and conjunctivae normal ENMT: Mucous membranes are moist. Neck: normal, supple Respiratory: clear to auscultation bilaterally, no wheezing, no crackles. Normal respiratory effort. No accessory muscle use.  Cardiovascular: Regular rate and rhythm, no murmurs / rubs / gallops. No extremity edema. 2+ pedal pulses on the right but weak pulse on the left. No carotid bruits.  Abdomen: Soft, nontender nondistended.  Bowel sounds positive.  Musculoskeletal: no clubbing / cyanosis.  Amputation of the fifth digit of the left foot.  Good ROM, no contractures. Normal muscle tone.  Skin: no rashes, lesions, ulcers. No induration Neurologic: CN 2-12 grossly intact. Strength 5/5 in all 4.  Psychiatric: Normal judgment and insight. Alert and oriented x 3. Normal mood. Data Reviewed:  See HPI  Assessment and Plan: * Osteomyelitis ([place]) Prolonged hx of osteomyelitis with severe Septic Arthritis of the left Talonavicular Joint and Cuboid joint followed by ID.  -ortho has decided to take for left BKA tomorrow -start IV vancomycin and Rocephin  -transfuse 1u pRBC for acute on chronic anemia  -NPO past midnight -Eliquis and plavix held since 01/10/22. Will need to resume based on recommendations from ortho and her hemoglobin trend.  Essential hypertension Continue home meds pending med rec  Hypothyroidism Continue levothyroxine  PAD (peripheral artery disease)  (HCC) S/P right SFA stenting (10/2020), left SFA stenting and anterior tibial angioplasty (06/2021) -Follows with [name] with vascular outpatient  Diabetes mellitus, type 2 ([place]) Insulin dependent -place on moderate SSI ACHS  Normocytic anemia Baseline around 9 with Hgb of 6.9 on presentation. -denies any melena, hematochezia  -has hx of colonoscopy 08/2013 with Dr. Collene Mares due to acute anemia and was only noted to have internal hemorrhoids with dark blood product.  -Transfuse 1u pRBC  and follow post H/H. Goal of Hgb greater than 8 due to her CAD with stents -Plavix and Eliquis currently on hold in anticipation for surgery  Atrial fibrillation ([place]) Rate controlled. Holding Eliquis since 01/10/22 in anticipation for surgery tomorrow.  CAD S/P percutaneous coronary angioplasty -CAD s/p PCI to RCA (2015) and Lcx (2022) -Stable. No cardiac symptoms -Plavix on hold in anticipation for surgery since 01/10/2022.      Advance Care Planning:   Code Status: Full Code   Consults: Orthopedic  Family Communication: none at bedside  Severity of Illness: The appropriate patient status for this patient is INPATIENT. Inpatient status is judged to be reasonable and necessary in order to provide the required intensity of service to ensure the patient's safety. The patient's presenting symptoms, physical exam findings, and initial radiographic and laboratory data in the context of their chronic comorbidities is felt to place them at high risk for further clinical deterioration. Furthermore, it is not anticipated that the patient will be medically stable for discharge from the hospital within 2 midnights of admission.   * I certify that at the point of admission it is my clinical judgment that the patient will require inpatient hospital care spanning beyond 2 midnights from the point of admission due to high intensity of service, high risk for further deterioration and high frequency of surveillance  required.*  Author: Orene Desanctis, DO 01/13/2022 10:31 PM  For on call review [email or web address].

## 2022-01-13 NOTE — Assessment & Plan Note (Signed)
Rate controlled. Holding Eliquis since 01/10/22 in anticipation for surgery tomorrow.

## 2022-01-13 NOTE — Assessment & Plan Note (Addendum)
-  CAD s/p PCI to RCA (2015) and Lcx (2022) -Stable. No cardiac symptoms -Plavix on hold in anticipation for surgery since 01/10/2022.

## 2022-01-13 NOTE — Assessment & Plan Note (Signed)
Insulin dependent -place on moderate SSI ACHS

## 2022-01-13 NOTE — Assessment & Plan Note (Signed)
Prolonged hx of osteomyelitis with severe Septic Arthritis of the left Talonavicular Joint and Cuboid joint followed by ID.  -ortho has decided to take for left BKA tomorrow -start IV vancomycin and Rocephin  -transfuse 1u pRBC for acute on chronic anemia  -NPO past midnight -Eliquis and plavix held since 01/10/22. Will need to resume based on recommendations from ortho and her hemoglobin trend.

## 2022-01-13 NOTE — Assessment & Plan Note (Signed)
Continue home meds pending med rec 

## 2022-01-13 NOTE — Consult Note (Addendum)
Tammy Good is an 71 y.o. female.  HPI: Patient is scheduled for left BKA tomorrow afternoon with Dr. Lucia Gaskins.  She was admitted to the medicine service preoperatively due to abnormal labs, including a hemoglobin of 7, and efforts to medically optimize the patient prior to surgery.  She has not yet seen the hospitalist service since arrival.  She has been dealing with chronic osteomyelitis and septic arthritis in the left hindfoot for many months at this point.  She is exhausted conservative treatment options through infectious disease including several rounds of oral and IV antibiotics.  She has elected to undergo a left BKA for source control and definitive treatment.  She is ready for surgery tomorrow.  Family is present in the room.  She is planning to go to rehab postoperatively she lives alone and does not have much help at home.  Denies any fever, chills, or night sweats.  She does not feel ill at present.  Past Medical History:  Diagnosis Date   CAD S/P percutaneous coronary angioplasty 08/2013   100% mRCA - PCI Integrity Resolute DES 3.0 mm x 38 mm - 3.35 mm; PTCA of RPA V 2.0 mm x 15 mm   CHF (congestive heart failure) (HCC)    Cholesteatoma    right   Diabetes mellitus type 2 in obese (HCC)    On insulin and Invokana   History of osteomyelitis L 5th Toe all 05/2019   s/p Partial Ray Amputation with partial closure; 6 wks Abx & LSFA Atherectomy/DEB PTA with Stent for focal dissection.   Hyperlipidemia with target LDL less than 70    Hypothyroidism (acquired)    Mild essential hypertension    Obesity (BMI 30-39.9) 11/17/2013   PAD (peripheral artery disease) ([place]) 05/26/2019   05/26/19: Abd AoGram- BLE runoff -> L SFA orbital atherectomy - PTA w/ DEB & Stent 6 x 40 Luttonix (for focal dissection) - patent Pop A with 3 V runoff. LEA Dopplers 01/03/2020: RABI (prev) 0.68 (0.53)/ RTBI (prev) 0.58 (0.33); LABI (prev) 0.80 (0.64), LTBI (prev) 0.64 (0.51); R mSFA ~50-74%, L mSFA 50-74%.  Patent Prox SFA stent < 49% stenosis   Panhypopituitarism (HCC)    ST elevation myocardial infarction (STEMI) of inferior wall, subsequent episode of care ([place]) 08/2013   80% branch of D1, 40% mid AV groove circumflex, 100% RCA with subacute thrombus -- thrombus extending into RPA V with 100% occlusion after initial angioplasty of mid RCA ;; Post MI ECHO 6/9/'15: EF 50-55%, mild LVH with moderate HK of inferior wall, Gr1 DD, mild LA dilation; mildly reduced RV function    Past Surgical History:  Procedure Laterality Date   ABDOMINAL AORTOGRAM W/LOWER EXTREMITY N/A 05/26/2019   Procedure: ABDOMINAL AORTOGRAM W/LOWER EXTREMITY;  Surgeon: Marty Heck, MD;  Location: [place] [facility];  Service: Cardiovascular;  Laterality: N/A;   ABDOMINAL AORTOGRAM W/LOWER EXTREMITY N/A 11/15/2020   Procedure: ABDOMINAL AORTOGRAM W/LOWER EXTREMITY;  Surgeon: Marty Heck, MD;  Location: [place] [facility];  Service: Cardiovascular;  Laterality: N/A;   ABDOMINAL AORTOGRAM W/LOWER EXTREMITY N/A 04/18/2021   Procedure: ABDOMINAL AORTOGRAM W/LOWER EXTREMITY;  Surgeon: Marty Heck, MD;  Location: [place] [facility];  Service: Cardiovascular;  Laterality: N/A;   ABDOMINAL AORTOGRAM W/LOWER EXTREMITY Left 07/11/2021   Procedure: ABDOMINAL AORTOGRAM W/LOWER EXTREMITY;  Surgeon: Marty Heck, MD;  Location: [place] [facility];  Service: Cardiovascular;  Laterality: Left;   AMPUTATION Left 05/25/2019   Procedure: AMPUTATION RAY 5th;  Surgeon: [name],  Bonna Gains, DPM;  Location: [place];  Service: Podiatry;  Laterality: Left;   BONE BIOPSY Left 04/24/2021   Procedure: BONE BIOPSY;  Surgeon: Landis Martins, DPM;  Location: [place];  Service: Podiatry;  Laterality: Left;  left ankle/fibula   BONE BIOPSY Left 07/15/2021   Procedure: LEFT FOOT BONE BIOPSY;  Surgeon: Trula Slade, DPM;  Location: [place];  Service: Podiatry;  Laterality: Left;   Cardiac Event Monitor  July-August 2015   Sinus  rhythm with PVCs   CHOLECYSTECTOMY     COLONOSCOPY N/A 08/31/2013   Procedure: COLONOSCOPY;  Surgeon: Juanita Craver, MD;  Location: [facility] ENDOSCOPY;  Service: Endoscopy;  Laterality: N/A;   CORONARY STENT INTERVENTION N/A 11/19/2020   Procedure: CORONARY STENT INTERVENTION;  Surgeon: Nelva Bush, MD;  Location: [place] [facility];  Service: Cardiovascular;  Laterality: N/A;   ESOPHAGOGASTRODUODENOSCOPY N/A 09/01/2013   Procedure: ESOPHAGOGASTRODUODENOSCOPY (EGD);  Surgeon: Beryle Beams, MD;  Location: [facility] ENDOSCOPY;  Service: Endoscopy;  Laterality: N/A;  bedside   ESOPHAGOGASTRODUODENOSCOPY (EGD) WITH PROPOFOL N/A 05/26/2021   Procedure: ESOPHAGOGASTRODUODENOSCOPY (EGD) WITH PROPOFOL;  Surgeon: Carol Ada, MD;  Location: [place];  Service: Gastroenterology;  Laterality: N/A;   EYE SURGERY Bilateral    bilateral cataracts   INCISION AND DRAINAGE Left 04/24/2021   Procedure: INCISION AND DRAINAGE;  Surgeon: Landis Martins, DPM;  Location: [place];  Service: Podiatry;  Laterality: Left;   IRRIGATION AND DEBRIDEMENT FOOT Left 07/15/2021   Procedure: IRRIGATION AND DEBRIDEMENT FOOT;  Surgeon: Trula Slade, DPM;  Location: [place];  Service: Podiatry;  Laterality: Left;   LEFT HEART CATH AND CORONARY ANGIOGRAPHY N/A 11/19/2020   Procedure: LEFT HEART CATH AND CORONARY ANGIOGRAPHY;  Surgeon: Nelva Bush, MD;  Location: [place] [facility];  Service: Cardiovascular;  Laterality: N/A;   LEFT HEART CATHETERIZATION WITH CORONARY ANGIOGRAM N/A 08/30/2013   Procedure: LEFT HEART CATHETERIZATION WITH CORONARY ANGIOGRAM;  Surgeon: Leonie Man, MD;  Location: [facility] CATH LAB: 100% mRCA (thrombus - extends to RPAV), 80% D1, 40% AVG Cx.   PERCUTANEOUS CORONARY STENT INTERVENTION (PCI-S)  08/30/2013   Procedure: PERCUTANEOUS CORONARY STENT INTERVENTION (PCI-S);  Surgeon: Leonie Man, MD;  Location: [facility] CATH LAB;  Integrity Resolute DES 2.0 mm x 38 mm -- 3.35 mm.; PTCA of proximal RPA V. - 3.0  mm x 15 mm balloon   PERIPHERAL VASCULAR ATHERECTOMY  05/26/2019   Procedure: PERIPHERAL VASCULAR ATHERECTOMY;  Surgeon: Marty Heck, MD;  Location: [place] [facility];  Service: Cardiovascular;;  Left SFA   PERIPHERAL VASCULAR BALLOON ANGIOPLASTY Left 07/11/2021   Procedure: PERIPHERAL VASCULAR BALLOON ANGIOPLASTY;  Surgeon: Marty Heck, MD;  Location: [place] [facility];  Service: Cardiovascular;  Laterality: Left;  PT TRUNK / AT   PERIPHERAL VASCULAR INTERVENTION  05/26/2019   Procedure: PERIPHERAL VASCULAR INTERVENTION;  Surgeon: Marty Heck, MD;  Location: [place] [facility];  Service: Cardiovascular;;  Left SFA   PERIPHERAL VASCULAR INTERVENTION Right 11/15/2020   Procedure: PERIPHERAL VASCULAR INTERVENTION;  Surgeon: Marty Heck, MD;  Location: [place] [facility];  Service: Cardiovascular;  Laterality: Right;  Superficial Femoral Artery   PERIPHERAL VASCULAR INTERVENTION Left 07/11/2021   Procedure: PERIPHERAL VASCULAR INTERVENTION;  Surgeon: Marty Heck, MD;  Location: [place] [facility];  Service: Cardiovascular;  Laterality: Left;  SFA   PITUITARY SURGERY     TEE WITHOUT CARDIOVERSION N/A 04/26/2021   Procedure: TRANSESOPHAGEAL ECHOCARDIOGRAM (TEE);  Surgeon: Buford Dresser, MD;  Location: [facility] ENDOSCOPY;  Service: Cardiovascular;  Laterality: N/A;   TRANSTHORACIC ECHOCARDIOGRAM  08/30/2013   mild LVH. EF 50-55%. Moderate HK of the entire inferior myocardium. GR 1 DD. Mild LA dilation. Mildly reduced RV function   TYMPANOMASTOIDECTOMY Right 12/28/2017   Procedure: RIGHT TYMPANOMASTOIDECTOMY;  Surgeon: Leta Baptist, MD;  Location: [place];  Service: ENT;  Laterality: Right;    Family History  Problem Relation Age of Onset   Cancer Mother 29       multiple myeloma   Heart attack Father 23   Cancer Sister    Alzheimer's disease Maternal Grandmother     Social History:  reports that she has quit smoking. She has never  used smokeless tobacco. She reports that she does not drink alcohol and does not use drugs.  Allergies:  Allergies  Allergen Reactions   Strawberry Extract Itching, Swelling and Anaphylaxis    Mouth swells and gets itchy    Medications: I have reviewed the patient's current medications.  No results found for this or any previous visit (from the past 48 hour(s)).  No results found.  Review of Systems  Constitutional:  Negative for chills and diaphoresis.  HENT:  Negative for drooling, facial swelling and voice change.   Eyes:  Negative for redness and visual disturbance.  Respiratory:  Negative for choking, shortness of breath and wheezing.   Gastrointestinal:  Negative for vomiting.  Musculoskeletal:  Positive for arthralgias and joint swelling.  Skin:  Negative for wound.  Neurological:  Negative for syncope, facial asymmetry and speech difficulty.  Psychiatric/Behavioral:  Negative for agitation, behavioral problems and confusion.    There were no vitals taken for this visit. Physical Exam Constitutional:      General: She is not in acute distress.    Appearance: Normal appearance.  HENT:     Head: Normocephalic and atraumatic.     Mouth/Throat:     Mouth: Mucous membranes are moist.  Eyes:     General: No scleral icterus.    Extraocular Movements: Extraocular movements intact.  Pulmonary:     Effort: Pulmonary effort is normal. No respiratory distress.  Musculoskeletal:        General: Swelling and tenderness present.     Cervical back: Normal range of motion.     Comments: Examination left lower extremity shows swelling globally about the ankle and hindfoot.  She has tenderness to palpation about the ankle and hindfoot.  No gross deformity.  There is prior healed surgical incision.  No open wounds.  There is dry flaky skin.  No significant erythema.  Tolerates gentle range of motion at the ankle.  Baseline sensory deficits noted to light touch.  Calf soft nontender.   Warm and well-perfused distally.  Skin:    General: Skin is warm and dry.  Neurological:     General: No focal deficit present.     Mental Status: She is alert and oriented to person, place, and time.  Psychiatric:        Mood and Affect: Mood normal.        Behavior: Behavior normal.     Assessment/Plan: Chronic left hindfoot osteomyelitis and septic arthritis  Plan for left BKA tomorrow afternoon with Dr. Lucia Gaskins as scheduled.  We discussed at length the risk, benefits, and alternatives to surgery.  She opted to proceed.    Hopefully she will be seen by the medicine service soon and will be medically optimized prior to surgery tomorrow.  We did discuss that she will likely  require blood transfusion and IV antibiotics.  She was understanding of this.  All questions were answered.  -N.p.o. past midnight -She is planning for rehab facility postoperatively.   Aniceto Kyser J Martinique 01/13/2022, 7:34 PM

## 2022-01-13 NOTE — Assessment & Plan Note (Signed)
S/P right SFA stenting (10/2020), left SFA stenting and anterior tibial angioplasty (06/2021) -Follows with [name] with vascular outpatient

## 2022-01-14 ENCOUNTER — Inpatient Hospital Stay (HOSPITAL_COMMUNITY): Payer: Medicare Other | Admitting: Physician Assistant

## 2022-01-14 ENCOUNTER — Other Ambulatory Visit: Payer: Self-pay

## 2022-01-14 ENCOUNTER — Encounter (HOSPITAL_COMMUNITY)
Disposition: A | Discharge status: Rehabilitation Facility | Payer: Self-pay | Source: Home / Self Care | Attending: Internal Medicine

## 2022-01-14 ENCOUNTER — Inpatient Hospital Stay (HOSPITAL_COMMUNITY): Admission: RE | Admit: 2022-01-14 | Payer: Medicare Other | Source: Home / Self Care | Admitting: Orthopaedic Surgery

## 2022-01-14 ENCOUNTER — Encounter (HOSPITAL_COMMUNITY): Payer: Self-pay | Admitting: Family Medicine

## 2022-01-14 DIAGNOSIS — I509 Heart failure, unspecified: Secondary | ICD-10-CM

## 2022-01-14 DIAGNOSIS — Z9861 Coronary angioplasty status: Secondary | ICD-10-CM | POA: Diagnosis not present

## 2022-01-14 DIAGNOSIS — M86672 Other chronic osteomyelitis, left ankle and foot: Secondary | ICD-10-CM | POA: Diagnosis not present

## 2022-01-14 DIAGNOSIS — I2510 Atherosclerotic heart disease of native coronary artery without angina pectoris: Secondary | ICD-10-CM

## 2022-01-14 DIAGNOSIS — Z794 Long term (current) use of insulin: Secondary | ICD-10-CM

## 2022-01-14 DIAGNOSIS — E1169 Type 2 diabetes mellitus with other specified complication: Secondary | ICD-10-CM

## 2022-01-14 DIAGNOSIS — I480 Paroxysmal atrial fibrillation: Secondary | ICD-10-CM | POA: Diagnosis not present

## 2022-01-14 DIAGNOSIS — I110 Hypertensive heart disease with heart failure: Secondary | ICD-10-CM

## 2022-01-14 DIAGNOSIS — Z87891 Personal history of nicotine dependence: Secondary | ICD-10-CM

## 2022-01-14 DIAGNOSIS — I252 Old myocardial infarction: Secondary | ICD-10-CM

## 2022-01-14 HISTORY — PX: APPLICATION OF WOUND VAC: SHX5189

## 2022-01-14 HISTORY — PX: AMPUTATION: SHX166

## 2022-01-14 LAB — CBC
HCT: 24.8 % — ABNORMAL LOW (ref 36.0–46.0)
Hemoglobin: 7.4 g/dL — ABNORMAL LOW (ref 12.0–15.0)
MCH: 26.5 pg (ref 26.0–34.0)
MCHC: 29.8 g/dL — ABNORMAL LOW (ref 30.0–36.0)
MCV: 88.9 fL (ref 80.0–100.0)
Platelets: 417 10*3/uL — ABNORMAL HIGH (ref 150–400)
RBC: 2.79 MIL/uL — ABNORMAL LOW (ref 3.87–5.11)
RDW: 20.4 % — ABNORMAL HIGH (ref 11.5–15.5)
WBC: 6.3 10*3/uL (ref 4.0–10.5)
nRBC: 0 % (ref 0.0–0.2)

## 2022-01-14 LAB — GLUCOSE, CAPILLARY
Glucose-Capillary: 184 mg/dL — ABNORMAL HIGH (ref 70–99)
Glucose-Capillary: 79 mg/dL (ref 70–99)
Glucose-Capillary: 94 mg/dL (ref 70–99)
Glucose-Capillary: 126 mg/dL — ABNORMAL HIGH (ref 70–99)
Glucose-Capillary: 142 mg/dL — ABNORMAL HIGH (ref 70–99)
Glucose-Capillary: 311 mg/dL — ABNORMAL HIGH (ref 70–99)
Glucose-Capillary: 293 mg/dL — ABNORMAL HIGH (ref 70–99)

## 2022-01-14 LAB — PREPARE RBC (CROSSMATCH)

## 2022-01-14 SURGERY — AMPUTATION BELOW KNEE
Anesthesia: General | Site: Knee | Laterality: Left

## 2022-01-14 MED ORDER — FERROUS SULFATE 325 (65 FE) MG PO TABS
325.0000 mg | ORAL_TABLET | Freq: Every day | ORAL | Status: DC
  Administered 2022-01-14 – 2022-01-16 (×3): 325 mg via ORAL
  Filled 2022-01-14 (×3): qty 1

## 2022-01-14 MED ORDER — INSULIN ASPART 100 UNIT/ML FLEXPEN: 0.0000 [IU] | PEN_INJECTOR | Freq: Three times a day (TID) | SUBCUTANEOUS | Status: DC

## 2022-01-14 MED ORDER — CHLORHEXIDINE GLUCONATE 0.12 % MT SOLN: 15.0000 mL | Freq: Once | OROMUCOSAL | Status: AC

## 2022-01-14 MED ORDER — FUROSEMIDE 20 MG PO TABS: 20.0000 mg | ORAL_TABLET | Freq: Every day | ORAL | Status: DC | PRN

## 2022-01-14 MED ORDER — BUPIVACAINE-EPINEPHRINE (PF) 0.5% -1:200000 IJ SOLN
INTRAMUSCULAR | Status: DC | PRN
  Administered 2022-01-14: 30 mL

## 2022-01-14 MED ORDER — MIDODRINE HCL 5 MG PO TABS
5.0000 mg | ORAL_TABLET | Freq: Two times a day (BID) | ORAL | Status: DC
  Administered 2022-01-16 – 2022-01-17 (×3): 5 mg via ORAL
  Filled 2022-01-14 (×4): qty 1

## 2022-01-14 MED ORDER — ROPIVACAINE HCL 5 MG/ML IJ SOLN
INTRAMUSCULAR | Status: DC | PRN
  Administered 2022-01-14: 15 mL via PERINEURAL

## 2022-01-14 MED ORDER — HYDRALAZINE HCL 20 MG/ML IJ SOLN: 5.0000 mg | INTRAMUSCULAR | Status: DC | PRN

## 2022-01-14 MED ORDER — METOCLOPRAMIDE HCL 5 MG/ML IJ SOLN: 5.0000 mg | Freq: Three times a day (TID) | INTRAMUSCULAR | Status: DC | PRN

## 2022-01-14 MED ORDER — EPHEDRINE 5 MG/ML INJ
INTRAVENOUS | Status: AC
  Filled 2022-01-14: qty 5

## 2022-01-14 MED ORDER — MORPHINE SULFATE (PF) 2 MG/ML IV SOLN: 0.5000 mg | INTRAVENOUS | Status: DC | PRN

## 2022-01-14 MED ORDER — FENTANYL CITRATE (PF) 100 MCG/2ML IJ SOLN: 25.0000 ug | Freq: Once | INTRAMUSCULAR | Status: AC

## 2022-01-14 MED ORDER — NYSTATIN 100000 UNIT/GM EX CREA: 1.0000 | TOPICAL_CREAM | Freq: Two times a day (BID) | CUTANEOUS | Status: DC | PRN

## 2022-01-14 MED ORDER — ACETAMINOPHEN 500 MG PO TABS: 500.0000 mg | ORAL_TABLET | Freq: Three times a day (TID) | ORAL | Status: DC | PRN

## 2022-01-14 MED ORDER — HYDROCODONE-ACETAMINOPHEN 5-325 MG PO TABS
1.0000 | ORAL_TABLET | ORAL | Status: DC | PRN
  Administered 2022-01-16 – 2022-01-17 (×3): 1 via ORAL
  Filled 2022-01-14 (×3): qty 1

## 2022-01-14 MED ORDER — MIDAZOLAM HCL 2 MG/2ML IJ SOLN
INTRAMUSCULAR | Status: AC
  Filled 2022-01-14: qty 2

## 2022-01-14 MED ORDER — PANTOPRAZOLE SODIUM 40 MG PO TBEC
40.0000 mg | DELAYED_RELEASE_TABLET | Freq: Every day | ORAL | Status: DC
  Administered 2022-01-15 – 2022-01-17 (×3): 40 mg via ORAL
  Filled 2022-01-14 (×3): qty 1

## 2022-01-14 MED ORDER — CHLORHEXIDINE GLUCONATE 0.12 % MT SOLN
OROMUCOSAL | Status: AC
  Administered 2022-01-14: 15 mL via OROMUCOSAL
  Filled 2022-01-14: qty 15

## 2022-01-14 MED ORDER — CEFAZOLIN SODIUM-DEXTROSE 2-4 GM/100ML-% IV SOLN: 2.0000 g | Freq: Four times a day (QID) | INTRAVENOUS | Status: DC

## 2022-01-14 MED ORDER — ONDANSETRON HCL 4 MG/2ML IJ SOLN: 4.0000 mg | Freq: Four times a day (QID) | INTRAMUSCULAR | Status: DC | PRN

## 2022-01-14 MED ORDER — ONDANSETRON HCL 4 MG/2ML IJ SOLN
INTRAMUSCULAR | Status: DC | PRN
  Administered 2022-01-14: 4 mg via INTRAVENOUS

## 2022-01-14 MED ORDER — PROPOFOL 10 MG/ML IV BOLUS
INTRAVENOUS | Status: DC | PRN
  Administered 2022-01-14: 130 mg via INTRAVENOUS

## 2022-01-14 MED ORDER — DOCUSATE SODIUM 100 MG PO CAPS
100.0000 mg | ORAL_CAPSULE | Freq: Two times a day (BID) | ORAL | Status: DC
  Administered 2022-01-14 – 2022-01-17 (×6): 100 mg via ORAL
  Filled 2022-01-14 (×6): qty 1

## 2022-01-14 MED ORDER — 0.9 % SODIUM CHLORIDE (POUR BTL) OPTIME
TOPICAL | Status: DC | PRN
  Administered 2022-01-14: 1000 mL

## 2022-01-14 MED ORDER — PHENYLEPHRINE 80 MCG/ML (10ML) SYRINGE FOR IV PUSH (FOR BLOOD PRESSURE SUPPORT)
PREFILLED_SYRINGE | INTRAVENOUS | Status: DC | PRN
  Administered 2022-01-14 (×2): 160 ug via INTRAVENOUS
  Administered 2022-01-14 (×2): 80 ug via INTRAVENOUS
  Administered 2022-01-14 (×2): 160 ug via INTRAVENOUS

## 2022-01-14 MED ORDER — ACETAMINOPHEN 500 MG PO TABS
500.0000 mg | ORAL_TABLET | Freq: Four times a day (QID) | ORAL | Status: AC
  Administered 2022-01-14 – 2022-01-15 (×4): 500 mg via ORAL
  Filled 2022-01-14 (×4): qty 1

## 2022-01-14 MED ORDER — MIDAZOLAM HCL 2 MG/2ML IJ SOLN
INTRAMUSCULAR | Status: AC
  Administered 2022-01-14: 1 mg via INTRAVENOUS
  Filled 2022-01-14: qty 2

## 2022-01-14 MED ORDER — MIDAZOLAM HCL 2 MG/2ML IJ SOLN: 1.0000 mg | Freq: Once | INTRAMUSCULAR | Status: AC

## 2022-01-14 MED ORDER — DAPAGLIFLOZIN PROPANEDIOL 10 MG PO TABS
10.0000 mg | ORAL_TABLET | Freq: Every morning | ORAL | Status: DC
  Administered 2022-01-15 – 2022-01-17 (×3): 10 mg via ORAL
  Filled 2022-01-14 (×3): qty 1

## 2022-01-14 MED ORDER — METOCLOPRAMIDE HCL 10 MG PO TABS: 5.0000 mg | ORAL_TABLET | Freq: Three times a day (TID) | ORAL | Status: DC | PRN

## 2022-01-14 MED ORDER — LACTATED RINGERS IV SOLN: INTRAVENOUS | Status: DC | PRN

## 2022-01-14 MED ORDER — SODIUM CHLORIDE 0.9% IV SOLUTION: Freq: Once | INTRAVENOUS | Status: DC

## 2022-01-14 MED ORDER — DIPHENHYDRAMINE HCL 12.5 MG/5ML PO ELIX: 12.5000 mg | ORAL_SOLUTION | ORAL | Status: DC | PRN

## 2022-01-14 MED ORDER — LIDOCAINE 2% (20 MG/ML) 5 ML SYRINGE
INTRAMUSCULAR | Status: AC
  Filled 2022-01-14: qty 5

## 2022-01-14 MED ORDER — FENTANYL CITRATE (PF) 100 MCG/2ML IJ SOLN
INTRAMUSCULAR | Status: AC
  Administered 2022-01-14: 25 ug via INTRAVENOUS
  Filled 2022-01-14: qty 2

## 2022-01-14 MED ORDER — DEXAMETHASONE 0.5 MG PO TABS
0.5000 mg | ORAL_TABLET | Freq: Every day | ORAL | Status: DC
  Administered 2022-01-15 – 2022-01-17 (×3): 0.5 mg via ORAL
  Filled 2022-01-14 (×4): qty 1

## 2022-01-14 MED ORDER — ATORVASTATIN CALCIUM 40 MG PO TABS
40.0000 mg | ORAL_TABLET | Freq: Every day | ORAL | Status: DC
  Administered 2022-01-14 – 2022-01-16 (×3): 40 mg via ORAL
  Filled 2022-01-14 (×3): qty 1

## 2022-01-14 MED ORDER — LINEZOLID 600 MG PO TABS
600.0000 mg | ORAL_TABLET | Freq: Two times a day (BID) | ORAL | Status: DC
  Filled 2022-01-14: qty 1

## 2022-01-14 MED ORDER — LEVOTHYROXINE SODIUM 25 MCG PO TABS
125.0000 ug | ORAL_TABLET | Freq: Every day | ORAL | Status: DC
  Administered 2022-01-14 – 2022-01-17 (×4): 125 ug via ORAL
  Filled 2022-01-14 (×4): qty 1

## 2022-01-14 MED ORDER — DEXAMETHASONE SODIUM PHOSPHATE 10 MG/ML IJ SOLN
INTRAMUSCULAR | Status: DC | PRN
  Administered 2022-01-14: 5 mg via INTRAVENOUS

## 2022-01-14 MED ORDER — METHOCARBAMOL 500 MG PO TABS
500.0000 mg | ORAL_TABLET | Freq: Four times a day (QID) | ORAL | Status: DC | PRN
  Administered 2022-01-15: 500 mg via ORAL
  Filled 2022-01-14: qty 1

## 2022-01-14 MED ORDER — FENTANYL CITRATE (PF) 250 MCG/5ML IJ SOLN
INTRAMUSCULAR | Status: AC
  Filled 2022-01-14: qty 5

## 2022-01-14 MED ORDER — ACETAMINOPHEN 325 MG PO TABS
325.0000 mg | ORAL_TABLET | Freq: Four times a day (QID) | ORAL | Status: DC | PRN
  Administered 2022-01-15: 650 mg via ORAL
  Filled 2022-01-14: qty 2

## 2022-01-14 MED ORDER — EPHEDRINE SULFATE-NACL 50-0.9 MG/10ML-% IV SOSY
PREFILLED_SYRINGE | INTRAVENOUS | Status: DC | PRN
  Administered 2022-01-14: 2.5 mg via INTRAVENOUS

## 2022-01-14 MED ORDER — HYDROCODONE-ACETAMINOPHEN 7.5-325 MG PO TABS
1.0000 | ORAL_TABLET | ORAL | Status: DC | PRN
  Administered 2022-01-16: 1 via ORAL
  Filled 2022-01-14: qty 1

## 2022-01-14 MED ORDER — FENTANYL CITRATE (PF) 250 MCG/5ML IJ SOLN
INTRAMUSCULAR | Status: DC | PRN
  Administered 2022-01-14: 50 ug via INTRAVENOUS

## 2022-01-14 MED ORDER — PHENYLEPHRINE 80 MCG/ML (10ML) SYRINGE FOR IV PUSH (FOR BLOOD PRESSURE SUPPORT)
PREFILLED_SYRINGE | INTRAVENOUS | Status: AC
  Filled 2022-01-14: qty 10

## 2022-01-14 MED ORDER — POLYETHYLENE GLYCOL 3350 17 G PO PACK: 17.0000 g | PACK | Freq: Every day | ORAL | Status: DC | PRN

## 2022-01-14 MED ORDER — ONDANSETRON HCL 4 MG/2ML IJ SOLN
INTRAMUSCULAR | Status: AC
  Filled 2022-01-14: qty 2

## 2022-01-14 MED ORDER — ONDANSETRON HCL 4 MG PO TABS
4.0000 mg | ORAL_TABLET | Freq: Four times a day (QID) | ORAL | Status: DC | PRN
  Filled 2022-01-14: qty 1

## 2022-01-14 MED ORDER — METHOCARBAMOL 1000 MG/10ML IJ SOLN: 500.0000 mg | Freq: Four times a day (QID) | INTRAVENOUS | Status: DC | PRN

## 2022-01-14 MED ORDER — NITROGLYCERIN 0.4 MG SL SUBL: 0.4000 mg | SUBLINGUAL_TABLET | SUBLINGUAL | Status: DC | PRN

## 2022-01-14 MED ORDER — LIDOCAINE 2% (20 MG/ML) 5 ML SYRINGE
INTRAMUSCULAR | Status: DC | PRN
  Administered 2022-01-14: 40 mg via INTRAVENOUS

## 2022-01-14 SURGICAL SUPPLY — 48 items
BAG COUNTER SPONGE SURGICOUNT (BAG) IMPLANT
BAG SPNG CNTER NS LX DISP (BAG)
BLADE SAW RECIP 87.9 MT (BLADE) ×2 IMPLANT
BLADE SAW SGTL HD 18.5X60.5X1. (BLADE) ×2 IMPLANT
BLADE SURG 10 STRL SS (BLADE) IMPLANT
BNDG CMPR MED 10X6 ELC LF (GAUZE/BANDAGES/DRESSINGS) ×1
BNDG COHESIVE 6X5 TAN STRL LF (GAUZE/BANDAGES/DRESSINGS) IMPLANT
BNDG ELASTIC 6X10 VLCR STRL LF (GAUZE/BANDAGES/DRESSINGS) ×2 IMPLANT
BNDG ELASTIC 6X5.8 VLCR STR LF (GAUZE/BANDAGES/DRESSINGS) IMPLANT
COVER MAYO STAND STRL (DRAPES) IMPLANT
COVER SURGICAL LIGHT HANDLE (MISCELLANEOUS) ×2 IMPLANT
CUFF TOURN SGL QUICK 34 (TOURNIQUET CUFF) ×1
CUFF TRNQT CYL 34X4.125X (TOURNIQUET CUFF) ×2 IMPLANT
DRAPE INCISE IOBAN 66X45 STRL (DRAPES) ×2 IMPLANT
DRAPE U-SHAPE 47X51 STRL (DRAPES) ×2 IMPLANT
DRESSING PEEL AND PLAC PRVNA20 (GAUZE/BANDAGES/DRESSINGS) IMPLANT
DRSG PEEL AND PLACE PREVENA 20 (GAUZE/BANDAGES/DRESSINGS) ×1
ELECT REM PT RETURN 9FT ADLT (ELECTROSURGICAL) ×1
ELECTRODE REM PT RTRN 9FT ADLT (ELECTROSURGICAL) ×2 IMPLANT
GAUZE PAD ABD 8X10 STRL (GAUZE/BANDAGES/DRESSINGS) IMPLANT
GAUZE SPONGE 4X4 12PLY STRL LF (GAUZE/BANDAGES/DRESSINGS) ×2 IMPLANT
GAUZE XEROFORM 5X9 LF (GAUZE/BANDAGES/DRESSINGS) ×2 IMPLANT
GLOVE BIO SURGEON STRL SZ7.5 (GLOVE) ×2 IMPLANT
GLOVE BIOGEL PI IND STRL 7.0 (GLOVE) IMPLANT
GLOVE BIOGEL PI IND STRL 8 (GLOVE) ×2 IMPLANT
GOWN STRL REUS W/ TWL XL LVL3 (GOWN DISPOSABLE) ×4 IMPLANT
GOWN STRL REUS W/TWL XL LVL3 (GOWN DISPOSABLE) ×2
KIT BASIN OR (CUSTOM PROCEDURE TRAY) ×2 IMPLANT
KIT TURNOVER KIT B (KITS) ×2 IMPLANT
MANIFOLD NEPTUNE II (INSTRUMENTS) ×2 IMPLANT
MARKER SKIN DUAL TIP RULER LAB (MISCELLANEOUS) IMPLANT
NS IRRIG 1000ML POUR BTL (IV SOLUTION) ×2 IMPLANT
PACK ORTHO EXTREMITY (CUSTOM PROCEDURE TRAY) ×2 IMPLANT
PAD ABD 8X10 STRL (GAUZE/BANDAGES/DRESSINGS) ×4 IMPLANT
PAD ARMBOARD 7.5X6 YLW CONV (MISCELLANEOUS) ×2 IMPLANT
PADDING CAST COTTON 6X4 STRL (CAST SUPPLIES) ×4 IMPLANT
SPONGE T-LAP 18X18 ~~LOC~~+RFID (SPONGE) IMPLANT
STAPLER VISISTAT 35W (STAPLE) IMPLANT
STOCKINETTE IMPERVIOUS LG (DRAPES) ×2 IMPLANT
SUT ETHILON 2 0 PSLX (SUTURE) IMPLANT
SUT MON AB 2-0 CT1 27 (SUTURE) IMPLANT
SUT PDS AB 2-0 CT1 27 (SUTURE) ×2 IMPLANT
SUT SILK 2 0 (SUTURE) ×1
SUT SILK 2-0 18XBRD TIE 12 (SUTURE) ×2 IMPLANT
SUT VIC AB 1 CTX 27 (SUTURE) ×4 IMPLANT
TOWEL GREEN STERILE (TOWEL DISPOSABLE) ×2 IMPLANT
TUBE CONNECTING 12X1/4 (SUCTIONS) ×2 IMPLANT
YANKAUER SUCT BULB TIP NO VENT (SUCTIONS) ×2 IMPLANT

## 2022-01-14 NOTE — Anesthesia Procedure Notes (Signed)
Procedure Name: LMA Insertion Date/Time: 01/14/2022 2:29 PM  Performed by: Mariea Clonts, CRNAPre-anesthesia Checklist: Patient identified, Emergency Drugs available, Suction available and Patient being monitored Patient Re-evaluated:Patient Re-evaluated prior to induction Oxygen Delivery Method: Circle System Utilized Preoxygenation: Pre-oxygenation with 100% oxygen Induction Type: IV induction Ventilation: Mask ventilation without difficulty LMA: LMA inserted LMA Size: 4.0 Number of attempts: 1 Airway Equipment and Method: Bite block Placement Confirmation: positive ETCO2 Tube secured with: Tape Dental Injury: Teeth and Oropharynx as per pre-operative assessment

## 2022-01-14 NOTE — Progress Notes (Signed)
PROGRESS NOTE    Tammy Good  AVW:[id number] DOB: 02-23-1951 DOA: 01/13/2022 PCP: Default, Provider, MD    No chief complaint on file.   Brief Narrative:   JAKARA BLATTER is a 71 y.o. female with medical history significant of CAD s/p PCI to RCA (2015) and Lcx (2022), PAD with critical limb ischemia and right SFA stenting (10/2020), left SFA stenting and anterior tibial angioplasty (06/2021), HFrEF (EF 40 to 45% by echo 04/2021), atrial fibrillation, insulin dependent T2DM, severe septic arthritis presents for planned amputation of left BKA.has been followed for her osteomyelitis by ID for months, and decision has been made for BKA, she is on Plavix and Eliquis, her hemoglobin was noted to be low at 6.9, so she has been admitted for medical optimization before surgical intervention.   Assessment & Plan:   Principal Problem:   Osteomyelitis ([place]) Active Problems:   Essential hypertension   Diabetes mellitus, type 2 (HCC)   PAD (peripheral artery disease) (HCC)   Hypothyroidism   Normocytic anemia   CAD S/P percutaneous coronary angioplasty   Atrial fibrillation (HCC)   Osteomyelitis (HCC) - Prolonged hx of osteomyelitis with severe Septic Arthritis of the left Talonavicular Joint and Cuboid joint followed by ID.  -ortho has decided to take for left BKA tomorrow -Continue with IV vancomycin and Rocephin  -Plan for BKA today -Eliquis and plavix held since 01/10/22. Will need to resume based on recommendations from ortho and her hemoglobin trend.   Essential hypertension Not appear to be on any home medications, will keep on as needed hydralazine   Hypothyroidism Continue levothyroxine   PAD (peripheral artery disease) (HCC) S/P right SFA stenting (10/2020), left SFA stenting and anterior tibial angioplasty (06/2021) -On Plavix once cleared by orthopedic   Diabetes mellitus, type 2 (HCC) Insulin dependent -BG are low, hold long-acting insulin, continue with insulin  sliding scale   Normocytic anemia Baseline around 9 with Hgb of 6.9 on presentation. -denies any melena, hematochezia  -has hx of colonoscopy 08/2013 with Dr. Collene Mares due to acute anemia and was only noted to have internal hemorrhoids with dark blood product.  -Transfuse 1u pRBC and follow post H/H. Goal of Hgb greater than 8 due to her CAD with stents -Plavix and Eliquis currently on hold in anticipation for surgery -Globin 7.4 this morning after transfusing 1 unit overnight, she is to receive another unit before surgery today.   Atrial fibrillation ([place]) Rate controlled.  -Eliquis on hold for surgery, resumed once cleared by orthopedic.     CAD S/P percutaneous coronary angioplasty -CAD s/p PCI to RCA (2015) and Lcx (2022) -Stable. No cardiac symptoms -Plavix on hold in anticipation for surgery since 01/10/2022.         DVT prophylaxis:  Code Status: (Full) Family Communication: None at bedside Disposition:   Status is: Inpatient    Consultants:  Orthopedic  Subjective:  Denies any complaints today, no nausea, no vomiting  Objective: Vitals:   01/14/22 0940 01/14/22 1015 01/14/22 1154 01/14/22 1323  BP: (!) 139/57 (!) 140/60 (!) 145/71 (!) 163/58  Pulse: 71 74 80 78  Resp:  '18 20 18  '$ Temp: 98.4 F (36.9 C) 98.5 F (36.9 C) 98.3 F (36.8 C) 98.1 F (36.7 C)  TempSrc: Oral  Oral Oral  SpO2: 99%   99%    Intake/Output Summary (Last 24 hours) at 01/14/2022 1346 Last data filed at 01/14/2022 0307 Gross per 24 hour  Intake 315 ml  Output --  Net 315  ml   There were no vitals filed for this visit.  Examination:  Awake Alert, Oriented X 3, No new F.N deficits, Normal affect Symmetrical Chest wall movement, Good air movement bilaterally, CTAB RRR,No Gallops,Rubs or new Murmurs, No Parasternal Heave +ve B.Sounds, Abd Soft, No tenderness, No rebound - guarding or rigidity. Left foot bandaged    Data Reviewed: I have personally reviewed following labs and  imaging studies  CBC: Recent Labs  Lab 01/08/22 1503 01/13/22 2046 01/14/22 0639  WBC 14.8* 7.0 6.3  HGB 7.0* 6.9* 7.4*  HCT 25.0* 23.7* 24.8*  MCV 91.2 88.8 88.9  PLT 495* 485* 417*    Basic Metabolic Panel: Recent Labs  Lab 01/08/22 1503 01/13/22 2046  NA 134* 139  K 3.8 3.8  CL 103 107  CO2 20* 23  GLUCOSE 324* 127*  BUN 8 9  CREATININE 0.99 0.78  CALCIUM 8.4* 8.3*    GFR: Estimated Creatinine Clearance: 74.9 mL/min (by C-G formula based on SCr of 0.78 mg/dL).  Liver Function Tests: No results for input(s): "AST", "ALT", "ALKPHOS", "BILITOT", "PROT", "ALBUMIN" in the last 168 hours.  CBG: Recent Labs  Lab 01/08/22 1440 01/13/22 2214 01/14/22 0728 01/14/22 1153  GLUCAP 261* 92 184* 79     Recent Results (from the past 240 hour(s))  Surgical pcr screen     Status: Abnormal   Collection Time: 01/08/22  3:03 PM   Specimen: Nasal Mucosa; Nasal Swab  Result Value Ref Range Status   MRSA, PCR POSITIVE (A) NEGATIVE Final    Comment: RESULT CALLED TO, READ BACK BY AND VERIFIED WITH: RN LEANN HUDSON ON 01/08/22 @ 1738 BY DRT    Staphylococcus aureus POSITIVE (A) NEGATIVE Final    Comment: (NOTE) The Xpert SA Assay (FDA approved for NASAL specimens in patients 73 years of age and older), is one component of a comprehensive surveillance program. It is not intended to diagnose infection nor to guide or monitor treatment. Performed at [facility], [place] [address], [place], [place] [address]          Radiology Studies: No results found.      Scheduled Meds:  [MAR Hold] sodium chloride   Intravenous Once   [MAR Hold] dexamethasone  0.5 mg Oral Daily   [MAR Hold] insulin aspart  0-15 Units Subcutaneous TID PC & HS   [MAR Hold] levothyroxine  125 mcg Oral Q0600   Continuous Infusions:  [MAR Hold] cefTRIAXone (ROCEPHIN)  IV Stopped (01/13/22 2311)   [MAR Hold] vancomycin       LOS: 1 day      Tammy Climes, MD [facility]   To contact the attending provider between 7A-7P or the covering provider during after hours 7P-7A, please log into the web site [email or web address] and access using universal [place] password for that web site. If you do not have the password, please call the hospital operator.  01/14/2022, 1:46 PM

## 2022-01-14 NOTE — Care Management (Signed)
  Transition of Care (TOC) Screening Note   Patient Details  Name: Tammy Good Date of Birth: 1950-05-24   Transition of Care [facility]) CM/SW Contact:    Carles Collet, RN Phone Number: 01/14/2022, 8:16 AM    Transition of Care Department [facility]) has reviewed patient and e will continue to monitor patient advancement through interdisciplinary progression rounds.  Per chart review, plan for let BKA today w Dr Lucia Gaskins. Anticipate SNF at DC

## 2022-01-14 NOTE — Anesthesia Postprocedure Evaluation (Signed)
Anesthesia Post Note  Patient: Tammy Good  Procedure(s) Performed: LEFT BELOW THE KNEE AMPUTATION (Left: Knee) WOUND VAC PLACEMENT (Left: Knee)     Patient location during evaluation: PACU Anesthesia Type: General Level of consciousness: awake and alert Pain management: pain level controlled Vital Signs Assessment: post-procedure vital signs reviewed and stable Respiratory status: spontaneous breathing, nonlabored ventilation, respiratory function stable and patient connected to nasal cannula oxygen Cardiovascular status: blood pressure returned to baseline and stable Postop Assessment: no apparent nausea or vomiting Anesthetic complications: no   No notable events documented.  Last Vitals:  Vitals:   01/14/22 1610 01/14/22 1624  BP: (!) 124/48 (!) 121/52  Pulse: 78 80  Resp: 15 14  Temp: 37 C 36.6 C  SpO2: 94% 98%    Last Pain:  Vitals:   01/14/22 1624  TempSrc: Oral  PainSc:                  Effie Berkshire

## 2022-01-14 NOTE — Op Note (Signed)
ARSHIA SPELLMAN female 71 y.o. 01/14/2022  PreOperative Diagnosis: Chronic left hindfoot osteomyelitis  PostOperative Diagnosis: Same  PROCEDURE: Left below the knee amputation Wound VAC placement  SURGEON: Melony Overly, MD  ASSISTANT: Jesse Martinique, PA-C  ANESTHESIA: General with peripheral nerve block  FINDINGS: See below  IMPLANTS: None  INDICATIONS:71 y.o. female had longstanding hide for osteomyelitis.  She was treated by infectious disease and underwent bone biopsy by podiatry.  The bacteria that grew was not amenable to suppressive therapy and therefore she was indicated for below the knee amputation given the chronic infection.   Patient understood the risks, benefits and alternatives to surgery which include but are not limited to wound healing complications, infection, nonunion, malunion, need for further surgery as well as damage to surrounding structures. They also understood the potential for continued pain in that there were no guarantees of acceptable outcome After weighing these risks the patient opted to proceed with surgery.  PROCEDURE:  Patient was identified in the preoperative holding area.  The left leg was marked by myself.  Consent was signed by myself and the patient.   Patient was taken to the operative suite and placed supine on the operative table.  General LMA anesthesia was induced without difficulty. Bump was placed under the operative hip and bone foam was used.  All bony prominences were well padded.  Tourniquet was placed on the operative thigh.  Preoperative antibiotics were given. The extremity was prepped and draped in the usual sterile fashion and surgical timeout was performed.  The limb was elevated and the tourniquet was inflated to 250 mmHg.   We began by marking out the skin incision.  This was done approximately 10-12 cm distal to the tibial tubercle.  This was then taken around the leg to allow for a long and tension-free closure  for the posterior flap.  Then using a 10 blade the circumferential incision was created down to bone anteriorly and to the muscular fascia medially and laterally and then around the back of the posterior leg.  Then using blunt dissection laterally the common peroneal nerve and artery was identified and ligated in a traction neurectomy type procedure.  Then the artery was tied off with a 2-0 silk.  Then the fibula was identified and transected proximal to the tibial cut position with the sagittal saw.  Then the deep tissue was then continued to be transected.  Then the anterior portion of the tibia was further identified in the appropriate position the cut was identified using a ruler.  This was 10 cm distal to the tibial tubercle.  Then sagittal saw was used to cut the tibia.  Then a bone hook was placed within the tibia and the amputation knife was used to further amputate the leg.   Then the tibial nerve was transected in a traction neurectomy fashion.  The neurovascular bundle was tied off with a 2-0 silk tie.  Soft tissue flap was further debulked.  The musculature that remained was slightly dusky but without evidence of gross infection.  After soft tissue flap was further inspected and found to be without gross evidence of infection.  Then the tourniquet was released.  Hemostasis was obtained using Bovie cautery and 2-0 silk ties for active bleeding vessels.  Once adequate hemostasis was obtained the skin flap was closed over the remaining below the knee amputation stump.  This was done using the fascia for the gastrocnemius muscle sutured with a 2-0 PDS to the periosteal tissue and anterior  compartment fascia.  Then the skin was closed in a layered fashion using 2-0 Monocryl suture and 2-0 nylon suture.  This was done with a tension-free closure.  Skin edges were well approximated.    After the wound was closed Wound VAC was placed about the incision and about the distal stump.  Good suction was obtained.   Wound VAC was attached to the wound VAC machine.    Patient was then awakened from anesthesia and taken recovery in stable condition.  There were no complications.  Count was correct at the end of the case.   POST OPERATIVE INSTRUCTIONS: Keep dressing in place Work with physical therapy for mobilization Once discharged from the hospital patient will follow up for wound check in approximately 2 weeks Once skin is adequately healed he will be sent to the prosthetists for evaluation.  Okay to resume DVT prophylaxis once hemoglobin stable postoperatively   TOURNIQUET TIME:less than 1 hour  BLOOD LOSS:  200 mL         DRAINS: none         SPECIMEN: none       COMPLICATIONS:  * No complications entered in OR log *         Disposition: PACU - hemodynamically stable.         Condition: stable

## 2022-01-14 NOTE — Anesthesia Procedure Notes (Signed)
Anesthesia Regional Block: Adductor canal block   Pre-Anesthetic Checklist: , timeout performed,  Correct Patient, Correct Site, Correct Laterality,  Correct Procedure, Correct Position, site marked,  Risks and benefits discussed,  Surgical consent,  Pre-op evaluation,  At surgeon's request and post-op pain management  Laterality: Left  Prep: chloraprep       Needles:  Injection technique: Single-shot  Needle Type: Echogenic Stimulator Needle     Needle Length: 9cm  Needle Gauge: 21     Additional Needles:   Procedures:,,,, ultrasound used (permanent image in chart),,    Narrative:  Start time: 01/14/2022 2:05 PM End time: 01/14/2022 2:10 PM Injection made incrementally with aspirations every 5 mL.  Performed by: Personally  Anesthesiologist: Effie Berkshire, MD  Additional Notes: Patient tolerated the procedure well. Local anesthetic introduced in an incremental fashion under minimal resistance after negative aspirations. No paresthesias were elicited. After completion of the procedure, no acute issues were identified and patient continued to be monitored by RN.

## 2022-01-14 NOTE — Anesthesia Procedure Notes (Signed)
Anesthesia Regional Block: Popliteal block   Pre-Anesthetic Checklist: , timeout performed,  Correct Patient, Correct Site, Correct Laterality,  Correct Procedure, Correct Position, site marked,  Risks and benefits discussed,  Surgical consent,  Pre-op evaluation,  At surgeon's request and post-op pain management  Laterality: Left  Prep: chloraprep       Needles:  Injection technique: Single-shot  Needle Type: Echogenic Stimulator Needle     Needle Length: 9cm  Needle Gauge: 21     Additional Needles:   Procedures:,,,, ultrasound used (permanent image in chart),,    Narrative:  Start time: 01/14/2022 2:00 PM End time: 01/14/2022 2:05 PM Injection made incrementally with aspirations every 5 mL.  Performed by: Personally  Anesthesiologist: Effie Berkshire, MD  Additional Notes: Patient tolerated the procedure well. Local anesthetic introduced in an incremental fashion under minimal resistance after negative aspirations. No paresthesias were elicited. After completion of the procedure, no acute issues were identified and patient continued to be monitored by RN.

## 2022-01-14 NOTE — Transfer of Care (Signed)
Immediate Anesthesia Transfer of Care Note  Patient: Tammy Good  Procedure(s) Performed: LEFT BELOW THE KNEE AMPUTATION (Left: Knee) WOUND VAC PLACEMENT (Left: Knee)  Patient Location: PACU  Anesthesia Type:GA combined with regional for post-op pain  Level of Consciousness: awake, alert  and oriented  Airway & Oxygen Therapy: Patient Spontanous Breathing and Patient connected to nasal cannula oxygen  Post-op Assessment: Report given to RN and Post -op Vital signs reviewed and stable  Post vital signs: Reviewed and stable  Last Vitals:  Vitals Value Taken Time  BP    Temp    Pulse    Resp    SpO2      Last Pain:  Vitals:   01/14/22 1323  TempSrc: Oral  PainSc:          Complications: No notable events documented.

## 2022-01-14 NOTE — Progress Notes (Signed)
     JAMEELA MICHNA is a 71 y.o. female   Orthopaedic diagnosis:Left hindfoot osteomyelitis, chronic  Subjective: Patient seen in preop.  Her questions were answered. Pain in left foot.   Objectyive: Vitals:   01/14/22 1154 01/14/22 1323  BP: (!) 145/71 (!) 163/58  Pulse: 80 78  Resp: 20 18  Temp: 98.3 F (36.8 C) 98.1 F (36.7 C)  SpO2:  99%     Exam: Awake and alert Respirations even and unlabored No acute distress  Left foot swelling and pain with attempted ROM.    Assessment: Left hindfoot osteomyelitis, chronic   Plan: Left BKA with wound vac placement.   We discussed the risks benefits and alternatives and she would like to proceed.    Radene Journey, MD

## 2022-01-15 ENCOUNTER — Encounter (HOSPITAL_COMMUNITY): Payer: Self-pay | Admitting: Orthopaedic Surgery

## 2022-01-15 DIAGNOSIS — M86672 Other chronic osteomyelitis, left ankle and foot: Secondary | ICD-10-CM | POA: Diagnosis not present

## 2022-01-15 LAB — BPAM RBC
Blood Product Expiration Date: 202310312359
Blood Product Expiration Date: 202311232359
ISSUE DATE / TIME: 202310232257
ISSUE DATE / TIME: 202310240953
Unit Type and Rh: 5100
Unit Type and Rh: 5100

## 2022-01-15 LAB — BASIC METABOLIC PANEL
Anion gap: 8 (ref 5–15)
BUN: 10 mg/dL (ref 8–23)
CO2: 24 mmol/L (ref 22–32)
Calcium: 7.8 mg/dL — ABNORMAL LOW (ref 8.9–10.3)
Chloride: 105 mmol/L (ref 98–111)
Creatinine, Ser: 0.63 mg/dL (ref 0.44–1.00)
GFR, Estimated: >60 mL/min (ref >60)
Glucose, Bld: 237 mg/dL — ABNORMAL HIGH (ref 70–99)
Potassium: 3.7 mmol/L (ref 3.5–5.1)
Sodium: 137 mmol/L (ref 135–145)

## 2022-01-15 LAB — TYPE AND SCREEN
ABO/RH(D): O POS
Antibody Screen: NEGATIVE
Unit division: 0
Unit division: 0

## 2022-01-15 LAB — CBC
HCT: 24.8 % — ABNORMAL LOW (ref 36.0–46.0)
Hemoglobin: 7.4 g/dL — ABNORMAL LOW (ref 12.0–15.0)
MCH: 26.2 pg (ref 26.0–34.0)
MCHC: 29.8 g/dL — ABNORMAL LOW (ref 30.0–36.0)
MCV: 87.9 fL (ref 80.0–100.0)
Platelets: 391 10*3/uL (ref 150–400)
RBC: 2.82 MIL/uL — ABNORMAL LOW (ref 3.87–5.11)
RDW: 21.3 % — ABNORMAL HIGH (ref 11.5–15.5)
WBC: 9.2 10*3/uL (ref 4.0–10.5)
nRBC: 0 % (ref 0.0–0.2)

## 2022-01-15 LAB — GLUCOSE, CAPILLARY
Glucose-Capillary: 213 mg/dL — ABNORMAL HIGH (ref 70–99)
Glucose-Capillary: 238 mg/dL — ABNORMAL HIGH (ref 70–99)
Glucose-Capillary: 190 mg/dL — ABNORMAL HIGH (ref 70–99)
Glucose-Capillary: 226 mg/dL — ABNORMAL HIGH (ref 70–99)

## 2022-01-15 MED ORDER — INSULIN GLARGINE-YFGN 100 UNIT/ML ~~LOC~~ SOLN
24.0000 [IU] | Freq: Every day | SUBCUTANEOUS | Status: DC
  Administered 2022-01-15 – 2022-01-16 (×2): 24 [IU] via SUBCUTANEOUS
  Filled 2022-01-15 (×3): qty 0.24

## 2022-01-15 NOTE — TOC Initial Note (Signed)
Transition of Care [facility]) - Initial/Assessment Note    Patient Details  Name: Tammy Good MRN: [id number] Date of Birth: 1950-05-24  Transition [facility]) CM/SW Contact:    Benard Halsted, [place] Phone Number: 01/15/2022, 5:52 PM  Clinical Narrative:                 CSW received consult for possible SNF placement at time of discharge in the event CIR is unable to accept patient. CSW spoke with patient and granddaughter with verbal permission at bedside. Patient reports preference for CIR. Patient expressed understanding of PT recommendation and is agreeable to SNF placement at time of discharge if CIR cannot accept her. Patient reports preference for a nice facility, but not [place] or [facility]. CSW discussed insurance authorization process and provided Medicare SNF ratings list. CSW will send out referrals for review and provide bed offers as available if needed.   Skilled Nursing Rehab Facilities-   [email or web address]   Ratings out of 5 stars (the highest)   Name Address  Phone # [place] Inspection Overall  [facility] [address] [place] '5 5 2 4  '$ [facility]  [address] [place], [place] [phone] '4 2 5 5  '$ [facility] [place], [place] '1 1 1 1  '$ [place], [place] '2 2 4 4  '$ [facility] [address], [place] '1 1 2 1  '$ [place] [place] [phone] '3 2 4 4  '$ [facility] [address], [place] '5 1 3 3  '$ [facility] [address] [place] '5 2 3 4  '$ [address] ([place]) [place] '4 1 2 1  '$ [facility] [phone], [place] [phone] '4 1 1 1  '$ [facility] [address], [facility] [phone] '3 1 2 1  '$ [facility] ([place]) [place] [place], [place]  [phone] '4 1 1 1  '$ Dustin Flock [address] [phone] '4 2 4 4          '$ [facility] [place] '4 1 3 2  '$ [facility] [place] [address] [place] '3 1 5 4  '$ [facility], [place], [address] [phone] '1 1 1 1  '$ [facility] [address] [phone] '2 2 3 3          '$ [address] (no [facility]) [place], [place] [phone] '5 5 5 5  '$ [facility] (No Humana) [address] [phone] [address], [place] '4 1 4 3  '$ [facility] (No UHC) [place], [place] '5 5 5 5  '$ [facility] [address], [address] [phone] '2 3 5 5  '$ [facility] [place] [address] [place] '1 1 2 1  '$ [facility] [address] [place] '3 1 1 1  '$ [place] O'Brien, [place] '5 2 4 5  '$ [facility]  [address], [place] '2 2 1 1  '$ [facility] [address] [place] '3 2 1 1  '$ [facility] [place], [place] '2 2 2 2          '$ [facility] [address] [place] '1 1 1 1  '$ [place]  Dr, Ellender Hose  [phone] '2 4 2 2  '$ [facility]  [address], [place] [phone] '4 2 3 3  '$ [facility] [place] [address], [place] '2 1 1 1  '$ [place] (No Humana) [address], [place] [phone] '2 2 3 3  '$ [facility] [address], [place] [phone] '2 1 1 1          '$ [facility] [place], [place] '5 4 5 5  '$ [facility] [place] ([place])  [address], [place] '2 1 2 1  '$ [facility]) [place] [address] [place] '3 1 4 3  '$ [facility] [address], [place] '3 3 4 4  '$ [address] [place],  [place] '2 3 1 1  '$ [facility] [place]) [address] [place] [phone] '2 1 4 3     '$ Expected Discharge Plan: IP Rehab Facility Barriers to Discharge: Continued Medical Work up   Patient Goals and CMS Choice Patient states their goals for this hospitalization and ongoing recovery are:: Rehab CMS Medicare.gov Compare Post Acute Care list provided to:: Patient Choice offered to / list presented to : Patient  Expected Discharge Plan and Services Expected Discharge Plan: [place] In-house Referral: Clinical Social Work   Post Acute Care Choice: IP Rehab Living arrangements for the past 2 months: [place]                                      Prior Living Arrangements/Services Living arrangements for the past 2 months: Single Family Home   Patient language and need for interpreter reviewed:: Yes Do you feel safe going back to the place where you live?: Yes      Need for Family Participation in Patient Care: Yes (Comment) Care giver support system in place?: Yes (comment) Current home services: DME Criminal Activity/Legal Involvement Pertinent to Current Situation/Hospitalization: No - Comment as needed  Activities of Daily Living      Permission Sought/Granted Permission sought to share information with : Facility [phone], Family Supports Permission granted to share information with : Yes, Verbal Permission Granted     Permission granted to share info w AGENCY: SNFs        Emotional Assessment Appearance:: Appears stated age Attitude/Demeanor/Rapport: Engaged, Charismatic Affect (typically observed): Accepting, Appropriate, Pleasant Orientation: : Oriented to Self, Oriented to Place, Oriented to  Time, Oriented to Situation Alcohol / Substance Use: Not Applicable Psych Involvement: No (comment)  Admission diagnosis:  Osteomyelitis (HCC) [M86.9] Patient Active Problem List   Diagnosis  Date Noted   Osteomyelitis ([place]) 01/13/2022   Normocytic anemia 01/13/2022   Atrial fibrillation ([place]) 08/15/2021   Septic arthritis of foot ([place]) 08/02/2021   Medication monitoring encounter 08/02/2021   Acute on chronic systolic CHF (congestive heart failure) ([place]) 06/02/2021   Hypothyroidism 06/02/2021   Ulcer of esophagus without bleeding    Pressure injury of skin 05/31/2021   Hypokalemia 05/30/2021   Symptomatic anemia 05/29/2021   AKI (acute kidney injury) ([place]) 05/29/2021   GI bleed 05/29/2021   Liver lesion 05/29/2021   Vertigo 05/24/2021   Generalized weakness 05/24/2021   Tenosynovitis of left ankle 04/25/2021   Thrombocytosis 04/23/2021   Fatty liver 06/19/2020   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 ([place]) [address]   Atherosclerotic heart disease  of native coronary artery without angina pectoris 03/20/2020   Epigastric pain 03/20/2020   Nausea 03/20/2020   Absence of toe ([place]) 06/28/2019   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   PAD (peripheral artery disease) ([place]) 05/26/2019   Osteomyelitis of left foot ([place]) 05/24/2019   Cellulitis and abscess of foot, except toes 05/18/2019   Chronic osteomyelitis of ankle and foot ([place]) 05/18/2019   Cholesteatoma 02/24/2019   Cellulitis and abscess of toe 11/09/2018   Colon cancer screening 11/30/2017   Long term (current) use of insulin ([place]) 03/05/2017   Iron deficiency anemia 12/10/2014   Obesity (BMI 30-39.9) 11/17/2013   Diabetes mellitus, type 2 ([place])    Essential hypertension    Right thigh pain 11/01/2013   PVC's (premature ventricular contractions) 11/01/2013   Status post insertion of drug eluting coronary artery stent to [facility] emergently and PTCA to prox. 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 [place] [address] Phone: [phone] Fax: [phone]     Social Determinants of Health (SDOH) Interventions    Readmission Risk Interventions    07/17/2021    2:40 PM 06/03/2021   12:08 PM 05/30/2019    1:15 PM  Readmission Risk Prevention Plan  Transportation Screening Complete Complete Complete  PCP or Specialist Appt within 5-7 Days   Complete  Home Care Screening   Complete  Medication Review (RN CM)   Complete  Medication Review Press photographer) Complete Complete   PCP or Specialist appointment within 3-5 days of discharge Complete Complete   HRI or [place] Complete Complete   SW Recovery Care/Counseling Consult Complete Complete   Palliative Care Screening Not Applicable Not [place] Not Applicable Not Applicable

## 2022-01-15 NOTE — Progress Notes (Signed)
  Progress Note   Patient: Tammy Good KKX:[id number] DOB: [birth year] DOA: 01/13/2022     2 DOS: the patient was seen and examined on 01/15/2022 at 10:25AM      Brief hospital course: Mrs. Hoppel is a 71 year old F with PVD, history of bilateral SFA stents, CAD status post PCI last in 2022, CHF EF 40 to 45%, atrial fibrillation, and DM who presented for medical to mentation before amputation of her left foot.     Assessment and Plan: * Osteomyelitis ([place]) Status post left BKA by Dr. Lucia Gaskins on 10/24 - Postoperative care by orthopedics - Hold Eliquis and Plavix given anemia -Stop antibiotics now that osteomyelitis has been amputated   Essential hypertension -Continue as needed hydralazine  Hypothyroidism -Continue levothyroxine  PAD (peripheral artery disease) (HCC) -Continue atorvastatin  Diabetes mellitus, type 2 (HCC) Glucose elevated - Resume glargine - Continue sliding scale correction -Continue Farxiga     Normocytic anemia Hemoglobin baseline around 9, 6.9 on admission, transfused 1 unit already Hemoglobin down to the sevens today -Trend hemoglobin - Transfusion threshold 7 g/dL  Atrial fibrillation (HCC) -Hold Eliquis  CAD S/P percutaneous coronary angioplasty  Chronic hypotension - Continue midodrine        Subjective: No new complaints, no fever, confusion, mild pain, no clinical bleeding     Physical Exam: BP (!) 141/65 (BP Location: Right Arm)   Pulse 71   Temp 98.2 F (36.8 C) (Oral)   Resp 15   SpO2 100%   Elderly adult female, sitting up in recliner, pleasant and interactive RRR, no murmurs, no peripheral edema Respiratory rate normal, lungs clear without rales or wheezes Abdomen soft without tenderness palpation or guarding, no ascites or distention Attention normal, affect normal, judgment insight appear normal    Data Reviewed: Orthopedics note reviewed Elevated Hemoglobin only 7.4, no change from yesterday Basic  metabolic panel unremarkable  Family Communication: None present    Disposition: Status is: Inpatient         Author: Edwin Dada, MD 01/15/2022 7:19 PM  For on call review [email or web address].

## 2022-01-15 NOTE — Care Management Important Message (Signed)
Important Message  Patient Details  Name: Tammy Good MRN: [id number] Date of Birth: 1950-09-12   Medicare Important Message Given:  Yes     Orbie Pyo 01/15/2022, 3:06 PM

## 2022-01-15 NOTE — Progress Notes (Addendum)
     Tammy Good is a 71 y.o. 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  She is eating.  She states she has minimal discomfort.  She only took 1 Tylenol overnight.  She is pleased so far.  She does have a bit of phantom pain which is expected.  Otherwise no new complaints.  CBC has not been obtained yet this morning but overall she looks well.  Objectyive: Vitals:   01/15/22 0438 01/15/22 0809  BP: (!) 119/52 (!) 134/59  Pulse: 64 66  Resp: 18 18  Temp: 98.2 F (36.8 C) 97.9 F (36.6 C)  SpO2: 100% 100%     Exam: Awake and alert Respirations even and unlabored No acute distress  Left lower extremity shows intact surgical bandage and wound VAC status post BKA.  There is minimal drainage in the wound VAC.  He is able to gently actively range the knee.  The dressing was not removed.  Assessment: Postop day 1 status post the above, doing well   Plan: -Nonweightbearing left lower extremity -PT/OT -Keep dressing clean, dry, and intact -We will consider restarting baseline Eliquis and Plavix once hemoglobin stable.  CBC this morning pending. -Pain control as needed  She is planning for SNF placement postoperatively.   Miaya Lafontant J. [place], PA-C

## 2022-01-15 NOTE — Plan of Care (Signed)
  Problem: Clinical Measurements: Goal: Ability to maintain clinical measurements within normal limits will improve Outcome: Progressing Goal: Will remain free from infection Outcome: Progressing Goal: Respiratory complications will improve Outcome: Progressing   Problem: Pain Managment: Goal: General experience of comfort will improve Outcome: Progressing   Problem: Skin Integrity: Goal: Risk for impaired skin integrity will decrease Outcome: Progressing   Problem: Education: Goal: Ability to describe self-care measures that may prevent or decrease complications (Diabetes Survival Skills Education) will improve Outcome: Progressing

## 2022-01-15 NOTE — Progress Notes (Signed)
Inpatient Rehab Admissions Coordinator:  ° °Per therapy recommendations patient was screened for CIR candidacy by Woodley Petzold, MS, CCC-SLP. At this time, Pt. Appears to be a a potential candidate for CIR. I will place   order for rehab consult per protocol for full assessment. Please contact me any with questions. ° °Millissa Deese, MS, CCC-SLP °Rehab Admissions Coordinator  °[phone] (celll) °[phone] (office) ° °

## 2022-01-15 NOTE — Progress Notes (Signed)
Inpatient Diabetes Program Recommendations  AACE/ADA: New Consensus Statement on Inpatient Glycemic Control (2015)  Target Ranges:  Prepandial:   less than 140 mg/dL      Peak postprandial:   less than 180 mg/dL (1-2 hours)      Critically ill patients:  140 - 180 mg/dL   Lab Results  Component Value Date   GLUCAP 238 (H) 01/15/2022   HGBA1C 5.4 01/08/2022    Review of Glycemic Control  Latest Reference Range & Units 01/14/22 16:24 01/14/22 21:08 01/14/22 22:59 01/15/22 08:07 01/15/22 11:44  Glucose-Capillary 70 - 99 mg/dL 142 (H) 311 (H) 293 (H) 213 (H) 238 (H)   Diabetes history: DM 2 Outpatient Diabetes medications:  Farxiga 10 mg daily Novolog 0-7 units tid with meals Basaglar 42 units q HS Freestyle Libre Current orders for Inpatient glycemic control:  Novolog 0-15 units tid with meals and HS Decadron 0.5 mg daily Inpatient Diabetes Program Recommendations:   Please consider adding Semglee 24 units q HS.   Thanks,  Adah Perl, RN, BC-ADM Inpatient Diabetes Coordinator Pager [phone]  (8a-5p)

## 2022-01-15 NOTE — Evaluation (Signed)
Physical Therapy Evaluation Patient Details Name: Tammy Good MRN: [id number] DOB: 01-24-1951 Today's Date: 01/15/2022  History of Present Illness  71 y/o female admitted on 01/14/22 following L BKA due to septic arthritis of L talonavicular joint with associated osteomyelitis. PMH: CAD s/p PCI to RCA, PAD, HRrEF, AFib, T2DM, septic arthritis  Clinical Impression  Patient admitted following above procedure. PTA, patient lives alone and was independent with use of cane. Patient presents with weakness, impaired balance, decreased activity tolerance, and impaired functional mobility. Patient able to perform bed mobility with supervision. Initial stand required modA+2 but able to progress to minA to stand from bed and BSC. Able to pivot on R foot to transfer to/from [facility] to recliner. Patient is motivated and eager to return to independence. Patient will benefit from skilled PT services during acute stay to address listed deficits. Recommend AIR at discharge to maximize functional independence and safety.        Recommendations for follow up therapy are one component of a multi-disciplinary discharge planning process, led by the attending physician.  Recommendations may be updated based on patient status, additional functional criteria and insurance authorization.  Follow Up Recommendations Acute inpatient rehab (3hours/day)      Assistance Recommended at Discharge Frequent or constant Supervision/Assistance  Patient can return home with the following  A little help with walking and/or transfers;A little help with bathing/dressing/bathroom;Assistance with cooking/housework;Help with stairs or ramp for entrance;Assist for transportation    Equipment Recommendations Rolling Ishika [place] (2 wheels);BSC/3in1;Wheelchair (measurements PT);Wheelchair cushion (measurements PT);Other (comment) (ramp)  Recommendations for Other Services  Rehab consult    Functional Status Assessment Patient has had a recent  decline in their functional status and demonstrates the ability to make significant improvements in function in a reasonable and predictable amount of time.     Precautions / Restrictions Precautions Precautions: Fall Restrictions Weight Bearing Restrictions: Yes LLE Weight Bearing: Non weight bearing      Mobility  Bed Mobility Overal bed mobility: Needs Assistance Bed Mobility: Rolling, Supine to Sit Rolling: Supervision   Supine to sit: Supervision     General bed mobility comments: supervision for safety    Transfers Overall transfer level: Needs assistance Equipment used: Rolling [place] Leinen (2 wheels) Transfers: Sit to/from Stand, Bed to chair/wheelchair/BSC Sit to Stand: Mod assist, +2 physical assistance, +2 safety/equipment, Min assist Stand pivot transfers: Min assist, +2 safety/equipment         General transfer comment: initially modA+2 due to hand placement and uncertainty of patient. On second and third attempt, patient able to stand with minA to steady. Prefers both hands placement on bed or chair prior to standing. Assist for steadying during pivot transfer to [facility] and recliner towards R side    Ambulation/Gait                  Stairs            Wheelchair Mobility    Modified Rankin (Stroke Patients Only)       Balance Overall balance assessment: Needs assistance Sitting-balance support: No upper extremity supported, Feet supported Sitting balance-Leahy Scale: Good     Standing balance support: Bilateral upper extremity supported, Reliant on assistive device for balance Standing balance-Leahy Scale: Poor Standing balance comment: reliant on UE support                             Pertinent Vitals/Pain Pain Assessment Pain Assessment: Faces Faces Pain Scale: No  hurt Pain Intervention(s): Monitored during session    Home Living Family/patient expects to be discharged to:: Private residence Living Arrangements:  Alone Available Help at Discharge: Family;Available PRN/intermittently Type of Home: House Home Access: Stairs to enter Entrance Stairs-Rails: Left Entrance Stairs-Number of Steps: 3-4 at the front, 4 through the garage (typical entrance)   Home Layout: One level Home Equipment: Shower seat;Cane - single point;Rolling Ettie Krontz (2 wheels);Hand held shower head;Toilet riser;BSC/3in1      Prior Function Prior Level of Function : Independent/Modified Independent;Driving             Mobility Comments: uses cane most of the time but will occasionally use RW in the home or out in community. uses electric shopping carts in stores ADLs Comments: has been sponge bathing since L foot issues. Mod I for ADLs, IADLs, drives to appts and stores     Hand Dominance        Extremity/Trunk Assessment   Upper Extremity Assessment Upper Extremity Assessment: Defer to OT evaluation    Lower Extremity Assessment Lower Extremity Assessment: Generalized weakness    Cervical / Trunk Assessment Cervical / Trunk Assessment: Normal  Communication   Communication: No difficulties  Cognition Arousal/Alertness: Awake/alert Behavior During Therapy: WFL for tasks assessed/performed Overall Cognitive Status: Within Functional Limits for tasks assessed                                          General Comments      Exercises Other Exercises Other Exercises: instructed on SLR, heel slides, and hip abduction/adduction   Assessment/Plan    PT Assessment Patient needs continued PT services  PT Problem List Decreased strength;Decreased balance;Decreased mobility;Decreased activity tolerance;Decreased safety awareness;Decreased knowledge of precautions;Decreased knowledge of use of DME       PT Treatment Interventions DME instruction;Gait training;Functional mobility training;Therapeutic activities;Therapeutic exercise;Balance training;Patient/family education    PT Goals (Current  goals can be found in the Care Plan section)  Acute Rehab PT Goals Patient Stated Goal: to be independent PT Goal Formulation: With patient Time For Goal Achievement: 01/29/22 Potential to Achieve Goals: Good    Frequency Min 3X/week     Co-evaluation               AM-PAC PT "6 Clicks" Mobility  Outcome Measure Help needed turning from your back to your side while in a flat bed without using bedrails?: A Little Help needed moving from lying on your back to sitting on the side of a flat bed without using bedrails?: A Little Help needed moving to and from a bed to a chair (including a wheelchair)?: A Little Help needed standing up from a chair using your arms (e.g., wheelchair or bedside chair)?: A Lot Help needed to walk in hospital room?: Total Help needed climbing 3-5 steps with a railing? : Total 6 Click Score: 13    End of Session Equipment Utilized During Treatment: Gait belt Activity Tolerance: Patient tolerated treatment well Patient left: in chair;with call bell/phone within reach Nurse Communication: Mobility status PT Visit Diagnosis: Unsteadiness on feet (R26.81);Muscle weakness (generalized) (M62.81);Other abnormalities of gait and mobility (R26.89)    Time: 4097-3532 PT Time Calculation (min) (ACUTE ONLY): 29 min   Charges:   PT Evaluation $PT Eval Low Complexity: 1 Low          Asier Desroches A. Gilford Rile PT, DPT Acute Rehabilitation Services Office [phone]  Raiza Kiesel A Deniece Rankin 01/15/2022, 10:57 AM

## 2022-01-15 NOTE — NC FL2 (Signed)
MEDICAID FL2 LEVEL OF CARE SCREENING TOOL     IDENTIFICATION  Patient Name: Tammy Good Birthdate: [birth year] Sex: female Admission Date (Current Location): 01/13/2022  [facility] and [place] Number:  Herbalist and Address:  [facility] [place]. [facility], [place] [address] [place], [place] [address]      Provider Number: [id number]  Attending Physician Name and Address:  Edwin Dada, *  Relative Name and Phone Number:       Current Level of Care: Hospital Recommended Level of Care: [place] Prior Approval Number:    Date Approved/Denied:   PASRR Number: [id number]  Discharge Plan: SNF    Current Diagnoses: Patient Active Problem List   Diagnosis Date Noted   Osteomyelitis ([place]) 01/13/2022   Normocytic anemia 01/13/2022   Atrial fibrillation ([place]) 08/15/2021   Septic arthritis of foot ([place]) 08/02/2021   Medication monitoring encounter 08/02/2021   Acute on chronic systolic CHF (congestive heart failure) ([place]) 06/02/2021   Hypothyroidism 06/02/2021   Ulcer of esophagus without bleeding    Pressure injury of skin 05/31/2021   Hypokalemia 05/30/2021   Symptomatic anemia 05/29/2021   AKI (acute kidney injury) ([place]) 05/29/2021   GI bleed 05/29/2021   Liver lesion 05/29/2021   Vertigo 05/24/2021   Generalized weakness 05/24/2021   Tenosynovitis of left ankle 04/25/2021   Thrombocytosis 04/23/2021   Fatty liver 06/19/2020   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 ([place]) 23/55/7322   Atherosclerotic heart disease of native coronary artery without angina pectoris 03/20/2020   Epigastric pain 03/20/2020   Nausea 03/20/2020   Absence of toe ([place]) 06/28/2019   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)    Essential hypertension    Right thigh pain 11/01/2013   PVC's (premature ventricular contractions) 11/01/2013   Status post insertion of drug eluting coronary artery stent to [facility] emergently and PTCA to prox. 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 Will not have wound vac at dc.)                       Personal Care Assistance Level of Assistance  Bathing, Feeding, Dressing Bathing Assistance: Limited assistance Feeding assistance: Independent Dressing Assistance: Limited assistance     Functional Limitations [place]  PT (By licensed PT), OT (By  licensed OT)     PT Frequency: 5x/week OT Frequency: 5x/week            Contractures Contractures Info: Not present    Additional Factors Info  Code Status, Allergies Code Status Info: Full Allergies Info: strawberry extract           Current Medications (01/15/2022):  This is the current hospital active medication list Current Facility-Administered Medications  Medication Dose  Route Frequency Provider Last Rate Last Admin   0.9 %  sodium chloride infusion (Manually program via Guardrails IV Fluids)   Intravenous Once Martinique, Jesse J, PA-C       acetaminophen (TYLENOL) tablet 325-650 mg  325-650 mg Oral Q6H PRN Martinique, Jesse J, PA-C       atorvastatin (LIPITOR) tablet 40 mg  40 mg Oral q1800 Martinique, Jesse J, PA-C   40 mg at 01/15/22 1700   cefTRIAXone (ROCEPHIN) 2 g in sodium chloride 0.9 % 100 mL IVPB  2 g Intravenous Q24H Martinique, Jesse J, PA-C 200 mL/hr at 01/14/22 2253 2 g at 01/14/22 2253   dapagliflozin propanediol (FARXIGA) tablet 10 mg  10 mg Oral q morning Martinique, Jesse J, PA-C   10 mg at 01/15/22 0825   dexamethasone (DECADRON) tablet 0.5 mg  0.5 mg Oral Daily Martinique, Jesse J, PA-C   0.5 mg at 01/15/22 0825   diphenhydrAMINE (BENADRYL) 12.5 MG/5ML elixir 12.5-25 mg  12.5-25 mg Oral Q4H PRN Martinique, Jesse J, PA-C       docusate sodium (COLACE) capsule 100 mg  100 mg Oral BID Martinique, Jesse J, PA-C   100 mg at 01/15/22 9937   ferrous sulfate tablet 325 mg  325 mg Oral QHS Martinique, Jesse J, [place]   325 mg at 01/14/22 2248   furosemide (LASIX) tablet 20 mg  20 mg Oral Daily PRN [place]ique, Jesse J, PA-C       hydrALAZINE (APRESOLINE) injection 5 mg  5 mg Intravenous Q4H PRN Martin[place], Jesse J, PA-C       HYDROcodone-acetaminophen (NORCO) 7.5-325 MG per tablet 1-2 tablet  1-2 tablet Oral Q4H PRN [place], Jesse J, PA-C       HYDROcodone-acetaminophen (NORCO/VICODIN) 5-325 MG per tablet 1-2 tablet  1-2 tablet Oral Q4H PRN [place], Jesse J, PA-C       insulin aspart (novoLOG) injection 0-15 Units  0-15 Units Subcutaneous TID PC & HS Martinique, Jesse J, PA-C   3 Units at 01/15/22 1700   insulin glargine-yfgn (SEMGLEE) injection 24 Units  24 Units Subcutaneous Daily Danford, Suann Larry, MD       levothyroxine (SYNTHROID) tablet 125 mcg  125 mcg Oral Q0600 Martinique, Jesse J, [place]   125 mcg at 01/15/22 1696   methocarbamol (ROBAXIN) tablet 500 mg  500 mg Oral Q6H PRN Martinique, Jesse J, PA-C    500 mg at 01/15/22 0240   Or   methocarbamol (ROBAXIN) 500 mg in dextrose 5 % 50 mL IVPB  500 mg Intravenous Q6H PRN Martin[place], Jesse J, PA-C       metoCLOPramide (REGLAN) tablet 5-10 mg  5-10 mg Oral Q8H PRN Martinique, Jesse J, PA-C       Or   metoCLOPramide (REGLAN) injection 5-10 mg  5-10 mg Intravenous Q8H PRN Martinique, Jesse J, PA-C       midodrine (PROAMATINE) tablet 5 mg  5 mg Oral BID WC Martinique, Jesse J, PA-C       morphine (PF) 2 MG/ML injection 0.5-1 mg  0.5-1 mg Intravenous Q2H PRN  [place], Jesse J, PA-C       nitroGLYCERIN (NITROSTAT) SL tablet 0.4 mg  0.4 mg Sublingual Q5 Min x 3 PRN [place]ique, Jesse J, PA-C       nystatin cream (MYCOSTATIN) 1 Application  1 Application Topical BID PRN Martinique, Jesse J, PA-C       ondansetron [facility]) tablet 4 mg  4 mg Oral Q6H PRN Martinique, Jesse J, PA-C       Or   ondansetron [facility]) injection 4 mg  4 mg Intravenous Q6H PRN Martin[place], Jesse J, PA-C       pantoprazole (PROTONIX) EC tablet 40 mg  40 mg Oral Daily Martinique, Jesse J, [place]   40 mg at 01/15/22 0825   polyethylene glycol (MIRALAX / GLYCOLAX) packet 17 g  17 g Oral Daily PRN Martinique, Jesse J, PA-C       vancomycin [facility]) IVPB 1750 mg/350 mL  1,750 mg Intravenous Q24H Martinique, Jesse J, [place] 175 mL/hr at 01/14/22 2340 1,750 mg at 01/14/22 2340     Discharge Medications: Please see discharge summary for a list of discharge medications.  Relevant Imaging Results:  Relevant Lab Results:   Additional Information SSN# [id number]/ Pt has had 4 Covid shots  Benard Halsted, LCSW

## 2022-01-15 NOTE — Evaluation (Signed)
Occupational Therapy Evaluation Patient Details Name: Tammy Good MRN: [id number] DOB: [birth year] Today's Date: 01/15/2022   History of Present Illness 71 y/o female admitted on 01/14/22 following L BKA due to septic arthritis of L talonavicular joint with associated osteomyelitis. PMH: CAD s/p PCI to RCA, PAD, HRrEF, AFib, T2DM, septic arthritis   Clinical Impression   Patient admitted for the diagnosis and procedure above.  PTA she lives at home alone, but has an excellent social network of family and friends that can assist as needed throughout the day.  Deficits impacting independence are listed below.  She is expressing minimal discomfort thus far, and is very motivated to regain her independence, get her prosthetic leg, and learn to walk again.   OT recommends AIR for post acute rehab, patient can tolerate aggressive 3+ hours of rehab/day, and OT will follow in the acute setting.        Recommendations for follow up therapy are one component of a multi-disciplinary discharge planning process, led by the attending physician.  Recommendations may be updated based on patient status, additional functional criteria and insurance authorization.   Follow Up Recommendations  Acute inpatient rehab (3hours/day)    Assistance Recommended at Discharge Frequent or constant Supervision/Assistance  Patient can return home with the following A lot of help with walking and/or transfers;A lot of help with bathing/dressing/bathroom;Help with stairs or ramp for entrance;Assist for transportation;Assistance with cooking/housework    Functional Status Assessment  Patient has had a recent decline in their functional status and demonstrates the ability to make significant improvements in function in a reasonable and predictable amount of time.  Equipment Recommendations  Wheelchair (measurements OT);Wheelchair cushion (measurements OT)    Recommendations for Other Services       Precautions /  Restrictions Precautions Precautions: Fall Restrictions Weight Bearing Restrictions: Yes LLE Weight Bearing: Non weight bearing      Mobility Bed Mobility Overal bed mobility: Needs Assistance Bed Mobility: Rolling, Supine to Sit Rolling: Supervision   Supine to sit: Supervision          Transfers Overall transfer level: Needs assistance Equipment used: Rolling walker (2 wheels) Transfers: Sit to/from Stand, Bed to chair/wheelchair/BSC Sit to Stand: Mod assist, +2 physical assistance, +2 safety/equipment, Min assist Stand pivot transfers: Min assist, +2 safety/equipment                Balance Overall balance assessment: Needs assistance Sitting-balance support: No upper extremity supported, Feet supported Sitting balance-Leahy Scale: Good     Standing balance support: Bilateral upper extremity supported, Reliant on assistive device for balance Standing balance-Leahy Scale: Poor                             ADL either performed or assessed with clinical judgement   ADL Overall ADL's : Needs assistance/impaired Eating/Feeding: Independent;Bed level   Grooming: Wash/dry hands;Wash/dry face;Oral care;Sitting;Supervision/safety   Upper Body Bathing: Supervision/ safety;Sitting   Lower Body Bathing: Minimal assistance;Bed level;Moderate assistance   Upper Body Dressing : Min guard;Sitting   Lower Body Dressing: Moderate assistance;Bed level;Minimal assistance   Toilet Transfer: Minimal assistance;+2 for safety/equipment;Stand-pivot   Toileting- Clothing Manipulation and Hygiene: Minimal assistance;+2 for safety/equipment;Sit to/from stand               Vision Patient Visual Report: No change from baseline       Perception Perception Perception: Within Functional Limits   Praxis Praxis Praxis: Intact    Pertinent Vitals/Pain  Pain Assessment Pain Assessment: Faces Faces Pain Scale: No hurt Pain Intervention(s): Monitored during  session     Hand Dominance Right   Extremity/Trunk Assessment Upper Extremity Assessment Upper Extremity Assessment: Overall WFL for tasks assessed   Lower Extremity Assessment Lower Extremity Assessment: Defer to PT evaluation   Cervical / Trunk Assessment Cervical / Trunk Assessment: Normal   Communication Communication Communication: No difficulties   Cognition Arousal/Alertness: Awake/alert Behavior During Therapy: WFL for tasks assessed/performed Overall Cognitive Status: Within Functional Limits for tasks assessed                                       General Comments       Exercises     Shoulder Instructions      Home Living Family/patient expects to be discharged to:: Private residence Living Arrangements: Alone Available Help at Discharge: Family;Available PRN/intermittently Type of Home: House Home Access: Stairs to enter [employer] of Steps: 3-4 at the front, 4 through the garage (typical entrance) Entrance Stairs-Rails: Left Home Layout: One level     Bathroom Shower/Tub: Occupational psychologist: Standard Bathroom Accessibility: No   Home Equipment: Shower seat;Cane - single point;Rolling Environmental consultant (2 wheels);Hand held shower head;Toilet riser;BSC/3in1   Additional Comments: pt unable to get off toilet by herself with RW in front of her.      Prior Functioning/Environment Prior Level of Function : Independent/Modified Independent;Driving             Mobility Comments: uses cane most of the time but will occasionally use RW in the home or out in community. uses electric shopping carts in stores ADLs Comments: has been sponge bathing since L foot issues. Mod I for ADLs, IADLs, drives to appts and stores        OT Problem List: Decreased strength;Decreased activity tolerance;Impaired balance (sitting and/or standing)      OT Treatment/Interventions: Self-care/ADL training;Patient/family education;Balance  training;Therapeutic activities;DME and/or AE instruction    OT Goals(Current goals can be found in the care plan section) Acute Rehab OT Goals Patient Stated Goal: To be as independent as possible OT Goal Formulation: With patient Time For Goal Achievement: 01/29/22 Potential to Achieve Goals: Good ADL Goals Pt Will Perform Grooming: with supervision;sitting;standing Pt Will Perform Upper Body Dressing: with set-up;sitting Pt Will Perform Lower Body Dressing: with min guard assist;sit to/from stand Pt Will Transfer to Toilet: ambulating;regular height toilet;bedside commode;with min guard assist Pt Will Perform Toileting - Clothing Manipulation and hygiene: with supervision;sit to/from stand  OT Frequency: Min 2X/week    Co-evaluation PT/OT/SLP Co-Evaluation/Treatment: Yes Reason for Co-Treatment: Complexity of the patient's impairments (multi-system involvement)   OT goals addressed during session: ADL's and self-care      AM-PAC OT "6 Clicks" Daily Activity     Outcome Measure Help from another person eating meals?: None Help from another person taking care of personal grooming?: A Little Help from another person toileting, which includes using toliet, bedpan, or urinal?: A Lot Help from another person bathing (including washing, rinsing, drying)?: A Lot Help from another person to put on and taking off regular upper body clothing?: A Little Help from another person to put on and taking off regular lower body clothing?: A Lot 6 Click Score: 16   End of Session Equipment Utilized During Treatment: Gait belt;Rolling walker (2 wheels) Nurse Communication: Mobility status  Activity Tolerance: Patient tolerated treatment well  Patient left: in chair;with call bell/phone within reach  OT Visit Diagnosis: Unsteadiness on feet (R26.81);Muscle weakness (generalized) (M62.81);Other abnormalities of gait and mobility (R26.89)                Time: 5374-8270 OT Time Calculation (min):  28 min Charges:  OT General Charges $OT Visit: 1 Visit OT Evaluation $OT Eval Moderate Complexity: 1 Mod  01/15/2022  RP, OTR/L  Acute Rehabilitation Services  Office:  [phone]   Metta Clines 01/15/2022, 11:20 AM

## 2022-01-16 DIAGNOSIS — M86672 Other chronic osteomyelitis, left ankle and foot: Secondary | ICD-10-CM | POA: Diagnosis not present

## 2022-01-16 LAB — CBC
HCT: 24.7 % — ABNORMAL LOW (ref 36.0–46.0)
Hemoglobin: 7.2 g/dL — ABNORMAL LOW (ref 12.0–15.0)
MCH: 25.9 pg — ABNORMAL LOW (ref 26.0–34.0)
MCHC: 29.1 g/dL — ABNORMAL LOW (ref 30.0–36.0)
MCV: 88.8 fL (ref 80.0–100.0)
Platelets: 434 10*3/uL — ABNORMAL HIGH (ref 150–400)
RBC: 2.78 MIL/uL — ABNORMAL LOW (ref 3.87–5.11)
RDW: 21 % — ABNORMAL HIGH (ref 11.5–15.5)
WBC: 8.5 10*3/uL (ref 4.0–10.5)
nRBC: 0 % (ref 0.0–0.2)

## 2022-01-16 LAB — GLUCOSE, CAPILLARY
Glucose-Capillary: 88 mg/dL (ref 70–99)
Glucose-Capillary: 173 mg/dL — ABNORMAL HIGH (ref 70–99)
Glucose-Capillary: 173 mg/dL — ABNORMAL HIGH (ref 70–99)
Glucose-Capillary: 162 mg/dL — ABNORMAL HIGH (ref 70–99)

## 2022-01-16 LAB — BASIC METABOLIC PANEL
Anion gap: 8 (ref 5–15)
BUN: 13 mg/dL (ref 8–23)
CO2: 25 mmol/L (ref 22–32)
Calcium: 8.3 mg/dL — ABNORMAL LOW (ref 8.9–10.3)
Chloride: 106 mmol/L (ref 98–111)
Creatinine, Ser: 0.65 mg/dL (ref 0.44–1.00)
GFR, Estimated: >60 mL/min (ref >60)
Glucose, Bld: 120 mg/dL — ABNORMAL HIGH (ref 70–99)
Potassium: 3.5 mmol/L (ref 3.5–5.1)
Sodium: 139 mmol/L (ref 135–145)

## 2022-01-16 LAB — SURGICAL PATHOLOGY

## 2022-01-16 NOTE — Progress Notes (Signed)
.      Tammy Good is a 71 y.o. female    Orthopaedic diagnosis: Status post left BKA 01/14/2022   Subjective: Patient doing well.  She had a bit of pain overnight as block wore off, but is now overall well controlled.  She is hopeful for disposition to inpatient rehab.  Slightly increased output in wound VAC.  Hemoglobin 7.2.  No new concerns.  She feels well and is pleased so far with her recovery.   Objectyive: Vitals:   01/16/22 0755 01/16/22 1355  BP: (!) 117/52 133/62  Pulse: 63 63  Resp: 16 16  Temp: 98 F (36.7 C) 98.7 F (37.1 C)  SpO2: 100% 100%     Exam: Awake and alert Respirations even and unlabored No acute distress   Left lower extremity shows intact surgical bandage and wound VAC status post BKA.  There is minimal drainage in the wound VAC.  He is able to gently actively range the knee.  The dressing was not removed.   Assessment: Postop day 2 status post the above, doing well   Plan: -Nonweightbearing left lower extremity - Continue PT/OT.  Encourage mobilization when able. -Keep dressing clean, dry, and intact -We will reassess hemoglobin tomorrow morning and will consider restarting baseline Eliquis and Plavix tomorrow based on trend -Pain control as needed   She is planning for inpatient rehab postoperatively.  Wound VAC will be removed prior to disposition.  Plan for follow-up outpatient with Dr. Lucia Gaskins at [place] 2 weeks from surgery for wound check and reevaluation.  Would likely leave sutures in place for 4 weeks postoperatively, dependent upon wound healing.   Elorah Dewing J. [place], PA-C

## 2022-01-16 NOTE — Progress Notes (Signed)
Physical Therapy Treatment Patient Details Name: Tammy Good MRN: [id number] DOB: 06/02/1950 Today's Date: 01/16/2022   History of Present Illness 71 y/o female admitted on 01/14/22 following L BKA due to septic arthritis of L talonavicular joint with associated osteomyelitis. PMH: CAD s/p PCI to RCA, PAD, HRrEF, AFib, T2DM, septic arthritis    PT Comments    The pt was agreeable to session and eager to progress strength and mobility. She was educated in lateral scoot transfer and able to complete with minA from elevated surface to lower drop-arm recliner. The pt was then able to complete repeated sit-stand transfers from recliner, remains dependent on BUE support and minA to steady with transfer of UE support from chair to RW. The pt was able to initiate hopping with RLE, but was limited by pain and fatigue at this time. She was able to demo good technique of exercises taught in previous session, educated in additional amputee exercises to improve ROM and strength in LLE. Continue to recommend acute inpatient rehab to maximize return to independence.     Recommendations for follow up therapy are one component of a multi-disciplinary discharge planning process, led by the attending physician.  Recommendations may be updated based on patient status, additional functional criteria and insurance authorization.  Follow Up Recommendations  Acute inpatient rehab (3hours/day)     Assistance Recommended at Discharge Frequent or constant Supervision/Assistance  Patient can return home with the following A little help with walking and/or transfers;A little help with bathing/dressing/bathroom;Assistance with cooking/housework;Help with stairs or ramp for entrance;Assist for transportation   Equipment Recommendations  Rolling walker (2 wheels);BSC/3in1;Wheelchair (measurements PT);Wheelchair cushion (measurements PT);Other (comment) (ramp)    Recommendations for Other Services       Precautions  / Restrictions Precautions Precautions: Fall Restrictions Weight Bearing Restrictions: Yes LLE Weight Bearing: Non weight bearing     Mobility  Bed Mobility Overal bed mobility: Needs Assistance Bed Mobility: Rolling, Supine to Sit Rolling: Supervision   Supine to sit: Supervision     General bed mobility comments: supervision for safety    Transfers Overall transfer level: Needs assistance Equipment used: Rolling walker (2 wheels) Transfers: Sit to/from Stand, Bed to chair/wheelchair/BSC Sit to Stand: Min assist Stand pivot transfers: Min assist        Lateral/Scoot Transfers: Min assist General transfer comment: minA to scoot laterally from elevated bed to R side, then completed repeated sit-stand transfers with heavy dependence on BUE support and minA to steady with transition from UE on armrests to RW.    Ambulation/Gait Ambulation/Gait assistance: Min assist Gait Distance (Feet): 4 Feet Assistive device: Rolling walker (2 wheels)   Gait velocity: decreased Gait velocity interpretation: <1.31 ft/sec, indicative of household ambulator   General Gait Details: hop-to pattern with minA to steady. pt limited by fatigue and pain in RLE with hopping     Balance Overall balance assessment: Needs assistance Sitting-balance support: No upper extremity supported, Feet supported Sitting balance-Leahy Scale: Good     Standing balance support: Bilateral upper extremity supported, Reliant on assistive device for balance Standing balance-Leahy Scale: Poor Standing balance comment: reliant on UE support                            Cognition Arousal/Alertness: Awake/alert Behavior During Therapy: WFL for tasks assessed/performed Overall Cognitive Status: Within Functional Limits for tasks assessed  Exercises Amputee Exercises Quad Sets: AROM, Both, 10 reps, Supine Hip Extension: AROM, Left, 10 reps,  Sidelying Hip ABduction/ADduction: AROM, Left, 10 reps, Sidelying Hip Flexion/Marching: AROM, Both, 10 reps, Supine    General Comments General comments (skin integrity, edema, etc.): VSS on RA      Pertinent Vitals/Pain Pain Assessment Pain Assessment: Faces Faces Pain Scale: Hurts little more Pain Location: L LE incision site Pain Descriptors / Indicators: Burning Pain Intervention(s): Limited activity within patient's tolerance, Monitored during session, Repositioned     PT Goals (current goals can now be found in the care plan section) Acute Rehab PT Goals Patient Stated Goal: to be independent PT Goal Formulation: With patient Time For Goal Achievement: 01/29/22 Potential to Achieve Goals: Good Progress towards PT goals: Progressing toward goals    Frequency    Min 3X/week      PT Plan Current plan remains appropriate       AM-PAC PT "6 Clicks" Mobility   Outcome Measure  Help needed turning from your back to your side while in a flat bed without using bedrails?: A Little Help needed moving from lying on your back to sitting on the side of a flat bed without using bedrails?: A Little Help needed moving to and from a bed to a chair (including a wheelchair)?: A Lot Help needed standing up from a chair using your arms (e.g., wheelchair or bedside chair)?: A Little Help needed to walk in hospital room?: Total Help needed climbing 3-5 steps with a railing? : Total 6 Click Score: 13    End of Session Equipment Utilized During Treatment: Gait belt Activity Tolerance: Patient tolerated treatment well Patient left: in bed;with call bell/phone within reach;with bed alarm set Nurse Communication: Mobility status PT Visit Diagnosis: Unsteadiness on feet (R26.81);Muscle weakness (generalized) (M62.81);Other abnormalities of gait and mobility (R26.89)     Time: 3016-0109 PT Time Calculation (min) (ACUTE ONLY): 27 min  Charges:  $Therapeutic Exercise: 8-22  mins $Therapeutic Activity: 8-22 mins                     West Carbo, PT, DPT   Acute Rehabilitation Department   Sandra Cockayne 01/16/2022, 4:46 PM

## 2022-01-16 NOTE — Hospital Course (Signed)
71 y.o. female with medical history significant of CAD s/p PCI to RCA (2015) and Lcx (2022), PAD with critical limb ischemia and right SFA stenting (10/2020), left SFA stenting and anterior tibial angioplasty (06/2021), HFrEF (EF 40 to 45% by echo 04/2021), atrial fibrillation, insulin dependent T2DM, severe septic arthritis presents for planned amputation of left BKA.has been followed for her osteomyelitis by ID for months, and decision has been made for BKA, she is on Plavix and Eliquis, her hemoglobin was noted to be low at 6.9, so she has been admitted for medical optimization before surgical intervention Patient underwent left BKA.  Postop doing well

## 2022-01-16 NOTE — Progress Notes (Signed)
  Inpatient Rehabilitation Admissions Coordinator   Met with patient at bedside for rehab assessment. We discussed goals and expectations of a possible CIR admit. She prefers CIR for rehab. Family can provide expected caregiver support that is recommended. Granddaughter, Delight Hoh and family to assist. We discussed need for ramp.Hopeful for CIR bed Friday or Saturday. Please call me with any questions.   Danne Baxter, RN, MSN Rehab Admissions Coordinator [phone]

## 2022-01-16 NOTE — Progress Notes (Signed)
PROGRESS NOTE Tammy Good  VFI:[id number] DOB: 19-Sep-1950 DOA: 01/13/2022 PCP: Default, Provider, MD   Brief Narrative/Hospital Course: 71 y.o. female with medical history significant of CAD s/p PCI to RCA (2015) and Lcx (2022), PAD with critical limb ischemia and right SFA stenting (10/2020), left SFA stenting and anterior tibial angioplasty (06/2021), HFrEF (EF 40 to 45% by echo 04/2021), atrial fibrillation, insulin dependent T2DM, severe septic arthritis presents for planned amputation of left BKA.has been followed for her osteomyelitis by ID for months, and decision has been made for BKA, she is on Plavix and Eliquis, her hemoglobin was noted to be low at 6.9, so she has been admitted for medical optimization before surgical intervention Patient underwent left BKA.  Postop doing well    Subjective: Seen and examined this morning pleasant, pain is controlled after getting hydrocodone  No new complaints Eliquis and Plavix have been held since 10/20  Assessment and Plan: Principal Problem:   Osteomyelitis ([place]) Active Problems:   Essential hypertension   Diabetes mellitus, type 2 ([place])   PAD (peripheral artery disease) ([facility])   Hypothyroidism   Normocytic anemia   CAD S/P percutaneous coronary angioplasty   Atrial fibrillation ([facility])   Osteomyelitis of left ankle  Severe septic arthritis of the talonavicular joint, calcaneocuboid joint w/ osteomyelitis: S/p lt BKA 10/24.  Postop doing well continue pain control, PT OT and CIR.  Antibiotic discontinued.  Plavix and Eliquis remains on hold due to anemia  Essential hypertension: Stable, cont prn  Hypothyroidism:Continue levothyroxine  PAD: S/P right SFA stenting (10/2020), left SFA stenting and anterior tibial angioplasty (06/2021) -Follows with [name] with vascular outpatient cont statin.  Diabetes mellitus, type 2: With uncontrolled hyperglycemia: Continue Farxiga, sliding scale insulin.  Blood sugar improving.   Recent Labs   Lab 01/15/22 1144 01/15/22 1648 01/15/22 2100 01/16/22 0800 01/16/22 1128  GLUCAP 238* 190* 226* 88 173*    Normocytic anemia hemoglobin hemoglobin baseline 9, 6.9 on admission S/P 1 unit > still low 7.2 g recheck in the morning.  Transfusing threshold 7 g Recent Labs  Lab 01/13/22 2046 01/14/22 0639 01/15/22 0709 01/16/22 0530  HGB 6.9* 7.4* 7.4* 7.2*  HCT 23.7* 24.8* 24.8* 24.7*        Atrial fibrillation:Hold Eliquis> hopefully can resume once hemoglobin stable CAD S/P percutaneous coronary angioplasty:CAD s/p PCI to RCA (2015) and Lcx (2022 On aspirin statin, Plavix on hold for anemia. Chronic hypotension on midodrine.  DVT prophylaxis: SCDs Start: 01/14/22 1631 SCDs Start: 01/13/22 1847 Code Status:   Code Status: Full Code Family Communication: plan of care discussed with patient at bedside. Patient status is: Inpatient because of osteomyelitis  Level of care: Telemetry Medical  Dispo: The patient is from: home            Anticipated disposition: CIRin  Mobility Assessment (last 72 hours)     Mobility Assessment     Row Name 01/15/22 1116 01/15/22 1048 01/15/22 0809 01/14/22 2039 01/14/22 0900   Does patient have an order for bedrest or is patient medically unstable -- -- No - Continue assessment No - Continue assessment No - Continue assessment   What is the highest level of mobility based on the progressive mobility assessment? Level 3 (Stands with assist) - Balance while standing  and cannot march in place Level 3 (Stands with assist) - Balance while standing  and cannot march in place Level 3 (Stands with assist) - Balance while standing  and cannot march in place Level 4 (  Walks with assist in room) - Balance while marching in place and cannot step forward and back - Complete Level 4 (Walks with assist in room) - Balance while marching in place and cannot step forward and back - Complete           Objective: Vitals last 24 hrs: Vitals:   01/15/22 2353  01/16/22 0425 01/16/22 0755 01/16/22 1355  BP: 133/83 (!) 138/55 (!) 117/52 133/62  Pulse: 69 68 63 63  Resp: '16 16 16 16  '$ Temp: 98.3 F (36.8 C) 98.1 F (36.7 C) 98 F (36.7 C) 98.7 F (37.1 C)  TempSrc: Oral Oral Oral Oral  SpO2: 90% 100% 100% 100%  Weight change:   Physical Examination: General exam: alert awake, older than stated age HEENT:Oral mucosa moist, Ear/Nose WNL grossly Respiratory system: bilaterally clear BS, no use of accessory muscle Cardiovascular system: S1 & S2 +, No JVD. Gastrointestinal system: Abdomen soft,NT,ND, BS+ Nervous System:Alert, awake, moving extremities. Extremities: LE edema neg, lt BKA stump with dressing in place and do not remove it Skin: No rashes,no icterus. MSK: Normal muscle bulk,tone, power  Medications reviewed:  Scheduled Meds:  sodium chloride   Intravenous Once   atorvastatin  40 mg Oral q1800   dapagliflozin propanediol  10 mg Oral q morning   dexamethasone  0.5 mg Oral Daily   docusate sodium  100 mg Oral BID   ferrous sulfate  325 mg Oral QHS   insulin aspart  0-15 Units Subcutaneous TID PC & HS   insulin glargine-yfgn  24 Units Subcutaneous Daily   levothyroxine  125 mcg Oral Q0600   midodrine  5 mg Oral BID WC   pantoprazole  40 mg Oral Daily  Continuous Infusions:  methocarbamol (ROBAXIN) IV      Diet Order             Diet heart healthy/carb modified Room service appropriate? Yes; Fluid consistency: Thin  Diet effective now                  Intake/Output Summary (Last 24 hours) at 01/16/2022 1400 Last data filed at 01/16/2022 0300 Gross per 24 hour  Intake --  Output 1200 ml  Net -1200 ml   Net IO Since Admission: 340 mL [01/16/22 1400]  Wt Readings from Last 3 Encounters:  01/08/22 86.4 kg  12/18/21 92.5 kg  11/28/21 90.5 kg     Unresulted Labs (From admission, onward)     Start     Ordered   01/13/22 2229  Occult blood card to lab, stool  Once,   R        01/13/22 2228          Data  Reviewed: I have personally reviewed following labs and imaging studies CBC: Recent Labs  Lab 01/13/22 2046 01/14/22 0639 01/15/22 0709 01/16/22 0530  WBC 7.0 6.3 9.2 8.5  HGB 6.9* 7.4* 7.4* 7.2*  HCT 23.7* 24.8* 24.8* 24.7*  MCV 88.8 88.9 87.9 88.8  PLT 485* 417* 391 856*   Basic Metabolic Panel: Recent Labs  Lab 01/13/22 2046 01/15/22 0709 01/16/22 0530  NA 139 137 139  K 3.8 3.7 3.5  CL 107 105 106  CO2 '23 24 25  '$ GLUCOSE 127* 237* 120*  BUN '9 10 13  '$ CREATININE 0.78 0.63 0.65  CALCIUM 8.3* 7.8* 8.3*  CBG: Recent Labs  Lab 01/15/22 1144 01/15/22 1648 01/15/22 2100 01/16/22 0800 01/16/22 1128  GLUCAP 238* 190* 226* 88 173*   Recent Results (  from the past 240 hour(s))  Surgical pcr screen     Status: Abnormal   Collection Time: 01/08/22  3:03 PM   Specimen: Nasal Mucosa; Nasal Swab  Result Value Ref Range Status   MRSA, PCR POSITIVE (A) NEGATIVE Final    Comment: RESULT CALLED TO, READ BACK BY AND VERIFIED WITH: RN LEANN HUDSON ON 01/08/22 @ 1738 BY DRT    Staphylococcus aureus POSITIVE (A) NEGATIVE Final    Comment: (NOTE) The Xpert SA Assay (FDA approved for NASAL specimens in patients 49 years of age and older), is one component of a comprehensive surveillance program. It is not intended to diagnose infection nor to guide or monitor treatment. Performed at [facility], [place] [address] [place], [place] [address]     Antimicrobials: Anti-infectives (From admission, onward)    Start     Dose/Rate Route Frequency Ordered Stop   01/15/22 0000  vancomycin (VANCOREADY) IVPB 1750 mg/350 mL  Status:  Discontinued        1,750 mg 175 mL/hr over 120 Minutes Intravenous Every 24 hours 01/13/22 2214 01/15/22 1923   01/14/22 2200  linezolid (ZYVOX) tablet 600 mg  Status:  Discontinued        600 mg Oral 2 times daily 01/14/22 1630 01/14/22 1759   01/14/22 1730  ceFAZolin (ANCEF) IVPB 2g/100 mL premix  Status:  Discontinued        2 g 200 mL/hr over 30  Minutes Intravenous Every 6 hours 01/14/22 1630 01/14/22 1639   01/13/22 2300  vancomycin (VANCOREADY) IVPB 2000 mg/400 mL        2,000 mg 200 mL/hr over 120 Minutes Intravenous  Once 01/13/22 2201 01/14/22 0144   01/13/22 2230  cefTRIAXone (ROCEPHIN) 2 g in sodium chloride 0.9 % 100 mL IVPB  Status:  Discontinued        2 g 200 mL/hr over 30 Minutes Intravenous Every 24 hours 01/13/22 2137 01/15/22 1923     Culture/Microbiology    Component Value Date/Time   SDES TISSUE 07/15/2021 1237   SPECREQUEST LEFT NAVICULAR SPEC C 07/15/2021 1237   CULT  07/15/2021 1237    No growth aerobically or anaerobically. Performed at [facility], [place] [address] [place], [place] [address]    REPTSTATUS 07/20/2021 FINAL 07/15/2021 1237  No results found.  LOS: 3 days  Antonieta Pert, MD [facility] 01/16/2022, 2:00 PM

## 2022-01-16 NOTE — PMR Pre-admission (Signed)
PMR Admission Coordinator Pre-Admission Assessment  Patient: Tammy Good is an 71 y.o., female MRN: [id number] DOB: 10/04/1950 Height: 5'7" Weight: 86.4 kg   Insurance Information HMO:     PPO:      PCP:      IPA:      80/20:      OTHER:  PRIMARY: Medicare a and b      Policy#: [id number]      Subscriber: pt Benefits:  Phone #: passport one source online     Name: 10/26 Eff. Date: a 06/22/2017 and b 05/22/2020     Deduct: $1600      Out of Pocket Max: none      Life Max: none CIR: 100%      SNF: 20 full days Outpatient: 80%     Co-Pay: 20% Home Health: 1005      Co-Pay: none DME: 80%     Co-Pay: 20% Providers: in network  SECONDARY: [place]      Policy#: [id number]  Financial Counselor:       Phone#:   The "Data Collection Information Summary" for patients in Inpatient Rehabilitation Facilities with attached "Privacy Act [place] Records" was provided and verbally reviewed with: Patient  Emergency Contact Information Contact Information     Name Relation Home Work Deming Daughter   [phone]   Lum Keas [phone]     Sabino Snipes [phone]     SIMMONS,CHRISTELLE Sister   [phone]   simmons,ebony Daughter   [phone]   Corena Pilgrim   [phone]      Current Medical History  Patient Admitting Diagnosis: L BKA  History of Present Illness: 71 year old female with history of hypothyroidism, CAD s/p PCI to RCA and Lcx, PAD with critical limb ischemia and right SFA stenting, left SFA stenting and anterior tibial angioplasty, HFrEF, atrial fib, IDDM severe septic arthritis. Presented on 01/13/22 for planned BKA.   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 Eliquis and Plavix held due to anemia. ID had followed for months for MRSA bacteremia, and several rounds of prolonged antibiotics.Continue hydralazine for HTN and levothyroxine for hypothyroidism. Continue Atorvastatin for PAD. Resumed  glargine, farxiga and SSI. To continue midodrine for hypotension.   HGB trending down. Transfused 1 unit. Plan transfusion with threshold 7g/dl.  Patient's medical record from [facility] has been reviewed by the rehabilitation admission coordinator and physician.  Past Medical History  Past Medical History:  Diagnosis Date   CAD S/P percutaneous coronary angioplasty 08/2013   100% mRCA - PCI Integrity Resolute DES 3.0 mm x 38 mm - 3.35 mm; PTCA of RPA V 2.0 mm x 15 mm   CHF (congestive heart failure) (HCC)    Cholesteatoma    right   Diabetes mellitus type 2 in obese (HCC)    On insulin and Invokana   History of osteomyelitis L 5th Toe all 05/2019   s/p Partial Ray Amputation with partial closure; 6 wks Abx & LSFA Atherectomy/DEB PTA with Stent for focal dissection.   Hyperlipidemia with target LDL less than 70    Hypothyroidism (acquired)    Mild essential hypertension    Obesity (BMI 30-39.9) 11/17/2013   PAD (peripheral artery disease) ([place]) 05/26/2019   05/26/19: Abd AoGram- BLE runoff -> L SFA orbital atherectomy - PTA w/ DEB & Stent 6 x 40 Luttonix (for focal dissection) - patent Pop A with 3 V runoff. LEA Dopplers 01/03/2020: RABI (prev) 0.68 (0.53)/ RTBI (  prev) 0.58 (0.33); LABI (prev) 0.80 (0.64), LTBI (prev) 0.64 (0.51); R mSFA ~50-74%, L mSFA 50-74%. Patent Prox SFA stent < 49% stenosis   Panhypopituitarism (HCC)    ST elevation myocardial infarction (STEMI) of inferior wall, subsequent episode of care ([place]) 08/2013   80% branch of D1, 40% mid AV groove circumflex, 100% RCA with subacute thrombus -- thrombus extending into RPA V with 100% occlusion after initial angioplasty of mid RCA ;; Post MI ECHO 6/9/'15: EF 50-55%, mild LVH with moderate HK of inferior wall, Gr1 DD, mild LA dilation; mildly reduced RV function   Has the patient had major surgery during 100 days prior to admission? Yes  Family History   family history includes Alzheimer's disease in her maternal  grandmother; Cancer in her sister; Cancer (age of onset: 38) in her mother; Heart attack (age of onset: 61) in her father.  Current Medications  Current Facility-Administered Medications:    0.9 %  sodium chloride infusion (Manually program via Guardrails IV Fluids), , Intravenous, Once, Martin[place], Jesse J, PA-C   acetaminophen (TYLENOL) tablet 325-650 mg, 325-650 mg, Oral, Q6H PRN, [place], Jesse J, PA-C, 650 mg at 01/15/22 2147   apixaban (ELIQUIS) tablet 5 mg, 5 mg, Oral, BID, Kc, Ramesh, MD, 5 mg at 01/17/22 1023   atorvastatin (LIPITOR) tablet 40 mg, 40 mg, Oral, q1800, [place], Jesse J, PA-C, 40 mg at 01/16/22 1740   clopidogrel (PLAVIX) tablet 75 mg, 75 mg, Oral, Daily, Kc, Ramesh, MD, 75 mg at 01/17/22 1023   dapagliflozin propanediol (FARXIGA) tablet 10 mg, 10 mg, Oral, q morning, [place], Jesse J, PA-C, 10 mg at 01/17/22 0800   dexamethasone (DECADRON) tablet 0.5 mg, 0.5 mg, Oral, Daily, [place], Jesse J, PA-C, 0.5 mg at 01/17/22 0800   diphenhydrAMINE (BENADRYL) 12.5 MG/5ML elixir 12.5-25 mg, 12.5-25 mg, Oral, Q4H PRN, [place], Jesse J, PA-C   docusate sodium (COLACE) capsule 100 mg, 100 mg, Oral, BID, Martin[place], Jesse J, PA-C, 100 mg at 01/17/22 0801   ferrous sulfate tablet 325 mg, 325 mg, Oral, QHS, [place], Jesse J, PA-C, 325 mg at 01/16/22 2125   furosemide (LASIX) tablet 20 mg, 20 mg, Oral, Daily PRN, Martin[place], Jesse J, PA-C   hydrALAZINE (APRESOLINE) injection 5 mg, 5 mg, Intravenous, Q4H PRN, [place], Jesse J, PA-C   HYDROcodone-acetaminophen (NORCO) 7.5-325 MG per tablet 1-2 tablet, 1-2 tablet, Oral, Q4H PRN, [place], Jesse J, PA-C, 1 tablet at 01/16/22 2125   HYDROcodone-acetaminophen (NORCO/VICODIN) 5-325 MG per tablet 1-2 tablet, 1-2 tablet, Oral, Q4H PRN, [place], Jesse J, PA-C, 1 tablet at 01/17/22 0800   insulin aspart (novoLOG) injection 0-15 Units, 0-15 Units, Subcutaneous, TID PC & HS, Martinique, Jesse J, PA-C, 3 Units at 01/16/22 1740   insulin glargine-yfgn (SEMGLEE) injection 24  Units, 24 Units, Subcutaneous, Daily, Danford, Suann Larry, MD, 24 Units at 01/16/22 2100   levothyroxine (SYNTHROID) tablet 125 mcg, 125 mcg, Oral, Q0600, [place], Jesse J, PA-C, 125 mcg at 01/17/22 7121   methocarbamol (ROBAXIN) tablet 500 mg, 500 mg, Oral, Q6H PRN, 500 mg at 01/15/22 0240 **OR** methocarbamol (ROBAXIN) 500 mg in dextrose 5 % 50 mL IVPB, 500 mg, Intravenous, Q6H PRN, [place], Jesse J, PA-C   metoCLOPramide (REGLAN) tablet 5-10 mg, 5-10 mg, Oral, Q8H PRN **OR** metoCLOPramide (REGLAN) injection 5-10 mg, 5-10 mg, Intravenous, Q8H PRN, [place], Jesse J, PA-C   midodrine (PROAMATINE) tablet 5 mg, 5 mg, Oral, BID WC, Martin[place], Jesse J, PA-C, 5 mg at 01/17/22 0800   morphine (PF) 2 MG/ML injection 0.5-1  mg, 0.5-1 mg, Intravenous, Q2H PRN, [place], Jesse J, PA-C   nitroGLYCERIN (NITROSTAT) SL tablet 0.4 mg, 0.4 mg, Sublingual, Q5 Min x 3 PRN, [place], Jesse J, PA-C   nystatin cream (MYCOSTATIN) 1 Application, 1 Application, Topical, BID PRN, [place], Jesse J, PA-C   ondansetron [facility]) tablet 4 mg, 4 mg, Oral, Q6H PRN **OR** ondansetron (ZOFRAN) injection 4 mg, 4 mg, Intravenous, Q6H PRN, [place], Jesse J, PA-C   pantoprazole (PROTONIX) EC tablet 40 mg, 40 mg, Oral, Daily, [place], Jesse J, PA-C, 40 mg at 01/17/22 0800   polyethylene glycol (MIRALAX / GLYCOLAX) packet 17 g, 17 g, Oral, Daily PRN, [place], Jesse J, PA-C  Patients Current Diet:  Diet Order             Diet heart healthy/carb modified Room service appropriate? Yes; Fluid consistency: Thin  Diet effective now                  Precautions / Restrictions Precautions Precautions: Fall Restrictions Weight Bearing Restrictions: Yes LLE Weight Bearing: Non weight bearing   Has the patient had 2 or more falls or a fall with injury in the past year? No  Prior Activity Level Limited Community (1-2x/wk): mod I with cane, driving, retired  Prior Functional Level Self Care: Did the patient need help bathing, dressing, using  the toilet or eating? Independent  Indoor Mobility: Did the patient need assistance with walking from room to room (with or without device)? Independent  Stairs: Did the patient need assistance with internal or external stairs (with or without device)? Independent  Functional Cognition: Did the patient need help planning regular tasks such as shopping or remembering to take medications? Independent  Patient Information Are you of Hispanic, Latino/a,or Spanish origin?: A. No, not of Hispanic, Latino/a, or Spanish origin What is your race?: B. Black or African American Do you need or want an interpreter to communicate with a doctor or health care staff?: 0. No  Patient's Response To:  Health Literacy and Transportation Is the patient able to respond to health literacy and transportation needs?: Yes Health Literacy - How often do you need to have someone help you when you read instructions, pamphlets, or other written material from your doctor or pharmacy?: Never In the past 12 months, has lack of transportation kept you from medical appointments or from getting medications?: No In the past 12 months, has lack of transportation kept you from meetings, work, or from getting things needed for daily living?: No  Development worker, international aid / Equipment Home Equipment: Shower seat, Radio producer - single point, Conservation officer, nature (2 wheels), Hand held shower head, Toilet riser, BSC/3in1  Prior Device Use: Indicate devices/aids used by the patient prior to current illness, exacerbation or injury?  cane  Current Functional Level Cognition  Overall Cognitive Status: Within Functional Limits for tasks assessed Orientation Level: Oriented X4    Extremity Assessment (includes Sensation/Coordination)  Upper Extremity Assessment: Overall WFL for tasks assessed  Lower Extremity Assessment: Defer to PT evaluation    ADLs  Overall ADL's : Needs assistance/impaired Eating/Feeding: Independent, Bed level Grooming:  Wash/dry hands, Wash/dry face, Oral care, Sitting, Supervision/safety Upper Body Bathing: Supervision/ safety, Sitting Lower Body Bathing: Minimal assistance, Bed level, Moderate assistance Upper Body Dressing : Min guard, Sitting Lower Body Dressing: Moderate assistance, Bed level, Minimal assistance Toilet Transfer: Minimal assistance, +2 for safety/equipment, Stand-pivot Toileting- Clothing Manipulation and Hygiene: Minimal assistance, +2 for safety/equipment, Sit to/from stand    Mobility  Overal bed mobility: Needs  Assistance Bed Mobility: Rolling, Supine to Sit Rolling: Supervision Supine to sit: Supervision General bed mobility comments: supervision for safety    Transfers  Overall transfer level: Needs assistance Equipment used: Rolling walker (2 wheels) Transfers: Sit to/from Stand, Bed to chair/wheelchair/BSC Sit to Stand: Min assist Bed to/from chair/wheelchair/BSC transfer type:: Lateral/scoot transfer Stand pivot transfers: Min assist  Lateral/Scoot Transfers: Min assist General transfer comment: minA to scoot laterally from elevated bed to R side, then completed repeated sit-stand transfers with heavy dependence on BUE support and minA to steady with transition from UE on armrests to RW.    Ambulation / Gait / Stairs / Wheelchair Mobility  Ambulation/Gait Ambulation/Gait assistance: Herbalist (Feet): 4 Feet Assistive device: Rolling walker (2 wheels) General Gait Details: hop-to pattern with minA to steady. pt limited by fatigue and pain in RLE with hopping Gait velocity: decreased Gait velocity interpretation: <1.31 ft/sec, indicative of household ambulator    Posture / Balance Balance Overall balance assessment: Needs assistance Sitting-balance support: No upper extremity supported, Feet supported Sitting balance-Leahy Scale: Good Standing balance support: Bilateral upper extremity supported, Reliant on assistive device for balance Standing  balance-Leahy Scale: Poor Standing balance comment: reliant on UE support    Special needs/care consideration Wound vac postoperative   Previous Home Environment  Living Arrangements: Alone  Lives With: Alone Available Help at Discharge:  (granddaughter, Delight Hoh, moving in . alot of family to asisst) Type of Home: House Home Layout: One level Home Access: Stairs to enter Entrance Stairs-Rails: Left Entrance Stairs-Number of Steps: 3-4 at the front, 4 through the garage (typical entrance) Bathroom Shower/Tub: Multimedia programmer: Standard Bathroom Accessibility: No Home Care Services: No Additional Comments: pt unable to get off toilet by herself with RW in front of her.  Discharge Living Setting Plans for Discharge Living Setting: Patient's home Delight Hoh, granddaughter to move in) Type of Home at Discharge: House Discharge Home Layout: One level Discharge Home Access: Stairs to enter Entrance Stairs-Rails: Left Entrance Stairs-Number of Steps: 3-4 in front and 4 in back. looking into ramp Discharge Bathroom Shower/Tub: Walk-in shower Discharge Bathroom Toilet: Standard Discharge Bathroom Accessibility: Yes How Accessible: Accessible via walker Does the patient have any problems obtaining your medications?: No  Social/Family/Support Systems Contact Information: daughter Kristen Loader and Granddaughter, Delight Hoh Anticipated Caregiver: multiple family members Anticipated Caregiver's Contact Information: see contacts Ability/Limitations of Caregiver: Lazaria at school for OT but other family can asisst also Caregiver Availability: Intermittent Discharge Plan Discussed with Primary Caregiver: Yes Is Caregiver In Agreement with Plan?: Yes Does Caregiver/Family have Issues with Lodging/Transportation while Pt is in Rehab?: Yes  Goals Patient/Family Goal for Rehab: Mod I to intermittent supervision with PT and OT Expected length of stay: ELOS 7 to 10 days Pt/Family Agrees to  Admission and willing to participate: Yes Program Orientation Provided & Reviewed with Pt/Caregiver Including Roles  & Responsibilities: Yes  Decrease burden of Care through IP rehab admission: n/a  Possible need for SNF placement upon discharge: not anticipated  Patient Condition: I have reviewed medical records from [facility], spoken with CM, and patient. I met with patient at the bedside for inpatient rehabilitation assessment.  Patient will benefit from ongoing PT and OT, can actively participate in 3 hours of therapy a day 5 days of the week, and can make measurable gains during the admission.  Patient will also benefit from the coordinated team approach during an Inpatient Acute Rehabilitation admission.  The patient will receive intensive therapy as well  as Rehabilitation physician, nursing, social worker, and care management interventions.  Due to bladder management, bowel management, safety, skin/wound care, disease management, medication administration, pain management, and patient education the patient requires 24 hour a day rehabilitation nursing.  The patient is currently min asssit with mobility and min to mod assist with basic ADLs.  Discharge setting and therapy post discharge at home with home health is anticipated.  Patient has agreed to participate in the Acute Inpatient Rehabilitation Program and will admit today.  Preadmission Screen Completed By: Danne Baxter RN MSN with updates by  Retta Diones, 01/17/2022 11:18 AM ______________________________________________________________________   Discussed status with Dr. Curlene Dolphin on 01/17/22 at 0945 and received approval for admission today.  Admission Coordinator:  Danne Baxter RN MSN with updates by Retta Diones, RN, time 1117/Date 01/17/22   Assessment/Plan: Diagnosis: L BKA Does the need for close, 24 hr/day Medical supervision in concert with the patient's rehab needs make it unreasonable for this patient  to be served in a less intensive setting? Yes Co-Morbidities requiring supervision/potential complications: Hypothyroidism, PAD, DM2, Anemia, Afib, CAD, hypotension Due to bladder management, bowel management, safety, skin/wound care, disease management, medication administration, pain management, and patient education, does the patient require 24 hr/day rehab nursing? Yes Does the patient require coordinated care of a physician, rehab nurse, PT, OT, and SLP to address physical and functional deficits in the context of the above medical diagnosis(es)? Yes Addressing deficits in the following areas: balance, endurance, locomotion, strength, transferring, bowel/bladder control, bathing, dressing, feeding, grooming, toileting, and psychosocial support Can the patient actively participate in an intensive therapy program of at least 3 hrs of therapy 5 days a week? Yes The potential for patient to make measurable gains while on inpatient rehab is excellent Anticipated functional outcomes upon discharge from inpatient rehab: modified independent and supervision PT, modified independent and supervision OT, n/a SLP Estimated rehab length of stay to reach the above functional goals is: 7-10 days Anticipated discharge destination: Home 10. Overall Rehab/Functional Prognosis: excellent   MD Signature: Jennye Boroughs

## 2022-01-17 ENCOUNTER — Other Ambulatory Visit: Payer: Self-pay

## 2022-01-17 ENCOUNTER — Inpatient Hospital Stay (HOSPITAL_COMMUNITY)
Admission: RE | Admit: 2022-01-17 | Discharge: 2022-01-31 | DRG: 560 | Disposition: A | Discharge status: Home Health | Payer: Medicare Other | Source: Intra-hospital | Attending: Physical Medicine & Rehabilitation | Admitting: Physical Medicine & Rehabilitation

## 2022-01-17 ENCOUNTER — Encounter (HOSPITAL_COMMUNITY): Payer: Self-pay

## 2022-01-17 DIAGNOSIS — E1143 Type 2 diabetes mellitus with diabetic autonomic (poly)neuropathy: Secondary | ICD-10-CM | POA: Diagnosis present

## 2022-01-17 DIAGNOSIS — M25562 Pain in left knee: Secondary | ICD-10-CM | POA: Diagnosis present

## 2022-01-17 DIAGNOSIS — Z807 Family history of other malignant neoplasms of lymphoid, hematopoietic and related tissues: Secondary | ICD-10-CM

## 2022-01-17 DIAGNOSIS — I2510 Atherosclerotic heart disease of native coronary artery without angina pectoris: Secondary | ICD-10-CM | POA: Diagnosis present

## 2022-01-17 DIAGNOSIS — Z79890 Hormone replacement therapy (postmenopausal): Secondary | ICD-10-CM | POA: Diagnosis not present

## 2022-01-17 DIAGNOSIS — Z4781 Encounter for orthopedic aftercare following surgical amputation: Secondary | ICD-10-CM | POA: Diagnosis present

## 2022-01-17 DIAGNOSIS — S88112A Complete traumatic amputation at level between knee and ankle, left lower leg, initial encounter: Secondary | ICD-10-CM | POA: Diagnosis present

## 2022-01-17 DIAGNOSIS — K5900 Constipation, unspecified: Secondary | ICD-10-CM | POA: Diagnosis present

## 2022-01-17 DIAGNOSIS — I25810 Atherosclerosis of coronary artery bypass graft(s) without angina pectoris: Secondary | ICD-10-CM | POA: Diagnosis not present

## 2022-01-17 DIAGNOSIS — D62 Acute posthemorrhagic anemia: Secondary | ICD-10-CM | POA: Diagnosis present

## 2022-01-17 DIAGNOSIS — E119 Type 2 diabetes mellitus without complications: Secondary | ICD-10-CM | POA: Diagnosis not present

## 2022-01-17 DIAGNOSIS — Z7902 Long term (current) use of antithrombotics/antiplatelets: Secondary | ICD-10-CM

## 2022-01-17 DIAGNOSIS — D75839 Thrombocytosis, unspecified: Secondary | ICD-10-CM | POA: Diagnosis not present

## 2022-01-17 DIAGNOSIS — Z794 Long term (current) use of insulin: Secondary | ICD-10-CM | POA: Diagnosis not present

## 2022-01-17 DIAGNOSIS — R3915 Urgency of urination: Secondary | ICD-10-CM | POA: Diagnosis not present

## 2022-01-17 DIAGNOSIS — I959 Hypotension, unspecified: Secondary | ICD-10-CM | POA: Diagnosis present

## 2022-01-17 DIAGNOSIS — E11621 Type 2 diabetes mellitus with foot ulcer: Secondary | ICD-10-CM | POA: Diagnosis not present

## 2022-01-17 DIAGNOSIS — Z820 Family history of epilepsy and other diseases of the nervous system: Secondary | ICD-10-CM | POA: Diagnosis not present

## 2022-01-17 DIAGNOSIS — M25561 Pain in right knee: Secondary | ICD-10-CM | POA: Diagnosis present

## 2022-01-17 DIAGNOSIS — I4891 Unspecified atrial fibrillation: Secondary | ICD-10-CM | POA: Diagnosis present

## 2022-01-17 DIAGNOSIS — Z87891 Personal history of nicotine dependence: Secondary | ICD-10-CM

## 2022-01-17 DIAGNOSIS — Z89512 Acquired absence of left leg below knee: Secondary | ICD-10-CM | POA: Diagnosis not present

## 2022-01-17 DIAGNOSIS — Z8249 Family history of ischemic heart disease and other diseases of the circulatory system: Secondary | ICD-10-CM | POA: Diagnosis not present

## 2022-01-17 DIAGNOSIS — K5903 Drug induced constipation: Secondary | ICD-10-CM

## 2022-01-17 DIAGNOSIS — E039 Hypothyroidism, unspecified: Secondary | ICD-10-CM | POA: Diagnosis present

## 2022-01-17 DIAGNOSIS — I9589 Other hypotension: Secondary | ICD-10-CM | POA: Diagnosis not present

## 2022-01-17 DIAGNOSIS — I739 Peripheral vascular disease, unspecified: Secondary | ICD-10-CM | POA: Diagnosis not present

## 2022-01-17 DIAGNOSIS — Z7901 Long term (current) use of anticoagulants: Secondary | ICD-10-CM | POA: Diagnosis not present

## 2022-01-17 DIAGNOSIS — R351 Nocturia: Secondary | ICD-10-CM | POA: Insufficient documentation

## 2022-01-17 DIAGNOSIS — D638 Anemia in other chronic diseases classified elsewhere: Secondary | ICD-10-CM | POA: Diagnosis present

## 2022-01-17 DIAGNOSIS — S88112S Complete traumatic amputation at level between knee and ankle, left lower leg, sequela: Secondary | ICD-10-CM | POA: Diagnosis not present

## 2022-01-17 DIAGNOSIS — R350 Frequency of micturition: Secondary | ICD-10-CM | POA: Diagnosis present

## 2022-01-17 DIAGNOSIS — M86672 Other chronic osteomyelitis, left ankle and foot: Secondary | ICD-10-CM | POA: Diagnosis not present

## 2022-01-17 DIAGNOSIS — E11649 Type 2 diabetes mellitus with hypoglycemia without coma: Secondary | ICD-10-CM | POA: Diagnosis present

## 2022-01-17 LAB — CBC
HCT: 26.6 % — ABNORMAL LOW (ref 36.0–46.0)
Hemoglobin: 8 g/dL — ABNORMAL LOW (ref 12.0–15.0)
MCH: 26.8 pg (ref 26.0–34.0)
MCHC: 30.1 g/dL (ref 30.0–36.0)
MCV: 89 fL (ref 80.0–100.0)
Platelets: 465 10*3/uL — ABNORMAL HIGH (ref 150–400)
RBC: 2.99 MIL/uL — ABNORMAL LOW (ref 3.87–5.11)
RDW: 21.1 % — ABNORMAL HIGH (ref 11.5–15.5)
WBC: 7.6 10*3/uL (ref 4.0–10.5)
nRBC: 0 % (ref 0.0–0.2)

## 2022-01-17 LAB — BASIC METABOLIC PANEL
Anion gap: 6 (ref 5–15)
BUN: 13 mg/dL (ref 8–23)
CO2: 27 mmol/L (ref 22–32)
Calcium: 8.4 mg/dL — ABNORMAL LOW (ref 8.9–10.3)
Chloride: 107 mmol/L (ref 98–111)
Creatinine, Ser: 0.7 mg/dL (ref 0.44–1.00)
GFR, Estimated: >60 mL/min (ref >60)
Glucose, Bld: 102 mg/dL — ABNORMAL HIGH (ref 70–99)
Potassium: 3.9 mmol/L (ref 3.5–5.1)
Sodium: 140 mmol/L (ref 135–145)

## 2022-01-17 LAB — GLUCOSE, CAPILLARY
Glucose-Capillary: 87 mg/dL (ref 70–99)
Glucose-Capillary: 170 mg/dL — ABNORMAL HIGH (ref 70–99)
Glucose-Capillary: 158 mg/dL — ABNORMAL HIGH (ref 70–99)
Glucose-Capillary: 145 mg/dL — ABNORMAL HIGH (ref 70–99)
Glucose-Capillary: 207 mg/dL — ABNORMAL HIGH (ref 70–99)

## 2022-01-17 MED ORDER — FUROSEMIDE 20 MG PO TABS: 20.0000 mg | ORAL_TABLET | Freq: Every day | ORAL | Status: DC | PRN

## 2022-01-17 MED ORDER — JUVEN PO PACK
1.0000 | PACK | Freq: Two times a day (BID) | ORAL | Status: DC
  Administered 2022-01-17 – 2022-01-31 (×26): 1 via ORAL
  Filled 2022-01-17 (×26): qty 1

## 2022-01-17 MED ORDER — NITROGLYCERIN 0.4 MG SL SUBL: 0.4000 mg | SUBLINGUAL_TABLET | SUBLINGUAL | Status: DC | PRN

## 2022-01-17 MED ORDER — PROCHLORPERAZINE EDISYLATE 10 MG/2ML IJ SOLN: 5.0000 mg | Freq: Four times a day (QID) | INTRAMUSCULAR | Status: DC | PRN

## 2022-01-17 MED ORDER — DOCUSATE SODIUM 100 MG PO CAPS
100.0000 mg | ORAL_CAPSULE | Freq: Two times a day (BID) | ORAL | Status: DC
  Administered 2022-01-17 – 2022-01-30 (×27): 100 mg via ORAL
  Filled 2022-01-17 (×28): qty 1

## 2022-01-17 MED ORDER — ALUM & MAG HYDROXIDE-SIMETH 200-200-20 MG/5ML PO SUSP: 30.0000 mL | ORAL | Status: DC | PRN

## 2022-01-17 MED ORDER — PROCHLORPERAZINE MALEATE 5 MG PO TABS: 5.0000 mg | ORAL_TABLET | Freq: Four times a day (QID) | ORAL | Status: DC | PRN

## 2022-01-17 MED ORDER — TRAZODONE HCL 50 MG PO TABS: 25.0000 mg | ORAL_TABLET | Freq: Every evening | ORAL | Status: DC | PRN

## 2022-01-17 MED ORDER — LEVOTHYROXINE SODIUM 25 MCG PO TABS
125.0000 ug | ORAL_TABLET | Freq: Every day | ORAL | Status: DC
  Administered 2022-01-18 – 2022-01-31 (×14): 125 ug via ORAL
  Filled 2022-01-17 (×14): qty 1

## 2022-01-17 MED ORDER — CLOPIDOGREL BISULFATE 75 MG PO TABS
75.0000 mg | ORAL_TABLET | Freq: Every day | ORAL | Status: DC
  Administered 2022-01-18 – 2022-01-31 (×14): 75 mg via ORAL
  Filled 2022-01-17 (×14): qty 1

## 2022-01-17 MED ORDER — GUAIFENESIN-DM 100-10 MG/5ML PO SYRP: 5.0000 mL | ORAL_SOLUTION | Freq: Four times a day (QID) | ORAL | Status: DC | PRN

## 2022-01-17 MED ORDER — CLOPIDOGREL BISULFATE 75 MG PO TABS
75.0000 mg | ORAL_TABLET | Freq: Every day | ORAL | Status: DC
  Administered 2022-01-17: 75 mg via ORAL
  Filled 2022-01-17: qty 1

## 2022-01-17 MED ORDER — FERROUS SULFATE 325 (65 FE) MG PO TABS
325.0000 mg | ORAL_TABLET | Freq: Every day | ORAL | Status: DC
  Administered 2022-01-17 – 2022-01-30 (×13): 325 mg via ORAL
  Filled 2022-01-17 (×13): qty 1

## 2022-01-17 MED ORDER — DEXAMETHASONE 0.5 MG PO TABS
0.5000 mg | ORAL_TABLET | Freq: Every day | ORAL | Status: DC
  Administered 2022-01-18 – 2022-01-31 (×14): 0.5 mg via ORAL
  Filled 2022-01-17 (×14): qty 1

## 2022-01-17 MED ORDER — LUBRIDERM SERIOUSLY SENSITIVE EX LOTN
TOPICAL_LOTION | Freq: Two times a day (BID) | CUTANEOUS | Status: DC
  Filled 2022-01-17: qty 473

## 2022-01-17 MED ORDER — DAPAGLIFLOZIN PROPANEDIOL 10 MG PO TABS
10.0000 mg | ORAL_TABLET | Freq: Every morning | ORAL | Status: DC
  Administered 2022-01-18 – 2022-01-31 (×14): 10 mg via ORAL
  Filled 2022-01-17 (×14): qty 1

## 2022-01-17 MED ORDER — ACETAMINOPHEN 325 MG PO TABS
325.0000 mg | ORAL_TABLET | ORAL | Status: DC | PRN
  Administered 2022-01-18 – 2022-01-30 (×20): 650 mg via ORAL
  Filled 2022-01-17 (×22): qty 2

## 2022-01-17 MED ORDER — BENEPROTEIN PO POWD
1.0000 | Freq: Three times a day (TID) | ORAL | Status: DC
  Administered 2022-01-17 – 2022-01-20 (×7): 6 g via ORAL
  Filled 2022-01-17: qty 227

## 2022-01-17 MED ORDER — APIXABAN 5 MG PO TABS
5.0000 mg | ORAL_TABLET | Freq: Two times a day (BID) | ORAL | Status: DC
  Administered 2022-01-17: 5 mg via ORAL
  Filled 2022-01-17: qty 1

## 2022-01-17 MED ORDER — INSULIN ASPART 100 UNIT/ML IJ SOLN
0.0000 [IU] | Freq: Every day | INTRAMUSCULAR | Status: DC
  Administered 2022-01-17 – 2022-01-27 (×3): 2 [IU] via SUBCUTANEOUS

## 2022-01-17 MED ORDER — INSULIN GLARGINE-YFGN 100 UNIT/ML ~~LOC~~ SOLN
22.0000 [IU] | Freq: Every day | SUBCUTANEOUS | Status: DC
  Administered 2022-01-17 – 2022-01-23 (×7): 22 [IU] via SUBCUTANEOUS
  Filled 2022-01-17 (×8): qty 0.22

## 2022-01-17 MED ORDER — ATORVASTATIN CALCIUM 40 MG PO TABS
40.0000 mg | ORAL_TABLET | Freq: Every day | ORAL | Status: DC
  Administered 2022-01-17 – 2022-01-30 (×14): 40 mg via ORAL
  Filled 2022-01-17 (×14): qty 1

## 2022-01-17 MED ORDER — MIDODRINE HCL 5 MG PO TABS
5.0000 mg | ORAL_TABLET | Freq: Two times a day (BID) | ORAL | Status: DC
  Administered 2022-01-18 – 2022-01-31 (×27): 5 mg via ORAL
  Filled 2022-01-17 (×27): qty 1

## 2022-01-17 MED ORDER — INSULIN ASPART 100 UNIT/ML IJ SOLN
0.0000 [IU] | Freq: Three times a day (TID) | INTRAMUSCULAR | Status: DC
  Administered 2022-01-17: 1 [IU] via SUBCUTANEOUS
  Administered 2022-01-18: 2 [IU] via SUBCUTANEOUS
  Administered 2022-01-18 – 2022-01-21 (×8): 1 [IU] via SUBCUTANEOUS
  Administered 2022-01-22: 2 [IU] via SUBCUTANEOUS
  Administered 2022-01-22: 3 [IU] via SUBCUTANEOUS
  Administered 2022-01-23: 1 [IU] via SUBCUTANEOUS
  Administered 2022-01-23: 3 [IU] via SUBCUTANEOUS
  Administered 2022-01-23 – 2022-01-24 (×3): 2 [IU] via SUBCUTANEOUS
  Administered 2022-01-24: 6 [IU] via SUBCUTANEOUS
  Administered 2022-01-25 (×2): 2 [IU] via SUBCUTANEOUS
  Administered 2022-01-25: 1 [IU] via SUBCUTANEOUS
  Administered 2022-01-26: 2 [IU] via SUBCUTANEOUS
  Administered 2022-01-26: 1 [IU] via SUBCUTANEOUS
  Administered 2022-01-26: 5 [IU] via SUBCUTANEOUS
  Administered 2022-01-27: 2 [IU] via SUBCUTANEOUS
  Administered 2022-01-28: 1 [IU] via SUBCUTANEOUS
  Administered 2022-01-28: 2 [IU] via SUBCUTANEOUS
  Administered 2022-01-28: 1 [IU] via SUBCUTANEOUS
  Administered 2022-01-29 – 2022-01-30 (×3): 2 [IU] via SUBCUTANEOUS

## 2022-01-17 MED ORDER — APIXABAN 5 MG PO TABS
5.0000 mg | ORAL_TABLET | Freq: Two times a day (BID) | ORAL | Status: DC
  Administered 2022-01-17 – 2022-01-31 (×28): 5 mg via ORAL
  Filled 2022-01-17 (×28): qty 1

## 2022-01-17 MED ORDER — SORBITOL 70 % SOLN
60.0000 mL | Freq: Once | Status: AC
  Administered 2022-01-17: 60 mL via ORAL
  Filled 2022-01-17: qty 60

## 2022-01-17 MED ORDER — FLEET ENEMA 7-19 GM/118ML RE ENEM: 1.0000 | ENEMA | Freq: Once | RECTAL | Status: DC | PRN

## 2022-01-17 MED ORDER — POLYETHYLENE GLYCOL 3350 17 G PO PACK: 17.0000 g | PACK | Freq: Every day | ORAL | Status: DC | PRN

## 2022-01-17 MED ORDER — DIPHENHYDRAMINE HCL 12.5 MG/5ML PO ELIX: 12.5000 mg | ORAL_SOLUTION | Freq: Four times a day (QID) | ORAL | Status: DC | PRN

## 2022-01-17 MED ORDER — CETAPHIL MOISTURIZING EX LOTN
TOPICAL_LOTION | Freq: Two times a day (BID) | CUTANEOUS | Status: DC
  Filled 2022-01-17 (×2): qty 473

## 2022-01-17 MED ORDER — PROCHLORPERAZINE 25 MG RE SUPP: 12.5000 mg | Freq: Four times a day (QID) | RECTAL | Status: DC | PRN

## 2022-01-17 MED ORDER — NYSTATIN 100000 UNIT/GM EX CREA: 1.0000 | TOPICAL_CREAM | Freq: Two times a day (BID) | CUTANEOUS | Status: DC | PRN

## 2022-01-17 MED ORDER — BISACODYL 10 MG RE SUPP: 10.0000 mg | Freq: Every day | RECTAL | Status: DC | PRN

## 2022-01-17 MED ORDER — PANTOPRAZOLE SODIUM 40 MG PO TBEC
40.0000 mg | DELAYED_RELEASE_TABLET | Freq: Every day | ORAL | Status: DC
  Administered 2022-01-18 – 2022-01-31 (×14): 40 mg via ORAL
  Filled 2022-01-17 (×14): qty 1

## 2022-01-17 MED ORDER — HYDRALAZINE HCL 20 MG/ML IJ SOLN: 5.0000 mg | INTRAMUSCULAR | Status: DC | PRN

## 2022-01-17 NOTE — Progress Notes (Signed)
Pt arrived to unit at 1545. Patient in no distress, VSS. Went over personal belonging policy, pt has cellphone at bedside. Two bags that were already put up in the closet. Patient noted to have some redness and skin breakdown noted to peri area and coccyx, picture taken on charge phone and placed in chart. Skin assessment complete with [place]. Patient has left BKA, with bandage, intact. No concerns noted. Family at bedside.

## 2022-01-17 NOTE — Progress Notes (Signed)
PROGRESS NOTE Tammy Good  FAO:[id number] DOB: April 17, 1950 DOA: 01/13/2022 PCP: Default, Provider, MD   Brief Narrative/Hospital Course: 71 y.o. female with medical history significant of CAD s/p PCI to RCA (2015) and Lcx (2022), PAD with critical limb ischemia and right SFA stenting (10/2020), left SFA stenting and anterior tibial angioplasty (06/2021), HFrEF (EF 40 to 45% by echo 04/2021), atrial fibrillation, insulin dependent T2DM, severe septic arthritis presents for planned amputation of left BKA.has been followed for her osteomyelitis by ID for months, and decision has been made for BKA, she is on Plavix and Eliquis, her hemoglobin was noted to be low at 6.9, so she has been admitted for medical optimization before surgical intervention Patient underwent left BKA.  Postop doing well  Hemoglobin holding stable at 8 g, PT OT has evaluated, planning for CIR once bed available Subjective:  Seen and examined.  Pain is well controlled has no new complaints. Overnight afebrile, hemoglobin uptrending. Left BKA stump wound VAC is out.  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  Postop doing well continue pain control, PT OT and CIR placement once bed available.  Discussed with orthopedics, okay to resume Eliquis and Plavix, patient is also agreeable.  We will need to closely watch for hemoglobin. NWB LLE  Essential hypertension:Controlled, cont prn  Hypothyroidism:Continue levothyroxine  PAD: S/P right SFA stenting (10/2020), left SFA stenting and anterior tibial angioplasty (06/2021).Follows with [name]  with vascular outpatient cont statin.  Diabetes mellitus, type 2 w/ uncontrolled hyperglycemia: Stable this morning.  Continue Farxiga,SSI Recent Labs  Lab 01/16/22 0800 01/16/22 1128 01/16/22 1705 01/16/22 2125 01/17/22 0818  GLUCAP 88 173* 173* 162* 87     Normocytic anemia hemoglobin hemoglobin baseline 9, 6.9 on admission S/P 1 unit > recheck IN 8 GM- Transfusing threshold 7 g.  If okay with orthopedic RESUME Plavix and Eliquis Recent Labs  Lab 01/13/22 2046 01/14/22 0639 01/15/22 0709 01/16/22 0530 01/17/22 0735  HGB 6.9* 7.4* 7.4* 7.2* 8.0*  HCT 23.7* 24.8* 24.8* 24.7* 26.6*         Atrial fibrillation: Eliquis held 10/20- resume today CAD S/P percutaneous coronary angioplasty:CAD s/p PCI to RCA (2015) and Lcx (2022 On statin, Plavix pta) Chronic hypotension on midodrine.  DVT prophylaxis: SCDs Start: 01/14/22 1631 SCDs Start: 01/13/22 1847 Code Status:   Code Status: Full Code Family Communication: plan of care discussed with patient at bedside. Patient status is: Inpatient because of osteomyelitis  Level of care: Telemetry Medical  Dispo: The patient is from: home            Anticipated disposition: CIR pending.   Objective: Vitals last 24 hrs: Vitals:   01/16/22 2120 01/16/22 2336 01/17/22 0300 01/17/22 0816  BP: (!) 128/41 (!) 144/53 (!) 162/63 (!) 151/65  Pulse: 65 61 69 (!) 57  Resp: '18 18 18 18  '$ Temp: 98.4 F (36.9 C) 98.6 F (37 C) 97.9 F (36.6 C) 98 F (36.7 C)  TempSrc: Oral Oral Oral Oral  SpO2: 100% 95% 96% 100%  Weight change:   Physical Examination: General exam: AAox3, weak,older appearing HEENT:Oral mucosa moist, Ear/Nose WNL grossly, dentition  normal. Respiratory system: bilaterally clear BS, no use of accessory muscle Cardiovascular system: S1 & S2 +, regular rate. Gastrointestinal system: Abdomen soft, NT,ND,BS+ Nervous System:Alert, awake, moving extremities and grossly nonfocal Extremities: LE ankle edema neg, LLE w/ BKA stump  with dressing in place, wound VAC is out Skin: No rashes,no icterus. MSK: Normal muscle bulk,tone, power   Medications reviewed:  Scheduled Meds:  sodium chloride   Intravenous Once   atorvastatin  40 mg Oral q1800   dapagliflozin propanediol  10 mg Oral q morning   dexamethasone  0.5 mg Oral Daily   docusate sodium  100 mg Oral BID   ferrous sulfate  325 mg Oral QHS   insulin aspart  0-15 Units Subcutaneous TID PC & HS   insulin glargine-yfgn  24 Units Subcutaneous Daily   levothyroxine  125 mcg Oral Q0600   midodrine  5 mg Oral BID WC   pantoprazole  40 mg Oral Daily  Continuous Infusions:  methocarbamol (ROBAXIN) IV      Diet Order             Diet heart healthy/carb modified Room service appropriate? Yes; Fluid consistency: Thin  Diet effective now                  Intake/Output Summary (Last 24 hours) at 01/17/2022 0843 Last data filed at 01/17/2022 0300 Gross per 24 hour  Intake 2 ml  Output 1700 ml  Net -1698 ml    Net IO Since Admission: -1,358 mL [01/17/22 0843]  Wt Readings from Last 3 Encounters:  01/08/22 86.4 kg  12/18/21 92.5 kg  11/28/21 90.5 kg     Unresulted Labs (From admission, onward)     Start     Ordered   01/17/22 2542  Basic metabolic panel  Daily at 5am,   R      01/16/22 1407   01/17/22 0500  CBC  Daily at 5am,   R      01/16/22 1407   01/13/22 2229  Occult blood card to lab, stool  Once,   R        01/13/22 2228          Data Reviewed: I have personally reviewed following labs and imaging studies CBC: Recent Labs  Lab 01/13/22 2046 01/14/22 0639 01/15/22 0709 01/16/22 0530 01/17/22 0735  WBC 7.0 6.3 9.2 8.5 7.6  HGB 6.9* 7.4* 7.4* 7.2* 8.0*  HCT 23.7* 24.8* 24.8* 24.7* 26.6*  MCV 88.8 88.9 87.9 88.8 89.0  PLT 485* 417* 391 434* 465*    Basic Metabolic Panel: Recent Labs  Lab 01/13/22 2046 01/15/22 0709 01/16/22 0530  NA 139 137 139  K 3.8 3.7 3.5  CL 107 105 106  CO2 '23 24 25  '$ GLUCOSE 127* 237* 120*  BUN  '9 10 13  '$ CREATININE 0.78 0.63 0.65  CALCIUM 8.3* 7.8* 8.3*   CBG: Recent Labs  Lab 01/16/22 0800 01/16/22 1128 01/16/22 1705 01/16/22 2125 01/17/22 0818  GLUCAP 88 173* 173* 162* 87    Recent Results (from the past 240 hour(s))  Surgical pcr screen     Status: Abnormal   Collection Time: 01/08/22  3:03 PM   Specimen: Nasal Mucosa; Nasal Swab  Result Value Ref Range Status   MRSA, PCR POSITIVE (A) NEGATIVE Final    Comment: RESULT CALLED TO, READ BACK BY AND VERIFIED WITH: RN LEANN HUDSON ON 01/08/22 @ 1738 BY DRT    Staphylococcus aureus POSITIVE (A) NEGATIVE Final  Comment: (NOTE) The Xpert SA Assay (FDA approved for NASAL specimens in patients 96 years of age and older), is one component of a comprehensive surveillance program. It is not intended to diagnose infection nor to guide or monitor treatment. Performed at [facility], [place] [address] [place], [place] [address]     Antimicrobials: Anti-infectives (From admission, onward)    Start     Dose/Rate Route Frequency Ordered Stop   01/15/22 0000  vancomycin (VANCOREADY) IVPB 1750 mg/350 mL  Status:  Discontinued        1,750 mg 175 mL/hr over 120 Minutes Intravenous Every 24 hours 01/13/22 2214 01/15/22 1923   01/14/22 2200  linezolid (ZYVOX) tablet 600 mg  Status:  Discontinued        600 mg Oral 2 times daily 01/14/22 1630 01/14/22 1759   01/14/22 1730  ceFAZolin (ANCEF) IVPB 2g/100 mL premix  Status:  Discontinued        2 g 200 mL/hr over 30 Minutes Intravenous Every 6 hours 01/14/22 1630 01/14/22 1639   01/13/22 2300  vancomycin (VANCOREADY) IVPB 2000 mg/400 mL        2,000 mg 200 mL/hr over 120 Minutes Intravenous  Once 01/13/22 2201 01/14/22 0144   01/13/22 2230  cefTRIAXone (ROCEPHIN) 2 g in sodium chloride 0.9 % 100 mL IVPB  Status:  Discontinued        2 g 200 mL/hr over 30 Minutes Intravenous Every 24 hours 01/13/22 2137 01/15/22 1923     Culture/Microbiology    Component Value Date/Time    SDES TISSUE 07/15/2021 1237   SPECREQUEST LEFT NAVICULAR SPEC C 07/15/2021 1237   CULT  07/15/2021 1237    No growth aerobically or anaerobically. Performed at [facility], [place] [address], [place], [place] [address]    REPTSTATUS 07/20/2021 FINAL 07/15/2021 1237  No results found.  LOS: 4 days  Antonieta Pert, MD [facility] 01/17/2022, 8:43 AM

## 2022-01-17 NOTE — TOC Transition Note (Signed)
Transition of Care [facility]) - CM/SW Discharge Note   Patient Details  Name: Tammy Good MRN: [id number] Date of Birth: 1950/08/20  [facility] CM/SW Contact:  Verdell Carmine, RN Phone Number: 01/17/2022, 11:02 AM   Clinical Narrative:     Patient is stable for DC and will be admitted to CIR for further rehabilitation  Final next level of care: IP Rehab Facility Barriers to Discharge: No Barriers Identified   Patient Goals and CMS Choice Patient states their goals for this hospitalization and ongoing recovery are:: Rehab CMS Medicare.gov Compare Post Acute Care list provided to:: Patient Choice offered to / list presented to : Patient  Discharge Placement             CIR          Discharge Plan and Services In-house Referral: Clinical Social Work   Post Acute Care Choice: IP Rehab                               Social Determinants of Health (SDOH) Interventions     Readmission Risk Interventions    07/17/2021    2:40 PM 06/03/2021   12:08 PM 05/30/2019    1:15 PM  Readmission Risk Prevention Plan  Transportation Screening Complete Complete Complete  PCP or Specialist Appt within 5-7 Days   Complete  Home Care Screening   Complete  Medication Review (RN CM)   Complete  Medication Review Press photographer) Complete Complete   PCP or Specialist appointment within 3-5 days of discharge Complete Complete   HRI or [place] Complete Complete   SW Recovery Care/Counseling Consult Complete Complete   Palliative Care Screening Not Applicable Not [facility] Not Applicable Not Applicable

## 2022-01-17 NOTE — Discharge Summary (Signed)
Physician Discharge Summary  Tammy Good KCM:[id number] DOB: 1950/04/04 DOA: 01/13/2022  PCP: Default, Provider, MD  Admit date: 01/13/2022 Discharge date: 01/17/2022 Recommendations for Outpatient Follow-up:  Follow up with PCP in 1 weeks-call for appointment Follow up with orthopedics for ongoing postop care Please obtain BMP/CBC in 2 to 4 days to ensure hemoglobin stable Discharge Dispo: Inpatient rehab Discharge Condition: Stable Code Status:   Code Status: Full Code Diet recommendation:  Diet Order             Diet heart healthy/carb modified Room service appropriate? Yes; Fluid consistency: Thin  Diet effective now                    Brief/Interim Summary: 71 y.o. female with medical history significant of CAD s/p PCI to RCA (2015) and Lcx (2022), PAD with critical limb ischemia and right SFA stenting (10/2020), left SFA stenting and anterior tibial angioplasty (06/2021), HFrEF (EF 40 to 45% by echo 04/2021), atrial fibrillation, insulin dependent T2DM, severe septic arthritis presents for planned amputation of left BKA.has been followed for her osteomyelitis by ID for months, and decision has been made for BKA, she is on Plavix and Eliquis, her hemoglobin was noted to be low at 6.9, so she has been admitted for medical optimization before surgical intervention Patient underwent left BKA.  Postop doing well  Pain is controlled, hemoglobin stable, placed back on Eliquis and Plavix, and is being discharged to inpatient rehab   DIscharge Diagnoses:  Principal Problem:   Osteomyelitis ([place]) Active Problems:   Essential hypertension   Diabetes mellitus, type 2 ([place])   PAD (peripheral artery disease) ([place])   Hypothyroidism   Normocytic anemia   CAD S/P percutaneous coronary angioplasty   Atrial fibrillation (HCC) Osteomyelitis of left ankle  Severe septic arthritis of the talonavicular joint, calcaneocuboid joint w/ osteomyelitis: S/p lt BKA 10/24.  Postop doing well  continue pain control, PT OT and CIR placement once bed available.  Discussed with orthopedics, okay to resume Eliquis and Plavix, patient is also agreeable.  We will need to closely watch for hemoglobin. NWB LLE   Essential hypertension:Controlled, cont prn   Hypothyroidism:Continue levothyroxine   PAD: S/P right SFA stenting (10/2020), left SFA stenting and anterior tibial angioplasty (06/2021).Follows with [name] with vascular outpatient cont statin.   Diabetes mellitus, type 2 w/ uncontrolled hyperglycemia: Stable this morning.  Continue Farxiga,SSI Last Labs          Recent Labs  Lab 01/16/22 0800 01/16/22 1128 01/16/22 1705 01/16/22 2125 01/17/22 0818  GLUCAP 88 173* 173* 162* 87       Normocytic anemia hemoglobin hemoglobin baseline 9, 6.9 on admission S/P 1 unit > recheck IN 8 GM- Transfusing threshold 7 g.  If okay with orthopedic RESUME Plavix and Eliquis Last Labs          Recent Labs  Lab 01/13/22 2046 01/14/22 0639 01/15/22 0709 01/16/22 0530 01/17/22 0735  HGB 6.9* 7.4* 7.4* 7.2* 8.0*  HCT 23.7* 24.8* 24.8* 24.7* 26.6*           Atrial fibrillation: Eliquis held 10/20- resume today CAD S/P percutaneous coronary angioplasty:CAD s/p PCI to RCA (2015) and Lcx (2022 On statin, Plavix pta) Chronic hypotension on midodrine.  Consults: Orthopedics Subjective: Alert awake resting comfortably pain is controlled.  Wound VAC removed this morning  Discharge Exam: Vitals:   01/17/22 0300 01/17/22 0816  BP: (!) 162/63 (!) 151/65  Pulse: 69 (!) 57  Resp: 18 18  Temp: 97.9 F (36.6 C) 98 F (36.7 C)  SpO2: 96% 100%   General: Pt is alert, awake, not in acute distress Cardiovascular: RRR, S1/S2 +, no rubs, no gallops Respiratory: CTA bilaterally, no wheezing, no rhonchi Abdominal: Soft, NT, ND, bowel sounds + Extremities: no edema, no cyanosis  Discharge Instructions  Discharge Instructions     Discharge instructions   Complete by: As directed    Check  CBC in 3 to 4 days at rehab facility to ensure hemoglobin is stable Follow-up with orthopedics   Discharge wound care:   Complete by: As directed    Reinforce dressing as needed   Increase activity slowly   Complete by: As directed       Allergies as of 01/17/2022       Reactions   Strawberry Extract Itching, Swelling, Anaphylaxis   Mouth swells and gets itchy        Medication List     STOP taking these medications    linezolid 600 MG tablet Commonly known as: Zyvox   nystatin cream Commonly known as: MYCOSTATIN       TAKE these medications    acetaminophen 500 MG tablet Commonly known as: TYLENOL Take 500-1,000 mg by mouth 3 (three) times daily as needed for headache or mild pain.   apixaban 5 MG Tabs tablet Commonly known as: ELIQUIS Take 1 tablet (5 mg total) by mouth 2 (two) times daily.   atorvastatin 40 MG tablet Commonly known as: LIPITOR Take 1 tablet (40 mg total) by mouth daily at 6 PM. What changed: when to take this   B-D UF III MINI PEN NEEDLES 31G X 5 MM Misc Generic drug: Insulin Pen Needle Inject into the skin.   BASAGLAR KWIKPEN [place] Inject 42 Units into the skin at bedtime.   clopidogrel 75 MG tablet Commonly known as: PLAVIX TAKE 1 TABLET BY MOUTH DAILY   dapagliflozin propanediol 10 MG Tabs tablet Commonly known as: FARXIGA Take 10 mg by mouth daily.   dexamethasone 0.5 MG tablet Commonly known as: Decadron Take 2 tablets (1 mg total) by mouth daily. What changed: how much to take   ferrous sulfate 325 (65 FE) MG tablet Take 325 mg by mouth at bedtime.   FreeStyle Libre 2 Sensor Misc Apply topically every 14 (fourteen) days.   furosemide 20 MG tablet Commonly known as: Lasix Take 1 tablet (20 mg total) by mouth daily as needed for fluid or edema.   levothyroxine 125 MCG tablet Commonly known as: SYNTHROID Take 1 tablet (125 mcg total) by mouth daily before breakfast.   metoCLOPramide 5 MG tablet Commonly known as:  Reglan Take 1 tablet (5 mg total) by mouth 3 (three) times daily as needed for nausea or vomiting. Take before meals as needed, if having nausea What changed: additional instructions   midodrine 5 MG tablet Commonly known as: PROAMATINE Take 5 mg by mouth in the morning and at bedtime.   nitroGLYCERIN 0.4 MG SL tablet Commonly known as: NITROSTAT Place 0.4 mg under the tongue every 5 (five) minutes as needed for chest pain.   NovoLOG FlexPen 100 UNIT/ML FlexPen Generic drug: insulin aspart Inject 0-7 Units into the skin 3 (three) times daily with meals. Sliding scale insulin   pantoprazole 40 MG tablet Commonly known as: PROTONIX Take 40 mg by mouth daily.   polyethylene glycol powder 17 GM/SCOOP powder Commonly known as: GLYCOLAX/MIRALAX Take 1 capful with water (17 g) by mouth daily. What changed:  when to  take this reasons to take this   Systane Complete 0.6 % Soln Generic drug: Propylene Glycol Place 1 drop into both eyes 2 (two) times daily as needed (dry/irritated eyes.).   traMADol 50 MG tablet Commonly known as: ULTRAM Take 50 mg by mouth every 6 (six) hours as needed (pain.).               Discharge Care Instructions  (From admission, onward)           Start     Ordered   01/17/22 0000  Discharge wound care:       Comments: Reinforce dressing as needed   01/17/22 1101            Allergies  Allergen Reactions   Strawberry Extract Itching, Swelling and Anaphylaxis    Mouth swells and gets itchy    The results of significant diagnostics from this hospitalization (including imaging, microbiology, ancillary and laboratory) are listed below for reference.    Microbiology: Recent Results (from the past 240 hour(s))  Surgical pcr screen     Status: Abnormal   Collection Time: 01/08/22  3:03 PM   Specimen: Nasal Mucosa; Nasal Swab  Result Value Ref Range Status   MRSA, PCR POSITIVE (A) NEGATIVE Final    Comment: RESULT CALLED TO, READ BACK  BY AND VERIFIED WITH: RN LEANN HUDSON ON 01/08/22 @ 1738 BY DRT    Staphylococcus aureus POSITIVE (A) NEGATIVE Final    Comment: (NOTE) The Xpert SA Assay (FDA approved for NASAL specimens in patients 36 years of age and older), is one component of a comprehensive surveillance program. It is not intended to diagnose infection nor to guide or monitor treatment. Performed at [facility], [place] [address], [place], [place] [address]     Procedures/Studies: No results found.  Labs: BNP (last 3 results) Recent Labs    06/02/21 0634 06/04/21 1633 07/07/21 0325  BNP 1,007.5* 362.3* 93.8   Basic Metabolic Panel: Recent Labs  Lab 01/13/22 2046 01/15/22 0709 01/16/22 0530 01/17/22 0735  NA 139 137 139 140  K 3.8 3.7 3.5 3.9  CL 107 105 106 107  CO2 '23 24 25 27  '$ GLUCOSE 127* 237* 120* 102*  BUN '9 10 13 13  '$ CREATININE 0.78 0.63 0.65 0.70  CALCIUM 8.3* 7.8* 8.3* 8.4*   Liver Function Tests: No results for input(s): "AST", "ALT", "ALKPHOS", "BILITOT", "PROT", "ALBUMIN" in the last 168 hours. No results for input(s): "LIPASE", "AMYLASE" in the last 168 hours. No results for input(s): "AMMONIA" in the last 168 hours. CBC: Recent Labs  Lab 01/13/22 2046 01/14/22 0639 01/15/22 0709 01/16/22 0530 01/17/22 0735  WBC 7.0 6.3 9.2 8.5 7.6  HGB 6.9* 7.4* 7.4* 7.2* 8.0*  HCT 23.7* 24.8* 24.8* 24.7* 26.6*  MCV 88.8 88.9 87.9 88.8 89.0  PLT 485* 417* 391 434* 465*   Cardiac Enzymes: No results for input(s): "CKTOTAL", "CKMB", "CKMBINDEX", "TROPONINI" in the last 168 hours. BNP: Invalid input(s): "POCBNP" CBG: Recent Labs  Lab 01/16/22 0800 01/16/22 1128 01/16/22 1705 01/16/22 2125 01/17/22 0818  GLUCAP 88 173* 173* 162* 87   D-Dimer No results for input(s): "DDIMER" in the last 72 hours. Hgb A1c No results for input(s): "HGBA1C" in the last 72 hours. Lipid Profile No results for input(s): "CHOL", "HDL", "LDLCALC", "TRIG", "CHOLHDL", "LDLDIRECT" in the last 72  hours. Thyroid function studies No results for input(s): "TSH", "T4TOTAL", "T3FREE", "THYROIDAB" in the last 72 hours.  Invalid input(s): "FREET3" Anemia work up No  results for input(s): "VITAMINB12", "FOLATE", "FERRITIN", "TIBC", "IRON", "RETICCTPCT" in the last 72 hours. Urinalysis    Component Value Date/Time   COLORURINE YELLOW 06/05/2021 0041   APPEARANCEUR HAZY (A) 06/05/2021 0041   LABSPEC 1.014 06/05/2021 0041   PHURINE 5.0 06/05/2021 0041   GLUCOSEU NEGATIVE 06/05/2021 0041   HGBUR NEGATIVE 06/05/2021 0041   BILIRUBINUR NEGATIVE 06/05/2021 0041   KETONESUR NEGATIVE 06/05/2021 0041   PROTEINUR NEGATIVE 06/05/2021 0041   NITRITE NEGATIVE 06/05/2021 0041   LEUKOCYTESUR MODERATE (A) 06/05/2021 0041   Sepsis Labs Recent Labs  Lab 01/14/22 0639 01/15/22 0709 01/16/22 0530 01/17/22 0735  WBC 6.3 9.2 8.5 7.6   Microbiology Recent Results (from the past 240 hour(s))  Surgical pcr screen     Status: Abnormal   Collection Time: 01/08/22  3:03 PM   Specimen: Nasal Mucosa; Nasal Swab  Result Value Ref Range Status   MRSA, PCR POSITIVE (A) NEGATIVE Final    Comment: RESULT CALLED TO, READ BACK BY AND VERIFIED WITH: RN LEANN HUDSON ON 01/08/22 @ 1738 BY DRT    Staphylococcus aureus POSITIVE (A) NEGATIVE Final    Comment: (NOTE) The Xpert SA Assay (FDA approved for NASAL specimens in patients 48 years of age and older), is one component of a comprehensive surveillance program. It is not intended to diagnose infection nor to guide or monitor treatment. Performed at [facility], [place] [address] [place], [place] [address]    Time coordinating discharge: 25 minutes SIGNED: Antonieta Pert, MD  [facility] 01/17/2022, 11:02 AM  If 7PM-7AM, please contact night-coverage [email or web address]

## 2022-01-17 NOTE — Progress Notes (Signed)
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 PMH: CAD s/p PCI to RCA, PAD, HRrEF, AFib, T2DM, septic arthritis   OT comments  Patient scheduled for transition to [facility] this date.  Continues to work hard, and should do very well at [facility].  Patient is largely one assist, and carries over cues very well.  Generalized Min A for transfers, and Mod A for lower body ADL without adaptive equipment.     Recommendations for follow up therapy are one component of a multi-disciplinary discharge planning process, led by the attending physician.  Recommendations may be updated based on patient status, additional functional criteria and insurance authorization.    Follow Up Recommendations  Acute inpatient rehab (3hours/day)    Assistance Recommended at Discharge Frequent or constant Supervision/Assistance  Patient can return home with the following  A lot of help with walking and/or transfers;A lot of help with bathing/dressing/bathroom;Help with stairs or ramp for entrance;Assist for transportation;Assistance with Education officer, environmental (measurements OT);Wheelchair cushion (measurements OT)    Recommendations for Other Services      Precautions / Restrictions Precautions Precautions: Fall Restrictions Weight Bearing Restrictions: Yes LLE Weight Bearing: Non weight bearing       Mobility Bed Mobility Overal bed mobility: Needs Assistance Bed Mobility: Supine to Sit     Supine to sit: Supervision          Transfers Overall transfer level: Needs assistance Equipment used: Rolling walker (2 wheels) Transfers: Sit to/from Stand, Bed to chair/wheelchair/BSC Sit to Stand: Min assist Stand pivot transfers: Min assist                Balance Overall balance assessment: Needs assistance Sitting-balance support: No upper extremity supported, Feet supported Sitting balance-Leahy Scale: Good     Standing balance support: Bilateral upper extremity supported, Reliant on assistive device for balance Standing balance-Leahy Scale: Poor                             ADL either performed or assessed with clinical judgement   ADL   Eating/Feeding: Independent;Sitting   Grooming: Wash/dry hands;Wash/dry face;Oral care;Sitting;Set up               Lower Body Dressing: Moderate assistance;Sitting/lateral leans   Toilet Transfer: Minimal assistance;Stand-pivot;BSC/3in1                  Extremity/Trunk Assessment              Vision       Perception     Praxis      Cognition Arousal/Alertness: Awake/alert Behavior During Therapy: WFL for tasks assessed/performed Overall Cognitive Status: Within Functional Limits for tasks assessed                                          Exercises      Shoulder Instructions       General Comments      Pertinent Vitals/ Pain       Pain Assessment Pain Assessment: Faces Faces Pain Scale: Hurts a little bit Pain Location: L LE incision site Pain Descriptors / Indicators: Tender Pain Intervention(s):  Monitored during session                                                          Frequency  Min 2X/week        Progress Toward Goals  OT Goals(current goals can now be found in the care plan section)  Progress towards OT goals: Progressing toward goals  Acute Rehab OT Goals OT Goal Formulation: With patient Time For Goal Achievement: 01/29/22 Potential to Achieve Goals: Good  Plan Discharge plan remains appropriate    Co-evaluation                 AM-PAC OT "6 Clicks" Daily Activity     Outcome Measure   Help from another person eating meals?: None Help from  another person taking care of personal grooming?: None Help from another person toileting, which includes using toliet, bedpan, or urinal?: A Little Help from another person bathing (including washing, rinsing, drying)?: A Lot Help from another person to put on and taking off regular upper body clothing?: None Help from another person to put on and taking off regular lower body clothing?: A Lot 6 Click Score: 19    End of Session Equipment Utilized During Treatment: Rolling walker (2 wheels)  OT Visit Diagnosis: Unsteadiness on feet (R26.81);Muscle weakness (generalized) (M62.81);Other abnormalities of gait and mobility (R26.89)   Activity Tolerance Patient tolerated treatment well   Patient Left in chair;with call bell/phone within reach   Nurse Communication Mobility status        Time: 1000-1017 OT Time Calculation (min): 17 min  Charges: OT General Charges $OT Visit: 1 Visit OT Treatments $Self Care/Home Management : 8-22 mins  01/17/2022  RP, OTR/L  Acute Rehabilitation Services  Office:  [phone]   Metta Clines 01/17/2022, 11:35 AM

## 2022-01-17 NOTE — Progress Notes (Signed)
     Tammy Good is a 71 y.o. female   Orthopaedic diagnosis: Status post left BKA 01/14/2022   Subjective: Patient doing well.  Pain well controlled.  She has had minimal output in the wound VAC.  Repeat CBC today not available yet.  No new concerns.  She feels very well and is pleased with recovery so far.  She is looking forward to transition to inpatient rehab.  He is urinating well, but is not had a bowel movement.  She is on medications and is working towards this.  She is passing gas.  Objectyive: Vitals:   01/16/22 2336 01/17/22 0300  BP: (!) 144/53 (!) 162/63  Pulse: 61 69  Resp: 18 18  Temp: 98.6 F (37 C) 97.9 F (36.6 C)  SpO2: 95% 96%     Exam: Awake and alert Respirations even and unlabored No acute distress  Left lower extremity shows intact surgical bandage and wound VAC status post BKA.  There is no more drainage in the wound VAC.  She is able to gently actively range the knee.  The dressing and wound VAC was removed uneventfully.  The wound is well approximated with sutures in place.  No evidence of infection or dehiscence.  Swelling appropriate.  Dressing using sterile Xeroform, 4 x 4 gauze, Kerlix, and an Ace wrap was applied uneventfully.  Assessment: Postop day 3 status post above, doing well and suitable for transition to inpatient rehab from orthopedic standpoint.   Plan: -Nonweightbearing left lower extremity - Continue PT/OT.  Encourage mobilization when able. -Keep dressing clean, dry, and intact -We will consider restarting baseline Eliquis and Plavix today based on hemoglobin trend today.  CBC pending.  -Pain control as needed   She is planning for inpatient rehab postoperatively.     Plan for follow-up outpatient with Dr. Lucia Gaskins at [facility] 2 weeks from surgery for wound check and reevaluation.  Would likely leave sutures in place for 4 weeks postoperatively, dependent upon wound healing.   Alex Mcmanigal J. [place], PA-C

## 2022-01-17 NOTE — H&P (Addendum)
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 Linus Salmons. She had planned for surgery but admitted on 01/13/22 due to downward trend in Hgb to 7.0 and transfused with one unit PRBC. Eliquis and Plavix placed on hold and she underwent L-BKA by Dr. Lucia Gaskins on 01/14/22. Post op with minimal pain and H/H stable therefore Eliquis and Plavix resumed today.  She does not report phantom pain but has had an itching phantom sensation.  Reports she has not had a bowel movement in several days and feels constipated.  Therapy has been working with patient who continues to be limited by pain, fatigue and limb loss. CIR recommended due to functional decline.    Review of Systems  Constitutional:  Negative for chills and fever.  HENT:  Positive for hearing loss.   Eyes:  Negative for blurred vision and double vision.  Respiratory:  Negative for cough and shortness of breath.   Cardiovascular:  Negative for chest pain and palpitations.  Gastrointestinal:  Positive for constipation. Negative for abdominal pain, diarrhea, heartburn, nausea and vomiting.  Genitourinary:  Negative for dysuria.  Musculoskeletal:  Positive for myalgias.  Skin:  Negative for rash.  Neurological:  Positive for weakness. Negative for dizziness, sensory change and headaches.  Psychiatric/Behavioral:  The patient is not nervous/anxious and does not have insomnia.      Past Surgical History:  Procedure Laterality Date   ABDOMINAL AORTOGRAM W/LOWER EXTREMITY N/A 05/26/2019   Procedure: ABDOMINAL AORTOGRAM W/LOWER EXTREMITY;  Surgeon: Marty Heck, MD;   Location: [place] [facility];  Service: Cardiovascular;  Laterality: N/A;   ABDOMINAL AORTOGRAM W/LOWER EXTREMITY N/A 11/15/2020   Procedure: ABDOMINAL AORTOGRAM W/LOWER EXTREMITY;  Surgeon: Marty Heck, MD;  Location: [place] [facility];  Service: Cardiovascular;  Laterality: N/A;   ABDOMINAL AORTOGRAM W/LOWER EXTREMITY N/A 04/18/2021   Procedure: ABDOMINAL AORTOGRAM W/LOWER EXTREMITY;  Surgeon: Marty Heck, MD;  Location: [place] [facility];  Service: Cardiovascular;  Laterality: N/A;   ABDOMINAL AORTOGRAM W/LOWER EXTREMITY Left 07/11/2021   Procedure: ABDOMINAL AORTOGRAM W/LOWER EXTREMITY;  Surgeon: Marty Heck, MD;  Location: [place] [facility];  Service: Cardiovascular;  Laterality: Left;   AMPUTATION Left 05/25/2019   Procedure: AMPUTATION RAY 5th;  Surgeon: Trula Slade, DPM;  Location: [place];  Service: Podiatry;  Laterality: Left;   AMPUTATION Left 01/14/2022   Procedure: LEFT BELOW THE KNEE AMPUTATION;  Surgeon: Erle Crocker, MD;  Location: [place];  Service: Orthopedics;  Laterality: Left;  LENGTH OF SURGERY: 90 MINUTES   APPLICATION OF WOUND VAC Left 01/14/2022   Procedure: WOUND VAC PLACEMENT;  Surgeon: Erle Crocker, MD;  Location: [place];  Service: Orthopedics;  Laterality: Left;   BONE BIOPSY Left 04/24/2021   Procedure: BONE BIOPSY;  Surgeon: Landis Martins, DPM;  Location: [place];  Service: Podiatry;  Laterality: Left;  left ankle/fibula   BONE BIOPSY Left 07/15/2021   Procedure: LEFT FOOT BONE BIOPSY;  Surgeon: Trula Slade, DPM;  Location: [place];  Service: Podiatry;  Laterality: Left;   Cardiac Event Monitor  July-August 2015   Sinus rhythm with PVCs   CHOLECYSTECTOMY  COLONOSCOPY N/A 08/31/2013   Procedure: COLONOSCOPY;  Surgeon: Juanita Craver, MD;  Location: [facility] ENDOSCOPY;  Service: Endoscopy;  Laterality: N/A;   CORONARY STENT INTERVENTION N/A 11/19/2020   Procedure: CORONARY STENT INTERVENTION;  Surgeon: Nelva Bush, MD;  Location: [place] [facility];  Service: Cardiovascular;  Laterality: N/A;   ESOPHAGOGASTRODUODENOSCOPY N/A 09/01/2013   Procedure: ESOPHAGOGASTRODUODENOSCOPY (EGD);  Surgeon: Beryle Beams, MD;  Location: [facility] ENDOSCOPY;  Service: Endoscopy;  Laterality: N/A;  bedside   ESOPHAGOGASTRODUODENOSCOPY (EGD) WITH PROPOFOL N/A 05/26/2021   Procedure: ESOPHAGOGASTRODUODENOSCOPY (EGD) WITH PROPOFOL;  Surgeon: Carol Ada, MD;  Location: [place];  Service: Gastroenterology;  Laterality: N/A;   EYE SURGERY Bilateral    bilateral cataracts   INCISION AND DRAINAGE Left 04/24/2021   Procedure: INCISION AND DRAINAGE;  Surgeon: Landis Martins, DPM;  Location: [place];  Service: Podiatry;  Laterality: Left;   IRRIGATION AND DEBRIDEMENT FOOT Left 07/15/2021   Procedure: IRRIGATION AND DEBRIDEMENT FOOT;  Surgeon: Trula Slade, DPM;  Location: [place];  Service: Podiatry;  Laterality: Left;   LEFT HEART CATH AND CORONARY ANGIOGRAPHY N/A 11/19/2020   Procedure: LEFT HEART CATH AND CORONARY ANGIOGRAPHY;  Surgeon: Nelva Bush, MD;  Location: [place] [facility];  Service: Cardiovascular;  Laterality: N/A;   LEFT HEART CATHETERIZATION WITH CORONARY ANGIOGRAM N/A 08/30/2013   Procedure: LEFT HEART CATHETERIZATION WITH CORONARY ANGIOGRAM;  Surgeon: Leonie Man, MD;  Location: [facility] CATH LAB: 100% mRCA (thrombus - extends to RPAV), 80% D1, 40% AVG Cx.   PERCUTANEOUS CORONARY STENT INTERVENTION (PCI-S)  08/30/2013   Procedure: PERCUTANEOUS CORONARY STENT INTERVENTION (PCI-S);  Surgeon: Leonie Man, MD;  Location: [facility] CATH LAB;  Integrity Resolute DES 2.0 mm x 38 mm -- 3.35 mm.; PTCA of proximal RPA V. - 3.0 mm x 15 mm balloon   PERIPHERAL VASCULAR ATHERECTOMY  05/26/2019   Procedure: PERIPHERAL VASCULAR ATHERECTOMY;  Surgeon: Marty Heck, MD;  Location: [place] [facility];  Service: Cardiovascular;;  Left SFA   PERIPHERAL VASCULAR BALLOON ANGIOPLASTY Left 07/11/2021   Procedure:  PERIPHERAL VASCULAR BALLOON ANGIOPLASTY;  Surgeon: Marty Heck, MD;  Location: [place] [facility];  Service: Cardiovascular;  Laterality: Left;  PT TRUNK / AT   PERIPHERAL VASCULAR INTERVENTION  05/26/2019   Procedure: PERIPHERAL VASCULAR INTERVENTION;  Surgeon: Marty Heck, MD;  Location: [place] [facility];  Service: Cardiovascular;;  Left SFA   PERIPHERAL VASCULAR INTERVENTION Right 11/15/2020   Procedure: PERIPHERAL VASCULAR INTERVENTION;  Surgeon: Marty Heck, MD;  Location: [place] [facility];  Service: Cardiovascular;  Laterality: Right;  Superficial Femoral Artery   PERIPHERAL VASCULAR INTERVENTION Left 07/11/2021   Procedure: PERIPHERAL VASCULAR INTERVENTION;  Surgeon: Marty Heck, MD;  Location: [place] [facility];  Service: Cardiovascular;  Laterality: Left;  SFA   PITUITARY SURGERY     TEE WITHOUT CARDIOVERSION N/A 04/26/2021   Procedure: TRANSESOPHAGEAL ECHOCARDIOGRAM (TEE);  Surgeon: Buford Dresser, MD;  Location: [facility] ENDOSCOPY;  Service: Cardiovascular;  Laterality: N/A;   TRANSTHORACIC ECHOCARDIOGRAM  08/30/2013   mild LVH. EF 50-55%. Moderate HK of the entire inferior myocardium. GR 1 DD. Mild LA dilation. Mildly reduced RV function   TYMPANOMASTOIDECTOMY Right 12/28/2017   Procedure: RIGHT TYMPANOMASTOIDECTOMY;  Surgeon: Leta Baptist, MD;  Location: [place];  Service: ENT;  Laterality: Right;    Family History  Problem Relation Age of Onset   Cancer Mother 34       multiple myeloma   Heart attack Father 11  Cancer Sister    Alzheimer's disease Maternal Grandmother     Social History:  reports that she has quit smoking. She has never used smokeless tobacco. She reports that she does not drink alcohol and does not use drugs.   Allergies  Allergen Reactions   Strawberry Extract Itching, Swelling and Anaphylaxis    Mouth swells and gets itchy    Medications Prior to Admission  Medication Sig Dispense Refill    acetaminophen (TYLENOL) 500 MG tablet Take 500-1,000 mg by mouth 3 (three) times daily as needed for headache or mild pain.     apixaban (ELIQUIS) 5 MG TABS tablet Take 1 tablet (5 mg total) by mouth 2 (two) times daily. 60 tablet 6   atorvastatin (LIPITOR) 40 MG tablet Take 1 tablet (40 mg total) by mouth daily at 6 PM. (Patient taking differently: Take 40 mg by mouth daily.) 30 tablet 0   B-D UF III MINI PEN NEEDLES 31G X 5 MM MISC Inject into the skin.     clopidogrel (PLAVIX) 75 MG tablet TAKE 1 TABLET BY MOUTH DAILY 30 tablet 3   Continuous Blood Gluc Sensor (FREESTYLE LIBRE 2 SENSOR) MISC Apply topically every 14 (fourteen) days.     dapagliflozin propanediol (FARXIGA) 10 MG TABS tablet Take 10 mg by mouth daily.     dexamethasone (DECADRON) 0.5 MG tablet Take 2 tablets (1 mg total) by mouth daily. (Patient taking differently: Take 0.5 mg by mouth daily.) 28 tablet 0   ferrous sulfate 325 (65 FE) MG tablet Take 325 mg by mouth at bedtime.     furosemide (LASIX) 20 MG tablet Take 1 tablet (20 mg total) by mouth daily as needed for fluid or edema. 30 tablet 0   insulin aspart (NOVOLOG FLEXPEN) 100 UNIT/ML FlexPen Inject 0-7 Units into the skin 3 (three) times daily with meals. Sliding scale insulin     Insulin Glargine (BASAGLAR KWIKPEN [place]) Inject 42 Units into the skin at bedtime.     levothyroxine (SYNTHROID) 125 MCG tablet Take 1 tablet (125 mcg total) by mouth daily before breakfast. 30 tablet 2   metoCLOPramide (REGLAN) 5 MG tablet Take 1 tablet (5 mg total) by mouth 3 (three) times daily as needed for nausea or vomiting. Take before meals as needed, if having nausea (Patient taking differently: Take 5 mg by mouth 3 (three) times daily as needed for nausea or vomiting.) 90 tablet 1   midodrine (PROAMATINE) 5 MG tablet Take 5 mg by mouth in the morning and at bedtime.     nitroGLYCERIN (NITROSTAT) 0.4 MG SL tablet Place 0.4 mg under the tongue every 5 (five) minutes as needed for chest pain.      pantoprazole (PROTONIX) 40 MG tablet Take 40 mg by mouth daily.     polyethylene glycol powder (GLYCOLAX/MIRALAX) 17 GM/SCOOP powder Take 1 capful with water (17 g) by mouth daily. (Patient taking differently: Take 17 g by mouth daily as needed (constipation.).) 238 g 0   Propylene Glycol (SYSTANE COMPLETE) 0.6 % SOLN Place 1 drop into both eyes 2 (two) times daily as needed (dry/irritated eyes.).     traMADol (ULTRAM) 50 MG tablet Take 50 mg by mouth every 6 (six) hours as needed (pain.).       Home: Home Living Family/patient expects to be discharged to:: Private residence Living Arrangements: Alone Available Help at Discharge:  (granddaughter, Delight Hoh, moving in . alot of family to asisst) Type of Home: House Home Access: Stairs to enter [employer]  of Steps: 3-4 at the front, 4 through the garage (typical entrance) Entrance Stairs-Rails: Left Home Layout: One level Bathroom Shower/Tub: Multimedia programmer: Standard Bathroom Accessibility: No Home Equipment: Shower seat, [place] - single point, Conservation officer, nature (2 wheels), Hand held shower head, Toilet riser, BSC/3in1 Additional Comments: pt unable to get off toilet by herself with RW in front of her.  Lives With: Alone   Functional History: Prior Function Prior Level of Function : Independent/Modified Independent, Driving Mobility Comments: uses cane most of the time but will occasionally use RW in the home or out in community. uses electric shopping carts in stores ADLs Comments: has been sponge bathing since L foot issues. Mod I for ADLs, IADLs, drives to appts and stores   Functional Status:  Mobility: Bed Mobility Overal bed mobility: Needs Assistance Bed Mobility: Rolling, Supine to Sit Rolling: Supervision Supine to sit: Supervision General bed mobility comments: supervision for safety Transfers Overall transfer level: Needs assistance Equipment used: Rolling walker (2 wheels) Transfers: Sit  to/from Stand, Bed to chair/wheelchair/BSC Sit to Stand: Min assist Bed to/from chair/wheelchair/BSC transfer type:: Lateral/scoot transfer Stand pivot transfers: Min assist  Lateral/Scoot Transfers: Min assist General transfer comment: minA to scoot laterally from elevated bed to R side, then completed repeated sit-stand transfers with heavy dependence on BUE support and minA to steady with transition from UE on armrests to RW. Ambulation/Gait Ambulation/Gait assistance: Min assist Gait Distance (Feet): 4 Feet Assistive device: Rolling walker (2 wheels) General Gait Details: hop-to pattern with minA to steady. pt limited by fatigue and pain in RLE with hopping Gait velocity: decreased Gait velocity interpretation: <1.31 ft/sec, indicative of household ambulator   ADL: ADL Overall ADL's : Needs assistance/impaired Eating/Feeding: Independent, Bed level Grooming: Wash/dry hands, Wash/dry face, Oral care, Sitting, Supervision/safety Upper Body Bathing: Supervision/ safety, Sitting Lower Body Bathing: Minimal assistance, Bed level, Moderate assistance Upper Body Dressing : Min guard, Sitting Lower Body Dressing: Moderate assistance, Bed level, Minimal assistance Toilet Transfer: Minimal assistance, +2 for safety/equipment, Stand-pivot Toileting- Clothing Manipulation and Hygiene: Minimal assistance, +2 for safety/equipment, Sit to/from stand   Cognition: Cognition Overall Cognitive Status: Within Functional Limits for tasks assessed Orientation Level: Oriented X4 Cognition Arousal/Alertness: Awake/alert Behavior During Therapy: WFL for tasks assessed/performed Overall Cognitive Status: Within Functional Limits for tasks assessed       Blood pressure (!) 141/61, pulse 77, temperature 98.1 F (36.7 C), temperature source Oral, resp. rate 16, height _0  (1.702 m), weight 87.5 kg, SpO2 99 %.   General:  No apparent distress HEENT: Head is normocephalic, atraumatic, PERRLA, EOMI,  sclera anicteric, oral mucosa pink and moist, dentition intact, ext ear canals clear, hard of hearing Neck: Supple without JVD or lymphadenopathy Heart: Reg rate and rhythm. No murmurs rubs or gallops Chest: CTA bilaterally without wheezes, rales, or rhonchi; no distress Abdomen: Soft, non-tender, mildly-distended, bowel sounds positive. Extremities: No clubbing, cyanosis, or edema. Pulses are 2+ Psych: Pt's affect is appropriate. Pt is cooperative Skin: Dry skin on RLE, Clean and intact without signs of breakdown Left BKA residual limb with mild tenderness with dry dressing in place Neuro:  Alert and oriented x 4, follows commands, cranial nerves II through XII intact Strength 5 out of 5 in bilateral upper extremities and right lower extremity Strength 5 out of 5 left hip flexion Sensation intact light touch in all 4 extremities Musculoskeletal: No joint swelling or tenderness noted, no abnormal tone PIV R forearm and L upper arm  Results for orders placed  or performed during the hospital encounter of 01/13/22 (from the past 48 hour(s))  Glucose, capillary     Status: Abnormal   Collection Time: 01/15/22  9:00 PM  Result Value Ref Range   Glucose-Capillary 226 (H) 70 - 99 mg/dL    Comment: Glucose reference range applies only to samples taken after fasting for at least 8 hours.  CBC     Status: Abnormal   Collection Time: 01/16/22  5:30 AM  Result Value Ref Range   WBC 8.5 4.0 - 10.5 K/uL   RBC 2.78 (L) 3.87 - 5.11 MIL/uL   Hemoglobin 7.2 (L) 12.0 - 15.0 g/dL   HCT 24.7 (L) 36.0 - 46.0 %   MCV 88.8 80.0 - 100.0 fL   MCH 25.9 (L) 26.0 - 34.0 pg   MCHC 29.1 (L) 30.0 - 36.0 g/dL   RDW 21.0 (H) 11.5 - 15.5 %   Platelets 434 (H) 150 - 400 K/uL   nRBC 0.0 0.0 - 0.2 %    Comment: Performed at [facility], [place] [address], [place], [place] [address]  Basic metabolic panel     Status: Abnormal   Collection Time: 01/16/22  5:30 AM  Result Value Ref Range   Sodium 139 135 - 145  mmol/L   Potassium 3.5 3.5 - 5.1 mmol/L   Chloride 106 98 - 111 mmol/L   CO2 25 22 - 32 mmol/L   Glucose, Bld 120 (H) 70 - 99 mg/dL    Comment: Glucose reference range applies only to samples taken after fasting for at least 8 hours.   BUN 13 8 - 23 mg/dL   Creatinine, Ser 0.65 0.44 - 1.00 mg/dL   Calcium 8.3 (L) 8.9 - 10.3 mg/dL   GFR, Estimated >60 >60 mL/min    Comment: (NOTE) Calculated using the CKD-EPI Creatinine Equation (2021)    Anion gap 8 5 - 15    Comment: Performed at [facility] [address] [place], [place] [address]  Glucose, capillary     Status: None   Collection Time: 01/16/22  8:00 AM  Result Value Ref Range   Glucose-Capillary 88 70 - 99 mg/dL    Comment: Glucose reference range applies only to samples taken after fasting for at least 8 hours.  Glucose, capillary     Status: Abnormal   Collection Time: 01/16/22 11:28 AM  Result Value Ref Range   Glucose-Capillary 173 (H) 70 - 99 mg/dL    Comment: Glucose reference range applies only to samples taken after fasting for at least 8 hours.   Comment 1 Notify RN   Glucose, capillary     Status: Abnormal   Collection Time: 01/16/22  5:05 PM  Result Value Ref Range   Glucose-Capillary 173 (H) 70 - 99 mg/dL    Comment: Glucose reference range applies only to samples taken after fasting for at least 8 hours.  Glucose, capillary     Status: Abnormal   Collection Time: 01/16/22  9:25 PM  Result Value Ref Range   Glucose-Capillary 162 (H) 70 - 99 mg/dL    Comment: Glucose reference range applies only to samples taken after fasting for at least 8 hours.  Basic metabolic panel     Status: Abnormal   Collection Time: 01/17/22  7:35 AM  Result Value Ref Range   Sodium 140 135 - 145 mmol/L   Potassium 3.9 3.5 - 5.1 mmol/L   Chloride 107 98 - 111 mmol/L   CO2 27  22 - 32 mmol/L   Glucose, Bld 102 (H) 70 - 99 mg/dL    Comment: Glucose reference range applies only to samples taken after fasting for at least 8  hours.   BUN 13 8 - 23 mg/dL   Creatinine, Ser 0.70 0.44 - 1.00 mg/dL   Calcium 8.4 (L) 8.9 - 10.3 mg/dL   GFR, Estimated >60 >60 mL/min    Comment: (NOTE) Calculated using the CKD-EPI Creatinine Equation (2021)    Anion gap 6 5 - 15    Comment: Performed at [facility] [address] [place], [place] [address]  CBC     Status: Abnormal   Collection Time: 01/17/22  7:35 AM  Result Value Ref Range   WBC 7.6 4.0 - 10.5 K/uL   RBC 2.99 (L) 3.87 - 5.11 MIL/uL   Hemoglobin 8.0 (L) 12.0 - 15.0 g/dL   HCT 26.6 (L) 36.0 - 46.0 %   MCV 89.0 80.0 - 100.0 fL   MCH 26.8 26.0 - 34.0 pg   MCHC 30.1 30.0 - 36.0 g/dL   RDW 21.1 (H) 11.5 - 15.5 %   Platelets 465 (H) 150 - 400 K/uL   nRBC 0.0 0.0 - 0.2 %    Comment: Performed at [facility], [place] [address], [place], [place] [address]  Glucose, capillary     Status: None   Collection Time: 01/17/22  8:18 AM  Result Value Ref Range   Glucose-Capillary 87 70 - 99 mg/dL    Comment: Glucose reference range applies only to samples taken after fasting for at least 8 hours.  Glucose, capillary     Status: Abnormal   Collection Time: 01/17/22 12:21 PM  Result Value Ref Range   Glucose-Capillary 170 (H) 70 - 99 mg/dL    Comment: Glucose reference range applies only to samples taken after fasting for at least 8 hours.  Glucose, capillary     Status: Abnormal   Collection Time: 01/17/22  3:26 PM  Result Value Ref Range   Glucose-Capillary 158 (H) 70 - 99 mg/dL    Comment: Glucose reference range applies only to samples taken after fasting for at least 8 hours.   No results found.    Blood pressure (!) 141/61, pulse 77, temperature 98.1 F (36.7 C), temperature source Oral, resp. rate 16, height _0  (1.702 m), weight 87.5 kg, SpO2 99 %.  Medical Problem List and Plan: 1. Functional deficits secondary to L BKA  -patient may shower if incision kept covered  -ELOS/Goals: 7-10 days.  Moderate to intermittent supervision with PT and  OT  -Admit to CIR  -Wound vac was removed, surgery considering leaving sutures in place 4 weeks postop depending on wound healing  -NWB LLE 2.  Antithrombotics: -DVT/anticoagulation:  Pharmaceutical: Eliquis resumed on 10/27  -antiplatelet therapy: Plavix 3. Pain Management:  hydrocodone prn.  4. Mood/Behavior/Sleep:  LCSW to follow for evaluation and support.   -antipsychotic agents:  N?A 5. Neuropsych/cognition: This patient is capable of making decisions on her own behalf. 6. Skin/Wound Care: Monitor wound for healing.   --Protein supplements and vitamins added to promote healing.  7. Fluids/Electrolytes/Nutrition: Monitor I/O. Check CMET on 10/30 8.  CAD: Monitor for symptoms with increase in activity. Continue ASA, Plavix, Lipitor. Nitroglycerin PRN. Denies chest pain 9. Hypotension: Monitor BP TID.  Has been stable with midodrine BID.  10 T2DM: Hgb A1C- 5.4 and well controlled.   --Continue Insulin Glargine will adjust to 22 units from 24  units with SSI for elevated BS.   --Hypoglycemia in am noted--glargine dose decreased slightly, may need HS snack with increase in activity.  11. ABLA on anemia of chronic disease: Stable stable in 7.2->8.0 today.  --Monitor for signs of bleeding as Eliquis and Plavix resumed 10/27.  --h/o Hems (Dr. Collene Mares) -Continue ferrous sulfate 12. PAD: Continue Plavix and lipitor 13. Gastroparesis?: Was on reglan PRN PTA.  14. Constipation  -Sorbitol ordered 15. Hypothyroidism  -Continue synthroid  16. Afib  -Continue Eliquis    Bary Leriche, PA-C 01/17/2022   I have personally performed a face to face diagnostic evaluation of this patient and formulated the key components of the plan.  Additionally, I have personally reviewed laboratory data, imaging studies, as well as relevant notes and concur with the physician assistant's documentation above.  The patient's status has not changed from the original H&P.  Any changes in documentation from the  acute care chart have been noted above.  Jennye Boroughs, MD, [facility]

## 2022-01-17 NOTE — Progress Notes (Signed)
Inpatient Rehabilitation Admission Medication Review by a Pharmacist  A complete drug regimen review was completed for this patient to identify any potential clinically significant medication issues.  High Risk Drug Classes Is patient taking? Indication by Medication  Antipsychotic Yes Compazine - nausea/vomiting  Anticoagulant Yes Apixaban- Afib  Antibiotic No   Opioid No   Antiplatelet Yes Plavix- CAD , PAD  Hypoglycemics/insulin Yes Insulin aspart SSI)- T2DM  Vasoactive Medication Yes Midodrine- Hypotension   Chemotherapy No   Other Yes Atorvastatin- HLD and PAD Farxiga- diabetes, HFrEF   Ferrous sulfate- ABLA on anemia of chronic disease Levothyroxine- hypo- thyroidism Trazodone - for sleep      Type of Medication Issue Identified Description of Issue Recommendation(s)  Drug Interaction(s) (clinically significant)     Duplicate Therapy     Allergy     No Medication Administration End Date     Incorrect Dose     Additional Drug Therapy Needed     Significant med changes from prior encounter (inform family/care partners about these prior to discharge). Patient was taking Insulin glargine-yfgn (semglee) 24 units SQ daily on prior encounter ([facility] acute admission 01/13/22) and on Insulin Glargine 42 units QHS prior to the [facility] acute care admission).  Prior to admission Furosemide '20mg'$  daily as needed for fluid or edema not resumed.  Restart PTA meds when and if necessary during CIR admission or at time of discharge, if warranted    Other       Clinically significant medication issues were identified that warrant physician communication and completion of prescribed/recommended actions by midnight of the next day:  Yes  Name of provider notified for urgent issues identified:  Dr. Curlene Dolphin and Reesa Chew PA  Provider Method of Notification: secure chat message or will discuss with provider on 01/18/22.   Pharmacist comments:   MD's  Inpatient rehab admission  H&P note  indicates  to -Continue Insulin Glargine 24 units with SSI for elevated BS.  However, no order for insulin glargine here.  Also, notes to continue aspirin.  However, patient was not taking aspirin on prior [facility] hospital admission.  Time spent performing this drug regimen review (minutes):  [place], [place] Clinical Pharmacist 01/17/2022 5:24 PM

## 2022-01-18 LAB — GLUCOSE, CAPILLARY
Glucose-Capillary: 133 mg/dL — ABNORMAL HIGH (ref 70–99)
Glucose-Capillary: 158 mg/dL — ABNORMAL HIGH (ref 70–99)
Glucose-Capillary: 137 mg/dL — ABNORMAL HIGH (ref 70–99)
Glucose-Capillary: 120 mg/dL — ABNORMAL HIGH (ref 70–99)

## 2022-01-18 NOTE — Progress Notes (Signed)
PMR Admission Coordinator Pre-Admission Assessment   Patient: Tammy Good is an 71 y.o., female MRN: [id number] DOB: [birth year] Height: 5'7" Weight: 86.4 kg             Insurance Information HMO:     PPO:      PCP:      IPA:      80/20:      OTHER:  PRIMARY: Medicare a and b      Policy#: [id number]      Subscriber: pt Benefits:  Phone #: passport one source online     Name: 10/26 Eff. Date: a 06/22/2017 and b 05/22/2020     Deduct: $1600      Out of Pocket Max: none      Life Max: none CIR: 100%      SNF: 20 full days Outpatient: 80%     Co-Pay: 20% Home Health: 1005      Co-Pay: none DME: 80%     Co-Pay: 20% Providers: in network  SECONDARY: [place]      Policy#: [id number]   Financial Counselor:       Phone#:    The "Data Collection Information Summary" for patients in Inpatient Rehabilitation Facilities with attached "Privacy Act [place] Records" was provided and verbally reviewed with: Patient   Emergency Contact Information Contact Information       Name Relation Home Work [place] Daughter     [phone]    Lum Keas [phone]        Sabino Snipes [phone]        SIMMONS,CHRISTELLE Sister     [phone]    simmons,ebony Daughter     [phone]    Corena Pilgrim     [phone]         Current Medical History  Patient Admitting Diagnosis: L BKA  History of Present Illness: 71 year old female with history of hypothyroidism, CAD s/p PCI to RCA and Lcx, PAD with critical limb ischemia and right SFA stenting, left SFA stenting and anterior tibial angioplasty, HFrEF, atrial fib, IDDM severe septic arthritis. Presented on 01/13/22 for planned BKA.    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 Eliquis and Plavix held due to anemia. ID had followed for months for MRSA bacteremia, and several rounds of prolonged antibiotics.Continue hydralazine for HTN and levothyroxine for hypothyroidism.  Continue Atorvastatin for PAD. Resumed glargine, farxiga and SSI. To continue midodrine for hypotension.    HGB trending down. Transfused 1 unit. Plan transfusion with threshold 7g/dl.   Patient's medical record from [facility] has been reviewed by the rehabilitation admission coordinator and physician.   Past Medical History      Past Medical History:  Diagnosis Date   CAD S/P percutaneous coronary angioplasty 08/2013    100% mRCA - PCI Integrity Resolute DES 3.0 mm x 38 mm - 3.35 mm; PTCA of RPA V 2.0 mm x 15 mm   CHF (congestive heart failure) (HCC)     Cholesteatoma      right   Diabetes mellitus type 2 in obese (HCC)      On insulin and Invokana   History of osteomyelitis L 5th Toe all 05/2019    s/p Partial Ray Amputation with partial closure; 6 wks Abx & LSFA Atherectomy/DEB PTA with Stent for focal dissection.   Hyperlipidemia with target LDL less than 70     Hypothyroidism (acquired)     Mild essential hypertension  Obesity (BMI 30-39.9) 11/17/2013   PAD (peripheral artery disease) ([place]) 05/26/2019    05/26/19: Abd AoGram- BLE runoff -> L SFA orbital atherectomy - PTA w/ DEB & Stent 6 x 40 Luttonix (for focal dissection) - patent Pop A with 3 V runoff. LEA Dopplers 01/03/2020: RABI (prev) 0.68 (0.53)/ RTBI (prev) 0.58 (0.33); LABI (prev) 0.80 (0.64), LTBI (prev) 0.64 (0.51); R mSFA ~50-74%, L mSFA 50-74%. Patent Prox SFA stent < 49% stenosis   Panhypopituitarism (HCC)     ST elevation myocardial infarction (STEMI) of inferior wall, subsequent episode of care ([place]) 08/2013    80% branch of D1, 40% mid AV groove circumflex, 100% RCA with subacute thrombus -- thrombus extending into RPA V with 100% occlusion after initial angioplasty of mid RCA ;; Post MI ECHO 6/9/'15: EF 50-55%, mild LVH with moderate HK of inferior wall, Gr1 DD, mild LA dilation; mildly reduced RV function    Has the patient had major surgery during 100 days prior to admission? Yes   Family History    family history includes Alzheimer's disease in her maternal grandmother; Cancer in her sister; Cancer (age of onset: 45) in her mother; Heart attack (age of onset: 44) in her father.   Current Medications   Current Facility-Administered Medications:    0.9 %  sodium chloride infusion (Manually program via Guardrails IV Fluids), , Intravenous, Once, Martin[place], Jesse J, PA-C   acetaminophen (TYLENOL) tablet 325-650 mg, 325-650 mg, Oral, Q6H PRN, Martin[place], Jesse J, PA-C, 650 mg at 01/15/22 2147   apixaban (ELIQUIS) tablet 5 mg, 5 mg, Oral, BID, Kc, Ramesh, MD, 5 mg at 01/17/22 1023   atorvastatin (LIPITOR) tablet 40 mg, 40 mg, Oral, q1800, [place], Jesse J, PA-C, 40 mg at 01/16/22 1740   clopidogrel (PLAVIX) tablet 75 mg, 75 mg, Oral, Daily, Kc, Ramesh, MD, 75 mg at 01/17/22 1023   dapagliflozin propanediol (FARXIGA) tablet 10 mg, 10 mg, Oral, q morning, [place], Jesse J, PA-C, 10 mg at 01/17/22 0800   dexamethasone (DECADRON) tablet 0.5 mg, 0.5 mg, Oral, Daily, Martin[place], Jesse J, PA-C, 0.5 mg at 01/17/22 0800   diphenhydrAMINE (BENADRYL) 12.5 MG/5ML elixir 12.5-25 mg, 12.5-25 mg, Oral, Q4H PRN, Martin[place], Jesse J, PA-C   docusate sodium (COLACE) capsule 100 mg, 100 mg, Oral, BID, Martinique, Jesse J, PA-C, 100 mg at 01/17/22 0801   ferrous sulfate tablet 325 mg, 325 mg, Oral, QHS, Martin[place], Jesse J, PA-C, 325 mg at 01/16/22 2125   furosemide (LASIX) tablet 20 mg, 20 mg, Oral, Daily PRN, Martin[place], Jesse J, PA-C   hydrALAZINE (APRESOLINE) injection 5 mg, 5 mg, Intravenous, Q4H PRN, [place], Jesse J, PA-C   HYDROcodone-acetaminophen (NORCO) 7.5-325 MG per tablet 1-2 tablet, 1-2 tablet, Oral, Q4H PRN, [place]ique, Jesse J, PA-C, 1 tablet at 01/16/22 2125   HYDROcodone-acetaminophen (NORCO/VICODIN) 5-325 MG per tablet 1-2 tablet, 1-2 tablet, Oral, Q4H PRN, [place], Jesse J, PA-C, 1 tablet at 01/17/22 0800   insulin aspart (novoLOG) injection 0-15 Units, 0-15 Units, Subcutaneous, TID PC & HS, Martinique, Jesse J, PA-C, 3 Units at  01/16/22 1740   insulin glargine-yfgn (SEMGLEE) injection 24 Units, 24 Units, Subcutaneous, Daily, Danford, Suann Larry, MD, 24 Units at 01/16/22 2100   levothyroxine (SYNTHROID) tablet 125 mcg, 125 mcg, Oral, Q0600, [place], Jesse J, PA-C, 125 mcg at 01/17/22 9604   methocarbamol (ROBAXIN) tablet 500 mg, 500 mg, Oral, Q6H PRN, 500 mg at 01/15/22 0240 **OR** methocarbamol (ROBAXIN) 500 mg in dextrose 5 % 50 mL IVPB, 500 mg, Intravenous, Q6H PRN,  [place], Jesse J, PA-C   metoCLOPramide (REGLAN) tablet 5-10 mg, 5-10 mg, Oral, Q8H PRN **OR** metoCLOPramide (REGLAN) injection 5-10 mg, 5-10 mg, Intravenous, Q8H PRN, [place], Jesse J, PA-C   midodrine (PROAMATINE) tablet 5 mg, 5 mg, Oral, BID WC, Martinique, Jesse J, PA-C, 5 mg at 01/17/22 0800   morphine (PF) 2 MG/ML injection 0.5-1 mg, 0.5-1 mg, Intravenous, Q2H PRN, [place], Jesse J, PA-C   nitroGLYCERIN (NITROSTAT) SL tablet 0.4 mg, 0.4 mg, Sublingual, Q5 Min x 3 PRN, [place], Jesse J, PA-C   nystatin cream (MYCOSTATIN) 1 Application, 1 Application, Topical, BID PRN, [place], Jesse J, PA-C   ondansetron [facility]) tablet 4 mg, 4 mg, Oral, Q6H PRN **OR** ondansetron (ZOFRAN) injection 4 mg, 4 mg, Intravenous, Q6H PRN, [place], Jesse J, PA-C   pantoprazole (PROTONIX) EC tablet 40 mg, 40 mg, Oral, Daily, [place], Jesse J, PA-C, 40 mg at 01/17/22 0800   polyethylene glycol (MIRALAX / GLYCOLAX) packet 17 g, 17 g, Oral, Daily PRN, [place], Jesse J, PA-C   Patients Current Diet:  Diet Order                  Diet heart healthy/carb modified Room service appropriate? Yes; Fluid consistency: Thin  Diet effective now                       Precautions / Restrictions Precautions Precautions: Fall Restrictions Weight Bearing Restrictions: Yes LLE Weight Bearing: Non weight bearing    Has the patient had 2 or more falls or a fall with injury in the past year? No   Prior Activity Level Limited Community (1-2x/wk): mod I with cane, driving, retired   Prior  Functional Level Self Care: Did the patient need help bathing, dressing, using the toilet or eating? Independent   Indoor Mobility: Did the patient need assistance with walking from room to room (with or without device)? Independent   Stairs: Did the patient need assistance with internal or external stairs (with or without device)? Independent   Functional Cognition: Did the patient need help planning regular tasks such as shopping or remembering to take medications? Independent   Patient Information Are you of Hispanic, Latino/a,or Spanish origin?: A. No, not of Hispanic, Latino/a, or Spanish origin What is your race?: B. Black or African American Do you need or want an interpreter to communicate with a doctor or health care staff?: 0. No   Patient's Response To:  Health Literacy and Transportation Is the patient able to respond to health literacy and transportation needs?: Yes Health Literacy - How often do you need to have someone help you when you read instructions, pamphlets, or other written material from your doctor or pharmacy?: Never In the past 12 months, has lack of transportation kept you from medical appointments or from getting medications?: No In the past 12 months, has lack of transportation kept you from meetings, work, or from getting things needed for daily living?: No   Development worker, international aid / Equipment Home Equipment: Shower seat, Radio producer - single point, Conservation officer, nature (2 wheels), Hand held shower head, Toilet riser, BSC/3in1   Prior Device Use: Indicate devices/aids used by the patient prior to current illness, exacerbation or injury?  cane   Current Functional Level Cognition   Overall Cognitive Status: Within Functional Limits for tasks assessed Orientation Level: Oriented X4    Extremity Assessment (includes Sensation/Coordination)   Upper Extremity Assessment: Overall WFL for tasks assessed  Lower Extremity Assessment: Defer to PT evaluation  ADLs    Overall ADL's : Needs assistance/impaired Eating/Feeding: Independent, Bed level Grooming: Wash/dry hands, Wash/dry face, Oral care, Sitting, Supervision/safety Upper Body Bathing: Supervision/ safety, Sitting Lower Body Bathing: Minimal assistance, Bed level, Moderate assistance Upper Body Dressing : Min guard, Sitting Lower Body Dressing: Moderate assistance, Bed level, Minimal assistance Toilet Transfer: Minimal assistance, +2 for safety/equipment, Stand-pivot Toileting- Clothing Manipulation and Hygiene: Minimal assistance, +2 for safety/equipment, Sit to/from stand     Mobility   Overal bed mobility: Needs Assistance Bed Mobility: Rolling, Supine to Sit Rolling: Supervision Supine to sit: Supervision General bed mobility comments: supervision for safety     Transfers   Overall transfer level: Needs assistance Equipment used: Rolling walker (2 wheels) Transfers: Sit to/from Stand, Bed to chair/wheelchair/BSC Sit to Stand: Min assist Bed to/from chair/wheelchair/BSC transfer type:: Lateral/scoot transfer Stand pivot transfers: Min assist  Lateral/Scoot Transfers: Min assist General transfer comment: minA to scoot laterally from elevated bed to R side, then completed repeated sit-stand transfers with heavy dependence on BUE support and minA to steady with transition from UE on armrests to RW.     Ambulation / Gait / Stairs / Wheelchair Mobility   Ambulation/Gait Ambulation/Gait assistance: Herbalist (Feet): 4 Feet Assistive device: Rolling walker (2 wheels) General Gait Details: hop-to pattern with minA to steady. pt limited by fatigue and pain in RLE with hopping Gait velocity: decreased Gait velocity interpretation: <1.31 ft/sec, indicative of household ambulator     Posture / Balance Balance Overall balance assessment: Needs assistance Sitting-balance support: No upper extremity supported, Feet supported Sitting balance-Leahy Scale: Good Standing balance  support: Bilateral upper extremity supported, Reliant on assistive device for balance Standing balance-Leahy Scale: Poor Standing balance comment: reliant on UE support     Special needs/care consideration Wound vac postoperative    Previous Home Environment  Living Arrangements: Alone  Lives With: Alone Available Help at Discharge:  (granddaughter, Delight Hoh, moving in . alot of family to asisst) Type of Home: House Home Layout: One level Home Access: Stairs to enter Entrance Stairs-Rails: Left Entrance Stairs-Number of Steps: 3-4 at the front, 4 through the garage (typical entrance) Bathroom Shower/Tub: Multimedia programmer: Standard Bathroom Accessibility: No Home Care Services: No Additional Comments: pt unable to get off toilet by herself with RW in front of her.   Discharge Living Setting Plans for Discharge Living Setting: Patient's home Delight Hoh, granddaughter to move in) Type of Home at Discharge: House Discharge Home Layout: One level Discharge Home Access: Stairs to enter Entrance Stairs-Rails: Left Entrance Stairs-Number of Steps: 3-4 in front and 4 in back. looking into ramp Discharge Bathroom Shower/Tub: Walk-in shower Discharge Bathroom Toilet: Standard Discharge Bathroom Accessibility: Yes How Accessible: Accessible via walker Does the patient have any problems obtaining your medications?: No   Social/Family/Support Systems Contact Information: daughter Kristen Loader and Granddaughter, Delight Hoh Anticipated Caregiver: multiple family members Anticipated Caregiver's Contact Information: see contacts Ability/Limitations of Caregiver: Lazaria at school for OT but other family can asisst also Caregiver Availability: Intermittent Discharge Plan Discussed with Primary Caregiver: Yes Is Caregiver In Agreement with Plan?: Yes Does Caregiver/Family have Issues with Lodging/Transportation while Pt is in Rehab?: Yes   Goals Patient/Family Goal for Rehab: Mod I to  intermittent supervision with PT and OT Expected length of stay: ELOS 7 to 10 days Pt/Family Agrees to Admission and willing to participate: Yes Program Orientation Provided & Reviewed with Pt/Caregiver Including Roles  & Responsibilities: Yes   Decrease burden of Care through IP rehab admission:  n/a   Possible need for SNF placement upon discharge: not anticipated   Patient Condition: I have reviewed medical records from [facility], spoken with CM, and patient. I met with patient at the bedside for inpatient rehabilitation assessment.  Patient will benefit from ongoing PT and OT, can actively participate in 3 hours of therapy a day 5 days of the week, and can make measurable gains during the admission.  Patient will also benefit from the coordinated team approach during an Inpatient Acute Rehabilitation admission.  The patient will receive intensive therapy as well as Rehabilitation physician, nursing, social worker, and care management interventions.  Due to bladder management, bowel management, safety, skin/wound care, disease management, medication administration, pain management, and patient education the patient requires 24 hour a day rehabilitation nursing.  The patient is currently min asssit with mobility and min to mod assist with basic ADLs.  Discharge setting and therapy post discharge at home with home health is anticipated.  Patient has agreed to participate in the Acute Inpatient Rehabilitation Program and will admit today.   Preadmission Screen Completed By: Danne Baxter RN MSN with updates by  Retta Diones, 01/17/2022 11:18 AM ______________________________________________________________________   Discussed status with Dr. Curlene Dolphin on 01/17/22 at 0945 and received approval for admission today.   Admission Coordinator:  Danne Baxter RN MSN with updates by Retta Diones, RN, time 1117/Date 01/17/22    Assessment/Plan: Diagnosis: L BKA Does the need for close,  24 hr/day Medical supervision in concert with the patient's rehab needs make it unreasonable for this patient to be served in a less intensive setting? Yes Co-Morbidities requiring supervision/potential complications: Hypothyroidism, PAD, DM2, Anemia, Afib, CAD, hypotension Due to bladder management, bowel management, safety, skin/wound care, disease management, medication administration, pain management, and patient education, does the patient require 24 hr/day rehab nursing? Yes Does the patient require coordinated care of a physician, rehab nurse, PT, OT, and SLP to address physical and functional deficits in the context of the above medical diagnosis(es)? Yes Addressing deficits in the following areas: balance, endurance, locomotion, strength, transferring, bowel/bladder control, bathing, dressing, feeding, grooming, toileting, and psychosocial support Can the patient actively participate in an intensive therapy program of at least 3 hrs of therapy 5 days a week? Yes The potential for patient to make measurable gains while on inpatient rehab is excellent Anticipated functional outcomes upon discharge from inpatient rehab: modified independent and supervision PT, modified independent and supervision OT, n/a SLP Estimated rehab length of stay to reach the above functional goals is: 7-10 days Anticipated discharge destination: Home 10. Overall Rehab/Functional Prognosis: excellent     MD Signature: Jennye Boroughs

## 2022-01-18 NOTE — Plan of Care (Signed)
  Problem: RH Balance Goal: LTG Patient will maintain dynamic standing with ADLs (OT) Description: LTG:  Patient will maintain dynamic standing balance with assist during activities of daily living (OT)  Flowsheets (Taken 01/18/2022 0915) LTG: Pt will maintain dynamic standing balance during ADLs with: Supervision/Verbal cueing   Problem: Sit to Stand Goal: LTG:  Patient will perform sit to stand in prep for activites of daily living with assistance level (OT) Description: LTG:  Patient will perform sit to stand in prep for activites of daily living with assistance level (OT) Flowsheets (Taken 01/18/2022 0915) LTG: PT will perform sit to stand in prep for activites of daily living with assistance level: Supervision/Verbal cueing   Problem: RH Bathing Goal: LTG Patient will bathe all body parts with assist levels (OT) Description: LTG: Patient will bathe all body parts with assist levels (OT) Flowsheets (Taken 01/18/2022 0915) LTG: Pt will perform bathing with assistance level/cueing: Supervision/Verbal cueing   Problem: RH Dressing Goal: LTG Patient will perform lower body dressing w/assist (OT) Description: LTG: Patient will perform lower body dressing with assist, with/without cues in positioning using equipment (OT) Flowsheets (Taken 01/18/2022 0915) LTG: Pt will perform lower body dressing with assistance level of: Supervision/Verbal cueing   Problem: RH Toileting Goal: LTG Patient will perform toileting task (3/3 steps) with assistance level (OT) Description: LTG: Patient will perform toileting task (3/3 steps) with assistance level (OT)  Flowsheets (Taken 01/18/2022 0915) LTG: Pt will perform toileting task (3/3 steps) with assistance level: Supervision/Verbal cueing   Problem: RH Toilet Transfers Goal: LTG Patient will perform toilet transfers w/assist (OT) Description: LTG: Patient will perform toilet transfers with assist, with/without cues using equipment (OT) Flowsheets  (Taken 01/18/2022 0915) LTG: Pt will perform toilet transfers with assistance level of: Supervision/Verbal cueing   Problem: RH Tub/Shower Transfers Goal: LTG Patient will perform tub/shower transfers w/assist (OT) Description: LTG: Patient will perform tub/shower transfers with assist, with/without cues using equipment (OT) Flowsheets (Taken 01/18/2022 0915) LTG: Pt will perform tub/shower stall transfers with assistance level of: Supervision/Verbal cueing

## 2022-01-18 NOTE — Progress Notes (Signed)
Occupational Therapy Assessment and Plan  Patient Details  Name: Tammy Good MRN: [id number] Date of Birth: 30-Aug-1950  OT Diagnosis: acute pain and muscle weakness (generalized), new BKA Rehab Potential: Rehab Potential (ACUTE ONLY): Excellent ELOS: 7-10   Today's Date: 01/18/2022 OT Individual Time: 2426-8341 OT Individual Time Calculation (min): 70 min     Hospital Problem: Principal Problem:   Unilateral complete BKA, left, initial encounter [facility])   Past Medical History:  Past Medical History:  Diagnosis Date   CAD S/P percutaneous coronary angioplasty 08/2013   100% mRCA - PCI Integrity Resolute DES 3.0 mm x 38 mm - 3.35 mm; PTCA of RPA V 2.0 mm x 15 mm   CHF (congestive heart failure) ([place])    Cholesteatoma    right   Diabetes mellitus type 2 in obese (HCC)    On insulin and Invokana   History of osteomyelitis L 5th Toe all 05/2019   s/p Partial Ray Amputation with partial closure; 6 wks Abx & LSFA Atherectomy/DEB PTA with Stent for focal dissection.   Hyperlipidemia with target LDL less than 70    Hypothyroidism (acquired)    Mild essential hypertension    Obesity (BMI 30-39.9) 11/17/2013   PAD (peripheral artery disease) ([place]) 05/26/2019   05/26/19: Abd AoGram- BLE runoff -> L SFA orbital atherectomy - PTA w/ DEB & Stent 6 x 40 Luttonix (for focal dissection) - patent Pop A with 3 V runoff. LEA Dopplers 01/03/2020: RABI (prev) 0.68 (0.53)/ RTBI (prev) 0.58 (0.33); LABI (prev) 0.80 (0.64), LTBI (prev) 0.64 (0.51); R mSFA ~50-74%, L mSFA 50-74%. Patent Prox SFA stent < 49% stenosis   Panhypopituitarism (HCC)    ST elevation myocardial infarction (STEMI) of inferior wall, subsequent episode of care ([place]) 08/2013   80% branch of D1, 40% mid AV groove circumflex, 100% RCA with subacute thrombus -- thrombus extending into RPA V with 100% occlusion after initial angioplasty of mid RCA ;; Post MI ECHO 6/9/'15: EF 50-55%, mild LVH with moderate HK of inferior wall, Gr1 DD, mild  LA dilation; mildly reduced RV function   Past Surgical History:  Past Surgical History:  Procedure Laterality Date   ABDOMINAL AORTOGRAM W/LOWER EXTREMITY N/A 05/26/2019   Procedure: ABDOMINAL AORTOGRAM W/LOWER EXTREMITY;  Surgeon: Marty Heck, MD;  Location: [place] [facility];  Service: Cardiovascular;  Laterality: N/A;   ABDOMINAL AORTOGRAM W/LOWER EXTREMITY N/A 11/15/2020   Procedure: ABDOMINAL AORTOGRAM W/LOWER EXTREMITY;  Surgeon: Marty Heck, MD;  Location: [place] [facility];  Service: Cardiovascular;  Laterality: N/A;   ABDOMINAL AORTOGRAM W/LOWER EXTREMITY N/A 04/18/2021   Procedure: ABDOMINAL AORTOGRAM W/LOWER EXTREMITY;  Surgeon: Marty Heck, MD;  Location: [place] [facility];  Service: Cardiovascular;  Laterality: N/A;   ABDOMINAL AORTOGRAM W/LOWER EXTREMITY Left 07/11/2021   Procedure: ABDOMINAL AORTOGRAM W/LOWER EXTREMITY;  Surgeon: Marty Heck, MD;  Location: [place] [facility];  Service: Cardiovascular;  Laterality: Left;   AMPUTATION Left 05/25/2019   Procedure: AMPUTATION RAY 5th;  Surgeon: Trula Slade, DPM;  Location: [place];  Service: Podiatry;  Laterality: Left;   AMPUTATION Left 01/14/2022   Procedure: LEFT BELOW THE KNEE AMPUTATION;  Surgeon: Erle Crocker, MD;  Location: [place];  Service: Orthopedics;  Laterality: Left;  LENGTH OF SURGERY: 90 MINUTES   APPLICATION OF WOUND VAC Left 01/14/2022   Procedure: WOUND VAC PLACEMENT;  Surgeon: Erle Crocker, MD;  Location: [place];  Service: Orthopedics;  Laterality: Left;   BONE BIOPSY Left 04/24/2021  Procedure: BONE BIOPSY;  Surgeon: Landis Martins, DPM;  Location: [place];  Service: Podiatry;  Laterality: Left;  left ankle/fibula   BONE BIOPSY Left 07/15/2021   Procedure: LEFT FOOT BONE BIOPSY;  Surgeon: Trula Slade, DPM;  Location: [place];  Service: Podiatry;  Laterality: Left;   Cardiac Event Monitor  July-August 2015   Sinus rhythm with PVCs   CHOLECYSTECTOMY      COLONOSCOPY N/A 08/31/2013   Procedure: COLONOSCOPY;  Surgeon: Juanita Craver, MD;  Location: [facility] ENDOSCOPY;  Service: Endoscopy;  Laterality: N/A;   CORONARY STENT INTERVENTION N/A 11/19/2020   Procedure: CORONARY STENT INTERVENTION;  Surgeon: Nelva Bush, MD;  Location: [place] [facility];  Service: Cardiovascular;  Laterality: N/A;   ESOPHAGOGASTRODUODENOSCOPY N/A 09/01/2013   Procedure: ESOPHAGOGASTRODUODENOSCOPY (EGD);  Surgeon: Beryle Beams, MD;  Location: [facility] ENDOSCOPY;  Service: Endoscopy;  Laterality: N/A;  bedside   ESOPHAGOGASTRODUODENOSCOPY (EGD) WITH PROPOFOL N/A 05/26/2021   Procedure: ESOPHAGOGASTRODUODENOSCOPY (EGD) WITH PROPOFOL;  Surgeon: Carol Ada, MD;  Location: [place];  Service: Gastroenterology;  Laterality: N/A;   EYE SURGERY Bilateral    bilateral cataracts   INCISION AND DRAINAGE Left 04/24/2021   Procedure: INCISION AND DRAINAGE;  Surgeon: Landis Martins, DPM;  Location: [place];  Service: Podiatry;  Laterality: Left;   IRRIGATION AND DEBRIDEMENT FOOT Left 07/15/2021   Procedure: IRRIGATION AND DEBRIDEMENT FOOT;  Surgeon: Trula Slade, DPM;  Location: [place];  Service: Podiatry;  Laterality: Left;   LEFT HEART CATH AND CORONARY ANGIOGRAPHY N/A 11/19/2020   Procedure: LEFT HEART CATH AND CORONARY ANGIOGRAPHY;  Surgeon: Nelva Bush, MD;  Location: [place] [facility];  Service: Cardiovascular;  Laterality: N/A;   LEFT HEART CATHETERIZATION WITH CORONARY ANGIOGRAM N/A 08/30/2013   Procedure: LEFT HEART CATHETERIZATION WITH CORONARY ANGIOGRAM;  Surgeon: Leonie Man, MD;  Location: [facility] CATH LAB: 100% mRCA (thrombus - extends to RPAV), 80% D1, 40% AVG Cx.   PERCUTANEOUS CORONARY STENT INTERVENTION (PCI-S)  08/30/2013   Procedure: PERCUTANEOUS CORONARY STENT INTERVENTION (PCI-S);  Surgeon: Leonie Man, MD;  Location: [facility] CATH LAB;  Integrity Resolute DES 2.0 mm x 38 mm -- 3.35 mm.; PTCA of proximal RPA V. - 3.0 mm x 15 mm balloon   PERIPHERAL  VASCULAR ATHERECTOMY  05/26/2019   Procedure: PERIPHERAL VASCULAR ATHERECTOMY;  Surgeon: Marty Heck, MD;  Location: [place] [facility];  Service: Cardiovascular;;  Left SFA   PERIPHERAL VASCULAR BALLOON ANGIOPLASTY Left 07/11/2021   Procedure: PERIPHERAL VASCULAR BALLOON ANGIOPLASTY;  Surgeon: Marty Heck, MD;  Location: [place] [facility];  Service: Cardiovascular;  Laterality: Left;  PT TRUNK / AT   PERIPHERAL VASCULAR INTERVENTION  05/26/2019   Procedure: PERIPHERAL VASCULAR INTERVENTION;  Surgeon: Marty Heck, MD;  Location: [place] [facility];  Service: Cardiovascular;;  Left SFA   PERIPHERAL VASCULAR INTERVENTION Right 11/15/2020   Procedure: PERIPHERAL VASCULAR INTERVENTION;  Surgeon: Marty Heck, MD;  Location: [place] [facility];  Service: Cardiovascular;  Laterality: Right;  Superficial Femoral Artery   PERIPHERAL VASCULAR INTERVENTION Left 07/11/2021   Procedure: PERIPHERAL VASCULAR INTERVENTION;  Surgeon: Marty Heck, MD;  Location: [place] [facility];  Service: Cardiovascular;  Laterality: Left;  SFA   PITUITARY SURGERY     TEE WITHOUT CARDIOVERSION N/A 04/26/2021   Procedure: TRANSESOPHAGEAL ECHOCARDIOGRAM (TEE);  Surgeon: Buford Dresser, MD;  Location: [facility] ENDOSCOPY;  Service: Cardiovascular;  Laterality: N/A;   TRANSTHORACIC ECHOCARDIOGRAM  08/30/2013   mild LVH. EF 50-55%. Moderate HK of the  entire inferior myocardium. GR 1 DD. Mild LA dilation. Mildly reduced RV function   TYMPANOMASTOIDECTOMY Right 12/28/2017   Procedure: RIGHT TYMPANOMASTOIDECTOMY;  Surgeon: Leta Baptist, MD;  Location: [place];  Service: ENT;  Laterality: Right;    Assessment & Plan Clinical Impression: Patient is a 71 y.o. year old female with recent admission to the hospital on  01/14/22 following L BKA due to septic arthritis of L talonavicular joint with associated osteomyelitis. PMH: CAD s/p PCI to RCA, PAD, HRrEF, AFib, T2DM, septic  arthritis. Patient transferred to CIR on 01/17/2022 .    Patient currently requires mod with basic self-care skills secondary to muscle weakness, decreased cardiorespiratoy endurance, and decreased standing balance, decreased postural control, decreased balance strategies, and difficulty maintaining precautions.  Prior to hospitalization, patient could complete BADL with modified independent .  Patient will benefit from skilled intervention to increase independence with basic self-care skills prior to discharge home with care partner.  Anticipate patient will require 24 hour supervision and follow up home health.  OT - End of Session Endurance Deficit: Yes Endurance Deficit Description: Rest breaks within BADL tasks OT Assessment Rehab Potential (ACUTE ONLY): Excellent OT Patient demonstrates impairments in the following area(s): Balance;Endurance;Edema;Motor;Pain;Safety;Skin Integrity OT Basic ADL's Functional Problem(s): Bathing;Dressing;Toileting OT Transfers Functional Problem(s): Toilet;Tub/Shower OT Additional Impairment(s): None OT Plan OT Intensity: Minimum of 1-2 x/day, 45 to 90 minutes OT Frequency: 5 out of 7 days OT Duration/Estimated Length of Stay: 7-10 OT Treatment/Interventions: Balance/vestibular training;Cognitive remediation/compensation;Community reintegration;Discharge planning;Disease mangement/prevention;DME/adaptive equipment instruction;Functional electrical stimulation;Functional mobility training;Neuromuscular re-education;Pain management;Patient/family education;Psychosocial support;Self Care/advanced ADL retraining;Skin care/wound managment;Splinting/orthotics;Therapeutic Activities;Therapeutic Exercise;UE/LE Strength taining/ROM;UE/LE Coordination activities;Visual/perceptual remediation/compensation;Wheelchair propulsion/positioning OT Self Feeding Anticipated Outcome(s): n/a-alreafy independent OT Basic Self-Care Anticipated Outcome(s): supervision OT Toileting  Anticipated Outcome(s): supervision OT Bathroom Transfers Anticipated Outcome(s): supervision OT Recommendation Patient destination: Home Follow Up Recommendations: Home health OT Equipment Recommended: To be determined Equipment Details: has RW, shower chair, SPC, adn BSC   OT Evaluation Precautions/Restrictions  Precautions Precautions: Fall Restrictions Weight Bearing Restrictions: Yes LLE Weight Bearing: Non weight bearing Pain  6/10 L residual limb pain- rest and repositioned Home Living/Prior [place] expects to be discharged to:: Private residence Living Arrangements: Alone (possible granddaughter moving in) Available Help at Discharge: Family (granddaughter, Delight Hoh, moving in . alot of family to asisst) Type of Home: House Home Access: Stairs to enter [employer] of Steps: 3-4 at the front, 4 through the garage (typical entrance) Entrance Stairs-Rails: Left Home Layout: One level Bathroom Shower/Tub: Multimedia programmer: Standard Bathroom Accessibility: No Additional Comments: has BSC, shower seat with back, RW, and SPC  Lives With: Alone Prior Function Level of Independence: Independent with basic ADLs, Independent with homemaking with ambulation, Requires assistive device for independence Driving: Yes Vision Baseline Vision/History: 0 No visual deficits Ability to See in Adequate Light: 0 Adequate Patient Visual Report: No change from baseline Perception  Perception: Within Functional Limits Praxis Praxis: Intact Cognition Cognition Overall Cognitive Status: Within Functional Limits for tasks assessed Arousal/Alertness: Awake/alert Orientation Level: Person;Situation;Place Person: Oriented Place: Oriented Situation: Oriented Memory: Appears intact Awareness: Appears intact Problem Solving: Appears intact Safety/Judgment: Appears intact Brief Interview for Mental Status (BIMS) Repetition of Three  Words (First Attempt): 3 Temporal Orientation: Year: Correct Temporal Orientation: Month: Accurate within 5 days Temporal Orientation: Day: Correct Recall: "Sock": Yes, no cue required Recall: "Blue": Yes, no cue required Recall: "Bed": Yes, no cue required BIMS Summary Score: 15 Sensation Sensation Light Touch: Appears Intact Coordination Fine Motor Movements  are Fluid and Coordinated: Yes Balance Balance Balance Assessed: Yes Dynamic Sitting Balance Dynamic Sitting - Balance Support: During functional activity Dynamic Sitting - Level of Assistance: 5: Stand by assistance Static Standing Balance Static Standing - Balance Support: During functional activity Static Standing - Level of Assistance: 4: Min assist Dynamic Standing Balance Dynamic Standing - Balance Support: During functional activity Dynamic Standing - Level of Assistance: 3: Mod assist Extremity/Trunk Assessment RUE Assessment RUE Assessment: Within Functional Limits LUE Assessment LUE Assessment: Within Functional Limits  Care Tool Care Tool Self Care Eating   Eating Assist Level: Independent    Oral Care    Oral Care Assist Level: Independent    Bathing   Body parts bathed by patient: Left arm;Right arm;Chest;Abdomen;Front perineal area;Left upper leg;Right upper leg;Face Body parts bathed by helper: Right lower leg;Buttocks Body parts n/a: Left lower leg Assist Level: Minimal Assistance - Patient > 75%    Upper Body Dressing(including orthotics)   What is the patient wearing?: Pull over shirt   Assist Level: Contact Guard/Touching assist    Lower Body Dressing (excluding footwear)   What is the patient wearing?: Pants;Incontinence brief Assist for lower body dressing: Moderate Assistance - Patient 50 - 74%    Putting on/Taking off footwear   What is the patient wearing?: Shoes;Socks Assist for footwear: Maximal Assistance - Patient 25 - 49%       Care Tool Toileting Toileting activity    Assist for toileting: Maximal Assistance - Patient 25 - 49%     Care Tool Bed Mobility Roll left and right activity        Sit to lying activity        Lying to sitting on side of bed activity   Lying to sitting on side of bed assist level: the ability to move from lying on the back to sitting on the side of the bed with no back support.: Minimal Assistance - Patient > 75%     Care Tool Transfers Sit to stand transfer   Sit to stand assist level: Minimal Assistance - Patient > 75%    Chair/bed transfer   Chair/bed transfer assist level: Minimal Assistance - Patient > 75%     Toilet transfer   Assist Level: Minimal Assistance - Patient > 75%     Care Tool Cognition  Expression of Ideas and Wants Expression of Ideas and Wants: 4. Without difficulty (complex and basic) - expresses complex messages without difficulty and with speech that is clear and easy to understand  Understanding Verbal and Non-Verbal Content Understanding Verbal and Non-Verbal Content: 4. Understands (complex and basic) - clear comprehension without cues or repetitions   Memory/Recall Ability Memory/Recall Ability : Current season;Location of own room;Staff names and faces;That he or she is in a hospital/hospital unit   Refer to Care Plan for [place] 1 OT Short Term Goal 1 (Week 1): LTG=STG 2/2 ELOS  Recommendations for other services: None    Skilled Therapeutic Intervention OT eval completed addressing rehab process, OT purpose, POC, ELOS, and goals.  Pt completed squat-pivot transfer with min A to drop arm wc w/ min A and stand-pivots w/ RW w/ min A. BADL tasks completed sit<>stand from wc at the sink with min/mod A for dynamic balance and OT assist with washing lower body. OT reviewed residual limb care and edema management. Wrapped L residual limb with ACE wrap for improved distal compression. Pt completed L knee extension exercises, then pivoted to  BSC. PT with successful  BM and voided bladder. Max A for peri-care in standing. Pt left seated in recliner with nursing present and needs met.  See below for further details regarding BADL performance.    ADL ADL Eating: Independent Grooming: Independent Upper Body Bathing: Setup Lower Body Bathing: Moderate assistance Upper Body Dressing: Contact guard Lower Body Dressing: Moderate assistance;Maximal assistance Toileting: Maximal assistance Toilet Transfer: Minimal assistance Toilet Transfer Method: Stand pivot Toilet Transfer Equipment: Bedside commode Mobility  Bed Mobility Bed Mobility: Supine to Sit Supine to Sit: Minimal Assistance - Patient > 75% Transfers Sit to Stand: Minimal Assistance - Patient > 75% Stand to Sit: Minimal Assistance - Patient > 75%   Discharge Criteria: Patient will be discharged from OT if patient refuses treatment 3 consecutive times without medical reason, if treatment goals not met, if there is a change in medical status, if patient makes no progress towards goals or if patient is discharged from hospital.  The above assessment, treatment plan, treatment alternatives and goals were discussed and mutually agreed upon: by patient  Valma Cava 01/18/2022, 9:28 AM

## 2022-01-18 NOTE — Progress Notes (Signed)
Physical Therapy Session Note  Patient Details  Name: Tammy Good MRN: [id number] Date of Birth: 01/19/1951  Today's Date: 01/18/2022 PT Individual Time: 1635-1730 PT Individual Time Calculation (min): 66mn   Short Term Goals: Week 1:  PT Short Term Goal 1 (Week 1): [facility] Short Term Goal 2 (Week 1): Pt will ambulate 138fwith min assist and LRAD PT Short Term Goal 3 (Week 1): pt will perform stair management with use of shower chair  Skilled Therapeutic Interventions/Progress Updates:   Pt received supine in bed and agreeable to PT. Supine>sit transfer with supervision assist and cues for use of rails as needed. [facility].   WC mobility with supervision assist and cues for increased use of LUE to prevent veer to the L [id number]  PT performed re-wrap of residual limb.   Seated UE therex: mid row 3 x 12 green tband. High row 3 x 12 green tband. Tricep extension 3 x 12 green tband. Shoulder diagonal red tband 2 x 10. Cues for decreased speed and proper BUE positining to improve targeted muscle activation   Sit<>stand in the parallel bars x 3 with min-supervision assist with cues for weight shift over the RLE. .   Marland KitchenPartial squat 2 x 5 with emphasis on scapular depression and tricep activation in erect position to improve UE stability and strength when performing standing activities.   Pt returned to room and performed stand pivot transfer to bed with RW and min assist. Sit>supine completed with supervision assist for safety, and left supine in bed with call bell in reach and all needs met.         Therapy Documentation Precautions:  Precautions Precautions: Fall Restrictions Weight Bearing Restrictions: Yes LLE Weight Bearing: Non weight bearing   Pain: Pain Assessment Pain Score: 4  LLE. See eMAR    Therapy/Group: Individual Therapy  AustLorie Phenix28/2023, 5:48 PM

## 2022-01-18 NOTE — Evaluation (Signed)
Physical Therapy Assessment and Plan  Patient Details  Name: Tammy Good MRN: [id number] Date of Birth: [birth year]  PT Diagnosis: {diagnoses:3041673} Rehab Potential: Excellent ELOS: 9-12 day   Today's Date: 01/18/2022 PT Individual Time: 1020-1140 PT Individual Time Calculation (min): 80 min    Hospital Problem: Principal Problem:   Unilateral complete BKA, left, initial encounter [facility])   Past Medical History:  Past Medical History:  Diagnosis Date   CAD S/P percutaneous coronary angioplasty 08/2013   100% mRCA - PCI Integrity Resolute DES 3.0 mm x 38 mm - 3.35 mm; PTCA of RPA V 2.0 mm x 15 mm   CHF (congestive heart failure) ([place])    Cholesteatoma    right   Diabetes mellitus type 2 in obese (HCC)    On insulin and Invokana   History of osteomyelitis L 5th Toe all 05/2019   s/p Partial Ray Amputation with partial closure; 6 wks Abx & LSFA Atherectomy/DEB PTA with Stent for focal dissection.   Hyperlipidemia with target LDL less than 70    Hypothyroidism (acquired)    Mild essential hypertension    Obesity (BMI 30-39.9) 11/17/2013   PAD (peripheral artery disease) ([place]) 05/26/2019   05/26/19: Abd AoGram- BLE runoff -> L SFA orbital atherectomy - PTA w/ DEB & Stent 6 x 40 Luttonix (for focal dissection) - patent Pop A with 3 V runoff. LEA Dopplers 01/03/2020: RABI (prev) 0.68 (0.53)/ RTBI (prev) 0.58 (0.33); LABI (prev) 0.80 (0.64), LTBI (prev) 0.64 (0.51); R mSFA ~50-74%, L mSFA 50-74%. Patent Prox SFA stent < 49% stenosis   Panhypopituitarism (HCC)    ST elevation myocardial infarction (STEMI) of inferior wall, subsequent episode of care ([place]) 08/2013   80% branch of D1, 40% mid AV groove circumflex, 100% RCA with subacute thrombus -- thrombus extending into RPA V with 100% occlusion after initial angioplasty of mid RCA ;; Post MI ECHO 6/9/'15: EF 50-55%, mild LVH with moderate HK of inferior wall, Gr1 DD, mild LA dilation; mildly reduced RV function   Past Surgical  History:  Past Surgical History:  Procedure Laterality Date   ABDOMINAL AORTOGRAM W/LOWER EXTREMITY N/A 05/26/2019   Procedure: ABDOMINAL AORTOGRAM W/LOWER EXTREMITY;  Surgeon: Marty Heck, MD;  Location: [place] CV LAB;  Service: Cardiovascular;  Laterality: N/A;   ABDOMINAL AORTOGRAM W/LOWER EXTREMITY N/A 11/15/2020   Procedure: ABDOMINAL AORTOGRAM W/LOWER EXTREMITY;  Surgeon: Marty Heck, MD;  Location: [place] CV LAB;  Service: Cardiovascular;  Laterality: N/A;   ABDOMINAL AORTOGRAM W/LOWER EXTREMITY N/A 04/18/2021   Procedure: ABDOMINAL AORTOGRAM W/LOWER EXTREMITY;  Surgeon: Marty Heck, MD;  Location: [place] CV LAB;  Service: Cardiovascular;  Laterality: N/A;   ABDOMINAL AORTOGRAM W/LOWER EXTREMITY Left 07/11/2021   Procedure: ABDOMINAL AORTOGRAM W/LOWER EXTREMITY;  Surgeon: Marty Heck, MD;  Location: [place] CV LAB;  Service: Cardiovascular;  Laterality: Left;   AMPUTATION Left 05/25/2019   Procedure: AMPUTATION RAY 5th;  Surgeon: Trula Slade, DPM;  Location: [place];  Service: Podiatry;  Laterality: Left;   AMPUTATION Left 01/14/2022   Procedure: LEFT BELOW THE KNEE AMPUTATION;  Surgeon: Erle Crocker, MD;  Location: [place];  Service: Orthopedics;  Laterality: Left;  LENGTH OF SURGERY: 90 MINUTES   APPLICATION OF WOUND VAC Left 01/14/2022   Procedure: WOUND VAC PLACEMENT;  Surgeon: Erle Crocker, MD;  Location: [place];  Service: Orthopedics;  Laterality: Left;   BONE BIOPSY Left 04/24/2021   Procedure: BONE BIOPSY;  Surgeon: Landis Martins, DPM;  Location: [place];  Service: Podiatry;  Laterality: Left;  left ankle/fibula   BONE BIOPSY Left 07/15/2021   Procedure: LEFT FOOT BONE BIOPSY;  Surgeon: Trula Slade, DPM;  Location: [place];  Service: Podiatry;  Laterality: Left;   Cardiac Event Monitor  July-August 2015   Sinus rhythm with PVCs   CHOLECYSTECTOMY     COLONOSCOPY N/A 08/31/2013   Procedure:  COLONOSCOPY;  Surgeon: Juanita Craver, MD;  Location: [facility] ENDOSCOPY;  Service: Endoscopy;  Laterality: N/A;   CORONARY STENT INTERVENTION N/A 11/19/2020   Procedure: CORONARY STENT INTERVENTION;  Surgeon: Nelva Bush, MD;  Location: [place] CV LAB;  Service: Cardiovascular;  Laterality: N/A;   ESOPHAGOGASTRODUODENOSCOPY N/A 09/01/2013   Procedure: ESOPHAGOGASTRODUODENOSCOPY (EGD);  Surgeon: Beryle Beams, MD;  Location: [facility] ENDOSCOPY;  Service: Endoscopy;  Laterality: N/A;  bedside   ESOPHAGOGASTRODUODENOSCOPY (EGD) WITH PROPOFOL N/A 05/26/2021   Procedure: ESOPHAGOGASTRODUODENOSCOPY (EGD) WITH PROPOFOL;  Surgeon: Carol Ada, MD;  Location: [place];  Service: Gastroenterology;  Laterality: N/A;   EYE SURGERY Bilateral    bilateral cataracts   INCISION AND DRAINAGE Left 04/24/2021   Procedure: INCISION AND DRAINAGE;  Surgeon: Landis Martins, DPM;  Location: [place];  Service: Podiatry;  Laterality: Left;   IRRIGATION AND DEBRIDEMENT FOOT Left 07/15/2021   Procedure: IRRIGATION AND DEBRIDEMENT FOOT;  Surgeon: Trula Slade, DPM;  Location: [place];  Service: Podiatry;  Laterality: Left;   LEFT HEART CATH AND CORONARY ANGIOGRAPHY N/A 11/19/2020   Procedure: LEFT HEART CATH AND CORONARY ANGIOGRAPHY;  Surgeon: Nelva Bush, MD;  Location: [place] CV LAB;  Service: Cardiovascular;  Laterality: N/A;   LEFT HEART CATHETERIZATION WITH CORONARY ANGIOGRAM N/A 08/30/2013   Procedure: LEFT HEART CATHETERIZATION WITH CORONARY ANGIOGRAM;  Surgeon: Leonie Man, MD;  Location: [facility] CATH LAB: 100% mRCA (thrombus - extends to RPAV), 80% D1, 40% AVG Cx.   PERCUTANEOUS CORONARY STENT INTERVENTION (PCI-S)  08/30/2013   Procedure: PERCUTANEOUS CORONARY STENT INTERVENTION (PCI-S);  Surgeon: Leonie Man, MD;  Location: [facility] CATH LAB;  Integrity Resolute DES 2.0 mm x 38 mm -- 3.35 mm.; PTCA of proximal RPA V. - 3.0 mm x 15 mm balloon   PERIPHERAL VASCULAR ATHERECTOMY  05/26/2019   Procedure:  PERIPHERAL VASCULAR ATHERECTOMY;  Surgeon: Marty Heck, MD;  Location: [place] CV LAB;  Service: Cardiovascular;;  Left SFA   PERIPHERAL VASCULAR BALLOON ANGIOPLASTY Left 07/11/2021   Procedure: PERIPHERAL VASCULAR BALLOON ANGIOPLASTY;  Surgeon: Marty Heck, MD;  Location: [place] CV LAB;  Service: Cardiovascular;  Laterality: Left;  PT TRUNK / AT   PERIPHERAL VASCULAR INTERVENTION  05/26/2019   Procedure: PERIPHERAL VASCULAR INTERVENTION;  Surgeon: Marty Heck, MD;  Location: [place] CV LAB;  Service: Cardiovascular;;  Left SFA   PERIPHERAL VASCULAR INTERVENTION Right 11/15/2020   Procedure: PERIPHERAL VASCULAR INTERVENTION;  Surgeon: Marty Heck, MD;  Location: [place] CV LAB;  Service: Cardiovascular;  Laterality: Right;  Superficial Femoral Artery   PERIPHERAL VASCULAR INTERVENTION Left 07/11/2021   Procedure: PERIPHERAL VASCULAR INTERVENTION;  Surgeon: Marty Heck, MD;  Location: [place] CV LAB;  Service: Cardiovascular;  Laterality: Left;  SFA   PITUITARY SURGERY     TEE WITHOUT CARDIOVERSION N/A 04/26/2021   Procedure: TRANSESOPHAGEAL ECHOCARDIOGRAM (TEE);  Surgeon: Buford Dresser, MD;  Location: [facility] ENDOSCOPY;  Service: Cardiovascular;  Laterality: N/A;   TRANSTHORACIC ECHOCARDIOGRAM  08/30/2013   mild LVH. EF 50-55%. Moderate HK of the entire inferior myocardium. GR 1 DD. Mild LA dilation.  Mildly reduced RV function   TYMPANOMASTOIDECTOMY Right 12/28/2017   Procedure: RIGHT TYMPANOMASTOIDECTOMY;  Surgeon: Leta Baptist, MD;  Location: [place];  Service: ENT;  Laterality: Right;    Assessment & Plan Clinical Impression: Patient is a 71 y.o. year old female with recent admission to the hospital on *** with ***.  Patient transferred to CIR on 01/17/2022 .   Patient currently requires {CVE:9381017} with mobility secondary to {impairments:3041632}.  Prior to hospitalization, patient was {PZW:2585277} with  mobility and lived with Alone in a House home.  Home access is 3-4 at the front, 4 through the garage (typical entrance)Stairs to enter.  Patient will benefit from skilled PT intervention to {benefits:22816} for planned discharge {planned discharge:3041670}.  Anticipate patient will {follow OE:4235361} at discharge.  PT - End of Session Activity Tolerance: Tolerates 10 - 20 min activity with multiple rests Endurance Deficit: Yes PT Assessment Rehab Potential (ACUTE/IP ONLY): Excellent PT Barriers to Discharge: [place] home environment;Decreased caregiver support;Home environment access/layout;Wound Care;Weight;Weight bearing restrictions PT Patient demonstrates impairments in the following area(s): Balance;Edema;Endurance;Pain;Sensory;Skin Integrity PT Transfers Functional Problem(s): Bed Mobility;Bed to Chair;Car;Furniture;Floor PT Locomotion Functional Problem(s): Ambulation;Wheelchair Mobility;Stairs PT Plan PT Intensity: Minimum of 1-2 x/day ,45 to 90 minutes PT Frequency: 5 out of 7 days PT Duration Estimated Length of Stay: 9-12 day PT Treatment/Interventions: Ambulation/gait training;Balance/vestibular training;Community reintegration;Discharge planning;Disease management/prevention;DME/adaptive equipment instruction;Pain management;Functional mobility training;Neuromuscular re-education;Patient/family education;Psychosocial support;Skin care/wound management;Splinting/orthotics;Stair training;Therapeutic Activities;Therapeutic Exercise;UE/LE Strength taining/ROM;UE/LE Coordination activities;Wheelchair propulsion/positioning PT Transfers Anticipated Outcome(s): Supervision assist PT Locomotion Anticipated Outcome(s): Mod I Wheelchair level.  CGA ambulatory for short distances with RW PT Recommendation Recommendations for Other Services: Therapeutic Recreation consult Therapeutic Recreation Interventions: Stress management;Outing/community reintergration;Kitchen group Follow Up  Recommendations: Home health PT Patient destination: Home Equipment Recommended: Rolling walker with 5" wheels;Wheelchair (measurements);Wheelchair cushion (measurements)   PT Evaluation Precautions/Restrictions Precautions Precautions: Fall Restrictions Weight Bearing Restrictions: Yes LLE Weight Bearing: Non weight bearing General   Vital Signs Pain Pain Assessment Pain Scale: 0-10 Pain Score: 4  Pain Type: Surgical pain Pain Location: Leg Pain Orientation: Left Pain Descriptors / Indicators: Spasm;Sharp Pain Onset: On-going Patients Stated Pain Goal: 0 Pain Intervention(s): Medication (See eMAR) Pain Interference Pain Interference Pain Effect on Sleep: 2. Occasionally Pain Interference with Therapy Activities: 2. Occasionally Pain Interference with Day-to-Day Activities: 2. Occasionally Home Living/Prior Functioning Home Living Available Help at Discharge: Family (granddaughter, Delight Hoh, moving in . alot of family to asisst) Type of Home: House Home Access: Stairs to enter [employer] of Steps: 3-4 at the front, 4 through the garage (typical entrance) Entrance Stairs-Rails: Left (Grab bar on the right side) Home Layout: One level Bathroom Shower/Tub: Multimedia programmer: Standard Bathroom Accessibility: No (Has a walk in shower) Additional Comments: has BSC, shower seat with back, RW, and SPC  Lives With: Alone Prior Function Level of Independence: Independent with basic ADLs;Independent with homemaking with ambulation;Requires assistive device for independence Driving: Yes Vision/Perception  Vision - History Ability to See in Adequate Light: 0 Adequate Perception Perception: Within Functional Limits Praxis Praxis: Intact  Cognition Overall Cognitive Status: Within Functional Limits for tasks assessed Arousal/Alertness: Awake/alert Memory: Appears intact Awareness: Appears intact Problem Solving: Appears intact Safety/Judgment:  Appears intact Sensation Sensation Light Touch: Appears Intact Proprioception: Appears Intact Coordination Gross Motor Movements are Fluid and Coordinated: Yes Fine Motor Movements are Fluid and Coordinated: Yes Finger Nose Finger Test: [facility] Motor  Motor Motor: Within Functional Limits   Trunk/Postural Assessment  Cervical Assessment Cervical Assessment: Within Functional Limits Thoracic Assessment  Thoracic Assessment: Within Functional Limits Lumbar Assessment Lumbar Assessment: Within Functional Limits Postural Control Postural Control: Within Functional Limits  Balance Balance Balance Assessed: Yes Dynamic Sitting Balance Dynamic Sitting - Balance Support: During functional activity Dynamic Sitting - Level of Assistance: 5: Stand by assistance Static Standing Balance Static Standing - Balance Support: During functional activity Static Standing - Level of Assistance: 4: Min assist Dynamic Standing Balance Dynamic Standing - Balance Support: During functional activity Dynamic Standing - Level of Assistance: 3: Mod assist;4: Min assist Extremity Assessment      RLE Assessment RLE Assessment: Within Functional Limits General Strength Comments: At least 4+/5 for knee flexion & extension. 4+/5 in all directions for hip LLE Assessment LLE Assessment: Exceptions to [facility] General Strength Comments: At least 3/5 knee flexion & extension. 4+/5 for hip in all directions  Care Tool Care Tool Bed Mobility Roll left and right activity   Roll left and right assist level: Independent    Sit to lying activity   Sit to lying assist level: Supervision/Verbal cueing    Lying to sitting on side of bed activity   Lying to sitting on side of bed assist level: the ability to move from lying on the back to sitting on the side of the bed with no back support.: Supervision/Verbal cueing     Care Tool Transfers Sit to stand transfer   Sit to stand assist level: Minimal Assistance -  Patient > 75%    Chair/bed transfer   Chair/bed transfer assist level: Minimal Assistance - Patient > 75%     Physiological scientist transfer assist level: Moderate Assistance - Patient 50 - 74%      Care Tool Locomotion Ambulation   Assist level: Moderate Assistance - Patient 50 - 74% Assistive device: Walker-rolling Max distance: 77f  Walk 10 feet activity   Assist level: Moderate Assistance - Patient - 50 - 74% Assistive device: Walker-rolling   Walk 50 feet with 2 turns activity Walk 50 feet with 2 turns activity did not occur: Safety/medical concerns      Walk 150 feet activity Walk 150 feet activity did not occur: Safety/medical concerns      Walk 10 feet on uneven surfaces activity Walk 10 feet on uneven surfaces activity did not occur: Safety/medical concerns      Stairs Stair activity did not occur: Safety/medical concerns        Walk up/down 1 step activity Walk up/down 1 step or curb (drop down) activity did not occur: Safety/medical concerns      Walk up/down 4 steps activity Walk up/down 4 steps activity did not occur: Safety/medical concerns      Walk up/down 12 steps activity Walk up/down 12 steps activity did not occur: Safety/medical concerns      Pick up small objects from floor Pick up small object from the floor (from standing position) activity did not occur: Safety/medical concerns      Wheelchair Is the patient using a wheelchair?: Yes Type of Wheelchair: Manual   Wheelchair assist level: Minimal Assistance - Patient > 75% Max wheelchair distance: 150  Wheel 50 feet with 2 turns activity   Assist Level: Minimal Assistance - Patient > 75%  Wheel 150 feet activity   Assist Level: Minimal Assistance - Patient > 75%    Refer to Care Plan for Long Term Goals  SHORT TERM GOAL WEEK 1 PT Short Term Goal 1 (Week 1): Pt will  transfer to and from [facility] with CGA PT Short Term Goal 2 (Week 1): Pt will ambulate 79f with min  assist and LRAD PT Short Term Goal 3 (Week 1): pt will perform stair management with use of shower chair  Recommendations for other services: {RECOMMENDATIONS FOR OTHER SERVICES:3049016}  Skilled Therapeutic Intervention Mobility Bed Mobility Bed Mobility: Rolling Right;Rolling Left;Supine to Sit;Sit to Sidelying Left;Sit to Supine Rolling Right: Independent Rolling Left: Independent Supine to Sit: Supervision/Verbal cueing Sit to Supine: Supervision/Verbal cueing Transfers Transfers: Sit to Stand;Stand to Sit Sit to Stand: Supervision/Verbal cueing;Minimal Assistance - Patient > 75% Stand to Sit: Minimal Assistance - Patient > 75% Stand Pivot Transfers: Minimal Assistance - Patient > 75% Stand Pivot Transfer Details: Verbal cues for precautions/safety;Verbal cues for safe use of DME/AE Transfer (Assistive device): Rolling walker Locomotion  Gait Ambulation: Yes Gait Assistance: Minimal Assistance - Patient > 75%;Moderate Assistance - Patient 50-74% Gait Distance (Feet): 10 Feet Assistive device: Rolling walker Gait Gait: Yes Gait Pattern: Impaired Gait Pattern: Poor foot clearance - right Gait velocity: decreased Stairs / Additional Locomotion Stairs: No Wheelchair Mobility Wheelchair Mobility: Yes Wheelchair Assistance: Minimal assistance - Patient >75% Wheelchair Propulsion: Both upper extremities Wheelchair Parts Management: Needs assistance Distance: 150   Discharge Criteria: Patient will be discharged from PT if patient refuses treatment 3 consecutive times without medical reason, if treatment goals not met, if there is a change in medical status, if patient makes no progress towards goals or if patient is discharged from hospital.  The above assessment, treatment plan, treatment alternatives and goals were discussed and mutually agreed upon: {Assessment/Treatment Plan Discussed/Agreed:3049017}  ALorie Phenix10/28/2023, 6:31 PM

## 2022-01-19 DIAGNOSIS — S88112A Complete traumatic amputation at level between knee and ankle, left lower leg, initial encounter: Secondary | ICD-10-CM | POA: Diagnosis not present

## 2022-01-19 LAB — GLUCOSE, CAPILLARY
Glucose-Capillary: 84 mg/dL (ref 70–99)
Glucose-Capillary: 126 mg/dL — ABNORMAL HIGH (ref 70–99)
Glucose-Capillary: 136 mg/dL — ABNORMAL HIGH (ref 70–99)
Glucose-Capillary: 154 mg/dL — ABNORMAL HIGH (ref 70–99)

## 2022-01-19 NOTE — Progress Notes (Signed)
PROGRESS NOTE   Subjective/Complaints:  Dressing changed , some oozing along suture line, pt on ELiquis and plavix ROS- neg CP, SOB, N/V/D Objective:   No results found. Recent Labs    01/17/22 0735  WBC 7.6  HGB 8.0*  HCT 26.6*  PLT 465*   Recent Labs    01/17/22 0735  NA 140  K 3.9  CL 107  CO2 27  GLUCOSE 102*  BUN 13  CREATININE 0.70  CALCIUM 8.4*    Intake/Output Summary (Last 24 hours) at 01/19/2022 1029 Last data filed at 01/18/2022 1815 Gross per 24 hour  Intake 476 ml  Output --  Net 476 ml        Physical Exam: Vital Signs Blood pressure (!) 142/55, pulse 64, temperature 98.3 F (36.8 C), temperature source Oral, resp. rate 18, height '5\' 7"'$  (1.702 m), weight 85.4 kg, SpO2 94 %.   General: No acute distress Mood and affect are appropriate Heart: IRRR, no rubs murmurs or extra sounds Lungs: Clear to auscultation, breathing unlabored, no rales or wheezes Abdomen: Positive bowel sounds, soft nontender to palpation, nondistended Extremities: No clubbing, cyanosis, or edema Skin: No evidence of breakdown, no evidence of rash Neurologic: Cranial nerves II through XII intact, motor strength is 5/5 in bilateral deltoid, bicep, tricep, grip, right hip flexor, knee extensors, ankle dorsiflexor and plantar flexor, 4/5 left HF  Musculoskeletal: left BKA mild distal edema with oozing at several sites along the joint line    Assessment/Plan: 1. Functional deficits which require 3+ hours per day of interdisciplinary therapy in a comprehensive inpatient rehab setting. Physiatrist is providing close team supervision and 24 hour management of active medical problems listed below. Physiatrist and rehab team continue to assess barriers to discharge/monitor patient progress toward functional and medical goals  Care Tool:  Bathing    Body parts bathed by patient: Left arm, Right arm, Chest, Abdomen, Front  perineal area, Left upper leg, Right upper leg, Face   Body parts bathed by helper: Right lower leg, Buttocks Body parts n/a: Left lower leg   Bathing assist Assist Level: Minimal Assistance - Patient > 75%     Upper Body Dressing/Undressing Upper body dressing   What is the patient wearing?: Pull over shirt    Upper body assist Assist Level: Contact Guard/Touching assist    Lower Body Dressing/Undressing Lower body dressing      What is the patient wearing?: Pants, Incontinence brief     Lower body assist Assist for lower body dressing: Moderate Assistance - Patient 50 - 74%     Toileting Toileting    Toileting assist Assist for toileting: Maximal Assistance - Patient 25 - 49%     Transfers Chair/bed transfer  Transfers assist     Chair/bed transfer assist level: Minimal Assistance - Patient > 75%     Locomotion Ambulation   Ambulation assist      Assist level: Moderate Assistance - Patient 50 - 74% Assistive device: Walker-rolling Max distance: 15f   Walk 10 feet activity   Assist     Assist level: Moderate Assistance - Patient - 50 - 74% Assistive device: Walker-rolling   Walk 50 feet activity  Assist Walk 50 feet with 2 turns activity did not occur: Safety/medical concerns         Walk 150 feet activity   Assist Walk 150 feet activity did not occur: Safety/medical concerns         Walk 10 feet on uneven surface  activity   Assist Walk 10 feet on uneven surfaces activity did not occur: Safety/medical concerns         Wheelchair     Assist Is the patient using a wheelchair?: Yes Type of Wheelchair: Manual    Wheelchair assist level: Minimal Assistance - Patient > 75% Max wheelchair distance: 150    Wheelchair 50 feet with 2 turns activity    Assist        Assist Level: Minimal Assistance - Patient > 75%   Wheelchair 150 feet activity     Assist      Assist Level: Minimal Assistance - Patient >  75%   Blood pressure (!) 142/55, pulse 64, temperature 98.3 F (36.8 C), temperature source Oral, resp. rate 18, height '5\' 7"'$  (1.702 m), weight 85.4 kg, SpO2 94 %.    Medical Problem List and Plan: 1. 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  Moderate to intermittent supervision with PT and OT             -Cont CIR PT, OT             -Wound vac was removed, surgery considering leaving sutures in place 4 weeks postop depending on wound healing             -NWB LLE 2.  Antithrombotics: -DVT/anticoagulation:  Pharmaceutical: Eliquis resumed on 10/27             -antiplatelet therapy: Plavix 3. Pain Management:  hydrocodone prn.  4. Mood/Behavior/Sleep:  LCSW to follow for evaluation and support.              -antipsychotic agents:  N?A 5. Neuropsych/cognition: This patient is capable of making decisions on her own behalf. 6. Skin/Wound Care: Monitor wound for healing.   --Protein supplements and vitamins added to promote healing.  7. Fluids/Electrolytes/Nutrition: Monitor I/O. Check CMET on 10/30 8.  CAD: Monitor for symptoms with increase in activity. Continue ASA, Plavix, Lipitor. Nitroglycerin PRN. Denies chest pain 9. Hypotension: Monitor BP TID.  Has been stable with midodrine BID.  10 T2DM: Hgb A1C- 5.4 and well controlled.              --Continue Insulin Glargine will adjust to 22 units from 24 units with SSI for elevated BS.              --    Latest Ref Rng & Units 01/17/2022    7:35 AM 01/16/2022    5:30 AM 01/15/2022    7:09 AM  CBC  WBC 4.0 - 10.5 K/uL 7.6  8.5  9.2   Hemoglobin 12.0 - 15.0 g/dL 8.0  7.2  7.4   Hematocrit 36.0 - 46.0 % 26.6  24.7  24.8   Platelets 150 - 400 K/uL 465  434  391    .  11. ABLA on anemia of chronic disease: Stable stable in 7.2->8.0 today.  --Monitor for signs of bleeding as Eliquis and Plavix resumed 10/27. If Hgb drops may need  to hold  plavix --h/o [facility] (Dr. Collene Mares) -Continue ferrous sulfate 12. PAD: Continue Plavix and lipitor 13. Gastroparesis?: Was on reglan PRN PTA.  14. Constipation             -Sorbitol ordered 15. Hypothyroidism             -Continue synthroid  16. Afib             -Continue Eliquis     LOS: 2 days A FACE TO FACE EVALUATION WAS PERFORMED  Charlett Blake 01/19/2022, 10:29 AM

## 2022-01-19 NOTE — Plan of Care (Signed)
  Problem: RH Balance Goal: LTG Patient will maintain dynamic sitting balance (PT) Description: LTG:  Patient will maintain dynamic sitting balance with assistance during mobility activities (PT) Flowsheets (Taken 01/19/2022 0340) LTG: Pt will maintain dynamic sitting balance during mobility activities with:: Independent Goal: LTG Patient will maintain dynamic standing balance (PT) Description: LTG:  Patient will maintain dynamic standing balance with assistance during mobility activities (PT) Flowsheets (Taken 01/19/2022 0340) LTG: Pt will maintain dynamic standing balance during mobility activities with:: Supervision/Verbal cueing   Problem: Sit to Stand Goal: LTG:  Patient will perform sit to stand with assistance level (PT) Description: LTG:  Patient will perform sit to stand with assistance level (PT) Flowsheets (Taken 01/19/2022 0340) LTG: PT will perform sit to stand in preparation for functional mobility with assistance level: Supervision/Verbal cueing   Problem: RH Bed Mobility Goal: LTG Patient will perform bed mobility with assist (PT) Description: LTG: Patient will perform bed mobility with assistance, with/without cues (PT). Flowsheets (Taken 01/19/2022 0340) LTG: Pt will perform bed mobility with assistance level of: Independent with assistive device    Problem: RH Bed to Chair Transfers Goal: LTG Patient will perform bed/chair transfers w/assist (PT) Description: LTG: Patient will perform bed to chair transfers with assistance (PT). Flowsheets (Taken 01/19/2022 0340) LTG: Pt will perform Bed to Chair Transfers with assistance level: Supervision/Verbal cueing   Problem: RH Car Transfers Goal: LTG Patient will perform car transfers with assist (PT) Description: LTG: Patient will perform car transfers with assistance (PT). Flowsheets (Taken 01/19/2022 0340) LTG: Pt will perform car transfers with assist:: Contact Guard/Touching assist   Problem: RH Ambulation Goal: LTG  Patient will ambulate in controlled environment (PT) Description: LTG: Patient will ambulate in a controlled environment, # of feet with assistance (PT). Flowsheets (Taken 01/19/2022 0340) LTG: Pt will ambulate in controlled environ  assist needed:: Supervision/Verbal cueing LTG: Ambulation distance in controlled environment: 24f with LRAD Goal: LTG Patient will ambulate in home environment (PT) Description: LTG: Patient will ambulate in home environment, # of feet with assistance (PT). Flowsheets (Taken 01/19/2022 0340) LTG: Pt will ambulate in home environ  assist needed:: Supervision/Verbal cueing LTG: Ambulation distance in home environment: supervision assist with LRAD   Problem: RH Wheelchair Mobility Goal: LTG Patient will propel w/c in controlled environment (PT) Description: LTG: Patient will propel wheelchair in controlled environment, # of feet with assist (PT) Flowsheets (Taken 01/19/2022 0340) LTG: Pt will propel w/c in controlled environ  assist needed:: Independent with assistive device LTG: Propel w/c distance in controlled environment: 1515fGoal: LTG Patient will propel w/c in home environment (PT) Description: LTG: Patient will propel wheelchair in home environment, # of feet with assistance (PT). Flowsheets (Taken 01/19/2022 0340) LTG: Propel w/c distance in home environment: 5044fith LRAD   Problem: RH Stairs Goal: LTG Patient will ambulate up and down stairs w/assist (PT) Description: LTG: Patient will ambulate up and down # of stairs with assistance (PT) Flowsheets (Taken 01/19/2022 0340) LTG: Pt will ambulate up/down stairs assist needed:: Minimal Assistance - Patient > 75% LTG: Pt will  ambulate up and down number of stairs: 4 steps with use of shower chair to access home

## 2022-01-19 NOTE — Progress Notes (Signed)
Physical Therapy Session Note  Patient Details  Name: Tammy Good MRN: [id number] Date of Birth: April 23, 1950  Today's Date: 01/19/2022 PT Individual Time: 1107-1205 PT Individual Time Calculation (min): 58 min   Short Term Goals: Week 1:  PT Short Term Goal 1 (Week 1): Pt will transfer [facility] with CGA PT Short Term Goal 2 (Week 1): Pt will ambulate 25f with min assist and LRAD PT Short Term Goal 3 (Week 1): pt will perform stair management with use of shower chair  Skilled Therapeutic Interventions/Progress Updates:    Chart reviewed and pt agreeable to therapy. Pt received semi-reclined in bed with 2/10 c/o throbbing pain at surgical site. Session focused on techniques for functional transfers to promote knowledge and skill for safe home access and LE strengthening to maintain and improve mobility. Pt initiated session with ModI transfer to EOB. Pt then discussed strategy for bed>chair transfer with PT and was able to verbalize safe set up and protection of surgical site. Pt then transferred bed>chair with SSTT using MinA + RW. Pt then practiced sit to stand technique [facility] best technique for pt activity level and ability. Pt prefers two-hand push [facility] and is able to completed with CGA/MinA depending on energy level. Pt then practiced 4 x sit to stand each with CGA/MinA depending on energy. Pt then [place] and of note pt was able to verbalize steps for safe transfer. In bed, pt completed 2x 10 L hip abd and ext in sidelying. At end of session, pt was left semi-reclined in bed with alarm engaged, nurse call bell and all needs in reach.     Therapy Documentation Precautions:  Precautions Precautions: Fall Restrictions Weight Bearing Restrictions: Yes LLE Weight Bearing: Non weight bearing    Therapy/Group: Individual Therapy  KMarquette Old PT, DPT 01/19/2022, 3:37 PM

## 2022-01-20 DIAGNOSIS — E11621 Type 2 diabetes mellitus with foot ulcer: Secondary | ICD-10-CM | POA: Diagnosis not present

## 2022-01-20 DIAGNOSIS — L97509 Non-pressure chronic ulcer of other part of unspecified foot with unspecified severity: Secondary | ICD-10-CM

## 2022-01-20 DIAGNOSIS — Z794 Long term (current) use of insulin: Secondary | ICD-10-CM

## 2022-01-20 DIAGNOSIS — S88112A Complete traumatic amputation at level between knee and ankle, left lower leg, initial encounter: Secondary | ICD-10-CM | POA: Diagnosis not present

## 2022-01-20 DIAGNOSIS — D62 Acute posthemorrhagic anemia: Secondary | ICD-10-CM | POA: Diagnosis not present

## 2022-01-20 DIAGNOSIS — D75839 Thrombocytosis, unspecified: Secondary | ICD-10-CM

## 2022-01-20 LAB — GLUCOSE, CAPILLARY
Glucose-Capillary: 100 mg/dL — ABNORMAL HIGH (ref 70–99)
Glucose-Capillary: 134 mg/dL — ABNORMAL HIGH (ref 70–99)
Glucose-Capillary: 133 mg/dL — ABNORMAL HIGH (ref 70–99)
Glucose-Capillary: 149 mg/dL — ABNORMAL HIGH (ref 70–99)

## 2022-01-20 LAB — CBC WITH DIFFERENTIAL/PLATELET
Abs Immature Granulocytes: 0.02 10*3/uL (ref 0.00–0.07)
Basophils Absolute: 0.1 10*3/uL (ref 0.0–0.1)
Basophils Relative: 1 %
Eosinophils Absolute: 0.2 10*3/uL (ref 0.0–0.5)
Eosinophils Relative: 3 %
HCT: 28.5 % — ABNORMAL LOW (ref 36.0–46.0)
Hemoglobin: 8.3 g/dL — ABNORMAL LOW (ref 12.0–15.0)
Immature Granulocytes: 0 %
Lymphocytes Relative: 15 %
Lymphs Abs: 1.2 10*3/uL (ref 0.7–4.0)
MCH: 26.2 pg (ref 26.0–34.0)
MCHC: 29.1 g/dL — ABNORMAL LOW (ref 30.0–36.0)
MCV: 89.9 fL (ref 80.0–100.0)
Monocytes Absolute: 0.9 10*3/uL (ref 0.1–1.0)
Monocytes Relative: 12 %
Neutro Abs: 5.6 10*3/uL (ref 1.7–7.7)
Neutrophils Relative %: 69 %
Platelets: 671 10*3/uL — ABNORMAL HIGH (ref 150–400)
RBC: 3.17 MIL/uL — ABNORMAL LOW (ref 3.87–5.11)
RDW: 20.2 % — ABNORMAL HIGH (ref 11.5–15.5)
WBC: 8.1 10*3/uL (ref 4.0–10.5)
nRBC: 0 % (ref 0.0–0.2)

## 2022-01-20 LAB — COMPREHENSIVE METABOLIC PANEL
ALT: 14 U/L (ref 0–44)
AST: 13 U/L — ABNORMAL LOW (ref 15–41)
Albumin: 2.5 g/dL — ABNORMAL LOW (ref 3.5–5.0)
Alkaline Phosphatase: 57 U/L (ref 38–126)
Anion gap: 8 (ref 5–15)
BUN: 21 mg/dL (ref 8–23)
CO2: 26 mmol/L (ref 22–32)
Calcium: 8.6 mg/dL — ABNORMAL LOW (ref 8.9–10.3)
Chloride: 107 mmol/L (ref 98–111)
Creatinine, Ser: 0.59 mg/dL (ref 0.44–1.00)
GFR, Estimated: >60 mL/min (ref >60)
Glucose, Bld: 96 mg/dL (ref 70–99)
Potassium: 3.5 mmol/L (ref 3.5–5.1)
Sodium: 141 mmol/L (ref 135–145)
Total Bilirubin: 0.7 mg/dL (ref 0.3–1.2)
Total Protein: 5.9 g/dL — ABNORMAL LOW (ref 6.5–8.1)

## 2022-01-20 LAB — RETICULOCYTES
Immature Retic Fract: 38.2 % — ABNORMAL HIGH (ref 2.3–15.9)
RBC.: 3.12 MIL/uL — ABNORMAL LOW (ref 3.87–5.11)
Retic Count, Absolute: 73.3 10*3/uL (ref 19.0–186.0)
Retic Ct Pct: 2.4 % (ref 0.4–3.1)

## 2022-01-20 LAB — FOLATE: Folate: 10.7 ng/mL (ref >5.9)

## 2022-01-20 LAB — IRON AND TIBC
Iron: 37 ug/dL (ref 28–170)
Saturation Ratios: 13 % (ref 10.4–31.8)
TIBC: 281 ug/dL (ref 250–450)
UIBC: 244 ug/dL

## 2022-01-20 LAB — FERRITIN: Ferritin: 27 ng/mL (ref 11–307)

## 2022-01-20 LAB — VITAMIN B12: Vitamin B-12: 193 pg/mL (ref 180–914)

## 2022-01-20 MED ORDER — POLYETHYLENE GLYCOL 3350 17 G PO PACK
17.0000 g | PACK | Freq: Every day | ORAL | Status: DC
  Administered 2022-01-20 – 2022-01-30 (×11): 17 g via ORAL
  Filled 2022-01-20 (×12): qty 1

## 2022-01-20 MED ORDER — CHLORHEXIDINE GLUCONATE CLOTH 2 % EX PADS
6.0000 | MEDICATED_PAD | Freq: Every day | CUTANEOUS | Status: AC
  Administered 2022-01-21 – 2022-01-25 (×3): 6 via TOPICAL

## 2022-01-20 MED ORDER — MUPIROCIN 2 % EX OINT
1.0000 | TOPICAL_OINTMENT | Freq: Two times a day (BID) | CUTANEOUS | Status: AC
  Administered 2022-01-20 – 2022-01-25 (×10): 1 via NASAL
  Filled 2022-01-20: qty 22

## 2022-01-20 NOTE — Progress Notes (Signed)
Inpatient Rehabilitation  Patient information reviewed and entered into eRehab system by Jackalyn Haith M. Demmi Sindt, M.A., CCC/SLP, PPS Coordinator.  Information including medical coding, functional ability and quality indicators will be reviewed and updated through discharge.    

## 2022-01-20 NOTE — Progress Notes (Signed)
Physical Therapy Session Note  Patient Details  Name: Tammy Good MRN: [id number] Date of Birth: [birth year]  Today's Date: 01/20/2022 PT Individual Time: 1103-1201 PT Individual Time Calculation (min): 58 min   Short Term Goals: Week 1:  PT Short Term Goal 1 (Week 1): Pt will transfer to and from [facility] with CGA PT Short Term Goal 2 (Week 1): Pt will ambulate 9f with min assist and LRAD PT Short Term Goal 3 (Week 1): pt will perform stair management with use of shower chair Week 2:    Week 3:     Skilled Therapeutic Interventions/Progress Updates:     PAIN 4/10, mobility, rewrapping, therex to tolerance.  Pt initially oob in wc Wc propulsion [id number]/supervision, cues In parallel bars: Sit to stand w/min assist, additional time, arduous transition. Stands approx 2 min at a time/repeated 4X total and performs: Hip abd 3x5 Hip extension 3x5  sliding board transfer wc to mat w/instruction and set up assist, cga only. Seated LAQ Press ups w/blocks - pt became nauseated after 5reps, states issues w/constipation which she feels is behind nausea Sit to supine to prone w/set up only  Prone lying x [id number] hip extension 4x5 Prone hamstring curls 4x5  Prone to sit w/supervision sliding board transfer w/set up and cues, supervision.  Pt educated re: pre prosthetic training, basics of prosthetic mechanics/process of fitting.  Wc propulsion x [id number]. Pt left oob in wc w/alarm belt set and needs in reach   Therapy Documentation Precautions:  Precautions Precautions: Fall Restrictions Weight Bearing Restrictions: No LLE Weight Bearing: Non weight bearing    Therapy/Group: Individual Therapy BarbCallie Fielding  [place]30/2023, 12:01 PM

## 2022-01-20 NOTE — Progress Notes (Signed)
Inpatient Rehabilitation Care Coordinator Assessment and Plan Patient Details  Name: Tammy Good MRN: [id number] Date of Birth: April 10, 1950  Today's Date: 01/20/2022  Hospital Problems: Principal Problem:   Unilateral complete BKA, left, initial encounter [facility])  Past Medical History:  Past Medical History:  Diagnosis Date   CAD S/P percutaneous coronary angioplasty 08/2013   100% mRCA - PCI Integrity Resolute DES 3.0 mm x 38 mm - 3.35 mm; PTCA of RPA V 2.0 mm x 15 mm   CHF (congestive heart failure) ([place])    Cholesteatoma    right   Diabetes mellitus type 2 in obese (HCC)    On insulin and Invokana   History of osteomyelitis L 5th Toe all 05/2019   s/p Partial Ray Amputation with partial closure; 6 wks Abx & LSFA Atherectomy/DEB PTA with Stent for focal dissection.   Hyperlipidemia with target LDL less than 70    Hypothyroidism (acquired)    Mild essential hypertension    Obesity (BMI 30-39.9) 11/17/2013   PAD (peripheral artery disease) ([place]) 05/26/2019   05/26/19: Abd AoGram- BLE runoff -> L SFA orbital atherectomy - PTA w/ DEB & Stent 6 x 40 Luttonix (for focal dissection) - patent Pop A with 3 V runoff. LEA Dopplers 01/03/2020: RABI (prev) 0.68 (0.53)/ RTBI (prev) 0.58 (0.33); LABI (prev) 0.80 (0.64), LTBI (prev) 0.64 (0.51); R mSFA ~50-74%, L mSFA 50-74%. Patent Prox SFA stent < 49% stenosis   Panhypopituitarism (HCC)    ST elevation myocardial infarction (STEMI) of inferior wall, subsequent episode of care ([place]) 08/2013   80% branch of D1, 40% mid AV groove circumflex, 100% RCA with subacute thrombus -- thrombus extending into RPA V with 100% occlusion after initial angioplasty of mid RCA ;; Post MI ECHO 6/9/'15: EF 50-55%, mild LVH with moderate HK of inferior wall, Gr1 DD, mild LA dilation; mildly reduced RV function   Past Surgical History:  Past Surgical History:  Procedure Laterality Date   ABDOMINAL AORTOGRAM W/LOWER EXTREMITY N/A 05/26/2019   Procedure: ABDOMINAL  AORTOGRAM W/LOWER EXTREMITY;  Surgeon: Marty Heck, MD;  Location: [place] [facility];  Service: Cardiovascular;  Laterality: N/A;   ABDOMINAL AORTOGRAM W/LOWER EXTREMITY N/A 11/15/2020   Procedure: ABDOMINAL AORTOGRAM W/LOWER EXTREMITY;  Surgeon: Marty Heck, MD;  Location: [place] [facility];  Service: Cardiovascular;  Laterality: N/A;   ABDOMINAL AORTOGRAM W/LOWER EXTREMITY N/A 04/18/2021   Procedure: ABDOMINAL AORTOGRAM W/LOWER EXTREMITY;  Surgeon: Marty Heck, MD;  Location: [place] [facility];  Service: Cardiovascular;  Laterality: N/A;   ABDOMINAL AORTOGRAM W/LOWER EXTREMITY Left 07/11/2021   Procedure: ABDOMINAL AORTOGRAM W/LOWER EXTREMITY;  Surgeon: Marty Heck, MD;  Location: [place] [facility];  Service: Cardiovascular;  Laterality: Left;   AMPUTATION Left 05/25/2019   Procedure: AMPUTATION RAY 5th;  Surgeon: Trula Slade, DPM;  Location: [place];  Service: Podiatry;  Laterality: Left;   AMPUTATION Left 01/14/2022   Procedure: LEFT BELOW THE KNEE AMPUTATION;  Surgeon: Erle Crocker, MD;  Location: [place];  Service: Orthopedics;  Laterality: Left;  LENGTH OF SURGERY: 90 MINUTES   APPLICATION OF WOUND VAC Left 01/14/2022   Procedure: WOUND VAC PLACEMENT;  Surgeon: Erle Crocker, MD;  Location: [place];  Service: Orthopedics;  Laterality: Left;   BONE BIOPSY Left 04/24/2021   Procedure: BONE BIOPSY;  Surgeon: Landis Martins, DPM;  Location: [place];  Service: Podiatry;  Laterality: Left;  left ankle/fibula   BONE BIOPSY Left 07/15/2021   Procedure: LEFT FOOT BONE  BIOPSY;  Surgeon: Trula Slade, DPM;  Location: [place];  Service: Podiatry;  Laterality: Left;   Cardiac Event Monitor  July-August 2015   Sinus rhythm with PVCs   CHOLECYSTECTOMY     COLONOSCOPY N/A 08/31/2013   Procedure: COLONOSCOPY;  Surgeon: Juanita Craver, MD;  Location: [facility] ENDOSCOPY;  Service: Endoscopy;  Laterality: N/A;   CORONARY STENT INTERVENTION N/A 11/19/2020    Procedure: CORONARY STENT INTERVENTION;  Surgeon: Nelva Bush, MD;  Location: [place] [facility];  Service: Cardiovascular;  Laterality: N/A;   ESOPHAGOGASTRODUODENOSCOPY N/A 09/01/2013   Procedure: ESOPHAGOGASTRODUODENOSCOPY (EGD);  Surgeon: Beryle Beams, MD;  Location: [facility] ENDOSCOPY;  Service: Endoscopy;  Laterality: N/A;  bedside   ESOPHAGOGASTRODUODENOSCOPY (EGD) WITH PROPOFOL N/A 05/26/2021   Procedure: ESOPHAGOGASTRODUODENOSCOPY (EGD) WITH PROPOFOL;  Surgeon: Carol Ada, MD;  Location: [place];  Service: Gastroenterology;  Laterality: N/A;   EYE SURGERY Bilateral    bilateral cataracts   INCISION AND DRAINAGE Left 04/24/2021   Procedure: INCISION AND DRAINAGE;  Surgeon: Landis Martins, DPM;  Location: [place];  Service: Podiatry;  Laterality: Left;   IRRIGATION AND DEBRIDEMENT FOOT Left 07/15/2021   Procedure: IRRIGATION AND DEBRIDEMENT FOOT;  Surgeon: Trula Slade, DPM;  Location: [place];  Service: Podiatry;  Laterality: Left;   LEFT HEART CATH AND CORONARY ANGIOGRAPHY N/A 11/19/2020   Procedure: LEFT HEART CATH AND CORONARY ANGIOGRAPHY;  Surgeon: Nelva Bush, MD;  Location: [place] [facility];  Service: Cardiovascular;  Laterality: N/A;   LEFT HEART CATHETERIZATION WITH CORONARY ANGIOGRAM N/A 08/30/2013   Procedure: LEFT HEART CATHETERIZATION WITH CORONARY ANGIOGRAM;  Surgeon: Leonie Man, MD;  Location: [facility] CATH LAB: 100% mRCA (thrombus - extends to RPAV), 80% D1, 40% AVG Cx.   PERCUTANEOUS CORONARY STENT INTERVENTION (PCI-S)  08/30/2013   Procedure: PERCUTANEOUS CORONARY STENT INTERVENTION (PCI-S);  Surgeon: Leonie Man, MD;  Location: [facility] CATH LAB;  Integrity Resolute DES 2.0 mm x 38 mm -- 3.35 mm.; PTCA of proximal RPA V. - 3.0 mm x 15 mm balloon   PERIPHERAL VASCULAR ATHERECTOMY  05/26/2019   Procedure: PERIPHERAL VASCULAR ATHERECTOMY;  Surgeon: Marty Heck, MD;  Location: [place] [facility];  Service: Cardiovascular;;  Left SFA   PERIPHERAL  VASCULAR BALLOON ANGIOPLASTY Left 07/11/2021   Procedure: PERIPHERAL VASCULAR BALLOON ANGIOPLASTY;  Surgeon: Marty Heck, MD;  Location: [place] [facility];  Service: Cardiovascular;  Laterality: Left;  PT TRUNK / AT   PERIPHERAL VASCULAR INTERVENTION  05/26/2019   Procedure: PERIPHERAL VASCULAR INTERVENTION;  Surgeon: Marty Heck, MD;  Location: [place] [facility];  Service: Cardiovascular;;  Left SFA   PERIPHERAL VASCULAR INTERVENTION Right 11/15/2020   Procedure: PERIPHERAL VASCULAR INTERVENTION;  Surgeon: Marty Heck, MD;  Location: [place] [facility];  Service: Cardiovascular;  Laterality: Right;  Superficial Femoral Artery   PERIPHERAL VASCULAR INTERVENTION Left 07/11/2021   Procedure: PERIPHERAL VASCULAR INTERVENTION;  Surgeon: Marty Heck, MD;  Location: [place] [facility];  Service: Cardiovascular;  Laterality: Left;  SFA   PITUITARY SURGERY     TEE WITHOUT CARDIOVERSION N/A 04/26/2021   Procedure: TRANSESOPHAGEAL ECHOCARDIOGRAM (TEE);  Surgeon: Buford Dresser, MD;  Location: [facility] ENDOSCOPY;  Service: Cardiovascular;  Laterality: N/A;   TRANSTHORACIC ECHOCARDIOGRAM  08/30/2013   mild LVH. EF 50-55%. Moderate HK of the entire inferior myocardium. GR 1 DD. Mild LA dilation. Mildly reduced RV function   TYMPANOMASTOIDECTOMY Right 12/28/2017   Procedure: RIGHT TYMPANOMASTOIDECTOMY;  Surgeon: Leta Baptist, MD;  Location: [facility];  Service: ENT;  Laterality: Right;   Social History:  reports that she has quit smoking. She has never used smokeless tobacco. She reports that she does not drink alcohol and does not use drugs.  Family / Support Systems Marital Status: Widow/Widower Patient Roles: Parent, Other (Comment) (grandmother) Children: [name]-daughrer [phone]  Lupita Dawn [phone]  Ebony-daughter [phone] Other Supports: Lazaria-granddaughter [phone] Anticipated Caregiver: granddaughter to move in with short time but is doing her  clinicals for OT degree graduates this 02/2022 may be time will be alone Ability/Limitations of Caregiver: Granddaughter doing clinicals for OT degree during the day gone, others can check in on her Caregiver Availability: Evenings only Family Dynamics: Close with children and extended family they will make sure she has waht she needs and will try to provide almost 24/7. Pt feels she can get to the level she will not need someone there all of the time  Social History Preferred language: English Religion: Holiness Cultural Background: No issues Education: [place] - How often do you need to have someone help you when you read instructions, pamphlets, or other written material from your doctor or pharmacy?: Never Writes: Yes Employment Status: Retired Public relations account executive Issues: No issues Guardian/Conservator: None-according to MD pt is capable of makking her own decisions while here. Family will be here daily to provide support to pt   Abuse/Neglect Abuse/Neglect Assessment Can Be Completed: Yes Physical Abuse: Denies Verbal Abuse: Denies Sexual Abuse: Denies Exploitation of patient/patient's resources: Denies Self-Neglect: Denies  Patient response to: Social Isolation - How often do you feel lonely or isolated from those around you?: Never  Emotional Status Pt's affect, behavior and adjustment status: Pt has always been independent and taken care of herself until this and they tried tosave her leg but just could not. Pt is ready to move forward and rehab to regain her independence. Recent Psychosocial Issues: other health issues Psychiatric History: No issues/history may benefit from seeing neuro-psych while here Substance Abuse History: No issues  Patient / Family Perceptions, Expectations & Goals Pt/Family understanding of illness & functional limitations: Pt is able to explain her amputation and issues resulting from this her leg has bothered her a long time and  MD tried to save it. She feels it is for the best and is ready to move on and recover. Premorbid pt/family roles/activities: Mom, grandmother, retiree, church member, friend, neighbor, etc Anticipated changes in roles/activities/participation: resume Pt/family expectations/goals: Pt states: " I hope to be as independent as I can be when I leave here."  [employer]: None Premorbid Home Care/DME Agencies: Other (Comment) (has rw, elevated commode and tub seat) Transportation available at discharge: pt did drive PTA-now her family will drive her Is the patient able to respond to transportation needs?: Yes In the past 12 months, has lack of transportation kept you from medical appointments or from getting medications?: No In the past 12 months, has lack of transportation kept you from meetings, work, or from getting things needed for daily living?: No  Discharge Planning Living Arrangements: Alone Support Systems: Children, Other relatives, Friends/neighbors, Social worker community Type of Residence: Private residence Insurance Resources: [employer], Multimedia programmer (specify) Printmaker) Financial Resources: Social Security Financial Screen Referred: No Living Expenses: Own Money Management: Patient Does the patient have any problems obtaining your medications?: No Home Management: self Patient/Family Preliminary Plans: Granddaugher plans on moving in with pt so at least someone is there at night. Her other family members will be checking in on  her and assisting when can. Pt hopes to be mod/i by discharge. Care Coordinator Barriers to Discharge: Decreased caregiver support Care Coordinator Anticipated Follow Up Needs: HH/OP, Support Group  Clinical Impression Pleasant motivated female who is ready to move forward and recover from her amputation. Her family is supportive but may not be with her 24/7, her granddaughter to move in and will be there in the evenings  but is gone during the day. Will work on discharge needs, pt can not afford a ramp if this is needed. Pt hopes to be mod/I wheelchair level by discharge.  Elease Hashimoto 01/20/2022, 9:19 AM

## 2022-01-20 NOTE — Progress Notes (Addendum)
PROGRESS NOTE   Subjective/Complaints:  Reports she feels a little constipated although had small BM this AM. Did not sleep much because he had to urinate several times at night, this is chronic issue.  Pain is well controlled.   ROS- neg CP, SOB, N/V/D, + constipation, no HA Objective:   No results found. Recent Labs    01/20/22 0547  WBC 8.1  HGB 8.3*  HCT 28.5*  PLT 671*    Recent Labs    01/20/22 0547  NA 141  K 3.5  CL 107  CO2 26  GLUCOSE 96  BUN 21  CREATININE 0.59  CALCIUM 8.6*     Intake/Output Summary (Last 24 hours) at 01/20/2022 0812 Last data filed at 01/20/2022 0700 Gross per 24 hour  Intake 1056 ml  Output 1 ml  Net 1055 ml         Physical Exam: Vital Signs Blood pressure (!) 149/57, pulse 67, temperature 98.4 F (36.9 C), temperature source Oral, resp. rate 17, height '5\' 7"'$  (1.702 m), weight 83 kg, SpO2 99 %.   General: No acute distress, seen at bedside Mood and affect are appropriate, pleasant Heart: IRRR, no rubs murmurs or extra sounds Lungs: Clear to auscultation, breathing unlabored, no rales or wheezes, non-labored Abdomen: Positive bowel sounds, soft nontender to palpation, nondistended Extremities: No clubbing, cyanosis, or edema Skin: No evidence of breakdown, no evidence of rash Neurologic: Cranial nerves II through XII intact, motor strength is 5/5 in bilateral deltoid, bicep, tricep, grip, right hip flexor, knee extensors, ankle dorsiflexor and plantar flexor, 4/5 left HF  Musculoskeletal: left BKA mild distal edema with oozing at several sites along the joint line    Assessment/Plan: 1. Functional deficits which require 3+ hours per day of interdisciplinary therapy in a comprehensive inpatient rehab setting. Physiatrist is providing close team supervision and 24 hour management of active medical problems listed below. Physiatrist and rehab team continue to assess  barriers to discharge/monitor patient progress toward functional and medical goals  Care Tool:  Bathing    Body parts bathed by patient: Left arm, Right arm, Chest, Abdomen, Front perineal area, Left upper leg, Right upper leg, Face   Body parts bathed by helper: Right lower leg, Buttocks Body parts n/a: Left lower leg   Bathing assist Assist Level: Minimal Assistance - Patient > 75%     Upper Body Dressing/Undressing Upper body dressing   What is the patient wearing?: Pull over shirt    Upper body assist Assist Level: Contact Guard/Touching assist    Lower Body Dressing/Undressing Lower body dressing      What is the patient wearing?: Pants, Incontinence brief     Lower body assist Assist for lower body dressing: Moderate Assistance - Patient 50 - 74%     Toileting Toileting    Toileting assist Assist for toileting: Maximal Assistance - Patient 25 - 49%     Transfers Chair/bed transfer  Transfers assist     Chair/bed transfer assist level: Minimal Assistance - Patient > 75%     Locomotion Ambulation   Ambulation assist      Assist level: Moderate Assistance - Patient 50 - 74% Assistive device:  Walker-rolling Max distance: 93f   Walk 10 feet activity   Assist     Assist level: Moderate Assistance - Patient - 50 - 74% Assistive device: Walker-rolling   Walk 50 feet activity   Assist Walk 50 feet with 2 turns activity did not occur: Safety/medical concerns         Walk 150 feet activity   Assist Walk 150 feet activity did not occur: Safety/medical concerns         Walk 10 feet on uneven surface  activity   Assist Walk 10 feet on uneven surfaces activity did not occur: Safety/medical concerns         Wheelchair     Assist Is the patient using a wheelchair?: Yes Type of Wheelchair: Manual    Wheelchair assist level: Minimal Assistance - Patient > 75% Max wheelchair distance: 150    Wheelchair 50 feet with 2 turns  activity    Assist        Assist Level: Minimal Assistance - Patient > 75%   Wheelchair 150 feet activity     Assist      Assist Level: Minimal Assistance - Patient > 75%   Blood pressure (!) 149/57, pulse 67, temperature 98.4 F (36.9 C), temperature source Oral, resp. rate 17, height '5\' 7"'$  (1.702 m), weight 83 kg, SpO2 99 %.    Medical Problem List and Plan: 1. 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  Mod I / supervision with PT and OT             -Cont CIR PT, OT             -Wound vac was removed, surgery considering leaving sutures in place 4 weeks postop depending on wound healing             -NWB LLE 2.  Antithrombotics: -DVT/anticoagulation:  Pharmaceutical: Eliquis resumed on 10/27             -antiplatelet therapy: Plavix 3. Pain Management:  hydrocodone prn.  4. Mood/Behavior/Sleep:  LCSW to follow for evaluation and support.              -antipsychotic agents:  N?A 5. Neuropsych/cognition: This patient is capable of making decisions on her own behalf. 6. Skin/Wound Care: Monitor wound for healing.   --Protein supplements and vitamins added to promote healing.  7. Fluids/Electrolytes/Nutrition: Monitor I/O. Check CMET on 10/30 8.  CAD: Monitor for symptoms with increase in activity. Continue ASA, Plavix, Lipitor. Nitroglycerin PRN. Denies chest pain 9. Hypotension: Monitor BP TID.  Has been stable with midodrine BID.  10 T2DM: Hgb A1C- 5.4 and well controlled.              --Continue Insulin Glargine will adjust to 22 units from 24 units with SSI for elevated BS.              --10/30 well controlled, continue current medications    Latest Ref Rng & Units 01/20/2022    5:47 AM 01/17/2022    7:35 AM 01/16/2022    5:30 AM  CBC  WBC 4.0 - 10.5 K/uL 8.1  7.6  8.5   Hemoglobin 12.0 - 15.0 g/dL 8.3  8.0  7.2   Hematocrit 36.0 - 46.0 % 28.5  26.6  24.7   Platelets  150 - 400 K/uL 671  465  434    .  11. ABLA on anemia of chronic disease: Stable stable in 7.2->8.0 today.  --Monitor for signs of bleeding as Eliquis and Plavix resumed 10/27. If Hgb drops may need to hold plavix --h/o [facility] (Dr. Collene Mares) -Continue ferrous sulfate -10/30 HGB up to 8.3 12. PAD: Continue Plavix and lipitor 13. Gastroparesis?: Was on reglan PRN PTA.  14. Constipation             -Sorbitol ordered  -10/30 schedule miralax  daily 15. Hypothyroidism             -Continue synthroid  16. Afib             -Continue Eliquis  17. Thrombocytosis, likely reactive, continue to monitor  18. Chronic urinary frequency  -Order purwick at night  LOS: 3 days A FACE TO FACE EVALUATION WAS PERFORMED  Jennye Boroughs 01/20/2022, 8:12 AM

## 2022-01-20 NOTE — Progress Notes (Signed)
Inpatient Rehabilitation Center Individual Statement of Services  Patient Name:  RIKITA GRABERT  Date:  01/20/2022  Welcome to the [facility].  Our goal is to provide you with an individualized program based on your diagnosis and situation, designed to meet your specific needs.  With this comprehensive rehabilitation program, you will be expected to participate in at least 3 hours of rehabilitation therapies Monday-Friday, with modified therapy programming on the weekends.  Your rehabilitation program will include the following services:  Physical Therapy (PT), Occupational Therapy (OT), 24 hour per day rehabilitation nursing, Therapeutic Recreaction (TR), Neuropsychology, Care Coordinator, Rehabilitation Medicine, Nutrition Services, and Pharmacy Services  Weekly team conferences will be held on Wednesday to discuss your progress.  Your Inpatient Rehabilitation Care Coordinator will talk with you frequently to get your input and to update you on team discussions.  Team conferences with you and your family in attendance may also be held.  Expected length of stay: 9-12 days  Overall anticipated outcome: mod/I-supervision level  Depending on your progress and recovery, your program may change. Your Inpatient Rehabilitation Care Coordinator will coordinate services and will keep you informed of any changes. Your Inpatient Rehabilitation Care Coordinator's name and contact numbers are listed  below.  The following services may also be recommended but are not provided by the [facility] will be made to provide these services after discharge if needed.  Arrangements include referral to agencies that provide these services.  Your insurance has been verified to be:  [place] Your primary doctor is:  PCP not in the system  Pertinent  information will be shared with your doctor and your insurance company.  Inpatient Rehabilitation Care Coordinator:  Ovidio Kin, [place] or Emilia Beck  Information discussed with and copy given to patient by: Elease Hashimoto, 01/20/2022, 9:21 AM

## 2022-01-20 NOTE — Progress Notes (Signed)
Patient ID: Tammy Good, female   DOB: 21-Jul-1950, 71 y.o.   MRN: [id number] Met with the patient and family to review current status, rehab process, team conference and plan of care. Discussed secondary risk management including HTN, HLD and DM (A1 C 5.4) Patient has a freestyle libre monitor. Reviewed medications and dietary modification recommendations including iron rich foods and constipation management. Continue to follow along to address educational needs; will need information and practice on incision care before discharge. Margarito Liner'

## 2022-01-20 NOTE — IPOC Note (Signed)
Overall Plan of Care [facility]) Patient Details Name: Tammy Good MRN: [id number] DOB: 04/13/1950  Admitting Diagnosis: Unilateral complete BKA, left, initial encounter [facility])  Hospital Problems: Principal Problem:   Unilateral complete BKA, left, initial encounter [facility])     Functional Problem List: Nursing Bladder, Bowel, Medication Management, Endurance, Pain, Skin Integrity, Safety  PT Balance, Edema, Endurance, Pain, Sensory, Skin Integrity  OT Balance, Endurance, Edema, Motor, Pain, Safety, Skin Integrity  SLP    TR         Basic ADL's: OT Bathing, Dressing, Toileting     Advanced  ADL's: OT       Transfers: PT Bed Mobility, Bed to Chair, Car, Furniture, Floor  OT Toilet, Metallurgist: PT Ambulation, Emergency planning/management officer, Stairs     Additional Impairments: OT None  SLP        TR      Anticipated Outcomes Item Anticipated Outcome  Self Feeding n/a-alreafy independent  Swallowing      Basic self-care  supervision  Toileting  supervision   Bathroom Transfers supervision  Bowel/Bladder  manage bowel w mod I assist and bladder w toileting  Transfers  Supervision assist  Locomotion  Mod I Wheelchair level.  CGA ambulatory for short distances with RW  Communication     Cognition     Pain  < 4 with prns  Safety/Judgment  manage w cues   Therapy Plan: PT Intensity: Minimum of 1-2 x/day ,45 to 90 minutes PT Frequency: 5 out of 7 days PT Duration Estimated Length of Stay: 9-12 day OT Intensity: Minimum of 1-2 x/day, 45 to 90 minutes OT Frequency: 5 out of 7 days OT Duration/Estimated Length of Stay: 7-10     Team Interventions: Nursing Interventions Bladder Management, Patient/Family Education, Bowel Management, Disease Management/Prevention, Pain Management, Medication Management, Skin Care/Wound Management, Discharge Planning  PT interventions Ambulation/gait training, Balance/vestibular training, Community reintegration, Discharge  planning, Disease management/prevention, DME/adaptive equipment instruction, Pain management, Functional mobility training, Neuromuscular re-education, Patient/family education, Psychosocial support, Skin care/wound management, Splinting/orthotics, Stair training, Therapeutic Activities, Therapeutic Exercise, UE/LE Strength taining/ROM, UE/LE Coordination activities, Wheelchair propulsion/positioning  OT Interventions Training and development officer, Cognitive remediation/compensation, Community reintegration, Discharge planning, Disease mangement/prevention, DME/adaptive equipment instruction, Functional electrical stimulation, Functional mobility training, Neuromuscular re-education, Pain management, Patient/family education, Psychosocial support, Self Care/advanced ADL retraining, Skin care/wound managment, Splinting/orthotics, Therapeutic Activities, Therapeutic Exercise, UE/LE Strength taining/ROM, UE/LE Coordination activities, Visual/perceptual remediation/compensation, Wheelchair propulsion/positioning  SLP Interventions    TR Interventions    SW/CM Interventions Discharge Planning, Psychosocial Support, Patient/Family Education   Barriers to Discharge MD  Medical stability, Home enviroment access/loayout, Wound care, and Weight bearing restrictions  Nursing Decreased caregiver support, Home environment access/layout, Wound Care, Weight bearing restrictions 1 level 3/4 ste left rail w grand daughter  PT Inaccessible home environment, Decreased caregiver support, Home environment access/layout, Wound Care, Weight, Weight bearing restrictions    OT      SLP      SW Decreased caregiver support     Team Discharge Planning: Destination: PT-Home ,OT- Home , SLP-  Projected Follow-up: PT-Home health PT, OT-  Home health OT, SLP-  Projected Equipment Needs: PT-Rolling walker with 5" wheels, Wheelchair (measurements), Wheelchair cushion (measurements), OT- To be determined, SLP-  Equipment Details:  PT- , OT-has RW, shower chair, SPC, adn BSC Patient/family involved in discharge planning: PT- Patient,  OT-Patient, SLP-   MD ELOS: 7-10 Medical Rehab Prognosis:  Excellent Assessment: The patient has been admitted for CIR therapies with the  diagnosis of L BKA. The team will be addressing functional mobility, strength, stamina, balance, safety, adaptive techniques and equipment, self-care, bowel and bladder mgt, patient and caregiver education. Goals have been set at [place]. Anticipated discharge destination is home.        See Team Conference Notes for weekly updates to the plan of care

## 2022-01-20 NOTE — Progress Notes (Signed)
Occupational Therapy Session Note  Patient Details  Name: Tammy Good MRN: [id number] Date of Birth: Jun 26, 1950  Session 1 Today's Date: 01/20/2022 OT Individual Time: 7096-2836 OT Individual Time Calculation (min): 70 min   Session 2 Today's Date: 01/20/2022 OT Individual Time: 1303-1405 OT Individual Time Calculation (min): 62 min    Short Term Goals: Week 1:  OT Short Term Goal 1 (Week 1): LTG=STG 2/2 ELOS  Skilled Therapeutic Interventions/Progress Updates:    Session 1 Pt received supine with no c/o pain, agreeable to OT session. Re-wrapped her LLE with figure 8 technique and provided limb loss education for edema management, desensitization, and positioning. Discussed home set up- pt reporting granddaughter is already helping her get a TTB to use in her tub shower. Pt completed bed mobility to EOB with (S) using bed rail. Sit > stand from EOB with min stabilization assist at her trunk. She used the RW for stand pivot with min A, min cueing for safe descent to chair. Oral care at sink seated with set up assist. She completed a stand pivot transfer into the shower with min A using the RW. She completed bathing seated on the TTB with (S). She required min A for balance stabilization when standing and washing peri areas. She completed a transfer back to the w/c with CGA with use of the grab bar. She was able to don a shirt with set up assist and min A to don pants- for balance assist standing only. Pt reporting her R knee is becoming sore with increased standing trials. Discussed pacing and rest breaks. Pt able to stand x3 with min- CGA with the RW. Pt was able to propel the w/c 100 ft at a slow pace but no cueing required. She completed a stand pivot transfer to the mat with CGA using the RW. She completed core stability exercises with a 4lb weighted ball with core rotation. Activity performed to improve sitting/standing balance required for ADLs/IADLs. Provided pt with a home  measurement sheet for her family to complete. She returned to her w/c and to her room. She was left sitting up with all needs met.     Session 2 Pt received sitting in the w/c with no co pain at rest. She had questions re protein supplements. Several questions answered within OT scope. Encouraged increased protein while dietary rep was in room to increase wound healing. She was able to propel the w/c 150 ft with (S) with several rest breaks. Demo provided re a transfer into the tub using the TTB to simulate her home environment. She was able to return the demo with min A overall and min cueing for technique. She was then taken back to the therapy gym. She completed 5 hops forward and back in the parallel bars with min A. 3/10 pain in her residual limb reported with no intervention required. Extended rest break provided following. She completed 1 more set with 3 hops forward and back with min A overall. Education provided re benefits of mirror therapy. Pt interested to try for phantom sensations in her L LE. She completed 2 sets of AROM with the RLE while looking at the LLE to provide visual feedback. She really enjoyed these and reported improvement in phantom itching. She then completed BUE strengthening circuit with a 5 lb dowel, reaching overhead 3x10 repetitions to increase UE strength needed for UE support on the RW. Pt returned to her room and was left supine with all needs met.    Therapy  Documentation Precautions:  Precautions Precautions: Fall Restrictions Weight Bearing Restrictions: No LLE Weight Bearing: Non weight bearing   Therapy/Group: Individual Therapy  Curtis Sites 01/20/2022, 6:46 AM

## 2022-01-21 DIAGNOSIS — R350 Frequency of micturition: Secondary | ICD-10-CM

## 2022-01-21 DIAGNOSIS — E11621 Type 2 diabetes mellitus with foot ulcer: Secondary | ICD-10-CM | POA: Diagnosis not present

## 2022-01-21 DIAGNOSIS — K5903 Drug induced constipation: Secondary | ICD-10-CM | POA: Diagnosis not present

## 2022-01-21 DIAGNOSIS — S88112A Complete traumatic amputation at level between knee and ankle, left lower leg, initial encounter: Secondary | ICD-10-CM | POA: Diagnosis not present

## 2022-01-21 LAB — GLUCOSE, CAPILLARY
Glucose-Capillary: 109 mg/dL — ABNORMAL HIGH (ref 70–99)
Glucose-Capillary: 141 mg/dL — ABNORMAL HIGH (ref 70–99)
Glucose-Capillary: 147 mg/dL — ABNORMAL HIGH (ref 70–99)
Glucose-Capillary: 178 mg/dL — ABNORMAL HIGH (ref 70–99)

## 2022-01-21 NOTE — Progress Notes (Signed)
Occupational Therapy Note  Patient Details  Name: BELLAMI FARRELLY MRN: [id number] Date of Birth: March 22, 1951  Today's Date: 01/21/2022 OT Missed Time: 74 Minutes Missed Time Reason: Other (comment) (pt had just ordered new lunch tray)  Pt greeted seated in w/c, pt had just ordered new lunch tray requesting to finish tray prior to session. Will f/u as time allows to make up for missed minutes.    Corinne Ports [facility] 01/21/2022, 3:36 PM

## 2022-01-21 NOTE — Progress Notes (Signed)
Physical Therapy Session Note  Patient Details  Name: Tammy Good MRN: [id number] Date of Birth: June 12, 1950  Today's Date: 01/21/2022 PT Individual Time: 1415-1530 PT Individual Time Calculation (min): 75 min   Short Term Goals: Week 1:  PT Short Term Goal 1 (Week 1): Pt will transfer to and from [facility] with CGA PT Short Term Goal 2 (Week 1): Pt will ambulate 41f with min assist and LRAD PT Short Term Goal 3 (Week 1): pt will perform stair management with use of shower chair  Skilled Therapeutic Interventions/Progress Updates:  Patient greeted sitting on bedside commode in room with successful BM- Patient performed sit/stand with RW and CGA/MinA. While standing NT performed pericare. Patient returned to a seated position and therapist threaded shorts/brief. Patient performed sit/stand with RW and ModA for pulling up shorts/brief. Patient then performed stand pivot transfer from commode to wheelchair with RW and CGA. Patient propelled manual wheelchair from her room to main rehab gym (>150') with B UE and supv- Increased time required to complete secondary to fatigue and decreased UE endurance.   Patient gait trained 2 x 6' with RW and CGA/MinA for stability- Patient demonstrated decreased foot clearance and hop length secondary to decreased LE strength and decreased UE strength. Extended seated rest break required in between gait trials.   Patient performed sit/stand and stand pivot transfer with RW and CGA/MinA- VC for placing a hand back prior to sitting.   Patient transitioned to/from sitting edge of mat table and supine with supv. Patient was able to transition to/from supine and prone with supv for safety.   Supine therex performed in order to increase B LE strength for improved functional mobility-  R SAQ with 3#, 3 x 10 L Hip flexion with 2#, 3 x 10 L SAQ with 2#, 3 x 10 Prone L hip extension AROM, 3 x 10  Patient returned to her room requesting to use the restroom- Patient  performed stand pivot transfer with RW and MinA from wc to bedside commode. Patient agreeable to using her call button when finished. NT notified and aware of patient positioning.    Therapy Documentation Precautions:  Precautions Precautions: Fall Restrictions Weight Bearing Restrictions: Yes LLE Weight Bearing: Non weight bearing  Therapy/Group: Individual Therapy  Zacory Fiola 01/21/2022, 8:00 AM

## 2022-01-21 NOTE — Progress Notes (Signed)
Occupational Therapy Session Note  Patient Details  Name: Tammy Good MRN: [id number] Date of Birth: 1951/03/10  Today's Date: 01/21/2022 OT Individual Time: 6568-1275 OT Individual Time Calculation (min): 115 min    Short Term Goals: Week 1:  OT Short Term Goal 1 (Week 1): LTG=STG 2/2 ELOS  Skilled Therapeutic Interventions/Progress Updates:    Pt participated a in community outing where she was taken via w/c to the [employer] day[facility] to participate in trunk or treat event. Session focused on education re community accessibility especially at w/c level, w/c management, and safety/fall risk reduction. Session also used to increase self efficacy, mood, and psychosocial adjustment to limb loss. During the event pt was able to stand numerous times with CGA level overall using the RW. She completed 4x10 LLE hip abduction and extension with CGA support at the trunk. She was able to complete anterior weight shifts to address trunk control while handing out candy to the children. She was able to hand out candy in standing with single UE support with min A for trunk control. She returned inside and was left sitting up in her room, reporting the event made her day!   Therapy Documentation Precautions:  Precautions Precautions: Fall Restrictions Weight Bearing Restrictions: Yes LLE Weight Bearing: Non weight bearing  Therapy/Group: Individual Therapy  Curtis Sites 01/21/2022, 6:38 AM

## 2022-01-21 NOTE — Progress Notes (Signed)
PROGRESS NOTE   Subjective/Complaints:  No new complaints this AM. Reports she slept better with purewick.   ROS- neg CP,cough,  SOB, N/V/D, + constipation, no HA Objective:   No results found. Recent Labs    01/20/22 0547  WBC 8.1  HGB 8.3*  HCT 28.5*  PLT 671*    Recent Labs    01/20/22 0547  NA 141  K 3.5  CL 107  CO2 26  GLUCOSE 96  BUN 21  CREATININE 0.59  CALCIUM 8.6*     Intake/Output Summary (Last 24 hours) at 01/21/2022 0758 Last data filed at 01/21/2022 0327 Gross per 24 hour  Intake 620 ml  Output 800 ml  Net -180 ml         Physical Exam: Vital Signs Blood pressure (!) 181/88, pulse 76, temperature 98.1 F (36.7 C), temperature source Oral, resp. rate 18, height '5\' 7"'$  (1.702 m), weight 83.5 kg, SpO2 100 %.   General: No acute distress, seen at bedside Mood and affect are appropriate, pleasant Heart: IRRR, no rubs murmurs or extra sounds Lungs: Clear to auscultation, breathing unlabored, no rales or wheezes, non-labored Abdomen: Positive bowel sounds, soft nontender to palpation, nondistended Extremities: No clubbing, cyanosis, or edema Skin: No evidence of breakdown, no evidence of rash Neurologic: Cranial nerves II through XII intact, motor strength is 5/5 in bilateral deltoid, bicep, tricep, grip, right hip flexor, knee extensors, ankle dorsiflexor and plantar flexor, 4/5 left HF  Musculoskeletal: 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 Functional deficits which require 3+ hours per day of interdisciplinary therapy in a comprehensive inpatient rehab setting. Physiatrist is providing close team supervision and 24 hour management of active medical problems listed below. Physiatrist and rehab team continue to assess barriers to discharge/monitor patient progress toward functional and medical goals  Care Tool:  Bathing     Body parts bathed by patient: Left arm, Right arm, Chest, Abdomen, Front perineal area, Left upper leg, Right upper leg, Face   Body parts bathed by helper: Right lower leg, Buttocks Body parts n/a: Left lower leg   Bathing assist Assist Level: Minimal Assistance - Patient > 75%     Upper Body Dressing/Undressing Upper body dressing   What is the patient wearing?: Pull over shirt    Upper body assist Assist Level: Contact Guard/Touching assist    Lower Body Dressing/Undressing Lower body dressing      What is the patient wearing?: Pants, Incontinence brief     Lower body assist Assist for lower body dressing: Moderate Assistance - Patient 50 - 74%     Toileting Toileting    Toileting assist Assist for toileting: Maximal Assistance - Patient 25 - 49%     Transfers Chair/bed transfer  Transfers assist     Chair/bed transfer assist level: Minimal Assistance - Patient > 75%     Locomotion Ambulation   Ambulation assist      Assist level: Moderate Assistance - Patient 50 - 74% Assistive device: Walker-rolling Max distance: 23f   Walk 10 feet activity   Assist     Assist level: Moderate Assistance - Patient -  50 - 74% Assistive device: Walker-rolling   Walk 50 feet activity   Assist Walk 50 feet with 2 turns activity did not occur: Safety/medical concerns         Walk 150 feet activity   Assist Walk 150 feet activity did not occur: Safety/medical concerns         Walk 10 feet on uneven surface  activity   Assist Walk 10 feet on uneven surfaces activity did not occur: Safety/medical concerns         Wheelchair     Assist Is the patient using a wheelchair?: Yes Type of Wheelchair: Manual    Wheelchair assist level: Minimal Assistance - Patient > 75% Max wheelchair distance: 150    Wheelchair 50 feet with 2 turns activity    Assist        Assist Level: Minimal Assistance - Patient > 75%   Wheelchair 150 feet  activity     Assist      Assist Level: Minimal Assistance - Patient > 75%   Blood pressure (!) 181/88, pulse 76, temperature 98.1 F (36.7 C), temperature source Oral, resp. rate 18, height '5\' 7"'$  (1.702 m), weight 83.5 kg, SpO2 100 %.    Medical Problem List and Plan: 1. 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  Mod I / supervision with PT and OT             -Cont CIR PT, OT             -Wound vac was removed, surgery considering leaving sutures in place 4 weeks postop depending on wound healing             -NWB LLE 2.  Antithrombotics: -DVT/anticoagulation:  Pharmaceutical: Eliquis resumed on 10/27             -antiplatelet therapy: Plavix 3. Pain Management:  hydrocodone prn.  4. Mood/Behavior/Sleep:  LCSW to follow for evaluation and support.              -antipsychotic agents:  N?A 5. Neuropsych/cognition: This patient is capable of making decisions on her own behalf. 6. Skin/Wound Care: Monitor wound for healing.   --Protein supplements and vitamins added to promote healing.  7. Fluids/Electrolytes/Nutrition: Monitor I/O. Check CMET on 10/30 8.  CAD: Monitor for symptoms with increase in activity. Continue ASA, Plavix, Lipitor. Nitroglycerin PRN. Denies chest pain 9. Hypotension: Monitor BP TID.  Has been stable with midodrine BID.  10 T2DM: Hgb A1C- 5.4 and well controlled.              --Continue Insulin Glargine will adjust to 22 units from 24 units with SSI for elevated BS.              --10/31 continue to be well controlled    Latest Ref Rng & Units 01/20/2022    5:47 AM 01/17/2022    7:35 AM 01/16/2022    5:30 AM  CBC  WBC 4.0 - 10.5 K/uL 8.1  7.6  8.5   Hemoglobin 12.0 - 15.0 g/dL 8.3  8.0  7.2   Hematocrit 36.0 - 46.0 % 28.5  26.6  24.7   Platelets 150 - 400 K/uL 671  465  434    .  11. ABLA on anemia of chronic disease: Stable stable in  7.2->8.0 today.  --Monitor  for signs of bleeding as Eliquis and Plavix resumed 10/27. If Hgb drops may need to hold plavix --h/o Hems (Dr. Collene Mares) -Continue ferrous sulfate -10/30 HGB up to 8.3 12. PAD: Continue Plavix and lipitor 13. Gastroparesis?: Was on reglan PRN PTA.  14. Constipation             -Sorbitol ordered  -10/30 schedule miralax  daily  -10/30 LBM yesterday, improved 15. Hypothyroidism             -Continue synthroid  16. Afib             -Continue Eliquis  17. Thrombocytosis, likely reactive, continue to monitor  18. Chronic urinary frequency  -Order purwick at night, sleeping better with this device  LOS: 4 days A FACE TO FACE EVALUATION WAS PERFORMED  Jennye Boroughs 01/21/2022, 7:58 AM

## 2022-01-22 DIAGNOSIS — M25561 Pain in right knee: Secondary | ICD-10-CM

## 2022-01-22 DIAGNOSIS — K5903 Drug induced constipation: Secondary | ICD-10-CM | POA: Diagnosis not present

## 2022-01-22 DIAGNOSIS — G8929 Other chronic pain: Secondary | ICD-10-CM

## 2022-01-22 DIAGNOSIS — E11621 Type 2 diabetes mellitus with foot ulcer: Secondary | ICD-10-CM | POA: Diagnosis not present

## 2022-01-22 DIAGNOSIS — S88112A Complete traumatic amputation at level between knee and ankle, left lower leg, initial encounter: Secondary | ICD-10-CM | POA: Diagnosis not present

## 2022-01-22 LAB — GLUCOSE, CAPILLARY
Glucose-Capillary: 117 mg/dL — ABNORMAL HIGH (ref 70–99)
Glucose-Capillary: 160 mg/dL — ABNORMAL HIGH (ref 70–99)
Glucose-Capillary: 201 mg/dL — ABNORMAL HIGH (ref 70–99)
Glucose-Capillary: 150 mg/dL — ABNORMAL HIGH (ref 70–99)

## 2022-01-22 MED ORDER — ENSURE MAX PROTEIN PO LIQD
11.0000 [oz_av] | Freq: Two times a day (BID) | ORAL | Status: DC
  Administered 2022-01-22 – 2022-01-30 (×15): 11 [oz_av] via ORAL

## 2022-01-22 MED ORDER — DICLOFENAC SODIUM 1 % EX GEL
4.0000 g | Freq: Four times a day (QID) | CUTANEOUS | Status: DC
  Administered 2022-01-22 – 2022-01-31 (×33): 4 g via TOPICAL
  Filled 2022-01-22: qty 100

## 2022-01-22 NOTE — Group Note (Signed)
Patient Details Name: BETTYJEAN STEFANSKI MRN: [id number] DOB: 1950/10/05 Today's Date: 01/22/2022  Time Calculation: OT Group Time Calculation OT Group Start Time: 9528 OT Group Stop Time: 1540 OT Group Time Calculation (min): 61 min    Group Description: Stress management: Pt participated in group session with a focus on stress mgmt, education provided on healthy coping strategies, and social interaction. Focus of session on providing coping strategies to manage new diagnosis to allow for improved mental health to increase overall quality of life . Discussed how to break down stressors into "daily hassles," "major life stressors" and "life circumstances" in an effort to allow pts to chunk their stressors into groups and determine where to best put their efforts/time when dealing with stress. Provided active listening, emotional support and therapeutic use of self. Offered education on factors that protect [place] against stress such as "daily uplifts," "healthy coping strategies" and "protective factors." Encouraged all group members to make an effort to actively recall one event from their day that was a daily uplift in an effort to protect their mindset from stressors as well as sharing this information with their caregivers to facilitate improved caregiver communication and decrease overall burden of care.  Issued pt handouts on healthy coping strategies to implement into routine.   Individual level documentation: Patient participated with full collaboration during session.   Pain: No pain     Precious Haws 01/22/2022, 4:10 PM

## 2022-01-22 NOTE — Progress Notes (Signed)
Physical Therapy Session Note  Patient Details  Name: Tammy Good MRN: [id number] Date of Birth: 11-16-1950  Today's Date: 01/22/2022 PT Individual Time: 1st Treatment Session: 0915-1000; 2nd Treatment Session: 1330-1400 PT Individual Time Calculation (min): 45 min; 30 min  Short Term Goals: Week 1:  PT Short Term Goal 1 (Week 1): Pt will transfer to and from [facility] with CGA PT Short Term Goal 2 (Week 1): Pt will ambulate 19f with min assist and LRAD PT Short Term Goal 3 (Week 1): pt will perform stair management with use of shower chair  Skilled Therapeutic Interventions/Progress Updates:  1st Treatment Session- Patient greeted sitting upright in wheelchair in room with RN student present and agreeable to PT treatment session. Patient had just had a successful BM on bedside commode and RN student was assisting with donning shorts. Patient performed sit/stand with RW and MinA secondary to reports of increased R LE fatigue and increased "crunching." While standing, therapist donned shorts with ModA.   Patient propelled manual wheelchair with B UE and supv- VC for improved propulsion technique.   Patient performed UE bike 5 x 2' on level 2.5-4 with B UE- Patient required frequent extended rest breaks throughout exercise secondary to reports of increased fatigue and difficulty.   Patient performed x4 squat pivots to/from wheelchair and mat table with Min/ModA- VC and demonstration for proper technique. Patient unable to complete squat pivot transfers with one scoot and requires 2-3 lateral scoots in order to get to the surface.   Patient transitioned to/from sitting edge of mat table and supine with supv.   While in sidelying patient performed L hip abduction with therapist facilitating neutral positioning, 5 x 5. Patient also performed sidelying hip flexion stetch, 3 x 60 seconds.   Patient returned to her room sitting upright in wheelchair with call bell within reach, tray table in front  and all needs met.   2nd Treatment Session- Patient greeted supine in room with RN and RN student present applying voltaren gel to patient's R knee to improve pain with mobility. Patient's son was on the phone and assisted with locating images of the steps in the front of the home in order to assist with discharge planning. Patient has 4 steps to enter her home with S HR on the L when ascending- Discussed shower chair method of ascending/descending steps vs ramp installation. Patient reported they are attempting to locate a ramp, however are having difficulty- CSW notified and aware.   Patient transitioned from supine to sitting EOB with supv. Patient performed squat pivot transfer from EOB to wheelchair with Min/ModA- Patient educated with demonstration increased anterior weight shift in order to improve bottom clearance. Patient then transferred to/from wheelchair and EOB with CGA and good bottom clearance each time- patient was also able to complete each transfer in one scoot, which is improved from this morning.   Patient reporting need to use the restroom- Patient performed sit/stand with RW and MinA then stand pivot transfer to/[facility] and [facility]. While standing, patient was able to doff brief and shorts. Patient was able stand with MinA from commode and perform pericare with CGA. Patient required a seated rest break prior to donning brief and shorts secondary to R LE fatigue and poor muscular endurance. Therapist [place].   Patient left sitting upright in wheelchair with call bell within reach and all needs met.    Therapy Documentation Precautions:  Precautions Precautions: Fall Restrictions Weight Bearing Restrictions:  Yes LLE Weight Bearing: Non weight bearing  Therapy/Group: Individual Therapy  Leopold Smyers 01/22/2022, 7:41 AM

## 2022-01-22 NOTE — Progress Notes (Signed)
Patient ID: Tammy Good, female   DOB: 06/23/1950, 71 y.o.   MRN: [id number]  Met with pt to update regarding team conference goals of mod/I-supervision level and target discharge date of 11/10. She will have son take a picture of her steps so PT can problem solve how she will get up them. Her other knee is bad and she is having issues with this buckling due to the activity here. Will work on discharge needs-equipment and follow up.

## 2022-01-22 NOTE — Progress Notes (Signed)
Occupational Therapy Session Note  Patient Details  Name: Tammy Good MRN: [id number] Date of Birth: 04/05/1950  Today's Date: 01/22/2022 OT Individual Time: 3536-1443 OT Individual Time Calculation (min): 75 min    Short Term Goals: Week 1:  OT Short Term Goal 1 (Week 1): LTG=STG 2/2 ELOS  Skilled Therapeutic Interventions/Progress Updates:    Pt received supine with 2/10 pain described as throbbing, no request for intervention, agreeable to OT session. Pt completed bed mobility to EOB with mod I using bed rail. Sit > stand with x3 attempts d/t bed being low. Crepitus audible in R knee. Min cueing for hand placement and she was able to stand with min A. Once on her foot she pivoted with light CGA. Another stand pivot with CGA overall into the shower. She completed bathing seated with (S), CGA when standing to wash peri areas. Discussed likely switch to shrinker sock soon- contacted surgeon's PA to confirm. Pt required min A to stand from the TTB d/t ongoing issues with R knee. She completed oral care from the w/c with set up assist. UB Dressing with set up assist. LB with min A for stabilization in standing. Pt demonstrating good improvement in standing balance overall by being able to release alternating UE. She was able to don shoe but required assist to tie them- discussed elastic lace and she was agreeable to try. OT changed out laces and pt liked them. She propelled the w/c to the therapy gym with (S), slow pace and several rest breaks, 200 ft. Stand pivot to the mat with CGA. Worked on lateral leans from the mat for carryover to peri hygiene at home in the shower without grab bars to assist with standing. She required mod cueing for technique but had good carryover overall. Will work on this further in the real tub. She returned to the w/c and to her room. She requested to get to the [facility] with CGA using the RW. Pt left with call bell within reach.    Therapy Documentation Precautions:   Precautions Precautions: Fall Restrictions Weight Bearing Restrictions: Yes LLE Weight Bearing: Non weight bearing  Therapy/Group: Individual Therapy  Curtis Sites 01/22/2022, 6:49 AM

## 2022-01-22 NOTE — Progress Notes (Signed)
PROGRESS NOTE   Subjective/Complaints: She had had some R knee pain with therapy that started yesterday.  LBM 11/1  ROS- neg CP,cough,  SOB, N/V/D, + constipation, no HA + R knee pain  Objective:   No results found. Recent Labs    01/20/22 0547  WBC 8.1  HGB 8.3*  HCT 28.5*  PLT 671*    Recent Labs    01/20/22 0547  NA 141  K 3.5  CL 107  CO2 26  GLUCOSE 96  BUN 21  CREATININE 0.59  CALCIUM 8.6*     Intake/Output Summary (Last 24 hours) at 01/22/2022 0820 Last data filed at 01/22/2022 0742 Gross per 24 hour  Intake 531 ml  Output 1850 ml  Net -1319 ml         Physical Exam: Vital Signs Blood pressure (!) 152/58, pulse 62, temperature 97.9 F (36.6 C), temperature source Oral, resp. rate 18, height '5\' 7"'$  (1.702 m), weight 82.6 kg, SpO2 100 %.   General: No acute distress, seen at bedside Mood and affect are appropriate, pleasant Heart: IRRR, no rubs murmurs or extra sounds Lungs: Clear to auscultation, breathing unlabored, no rales or wheezes, non-labored Abdomen: Positive bowel sounds, soft nontender to palpation, nondistended Extremities: No clubbing, cyanosis, or edema Skin: No evidence of breakdown, no evidence of rash Neurologic: Cranial nerves II through XII intact, motor strength is 5/5 in bilateral deltoid, bicep, tricep, grip, right hip flexor, knee extensors, ankle dorsiflexor and plantar flexor, 4/5 left HF  Musculoskeletal: 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 Functional deficits which require 3+ hours per day of interdisciplinary therapy in a comprehensive inpatient rehab setting. Physiatrist is providing close team supervision and 24 hour management of active medical problems listed below. Physiatrist and rehab team continue to assess barriers to discharge/monitor patient progress toward functional and  medical goals  Care Tool:  Bathing    Body parts bathed by patient: Left arm, Right arm, Chest, Abdomen, Front perineal area, Left upper leg, Right upper leg, Face, Buttocks, Right lower leg   Body parts bathed by helper: Right lower leg, Buttocks Body parts n/a: Left lower leg   Bathing assist Assist Level: Contact Guard/Touching assist     Upper Body Dressing/Undressing Upper body dressing   What is the patient wearing?: Pull over shirt    Upper body assist Assist Level: Set up assist    Lower Body Dressing/Undressing Lower body dressing      What is the patient wearing?: Underwear/pull up, Pants     Lower body assist Assist for lower body dressing: Minimal Assistance - Patient > 75%     Toileting Toileting    Toileting assist Assist for toileting: Minimal Assistance - Patient > 75%     Transfers Chair/bed transfer  Transfers assist     Chair/bed transfer assist level: Minimal Assistance - Patient > 75%     Locomotion Ambulation   Ambulation assist      Assist level: Moderate Assistance - Patient 50 - 74% Assistive device: Walker-rolling Max distance: 90f   Walk 10 feet activity   Assist  Assist level: Moderate Assistance - Patient - 50 - 74% Assistive device: Walker-rolling   Walk 50 feet activity   Assist Walk 50 feet with 2 turns activity did not occur: Safety/medical concerns         Walk 150 feet activity   Assist Walk 150 feet activity did not occur: Safety/medical concerns         Walk 10 feet on uneven surface  activity   Assist Walk 10 feet on uneven surfaces activity did not occur: Safety/medical concerns         Wheelchair     Assist Is the patient using a wheelchair?: Yes Type of Wheelchair: Manual    Wheelchair assist level: Minimal Assistance - Patient > 75% Max wheelchair distance: 150    Wheelchair 50 feet with 2 turns activity    Assist        Assist Level: Minimal Assistance -  Patient > 75%   Wheelchair 150 feet activity     Assist      Assist Level: Minimal Assistance - Patient > 75%   Blood pressure (!) 152/58, pulse 62, temperature 97.9 F (36.6 C), temperature source Oral, resp. rate 18, height '5\' 7"'$  (1.702 m), weight 82.6 kg, SpO2 100 %.    Medical Problem List and Plan: 1. 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  Mod I / supervision with PT and OT             -Cont CIR PT, OT             -Wound vac was removed, surgery considering leaving sutures in place 4 weeks postop depending on wound healing             -NWB LLE  -Team conference today  -Pt unable to afford ramp, she will ask family to take picture of her porch steps to help determine plan 2.  Antithrombotics: -DVT/anticoagulation:  Pharmaceutical: Eliquis resumed on 10/27             -antiplatelet therapy: Plavix 3. Pain Management:  hydrocodone prn.  4. Mood/Behavior/Sleep:  LCSW to follow for evaluation and support.              -antipsychotic agents:  N?A 5. Neuropsych/cognition: This patient is capable of making decisions on her own behalf. 6. Skin/Wound Care: Monitor wound for healing.   --Protein supplements and vitamins added to promote healing.  7. Fluids/Electrolytes/Nutrition: Monitor I/O. Check CMET on 10/30 8.  CAD: Monitor for symptoms with increase in activity. Continue ASA, Plavix, Lipitor. Nitroglycerin PRN. Denies chest pain 9. Hypotension: Monitor BP TID.  Has been stable with midodrine BID.  10 T2DM: Hgb A1C- 5.4 and well controlled.              --Continue Insulin Glargine will adjust to 22 units from 24 units with SSI for elevated BS.              --11/1 Well controlled overall    Latest Ref Rng & Units 01/20/2022    5:47 AM 01/17/2022    7:35 AM 01/16/2022    5:30 AM  CBC  WBC 4.0 - 10.5 K/uL 8.1  7.6  8.5   Hemoglobin 12.0 - 15.0 g/dL 8.3  8.0  7.2   Hematocrit 36.0  - 46.0 % 28.5  26.6  24.7   Platelets 150 - 400 K/uL 671  465  434    .  11. ABLA on anemia of chronic disease: Stable stable in 7.2->8.0 today.  --Monitor for signs of bleeding as Eliquis and Plavix resumed 10/27. If Hgb drops may need to hold plavix --h/o Hems (Dr. Collene Mares) -Continue ferrous sulfate -10/30 HGB up to 8.3 12. PAD: Continue Plavix and lipitor 13. Gastroparesis?: Was on reglan PRN PTA.  14. Constipation             -Sorbitol ordered  -10/30 schedule miralax  daily  -11/1 LMB today-improved 15. Hypothyroidism             -Continue synthroid  16. Afib             -Continue Eliquis  17. Thrombocytosis, likely reactive, continue to monitor  18. Chronic urinary frequency  -Order purwick at night, sleeping better with this device 19. K knee pain, likely OA  -11/1 Start voltaren gel  LOS: 5 days A FACE TO FACE EVALUATION WAS PERFORMED  Jennye Boroughs 01/22/2022, 8:20 AM

## 2022-01-22 NOTE — Group Note (Unsigned)
Patient Details Name: Tammy Good MRN: [id number] DOB: 05-08-1950 Today's Date: 01/22/2022  Time Calculation:        Group Description: Stress management: Pt participated in group session with a focus on stress mgmt, education provided on healthy coping strategies, and social interaction. Focus of session on providing coping strategies to manage new diagnosis to allow for improved mental health to increase overall quality of life . Discussed how to break down stressors into "daily hassles," "major life stressors" and "life circumstances" in an effort to allow pts to chunk their stressors into groups and determine where to best put their efforts/time when dealing with stress. Provided active listening, emotional support and therapeutic use of self. Offered education on factors that protect [place] against stress such as "daily uplifts," "healthy coping strategies" and "protective factors." Encouraged all group members to make an effort to actively recall one event from their day that was a daily uplift in an effort to protect their mindset from stressors as well as sharing this information with their caregivers to facilitate improved caregiver communication and decrease overall burden of care.  Issued pt handouts on healthy coping strategies to implement into routine.   Individual level documentation: Patient participated with {Patient Participation:[id number]} collaboration during session.   Pain:    Precautions:    Tammy Good 01/22/2022, 3:53 PM

## 2022-01-22 NOTE — Evaluation (Signed)
Recreational Therapy Assessment and Plan  Patient Details  Name: Tammy Good MRN: [id number] Date of Birth: 08-03-1950 Today's Date: 01/22/2022  Rehab Potential:  Good ELOS:   d/c 11/10  Assessment Hospital Problem: Principal Problem:   Unilateral complete BKA, left, initial encounter [facility])     Past Medical History:      Past Medical History:  Diagnosis Date   CAD S/P percutaneous coronary angioplasty 08/2013    100% mRCA - PCI Integrity Resolute DES 3.0 mm x 38 mm - 3.35 mm; PTCA of RPA V 2.0 mm x 15 mm   CHF (congestive heart failure) ([place])     Cholesteatoma      right   Diabetes mellitus type 2 in obese (HCC)      On insulin and Invokana   History of osteomyelitis L 5th Toe all 05/2019    s/p Partial Ray Amputation with partial closure; 6 wks Abx & LSFA Atherectomy/DEB PTA with Stent for focal dissection.   Hyperlipidemia with target LDL less than 70     Hypothyroidism (acquired)     Mild essential hypertension     Obesity (BMI 30-39.9) 11/17/2013   PAD (peripheral artery disease) ([place]) 05/26/2019    05/26/19: Abd AoGram- BLE runoff -> L SFA orbital atherectomy - PTA w/ DEB & Stent 6 x 40 Luttonix (for focal dissection) - patent Pop A with 3 V runoff. LEA Dopplers 01/03/2020: RABI (prev) 0.68 (0.53)/ RTBI (prev) 0.58 (0.33); LABI (prev) 0.80 (0.64), LTBI (prev) 0.64 (0.51); R mSFA ~50-74%, L mSFA 50-74%. Patent Prox SFA stent < 49% stenosis   Panhypopituitarism (HCC)     ST elevation myocardial infarction (STEMI) of inferior wall, subsequent episode of care ([place]) 08/2013    80% branch of D1, 40% mid AV groove circumflex, 100% RCA with subacute thrombus -- thrombus extending into RPA V with 100% occlusion after initial angioplasty of mid RCA ;; Post MI ECHO 6/9/'15: EF 50-55%, mild LVH with moderate HK of inferior wall, Gr1 DD, mild LA dilation; mildly reduced RV function    Past Surgical History:       Past Surgical History:  Procedure Laterality Date   ABDOMINAL  AORTOGRAM W/LOWER EXTREMITY N/A 05/26/2019    Procedure: ABDOMINAL AORTOGRAM W/LOWER EXTREMITY;  Surgeon: Marty Heck, MD;  Location: [place] [facility];  Service: Cardiovascular;  Laterality: N/A;   ABDOMINAL AORTOGRAM W/LOWER EXTREMITY N/A 11/15/2020    Procedure: ABDOMINAL AORTOGRAM W/LOWER EXTREMITY;  Surgeon: Marty Heck, MD;  Location: [place] [facility];  Service: Cardiovascular;  Laterality: N/A;   ABDOMINAL AORTOGRAM W/LOWER EXTREMITY N/A 04/18/2021    Procedure: ABDOMINAL AORTOGRAM W/LOWER EXTREMITY;  Surgeon: Marty Heck, MD;  Location: [place] [facility];  Service: Cardiovascular;  Laterality: N/A;   ABDOMINAL AORTOGRAM W/LOWER EXTREMITY Left 07/11/2021    Procedure: ABDOMINAL AORTOGRAM W/LOWER EXTREMITY;  Surgeon: Marty Heck, MD;  Location: [place] [facility];  Service: Cardiovascular;  Laterality: Left;   AMPUTATION Left 05/25/2019    Procedure: AMPUTATION RAY 5th;  Surgeon: Trula Slade, DPM;  Location: [place];  Service: Podiatry;  Laterality: Left;   AMPUTATION Left 01/14/2022    Procedure: LEFT BELOW THE KNEE AMPUTATION;  Surgeon: Erle Crocker, MD;  Location: [place];  Service: Orthopedics;  Laterality: Left;  LENGTH OF SURGERY: 90 MINUTES   APPLICATION OF WOUND VAC Left 01/14/2022    Procedure: WOUND VAC PLACEMENT;  Surgeon: Erle Crocker, MD;  Location: [place];  Service: Orthopedics;  Laterality:  Left;   BONE BIOPSY Left 04/24/2021    Procedure: BONE BIOPSY;  Surgeon: Landis Martins, DPM;  Location: [place];  Service: Podiatry;  Laterality: Left;  left ankle/fibula   BONE BIOPSY Left 07/15/2021    Procedure: LEFT FOOT BONE BIOPSY;  Surgeon: Trula Slade, DPM;  Location: [place];  Service: Podiatry;  Laterality: Left;   Cardiac Event Monitor   July-August 2015    Sinus rhythm with PVCs   CHOLECYSTECTOMY       COLONOSCOPY N/A 08/31/2013    Procedure: COLONOSCOPY;  Surgeon: Juanita Craver, MD;  Location: [facility] ENDOSCOPY;   Service: Endoscopy;  Laterality: N/A;   CORONARY STENT INTERVENTION N/A 11/19/2020    Procedure: CORONARY STENT INTERVENTION;  Surgeon: Nelva Bush, MD;  Location: [place] [facility];  Service: Cardiovascular;  Laterality: N/A;   ESOPHAGOGASTRODUODENOSCOPY N/A 09/01/2013    Procedure: ESOPHAGOGASTRODUODENOSCOPY (EGD);  Surgeon: Beryle Beams, MD;  Location: [facility] ENDOSCOPY;  Service: Endoscopy;  Laterality: N/A;  bedside   ESOPHAGOGASTRODUODENOSCOPY (EGD) WITH PROPOFOL N/A 05/26/2021    Procedure: ESOPHAGOGASTRODUODENOSCOPY (EGD) WITH PROPOFOL;  Surgeon: Carol Ada, MD;  Location: [place];  Service: Gastroenterology;  Laterality: N/A;   EYE SURGERY Bilateral      bilateral cataracts   INCISION AND DRAINAGE Left 04/24/2021    Procedure: INCISION AND DRAINAGE;  Surgeon: Landis Martins, DPM;  Location: [place];  Service: Podiatry;  Laterality: Left;   IRRIGATION AND DEBRIDEMENT FOOT Left 07/15/2021    Procedure: IRRIGATION AND DEBRIDEMENT FOOT;  Surgeon: Trula Slade, DPM;  Location: [place];  Service: Podiatry;  Laterality: Left;   LEFT HEART CATH AND CORONARY ANGIOGRAPHY N/A 11/19/2020    Procedure: LEFT HEART CATH AND CORONARY ANGIOGRAPHY;  Surgeon: Nelva Bush, MD;  Location: [place] [facility];  Service: Cardiovascular;  Laterality: N/A;   LEFT HEART CATHETERIZATION WITH CORONARY ANGIOGRAM N/A 08/30/2013    Procedure: LEFT HEART CATHETERIZATION WITH CORONARY ANGIOGRAM;  Surgeon: Leonie Man, MD;  Location: [facility] [place] CATH LAB: 100% mRCA (thrombus - extends to RPAV), 80% D1, 40% AVG Cx.   PERCUTANEOUS CORONARY STENT INTERVENTION (PCI-S)   08/30/2013    Procedure: PERCUTANEOUS CORONARY STENT INTERVENTION (PCI-S);  Surgeon: Leonie Man, MD;  Location: [facility] CATH LAB;  Integrity Resolute DES 2.0 mm x 38 mm -- 3.35 mm.; PTCA of proximal RPA V. - 3.0 mm x 15 mm balloon   PERIPHERAL VASCULAR ATHERECTOMY   05/26/2019    Procedure: PERIPHERAL VASCULAR ATHERECTOMY;  Surgeon: Marty Heck, MD;  Location: [place] [facility];  Service: Cardiovascular;;  Left SFA   PERIPHERAL VASCULAR BALLOON ANGIOPLASTY Left 07/11/2021    Procedure: PERIPHERAL VASCULAR BALLOON ANGIOPLASTY;  Surgeon: Marty Heck, MD;  Location: [place] [facility];  Service: Cardiovascular;  Laterality: Left;  PT TRUNK / AT   PERIPHERAL VASCULAR INTERVENTION   05/26/2019    Procedure: PERIPHERAL VASCULAR INTERVENTION;  Surgeon: Marty Heck, MD;  Location: [place] [facility];  Service: Cardiovascular;;  Left SFA   PERIPHERAL VASCULAR INTERVENTION Right 11/15/2020    Procedure: PERIPHERAL VASCULAR INTERVENTION;  Surgeon: Marty Heck, MD;  Location: [place] [facility];  Service: Cardiovascular;  Laterality: Right;  Superficial Femoral Artery   PERIPHERAL VASCULAR INTERVENTION Left 07/11/2021    Procedure: PERIPHERAL VASCULAR INTERVENTION;  Surgeon: Marty Heck, MD;  Location: [place] [facility];  Service: Cardiovascular;  Laterality: Left;  SFA   PITUITARY SURGERY       TEE WITHOUT CARDIOVERSION N/A 04/26/2021  Procedure: TRANSESOPHAGEAL ECHOCARDIOGRAM (TEE);  Surgeon: Buford Dresser, MD;  [facility] ENDOSCOPY;  Service: Cardiovascular;  Laterality: N/A;   TRANSTHORACIC ECHOCARDIOGRAM   08/30/2013    mild LVH. EF 50-55%. Moderate HK of the entire inferior myocardium. GR 1 DD. Mild LA dilation. Mildly reduced RV function   TYMPANOMASTOIDECTOMY Right 12/28/2017    Procedure: RIGHT TYMPANOMASTOIDECTOMY;  Surgeon: Leta Baptist, MD;  Location: [place];  Service: ENT;  Laterality: Right;      Assessment & Plan Clinical Impression: Patient is a 71 year old female with history of CAD, PAD with critical limb ischemia s/p right and left SFT and anterior tibial angioplasty, T2DM, MRSA bacteremia with prolonged rounds of antibiotics for severe septic arthritis with osteomyelitis and had maximized treatment therefore L-BKA recommended by Dr. Linus Salmons. She had  planned for surgery but admitted on 01/13/22 due to downward trend in Hgb to 7.0 and transfused with one unit PRBC. Eliquis and Plavix placed on hold and she underwent L-BKA by Dr. Lucia Gaskins on 01/14/22. Post op with minimal pain and H/H stable therefore Eliquis and Plavix resumed today.  She does not report phantom pain but has had an itching phantom sensation.  Reports she has not had a bowel movement in several days and feels constipated.  Therapy has been working with patient who continues to be limited by pain, fatigue and limb loss.  Patient transferred to CIR on 01/17/2022 .    Pt presents with decreased activity tolerance, decreased functional mobility, decreased balance, feelings of stress Limiting pt's independence with leisure/community pursuits.  Met with pt today to discuss  leisure education, activity analysis/modifications in preparation for upcoming discharge.  Also discussed the importance of social, emotional, spiritual health in addition to physical health and their effects on overall health and wellness.  Encouraged pt to be thinking ahead on how to address these needs once home.  Pt stated understanding.  Pt is excited about upcoming discharge and appreciative of the care she has received on CIR thus far.   Pt has participated in seasonal activities weekly as well as in a stress management group.  Pt appreciative of this sessions as well.   Plan  Min 1 x week >20 minutes  Recommendations for other services: None   Discharge Criteria: Patient will be discharged from TR if patient refuses treatment 3 consecutive times without medical reason.  If treatment goals not met, if there is a change in medical status, if patient makes no progress towards goals or if patient is discharged from hospital.  The above assessment, treatment plan, treatment alternatives and goals were discussed and mutually agreed upon: by patient  Calvin 01/22/2022, 3:57 PM

## 2022-01-23 LAB — GLUCOSE, CAPILLARY
Glucose-Capillary: 130 mg/dL — ABNORMAL HIGH (ref 70–99)
Glucose-Capillary: 227 mg/dL — ABNORMAL HIGH (ref 70–99)
Glucose-Capillary: 158 mg/dL — ABNORMAL HIGH (ref 70–99)
Glucose-Capillary: 186 mg/dL — ABNORMAL HIGH (ref 70–99)

## 2022-01-23 NOTE — Progress Notes (Signed)
Physical Therapy Session Note  Patient Details  Name: CELESE BANNER MRN: [id number] Date of Birth: [birth year]  Today's Date: 01/23/2022 PT Individual Time: 1100-1200 PT Individual Time Calculation (min): 60 min   Short Term Goals: Week 1:  PT Short Term Goal 1 (Week 1): Pt will transfer to and from [facility] with CGA PT Short Term Goal 2 (Week 1): Pt will ambulate 12f with min assist and LRAD PT Short Term Goal 3 (Week 1): pt will perform stair management with use of shower chair  Skilled Therapeutic Interventions/Progress Updates:  Patient greeted sitting upright in wheelchair in room and agreeable to PT treatment session. RN present administering medication prior to start of session. ACE wrap and gauze on residual limb noted to be sliding down residual limb- Therapist doffed gauze and bandage with RN present and noted minimal weeping with 4 small areas of open skin- RN notified and aware. New bandage applied and therapist re-wrapped ACE bandage.   Patient propelled manual wheelchair from room to day room gym with B UE and supv- Increased time required secondary to decreased strength and endurance.   Patient participated in [place] Festival in day room for community integration and improved overall affect/engagement- -Patient stood with RW and CGA while playing [place] order to improve dynamic stability- Cmpleted x3 trials with ~8-10 ping pong tosses each time with no LOB noted, however fatigued quickly.   -Patient sat in her wheelchair and participated in [place] with the use of a reacher in order to address dynamic sitting balance. Patient completed activity x2 trials with good sitting balance noted and completed x6 apples in 62 seconds and 55 seconds.   -Patient interacted with Dixie, the therapy dog, and performed dynamic reaching in order to pet the dog. Patient interacting appropriately with the [place] her handler.   -Patient was able to maneuver wheelchair throughout  the snack table and perform dynamic reaching for various snacks.   -Patient sat in her wheelchair and went through a crossword puzzle with therapist as a rest break.   Patient returned to her room requesting to use the restroom. Patient performed stand pivot transfer from wheelchair to commode over toilet with RW and CGA- ModA for doffing shorts and brief due to urgency. Patient left sitting on the commode and agreeable to pull cord when ready to stand- NT notified and aware of patient positioning.    Therapy Documentation Precautions:  Precautions Precautions: Fall Restrictions Weight Bearing Restrictions: Yes LLE Weight Bearing: Non weight bearing  Therapy/Group: Individual Therapy  Nerine Pulse 01/23/2022, 7:48 AM

## 2022-01-23 NOTE — Progress Notes (Signed)
Occupational Therapy Session Note  Patient Details  Name: Tammy Good MRN: [id number] Date of Birth: 07/11/1950  Today's Date: 01/23/2022 OT Individual Time: 0900-1000 OT Individual Time Calculation (min): 60 min    Short Term Goals: Week 1:  OT Short Term Goal 1 (Week 1): LTG=STG 2/2 ELOS  Skilled Therapeutic Interventions/Progress Updates:   Pt resting in bed upon OT arrival. Reported she showered yesterday and would like to complete AM self care sink side. Pt able to direct care for setting up routine and w/c for safe transfers as well as RW for supported standing. Pt moved to EOB from  supine with mod I. Transfer via lateral scoot to R side to w/c with close S from bed. Completed UB bathing, pullover shirt dressing and basic grooming with mod I seated sink side. Seated level LE bathing R LE and front peri region with set up and min A for buttocks modified standing with RW support. OT engaged pt with ACE wrap techniques for L residual limb with demonstration provided. Pt able to don LB pull on clothing, R sock and shoe with min A. Pain in B LE's  low 2/10 reported and med nurse applied medicated gel to R knee. OT transported pt to tub room for time management for TTB training. Pt on and off laterally with CGA and OT educated on water management techniques for curtain and soap task simplifications for falls prevention. Once back in room, pt left in w/c with all needs, call button and safety measures in place.   Therapy Documentation Precautions:  Precautions Precautions: Fall Restrictions Weight Bearing Restrictions: Yes LLE Weight Bearing: Non weight bearing    Therapy/Group: Individual Therapy  Barnabas Lister 01/23/2022, 7:44 AM

## 2022-01-23 NOTE — Progress Notes (Signed)
PROGRESS NOTE   Subjective/Complaints: She had had some R knee pain with therapy that started yesterday.  LBM 11/1  ROS- neg CP,cough,  SOB, N/V/D, + constipation, no HA + R knee pain  Objective:   No results found. No results for input(s): "WBC", "HGB", "HCT", "PLT" in the last 72 hours.  No results for input(s): "NA", "K", "CL", "CO2", "GLUCOSE", "BUN", "CREATININE", "CALCIUM" in the last 72 hours.   Intake/Output Summary (Last 24 hours) at 01/23/2022 0816 Last data filed at 01/23/2022 0512 Gross per 24 hour  Intake 177 ml  Output 1100 ml  Net -923 ml         Physical Exam: Vital Signs Blood pressure (!) 133/58, pulse 61, temperature 98.2 F (36.8 C), resp. rate 15, height '5\' 7"'$  (1.702 m), weight 86 kg, SpO2 100 %.   General: No acute distress, seen at bedside Mood and affect are appropriate, pleasant Heart: IRRR, no rubs murmurs or extra sounds Lungs: Clear to auscultation, breathing unlabored, no rales or wheezes, non-labored Abdomen: Positive bowel sounds, soft nontender to palpation, nondistended Extremities: No clubbing, cyanosis, or edema Skin: warm and dry Neurologic: Cranial nerves II through XII grossly intact, motor strength is 5/5 in bilateral deltoid, bicep, tricep, grip, right hip flexor, knee extensors, ankle dorsiflexor and plantar flexor, 4/5 left HF  Musculoskeletal: 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 Functional deficits which require 3+ hours per day of interdisciplinary therapy in a comprehensive inpatient rehab setting. Physiatrist is providing close team supervision and 24 hour management of active medical problems listed below. Physiatrist and rehab team continue to assess barriers to discharge/monitor patient progress toward functional and medical goals  Care Tool:  Bathing    Body parts bathed by patient:  Left arm, Right arm, Chest, Abdomen, Front perineal area, Left upper leg, Right upper leg, Face, Buttocks, Right lower leg   Body parts bathed by helper: Right lower leg, Buttocks Body parts n/a: Left lower leg   Bathing assist Assist Level: Contact Guard/Touching assist     Upper Body Dressing/Undressing Upper body dressing   What is the patient wearing?: Pull over shirt    Upper body assist Assist Level: Set up assist    Lower Body Dressing/Undressing Lower body dressing      What is the patient wearing?: Underwear/pull up, Pants     Lower body assist Assist for lower body dressing: Minimal Assistance - Patient > 75%     Toileting Toileting    Toileting assist Assist for toileting: Minimal Assistance - Patient > 75%     Transfers Chair/bed transfer  Transfers assist     Chair/bed transfer assist level: Contact Guard/Touching assist     Locomotion Ambulation   Ambulation assist      Assist level: Moderate Assistance - Patient 50 - 74% Assistive device: Walker-rolling Max distance: 36f   Walk 10 feet activity   Assist     Assist level: Moderate Assistance - Patient - 50 - 74% Assistive device: Walker-rolling   Walk 50 feet activity   Assist Walk 50 feet with 2 turns activity did not occur:  Safety/medical concerns         Walk 150 feet activity   Assist Walk 150 feet activity did not occur: Safety/medical concerns         Walk 10 feet on uneven surface  activity   Assist Walk 10 feet on uneven surfaces activity did not occur: Safety/medical concerns         Wheelchair     Assist Is the patient using a wheelchair?: Yes Type of Wheelchair: Manual    Wheelchair assist level: Minimal Assistance - Patient > 75% Max wheelchair distance: 150    Wheelchair 50 feet with 2 turns activity    Assist        Assist Level: Minimal Assistance - Patient > 75%   Wheelchair 150 feet activity     Assist      Assist  Level: Minimal Assistance - Patient > 75%   Blood pressure (!) 133/58, pulse 61, temperature 98.2 F (36.8 C), resp. rate 15, height '5\' 7"'$  (1.702 m), weight 86 kg, SpO2 100 %.    Medical Problem List and Plan: 1. 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  Mod I / supervision with PT and OT             -Cont CIR PT, OT             -Wound vac was removed, surgery considering leaving sutures in place 4 weeks postop depending on wound healing             -NWB LLE  -Team conference today  -Pt unable to afford ramp, she will ask family to take picture of her porch steps to help determine plan 2.  Antithrombotics: -DVT/anticoagulation:  Pharmaceutical: Eliquis resumed on 10/27             -antiplatelet therapy: Plavix 3. Pain Management:  hydrocodone prn.  4. Mood/Behavior/Sleep:  LCSW to follow for evaluation and support.              -antipsychotic agents:  N?A 5. Neuropsych/cognition: This patient is capable of making decisions on her own behalf. 6. Skin/Wound Care: Monitor wound for healing.   --Protein supplements and vitamins added to promote healing.  7. Fluids/Electrolytes/Nutrition: Monitor I/O. Check CMET on 10/30 8.  CAD: Monitor for symptoms with increase in activity. Continue ASA, Plavix, Lipitor. Nitroglycerin PRN. Denies chest pain 9. Hypotension: Monitor BP TID.  Has been stable with midodrine BID.  10 T2DM: Hgb A1C- 5.4 and well controlled.              --Continue Insulin Glargine will adjust to 22 units from 24 units with SSI for elevated BS.              --11/1 Well controlled overall    Latest Ref Rng & Units 01/20/2022    5:47 AM 01/17/2022    7:35 AM 01/16/2022    5:30 AM  CBC  WBC 4.0 - 10.5 K/uL 8.1  7.6  8.5   Hemoglobin 12.0 - 15.0 g/dL 8.3  8.0  7.2   Hematocrit 36.0 - 46.0 % 28.5  26.6  24.7   Platelets 150 - 400 K/uL 671  465  434    .  11. ABLA on anemia of chronic  disease: Stable stable in 7.2->8.0 today.  --Monitor  for signs of bleeding as Eliquis and Plavix resumed 10/27. If Hgb drops may need to hold plavix --h/o Hems (Dr. Collene Mares) -Continue ferrous sulfate -10/30 HGB up to 8.3 12. PAD: Continue Plavix and lipitor 13. Gastroparesis?: Was on reglan PRN PTA.  14. Constipation             -Sorbitol ordered  -10/30 schedule miralax  daily  -11/1 LMB today-improved 15. Hypothyroidism             -Continue synthroid  16. Afib             -Continue Eliquis  17. Thrombocytosis, likely reactive, continue to monitor  18. Chronic urinary frequency  -Order purwick at night, sleeping better with this device 19. K knee pain, likely OA  -11/1 Start voltaren gel  LOS: 6 days A FACE TO FACE EVALUATION WAS PERFORMED  Jennye Boroughs 01/23/2022, 8:16 AM

## 2022-01-23 NOTE — Progress Notes (Signed)
PROGRESS NOTE   Subjective/Complaints: No new complaints or concerns. Reports knee pain on R is improved with voltaren gel. Reports constipation is improved.  ROS- neg CP,cough,  SOB, N/V/D, + constipation-improved, no HA + R knee pain  Objective:   No results found. No results for input(s): "WBC", "HGB", "HCT", "PLT" in the last 72 hours.  No results for input(s): "NA", "K", "CL", "CO2", "GLUCOSE", "BUN", "CREATININE", "CALCIUM" in the last 72 hours.   Intake/Output Summary (Last 24 hours) at 01/23/2022 0939 Last data filed at 01/23/2022 3893 Gross per 24 hour  Intake 377 ml  Output 1100 ml  Net -723 ml         Physical Exam: Vital Signs Blood pressure (!) 133/58, pulse 61, temperature 98.2 F (36.8 C), resp. rate 15, height '5\' 7"'$  (1.702 m), weight 86 kg, SpO2 100 %.   General: No acute distress, seen at bedside Mood and affect are appropriate, pleasant Heart: IRRR, no rubs murmurs or extra sounds Lungs: Clear to auscultation, breathing unlabored, no rales or wheezes, non-labored Abdomen: Positive bowel sounds, soft nontender to palpation, nondistended Extremities: No clubbing, cyanosis, or edema Skin: No evidence of breakdown, no evidence of rash Neurologic: Cranial nerves II through XII intact, motor strength is 5/5 in bilateral deltoid, bicep, tricep, grip, right hip flexor, knee extensors, ankle dorsiflexor and plantar flexor, 4/5 left HF  Musculoskeletal: 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 Functional deficits which require 3+ hours per day of interdisciplinary therapy in a comprehensive inpatient rehab setting. Physiatrist is providing close team supervision and 24 hour management of active medical problems listed below. Physiatrist and rehab team continue to assess barriers to discharge/monitor patient progress toward functional  and medical goals  Care Tool:  Bathing    Body parts bathed by patient: Left arm, Right arm, Chest, Abdomen, Front perineal area, Left upper leg, Right upper leg, Face, Buttocks, Right lower leg   Body parts bathed by helper: Right lower leg, Buttocks Body parts n/a: Left lower leg   Bathing assist Assist Level: Contact Guard/Touching assist     Upper Body Dressing/Undressing Upper body dressing   What is the patient wearing?: Pull over shirt    Upper body assist Assist Level: Set up assist    Lower Body Dressing/Undressing Lower body dressing      What is the patient wearing?: Underwear/pull up, Pants     Lower body assist Assist for lower body dressing: Minimal Assistance - Patient > 75%     Toileting Toileting    Toileting assist Assist for toileting: Minimal Assistance - Patient > 75%     Transfers Chair/bed transfer  Transfers assist     Chair/bed transfer assist level: Contact Guard/Touching assist     Locomotion Ambulation   Ambulation assist      Assist level: Moderate Assistance - Patient 50 - 74% Assistive device: Walker-rolling Max distance: 28f   Walk 10 feet activity   Assist     Assist level: Moderate Assistance - Patient - 50 - 74% Assistive device: Walker-rolling   Walk 50 feet activity   Assist Walk 50  feet with 2 turns activity did not occur: Safety/medical concerns         Walk 150 feet activity   Assist Walk 150 feet activity did not occur: Safety/medical concerns         Walk 10 feet on uneven surface  activity   Assist Walk 10 feet on uneven surfaces activity did not occur: Safety/medical concerns         Wheelchair     Assist Is the patient using a wheelchair?: Yes Type of Wheelchair: Manual    Wheelchair assist level: Minimal Assistance - Patient > 75% Max wheelchair distance: 150    Wheelchair 50 feet with 2 turns activity    Assist        Assist Level: Minimal Assistance -  Patient > 75%   Wheelchair 150 feet activity     Assist      Assist Level: Minimal Assistance - Patient > 75%   Blood pressure (!) 133/58, pulse 61, temperature 98.2 F (36.8 C), resp. rate 15, height '5\' 7"'$  (1.702 m), weight 86 kg, SpO2 100 %.    Medical Problem List and Plan: 1. 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  Mod I / supervision with PT and OT             -Cont CIR PT, OT             -Wound vac was removed, surgery considering leaving sutures in place 4 weeks postop depending on wound healing             -NWB LLE  -Team conference today  -Pt unable to afford ramp, she will ask family to take picture of her porch steps to help determine plan 2.  Antithrombotics: -DVT/anticoagulation:  Pharmaceutical: Eliquis resumed on 10/27             -antiplatelet therapy: Plavix 3. Pain Management:  hydrocodone prn.  4. Mood/Behavior/Sleep:  LCSW to follow for evaluation and support.              -antipsychotic agents:  N?A 5. Neuropsych/cognition: This patient is capable of making decisions on her own behalf. 6. Skin/Wound Care: Monitor wound for healing.   --Protein supplements and vitamins added to promote healing.  7. Fluids/Electrolytes/Nutrition: Monitor I/O. Check CMET on 10/30 8.  CAD: Monitor for symptoms with increase in activity. Continue ASA, Plavix, Lipitor. Nitroglycerin PRN. Denies chest pain 9. Hypotension: Monitor BP TID.  Has been stable with midodrine BID.  10 T2DM: Hgb A1C- 5.4 and well controlled.              --Continue Insulin Glargine will adjust to 22 units from 24 units with SSI for elevated BS.              --11/2 She reports she has a continuous glucose monitor, glucose was a little higher last night, continue to monitor trend    Latest Ref Rng & Units 01/20/2022    5:47 AM 01/17/2022    7:35 AM 01/16/2022    5:30 AM  CBC  WBC 4.0 - 10.5 K/uL 8.1  7.6  8.5    Hemoglobin 12.0 - 15.0 g/dL 8.3  8.0  7.2   Hematocrit 36.0 - 46.0 % 28.5  26.6  24.7   Platelets 150 - 400 K/uL  671  465  434    .  11. ABLA on anemia of chronic disease: Stable stable in 7.2->8.0 today.  --Monitor for signs of bleeding as Eliquis and Plavix resumed 10/27. If Hgb drops may need to hold plavix --h/o Hems (Dr. Collene Mares) -Continue ferrous sulfate -10/30 HGB up to 8.3 12. PAD: Continue Plavix and lipitor 13. Gastroparesis?: Was on reglan PRN PTA.  14. Constipation             -Sorbitol ordered  -10/30 schedule miralax  daily  -11/1 LMB today  -She says she feels better after BM  yesterdasy 15. Hypothyroidism             -Continue synthroid  16. Afib             -Continue Eliquis  17. Thrombocytosis, likely reactive, continue to monitor  18. Chronic urinary frequency  -Order purwick at night, sleeping better with this device 19. K knee pain, likely OA  -11/1 Start voltaren gel  -improved, continue to monitor  LOS: 6 days A FACE TO [place] 01/23/2022, 9:39 AM

## 2022-01-23 NOTE — Progress Notes (Signed)
Physical Therapy Session Note  Patient Details  Name: Tammy Good MRN: [id number] Date of Birth: January 10, 1951  Today's Date: 01/23/2022 PT Individual Time: 5361-4431 PT Individual Time Calculation (min): 70 min   Short Term Goals: Week 1:  PT Short Term Goal 1 (Week 1): Pt will transfer to and from [facility] with CGA PT Short Term Goal 2 (Week 1): Pt will ambulate 78f with min assist and LRAD PT Short Term Goal 3 (Week 1): pt will perform stair management with use of shower chair  Skilled Therapeutic Interventions/Progress Updates:     [facility]. Reports some pain in L lower extremity but not as bad as yesterday. PT provides rest breaks and mobility as needed to manage pain. WC transport to gym for time management. Pt performs stand pivot transfer to mat with RW and minA, with cues for hand placement, sequencing, and positioning. Following rest break, pt ambulates x8' with 180 degree turn, with RW. Pt initially requires minA but with fatigue requires modA for upright posture and safe AD management. Cues provided to increase proximity to RW for safety, and maintain upright posture to improve balance. Following rest breaks, pt performs sit to stand with minA and cues for hand placement. Pt performs 3x10 heel raises with PT performing demonstration for optimal body mechanics and performance. Seated rest break between bout. PT provides cues to reach back with upper extremities to increased eccentric control and safety of stand to sit.   Pt performs sit to stand to RW with mirror positioned for visual feedback. In stranding, pt cued to perform alternating arm raises to challenge standing balance. PT provides CGA and demonstration with cues for optimal performance. 3x10 with seated rest breaks between bout. Exercise progressed by having pt lift bilateral upper extremities at the same time to challenge standing balance and encourage trunk extension. 3x10 with seated rest  breaks.  Pt self propels WC back to room, x150', with bilateral upper extremities and cues for improved propulsion pattern for efficiency and energy conservation. Stand pivot to bed with minA and cues for sequencing. Left supine with alarm intact and all needs within reach.  Therapy Documentation Precautions:  Precautions Precautions: Fall Restrictions Weight Bearing Restrictions: Yes LLE Weight Bearing: Non weight bearing   Therapy/Group: Individual Therapy  WBreck Coons PT, DPT 01/23/2022, 4:56 PM

## 2022-01-23 NOTE — Patient Care Conference (Signed)
Inpatient RehabilitationTeam Conference and Plan of Care Update Date: 01/22/2022   Time: 12:02 PM    Patient Name: Tammy Good      Medical Record Number: [id number]  Date of Birth: 01-30-1951 Sex: Female         Room/Bed: 4W08C/4W08C-01 Payor Info: Payor: MEDICARE / Plan: MEDICARE PART A AND B / Product Type: *No Product type* /    Admit Date/Time:  01/17/2022  3:42 PM  Primary Diagnosis:  Unilateral complete BKA, left, initial encounter [facility])  Hospital Problems: Principal Problem:   Unilateral complete BKA, left, initial encounter [facility])    Expected Discharge Date: Expected Discharge Date: 01/31/22  Team Members Present: Physician leading conference: Dr. Jennye Boroughs Social Worker Present: Ovidio Kin, LCSW Nurse Present: Dorien Chihuahua, RN PT Present: Other (comment) Jodi Mourning Rafoth, PT) OT Present: Laverle Hobby, OT PPS Coordinator present : Gunnar Fusi, SLP     Current Status/Progress Goal Weekly Team Focus  Bowel/Bladder   Patient is continent of bladded/ Bowel  maintain contienence  Assess toileting needs QS/PRN   Swallow/Nutrition/ Hydration             ADL's   (S) UB ADls, CGA LB ADLS, CGA stand pivot transfers. Min A with more dynamic tasks  supervision overall  ADL transfers, ADLs, limb loss education/positioning, and d/c planning   Mobility   CGA/MinA with stand pivot transfers with RW; Supv bed mobility; CGA/MinA for gait up to 6' with RW- Limited by decreased B UE strength and decreased R LE strength  Supv with RW and short-distance gait  Transfers, gait, dynamic stability, residual limb education/pre-prosthetic education   Communication             Safety/Cognition/ Behavioral Observations            Pain   S/P BKA, Pain score 5-6/10, provided PRN Tylenol '650mg'$   <2/10 on pain scale  Assess QS/PRN,threshold obtained   Skin   S/P Left BKA  Wound healing and prevention of infection  Assess sutgical wound QS/PRN, address changes immediately and  consult medical team prn     Discharge Planning:  Home with granddaughrer who is moving in with her but does leave to do her clinicals for OT degree. There may be times she is there by herself. Other family will be checking in with her   Team Discussion: Patient with insomnia and right knee pain. Dressing to incision; no shrinker per [employer].  Patient on target to meet rehab goals: yes, currently needs CGA for lower body care. Min assist for dynamic tasks. CGA - min assist for stand pivots and sit - stand. Able to ambulate 6' however limited by lack of foot clearance and upper extremity weakness. Anticipate will be w/c level at discharge. Goals for discharge set for supervision overall.  *See Care Plan and progress notes for long and short-term goals.   Revisions to Treatment Plan:  N/A   Teaching Needs: Safety, incision care, medications, dietary modifications, transfers, stair management, etc  Current Barriers to Discharge: Home enviroment access/layout, Lack of/limited family support, Weight bearing restrictions, and cannot afford cost for a ramp installation .  Possible Resolutions to Barriers: Family education HH follow up services DME: BSC, TTB, W/C     Medical Summary Current Status: LBKA, R knee pain, constipation, ABLA, DM type 2  Barriers to Discharge: Wound care;Weight bearing restrictions;Home enviroment access/layout;Decreased family/caregiver support  Barriers to Discharge Comments: LBKA, R knee pain, constipation, ABLA, DM type 2 Possible Resolutions to [employer]  Focus: monitor CBG, voltaren gel, monitor labs, continue miralax   Continued Need for Acute Rehabilitation Level of Care: The patient requires daily medical management by a physician with specialized training in physical medicine and rehabilitation for the following reasons: Direction of a multidisciplinary physical rehabilitation program to maximize functional independence : Yes Medical  management of patient stability for increased activity during participation in an intensive rehabilitation regime.: Yes Analysis of laboratory values and/or radiology reports with any subsequent need for medication adjustment and/or medical intervention. : Yes   I attest that I was present, lead the team conference, and concur with the assessment and plan of the team.   Dorien Chihuahua B 01/23/2022, 10:17 AM

## 2022-01-24 LAB — GLUCOSE, CAPILLARY
Glucose-Capillary: 160 mg/dL — ABNORMAL HIGH (ref 70–99)
Glucose-Capillary: 154 mg/dL — ABNORMAL HIGH (ref 70–99)
Glucose-Capillary: 266 mg/dL — ABNORMAL HIGH (ref 70–99)
Glucose-Capillary: 187 mg/dL — ABNORMAL HIGH (ref 70–99)

## 2022-01-24 MED ORDER — INSULIN GLARGINE-YFGN 100 UNIT/ML ~~LOC~~ SOLN
24.0000 [IU] | Freq: Every day | SUBCUTANEOUS | Status: DC
  Administered 2022-01-24 – 2022-01-30 (×7): 24 [IU] via SUBCUTANEOUS
  Filled 2022-01-24 (×8): qty 0.24

## 2022-01-24 NOTE — Progress Notes (Signed)
Physical Therapy Session Note  Patient Details  Name: Tammy Good MRN: [id number] Date of Birth: May 25, 1950  Today's Date: 01/24/2022 PT Individual Time: 1000-1115 PT Individual Time Calculation (min): 75 min   Short Term Goals: Week 1:  PT Short Term Goal 1 (Week 1): Pt will transfer to and from [facility] with CGA PT Short Term Goal 2 (Week 1): Pt will ambulate 1f with min assist and LRAD PT Short Term Goal 3 (Week 1): pt will perform stair management with use of shower chair  Skilled Therapeutic Interventions/Progress Updates:  Patient greeted sitting upright in wheelchair in room and agreeable to PT treatment session. Patient's son returned measurement sheet and patient's smalles door width is 21.5"- OT trialed a 16" wheelchair in order to ensure safety entry through doorways- Patient is a snug fit in a 16" wheelchair, however agreeable to using this size chair at home. Patient propelled manual wheelchair with B UE from room to rehab gym with distant supv.   Patient gait trained x8' with RW and CGA/MinA initially, however when turning to sit on the mat table patient required MaxA for bringing her hips all the way back to the surface secondary to decreased R LE strength and muscular endurance.   Patient stood with RW and took clothes pins off an elevated basketball hoop with CGA for safety- Patient was able to remove x4, x7, x5 with seated rest breaks in between. Patient reporting dizziness when looking up- Discussed potential vertigo and have patient scheduled for a vestibular evaluation this weekend.   Patient transitioned to/from sitting edge of mat table and supine with supv.   Supine therex performed in order to increase B LE strength and ROM for improved functional mobility- L SAQ with 2.3#, 3 x 10 R SAQ with 5#, 3 x 10 L SLR with 2.5#, 3 x 10 R SLR AROM, x10 Glute squeezes, 3 x 10 with 3 sec hold  Patient performed stand pivot transfer from mat table to wc with MinA for  initiating stand and CGA throughout pivot transfer.   Patient returned to her room and requesting to use the restroom- Patient performed stand pivot transfer with RW to/from wc and commode over toilet with CGA/MinA. While standing, patient was able to manage clothing with MinA for stability.    Patient left sitting upright in wheelchair in room with tray table in front, call bell within reach and all needs met.    Therapy Documentation Precautions:  Precautions Precautions: Fall Restrictions Weight Bearing Restrictions: Yes LLE Weight Bearing: Non weight bearing  Therapy/Group: Individual Therapy  Sahithi Ordoyne 01/24/2022, 7:53 AM

## 2022-01-24 NOTE — Progress Notes (Addendum)
PROGRESS NOTE   Subjective/Complaints: No new complaints or concerns this AM.  IV removed yesterday. Voltaren gel helping with R knee pain. She says she does not feel constipated.  ROS- neg CP,cough,  SOB, N/V/D, + constipation-improved, no HA + R knee pain-improved  Objective:   No results found. No results for input(s): "WBC", "HGB", "HCT", "PLT" in the last 72 hours.  No results for input(s): "NA", "K", "CL", "CO2", "GLUCOSE", "BUN", "CREATININE", "CALCIUM" in the last 72 hours.   Intake/Output Summary (Last 24 hours) at 01/24/2022 0755 Last data filed at 01/24/2022 0742 Gross per 24 hour  Intake 1040 ml  Output 1150 ml  Net -110 ml         Physical Exam: Vital Signs Blood pressure (!) 146/66, pulse 67, temperature 98 F (36.7 C), resp. rate 15, height '5\' 7"'$  (1.702 m), weight 84 kg, SpO2 100 %.   General: No acute distress, seen at bedside Mood and affect are appropriate, pleasant Heart: IRRR, no rubs murmurs or extra sounds Lungs: Clear to auscultation, breathing unlabored, no rales or wheezes, non-labored Abdomen: Positive bowel sounds, soft nontender to palpation, nondistended Extremities: No clubbing, cyanosis, or edema Skin: No evidence of breakdown, no evidence of rash Neurologic: Cranial nerves II through XII intact, motor strength is 5/5 in bilateral deltoid, bicep, tricep, grip, right hip flexor, knee extensors, ankle dorsiflexor and plantar flexor, 4+/5 left HF  Musculoskeletal: 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 Functional deficits which require 3+ hours per day of interdisciplinary therapy in a comprehensive inpatient rehab setting. Physiatrist is providing close team supervision and 24 hour management of active medical problems listed below. Physiatrist and rehab team continue to assess barriers to discharge/monitor  patient progress toward functional and medical goals  Care Tool:  Bathing    Body parts bathed by patient: Left arm, Right arm, Chest, Abdomen, Front perineal area, Left upper leg, Right upper leg, Face, Buttocks, Right lower leg   Body parts bathed by helper: Right lower leg, Buttocks Body parts n/a: Left lower leg   Bathing assist Assist Level: Contact Guard/Touching assist     Upper Body Dressing/Undressing Upper body dressing   What is the patient wearing?: Pull over shirt    Upper body assist Assist Level: Independent    Lower Body Dressing/Undressing Lower body dressing      What is the patient wearing?: Underwear/pull up, Pants     Lower body assist Assist for lower body dressing: Minimal Assistance - Patient > 75%     Toileting Toileting    Toileting assist Assist for toileting: Minimal Assistance - Patient > 75%     Transfers Chair/bed transfer  Transfers assist     Chair/bed transfer assist level: Contact Guard/Touching assist     Locomotion Ambulation   Ambulation assist      Assist level: Moderate Assistance - Patient 50 - 74% Assistive device: Walker-rolling Max distance: 22f   Walk 10 feet activity   Assist     Assist level: Moderate Assistance - Patient - 50 - 74% Assistive device: Walker-rolling   Walk 50 feet activity  Assist Walk 50 feet with 2 turns activity did not occur: Safety/medical concerns         Walk 150 feet activity   Assist Walk 150 feet activity did not occur: Safety/medical concerns         Walk 10 feet on uneven surface  activity   Assist Walk 10 feet on uneven surfaces activity did not occur: Safety/medical concerns         Wheelchair     Assist Is the patient using a wheelchair?: Yes Type of Wheelchair: Manual    Wheelchair assist level: Minimal Assistance - Patient > 75% Max wheelchair distance: 150    Wheelchair 50 feet with 2 turns activity    Assist         Assist Level: Minimal Assistance - Patient > 75%   Wheelchair 150 feet activity     Assist      Assist Level: Minimal Assistance - Patient > 75%   Blood pressure (!) 146/66, pulse 67, temperature 98 F (36.7 C), resp. rate 15, height '5\' 7"'$  (1.702 m), weight 84 kg, SpO2 100 %.    Medical Problem List and Plan: 1. Functional deficits secondary to L BKA             -patient may shower if incision kept covered             -ELOS/Goals: 11/10  supervision with PT and OT             -Cont CIR PT, OT             -Wound vac was removed, surgery considering leaving sutures in place 4 weeks postop depending on wound healing             -NWB LLE  -She will ask family to take picture of her porch steps to help determine plan for get up to her home, she is unable to get ramp at this time 2.  Antithrombotics: -DVT/anticoagulation:  Pharmaceutical: Eliquis resumed on 10/27             -antiplatelet therapy: Plavix 3. Pain Management:  hydrocodone prn.  4. Mood/Behavior/Sleep:  LCSW to follow for evaluation and support.              -antipsychotic agents:  N?A 5. Neuropsych/cognition: This patient is capable of making decisions on her own behalf. 6. Skin/Wound Care: Monitor wound for healing.   --Protein supplements and vitamins added to promote healing.  7. Fluids/Electrolytes/Nutrition: Monitor I/O. Check CMET on 10/30 8.  CAD: Monitor for symptoms with increase in activity. Continue ASA, Plavix, Lipitor. Nitroglycerin PRN. Denies chest pain 9. Hypotension: Monitor BP TID.  Has been stable with midodrine BID.  10 T2DM: Hgb A1C- 5.4 and well controlled.              --Continue Insulin Glargine will adjust to 22 units from 24 units with SSI for elevated BS.              --11/3 Will increase glargine back up to 24 units    Latest Ref Rng & Units 01/20/2022    5:47 AM 01/17/2022    7:35 AM 01/16/2022    5:30 AM  CBC  WBC 4.0 - 10.5 K/uL 8.1  7.6  8.5   Hemoglobin 12.0 - 15.0 g/dL 8.3   8.0  7.2   Hematocrit 36.0 - 46.0 % 28.5  26.6  24.7   Platelets 150 - 400 K/uL 671  465  434    .  11. ABLA on anemia of chronic disease: Stable stable in 7.2->8.0 today.  --Monitor for signs of bleeding as Eliquis and Plavix resumed 10/27. If Hgb drops may need to hold plavix --h/o Hems (Dr. Collene Mares) -Continue ferrous sulfate -10/30 HGB up to 8.3 12. PAD: Continue Plavix and lipitor 13. Gastroparesis?: Was on reglan PRN PTA.  14. Constipation             -LBM 11/1, improved, continue miralax daily 15. Hypothyroidism             -Continue synthroid  16. Afib             -Continue Eliquis  17. Thrombocytosis, likely reactive, recheck monday  18. Chronic urinary frequency  -Order purwick at night, sleeping better with this device 19. K knee pain, likely OA  -11/1 Start voltaren gel  -improved with voltaren gel, continue to monitor, consider knee injection next week if pain worsens again  LOS: 7 days A FACE TO FACE EVALUATION WAS PERFORMED  Jennye Boroughs 01/24/2022, 7:55 AM

## 2022-01-24 NOTE — Progress Notes (Signed)
Occupational Therapy Session Note  Patient Details  Name: Tammy Good MRN: [id number] Date of Birth: 1951-01-11  Session 1  Today's Date: 01/24/2022 OT Individual Time: 3419-6222 OT Individual Time Calculation (min): 75 min     Session 2 Today's Date: 01/24/2022 OT Individual Time: 9798-9211 OT Individual Time Calculation (min): 45 min    Short Term Goals: Week 1:  OT Short Term Goal 1 (Week 1): LTG=STG 2/2 ELOS  Skilled Therapeutic Interventions/Progress Updates:    Session 1 Pt received supine with no c/o pain, agreeable to OT session. Ace wrap poorly wrapped and hanging off residual limb, providing no compression. Extensive education on importance of tight compression via wrap to ensure proper edema management and shaping for hopeful future prosthetic. OT wrapped with double ace wrap to ensure both adequate compression and coverage of residual limb over the knee to promote knee extension at rest. Pt was able to tolerate desensitization rubbing/tapping on distal end of limb with no c/o pain. Incision was dry with minimal drainage and minimal, consistent mild erythema. Pt came to EOB with mod I using bed rail. Pt had difficulty coming to stand from EOB d/t R knee weakness/pain and audible crepitus. Bed slightly elevated and with CGA pt able to stand with the RW. Pt completed a stand pivot with CGA to the w/c. She completed UB ADLs seated with (S). CGA for LB bathing and dressing sit <> stand. Pt's family returned home measurement sheet and discussed accessibility. Trialed pt in a 16x18 w/c and it was a snug fit. Discussed need to likely sit elsewhere for longer periods of time but this size would allow her to access her home. Pt propelled the w/c 150 ft before fatiguing. Cueing for technique. She was given demo re figure 8 wrapping and she was able to return te demo on a model. She was given the model to take back to her room to continue practicing. Pt was left sitting up in the w/c with  all needs met,  and call bell within reach.      Session 2 Pt received in w/c with 3/10 pain in her residual limb, no request for intervention. She completed a stand pivot transfer to the [facility] with CGA using the RW. She was able to complete all toileting tasks with CGA sit <> stand. Min cueing for sitting to retrieve pants from the floor instead of reaching in standing. She returned to the w/c. 100 ft of w/c propulsion with (S). Pt c/o thenar eminence hurting during propulsion on her L hand. Upon observation she is scraping it against the wheel while propelling, provided education on technique. Recommended she get w/c gloves to protect skin integrity as well. Provided demonstration of figure 8 wrapping and pt was able to return demo with min A. Pt completed stand pivot transfer to the mat with close (S). She transitioned into prone on the mat. Edu provided on benefits of prone positioning on hip and knee extension. She completed 3x10 hip extension with cueing for glute activation. Completed to improve residual limb positioning and glute strengthening. She then completed 2x 10 prone push ups- completed to improve BUE strength needed for ADL transfers. Pt returned to her room and was left supine with all needs met.     Therapy Documentation Precautions:  Precautions Precautions: Fall Restrictions Weight Bearing Restrictions: Yes LLE Weight Bearing: Non weight bearing  Therapy/Group: Individual Therapy  Curtis Sites 01/24/2022, 6:26 AM

## 2022-01-25 DIAGNOSIS — I739 Peripheral vascular disease, unspecified: Secondary | ICD-10-CM

## 2022-01-25 DIAGNOSIS — E861 Hypovolemia: Secondary | ICD-10-CM

## 2022-01-25 DIAGNOSIS — E119 Type 2 diabetes mellitus without complications: Secondary | ICD-10-CM

## 2022-01-25 DIAGNOSIS — I9589 Other hypotension: Secondary | ICD-10-CM

## 2022-01-25 LAB — GLUCOSE, CAPILLARY
Glucose-Capillary: 125 mg/dL — ABNORMAL HIGH (ref 70–99)
Glucose-Capillary: 158 mg/dL — ABNORMAL HIGH (ref 70–99)
Glucose-Capillary: 149 mg/dL — ABNORMAL HIGH (ref 70–99)

## 2022-01-25 NOTE — Progress Notes (Signed)
Physical Therapy Weekly Progress Note  Patient Details  Name: Tammy Good MRN: [id number] Date of Birth: [birth year]  Beginning of progress report period: January 18, 2022 End of progress report period: January 25, 2022  Today's Date: 01/25/2022 PT Individual Time: 0800-0900 PT Individual Time Calculation (min): 60 min   Patient has met 1 of 3 short term goals. Patient has made progress toward her long term goals since initial evaluation, however is limited secondary to global weakness, impaired endurance/activity tolerance and impaired dynamic stability. Patient's family will install a ramp for safe entrance into her home at discharge- Stair mobility goals to be discharged at this time.   Patient continues to demonstrate the following deficits muscle weakness, decreased cardiorespiratoy endurance, and decreased standing balance, decreased balance strategies, and new L BKA  and therefore will continue to benefit from skilled PT intervention to increase functional independence with mobility.  Patient progressing toward long term goals..  Continue plan of care.  PT Short Term Goals Week 1:  PT Short Term Goal 1 (Week 1): Pt will transfer to and from [facility] with CGA PT Short Term Goal 1 - Progress (Week 1): Met PT Short Term Goal 2 (Week 1): Pt will ambulate 5f with min assist and LRAD PT Short Term Goal 2 - Progress (Week 1): Progressing toward goal PT Short Term Goal 3 (Week 1): pt will perform stair management with use of shower chair PT Short Term Goal 3 - Progress (Week 1): Discontinued (comment) (Family is to install a ramp- Stair mobility goals to be discontinued.) Week 2:  PT Short Term Goal 1 (Week 2): STG=LTG secondary to ELOS  Skilled Therapeutic Interventions/Progress Updates:  Patient greeted supine in bed and agreeable to PT treatment session. Patient was able to doff pajamas and don clean clothes with set-up assist while sitting EOB with no LOB noted. Patient performed  sit/stand with RW and MinA from low bed height- While standing, patient pulled up briefs and pants with MinA. Patient then performed stand pivot from EOB to wheelchair with RW and CGA- VC for reaching back for the wheelchair with one hand prior to sitting for improved safety and stability. Patient then wheeled herself to the sink where she was able to brush her teeth, wash her face and comb her hair independently. Patient reports she re-wrapped her ACE bandage this morning- Good wrapping noted with minor VC for increased tension at the base of residual limb. Therapist applied voltaren gel to R knee- RN notified and aware.   Patient propelled manual wheelchair with B UE to the rehab gym from her room with distant supv and increased time.   Patient gait trained x10' with RW and CGA/MinA- Patient demonstrated improved R foot clearance, however unable to sustain secondary to decreased muscular strength and endurance.   Patient performed x5 sit/stands from edge of mat table with RW and CGA for safety- Notable crepitus in R LE, however no reports of pain.   Patient stood with RW and CGA for safety while reaching with her R UE outside her BOS for bean bags and tossing them at the corn hole board- Performed x16 times with a seated rest break after x9 secondary to fatigue.   Same activity performed as above, however patient reached across her body with her R UE to grab the bean bags and then toss them at the corn hole board x16 times- Patient was able to remain standing for all 16 bean bags with CGA for safety.   Patient performed  stand pivot transfer from EOM to wheelchair with RW and CGA.    Patient left sitting upright in wheelchair in room with call bell within reach and all needs met.    Therapy Documentation Precautions:  Precautions Precautions: Fall Restrictions Weight Bearing Restrictions: Yes LLE Weight Bearing: Non weight bearing  Therapy/Group: Individual Therapy  Tammy Good 01/25/2022, 7:52 AM

## 2022-01-25 NOTE — Progress Notes (Signed)
Occupational Therapy Session Note  Patient Details  Name: MAKESHIA SEAT MRN: [id number] Date of Birth: Sep 17, 1950  Today's Date: 01/25/2022 OT Individual Time: 4174-0814 OT Individual Time Calculation (min): 43 min    Short Term Goals: Week 1:  OT Short Term Goal 1 (Week 1): LTG=STG 2/2 ELOS  Skilled Therapeutic Interventions/Progress Updates:    Patient agreeable to participate in OT session. Reports 0/10 pain level.   Patient participated in skilled OT session focusing on standing balance and BUE strengthening. Therapist integrated standing balance/endurance and UB strengthening while pt participated in Wii bowling; alternating between standing with RW and sitting while using 1.5lb wrist weight on RUE in order to increase overall standing balance and endurance when participating in self care tasks. Pt completed all functional transfers during session with RW with Min A including bed to w/c and w/c to [facility] at end of session. Pt participated in 1 bowling game of 10 frames while tolerating standing on RLE ~3-4 times using RW and Min A. Standing tolerance limited due to right knee discomfort reported. At end of session, pt completed BUE strengthening utilizing 4lb weighted dowel in order to increase ability to complete sit to stands with less difficulty.  Strengthening exercises: - Shoulder, seated, chest press, shoulder flexion (to 90) 10X, 1 set, 4lb weighted dowel.    Therapy Documentation Precautions:  Precautions Precautions: Fall Restrictions Weight Bearing Restrictions: Yes LLE Weight Bearing: Non weight bearing  Therapy/Group: Individual Therapy  Ailene Ravel, OTR/L,CBIS  Supplemental OT - [facility] and [facility]  01/25/2022, 7:59 AM

## 2022-01-25 NOTE — Progress Notes (Signed)
PROGRESS NOTE   Subjective/Complaints: Overall feeling well and happy with progress.   ROS: Patient denies fever, rash, sore throat, blurred vision, dizziness, nausea, vomiting, diarrhea, cough, shortness of breath or chest pain,  headache, or mood change.    Objective:   No results found. No results for input(s): "WBC", "HGB", "HCT", "PLT" in the last 72 hours.  No results for input(s): "NA", "K", "CL", "CO2", "GLUCOSE", "BUN", "CREATININE", "CALCIUM" in the last 72 hours.   Intake/Output Summary (Last 24 hours) at 01/25/2022 1137 Last data filed at 01/25/2022 0645 Gross per 24 hour  Intake 360 ml  Output 900 ml  Net -540 ml        Physical Exam: Vital Signs Blood pressure (!) 138/42, pulse 60, temperature 97.8 F (36.6 C), temperature source Oral, resp. rate 18, height '5\' 7"'$  (1.702 m), weight 82.8 kg, SpO2 100 %.   Constitutional: No distress . Vital signs reviewed. HEENT: NCAT, EOMI, oral membranes moist Neck: supple Cardiovascular: RRR without murmur. No JVD    Respiratory/Chest: CTA Bilaterally without wheezes or rales. Normal effort    GI/Abdomen: BS +, non-tender, non-distended Ext: no clubbing, cyanosis, or edema Psych: pleasant and cooperative  Musc: left BK with decreased edema, sl bulbous at distal end Skin: No evidence of breakdown, no evidence of rash Neurologic: Cranial nerves II through XII intact, motor strength is 5/5 in bilateral deltoid, bicep, tricep, grip, right hip flexor, knee extensors, ankle dorsiflexor and plantar flexor, 4+/5 left HF      Assessment/Plan: 1. Functional deficits which require 3+ hours per day of interdisciplinary therapy in a comprehensive inpatient rehab setting. Physiatrist is providing close team supervision and 24 hour management of active medical problems listed below. Physiatrist and rehab team continue to assess barriers to discharge/monitor patient progress  toward functional and medical goals  Care Tool:  Bathing    Body parts bathed by patient: Left arm, Right arm, Chest, Abdomen, Front perineal area, Left upper leg, Right upper leg, Face, Buttocks, Right lower leg   Body parts bathed by helper: Right lower leg, Buttocks Body parts n/a: Left lower leg   Bathing assist Assist Level: Contact Guard/Touching assist     Upper Body Dressing/Undressing Upper body dressing   What is the patient wearing?: Pull over shirt    Upper body assist Assist Level: Independent    Lower Body Dressing/Undressing Lower body dressing      What is the patient wearing?: Underwear/pull up, Pants     Lower body assist Assist for lower body dressing: Minimal Assistance - Patient > 75%     Toileting Toileting    Toileting assist Assist for toileting: Minimal Assistance - Patient > 75%     Transfers Chair/bed transfer  Transfers assist     Chair/bed transfer assist level: Contact Guard/Touching assist     Locomotion Ambulation   Ambulation assist      Assist level: Moderate Assistance - Patient 50 - 74% Assistive device: Walker-rolling Max distance: 73f   Walk 10 feet activity   Assist     Assist level: Moderate Assistance - Patient - 50 - 74% Assistive device: Walker-rolling   Walk 50 feet activity  Assist Walk 50 feet with 2 turns activity did not occur: Safety/medical concerns         Walk 150 feet activity   Assist Walk 150 feet activity did not occur: Safety/medical concerns         Walk 10 feet on uneven surface  activity   Assist Walk 10 feet on uneven surfaces activity did not occur: Safety/medical concerns         Wheelchair     Assist Is the patient using a wheelchair?: Yes Type of Wheelchair: Manual    Wheelchair assist level: Minimal Assistance - Patient > 75% Max wheelchair distance: 150    Wheelchair 50 feet with 2 turns activity    Assist        Assist Level:  Minimal Assistance - Patient > 75%   Wheelchair 150 feet activity     Assist      Assist Level: Minimal Assistance - Patient > 75%   Blood pressure (!) 138/42, pulse 60, temperature 97.8 F (36.6 C), temperature source Oral, resp. rate 18, height '5\' 7"'$  (1.702 m), weight 82.8 kg, SpO2 100 %.    Medical Problem List and Plan: 1. 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 Will consult [employer] to provide her more information 2.  Antithrombotics: -DVT/anticoagulation:  Pharmaceutical: Eliquis resumed on 10/27             -antiplatelet therapy: Plavix 3. Pain Management:  hydrocodone prn.  4. Mood/Behavior/Sleep:  LCSW to follow for evaluation and support.              -antipsychotic agents:  N?A 5. Neuropsych/cognition: This patient is capable of making decisions on her own behalf. 6. Skin/Wound Care: Monitor wound for healing.   --Protein supplements and vitamins added to promote healing.  7. Fluids/Electrolytes/Nutrition: Monitor I/O. Check CMET on 10/30 8.  CAD: Monitor for symptoms with increase in activity. Continue ASA, Plavix, Lipitor. Nitroglycerin PRN. Denies chest pain 9. Hypotension: Monitor BP TID.  Has been stable with midodrine BID.  10 T2DM: Hgb A1C- 5.4 and well controlled.              --Continue Insulin Glargine will adjust to 22 units from 24 units with SSI for elevated BS.              --11/3 Will increase glargine back up to 24 units CBG (last 3)  Recent Labs     01/24/22 1858 01/24/22 2034 01/25/22 0629  GLUCAP 266* 187* 125*    11/4-monitor for pattern today      Latest Ref Rng & Units 01/20/2022    5:47 AM 01/17/2022    7:35 AM 01/16/2022    5:30 AM  CBC  WBC 4.0 - 10.5 K/uL 8.1  7.6  8.5   Hemoglobin 12.0 - 15.0 g/dL 8.3  8.0  7.2   Hematocrit 36.0 - 46.0 % 28.5  26.6  24.7   Platelets 150 - 400 K/uL 671  465  434    .  11. ABLA on anemia of chronic disease: Stable stable in 7.2->8.0 today.  --Monitor for signs of bleeding as Eliquis and Plavix resumed 10/27. If Hgb drops may need to hold plavix --h/o Hems (Dr. Collene Mares) -Continue ferrous sulfate -10/30 HGB up to 8.3 12. PAD: Continue Plavix and lipitor 13. Gastroparesis?: Was on reglan PRN PTA.  14. Constipation             -LBM 11/1, improved, continue miralax daily 15. Hypothyroidism             -Continue synthroid  16. Afib             -Continue Eliquis  17. Thrombocytosis, likely reactive, recheck monday  18. Chronic urinary frequency  -Order purwick at night, sleeping better with this device 19. K knee pain, likely OA  -11/1 Start voltaren gel  -improved with voltaren gel, continue to monitor, consider knee injection next week if pain worsens again  LOS: 8 days A FACE TO FACE EVALUATION WAS PERFORMED  Meredith Staggers 01/25/2022, 11:37 AM

## 2022-01-26 LAB — GLUCOSE, CAPILLARY
Glucose-Capillary: 196 mg/dL — ABNORMAL HIGH (ref 70–99)
Glucose-Capillary: 140 mg/dL — ABNORMAL HIGH (ref 70–99)
Glucose-Capillary: 275 mg/dL — ABNORMAL HIGH (ref 70–99)
Glucose-Capillary: 219 mg/dL — ABNORMAL HIGH (ref 70–99)

## 2022-01-27 LAB — GLUCOSE, CAPILLARY
Glucose-Capillary: 171 mg/dL — ABNORMAL HIGH (ref 70–99)
Glucose-Capillary: 95 mg/dL (ref 70–99)
Glucose-Capillary: 209 mg/dL — ABNORMAL HIGH (ref 70–99)
Glucose-Capillary: 110 mg/dL — ABNORMAL HIGH (ref 70–99)
Glucose-Capillary: 184 mg/dL — ABNORMAL HIGH (ref 70–99)
Glucose-Capillary: 238 mg/dL — ABNORMAL HIGH (ref 70–99)

## 2022-01-27 LAB — CBC
HCT: 28.9 % — ABNORMAL LOW (ref 36.0–46.0)
Hemoglobin: 8.5 g/dL — ABNORMAL LOW (ref 12.0–15.0)
MCH: 27.2 pg (ref 26.0–34.0)
MCHC: 29.4 g/dL — ABNORMAL LOW (ref 30.0–36.0)
MCV: 92.3 fL (ref 80.0–100.0)
Platelets: 575 10*3/uL — ABNORMAL HIGH (ref 150–400)
RBC: 3.13 MIL/uL — ABNORMAL LOW (ref 3.87–5.11)
RDW: 19.9 % — ABNORMAL HIGH (ref 11.5–15.5)
WBC: 10.5 10*3/uL (ref 4.0–10.5)
nRBC: 0 % (ref 0.0–0.2)

## 2022-01-27 LAB — BASIC METABOLIC PANEL
Anion gap: 5 (ref 5–15)
BUN: 23 mg/dL (ref 8–23)
CO2: 24 mmol/L (ref 22–32)
Calcium: 8.4 mg/dL — ABNORMAL LOW (ref 8.9–10.3)
Chloride: 109 mmol/L (ref 98–111)
Creatinine, Ser: 0.62 mg/dL (ref 0.44–1.00)
GFR, Estimated: >60 mL/min (ref >60)
Glucose, Bld: 90 mg/dL (ref 70–99)
Potassium: 3.6 mmol/L (ref 3.5–5.1)
Sodium: 138 mmol/L (ref 135–145)

## 2022-01-27 MED ORDER — POLYETHYLENE GLYCOL 3350 17 G PO PACK: 17.0000 g | PACK | Freq: Every day | ORAL | Status: DC | PRN

## 2022-01-27 NOTE — Progress Notes (Signed)
Occupational Therapy Weekly Progress Note  Patient Details  Name: Tammy Good MRN: [id number] Date of Birth: 23-Jan-1951  Beginning of progress report period: January 19, 2022 End of progress report period: January 27, 2022  Today's Date: 01/27/2022 OT Individual Time: 4098-1191 OT Individual Time Calculation (min): 74 min   Nayla has made great progress in CIR despite limitations from chronic R knee pain. She is able to complete her ADL transfers at a CGA level and is quickly approaching (S). She occasionally requires min A from lower surfaces due to aforementioned knee pain. She is able to complete UB ADLs with set up assist and LB with CGA. Equipment has been ordered and family education will be scheduled at the end of the week.   Patient continues to demonstrate the following deficits: muscle weakness, decreased cardiorespiratoy endurance, and decreased standing balance, decreased postural control, and decreased balance strategies and therefore will continue to benefit from skilled OT intervention to enhance overall performance with BADL and iADL.  Patient progressing toward long term goals..  Plan of care revisions: Toileting goals upgraded d/t intermittent supervision pt will have at home.  OT Short Term Goals Week 1:  OT Short Term Goal 1 (Week 1): LTG=STG 2/2 ELOS Week 2:  OT Short Term Goal 1 (Week 2): Continue progressing toward LTGs  Skilled Therapeutic Interventions/Progress Updates:    Pt received supine with no c/o pain, agreeable to OT session. Pt asking OT if there is anyway to modify schedule today d/t out of state family visiting. Consulted with PT and reviewed the schedule but likely no way unless pt misses part of PT session. Discussed 3 hrs/day and makeup time if pt chooses to do this. Pt also with questions re discharge and extension to work on hopping/mobility more. Discussed that team will evaluate Wednesday in conference but likely d/t (S) level transfers Saturday  is appropriate. She came to EOB with mod I. Close (S) for sit > stand and pivot transfer to the w/c. Pt taken into bathroom. (S) stand pivot transfer into the shower. She doffed all clothing with (S). Bathing with (S) seated with lateral leans for peri hygiene. She required CGA for transfer out of shower and back to the w/c. She dressed with CGA overall. She was able to don shoes as well with (S), reporting she has enjoyed the elastic shoe laces. She was able to re-wrap her residual limb via figure 8 technique with min cueing. She was taken via w/c to the ADL apt to practice furniture transfers. Discussed home accessibility and provided education throughout. She completed a bed transfer without bed rails with close CGA d/t slippery bed cover. Pt able to complete stand pivot with CGA on carpet to simulate home environment. She then completed a transfer to the recliner. CGA to get there but then pt had a lot of trouble getting up d/t chair rocking and being low. Heavy mod A to stand. Pt taken back to her room and was left sitting up with all needs met.   Therapy Documentation Precautions:  Precautions Precautions: Fall Restrictions Weight Bearing Restrictions: No LLE Weight Bearing: Non weight bearing  Therapy/Group: Individual Therapy  Curtis Sites 01/27/2022, 6:46 AM

## 2022-01-27 NOTE — Progress Notes (Signed)
+/-   sleep. Using purewick at HS, otherwise incontinent of urine. Foam dressing applied to sacrum, no breakdown, skin macerated. Dressing to BKA changed, erythema noted around incision, no drainage. Patrici Ranks A

## 2022-01-27 NOTE — Progress Notes (Signed)
Occupational Therapy Session Note  Patient Details  Name: Tammy Good MRN: [id number] Date of Birth: 12-21-1950  Today's Date: 01/27/2022 OT Individual Time: 1301-1346 OT Individual Time Calculation (min): 45 min    Short Term Goals: Week 2:  OT Short Term Goal 1 (Week 2): Continue progressing toward LTGs  Skilled Therapeutic Interventions/Progress Updates:    Pt received sitting up with no c/o pain. She reports areas on her inner L thigh was bleeding again. Upon inspection she was bleeding through a band-aid. Foam bandage applied. Pt propelled w/c to the therapy gym with very slow pace, 100 ft. Discussed use of squat pivots on days where her knee is bothering her. Pt practiced 4x squat pivot with min A, poor hip clearance and pt attempting to scoot instead of lifting hips. She then transitioned into supine on the mat and completed 3x10 sets of sidelying hip extension and glute bridges. Both performed to increase hip and glute strength needed to complete ADL transfers. She then transitioned into prone for benefit of hip and knee extension positioning for prosthetic prep. She came back to [facility] and completed sit <> stand with CGA with the RW. She completed 3x 2 hops forward and back with CGA- completed to challenge ADL transfers. She returned to her room and was left sitting up with all needs met.   Therapy Documentation Precautions:  Precautions Precautions: Fall Restrictions Weight Bearing Restrictions: No LLE Weight Bearing: Non weight bearing Therapy/Group: Individual Therapy  Curtis Sites 01/27/2022, 8:59 AM

## 2022-01-27 NOTE — Progress Notes (Signed)
Orthopedic Tech Progress Note Patient Details:  Tammy Good [birth year] [id number]  Called in order to [employer] for a PRE PROSTHETIC EDUCATION BKA   Patient ID: Tammy Good, female   DOB: 11-Oct-1950, 71 y.o.   MRN: [id number]  Tammy Good 01/27/2022, 8:20 AM

## 2022-01-27 NOTE — Plan of Care (Signed)
Toileting goals upgraded to mod I to reflect intermittent supervision pt will have during the day.   Problem: RH Toileting Goal: LTG Patient will perform toileting task (3/3 steps) with assistance level (OT) Description: LTG: Patient will perform toileting task (3/3 steps) with assistance level (OT)  Flowsheets (Taken 01/27/2022 0758) LTG: Pt will perform toileting task (3/3 steps) with assistance level: (upgraded 11/6- SD) Independent with assistive device   Problem: RH Toilet Transfers Goal: LTG Patient will perform toilet transfers w/assist (OT) Description: LTG: Patient will perform toilet transfers with assist, with/without cues using equipment (OT) Flowsheets (Taken 01/27/2022 0758) LTG: Pt will perform toilet transfers with assistance level of: (upgraded 11/6- SD) Independent with assistive device

## 2022-01-27 NOTE — Progress Notes (Signed)
PROGRESS NOTE   Subjective/Complaints: Reports she feels well overall.  Reports she had BM yesterday. Pain under control.   Review of Systems  Constitutional:  Negative for chills and fever.  HENT:  Negative for congestion and hearing loss.   Eyes:  Negative for double vision.  Respiratory:  Negative for cough.   Cardiovascular:  Negative for chest pain.  Gastrointestinal:  Positive for constipation. Negative for abdominal pain, diarrhea, heartburn and vomiting.  Genitourinary: Negative.   Neurological:  Negative for headaches.      Objective:   No results found. Recent Labs    01/27/22 0548  WBC 10.5  HGB 8.5*  HCT 28.9*  PLT 575*    No results for input(s): "NA", "K", "CL", "CO2", "GLUCOSE", "BUN", "CREATININE", "CALCIUM" in the last 72 hours.   Intake/Output Summary (Last 24 hours) at 01/27/2022 0821 Last data filed at 01/27/2022 0536 Gross per 24 hour  Intake 716 ml  Output 800 ml  Net -84 ml         Physical Exam: Vital Signs Blood pressure 136/60, pulse 62, temperature 97.7 F (36.5 C), temperature source Oral, resp. rate 17, height '5\' 7"'$  (1.702 m), weight 86.5 kg, SpO2 100 %.   Constitutional: No distress . Vital signs reviewed. HEENT: NCAT, EOMI, oral membranes moist Neck: supple Cardiovascular: RRR without murmur. No JVD    Respiratory/Chest: CTA Bilaterally without wheezes or rales. Normal effort    GI/Abdomen: BS +, non-tender, non-distended Ext: no clubbing, cyanosis, or edema Psych: pleasant and cooperative  Musc: left BK with decreased edema, sl bulbous at distal end Skin: No evidence of breakdown, no evidence of rash Neurologic: Cranial nerves II through XII intact, motor strength is 5/5 in bilateral deltoid, bicep, tricep, grip, right hip flexor, knee extensors, ankle dorsiflexor and plantar flexor, 4+/5 left HF      Assessment/Plan: 1. Functional deficits which require 3+ hours  per day of interdisciplinary therapy in a comprehensive inpatient rehab setting. Physiatrist is providing close team supervision and 24 hour management of active medical problems listed below. Physiatrist and rehab team continue to assess barriers to discharge/monitor patient progress toward functional and medical goals  Care Tool:  Bathing    Body parts bathed by patient: Left arm, Right arm, Chest, Abdomen, Front perineal area, Left upper leg, Right upper leg, Face, Buttocks, Right lower leg   Body parts bathed by helper: Right lower leg, Buttocks Body parts n/a: Left lower leg   Bathing assist Assist Level: Contact Guard/Touching assist     Upper Body Dressing/Undressing Upper body dressing   What is the patient wearing?: Pull over shirt    Upper body assist Assist Level: Independent    Lower Body Dressing/Undressing Lower body dressing      What is the patient wearing?: Underwear/pull up, Pants     Lower body assist Assist for lower body dressing: Minimal Assistance - Patient > 75%     Toileting Toileting    Toileting assist Assist for toileting: Minimal Assistance - Patient > 75%     Transfers Chair/bed transfer  Transfers assist     Chair/bed transfer assist level: Contact Guard/Touching assist     Locomotion Ambulation  Ambulation assist      Assist level: Moderate Assistance - Patient 50 - 74% Assistive device: Walker-rolling Max distance: 77f   Walk 10 feet activity   Assist     Assist level: Moderate Assistance - Patient - 50 - 74% Assistive device: Walker-rolling   Walk 50 feet activity   Assist Walk 50 feet with 2 turns activity did not occur: Safety/medical concerns         Walk 150 feet activity   Assist Walk 150 feet activity did not occur: Safety/medical concerns         Walk 10 feet on uneven surface  activity   Assist Walk 10 feet on uneven surfaces activity did not occur: Safety/medical concerns          Wheelchair     Assist Is the patient using a wheelchair?: Yes Type of Wheelchair: Manual    Wheelchair assist level: Minimal Assistance - Patient > 75% Max wheelchair distance: 150    Wheelchair 50 feet with 2 turns activity    Assist        Assist Level: Minimal Assistance - Patient > 75%   Wheelchair 150 feet activity     Assist      Assist Level: Minimal Assistance - Patient > 75%   Blood pressure 136/60, pulse 62, temperature 97.7 F (36.5 C), temperature source Oral, resp. rate 17, height '5\' 7"'$  (1.702 m), weight 86.5 kg, SpO2 100 %.    Medical Problem List and Plan: 1. 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 Will consult [employer] to provide her more information 2.  Antithrombotics: -DVT/anticoagulation:  Pharmaceutical: Eliquis resumed on 10/27             -antiplatelet therapy: Plavix 3. Pain Management:  hydrocodone prn.  4. Mood/Behavior/Sleep:  LCSW to follow for evaluation and support.              -antipsychotic agents:  N?A 5. Neuropsych/cognition: This patient is capable of making decisions on her own behalf. 6. Skin/Wound Care: Monitor wound for healing.   --Protein supplements and vitamins added to promote healing.  7. Fluids/Electrolytes/Nutrition: Monitor I/O. Check CMET on 10/30 8.  CAD: Monitor for symptoms with increase in activity. Continue ASA, Plavix, Lipitor. Nitroglycerin PRN. Denies chest pain 9. Hypotension: Monitor BP TID.  Has been  stable with midodrine BID.  10 T2DM: Hgb A1C- 5.4 and well controlled.              --Continue Insulin Glargine will adjust to 22 units from 24 units with SSI for elevated BS.              --11/3 Will increase glargine back up to 24 units CBG (last  3)  Recent Labs    01/26/22 2100 01/27/22 0556 01/27/22 0752  GLUCAP 219* 95 209*     11/4-monitor for pattern today      Latest Ref Rng & Units 01/27/2022    5:48 AM 01/20/2022    5:47 AM 01/17/2022    7:35 AM  CBC  WBC 4.0 - 10.5 K/uL 10.5  8.1  7.6   Hemoglobin 12.0 - 15.0 g/dL 8.5  8.3  8.0   Hematocrit 36.0 - 46.0 % 28.9  28.5  26.6   Platelets 150 - 400 K/uL 575  671  465    .  11. ABLA on anemia of chronic disease: Stable stable in 7.2->8.0 today.  --Monitor for signs of bleeding as Eliquis and Plavix resumed 10/27. If Hgb drops may need to hold plavix --h/o [facility] (Dr. Collene Mares) -Continue ferrous sulfate -11/6 WBC 8.5 12. PAD: Continue Plavix and lipitor 13. Gastroparesis?: Was on reglan PRN PTA.  14. Constipation             -LBM 11/1, improved, continue miralax daily  -LBM 11/6 will add PRN  15. Hypothyroidism             -Continue synthroid  16. Afib             -Continue Eliquis   -HR controlled  17. Thrombocytosis, likely reactive, recheck monday  18. Chronic urinary frequency  -Order purwick at night, sleeping better with this device 19. K knee pain, likely OA  -11/1 Start voltaren gel  -improved with voltaren gel, continue to monitor, consider knee injection next week if pain worsens again 20. Thrombocytosis   -likely reactive, improved today  LOS: 10 days A FACE TO FACE EVALUATION WAS PERFORMED  Jennye Boroughs 01/27/2022, 8:21 AM

## 2022-01-27 NOTE — Progress Notes (Signed)
Physical Therapy Session Note  Patient Details  Name: Tammy Good MRN: [id number] Date of Birth: [birth year]  Today's Date: 01/27/2022 PT Individual Time: 0945-1058 PT Individual Time Calculation (min): 73 min   Short Term Goals: Week 2:  PT Short Term Goal 1 (Week 2): STG=LTG secondary to ELOS  Skilled Therapeutic Interventions/Progress Updates:      Therapy Documentation Precautions:  Precautions Precautions: Fall Restrictions Weight Bearing Restrictions: No LLE Weight Bearing: Non weight bearing   Pt received seated in w/c at bedside and agreeable to vestibular assessment as pt reports dizziness with looking up and reaching in bilateral directions. Onset of dizziness occurred approximately 1 year ago and is aggravated with positional head changes in standing only. Pt declines head/neck trauma and cervical range of motion within normal limits. Pt negative for VBI, central vestibular signs and BPPV via Marye Round test. Pt PMH significant for right tympanomastoidectomy in October of 2019 and pituitary gland tumoral resection in 1995.   Vestibular Findings: Vestibular hypofunction   Vestibular Intervention Recommendations: habituation exercises to tolerance of dizziness <5/10 of aggravating position  Mobility throughout vestibular assessment: Pt requires close supervision for safety with sit to stand with no AD and able to tolerate position ~2 minutes before needing assistive device for balance. Pt close supervision for safety with stand pivot transfer from w/c to bed with no AD and with bed mobility. Pt left semi-reclined in bed with all needs in reach and bed alarm on.      Vestibular Assessment - 01/27/22 0001     Symptom Behavior           Subjective history of current problem pt reports lightheadedness, tremors and "floating" feeling when looking up and reaching in bilateral directions    Type of Dizziness  Unsteady with head/body turns;"Funny feeling in head";Comment   lightheadednesss, tremors   Frequency of Dizziness --  intermittently with positional change of looking up and reaching   Duration of Dizziness 30 seconds    Symptom Nature Motion provoked    Aggravating Factors Looking up to the ceiling    Relieving Factors Comments  sit down and look down   Progression of Symptoms No change since onset    History of similar episodes onset 1 year ago          Oculomotor Exam           Oculomotor Alignment Normal    Ocular ROM WNL    Spontaneous Absent    Gaze-induced  Absent    Smooth Pursuits Intact    Saccades Intact          Oculomotor Exam-Fixation Suppressed            Ocular Alignment WNL    Ocular ROM WNL    Spontaneous Nystagmus Absent    Gaze evoked nystagmus Absent    Left Head Impulse Negative    Right Head Impulse Negative    Head Shaking Nystagmus-Horizontal Negative          Positional Testing           Dix-Hallpike Dix-Hallpike Right;Dix-Hallpike Left          Dix-Hallpike Right           Dix-Hallpike Right Duration negative          Dix-Hallpike Left           Dix-Hallpike Left Duration negative          Positional Sensitivities  Up from Right Hallpike Mild dizziness    Up from Left Hallpike Mild dizziness    Positional Sensitivities Comments look up and reach in bilateral directions          Orthostatics           BP sitting 124/55    HR sitting 66    BP standing (after 1 minute) 126/115    HR standing (after 1 minute) 66    BP standing (after 3 minutes) 133/69    HR standing (after 3 minutes) 98    Orthostatics Comment No dizziness                    Therapy/Group: Individual Therapy  [name] [name] PT, DPT  01/27/2022, 7:55 AM

## 2022-01-28 LAB — URINALYSIS, ROUTINE W REFLEX MICROSCOPIC
Bilirubin Urine: NEGATIVE
Glucose, UA: >=500 mg/dL — AB
Hgb urine dipstick: NEGATIVE
Ketones, ur: NEGATIVE mg/dL
Nitrite: NEGATIVE
Protein, ur: NEGATIVE mg/dL
Specific Gravity, Urine: 1.018 (ref 1.005–1.030)
pH: 5 (ref 5.0–8.0)

## 2022-01-28 LAB — GLUCOSE, CAPILLARY
Glucose-Capillary: 125 mg/dL — ABNORMAL HIGH (ref 70–99)
Glucose-Capillary: 134 mg/dL — ABNORMAL HIGH (ref 70–99)
Glucose-Capillary: 164 mg/dL — ABNORMAL HIGH (ref 70–99)
Glucose-Capillary: 151 mg/dL — ABNORMAL HIGH (ref 70–99)

## 2022-01-28 MED ORDER — OXYBUTYNIN CHLORIDE 5 MG PO TABS
5.0000 mg | ORAL_TABLET | Freq: Two times a day (BID) | ORAL | Status: DC
  Administered 2022-01-28 – 2022-01-30 (×5): 5 mg via ORAL
  Filled 2022-01-28 (×6): qty 1

## 2022-01-28 NOTE — Progress Notes (Signed)
Occupational Therapy Session Note  Patient Details  Name: Tammy Good MRN: [id number] Date of Birth: [birth year]  Session 1  Today's Date: 01/28/2022 OT Individual Time: 0109-3235 OT Individual Time Calculation (min): 75 min    Session 2  Today's Date: 01/28/2022 OT Individual Time: 1300-1415 OT Individual Time Calculation (min): 75 min    Short Term Goals: Week 2:  OT Short Term Goal 1 (Week 2): Continue progressing toward LTGs  Skilled Therapeutic Interventions/Progress Updates:    Session 1 Pt received supine with no c/o pain, agreeable to OT session. She was very upset re frequent urination at night and nursing staff denying purewick use. Extensive discussion re reasoning behind this. Also discussed chronic frequent nocturnal urination and notified MD/PA of pt wanting to discuss medication options if possible. She came to EOB with mod I. (S) stand pivot to the w/c from EOB with the RW when EOB elevated to simulate home environment. She completed another stand pivot to the [facility] with (S), no RW, reaching to handle of commode. (S) toileting tasks. Pt did report the BSC pinched her leg again and she was bleeding. Pt completed 3 hops with the RW to the shower with CGA. She was able to pivot remaining distance with (S) using the RW. Bathing tasks completed seated with set up assist. She transferred back to the w/c with close (S), stand pivot without the RW. She completed grooming tasks at the sink with set up assist. Assisted pt in cleaning residual limb and applying lotion (not on the incision). Rewrapped limb. Limb was poorly wrapped d/t it sliding down and creating pockets of edema below the knee. Pt was taken to the therapy gym where she practiced blocked repetition of squat pivot transfers- to be used at home on days when her R knee is giving her trouble. Pt able to carryover with CGA, often poor elevation of hips. Pt returned to her room. Pt was left sitting up in the w/c with all needs  met and call bell within reach.     Session 2 Pt received seated with no c/o pain and agreeable to OT session. Her equipment had arrived- [facility] and w/c. Extended wheel locks not coming off as too tightly pressed on. Was able to remove them with use of a small hammer and then provided education to pt on removing to fit through narrow doorways. Also informed CSW of pt not having a foot rest for the L- would be beneficial for future prosthetic support. She was taken to the therapy gym via w/c. She completed a stand pivot transfer to the mat with (S) using the RW. She completed several activities in standing with single or no UE support to address dynamic balance and standing tolerance. She required min cueing for positioning. She then completed 3x10 UE strengthening circuit with a level 2 theraband- superset of rows and tricep extension. Hands on facilitation for correct form required. Pt completed a stand pivot transfer back to her w/c with close (S) using the RW. Blood left on mat after she stood. Pt found to be bleeding through bandage on her dorsal thigh from previous pinch in the [facility]. Alerted RN to assist in problem solving a dressing to provide more pressure. Pt also incontinent of urine through her brief. She was taken back to her room via w/c. She stood with CGA and required CGA for balance while OT changed incontinence brief. She then completed 2 hops back to bed with the RW with CGA. Assisted RN in  changing bandages to wounds. Pt left supine with all needs met, bed alarm set.    Therapy Documentation Precautions:  Precautions Precautions: Fall Restrictions Weight Bearing Restrictions: No LLE Weight Bearing: Non weight bearing  Therapy/Group: Individual Therapy  Curtis Sites 01/28/2022, 6:32 AM

## 2022-01-28 NOTE — Progress Notes (Signed)
Physical Therapy Session Note  Patient Details  Name: Tammy Good MRN: [id number] Date of Birth: 01-Apr-1950  Today's Date: 01/28/2022 PT Individual Time: 2992-4268 PT Individual Time Calculation (min): 56 min   Short Term Goals: Week 1:  PT Short Term Goal 1 (Week 1): Pt will transfer to and from [facility] with CGA PT Short Term Goal 1 - Progress (Week 1): Met PT Short Term Goal 2 (Week 1): Pt will ambulate 54f with min assist and LRAD PT Short Term Goal 2 - Progress (Week 1): Progressing toward goal PT Short Term Goal 3 (Week 1): pt will perform stair management with use of shower chair PT Short Term Goal 3 - Progress (Week 1): Discontinued (comment) (Family is to install a ramp- Stair mobility goals to be discontinued.) Week 2:  PT Short Term Goal 1 (Week 2): STG=LTG secondary to ELOS  Skilled Therapeutic Interventions/Progress Updates:   Received pt sitting in WC, pt agreeable to PT treatment, and upset about nursing taking away purewick last night, resulting in her not getting much sleep - explained reasoning of removing purewick to promote independence with toileting. Pt reported pain 3/10 in L residual limb (premedicated). Session with emphasis on functional mobility/transfers, generalized strengthening and endurance, dynamic standing balance, and ambulation. Noted pt sitting in WC without shrinker or limb guard, stating she is waiting for [employer] to bring new limb guard and surgeon wants to wait until staples are removed before ordering shrinker. Pt performed WC mobility 1260fusing BUE and supervision to dayroom with emphasis on UE strength. Stood with RW and min A and ambulated [id number] RW and CGA with close WC follow - limited by fatigue and pt demonstrating poor R foot clearance as a result. Pt transferred to/from mat via stand<>pivot with RW and CGA. Stood x 3 reps from mat with RW and CGA and worked on dynAssociate Professorrseshoes with RUE x 3 trials - pt reported mild  dizziness during trial 1 but subsided with remainder of activity.   Pt then transferred sit<>supine with supervision/mod I and performed supine SLR 2x15 on LLE, single leg bridges 2x5, R sidelying L hip abduction 2x10, and R sidelying L hip extension 2x10. Pt transferred R sidelying<>sitting EOM with supervision and returned to WC.[facility] and concluded session with [facility] needs within reach.  Therapy Documentation Precautions:  Precautions Precautions: Fall Restrictions Weight Bearing Restrictions: No LLE Weight Bearing: Non weight bearing  Therapy/Group: Individual Therapy AnnAlfonse Alpers, DPT  01/28/2022, 7:03 AM

## 2022-01-28 NOTE — Progress Notes (Signed)
PROGRESS NOTE   Subjective/Complaints: She had difficulty last night due to chronic urinary urgency. This made it hard for her to sleep because if she can't get to the toilet in time she has incontinence and this requires change of bed sheets. No other concerns.   Review of Systems  Constitutional:  Negative for chills and fever.  HENT:  Negative for congestion and hearing loss.   Eyes:  Negative for double vision.  Respiratory:  Negative for cough.   Cardiovascular:  Negative for chest pain.  Gastrointestinal:  Positive for constipation. Negative for abdominal pain, diarrhea, heartburn and vomiting.  Genitourinary:  Positive for frequency and urgency.  Neurological:  Negative for headaches.      Objective:   No results found. Recent Labs    01/27/22 0548  WBC 10.5  HGB 8.5*  HCT 28.9*  PLT 575*     Recent Labs    01/27/22 0548  NA 138  K 3.6  CL 109  CO2 24  GLUCOSE 90  BUN 23  CREATININE 0.62  CALCIUM 8.4*     Intake/Output Summary (Last 24 hours) at 01/28/2022 0815 Last data filed at 01/28/2022 0809 Gross per 24 hour  Intake 720 ml  Output --  Net 720 ml         Physical Exam: Vital Signs Blood pressure 135/62, pulse 72, temperature 98.2 F (36.8 C), temperature source Oral, resp. rate 18, height '5\' 7"'$  (1.702 m), weight 86.5 kg, SpO2 100 %.   Constitutional: No distress . Vital signs reviewed. HEENT: NCAT, EOMI, oral membranes moist Neck: supple Cardiovascular: RRR without murmur. No JVD    Respiratory/Chest: CTA Bilaterally without wheezes or rales. No increased WOB GI/Abdomen: BS +, non-tender, non-distended Ext: no clubbing, cyanosis Psych: pleasant and cooperative  Musc: left BK with decreased edema, sl bulbous at distal end Skin: warm and dry Neurologic: Cranial nerves II through XII intact, motor strength is 5/5 in bilateral deltoid, bicep, tricep, grip, right hip flexor, knee  extensors, ankle dorsiflexor and plantar flexor, 4+/5 left HF      Assessment/Plan: 1. Functional deficits which require 3+ hours per day of interdisciplinary therapy in a comprehensive inpatient rehab setting. Physiatrist is providing close team supervision and 24 hour management of active medical problems listed below. Physiatrist and rehab team continue to assess barriers to discharge/monitor patient progress toward functional and medical goals  Care Tool:  Bathing    Body parts bathed by patient: Left arm, Right arm, Chest, Abdomen, Front perineal area, Left upper leg, Right upper leg, Face, Buttocks, Right lower leg   Body parts bathed by helper: Right lower leg, Buttocks Body parts n/a: Left lower leg   Bathing assist Assist Level: Supervision/Verbal cueing     Upper Body Dressing/Undressing Upper body dressing   What is the patient wearing?: Pull over shirt    Upper body assist Assist Level: Independent    Lower Body Dressing/Undressing Lower body dressing      What is the patient wearing?: Underwear/pull up, Pants     Lower body assist Assist for lower body dressing: Contact Guard/Touching assist     Toileting Toileting    Toileting assist Assist for  toileting: Contact Guard/Touching assist     Transfers Chair/bed transfer  Transfers assist     Chair/bed transfer assist level: Contact Guard/Touching assist     Locomotion Ambulation   Ambulation assist      Assist level: Moderate Assistance - Patient 50 - 74% Assistive device: Walker-rolling Max distance: 88f   Walk 10 feet activity   Assist     Assist level: Moderate Assistance - Patient - 50 - 74% Assistive device: Walker-rolling   Walk 50 feet activity   Assist Walk 50 feet with 2 turns activity did not occur: Safety/medical concerns         Walk 150 feet activity   Assist Walk 150 feet activity did not occur: Safety/medical concerns         Walk 10 feet on uneven  surface  activity   Assist Walk 10 feet on uneven surfaces activity did not occur: Safety/medical concerns         Wheelchair     Assist Is the patient using a wheelchair?: Yes Type of Wheelchair: Manual    Wheelchair assist level: Minimal Assistance - Patient > 75% Max wheelchair distance: 150    Wheelchair 50 feet with 2 turns activity    Assist        Assist Level: Minimal Assistance - Patient > 75%   Wheelchair 150 feet activity     Assist      Assist Level: Minimal Assistance - Patient > 75%   Blood pressure 135/62, pulse 72, temperature 98.2 F (36.8 C), temperature source Oral, resp. rate 18, height '5\' 7"'$  (1.702 m), weight 86.5 kg, SpO2 100 %.    Medical Problem List and Plan: 1. 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 Will consult [employer] to provide her more information 2.  Antithrombotics: -DVT/anticoagulation:  Pharmaceutical: Eliquis resumed on 10/27             -antiplatelet therapy: Plavix 3. Pain Management:  hydrocodone prn.  4. Mood/Behavior/Sleep:  LCSW to follow for evaluation and support.              -antipsychotic agents:  N?A 5. Neuropsych/cognition: This patient is capable of making decisions on her own behalf. 6. Skin/Wound Care: Monitor wound for healing.   --Protein supplements and vitamins added to promote healing.  7. Fluids/Electrolytes/Nutrition: Monitor I/O. Check CMET on 10/30 8.  CAD: Monitor for symptoms with increase in  activity. Continue ASA, Plavix, Lipitor. Nitroglycerin PRN. Denies chest pain 9. Hypotension: Monitor BP TID.  Has been stable with midodrine BID.  10 T2DM: Hgb A1C- 5.4 and well controlled. continue farxiga '10mg'$  daily             --Continue Insulin Glargine will  adjust to 22 units from 24 units with SSI for elevated BS.              --11/3 Will increase glargine back up to 24 units  11/7 few elevated CBGs continue monitor trend CBG (last 3)  Recent Labs    01/27/22 1651 01/27/22 2127 01/28/22 0752  GLUCAP 184* 238* 125*         Latest Ref Rng & Units 01/27/2022    5:48 AM 01/20/2022    5:47 AM 01/17/2022    7:35 AM  CBC  WBC 4.0 - 10.5 K/uL 10.5  8.1  7.6   Hemoglobin 12.0 - 15.0 g/dL 8.5  8.3  8.0   Hematocrit 36.0 - 46.0 % 28.9  28.5  26.6   Platelets 150 - 400 K/uL 575  671  465    .  11. ABLA on anemia of chronic disease: Stable stable in 7.2->8.0 today.  --Monitor for signs of bleeding as Eliquis and Plavix resumed 10/27. If Hgb drops may need to hold plavix --h/o Hems (Dr. Collene Mares) -Continue ferrous sulfate -11/6 WBC 8.5 12. PAD: Continue Plavix and lipitor 13. Gastroparesis?: Was on reglan PRN PTA.  14. Constipation             -LBM 11/1, improved, continue miralax daily  -LBM 11/6  15. Hypothyroidism             -Continue synthroid  16. Afib             -Continue Eliquis   -HR controlled  17. Thrombocytosis, likely reactive  -10/7 improved to 575 on labs yesterday  18. Chronic urinary frequency  -Order purwick at night, sleeping better with this device  -10/7 Will check PVR, start oxybutynin '5mg'$  BID, check U/A and culture 19. K knee pain, likely OA  -11/1 Start voltaren gel  -improved with voltaren gel, continue to monitor, consider knee injection next week if pain worsens again 20. Thrombocytosis   -likely reactive, improved today  LOS: 11 days A FACE TO FACE EVALUATION WAS PERFORMED  Jennye Boroughs 01/28/2022, 8:15 AM

## 2022-01-28 NOTE — Progress Notes (Signed)
RN to bedside per IV Team consult for Central Line/ PICC line assessment- Upon arrival patient without any vascular lines. Spoke with primary RN, Marzetta Board. RN aware. No need for IV access. Will re-consult if need arises.

## 2022-01-28 NOTE — Progress Notes (Signed)
Patient ID: Tammy Good, female   DOB: 01-27-1951, 71 y.o.   MRN: [id number]  Met with  pt to discuss equipment and home health. She prefers [facility] and does not need the tub bench. Have cancelled the tub bench and made referral to [facility] for pt's follow up. Work toward discharge 11/10.

## 2022-01-29 DIAGNOSIS — K5900 Constipation, unspecified: Secondary | ICD-10-CM

## 2022-01-29 LAB — GLUCOSE, CAPILLARY
Glucose-Capillary: 72 mg/dL (ref 70–99)
Glucose-Capillary: 109 mg/dL — ABNORMAL HIGH (ref 70–99)
Glucose-Capillary: 175 mg/dL — ABNORMAL HIGH (ref 70–99)
Glucose-Capillary: 198 mg/dL — ABNORMAL HIGH (ref 70–99)

## 2022-01-29 MED ORDER — ENSURE MAX PROTEIN PO LIQD: 11.0000 [oz_av] | Freq: Two times a day (BID) | ORAL | Status: DC

## 2022-01-29 NOTE — Progress Notes (Signed)
Occupational Therapy Session Note  Patient Details  Name: Tammy Good MRN: [id number] Date of Birth: Feb 06, 1951  Today's Date: 01/29/2022 OT Individual Time: 7169-6789 OT Individual Time Calculation (min): 57 min    Short Term Goals: Week 2:  OT Short Term Goal 1 (Week 2): Continue progressing toward LTGs  Skilled Therapeutic Interventions/Progress Updates:    Pt received supine with no c/o pain, agreeable to OT session. Ace wrap poorly on residual limb. Inspected skin. Reduced erythema and wound appears to be healing well. Pt reports some itching along lateral side of incision. Pt does have an area of extra skin along the medial incision. Bed mobility with mod I. Stand pivot transfer with mod I to the w/c. She completed UB ADLs at the sink with set up assist. LB ADLs with (S) standing at the sink. Her L inner thigh skin tear had saturated through her dressing so RN was consulted to redress wound. Will also consult PA. Re-wrapped residual limb for edema management. She was then taken to the therapy gym via w/c. She completed 3 sets of 2 min of BUE ergometer to address UE endurance and strength needed to propel w/c functionally. She returned to her room and was left sitting up in the w/ c with all needs met.   Therapy Documentation Precautions:  Precautions Precautions: Fall Restrictions Weight Bearing Restrictions: No LLE Weight Bearing: Non weight bearing  Therapy/Group: Individual Therapy  Curtis Sites 01/29/2022, 6:39 AM

## 2022-01-29 NOTE — Plan of Care (Signed)
Goals downgraded/discharged to reflect current level of function and new installation of ramp at home in order to ensure safe entry- S.R. 01/29/2022   Problem: RH Ambulation Goal: LTG Patient will ambulate in controlled environment (PT) Description: LTG: Patient will ambulate in a controlled environment, # of feet with assistance (PT). Outcome: Not Applicable Flowsheets (Taken 01/29/2022 1404) LTG: Pt will ambulate in controlled environ  assist needed:: (Discharging gait goal due to CLOF- 11/8 S.R.) --   Problem: RH Stairs Goal: LTG Patient will ambulate up and down stairs w/assist (PT) Description: LTG: Patient will ambulate up and down # of stairs with assistance (PT) Outcome: Not Applicable Flowsheets (Taken 01/29/2022 1404) LTG: Pt will ambulate up/down stairs assist needed:: (Patient has a ramp installed for safe entry into home- Stair goal discharged 11/8- S.R.) --   Problem: RH Ambulation Goal: LTG Patient will ambulate in home environment (PT) Description: LTG: Patient will ambulate in home environment, # of feet with assistance (PT). Flowsheets (Taken 01/29/2022 1404) LTG: Pt will ambulate in home environ  assist needed:: Contact Guard/Touching assist LTG: Ambulation distance in home environment: 10'

## 2022-01-29 NOTE — Progress Notes (Signed)
Patient ID: Tammy Good, female   DOB: 03/19/1951, 71 y.o.   MRN: [id number]  Met with pt to discuss team conference meeting goals and discharge still 11/10. Pt reports her daughter can come Friday at 9:00 for any education needed and then discharge home due to she is off for son;s birthday. Have scheduled education for 9:00 Friday-team aware of this. Pt has her equipment and home health is arranged. Pt is pleased with his progress and feels ready to go home.

## 2022-01-29 NOTE — Progress Notes (Addendum)
PROGRESS NOTE   Subjective/Complaints: Reports she slept better with purewick last night. She also feels like bladder urgency were improved with oxybutynin. No new concerns.   Review of Systems  Constitutional:  Negative for chills and fever.  HENT:  Negative for congestion and hearing loss.   Eyes:  Negative for double vision.  Respiratory:  Negative for cough.   Cardiovascular:  Negative for chest pain and palpitations.  Gastrointestinal:  Positive for constipation. Negative for abdominal pain, diarrhea, heartburn and vomiting.  Genitourinary:  Positive for frequency and urgency.  Neurological:  Negative for headaches.      Objective:   No results found. Recent Labs    01/27/22 0548  WBC 10.5  HGB 8.5*  HCT 28.9*  PLT 575*     Recent Labs    01/27/22 0548  NA 138  K 3.6  CL 109  CO2 24  GLUCOSE 90  BUN 23  CREATININE 0.62  CALCIUM 8.4*      Intake/Output Summary (Last 24 hours) at 01/29/2022 0820 Last data filed at 01/29/2022 0811 Gross per 24 hour  Intake 360 ml  Output 750 ml  Net -390 ml         Physical Exam: Vital Signs Blood pressure 134/61, pulse (!) 59, temperature 97.7 F (36.5 C), temperature source Oral, resp. rate 16, height '5\' 7"'$  (1.702 m), weight 87.7 kg, SpO2 100 %.   Constitutional: No distress . Vital signs reviewed. HEENT: NCAT, EOMI, oral membranes moist Neck: supple Cardiovascular: RRR without murmur. No JVD    Respiratory/Chest: CTA Bilaterally without wheezes or rales. No increased WOB GI/Abdomen: BS +, non-tender, non-distended Ext: no clubbing, cyanosis Psych: pleasant and cooperative  Musc: left BK with decreased edema, sl bulbous at distal end Skin: warm and dry Neurologic: Cranial nerves II through XII intact, motor strength is 5/5 in bilateral deltoid, bicep, tricep, grip, right hip flexor, knee extensors, ankle dorsiflexor and plantar flexor, 4+/5 left HF       Assessment/Plan: 1. Functional deficits which require 3+ hours per day of interdisciplinary therapy in a comprehensive inpatient rehab setting. Physiatrist is providing close team supervision and 24 hour management of active medical problems listed below. Physiatrist and rehab team continue to assess barriers to discharge/monitor patient progress toward functional and medical goals  Care Tool:  Bathing    Body parts bathed by patient: Left arm, Right arm, Chest, Abdomen, Front perineal area, Left upper leg, Right upper leg, Face, Buttocks, Right lower leg   Body parts bathed by helper: Right lower leg, Buttocks Body parts n/a: Left lower leg   Bathing assist Assist Level: Set up assist     Upper Body Dressing/Undressing Upper body dressing   What is the patient wearing?: Pull over shirt    Upper body assist Assist Level: Independent    Lower Body Dressing/Undressing Lower body dressing      What is the patient wearing?: Underwear/pull up, Pants     Lower body assist Assist for lower body dressing: Supervision/Verbal cueing     Toileting Toileting    Toileting assist Assist for toileting: Independent with assistive device     Transfers Chair/bed transfer  Transfers assist  Chair/bed transfer assist level: Independent with assistive device     Locomotion Ambulation   Ambulation assist      Assist level: Moderate Assistance - Patient 50 - 74% Assistive device: Walker-rolling Max distance: 19f   Walk 10 feet activity   Assist     Assist level: Moderate Assistance - Patient - 50 - 74% Assistive device: Walker-rolling   Walk 50 feet activity   Assist Walk 50 feet with 2 turns activity did not occur: Safety/medical concerns         Walk 150 feet activity   Assist Walk 150 feet activity did not occur: Safety/medical concerns         Walk 10 feet on uneven surface  activity   Assist Walk 10 feet on uneven surfaces activity  did not occur: Safety/medical concerns         Wheelchair     Assist Is the patient using a wheelchair?: Yes Type of Wheelchair: Manual    Wheelchair assist level: Supervision/Verbal cueing Max wheelchair distance: 1254f   Wheelchair 50 feet with 2 turns activity    Assist        Assist Level: Supervision/Verbal cueing   Wheelchair 150 feet activity     Assist      Assist Level: Minimal Assistance - Patient > 75%   Blood pressure 134/61, pulse (!) 59, temperature 97.7 F (36.5 C), temperature source Oral, resp. rate 16, height '5\' 7"'$  (1.702 m), weight 87.7 kg, SpO2 100 %.    Medical Problem List and Plan: 1. 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 Will consult [employer] to provide her more information  -Team conference today 2.  Antithrombotics: -DVT/anticoagulation:  Pharmaceutical: Eliquis resumed on 10/27             -antiplatelet therapy: Plavix 3. Pain Management:  hydrocodone prn.  4. Mood/Behavior/Sleep:  LCSW to follow for evaluation and support.              -antipsychotic agents:  N?A 5. Neuropsych/cognition: This patient is capable of making decisions on her own behalf. 6. Skin/Wound Care: Monitor wound for healing.   --Protein supplements and vitamins added to promote healing.  7. Fluids/Electrolytes/Nutrition: Monitor I/O. Check CMET on 10/30 8.  CAD: Monitor for symptoms with increase in activity. Continue ASA, Plavix, Lipitor. Nitroglycerin PRN. Denies chest pain 9. Hypotension: Monitor BP TID.  Has been stable with midodrine BID.  10 T2DM: Hgb A1C- 5.4 and well  controlled. continue farxiga '10mg'$  daily             --Continue Insulin Glargine will adjust to 22 units from 24 units with SSI for elevated BS.              --11/3 Will increase glargine back up to 24 units  11/7 few elevated CBGs continue monitor trend  -11/8CBGs  better today and yesterday, monitor CBG (last 3)  Recent Labs    01/28/22 1649 01/28/22 2117 01/29/22 0614  GLUCAP 164* 151* 72         Latest Ref Rng & Units 01/27/2022    5:48 AM 01/20/2022    5:47 AM 01/17/2022    7:35 AM  CBC  WBC 4.0 - 10.5 K/uL 10.5  8.1  7.6   Hemoglobin 12.0 - 15.0 g/dL 8.5  8.3  8.0   Hematocrit 36.0 - 46.0 % 28.9  28.5  26.6   Platelets 150 - 400 K/uL 575  671  465    .  11. ABLA on anemia of chronic disease: Stable stable in 7.2->8.0 today.  --Monitor for signs of bleeding as Eliquis and Plavix resumed 10/27. If Hgb drops may need to hold plavix --h/o Hems (Dr. Collene Mares) -Continue ferrous sulfate -11/6 WBC 8.5 12. PAD: Continue Plavix and lipitor 13. Gastroparesis?: Was on reglan PRN PTA.  14. Constipation             -LBM 11/1, improved, continue miralax daily  -LBM 11/8 large- having regular bms 15. Hypothyroidism             -Continue synthroid  16. Afib             -Continue Eliquis   -HR controlled  17. Thrombocytosis, likely reactive  -10/7 improved to 575 on labs yesterday  18. Chronic urinary frequency  -Order purwick at night, sleeping better with this device  -10/7 Will check PVR, start oxybutynin '5mg'$  BID, check U/A and culture  -10/8 reports improved after oxybutynin started, will ask nursing to check PVR 19. K knee pain, likely OA  -11/1 Start voltaren gel  -improved with voltaren gel, continue to monitor, consider knee injection next week if pain worsens again 20. Thrombocytosis   -likely reactive, improved 11/6  LOS: 12 days A FACE TO [place] 01/29/2022, 8:20 AM

## 2022-01-29 NOTE — Progress Notes (Signed)
Physical Therapy Session Note  Patient Details  Name: Tammy Good MRN: [id number] Date of Birth: 04/16/1950  Today's Date: 01/29/2022 PT Individual Time: 0900-1015 and 1500-1530 PT Individual Time Calculation (min): 75 min and 30 min.  Short Term Goals: Week 2:  PT Short Term Goal 1 (Week 2): STG=LTG secondary to ELOS  Skilled Therapeutic Interventions/Progress Updates:   First session:  Pt presents sitting in w/c and agreeable to therapy.  Pt performed all w/c management w/ set-up handing legs to pt.  Pt performed squat pivot transfer w/c to new personal w/c w/ CGA and verbal cues and then able to place leg rests to new w/c w/ verbal cues.  Pt wheeled self to main gym w/ supervision but occasional cues for avoiding objects to right.  Pt transferred sit to stand w/ CGA and then performed step-pivot w/ RW and CGA to mat table, verbal cues for increased foot clearance.  Pt transferred sit to supine w/ supervision.  Pt performed supine there ex to include SLR B LE w/ cues for height, hip/knee flex w/ cues for increased flexion and extension esp. to Left, unilateral bridging, sidelying L hip/kee flex, and extension for stretch to L hip flexors, and then prone lying x 5'.  Pt rolls self back and forth on mat w/ supervision.  Pt transferred sup to sit w/ supervision.  Pt transferred sit to stand w/ supervision and then w/ improved foot clearance, step-pivoted to w/c.  Pt wheeled back to room and performed sit to stand w/ supervision and then step-pivot to original w/c w/ CGA to supervision.  Pt remained sitting in w/c w/ all needs in reach.  Second session:Pt presents sitting in w/c and agreeable to therapy.  Pt wheeled to dayroom w/ supervision.  Pt performed multiple sit to stand transfers w/  supervision and RW, verbal cues for forward lean.  Pt did require CGA to light min from lower surface of recliner.  Pt performed step-pivot transfer w/ RW and CGA w/c<> recliner w/ verbal cues for speed to  allow for increased UE support for LE clearance.  Pt amb x 5' w/ RW and CGA.  Pt performed squat pivot transfers w/c <> recliner w/ CGA and cues for maintaining forward flexed position.  Pt returned to room and remained sitting in w/c w/ all needs in reach.     Therapy Documentation Precautions:  Precautions Precautions: Fall Restrictions Weight Bearing Restrictions: No LLE Weight Bearing: Non weight bearing General:   Vital Signs:  Pain:0/10 Pain Assessment Pain Scale: 0-10 Pain Score: 0-No pain Pain Type: Acute pain;Surgical pain Pain Location: Leg Pain Orientation: Left Pain Descriptors / Indicators: Aching Pain Onset: On-going Pain Intervention(s): Medication (See eMAR) Mobility: Bed Mobility Bed Mobility: Rolling Right;Rolling Left;Supine to Sit;Sit to Sidelying Left;Sit to Supine Rolling Right: Independent Rolling Left: Independent Supine to Sit: Independent Sit to Supine: Independent Transfers Transfers: Sit to Stand;Stand to Sit Sit to Stand: Independent with assistive device Stand to Sit: Independent with assistive device Stand Pivot Transfers: Independent with assistive device Transfer (Assistive device): Rolling walker Locomotion :    Trunk/Postural Assessment : Cervical Assessment Cervical Assessment: Within Functional Limits Thoracic Assessment Thoracic Assessment: Within Functional Limits Lumbar Assessment Lumbar Assessment: Within Functional Limits Postural Control Postural Control: Deficits on evaluation Righting Reactions: delayed 2/2 R BKA  Balance: Balance Balance Assessed: Yes Static Sitting Balance Static Sitting - Level of Assistance: 6: Modified independent (Device/Increase time) Dynamic Sitting Balance Dynamic Sitting - Balance Support: During functional activity Dynamic Sitting -  Level of Assistance: 6: Modified independent (Device/Increase time) Static Standing Balance Static Standing - Balance Support: During functional  activity Static Standing - Level of Assistance: 6: Modified independent (Device/Increase time) Dynamic Standing Balance Dynamic Standing - Balance Support: During functional activity;Bilateral upper extremity supported Dynamic Standing - Level of Assistance: 5: Stand by assistance Exercises:   Other Treatments:      Therapy/Group: Individual Therapy  [name] 01/29/2022, 10:21 AM

## 2022-01-29 NOTE — Progress Notes (Signed)
Physical Therapy Session Note  Patient Details  Name: Tammy Good MRN: [id number] Date of Birth: June 09, 1950  Today's Date: 01/29/2022 PT Individual Time: 1345-1430 PT Individual Time Calculation (min): 45 min   Short Term Goals: Week 1:  PT Short Term Goal 1 (Week 1): Pt will transfer to and from [facility] with CGA PT Short Term Goal 1 - Progress (Week 1): Met PT Short Term Goal 2 (Week 1): Pt will ambulate 61f with min assist and LRAD PT Short Term Goal 2 - Progress (Week 1): Progressing toward goal PT Short Term Goal 3 (Week 1): pt will perform stair management with use of shower chair PT Short Term Goal 3 - Progress (Week 1): Discontinued (comment) (Family is to install a ramp- Stair mobility goals to be discontinued.) Week 2:  PT Short Term Goal 1 (Week 2): STG=LTG secondary to ELOS  Skilled Therapeutic Interventions/Progress Updates:  Patient greeted sitting upright in wheelchair in room and agreeable to PT treatment session- Food/nutrition present to take order prior to exiting room. Patient propelled manual wheelchair from room to rehab gym with B UE ModI- Once in the gym, patient was able to manage parts of the wheelchair independently.   Patient gait trained x8', x4' with RW and CGA- Patient continues to demonstrate decreased hop length and foot clearance and fatigued quickly requiring a seated rest break. VC for pushing through B UE in order to assist with foot clearance.   Patient performed x5 sit/stands with RW and SBA for safety- Patient demonstrated good sequencing and safety awareness with no LOB noted.   Patient stood with RW and performed x20 alternating UE taps to beach ball with rec therapist and SBA from therapist- Patient demonstrated x1 instance of increased UE WB through RW leading to mild unsteadiness, however no true LOB noted- VC for decreased WB through RW.   Patient performed x4 stand pivot transfers to/from wheelchair and mat table with RW and SBA for safety- VC  for stepping all the way back to the surface prior to sitting with good improvements noted. Patient required MinA for placing amputee pad and leg rest back on wheelchair.    Patient propelled manual wheelchair back to her room ModI and left sitting upright in wheelchair with call bell within reach, tray table in front and all needs met.    Therapy Documentation Precautions:  Precautions Precautions: Fall Restrictions Weight Bearing Restrictions: No LLE Weight Bearing: Non weight bearing  Therapy/Group: Individual Therapy  [name] 01/29/2022, 7:49 AM

## 2022-01-29 NOTE — Progress Notes (Signed)
Occupational Therapy Discharge Summary  Patient Details  Name: Tammy Good MRN: [id number] Date of Birth: [birth year]  Date of Discharge from OT service:January 31, 2022  Today's Date: 01/31/2022 OT Individual Time: 0932-1005 OT Individual Time Calculation (min): 33 min   Patient has met 7 of 7 long term goals due to improved activity tolerance, improved balance, postural control, and ability to compensate for deficits.  Patient to discharge at overall Supervision level for ADLs and more dynamic transfers. She can complete toilet transfers and toileting tasks with mod I overall.  Patient's care partner is independent to provide the necessary physical assistance at discharge. Tammy Good has been limited by her chronic L knee pain and weakness however has made excellent progress in her dynamic balance, strength, and ability to compensate for deficits.    Recommendation:  Patient will benefit from ongoing skilled OT services in home health setting to continue to advance functional skills in the area of BADL and iADL.  Equipment: BSC and TTB  Reasons for discharge: treatment goals met and discharge from hospital  Patient/family agrees with progress made and goals achieved: Yes   Skilled OT Intervention Family education session completed with pt and her daughter, son in law, and granddaughter. Verbal education provided re fall risk reduction, energy conservation strategies, home carryover of transfer training, ADLs, and IADLs. Demonstration and hands on training completed for pt performance of UB/LB bathing and dressing at (S)-mod I level, toileting hygiene and transfers, and shower transfers. Provided demonstration, handout, and education re limb loss management and ace wrap donning. Answered all family questions to their satisfaction. She was left sitting up awaiting PA for d/c instructions.    OT Discharge Precautions/Restrictions  Precautions Precautions: Fall Restrictions Weight  Bearing Restrictions: No LLE Weight Bearing: Non weight bearing   ADL ADL Eating: Independent Grooming: Independent Where Assessed-Grooming: Standing at sink Upper Body Bathing: Modified independent Where Assessed-Upper Body Bathing: Shower Lower Body Bathing: Supervision/safety Where Assessed-Lower Body Bathing: Shower Upper Body Dressing: Setup Where Assessed-Upper Body Dressing: Wheelchair Lower Body Dressing: Supervision/safety Where Assessed-Lower Body Dressing: Wheelchair Toileting: Modified independent Where Assessed-Toileting: Bedside Commode Toilet Transfer: Modified independent Armed forces technical officer Method: Arts development officer: Radiographer, therapeutic: Distant supervision Tub/Shower Transfer Method: Teacher, adult education, Stand pivot Tub/Shower Equipment: Gaffer Baseline Vision/History: 0 No visual deficits Patient Visual Report: No change from baseline Vision Assessment?: No apparent visual deficits Perception  Perception: Within Functional Limits Praxis Praxis: Intact Cognition Cognition Overall Cognitive Status: Within Functional Limits for tasks assessed Arousal/Alertness: Awake/alert Memory: Appears intact Awareness: Appears intact Problem Solving: Appears intact Safety/Judgment: Appears intact Brief Interview for Mental Status (BIMS) Repetition of Three Words (First Attempt): 3 Temporal Orientation: Year: Correct Temporal Orientation: Month: Accurate within 5 days Temporal Orientation: Day: Correct Recall: "Sock": Yes, no cue required Recall: "Blue": Yes, no cue required Recall: "Bed": Yes, no cue required BIMS Summary Score: 15 Sensation Sensation Light Touch: Appears Intact Coordination Gross Motor Movements are Fluid and Coordinated: Yes Fine Motor Movements are Fluid and Coordinated: Yes Finger Nose Finger Test: [facility] Motor  Motor Motor: Within Functional Limits Mobility  Bed Mobility Bed Mobility:  Rolling Right;Rolling Left;Supine to Sit;Sit to Sidelying Left;Sit to Supine Rolling Right: Independent Rolling Left: Independent Supine to Sit: Independent Sit to Supine: Independent Transfers Sit to Stand: Independent with assistive device Stand to Sit: Independent with assistive device  Trunk/Postural Assessment  Cervical Assessment Cervical Assessment: Within Functional Limits Thoracic Assessment Thoracic Assessment: Within Functional Limits Lumbar Assessment Lumbar  Assessment: Within Functional Limits Postural Control Postural Control: Deficits on evaluation Righting Reactions: delayed 2/2 R BKA  Balance Balance Balance Assessed: Yes Static Sitting Balance Static Sitting - Level of Assistance: 6: Modified independent (Device/Increase time) Dynamic Sitting Balance Dynamic Sitting - Balance Support: During functional activity Dynamic Sitting - Level of Assistance: 6: Modified independent (Device/Increase time) Static Standing Balance Static Standing - Balance Support: During functional activity Static Standing - Level of Assistance: 6: Modified independent (Device/Increase time) Dynamic Standing Balance Dynamic Standing - Balance Support: During functional activity;Bilateral upper extremity supported Dynamic Standing - Level of Assistance: 5: Stand by assistance Extremity/Trunk Assessment RUE Assessment RUE Assessment: Within Functional Limits LUE Assessment LUE Assessment: Within Functional Limits   Curtis Sites 01/29/2022, 7:54 AM

## 2022-01-29 NOTE — Patient Care Conference (Signed)
Inpatient RehabilitationTeam Conference and Plan of Care Update Date: 01/29/2022   Time: 121:23 PM    Patient Name: Tammy Good      Medical Record Number: [id number]  Date of Birth: 21-Feb-1951 Sex: Female         Room/Bed: 4W08C/4W08C-01 Payor Info: Payor: MEDICARE / Plan: MEDICARE PART A AND B / Product Type: *No Product type* /    Admit Date/Time:  01/17/2022  3:42 PM  Primary Diagnosis:  Unilateral complete BKA, left, initial encounter [facility])  Hospital Problems: Principal Problem:   Unilateral complete BKA, left, initial encounter [facility])    Expected Discharge Date: Expected Discharge Date: 01/31/22  Team Members Present: Physician leading conference: Dr. Jennye Boroughs Social Worker Present: Ovidio Kin, LCSW Nurse Present: Dorien Chihuahua, RN PT Present: Terence Lux, PT OT Present: Laverle Hobby, OT SLP Present: Weston Anna, SLP PPS Coordinator present : Gunnar Fusi, SLP     Current Status/Progress Goal Weekly Team Focus  Bowel/Bladder   Patient is continent and incontinent during night shift, purewick applied/ LBM 01/28/22   retore continence   Assess toileting needs QS/PRN, encourage patient with time toileting and trial runs with removal of purewick during HS hours    Swallow/Nutrition/ Hydration               ADL's   From TTB bathing is set up assist with lateral leans. CGA- (S) ADL transfers with the RW. (S) toileting tasks   supervision for ADLs, mod I toileting to reflect intermittent supervision at home   ADLs, limb loss education/edema management, transfers, d/c planning    Mobility   CGA/SBA for transfers with RW; CGA for gait up to 10'; Supv wc mobility; Indep bed mobility   Supv with RW and short-distance gait  Transfers, gait, dynamic stability, residual limb education/pre-prosthetic education, LE strengthening    Communication                Safety/Cognition/ Behavioral Observations               Pain   S/P left BKA score  2-4/ on pain score, prn Tylenol with relieve verbalized   <2/10 on pain scale   Assess QS//PRN    Skin   rightmid. thigh smaill skin tear   Continue Preventive measures of infection     Assess skin q shift    Discharge Planning:  Pt making good progress in her therapies-she is looking forward to discharge. Equipment ordered and follow up set up   Team Discussion: Patient with left knee pain; improved. Limited by chronic bladder issues; urgency, MD adjusted medications and checking PVRs. Changed to toileting via BSC. Sutures intact to incision with dressing changes/figure 8 wrap. Progress limited by weakness and pain.  Patient on target to meet rehab goals: yes, currently needs CGA for ADLs and supervision for toileting. Bed mobility with mod I and CGA for transfers. Anticipate w/c level at discharge ude to gait limits. Overall goals for discharge set for mod I.  *See Care Plan and progress notes for long and short-term goals.   Revisions to Treatment Plan:  N/a   Teaching Needs: Safety, skin care, medications, transfers, toileting, etc.  Current Barriers to Discharge: Inaccessible home environment and Weight bearing restrictions  Possible Resolutions to Barriers: Self care education/Family education Ramp installation complete HH follow up services DME: BSC, TTB     Medical Summary Current Status: L BKA, DM2, ABLA, urinary urgency, constipation, knee pain  Barriers to Discharge: Weight bearing restrictions;Home  enviroment access/layout;Wound care  Barriers to Discharge Comments: Urinary urgency, R knee pain, DM2 Possible Resolutions to [employer] Focus: monitor DBG, voltaren for knee pain, Started oxybutynin,check pVR   Continued Need for Acute Rehabilitation Level of Care: The patient requires daily medical management by a physician with specialized training in physical medicine and rehabilitation for the following reasons: Direction of a multidisciplinary physical  rehabilitation program to maximize functional independence : Yes Medical management of patient stability for increased activity during participation in an intensive rehabilitation regime.: Yes Analysis of laboratory values and/or radiology reports with any subsequent need for medication adjustment and/or medical intervention. : Yes   I attest that I was present, lead the team conference, and concur with the assessment and plan of the team.   Dorien Chihuahua B 01/29/2022, 3:58 PM

## 2022-01-30 ENCOUNTER — Telehealth (HOSPITAL_COMMUNITY): Payer: Self-pay | Admitting: Pharmacy Technician

## 2022-01-30 ENCOUNTER — Other Ambulatory Visit (HOSPITAL_COMMUNITY): Payer: Self-pay

## 2022-01-30 LAB — URINE CULTURE: Culture: 40000 — AB

## 2022-01-30 LAB — GLUCOSE, CAPILLARY
Glucose-Capillary: 87 mg/dL (ref 70–99)
Glucose-Capillary: 171 mg/dL — ABNORMAL HIGH (ref 70–99)
Glucose-Capillary: 151 mg/dL — ABNORMAL HIGH (ref 70–99)
Glucose-Capillary: 174 mg/dL — ABNORMAL HIGH (ref 70–99)

## 2022-01-30 MED ORDER — LUBRIDERM SERIOUSLY SENSITIVE EX LOTN: 1.0000 | TOPICAL_LOTION | Freq: Two times a day (BID) | CUTANEOUS | 0 refills | Status: DC

## 2022-01-30 MED ORDER — BASAGLAR KWIKPEN 100 UNIT/ML ~~LOC~~ SOPN: 24.0000 [IU] | PEN_INJECTOR | Freq: Every day | SUBCUTANEOUS | Status: AC

## 2022-01-30 MED ORDER — DICLOFENAC SODIUM 1 % EX GEL
4.0000 g | Freq: Four times a day (QID) | CUTANEOUS | 0 refills | Status: AC
  Filled 2022-01-30: qty 400, 25d supply, fill #0

## 2022-01-30 MED ORDER — DEXAMETHASONE 0.5 MG PO TABS: 0.5000 mg | ORAL_TABLET | Freq: Every day | ORAL | Status: AC

## 2022-01-30 MED ORDER — DOCUSATE SODIUM 100 MG PO CAPS
100.0000 mg | ORAL_CAPSULE | Freq: Two times a day (BID) | ORAL | 0 refills | Status: DC
  Filled 2022-01-30: qty 60, 30d supply, fill #0

## 2022-01-30 MED ORDER — TAMSULOSIN HCL 0.4 MG PO CAPS
0.4000 mg | ORAL_CAPSULE | Freq: Every day | ORAL | Status: DC
  Administered 2022-01-30 – 2022-01-31 (×2): 0.4 mg via ORAL
  Filled 2022-01-30 (×2): qty 1

## 2022-01-30 NOTE — Telephone Encounter (Signed)
Patient Advocate Encounter  Prior Authorization for Diclofenac Sodium 1% gel has been approved.    PA# [id number] Effective dates: 01/30/2022 through 01/30/2025      Lyndel Safe, [place] Patient Advocate Specialist [place] Patient Advocate Team Direct Number: [phone]  Fax: [phone]

## 2022-01-30 NOTE — Progress Notes (Signed)
Occupational Therapy Session Note  Patient Details  Name: Tammy Good MRN: [id number] Date of Birth: [birth year]  Today's Date: 01/30/2022 OT Individual Time: 1415-1530 OT Individual Time Calculation (min): 75 min    Short Term Goals: Week 2:  OT Short Term Goal 1 (Week 2): Continue progressing toward LTGs  Skilled Therapeutic Interventions/Progress Updates:   Pt in bed resting upon OT arrival. Very low to no pain reported- 2/10 in L residual limb. Discussed d/c planning and pt agreeable to final shower retraining visit. Waterproofing training over L ACE wrap instruction on L residual limb. Toileting with mod I for nursing to conduct bladder scan to measure residual with no concern present with volumes as per Heidi. Off and on 3 in 1 commode next to bed and peri hygiene with mod I. Transfer back to w/c with RW with mod I. Full shower retraining on tub transfer bench with mod I for access from w/c with grab bar. Doffing all clothing mod I. Lateral leans Bly for peri and buttocks hygiene and all other body parts with mod I as well. Returned to w/c then EOB to don night gown, apply lotion and incontinence brief with mod I.  Educated on energy conservation, task simplification and falls prevention for ADL's and simple IADL's. Rec reach and provided instruction on how to obtain. Light HEP instructed with breathing strategies, generalized cdp mngt and wound healing as well as red (medium) tband for scap, sh, elbow 10 reps each x 2 sets per day. Teach back with written info indep as well as strategies to avoid joint overuse. Pt to see OT one final session for Family Educ according to notes left and MSW confirmation. Left pt bed level with all safety measures and needs in place.    Therapy Documentation Precautions:  Precautions Precautions: Fall Restrictions Weight Bearing Restrictions: No LLE Weight Bearing: Non weight bearing    Therapy/Group: Individual Therapy  Barnabas Lister 01/30/2022, 7:44 AM

## 2022-01-30 NOTE — Progress Notes (Signed)
PROGRESS NOTE   Subjective/Complaints: Patient reports she is doing well overall.  She is happy with her progress with therapy.  She did have an elevated bladder scan and had IC volume of 600.  No additional concerns.  Review of Systems  Constitutional:  Negative for chills and fever.  HENT:  Negative for hearing loss.   Eyes:  Negative for blurred vision and double vision.  Respiratory:  Negative for cough.   Cardiovascular:  Negative for chest pain and palpitations.  Gastrointestinal:  Positive for constipation. Negative for abdominal pain, diarrhea, heartburn, nausea and vomiting.  Genitourinary:  Positive for frequency and urgency. Negative for dysuria.  Neurological:  Negative for headaches.      Objective:   No results found. No results for input(s): "WBC", "HGB", "HCT", "PLT" in the last 72 hours.   No results for input(s): "NA", "K", "CL", "CO2", "GLUCOSE", "BUN", "CREATININE", "CALCIUM" in the last 72 hours.    Intake/Output Summary (Last 24 hours) at 01/30/2022 1210 Last data filed at 01/30/2022 0742 Gross per 24 hour  Intake 996 ml  Output 600 ml  Net 396 ml         Physical Exam: Vital Signs Blood pressure (!) 144/51, pulse 62, temperature 98.2 F (36.8 C), temperature source Oral, resp. rate 14, height '5\' 7"'$  (1.702 m), weight 87 kg, SpO2 100 %.   Constitutional: No distress . Vital signs reviewed. HEENT: NCAT, AT, conjugate gaze, MMM Neck: supple Cardiovascular: RRR without murmur. No JVD    Respiratory/Chest: CTA Bilaterally without wheezes or rales.  Good air movement GI/Abdomen: BS +, non-tender, non-distended,soft Ext: no clubbing, cyanosis Psych: pleasant and cooperative  Musc: left BK with decreased edema, sl bulbous at distal end, no signs of infection Skin: warm and dry Neurologic: Cranial nerves II through XII intact, motor strength is 5/5 in bilateral deltoid, bicep, tricep, grip,  right hip flexor, knee extensors, ankle dorsiflexor and plantar flexor, 4+/5 left HF      Assessment/Plan: 1. Functional deficits which require 3+ hours per day of interdisciplinary therapy in a comprehensive inpatient rehab setting. Physiatrist is providing close team supervision and 24 hour management of active medical problems listed below. Physiatrist and rehab team continue to assess barriers to discharge/monitor patient progress toward functional and medical goals  Care Tool:  Bathing    Body parts bathed by patient: Left arm, Right arm, Chest, Abdomen, Front perineal area, Left upper leg, Right upper leg, Face, Buttocks, Right lower leg   Body parts bathed by helper: Right lower leg, Buttocks Body parts n/a: Left lower leg   Bathing assist Assist Level: Set up assist     Upper Body Dressing/Undressing Upper body dressing   What is the patient wearing?: Pull over shirt    Upper body assist Assist Level: Independent    Lower Body Dressing/Undressing Lower body dressing      What is the patient wearing?: Underwear/pull up, Pants     Lower body assist Assist for lower body dressing: Supervision/Verbal cueing     Toileting Toileting    Toileting assist Assist for toileting: Independent with assistive device     Transfers Chair/bed transfer  Transfers assist  Chair/bed transfer assist level: Independent with assistive device Chair/bed transfer assistive device: Museum/gallery exhibitions officer assist      Assist level: Supervision/Verbal cueing Assistive device: Walker-rolling Max distance: 10   Walk 10 feet activity   Assist     Assist level: Supervision/Verbal cueing Assistive device: Walker-rolling   Walk 50 feet activity   Assist Walk 50 feet with 2 turns activity did not occur: Safety/medical concerns         Walk 150 feet activity   Assist Walk 150 feet activity did not occur: Safety/medical concerns          Walk 10 feet on uneven surface  activity   Assist Walk 10 feet on uneven surfaces activity did not occur: Safety/medical concerns         Wheelchair     Assist Is the patient using a wheelchair?: Yes Type of Wheelchair: Manual    Wheelchair assist level: Independent Max wheelchair distance: 150+    Wheelchair 50 feet with 2 turns activity    Assist        Assist Level: Independent   Wheelchair 150 feet activity     Assist      Assist Level: Independent   Blood pressure (!) 144/51, pulse 62, temperature 98.2 F (36.8 C), temperature source Oral, resp. rate 14, height '5\' 7"'$  (1.702 m), weight 87 kg, SpO2 100 %.    Medical Problem List and Plan: 1. Functional deficits secondary to L BKA             -patient may shower if incision kept covered             -ELOS/Goals: 11/10  supervision with PT and OT           -Continue CIR therapies including PT, OT              -Wound vac was removed, surgery considering leaving sutures in place 4 weeks postop depending on wound healing             -NWB LLE, follow-up with surgery after discharge  -She was able to get Ramp  -discussed prosthetic process with pt today. Will consult [employer] to provide her more information 2.  Antithrombotics: -DVT/anticoagulation:  Pharmaceutical: Eliquis resumed on 10/27             -antiplatelet therapy: Plavix 3. Pain Management:  hydrocodone prn.  4. Mood/Behavior/Sleep:  LCSW to follow for evaluation and support.              -antipsychotic agents:  N?A 5. Neuropsych/cognition: This patient is capable of making decisions on her own behalf. 6. Skin/Wound Care: Monitor wound for healing.   --Protein supplements and vitamins added to promote healing.  7. Fluids/Electrolytes/Nutrition: Monitor I/O. Check CMET on 10/30 8.  CAD: Monitor for symptoms with increase in activity. Continue ASA, Plavix, Lipitor. Nitroglycerin PRN. Denies chest pain 9. Hypotension: Monitor BP TID.  Has been  stable with midodrine BID.   -11/9 BP has been stable, monitor due to addition of Flomax 10 T2DM: Hgb A1C- 5.4 and well controlled. continue farxiga '10mg'$  daily             --Continue Insulin Glargine will adjust to 22 units from 24 units with SSI for elevated BS.              --11/3 Will increase glargine back up to 24 units  11/7 few elevated CBGs continue monitor trend  -11/9 CBGs fairly  well controlled overall although she has occasional mildly elevated values, a.m. glucose 87 continue glargine at current dose, continue farxiga CBG (last 3)  Recent Labs    01/29/22 2106 01/30/22 0632 01/30/22 1150  GLUCAP 198* 87 171*         Latest Ref Rng & Units 01/27/2022    5:48 AM 01/20/2022    5:47 AM 01/17/2022    7:35 AM  CBC  WBC 4.0 - 10.5 K/uL 10.5  8.1  7.6   Hemoglobin 12.0 - 15.0 g/dL 8.5  8.3  8.0   Hematocrit 36.0 - 46.0 % 28.9  28.5  26.6   Platelets 150 - 400 K/uL 575  671  465    .  11. ABLA on anemia of chronic disease: Stable stable in 7.2->8.0 today.  --Monitor for signs of bleeding as Eliquis and Plavix resumed 10/27. If Hgb drops may need to hold plavix --h/o [facility] (Dr. Collene Mares) -Continue ferrous sulfate -11/6 WBC 8.5 12. PAD: Continue Plavix and lipitor 13. Gastroparesis?: Was on reglan PRN PTA.  14. Constipation             -LBM 11/1, improved, continue miralax daily  -11/9 last BM yesterday, continue MiraLAX 15. Hypothyroidism             -Continue synthroid  16. Afib             -Continue Eliquis   -HR controlled  17. Thrombocytosis, likely reactive  -11/7 improved to 575 on labs yesterday  18. Chronic urinary frequency  -Order purwick at night, sleeping better with this device  -11/7 Will check PVR, start oxybutynin '5mg'$  BID, check U/A and culture  -11/8 reports improved after oxybutynin started, will ask nursing to check PVR  -11/9 elevated bladder scan requiring IC, we will discontinue oxybutynin, will order Flomax, continue bladder scans, urine culture  with E. coli and Klebsiella 40,000 and 10,000 CFU I suspect this is contaminant 19. K knee pain, likely OA  -11/1 Start voltaren gel  -improved with voltaren gel, continue to monitor, consider knee injection next week if pain worsens again 20. Thrombocytosis   -likely reactive, improved 11/6  LOS: 13 days A FACE TO [place] 01/30/2022, 12:10 PM

## 2022-01-30 NOTE — Progress Notes (Addendum)
Physical Therapy Discharge Summary  Patient Details  Name: Tammy Good MRN: [id number] Date of Birth: 1950-08-30  Date of Discharge from PT service:January 31, 2022   Patient has met 9 of 9 long term goals due to improved activity tolerance, improved balance, increased strength, and decreased pain.  Patient to discharge at a wheelchair level Supervision.   Patient's care partner is independent to provide the necessary physical assistance at discharge.  Reasons goals not met: N/A- Two goals were discharged secondary to patient's family installing a ramp and patient only able to ambulate ~10' at this time.   Recommendation:  Patient will benefit from ongoing skilled PT services in home health setting to continue to advance safe functional mobility, address ongoing impairments in decreased global strength, impaired dynamic stability, and minimize fall risk.  Equipment: Manual wheelchair, cushion, RW  Reasons for discharge: treatment goals met and discharge from hospital  Patient/family agrees with progress made and goals achieved: Yes  PT Discharge Precautions/Restrictions Precautions Precautions: Fall Restrictions Weight Bearing Restrictions: No LLE Weight Bearing: Non weight bearing Other Position/Activity Restrictions: L BKA Pain Pain Assessment Pain Scale: 0-10 Pain Score: 4  Pain Type: Acute pain Pain Location: Leg Pain Orientation: Left Pain Descriptors / Indicators: Aching Pain Frequency: Intermittent Pain Onset: On-going Pain Intervention(s): Medication (See eMAR) Pain Interference Pain Interference Pain Effect on Sleep: 1. Rarely or not at all Pain Interference with Therapy Activities: 1. Rarely or not at all Pain Interference with Day-to-Day Activities: 1. Rarely or not at all Vision/Perception  Vision - History Ability to See in Adequate Light: 0 Adequate Perception Perception: Within Functional Limits Praxis Praxis: Intact  Cognition Overall  Cognitive Status: Within Functional Limits for tasks assessed Arousal/Alertness: Awake/alert Orientation Level: Oriented X4 Year: 2023 Month: November Day of Week: Correct Memory: Appears intact Awareness: Appears intact Problem Solving: Appears intact Safety/Judgment: Appears intact Sensation Sensation Light Touch: Appears Intact Proprioception: Appears Intact Coordination Gross Motor Movements are Fluid and Coordinated: Yes Fine Motor Movements are Fluid and Coordinated: Yes Finger Nose Finger Test: [facility] Motor  Motor Motor: Within Functional Limits  Mobility Bed Mobility Bed Mobility: Rolling Right;Rolling Left;Supine to Sit;Sit to Supine Rolling Right: Independent Rolling Left: Independent Supine to Sit: Independent Sit to Supine: Independent Transfers Transfers: Sit to Stand;Stand to Sit;Stand Pivot Transfers Sit to Stand: Independent with assistive device Stand to Sit: Independent with assistive device Stand Pivot Transfers: Independent with assistive device Transfer (Assistive device): Rolling walker Locomotion  Gait Ambulation: Yes Gait Assistance: Supervision/Verbal cueing Gait Distance (Feet): 10 Feet Assistive device: Rolling walker Gait Assistance Details: Verbal cues for technique;Verbal cues for gait pattern Gait Gait: Yes Gait Pattern: Poor foot clearance - right;Decreased step length - right Gait velocity: decreased Stairs / Additional Locomotion Stairs: No Wheelchair Mobility Wheelchair Mobility: Yes Wheelchair Assistance: Independent with Camera operator: Both upper extremities Wheelchair Parts Management: Independent Distance: 150  Trunk/Postural Assessment  Cervical Assessment Cervical Assessment: Within Functional Limits Thoracic Assessment Thoracic Assessment: Within Functional Limits Lumbar Assessment Lumbar Assessment: Within Functional Limits Postural Control Postural Control: Deficits on evaluation 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  Balance Balance Balance Assessed: Yes Static Sitting Balance Static Sitting - Level of Assistance: 6: Modified independent (Device/Increase time) Dynamic Sitting Balance Dynamic Sitting - Balance Support: During functional activity Dynamic Sitting - Level of Assistance: 6: Modified independent (Device/Increase time) Static Standing Balance Static Standing - Balance Support: During functional activity;Bilateral upper extremity supported Static Standing - Level of Assistance: 6: Modified independent (Device/Increase time)  Dynamic Standing Balance Dynamic Standing - Balance Support: During functional activity;Bilateral upper extremity supported Dynamic Standing - Level of Assistance: 5: Stand by assistance Extremity Assessment      RLE Assessment RLE Assessment: Within Functional Limits General Strength Comments: Improving strength and muscular endurance since initial evaluation LLE Assessment LLE Assessment: Exceptions to [facility] General Strength Comments: Improving strength and muscular endurance since initial evaluation- 4/5   Tammy Good 01/30/2022, 8:36 AM

## 2022-01-30 NOTE — Progress Notes (Signed)
Patient had to be cathed x 1 this morning after overflow incontinence. Avoided caths during day by getting on BSC to void, residuals 100-200 see documentation. Evening, patient incontinent in bed again, PVR showed 474. Got patient up to void again on BSC and reduced PVR to 187. Educated patient about getting up to [facility] to void on regular schedule when she goes home to avoid overflow incontinence and retaining urine. Patient verbalized understanding.

## 2022-01-30 NOTE — Progress Notes (Signed)
Recreational Therapy Session Note  Patient Details  Name: Tammy Good MRN: [id number] Date of Birth: 1951/03/14 Today's Date: 01/30/2022  Pain: no c/o Skilled Therapeutic Interventions/Progress Updates:   Session focused on amputee support and limb management, activity tolerance, and dynamic standing balance during co-treat/group with PT.   Goal:  Pt will maintain dynamic standing balance with contact guard assistance for mod complex task.  MET   Pt participated in group discussion about residual limb management & desensitization techniques.  PT provided information on role of limb guard & shrinker.     Transitioned to standing balance activity playing corn hole.  Pt stood with contact guard assist with UE support for corn hole. Pt retrieving bean bag with left hand, transitioning to right hand (so intermittently standing without UE support) for corn hole game.  Pt propelled w/c back to the room using BUEs with supervision..  Therapy/Group: Co-Treatment   Christan Defranco 01/30/2022, 3:44 PM

## 2022-01-30 NOTE — Progress Notes (Signed)
Physical Therapy Session Note  Patient Details  Name: Tammy Good MRN: [id number] Date of Birth: 30-Dec-1950  Today's Date: 01/30/2022 PT Individual Time: 0930-1018 PT Individual Time Calculation (min): 48 min   Short Term Goals: Week 2:  PT Short Term Goal 1 (Week 2): STG=LTG secondary to ELOS  Skilled Therapeutic Interventions/Progress Updates: Pt presented in w/c agreeable to therapy. Pt c/o mild residual limb pain 2/10. Session focused on creation of HEP in preparation for d/c. Pt propelled to day room for general conditioning with supervision overall. Pt was able to manage leg rest and amputee pad mod I. Pt performed hop pivot transfer to high/low mat. Pt then performed follow therex in development of HEP. Pt received handout of HEP via [place] however HEP not carried over to note. Pt then performed hop pivot transfer back to w/c and PTA transported pt back to room for time management. Pt left in w/c at end of session with call bell within reach and needs met.   All therex performed 2 x 10 with instruction to improve to 3 x 10  Seated LAQ AROM Supine: SLR,    SAQ (instructed to use rolled up blanket for bolster)   Single leg bridge Sidelying: hip abd       Hip extension Prone: hip flexor stretch  Hip extension  Hamstring pulls   Pt was able to transfer to all positions mod I      Therapy Documentation Precautions:  Precautions Precautions: Fall Restrictions Weight Bearing Restrictions: No LLE Weight Bearing: Non weight bearing Other Position/Activity Restrictions: L BKA General:   Vital Signs: Therapy Vitals Temp: 97.8 F (36.6 C) Temp Source: Oral Pulse Rate: 70 Resp: 16 BP: (!) 142/52 Patient Position (if appropriate): Lying Oxygen Therapy SpO2: 98 % O2 Device: Room Air Pain:   Mobility:   Locomotion :    Trunk/Postural Assessment :    Balance:   Exercises:   Other Treatments:      Therapy/Group: Individual Therapy  Sayan Aldava 01/30/2022, 4:17 PM

## 2022-01-30 NOTE — Progress Notes (Signed)
Physical Therapy Session Note  Patient Details  Name: Tammy Good MRN: [id number] Date of Birth: 09/02/1950  Today's Date: 01/30/2022 PT Individual Time: 1110-1125 PT Individual Time Calculation (min): 15 min  and Today's Date: 01/30/2022 PT Group Time: 1125-1200 PT Group Time Calculation (min): 35 min  Short Term Goals: Week 2:  PT Short Term Goal 1 (Week 2): STG=LTG secondary to ELOS  Skilled Therapeutic Interventions/Progress Updates:      Therapy Documentation Precautions:  Precautions Precautions: Fall Restrictions Weight Bearing Restrictions: No LLE Weight Bearing: Non weight bearing    Individual Treatment Session:  Pt declines pain at start of session and requires (S) for safety with sit to stand with RW. Pt transitioned to blocked practice of sit to stand transfers with no AD and requires mod A for static standing balance. Pt tolerated standing ~45 seconds and requires tactile and verbal cueing for posture and gluteal activation.    Group Treatment Session:  PT provided education regarding purpose and role of shrinker's and limb guard and how to prepare for prosthesis. Pt educated regarding desensitization techniques for residual limb pain and pt performed with various textured materials. Pt requires CGA for safety with sit to stand for dynamic standing balance with RW and engaged in unilateral tossing of bean bag for target. Pt returned to room by w/c and left with all needs in reach.    Therapy/Group: Individual Therapy  [name] [name] PT, DPT  01/30/2022, 7:57 AM

## 2022-01-30 NOTE — Progress Notes (Signed)
Inpatient Rehabilitation Care Coordinator Discharge Note   Patient Details  Name: Tammy Good MRN: [id number] Date of Birth: 1950/08/27   Discharge location: [place] IN WITH HER BUT DOES HER OT CLINICALS DURING THE DAY AT [place]  Length of Stay: 14 DAYS  Discharge activity level: MOD/I WHEELCHAIR LEVEL AND SUPERVISION SHORT DISTANCES DUE TO KNEE ISSUES  Home/community participation: ACTIVE  Patient response Tammy Good Literacy - How often do you need to have someone help you when you read instructions, pamphlets, or other written material from your doctor or pharmacy?: Never  Patient response Tammy Good Isolation - How often do you feel lonely or isolated from those around you?: Never  Services provided included: MD, PT, RD, OT, RN, CM, TR, Pharmacy, Neuropsych, SW  Financial Services:  Financial Services Utilized: Medicare    Choices offered to/list presented to:    Follow-up services arranged:  Home Health, DME, Patient/Family request agency HH/DME Home Health Agency: [place] PT OT RN AIDE    DME : [facility]-WHEELCHAIR, DROP-ARM BEDSIDE COMMODE HH/DME Requested Agency: HAS HAD [place] PRIOR TO ADMISSION GOT TUB BENCH ON OWN AND HAS OTHER PIECES OF EQUIPMENT NEEDED FROM PREVIOUS ADMISSIONS  Patient response to transportation need: Is the patient able to respond to transportation needs?: Yes In the past 12 months, has lack of transportation kept you from medical appointments or from getting medications?: No In the past 12 months, has lack of transportation kept you from meetings, work, or from getting things needed for daily living?: No    Comments (or additional information):DAUGHTER AND SON IN-LAW WERE HERE FOR EDUCATION PRIOR TO DC HOME AND ALL COMFORTABLE WITH CARE NEEDS. SON GOT RAMP FOR STEPS.  Patient/Family verbalized understanding of follow-up arrangements:  Yes  Individual responsible for coordination of  the follow-up plan: Tammy Good-DAUGHTER [phone]  Confirmed correct DME delivered: Tammy Good 01/30/2022    Tammy Good

## 2022-01-30 NOTE — Telephone Encounter (Signed)
Patient Advocate Encounter   Received notification that prior authorization for Diclofenac Sodium 1% gel is required.   PA submitted on 01/30/2022 Key [id number] Status is pending       Lyndel Safe, [place] Patient Advocate Specialist [place] Patient Advocate Team Direct Number: [phone]  Fax: [phone]

## 2022-01-30 NOTE — Progress Notes (Signed)
Physical Therapy Session Note  Patient Details  Name: Tammy Good MRN: [id number] Date of Birth: 1950/06/24  Today's Date: 01/30/2022 PT Individual Time: 0800-0900 PT Individual Time Calculation (min): 60 min   Short Term Goals: Week 1:  PT Short Term Goal 1 (Week 1): Pt will transfer to and from [facility] with CGA PT Short Term Goal 1 - Progress (Week 1): Met PT Short Term Goal 2 (Week 1): Pt will ambulate 46f with min assist and LRAD PT Short Term Goal 2 - Progress (Week 1): Progressing toward goal PT Short Term Goal 3 (Week 1): pt will perform stair management with use of shower chair PT Short Term Goal 3 - Progress (Week 1): Discontinued (comment) (Family is to install a ramp- Stair mobility goals to be discontinued.) Week 2:  PT Short Term Goal 1 (Week 2): STG=LTG secondary to ELOS  Skilled Therapeutic Interventions/Progress Updates:  Patient greeted supine in bed with RN present and agreeable to PT treatment session. Patient was able to roll L and R independently and transitioned to sitting EOB ModI. While sitting EOB, patient was able to thread pants. Patient performed sit/stand with RW ModI and was able to pull up pants with SBA for safety. Patient then performed stand pivot transfer to the wheelchair with ModI. Patient sat at the sink and washed her face/brushed her teeth independently. Patient then propelled manual wheelchair from her room to ortho gym ModI.   Patient performed car transfer with RW and Supv for safety- Once seated in car simulator, patient was able to independently place B LE into/out of the car.   Patient performed stand pivot transfer to/from wheelchair and mat table with RW ModI. Patient then gait trained x10' with RW and supv for safety- VC for improved posturing throughout gait trial with good effort noted.   Patient ascended/descended a low-grade ramp via wheelchair with B UE and CGA/MinA- Patient required MinA while ascending and CGA/SBA while descending.  Patient has a ramp installed at her home, however her ramp is steep and not safe for her to ascend/descend on her own. Family training scheduled for tomorrow in order to ensure a safe discharge.   Patient requesting to use the UE bike for the last few minutes of treatment session- Patient performed UE bike x6 minutes with B UE on level 2.    Patient returned to her room sitting upright in wheelchair with tray table in front, call bell within reach and all needs met.   Therapy Documentation Precautions:  Precautions Precautions: Fall Restrictions Weight Bearing Restrictions: No LLE Weight Bearing: Non weight bearing  Therapy/Group: Individual Therapy  Oriel Rumbold 01/30/2022, 7:47 AM

## 2022-01-30 NOTE — Discharge Instructions (Addendum)
Inpatient Rehab Discharge Instructions  Tammy Good Discharge date and time: 01/31/22   Activities/Precautions/ Functional Status: Activity: no lifting, driving, or strenuous exercise till cleared by MD  Diet: diabetic diet Wound Care: keep wound clean and dry. Contact Dr. Lucia Gaskins if you develop any problems with your incision/wound--redness, swelling, increase in pain, drainage or if you develop fever or chills.    Functional status:  ___ No restrictions     ___ Walk up steps independently _X__ 24/7 supervision/assistance   ___ Walk up steps with assistance ___ Intermittent supervision/assistance  ___ Bathe/dress independently ___ Walk with walker     __X_ Bathe/dress with assistance ___ Walk Independently    ___ Shower independently ___ Walk with assistance    ___ Shower with assistance _X__ No alcohol     ___ Return to work/school ________  Special Instructions: Monitor blood sugars before meals and at bedtime. Use sliding scale per home regimen and contact Dr. Forde Dandy for input to increase your Basiglar insulin.    COMMUNITY REFERRALS UPON DISCHARGE:    Home Health:   PT  OT  AIDE  RN                Agency: [place] Phone: [phone]    Medical Equipment/Items Ordered:WHEELCHAIR AND DROP-ARM BEDSIDE COMMODE GOT TUB BENCH ON OWN AND HAD ROLLING WALKER                                                 Agency/Supplier: [facility]   [phone]    My questions have been answered and I understand these instructions. I will adhere to these goals and the provided educational materials after my discharge from the hospital.  Patient/Caregiver Signature _______________________________ Date __________  Clinician Signature _______________________________________ Date __________  Please bring this form and your medication list with you to all your follow-up doctor's appointments.

## 2022-01-30 NOTE — Progress Notes (Signed)
Inpatient Rehabilitation Discharge Medication Review by a Pharmacist  A complete drug regimen review was completed for this patient to identify any potential clinically significant medication issues.  High Risk Drug Classes Is patient taking? Indication by Medication  Antipsychotic No   Anticoagulant Yes Eliquis - PAF  Antibiotic No   Opioid No   Antiplatelet Yes Clopidogrel - PAD  Hypoglycemics/insulin Yes Basaglar, Novolog pen- DM Farxiga - DM  Vasoactive Medication Yes Furosemide - prn fluid Midodrine - BP Tamsulosin - retention  Chemotherapy No   Other Yes Atorvastatin - HLD Diclofenac gel - pain Pantoprazole - Reflux Levothyroxine - low thyroid Dexamethasone - immunity     Type of Medication Issue Identified Description of Issue Recommendation(s)  Drug Interaction(s) (clinically significant)     Duplicate Therapy     Allergy     No Medication Administration End Date     Incorrect Dose     Additional Drug Therapy Needed     Significant med changes from prior encounter (inform family/care partners about these prior to discharge).    Other       Clinically significant medication issues were identified that warrant physician communication and completion of prescribed/recommended actions by midnight of the next day:  No  Time spent performing this drug regimen review (minutes):  20 minutes  Thank you Anette Guarneri, PharmD

## 2022-01-31 ENCOUNTER — Other Ambulatory Visit (HOSPITAL_COMMUNITY): Payer: Self-pay

## 2022-01-31 DIAGNOSIS — S88112S Complete traumatic amputation at level between knee and ankle, left lower leg, sequela: Secondary | ICD-10-CM

## 2022-01-31 LAB — GLUCOSE, CAPILLARY: Glucose-Capillary: 81 mg/dL (ref 70–99)

## 2022-01-31 MED ORDER — TAMSULOSIN HCL 0.4 MG PO CAPS
0.4000 mg | ORAL_CAPSULE | Freq: Every day | ORAL | 0 refills | Status: DC
  Filled 2022-01-31: qty 30, 30d supply, fill #0

## 2022-01-31 MED ORDER — MIDODRINE HCL 5 MG PO TABS: 5.0000 mg | ORAL_TABLET | Freq: Two times a day (BID) | ORAL | Status: DC

## 2022-01-31 NOTE — Progress Notes (Signed)
PROGRESS NOTE   Subjective/Complaints: She reports bladder function is improved. She is happy to go home today. She reports R knee pain is improved.   Review of Systems  Constitutional:  Negative for chills and fever.  HENT:  Negative for congestion and hearing loss.   Eyes:  Negative for blurred vision and double vision.  Respiratory:  Negative for cough.   Cardiovascular:  Negative for chest pain and palpitations.  Gastrointestinal:  Negative for abdominal pain, constipation, diarrhea, heartburn, nausea and vomiting.  Genitourinary:  Positive for frequency and urgency. Negative for dysuria.  Neurological:  Negative for sensory change and headaches.  Psychiatric/Behavioral:  Negative for depression.       Objective:   No results found. No results for input(s): "WBC", "HGB", "HCT", "PLT" in the last 72 hours.   No results for input(s): "NA", "K", "CL", "CO2", "GLUCOSE", "BUN", "CREATININE", "CALCIUM" in the last 72 hours.    Intake/Output Summary (Last 24 hours) at 01/31/2022 1004 Last data filed at 01/31/2022 0817 Gross per 24 hour  Intake 720 ml  Output 1416 ml  Net -696 ml         Physical Exam: Vital Signs Blood pressure (!) 139/49, pulse 63, temperature 98.6 F (37 C), temperature source Oral, resp. rate 18, height '5\' 7"'$  (1.702 m), weight 87 kg, SpO2 100 %.   Constitutional: No distress . Vital signs reviewed. HEENT: NCAT, AT, conjugate gaze, MMM Neck: supple Cardiovascular: RRR without murmur. No JVD    Respiratory/Chest: CTA Bilaterally without wheezes or rales.  Good air movement GI/Abdomen: BS +, non-tender, non-distended,soft Ext: no clubbing, cyanosis Psych: pleasant and cooperative  Musc: left BK with decreased edema, sl bulbous at distal end, no signs of infection R knee non-tender to palpation or ROM Skin: warm and dry Neurologic: Cranial nerves II through XII intact, motor strength is 5/5  in bilateral deltoid, bicep, tricep, grip, right hip flexor, knee extensors, ankle dorsiflexor and plantar flexor, 4+/5 left HF      Assessment/Plan: 1. Functional deficits which require 3+ hours per day of interdisciplinary therapy in a comprehensive inpatient rehab setting. Physiatrist is providing close team supervision and 24 hour management of active medical problems listed below. Physiatrist and rehab team continue to assess barriers to discharge/monitor patient progress toward functional and medical goals  Care Tool:  Bathing    Body parts bathed by patient: Left arm, Right arm, Chest, Abdomen, Front perineal area, Left upper leg, Right upper leg, Face, Buttocks, Right lower leg   Body parts bathed by helper: Right lower leg, Buttocks Body parts n/a: Left lower leg   Bathing assist Assist Level: Independent with assistive device     Upper Body Dressing/Undressing Upper body dressing   What is the patient wearing?: Pull over shirt    Upper body assist Assist Level: Independent    Lower Body Dressing/Undressing Lower body dressing      What is the patient wearing?: Underwear/pull up, Pants     Lower body assist Assist for lower body dressing: Independent with assitive device     Toileting Toileting    Toileting assist Assist for toileting: Independent with assistive device     Transfers  Chair/bed transfer  Transfers assist     Chair/bed transfer assist level: Independent with assistive device Chair/bed transfer assistive device: Museum/gallery exhibitions officer assist      Assist level: Supervision/Verbal cueing Assistive device: Walker-rolling Max distance: 10   Walk 10 feet activity   Assist     Assist level: Supervision/Verbal cueing Assistive device: Walker-rolling   Walk 50 feet activity   Assist Walk 50 feet with 2 turns activity did not occur: Safety/medical concerns         Walk 150 feet activity   Assist  Walk 150 feet activity did not occur: Safety/medical concerns         Walk 10 feet on uneven surface  activity   Assist Walk 10 feet on uneven surfaces activity did not occur: Safety/medical concerns         Wheelchair     Assist Is the patient using a wheelchair?: Yes Type of Wheelchair: Manual    Wheelchair assist level: Independent Max wheelchair distance: 150+    Wheelchair 50 feet with 2 turns activity    Assist        Assist Level: Independent   Wheelchair 150 feet activity     Assist      Assist Level: Independent   Blood pressure (!) 139/49, pulse 63, temperature 98.6 F (37 C), temperature source Oral, resp. rate 18, height '5\' 7"'$  (1.702 m), weight 87 kg, SpO2 100 %.    Medical Problem List and Plan: 1. 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 Consult [employer] to provide her more information  -OK for DC home today 2.  Antithrombotics: -DVT/anticoagulation:  Pharmaceutical: Eliquis resumed on 10/27             -antiplatelet therapy: Plavix 3. Pain Management:  hydrocodone prn.  4. Mood/Behavior/Sleep:  LCSW to follow for evaluation and support.              -antipsychotic agents:  N?A 5. Neuropsych/cognition: This patient is capable of making decisions on her own behalf. 6. Skin/Wound Care: Monitor wound for healing.   --Protein supplements and vitamins added to promote healing.  7. Fluids/Electrolytes/Nutrition: Monitor I/O. Check CMET on 10/30 8.  CAD: Monitor for symptoms with increase in activity. Continue ASA, Plavix, Lipitor.  Nitroglycerin PRN. Denies chest pain 9. Hypotension: Monitor BP TID.  Has been stable with midodrine BID.   -11/9 BP has been stable, monitor due to addition of Flomax  -BP stable, continue current medications 10 T2DM: Hgb A1C- 5.4 and well controlled. continue farxiga '10mg'$  daily             --Continue Insulin Glargine will adjust to 22 units from 24 units with SSI for elevated BS.              --11/3 Will increase glargine  back up to 24 units  11/7 few elevated CBGs continue monitor trend  -11/9 CBGs fairly well controlled overall although she has occasional mildly elevated values, a.m. glucose 87 continue glargine at current dose, continue farxiga  -11/10 Fairly well control, continue current medications, will will restart sliding scale at home, has libre device CBG (last 3)  Recent Labs    01/30/22 1701 01/30/22 2127 01/31/22 0627  GLUCAP 151* 174* 81         Latest Ref Rng & Units 01/27/2022    5:48 AM 01/20/2022    5:47 AM 01/17/2022    7:35 AM  CBC  WBC 4.0 - 10.5 K/uL 10.5  8.1  7.6   Hemoglobin 12.0 - 15.0 g/dL 8.5  8.3  8.0   Hematocrit 36.0 - 46.0 % 28.9  28.5  26.6   Platelets 150 - 400 K/uL 575  671  465    .  11. ABLA on anemia of chronic disease: Stable stable in 7.2->8.0 today.  --Monitor for signs of bleeding as Eliquis and Plavix resumed 10/27. If Hgb drops may need to hold plavix --h/o [facility] (Dr. Collene Mares) -Continue ferrous sulfate -11/6 WBC 8.5 12. PAD: Continue Plavix and lipitor 13. Gastroparesis?: Was on reglan PRN PTA.  14. Constipation  -11/10 LBM yesterday, regular Bms, continue miralax daily 15. Hypothyroidism             -Continue synthroid  16. Afib             -Continue Eliquis   -HR controlled  17. Thrombocytosis, likely reactive  -11/7 improved to 575 on labs yesterday  18. Chronic urinary frequency  -Order purwick at night, sleeping better with this device  -11/7 Will check PVR, start oxybutynin '5mg'$  BID, check U/A and culture  -11/8 reports  improved after oxybutynin started, will ask nursing to check PVR  -11/9 elevated bladder scan requiring IC, we will discontinue oxybutynin, will order Flomax, continue bladder scans, urine culture with E. coli and Klebsiella 40,000 and 10,000 CFU suspect this is a contaminant  11/10 Will order fosfomycin, urinary retention improved this AM, recommended f/u with urology for chronic urinary frequency  19. K knee pain, likely OA  -11/1 Start voltaren gel  -improved with voltaren gel, continue to monitor, consider knee injection next week if pain worsens again 20. Thrombocytosis   -likely reactive, improved 11/6  LOS: 14 days A FACE TO [place] 01/31/2022, 10:04 AM

## 2022-01-31 NOTE — Progress Notes (Signed)
Slept very well. Voided X2 this shift. Post void residuals obtained. Anticipating discharge today. Remains stable. Safety maintained at all times.

## 2022-01-31 NOTE — Discharge Summary (Signed)
Physician Discharge Summary  Patient ID: ESHANI MAESTRE MRN: [id number] DOB/AGE: 69-Aug-1952 69 y.o.  Admit date: 01/17/2022 Discharge date: 01/31/2022  Discharge Diagnoses:  Principal Problem:   Unilateral complete BKA, left, initial encounter [facility]) Active Problems:   Diabetes mellitus, type 2 ([place])   Long term (current) use of insulin ([place])   Nocturia more than twice per night   Discharged Condition: stable  Significant Diagnostic Studies: No results found.  Labs:  Basic Metabolic Panel:    Latest Ref Rng & Units 01/27/2022    5:48 AM 01/20/2022    5:47 AM 01/17/2022    7:35 AM  BMP  Glucose 70 - 99 mg/dL 90  96  102   BUN 8 - 23 mg/dL '23  21  13   '$ Creatinine 0.44 - 1.00 mg/dL 0.62  0.59  0.70   Sodium 135 - 145 mmol/L 138  141  140   Potassium 3.5 - 5.1 mmol/L 3.6  3.5  3.9   Chloride 98 - 111 mmol/L 109  107  107   CO2 22 - 32 mmol/L '24  26  27   '$ Calcium 8.9 - 10.3 mg/dL 8.4  8.6  8.4      CBC:    Latest Ref Rng & Units 01/27/2022    5:48 AM 01/20/2022    5:47 AM 01/17/2022    7:35 AM  CBC  WBC 4.0 - 10.5 K/uL 10.5  8.1  7.6   Hemoglobin 12.0 - 15.0 g/dL 8.5  8.3  8.0   Hematocrit 36.0 - 46.0 % 28.9  28.5  26.6   Platelets 150 - 400 K/uL 575  671  465      CBG: Recent Labs  Lab 01/30/22 0632 01/30/22 1150 01/30/22 1701 01/30/22 2127 01/31/22 0627  GLUCAP 87 171* 151* 174* 58    Brief HPI:   Tammy Good is a 71 y.o. female with history of CAD, PAD with critical limb ischemia s/p right left SFT and anterior tibial angioplasty, T2DM, MRSA bacteremia with prolonged rounds of antibiotic for severe septic arthritis with osteomyelitis and had maximized treatment after left BKA with right Dr. Linus Salmons.  She had plans for surgery but was admitted on 01/13/2022 due to downward trend in hemoglobin to 7.0.  She was transfused with 1 units PRBCs, Eliquis and Plavix were placed on hold and she underwent left BKA by Dr. Lucia Gaskins on 10/24.  Postop has had minimal  pain and H&H was stable therefore Eliquis and resumed prior to discharge.  Therapy was working with patient who continued to be limited by pain, fatigue as well as weakness.  CIR was recommended due to functional decline.   Hospital Course: Tammy Good was admitted to rehab 01/17/2022 for inpatient therapies to consist of PT, ST and OT at least three hours five days a week. Past admission physiatrist, therapy team and rehab RN have worked together to provide customized collaborative inpatient rehab.  Protein supplements were added to help promote wound healing.  Her blood pressures were monitored on TID basis and and had been stable with use of midodrine every morning and at noon.  Pain has been reasonably controlled without use of narcotics.  Eliquis was resumed on 10/27 and follow-up CBC shows acute blood loss anemia to be slowly improving and reactive thrombocytosis has resolved.  Her diabetes has been monitored with ac/hs CBG checks and SSI was use prn for tighter BS control.  Insulin glargine was titrated up to 24 units with use  of sliding scale for tighter blood sugar control.  She has had issues with a.m. hypoglycemia therefore insulin glargine was not titrated further.  She was advised to resume her libre at home and follow-up with PCP for further adjustment of insulin after discharge.  MiraLAX was added to help manage constipation.   She has had issues with incontinence due to history of nocturia and limb loss affecting her mobility. Ditropan was added to help manage symptoms at night however she developed urinary retention therefore Ditropan was discontinued.  Flomax was added at bedtime to help with voiding function and she was voiding without signs of retention at discharge.  Left knee pain is improved with addition of Voltaren gel.  Her mood has been stable and she has made steady gains during her rehab stay. She is modified independent at wheelchair level and supervision  when ambulating.  She will continue to receive follow-up home health PT , OT, RN and aide by advanced home care after discharge.     Rehab course: During patient's stay in rehab weekly team conferences were held to monitor patient's progress, set goals and discuss barriers to discharge. At admission, patient required mod assist with ADLs and min assist with mobility. She  has had improvement in activity tolerance, balance, postural control as well as ability to compensate for deficits. She requires ADL tasks.  She is modified independent for toileting and toilet transfers. She is independent for transfers and requires supervision to ambulate 10' with RW. Ambulation is limited due to right  knee pain. Family education has been completed.     Discharge disposition: 01-Home or Self Care  Diet: Heart Healthy/Carb modified  Special Instructions: Cleanse wound with normal saline and apply compressive dressing/ Monitor BS ac/hs and use home regimen of SSI.  Recommend urology follow up for work up of hyperactive/neurogenic  bladder.   Allergies as of 01/31/2022       Reactions   Strawberry Extract Itching, Swelling, Anaphylaxis   Mouth swells and gets itchy        Medication List     STOP taking these medications    BASAGLAR KWIKPEN [place] Replaced by: Basaglar KwikPen 100 UNIT/ML   metoCLOPramide 5 MG tablet Commonly known as: Reglan   traMADol 50 MG tablet Commonly known as: ULTRAM       TAKE these medications    acetaminophen 500 MG tablet Commonly known as: TYLENOL Take 500-1,000 mg by mouth 3 (three) times daily as needed for headache or mild pain.   apixaban 5 MG Tabs tablet Commonly known as: ELIQUIS Take 1 tablet (5 mg total) by mouth 2 (two) times daily.   atorvastatin 40 MG tablet Commonly known as: LIPITOR Take 1 tablet (40 mg total) by mouth daily at 6 PM. What changed: when to take this   B-D UF III MINI PEN NEEDLES 31G X 5 MM Misc Generic drug: Insulin Pen Needle Inject into  the skin.   Basaglar KwikPen 100 UNIT/ML Inject 24 Units into the skin at bedtime. Replaces: BASAGLAR KWIKPEN [place]   clopidogrel 75 MG tablet Commonly known as: PLAVIX TAKE 1 TABLET BY MOUTH DAILY   dapagliflozin propanediol 10 MG Tabs tablet Commonly known as: FARXIGA Take 10 mg by mouth daily.   dexamethasone 0.5 MG tablet Commonly known as: Decadron Take 1 tablet (0.5 mg total) by mouth daily.   diclofenac Sodium 1 % Gel Commonly known as: VOLTAREN Apply 4 g topically 4 (four) times daily.   docusate sodium 100 MG  capsule Commonly known as: COLACE Take 1 capsule (100 mg total) by mouth 2 (two) times daily.   Ensure Max Protein Liqd Take 330 mLs (11 oz total) by mouth 2 (two) times daily. Notes to patient: Use protein supplements or protein snack to help promote wound healing   ferrous sulfate 325 (65 FE) MG tablet Take 325 mg by mouth at bedtime.   FreeStyle Libre 2 Sensor Misc Apply topically every 14 (fourteen) days.   furosemide 20 MG tablet Commonly known as: Lasix Take 1 tablet (20 mg total) by mouth daily as needed for fluid or edema.   levothyroxine 125 MCG tablet Commonly known as: SYNTHROID Take 1 tablet (125 mcg total) by mouth daily before breakfast.   lubriderm seriously sensitive Lotn Apply 1 Application topically 2 (two) times daily. Notes to patient: To dry skin   midodrine 5 MG tablet Commonly known as: PROAMATINE Take 1 tablet (5 mg total) by mouth 2 (two) times daily with breakfast and lunch. What changed: when to take this   nitroGLYCERIN 0.4 MG SL tablet Commonly known as: NITROSTAT Place 0.4 mg under the tongue every 5 (five) minutes as needed for chest pain.   NovoLOG FlexPen 100 UNIT/ML FlexPen Generic drug: insulin aspart Inject 0-7 Units into the skin 3 (three) times daily with meals. Sliding scale insulin   pantoprazole 40 MG tablet Commonly known as: PROTONIX Take 40 mg by mouth daily.   polyethylene glycol powder 17  GM/SCOOP powder Commonly known as: GLYCOLAX/MIRALAX Take 1 capful with water (17 g) by mouth daily. What changed:  when to take this reasons to take this   Systane Complete 0.6 % Soln Generic drug: Propylene Glycol Place 1 drop into both eyes 2 (two) times daily as needed (dry/irritated eyes.).   tamsulosin 0.4 MG Caps capsule Commonly known as: FLOMAX Take 1 capsule (0.4 mg total) by mouth daily.        Follow-up Information     Jennye Boroughs, MD Follow up.   Specialty: Physical Medicine and Rehabilitation Why: office will call you with follow up appointment Contact information: [address] [place] [address] [phone]         Erle Crocker, MD Follow up.   Specialty: Orthopedic Surgery Why: Call in 1-2 days for post hospital follow up Contact information: [place] [address] [phone]         [name], MD Follow up.   Specialty: Endocrinology Why: Call in 1-2 days for post hospital follow up Contact information: [place] [address] [phone]                 Signed: Bary Leriche 02/03/2022, 2:21 AM

## 2022-01-31 NOTE — Progress Notes (Signed)
Recreational Therapy Discharge Summary Patient Details  Name: Tammy Good MRN: [id number] Date of Birth: 1950-10-30 Today's Date: 01/31/2022   Comments on progress toward goals: Pt has made great progress during LOS and is discharging home with family to provide the needed supervision/assistance.  TR sessions focused on activity tolerance, holistic health (social, emotional, spiritual health in addition to physical health), stress management, amputee support.  Pt is extremely motivated to regain further independence and is looking forward to returning to previously enjoyed activities.  Reasons for discharge: discharge from hospital  Follow-up: [place] agrees with progress made and goals achieved: Yes  Tammy Good 01/31/2022, 12:58 PM

## 2022-01-31 NOTE — Progress Notes (Signed)
Physical Therapy Session Note  Patient Details  Name: Tammy Good MRN: [id number] Date of Birth: 22-Mar-1951  Today's Date: 01/31/2022 PT Individual Time: 0900-0930 PT Individual Time Calculation (min): 30 min   Short Term Goals: Week 1:  PT Short Term Goal 1 (Week 1): Pt will transfer to and from [facility] with [facility] PT Short Term Goal 1 - Progress (Week 1): Met PT Short Term Goal 2 (Week 1): Pt will ambulate 92f with min assist and LRAD PT Short Term Goal 2 - Progress (Week 1): Progressing toward goal PT Short Term Goal 3 (Week 1): pt will perform stair management with use of shower chair PT Short Term Goal 3 - Progress (Week 1): Discontinued (comment) (Family is to install a ramp- Stair mobility goals to be discontinued.) Week 2:  PT Short Term Goal 1 (Week 2): STG=LTG secondary to ELOS   Skilled Therapeutic Interventions/Progress Updates:  Patient greeted sitting upright in wheelchair in room and agreeable to PT treatment session- Patient scheduled for family training prior to discharge in order to ensure safety at home, however family not present in room at this time. RN present administering medications while therapist assessed residual limb. Residual limb incisions look dry and intact with no redness and drainage- Therapist re-wrapped residual limb in order to assist with shaping. MD present for morning rounds and prepping for discharge. Therapist and patient discussed further discharge planning, safety awareness, etc in order to ensure a safe discharge home with family. Family arrived for remaining half of treatment session and were brought down to the ortho gym for hands-on family training. Patient performed car transfer with RW ModI with therapist present for demonstration of task and then transferred out of the car with granddaughter present. Family then educated and therapist demonstrated proper way to ascend/descend a steep ramp in order to ensure patient and caregiver safety. Family  educated further on discharge, safety at home, HHPT, wrapping the residual limb, etc. All family's questions answered throughout session. Patient handed-off to OT for continued hands-on family training.    Therapy Documentation Precautions:  Precautions Precautions: Fall Restrictions Weight Bearing Restrictions: No LLE Weight Bearing: Non weight bearing Other Position/Activity Restrictions: L BKA  Therapy/Group: Individual Therapy  Wanisha Shiroma 01/31/2022, 7:55 AM

## 2022-01-31 NOTE — Plan of Care (Signed)
Medication adjusted, PVRs down, did not require I+O catheterization, reinforce toileting with double voiding.

## 2022-02-03 ENCOUNTER — Telehealth: Payer: Self-pay | Admitting: Cardiology

## 2022-02-03 DIAGNOSIS — R351 Nocturia: Secondary | ICD-10-CM | POA: Insufficient documentation

## 2022-02-03 NOTE — Telephone Encounter (Signed)
Patient is requesting to convert 11/27 appointment with Dr. Ellyn Hack to a virtual if at all possible. She states she had a left leg amputation and it is very difficult to get out of the house.

## 2022-02-04 ENCOUNTER — Encounter: Payer: Self-pay | Admitting: Cardiology

## 2022-02-04 NOTE — Telephone Encounter (Signed)
Error

## 2022-02-05 NOTE — Telephone Encounter (Signed)
Would prefer to d Virtual Visits as 1st or last visit of AM or PM -- less interruption of work flow.   I am happy to do virtual - but Probably will move her Visit time to between 12-12:30 time frame.   Keep her on the schedule - just on the day, we will do the actually visit over lunch instead of 8:20.  [place]

## 2022-02-11 NOTE — Telephone Encounter (Signed)
Spoke to patient. Original appointment was changed to 03/28/22.  Appointment was changed to Feb 26, 2022.  to accommodate patient immobility.  RN  informed patient of the process for virtual visit . She verbalized understanding. She will obtain vital signs the day of appointment. She will do virtual visit via computer on [name]

## 2022-02-17 ENCOUNTER — Ambulatory Visit: Payer: Medicare Other | Admitting: Cardiology

## 2022-02-26 ENCOUNTER — Ambulatory Visit: Payer: Medicare Other | Attending: Cardiology | Admitting: Cardiology

## 2022-02-26 ENCOUNTER — Encounter: Payer: Self-pay | Admitting: Cardiology

## 2022-02-26 ENCOUNTER — Other Ambulatory Visit: Payer: Self-pay | Admitting: Cardiology

## 2022-02-26 VITALS — BP 118/60 | Ht 67.0 in

## 2022-02-26 DIAGNOSIS — I214 Non-ST elevation (NSTEMI) myocardial infarction: Secondary | ICD-10-CM

## 2022-02-26 DIAGNOSIS — E785 Hyperlipidemia, unspecified: Secondary | ICD-10-CM

## 2022-02-26 DIAGNOSIS — I10 Essential (primary) hypertension: Secondary | ICD-10-CM

## 2022-02-26 DIAGNOSIS — I2510 Atherosclerotic heart disease of native coronary artery without angina pectoris: Secondary | ICD-10-CM

## 2022-02-26 DIAGNOSIS — I480 Paroxysmal atrial fibrillation: Secondary | ICD-10-CM

## 2022-02-26 DIAGNOSIS — Z9861 Coronary angioplasty status: Secondary | ICD-10-CM

## 2022-02-26 DIAGNOSIS — E1169 Type 2 diabetes mellitus with other specified complication: Secondary | ICD-10-CM

## 2022-02-26 DIAGNOSIS — E669 Obesity, unspecified: Secondary | ICD-10-CM

## 2022-02-26 NOTE — Assessment & Plan Note (Signed)
RCA PCI in 2015 followed by LCx PCI in 2022.  No active cardiac symptoms of angina or dyspnea.  On Plavix as much for CAD has PAD. Okay to hold Plavix 5 days preop for surgeries or procedures.

## 2022-02-26 NOTE — Progress Notes (Signed)
Virtual Visit via Video Note   Because of Tammy Good's co-morbid illnesses, she is at least at moderate risk for complications without adequate follow up.  This format is felt to be most appropriate for this patient at this time.  All issues noted in this document were discussed and addressed.  A limited physical exam was performed with this format.  Please refer to the patient's chart for her consent to telehealth for [facility].      Patient has given verbal permission to conduct this visit via virtual appointment and to bill insurance 02/26/2022 5:21 PM     Evaluation Performed:  Follow-up visit  Date:  02/26/2022   ID:  Tammy Good, DOB 01/01/1951, MRN [id number]  Patient Location: Home Provider Location: Office/Clinic  PCP:  Default, Provider, MD  Cardiologist:  Glenetta Hew, MD  Electrophysiologist:  None   Chief Complaint:   Chief Complaint  Patient presents with   Hospitalization Follow-up    Recent left leg TKA for recurrent osteomyelitis.   Coronary Artery Disease    No angina or heart failure symptoms.    ====================================  ASSESSMENT & PLAN:    Problem List Items Addressed This Visit       Cardiology Problems   Essential hypertension (Chronic)    Has been off her antihypertensive since surgery.  Had been on ACE inhibitor and beta-blocker, not restarted.      Hyperlipidemia associated with type 2 diabetes mellitus ([place]) (Chronic)    11/14/2021: TC 108, TG 91, HDL 35, LDL 55.   01/08/2022 A1c 5.4. Labs not look as good as I did back in April, still pretty well controlled on current meds.  Continue current dose of atorvastatin and Farxiga as well as insulin per Dr. Forde Dandy.  Question if she would benefit from GLP-1 agonist which would help for weight loss.      Atherosclerotic heart disease of native coronary artery without angina pectoris - Primary (Chronic)    Continues to be doing remarkably well without any  active angina symptoms.  She is on stable regimen overall.  Currently not very active because of her recent surgery, but is looking forward to starting back.  Plan: No longer on metoprolol because of hypotension issues.  She is actually on midodrine.  Hold off for now, can reassess in 6 months. Also no longer on ACE inhibitor. On atorvastatin and Farxiga On Plavix monotherapy mostly because of PVD disease.      Atrial fibrillation ([place]) (Chronic)    Had not had any symptoms further to suggest A-fib.  We will need to confirm if she has or is not taking Eliquis.  It has listed as discontinued.      CAD S/P percutaneous coronary angioplasty (Chronic)    RCA PCI in 2015 followed by LCx PCI in 2022.  No active cardiac symptoms of angina or dyspnea.  On Plavix as much for CAD has PAD. Okay to hold Plavix 5 days preop for surgeries or procedures.      Non-ST elevation (NSTEMI) myocardial infarction (HCC) (Chronic)    Over 8 years out from MI.  Mildly reduced EF by echo, no active heart failure symptoms.  No active angina symptoms.  Most recent echo did not comment on wall motion abnormality, but the previous one said there is moderate inferior hypokinesis consistent with MI.        Other   Obesity (BMI 30-39.9) (Chronic)    She lost weight which may or may  not be related to her left leg TKA.  Despite this, her BMI is now down to about 30.  Continue to work on dietary modifications, and hopefully now that she had her surgery she will be to get into doing some more active exercise program.  She will need some time to get used to using a prosthesis however.       ====================================  History of Present Illness:    Tammy Good is a 71 y.o. female with PMH notable for CAD (two-vessel PCI), DM-2, PAD (reviewed below)-most notably s/p recent left BKA for osteomyelitis related to diabetes and PAD, HTN, HLD who presents via video conferencing for a telehealth visit today  as a 72-monthfollow-up. CAD: Inferior STEMI June 2015 (PCI to RCA with PTCA of PDA -> Integrity Resolute 3.0 mmx 38 mm -> 3.35 mm) - PTCA of PAV/PLA EF ~55%, inferior HK with Gr 1 DD PAD: 06/11/2019-L ABI 0.64-TBI 0.35 (s/p L foot 5th ray-partial amputation secondary to osteomyelitis) Abd Ao-gram w/ LEA runoff - 05/26/2019: L SFA orbital atherectomy- DEB PTA & Stent for focal dissection (Luttonix 6.0 x 40 mm) - Patent PopA w/ 3 V Runoff.  LEA Dopplers 01/03/2020: RABI (prev) 0.68 (0.53)/ RTBI (prev) 0.58 (0.33); LABI (prev) 0.80 (0.64), LTBI (prev) 0.64 (0.51); R mSFA ~50-74%, L mSFA 50-74%. 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  Had had l right leg SFA intervention and was feeling better with less claudication.  Felt like her dyspnea was improved now that she was able to walk.  No real chest pain or pressure.  Notably improved exertional dyspnea.  Some radicular pain also noted with trivial swelling.  At that time she was on Lopressor 25 mg twice daily and lisinopril 10 mg daily-which have both been stopped.  She was seen for what 644-monthollow-up by JeCaron PresumePA on May 31 (is seen KaJory SimsNP in November 2022).  This is actually a hospital follow-up after being admitted in April for osteomyelitis of the left midfoot and ankle.  She had wound debridement and left SFA intervention.  Makari standpoint she is feeling okay but was concerned about her wound healing.  Tolerating Eliquis and Plavix.  No angina or heart failure symptoms.  No recurrence of A-fib.  Lipids well controlled.  Plan was 6-61-monthllow-up.  She was then seen by JesColetta MemosP on 01/09/2022 via telemedicine for preop assessment for left BKA.-With no active symptoms, was "cleared "for surgery.  The session of limitations were in her legs she was able to do more than 4  METS.  Felt to be Class III Risk-6.6% chance.  Was able to hold Eliquis and Plavix.  Hospitalizations:  01/14/2022-left BKA Postop went to inpatient rehab   Recent - Interim CV studies:   The following studies were reviewed today: No recent studies:  Inerval History   Marylan S ForCzerwinski being seen today via video conferencing because of her inability to get her out and about very easily following her left leg BKA.  She is still waiting to get her shrinker which will then let her get measured for her prosthesis.  Hoping that before Christmas she can get her prosthesis.  Unfortunately until then she is not able to walk.  However from a overall symptom standpoint she feels remarkably better after having had  her surgery.  The removal of the ongoing infection has led to her having much better energy level.  She is been sleeping better with exception of nocturia.  She thinks she could actually sleep through the night without having nocturia she would be much happier.  She just says that her overall demeanor is improved she does not have the constant reminder of the infection with the smell and discomfort.  She said that the foot was really limiting her ability to do anything because although her plans are made around the limitations she had because of that.  Now she is just waiting to get her mobility back with the prosthetic.  She has a great outlook how she was to go for.  She has been doing all the exercises that they have asked her to do while in the wheelchair and is doing fine.  She is very happy overall.  The only major symptom she notes is frequent nocturia and is hoping that urologist can help her with this.  Cardiac wise she is completely asymptomatic.  Her weight is down, probably because of not having part of the leg now but is hoping that she can increase level activity once she gets her prosthesis and then really lose weight.  She is watching her diet now.  Her lipids and glycemic control  been great with the assistance of Dr. Forde Dandy and his team. She denies any chest pain or pressure with the level activity as she is able to do.  No exertional dyspnea.  No PND orthopnea and trivial edema.  No real symptoms of palpitations or irregular heartbeats.  No syncope or near syncope.  No TIA or amaurosis fugax.  ROS:  Please see the history of present illness.     Review of Systems  Constitutional:  Positive for weight loss (Probably loss of part of her leg.). Negative for malaise/fatigue (Energy level has notably improved since having had her surgery..  The removal of ongoing infection seems to have had a remarkable effect.).  HENT:  Negative for congestion and nosebleeds.   Respiratory:  Positive for shortness of breath (Stable but notably improved.). Negative for cough.   Cardiovascular:  Negative for chest pain, palpitations and leg swelling.  Gastrointestinal:  Negative for abdominal pain, blood in stool and melena.  Genitourinary:  Negative for hematuria.  Musculoskeletal:  Negative for joint pain.       Does not sound like she has had no have any notable phantom limb symptoms which is quite reassuring.  Neurological:  Negative for dizziness and focal weakness.  Psychiatric/Behavioral:  Negative for depression (In remarkably good mood since having had surgery.  His handling the concept of losing part of her leg as a positive and not a.). The patient is not nervous/anxious and does not have insomnia (Not insomnia, just not able to sleep tonight because of frequent nocturia.).     Past Medical History:  Diagnosis Date   CAD S/P percutaneous coronary angioplasty 08/2013   100% mRCA - PCI Integrity Resolute DES 3.0 mm x 38 mm - 3.35 mm; PTCA of RPA V 2.0 mm x 15 mm   CHF (congestive heart failure) (HCC)    Cholesteatoma    right   Diabetes mellitus type 2 in obese (HCC)    On insulin and Invokana   History of osteomyelitis L 5th Toe all 05/2019   s/p Partial Ray Amputation with  partial closure; 6 wks Abx & LSFA Atherectomy/DEB PTA with Stent for focal dissection.  Hyperlipidemia with target LDL less than 70    Hypothyroidism (acquired)    Mild essential hypertension    Obesity (BMI 30-39.9) 11/17/2013   PAD (peripheral artery disease) ([place]) 05/26/2019   05/26/19: Abd AoGram- BLE runoff -> L SFA orbital atherectomy - PTA w/ DEB & Stent 6 x 40 Luttonix (for focal dissection) - patent Pop A with 3 V runoff. LEA Dopplers 01/03/2020: RABI (prev) 0.68 (0.53)/ RTBI (prev) 0.58 (0.33); LABI (prev) 0.80 (0.64), LTBI (prev) 0.64 (0.51); R mSFA ~50-74%, L mSFA 50-74%. Patent Prox SFA stent < 49% stenosis   Panhypopituitarism (HCC)    ST elevation myocardial infarction (STEMI) of inferior wall, subsequent episode of care ([place]) 08/2013   80% branch of D1, 40% mid AV groove circumflex, 100% RCA with subacute thrombus -- thrombus extending into RPA V with 100% occlusion after initial angioplasty of mid RCA ;; Post MI ECHO 6/9/'15: EF 50-55%, mild LVH with moderate HK of inferior wall, Gr1 DD, mild LA dilation; mildly reduced RV function   Past Surgical History:  Procedure Laterality Date   ABDOMINAL AORTOGRAM W/LOWER EXTREMITY N/A 05/26/2019   Procedure: ABDOMINAL AORTOGRAM W/LOWER EXTREMITY;  Surgeon: Marty Heck, MD;  Location: [place] [facility];  Service: Cardiovascular;  Laterality: N/A;   ABDOMINAL AORTOGRAM W/LOWER EXTREMITY N/A 11/15/2020   Procedure: ABDOMINAL AORTOGRAM W/LOWER EXTREMITY;  Surgeon: Marty Heck, MD;  Location: [place] [facility];  Service: Cardiovascular;  Laterality: N/A;   ABDOMINAL AORTOGRAM W/LOWER EXTREMITY N/A 04/18/2021   Procedure: ABDOMINAL AORTOGRAM W/LOWER EXTREMITY;  Surgeon: Marty Heck, MD;  Location: [place] [facility];  Service: Cardiovascular;  Laterality: N/A;   ABDOMINAL AORTOGRAM W/LOWER EXTREMITY Left 07/11/2021   Procedure: ABDOMINAL AORTOGRAM W/LOWER EXTREMITY;  Surgeon: Marty Heck, MD;  Location: [place] [facility];  Service: Cardiovascular;  Laterality: Left;   AMPUTATION Left 05/25/2019   Procedure: AMPUTATION RAY 5th;  Surgeon: Trula Slade, DPM;  Location: [place];  Service: Podiatry;  Laterality: Left;   AMPUTATION Left 01/14/2022   Procedure: LEFT BELOW THE KNEE AMPUTATION;  Surgeon: Erle Crocker, MD;  Location: [place];  Service: Orthopedics;  Laterality: Left;  LENGTH OF SURGERY: 90 MINUTES   APPLICATION OF WOUND VAC Left 01/14/2022   Procedure: WOUND VAC PLACEMENT;  Surgeon: Erle Crocker, MD;  Location: [place];  Service: Orthopedics;  Laterality: Left;   BONE BIOPSY Left 04/24/2021   Procedure: BONE BIOPSY;  Surgeon: Landis Martins, DPM;  Location: [place];  Service: Podiatry;  Laterality: Left;  left ankle/fibula   BONE BIOPSY Left 07/15/2021   Procedure: LEFT FOOT BONE BIOPSY;  Surgeon: Trula Slade, DPM;  Location: [place];  Service: Podiatry;  Laterality: Left;   Cardiac Event Monitor  July-August 2015   Sinus rhythm with PVCs   CHOLECYSTECTOMY     COLONOSCOPY N/A 08/31/2013   Procedure: COLONOSCOPY;  Surgeon: Juanita Craver, MD;  Location: [facility] ENDOSCOPY;  Service: Endoscopy;  Laterality: N/A;   CORONARY STENT INTERVENTION N/A 11/19/2020   Procedure: CORONARY STENT INTERVENTION;  Surgeon: Nelva Bush, MD;  Location: [place] [facility];  Service: Cardiovascular;  Laterality: N/A;   ESOPHAGOGASTRODUODENOSCOPY N/A 09/01/2013   Procedure: ESOPHAGOGASTRODUODENOSCOPY (EGD);  Surgeon: Beryle Beams, MD;  Location: [facility] ENDOSCOPY;  Service: Endoscopy;  Laterality: N/A;  bedside   ESOPHAGOGASTRODUODENOSCOPY (EGD) WITH PROPOFOL N/A 05/26/2021   Procedure: ESOPHAGOGASTRODUODENOSCOPY (EGD) WITH PROPOFOL;  Surgeon: Carol Ada, MD;  Location: [place];  Service: Gastroenterology;  Laterality: N/A;   EYE  SURGERY Bilateral    bilateral cataracts   INCISION AND DRAINAGE Left 04/24/2021   Procedure: INCISION AND DRAINAGE;  Surgeon: Landis Martins, DPM;  Location:  [place];  Service: Podiatry;  Laterality: Left;   IRRIGATION AND DEBRIDEMENT FOOT Left 07/15/2021   Procedure: IRRIGATION AND DEBRIDEMENT FOOT;  Surgeon: Trula Slade, DPM;  Location: [place];  Service: Podiatry;  Laterality: Left;   LEFT HEART CATH AND CORONARY ANGIOGRAPHY N/A 11/19/2020   Procedure: LEFT HEART CATH AND CORONARY ANGIOGRAPHY;  Surgeon: Nelva Bush, MD;  Location: [place] [facility];  Service: Cardiovascular;  Laterality: N/A;   LEFT HEART CATHETERIZATION WITH CORONARY ANGIOGRAM N/A 08/30/2013   Procedure: LEFT HEART CATHETERIZATION WITH CORONARY ANGIOGRAM;  Surgeon: Leonie Man, MD;  Location: [facility] CATH LAB: 100% mRCA (thrombus - extends to RPAV), 80% D1, 40% AVG Cx.   PERCUTANEOUS CORONARY STENT INTERVENTION (PCI-S)  08/30/2013   Procedure: PERCUTANEOUS CORONARY STENT INTERVENTION (PCI-S);  Surgeon: Leonie Man, MD;  Location: [facility] CATH LAB;  Integrity Resolute DES 2.0 mm x 38 mm -- 3.35 mm.; PTCA of proximal RPA V. - 3.0 mm x 15 mm balloon   PERIPHERAL VASCULAR ATHERECTOMY  05/26/2019   Procedure: PERIPHERAL VASCULAR ATHERECTOMY;  Surgeon: Marty Heck, MD;  Location: [place] [facility];  Service: Cardiovascular;;  Left SFA   PERIPHERAL VASCULAR BALLOON ANGIOPLASTY Left 07/11/2021   Procedure: PERIPHERAL VASCULAR BALLOON ANGIOPLASTY;  Surgeon: Marty Heck, MD;  Location: [place] [facility];  Service: Cardiovascular;  Laterality: Left;  PT TRUNK / AT   PERIPHERAL VASCULAR INTERVENTION  05/26/2019   Procedure: PERIPHERAL VASCULAR INTERVENTION;  Surgeon: Marty Heck, MD;  Location: [place] [facility];  Service: Cardiovascular;;  Left SFA   PERIPHERAL VASCULAR INTERVENTION Right 11/15/2020   Procedure: PERIPHERAL VASCULAR INTERVENTION;  Surgeon: Marty Heck, MD;  Location: [place] [facility];  Service: Cardiovascular;  Laterality: Right;  Superficial Femoral Artery   PERIPHERAL VASCULAR INTERVENTION Left 07/11/2021   Procedure: PERIPHERAL  VASCULAR INTERVENTION;  Surgeon: Marty Heck, MD;  Location: [place] [facility];  Service: Cardiovascular;  Laterality: Left;  SFA   PITUITARY SURGERY     TEE WITHOUT CARDIOVERSION N/A 04/26/2021   Procedure: TRANSESOPHAGEAL ECHOCARDIOGRAM (TEE);  Surgeon: Buford Dresser, MD;  Location: [facility] ENDOSCOPY;  Service: Cardiovascular;  Laterality: N/A;   TRANSTHORACIC ECHOCARDIOGRAM  08/30/2013   mild LVH. EF 50-55%. Moderate HK of the entire inferior myocardium. GR 1 DD. Mild LA dilation. Mildly reduced RV function   TYMPANOMASTOIDECTOMY Right 12/28/2017   Procedure: RIGHT TYMPANOMASTOIDECTOMY;  Surgeon: Leta Baptist, MD;  Location: [place];  Service: ENT;  Laterality: Right;      Diagnostic Dominance: Right      Intervention  Echo 04/24/2021: EF 40 to 45%.  Mildly decreased function.  Mild concentric LVH.  Unable to assess diastolic parameters.  Unable to exclude vegetation. TEE 04/26/2021: EF 40 to 45%.  Global HK.  Mild LA dilation.  No LAA thrombus.  Normal mitral and aortic valves.  No stenosis.  Descending aorta has GRII plaque.  No evidence of infectious endocarditis.  Current Meds  Medication Sig   acetaminophen (TYLENOL) 500 MG tablet Take 500-1,000 mg by mouth 3 (three) times daily as needed for headache or mild pain.   atorvastatin (LIPITOR) 40 MG tablet Take 1 tablet (40 mg total) by mouth daily at 6 PM. (Patient taking differently: Take 40 mg by mouth daily.)   B-D UF III MINI PEN NEEDLES 31G  X 5 MM MISC Inject into the skin.   clopidogrel (PLAVIX) 75 MG tablet TAKE 1 TABLET BY MOUTH DAILY   Continuous Blood Gluc Sensor (FREESTYLE LIBRE 2 SENSOR) MISC Apply topically every 14 (fourteen) days.   dapagliflozin propanediol (FARXIGA) 10 MG TABS tablet Take 10 mg by mouth daily.   dexamethasone (DECADRON) 0.5 MG tablet Take 1 tablet (0.5 mg total) by mouth daily.   diclofenac Sodium (VOLTAREN) 1 % GEL Apply 4 g topically 4 (four) times daily.   docusate sodium  (COLACE) 100 MG capsule Take 1 capsule (100 mg total) by mouth 2 (two) times daily.   Emollient (LUBRIDERM SERIOUSLY SENSITIVE) LOTN Apply 1 Application topically 2 (two) times daily.   Ensure Max Protein (ENSURE MAX PROTEIN) LIQD Take 330 mLs (11 oz total) by mouth 2 (two) times daily.   ferrous sulfate 325 (65 FE) MG tablet Take 325 mg by mouth at bedtime.   furosemide (LASIX) 20 MG tablet Take 1 tablet (20 mg total) by mouth daily as needed for fluid or edema.   insulin aspart (NOVOLOG FLEXPEN) 100 UNIT/ML FlexPen Inject 0-7 Units into the skin 3 (three) times daily with meals. Sliding scale insulin   Insulin Glargine (BASAGLAR KWIKPEN) 100 UNIT/ML Inject 24 Units into the skin at bedtime. (Patient taking differently: Inject 18 Units into the skin at bedtime.)   levothyroxine (SYNTHROID) 125 MCG tablet Take 1 tablet (125 mcg total) by mouth daily before breakfast.   midodrine (PROAMATINE) 5 MG tablet Take 1 tablet (5 mg total) by mouth 2 (two) times daily with breakfast and lunch.   nitroGLYCERIN (NITROSTAT) 0.4 MG SL tablet Place 0.4 mg under the tongue every 5 (five) minutes as needed for chest pain.   pantoprazole (PROTONIX) 40 MG tablet Take 40 mg by mouth daily.   polyethylene glycol powder (GLYCOLAX/MIRALAX) 17 GM/SCOOP powder Take 1 capful with water (17 g) by mouth daily. (Patient taking differently: Take 17 g by mouth daily as needed (constipation.).)   Propylene Glycol (SYSTANE COMPLETE) 0.6 % SOLN Place 1 drop into both eyes 2 (two) times daily as needed (dry/irritated eyes.).   tamsulosin (FLOMAX) 0.4 MG CAPS capsule Take 1 capsule (0.4 mg total) by mouth daily.   [DISCONTINUED] apixaban (ELIQUIS) 5 MG TABS tablet Take 1 tablet (5 mg total) by mouth 2 (two) times daily.     CHA2DS2-VASc score 6-CHF, HTN, diabetes, vascular disease, age, female  Allergies:   Strawberry extract   Social History   Tobacco Use   Smoking status: Former   Smokeless tobacco: Never  Brewing technologist Use: Never used  Substance Use Topics   Alcohol use: No   Drug use: No     Family Hx: The patient's family history includes Alzheimer's disease in her maternal grandmother; Cancer in her sister; Cancer (age of onset: 69) in her mother; Heart attack (age of onset: 80) in her father.   Labs/Other Tests and Data Reviewed:    EKG:  n/a  Recent Labs: 07/07/2021: B Natriuretic Peptide 74.0 07/15/2021: Magnesium 1.8 01/20/2022: ALT 14 01/27/2022: BUN 23; Creatinine, Ser 0.62; Hemoglobin 8.5; Platelets 575; Potassium 3.6; Sodium 138   Recent Lipid Panel Lab Results  Component Value Date/Time   CHOL 80 07/12/2021 01:18 AM   TRIG 106 07/12/2021 01:18 AM   HDL 22 (L) 07/12/2021 01:18 AM   CHOLHDL 3.6 07/12/2021 01:18 AM   LDLCALC 37 07/12/2021 01:18 AM   11/14/2021: TC 108, TG 91, HDL 35, LDL 55.  01/08/2022 A1c 5.4.  Wt Readings from Last 3 Encounters:  01/30/22 191 lb 12.8 oz (87 kg)  01/08/22 190 lb 6.4 oz (86.4 kg)  12/18/21 204 lb (92.5 kg)     Objective:    Vital Signs:  BP 118/60   Ht '5\' 7"'$  (1.702 m)   SpO2 99%   BMI 30.04 kg/m   VITAL SIGNS:  reviewed GEN:  no acute distress RESPIRATORY:  normal respiratory effort, symmetric expansion NEURO:  alert and oriented x 3, no obvious focal deficit PSYCH:  normal affect   ==========================================  COVID-19 Education: The signs and symptoms of COVID-19 were discussed with the patient and how to seek care for testing (follow up with PCP or arrange E-visit).   The importance of social distancing was discussed today.  Time:   Today, I have spent 24 minutes with the patient with telehealth technology discussing the above problems.   An additional 8 minutes spent charting (reviewing prior notes, hospital records, studies, labs etc.) Total 32 minutes   Medication Adjustments/Labs and Tests Ordered: Current medicines are reviewed at length with the patient today.  Concerns regarding medicines are  outlined above.   Patient Instructions  Medication Instructions:   No medication changes.  Continue medications for blood sugars and cholesterol prescribed by Dr. Forde Dandy.  Need to make sure that you have been started back on Eliquis =>if cleared by surgery.  Monitor blood pressures closely, if pressures start to go up high, then we may need to think about getting back on your blood pressure medicines.  *If you need a refill on your cardiac medications before your next appointment, please call your pharmacy*   Lab Work: Labs are usually checked by Dr. Forde Dandy. If you have labs (blood work) drawn today and your tests are completely normal, you will receive your results only by: [place] (if you have MyChart) OR A paper copy in the mail If you have any lab test that is abnormal or we need to change your treatment, we will call you to review the results.   Testing/Procedures: N/A   Follow-Up: At [facility], you and your health needs are our priority.  As part of our continuing mission to provide you with exceptional heart care, we have created designated Provider Care Teams.  These Care Teams include your primary Cardiologist (physician) and Advanced Practice Providers (APPs -  Physician Assistants and Nurse Practitioners) who all work together to provide you with the care you need, when you need it.  We recommend signing up for the patient portal called "MyChart".  Sign up information is provided on this After Visit Summary.  MyChart is used to connect with patients for Virtual Visits (Telemedicine).  Patients are able to view lab/test results, encounter notes, upcoming appointments, etc.  Non-urgent messages can be sent to your provider as well.   To learn more about what you can do with MyChart, go to [email or web address].    Your next appointment:   6 month(s)  The format for your next appointment:   In Person  Provider:   Glenetta Hew, MD    Other  Instructions Continue the positive outlook.  Keep trying to stay active and exercising once you get the prosthesis.  Try to work on continued weight loss.  Very happy to hear how well you are doing!!  Important Information About Sugar         Signed, Glenetta Hew, MD  02/26/2022 5:21 PM    [facility]

## 2022-02-26 NOTE — Assessment & Plan Note (Signed)
11/14/2021: TC 108, TG 91, HDL 35, LDL 55.   01/08/2022 A1c 5.4. Labs not look as good as I did back in April, still pretty well controlled on current meds.  Continue current dose of atorvastatin and Farxiga as well as insulin per Dr. Forde Dandy.  Question if she would benefit from GLP-1 agonist which would help for weight loss.

## 2022-02-26 NOTE — Telephone Encounter (Signed)
Prescription refill request for Eliquis received. Indication: AF Last office visit: 01/09/22  Thomasene Mohair NP Scr: 0.62 on 01/27/22 Age: 71 Weight: 92.5kg  Based on above findings Eliquis '5mg'$  twice daily is the appropriate dose.  Refill approved.

## 2022-02-26 NOTE — Assessment & Plan Note (Signed)
Had not had any symptoms further to suggest A-fib.  We will need to confirm if she has or is not taking Eliquis.  It has listed as discontinued.

## 2022-02-26 NOTE — Assessment & Plan Note (Signed)
Has been off her antihypertensive since surgery.  Had been on ACE inhibitor and beta-blocker, not restarted.

## 2022-02-26 NOTE — Assessment & Plan Note (Signed)
Over 8 years out from MI.  Mildly reduced EF by echo, no active heart failure symptoms.  No active angina symptoms.  Most recent echo did not comment on wall motion abnormality, but the previous one said there is moderate inferior hypokinesis consistent with MI.

## 2022-02-26 NOTE — Assessment & Plan Note (Signed)
She lost weight which may or may not be related to her left leg TKA.  Despite this, her BMI is now down to about 30.  Continue to work on dietary modifications, and hopefully now that she had her surgery she will be to get into doing some more active exercise program.  She will need some time to get used to using a prosthesis however.

## 2022-02-26 NOTE — Assessment & Plan Note (Signed)
Continues to be doing remarkably well without any active angina symptoms.  She is on stable regimen overall.  Currently not very active because of her recent surgery, but is looking forward to starting back.  Plan: No longer on metoprolol because of hypotension issues.  She is actually on midodrine.  Hold off for now, can reassess in 6 months. Also no longer on ACE inhibitor. On atorvastatin and Farxiga On Plavix monotherapy mostly because of PVD disease.

## 2022-02-26 NOTE — Patient Instructions (Addendum)
Medication Instructions:   No medication changes.  Continue medications for blood sugars and cholesterol prescribed by Dr. Forde Dandy.  Need to make sure that you have been started back on Eliquis =>if cleared by surgery.  Monitor blood pressures closely, if pressures start to go up high, then we may need to think about getting back on your blood pressure medicines.  *If you need a refill on your cardiac medications before your next appointment, please call your pharmacy*   Lab Work: Labs are usually checked by Dr. Forde Dandy. If you have labs (blood work) drawn today and your tests are completely normal, you will receive your results only by: [facility] (if you have MyChart) OR A paper copy in the mail If you have any lab test that is abnormal or we need to change your treatment, we will call you to review the results.   Testing/Procedures: N/A   Follow-Up: At [facility], you and your health needs are our priority.  As part of our continuing mission to provide you with exceptional heart care, we have created designated Provider Care Teams.  These Care Teams include your primary Cardiologist (physician) and Advanced Practice Providers (APPs -  Physician Assistants and Nurse Practitioners) who all work together to provide you with the care you need, when you need it.  We recommend signing up for the patient portal called "MyChart".  Sign up information is provided on this After Visit Summary.  MyChart is used to connect with patients for Virtual Visits (Telemedicine).  Patients are able to view lab/test results, encounter notes, upcoming appointments, etc.  Non-urgent messages can be sent to your provider as well.   To learn more about what you can do with MyChart, go to [email or web address].    Your next appointment:   6 month(s)  The format for your next appointment:   In Person  Provider:   Glenetta Hew, MD    Other Instructions Continue the positive outlook.  Keep  trying to stay active and exercising once you get the prosthesis.  Try to work on continued weight loss.  Very happy to hear how well you are doing!!  Important Information About Sugar

## 2022-03-04 ENCOUNTER — Encounter (HOSPITAL_COMMUNITY): Payer: Medicare Other

## 2022-03-04 ENCOUNTER — Ambulatory Visit: Payer: Medicare Other

## 2022-03-04 DIAGNOSIS — I959 Hypotension, unspecified: Secondary | ICD-10-CM

## 2022-03-04 DIAGNOSIS — Z794 Long term (current) use of insulin: Secondary | ICD-10-CM

## 2022-03-04 DIAGNOSIS — E785 Hyperlipidemia, unspecified: Secondary | ICD-10-CM | POA: Diagnosis not present

## 2022-03-04 DIAGNOSIS — I5022 Chronic systolic (congestive) heart failure: Secondary | ICD-10-CM

## 2022-03-04 DIAGNOSIS — K5900 Constipation, unspecified: Secondary | ICD-10-CM

## 2022-03-04 DIAGNOSIS — I4891 Unspecified atrial fibrillation: Secondary | ICD-10-CM

## 2022-03-04 DIAGNOSIS — E039 Hypothyroidism, unspecified: Secondary | ICD-10-CM

## 2022-03-04 DIAGNOSIS — Z7902 Long term (current) use of antithrombotics/antiplatelets: Secondary | ICD-10-CM

## 2022-03-04 DIAGNOSIS — Z4781 Encounter for orthopedic aftercare following surgical amputation: Secondary | ICD-10-CM | POA: Diagnosis not present

## 2022-03-04 DIAGNOSIS — Z8614 Personal history of Methicillin resistant Staphylococcus aureus infection: Secondary | ICD-10-CM

## 2022-03-04 DIAGNOSIS — M1712 Unilateral primary osteoarthritis, left knee: Secondary | ICD-10-CM

## 2022-03-04 DIAGNOSIS — K760 Fatty (change of) liver, not elsewhere classified: Secondary | ICD-10-CM

## 2022-03-04 DIAGNOSIS — I252 Old myocardial infarction: Secondary | ICD-10-CM

## 2022-03-04 DIAGNOSIS — R350 Frequency of micturition: Secondary | ICD-10-CM

## 2022-03-04 DIAGNOSIS — I2510 Atherosclerotic heart disease of native coronary artery without angina pectoris: Secondary | ICD-10-CM

## 2022-03-04 DIAGNOSIS — R351 Nocturia: Secondary | ICD-10-CM

## 2022-03-04 DIAGNOSIS — E1169 Type 2 diabetes mellitus with other specified complication: Secondary | ICD-10-CM | POA: Diagnosis not present

## 2022-03-04 DIAGNOSIS — Z9181 History of falling: Secondary | ICD-10-CM

## 2022-03-04 DIAGNOSIS — Z89512 Acquired absence of left leg below knee: Secondary | ICD-10-CM

## 2022-03-04 DIAGNOSIS — I110 Hypertensive heart disease with heart failure: Secondary | ICD-10-CM

## 2022-03-04 DIAGNOSIS — Z7901 Long term (current) use of anticoagulants: Secondary | ICD-10-CM

## 2022-03-04 DIAGNOSIS — Z955 Presence of coronary angioplasty implant and graft: Secondary | ICD-10-CM

## 2022-03-04 DIAGNOSIS — E1151 Type 2 diabetes mellitus with diabetic peripheral angiopathy without gangrene: Secondary | ICD-10-CM | POA: Diagnosis not present

## 2022-03-04 DIAGNOSIS — D500 Iron deficiency anemia secondary to blood loss (chronic): Secondary | ICD-10-CM

## 2022-03-06 ENCOUNTER — Inpatient Hospital Stay: Payer: Medicare Other | Admitting: Physical Medicine & Rehabilitation

## 2022-03-07 DIAGNOSIS — K760 Fatty (change of) liver, not elsewhere classified: Secondary | ICD-10-CM

## 2022-03-07 DIAGNOSIS — D500 Iron deficiency anemia secondary to blood loss (chronic): Secondary | ICD-10-CM

## 2022-03-07 DIAGNOSIS — K5900 Constipation, unspecified: Secondary | ICD-10-CM

## 2022-03-07 DIAGNOSIS — I959 Hypotension, unspecified: Secondary | ICD-10-CM

## 2022-03-07 DIAGNOSIS — I4891 Unspecified atrial fibrillation: Secondary | ICD-10-CM

## 2022-03-07 DIAGNOSIS — E785 Hyperlipidemia, unspecified: Secondary | ICD-10-CM | POA: Diagnosis not present

## 2022-03-07 DIAGNOSIS — I5022 Chronic systolic (congestive) heart failure: Secondary | ICD-10-CM

## 2022-03-07 DIAGNOSIS — I2510 Atherosclerotic heart disease of native coronary artery without angina pectoris: Secondary | ICD-10-CM

## 2022-03-07 DIAGNOSIS — E1151 Type 2 diabetes mellitus with diabetic peripheral angiopathy without gangrene: Secondary | ICD-10-CM | POA: Diagnosis not present

## 2022-03-07 DIAGNOSIS — I110 Hypertensive heart disease with heart failure: Secondary | ICD-10-CM

## 2022-03-07 DIAGNOSIS — E039 Hypothyroidism, unspecified: Secondary | ICD-10-CM

## 2022-03-07 DIAGNOSIS — I252 Old myocardial infarction: Secondary | ICD-10-CM

## 2022-03-07 DIAGNOSIS — E1169 Type 2 diabetes mellitus with other specified complication: Secondary | ICD-10-CM | POA: Diagnosis not present

## 2022-03-07 DIAGNOSIS — M1712 Unilateral primary osteoarthritis, left knee: Secondary | ICD-10-CM

## 2022-03-07 DIAGNOSIS — Z4781 Encounter for orthopedic aftercare following surgical amputation: Secondary | ICD-10-CM | POA: Diagnosis not present

## 2022-03-11 ENCOUNTER — Inpatient Hospital Stay: Payer: Medicare Other | Admitting: Physical Medicine & Rehabilitation

## 2022-03-28 ENCOUNTER — Ambulatory Visit: Payer: Medicare Other | Admitting: General Practice

## 2022-03-31 ENCOUNTER — Encounter: Payer: Medicare Other | Admitting: Physical Medicine & Rehabilitation

## 2022-04-04 ENCOUNTER — Ambulatory Visit (INDEPENDENT_AMBULATORY_CARE_PROVIDER_SITE_OTHER)
Admission: RE | Admit: 2022-04-04 | Discharge: 2022-04-04 | Disposition: A | Discharge status: Home / Self Care | Payer: Medicare Other | Source: Ambulatory Visit | Attending: Vascular Surgery | Admitting: Vascular Surgery

## 2022-04-04 ENCOUNTER — Ambulatory Visit (INDEPENDENT_AMBULATORY_CARE_PROVIDER_SITE_OTHER): Payer: Medicare Other | Admitting: Physician Assistant

## 2022-04-04 ENCOUNTER — Ambulatory Visit (HOSPITAL_COMMUNITY)
Admission: RE | Admit: 2022-04-04 | Discharge: 2022-04-04 | Disposition: A | Discharge status: Home / Self Care | Payer: Medicare Other | Source: Ambulatory Visit | Attending: Vascular Surgery | Admitting: Vascular Surgery

## 2022-04-04 VITALS — BP 131/67 | HR 66 | Temp 98.0°F | Resp 20 | Ht 67.0 in

## 2022-04-04 DIAGNOSIS — I739 Peripheral vascular disease, unspecified: Secondary | ICD-10-CM | POA: Insufficient documentation

## 2022-04-04 NOTE — Progress Notes (Signed)
Office Note     CC:  follow up Requesting Provider:  [name], MD  HPI: Tammy Good is a 72 y.o. (March 17, 1951) female who presents for surveillance of PAD.  She underwent right SFA stenting in August 2022 for right foot wound which has since healed.  She underwent numerous endovascular procedures for left lower extremity however unfortunately underwent left below the knee amputation by Dr. Lucia Gaskins in October of last year.  She denies any rest pain or tissue loss of the right foot.  She is on Eliquis and statin daily.  She denies tobacco use.   Past Medical History:  Diagnosis Date   CAD S/P percutaneous coronary angioplasty 08/2013   100% mRCA - PCI Integrity Resolute DES 3.0 mm x 38 mm - 3.35 mm; PTCA of RPA V 2.0 mm x 15 mm   CHF (congestive heart failure) (HCC)    Cholesteatoma    right   Diabetes mellitus type 2 in obese (HCC)    On insulin and Invokana   History of osteomyelitis L 5th Toe all 05/2019   s/p Partial Ray Amputation with partial closure; 6 wks Abx & LSFA Atherectomy/DEB PTA with Stent for focal dissection.   Hyperlipidemia with target LDL less than 70    Hypothyroidism (acquired)    Mild essential hypertension    Obesity (BMI 30-39.9) 11/17/2013   PAD (peripheral artery disease) ([place]) 05/26/2019   05/26/19: Abd AoGram- BLE runoff -> L SFA orbital atherectomy - PTA w/ DEB & Stent 6 x 40 Luttonix (for focal dissection) - patent Pop A with 3 V runoff. LEA Dopplers 01/03/2020: RABI (prev) 0.68 (0.53)/ RTBI (prev) 0.58 (0.33); LABI (prev) 0.80 (0.64), LTBI (prev) 0.64 (0.51); R mSFA ~50-74%, L mSFA 50-74%. Patent Prox SFA stent < 49% stenosis   Panhypopituitarism (HCC)    ST elevation myocardial infarction (STEMI) of inferior wall, subsequent episode of care ([place]) 08/2013   80% branch of D1, 40% mid AV groove circumflex, 100% RCA with subacute thrombus -- thrombus extending into RPA V with 100% occlusion after initial angioplasty of mid RCA ;; Post MI ECHO 6/9/'15: EF  50-55%, mild LVH with moderate HK of inferior wall, Gr1 DD, mild LA dilation; mildly reduced RV function    Past Surgical History:  Procedure Laterality Date   ABDOMINAL AORTOGRAM W/LOWER EXTREMITY N/A 05/26/2019   Procedure: ABDOMINAL AORTOGRAM W/LOWER EXTREMITY;  Surgeon: Marty Heck, MD;  Location: [place] [facility];  Service: Cardiovascular;  Laterality: N/A;   ABDOMINAL AORTOGRAM W/LOWER EXTREMITY N/A 11/15/2020   Procedure: ABDOMINAL AORTOGRAM W/LOWER EXTREMITY;  Surgeon: Marty Heck, MD;  Location: [place] [facility];  Service: Cardiovascular;  Laterality: N/A;   ABDOMINAL AORTOGRAM W/LOWER EXTREMITY N/A 04/18/2021   Procedure: ABDOMINAL AORTOGRAM W/LOWER EXTREMITY;  Surgeon: Marty Heck, MD;  Location: [place] [facility];  Service: Cardiovascular;  Laterality: N/A;   ABDOMINAL AORTOGRAM W/LOWER EXTREMITY Left 07/11/2021   Procedure: ABDOMINAL AORTOGRAM W/LOWER EXTREMITY;  Surgeon: Marty Heck, MD;  Location: [place] [facility];  Service: Cardiovascular;  Laterality: Left;   AMPUTATION Left 05/25/2019   Procedure: AMPUTATION RAY 5th;  Surgeon: Trula Slade, DPM;  Location: [place];  Service: Podiatry;  Laterality: Left;   AMPUTATION Left 01/14/2022   Procedure: LEFT BELOW THE KNEE AMPUTATION;  Surgeon: Erle Crocker, MD;  Location: [place];  Service: Orthopedics;  Laterality: Left;  LENGTH OF SURGERY: 90 MINUTES   APPLICATION OF WOUND VAC Left 01/14/2022   Procedure: WOUND VAC  PLACEMENT;  Surgeon: Erle Crocker, MD;  Location: [place];  Service: Orthopedics;  Laterality: Left;   BONE BIOPSY Left 04/24/2021   Procedure: BONE BIOPSY;  Surgeon: Landis Martins, DPM;  Location: [place];  Service: Podiatry;  Laterality: Left;  left ankle/fibula   BONE BIOPSY Left 07/15/2021   Procedure: LEFT FOOT BONE BIOPSY;  Surgeon: Trula Slade, DPM;  Location: [place];  Service: Podiatry;  Laterality: Left;   Cardiac Event Monitor  July-August 2015    Sinus rhythm with PVCs   CHOLECYSTECTOMY     COLONOSCOPY N/A 08/31/2013   Procedure: COLONOSCOPY;  Surgeon: Juanita Craver, MD;  Location: [facility];  Service: Endoscopy;  Laterality: N/A;   CORONARY STENT INTERVENTION N/A 11/19/2020   Procedure: CORONARY STENT INTERVENTION;  Surgeon: Nelva Bush, MD;  Location: [place] [facility];  Service: Cardiovascular;  Laterality: N/A;   ESOPHAGOGASTRODUODENOSCOPY N/A 09/01/2013   Procedure: ESOPHAGOGASTRODUODENOSCOPY (EGD);  Surgeon: Beryle Beams, MD;  Location: [facility];  Service: Endoscopy;  Laterality: N/A;  bedside   ESOPHAGOGASTRODUODENOSCOPY (EGD) WITH PROPOFOL N/A 05/26/2021   Procedure: ESOPHAGOGASTRODUODENOSCOPY (EGD) WITH PROPOFOL;  Surgeon: Carol Ada, MD;  Location: [place];  Service: Gastroenterology;  Laterality: N/A;   EYE SURGERY Bilateral    bilateral cataracts   INCISION AND DRAINAGE Left 04/24/2021   Procedure: INCISION AND DRAINAGE;  Surgeon: Landis Martins, DPM;  Location: [place];  Service: Podiatry;  Laterality: Left;   IRRIGATION AND DEBRIDEMENT FOOT Left 07/15/2021   Procedure: IRRIGATION AND DEBRIDEMENT FOOT;  Surgeon: Trula Slade, DPM;  Location: [place];  Service: Podiatry;  Laterality: Left;   LEFT HEART CATH AND CORONARY ANGIOGRAPHY N/A 11/19/2020   Procedure: LEFT HEART CATH AND CORONARY ANGIOGRAPHY;  Surgeon: Nelva Bush, MD;  Location: [place] [facility];  Service: Cardiovascular;  Laterality: N/A;   LEFT HEART CATHETERIZATION WITH CORONARY ANGIOGRAM N/A 08/30/2013   Procedure: LEFT HEART CATHETERIZATION WITH CORONARY ANGIOGRAM;  Surgeon: Leonie Man, MD;  Location: [facility] CATH LAB: 100% mRCA (thrombus - extends to RPAV), 80% D1, 40% AVG Cx.   PERCUTANEOUS CORONARY STENT INTERVENTION (PCI-S)  08/30/2013   Procedure: PERCUTANEOUS CORONARY STENT INTERVENTION (PCI-S);  Surgeon: Leonie Man, MD;  Location: [facility] CATH LAB;  Integrity Resolute DES 2.0 mm x 38 mm -- 3.35 mm.; PTCA of proximal RPA V. -  3.0 mm x 15 mm balloon   PERIPHERAL VASCULAR ATHERECTOMY  05/26/2019   Procedure: PERIPHERAL VASCULAR ATHERECTOMY;  Surgeon: Marty Heck, MD;  Location: [place] [facility];  Service: Cardiovascular;;  Left SFA   PERIPHERAL VASCULAR BALLOON ANGIOPLASTY Left 07/11/2021   Procedure: PERIPHERAL VASCULAR BALLOON ANGIOPLASTY;  Surgeon: Marty Heck, MD;  Location: [place] [facility];  Service: Cardiovascular;  Laterality: Left;  PT TRUNK / AT   PERIPHERAL VASCULAR INTERVENTION  05/26/2019   Procedure: PERIPHERAL VASCULAR INTERVENTION;  Surgeon: Marty Heck, MD;  Location: [place] [facility];  Service: Cardiovascular;;  Left SFA   PERIPHERAL VASCULAR INTERVENTION Right 11/15/2020   Procedure: PERIPHERAL VASCULAR INTERVENTION;  Surgeon: Marty Heck, MD;  Location: [place] [facility];  Service: Cardiovascular;  Laterality: Right;  Superficial Femoral Artery   PERIPHERAL VASCULAR INTERVENTION Left 07/11/2021   Procedure: PERIPHERAL VASCULAR INTERVENTION;  Surgeon: Marty Heck, MD;  Location: [place] [facility];  Service: Cardiovascular;  Laterality: Left;  SFA   PITUITARY SURGERY     TEE WITHOUT CARDIOVERSION N/A 04/26/2021   Procedure: TRANSESOPHAGEAL ECHOCARDIOGRAM (TEE);  Surgeon: Buford Dresser, MD;  Location: [facility] ENDOSCOPY;  Service: Cardiovascular;  Laterality: N/A;   TRANSTHORACIC ECHOCARDIOGRAM  08/30/2013   mild LVH. EF 50-55%. Moderate HK of the entire inferior myocardium. GR 1 DD. Mild LA dilation. Mildly reduced RV function   TYMPANOMASTOIDECTOMY Right 12/28/2017   Procedure: RIGHT TYMPANOMASTOIDECTOMY;  Surgeon: Leta Baptist, MD;  Location: [place];  Service: ENT;  Laterality: Right;    Social History   Socioeconomic History   Marital status: Widowed    Spouse name: Not on file   Number of children: 1   Years of education: Not on file   Highest education level: Not on file  Occupational History   Not on file  Tobacco  Use   Smoking status: Former    Passive exposure: Never   Smokeless tobacco: Never  Vaping Use   Vaping Use: Never used  Substance and Sexual Activity   Alcohol use: No   Drug use: No   Sexual activity: Not Currently    Birth control/protection: Post-menopausal  Other Topics Concern   Not on file  Social History Narrative   Widow. Works at [employer].   Former smoker.   Overall not very active.  Hoping to get into water aerobics class.   Social Determinants of Health   Financial Resource Strain: Not on file  Food Insecurity: Not on file  Transportation Needs: Not on file  Physical Activity: Not on file  Stress: Not on file  Social Connections: Not on file  Intimate Partner Violence: Not on file    Family History  Problem Relation Age of Onset   Cancer Mother 34       multiple myeloma   Heart attack Father 7   Cancer Sister    Alzheimer's disease Maternal Grandmother     Current Outpatient Medications  Medication Sig Dispense Refill   acetaminophen (TYLENOL) 500 MG tablet Take 500-1,000 mg by mouth 3 (three) times daily as needed for headache or mild pain.     apixaban (ELIQUIS) 5 MG TABS tablet TAKE 1 TABLET BY MOUTH TWICE A DAY 60 tablet 5   atorvastatin (LIPITOR) 40 MG tablet Take 1 tablet (40 mg total) by mouth daily at 6 PM. (Patient taking differently: Take 40 mg by mouth daily.) 30 tablet 0   B-D UF III MINI PEN NEEDLES 31G X 5 MM MISC Inject into the skin.     clopidogrel (PLAVIX) 75 MG tablet TAKE 1 TABLET BY MOUTH DAILY 30 tablet 3   Continuous Blood Gluc Sensor (FREESTYLE LIBRE 2 SENSOR) MISC Apply topically every 14 (fourteen) days.     dapagliflozin propanediol (FARXIGA) 10 MG TABS tablet Take 10 mg by mouth daily.     dexamethasone (DECADRON) 0.5 MG tablet Take 1 tablet (0.5 mg total) by mouth daily.     diclofenac Sodium (VOLTAREN) 1 % GEL Apply 4 g topically 4 (four) times daily. 400 g 0   docusate sodium (COLACE) 100 MG capsule Take 1 capsule  (100 mg total) by mouth 2 (two) times daily. 60 capsule 0   Emollient (LUBRIDERM SERIOUSLY SENSITIVE) LOTN Apply 1 Application topically 2 (two) times daily.  0   Ensure Max Protein (ENSURE MAX PROTEIN) LIQD Take 330 mLs (11 oz total) by mouth 2 (two) times daily.     ferrous sulfate 325 (65 FE) MG tablet Take 325 mg by mouth at bedtime.     furosemide (LASIX) 20 MG tablet Take 1 tablet (20 mg total) by mouth daily as needed  for fluid or edema. 30 tablet 0   insulin aspart (NOVOLOG FLEXPEN) 100 UNIT/ML FlexPen Inject 0-7 Units into the skin 3 (three) times daily with meals. Sliding scale insulin     Insulin Glargine (BASAGLAR KWIKPEN) 100 UNIT/ML Inject 24 Units into the skin at bedtime. (Patient taking differently: Inject 18 Units into the skin at bedtime.)     levothyroxine (SYNTHROID) 125 MCG tablet Take 1 tablet (125 mcg total) by mouth daily before breakfast. 30 tablet 2   midodrine (PROAMATINE) 5 MG tablet Take 1 tablet (5 mg total) by mouth 2 (two) times daily with breakfast and lunch.     nitroGLYCERIN (NITROSTAT) 0.4 MG SL tablet Place 0.4 mg under the tongue every 5 (five) minutes as needed for chest pain.     pantoprazole (PROTONIX) 40 MG tablet Take 40 mg by mouth daily.     polyethylene glycol powder (GLYCOLAX/MIRALAX) 17 GM/SCOOP powder Take 1 capful with water (17 g) by mouth daily. (Patient taking differently: Take 17 g by mouth daily as needed (constipation.).) 238 g 0   Propylene Glycol (SYSTANE COMPLETE) 0.6 % SOLN Place 1 drop into both eyes 2 (two) times daily as needed (dry/irritated eyes.).     tamsulosin (FLOMAX) 0.4 MG CAPS capsule Take 1 capsule (0.4 mg total) by mouth daily. 30 capsule 0   No current facility-administered medications for this visit.    Allergies  Allergen Reactions   Strawberry Extract Itching, Swelling and Anaphylaxis    Mouth swells and gets itchy     REVIEW OF SYSTEMS:   '[X]'$  denotes positive finding, '[ ]'$  denotes negative finding Cardiac   Comments:  Chest pain or chest pressure:    Shortness of breath upon exertion:    Short of breath when lying flat:    Irregular heart rhythm:        Vascular    Pain in calf, thigh, or hip brought on by ambulation:    Pain in feet at night that wakes you up from your sleep:     Blood clot in your veins:    Leg swelling:         Pulmonary    Oxygen at home:    Productive cough:     Wheezing:         Neurologic    Sudden weakness in arms or legs:     Sudden numbness in arms or legs:     Sudden onset of difficulty speaking or slurred speech:    Temporary loss of vision in one eye:     Problems with dizziness:         Gastrointestinal    Blood in stool:     Vomited blood:         Genitourinary    Burning when urinating:     Blood in urine:        Psychiatric    Major depression:         Hematologic    Bleeding problems:    Problems with blood clotting too easily:        Skin    Rashes or ulcers:        Constitutional    Fever or chills:      PHYSICAL EXAMINATION:  Vitals:   04/04/22 1343  BP: 131/67  Pulse: 66  Resp: 20  Temp: 98 F (36.7 C)  TempSrc: Temporal  SpO2: 100%  Height: '5\' 7"'$  (1.702 m)    General:  WDWN in NAD; vital signs documented  above Gait: Not observed HENT: WNL, normocephalic Pulmonary: normal non-labored breathing , without Rales, rhonchi,  wheezing Cardiac: regular HR Abdomen: soft, NT, no masses Skin: without rashes Vascular Exam/Pulses: palpable R DP  Extremities: without ischemic changes, without Gangrene , without cellulitis; without open wounds;  Musculoskeletal: no muscle wasting or atrophy  Neurologic: A&O X 3;  No focal weakness or paresthesias are detected Psychiatric:  The pt has Normal affect.   Non-Invasive Vascular Imaging:   Right SFA to popliteal stent patent  ABI/TBIToday's ABIToday's TBIPrevious ABIPrevious TBI  +-------+-----------+-----------+------------+------------+  Right 0.87       0.58        0.97        0.57          +-------+-----------+-----------+------------+------------+  Left  BKA        BKA        0.88        0.60             ASSESSMENT/PLAN:: 73 y.o. female here for surveillance of PAD  -Right foot well-perfused with palpable DP pulse.  Arterial duplex demonstrates a patent SFA to popliteal stent of the right leg.  She is on Eliquis and statin daily.  We will recheck right leg arterial duplex and ABI in 1 year.  She will continue to see her podiatrist regularly for foot checks.  She is in the process of learning to walk with a prosthetic for her left leg.  She knows to call/return office sooner if she develops any rest pain or tissue loss of the right foot   Dagoberto Ligas, PA-C [facility] [phone]  Clinic MD:   Trula Slade

## 2022-04-05 LAB — VAS US ABI WITH/WO TBI: Right ABI: 0.87

## 2022-04-09 ENCOUNTER — Other Ambulatory Visit: Payer: Self-pay | Admitting: Cardiology

## 2022-04-21 ENCOUNTER — Ambulatory Visit (INDEPENDENT_AMBULATORY_CARE_PROVIDER_SITE_OTHER): Payer: Medicare Other | Admitting: Physical Therapy

## 2022-04-21 ENCOUNTER — Other Ambulatory Visit: Payer: Self-pay

## 2022-04-21 ENCOUNTER — Encounter: Payer: Self-pay | Admitting: Physical Therapy

## 2022-04-21 DIAGNOSIS — R2681 Unsteadiness on feet: Secondary | ICD-10-CM

## 2022-04-21 DIAGNOSIS — R42 Dizziness and giddiness: Secondary | ICD-10-CM

## 2022-04-21 DIAGNOSIS — M79662 Pain in left lower leg: Secondary | ICD-10-CM | POA: Diagnosis not present

## 2022-04-21 DIAGNOSIS — R2689 Other abnormalities of gait and mobility: Secondary | ICD-10-CM

## 2022-04-21 DIAGNOSIS — M6281 Muscle weakness (generalized): Secondary | ICD-10-CM

## 2022-04-21 NOTE — Therapy (Signed)
OUTPATIENT PHYSICAL THERAPY PROSTHETICS EVALUATION   Patient Name: Tammy Good MRN: [id number] DOB:[birth year], 72 y.o., female Today's Date: 04/21/2022  PCP: general provider REFERRING PROVIDER: Melony Overly, MD  END OF SESSION:  PT End of Session - 04/21/22 1553     Visit Number 1    Number of Visits 25    Date for PT Re-Evaluation 07/17/22    Authorization Type Medicare & BCBS state health PPO    Progress Note Due on Visit 10    PT Start Time 1504    PT Stop Time 1550    PT Time Calculation (min) 46 min    Equipment Utilized During Treatment Gait belt    Activity Tolerance Patient tolerated treatment well;Patient limited by pain    Behavior During Therapy WFL for tasks assessed/performed             Past Medical History:  Diagnosis Date   CAD S/P percutaneous coronary angioplasty 08/2013   100% mRCA - PCI Integrity Resolute DES 3.0 mm x 38 mm - 3.35 mm; PTCA of RPA V 2.0 mm x 15 mm   CHF (congestive heart failure) ([place])    Cholesteatoma    right   Diabetes mellitus type 2 in obese (HCC)    On insulin and Invokana   History of osteomyelitis L 5th Toe all 05/2019   s/p Partial Ray Amputation with partial closure; 6 wks Abx & LSFA Atherectomy/DEB PTA with Stent for focal dissection.   Hyperlipidemia with target LDL less than 70    Hypothyroidism (acquired)    Mild essential hypertension    Obesity (BMI 30-39.9) 11/17/2013   PAD (peripheral artery disease) ([place]) 05/26/2019   05/26/19: Abd AoGram- BLE runoff -> L SFA orbital atherectomy - PTA w/ DEB & Stent 6 x 40 Luttonix (for focal dissection) - patent Pop A with 3 V runoff. LEA Dopplers 01/03/2020: RABI (prev) 0.68 (0.53)/ RTBI (prev) 0.58 (0.33); LABI (prev) 0.80 (0.64), LTBI (prev) 0.64 (0.51); R mSFA ~50-74%, L mSFA 50-74%. Patent Prox SFA stent < 49% stenosis   Panhypopituitarism (HCC)    ST elevation myocardial infarction (STEMI) of inferior wall, subsequent episode of care ([place]) 08/2013   80% branch  of D1, 40% mid AV groove circumflex, 100% RCA with subacute thrombus -- thrombus extending into RPA V with 100% occlusion after initial angioplasty of mid RCA ;; Post MI ECHO 6/9/'15: EF 50-55%, mild LVH with moderate HK of inferior wall, Gr1 DD, mild LA dilation; mildly reduced RV function   Past Surgical History:  Procedure Laterality Date   ABDOMINAL AORTOGRAM W/LOWER EXTREMITY N/A 05/26/2019   Procedure: ABDOMINAL AORTOGRAM W/LOWER EXTREMITY;  Surgeon: Marty Heck, MD;  Location: [facility];  Service: Cardiovascular;  Laterality: N/A;   ABDOMINAL AORTOGRAM W/LOWER EXTREMITY N/A 11/15/2020   Procedure: ABDOMINAL AORTOGRAM W/LOWER EXTREMITY;  Surgeon: Marty Heck, MD;  Location: [facility];  Service: Cardiovascular;  Laterality: N/A;   ABDOMINAL AORTOGRAM W/LOWER EXTREMITY N/A 04/18/2021   Procedure: ABDOMINAL AORTOGRAM W/LOWER EXTREMITY;  Surgeon: Marty Heck, MD;  Location: [facility];  Service: Cardiovascular;  Laterality: N/A;   ABDOMINAL AORTOGRAM W/LOWER EXTREMITY Left 07/11/2021   Procedure: ABDOMINAL AORTOGRAM W/LOWER EXTREMITY;  Surgeon: Marty Heck, MD;  Location: [facility];  Service: Cardiovascular;  Laterality: Left;   AMPUTATION Left 05/25/2019   Procedure: AMPUTATION RAY 5th;  Surgeon: Trula Slade, DPM;  Location: [facility];  Service: Podiatry;  Laterality: Left;  AMPUTATION Left 01/14/2022   Procedure: LEFT BELOW THE KNEE AMPUTATION;  Surgeon: Erle Crocker, MD;  Location: [facility];  Service: Orthopedics;  Laterality: Left;  LENGTH OF SURGERY: 90 MINUTES   APPLICATION OF WOUND VAC Left 01/14/2022   Procedure: WOUND VAC PLACEMENT;  Surgeon: Erle Crocker, MD;  Location: [facility];  Service: Orthopedics;  Laterality: Left;   BONE BIOPSY Left 04/24/2021   Procedure: BONE BIOPSY;  Surgeon: Landis Martins, DPM;  Location: [facility];  Service: Podiatry;  Laterality: Left;  left ankle/fibula   BONE BIOPSY Left  07/15/2021   Procedure: LEFT FOOT BONE BIOPSY;  Surgeon: Trula Slade, DPM;  Location: [place];  Service: Podiatry;  Laterality: Left;   Cardiac Event Monitor  July-August 2015   Sinus rhythm with PVCs   CHOLECYSTECTOMY     COLONOSCOPY N/A 08/31/2013   Procedure: COLONOSCOPY;  Surgeon: Juanita Craver, MD;  Location: [facility] ENDOSCOPY;  Service: Endoscopy;  Laterality: N/A;   CORONARY STENT INTERVENTION N/A 11/19/2020   Procedure: CORONARY STENT INTERVENTION;  Surgeon: Nelva Bush, MD;  Location: [facility];  Service: Cardiovascular;  Laterality: N/A;   ESOPHAGOGASTRODUODENOSCOPY N/A 09/01/2013   Procedure: ESOPHAGOGASTRODUODENOSCOPY (EGD);  Surgeon: Beryle Beams, MD;  Location: [facility] ENDOSCOPY;  Service: Endoscopy;  Laterality: N/A;  bedside   ESOPHAGOGASTRODUODENOSCOPY (EGD) WITH PROPOFOL N/A 05/26/2021   Procedure: ESOPHAGOGASTRODUODENOSCOPY (EGD) WITH PROPOFOL;  Surgeon: Carol Ada, MD;  Location: [facility];  Service: Gastroenterology;  Laterality: N/A;   EYE SURGERY Bilateral    bilateral cataracts   INCISION AND DRAINAGE Left 04/24/2021   Procedure: INCISION AND DRAINAGE;  Surgeon: Landis Martins, DPM;  Location: [place];  Service: Podiatry;  Laterality: Left;   IRRIGATION AND DEBRIDEMENT FOOT Left 07/15/2021   Procedure: IRRIGATION AND DEBRIDEMENT FOOT;  Surgeon: Trula Slade, DPM;  Location: [place];  Service: Podiatry;  Laterality: Left;   LEFT HEART CATH AND CORONARY ANGIOGRAPHY N/A 11/19/2020   Procedure: LEFT HEART CATH AND CORONARY ANGIOGRAPHY;  Surgeon: Nelva Bush, MD;  Location: [facility];  Service: Cardiovascular;  Laterality: N/A;   LEFT HEART CATHETERIZATION WITH CORONARY ANGIOGRAM N/A 08/30/2013   Procedure: LEFT HEART CATHETERIZATION WITH CORONARY ANGIOGRAM;  Surgeon: Leonie Man, MD;  Location: [facility] CATH LAB: 100% mRCA (thrombus - extends to RPAV), 80% D1, 40% AVG Cx.   PERCUTANEOUS CORONARY STENT INTERVENTION (PCI-S)  08/30/2013    Procedure: PERCUTANEOUS CORONARY STENT INTERVENTION (PCI-S);  Surgeon: Leonie Man, MD;  Location: [facility] CATH LAB;  Integrity Resolute DES 2.0 mm x 38 mm -- 3.35 mm.; PTCA of proximal RPA V. - 3.0 mm x 15 mm balloon   PERIPHERAL VASCULAR ATHERECTOMY  05/26/2019   Procedure: PERIPHERAL VASCULAR ATHERECTOMY;  Surgeon: Marty Heck, MD;  Location: [facility];  Service: Cardiovascular;;  Left SFA   PERIPHERAL VASCULAR BALLOON ANGIOPLASTY Left 07/11/2021   Procedure: PERIPHERAL VASCULAR BALLOON ANGIOPLASTY;  Surgeon: Marty Heck, MD;  Location: [facility];  Service: Cardiovascular;  Laterality: Left;  PT TRUNK / AT   PERIPHERAL VASCULAR INTERVENTION  05/26/2019   Procedure: PERIPHERAL VASCULAR INTERVENTION;  Surgeon: Marty Heck, MD;  Location: [place] [facility];  Service: Cardiovascular;;  Left SFA   PERIPHERAL VASCULAR INTERVENTION Right 11/15/2020   Procedure: PERIPHERAL VASCULAR INTERVENTION;  Surgeon: Marty Heck, MD;  Location: [place] [facility];  Service: Cardiovascular;  Laterality: Right;  Superficial Femoral Artery   PERIPHERAL VASCULAR INTERVENTION Left 07/11/2021   Procedure: PERIPHERAL VASCULAR  INTERVENTION;  Surgeon: Marty Heck, MD;  Location: [place] [facility];  Service: Cardiovascular;  Laterality: Left;  SFA   PITUITARY SURGERY     TEE WITHOUT CARDIOVERSION N/A 04/26/2021   Procedure: TRANSESOPHAGEAL ECHOCARDIOGRAM (TEE);  Surgeon: Buford Dresser, MD;  Location: [facility] ENDOSCOPY;  Service: Cardiovascular;  Laterality: N/A;   TRANSTHORACIC ECHOCARDIOGRAM  08/30/2013   mild LVH. EF 50-55%. Moderate HK of the entire inferior myocardium. GR 1 DD. Mild LA dilation. Mildly reduced RV function   TYMPANOMASTOIDECTOMY Right 12/28/2017   Procedure: RIGHT TYMPANOMASTOIDECTOMY;  Surgeon: Leta Baptist, MD;  Location: [place];  Service: ENT;  Laterality: Right;   Patient Active Problem List   Diagnosis Date Noted    Nocturia more than twice per night 02/03/2022   Unilateral complete BKA, left, initial encounter ([place]) 01/17/2022   Osteomyelitis ([place]) 01/13/2022   Normocytic anemia 01/13/2022   Atrial fibrillation ([place]) 08/15/2021   Septic arthritis of foot ([place]) 08/02/2021   Medication monitoring encounter 08/02/2021   Acute on chronic systolic CHF (congestive heart failure) ([place]) 06/02/2021   Hypothyroidism 06/02/2021   Ulcer of esophagus without bleeding    Pressure injury of skin 05/31/2021   Hypokalemia 05/30/2021   Symptomatic anemia 05/29/2021   AKI (acute kidney injury) (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   PAD (peripheral artery disease) ([place]) 05/26/2019   Osteomyelitis of left foot ([place]) 05/24/2019   Cellulitis and abscess of foot, except toes 05/18/2019   Chronic osteomyelitis of ankle and foot ([place]) 05/18/2019   Cholesteatoma 02/24/2019   Cellulitis and abscess of toe 11/09/2018   Colon cancer screening 11/30/2017   Long term (current) use of insulin ([place]) 03/05/2017   Iron deficiency anemia 12/10/2014   Obesity (BMI 30-39.9) 11/17/2013   Diabetes mellitus, type 2 ([place])    Essential hypertension    Right thigh pain 11/01/2013   PVC's (premature ventricular contractions) 11/01/2013   Status post insertion of drug eluting coronary artery stent to [facility] emergently and PTCA to prox. PLA 09/16/2013   Hyperlipidemia associated with type 2 diabetes mellitus ([place]) 09/16/2013   Presence of coronary angioplasty  implant and graft 09/08/2013   Panhypopituitarism ([place]) 08/30/2013   Non-ST elevation (NSTEMI) myocardial infarction ([place]) 08/22/2013   CAD S/P percutaneous coronary angioplasty 08/22/2013    ONSET DATE:  03/19/2022 prosthesis delivery  REFERRING DIAG: G95.621 (ICD-10-CM) - Hx of BKA, left   THERAPY DIAG:  Other abnormalities of gait and mobility  Unsteadiness on feet  Pain in left lower leg  Muscle weakness (generalized)  Dizziness and giddiness  Rationale for Evaluation and Treatment: Rehabilitation  SUBJECTIVE:   SUBJECTIVE STATEMENT: Patient underwent left Transtibial Amputation on 01/14/2022. She received 03/19/2022 from [facility] working with Brooke Pace, [facility]. HHPT finished up.  She wears prosthesis from arising 6:00 until bed time 11:00.  She removes for 1-2 hours midday.   PERTINENT HISTORY: CAD, STEMI, CHF, PAD, A-Fib, IDDM2, severe septic arthritis  PAIN:  Are you having pain? Yes: NPRS scale:  today 8/10 and in last week  6/10-10/10 Pain location: left distal tibia, posterior knee & distal limb.  Pain description: sore Aggravating factors: walking & standing.  Relieving factors: taking prosthesis off  Back pain lumbar: 0/10 today   If walks >50-75' her back flares up.   PRECAUTIONS: Fall  WEIGHT BEARING RESTRICTIONS: No  FALLS: Has patient fallen in last 6 months? No  LIVING ENVIRONMENT: Lives with: lives alone and 54yo granddaughter is in/out.  Lives in: House/apartment Home Access: Stairs to enter Home layout: One level Stairs: Yes: External: 4 to garage or front steps; on left going up Has following equipment at home: Single point cane, Walker - 2 wheeled, Crutches, Wheelchair (manual), and Electronics engineer  PLOF: Independent, Independent with household mobility with device, and Independent with community mobility with device  cane when had wound for ~1 year prior to amputation. Prior to wound no AD. Back limited distance  PATIENT GOALS:  to use  prosthesis to walk without pain going where she walks, bake, active in church including cooking in basement with no elevator  OBJECTIVE:  COGNITION: Overall cognitive status: Within functional limits for tasks assessed  POSTURE: rounded shoulders, forward head, flexed trunk , and weight shift right  LOWER EXTREMITY ROM:  ROM P:passive  A:active Right eval Left eval  Hip flexion  WFL  Hip extension  [facility]  Hip abduction  WFL  Hip adduction    Hip internal rotation    Hip external rotation    Knee flexion  90*  Knee extension  -5*  Ankle dorsiflexion  NA  Ankle plantarflexion  NA  Ankle inversion  NA  Ankle eversion  NA   (Blank rows = not tested)  LOWER EXTREMITY MMT:  MMT Right eval Left eval  Hip flexion  4/5  Hip extension  3+/5  Hip abduction  3+/5  Hip adduction    Hip internal rotation    Hip external rotation    Knee flexion  3+/5  Knee extension  4-/5  Ankle dorsiflexion  NA  Ankle plantarflexion  NA  Ankle inversion  NA  Ankle eversion  NA  (Blank rows = not tested)  TRANSFERS: Sit to stand: SBA requires armrests & BUEs to push up but able to stabilize without touching RW Stand to sit: SBA requires armrests & BUEs to control descent.   GAIT: Gait pattern: step to pattern, decreased step length- Right, decreased stance time- Left, decreased hip/knee flexion- Left, circumduction- Left, Left hip hike, knee flexed in stance- Left, antalgic, lateral hip instability, trunk flexed, and abducted- Left Distance walked: 57' Assistive device utilized: Environmental consultant - 2 wheeled and TTA prosthesis Level of assistance: SBA  verbal cues only with RW Comments: excessive UE weight bearing on RW  FUNCTIONAL TESTs:  Dizziness with head motions.  Berg Balance Scale: 19/56  [facility] PT Assessment - 04/21/22 1505       Standardized Balance Assessment   Standardized Balance Assessment Berg Balance Test      Berg Balance Test   Sit to Stand Able to stand  independently using  hands    Standing Unsupported Able to stand 2 minutes with supervision    Sitting with Back Unsupported but Feet Supported on Floor or Stool Able to sit safely and securely 2 minutes    Stand to Sit Controls descent by using hands    Transfers Able to transfer safely, definite need of hands    Standing Unsupported with Eyes Closed Needs help to keep from falling    Standing Unsupported with Feet  Together Needs help to attain position and unable to hold for 15 seconds    From Standing, Reach Forward with Outstretched Arm Reaches forward but needs supervision    From Standing Position, Pick up Object from Floor Unable to pick up and needs supervision    From Standing Position, Turn to Look Behind Over each Shoulder Needs supervision when turning    Turn 360 Degrees Needs assistance while turning    Standing Unsupported, Alternately Place Feet on Step/Stool Needs assistance to keep from falling or unable to try    Standing Unsupported, One Foot in [employer] balance while stepping or standing    Standing on One Leg Unable to try or needs assist to prevent fall    Total Score 19    Berg comment: BERG  < 36 high risk for falls (close to 100%) 46-51 moderate (>50%)   37-45 significant (>80%) 52-55 lower (> 25%)              CURRENT PROSTHETIC WEAR ASSESSMENT:  Patient is independent with: prosthetic cleaning Patient is dependent with: skin check, residual limb care, correct ply sock adjustment, proper wear schedule/adjustment, and proper weight-bearing schedule/adjustment Donning prosthesis: SBA Doffing prosthesis: Modified independence Prosthetic wear tolerance: ~14 hours total between 2x/day, 7 days/week Prosthetic weight bearing tolerance: 5 minutes with partial weight on prosthesis with limb pain 8/10 Edema: pitting Residual limb condition: no open areas but one small area distal tibia that may be internal suture working out,  shiny skin with no hair growth, redness over patella &  distal tibia, no temperature issues noted. Cylindrical shape Prosthetic description: silicon liner with pin lock suspension, total contact socket with flexible inner socket, dynamic response foot/ankle    TODAY'S TREATMENT:                                                                                                                             DATE:  04/21/2022  PATIENT EDUCATION: PATIENT EDUCATED ON FOLLOWING PROSTHETIC CARE: Education details: PT spent increased time with donning liner & socket.    Skin check, Correct ply sock adjustment, and Propper donning Person educated: Patient Education method: Explanation, Demonstration, Tactile cues, and Verbal cues Education comprehension: verbalized understanding, returned demonstration, verbal cues required, tactile cues required, and needs further education  HOME EXERCISE PROGRAM:  ASSESSMENT:  CLINICAL IMPRESSION: Patient is a 72 y.o. female who was seen today for physical therapy evaluation and treatment for prosthetic training with left Transtibial Amputation.  Patient is dependent and proper prosthetic care including donning which limits utilization of prosthesis.  Patient has pain in residual limb with prosthetic wear and weightbearing which limits her activities.  Patient has dizziness with head motions increasing fall risk.  Berg balance 19/56 indicates dependency and standing ADLs and high fall risk.  Patient's prosthetic gait is dependent on rolling walker with excessive weightbearing with upper extremities and gait deviations indicating high fall risk.  Patient would benefit from skilled intervention  to increase safety and function with her prosthesis.  OBJECTIVE IMPAIRMENTS: Abnormal gait, decreased activity tolerance, decreased balance, decreased endurance, decreased knowledge of condition, decreased knowledge of use of DME, decreased mobility, difficulty walking, decreased ROM, decreased strength, increased edema, impaired  flexibility, postural dysfunction, prosthetic dependency , obesity, and pain.   ACTIVITY LIMITATIONS: carrying, lifting, bending, standing, stairs, transfers, and locomotion level  PARTICIPATION LIMITATIONS: meal prep, cleaning, laundry, driving, community activity, and church  PERSONAL FACTORS: Fitness, Time since onset of injury/illness/exacerbation, and 3+ comorbidities: see PMH  are also affecting patient's functional outcome.   REHAB POTENTIAL: Good  CLINICAL DECISION MAKING: Evolving/moderate complexity  EVALUATION COMPLEXITY: Moderate   GOALS: Goals reviewed with patient? Yes  SHORT TERM GOALS: Target date: 05/22/2022  Patient donnes prosthesis modified independent & verbalizes proper cleaning. Baseline: SEE OBJECTIVE DATA Goal status: INITIAL 2.  Patient tolerates prosthesis >12 hrs total /day without skin issues or limb pain <5/10 after standing. Baseline: SEE OBJECTIVE DATA Goal status: INITIAL  3. Berg Balance >/= 23/56. Baseline: SEE OBJECTIVE DATA Goal status: INITIAL  4. Patient ambulates 44' with RW & prosthesis with supervision. Baseline: SEE OBJECTIVE DATA Goal status: INITIAL  5. Patient negotiates ramps & curbs with RW & prosthesis with supervision. Baseline: SEE OBJECTIVE DATA Goal status: INITIAL  LONG TERM GOALS: Target date: 07/17/2022  Patient demonstrates & verbalized understanding of prosthetic care to enable safe utilization of prosthesis. Baseline: SEE OBJECTIVE DATA Goal status: INITIAL  Patient tolerates prosthesis wear >90% of awake hours without skin or limb pain issues. Baseline: SEE OBJECTIVE DATA Goal status: INITIAL  Berg Balance >36/56 to indicate lower fall risk Baseline: SEE OBJECTIVE DATA Goal status: INITIAL  Patient ambulates >300' with prosthesis & LRADindependently Baseline: SEE OBJECTIVE DATA Goal status: INITIAL  Patient negotiates ramps, curbs with LRAD & stairs with single rail with prosthesis  independently. Baseline: SEE OBJECTIVE DATA Goal status: INITIAL  Patient verbalizes & demonstrates understanding of HEP Baseline: SEE OBJECTIVE DATA Goal status: INITIAL  PLAN:  PT FREQUENCY: 2x/week  PT DURATION: 12 weeks  PLANNED INTERVENTIONS: Therapeutic exercises, Therapeutic activity, Neuromuscular re-education, Balance training, Gait training, Patient/Family education, Self Care, Stair training, Vestibular training, Canalith repositioning, Prosthetic training, DME instructions, and physical performance training  PLAN FOR NEXT SESSION: instruct in adjusting ply socks with too few, too many & correct ply.  Set up HEP at sink   Jamey Reas, PT, DPT 04/21/2022, 4:20 PM

## 2022-04-23 ENCOUNTER — Ambulatory Visit (INDEPENDENT_AMBULATORY_CARE_PROVIDER_SITE_OTHER): Payer: Medicare Other | Admitting: Physical Therapy

## 2022-04-23 ENCOUNTER — Encounter: Payer: Self-pay | Admitting: Physical Therapy

## 2022-04-23 DIAGNOSIS — M79662 Pain in left lower leg: Secondary | ICD-10-CM | POA: Diagnosis not present

## 2022-04-23 DIAGNOSIS — R2681 Unsteadiness on feet: Secondary | ICD-10-CM | POA: Diagnosis not present

## 2022-04-23 DIAGNOSIS — R2689 Other abnormalities of gait and mobility: Secondary | ICD-10-CM | POA: Diagnosis not present

## 2022-04-23 DIAGNOSIS — R42 Dizziness and giddiness: Secondary | ICD-10-CM

## 2022-04-23 DIAGNOSIS — R29898 Other symptoms and signs involving the musculoskeletal system: Secondary | ICD-10-CM

## 2022-04-23 DIAGNOSIS — M6281 Muscle weakness (generalized): Secondary | ICD-10-CM | POA: Diagnosis not present

## 2022-04-23 NOTE — Therapy (Signed)
OUTPATIENT PHYSICAL THERAPY TREATMENT NOTE   Patient Name: Tammy Good MRN: [id number] DOB:1951/03/18, 72 y.o., female Today's Date: 04/23/2022  PCP: general provider REFERRING PROVIDER: Melony Overly, MD   END OF SESSION:   PT End of Session - 04/23/22 1346     Visit Number 2    Number of Visits 25    Date for PT Re-Evaluation 07/17/22    Authorization Type Medicare & BCBS state health PPO    Progress Note Due on Visit 10    PT Start Time 1345    PT Stop Time 1428    PT Time Calculation (min) 43 min    Equipment Utilized During Treatment Gait belt    Activity Tolerance Patient tolerated treatment well;Patient limited by pain    Behavior During Therapy WFL for tasks assessed/performed             Past Medical History:  Diagnosis Date   CAD S/P percutaneous coronary angioplasty 08/2013   100% mRCA - PCI Integrity Resolute DES 3.0 mm x 38 mm - 3.35 mm; PTCA of RPA V 2.0 mm x 15 mm   CHF (congestive heart failure) ([place])    Cholesteatoma    right   Diabetes mellitus type 2 in obese ([place])    On insulin and Invokana   History of osteomyelitis L 5th Toe all 05/2019   s/p Partial Ray Amputation with partial closure; 6 wks Abx & LSFA Atherectomy/DEB PTA with Stent for focal dissection.   Hyperlipidemia with target LDL less than 70    Hypothyroidism (acquired)    Mild essential hypertension    Obesity (BMI 30-39.9) 11/17/2013   PAD (peripheral artery disease) ([place]) 05/26/2019   05/26/19: Abd AoGram- BLE runoff -> L SFA orbital atherectomy - PTA w/ DEB & Stent 6 x 40 Luttonix (for focal dissection) - patent Pop A with 3 V runoff. LEA Dopplers 01/03/2020: RABI (prev) 0.68 (0.53)/ RTBI (prev) 0.58 (0.33); LABI (prev) 0.80 (0.64), LTBI (prev) 0.64 (0.51); R mSFA ~50-74%, L mSFA 50-74%. Patent Prox SFA stent < 49% stenosis   Panhypopituitarism (HCC)    ST elevation myocardial infarction (STEMI) of inferior wall, subsequent episode of care ([place]) 08/2013   80% branch of D1,  40% mid AV groove circumflex, 100% RCA with subacute thrombus -- thrombus extending into RPA V with 100% occlusion after initial angioplasty of mid RCA ;; Post MI ECHO 6/9/'15: EF 50-55%, mild LVH with moderate HK of inferior wall, Gr1 DD, mild LA dilation; mildly reduced RV function   Past Surgical History:  Procedure Laterality Date   ABDOMINAL AORTOGRAM W/LOWER EXTREMITY N/A 05/26/2019   Procedure: ABDOMINAL AORTOGRAM W/LOWER EXTREMITY;  Surgeon: Marty Heck, MD;  Location: [facility];  Service: Cardiovascular;  Laterality: N/A;   ABDOMINAL AORTOGRAM W/LOWER EXTREMITY N/A 11/15/2020   Procedure: ABDOMINAL AORTOGRAM W/LOWER EXTREMITY;  Surgeon: Marty Heck, MD;  Location: [facility];  Service: Cardiovascular;  Laterality: N/A;   ABDOMINAL AORTOGRAM W/LOWER EXTREMITY N/A 04/18/2021   Procedure: ABDOMINAL AORTOGRAM W/LOWER EXTREMITY;  Surgeon: Marty Heck, MD;  Location: [facility];  Service: Cardiovascular;  Laterality: N/A;   ABDOMINAL AORTOGRAM W/LOWER EXTREMITY Left 07/11/2021   Procedure: ABDOMINAL AORTOGRAM W/LOWER EXTREMITY;  Surgeon: Marty Heck, MD;  Location: [facility];  Service: Cardiovascular;  Laterality: Left;   AMPUTATION Left 05/25/2019   Procedure: AMPUTATION RAY 5th;  Surgeon: Trula Slade, DPM;  Location: [facility];  Service: Podiatry;  Laterality: Left;  AMPUTATION Left 01/14/2022   Procedure: LEFT BELOW THE KNEE AMPUTATION;  Surgeon: Erle Crocker, MD;  Location: [facility];  Service: Orthopedics;  Laterality: Left;  LENGTH OF SURGERY: 90 MINUTES   APPLICATION OF WOUND VAC Left 01/14/2022   Procedure: WOUND VAC PLACEMENT;  Surgeon: Erle Crocker, MD;  Location: [place];  Service: Orthopedics;  Laterality: Left;   BONE BIOPSY Left 04/24/2021   Procedure: BONE BIOPSY;  Surgeon: Landis Martins, DPM;  Location: [facility];  Service: Podiatry;  Laterality: Left;  left ankle/fibula   BONE BIOPSY Left  07/15/2021   Procedure: LEFT FOOT BONE BIOPSY;  Surgeon: Trula Slade, DPM;  Location: [facility];  Service: Podiatry;  Laterality: Left;   Cardiac Event Monitor  July-August 2015   Sinus rhythm with PVCs   CHOLECYSTECTOMY     COLONOSCOPY N/A 08/31/2013   Procedure: COLONOSCOPY;  Surgeon: Juanita Craver, MD;  Location: [facility] ENDOSCOPY;  Service: Endoscopy;  Laterality: N/A;   CORONARY STENT INTERVENTION N/A 11/19/2020   Procedure: CORONARY STENT INTERVENTION;  Surgeon: Nelva Bush, MD;  Location: [facility] [place] [facility];  Service: Cardiovascular;  Laterality: N/A;   ESOPHAGOGASTRODUODENOSCOPY N/A 09/01/2013   Procedure: ESOPHAGOGASTRODUODENOSCOPY (EGD);  Surgeon: Beryle Beams, MD;  Location: [facility] ENDOSCOPY;  Service: Endoscopy;  Laterality: N/A;  bedside   ESOPHAGOGASTRODUODENOSCOPY (EGD) WITH PROPOFOL N/A 05/26/2021   Procedure: ESOPHAGOGASTRODUODENOSCOPY (EGD) WITH PROPOFOL;  Surgeon: Carol Ada, MD;  Location: [place];  Service: Gastroenterology;  Laterality: N/A;   EYE SURGERY Bilateral    bilateral cataracts   INCISION AND DRAINAGE Left 04/24/2021   Procedure: INCISION AND DRAINAGE;  Surgeon: Landis Martins, DPM;  Location: [place];  Service: Podiatry;  Laterality: Left;   IRRIGATION AND DEBRIDEMENT FOOT Left 07/15/2021   Procedure: IRRIGATION AND DEBRIDEMENT FOOT;  Surgeon: Trula Slade, DPM;  Location: [facility];  Service: Podiatry;  Laterality: Left;   LEFT HEART CATH AND CORONARY ANGIOGRAPHY N/A 11/19/2020   Procedure: LEFT HEART CATH AND CORONARY ANGIOGRAPHY;  Surgeon: Nelva Bush, MD;  Location: [facility];  Service: Cardiovascular;  Laterality: N/A;   LEFT HEART CATHETERIZATION WITH CORONARY ANGIOGRAM N/A 08/30/2013   Procedure: LEFT HEART CATHETERIZATION WITH CORONARY ANGIOGRAM;  Surgeon: [place]ie Man, MD;  Location: [facility] CATH LAB: 100% mRCA (thrombus - extends to RPAV), 80% D1, 40% AVG Cx.   PERCUTANEOUS CORONARY STENT INTERVENTION (PCI-S)  08/30/2013    Procedure: PERCUTANEOUS CORONARY STENT INTERVENTION (PCI-S);  Surgeon: [place]ie Man, MD;  Location: [facility] CATH LAB;  Integrity Resolute DES 2.0 mm x 38 mm -- 3.35 mm.; PTCA of proximal RPA V. - 3.0 mm x 15 mm balloon   PERIPHERAL VASCULAR ATHERECTOMY  05/26/2019   Procedure: PERIPHERAL VASCULAR ATHERECTOMY;  Surgeon: Marty Heck, MD;  Location: [facility];  Service: Cardiovascular;;  Left SFA   PERIPHERAL VASCULAR BALLOON ANGIOPLASTY Left 07/11/2021   Procedure: PERIPHERAL VASCULAR BALLOON ANGIOPLASTY;  Surgeon: Marty Heck, MD;  Location: [facility];  Service: Cardiovascular;  Laterality: Left;  PT TRUNK / AT   PERIPHERAL VASCULAR INTERVENTION  05/26/2019   Procedure: PERIPHERAL VASCULAR INTERVENTION;  Surgeon: Marty Heck, MD;  Location: [place] [facility];  Service: Cardiovascular;;  Left SFA   PERIPHERAL VASCULAR INTERVENTION Right 11/15/2020   Procedure: PERIPHERAL VASCULAR INTERVENTION;  Surgeon: Marty Heck, MD;  Location: [place] [facility];  Service: Cardiovascular;  Laterality: Right;  Superficial Femoral Artery   PERIPHERAL VASCULAR INTERVENTION Left 07/11/2021   Procedure: PERIPHERAL VASCULAR  INTERVENTION;  Surgeon: Marty Heck, MD;  Location: [facility] [place] [facility];  Service: Cardiovascular;  Laterality: Left;  SFA   PITUITARY SURGERY     TEE WITHOUT CARDIOVERSION N/A 04/26/2021   Procedure: TRANSESOPHAGEAL ECHOCARDIOGRAM (TEE);  Surgeon: Buford Dresser, MD;  Location: [facility] ENDOSCOPY;  Service: Cardiovascular;  Laterality: N/A;   TRANSTHORACIC ECHOCARDIOGRAM  08/30/2013   mild LVH. EF 50-55%. Moderate HK of the entire inferior myocardium. GR 1 DD. Mild LA dilation. Mildly reduced RV function   TYMPANOMASTOIDECTOMY Right 12/28/2017   Procedure: RIGHT TYMPANOMASTOIDECTOMY;  Surgeon: Leta Baptist, MD;  Location: [place];  Service: ENT;  Laterality: Right;   Patient Active Problem List   Diagnosis Date Noted    Nocturia more than twice per night 02/03/2022   Unilateral complete BKA, left, initial encounter ([place]) 01/17/2022   Osteomyelitis ([place]) 01/13/2022   Normocytic anemia 01/13/2022   Atrial fibrillation ([place]) 08/15/2021   Septic arthritis of foot ([place]) 08/02/2021   Medication monitoring encounter 08/02/2021   Acute on chronic systolic CHF (congestive heart failure) ([place]) 06/02/2021   Hypothyroidism 06/02/2021   Ulcer of esophagus without bleeding    Pressure injury of skin 05/31/2021   Hypokalemia 05/30/2021   Symptomatic anemia 05/29/2021   AKI (acute kidney injury) ([place]) 05/29/2021   GI bleed 05/29/2021   Liver lesion 05/29/2021   Vertigo 05/24/2021   Generalized weakness 05/24/2021   Tenosynovitis of left ankle 04/25/2021   Thrombocytosis 04/23/2021   Fatty liver 06/19/2020   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 ([place]) 54/56/2563   Atherosclerotic heart disease of native coronary artery without angina pectoris 03/20/2020   Epigastric pain 03/20/2020   Nausea 03/20/2020   Absence of toe ([place]) 06/28/2019   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   PAD (peripheral artery disease) ([place]) 05/26/2019   Osteomyelitis of left foot ([place]) 05/24/2019   Cellulitis and abscess of foot, except toes 05/18/2019   Chronic osteomyelitis of ankle and foot ([place]) 05/18/2019   Cholesteatoma 02/24/2019   Cellulitis and abscess of toe 11/09/2018   Colon cancer screening 11/30/2017   Long term (current) use of insulin ([place]) 03/05/2017   Iron deficiency anemia 12/10/2014   Obesity (BMI 30-39.9) 11/17/2013   Diabetes mellitus, type 2 ([place])    Essential hypertension    Right thigh pain 11/01/2013   PVC's (premature ventricular contractions) 11/01/2013   Status post insertion of drug eluting coronary artery stent to [facility] emergently and PTCA to prox. PLA 09/16/2013   Hyperlipidemia associated with type 2 diabetes mellitus ([place]) 09/16/2013   Presence of coronary angioplasty  implant and graft 09/08/2013   Panhypopituitarism ([place]) 08/30/2013   Non-ST elevation (NSTEMI) myocardial infarction ([place]) 08/22/2013   CAD S/P percutaneous coronary angioplasty 08/22/2013    REFERRING DIAG: S93.734 (ICD-10-CM) - Hx of BKA, left    ONSET DATE:  03/19/2022 prosthesis delivery   THERAPY DIAG:  Other abnormalities of gait and mobility  Unsteadiness on feet  Pain in left lower leg  Muscle weakness (generalized)  Dizziness and giddiness  Other symptoms and signs involving the musculoskeletal system  Rationale for Evaluation and Treatment Rehabilitation  PERTINENT HISTORY: CAD, STEMI, CHF, PAD, A-Fib, IDDM2, severe septic arthritis   PRECAUTIONS: Fall  SUBJECTIVE:  SUBJECTIVE STATEMENT:  She has been working on getting prosthesis on as PT directed & it's easier.   PAIN:  Are you having pain? Yes: NPRS scale:  today1-2/10 and in last week 1 /10-10/10 Pain location: left distal tibia, posterior knee & distal limb.  Pain description: sore Aggravating factors: walking & standing.  Relieving factors: taking prosthesis off   Back pain lumbar: 0/10 today              If walks >50-75' her back flares up.    OBJECTIVE: (objective measures completed at initial evaluation unless otherwise dated)  COGNITION: Overall cognitive status: Within functional limits for tasks assessed   POSTURE: rounded shoulders, forward head, flexed trunk , and weight shift right   LOWER EXTREMITY ROM:   ROM P:passive  A:active Right eval Left eval  Hip flexion   WFL  Hip extension   [facility]  Hip abduction   WFL  Hip adduction      Hip internal rotation      Hip external rotation      Knee flexion   90*  Knee extension   -5*  Ankle dorsiflexion   NA  Ankle plantarflexion   NA  Ankle inversion   NA   Ankle eversion   NA   (Blank rows = not tested)   LOWER EXTREMITY MMT:   MMT Right eval Left eval  Hip flexion   4/5  Hip extension   3+/5  Hip abduction   3+/5  Hip adduction      Hip internal rotation      Hip external rotation      Knee flexion   3+/5  Knee extension   4-/5  Ankle dorsiflexion   NA  Ankle plantarflexion   NA  Ankle inversion   NA  Ankle eversion   NA  (Blank rows = not tested)   TRANSFERS: Sit to stand: SBA requires armrests & BUEs to push up but able to stabilize without touching RW Stand to sit: SBA requires armrests & BUEs to control descent.    GAIT: Gait pattern: step to pattern, decreased step length- Right, decreased stance time- Left, decreased hip/knee flexion- Left, circumduction- Left, Left hip hike, knee flexed in stance- Left, antalgic, lateral hip instability, trunk flexed, and abducted- Left Distance walked: 21' Assistive device utilized: Environmental consultant - 2 wheeled and TTA prosthesis Level of assistance: SBA  verbal cues only with RW Comments: excessive UE weight bearing on RW   FUNCTIONAL TESTs:  Dizziness with head motions.  Berg Balance Scale: 19/56   [facility] PT Assessment - 04/21/22 1505                Standardized Balance Assessment    Standardized Balance Assessment Berg Balance Test          Berg Balance Test    Sit to Stand Able to stand  independently using hands     Standing Unsupported Able to stand 2 minutes with supervision     Sitting with Back Unsupported but Feet Supported on Floor or Stool Able to sit safely and securely 2 minutes     Stand to Sit Controls descent by using hands     Transfers Able to transfer safely, definite need of hands     Standing Unsupported with Eyes Closed Needs help to keep from falling     Standing Unsupported with Feet Together Needs help to attain position and unable to hold for 15 seconds     From Standing,  Reach Forward with Outstretched Arm Reaches forward but needs supervision     From  Standing Position, Pick up Object from Floor Unable to pick up and needs supervision     From Standing Position, Turn to Look Behind Over each Shoulder Needs supervision when turning     Turn 360 Degrees Needs assistance while turning     Standing Unsupported, Alternately Place Feet on Step/Stool Needs assistance to keep from falling or unable to try     Standing Unsupported, One Foot in [place] balance while stepping or standing     Standing on One Leg Unable to try or needs assist to prevent fall     Total Score 19     Berg comment: BERG  < 36 high risk for falls (close to 100%) 46-51 moderate (>50%)   37-45 significant (>80%) 52-55 lower (> 25%)                     CURRENT PROSTHETIC WEAR ASSESSMENT:  Patient is independent with: prosthetic cleaning Patient is dependent with: skin check, residual limb care, correct ply sock adjustment, proper wear schedule/adjustment, and proper weight-bearing schedule/adjustment Donning prosthesis: SBA Doffing prosthesis: Modified independence Prosthetic wear tolerance: ~14 hours total between 2x/day, 7 days/week Prosthetic weight bearing tolerance: 5 minutes with partial weight on prosthesis with limb pain 8/10 Edema: pitting Residual limb condition: no open areas but one small area distal tibia that may be internal suture working out,  shiny skin with no hair growth, redness over patella & distal tibia, no temperature issues noted. Cylindrical shape Prosthetic description: silicon liner with pin lock suspension, total contact socket with flexible inner socket, dynamic response foot/ankle       TODAY'S TREATMENT:                                                                                                                             DATE:  04/23/2022: Neuromuscular Re-ed:  Working on balance & proprioception using sock-limb interface  Do each exercise 1-2  times per day Do each exercise 5-10 repetitions Hold each exercise for 2 seconds  to feel your location  AT [place].  Try to find this position when standing still for activities.   USE TAPE ON FLOOR TO MARK THE MIDLINE POSITION which is even with middle of sink.  You also should try to feel with your limb pressure in socket.  You are trying to feel with limb what you used to feel with the bottom of your foot.  Side to Side Shift: Moving your hips only (not shoulders): move weight onto your left leg, HOLD/FEEL pressure in socket.  Move back to equal weight on each leg, HOLD/FEEL pressure in socket. Move weight onto your right leg, HOLD/FEEL pressure in socket. Move back to equal weight on each leg, HOLD/FEEL pressure in socket. Repeat.  Start with both hands on  sink, progress to hand on prosthetic side only, then no hands.  Front to Back Shift: Moving your hips only (not shoulders): move your weight forward onto your toes, HOLD/FEEL pressure in socket. Move your weight back to equal Flat Foot on both legs, HOLD/FEEL  pressure in socket. Move your weight back onto your heels, HOLD/FEEL  pressure in socket. Move your weight back to equal on both legs, HOLD/FEEL  pressure in socket. Repeat.  Start with both hands on sink, progress to hand on prosthetic side only, then no hands.  Moving Cones / Cups: With equal weight on each leg: Hold on with one hand the first time, then progress to no hand supports. Move cups from one side of sink to the other. Place cups ~2" out of your reach, progress to 10" beyond reach.  Place one hand in middle of sink and reach with other hand. Do both arms.  Then hover one hand and move cups with other hand.  Overhead/Upward Reaching: alternated reaching up to top cabinets or ceiling if no cabinets present. Keep equal weight on each leg. Start with one hand support on counter while other hand reaches and progress to no hand support with reaching.  ace one hand in middle of sink and reach with  other hand. Do both arms.  Then hover one hand and move cups with other hand.  5.   Looking Over Shoulders: With equal weight on each leg: alternate turning to look over your shoulders with one hand support on counter as needed.  Start with head motions only to look in front of shoulder, then even with shoulder and progress to looking behind you. To look to side, move head /eyes, then shoulder on side looking pulls back, shift more weight to side looking and pull hip back. Place one hand in middle of sink and let go with other hand so your shoulder can pull back. Switch hands to look other way.   Then hover one hand and look over shoulder. If looking right, use left hand at sink. If looking left, use right hand at sink. 6.  Stepping with leg that is not amputated:  Move items under cabinet out of your way. Shift your hips/pelvis so weight on prosthesis. Tighten muscles in hip on prosthetic side.  SLOWLY step other leg so front of foot is in cabinet. Then step back to floor.   Prosthetic Training with Transtibial Prosthesis: PT instructed adjusting ply socks with too few, too many & correct ply fit with verbal, demo, tactile & HO cues.  Pt verbalized understanding.   Hanger Socks: 1-ply is yellow color at top, 3-ply is green at top, 5-ply is navy blue at top How many ply you need depends on your limb size.  You should have even pressure on your limb when standing & walking.  Guidance points: 1. How ease it goes on? Should be some resistance. Too few it goes on too easily. Too many it takes a lot of work to get it on. 2. How many clicks you get. Especially clicks in sitting. 3. After standing or walking, check knee cap. Bottom should be just under the front lip.  Too few bottom of knee cap sits on indention. Too many bottom is above front lip.    Get ply socks correct before you leave the house. Take extra socks with you. Take one 3-ply and two 1-ply with you. This is in addition to what you are  wearing.   04/21/2022  PATIENT EDUCATION: PATIENT EDUCATED ON FOLLOWING PROSTHETIC CARE: Education details: PT spent increased time with donning liner & socket.    Skin check, Correct ply sock adjustment, and [place] donning Person educated: Patient Education method: Explanation, Demonstration, Tactile cues, and Verbal cues Education comprehension: verbalized understanding, returned demonstration, verbal cues required, tactile cues required, and needs further education   HOME EXERCISE PROGRAM:   ASSESSMENT:  CLINICAL IMPRESSION: Patient appears to have better understanding how to adjust ply socks.  She also appears to understand of HEP to work on balance & proprioception.     OBJECTIVE IMPAIRMENTS: Abnormal gait, decreased activity tolerance, decreased balance, decreased endurance, decreased knowledge of condition, decreased knowledge of use of DME, decreased mobility, difficulty walking, decreased ROM, decreased strength, increased edema, impaired flexibility, postural dysfunction, prosthetic dependency , obesity, and pain.    ACTIVITY LIMITATIONS: carrying, lifting, bending, standing, stairs, transfers, and locomotion level   PARTICIPATION LIMITATIONS: meal prep, cleaning, laundry, driving, community activity, and church   PERSONAL FACTORS: Fitness, Time since onset of injury/illness/exacerbation, and 3+ comorbidities: see PMH  are also affecting patient's functional outcome.    REHAB POTENTIAL: Good   CLINICAL DECISION MAKING: Evolving/moderate complexity   EVALUATION COMPLEXITY: Moderate     GOALS: Goals reviewed with patient? Yes   SHORT TERM GOALS: Target date: 05/22/2022   Patient donnes prosthesis modified independent & verbalizes proper cleaning. Baseline: SEE OBJECTIVE DATA Goal status: INITIAL 2.  Patient tolerates prosthesis >12 hrs total /day without skin issues or limb pain <5/10 after standing. Baseline: SEE OBJECTIVE DATA Goal status: INITIAL   3. Berg  Balance >/= 23/56. Baseline: SEE OBJECTIVE DATA Goal status: INITIAL   4. Patient ambulates 57' with RW & prosthesis with supervision. Baseline: SEE OBJECTIVE DATA Goal status: INITIAL   5. Patient negotiates ramps & curbs with RW & prosthesis with supervision. Baseline: SEE OBJECTIVE DATA Goal status: INITIAL   LONG TERM GOALS: Target date: 07/17/2022   Patient demonstrates & verbalized understanding of prosthetic care to enable safe utilization of prosthesis. Baseline: SEE OBJECTIVE DATA Goal status: INITIAL   Patient tolerates prosthesis wear >90% of awake hours without skin or limb pain issues. Baseline: SEE OBJECTIVE DATA Goal status: INITIAL   Berg Balance >36/56 to indicate lower fall risk Baseline: SEE OBJECTIVE DATA Goal status: INITIAL   Patient ambulates >300' with prosthesis & LRADindependently Baseline: SEE OBJECTIVE DATA Goal status: INITIAL   Patient negotiates ramps, curbs with LRAD & stairs with single rail with prosthesis independently. Baseline: SEE OBJECTIVE DATA Goal status: INITIAL   Patient verbalizes & demonstrates understanding of HEP Baseline: SEE OBJECTIVE DATA Goal status: INITIAL   PLAN:   PT FREQUENCY: 2x/week   PT DURATION: 12 weeks   PLANNED INTERVENTIONS: Therapeutic exercises, Therapeutic activity, Neuromuscular re-education, Balance training, Gait training, Patient/Family education, Self Care, Stair training, Vestibular training, Canalith repositioning, Prosthetic training, DME instructions, and physical performance training   PLAN FOR NEXT SESSION: verbally check HEP at sink.  Instruct in ramp & curb negotiation. Check vertigo.    Jamey Reas, PT, DPT 04/23/2022, 2:31 PM

## 2022-04-23 NOTE — Patient Instructions (Signed)
Do each exercise 1-2  times per day Do each exercise 5-10 repetitions Hold each exercise for 2 seconds to feel your location  AT [place].  Try to find this position when standing still for activities.   USE TAPE ON FLOOR TO MARK THE MIDLINE POSITION which is even with middle of sink.  You also should try to feel with your limb pressure in socket.  You are trying to feel with limb what you used to feel with the bottom of your foot.  Side to Side Shift: Moving your hips only (not shoulders): move weight onto your left leg, HOLD/FEEL pressure in socket.  Move back to equal weight on each leg, HOLD/FEEL pressure in socket. Move weight onto your right leg, HOLD/FEEL pressure in socket. Move back to equal weight on each leg, HOLD/FEEL pressure in socket. Repeat.  Start with both hands on sink, progress to hand on prosthetic side only, then no hands.  Front to Back Shift: Moving your hips only (not shoulders): move your weight forward onto your toes, HOLD/FEEL pressure in socket. Move your weight back to equal Flat Foot on both legs, HOLD/FEEL  pressure in socket. Move your weight back onto your heels, HOLD/FEEL  pressure in socket. Move your weight back to equal on both legs, HOLD/FEEL  pressure in socket. Repeat.  Start with both hands on sink, progress to hand on prosthetic side only, then no hands.  Moving Cones / Cups: With equal weight on each leg: Hold on with one hand the first time, then progress to no hand supports. Move cups from one side of sink to the other. Place cups ~2" out of your reach, progress to 10" beyond reach.  Place one hand in middle of sink and reach with other hand. Do both arms.  Then hover one hand and move cups with other hand.  Overhead/Upward Reaching: alternated reaching up to top cabinets or ceiling if no cabinets present. Keep equal weight on each leg. Start with one hand support on counter while other  hand reaches and progress to no hand support with reaching.  ace one hand in middle of sink and reach with other hand. Do both arms.  Then hover one hand and move cups with other hand.  5.   Looking Over Shoulders: With equal weight on each leg: alternate turning to look over your shoulders with one hand support on counter as needed.  Start with head motions only to look in front of shoulder, then even with shoulder and progress to looking behind you. To look to side, move head /eyes, then shoulder on side looking pulls back, shift more weight to side looking and pull hip back. Place one hand in middle of sink and let go with other hand so your shoulder can pull back. Switch hands to look other way.   Then hover one hand and look over shoulder. If looking right, use left hand at sink. If looking left, use right hand at sink. 6.  Stepping with leg that is not amputated:  Move items under cabinet out of your way. Shift your hips/pelvis so weight on prosthesis. Tighten muscles in hip on prosthetic side.  SLOWLY step other leg so front of foot is in cabinet. Then step back to floor.    Hanger Socks: 1-ply is yellow color at top, 3-ply is green at top, 5-ply is navy blue at top How many ply you need depends on  your limb size.  You should have even pressure on your limb when standing & walking.  Guidance points: 1. How ease it goes on? Should be some resistance. Too few it goes on too easily. Too many it takes a lot of work to get it on. 2. How many clicks you get. Especially clicks in sitting. 3. After standing or walking, check knee cap. Bottom should be just under the front lip.  Too few bottom of knee cap sits on indention. Too many bottom is above front lip.    Get ply socks correct before you leave the house. Take extra socks with you. Take one 3-ply and two 1-ply with you. This is in addition to what you are wearing.

## 2022-04-28 ENCOUNTER — Encounter: Payer: Self-pay | Admitting: Physical Therapy

## 2022-04-28 ENCOUNTER — Ambulatory Visit (INDEPENDENT_AMBULATORY_CARE_PROVIDER_SITE_OTHER): Payer: Medicare Other | Admitting: Physical Therapy

## 2022-04-28 DIAGNOSIS — M79662 Pain in left lower leg: Secondary | ICD-10-CM

## 2022-04-28 DIAGNOSIS — M6281 Muscle weakness (generalized): Secondary | ICD-10-CM | POA: Diagnosis not present

## 2022-04-28 DIAGNOSIS — R2681 Unsteadiness on feet: Secondary | ICD-10-CM | POA: Diagnosis not present

## 2022-04-28 DIAGNOSIS — R2689 Other abnormalities of gait and mobility: Secondary | ICD-10-CM

## 2022-04-28 DIAGNOSIS — R29898 Other symptoms and signs involving the musculoskeletal system: Secondary | ICD-10-CM

## 2022-04-28 DIAGNOSIS — R42 Dizziness and giddiness: Secondary | ICD-10-CM

## 2022-04-28 NOTE — Patient Instructions (Signed)
Orthostatic Hypotension Blood pressure is a measurement of how strongly, or weakly, your circulating blood is pressing against the walls of your arteries. Orthostatic hypotension is a drop in blood pressure that can happen when you change positions, such as when you go from lying down to standing. Arteries are blood vessels that carry blood from your heart throughout your body. When blood pressure is too low, you may not get enough blood to your brain or to the rest of your organs. Orthostatic hypotension can cause light-headedness, sweating, rapid heartbeat, blurred vision, and fainting. These symptoms require further investigation into the cause. What are the causes? Orthostatic hypotension can be caused by many things, including: Sudden changes in posture, such as standing up quickly after you have been sitting or lying down. Loss of blood (anemia) or loss of body fluids (dehydration). Heart problems, neurologic problems, or hormone problems. Pregnancy. Aging. The risk for this condition increases as you get older. Severe infection (sepsis). Certain medicines, such as medicines for high blood pressure or medicines that make the body lose excess fluids (diuretics). What are the signs or symptoms? Symptoms of this condition may include: Weakness, light-headedness, or dizziness. Sweating. Blurred vision. Tiredness (fatigue). Rapid heartbeat. Fainting, in severe cases. How is this diagnosed? This condition is diagnosed based on: Your symptoms and medical history. Your blood pressure measurements. Your health care provider will check your blood pressure when you are: Lying down. Sitting. Standing. A blood pressure reading is recorded as two numbers, such as "120 over 80" (or 120/80). The first ("top") number is called the systolic pressure. It is a measure of the pressure in your arteries as your heart beats. The second ("bottom") number is called the diastolic pressure. It is a measure of  the pressure in your arteries when your heart relaxes between beats. Blood pressure is measured in a unit called mmHg. Healthy blood pressure for most adults is 120/80 mmHg. Orthostatic hypotension is defined as a 20 mmHg drop in systolic pressure or a 10 mmHg drop in diastolic pressure within 3 minutes of standing. Other information or tests that may be used to diagnose orthostatic hypotension include: Your other vital signs, such as your heart rate and temperature. Blood tests. An electrocardiogram (ECG) or echocardiogram. A Holter monitor. This is a device you wear that records your heart rhythm continuously, usually for 24-48 hours. Tilt table test. For this test, you will be safely secured to a table that moves you from a lying position to an upright position. Your heart rhythm and blood pressure will be monitored during the test. How is this treated? This condition may be treated by: Changing your diet. This may involve eating more salt (sodium) or drinking more water. Changing the dosage of certain medicines you are taking that might be lowering your blood pressure. Correcting the underlying reason for the orthostatic hypotension. Wearing compression stockings. Taking medicines to raise your blood pressure. Avoiding actions that trigger symptoms. Follow these instructions at home: Medicines Take over-the-counter and prescription medicines only as told by your health care provider. Follow instructions from your health care provider about changing the dosage of your current medicines, if this applies. Do not stop or adjust any of your medicines on your own. Eating and drinking  Drink enough fluid to keep your urine pale yellow. Eat extra salt only as directed. Do not add extra salt to your diet unless advised by your health care provider. Eat frequent, small meals. Avoid standing up suddenly after eating. General instructions    Get up slowly from lying down or sitting positions. This  gives your blood pressure a chance to adjust. Avoid hot showers and excessive heat as directed by your health care provider. Engage in regular physical activity as directed by your health care provider. If you have compression stockings, wear them as told. Keep all follow-up visits. This is important. Contact a health care provider if: You have a fever for more than 2-3 days. You feel more thirsty than usual. You feel dizzy or weak. Get help right away if: You have chest pain. You have a fast or irregular heartbeat. You become sweaty or feel light-headed. You feel short of breath. You faint. You have any symptoms of a stroke. "BE FAST" is an easy way to remember the main warning signs of a stroke: B - Balance. Signs are dizziness, sudden trouble walking, or loss of balance. E - Eyes. Signs are trouble seeing or a sudden change in vision. F - Face. Signs are sudden weakness or numbness of the face, or the face or eyelid drooping on one side. A - Arms. Signs are weakness or numbness in an arm. This happens suddenly and usually on one side of the body. S - Speech. Signs are sudden trouble speaking, slurred speech, or trouble understanding what people say. T - Time. Time to call emergency services. Write down what time symptoms started. You have other signs of a stroke, such as: A sudden, severe headache with no known cause. Nausea or vomiting. Seizure. These symptoms may represent a serious problem that is an emergency. Do not wait to see if the symptoms will go away. Get medical help right away. Call your local emergency services (911 in the [place] Do not drive yourself to the hospital. Summary Orthostatic hypotension is a sudden drop in blood pressure. It can cause light-headedness, sweating, rapid heartbeat, blurred vision, and fainting. Orthostatic hypotension can be diagnosed by having your blood pressure taken while lying down, sitting, and then standing. Treatment may involve  changing your diet, wearing compression stockings, sitting up slowly, adjusting your medicines, or correcting the underlying reason for the orthostatic hypotension. Get help right away if you have chest pain, a fast or irregular heartbeat, or symptoms of a stroke. This information is not intended to replace advice given to you by your health care provider. Make sure you discuss any questions you have with your health care provider. Document Revised: 05/24/2020 Document Reviewed: 05/24/2020 [employer] Patient Education  2023 [employer]  

## 2022-04-28 NOTE — Therapy (Signed)
OUTPATIENT PHYSICAL THERAPY TREATMENT NOTE   Patient Name: SHELIE LANSING MRN: [id number] DOB:02-04-1951, 72 y.o., female Today's Date: 04/28/2022  PCP: general provider REFERRING PROVIDER: Melony Overly, MD   END OF SESSION:   PT End of Session - 04/28/22 1516     Visit Number 3    Number of Visits 25    Date for PT Re-Evaluation 07/17/22    Authorization Type Medicare & BCBS state health PPO    Progress Note Due on Visit 10    PT Start Time 1515    PT Stop Time 1559    PT Time Calculation (min) 44 min    Equipment Utilized During Treatment Gait belt    Activity Tolerance Patient tolerated treatment well;Patient limited by pain    Behavior During Therapy WFL for tasks assessed/performed             Past Medical History:  Diagnosis Date   CAD S/P percutaneous coronary angioplasty 08/2013   100% mRCA - PCI Integrity Resolute DES 3.0 mm x 38 mm - 3.35 mm; PTCA of RPA V 2.0 mm x 15 mm   CHF (congestive heart failure) ([place])    Cholesteatoma    right   Diabetes mellitus type 2 in obese (HCC)    On insulin and Invokana   History of osteomyelitis L 5th Toe all 05/2019   s/p Partial Ray Amputation with partial closure; 6 wks Abx & LSFA Atherectomy/DEB PTA with Stent for focal dissection.   Hyperlipidemia with target LDL less than 70    Hypothyroidism (acquired)    Mild essential hypertension    Obesity (BMI 30-39.9) 11/17/2013   PAD (peripheral artery disease) ([place]) 05/26/2019   05/26/19: Abd AoGram- BLE runoff -> L SFA orbital atherectomy - PTA w/ DEB & Stent 6 x 40 Luttonix (for focal dissection) - patent Pop A with 3 V runoff. LEA Dopplers 01/03/2020: RABI (prev) 0.68 (0.53)/ RTBI (prev) 0.58 (0.33); LABI (prev) 0.80 (0.64), LTBI (prev) 0.64 (0.51); R mSFA ~50-74%, L mSFA 50-74%. Patent Prox SFA stent < 49% stenosis   Panhypopituitarism (HCC)    ST elevation myocardial infarction (STEMI) of inferior wall, subsequent episode of care ([place]) 08/2013   80% branch of D1, 40%  mid AV groove circumflex, 100% RCA with subacute thrombus -- thrombus extending into RPA V with 100% occlusion after initial angioplasty of mid RCA ;; Post MI ECHO 6/9/'15: EF 50-55%, mild LVH with moderate HK of inferior wall, Gr1 DD, mild LA dilation; mildly reduced RV function   Past Surgical History:  Procedure Laterality Date   ABDOMINAL AORTOGRAM W/LOWER EXTREMITY N/A 05/26/2019   Procedure: ABDOMINAL AORTOGRAM W/LOWER EXTREMITY;  Surgeon: Marty Heck, MD;  Location: [place] [facility];  Service: Cardiovascular;  Laterality: N/A;   ABDOMINAL AORTOGRAM W/LOWER EXTREMITY N/A 11/15/2020   Procedure: ABDOMINAL AORTOGRAM W/LOWER EXTREMITY;  Surgeon: Marty Heck, MD;  Location: [place] [facility];  Service: Cardiovascular;  Laterality: N/A;   ABDOMINAL AORTOGRAM W/LOWER EXTREMITY N/A 04/18/2021   Procedure: ABDOMINAL AORTOGRAM W/LOWER EXTREMITY;  Surgeon: Marty Heck, MD;  Location: [place] [facility];  Service: Cardiovascular;  Laterality: N/A;   ABDOMINAL AORTOGRAM W/LOWER EXTREMITY Left 07/11/2021   Procedure: ABDOMINAL AORTOGRAM W/LOWER EXTREMITY;  Surgeon: Marty Heck, MD;  Location: [place] [facility];  Service: Cardiovascular;  Laterality: Left;   AMPUTATION Left 05/25/2019   Procedure: AMPUTATION RAY 5th;  Surgeon: Trula Slade, DPM;  Location: [place];  Service: Podiatry;  Laterality: Left;  AMPUTATION Left 01/14/2022   Procedure: LEFT BELOW THE KNEE AMPUTATION;  Surgeon: Erle Crocker, MD;  Location: [place];  Service: Orthopedics;  Laterality: Left;  LENGTH OF SURGERY: 90 MINUTES   APPLICATION OF WOUND VAC Left 01/14/2022   Procedure: WOUND VAC PLACEMENT;  Surgeon: Erle Crocker, MD;  Location: [place];  Service: Orthopedics;  Laterality: Left;   BONE BIOPSY Left 04/24/2021   Procedure: BONE BIOPSY;  Surgeon: Landis Martins, DPM;  Location: [place];  Service: Podiatry;  Laterality: Left;  left ankle/fibula   BONE BIOPSY Left 07/15/2021    Procedure: LEFT FOOT BONE BIOPSY;  Surgeon: Trula Slade, DPM;  Location: [place];  Service: Podiatry;  Laterality: Left;   Cardiac Event Monitor  July-August 2015   Sinus rhythm with PVCs   CHOLECYSTECTOMY     COLONOSCOPY N/A 08/31/2013   Procedure: COLONOSCOPY;  Surgeon: Juanita Craver, MD;  Location: [facility] ENDOSCOPY;  Service: Endoscopy;  Laterality: N/A;   CORONARY STENT INTERVENTION N/A 11/19/2020   Procedure: CORONARY STENT INTERVENTION;  Surgeon: Nelva Bush, MD;  Location: [place] [facility];  Service: Cardiovascular;  Laterality: N/A;   ESOPHAGOGASTRODUODENOSCOPY N/A 09/01/2013   Procedure: ESOPHAGOGASTRODUODENOSCOPY (EGD);  Surgeon: Beryle Beams, MD;  Location: [facility] ENDOSCOPY;  Service: Endoscopy;  Laterality: N/A;  bedside   ESOPHAGOGASTRODUODENOSCOPY (EGD) WITH PROPOFOL N/A 05/26/2021   Procedure: ESOPHAGOGASTRODUODENOSCOPY (EGD) WITH PROPOFOL;  Surgeon: Carol Ada, MD;  Location: [place];  Service: Gastroenterology;  Laterality: N/A;   EYE SURGERY Bilateral    bilateral cataracts   INCISION AND DRAINAGE Left 04/24/2021   Procedure: INCISION AND DRAINAGE;  Surgeon: Landis Martins, DPM;  Location: [place];  Service: Podiatry;  Laterality: Left;   IRRIGATION AND DEBRIDEMENT FOOT Left 07/15/2021   Procedure: IRRIGATION AND DEBRIDEMENT FOOT;  Surgeon: Trula Slade, DPM;  Location: [place];  Service: Podiatry;  Laterality: Left;   LEFT HEART CATH AND CORONARY ANGIOGRAPHY N/A 11/19/2020   Procedure: LEFT HEART CATH AND CORONARY ANGIOGRAPHY;  Surgeon: Nelva Bush, MD;  Location: [place] [facility];  Service: Cardiovascular;  Laterality: N/A;   LEFT HEART CATHETERIZATION WITH CORONARY ANGIOGRAM N/A 08/30/2013   Procedure: LEFT HEART CATHETERIZATION WITH CORONARY ANGIOGRAM;  Surgeon: Leonie Man, MD;  Location: [facility] CATH LAB: 100% mRCA (thrombus - extends to RPAV), 80% D1, 40% AVG Cx.   PERCUTANEOUS CORONARY STENT INTERVENTION (PCI-S)  08/30/2013   Procedure:  PERCUTANEOUS CORONARY STENT INTERVENTION (PCI-S);  Surgeon: Leonie Man, MD;  Location: [facility] CATH LAB;  Integrity Resolute DES 2.0 mm x 38 mm -- 3.35 mm.; PTCA of proximal RPA V. - 3.0 mm x 15 mm balloon   PERIPHERAL VASCULAR ATHERECTOMY  05/26/2019   Procedure: PERIPHERAL VASCULAR ATHERECTOMY;  Surgeon: Marty Heck, MD;  Location: [place] [facility];  Service: Cardiovascular;;  Left SFA   PERIPHERAL VASCULAR BALLOON ANGIOPLASTY Left 07/11/2021   Procedure: PERIPHERAL VASCULAR BALLOON ANGIOPLASTY;  Surgeon: Marty Heck, MD;  Location: [place] [facility];  Service: Cardiovascular;  Laterality: Left;  PT TRUNK / AT   PERIPHERAL VASCULAR INTERVENTION  05/26/2019   Procedure: PERIPHERAL VASCULAR INTERVENTION;  Surgeon: Marty Heck, MD;  Location: [place] [facility];  Service: Cardiovascular;;  Left SFA   PERIPHERAL VASCULAR INTERVENTION Right 11/15/2020   Procedure: PERIPHERAL VASCULAR INTERVENTION;  Surgeon: Marty Heck, MD;  Location: [place] [facility];  Service: Cardiovascular;  Laterality: Right;  Superficial Femoral Artery   PERIPHERAL VASCULAR INTERVENTION Left 07/11/2021   Procedure: PERIPHERAL VASCULAR  INTERVENTION;  Surgeon: Marty Heck, MD;  Location: [place] [facility];  Service: Cardiovascular;  Laterality: Left;  SFA   PITUITARY SURGERY     TEE WITHOUT CARDIOVERSION N/A 04/26/2021   Procedure: TRANSESOPHAGEAL ECHOCARDIOGRAM (TEE);  Surgeon: Buford Dresser, MD;  Location: [facility] ENDOSCOPY;  Service: Cardiovascular;  Laterality: N/A;   TRANSTHORACIC ECHOCARDIOGRAM  08/30/2013   mild LVH. EF 50-55%. Moderate HK of the entire inferior myocardium. GR 1 DD. Mild LA dilation. Mildly reduced RV function   TYMPANOMASTOIDECTOMY Right 12/28/2017   Procedure: RIGHT TYMPANOMASTOIDECTOMY;  Surgeon: Leta Baptist, MD;  Location: [place];  Service: ENT;  Laterality: Right;   Patient Active Problem List   Diagnosis Date Noted   Nocturia  more than twice per night 02/03/2022   Unilateral complete BKA, left, initial encounter ([place]) 01/17/2022   Osteomyelitis ([place]) 01/13/2022   Normocytic anemia 01/13/2022   Atrial fibrillation ([place]) 08/15/2021   Septic arthritis of foot ([place]) 08/02/2021   Medication monitoring encounter 08/02/2021   Acute on chronic systolic CHF (congestive heart failure) ([place]) 06/02/2021   Hypothyroidism 06/02/2021   Ulcer of esophagus without bleeding    Pressure injury of skin 05/31/2021   Hypokalemia 05/30/2021   Symptomatic anemia 05/29/2021   AKI (acute kidney injury) ([place]) 05/29/2021   GI bleed 05/29/2021   Liver lesion 05/29/2021   Vertigo 05/24/2021   Generalized weakness 05/24/2021   Tenosynovitis of left ankle 04/25/2021   Thrombocytosis 04/23/2021   Fatty liver 06/19/2020   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 ([place]) 50/53/9767   Atherosclerotic heart disease of native coronary artery without angina pectoris 03/20/2020   Epigastric pain 03/20/2020   Nausea 03/20/2020   Absence of toe ([place]) 06/28/2019   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   PAD (peripheral artery disease) ([place]) 05/26/2019   Osteomyelitis of left foot ([place]) 05/24/2019   Cellulitis and abscess of foot, except toes 05/18/2019   Chronic osteomyelitis of ankle and foot ([place]) 05/18/2019   Cholesteatoma 02/24/2019   Cellulitis and abscess of toe 11/09/2018   Colon cancer screening 11/30/2017   Long term (current) use of insulin ([place]) 03/05/2017   Iron deficiency anemia 12/10/2014   Obesity (BMI 30-39.9) 11/17/2013   Diabetes mellitus, type 2 ([place])    Essential hypertension    Right thigh pain 11/01/2013   PVC's (premature ventricular contractions) 11/01/2013   Status post insertion of drug eluting coronary artery stent to [facility] emergently and PTCA to prox. PLA 09/16/2013   Hyperlipidemia associated with type 2 diabetes mellitus ([place]) 09/16/2013   Presence of coronary angioplasty implant  and graft 09/08/2013   Panhypopituitarism ([place]) 08/30/2013   Non-ST elevation (NSTEMI) myocardial infarction ([place]) 08/22/2013   CAD S/P percutaneous coronary angioplasty 08/22/2013    REFERRING DIAG: H41.937 (ICD-10-CM) - Hx of BKA, left    ONSET DATE:  03/19/2022 prosthesis delivery   THERAPY DIAG:  Other abnormalities of gait and mobility  Unsteadiness on feet  Pain in left lower leg  Muscle weakness (generalized)  Dizziness and giddiness  Other symptoms and signs involving the musculoskeletal system  Rationale for Evaluation and Treatment Rehabilitation  PERTINENT HISTORY: CAD, STEMI, CHF, PAD, A-Fib, IDDM2, severe septic arthritis   PRECAUTIONS: Fall  SUBJECTIVE:  SUBJECTIVE STATEMENT:  Her vertigo has been bothering her.  No falls.  She is wearing prosthesis all awake hours now without issues.  Her HEP at sink is going well.    PAIN:  Are you having pain? Yes: NPRS scale:  today 1/10 and in last week 1 /10-8/10 Pain location: left distal tibia, posterior knee & distal limb.  Pain description: sore Aggravating factors: walking & standing.  Relieving factors: taking prosthesis off   Back pain lumbar: 0/10 today              If walks >50-75' her back flares up.    OBJECTIVE: (objective measures completed at initial evaluation unless otherwise dated)  COGNITION: Overall cognitive status: Within functional limits for tasks assessed   POSTURE: rounded shoulders, forward head, flexed trunk , and weight shift right   LOWER EXTREMITY ROM:   ROM P:passive  A:active Right eval Left eval  Hip flexion   WFL  Hip extension   [facility]  Hip abduction   WFL  Hip adduction      Hip internal rotation      Hip external rotation      Knee flexion   90*  Knee extension   -5*  Ankle dorsiflexion    NA  Ankle plantarflexion   NA  Ankle inversion   NA  Ankle eversion   NA   (Blank rows = not tested)   LOWER EXTREMITY MMT:   MMT Right eval Left eval  Hip flexion   4/5  Hip extension   3+/5  Hip abduction   3+/5  Hip adduction      Hip internal rotation      Hip external rotation      Knee flexion   3+/5  Knee extension   4-/5  Ankle dorsiflexion   NA  Ankle plantarflexion   NA  Ankle inversion   NA  Ankle eversion   NA  (Blank rows = not tested)   TRANSFERS: Sit to stand: SBA requires armrests & BUEs to push up but able to stabilize without touching RW Stand to sit: SBA requires armrests & BUEs to control descent.    GAIT: Gait pattern: step to pattern, decreased step length- Right, decreased stance time- Left, decreased hip/knee flexion- Left, circumduction- Left, Left hip hike, knee flexed in stance- Left, antalgic, lateral hip instability, trunk flexed, and abducted- Left Distance walked: 60' Assistive device utilized: Environmental consultant - 2 wheeled and TTA prosthesis Level of assistance: SBA  verbal cues only with RW Comments: excessive UE weight bearing on RW   FUNCTIONAL TESTs:  04/28/2022:  PT assessed vertigo:  Brayton Caves  - left cervical rotation sit to supine no nystagmus, right rotation no dizziness and rolling right. -right cervical rotation sit to supine no nystagmus, left rotation slight dizziness and rolling left slight dizziness. Nestor Lewandowsky To right light dizziness sit to supine & severe dizziness sitting up. To left light dizziness sit to supine & severe dizziness sitting up.  Orthostatic Hypotension Supine no dizziness  145/70 HR 80  Standing light dizziness 119/67 HR 108 After standing 3 min  no dizziness BP 158/76 HR 112 Positive for orthostatic Hypotension   04/21/2022: Dizziness with head motions.  Berg Balance Scale: 19/56   [facility] PT Assessment - 04/21/22 1505                Standardized Balance Assessment    Standardized Balance Assessment  Berg Balance Test  Berg Balance Test    Sit to Stand Able to stand  independently using hands     Standing Unsupported Able to stand 2 minutes with supervision     Sitting with Back Unsupported but Feet Supported on Floor or Stool Able to sit safely and securely 2 minutes     Stand to Sit Controls descent by using hands     Transfers Able to transfer safely, definite need of hands     Standing Unsupported with Eyes Closed Needs help to keep from falling     Standing Unsupported with Feet Together Needs help to attain position and unable to hold for 15 seconds     From Standing, Reach Forward with Outstretched Arm Reaches forward but needs supervision     From Standing Position, Pick up Object from Floor Unable to pick up and needs supervision     From Standing Position, Turn to Look Behind Over each Shoulder Needs supervision when turning     Turn 360 Degrees Needs assistance while turning     Standing Unsupported, Alternately Place Feet on Step/Stool Needs assistance to keep from falling or unable to try     Standing Unsupported, One Foot in [employer] balance while stepping or standing     Standing on One Leg Unable to try or needs assist to prevent fall     Total Score 19     Berg comment: BERG  < 36 high risk for falls (close to 100%) 46-51 moderate (>50%)   37-45 significant (>80%) 52-55 lower (> 25%)                     CURRENT PROSTHETIC WEAR ASSESSMENT:  Patient is independent with: prosthetic cleaning Patient is dependent with: skin check, residual limb care, correct ply sock adjustment, proper wear schedule/adjustment, and proper weight-bearing schedule/adjustment Donning prosthesis: SBA Doffing prosthesis: Modified independence Prosthetic wear tolerance: ~14 hours total between 2x/day, 7 days/week Prosthetic weight bearing tolerance: 5 minutes with partial weight on prosthesis with limb pain 8/10 Edema: pitting Residual limb condition: no open areas but one  small area distal tibia that may be internal suture working out,  shiny skin with no hair growth, redness over patella & distal tibia, no temperature issues noted. Cylindrical shape Prosthetic description: silicon liner with pin lock suspension, total contact socket with flexible inner socket, dynamic response foot/ankle       TODAY'S TREATMENT:                                                                                                                             DATE:  04/28/2022: Neuromuscular Re-education: PT assessed vertigo:  Brayton Caves  - left cervical rotation sit to supine no nystagmus, right rotation no dizziness and rolling right. -right cervical rotation sit to supine no nystagmus, left rotation slight dizziness and rolling left slight dizziness. Nestor Lewandowsky To right light dizziness sit to supine & severe dizziness sitting up.  To left light dizziness sit to supine & severe dizziness sitting up.  Orthostatic Hypotension Supine no dizziness  145/70 HR 80  Standing light dizziness 119/67 HR 108 After standing 3 min  no dizziness BP 158/76 HR 112 Positive for orthostatic Hypotension  PT educated pt on orthostatic hypotension including dehydration signs/symptoms.  See pt instructions.    Standing in corner on floor with feet hip width eyes open head motion right/left, up/down & diagonals.  Gait with RW with head turns right/straight/left for 2 steps ea direction.     04/23/2022: Neuromuscular Re-ed:  Working on balance & proprioception using sock-limb interface  Do each exercise 1-2  times per day Do each exercise 5-10 repetitions Hold each exercise for 2 seconds to feel your location  AT [place].  Try to find this position when standing still for activities.   USE TAPE ON FLOOR TO MARK THE MIDLINE POSITION which is even with middle of sink.  You also should try to feel with your limb pressure in  socket.  You are trying to feel with limb what you used to feel with the bottom of your foot.  Side to Side Shift: Moving your hips only (not shoulders): move weight onto your left leg, HOLD/FEEL pressure in socket.  Move back to equal weight on each leg, HOLD/FEEL pressure in socket. Move weight onto your right leg, HOLD/FEEL pressure in socket. Move back to equal weight on each leg, HOLD/FEEL pressure in socket. Repeat.  Start with both hands on sink, progress to hand on prosthetic side only, then no hands.  Front to Back Shift: Moving your hips only (not shoulders): move your weight forward onto your toes, HOLD/FEEL pressure in socket. Move your weight back to equal Flat Foot on both legs, HOLD/FEEL  pressure in socket. Move your weight back onto your heels, HOLD/FEEL  pressure in socket. Move your weight back to equal on both legs, HOLD/FEEL  pressure in socket. Repeat.  Start with both hands on sink, progress to hand on prosthetic side only, then no hands.  Moving Cones / Cups: With equal weight on each leg: Hold on with one hand the first time, then progress to no hand supports. Move cups from one side of sink to the other. Place cups ~2" out of your reach, progress to 10" beyond reach.  Place one hand in middle of sink and reach with other hand. Do both arms.  Then hover one hand and move cups with other hand.  Overhead/Upward Reaching: alternated reaching up to top cabinets or ceiling if no cabinets present. Keep equal weight on each leg. Start with one hand support on counter while other hand reaches and progress to no hand support with reaching.  ace one hand in middle of sink and reach with other hand. Do both arms.  Then hover one hand and move cups with other hand.  5.   Looking Over Shoulders: With equal weight on each leg: alternate turning to look over your shoulders with one hand support on counter as needed.  Start with head motions only to look in front of shoulder, then even with shoulder  and progress to looking behind you. To look to side, move head /eyes, then shoulder on side looking pulls back, shift more weight to side looking and pull hip back. Place one hand in middle of sink and let go with other hand so your shoulder can pull back. Switch  hands to look other way.   Then hover one hand and look over shoulder. If looking right, use left hand at sink. If looking left, use right hand at sink. 6.  Stepping with leg that is not amputated:  Move items under cabinet out of your way. Shift your hips/pelvis so weight on prosthesis. Tighten muscles in hip on prosthetic side.  SLOWLY step other leg so front of foot is in cabinet. Then step back to floor.   Prosthetic Training with Transtibial Prosthesis: PT instructed adjusting ply socks with too few, too many & correct ply fit with verbal, demo, tactile & HO cues.  Pt verbalized understanding.   Hanger Socks: 1-ply is yellow color at top, 3-ply is green at top, 5-ply is navy blue at top How many ply you need depends on your limb size.  You should have even pressure on your limb when standing & walking.  Guidance points: 1. How ease it goes on? Should be some resistance. Too few it goes on too easily. Too many it takes a lot of work to get it on. 2. How many clicks you get. Especially clicks in sitting. 3. After standing or walking, check knee cap. Bottom should be just under the front lip.  Too few bottom of knee cap sits on indention. Too many bottom is above front lip.    Get ply socks correct before you leave the house. Take extra socks with you. Take one 3-ply and two 1-ply with you. This is in addition to what you are wearing.   04/21/2022   PATIENT EDUCATION: PATIENT EDUCATED ON FOLLOWING PROSTHETIC CARE: Education details: PT spent increased time with donning liner & socket.    Skin check, Correct ply sock adjustment, and [place] donning Person educated: Patient Education method: Explanation, Demonstration, Tactile cues, and  Verbal cues Education comprehension: verbalized understanding, returned demonstration, verbal cues required, tactile cues required, and needs further education   HOME EXERCISE PROGRAM: Do each exercise 5-10 repetitions Hold each exercise for 2 seconds to feel your location  AT [place].  Try to find this position when standing still for activities.   USE TAPE ON FLOOR TO MARK THE MIDLINE POSITION which is even with middle of sink.  You also should try to feel with your limb pressure in socket.  You are trying to feel with limb what you used to feel with the bottom of your foot.  Side to Side Shift: Moving your hips only (not shoulders): move weight onto your left leg, HOLD/FEEL pressure in socket.  Move back to equal weight on each leg, HOLD/FEEL pressure in socket. Move weight onto your right leg, HOLD/FEEL pressure in socket. Move back to equal weight on each leg, HOLD/FEEL pressure in socket. Repeat.  Start with both hands on sink, progress to hand on prosthetic side only, then no hands.  Front to Back Shift: Moving your hips only (not shoulders): move your weight forward onto your toes, HOLD/FEEL pressure in socket. Move your weight back to equal Flat Foot on both legs, HOLD/FEEL  pressure in socket. Move your weight back onto your heels, HOLD/FEEL  pressure in socket. Move your weight back to equal on both legs, HOLD/FEEL  pressure in socket. Repeat.  Start with both hands on sink, progress to hand on prosthetic side only, then no hands.  Moving Cones / Cups: With equal weight on each leg: Hold on with one hand the first  time, then progress to no hand supports. Move cups from one side of sink to the other. Place cups ~2" out of your reach, progress to 10" beyond reach.  Place one hand in middle of sink and reach with other hand. Do both arms.  Then hover one hand and move cups with other hand.  Overhead/Upward Reaching:  alternated reaching up to top cabinets or ceiling if no cabinets present. Keep equal weight on each leg. Start with one hand support on counter while other hand reaches and progress to no hand support with reaching.  ace one hand in middle of sink and reach with other hand. Do both arms.  Then hover one hand and move cups with other hand.  5.   Looking Over Shoulders: With equal weight on each leg: alternate turning to look over your shoulders with one hand support on counter as needed.  Start with head motions only to look in front of shoulder, then even with shoulder and progress to looking behind you. To look to side, move head /eyes, then shoulder on side looking pulls back, shift more weight to side looking and pull hip back. Place one hand in middle of sink and let go with other hand so your shoulder can pull back. Switch hands to look other way.   Then hover one hand and look over shoulder. If looking right, use left hand at sink. If looking left, use right hand at sink. 6.  Stepping with leg that is not amputated:  Move items under cabinet out of your way. Shift your hips/pelvis so weight on prosthesis. Tighten muscles in hip on prosthetic side.  SLOWLY step other leg so front of foot is in cabinet. Then step back to floor.     ASSESSMENT:  CLINICAL IMPRESSION: Patient's BP & HR changes were positive for Orthostatic Hypotension. Pt appears to understand HEP for vertigo of sit to/from sidelying with head rotated both right & left and corner standing on floor eyes open head motions.     OBJECTIVE IMPAIRMENTS: Abnormal gait, decreased activity tolerance, decreased balance, decreased endurance, decreased knowledge of condition, decreased knowledge of use of DME, decreased mobility, difficulty walking, decreased ROM, decreased strength, increased edema, impaired flexibility, postural dysfunction, prosthetic dependency , obesity, and pain.    ACTIVITY LIMITATIONS: carrying, lifting, bending, standing,  stairs, transfers, and locomotion level   PARTICIPATION LIMITATIONS: meal prep, cleaning, laundry, driving, community activity, and church   PERSONAL FACTORS: Fitness, Time since onset of injury/illness/exacerbation, and 3+ comorbidities: see PMH  are also affecting patient's functional outcome.    REHAB POTENTIAL: Good   CLINICAL DECISION MAKING: Evolving/moderate complexity   EVALUATION COMPLEXITY: Moderate     GOALS: Goals reviewed with patient? Yes   SHORT TERM GOALS: Target date: 05/22/2022   Patient donnes prosthesis modified independent & verbalizes proper cleaning. Baseline: SEE OBJECTIVE DATA Goal status: INITIAL 2.  Patient tolerates prosthesis >12 hrs total /day without skin issues or limb pain <5/10 after standing. Baseline: SEE OBJECTIVE DATA Goal status: INITIAL   3. Berg Balance >/= 23/56. Baseline: SEE OBJECTIVE DATA Goal status: INITIAL   4. Patient ambulates 64' with RW & prosthesis with supervision. Baseline: SEE OBJECTIVE DATA Goal status: INITIAL   5. Patient negotiates ramps & curbs with RW & prosthesis with supervision. Baseline: SEE OBJECTIVE DATA Goal status: INITIAL   LONG TERM GOALS: Target date: 07/17/2022   Patient demonstrates & verbalized understanding of prosthetic care to enable safe utilization of prosthesis. Baseline: SEE OBJECTIVE DATA Goal  status: INITIAL   Patient tolerates prosthesis wear >90% of awake hours without skin or limb pain issues. Baseline: SEE OBJECTIVE DATA Goal status: INITIAL   Berg Balance >36/56 to indicate lower fall risk Baseline: SEE OBJECTIVE DATA Goal status: INITIAL   Patient ambulates >300' with prosthesis & LRADindependently Baseline: SEE OBJECTIVE DATA Goal status: INITIAL   Patient negotiates ramps, curbs with LRAD & stairs with single rail with prosthesis independently. Baseline: SEE OBJECTIVE DATA Goal status: INITIAL   Patient verbalizes & demonstrates understanding of HEP Baseline: SEE  OBJECTIVE DATA Goal status: INITIAL   PLAN:   PT FREQUENCY: 2x/week   PT DURATION: 12 weeks   PLANNED INTERVENTIONS: Therapeutic exercises, Therapeutic activity, Neuromuscular re-education, Balance training, Gait training, Patient/Family education, Self Care, Stair training, Vestibular training, Canalith repositioning, Prosthetic training, DME instructions, and physical performance training   PLAN FOR NEXT SESSION: progress vertigo exercises to include eyes closed & foam,  Instruct in ramp & curb negotiation. Check vertigo.    Jamey Reas, PT, DPT 04/28/2022, 4:08 PM

## 2022-04-30 ENCOUNTER — Ambulatory Visit (INDEPENDENT_AMBULATORY_CARE_PROVIDER_SITE_OTHER): Payer: Medicare Other | Admitting: Physical Therapy

## 2022-04-30 ENCOUNTER — Encounter: Payer: Self-pay | Admitting: Physical Therapy

## 2022-04-30 DIAGNOSIS — R42 Dizziness and giddiness: Secondary | ICD-10-CM

## 2022-04-30 DIAGNOSIS — M6281 Muscle weakness (generalized): Secondary | ICD-10-CM | POA: Diagnosis not present

## 2022-04-30 DIAGNOSIS — R2681 Unsteadiness on feet: Secondary | ICD-10-CM

## 2022-04-30 DIAGNOSIS — R2689 Other abnormalities of gait and mobility: Secondary | ICD-10-CM

## 2022-04-30 DIAGNOSIS — M79662 Pain in left lower leg: Secondary | ICD-10-CM | POA: Diagnosis not present

## 2022-04-30 DIAGNOSIS — R29898 Other symptoms and signs involving the musculoskeletal system: Secondary | ICD-10-CM

## 2022-04-30 NOTE — Therapy (Signed)
OUTPATIENT PHYSICAL THERAPY TREATMENT NOTE   Patient Name: Tammy Good MRN: [id number] DOB:1950-11-08, 72 y.o., female Today's Date: 04/30/2022  PCP: general provider REFERRING PROVIDER: Melony Overly, MD   END OF SESSION:   PT End of Session - 04/30/22 1343     Visit Number 4    Number of Visits 25    Date for PT Re-Evaluation 07/17/22    Authorization Type Medicare & BCBS state health PPO    Progress Note Due on Visit 10    PT Start Time 1345    PT Stop Time 1430    PT Time Calculation (min) 45 min    Equipment Utilized During Treatment Gait belt    Activity Tolerance Patient tolerated treatment well;Patient limited by pain    Behavior During Therapy WFL for tasks assessed/performed              Past Medical History:  Diagnosis Date   CAD S/P percutaneous coronary angioplasty 08/2013   100% mRCA - PCI Integrity Resolute DES 3.0 mm x 38 mm - 3.35 mm; PTCA of RPA V 2.0 mm x 15 mm   CHF (congestive heart failure) ([place])    Cholesteatoma    right   Diabetes mellitus type 2 in obese (HCC)    On insulin and Invokana   History of osteomyelitis L 5th Toe all 05/2019   s/p Partial Ray Amputation with partial closure; 6 wks Abx & LSFA Atherectomy/DEB PTA with Stent for focal dissection.   Hyperlipidemia with target LDL less than 70    Hypothyroidism (acquired)    Mild essential hypertension    Obesity (BMI 30-39.9) 11/17/2013   PAD (peripheral artery disease) ([place]) 05/26/2019   05/26/19: Abd AoGram- BLE runoff -> L SFA orbital atherectomy - PTA w/ DEB & Stent 6 x 40 Luttonix (for focal dissection) - patent Pop A with 3 V runoff. LEA Dopplers 01/03/2020: RABI (prev) 0.68 (0.53)/ RTBI (prev) 0.58 (0.33); LABI (prev) 0.80 (0.64), LTBI (prev) 0.64 (0.51); R mSFA ~50-74%, L mSFA 50-74%. Patent Prox SFA stent < 49% stenosis   Panhypopituitarism (HCC)    ST elevation myocardial infarction (STEMI) of inferior wall, subsequent episode of care ([place]) 08/2013   80% branch of D1,  40% mid AV groove circumflex, 100% RCA with subacute thrombus -- thrombus extending into RPA V with 100% occlusion after initial angioplasty of mid RCA ;; Post MI ECHO 6/9/'15: EF 50-55%, mild LVH with moderate HK of inferior wall, Gr1 DD, mild LA dilation; mildly reduced RV function   Past Surgical History:  Procedure Laterality Date   ABDOMINAL AORTOGRAM W/LOWER EXTREMITY N/A 05/26/2019   Procedure: ABDOMINAL AORTOGRAM W/LOWER EXTREMITY;  Surgeon: Marty Heck, MD;  Location: [place];  Service: Cardiovascular;  Laterality: N/A;   ABDOMINAL AORTOGRAM W/LOWER EXTREMITY N/A 11/15/2020   Procedure: ABDOMINAL AORTOGRAM W/LOWER EXTREMITY;  Surgeon: Marty Heck, MD;  Location: [place] [facility];  Service: Cardiovascular;  Laterality: N/A;   ABDOMINAL AORTOGRAM W/LOWER EXTREMITY N/A 04/18/2021   Procedure: ABDOMINAL AORTOGRAM W/LOWER EXTREMITY;  Surgeon: Marty Heck, MD;  Location: [place] [facility];  Service: Cardiovascular;  Laterality: N/A;   ABDOMINAL AORTOGRAM W/LOWER EXTREMITY Left 07/11/2021   Procedure: ABDOMINAL AORTOGRAM W/LOWER EXTREMITY;  Surgeon: Marty Heck, MD;  Location: [facility];  Service: Cardiovascular;  Laterality: Left;   AMPUTATION Left 05/25/2019   Procedure: AMPUTATION RAY 5th;  Surgeon: Trula Slade, DPM;  Location: [place];  Service: Podiatry;  Laterality: Left;  AMPUTATION Left 01/14/2022   Procedure: LEFT BELOW THE KNEE AMPUTATION;  Surgeon: Erle Crocker, MD;  Location: [place];  Service: Orthopedics;  Laterality: Left;  LENGTH OF SURGERY: 90 MINUTES   APPLICATION OF WOUND VAC Left 01/14/2022   Procedure: WOUND VAC PLACEMENT;  Surgeon: Erle Crocker, MD;  Location: [place];  Service: Orthopedics;  Laterality: Left;   BONE BIOPSY Left 04/24/2021   Procedure: BONE BIOPSY;  Surgeon: Landis Martins, DPM;  Location: [place];  Service: Podiatry;  Laterality: Left;  left ankle/fibula   BONE BIOPSY Left  07/15/2021   Procedure: LEFT FOOT BONE BIOPSY;  Surgeon: Trula Slade, DPM;  Location: [facility] [place];  Service: Podiatry;  Laterality: Left;   Cardiac Event Monitor  July-August 2015   Sinus rhythm with PVCs   CHOLECYSTECTOMY     COLONOSCOPY N/A 08/31/2013   Procedure: COLONOSCOPY;  Surgeon: Juanita Craver, MD;  Location: [facility];  Service: Endoscopy;  Laterality: N/A;   CORONARY STENT INTERVENTION N/A 11/19/2020   Procedure: CORONARY STENT INTERVENTION;  Surgeon: Nelva Bush, MD;  Location: [facility];  Service: Cardiovascular;  Laterality: N/A;   ESOPHAGOGASTRODUODENOSCOPY N/A 09/01/2013   Procedure: ESOPHAGOGASTRODUODENOSCOPY (EGD);  Surgeon: Beryle Beams, MD;  Location: [facility] ENDOSCOPY;  Service: Endoscopy;  Laterality: N/A;  bedside   ESOPHAGOGASTRODUODENOSCOPY (EGD) WITH PROPOFOL N/A 05/26/2021   Procedure: ESOPHAGOGASTRODUODENOSCOPY (EGD) WITH PROPOFOL;  Surgeon: Carol Ada, MD;  Location: [place];  Service: Gastroenterology;  Laterality: N/A;   EYE SURGERY Bilateral    bilateral cataracts   INCISION AND DRAINAGE Left 04/24/2021   Procedure: INCISION AND DRAINAGE;  Surgeon: Landis Martins, DPM;  Location: [place];  Service: Podiatry;  Laterality: Left;   IRRIGATION AND DEBRIDEMENT FOOT Left 07/15/2021   Procedure: IRRIGATION AND DEBRIDEMENT FOOT;  Surgeon: Trula Slade, DPM;  Location: [place];  Service: Podiatry;  Laterality: Left;   LEFT HEART CATH AND CORONARY ANGIOGRAPHY N/A 11/19/2020   Procedure: LEFT HEART CATH AND CORONARY ANGIOGRAPHY;  Surgeon: Nelva Bush, MD;  Location: [facility];  Service: Cardiovascular;  Laterality: N/A;   LEFT HEART CATHETERIZATION WITH CORONARY ANGIOGRAM N/A 08/30/2013   Procedure: LEFT HEART CATHETERIZATION WITH CORONARY ANGIOGRAM;  Surgeon: Leonie Man, MD;  Location: [facility] CATH LAB: 100% mRCA (thrombus - extends to RPAV), 80% D1, 40% AVG Cx.   PERCUTANEOUS CORONARY STENT INTERVENTION (PCI-S)  08/30/2013    Procedure: PERCUTANEOUS CORONARY STENT INTERVENTION (PCI-S);  Surgeon: Leonie Man, MD;  Location: [facility] CATH LAB;  Integrity Resolute DES 2.0 mm x 38 mm -- 3.35 mm.; PTCA of proximal RPA V. - 3.0 mm x 15 mm balloon   PERIPHERAL VASCULAR ATHERECTOMY  05/26/2019   Procedure: PERIPHERAL VASCULAR ATHERECTOMY;  Surgeon: Marty Heck, MD;  Location: [facility] [facility];  Service: Cardiovascular;;  Left SFA   PERIPHERAL VASCULAR BALLOON ANGIOPLASTY Left 07/11/2021   Procedure: PERIPHERAL VASCULAR BALLOON ANGIOPLASTY;  Surgeon: Marty Heck, MD;  Location: [place] [facility];  Service: Cardiovascular;  Laterality: Left;  PT TRUNK / AT   PERIPHERAL VASCULAR INTERVENTION  05/26/2019   Procedure: PERIPHERAL VASCULAR INTERVENTION;  Surgeon: Marty Heck, MD;  Location: [place] [facility];  Service: Cardiovascular;;  Left SFA   PERIPHERAL VASCULAR INTERVENTION Right 11/15/2020   Procedure: PERIPHERAL VASCULAR INTERVENTION;  Surgeon: Marty Heck, MD;  Location: [place] [facility];  Service: Cardiovascular;  Laterality: Right;  Superficial Femoral Artery   PERIPHERAL VASCULAR INTERVENTION Left 07/11/2021   Procedure: PERIPHERAL VASCULAR  INTERVENTION;  Surgeon: Marty Heck, MD;  Location: [place] [facility];  Service: Cardiovascular;  Laterality: Left;  SFA   PITUITARY SURGERY     TEE WITHOUT CARDIOVERSION N/A 04/26/2021   Procedure: TRANSESOPHAGEAL ECHOCARDIOGRAM (TEE);  Surgeon: Buford Dresser, MD;  Location: [facility] ENDOSCOPY;  Service: Cardiovascular;  Laterality: N/A;   TRANSTHORACIC ECHOCARDIOGRAM  08/30/2013   mild LVH. EF 50-55%. Moderate HK of the entire inferior myocardium. GR 1 DD. Mild LA dilation. Mildly reduced RV function   TYMPANOMASTOIDECTOMY Right 12/28/2017   Procedure: RIGHT TYMPANOMASTOIDECTOMY;  Surgeon: Leta Baptist, MD;  Location: [place];  Service: ENT;  Laterality: Right;   Patient Active Problem List   Diagnosis Date Noted    Nocturia more than twice per night 02/03/2022   Unilateral complete BKA, left, initial encounter ([place]) 01/17/2022   Osteomyelitis ([place]) 01/13/2022   Normocytic anemia 01/13/2022   Atrial fibrillation ([place]) 08/15/2021   Septic arthritis of foot ([place]) 08/02/2021   Medication monitoring encounter 08/02/2021   Acute on chronic systolic CHF (congestive heart failure) ([place]) 06/02/2021   Hypothyroidism 06/02/2021   Ulcer of esophagus without bleeding    Pressure injury of skin 05/31/2021   Hypokalemia 05/30/2021   Symptomatic anemia 05/29/2021   AKI (acute kidney injury) ([place]) 05/29/2021   GI bleed 05/29/2021   Liver lesion 05/29/2021   Vertigo 05/24/2021   Generalized weakness 05/24/2021   Tenosynovitis of left ankle 04/25/2021   Thrombocytosis 04/23/2021   Fatty liver 06/19/2020   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 ([place]) 19/41/7408   Atherosclerotic heart disease of native coronary artery without angina pectoris 03/20/2020   Epigastric pain 03/20/2020   Nausea 03/20/2020   Absence of toe ([place]) 06/28/2019   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   PAD (peripheral artery disease) ([place]) 05/26/2019   Osteomyelitis of left foot ([place]) 05/24/2019   Cellulitis and abscess of foot, except toes 05/18/2019   Chronic osteomyelitis of ankle and foot ([place]) 05/18/2019   Cholesteatoma 02/24/2019   Cellulitis and abscess of toe 11/09/2018   Colon cancer screening 11/30/2017   Long term (current) use of insulin ([place]) 03/05/2017   Iron deficiency anemia 12/10/2014   Obesity (BMI 30-39.9) 11/17/2013   Diabetes mellitus, type 2 ([place])    Essential hypertension    Right thigh pain 11/01/2013   PVC's (premature ventricular contractions) 11/01/2013   Status post insertion of drug eluting coronary artery stent to [facility] emergently and PTCA to prox. PLA 09/16/2013   Hyperlipidemia associated with type 2 diabetes mellitus ([place]) 09/16/2013   Presence of coronary angioplasty  implant and graft 09/08/2013   Panhypopituitarism ([place]) 08/30/2013   Non-ST elevation (NSTEMI) myocardial infarction ([place]) 08/22/2013   CAD S/P percutaneous coronary angioplasty 08/22/2013    REFERRING DIAG: X44.818 (ICD-10-CM) - Hx of BKA, left    ONSET DATE:  03/19/2022 prosthesis delivery   THERAPY DIAG:  Other abnormalities of gait and mobility  Unsteadiness on feet  Pain in left lower leg  Muscle weakness (generalized)  Dizziness and giddiness  Other symptoms and signs involving the musculoskeletal system  Rationale for Evaluation and Treatment Rehabilitation  PERTINENT HISTORY: CAD, STEMI, CHF, PAD, A-Fib, IDDM2, severe septic arthritis   PRECAUTIONS: Fall  SUBJECTIVE:  SUBJECTIVE STATEMENT: She has been doing the laying right & left with less dizziness but not certain of corner exercises.    PAIN:  Are you having pain? Yes: NPRS scale:  today 0/10 and in last week 1/10-8/10 Pain location: left distal tibia, posterior knee & distal limb.  Pain description: sore Aggravating factors: walking & standing.  Relieving factors: taking prosthesis off   Back pain lumbar: 0/10 today              If walks >50-75' her back flares up.    OBJECTIVE: (objective measures completed at initial evaluation unless otherwise dated)  COGNITION: Overall cognitive status: Within functional limits for tasks assessed   POSTURE: rounded shoulders, forward head, flexed trunk , and weight shift right   LOWER EXTREMITY ROM:   ROM P:passive  A:active Right eval Left eval  Hip flexion   WFL  Hip extension   [facility]  Hip abduction   WFL  Hip adduction      Hip internal rotation      Hip external rotation      Knee flexion   90*  Knee extension   -5*  Ankle dorsiflexion   NA  Ankle plantarflexion   NA  Ankle  inversion   NA  Ankle eversion   NA   (Blank rows = not tested)   LOWER EXTREMITY MMT:   MMT Right eval Left eval  Hip flexion   4/5  Hip extension   3+/5  Hip abduction   3+/5  Hip adduction      Hip internal rotation      Hip external rotation      Knee flexion   3+/5  Knee extension   4-/5  Ankle dorsiflexion   NA  Ankle plantarflexion   NA  Ankle inversion   NA  Ankle eversion   NA  (Blank rows = not tested)   TRANSFERS: Sit to stand: SBA requires armrests & BUEs to push up but able to stabilize without touching RW Stand to sit: SBA requires armrests & BUEs to control descent.    GAIT: Gait pattern: step to pattern, decreased step length- Right, decreased stance time- Left, decreased hip/knee flexion- Left, circumduction- Left, Left hip hike, knee flexed in stance- Left, antalgic, lateral hip instability, trunk flexed, and abducted- Left Distance walked: 20' Assistive device utilized: Environmental consultant - 2 wheeled and TTA prosthesis Level of assistance: SBA  verbal cues only with RW Comments: excessive UE weight bearing on RW   FUNCTIONAL TESTs:  04/28/2022:  PT assessed vertigo:  Brayton Caves  - left cervical rotation sit to supine no nystagmus, right rotation no dizziness and rolling right. -right cervical rotation sit to supine no nystagmus, left rotation slight dizziness and rolling left slight dizziness. Nestor Lewandowsky To right light dizziness sit to supine & severe dizziness sitting up. To left light dizziness sit to supine & severe dizziness sitting up.  Orthostatic Hypotension Supine no dizziness  145/70 HR 80  Standing light dizziness 119/67 HR 108 After standing 3 min  no dizziness BP 158/76 HR 112 Positive for orthostatic Hypotension   04/21/2022: Dizziness with head motions.  Berg Balance Scale: 19/56   [facility] PT Assessment - 04/21/22 1505                Standardized Balance Assessment    Standardized Balance Assessment Berg Balance Test          Berg  Balance Test    Sit to Stand Able to  stand  independently using hands     Standing Unsupported Able to stand 2 minutes with supervision     Sitting with Back Unsupported but Feet Supported on Floor or Stool Able to sit safely and securely 2 minutes     Stand to Sit Controls descent by using hands     Transfers Able to transfer safely, definite need of hands     Standing Unsupported with Eyes Closed Needs help to keep from falling     Standing Unsupported with Feet Together Needs help to attain position and unable to hold for 15 seconds     From Standing, Reach Forward with Outstretched Arm Reaches forward but needs supervision     From Standing Position, Pick up Object from Floor Unable to pick up and needs supervision     From Standing Position, Turn to Look Behind Over each Shoulder Needs supervision when turning     Turn 360 Degrees Needs assistance while turning     Standing Unsupported, Alternately Place Feet on Step/Stool Needs assistance to keep from falling or unable to try     Standing Unsupported, One Foot in [employer] balance while stepping or standing     Standing on One Leg Unable to try or needs assist to prevent fall     Total Score 19     Berg comment: BERG  < 36 high risk for falls (close to 100%) 46-51 moderate (>50%)   37-45 significant (>80%) 52-55 lower (> 25%)                     CURRENT PROSTHETIC WEAR ASSESSMENT:  Patient is independent with: prosthetic cleaning Patient is dependent with: skin check, residual limb care, correct ply sock adjustment, proper wear schedule/adjustment, and proper weight-bearing schedule/adjustment Donning prosthesis: SBA Doffing prosthesis: Modified independence Prosthetic wear tolerance: ~14 hours total between 2x/day, 7 days/week Prosthetic weight bearing tolerance: 5 minutes with partial weight on prosthesis with limb pain 8/10 Edema: pitting Residual limb condition: no open areas but one small area distal tibia that may be  internal suture working out,  shiny skin with no hair growth, redness over patella & distal tibia, no temperature issues noted. Cylindrical shape Prosthetic description: silicon liner with pin lock suspension, total contact socket with flexible inner socket, dynamic response foot/ankle       TODAY'S TREATMENT:                                                                                                                             DATE:  04/30/2022: Prosthetic Training with Transtibial Prosthesis: PT reviewed recommendation for wear from arising to bedtime.  Washing liner around dinner time.  PT reviewed adjusting ply socks.  PT instructed in fall risk & stress to back/RLE when prosthesis is off. Pt verbalized understanding.  PT instructed in using 24" bar stool for ADLs & building trunk endurance. Pt verbalized understanding.   Neuromuscular Re-education: In corner with  RW in front for safety:  -on floor eyes open hip width stance head motions right/left, up/down & diagonals 5 reps ea. -on floor eyes closed hip width stance head motions right/left, up/down & diagonals 5 reps ea. -on foam eyes open hip width stance head motions right/left, up/down & diagonals 5 reps ea. PT instructed in above as HEP with demo, verbal & HO cues. Pt verbalized understanding.    04/28/2022: Neuromuscular Re-education: PT assessed vertigo:  Brayton Caves  - left cervical rotation sit to supine no nystagmus, right rotation no dizziness and rolling right. -right cervical rotation sit to supine no nystagmus, left rotation slight dizziness and rolling left slight dizziness. Nestor Lewandowsky To right light dizziness sit to supine & severe dizziness sitting up. To left light dizziness sit to supine & severe dizziness sitting up.  Orthostatic Hypotension Supine no dizziness  145/70 HR 80  Standing light dizziness 119/67 HR 108 After standing 3 min  no dizziness BP 158/76 HR 112 Positive for orthostatic  Hypotension  PT educated pt on orthostatic hypotension including dehydration signs/symptoms.  See pt instructions.    Standing in corner on floor with feet hip width eyes open head motion right/left, up/down & diagonals.  Gait with RW with head turns right/straight/left for 2 steps ea direction.     04/23/2022: Neuromuscular Re-ed:  Working on balance & proprioception using sock-limb interface  Do each exercise 1-2  times per day Do each exercise 5-10 repetitions Hold each exercise for 2 seconds to feel your location  AT [place].  Try to find this position when standing still for activities.   USE TAPE ON FLOOR TO MARK THE MIDLINE POSITION which is even with middle of sink.  You also should try to feel with your limb pressure in socket.  You are trying to feel with limb what you used to feel with the bottom of your foot.  Side to Side Shift: Moving your hips only (not shoulders): move weight onto your left leg, HOLD/FEEL pressure in socket.  Move back to equal weight on each leg, HOLD/FEEL pressure in socket. Move weight onto your right leg, HOLD/FEEL pressure in socket. Move back to equal weight on each leg, HOLD/FEEL pressure in socket. Repeat.  Start with both hands on sink, progress to hand on prosthetic side only, then no hands.  Front to Back Shift: Moving your hips only (not shoulders): move your weight forward onto your toes, HOLD/FEEL pressure in socket. Move your weight back to equal Flat Foot on both legs, HOLD/FEEL  pressure in socket. Move your weight back onto your heels, HOLD/FEEL  pressure in socket. Move your weight back to equal on both legs, HOLD/FEEL  pressure in socket. Repeat.  Start with both hands on sink, progress to hand on prosthetic side only, then no hands.  Moving Cones / Cups: With equal weight on each leg: Hold on with one hand the first time, then progress to no hand supports. Move cups  from one side of sink to the other. Place cups ~2" out of your reach, progress to 10" beyond reach.  Place one hand in middle of sink and reach with other hand. Do both arms.  Then hover one hand and move cups with other hand.  Overhead/Upward Reaching: alternated reaching up to top cabinets or ceiling if no cabinets present. Keep equal weight on each leg. Start with one hand support on counter while other hand reaches  and progress to no hand support with reaching.  ace one hand in middle of sink and reach with other hand. Do both arms.  Then hover one hand and move cups with other hand.  5.   Looking Over Shoulders: With equal weight on each leg: alternate turning to look over your shoulders with one hand support on counter as needed.  Start with head motions only to look in front of shoulder, then even with shoulder and progress to looking behind you. To look to side, move head /eyes, then shoulder on side looking pulls back, shift more weight to side looking and pull hip back. Place one hand in middle of sink and let go with other hand so your shoulder can pull back. Switch hands to look other way.   Then hover one hand and look over shoulder. If looking right, use left hand at sink. If looking left, use right hand at sink. 6.  Stepping with leg that is not amputated:  Move items under cabinet out of your way. Shift your hips/pelvis so weight on prosthesis. Tighten muscles in hip on prosthetic side.  SLOWLY step other leg so front of foot is in cabinet. Then step back to floor.   Prosthetic Training with Transtibial Prosthesis: PT instructed adjusting ply socks with too few, too many & correct ply fit with verbal, demo, tactile & HO cues.  Pt verbalized understanding.   Hanger Socks: 1-ply is yellow color at top, 3-ply is green at top, 5-ply is navy blue at top How many ply you need depends on your limb size.  You should have even pressure on your limb when standing & walking.  Guidance points: 1. How  ease it goes on? Should be some resistance. Too few it goes on too easily. Too many it takes a lot of work to get it on. 2. How many clicks you get. Especially clicks in sitting. 3. After standing or walking, check knee cap. Bottom should be just under the front lip.  Too few bottom of knee cap sits on indention. Too many bottom is above front lip.    Get ply socks correct before you leave the house. Take extra socks with you. Take one 3-ply and two 1-ply with you. This is in addition to what you are wearing.    HOME EXERCISE PROGRAM: Access Code: [email or web address] URL: [email or web address] Date: 04/30/2022 Prepared by: Jamey Reas  Exercises - wide stance head motions eyes open  - 1 x daily - 4 x weekly - 1 sets - 10 reps - 2 seconds hold - Feet Apart with Eyes Closed with Head Motions  - 1 x daily - 4 x weekly - 1 sets - 10 reps - 2 seconds hold - stand on foam eyes open   - 1 x daily - 4 x weekly - 1 sets - 10 reps - 2 seconds hold    Do each exercise 5-10 repetitions Hold each exercise for 2 seconds to feel your location  AT [place].  Try to find this position when standing still for activities.   USE TAPE ON FLOOR TO MARK THE MIDLINE POSITION which is even with middle of sink.  You also should try to feel with your limb pressure in socket.  You are trying to feel with limb what you used to feel with the bottom of your foot.  Side to Side Shift: Moving your hips only (not shoulders): move  weight onto your left leg, HOLD/FEEL pressure in socket.  Move back to equal weight on each leg, HOLD/FEEL pressure in socket. Move weight onto your right leg, HOLD/FEEL pressure in socket. Move back to equal weight on each leg, HOLD/FEEL pressure in socket. Repeat.  Start with both hands on sink, progress to hand on prosthetic side only, then no hands.  Front to Back Shift: Moving your hips only (not shoulders): move  your weight forward onto your toes, HOLD/FEEL pressure in socket. Move your weight back to equal Flat Foot on both legs, HOLD/FEEL  pressure in socket. Move your weight back onto your heels, HOLD/FEEL  pressure in socket. Move your weight back to equal on both legs, HOLD/FEEL  pressure in socket. Repeat.  Start with both hands on sink, progress to hand on prosthetic side only, then no hands.  Moving Cones / Cups: With equal weight on each leg: Hold on with one hand the first time, then progress to no hand supports. Move cups from one side of sink to the other. Place cups ~2" out of your reach, progress to 10" beyond reach.  Place one hand in middle of sink and reach with other hand. Do both arms.  Then hover one hand and move cups with other hand.  Overhead/Upward Reaching: alternated reaching up to top cabinets or ceiling if no cabinets present. Keep equal weight on each leg. Start with one hand support on counter while other hand reaches and progress to no hand support with reaching.  ace one hand in middle of sink and reach with other hand. Do both arms.  Then hover one hand and move cups with other hand.  5.   Looking Over Shoulders: With equal weight on each leg: alternate turning to look over your shoulders with one hand support on counter as needed.  Start with head motions only to look in front of shoulder, then even with shoulder and progress to looking behind you. To look to side, move head /eyes, then shoulder on side looking pulls back, shift more weight to side looking and pull hip back. Place one hand in middle of sink and let go with other hand so your shoulder can pull back. Switch hands to look other way.   Then hover one hand and look over shoulder. If looking right, use left hand at sink. If looking left, use right hand at sink. 6.  Stepping with leg that is not amputated:  Move items under cabinet out of your way. Shift your hips/pelvis so weight on prosthesis. Tighten muscles in hip on  prosthetic side.  SLOWLY step other leg so front of foot is in cabinet. Then step back to floor.     ASSESSMENT:  CLINICAL IMPRESSION: Patient appears to understand HEP in corner to work on balance & vestibular issues.  She also seems to understand use of 24" bar stool to work on upright trunk endurance & ADLs.  Pt is reporting less pain with prosthesis use.      OBJECTIVE IMPAIRMENTS: Abnormal gait, decreased activity tolerance, decreased balance, decreased endurance, decreased knowledge of condition, decreased knowledge of use of DME, decreased mobility, difficulty walking, decreased ROM, decreased strength, increased edema, impaired flexibility, postural dysfunction, prosthetic dependency , obesity, and pain.    ACTIVITY LIMITATIONS: carrying, lifting, bending, standing, stairs, transfers, and locomotion level   PARTICIPATION LIMITATIONS: meal prep, cleaning, laundry, driving, community activity, and church   PERSONAL FACTORS: Fitness, Time since onset of injury/illness/exacerbation, and 3+ comorbidities: see PMH  are also affecting patient's functional outcome.    REHAB POTENTIAL: Good   CLINICAL DECISION MAKING: Evolving/moderate complexity   EVALUATION COMPLEXITY: Moderate     GOALS: Goals reviewed with patient? Yes   SHORT TERM GOALS: Target date: 05/22/2022   Patient donnes prosthesis modified independent & verbalizes proper cleaning. Baseline: SEE OBJECTIVE DATA Goal status: INITIAL 2.  Patient tolerates prosthesis >12 hrs total /day without skin issues or limb pain <5/10 after standing. Baseline: SEE OBJECTIVE DATA Goal status: INITIAL   3. Berg Balance >/= 23/56. Baseline: SEE OBJECTIVE DATA Goal status: INITIAL   4. Patient ambulates 42' with RW & prosthesis with supervision. Baseline: SEE OBJECTIVE DATA Goal status: INITIAL   5. Patient negotiates ramps & curbs with RW & prosthesis with supervision. Baseline: SEE OBJECTIVE DATA Goal status: INITIAL   LONG  TERM GOALS: Target date: 07/17/2022   Patient demonstrates & verbalized understanding of prosthetic care to enable safe utilization of prosthesis. Baseline: SEE OBJECTIVE DATA Goal status: INITIAL   Patient tolerates prosthesis wear >90% of awake hours without skin or limb pain issues. Baseline: SEE OBJECTIVE DATA Goal status: INITIAL   Berg Balance >36/56 to indicate lower fall risk Baseline: SEE OBJECTIVE DATA Goal status: INITIAL   Patient ambulates >300' with prosthesis & LRADindependently Baseline: SEE OBJECTIVE DATA Goal status: INITIAL   Patient negotiates ramps, curbs with LRAD & stairs with single rail with prosthesis independently. Baseline: SEE OBJECTIVE DATA Goal status: INITIAL   Patient verbalizes & demonstrates understanding of HEP Baseline: SEE OBJECTIVE DATA Goal status: INITIAL   PLAN:   PT FREQUENCY: 2x/week   PT DURATION: 12 weeks   PLANNED INTERVENTIONS: Therapeutic exercises, Therapeutic activity, Neuromuscular re-education, Balance training, Gait training, Patient/Family education, Self Care, Stair training, Vestibular training, Canalith repositioning, Prosthetic training, DME instructions, and physical performance training   PLAN FOR NEXT SESSION: verbally check HEP & review as needed,   Instruct in ramp & curb negotiation. Gait with cane near counter / rail.    Jamey Reas, PT, DPT 04/30/2022, 4:36 PM

## 2022-05-05 ENCOUNTER — Encounter: Payer: Self-pay | Admitting: Physical Therapy

## 2022-05-05 ENCOUNTER — Ambulatory Visit (INDEPENDENT_AMBULATORY_CARE_PROVIDER_SITE_OTHER): Payer: Medicare Other | Admitting: Physical Therapy

## 2022-05-05 DIAGNOSIS — R2681 Unsteadiness on feet: Secondary | ICD-10-CM

## 2022-05-05 DIAGNOSIS — R2689 Other abnormalities of gait and mobility: Secondary | ICD-10-CM | POA: Diagnosis not present

## 2022-05-05 DIAGNOSIS — M79662 Pain in left lower leg: Secondary | ICD-10-CM

## 2022-05-05 DIAGNOSIS — R42 Dizziness and giddiness: Secondary | ICD-10-CM

## 2022-05-05 DIAGNOSIS — M6281 Muscle weakness (generalized): Secondary | ICD-10-CM

## 2022-05-05 DIAGNOSIS — R29898 Other symptoms and signs involving the musculoskeletal system: Secondary | ICD-10-CM

## 2022-05-05 NOTE — Therapy (Addendum)
OUTPATIENT PHYSICAL THERAPY TREATMENT NOTE   Patient Name: Tammy Good MRN: [id number] DOB:1951/03/15, 72 y.o., female Today's Date: 05/05/2022  PCP: general provider REFERRING PROVIDER: Melony Overly, MD   END OF SESSION:   PT End of Session - 05/05/22 1342     Visit Number 5    Number of Visits 25    Date for PT Re-Evaluation 07/17/22    Authorization Type Medicare & BCBS state health PPO    Progress Note Due on Visit 10    PT Start Time 1342    PT Stop Time 1425    PT Time Calculation (min) 43 min    Equipment Utilized During Treatment Gait belt    Activity Tolerance Patient tolerated treatment well;Patient limited by pain    Behavior During Therapy WFL for tasks assessed/performed               Past Medical History:  Diagnosis Date   CAD S/P percutaneous coronary angioplasty 08/2013   100% mRCA - PCI Integrity Resolute DES 3.0 mm x 38 mm - 3.35 mm; PTCA of RPA V 2.0 mm x 15 mm   CHF (congestive heart failure) ([place])    Cholesteatoma    right   Diabetes mellitus type 2 in obese (HCC)    On insulin and Invokana   History of osteomyelitis L 5th Toe all 05/2019   s/p Partial Ray Amputation with partial closure; 6 wks Abx & LSFA Atherectomy/DEB PTA with Stent for focal dissection.   Hyperlipidemia with target LDL less than 70    Hypothyroidism (acquired)    Mild essential hypertension    Obesity (BMI 30-39.9) 11/17/2013   PAD (peripheral artery disease) ([place]) 05/26/2019   05/26/19: Abd AoGram- BLE runoff -> L SFA orbital atherectomy - PTA w/ DEB & Stent 6 x 40 Luttonix (for focal dissection) - patent Pop A with 3 V runoff. LEA Dopplers 01/03/2020: RABI (prev) 0.68 (0.53)/ RTBI (prev) 0.58 (0.33); LABI (prev) 0.80 (0.64), LTBI (prev) 0.64 (0.51); R mSFA ~50-74%, L mSFA 50-74%. Patent Prox SFA stent < 49% stenosis   Panhypopituitarism (HCC)    ST elevation myocardial infarction (STEMI) of inferior wall, subsequent episode of care ([place]) 08/2013   80% branch of  D1, 40% mid AV groove circumflex, 100% RCA with subacute thrombus -- thrombus extending into RPA V with 100% occlusion after initial angioplasty of mid RCA ;; Post MI ECHO 6/9/'15: EF 50-55%, mild LVH with moderate HK of inferior wall, Gr1 DD, mild LA dilation; mildly reduced RV function   Past Surgical History:  Procedure Laterality Date   ABDOMINAL AORTOGRAM W/LOWER EXTREMITY N/A 05/26/2019   Procedure: ABDOMINAL AORTOGRAM W/LOWER EXTREMITY;  Surgeon: Marty Heck, MD;  Location: [place] [facility];  Service: Cardiovascular;  Laterality: N/A;   ABDOMINAL AORTOGRAM W/LOWER EXTREMITY N/A 11/15/2020   Procedure: ABDOMINAL AORTOGRAM W/LOWER EXTREMITY;  Surgeon: Marty Heck, MD;  Location: [place] [facility];  Service: Cardiovascular;  Laterality: N/A;   ABDOMINAL AORTOGRAM W/LOWER EXTREMITY N/A 04/18/2021   Procedure: ABDOMINAL AORTOGRAM W/LOWER EXTREMITY;  Surgeon: Marty Heck, MD;  Location: [place] [facility];  Service: Cardiovascular;  Laterality: N/A;   ABDOMINAL AORTOGRAM W/LOWER EXTREMITY Left 07/11/2021   Procedure: ABDOMINAL AORTOGRAM W/LOWER EXTREMITY;  Surgeon: Marty Heck, MD;  Location: [place] [facility];  Service: Cardiovascular;  Laterality: Left;   AMPUTATION Left 05/25/2019   Procedure: AMPUTATION RAY 5th;  Surgeon: Trula Slade, DPM;  Location: [place];  Service: Podiatry;  Laterality:  Left;   AMPUTATION Left 01/14/2022   Procedure: LEFT BELOW THE KNEE AMPUTATION;  Surgeon: Erle Crocker, MD;  Location: [place];  Service: Orthopedics;  Laterality: Left;  LENGTH OF SURGERY: 90 MINUTES   APPLICATION OF WOUND VAC Left 01/14/2022   Procedure: WOUND VAC PLACEMENT;  Surgeon: Erle Crocker, MD;  Location: [place];  Service: Orthopedics;  Laterality: Left;   BONE BIOPSY Left 04/24/2021   Procedure: BONE BIOPSY;  Surgeon: Landis Martins, DPM;  Location: [place];  Service: Podiatry;  Laterality: Left;  left ankle/fibula   BONE BIOPSY Left  07/15/2021   Procedure: LEFT FOOT BONE BIOPSY;  Surgeon: Trula Slade, DPM;  Location: [place];  Service: Podiatry;  Laterality: Left;   Cardiac Event Monitor  July-August 2015   Sinus rhythm with PVCs   CHOLECYSTECTOMY     COLONOSCOPY N/A 08/31/2013   Procedure: COLONOSCOPY;  Surgeon: Juanita Craver, MD;  Location: [facility] ENDOSCOPY;  Service: Endoscopy;  Laterality: N/A;   CORONARY STENT INTERVENTION N/A 11/19/2020   Procedure: CORONARY STENT INTERVENTION;  Surgeon: Nelva Bush, MD;  Location: [place] [facility];  Service: Cardiovascular;  Laterality: N/A;   ESOPHAGOGASTRODUODENOSCOPY N/A 09/01/2013   Procedure: ESOPHAGOGASTRODUODENOSCOPY (EGD);  Surgeon: Beryle Beams, MD;  Location: [facility] ENDOSCOPY;  Service: Endoscopy;  Laterality: N/A;  bedside   ESOPHAGOGASTRODUODENOSCOPY (EGD) WITH PROPOFOL N/A 05/26/2021   Procedure: ESOPHAGOGASTRODUODENOSCOPY (EGD) WITH PROPOFOL;  Surgeon: Carol Ada, MD;  Location: [place];  Service: Gastroenterology;  Laterality: N/A;   EYE SURGERY Bilateral    bilateral cataracts   INCISION AND DRAINAGE Left 04/24/2021   Procedure: INCISION AND DRAINAGE;  Surgeon: Landis Martins, DPM;  Location: [place];  Service: Podiatry;  Laterality: Left;   IRRIGATION AND DEBRIDEMENT FOOT Left 07/15/2021   Procedure: IRRIGATION AND DEBRIDEMENT FOOT;  Surgeon: Trula Slade, DPM;  Location: [place];  Service: Podiatry;  Laterality: Left;   LEFT HEART CATH AND CORONARY ANGIOGRAPHY N/A 11/19/2020   Procedure: LEFT HEART CATH AND CORONARY ANGIOGRAPHY;  Surgeon: Nelva Bush, MD;  Location: [place] [facility];  Service: Cardiovascular;  Laterality: N/A;   LEFT HEART CATHETERIZATION WITH CORONARY ANGIOGRAM N/A 08/30/2013   Procedure: LEFT HEART CATHETERIZATION WITH CORONARY ANGIOGRAM;  Surgeon: Leonie Man, MD;  Location: [facility] CATH LAB: 100% mRCA (thrombus - extends to RPAV), 80% D1, 40% AVG Cx.   PERCUTANEOUS CORONARY STENT INTERVENTION (PCI-S)  08/30/2013    Procedure: PERCUTANEOUS CORONARY STENT INTERVENTION (PCI-S);  Surgeon: Leonie Man, MD;  Location: [facility] CATH LAB;  Integrity Resolute DES 2.0 mm x 38 mm -- 3.35 mm.; PTCA of proximal RPA V. - 3.0 mm x 15 mm balloon   PERIPHERAL VASCULAR ATHERECTOMY  05/26/2019   Procedure: PERIPHERAL VASCULAR ATHERECTOMY;  Surgeon: Marty Heck, MD;  Location: [place] [facility];  Service: Cardiovascular;;  Left SFA   PERIPHERAL VASCULAR BALLOON ANGIOPLASTY Left 07/11/2021   Procedure: PERIPHERAL VASCULAR BALLOON ANGIOPLASTY;  Surgeon: Marty Heck, MD;  Location: [place] [facility];  Service: Cardiovascular;  Laterality: Left;  PT TRUNK / AT   PERIPHERAL VASCULAR INTERVENTION  05/26/2019   Procedure: PERIPHERAL VASCULAR INTERVENTION;  Surgeon: Marty Heck, MD;  Location: [place] [facility];  Service: Cardiovascular;;  Left SFA   PERIPHERAL VASCULAR INTERVENTION Right 11/15/2020   Procedure: PERIPHERAL VASCULAR INTERVENTION;  Surgeon: Marty Heck, MD;  Location: [place] [facility];  Service: Cardiovascular;  Laterality: Right;  Superficial Femoral Artery   PERIPHERAL VASCULAR INTERVENTION Left 07/11/2021  Procedure: PERIPHERAL VASCULAR INTERVENTION;  Surgeon: Marty Heck, MD;  Location: [place] [facility];  Service: Cardiovascular;  Laterality: Left;  SFA   PITUITARY SURGERY     TEE WITHOUT CARDIOVERSION N/A 04/26/2021   Procedure: TRANSESOPHAGEAL ECHOCARDIOGRAM (TEE);  Surgeon: Buford Dresser, MD;  Location: [facility] ENDOSCOPY;  Service: Cardiovascular;  Laterality: N/A;   TRANSTHORACIC ECHOCARDIOGRAM  08/30/2013   mild LVH. EF 50-55%. Moderate HK of the entire inferior myocardium. GR 1 DD. Mild LA dilation. Mildly reduced RV function   TYMPANOMASTOIDECTOMY Right 12/28/2017   Procedure: RIGHT TYMPANOMASTOIDECTOMY;  Surgeon: Leta Baptist, MD;  Location: [place];  Service: ENT;  Laterality: Right;   Patient Active Problem List   Diagnosis Date Noted    Nocturia more than twice per night 02/03/2022   Unilateral complete BKA, left, initial encounter ([place]) 01/17/2022   Osteomyelitis ([place]) 01/13/2022   Normocytic anemia 01/13/2022   Atrial fibrillation ([place]) 08/15/2021   Septic arthritis of foot ([place]) 08/02/2021   Medication monitoring encounter 08/02/2021   Acute on chronic systolic CHF (congestive heart failure) ([place]) 06/02/2021   Hypothyroidism 06/02/2021   Ulcer of esophagus without bleeding    Pressure injury of skin 05/31/2021   Hypokalemia 05/30/2021   Symptomatic anemia 05/29/2021   AKI (acute kidney injury) ([place]) 05/29/2021   GI bleed 05/29/2021   Liver lesion 05/29/2021   Vertigo 05/24/2021   Generalized weakness 05/24/2021   Tenosynovitis of left ankle 04/25/2021   Thrombocytosis 04/23/2021   Fatty liver 06/19/2020   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 ([place]) [id number]   Atherosclerotic heart disease of native coronary artery without angina pectoris 03/20/2020   Epigastric pain 03/20/2020   Nausea 03/20/2020   Absence of toe ([place]) 06/28/2019   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   PAD (peripheral artery disease) ([place]) 05/26/2019   Osteomyelitis of left foot ([place]) 05/24/2019   Cellulitis and abscess of foot, except toes 05/18/2019   Chronic osteomyelitis of ankle and foot ([place]) 05/18/2019   Cholesteatoma 02/24/2019   Cellulitis and abscess of toe 11/09/2018   Colon cancer screening 11/30/2017   Long term (current) use of insulin ([place]) 03/05/2017   Iron deficiency anemia 12/10/2014   Obesity (BMI 30-39.9) 11/17/2013   Diabetes mellitus, type 2 ([place])    Essential hypertension    Right thigh pain 11/01/2013   PVC's (premature ventricular contractions) 11/01/2013   Status post insertion of drug eluting coronary artery stent to [facility] emergently and PTCA to prox. PLA 09/16/2013   Hyperlipidemia associated with type 2 diabetes mellitus ([place]) 09/16/2013   Presence of coronary angioplasty  implant and graft 09/08/2013   Panhypopituitarism ([place]) 08/30/2013   Non-ST elevation (NSTEMI) myocardial infarction ([place]) 08/22/2013   CAD S/P percutaneous coronary angioplasty 08/22/2013    REFERRING DIAG: [id number] (ICD-10-CM) - Hx of BKA, left    ONSET DATE:  03/19/2022 prosthesis delivery   THERAPY DIAG:  Other abnormalities of gait and mobility  Unsteadiness on feet  Pain in left lower leg  Muscle weakness (generalized)  Dizziness and giddiness  Other symptoms and signs involving the musculoskeletal system  Rationale for Evaluation and Treatment Rehabilitation  PERTINENT HISTORY: CAD, STEMI, CHF, PAD, A-Fib, IDDM2, severe septic arthritis   PRECAUTIONS: Fall  SUBJECTIVE:  SUBJECTIVE STATEMENT:   She has been doing the new exercises.  She tends to have some issues if she waits until the afternoon.      PAIN:  Are you having pain? Yes: NPRS scale:  today 0/10 and in last week 1/10-8/10 Pain location: left distal tibia, posterior knee & distal limb.  Pain description: sore Aggravating factors: walking & standing.  Relieving factors: taking prosthesis off   Back pain lumbar: 0/10 today              If walks >50-75' her back flares up.    OBJECTIVE: (objective measures completed at initial evaluation unless otherwise dated)  COGNITION: Overall cognitive status: Within functional limits for tasks assessed   POSTURE: rounded shoulders, forward head, flexed trunk , and weight shift right   LOWER EXTREMITY ROM:   ROM P:passive  A:active Right eval Left eval  Hip flexion   WFL  Hip extension   [facility]  Hip abduction   WFL  Hip adduction      Hip internal rotation      Hip external rotation      Knee flexion   90*  Knee extension   -5*  Ankle dorsiflexion   NA  Ankle plantarflexion   NA   Ankle inversion   NA  Ankle eversion   NA   (Blank rows = not tested)   LOWER EXTREMITY MMT:   MMT Right eval Left eval  Hip flexion   4/5  Hip extension   3+/5  Hip abduction   3+/5  Hip adduction      Hip internal rotation      Hip external rotation      Knee flexion   3+/5  Knee extension   4-/5  Ankle dorsiflexion   NA  Ankle plantarflexion   NA  Ankle inversion   NA  Ankle eversion   NA  (Blank rows = not tested)   TRANSFERS: Sit to stand: SBA requires armrests & BUEs to push up but able to stabilize without touching RW Stand to sit: SBA requires armrests & BUEs to control descent.    GAIT: Gait pattern: step to pattern, decreased step length- Right, decreased stance time- Left, decreased hip/knee flexion- Left, circumduction- Left, Left hip hike, knee flexed in stance- Left, antalgic, lateral hip instability, trunk flexed, and abducted- Left Distance walked: 35' Assistive device utilized: Environmental consultant - 2 wheeled and TTA prosthesis Level of assistance: SBA  verbal cues only with RW Comments: excessive UE weight bearing on RW   FUNCTIONAL TESTs:  04/28/2022:  PT assessed vertigo:  Brayton Caves  - left cervical rotation sit to supine no nystagmus, right rotation no dizziness and rolling right. -right cervical rotation sit to supine no nystagmus, left rotation slight dizziness and rolling left slight dizziness. Nestor Lewandowsky To right light dizziness sit to supine & severe dizziness sitting up. To left light dizziness sit to supine & severe dizziness sitting up.  Orthostatic Hypotension Supine no dizziness  145/70 HR 80  Standing light dizziness 119/67 HR 108 After standing 3 min  no dizziness BP 158/76 HR 112 Positive for orthostatic Hypotension   04/21/2022: Dizziness with head motions.  Berg Balance Scale: 19/56   [facility] PT Assessment - 04/21/22 1505                Standardized Balance Assessment    Standardized Balance Assessment Berg Balance Test           [facility] Balance Test  Sit to Stand Able to stand  independently using hands     Standing Unsupported Able to stand 2 minutes with supervision     Sitting with Back Unsupported but Feet Supported on Floor or Stool Able to sit safely and securely 2 minutes     Stand to Sit Controls descent by using hands     Transfers Able to transfer safely, definite need of hands     Standing Unsupported with Eyes Closed Needs help to keep from falling     Standing Unsupported with Feet Together Needs help to attain position and unable to hold for 15 seconds     From Standing, Reach Forward with Outstretched Arm Reaches forward but needs supervision     From Standing Position, Pick up Object from Floor Unable to pick up and needs supervision     From Standing Position, Turn to Look Behind Over each Shoulder Needs supervision when turning     Turn 360 Degrees Needs assistance while turning     Standing Unsupported, Alternately Place Feet on Step/Stool Needs assistance to keep from falling or unable to try     Standing Unsupported, One Foot in [employer] balance while stepping or standing     Standing on One Leg Unable to try or needs assist to prevent fall     Total Score 19     Berg comment: BERG  < 36 high risk for falls (close to 100%) 46-51 moderate (>50%)   37-45 significant (>80%) 52-55 lower (> 25%)                     CURRENT PROSTHETIC WEAR ASSESSMENT:  Patient is independent with: prosthetic cleaning Patient is dependent with: skin check, residual limb care, correct ply sock adjustment, proper wear schedule/adjustment, and proper weight-bearing schedule/adjustment Donning prosthesis: SBA Doffing prosthesis: Modified independence Prosthetic wear tolerance: ~14 hours total between 2x/day, 7 days/week Prosthetic weight bearing tolerance: 5 minutes with partial weight on prosthesis with limb pain 8/10 Edema: pitting Residual limb condition: no open areas but one small area distal tibia that may  be internal suture working out,  shiny skin with no hair growth, redness over patella & distal tibia, no temperature issues noted. Cylindrical shape Prosthetic description: silicon liner with pin lock suspension, total contact socket with flexible inner socket, dynamic response foot/ankle       TODAY'S TREATMENT:                                                                                                                             DATE:  05/05/2022: Prosthetic Training with Transtibial Prosthesis: PT continues to recommend prosthesis wear all awake hours. PT educated on fall risk when prosthesis is off. Pt verbalized understanding.  PT demo & verbal cues on rollator safety including use of brakes, sit/stand from chair & seat of rollator and ramps/curbs.  Pt amb 100' X 3 with rollator including turning to sit / stand  on rollator.  minor correctional verbal cues. Worked on scanning maintaining path & pace. Pt neg ramp & curb with rollator with supervision. PT cued on technique.      04/30/2022: Prosthetic Training with Transtibial Prosthesis: PT reviewed recommendation for wear from arising to bedtime.  Washing liner around dinner time.  PT reviewed adjusting ply socks.  PT instructed in fall risk & stress to back/RLE when prosthesis is off. Pt verbalized understanding.  PT instructed in using 24" bar stool for ADLs & building trunk endurance. Pt verbalized understanding.   Neuromuscular Re-education: In corner with RW in front for safety:  -on floor eyes open hip width stance head motions right/left, up/down & diagonals 5 reps ea. -on floor eyes closed hip width stance head motions right/left, up/down & diagonals 5 reps ea. -on foam eyes open hip width stance head motions right/left, up/down & diagonals 5 reps ea. PT instructed in above as HEP with demo, verbal & HO cues. Pt verbalized understanding.    04/28/2022: Neuromuscular Re-education: PT assessed vertigo:  Brayton Caves  - left  cervical rotation sit to supine no nystagmus, right rotation no dizziness and rolling right. -right cervical rotation sit to supine no nystagmus, left rotation slight dizziness and rolling left slight dizziness. Nestor Lewandowsky To right light dizziness sit to supine & severe dizziness sitting up. To left light dizziness sit to supine & severe dizziness sitting up.  Orthostatic Hypotension Supine no dizziness  145/70 HR 80  Standing light dizziness 119/67 HR 108 After standing 3 min  no dizziness BP 158/76 HR 112 Positive for orthostatic Hypotension  PT educated pt on orthostatic hypotension including dehydration signs/symptoms.  See pt instructions.    Standing in corner on floor with feet hip width eyes open head motion right/left, up/down & diagonals.  Gait with RW with head turns right/straight/left for 2 steps ea direction.     HOME EXERCISE PROGRAM: Access Code: [email or web address] URL: [email or web address] Date: 04/30/2022 Prepared by: Jamey Reas  Exercises - wide stance head motions eyes open  - 1 x daily - 4 x weekly - 1 sets - 10 reps - 2 seconds hold - Feet Apart with Eyes Closed with Head Motions  - 1 x daily - 4 x weekly - 1 sets - 10 reps - 2 seconds hold - stand on foam eyes open   - 1 x daily - 4 x weekly - 1 sets - 10 reps - 2 seconds hold    Do each exercise 5-10 repetitions Hold each exercise for 2 seconds to feel your location  AT [place].  Try to find this position when standing still for activities.   USE TAPE ON FLOOR TO MARK THE MIDLINE POSITION which is even with middle of sink.  You also should try to feel with your limb pressure in socket.  You are trying to feel with limb what you used to feel with the bottom of your foot.  Side to Side Shift: Moving your hips only (not shoulders): move weight onto your left leg, HOLD/FEEL pressure in socket.  Move back to equal weight on  each leg, HOLD/FEEL pressure in socket. Move weight onto your right leg, HOLD/FEEL pressure in socket. Move back to equal weight on each leg, HOLD/FEEL pressure in socket. Repeat.  Start with both hands on sink, progress to hand on prosthetic side only, then no hands.  Front to Back Shift: Moving your hips only (not  shoulders): move your weight forward onto your toes, HOLD/FEEL pressure in socket. Move your weight back to equal Flat Foot on both legs, HOLD/FEEL  pressure in socket. Move your weight back onto your heels, HOLD/FEEL  pressure in socket. Move your weight back to equal on both legs, HOLD/FEEL  pressure in socket. Repeat.  Start with both hands on sink, progress to hand on prosthetic side only, then no hands.  Moving Cones / Cups: With equal weight on each leg: Hold on with one hand the first time, then progress to no hand supports. Move cups from one side of sink to the other. Place cups ~2" out of your reach, progress to 10" beyond reach.  Place one hand in middle of sink and reach with other hand. Do both arms.  Then hover one hand and move cups with other hand.  Overhead/Upward Reaching: alternated reaching up to top cabinets or ceiling if no cabinets present. Keep equal weight on each leg. Start with one hand support on counter while other hand reaches and progress to no hand support with reaching.  ace one hand in middle of sink and reach with other hand. Do both arms.  Then hover one hand and move cups with other hand.  5.   Looking Over Shoulders: With equal weight on each leg: alternate turning to look over your shoulders with one hand support on counter as needed.  Start with head motions only to look in front of shoulder, then even with shoulder and progress to looking behind you. To look to side, move head /eyes, then shoulder on side looking pulls back, shift more weight to side looking and pull hip back. Place one hand in middle of sink and let go with other hand so your shoulder can  pull back. Switch hands to look other way.   Then hover one hand and look over shoulder. If looking right, use left hand at sink. If looking left, use right hand at sink. 6.  Stepping with leg that is not amputated:  Move items under cabinet out of your way. Shift your hips/pelvis so weight on prosthesis. Tighten muscles in hip on prosthetic side.  SLOWLY step other leg so front of foot is in cabinet. Then step back to floor.     ASSESSMENT:  CLINICAL IMPRESSION: PT instructed patient in use of rollator walker including ramps and curbs as patient reports she has access to one.  She appears to have a basic understanding of safe utilization of rollator.  PT introduced prosthetic gait with cane but patient needs further work prior to trying outside of PT   OBJECTIVE IMPAIRMENTS: Abnormal gait, decreased activity tolerance, decreased balance, decreased endurance, decreased knowledge of condition, decreased knowledge of use of DME, decreased mobility, difficulty walking, decreased ROM, decreased strength, increased edema, impaired flexibility, postural dysfunction, prosthetic dependency , obesity, and pain.    ACTIVITY LIMITATIONS: carrying, lifting, bending, standing, stairs, transfers, and locomotion level   PARTICIPATION LIMITATIONS: meal prep, cleaning, laundry, driving, community activity, and church   PERSONAL FACTORS: Fitness, Time since onset of injury/illness/exacerbation, and 3+ comorbidities: see PMH  are also affecting patient's functional outcome.    REHAB POTENTIAL: Good   CLINICAL DECISION MAKING: Evolving/moderate complexity   EVALUATION COMPLEXITY: Moderate     GOALS: Goals reviewed with patient? Yes   SHORT TERM GOALS: Target date: 05/22/2022   Patient donnes prosthesis modified independent & verbalizes proper cleaning. Baseline: SEE OBJECTIVE DATA Goal status: ongoing 05/05/2022 2.  Patient tolerates prosthesis >12  hrs total /day without skin issues or limb pain <5/10 after  standing. Baseline: SEE OBJECTIVE DATA Goal status: ongoing 05/05/2022   3. Berg Balance >/= 23/56. Baseline: SEE OBJECTIVE DATA Goal status: ongoing 05/05/2022   4. Patient ambulates 55' with RW & prosthesis with supervision. Baseline: SEE OBJECTIVE DATA Goal status: ongoing 05/05/2022   5. Patient negotiates ramps & curbs with RW & prosthesis with supervision. Baseline: SEE OBJECTIVE DATA Goal status: ongoing 05/05/2022   LONG TERM GOALS: Target date: 07/17/2022   Patient demonstrates & verbalized understanding of prosthetic care to enable safe utilization of prosthesis. Baseline: SEE OBJECTIVE DATA Goal status: INITIAL   Patient tolerates prosthesis wear >90% of awake hours without skin or limb pain issues. Baseline: SEE OBJECTIVE DATA Goal status: INITIAL   Berg Balance >36/56 to indicate lower fall risk Baseline: SEE OBJECTIVE DATA Goal status: INITIAL   Patient ambulates >300' with prosthesis & LRADindependently Baseline: SEE OBJECTIVE DATA Goal status: INITIAL   Patient negotiates ramps, curbs with LRAD & stairs with single rail with prosthesis independently. Baseline: SEE OBJECTIVE DATA Goal status: INITIAL   Patient verbalizes & demonstrates understanding of HEP Baseline: SEE OBJECTIVE DATA Goal status: INITIAL   PLAN:   PT FREQUENCY: 2x/week   PT DURATION: 12 weeks   PLANNED INTERVENTIONS: Therapeutic exercises, Therapeutic activity, Neuromuscular re-education, Balance training, Gait training, Patient/Family education, Self Care, Stair training, Vestibular training, Canalith repositioning, Prosthetic training, DME instructions, and physical performance training   PLAN FOR NEXT SESSION: Check if patient got rollator walker and review use as needed, continue gait with cane near counter / rail.    Jamey Reas, PT, DPT 05/05/2022, 4:24 PM

## 2022-05-07 ENCOUNTER — Ambulatory Visit (INDEPENDENT_AMBULATORY_CARE_PROVIDER_SITE_OTHER): Payer: Medicare Other | Admitting: Physical Therapy

## 2022-05-07 ENCOUNTER — Encounter: Payer: Self-pay | Admitting: Physical Therapy

## 2022-05-07 DIAGNOSIS — M6281 Muscle weakness (generalized): Secondary | ICD-10-CM | POA: Diagnosis not present

## 2022-05-07 DIAGNOSIS — R42 Dizziness and giddiness: Secondary | ICD-10-CM

## 2022-05-07 DIAGNOSIS — M79662 Pain in left lower leg: Secondary | ICD-10-CM

## 2022-05-07 DIAGNOSIS — R2689 Other abnormalities of gait and mobility: Secondary | ICD-10-CM | POA: Diagnosis not present

## 2022-05-07 DIAGNOSIS — R29898 Other symptoms and signs involving the musculoskeletal system: Secondary | ICD-10-CM

## 2022-05-07 DIAGNOSIS — R2681 Unsteadiness on feet: Secondary | ICD-10-CM

## 2022-05-07 NOTE — Therapy (Signed)
OUTPATIENT PHYSICAL THERAPY TREATMENT NOTE   Patient Name: Tammy Good MRN: [id number] DOB:May 03, 1950, 72 y.o., female Today's Date: 05/07/2022  PCP: general provider REFERRING PROVIDER: Melony Overly, MD   END OF SESSION:   PT End of Session - 05/07/22 1636     Visit Number 6    Number of Visits 25    Date for PT Re-Evaluation 07/17/22    Authorization Type Medicare & BCBS state health PPO    Progress Note Due on Visit 10    PT Start Time 1555    PT Stop Time 1633    PT Time Calculation (min) 38 min    Equipment Utilized During Treatment Gait belt    Activity Tolerance Patient tolerated treatment well;Patient limited by pain    Behavior During Therapy WFL for tasks assessed/performed                Past Medical History:  Diagnosis Date   CAD S/P percutaneous coronary angioplasty 08/2013   100% mRCA - PCI Integrity Resolute DES 3.0 mm x 38 mm - 3.35 mm; PTCA of RPA V 2.0 mm x 15 mm   CHF (congestive heart failure) (HCC)    Cholesteatoma    right   Diabetes mellitus type 2 in obese (HCC)    On insulin and Invokana   History of osteomyelitis L 5th Toe all 05/2019   s/p Partial Ray Amputation with partial closure; 6 wks Abx & LSFA Atherectomy/DEB PTA with Stent for focal dissection.   Hyperlipidemia with target LDL less than 70    Hypothyroidism (acquired)    Mild essential hypertension    Obesity (BMI 30-39.9) 11/17/2013   PAD (peripheral artery disease) ([place]) 05/26/2019   05/26/19: Abd AoGram- BLE runoff -> L SFA orbital atherectomy - PTA w/ DEB & Stent 6 x 40 Luttonix (for focal dissection) - patent Pop A with 3 V runoff. LEA Dopplers 01/03/2020: RABI (prev) 0.68 (0.53)/ RTBI (prev) 0.58 (0.33); LABI (prev) 0.80 (0.64), LTBI (prev) 0.64 (0.51); R mSFA ~50-74%, L mSFA 50-74%. Patent Prox SFA stent < 49% stenosis   Panhypopituitarism (HCC)    ST elevation myocardial infarction (STEMI) of inferior wall, subsequent episode of care ([place]) 08/2013   80% branch of  D1, 40% mid AV groove circumflex, 100% RCA with subacute thrombus -- thrombus extending into RPA V with 100% occlusion after initial angioplasty of mid RCA ;; Post MI ECHO 6/9/'15: EF 50-55%, mild LVH with moderate HK of inferior wall, Gr1 DD, mild LA dilation; mildly reduced RV function   Past Surgical History:  Procedure Laterality Date   ABDOMINAL AORTOGRAM W/LOWER EXTREMITY N/A 05/26/2019   Procedure: ABDOMINAL AORTOGRAM W/LOWER EXTREMITY;  Surgeon: Marty Heck, MD;  Location: [place] [facility];  Service: Cardiovascular;  Laterality: N/A;   ABDOMINAL AORTOGRAM W/LOWER EXTREMITY N/A 11/15/2020   Procedure: ABDOMINAL AORTOGRAM W/LOWER EXTREMITY;  Surgeon: Marty Heck, MD;  Location: [place] [facility];  Service: Cardiovascular;  Laterality: N/A;   ABDOMINAL AORTOGRAM W/LOWER EXTREMITY N/A 04/18/2021   Procedure: ABDOMINAL AORTOGRAM W/LOWER EXTREMITY;  Surgeon: Marty Heck, MD;  Location: [place] [facility];  Service: Cardiovascular;  Laterality: N/A;   ABDOMINAL AORTOGRAM W/LOWER EXTREMITY Left 07/11/2021   Procedure: ABDOMINAL AORTOGRAM W/LOWER EXTREMITY;  Surgeon: Marty Heck, MD;  Location: [place] [facility];  Service: Cardiovascular;  Laterality: Left;   AMPUTATION Left 05/25/2019   Procedure: AMPUTATION RAY 5th;  Surgeon: Trula Slade, DPM;  Location: [place];  Service: Podiatry;  Laterality: Left;   AMPUTATION Left 01/14/2022   Procedure: LEFT BELOW THE KNEE AMPUTATION;  Surgeon: Erle Crocker, MD;  Location: [place];  Service: Orthopedics;  Laterality: Left;  LENGTH OF SURGERY: 90 MINUTES   APPLICATION OF WOUND VAC Left 01/14/2022   Procedure: WOUND VAC PLACEMENT;  Surgeon: Erle Crocker, MD;  Location: [place];  Service: Orthopedics;  Laterality: Left;   BONE BIOPSY Left 04/24/2021   Procedure: BONE BIOPSY;  Surgeon: Landis Martins, DPM;  Location: [place];  Service: Podiatry;  Laterality: Left;  left ankle/fibula   BONE BIOPSY Left  07/15/2021   Procedure: LEFT FOOT BONE BIOPSY;  Surgeon: Trula Slade, DPM;  Location: [place];  Service: Podiatry;  Laterality: Left;   Cardiac Event Monitor  July-August 2015   Sinus rhythm with PVCs   CHOLECYSTECTOMY     COLONOSCOPY N/A 08/31/2013   Procedure: COLONOSCOPY;  Surgeon: Juanita Craver, MD;  Location: [facility] ENDOSCOPY;  Service: Endoscopy;  Laterality: N/A;   CORONARY STENT INTERVENTION N/A 11/19/2020   Procedure: CORONARY STENT INTERVENTION;  Surgeon: Nelva Bush, MD;  Location: [place] [facility];  Service: Cardiovascular;  Laterality: N/A;   ESOPHAGOGASTRODUODENOSCOPY N/A 09/01/2013   Procedure: ESOPHAGOGASTRODUODENOSCOPY (EGD);  Surgeon: Beryle Beams, MD;  Location: [facility] ENDOSCOPY;  Service: Endoscopy;  Laterality: N/A;  bedside   ESOPHAGOGASTRODUODENOSCOPY (EGD) WITH PROPOFOL N/A 05/26/2021   Procedure: ESOPHAGOGASTRODUODENOSCOPY (EGD) WITH PROPOFOL;  Surgeon: Carol Ada, MD;  Location: [place];  Service: Gastroenterology;  Laterality: N/A;   EYE SURGERY Bilateral    bilateral cataracts   INCISION AND DRAINAGE Left 04/24/2021   Procedure: INCISION AND DRAINAGE;  Surgeon: Landis Martins, DPM;  Location: [place];  Service: Podiatry;  Laterality: Left;   IRRIGATION AND DEBRIDEMENT FOOT Left 07/15/2021   Procedure: IRRIGATION AND DEBRIDEMENT FOOT;  Surgeon: Trula Slade, DPM;  Location: [place];  Service: Podiatry;  Laterality: Left;   LEFT HEART CATH AND CORONARY ANGIOGRAPHY N/A 11/19/2020   Procedure: LEFT HEART CATH AND CORONARY ANGIOGRAPHY;  Surgeon: Nelva Bush, MD;  Location: [place] [facility];  Service: Cardiovascular;  Laterality: N/A;   LEFT HEART CATHETERIZATION WITH CORONARY ANGIOGRAM N/A 08/30/2013   Procedure: LEFT HEART CATHETERIZATION WITH CORONARY ANGIOGRAM;  Surgeon: Leonie Man, MD;  Location: [facility] CATH LAB: 100% mRCA (thrombus - extends to RPAV), 80% D1, 40% AVG Cx.   PERCUTANEOUS CORONARY STENT INTERVENTION (PCI-S)  08/30/2013    Procedure: PERCUTANEOUS CORONARY STENT INTERVENTION (PCI-S);  Surgeon: Leonie Man, MD;  Location: [facility] CATH LAB;  Integrity Resolute DES 2.0 mm x 38 mm -- 3.35 mm.; PTCA of proximal RPA V. - 3.0 mm x 15 mm balloon   PERIPHERAL VASCULAR ATHERECTOMY  05/26/2019   Procedure: PERIPHERAL VASCULAR ATHERECTOMY;  Surgeon: Marty Heck, MD;  Location: [place] [facility];  Service: Cardiovascular;;  Left SFA   PERIPHERAL VASCULAR BALLOON ANGIOPLASTY Left 07/11/2021   Procedure: PERIPHERAL VASCULAR BALLOON ANGIOPLASTY;  Surgeon: Marty Heck, MD;  Location: [place] [facility];  Service: Cardiovascular;  Laterality: Left;  PT TRUNK / AT   PERIPHERAL VASCULAR INTERVENTION  05/26/2019   Procedure: PERIPHERAL VASCULAR INTERVENTION;  Surgeon: Marty Heck, MD;  Location: [place] [facility];  Service: Cardiovascular;;  Left SFA   PERIPHERAL VASCULAR INTERVENTION Right 11/15/2020   Procedure: PERIPHERAL VASCULAR INTERVENTION;  Surgeon: Marty Heck, MD;  Location: [place] [facility];  Service: Cardiovascular;  Laterality: Right;  Superficial Femoral Artery   PERIPHERAL VASCULAR INTERVENTION Left 07/11/2021  Procedure: PERIPHERAL VASCULAR INTERVENTION;  Surgeon: Marty Heck, MD;  Location: [place] [facility];  Service: Cardiovascular;  Laterality: Left;  SFA   PITUITARY SURGERY     TEE WITHOUT CARDIOVERSION N/A 04/26/2021   Procedure: TRANSESOPHAGEAL ECHOCARDIOGRAM (TEE);  Surgeon: Buford Dresser, MD;  Location: [facility] ENDOSCOPY;  Service: Cardiovascular;  Laterality: N/A;   TRANSTHORACIC ECHOCARDIOGRAM  08/30/2013   mild LVH. EF 50-55%. Moderate HK of the entire inferior myocardium. GR 1 DD. Mild LA dilation. Mildly reduced RV function   TYMPANOMASTOIDECTOMY Right 12/28/2017   Procedure: RIGHT TYMPANOMASTOIDECTOMY;  Surgeon: Leta Baptist, MD;  Location: [place];  Service: ENT;  Laterality: Right;   Patient Active Problem List   Diagnosis Date Noted    Nocturia more than twice per night 02/03/2022   Unilateral complete BKA, left, initial encounter () 01/17/2022   Osteomyelitis ([place]) 01/13/2022   Normocytic anemia 01/13/2022   Atrial fibrillation ([place]) 08/15/2021   Septic arthritis of foot ([place]) 08/02/2021   Medication monitoring encounter 08/02/2021   Acute on chronic systolic CHF (congestive heart failure) ([place]) 06/02/2021   Hypothyroidism 06/02/2021   Ulcer of esophagus without bleeding    Pressure injury of skin 05/31/2021   Hypokalemia 05/30/2021   Symptomatic anemia 05/29/2021   AKI (acute kidney injury) ([place]) 05/29/2021   GI bleed 05/29/2021   Liver lesion 05/29/2021   Vertigo 05/24/2021   Generalized weakness 05/24/2021   Tenosynovitis of left ankle 04/25/2021   Thrombocytosis 04/23/2021   Fatty liver 06/19/2020   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 ([place]) [id number]   Atherosclerotic heart disease of native coronary artery without angina pectoris 03/20/2020   Epigastric pain 03/20/2020   Nausea 03/20/2020   Absence of toe ([place]) 06/28/2019   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   PAD (peripheral artery disease) ([place]) 05/26/2019   Osteomyelitis of left foot ([place]) 05/24/2019   Cellulitis and abscess of foot, except toes 05/18/2019   Chronic osteomyelitis of ankle and foot ([place]) 05/18/2019   Cholesteatoma 02/24/2019   Cellulitis and abscess of toe 11/09/2018   Colon cancer screening 11/30/2017   Long term (current) use of insulin ([place]) 03/05/2017   Iron deficiency anemia 12/10/2014   Obesity (BMI 30-39.9) 11/17/2013   Diabetes mellitus, type 2 ([place])    Essential hypertension    Right thigh pain 11/01/2013   PVC's (premature ventricular contractions) 11/01/2013   Status post insertion of drug eluting coronary artery stent to [facility] emergently and PTCA to prox. PLA 09/16/2013   Hyperlipidemia associated with type 2 diabetes mellitus ([place]) 09/16/2013   Presence of coronary angioplasty  implant and graft 09/08/2013   Panhypopituitarism ([place]) 08/30/2013   Non-ST elevation (NSTEMI) myocardial infarction ([place]) 08/22/2013   CAD S/P percutaneous coronary angioplasty 08/22/2013    REFERRING DIAG: [id number] (ICD-10-CM) - Hx of BKA, left    ONSET DATE:  03/19/2022 prosthesis delivery   THERAPY DIAG:  Other abnormalities of gait and mobility  Unsteadiness on feet  Pain in left lower leg  Muscle weakness (generalized)  Dizziness and giddiness  Other symptoms and signs involving the musculoskeletal system  Rationale for Evaluation and Treatment Rehabilitation  PERTINENT HISTORY: CAD, STEMI, CHF, PAD, A-Fib, IDDM2, severe septic arthritis   PRECAUTIONS: Fall  SUBJECTIVE:  SUBJECTIVE STATEMENT:   She relays some fatigue today and upset stomach.  PAIN:  Are you having pain? Yes: NPRS scale:  today 0/10 and in last week 1/10-8/10 Pain location: left distal tibia, posterior knee & distal limb.  Pain description: sore Aggravating factors: walking & standing.  Relieving factors: taking prosthesis off   Back pain lumbar: 0/10 today              If walks >50-75' her back flares up.    OBJECTIVE: (objective measures completed at initial evaluation unless otherwise dated)  COGNITION: Overall cognitive status: Within functional limits for tasks assessed   POSTURE: rounded shoulders, forward head, flexed trunk , and weight shift right   LOWER EXTREMITY ROM:   ROM P:passive  A:active Right eval Left eval  Hip flexion   WFL  Hip extension   [facility]  Hip abduction   WFL  Hip adduction      Hip internal rotation      Hip external rotation      Knee flexion   90*  Knee extension   -5*  Ankle dorsiflexion   NA  Ankle plantarflexion   NA  Ankle inversion   NA  Ankle eversion   NA   (Blank rows  = not tested)   LOWER EXTREMITY MMT:   MMT Right eval Left eval  Hip flexion   4/5  Hip extension   3+/5  Hip abduction   3+/5  Hip adduction      Hip internal rotation      Hip external rotation      Knee flexion   3+/5  Knee extension   4-/5  Ankle dorsiflexion   NA  Ankle plantarflexion   NA  Ankle inversion   NA  Ankle eversion   NA  (Blank rows = not tested)   TRANSFERS: Sit to stand: SBA requires armrests & BUEs to push up but able to stabilize without touching RW Stand to sit: SBA requires armrests & BUEs to control descent.    GAIT: Gait pattern: step to pattern, decreased step length- Right, decreased stance time- Left, decreased hip/knee flexion- Left, circumduction- Left, Left hip hike, knee flexed in stance- Left, antalgic, lateral hip instability, trunk flexed, and abducted- Left Distance walked: 30' Assistive device utilized: Environmental consultant - 2 wheeled and TTA prosthesis Level of assistance: SBA  verbal cues only with RW Comments: excessive UE weight bearing on RW   FUNCTIONAL TESTs:  04/28/2022:  PT assessed vertigo:  [place] Caves  - left cervical rotation sit to supine no nystagmus, right rotation no dizziness and rolling right. -right cervical rotation sit to supine no nystagmus, left rotation slight dizziness and rolling left slight dizziness. Nestor Lewandowsky To right light dizziness sit to supine & severe dizziness sitting up. To left light dizziness sit to supine & severe dizziness sitting up.  Orthostatic Hypotension Supine no dizziness  145/70 HR 80  Standing light dizziness 119/67 HR 108 After standing 3 min  no dizziness BP 158/76 HR 112 Positive for orthostatic Hypotension   04/21/2022: Dizziness with head motions.  Berg Balance Scale: 19/56   [facility] PT Assessment - 04/21/22 1505                Standardized Balance Assessment    Standardized Balance Assessment Berg Balance Test          Berg Balance Test    Sit to Stand Able to stand  independently  using hands     Standing  Unsupported Able to stand 2 minutes with supervision     Sitting with Back Unsupported but Feet Supported on Floor or Stool Able to sit safely and securely 2 minutes     Stand to Sit Controls descent by using hands     Transfers Able to transfer safely, definite need of hands     Standing Unsupported with Eyes Closed Needs help to keep from falling     Standing Unsupported with Feet Together Needs help to attain position and unable to hold for 15 seconds     From Standing, Reach Forward with Outstretched Arm Reaches forward but needs supervision     From Standing Position, Pick up Object from Floor Unable to pick up and needs supervision     From Standing Position, Turn to Look Behind Over each Shoulder Needs supervision when turning     Turn 360 Degrees Needs assistance while turning     Standing Unsupported, Alternately Place Feet on Step/Stool Needs assistance to keep from falling or unable to try     Standing Unsupported, One Foot in [employer] balance while stepping or standing     Standing on One Leg Unable to try or needs assist to prevent fall     Total Score 19     Berg comment: BERG  < 36 high risk for falls (close to 100%) 46-51 moderate (>50%)   37-45 significant (>80%) 52-55 lower (> 25%)                     CURRENT PROSTHETIC WEAR ASSESSMENT:  Patient is independent with: prosthetic cleaning Patient is dependent with: skin check, residual limb care, correct ply sock adjustment, proper wear schedule/adjustment, and proper weight-bearing schedule/adjustment Donning prosthesis: SBA Doffing prosthesis: Modified independence Prosthetic wear tolerance: ~14 hours total between 2x/day, 7 days/week Prosthetic weight bearing tolerance: 5 minutes with partial weight on prosthesis with limb pain 8/10 Edema: pitting Residual limb condition: no open areas but one small area distal tibia that may be internal suture working out,  shiny skin with no hair growth,  redness over patella & distal tibia, no temperature issues noted. Cylindrical shape Prosthetic description: silicon liner with pin lock suspension, total contact socket with flexible inner socket, dynamic response foot/ankle       TODAY'S TREATMENT:                                                                                                                             DATE:  05/07/2022: Prosthetic Training with Transtibial Prosthesis: Gait with SPC in Rt UE, 100 feet X 2 with CGA.  Ambulated 3 stairs in clinic hall up/down with UE support on handrail on Rt going down and on left going up as it would be at her home. She had SPC in other hand and needed cues and demo for proper technique. Able to perform with CGA-min A Gait with rollator 100 feet X 2 working on 180 deg  turns and sitting/standing from rollator.  Neuromuscular Re-education: In corner with RW in front for safety:  -on floor eyes open hip width stance head motions right/left, up/down & diagonals 10 reps ea. -on floor eyes closed hip width stance head motions right/left, up/down & diagonals 10 reps ea. -on floor with hip width stance eyes closed 10 sec X 5     HOME EXERCISE PROGRAM: Access Code: [email or web address] URL: [email or web address] Date: 04/30/2022 Prepared by: Jamey Reas  Exercises - wide stance head motions eyes open  - 1 x daily - 4 x weekly - 1 sets - 10 reps - 2 seconds hold - Feet Apart with Eyes Closed with Head Motions  - 1 x daily - 4 x weekly - 1 sets - 10 reps - 2 seconds hold - stand on foam eyes open   - 1 x daily - 4 x weekly - 1 sets - 10 reps - 2 seconds hold    Do each exercise 5-10 repetitions Hold each exercise for 2 seconds to feel your location  AT [place].  Try to find this position when standing still for activities.   USE TAPE ON FLOOR TO MARK THE MIDLINE POSITION which is even with middle of sink.  You  also should try to feel with your limb pressure in socket.  You are trying to feel with limb what you used to feel with the bottom of your foot.  Side to Side Shift: Moving your hips only (not shoulders): move weight onto your left leg, HOLD/FEEL pressure in socket.  Move back to equal weight on each leg, HOLD/FEEL pressure in socket. Move weight onto your right leg, HOLD/FEEL pressure in socket. Move back to equal weight on each leg, HOLD/FEEL pressure in socket. Repeat.  Start with both hands on sink, progress to hand on prosthetic side only, then no hands.  Front to Back Shift: Moving your hips only (not shoulders): move your weight forward onto your toes, HOLD/FEEL pressure in socket. Move your weight back to equal Flat Foot on both legs, HOLD/FEEL  pressure in socket. Move your weight back onto your heels, HOLD/FEEL  pressure in socket. Move your weight back to equal on both legs, HOLD/FEEL  pressure in socket. Repeat.  Start with both hands on sink, progress to hand on prosthetic side only, then no hands.  Moving Cones / Cups: With equal weight on each leg: Hold on with one hand the first time, then progress to no hand supports. Move cups from one side of sink to the other. Place cups ~2" out of your reach, progress to 10" beyond reach.  Place one hand in middle of sink and reach with other hand. Do both arms.  Then hover one hand and move cups with other hand.  Overhead/Upward Reaching: alternated reaching up to top cabinets or ceiling if no cabinets present. Keep equal weight on each leg. Start with one hand support on counter while other hand reaches and progress to no hand support with reaching.  ace one hand in middle of sink and reach with other hand. Do both arms.  Then hover one hand and move cups with other hand.  5.   Looking Over Shoulders: With equal weight on each leg: alternate turning to look over your shoulders with one hand support on counter as needed.  Start with head motions only to  look in front of shoulder, then even with shoulder and progress  to looking behind you. To look to side, move head /eyes, then shoulder on side looking pulls back, shift more weight to side looking and pull hip back. Place one hand in middle of sink and let go with other hand so your shoulder can pull back. Switch hands to look other way.   Then hover one hand and look over shoulder. If looking right, use left hand at sink. If looking left, use right hand at sink. 6.  Stepping with leg that is not amputated:  Move items under cabinet out of your way. Shift your hips/pelvis so weight on prosthesis. Tighten muscles in hip on prosthetic side.  SLOWLY step other leg so front of foot is in cabinet. Then step back to floor.     ASSESSMENT:  CLINICAL IMPRESSION: She has not yet acquired rollator, the person she was going to get one from has recounted that. She wants to order one online and I printed out a good option for her. We continued to work on gait with rollator and cane today. We showed her how to perform stairs with one handrail and cane like she would have at home. PT recommending to continue with current plan.    OBJECTIVE IMPAIRMENTS: Abnormal gait, decreased activity tolerance, decreased balance, decreased endurance, decreased knowledge of condition, decreased knowledge of use of DME, decreased mobility, difficulty walking, decreased ROM, decreased strength, increased edema, impaired flexibility, postural dysfunction, prosthetic dependency , obesity, and pain.    ACTIVITY LIMITATIONS: carrying, lifting, bending, standing, stairs, transfers, and locomotion level   PARTICIPATION LIMITATIONS: meal prep, cleaning, laundry, driving, community activity, and church   PERSONAL FACTORS: Fitness, Time since onset of injury/illness/exacerbation, and 3+ comorbidities: see PMH  are also affecting patient's functional outcome.    REHAB POTENTIAL: Good   CLINICAL DECISION MAKING: Evolving/moderate  complexity   EVALUATION COMPLEXITY: Moderate     GOALS: Goals reviewed with patient? Yes   SHORT TERM GOALS: Target date: 05/22/2022   Patient donnes prosthesis modified independent & verbalizes proper cleaning. Baseline: SEE OBJECTIVE DATA Goal status: ongoing 05/05/2022 2.  Patient tolerates prosthesis >12 hrs total /day without skin issues or limb pain <5/10 after standing. Baseline: SEE OBJECTIVE DATA Goal status: ongoing 05/05/2022   3. Berg Balance >/= 23/56. Baseline: SEE OBJECTIVE DATA Goal status: ongoing 05/05/2022   4. Patient ambulates 96' with RW & prosthesis with supervision. Baseline: SEE OBJECTIVE DATA Goal status: ongoing 05/05/2022   5. Patient negotiates ramps & curbs with RW & prosthesis with supervision. Baseline: SEE OBJECTIVE DATA Goal status: ongoing 05/05/2022   LONG TERM GOALS: Target date: 07/17/2022   Patient demonstrates & verbalized understanding of prosthetic care to enable safe utilization of prosthesis. Baseline: SEE OBJECTIVE DATA Goal status: INITIAL   Patient tolerates prosthesis wear >90% of awake hours without skin or limb pain issues. Baseline: SEE OBJECTIVE DATA Goal status: INITIAL   Berg Balance >36/56 to indicate lower fall risk Baseline: SEE OBJECTIVE DATA Goal status: INITIAL   Patient ambulates >300' with prosthesis & LRADindependently Baseline: SEE OBJECTIVE DATA Goal status: INITIAL   Patient negotiates ramps, curbs with LRAD & stairs with single rail with prosthesis independently. Baseline: SEE OBJECTIVE DATA Goal status: INITIAL   Patient verbalizes & demonstrates understanding of HEP Baseline: SEE OBJECTIVE DATA Goal status: INITIAL   PLAN:   PT FREQUENCY: 2x/week   PT DURATION: 12 weeks   PLANNED INTERVENTIONS: Therapeutic exercises, Therapeutic activity, Neuromuscular re-education, Balance training, Gait training, Patient/Family education, Self Care, Stair training, Vestibular training, Canalith  repositioning,  Prosthetic training, DME instructions, and physical performance training   PLAN FOR NEXT SESSION: Check if patient got rollator walker and review use as needed, continue gait with cane near counter / rail.    Debbe Odea, PT, DPT 05/07/2022, 4:38 PM

## 2022-05-12 ENCOUNTER — Ambulatory Visit (INDEPENDENT_AMBULATORY_CARE_PROVIDER_SITE_OTHER): Payer: Medicare Other | Admitting: Physical Therapy

## 2022-05-12 ENCOUNTER — Encounter: Payer: Self-pay | Admitting: Physical Therapy

## 2022-05-12 DIAGNOSIS — R2681 Unsteadiness on feet: Secondary | ICD-10-CM | POA: Diagnosis not present

## 2022-05-12 DIAGNOSIS — M79662 Pain in left lower leg: Secondary | ICD-10-CM

## 2022-05-12 DIAGNOSIS — R2689 Other abnormalities of gait and mobility: Secondary | ICD-10-CM

## 2022-05-12 DIAGNOSIS — R42 Dizziness and giddiness: Secondary | ICD-10-CM

## 2022-05-12 DIAGNOSIS — R29898 Other symptoms and signs involving the musculoskeletal system: Secondary | ICD-10-CM

## 2022-05-12 DIAGNOSIS — M6281 Muscle weakness (generalized): Secondary | ICD-10-CM

## 2022-05-12 NOTE — Patient Instructions (Signed)
Stand at sink with chair back on your right & left and w/c or locked rollator behind you.  Place back of pelvis against counter with feet under your pelvis.  Rest your hands on counter beside you.  Time how long you can stand upright.  Try to increase time over days & weeks.  Stand facing w/c lean forward placing both hands in seat & stand back up straight, then lean forward hovering hands over seat Facing sink: Rotate upper body to place item on back of each chair on right & left. Side bend placing water bottle in each seat. Reach over head following hands with eyes.

## 2022-05-12 NOTE — Therapy (Signed)
OUTPATIENT PHYSICAL THERAPY TREATMENT NOTE   Patient Name: Tammy Good MRN: [id number] DOB:1951-01-25, 72 y.o., female Today's Date: 05/12/2022  PCP: general provider REFERRING PROVIDER: Melony Overly, MD   END OF SESSION:   PT End of Session - 05/12/22 1514     Visit Number 7    Number of Visits 25    Date for PT Re-Evaluation 07/17/22    Authorization Type Medicare & BCBS state health PPO    Progress Note Due on Visit 10    PT Start Time 1515    PT Stop Time 1559    PT Time Calculation (min) 44 min    Equipment Utilized During Treatment Gait belt    Activity Tolerance Patient tolerated treatment well;Patient limited by pain    Behavior During Therapy WFL for tasks assessed/performed                Past Medical History:  Diagnosis Date   CAD S/P percutaneous coronary angioplasty 08/2013   100% mRCA - PCI Integrity Resolute DES 3.0 mm x 38 mm - 3.35 mm; PTCA of RPA V 2.0 mm x 15 mm   CHF (congestive heart failure) (HCC)    Cholesteatoma    right   Diabetes mellitus type 2 in obese (HCC)    On insulin and Invokana   History of osteomyelitis L 5th Toe all 05/2019   s/p Partial Ray Amputation with partial closure; 6 wks Abx & LSFA Atherectomy/DEB PTA with Stent for focal dissection.   Hyperlipidemia with target LDL less than 70    Hypothyroidism (acquired)    Mild essential hypertension    Obesity (BMI 30-39.9) 11/17/2013   PAD (peripheral artery disease) ([place]) 05/26/2019   05/26/19: Abd AoGram- BLE runoff -> L SFA orbital atherectomy - PTA w/ DEB & Stent 6 x 40 Luttonix (for focal dissection) - patent Pop A with 3 V runoff. LEA Dopplers 01/03/2020: RABI (prev) 0.68 (0.53)/ RTBI (prev) 0.58 (0.33); LABI (prev) 0.80 (0.64), LTBI (prev) 0.64 (0.51); R mSFA ~50-74%, L mSFA 50-74%. Patent Prox SFA stent < 49% stenosis   Panhypopituitarism (HCC)    ST elevation myocardial infarction (STEMI) of inferior wall, subsequent episode of care ([place]) 08/2013   80% branch of  D1, 40% mid AV groove circumflex, 100% RCA with subacute thrombus -- thrombus extending into RPA V with 100% occlusion after initial angioplasty of mid RCA ;; Post MI ECHO 6/9/'15: EF 50-55%, mild LVH with moderate HK of inferior wall, Gr1 DD, mild LA dilation; mildly reduced RV function   Past Surgical History:  Procedure Laterality Date   ABDOMINAL AORTOGRAM W/LOWER EXTREMITY N/A 05/26/2019   Procedure: ABDOMINAL AORTOGRAM W/LOWER EXTREMITY;  Surgeon: Marty Heck, MD;  Location: [place] [facility];  Service: Cardiovascular;  Laterality: N/A;   ABDOMINAL AORTOGRAM W/LOWER EXTREMITY N/A 11/15/2020   Procedure: ABDOMINAL AORTOGRAM W/LOWER EXTREMITY;  Surgeon: Marty Heck, MD;  Location: [place] [facility];  Service: Cardiovascular;  Laterality: N/A;   ABDOMINAL AORTOGRAM W/LOWER EXTREMITY N/A 04/18/2021   Procedure: ABDOMINAL AORTOGRAM W/LOWER EXTREMITY;  Surgeon: Marty Heck, MD;  Location: [place] [facility];  Service: Cardiovascular;  Laterality: N/A;   ABDOMINAL AORTOGRAM W/LOWER EXTREMITY Left 07/11/2021   Procedure: ABDOMINAL AORTOGRAM W/LOWER EXTREMITY;  Surgeon: Marty Heck, MD;  Location:  CV LAB;  Service: Cardiovascular;  Laterality: Left;   AMPUTATION Left 05/25/2019   Procedure: AMPUTATION RAY 5th;  Surgeon: Trula Slade, DPM;  Location: [place];  Service: Podiatry;  Laterality: Left;   AMPUTATION Left 01/14/2022   Procedure: LEFT BELOW THE KNEE AMPUTATION;  Surgeon: Erle Crocker, MD;  Location: [place];  Service: Orthopedics;  Laterality: Left;  LENGTH OF SURGERY: 90 MINUTES   APPLICATION OF WOUND VAC Left 01/14/2022   Procedure: WOUND VAC PLACEMENT;  Surgeon: Erle Crocker, MD;  Location: [place];  Service: Orthopedics;  Laterality: Left;   BONE BIOPSY Left 04/24/2021   Procedure: BONE BIOPSY;  Surgeon: Landis Martins, DPM;  Location: [place];  Service: Podiatry;  Laterality: Left;  left ankle/fibula   BONE BIOPSY Left  07/15/2021   Procedure: LEFT FOOT BONE BIOPSY;  Surgeon: Trula Slade, DPM;  Location: [place];  Service: Podiatry;  Laterality: Left;   Cardiac Event Monitor  July-August 2015   Sinus rhythm with PVCs   CHOLECYSTECTOMY     COLONOSCOPY N/A 08/31/2013   Procedure: COLONOSCOPY;  Surgeon: Juanita Craver, MD;  Location: [facility] ENDOSCOPY;  Service: Endoscopy;  Laterality: N/A;   CORONARY STENT INTERVENTION N/A 11/19/2020   Procedure: CORONARY STENT INTERVENTION;  Surgeon: Nelva Bush, MD;  Location: [place] [facility];  Service: Cardiovascular;  Laterality: N/A;   ESOPHAGOGASTRODUODENOSCOPY N/A 09/01/2013   Procedure: ESOPHAGOGASTRODUODENOSCOPY (EGD);  Surgeon: Beryle Beams, MD;  Location: [facility] ENDOSCOPY;  Service: Endoscopy;  Laterality: N/A;  bedside   ESOPHAGOGASTRODUODENOSCOPY (EGD) WITH PROPOFOL N/A 05/26/2021   Procedure: ESOPHAGOGASTRODUODENOSCOPY (EGD) WITH PROPOFOL;  Surgeon: Carol Ada, MD;  Location: [place];  Service: Gastroenterology;  Laterality: N/A;   EYE SURGERY Bilateral    bilateral cataracts   INCISION AND DRAINAGE Left 04/24/2021   Procedure: INCISION AND DRAINAGE;  Surgeon: Landis Martins, DPM;  Location: [place];  Service: Podiatry;  Laterality: Left;   IRRIGATION AND DEBRIDEMENT FOOT Left 07/15/2021   Procedure: IRRIGATION AND DEBRIDEMENT FOOT;  Surgeon: Trula Slade, DPM;  Location: [place];  Service: Podiatry;  Laterality: Left;   LEFT HEART CATH AND CORONARY ANGIOGRAPHY N/A 11/19/2020   Procedure: LEFT HEART CATH AND CORONARY ANGIOGRAPHY;  Surgeon: Nelva Bush, MD;  Location: [place] [facility];  Service: Cardiovascular;  Laterality: N/A;   LEFT HEART CATHETERIZATION WITH CORONARY ANGIOGRAM N/A 08/30/2013   Procedure: LEFT HEART CATHETERIZATION WITH CORONARY ANGIOGRAM;  Surgeon: Leonie Man, MD;  [facility] CATH LAB: 100% mRCA (thrombus - extends to RPAV), 80% D1, 40% AVG Cx.   PERCUTANEOUS CORONARY STENT INTERVENTION (PCI-S)  08/30/2013    Procedure: PERCUTANEOUS CORONARY STENT INTERVENTION (PCI-S);  Surgeon: Leonie Man, MD;  Location: [facility] CATH LAB;  Integrity Resolute DES 2.0 mm x 38 mm -- 3.35 mm.; PTCA of proximal RPA V. - 3.0 mm x 15 mm balloon   PERIPHERAL VASCULAR ATHERECTOMY  05/26/2019   Procedure: PERIPHERAL VASCULAR ATHERECTOMY;  Surgeon: Marty Heck, MD;  Location: [place] [facility];  Service: Cardiovascular;;  Left SFA   PERIPHERAL VASCULAR BALLOON ANGIOPLASTY Left 07/11/2021   Procedure: PERIPHERAL VASCULAR BALLOON ANGIOPLASTY;  Surgeon: Marty Heck, MD;  Location: [place] [facility];  Service: Cardiovascular;  Laterality: Left;  PT TRUNK / AT   PERIPHERAL VASCULAR INTERVENTION  05/26/2019   Procedure: PERIPHERAL VASCULAR INTERVENTION;  Surgeon: Marty Heck, MD;  Location: [place] [facility];  Service: Cardiovascular;;  Left SFA   PERIPHERAL VASCULAR INTERVENTION Right 11/15/2020   Procedure: PERIPHERAL VASCULAR INTERVENTION;  Surgeon: Marty Heck, MD;  Location: [place] [facility];  Service: Cardiovascular;  Laterality: Right;  Superficial Femoral Artery   PERIPHERAL VASCULAR INTERVENTION Left 07/11/2021  Procedure: PERIPHERAL VASCULAR INTERVENTION;  Surgeon: Marty Heck, MD;  Location: [place] [facility];  Service: Cardiovascular;  Laterality: Left;  SFA   PITUITARY SURGERY     TEE WITHOUT CARDIOVERSION N/A 04/26/2021   Procedure: TRANSESOPHAGEAL ECHOCARDIOGRAM (TEE);  Surgeon: Buford Dresser, MD;  Location: [facility] ENDOSCOPY;  Service: Cardiovascular;  Laterality: N/A;   TRANSTHORACIC ECHOCARDIOGRAM  08/30/2013   mild LVH. EF 50-55%. Moderate HK of the entire inferior myocardium. GR 1 DD. Mild LA dilation. Mildly reduced RV function   TYMPANOMASTOIDECTOMY Right 12/28/2017   Procedure: RIGHT TYMPANOMASTOIDECTOMY;  Surgeon: Leta Baptist, MD;  Location: [place];  Service: ENT;  Laterality: Right;   Patient Active Problem List   Diagnosis Date Noted    Nocturia more than twice per night 02/03/2022   Unilateral complete BKA, left, initial encounter ([place]) 01/17/2022   Osteomyelitis ([employer]) 01/13/2022   Normocytic anemia 01/13/2022   Atrial fibrillation ([place]) 08/15/2021   Septic arthritis of foot ([place]) 08/02/2021   Medication monitoring encounter 08/02/2021   Acute on chronic systolic CHF (congestive heart failure) ([place]) 06/02/2021   Hypothyroidism 06/02/2021   Ulcer of esophagus without bleeding    Pressure injury of skin 05/31/2021   Hypokalemia 05/30/2021   Symptomatic anemia 05/29/2021   AKI (acute kidney injury) ([place]) 05/29/2021   GI bleed 05/29/2021   Liver lesion 05/29/2021   Vertigo 05/24/2021   Generalized weakness 05/24/2021   Tenosynovitis of left ankle 04/25/2021   Thrombocytosis 04/23/2021   Fatty liver 06/19/2020   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   PAD (peripheral artery disease) ([place]) 05/26/2019   Osteomyelitis of left foot ([place]) 05/24/2019   Cellulitis and abscess of foot, except toes 05/18/2019   Chronic osteomyelitis of ankle and foot ([place]) 05/18/2019   Cholesteatoma 02/24/2019   Cellulitis and abscess of toe 11/09/2018   Colon cancer screening 11/30/2017   Long term (current) use of insulin ([place]) 03/05/2017   Iron deficiency anemia 12/10/2014   Obesity (BMI 30-39.9) 11/17/2013   Diabetes mellitus, type 2 ([place])    Essential hypertension    Right thigh pain 11/01/2013   PVC's (premature ventricular contractions) 11/01/2013   Status post insertion of drug eluting coronary artery stent to [facility] emergently and PTCA to prox. PLA 09/16/2013   Hyperlipidemia associated with type 2 diabetes mellitus ([place]) 09/16/2013   Presence of coronary angioplasty  implant and graft 09/08/2013   Panhypopituitarism ([place]) 08/30/2013   Non-ST elevation (NSTEMI) myocardial infarction ([place]) 08/22/2013   CAD S/P percutaneous coronary angioplasty 08/22/2013    REFERRING DIAG: [id number] (ICD-10-CM) - Hx of BKA, left    ONSET DATE:  03/19/2022 prosthesis delivery   THERAPY DIAG:  Other abnormalities of gait and mobility  Unsteadiness on feet  Pain in left lower leg  Muscle weakness (generalized)  Dizziness and giddiness  Other symptoms and signs involving the musculoskeletal system  Rationale for Evaluation and Treatment Rehabilitation  PERTINENT HISTORY: CAD, STEMI, CHF, PAD, A-Fib, IDDM2, severe septic arthritis   PRECAUTIONS: Fall  SUBJECTIVE:  SUBJECTIVE STATEMENT:   She found a rollator walker on line & wants to know if it will work for her.  Wearing prosthesis all awake hours. No falls.     PAIN:  Are you having pain? Yes: NPRS scale:  today 0/10 and in last week 0/10 Pain location: left distal tibia, posterior knee & distal limb.  Pain description: sore Aggravating factors: walking & standing.  Relieving factors: taking prosthesis off   Back pain lumbar: 0/10 today & in last week 7-8/10 walking or standing            OBJECTIVE: (objective measures completed at initial evaluation unless otherwise dated)  COGNITION: Overall cognitive status: Within functional limits for tasks assessed   POSTURE: rounded shoulders, forward head, flexed trunk , and weight shift right   LOWER EXTREMITY ROM:   ROM P:passive  A:active Right eval Left eval  Hip flexion   WFL  Hip extension   [facility]  Hip abduction   WFL  Hip adduction      Hip internal rotation      Hip external rotation      Knee flexion   90*  Knee extension   -5*  Ankle dorsiflexion   NA  Ankle  plantarflexion   NA  Ankle inversion   NA  Ankle eversion   NA   (Blank rows = not tested)   LOWER EXTREMITY MMT:   MMT Right eval Left eval  Hip flexion   4/5  Hip extension   3+/5  Hip abduction   3+/5  Hip adduction      Hip internal rotation      Hip external rotation      Knee flexion   3+/5  Knee extension   4-/5  Ankle dorsiflexion   NA  Ankle plantarflexion   NA  Ankle inversion   NA  Ankle eversion   NA  (Blank rows = not tested)   TRANSFERS: Sit to stand: SBA requires armrests & BUEs to push up but able to stabilize without touching RW Stand to sit: SBA requires armrests & BUEs to control descent.    GAIT: Gait pattern: step to pattern, decreased step length- Right, decreased stance time- Left, decreased hip/knee flexion- Left, circumduction- Left, Left hip hike, knee flexed in stance- Left, antalgic, lateral hip instability, trunk flexed, and abducted- Left Distance walked: 4' Assistive device utilized: Environmental consultant - 2 wheeled and TTA prosthesis Level of assistance: SBA  verbal cues only with RW Comments: excessive UE weight bearing on RW   FUNCTIONAL TESTs:  04/28/2022:  PT assessed vertigo:  Brayton Caves  - left cervical rotation sit to supine no nystagmus, right rotation no dizziness and rolling right. -right cervical rotation sit to supine no nystagmus, left rotation slight dizziness and rolling left slight dizziness. Nestor Lewandowsky To right light dizziness sit to supine & severe dizziness sitting up. To left light dizziness sit to supine & severe dizziness sitting up.  Orthostatic Hypotension Supine no dizziness  145/70 HR 80  Standing light dizziness 119/67 HR 108 After standing 3 min  no dizziness BP 158/76 HR 112 Positive for orthostatic Hypotension   04/21/2022: Dizziness with head motions.  Berg Balance Scale: 19/56   [facility] PT Assessment - 04/21/22 1505                Standardized Balance Assessment    Standardized Balance Assessment Berg Balance  Test          [facility] Balance Test  Sit to Stand Able to stand  independently using hands     Standing Unsupported Able to stand 2 minutes with supervision     Sitting with Back Unsupported but Feet Supported on Floor or Stool Able to sit safely and securely 2 minutes     Stand to Sit Controls descent by using hands     Transfers Able to transfer safely, definite need of hands     Standing Unsupported with Eyes Closed Needs help to keep from falling     Standing Unsupported with Feet Together Needs help to attain position and unable to hold for 15 seconds     From Standing, Reach Forward with Outstretched Arm Reaches forward but needs supervision     From Standing Position, Pick up Object from Floor Unable to pick up and needs supervision     From Standing Position, Turn to Look Behind Over each Shoulder Needs supervision when turning     Turn 360 Degrees Needs assistance while turning     Standing Unsupported, Alternately Place Feet on Step/Stool Needs assistance to keep from falling or unable to try     Standing Unsupported, One Foot in [employer] balance while stepping or standing     Standing on One Leg Unable to try or needs assist to prevent fall     Total Score 19     Berg comment: BERG  < 36 high risk for falls (close to 100%) 46-51 moderate (>50%)   37-45 significant (>80%) 52-55 lower (> 25%)                     CURRENT PROSTHETIC WEAR ASSESSMENT:  Patient is independent with: prosthetic cleaning Patient is dependent with: skin check, residual limb care, correct ply sock adjustment, proper wear schedule/adjustment, and proper weight-bearing schedule/adjustment Donning prosthesis: SBA Doffing prosthesis: Modified independence Prosthetic wear tolerance: ~14 hours total between 2x/day, 7 days/week Prosthetic weight bearing tolerance: 5 minutes with partial weight on prosthesis with limb pain 8/10 Edema: pitting Residual limb condition: no open areas but one small area distal  tibia that may be internal suture working out,  shiny skin with no hair growth, redness over patella & distal tibia, no temperature issues noted. Cylindrical shape Prosthetic description: silicon liner with pin lock suspension, total contact socket with flexible inner socket, dynamic response foot/ankle       TODAY'S TREATMENT:                                                                                                                             DATE:  05/12/2022: Prosthetic Training with Transtibial Prosthesis: Continue wear all awake hours. PT looked at printout of rollator walker that she wants to purchase and it looks like it would work. Pt verbalized understanding.  Neuromuscular Re-education:  Standing at sink with chair back on each side with chair behind:  Standing with back to sink for upright posture getting upper body over feet /  pelvis.  Reaching both hands over head with slight back ext. 5 reps  Trunk rotation right & left moving item with both hands from counter to each chair back 5 reps  Side bend placing water bottle in seat 5 reps to ea side.  Controlled flexion to upright placing hands in chair seat 5 reps PT educated on above as HEP to improve back strength & endurance. Increasing reps or number of each exercise before rest seated. Today she needed rest before ea exercise.    Therapeutic exercise: Nustep seat 9 level 5 with BLEs & BUEs 8 min PT educated on using seated recumbent machine that uses arms & legs at [facility] or local fitness center.  Starting 5 min work, 5 min rest, 3 sets.  As able change to 6 min work, 4 min rest ...  Pt verbalized understanding including recommendation to call [employer] to see if her plan covers membership.     05/07/2022: Prosthetic Training with Transtibial Prosthesis: Gait with SPC in Rt UE, 100 feet X 2 with CGA.  Ambulated 3 stairs in clinic hall up/down with UE support on handrail on Rt going down and on left going up as it would be at  her home. She had SPC in other hand and needed cues and demo for proper technique. Able to perform with CGA-min A Gait with rollator 100 feet X 2 working on 180 deg turns and sitting/standing from rollator.  Neuromuscular Re-education: In corner with RW in front for safety:  -on floor eyes open hip width stance head motions right/left, up/down & diagonals 10 reps ea. -on floor eyes closed hip width stance head motions right/left, up/down & diagonals 10 reps ea. -on floor with hip width stance eyes closed 10 sec X 5  05/05/2022: Prosthetic Training with Transtibial Prosthesis: PT continues to recommend prosthesis wear all awake hours. PT educated on fall risk when prosthesis is off. Pt verbalized understanding.  PT demo & verbal cues on rollator safety including use of brakes, sit/stand from chair & seat of rollator and ramps/curbs.  Pt amb 100' X 3 with rollator including turning to sit / stand on rollator.  minor correctional verbal cues. Worked on scanning maintaining path & pace. Pt neg ramp & curb with rollator with supervision. PT cued on technique.      HOME EXERCISE PROGRAM: Access Code: [email or web address] URL: [email or web address] Date: 04/30/2022 Prepared by: Jamey Reas  Exercises - wide stance head motions eyes open  - 1 x daily - 4 x weekly - 1 sets - 10 reps - 2 seconds hold - Feet Apart with Eyes Closed with Head Motions  - 1 x daily - 4 x weekly - 1 sets - 10 reps - 2 seconds hold - stand on foam eyes open   - 1 x daily - 4 x weekly - 1 sets - 10 reps - 2 seconds hold    Do each exercise 5-10 repetitions Hold each exercise for 2 seconds to feel your location  AT [place].  Try to find this position when standing still for activities.   USE TAPE ON FLOOR TO MARK THE MIDLINE POSITION which is even with middle of sink.  You also should try to feel with your limb pressure in socket.  You are  trying to feel with limb what you used to feel with the bottom of your foot.  Side to Side Shift: Moving your hips only (not shoulders):  move weight onto your left leg, HOLD/FEEL pressure in socket.  Move back to equal weight on each leg, HOLD/FEEL pressure in socket. Move weight onto your right leg, HOLD/FEEL pressure in socket. Move back to equal weight on each leg, HOLD/FEEL pressure in socket. Repeat.  Start with both hands on sink, progress to hand on prosthetic side only, then no hands.  Front to Back Shift: Moving your hips only (not shoulders): move your weight forward onto your toes, HOLD/FEEL pressure in socket. Move your weight back to equal Flat Foot on both legs, HOLD/FEEL  pressure in socket. Move your weight back onto your heels, HOLD/FEEL  pressure in socket. Move your weight back to equal on both legs, HOLD/FEEL  pressure in socket. Repeat.  Start with both hands on sink, progress to hand on prosthetic side only, then no hands.  Moving Cones / Cups: With equal weight on each leg: Hold on with one hand the first time, then progress to no hand supports. Move cups from one side of sink to the other. Place cups ~2" out of your reach, progress to 10" beyond reach.  Place one hand in middle of sink and reach with other hand. Do both arms.  Then hover one hand and move cups with other hand.  Overhead/Upward Reaching: alternated reaching up to top cabinets or ceiling if no cabinets present. Keep equal weight on each leg. Start with one hand support on counter while other hand reaches and progress to no hand support with reaching.  ace one hand in middle of sink and reach with other hand. Do both arms.  Then hover one hand and move cups with other hand.  5.   Looking Over Shoulders: With equal weight on each leg: alternate turning to look over your shoulders with one hand support on counter as needed.  Start with head motions only to look in front of shoulder, then even with shoulder and progress to  looking behind you. To look to side, move head /eyes, then shoulder on side looking pulls back, shift more weight to side looking and pull hip back. Place one hand in middle of sink and let go with other hand so your shoulder can pull back. Switch hands to look other way.   Then hover one hand and look over shoulder. If looking right, use left hand at sink. If looking left, use right hand at sink. 6.  Stepping with leg that is not amputated:  Move items under cabinet out of your way. Shift your hips/pelvis so weight on prosthesis. Tighten muscles in hip on prosthetic side.  SLOWLY step other leg so front of foot is in cabinet. Then step back to floor.   Back & standing endurance HEP on 05/12/22 Stand at sink with chair back on your right & left and w/c or locked rollator behind you.  Place back of pelvis against counter with feet under your pelvis.  Rest your hands on counter beside you.  Time how long you can stand upright.  Try to increase time over days & weeks.  Stand facing w/c lean forward placing both hands in seat & stand back up straight, then lean forward hovering hands over seat Facing sink: Rotate upper body to place item on back of each chair on right & left. Side bend placing water bottle in each seat. Reach over head following hands with eyes.     ASSESSMENT:  CLINICAL IMPRESSION: Pt appears to understand HEP for trunk exercises & endurance.  PT  instructed as back seems to be biggest limiting factor for standing & gait.  Pt continues to benefit from skilled PT.    OBJECTIVE IMPAIRMENTS: Abnormal gait, decreased activity tolerance, decreased balance, decreased endurance, decreased knowledge of condition, decreased knowledge of use of DME, decreased mobility, difficulty walking, decreased ROM, decreased strength, increased edema, impaired flexibility, postural dysfunction, prosthetic dependency , obesity, and pain.    ACTIVITY LIMITATIONS: carrying, lifting, bending, standing, stairs,  transfers, and locomotion level   PARTICIPATION LIMITATIONS: meal prep, cleaning, laundry, driving, community activity, and church   PERSONAL FACTORS: Fitness, Time since onset of injury/illness/exacerbation, and 3+ comorbidities: see PMH  are also affecting patient's functional outcome.    REHAB POTENTIAL: Good   CLINICAL DECISION MAKING: Evolving/moderate complexity   EVALUATION COMPLEXITY: Moderate     GOALS: Goals reviewed with patient? Yes   SHORT TERM GOALS: Target date: 05/22/2022   Patient donnes prosthesis modified independent & verbalizes proper cleaning. Baseline: SEE OBJECTIVE DATA Goal status: ongoing 05/05/2022 2.  Patient tolerates prosthesis >12 hrs total /day without skin issues or limb pain <5/10 after standing. Baseline: SEE OBJECTIVE DATA Goal status: ongoing 05/05/2022   3. Berg Balance >/= 23/56. Baseline: SEE OBJECTIVE DATA Goal status: ongoing 05/05/2022   4. Patient ambulates 70' with RW & prosthesis with supervision. Baseline: SEE OBJECTIVE DATA Goal status: ongoing 05/05/2022   5. Patient negotiates ramps & curbs with RW & prosthesis with supervision. Baseline: SEE OBJECTIVE DATA Goal status: ongoing 05/05/2022   LONG TERM GOALS: Target date: 07/17/2022   Patient demonstrates & verbalized understanding of prosthetic care to enable safe utilization of prosthesis. Baseline: SEE OBJECTIVE DATA Goal status: INITIAL   Patient tolerates prosthesis wear >90% of awake hours without skin or limb pain issues. Baseline: SEE OBJECTIVE DATA Goal status: INITIAL   Berg Balance >36/56 to indicate lower fall risk Baseline: SEE OBJECTIVE DATA Goal status: INITIAL   Patient ambulates >300' with prosthesis & LRADindependently Baseline: SEE OBJECTIVE DATA Goal status: INITIAL   Patient negotiates ramps, curbs with LRAD & stairs with single rail with prosthesis independently. Baseline: SEE OBJECTIVE DATA Goal status: INITIAL   Patient verbalizes &  demonstrates understanding of HEP Baseline: SEE OBJECTIVE DATA Goal status: INITIAL   PLAN:   PT FREQUENCY: 2x/week   PT DURATION: 12 weeks   PLANNED INTERVENTIONS: Therapeutic exercises, Therapeutic activity, Neuromuscular re-education, Balance training, Gait training, Patient/Family education, Self Care, Stair training, Vestibular training, Canalith repositioning, Prosthetic training, DME instructions, and physical performance training   PLAN FOR NEXT SESSION: continue gait with cane near counter / rail.     Jamey Reas, PT, DPT 05/12/2022, 4:09 PM

## 2022-05-14 ENCOUNTER — Encounter: Payer: Self-pay | Admitting: Physical Therapy

## 2022-05-14 ENCOUNTER — Ambulatory Visit (INDEPENDENT_AMBULATORY_CARE_PROVIDER_SITE_OTHER): Payer: Medicare Other | Admitting: Physical Therapy

## 2022-05-14 DIAGNOSIS — M6281 Muscle weakness (generalized): Secondary | ICD-10-CM

## 2022-05-14 DIAGNOSIS — M79662 Pain in left lower leg: Secondary | ICD-10-CM

## 2022-05-14 DIAGNOSIS — R2681 Unsteadiness on feet: Secondary | ICD-10-CM | POA: Diagnosis not present

## 2022-05-14 DIAGNOSIS — R2689 Other abnormalities of gait and mobility: Secondary | ICD-10-CM

## 2022-05-14 NOTE — Therapy (Signed)
OUTPATIENT PHYSICAL THERAPY TREATMENT NOTE   Patient Name: Tammy Good MRN: [id number] DOB:1951-01-23, 72 y.o., female Today's Date: 05/14/2022  PCP: general provider REFERRING PROVIDER: Melony Overly, MD   END OF SESSION:   PT End of Session - 05/14/22 1518     Visit Number 8    Number of Visits 25    Date for PT Re-Evaluation 07/17/22    Authorization Type Medicare & BCBS state health PPO    Progress Note Due on Visit 10    PT Start Time 1515    PT Stop Time 1558    PT Time Calculation (min) 43 min    Equipment Utilized During Treatment Gait belt    Activity Tolerance Patient tolerated treatment well;Patient limited by pain    Behavior During Therapy WFL for tasks assessed/performed                 Past Medical History:  Diagnosis Date   CAD S/P percutaneous coronary angioplasty 08/2013   100% mRCA - PCI Integrity Resolute DES 3.0 mm x 38 mm - 3.35 mm; PTCA of RPA V 2.0 mm x 15 mm   CHF (congestive heart failure) ([place])    Cholesteatoma    right   Diabetes mellitus type 2 in obese (HCC)    On insulin and Invokana   History of osteomyelitis L 5th Toe all 05/2019   s/p Partial Ray Amputation with partial closure; 6 wks Abx & LSFA Atherectomy/DEB PTA with Stent for focal dissection.   Hyperlipidemia with target LDL less than 70    Hypothyroidism (acquired)    Mild essential hypertension    Obesity (BMI 30-39.9) 11/17/2013   PAD (peripheral artery disease) ([place]) 05/26/2019   05/26/19: Abd AoGram- BLE runoff -> L SFA orbital atherectomy - PTA w/ DEB & Stent 6 x 40 Luttonix (for focal dissection) - patent Pop A with 3 V runoff. LEA Dopplers 01/03/2020: RABI (prev) 0.68 (0.53)/ RTBI (prev) 0.58 (0.33); LABI (prev) 0.80 (0.64), LTBI (prev) 0.64 (0.51); R mSFA ~50-74%, L mSFA 50-74%. Patent Prox SFA stent < 49% stenosis   Panhypopituitarism (HCC)    ST elevation myocardial infarction (STEMI) of inferior wall, subsequent episode of care ([place]) 08/2013   80% branch  of D1, 40% mid AV groove circumflex, 100% RCA with subacute thrombus -- thrombus extending into RPA V with 100% occlusion after initial angioplasty of mid RCA ;; Post MI ECHO 6/9/'15: EF 50-55%, mild LVH with moderate HK of inferior wall, Gr1 DD, mild LA dilation; mildly reduced RV function   Past Surgical History:  Procedure Laterality Date   ABDOMINAL AORTOGRAM W/LOWER EXTREMITY N/A 05/26/2019   Procedure: ABDOMINAL AORTOGRAM W/LOWER EXTREMITY;  Surgeon: Marty Heck, MD;  Location: [place] [facility];  Service: Cardiovascular;  Laterality: N/A;   ABDOMINAL AORTOGRAM W/LOWER EXTREMITY N/A 11/15/2020   Procedure: ABDOMINAL AORTOGRAM W/LOWER EXTREMITY;  Surgeon: Marty Heck, MD;  Location: [place] [facility];  Service: Cardiovascular;  Laterality: N/A;   ABDOMINAL AORTOGRAM W/LOWER EXTREMITY N/A 04/18/2021   Procedure: ABDOMINAL AORTOGRAM W/LOWER EXTREMITY;  Surgeon: Marty Heck, MD;  Location: [place] [facility];  Service: Cardiovascular;  Laterality: N/A;   ABDOMINAL AORTOGRAM W/LOWER EXTREMITY Left 07/11/2021   Procedure: ABDOMINAL AORTOGRAM W/LOWER EXTREMITY;  Surgeon: Marty Heck, MD;  Location: [place] [facility];  Service: Cardiovascular;  Laterality: Left;   AMPUTATION Left 05/25/2019   Procedure: AMPUTATION RAY 5th;  Surgeon: Trula Slade, DPM;  Location: [place];  Service: Podiatry;  Laterality: Left;   AMPUTATION Left 01/14/2022   Procedure: LEFT BELOW THE KNEE AMPUTATION;  Surgeon: Erle Crocker, MD;  Location: [place];  Service: Orthopedics;  Laterality: Left;  LENGTH OF SURGERY: 90 MINUTES   APPLICATION OF WOUND VAC Left 01/14/2022   Procedure: WOUND VAC PLACEMENT;  Surgeon: Erle Crocker, MD;  Location: [place];  Service: Orthopedics;  Laterality: Left;   BONE BIOPSY Left 04/24/2021   Procedure: BONE BIOPSY;  Surgeon: Landis Martins, DPM;  Location: [place];  Service: Podiatry;  Laterality: Left;  left ankle/fibula   BONE BIOPSY Left  07/15/2021   Procedure: LEFT FOOT BONE BIOPSY;  Surgeon: Trula Slade, DPM;  Location: [place];  Service: Podiatry;  Laterality: Left;   Cardiac Event Monitor  July-August 2015   Sinus rhythm with PVCs   CHOLECYSTECTOMY     COLONOSCOPY N/A 08/31/2013   Procedure: COLONOSCOPY;  Surgeon: Juanita Craver, MD;  Location: [facility] ENDOSCOPY;  Service: Endoscopy;  Laterality: N/A;   CORONARY STENT INTERVENTION N/A 11/19/2020   Procedure: CORONARY STENT INTERVENTION;  Surgeon: Nelva Bush, MD;  Location: [place] [facility];  Service: Cardiovascular;  Laterality: N/A;   ESOPHAGOGASTRODUODENOSCOPY N/A 09/01/2013   Procedure: ESOPHAGOGASTRODUODENOSCOPY (EGD);  Surgeon: Beryle Beams, MD;  Location: [facility] ENDOSCOPY;  Service: Endoscopy;  Laterality: N/A;  bedside   ESOPHAGOGASTRODUODENOSCOPY (EGD) WITH PROPOFOL N/A 05/26/2021   Procedure: ESOPHAGOGASTRODUODENOSCOPY (EGD) WITH PROPOFOL;  Surgeon: Carol Ada, MD;  Location: [place];  Service: Gastroenterology;  Laterality: N/A;   EYE SURGERY Bilateral    bilateral cataracts   INCISION AND DRAINAGE Left 04/24/2021   Procedure: INCISION AND DRAINAGE;  Surgeon: Landis Martins, DPM;  Location: [place];  Service: Podiatry;  Laterality: Left;   IRRIGATION AND DEBRIDEMENT FOOT Left 07/15/2021   Procedure: IRRIGATION AND DEBRIDEMENT FOOT;  Surgeon: Trula Slade, DPM;  Location: [place];  Service: Podiatry;  Laterality: Left;   LEFT HEART CATH AND CORONARY ANGIOGRAPHY N/A 11/19/2020   Procedure: LEFT HEART CATH AND CORONARY ANGIOGRAPHY;  Surgeon: Nelva Bush, MD;  Location: [place] [facility];  Service: Cardiovascular;  Laterality: N/A;   LEFT HEART CATHETERIZATION WITH CORONARY ANGIOGRAM N/A 08/30/2013   Procedure: LEFT HEART CATHETERIZATION WITH CORONARY ANGIOGRAM;  Surgeon: Leonie Man, MD;  Location: [facility] CATH LAB: 100% mRCA (thrombus - extends to RPAV), 80% D1, 40% AVG Cx.   PERCUTANEOUS CORONARY STENT INTERVENTION (PCI-S)  08/30/2013    Procedure: PERCUTANEOUS CORONARY STENT INTERVENTION (PCI-S);  Surgeon: Leonie Man, MD;  Location: [facility] CATH LAB;  Integrity Resolute DES 2.0 mm x 38 mm -- 3.35 mm.; PTCA of proximal RPA V. - 3.0 mm x 15 mm balloon   PERIPHERAL VASCULAR ATHERECTOMY  05/26/2019   Procedure: PERIPHERAL VASCULAR ATHERECTOMY;  Surgeon: Marty Heck, MD;  Location: [place] [facility];  Service: Cardiovascular;;  Left SFA   PERIPHERAL VASCULAR BALLOON ANGIOPLASTY Left 07/11/2021   Procedure: PERIPHERAL VASCULAR BALLOON ANGIOPLASTY;  Surgeon: Marty Heck, MD;  Location: [place] [facility];  Service: Cardiovascular;  Laterality: Left;  PT TRUNK / AT   PERIPHERAL VASCULAR INTERVENTION  05/26/2019   Procedure: PERIPHERAL VASCULAR INTERVENTION;  Surgeon: Marty Heck, MD;  Location: [place] [facility];  Service: Cardiovascular;;  Left SFA   PERIPHERAL VASCULAR INTERVENTION Right 11/15/2020   Procedure: PERIPHERAL VASCULAR INTERVENTION;  Surgeon: Marty Heck, MD;  Location: [place] [facility];  Service: Cardiovascular;  Laterality: Right;  Superficial Femoral Artery   PERIPHERAL VASCULAR INTERVENTION Left 07/11/2021  Procedure: PERIPHERAL VASCULAR INTERVENTION;  Surgeon: Marty Heck, MD;  Location: [place] [facility];  Service: Cardiovascular;  Laterality: Left;  SFA   PITUITARY SURGERY     TEE WITHOUT CARDIOVERSION N/A 04/26/2021   Procedure: TRANSESOPHAGEAL ECHOCARDIOGRAM (TEE);  Surgeon: Buford Dresser, MD;  Location: [facility] ENDOSCOPY;  Service: Cardiovascular;  Laterality: N/A;   TRANSTHORACIC ECHOCARDIOGRAM  08/30/2013   mild LVH. EF 50-55%. Moderate HK of the entire inferior myocardium. GR 1 DD. Mild LA dilation. Mildly reduced RV function   TYMPANOMASTOIDECTOMY Right 12/28/2017   Procedure: RIGHT TYMPANOMASTOIDECTOMY;  Surgeon: Leta Baptist, MD;  Location: [place];  Service: ENT;  Laterality: Right;   Patient Active Problem List   Diagnosis Date Noted    Nocturia more than twice per night 02/03/2022   Unilateral complete BKA, left, initial encounter ([place]) 01/17/2022   Osteomyelitis ([place]) 01/13/2022   Normocytic anemia 01/13/2022   Atrial fibrillation ([place]) 08/15/2021   Septic arthritis of foot ([place]) 08/02/2021   Medication monitoring encounter 08/02/2021   Acute on chronic systolic CHF (congestive heart failure) ([place]) 06/02/2021   Hypothyroidism 06/02/2021   Ulcer of esophagus without bleeding    Pressure injury of skin 05/31/2021   Hypokalemia 05/30/2021   Symptomatic anemia 05/29/2021   AKI (acute kidney injury) ([place]) 05/29/2021   GI bleed 05/29/2021   Liver lesion 05/29/2021   Vertigo 05/24/2021   Generalized weakness 05/24/2021   Tenosynovitis of left ankle 04/25/2021   Thrombocytosis 04/23/2021   Fatty liver 06/19/2020   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 ([place]) [id number]   Atherosclerotic heart disease of native coronary artery without angina pectoris 03/20/2020   Epigastric pain 03/20/2020   Nausea 03/20/2020   Absence of toe ([place]) 06/28/2019   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   PAD (peripheral artery disease) ([place]) 05/26/2019   Osteomyelitis of left foot ([place]) 05/24/2019   Cellulitis and abscess of foot, except toes 05/18/2019   Chronic osteomyelitis of ankle and foot ([place]) 05/18/2019   Cholesteatoma 02/24/2019   Cellulitis and abscess of toe 11/09/2018   Colon cancer screening 11/30/2017   Long term (current) use of insulin ([place]) 03/05/2017   Iron deficiency anemia 12/10/2014   Obesity (BMI 30-39.9) 11/17/2013   Diabetes mellitus, type 2 ([place])    Essential hypertension    Right thigh pain 11/01/2013   PVC's (premature ventricular contractions) 11/01/2013   Status post insertion of drug eluting coronary artery stent to [facility] emergently and PTCA to prox. PLA 09/16/2013   Hyperlipidemia associated with type 2 diabetes mellitus ([place]) 09/16/2013   Presence of coronary angioplasty  implant and graft 09/08/2013   Panhypopituitarism ([place]) 08/30/2013   Non-ST elevation (NSTEMI) myocardial infarction ([place]) 08/22/2013   CAD S/P percutaneous coronary angioplasty 08/22/2013    REFERRING DIAG: [id number] (ICD-10-CM) - Hx of BKA, left    ONSET DATE:  03/19/2022 prosthesis delivery   THERAPY DIAG:  Other abnormalities of gait and mobility  Unsteadiness on feet  Pain in left lower leg  Muscle weakness (generalized)  Rationale for Evaluation and Treatment Rehabilitation  PERTINENT HISTORY: CAD, STEMI, CHF, PAD, A-Fib, IDDM2, severe septic arthritis   PRECAUTIONS: Fall  SUBJECTIVE:  SUBJECTIVE STATEMENT:   She purchased a rollator walker with gift cards with expected delivery 05/16/2022.  She drove for first time since amputation with no issues except stepping up step to get into house.    PAIN:  Are you having pain? Yes: NPRS scale:  today 0/10 and in last week 0/10 Pain location: left distal tibia, posterior knee & distal limb.  Pain description: sore Aggravating factors: walking & standing.  Relieving factors: taking prosthesis off   Back pain lumbar: 0/10 today & in last week 6-7/10 walking or standing            OBJECTIVE: (objective measures completed at initial evaluation unless otherwise dated)  COGNITION: 04/21/2022:  Overall cognitive status: Within functional limits for tasks assessed   POSTURE: 04/21/2022:  rounded shoulders, forward head, flexed trunk , and weight shift right   LOWER EXTREMITY ROM:   ROM P:passive  A:active Left Eval 04/21/22:  Hip flexion WFL  Hip extension WFL  Hip abduction WFL  Hip adduction    Hip internal rotation    Hip external rotation    Knee flexion 90*  Knee extension -5*  Ankle dorsiflexion NA  Ankle plantarflexion NA  Ankle inversion  NA  Ankle eversion NA   (Blank rows = not tested)   LOWER EXTREMITY MMT:   MMT Left Eval 04/21/22  Hip flexion 4/5  Hip extension 3+/5  Hip abduction 3+/5  Hip adduction    Hip internal rotation    Hip external rotation    Knee flexion 3+/5  Knee extension 4-/5  Ankle dorsiflexion NA  Ankle plantarflexion NA  Ankle inversion NA  Ankle eversion NA  (Blank rows = not tested)   TRANSFERS: 04/21/2022:  Sit to stand: SBA requires armrests & BUEs to push up but able to stabilize without touching RW 04/21/2022:  Stand to sit: SBA requires armrests & BUEs to control descent.    GAIT: 04/21/2022:  Gait pattern: step to pattern, decreased step length- Right, decreased stance time- Left, decreased hip/knee flexion- Left, circumduction- Left, Left hip hike, knee flexed in stance- Left, antalgic, lateral hip instability, trunk flexed, and abducted- Left Distance walked: 37' Assistive device utilized: Environmental consultant - 2 wheeled and TTA prosthesis Level of assistance: SBA  verbal cues only with RW Comments: excessive UE weight bearing on RW   FUNCTIONAL TESTs:  04/28/2022:  PT assessed vertigo:  Brayton Caves  - left cervical rotation sit to supine no nystagmus, right rotation no dizziness and rolling right. -right cervical rotation sit to supine no nystagmus, left rotation slight dizziness and rolling left slight dizziness. Nestor Lewandowsky To right light dizziness sit to supine & severe dizziness sitting up. To left light dizziness sit to supine & severe dizziness sitting up.  Orthostatic Hypotension Supine no dizziness  145/70 HR 80  Standing light dizziness 119/67 HR 108 After standing 3 min  no dizziness BP 158/76 HR 112 Positive for orthostatic Hypotension   04/21/2022: Dizziness with head motions.  Berg Balance Scale: 19/56    CURRENT PROSTHETIC WEAR ASSESSMENT:  04/21/2022: Patient is independent with: prosthetic cleaning Patient is dependent with: skin check, residual limb care,  correct ply sock adjustment, proper wear schedule/adjustment, and proper weight-bearing schedule/adjustment Donning prosthesis: SBA Doffing prosthesis: Modified independence Prosthetic wear tolerance: ~14 hours total between 2x/day, 7 days/week Prosthetic weight bearing tolerance: 5 minutes with partial weight on prosthesis with limb pain 8/10 Edema: pitting Residual limb condition: no open areas but one small area distal tibia that may be  internal suture working out,  shiny skin with no hair growth, redness over patella & distal tibia, no temperature issues noted. Cylindrical shape Prosthetic description: silicon liner with pin lock suspension, total contact socket with flexible inner socket, dynamic response foot/ankle       TODAY'S TREATMENT:                                                                                                                             DATE:  05/14/2022: Prosthetic Training with Transtibial Prosthesis: Pt amb 75' X 2 with SBQC & HHA minA. Pt amb 75' X 2 with cane stand alone tip & HHA minA. More pressure thru PT hand, less LE stance stability & clearance with cane stand alone tip. PT demo & verbal cues on difference bw LBQC, SBQC & cane stand alone tip. PT goal is household gait with cane to enable free hand to carry items.   Pt amb near counter hovering LUE over counter with RUE SBQC with supervison. Walking backwards with LUE touching counter & SBQC with supervision. PT verbally educated pt on fall risk when prosthesis is off. Pt verbalized understanding.    05/12/2022: Prosthetic Training with Transtibial Prosthesis: Continue wear all awake hours. PT looked at printout of rollator walker that she wants to purchase and it looks like it would work. Pt verbalized understanding.  Neuromuscular Re-education:  Standing at sink with chair back on each side with chair behind:  Standing with back to sink for upright posture getting upper body over feet /  pelvis.  Reaching both hands over head with slight back ext. 5 reps  Trunk rotation right & left moving item with both hands from counter to each chair back 5 reps  Side bend placing water bottle in seat 5 reps to ea side.  Controlled flexion to upright placing hands in chair seat 5 reps PT educated on above as HEP to improve back strength & endurance. Increasing reps or number of each exercise before rest seated. Today she needed rest before ea exercise.    Therapeutic exercise: Nustep seat 9 level 5 with BLEs & BUEs 8 min PT educated on using seated recumbent machine that uses arms & legs at [facility] or local fitness center.  Starting 5 min work, 5 min rest, 3 sets.  As able change to 6 min work, 4 min rest ...  Pt verbalized understanding including recommendation to call [employer] to see if her plan covers membership.     05/07/2022: Prosthetic Training with Transtibial Prosthesis: Gait with SPC in Rt UE, 100 feet X 2 with CGA.  Ambulated 3 stairs in clinic hall up/down with UE support on handrail on Rt going down and on left going up as it would be at her home. She had SPC in other hand and needed cues and demo for proper technique. Able to perform with CGA-min A Gait with rollator 100 feet X 2 working on 180 deg turns and sitting/standing from  rollator.  Neuromuscular Re-education: In corner with RW in front for safety:  -on floor eyes open hip width stance head motions right/left, up/down & diagonals 10 reps ea. -on floor eyes closed hip width stance head motions right/left, up/down & diagonals 10 reps ea. -on floor with hip width stance eyes closed 10 sec X 5    HOME EXERCISE PROGRAM: Access Code: [email or web address] URL: [email or web address] Date: 04/30/2022 Prepared by: Jamey Reas  Exercises - wide stance head motions eyes open  - 1 x daily - 4 x weekly - 1 sets - 10 reps - 2 seconds hold - Feet Apart with Eyes Closed with Head Motions  - 1 x daily - 4 x weekly - 1 sets - 10  reps - 2 seconds hold - stand on foam eyes open   - 1 x daily - 4 x weekly - 1 sets - 10 reps - 2 seconds hold    Do each exercise 5-10 repetitions Hold each exercise for 2 seconds to feel your location  AT [place].  Try to find this position when standing still for activities.   USE TAPE ON FLOOR TO MARK THE MIDLINE POSITION which is even with middle of sink.  You also should try to feel with your limb pressure in socket.  You are trying to feel with limb what you used to feel with the bottom of your foot.  Side to Side Shift: Moving your hips only (not shoulders): move weight onto your left leg, HOLD/FEEL pressure in socket.  Move back to equal weight on each leg, HOLD/FEEL pressure in socket. Move weight onto your right leg, HOLD/FEEL pressure in socket. Move back to equal weight on each leg, HOLD/FEEL pressure in socket. Repeat.  Start with both hands on sink, progress to hand on prosthetic side only, then no hands.  Front to Back Shift: Moving your hips only (not shoulders): move your weight forward onto your toes, HOLD/FEEL pressure in socket. Move your weight back to equal Flat Foot on both legs, HOLD/FEEL  pressure in socket. Move your weight back onto your heels, HOLD/FEEL  pressure in socket. Move your weight back to equal on both legs, HOLD/FEEL  pressure in socket. Repeat.  Start with both hands on sink, progress to hand on prosthetic side only, then no hands.  Moving Cones / Cups: With equal weight on each leg: Hold on with one hand the first time, then progress to no hand supports. Move cups from one side of sink to the other. Place cups ~2" out of your reach, progress to 10" beyond reach.  Place one hand in middle of sink and reach with other hand. Do both arms.  Then hover one hand and move cups with other hand.  Overhead/Upward Reaching: alternated reaching up to top cabinets or ceiling if no cabinets present.  Keep equal weight on each leg. Start with one hand support on counter while other hand reaches and progress to no hand support with reaching.  ace one hand in middle of sink and reach with other hand. Do both arms.  Then hover one hand and move cups with other hand.  5.   Looking Over Shoulders: With equal weight on each leg: alternate turning to look over your shoulders with one hand support on counter as needed.  Start with head motions only to look in front of shoulder, then even with shoulder and progress to looking behind you. To  look to side, move head /eyes, then shoulder on side looking pulls back, shift more weight to side looking and pull hip back. Place one hand in middle of sink and let go with other hand so your shoulder can pull back. Switch hands to look other way.   Then hover one hand and look over shoulder. If looking right, use left hand at sink. If looking left, use right hand at sink. 6.  Stepping with leg that is not amputated:  Move items under cabinet out of your way. Shift your hips/pelvis so weight on prosthesis. Tighten muscles in hip on prosthetic side.  SLOWLY step other leg so front of foot is in cabinet. Then step back to floor.   Back & standing endurance HEP on 05/12/22 Stand at sink with chair back on your right & left and w/c or locked rollator behind you.  Place back of pelvis against counter with feet under your pelvis.  Rest your hands on counter beside you.  Time how long you can stand upright.  Try to increase time over days & weeks.  Stand facing w/c lean forward placing both hands in seat & stand back up straight, then lean forward hovering hands over seat Facing sink: Rotate upper body to place item on back of each chair on right & left. Side bend placing water bottle in each seat. Reach over head following hands with eyes.     ASSESSMENT:  CLINICAL IMPRESSION: Patient appears on target to meet STGs next week. She appears to have potential to use [facility] in home  with training to improve independence.  Pt continues to benefit from skilled PT.    OBJECTIVE IMPAIRMENTS: Abnormal gait, decreased activity tolerance, decreased balance, decreased endurance, decreased knowledge of condition, decreased knowledge of use of DME, decreased mobility, difficulty walking, decreased ROM, decreased strength, increased edema, impaired flexibility, postural dysfunction, prosthetic dependency , obesity, and pain.    ACTIVITY LIMITATIONS: carrying, lifting, bending, standing, stairs, transfers, and locomotion level   PARTICIPATION LIMITATIONS: meal prep, cleaning, laundry, driving, community activity, and church   PERSONAL FACTORS: Fitness, Time since onset of injury/illness/exacerbation, and 3+ comorbidities: see PMH  are also affecting patient's functional outcome.    REHAB POTENTIAL: Good   CLINICAL DECISION MAKING: Evolving/moderate complexity   EVALUATION COMPLEXITY: Moderate     GOALS: Goals reviewed with patient? Yes   SHORT TERM GOALS: Target date: 05/22/2022   Patient donnes prosthesis modified independent & verbalizes proper cleaning. Baseline: SEE OBJECTIVE DATA Goal status: ongoing 05/05/2022 2.  Patient tolerates prosthesis >12 hrs total /day without skin issues or limb pain <5/10 after standing. Baseline: SEE OBJECTIVE DATA Goal status: ongoing 05/05/2022   3. Berg Balance >/= 23/56. Baseline: SEE OBJECTIVE DATA Goal status: ongoing 05/05/2022   4. Patient ambulates 68' with RW & prosthesis with supervision. Baseline: SEE OBJECTIVE DATA Goal status: ongoing 05/05/2022   5. Patient negotiates ramps & curbs with RW & prosthesis with supervision. Baseline: SEE OBJECTIVE DATA Goal status: ongoing 05/05/2022   LONG TERM GOALS: Target date: 07/17/2022   Patient demonstrates & verbalized understanding of prosthetic care to enable safe utilization of prosthesis. Baseline: SEE OBJECTIVE DATA Goal status: INITIAL   Patient tolerates prosthesis wear  >90% of awake hours without skin or limb pain issues. Baseline: SEE OBJECTIVE DATA Goal status: INITIAL   Berg Balance >36/56 to indicate lower fall risk Baseline: SEE OBJECTIVE DATA Goal status: INITIAL   Patient ambulates >300' with prosthesis & LRADindependently Baseline: SEE OBJECTIVE DATA Goal  status: INITIAL   Patient negotiates ramps, curbs with LRAD & stairs with single rail with prosthesis independently. Baseline: SEE OBJECTIVE DATA Goal status: INITIAL   Patient verbalizes & demonstrates understanding of HEP Baseline: SEE OBJECTIVE DATA Goal status: INITIAL   PLAN:   PT FREQUENCY: 2x/week   PT DURATION: 12 weeks   PLANNED INTERVENTIONS: Therapeutic exercises, Therapeutic activity, Neuromuscular re-education, Balance training, Gait training, Patient/Family education, Self Care, Stair training, Vestibular training, Canalith repositioning, Prosthetic training, DME instructions, and physical performance training   PLAN FOR NEXT SESSION: check STGs next week,   continue gait with [facility] for household mobility. Standing balance activities.     Jamey Reas, PT, DPT 05/14/2022, 4:46 PM

## 2022-05-19 ENCOUNTER — Encounter: Payer: Medicare Other | Admitting: Physical Therapy

## 2022-05-19 NOTE — Therapy (Incomplete)
OUTPATIENT PHYSICAL THERAPY TREATMENT NOTE   Patient Name: Tammy Good MRN: [id number] DOB:1950/06/16, 72 y.o., female Today's Date: 05/19/2022  PCP: general provider REFERRING PROVIDER: Melony Overly, MD   END OF SESSION:         Past Medical History:  Diagnosis Date   CAD S/P percutaneous coronary angioplasty 08/2013   100% mRCA - PCI Integrity Resolute DES 3.0 mm x 38 mm - 3.35 mm; PTCA of RPA V 2.0 mm x 15 mm   CHF (congestive heart failure) ([place])    Cholesteatoma    right   Diabetes mellitus type 2 in obese ([place])    On insulin and Invokana   History of osteomyelitis L 5th Toe all 05/2019   s/p Partial Ray Amputation with partial closure; 6 wks Abx & LSFA Atherectomy/DEB PTA with Stent for focal dissection.   Hyperlipidemia with target LDL less than 70    Hypothyroidism (acquired)    Mild essential hypertension    Obesity (BMI 30-39.9) 11/17/2013   PAD (peripheral artery disease) ([place]) 05/26/2019   05/26/19: Abd AoGram- BLE runoff -> L SFA orbital atherectomy - PTA w/ DEB & Stent 6 x 40 Luttonix (for focal dissection) - patent Pop A with 3 V runoff. LEA Dopplers 01/03/2020: RABI (prev) 0.68 (0.53)/ RTBI (prev) 0.58 (0.33); LABI (prev) 0.80 (0.64), LTBI (prev) 0.64 (0.51); R mSFA ~50-74%, L mSFA 50-74%. Patent Prox SFA stent < 49% stenosis   Panhypopituitarism (HCC)    ST elevation myocardial infarction (STEMI) of inferior wall, subsequent episode of care ([place]) 08/2013   80% branch of D1, 40% mid AV groove circumflex, 100% RCA with subacute thrombus -- thrombus extending into RPA V with 100% occlusion after initial angioplasty of mid RCA ;; Post MI ECHO 6/9/'15: EF 50-55%, mild LVH with moderate HK of inferior wall, Gr1 DD, mild LA dilation; mildly reduced RV function   Past Surgical History:  Procedure Laterality Date   ABDOMINAL AORTOGRAM W/LOWER EXTREMITY N/A 05/26/2019   Procedure: ABDOMINAL AORTOGRAM W/LOWER EXTREMITY;  Surgeon: Marty Heck, MD;   Location: [place] [facility];  Service: Cardiovascular;  Laterality: N/A;   ABDOMINAL AORTOGRAM W/LOWER EXTREMITY N/A 11/15/2020   Procedure: ABDOMINAL AORTOGRAM W/LOWER EXTREMITY;  Surgeon: Marty Heck, MD;  Location: [place] [facility];  Service: Cardiovascular;  Laterality: N/A;   ABDOMINAL AORTOGRAM W/LOWER EXTREMITY N/A 04/18/2021   Procedure: ABDOMINAL AORTOGRAM W/LOWER EXTREMITY;  Surgeon: Marty Heck, MD;  Location: [place] [facility];  Service: Cardiovascular;  Laterality: N/A;   ABDOMINAL AORTOGRAM W/LOWER EXTREMITY Left 07/11/2021   Procedure: ABDOMINAL AORTOGRAM W/LOWER EXTREMITY;  Surgeon: Marty Heck, MD;  Location: [place] [facility];  Service: Cardiovascular;  Laterality: Left;   AMPUTATION Left 05/25/2019   Procedure: AMPUTATION RAY 5th;  Surgeon: Trula Slade, DPM;  Location: [place];  Service: Podiatry;  Laterality: Left;   AMPUTATION Left 01/14/2022   Procedure: LEFT BELOW THE KNEE AMPUTATION;  Surgeon: Erle Crocker, MD;  Location: [place];  Service: Orthopedics;  Laterality: Left;  LENGTH OF SURGERY: 90 MINUTES   APPLICATION OF WOUND VAC Left 01/14/2022   Procedure: WOUND VAC PLACEMENT;  Surgeon: Erle Crocker, MD;  Location: [place];  Service: Orthopedics;  Laterality: Left;   BONE BIOPSY Left 04/24/2021   Procedure: BONE BIOPSY;  Surgeon: Landis Martins, DPM;  Location: [place];  Service: Podiatry;  Laterality: Left;  left ankle/fibula   BONE BIOPSY Left 07/15/2021   Procedure: LEFT FOOT BONE BIOPSY;  Surgeon: [name],  Bonna Gains, DPM;  Location: [place];  Service: Podiatry;  Laterality: Left;   Cardiac Event Monitor  July-August 2015   Sinus rhythm with PVCs   CHOLECYSTECTOMY     COLONOSCOPY N/A 08/31/2013   Procedure: COLONOSCOPY;  Surgeon: Juanita Craver, MD;  Location: [facility] ENDOS[facility];  Service: Endoscopy;  Laterality: N/A;   CORONARY STENT INTERVENTION N/A 11/19/2020   Procedure: CORONARY STENT INTERVENTION;  Surgeon: Nelva Bush, MD;  Location: [place] [facility];  Service: Cardiovascular;  Laterality: N/A;   ESOPHAGOGASTRODUODENOSCOPY N/A 09/01/2013   Procedure: ESOPHAGOGASTRODUODENOSCOPY (EGD);  Surgeon: Beryle Beams, MD;  Location: [facility] ENDOSCOPY;  Service: Endoscopy;  Laterality: N/A;  bedside   ESOPHAGOGASTRODUODENOSCOPY (EGD) WITH PROPOFOL N/A 05/26/2021   Procedure: ESOPHAGOGASTRODUODENOSCOPY (EGD) WITH PROPOFOL;  Surgeon: Carol Ada, MD;  Location: [place];  Service: Gastroenterology;  Laterality: N/A;   EYE SURGERY Bilateral    bilateral cataracts   INCISION AND DRAINAGE Left 04/24/2021   Procedure: INCISION AND DRAINAGE;  Surgeon: Landis Martins, DPM;  Location: [place];  Service: Podiatry;  Laterality: Left;   IRRIGATION AND DEBRIDEMENT FOOT Left 07/15/2021   Procedure: IRRIGATION AND DEBRIDEMENT FOOT;  Surgeon: Trula Slade, DPM;  Location: [place];  Service: Podiatry;  Laterality: Left;   LEFT HEART CATH AND CORONARY ANGIOGRAPHY N/A 11/19/2020   Procedure: LEFT HEART CATH AND CORONARY ANGIOGRAPHY;  Surgeon: Nelva Bush, MD;  Location: [place] [facility];  Service: Cardiovascular;  Laterality: N/A;   LEFT HEART CATHETERIZATION WITH CORONARY ANGIOGRAM N/A 08/30/2013   Procedure: LEFT HEART CATHETERIZATION WITH CORONARY ANGIOGRAM;  Surgeon: Leonie Man, MD;  Location: [facility] CATH LAB: 100% mRCA (thrombus - extends to RPAV), 80% D1, 40% AVG Cx.   PERCUTANEOUS CORONARY STENT INTERVENTION (PCI-S)  08/30/2013   Procedure: PERCUTANEOUS CORONARY STENT INTERVENTION (PCI-S);  Surgeon: Leonie Man, MD;  Location: [facility] CATH LAB;  Integrity Resolute DES 2.0 mm x 38 mm -- 3.35 mm.; PTCA of proximal RPA V. - 3.0 mm x 15 mm balloon   PERIPHERAL VASCULAR ATHERECTOMY  05/26/2019   Procedure: PERIPHERAL VASCULAR ATHERECTOMY;  Surgeon: Marty Heck, MD;  Location: [place] [facility];  Service: Cardiovascular;;  Left SFA   PERIPHERAL VASCULAR BALLOON ANGIOPLASTY Left 07/11/2021   Procedure:  PERIPHERAL VASCULAR BALLOON ANGIOPLASTY;  Surgeon: Marty Heck, MD;  Location: [place] [facility];  Service: Cardiovascular;  Laterality: Left;  PT TRUNK / AT   PERIPHERAL VASCULAR INTERVENTION  05/26/2019   Procedure: PERIPHERAL VASCULAR INTERVENTION;  Surgeon: Marty Heck, MD;  Location: [place] [facility];  Service: Cardiovascular;;  Left SFA   PERIPHERAL VASCULAR INTERVENTION Right 11/15/2020   Procedure: PERIPHERAL VASCULAR INTERVENTION;  Surgeon: Marty Heck, MD;  Location: [place] [facility];  Service: Cardiovascular;  Laterality: Right;  Superficial Femoral Artery   PERIPHERAL VASCULAR INTERVENTION Left 07/11/2021   Procedure: PERIPHERAL VASCULAR INTERVENTION;  Surgeon: Marty Heck, MD;  Location: [place] [facility];  Service: Cardiovascular;  Laterality: Left;  SFA   PITUITARY SURGERY     TEE WITHOUT CARDIOVERSION N/A 04/26/2021   Procedure: TRANSESOPHAGEAL ECHOCARDIOGRAM (TEE);  Surgeon: Buford Dresser, MD;  Location: [facility] ENDOSCOPY;  Service: Cardiovascular;  Laterality: N/A;   TRANSTHORACIC ECHOCARDIOGRAM  08/30/2013   mild LVH. EF 50-55%. Moderate HK of the entire inferior myocardium. GR 1 DD. Mild LA dilation. Mildly reduced RV function   TYMPANOMASTOIDECTOMY Right 12/28/2017   Procedure: RIGHT TYMPANOMASTOIDECTOMY;  Surgeon: Leta Baptist, MD;  Location: [place];  Service: ENT;  Laterality: Right;   Patient Active Problem List   Diagnosis Date Noted   Nocturia more than twice per night 02/03/2022   Unilateral complete BKA, left, initial encounter ([place]) 01/17/2022   Osteomyelitis ([place]) 01/13/2022   Normocytic anemia 01/13/2022   Atrial fibrillation ([place]) 08/15/2021   Septic arthritis of foot ([place]) 08/02/2021   Medication monitoring encounter 08/02/2021   Acute on chronic systolic CHF (congestive heart failure) ([place]) 06/02/2021   Hypothyroidism 06/02/2021   Ulcer of esophagus without bleeding    Pressure injury of skin  05/31/2021   Hypokalemia 05/30/2021   Symptomatic anemia 05/29/2021   AKI (acute kidney injury) ([place]) 05/29/2021   GI bleed 05/29/2021   Liver lesion 05/29/2021   Vertigo 05/24/2021   Generalized weakness 05/24/2021   Tenosynovitis of left ankle 04/25/2021   Thrombocytosis 04/23/2021   Fatty liver 06/19/2020   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 ([place]) [id number]   Atherosclerotic heart disease of native coronary artery without angina pectoris 03/20/2020   Epigastric pain 03/20/2020   Nausea 03/20/2020   Absence of toe ([place]) 06/28/2019   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   PAD (peripheral artery disease) ([place]) 05/26/2019   Osteomyelitis of left foot ([place]) 05/24/2019   Cellulitis and abscess of foot, except toes 05/18/2019   Chronic osteomyelitis of ankle and foot ([place]) 05/18/2019   Cholesteatoma 02/24/2019   Cellulitis and abscess of toe 11/09/2018   Colon cancer screening 11/30/2017   Long term (current) use of insulin ([place]) 03/05/2017   Iron deficiency anemia 12/10/2014   Obesity (BMI 30-39.9) 11/17/2013   Diabetes mellitus, type 2 ([place])    Essential hypertension    Right thigh pain 11/01/2013   PVC's (premature ventricular contractions) 11/01/2013   Status post insertion of drug eluting coronary artery stent to [facility] emergently and PTCA to prox. PLA 09/16/2013   Hyperlipidemia associated with type 2 diabetes mellitus ([place]) 09/16/2013   Presence of coronary angioplasty implant and graft 09/08/2013   Panhypopituitarism ([place]) 08/30/2013   Non-ST elevation (NSTEMI) myocardial infarction [facility]) 08/22/2013   CAD S/P percutaneous coronary angioplasty 08/22/2013    REFERRING DIAG: [id number] (ICD-10-CM) - Hx of BKA, left    ONSET DATE:  03/19/2022 prosthesis delivery   THERAPY DIAG:  No diagnosis found.  Rationale for Evaluation and Treatment Rehabilitation  PERTINENT HISTORY: CAD, STEMI, CHF, PAD, A-Fib, IDDM2, severe septic arthritis    PRECAUTIONS: Fall  SUBJECTIVE:                                                                                                                                                                                     SUBJECTIVE STATEMENT:   ***  She purchased a rollator walker with gift cards with expected delivery 05/16/2022.  She drove for first time since amputation with no issues except stepping up step to get into house.    PAIN:  Are you having pain? Yes: NPRS scale:  today 0/10 and in last week 0/10 Pain location: left distal tibia, posterior knee & distal limb.  Pain description: sore Aggravating factors: walking & standing.  Relieving factors: taking prosthesis off   Back pain lumbar: 0/10 today & in last week *** 6-7/10 walking or standing            OBJECTIVE: (objective measures completed at initial evaluation unless otherwise dated)  COGNITION: 04/21/2022:  Overall cognitive status: Within functional limits for tasks assessed   POSTURE: 04/21/2022:  rounded shoulders, forward head, flexed trunk , and weight shift right   LOWER EXTREMITY ROM:   ROM P:passive  A:active Left Eval 04/21/22:  Hip flexion WFL  Hip extension WFL  Hip abduction WFL  Hip adduction    Hip internal rotation    Hip external rotation    Knee flexion 90*  Knee extension -5*  Ankle dorsiflexion NA  Ankle plantarflexion NA  Ankle inversion NA  Ankle eversion NA   (Blank rows = not tested)   LOWER EXTREMITY MMT:   MMT Left Eval 04/21/22  Hip flexion 4/5  Hip extension 3+/5  Hip abduction 3+/5  Hip adduction    Hip internal rotation    Hip external rotation    Knee flexion 3+/5  Knee extension 4-/5  Ankle dorsiflexion NA  Ankle plantarflexion NA  Ankle inversion NA  Ankle eversion NA  (Blank rows = not tested)   TRANSFERS: 04/21/2022:  Sit to stand: SBA requires armrests & BUEs to push up but able to stabilize without touching RW 04/21/2022:  Stand to sit: SBA requires armrests & BUEs  to control descent.    GAIT: 04/21/2022:  Gait pattern: step to pattern, decreased step length- Right, decreased stance time- Left, decreased hip/knee flexion- Left, circumduction- Left, Left hip hike, knee flexed in stance- Left, antalgic, lateral hip instability, trunk flexed, and abducted- Left Distance walked: 59' Assistive device utilized: Environmental consultant - 2 wheeled and TTA prosthesis Level of assistance: SBA  verbal cues only with RW Comments: excessive UE weight bearing on RW   FUNCTIONAL TESTs:  04/28/2022:  PT assessed vertigo:  Brayton Caves  - left cervical rotation sit to supine no nystagmus, right rotation no dizziness and rolling right. -right cervical rotation sit to supine no nystagmus, left rotation slight dizziness and rolling left slight dizziness. Nestor Lewandowsky To right light dizziness sit to supine & severe dizziness sitting up. To left light dizziness sit to supine & severe dizziness sitting up.  Orthostatic Hypotension Supine no dizziness  145/70 HR 80  Standing light dizziness 119/67 HR 108 After standing 3 min  no dizziness BP 158/76 HR 112 Positive for orthostatic Hypotension   04/21/2022: Dizziness with head motions.  Berg Balance Scale: 19/56    CURRENT PROSTHETIC WEAR ASSESSMENT:  04/21/2022: Patient is independent with: prosthetic cleaning Patient is dependent with: skin check, residual limb care, correct ply sock adjustment, proper wear schedule/adjustment, and proper weight-bearing schedule/adjustment Donning prosthesis: SBA Doffing prosthesis: Modified independence Prosthetic wear tolerance: ~14 hours total between 2x/day, 7 days/week Prosthetic weight bearing tolerance: 5 minutes with partial weight on prosthesis with limb pain 8/10 Edema: pitting Residual limb condition: no open areas but one small area distal tibia that may be internal suture working out,  shiny skin with no hair growth, redness over patella & distal tibia, no temperature issues noted.  Cylindrical shape Prosthetic description: silicon liner with pin lock suspension, total contact socket with flexible inner socket, dynamic response foot/ankle       TODAY'S TREATMENT:                                                                                                                             DATE:  05/19/2022: ***  05/14/2022: Prosthetic Training with Transtibial Prosthesis: Pt amb 75' X 2 with SBQC & HHA minA. Pt amb 75' X 2 with cane stand alone tip & HHA minA. More pressure thru PT hand, less LE stance stability & clearance with cane stand alone tip. PT demo & verbal cues on difference bw LBQC, SBQC & cane stand alone tip. PT goal is household gait with cane to enable free hand to carry items.   Pt amb near counter hovering LUE over counter with RUE SBQC with supervison. Walking backwards with LUE touching counter & SBQC with supervision. PT verbally educated pt on fall risk when prosthesis is off. Pt verbalized understanding.    05/12/2022: Prosthetic Training with Transtibial Prosthesis: Continue wear all awake hours. PT looked at printout of rollator walker that she wants to purchase and it looks like it would work. Pt verbalized understanding.  Neuromuscular Re-education:  Standing at sink with chair back on each side with chair behind:  Standing with back to sink for upright posture getting upper body over feet / pelvis.  Reaching both hands over head with slight back ext. 5 reps  Trunk rotation right & left moving item with both hands from counter to each chair back 5 reps  Side bend placing water bottle in seat 5 reps to ea side.  Controlled flexion to upright placing hands in chair seat 5 reps PT educated on above as HEP to improve back strength & endurance. Increasing reps or number of each exercise before rest seated. Today she needed rest before ea exercise.    Therapeutic exercise: Nustep seat 9 level 5 with BLEs & BUEs 8 min PT educated on using seated  recumbent machine that uses arms & legs at [facility] or local fitness center.  Starting 5 min work, 5 min rest, 3 sets.  As able change to 6 min work, 4 min rest ...  Pt verbalized understanding including recommendation to call [employer] to see if her plan covers membership.    HOME EXERCISE PROGRAM: Access Code: [email or web address] URL: [email or web address] Date: 04/30/2022 Prepared by: Jamey Reas  Exercises - wide stance head motions eyes open  - 1 x daily - 4 x weekly - 1 sets - 10 reps - 2 seconds hold - Feet Apart with Eyes Closed with Head Motions  - 1 x daily - 4 x weekly - 1 sets - 10 reps - 2 seconds hold - stand on foam eyes open   - 1 x daily - 4  x weekly - 1 sets - 10 reps - 2 seconds hold    Do each exercise 5-10 repetitions Hold each exercise for 2 seconds to feel your location  AT [place].  Try to find this position when standing still for activities.   USE TAPE ON FLOOR TO MARK THE MIDLINE POSITION which is even with middle of sink.  You also should try to feel with your limb pressure in socket.  You are trying to feel with limb what you used to feel with the bottom of your foot.  Side to Side Shift: Moving your hips only (not shoulders): move weight onto your left leg, HOLD/FEEL pressure in socket.  Move back to equal weight on each leg, HOLD/FEEL pressure in socket. Move weight onto your right leg, HOLD/FEEL pressure in socket. Move back to equal weight on each leg, HOLD/FEEL pressure in socket. Repeat.  Start with both hands on sink, progress to hand on prosthetic side only, then no hands.  Front to Back Shift: Moving your hips only (not shoulders): move your weight forward onto your toes, HOLD/FEEL pressure in socket. Move your weight back to equal Flat Foot on both legs, HOLD/FEEL  pressure in socket. Move your weight back onto your heels, HOLD/FEEL  pressure in socket. Move your weight back to equal on  both legs, HOLD/FEEL  pressure in socket. Repeat.  Start with both hands on sink, progress to hand on prosthetic side only, then no hands.  Moving Cones / Cups: With equal weight on each leg: Hold on with one hand the first time, then progress to no hand supports. Move cups from one side of sink to the other. Place cups ~2" out of your reach, progress to 10" beyond reach.  Place one hand in middle of sink and reach with other hand. Do both arms.  Then hover one hand and move cups with other hand.  Overhead/Upward Reaching: alternated reaching up to top cabinets or ceiling if no cabinets present. Keep equal weight on each leg. Start with one hand support on counter while other hand reaches and progress to no hand support with reaching.  ace one hand in middle of sink and reach with other hand. Do both arms.  Then hover one hand and move cups with other hand.  5.   Looking Over Shoulders: With equal weight on each leg: alternate turning to look over your shoulders with one hand support on counter as needed.  Start with head motions only to look in front of shoulder, then even with shoulder and progress to looking behind you. To look to side, move head /eyes, then shoulder on side looking pulls back, shift more weight to side looking and pull hip back. Place one hand in middle of sink and let go with other hand so your shoulder can pull back. Switch hands to look other way.   Then hover one hand and look over shoulder. If looking right, use left hand at sink. If looking left, use right hand at sink. 6.  Stepping with leg that is not amputated:  Move items under cabinet out of your way. Shift your hips/pelvis so weight on prosthesis. Tighten muscles in hip on prosthetic side.  SLOWLY step other leg so front of foot is in cabinet. Then step back to floor.   Back & standing endurance HEP on 05/12/22 Stand at sink with chair back on your right & left and w/c  or locked rollator behind you.  Place back of pelvis  against counter with feet under your pelvis.  Rest your hands on counter beside you.  Time how long you can stand upright.  Try to increase time over days & weeks.  Stand facing w/c lean forward placing both hands in seat & stand back up straight, then lean forward hovering hands over seat Facing sink: Rotate upper body to place item on back of each chair on right & left. Side bend placing water bottle in each seat. Reach over head following hands with eyes.     ASSESSMENT:  CLINICAL IMPRESSION: *** Patient appears on target to meet STGs next week. She appears to have potential to use [facility] in home with training to improve independence.  Pt continues to benefit from skilled PT.    OBJECTIVE IMPAIRMENTS: Abnormal gait, decreased activity tolerance, decreased balance, decreased endurance, decreased knowledge of condition, decreased knowledge of use of DME, decreased mobility, difficulty walking, decreased ROM, decreased strength, increased edema, impaired flexibility, postural dysfunction, prosthetic dependency , obesity, and pain.    ACTIVITY LIMITATIONS: carrying, lifting, bending, standing, stairs, transfers, and locomotion level   PARTICIPATION LIMITATIONS: meal prep, cleaning, laundry, driving, community activity, and church   PERSONAL FACTORS: Fitness, Time since onset of injury/illness/exacerbation, and 3+ comorbidities: see PMH  are also affecting patient's functional outcome.    REHAB POTENTIAL: Good   CLINICAL DECISION MAKING: Evolving/moderate complexity   EVALUATION COMPLEXITY: Moderate     GOALS: Goals reviewed with patient? Yes   SHORT TERM GOALS: Target date: 05/22/2022   Patient donnes prosthesis modified independent & verbalizes proper cleaning. Baseline: SEE OBJECTIVE DATA Goal status: ongoing 05/05/2022 2.  Patient tolerates prosthesis >12 hrs total /day without skin issues or limb pain <5/10 after standing. Baseline: SEE OBJECTIVE DATA Goal status: ongoing  05/05/2022   3. Berg Balance >/= 23/56. Baseline: SEE OBJECTIVE DATA Goal status: ongoing 05/05/2022   4. Patient ambulates 49' with RW & prosthesis with supervision. Baseline: SEE OBJECTIVE DATA Goal status: ongoing 05/05/2022   5. Patient negotiates ramps & curbs with RW & prosthesis with supervision. Baseline: SEE OBJECTIVE DATA Goal status: ongoing 05/05/2022   LONG TERM GOALS: Target date: 07/17/2022   Patient demonstrates & verbalized understanding of prosthetic care to enable safe utilization of prosthesis. Baseline: SEE OBJECTIVE DATA Goal status: INITIAL   Patient tolerates prosthesis wear >90% of awake hours without skin or limb pain issues. Baseline: SEE OBJECTIVE DATA Goal status: INITIAL   Berg Balance >36/56 to indicate lower fall risk Baseline: SEE OBJECTIVE DATA Goal status: INITIAL   Patient ambulates >300' with prosthesis & LRADindependently Baseline: SEE OBJECTIVE DATA Goal status: INITIAL   Patient negotiates ramps, curbs with LRAD & stairs with single rail with prosthesis independently. Baseline: SEE OBJECTIVE DATA Goal status: INITIAL   Patient verbalizes & demonstrates understanding of HEP Baseline: SEE OBJECTIVE DATA Goal status: INITIAL   PLAN:   PT FREQUENCY: 2x/week   PT DURATION: 12 weeks   PLANNED INTERVENTIONS: Therapeutic exercises, Therapeutic activity, Neuromuscular re-education, Balance training, Gait training, Patient/Family education, Self Care, Stair training, Vestibular training, Canalith repositioning, Prosthetic training, DME instructions, and physical performance training   PLAN FOR NEXT SESSION: *** Do 10th visit note, check STGs next week,   continue gait with [facility] for household mobility. Standing balance activities.     Jamey Reas, PT, DPT 05/19/2022, 8:22 AM

## 2022-05-22 ENCOUNTER — Encounter: Payer: Self-pay | Admitting: Physical Therapy

## 2022-05-22 ENCOUNTER — Ambulatory Visit (INDEPENDENT_AMBULATORY_CARE_PROVIDER_SITE_OTHER): Payer: Medicare Other | Admitting: Physical Therapy

## 2022-05-22 DIAGNOSIS — R2689 Other abnormalities of gait and mobility: Secondary | ICD-10-CM

## 2022-05-22 DIAGNOSIS — M6281 Muscle weakness (generalized): Secondary | ICD-10-CM | POA: Diagnosis not present

## 2022-05-22 DIAGNOSIS — R42 Dizziness and giddiness: Secondary | ICD-10-CM

## 2022-05-22 DIAGNOSIS — R2681 Unsteadiness on feet: Secondary | ICD-10-CM

## 2022-05-22 DIAGNOSIS — M79662 Pain in left lower leg: Secondary | ICD-10-CM

## 2022-05-22 NOTE — Therapy (Signed)
OUTPATIENT PHYSICAL THERAPY TREATMENT NOTE   Patient Name: Tammy Good MRN: [id number] DOB:1950-03-28, 72 y.o., female Today's Date: 05/22/2022  PCP: general provider REFERRING PROVIDER: Melony Overly, MD   END OF SESSION:   PT End of Session - 05/22/22 0834     Visit Number 9    Number of Visits 25    Date for PT Re-Evaluation 07/17/22    Authorization Type Medicare & BCBS state health PPO    Progress Note Due on Visit 10    PT Start Time 0834    PT Stop Time 0920    PT Time Calculation (min) 46 min    Equipment Utilized During Treatment Gait belt    Activity Tolerance Patient tolerated treatment well;Patient limited by pain    Behavior During Therapy WFL for tasks assessed/performed                  Past Medical History:  Diagnosis Date   CAD S/P percutaneous coronary angioplasty 08/2013   100% mRCA - PCI Integrity Resolute DES 3.0 mm x 38 mm - 3.35 mm; PTCA of RPA V 2.0 mm x 15 mm   CHF (congestive heart failure) ([place])    Cholesteatoma    right   Diabetes mellitus type 2 in obese (HCC)    On insulin and Invokana   History of osteomyelitis L 5th Toe all 05/2019   s/p Partial Ray Amputation with partial closure; 6 wks Abx & LSFA Atherectomy/DEB PTA with Stent for focal dissection.   Hyperlipidemia with target LDL less than 70    Hypothyroidism (acquired)    Mild essential hypertension    Obesity (BMI 30-39.9) 11/17/2013   PAD (peripheral artery disease) ([place]) 05/26/2019   05/26/19: Abd AoGram- BLE runoff -> L SFA orbital atherectomy - PTA w/ DEB & Stent 6 x 40 Luttonix (for focal dissection) - patent Pop A with 3 V runoff. LEA Dopplers 01/03/2020: RABI (prev) 0.68 (0.53)/ RTBI (prev) 0.58 (0.33); LABI (prev) 0.80 (0.64), LTBI (prev) 0.64 (0.51); R mSFA ~50-74%, L mSFA 50-74%. Patent Prox SFA stent < 49% stenosis   Panhypopituitarism (HCC)    ST elevation myocardial infarction (STEMI) of inferior wall, subsequent episode of care ([place]) 08/2013   80% branch  of D1, 40% mid AV groove circumflex, 100% RCA with subacute thrombus -- thrombus extending into RPA V with 100% occlusion after initial angioplasty of mid RCA ;; Post MI ECHO 6/9/'15: EF 50-55%, mild LVH with moderate HK of inferior wall, Gr1 DD, mild LA dilation; mildly reduced RV function   Past Surgical History:  Procedure Laterality Date   ABDOMINAL AORTOGRAM W/LOWER EXTREMITY N/A 05/26/2019   Procedure: ABDOMINAL AORTOGRAM W/LOWER EXTREMITY;  Surgeon: Marty Heck, MD;  Location: [place] [facility];  Service: Cardiovascular;  Laterality: N/A;   ABDOMINAL AORTOGRAM W/LOWER EXTREMITY N/A 11/15/2020   Procedure: ABDOMINAL AORTOGRAM W/LOWER EXTREMITY;  Surgeon: Marty Heck, MD;  Location: [place] [facility];  Service: Cardiovascular;  Laterality: N/A;   ABDOMINAL AORTOGRAM W/LOWER EXTREMITY N/A 04/18/2021   Procedure: ABDOMINAL AORTOGRAM W/LOWER EXTREMITY;  Surgeon: Marty Heck, MD;  Location: [place] [facility];  Service: Cardiovascular;  Laterality: N/A;   ABDOMINAL AORTOGRAM W/LOWER EXTREMITY Left 07/11/2021   Procedure: ABDOMINAL AORTOGRAM W/LOWER EXTREMITY;  Surgeon: Marty Heck, MD;  Location: [place] [facility];  Service: Cardiovascular;  Laterality: Left;   AMPUTATION Left 05/25/2019   Procedure: AMPUTATION RAY 5th;  Surgeon: Trula Slade, DPM;  Location: [place];  Service:  Podiatry;  Laterality: Left;   AMPUTATION Left 01/14/2022   Procedure: LEFT BELOW THE KNEE AMPUTATION;  Surgeon: Erle Crocker, MD;  Location: [place];  Service: Orthopedics;  Laterality: Left;  LENGTH OF SURGERY: 90 MINUTES   APPLICATION OF WOUND VAC Left 01/14/2022   Procedure: WOUND VAC PLACEMENT;  Surgeon: Erle Crocker, MD;  Location: [place];  Service: Orthopedics;  Laterality: Left;   BONE BIOPSY Left 04/24/2021   Procedure: BONE BIOPSY;  Surgeon: Landis Martins, DPM;  Location: [place];  Service: Podiatry;  Laterality: Left;  left ankle/fibula   BONE BIOPSY Left  07/15/2021   Procedure: LEFT FOOT BONE BIOPSY;  Surgeon: Trula Slade, DPM;  Location: [place];  Service: Podiatry;  Laterality: Left;   Cardiac Event Monitor  July-August 2015   Sinus rhythm with PVCs   CHOLECYSTECTOMY     COLONOSCOPY N/A 08/31/2013   Procedure: COLONOSCOPY;  Surgeon: Juanita Craver, MD;  Location: [facility] ENDOSCOPY;  Service: Endoscopy;  Laterality: N/A;   CORONARY STENT INTERVENTION N/A 11/19/2020   Procedure: CORONARY STENT INTERVENTION;  Surgeon: Nelva Bush, MD;  Location: [place] [facility];  Service: Cardiovascular;  Laterality: N/A;   ESOPHAGOGASTRODUODENOSCOPY N/A 09/01/2013   Procedure: ESOPHAGOGASTRODUODENOSCOPY (EGD);  Surgeon: Beryle Beams, MD;  Location: [facility] ENDOSCOPY;  Service: Endoscopy;  Laterality: N/A;  bedside   ESOPHAGOGASTRODUODENOSCOPY (EGD) WITH PROPOFOL N/A 05/26/2021   Procedure: ESOPHAGOGASTRODUODENOSCOPY (EGD) WITH PROPOFOL;  Surgeon: Carol Ada, MD;  Location: [place];  Service: Gastroenterology;  Laterality: N/A;   EYE SURGERY Bilateral    bilateral cataracts   INCISION AND DRAINAGE Left 04/24/2021   Procedure: INCISION AND DRAINAGE;  Surgeon: Landis Martins, DPM;  Location: [place];  Service: Podiatry;  Laterality: Left;   IRRIGATION AND DEBRIDEMENT FOOT Left 07/15/2021   Procedure: IRRIGATION AND DEBRIDEMENT FOOT;  Surgeon: Trula Slade, DPM;  Location: [place];  Service: Podiatry;  Laterality: Left;   LEFT HEART CATH AND CORONARY ANGIOGRAPHY N/A 11/19/2020   Procedure: LEFT HEART CATH AND CORONARY ANGIOGRAPHY;  Surgeon: Nelva Bush, MD;  Location: [place] [facility];  Service: Cardiovascular;  Laterality: N/A;   LEFT HEART CATHETERIZATION WITH CORONARY ANGIOGRAM N/A 08/30/2013   Procedure: LEFT HEART CATHETERIZATION WITH CORONARY ANGIOGRAM;  Surgeon: Leonie Man, MD;  Location: [facility] CATH LAB: 100% mRCA (thrombus - extends to RPAV), 80% D1, 40% AVG Cx.   PERCUTANEOUS CORONARY STENT INTERVENTION (PCI-S)  08/30/2013    Procedure: PERCUTANEOUS CORONARY STENT INTERVENTION (PCI-S);  Surgeon: Leonie Man, MD;  Location: [facility] CATH LAB;  Integrity Resolute DES 2.0 mm x 38 mm -- 3.35 mm.; PTCA of proximal RPA V. - 3.0 mm x 15 mm balloon   PERIPHERAL VASCULAR ATHERECTOMY  05/26/2019   Procedure: PERIPHERAL VASCULAR ATHERECTOMY;  Surgeon: Marty Heck, MD;  Location: [place] [facility];  Service: Cardiovascular;;  Left SFA   PERIPHERAL VASCULAR BALLOON ANGIOPLASTY Left 07/11/2021   Procedure: PERIPHERAL VASCULAR BALLOON ANGIOPLASTY;  Surgeon: Marty Heck, MD;  Location: [place] [facility];  Service: Cardiovascular;  Laterality: Left;  PT TRUNK / AT   PERIPHERAL VASCULAR INTERVENTION  05/26/2019   Procedure: PERIPHERAL VASCULAR INTERVENTION;  Surgeon: Marty Heck, MD;  Location: [place] [facility];  Service: Cardiovascular;;  Left SFA   PERIPHERAL VASCULAR INTERVENTION Right 11/15/2020   Procedure: PERIPHERAL VASCULAR INTERVENTION;  Surgeon: Marty Heck, MD;  Location: [place] [facility];  Service: Cardiovascular;  Laterality: Right;  Superficial Femoral Artery   PERIPHERAL VASCULAR INTERVENTION Left  07/11/2021   Procedure: PERIPHERAL VASCULAR INTERVENTION;  Surgeon: Marty Heck, MD;  Location: [place] [facility];  Service: Cardiovascular;  Laterality: Left;  SFA   PITUITARY SURGERY     TEE WITHOUT CARDIOVERSION N/A 04/26/2021   Procedure: TRANSESOPHAGEAL ECHOCARDIOGRAM (TEE);  Surgeon: Buford Dresser, MD;  Location: [facility] ENDOSCOPY;  Service: Cardiovascular;  Laterality: N/A;   TRANSTHORACIC ECHOCARDIOGRAM  08/30/2013   mild LVH. EF 50-55%. Moderate HK of the entire inferior myocardium. GR 1 DD. Mild LA dilation. Mildly reduced RV function   TYMPANOMASTOIDECTOMY Right 12/28/2017   Procedure: RIGHT TYMPANOMASTOIDECTOMY;  Surgeon: Leta Baptist, MD;  Location: [place];  Service: ENT;  Laterality: Right;   Patient Active Problem List   Diagnosis Date Noted    Nocturia more than twice per night 02/03/2022   Unilateral complete BKA, left, initial encounter ([place]) 01/17/2022   Osteomyelitis ([place]) 01/13/2022   Normocytic anemia 01/13/2022   Atrial fibrillation ([place]) 08/15/2021   Septic arthritis of foot ([place]) 08/02/2021   Medication monitoring encounter 08/02/2021   Acute on chronic systolic CHF (congestive heart failure) ([place]) 06/02/2021   Hypothyroidism 06/02/2021   Ulcer of esophagus without bleeding    Pressure injury of skin 05/31/2021   Hypokalemia 05/30/2021   Symptomatic anemia 05/29/2021   AKI (acute kidney injury) ([place]) 05/29/2021   GI bleed 05/29/2021   Liver lesion 05/29/2021   Vertigo 05/24/2021   Generalized weakness 05/24/2021   Tenosynovitis of left ankle 04/25/2021   Thrombocytosis 04/23/2021   Fatty liver 06/19/2020   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 ([place]) [id number]   Atherosclerotic heart disease of native coronary artery without angina pectoris 03/20/2020   Epigastric pain 03/20/2020   Nausea 03/20/2020   Absence of toe ([place]) 06/28/2019   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   PAD (peripheral artery disease) ([place]) 05/26/2019   Osteomyelitis of left foot ([place]) 05/24/2019   Cellulitis and abscess of foot, except toes 05/18/2019   Chronic osteomyelitis of ankle and foot ([place]) 05/18/2019   Cholesteatoma 02/24/2019   Cellulitis and abscess of toe 11/09/2018   Colon cancer screening 11/30/2017   Long term (current) use of insulin ([place]) 03/05/2017   Iron deficiency anemia 12/10/2014   Obesity (BMI 30-39.9) 11/17/2013   Diabetes mellitus, type 2 ([place])    Essential hypertension    Right thigh pain 11/01/2013   PVC's (premature ventricular contractions) 11/01/2013   Status post insertion of drug eluting coronary artery stent to [facility] emergently and PTCA to prox. PLA 09/16/2013   Hyperlipidemia associated with type 2 diabetes mellitus ([place]) 09/16/2013   Presence of coronary angioplasty  implant and graft 09/08/2013   Panhypopituitarism ([place]) 08/30/2013   Non-ST elevation (NSTEMI) myocardial infarction ([place]) 08/22/2013   CAD S/P percutaneous coronary angioplasty 08/22/2013    REFERRING DIAG: [id number] (ICD-10-CM) - Hx of BKA, left    ONSET DATE:  03/19/2022 prosthesis delivery   THERAPY DIAG:  Other abnormalities of gait and mobility  Unsteadiness on feet  Pain in left lower leg  Muscle weakness (generalized)  Dizziness and giddiness  Rationale for Evaluation and Treatment Rehabilitation  PERTINENT HISTORY: CAD, STEMI, CHF, PAD, A-Fib, IDDM2, severe septic arthritis   PRECAUTIONS: Fall  SUBJECTIVE:  SUBJECTIVE STATEMENT:   She got sick with stomach virus Sunday night.      PAIN:  Are you having pain? Yes: NPRS scale:  today 0/10 and in last week 0/10 Pain location: left distal tibia, posterior knee & distal limb.  Pain description: sore Aggravating factors: walking & standing.  Relieving factors: taking prosthesis off   Back pain lumbar: 0/10 today & in last week 6-7/10 walking or standing            OBJECTIVE: (objective measures completed at initial evaluation unless otherwise dated)  COGNITION: 04/21/2022:  Overall cognitive status: Within functional limits for tasks assessed   POSTURE: 04/21/2022:  rounded shoulders, forward head, flexed trunk , and weight shift right   LOWER EXTREMITY ROM:   ROM P:passive  A:active Left Eval 04/21/22:  Hip flexion WFL  Hip extension WFL  Hip abduction WFL  Hip adduction    Hip internal rotation    Hip external rotation    Knee flexion 90*  Knee extension -5*  Ankle dorsiflexion NA  Ankle plantarflexion NA  Ankle inversion NA  Ankle eversion NA   (Blank rows = not tested)   LOWER EXTREMITY MMT:   MMT Left Eval 04/21/22   Hip flexion 4/5  Hip extension 3+/5  Hip abduction 3+/5  Hip adduction    Hip internal rotation    Hip external rotation    Knee flexion 3+/5  Knee extension 4-/5  Ankle dorsiflexion NA  Ankle plantarflexion NA  Ankle inversion NA  Ankle eversion NA  (Blank rows = not tested)   TRANSFERS: 04/21/2022:  Sit to stand: SBA requires armrests & BUEs to push up but able to stabilize without touching RW 04/21/2022:  Stand to sit: SBA requires armrests & BUEs to control descent.    GAIT: 04/21/2022:  Gait pattern: step to pattern, decreased step length- Right, decreased stance time- Left, decreased hip/knee flexion- Left, circumduction- Left, Left hip hike, knee flexed in stance- Left, antalgic, lateral hip instability, trunk flexed, and abducted- Left Distance walked: 19' Assistive device utilized: Environmental consultant - 2 wheeled and TTA prosthesis Level of assistance: SBA  verbal cues only with RW Comments: excessive UE weight bearing on RW   FUNCTIONAL TESTs:  05/22/2022:  Merrilee Jansky Balance 29/56  [facility] PT Assessment - 05/22/22 0845       Standardized Balance Assessment   Standardized Balance Assessment Berg Balance Test      Berg Balance Test   Sit to Stand Able to stand  independently using hands    Standing Unsupported Able to stand 2 minutes with supervision    Sitting with Back Unsupported but Feet Supported on Floor or Stool Able to sit safely and securely 2 minutes    Stand to Sit Controls descent by using hands    Transfers Able to transfer safely, definite need of hands    Standing Unsupported with Eyes Closed Able to stand 10 seconds with supervision    Standing Unsupported with Feet Together Able to place feet together independently and stand for 1 minute with supervision    From Standing, Reach Forward with Outstretched Arm Can reach forward >12 cm safely (5")    From Standing Position, Pick up Object from Floor Able to pick up shoe, needs supervision    From Standing Position, Turn to  Look Behind Over each Shoulder Needs supervision when turning    Turn 360 Degrees Needs assistance while turning    Standing Unsupported, Alternately Place Feet on Step/Stool Needs assistance to  keep from falling or unable to try    Standing Unsupported, One Foot in [employer] balance while stepping or standing    Standing on One Leg Unable to try or needs assist to prevent fall    Total Score 29    Berg comment: BERG  < 36 high risk for falls (close to 100%) 46-51 moderate (>50%)   37-45 significant (>80%) 52-55 lower (> 25%)              04/28/2022:  PT assessed vertigo:  Brayton Caves  - left cervical rotation sit to supine no nystagmus, right rotation no dizziness and rolling right. -right cervical rotation sit to supine no nystagmus, left rotation slight dizziness and rolling left slight dizziness. Nestor Lewandowsky To right light dizziness sit to supine & severe dizziness sitting up. To left light dizziness sit to supine & severe dizziness sitting up.  Orthostatic Hypotension Supine no dizziness  145/70 HR 80  Standing light dizziness 119/67 HR 108 After standing 3 min  no dizziness BP 158/76 HR 112 Positive for orthostatic Hypotension   04/21/2022: Dizziness with head motions.  Berg Balance Scale: 19/56    CURRENT PROSTHETIC WEAR ASSESSMENT:  04/21/2022: Patient is independent with: prosthetic cleaning Patient is dependent with: skin check, residual limb care, correct ply sock adjustment, proper wear schedule/adjustment, and proper weight-bearing schedule/adjustment Donning prosthesis: SBA Doffing prosthesis: Modified independence Prosthetic wear tolerance: ~14 hours total between 2x/day, 7 days/week Prosthetic weight bearing tolerance: 5 minutes with partial weight on prosthesis with limb pain 8/10 Edema: pitting Residual limb condition: no open areas but one small area distal tibia that may be internal suture working out,  shiny skin with no hair growth, redness over  patella & distal tibia, no temperature issues noted. Cylindrical shape Prosthetic description: silicon liner with pin lock suspension, total contact socket with flexible inner socket, dynamic response foot/ankle       TODAY'S TREATMENT:                                                                                                                             DATE:  05/22/2022: Prosthetic Training with Transtibial Prosthesis: PT recommended wearing prosthesis during all awake hours including when feeling sick.  Patient's fall risk is higher and stress on other lower extremity is higher when prosthesis is off.  When she is feeling sick she may not be as active but she needs the prosthesis available for mobility.  Patient verbalized understanding. Patient ambulated 25 feet x 8 with SBQC around cones for negotiation including 180* turns with min guard.  PT progressed to carrying a bottle of water initially close then open.   PT demo and verbal cues on using rolling walker to remove items from 1 area to another.  Patient verbalized understanding. PT demo picking up items off floor.  Patient able to return demonstration and verbalized understanding including set up to practice at home safely.   Self-Care: PT verbally  educated pt on signs of stroke and need to immediately seek medical attention if suspects. Pt verbalized understanding.   05/14/2022: Prosthetic Training with Transtibial Prosthesis: Pt amb 75' X 2 with SBQC & HHA minA. Pt amb 75' X 2 with cane stand alone tip & HHA minA. More pressure thru PT hand, less LE stance stability & clearance with cane stand alone tip. PT demo & verbal cues on difference bw LBQC, SBQC & cane stand alone tip. PT goal is household gait with cane to enable free hand to carry items.   Pt amb near counter hovering LUE over counter with RUE SBQC with supervison. Walking backwards with LUE touching counter & SBQC with supervision. PT verbally educated pt on fall risk  when prosthesis is off. Pt verbalized understanding.    05/12/2022: Prosthetic Training with Transtibial Prosthesis: Continue wear all awake hours. PT looked at printout of rollator walker that she wants to purchase and it looks like it would work. Pt verbalized understanding.  Neuromuscular Re-education:  Standing at sink with chair back on each side with chair behind:  Standing with back to sink for upright posture getting upper body over feet / pelvis.  Reaching both hands over head with slight back ext. 5 reps  Trunk rotation right & left moving item with both hands from counter to each chair back 5 reps  Side bend placing water bottle in seat 5 reps to ea side.  Controlled flexion to upright placing hands in chair seat 5 reps PT educated on above as HEP to improve back strength & endurance. Increasing reps or number of each exercise before rest seated. Today she needed rest before ea exercise.    Therapeutic exercise: Nustep seat 9 level 5 with BLEs & BUEs 8 min PT educated on using seated recumbent machine that uses arms & legs at [facility] or local fitness center.  Starting 5 min work, 5 min rest, 3 sets.  As able change to 6 min work, 4 min rest ...  Pt verbalized understanding including recommendation to call [employer] to see if her plan covers membership.    HOME EXERCISE PROGRAM: Access Code: [email or web address] URL: [email or web address] Date: 04/30/2022 Prepared by: Jamey Reas  Exercises - wide stance head motions eyes open  - 1 x daily - 4 x weekly - 1 sets - 10 reps - 2 seconds hold - Feet Apart with Eyes Closed with Head Motions  - 1 x daily - 4 x weekly - 1 sets - 10 reps - 2 seconds hold - stand on foam eyes open   - 1 x daily - 4 x weekly - 1 sets - 10 reps - 2 seconds hold    Do each exercise 5-10 repetitions Hold each exercise for 2 seconds to feel your location  AT [place].  Try to find this  position when standing still for activities.   USE TAPE ON FLOOR TO MARK THE MIDLINE POSITION which is even with middle of sink.  You also should try to feel with your limb pressure in socket.  You are trying to feel with limb what you used to feel with the bottom of your foot.  Side to Side Shift: Moving your hips only (not shoulders): move weight onto your left leg, HOLD/FEEL pressure in socket.  Move back to equal weight on each leg, HOLD/FEEL pressure in socket. Move weight onto your right leg, HOLD/FEEL pressure in socket. Move back to  equal weight on each leg, HOLD/FEEL pressure in socket. Repeat.  Start with both hands on sink, progress to hand on prosthetic side only, then no hands.  Front to Back Shift: Moving your hips only (not shoulders): move your weight forward onto your toes, HOLD/FEEL pressure in socket. Move your weight back to equal Flat Foot on both legs, HOLD/FEEL  pressure in socket. Move your weight back onto your heels, HOLD/FEEL  pressure in socket. Move your weight back to equal on both legs, HOLD/FEEL  pressure in socket. Repeat.  Start with both hands on sink, progress to hand on prosthetic side only, then no hands.  Moving Cones / Cups: With equal weight on each leg: Hold on with one hand the first time, then progress to no hand supports. Move cups from one side of sink to the other. Place cups ~2" out of your reach, progress to 10" beyond reach.  Place one hand in middle of sink and reach with other hand. Do both arms.  Then hover one hand and move cups with other hand.  Overhead/Upward Reaching: alternated reaching up to top cabinets or ceiling if no cabinets present. Keep equal weight on each leg. Start with one hand support on counter while other hand reaches and progress to no hand support with reaching.  ace one hand in middle of sink and reach with other hand. Do both arms.  Then hover one hand and move cups with other hand.  5.   Looking Over Shoulders: With equal weight  on each leg: alternate turning to look over your shoulders with one hand support on counter as needed.  Start with head motions only to look in front of shoulder, then even with shoulder and progress to looking behind you. To look to side, move head /eyes, then shoulder on side looking pulls back, shift more weight to side looking and pull hip back. Place one hand in middle of sink and let go with other hand so your shoulder can pull back. Switch hands to look other way.   Then hover one hand and look over shoulder. If looking right, use left hand at sink. If looking left, use right hand at sink. 6.  Stepping with leg that is not amputated:  Move items under cabinet out of your way. Shift your hips/pelvis so weight on prosthesis. Tighten muscles in hip on prosthetic side.  SLOWLY step other leg so front of foot is in cabinet. Then step back to floor.   Back & standing endurance HEP on 05/12/22 Stand at sink with chair back on your right & left and w/c or locked rollator behind you.  Place back of pelvis against counter with feet under your pelvis.  Rest your hands on counter beside you.  Time how long you can stand upright.  Try to increase time over days & weeks.  Stand facing w/c lean forward placing both hands in seat & stand back up straight, then lean forward hovering hands over seat Facing sink: Rotate upper body to place item on back of each chair on right & left. Side bend placing water bottle in each seat. Reach over head following hands with eyes.     ASSESSMENT:  CLINICAL IMPRESSION: Patient met all STGs for first 30 days.  Her Berg Balance score improved 10 points which is greater than minimal significant difference of 8 points. However <45/56 still indicates fall risk.   Pt continues to benefit from skilled PT.    OBJECTIVE IMPAIRMENTS: Abnormal gait,  decreased activity tolerance, decreased balance, decreased endurance, decreased knowledge of condition, decreased knowledge of use of DME,  decreased mobility, difficulty walking, decreased ROM, decreased strength, increased edema, impaired flexibility, postural dysfunction, prosthetic dependency , obesity, and pain.    ACTIVITY LIMITATIONS: carrying, lifting, bending, standing, stairs, transfers, and locomotion level   PARTICIPATION LIMITATIONS: meal prep, cleaning, laundry, driving, community activity, and church   PERSONAL FACTORS: Fitness, Time since onset of injury/illness/exacerbation, and 3+ comorbidities: see PMH  are also affecting patient's functional outcome.    REHAB POTENTIAL: Good   CLINICAL DECISION MAKING: Evolving/moderate complexity   EVALUATION COMPLEXITY: Moderate     GOALS: Goals reviewed with patient? Yes   SHORT TERM GOALS: Target date: 05/22/2022   Patient donnes prosthesis modified independent & verbalizes proper cleaning. Baseline: SEE OBJECTIVE DATA Goal status: MET  05/22/2022 2.  Patient tolerates prosthesis >12 hrs total /day without skin issues or limb pain <5/10 after standing. Baseline: SEE OBJECTIVE DATA Goal status: MET  05/22/2022   3. Berg Balance >/= 23/56. Baseline: SEE OBJECTIVE DATA Goal status:  MET  05/22/2022   4. Patient ambulates 21' with RW & prosthesis with supervision. Baseline: SEE OBJECTIVE DATA Goal status: MET  05/22/2022   5. Patient negotiates ramps & curbs with RW & prosthesis with supervision. Baseline: SEE OBJECTIVE DATA Goal status:   MET  05/22/2022   LONG TERM GOALS: Target date: 07/17/2022   Patient demonstrates & verbalized understanding of prosthetic care to enable safe utilization of prosthesis. Baseline: SEE OBJECTIVE DATA Goal status: INITIAL   Patient tolerates prosthesis wear >90% of awake hours without skin or limb pain issues. Baseline: SEE OBJECTIVE DATA Goal status: INITIAL   Berg Balance >36/56 to indicate lower fall risk Baseline: SEE OBJECTIVE DATA Goal status: INITIAL   Patient ambulates >300' with prosthesis &  LRADindependently Baseline: SEE OBJECTIVE DATA Goal status: INITIAL   Patient negotiates ramps, curbs with LRAD & stairs with single rail with prosthesis independently. Baseline: SEE OBJECTIVE DATA Goal status: INITIAL   Patient verbalizes & demonstrates understanding of HEP Baseline: SEE OBJECTIVE DATA Goal status: INITIAL   PLAN:   PT FREQUENCY: 2x/week   PT DURATION: 12 weeks   PLANNED INTERVENTIONS: Therapeutic exercises, Therapeutic activity, Neuromuscular re-education, Balance training, Gait training, Patient/Family education, Self Care, Stair training, Vestibular training, Canalith repositioning, Prosthetic training, DME instructions, and physical performance training   PLAN FOR NEXT SESSION: Do 10th visit note, check STGs next week,   continue gait with [facility] for household mobility. Standing balance activities.     Jamey Reas, PT, DPT 05/22/2022, 2:17 PM

## 2022-05-26 ENCOUNTER — Ambulatory Visit (INDEPENDENT_AMBULATORY_CARE_PROVIDER_SITE_OTHER): Payer: Medicare Other | Admitting: Physical Therapy

## 2022-05-26 ENCOUNTER — Encounter: Payer: Self-pay | Admitting: Physical Therapy

## 2022-05-26 DIAGNOSIS — R2689 Other abnormalities of gait and mobility: Secondary | ICD-10-CM | POA: Diagnosis not present

## 2022-05-26 DIAGNOSIS — R2681 Unsteadiness on feet: Secondary | ICD-10-CM | POA: Diagnosis not present

## 2022-05-26 DIAGNOSIS — R42 Dizziness and giddiness: Secondary | ICD-10-CM

## 2022-05-26 DIAGNOSIS — M6281 Muscle weakness (generalized): Secondary | ICD-10-CM | POA: Diagnosis not present

## 2022-05-26 DIAGNOSIS — M79662 Pain in left lower leg: Secondary | ICD-10-CM

## 2022-05-26 NOTE — Therapy (Signed)
OUTPATIENT PHYSICAL THERAPY TREATMENT NOTE & PROGRESS NOTE   Patient Name: Tammy Good MRN: [id number] DOB:01/28/1951, 72 y.o., female Today's Date: 05/26/2022  PCP: general provider REFERRING PROVIDER: Melony Overly, MD   Progress Note Reporting Period 04/21/2022 to 05/26/2022  See note below for Objective Data and Assessment of Progress/Goals.      END OF SESSION:   PT End of Session - 05/26/22 1513     Visit Number 10    Number of Visits 25    Date for PT Re-Evaluation 07/17/22    Authorization Type Medicare & BCBS state health PPO    Progress Note Due on Visit 20    PT Start Time 1514    PT Stop Time 1555    PT Time Calculation (min) 41 min    Equipment Utilized During Treatment Gait belt    Activity Tolerance Patient tolerated treatment well;Patient limited by pain    Behavior During Therapy WFL for tasks assessed/performed                   Past Medical History:  Diagnosis Date   CAD S/P percutaneous coronary angioplasty 08/2013   100% mRCA - PCI Integrity Resolute DES 3.0 mm x 38 mm - 3.35 mm; PTCA of RPA V 2.0 mm x 15 mm   CHF (congestive heart failure) (HCC)    Cholesteatoma    right   Diabetes mellitus type 2 in obese (HCC)    On insulin and Invokana   History of osteomyelitis L 5th Toe all 05/2019   s/p Partial Ray Amputation with partial closure; 6 wks Abx & LSFA Atherectomy/DEB PTA with Stent for focal dissection.   Hyperlipidemia with target LDL less than 70    Hypothyroidism (acquired)    Mild essential hypertension    Obesity (BMI 30-39.9) 11/17/2013   PAD (peripheral artery disease) ([place]) 05/26/2019   05/26/19: Abd AoGram- BLE runoff -> L SFA orbital atherectomy - PTA w/ DEB & Stent 6 x 40 Luttonix (for focal dissection) - patent Pop A with 3 V runoff. LEA Dopplers 01/03/2020: RABI (prev) 0.68 (0.53)/ RTBI (prev) 0.58 (0.33); LABI (prev) 0.80 (0.64), LTBI (prev) 0.64 (0.51); R mSFA ~50-74%, L mSFA 50-74%. Patent Prox SFA stent < 49%  stenosis   Panhypopituitarism (HCC)    ST elevation myocardial infarction (STEMI) of inferior wall, subsequent episode of care ([place]) 08/2013   80% branch of D1, 40% mid AV groove circumflex, 100% RCA with subacute thrombus -- thrombus extending into RPA V with 100% occlusion after initial angioplasty of mid RCA ;; Post MI ECHO 6/9/'15: EF 50-55%, mild LVH with moderate HK of inferior wall, Gr1 DD, mild LA dilation; mildly reduced RV function   Past Surgical History:  Procedure Laterality Date   ABDOMINAL AORTOGRAM W/LOWER EXTREMITY N/A 05/26/2019   Procedure: ABDOMINAL AORTOGRAM W/LOWER EXTREMITY;  Surgeon: Marty Heck, MD;  Location: [place] [facility];  Service: Cardiovascular;  Laterality: N/A;   ABDOMINAL AORTOGRAM W/LOWER EXTREMITY N/A 11/15/2020   Procedure: ABDOMINAL AORTOGRAM W/LOWER EXTREMITY;  Surgeon: Marty Heck, MD;  Location: [place] [facility];  Service: Cardiovascular;  Laterality: N/A;   ABDOMINAL AORTOGRAM W/LOWER EXTREMITY N/A 04/18/2021   Procedure: ABDOMINAL AORTOGRAM W/LOWER EXTREMITY;  Surgeon: Marty Heck, MD;  Location: [place] [facility] [facility];  Service: Cardiovascular;  Laterality: N/A;   ABDOMINAL AORTOGRAM W/LOWER EXTREMITY Left 07/11/2021   Procedure: ABDOMINAL AORTOGRAM W/LOWER EXTREMITY;  Surgeon: Marty Heck, MD;  Location: [place] [facility];  Service:  Cardiovascular;  Laterality: Left;   AMPUTATION Left 05/25/2019   Procedure: AMPUTATION RAY 5th;  Surgeon: Trula Slade, DPM;  Location: [place];  Service: Podiatry;  Laterality: Left;   AMPUTATION Left 01/14/2022   Procedure: LEFT BELOW THE KNEE AMPUTATION;  Surgeon: Erle Crocker, MD;  Location: [place];  Service: Orthopedics;  Laterality: Left;  LENGTH OF SURGERY: 90 MINUTES   APPLICATION OF WOUND VAC Left 01/14/2022   Procedure: WOUND VAC PLACEMENT;  Surgeon: Erle Crocker, MD;  Location: [place];  Service: Orthopedics;  Laterality: Left;   BONE BIOPSY Left  04/24/2021   Procedure: BONE BIOPSY;  Surgeon: Landis Martins, DPM;  Location: [place];  Service: Podiatry;  Laterality: Left;  left ankle/fibula   BONE BIOPSY Left 07/15/2021   Procedure: LEFT FOOT BONE BIOPSY;  Surgeon: Trula Slade, DPM;  Location: [place];  Service: Podiatry;  Laterality: Left;   Cardiac Event Monitor  July-August 2015   Sinus rhythm with PVCs   CHOLECYSTECTOMY     COLONOSCOPY N/A 08/31/2013   Procedure: COLONOSCOPY;  Surgeon: Juanita Craver, MD;  Location: [facility] ENDOSCOPY;  Service: Endoscopy;  Laterality: N/A;   CORONARY STENT INTERVENTION N/A 11/19/2020   Procedure: CORONARY STENT INTERVENTION;  Surgeon: Nelva Bush, MD;  Location: [facility];  Service: Cardiovascular;  Laterality: N/A;   ESOPHAGOGASTRODUODENOSCOPY N/A 09/01/2013   Procedure: ESOPHAGOGASTRODUODENOSCOPY (EGD);  Surgeon: Beryle Beams, MD;  Location: [facility] ENDOSCOPY;  Service: Endoscopy;  Laterality: N/A;  bedside   ESOPHAGOGASTRODUODENOSCOPY (EGD) WITH PROPOFOL N/A 05/26/2021   Procedure: ESOPHAGOGASTRODUODENOSCOPY (EGD) WITH PROPOFOL;  Surgeon: Carol Ada, MD;  Location: [place];  Service: Gastroenterology;  Laterality: N/A;   EYE SURGERY Bilateral    bilateral cataracts   INCISION AND DRAINAGE Left 04/24/2021   Procedure: INCISION AND DRAINAGE;  Surgeon: Landis Martins, DPM;  Location: [place];  Service: Podiatry;  Laterality: Left;   IRRIGATION AND DEBRIDEMENT FOOT Left 07/15/2021   Procedure: IRRIGATION AND DEBRIDEMENT FOOT;  Surgeon: Trula Slade, DPM;  Location: [place];  Service: Podiatry;  Laterality: Left;   LEFT HEART CATH AND CORONARY ANGIOGRAPHY N/A 11/19/2020   Procedure: LEFT HEART CATH AND CORONARY ANGIOGRAPHY;  Surgeon: Nelva Bush, MD;  Location: [place] [facility];  Service: Cardiovascular;  Laterality: N/A;   LEFT HEART CATHETERIZATION WITH CORONARY ANGIOGRAM N/A 08/30/2013   Procedure: LEFT HEART CATHETERIZATION WITH CORONARY ANGIOGRAM;  Surgeon: Leonie Man, MD;  Location: [facility] CATH LAB: 100% mRCA (thrombus - extends to RPAV), 80% D1, 40% AVG Cx.   PERCUTANEOUS CORONARY STENT INTERVENTION (PCI-S)  08/30/2013   Procedure: PERCUTANEOUS CORONARY STENT INTERVENTION (PCI-S);  Surgeon: Leonie Man, MD;  Location: [facility] CATH LAB;  Integrity Resolute DES 2.0 mm x 38 mm -- 3.35 mm.; PTCA of proximal RPA V. - 3.0 mm x 15 mm balloon   PERIPHERAL VASCULAR ATHERECTOMY  05/26/2019   Procedure: PERIPHERAL VASCULAR ATHERECTOMY;  Surgeon: Marty Heck, MD;  Location: [place] [facility];  Service: Cardiovascular;;  Left SFA   PERIPHERAL VASCULAR BALLOON ANGIOPLASTY Left 07/11/2021   Procedure: PERIPHERAL VASCULAR BALLOON ANGIOPLASTY;  Surgeon: Marty Heck, MD;  Location: [place] [facility];  Service: Cardiovascular;  Laterality: Left;  PT TRUNK / AT   PERIPHERAL VASCULAR INTERVENTION  05/26/2019   Procedure: PERIPHERAL VASCULAR INTERVENTION;  Surgeon: Marty Heck, MD;  Location: [place] [facility];  Service: Cardiovascular;;  Left SFA   PERIPHERAL VASCULAR INTERVENTION Right 11/15/2020   Procedure: PERIPHERAL VASCULAR INTERVENTION;  Surgeon: Marty Heck, MD;  Location: [place] [facility];  Service: Cardiovascular;  Laterality: Right;  Superficial Femoral Artery   PERIPHERAL VASCULAR INTERVENTION Left 07/11/2021   Procedure: PERIPHERAL VASCULAR INTERVENTION;  Surgeon: Marty Heck, MD;  Location: [place] [facility];  Service: Cardiovascular;  Laterality: Left;  SFA   PITUITARY SURGERY     TEE WITHOUT CARDIOVERSION N/A 04/26/2021   Procedure: TRANSESOPHAGEAL ECHOCARDIOGRAM (TEE);  Surgeon: Buford Dresser, MD;  Location: [facility] ENDOSCOPY;  Service: Cardiovascular;  Laterality: N/A;   TRANSTHORACIC ECHOCARDIOGRAM  08/30/2013   mild LVH. EF 50-55%. Moderate HK of the entire inferior myocardium. GR 1 DD. Mild LA dilation. Mildly reduced RV function   TYMPANOMASTOIDECTOMY Right 12/28/2017   Procedure: RIGHT  TYMPANOMASTOIDECTOMY;  Surgeon: Leta Baptist, MD;  Location: [place];  Service: ENT;  Laterality: Right;   Patient Active Problem List   Diagnosis Date Noted   Nocturia more than twice per night 02/03/2022   Unilateral complete BKA, left, initial encounter ([place]) 01/17/2022   Osteomyelitis ([place]) 01/13/2022   Normocytic anemia 01/13/2022   Atrial fibrillation ([place]) 08/15/2021   Septic arthritis of foot ([place]) 08/02/2021   Medication monitoring encounter 08/02/2021   Acute on chronic systolic CHF (congestive heart failure) ([place]) 06/02/2021   Hypothyroidism 06/02/2021   Ulcer of esophagus without bleeding    Pressure injury of skin 05/31/2021   Hypokalemia 05/30/2021   Symptomatic anemia 05/29/2021   AKI (acute kidney injury) ([place]) 05/29/2021   GI bleed 05/29/2021   Liver lesion 05/29/2021   Vertigo 05/24/2021   Generalized weakness 05/24/2021   Tenosynovitis of left ankle 04/25/2021   Thrombocytosis 04/23/2021   Fatty liver 06/19/2020   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 ([place]) [id number]   Atherosclerotic heart disease of native coronary artery without angina pectoris 03/20/2020   Epigastric pain 03/20/2020   Nausea 03/20/2020   Absence of toe ([place]) 06/28/2019   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   PAD (peripheral artery disease) ([place]) 05/26/2019   Osteomyelitis of left foot ([place]) 05/24/2019   Cellulitis and abscess of foot, except toes 05/18/2019   Chronic osteomyelitis of ankle and foot ([place]) 05/18/2019   Cholesteatoma 02/24/2019   Cellulitis and abscess of toe 11/09/2018   Colon cancer screening 11/30/2017   Long term (current) use of insulin ([place]) 03/05/2017   Iron deficiency anemia 12/10/2014   Obesity (BMI 30-39.9) 11/17/2013   Diabetes mellitus, type 2 ([place])    Essential hypertension    Right thigh pain 11/01/2013   PVC's (premature ventricular contractions) 11/01/2013   Status post insertion of drug eluting coronary  artery stent to [facility] emergently and PTCA to prox. PLA 09/16/2013   Hyperlipidemia associated with type 2 diabetes mellitus ([place]) 09/16/2013   Presence of coronary angioplasty implant and graft 09/08/2013   Panhypopituitarism ([place]) 08/30/2013   Non-ST elevation (NSTEMI) myocardial infarction ([place]) 08/22/2013   CAD S/P percutaneous coronary angioplasty 08/22/2013    REFERRING DIAG: [id number] (ICD-10-CM) - Hx of BKA, left    ONSET DATE:  03/19/2022 prosthesis delivery   THERAPY DIAG:  Other abnormalities of gait and mobility  Unsteadiness on feet  Pain in left lower leg  Muscle weakness (generalized)  Dizziness and giddiness  Rationale for Evaluation and Treatment Rehabilitation  PERTINENT HISTORY: CAD, STEMI, CHF, PAD, A-Fib, IDDM2, severe septic arthritis   PRECAUTIONS: Fall  SUBJECTIVE:  SUBJECTIVE STATEMENT:  She got into shower using prosthesis to get into area which helped. She stood to make a cake.    PAIN:  Are you having pain? Yes: NPRS scale:  today 0/10 and in last week 0/10 Pain location: left distal tibia, posterior knee & distal limb.  Pain description: sore Aggravating factors: walking & standing.  Relieving factors: taking prosthesis off   Back pain lumbar: 0/10 today & in last week 4/10 walking or standing but goes away with sitting.            OBJECTIVE: (objective measures completed at initial evaluation unless otherwise dated)  COGNITION: 04/21/2022:  Overall cognitive status: Within functional limits for tasks assessed   POSTURE: 04/21/2022:  rounded shoulders, forward head, flexed trunk , and weight shift right   LOWER EXTREMITY ROM:   ROM P:passive  A:active Left Eval 04/21/22:  Hip flexion WFL  Hip extension WFL  Hip abduction WFL  Hip adduction    Hip internal  rotation    Hip external rotation    Knee flexion 90*  Knee extension -5*  Ankle dorsiflexion NA  Ankle plantarflexion NA  Ankle inversion NA  Ankle eversion NA   (Blank rows = not tested)   LOWER EXTREMITY MMT:   MMT Left Eval 04/21/22  Hip flexion 4/5  Hip extension 3+/5  Hip abduction 3+/5  Hip adduction    Hip internal rotation    Hip external rotation    Knee flexion 3+/5  Knee extension 4-/5  Ankle dorsiflexion NA  Ankle plantarflexion NA  Ankle inversion NA  Ankle eversion NA  (Blank rows = not tested)   TRANSFERS: 05/26/2022:  sit to/from stand pushing off seat of chair (no armrests needed) and stabilizes without UE support.   04/21/2022:  Sit to stand: SBA requires armrests & BUEs to push up but able to stabilize without touching RW 04/21/2022:  Stand to sit: SBA requires armrests & BUEs to control descent.    GAIT: 05/26/2022: pt amb 23' with SBQC with minA.  Pt amb >300' with RW or rollator walker safely.   04/21/2022:  Gait pattern: step to pattern, decreased step length- Right, decreased stance time- Left, decreased hip/knee flexion- Left, circumduction- Left, Left hip hike, knee flexed in stance- Left, antalgic, lateral hip instability, trunk flexed, and abducted- Left Distance walked: 42' Assistive device utilized: Environmental consultant - 2 wheeled and TTA prosthesis Level of assistance: SBA  verbal cues only with RW Comments: excessive UE weight bearing on RW   FUNCTIONAL TESTs:  05/22/2022:  Merrilee Jansky Balance 29/56   04/28/2022:  PT assessed vertigo:  Brayton Caves  - left cervical rotation sit to supine no nystagmus, right rotation no dizziness and rolling right. -right cervical rotation sit to supine no nystagmus, left rotation slight dizziness and rolling left slight dizziness. Nestor Lewandowsky To right light dizziness sit to supine & severe dizziness sitting up. To left light dizziness sit to supine & severe dizziness sitting up.  Orthostatic Hypotension Supine no  dizziness  145/70 HR 80  Standing light dizziness 119/67 HR 108 After standing 3 min  no dizziness BP 158/76 HR 112 Positive for orthostatic Hypotension   04/21/2022: Dizziness with head motions.  Berg Balance Scale: 19/56    CURRENT PROSTHETIC WEAR ASSESSMENT:  04/21/2022: Patient is independent with: prosthetic cleaning Patient is dependent with: skin check, residual limb care, correct ply sock adjustment, proper wear schedule/adjustment, and proper weight-bearing schedule/adjustment Donning prosthesis: SBA Doffing prosthesis: Modified independence Prosthetic wear tolerance: ~14  hours total between 2x/day, 7 days/week Prosthetic weight bearing tolerance: 5 minutes with partial weight on prosthesis with limb pain 8/10 Edema: pitting Residual limb condition: no open areas but one small area distal tibia that may be internal suture working out,  shiny skin with no hair growth, redness over patella & distal tibia, no temperature issues noted. Cylindrical shape Prosthetic description: silicon liner with pin lock suspension, total contact socket with flexible inner socket, dynamic response foot/ankle       TODAY'S TREATMENT:                                                                                                                             DATE:  05/26/2022: Prosthetic Training with Transtibial Prosthesis: Pt amb 50' X 4 with SBQC working on upright posture, carrying open water and distraction with CGA. PT demo and verbal cues on stepping over obstacle with prosthesis and SBQC.  Patient performed over 6 inch hurdle x 4 reps with minA.  PT demoed stepping over obstacles with counter near left UE.  Patient performed 4 reps and verbalizes understanding of how to perform safely at home for practice. Patient able to pick up items off floor with supervision and verbal cues. PT demo standing upright posture activity with posterior pelvis to countertop.  Patient verbalized and return  demonstration understanding   05/22/2022: Prosthetic Training with Transtibial Prosthesis: PT recommended wearing prosthesis during all awake hours including when feeling sick.  Patient's fall risk is higher and stress on other lower extremity is higher when prosthesis is off.  When she is feeling sick she may not be as active but she needs the prosthesis available for mobility.  Patient verbalized understanding. Patient ambulated 25 feet x 8 with SBQC around cones for negotiation including 180* turns with min guard.  PT progressed to carrying a bottle of water initially close then open.   PT demo and verbal cues on using rolling walker to remove items from 1 area to another.  Patient verbalized understanding. PT demo picking up items off floor.  Patient able to return demonstration and verbalized understanding including set up to practice at home safely.   Self-Care: PT verbally educated pt on signs of stroke and need to immediately seek medical attention if suspects. Pt verbalized understanding.   05/14/2022: Prosthetic Training with Transtibial Prosthesis: Pt amb 75' X 2 with SBQC & HHA minA. Pt amb 75' X 2 with cane stand alone tip & HHA minA. More pressure thru PT hand, less LE stance stability & clearance with cane stand alone tip. PT demo & verbal cues on difference bw LBQC, SBQC & cane stand alone tip. PT goal is household gait with cane to enable free hand to carry items.   Pt amb near counter hovering LUE over counter with RUE SBQC with supervison. Walking backwards with LUE touching counter & SBQC with supervision. PT verbally educated pt on fall risk when prosthesis is off. Pt verbalized understanding.  HOME EXERCISE PROGRAM: Access Code: [email or web address] URL: [email or web address] Date: 04/30/2022 Prepared by: Jamey Reas  Exercises - wide stance head motions eyes open  - 1 x daily - 4 x weekly - 1 sets - 10 reps - 2 seconds hold - Feet Apart with Eyes Closed with  Head Motions  - 1 x daily - 4 x weekly - 1 sets - 10 reps - 2 seconds hold - stand on foam eyes open   - 1 x daily - 4 x weekly - 1 sets - 10 reps - 2 seconds hold    Do each exercise 5-10 repetitions Hold each exercise for 2 seconds to feel your location  AT [place].  Try to find this position when standing still for activities.   USE TAPE ON FLOOR TO MARK THE MIDLINE POSITION which is even with middle of sink.  You also should try to feel with your limb pressure in socket.  You are trying to feel with limb what you used to feel with the bottom of your foot.  Side to Side Shift: Moving your hips only (not shoulders): move weight onto your left leg, HOLD/FEEL pressure in socket.  Move back to equal weight on each leg, HOLD/FEEL pressure in socket. Move weight onto your right leg, HOLD/FEEL pressure in socket. Move back to equal weight on each leg, HOLD/FEEL pressure in socket. Repeat.  Start with both hands on sink, progress to hand on prosthetic side only, then no hands.  Front to Back Shift: Moving your hips only (not shoulders): move your weight forward onto your toes, HOLD/FEEL pressure in socket. Move your weight back to equal Flat Foot on both legs, HOLD/FEEL  pressure in socket. Move your weight back onto your heels, HOLD/FEEL  pressure in socket. Move your weight back to equal on both legs, HOLD/FEEL  pressure in socket. Repeat.  Start with both hands on sink, progress to hand on prosthetic side only, then no hands.  Moving Cones / Cups: With equal weight on each leg: Hold on with one hand the first time, then progress to no hand supports. Move cups from one side of sink to the other. Place cups ~2" out of your reach, progress to 10" beyond reach.  Place one hand in middle of sink and reach with other hand. Do both arms.  Then hover one hand and move cups with other hand.  Overhead/Upward Reaching: alternated reaching up  to top cabinets or ceiling if no cabinets present. Keep equal weight on each leg. Start with one hand support on counter while other hand reaches and progress to no hand support with reaching.  ace one hand in middle of sink and reach with other hand. Do both arms.  Then hover one hand and move cups with other hand.  5.   Looking Over Shoulders: With equal weight on each leg: alternate turning to look over your shoulders with one hand support on counter as needed.  Start with head motions only to look in front of shoulder, then even with shoulder and progress to looking behind you. To look to side, move head /eyes, then shoulder on side looking pulls back, shift more weight to side looking and pull hip back. Place one hand in middle of sink and let go with other hand so your shoulder can pull back. Switch hands to look other way.   Then hover one hand and look over shoulder. If looking  right, use left hand at sink. If looking left, use right hand at sink. 6.  Stepping with leg that is not amputated:  Move items under cabinet out of your way. Shift your hips/pelvis so weight on prosthesis. Tighten muscles in hip on prosthetic side.  SLOWLY step other leg so front of foot is in cabinet. Then step back to floor.   Back & standing endurance HEP on 05/12/22 Stand at sink with chair back on your right & left and w/c or locked rollator behind you.  Place back of pelvis against counter with feet under your pelvis.  Rest your hands on counter beside you.  Time how long you can stand upright.  Try to increase time over days & weeks.  Stand facing w/c lean forward placing both hands in seat & stand back up straight, then lean forward hovering hands over seat Facing sink: Rotate upper body to place item on back of each chair on right & left. Side bend placing water bottle in each seat. Reach over head following hands with eyes.     ASSESSMENT:  CLINICAL IMPRESSION: Pt. has attended 10 visits since evaluations.   See objective data for updated information.  Pt. has made gains towards improving her mobility with prosthesis for community accessibility without w/c.  She is making gains for household mobility with a cane enabling her freehand to carry items pt. may continue to benefit from skilled PT services to continue progression towards reaching established goals and reduced risk of falls due to presentation. Patient met all STGs for first 30 days.  Her Berg Balance score improved 10 points which is greater than minimal significant difference of 8 points. However <45/56 still indicates fall risk.   OBJECTIVE IMPAIRMENTS: Abnormal gait, decreased activity tolerance, decreased balance, decreased endurance, decreased knowledge of condition, decreased knowledge of use of DME, decreased mobility, difficulty walking, decreased ROM, decreased strength, increased edema, impaired flexibility, postural dysfunction, prosthetic dependency , obesity, and pain.    ACTIVITY LIMITATIONS: carrying, lifting, bending, standing, stairs, transfers, and locomotion level   PARTICIPATION LIMITATIONS: meal prep, cleaning, laundry, driving, community activity, and church   PERSONAL FACTORS: Fitness, Time since onset of injury/illness/exacerbation, and 3+ comorbidities: see PMH  are also affecting patient's functional outcome.    REHAB POTENTIAL: Good   CLINICAL DECISION MAKING: Evolving/moderate complexity   EVALUATION COMPLEXITY: Moderate     GOALS: Goals reviewed with patient? Yes   SHORT TERM GOALS: Target date: 05/22/2022   Patient donnes prosthesis modified independent & verbalizes proper cleaning. Baseline: SEE OBJECTIVE DATA Goal status: MET  05/22/2022 2.  Patient tolerates prosthesis >12 hrs total /day without skin issues or limb pain <5/10 after standing. Baseline: SEE OBJECTIVE DATA Goal status: MET  05/22/2022   3. Berg Balance >/= 23/56. Baseline: SEE OBJECTIVE DATA Goal status:  MET  05/22/2022   4. Patient  ambulates 78' with RW & prosthesis with supervision. Baseline: SEE OBJECTIVE DATA Goal status: MET  05/22/2022   5. Patient negotiates ramps & curbs with RW & prosthesis with supervision. Baseline: SEE OBJECTIVE DATA Goal status:   MET  05/22/2022   LONG TERM GOALS: Target date: 07/17/2022   Patient demonstrates & verbalized understanding of prosthetic care to enable safe utilization of prosthesis. Baseline: SEE OBJECTIVE DATA Goal status: INITIAL   Patient tolerates prosthesis wear >90% of awake hours without skin or limb pain issues. Baseline: SEE OBJECTIVE DATA Goal status: INITIAL   Berg Balance >36/56 to indicate lower fall risk Baseline: SEE OBJECTIVE  DATA Goal status: INITIAL   Patient ambulates >300' with prosthesis & LRADindependently Baseline: SEE OBJECTIVE DATA Goal status: INITIAL   Patient negotiates ramps, curbs with LRAD & stairs with single rail with prosthesis independently. Baseline: SEE OBJECTIVE DATA Goal status: INITIAL   Patient verbalizes & demonstrates understanding of HEP Baseline: SEE OBJECTIVE DATA Goal status: INITIAL   PLAN:   PT FREQUENCY: 2x/week   PT DURATION: 12 weeks   PLANNED INTERVENTIONS: Therapeutic exercises, Therapeutic activity, Neuromuscular re-education, Balance training, Gait training, Patient/Family education, Self Care, Stair training, Vestibular training, Canalith repositioning, Prosthetic training, DME instructions, and physical performance training   PLAN FOR NEXT SESSION: set updated STGs,   continue gait with SBQC for household mobility. Standing balance activities.     Jamey Reas, PT, DPT 05/26/2022, 4:47 PM

## 2022-05-28 ENCOUNTER — Encounter: Payer: Self-pay | Admitting: Physical Therapy

## 2022-05-28 ENCOUNTER — Ambulatory Visit (INDEPENDENT_AMBULATORY_CARE_PROVIDER_SITE_OTHER): Payer: Medicare Other | Admitting: Physical Therapy

## 2022-05-28 DIAGNOSIS — M6281 Muscle weakness (generalized): Secondary | ICD-10-CM

## 2022-05-28 DIAGNOSIS — R2689 Other abnormalities of gait and mobility: Secondary | ICD-10-CM | POA: Diagnosis not present

## 2022-05-28 DIAGNOSIS — R42 Dizziness and giddiness: Secondary | ICD-10-CM

## 2022-05-28 DIAGNOSIS — R2681 Unsteadiness on feet: Secondary | ICD-10-CM

## 2022-05-28 DIAGNOSIS — M79662 Pain in left lower leg: Secondary | ICD-10-CM | POA: Diagnosis not present

## 2022-05-28 NOTE — Therapy (Signed)
OUTPATIENT PHYSICAL THERAPY TREATMENT NOTE  Patient Name: Tammy Good MRN: [id number] DOB:10/30/1950, 72 y.o., female Today's Date: 05/28/2022  PCP: general provider REFERRING PROVIDER: Melony Overly, MD    END OF SESSION:   PT End of Session - 05/28/22 1516     Visit Number 11    Number of Visits 25    Date for PT Re-Evaluation 07/17/22    Authorization Type Medicare & BCBS state health PPO    Progress Note Due on Visit 20    PT Start Time 1516    PT Stop Time 1555    PT Time Calculation (min) 39 min    Equipment Utilized During Treatment Gait belt    Activity Tolerance Patient tolerated treatment well;Patient limited by pain    Behavior During Therapy WFL for tasks assessed/performed                    Past Medical History:  Diagnosis Date   CAD S/P percutaneous coronary angioplasty 08/2013   100% mRCA - PCI Integrity Resolute DES 3.0 mm x 38 mm - 3.35 mm; PTCA of RPA V 2.0 mm x 15 mm   CHF (congestive heart failure) ([place])    Cholesteatoma    right   Diabetes mellitus type 2 in obese (HCC)    On insulin and Invokana   History of osteomyelitis L 5th Toe all 05/2019   s/p Partial Ray Amputation with partial closure; 6 wks Abx & LSFA Atherectomy/DEB PTA with Stent for focal dissection.   Hyperlipidemia with target LDL less than 70    Hypothyroidism (acquired)    Mild essential hypertension    Obesity (BMI 30-39.9) 11/17/2013   PAD (peripheral artery disease) ([place]) 05/26/2019   05/26/19: Abd AoGram- BLE runoff -> L SFA orbital atherectomy - PTA w/ DEB & Stent 6 x 40 Luttonix (for focal dissection) - patent Pop A with 3 V runoff. LEA Dopplers 01/03/2020: RABI (prev) 0.68 (0.53)/ RTBI (prev) 0.58 (0.33); LABI (prev) 0.80 (0.64), LTBI (prev) 0.64 (0.51); R mSFA ~50-74%, L mSFA 50-74%. Patent Prox SFA stent < 49% stenosis   Panhypopituitarism (HCC)    ST elevation myocardial infarction (STEMI) of inferior wall, subsequent episode of care ([place]) 08/2013   80%  branch of D1, 40% mid AV groove circumflex, 100% RCA with subacute thrombus -- thrombus extending into RPA V with 100% occlusion after initial angioplasty of mid RCA ;; Post MI ECHO 6/9/'15: EF 50-55%, mild LVH with moderate HK of inferior wall, Gr1 DD, mild LA dilation; mildly reduced RV function   Past Surgical History:  Procedure Laterality Date   ABDOMINAL AORTOGRAM W/LOWER EXTREMITY N/A 05/26/2019   Procedure: ABDOMINAL AORTOGRAM W/LOWER EXTREMITY;  Surgeon: Marty Heck, MD;  Location: [place] [facility];  Service: Cardiovascular;  Laterality: N/A;   ABDOMINAL AORTOGRAM W/LOWER EXTREMITY N/A 11/15/2020   Procedure: ABDOMINAL AORTOGRAM W/LOWER EXTREMITY;  Surgeon: Marty Heck, MD;  Location: [place] [facility];  Service: Cardiovascular;  Laterality: N/A;   ABDOMINAL AORTOGRAM W/LOWER EXTREMITY N/A 04/18/2021   Procedure: ABDOMINAL AORTOGRAM W/LOWER EXTREMITY;  Surgeon: Marty Heck, MD;  Location: [place] [facility];  Service: Cardiovascular;  Laterality: N/A;   ABDOMINAL AORTOGRAM W/LOWER EXTREMITY Left 07/11/2021   Procedure: ABDOMINAL AORTOGRAM W/LOWER EXTREMITY;  Surgeon: Marty Heck, MD;  Location: [place] [facility];  Service: Cardiovascular;  Laterality: Left;   AMPUTATION Left 05/25/2019   Procedure: AMPUTATION RAY 5th;  Surgeon: Trula Slade, DPM;  Location: [place];  Service: Podiatry;  Laterality: Left;   AMPUTATION Left 01/14/2022   Procedure: LEFT BELOW THE KNEE AMPUTATION;  Surgeon: Erle Crocker, MD;  Location: [place];  Service: Orthopedics;  Laterality: Left;  LENGTH OF SURGERY: 90 MINUTES   APPLICATION OF WOUND VAC Left 01/14/2022   Procedure: WOUND VAC PLACEMENT;  Surgeon: Erle Crocker, MD;  Location: [place];  Service: Orthopedics;  Laterality: Left;   BONE BIOPSY Left 04/24/2021   Procedure: BONE BIOPSY;  Surgeon: Landis Martins, DPM;  Location: [place];  Service: Podiatry;  Laterality: Left;  left ankle/fibula   BONE  BIOPSY Left 07/15/2021   Procedure: LEFT FOOT BONE BIOPSY;  Surgeon: Trula Slade, DPM;  Location: [place];  Service: Podiatry;  Laterality: Left;   Cardiac Event Monitor  July-August 2015   Sinus rhythm with PVCs   CHOLECYSTECTOMY     COLONOSCOPY N/A 08/31/2013   Procedure: COLONOSCOPY;  Surgeon: Juanita Craver, MD;  Location: [facility] ENDOSCOPY;  Service: Endoscopy;  Laterality: N/A;   CORONARY STENT INTERVENTION N/A 11/19/2020   Procedure: CORONARY STENT INTERVENTION;  Surgeon: Nelva Bush, MD;  Location: [place] [facility];  Service: Cardiovascular;  Laterality: N/A;   ESOPHAGOGASTRODUODENOSCOPY N/A 09/01/2013   Procedure: ESOPHAGOGASTRODUODENOSCOPY (EGD);  Surgeon: Beryle Beams, MD;  Location: [facility] ENDOSCOPY;  Service: Endoscopy;  Laterality: N/A;  bedside   ESOPHAGOGASTRODUODENOSCOPY (EGD) WITH PROPOFOL N/A 05/26/2021   Procedure: ESOPHAGOGASTRODUODENOSCOPY (EGD) WITH PROPOFOL;  Surgeon: Carol Ada, MD;  Location: [place];  Service: Gastroenterology;  Laterality: N/A;   EYE SURGERY Bilateral    bilateral cataracts   INCISION AND DRAINAGE Left 04/24/2021   Procedure: INCISION AND DRAINAGE;  Surgeon: Landis Martins, DPM;  Location: [place];  Service: Podiatry;  Laterality: Left;   IRRIGATION AND DEBRIDEMENT FOOT Left 07/15/2021   Procedure: IRRIGATION AND DEBRIDEMENT FOOT;  Surgeon: Trula Slade, DPM;  Location: [place];  Service: Podiatry;  Laterality: Left;   LEFT HEART CATH AND CORONARY ANGIOGRAPHY N/A 11/19/2020   Procedure: LEFT HEART CATH AND CORONARY ANGIOGRAPHY;  Surgeon: Nelva Bush, MD;  Location: [place] [facility];  Service: Cardiovascular;  Laterality: N/A;   LEFT HEART CATHETERIZATION WITH CORONARY ANGIOGRAM N/A 08/30/2013   Procedure: LEFT HEART CATHETERIZATION WITH CORONARY ANGIOGRAM;  Surgeon: Leonie Man, MD;  Location: [facility] CATH LAB: 100% mRCA (thrombus - extends to RPAV), 80% D1, 40% AVG Cx.   PERCUTANEOUS CORONARY STENT INTERVENTION (PCI-S)   08/30/2013   Procedure: PERCUTANEOUS CORONARY STENT INTERVENTION (PCI-S);  Surgeon: Leonie Man, MD;  Location: [facility] CATH LAB;  Integrity Resolute DES 2.0 mm x 38 mm -- 3.35 mm.; PTCA of proximal RPA V. - 3.0 mm x 15 mm balloon   PERIPHERAL VASCULAR ATHERECTOMY  05/26/2019   Procedure: PERIPHERAL VASCULAR ATHERECTOMY;  Surgeon: Marty Heck, MD;  Location: [place] [facility];  Service: Cardiovascular;;  Left SFA   PERIPHERAL VASCULAR BALLOON ANGIOPLASTY Left 07/11/2021   Procedure: PERIPHERAL VASCULAR BALLOON ANGIOPLASTY;  Surgeon: Marty Heck, MD;  Location: [place] [facility];  Service: Cardiovascular;  Laterality: Left;  PT TRUNK / AT   PERIPHERAL VASCULAR INTERVENTION  05/26/2019   Procedure: PERIPHERAL VASCULAR INTERVENTION;  Surgeon: Marty Heck, MD;  Location: [place] [facility];  Service: Cardiovascular;;  Left SFA   PERIPHERAL VASCULAR INTERVENTION Right 11/15/2020   Procedure: PERIPHERAL VASCULAR INTERVENTION;  Surgeon: Marty Heck, MD;  Location: [place] [facility];  Service: Cardiovascular;  Laterality: Right;  Superficial Femoral Artery   PERIPHERAL VASCULAR INTERVENTION  Left 07/11/2021   Procedure: PERIPHERAL VASCULAR INTERVENTION;  Surgeon: Marty Heck, MD;  Location: [place] [facility];  Service: Cardiovascular;  Laterality: Left;  SFA   PITUITARY SURGERY     TEE WITHOUT CARDIOVERSION N/A 04/26/2021   Procedure: TRANSESOPHAGEAL ECHOCARDIOGRAM (TEE);  Surgeon: Buford Dresser, MD;  Location: [facility] END[facility]COPY;  Service: Cardiovascular;  Laterality: N/A;   TRANSTHORACIC ECHOCARDIOGRAM  08/30/2013   mild LVH. EF 50-55%. Moderate HK of the entire inferior myocardium. GR 1 DD. Mild LA dilation. Mildly reduced RV function   TYMPANOMASTOIDECTOMY Right 12/28/2017   Procedure: RIGHT TYMPANOMASTOIDECTOMY;  Surgeon: Leta Baptist, MD;  Location: [place];  Service: ENT;  Laterality: Right;   Patient Active Problem List   Diagnosis  Date Noted   Nocturia more than twice per night 02/03/2022   Unilateral complete BKA, left, initial encounter ([place]) 01/17/2022   Osteomyelitis ([place]) 01/13/2022   Normocytic anemia 01/13/2022   Atrial fibrillation ([place]) 08/15/2021   Septic arthritis of foot ([place]) 08/02/2021   Medication monitoring encounter 08/02/2021   Acute on chronic systolic CHF (congestive heart failure) ([place]) 06/02/2021   Hypothyroidism 06/02/2021   Ulcer of esophagus without bleeding    Pressure injury of skin 05/31/2021   Hypokalemia 05/30/2021   Symptomatic anemia 05/29/2021   AKI (acute kidney injury) (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   PAD (peripheral artery disease) ([place]) 05/26/2019   Osteomyelitis of left foot ([place]) 05/24/2019   Cellulitis and abscess of foot, except toes 05/18/2019   Chronic osteomyelitis of ankle and foot ([place]) 05/18/2019   Cholesteatoma 02/24/2019   Cellulitis and abscess of toe 11/09/2018   Colon cancer screening 11/30/2017   Long term (current) use of insulin ([place]) 03/05/2017   Iron deficiency anemia 12/10/2014   Obesity (BMI 30-39.9) 11/17/2013   Diabetes mellitus, type 2 ([place])    Essential hypertension    Right thigh pain 11/01/2013   PVC's (premature ventricular contractions) 11/01/2013   Status post insertion of drug eluting coronary artery stent to [facility] emergently and PTCA to prox. PLA 09/16/2013   Hyperlipidemia associated with type 2 diabetes mellitus ([place]) 09/16/2013   Presence of  coronary angioplasty implant and graft 09/08/2013   Panhypopituitarism ([place]) 08/30/2013   Non-ST elevation (NSTEMI) myocardial infarction ([place]) 08/22/2013   CAD S/P percutaneous coronary angioplasty 08/22/2013    REFERRING DIAG: [id number] (ICD-10-CM) - Hx of BKA, left    ONSET DATE:  03/19/2022 prosthesis delivery   THERAPY DIAG:  Other abnormalities of gait and mobility  Unsteadiness on feet  Pain in left lower leg  Muscle weakness (generalized)  Dizziness and giddiness  Rationale for Evaluation and Treatment Rehabilitation  PERTINENT HISTORY: CAD, STEMI, CHF, PAD, A-Fib, IDDM2, severe septic arthritis   PRECAUTIONS: Fall  SUBJECTIVE:  SUBJECTIVE STATEMENT:  She thinks drinking her protein shake in morning was causing her intestinal issues.    PAIN:  Are you having pain? Yes: NPRS scale:  today  0/10 and in last week 0/10 Pain location: left distal tibia, posterior knee & distal limb.  Pain description: sore Aggravating factors: walking & standing.  Relieving factors: taking prosthesis off   Back pain lumbar: 0/10 today & in last week 4/10 walking or standing but goes away with sitting.            OBJECTIVE: (objective measures completed at initial evaluation unless otherwise dated)  COGNITION: 04/21/2022:  Overall cognitive status: Within functional limits for tasks assessed   POSTURE: 04/21/2022:  rounded shoulders, forward head, flexed trunk , and weight shift right   LOWER EXTREMITY ROM:   ROM P:passive  A:active Left Eval 04/21/22:  Hip flexion WFL  Hip extension WFL  Hip abduction WFL  Hip adduction    Hip internal rotation    Hip external rotation    Knee flexion 90*  Knee extension -5*  Ankle dorsiflexion NA  Ankle plantarflexion NA  Ankle inversion NA  Ankle eversion NA    (Blank rows = not tested)   LOWER EXTREMITY MMT:   MMT Left Eval 04/21/22  Hip flexion 4/5  Hip extension 3+/5  Hip abduction 3+/5  Hip adduction    Hip internal rotation    Hip external rotation    Knee flexion 3+/5  Knee extension 4-/5  Ankle dorsiflexion NA  Ankle plantarflexion NA  Ankle inversion NA  Ankle eversion NA  (Blank rows = not tested)   TRANSFERS: 05/26/2022:  sit to/from stand pushing off seat of chair (no armrests needed) and stabilizes without UE support.   04/21/2022:  Sit to stand: SBA requires armrests & BUEs to push up but able to stabilize without touching RW 04/21/2022:  Stand to sit: SBA requires armrests & BUEs to control descent.    GAIT: 05/28/2022: gait velocity with SBQC 1.34 ft/sec.  05/26/2022: pt amb 85' with SBQC with minA.  Pt amb >300' with RW or rollator walker safely.   04/21/2022:  Gait pattern: step to pattern, decreased step length- Right, decreased stance time- Left, decreased hip/knee flexion- Left, circumduction- Left, Left hip hike, knee flexed in stance- Left, antalgic, lateral hip instability, trunk flexed, and abducted- Left Distance walked: 56' Assistive device utilized: Environmental consultant - 2 wheeled and TTA prosthesis Level of assistance: SBA  verbal cues only with RW Comments: excessive UE weight bearing on RW   FUNCTIONAL TESTs:  05/26/2022: Timed Up & Go Standard 24.32sec; manual TUG 26.87 sec; cognitive (recipe) TUG 30.47 sec  05/22/2022:  Berg Balance 29/56   04/28/2022:  PT assessed vertigo:  [place]  - left cervical rotation sit to supine no nystagmus, right rotation no dizziness and rolling right. -right cervical rotation sit to supine no nystagmus, left rotation slight dizziness and rolling left slight dizziness. Nestor Lewandowsky To right light dizziness sit to supine & severe dizziness sitting up. To left light dizziness sit to supine & severe dizziness sitting up.  Orthostatic Hypotension Supine no dizziness  145/70 HR 80   Standing light dizziness 119/67 HR 108 After standing 3 min  no dizziness BP 158/76 HR 112 Positive for orthostatic Hypotension   04/21/2022: Dizziness with head motions.  Berg Balance Scale: 19/56    CURRENT PROSTHETIC WEAR ASSESSMENT:  04/21/2022: Patient is independent with: prosthetic cleaning Patient is dependent with: skin check, residual limb care, correct  ply sock adjustment, proper wear schedule/adjustment, and proper weight-bearing schedule/adjustment Donning prosthesis: SBA Doffing prosthesis: Modified independence Prosthetic wear tolerance: ~14 hours total between 2x/day, 7 days/week Prosthetic weight bearing tolerance: 5 minutes with partial weight on prosthesis with limb pain 8/10 Edema: pitting Residual limb condition: no open areas but one small area distal tibia that may be internal suture working out,  shiny skin with no hair growth, redness over patella & distal tibia, no temperature issues noted. Cylindrical shape Prosthetic description: silicon liner with pin lock suspension, total contact socket with flexible inner socket, dynamic response foot/ankle       TODAY'S TREATMENT:                                                                                                                             DATE:  05/28/2022: Prosthetic Training with Transtibial Prosthesis: Pt amb 100' & 64' with [facility] with supervision. See objective for gait velocity & TUG scores. PT demo & verbal cues on sequencing on stairs with [facility] & left ascending rail (like her home).  Pt neg 3 steps with minA but right knee strength impairing.  PT demo side stepping technique with BUEs on single rail but she was too tired to try today.  Pt amb 200' with rollator walker safely.   Attempted to work on loading & unloading from car but she did not have enough strength to do so.     05/26/2022: Prosthetic Training with Transtibial Prosthesis: Pt amb 50' X 4 with SBQC working on upright posture, carrying  open water and distraction with CGA. PT demo and verbal cues on stepping over obstacle with prosthesis and SBQC.  Patient performed over 6 inch hurdle x 4 reps with minA.  PT demoed stepping over obstacles with counter near left UE.  Patient performed 4 reps and verbalizes understanding of how to perform safely at home for practice. Patient able to pick up items off floor with supervision and verbal cues. PT demo standing upright posture activity with posterior pelvis to countertop.  Patient verbalized and return demonstration understanding   05/22/2022: Prosthetic Training with Transtibial Prosthesis: PT recommended wearing prosthesis during all awake hours including when feeling sick.  Patient's fall risk is higher and stress on other lower extremity is higher when prosthesis is off.  When she is feeling sick she may not be as active but she needs the prosthesis available for mobility.  Patient verbalized understanding. Patient ambulated 25 feet x 8 with SBQC around cones for negotiation including 180* turns with min guard.  PT progressed to carrying a bottle of water initially close then open.   PT demo and verbal cues on using rolling walker to remove items from 1 area to another.  Patient verbalized understanding. PT demo picking up items off floor.  Patient able to return demonstration and verbalized understanding including set up to practice at home safely.   Self-Care: PT verbally educated pt on signs of stroke and  need to immediately seek medical attention if suspects. Pt verbalized understanding.   HOME EXERCISE PROGRAM: Access Code: [email or web address] URL: [email or web address] Date: 04/30/2022 Prepared by: Jamey Reas  Exercises - wide stance head motions eyes open  - 1 x daily - 4 x weekly - 1 sets - 10 reps - 2 seconds hold - Feet Apart with Eyes Closed with Head Motions  - 1 x daily - 4 x weekly - 1 sets - 10 reps - 2 seconds hold - stand on foam eyes open   - 1 x daily  - 4 x weekly - 1 sets - 10 reps - 2 seconds hold    Do each exercise 5-10 repetitions Hold each exercise for 2 seconds to feel your location  AT [place].  Try to find this position when standing still for activities.   USE TAPE ON FLOOR TO MARK THE MIDLINE POSITION which is even with middle of sink.  You also should try to feel with your limb pressure in socket.  You are trying to feel with limb what you used to feel with the bottom of your foot.  Side to Side Shift: Moving your hips only (not shoulders): move weight onto your left leg, HOLD/FEEL pressure in socket.  Move back to equal weight on each leg, HOLD/FEEL pressure in socket. Move weight onto your right leg, HOLD/FEEL pressure in socket. Move back to equal weight on each leg, HOLD/FEEL pressure in socket. Repeat.  Start with both hands on sink, progress to hand on prosthetic side only, then no hands.  Front to Back Shift: Moving your hips only (not shoulders): move your weight forward onto your toes, HOLD/FEEL pressure in socket. Move your weight back to equal Flat Foot on both legs, HOLD/FEEL  pressure in socket. Move your weight back onto your heels, HOLD/FEEL  pressure in socket. Move your weight back to equal on both legs, HOLD/FEEL  pressure in socket. Repeat.  Start with both hands on sink, progress to hand on prosthetic side only, then no hands.  Moving Cones / Cups: With equal weight on each leg: Hold on with one hand the first time, then progress to no hand supports. Move cups from one side of sink to the other. Place cups ~2" out of your reach, progress to 10" beyond reach.  Place one hand in middle of sink and reach with other hand. Do both arms.  Then hover one hand and move cups with other hand.  Overhead/Upward Reaching: alternated reaching up to top cabinets or ceiling if no cabinets present. Keep equal weight on each leg. Start with one hand support on  counter while other hand reaches and progress to no hand support with reaching.  ace one hand in middle of sink and reach with other hand. Do both arms.  Then hover one hand and move cups with other hand.  5.   Looking Over Shoulders: With equal weight on each leg: alternate turning to look over your shoulders with one hand support on counter as needed.  Start with head motions only to look in front of shoulder, then even with shoulder and progress to looking behind you. To look to side, move head /eyes, then shoulder on side looking pulls back, shift more weight to side looking and pull hip back. Place one hand in middle of sink and let go with other hand so your shoulder can pull back. Switch hands to look other  way.   Then hover one hand and look over shoulder. If looking right, use left hand at sink. If looking left, use right hand at sink. 6.  Stepping with leg that is not amputated:  Move items under cabinet out of your way. Shift your hips/pelvis so weight on prosthesis. Tighten muscles in hip on prosthetic side.  SLOWLY step other leg so front of foot is in cabinet. Then step back to floor.   Back & standing endurance HEP on 05/12/22 Stand at sink with chair back on your right & left and w/c or locked rollator behind you.  Place back of pelvis against counter with feet under your pelvis.  Rest your hands on counter beside you.  Time how long you can stand upright.  Try to increase time over days & weeks.  Stand facing w/c lean forward placing both hands in seat & stand back up straight, then lean forward hovering hands over seat Facing sink: Rotate upper body to place item on back of each chair on right & left. Side bend placing water bottle in each seat. Reach over head following hands with eyes.     ASSESSMENT:  CLINICAL IMPRESSION: PT worked on skills to enable pt to enter / exit her home with [facility] & single rail on 3 steps. Her right knee strength limits skill.  PT also worked on loading &  unloading rollator in her car but again her strength limited task.  TUG score all indicate high fall risk.  Pt continues to benefit from skilled PT.     OBJECTIVE IMPAIRMENTS: Abnormal gait, decreased activity tolerance, decreased balance, decreased endurance, decreased knowledge of condition, decreased knowledge of use of DME, decreased mobility, difficulty walking, decreased ROM, decreased strength, increased edema, impaired flexibility, postural dysfunction, prosthetic dependency , obesity, and pain.    ACTIVITY LIMITATIONS: carrying, lifting, bending, standing, stairs, transfers, and locomotion level   PARTICIPATION LIMITATIONS: meal prep, cleaning, laundry, driving, community activity, and church   PERSONAL FACTORS: Fitness, Time since onset of injury/illness/exacerbation, and 3+ comorbidities: see PMH  are also affecting patient's functional outcome.    REHAB POTENTIAL: Good   CLINICAL DECISION MAKING: Evolving/moderate complexity   EVALUATION COMPLEXITY: Moderate     GOALS: Goals reviewed with patient? Yes   SHORT TERM GOALS: Target date: 06/20/2022   Patient verbalizes how to adjust ply socks with limb volume changes. Baseline: SEE OBJECTIVE DATA Goal status: ongoing 05/28/2022 2.  Patient tolerates prosthesis >90% awake hrs /day without skin issues or limb pain <2/10 after standing. Baseline: SEE OBJECTIVE DATA Goal status: ongoing 05/28/2022   3. Berg Balance >/= 36/56. Baseline: SEE OBJECTIVE DATA Goal status:  ongoing 05/28/2022   4. Patient ambulates 62' with [facility] & prosthesis with supervision. Baseline: SEE OBJECTIVE DATA Goal status: ongoing 05/28/2022    LONG TERM GOALS: Target date: 07/17/2022   Patient demonstrates & verbalized understanding of prosthetic care to enable safe utilization of prosthesis. Baseline: SEE OBJECTIVE DATA Goal status: INITIAL   Patient tolerates prosthesis wear >90% of awake hours without skin or limb pain issues. Baseline: SEE OBJECTIVE  DATA Goal status: INITIAL   Berg Balance >36/56 to indicate lower fall risk Baseline: SEE OBJECTIVE DATA Goal status: INITIAL   Patient ambulates >300' with prosthesis & LRADindependently Baseline: SEE OBJECTIVE DATA Goal status: INITIAL   Patient negotiates ramps, curbs with LRAD & stairs with single rail with prosthesis independently. Baseline: SEE OBJECTIVE DATA Goal status: INITIAL   Patient verbalizes & demonstrates understanding of HEP Baseline:  SEE OBJECTIVE DATA Goal status: INITIAL   PLAN:   PT FREQUENCY: 2x/week   PT DURATION: 12 weeks   PLANNED INTERVENTIONS: Therapeutic exercises, Therapeutic activity, Neuromuscular re-education, Balance training, Gait training, Patient/Family education, Self Care, Stair training, Vestibular training, Canalith repositioning, Prosthetic training, DME instructions, and physical performance training   PLAN FOR NEXT SESSION: work on strengthening exercises,   continue gait with [facility] [email or web address]. Standing balance activities.     Jamey Reas, PT, DPT 05/28/2022, 3:59 PM

## 2022-06-02 ENCOUNTER — Ambulatory Visit (INDEPENDENT_AMBULATORY_CARE_PROVIDER_SITE_OTHER): Payer: Medicare Other | Admitting: Physical Therapy

## 2022-06-02 ENCOUNTER — Encounter: Payer: Self-pay | Admitting: Physical Therapy

## 2022-06-02 DIAGNOSIS — M6281 Muscle weakness (generalized): Secondary | ICD-10-CM | POA: Diagnosis not present

## 2022-06-02 DIAGNOSIS — R2689 Other abnormalities of gait and mobility: Secondary | ICD-10-CM

## 2022-06-02 DIAGNOSIS — R2681 Unsteadiness on feet: Secondary | ICD-10-CM | POA: Diagnosis not present

## 2022-06-02 DIAGNOSIS — M79662 Pain in left lower leg: Secondary | ICD-10-CM

## 2022-06-02 DIAGNOSIS — R29898 Other symptoms and signs involving the musculoskeletal system: Secondary | ICD-10-CM

## 2022-06-02 DIAGNOSIS — R42 Dizziness and giddiness: Secondary | ICD-10-CM

## 2022-06-02 NOTE — Therapy (Signed)
OUTPATIENT PHYSICAL THERAPY TREATMENT NOTE  Patient Name: Tammy Good MRN: [id number] DOB:1950/06/02, 72 y.o., female Today's Date: 06/02/2022  PCP: general provider REFERRING PROVIDER: Melony Overly, MD    END OF SESSION:   PT End of Session - 06/02/22 1515     Visit Number 12    Number of Visits 25    Date for PT Re-Evaluation 07/17/22    Authorization Type Medicare & BCBS state health PPO    Progress Note Due on Visit 20    PT Start Time 1513    PT Stop Time 1600    PT Time Calculation (min) 47 min    Equipment Utilized During Treatment Gait belt    Activity Tolerance Patient tolerated treatment well;Patient limited by pain    Behavior During Therapy WFL for tasks assessed/performed                    Past Medical History:  Diagnosis Date   CAD S/P percutaneous coronary angioplasty 08/2013   100% mRCA - PCI Integrity Resolute DES 3.0 mm x 38 mm - 3.35 mm; PTCA of RPA V 2.0 mm x 15 mm   CHF (congestive heart failure) (HCC)    Cholesteatoma    right   Diabetes mellitus type 2 in obese (HCC)    On insulin and Invokana   History of osteomyelitis L 5th Toe all 05/2019   s/p Partial Ray Amputation with partial closure; 6 wks Abx & LSFA Atherectomy/DEB PTA with Stent for focal dissection.   Hyperlipidemia with target LDL less than 70    Hypothyroidism (acquired)    Mild essential hypertension    Obesity (BMI 30-39.9) 11/17/2013   PAD (peripheral artery disease) ([place]) 05/26/2019   05/26/19: Abd AoGram- BLE runoff -> L SFA orbital atherectomy - PTA w/ DEB & Stent 6 x 40 Luttonix (for focal dissection) - patent Pop A with 3 V runoff. LEA Dopplers 01/03/2020: RABI (prev) 0.68 (0.53)/ RTBI (prev) 0.58 (0.33); LABI (prev) 0.80 (0.64), LTBI (prev) 0.64 (0.51); R mSFA ~50-74%, L mSFA 50-74%. Patent Prox SFA stent < 49% stenosis   Panhypopituitarism (HCC)    ST elevation myocardial infarction (STEMI) of inferior wall, subsequent episode of care ([place]) 08/2013   80%  branch of D1, 40% mid AV groove circumflex, 100% RCA with subacute thrombus -- thrombus extending into RPA V with 100% occlusion after initial angioplasty of mid RCA ;; Post MI ECHO 6/9/'15: EF 50-55%, mild LVH with moderate HK of inferior wall, Gr1 DD, mild LA dilation; mildly reduced RV function   Past Surgical History:  Procedure Laterality Date   ABDOMINAL AORTOGRAM W/LOWER EXTREMITY N/A 05/26/2019   Procedure: ABDOMINAL AORTOGRAM W/LOWER EXTREMITY;  Surgeon: Marty Heck, MD;  Location: [place] [facility];  Service: Cardiovascular;  Laterality: N/A;   ABDOMINAL AORTOGRAM W/LOWER EXTREMITY N/A 11/15/2020   Procedure: ABDOMINAL AORTOGRAM W/LOWER EXTREMITY;  Surgeon: Marty Heck, MD;  Location: [place] [facility];  Service: Cardiovascular;  Laterality: N/A;   ABDOMINAL AORTOGRAM W/LOWER EXTREMITY N/A 04/18/2021   Procedure: ABDOMINAL AORTOGRAM W/LOWER EXTREMITY;  Surgeon: Marty Heck, MD;  Location: [place] [facility];  Service: Cardiovascular;  Laterality: N/A;   ABDOMINAL AORTOGRAM W/LOWER EXTREMITY Left 07/11/2021   Procedure: ABDOMINAL AORTOGRAM W/LOWER EXTREMITY;  Surgeon: Marty Heck, MD;  Location: [place] [facility];  Service: Cardiovascular;  Laterality: Left;   AMPUTATION Left 05/25/2019   Procedure: AMPUTATION RAY 5th;  Surgeon: Trula Slade, DPM;  Location: [place];  Service: Podiatry;  Laterality: Left;   AMPUTATION Left 01/14/2022   Procedure: LEFT BELOW THE KNEE AMPUTATION;  Surgeon: Erle Crocker, MD;  Location: [place];  Service: Orthopedics;  Laterality: Left;  LENGTH OF SURGERY: 90 MINUTES   APPLICATION OF WOUND VAC Left 01/14/2022   Procedure: WOUND VAC PLACEMENT;  Surgeon: Erle Crocker, MD;  Location: [place];  Service: Orthopedics;  Laterality: Left;   BONE BIOPSY Left 04/24/2021   Procedure: BONE BIOPSY;  Surgeon: Landis Martins, DPM;  Location: [place];  Service: Podiatry;  Laterality: Left;  left ankle/fibula   BONE  BIOPSY Left 07/15/2021   Procedure: LEFT FOOT BONE BIOPSY;  Surgeon: Trula Slade, DPM;  Location: [place];  Service: Podiatry;  Laterality: Left;   Cardiac Event Monitor  July-August 2015   Sinus rhythm with PVCs   CHOLECYSTECTOMY     COLONOSCOPY N/A 08/31/2013   Procedure: COLONOSCOPY;  Surgeon: Juanita Craver, MD;  Location: [facility] ENDOSCOPY;  Service: Endoscopy;  Laterality: N/A;   CORONARY STENT INTERVENTION N/A 11/19/2020   Procedure: CORONARY STENT INTERVENTION;  Surgeon: Nelva Bush, MD;  Location: [place] [facility];  Service: Cardiovascular;  Laterality: N/A;   ESOPHAGOGASTRODUODENOSCOPY N/A 09/01/2013   Procedure: ESOPHAGOGASTRODUODENOSCOPY (EGD);  Surgeon: Beryle Beams, MD;  Location: [facility] ENDOSCOPY;  Service: Endoscopy;  Laterality: N/A;  bedside   ESOPHAGOGASTRODUODENOSCOPY (EGD) WITH PROPOFOL N/A 05/26/2021   Procedure: ESOPHAGOGASTRODUODENOSCOPY (EGD) WITH PROPOFOL;  Surgeon: Carol Ada, MD;  Location: [place];  Service: Gastroenterology;  Laterality: N/A;   EYE SURGERY Bilateral    bilateral cataracts   INCISION AND DRAINAGE Left 04/24/2021   Procedure: INCISION AND DRAINAGE;  Surgeon: Landis Martins, DPM;  Location: [place];  Service: Podiatry;  Laterality: Left;   IRRIGATION AND DEBRIDEMENT FOOT Left 07/15/2021   Procedure: IRRIGATION AND DEBRIDEMENT FOOT;  Surgeon: Trula Slade, DPM;  Location: [place];  Service: Podiatry;  Laterality: Left;   LEFT HEART CATH AND CORONARY ANGIOGRAPHY N/A 11/19/2020   Procedure: LEFT HEART CATH AND CORONARY ANGIOGRAPHY;  Surgeon: Nelva Bush, MD;  Location: [place] [facility];  Service: Cardiovascular;  Laterality: N/A;   LEFT HEART CATHETERIZATION WITH CORONARY ANGIOGRAM N/A 08/30/2013   Procedure: LEFT HEART CATHETERIZATION WITH CORONARY ANGIOGRAM;  Surgeon: Leonie Man, MD;  Location: [facility] CATH LAB: 100% mRCA (thrombus - extends to RPAV), 80% D1, 40% AVG Cx.   PERCUTANEOUS CORONARY STENT INTERVENTION (PCI-S)   08/30/2013   Procedure: PERCUTANEOUS CORONARY STENT INTERVENTION (PCI-S);  Surgeon: Leonie Man, MD;  Location: [facility] CATH LAB;  Integrity Resolute DES 2.0 mm x 38 mm -- 3.35 mm.; PTCA of proximal RPA V. - 3.0 mm x 15 mm balloon   PERIPHERAL VASCULAR ATHERECTOMY  05/26/2019   Procedure: PERIPHERAL VASCULAR ATHERECTOMY;  Surgeon: Marty Heck, MD;  Location: [place] [facility];  Service: Cardiovascular;;  Left SFA   PERIPHERAL VASCULAR BALLOON ANGIOPLASTY Left 07/11/2021   Procedure: PERIPHERAL VASCULAR BALLOON ANGIOPLASTY;  Surgeon: Marty Heck, MD;  Location: [place] [facility];  Service: Cardiovascular;  Laterality: Left;  PT TRUNK / AT   PERIPHERAL VASCULAR INTERVENTION  05/26/2019   Procedure: PERIPHERAL VASCULAR INTERVENTION;  Surgeon: Marty Heck, MD;  Location: [place] [facility];  Service: Cardiovascular;;  Left SFA   PERIPHERAL VASCULAR INTERVENTION Right 11/15/2020   Procedure: PERIPHERAL VASCULAR INTERVENTION;  Surgeon: Marty Heck, MD;  Location: [place] [facility];  Service: Cardiovascular;  Laterality: Right;  Superficial Femoral Artery   PERIPHERAL VASCULAR INTERVENTION  Left 07/11/2021   Procedure: PERIPHERAL VASCULAR INTERVENTION;  Surgeon: Marty Heck, MD;  Location: [place] [facility];  Service: Cardiovascular;  Laterality: Left;  SFA   PITUITARY SURGERY     TEE WITHOUT CARDIOVERSION N/A 04/26/2021   Procedure: TRANSESOPHAGEAL ECHOCARDIOGRAM (TEE);  Surgeon: Buford Dresser, MD;  Location: [facility] ENDOSCOPY;  Service: Cardiovascular;  Laterality: N/A;   TRANSTHORACIC ECHOCARDIOGRAM  08/30/2013   mild LVH. EF 50-55%. Moderate HK of the entire inferior myocardium. GR 1 DD. Mild LA dilation. Mildly reduced RV function   TYMPANOMASTOIDECTOMY Right 12/28/2017   Procedure: RIGHT TYMPANOMASTOIDECTOMY;  Surgeon: Leta Baptist, MD;  Location: [place];  Service: ENT;  Laterality: Right;   Patient Active Problem List   Diagnosis  Date Noted   Nocturia more than twice per night 02/03/2022   Unilateral complete BKA, left, initial encounter ([place]) 01/17/2022   Osteomyelitis ([place]) 01/13/2022   Normocytic anemia 01/13/2022   Atrial fibrillation ([place]) 08/15/2021   Septic arthritis of foot ([place]) 08/02/2021   Medication monitoring encounter 08/02/2021   Acute on chronic systolic CHF (congestive heart failure) ([place]) 06/02/2021   Hypothyroidism 06/02/2021   Ulcer of esophagus without bleeding    Pressure injury of skin 05/31/2021   Hypokalemia 05/30/2021   Symptomatic anemia 05/29/2021   AKI (acute kidney injury) ([place]) 05/29/2021   GI bleed 05/29/2021   Liver lesion 05/29/2021   Vertigo 05/24/2021   Generalized weakness 05/24/2021   Tenosynovitis of left ankle 04/25/2021   Thrombocytosis 04/23/2021   Fatty liver 06/19/2020   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 ([place]) [id number]   Atherosclerotic heart disease of native coronary artery without angina pectoris 03/20/2020   Epigastric pain 03/20/2020   Nausea 03/20/2020   Absence of toe ([place]) 06/28/2019   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   PAD (peripheral artery disease) ([place]) 05/26/2019   Osteomyelitis of left foot ([place]) 05/24/2019   Cellulitis and abscess of foot, except toes 05/18/2019   Chronic osteomyelitis of ankle and foot ([place]) 05/18/2019   Cholesteatoma 02/24/2019   Cellulitis and abscess of toe 11/09/2018   Colon cancer screening 11/30/2017   Long term (current) use of insulin ([place]) 03/05/2017   Iron deficiency anemia 12/10/2014   Obesity (BMI 30-39.9) 11/17/2013   Diabetes mellitus, type 2 ([place])    Essential hypertension    Right thigh pain 11/01/2013   PVC's (premature ventricular contractions) 11/01/2013   Status post insertion of drug eluting coronary artery stent to [facility] emergently and PTCA to prox. PLA 09/16/2013   Hyperlipidemia associated with type 2 diabetes mellitus ([place]) 09/16/2013   Presence of  coronary angioplasty implant and graft 09/08/2013   Panhypopituitarism ([place]) 08/30/2013   Non-ST elevation (NSTEMI) myocardial infarction ([place]) 08/22/2013   CAD S/P percutaneous coronary angioplasty 08/22/2013    REFERRING DIAG: [id number] (ICD-10-CM) - Hx of BKA, left    ONSET DATE:  03/19/2022 prosthesis delivery   THERAPY DIAG:  Other abnormalities of gait and mobility  Unsteadiness on feet  Pain in left lower leg  Muscle weakness (generalized)  Dizziness and giddiness  Other symptoms and signs involving the musculoskeletal system  Rationale for Evaluation and Treatment Rehabilitation  PERTINENT HISTORY: CAD, STEMI, CHF, PAD, A-Fib, IDDM2, severe septic arthritis   PRECAUTIONS: Fall  SUBJECTIVE:  SUBJECTIVE STATEMENT:  She is able to do more in kitchen with bar stool & rollator.     PAIN:  Are you having pain? Yes: NPRS scale:  today  0/10 and in last week 0/10 Pain location: left distal tibia, posterior knee & distal limb.  Pain description: sore Aggravating factors: walking & standing.  Relieving factors: taking prosthesis off   Back pain lumbar: 0/10 today & in last week 4/10 walking or standing but goes away with sitting.            OBJECTIVE: (objective measures completed at initial evaluation unless otherwise dated)  COGNITION: 04/21/2022:  Overall cognitive status: Within functional limits for tasks assessed   POSTURE: 04/21/2022:  rounded shoulders, forward head, flexed trunk , and weight shift right   LOWER EXTREMITY ROM:   ROM P:passive  A:active Left Eval 04/21/22:  Hip flexion WFL  Hip extension WFL  Hip abduction WFL  Hip adduction    Hip internal rotation    Hip external rotation    Knee flexion 90*  Knee extension -5*  Ankle dorsiflexion NA  Ankle plantarflexion NA   Ankle inversion NA  Ankle eversion NA   (Blank rows = not tested)   LOWER EXTREMITY MMT:   MMT Left Eval 04/21/22  Hip flexion 4/5  Hip extension 3+/5  Hip abduction 3+/5  Hip adduction    Hip internal rotation    Hip external rotation    Knee flexion 3+/5  Knee extension 4-/5  Ankle dorsiflexion NA  Ankle plantarflexion NA  Ankle inversion NA  Ankle eversion NA  (Blank rows = not tested)   TRANSFERS: 05/26/2022:  sit to/from stand pushing off seat of chair (no armrests needed) and stabilizes without UE support.   04/21/2022:  Sit to stand: SBA requires armrests & BUEs to push up but able to stabilize without touching RW 04/21/2022:  Stand to sit: SBA requires armrests & BUEs to control descent.    GAIT: 05/28/2022: gait velocity with SBQC 1.34 ft/sec.  05/26/2022: pt amb 66' with SBQC with minA.  Pt amb >300' with RW or rollator walker safely.   04/21/2022:  Gait pattern: step to pattern, decreased step length- Right, decreased stance time- Left, decreased hip/knee flexion- Left, circumduction- Left, Left hip hike, knee flexed in stance- Left, antalgic, lateral hip instability, trunk flexed, and abducted- Left Distance walked: 60' Assistive device utilized: Environmental consultant - 2 wheeled and TTA prosthesis Level of assistance: SBA  verbal cues only with RW Comments: excessive UE weight bearing on RW   FUNCTIONAL TESTs:  05/26/2022: Timed Up & Go Standard 24.32sec; manual TUG 26.87 sec; cognitive (recipe) TUG 30.47 sec  05/22/2022:  Berg Balance 29/56   04/28/2022:  PT assessed vertigo:  [place]  - left cervical rotation sit to supine no nystagmus, right rotation no dizziness and rolling right. -right cervical rotation sit to supine no nystagmus, left rotation slight dizziness and rolling left slight dizziness. Nestor Lewandowsky To right light dizziness sit to supine & severe dizziness sitting up. To left light dizziness sit to supine & severe dizziness sitting up.  Orthostatic  Hypotension Supine no dizziness  145/70 HR 80  Standing light dizziness 119/67 HR 108 After standing 3 min  no dizziness BP 158/76 HR 112 Positive for orthostatic Hypotension   04/21/2022: Dizziness with head motions.  Berg Balance Scale: 19/56    CURRENT PROSTHETIC WEAR ASSESSMENT:  04/21/2022: Patient is independent with: prosthetic cleaning Patient is dependent with: skin check, residual limb care,  correct ply sock adjustment, proper wear schedule/adjustment, and proper weight-bearing schedule/adjustment Donning prosthesis: SBA Doffing prosthesis: Modified independence Prosthetic wear tolerance: ~14 hours total between 2x/day, 7 days/week Prosthetic weight bearing tolerance: 5 minutes with partial weight on prosthesis with limb pain 8/10 Edema: pitting Residual limb condition: no open areas but one small area distal tibia that may be internal suture working out,  shiny skin with no hair growth, redness over patella & distal tibia, no temperature issues noted. Cylindrical shape Prosthetic description: silicon liner with pin lock suspension, total contact socket with flexible inner socket, dynamic response foot/ankle       TODAY'S TREATMENT:                                                                                                                             DATE:  06/02/2022: Prosthetic Training with Transtibial Prosthesis: Pt amb 100' X 2 with SBQC with SBA. Cues on upright posture / not staring at floor.  Neuromuscular Re-ed: working on standing balance, core stabilization & UE strength with UE resistive exercises. Initiated at pulleys with 5# ea UE on stack weights.  Prosthesis stepped forward using weight shift along with UE motions.  Rows & forward reach: Single UE 3 reps ea with contralateral UE SBQC support. BUEs row 3 reps but forward reach only 1 reps with LUE assisted.  Switched to theraband with red LUE & green RUE. See HEP below - rows, forward reach & biceps curls -  alternating UEs 5 reps ea then BUEs 3 reps.  RW in front for safety.  PT issued HO with review. Verbal, demo & tactile cues. Pt verbalized & return demo understanding.    05/28/2022: Prosthetic Training with Transtibial Prosthesis: Pt amb 100' & 45' with [facility] with supervision. See objective for gait velocity & TUG scores. PT demo & verbal cues on sequencing on stairs with [place] & left ascending rail (like her home).  Pt neg 3 steps with minA but right knee strength impairing.  PT demo side stepping technique with BUEs on single rail but she was too tired to try today.  Pt amb 200' with rollator walker safely.   Attempted to work on loading & unloading from car but she did not have enough strength to do so.     05/26/2022: Prosthetic Training with Transtibial Prosthesis: Pt amb 50' X 4 with SBQC working on upright posture, carrying open water and distraction with CGA. PT demo and verbal cues on stepping over obstacle with prosthesis and SBQC.  Patient performed over 6 inch hurdle x 4 reps with minA.  PT demoed stepping over obstacles with counter near left UE.  Patient performed 4 reps and verbalizes understanding of how to perform safely at home for practice. Patient able to pick up items off floor with supervision and verbal cues. PT demo standing upright posture activity with posterior pelvis to countertop.  Patient verbalized and return demonstration understanding   HOME EXERCISE PROGRAM: Access  Code: [email or web address] URL: [email or web address] Date: 06/02/2022 Prepared by: Jamey Reas  Exercises - wide stance head motions eyes open  - 1 x daily - 4 x weekly - 1 sets - 10 reps - 2 seconds hold - Feet Apart with Eyes Closed with Head Motions  - 1 x daily - 4 x weekly - 1 sets - 10 reps - 2 seconds hold - stand on foam eyes open   - 1 x daily - 4 x weekly - 1 sets - 10 reps - 2 seconds hold - Alternating Punch with Resistance  - 1 x daily - 3-4 x weekly - 1 sets - 3-5 reps -  Standing Scapular Protraction with Resistance  - 1 x daily - 3-4 x weekly - 1 sets - 3-5 reps - Standing alternate rows with resistance  - 1 x daily - 3-4 x weekly - 1 sets - 3-5 reps - Standing Row with Anchored Resistance  - 1 x daily - 3-4 x weekly - 1 sets - 3-5 reps - Alternating elbow flexion with resistance  - 1 x daily - 3-4 x weekly - 1 sets - 3-5 reps - Standing Bicep Curls with Resistance  - 1 x daily - 3-4 x weekly - 1 sets - 3-5 reps    Do each exercise 5-10 repetitions Hold each exercise for 2 seconds to feel your location  AT [place].  Try to find this position when standing still for activities.   USE TAPE ON FLOOR TO MARK THE MIDLINE POSITION which is even with middle of sink.  You also should try to feel with your limb pressure in socket.  You are trying to feel with limb what you used to feel with the bottom of your foot.  Side to Side Shift: Moving your hips only (not shoulders): move weight onto your left leg, HOLD/FEEL pressure in socket.  Move back to equal weight on each leg, HOLD/FEEL pressure in socket. Move weight onto your right leg, HOLD/FEEL pressure in socket. Move back to equal weight on each leg, HOLD/FEEL pressure in socket. Repeat.  Start with both hands on sink, progress to hand on prosthetic side only, then no hands.  Front to Back Shift: Moving your hips only (not shoulders): move your weight forward onto your toes, HOLD/FEEL pressure in socket. Move your weight back to equal Flat Foot on both legs, HOLD/FEEL  pressure in socket. Move your weight back onto your heels, HOLD/FEEL  pressure in socket. Move your weight back to equal on both legs, HOLD/FEEL  pressure in socket. Repeat.  Start with both hands on sink, progress to hand on prosthetic side only, then no hands.  Moving Cones / Cups: With equal weight on each leg: Hold on with one hand the first time, then progress to no hand supports.  Move cups from one side of sink to the other. Place cups ~2" out of your reach, progress to 10" beyond reach.  Place one hand in middle of sink and reach with other hand. Do both arms.  Then hover one hand and move cups with other hand.  Overhead/Upward Reaching: alternated reaching up to top cabinets or ceiling if no cabinets present. Keep equal weight on each leg. Start with one hand support on counter while other hand reaches and progress to no hand support with reaching.  ace one hand in middle of sink and reach with other hand. Do both arms.  Then hover one hand and move cups with other hand.  5.   Looking Over Shoulders: With equal weight on each leg: alternate turning to look over your shoulders with one hand support on counter as needed.  Start with head motions only to look in front of shoulder, then even with shoulder and progress to looking behind you. To look to side, move head /eyes, then shoulder on side looking pulls back, shift more weight to side looking and pull hip back. Place one hand in middle of sink and let go with other hand so your shoulder can pull back. Switch hands to look other way.   Then hover one hand and look over shoulder. If looking right, use left hand at sink. If looking left, use right hand at sink. 6.  Stepping with leg that is not amputated:  Move items under cabinet out of your way. Shift your hips/pelvis so weight on prosthesis. Tighten muscles in hip on prosthetic side.  SLOWLY step other leg so front of foot is in cabinet. Then step back to floor.   Back & standing endurance HEP on 05/12/22 Stand at sink with chair back on your right & left and w/c or locked rollator behind you.  Place back of pelvis against counter with feet under your pelvis.  Rest your hands on counter beside you.  Time how long you can stand upright.  Try to increase time over days & weeks.  Stand facing w/c lean forward placing both hands in seat & stand back up straight, then lean forward  hovering hands over seat Facing sink: Rotate upper body to place item on back of each chair on right & left. Side bend placing water bottle in each seat. Reach over head following hands with eyes.     ASSESSMENT:  CLINICAL IMPRESSION: PT added standing UE resistance exercises to HEP to work on standing balance, core strength & UE strength.  Patient's gait with SBQC is improving.  She is reporting increased ADL ability.   Pt continues to benefit from skilled PT.     OBJECTIVE IMPAIRMENTS: Abnormal gait, decreased activity tolerance, decreased balance, decreased endurance, decreased knowledge of condition, decreased knowledge of use of DME, decreased mobility, difficulty walking, decreased ROM, decreased strength, increased edema, impaired flexibility, postural dysfunction, prosthetic dependency , obesity, and pain.    ACTIVITY LIMITATIONS: carrying, lifting, bending, standing, stairs, transfers, and locomotion level   PARTICIPATION LIMITATIONS: meal prep, cleaning, laundry, driving, community activity, and church   PERSONAL FACTORS: Fitness, Time since onset of injury/illness/exacerbation, and 3+ comorbidities: see PMH  are also affecting patient's functional outcome.    REHAB POTENTIAL: Good   CLINICAL DECISION MAKING: Evolving/moderate complexity   EVALUATION COMPLEXITY: Moderate     GOALS: Goals reviewed with patient? Yes   SHORT TERM GOALS: Target date: 06/20/2022   Patient verbalizes how to adjust ply socks with limb volume changes. Baseline: SEE OBJECTIVE DATA Goal status: ongoing 05/28/2022 2.  Patient tolerates prosthesis >90% awake hrs /day without skin issues or limb pain <2/10 after standing. Baseline: SEE OBJECTIVE DATA Goal status: ongoing 05/28/2022   3. Berg Balance >/= 36/56. Baseline: SEE OBJECTIVE DATA Goal status:  ongoing 05/28/2022   4. Patient ambulates 61' with [facility] & prosthesis with supervision. Baseline: SEE OBJECTIVE DATA Goal status: ongoing  05/28/2022    LONG TERM GOALS: Target date: 07/17/2022   Patient demonstrates & verbalized understanding of prosthetic care to enable safe utilization of prosthesis. Baseline: SEE OBJECTIVE DATA Goal status: INITIAL  Patient tolerates prosthesis wear >90% of awake hours without skin or limb pain issues. Baseline: SEE OBJECTIVE DATA Goal status: INITIAL   Berg Balance >36/56 to indicate lower fall risk Baseline: SEE OBJECTIVE DATA Goal status: INITIAL   Patient ambulates >300' with prosthesis & LRADindependently Baseline: SEE OBJECTIVE DATA Goal status: INITIAL   Patient negotiates ramps, curbs with LRAD & stairs with single rail with prosthesis independently. Baseline: SEE OBJECTIVE DATA Goal status: INITIAL   Patient verbalizes & demonstrates understanding of HEP Baseline: SEE OBJECTIVE DATA Goal status: INITIAL   PLAN:   PT FREQUENCY: 2x/week   PT DURATION: 12 weeks   PLANNED INTERVENTIONS: Therapeutic exercises, Therapeutic activity, Neuromuscular re-education, Balance training, Gait training, Patient/Family education, Self Care, Stair training, Vestibular training, Canalith repositioning, Prosthetic training, DME instructions, and physical performance training   PLAN FOR NEXT SESSION: check on UE resistance band exercises with review if needed.  work on Hotel manager exercises,   continue gait with [facility] for household mobility. Standing balance activities.     Jamey Reas, PT, DPT 06/02/2022, 4:16 PM

## 2022-06-04 ENCOUNTER — Encounter: Payer: Self-pay | Admitting: Physical Therapy

## 2022-06-04 ENCOUNTER — Ambulatory Visit (INDEPENDENT_AMBULATORY_CARE_PROVIDER_SITE_OTHER): Payer: Medicare Other | Admitting: Physical Therapy

## 2022-06-04 DIAGNOSIS — M79662 Pain in left lower leg: Secondary | ICD-10-CM | POA: Diagnosis not present

## 2022-06-04 DIAGNOSIS — R2681 Unsteadiness on feet: Secondary | ICD-10-CM | POA: Diagnosis not present

## 2022-06-04 DIAGNOSIS — M6281 Muscle weakness (generalized): Secondary | ICD-10-CM | POA: Diagnosis not present

## 2022-06-04 DIAGNOSIS — R29898 Other symptoms and signs involving the musculoskeletal system: Secondary | ICD-10-CM

## 2022-06-04 DIAGNOSIS — R42 Dizziness and giddiness: Secondary | ICD-10-CM

## 2022-06-04 DIAGNOSIS — R2689 Other abnormalities of gait and mobility: Secondary | ICD-10-CM | POA: Diagnosis not present

## 2022-06-04 NOTE — Therapy (Signed)
OUTPATIENT PHYSICAL THERAPY TREATMENT NOTE  Patient Name: Tammy Good MRN: [id number] DOB:[birth year], 72 y.o., female Today's Date: 06/04/2022  PCP: general provider REFERRING PROVIDER: Melony Overly, MD    END OF SESSION:   PT End of Session - 06/04/22 1518     Visit Number 13    Number of Visits 25    Date for PT Re-Evaluation 07/17/22    Authorization Type Medicare & BCBS state health PPO    Progress Note Due on Visit 20    PT Start Time 1515    PT Stop Time 1555    PT Time Calculation (min) 40 min    Equipment Utilized During Treatment Gait belt    Activity Tolerance Patient tolerated treatment well;Patient limited by pain    Behavior During Therapy WFL for tasks assessed/performed                     Past Medical History:  Diagnosis Date   CAD S/P percutaneous coronary angioplasty 08/2013   100% mRCA - PCI Integrity Resolute DES 3.0 mm x 38 mm - 3.35 mm; PTCA of RPA V 2.0 mm x 15 mm   CHF (congestive heart failure) ([place])    Cholesteatoma    right   Diabetes mellitus type 2 in obese (HCC)    On insulin and Invokana   History of osteomyelitis L 5th Toe all 05/2019   s/p Partial Ray Amputation with partial closure; 6 wks Abx & LSFA Atherectomy/DEB PTA with Stent for focal dissection.   Hyperlipidemia with target LDL less than 70    Hypothyroidism (acquired)    Mild essential hypertension    Obesity (BMI 30-39.9) 11/17/2013   PAD (peripheral artery disease) ([place]) 05/26/2019   05/26/19: Abd AoGram- BLE runoff -> L SFA orbital atherectomy - PTA w/ DEB & Stent 6 x 40 Luttonix (for focal dissection) - patent Pop A with 3 V runoff. LEA Dopplers 01/03/2020: RABI (prev) 0.68 (0.53)/ RTBI (prev) 0.58 (0.33); LABI (prev) 0.80 (0.64), LTBI (prev) 0.64 (0.51); R mSFA ~50-74%, L mSFA 50-74%. Patent Prox SFA stent < 49% stenosis   Panhypopituitarism (HCC)    ST elevation myocardial infarction (STEMI) of inferior wall, subsequent episode of care ([place]) 08/2013   80%  branch of D1, 40% mid AV groove circumflex, 100% RCA with subacute thrombus -- thrombus extending into RPA V with 100% occlusion after initial angioplasty of mid RCA ;; Post MI ECHO 6/9/'15: EF 50-55%, mild LVH with moderate HK of inferior wall, Gr1 DD, mild LA dilation; mildly reduced RV function   Past Surgical History:  Procedure Laterality Date   ABDOMINAL AORTOGRAM W/LOWER EXTREMITY N/A 05/26/2019   Procedure: ABDOMINAL AORTOGRAM W/LOWER EXTREMITY;  Surgeon: Marty Heck, MD;  Location: [place] [facility];  Service: Cardiovascular;  Laterality: N/A;   ABDOMINAL AORTOGRAM W/LOWER EXTREMITY N/A 11/15/2020   Procedure: ABDOMINAL AORTOGRAM W/LOWER EXTREMITY;  Surgeon: Marty Heck, MD;  Location: [place] [facility];  Service: Cardiovascular;  Laterality: N/A;   ABDOMINAL AORTOGRAM W/LOWER EXTREMITY N/A 04/18/2021   Procedure: ABDOMINAL AORTOGRAM W/LOWER EXTREMITY;  Surgeon: Marty Heck, MD;  Location: [place] [facility];  Service: Cardiovascular;  Laterality: N/A;   ABDOMINAL AORTOGRAM W/LOWER EXTREMITY Left 07/11/2021   Procedure: ABDOMINAL AORTOGRAM W/LOWER EXTREMITY;  Surgeon: Marty Heck, MD;  Location: [place] [facility];  Service: Cardiovascular;  Laterality: Left;   AMPUTATION Left 05/25/2019   Procedure: AMPUTATION RAY 5th;  Surgeon: Trula Slade, DPM;  [facility];  Service: Podiatry;  Laterality: Left;   AMPUTATION Left 01/14/2022   Procedure: LEFT BELOW THE KNEE AMPUTATION;  Surgeon: Erle Crocker, MD;  Location: [place];  Service: Orthopedics;  Laterality: Left;  LENGTH OF SURGERY: 90 MINUTES   APPLICATION OF WOUND VAC Left 01/14/2022   Procedure: WOUND VAC PLACEMENT;  Surgeon: Erle Crocker, MD;  Location: [place];  Service: Orthopedics;  Laterality: Left;   BONE BIOPSY Left 04/24/2021   Procedure: BONE BIOPSY;  Surgeon: Landis Martins, DPM;  Location: [place];  Service: Podiatry;  Laterality: Left;  left ankle/fibula   BONE  BIOPSY Left 07/15/2021   Procedure: LEFT FOOT BONE BIOPSY;  Surgeon: Trula Slade, DPM;  Location: [place];  Service: Podiatry;  Laterality: Left;   Cardiac Event Monitor  July-August 2015   Sinus rhythm with PVCs   CHOLECYSTECTOMY     COLONOSCOPY N/A 08/31/2013   Procedure: COLONOSCOPY;  Surgeon: Juanita Craver, MD;  Location: [facility] ENDOSCOPY;  Service: Endoscopy;  Laterality: N/A;   CORONARY STENT INTERVENTION N/A 11/19/2020   Procedure: CORONARY STENT INTERVENTION;  Surgeon: Nelva Bush, MD;  Location: [place] [facility];  Service: Cardiovascular;  Laterality: N/A;   ESOPHAGOGASTRODUODENOSCOPY N/A 09/01/2013   Procedure: ESOPHAGOGASTRODUODENOSCOPY (EGD);  Surgeon: Beryle Beams, MD;  Location: [facility] ENDOSCOPY;  Service: Endoscopy;  Laterality: N/A;  bedside   ESOPHAGOGASTRODUODENOSCOPY (EGD) WITH PROPOFOL N/A 05/26/2021   Procedure: ESOPHAGOGASTRODUODENOSCOPY (EGD) WITH PROPOFOL;  Surgeon: Carol Ada, MD;  Location: [place];  Service: Gastroenterology;  Laterality: N/A;   EYE SURGERY Bilateral    bilateral cataracts   INCISION AND DRAINAGE Left 04/24/2021   Procedure: INCISION AND DRAINAGE;  Surgeon: Landis Martins, DPM;  Location: [place];  Service: Podiatry;  Laterality: Left;   IRRIGATION AND DEBRIDEMENT FOOT Left 07/15/2021   Procedure: IRRIGATION AND DEBRIDEMENT FOOT;  Surgeon: Trula Slade, DPM;  Location: [place];  Service: Podiatry;  Laterality: Left;   LEFT HEART CATH AND CORONARY ANGIOGRAPHY N/A 11/19/2020   Procedure: LEFT HEART CATH AND CORONARY ANGIOGRAPHY;  Surgeon: Nelva Bush, MD;  Location: [place] [facility];  Service: Cardiovascular;  Laterality: N/A;   LEFT HEART CATHETERIZATION WITH CORONARY ANGIOGRAM N/A 08/30/2013   Procedure: LEFT HEART CATHETERIZATION WITH CORONARY ANGIOGRAM;  Surgeon: Leonie Man, MD;  Location: [facility] CATH LAB: 100% mRCA (thrombus - extends to RPAV), 80% D1, 40% AVG Cx.   PERCUTANEOUS CORONARY STENT INTERVENTION (PCI-S)   08/30/2013   Procedure: PERCUTANEOUS CORONARY STENT INTERVENTION (PCI-S);  Surgeon: Leonie Man, MD;  Location: [facility] CATH LAB;  Integrity Resolute DES 2.0 mm x 38 mm -- 3.35 mm.; PTCA of proximal RPA V. - 3.0 mm x 15 mm balloon   PERIPHERAL VASCULAR ATHERECTOMY  05/26/2019   Procedure: PERIPHERAL VASCULAR ATHERECTOMY;  Surgeon: Marty Heck, MD;  Location: [place] [facility];  Service: Cardiovascular;;  Left SFA   PERIPHERAL VASCULAR BALLOON ANGIOPLASTY Left 07/11/2021   Procedure: PERIPHERAL VASCULAR BALLOON ANGIOPLASTY;  Surgeon: Marty Heck, MD;  Location: [place] [facility];  Service: Cardiovascular;  Laterality: Left;  PT TRUNK / AT   PERIPHERAL VASCULAR INTERVENTION  05/26/2019   Procedure: PERIPHERAL VASCULAR INTERVENTION;  Surgeon: Marty Heck, MD;  Location: [place] [facility];  Service: Cardiovascular;;  Left SFA   PERIPHERAL VASCULAR INTERVENTION Right 11/15/2020   Procedure: PERIPHERAL VASCULAR INTERVENTION;  Surgeon: Marty Heck, MD;  Location: [place] [facility];  Service: Cardiovascular;  Laterality: Right;  Superficial Femoral Artery   PERIPHERAL  VASCULAR INTERVENTION Left 07/11/2021   Procedure: PERIPHERAL VASCULAR INTERVENTION;  Surgeon: Marty Heck, MD;  Location: [place] [facility];  Service: Cardiovascular;  Laterality: Left;  SFA   PITUITARY SURGERY     TEE WITHOUT CARDIOVERSION N/A 04/26/2021   Procedure: TRANSESOPHAGEAL ECHOCARDIOGRAM (TEE);  Surgeon: Buford Dresser, MD;  [facility];  Service: Cardiovascular;  Laterality: N/A;   TRANSTHORACIC ECHOCARDIOGRAM  08/30/2013   mild LVH. EF 50-55%. Moderate HK of the entire inferior myocardium. GR 1 DD. Mild LA dilation. Mildly reduced RV function   TYMPANOMASTOIDECTOMY Right 12/28/2017   Procedure: RIGHT TYMPANOMASTOIDECTOMY;  Surgeon: Leta Baptist, MD;  Location: [place];  Service: ENT;  Laterality: Right;   Patient Active Problem List   Diagnosis  Date Noted   Nocturia more than twice per night 02/03/2022   Unilateral complete BKA, left, initial encounter ([place]) 01/17/2022   Osteomyelitis ([place]) 01/13/2022   Normocytic anemia 01/13/2022   Atrial fibrillation ([place]) 08/15/2021   Septic arthritis of foot ([place]) 08/02/2021   Medication monitoring encounter 08/02/2021   Acute on chronic systolic CHF (congestive heart failure) ([place]) 06/02/2021   Hypothyroidism 06/02/2021   Ulcer of esophagus without bleeding    Pressure injury of skin 05/31/2021   Hypokalemia 05/30/2021   Symptomatic anemia 05/29/2021   AKI (acute kidney injury) ([place]) 05/29/2021   GI bleed 05/29/2021   Liver lesion 05/29/2021   Vertigo 05/24/2021   Generalized weakness 05/24/2021   Tenosynovitis of left ankle 04/25/2021   Thrombocytosis 04/23/2021   Fatty liver 06/19/2020   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 ([place]) [id number]   Atherosclerotic heart disease of native coronary artery without angina pectoris 03/20/2020   Epigastric pain 03/20/2020   Nausea 03/20/2020   Absence of toe ([place]) 06/28/2019   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   PAD (peripheral artery disease) ([place]) 05/26/2019   Osteomyelitis of left foot ([place]) 05/24/2019   Cellulitis and abscess of foot, except toes 05/18/2019   Chronic osteomyelitis of ankle and foot ([place]) 05/18/2019   Cholesteatoma 02/24/2019   Cellulitis and abscess of toe 11/09/2018   Colon cancer screening 11/30/2017   Long term (current) use of insulin ([place]) 03/05/2017   Iron deficiency anemia 12/10/2014   Obesity (BMI 30-39.9) 11/17/2013   Diabetes mellitus, type 2 ([place])    Essential hypertension    Right thigh pain 11/01/2013   PVC's (premature ventricular contractions) 11/01/2013   Status post insertion of drug eluting coronary artery stent to [facility] emergently and PTCA to prox. PLA 09/16/2013   Hyperlipidemia associated with type 2 diabetes mellitus ([place]) 09/16/2013   Presence of  coronary angioplasty implant and graft 09/08/2013   Panhypopituitarism ([place]) 08/30/2013   Non-ST elevation (NSTEMI) myocardial infarction ([place]) 08/22/2013   CAD S/P percutaneous coronary angioplasty 08/22/2013    REFERRING DIAG: [id number] (ICD-10-CM) - Hx of BKA, left    ONSET DATE:  03/19/2022 prosthesis delivery   THERAPY DIAG:  Other abnormalities of gait and mobility  Unsteadiness on feet  Pain in left lower leg  Muscle weakness (generalized)  Dizziness and giddiness  Other symptoms and signs involving the musculoskeletal system  Rationale for Evaluation and Treatment Rehabilitation  PERTINENT HISTORY: CAD, STEMI, CHF, PAD, A-Fib, IDDM2, severe septic arthritis   PRECAUTIONS: Fall  SUBJECTIVE:  SUBJECTIVE STATEMENT:  She is able to do more in kitchen with bar stool & rollator.     PAIN:  Are you having pain? Yes: NPRS scale:  today  0/10 and in last week 0/10 Pain location: left distal tibia, posterior knee & distal limb.  Pain description: sore Aggravating factors: walking & standing.  Relieving factors: taking prosthesis off   Back pain lumbar: 0/10 today & in last week 4/10 walking or standing but goes away with sitting.            OBJECTIVE: (objective measures completed at initial evaluation unless otherwise dated)  COGNITION: 04/21/2022:  Overall cognitive status: Within functional limits for tasks assessed   POSTURE: 04/21/2022:  rounded shoulders, forward head, flexed trunk , and weight shift right   LOWER EXTREMITY ROM:   ROM P:passive  A:active Left Eval 04/21/22:  Hip flexion WFL  Hip extension WFL  Hip abduction WFL  Hip adduction    Hip internal rotation    Hip external rotation    Knee flexion 90*  Knee extension -5*  Ankle dorsiflexion NA  Ankle plantarflexion NA   Ankle inversion NA  Ankle eversion NA   (Blank rows = not tested)   LOWER EXTREMITY MMT:   MMT Left Eval 04/21/22  Hip flexion 4/5  Hip extension 3+/5  Hip abduction 3+/5  Hip adduction    Hip internal rotation    Hip external rotation    Knee flexion 3+/5  Knee extension 4-/5  Ankle dorsiflexion NA  Ankle plantarflexion NA  Ankle inversion NA  Ankle eversion NA  (Blank rows = not tested)   TRANSFERS: 05/26/2022:  sit to/from stand pushing off seat of chair (no armrests needed) and stabilizes without UE support.   04/21/2022:  Sit to stand: SBA requires armrests & BUEs to push up but able to stabilize without touching RW 04/21/2022:  Stand to sit: SBA requires armrests & BUEs to control descent.    GAIT: 05/28/2022: gait velocity with SBQC 1.34 ft/sec.  05/26/2022: pt amb 66' with SBQC with minA.  Pt amb >300' with RW or rollator walker safely.   04/21/2022:  Gait pattern: step to pattern, decreased step length- Right, decreased stance time- Left, decreased hip/knee flexion- Left, circumduction- Left, Left hip hike, knee flexed in stance- Left, antalgic, lateral hip instability, trunk flexed, and abducted- Left Distance walked: 60' Assistive device utilized: Environmental consultant - 2 wheeled and TTA prosthesis Level of assistance: SBA  verbal cues only with RW Comments: excessive UE weight bearing on RW   FUNCTIONAL TESTs:  05/26/2022: Timed Up & Go Standard 24.32sec; manual TUG 26.87 sec; cognitive (recipe) TUG 30.47 sec  05/22/2022:  Berg Balance 29/56   04/28/2022:  PT assessed vertigo:  [place]  - left cervical rotation sit to supine no nystagmus, right rotation no dizziness and rolling right. -right cervical rotation sit to supine no nystagmus, left rotation slight dizziness and rolling left slight dizziness. Nestor Lewandowsky To right light dizziness sit to supine & severe dizziness sitting up. To left light dizziness sit to supine & severe dizziness sitting up.  Orthostatic  Hypotension Supine no dizziness  145/70 HR 80  Standing light dizziness 119/67 HR 108 After standing 3 min  no dizziness BP 158/76 HR 112 Positive for orthostatic Hypotension   04/21/2022: Dizziness with head motions.  Berg Balance Scale: 19/56    CURRENT PROSTHETIC WEAR ASSESSMENT:  04/21/2022: Patient is independent with: prosthetic cleaning Patient is dependent with: skin check, residual limb care,  correct ply sock adjustment, proper wear schedule/adjustment, and proper weight-bearing schedule/adjustment Donning prosthesis: SBA Doffing prosthesis: Modified independence Prosthetic wear tolerance: ~14 hours total between 2x/day, 7 days/week Prosthetic weight bearing tolerance: 5 minutes with partial weight on prosthesis with limb pain 8/10 Edema: pitting Residual limb condition: no open areas but one small area distal tibia that may be internal suture working out,  shiny skin with no hair growth, redness over patella & distal tibia, no temperature issues noted. Cylindrical shape Prosthetic description: silicon liner with pin lock suspension, total contact socket with flexible inner socket, dynamic response foot/ankle       TODAY'S TREATMENT:                                                                                                                             DATE:  06/04/2022: Prosthetic Training with Transtibial Prosthesis: Pt amb 100' X 3 with SBQC with SBA.  Stairs: 4 steps with left ascending rail similar to her home - side stepping with BUEs on rail with CGA & cues.  PT demo & verbal cues on moving [facility] along so available at top or bottom of steps.  She reports issue with top step out of her garage due to rail location.  Pt to take a picture to discuss options next session.   Therapeutic Exercise:  PT & pt discussed 4 components of well-rounded HEP / fitness program: flexibility, strength, balance & endurance. Some exercises may get more than one area.  She forgot but plans  to do in next couple of days, "does her [employer] cover [employer] membership?"  PT recommending to break her HEPs into groups by location.  Do 1 group M, W, F and another group T, Th, Sat. Pt verbalized understanding.  Step up & down 4" step with counter & SBQC support leading with ea LE 3 reps with PT instruction.  Pt to look into getting 4" concrete step to place on her porch near rail.     06/02/2022: Prosthetic Training with Transtibial Prosthesis: Pt amb 100' X 2 with SBQC with SBA. Cues on upright posture / not staring at floor.  Neuromuscular Re-ed: working on standing balance, core stabilization & UE strength with UE resistive exercises. Initiated at pulleys with 5# ea UE on stack weights.  Prosthesis stepped forward using weight shift along with UE motions.  Rows & forward reach: Single UE 3 reps ea with contralateral UE SBQC support. BUEs row 3 reps but forward reach only 1 reps with LUE assisted.  Switched to theraband with red LUE & green RUE. See HEP below - rows, forward reach & biceps curls - alternating UEs 5 reps ea then BUEs 3 reps.  RW in front for safety.  PT issued HO with review. Verbal, demo & tactile cues. Pt verbalized & return demo understanding.    05/28/2022: Prosthetic Training with Transtibial Prosthesis: Pt amb 100' & 16' with [facility] with supervision. See objective for gait velocity &  TUG scores. PT demo & verbal cues on sequencing on stairs with [facility] & left ascending rail (like her home).  Pt neg 3 steps with minA but right knee strength impairing.  PT demo side stepping technique with BUEs on single rail but she was too tired to try today.  Pt amb 200' with rollator walker safely.   Attempted to work on loading & unloading from car but she did not have enough strength to do so.     HOME EXERCISE PROGRAM: Access Code: [email or web address] URL: [email or web address] Date: 06/02/2022 Prepared by: Jamey Reas  Exercises - wide stance head motions eyes open  - 1 x  daily - 4 x weekly - 1 sets - 10 reps - 2 seconds hold - Feet Apart with Eyes Closed with Head Motions  - 1 x daily - 4 x weekly - 1 sets - 10 reps - 2 seconds hold - stand on foam eyes open   - 1 x daily - 4 x weekly - 1 sets - 10 reps - 2 seconds hold - Alternating Punch with Resistance  - 1 x daily - 3-4 x weekly - 1 sets - 3-5 reps - Standing Scapular Protraction with Resistance  - 1 x daily - 3-4 x weekly - 1 sets - 3-5 reps - Standing alternate rows with resistance  - 1 x daily - 3-4 x weekly - 1 sets - 3-5 reps - Standing Row with Anchored Resistance  - 1 x daily - 3-4 x weekly - 1 sets - 3-5 reps - Alternating elbow flexion with resistance  - 1 x daily - 3-4 x weekly - 1 sets - 3-5 reps - Standing Bicep Curls with Resistance  - 1 x daily - 3-4 x weekly - 1 sets - 3-5 reps    Do each exercise 5-10 repetitions Hold each exercise for 2 seconds to feel your location  AT [place].  Try to find this position when standing still for activities.   USE TAPE ON FLOOR TO MARK THE MIDLINE POSITION which is even with middle of sink.  You also should try to feel with your limb pressure in socket.  You are trying to feel with limb what you used to feel with the bottom of your foot.  Side to Side Shift: Moving your hips only (not shoulders): move weight onto your left leg, HOLD/FEEL pressure in socket.  Move back to equal weight on each leg, HOLD/FEEL pressure in socket. Move weight onto your right leg, HOLD/FEEL pressure in socket. Move back to equal weight on each leg, HOLD/FEEL pressure in socket. Repeat.  Start with both hands on sink, progress to hand on prosthetic side only, then no hands.  Front to Back Shift: Moving your hips only (not shoulders): move your weight forward onto your toes, HOLD/FEEL pressure in socket. Move your weight back to equal Flat Foot on both legs, HOLD/FEEL  pressure in socket. Move your weight back  onto your heels, HOLD/FEEL  pressure in socket. Move your weight back to equal on both legs, HOLD/FEEL  pressure in socket. Repeat.  Start with both hands on sink, progress to hand on prosthetic side only, then no hands.  Moving Cones / Cups: With equal weight on each leg: Hold on with one hand the first time, then progress to no hand supports. Move cups from one side of sink to the other. Place cups ~2" out of your reach, progress  to 10" beyond reach.  Place one hand in middle of sink and reach with other hand. Do both arms.  Then hover one hand and move cups with other hand.  Overhead/Upward Reaching: alternated reaching up to top cabinets or ceiling if no cabinets present. Keep equal weight on each leg. Start with one hand support on counter while other hand reaches and progress to no hand support with reaching.  ace one hand in middle of sink and reach with other hand. Do both arms.  Then hover one hand and move cups with other hand.  5.   Looking Over Shoulders: With equal weight on each leg: alternate turning to look over your shoulders with one hand support on counter as needed.  Start with head motions only to look in front of shoulder, then even with shoulder and progress to looking behind you. To look to side, move head /eyes, then shoulder on side looking pulls back, shift more weight to side looking and pull hip back. Place one hand in middle of sink and let go with other hand so your shoulder can pull back. Switch hands to look other way.   Then hover one hand and look over shoulder. If looking right, use left hand at sink. If looking left, use right hand at sink. 6.  Stepping with leg that is not amputated:  Move items under cabinet out of your way. Shift your hips/pelvis so weight on prosthesis. Tighten muscles in hip on prosthetic side.  SLOWLY step other leg so front of foot is in cabinet. Then step back to floor.   Back & standing endurance HEP on 05/12/22 Stand at sink with chair back on  your right & left and w/c or locked rollator behind you.  Place back of pelvis against counter with feet under your pelvis.  Rest your hands on counter beside you.  Time how long you can stand upright.  Try to increase time over days & weeks.  Stand facing w/c lean forward placing both hands in seat & stand back up straight, then lean forward hovering hands over seat Facing sink: Rotate upper body to place item on back of each chair on right & left. Side bend placing water bottle in each seat. Reach over head following hands with eyes.     ASSESSMENT:  CLINICAL IMPRESSION: Patient continues to report improved ability to perform tasks / ADLs around home.  She needs to develop more LE strength to improve her ability to neg stairs.   Pt continues to benefit from skilled PT.     OBJECTIVE IMPAIRMENTS: Abnormal gait, decreased activity tolerance, decreased balance, decreased endurance, decreased knowledge of condition, decreased knowledge of use of DME, decreased mobility, difficulty walking, decreased ROM, decreased strength, increased edema, impaired flexibility, postural dysfunction, prosthetic dependency , obesity, and pain.    ACTIVITY LIMITATIONS: carrying, lifting, bending, standing, stairs, transfers, and locomotion level   PARTICIPATION LIMITATIONS: meal prep, cleaning, laundry, driving, community activity, and church   PERSONAL FACTORS: Fitness, Time since onset of injury/illness/exacerbation, and 3+ comorbidities: see PMH  are also affecting patient's functional outcome.    REHAB POTENTIAL: Good   CLINICAL DECISION MAKING: Evolving/moderate complexity   EVALUATION COMPLEXITY: Moderate     GOALS: Goals reviewed with patient? Yes   SHORT TERM GOALS: Target date: 06/20/2022   Patient verbalizes how to adjust ply socks with limb volume changes. Baseline: SEE OBJECTIVE DATA Goal status: ongoing 05/28/2022 2.  Patient tolerates prosthesis >90% awake hrs /day without skin issues  or  limb pain <2/10 after standing. Baseline: SEE OBJECTIVE DATA Goal status: ongoing 05/28/2022   3. Berg Balance >/= 36/56. Baseline: SEE OBJECTIVE DATA Goal status:  ongoing 05/28/2022   4. Patient ambulates 72' with [facility] & prosthesis with supervision. Baseline: SEE OBJECTIVE DATA Goal status: ongoing 05/28/2022    LONG TERM GOALS: Target date: 07/17/2022   Patient demonstrates & verbalized understanding of prosthetic care to enable safe utilization of prosthesis. Baseline: SEE OBJECTIVE DATA Goal status: INITIAL   Patient tolerates prosthesis wear >90% of awake hours without skin or limb pain issues. Baseline: SEE OBJECTIVE DATA Goal status: INITIAL   Berg Balance >36/56 to indicate lower fall risk Baseline: SEE OBJECTIVE DATA Goal status: INITIAL   Patient ambulates >300' with prosthesis & LRADindependently Baseline: SEE OBJECTIVE DATA Goal status: INITIAL   Patient negotiates ramps, curbs with LRAD & stairs with single rail with prosthesis independently. Baseline: SEE OBJECTIVE DATA Goal status: INITIAL   Patient verbalizes & demonstrates understanding of HEP Baseline: SEE OBJECTIVE DATA Goal status: INITIAL   PLAN:   PT FREQUENCY: 2x/week   PT DURATION: 12 weeks   PLANNED INTERVENTIONS: Therapeutic exercises, Therapeutic activity, Neuromuscular re-education, Balance training, Gait training, Patient/Family education, Self Care, Stair training, Vestibular training, Canalith repositioning, Prosthetic training, DME instructions, and physical performance training   PLAN FOR NEXT SESSION: work towards STGs, perform TUG with SBQC,   work on strengthening exercises,   continue gait with SBQC for household mobility. Standing balance activities.     Jamey Reas, PT, DPT 06/04/2022, 4:09 PM

## 2022-06-09 ENCOUNTER — Encounter: Payer: Self-pay | Admitting: Physical Therapy

## 2022-06-09 ENCOUNTER — Ambulatory Visit (INDEPENDENT_AMBULATORY_CARE_PROVIDER_SITE_OTHER): Payer: Medicare Other | Admitting: Physical Therapy

## 2022-06-09 DIAGNOSIS — M79662 Pain in left lower leg: Secondary | ICD-10-CM | POA: Diagnosis not present

## 2022-06-09 DIAGNOSIS — R2689 Other abnormalities of gait and mobility: Secondary | ICD-10-CM | POA: Diagnosis not present

## 2022-06-09 DIAGNOSIS — R2681 Unsteadiness on feet: Secondary | ICD-10-CM | POA: Diagnosis not present

## 2022-06-09 DIAGNOSIS — R29898 Other symptoms and signs involving the musculoskeletal system: Secondary | ICD-10-CM

## 2022-06-09 DIAGNOSIS — M6281 Muscle weakness (generalized): Secondary | ICD-10-CM | POA: Diagnosis not present

## 2022-06-09 DIAGNOSIS — R42 Dizziness and giddiness: Secondary | ICD-10-CM

## 2022-06-09 NOTE — Therapy (Signed)
OUTPATIENT PHYSICAL THERAPY TREATMENT NOTE  Patient Name: Tammy Good MRN: [id number] DOB:[birth year], 72 y.o., female Today's Date: 06/09/2022  PCP: general provider REFERRING PROVIDER: Melony Overly, MD    END OF SESSION:   PT End of Session - 06/09/22 1515     Visit Number 14    Number of Visits 25    Date for PT Re-Evaluation 07/17/22    Authorization Type Medicare & BCBS state health PPO    Progress Note Due on Visit 20    PT Start Time 1517    PT Stop Time 1540    PT Time Calculation (min) 23 min    Equipment Utilized During Treatment Gait belt    Activity Tolerance Patient tolerated treatment well;Patient limited by pain    Behavior During Therapy WFL for tasks assessed/performed                      Past Medical History:  Diagnosis Date   CAD S/P percutaneous coronary angioplasty 08/2013   100% mRCA - PCI Integrity Resolute DES 3.0 mm x 38 mm - 3.35 mm; PTCA of RPA V 2.0 mm x 15 mm   CHF (congestive heart failure) ([place])    Cholesteatoma    right   Diabetes mellitus type 2 in obese (HCC)    On insulin and Invokana   History of osteomyelitis L 5th Toe all 05/2019   s/p Partial Ray Amputation with partial closure; 6 wks Abx & LSFA Atherectomy/DEB PTA with Stent for focal dissection.   Hyperlipidemia with target LDL less than 70    Hypothyroidism (acquired)    Mild essential hypertension    Obesity (BMI 30-39.9) 11/17/2013   PAD (peripheral artery disease) ([place]) 05/26/2019   05/26/19: Abd AoGram- BLE runoff -> L SFA orbital atherectomy - PTA w/ DEB & Stent 6 x 40 Luttonix (for focal dissection) - patent Pop A with 3 V runoff. LEA Dopplers 01/03/2020: RABI (prev) 0.68 (0.53)/ RTBI (prev) 0.58 (0.33); LABI (prev) 0.80 (0.64), LTBI (prev) 0.64 (0.51); R mSFA ~50-74%, L mSFA 50-74%. Patent Prox SFA stent < 49% stenosis   Panhypopituitarism (HCC)    ST elevation myocardial infarction (STEMI) of inferior wall, subsequent episode of care ([place]) 08/2013    80% branch of D1, 40% mid AV groove circumflex, 100% RCA with subacute thrombus -- thrombus extending into RPA V with 100% occlusion after initial angioplasty of mid RCA ;; Post MI ECHO 6/9/'15: EF 50-55%, mild LVH with moderate HK of inferior wall, Gr1 DD, mild LA dilation; mildly reduced RV function   Past Surgical History:  Procedure Laterality Date   ABDOMINAL AORTOGRAM W/LOWER EXTREMITY N/A 05/26/2019   Procedure: ABDOMINAL AORTOGRAM W/LOWER EXTREMITY;  Surgeon: Marty Heck, MD;  Location: [place] [facility];  Service: Cardiovascular;  Laterality: N/A;   ABDOMINAL AORTOGRAM W/LOWER EXTREMITY N/A 11/15/2020   Procedure: ABDOMINAL AORTOGRAM W/LOWER EXTREMITY;  Surgeon: Marty Heck, MD;  Location: [place] [facility];  Service: Cardiovascular;  Laterality: N/A;   ABDOMINAL AORTOGRAM W/LOWER EXTREMITY N/A 04/18/2021   Procedure: ABDOMINAL AORTOGRAM W/LOWER EXTREMITY;  Surgeon: Marty Heck, MD;  Location: [place] [facility];  Service: Cardiovascular;  Laterality: N/A;   ABDOMINAL AORTOGRAM W/LOWER EXTREMITY Left 07/11/2021   Procedure: ABDOMINAL AORTOGRAM W/LOWER EXTREMITY;  Surgeon: Marty Heck, MD;  Location: [place] [facility];  Service: Cardiovascular;  Laterality: Left;   AMPUTATION Left 05/25/2019   Procedure: AMPUTATION RAY 5th;  Surgeon: Trula Slade, DPM;  Location:  [place] [facility];  Service: Podiatry;  Laterality: Left;   AMPUTATION Left 01/14/2022   Procedure: LEFT BELOW THE KNEE AMPUTATION;  Surgeon: Erle Crocker, MD;  Location: [place];  Service: Orthopedics;  Laterality: Left;  LENGTH OF SURGERY: 90 MINUTES   APPLICATION OF WOUND VAC Left 01/14/2022   Procedure: WOUND VAC PLACEMENT;  Surgeon: Erle Crocker, MD;  Location: [place];  Service: Orthopedics;  Laterality: Left;   BONE BIOPSY Left 04/24/2021   Procedure: BONE BIOPSY;  Surgeon: Landis Martins, DPM;  Location: [place];  Service: Podiatry;  Laterality: Left;  left ankle/fibula   BONE  BIOPSY Left 07/15/2021   Procedure: LEFT FOOT BONE BIOPSY;  Surgeon: Trula Slade, DPM;  Location: [place];  Service: Podiatry;  Laterality: Left;   Cardiac Event Monitor  July-August 2015   Sinus rhythm with PVCs   CHOLECYSTECTOMY     COLONOSCOPY N/A 08/31/2013   Procedure: COLONOSCOPY;  Surgeon: Juanita Craver, MD;  Location[facility];  Service: Endoscopy;  Laterality: N/A;   CORONARY STENT INTERVENTION N/A 11/19/2020   Procedure: CORONARY STENT INTERVENTION;  Surgeon: Nelva Bush, MD;  Location: [place] [facility];  Service: Cardiovascular;  Laterality: N/A;   ESOPHAGOGASTRODUODENOSCOPY N/A 09/01/2013   Procedure: ESOPHAGOGASTRODUODENOSCOPY (EGD);  Surgeon: Beryle Beams, MD;  Location[facility];  Service: Endoscopy;  Laterality: N/A;  bedside   ESOPHAGOGASTRODUODENOSCOPY (EGD) WITH PROPOFOL N/A 05/26/2021   Procedure: ESOPHAGOGASTRODUODENOSCOPY (EGD) WITH PROPOFOL;  Surgeon: Carol Ada, MD;  Location: [place];  Service: Gastroenterology;  Laterality: N/A;   EYE SURGERY Bilateral    bilateral cataracts   INCISION AND DRAINAGE Left 04/24/2021   Procedure: INCISION AND DRAINAGE;  Surgeon: Landis Martins, DPM;  Location: [place];  Service: Podiatry;  Laterality: Left;   IRRIGATION AND DEBRIDEMENT FOOT Left 07/15/2021   Procedure: IRRIGATION AND DEBRIDEMENT FOOT;  Surgeon: Trula Slade, DPM;  Location: [place];  Service: Podiatry;  Laterality: Left;   LEFT HEART CATH AND CORONARY ANGIOGRAPHY N/A 11/19/2020   Procedure: LEFT HEART CATH AND CORONARY ANGIOGRAPHY;  Surgeon: Nelva Bush, MD;  Location: [place] [facility];  Service: Cardiovascular;  Laterality: N/A;   LEFT HEART CATHETERIZATION WITH CORONARY ANGIOGRAM N/A 08/30/2013   Procedure: LEFT HEART CATHETERIZATION WITH CORONARY ANGIOGRAM;  Surgeon: Leonie Man, MD;  Location: [facility] CATH LAB: 100% mRCA (thrombus - extends to RPAV), 80% D1, 40% AVG Cx.   PERCUTANEOUS CORONARY STENT INTERVENTION (PCI-S)   08/30/2013   Procedure: PERCUTANEOUS CORONARY STENT INTERVENTION (PCI-S);  Surgeon: Leonie Man, MD;  Location: [facility] CATH LAB;  Integrity Resolute DES 2.0 mm x 38 mm -- 3.35 mm.; PTCA of proximal RPA V. - 3.0 mm x 15 mm balloon   PERIPHERAL VASCULAR ATHERECTOMY  05/26/2019   Procedure: PERIPHERAL VASCULAR ATHERECTOMY;  Surgeon: Marty Heck, MD;  Location: [place] [facility];  Service: Cardiovascular;;  Left SFA   PERIPHERAL VASCULAR BALLOON ANGIOPLASTY Left 07/11/2021   Procedure: PERIPHERAL VASCULAR BALLOON ANGIOPLASTY;  Surgeon: Marty Heck, MD;  Location: [place] [facility];  Service: Cardiovascular;  Laterality: Left;  PT TRUNK / AT   PERIPHERAL VASCULAR INTERVENTION  05/26/2019   Procedure: PERIPHERAL VASCULAR INTERVENTION;  Surgeon: Marty Heck, MD;  Location: [place] [facility];  Service: Cardiovascular;;  Left SFA   PERIPHERAL VASCULAR INTERVENTION Right 11/15/2020   Procedure: PERIPHERAL VASCULAR INTERVENTION;  Surgeon: Marty Heck, MD;  Location: [place] [facility];  Service: Cardiovascular;  Laterality: Right;  Superficial Femoral Artery  PERIPHERAL VASCULAR INTERVENTION Left 07/11/2021   Procedure: PERIPHERAL VASCULAR INTERVENTION;  Surgeon: Marty Heck, MD;  Location: [place] [facility];  Service: Cardiovascular;  Laterality: Left;  SFA   PITUITARY SURGERY     TEE WITHOUT CARDIOVERSION N/A 04/26/2021   Procedure: TRANSESOPHAGEAL ECHOCARDIOGRAM (TEE);  Surgeon: Buford Dresser, MD;  Location: [facility] ENDOSCOPY;  Service: Cardiovascular;  Laterality: N/A;   TRANSTHORACIC ECHOCARDIOGRAM  08/30/2013   mild LVH. EF 50-55%. Moderate HK of the entire inferior myocardium. GR 1 DD. Mild LA dilation. Mildly reduced RV function   TYMPANOMASTOIDECTOMY Right 12/28/2017   Procedure: RIGHT TYMPANOMASTOIDECTOMY;  Surgeon: Leta Baptist, MD;  Location: [place];  Service: ENT;  Laterality: Right;   Patient Active Problem List   Diagnosis  Date Noted   Nocturia more than twice per night 02/03/2022   Unilateral complete BKA, left, initial encounter ([place]) 01/17/2022   Osteomyelitis ([place]) 01/13/2022   Normocytic anemia 01/13/2022   Atrial fibrillation ([place]) 08/15/2021   Septic arthritis of foot ([place]) 08/02/2021   Medication monitoring encounter 08/02/2021   Acute on chronic systolic CHF (congestive heart failure) ([place]) 06/02/2021   Hypothyroidism 06/02/2021   Ulcer of esophagus without bleeding    Pressure injury of skin 05/31/2021   Hypokalemia 05/30/2021   Symptomatic anemia 05/29/2021   AKI (acute kidney injury) ([place]) 05/29/2021   GI bleed 05/29/2021   Liver lesion 05/29/2021   Vertigo 05/24/2021   Generalized weakness 05/24/2021   Tenosynovitis of left ankle 04/25/2021   Thrombocytosis 04/23/2021   Fatty liver 06/19/2020   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 ([place]) [id number]   Atherosclerotic heart disease of native coronary artery without angina pectoris 03/20/2020   Epigastric pain 03/20/2020   Nausea 03/20/2020   Absence of toe ([place]) 06/28/2019   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   PAD (peripheral artery disease) ([place]) 05/26/2019   Osteomyelitis of left foot ([place]) 05/24/2019   Cellulitis and abscess of foot, except toes 05/18/2019   Chronic osteomyelitis of ankle and foot ([place]) 05/18/2019   Cholesteatoma 02/24/2019   Cellulitis and abscess of toe 11/09/2018   Colon cancer screening 11/30/2017   Long term (current) use of insulin ([place]) 03/05/2017   Iron deficiency anemia 12/10/2014   Obesity (BMI 30-39.9) 11/17/2013   Diabetes mellitus, type 2 ([place])    Essential hypertension    Right thigh pain 11/01/2013   PVC's (premature ventricular contractions) 11/01/2013   Status post insertion of drug eluting coronary artery stent to [facility] emergently and PTCA to prox. PLA 09/16/2013   Hyperlipidemia associated with type 2 diabetes mellitus ([place]) 09/16/2013   Presence of  coronary angioplasty implant and graft 09/08/2013   Panhypopituitarism ([place]) 08/30/2013   Non-ST elevation (NSTEMI) myocardial infarction ([place]) 08/22/2013   CAD S/P percutaneous coronary angioplasty 08/22/2013    REFERRING DIAG: [id number] (ICD-10-CM) - Hx of BKA, left    ONSET DATE:  03/19/2022 prosthesis delivery   THERAPY DIAG:  Other abnormalities of gait and mobility  Unsteadiness on feet  Pain in left lower leg  Muscle weakness (generalized)  Dizziness and giddiness  Other symptoms and signs involving the musculoskeletal system  Rationale for Evaluation and Treatment Rehabilitation  PERTINENT HISTORY: CAD, STEMI, CHF, PAD, A-Fib, IDDM2, severe septic arthritis   PRECAUTIONS: Fall  SUBJECTIVE:  SUBJECTIVE STATEMENT:  She spent evening with her sister at hospital so she is tired and uncertain how much she can do today.   PAIN:  Are you having pain? Yes: NPRS scale:  today  0/10 and in last week 0/10 Pain location: left distal tibia, posterior knee & distal limb.  Pain description: sore Aggravating factors: walking & standing.  Relieving factors: taking prosthesis off   Back pain lumbar: 0/10 today & in last week 4/10 walking or standing but goes away with sitting.            OBJECTIVE: (objective measures completed at initial evaluation unless otherwise dated)  COGNITION: 04/21/2022:  Overall cognitive status: Within functional limits for tasks assessed   POSTURE: 04/21/2022:  rounded shoulders, forward head, flexed trunk , and weight shift right   LOWER EXTREMITY ROM:   ROM P:passive  A:active Left Eval 04/21/22:  Hip flexion WFL  Hip extension WFL  Hip abduction WFL  Hip adduction    Hip internal rotation    Hip external rotation    Knee flexion 90*  Knee extension -5*  Ankle  dorsiflexion NA  Ankle plantarflexion NA  Ankle inversion NA  Ankle eversion NA   (Blank rows = not tested)   LOWER EXTREMITY MMT:   MMT Left Eval 04/21/22  Hip flexion 4/5  Hip extension 3+/5  Hip abduction 3+/5  Hip adduction    Hip internal rotation    Hip external rotation    Knee flexion 3+/5  Knee extension 4-/5  Ankle dorsiflexion NA  Ankle plantarflexion NA  Ankle inversion NA  Ankle eversion NA  (Blank rows = not tested)   TRANSFERS: 05/26/2022:  sit to/from stand pushing off seat of chair (no armrests needed) and stabilizes without UE support.   04/21/2022:  Sit to stand: SBA requires armrests & BUEs to push up but able to stabilize without touching RW 04/21/2022:  Stand to sit: SBA requires armrests & BUEs to control descent.    GAIT: 05/28/2022: gait velocity with SBQC 1.34 ft/sec.  05/26/2022: pt amb 98' with SBQC with minA.  Pt amb >300' with RW or rollator walker safely.   04/21/2022:  Gait pattern: step to pattern, decreased step length- Right, decreased stance time- Left, decreased hip/knee flexion- Left, circumduction- Left, Left hip hike, knee flexed in stance- Left, antalgic, lateral hip instability, trunk flexed, and abducted- Left Distance walked: 88' Assistive device utilized: Environmental consultant - 2 wheeled and TTA prosthesis Level of assistance: SBA  verbal cues only with RW Comments: excessive UE weight bearing on RW   FUNCTIONAL TESTs:  05/26/2022: Timed Up & Go Standard 24.32sec; manual TUG 26.87 sec; cognitive (recipe) TUG 30.47 sec  05/22/2022:  Berg Balance 29/56   04/28/2022:  PT assessed vertigo:  [place] Caves  - left cervical rotation sit to supine no nystagmus, right rotation no dizziness and rolling right. -right cervical rotation sit to supine no nystagmus, left rotation slight dizziness and rolling left slight dizziness. Nestor Lewandowsky To right light dizziness sit to supine & severe dizziness sitting up. To left light dizziness sit to supine & severe  dizziness sitting up.  Orthostatic Hypotension Supine no dizziness  145/70 HR 80  Standing light dizziness 119/67 HR 108 After standing 3 min  no dizziness BP 158/76 HR 112 Positive for orthostatic Hypotension   04/21/2022: Dizziness with head motions.  Berg Balance Scale: 19/56    CURRENT PROSTHETIC WEAR ASSESSMENT:  04/21/2022: Patient is independent with: prosthetic cleaning Patient is dependent with:  skin check, residual limb care, correct ply sock adjustment, proper wear schedule/adjustment, and proper weight-bearing schedule/adjustment Donning prosthesis: SBA Doffing prosthesis: Modified independence Prosthetic wear tolerance: ~14 hours total between 2x/day, 7 days/week Prosthetic weight bearing tolerance: 5 minutes with partial weight on prosthesis with limb pain 8/10 Edema: pitting Residual limb condition: no open areas but one small area distal tibia that may be internal suture working out,  shiny skin with no hair growth, redness over patella & distal tibia, no temperature issues noted. Cylindrical shape Prosthetic description: silicon liner with pin lock suspension, total contact socket with flexible inner socket, dynamic response foot/ankle       TODAY'S TREATMENT:                                                                                                                             DATE:  06/09/2022: Prosthetic Training with Transtibial Prosthesis: PT spent session on education due to fatigue (see subjective) PT educated on need to have liner off for 6 hours due to excessive moisture.  If has evening like last night or falls asleep with prosthesis on limb, then try to remove overnight. Pt verbalized understanding.  PT verbally educated on signs of sweating and need to dry limb / liner by patting. PT recommending use of antiperspirant on limb to decrease sweating especially in summer.  Pt verbalized understanding.  PT instructed in fall risk when prosthesis is off. Pt  reports frequent urination over night with issues getting to bathroom. PT recommended trying to don liner supine or side lying so gravity will not play as large of factor on bladder. Then clicking prosthesis on limb and using RW to bathroom.  Pt verbalized understanding.   06/04/2022: Prosthetic Training with Transtibial Prosthesis: Pt amb 100' X 3 with SBQC with SBA.  Stairs: 4 steps with left ascending rail similar to her home - side stepping with BUEs on rail with CGA & cues.  PT demo & verbal cues on moving [facility] along so available at top or bottom of steps.  She reports issue with top step out of her garage due to rail location.  Pt to take a picture to discuss options next session.   Therapeutic Exercise:  PT & pt discussed 4 components of well-rounded HEP / fitness program: flexibility, strength, balance & endurance. Some exercises may get more than one area.  She forgot but plans to do in next couple of days, "does her [place] cover [employer] membership?"  PT recommending to break her HEPs into groups by location.  Do 1 group M, W, F and another group T, Th, Sat. Pt verbalized understanding.  Step up & down 4" step with counter & SBQC support leading with ea LE 3 reps with PT instruction.  Pt to look into getting 4" concrete step to place on her porch near rail.     06/02/2022: Prosthetic Training with Transtibial Prosthesis: Pt amb 100' X 2 with SBQC with SBA.  Cues on upright posture / not staring at floor.  Neuromuscular Re-ed: working on standing balance, core stabilization & UE strength with UE resistive exercises. Initiated at pulleys with 5# ea UE on stack weights.  Prosthesis stepped forward using weight shift along with UE motions.  Rows & forward reach: Single UE 3 reps ea with contralateral UE SBQC support. BUEs row 3 reps but forward reach only 1 reps with LUE assisted.  Switched to theraband with red LUE & green RUE. See HEP below - rows, forward reach & biceps curls - alternating  UEs 5 reps ea then BUEs 3 reps.  RW in front for safety.  PT issued HO with review. Verbal, demo & tactile cues. Pt verbalized & return demo understanding.    HOME EXERCISE PROGRAM: Access Code: [email or web address] URL: [email or web address] Date: 06/02/2022 Prepared by: Jamey Reas  Exercises - wide stance head motions eyes open  - 1 x daily - 4 x weekly - 1 sets - 10 reps - 2 seconds hold - Feet Apart with Eyes Closed with Head Motions  - 1 x daily - 4 x weekly - 1 sets - 10 reps - 2 seconds hold - stand on foam eyes open   - 1 x daily - 4 x weekly - 1 sets - 10 reps - 2 seconds hold - Alternating Punch with Resistance  - 1 x daily - 3-4 x weekly - 1 sets - 3-5 reps - Standing Scapular Protraction with Resistance  - 1 x daily - 3-4 x weekly - 1 sets - 3-5 reps - Standing alternate rows with resistance  - 1 x daily - 3-4 x weekly - 1 sets - 3-5 reps - Standing Row with Anchored Resistance  - 1 x daily - 3-4 x weekly - 1 sets - 3-5 reps - Alternating elbow flexion with resistance  - 1 x daily - 3-4 x weekly - 1 sets - 3-5 reps - Standing Bicep Curls with Resistance  - 1 x daily - 3-4 x weekly - 1 sets - 3-5 reps    Do each exercise 5-10 repetitions Hold each exercise for 2 seconds to feel your location  AT [place].  Try to find this position when standing still for activities.   USE TAPE ON FLOOR TO MARK THE MIDLINE POSITION which is even with middle of sink.  You also should try to feel with your limb pressure in socket.  You are trying to feel with limb what you used to feel with the bottom of your foot.  Side to Side Shift: Moving your hips only (not shoulders): move weight onto your left leg, HOLD/FEEL pressure in socket.  Move back to equal weight on each leg, HOLD/FEEL pressure in socket. Move weight onto your right leg, HOLD/FEEL pressure in socket. Move back to equal weight on each leg, HOLD/FEEL  pressure in socket. Repeat.  Start with both hands on sink, progress to hand on prosthetic side only, then no hands.  Front to Back Shift: Moving your hips only (not shoulders): move your weight forward onto your toes, HOLD/FEEL pressure in socket. Move your weight back to equal Flat Foot on both legs, HOLD/FEEL  pressure in socket. Move your weight back onto your heels, HOLD/FEEL  pressure in socket. Move your weight back to equal on both legs, HOLD/FEEL  pressure in socket. Repeat.  Start with both hands on sink, progress to hand on prosthetic side only,  then no hands.  Moving Cones / Cups: With equal weight on each leg: Hold on with one hand the first time, then progress to no hand supports. Move cups from one side of sink to the other. Place cups ~2" out of your reach, progress to 10" beyond reach.  Place one hand in middle of sink and reach with other hand. Do both arms.  Then hover one hand and move cups with other hand.  Overhead/Upward Reaching: alternated reaching up to top cabinets or ceiling if no cabinets present. Keep equal weight on each leg. Start with one hand support on counter while other hand reaches and progress to no hand support with reaching.  ace one hand in middle of sink and reach with other hand. Do both arms.  Then hover one hand and move cups with other hand.  5.   Looking Over Shoulders: With equal weight on each leg: alternate turning to look over your shoulders with one hand support on counter as needed.  Start with head motions only to look in front of shoulder, then even with shoulder and progress to looking behind you. To look to side, move head /eyes, then shoulder on side looking pulls back, shift more weight to side looking and pull hip back. Place one hand in middle of sink and let go with other hand so your shoulder can pull back. Switch hands to look other way.   Then hover one hand and look over shoulder. If looking right, use left hand at sink. If looking left, use  right hand at sink. 6.  Stepping with leg that is not amputated:  Move items under cabinet out of your way. Shift your hips/pelvis so weight on prosthesis. Tighten muscles in hip on prosthetic side.  SLOWLY step other leg so front of foot is in cabinet. Then step back to floor.   Back & standing endurance HEP on 05/12/22 Stand at sink with chair back on your right & left and w/c or locked rollator behind you.  Place back of pelvis against counter with feet under your pelvis.  Rest your hands on counter beside you.  Time how long you can stand upright.  Try to increase time over days & weeks.  Stand facing w/c lean forward placing both hands in seat & stand back up straight, then lean forward hovering hands over seat Facing sink: Rotate upper body to place item on back of each chair on right & left. Side bend placing water bottle in each seat. Reach over head following hands with eyes.     ASSESSMENT:  CLINICAL IMPRESSION: PT educated pt on sweat management including use of antiperspirant which she understands.  Pt reports that she learns something new every time she comes to PT & it is helping her so much.   Pt continues to benefit from skilled PT.     OBJECTIVE IMPAIRMENTS: Abnormal gait, decreased activity tolerance, decreased balance, decreased endurance, decreased knowledge of condition, decreased knowledge of use of DME, decreased mobility, difficulty walking, decreased ROM, decreased strength, increased edema, impaired flexibility, postural dysfunction, prosthetic dependency , obesity, and pain.    ACTIVITY LIMITATIONS: carrying, lifting, bending, standing, stairs, transfers, and locomotion level   PARTICIPATION LIMITATIONS: meal prep, cleaning, laundry, driving, community activity, and church   PERSONAL FACTORS: Fitness, Time since onset of injury/illness/exacerbation, and 3+ comorbidities: see PMH  are also affecting patient's functional outcome.    REHAB POTENTIAL: Good   CLINICAL  DECISION MAKING: Evolving/moderate complexity   EVALUATION  COMPLEXITY: Moderate     GOALS: Goals reviewed with patient? Yes   SHORT TERM GOALS: Target date: 06/20/2022   Patient verbalizes how to adjust ply socks with limb volume changes. Baseline: SEE OBJECTIVE DATA Goal status: ongoing 05/28/2022 2.  Patient tolerates prosthesis >90% awake hrs /day without skin issues or limb pain <2/10 after standing. Baseline: SEE OBJECTIVE DATA Goal status: ongoing 05/28/2022   3. Berg Balance >/= 36/56. Baseline: SEE OBJECTIVE DATA Goal status:  ongoing 05/28/2022   4. Patient ambulates 90' with [facility] & prosthesis with supervision. Baseline: SEE OBJECTIVE DATA Goal status: ongoing 05/28/2022    LONG TERM GOALS: Target date: 07/17/2022   Patient demonstrates & verbalized understanding of prosthetic care to enable safe utilization of prosthesis. Baseline: SEE OBJECTIVE DATA Goal status: INITIAL   Patient tolerates prosthesis wear >90% of awake hours without skin or limb pain issues. Baseline: SEE OBJECTIVE DATA Goal status: INITIAL   Berg Balance >36/56 to indicate lower fall risk Baseline: SEE OBJECTIVE DATA Goal status: INITIAL   Patient ambulates >300' with prosthesis & LRADindependently Baseline: SEE OBJECTIVE DATA Goal status: INITIAL   Patient negotiates ramps, curbs with LRAD & stairs with single rail with prosthesis independently. Baseline: SEE OBJECTIVE DATA Goal status: INITIAL   Patient verbalizes & demonstrates understanding of HEP Baseline: SEE OBJECTIVE DATA Goal status: INITIAL   PLAN:   PT FREQUENCY: 2x/week   PT DURATION: 12 weeks   PLANNED INTERVENTIONS: Therapeutic exercises, Therapeutic activity, Neuromuscular re-education, Balance training, Gait training, Patient/Family education, Self Care, Stair training, Vestibular training, Canalith repositioning, Prosthetic training, DME instructions, and physical performance training   PLAN FOR NEXT SESSION: continue  work towards STGs, perform TUG with [facility],   work on strengthening exercises,   continue gait with [facility] for household mobility. Standing balance activities.     Jamey Reas, PT, DPT 06/09/2022, 4:22 PM

## 2022-06-11 ENCOUNTER — Ambulatory Visit (INDEPENDENT_AMBULATORY_CARE_PROVIDER_SITE_OTHER): Payer: Medicare Other | Admitting: Physical Therapy

## 2022-06-11 ENCOUNTER — Encounter: Payer: Self-pay | Admitting: Physical Therapy

## 2022-06-11 DIAGNOSIS — R2681 Unsteadiness on feet: Secondary | ICD-10-CM | POA: Diagnosis not present

## 2022-06-11 DIAGNOSIS — M6281 Muscle weakness (generalized): Secondary | ICD-10-CM | POA: Diagnosis not present

## 2022-06-11 DIAGNOSIS — R29898 Other symptoms and signs involving the musculoskeletal system: Secondary | ICD-10-CM

## 2022-06-11 DIAGNOSIS — R2689 Other abnormalities of gait and mobility: Secondary | ICD-10-CM | POA: Diagnosis not present

## 2022-06-11 DIAGNOSIS — R42 Dizziness and giddiness: Secondary | ICD-10-CM

## 2022-06-11 DIAGNOSIS — M79662 Pain in left lower leg: Secondary | ICD-10-CM

## 2022-06-11 NOTE — Therapy (Signed)
OUTPATIENT PHYSICAL THERAPY TREATMENT NOTE  Patient Name: Tammy Good MRN: [id number] DOB:21-Mar-1951, 72 y.o., female Today's Date: 06/11/2022  PCP: general provider REFERRING PROVIDER: Melony Overly, MD    END OF SESSION:   PT End of Session - 06/11/22 1512     Visit Number 15    Number of Visits 25    Date for PT Re-Evaluation 07/17/22    Authorization Type Medicare & BCBS state health PPO    Progress Note Due on Visit 20    PT Start Time 1512    PT Stop Time 1553    PT Time Calculation (min) 41 min    Equipment Utilized During Treatment Gait belt    Activity Tolerance Patient tolerated treatment well;Patient limited by pain    Behavior During Therapy WFL for tasks assessed/performed                      Past Medical History:  Diagnosis Date   CAD S/P percutaneous coronary angioplasty 08/2013   100% mRCA - PCI Integrity Resolute DES 3.0 mm x 38 mm - 3.35 mm; PTCA of RPA V 2.0 mm x 15 mm   CHF (congestive heart failure) (HCC)    Cholesteatoma    right   Diabetes mellitus type 2 in obese (HCC)    On insulin and Invokana   History of osteomyelitis L 5th Toe all 05/2019   s/p Partial Ray Amputation with partial closure; 6 wks Abx & LSFA Atherectomy/DEB PTA with Stent for focal dissection.   Hyperlipidemia with target LDL less than 70    Hypothyroidism (acquired)    Mild essential hypertension    Obesity (BMI 30-39.9) 11/17/2013   PAD (peripheral artery disease) ([place]) 05/26/2019   05/26/19: Abd AoGram- BLE runoff -> L SFA orbital atherectomy - PTA w/ DEB & Stent 6 x 40 Luttonix (for focal dissection) - patent Pop A with 3 V runoff. LEA Dopplers 01/03/2020: RABI (prev) 0.68 (0.53)/ RTBI (prev) 0.58 (0.33); LABI (prev) 0.80 (0.64), LTBI (prev) 0.64 (0.51); R mSFA ~50-74%, L mSFA 50-74%. Patent Prox SFA stent < 49% stenosis   Panhypopituitarism (HCC)    ST elevation myocardial infarction (STEMI) of inferior wall, subsequent episode of care ([place]) 08/2013    80% branch of D1, 40% mid AV groove circumflex, 100% RCA with subacute thrombus -- thrombus extending into RPA V with 100% occlusion after initial angioplasty of mid RCA ;; Post MI ECHO 6/9/'15: EF 50-55%, mild LVH with moderate HK of inferior wall, Gr1 DD, mild LA dilation; mildly reduced RV function   Past Surgical History:  Procedure Laterality Date   ABDOMINAL AORTOGRAM W/LOWER EXTREMITY N/A 05/26/2019   Procedure: ABDOMINAL AORTOGRAM W/LOWER EXTREMITY;  Surgeon: Marty Heck, MD;  Location: [place] [facility];  Service: Cardiovascular;  Laterality: N/A;   ABDOMINAL AORTOGRAM W/LOWER EXTREMITY N/A 11/15/2020   Procedure: ABDOMINAL AORTOGRAM W/LOWER EXTREMITY;  Surgeon: Marty Heck, MD;  Location: [place] [facility];  Service: Cardiovascular;  Laterality: N/A;   ABDOMINAL AORTOGRAM W/LOWER EXTREMITY N/A 04/18/2021   Procedure: ABDOMINAL AORTOGRAM W/LOWER EXTREMITY;  Surgeon: Marty Heck, MD;  Location: [place] [facility];  Service: Cardiovascular;  Laterality: N/A;   ABDOMINAL AORTOGRAM W/LOWER EXTREMITY Left 07/11/2021   Procedure: ABDOMINAL AORTOGRAM W/LOWER EXTREMITY;  Surgeon: Marty Heck, MD;  Location: [place] [facility];  Service: Cardiovascular;  Laterality: Left;   AMPUTATION Left 05/25/2019   Procedure: AMPUTATION RAY 5th;  Surgeon: Trula Slade, DPM;  Location:  [place] [facility];  Service: Podiatry;  Laterality: Left;   AMPUTATION Left 01/14/2022   Procedure: LEFT BELOW THE KNEE AMPUTATION;  Surgeon: Erle Crocker, MD;  Location: [place];  Service: Orthopedics;  Laterality: Left;  LENGTH OF SURGERY: 90 MINUTES   APPLICATION OF WOUND VAC Left 01/14/2022   Procedure: WOUND VAC PLACEMENT;  Surgeon: Erle Crocker, MD;  Location: [place];  Service: Orthopedics;  Laterality: Left;   BONE BIOPSY Left 04/24/2021   Procedure: BONE BIOPSY;  Surgeon: Landis Martins, DPM;  Location: [place];  Service: Podiatry;  Laterality: Left;  left ankle/fibula   BONE  BIOPSY Left 07/15/2021   Procedure: LEFT FOOT BONE BIOPSY;  Surgeon: Trula Slade, DPM;  Location: [place];  Service: Podiatry;  Laterality: Left;   Cardiac Event Monitor  July-August 2015   Sinus rhythm with PVCs   CHOLECYSTECTOMY     COLONOSCOPY N/A 08/31/2013   Procedure: COLONOSCOPY;  Surgeon: Juanita Craver, MD;  Location: [facility] ENDOSCOPY;  Service: Endoscopy;  Laterality: N/A;   CORONARY STENT INTERVENTION N/A 11/19/2020   Procedure: CORONARY STENT INTERVENTION;  Surgeon: Nelva Bush, MD;  Location: [place] [facility];  Service: Cardiovascular;  Laterality: N/A;   ESOPHAGOGASTRODUODENOSCOPY N/A 09/01/2013   Procedure: ESOPHAGOGASTRODUODENOSCOPY (EGD);  Surgeon: Beryle Beams, MD;  Location: [facility] ENDOSCOPY;  Service: Endoscopy;  Laterality: N/A;  bedside   ESOPHAGOGASTRODUODENOSCOPY (EGD) WITH PROPOFOL N/A 05/26/2021   Procedure: ESOPHAGOGASTRODUODENOSCOPY (EGD) WITH PROPOFOL;  Surgeon: Carol Ada, MD;  Location: [place];  Service: Gastroenterology;  Laterality: N/A;   EYE SURGERY Bilateral    bilateral cataracts   INCISION AND DRAINAGE Left 04/24/2021   Procedure: INCISION AND DRAINAGE;  Surgeon: Landis Martins, DPM;  Location: [place];  Service: Podiatry;  Laterality: Left;   IRRIGATION AND DEBRIDEMENT FOOT Left 07/15/2021   Procedure: IRRIGATION AND DEBRIDEMENT FOOT;  Surgeon: Trula Slade, DPM;  Location: [place];  Service: Podiatry;  Laterality: Left;   LEFT HEART CATH AND CORONARY ANGIOGRAPHY N/A 11/19/2020   Procedure: LEFT HEART CATH AND CORONARY ANGIOGRAPHY;  Surgeon: Nelva Bush, MD;  Location: [place] [facility];  Service: Cardiovascular;  Laterality: N/A;   LEFT HEART CATHETERIZATION WITH CORONARY ANGIOGRAM N/A 08/30/2013   Procedure: LEFT HEART CATHETERIZATION WITH CORONARY ANGIOGRAM;  Surgeon: Leonie Man, MD;  Location: [facility] CATH LAB: 100% mRCA (thrombus - extends to RPAV), 80% D1, 40% AVG Cx.   PERCUTANEOUS CORONARY STENT INTERVENTION (PCI-S)   08/30/2013   Procedure: PERCUTANEOUS CORONARY STENT INTERVENTION (PCI-S);  Surgeon: Leonie Man, MD;  Location: [facility] CATH LAB;  Integrity Resolute DES 2.0 mm x 38 mm -- 3.35 mm.; PTCA of proximal RPA V. - 3.0 mm x 15 mm balloon   PERIPHERAL VASCULAR ATHERECTOMY  05/26/2019   Procedure: PERIPHERAL VASCULAR ATHERECTOMY;  Surgeon: Marty Heck, MD;  Location: [place] [facility];  Service: Cardiovascular;;  Left SFA   PERIPHERAL VASCULAR BALLOON ANGIOPLASTY Left 07/11/2021   Procedure: PERIPHERAL VASCULAR BALLOON ANGIOPLASTY;  Surgeon: Marty Heck, MD;  Location: [place] [facility];  Service: Cardiovascular;  Laterality: Left;  PT TRUNK / AT   PERIPHERAL VASCULAR INTERVENTION  05/26/2019   Procedure: PERIPHERAL VASCULAR INTERVENTION;  Surgeon: Marty Heck, MD;  Location: [place] [facility];  Service: Cardiovascular;;  Left SFA   PERIPHERAL VASCULAR INTERVENTION Right 11/15/2020   Procedure: PERIPHERAL VASCULAR INTERVENTION;  Surgeon: Marty Heck, MD;  Location: [place] [facility];  Service: Cardiovascular;  Laterality: Right;  Superficial Femoral Artery  PERIPHERAL VASCULAR INTERVENTION Left 07/11/2021   Procedure: PERIPHERAL VASCULAR INTERVENTION;  Surgeon: Marty Heck, MD;  Location: [place] [facility];  Service: Cardiovascular;  Laterality: Left;  SFA   PITUITARY SURGERY     TEE WITHOUT CARDIOVERSION N/A 04/26/2021   Procedure: TRANSESOPHAGEAL ECHOCARDIOGRAM (TEE);  Surgeon: Buford Dresser, MD;  Location: [facility] ENDOSCOPY;  Service: Cardiovascular;  Laterality: N/A;   TRANSTHORACIC ECHOCARDIOGRAM  08/30/2013   mild LVH. EF 50-55%. Moderate HK of the entire inferior myocardium. GR 1 DD. Mild LA dilation. Mildly reduced RV function   TYMPANOMASTOIDECTOMY Right 12/28/2017   Procedure: RIGHT TYMPANOMASTOIDECTOMY;  Surgeon: Leta Baptist, MD;  Location: [place];  Service: ENT;  Laterality: Right;   Patient Active Problem List   Diagnosis  Date Noted   Nocturia more than twice per night 02/03/2022   Unilateral complete BKA, left, initial encounter ([place]) 01/17/2022   Osteomyelitis ([place]) 01/13/2022   Normocytic anemia 01/13/2022   Atrial fibrillation ([place]) 08/15/2021   Septic arthritis of foot ([place]) 08/02/2021   Medication monitoring encounter 08/02/2021   Acute on chronic systolic CHF (congestive heart failure) ([place]) 06/02/2021   Hypothyroidism 06/02/2021   Ulcer of esophagus without bleeding    Pressure injury of skin 05/31/2021   Hypokalemia 05/30/2021   Symptomatic anemia 05/29/2021   AKI (acute kidney injury) ([place]) 05/29/2021   GI bleed 05/29/2021   Liver lesion 05/29/2021   Vertigo 05/24/2021   Generalized weakness 05/24/2021   Tenosynovitis of left ankle 04/25/2021   Thrombocytosis 04/23/2021   Fatty liver 06/19/2020   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 ([place]) [id number]   Atherosclerotic heart disease of native coronary artery without angina pectoris 03/20/2020   Epigastric pain 03/20/2020   Nausea 03/20/2020   Absence of toe ([place]) 06/28/2019   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   PAD (peripheral artery disease) ([place]) 05/26/2019   Osteomyelitis of left foot ([place]) 05/24/2019   Cellulitis and abscess of foot, except toes 05/18/2019   Chronic osteomyelitis of ankle and foot ([place]) 05/18/2019   Cholesteatoma 02/24/2019   Cellulitis and abscess of toe 11/09/2018   Colon cancer screening 11/30/2017   Long term (current) use of insulin ([place]) 03/05/2017   Iron deficiency anemia 12/10/2014   Obesity (BMI 30-39.9) 11/17/2013   Diabetes mellitus, type 2 ([place])    Essential hypertension    Right thigh pain 11/01/2013   PVC's (premature ventricular contractions) 11/01/2013   Status post insertion of drug eluting coronary artery stent to [facility] emergently and PTCA to prox. PLA 09/16/2013   Hyperlipidemia associated with type 2 diabetes mellitus ([place]) 09/16/2013   Presence of  coronary angioplasty implant and graft 09/08/2013   Panhypopituitarism ([place]) 08/30/2013   Non-ST elevation (NSTEMI) myocardial infarction ([place]) 08/22/2013   CAD S/P percutaneous coronary angioplasty 08/22/2013    REFERRING DIAG: [id number] (ICD-10-CM) - Hx of BKA, left    ONSET DATE:  03/19/2022 prosthesis delivery   THERAPY DIAG:  Other abnormalities of gait and mobility  Unsteadiness on feet  Pain in left lower leg  Muscle weakness (generalized)  Other symptoms and signs involving the musculoskeletal system  Dizziness and giddiness  Rationale for Evaluation and Treatment Rehabilitation  PERTINENT HISTORY: CAD, STEMI, CHF, PAD, A-Fib, IDDM2, severe septic arthritis   PRECAUTIONS: Fall  SUBJECTIVE:  SUBJECTIVE STATEMENT:  She went to [facility] and was able to push shopping cart.    PAIN:  Are you having pain? Yes: NPRS scale:  today  0/10 and in last week 0/10 Pain location: left distal tibia, posterior knee & distal limb.  Pain description: sore Aggravating factors: walking & standing.  Relieving factors: taking prosthesis off   Back pain lumbar: 0/10 today & in last week 4/10 walking or standing but goes away with sitting.            OBJECTIVE: (objective measures completed at initial evaluation unless otherwise dated)  COGNITION: 04/21/2022:  Overall cognitive status: Within functional limits for tasks assessed   POSTURE: 04/21/2022:  rounded shoulders, forward head, flexed trunk , and weight shift right   LOWER EXTREMITY ROM:   ROM P:passive  A:active Left Eval 04/21/22:  Hip flexion WFL  Hip extension WFL  Hip abduction WFL  Hip adduction    Hip internal rotation    Hip external rotation    Knee flexion 90*  Knee extension -5*  Ankle dorsiflexion NA  Ankle  plantarflexion NA  Ankle inversion NA  Ankle eversion NA   (Blank rows = not tested)   LOWER EXTREMITY MMT:   MMT Left Eval 04/21/22  Hip flexion 4/5  Hip extension 3+/5  Hip abduction 3+/5  Hip adduction    Hip internal rotation    Hip external rotation    Knee flexion 3+/5  Knee extension 4-/5  Ankle dorsiflexion NA  Ankle plantarflexion NA  Ankle inversion NA  Ankle eversion NA  (Blank rows = not tested)   TRANSFERS: 05/26/2022:  sit to/from stand pushing off seat of chair (no armrests needed) and stabilizes without UE support.   04/21/2022:  Sit to stand: SBA requires armrests & BUEs to push up but able to stabilize without touching RW 04/21/2022:  Stand to sit: SBA requires armrests & BUEs to control descent.    GAIT: 05/28/2022: gait velocity with SBQC 1.34 ft/sec.  05/26/2022: pt amb 57' with SBQC with minA.  Pt amb >300' with RW or rollator walker safely.   04/21/2022:  Gait pattern: step to pattern, decreased step length- Right, decreased stance time- Left, decreased hip/knee flexion- Left, circumduction- Left, Left hip hike, knee flexed in stance- Left, antalgic, lateral hip instability, trunk flexed, and abducted- Left Distance walked: 36' Assistive device utilized: Environmental consultant - 2 wheeled and TTA prosthesis Level of assistance: SBA  verbal cues only with RW Comments: excessive UE weight bearing on RW   FUNCTIONAL TESTs:  05/26/2022: Timed Up & Go Standard 24.32sec; manual TUG 26.87 sec; cognitive (recipe) TUG 30.47 sec  05/22/2022:  Berg Balance 29/56   04/28/2022:  PT assessed vertigo:  [place]  - left cervical rotation sit to supine no nystagmus, right rotation no dizziness and rolling right. -right cervical rotation sit to supine no nystagmus, left rotation slight dizziness and rolling left slight dizziness. Nestor Lewandowsky To right light dizziness sit to supine & severe dizziness sitting up. To left light dizziness sit to supine & severe dizziness sitting  up.  Orthostatic Hypotension Supine no dizziness  145/70 HR 80  Standing light dizziness 119/67 HR 108 After standing 3 min  no dizziness BP 158/76 HR 112 Positive for orthostatic Hypotension   04/21/2022: Dizziness with head motions.  Berg Balance Scale: 19/56    CURRENT PROSTHETIC WEAR ASSESSMENT:  04/21/2022: Patient is independent with: prosthetic cleaning Patient is dependent with: skin check, residual limb care,  correct ply sock adjustment, proper wear schedule/adjustment, and proper weight-bearing schedule/adjustment Donning prosthesis: SBA Doffing prosthesis: Modified independence Prosthetic wear tolerance: ~14 hours total between 2x/day, 7 days/week Prosthetic weight bearing tolerance: 5 minutes with partial weight on prosthesis with limb pain 8/10 Edema: pitting Residual limb condition: no open areas but one small area distal tibia that may be internal suture working out,  shiny skin with no hair growth, redness over patella & distal tibia, no temperature issues noted. Cylindrical shape Prosthetic description: silicon liner with pin lock suspension, total contact socket with flexible inner socket, dynamic response foot/ankle       TODAY'S TREATMENT:                                                                                                                             DATE:  06/11/2022: Prosthetic Training with Transtibial Prosthesis: Patient brought picture of entrance from garage and to wash room.  She has 4 steps with last step into home.  Railing on left with entering stops at doorframe so is not able to be as of use for last step.  She has a screen door that opens towards her on the stairs swing into the right.  She reports there is a grab bar that they have put just inside the right side door frame when entering.  PT recommended moving the grab bar to the left side of the door frame to a height that is just above her elbow when she is standing on the top step.  PT also  recommending to lock screen door to an open position so it does not create a balance problem or hit the back of her legs causing a possible wound on the right leg.  PT demo and verbal cues on entering with that left rail and the gravel on the proper side.  Patient verbalized understanding and thinks this would be more useful than her current set up. PT recommended when shopping to package banks according to frozen versus cold versus nonperishable.  Then she can pace herself with how quickly items need to be moved into her home.  Patient reports she has a bag with long handles that she uses to move items into the house.  PT used a similar bag to demonstrate and verbal cues on carrying the bag close to her shoulder versus in her hand.  This keeps the weight closer to her trunk.  Patient needs to have a wide stance with prosthesis slightly forward as she lifts the bag to her shoulder.  Patient able to return demonstration understanding including difference and balance with weight in her hand versus on her shoulder.  PT demoed carrying plastic grocery bags in her elbow with her arm locked against her trunk for stability.  Patient verbalized understanding. Patient reports understanding of PT recommendations for today and thinks that will help improve her mobility.     06/09/2022: Prosthetic Training with Transtibial Prosthesis: PT spent session on education  due to fatigue (see subjective) PT educated on need to have liner off for 6 hours due to excessive moisture.  If has evening like last night or falls asleep with prosthesis on limb, then try to remove overnight. Pt verbalized understanding.  PT verbally educated on signs of sweating and need to dry limb / liner by patting. PT recommending use of antiperspirant on limb to decrease sweating especially in summer.  Pt verbalized understanding.  PT instructed in fall risk when prosthesis is off. Pt reports frequent urination over night with issues getting to  bathroom. PT recommended trying to don liner supine or side lying so gravity will not play as large of factor on bladder. Then clicking prosthesis on limb and using RW to bathroom.  Pt verbalized understanding.   06/04/2022: Prosthetic Training with Transtibial Prosthesis: Pt amb 100' X 3 with SBQC with SBA.  Stairs: 4 steps with left ascending rail similar to her home - side stepping with BUEs on rail with CGA & cues.  PT demo & verbal cues on moving [facility] along so available at top or bottom of steps.  She reports issue with top step out of her garage due to rail location.  Pt to take a picture to discuss options next session.   Therapeutic Exercise:  PT & pt discussed 4 components of well-rounded HEP / fitness program: flexibility, strength, balance & endurance. Some exercises may get more than one area.  She forgot but plans to do in next couple of days, "does her [place] cover [employer] membership?"  PT recommending to break her HEPs into groups by location.  Do 1 group M, W, F and another group T, Th, Sat. Pt verbalized understanding.  Step up & down 4" step with counter & SBQC support leading with ea LE 3 reps with PT instruction.  Pt to look into getting 4" concrete step to place on her porch near rail.     HOME EXERCISE PROGRAM: Access Code: [email or web address] URL: [email or web address] Date: 06/02/2022 Prepared by: Jamey Reas  Exercises - wide stance head motions eyes open  - 1 x daily - 4 x weekly - 1 sets - 10 reps - 2 seconds hold - Feet Apart with Eyes Closed with Head Motions  - 1 x daily - 4 x weekly - 1 sets - 10 reps - 2 seconds hold - stand on foam eyes open   - 1 x daily - 4 x weekly - 1 sets - 10 reps - 2 seconds hold - Alternating Punch with Resistance  - 1 x daily - 3-4 x weekly - 1 sets - 3-5 reps - Standing Scapular Protraction with Resistance  - 1 x daily - 3-4 x weekly - 1 sets - 3-5 reps - Standing alternate rows with resistance  - 1 x daily - 3-4 x weekly - 1  sets - 3-5 reps - Standing Row with Anchored Resistance  - 1 x daily - 3-4 x weekly - 1 sets - 3-5 reps - Alternating elbow flexion with resistance  - 1 x daily - 3-4 x weekly - 1 sets - 3-5 reps - Standing Bicep Curls with Resistance  - 1 x daily - 3-4 x weekly - 1 sets - 3-5 reps    Do each exercise 5-10 repetitions Hold each exercise for 2 seconds to feel your location  AT [place].  Try to find this position when standing still for activities.  USE TAPE ON FLOOR TO MARK THE MIDLINE POSITION which is even with middle of sink.  You also should try to feel with your limb pressure in socket.  You are trying to feel with limb what you used to feel with the bottom of your foot.  Side to Side Shift: Moving your hips only (not shoulders): move weight onto your left leg, HOLD/FEEL pressure in socket.  Move back to equal weight on each leg, HOLD/FEEL pressure in socket. Move weight onto your right leg, HOLD/FEEL pressure in socket. Move back to equal weight on each leg, HOLD/FEEL pressure in socket. Repeat.  Start with both hands on sink, progress to hand on prosthetic side only, then no hands.  Front to Back Shift: Moving your hips only (not shoulders): move your weight forward onto your toes, HOLD/FEEL pressure in socket. Move your weight back to equal Flat Foot on both legs, HOLD/FEEL  pressure in socket. Move your weight back onto your heels, HOLD/FEEL  pressure in socket. Move your weight back to equal on both legs, HOLD/FEEL  pressure in socket. Repeat.  Start with both hands on sink, progress to hand on prosthetic side only, then no hands.  Moving Cones / Cups: With equal weight on each leg: Hold on with one hand the first time, then progress to no hand supports. Move cups from one side of sink to the other. Place cups ~2" out of your reach, progress to 10" beyond reach.  Place one hand in middle of sink and reach with other  hand. Do both arms.  Then hover one hand and move cups with other hand.  Overhead/Upward Reaching: alternated reaching up to top cabinets or ceiling if no cabinets present. Keep equal weight on each leg. Start with one hand support on counter while other hand reaches and progress to no hand support with reaching.  ace one hand in middle of sink and reach with other hand. Do both arms.  Then hover one hand and move cups with other hand.  5.   Looking Over Shoulders: With equal weight on each leg: alternate turning to look over your shoulders with one hand support on counter as needed.  Start with head motions only to look in front of shoulder, then even with shoulder and progress to looking behind you. To look to side, move head /eyes, then shoulder on side looking pulls back, shift more weight to side looking and pull hip back. Place one hand in middle of sink and let go with other hand so your shoulder can pull back. Switch hands to look other way.   Then hover one hand and look over shoulder. If looking right, use left hand at sink. If looking left, use right hand at sink. 6.  Stepping with leg that is not amputated:  Move items under cabinet out of your way. Shift your hips/pelvis so weight on prosthesis. Tighten muscles in hip on prosthetic side.  SLOWLY step other leg so front of foot is in cabinet. Then step back to floor.   Back & standing endurance HEP on 05/12/22 Stand at sink with chair back on your right & left and w/c or locked rollator behind you.  Place back of pelvis against counter with feet under your pelvis.  Rest your hands on counter beside you.  Time how long you can stand upright.  Try to increase time over days & weeks.  Stand facing w/c lean forward placing both hands in seat & stand back up straight,  then lean forward hovering hands over seat Facing sink: Rotate upper body to place item on back of each chair on right & left. Side bend placing water bottle in each seat. Reach over  head following hands with eyes.     ASSESSMENT:  CLINICAL IMPRESSION: PT instructed in modifying entrance to the her home from her garage to improve safety and function.  Patient verbalizes understanding and thinks these modifications will be significant improvement. pt continues to benefit from skilled PT.     OBJECTIVE IMPAIRMENTS: Abnormal gait, decreased activity tolerance, decreased balance, decreased endurance, decreased knowledge of condition, decreased knowledge of use of DME, decreased mobility, difficulty walking, decreased ROM, decreased strength, increased edema, impaired flexibility, postural dysfunction, prosthetic dependency , obesity, and pain.    ACTIVITY LIMITATIONS: carrying, lifting, bending, standing, stairs, transfers, and locomotion level   PARTICIPATION LIMITATIONS: meal prep, cleaning, laundry, driving, community activity, and church   PERSONAL FACTORS: Fitness, Time since onset of injury/illness/exacerbation, and 3+ comorbidities: see PMH  are also affecting patient's functional outcome.    REHAB POTENTIAL: Good   CLINICAL DECISION MAKING: Evolving/moderate complexity   EVALUATION COMPLEXITY: Moderate     GOALS: Goals reviewed with patient? Yes   SHORT TERM GOALS: Target date: 06/20/2022   Patient verbalizes how to adjust ply socks with limb volume changes. Baseline: SEE OBJECTIVE DATA Goal status: ongoing 05/28/2022 2.  Patient tolerates prosthesis >90% awake hrs /day without skin issues or limb pain <2/10 after standing. Baseline: SEE OBJECTIVE DATA Goal status: ongoing 05/28/2022   3. Berg Balance >/= 36/56. Baseline: SEE OBJECTIVE DATA Goal status:  ongoing 05/28/2022   4. Patient ambulates 16' with [facility] & prosthesis with supervision. Baseline: SEE OBJECTIVE DATA Goal status: ongoing 05/28/2022    LONG TERM GOALS: Target date: 07/17/2022   Patient demonstrates & verbalized understanding of prosthetic care to enable safe utilization of  prosthesis. Baseline: SEE OBJECTIVE DATA Goal status: ongoing 06/11/2022   Patient tolerates prosthesis wear >90% of awake hours without skin or limb pain issues. Baseline: SEE OBJECTIVE DATA Goal status: ongoing 06/11/2022   Berg Balance >36/56 to indicate lower fall risk Baseline: SEE OBJECTIVE DATA Goal status: ongoing 06/11/2022   Patient ambulates >300' with prosthesis & LRADindependently Baseline: SEE OBJECTIVE DATA Goal status: ongoing 06/11/2022   Patient negotiates ramps, curbs with LRAD & stairs with single rail with prosthesis independently. Baseline: SEE OBJECTIVE DATA Goal status:  ongoing 06/11/2022   Patient verbalizes & demonstrates understanding of HEP Baseline: SEE OBJECTIVE DATA Goal status: ongoing 06/11/2022   PLAN:   PT FREQUENCY: 2x/week   PT DURATION: 12 weeks   PLANNED INTERVENTIONS: Therapeutic exercises, Therapeutic activity, Neuromuscular re-education, Balance training, Gait training, Patient/Family education, Self Care, Stair training, Vestibular training, Canalith repositioning, Prosthetic training, DME instructions, and physical performance training   PLAN FOR NEXT SESSION: check STGs next week, perform TUG with [facility],   work on strengthening exercises,   continue gait with SBQC for household mobility. Standing balance activities.     Jamey Reas, PT, DPT 06/11/2022, 4:03 PM

## 2022-06-16 ENCOUNTER — Encounter: Payer: Self-pay | Admitting: Physical Therapy

## 2022-06-16 ENCOUNTER — Ambulatory Visit (INDEPENDENT_AMBULATORY_CARE_PROVIDER_SITE_OTHER): Payer: Medicare Other | Admitting: Physical Therapy

## 2022-06-16 DIAGNOSIS — R42 Dizziness and giddiness: Secondary | ICD-10-CM

## 2022-06-16 DIAGNOSIS — M6281 Muscle weakness (generalized): Secondary | ICD-10-CM | POA: Diagnosis not present

## 2022-06-16 DIAGNOSIS — R2681 Unsteadiness on feet: Secondary | ICD-10-CM

## 2022-06-16 DIAGNOSIS — R2689 Other abnormalities of gait and mobility: Secondary | ICD-10-CM

## 2022-06-16 DIAGNOSIS — R29898 Other symptoms and signs involving the musculoskeletal system: Secondary | ICD-10-CM

## 2022-06-16 DIAGNOSIS — M79662 Pain in left lower leg: Secondary | ICD-10-CM | POA: Diagnosis not present

## 2022-06-16 NOTE — Therapy (Signed)
OUTPATIENT PHYSICAL THERAPY TREATMENT NOTE  Patient Name: Tammy Good MRN: [id number] DOB:Oct 05, 1950, 72 y.o., female Today's Date: 06/16/2022  PCP: general provider REFERRING PROVIDER: Melony Overly, MD    END OF SESSION:   PT End of Session - 06/16/22 1515     Visit Number 16    Number of Visits 25    Date for PT Re-Evaluation 07/17/22    Authorization Type Medicare & BCBS state health PPO    Progress Note Due on Visit 20    PT Start Time 1515    PT Stop Time 1553    PT Time Calculation (min) 38 min    Equipment Utilized During Treatment Gait belt    Activity Tolerance Patient tolerated treatment well;Patient limited by pain    Behavior During Therapy WFL for tasks assessed/performed                       Past Medical History:  Diagnosis Date   CAD S/P percutaneous coronary angioplasty 08/2013   100% mRCA - PCI Integrity Resolute DES 3.0 mm x 38 mm - 3.35 mm; PTCA of RPA V 2.0 mm x 15 mm   CHF (congestive heart failure) (HCC)    Cholesteatoma    right   Diabetes mellitus type 2 in obese (HCC)    On insulin and Invokana   History of osteomyelitis L 5th Toe all 05/2019   s/p Partial Ray Amputation with partial closure; 6 wks Abx & LSFA Atherectomy/DEB PTA with Stent for focal dissection.   Hyperlipidemia with target LDL less than 70    Hypothyroidism (acquired)    Mild essential hypertension    Obesity (BMI 30-39.9) 11/17/2013   PAD (peripheral artery disease) ([place]) 05/26/2019   05/26/19: Abd AoGram- BLE runoff -> L SFA orbital atherectomy - PTA w/ DEB & Stent 6 x 40 Luttonix (for focal dissection) - patent Pop A with 3 V runoff. LEA Dopplers 01/03/2020: RABI (prev) 0.68 (0.53)/ RTBI (prev) 0.58 (0.33); LABI (prev) 0.80 (0.64), LTBI (prev) 0.64 (0.51); R mSFA ~50-74%, L mSFA 50-74%. Patent Prox SFA stent < 49% stenosis   Panhypopituitarism (HCC)    ST elevation myocardial infarction (STEMI) of inferior wall, subsequent episode of care ([place]) 08/2013    80% branch of D1, 40% mid AV groove circumflex, 100% RCA with subacute thrombus -- thrombus extending into RPA V with 100% occlusion after initial angioplasty of mid RCA ;; Post MI ECHO 6/9/'15: EF 50-55%, mild LVH with moderate HK of inferior wall, Gr1 DD, mild LA dilation; mildly reduced RV function   Past Surgical History:  Procedure Laterality Date   ABDOMINAL AORTOGRAM W/LOWER EXTREMITY N/A 05/26/2019   Procedure: ABDOMINAL AORTOGRAM W/LOWER EXTREMITY;  Surgeon: Marty Heck, MD;  Location: [place] [facility];  Service: Cardiovascular;  Laterality: N/A;   ABDOMINAL AORTOGRAM W/LOWER EXTREMITY N/A 11/15/2020   Procedure: ABDOMINAL AORTOGRAM W/LOWER EXTREMITY;  Surgeon: Marty Heck, MD;  Location: [place] [facility];  Service: Cardiovascular;  Laterality: N/A;   ABDOMINAL AORTOGRAM W/LOWER EXTREMITY N/A 04/18/2021   Procedure: ABDOMINAL AORTOGRAM W/LOWER EXTREMITY;  Surgeon: Marty Heck, MD;  Location: [place] [facility];  Service: Cardiovascular;  Laterality: N/A;   ABDOMINAL AORTOGRAM W/LOWER EXTREMITY Left 07/11/2021   Procedure: ABDOMINAL AORTOGRAM W/LOWER EXTREMITY;  Surgeon: Marty Heck, MD;  Location: [place] [facility];  Service: Cardiovascular;  Laterality: Left;   AMPUTATION Left 05/25/2019   Procedure: AMPUTATION RAY 5th;  Surgeon: Trula Slade, DPM;  Location: [place];  Service: Podiatry;  Laterality: Left;   AMPUTATION Left 01/14/2022   Procedure: LEFT BELOW THE KNEE AMPUTATION;  Surgeon: Erle Crocker, MD;  Location: [place];  Service: Orthopedics;  Laterality: Left;  LENGTH OF SURGERY: 90 MINUTES   APPLICATION OF WOUND VAC Left 01/14/2022   Procedure: WOUND VAC PLACEMENT;  Surgeon: Erle Crocker, MD;  Location: [place];  Service: Orthopedics;  Laterality: Left;   BONE BIOPSY Left 04/24/2021   Procedure: BONE BIOPSY;  Surgeon: Landis Martins, DPM;  Location: [place];  Service: Podiatry;  Laterality: Left;  left ankle/fibula   BONE  BIOPSY Left 07/15/2021   Procedure: LEFT FOOT BONE BIOPSY;  Surgeon: Trula Slade, DPM;  Location: [place];  Service: Podiatry;  Laterality: Left;   Cardiac Event Monitor  July-August 2015   Sinus rhythm with PVCs   CHOLECYSTECTOMY     COLONOSCOPY N/A 08/31/2013   Procedure: COLONOSCOPY;  Surgeon: Juanita Craver, MD;  Location: [facility] ENDOSCOPY;  Service: Endoscopy;  Laterality: N/A;   CORONARY STENT INTERVENTION N/A 11/19/2020   Procedure: CORONARY STENT INTERVENTION;  Surgeon: Nelva Bush, MD;  Location: [place] [facility];  Service: Cardiovascular;  Laterality: N/A;   ESOPHAGOGASTRODUODENOSCOPY N/A 09/01/2013   Procedure: ESOPHAGOGASTRODUODENOSCOPY (EGD);  Surgeon: Beryle Beams, MD;  Location: [facility] ENDOSCOPY;  Service: Endoscopy;  Laterality: N/A;  bedside   ESOPHAGOGASTRODUODENOSCOPY (EGD) WITH PROPOFOL N/A 05/26/2021   Procedure: ESOPHAGOGASTRODUODENOSCOPY (EGD) WITH PROPOFOL;  Surgeon: Carol Ada, MD;  Location: [place];  Service: Gastroenterology;  Laterality: N/A;   EYE SURGERY Bilateral    bilateral cataracts   INCISION AND DRAINAGE Left 04/24/2021   Procedure: INCISION AND DRAINAGE;  Surgeon: Landis Martins, DPM;  Location: [place];  Service: Podiatry;  Laterality: Left;   IRRIGATION AND DEBRIDEMENT FOOT Left 07/15/2021   Procedure: IRRIGATION AND DEBRIDEMENT FOOT;  Surgeon: Trula Slade, DPM;  Location: [place];  Service: Podiatry;  Laterality: Left;   LEFT HEART CATH AND CORONARY ANGIOGRAPHY N/A 11/19/2020   Procedure: LEFT HEART CATH AND CORONARY ANGIOGRAPHY;  Surgeon: Nelva Bush, MD;  Location: [place] [facility];  Service: Cardiovascular;  Laterality: N/A;   LEFT HEART CATHETERIZATION WITH CORONARY ANGIOGRAM N/A 08/30/2013   Procedure: LEFT HEART CATHETERIZATION WITH CORONARY ANGIOGRAM;  Surgeon: Leonie Man, MD;  Location: [facility] CATH LAB: 100% mRCA (thrombus - extends to RPAV), 80% D1, 40% AVG Cx.   PERCUTANEOUS CORONARY STENT INTERVENTION (PCI-S)   08/30/2013   Procedure: PERCUTANEOUS CORONARY STENT INTERVENTION (PCI-S);  Surgeon: Leonie Man, MD;  Location: [facility] CATH LAB;  Integrity Resolute DES 2.0 mm x 38 mm -- 3.35 mm.; PTCA of proximal RPA V. - 3.0 mm x 15 mm balloon   PERIPHERAL VASCULAR ATHERECTOMY  05/26/2019   Procedure: PERIPHERAL VASCULAR ATHERECTOMY;  Surgeon: Marty Heck, MD;  Location: [place] [facility];  Service: Cardiovascular;;  Left SFA   PERIPHERAL VASCULAR BALLOON ANGIOPLASTY Left 07/11/2021   Procedure: PERIPHERAL VASCULAR BALLOON ANGIOPLASTY;  Surgeon: Marty Heck, MD;  Location: [place] [facility];  Service: Cardiovascular;  Laterality: Left;  PT TRUNK / AT   PERIPHERAL VASCULAR INTERVENTION  05/26/2019   Procedure: PERIPHERAL VASCULAR INTERVENTION;  Surgeon: Marty Heck, MD;  Location: [place] [facility];  Service: Cardiovascular;;  Left SFA   PERIPHERAL VASCULAR INTERVENTION Right 11/15/2020   Procedure: PERIPHERAL VASCULAR INTERVENTION;  Surgeon: Marty Heck, MD;  Location: [place] [facility];  Service: Cardiovascular;  Laterality: Right;  Superficial Femoral Artery  PERIPHERAL VASCULAR INTERVENTION Left 07/11/2021   Procedure: PERIPHERAL VASCULAR INTERVENTION;  Surgeon: Marty Heck, MD;  Location: [place] [facility];  Service: Cardiovascular;  Laterality: Left;  SFA   PITUITARY SURGERY     TEE WITHOUT CARDIOVERSION N/A 04/26/2021   Procedure: TRANSESOPHAGEAL ECHOCARDIOGRAM (TEE);  Surgeon: Buford Dresser, MD;  Location: [facility] ENDOSCOPY;  Service: Cardiovascular;  Laterality: N/A;   TRANSTHORACIC ECHOCARDIOGRAM  08/30/2013   mild LVH. EF 50-55%. Moderate HK of the entire inferior myocardium. GR 1 DD. Mild LA dilation. Mildly reduced RV function   TYMPANOMASTOIDECTOMY Right 12/28/2017   Procedure: RIGHT TYMPANOMASTOIDECTOMY;  Surgeon: Leta Baptist, MD;  Location: [place];  Service: ENT;  Laterality: Right;   Patient Active Problem List   Diagnosis  Date Noted   Nocturia more than twice per night 02/03/2022   Unilateral complete BKA, left, initial encounter ([place]) 01/17/2022   Osteomyelitis ([place]) 01/13/2022   Normocytic anemia 01/13/2022   Atrial fibrillation ([place]) 08/15/2021   Septic arthritis of foot ([place]) 08/02/2021   Medication monitoring encounter 08/02/2021   Acute on chronic systolic CHF (congestive heart failure) ([place]) 06/02/2021   Hypothyroidism 06/02/2021   Ulcer of esophagus without bleeding    Pressure injury of skin 05/31/2021   Hypokalemia 05/30/2021   Symptomatic anemia 05/29/2021   AKI (acute kidney injury) ([place]) 05/29/2021   GI bleed 05/29/2021   Liver lesion 05/29/2021   Vertigo 05/24/2021   Generalized weakness 05/24/2021   Tenosynovitis of left ankle 04/25/2021   Thrombocytosis 04/23/2021   Fatty liver 06/19/2020   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 ([place]) [id number]   Atherosclerotic heart disease of native coronary artery without angina pectoris 03/20/2020   Epigastric pain 03/20/2020   Nausea 03/20/2020   Absence of toe ([place]) 06/28/2019   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   PAD (peripheral artery disease) ([place]) 05/26/2019   Osteomyelitis of left foot ([place]) 05/24/2019   Cellulitis and abscess of foot, except toes 05/18/2019   Chronic osteomyelitis of ankle and foot ([place]) 05/18/2019   Cholesteatoma 02/24/2019   Cellulitis and abscess of toe 11/09/2018   Colon cancer screening 11/30/2017   Long term (current) use of insulin ([place]) 03/05/2017   Iron deficiency anemia 12/10/2014   Obesity (BMI 30-39.9) 11/17/2013   Diabetes mellitus, type 2 ([place])    Essential hypertension    Right thigh pain 11/01/2013   PVC's (premature ventricular contractions) 11/01/2013   Status post insertion of drug eluting coronary artery stent to [facility] emergently and PTCA to prox. PLA 09/16/2013   Hyperlipidemia associated with type 2 diabetes mellitus ([place]) 09/16/2013   Presence of  coronary angioplasty implant and graft 09/08/2013   Panhypopituitarism ([place]) 08/30/2013   Non-ST elevation (NSTEMI) myocardial infarction ([place]) 08/22/2013   CAD S/P percutaneous coronary angioplasty 08/22/2013    REFERRING DIAG: [id number] (ICD-10-CM) - Hx of BKA, left    ONSET DATE:  03/19/2022 prosthesis delivery   THERAPY DIAG:  Other abnormalities of gait and mobility  Unsteadiness on feet  Pain in left lower leg  Muscle weakness (generalized)  Other symptoms and signs involving the musculoskeletal system  Dizziness and giddiness  Rationale for Evaluation and Treatment Rehabilitation  PERTINENT HISTORY: CAD, STEMI, CHF, PAD, A-Fib, IDDM2, severe septic arthritis   PRECAUTIONS: Fall  SUBJECTIVE:  SUBJECTIVE STATEMENT:  She has not gotten handle at entrance door moved yet but makes sense to move it.    PAIN:  Are you having pain? Yes: NPRS scale:  today  0/10 and in last week 0/10 Pain location: left distal tibia, posterior knee & distal limb.  Pain description: sore Aggravating factors: walking & standing.  Relieving factors: taking prosthesis off   Back pain lumbar: 0/10 today & in last week 4/10 walking or standing but goes away with sitting.            OBJECTIVE: (objective measures completed at initial evaluation unless otherwise dated)  COGNITION: 04/21/2022:  Overall cognitive status: Within functional limits for tasks assessed   POSTURE: 04/21/2022:  rounded shoulders, forward head, flexed trunk , and weight shift right   LOWER EXTREMITY ROM:   ROM P:passive  A:active Left Eval 04/21/22:  Hip flexion WFL  Hip extension WFL  Hip abduction WFL  Hip adduction    Hip internal rotation    Hip external rotation    Knee flexion 90*  Knee extension -5*  Ankle dorsiflexion NA   Ankle plantarflexion NA  Ankle inversion NA  Ankle eversion NA   (Blank rows = not tested)   LOWER EXTREMITY MMT:   MMT Left Eval 04/21/22  Hip flexion 4/5  Hip extension 3+/5  Hip abduction 3+/5  Hip adduction    Hip internal rotation    Hip external rotation    Knee flexion 3+/5  Knee extension 4-/5  Ankle dorsiflexion NA  Ankle plantarflexion NA  Ankle inversion NA  Ankle eversion NA  (Blank rows = not tested)   TRANSFERS: 05/26/2022:  sit to/from stand pushing off seat of chair (no armrests needed) and stabilizes without UE support.   04/21/2022:  Sit to stand: SBA requires armrests & BUEs to push up but able to stabilize without touching RW 04/21/2022:  Stand to sit: SBA requires armrests & BUEs to control descent.    GAIT: 05/28/2022: gait velocity with SBQC 1.34 ft/sec.  05/26/2022: pt amb 20' with SBQC with minA.  Pt amb >300' with RW or rollator walker safely.   04/21/2022:  Gait pattern: step to pattern, decreased step length- Right, decreased stance time- Left, decreased hip/knee flexion- Left, circumduction- Left, Left hip hike, knee flexed in stance- Left, antalgic, lateral hip instability, trunk flexed, and abducted- Left Distance walked: 17' Assistive device utilized: Environmental consultant - 2 wheeled and TTA prosthesis Level of assistance: SBA  verbal cues only with RW Comments: excessive UE weight bearing on RW   FUNCTIONAL TESTs:  06/16/2022:  Merrilee Jansky Balance 36/56  [facility] PT Assessment - 06/16/22 1515       Standardized Balance Assessment   Standardized Balance Assessment Berg Balance Test      Berg Balance Test   Sit to Stand Able to stand  independently using hands    Standing Unsupported Able to stand safely 2 minutes    Sitting with Back Unsupported but Feet Supported on Floor or Stool Able to sit safely and securely 2 minutes    Stand to Sit Controls descent by using hands    Transfers Able to transfer safely, definite need of hands    Standing Unsupported with Eyes  Closed Able to stand 10 seconds with supervision    Standing Unsupported with Feet Together Able to place feet together independently and stand 1 minute safely    From Standing, Reach Forward with Outstretched Arm Can reach confidently >25 cm (10")  From Standing Position, Pick up Object from [place] to pick up shoe safely and easily    From Standing Position, Turn to Look Behind Over each Shoulder Turn sideways only but maintains balance    Turn 360 Degrees Needs close supervision or verbal cueing    Standing Unsupported, Alternately Place Feet on Step/Stool Needs assistance to keep from falling or unable to try    Standing Unsupported, One Foot in Front Needs help to step but can hold 15 seconds    Standing on One Leg Unable to try or needs assist to prevent fall    Total Score 36    Berg comment: BERG  < 36 high risk for falls (close to 100%) 46-51 moderate (>50%)   37-45 significant (>80%) 52-55 lower (> 25%)              05/26/2022: Timed Up & Go Standard 24.32sec; manual TUG 26.87 sec; cognitive (recipe) TUG 30.47 sec  05/22/2022:  Berg Balance 29/56  [facility] PT Assessment - 06/16/22 1515       Standardized Balance Assessment   Standardized Balance Assessment Berg Balance Test      Berg Balance Test   Sit to Stand Able to stand  independently using hands    Standing Unsupported Able to stand safely 2 minutes    Sitting with Back Unsupported but Feet Supported on Floor or Stool Able to sit safely and securely 2 minutes    Stand to Sit Controls descent by using hands    Transfers Able to transfer safely, definite need of hands    Standing Unsupported with Eyes Closed Able to stand 10 seconds with supervision    Standing Unsupported with Feet Together Able to place feet together independently and stand 1 minute safely    From Standing, Reach Forward with Outstretched Arm Can reach confidently >25 cm (10")    From Standing Position, Pick up Object from Floor Able to pick up shoe  safely and easily    From Standing Position, Turn to Look Behind Over each Shoulder Turn sideways only but maintains balance    Turn 360 Degrees Needs close supervision or verbal cueing    Standing Unsupported, Alternately Place Feet on Step/Stool Needs assistance to keep from falling or unable to try    Standing Unsupported, One Foot in Front Needs help to step but can hold 15 seconds    Standing on One Leg Unable to try or needs assist to prevent fall    Total Score 36    Berg comment: BERG  < 36 high risk for falls (close to 100%) 46-51 moderate (>50%)   37-45 significant (>80%) 52-55 lower (> 25%)             04/28/2022:  PT assessed vertigo:  Brayton Caves  - left cervical rotation sit to supine no nystagmus, right rotation no dizziness and rolling right. -right cervical rotation sit to supine no nystagmus, left rotation slight dizziness and rolling left slight dizziness. Nestor Lewandowsky To right light dizziness sit to supine & severe dizziness sitting up. To left light dizziness sit to supine & severe dizziness sitting up.  Orthostatic Hypotension Supine no dizziness  145/70 HR 80  Standing light dizziness 119/67 HR 108 After standing 3 min  no dizziness BP 158/76 HR 112 Positive for orthostatic Hypotension   04/21/2022: Dizziness with head motions.  Berg Balance Scale: 19/56    CURRENT PROSTHETIC WEAR ASSESSMENT:  04/21/2022: Patient is independent with: prosthetic cleaning Patient is  dependent with: skin check, residual limb care, correct ply sock adjustment, proper wear schedule/adjustment, and proper weight-bearing schedule/adjustment Donning prosthesis: SBA Doffing prosthesis: Modified independence Prosthetic wear tolerance: ~14 hours total between 2x/day, 7 days/week Prosthetic weight bearing tolerance: 5 minutes with partial weight on prosthesis with limb pain 8/10 Edema: pitting Residual limb condition: no open areas but one small area distal tibia that may be  internal suture working out,  shiny skin with no hair growth, redness over patella & distal tibia, no temperature issues noted. Cylindrical shape Prosthetic description: silicon liner with pin lock suspension, total contact socket with flexible inner socket, dynamic response foot/ankle       TODAY'S TREATMENT:                                                                                                                             DATE:  06/16/2022: Prosthetic Training with Transtibial Prosthesis: Pt plans to travel by car to [place] later this week.  PT recommended stopping at least every 2 hours and RLE ankle A-Z to facilitate blood flow and lower risk of clot. PT recommended creating a packing list of all items associated with prosthesis to ensure she does not forget something at home or leave it in [place].  If she travels by plane in future, PT advised TSA requirements, not removing prosthesis in fight and ask for shower with seat at hotels. Pt verbalized understanding.  Pt amb 100' X 2 with SBQC carrying open bottle of water with supervision.  Side stepping up & down with BUEs on rail leading down LLE & up RLE 3 reps.  PT noted improvement in strength.   PT instructed in fall risk when prosthesis is off.  If sponge bathing, then wash limb first and donne prosthesis to aide with balance.  Pt verbalized understanding of adjusting ply socks.     06/11/2022: Prosthetic Training with Transtibial Prosthesis: Patient brought picture of entrance from garage and to wash room.  She has 4 steps with last step into home.  Railing on left with entering stops at doorframe so is not able to be as of use for last step.  She has a screen door that opens towards her on the stairs swing into the right.  She reports there is a grab bar that they have put just inside the right side door frame when entering.  PT recommended moving the grab bar to the left side of the door frame to a height that is just above her elbow when  she is standing on the top step.  PT also recommending to lock screen door to an open position so it does not create a balance problem or hit the back of her legs causing a possible wound on the right leg.  PT demo and verbal cues on entering with that left rail and the gravel on the proper side.  Patient verbalized understanding and thinks this would be more useful than her current  set up. PT recommended when shopping to package banks according to frozen versus cold versus nonperishable.  Then she can pace herself with how quickly items need to be moved into her home.  Patient reports she has a bag with long handles that she uses to move items into the house.  PT used a similar bag to demonstrate and verbal cues on carrying the bag close to her shoulder versus in her hand.  This keeps the weight closer to her trunk.  Patient needs to have a wide stance with prosthesis slightly forward as she lifts the bag to her shoulder.  Patient able to return demonstration understanding including difference and balance with weight in her hand versus on her shoulder.  PT demoed carrying plastic grocery bags in her elbow with her arm locked against her trunk for stability.  Patient verbalized understanding. Patient reports understanding of PT recommendations for today and thinks that will help improve her mobility.   06/09/2022: Prosthetic Training with Transtibial Prosthesis: PT spent session on education due to fatigue (see subjective) PT educated on need to have liner off for 6 hours due to excessive moisture.  If has evening like last night or falls asleep with prosthesis on limb, then try to remove overnight. Pt verbalized understanding.  PT verbally educated on signs of sweating and need to dry limb / liner by patting. PT recommending use of antiperspirant on limb to decrease sweating especially in summer.  Pt verbalized understanding.  PT instructed in fall risk when prosthesis is off. Pt reports frequent urination  over night with issues getting to bathroom. PT recommended trying to don liner supine or side lying so gravity will not play as large of factor on bladder. Then clicking prosthesis on limb and using RW to bathroom.  Pt verbalized understanding.    HOME EXERCISE PROGRAM: Access Code: [email or web address] URL: [email or web address] Date: 06/02/2022 Prepared by: Jamey Reas  Exercises - wide stance head motions eyes open  - 1 x daily - 4 x weekly - 1 sets - 10 reps - 2 seconds hold - Feet Apart with Eyes Closed with Head Motions  - 1 x daily - 4 x weekly - 1 sets - 10 reps - 2 seconds hold - stand on foam eyes open   - 1 x daily - 4 x weekly - 1 sets - 10 reps - 2 seconds hold - Alternating Punch with Resistance  - 1 x daily - 3-4 x weekly - 1 sets - 3-5 reps - Standing Scapular Protraction with Resistance  - 1 x daily - 3-4 x weekly - 1 sets - 3-5 reps - Standing alternate rows with resistance  - 1 x daily - 3-4 x weekly - 1 sets - 3-5 reps - Standing Row with Anchored Resistance  - 1 x daily - 3-4 x weekly - 1 sets - 3-5 reps - Alternating elbow flexion with resistance  - 1 x daily - 3-4 x weekly - 1 sets - 3-5 reps - Standing Bicep Curls with Resistance  - 1 x daily - 3-4 x weekly - 1 sets - 3-5 reps    Do each exercise 5-10 repetitions Hold each exercise for 2 seconds to feel your location  AT [place].  Try to find this position when standing still for activities.   USE TAPE ON FLOOR TO MARK THE MIDLINE POSITION which is even with middle of sink.  You also should try  to feel with your limb pressure in socket.  You are trying to feel with limb what you used to feel with the bottom of your foot.  Side to Side Shift: Moving your hips only (not shoulders): move weight onto your left leg, HOLD/FEEL pressure in socket.  Move back to equal weight on each leg, HOLD/FEEL pressure in socket. Move weight onto your right  leg, HOLD/FEEL pressure in socket. Move back to equal weight on each leg, HOLD/FEEL pressure in socket. Repeat.  Start with both hands on sink, progress to hand on prosthetic side only, then no hands.  Front to Back Shift: Moving your hips only (not shoulders): move your weight forward onto your toes, HOLD/FEEL pressure in socket. Move your weight back to equal Flat Foot on both legs, HOLD/FEEL  pressure in socket. Move your weight back onto your heels, HOLD/FEEL  pressure in socket. Move your weight back to equal on both legs, HOLD/FEEL  pressure in socket. Repeat.  Start with both hands on sink, progress to hand on prosthetic side only, then no hands.  Moving Cones / Cups: With equal weight on each leg: Hold on with one hand the first time, then progress to no hand supports. Move cups from one side of sink to the other. Place cups ~2" out of your reach, progress to 10" beyond reach.  Place one hand in middle of sink and reach with other hand. Do both arms.  Then hover one hand and move cups with other hand.  Overhead/Upward Reaching: alternated reaching up to top cabinets or ceiling if no cabinets present. Keep equal weight on each leg. Start with one hand support on counter while other hand reaches and progress to no hand support with reaching.  ace one hand in middle of sink and reach with other hand. Do both arms.  Then hover one hand and move cups with other hand.  5.   Looking Over Shoulders: With equal weight on each leg: alternate turning to look over your shoulders with one hand support on counter as needed.  Start with head motions only to look in front of shoulder, then even with shoulder and progress to looking behind you. To look to side, move head /eyes, then shoulder on side looking pulls back, shift more weight to side looking and pull hip back. Place one hand in middle of sink and let go with other hand so your shoulder can pull back. Switch hands to look other way.   Then hover one hand and  look over shoulder. If looking right, use left hand at sink. If looking left, use right hand at sink. 6.  Stepping with leg that is not amputated:  Move items under cabinet out of your way. Shift your hips/pelvis so weight on prosthesis. Tighten muscles in hip on prosthetic side.  SLOWLY step other leg so front of foot is in cabinet. Then step back to floor.   Back & standing endurance HEP on 05/12/22 Stand at sink with chair back on your right & left and w/c or locked rollator behind you.  Place back of pelvis against counter with feet under your pelvis.  Rest your hands on counter beside you.  Time how long you can stand upright.  Try to increase time over days & weeks.  Stand facing w/c lean forward placing both hands in seat & stand back up straight, then lean forward hovering hands over seat Facing sink: Rotate upper body to place item on back of each chair  on right & left. Side bend placing water bottle in each seat. Reach over head following hands with eyes.     ASSESSMENT:  CLINICAL IMPRESSION: Patient met all STGs including improved Berg score.  Pt continues to report increase in activities with her prosthesis.  She appears to understand travel recommendations.  pt continues to benefit from skilled PT.     OBJECTIVE IMPAIRMENTS: Abnormal gait, decreased activity tolerance, decreased balance, decreased endurance, decreased knowledge of condition, decreased knowledge of use of DME, decreased mobility, difficulty walking, decreased ROM, decreased strength, increased edema, impaired flexibility, postural dysfunction, prosthetic dependency , obesity, and pain.    ACTIVITY LIMITATIONS: carrying, lifting, bending, standing, stairs, transfers, and locomotion level   PARTICIPATION LIMITATIONS: meal prep, cleaning, laundry, driving, community activity, and church   PERSONAL FACTORS: Fitness, Time since onset of injury/illness/exacerbation, and 3+ comorbidities: see PMH  are also affecting  patient's functional outcome.    REHAB POTENTIAL: Good   CLINICAL DECISION MAKING: Evolving/moderate complexity   EVALUATION COMPLEXITY: Moderate     GOALS: Goals reviewed with patient? Yes   SHORT TERM GOALS: Target date: 06/20/2022   Patient verbalizes how to adjust ply socks with limb volume changes. Baseline: SEE OBJECTIVE DATA Goal status: MET 06/16/2022 2.  Patient tolerates prosthesis >90% awake hrs /day without skin issues or limb pain <2/10 after standing. Baseline: SEE OBJECTIVE DATA Goal status: MET 06/16/2022   3. Berg Balance >/= 36/56. Baseline: SEE OBJECTIVE DATA Goal status:  MET 06/16/2022   4. Patient ambulates 48' with [facility] & prosthesis with supervision. Baseline: SEE OBJECTIVE DATA Goal status: MET 06/16/2022    LONG TERM GOALS: Target date: 07/17/2022   Patient demonstrates & verbalized understanding of prosthetic care to enable safe utilization of prosthesis. Baseline: SEE OBJECTIVE DATA Goal status: ongoing 06/11/2022   Patient tolerates prosthesis wear >90% of awake hours without skin or limb pain issues. Baseline: SEE OBJECTIVE DATA Goal status: ongoing 06/11/2022   Berg Balance >36/56 to indicate lower fall risk Baseline: SEE OBJECTIVE DATA Goal status: ongoing 06/11/2022   Patient ambulates >300' with prosthesis & LRADindependently Baseline: SEE OBJECTIVE DATA Goal status: ongoing 06/11/2022   Patient negotiates ramps, curbs with LRAD & stairs with single rail with prosthesis independently. Baseline: SEE OBJECTIVE DATA Goal status:  ongoing 06/11/2022   Patient verbalizes & demonstrates understanding of HEP Baseline: SEE OBJECTIVE DATA Goal status: ongoing 06/11/2022   PLAN:   PT FREQUENCY: 2x/week   PT DURATION: 12 weeks   PLANNED INTERVENTIONS: Therapeutic exercises, Therapeutic activity, Neuromuscular re-education, Balance training, Gait training, Patient/Family education, Self Care, Stair training, Vestibular training, Canalith  repositioning, Prosthetic training, DME instructions, and physical performance training   PLAN FOR NEXT SESSION: work towards [place],  work on [employer],   continue gait with [facility] for household mobility. Standing balance activities.     Jamey Reas, PT, DPT 06/16/2022, 4:10 PM

## 2022-06-18 ENCOUNTER — Encounter: Payer: Medicare Other | Admitting: Physical Therapy

## 2022-06-23 ENCOUNTER — Encounter: Payer: Self-pay | Admitting: Physical Therapy

## 2022-06-23 ENCOUNTER — Ambulatory Visit (INDEPENDENT_AMBULATORY_CARE_PROVIDER_SITE_OTHER): Payer: Medicare Other | Admitting: Physical Therapy

## 2022-06-23 DIAGNOSIS — R2689 Other abnormalities of gait and mobility: Secondary | ICD-10-CM

## 2022-06-23 DIAGNOSIS — M6281 Muscle weakness (generalized): Secondary | ICD-10-CM

## 2022-06-23 DIAGNOSIS — R2681 Unsteadiness on feet: Secondary | ICD-10-CM | POA: Diagnosis not present

## 2022-06-23 DIAGNOSIS — R29898 Other symptoms and signs involving the musculoskeletal system: Secondary | ICD-10-CM

## 2022-06-23 DIAGNOSIS — M79662 Pain in left lower leg: Secondary | ICD-10-CM | POA: Diagnosis not present

## 2022-06-23 NOTE — Therapy (Signed)
OUTPATIENT PHYSICAL THERAPY TREATMENT NOTE  Patient Name: Tammy Good MRN: [id number] DOB:08/03/1950, 72 y.o., female Today's Date: 06/23/2022  PCP: general provider REFERRING PROVIDER: Melony Overly, MD    END OF SESSION:   PT End of Session - 06/23/22 1517     Visit Number 17    Number of Visits 25    Date for PT Re-Evaluation 07/17/22    Authorization Type Medicare & BCBS state health PPO    Progress Note Due on Visit 20    PT Start Time 1515    PT Stop Time 1554    PT Time Calculation (min) 39 min    Equipment Utilized During Treatment Gait belt    Activity Tolerance Patient tolerated treatment well;Patient limited by pain    Behavior During Therapy WFL for tasks assessed/performed                        Past Medical History:  Diagnosis Date   CAD S/P percutaneous coronary angioplasty 08/2013   100% mRCA - PCI Integrity Resolute DES 3.0 mm x 38 mm - 3.35 mm; PTCA of RPA V 2.0 mm x 15 mm   CHF (congestive heart failure)    Cholesteatoma    right   Diabetes mellitus type 2 in obese    On insulin and Invokana   History of osteomyelitis L 5th Toe all 05/2019   s/p Partial Ray Amputation with partial closure; 6 wks Abx & LSFA Atherectomy/DEB PTA with Stent for focal dissection.   Hyperlipidemia with target LDL less than 70    Hypothyroidism (acquired)    Mild essential hypertension    Obesity (BMI 30-39.9) 11/17/2013   PAD (peripheral artery disease) 05/26/2019   05/26/19: Abd AoGram- BLE runoff -> L SFA orbital atherectomy - PTA w/ DEB & Stent 6 x 40 Luttonix (for focal dissection) - patent Pop A with 3 V runoff. LEA Dopplers 01/03/2020: RABI (prev) 0.68 (0.53)/ RTBI (prev) 0.58 (0.33); LABI (prev) 0.80 (0.64), LTBI (prev) 0.64 (0.51); R mSFA ~50-74%, L mSFA 50-74%. Patent Prox SFA stent < 49% stenosis   Panhypopituitarism    ST elevation myocardial infarction (STEMI) of inferior wall, subsequent episode of care 08/2013   80% branch of D1, 40% mid AV  groove circumflex, 100% RCA with subacute thrombus -- thrombus extending into RPA V with 100% occlusion after initial angioplasty of mid RCA ;; Post MI ECHO 6/9/'15: EF 50-55%, mild LVH with moderate HK of inferior wall, Gr1 DD, mild LA dilation; mildly reduced RV function   Past Surgical History:  Procedure Laterality Date   ABDOMINAL AORTOGRAM W/LOWER EXTREMITY N/A 05/26/2019   Procedure: ABDOMINAL AORTOGRAM W/LOWER EXTREMITY;  Surgeon: Marty Heck, MD;  Location: [place] [facility];  Service: Cardiovascular;  Laterality: N/A;   ABDOMINAL AORTOGRAM W/LOWER EXTREMITY N/A 11/15/2020   Procedure: ABDOMINAL AORTOGRAM W/LOWER EXTREMITY;  Surgeon: Marty Heck, MD;  Location: [place] [facility];  Service: Cardiovascular;  Laterality: N/A;   ABDOMINAL AORTOGRAM W/LOWER EXTREMITY N/A 04/18/2021   Procedure: ABDOMINAL AORTOGRAM W/LOWER EXTREMITY;  Surgeon: Marty Heck, MD;  Location: [place] [facility];  Service: Cardiovascular;  Laterality: N/A;   ABDOMINAL AORTOGRAM W/LOWER EXTREMITY Left 07/11/2021   Procedure: ABDOMINAL AORTOGRAM W/LOWER EXTREMITY;  Surgeon: Marty Heck, MD;  Location: [place] [facility];  Service: Cardiovascular;  Laterality: Left;   AMPUTATION Left 05/25/2019   Procedure: AMPUTATION RAY 5th;  Surgeon: Trula Slade, DPM;  Location: [place];  Service: Podiatry;  Laterality: Left;   AMPUTATION Left 01/14/2022   Procedure: LEFT BELOW THE KNEE AMPUTATION;  Surgeon: Erle Crocker, MD;  Location: [place];  Service: Orthopedics;  Laterality: Left;  LENGTH OF SURGERY: 90 MINUTES   APPLICATION OF WOUND VAC Left 01/14/2022   Procedure: WOUND VAC PLACEMENT;  Surgeon: Erle Crocker, MD;  Location: [place];  Service: Orthopedics;  Laterality: Left;   BONE BIOPSY Left 04/24/2021   Procedure: BONE BIOPSY;  Surgeon: Landis Martins, DPM;  Location: [place];  Service: Podiatry;  Laterality: Left;  left ankle/fibula   BONE BIOPSY Left 07/15/2021    Procedure: LEFT FOOT BONE BIOPSY;  Surgeon: Trula Slade, DPM;  Location: [place];  Service: Podiatry;  Laterality: Left;   Cardiac Event Monitor  July-August 2015   Sinus rhythm with PVCs   CHOLECYSTECTOMY     COLONOSCOPY N/A 08/31/2013   Procedure: COLONOSCOPY;  Surgeon: Juanita Craver, MD;  Location[facility] END[facility];  Service: Endoscopy;  Laterality: N/A;   CORONARY STENT INTERVENTION N/A 11/19/2020   Procedure: CORONARY STENT INTERVENTION;  Surgeon: Nelva Bush, MD;  Location: [place] [facility];  Service: Cardiovascular;  Laterality: N/A;   ESOPHAGOGASTRODUODENOSCOPY N/A 09/01/2013   Procedure: ESOPHAGOGASTRODUODENOSCOPY (EGD);  Surgeon: Beryle Beams, MD;  Location: [facility] ENDOSCOPY;  Service: Endoscopy;  Laterality: N/A;  bedside   ESOPHAGOGASTRODUODENOSCOPY (EGD) WITH PROPOFOL N/A 05/26/2021   Procedure: ESOPHAGOGASTRODUODENOSCOPY (EGD) WITH PROPOFOL;  Surgeon: Carol Ada, MD;  Location: [place];  Service: Gastroenterology;  Laterality: N/A;   EYE SURGERY Bilateral    bilateral cataracts   INCISION AND DRAINAGE Left 04/24/2021   Procedure: INCISION AND DRAINAGE;  Surgeon: Landis Martins, DPM;  Location: [place];  Service: Podiatry;  Laterality: Left;   IRRIGATION AND DEBRIDEMENT FOOT Left 07/15/2021   Procedure: IRRIGATION AND DEBRIDEMENT FOOT;  Surgeon: Trula Slade, DPM;  Location: [name];  Service: Podiatry;  Laterality: Left;   LEFT HEART CATH AND CORONARY ANGIOGRAPHY N/A 11/19/2020   Procedure: LEFT HEART CATH AND CORONARY ANGIOGRAPHY;  Surgeon: Nelva Bush, MD;  Location: [place] [facility];  Service: Cardiovascular;  Laterality: N/A;   LEFT HEART CATHETERIZATION WITH CORONARY ANGIOGRAM N/A 08/30/2013   Procedure: LEFT HEART CATHETERIZATION WITH CORONARY ANGIOGRAM;  Surgeon: Leonie Man, MD;  Location: [facility] CATH LAB: 100% mRCA (thrombus - extends to RPAV), 80% D1, 40% AVG Cx.   PERCUTANEOUS CORONARY STENT INTERVENTION (PCI-S)  08/30/2013   Procedure:  PERCUTANEOUS CORONARY STENT INTERVENTION (PCI-S);  Surgeon: Leonie Man, MD;  Location: [facility] CATH LAB;  Integrity Resolute DES 2.0 mm x 38 mm -- 3.35 mm.; PTCA of proximal RPA V. - 3.0 mm x 15 mm balloon   PERIPHERAL VASCULAR ATHERECTOMY  05/26/2019   Procedure: PERIPHERAL VASCULAR ATHERECTOMY;  Surgeon: Marty Heck, MD;  Location: [place] [facility];  Service: Cardiovascular;;  Left SFA   PERIPHERAL VASCULAR BALLOON ANGIOPLASTY Left 07/11/2021   Procedure: PERIPHERAL VASCULAR BALLOON ANGIOPLASTY;  Surgeon: Marty Heck, MD;  Location: [place] [facility];  Service: Cardiovascular;  Laterality: Left;  PT TRUNK / AT   PERIPHERAL VASCULAR INTERVENTION  05/26/2019   Procedure: PERIPHERAL VASCULAR INTERVENTION;  Surgeon: Marty Heck, MD;  Location: [place] [facility];  Service: Cardiovascular;;  Left SFA   PERIPHERAL VASCULAR INTERVENTION Right 11/15/2020   Procedure: PERIPHERAL VASCULAR INTERVENTION;  Surgeon: Marty Heck, MD;  Location: [place] [facility];  Service: Cardiovascular;  Laterality: Right;  Superficial Femoral Artery   PERIPHERAL VASCULAR INTERVENTION  Left 07/11/2021   Procedure: PERIPHERAL VASCULAR INTERVENTION;  Surgeon: Marty Heck, MD;  Location: [place] [facility];  Service: Cardiovascular;  Laterality: Left;  SFA   PITUITARY SURGERY     TEE WITHOUT CARDIOVERSION N/A 04/26/2021   Procedure: TRANSESOPHAGEAL ECHOCARDIOGRAM (TEE);  Surgeon: Buford Dresser, MD;  Location: [facility] ENDOSCOPY;  Service: Cardiovascular;  Laterality: N/A;   TRANSTHORACIC ECHOCARDIOGRAM  08/30/2013   mild LVH. EF 50-55%. Moderate HK of the entire inferior myocardium. GR 1 DD. Mild LA dilation. Mildly reduced RV function   TYMPANOMASTOIDECTOMY Right 12/28/2017   Procedure: RIGHT TYMPANOMASTOIDECTOMY;  Surgeon: Leta Baptist, MD;  Location: [place];  Service: ENT;  Laterality: Right;   Patient Active Problem List   Diagnosis Date Noted   Nocturia  more than twice per night 02/03/2022   Unilateral complete BKA, left, initial encounter 01/17/2022   Osteomyelitis 01/13/2022   Normocytic anemia 01/13/2022   Atrial fibrillation 08/15/2021   Septic arthritis of foot 08/02/2021   Medication monitoring encounter 08/02/2021   Acute on chronic systolic CHF (congestive heart failure) 06/02/2021   Hypothyroidism 06/02/2021   Ulcer of esophagus without bleeding    Pressure injury of skin 05/31/2021   Hypokalemia 05/30/2021   Symptomatic anemia 05/29/2021   AKI (acute kidney injury) 05/29/2021   GI bleed 05/29/2021   Liver lesion 05/29/2021   Vertigo 05/24/2021   Generalized weakness 05/24/2021   Tenosynovitis of left ankle 04/25/2021   Thrombocytosis 04/23/2021   Fatty liver 06/19/2020   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 [id number]   Atherosclerotic heart disease of native coronary artery without angina pectoris 03/20/2020   Epigastric pain 03/20/2020   Nausea 03/20/2020   Absence of toe 06/28/2019   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   PAD (peripheral artery disease) 05/26/2019   Osteomyelitis of left foot 05/24/2019   Cellulitis and abscess of foot, except toes 05/18/2019   Chronic osteomyelitis of ankle and foot 05/18/2019   Cholesteatoma 02/24/2019   Cellulitis and abscess of toe 11/09/2018   Colon cancer screening 11/30/2017   Long term (current) use of insulin 03/05/2017   Iron deficiency anemia 12/10/2014   Obesity (BMI 30-39.9) 11/17/2013   Diabetes mellitus, type 2    Essential hypertension    Right thigh pain 11/01/2013   PVC's (premature ventricular contractions) 11/01/2013   Status post insertion of drug eluting coronary artery stent to [facility] emergently and PTCA to prox. PLA 09/16/2013   Hyperlipidemia associated with type 2 diabetes mellitus 09/16/2013   Presence of coronary angioplasty implant and graft 09/08/2013   Panhypopituitarism ([place]) 08/30/2013   Non-ST elevation (NSTEMI)  myocardial infarction 08/22/2013   CAD S/P percutaneous coronary angioplasty 08/22/2013    REFERRING DIAG: [id number] (ICD-10-CM) - Hx of BKA, left    ONSET DATE:  03/19/2022 prosthesis delivery   THERAPY DIAG:  Other abnormalities of gait and mobility  Unsteadiness on feet  Pain in left lower leg  Muscle weakness (generalized)  Other symptoms and signs involving the musculoskeletal system  Rationale for Evaluation and Treatment Rehabilitation  PERTINENT HISTORY: CAD, STEMI, CHF, PAD, A-Fib, IDDM2, severe septic arthritis   PRECAUTIONS: Fall  SUBJECTIVE:  SUBJECTIVE STATEMENT: She went to [place] and had no issues. She followed PT recommendations including break in car every 1.5 - 2 hours.    PAIN:  Are you having pain? Yes: NPRS scale:  today  0/10 and in last week 0/10 Pain location: left distal tibia, posterior knee & distal limb.  Pain description: sore Aggravating factors: walking & standing.  Relieving factors: taking prosthesis off   Back pain lumbar: 0/10 today & in last week 6/10 walking or standing but goes away with sitting.            OBJECTIVE: (objective measures completed at initial evaluation unless otherwise dated)  COGNITION: 04/21/2022:  Overall cognitive status: Within functional limits for tasks assessed   POSTURE: 04/21/2022:  rounded shoulders, forward head, flexed trunk , and weight shift right   LOWER EXTREMITY ROM:   ROM P:passive  A:active Left Eval 04/21/22:  Hip flexion WFL  Hip extension WFL  Hip abduction WFL  Hip adduction    Hip internal rotation    Hip external rotation    Knee flexion 90*  Knee extension -5*  Ankle dorsiflexion NA  Ankle plantarflexion NA  Ankle inversion NA  Ankle eversion NA   (Blank rows = not tested)   LOWER EXTREMITY MMT:   MMT  Left Eval 04/21/22  Hip flexion 4/5  Hip extension 3+/5  Hip abduction 3+/5  Hip adduction    Hip internal rotation    Hip external rotation    Knee flexion 3+/5  Knee extension 4-/5  Ankle dorsiflexion NA  Ankle plantarflexion NA  Ankle inversion NA  Ankle eversion NA  (Blank rows = not tested)   TRANSFERS: 05/26/2022:  sit to/from stand pushing off seat of chair (no armrests needed) and stabilizes without UE support.   04/21/2022:  Sit to stand: SBA requires armrests & BUEs to push up but able to stabilize without touching RW 04/21/2022:  Stand to sit: SBA requires armrests & BUEs to control descent.    GAIT: 05/28/2022: gait velocity with SBQC 1.34 ft/sec.  05/26/2022: pt amb 20' with SBQC with minA.  Pt amb >300' with RW or rollator walker safely.   04/21/2022:  Gait pattern: step to pattern, decreased step length- Right, decreased stance time- Left, decreased hip/knee flexion- Left, circumduction- Left, Left hip hike, knee flexed in stance- Left, antalgic, lateral hip instability, trunk flexed, and abducted- Left Distance walked: 47' Assistive device utilized: Environmental consultant - 2 wheeled and TTA prosthesis Level of assistance: SBA  verbal cues only with RW Comments: excessive UE weight bearing on RW   FUNCTIONAL TESTs:  06/16/2022:  Merrilee Jansky Balance 36/56  05/26/2022: Timed Up & Go Standard 24.32sec; manual TUG 26.87 sec; cognitive (recipe) TUG 30.47 sec  05/22/2022:  Merrilee Jansky Balance 29/56    04/28/2022:  PT assessed vertigo:  [place] Caves  - left cervical rotation sit to supine no nystagmus, right rotation no dizziness and rolling right. -right cervical rotation sit to supine no nystagmus, left rotation slight dizziness and rolling left slight dizziness. Nestor Lewandowsky To right light dizziness sit to supine & severe dizziness sitting up. To left light dizziness sit to supine & severe dizziness sitting up.  Orthostatic Hypotension Supine no dizziness  145/70 HR 80  Standing light dizziness  119/67 HR 108 After standing 3 min  no dizziness BP 158/76 HR 112 Positive for orthostatic Hypotension   04/21/2022: Dizziness with head motions.  Berg Balance Scale: 19/56    CURRENT PROSTHETIC WEAR ASSESSMENT:  04/21/2022: Patient is  independent with: prosthetic cleaning Patient is dependent with: skin check, residual limb care, correct ply sock adjustment, proper wear schedule/adjustment, and proper weight-bearing schedule/adjustment Donning prosthesis: SBA Doffing prosthesis: Modified independence Prosthetic wear tolerance: ~14 hours total between 2x/day, 7 days/week Prosthetic weight bearing tolerance: 5 minutes with partial weight on prosthesis with limb pain 8/10 Edema: pitting Residual limb condition: no open areas but one small area distal tibia that may be internal suture working out,  shiny skin with no hair growth, redness over patella & distal tibia, no temperature issues noted. Cylindrical shape Prosthetic description: silicon liner with pin lock suspension, total contact socket with flexible inner socket, dynamic response foot/ankle       TODAY'S TREATMENT:                                                                                                                             DATE:  06/23/2022:  Prosthetic Training with Transtibial Prosthesis: Patient & PT discussed her trip and problem solved how traveling by plane or staying in hotel instead of family is different Pt amb 100' X 2 with [facility] with supervision.   PT educated pt on how family can assist for some basic community if she wants to use [facility].  Hand hold assist technique her LUE to family's LUE keeping humerus aligned with her trunk. PT recommending if her back starts (not when it is really bad / spasming) or to negotiate ramps & curbs.  Pt neg ramp & curb with SBQC with HHA with cueing how to direct family to assist.  Pt verbalized general understanding.   06/16/2022: Prosthetic Training with Transtibial  Prosthesis: Pt plans to travel by car to [place] later this week.  PT recommended stopping at least every 2 hours and RLE ankle A-Z to facilitate blood flow and lower risk of clot. PT recommended creating a packing list of all items associated with prosthesis to ensure she does not forget something at home or leave it in [place].  If she travels by plane in future, PT advised TSA requirements, not removing prosthesis in fight and ask for shower with seat at hotels. Pt verbalized understanding.  Pt amb 100' X 2 with SBQC carrying open bottle of water with supervision.  Side stepping up & down with BUEs on rail leading down LLE & up RLE 3 reps.  PT noted improvement in strength.   PT instructed in fall risk when prosthesis is off.  If sponge bathing, then wash limb first and donne prosthesis to aide with balance.  Pt verbalized understanding of adjusting ply socks.     06/11/2022: Prosthetic Training with Transtibial Prosthesis: Patient brought picture of entrance from garage and to wash room.  She has 4 steps with last step into home.  Railing on left with entering stops at doorframe so is not able to be as of use for last step.  She has a screen door that opens towards her on the stairs swing  into the right.  She reports there is a grab bar that they have put just inside the right side door frame when entering.  PT recommended moving the grab bar to the left side of the door frame to a height that is just above her elbow when she is standing on the top step.  PT also recommending to lock screen door to an open position so it does not create a balance problem or hit the back of her legs causing a possible wound on the right leg.  PT demo and verbal cues on entering with that left rail and the gravel on the proper side.  Patient verbalized understanding and thinks this would be more useful than her current set up. PT recommended when shopping to package banks according to frozen versus cold versus nonperishable.   Then she can pace herself with how quickly items need to be moved into her home.  Patient reports she has a bag with long handles that she uses to move items into the house.  PT used a similar bag to demonstrate and verbal cues on carrying the bag close to her shoulder versus in her hand.  This keeps the weight closer to her trunk.  Patient needs to have a wide stance with prosthesis slightly forward as she lifts the bag to her shoulder.  Patient able to return demonstration understanding including difference and balance with weight in her hand versus on her shoulder.  PT demoed carrying plastic grocery bags in her elbow with her arm locked against her trunk for stability.  Patient verbalized understanding. Patient reports understanding of PT recommendations for today and thinks that will help improve her mobility.   HOME EXERCISE PROGRAM: Access Code: [email or web address] URL: [email or web address] Date: 06/02/2022 Prepared by: Jamey Reas  Exercises - wide stance head motions eyes open  - 1 x daily - 4 x weekly - 1 sets - 10 reps - 2 seconds hold - Feet Apart with Eyes Closed with Head Motions  - 1 x daily - 4 x weekly - 1 sets - 10 reps - 2 seconds hold - stand on foam eyes open   - 1 x daily - 4 x weekly - 1 sets - 10 reps - 2 seconds hold - Alternating Punch with Resistance  - 1 x daily - 3-4 x weekly - 1 sets - 3-5 reps - Standing Scapular Protraction with Resistance  - 1 x daily - 3-4 x weekly - 1 sets - 3-5 reps - Standing alternate rows with resistance  - 1 x daily - 3-4 x weekly - 1 sets - 3-5 reps - Standing Row with Anchored Resistance  - 1 x daily - 3-4 x weekly - 1 sets - 3-5 reps - Alternating elbow flexion with resistance  - 1 x daily - 3-4 x weekly - 1 sets - 3-5 reps - Standing Bicep Curls with Resistance  - 1 x daily - 3-4 x weekly - 1 sets - 3-5 reps    Do each exercise 5-10 repetitions Hold each exercise for 2 seconds to feel your location  AT [place].  Try to find this position when standing still for activities.   USE TAPE ON FLOOR TO MARK THE MIDLINE POSITION which is even with middle of sink.  You also should try to feel with your limb pressure in socket.  You are trying to feel with limb what you used to feel  with the bottom of your foot.  Side to Side Shift: Moving your hips only (not shoulders): move weight onto your left leg, HOLD/FEEL pressure in socket.  Move back to equal weight on each leg, HOLD/FEEL pressure in socket. Move weight onto your right leg, HOLD/FEEL pressure in socket. Move back to equal weight on each leg, HOLD/FEEL pressure in socket. Repeat.  Start with both hands on sink, progress to hand on prosthetic side only, then no hands.  Front to Back Shift: Moving your hips only (not shoulders): move your weight forward onto your toes, HOLD/FEEL pressure in socket. Move your weight back to equal Flat Foot on both legs, HOLD/FEEL  pressure in socket. Move your weight back onto your heels, HOLD/FEEL  pressure in socket. Move your weight back to equal on both legs, HOLD/FEEL  pressure in socket. Repeat.  Start with both hands on sink, progress to hand on prosthetic side only, then no hands.  Moving Cones / Cups: With equal weight on each leg: Hold on with one hand the first time, then progress to no hand supports. Move cups from one side of sink to the other. Place cups ~2" out of your reach, progress to 10" beyond reach.  Place one hand in middle of sink and reach with other hand. Do both arms.  Then hover one hand and move cups with other hand.  Overhead/Upward Reaching: alternated reaching up to top cabinets or ceiling if no cabinets present. Keep equal weight on each leg. Start with one hand support on counter while other hand reaches and progress to no hand support with reaching.  ace one hand in middle of sink and reach with other hand. Do both arms.  Then hover one hand and  move cups with other hand.  5.   Looking Over Shoulders: With equal weight on each leg: alternate turning to look over your shoulders with one hand support on counter as needed.  Start with head motions only to look in front of shoulder, then even with shoulder and progress to looking behind you. To look to side, move head /eyes, then shoulder on side looking pulls back, shift more weight to side looking and pull hip back. Place one hand in middle of sink and let go with other hand so your shoulder can pull back. Switch hands to look other way.   Then hover one hand and look over shoulder. If looking right, use left hand at sink. If looking left, use right hand at sink. 6.  Stepping with leg that is not amputated:  Move items under cabinet out of your way. Shift your hips/pelvis so weight on prosthesis. Tighten muscles in hip on prosthetic side.  SLOWLY step other leg so front of foot is in cabinet. Then step back to floor.   Back & standing endurance HEP on 05/12/22 Stand at sink with chair back on your right & left and w/c or locked rollator behind you.  Place back of pelvis against counter with feet under your pelvis.  Rest your hands on counter beside you.  Time how long you can stand upright.  Try to increase time over days & weeks.  Stand facing w/c lean forward placing both hands in seat & stand back up straight, then lean forward hovering hands over seat Facing sink: Rotate upper body to place item on back of each chair on right & left. Side bend placing water bottle in each seat. Reach over head following hands with eyes.  ASSESSMENT:  CLINICAL IMPRESSION: PT session focused on educating pt how to use [facility] with some family assist for basic community. She appears to have a general understanding.  pt continues to benefit from skilled PT.     OBJECTIVE IMPAIRMENTS: Abnormal gait, decreased activity tolerance, decreased balance, decreased endurance, decreased knowledge of condition,  decreased knowledge of use of DME, decreased mobility, difficulty walking, decreased ROM, decreased strength, increased edema, impaired flexibility, postural dysfunction, prosthetic dependency , obesity, and pain.    ACTIVITY LIMITATIONS: carrying, lifting, bending, standing, stairs, transfers, and locomotion level   PARTICIPATION LIMITATIONS: meal prep, cleaning, laundry, driving, community activity, and church   PERSONAL FACTORS: Fitness, Time since onset of injury/illness/exacerbation, and 3+ comorbidities: see PMH  are also affecting patient's functional outcome.    REHAB POTENTIAL: Good   CLINICAL DECISION MAKING: Evolving/moderate complexity   EVALUATION COMPLEXITY: Moderate     GOALS: Goals reviewed with patient? Yes   SHORT TERM GOALS: Target date: 06/20/2022   Patient verbalizes how to adjust ply socks with limb volume changes. Baseline: SEE OBJECTIVE DATA Goal status: MET 06/16/2022 2.  Patient tolerates prosthesis >90% awake hrs /day without skin issues or limb pain <2/10 after standing. Baseline: SEE OBJECTIVE DATA Goal status: MET 06/16/2022   3. Berg Balance >/= 36/56. Baseline: SEE OBJECTIVE DATA Goal status:  MET 06/16/2022   4. Patient ambulates 46' with [facility] & prosthesis with supervision. Baseline: SEE OBJECTIVE DATA Goal status: MET 06/16/2022    LONG TERM GOALS: Target date: 07/17/2022   Patient demonstrates & verbalized understanding of prosthetic care to enable safe utilization of prosthesis. Baseline: SEE OBJECTIVE DATA Goal status: ongoing 06/11/2022   Patient tolerates prosthesis wear >90% of awake hours without skin or limb pain issues. Baseline: SEE OBJECTIVE DATA Goal status: ongoing 06/11/2022   Berg Balance >36/56 to indicate lower fall risk Baseline: SEE OBJECTIVE DATA Goal status: ongoing 06/11/2022   Patient ambulates >300' with prosthesis & LRADindependently Baseline: SEE OBJECTIVE DATA Goal status: ongoing 06/11/2022   Patient negotiates  ramps, curbs with LRAD & stairs with single rail with prosthesis independently. Baseline: SEE OBJECTIVE DATA Goal status:  ongoing 06/11/2022   Patient verbalizes & demonstrates understanding of HEP Baseline: SEE OBJECTIVE DATA Goal status: ongoing 06/11/2022   PLAN:   PT FREQUENCY: 2x/week   PT DURATION: 12 weeks   PLANNED INTERVENTIONS: Therapeutic exercises, Therapeutic activity, Neuromuscular re-education, Balance training, Gait training, Patient/Family education, Self Care, Stair training, Vestibular training, Canalith repositioning, Prosthetic training, DME instructions, and physical performance training   PLAN FOR NEXT SESSION: have pt direct PT as family member for basic community activities.  work towards [employer],  work on [employer],   continue gait with [employer] for household mobility. Standing balance activities.     Jamey Reas, PT, DPT 06/23/2022, 4:02 PM

## 2022-06-25 ENCOUNTER — Ambulatory Visit (INDEPENDENT_AMBULATORY_CARE_PROVIDER_SITE_OTHER): Payer: Medicare Other | Admitting: Physical Therapy

## 2022-06-25 ENCOUNTER — Encounter: Payer: Self-pay | Admitting: Physical Therapy

## 2022-06-25 DIAGNOSIS — R2689 Other abnormalities of gait and mobility: Secondary | ICD-10-CM

## 2022-06-25 DIAGNOSIS — R2681 Unsteadiness on feet: Secondary | ICD-10-CM | POA: Diagnosis not present

## 2022-06-25 DIAGNOSIS — M6281 Muscle weakness (generalized): Secondary | ICD-10-CM

## 2022-06-25 DIAGNOSIS — M79662 Pain in left lower leg: Secondary | ICD-10-CM | POA: Diagnosis not present

## 2022-06-25 DIAGNOSIS — R29898 Other symptoms and signs involving the musculoskeletal system: Secondary | ICD-10-CM

## 2022-06-25 NOTE — Therapy (Signed)
OUTPATIENT PHYSICAL THERAPY TREATMENT NOTE  Patient Name: Tammy Good MRN: [id number] DOB:1950/06/19, 73 y.o., female Today's Date: 06/25/2022  PCP: general provider REFERRING PROVIDER: Melony Overly, MD    END OF SESSION:   PT End of Session - 06/25/22 1515     Visit Number 18    Number of Visits 25    Date for PT Re-Evaluation 07/17/22    Authorization Type Medicare & BCBS state health PPO    Progress Note Due on Visit 20    PT Start Time 1517    PT Stop Time 1556    PT Time Calculation (min) 39 min    Equipment Utilized During Treatment Gait belt    Activity Tolerance Patient tolerated treatment well;Patient limited by pain    Behavior During Therapy WFL for tasks assessed/performed                        Past Medical History:  Diagnosis Date   CAD S/P percutaneous coronary angioplasty 08/2013   100% mRCA - PCI Integrity Resolute DES 3.0 mm x 38 mm - 3.35 mm; PTCA of RPA V 2.0 mm x 15 mm   CHF (congestive heart failure)    Cholesteatoma    right   Diabetes mellitus type 2 in obese    On insulin and Invokana   History of osteomyelitis L 5th Toe all 05/2019   s/p Partial Ray Amputation with partial closure; 6 wks Abx & LSFA Atherectomy/DEB PTA with Stent for focal dissection.   Hyperlipidemia with target LDL less than 70    Hypothyroidism (acquired)    Mild essential hypertension    Obesity (BMI 30-39.9) 11/17/2013   PAD (peripheral artery disease) 05/26/2019   05/26/19: Abd AoGram- BLE runoff -> L SFA orbital atherectomy - PTA w/ DEB & Stent 6 x 40 Luttonix (for focal dissection) - patent Pop A with 3 V runoff. LEA Dopplers 01/03/2020: RABI (prev) 0.68 (0.53)/ RTBI (prev) 0.58 (0.33); LABI (prev) 0.80 (0.64), LTBI (prev) 0.64 (0.51); R mSFA ~50-74%, L mSFA 50-74%. Patent Prox SFA stent < 49% stenosis   Panhypopituitarism    ST elevation myocardial infarction (STEMI) of inferior wall, subsequent episode of care 08/2013   80% branch of D1, 40% mid AV  groove circumflex, 100% RCA with subacute thrombus -- thrombus extending into RPA V with 100% occlusion after initial angioplasty of mid RCA ;; Post MI ECHO 6/9/'15: EF 50-55%, mild LVH with moderate HK of inferior wall, Gr1 DD, mild LA dilation; mildly reduced RV function   Past Surgical History:  Procedure Laterality Date   ABDOMINAL AORTOGRAM W/LOWER EXTREMITY N/A 05/26/2019   Procedure: ABDOMINAL AORTOGRAM W/LOWER EXTREMITY;  Surgeon: Marty Heck, MD;  Location: [place] [facility];  Service: Cardiovascular;  Laterality: N/A;   ABDOMINAL AORTOGRAM W/LOWER EXTREMITY N/A 11/15/2020   Procedure: ABDOMINAL AORTOGRAM W/LOWER EXTREMITY;  Surgeon: Marty Heck, MD;  Location: [place] [facility];  Service: Cardiovascular;  Laterality: N/A;   ABDOMINAL AORTOGRAM W/LOWER EXTREMITY N/A 04/18/2021   Procedure: ABDOMINAL AORTOGRAM W/LOWER EXTREMITY;  Surgeon: Marty Heck, MD;  Location: [place] [facility];  Service: Cardiovascular;  Laterality: N/A;   ABDOMINAL AORTOGRAM W/LOWER EXTREMITY Left 07/11/2021   Procedure: ABDOMINAL AORTOGRAM W/LOWER EXTREMITY;  Surgeon: Marty Heck, MD;  Location: [place] [facility];  Service: Cardiovascular;  Laterality: Left;   AMPUTATION Left 05/25/2019   Procedure: AMPUTATION RAY 5th;  Surgeon: Trula Slade, DPM;  Location: [place];  Service: Podiatry;  Laterality: Left;   AMPUTATION Left 01/14/2022   Procedure: LEFT BELOW THE KNEE AMPUTATION;  Surgeon: Erle Crocker, MD;  Location: [place];  Service: Orthopedics;  Laterality: Left;  LENGTH OF SURGERY: 90 MINUTES   APPLICATION OF WOUND VAC Left 01/14/2022   Procedure: WOUND VAC PLACEMENT;  Surgeon: Erle Crocker, MD;  Location: [place];  Service: Orthopedics;  Laterality: Left;   BONE BIOPSY Left 04/24/2021   Procedure: BONE BIOPSY;  Surgeon: Landis Martins, DPM;  Location: [place];  Service: Podiatry;  Laterality: Left;  left ankle/fibula   BONE BIOPSY Left 07/15/2021    Procedure: LEFT FOOT BONE BIOPSY;  Surgeon: Trula Slade, DPM;  Location: [place];  Service: Podiatry;  Laterality: Left;   Cardiac Event Monitor  July-August 2015   Sinus rhythm with PVCs   CHOLECYSTECTOMY     COLONOSCOPY N/A 08/31/2013   Procedure: COLONOSCOPY;  Surgeon: Juanita Craver, MD;  Location: [facility] ENDOSCOPY;  Service: Endoscopy;  Laterality: N/A;   CORONARY STENT INTERVENTION N/A 11/19/2020   Procedure: CORONARY STENT INTERVENTION;  Surgeon: Nelva Bush, MD;  Location: [place] [facility];  Service: Cardiovascular;  Laterality: N/A;   ESOPHAGOGASTRODUODENOSCOPY N/A 09/01/2013   Procedure: ESOPHAGOGASTRODUODENOSCOPY (EGD);  Surgeon: Beryle Beams, MD;  Location: [facility];  Service: Endoscopy;  Laterality: N/A;  bedside   ESOPHAGOGASTRODUODENOSCOPY (EGD) WITH PROPOFOL N/A 05/26/2021   Procedure: ESOPHAGOGASTRODUODENOSCOPY (EGD) WITH PROPOFOL;  Surgeon: Carol Ada, MD;  Location: [place];  Service: Gastroenterology;  Laterality: N/A;   EYE SURGERY Bilateral    bilateral cataracts   INCISION AND DRAINAGE Left 04/24/2021   Procedure: INCISION AND DRAINAGE;  Surgeon: Landis Martins, DPM;  Location: [place];  Service: Podiatry;  Laterality: Left;   IRRIGATION AND DEBRIDEMENT FOOT Left 07/15/2021   Procedure: IRRIGATION AND DEBRIDEMENT FOOT;  Surgeon: Trula Slade, DPM;  Location: [place];  Service: Podiatry;  Laterality: Left;   LEFT HEART CATH AND CORONARY ANGIOGRAPHY N/A 11/19/2020   Procedure: LEFT HEART CATH AND CORONARY ANGIOGRAPHY;  Surgeon: Nelva Bush, MD;  Location: [place] [facility];  Service: Cardiovascular;  Laterality: N/A;   LEFT HEART CATHETERIZATION WITH CORONARY ANGIOGRAM N/A 08/30/2013   Procedure: LEFT HEART CATHETERIZATION WITH CORONARY ANGIOGRAM;  Surgeon: Leonie Man, MD;  Location: [facility] CATH LAB: 100% mRCA (thrombus - extends to RPAV), 80% D1, 40% AVG Cx.   PERCUTANEOUS CORONARY STENT INTERVENTION (PCI-S)  08/30/2013   Procedure:  PERCUTANEOUS CORONARY STENT INTERVENTION (PCI-S);  Surgeon: Leonie Man, MD;  Location: [facility] CATH LAB;  Integrity Resolute DES 2.0 mm x 38 mm -- 3.35 mm.; PTCA of proximal RPA V. - 3.0 mm x 15 mm balloon   PERIPHERAL VASCULAR ATHERECTOMY  05/26/2019   Procedure: PERIPHERAL VASCULAR ATHERECTOMY;  Surgeon: Marty Heck, MD;  Location: [place] [facility];  Service: Cardiovascular;;  Left SFA   PERIPHERAL VASCULAR BALLOON ANGIOPLASTY Left 07/11/2021   Procedure: PERIPHERAL VASCULAR BALLOON ANGIOPLASTY;  Surgeon: Marty Heck, MD;  Location: [place] [facility];  Service: Cardiovascular;  Laterality: Left;  PT TRUNK / AT   PERIPHERAL VASCULAR INTERVENTION  05/26/2019   Procedure: PERIPHERAL VASCULAR INTERVENTION;  Surgeon: Marty Heck, MD;  Location: [place] [facility];  Service: Cardiovascular;;  Left SFA   PERIPHERAL VASCULAR INTERVENTION Right 11/15/2020   Procedure: PERIPHERAL VASCULAR INTERVENTION;  Surgeon: Marty Heck, MD;  Location: [place] [facility];  Service: Cardiovascular;  Laterality: Right;  Superficial Femoral Artery   PERIPHERAL VASCULAR INTERVENTION  Left 07/11/2021   Procedure: PERIPHERAL VASCULAR INTERVENTION;  Surgeon: Marty Heck, MD;  Location: [place] [facility];  Service: Cardiovascular;  Laterality: Left;  SFA   PITUITARY SURGERY     TEE WITHOUT CARDIOVERSION N/A 04/26/2021   Procedure: TRANSESOPHAGEAL ECHOCARDIOGRAM (TEE);  Surgeon: Buford Dresser, MD;  Location: [facility] ENDOSCOPY;  Service: Cardiovascular;  Laterality: N/A;   TRANSTHORACIC ECHOCARDIOGRAM  08/30/2013   mild LVH. EF 50-55%. Moderate HK of the entire inferior myocardium. GR 1 DD. Mild LA dilation. Mildly reduced RV function   TYMPANOMASTOIDECTOMY Right 12/28/2017   Procedure: RIGHT TYMPANOMASTOIDECTOMY;  Surgeon: Leta Baptist, MD;  Location: [place];  Service: ENT;  Laterality: Right;   Patient Active Problem List   Diagnosis Date Noted   Nocturia  more than twice per night 02/03/2022   Unilateral complete BKA, left, initial encounter 01/17/2022   Osteomyelitis 01/13/2022   Normocytic anemia 01/13/2022   Atrial fibrillation 08/15/2021   Septic arthritis of foot 08/02/2021   Medication monitoring encounter 08/02/2021   Acute on chronic systolic CHF (congestive heart failure) 06/02/2021   Hypothyroidism 06/02/2021   Ulcer of esophagus without bleeding    Pressure injury of skin 05/31/2021   Hypokalemia 05/30/2021   Symptomatic anemia 05/29/2021   AKI (acute kidney injury) 05/29/2021   GI bleed 05/29/2021   Liver lesion 05/29/2021   Vertigo 05/24/2021   Generalized weakness 05/24/2021   Tenosynovitis of left ankle 04/25/2021   Thrombocytosis 04/23/2021   Fatty liver 06/19/2020   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 [id number]   Atherosclerotic heart disease of native coronary artery without angina pectoris 03/20/2020   Epigastric pain 03/20/2020   Nausea 03/20/2020   Absence of toe 06/28/2019   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   PAD (peripheral artery disease) 05/26/2019   Osteomyelitis of left foot 05/24/2019   Cellulitis and abscess of foot, except toes 05/18/2019   Chronic osteomyelitis of ankle and foot 05/18/2019   Cholesteatoma 02/24/2019   Cellulitis and abscess of toe 11/09/2018   Colon cancer screening 11/30/2017   Long term (current) use of insulin 03/05/2017   Iron deficiency anemia 12/10/2014   Obesity (BMI 30-39.9) 11/17/2013   Diabetes mellitus, type 2    Essential hypertension    Right thigh pain 11/01/2013   PVC's (premature ventricular contractions) 11/01/2013   Status post insertion of drug eluting coronary artery stent to [facility] emergently and PTCA to prox. PLA 09/16/2013   Hyperlipidemia associated with type 2 diabetes mellitus 09/16/2013   Presence of coronary angioplasty implant and graft 09/08/2013   Panhypopituitarism ([place]) 08/30/2013   Non-ST elevation (NSTEMI)  myocardial infarction 08/22/2013   CAD S/P percutaneous coronary angioplasty 08/22/2013    REFERRING DIAG: [id number] (ICD-10-CM) - Hx of BKA, left    ONSET DATE:  03/19/2022 prosthesis delivery   THERAPY DIAG:  Other abnormalities of gait and mobility  Unsteadiness on feet  Pain in left lower leg  Muscle weakness (generalized)  Other symptoms and signs involving the musculoskeletal system  Rationale for Evaluation and Treatment Rehabilitation  PERTINENT HISTORY: CAD, STEMI, CHF, PAD, A-Fib, IDDM2, severe septic arthritis   PRECAUTIONS: Fall  SUBJECTIVE:  SUBJECTIVE STATEMENT  She is having issues with balance in high winds.  Her family is putting second grab bar at top of garage stairs.  She is going to church for first time since amputation.     PAIN:  Are you having pain? Yes: NPRS scale:  today  0/10 and in last week 0/10 Pain location: left distal tibia, posterior knee & distal limb.  Pain description: sore Aggravating factors: walking & standing.  Relieving factors: taking prosthesis off   Back pain lumbar: 0/10 today & in last week 6/10 walking or standing but goes away with sitting.            OBJECTIVE: (objective measures completed at initial evaluation unless otherwise dated)  COGNITION: 04/21/2022:  Overall cognitive status: Within functional limits for tasks assessed   POSTURE: 04/21/2022:  rounded shoulders, forward head, flexed trunk , and weight shift right   LOWER EXTREMITY ROM:   ROM P:passive  A:active Left Eval 04/21/22:  Hip flexion WFL  Hip extension WFL  Hip abduction WFL  Hip adduction    Hip internal rotation    Hip external rotation    Knee flexion 90*  Knee extension -5*  Ankle dorsiflexion NA  Ankle plantarflexion NA  Ankle inversion NA  Ankle eversion NA    (Blank rows = not tested)   LOWER EXTREMITY MMT:   MMT Left Eval 04/21/22  Hip flexion 4/5  Hip extension 3+/5  Hip abduction 3+/5  Hip adduction    Hip internal rotation    Hip external rotation    Knee flexion 3+/5  Knee extension 4-/5  Ankle dorsiflexion NA  Ankle plantarflexion NA  Ankle inversion NA  Ankle eversion NA  (Blank rows = not tested)   TRANSFERS: 05/26/2022:  sit to/from stand pushing off seat of chair (no armrests needed) and stabilizes without UE support.   04/21/2022:  Sit to stand: SBA requires armrests & BUEs to push up but able to stabilize without touching RW 04/21/2022:  Stand to sit: SBA requires armrests & BUEs to control descent.    GAIT: 05/28/2022: gait velocity with SBQC 1.34 ft/sec.  05/26/2022: pt amb 77' with SBQC with minA.  Pt amb >300' with RW or rollator walker safely.   04/21/2022:  Gait pattern: step to pattern, decreased step length- Right, decreased stance time- Left, decreased hip/knee flexion- Left, circumduction- Left, Left hip hike, knee flexed in stance- Left, antalgic, lateral hip instability, trunk flexed, and abducted- Left Distance walked: 1' Assistive device utilized: Environmental consultant - 2 wheeled and TTA prosthesis Level of assistance: SBA  verbal cues only with RW Comments: excessive UE weight bearing on RW   FUNCTIONAL TESTs:  06/16/2022:  Merrilee Jansky Balance 36/56  05/26/2022: Timed Up & Go Standard 24.32sec; manual TUG 26.87 sec; cognitive (recipe) TUG 30.47 sec  05/22/2022:  Merrilee Jansky Balance 29/56    04/28/2022:  PT assessed vertigo:  [place] Caves  - left cervical rotation sit to supine no nystagmus, right rotation no dizziness and rolling right. -right cervical rotation sit to supine no nystagmus, left rotation slight dizziness and rolling left slight dizziness. Nestor Lewandowsky To right light dizziness sit to supine & severe dizziness sitting up. To left light dizziness sit to supine & severe dizziness sitting up.  Orthostatic  Hypotension Supine no dizziness  145/70 HR 80  Standing light dizziness 119/67 HR 108 After standing 3 min  no dizziness BP 158/76 HR 112 Positive for orthostatic Hypotension   04/21/2022: Dizziness with head motions.  Merrilee Jansky  Balance Scale: 19/56    CURRENT PROSTHETIC WEAR ASSESSMENT:  04/21/2022: Patient is independent with: prosthetic cleaning Patient is dependent with: skin check, residual limb care, correct ply sock adjustment, proper wear schedule/adjustment, and proper weight-bearing schedule/adjustment Donning prosthesis: SBA Doffing prosthesis: Modified independence Prosthetic wear tolerance: ~14 hours total between 2x/day, 7 days/week Prosthetic weight bearing tolerance: 5 minutes with partial weight on prosthesis with limb pain 8/10 Edema: pitting Residual limb condition: no open areas but one small area distal tibia that may be internal suture working out,  shiny skin with no hair growth, redness over patella & distal tibia, no temperature issues noted. Cylindrical shape Prosthetic description: silicon liner with pin lock suspension, total contact socket with flexible inner socket, dynamic response foot/ankle       TODAY'S TREATMENT:                                                                                                                             DATE:  06/25/2022: Prosthetic Training with Transtibial Prosthesis: PT had patient direct her as if she were a family member with no knowledge how to assist correctly.  Patient able to correctly direct PT how to assist her when ambulating with [facility] if she is starting to fatigue and needs extra support.  Patient ambulated 150 feet total with [facility] & HHA.  patient able to direct PT how to assist with negotiating 6 inch curb and 12* incline/ramp.  Patient perform step up and step down on 4 inch step with single rail & SBQC leading with each lower extremity 2 reps 2 sets.  Patient verbalizes how to perform this at home with a 4 inch  concrete block near railing on her porch.  Patient aware this is to develop strength in her lower extremities to improve her ability to negotiate curbs and stairs.   06/23/2022: Prosthetic Training with Transtibial Prosthesis: Patient & PT discussed her trip and problem solved how traveling by plane or staying in hotel instead of family is different Pt amb 100' X 2 with [facility] with supervision.   PT educated pt on how family can assist for some basic community if she wants to use [facility].  Hand hold assist technique her LUE to family's LUE keeping humerus aligned with her trunk. PT recommending if her back starts (not when it is really bad / spasming) or to negotiate ramps & curbs.  Pt neg ramp & curb with SBQC with HHA with cueing how to direct family to assist.  Pt verbalized general understanding.   06/16/2022: Prosthetic Training with Transtibial Prosthesis: Pt plans to travel by car to [place] later this week.  PT recommended stopping at least every 2 hours and RLE ankle A-Z to facilitate blood flow and lower risk of clot. PT recommended creating a packing list of all items associated with prosthesis to ensure she does not forget something at home or leave it in [place].  If she travels by plane in future,  PT advised TSA requirements, not removing prosthesis in fight and ask for shower with seat at hotels. Pt verbalized understanding.  Pt amb 100' X 2 with SBQC carrying open bottle of water with supervision.  Side stepping up & down with BUEs on rail leading down LLE & up RLE 3 reps.  PT noted improvement in strength.   PT instructed in fall risk when prosthesis is off.  If sponge bathing, then wash limb first and donne prosthesis to aide with balance.  Pt verbalized understanding of adjusting ply socks.    HOME EXERCISE PROGRAM: Access Code: [email or web address] URL: [email or web address] Date: 06/02/2022 Prepared by: Jamey Reas  Exercises - wide stance head motions eyes open  - 1 x daily - 4 x  weekly - 1 sets - 10 reps - 2 seconds hold - Feet Apart with Eyes Closed with Head Motions  - 1 x daily - 4 x weekly - 1 sets - 10 reps - 2 seconds hold - stand on foam eyes open   - 1 x daily - 4 x weekly - 1 sets - 10 reps - 2 seconds hold - Alternating Punch with Resistance  - 1 x daily - 3-4 x weekly - 1 sets - 3-5 reps - Standing Scapular Protraction with Resistance  - 1 x daily - 3-4 x weekly - 1 sets - 3-5 reps - Standing alternate rows with resistance  - 1 x daily - 3-4 x weekly - 1 sets - 3-5 reps - Standing Row with Anchored Resistance  - 1 x daily - 3-4 x weekly - 1 sets - 3-5 reps - Alternating elbow flexion with resistance  - 1 x daily - 3-4 x weekly - 1 sets - 3-5 reps - Standing Bicep Curls with Resistance  - 1 x daily - 3-4 x weekly - 1 sets - 3-5 reps    Do each exercise 5-10 repetitions Hold each exercise for 2 seconds to feel your location  AT [place].  Try to find this position when standing still for activities.   USE TAPE ON FLOOR TO MARK THE MIDLINE POSITION which is even with middle of sink.  You also should try to feel with your limb pressure in socket.  You are trying to feel with limb what you used to feel with the bottom of your foot.  Side to Side Shift: Moving your hips only (not shoulders): move weight onto your left leg, HOLD/FEEL pressure in socket.  Move back to equal weight on each leg, HOLD/FEEL pressure in socket. Move weight onto your right leg, HOLD/FEEL pressure in socket. Move back to equal weight on each leg, HOLD/FEEL pressure in socket. Repeat.  Start with both hands on sink, progress to hand on prosthetic side only, then no hands.  Front to Back Shift: Moving your hips only (not shoulders): move your weight forward onto your toes, HOLD/FEEL pressure in socket. Move your weight back to equal Flat Foot on both legs, HOLD/FEEL  pressure in socket. Move your weight back onto your  heels, HOLD/FEEL  pressure in socket. Move your weight back to equal on both legs, HOLD/FEEL  pressure in socket. Repeat.  Start with both hands on sink, progress to hand on prosthetic side only, then no hands.  Moving Cones / Cups: With equal weight on each leg: Hold on with one hand the first time, then progress to no hand supports. Move cups from one side of  sink to the other. Place cups ~2" out of your reach, progress to 10" beyond reach.  Place one hand in middle of sink and reach with other hand. Do both arms.  Then hover one hand and move cups with other hand.  Overhead/Upward Reaching: alternated reaching up to top cabinets or ceiling if no cabinets present. Keep equal weight on each leg. Start with one hand support on counter while other hand reaches and progress to no hand support with reaching.  ace one hand in middle of sink and reach with other hand. Do both arms.  Then hover one hand and move cups with other hand.  5.   Looking Over Shoulders: With equal weight on each leg: alternate turning to look over your shoulders with one hand support on counter as needed.  Start with head motions only to look in front of shoulder, then even with shoulder and progress to looking behind you. To look to side, move head /eyes, then shoulder on side looking pulls back, shift more weight to side looking and pull hip back. Place one hand in middle of sink and let go with other hand so your shoulder can pull back. Switch hands to look other way.   Then hover one hand and look over shoulder. If looking right, use left hand at sink. If looking left, use right hand at sink. 6.  Stepping with leg that is not amputated:  Move items under cabinet out of your way. Shift your hips/pelvis so weight on prosthesis. Tighten muscles in hip on prosthetic side.  SLOWLY step other leg so front of foot is in cabinet. Then step back to floor.   Back & standing endurance HEP on 05/12/22 Stand at sink with chair back on your right &  left and w/c or locked rollator behind you.  Place back of pelvis against counter with feet under your pelvis.  Rest your hands on counter beside you.  Time how long you can stand upright.  Try to increase time over days & weeks.  Stand facing w/c lean forward placing both hands in seat & stand back up straight, then lean forward hovering hands over seat Facing sink: Rotate upper body to place item on back of each chair on right & left. Side bend placing water bottle in each seat. Reach over head following hands with eyes.     ASSESSMENT:  CLINICAL IMPRESSION: Patient was able to correctly direct PT how to assist her for community based gait with SBQC.  This should enable her to have access with family to community outings.  Pt continues to benefit from skilled PT.     OBJECTIVE IMPAIRMENTS: Abnormal gait, decreased activity tolerance, decreased balance, decreased endurance, decreased knowledge of condition, decreased knowledge of use of DME, decreased mobility, difficulty walking, decreased ROM, decreased strength, increased edema, impaired flexibility, postural dysfunction, prosthetic dependency , obesity, and pain.    ACTIVITY LIMITATIONS: carrying, lifting, bending, standing, stairs, transfers, and locomotion level   PARTICIPATION LIMITATIONS: meal prep, cleaning, laundry, driving, community activity, and church   PERSONAL FACTORS: Fitness, Time since onset of injury/illness/exacerbation, and 3+ comorbidities: see PMH  are also affecting patient's functional outcome.    REHAB POTENTIAL: Good   CLINICAL DECISION MAKING: Evolving/moderate complexity   EVALUATION COMPLEXITY: Moderate     GOALS: Goals reviewed with patient? Yes   SHORT TERM GOALS: Target date: 06/20/2022   Patient verbalizes how to adjust ply socks with limb volume changes. Baseline: SEE OBJECTIVE DATA Goal status: MET  06/16/2022 2.  Patient tolerates prosthesis >90% awake hrs /day without skin issues or limb pain  <2/10 after standing. Baseline: SEE OBJECTIVE DATA Goal status: MET 06/16/2022   3. Berg Balance >/= 36/56. Baseline: SEE OBJECTIVE DATA Goal status:  MET 06/16/2022   4. Patient ambulates 31' with [facility] & prosthesis with supervision. Baseline: SEE OBJECTIVE DATA Goal status: MET 06/16/2022    LONG TERM GOALS: Target date: 07/17/2022   Patient demonstrates & verbalized understanding of prosthetic care to enable safe utilization of prosthesis. Baseline: SEE OBJECTIVE DATA Goal status: ongoing 06/11/2022   Patient tolerates prosthesis wear >90% of awake hours without skin or limb pain issues. Baseline: SEE OBJECTIVE DATA Goal status: ongoing 06/11/2022   Berg Balance >36/56 to indicate lower fall risk Baseline: SEE OBJECTIVE DATA Goal status: ongoing 06/11/2022   Patient ambulates >300' with prosthesis & LRADindependently Baseline: SEE OBJECTIVE DATA Goal status: ongoing 06/11/2022   Patient negotiates ramps, curbs with LRAD & stairs with single rail with prosthesis independently. Baseline: SEE OBJECTIVE DATA Goal status:  ongoing 06/11/2022   Patient verbalizes & demonstrates understanding of HEP Baseline: SEE OBJECTIVE DATA Goal status: ongoing 06/11/2022   PLAN:   PT FREQUENCY: 2x/week   PT DURATION: 12 weeks   PLANNED INTERVENTIONS: Therapeutic exercises, Therapeutic activity, Neuromuscular re-education, Balance training, Gait training, Patient/Family education, Self Care, Stair training, Vestibular training, Canalith repositioning, Prosthetic training, DME instructions, and physical performance training   PLAN FOR NEXT SESSION: check how her birthday celebration at church went including ramp.   work towards [employer],  work on [employer],   continue gait with [facility] for household mobility. Standing balance activities.     Jamey Reas, PT, DPT 06/25/2022, 5:15 PM

## 2022-06-30 ENCOUNTER — Encounter: Payer: Self-pay | Admitting: Physical Therapy

## 2022-06-30 ENCOUNTER — Ambulatory Visit (INDEPENDENT_AMBULATORY_CARE_PROVIDER_SITE_OTHER): Payer: Medicare Other | Admitting: Physical Therapy

## 2022-06-30 DIAGNOSIS — R2689 Other abnormalities of gait and mobility: Secondary | ICD-10-CM | POA: Diagnosis not present

## 2022-06-30 DIAGNOSIS — M6281 Muscle weakness (generalized): Secondary | ICD-10-CM

## 2022-06-30 DIAGNOSIS — M79662 Pain in left lower leg: Secondary | ICD-10-CM | POA: Diagnosis not present

## 2022-06-30 DIAGNOSIS — R2681 Unsteadiness on feet: Secondary | ICD-10-CM | POA: Diagnosis not present

## 2022-06-30 NOTE — Therapy (Signed)
OUTPATIENT PHYSICAL THERAPY TREATMENT NOTE  Patient Name: Tammy Good MRN: [id number] DOB:06-30-1950, 72 y.o., female Today's Date: 06/30/2022  PCP: general provider REFERRING PROVIDER: Dub Mikes, MD    END OF SESSION:   PT End of Session - 06/30/22 1522     Visit Number 19    Number of Visits 25    Date for PT Re-Evaluation 07/17/22    Authorization Type Medicare & BCBS state health PPO    Progress Note Due on Visit 20    PT Start Time 1515    PT Stop Time 1602    PT Time Calculation (min) 47 min    Equipment Utilized During Treatment Gait belt    Activity Tolerance Patient tolerated treatment well;Patient limited by pain    Behavior During Therapy WFL for tasks assessed/performed                        Past Medical History:  Diagnosis Date   CAD S/P percutaneous coronary angioplasty 08/2013   100% mRCA - PCI Integrity Resolute DES 3.0 mm x 38 mm - 3.35 mm; PTCA of RPA V 2.0 mm x 15 mm   CHF (congestive heart failure)    Cholesteatoma    right   Diabetes mellitus type 2 in obese    On insulin and Invokana   History of osteomyelitis L 5th Toe all 05/2019   s/p Partial Ray Amputation with partial closure; 6 wks Abx & LSFA Atherectomy/DEB PTA with Stent for focal dissection.   Hyperlipidemia with target LDL less than 70    Hypothyroidism (acquired)    Mild essential hypertension    Obesity (BMI 30-39.9) 11/17/2013   PAD (peripheral artery disease) 05/26/2019   05/26/19: Abd AoGram- BLE runoff -> L SFA orbital atherectomy - PTA w/ DEB & Stent 6 x 40 Luttonix (for focal dissection) - patent Pop A with 3 V runoff. LEA Dopplers 01/03/2020: RABI (prev) 0.68 (0.53)/ RTBI (prev) 0.58 (0.33); LABI (prev) 0.80 (0.64), LTBI (prev) 0.64 (0.51); R mSFA ~50-74%, L mSFA 50-74%. Patent Prox SFA stent < 49% stenosis   Panhypopituitarism    ST elevation myocardial infarction (STEMI) of inferior wall, subsequent episode of care 08/2013   80% branch of D1, 40% mid AV  groove circumflex, 100% RCA with subacute thrombus -- thrombus extending into RPA V with 100% occlusion after initial angioplasty of mid RCA ;; Post MI ECHO 6/9/'15: EF 50-55%, mild LVH with moderate HK of inferior wall, Gr1 DD, mild LA dilation; mildly reduced RV function   Past Surgical History:  Procedure Laterality Date   ABDOMINAL AORTOGRAM W/LOWER EXTREMITY N/A 05/26/2019   Procedure: ABDOMINAL AORTOGRAM W/LOWER EXTREMITY;  Surgeon: Cephus Shelling, MD;  [facility]: [facility];  Service: Cardiovascular;  Laterality: N/A;   ABDOMINAL AORTOGRAM W/LOWER EXTREMITY N/A 11/15/2020   Procedure: ABDOMINAL AORTOGRAM W/LOWER EXTREMITY;  Surgeon: Cephus Shelling, MD;  Location: [facility];  Service: Cardiovascular;  Laterality: N/A;   ABDOMINAL AORTOGRAM W/LOWER EXTREMITY N/A 04/18/2021   Procedure: ABDOMINAL AORTOGRAM W/LOWER EXTREMITY;  Surgeon: Cephus Shelling, MD;  [facility]: [facility];  Service: Cardiovascular;  Laterality: N/A;   ABDOMINAL AORTOGRAM W/LOWER EXTREMITY Left 07/11/2021   Procedure: ABDOMINAL AORTOGRAM W/LOWER EXTREMITY;  Surgeon: Cephus Shelling, MD;  [facility]: [facility];  Service: Cardiovascular;  Laterality: Left;   AMPUTATION Left 05/25/2019   Procedure: AMPUTATION RAY 5th;  Surgeon: Vivi Barrack, DPM;  Location: [facility];  Service: Podiatry;  Laterality: Left;   AMPUTATION Left 01/14/2022   Procedure: LEFT BELOW THE KNEE AMPUTATION;  Surgeon: Erle Crocker, MD;  Location: [place];  Service: Orthopedics;  Laterality: Left;  LENGTH OF SURGERY: 90 MINUTES   APPLICATION OF WOUND VAC Left 01/14/2022   Procedure: WOUND VAC PLACEMENT;  Surgeon: Erle Crocker, MD;  Location: [place];  Service: Orthopedics;  Laterality: Left;   BONE BIOPSY Left 04/24/2021   Procedure: BONE BIOPSY;  Surgeon: Landis Martins, DPM;  Location: [place];  Service: Podiatry;  Laterality: Left;  left ankle/fibula   BONE BIOPSY Left 07/15/2021    Procedure: LEFT FOOT BONE BIOPSY;  Surgeon: Trula Slade, DPM;  Location: [place];  Service: Podiatry;  Laterality: Left;   Cardiac Event Monitor  July-August 2015   Sinus rhythm with PVCs   CHOLECYSTECTOMY     COLONOSCOPY N/A 08/31/2013   Procedure: COLONOSCOPY;  Surgeon: Juanita Craver, MD;  Location: [facility] ENDOS[facility];  Service: Endoscopy;  Laterality: N/A;   CORONARY STENT INTERVENTION N/A 11/19/2020   Procedure: CORONARY STENT INTERVENTION;  Surgeon: Nelva Bush, MD;  Location: [place] CV LAB;  Service: Cardiovascular;  Laterality: N/A;   ESOPHAGOGASTRODUODENOSCOPY N/A 09/01/2013   Procedure: ESOPHAGOGASTRODUODENOSCOPY (EGD);  Surgeon: Beryle Beams, MD;  Location: [facility];  Service: Endoscopy;  Laterality: N/A;  bedside   ESOPHAGOGASTRODUODENOSCOPY (EGD) WITH PROPOFOL N/A 05/26/2021   Procedure: ESOPHAGOGASTRODUODENOSCOPY (EGD) WITH PROPOFOL;  Surgeon: Carol Ada, MD;  Location: [place];  Service: Gastroenterology;  Laterality: N/A;   EYE SURGERY Bilateral    bilateral cataracts   INCISION AND DRAINAGE Left 04/24/2021   Procedure: INCISION AND DRAINAGE;  Surgeon: Landis Martins, DPM;  Location: [place];  Service: Podiatry;  Laterality: Left;   IRRIGATION AND DEBRIDEMENT FOOT Left 07/15/2021   Procedure: IRRIGATION AND DEBRIDEMENT FOOT;  Surgeon: Trula Slade, DPM;  Location: [place];  Service: Podiatry;  Laterality: Left;   LEFT HEART CATH AND CORONARY ANGIOGRAPHY N/A 11/19/2020   Procedure: LEFT HEART CATH AND CORONARY ANGIOGRAPHY;  Surgeon: Nelva Bush, MD;  Location: [place] CV LAB;  Service: Cardiovascular;  Laterality: N/A;   LEFT HEART CATHETERIZATION WITH CORONARY ANGIOGRAM N/A 08/30/2013   Procedure: LEFT HEART CATHETERIZATION WITH CORONARY ANGIOGRAM;  Surgeon: Leonie Man, MD;  Location: [facility] CATH LAB: 100% mRCA (thrombus - extends to RPAV), 80% D1, 40% AVG Cx.   PERCUTANEOUS CORONARY STENT INTERVENTION (PCI-S)  08/30/2013   Procedure:  PERCUTANEOUS CORONARY STENT INTERVENTION (PCI-S);  Surgeon: Leonie Man, MD;  Location: [facility] CATH LAB;  Integrity Resolute DES 2.0 mm x 38 mm -- 3.35 mm.; PTCA of proximal RPA V. - 3.0 mm x 15 mm balloon   PERIPHERAL VASCULAR ATHERECTOMY  05/26/2019   Procedure: PERIPHERAL VASCULAR ATHERECTOMY;  Surgeon: Marty Heck, MD;  Location: [place] CV LAB;  Service: Cardiovascular;;  Left SFA   PERIPHERAL VASCULAR BALLOON ANGIOPLASTY Left 07/11/2021   Procedure: PERIPHERAL VASCULAR BALLOON ANGIOPLASTY;  Surgeon: Marty Heck, MD;  Location: [place] CV LAB;  Service: Cardiovascular;  Laterality: Left;  PT TRUNK / AT   PERIPHERAL VASCULAR INTERVENTION  05/26/2019   Procedure: PERIPHERAL VASCULAR INTERVENTION;  Surgeon: Marty Heck, MD;  Location: [place] CV LAB;  Service: Cardiovascular;;  Left SFA   PERIPHERAL VASCULAR INTERVENTION Right 11/15/2020   Procedure: PERIPHERAL VASCULAR INTERVENTION;  Surgeon: Marty Heck, MD;  Location: [place] CV LAB;  Service: Cardiovascular;  Laterality: Right;  Superficial Femoral Artery   PERIPHERAL VASCULAR INTERVENTION  Left 07/11/2021   Procedure: PERIPHERAL VASCULAR INTERVENTION;  Surgeon: Cephus Shelling, MD;  Location: [facility] INVAS[facility] CV LAB;  Service: Cardiovascular;  Laterality: Left;  SFA   PITUITARY SURGERY     TEE WITHOUT CARDIOVERSION N/A 04/26/2021   Procedure: TRANSESOPHAGEAL ECHOCARDIOGRAM (TEE);  Surgeon: Jodelle Red, MD;  Location: [facility] ENDOSCOPY;  Service: Cardiovascular;  Laterality: N/A;   TRANSTHORACIC ECHOCARDIOGRAM  08/30/2013   mild LVH. EF 50-55%. Moderate HK of the entire inferior myocardium. GR 1 DD. Mild LA dilation. Mildly reduced RV function   TYMPANOMASTOIDECTOMY Right 12/28/2017   Procedure: RIGHT TYMPANOMASTOIDECTOMY;  Surgeon: Newman Pies, MD;  Location: [facility];  Service: ENT;  Laterality: Right;   Patient Active Problem List   Diagnosis Date Noted   Nocturia  more than twice per night 02/03/2022   Unilateral complete BKA, left, initial encounter 01/17/2022   Osteomyelitis 01/13/2022   Normocytic anemia 01/13/2022   Atrial fibrillation 08/15/2021   Septic arthritis of foot 08/02/2021   Medication monitoring encounter 08/02/2021   Acute on chronic systolic CHF (congestive heart failure) 06/02/2021   Hypothyroidism 06/02/2021   Ulcer of esophagus without bleeding    Pressure injury of skin 05/31/2021   Hypokalemia 05/30/2021   Symptomatic anemia 05/29/2021   AKI (acute kidney injury) 05/29/2021   GI bleed 05/29/2021   Liver lesion 05/29/2021   Vertigo 05/24/2021   Generalized weakness 05/24/2021   Tenosynovitis of left ankle 04/25/2021   Thrombocytosis 04/23/2021   Fatty liver 06/19/2020   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 06/19/2020   Atherosclerotic heart disease of native coronary artery without angina pectoris 03/20/2020   Epigastric pain 03/20/2020   Nausea 03/20/2020   Absence of toe 06/28/2019   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   PAD (peripheral artery disease) 05/26/2019   Osteomyelitis of left foot 05/24/2019   Cellulitis and abscess of foot, except toes 05/18/2019   Chronic osteomyelitis of ankle and foot 05/18/2019   Cholesteatoma 02/24/2019   Cellulitis and abscess of toe 11/09/2018   Colon cancer screening 11/30/2017   Long term (current) use of insulin 03/05/2017   Iron deficiency anemia 12/10/2014   Obesity (BMI 30-39.9) 11/17/2013   Diabetes mellitus, type 2    Essential hypertension    Right thigh pain 11/01/2013   PVC's (premature ventricular contractions) 11/01/2013   Status post insertion of drug eluting coronary artery stent to [facility] emergently and PTCA to prox. PLA 09/16/2013   Hyperlipidemia associated with type 2 diabetes mellitus 09/16/2013   Presence of coronary angioplasty implant and graft 09/08/2013   Panhypopituitarism (HCC) 08/30/2013   Non-ST elevation (NSTEMI)  myocardial infarction 08/22/2013   CAD S/P percutaneous coronary angioplasty 08/22/2013    REFERRING DIAG: U98.119 (ICD-10-CM) - Hx of BKA, left    ONSET DATE:  03/19/2022 prosthesis delivery   THERAPY DIAG:  Other abnormalities of gait and mobility  Unsteadiness on feet  Pain in left lower leg  Muscle weakness (generalized)  Rationale for Evaluation and Treatment Rehabilitation  PERTINENT HISTORY: CAD, STEMI, CHF, PAD, A-Fib, IDDM2, severe septic arthritis   PRECAUTIONS: Fall  SUBJECTIVE:  SUBJECTIVE STATEMENT   she went up 4 steps with rail & SBQC forward facing to get into church and walked into her birthday party with no issues.    PAIN:  Are you having pain? Yes: NPRS scale:  today  0/10 and in last week 0/10 Pain location: left distal tibia, posterior knee & distal limb.  Pain description: sore Aggravating factors: walking & standing.  Relieving factors: taking prosthesis off   Back pain lumbar: 0/10 today & in last week 6/10 walking or standing but goes away with sitting.            OBJECTIVE: (objective measures completed at initial evaluation unless otherwise dated)  COGNITION: 04/21/2022:  Overall cognitive status: Within functional limits for tasks assessed   POSTURE: 04/21/2022:  rounded shoulders, forward head, flexed trunk , and weight shift right   LOWER EXTREMITY ROM:   ROM P:passive  A:active Left Eval 04/21/22:  Hip flexion WFL  Hip extension WFL  Hip abduction WFL  Hip adduction    Hip internal rotation    Hip external rotation    Knee flexion 90*  Knee extension -5*  Ankle dorsiflexion NA  Ankle plantarflexion NA  Ankle inversion NA  Ankle eversion NA   (Blank rows = not tested)   LOWER EXTREMITY MMT:   MMT Left Eval 04/21/22  Hip flexion 4/5  Hip  extension 3+/5  Hip abduction 3+/5  Hip adduction    Hip internal rotation    Hip external rotation    Knee flexion 3+/5  Knee extension 4-/5  Ankle dorsiflexion NA  Ankle plantarflexion NA  Ankle inversion NA  Ankle eversion NA  (Blank rows = not tested)   TRANSFERS: 05/26/2022:  sit to/from stand pushing off seat of chair (no armrests needed) and stabilizes without UE support.   04/21/2022:  Sit to stand: SBA requires armrests & BUEs to push up but able to stabilize without touching RW 04/21/2022:  Stand to sit: SBA requires armrests & BUEs to control descent.    GAIT: 05/28/2022: gait velocity with SBQC 1.34 ft/sec.  05/26/2022: pt amb 4350' with SBQC with minA.  Pt amb >300' with RW or rollator walker safely.   04/21/2022:  Gait pattern: step to pattern, decreased step length- Right, decreased stance time- Left, decreased hip/knee flexion- Left, circumduction- Left, Left hip hike, knee flexed in stance- Left, antalgic, lateral hip instability, trunk flexed, and abducted- Left Distance walked: 270' Assistive device utilized: Environmental consultantWalker - 2 wheeled and TTA prosthesis Level of assistance: SBA  verbal cues only with RW Comments: excessive UE weight bearing on RW   FUNCTIONAL TESTs:  06/16/2022:  Sharlene MottsBerg Balance 36/56  05/26/2022: Timed Up & Go Standard 24.32sec; manual TUG 26.87 sec; cognitive (recipe) TUG 30.47 sec  05/22/2022:  Sharlene MottsBerg Balance 29/56    04/28/2022:  PT assessed vertigo:  Charlesetta Ivoryix Halpike  - left cervical rotation sit to supine no nystagmus, right rotation no dizziness and rolling right. -right cervical rotation sit to supine no nystagmus, left rotation slight dizziness and rolling left slight dizziness. Austin MilesBrandt Daroff To right light dizziness sit to supine & severe dizziness sitting up. To left light dizziness sit to supine & severe dizziness sitting up.  Orthostatic Hypotension Supine no dizziness  145/70 HR 80  Standing light dizziness 119/67 HR 108 After standing 3 min  no  dizziness BP 158/76 HR 112 Positive for orthostatic Hypotension   04/21/2022: Dizziness with head motions.  Berg Balance Scale: 19/56    CURRENT PROSTHETIC WEAR  ASSESSMENT:  04/21/2022: Patient is independent with: prosthetic cleaning Patient is dependent with: skin check, residual limb care, correct ply sock adjustment, proper wear schedule/adjustment, and proper weight-bearing schedule/adjustment Donning prosthesis: SBA Doffing prosthesis: Modified independence Prosthetic wear tolerance: ~14 hours total between 2x/day, 7 days/week Prosthetic weight bearing tolerance: 5 minutes with partial weight on prosthesis with limb pain 8/10 Edema: pitting Residual limb condition: no open areas but one small area distal tibia that may be internal suture working out,  shiny skin with no hair growth, redness over patella & distal tibia, no temperature issues noted. Cylindrical shape Prosthetic description: silicon liner with pin lock suspension, total contact socket with flexible inner socket, dynamic response foot/ankle       TODAY'S TREATMENT:                                                                                                                             DATE:  06/30/2022: Prosthetic Training with Transtibial Prosthesis: PT educated pt on changing shoes with same heel height with demo & verbal cues. Pt verbalized understanding. PT educated pt on socket revision typical at 6-12 months.  She can have prosthetic cover if desires but would cover up laminated image in socket.  So she should discuss which option she prefers prior to having new socket made. 6 month would be June 27th. Pt verbalized understanding  Pt amb 75' X 2 with SBQC with SBA.  Pt neg 6" curb with [facility] & HHA with pt correctly directing PT how to assist her.     06/25/2022: Prosthetic Training with Transtibial Prosthesis: PT had patient direct her as if she were a family member with no knowledge how to assist correctly.   Patient able to correctly direct PT how to assist her when ambulating with [facility] if she is starting to fatigue and needs extra support.  Patient ambulated 150 feet total with [facility] & HHA.  patient able to direct PT how to assist with negotiating 6 inch curb and 12* incline/ramp.  Patient perform step up and step down on 4 inch step with single rail & SBQC leading with each lower extremity 2 reps 2 sets.  Patient verbalizes how to perform this at home with a 4 inch concrete block near railing on her porch.  Patient aware this is to develop strength in her lower extremities to improve her ability to negotiate curbs and stairs.   06/23/2022: Prosthetic Training with Transtibial Prosthesis: Patient & PT discussed her trip and problem solved how traveling by plane or staying in hotel instead of family is different Pt amb 100' X 2 with [facility] with supervision.   PT educated pt on how family can assist for some basic community if she wants to use [facility].  Hand hold assist technique her LUE to family's LUE keeping humerus aligned with her trunk. PT recommending if her back starts (not when it is really bad / spasming) or to negotiate ramps & curbs.  Pt neg ramp & curb with SBQC with HHA with cueing how to direct family to assist.  Pt verbalized general understanding.     HOME EXERCISE PROGRAM: Access Code: [email or web address] URL: [email or web address] Date: 06/02/2022 Prepared by: Vladimir Faster  Exercises - wide stance head motions eyes open  - 1 x daily - 4 x weekly - 1 sets - 10 reps - 2 seconds hold - Feet Apart with Eyes Closed with Head Motions  - 1 x daily - 4 x weekly - 1 sets - 10 reps - 2 seconds hold - stand on foam eyes open   - 1 x daily - 4 x weekly - 1 sets - 10 reps - 2 seconds hold - Alternating Punch with Resistance  - 1 x daily - 3-4 x weekly - 1 sets - 3-5 reps - Standing Scapular Protraction with Resistance  - 1 x daily - 3-4 x weekly - 1 sets - 3-5 reps - Standing alternate rows with  resistance  - 1 x daily - 3-4 x weekly - 1 sets - 3-5 reps - Standing Row with Anchored Resistance  - 1 x daily - 3-4 x weekly - 1 sets - 3-5 reps - Alternating elbow flexion with resistance  - 1 x daily - 3-4 x weekly - 1 sets - 3-5 reps - Standing Bicep Curls with Resistance  - 1 x daily - 3-4 x weekly - 1 sets - 3-5 reps    Do each exercise 5-10 repetitions Hold each exercise for 2 seconds to feel your location  AT SINK FIND YOUR MIDLINE POSITION AND PLACE FEET EQUAL DISTANCE FROM THE MIDLINE.  Try to find this position when standing still for activities.   USE TAPE ON FLOOR TO MARK THE MIDLINE POSITION which is even with middle of sink.  You also should try to feel with your limb pressure in socket.  You are trying to feel with limb what you used to feel with the bottom of your foot.  Side to Side Shift: Moving your hips only (not shoulders): move weight onto your left leg, HOLD/FEEL pressure in socket.  Move back to equal weight on each leg, HOLD/FEEL pressure in socket. Move weight onto your right leg, HOLD/FEEL pressure in socket. Move back to equal weight on each leg, HOLD/FEEL pressure in socket. Repeat.  Start with both hands on sink, progress to hand on prosthetic side only, then no hands.  Front to Back Shift: Moving your hips only (not shoulders): move your weight forward onto your toes, HOLD/FEEL pressure in socket. Move your weight back to equal Flat Foot on both legs, HOLD/FEEL  pressure in socket. Move your weight back onto your heels, HOLD/FEEL  pressure in socket. Move your weight back to equal on both legs, HOLD/FEEL  pressure in socket. Repeat.  Start with both hands on sink, progress to hand on prosthetic side only, then no hands.  Moving Cones / Cups: With equal weight on each leg: Hold on with one hand the first time, then progress to no hand supports. Move cups from one side of sink to the other. Place cups ~2" out of your reach, progress to 10" beyond reach.  Place one hand  in middle of sink and reach with other hand. Do both arms.  Then hover one hand and move cups with other hand.  Overhead/Upward Reaching: alternated reaching up to top cabinets or ceiling if no cabinets present. Keep equal weight on each leg. Start with one hand support on  counter while other hand reaches and progress to no hand support with reaching.  ace one hand in middle of sink and reach with other hand. Do both arms.  Then hover one hand and move cups with other hand.  5.   Looking Over Shoulders: With equal weight on each leg: alternate turning to look over your shoulders with one hand support on counter as needed.  Start with head motions only to look in front of shoulder, then even with shoulder and progress to looking behind you. To look to side, move head /eyes, then shoulder on side looking pulls back, shift more weight to side looking and pull hip back. Place one hand in middle of sink and let go with other hand so your shoulder can pull back. Switch hands to look other way.   Then hover one hand and look over shoulder. If looking right, use left hand at sink. If looking left, use right hand at sink. 6.  Stepping with leg that is not amputated:  Move items under cabinet out of your way. Shift your hips/pelvis so weight on prosthesis. Tighten muscles in hip on prosthetic side.  SLOWLY step other leg so front of foot is in cabinet. Then step back to floor.   Back & standing endurance HEP on 05/12/22 Stand at sink with chair back on your right & left and w/c or locked rollator behind you.  Place back of pelvis against counter with feet under your pelvis.  Rest your hands on counter beside you.  Time how long you can stand upright.  Try to increase time over days & weeks.  Stand facing w/c lean forward placing both hands in seat & stand back up straight, then lean forward hovering hands over seat Facing sink: Rotate upper body to place item on back of each chair on right & left. Side bend placing  water bottle in each seat. Reach over head following hands with eyes.     ASSESSMENT:  CLINICAL IMPRESSION: Patient's gait has improved enough that she was able to access church including stairs.  She appears to understand changing shoes & socket revision somewhere after June 27th.    Pt continues to benefit from skilled PT.     OBJECTIVE IMPAIRMENTS: Abnormal gait, decreased activity tolerance, decreased balance, decreased endurance, decreased knowledge of condition, decreased knowledge of use of DME, decreased mobility, difficulty walking, decreased ROM, decreased strength, increased edema, impaired flexibility, postural dysfunction, prosthetic dependency , obesity, and pain.    ACTIVITY LIMITATIONS: carrying, lifting, bending, standing, stairs, transfers, and locomotion level   PARTICIPATION LIMITATIONS: meal prep, cleaning, laundry, driving, community activity, and church   PERSONAL FACTORS: Fitness, Time since onset of injury/illness/exacerbation, and 3+ comorbidities: see PMH  are also affecting patient's functional outcome.    REHAB POTENTIAL: Good   CLINICAL DECISION MAKING: Evolving/moderate complexity   EVALUATION COMPLEXITY: Moderate     GOALS: Goals reviewed with patient? Yes   SHORT TERM GOALS: Target date: 06/20/2022   Patient verbalizes how to adjust ply socks with limb volume changes. Baseline: SEE OBJECTIVE DATA Goal status: MET 06/16/2022 2.  Patient tolerates prosthesis >90% awake hrs /day without skin issues or limb pain <2/10 after standing. Baseline: SEE OBJECTIVE DATA Goal status: MET 06/16/2022   3. Berg Balance >/= 36/56. Baseline: SEE OBJECTIVE DATA Goal status:  MET 06/16/2022   4. Patient ambulates 55' with [facility] & prosthesis with supervision. Baseline: SEE OBJECTIVE DATA Goal status: MET 06/16/2022    LONG TERM GOALS: Target  date: 07/17/2022   Patient demonstrates & verbalized understanding of prosthetic care to enable safe utilization of  prosthesis. Baseline: SEE OBJECTIVE DATA Goal status: ongoing 06/11/2022   Patient tolerates prosthesis wear >90% of awake hours without skin or limb pain issues. Baseline: SEE OBJECTIVE DATA Goal status: ongoing 06/11/2022   Berg Balance >36/56 to indicate lower fall risk Baseline: SEE OBJECTIVE DATA Goal status: ongoing 06/11/2022   Patient ambulates >300' with prosthesis & LRADindependently Baseline: SEE OBJECTIVE DATA Goal status: ongoing 06/11/2022   Patient negotiates ramps, curbs with LRAD & stairs with single rail with prosthesis independently. Baseline: SEE OBJECTIVE DATA Goal status:  ongoing 06/11/2022   Patient verbalizes & demonstrates understanding of HEP Baseline: SEE OBJECTIVE DATA Goal status: ongoing 06/11/2022   PLAN:   PT FREQUENCY: 2x/week   PT DURATION: 12 weeks   PLANNED INTERVENTIONS: Therapeutic exercises, Therapeutic activity, Neuromuscular re-education, Balance training, Gait training, Patient/Family education, Self Care, Stair training, Vestibular training, Canalith repositioning, Prosthetic training, DME instructions, and physical performance training   PLAN FOR NEXT SESSION: Do 10th visit progress note,   continue gait with [facility] for household mobility. Standing balance activities.     Vladimir Faster, PT, DPT 06/30/2022, 4:26 PM

## 2022-07-02 ENCOUNTER — Encounter: Payer: Self-pay | Admitting: Physical Therapy

## 2022-07-02 ENCOUNTER — Ambulatory Visit (INDEPENDENT_AMBULATORY_CARE_PROVIDER_SITE_OTHER): Payer: Medicare Other | Admitting: Physical Therapy

## 2022-07-02 DIAGNOSIS — R2681 Unsteadiness on feet: Secondary | ICD-10-CM

## 2022-07-02 DIAGNOSIS — M6281 Muscle weakness (generalized): Secondary | ICD-10-CM | POA: Diagnosis not present

## 2022-07-02 DIAGNOSIS — M79662 Pain in left lower leg: Secondary | ICD-10-CM

## 2022-07-02 DIAGNOSIS — R2689 Other abnormalities of gait and mobility: Secondary | ICD-10-CM

## 2022-07-02 DIAGNOSIS — R29898 Other symptoms and signs involving the musculoskeletal system: Secondary | ICD-10-CM

## 2022-07-02 NOTE — Therapy (Signed)
OUTPATIENT PHYSICAL THERAPY TREATMENT NOTE & PROGRESS NOTE  Patient Name: Tammy Good MRN: [id number] DOB:1950/11/27, 72 y.o., female Today's Date: 07/02/2022  PCP: general provider REFERRING PROVIDER: Dub Mikes, MD   Progress Note Reporting Period 05/28/2022 to 07/02/2022  See note below for Objective Data and Assessment of Progress/Goals.      END OF SESSION:   PT End of Session - 07/02/22 1505     Visit Number 20    Number of Visits 25    Date for PT Re-Evaluation 07/17/22    Authorization Type Medicare & BCBS state health PPO    PT Start Time 1508    PT Stop Time 1547    PT Time Calculation (min) 39 min    Equipment Utilized During Treatment Gait belt    Activity Tolerance Patient tolerated treatment well;Patient limited by pain    Behavior During Therapy WFL for tasks assessed/performed               Past Medical History:  Diagnosis Date   CAD S/P percutaneous coronary angioplasty 08/2013   100% mRCA - PCI Integrity Resolute DES 3.0 mm x 38 mm - 3.35 mm; PTCA of RPA V 2.0 mm x 15 mm   CHF (congestive heart failure)    Cholesteatoma    right   Diabetes mellitus type 2 in obese    On insulin and Invokana   History of osteomyelitis L 5th Toe all 05/2019   s/p Partial Ray Amputation with partial closure; 6 wks Abx & LSFA Atherectomy/DEB PTA with Stent for focal dissection.   Hyperlipidemia with target LDL less than 70    Hypothyroidism (acquired)    Mild essential hypertension    Obesity (BMI 30-39.9) 11/17/2013   PAD (peripheral artery disease) 05/26/2019   05/26/19: Abd AoGram- BLE runoff -> L SFA orbital atherectomy - PTA w/ DEB & Stent 6 x 40 Luttonix (for focal dissection) - patent Pop A with 3 V runoff. LEA Dopplers 01/03/2020: RABI (prev) 0.68 (0.53)/ RTBI (prev) 0.58 (0.33); LABI (prev) 0.80 (0.64), LTBI (prev) 0.64 (0.51); R mSFA ~50-74%, L mSFA 50-74%. Patent Prox SFA stent < 49% stenosis   Panhypopituitarism    ST elevation myocardial  infarction (STEMI) of inferior wall, subsequent episode of care 08/2013   80% branch of D1, 40% mid AV groove circumflex, 100% RCA with subacute thrombus -- thrombus extending into RPA V with 100% occlusion after initial angioplasty of mid RCA ;; Post MI ECHO 6/9/'15: EF 50-55%, mild LVH with moderate HK of inferior wall, Gr1 DD, mild LA dilation; mildly reduced RV function   Past Surgical History:  Procedure Laterality Date   ABDOMINAL AORTOGRAM W/LOWER EXTREMITY N/A 05/26/2019   Procedure: ABDOMINAL AORTOGRAM W/LOWER EXTREMITY;  Surgeon: Cephus Shelling, MD;  [facility]: [facility];  Service: Cardiovascular;  Laterality: N/A;   ABDOMINAL AORTOGRAM W/LOWER EXTREMITY N/A 11/15/2020   Procedure: ABDOMINAL AORTOGRAM W/LOWER EXTREMITY;  Surgeon: Cephus Shelling, MD;  Location: [facility];  Service: Cardiovascular;  Laterality: N/A;   ABDOMINAL AORTOGRAM W/LOWER EXTREMITY N/A 04/18/2021   Procedure: ABDOMINAL AORTOGRAM W/LOWER EXTREMITY;  Surgeon: Cephus Shelling, MD;  [facility]: [facility];  Service: Cardiovascular;  Laterality: N/A;   ABDOMINAL AORTOGRAM W/LOWER EXTREMITY Left 07/11/2021   Procedure: ABDOMINAL AORTOGRAM W/LOWER EXTREMITY;  Surgeon: Cephus Shelling, MD;  [facility]: [facility];  Service: Cardiovascular;  Laterality: Left;   AMPUTATION Left 05/25/2019   Procedure: AMPUTATION RAY 5th;  Surgeon: Ovid Curd  R, DPM;  Location: [facility];  Service: Podiatry;  Laterality: Left;   AMPUTATION Left 01/14/2022   Procedure: LEFT BELOW THE KNEE AMPUTATION;  Surgeon: Terance Hart, MD;  Location: [facility] OR;  Service: Orthopedics;  Laterality: Left;  LENGTH OF SURGERY: 90 MINUTES   APPLICATION OF WOUND VAC Left 01/14/2022   Procedure: WOUND VAC PLACEMENT;  Surgeon: Terance Hart, MD;  Location: [facility] OR;  Service: Orthopedics;  Laterality: Left;   BONE BIOPSY Left 04/24/2021   Procedure: BONE BIOPSY;  Surgeon: Asencion Islam, DPM;  Location:  [facility];  Service: Podiatry;  Laterality: Left;  left ankle/fibula   BONE BIOPSY Left 07/15/2021   Procedure: LEFT FOOT BONE BIOPSY;  Surgeon: Vivi Barrack, DPM;  Location: [facility];  Service: Podiatry;  Laterality: Left;   Cardiac Event Monitor  July-August 2015   Sinus rhythm with PVCs   CHOLECYSTECTOMY     COLONOSCOPY N/A 08/31/2013   Procedure: COLONOSCOPY;  Surgeon: Charna Elizabeth, MD;  Location: [facility] ENDOSCOPY;  Service: Endoscopy;  Laterality: N/A;   CORONARY STENT INTERVENTION N/A 11/19/2020   Procedure: CORONARY STENT INTERVENTION;  Surgeon: Yvonne Kendall, MD;  [facility];  Service: Cardiovascular;  Laterality: N/A;   ESOPHAGOGASTRODUODENOSCOPY N/A 09/01/2013   Procedure: ESOPHAGOGASTRODUODENOSCOPY (EGD);  Surgeon: Theda Belfast, MD;  Location: [facility] ENDOSCOPY;  Service: Endoscopy;  Laterality: N/A;  bedside   ESOPHAGOGASTRODUODENOSCOPY (EGD) WITH PROPOFOL N/A 05/26/2021   Procedure: ESOPHAGOGASTRODUODENOSCOPY (EGD) WITH PROPOFOL;  Surgeon: Jeani Hawking, MD;  Location: [facility] ENDOSCOPY;  Service: Gastroenterology;  Laterality: N/A;   EYE SURGERY Bilateral    bilateral cataracts   INCISION AND DRAINAGE Left 04/24/2021   Procedure: INCISION AND DRAINAGE;  Surgeon: Asencion Islam, DPM;  [facility]: [facility];  Service: Podiatry;  Laterality: Left;   IRRIGATION AND DEBRIDEMENT FOOT Left 07/15/2021   Procedure: IRRIGATION AND DEBRIDEMENT FOOT;  Surgeon: Vivi Barrack, DPM;  [facility]: [facility];  Service: Podiatry;  Laterality: Left;   LEFT HEART CATH AND CORONARY ANGIOGRAPHY N/A 11/19/2020   Procedure: LEFT HEART CATH AND CORONARY ANGIOGRAPHY;  Surgeon: Yvonne Kendall, MD;  [facility];  Service: Cardiovascular;  Laterality: N/A;   LEFT HEART CATHETERIZATION WITH CORONARY ANGIOGRAM N/A 08/30/2013   Procedure: LEFT HEART CATHETERIZATION WITH CORONARY ANGIOGRAM;  Surgeon: Marykay Lex, MD;  Location: [facility] CATH LAB: 100% mRCA (thrombus - extends to RPAV), 80% D1,  40% AVG Cx.   PERCUTANEOUS CORONARY STENT INTERVENTION (PCI-S)  08/30/2013   Procedure: PERCUTANEOUS CORONARY STENT INTERVENTION (PCI-S);  Surgeon: Marykay Lex, MD;  Location: [facility] CATH LAB;  Integrity Resolute DES 2.0 mm x 38 mm -- 3.35 mm.; PTCA of proximal RPA V. - 3.0 mm x 15 mm balloon   PERIPHERAL VASCULAR ATHERECTOMY  05/26/2019   Procedure: PERIPHERAL VASCULAR ATHERECTOMY;  Surgeon: Cephus Shelling, MD;  [facility];  Service: Cardiovascular;;  Left SFA   PERIPHERAL VASCULAR BALLOON ANGIOPLASTY Left 07/11/2021   Procedure: PERIPHERAL VASCULAR BALLOON ANGIOPLASTY;  Surgeon: Cephus Shelling, MD;  [facility];  Service: Cardiovascular;  Laterality: Left;  PT TRUNK / AT   PERIPHERAL VASCULAR INTERVENTION  05/26/2019   Procedure: PERIPHERAL VASCULAR INTERVENTION;  Surgeon: Cephus Shelling, MD;  [facility];  Service: Cardiovascular;;  Left SFA   PERIPHERAL VASCULAR INTERVENTION Right 11/15/2020   Procedure: PERIPHERAL VASCULAR INTERVENTION;  Surgeon: Cephus Shelling, MD;  [facility];  Service: Cardiovascular;  Laterality: Right;  Superficial  Femoral Artery   PERIPHERAL VASCULAR INTERVENTION Left 07/11/2021   Procedure: PERIPHERAL VASCULAR INTERVENTION;  Surgeon: Cephus Shelling, MD;  Location: [facility] IN[facility] CV LAB;  Service: Cardiovascular;  Laterality: Left;  SFA   PITUITARY SURGERY     TEE WITHOUT CARDIOVERSION N/A 04/26/2021   Procedure: TRANSESOPHAGEAL ECHOCARDIOGRAM (TEE);  Surgeon: Jodelle Red, MD;  Location: [facility] ENDOSCOPY;  Service: Cardiovascular;  Laterality: N/A;   TRANSTHORACIC ECHOCARDIOGRAM  08/30/2013   mild LVH. EF 50-55%. Moderate HK of the entire inferior myocardium. GR 1 DD. Mild LA dilation. Mildly reduced RV function   TYMPANOMASTOIDECTOMY Right 12/28/2017   Procedure: RIGHT TYMPANOMASTOIDECTOMY;  Surgeon: Newman Pies, MD;  Location: [facility];  Service: ENT;   Laterality: Right;   Patient Active Problem List   Diagnosis Date Noted   Nocturia more than twice per night 02/03/2022   Unilateral complete BKA, left, initial encounter 01/17/2022   Osteomyelitis 01/13/2022   Normocytic anemia 01/13/2022   Atrial fibrillation 08/15/2021   Septic arthritis of foot 08/02/2021   Medication monitoring encounter 08/02/2021   Acute on chronic systolic CHF (congestive heart failure) 06/02/2021   Hypothyroidism 06/02/2021   Ulcer of esophagus without bleeding    Pressure injury of skin 05/31/2021   Hypokalemia 05/30/2021   Symptomatic anemia 05/29/2021   AKI (acute kidney injury) 05/29/2021   GI bleed 05/29/2021   Liver lesion 05/29/2021   Vertigo 05/24/2021   Generalized weakness 05/24/2021   Tenosynovitis of left ankle 04/25/2021   Thrombocytosis 04/23/2021   Fatty liver 06/19/2020   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 06/19/2020   Atherosclerotic heart disease of native coronary artery without angina pectoris 03/20/2020   Epigastric pain 03/20/2020   Nausea 03/20/2020   Absence of toe 06/28/2019   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   PAD (peripheral artery disease) 05/26/2019   Osteomyelitis of left foot 05/24/2019   Cellulitis and abscess of foot, except toes 05/18/2019   Chronic osteomyelitis of ankle and foot 05/18/2019   Cholesteatoma 02/24/2019   Cellulitis and abscess of toe 11/09/2018   Colon cancer screening 11/30/2017   Long term (current) use of insulin 03/05/2017   Iron deficiency anemia 12/10/2014   Obesity (BMI 30-39.9) 11/17/2013   Diabetes mellitus, type 2    Essential hypertension    Right thigh pain 11/01/2013   PVC's (premature ventricular contractions) 11/01/2013   Status post insertion of drug eluting coronary artery stent to [facility] emergently and PTCA to prox. PLA 09/16/2013   Hyperlipidemia associated with type 2 diabetes mellitus 09/16/2013   Presence of coronary angioplasty implant and  graft 09/08/2013   Panhypopituitarism (HCC) 08/30/2013   Non-ST elevation (NSTEMI) myocardial infarction 08/22/2013   CAD S/P percutaneous coronary angioplasty 08/22/2013    REFERRING DIAG: Y48.250 (ICD-10-CM) - Hx of BKA, left    ONSET DATE:  03/19/2022 prosthesis delivery   THERAPY DIAG:  Other abnormalities of gait and mobility  Unsteadiness on feet  Pain in left lower leg  Muscle weakness (generalized)  Other symptoms and signs involving the musculoskeletal system  Rationale for Evaluation and Treatment Rehabilitation  PERTINENT HISTORY: CAD, STEMI, CHF, PAD, A-Fib, IDDM2, severe septic arthritis   PRECAUTIONS: Fall  SUBJECTIVE:  SUBJECTIVE STATEMENT  Her knee felt like it popped last PT session and it has felt funny.  Her blood sugars have stabilized again.   PAIN:  Are you having pain? Yes: NPRS scale:  today  0/10 and in last week 0/10 Pain location: left distal tibia, posterior knee & distal limb.  Pain description: sore Aggravating factors: walking & standing.  Relieving factors: taking prosthesis off   Back pain lumbar: 0/10 today & in last week 6/10 walking or standing but goes away with sitting.            OBJECTIVE: (objective measures completed at initial evaluation unless otherwise dated)  COGNITION: 04/21/2022:  Overall cognitive status: Within functional limits for tasks assessed   POSTURE: 04/21/2022:  rounded shoulders, forward head, flexed trunk , and weight shift right   LOWER EXTREMITY ROM:   ROM P:passive  A:active Left Eval 04/21/22:  Hip flexion WFL  Hip extension WFL  Hip abduction WFL  Hip adduction    Hip internal rotation    Hip external rotation    Knee flexion 90*  Knee extension -5*  Ankle dorsiflexion NA  Ankle plantarflexion NA  Ankle inversion NA   Ankle eversion NA   (Blank rows = not tested)   LOWER EXTREMITY MMT:   MMT Left Eval 04/21/22  Hip flexion 4/5  Hip extension 3+/5  Hip abduction 3+/5  Hip adduction    Hip internal rotation    Hip external rotation    Knee flexion 3+/5  Knee extension 4-/5  Ankle dorsiflexion NA  Ankle plantarflexion NA  Ankle inversion NA  Ankle eversion NA  (Blank rows = not tested)   TRANSFERS: 05/26/2022:  sit to/from stand pushing off seat of chair (no armrests needed) and stabilizes without UE support.   04/21/2022:  Sit to stand: SBA requires armrests & BUEs to push up but able to stabilize without touching RW 04/21/2022:  Stand to sit: SBA requires armrests & BUEs to control descent.    GAIT: 05/28/2022: gait velocity with SBQC 1.34 ft/sec.  05/26/2022: pt amb 99' with SBQC with minA.  Pt amb >300' with RW or rollator walker safely.   04/21/2022:  Gait pattern: step to pattern, decreased step length- Right, decreased stance time- Left, decreased hip/knee flexion- Left, circumduction- Left, Left hip hike, knee flexed in stance- Left, antalgic, lateral hip instability, trunk flexed, and abducted- Left Distance walked: 26' Assistive device utilized: Environmental consultant - 2 wheeled and TTA prosthesis Level of assistance: SBA  verbal cues only with RW Comments: excessive UE weight bearing on RW   FUNCTIONAL TESTs:  07/02/2022:  Timed Up & Go with SBQC Standard 16.88sec; manual TUG 18.65 sec (right knee effected sit to stand); cognitive (recipe) TUG 17.97 sec  06/16/2022:  Berg Balance 36/56  05/26/2022: Timed Up & Go Standard 24.32sec; manual TUG 26.87 sec; cognitive (recipe) TUG 30.47 sec  05/22/2022:  Sharlene Motts Balance 29/56  04/28/2022:  PT assessed vertigo:  Charlesetta Ivory  - left cervical rotation sit to supine no nystagmus, right rotation no dizziness and rolling right. -right cervical rotation sit to supine no nystagmus, left rotation slight dizziness and rolling left slight dizziness. Austin Miles To  right light dizziness sit to supine & severe dizziness sitting up. To left light dizziness sit to supine & severe dizziness sitting up.  Orthostatic Hypotension Supine no dizziness  145/70 HR 80  Standing light dizziness 119/67 HR 108 After standing 3 min  no dizziness BP 158/76 HR 112 Positive for orthostatic  Hypotension   04/21/2022: Dizziness with head motions.  Berg Balance Scale: 19/56    CURRENT PROSTHETIC WEAR ASSESSMENT:  04/21/2022: Patient is independent with: prosthetic cleaning Patient is dependent with: skin check, residual limb care, correct ply sock adjustment, proper wear schedule/adjustment, and proper weight-bearing schedule/adjustment Donning prosthesis: SBA Doffing prosthesis: Modified independence Prosthetic wear tolerance: ~14 hours total between 2x/day, 7 days/week Prosthetic weight bearing tolerance: 5 minutes with partial weight on prosthesis with limb pain 8/10 Edema: pitting Residual limb condition: no open areas but one small area distal tibia that may be internal suture working out,  shiny skin with no hair growth, redness over patella & distal tibia, no temperature issues noted. Cylindrical shape Prosthetic description: silicon liner with pin lock suspension, total contact socket with flexible inner socket, dynamic response foot/ankle       TODAY'S TREATMENT:                                                                                                                             DATE:  07/02/2022: Prosthetic Training with Transtibial Prosthesis: See objective for TUG scores which have improved.  Pt sweeping & mopping without device except broom or mop for balance. Pt cues on offset feet with weight shift for functional activity. PT & pt discussed functional activities like laundry and baking that she can now perform without issues including dizziness.  Pt amb 75' X 2 with SBQC with SBA. Patient able to direct PT how to assist with negotiating 6 inch  curb and 12* incline/ramp with SBQC.      06/30/2022: Prosthetic Training with Transtibial Prosthesis: PT educated pt on changing shoes with same heel height with demo & verbal cues. Pt verbalized understanding. PT educated pt on socket revision typical at 6-12 months.  She can have prosthetic cover if desires but would cover up laminated image in socket.  So she should discuss which option she prefers prior to having new socket made. 6 month would be June 27th. Pt verbalized understanding  Pt amb 75' X 2 with SBQC with SBA.  Pt neg 6" curb with [facility] & HHA with pt correctly directing PT how to assist her.     06/25/2022: Prosthetic Training with Transtibial Prosthesis: PT had patient direct her as if she were a family member with no knowledge how to assist correctly.  Patient able to correctly direct PT how to assist her when ambulating with [facility] if she is starting to fatigue and needs extra support.  Patient ambulated 150 feet total with [facility] & HHA.  patient able to direct PT how to assist with negotiating 6 inch curb and 12* incline/ramp.  Patient perform step up and step down on 4 inch step with single rail & SBQC leading with each lower extremity 2 reps 2 sets.  Patient verbalizes how to perform this at home with a 4 inch concrete block near railing on her porch.  Patient aware this is to develop strength  in her lower extremities to improve her ability to negotiate curbs and stairs.    HOME EXERCISE PROGRAM: Access Code: [email or web address] URL: [email or web address] Date: 06/02/2022 Prepared by: Vladimir Fasterobin Nohealani Medinger  Exercises - wide stance head motions eyes open  - 1 x daily - 4 x weekly - 1 sets - 10 reps - 2 seconds hold - Feet Apart with Eyes Closed with Head Motions  - 1 x daily - 4 x weekly - 1 sets - 10 reps - 2 seconds hold - stand on foam eyes open   - 1 x daily - 4 x weekly - 1 sets - 10 reps - 2 seconds hold - Alternating Punch with Resistance  - 1 x daily - 3-4 x weekly - 1 sets  - 3-5 reps - Standing Scapular Protraction with Resistance  - 1 x daily - 3-4 x weekly - 1 sets - 3-5 reps - Standing alternate rows with resistance  - 1 x daily - 3-4 x weekly - 1 sets - 3-5 reps - Standing Row with Anchored Resistance  - 1 x daily - 3-4 x weekly - 1 sets - 3-5 reps - Alternating elbow flexion with resistance  - 1 x daily - 3-4 x weekly - 1 sets - 3-5 reps - Standing Bicep Curls with Resistance  - 1 x daily - 3-4 x weekly - 1 sets - 3-5 reps    Do each exercise 5-10 repetitions Hold each exercise for 2 seconds to feel your location  AT SINK FIND YOUR MIDLINE POSITION AND PLACE FEET EQUAL DISTANCE FROM THE MIDLINE.  Try to find this position when standing still for activities.   USE TAPE ON FLOOR TO MARK THE MIDLINE POSITION which is even with middle of sink.  You also should try to feel with your limb pressure in socket.  You are trying to feel with limb what you used to feel with the bottom of your foot.  Side to Side Shift: Moving your hips only (not shoulders): move weight onto your left leg, HOLD/FEEL pressure in socket.  Move back to equal weight on each leg, HOLD/FEEL pressure in socket. Move weight onto your right leg, HOLD/FEEL pressure in socket. Move back to equal weight on each leg, HOLD/FEEL pressure in socket. Repeat.  Start with both hands on sink, progress to hand on prosthetic side only, then no hands.  Front to Back Shift: Moving your hips only (not shoulders): move your weight forward onto your toes, HOLD/FEEL pressure in socket. Move your weight back to equal Flat Foot on both legs, HOLD/FEEL  pressure in socket. Move your weight back onto your heels, HOLD/FEEL  pressure in socket. Move your weight back to equal on both legs, HOLD/FEEL  pressure in socket. Repeat.  Start with both hands on sink, progress to hand on prosthetic side only, then no hands.  Moving Cones / Cups: With equal weight on each leg: Hold on with one hand the first time, then progress to no  hand supports. Move cups from one side of sink to the other. Place cups ~2" out of your reach, progress to 10" beyond reach.  Place one hand in middle of sink and reach with other hand. Do both arms.  Then hover one hand and move cups with other hand.  Overhead/Upward Reaching: alternated reaching up to top cabinets or ceiling if no cabinets present. Keep equal weight on each leg. Start with one hand support on counter while other hand reaches and progress to no hand  support with reaching.  ace one hand in middle of sink and reach with other hand. Do both arms.  Then hover one hand and move cups with other hand.  5.   Looking Over Shoulders: With equal weight on each leg: alternate turning to look over your shoulders with one hand support on counter as needed.  Start with head motions only to look in front of shoulder, then even with shoulder and progress to looking behind you. To look to side, move head /eyes, then shoulder on side looking pulls back, shift more weight to side looking and pull hip back. Place one hand in middle of sink and let go with other hand so your shoulder can pull back. Switch hands to look other way.   Then hover one hand and look over shoulder. If looking right, use left hand at sink. If looking left, use right hand at sink. 6.  Stepping with leg that is not amputated:  Move items under cabinet out of your way. Shift your hips/pelvis so weight on prosthesis. Tighten muscles in hip on prosthetic side.  SLOWLY step other leg so front of foot is in cabinet. Then step back to floor.   Back & standing endurance HEP on 05/12/22 Stand at sink with chair back on your right & left and w/c or locked rollator behind you.  Place back of pelvis against counter with feet under your pelvis.  Rest your hands on counter beside you.  Time how long you can stand upright.  Try to increase time over days & weeks.  Stand facing w/c lean forward placing both hands in seat & stand back up straight, then  lean forward hovering hands over seat Facing sink: Rotate upper body to place item on back of each chair on right & left. Side bend placing water bottle in each seat. Reach over head following hands with eyes.     ASSESSMENT:  CLINICAL IMPRESSION: Patient improved greater than minimal significant difference with Timed Up & Go standard, manual & cognitive indicating lower fall risk.  She reports significant improvement in functional activities around her home and ability to access community including driving.  PT anticipates discharge at end of plan of care in 2 weeks.     OBJECTIVE IMPAIRMENTS: Abnormal gait, decreased activity tolerance, decreased balance, decreased endurance, decreased knowledge of condition, decreased knowledge of use of DME, decreased mobility, difficulty walking, decreased ROM, decreased strength, increased edema, impaired flexibility, postural dysfunction, prosthetic dependency , obesity, and pain.    ACTIVITY LIMITATIONS: carrying, lifting, bending, standing, stairs, transfers, and locomotion level   PARTICIPATION LIMITATIONS: meal prep, cleaning, laundry, driving, community activity, and church   PERSONAL FACTORS: Fitness, Time since onset of injury/illness/exacerbation, and 3+ comorbidities: see PMH  are also affecting patient's functional outcome.    REHAB POTENTIAL: Good   CLINICAL DECISION MAKING: Evolving/moderate complexity   EVALUATION COMPLEXITY: Moderate     GOALS: Goals reviewed with patient? Yes   SHORT TERM GOALS: Target date: 06/20/2022   Patient verbalizes how to adjust ply socks with limb volume changes. Baseline: SEE OBJECTIVE DATA Goal status: MET 06/16/2022 2.  Patient tolerates prosthesis >90% awake hrs /day without skin issues or limb pain <2/10 after standing. Baseline: SEE OBJECTIVE DATA Goal status: MET 06/16/2022   3. Berg Balance >/= 36/56. Baseline: SEE OBJECTIVE DATA Goal status:  MET 06/16/2022   4. Patient ambulates 29' with  [facility] & prosthesis with supervision. Baseline: SEE OBJECTIVE DATA Goal status: MET 06/16/2022  LONG TERM GOALS: Target date: 07/17/2022   Patient demonstrates & verbalized understanding of prosthetic care to enable safe utilization of prosthesis. Baseline: SEE OBJECTIVE DATA Goal status: ongoing 06/11/2022   Patient tolerates prosthesis wear >90% of awake hours without skin or limb pain issues. Baseline: SEE OBJECTIVE DATA Goal status: ongoing 06/11/2022   Berg Balance >36/56 to indicate lower fall risk Baseline: SEE OBJECTIVE DATA Goal status: ongoing 06/11/2022   Patient ambulates >300' with prosthesis & LRADindependently Baseline: SEE OBJECTIVE DATA Goal status: ongoing 06/11/2022   Patient negotiates ramps, curbs with LRAD & stairs with single rail with prosthesis independently. Baseline: SEE OBJECTIVE DATA Goal status:  ongoing 06/11/2022   Patient verbalizes & demonstrates understanding of HEP Baseline: SEE OBJECTIVE DATA Goal status: ongoing 06/11/2022   PLAN:   PT FREQUENCY: 2x/week   PT DURATION: 12 weeks   PLANNED INTERVENTIONS: Therapeutic exercises, Therapeutic activity, Neuromuscular re-education, Balance training, Gait training, Patient/Family education, Self Care, Stair training, Vestibular training, Canalith repositioning, Prosthetic training, DME instructions, and physical performance training   PLAN FOR NEXT SESSION: reduce frequency to 1x/wk for remaining 2 weeks, address functional activities that she still finds challenging, continue gait with [facility] for household mobility. Standing balance activities.     Vladimir Faster, PT, DPT 07/02/2022, 3:55 PM

## 2022-07-09 ENCOUNTER — Encounter: Payer: Self-pay | Admitting: Physical Therapy

## 2022-07-09 ENCOUNTER — Ambulatory Visit (INDEPENDENT_AMBULATORY_CARE_PROVIDER_SITE_OTHER): Payer: Medicare Other | Admitting: Physical Therapy

## 2022-07-09 DIAGNOSIS — M6281 Muscle weakness (generalized): Secondary | ICD-10-CM

## 2022-07-09 DIAGNOSIS — R29898 Other symptoms and signs involving the musculoskeletal system: Secondary | ICD-10-CM

## 2022-07-09 DIAGNOSIS — R2681 Unsteadiness on feet: Secondary | ICD-10-CM

## 2022-07-09 DIAGNOSIS — M79662 Pain in left lower leg: Secondary | ICD-10-CM | POA: Diagnosis not present

## 2022-07-09 DIAGNOSIS — R2689 Other abnormalities of gait and mobility: Secondary | ICD-10-CM | POA: Diagnosis not present

## 2022-07-09 NOTE — Therapy (Signed)
OUTPATIENT PHYSICAL THERAPY TREATMENT NOTE  Patient Name: Tammy Good MRN: [id number] DOB:1950-07-26, 72 y.o., female Today's Date: 07/09/2022  PCP: general provider REFERRING PROVIDER: Dub Mikes, MD    END OF SESSION:   PT End of Session - 07/09/22 1510     Visit Number 21    Number of Visits 25    Date for PT Re-Evaluation 07/17/22    Authorization Type Medicare & BCBS state health PPO    PT Start Time 1510    PT Stop Time 1558    PT Time Calculation (min) 48 min    Equipment Utilized During Treatment Gait belt    Activity Tolerance Patient tolerated treatment well;Patient limited by pain    Behavior During Therapy WFL for tasks assessed/performed               Past Medical History:  Diagnosis Date   CAD S/P percutaneous coronary angioplasty 08/2013   100% mRCA - PCI Integrity Resolute DES 3.0 mm x 38 mm - 3.35 mm; PTCA of RPA V 2.0 mm x 15 mm   CHF (congestive heart failure)    Cholesteatoma    right   Diabetes mellitus type 2 in obese    On insulin and Invokana   History of osteomyelitis L 5th Toe all 05/2019   s/p Partial Ray Amputation with partial closure; 6 wks Abx & LSFA Atherectomy/DEB PTA with Stent for focal dissection.   Hyperlipidemia with target LDL less than 70    Hypothyroidism (acquired)    Mild essential hypertension    Obesity (BMI 30-39.9) 11/17/2013   PAD (peripheral artery disease) 05/26/2019   05/26/19: Abd AoGram- BLE runoff -> L SFA orbital atherectomy - PTA w/ DEB & Stent 6 x 40 Luttonix (for focal dissection) - patent Pop A with 3 V runoff. LEA Dopplers 01/03/2020: RABI (prev) 0.68 (0.53)/ RTBI (prev) 0.58 (0.33); LABI (prev) 0.80 (0.64), LTBI (prev) 0.64 (0.51); R mSFA ~50-74%, L mSFA 50-74%. Patent Prox SFA stent < 49% stenosis   Panhypopituitarism    ST elevation myocardial infarction (STEMI) of inferior wall, subsequent episode of care 08/2013   80% branch of D1, 40% mid AV groove circumflex, 100% RCA with subacute thrombus  -- thrombus extending into RPA V with 100% occlusion after initial angioplasty of mid RCA ;; Post MI ECHO 6/9/'15: EF 50-55%, mild LVH with moderate HK of inferior wall, Gr1 DD, mild LA dilation; mildly reduced RV function   Past Surgical History:  Procedure Laterality Date   ABDOMINAL AORTOGRAM W/LOWER EXTREMITY N/A 05/26/2019   Procedure: ABDOMINAL AORTOGRAM W/LOWER EXTREMITY;  Surgeon: Cephus Shelling, MD;  [facility]: [facility];  Service: Cardiovascular;  Laterality: N/A;   ABDOMINAL AORTOGRAM W/LOWER EXTREMITY N/A 11/15/2020   Procedure: ABDOMINAL AORTOGRAM W/LOWER EXTREMITY;  Surgeon: Cephus Shelling, MD;  Location: [facility];  Service: Cardiovascular;  Laterality: N/A;   ABDOMINAL AORTOGRAM W/LOWER EXTREMITY N/A 04/18/2021   Procedure: ABDOMINAL AORTOGRAM W/LOWER EXTREMITY;  Surgeon: Cephus Shelling, MD;  Location: [facility];  Service: Cardiovascular;  Laterality: N/A;   ABDOMINAL AORTOGRAM W/LOWER EXTREMITY Left 07/11/2021   Procedure: ABDOMINAL AORTOGRAM W/LOWER EXTREMITY;  Surgeon: Cephus Shelling, MD;  Location: [facility];  Service: Cardiovascular;  Laterality: Left;   AMPUTATION Left 05/25/2019   Procedure: AMPUTATION RAY 5th;  Surgeon: Vivi Barrack, DPM;  Location: [facility];  Service: Podiatry;  Laterality: Left;   AMPUTATION Left 01/14/2022   Procedure: LEFT BELOW THE KNEE AMPUTATION;  Surgeon: Terance Hart, MD;  Location: [facility] OR;  Service: Orthopedics;  Laterality: Left;  LENGTH OF SURGERY: 90 MINUTES   APPLICATION OF WOUND VAC Left 01/14/2022   Procedure: WOUND VAC PLACEMENT;  Surgeon: Terance Hart, MD;  Location: [facility] OR;  Service: Orthopedics;  Laterality: Left;   BONE BIOPSY Left 04/24/2021   Procedure: BONE BIOPSY;  Surgeon: Asencion Islam, DPM;  Location: [facility];  Service: Podiatry;  Laterality: Left;  left ankle/fibula   BONE BIOPSY Left 07/15/2021   Procedure: LEFT FOOT BONE BIOPSY;  Surgeon: Vivi Barrack, DPM;  Location: [facility];  Service: Podiatry;  Laterality: Left;   Cardiac Event Monitor  July-August 2015   Sinus rhythm with PVCs   CHOLECYSTECTOMY     COLONOSCOPY N/A 08/31/2013   Procedure: COLONOSCOPY;  Surgeon: Charna Elizabeth, MD;  Location: [facility] ENDOSCOPY;  Service: Endoscopy;  Laterality: N/A;   CORONARY STENT INTERVENTION N/A 11/19/2020   Procedure: CORONARY STENT INTERVENTION;  Surgeon: Yvonne Kendall, MD;  [facility];  Service: Cardiovascular;  Laterality: N/A;   ESOPHAGOGASTRODUODENOSCOPY N/A 09/01/2013   Procedure: ESOPHAGOGASTRODUODENOSCOPY (EGD);  Surgeon: Theda Belfast, MD;  Location: [facility] ENDOSCOPY;  Service: Endoscopy;  Laterality: N/A;  bedside   ESOPHAGOGASTRODUODENOSCOPY (EGD) WITH PROPOFOL N/A 05/26/2021   Procedure: ESOPHAGOGASTRODUODENOSCOPY (EGD) WITH PROPOFOL;  Surgeon: Jeani Hawking, MD;  Location: [facility] ENDOSCOPY;  Service: Gastroenterology;  Laterality: N/A;   EYE SURGERY Bilateral    bilateral cataracts   INCISION AND DRAINAGE Left 04/24/2021   Procedure: INCISION AND DRAINAGE;  Surgeon: Asencion Islam, DPM;  Location: [facility];  Service: Podiatry;  Laterality: Left;   IRRIGATION AND DEBRIDEMENT FOOT Left 07/15/2021   Procedure: IRRIGATION AND DEBRIDEMENT FOOT;  Surgeon: Vivi Barrack, DPM;  Location: [facility];  Service: Podiatry;  Laterality: Left;   LEFT HEART CATH AND CORONARY ANGIOGRAPHY N/A 11/19/2020   Procedure: LEFT HEART CATH AND CORONARY ANGIOGRAPHY;  Surgeon: Yvonne Kendall, MD;  [facility];  Service: Cardiovascular;  Laterality: N/A;   LEFT HEART CATHETERIZATION WITH CORONARY ANGIOGRAM N/A 08/30/2013   Procedure: LEFT HEART CATHETERIZATION WITH CORONARY ANGIOGRAM;  Surgeon: Marykay Lex, MD;  Location: [facility] CATH LAB: 100% mRCA (thrombus - extends to RPAV), 80% D1, 40% AVG Cx.   PERCUTANEOUS CORONARY STENT INTERVENTION (PCI-S)  08/30/2013   Procedure: PERCUTANEOUS CORONARY STENT INTERVENTION (PCI-S);  Surgeon:  Marykay Lex, MD;  Location: [facility] CATH LAB;  Integrity Resolute DES 2.0 mm x 38 mm -- 3.35 mm.; PTCA of proximal RPA V. - 3.0 mm x 15 mm balloon   PERIPHERAL VASCULAR ATHERECTOMY  05/26/2019   Procedure: PERIPHERAL VASCULAR ATHERECTOMY;  Surgeon: Cephus Shelling, MD;  [facility];  Service: Cardiovascular;;  Left SFA   PERIPHERAL VASCULAR BALLOON ANGIOPLASTY Left 07/11/2021   Procedure: PERIPHERAL VASCULAR BALLOON ANGIOPLASTY;  Surgeon: Cephus Shelling, MD;  [facility];  Service: Cardiovascular;  Laterality: Left;  PT TRUNK / AT   PERIPHERAL VASCULAR INTERVENTION  05/26/2019   Procedure: PERIPHERAL VASCULAR INTERVENTION;  Surgeon: Cephus Shelling, MD;  [facility];  Service: Cardiovascular;;  Left SFA   PERIPHERAL VASCULAR INTERVENTION Right 11/15/2020   Procedure: PERIPHERAL VASCULAR INTERVENTION;  Surgeon: Cephus Shelling, MD;  [facility];  Service: Cardiovascular;  Laterality: Right;  Superficial Femoral Artery   PERIPHERAL VASCULAR INTERVENTION Left 07/11/2021   Procedure: PERIPHERAL VASCULAR INTERVENTION;  Surgeon: Cephus Shelling, MD;  Location: [facility] INVASIVE CV  LAB;  Service: Cardiovascular;  Laterality: Left;  SFA   PITUITARY SURGERY     TEE WITHOUT CARDIOVERSION N/A 04/26/2021   Procedure: TRANSESOPHAGEAL ECHOCARDIOGRAM (TEE);  Surgeon: Jodelle Red, MD;  Location: [facility] ENDOSCOPY;  Service: Cardiovascular;  Laterality: N/A;   TRANSTHORACIC ECHOCARDIOGRAM  08/30/2013   mild LVH. EF 50-55%. Moderate HK of the entire inferior myocardium. GR 1 DD. Mild LA dilation. Mildly reduced RV function   TYMPANOMASTOIDECTOMY Right 12/28/2017   Procedure: RIGHT TYMPANOMASTOIDECTOMY;  Surgeon: Newman Pies, MD;  Location: [facility];  Service: ENT;  Laterality: Right;   Patient Active Problem List   Diagnosis Date Noted   Nocturia more than twice per night 02/03/2022   Unilateral complete  BKA, left, initial encounter 01/17/2022   Osteomyelitis 01/13/2022   Normocytic anemia 01/13/2022   Atrial fibrillation 08/15/2021   Septic arthritis of foot 08/02/2021   Medication monitoring encounter 08/02/2021   Acute on chronic systolic CHF (congestive heart failure) 06/02/2021   Hypothyroidism 06/02/2021   Ulcer of esophagus without bleeding    Pressure injury of skin 05/31/2021   Hypokalemia 05/30/2021   Symptomatic anemia 05/29/2021   AKI (acute kidney injury) 05/29/2021   GI bleed 05/29/2021   Liver lesion 05/29/2021   Vertigo 05/24/2021   Generalized weakness 05/24/2021   Tenosynovitis of left ankle 04/25/2021   Thrombocytosis 04/23/2021   Fatty liver 06/19/2020   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 06/19/2020   Atherosclerotic heart disease of native coronary artery without angina pectoris 03/20/2020   Epigastric pain 03/20/2020   Nausea 03/20/2020   Absence of toe 06/28/2019   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   PAD (peripheral artery disease) 05/26/2019   Osteomyelitis of left foot 05/24/2019   Cellulitis and abscess of foot, except toes 05/18/2019   Chronic osteomyelitis of ankle and foot 05/18/2019   Cholesteatoma 02/24/2019   Cellulitis and abscess of toe 11/09/2018   Colon cancer screening 11/30/2017   Long term (current) use of insulin 03/05/2017   Iron deficiency anemia 12/10/2014   Obesity (BMI 30-39.9) 11/17/2013   Diabetes mellitus, type 2    Essential hypertension    Right thigh pain 11/01/2013   PVC's (premature ventricular contractions) 11/01/2013   Status post insertion of drug eluting coronary artery stent to [facility] emergently and PTCA to prox. PLA 09/16/2013   Hyperlipidemia associated with type 2 diabetes mellitus 09/16/2013   Presence of coronary angioplasty implant and graft 09/08/2013   Panhypopituitarism (HCC) 08/30/2013   Non-ST elevation (NSTEMI) myocardial infarction 08/22/2013   CAD S/P percutaneous  coronary angioplasty 08/22/2013    REFERRING DIAG: V25.366 (ICD-10-CM) - Hx of BKA, left    ONSET DATE:  03/19/2022 prosthesis delivery   THERAPY DIAG:  Other abnormalities of gait and mobility  Unsteadiness on feet  Pain in left lower leg  Muscle weakness (generalized)  Other symptoms and signs involving the musculoskeletal system  Rationale for Evaluation and Treatment Rehabilitation  PERTINENT HISTORY: CAD, STEMI, CHF, PAD, A-Fib, IDDM2, severe septic arthritis   PRECAUTIONS: Fall  SUBJECTIVE:  SUBJECTIVE STATEMENT  She has gone out to eat without issue using her SBQC. She went to church without issues.   PAIN:  Are you having pain? Yes: NPRS scale:  today  0/10 and in last week 0/10 Pain location: left distal tibia, posterior knee & distal limb.  Pain description: sore Aggravating factors: walking & standing.  Relieving factors: taking prosthesis off   Back pain lumbar: 0/10 today & in last week 6/10 walking or standing but goes away with sitting.            OBJECTIVE: (objective measures completed at initial evaluation unless otherwise dated)  COGNITION: 04/21/2022:  Overall cognitive status: Within functional limits for tasks assessed   POSTURE: 04/21/2022:  rounded shoulders, forward head, flexed trunk , and weight shift right   LOWER EXTREMITY ROM:   ROM P:passive  A:active Left Eval 04/21/22:  Hip flexion WFL  Hip extension WFL  Hip abduction WFL  Hip adduction    Hip internal rotation    Hip external rotation    Knee flexion 90*  Knee extension -5*  Ankle dorsiflexion NA  Ankle plantarflexion NA  Ankle inversion NA  Ankle eversion NA   (Blank rows = not tested)   LOWER EXTREMITY MMT:   MMT Left Eval 04/21/22  Hip flexion 4/5  Hip extension 3+/5  Hip abduction 3+/5   Hip adduction    Hip internal rotation    Hip external rotation    Knee flexion 3+/5  Knee extension 4-/5  Ankle dorsiflexion NA  Ankle plantarflexion NA  Ankle inversion NA  Ankle eversion NA  (Blank rows = not tested)   TRANSFERS: 05/26/2022:  sit to/from stand pushing off seat of chair (no armrests needed) and stabilizes without UE support.   04/21/2022:  Sit to stand: SBA requires armrests & BUEs to push up but able to stabilize without touching RW 04/21/2022:  Stand to sit: SBA requires armrests & BUEs to control descent.    GAIT: 05/28/2022: gait velocity with SBQC 1.34 ft/sec.  05/26/2022: pt amb 98' with SBQC with minA.  Pt amb >300' with RW or rollator walker safely.   04/21/2022:  Gait pattern: step to pattern, decreased step length- Right, decreased stance time- Left, decreased hip/knee flexion- Left, circumduction- Left, Left hip hike, knee flexed in stance- Left, antalgic, lateral hip instability, trunk flexed, and abducted- Left Distance walked: 34' Assistive device utilized: Environmental consultant - 2 wheeled and TTA prosthesis Level of assistance: SBA  verbal cues only with RW Comments: excessive UE weight bearing on RW   FUNCTIONAL TESTs:  07/09/2022: Sharlene Motts Balance 43/56  [facility] PT Assessment - 07/09/22 1515       Standardized Balance Assessment   Standardized Balance Assessment Berg Balance Test      Berg Balance Test   Sit to Stand Able to stand  independently using hands    Standing Unsupported Able to stand safely 2 minutes    Sitting with Back Unsupported but Feet Supported on Floor or Stool Able to sit safely and securely 2 minutes    Stand to Sit Sits safely with minimal use of hands    Transfers Able to transfer safely, minor use of hands    Standing Unsupported with Eyes Closed Able to stand 10 seconds safely    Standing Unsupported with Feet Together Able to place feet together independently and stand 1 minute safely    From Standing, Reach Forward with Outstretched Arm  Can reach confidently >25 cm (10")  From Standing Position, Pick up Object from Floor Able to pick up shoe safely and easily    From Standing Position, Turn to Look Behind Over each Shoulder Looks behind one side only/other side shows less weight shift    Turn 360 Degrees Needs close supervision or verbal cueing    Standing Unsupported, Alternately Place Feet on Step/Stool Able to complete >2 steps/needs minimal assist    Standing Unsupported, One Foot in Front Able to take small step independently and hold 30 seconds    Standing on One Leg Tries to lift leg/unable to hold 3 seconds but remains standing independently    Total Score 43    Berg comment: BERG  < 36 high risk for falls (close to 100%) 46-51 moderate (>50%)   37-45 significant (>80%) 52-55 lower (> 25%)            07/02/2022:  Timed Up & Go with SBQC Standard 16.88sec; manual TUG 18.65 sec (right knee effected sit to stand); cognitive (recipe) TUG 17.97 sec  06/16/2022:  Berg Balance 36/56  05/26/2022: Timed Up & Go Standard 24.32sec; manual TUG 26.87 sec; cognitive (recipe) TUG 30.47 sec  05/22/2022:  Sharlene Motts Balance 29/56  04/28/2022:  PT assessed vertigo:  Charlesetta Ivory  - left cervical rotation sit to supine no nystagmus, right rotation no dizziness and rolling right. -right cervical rotation sit to supine no nystagmus, left rotation slight dizziness and rolling left slight dizziness. Austin Miles To right light dizziness sit to supine & severe dizziness sitting up. To left light dizziness sit to supine & severe dizziness sitting up.  Orthostatic Hypotension Supine no dizziness  145/70 HR 80  Standing light dizziness 119/67 HR 108 After standing 3 min  no dizziness BP 158/76 HR 112 Positive for orthostatic Hypotension  04/21/2022: Dizziness with head motions.  Berg Balance Scale: 19/56    CURRENT PROSTHETIC WEAR ASSESSMENT:  04/21/2022: Patient is independent with: prosthetic cleaning Patient is dependent with: skin  check, residual limb care, correct ply sock adjustment, proper wear schedule/adjustment, and proper weight-bearing schedule/adjustment Donning prosthesis: SBA Doffing prosthesis: Modified independence Prosthetic wear tolerance: ~14 hours total between 2x/day, 7 days/week Prosthetic weight bearing tolerance: 5 minutes with partial weight on prosthesis with limb pain 8/10 Edema: pitting Residual limb condition: no open areas but one small area distal tibia that may be internal suture working out,  shiny skin with no hair growth, redness over patella & distal tibia, no temperature issues noted. Cylindrical shape Prosthetic description: silicon liner with pin lock suspension, total contact socket with flexible inner socket, dynamic response foot/ankle       TODAY'S TREATMENT:                                                                                                                             DATE:  07/09/2022: Prosthetic Training with Transtibial Prosthesis: PT demo & verbal cues on side stepping up /down ramp that is steep.   PT  demo & verbal cues on floor transfer upon pt request.  She attempted using chair bottom but her left knee would not allow.  Physical Performance Testing. See objective for Berg Balance  Self-care: Upon pt request PT instructed in shoe design for diabetics.  Pt verbalized understanding with plans to have feet analyzed at [facility] Feet for guide for shoes.      07/02/2022: Prosthetic Training with Transtibial Prosthesis: See objective for TUG scores which have improved.  Pt sweeping & mopping without device except broom or mop for balance. Pt cues on offset feet with weight shift for functional activity. PT & pt discussed functional activities like laundry and baking that she can now perform without issues including dizziness.  Pt amb 75' X 2 with SBQC with SBA. Patient able to direct PT how to assist with negotiating 6 inch curb and 12* incline/ramp with SBQC.       06/30/2022: Prosthetic Training with Transtibial Prosthesis: PT educated pt on changing shoes with same heel height with demo & verbal cues. Pt verbalized understanding. PT educated pt on socket revision typical at 6-12 months.  She can have prosthetic cover if desires but would cover up laminated image in socket.  So she should discuss which option she prefers prior to having new socket made. 6 month would be June 27th. Pt verbalized understanding  Pt amb 75' X 2 with SBQC with SBA.  Pt neg 6" curb with [facility] & [facility] with pt correctly directing PT how to assist her.    HOME EXERCISE PROGRAM: Access Code: [email or web address] URL: [email or web address] Date: 06/02/2022 Prepared by: Vladimir Faster  Exercises - wide stance head motions eyes open  - 1 x daily - 4 x weekly - 1 sets - 10 reps - 2 seconds hold - Feet Apart with Eyes Closed with Head Motions  - 1 x daily - 4 x weekly - 1 sets - 10 reps - 2 seconds hold - stand on foam eyes open   - 1 x daily - 4 x weekly - 1 sets - 10 reps - 2 seconds hold - Alternating Punch with Resistance  - 1 x daily - 3-4 x weekly - 1 sets - 3-5 reps - Standing Scapular Protraction with Resistance  - 1 x daily - 3-4 x weekly - 1 sets - 3-5 reps - Standing alternate rows with resistance  - 1 x daily - 3-4 x weekly - 1 sets - 3-5 reps - Standing Row with Anchored Resistance  - 1 x daily - 3-4 x weekly - 1 sets - 3-5 reps - Alternating elbow flexion with resistance  - 1 x daily - 3-4 x weekly - 1 sets - 3-5 reps - Standing Bicep Curls with Resistance  - 1 x daily - 3-4 x weekly - 1 sets - 3-5 reps    Do each exercise 5-10 repetitions Hold each exercise for 2 seconds to feel your location  AT SINK FIND YOUR MIDLINE POSITION AND PLACE FEET EQUAL DISTANCE FROM THE MIDLINE.  Try to find this position when standing still for activities.   USE TAPE ON FLOOR TO MARK THE MIDLINE POSITION which is even with middle of sink.  You also should try to feel with  your limb pressure in socket.  You are trying to feel with limb what you used to feel with the bottom of your foot.  Side to Side Shift: Moving your hips only (not shoulders): move weight onto your left leg, HOLD/FEEL pressure in socket.  Move back to equal weight on each leg, HOLD/FEEL pressure in socket. Move weight onto your right leg, HOLD/FEEL pressure in socket. Move back to equal weight on each leg, HOLD/FEEL pressure in socket. Repeat.  Start with both hands on sink, progress to hand on prosthetic side only, then no hands.  Front to Back Shift: Moving your hips only (not shoulders): move your weight forward onto your toes, HOLD/FEEL pressure in socket. Move your weight back to equal Flat Foot on both legs, HOLD/FEEL  pressure in socket. Move your weight back onto your heels, HOLD/FEEL  pressure in socket. Move your weight back to equal on both legs, HOLD/FEEL  pressure in socket. Repeat.  Start with both hands on sink, progress to hand on prosthetic side only, then no hands.  Moving Cones / Cups: With equal weight on each leg: Hold on with one hand the first time, then progress to no hand supports. Move cups from one side of sink to the other. Place cups ~2" out of your reach, progress to 10" beyond reach.  Place one hand in middle of sink and reach with other hand. Do both arms.  Then hover one hand and move cups with other hand.  Overhead/Upward Reaching: alternated reaching up to top cabinets or ceiling if no cabinets present. Keep equal weight on each leg. Start with one hand support on counter while other hand reaches and progress to no hand support with reaching.  ace one hand in middle of sink and reach with other hand. Do both arms.  Then hover one hand and move cups with other hand.  5.   Looking Over Shoulders: With equal weight on each leg: alternate turning to look over your shoulders with one hand support on counter as needed.  Start with head motions only to look in front of shoulder,  then even with shoulder and progress to looking behind you. To look to side, move head /eyes, then shoulder on side looking pulls back, shift more weight to side looking and pull hip back. Place one hand in middle of sink and let go with other hand so your shoulder can pull back. Switch hands to look other way.   Then hover one hand and look over shoulder. If looking right, use left hand at sink. If looking left, use right hand at sink. 6.  Stepping with leg that is not amputated:  Move items under cabinet out of your way. Shift your hips/pelvis so weight on prosthesis. Tighten muscles in hip on prosthetic side.  SLOWLY step other leg so front of foot is in cabinet. Then step back to floor.   Back & standing endurance HEP on 05/12/22 Stand at sink with chair back on your right & left and w/c or locked rollator behind you.  Place back of pelvis against counter with feet under your pelvis.  Rest your hands on counter beside you.  Time how long you can stand upright.  Try to increase time over days & weeks.  Stand facing w/c lean forward placing both hands in seat & stand back up straight, then lean forward hovering hands over seat Facing sink: Rotate upper body to place item on back of each chair on right & left. Side bend placing water bottle in each seat. Reach over head following hands with eyes.     ASSESSMENT:  CLINICAL IMPRESSION: Patient improved Berg Balance score to 43/56 from 19/56 at evaluation on 04/21/2022.  The amount of increase indicates lower fall risk but  score <45/56 does indicate she still has a risk of falls.    OBJECTIVE IMPAIRMENTS: Abnormal gait, decreased activity tolerance, decreased balance, decreased endurance, decreased knowledge of condition, decreased knowledge of use of DME, decreased mobility, difficulty walking, decreased ROM, decreased strength, increased edema, impaired flexibility, postural dysfunction, prosthetic dependency , obesity, and pain.    ACTIVITY  LIMITATIONS: carrying, lifting, bending, standing, stairs, transfers, and locomotion level   PARTICIPATION LIMITATIONS: meal prep, cleaning, laundry, driving, community activity, and church   PERSONAL FACTORS: Fitness, Time since onset of injury/illness/exacerbation, and 3+ comorbidities: see PMH  are also affecting patient's functional outcome.    REHAB POTENTIAL: Good   CLINICAL DECISION MAKING: Evolving/moderate complexity   EVALUATION COMPLEXITY: Moderate     GOALS: Goals reviewed with patient? Yes   SHORT TERM GOALS: Target date: 06/20/2022   Patient verbalizes how to adjust ply socks with limb volume changes. Baseline: SEE OBJECTIVE DATA Goal status: MET 06/16/2022 2.  Patient tolerates prosthesis >90% awake hrs /day without skin issues or limb pain <2/10 after standing. Baseline: SEE OBJECTIVE DATA Goal status: MET 06/16/2022   3. Berg Balance >/= 36/56. Baseline: SEE OBJECTIVE DATA Goal status:  MET 06/16/2022   4. Patient ambulates 28' with [employer] & prosthesis with supervision. Baseline: SEE OBJECTIVE DATA Goal status: MET 06/16/2022    LONG TERM GOALS: Target date: 07/17/2022   Patient demonstrates & verbalized understanding of prosthetic care to enable safe utilization of prosthesis. Baseline: SEE OBJECTIVE DATA Goal status: MET 07/09/2022   Patient tolerates prosthesis wear >90% of awake hours without skin or limb pain issues. Baseline: SEE OBJECTIVE DATA Goal status: MET 07/09/2022   Berg Balance >36/56 to indicate lower fall risk Baseline: SEE OBJECTIVE DATA Goal status: MET 07/09/2022   Patient ambulates >300' with prosthesis & LRAD independently Baseline: SEE OBJECTIVE DATA Goal status: ongoing 06/11/2022   Patient negotiates ramps, curbs with LRAD & stairs with single rail with prosthesis independently. Baseline: SEE OBJECTIVE DATA Goal status:  ongoing 06/11/2022   Patient verbalizes & demonstrates understanding of HEP Baseline: SEE OBJECTIVE DATA Goal  status: ongoing 06/11/2022   PLAN:   PT FREQUENCY: 2x/week   PT DURATION: 12 weeks   PLANNED INTERVENTIONS: Therapeutic exercises, Therapeutic activity, Neuromuscular re-education, Balance training, Gait training, Patient/Family education, Self Care, Stair training, Vestibular training, Canalith repositioning, Prosthetic training, DME instructions, and physical performance training   PLAN FOR NEXT SESSION: check remaining LTGs & discharge   Vladimir Faster, PT, DPT 07/09/2022, 4:08 PM

## 2022-07-14 ENCOUNTER — Ambulatory Visit (INDEPENDENT_AMBULATORY_CARE_PROVIDER_SITE_OTHER): Payer: Medicare Other | Admitting: Physical Therapy

## 2022-07-14 ENCOUNTER — Encounter: Payer: Self-pay | Admitting: Physical Therapy

## 2022-07-14 DIAGNOSIS — M79662 Pain in left lower leg: Secondary | ICD-10-CM

## 2022-07-14 DIAGNOSIS — R2681 Unsteadiness on feet: Secondary | ICD-10-CM | POA: Diagnosis not present

## 2022-07-14 DIAGNOSIS — R29898 Other symptoms and signs involving the musculoskeletal system: Secondary | ICD-10-CM

## 2022-07-14 DIAGNOSIS — R2689 Other abnormalities of gait and mobility: Secondary | ICD-10-CM | POA: Diagnosis not present

## 2022-07-14 DIAGNOSIS — M6281 Muscle weakness (generalized): Secondary | ICD-10-CM | POA: Diagnosis not present

## 2022-07-14 NOTE — Therapy (Signed)
OUTPATIENT PHYSICAL THERAPY TREATMENT NOTE & DISCHARGE SUMMARY  Patient Name: BLINDA TUREK MRN: [id number] DOB:November 30, 1950, 72 y.o., female Today's Date: 07/14/2022  PCP: general provider REFERRING PROVIDER: Dub Mikes, MD   PHYSICAL THERAPY DISCHARGE SUMMARY  Visits from Start of Care: 22  Current functional level related to goals / functional outcomes: See below   Remaining deficits: See below   Education / Equipment: Patient was instructed in prosthetic care & HEP which she appears to understand.    Patient agrees to discharge. Patient goals were met. Patient is being discharged due to meeting the stated rehab goals.   END OF SESSION:   PT End of Session - 07/14/22 1511     Visit Number 22    Number of Visits 25    Date for PT Re-Evaluation 07/17/22    Authorization Type Medicare & BCBS state health PPO    PT Start Time 1512    PT Stop Time 1537    PT Time Calculation (min) 25 min    Equipment Utilized During Treatment Gait belt    Activity Tolerance Patient tolerated treatment well;Patient limited by pain    Behavior During Therapy WFL for tasks assessed/performed                Past Medical History:  Diagnosis Date   CAD S/P percutaneous coronary angioplasty 08/2013   100% mRCA - PCI Integrity Resolute DES 3.0 mm x 38 mm - 3.35 mm; PTCA of RPA V 2.0 mm x 15 mm   CHF (congestive heart failure)    Cholesteatoma    right   Diabetes mellitus type 2 in obese    On insulin and Invokana   History of osteomyelitis L 5th Toe all 05/2019   s/p Partial Ray Amputation with partial closure; 6 wks Abx & LSFA Atherectomy/DEB PTA with Stent for focal dissection.   Hyperlipidemia with target LDL less than 70    Hypothyroidism (acquired)    Mild essential hypertension    Obesity (BMI 30-39.9) 11/17/2013   PAD (peripheral artery disease) 05/26/2019   05/26/19: Abd AoGram- BLE runoff -> L SFA orbital atherectomy - PTA w/ DEB & Stent 6 x 40 Luttonix (for focal  dissection) - patent Pop A with 3 V runoff. LEA Dopplers 01/03/2020: RABI (prev) 0.68 (0.53)/ RTBI (prev) 0.58 (0.33); LABI (prev) 0.80 (0.64), LTBI (prev) 0.64 (0.51); R mSFA ~50-74%, L mSFA 50-74%. Patent Prox SFA stent < 49% stenosis   Panhypopituitarism    ST elevation myocardial infarction (STEMI) of inferior wall, subsequent episode of care 08/2013   80% branch of D1, 40% mid AV groove circumflex, 100% RCA with subacute thrombus -- thrombus extending into RPA V with 100% occlusion after initial angioplasty of mid RCA ;; Post MI ECHO 6/9/'15: EF 50-55%, mild LVH with moderate HK of inferior wall, Gr1 DD, mild LA dilation; mildly reduced RV function   Past Surgical History:  Procedure Laterality Date   ABDOMINAL AORTOGRAM W/LOWER EXTREMITY N/A 05/26/2019   Procedure: ABDOMINAL AORTOGRAM W/LOWER EXTREMITY;  Surgeon: Cephus Shelling, MD;  [facility]: [facility];  Service: Cardiovascular;  Laterality: N/A;   ABDOMINAL AORTOGRAM W/LOWER EXTREMITY N/A 11/15/2020   Procedure: ABDOMINAL AORTOGRAM W/LOWER EXTREMITY;  Surgeon: Cephus Shelling, MD;  Location: [facility];  Service: Cardiovascular;  Laterality: N/A;   ABDOMINAL AORTOGRAM W/LOWER EXTREMITY N/A 04/18/2021   Procedure: ABDOMINAL AORTOGRAM W/LOWER EXTREMITY;  Surgeon: Cephus Shelling, MD;  [facility]: [facility];  Service: Cardiovascular;  Laterality:  N/A;   ABDOMINAL AORTOGRAM W/LOWER EXTREMITY Left 07/11/2021   Procedure: ABDOMINAL AORTOGRAM W/LOWER EXTREMITY;  Surgeon: Cephus Shelling, MD;  [facility]: [facility];  Service: Cardiovascular;  Laterality: Left;   AMPUTATION Left 05/25/2019   Procedure: AMPUTATION RAY 5th;  Surgeon: Vivi Barrack, DPM;  Location: [facility];  Service: Podiatry;  Laterality: Left;   AMPUTATION Left 01/14/2022   Procedure: LEFT BELOW THE KNEE AMPUTATION;  Surgeon: Terance Hart, MD;  Location: [facility] OR;  Service: Orthopedics;  Laterality: Left;  LENGTH OF SURGERY:  90 MINUTES   APPLICATION OF WOUND VAC Left 01/14/2022   Procedure: WOUND VAC PLACEMENT;  Surgeon: Terance Hart, MD;  Location: [facility] OR;  Service: Orthopedics;  Laterality: Left;   BONE BIOPSY Left 04/24/2021   Procedure: BONE BIOPSY;  Surgeon: Asencion Islam, DPM;  Location: [facility];  Service: Podiatry;  Laterality: Left;  left ankle/fibula   BONE BIOPSY Left 07/15/2021   Procedure: LEFT FOOT BONE BIOPSY;  Surgeon: Vivi Barrack, DPM;  Location: [facility];  Service: Podiatry;  Laterality: Left;   Cardiac Event Monitor  July-August 2015   Sinus rhythm with PVCs   CHOLECYSTECTOMY     COLONOSCOPY N/A 08/31/2013   Procedure: COLONOSCOPY;  Surgeon: Charna Elizabeth, MD;  Location: [facility] ENDOSCOPY;  Service: Endoscopy;  Laterality: N/A;   CORONARY STENT INTERVENTION N/A 11/19/2020   Procedure: CORONARY STENT INTERVENTION;  Surgeon: Yvonne Kendall, MD;  [facility];  Service: Cardiovascular;  Laterality: N/A;   ESOPHAGOGASTRODUODENOSCOPY N/A 09/01/2013   Procedure: ESOPHAGOGASTRODUODENOSCOPY (EGD);  Surgeon: Theda Belfast, MD;  Location: [facility] ENDOSCOPY;  Service: Endoscopy;  Laterality: N/A;  bedside   ESOPHAGOGASTRODUODENOSCOPY (EGD) WITH PROPOFOL N/A 05/26/2021   Procedure: ESOPHAGOGASTRODUODENOSCOPY (EGD) WITH PROPOFOL;  Surgeon: Jeani Hawking, MD;  Location: [facility] ENDOSCOPY;  Service: Gastroenterology;  Laterality: N/A;   EYE SURGERY Bilateral    bilateral cataracts   INCISION AND DRAINAGE Left 04/24/2021   Procedure: INCISION AND DRAINAGE;  Surgeon: Asencion Islam, DPM;  Location: [facility];  Service: Podiatry;  Laterality: Left;   IRRIGATION AND DEBRIDEMENT FOOT Left 07/15/2021   Procedure: IRRIGATION AND DEBRIDEMENT FOOT;  Surgeon: Vivi Barrack, DPM;  Location: [facility];  Service: Podiatry;  Laterality: Left;   LEFT HEART CATH AND CORONARY ANGIOGRAPHY N/A 11/19/2020   Procedure: LEFT HEART CATH AND CORONARY ANGIOGRAPHY;  Surgeon: Yvonne Kendall, MD;  [facility];  Service: Cardiovascular;  Laterality: N/A;   LEFT HEART CATHETERIZATION WITH CORONARY ANGIOGRAM N/A 08/30/2013   Procedure: LEFT HEART CATHETERIZATION WITH CORONARY ANGIOGRAM;  Surgeon: Marykay Lex, MD;  Location: [facility] CATH LAB: 100% mRCA (thrombus - extends to RPAV), 80% D1, 40% AVG Cx.   PERCUTANEOUS CORONARY STENT INTERVENTION (PCI-S)  08/30/2013   Procedure: PERCUTANEOUS CORONARY STENT INTERVENTION (PCI-S);  Surgeon: Marykay Lex, MD;  Location: [facility] CATH LAB;  Integrity Resolute DES 2.0 mm x 38 mm -- 3.35 mm.; PTCA of proximal RPA V. - 3.0 mm x 15 mm balloon   PERIPHERAL VASCULAR ATHERECTOMY  05/26/2019   Procedure: PERIPHERAL VASCULAR ATHERECTOMY;  Surgeon: Cephus Shelling, MD;  [facility];  Service: Cardiovascular;;  Left SFA   PERIPHERAL VASCULAR BALLOON ANGIOPLASTY Left 07/11/2021   Procedure: PERIPHERAL VASCULAR BALLOON ANGIOPLASTY;  Surgeon: Cephus Shelling, MD;  [facility];  Service: Cardiovascular;  Laterality: Left;  PT TRUNK / AT   PERIPHERAL VASCULAR INTERVENTION  05/26/2019   Procedure: PERIPHERAL VASCULAR INTERVENTION;  Surgeon:  Cephus Shelling, MD;  Location: [facility] INVASIVE CV LAB;  Service: Cardiovascular;;  Left SFA   PERIPHERAL VASCULAR INTERVENTION Right 11/15/2020   Procedure: PERIPHERAL VASCULAR INTERVENTION;  Surgeon: Cephus Shelling, MD;  [facility];  Service: Cardiovascular;  Laterality: Right;  Superficial Femoral Artery   PERIPHERAL VASCULAR INTERVENTION Left 07/11/2021   Procedure: PERIPHERAL VASCULAR INTERVENTION;  Surgeon: Cephus Shelling, MD;  [facility];  Service: Cardiovascular;  Laterality: Left;  SFA   PITUITARY SURGERY     TEE WITHOUT CARDIOVERSION N/A 04/26/2021   Procedure: TRANSESOPHAGEAL ECHOCARDIOGRAM (TEE);  Surgeon: Jodelle Red, MD;  Location: [facility] ENDOSCOPY;  Service: Cardiovascular;  Laterality: N/A;   TRANSTHORACIC ECHOCARDIOGRAM   08/30/2013   mild LVH. EF 50-55%. Moderate HK of the entire inferior myocardium. GR 1 DD. Mild LA dilation. Mildly reduced RV function   TYMPANOMASTOIDECTOMY Right 12/28/2017   Procedure: RIGHT TYMPANOMASTOIDECTOMY;  Surgeon: Newman Pies, MD;  Location: [facility];  Service: ENT;  Laterality: Right;   Patient Active Problem List   Diagnosis Date Noted   Nocturia more than twice per night 02/03/2022   Unilateral complete BKA, left, initial encounter 01/17/2022   Osteomyelitis 01/13/2022   Normocytic anemia 01/13/2022   Atrial fibrillation 08/15/2021   Septic arthritis of foot 08/02/2021   Medication monitoring encounter 08/02/2021   Acute on chronic systolic CHF (congestive heart failure) 06/02/2021   Hypothyroidism 06/02/2021   Ulcer of esophagus without bleeding    Pressure injury of skin 05/31/2021   Hypokalemia 05/30/2021   Symptomatic anemia 05/29/2021   AKI (acute kidney injury) 05/29/2021   GI bleed 05/29/2021   Liver lesion 05/29/2021   Vertigo 05/24/2021   Generalized weakness 05/24/2021   Tenosynovitis of left ankle 04/25/2021   Thrombocytosis 04/23/2021   Fatty liver 06/19/2020   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 06/19/2020   Atherosclerotic heart disease of native coronary artery without angina pectoris 03/20/2020   Epigastric pain 03/20/2020   Nausea 03/20/2020   Absence of toe 06/28/2019   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   PAD (peripheral artery disease) 05/26/2019   Osteomyelitis of left foot 05/24/2019   Cellulitis and abscess of foot, except toes 05/18/2019   Chronic osteomyelitis of ankle and foot 05/18/2019   Cholesteatoma 02/24/2019   Cellulitis and abscess of toe 11/09/2018   Colon cancer screening 11/30/2017   Long term (current) use of insulin 03/05/2017   Iron deficiency anemia 12/10/2014   Obesity (BMI 30-39.9) 11/17/2013   Diabetes mellitus, type 2    Essential hypertension    Right thigh pain  11/01/2013   PVC's (premature ventricular contractions) 11/01/2013   Status post insertion of drug eluting coronary artery stent to [facility] emergently and PTCA to prox. PLA 09/16/2013   Hyperlipidemia associated with type 2 diabetes mellitus 09/16/2013   Presence of coronary angioplasty implant and graft 09/08/2013   Panhypopituitarism (HCC) 08/30/2013   Non-ST elevation (NSTEMI) myocardial infarction 08/22/2013   CAD S/P percutaneous coronary angioplasty 08/22/2013    REFERRING DIAG: Z61.096 (ICD-10-CM) - Hx of BKA, left    ONSET DATE:  03/19/2022 prosthesis delivery   THERAPY DIAG:  Other abnormalities of gait and mobility  Pain in left lower leg  Unsteadiness on feet  Muscle weakness (generalized)  Other symptoms and signs involving the musculoskeletal system  Rationale for Evaluation and Treatment Rehabilitation  PERTINENT HISTORY: CAD, STEMI, CHF, PAD, A-Fib, IDDM2, severe septic arthritis   PRECAUTIONS: Fall  SUBJECTIVE:  SUBJECTIVE STATEMENT  She is really enjoying being active in community & with family.   PAIN:  Are you having pain? Yes: NPRS scale:  today  0/10 and in last week 0/10 Pain location: left distal tibia, posterior knee & distal limb.  Pain description: sore Aggravating factors: walking & standing.  Relieving factors: taking prosthesis off   Back pain lumbar: 0/10 today & in last week 6/10 walking or standing but goes away with sitting.            OBJECTIVE: (objective measures completed at initial evaluation unless otherwise dated)  COGNITION: 04/21/2022:  Overall cognitive status: Within functional limits for tasks assessed   POSTURE: 04/21/2022:  rounded shoulders, forward head, flexed trunk , and weight shift right   LOWER EXTREMITY ROM:   ROM P:passive  A:active  Left Eval 04/21/22: Left 07/14/22  Hip flexion WFL   Hip extension WFL   Hip abduction WFL   Hip adduction     Hip internal rotation     Hip external rotation     Knee flexion 90* Seated  100*  Knee extension -5* Standing  0*  Ankle dorsiflexion NA   Ankle plantarflexion NA   Ankle inversion NA   Ankle eversion NA    (Blank rows = not tested)   LOWER EXTREMITY MMT:   MMT Left Eval 04/21/22 Left 07/14/22  Hip flexion 4/5 5/5  Hip extension 3+/5 5/5  Hip abduction 3+/5 5/5  Hip adduction     Hip internal rotation     Hip external rotation     Knee flexion 3+/5 5/5  Knee extension 4-/5 5/5  Ankle dorsiflexion NA   Ankle plantarflexion NA   Ankle inversion NA   Ankle eversion NA   (Blank rows = not tested)   TRANSFERS: 07/14/2022:  sit to / from stand without UE support independently.  05/26/2022:  sit to/from stand pushing off seat of chair (no armrests needed) and stabilizes without UE support.   04/21/2022:  Sit to stand: SBA requires armrests & BUEs to push up but able to stabilize without touching RW 04/21/2022:  Stand to sit: SBA requires armrests & BUEs to control descent.    GAIT: 07/14/2022:  pt amb 250' with SBQC & >300' with rollator walker independently.   Gait Velocity: 1.91 ft/sec with SBQC  Pt neg ramp & curb with rollator walker independently & with [facility] directing HHA safely.   05/28/2022: gait velocity with SBQC 1.34 ft/sec.  05/26/2022: pt amb 61' with SBQC with minA.  Pt amb >300' with RW or rollator walker safely.   04/21/2022:  Gait pattern: step to pattern, decreased step length- Right, decreased stance time- Left, decreased hip/knee flexion- Left, circumduction- Left, Left hip hike, knee flexed in stance- Left, antalgic, lateral hip instability, trunk flexed, and abducted- Left Distance walked: 39' Assistive device utilized: Environmental consultant - 2 wheeled and TTA prosthesis Level of assistance: SBA  verbal cues only with RW Comments: excessive UE weight bearing  on RW   FUNCTIONAL TESTs:  07/09/2022: Sharlene Motts Balance 43/56  07/02/2022:  Timed Up & Go with SBQC Standard 16.88sec; manual TUG 18.65 sec (right knee effected sit to stand); cognitive (recipe) TUG 17.97 sec  06/16/2022:  Berg Balance 36/56  05/26/2022: Timed Up & Go Standard 24.32sec; manual TUG 26.87 sec; cognitive (recipe) TUG 30.47 sec  05/22/2022:  Berg Balance 29/56  04/28/2022:  PT assessed vertigo:  Charlesetta Ivory  - left cervical rotation sit to supine no nystagmus, right rotation no  dizziness and rolling right. -right cervical rotation sit to supine no nystagmus, left rotation slight dizziness and rolling left slight dizziness. Austin Miles To right light dizziness sit to supine & severe dizziness sitting up. To left light dizziness sit to supine & severe dizziness sitting up.  Orthostatic Hypotension Supine no dizziness  145/70 HR 80  Standing light dizziness 119/67 HR 108 After standing 3 min  no dizziness BP 158/76 HR 112 Positive for orthostatic Hypotension  04/21/2022: Dizziness with head motions.  Berg Balance Scale: 19/56    CURRENT PROSTHETIC WEAR ASSESSMENT:  07/14/2022:  Patient is independent with: prosthetic cleaning, skin check, residual limb care, correct ply sock adjustment, proper wear schedule/adjustment, and proper weight-bearing schedule/adjustment Pt tolerates prosthesis >90% of awake hours without issues.     04/21/2022: Patient is independent with: prosthetic cleaning Patient is dependent with: skin check, residual limb care, correct ply sock adjustment, proper wear schedule/adjustment, and proper weight-bearing schedule/adjustment Donning prosthesis: SBA Doffing prosthesis: Modified independence Prosthetic wear tolerance: ~14 hours total between 2x/day, 7 days/week Prosthetic weight bearing tolerance: 5 minutes with partial weight on prosthesis with limb pain 8/10 Edema: pitting Residual limb condition: no open areas but one small area distal tibia that  may be internal suture working out,  shiny skin with no hair growth, redness over patella & distal tibia, no temperature issues noted. Cylindrical shape Prosthetic description: silicon liner with pin lock suspension, total contact socket with flexible inner socket, dynamic response foot/ankle       TODAY'S TREATMENT:                                                                                                                             DATE:  07/14/2022: Prosthetic Training with Transtibial Prosthesis: See objective data above. Pt questioning how to hold umbrella while ambulating. PT had her hold 1# rod similar position to umbrella. She amb 36' with SBQC safely. PT demo & verbal cues how to have person providing HHA for ramp & curb hold umbrella. She verbalized understanding.  PT instructed in traveling with prosthesis.  She verbalized understanding.  Pt verbalized understanding of ongoing HEP & need to continue to maintain or increase current functional level.   07/09/2022: Prosthetic Training with Transtibial Prosthesis: PT demo & verbal cues on side stepping up /down ramp that is steep.   PT demo & verbal cues on floor transfer upon pt request.  She attempted using chair bottom but her left knee would not allow.  Physical Performance Testing. See objective for Berg Balance  Self-care: Upon pt request PT instructed in shoe design for diabetics.  Pt verbalized understanding with plans to have feet analyzed at [facility] for guide for shoes.      07/02/2022: Prosthetic Training with Transtibial Prosthesis: See objective for TUG scores which have improved.  Pt sweeping & mopping without device except broom or mop for balance. Pt cues on offset feet with weight shift for functional activity. PT &  pt discussed functional activities like laundry and baking that she can now perform without issues including dizziness.  Pt amb 75' X 2 with SBQC with SBA. Patient able to direct PT how to  assist with negotiating 6 inch curb and 12* incline/ramp with SBQC.      HOME EXERCISE PROGRAM: Access Code: [email or web address] URL: [email or web address] Date: 06/02/2022 Prepared by: Vladimir Faster  Exercises - wide stance head motions eyes open  - 1 x daily - 4 x weekly - 1 sets - 10 reps - 2 seconds hold - Feet Apart with Eyes Closed with Head Motions  - 1 x daily - 4 x weekly - 1 sets - 10 reps - 2 seconds hold - stand on foam eyes open   - 1 x daily - 4 x weekly - 1 sets - 10 reps - 2 seconds hold - Alternating Punch with Resistance  - 1 x daily - 3-4 x weekly - 1 sets - 3-5 reps - Standing Scapular Protraction with Resistance  - 1 x daily - 3-4 x weekly - 1 sets - 3-5 reps - Standing alternate rows with resistance  - 1 x daily - 3-4 x weekly - 1 sets - 3-5 reps - Standing Row with Anchored Resistance  - 1 x daily - 3-4 x weekly - 1 sets - 3-5 reps - Alternating elbow flexion with resistance  - 1 x daily - 3-4 x weekly - 1 sets - 3-5 reps - Standing Bicep Curls with Resistance  - 1 x daily - 3-4 x weekly - 1 sets - 3-5 reps    Do each exercise 5-10 repetitions Hold each exercise for 2 seconds to feel your location  AT SINK FIND YOUR MIDLINE POSITION AND PLACE FEET EQUAL DISTANCE FROM THE MIDLINE.  Try to find this position when standing still for activities.   USE TAPE ON FLOOR TO MARK THE MIDLINE POSITION which is even with middle of sink.  You also should try to feel with your limb pressure in socket.  You are trying to feel with limb what you used to feel with the bottom of your foot.  Side to Side Shift: Moving your hips only (not shoulders): move weight onto your left leg, HOLD/FEEL pressure in socket.  Move back to equal weight on each leg, HOLD/FEEL pressure in socket. Move weight onto your right leg, HOLD/FEEL pressure in socket. Move back to equal weight on each leg, HOLD/FEEL pressure in socket. Repeat.  Start with both hands on sink, progress to hand on prosthetic  side only, then no hands.  Front to Back Shift: Moving your hips only (not shoulders): move your weight forward onto your toes, HOLD/FEEL pressure in socket. Move your weight back to equal Flat Foot on both legs, HOLD/FEEL  pressure in socket. Move your weight back onto your heels, HOLD/FEEL  pressure in socket. Move your weight back to equal on both legs, HOLD/FEEL  pressure in socket. Repeat.  Start with both hands on sink, progress to hand on prosthetic side only, then no hands.  Moving Cones / Cups: With equal weight on each leg: Hold on with one hand the first time, then progress to no hand supports. Move cups from one side of sink to the other. Place cups ~2" out of your reach, progress to 10" beyond reach.  Place one hand in middle of sink and reach with other hand. Do both arms.  Then hover one hand and move cups with other hand.  Overhead/Upward Reaching:  alternated reaching up to top cabinets or ceiling if no cabinets present. Keep equal weight on each leg. Start with one hand support on counter while other hand reaches and progress to no hand support with reaching.  ace one hand in middle of sink and reach with other hand. Do both arms.  Then hover one hand and move cups with other hand.  5.   Looking Over Shoulders: With equal weight on each leg: alternate turning to look over your shoulders with one hand support on counter as needed.  Start with head motions only to look in front of shoulder, then even with shoulder and progress to looking behind you. To look to side, move head /eyes, then shoulder on side looking pulls back, shift more weight to side looking and pull hip back. Place one hand in middle of sink and let go with other hand so your shoulder can pull back. Switch hands to look other way.   Then hover one hand and look over shoulder. If looking right, use left hand at sink. If looking left, use right hand at sink. 6.  Stepping with leg that is not amputated:  Move items under cabinet out  of your way. Shift your hips/pelvis so weight on prosthesis. Tighten muscles in hip on prosthetic side.  SLOWLY step other leg so front of foot is in cabinet. Then step back to floor.   Back & standing endurance HEP on 05/12/22 Stand at sink with chair back on your right & left and w/c or locked rollator behind you.  Place back of pelvis against counter with feet under your pelvis.  Rest your hands on counter beside you.  Time how long you can stand upright.  Try to increase time over days & weeks.  Stand facing w/c lean forward placing both hands in seat & stand back up straight, then lean forward hovering hands over seat Facing sink: Rotate upper body to place item on back of each chair on right & left. Side bend placing water bottle in each seat. Reach over head following hands with eyes.     ASSESSMENT:  CLINICAL IMPRESSION: Pt met all LTGs. She appears to be safely functioning with her prosthesis at community level.  She is very pleased with improvement in functional ability.      OBJECTIVE IMPAIRMENTS: Abnormal gait, decreased activity tolerance, decreased balance, decreased endurance, decreased knowledge of condition, decreased knowledge of use of DME, decreased mobility, difficulty walking, decreased ROM, decreased strength, increased edema, impaired flexibility, postural dysfunction, prosthetic dependency , obesity, and pain.    ACTIVITY LIMITATIONS: carrying, lifting, bending, standing, stairs, transfers, and locomotion level   PARTICIPATION LIMITATIONS: meal prep, cleaning, laundry, driving, community activity, and church   PERSONAL FACTORS: Fitness, Time since onset of injury/illness/exacerbation, and 3+ comorbidities: see PMH  are also affecting patient's functional outcome.    REHAB POTENTIAL: Good   CLINICAL DECISION MAKING: Evolving/moderate complexity   EVALUATION COMPLEXITY: Moderate     GOALS: Goals reviewed with patient? Yes   SHORT TERM GOALS: Target date:  06/20/2022   Patient verbalizes how to adjust ply socks with limb volume changes. Baseline: SEE OBJECTIVE DATA Goal status: MET 06/16/2022 2.  Patient tolerates prosthesis >90% awake hrs /day without skin issues or limb pain <2/10 after standing. Baseline: SEE OBJECTIVE DATA Goal status: MET 06/16/2022   3. Berg Balance >/= 36/56. Baseline: SEE OBJECTIVE DATA Goal status:  MET 06/16/2022   4. Patient ambulates 58' with [facility] & prosthesis with supervision. Baseline:  SEE OBJECTIVE DATA Goal status: MET 06/16/2022    LONG TERM GOALS: Target date: 07/17/2022   Patient demonstrates & verbalized understanding of prosthetic care to enable safe utilization of prosthesis. Baseline: SEE OBJECTIVE DATA Goal status: MET 07/09/2022   Patient tolerates prosthesis wear >90% of awake hours without skin or limb pain issues. Baseline: SEE OBJECTIVE DATA Goal status: MET 07/09/2022   Berg Balance >36/56 to indicate lower fall risk Baseline: SEE OBJECTIVE DATA Goal status: MET 07/09/2022   Patient ambulates >300' with prosthesis & LRAD independently Baseline: SEE OBJECTIVE DATA Goal status: MET 07/14/2022   Patient negotiates ramps, curbs with LRAD & stairs with single rail with prosthesis independently. Baseline: SEE OBJECTIVE DATA Goal status:   MET 07/14/2022   Patient verbalizes & demonstrates understanding of HEP Baseline: SEE OBJECTIVE DATA Goal status:  MET 07/14/2022   PLAN:   PT FREQUENCY: 2x/week   PT DURATION: 12 weeks   PLANNED INTERVENTIONS: Therapeutic exercises, Therapeutic activity, Neuromuscular re-education, Balance training, Gait training, Patient/Family education, Self Care, Stair training, Vestibular training, Canalith repositioning, Prosthetic training, DME instructions, and physical performance training   PLAN FOR NEXT SESSION:  discharge PT   Vladimir Faster, PT, DPT 07/14/2022, 3:58 PM

## 2022-07-20 IMAGING — MR MR ANKLE*L* WO/W CM
4 of 8 series · 18 of 40 positions shown · IV contrast (gadavist)
Comparison: Left ankle x-rays from same day. MRI left foot and
ankle dated April 23, 2021.

CLINICAL DATA: Left ankle wound. Worsening lateral ankle swelling
over the past 2 days.

EXAM:
MRI OF THE LEFT ANKLE WITHOUT AND WITH CONTRAST
TECHNIQUE: Multiplanar, multisequence MR imaging of the ankle was performed
before and after the administration of intravenous contrast.
CONTRAST:  8.6mL GADAVIST GADOBUTROL 1 MMOL/ML IV SOLN

[Series 3: T2 fat-sat · axial · 3.0mm · 0.29mm/px · z∈[-60,+96]mm · 6 of 40 slices shown (1 of 2)]
[im 1/40]
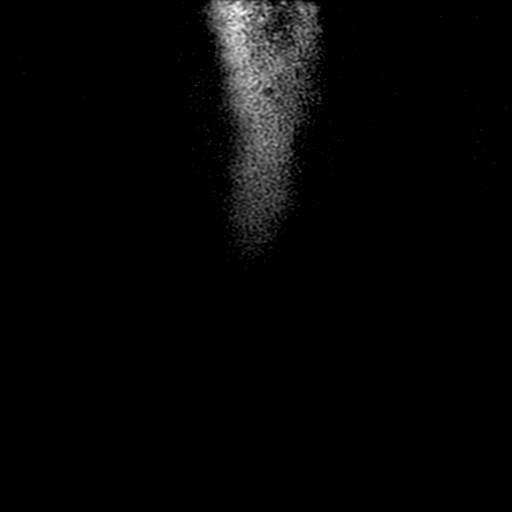
[im 8/40]
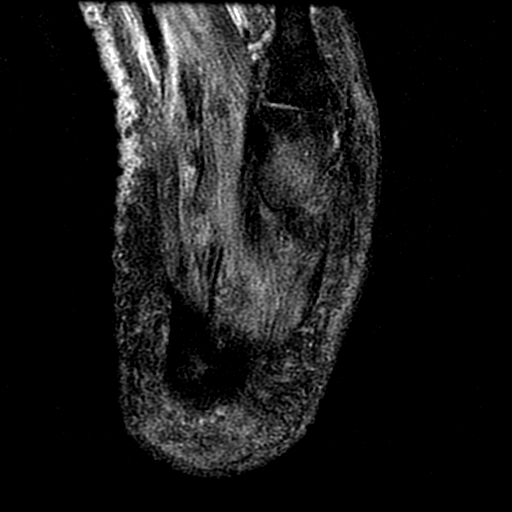
[im 16/40]
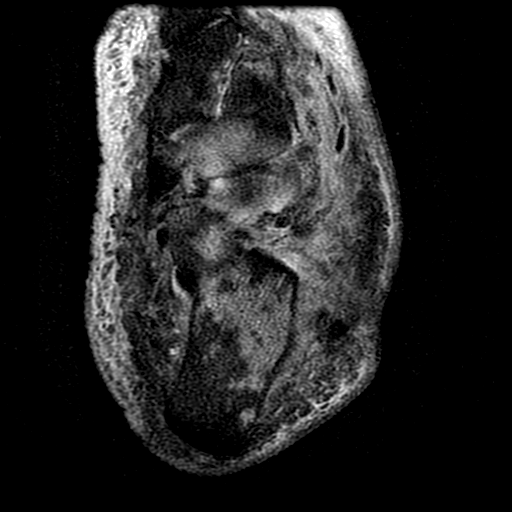
[im 24/40]
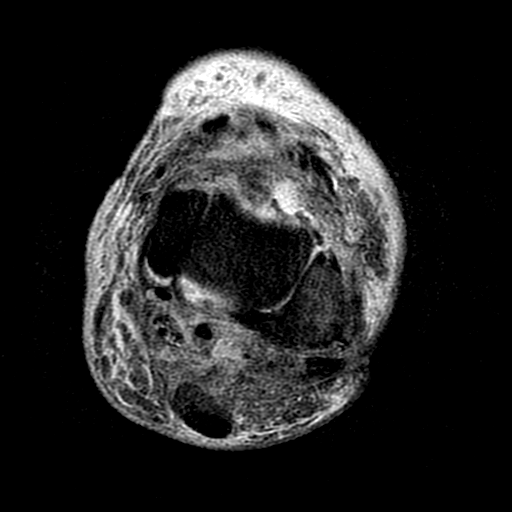
[im 32/40]
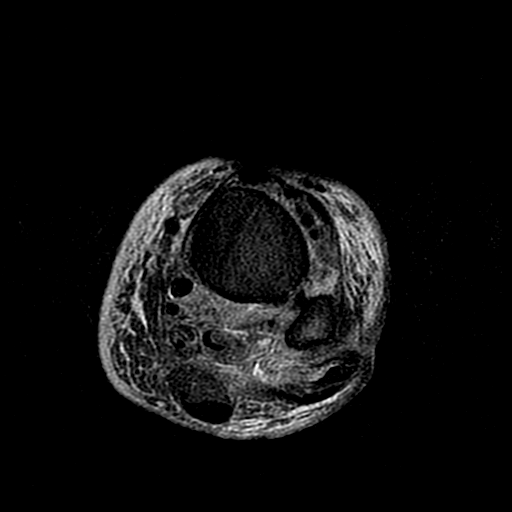
[im 40/40]
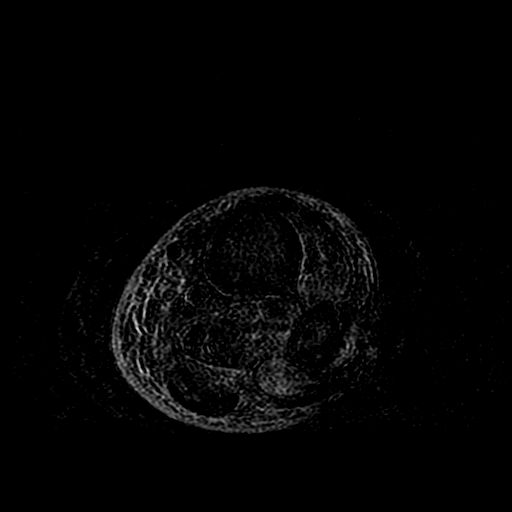

[Series 4: PD fat-sat · axial · 3.0mm · 0.29mm/px · z∈[-60,+96]mm · 6 of 40 slices shown]
[im 1/40]
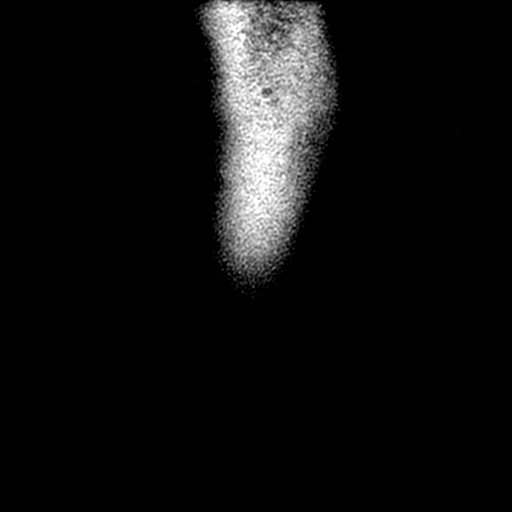
[im 8/40]
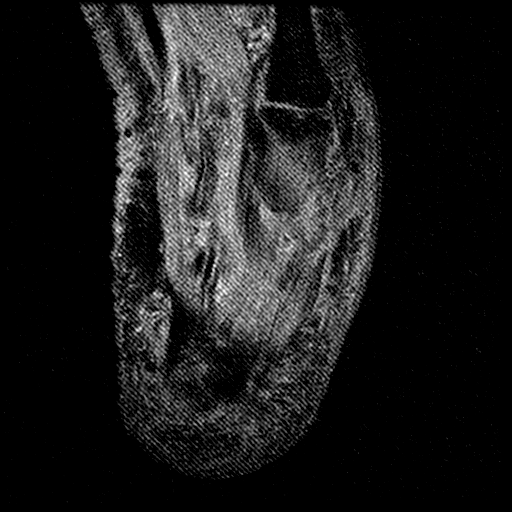
[im 16/40]
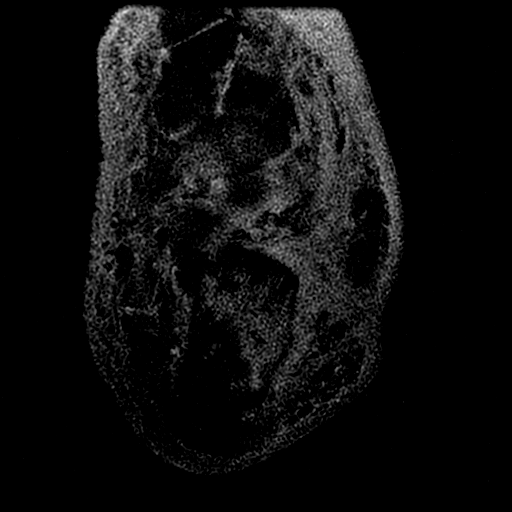
[im 24/40]
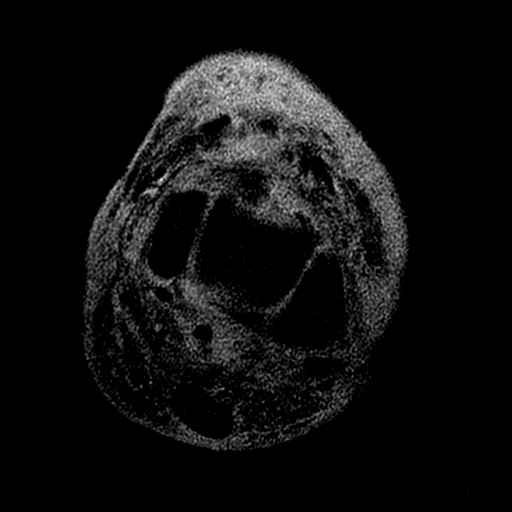
[im 32/40]
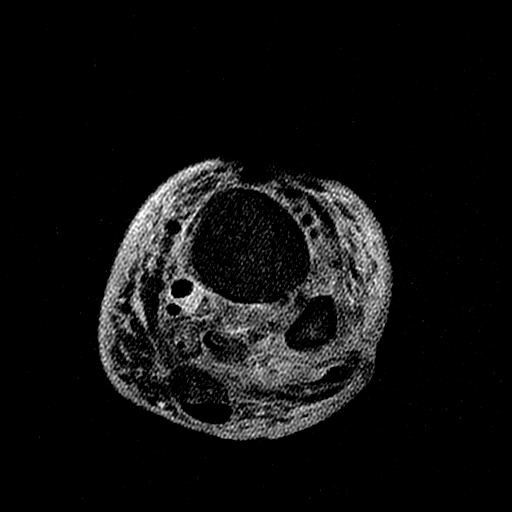
[im 40/40]
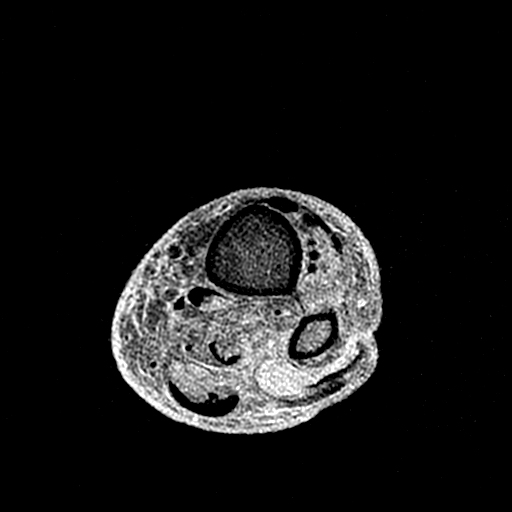

[Series 5: T2 fat-sat · coronal · 3.0mm · 0.31mm/px · 3 of 33 slices shown (2 of 2)]
[im 1/33]
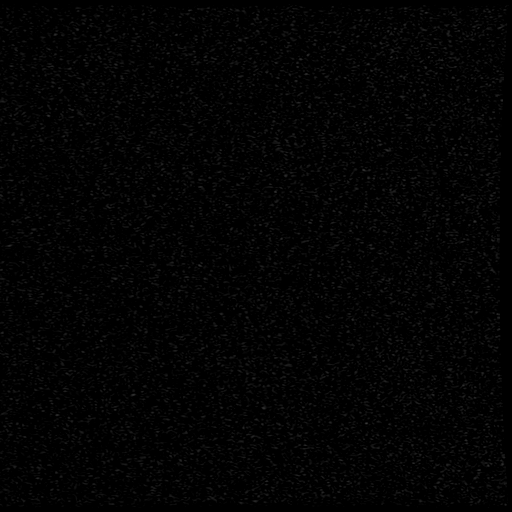
[im 17/33]
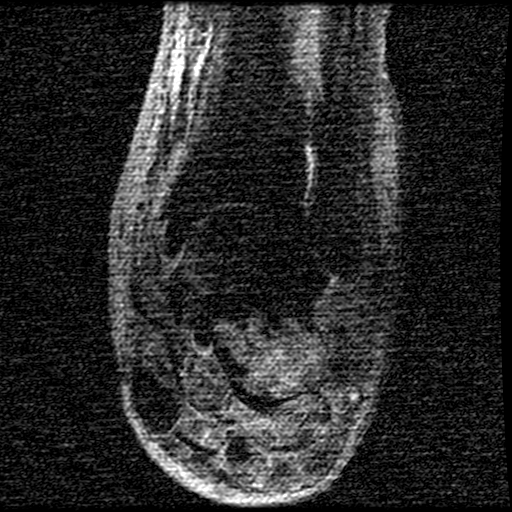
[im 33/33]
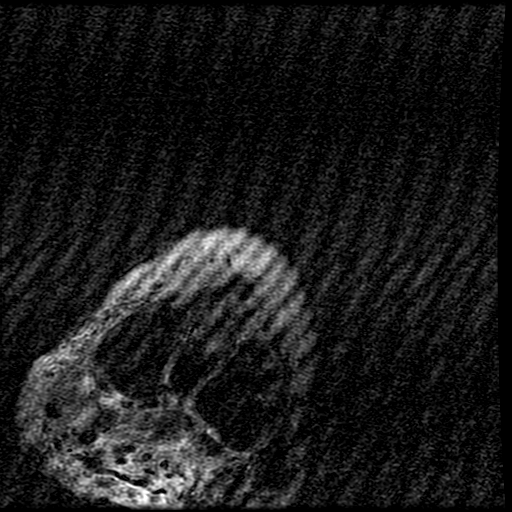

[Series 6: T1 · sagittal · 4.0mm · 0.31mm/px · 3 of 23 slices shown]
[im 1/23]
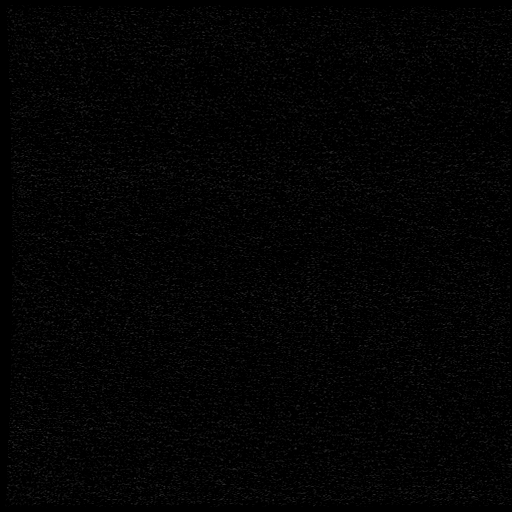
[im 12/23]
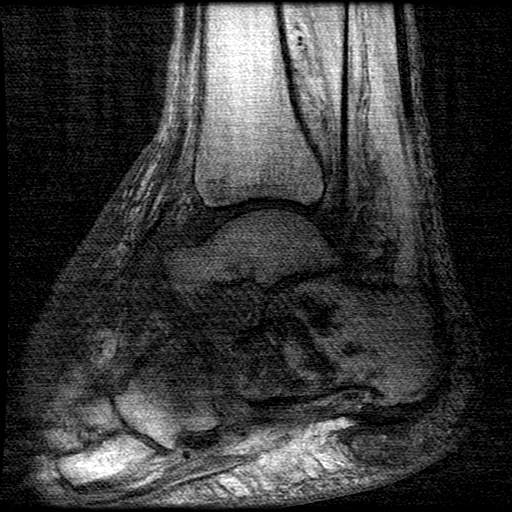
[im 23/23]
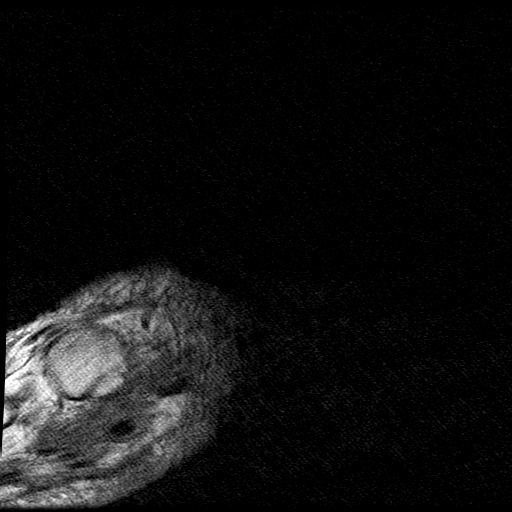

[18 of 40 positions shown; findings below may reference images not displayed]

FINDINGS: Despite efforts by the technologist and patient, motion artifact is
present on today's exam and could not be eliminated. This reduces
exam sensitivity and specificity.

TENDONS

Peroneal: Similar tendinosis and partial tears of the peroneal
longus and brevis tendons with resolved fluid in the tendon sheath.

Posteromedial: Posterior tibial tendon intact. Flexor digitorum
longus tendon intact. Flexor hallucis longus tendon intact.

Anterior: Tibialis anterior tendon intact. Extensor hallucis longus
tendon intact. Extensor digitorum longus tendon intact.

Achilles:  Intact.

Plantar Fascia: Unchanged thickening of the proximal central band
with small partial tear.

LIGAMENTS

Lateral: Anterior talofibular ligament intact. Calcaneofibular
ligament intact. Posterior talofibular ligament intact. Anterior and
posterior tibiofibular ligaments intact.

Medial: Deltoid ligament intact. Spring ligament intact.

CARTILAGE

Ankle Joint: Unchanged small joint effusion. Normal ankle mortise.
No chondral defect.

Subtalar Joints/Sinus Tarsi: Normal subtalar joints. Decreased
posterior subtalar joint effusion. Progressive loss of the normal
fat within the sinus tarsi.

Bones: Progressive now more confluent T2 marrow signal abnormality
within the calcaneus with corresponding more decreased T1 marrow
signal compared to prior. New similar marrow signal abnormality
within the cuboid, talar head, and navicular. New calcaneocuboid
joint effusion with thick synovial enhancement and bulging of the
joint capsule. Increased small talonavicular joint effusion with
thin synovial enhancement. Marrow edema in the distal fibula has
resolved. No acute fracture or dislocation.

Soft Tissue: Progressive diffuse severe soft tissue swelling of the
ankle. Small skin ulceration along the lateral distal lower leg. No
fluid collection
IMPRESSION: IMPRESSION
1. New osteomyelitis of the cuboid, talar head, and navicular.
Progressive osteomyelitis of the calcaneus. New calcaneocuboid and
talonavicular septic arthritis.
2. Progressive diffuse severe soft tissue swelling of the ankle. No
abscess.
3. Resolved peroneal tenosynovitis.

## 2022-07-20 IMAGING — CR DG ANKLE 2V *L*
2 series · 2 of 2 positions shown · non-contrast
Comparison: Left ankle series 06/25/2021.

CLINICAL DATA: 71-year-old female with cellulitis, swelling. MRSA.

EXAM:
LEFT ANKLE - 2 VIEW

[ankle ap]
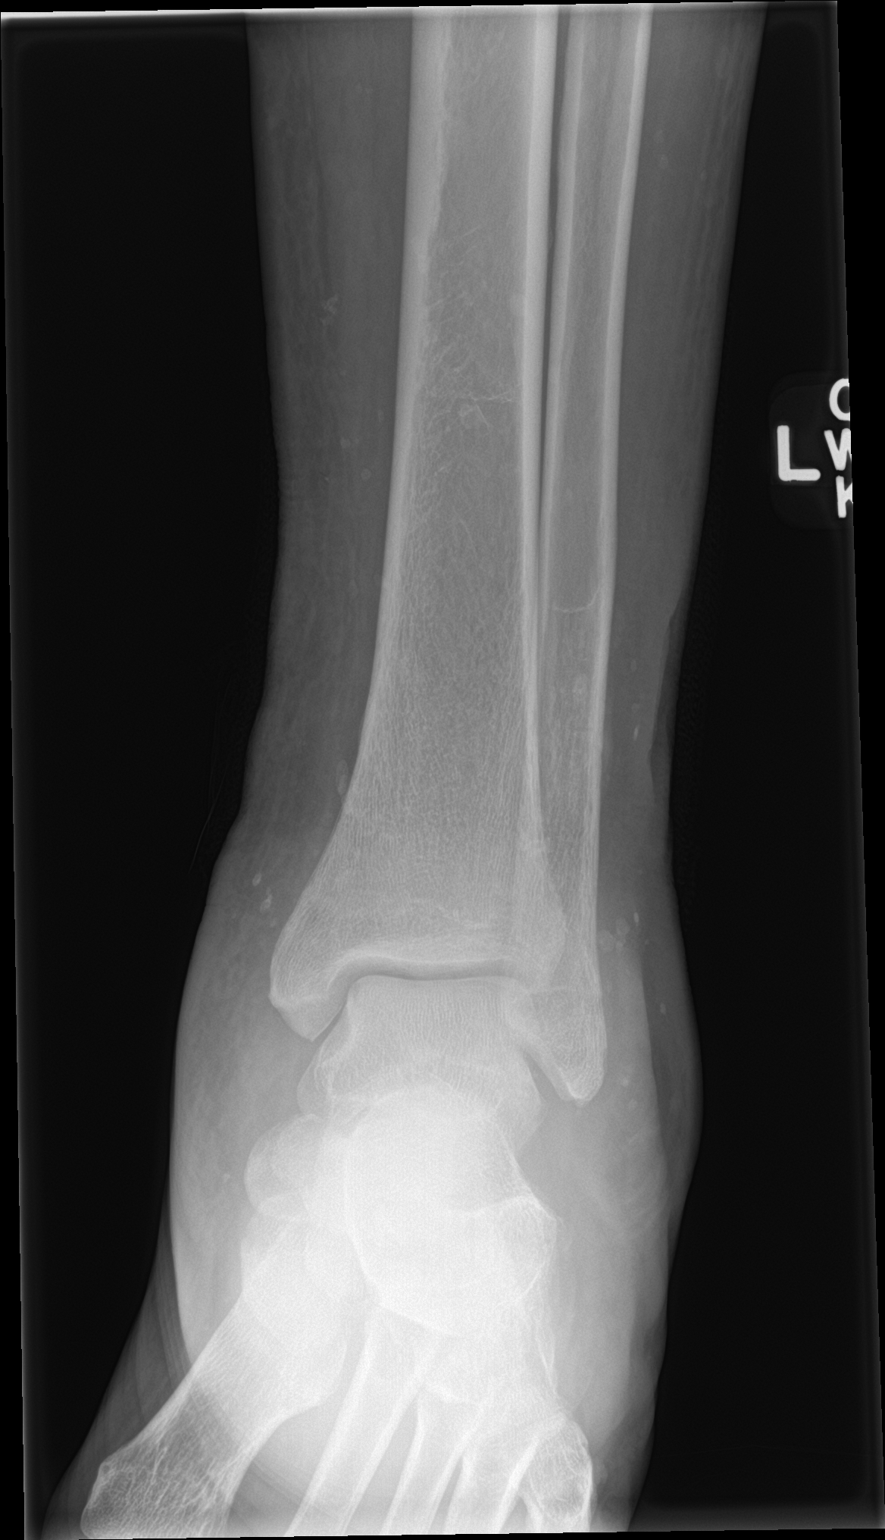

[ankle lat]
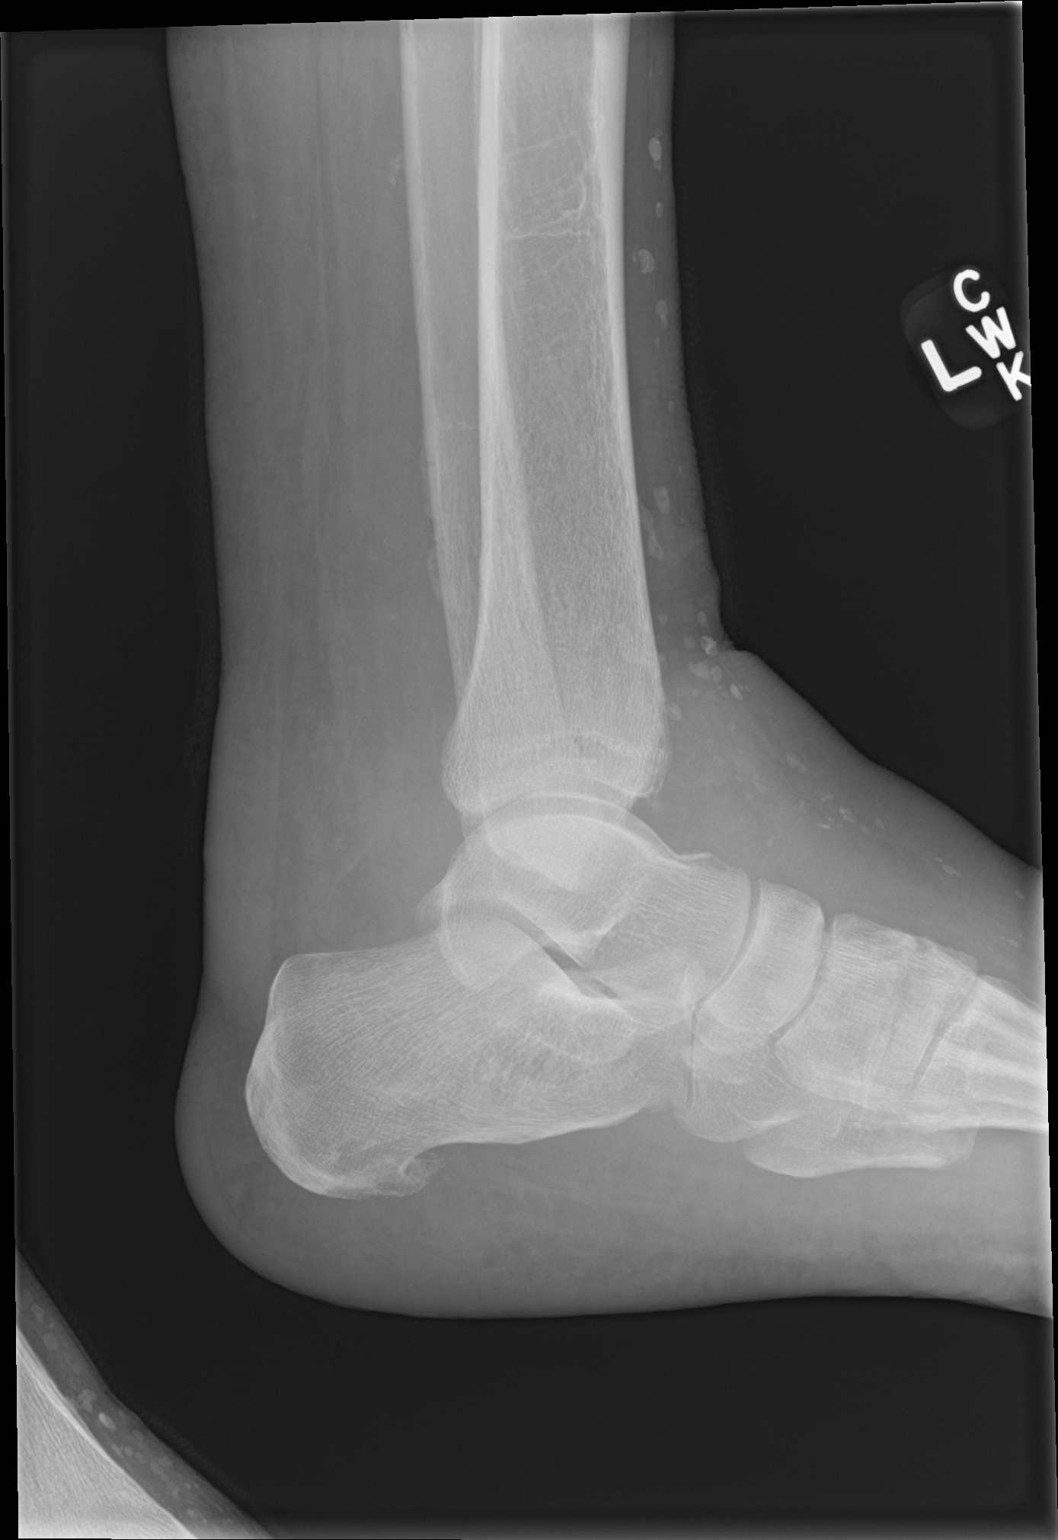

[2 of 2 positions shown; findings below may reference images not displayed]

FINDINGS: Preserved mortise joint alignment. Evidence of previous 5th
metatarsal amputation. No acute osseous abnormality identified. No
definite joint effusion. Generalized soft tissue swelling.
Dystrophic soft tissue calcifications. No soft tissue gas.
IMPRESSION: Generalized soft tissue swelling with no acute osseous abnormality
identified.

## 2022-07-28 IMAGING — DX DG FOOT COMPLETE 3+V*L*
2 series · 2 of 2 positions shown · non-contrast
Comparison: June 25, 2021

CLINICAL DATA: Status post bone biopsy, irrigation and debridement
of foot

EXAM:
LEFT FOOT - COMPLETE 2 VIEW

[foot]
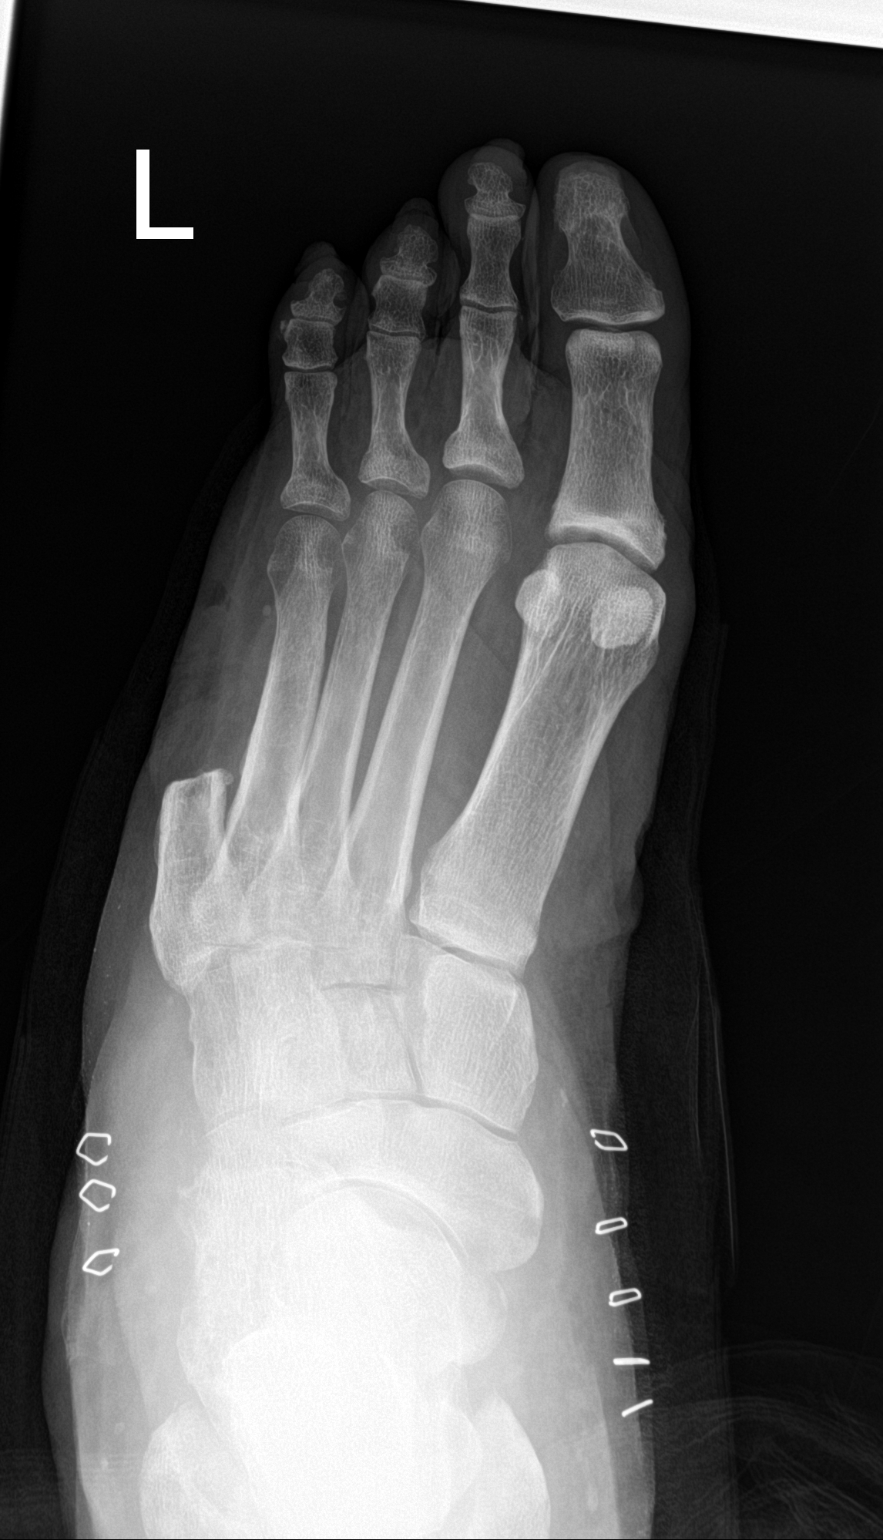

[leg]
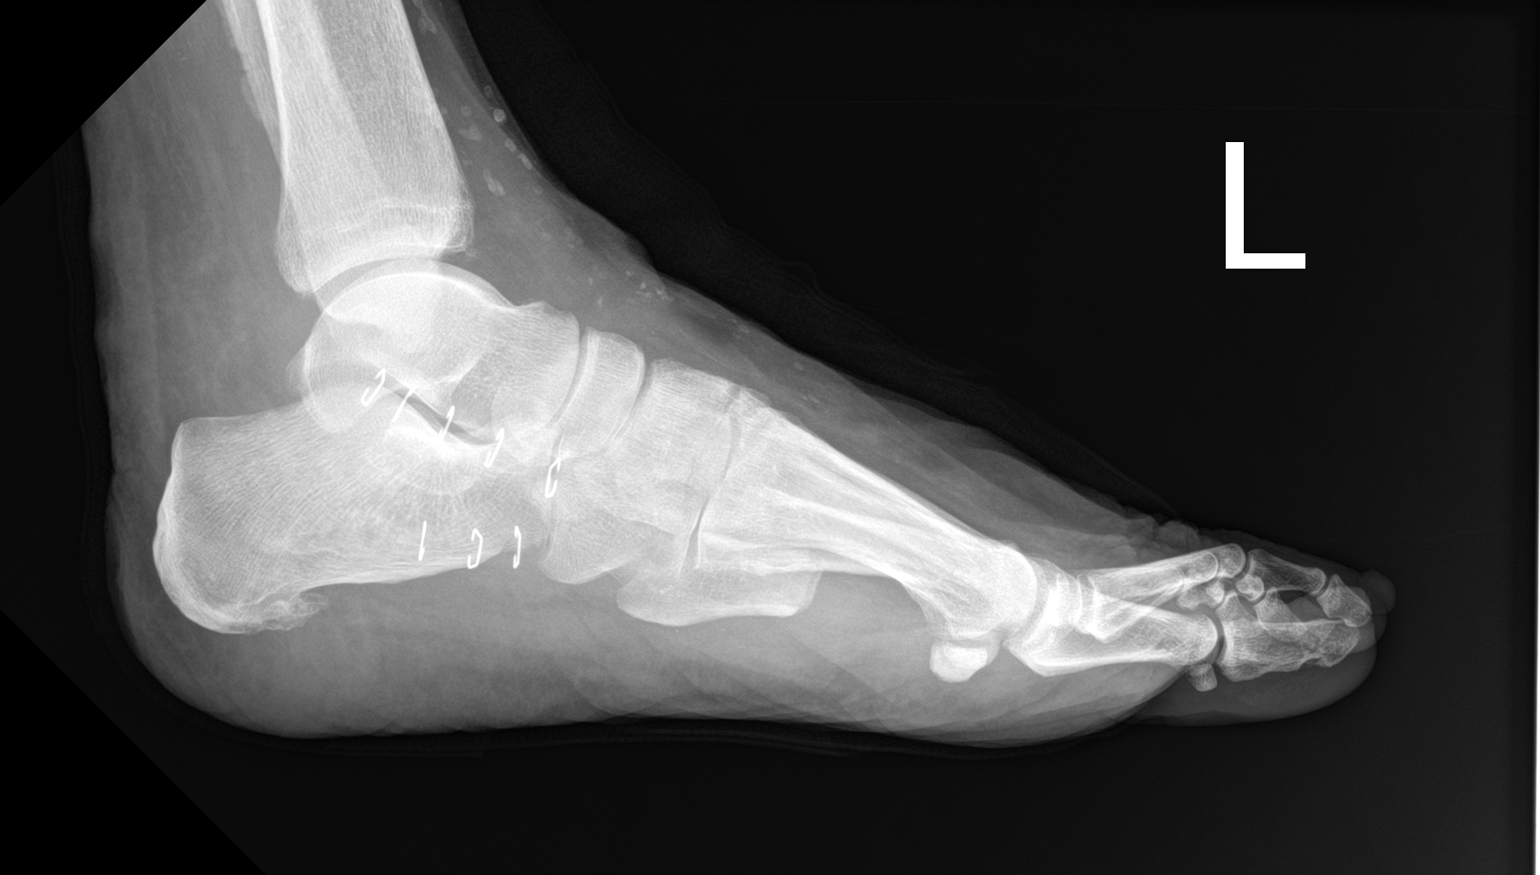

[2 of 2 positions shown; findings below may reference images not displayed]

FINDINGS: As before, again seen is the post amputation changes at the distal
[DATE] of the left fifth metatarsal/digit. Distal soft tissue swelling
and small pockets of soft tissue emphysema seen at the mid to distal
lateral foot. Prominent bony spur at the plantar calcaneus.
Remainder of the metatarsals and the tarsal/intertarsal joints have
a normal appearance.
IMPRESSION: Status post amputation changes of the distal [DATE] of the left fifth
metatarsal. Soft tissue emphysema.

## 2022-08-07 ENCOUNTER — Other Ambulatory Visit: Payer: Self-pay | Admitting: Cardiology

## 2022-08-29 ENCOUNTER — Ambulatory Visit (INDEPENDENT_AMBULATORY_CARE_PROVIDER_SITE_OTHER): Payer: Medicare Other | Admitting: Podiatry

## 2022-08-29 DIAGNOSIS — L84 Corns and callosities: Secondary | ICD-10-CM

## 2022-08-29 DIAGNOSIS — Z7901 Long term (current) use of anticoagulants: Secondary | ICD-10-CM | POA: Diagnosis not present

## 2022-08-29 DIAGNOSIS — I739 Peripheral vascular disease, unspecified: Secondary | ICD-10-CM

## 2022-08-29 DIAGNOSIS — B351 Tinea unguium: Secondary | ICD-10-CM

## 2022-08-29 DIAGNOSIS — E1149 Type 2 diabetes mellitus with other diabetic neurological complication: Secondary | ICD-10-CM

## 2022-08-29 DIAGNOSIS — M79675 Pain in left toe(s): Secondary | ICD-10-CM

## 2022-08-29 DIAGNOSIS — M79674 Pain in right toe(s): Secondary | ICD-10-CM

## 2022-08-29 MED ORDER — MUPIROCIN 2 % EX OINT: 1.0000 | TOPICAL_OINTMENT | Freq: Two times a day (BID) | CUTANEOUS | 2 refills | Status: DC

## 2022-08-29 NOTE — Progress Notes (Signed)
Subjective: Chief Complaint  Patient presents with   Nail Problem    Rm 11 RFC right foot nail trim. Pt states she cracked her great nail 2 weeks ago and pulled the nail out. Pt wanted Dr. Ardelle Anton to check to make sure she got all the nail out.    72 year old female presents For above concerns.  She said that she is cut her right big toenail about 2 weeks ago she pulled out a piece of nail.  She does not see any drainage or pus to the area.  She also has a callus on the side of the foot that she would like to have checked.  She does not report any open lesions.  Objective: AAO x3, NAD Foot is warm and well-perfused. Left BKA  On the right side the nails are hypertrophic, dystrophic with brown discoloration.  On the medial aspect of the right hallux nail there is some hyperkeratotic tissue and slight edema there is no erythema, drainage or pus.  No ascending cellulitis.  No clinical signs of infection noted otherwise. Dry skin present on the submetatarsal base right foot without any underlying ulceration drainage or signs of infection. No pain with calf compression, swelling, warmth, erythema  Assessment: 72 year old female with symptomatic onychosis, ingrown toenail right hallux; callus  Plan: -All treatment options discussed with the patient including all alternatives, risks, complications.  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  However, there is no open lesions. -Postoperatively the nails x 5 without any complications or bleeding.  Unable debride some of the callus that had formed on the side of the left hallux toenail without any complications or bleeding.  No drainage or pus or signs of infection.  Recommended mupirocin ointment dressing changes daily.  If there is any wound or signs of infection refer back to vascular -Debrided callus on the right foot any complications or bleeding.  Moisturizer.  Vivi Barrack DPM    -Patient  encouraged to call the office with any questions, concerns, change in symptoms.     Left BKA Medial inflammed right hallux

## 2022-08-29 NOTE — Patient Instructions (Signed)
Monitor for any signs of infection. If you have any increase in swelling, redness, drainage or signs of infection, let me know.

## 2022-09-11 ENCOUNTER — Other Ambulatory Visit: Payer: Self-pay | Admitting: Cardiology

## 2022-09-15 ENCOUNTER — Telehealth: Payer: Self-pay | Admitting: Cardiology

## 2022-09-15 ENCOUNTER — Other Ambulatory Visit: Payer: Self-pay | Admitting: Cardiology

## 2022-09-15 DIAGNOSIS — I480 Paroxysmal atrial fibrillation: Secondary | ICD-10-CM

## 2022-09-15 NOTE — Telephone Encounter (Signed)
  *  STAT* If patient is at the pharmacy, call can be transferred to refill team.   1. Which medications need to be refilled? (please list name of each medication and dose if known) eliquis and plavix   2. Which pharmacy/location (including street and city if local pharmacy) is medication to be sent to?[id number] - [place], [place] - [address]   3. Do they need a 30 day or 90 day supply? 90 days   Pt is following up refill request. Pt is out of medications

## 2022-09-16 MED ORDER — APIXABAN 5 MG PO TABS
5.0000 mg | ORAL_TABLET | Freq: Two times a day (BID) | ORAL | 1 refills | Status: DC
Start: 2022-09-16 — End: 2023-03-11

## 2022-09-16 NOTE — Telephone Encounter (Signed)
Eliquis 5mg  refill request received. Patient is 72 years old, weight-87kg, Crea-0.62 on 01/27/22, Diagnosis-Afib, and last seen by Dr. Herbie Baltimore on 02/26/22. Dose is appropriate based on dosing criteria. Will send in refill to requested pharmacy.

## 2022-09-16 NOTE — Telephone Encounter (Signed)
Reviewed chart and saw that Plavix was sent over with a fifteen supply.   Additional refill of Plavix was not sent to the pharmacy at this time.   Message forwarded to anticoag pool for Eliquis.

## 2022-09-18 NOTE — Progress Notes (Unsigned)
Cardiology Clinic Note   Patient Name: Tammy Good Date of Encounter: 09/22/2022  Primary Care Provider:  Default, Provider, MD Primary Cardiologist:  Bryan Lemma, MD  Patient Profile    Tammy Good 72 year old female presents the clinic today for follow-up evaluation of her coronary artery disease and PVCs.  Past Medical History    Past Medical History:  Diagnosis Date   CAD S/P percutaneous coronary angioplasty 08/2013   100% mRCA - PCI Integrity Resolute DES 3.0 mm x 38 mm - 3.35 mm; PTCA of RPA V 2.0 mm x 15 mm   CHF (congestive heart failure) (HCC)    Cholesteatoma    right   Diabetes mellitus type 2 in obese    On insulin and Invokana   History of osteomyelitis L 5th Toe all 05/2019   s/p Partial Ray Amputation with partial closure; 6 wks Abx & LSFA Atherectomy/DEB PTA with Stent for focal dissection.   Hyperlipidemia with target LDL less than 70    Hypothyroidism (acquired)    Mild essential hypertension    Obesity (BMI 30-39.9) 11/17/2013   PAD (peripheral artery disease) (HCC) 05/26/2019   05/26/19: Abd AoGram- BLE runoff -> L SFA orbital atherectomy - PTA w/ DEB & Stent 6 x 40 Luttonix (for focal dissection) - patent Pop A with 3 V runoff. LEA Dopplers 01/03/2020: RABI (prev) 0.68 (0.53)/ RTBI (prev) 0.58 (0.33); LABI (prev) 0.80 (0.64), LTBI (prev) 0.64 (0.51); R mSFA ~50-74%, L mSFA 50-74%. Patent Prox SFA stent < 49% stenosis   Panhypopituitarism (HCC)    ST elevation myocardial infarction (STEMI) of inferior wall, subsequent episode of care (HCC) 08/2013   80% branch of D1, 40% mid AV groove circumflex, 100% RCA with subacute thrombus -- thrombus extending into RPA V with 100% occlusion after initial angioplasty of mid RCA ;; Post MI ECHO 6/9/'15: EF 50-55%, mild LVH with moderate HK of inferior wall, Gr1 DD, mild LA dilation; mildly reduced RV function   Past Surgical History:  Procedure Laterality Date   ABDOMINAL AORTOGRAM W/LOWER EXTREMITY N/A  05/26/2019   Procedure: ABDOMINAL AORTOGRAM W/LOWER EXTREMITY;  Surgeon: Cephus Shelling, MD;  [facility]: [facility];  Service: Cardiovascular;  Laterality: N/A;   ABDOMINAL AORTOGRAM W/LOWER EXTREMITY N/A 11/15/2020   Procedure: ABDOMINAL AORTOGRAM W/LOWER EXTREMITY;  Surgeon: Cephus Shelling, MD;  [facility]: [facility];  Service: Cardiovascular;  Laterality: N/A;   ABDOMINAL AORTOGRAM W/LOWER EXTREMITY N/A 04/18/2021   Procedure: ABDOMINAL AORTOGRAM W/LOWER EXTREMITY;  Surgeon: Cephus Shelling, MD;  [facility]: [facility];  Service: Cardiovascular;  Laterality: N/A;   ABDOMINAL AORTOGRAM W/LOWER EXTREMITY Left 07/11/2021   Procedure: ABDOMINAL AORTOGRAM W/LOWER EXTREMITY;  Surgeon: Cephus Shelling, MD;  [facility];  Service: Cardiovascular;  Laterality: Left;   AMPUTATION Left 05/25/2019   Procedure: AMPUTATION RAY 5th;  Surgeon: Vivi Barrack, DPM;  Location: [facility];  Service: Podiatry;  Laterality: Left;   AMPUTATION Left 01/14/2022   Procedure: LEFT BELOW THE KNEE AMPUTATION;  Surgeon: Terance Hart, MD;  Location: [facility] OR;  Service: Orthopedics;  Laterality: Left;  LENGTH OF SURGERY: 90 MINUTES   APPLICATION OF WOUND VAC Left 01/14/2022   Procedure: WOUND VAC PLACEMENT;  Surgeon: Terance Hart, MD;  Location: [facility] OR;  Service: Orthopedics;  Laterality: Left;   BONE BIOPSY Left 04/24/2021   Procedure: BONE BIOPSY;  Surgeon: Asencion Islam, DPM;  [facility];  Service: Podiatry;  Laterality: Left;  left ankle/fibula   BONE BIOPSY Left 07/15/2021   Procedure: LEFT FOOT BONE BIOPSY;  Surgeon: Vivi Barrack, DPM;  [facility]: [facility];  Service: Podiatry;  Laterality: Left;   Cardiac Event Monitor  July-August 2015   Sinus rhythm with PVCs   CHOLECYSTECTOMY     COLONOSCOPY N/A 08/31/2013   Procedure: COLONOSCOPY;  Surgeon: Charna Elizabeth, MD;  Location: [facility] ENDOSCOPY;  Service: Endoscopy;  Laterality: N/A;   CORONARY  STENT INTERVENTION N/A 11/19/2020   Procedure: CORONARY STENT INTERVENTION;  Surgeon: Yvonne Kendall, MD;  [facility];  Service: Cardiovascular;  Laterality: N/A;   ESOPHAGOGASTRODUODENOSCOPY N/A 09/01/2013   Procedure: ESOPHAGOGASTRODUODENOSCOPY (EGD);  Surgeon: Theda Belfast, MD;  Location: [facility] ENDOSCOPY;  Service: Endoscopy;  Laterality: N/A;  bedside   ESOPHAGOGASTRODUODENOSCOPY (EGD) WITH PROPOFOL N/A 05/26/2021   Procedure: ESOPHAGOGASTRODUODENOSCOPY (EGD) WITH PROPOFOL;  Surgeon: Jeani Hawking, MD;  Location: [facility] ENDOSCOPY;  Service: Gastroenterology;  Laterality: N/A;   EYE SURGERY Bilateral    bilateral cataracts   INCISION AND DRAINAGE Left 04/24/2021   Procedure: INCISION AND DRAINAGE;  Surgeon: Asencion Islam, DPM;  [facility];  Service: Podiatry;  Laterality: Left;   IRRIGATION AND DEBRIDEMENT FOOT Left 07/15/2021   Procedure: IRRIGATION AND DEBRIDEMENT FOOT;  Surgeon: Vivi Barrack, DPM;  [facility];  Service: Podiatry;  Laterality: Left;   LEFT HEART CATH AND CORONARY ANGIOGRAPHY N/A 11/19/2020   Procedure: LEFT HEART CATH AND CORONARY ANGIOGRAPHY;  Surgeon: Yvonne Kendall, MD;  [facility];  Service: Cardiovascular;  Laterality: N/A;   LEFT HEART CATHETERIZATION WITH CORONARY ANGIOGRAM N/A 08/30/2013   Procedure: LEFT HEART CATHETERIZATION WITH CORONARY ANGIOGRAM;  Surgeon: Marykay Lex, MD;  Location: [facility] CATH LAB: 100% mRCA (thrombus - extends to RPAV), 80% D1, 40% AVG Cx.   PERCUTANEOUS CORONARY STENT INTERVENTION (PCI-S)  08/30/2013   Procedure: PERCUTANEOUS CORONARY STENT INTERVENTION (PCI-S);  Surgeon: Marykay Lex, MD;  Location: [facility] CATH LAB;  Integrity Resolute DES 2.0 mm x 38 mm -- 3.35 mm.; PTCA of proximal RPA V. - 3.0 mm x 15 mm balloon   PERIPHERAL VASCULAR ATHERECTOMY  05/26/2019   Procedure: PERIPHERAL VASCULAR ATHERECTOMY;  Surgeon: Cephus Shelling, MD;  [facility];  Service:  Cardiovascular;;  Left SFA   PERIPHERAL VASCULAR BALLOON ANGIOPLASTY Left 07/11/2021   Procedure: PERIPHERAL VASCULAR BALLOON ANGIOPLASTY;  Surgeon: Cephus Shelling, MD;  [facility];  Service: Cardiovascular;  Laterality: Left;  PT TRUNK / AT   PERIPHERAL VASCULAR INTERVENTION  05/26/2019   Procedure: PERIPHERAL VASCULAR INTERVENTION;  Surgeon: Cephus Shelling, MD;  [facility];  Service: Cardiovascular;;  Left SFA   PERIPHERAL VASCULAR INTERVENTION Right 11/15/2020   Procedure: PERIPHERAL VASCULAR INTERVENTION;  Surgeon: Cephus Shelling, MD;  [facility];  Service: Cardiovascular;  Laterality: Right;  Superficial Femoral Artery   PERIPHERAL VASCULAR INTERVENTION Left 07/11/2021   Procedure: PERIPHERAL VASCULAR INTERVENTION;  Surgeon: Cephus Shelling, MD;  [facility];  Service: Cardiovascular;  Laterality: Left;  SFA   PITUITARY SURGERY     TEE WITHOUT CARDIOVERSION N/A 04/26/2021   Procedure: TRANSESOPHAGEAL ECHOCARDIOGRAM (TEE);  Surgeon: Jodelle Red, MD;  Location: [facility] ENDOSCOPY;  Service: Cardiovascular;  Laterality: N/A;   TRANSTHORACIC ECHOCARDIOGRAM  08/30/2013   mild LVH. EF 50-55%. Moderate HK of the entire inferior myocardium. GR 1 DD. Mild LA dilation. Mildly reduced RV function   TYMPANOMASTOIDECTOMY Right 12/28/2017  Procedure: RIGHT TYMPANOMASTOIDECTOMY;  Surgeon: Newman Pies, MD;  Location: [facility];  Service: ENT;  Laterality: Right;    Allergies  Allergies  Allergen Reactions   Strawberry Extract Itching, Swelling and Anaphylaxis    Mouth swells and gets itchy    History of Present Illness    Tammy Good 72 y.o. y/o female with a h/o CAD, PAD, hypothyroidism, hyperlipidemia, PVCs, atrial fibrillation, HTN and left below-knee amputation.  She had inferior STEMI 6/15 with PCI to her RCA and PTCA of the PDA, PTCA of her PIVs/PLA.  Her EF was noted to be  55% and she was noted to have G1 DD.  Her ABIs 3/21 showed left ABI 0.64 with a TBI of 0.35.  She was status post left foot fifth ray partial amputation secondary to osteomyelitis.  She was connected with via video visit by Dr. Herbie Baltimore 02/26/2022.  During that time she was not able to get out due to left leg BKA.  She continued to wear her shrinker on her lower leg prior to measurement for her prosthesis.  She was hoping to have her prosthesis before Christmas.  She felt well after having surgery.  She reported that her energy level had improved.  She was sleeping better.  She reported being able to sleep through the night.  She felt that her overall demeanor had also improved.  She was doing chair exercises and doing well with them.  She was stable from a cardiac standpoint.  Her weight was down.  She was hoping to increase her physical activity as she recovered and received her prosthesis.  She denied exertional dyspnea, PND and orthopnea.  She reported trivial lower extremity edema.  She denied palpitations.  She presents to the clinic today for follow-up evaluation and states she continues to adjust with her left lower extremity prosthesis.  She is now able to walk without assistance at times.  She mainly uses her cane but occasionally uses her walker.  She presents with her walker today.  Initially her blood pressure is 158/60 and on recheck is 134/76.  She denies chest pain and shortness of breath.  She does note some mild right lower extremity swelling when she has her leg down.  She reports compliance with lower extremity support stockings.  She has been following a low-sodium diet.  I will plan to continue her current medication regimen, have her increase her physical activity as tolerated and continue low-sodium diet.  Will plan follow-up in 6 months.  Today she denies chest pain, shortness of breath, lower extremity edema, fatigue, palpitations, melena, hematuria, hemoptysis, diaphoresis, weakness,  presyncope, syncope, orthopnea, and PND.   Home Medications    Prior to Admission medications   Medication Sig Start Date End Date Taking? Authorizing Provider  acetaminophen (TYLENOL) 500 MG tablet Take 500-1,000 mg by mouth 3 (three) times daily as needed for headache or mild pain.    [provider]  apixaban (ELIQUIS) 5 MG TABS tablet Take 1 tablet (5 mg total) by mouth 2 (two) times daily. 09/16/22   Marykay Lex, MD  atorvastatin (LIPITOR) 40 MG tablet Take 1 tablet (40 mg total) by mouth daily at 6 PM. Patient taking differently: Take 40 mg by mouth daily. 09/03/13   Lonia Blood, MD  B-D UF III MINI PEN NEEDLES 31G X 5 MM MISC Inject into the skin. 05/28/20   [provider]  clopidogrel (PLAVIX) 75 MG tablet Take 1 tablet (75 mg total) by  mouth daily. 09/15/22   Marykay Lex, MD  Continuous Blood Gluc Sensor (FREESTYLE LIBRE 2 SENSOR) MISC Apply topically every 14 (fourteen) days. 06/12/20   [provider]  cycloSPORINE (RESTASIS) 0.05 % ophthalmic emulsion 1 drop 2 (two) times daily.    [provider]  dapagliflozin propanediol (FARXIGA) 10 MG TABS tablet Take 10 mg by mouth daily.    [provider]  dexamethasone (DECADRON) 0.5 MG tablet Take 1 tablet (0.5 mg total) by mouth daily. 01/30/22 01/30/23  Love, Evlyn Kanner, PA-C  diclofenac Sodium (VOLTAREN) 1 % GEL Apply 4 g topically 4 (four) times daily. 01/30/22   Love, Evlyn Kanner, PA-C  docusate sodium (COLACE) 100 MG capsule Take 1 capsule (100 mg total) by mouth 2 (two) times daily. 01/30/22   Love, Evlyn Kanner, PA-C  Emollient (LUBRIDERM SERIOUSLY SENSITIVE) LOTN Apply 1 Application topically 2 (two) times daily. 01/30/22   Love, Evlyn Kanner, PA-C  Ensure Max Protein (ENSURE MAX PROTEIN) LIQD Take 330 mLs (11 oz total) by mouth 2 (two) times daily. 01/29/22   Love, Evlyn Kanner, PA-C  ferrous sulfate 325 (65 FE) MG tablet Take 325 mg by mouth at bedtime.    [provider]  furosemide  (LASIX) 20 MG tablet Take 1 tablet (20 mg total) by mouth daily as needed for fluid or edema. 06/03/21 06/03/22  Rai, Ripudeep K, MD  insulin aspart (NOVOLOG FLEXPEN) 100 UNIT/ML FlexPen Inject 0-7 Units into the skin 3 (three) times daily with meals. Sliding scale insulin    [provider]  Insulin Glargine (BASAGLAR KWIKPEN) 100 UNIT/ML Inject 24 Units into the skin at bedtime. Patient taking differently: Inject 18 Units into the skin at bedtime. 01/30/22   Love, Evlyn Kanner, PA-C  levothyroxine (SYNTHROID) 125 MCG tablet Take 1 tablet (125 mcg total) by mouth daily before breakfast. 11/21/20   Baglia, Corrina, PA-C  midodrine (PROAMATINE) 5 MG tablet Take 1 tablet (5 mg total) by mouth 2 (two) times daily with breakfast and lunch. 01/31/22   Love, Evlyn Kanner, PA-C  mirabegron ER (MYRBETRIQ) 50 MG TB24 tablet Take 50 mg by mouth daily.    [provider]  mupirocin ointment (BACTROBAN) 2 % Apply 1 Application topically 2 (two) times daily. 08/29/22   Vivi Barrack, DPM  nitroGLYCERIN (NITROSTAT) 0.4 MG SL tablet Place 0.4 mg under the tongue every 5 (five) minutes as needed for chest pain. 08/23/21   [provider]  pantoprazole (PROTONIX) 40 MG tablet Take 40 mg by mouth daily.    [provider]  polyethylene glycol powder (GLYCOLAX/MIRALAX) 17 GM/SCOOP powder Take 1 capful with water (17 g) by mouth daily. Patient taking differently: Take 17 g by mouth daily as needed (constipation.). 06/04/21   Rai, Delene Ruffini, MD  Propylene Glycol (SYSTANE COMPLETE) 0.6 % SOLN Place 1 drop into both eyes 2 (two) times daily as needed (dry/irritated eyes.).    [provider]  tamsulosin (FLOMAX) 0.4 MG CAPS capsule Take 1 capsule (0.4 mg total) by mouth daily. 02/01/22   Love, Evlyn Kanner, PA-C  trimethoprim (TRIMPEX) 100 MG tablet Take 100 mg by mouth daily.    [provider]    Family History    Family History  Problem Relation Age of Onset   Cancer Mother 32        multiple myeloma   Heart attack Father 14   Cancer Sister    Alzheimer's disease Maternal Grandmother    She indicated that her mother  is deceased. She indicated that her father is deceased. She indicated that two of her three sisters are alive. She indicated that her maternal grandmother is deceased. She indicated that her maternal grandfather is deceased. She indicated that her paternal grandmother is deceased. She indicated that her paternal grandfather is deceased.  Social History    Social History   Socioeconomic History   Marital status: Widowed    Spouse name: Not on file   Number of children: 1   Years of education: Not on file   Highest education level: Not on file  Occupational History   Not on file  Tobacco Use   Smoking status: Former    Passive exposure: Never   Smokeless tobacco: Never  Vaping Use   Vaping Use: Never used  Substance and Sexual Activity   Alcohol use: No   Drug use: No   Sexual activity: Not Currently    Birth control/protection: Post-menopausal  Other Topics Concern   Not on file  Social History Narrative   Widow. Works at [employer].   Former smoker.   Overall not very active.  Hoping to get into water aerobics class.   Social Determinants of Health   Financial Resource Strain: Not on file  Food Insecurity: Not on file  Transportation Needs: Not on file  Physical Activity: Not on file  Stress: Not on file  Social Connections: Not on file  Intimate Partner Violence: Not on file     Review of Systems    General:  No chills, fever, night sweats or weight changes.  Cardiovascular:  No chest pain, dyspnea on exertion, edema, orthopnea, palpitations, paroxysmal nocturnal dyspnea. Dermatological: No rash, lesions/masses Respiratory: No cough, dyspnea Urologic: No hematuria, dysuria Abdominal:   No nausea, vomiting, diarrhea, bright red blood per rectum, melena, or hematemesis Neurologic:  No visual changes, wkns, changes in  mental status. All other systems reviewed and are otherwise negative except as noted above.  Physical Exam    VS:  BP 134/78   Pulse 66   Ht 5\' 7"  (1.702 m)   Wt 214 lb 9.6 oz (97.3 kg)   SpO2 95%   BMI 33.61 kg/m  , BMI Body mass index is 33.61 kg/m. GEN: Well nourished, well developed, in no acute distress. HEENT: normal. Neck: Supple, no JVD, carotid bruits, or masses. Cardiac: RRR, no murmurs, rubs, or gallops. No clubbing, cyanosis, edema.  Radials/DP/PT 2+ and equal bilaterally.  Respiratory:  Respirations regular and unlabored, clear to auscultation bilaterally. GI: Soft, nontender, nondistended, BS + x 4. MS: no deformity or atrophy. Skin: warm and dry, no rash. Neuro:  Strength and sensation are intact. Psych: Normal affect.  Accessory Clinical Findings    Recent Labs: 01/20/2022: ALT 14 01/27/2022: BUN 23; Creatinine, Ser 0.62; Hemoglobin 8.5; Platelets 575; Potassium 3.6; Sodium 138   Recent Lipid Panel    Component Value Date/Time   CHOL 80 07/12/2021 0118   TRIG 106 07/12/2021 0118   HDL 22 (L) 07/12/2021 0118   CHOLHDL 3.6 07/12/2021 0118   VLDL 21 07/12/2021 0118   LDLCALC 37 07/12/2021 0118         ECG personally reviewed by me today-sinus rhythm left axis deviation inferior-posterior infarct, anterolateral infarct 66 bpm- No acute changes   Echocardiogram 04/24/2021   IMPRESSIONS     1. Left ventricular ejection fraction, by estimation, is 40 to 45%. The  left ventricle has mildly decreased function. There is mild concentric  left ventricular hypertrophy.  Left ventricular diastolic parameters are  indeterminate.   2. The mitral valve was not well visualized. No evidence of mitral valve  regurgitation.   3. The aortic valve is tricuspid.   Conclusion(s)/Recommendation(s): Cannot exclude vegetation due to poor  visibility of the valves. Consider transesophageal echocardiogram, if  clinically indicated.   FINDINGS   Left Ventricle: Left  ventricular ejection fraction, by estimation, is 40  to 45%. The left ventricle has mildly decreased function. There is mild  concentric left ventricular hypertrophy. Left ventricular diastolic  parameters are indeterminate.   Right Ventricle: Right vetricular wall thickness was not well visualized.   Mitral Valve: The mitral valve was not well visualized.   Tricuspid Valve: The tricuspid valve is not well visualized. Tricuspid  valve regurgitation is trivial.   Aortic Valve: The aortic valve is tricuspid.   Pulmonic Valve: The pulmonic valve was not well visualized.   Assessment & Plan   1.  Coronary artery disease-denies chest pain today.  Underwent cardiac catheterization with PCI to her RCA and PTCA to her PDA 6/15. Continue current medical therapy Maintain physical activity as tolerated Heart healthy low-sodium diet   Hyperlipidemia-LDL 55 on 8/23. Continue atorvastatin Increase physical activity as tolerated Heart healthy low-sodium high-fiber diet  Atrial fibrillation-EKG today shows sinue rhythm 66 bpm.  Reports compliance with apixaban.  Denies bleeding issues. Continue apixaban Avoid triggers caffeine, chocolate, EtOH, dehydration etc.  PAD-status post left leg BKA 01/14/2022.  Does note occasional right lower extremity swelling.  Tolerating left prosthesis well. Continue atorvastatin High-fiber diet Follows with Dr. Myra Gianotti  Disposition: Follow-up with Dr. Herbie Baltimore or me in 6 months.   Thomasene Ripple. Lam Mccubbins NP-C     09/22/2022, 3:26 PM [facility] [address] Office [phone] Fax [phone]    I spent 14 minutes examining this patient, reviewing medications, and using patient centered shared decision making involving her cardiac care.  Prior to her visit I spent greater than 20 minutes reviewing her past medical history,  medications, and prior cardiac tests.

## 2022-09-22 ENCOUNTER — Encounter: Payer: Self-pay | Admitting: General Practice

## 2022-09-22 ENCOUNTER — Ambulatory Visit: Payer: Medicare Other | Attending: General Practice | Admitting: General Practice

## 2022-09-22 VITALS — BP 134/78 | HR 66 | Ht 67.0 in | Wt 214.6 lb

## 2022-09-22 DIAGNOSIS — Z9861 Coronary angioplasty status: Secondary | ICD-10-CM | POA: Diagnosis present

## 2022-09-22 DIAGNOSIS — Z794 Long term (current) use of insulin: Secondary | ICD-10-CM

## 2022-09-22 DIAGNOSIS — I493 Ventricular premature depolarization: Secondary | ICD-10-CM | POA: Insufficient documentation

## 2022-09-22 DIAGNOSIS — I480 Paroxysmal atrial fibrillation: Secondary | ICD-10-CM | POA: Diagnosis present

## 2022-09-22 DIAGNOSIS — I739 Peripheral vascular disease, unspecified: Secondary | ICD-10-CM | POA: Insufficient documentation

## 2022-09-22 DIAGNOSIS — E785 Hyperlipidemia, unspecified: Secondary | ICD-10-CM | POA: Diagnosis present

## 2022-09-22 DIAGNOSIS — I2510 Atherosclerotic heart disease of native coronary artery without angina pectoris: Secondary | ICD-10-CM | POA: Diagnosis present

## 2022-09-22 DIAGNOSIS — E1169 Type 2 diabetes mellitus with other specified complication: Secondary | ICD-10-CM | POA: Insufficient documentation

## 2022-09-22 NOTE — Patient Instructions (Signed)
Medication Instructions:   Your physician recommends that you continue on your current medications as directed. Please refer to the Current Medication list given to you today.  *If you need a refill on your cardiac medications before your next appointment, please call your pharmacy*   Lab Work: NONE  If you have labs (blood work) drawn today and your tests are completely normal, you will receive your results only by: MyChart Message (if you have MyChart) OR A paper copy in the mail If you have any lab test that is abnormal or we need to change your treatment, we will call you to review the results.   Testing/Procedures: NONE   Follow-Up: At [facility], you and your health needs are our priority.  As part of our continuing mission to provide you with exceptional heart care, we have created designated Provider Care Teams.  These Care Teams include your primary Cardiologist (physician) and Advanced Practice Providers (APPs -  Physician Assistants and Nurse Practitioners) who all work together to provide you with the care you need, when you need it.  We recommend signing up for the patient portal called "MyChart".  Sign up information is provided on this After Visit Summary.  MyChart is used to connect with patients for Virtual Visits (Telemedicine).  Patients are able to view lab/test results, encounter notes, upcoming appointments, etc.  Non-urgent messages can be sent to your provider as well.   To learn more about what you can do with MyChart, go to [email or web address].    Your next appointment:   6 month(s)  Provider:   Bryan Lemma, MD     Other Instructions Maintain physical activity and diet

## 2022-10-09 ENCOUNTER — Ambulatory Visit (INDEPENDENT_AMBULATORY_CARE_PROVIDER_SITE_OTHER): Payer: Medicare Other | Admitting: Podiatry

## 2022-10-09 ENCOUNTER — Ambulatory Visit (INDEPENDENT_AMBULATORY_CARE_PROVIDER_SITE_OTHER): Payer: Medicare Other

## 2022-10-09 DIAGNOSIS — M7751 Other enthesopathy of right foot: Secondary | ICD-10-CM

## 2022-10-09 DIAGNOSIS — R609 Edema, unspecified: Secondary | ICD-10-CM

## 2022-10-09 NOTE — Progress Notes (Unsigned)
Subjective: Chief Complaint  Patient presents with   Foot Swelling    Patient came in today for right foot swelling, and redness, on the top lateral side of the foot,    72 year old female presents the office with above concerns.  She states she stiffness and swelling on the right about 4 to 6 weeks ago starting the lateral aspect and going to the top of her foot.  Denies any recent injuries.  She does not see any open lesions.  Denies not reported any fevers or chills.  Objective: AAO x3, NAD DP/PT pulses palpable bilaterally, CRT less than 3 seconds There is edema present to the right foot and ankle.  There is no significant erythema there is no increase in temperature.  There is no open lesions no areas of fluctuation or crepitation.  The calf is supple. No pain with calf compression, swelling, warmth, erythema  Assessment: Right foot, ankle swelling  Plan: -All treatment options discussed with the patient including all alternatives, risks, complications.  -X-rays were obtained reviewed.  3 views of the foot were obtained.  There is no evidence of acute fracture noted today.  Likely chronic changes about the forefoot metatarsal bases. -Unna boot was applied.  Precautions were advised on when to remove this.  She can use surgical shoe if needed. Elevation.  -Blood work ordered including CBC, sed rate, CRP -Monitor for any clinical signs or symptoms of infection and directed to call the office immediately should any occur or go to the ER. -Patient encouraged to call the office with any questions, concerns, change in symptoms.   Vivi Barrack DPM

## 2022-10-09 NOTE — Patient Instructions (Signed)
[employer] Care- REMOVE IN 5 DAYS OR SOONER IF THERE IS ANY INCREASED PAIN OR SWELLING OR TOE DISCOLORATION  An Unna boot is a type of bandage (dressing) for the foot and leg. The dressing is a wrap made of gauze that is soaked with a medicine called zinc oxide. The gauze may also include other lotions and medicines that help in wound healing, such as calamine. An Unna boot may be used to: Treat open sores (venous ulcers) or graft sites on the foot, heel, or leg. Help with swelling from conditions that affect the veins or lymphatic system (lymphedema). Treat skin conditions such as inflammation caused by poor blood flow (stasis dermatitis). Heal wounds on parts of the body below the hips (lower extremities). The dressing is applied by a health care provider. The gauze is wrapped around your lower extremity in several layers that overlap. These layers usually start at the toes and go up to the knee. A dry outer wrap goes over the medicated wrap for support and pressure (compression). Before applying the [employer], your health care provider will clean your leg and foot and may apply an antibiotic. You may be asked to raise (elevate) your leg for a while to reduce swelling before the boot is put on. The boot will dry and harden after it is applied. It may need to be changed or replaced once or twice a week. Follow these instructions at home: Boot care Wear the [employer] as told by your health care provider. You may need to wear a slipper or shoe over the boot that is one or two sizes larger than normal. Do not stick anything inside the boot to scratch your skin. Doing that increases your risk of infection. Check the skin around the boot every day. Tell your health care provider about any concerns. Keep your [employer] clean and dry. Check the area around the boot every day for signs of infection. Check for: Redness, swelling, or pain in your foot or toes. Fluid or blood coming from the  boot. Warmth. Pus or a bad smell. A rash, itching, or red, swollen areas of skin (hives). Remove the boot and call your health care provider if you have signs of poor blood flow, such as: Your toes tingle or become numb. Your toes turn cold or turn blue or pale. Your toes are more swollen or painful. You cannot move your toes. Bathing Do not take baths, swim, or use a hot tub until your health care provider approves. Ask your health care provider if you may take showers. You may only be allowed to take sponge baths. If your health care provider says that you can take a bath or shower: Do not let the Unna boot get wet. Cover the boot with a watertight covering when you shower. Keep your leg with the boot out of the bathtub when you take a bath. Activity Rest as told by your health care provider. Do not sit for a long time without moving. Get up to take short walks every 1-2 hours. This will improve blood flow and breathing. Ask for help if you feel weak or unsteady. You may walk with the boot once it has dried. Ask your health care provider how much walking is safe for you. General instructions Take over-the-counter and prescription medicines only as told by your health care provider. Keep your leg elevated above the level of your heart while you are sitting or lying down. This will decrease swelling. Do not  sit with your knee bent for long periods of time. Do not use any products that contain nicotine or tobacco. These products include cigarettes, chewing tobacco, and vaping devices, such as e-cigarettes. If you need help quitting, ask your health care provider. Keep all follow-up visits. Your health care provider will change your boot once or twice a week until it is no longer needed. Contact a health care provider if: Your skin feels itchy inside the boot. You feel burning or have a rash or hives in the boot area. You have a fever or chills. You have any signs of infection. You have  more numbness or pain in your foot or toes. The skin on your foot or toes changes colors. This may include the skin turning blue or pale or having patchy areas with spots. Your boot has been damaged or feels like it no longer fits like it should. This information is not intended to replace advice given to you by your health care provider. Make sure you discuss any questions you have with your health care provider. Document Revised: 08/05/2021 Document Reviewed: 08/05/2021 Elsevier Patient Education  2024 [employer].

## 2022-10-10 ENCOUNTER — Other Ambulatory Visit: Payer: Self-pay | Admitting: Podiatry

## 2022-10-10 LAB — CBC WITH DIFFERENTIAL/PLATELET
Basophils Absolute: 0.1 10*3/uL (ref 0.0–0.2)
Basos: 1 %
EOS (ABSOLUTE): 0.1 10*3/uL (ref 0.0–0.4)
Eos: 1 %
Hematocrit: 29.3 % — ABNORMAL LOW (ref 34.0–46.6)
Hemoglobin: 8.9 g/dL — ABNORMAL LOW (ref 11.1–15.9)
Immature Grans (Abs): 0.1 10*3/uL (ref 0.0–0.1)
Immature Granulocytes: 1 %
Lymphocytes Absolute: 0.8 10*3/uL (ref 0.7–3.1)
Lymphs: 7 %
MCH: 27.6 pg (ref 26.6–33.0)
MCHC: 30.4 g/dL — ABNORMAL LOW (ref 31.5–35.7)
MCV: 91 fL (ref 79–97)
Monocytes Absolute: 1 10*3/uL — ABNORMAL HIGH (ref 0.1–0.9)
Monocytes: 8 %
Neutrophils Absolute: 9.8 10*3/uL — ABNORMAL HIGH (ref 1.4–7.0)
Neutrophils: 82 %
Platelets: 565 10*3/uL — ABNORMAL HIGH (ref 150–450)
RBC: 3.23 x10E6/uL — ABNORMAL LOW (ref 3.77–5.28)
RDW: 14.5 % (ref 11.7–15.4)
WBC: 11.8 10*3/uL — ABNORMAL HIGH (ref 3.4–10.8)

## 2022-10-10 LAB — C-REACTIVE PROTEIN: CRP: 12 mg/L — ABNORMAL HIGH (ref 0–10)

## 2022-10-10 LAB — SEDIMENTATION RATE: Sed Rate: 23 mm/hr (ref 0–40)

## 2022-10-10 MED ORDER — DOXYCYCLINE HYCLATE 100 MG PO TABS: 100.0000 mg | ORAL_TABLET | Freq: Two times a day (BID) | ORAL | 0 refills | Status: DC

## 2022-10-15 ENCOUNTER — Telehealth: Payer: Self-pay | Admitting: Cardiology

## 2022-10-15 MED ORDER — CLOPIDOGREL BISULFATE 75 MG PO TABS: 75.0000 mg | ORAL_TABLET | Freq: Every day | ORAL | 3 refills | Status: DC

## 2022-10-15 NOTE — Telephone Encounter (Signed)
Pt's medication was sent to pt's pharmacy as requested. Confirmation received.  °

## 2022-10-15 NOTE — Telephone Encounter (Signed)
*  STAT* If patient is at the pharmacy, call can be transferred to refill team.   1. Which medications need to be refilled? (please list name of each medication and dose if known)   clopidogrel (PLAVIX) 75 MG tablet    2. Which pharmacy/location (including street and city if local pharmacy) is medication to be sent to?[id number] - [place], [place] - [address]   3. Do they need a 30 day or 90 day supply? 90 day

## 2022-10-31 ENCOUNTER — Ambulatory Visit (INDEPENDENT_AMBULATORY_CARE_PROVIDER_SITE_OTHER): Payer: Medicare Other | Admitting: Podiatry

## 2022-10-31 ENCOUNTER — Encounter: Payer: Self-pay | Admitting: Podiatry

## 2022-10-31 DIAGNOSIS — R609 Edema, unspecified: Secondary | ICD-10-CM | POA: Diagnosis not present

## 2022-10-31 DIAGNOSIS — I739 Peripheral vascular disease, unspecified: Secondary | ICD-10-CM | POA: Diagnosis not present

## 2022-10-31 DIAGNOSIS — M7751 Other enthesopathy of right foot: Secondary | ICD-10-CM | POA: Diagnosis not present

## 2022-10-31 DIAGNOSIS — M25474 Effusion, right foot: Secondary | ICD-10-CM | POA: Diagnosis not present

## 2022-11-04 ENCOUNTER — Ambulatory Visit (HOSPITAL_COMMUNITY): Payer: Medicare Other

## 2022-11-04 NOTE — Progress Notes (Signed)
Subjective: Chief Complaint  Patient presents with   Foot Pain    Follow up swelling/pain right foot and ankle   "Its doing much better. I don't really have pain, just some swelling. That wrap helped, but I'm so scared to wear compression socks"    72 year old female presents the office with above concerns.  She said that she is doing better.  Not really having pain but still some swelling.  No open lesions that she reports.  No fevers or chills.  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 There is no significant erythema there is no increase in temperature.  There is no open lesions no areas of fluctuation or crepitation.  The calf is supple. Mild discoloration dorsal aspect of the right forefoot but there is no erythema or warmth or any open lesions. No pain with calf compression, swelling, warmth, erythema  Assessment: Right foot, ankle swelling  Plan: -All treatment options discussed with the patient including all alternatives, risks, complications.  -Unna boot was applied.  Precautions were advised on when to remove this.  She can use surgical shoe if needed.  -Recheck blood work ordered including CBC, sed rate, CRP -Elevation -Will order updated ABI -Monitor for any clinical signs or symptoms of infection and directed to call the office immediately should any occur or go to the ER. -Patient encouraged to call the office with any questions, concerns, change in symptoms.   Vivi Barrack DPM

## 2022-11-05 ENCOUNTER — Other Ambulatory Visit (HOSPITAL_COMMUNITY): Payer: Self-pay | Admitting: Vascular Surgery

## 2022-11-05 ENCOUNTER — Ambulatory Visit (HOSPITAL_COMMUNITY)
Admission: RE | Admit: 2022-11-05 | Discharge: 2022-11-05 | Disposition: A | Discharge status: Home / Self Care | Payer: Medicare Other | Source: Ambulatory Visit | Attending: Podiatry | Admitting: Podiatry

## 2022-11-05 ENCOUNTER — Encounter (HOSPITAL_COMMUNITY): Payer: Medicare Other

## 2022-11-05 ENCOUNTER — Telehealth: Payer: Self-pay | Admitting: Podiatry

## 2022-11-05 ENCOUNTER — Telehealth (HOSPITAL_COMMUNITY): Payer: Self-pay

## 2022-11-05 ENCOUNTER — Telehealth (HOSPITAL_COMMUNITY): Payer: Self-pay | Admitting: *Deleted

## 2022-11-05 DIAGNOSIS — I739 Peripheral vascular disease, unspecified: Secondary | ICD-10-CM | POA: Insufficient documentation

## 2022-11-05 DIAGNOSIS — I70223 Atherosclerosis of native arteries of extremities with rest pain, bilateral legs: Secondary | ICD-10-CM

## 2022-11-05 LAB — VAS US ABI WITH/WO TBI: Right ABI: 0.34

## 2022-11-05 NOTE — Telephone Encounter (Signed)
Patient had ABIs 11/05/2022 based on referral from [facility] due to edema and need of compression wrappings. Results showing critical findings of the R LE. Has history of stent. Will schedule R LE arterial duplex of  stent per recall with PA appt.

## 2022-11-05 NOTE — Telephone Encounter (Signed)
Received critical result form [facility] for the ABI. I called the patient and I have called [facility] to get an urgent appointment. Informed the patient that if symptoms worsen to go to the ER.

## 2022-11-05 NOTE — Telephone Encounter (Signed)
11:10 - Study complete 11:14 - Attempted first call for stat results 11:16 - Secure chat sent 11:21 - Attempted calling again due to no response on secure chat 11:44 - Additional call out made 11:56 - 12 min hold to get someone on the phone 12:02 - Sent to office manager 12:04 - Transferred office manager to reporting tech due to clinical informaiton 12:10 - Call completed, with information that the MD would be informed ASAP they enter the building.

## 2022-11-06 ENCOUNTER — Telehealth: Payer: Self-pay

## 2022-11-06 ENCOUNTER — Observation Stay (HOSPITAL_COMMUNITY)
Admission: EM | Admit: 2022-11-06 | Discharge: 2022-11-07 | Disposition: A | Discharge status: Home / Self Care | Payer: Medicare Other | Attending: Internal Medicine | Admitting: Internal Medicine

## 2022-11-06 ENCOUNTER — Ambulatory Visit (INDEPENDENT_AMBULATORY_CARE_PROVIDER_SITE_OTHER): Payer: Medicare Other | Admitting: Physician Assistant

## 2022-11-06 ENCOUNTER — Ambulatory Visit: Payer: Self-pay

## 2022-11-06 ENCOUNTER — Telehealth: Payer: Self-pay | Admitting: Podiatry

## 2022-11-06 ENCOUNTER — Encounter (HOSPITAL_COMMUNITY): Payer: Self-pay

## 2022-11-06 ENCOUNTER — Ambulatory Visit (INDEPENDENT_AMBULATORY_CARE_PROVIDER_SITE_OTHER)
Admission: RE | Admit: 2022-11-06 | Discharge: 2022-11-06 | Disposition: A | Discharge status: Home / Self Care | Payer: Medicare Other | Source: Ambulatory Visit | Attending: Vascular Surgery | Admitting: Vascular Surgery

## 2022-11-06 ENCOUNTER — Other Ambulatory Visit: Payer: Self-pay

## 2022-11-06 VITALS — BP 146/72 | HR 73 | Temp 98.6°F | Resp 20 | Ht 67.0 in | Wt 211.6 lb

## 2022-11-06 DIAGNOSIS — Z7984 Long term (current) use of oral hypoglycemic drugs: Secondary | ICD-10-CM | POA: Insufficient documentation

## 2022-11-06 DIAGNOSIS — Z89512 Acquired absence of left leg below knee: Secondary | ICD-10-CM | POA: Diagnosis not present

## 2022-11-06 DIAGNOSIS — D508 Other iron deficiency anemias: Secondary | ICD-10-CM | POA: Diagnosis not present

## 2022-11-06 DIAGNOSIS — D509 Iron deficiency anemia, unspecified: Secondary | ICD-10-CM | POA: Diagnosis present

## 2022-11-06 DIAGNOSIS — E119 Type 2 diabetes mellitus without complications: Secondary | ICD-10-CM

## 2022-11-06 DIAGNOSIS — Z7901 Long term (current) use of anticoagulants: Secondary | ICD-10-CM | POA: Insufficient documentation

## 2022-11-06 DIAGNOSIS — E230 Hypopituitarism: Secondary | ICD-10-CM | POA: Diagnosis present

## 2022-11-06 DIAGNOSIS — Z6830 Body mass index (BMI) 30.0-30.9, adult: Secondary | ICD-10-CM | POA: Insufficient documentation

## 2022-11-06 DIAGNOSIS — E1151 Type 2 diabetes mellitus with diabetic peripheral angiopathy without gangrene: Secondary | ICD-10-CM | POA: Diagnosis not present

## 2022-11-06 DIAGNOSIS — T82856A Stenosis of peripheral vascular stent, initial encounter: Secondary | ICD-10-CM

## 2022-11-06 DIAGNOSIS — I70223 Atherosclerosis of native arteries of extremities with rest pain, bilateral legs: Secondary | ICD-10-CM | POA: Insufficient documentation

## 2022-11-06 DIAGNOSIS — Z7902 Long term (current) use of antithrombotics/antiplatelets: Secondary | ICD-10-CM | POA: Diagnosis not present

## 2022-11-06 DIAGNOSIS — I480 Paroxysmal atrial fibrillation: Secondary | ICD-10-CM | POA: Diagnosis not present

## 2022-11-06 DIAGNOSIS — I5022 Chronic systolic (congestive) heart failure: Secondary | ICD-10-CM | POA: Diagnosis not present

## 2022-11-06 DIAGNOSIS — I2510 Atherosclerotic heart disease of native coronary artery without angina pectoris: Secondary | ICD-10-CM | POA: Diagnosis not present

## 2022-11-06 DIAGNOSIS — R42 Dizziness and giddiness: Secondary | ICD-10-CM | POA: Diagnosis present

## 2022-11-06 DIAGNOSIS — Z6832 Body mass index (BMI) 32.0-32.9, adult: Secondary | ICD-10-CM | POA: Diagnosis not present

## 2022-11-06 DIAGNOSIS — I4891 Unspecified atrial fibrillation: Secondary | ICD-10-CM | POA: Diagnosis present

## 2022-11-06 DIAGNOSIS — E669 Obesity, unspecified: Secondary | ICD-10-CM | POA: Diagnosis not present

## 2022-11-06 DIAGNOSIS — I739 Peripheral vascular disease, unspecified: Secondary | ICD-10-CM | POA: Diagnosis present

## 2022-11-06 DIAGNOSIS — I110 Hypertensive heart disease with heart failure: Secondary | ICD-10-CM | POA: Insufficient documentation

## 2022-11-06 DIAGNOSIS — Z87891 Personal history of nicotine dependence: Secondary | ICD-10-CM | POA: Diagnosis not present

## 2022-11-06 DIAGNOSIS — D649 Anemia, unspecified: Secondary | ICD-10-CM | POA: Diagnosis not present

## 2022-11-06 DIAGNOSIS — E1159 Type 2 diabetes mellitus with other circulatory complications: Secondary | ICD-10-CM

## 2022-11-06 DIAGNOSIS — Z794 Long term (current) use of insulin: Secondary | ICD-10-CM

## 2022-11-06 DIAGNOSIS — I10 Essential (primary) hypertension: Secondary | ICD-10-CM | POA: Diagnosis present

## 2022-11-06 LAB — CBC
HCT: 24.4 % — ABNORMAL LOW (ref 36.0–46.0)
Hemoglobin: 6.5 g/dL — CL (ref 12.0–15.0)
MCH: 26.9 pg (ref 26.0–34.0)
MCHC: 26.6 g/dL — ABNORMAL LOW (ref 30.0–36.0)
MCV: 100.8 fL — ABNORMAL HIGH (ref 80.0–100.0)
Platelets: 540 10*3/uL — ABNORMAL HIGH (ref 150–400)
RBC: 2.42 MIL/uL — ABNORMAL LOW (ref 3.87–5.11)
RDW: 17 % — ABNORMAL HIGH (ref 11.5–15.5)
WBC: 10.8 10*3/uL — ABNORMAL HIGH (ref 4.0–10.5)
nRBC: 0 % (ref 0.0–0.2)

## 2022-11-06 LAB — COMPREHENSIVE METABOLIC PANEL
ALT: 14 U/L (ref 0–44)
AST: 13 U/L — ABNORMAL LOW (ref 15–41)
Albumin: 2.9 g/dL — ABNORMAL LOW (ref 3.5–5.0)
Alkaline Phosphatase: 84 U/L (ref 38–126)
Anion gap: 11 (ref 5–15)
BUN: 14 mg/dL (ref 8–23)
CO2: 25 mmol/L (ref 22–32)
Calcium: 8.4 mg/dL — ABNORMAL LOW (ref 8.9–10.3)
Chloride: 105 mmol/L (ref 98–111)
Creatinine, Ser: 0.89 mg/dL (ref 0.44–1.00)
GFR, Estimated: >60 mL/min (ref >60)
Glucose, Bld: 144 mg/dL — ABNORMAL HIGH (ref 70–99)
Potassium: 3.5 mmol/L (ref 3.5–5.1)
Sodium: 141 mmol/L (ref 135–145)
Total Bilirubin: 0.5 mg/dL (ref 0.3–1.2)
Total Protein: 6.2 g/dL — ABNORMAL LOW (ref 6.5–8.1)

## 2022-11-06 LAB — VITAMIN B12: Vitamin B-12: 286 pg/mL (ref 180–914)

## 2022-11-06 LAB — POC OCCULT BLOOD, ED: Fecal Occult Bld: NEGATIVE

## 2022-11-06 LAB — IRON AND TIBC
Iron: 17 ug/dL — ABNORMAL LOW (ref 28–170)
Saturation Ratios: 5 % — ABNORMAL LOW (ref 10.4–31.8)
TIBC: 381 ug/dL (ref 250–450)
UIBC: 364 ug/dL

## 2022-11-06 LAB — PREPARE RBC (CROSSMATCH)

## 2022-11-06 LAB — CBG MONITORING, ED: Glucose-Capillary: 176 mg/dL — ABNORMAL HIGH (ref 70–99)

## 2022-11-06 LAB — FOLATE: Folate: 11.2 ng/mL (ref >5.9)

## 2022-11-06 MED ORDER — BASAGLAR KWIKPEN 100 UNIT/ML ~~LOC~~ SOPN
18.0000 [IU] | PEN_INJECTOR | Freq: Every day | SUBCUTANEOUS | Status: DC
  Filled 2022-11-06: qty 3

## 2022-11-06 MED ORDER — ATORVASTATIN CALCIUM 40 MG PO TABS
40.0000 mg | ORAL_TABLET | Freq: Every day | ORAL | Status: DC
  Administered 2022-11-07: 40 mg via ORAL
  Filled 2022-11-06: qty 1

## 2022-11-06 MED ORDER — FERROUS SULFATE 325 (65 FE) MG PO TABS: 325.0000 mg | ORAL_TABLET | Freq: Every day | ORAL | Status: DC

## 2022-11-06 MED ORDER — FUROSEMIDE 10 MG/ML IJ SOLN
40.0000 mg | INTRAMUSCULAR | Status: DC | PRN
  Administered 2022-11-07: 40 mg via INTRAVENOUS
  Filled 2022-11-06: qty 4

## 2022-11-06 MED ORDER — SODIUM CHLORIDE 0.9% IV SOLUTION: Freq: Once | INTRAVENOUS | Status: AC

## 2022-11-06 MED ORDER — INSULIN GLARGINE-YFGN 100 UNIT/ML ~~LOC~~ SOLN
18.0000 [IU] | Freq: Every day | SUBCUTANEOUS | Status: DC
  Administered 2022-11-07: 18 [IU] via SUBCUTANEOUS
  Filled 2022-11-06 (×3): qty 0.18

## 2022-11-06 MED ORDER — APIXABAN 5 MG PO TABS
5.0000 mg | ORAL_TABLET | Freq: Two times a day (BID) | ORAL | Status: DC
  Administered 2022-11-06 – 2022-11-07 (×2): 5 mg via ORAL
  Filled 2022-11-06 (×2): qty 1

## 2022-11-06 MED ORDER — SODIUM CHLORIDE 0.9% IV SOLUTION: Freq: Once | INTRAVENOUS | Status: DC

## 2022-11-06 MED ORDER — POTASSIUM CHLORIDE CRYS ER 20 MEQ PO TBCR
40.0000 meq | EXTENDED_RELEASE_TABLET | ORAL | Status: AC
  Administered 2022-11-06 – 2022-11-07 (×2): 40 meq via ORAL
  Filled 2022-11-06 (×2): qty 2

## 2022-11-06 NOTE — Assessment & Plan Note (Signed)
Patient with known iron deficiency anemia.  Continue with oral iron.

## 2022-11-06 NOTE — Assessment & Plan Note (Signed)
Chronic.  BMI of 33.

## 2022-11-06 NOTE — Assessment & Plan Note (Signed)
Stable.  Will give IV Lasix in between units of blood.

## 2022-11-06 NOTE — Assessment & Plan Note (Signed)
Continue with Decadron.  Chronic.

## 2022-11-06 NOTE — Telephone Encounter (Signed)
Received critical result regarding blood work. Called patient to get her PCP. Called Dr. Rinaldo Cloud office and they directed her to go to the ER. We have called her x 2, no answer. Will attempt again.

## 2022-11-06 NOTE — ED Provider Notes (Signed)
[place] EMERGENCY DEPARTMENT AT [facility] Provider Note  CSN: [id number] Arrival date & time: 11/06/22 1656  Chief Complaint(s) Abnormal Lab  HPI Tammy Good is a 72 y.o. female history of CAD, CHF, hypertension, hyperlipidemia, diabetes, peripheral artery disease presenting to the emergency department with abnormal lab.  Patient reports that she had labs checked with her podiatrist, found that her hemoglobin was 7 and advised to come to the ER.  Patient reports that she feels some increased fatigue and generalized weakness over the past few days.  Has not noticed any black or bloody stools, hematemesis, hemoptysis, easy bleeding or bruising, vaginal bleeding or other bleeding.  Has been taking Eliquis.  No chest pain, shortness of breath, abdominal pain, fevers, chills.   Past Medical History Past Medical History:  Diagnosis Date   CAD S/P percutaneous coronary angioplasty 08/2013   100% mRCA - PCI Integrity Resolute DES 3.0 mm x 38 mm - 3.35 mm; PTCA of RPA V 2.0 mm x 15 mm   CHF (congestive heart failure) (HCC)    Cholesteatoma    right   Diabetes mellitus type 2 in obese    On insulin and Invokana   History of osteomyelitis L 5th Toe all 05/2019   s/p Partial Ray Amputation with partial closure; 6 wks Abx & LSFA Atherectomy/DEB PTA with Stent for focal dissection.   Hyperlipidemia with target LDL less than 70    Hypothyroidism (acquired)    Mild essential hypertension    Obesity (BMI 30-39.9) 11/17/2013   PAD (peripheral artery disease) (HCC) 05/26/2019   05/26/19: Abd AoGram- BLE runoff -> L SFA orbital atherectomy - PTA w/ DEB & Stent 6 x 40 Luttonix (for focal dissection) - patent Pop A with 3 V runoff. LEA Dopplers 01/03/2020: RABI (prev) 0.68 (0.53)/ RTBI (prev) 0.58 (0.33); LABI (prev) 0.80 (0.64), LTBI (prev) 0.64 (0.51); R mSFA ~50-74%, L mSFA 50-74%. Patent Prox SFA stent < 49% stenosis   Panhypopituitarism (HCC)    ST elevation myocardial infarction  (STEMI) of inferior wall, subsequent episode of care (HCC) 08/2013   80% branch of D1, 40% mid AV groove circumflex, 100% RCA with subacute thrombus -- thrombus extending into RPA V with 100% occlusion after initial angioplasty of mid RCA ;; Post MI ECHO 6/9/'15: EF 50-55%, mild LVH with moderate HK of inferior wall, Gr1 DD, mild LA dilation; mildly reduced RV function   Patient Active Problem List   Diagnosis Date Noted   Nocturia more than twice per night 02/03/2022   Unilateral complete BKA, left, initial encounter (HCC) 01/17/2022   Normocytic anemia 01/13/2022   Atrial fibrillation (HCC) 08/15/2021   Medication monitoring encounter 08/02/2021   Chronic systolic CHF (congestive heart failure) (HCC) 06/02/2021   Hypothyroidism 06/02/2021   Ulcer of esophagus without bleeding    Pressure injury of skin 05/31/2021   Symptomatic anemia 05/29/2021   GI bleed 05/29/2021   Liver lesion 05/29/2021   Vertigo 05/24/2021   Generalized weakness 05/24/2021   Thrombocytosis 04/23/2021   Fatty liver 06/19/2020   Atherosclerotic heart disease of native coronary artery without angina pectoris 03/20/2020   Epigastric pain 03/20/2020   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 PLA 09/16/2013   Hyperlipidemia associated with type 2 diabetes mellitus (HCC) 09/16/2013   Presence of coronary angioplasty implant and graft 09/08/2013   Panhypopituitarism (HCC) 08/30/2013   Non-ST elevation (NSTEMI) myocardial  infarction [facility]) 08/22/2013   CAD S/P percutaneous coronary angioplasty 08/22/2013   Home Medication(s) Prior to Admission medications   Medication Sig Start Date End Date Taking? Authorizing Provider  apixaban (ELIQUIS) 5 MG TABS tablet Take 1 tablet (5 mg total) by mouth 2 (two) times daily. 09/16/22  Yes Marykay Lex, MD  atorvastatin (LIPITOR) 40 MG tablet Take 1 tablet (40 mg total) by mouth daily at 6 PM. Patient taking differently: Take 40 mg by mouth daily. 09/03/13  Yes Lonia Blood, MD  clopidogrel (PLAVIX) 75 MG tablet Take 1 tablet (75 mg total) by mouth daily. 10/15/22  Yes Marykay Lex, MD  cycloSPORINE (RESTASIS) 0.05 % ophthalmic emulsion 1 drop 2 (two) times daily.   Yes [provider]  dapagliflozin propanediol (FARXIGA) 10 MG TABS tablet Take 10 mg by mouth daily.   Yes [provider]  dexamethasone (DECADRON) 0.5 MG tablet Take 1 tablet (0.5 mg total) by mouth daily. 01/30/22 01/30/23 Yes Love, Evlyn Kanner, PA-C  diclofenac Sodium (VOLTAREN) 1 % GEL Apply 4 g topically 4 (four) times daily. 01/30/22  Yes Love, Evlyn Kanner, PA-C  ferrous sulfate 325 (65 FE) MG tablet Take 325 mg by mouth at bedtime.   Yes [provider]  insulin aspart (NOVOLOG FLEXPEN) 100 UNIT/ML FlexPen Inject 0-7 Units into the skin 3 (three) times daily with meals. Sliding scale insulin   Yes [provider]  Insulin Glargine (BASAGLAR KWIKPEN) 100 UNIT/ML Inject 24 Units into the skin at bedtime. Patient taking differently: Inject 18 Units into the skin at bedtime. 01/30/22  Yes Love, Evlyn Kanner, PA-C  levothyroxine (SYNTHROID) 125 MCG tablet Take 1 tablet (125 mcg total) by mouth daily before breakfast. 11/21/20  Yes Baglia, Corrina, PA-C  midodrine (PROAMATINE) 5 MG tablet Take 1 tablet (5 mg total) by mouth 2 (two) times daily with breakfast and lunch. 01/31/22  Yes Love, Pamela S, PA-C  mirabegron ER (MYRBETRIQ) 50 MG TB24 tablet Take 50 mg by mouth daily.   Yes  [provider]  mupirocin ointment (BACTROBAN) 2 % Apply 1 Application topically 2 (two) times daily. 08/29/22  Yes Vivi Barrack, DPM  nitroGLYCERIN (NITROSTAT) 0.4 MG SL tablet Place 0.4 mg under the tongue every 5 (five) minutes as needed for chest pain. 08/23/21  Yes [provider]  pantoprazole (PROTONIX) 40 MG tablet Take 40 mg by mouth daily.   Yes [provider]  polyethylene glycol powder (GLYCOLAX/MIRALAX) 17 GM/SCOOP powder Take 1 capful with water (17 g) by mouth daily. Patient taking differently: Take 17 g by mouth daily as needed (constipation.). 06/04/21  Yes Rai, Ripudeep K, MD  Propylene Glycol (SYSTANE COMPLETE) 0.6 % SOLN Place 1 drop into both eyes 2 (two) times daily as needed (dry/irritated eyes.).   Yes [provider]  B-D UF III MINI PEN NEEDLES 31G X 5 MM MISC Inject into the skin. 05/28/20   [provider]  Continuous Blood Gluc Sensor (FREESTYLE LIBRE 2 SENSOR) MISC Apply topically every 14 (fourteen) days. 06/12/20   [provider]  furosemide (LASIX) 20 MG tablet Take 1 tablet (  20 mg total) by mouth daily as needed for fluid or edema. Patient not taking: Reported on 09/22/2022 06/03/21 06/03/22  Rai, Delene Ruffini, MD  trimethoprim (TRIMPEX) 100 MG tablet Take 100 mg by mouth daily. Patient not taking: Reported on 11/06/2022    [provider]                                                                                                                                    Past Surgical History Past Surgical History:  Procedure Laterality Date   ABDOMINAL AORTOGRAM W/LOWER EXTREMITY N/A 05/26/2019   Procedure: ABDOMINAL AORTOGRAM W/LOWER EXTREMITY;  Surgeon: Cephus Shelling, MD;  Location: [facility] INVASIVE CV LAB;  Service: Cardiovascular;  Laterality: N/A;   ABDOMINAL AORTOGRAM W/LOWER EXTREMITY N/A 11/15/2020   Procedure: ABDOMINAL AORTOGRAM W/LOWER EXTREMITY;  Surgeon: Cephus Shelling, MD;  Location: [facility]  INVASIVE CV LAB;  Service: Cardiovascular;  Laterality: N/A;   ABDOMINAL AORTOGRAM W/LOWER EXTREMITY N/A 04/18/2021   Procedure: ABDOMINAL AORTOGRAM W/LOWER EXTREMITY;  Surgeon: Cephus Shelling, MD;  Location: [facility] INVASIVE CV LAB;  Service: Cardiovascular;  Laterality: N/A;   ABDOMINAL AORTOGRAM W/LOWER EXTREMITY Left 07/11/2021   Procedure: ABDOMINAL AORTOGRAM W/LOWER EXTREMITY;  Surgeon: Cephus Shelling, MD;  Location: [facility] INVASIVE CV LAB;  Service: Cardiovascular;  Laterality: Left;   AMPUTATION Left 05/25/2019   Procedure: AMPUTATION RAY 5th;  Surgeon: Vivi Barrack, DPM;  Location: [facility] OR;  Service: Podiatry;  Laterality: Left;   AMPUTATION Left 01/14/2022   Procedure: LEFT BELOW THE KNEE AMPUTATION;  Surgeon: Terance Hart, MD;  Location: [facility] OR;  Service: Orthopedics;  Laterality: Left;  LENGTH OF SURGERY: 90 MINUTES   APPLICATION OF WOUND VAC Left 01/14/2022   Procedure: WOUND VAC PLACEMENT;  Surgeon: Terance Hart, MD;  Location: [facility] OR;  Service: Orthopedics;  Laterality: Left;   BONE BIOPSY Left 04/24/2021   Procedure: BONE BIOPSY;  Surgeon: Asencion Islam, DPM;  Location: [facility] OR;  Service: Podiatry;  Laterality: Left;  left ankle/fibula   BONE BIOPSY Left 07/15/2021   Procedure: LEFT FOOT BONE BIOPSY;  Surgeon: Vivi Barrack, DPM;  Location: [facility] OR;  Service: Podiatry;  Laterality: Left;   Cardiac Event Monitor  July-August 2015   Sinus rhythm with PVCs   CHOLECYSTECTOMY     COLONOSCOPY N/A 08/31/2013   Procedure: COLONOSCOPY;  Surgeon: Charna Elizabeth, MD;  Location: [facility] ENDOSCOPY;  Service: Endoscopy;  Laterality: N/A;   CORONARY STENT INTERVENTION N/A 11/19/2020   Procedure: CORONARY STENT INTERVENTION;  Surgeon: Yvonne Kendall, MD;  Location: [facility] INVASIVE CV LAB;  Service: Cardiovascular;  Laterality: N/A;   ESOPHAGOGASTRODUODENOSCOPY N/A 09/01/2013   Procedure: ESOPHAGOGASTRODUODENOSCOPY (EGD);  Surgeon: Theda Belfast, MD;  Location: [facility] ENDOSCOPY;   Service: Endoscopy;  Laterality: N/A;  bedside   ESOPHAGOGASTRODUODENOSCOPY (EGD) WITH PROPOFOL N/A 05/26/2021   Procedure: ESOPHAGOGASTRODUODENOSCOPY (EGD) WITH PROPOFOL;  Surgeon: Jeani Hawking, MD;  Location: [facility] ENDOSCOPY;  Service: Gastroenterology;  Laterality: N/A;   EYE SURGERY Bilateral    bilateral cataracts   INCISION AND DRAINAGE Left 04/24/2021   Procedure: INCISION AND DRAINAGE;  Surgeon: Asencion Islam, DPM;  Location: [facility] OR;  Service: Podiatry;  Laterality: Left;   IRRIGATION AND DEBRIDEMENT FOOT Left 07/15/2021   Procedure: IRRIGATION AND DEBRIDEMENT FOOT;  Surgeon: Vivi Barrack, DPM;  Location: [facility] OR;  Service: Podiatry;  Laterality: Left;   LEFT HEART CATH AND CORONARY ANGIOGRAPHY N/A 11/19/2020   Procedure: LEFT HEART CATH AND CORONARY ANGIOGRAPHY;  Surgeon: Yvonne Kendall, MD;  [facility]: [facility] INVASIVE CV LAB;  Service: Cardiovascular;  Laterality: N/A;   LEFT HEART CATHETERIZATION WITH CORONARY ANGIOGRAM N/A 08/30/2013   Procedure: LEFT HEART CATHETERIZATION WITH CORONARY ANGIOGRAM;  Surgeon: Marykay Lex, MD;  Location: [facility] CATH LAB: 100% mRCA (thrombus - extends to RPAV), 80% D1, 40% AVG Cx.   PERCUTANEOUS CORONARY STENT INTERVENTION (PCI-S)  08/30/2013   Procedure: PERCUTANEOUS CORONARY STENT INTERVENTION (PCI-S);  Surgeon: Marykay Lex, MD;  Location: [facility] CATH LAB;  Integrity Resolute DES 2.0 mm x 38 mm -- 3.35 mm.; PTCA of proximal RPA V. - 3.0 mm x 15 mm balloon   PERIPHERAL VASCULAR ATHERECTOMY  05/26/2019   Procedure: PERIPHERAL VASCULAR ATHERECTOMY;  Surgeon: Cephus Shelling, MD;  [facility]: [facility] INVASIVE CV LAB;  Service: Cardiovascular;;  Left SFA   PERIPHERAL VASCULAR BALLOON ANGIOPLASTY Left 07/11/2021   Procedure: PERIPHERAL VASCULAR BALLOON ANGIOPLASTY;  Surgeon: Cephus Shelling, MD;  [facility]: [facility] INVASIVE CV LAB;  Service: Cardiovascular;  Laterality: Left;  PT TRUNK / AT   PERIPHERAL VASCULAR INTERVENTION  05/26/2019   Procedure: PERIPHERAL  VASCULAR INTERVENTION;  Surgeon: Cephus Shelling, MD;  [facility]: [facility] INVASIVE CV LAB;  Service: Cardiovascular;;  Left SFA   PERIPHERAL VASCULAR INTERVENTION Right 11/15/2020   Procedure: PERIPHERAL VASCULAR INTERVENTION;  Surgeon: Cephus Shelling, MD;  [facility] INVASIVE CV LAB;  Service: Cardiovascular;  Laterality: Right;  Superficial Femoral Artery   PERIPHERAL VASCULAR INTERVENTION Left 07/11/2021   Procedure: PERIPHERAL VASCULAR INTERVENTION;  Surgeon: Cephus Shelling, MD;  [facility]: [facility] INVASIVE CV LAB;  Service: Cardiovascular;  Laterality: Left;  SFA   PITUITARY SURGERY     TEE WITHOUT CARDIOVERSION N/A 04/26/2021   Procedure: TRANSESOPHAGEAL ECHOCARDIOGRAM (TEE);  Surgeon: Jodelle Red, MD;  Location: [facility] ENDOSCOPY;  Service: Cardiovascular;  Laterality: N/A;   TRANSTHORACIC ECHOCARDIOGRAM  08/30/2013   mild LVH. EF 50-55%. Moderate HK of the entire inferior myocardium. GR 1 DD. Mild LA dilation. Mildly reduced RV function   TYMPANOMASTOIDECTOMY Right 12/28/2017   Procedure: RIGHT TYMPANOMASTOIDECTOMY;  Surgeon: Newman Pies, MD;  Location: [facility];  Service: ENT;  Laterality: Right;   Family History Family History  Problem Relation Age of Onset   Cancer Mother 80       multiple myeloma   Heart attack Father 75   Cancer Sister    Alzheimer's disease Maternal Grandmother     Social History Social History   Tobacco Use   Smoking status: Former    Passive exposure: Never   Smokeless tobacco: Never  Vaping Use   Vaping status: Never Used  Substance Use Topics   Alcohol use: No   Drug use: No   Allergies Strawberry extract  Review of Systems Review of Systems  All other systems reviewed and are negative.   Physical Exam Vital Signs  I have reviewed the triage vital signs BP (!) 149/70   Pulse 81   Temp  99.2 F (37.3 C) (Oral)   Resp 16   Ht 5\' 7"  (1.702 m)   Wt 95.7 kg   SpO2 100%   BMI 33.05 kg/m  Physical  Exam Vitals and nursing note reviewed.  Constitutional:      General: She is not in acute distress.    Appearance: She is well-developed.  HENT:     Head: Normocephalic and atraumatic.     Mouth/Throat:     Mouth: Mucous membranes are moist.  Eyes:     Pupils: Pupils are equal, round, and reactive to light.  Cardiovascular:     Rate and Rhythm: Normal rate and regular rhythm.     Heart sounds: No murmur heard. Pulmonary:     Effort: Pulmonary effort is normal. No respiratory distress.     Breath sounds: Normal breath sounds.  Abdominal:     General: Abdomen is flat.     Palpations: Abdomen is soft.     Tenderness: There is no abdominal tenderness.  Genitourinary:    Comments: Dark stool but not melanotic, chaperoned by RN Arnett Musculoskeletal:        General: No tenderness.     Right lower leg: No edema.     Left lower leg: No edema.     Comments: Left BKA  Skin:    General: Skin is warm and dry.  Neurological:     General: No focal deficit present.     Mental Status: She is alert. Mental status is at baseline.  Psychiatric:        Mood and Affect: Mood normal.        Behavior: Behavior normal.     ED Results and Treatments Labs (all labs ordered are listed, but only abnormal results are displayed) Labs Reviewed  COMPREHENSIVE METABOLIC PANEL - Abnormal; Notable for the following components:      Result Value   Glucose, Bld 144 (*)    Calcium 8.4 (*)    Total Protein 6.2 (*)    Albumin 2.9 (*)    AST 13 (*)    All other components within normal limits  CBC - Abnormal; Notable for the following components:   WBC 10.8 (*)    RBC 2.42 (*)    Hemoglobin 6.5 (*)    HCT 24.4 (*)    MCV 100.8 (*)    MCHC 26.6 (*)    RDW 17.0 (*)    Platelets 540 (*)    All other components within normal limits  IRON AND TIBC - Abnormal; Notable for the following components:   Iron 17 (*)    Saturation Ratios 5 (*)    All other components within normal limits  FOLATE   VITAMIN B12  POC OCCULT BLOOD, ED  TYPE AND SCREEN  PREPARE RBC (CROSSMATCH)  PREPARE RBC (CROSSMATCH)  Radiology VAS [place] LOWER EXTREMITY ARTERIAL DUPLEX  Result Date: 11/06/2022 LOWER EXTREMITY ARTERIAL DUPLEX STUDY Patient Name:  Tammy Good  Date of Exam:   11/06/2022 Medical Rec #: [id number]          Accession #:    [id number] Date of Birth: 1950-10-11           Patient Gender: F Patient Age:   41 years Exam Location:  [facility] Procedure:      VAS [place] LOWER EXTREMITY ARTERIAL DUPLEX Referring Phys: Cristal Deer DICKSON --------------------------------------------------------------------------------  Indications: Peripheral artery disease. High Risk Factors: Hypertension, hyperlipidemia.  Vascular Interventions: 07/2019: Left SFA stent                         10/2020: Right SFA stent                         07/11/21: Left SFA angioplasty and stent; ATA ,TPT ,PTA                         angioplasty 5th ray amp 2021.                         01/14/2022: Left BKA. Current ABI:            R 0.34 L BKA Performing Technologist: Argentina Ponder RVS  Examination Guidelines: A complete evaluation includes B-mode imaging, spectral Doppler, color Doppler, and power Doppler as needed of all accessible portions of each vessel. Bilateral testing is considered an integral part of a complete examination. Limited examinations for reoccurring indications may be performed as noted.  +-----------+--------+-----+---------------+----------+--------+ RIGHT      PSV cm/sRatioStenosis       Waveform  Comments +-----------+--------+-----+---------------+----------+--------+ CFA Prox   233          50-74% stenosismonophasic         +-----------+--------+-----+---------------+----------+--------+ DFA        204          50-74% stenosismonophasic          +-----------+--------+-----+---------------+----------+--------+ SFA Prox   217          50-74% stenosismonophasic         +-----------+--------+-----+---------------+----------+--------+ SFA Mid    64                          monophasic         +-----------+--------+-----+---------------+----------+--------+ POP Distal 257          50-74% stenosismonophasic         +-----------+--------+-----+---------------+----------+--------+ ATA Distal 47                          monophasic         +-----------+--------+-----+---------------+----------+--------+ PTA Distal 54                          monophasic         +-----------+--------+-----+---------------+----------+--------+ PERO Distal39                          monophasic         +-----------+--------+-----+---------------+----------+--------+  Right Stent(s): +---------------+--------+---------------+----------+--------+ SFA-POP        PSV cm/sStenosis       Waveform  Comments +---------------+--------+---------------+----------+--------+ Prox to  Stent  51                     monophasic         +---------------+--------+---------------+----------+--------+ Proximal Stent 45                     monophasic         +---------------+--------+---------------+----------+--------+ Mid Stent      45                     monophasic         +---------------+--------+---------------+----------+--------+ Distal Stent   58                     monophasic         +---------------+--------+---------------+----------+--------+ Distal to [id number]     50-99% stenosismonophasic         +---------------+--------+---------------+----------+--------+    Summary: Right: 50-74% stenosis noted in the common femoral artery. 50-74% stenosis noted in the deep femoral artery. 50-74% stenosis noted in the superficial femoral artery. 50-74% stenosis noted in the popliteal artery. Patent mid superficial femoral artery to  popliteal artery stent with velocities in the native outflow (popliteal) suggesting a 50-99% stenosis.   See table(s) above for measurements and observations. Electronically signed by Lemar Livings MD on 11/06/2022 at 12:00:15 PM.    Final     Pertinent labs & imaging results that were available during my care of the patient were reviewed by me and considered in my medical decision making (see MDM for details).  Medications Ordered in ED Medications  apixaban (ELIQUIS) tablet 5 mg (has no administration in time range)  atorvastatin (LIPITOR) tablet 40 mg (has no administration in time range)  ferrous sulfate tablet 325 mg (has no administration in time range)  furosemide (LASIX) injection 40 mg (has no administration in time range)  0.9 %  sodium chloride infusion (Manually program via Guardrails IV Fluids) (has no administration in time range)  potassium chloride SA (KLOR-CON M) CR tablet 40 mEq (has no administration in time range)  insulin glargine-yfgn (SEMGLEE) injection 18 Units (has no administration in time range)  0.9 %  sodium chloride infusion (Manually program via Guardrails IV Fluids) ( Intravenous New Bag/Given 11/06/22 2108)                                                                                                                                     Procedures .Critical Care  Performed by: Lonell Grandchild, MD Authorized by: Lonell Grandchild, MD   Critical care provider statement:    Critical care time (minutes):  30   Critical care was necessary to treat or prevent imminent or life-threatening deterioration of the following conditions:  Circulatory failure   Critical care was time spent personally by me on the following activities:  Development of treatment plan with patient or surrogate, discussions  with consultants, evaluation of patient's response to treatment, examination of patient, ordering and review of laboratory studies, ordering and review of radiographic  studies, ordering and performing treatments and interventions, pulse oximetry, re-evaluation of patient's condition and review of old charts   Care discussed with: admitting provider     (including critical care time)  Medical Decision Making / ED Course   MDM:  72 year old female presenting to the emergency department with abnormal lab.  Patient overall well-appearing, physical exam nonfocal.  Does not appear volume overloaded.  Does have dark stool on rectal exam but not really melanotic.  Will check occult stool, will order transfusion.  Patient is on Eliquis and has significant medical problems so probably would benefit from overnight observation.  Denies any obvious large bleeding source.  Does have some chronic anemia.  But has not recently been less than 7.  Clinical Course as of 11/06/22 2326  Thu Nov 06, 2022  2244 POCT stool testing is negative. Discussed with Dr. Imogene Burn who will evaluate patient.  [WS]    Clinical Course User Index [WS] Lonell Grandchild, MD     Additional history obtained:  -External records from outside source obtained and reviewed including: Chart review including previous notes, labs, imaging, consultation notes including podiatry notes   Lab Tests: -I ordered, reviewed, and interpreted labs.   The pertinent results include:   Labs Reviewed  COMPREHENSIVE METABOLIC PANEL - Abnormal; Notable for the following components:      Result Value   Glucose, Bld 144 (*)    Calcium 8.4 (*)    Total Protein 6.2 (*)    Albumin 2.9 (*)    AST 13 (*)    All other components within normal limits  CBC - Abnormal; Notable for the following components:   WBC 10.8 (*)    RBC 2.42 (*)    Hemoglobin 6.5 (*)    HCT 24.4 (*)    MCV 100.8 (*)    MCHC 26.6 (*)    RDW 17.0 (*)    Platelets 540 (*)    All other components within normal limits  IRON AND TIBC - Abnormal; Notable for the following components:   Iron 17 (*)    Saturation Ratios 5 (*)    All  other components within normal limits  FOLATE  VITAMIN B12  POC OCCULT BLOOD, ED  TYPE AND SCREEN  PREPARE RBC (CROSSMATCH)  PREPARE RBC (CROSSMATCH)    Notable for acute anemia   Medicines ordered and prescription drug management: Meds ordered this encounter  Medications   0.9 %  sodium chloride infusion (Manually program via Guardrails IV Fluids)   apixaban (ELIQUIS) tablet 5 mg   atorvastatin (LIPITOR) tablet 40 mg   ferrous sulfate tablet 325 mg   DISCONTD: Basaglar KwikPen KwikPen 18 Units   furosemide (LASIX) injection 40 mg   0.9 %  sodium chloride infusion (Manually program via Guardrails IV Fluids)   potassium chloride SA (KLOR-CON M) CR tablet 40 mEq   insulin glargine-yfgn (SEMGLEE) injection 18 Units    -I have reviewed the patients home medicines and have made adjustments as needed   Consultations Obtained: I requested consultation with the hospitalist,  and discussed lab and imaging findings as well as pertinent plan - they recommend: admission   Cardiac Monitoring: The patient was maintained on a cardiac monitor.  I personally viewed and interpreted the cardiac monitored which showed an underlying rhythm of: NSR  Social Determinants of Health:  Diagnosis or treatment  significantly limited by social determinants of health: obesity   Reevaluation: After the interventions noted above, I reevaluated the patient and found that their symptoms have improved  Co morbidities that complicate the patient evaluation  Past Medical History:  Diagnosis Date   CAD S/P percutaneous coronary angioplasty 08/2013   100% mRCA - PCI Integrity Resolute DES 3.0 mm x 38 mm - 3.35 mm; PTCA of RPA V 2.0 mm x 15 mm   CHF (congestive heart failure) (HCC)    Cholesteatoma    right   Diabetes mellitus type 2 in obese    On insulin and Invokana   History of osteomyelitis L 5th Toe all 05/2019   s/p Partial Ray Amputation with partial closure; 6 wks Abx & LSFA Atherectomy/DEB PTA  with Stent for focal dissection.   Hyperlipidemia with target LDL less than 70    Hypothyroidism (acquired)    Mild essential hypertension    Obesity (BMI 30-39.9) 11/17/2013   PAD (peripheral artery disease) (HCC) 05/26/2019   05/26/19: Abd AoGram- BLE runoff -> L SFA orbital atherectomy - PTA w/ DEB & Stent 6 x 40 Luttonix (for focal dissection) - patent Pop A with 3 V runoff. LEA Dopplers 01/03/2020: RABI (prev) 0.68 (0.53)/ RTBI (prev) 0.58 (0.33); LABI (prev) 0.80 (0.64), LTBI (prev) 0.64 (0.51); R mSFA ~50-74%, L mSFA 50-74%. Patent Prox SFA stent < 49% stenosis   Panhypopituitarism (HCC)    ST elevation myocardial infarction (STEMI) of inferior wall, subsequent episode of care (HCC) 08/2013   80% branch of D1, 40% mid AV groove circumflex, 100% RCA with subacute thrombus -- thrombus extending into RPA V with 100% occlusion after initial angioplasty of mid RCA ;; Post MI ECHO 6/9/'15: EF 50-55%, mild LVH with moderate HK of inferior wall, Gr1 DD, mild LA dilation; mildly reduced RV function      Dispostion: Disposition decision including need for hospitalization was considered, and patient admitted to the hospital.    Final Clinical Impression(s) / ED Diagnoses Final diagnoses:  Symptomatic anemia     This chart was dictated using voice recognition software.  Despite best efforts to proofread,  errors can occur which can change the documentation meaning.    Lonell Grandchild, MD 11/06/22 [id number]

## 2022-11-06 NOTE — Progress Notes (Signed)
Office Note   History of Present Illness   Tammy Good is a 72 y.o. ([birth year]) female who presents as a triage visit.  She has a history of right SFA stenting in August 2022 for a right foot wound which is now healed.  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  She was last seen in our office in January and was asymptomatic at that time.  Her ABIs were normal and arterial duplex demonstrated a patent right SFA stent.  She returns today as an urgent visit.  She recently saw podiatry last week for ongoing foot swelling and pain.  She was concerned about wearing compression stockings given her arterial disease, so ABIs were ordered. Her ABIs show a drastic drop in blood flow in the RLE so she was sent back to our office for workup.  On exam today she is doing well.  She has no wounds of the right lower extremity.  She still has some ongoing right foot/ankle swelling.  She denies any rest pain or claudication.  She mostly walks around her house with a walker.  She endorses occasional tingling of her toes on the right. Current Outpatient Medications  Medication Sig Dispense Refill   apixaban (ELIQUIS) 5 MG TABS tablet Take 1 tablet (5 mg total) by mouth 2 (two) times daily. 180 tablet 1   atorvastatin (LIPITOR) 40 MG tablet Take 1 tablet (40 mg total) by mouth daily at 6 PM. (Patient taking differently: Take 40 mg by mouth daily.) 30 tablet 0   B-D UF III MINI PEN NEEDLES 31G X 5 MM MISC Inject into the skin.     clopidogrel (PLAVIX) 75 MG tablet Take 1 tablet (75 mg total) by mouth daily. 90 tablet 3   Continuous Blood Gluc Sensor (FREESTYLE LIBRE 2 SENSOR) MISC Apply topically every 14 (fourteen) days.     cycloSPORINE (RESTASIS) 0.05 % ophthalmic emulsion 1 drop 2 (two) times daily.     dapagliflozin propanediol (FARXIGA) 10 MG TABS tablet Take 10 mg by mouth daily.      dexamethasone (DECADRON) 0.5 MG tablet Take 1 tablet (0.5 mg total) by mouth daily.     diclofenac Sodium (VOLTAREN) 1 % GEL Apply 4 g topically 4 (four) times daily. 400 g 0   docusate sodium (COLACE) 100 MG capsule Take 1 capsule (100 mg total) by mouth 2 (two) times daily. 60 capsule 0   doxycycline (VIBRA-TABS) 100 MG tablet Take 1 tablet (100 mg total) by mouth 2 (two) times daily. 14 tablet 0   ferrous sulfate 325 (65 FE) MG tablet Take 325 mg by mouth at bedtime.     insulin aspart (NOVOLOG FLEXPEN) 100 UNIT/ML FlexPen Inject 0-7 Units into the skin 3 (three) times daily with meals. Sliding scale insulin     Insulin Glargine (BASAGLAR KWIKPEN) 100 UNIT/ML Inject 24 Units into the skin at bedtime. (Patient taking differently: Inject 18 Units into the skin at bedtime.)     levothyroxine (SYNTHROID) 125 MCG tablet Take 1 tablet (125 mcg total) by mouth daily before breakfast. 30 tablet 2   midodrine (PROAMATINE) 5 MG tablet Take 1 tablet (5 mg total) by mouth 2 (two) times daily with breakfast and lunch.     mirabegron ER (MYRBETRIQ) 50 MG TB24 tablet Take 50 mg by mouth daily.     mupirocin ointment (BACTROBAN) 2 % Apply 1 Application topically  2 (two) times daily. 30 g 2   nitroGLYCERIN (NITROSTAT) 0.4 MG SL tablet Place 0.4 mg under the tongue every 5 (five) minutes as needed for chest pain.     pantoprazole (PROTONIX) 40 MG tablet Take 40 mg by mouth daily.     polyethylene glycol powder (GLYCOLAX/MIRALAX) 17 GM/SCOOP powder Take 1 capful with water (17 g) by mouth daily. (Patient taking differently: Take 17 g by mouth daily as needed (constipation.).) 238 g 0   Propylene Glycol (SYSTANE COMPLETE) 0.6 % SOLN Place 1 drop into both eyes 2 (two) times daily as needed (dry/irritated eyes.).     tamsulosin (FLOMAX) 0.4 MG CAPS capsule Take 1 capsule (0.4 mg total) by mouth daily. 30 capsule 0   trimethoprim (TRIMPEX) 100 MG tablet Take 100 mg by mouth daily.     furosemide (LASIX) 20 MG tablet  Take 1 tablet (20 mg total) by mouth daily as needed for fluid or edema. (Patient not taking: Reported on 09/22/2022) 30 tablet 0   No current facility-administered medications for this visit.    REVIEW OF SYSTEMS (negative unless checked):   Cardiac:  []  Chest pain or chest pressure? []  Shortness of breath upon activity? []  Shortness of breath when lying flat? []  Irregular heart rhythm?  Vascular:  []  Pain in calf, thigh, or hip brought on by walking? []  Pain in feet at night that wakes you up from your sleep? []  Blood clot in your veins? [x]  Leg swelling?  Pulmonary:  []  Oxygen at home? []  Productive cough? []  Wheezing?  Neurologic:  []  Sudden weakness in arms or legs? []  Sudden numbness in arms or legs? []  Sudden onset of difficult speaking or slurred speech? []  Temporary loss of vision in one eye? []  Problems with dizziness?  Gastrointestinal:  []  Blood in stool? []  Vomited blood?  Genitourinary:  []  Burning when urinating? []  Blood in urine?  Psychiatric:  []  Major depression  Hematologic:  []  Bleeding problems? []  Problems with blood clotting?  Dermatologic:  []  Rashes or ulcers?  Constitutional:  []  Fever or chills?  Ear/Nose/Throat:  []  Change in hearing? []  Nose bleeds? []  Sore throat?  Musculoskeletal:  []  Back pain? []  Joint pain? []  Muscle pain?   Physical Examination   Vitals:   11/06/22 0820  BP: (!) 146/72  Pulse: 73  Resp: 20  Temp: 98.6 F (37 C)  TempSrc: Temporal  SpO2: 99%  Weight: 211 lb 9.6 oz (96 kg)  Height: 5\' 7"  (1.702 m)   Body mass index is 33.14 kg/m.  General:  WDWN in NAD; vital signs documented above Gait: Not observed HENT: WNL, normocephalic Pulmonary: normal non-labored breathing , without rales, rhonchi,  wheezing Cardiac: regular Abdomen: soft, NT, no masses Skin: without rashes Vascular Exam/Pulses: palpable femoral pulses bilaterally. Non palpable right popliteal or DP/PT pulses. Monophasic  right DP/PT doppler signals Extremities: without ischemic changes, without gangrene , without cellulitis; without open wounds; healed left BKA Musculoskeletal: no muscle wasting or atrophy  Neurologic: A&O X 3;  No focal weakness or paresthesias are detected Psychiatric:  The pt has Normal affect.  Non-Invasive Vascular imaging   ABI (11/05/2022) R:  ABI: 0.34 (0.87),  PT: mono DP: mono TBI:  0  Healed left BKA   RLE Arterial Duplex (11/06/2022) +-----------+--------+-----+---------------+----------+--------+  RIGHT     PSV cm/sRatioStenosis       Waveform  Comments  +-----------+--------+-----+---------------+----------+--------+  CFA Prox   233          50-74% stenosismonophasic          +-----------+--------+-----+---------------+----------+--------+  DFA       204          50-74% stenosismonophasic          +-----------+--------+-----+---------------+----------+--------+  SFA Prox   217          50-74% stenosismonophasic          +-----------+--------+-----+---------------+----------+--------+  SFA Mid    64                          monophasic          +-----------+--------+-----+---------------+----------+--------+  POP Distal 257          50-74% stenosismonophasic          +-----------+--------+-----+---------------+----------+--------+  ATA Distal 47                          monophasic          +-----------+--------+-----+---------------+----------+--------+  PTA Distal 54                          monophasic          +-----------+--------+-----+---------------+----------+--------+  PERO Distal39                          monophasic          +-----------+--------+-----+---------------+----------+--------+    Right Stent(s):  +---------------+--------+---------------+----------+--------+  SFA-POP       PSV cm/sStenosis       Waveform  Comments   +---------------+--------+---------------+----------+--------+  Prox to Stent  51                     monophasic          +---------------+--------+---------------+----------+--------+  Proximal Stent 45                     monophasic          +---------------+--------+---------------+----------+--------+  Mid Stent      45                     monophasic          +---------------+--------+---------------+----------+--------+  Distal Stent   58                     monophasic          +---------------+--------+---------------+----------+--------+  Distal to [id number]     50-99% stenosismonophasic           Medical Decision Making   ALAIYNA MOESSNER is a 72 y.o. female who presents for urgent follow up  Based on the patient's vascular studies, her ABIs on the right have significantly dropped since January.  Her right ABI has gone from 0.87 to 0.34. Right lower extremity arterial duplex performed today demonstrates a patent SFA pop/stent without stenosis.  There is 50 to 74% stenosis proximal to the stent in the proximal SFA with a PSV of 217. There is also 50-99% stenosis distal to the stent in the popliteal artery with a PSV of 428. 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 This stenosis puts her stent at risk for thrombosis if not intervened on. She currently denies any rest pain, claudication, tissue loss. On exam she has palpable femoral pulses bilaterally. She has nonpalpable right popliteal or pedal pulses. She has  monophasic right DP/PT doppler signals She would benefit from RLE angiogram to maintain a patent stent. We will schedule her for an abdominal aortogram with RLE runoff, possible SFA atherectomy/stenting, and possible tibial angioplasty in the next 1-2 wks with Dr.Clark. She will hold her Eliquis 3 days prior.   Loel Dubonnet PA-C [facility] Office: [phone]  Clinic MD: Randie Heinz

## 2022-11-06 NOTE — Assessment & Plan Note (Signed)
Patient on Plavix and aspirin.  Had recent decrease in her ABI on the right.  Vascular surgery planning on angiogram in the next 1 to 2 weeks.  Patient has not been off of Eliquis for 3 days required to perform angiogram.

## 2022-11-06 NOTE — Assessment & Plan Note (Signed)
Stable

## 2022-11-06 NOTE — Telephone Encounter (Signed)
Called [place] and informed her that her PCP wanted her to go to the ER due to low Hemoglobin level, Pt stated that she did not want to go sit at the ER all day and she would wait until she heard from her DR. She stated that she felt fine and had no symptoms. Pt was advised to go to ER if she started to feel bad. She voiced understanding.

## 2022-11-06 NOTE — Assessment & Plan Note (Signed)
Continue with Basaglar.  Add sliding scale insulin.

## 2022-11-06 NOTE — H&P (Signed)
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  Patient with been seen in follow-up.  She was noted to have worsening ABIs on her right leg.  They had labs drawn.  She noted to have worsening anemia.  Hemoglobin is down to 7.0.  Baseline is around 8.9.  Patient called to go to the ER.  On arrival, hemoglobin repeated and was down to 6.5.  Iron studies showed an iron of 17, TIBC of 381, percent saturation of 5%.  Patient denies being a Jehovah's Witness.  Transfusion ordered.  [facility] hospitalist consulted.   ED Course: HgB 6.5, hemoccult negative  Review of Systems:  Review of Systems  Constitutional: Negative.   HENT: Negative.    Eyes: Negative.   Respiratory: Negative.    Cardiovascular: Negative.   Gastrointestinal: Negative.  Negative for blood in stool.  Genitourinary: Negative.   Musculoskeletal: Negative.   Skin: Negative.   Neurological:  Positive for dizziness.       Lightheaded  Endo/Heme/Allergies: Negative.   Psychiatric/Behavioral: Negative.    All other systems reviewed and are negative.   Past Medical History:  Diagnosis Date   CAD S/P percutaneous coronary angioplasty 08/2013   100% mRCA - PCI Integrity Resolute DES 3.0 mm x 38 mm - 3.35 mm; PTCA of RPA V 2.0 mm x 15 mm   CHF (congestive heart failure)  (HCC)    Cholesteatoma    right   Diabetes mellitus type 2 in obese    On insulin and Invokana   History of osteomyelitis L 5th Toe all 05/2019   s/p Partial Ray Amputation with partial closure; 6 wks Abx & LSFA Atherectomy/DEB PTA with Stent for focal dissection.   Hyperlipidemia with target LDL less than 70    Hypothyroidism (acquired)    Mild essential hypertension    Obesity (BMI 30-39.9) 11/17/2013   PAD (peripheral artery disease) (HCC) 05/26/2019   05/26/19: Abd AoGram- BLE runoff -> L SFA orbital atherectomy - PTA w/ DEB & Stent 6 x 40 Luttonix (for focal dissection) - patent Pop A with 3 V runoff. LEA Dopplers 01/03/2020: RABI (prev) 0.68 (0.53)/ RTBI (prev) 0.58 (0.33); LABI (prev) 0.80 (0.64), LTBI (prev) 0.64 (0.51); R mSFA ~50-74%, L mSFA 50-74%. Patent Prox SFA stent < 49% stenosis   Panhypopituitarism (HCC)    ST elevation myocardial infarction (STEMI) of inferior wall, subsequent episode of care (HCC) 08/2013   80% branch of D1, 40% mid AV groove circumflex, 100% RCA with subacute thrombus -- thrombus extending into RPA V with 100% occlusion after initial angioplasty of mid RCA ;; Post MI ECHO 6/9/'15: EF 50-55%, mild LVH with moderate HK of inferior wall, Gr1 DD, mild LA dilation; mildly reduced RV function    Past Surgical History:  Procedure Laterality Date   ABDOMINAL AORTOGRAM W/LOWER EXTREMITY N/A 05/26/2019   Procedure: ABDOMINAL AORTOGRAM W/LOWER EXTREMITY;  Surgeon: Cephus Shelling, MD;  Location[facility]  INVASIVE CV LAB;  Service: Cardiovascular;  Laterality: N/A;   ABDOMINAL AORTOGRAM W/LOWER EXTREMITY N/A 11/15/2020   Procedure: ABDOMINAL AORTOGRAM W/LOWER EXTREMITY;  Surgeon: Cephus Shelling, MD;  Location: [facility] INVASIVE CV LAB;  Service: Cardiovascular;  Laterality: N/A;   ABDOMINAL AORTOGRAM W/LOWER EXTREMITY N/A 04/18/2021   Procedure: ABDOMINAL AORTOGRAM W/LOWER EXTREMITY;  Surgeon: Cephus Shelling, MD;  Location: [facility] INVASIVE CV LAB;  Service:  Cardiovascular;  Laterality: N/A;   ABDOMINAL AORTOGRAM W/LOWER EXTREMITY Left 07/11/2021   Procedure: ABDOMINAL AORTOGRAM W/LOWER EXTREMITY;  Surgeon: Cephus Shelling, MD;  Location: [facility] INVASIVE CV LAB;  Service: Cardiovascular;  Laterality: Left;   AMPUTATION Left 05/25/2019   Procedure: AMPUTATION RAY 5th;  Surgeon: Vivi Barrack, DPM;  Location: [facility] OR;  Service: Podiatry;  Laterality: Left;   AMPUTATION Left 01/14/2022   Procedure: LEFT BELOW THE KNEE AMPUTATION;  Surgeon: Terance Hart, MD;  Location: [facility] OR;  Service: Orthopedics;  Laterality: Left;  LENGTH OF SURGERY: 90 MINUTES   APPLICATION OF WOUND VAC Left 01/14/2022   Procedure: WOUND VAC PLACEMENT;  Surgeon: Terance Hart, MD;  Location: [facility] OR;  Service: Orthopedics;  Laterality: Left;   BONE BIOPSY Left 04/24/2021   Procedure: BONE BIOPSY;  Surgeon: Asencion Islam, DPM;  Location: [facility] OR;  Service: Podiatry;  Laterality: Left;  left ankle/fibula   BONE BIOPSY Left 07/15/2021   Procedure: LEFT FOOT BONE BIOPSY;  Surgeon: Vivi Barrack, DPM;  Location: [facility] OR;  Service: Podiatry;  Laterality: Left;   Cardiac Event Monitor  July-August 2015   Sinus rhythm with PVCs   CHOLECYSTECTOMY     COLONOSCOPY N/A 08/31/2013   Procedure: COLONOSCOPY;  Surgeon: Charna Elizabeth, MD;  Location: [facility] ENDOSCOPY;  Service: Endoscopy;  Laterality: N/A;   CORONARY STENT INTERVENTION N/A 11/19/2020   Procedure: CORONARY STENT INTERVENTION;  Surgeon: Yvonne Kendall, MD;  Location: [facility] INVASIVE CV LAB;  Service: Cardiovascular;  Laterality: N/A;   ESOPHAGOGASTRODUODENOSCOPY N/A 09/01/2013   Procedure: ESOPHAGOGASTRODUODENOSCOPY (EGD);  Surgeon: Theda Belfast, MD;  Location: [facility] ENDOSCOPY;  Service: Endoscopy;  Laterality: N/A;  bedside   ESOPHAGOGASTRODUODENOSCOPY (EGD) WITH PROPOFOL N/A 05/26/2021   Procedure: ESOPHAGOGASTRODUODENOSCOPY (EGD) WITH PROPOFOL;  Surgeon: Jeani Hawking, MD;  Location: [facility] ENDOSCOPY;  Service:  Gastroenterology;  Laterality: N/A;   EYE SURGERY Bilateral    bilateral cataracts   INCISION AND DRAINAGE Left 04/24/2021   Procedure: INCISION AND DRAINAGE;  Surgeon: Asencion Islam, DPM;  Location: [facility] OR;  Service: Podiatry;  Laterality: Left;   IRRIGATION AND DEBRIDEMENT FOOT Left 07/15/2021   Procedure: IRRIGATION AND DEBRIDEMENT FOOT;  Surgeon: Vivi Barrack, DPM;  Location: [facility] OR;  Service: Podiatry;  Laterality: Left;   LEFT HEART CATH AND CORONARY ANGIOGRAPHY N/A 11/19/2020   Procedure: LEFT HEART CATH AND CORONARY ANGIOGRAPHY;  Surgeon: Yvonne Kendall, MD;  [facility]: [facility] INVASIVE CV LAB;  Service: Cardiovascular;  Laterality: N/A;   LEFT HEART CATHETERIZATION WITH CORONARY ANGIOGRAM N/A 08/30/2013   Procedure: LEFT HEART CATHETERIZATION WITH CORONARY ANGIOGRAM;  Surgeon: Marykay Lex, MD;  Location: [facility] CATH LAB: 100% mRCA (thrombus - extends to RPAV), 80% D1, 40% AVG Cx.   PERCUTANEOUS CORONARY STENT INTERVENTION (PCI-S)  08/30/2013   Procedure: PERCUTANEOUS CORONARY STENT INTERVENTION (PCI-S);  Surgeon: Marykay Lex, MD;  Location: [facility] CATH LAB;  Integrity Resolute DES 2.0 mm x 38 mm -- 3.35 mm.; PTCA of proximal RPA V. - 3.0 mm x 15 mm balloon   PERIPHERAL VASCULAR ATHERECTOMY  05/26/2019   Procedure:  PERIPHERAL VASCULAR ATHERECTOMY;  Surgeon: Cephus Shelling, MD;  Location: [facility] INVASIVE CV LAB;  Service: Cardiovascular;;  Left SFA   PERIPHERAL VASCULAR BALLOON ANGIOPLASTY Left 07/11/2021   Procedure: PERIPHERAL VASCULAR BALLOON ANGIOPLASTY;  Surgeon: Cephus Shelling, MD;  Location: [facility] INVASIVE CV LAB;  Service: Cardiovascular;  Laterality: Left;  PT TRUNK / AT   PERIPHERAL VASCULAR INTERVENTION  05/26/2019   Procedure: PERIPHERAL VASCULAR INTERVENTION;  Surgeon: Cephus Shelling, MD;  Location: [facility] INVASIVE CV LAB;  Service: Cardiovascular;;  Left SFA   PERIPHERAL VASCULAR INTERVENTION Right 11/15/2020   Procedure: PERIPHERAL VASCULAR INTERVENTION;  Surgeon:  Cephus Shelling, MD;  Location: [facility] INVASIVE CV LAB;  Service: Cardiovascular;  Laterality: Right;  Superficial Femoral Artery   PERIPHERAL VASCULAR INTERVENTION Left 07/11/2021   Procedure: PERIPHERAL VASCULAR INTERVENTION;  Surgeon: Cephus Shelling, MD;  Location: [facility] INVASIVE CV LAB;  Service: Cardiovascular;  Laterality: Left;  SFA   PITUITARY SURGERY     TEE WITHOUT CARDIOVERSION N/A 04/26/2021   Procedure: TRANSESOPHAGEAL ECHOCARDIOGRAM (TEE);  Surgeon: Jodelle Red, MD;  Location: [facility] ENDOSCOPY;  Service: Cardiovascular;  Laterality: N/A;   TRANSTHORACIC ECHOCARDIOGRAM  08/30/2013   mild LVH. EF 50-55%. Moderate HK of the entire inferior myocardium. GR 1 DD. Mild LA dilation. Mildly reduced RV function   TYMPANOMASTOIDECTOMY Right 12/28/2017   Procedure: RIGHT TYMPANOMASTOIDECTOMY;  Surgeon: Newman Pies, MD;  Location: [facility];  Service: ENT;  Laterality: Right;     reports that she has quit smoking. She has never been exposed to tobacco smoke. She has never used smokeless tobacco. She reports that she does not drink alcohol and does not use drugs.  Allergies  Allergen Reactions   Strawberry Extract Itching, Swelling and Anaphylaxis    Mouth swells and gets itchy    Family History  Problem Relation Age of Onset   Cancer Mother 61       multiple myeloma   Heart attack Father 11   Cancer Sister    Alzheimer's disease Maternal Grandmother     Prior to Admission medications   Medication Sig Start Date End Date Taking? Authorizing Provider  apixaban (ELIQUIS) 5 MG TABS tablet Take 1 tablet (5 mg total) by mouth 2 (two) times daily. 09/16/22  Yes Marykay Lex, MD  atorvastatin (LIPITOR) 40 MG tablet Take 1 tablet (40 mg total) by mouth daily at 6 PM. Patient taking differently: Take 40 mg by mouth daily. 09/03/13  Yes Lonia Blood, MD  clopidogrel (PLAVIX) 75 MG tablet Take 1 tablet (75 mg total) by mouth daily. 10/15/22  Yes Marykay Lex,  MD  cycloSPORINE (RESTASIS) 0.05 % ophthalmic emulsion 1 drop 2 (two) times daily.   Yes [provider]  dapagliflozin propanediol (FARXIGA) 10 MG TABS tablet Take 10 mg by mouth daily.   Yes [provider]  dexamethasone (DECADRON) 0.5 MG tablet Take 1 tablet (0.5 mg total) by mouth daily. 01/30/22 01/30/23 Yes Love, Evlyn Kanner, PA-C  diclofenac Sodium (VOLTAREN) 1 % GEL Apply 4 g topically 4 (four) times daily. 01/30/22  Yes Love, Evlyn Kanner, PA-C  ferrous sulfate 325 (65 FE) MG tablet Take 325 mg by mouth at bedtime.   Yes [provider]  insulin aspart (NOVOLOG FLEXPEN) 100 UNIT/ML FlexPen Inject 0-7 Units into the skin 3 (three) times daily with meals. Sliding scale insulin   Yes [provider]  Insulin Glargine (BASAGLAR KWIKPEN) 100 UNIT/ML Inject 24 Units into the skin at bedtime. Patient taking  differently: Inject 18 Units into the skin at bedtime. 01/30/22  Yes Love, Evlyn Kanner, PA-C  levothyroxine (SYNTHROID) 125 MCG tablet Take 1 tablet (125 mcg total) by mouth daily before breakfast. 11/21/20  Yes Baglia, Corrina, PA-C  midodrine (PROAMATINE) 5 MG tablet Take 1 tablet (5 mg total) by mouth 2 (two) times daily with breakfast and lunch. 01/31/22  Yes Love, Pamela S, PA-C  mirabegron ER (MYRBETRIQ) 50 MG TB24 tablet Take 50 mg by mouth daily.   Yes [provider]  mupirocin ointment (BACTROBAN) 2 % Apply 1 Application topically 2 (two) times daily. 08/29/22  Yes Vivi Barrack, DPM  nitroGLYCERIN (NITROSTAT) 0.4 MG SL tablet Place 0.4 mg under the tongue every 5 (five) minutes as needed for chest pain. 08/23/21  Yes [provider]  pantoprazole (PROTONIX) 40 MG tablet Take 40 mg by mouth daily.   Yes [provider]  polyethylene glycol powder (GLYCOLAX/MIRALAX) 17 GM/SCOOP powder Take 1 capful with water (17 g) by mouth daily. Patient taking differently: Take 17 g by mouth daily as needed (constipation.). 06/04/21  Yes Rai, Ripudeep  K, MD  Propylene Glycol (SYSTANE COMPLETE) 0.6 % SOLN Place 1 drop into both eyes 2 (two) times daily as needed (dry/irritated eyes.).   Yes [provider]  B-D UF III MINI PEN NEEDLES 31G X 5 MM MISC Inject into the skin. 05/28/20   [provider]  Continuous Blood Gluc Sensor (FREESTYLE LIBRE 2 SENSOR) MISC Apply topically every 14 (fourteen) days. 06/12/20   [provider]  furosemide (LASIX) 20 MG tablet Take 1 tablet (20 mg total) by mouth daily as needed for fluid or edema. Patient not taking: Reported on 09/22/2022 06/03/21 06/03/22  Rai, Delene Ruffini, MD  trimethoprim (TRIMPEX) 100 MG tablet Take 100 mg by mouth daily. Patient not taking: Reported on 11/06/2022    [provider]    Physical Exam: Vitals:   11/06/22 2058 11/06/22 2115 11/06/22 2116 11/06/22 2130  BP: (!) 137/58 (!) 149/70 (!) 149/70   Pulse: 76 78 76 81  Resp: 18  16   Temp: 99.3 F (37.4 C)  99.2 F (37.3 C)   TempSrc: Oral  Oral   SpO2: 99% 99%  100%  Weight:      Height:        Physical Exam Vitals and nursing note reviewed.  Constitutional:      General: She is not in acute distress.    Appearance: She is obese. She is not toxic-appearing or diaphoretic.  HENT:     Head: Normocephalic and atraumatic.     Nose: Nose normal.  Eyes:     General: No scleral icterus. Cardiovascular:     Rate and Rhythm: Normal rate and regular rhythm.  Pulmonary:     Effort: Pulmonary effort is normal.     Breath sounds: Normal breath sounds.  Abdominal:     General: Bowel sounds are normal. There is no distension.     Palpations: Abdomen is soft.     Tenderness: There is no abdominal tenderness.  Musculoskeletal:     Comments: Left BKA  Skin:    General: Skin is warm and dry.     Capillary Refill: Capillary refill takes less than 2 seconds.  Neurological:     Mental Status: She is alert and oriented to person, place, and time.      Labs on Admission: I have personally reviewed  following labs and imaging studies  CBC: Recent Labs  Lab 11/05/22 1209 11/06/22 1755  WBC 9.3 10.8*  NEUTROABS 7.2*  --   HGB 7.0* 6.5*  HCT 24.5* 24.4*  MCV 96 100.8*  PLT 570* 540*   Basic Metabolic Panel: Recent Labs  Lab 11/06/22 1755  NA 141  K 3.5  CL 105  CO2 25  GLUCOSE 144*  BUN 14  CREATININE 0.89  CALCIUM 8.4*   GFR: Estimated Creatinine Clearance: 69.8 mL/min (by C-G formula based on SCr of 0.89 mg/dL). Liver Function Tests: Recent Labs  Lab 11/06/22 1755  AST 13*  ALT 14  ALKPHOS 84  BILITOT 0.5  PROT 6.2*  ALBUMIN 2.9*    Anemia Panel: Recent Labs    11/06/22 2046  VITAMINB12 286  FOLATE 11.2  TIBC 381  IRON 17*   Urine analysis:    Component Value Date/Time   COLORURINE YELLOW 01/28/2022 1100   APPEARANCEUR CLEAR 01/28/2022 1100   LABSPEC 1.018 01/28/2022 1100   PHURINE 5.0 01/28/2022 1100   GLUCOSEU >=500 (A) 01/28/2022 1100   HGBUR NEGATIVE 01/28/2022 1100   BILIRUBINUR NEGATIVE 01/28/2022 1100   KETONESUR NEGATIVE 01/28/2022 1100   PROTEINUR NEGATIVE 01/28/2022 1100   NITRITE NEGATIVE 01/28/2022 1100   LEUKOCYTESUR TRACE (A) 01/28/2022 1100    Radiological Exams on Admission: I have personally reviewed images VAS [place] LOWER EXTREMITY ARTERIAL DUPLEX  Result Date: 11/06/2022 LOWER EXTREMITY ARTERIAL DUPLEX STUDY Patient Name:  LAQUITHA DEMARTINO  Date of Exam:   11/06/2022 Medical Rec #: [id number]          Accession #:    [id number] Date of Birth: 10-09-1950           Patient Gender: F Patient Age:   8 years Exam Location:  [facility] Procedure:      VAS [place] LOWER EXTREMITY ARTERIAL DUPLEX Referring Phys: Cristal Deer DICKSON --------------------------------------------------------------------------------  Indications: Peripheral artery disease. High Risk Factors: Hypertension, hyperlipidemia.  Vascular Interventions: 07/2019: Left SFA stent                         10/2020: Right SFA stent                          07/11/21: Left SFA angioplasty and stent; ATA ,TPT ,PTA                         angioplasty 5th ray amp 2021.                         01/14/2022: Left BKA. Current ABI:            R 0.34 L BKA Performing Technologist: Argentina Ponder RVS  Examination Guidelines: A complete evaluation includes B-mode imaging, spectral Doppler, color Doppler, and power Doppler as needed of all accessible portions of each vessel. Bilateral testing is considered an integral part of a complete examination. Limited examinations for reoccurring indications may be performed as noted.  +-----------+--------+-----+---------------+----------+--------+ RIGHT      PSV cm/sRatioStenosis       Waveform  Comments +-----------+--------+-----+---------------+----------+--------+ CFA Prox   233          50-74% stenosismonophasic         +-----------+--------+-----+---------------+----------+--------+ DFA        204          50-74% stenosismonophasic         +-----------+--------+-----+---------------+----------+--------+ SFA Prox   217  50-74% stenosismonophasic         +-----------+--------+-----+---------------+----------+--------+ SFA Mid    64                          monophasic         +-----------+--------+-----+---------------+----------+--------+ POP Distal 257          50-74% stenosismonophasic         +-----------+--------+-----+---------------+----------+--------+ ATA Distal 47                          monophasic         +-----------+--------+-----+---------------+----------+--------+ PTA Distal 54                          monophasic         +-----------+--------+-----+---------------+----------+--------+ PERO Distal39                          monophasic         +-----------+--------+-----+---------------+----------+--------+  Right Stent(s): +---------------+--------+---------------+----------+--------+ SFA-POP        PSV cm/sStenosis       Waveform  Comments  +---------------+--------+---------------+----------+--------+ Prox to Stent  51                     monophasic         +---------------+--------+---------------+----------+--------+ Proximal Stent 45                     monophasic         +---------------+--------+---------------+----------+--------+ Mid Stent      45                     monophasic         +---------------+--------+---------------+----------+--------+ Distal Stent   58                     monophasic         +---------------+--------+---------------+----------+--------+ Distal to [id number]     50-99% stenosismonophasic         +---------------+--------+---------------+----------+--------+    Summary: Right: 50-74% stenosis noted in the common femoral artery. 50-74% stenosis noted in the deep femoral artery. 50-74% stenosis noted in the superficial femoral artery. 50-74% stenosis noted in the popliteal artery. Patent mid superficial femoral artery to popliteal artery stent with velocities in the native outflow (popliteal) suggesting a 50-99% stenosis.   See table(s) above for measurements and observations. Electronically signed by Lemar Livings MD on 11/06/2022 at 12:00:15 PM.    Final    VAS [place] ABI WITH/WO TBI  Result Date: 11/05/2022  LOWER EXTREMITY DOPPLER STUDY Patient Name:  SHINEQUA BAYONA  Date of Exam:   11/05/2022 Medical Rec #: [id number]          Accession #:    [id number] Date of Birth: 18-Feb-1951           Patient Gender: F Patient Age:   61 years Exam Location:  [facility] Procedure:      VAS [place] ABI WITH/WO TBI Referring Phys: Ovid Curd --------------------------------------------------------------------------------  Indications: Peripheral artery disease. High Risk         Hypertension, hyperlipidemia, Diabetes, past history of Factors:          smoking.  Vascular Interventions: 07/2019: Left SFA stent  10/2020: Right SFA stent                         07/11/21:  Left SFA angioplasty and stent; ATA ,TPT ,PTA                         angioplasty 5th ray amp 2021.                         01/14/2022: Left BKA. Comparison Study: 04/04/2022 ABI/TBI- Right=0.87/0.58, left=BKA Performing Technologist: Gertie Fey MHA, RVT, RDCS, RDMS  Examination Guidelines: A complete evaluation includes at minimum, Doppler waveform signals and systolic blood pressure reading at the level of bilateral brachial, anterior tibial, and posterior tibial arteries, when vessel segments are accessible. Bilateral testing is considered an integral part of a complete examination. Photoelectric Plethysmograph (PPG) waveforms and toe systolic pressure readings are included as required and additional duplex testing as needed. Limited examinations for reoccurring indications may be performed as noted.  ABI Findings: +---------+------------------+-----+----------+--------+ Right    Rt Pressure (mmHg)IndexWaveform  Comment  +---------+------------------+-----+----------+--------+ Brachial 162                                       +---------+------------------+-----+----------+--------+ PTA      47                0.29 monophasic         +---------+------------------+-----+----------+--------+ DP       55                0.34 monophasic         +---------+------------------+-----+----------+--------+ Great Toe                       Absent             +---------+------------------+-----+----------+--------+ +---------+------------------+-----+--------+-------+ Left     Lt Pressure (mmHg)IndexWaveformComment +---------+------------------+-----+--------+-------+ Brachial 145                                    +---------+------------------+-----+--------+-------+ PTA                                     BKA     +---------+------------------+-----+--------+-------+ DP                                      BKA      +---------+------------------+-----+--------+-------+ Great Toe                               BKA     +---------+------------------+-----+--------+-------+ +-------+-----------+-----------+------------+------------+ ABI/TBIToday's ABIToday's TBIPrevious ABIPrevious TBI +-------+-----------+-----------+------------+------------+ Right  0.34       0.00       0.87        0.58         +-------+-----------+-----------+------------+------------+ Left   BKA        BKA        BKA         BKA          +-------+-----------+-----------+------------+------------+  Right ABIs  and TBIs appear decreased compared to prior study on 04/04/22.  Summary: Right: Resting right ankle-brachial index indicates severe, borderline critical right lower extremity arterial disease. The right toe-brachial index is abnormal. *See table(s) above for measurements and observations.  Electronically signed by Lemar Livings MD on 11/05/2022 at 1:36:30 PM.    Final     EKG: My personal interpretation of EKG shows: no EKG to review  Assessment/Plan Principal Problem:   Symptomatic anemia Active Problems:   Iron deficiency anemia   Panhypopituitarism (HCC)   Diabetes mellitus, type 2 (HCC)   Essential hypertension   Obesity (BMI 30-39.9)   PAD (peripheral artery disease) (HCC)   Chronic systolic CHF (congestive heart failure) (HCC)   Atrial fibrillation (HCC)    Assessment and Plan: * Symptomatic anemia Observation MedSurg bed.  Transfuse with 2 units of blood.  Will give 40 mg of IV Lasix in between units of blood.  Repeat CBC in the morning.  Patient should be ready to be discharged after blood transfusion.  Iron deficiency anemia Patient with known iron deficiency anemia.  Continue with oral iron.  Atrial fibrillation (HCC) Stable.  Continue with Eliquis.  Chronic systolic CHF (congestive heart failure) (HCC) Stable.  Will give IV Lasix in between units of blood.  PAD (peripheral artery disease)  (HCC) Patient on Plavix and aspirin.  Had recent decrease in her ABI on the right.  Vascular surgery planning on angiogram in the next 1 to 2 weeks.  Patient has not been off of Eliquis for 3 days required to perform angiogram.  Obesity (BMI 30-39.9) Chronic.  BMI of 33.  Essential hypertension Stable.  Diabetes mellitus, type 2 (HCC) Continue with Basaglar.  Add sliding scale insulin.  Panhypopituitarism (HCC) Continue with Decadron.  Chronic.   DVT prophylaxis: Eliquis Code Status: Full Code Family Communication: no family at bedside  Disposition Plan: return home  Consults called: none  Admission status: Observation, Med-Surg   Carollee Herter, DO [facility] 11/06/2022, 11:13 PM

## 2022-11-06 NOTE — Telephone Encounter (Signed)
  Chief Complaint: How/where do I get a blood transfusion? Symptoms: low h/h Frequency:  Pertinent Negatives: Patient denies dizziness Disposition: [x] ED /[] Urgent Care (no appt availability in office) / [] Appointment(In office/virtual)/ []  [facility]/ [] Home Care/ [] Refused Recommended Disposition /[]  Mobile Bus/ []  Follow-up with PCP Additional Notes: Pt was told to go to ED for blood. H/H are low. Pt was wondering what the procedure would be for that. Pt will go to hospital ED for care.   Reason for Disposition  General information question, no triage required and triager able to answer question  Answer Assessment - Initial Assessment Questions 1. REASON FOR CALL or QUESTION: "What is your reason for calling today?" or "How can I best help you?" or "What question do you have that I can help answer?"     How do I get a blood transfusion?  Protocols used: Information Only Call - No Triage-A-AH

## 2022-11-06 NOTE — ED Notes (Signed)
ED TO INPATIENT HANDOFF REPORT  ED Nurse Name and Phone #: Arcelia Jew [phone]  S Name/Age/Gender Tammy Good 72 y.o. female Room/Bed: 039C/039C  Code Status   Code Status: Full Code  Home/SNF/Other Home Patient oriented to: self, place, time, and situation Is this baseline? Yes   Triage Complete: Triage complete  Chief Complaint Symptomatic anemia [D64.9]  Triage Note Pt reports she was sent here by her PCP for low hemoglobin, states it was " 7 point something." She denies blood in stool or urine but she does report feeling lightheaded. She takes Eliquis and Plavix.  She originally went to see her PCP for right leg swelling.    Allergies Allergies  Allergen Reactions   Strawberry Extract Itching, Swelling and Anaphylaxis    Mouth swells and gets itchy    Level of Care/Admitting Diagnosis ED Disposition     ED Disposition  Admit   Condition  --   Comment  Hospital Area: [facility] [[id number]]  Level of Care: Med-Surg [16]  May place patient in observation at [facility] or [facility] if equivalent level of care is available:: No  Covid Evaluation: Asymptomatic - no recent exposure (last 10 days) testing not required  Diagnosis: Symptomatic anemia [9562130]  Admitting Physician: Imogene Burn, ERIC [[id number]]  Attending Physician: Imogene Burn, ERIC [[id number]]          B Medical/Surgery History Past Medical History:  Diagnosis Date   CAD S/P percutaneous coronary angioplasty 08/2013   100% mRCA - PCI Integrity Resolute DES 3.0 mm x 38 mm - 3.35 mm; PTCA of RPA V 2.0 mm x 15 mm   CHF (congestive heart failure) (HCC)    Cholesteatoma    right   Diabetes mellitus type 2 in obese    On insulin and Invokana   History of osteomyelitis L 5th Toe all 05/2019   s/p Partial Ray Amputation with partial closure; 6 wks Abx & LSFA Atherectomy/DEB PTA with Stent for focal dissection.   Hyperlipidemia with target LDL less than 70    Hypothyroidism (acquired)    Mild  essential hypertension    Obesity (BMI 30-39.9) 11/17/2013   PAD (peripheral artery disease) (HCC) 05/26/2019   05/26/19: Abd AoGram- BLE runoff -> L SFA orbital atherectomy - PTA w/ DEB & Stent 6 x 40 Luttonix (for focal dissection) - patent Pop A with 3 V runoff. LEA Dopplers 01/03/2020: RABI (prev) 0.68 (0.53)/ RTBI (prev) 0.58 (0.33); LABI (prev) 0.80 (0.64), LTBI (prev) 0.64 (0.51); R mSFA ~50-74%, L mSFA 50-74%. Patent Prox SFA stent < 49% stenosis   Panhypopituitarism (HCC)    ST elevation myocardial infarction (STEMI) of inferior wall, subsequent episode of care (HCC) 08/2013   80% branch of D1, 40% mid AV groove circumflex, 100% RCA with subacute thrombus -- thrombus extending into RPA V with 100% occlusion after initial angioplasty of mid RCA ;; Post MI ECHO 6/9/'15: EF 50-55%, mild LVH with moderate HK of inferior wall, Gr1 DD, mild LA dilation; mildly reduced RV function   Past Surgical History:  Procedure Laterality Date   ABDOMINAL AORTOGRAM W/LOWER EXTREMITY N/A 05/26/2019   Procedure: ABDOMINAL AORTOGRAM W/LOWER EXTREMITY;  Surgeon: Cephus Shelling, MD;  [facility];  Service: Cardiovascular;  Laterality: N/A;   ABDOMINAL AORTOGRAM W/LOWER EXTREMITY N/A 11/15/2020   Procedure: ABDOMINAL AORTOGRAM W/LOWER EXTREMITY;  Surgeon: Cephus Shelling, MD;  [facility];  Service: Cardiovascular;  Laterality: N/A;   ABDOMINAL AORTOGRAM W/LOWER EXTREMITY N/A  04/18/2021   Procedure: ABDOMINAL AORTOGRAM W/LOWER EXTREMITY;  Surgeon: Cephus Shelling, MD;  Location: [facility] INVASIVE CV LAB;  Service: Cardiovascular;  Laterality: N/A;   ABDOMINAL AORTOGRAM W/LOWER EXTREMITY Left 07/11/2021   Procedure: ABDOMINAL AORTOGRAM W/LOWER EXTREMITY;  Surgeon: Cephus Shelling, MD;  [facility];  Service: Cardiovascular;  Laterality: Left;   AMPUTATION Left 05/25/2019   Procedure: AMPUTATION RAY 5th;  Surgeon: Vivi Barrack, DPM;  Location: [facility]  OR;  Service: Podiatry;  Laterality: Left;   AMPUTATION Left 01/14/2022   Procedure: LEFT BELOW THE KNEE AMPUTATION;  Surgeon: Terance Hart, MD;  Location: [facility] OR;  Service: Orthopedics;  Laterality: Left;  LENGTH OF SURGERY: 90 MINUTES   APPLICATION OF WOUND VAC Left 01/14/2022   Procedure: WOUND VAC PLACEMENT;  Surgeon: Terance Hart, MD;  Location: [facility] OR;  Service: Orthopedics;  Laterality: Left;   BONE BIOPSY Left 04/24/2021   Procedure: BONE BIOPSY;  Surgeon: Asencion Islam, DPM;  [facility] OR;  Service: Podiatry;  Laterality: Left;  left ankle/fibula   BONE BIOPSY Left 07/15/2021   Procedure: LEFT FOOT BONE BIOPSY;  Surgeon: Vivi Barrack, DPM;  [facility];  Service: Podiatry;  Laterality: Left;   Cardiac Event Monitor  July-August 2015   Sinus rhythm with PVCs   CHOLECYSTECTOMY     COLONOSCOPY N/A 08/31/2013   Procedure: COLONOSCOPY;  Surgeon: Charna Elizabeth, MD;  Location: [facility] ENDOSCOPY;  Service: Endoscopy;  Laterality: N/A;   CORONARY STENT INTERVENTION N/A 11/19/2020   Procedure: CORONARY STENT INTERVENTION;  Surgeon: Yvonne Kendall, MD;  [facility];  Service: Cardiovascular;  Laterality: N/A;   ESOPHAGOGASTRODUODENOSCOPY N/A 09/01/2013   Procedure: ESOPHAGOGASTRODUODENOSCOPY (EGD);  Surgeon: Theda Belfast, MD;  Location: [facility] ENDOSCOPY;  Service: Endoscopy;  Laterality: N/A;  bedside   ESOPHAGOGASTRODUODENOSCOPY (EGD) WITH PROPOFOL N/A 05/26/2021   Procedure: ESOPHAGOGASTRODUODENOSCOPY (EGD) WITH PROPOFOL;  Surgeon: Jeani Hawking, MD;  Location: [facility] ENDOSCOPY;  Service: Gastroenterology;  Laterality: N/A;   EYE SURGERY Bilateral    bilateral cataracts   INCISION AND DRAINAGE Left 04/24/2021   Procedure: INCISION AND DRAINAGE;  Surgeon: Asencion Islam, DPM;  [facility];  Service: Podiatry;  Laterality: Left;   IRRIGATION AND DEBRIDEMENT FOOT Left 07/15/2021   Procedure: IRRIGATION AND DEBRIDEMENT FOOT;  Surgeon: Vivi Barrack,  DPM;  [facility];  Service: Podiatry;  Laterality: Left;   LEFT HEART CATH AND CORONARY ANGIOGRAPHY N/A 11/19/2020   Procedure: LEFT HEART CATH AND CORONARY ANGIOGRAPHY;  Surgeon: Yvonne Kendall, MD;  [facility];  Service: Cardiovascular;  Laterality: N/A;   LEFT HEART CATHETERIZATION WITH CORONARY ANGIOGRAM N/A 08/30/2013   Procedure: LEFT HEART CATHETERIZATION WITH CORONARY ANGIOGRAM;  Surgeon: Marykay Lex, MD;  Location: [facility] CATH LAB: 100% mRCA (thrombus - extends to RPAV), 80% D1, 40% AVG Cx.   PERCUTANEOUS CORONARY STENT INTERVENTION (PCI-S)  08/30/2013   Procedure: PERCUTANEOUS CORONARY STENT INTERVENTION (PCI-S);  Surgeon: Marykay Lex, MD;  Location: [facility] CATH LAB;  Integrity Resolute DES 2.0 mm x 38 mm -- 3.35 mm.; PTCA of proximal RPA V. - 3.0 mm x 15 mm balloon   PERIPHERAL VASCULAR ATHERECTOMY  05/26/2019   Procedure: PERIPHERAL VASCULAR ATHERECTOMY;  Surgeon: Cephus Shelling, MD;  [facility];  Service: Cardiovascular;;  Left SFA   PERIPHERAL VASCULAR BALLOON ANGIOPLASTY Left 07/11/2021   Procedure: PERIPHERAL VASCULAR BALLOON ANGIOPLASTY;  Surgeon: Cephus Shelling, MD;  [facility];  Service: Cardiovascular;  Laterality: Left;  PT TRUNK / AT   PERIPHERAL VASCULAR INTERVENTION  05/26/2019   Procedure: PERIPHERAL VASCULAR INTERVENTION;  Surgeon: Cephus Shelling, MD;  [facility];  Service: Cardiovascular;;  Left SFA   PERIPHERAL VASCULAR INTERVENTION Right 11/15/2020   Procedure: PERIPHERAL VASCULAR INTERVENTION;  Surgeon: Cephus Shelling, MD;  [facility];  Service: Cardiovascular;  Laterality: Right;  Superficial Femoral Artery   PERIPHERAL VASCULAR INTERVENTION Left 07/11/2021   Procedure: PERIPHERAL VASCULAR INTERVENTION;  Surgeon: Cephus Shelling, MD;  [facility];  Service: Cardiovascular;  Laterality: Left;  SFA   PITUITARY SURGERY     TEE  WITHOUT CARDIOVERSION N/A 04/26/2021   Procedure: TRANSESOPHAGEAL ECHOCARDIOGRAM (TEE);  Surgeon: Jodelle Red, MD;  [facility] ENDOSCOPY;  Service: Cardiovascular;  Laterality: N/A;   TRANSTHORACIC ECHOCARDIOGRAM  08/30/2013   mild LVH. EF 50-55%. Moderate HK of the entire inferior myocardium. GR 1 DD. Mild LA dilation. Mildly reduced RV function   TYMPANOMASTOIDECTOMY Right 12/28/2017   Procedure: RIGHT TYMPANOMASTOIDECTOMY;  Surgeon: Newman Pies, MD;  Location:  SURGERY CENTER;  Service: ENT;  Laterality: Right;     A IV Location/Drains/Wounds Patient Lines/Drains/Airways Status     Active Line/Drains/Airways     Name Placement date Placement time Site Days   Peripheral IV 11/06/22 18 G 1" Anterior;Proximal;Right Forearm 11/06/22  2045  Forearm  less than 1   Wound / Incision (Open or Dehisced) 01/22/22 Non-pressure wound Sacrum Mid 3x.0.4 mm open area, red base with no drainage 01/22/22  1025  Sacrum  288   Wound / Incision (Open or Dehisced) 01/28/22 Skin tear Thigh Posterior;Mid;Left Small skin tear to back of thigh. 01/28/22  1413  Thigh  282   Wound / Incision (Open or Dehisced) 01/28/22 Skin tear Thigh Right;Posterior;Mid Small skin tear to back of right thigh. 01/28/22  1410  Thigh  282            Intake/Output Last 24 hours No intake or output data in the 24 hours ending 11/06/22 2327  Labs/Imaging Results for orders placed or performed during the hospital encounter of 11/06/22 (from the past 48 hour(s))  Comprehensive metabolic panel     Status: Abnormal   Collection Time: 11/06/22  5:55 PM  Result Value Ref Range   Sodium 141 135 - 145 mmol/L   Potassium 3.5 3.5 - 5.1 mmol/L   Chloride 105 98 - 111 mmol/L   CO2 25 22 - 32 mmol/L   Glucose, Bld 144 (H) 70 - 99 mg/dL    Comment: Glucose reference range applies only to samples taken after fasting for at least 8 hours.   BUN 14 8 - 23 mg/dL   Creatinine, Ser 7.82 0.44 - 1.00 mg/dL   Calcium 8.4  (L) 8.9 - 10.3 mg/dL   Total Protein 6.2 (L) 6.5 - 8.1 g/dL   Albumin 2.9 (L) 3.5 - 5.0 g/dL   AST 13 (L) 15 - 41 U/L   ALT 14 0 - 44 U/L   Alkaline Phosphatase 84 38 - 126 U/L   Total Bilirubin 0.5 0.3 - 1.2 mg/dL   GFR, Estimated >95 >62 mL/min    Comment: (NOTE) Calculated using the CKD-EPI Creatinine Equation (2021)    Anion gap 11 5 - 15    Comment: Performed at [facility], [address], [place], [place] [address]  CBC     Status: Abnormal   Collection Time: 11/06/22  5:55 PM  Result Value Ref Range   WBC 10.8 (H) 4.0 - 10.5 K/uL   RBC 2.42 (L) 3.87 - 5.11 MIL/uL   Hemoglobin 6.5 (LL) 12.0 - 15.0 g/dL    Comment: REPEATED TO VERIFY THIS CRITICAL RESULT HAS VERIFIED AND BEEN CALLED TO PAIGE PULILAM, RN BY [employer] ON 08 15 2024 AT 1838, AND HAS BEEN READ BACK.     HCT 24.4 (L) 36.0 - 46.0 %   MCV 100.8 (H) 80.0 - 100.0 fL   MCH 26.9 26.0 - 34.0 pg   MCHC 26.6 (L) 30.0 - 36.0 g/dL   RDW 29.5 (H) 28.4 - 13.2 %   Platelets 540 (H) 150 - 400 K/uL   nRBC 0.0 0.0 - 0.2 %    Comment: Performed at [facility], [address], [place], [place] [address]  Type and screen [facility]     Status: None (Preliminary result)   Collection Time: 11/06/22  6:08 PM  Result Value Ref Range   ABO/RH(D) O POS    Antibody Screen NEG    Sample Expiration      11/09/2022,2359 Performed at [facility], [address], [place], [place] [address]    Unit Number [id number]    Blood Component Type RED CELLS,LR    Unit division 00    Status of Unit ISSUED    Transfusion Status OK TO TRANSFUSE    Crossmatch Result Compatible    Unit Number [id number]    Blood Component Type RBC LR PHER1    Unit division 00    Status of Unit ALLOCATED    Transfusion Status OK TO TRANSFUSE    Crossmatch Result Compatible   Prepare RBC (crossmatch)     Status: None   Collection Time: 11/06/22  8:24 PM  Result Value Ref Range   Order Confirmation       ORDER PROCESSED BY BLOOD BANK Performed at [facility], [address], [place], [place] [address]   Iron and TIBC     Status: Abnormal   Collection Time: 11/06/22  8:46 PM  Result Value Ref Range   Iron 17 (L) 28 - 170 ug/dL   TIBC 606 301 - 601 ug/dL   Saturation Ratios 5 (L) 10.4 - 31.8 %   UIBC 364 ug/dL    Comment: Performed at [facility], [address], [place], [place] [address]  Folate     Status: None   Collection Time: 11/06/22  8:46 PM  Result Value Ref Range   Folate 11.2 >5.9 ng/mL    Comment: Performed at [facility], [address], [place], [place] [address]  Vitamin B12     Status: None   Collection Time: 11/06/22  8:46 PM  Result Value Ref Range   Vitamin B-12 286 180 - 914 pg/mL    Comment: (NOTE) This assay is not validated for testing neonatal or myeloproliferative syndrome specimens for Vitamin B12 levels. Performed at [facility], [address], [place], [place] [address]   POC occult blood, ED     Status: None   Collection Time: 11/06/22  9:13 PM  Result Value Ref Range   Fecal Occult Bld NEGATIVE NEGATIVE  Prepare RBC (crossmatch)     Status: None   Collection Time: 11/06/22 11:08 PM  Result Value Ref Range   Order Confirmation      ORDER PROCESSED BY BLOOD BANK Performed at [facility], [address], [place], [place] [address]    VAS [place] LOWER EXTREMITY ARTERIAL DUPLEX  Result Date: 11/06/2022 LOWER EXTREMITY ARTERIAL DUPLEX STUDY Patient Name:  ATLEAN LAUERSDORF  Date of Exam:   11/06/2022 Medical Rec #: [id number]          Accession #:    [id number] Date of Birth: [birth year]           Patient Gender: F Patient Age:   82 years Exam Location:  [facility] Procedure:      VAS [place] LOWER EXTREMITY ARTERIAL DUPLEX Referring Phys: Cristal Deer DICKSON --------------------------------------------------------------------------------  Indications: Peripheral artery disease. High Risk Factors: Hypertension,  hyperlipidemia.  Vascular Interventions: 07/2019: Left SFA stent                         10/2020: Right SFA stent                         07/11/21: Left SFA angioplasty and stent; ATA ,TPT ,PTA                         angioplasty 5th ray amp 2021.                         01/14/2022: Left BKA. Current ABI:            R 0.34 L BKA Performing Technologist: Argentina Ponder RVS  Examination Guidelines: A complete evaluation includes B-mode imaging, spectral Doppler, color Doppler, and power Doppler as needed of all accessible portions of each vessel. Bilateral testing is considered an integral part of a complete examination. Limited examinations for reoccurring indications may be performed as noted.  +-----------+--------+-----+---------------+----------+--------+ RIGHT      PSV cm/sRatioStenosis       Waveform  Comments +-----------+--------+-----+---------------+----------+--------+ CFA Prox   233          50-74% stenosismonophasic         +-----------+--------+-----+---------------+----------+--------+ DFA        204          50-74% stenosismonophasic         +-----------+--------+-----+---------------+----------+--------+ SFA Prox   217          50-74% stenosismonophasic         +-----------+--------+-----+---------------+----------+--------+ SFA Mid    64                          monophasic         +-----------+--------+-----+---------------+----------+--------+ POP Distal 257          50-74% stenosismonophasic         +-----------+--------+-----+---------------+----------+--------+ ATA Distal 47                          monophasic         +-----------+--------+-----+---------------+----------+--------+ PTA Distal 54                          monophasic         +-----------+--------+-----+---------------+----------+--------+ PERO Distal39                          monophasic         +-----------+--------+-----+---------------+----------+--------+  Right  Stent(s): +---------------+--------+---------------+----------+--------+ SFA-POP        PSV cm/sStenosis  Waveform  Comments +---------------+--------+---------------+----------+--------+ Prox to Stent  51                     monophasic         +---------------+--------+---------------+----------+--------+ Proximal Stent 45                     monophasic         +---------------+--------+---------------+----------+--------+ Mid Stent      45                     monophasic         +---------------+--------+---------------+----------+--------+ Distal Stent   58                     monophasic         +---------------+--------+---------------+----------+--------+ Distal to [id number]     50-99% stenosismonophasic         +---------------+--------+---------------+----------+--------+    Summary: Right: 50-74% stenosis noted in the common femoral artery. 50-74% stenosis noted in the deep femoral artery. 50-74% stenosis noted in the superficial femoral artery. 50-74% stenosis noted in the popliteal artery. Patent mid superficial femoral artery to popliteal artery stent with velocities in the native outflow (popliteal) suggesting a 50-99% stenosis.   See table(s) above for measurements and observations. Electronically signed by Lemar Livings MD on 11/06/2022 at 12:00:15 PM.    Final    VAS [place] ABI WITH/WO TBI  Result Date: 11/05/2022  LOWER EXTREMITY DOPPLER STUDY Patient Name:  TZIRY DITORO  Date of Exam:   11/05/2022 Medical Rec #: [id number]          Accession #:    [id number] Date of Birth: 01-24-1951           Patient Gender: F Patient Age:   53 years Exam Location:  [facility] Procedure:      VAS [place] ABI WITH/WO TBI Referring Phys: Ovid Curd --------------------------------------------------------------------------------  Indications: Peripheral artery disease. High Risk         Hypertension, hyperlipidemia, Diabetes, past history of Factors:           smoking.  Vascular Interventions: 07/2019: Left SFA stent                         10/2020: Right SFA stent                         07/11/21: Left SFA angioplasty and stent; ATA ,TPT ,PTA                         angioplasty 5th ray amp 2021.                         01/14/2022: Left BKA. Comparison Study: 04/04/2022 ABI/TBI- Right=0.87/0.58, left=BKA Performing Technologist: Gertie Fey MHA, RVT, RDCS, RDMS  Examination Guidelines: A complete evaluation includes at minimum, Doppler waveform signals and systolic blood pressure reading at the level of bilateral brachial, anterior tibial, and posterior tibial arteries, when vessel segments are accessible. Bilateral testing is considered an integral part of a complete examination. Photoelectric Plethysmograph (PPG) waveforms and toe systolic pressure readings are included as required and additional duplex testing as needed. Limited examinations for reoccurring indications may be performed as noted.  ABI Findings: +---------+------------------+-----+----------+--------+ Right    Rt Pressure (mmHg)IndexWaveform  Comment  +---------+------------------+-----+----------+--------+ Brachial 162                                       +---------+------------------+-----+----------+--------+  PTA      47                0.29 monophasic         +---------+------------------+-----+----------+--------+ DP       55                0.34 monophasic         +---------+------------------+-----+----------+--------+ Great Toe                       Absent             +---------+------------------+-----+----------+--------+ +---------+------------------+-----+--------+-------+ Left     Lt Pressure (mmHg)IndexWaveformComment +---------+------------------+-----+--------+-------+ Brachial 145                                    +---------+------------------+-----+--------+-------+ PTA                                     BKA      +---------+------------------+-----+--------+-------+ DP                                      BKA     +---------+------------------+-----+--------+-------+ Great Toe                               BKA     +---------+------------------+-----+--------+-------+ +-------+-----------+-----------+------------+------------+ ABI/TBIToday's ABIToday's TBIPrevious ABIPrevious TBI +-------+-----------+-----------+------------+------------+ Right  0.34       0.00       0.87        0.58         +-------+-----------+-----------+------------+------------+ Left   BKA        BKA        BKA         BKA          +-------+-----------+-----------+------------+------------+  Right ABIs and TBIs appear decreased compared to prior study on 04/04/22.  Summary: Right: Resting right ankle-brachial index indicates severe, borderline critical right lower extremity arterial disease. The right toe-brachial index is abnormal. *See table(s) above for measurements and observations.  Electronically signed by Lemar Livings MD on 11/05/2022 at 1:36:30 PM.    Final     Pending Labs Unresulted Labs (From admission, onward)     Start     Ordered   Signed and Held  Comprehensive metabolic panel  Once-Timed,   R        Signed and Held   Signed and Held  Magnesium  Once-Timed,   R        Signed and Held   Signed and Held  CBC with Differential/Platelet  Once-Timed,   R        Signed and Held   Signed and Held  Hemoglobin A1c  Once,   R       Comments: To assess prior glycemic control    Signed and Held   Signed and Held  HIV Antibody (routine testing w rflx)  (HIV Antibody (Routine testing w reflex) panel)  Add-on,   R        Signed and Held            Vitals/Pain Today's Vitals   11/06/22 2115 11/06/22 2116 11/06/22 2130 11/06/22 2320  BP: [name])  149/70 (!) 149/70  (!) 134/48  Pulse: 78 76 81 89  Resp:  16    Temp:  99.2 F (37.3 C)    TempSrc:  Oral    SpO2: 99%  100% 99%  Weight:      Height:       PainSc:        Isolation Precautions No active isolations  Medications Medications  apixaban (ELIQUIS) tablet 5 mg (has no administration in time range)  atorvastatin (LIPITOR) tablet 40 mg (has no administration in time range)  ferrous sulfate tablet 325 mg (has no administration in time range)  furosemide (LASIX) injection 40 mg (has no administration in time range)  0.9 %  sodium chloride infusion (Manually program via Guardrails IV Fluids) (has no administration in time range)  potassium chloride SA (KLOR-CON M) CR tablet 40 mEq (has no administration in time range)  insulin glargine-yfgn (SEMGLEE) injection 18 Units (has no administration in time range)  0.9 %  sodium chloride infusion (Manually program via Guardrails IV Fluids) ( Intravenous New Bag/Given 11/06/22 2108)    Mobility walks with device     Focused Assessments Cardiac Assessment Handoff:    No results found for: "CKTOTAL", "CKMB", "CKMBINDEX", "TROPONINI" No results found for: "DDIMER" Does the Patient currently have chest pain? No    R Recommendations: See Admitting Provider Note  Report given to:   Additional Notes:  DC tomorrow after receiving blood products

## 2022-11-06 NOTE — ED Triage Notes (Addendum)
Pt reports she was sent here by her PCP for low hemoglobin, states it was " 7 point something." She denies blood in stool or urine but she does report feeling lightheaded. She takes Eliquis and Plavix.  She originally went to see her PCP for right leg swelling.

## 2022-11-06 NOTE — Assessment & Plan Note (Signed)
Stable. Continue with Eliquis 

## 2022-11-06 NOTE — Assessment & Plan Note (Signed)
Observation MedSurg bed.  Transfuse with 2 units of blood.  Will give 40 mg of IV Lasix in between units of blood.  Repeat CBC in the morning.  Patient should be ready to be discharged after blood transfusion.

## 2022-11-06 NOTE — Telephone Encounter (Signed)
Tammy Good, from [facility], called to follow up with you.  This patient had critical ABI findings and you wanted her to get back in asap and be seen by the surgeon.  She wanted you to know they did reach out to her and will get her seen same day for the duplex ultrasound of the right leg to evaluate the stent and she'll also be seen by the provider.

## 2022-11-06 NOTE — Subjective & Objective (Signed)
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  Patient with been seen in follow-up.  She was noted to have worsening ABIs on her right leg.  They had labs drawn.  She noted to have worsening anemia.  Hemoglobin is down to 7.0.  Baseline is around 8.9.  Patient called to go to the ER.  On arrival, hemoglobin repeated and was down to 6.5.  Iron studies showed an iron of 17, TIBC of 381, percent saturation of 5%.  Patient denies being a Jehovah's Witness.  Transfusion ordered.  [facility] hospitalist consulted.

## 2022-11-07 DIAGNOSIS — D649 Anemia, unspecified: Secondary | ICD-10-CM | POA: Diagnosis not present

## 2022-11-07 DIAGNOSIS — D509 Iron deficiency anemia, unspecified: Secondary | ICD-10-CM | POA: Diagnosis not present

## 2022-11-07 LAB — COMPREHENSIVE METABOLIC PANEL
ALT: 12 U/L (ref 0–44)
AST: 12 U/L — ABNORMAL LOW (ref 15–41)
Albumin: 2.9 g/dL — ABNORMAL LOW (ref 3.5–5.0)
Alkaline Phosphatase: 76 U/L (ref 38–126)
Anion gap: 8 (ref 5–15)
BUN: 10 mg/dL (ref 8–23)
CO2: 28 mmol/L (ref 22–32)
Calcium: 8.2 mg/dL — ABNORMAL LOW (ref 8.9–10.3)
Chloride: 104 mmol/L (ref 98–111)
Creatinine, Ser: 0.71 mg/dL (ref 0.44–1.00)
GFR, Estimated: >60 mL/min (ref >60)
Glucose, Bld: 96 mg/dL (ref 70–99)
Potassium: 3.7 mmol/L (ref 3.5–5.1)
Sodium: 140 mmol/L (ref 135–145)
Total Bilirubin: 1.1 mg/dL (ref 0.3–1.2)
Total Protein: 6.4 g/dL — ABNORMAL LOW (ref 6.5–8.1)

## 2022-11-07 LAB — CBC WITH DIFFERENTIAL/PLATELET
Abs Immature Granulocytes: 0.03 10*3/uL (ref 0.00–0.07)
Basophils Absolute: 0.1 10*3/uL (ref 0.0–0.1)
Basophils Relative: 1 %
Eosinophils Absolute: 0.1 10*3/uL (ref 0.0–0.5)
Eosinophils Relative: 1 %
HCT: 31.7 % — ABNORMAL LOW (ref 36.0–46.0)
Hemoglobin: 9.3 g/dL — ABNORMAL LOW (ref 12.0–15.0)
Immature Granulocytes: 0 %
Lymphocytes Relative: 9 %
Lymphs Abs: 0.9 10*3/uL (ref 0.7–4.0)
MCH: 27.4 pg (ref 26.0–34.0)
MCHC: 29.3 g/dL — ABNORMAL LOW (ref 30.0–36.0)
MCV: 93.5 fL (ref 80.0–100.0)
Monocytes Absolute: 0.9 10*3/uL (ref 0.1–1.0)
Monocytes Relative: 9 %
Neutro Abs: 8.1 10*3/uL — ABNORMAL HIGH (ref 1.7–7.7)
Neutrophils Relative %: 80 %
Platelets: 499 10*3/uL — ABNORMAL HIGH (ref 150–400)
RBC: 3.39 MIL/uL — ABNORMAL LOW (ref 3.87–5.11)
RDW: 17.6 % — ABNORMAL HIGH (ref 11.5–15.5)
WBC: 10.1 10*3/uL (ref 4.0–10.5)
nRBC: 0 % (ref 0.0–0.2)

## 2022-11-07 LAB — HEMOGLOBIN A1C
Hgb A1c MFr Bld: 4.6 % — ABNORMAL LOW (ref 4.8–5.6)
Mean Plasma Glucose: 85.32 mg/dL

## 2022-11-07 LAB — GLUCOSE, CAPILLARY: Glucose-Capillary: 77 mg/dL (ref 70–99)

## 2022-11-07 LAB — MAGNESIUM: Magnesium: 2.2 mg/dL (ref 1.7–2.4)

## 2022-11-07 LAB — HIV ANTIBODY (ROUTINE TESTING W REFLEX): HIV Screen 4th Generation wRfx: NONREACTIVE

## 2022-11-07 MED ORDER — MIDODRINE HCL 5 MG PO TABS
5.0000 mg | ORAL_TABLET | Freq: Two times a day (BID) | ORAL | Status: DC
  Administered 2022-11-07: 5 mg via ORAL
  Filled 2022-11-07: qty 1

## 2022-11-07 MED ORDER — DAPAGLIFLOZIN PROPANEDIOL 10 MG PO TABS
10.0000 mg | ORAL_TABLET | Freq: Every day | ORAL | Status: DC
  Administered 2022-11-07: 10 mg via ORAL
  Filled 2022-11-07: qty 1

## 2022-11-07 MED ORDER — CLOTRIMAZOLE 1 % VA CREA
1.0000 | TOPICAL_CREAM | Freq: Every day | VAGINAL | Status: DC
  Administered 2022-11-07: 1 via VAGINAL
  Filled 2022-11-07: qty 45

## 2022-11-07 MED ORDER — CLOPIDOGREL BISULFATE 75 MG PO TABS: 75.0000 mg | ORAL_TABLET | Freq: Every day | ORAL | Status: DC

## 2022-11-07 MED ORDER — ONDANSETRON HCL 4 MG PO TABS: 4.0000 mg | ORAL_TABLET | Freq: Four times a day (QID) | ORAL | Status: DC | PRN

## 2022-11-07 MED ORDER — MELATONIN 5 MG PO TABS: 10.0000 mg | ORAL_TABLET | Freq: Every evening | ORAL | Status: DC | PRN

## 2022-11-07 MED ORDER — INSULIN ASPART 100 UNIT/ML IJ SOLN: 0.0000 [IU] | Freq: Every day | INTRAMUSCULAR | Status: DC

## 2022-11-07 MED ORDER — LEVOTHYROXINE SODIUM 25 MCG PO TABS
125.0000 ug | ORAL_TABLET | Freq: Every day | ORAL | Status: DC
  Administered 2022-11-07: 125 ug via ORAL
  Filled 2022-11-07: qty 1

## 2022-11-07 MED ORDER — POLYETHYLENE GLYCOL 3350 17 G PO PACK: 17.0000 g | PACK | Freq: Every day | ORAL | Status: DC | PRN

## 2022-11-07 MED ORDER — INSULIN ASPART 100 UNIT/ML IJ SOLN: 0.0000 [IU] | Freq: Three times a day (TID) | INTRAMUSCULAR | Status: DC

## 2022-11-07 MED ORDER — ACETAMINOPHEN 650 MG RE SUPP: 650.0000 mg | Freq: Four times a day (QID) | RECTAL | Status: DC | PRN

## 2022-11-07 MED ORDER — DEXAMETHASONE 0.5 MG PO TABS
0.5000 mg | ORAL_TABLET | Freq: Every day | ORAL | Status: DC
  Administered 2022-11-07: 0.5 mg via ORAL
  Filled 2022-11-07: qty 1

## 2022-11-07 MED ORDER — ACETAMINOPHEN 325 MG PO TABS: 650.0000 mg | ORAL_TABLET | Freq: Four times a day (QID) | ORAL | Status: DC | PRN

## 2022-11-07 MED ORDER — PANTOPRAZOLE SODIUM 40 MG PO TBEC
40.0000 mg | DELAYED_RELEASE_TABLET | Freq: Every day | ORAL | Status: DC
  Administered 2022-11-07: 40 mg via ORAL
  Filled 2022-11-07: qty 1

## 2022-11-07 MED ORDER — ONDANSETRON HCL 4 MG/2ML IJ SOLN: 4.0000 mg | Freq: Four times a day (QID) | INTRAMUSCULAR | Status: DC | PRN

## 2022-11-07 NOTE — Plan of Care (Signed)

## 2022-11-07 NOTE — Progress Notes (Signed)
11/07/2022  0030  Received pt to room 6N-09 from ED with symptomatic anemia.  Pt has first unit of blood transfusing and it is almost finished.  Oriented to room call light and bed. Call bell on reach Kathryne Hitch

## 2022-11-07 NOTE — ED Notes (Signed)
Patient was transported to floor by nurse in a stable condition and made comfortable in  room

## 2022-11-07 NOTE — Discharge Summary (Signed)
Physician Discharge Summary   Patient: Tammy Good MRN: [id number] DOB: Jun 17, 1950  Admit date:     11/06/2022  Discharge date: 11/07/22  Discharge Physician: Bobette Mo   PCP: Adrian Prince, MD   Recommendations at discharge:  Follow-up with primary care at her earliest convenience.  Hospital Course: Per Dr. 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  Patient with been seen in follow-up.  She was noted to have worsening ABIs on her right leg.  They had labs drawn.  She noted to have worsening anemia.  Hemoglobin is down to 7.0.  Baseline is around 8.9.  Patient called to go to the ER.   On arrival, hemoglobin repeated and was down to 6.5.   Iron studies showed an iron of 17, TIBC of 381, percent saturation of 5%.   Patient denies being a Jehovah's Witness.   Transfusion ordered.   [facility] hospitalist consulted.    ED Course: HgB 6.5, hemoccult negative  Assessment and Plan:   Symptomatic anemia Observation MedSurg bed.   Transfused 2 units of blood.   Repeat CBC in the morning.   Feeling a lot better today. Discharged after blood transfusion.  Iron deficiency anemia Patient with known iron deficiency anemia.  Continue with oral iron.  Atrial fibrillation (HCC) Stable.  Continue with Eliquis.  Chronic systolic CHF (congestive heart failure) (HCC) Stable.  Will give IV Lasix in between units of blood.  PAD (peripheral artery disease) (HCC) Patient on Plavix and aspirin.  Had recent decrease in her ABI on the right.  Vascular surgery planning on angiogram in the next 1 to 2  weeks.  Patient has not been off of Eliquis for 3 days required to perform angiogram.  Obesity (BMI 30-39.9) Chronic.  BMI of 33.  Essential hypertension Stable.  Diabetes mellitus, type 2 (HCC) Continue with Basaglar.  Add sliding scale insulin.  Panhypopituitarism (HCC) Continue with Decadron.  Chronic.  Consultants: None. Procedures performed: None. Disposition: Home Diet recommendation:  Cardiac and Carb modified diet DISCHARGE MEDICATION: Allergies as of 11/07/2022       Reactions   Strawberry Extract Itching, Swelling, Anaphylaxis   Mouth swells and gets itchy        Medication List     TAKE these medications    apixaban 5 MG Tabs tablet Commonly known as: Eliquis Take 1 tablet (5 mg total) by mouth 2 (two) times daily.   atorvastatin 40 MG tablet Commonly known as: LIPITOR Take 1 tablet (40 mg total) by mouth daily at 6 PM. What changed: when to take this   B-D UF III MINI PEN NEEDLES 31G X 5 MM Misc Generic drug: Insulin Pen Needle Inject into the skin.   Basaglar KwikPen 100 UNIT/ML Inject 24 Units into the skin at bedtime. What changed: how much to take   clopidogrel 75 MG tablet Commonly known as: PLAVIX Take 1 tablet (75 mg total) by mouth daily.   cycloSPORINE 0.05 % ophthalmic emulsion Commonly known as: RESTASIS 1 drop 2 (two) times daily.   dapagliflozin propanediol 10 MG Tabs tablet Commonly known as: FARXIGA Take 10 mg by mouth daily.   dexamethasone 0.5 MG tablet Commonly known as: Decadron Take  1 tablet (0.5 mg total) by mouth daily.   diclofenac Sodium 1 % Gel Commonly known as: VOLTAREN Apply 4 g topically 4 (four) times daily.   ferrous sulfate 325 (65 FE) MG tablet Take 325 mg by mouth at bedtime.   FreeStyle Libre 2 Sensor Misc Apply topically every 14 (fourteen) days.   furosemide 20 MG tablet Commonly known as: Lasix Take 1 tablet (20 mg total) by mouth daily as needed for fluid or edema.   levothyroxine 125  MCG tablet Commonly known as: SYNTHROID Take 1 tablet (125 mcg total) by mouth daily before breakfast.   midodrine 5 MG tablet Commonly known as: PROAMATINE Take 1 tablet (5 mg total) by mouth 2 (two) times daily with breakfast and lunch.   mirabegron ER 50 MG Tb24 tablet Commonly known as: MYRBETRIQ Take 50 mg by mouth daily.   mupirocin ointment 2 % Commonly known as: BACTROBAN Apply 1 Application topically 2 (two) times daily.   nitroGLYCERIN 0.4 MG SL tablet Commonly known as: NITROSTAT Place 0.4 mg under the tongue every 5 (five) minutes as needed for chest pain.   NovoLOG FlexPen 100 UNIT/ML FlexPen Generic drug: insulin aspart Inject 0-7 Units into the skin 3 (three) times daily with meals. Sliding scale insulin   pantoprazole 40 MG tablet Commonly known as: PROTONIX Take 40 mg by mouth daily.   polyethylene glycol powder 17 GM/SCOOP powder Commonly known as: GLYCOLAX/MIRALAX Take 1 capful with water (17 g) by mouth daily. What changed:  when to take this reasons to take this   Systane Complete 0.6 % Soln Generic drug: Propylene Glycol Place 1 drop into both eyes 2 (two) times daily as needed (dry/irritated eyes.).   trimethoprim 100 MG tablet Commonly known as: TRIMPEX Take 100 mg by mouth daily.        Discharge Exam: Filed Weights   11/06/22 1752 11/07/22 0103  Weight: 95.7 kg 95 kg   Physical Exam Vitals and nursing note reviewed.  Constitutional:      General: She is awake. She is not in acute distress.    Appearance: She is obese.  HENT:     Head: Normocephalic.     Nose: No rhinorrhea.     Mouth/Throat:     Mouth: Mucous membranes are moist.     Pharynx: No posterior oropharyngeal erythema.  Eyes:     General: No scleral icterus.    Pupils: Pupils are equal, round, and reactive to light.  Cardiovascular:     Rate and Rhythm: Normal rate and regular rhythm.  Pulmonary:     Effort: Pulmonary effort is normal.     Breath sounds: Normal  breath sounds.  Abdominal:     General: Bowel sounds are normal. There is no distension.     Palpations: Abdomen is soft.     Tenderness: There is no abdominal tenderness. There is no guarding.  Musculoskeletal:     Cervical back: Neck supple.     Right lower leg: No edema.     Left lower leg: No edema.  Skin:    General: Skin is warm and dry.  Neurological:     General: No focal deficit present.     Mental Status: She is alert and oriented to person, place, and time.  Psychiatric:        Mood and Affect: Mood normal.        Behavior: Behavior normal. Behavior is cooperative.    Condition at discharge: good  The results of significant  diagnostics from this hospitalization (including imaging, microbiology, ancillary and laboratory) are listed below for reference.   Imaging Studies: DG Foot Complete Right  Result Date: 11/07/2022 Please see detailed radiograph report in office note.  DG Ankle 2 Views Right  Result Date: 11/07/2022 Please see detailed radiograph report in office note.  VAS [place] LOWER EXTREMITY ARTERIAL DUPLEX  Result Date: 11/06/2022 LOWER EXTREMITY ARTERIAL DUPLEX STUDY Patient Name:  ZENDA DUNAGAN  Date of Exam:   11/06/2022 Medical Rec #: [id number]          Accession #:    [id number] Date of Birth: 1951-02-03           Patient Gender: F Patient Age:   61 years Exam Location:  [facility] Procedure:      VAS [place] LOWER EXTREMITY ARTERIAL DUPLEX Referring Phys: Cristal Deer DICKSON --------------------------------------------------------------------------------  Indications: Peripheral artery disease. High Risk Factors: Hypertension, hyperlipidemia.  Vascular Interventions: 07/2019: Left SFA stent                         10/2020: Right SFA stent                         07/11/21: Left SFA angioplasty and stent; ATA ,TPT ,PTA                         angioplasty 5th ray amp 2021.                         01/14/2022: Left BKA. Current ABI:            R 0.34 L  BKA Performing Technologist: Argentina Ponder RVS  Examination Guidelines: A complete evaluation includes B-mode imaging, spectral Doppler, color Doppler, and power Doppler as needed of all accessible portions of each vessel. Bilateral testing is considered an integral part of a complete examination. Limited examinations for reoccurring indications may be performed as noted.  +-----------+--------+-----+---------------+----------+--------+ RIGHT      PSV cm/sRatioStenosis       Waveform  Comments +-----------+--------+-----+---------------+----------+--------+ CFA Prox   233          50-74% stenosismonophasic         +-----------+--------+-----+---------------+----------+--------+ DFA        204          50-74% stenosismonophasic         +-----------+--------+-----+---------------+----------+--------+ SFA Prox   217          50-74% stenosismonophasic         +-----------+--------+-----+---------------+----------+--------+ SFA Mid    64                          monophasic         +-----------+--------+-----+---------------+----------+--------+ POP Distal 257          50-74% stenosismonophasic         +-----------+--------+-----+---------------+----------+--------+ ATA Distal 47                          monophasic         +-----------+--------+-----+---------------+----------+--------+ PTA Distal 54                          monophasic         +-----------+--------+-----+---------------+----------+--------+ PERO Distal39  monophasic         +-----------+--------+-----+---------------+----------+--------+  Right Stent(s): +---------------+--------+---------------+----------+--------+ SFA-POP        PSV cm/sStenosis       Waveform  Comments +---------------+--------+---------------+----------+--------+ Prox to Stent  51                     monophasic         +---------------+--------+---------------+----------+--------+  Proximal Stent 45                     monophasic         +---------------+--------+---------------+----------+--------+ Mid Stent      45                     monophasic         +---------------+--------+---------------+----------+--------+ Distal Stent   58                     monophasic         +---------------+--------+---------------+----------+--------+ Distal to [id number]     50-99% stenosismonophasic         +---------------+--------+---------------+----------+--------+    Summary: Right: 50-74% stenosis noted in the common femoral artery. 50-74% stenosis noted in the deep femoral artery. 50-74% stenosis noted in the superficial femoral artery. 50-74% stenosis noted in the popliteal artery. Patent mid superficial femoral artery to popliteal artery stent with velocities in the native outflow (popliteal) suggesting a 50-99% stenosis.   See table(s) above for measurements and observations. Electronically signed by Lemar Livings MD on 11/06/2022 at 12:00:15 PM.    Final    VAS [place] ABI WITH/WO TBI  Result Date: 11/05/2022  LOWER EXTREMITY DOPPLER STUDY Patient Name:  AYTANA LEBOEUF  Date of Exam:   11/05/2022 Medical Rec #: [id number]          Accession #:    [id number] Date of Birth: [birth year]           Patient Gender: F Patient Age:   35 years Exam Location:  [facility] Procedure:      VAS [place] ABI WITH/WO TBI Referring Phys: Ovid Curd --------------------------------------------------------------------------------  Indications: Peripheral artery disease. High Risk         Hypertension, hyperlipidemia, Diabetes, past history of Factors:          smoking.  Vascular Interventions: 07/2019: Left SFA stent                         10/2020: Right SFA stent                         07/11/21: Left SFA angioplasty and stent; ATA ,TPT ,PTA                         angioplasty 5th ray amp 2021.                         01/14/2022: Left BKA. Comparison Study: 04/04/2022 ABI/TBI-  Right=0.87/0.58, left=BKA Performing Technologist: Gertie Fey MHA, RVT, RDCS, RDMS  Examination Guidelines: A complete evaluation includes at minimum, Doppler waveform signals and systolic blood pressure reading at the level of bilateral brachial, anterior tibial, and posterior tibial arteries, when vessel segments are accessible. Bilateral testing is considered an integral part of a complete examination. Photoelectric Plethysmograph (PPG) waveforms and toe systolic pressure readings are included as required and additional  duplex testing as needed. Limited examinations for reoccurring indications may be performed as noted.  ABI Findings: +---------+------------------+-----+----------+--------+ Right    Rt Pressure (mmHg)IndexWaveform  Comment  +---------+------------------+-----+----------+--------+ Brachial 162                                       +---------+------------------+-----+----------+--------+ PTA      47                0.29 monophasic         +---------+------------------+-----+----------+--------+ DP       55                0.34 monophasic         +---------+------------------+-----+----------+--------+ Great Toe                       Absent             +---------+------------------+-----+----------+--------+ +---------+------------------+-----+--------+-------+ Left     Lt Pressure (mmHg)IndexWaveformComment +---------+------------------+-----+--------+-------+ Brachial 145                                    +---------+------------------+-----+--------+-------+ PTA                                     BKA     +---------+------------------+-----+--------+-------+ DP                                      BKA     +---------+------------------+-----+--------+-------+ Great Toe                               BKA     +---------+------------------+-----+--------+-------+ +-------+-----------+-----------+------------+------------+  ABI/TBIToday's ABIToday's TBIPrevious ABIPrevious TBI +-------+-----------+-----------+------------+------------+ Right  0.34       0.00       0.87        0.58         +-------+-----------+-----------+------------+------------+ Left   BKA        BKA        BKA         BKA          +-------+-----------+-----------+------------+------------+  Right ABIs and TBIs appear decreased compared to prior study on 04/04/22.  Summary: Right: Resting right ankle-brachial index indicates severe, borderline critical right lower extremity arterial disease. The right toe-brachial index is abnormal. *See table(s) above for measurements and observations.  Electronically signed by Lemar Livings MD on 11/05/2022 at 1:36:30 PM.    Final     Microbiology: Results for orders placed or performed during the hospital encounter of 01/17/22  Urine Culture     Status: Abnormal   Collection Time: 01/28/22 10:03 AM   Specimen: Urine, Clean Catch  Result Value Ref Range Status   Specimen Description URINE, CLEAN CATCH  Final   Special Requests   Final    NONE Performed at [facility], [address], [place], [place] [address]    Culture (A)  Final    40,000 COLONIES/mL ESCHERICHIA COLI 10,000 COLONIES/mL KLEBSIELLA PNEUMONIAE    Report Status 01/30/2022 FINAL  Final   Organism ID, Bacteria ESCHERICHIA COLI (A)  Final   Organism ID, Bacteria KLEBSIELLA PNEUMONIAE (A)  Final      Susceptibility   Escherichia coli - MIC*    AMPICILLIN <=2 SENSITIVE Sensitive     CEFAZOLIN <=4 SENSITIVE Sensitive     CEFEPIME <=0.12 SENSITIVE Sensitive     CEFTRIAXONE <=0.25 SENSITIVE Sensitive     CIPROFLOXACIN <=0.25 SENSITIVE Sensitive     GENTAMICIN <=1 SENSITIVE Sensitive     IMIPENEM <=0.25 SENSITIVE Sensitive     NITROFURANTOIN <=16 SENSITIVE Sensitive     TRIMETH/SULFA <=20 SENSITIVE Sensitive     AMPICILLIN/SULBACTAM <=2 SENSITIVE Sensitive     PIP/TAZO <=4 SENSITIVE Sensitive     * 40,000 COLONIES/mL  ESCHERICHIA COLI   Klebsiella pneumoniae - MIC*    AMPICILLIN >=32 RESISTANT Resistant     CEFAZOLIN <=4 SENSITIVE Sensitive     CEFEPIME <=0.12 SENSITIVE Sensitive     CEFTRIAXONE <=0.25 SENSITIVE Sensitive     CIPROFLOXACIN <=0.25 SENSITIVE Sensitive     GENTAMICIN <=1 SENSITIVE Sensitive     IMIPENEM <=0.25 SENSITIVE Sensitive     NITROFURANTOIN 64 INTERMEDIATE Intermediate     TRIMETH/SULFA <=20 SENSITIVE Sensitive     AMPICILLIN/SULBACTAM 4 SENSITIVE Sensitive     PIP/TAZO <=4 SENSITIVE Sensitive     * 10,000 COLONIES/mL KLEBSIELLA PNEUMONIAE    Labs: CBC: Recent Labs  Lab 11/05/22 1209 11/06/22 1755 11/07/22 0808  WBC 9.3 10.8* 10.1  NEUTROABS 7.2*  --  8.1*  HGB 7.0* 6.5* 9.3*  HCT 24.5* 24.4* 31.7*  MCV 96 100.8* 93.5  PLT 570* 540* 499*   Basic Metabolic Panel: Recent Labs  Lab 11/06/22 1755 11/07/22 0808  NA 141 140  K 3.5 3.7  CL 105 104  CO2 25 28  GLUCOSE 144* 96  BUN 14 10  CREATININE 0.89 0.71  CALCIUM 8.4* 8.2*  MG  --  2.2   Liver Function Tests: Recent Labs  Lab 11/06/22 1755 11/07/22 0808  AST 13* 12*  ALT 14 12  ALKPHOS 84 76  BILITOT 0.5 1.1  PROT 6.2* 6.4*  ALBUMIN 2.9* 2.9*   CBG: Recent Labs  Lab 11/06/22 2341 11/07/22 0801  GLUCAP 176* 77    Discharge time spent: less than 30 minutes.  Signed: Bobette Mo, MD [facility] 11/07/2022  This document was prepared using Dragon voice recognition software and may contain some unintended transcription errors.

## 2022-11-08 LAB — TYPE AND SCREEN
ABO/RH(D): O POS
Antibody Screen: NEGATIVE
Unit division: 0
Unit division: 0

## 2022-11-08 LAB — BPAM RBC
Blood Product Expiration Date: 202409102359
Blood Product Expiration Date: 202409102359
ISSUE DATE / TIME: 202408152052
ISSUE DATE / TIME: 202408160135
Unit Type and Rh: 5100
Unit Type and Rh: 5100

## 2022-11-11 ENCOUNTER — Other Ambulatory Visit: Payer: Self-pay

## 2022-11-11 DIAGNOSIS — I739 Peripheral vascular disease, unspecified: Secondary | ICD-10-CM

## 2022-11-11 DIAGNOSIS — T82856A Stenosis of peripheral vascular stent, initial encounter: Secondary | ICD-10-CM

## 2022-11-20 ENCOUNTER — Ambulatory Visit (HOSPITAL_COMMUNITY)
Admission: RE | Admit: 2022-11-20 | Discharge: 2022-11-20 | Disposition: A | Discharge status: Home / Self Care | Payer: Medicare Other | Source: Ambulatory Visit | Attending: Vascular Surgery | Admitting: Vascular Surgery

## 2022-11-20 ENCOUNTER — Other Ambulatory Visit: Payer: Self-pay

## 2022-11-20 ENCOUNTER — Encounter (HOSPITAL_COMMUNITY)
Admission: RE | Disposition: A | Discharge status: Home / Self Care | Payer: Self-pay | Source: Ambulatory Visit | Attending: Vascular Surgery

## 2022-11-20 ENCOUNTER — Telehealth: Payer: Self-pay | Admitting: Vascular Surgery

## 2022-11-20 ENCOUNTER — Encounter (HOSPITAL_BASED_OUTPATIENT_CLINIC_OR_DEPARTMENT_OTHER): Payer: Self-pay

## 2022-11-20 DIAGNOSIS — I509 Heart failure, unspecified: Secondary | ICD-10-CM | POA: Insufficient documentation

## 2022-11-20 DIAGNOSIS — I110 Hypertensive heart disease with heart failure: Secondary | ICD-10-CM | POA: Insufficient documentation

## 2022-11-20 DIAGNOSIS — I70201 Unspecified atherosclerosis of native arteries of extremities, right leg: Secondary | ICD-10-CM | POA: Insufficient documentation

## 2022-11-20 DIAGNOSIS — Z79899 Other long term (current) drug therapy: Secondary | ICD-10-CM | POA: Insufficient documentation

## 2022-11-20 DIAGNOSIS — T82858A Stenosis of vascular prosthetic devices, implants and grafts, initial encounter: Secondary | ICD-10-CM | POA: Diagnosis not present

## 2022-11-20 DIAGNOSIS — Z955 Presence of coronary angioplasty implant and graft: Secondary | ICD-10-CM | POA: Insufficient documentation

## 2022-11-20 DIAGNOSIS — E119 Type 2 diabetes mellitus without complications: Secondary | ICD-10-CM | POA: Insufficient documentation

## 2022-11-20 DIAGNOSIS — Z794 Long term (current) use of insulin: Secondary | ICD-10-CM | POA: Insufficient documentation

## 2022-11-20 DIAGNOSIS — K5900 Constipation, unspecified: Secondary | ICD-10-CM | POA: Insufficient documentation

## 2022-11-20 DIAGNOSIS — Z95820 Peripheral vascular angioplasty status with implants and grafts: Secondary | ICD-10-CM | POA: Diagnosis not present

## 2022-11-20 DIAGNOSIS — Y831 Surgical operation with implant of artificial internal device as the cause of abnormal reaction of the patient, or of later complication, without mention of misadventure at the time of the procedure: Secondary | ICD-10-CM | POA: Insufficient documentation

## 2022-11-20 DIAGNOSIS — I2510 Atherosclerotic heart disease of native coronary artery without angina pectoris: Secondary | ICD-10-CM | POA: Insufficient documentation

## 2022-11-20 DIAGNOSIS — Z7901 Long term (current) use of anticoagulants: Secondary | ICD-10-CM | POA: Insufficient documentation

## 2022-11-20 DIAGNOSIS — Z89512 Acquired absence of left leg below knee: Secondary | ICD-10-CM | POA: Insufficient documentation

## 2022-11-20 DIAGNOSIS — T82856A Stenosis of peripheral vascular stent, initial encounter: Secondary | ICD-10-CM

## 2022-11-20 DIAGNOSIS — I739 Peripheral vascular disease, unspecified: Secondary | ICD-10-CM

## 2022-11-20 HISTORY — PX: ABDOMINAL AORTOGRAM W/LOWER EXTREMITY: CATH118223

## 2022-11-20 HISTORY — PX: PERIPHERAL INTRAVASCULAR LITHOTRIPSY: CATH118324

## 2022-11-20 HISTORY — PX: PERIPHERAL VASCULAR BALLOON ANGIOPLASTY: CATH118281

## 2022-11-20 LAB — POCT I-STAT, CHEM 8
BUN: 21 mg/dL (ref 8–23)
Calcium, Ion: 1.16 mmol/L (ref 1.15–1.40)
Chloride: 104 mmol/L (ref 98–111)
Creatinine, Ser: 0.8 mg/dL (ref 0.44–1.00)
Glucose, Bld: 139 mg/dL — ABNORMAL HIGH (ref 70–99)
HCT: 31 % — ABNORMAL LOW (ref 36.0–46.0)
Hemoglobin: 10.5 g/dL — ABNORMAL LOW (ref 12.0–15.0)
Potassium: 4 mmol/L (ref 3.5–5.1)
Sodium: 141 mmol/L (ref 135–145)
TCO2: 26 mmol/L (ref 22–32)

## 2022-11-20 LAB — GLUCOSE, CAPILLARY: Glucose-Capillary: 86 mg/dL (ref 70–99)

## 2022-11-20 SURGERY — ABDOMINAL AORTOGRAM W/LOWER EXTREMITY
Anesthesia: LOCAL | Laterality: Right

## 2022-11-20 MED ORDER — FENTANYL CITRATE (PF) 100 MCG/2ML IJ SOLN
INTRAMUSCULAR | Status: DC | PRN
  Administered 2022-11-20: 25 ug via INTRAVENOUS

## 2022-11-20 MED ORDER — CLOPIDOGREL BISULFATE 300 MG PO TABS
ORAL_TABLET | ORAL | Status: DC | PRN
  Administered 2022-11-20: 300 mg via ORAL

## 2022-11-20 MED ORDER — LIDOCAINE HCL (PF) 1 % IJ SOLN
INTRAMUSCULAR | Status: DC | PRN
  Administered 2022-11-20: 15 mL

## 2022-11-20 MED ORDER — SODIUM CHLORIDE 0.9 % IV SOLN: INTRAVENOUS | Status: DC

## 2022-11-20 MED ORDER — HEPARIN SODIUM (PORCINE) 1000 UNIT/ML IJ SOLN
INTRAMUSCULAR | Status: DC | PRN
  Administered 2022-11-20: 9000 [IU] via INTRAVENOUS

## 2022-11-20 MED ORDER — LABETALOL HCL 5 MG/ML IV SOLN: 10.0000 mg | INTRAVENOUS | Status: DC | PRN

## 2022-11-20 MED ORDER — HEPARIN SODIUM (PORCINE) 1000 UNIT/ML IJ SOLN
INTRAMUSCULAR | Status: AC
  Filled 2022-11-20: qty 10

## 2022-11-20 MED ORDER — ONDANSETRON HCL 4 MG/2ML IJ SOLN: 4.0000 mg | Freq: Four times a day (QID) | INTRAMUSCULAR | Status: DC | PRN

## 2022-11-20 MED ORDER — ACETAMINOPHEN 325 MG PO TABS: 650.0000 mg | ORAL_TABLET | ORAL | Status: DC | PRN

## 2022-11-20 MED ORDER — MIDAZOLAM HCL 2 MG/2ML IJ SOLN
INTRAMUSCULAR | Status: AC
  Filled 2022-11-20: qty 2

## 2022-11-20 MED ORDER — MIDAZOLAM HCL 2 MG/2ML IJ SOLN
INTRAMUSCULAR | Status: DC | PRN
  Administered 2022-11-20 (×2): 1 mg via INTRAVENOUS

## 2022-11-20 MED ORDER — IODIXANOL 320 MG/ML IV SOLN
INTRAVENOUS | Status: DC | PRN
  Administered 2022-11-20: 80 mL

## 2022-11-20 MED ORDER — LIDOCAINE HCL (PF) 1 % IJ SOLN
INTRAMUSCULAR | Status: AC
  Filled 2022-11-20: qty 30

## 2022-11-20 MED ORDER — HEPARIN (PORCINE) IN NACL 1000-0.9 UT/500ML-% IV SOLN
INTRAVENOUS | Status: DC | PRN
  Administered 2022-11-20 (×2): 500 mL

## 2022-11-20 MED ORDER — CLOPIDOGREL BISULFATE 300 MG PO TABS
ORAL_TABLET | ORAL | Status: AC
  Filled 2022-11-20: qty 1

## 2022-11-20 MED ORDER — SODIUM CHLORIDE 0.9% FLUSH: 3.0000 mL | Freq: Two times a day (BID) | INTRAVENOUS | Status: DC

## 2022-11-20 MED ORDER — SODIUM CHLORIDE 0.9 % IV SOLN: 250.0000 mL | INTRAVENOUS | Status: DC | PRN

## 2022-11-20 MED ORDER — HYDRALAZINE HCL 20 MG/ML IJ SOLN: 5.0000 mg | INTRAMUSCULAR | Status: DC | PRN

## 2022-11-20 MED ORDER — SODIUM CHLORIDE 0.9% FLUSH: 3.0000 mL | INTRAVENOUS | Status: DC | PRN

## 2022-11-20 MED ORDER — FENTANYL CITRATE (PF) 100 MCG/2ML IJ SOLN
INTRAMUSCULAR | Status: AC
  Filled 2022-11-20: qty 2

## 2022-11-20 SURGICAL SUPPLY — 17 items
CATH OMNI FLUSH 5F 65CM (CATHETERS) IMPLANT
CATH QUICKCROSS .018X135CM (MICROCATHETER) IMPLANT
CATH SHOCKWAVE M5 4.5X60 (CATHETERS) IMPLANT
COVER DOME SNAP 22 D (MISCELLANEOUS) IMPLANT
DEVICE CLOSURE MYNXGRIP 6/7F (Vascular Products) IMPLANT
KIT ENCORE 26 ADVANTAGE (KITS) IMPLANT
KIT MICROPUNCTURE NIT STIFF (SHEATH) IMPLANT
KIT SYRINGE INJ CVI SPIKEX1 (MISCELLANEOUS) IMPLANT
SET ATX-X65L (MISCELLANEOUS) IMPLANT
SHEATH CATAPULT 6FR 45 (SHEATH) IMPLANT
SHEATH PINNACLE 5F 10CM (SHEATH) IMPLANT
SHEATH PINNACLE 6F 10CM (SHEATH) IMPLANT
SHEATH PROBE COVER 6X72 (BAG) IMPLANT
TRAY PV CATH (CUSTOM PROCEDURE TRAY) ×3 IMPLANT
WIRE BENTSON .035X145CM (WIRE) IMPLANT
WIRE G V18X300CM (WIRE) IMPLANT
WIRE SPARTACORE .014X300CM (WIRE) IMPLANT

## 2022-11-20 NOTE — Telephone Encounter (Signed)
PT currently in the hospital

## 2022-11-20 NOTE — Telephone Encounter (Signed)
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  Ultrasound-guided access left common femoral artery 2.  Aortogram with catheter selection of aorta 3.  Right lower extremity arteriogram with selection of third order branches 4.  Balloon angioplasty with shockwave lithotripsy of the right above-knee popliteal artery adjacent to the knee joint (4.5 mm x 60 mm Shockwave M5 x 300 pulses) 5.  Mynx closure of the left common femoral artery 6.  52 minutes of monitored moderate conscious sedation time  #Can you arrange follow-up in one month with right leg arterial duplex and ABI?  Thanks,  Thayer Ohm

## 2022-11-20 NOTE — Progress Notes (Signed)
Per Dr .Chestine Spore patient may resume eliquis tomorrow.(11/21/22)

## 2022-11-20 NOTE — ED Triage Notes (Signed)
Pt states taht she is constipated. She reports last BM was x 2 days ago. She has tried Miralax with no success. Pt states that she has some nausea, as well.

## 2022-11-20 NOTE — Op Note (Signed)
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  Ultrasound-guided access left common femoral artery 2.  Aortogram with catheter selection of aorta 3.  Right lower extremity arteriogram with selection of third order branches 4.  Balloon angioplasty with shockwave lithotripsy of the right above-knee popliteal artery adjacent to the knee joint (4.5 mm x 60 mm Shockwave M5 x 300 pulses) 5.  Mynx closure of the left common femoral artery 6.  52 minutes of monitored moderate conscious sedation time  Indications: 72 year old female well-known to vascular surgery having previously undergone right lower extremity intervention including SFA above-knee popliteal stenting.  Recent duplex showed high-grade stenosis distal to existing above-knee popliteal stents with a velocity of 428.  She also had a drop in her ABIs from 0.87 to 0.34 in the right foot.  She presents for right lower extremity arteriogram with possible intervention after risks benefits discussed.  Findings:   Ultrasound-guided access left common femoral artery.  Aortogram showed patent right renal artery and patent SMA.  Was difficult to visualize left renal artery.  Infrarenal aorta was widely patent.  Both iliacs were wifely patent.    On the right she had a patent common femoral and profunda.  Her proximal SFA was diseased without flow-limiting stenosis.  The right SFA above-knee popliteal stents were widely patent.  Just distal to her stents in the right leg in the above-knee popliteal artery adjacent to the knee joint she had a focal high-grade stenosis over 80%.  She had three-vessel runoff distally.  This lesion was crossed from contralateral groin access and I used  shockwave lithotripsy with a 4.5 mm x 60 mm shockwave balloon x 300 pulses.  No significant residual stenosis and no dissection.  Much better filling of the tibials with preserved runoff.  Dominant runoff is in the anterior tibial.   Procedure:  The patient was identified in the holding area and taken to room 8.  The patient was then placed supine on the table and prepped and draped in the usual sterile fashion.  A time out was called.  Patient received Versed and fentanyl for conscious moderate sedation.  Vital signs were monitored including heart rate, respiratory rate, oxygenation and blood pressure.  I was present for all of moderate sedation.  Ultrasound was used to evaluate the left common femoral artery.  It was patent.  A digital ultrasound image was acquired.  A micropuncture needle was used to access the left common femoral artery under ultrasound guidance.  An 018 wire was advanced without resistance and a micropuncture sheath was placed.  The 018 wire was removed and a benson wire was placed.  The micropuncture sheath was exchanged for a 5 french sheath.  An omniflush catheter was advanced over the wire to the level of L-1.  An abdominal angiogram was obtained.  Next, using the omniflush catheter and a benson wire, the aortic bifurcation was crossed and the catheter was placed into theright external iliac artery and right runoff was obtained.  Ultimately elected for intervention used a Bentson wire down the right SFA to exchanged for a long 6 French Catapault sheath in the left groin over the aortic bifurcation.  Patient was given 100 units per kilogram IV heparin.  I then used a V18 wire with a quick cross catheter  to get down the right leg SFA above-knee popliteal stents.  I had some trouble crossing the stenosis in the above-knee popliteal artery but finally got a wire down the posterior tibial.  I exchanged for an 014 wire.  I then selected shockwave lithotripsy balloon and used a 4.5 mm by 60 mm  balloon and we did a total of 300 pulses behind the knee after this was inflated to 4 atm.  Final imaging showed excellent results with much better flow distally with no significant residual stenosis.  Wires and catheters were removed.  I put a short 6 French sheath in the left groin and used a mynx closure.  Findings: Excellent results after shockwave lithotripsy of right popliteal artery stenosis with complete resolution.  Continue Eliquis and Plavix and statin.  Will arrange follow-up in 1 month with noninvasive imaging.  Cephus Shelling, MD [facility] Office: [phone]

## 2022-11-20 NOTE — H&P (Signed)
History and Physical Interval Note:  11/20/2022 7:42 AM  Tammy Good  has presented today for surgery, with the diagnosis of stenosis of peripheral vasculary stent.  The various methods of treatment have been discussed with the patient and family. After consideration of risks, benefits and other options for treatment, the patient has consented to  Procedure(s): ABDOMINAL AORTOGRAM W/LOWER EXTREMITY (N/A) as a surgical intervention.  The patient's history has been reviewed, patient examined, no change in status, stable for surgery.  I have reviewed the patient's chart and labs.  Questions were answered to the patient's satisfaction.     Cephus Shelling       Office Note     History of Present Illness    Tammy Good is a 72 y.o. (1950-05-24) female who presents as a triage visit.  She has a history of right SFA stenting in August 2022 for a right foot wound which is now healed.  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   She was last seen in our office in January and was asymptomatic at that time.  Her ABIs were normal and arterial duplex demonstrated a patent right SFA stent.   She returns today as an urgent visit.  She recently saw podiatry last week for ongoing foot swelling and pain.  She was concerned about wearing compression stockings given her arterial disease, so ABIs were ordered. Her ABIs show a drastic drop in blood flow in the RLE so she was sent back to our office for workup.   On exam today she is doing well.  She has no wounds of the right lower extremity.  She still has some ongoing right foot/ankle swelling.  She denies any rest pain or claudication.  She mostly walks around her house with a walker.  She endorses occasional tingling of her toes on the right.       Current Outpatient Medications  Medication Sig Dispense Refill   apixaban (ELIQUIS) 5  MG TABS tablet Take 1 tablet (5 mg total) by mouth 2 (two) times daily. 180 tablet 1   atorvastatin (LIPITOR) 40 MG tablet Take 1 tablet (40 mg total) by mouth daily at 6 PM. (Patient taking differently: Take 40 mg by mouth daily.) 30 tablet 0   B-D UF III MINI PEN NEEDLES 31G X 5 MM MISC Inject into the skin.       clopidogrel (PLAVIX) 75 MG tablet Take 1 tablet (75 mg total) by mouth daily. 90 tablet 3   Continuous Blood Gluc Sensor (FREESTYLE LIBRE 2 SENSOR) MISC Apply topically every 14 (fourteen) days.       cycloSPORINE (RESTASIS) 0.05 % ophthalmic emulsion 1 drop 2 (two) times daily.       dapagliflozin propanediol (FARXIGA) 10 MG TABS tablet Take 10 mg by mouth daily.       dexamethasone (DECADRON) 0.5 MG tablet Take 1 tablet (0.5 mg total) by mouth daily.       diclofenac Sodium (VOLTAREN) 1 % GEL Apply 4 g topically 4 (four) times daily. 400 g 0   docusate sodium (COLACE) 100 MG capsule Take 1 capsule (100 mg total) by mouth 2 (two) times daily. 60 capsule 0   doxycycline (VIBRA-TABS) 100 MG tablet Take 1 tablet (100 mg total) by mouth 2 (two) times daily. 14 tablet 0   ferrous sulfate 325 (65 FE) MG tablet Take 325 mg by  mouth at bedtime.       insulin aspart (NOVOLOG FLEXPEN) 100 UNIT/ML FlexPen Inject 0-7 Units into the skin 3 (three) times daily with meals. Sliding scale insulin       Insulin Glargine (BASAGLAR KWIKPEN) 100 UNIT/ML Inject 24 Units into the skin at bedtime. (Patient taking differently: Inject 18 Units into the skin at bedtime.)       levothyroxine (SYNTHROID) 125 MCG tablet Take 1 tablet (125 mcg total) by mouth daily before breakfast. 30 tablet 2   midodrine (PROAMATINE) 5 MG tablet Take 1 tablet (5 mg total) by mouth 2 (two) times daily with breakfast and lunch.       mirabegron ER (MYRBETRIQ) 50 MG TB24 tablet Take 50 mg by mouth daily.       mupirocin ointment (BACTROBAN) 2 % Apply 1 Application topically 2 (two) times daily. 30 g 2   nitroGLYCERIN (NITROSTAT) 0.4  MG SL tablet Place 0.4 mg under the tongue every 5 (five) minutes as needed for chest pain.       pantoprazole (PROTONIX) 40 MG tablet Take 40 mg by mouth daily.       polyethylene glycol powder (GLYCOLAX/MIRALAX) 17 GM/SCOOP powder Take 1 capful with water (17 g) by mouth daily. (Patient taking differently: Take 17 g by mouth daily as needed (constipation.).) 238 g 0   Propylene Glycol (SYSTANE COMPLETE) 0.6 % SOLN Place 1 drop into both eyes 2 (two) times daily as needed (dry/irritated eyes.).       tamsulosin (FLOMAX) 0.4 MG CAPS capsule Take 1 capsule (0.4 mg total) by mouth daily. 30 capsule 0   trimethoprim (TRIMPEX) 100 MG tablet Take 100 mg by mouth daily.       furosemide (LASIX) 20 MG tablet Take 1 tablet (20 mg total) by mouth daily as needed for fluid or edema. (Patient not taking: Reported on 09/22/2022) 30 tablet 0      No current facility-administered medications for this visit.        REVIEW OF SYSTEMS (negative unless checked):    Cardiac:  []  Chest pain or chest pressure? []  Shortness of breath upon activity? []  Shortness of breath when lying flat? []  Irregular heart rhythm?   Vascular:  []  Pain in calf, thigh, or hip brought on by walking? []  Pain in feet at night that wakes you up from your sleep? []  Blood clot in your veins? [x]  Leg swelling?   Pulmonary:  []  Oxygen at home? []  Productive cough? []  Wheezing?   Neurologic:  []  Sudden weakness in arms or legs? []  Sudden numbness in arms or legs? []  Sudden onset of difficult speaking or slurred speech? []  Temporary loss of vision in one eye? []  Problems with dizziness?   Gastrointestinal:  []  Blood in stool? []  Vomited blood?   Genitourinary:  []  Burning when urinating? []  Blood in urine?   Psychiatric:  []  Major depression   Hematologic:  []  Bleeding problems? []  Problems with blood clotting?   Dermatologic:  []  Rashes or ulcers?   Constitutional:  []  Fever or chills?   Ear/Nose/Throat:   []  Change in hearing? []  Nose bleeds? []  Sore throat?   Musculoskeletal:  []  Back pain? []  Joint pain? []  Muscle pain?     Physical Examination       Vitals:    11/06/22 0820  BP: (!) 146/72  Pulse: 73  Resp: 20  Temp: 98.6 F (37 C)  TempSrc: Temporal  SpO2: 99%  Weight: 211 lb 9.6 oz (96  kg)  Height: 5\' 7"  (1.702 m)    Body mass index is 33.14 kg/m.   General:  WDWN in NAD; vital signs documented above Gait: Not observed HENT: WNL, normocephalic Pulmonary: normal non-labored breathing , without rales, rhonchi,  wheezing Cardiac: regular Abdomen: soft, NT, no masses Skin: without rashes Vascular Exam/Pulses: palpable femoral pulses bilaterally. Non palpable right popliteal or DP/PT pulses. Monophasic right DP/PT doppler signals Extremities: without ischemic changes, without gangrene , without cellulitis; without open wounds; healed left BKA Musculoskeletal: no muscle wasting or atrophy       Neurologic: A&O X 3;  No focal weakness or paresthesias are detected Psychiatric:  The pt has Normal affect.   Non-Invasive Vascular imaging    ABI (11/05/2022) R:  ABI: 0.34 (0.87),  PT: mono DP: mono TBI:  0   Healed left BKA     RLE Arterial Duplex (11/06/2022) +-----------+--------+-----+---------------+----------+--------+  RIGHT     PSV cm/sRatioStenosis       Waveform  Comments  +-----------+--------+-----+---------------+----------+--------+  CFA Prox   233          50-74% stenosismonophasic          +-----------+--------+-----+---------------+----------+--------+  DFA       204          50-74% stenosismonophasic          +-----------+--------+-----+---------------+----------+--------+  SFA Prox   217          50-74% stenosismonophasic          +-----------+--------+-----+---------------+----------+--------+  SFA Mid    64                          monophasic           +-----------+--------+-----+---------------+----------+--------+  POP Distal 257          50-74% stenosismonophasic          +-----------+--------+-----+---------------+----------+--------+  ATA Distal 47                          monophasic          +-----------+--------+-----+---------------+----------+--------+  PTA Distal 54                          monophasic          +-----------+--------+-----+---------------+----------+--------+  PERO Distal39                          monophasic          +-----------+--------+-----+---------------+----------+--------+    Right Stent(s):  +---------------+--------+---------------+----------+--------+  SFA-POP       PSV cm/sStenosis       Waveform  Comments  +---------------+--------+---------------+----------+--------+  Prox to Stent  51                     monophasic          +---------------+--------+---------------+----------+--------+  Proximal Stent 45                     monophasic          +---------------+--------+---------------+----------+--------+  Mid Stent      45                     monophasic          +---------------+--------+---------------+----------+--------+  Distal Stent   58  monophasic          +---------------+--------+---------------+----------+--------+  Distal to [id number]     50-99% stenosismonophasic            Medical Decision Making    Tammy Good is a 72 y.o. female who presents for urgent follow up   Based on the patient's vascular studies, her ABIs on the right have significantly dropped since January.  Her right ABI has gone from 0.87 to 0.34. Right lower extremity arterial duplex performed today demonstrates a patent SFA pop/stent without stenosis.  There is 50 to 74% stenosis proximal to the stent in the proximal SFA with a PSV of 217. There is also 50-99% stenosis distal to the stent in the popliteal artery with a PSV of  428. 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 This stenosis puts her stent at risk for thrombosis if not intervened on. She currently denies any rest pain, claudication, tissue loss. On exam she has palpable femoral pulses bilaterally. She has nonpalpable right popliteal or pedal pulses. She has monophasic right DP/PT doppler signals She would benefit from RLE angiogram to maintain a patent stent. We will schedule her for an abdominal aortogram with RLE runoff, possible SFA atherectomy/stenting, and possible tibial angioplasty in the next 1-2 wks with Dr.Seriah Brotzman. She will hold her Eliquis 3 days prior.     Loel Dubonnet PA-C [facility] Office: [phone]   Clinic MD: Randie Heinz

## 2022-11-21 ENCOUNTER — Emergency Department (HOSPITAL_BASED_OUTPATIENT_CLINIC_OR_DEPARTMENT_OTHER): Payer: Medicare Other

## 2022-11-21 ENCOUNTER — Emergency Department (HOSPITAL_BASED_OUTPATIENT_CLINIC_OR_DEPARTMENT_OTHER)
Admission: EM | Admit: 2022-11-21 | Discharge: 2022-11-21 | Disposition: A | Discharge status: Home / Self Care | Payer: Medicare Other | Source: Home / Self Care | Attending: Emergency Medicine | Admitting: Emergency Medicine

## 2022-11-21 ENCOUNTER — Encounter (HOSPITAL_COMMUNITY): Payer: Self-pay | Admitting: Vascular Surgery

## 2022-11-21 DIAGNOSIS — K5900 Constipation, unspecified: Secondary | ICD-10-CM

## 2022-11-21 DIAGNOSIS — T82858A Stenosis of vascular prosthetic devices, implants and grafts, initial encounter: Secondary | ICD-10-CM | POA: Diagnosis not present

## 2022-11-21 LAB — CBC WITH DIFFERENTIAL/PLATELET
Abs Immature Granulocytes: 0.04 10*3/uL (ref 0.00–0.07)
Basophils Absolute: 0.1 10*3/uL (ref 0.0–0.1)
Basophils Relative: 1 %
Eosinophils Absolute: 0.2 10*3/uL (ref 0.0–0.5)
Eosinophils Relative: 1 %
HCT: 29.9 % — ABNORMAL LOW (ref 36.0–46.0)
Hemoglobin: 8.9 g/dL — ABNORMAL LOW (ref 12.0–15.0)
Immature Granulocytes: 0 %
Lymphocytes Relative: 10 %
Lymphs Abs: 1 10*3/uL (ref 0.7–4.0)
MCH: 28.7 pg (ref 26.0–34.0)
MCHC: 29.8 g/dL — ABNORMAL LOW (ref 30.0–36.0)
MCV: 96.5 fL (ref 80.0–100.0)
Monocytes Absolute: 1.1 10*3/uL — ABNORMAL HIGH (ref 0.1–1.0)
Monocytes Relative: 10 %
Neutro Abs: 8.1 10*3/uL — ABNORMAL HIGH (ref 1.7–7.7)
Neutrophils Relative %: 78 %
Platelets: 549 10*3/uL — ABNORMAL HIGH (ref 150–400)
RBC: 3.1 MIL/uL — ABNORMAL LOW (ref 3.87–5.11)
RDW: 16.7 % — ABNORMAL HIGH (ref 11.5–15.5)
WBC: 10.4 10*3/uL (ref 4.0–10.5)
nRBC: 0 % (ref 0.0–0.2)

## 2022-11-21 LAB — COMPREHENSIVE METABOLIC PANEL
ALT: 17 U/L (ref 0–44)
AST: 13 U/L — ABNORMAL LOW (ref 15–41)
Albumin: 3.5 g/dL (ref 3.5–5.0)
Alkaline Phosphatase: 88 U/L (ref 38–126)
Anion gap: 9 (ref 5–15)
BUN: 16 mg/dL (ref 8–23)
CO2: 24 mmol/L (ref 22–32)
Calcium: 8.4 mg/dL — ABNORMAL LOW (ref 8.9–10.3)
Chloride: 106 mmol/L (ref 98–111)
Creatinine, Ser: 0.77 mg/dL (ref 0.44–1.00)
GFR, Estimated: >60 mL/min (ref >60)
Glucose, Bld: 159 mg/dL — ABNORMAL HIGH (ref 70–99)
Potassium: 3.4 mmol/L — ABNORMAL LOW (ref 3.5–5.1)
Sodium: 139 mmol/L (ref 135–145)
Total Bilirubin: 0.8 mg/dL (ref 0.3–1.2)
Total Protein: 6.5 g/dL (ref 6.5–8.1)

## 2022-11-21 LAB — LIPASE, BLOOD: Lipase: 13 U/L (ref 11–51)

## 2022-11-21 MED ORDER — MINERAL OIL RE ENEM
1.0000 | ENEMA | Freq: Once | RECTAL | Status: AC
  Administered 2022-11-21: 1 via RECTAL
  Filled 2022-11-21: qty 1

## 2022-11-21 MED ORDER — SODIUM CHLORIDE 0.9 % IV BOLUS
500.0000 mL | Freq: Once | INTRAVENOUS | Status: AC
  Administered 2022-11-21: 500 mL via INTRAVENOUS

## 2022-11-21 MED ORDER — IOHEXOL 300 MG/ML  SOLN
100.0000 mL | Freq: Once | INTRAMUSCULAR | Status: AC | PRN
  Administered 2022-11-21: 85 mL via INTRAVENOUS

## 2022-11-21 NOTE — Discharge Instructions (Signed)
Drink one 10 ounce bottle of magnesium citrate for relief of constipation.  This medication is available over-the-counter.  Follow-up with primary doctor if not improving in the next few days, and return to the ER if you develop severe abdominal pain, high fevers, bloody stools, or for other new and concerning symptoms.

## 2022-11-21 NOTE — ED Provider Notes (Signed)
[place] EMERGENCY DEPARTMENT AT [facility] Provider Note   CSN: [id number] Arrival date & time: 11/20/22  2222     History  Chief Complaint  Patient presents with   Constipation    MIKALYA BRUZZESE is a 72 y.o. female.  Patient is a 72 year old female with past medical history of coronary artery disease with stent, hypertension, peripheral artery disease, atrial fibrillation, type 2 diabetes, CHF.  Patient presenting today with complaints of constipation.  She describes having not had a bowel movement in the past 2 or 3 days.  She feels as though there is stool at her rectum that she cannot push out.  No fevers or chills.  She denies any abdominal pain.  The history is provided by the patient.       Home Medications Prior to Admission medications   Medication Sig Start Date End Date Taking? Authorizing Provider  apixaban (ELIQUIS) 5 MG TABS tablet Take 1 tablet (5 mg total) by mouth 2 (two) times daily. 09/16/22   Marykay Lex, MD  atorvastatin (LIPITOR) 40 MG tablet Take 1 tablet (40 mg total) by mouth daily at 6 PM. 09/03/13   Sharon Seller, Elpidio Eric, MD  B-D UF III MINI PEN NEEDLES 31G X 5 MM MISC Inject into the skin. 05/28/20   [provider]  clopidogrel (PLAVIX) 75 MG tablet Take 1 tablet (75 mg total) by mouth daily. 10/15/22   Marykay Lex, MD  Continuous Blood Gluc Sensor (FREESTYLE LIBRE 2 SENSOR) MISC Apply topically every 14 (fourteen) days. 06/12/20   [provider]  cycloSPORINE (RESTASIS) 0.05 % ophthalmic emulsion Place 1 drop into both eyes 2 (two) times daily as needed (dry eyes).    [provider]  dapagliflozin propanediol (FARXIGA) 10 MG TABS tablet Take 10 mg by mouth daily.    [provider]  dexamethasone (DECADRON) 0.5 MG tablet Take 1 tablet (0.5 mg total) by mouth daily. 01/30/22 01/30/23  Love, Evlyn Kanner, PA-C  diclofenac Sodium (VOLTAREN) 1 % GEL Apply 4 g topically 4 (four) times daily. Patient taking  differently: Apply 4 g topically 4 (four) times daily as needed (knee pain). 01/30/22   Love, Evlyn Kanner, PA-C  ferrous sulfate 325 (65 FE) MG tablet Take 325 mg by mouth at bedtime.    [provider]  insulin aspart (NOVOLOG FLEXPEN) 100 UNIT/ML FlexPen Inject 2-10 Units into the skin 3 (three) times daily with meals. Sliding scale insulin    [provider]  Insulin Glargine (BASAGLAR KWIKPEN) 100 UNIT/ML Inject 24 Units into the skin at bedtime. Patient taking differently: Inject 18 Units into the skin at bedtime. 01/30/22   Love, Evlyn Kanner, PA-C  levothyroxine (SYNTHROID) 125 MCG tablet Take 1 tablet (125 mcg total) by mouth daily before breakfast. 11/21/20   Baglia, Corrina, PA-C  midodrine (PROAMATINE) 5 MG tablet Take 1 tablet (5 mg total) by mouth 2 (two) times daily with breakfast and lunch. Patient taking differently: Take 5 mg by mouth 2 (two) times daily with a meal. Lunch and Dinner 01/31/22   Love, Evlyn Kanner, PA-C  mupirocin ointment (BACTROBAN) 2 % Apply 1 Application topically 2 (two) times daily. Patient not taking: Reported on 11/12/2022 08/29/22   Vivi Barrack, DPM  nitroGLYCERIN (NITROSTAT) 0.4 MG SL tablet Place 0.4 mg under the tongue every 5 (five) minutes as needed for chest pain. 08/23/21   [provider]  oxybutynin (DITROPAN-XL) 10 MG 24 hr tablet Take 10 mg by mouth daily  after lunch.    [provider]  pantoprazole (PROTONIX) 40 MG tablet Take 40 mg by mouth daily.    [provider]  polyethylene glycol powder (GLYCOLAX/MIRALAX) 17 GM/SCOOP powder Take 1 capful with water (17 g) by mouth daily. Patient taking differently: Take 17 g by mouth daily as needed (constipation.). 06/04/21   Rai, Delene Ruffini, MD  Propylene Glycol (SYSTANE COMPLETE) 0.6 % SOLN Place 1 drop into both eyes 2 (two) times daily as needed (dry/irritated eyes.).    [provider]  trimethoprim (TRIMPEX) 100 MG tablet Take 100 mg by mouth at bedtime.     [provider]      Allergies    Strawberry extract    Review of Systems   Review of Systems  All other systems reviewed and are negative.   Physical Exam Updated Vital Signs BP 135/67 (BP Location: Left Wrist)   Pulse 80   Temp 97.9 F (36.6 C)   Resp 16   SpO2 99%  Physical Exam Vitals and nursing note reviewed.  Constitutional:      General: She is not in acute distress.    Appearance: She is well-developed. She is not diaphoretic.  HENT:     Head: Normocephalic and atraumatic.  Cardiovascular:     Rate and Rhythm: Normal rate and regular rhythm.     Heart sounds: No murmur heard.    No friction rub. No gallop.  Pulmonary:     Effort: Pulmonary effort is normal. No respiratory distress.     Breath sounds: Normal breath sounds. No wheezing.  Abdominal:     General: Bowel sounds are normal. There is no distension.     Palpations: Abdomen is soft.     Tenderness: There is no abdominal tenderness.  Musculoskeletal:        General: Normal range of motion.     Cervical back: Normal range of motion and neck supple.  Skin:    General: Skin is warm and dry.  Neurological:     General: No focal deficit present.     Mental Status: She is alert and oriented to person, place, and time.     ED Results / Procedures / Treatments   Labs (all labs ordered are listed, but only abnormal results are displayed) Labs Reviewed - No data to display  EKG None  Radiology PERIPHERAL VASCULAR CATHETERIZATION  Result Date: 11/20/2022 Images from the original result were not included.   Patient name: FARYN CHRZANOWSKI   MRN: [id number]        DOB: 03-21-1951            Sex: female  11/20/2022 Pre-operative Diagnosis: High-grade stenosis >80% right above-knee popliteal artery distal to existing SFA above-knee popliteal stents Post-operative diagnosis:  Same Surgeon:  Cephus Shelling, MD Procedure Performed: 1.  Ultrasound-guided access left common femoral artery 2.  Aortogram  with catheter selection of aorta 3.  Right lower extremity arteriogram with selection of third order branches 4.  Balloon angioplasty with shockwave lithotripsy of the right above-knee popliteal artery adjacent to the knee joint (4.5 mm x 60 mm Shockwave M5 x 300 pulses) 5.  Mynx closure of the left common femoral artery 6.  52 minutes of monitored moderate conscious sedation time  Indications: 72 year old female well-known to vascular surgery having previously undergone right lower extremity intervention including SFA above-knee popliteal stenting.  Recent duplex showed high-grade stenosis distal to existing above-knee popliteal stents with a velocity of 428.  She  also had a drop in her ABIs from 0.87 to 0.34 in the right foot.  She presents for right lower extremity arteriogram with possible intervention after risks benefits discussed.  Findings:  Ultrasound-guided access left common femoral artery.  Aortogram showed patent right renal artery and patent SMA.  Was difficult to visualize left renal artery.  Infrarenal aorta was widely patent.  Both iliacs were wifely patent.   On the right she had a patent common femoral and profunda.  Her proximal SFA was diseased without flow-limiting stenosis.  The right SFA above-knee popliteal stents were widely patent.  Just distal to her stents in the right leg in the above-knee popliteal artery adjacent to the knee joint she had a focal high-grade stenosis over 80%.  She had three-vessel runoff distally.  This lesion was crossed from contralateral groin access and I used shockwave lithotripsy with a 4.5 mm x 60 mm shockwave balloon x 300 pulses.  No significant residual stenosis and no dissection.  Much better filling of the tibials with preserved runoff.  Dominant runoff is in the anterior tibial.             Procedure:  The patient was identified in the holding area and taken to room 8.  The patient was then placed supine on the table and prepped and draped in the usual  sterile fashion.  A time out was called.  Patient received Versed and fentanyl for conscious moderate sedation.  Vital signs were monitored including heart rate, respiratory rate, oxygenation and blood pressure.  I was present for all of moderate sedation.  Ultrasound was used to evaluate the left common femoral artery.  It was patent.  A digital ultrasound image was acquired.  A micropuncture needle was used to access the left common femoral artery under ultrasound guidance.  An 018 wire was advanced without resistance and a micropuncture sheath was placed.  The 018 wire was removed and a benson wire was placed.  The micropuncture sheath was exchanged for a 5 french sheath.  An omniflush catheter was advanced over the wire to the level of L-1.  An abdominal angiogram was obtained.  Next, using the omniflush catheter and a benson wire, the aortic bifurcation was crossed and the catheter was placed into theright external iliac artery and right runoff was obtained.  Ultimately elected for intervention used a Bentson wire down the right SFA to exchanged for a long 6 French Catapault sheath in the left groin over the aortic bifurcation.  Patient was given 100 units per kilogram IV heparin.  I then used a V18 wire with a quick cross catheter to get down the right leg SFA above-knee popliteal stents.  I had some trouble crossing the stenosis in the above-knee popliteal artery but finally got a wire down the posterior tibial.  I exchanged for an 014 wire.  I then selected shockwave lithotripsy balloon and used a 4.5 mm by 60 mm balloon and we did a total of 300 pulses behind the knee after this was inflated to 4 atm.  Final imaging showed excellent results with much better flow distally with no significant residual stenosis.  Wires and catheters were removed.  I put a short 6 French sheath in the left groin and used a mynx closure.  Findings: Excellent results after shockwave lithotripsy of right popliteal artery stenosis  with complete resolution.  Continue Eliquis and Plavix and statin.  Will arrange follow-up in 1 month with noninvasive imaging.  Cephus Shelling, MD  [facility] Office: [phone]    Procedures Procedures    Medications Ordered in ED Medications  mineral oil enema 1 enema (has no administration in time range)    ED Course/ Medical Decision Making/ A&P  Patient is a 72 year old female presenting with complaints of constipation.  She has not had a bowel movement couple of days and feels as though there is a ball of stool she cannot push out.  She arrives here with stable vital signs and is afebrile.  Patient given a fleets enema with little results.  Digital rectal exam reveals minimal stool in the rectal vault.  Laboratory studies then obtained including CBC, CMP, and lipase, all of which are unremarkable.  Patient sent for a CT scan of the abdomen and pelvis, showing constipation, but no evidence for fecal impaction.  Patient will be discharged with magnesium citrate and as needed return.  Final Clinical Impression(s) / ED Diagnoses Final diagnoses:  None    Rx / DC Orders ED Discharge Orders     None         Geoffery Lyons, MD 11/21/22 [phone]

## 2022-11-25 NOTE — Telephone Encounter (Signed)
Pt appts scheduled

## 2022-11-28 ENCOUNTER — Ambulatory Visit (INDEPENDENT_AMBULATORY_CARE_PROVIDER_SITE_OTHER): Payer: Medicare Other | Admitting: Podiatry

## 2022-11-28 ENCOUNTER — Encounter: Payer: Self-pay | Admitting: Podiatry

## 2022-11-28 DIAGNOSIS — I739 Peripheral vascular disease, unspecified: Secondary | ICD-10-CM

## 2022-11-28 DIAGNOSIS — B351 Tinea unguium: Secondary | ICD-10-CM

## 2022-11-28 DIAGNOSIS — R609 Edema, unspecified: Secondary | ICD-10-CM

## 2022-11-28 DIAGNOSIS — Z7901 Long term (current) use of anticoagulants: Secondary | ICD-10-CM

## 2022-12-04 NOTE — Progress Notes (Signed)
Subjective: Chief Complaint  Patient presents with   Foot Pain    Follow up swelling/pain right foot and ankle   "I had my circulation procedure, so hopefully it gets better now, but still just stays swollen"    72 year old female presents the office with above concerns.  States that she is doing better.  The swelling is better but still states swollen.  Since I saw her last she underwent angioplasty of the popliteal artery with Dr. Chestine Spore on November 20, 2022.  She does not report any ulcerations.  Nails on the right foot are thick and elongated she has difficulty trimming them.   Objective: AAO x3, NAD Foot warm on the right BKA on the left There is edema although improved along the right foot and ankle there is no erythema or warmth.  There is some mild discoloration syndrome dorsal forefoot there is not significantly changed.  There is no areas of fluctuation or crepitation.   Nails of the right are hypertrophic, dystrophic with brown discoloration and several debris present. No pain with calf compression, swelling, warmth, erythema  Assessment: Right foot, ankle swelling; PAD; onychomycosis on anticoagulation  Plan: -All treatment options discussed with the patient including all alternatives, risks, complications.  -Swelling is improved and this is likely to take time for this to be resolved.  Continue to follow-up with vascular surgery as well.  Monitor for any skin breakdown or signs or symptoms of infection. -Sharply debrided nails x 5 without any complications or bleeding.  No follow-ups on file.  Vivi Barrack DPM

## 2023-01-01 ENCOUNTER — Other Ambulatory Visit: Payer: Self-pay | Admitting: *Deleted

## 2023-01-01 DIAGNOSIS — I739 Peripheral vascular disease, unspecified: Secondary | ICD-10-CM

## 2023-01-01 DIAGNOSIS — T82856A Stenosis of peripheral vascular stent, initial encounter: Secondary | ICD-10-CM

## 2023-01-13 ENCOUNTER — Ambulatory Visit (HOSPITAL_COMMUNITY)
Admission: RE | Admit: 2023-01-13 | Discharge: 2023-01-13 | Disposition: A | Discharge status: Home / Self Care | Payer: Medicare Other | Source: Ambulatory Visit | Attending: Vascular Surgery | Admitting: Vascular Surgery

## 2023-01-13 ENCOUNTER — Ambulatory Visit (INDEPENDENT_AMBULATORY_CARE_PROVIDER_SITE_OTHER)
Admission: RE | Admit: 2023-01-13 | Discharge: 2023-01-13 | Disposition: A | Discharge status: Home / Self Care | Payer: Medicare Other | Source: Ambulatory Visit | Attending: Vascular Surgery | Admitting: Vascular Surgery

## 2023-01-13 ENCOUNTER — Ambulatory Visit (INDEPENDENT_AMBULATORY_CARE_PROVIDER_SITE_OTHER): Payer: Medicare Other | Admitting: Physician Assistant

## 2023-01-13 VITALS — BP 132/78 | HR 95 | Temp 98.1°F | Resp 18 | Ht 67.0 in | Wt 212.1 lb

## 2023-01-13 DIAGNOSIS — I739 Peripheral vascular disease, unspecified: Secondary | ICD-10-CM

## 2023-01-13 DIAGNOSIS — M7989 Other specified soft tissue disorders: Secondary | ICD-10-CM

## 2023-01-13 DIAGNOSIS — I872 Venous insufficiency (chronic) (peripheral): Secondary | ICD-10-CM | POA: Diagnosis not present

## 2023-01-13 DIAGNOSIS — T82856A Stenosis of peripheral vascular stent, initial encounter: Secondary | ICD-10-CM | POA: Insufficient documentation

## 2023-01-13 DIAGNOSIS — I70213 Atherosclerosis of native arteries of extremities with intermittent claudication, bilateral legs: Secondary | ICD-10-CM | POA: Diagnosis not present

## 2023-01-13 LAB — VAS US ABI WITH/WO TBI: Right ABI: 0.71

## 2023-01-13 NOTE — Progress Notes (Signed)
Office Note     CC:  follow up Requesting Provider:  Adrian Prince, MD  HPI: Tammy Good is a 72 y.o. (03-06-1951) female who presents status post balloon angioplasty with shockwave lithotripsy of the right above-the-knee popliteal artery by Dr. Chestine Spore due to high-grade outflow stenosis of SFA/popliteal artery stents.  This was performed on 11/20/2022.  Patient denies any pain in the left groin catheterization site.  She is also without claudication, rest pain, or tissue loss.  She is complaining of right foot and ankle swelling however this is managed with proper leg elevation.  She is on Eliquis and Plavix daily.  She is a former smoker.  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  She is ambulatory with a prosthetic and a cane.   Past Medical History:  Diagnosis Date   CAD S/P percutaneous coronary angioplasty 08/2013   100% mRCA - PCI Integrity Resolute DES 3.0 mm x 38 mm - 3.35 mm; PTCA of RPA V 2.0 mm x 15 mm   CHF (congestive heart failure) (HCC)    Cholesteatoma    right   Diabetes mellitus type 2 in obese    On insulin and Invokana   History of osteomyelitis L 5th Toe all 05/2019   s/p Partial Ray Amputation with partial closure; 6 wks Abx & LSFA Atherectomy/DEB PTA with Stent for focal dissection.   Hyperlipidemia with target LDL less than 70    Hypothyroidism (acquired)    Mild essential hypertension    Obesity (BMI 30-39.9) 11/17/2013   PAD (peripheral artery disease) (HCC) 05/26/2019   05/26/19: Abd AoGram- BLE runoff -> L SFA orbital atherectomy - PTA w/ DEB & Stent 6 x 40 Luttonix (for focal dissection) - patent Pop A with 3 V runoff. LEA Dopplers 01/03/2020: RABI (prev) 0.68 (0.53)/ RTBI (prev) 0.58 (0.33); LABI (prev) 0.80 (0.64), LTBI (prev) 0.64 (0.51); R mSFA ~50-74%, L mSFA 50-74%. Patent Prox SFA stent < 49% stenosis   Panhypopituitarism (HCC)    ST elevation myocardial  infarction (STEMI) of inferior wall, subsequent episode of care (HCC) 08/2013   80% branch of D1, 40% mid AV groove circumflex, 100% RCA with subacute thrombus -- thrombus extending into RPA V with 100% occlusion after initial angioplasty of mid RCA ;; Post MI ECHO 6/9/'15: EF 50-55%, mild LVH with moderate HK of inferior wall, Gr1 DD, mild LA dilation; mildly reduced RV function    Past Surgical History:  Procedure Laterality Date   ABDOMINAL AORTOGRAM W/LOWER EXTREMITY N/A 05/26/2019   Procedure: ABDOMINAL AORTOGRAM W/LOWER EXTREMITY;  Surgeon: Cephus Shelling, MD;  [facility];  Service: Cardiovascular;  Laterality: N/A;   ABDOMINAL AORTOGRAM W/LOWER EXTREMITY N/A 11/15/2020   Procedure: ABDOMINAL AORTOGRAM W/LOWER EXTREMITY;  Surgeon: Cephus Shelling, MD;  [facility]: [facility];  Service: Cardiovascular;  Laterality: N/A;   ABDOMINAL AORTOGRAM W/LOWER EXTREMITY N/A 04/18/2021   Procedure: ABDOMINAL AORTOGRAM W/LOWER EXTREMITY;  Surgeon: Cephus Shelling, MD;  [facility]: [facility];  Service: Cardiovascular;  Laterality: N/A;   ABDOMINAL AORTOGRAM W/LOWER EXTREMITY Left 07/11/2021   Procedure: ABDOMINAL AORTOGRAM W/LOWER EXTREMITY;  Surgeon: Cephus Shelling, MD;  [facility];  Service: Cardiovascular;  Laterality: Left;   ABDOMINAL AORTOGRAM W/LOWER EXTREMITY N/A 11/20/2022   Procedure: ABDOMINAL AORTOGRAM W/LOWER EXTREMITY;  Surgeon: Cephus Shelling, MD;  [facility];  Service: Cardiovascular;  Laterality: N/A;  AMPUTATION Left 05/25/2019   Procedure: AMPUTATION RAY 5th;  Surgeon: Vivi Barrack, DPM;  Location: [facility] OR;  Service: Podiatry;  Laterality: Left;   AMPUTATION Left 01/14/2022   Procedure: LEFT BELOW THE KNEE AMPUTATION;  Surgeon: Terance Hart, MD;  Location: [facility] OR;  Service: Orthopedics;  Laterality: Left;  LENGTH OF SURGERY: 90 MINUTES   APPLICATION OF WOUND VAC Left 01/14/2022    Procedure: WOUND VAC PLACEMENT;  Surgeon: Terance Hart, MD;  Location: [facility] OR;  Service: Orthopedics;  Laterality: Left;   BONE BIOPSY Left 04/24/2021   Procedure: BONE BIOPSY;  Surgeon: Asencion Islam, DPM;  [facility];  Service: Podiatry;  Laterality: Left;  left ankle/fibula   BONE BIOPSY Left 07/15/2021   Procedure: LEFT FOOT BONE BIOPSY;  Surgeon: Vivi Barrack, DPM;  [facility]: [facility];  Service: Podiatry;  Laterality: Left;   Cardiac Event Monitor  July-August 2015   Sinus rhythm with PVCs   CHOLECYSTECTOMY     COLONOSCOPY N/A 08/31/2013   Procedure: COLONOSCOPY;  Surgeon: Charna Elizabeth, MD;  Location: [facility] ENDOSCOPY;  Service: Endoscopy;  Laterality: N/A;   CORONARY STENT INTERVENTION N/A 11/19/2020   Procedure: CORONARY STENT INTERVENTION;  Surgeon: Yvonne Kendall, MD;  [facility];  Service: Cardiovascular;  Laterality: N/A;   ESOPHAGOGASTRODUODENOSCOPY N/A 09/01/2013   Procedure: ESOPHAGOGASTRODUODENOSCOPY (EGD);  Surgeon: Theda Belfast, MD;  Location: [facility] ENDOSCOPY;  Service: Endoscopy;  Laterality: N/A;  bedside   ESOPHAGOGASTRODUODENOSCOPY (EGD) WITH PROPOFOL N/A 05/26/2021   Procedure: ESOPHAGOGASTRODUODENOSCOPY (EGD) WITH PROPOFOL;  Surgeon: Jeani Hawking, MD;  Location: [facility] ENDOSCOPY;  Service: Gastroenterology;  Laterality: N/A;   EYE SURGERY Bilateral    bilateral cataracts   INCISION AND DRAINAGE Left 04/24/2021   Procedure: INCISION AND DRAINAGE;  Surgeon: Asencion Islam, DPM;  [facility]: [facility];  Service: Podiatry;  Laterality: Left;   IRRIGATION AND DEBRIDEMENT FOOT Left 07/15/2021   Procedure: IRRIGATION AND DEBRIDEMENT FOOT;  Surgeon: Vivi Barrack, DPM;  [facility]: [facility];  Service: Podiatry;  Laterality: Left;   LEFT HEART CATH AND CORONARY ANGIOGRAPHY N/A 11/19/2020   Procedure: LEFT HEART CATH AND CORONARY ANGIOGRAPHY;  Surgeon: Yvonne Kendall, MD;  [facility];  Service: Cardiovascular;  Laterality: N/A;   LEFT  HEART CATHETERIZATION WITH CORONARY ANGIOGRAM N/A 08/30/2013   Procedure: LEFT HEART CATHETERIZATION WITH CORONARY ANGIOGRAM;  Surgeon: Marykay Lex, MD;  Location: [facility] CATH LAB: 100% mRCA (thrombus - extends to RPAV), 80% D1, 40% AVG Cx.   PERCUTANEOUS CORONARY STENT INTERVENTION (PCI-S)  08/30/2013   Procedure: PERCUTANEOUS CORONARY STENT INTERVENTION (PCI-S);  Surgeon: Marykay Lex, MD;  Location: [facility] CATH LAB;  Integrity Resolute DES 2.0 mm x 38 mm -- 3.35 mm.; PTCA of proximal RPA V. - 3.0 mm x 15 mm balloon   PERIPHERAL INTRAVASCULAR LITHOTRIPSY Right 11/20/2022   Procedure: PERIPHERAL INTRAVASCULAR LITHOTRIPSY;  Surgeon: Cephus Shelling, MD;  [facility];  Service: Cardiovascular;  Laterality: Right;  POP   PERIPHERAL VASCULAR ATHERECTOMY  05/26/2019   Procedure: PERIPHERAL VASCULAR ATHERECTOMY;  Surgeon: Cephus Shelling, MD;  [facility];  Service: Cardiovascular;;  Left SFA   PERIPHERAL VASCULAR BALLOON ANGIOPLASTY Left 07/11/2021   Procedure: PERIPHERAL VASCULAR BALLOON ANGIOPLASTY;  Surgeon: Cephus Shelling, MD;  [facility];  Service: Cardiovascular;  Laterality: Left;  PT TRUNK / AT   PERIPHERAL VASCULAR BALLOON ANGIOPLASTY Right 11/20/2022   Procedure: PERIPHERAL VASCULAR BALLOON ANGIOPLASTY;  Surgeon: Chestine Spore,  Canary Brim, MD;  [facility];  Service: Cardiovascular;  Laterality: Right;  POP   PERIPHERAL VASCULAR INTERVENTION  05/26/2019   Procedure: PERIPHERAL VASCULAR INTERVENTION;  Surgeon: Cephus Shelling, MD;  [facility];  Service: Cardiovascular;;  Left SFA   PERIPHERAL VASCULAR INTERVENTION Right 11/15/2020   Procedure: PERIPHERAL VASCULAR INTERVENTION;  Surgeon: Cephus Shelling, MD;  [facility];  Service: Cardiovascular;  Laterality: Right;  Superficial Femoral Artery   PERIPHERAL VASCULAR INTERVENTION Left 07/11/2021   Procedure: PERIPHERAL  VASCULAR INTERVENTION;  Surgeon: Cephus Shelling, MD;  [facility];  Service: Cardiovascular;  Laterality: Left;  SFA   PITUITARY SURGERY     TEE WITHOUT CARDIOVERSION N/A 04/26/2021   Procedure: TRANSESOPHAGEAL ECHOCARDIOGRAM (TEE);  Surgeon: Jodelle Red, MD;  Location: [facility] ENDOSCOPY;  Service: Cardiovascular;  Laterality: N/A;   TRANSTHORACIC ECHOCARDIOGRAM  08/30/2013   mild LVH. EF 50-55%. Moderate HK of the entire inferior myocardium. GR 1 DD. Mild LA dilation. Mildly reduced RV function   TYMPANOMASTOIDECTOMY Right 12/28/2017   Procedure: RIGHT TYMPANOMASTOIDECTOMY;  Surgeon: Newman Pies, MD;  Location: [facility];  Service: ENT;  Laterality: Right;    Social History   Socioeconomic History   Marital status: Widowed    Spouse name: Not on file   Number of children: 1   Years of education: Not on file   Highest education level: Not on file  Occupational History   Not on file  Tobacco Use   Smoking status: Former    Passive exposure: Never   Smokeless tobacco: Never  Vaping Use   Vaping status: Never Used  Substance and Sexual Activity   Alcohol use: No   Drug use: No   Sexual activity: Not Currently    Birth control/protection: Post-menopausal  Other Topics Concern   Not on file  Social History Narrative   Widow. Works at [employer].   Former smoker.   Overall not very active.  Hoping to get into water aerobics class.   Social Determinants of Health   Financial Resource Strain: Not on file  Food Insecurity: Not on file  Transportation Needs: Not on file  Physical Activity: Not on file  Stress: Not on file  Social Connections: Unknown (08/05/2021)   [facility]   Social Network    Social Network: Not on file  Intimate Partner Violence: Unknown (06/26/2021)   Received [facility]   HITS    Physically Hurt: Not on file    Insult or Talk Down To: Not on file     Threaten Physical Harm: Not on file    Scream or Curse: Not on file    Family History  Problem Relation Age of Onset   Cancer Mother 101       multiple myeloma   Heart attack Father 63   Cancer Sister    Alzheimer's disease Maternal Grandmother     Current Outpatient Medications  Medication Sig Dispense Refill   apixaban (ELIQUIS) 5 MG TABS tablet Take 1 tablet (5 mg total) by mouth 2 (two) times daily. 180 tablet 1   atorvastatin (LIPITOR) 40 MG tablet Take 1 tablet (40 mg total) by mouth daily at 6 PM. 30 tablet 0   B-D UF III MINI PEN NEEDLES 31G X 5 MM MISC Inject into the skin.     clopidogrel (PLAVIX) 75 MG tablet Take 1 tablet (75  mg total) by mouth daily. 90 tablet 3   Continuous Blood Gluc Sensor (FREESTYLE LIBRE 2 SENSOR) MISC Apply topically every 14 (fourteen) days.     cycloSPORINE (RESTASIS) 0.05 % ophthalmic emulsion Place 1 drop into both eyes 2 (two) times daily as needed (dry eyes).     dapagliflozin propanediol (FARXIGA) 10 MG TABS tablet Take 10 mg by mouth daily.     dexamethasone (DECADRON) 0.5 MG tablet Take 1 tablet (0.5 mg total) by mouth daily.     diclofenac Sodium (VOLTAREN) 1 % GEL Apply 4 g topically 4 (four) times daily. (Patient taking differently: Apply 4 g topically 4 (four) times daily as needed (knee pain).) 400 g 0   ferrous sulfate 325 (65 FE) MG tablet Take 325 mg by mouth at bedtime.     insulin aspart (NOVOLOG FLEXPEN) 100 UNIT/ML FlexPen Inject 2-10 Units into the skin 3 (three) times daily with meals. Sliding scale insulin     Insulin Glargine (BASAGLAR KWIKPEN) 100 UNIT/ML Inject 24 Units into the skin at bedtime. (Patient taking differently: Inject 18 Units into the skin at bedtime.)     levothyroxine (SYNTHROID) 125 MCG tablet Take 1 tablet (125 mcg total) by mouth daily before breakfast. 30 tablet 2   midodrine (PROAMATINE) 5 MG tablet Take 1 tablet (5 mg total) by mouth 2 (two) times daily with breakfast and lunch. (Patient taking  differently: Take 5 mg by mouth 2 (two) times daily with a meal. Lunch and Dinner)     nitroGLYCERIN (NITROSTAT) 0.4 MG SL tablet Place 0.4 mg under the tongue every 5 (five) minutes as needed for chest pain.     oxybutynin (DITROPAN-XL) 10 MG 24 hr tablet Take 10 mg by mouth daily after lunch.     pantoprazole (PROTONIX) 40 MG tablet Take 40 mg by mouth daily.     polyethylene glycol powder (GLYCOLAX/MIRALAX) 17 GM/SCOOP powder Take 1 capful with water (17 g) by mouth daily. (Patient taking differently: Take 17 g by mouth daily as needed (constipation.).) 238 g 0   Propylene Glycol (SYSTANE COMPLETE) 0.6 % SOLN Place 1 drop into both eyes 2 (two) times daily as needed (dry/irritated eyes.).     trimethoprim (TRIMPEX) 100 MG tablet Take 100 mg by mouth at bedtime.     mupirocin ointment (BACTROBAN) 2 % Apply 1 Application topically 2 (two) times daily. 30 g 2   No current facility-administered medications for this visit.    Allergies  Allergen Reactions   Strawberry Extract Itching, Swelling and Anaphylaxis    Mouth swells and gets itchy     REVIEW OF SYSTEMS:   [X]  denotes positive finding, [ ]  denotes negative finding Cardiac  Comments:  Chest pain or chest pressure:    Shortness of breath upon exertion:    Short of breath when lying flat:    Irregular heart rhythm:        Vascular    Pain in calf, thigh, or hip brought on by ambulation:    Pain in feet at night that wakes you up from your sleep:     Blood clot in your veins:    Leg swelling:         Pulmonary    Oxygen at home:    Productive cough:     Wheezing:         Neurologic    Sudden weakness in arms or legs:     Sudden numbness in arms or legs:     Sudden  onset of difficulty speaking or slurred speech:    Temporary loss of vision in one eye:     Problems with dizziness:         Gastrointestinal    Blood in stool:     Vomited blood:         Genitourinary    Burning when urinating:     Blood in urine:         Psychiatric    Major depression:         Hematologic    Bleeding problems:    Problems with blood clotting too easily:        Skin    Rashes or ulcers:        Constitutional    Fever or chills:      PHYSICAL EXAMINATION:  Vitals:   01/13/23 0848  BP: 132/78  Pulse: 95  Resp: 18  Temp: 98.1 F (36.7 C)  TempSrc: Temporal  SpO2: 99%  Weight: 212 lb 1.6 oz (96.2 kg)  Height: 5\' 7"  (1.702 m)    General:  WDWN in NAD; vital signs documented above Gait: Not observed HENT: WNL, normocephalic Pulmonary: normal non-labored breathing , without Rales, rhonchi,  wheezing Cardiac: regular HR Abdomen: soft, NT, no masses Skin: without rashes Vascular Exam/Pulses: Palpable right ATA pulse Extremities: without ischemic changes, without Gangrene , without cellulitis; without open wounds; left groin cath site without hematoma; pitting edema of the right foot and ankle without ulceration Musculoskeletal: no muscle wasting or atrophy  Neurologic: A&O X 3 Psychiatric:  The pt has Normal affect.   Non-Invasive Vascular Imaging:    Right lower extremity arterial duplex demonstrates biphasic flow throughout the right leg.  She continues to have elevated velocities in the mid popliteal artery greater than 400 cm/s  ABI/TBIToday's ABIToday's TBIPrevious ABIPrevious TBI  +-------+-----------+-----------+------------+------------+  Right 0.71       0.53       0.34        0.00          +-------+-----------+-----------+------------+------------+  Left  BKA        BKA        BKA         BKA            ASSESSMENT/PLAN:: 72 y.o. female status post balloon angioplasty and shockwave lithotripsy of the right above-the-knee popliteal artery  Right leg is well-perfused with a palpable ATA pulse.  Despite intervention of the right above-the-knee popliteal artery, there continues to be an elevated velocity greater than 400 cm/s in the area distal to the SFA stents.  Given that she  has a palpable pulse and that her ABIs and TBI's drastically improved postprocedure we will not return to the Cath Lab for angiography at this time.  This was discussed with Dr. Chestine Spore.  He would prefer to repeat arterial duplex and ABIs in 3 months to follow-up with him in office.  Okay for patient to wear light knee-high compression to help with edema.  She will continue her Eliquis and Plavix daily.  She will call/return office sooner with any questions or concerns or if she develops any rest pain or tissue loss of the right lower extremity.   Emilie Rutter, PA-C [facility] [phone]  Clinic MD:   Chestine Spore

## 2023-01-29 ENCOUNTER — Encounter: Payer: Self-pay | Admitting: Podiatry

## 2023-01-29 ENCOUNTER — Ambulatory Visit (INDEPENDENT_AMBULATORY_CARE_PROVIDER_SITE_OTHER): Payer: Medicare Other | Admitting: Podiatry

## 2023-01-29 DIAGNOSIS — Z7901 Long term (current) use of anticoagulants: Secondary | ICD-10-CM

## 2023-01-29 DIAGNOSIS — B351 Tinea unguium: Secondary | ICD-10-CM

## 2023-01-29 DIAGNOSIS — E1149 Type 2 diabetes mellitus with other diabetic neurological complication: Secondary | ICD-10-CM

## 2023-01-29 DIAGNOSIS — I739 Peripheral vascular disease, unspecified: Secondary | ICD-10-CM

## 2023-01-29 MED ORDER — GABAPENTIN 100 MG PO CAPS: 100.0000 mg | ORAL_CAPSULE | Freq: Every day | ORAL | 0 refills | Status: DC

## 2023-01-29 NOTE — Patient Instructions (Signed)
Gabapentin Capsules or Tablets What is this medication? GABAPENTIN (GA ba pen tin) treats nerve pain. It may also be used to prevent and control seizures in people with epilepsy. It works by calming overactive nerves in your body. This medicine may be used for other purposes; ask your health care provider or pharmacist if you have questions. COMMON BRAND NAME(S): Active-PAC with Gabapentin, Ascencion Dike, Gralise, Neurontin What should I tell my care team before I take this medication? They need to know if you have any of these conditions: Kidney disease Lung or breathing disease Substance use disorder Suicidal thoughts, plans, or attempt by you or a family member An unusual or allergic reaction to gabapentin, other medications, foods, dyes, or preservatives Pregnant or trying to get pregnant Breastfeeding How should I use this medication? Take this medication by mouth with a glass of water. Follow the directions on the prescription label. You can take it with or without food. If it upsets your stomach, take it with food. Take your medication at regular intervals. Do not take it more often than directed. Do not stop taking except on your care team's advice. If you are directed to break the 600 or 800 mg tablets in half as part of your dose, the extra half tablet should be used for the next dose. If you have not used the extra half tablet within 28 days, it should be thrown away. A special MedGuide will be given to you by the pharmacist with each prescription and refill. Be sure to read this information carefully each time. Talk to your care team about the use of this medication in children. While this medication may be prescribed for children as young as 3 years for selected conditions, precautions do apply. Overdosage: If you think you have taken too much of this medicine contact a poison control center or emergency room at once. NOTE: This medicine is only for you. Do not share this medicine with  others. What if I miss a dose? If you miss a dose, take it as soon as you can. If it is almost time for your next dose, take only that dose. Do not take double or extra doses. What may interact with this medication? Alcohol Antihistamines for allergy, cough, and cold Certain medications for anxiety or sleep Certain medications for depression like amitriptyline, fluoxetine, sertraline Certain medications for seizures like phenobarbital, primidone Certain medications for stomach problems General anesthetics like halothane, isoflurane, methoxyflurane, propofol Local anesthetics like lidocaine, pramoxine, tetracaine Medications that relax muscles for surgery Opioid medications for pain Phenothiazines like chlorpromazine, mesoridazine, prochlorperazine, thioridazine This list may not describe all possible interactions. Give your health care provider a list of all the medicines, herbs, non-prescription drugs, or dietary supplements you use. Also tell them if you smoke, drink alcohol, or use illegal drugs. Some items may interact with your medicine. What should I watch for while using this medication? Visit your care team for regular checks on your progress. You may want to keep a record at home of how you feel your condition is responding to treatment. You may want to share this information with your care team at each visit. You should contact your care team if your seizures get worse or if you have any new types of seizures. Do not stop taking this medication or any of your seizure medications unless instructed by your care team. Stopping your medication suddenly can increase your seizures or their severity. This medication may cause serious skin reactions. They can happen weeks to  months after starting the medication. Contact your care team right away if you notice fevers or flu-like symptoms with a rash. The rash may be red or purple and then turn into blisters or peeling of the skin. Or, you might  notice a red rash with swelling of the face, lips or lymph nodes in your neck or under your arms. Wear a medical identification bracelet or chain if you are taking this medication for seizures. Carry a card that lists all your medications. This medication may affect your coordination, reaction time, or judgment. Do not drive or operate machinery until you know how this medication affects you. Sit up or stand slowly to reduce the risk of dizzy or fainting spells. Drinking alcohol with this medication can increase the risk of these side effects. Your mouth may get dry. Chewing sugarless gum or sucking hard candy, and drinking plenty of water may help. Watch for new or worsening thoughts of suicide or depression. This includes sudden changes in mood, behaviors, or thoughts. These changes can happen at any time but are more common in the beginning of treatment or after a change in dose. Call your care team right away if you experience these thoughts or worsening depression. If you become pregnant while using this medication, you may enroll in the [place] American Antiepileptic Drug Pregnancy Registry by calling [phone]. This registry collects information about the safety of antiepileptic medication use during pregnancy. What side effects may I notice from receiving this medication? Side effects that you should report to your care team as soon as possible: Allergic reactions or angioedema--skin rash, itching, hives, swelling of the face, eyes, lips, tongue, arms, or legs, trouble swallowing or breathing Rash, fever, and swollen lymph nodes Thoughts of suicide or self harm, worsening mood, feelings of depression Trouble breathing Unusual changes in mood or behavior in children after use such as difficulty concentrating, hostility, or restlessness Side effects that usually do not require medical attention (report to your care team if they continue or are  bothersome): Dizziness Drowsiness Nausea Swelling of ankles, feet, or hands Vomiting This list may not describe all possible side effects. Call your doctor for medical advice about side effects. You may report side effects to FDA at 1-800-FDA-1088. Where should I keep my medication? Keep out of reach of children and pets. Store at room temperature between 15 and 30 degrees C (59 and 86 degrees F). Get rid of any unused medication after the expiration date. This medication may cause accidental overdose and death if taken by other adults, children, or pets. To get rid of medications that are no longer needed or have expired: Take the medication to a medication take-back program. Check with your pharmacy or law enforcement to find a location. If you cannot return the medication, check the label or package insert to see if the medication should be thrown out in the garbage or flushed down the toilet. If you are not sure, ask your care team. If it is safe to put it in the trash, empty the medication out of the container. Mix the medication with cat litter, dirt, coffee grounds, or other unwanted substance. Seal the mixture in a bag or container. Put it in the trash. NOTE: This sheet is a summary. It may not cover all possible information. If you have questions about this medicine, talk to your doctor, pharmacist, or health care provider.  2024 Elsevier/Gold Standard (2021-12-24 00:00:00)

## 2023-02-03 ENCOUNTER — Other Ambulatory Visit: Payer: Self-pay

## 2023-02-03 DIAGNOSIS — I739 Peripheral vascular disease, unspecified: Secondary | ICD-10-CM

## 2023-02-03 NOTE — Progress Notes (Signed)
Subjective: Chief Complaint  Patient presents with   RFC    RFC-      72 year old female presents the office with above concerns.  States that she still gets some swelling to her ankle but no open lesion that she reports no significant pain.  She is continue to follow with vascular surgery.  She presents today as her nails are thickened elongated her right foot and she cannot trim them herself.  She also reports she is having some nerve symptoms to her foot and pain.   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  Is no open lesions present. Nails of the right are hypertrophic, dystrophic with brown discoloration and several debris present. No pain with calf compression, swelling, warmth, erythema  Assessment: Right foot, ankle swelling; PAD; onychomycosis on anticoagulation; neuropathy  Plan: -All treatment options discussed with the patient including all alternatives, risks, complications.  -She was advised by vascular surgery she states to wear light compression.  Continue with ice, elevation. -Sharply debrided nails x 5 without any complications or bleeding -Discussed neuropathy as well as different medications to help with this.  Will start low-dose gabapentin and titrate up as needed.  Discussed side effects. -Daily foot inspection, glucose control  Return in about 3 months (around 05/01/2023).  Vivi Barrack DPM

## 2023-03-10 ENCOUNTER — Other Ambulatory Visit: Payer: Self-pay | Admitting: Cardiology

## 2023-03-10 DIAGNOSIS — I480 Paroxysmal atrial fibrillation: Secondary | ICD-10-CM

## 2023-03-11 NOTE — Telephone Encounter (Signed)
Prescription refill request for Eliquis received. Indication: AF Last office visit: 09/22/22  Sherlean Foot NP Scr: 0.77 on 11/21/22  Epic Age: 72 Weight: 97.3kg  Based on above findings Eliquis 5mg  twice daily is the appropriate dose.  Refill approved.

## 2023-04-07 ENCOUNTER — Ambulatory Visit (HOSPITAL_COMMUNITY): Payer: Medicare PPO

## 2023-04-07 ENCOUNTER — Ambulatory Visit: Payer: Medicare Other | Admitting: Vascular Surgery

## 2023-05-01 ENCOUNTER — Encounter: Payer: Self-pay | Admitting: Podiatry

## 2023-05-01 ENCOUNTER — Ambulatory Visit (INDEPENDENT_AMBULATORY_CARE_PROVIDER_SITE_OTHER): Payer: Medicare Other | Admitting: Podiatry

## 2023-05-01 DIAGNOSIS — B351 Tinea unguium: Secondary | ICD-10-CM

## 2023-05-01 DIAGNOSIS — E1149 Type 2 diabetes mellitus with other diabetic neurological complication: Secondary | ICD-10-CM

## 2023-05-01 DIAGNOSIS — Z7901 Long term (current) use of anticoagulants: Secondary | ICD-10-CM

## 2023-05-01 NOTE — Progress Notes (Signed)
 Subjective: Chief Complaint  Patient presents with   [facility]    RM#41 DFC     73 year old female presents the office with above concerns.  States that she has been doing well.  She has not noticed any open lesions.  She continues to follow with vascular surgery as well.  Toenails are thickened elongated on the right foot and she cannot remember something because discomfort.    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  There are no open lesions present. Nails of the right are hypertrophic, dystrophic with brown discoloration and several debris present for scar discomfort x 5 on the right foot. No pain with calf compression, swelling, warmth, erythema  Assessment: Right foot, ankle swelling; PAD; onychomycosis on anticoagulation; neuropathy  Plan: -All treatment options discussed with the patient including all alternatives, risks, complications.  -Continue to follow-up with vascular surgery. -Sharply debrided nails x 5 without any complications or bleeding -Daily foot inspection, glucose control  Return in about 3 months (around 07/29/2023).  Donnice JONELLE Fees DPM

## 2023-05-11 ENCOUNTER — Ambulatory Visit (INDEPENDENT_AMBULATORY_CARE_PROVIDER_SITE_OTHER): Payer: 59

## 2023-05-18 ENCOUNTER — Telehealth: Payer: Self-pay

## 2023-05-18 NOTE — Telephone Encounter (Signed)
 Patient called LMVM.  However, VM was cut off and no phone numbers were provided.  Called both numbers in chart, neither phone is an operating number.  Patient has an appointment on 06/23/2023 with Dr. Suszanne Conners.

## 2023-05-21 ENCOUNTER — Telehealth (INDEPENDENT_AMBULATORY_CARE_PROVIDER_SITE_OTHER): Payer: Self-pay | Admitting: Otolaryngology

## 2023-05-21 NOTE — Telephone Encounter (Signed)
 Try to call patient back several times over the past week. No answer or voice mail on  either number. I called home and cell: [phone] and [phone]

## 2023-06-23 ENCOUNTER — Ambulatory Visit (INDEPENDENT_AMBULATORY_CARE_PROVIDER_SITE_OTHER): Payer: 59

## 2023-06-23 ENCOUNTER — Encounter (INDEPENDENT_AMBULATORY_CARE_PROVIDER_SITE_OTHER): Payer: Self-pay

## 2023-06-30 ENCOUNTER — Ambulatory Visit (HOSPITAL_COMMUNITY): Payer: Medicare PPO

## 2023-06-30 ENCOUNTER — Ambulatory Visit: Payer: Medicare Other | Admitting: Vascular Surgery

## 2023-07-30 ENCOUNTER — Ambulatory Visit (INDEPENDENT_AMBULATORY_CARE_PROVIDER_SITE_OTHER): Payer: Medicare Other | Admitting: Podiatry

## 2023-07-30 ENCOUNTER — Encounter: Payer: Self-pay | Admitting: Podiatry

## 2023-07-30 DIAGNOSIS — E1149 Type 2 diabetes mellitus with other diabetic neurological complication: Secondary | ICD-10-CM | POA: Diagnosis not present

## 2023-07-30 DIAGNOSIS — Z7901 Long term (current) use of anticoagulants: Secondary | ICD-10-CM | POA: Diagnosis not present

## 2023-07-30 DIAGNOSIS — B351 Tinea unguium: Secondary | ICD-10-CM | POA: Diagnosis not present

## 2023-07-30 NOTE — Progress Notes (Signed)
 Subjective: Chief Complaint  Patient presents with   [facility]    RM#14 [facility] patient states she is stable doing well.    73 year old female presents the office with above concerns.  States that she has been doing well.  Her nails are thick, discolored on the right foot causing discomfort.  She states that she has swelling to her right foot but she was also up until 230 this morning helping somebody work on a PowerPoint presentation with her feet hanging down.  No open lesions that she reports.  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  There are no open lesions present. Nails of the right are hypertrophic, dystrophic with brown discoloration and several debris present for scar discomfort x 5 on the right foot. No pain with calf compression, swelling, warmth, erythema  Assessment: Right foot, ankle swelling; PAD; onychomycosis on anticoagulation; neuropathy  Plan: -All treatment options discussed with the patient including all alternatives, risks, complications.  -Continue to follow-up with vascular surgery. -Encouraged elevation today to help with the swelling. -Sharply debrided nails x 5 without any complications or bleeding -Daily foot inspection, glucose control  Return in about 3 months (around 10/30/2023).  Charity Conch DPM

## 2023-09-01 ENCOUNTER — Other Ambulatory Visit: Payer: Self-pay

## 2023-09-01 ENCOUNTER — Ambulatory Visit (HOSPITAL_COMMUNITY)
Admission: RE | Admit: 2023-09-01 | Discharge: 2023-09-01 | Disposition: A | Discharge status: Home / Self Care | Source: Ambulatory Visit | Attending: Vascular Surgery | Admitting: Vascular Surgery

## 2023-09-01 ENCOUNTER — Ambulatory Visit: Admitting: Vascular Surgery

## 2023-09-01 ENCOUNTER — Encounter: Payer: Self-pay | Admitting: Vascular Surgery

## 2023-09-01 VITALS — BP 136/68 | HR 80 | Temp 97.2°F | Resp 22 | Ht 67.0 in | Wt 213.3 lb

## 2023-09-01 DIAGNOSIS — I739 Peripheral vascular disease, unspecified: Secondary | ICD-10-CM | POA: Insufficient documentation

## 2023-09-01 LAB — VAS US ABI WITH/WO TBI: Right ABI: 0.83

## 2023-09-01 NOTE — Progress Notes (Signed)
 Patient name: Tammy Good MRN: [id number] DOB: [birth year] Sex: female  REASON FOR CONSULT: 56-month follow-up PAD  HPI: Tammy Good is a 73 y.o. female, with multiple comorbidities that presents for 30-month follow-up of PAD.  Patient has had an extensive history of revascularization.  On 11/15/2020 she had a right SFA above-knee popliteal stent for CLI with a wound.  On 07/11/2021 she had a left SFA AT angioplasty with TP trunk angioplasty and PT angioplasty for CLI with a wound.  Most recently on 11/20/2022 she underwent shockwave lithotripsy of her right popliteal artery distal to the stent.  Has since undergone left BKA.  Now walking with a prosthesis with walker.  She reports no issues with the right leg.  We are watching recurrent right popliteal stenosis.  Past Medical History:  Diagnosis Date   CAD S/P percutaneous coronary angioplasty 08/2013   100% mRCA - PCI Integrity Resolute DES 3.0 mm x 38 mm - 3.35 mm; PTCA of RPA V 2.0 mm x 15 mm   CHF (congestive heart failure) (HCC)    Cholesteatoma    right   Diabetes mellitus type 2 in obese    On insulin  and Invokana   History of osteomyelitis L 5th Toe all 05/2019   s/p Partial Ray Amputation with partial closure; 6 wks Abx & LSFA Atherectomy/DEB PTA with Stent for focal dissection.   Hyperlipidemia with target LDL less than 70    Hypothyroidism (acquired)    Mild essential hypertension    Obesity (BMI 30-39.9) 11/17/2013   PAD (peripheral artery disease) (HCC) 05/26/2019   05/26/19: Abd AoGram- BLE runoff -> L SFA orbital atherectomy - PTA w/ DEB & Stent 6 x 40 Luttonix (for focal dissection) - patent Pop A with 3 V runoff. LEA Dopplers 01/03/2020: RABI (prev) 0.68 (0.53)/ RTBI (prev) 0.58 (0.33); LABI (prev) 0.80 (0.64), LTBI (prev) 0.64 (0.51); R mSFA ~50-74%, L mSFA 50-74%. Patent Prox SFA stent < 49% stenosis   Panhypopituitarism (HCC)    ST elevation myocardial infarction (STEMI) of inferior wall, subsequent episode of  care (HCC) 08/2013   80% branch of D1, 40% mid AV groove circumflex, 100% RCA with subacute thrombus -- thrombus extending into RPA V with 100% occlusion after initial angioplasty of mid RCA ;; Post MI ECHO 6/9/'15: EF 50-55%, mild LVH with moderate HK of inferior wall, Gr1 DD, mild LA dilation; mildly reduced RV function    Past Surgical History:  Procedure Laterality Date   ABDOMINAL AORTOGRAM W/LOWER EXTREMITY N/A 05/26/2019   Procedure: ABDOMINAL AORTOGRAM W/LOWER EXTREMITY;  Surgeon: Young Hensen, MD;  Location: [facility];  Service: Cardiovascular;  Laterality: N/A;   ABDOMINAL AORTOGRAM W/LOWER EXTREMITY N/A 11/15/2020   Procedure: ABDOMINAL AORTOGRAM W/LOWER EXTREMITY;  Surgeon: Young Hensen, MD;  Location: [facility];  Service: Cardiovascular;  Laterality: N/A;   ABDOMINAL AORTOGRAM W/LOWER EXTREMITY N/A 04/18/2021   Procedure: ABDOMINAL AORTOGRAM W/LOWER EXTREMITY;  Surgeon: Young Hensen, MD;  Location: [facility];  Service: Cardiovascular;  Laterality: N/A;   ABDOMINAL AORTOGRAM W/LOWER EXTREMITY Left 07/11/2021   Procedure: ABDOMINAL AORTOGRAM W/LOWER EXTREMITY;  Surgeon: Young Hensen, MD;  [facility]: [facility];  Service: Cardiovascular;  Laterality: Left;   ABDOMINAL AORTOGRAM W/LOWER EXTREMITY N/A 11/20/2022   Procedure: ABDOMINAL AORTOGRAM W/LOWER EXTREMITY;  Surgeon: Young Hensen, MD;  [facility]: [facility];  Service: Cardiovascular;  Laterality: N/A;   AMPUTATION Left 05/25/2019   Procedure: AMPUTATION RAY  5th;  Surgeon: Charity Conch, DPM;  [facility] OR;  Service: Podiatry;  Laterality: Left;   AMPUTATION Left 01/14/2022   Procedure: LEFT BELOW THE KNEE AMPUTATION;  Surgeon: Donnamarie Gables, MD;  Location: [facility] OR;  Service: Orthopedics;  Laterality: Left;  LENGTH OF SURGERY: 90 MINUTES   APPLICATION OF WOUND VAC Left 01/14/2022   Procedure: WOUND VAC PLACEMENT;  Surgeon: Donnamarie Gables, MD;  Location: [facility] OR;  Service: Orthopedics;  Laterality: Left;   BONE BIOPSY Left 04/24/2021   Procedure: BONE BIOPSY;  Surgeon: Lizzie Riis, DPM;  Location: [facility] OR;  Service: Podiatry;  Laterality: Left;  left ankle/fibula   BONE BIOPSY Left 07/15/2021   Procedure: LEFT FOOT BONE BIOPSY;  Surgeon: Charity Conch, DPM;  Location: [facility] OR;  Service: Podiatry;  Laterality: Left;   Cardiac Event Monitor  July-August 2015   Sinus rhythm with PVCs   CHOLECYSTECTOMY     COLONOSCOPY N/A 08/31/2013   Procedure: COLONOSCOPY;  Surgeon: Tami Falcon, MD;  Location: [facility] ENDOSCOPY;  Service: Endoscopy;  Laterality: N/A;   CORONARY STENT INTERVENTION N/A 11/19/2020   Procedure: CORONARY STENT INTERVENTION;  Surgeon: Sammy Crisp, MD;  [facility];  Service: Cardiovascular;  Laterality: N/A;   ESOPHAGOGASTRODUODENOSCOPY N/A 09/01/2013   Procedure: ESOPHAGOGASTRODUODENOSCOPY (EGD);  Surgeon: Almeda Aris, MD;  Location: [facility] ENDOSCOPY;  Service: Endoscopy;  Laterality: N/A;  bedside   ESOPHAGOGASTRODUODENOSCOPY (EGD) WITH PROPOFOL  N/A 05/26/2021   Procedure: ESOPHAGOGASTRODUODENOSCOPY (EGD) WITH PROPOFOL ;  Surgeon: Alvis Jourdain, MD;  Location: [facility] ENDOSCOPY;  Service: Gastroenterology;  Laterality: N/A;   EYE SURGERY Bilateral    bilateral cataracts   INCISION AND DRAINAGE Left 04/24/2021   Procedure: INCISION AND DRAINAGE;  Surgeon: Lizzie Riis, DPM;  Location: [facility] OR;  Service: Podiatry;  Laterality: Left;   IRRIGATION AND DEBRIDEMENT FOOT Left 07/15/2021   Procedure: IRRIGATION AND DEBRIDEMENT FOOT;  Surgeon: Charity Conch, DPM;  Location: [facility] OR;  Service: Podiatry;  Laterality: Left;   LEFT HEART CATH AND CORONARY ANGIOGRAPHY N/A 11/19/2020   Procedure: LEFT HEART CATH AND CORONARY ANGIOGRAPHY;  Surgeon: Sammy Crisp, MD;  [facility];  Service: Cardiovascular;  Laterality: N/A;   LEFT HEART CATHETERIZATION WITH CORONARY ANGIOGRAM N/A 08/30/2013    Procedure: LEFT HEART CATHETERIZATION WITH CORONARY ANGIOGRAM;  Surgeon: Arleen Lacer, MD;  Location: [facility] CATH LAB: 100% mRCA (thrombus - extends to RPAV), 80% D1, 40% AVG Cx.   PERCUTANEOUS CORONARY STENT INTERVENTION (PCI-S)  08/30/2013   Procedure: PERCUTANEOUS CORONARY STENT INTERVENTION (PCI-S);  Surgeon: Arleen Lacer, MD;  Location: [facility] CATH LAB;  Integrity Resolute DES 2.0 mm x 38 mm -- 3.35 mm.; PTCA of proximal RPA V. - 3.0 mm x 15 mm balloon   PERIPHERAL INTRAVASCULAR LITHOTRIPSY Right 11/20/2022   Procedure: PERIPHERAL INTRAVASCULAR LITHOTRIPSY;  Surgeon: Young Hensen, MD;  [facility];  Service: Cardiovascular;  Laterality: Right;  POP   PERIPHERAL VASCULAR ATHERECTOMY  05/26/2019   Procedure: PERIPHERAL VASCULAR ATHERECTOMY;  Surgeon: Young Hensen, MD;  [facility];  Service: Cardiovascular;;  Left SFA   PERIPHERAL VASCULAR BALLOON ANGIOPLASTY Left 07/11/2021   Procedure: PERIPHERAL VASCULAR BALLOON ANGIOPLASTY;  Surgeon: Young Hensen, MD;  [facility];  Service: Cardiovascular;  Laterality: Left;  PT TRUNK / AT   PERIPHERAL VASCULAR BALLOON ANGIOPLASTY Right 11/20/2022   Procedure: PERIPHERAL VASCULAR BALLOON ANGIOPLASTY;  Surgeon: Young Hensen, MD;  Location: [facility] INVASIVE CV  LAB;  Service: Cardiovascular;  Laterality: Right;  POP   PERIPHERAL VASCULAR INTERVENTION  05/26/2019   Procedure: PERIPHERAL VASCULAR INTERVENTION;  Surgeon: Young Hensen, MD;  [facility];  Service: Cardiovascular;;  Left SFA   PERIPHERAL VASCULAR INTERVENTION Right 11/15/2020   Procedure: PERIPHERAL VASCULAR INTERVENTION;  Surgeon: Young Hensen, MD;  [facility];  Service: Cardiovascular;  Laterality: Right;  Superficial Femoral Artery   PERIPHERAL VASCULAR INTERVENTION Left 07/11/2021   Procedure: PERIPHERAL VASCULAR INTERVENTION;  Surgeon: Young Hensen, MD;  [facility]:  [facility];  Service: Cardiovascular;  Laterality: Left;  SFA   PITUITARY SURGERY     TEE WITHOUT CARDIOVERSION N/A 04/26/2021   Procedure: TRANSESOPHAGEAL ECHOCARDIOGRAM (TEE);  Surgeon: Sheryle Donning, MD;  Location: [facility] ENDOSCOPY;  Service: Cardiovascular;  Laterality: N/A;   TRANSTHORACIC ECHOCARDIOGRAM  08/30/2013   mild LVH. EF 50-55%. Moderate HK of the entire inferior myocardium. GR 1 DD. Mild LA dilation. Mildly reduced RV function   TYMPANOMASTOIDECTOMY Right 12/28/2017   Procedure: RIGHT TYMPANOMASTOIDECTOMY;  Surgeon: Reynold Caves, MD;  Location: [facility];  Service: ENT;  Laterality: Right;    Family History  Problem Relation Age of Onset   Cancer Mother 18       multiple myeloma   Heart attack Father 44   Cancer Sister    Alzheimer's disease Maternal Grandmother     SOCIAL HISTORY: Social History   Socioeconomic History   Marital status: Widowed    Spouse name: Not on file   Number of children: 1   Years of education: Not on file   Highest education level: Not on file  Occupational History   Not on file  Tobacco Use   Smoking status: Former    Passive exposure: Never   Smokeless tobacco: Never  Vaping Use   Vaping status: Never Used  Substance and Sexual Activity   Alcohol  use: No   Drug use: No   Sexual activity: Not Currently    Birth control/protection: Post-menopausal  Other Topics Concern   Not on file  Social History Narrative   Widow. Works at [employer].   Former smoker.   Overall not very active.  Hoping to get into water aerobics class.   Social Drivers of Corporate investment banker Strain: Not on file  Food Insecurity: Not on file  Transportation Needs: Not on file  Physical Activity: Not on file  Stress: Not on file  Social Connections: Unknown (08/05/2021)   [facility]   Social Network    Social Network: Not on file  Intimate Partner Violence: Unknown (06/26/2021)    Received from [facility]   HITS    Physically Hurt: Not on file    Insult or Talk Down To: Not on file    Threaten Physical Harm: Not on file    Scream or Curse: Not on file    Allergies  Allergen Reactions   Strawberry Extract Itching, Swelling and Anaphylaxis    Mouth swells and gets itchy    Current Outpatient Medications  Medication Sig Dispense Refill   apixaban  (ELIQUIS ) 5 MG TABS tablet TAKE 1 TABLET BY MOUTH TWICE A DAY 60 tablet 5   atorvastatin  (LIPITOR) 40 MG tablet Take 1 tablet (40 mg total) by mouth daily at 6 PM. 30 tablet 0   B-D UF III MINI PEN NEEDLES 31G X 5 MM MISC Inject into the skin.  clopidogrel  (PLAVIX ) 75 MG tablet Take 1 tablet (75 mg total) by mouth daily. 90 tablet 3   Continuous Blood Gluc Sensor (FREESTYLE LIBRE 2 SENSOR) MISC Apply topically every 14 (fourteen) days.     cycloSPORINE (RESTASIS) 0.05 % ophthalmic emulsion Place 1 drop into both eyes 2 (two) times daily as needed (dry eyes).     dapagliflozin  propanediol (FARXIGA ) 10 MG TABS tablet Take 10 mg by mouth daily.     diclofenac  Sodium (VOLTAREN ) 1 % GEL Apply 4 g topically 4 (four) times daily. (Patient taking differently: Apply 4 g topically 4 (four) times daily as needed (knee pain).) 400 g 0   ferrous sulfate  325 (65 FE) MG tablet Take 325 mg by mouth at bedtime.     gabapentin  (NEURONTIN ) 100 MG capsule Take 1 capsule (100 mg total) by mouth at bedtime. 90 capsule 0   insulin  aspart (NOVOLOG  FLEXPEN) 100 UNIT/ML FlexPen Inject 2-10 Units into the skin 3 (three) times daily with meals. Sliding scale insulin      Insulin  Glargine (BASAGLAR  KWIKPEN) 100 UNIT/ML Inject 24 Units into the skin at bedtime. (Patient taking differently: Inject 18 Units into the skin at bedtime.)     levothyroxine  (SYNTHROID ) 125 MCG tablet Take 1 tablet (125 mcg total) by mouth daily before breakfast. 30 tablet 2   midodrine  (PROAMATINE ) 5 MG tablet Take 1 tablet (5 mg total) by mouth 2 (two) times  daily with breakfast and lunch. (Patient taking differently: Take 5 mg by mouth 2 (two) times daily with a meal. Lunch and Dinner)     mupirocin  ointment (BACTROBAN ) 2 % Apply 1 Application topically 2 (two) times daily. 30 g 2   nitroGLYCERIN  (NITROSTAT ) 0.4 MG SL tablet Place 0.4 mg under the tongue every 5 (five) minutes as needed for chest pain.     oxybutynin  (DITROPAN -XL) 10 MG 24 hr tablet Take 10 mg by mouth daily after lunch.     pantoprazole  (PROTONIX ) 40 MG tablet Take 40 mg by mouth daily.     polyethylene glycol powder (GLYCOLAX /MIRALAX ) 17 GM/SCOOP powder Take 1 capful with water (17 g) by mouth daily. (Patient taking differently: Take 17 g by mouth daily as needed (constipation.).) 238 g 0   Propylene Glycol (SYSTANE COMPLETE) 0.6 % SOLN Place 1 drop into both eyes 2 (two) times daily as needed (dry/irritated eyes.).     trimethoprim  (TRIMPEX ) 100 MG tablet Take 100 mg by mouth at bedtime.     No current facility-administered medications for this visit.    REVIEW OF SYSTEMS:  [X]  denotes positive finding, [ ]  denotes negative finding Cardiac  Comments:  Chest pain or chest pressure:    Shortness of breath upon exertion:    Short of breath when lying flat:    Irregular heart rhythm:        Vascular    Pain in calf, thigh, or hip brought on by ambulation:    Pain in feet at night that wakes you up from your sleep:     Blood clot in your veins:    Leg swelling:         Pulmonary    Oxygen at home:    Productive cough:     Wheezing:         Neurologic    Sudden weakness in arms or legs:     Sudden numbness in arms or legs:     Sudden onset of difficulty speaking or slurred speech:    Temporary loss of vision  in one eye:     Problems with dizziness:         Gastrointestinal    Blood in stool:     Vomited blood:         Genitourinary    Burning when urinating:     Blood in urine:        Psychiatric    Major depression:         Hematologic    Bleeding  problems:    Problems with blood clotting too easily:        Skin    Rashes or ulcers:        Constitutional    Fever or chills:      PHYSICAL EXAM: There were no vitals filed for this visit.  GENERAL: The patient is a well-nourished female, in no acute distress. The vital signs are documented above. CARDIAC: There is a regular rate and rhythm.  VASCULAR:  Bilateral femoral pulses palpable Left BKA No palpable right pedal pulses but no tissue loss PULMONARY: No respiratory distress ABDOMEN: Soft and non-tender. MUSCULOSKELETAL: There are no major deformities or cyanosis. NEUROLOGIC: No focal weakness or paresthesias are detected. SKIN: There are no ulcers or rashes noted. PSYCHIATRIC: The patient has a normal affect.  DATA:   ABIs today are 0.83 on the right multiphasic and she has a BKA on the left  Right lower extremity arterial duplex shows a popliteal velocity of 453 suggesting a high-grade greater than 75% stenosis (this is stable from a velocity of 452 on 01/13/2023)  Assessment/Plan:  73 y.o. female, with multiple comorbidities that presents for 38-month follow-up of PAD.  Patient has had an extensive history of revascularization.  On 11/15/2020 she had a right SFA above-knee popliteal stent for CLI with a wound.  Most recently on 11/20/2022 she underwent shockwave lithotripsy of her right popliteal artery distal to the stent.  We are watching a recurrent right popliteal stenosis.  Still has a significantly elevated velocity of 453 in the right popliteal artery.  I discussed this suggest an ongoing flow-limiting stenosis.  I have recommended returning to the Cath Lab for right leg angiogram possible right popliteal intervention including lithotripsy if needed.  Discussed this is to maintain patency of her right SFA pop stents.  Will get scheduled in the Cath Lab at her convenience.  Will need to hold Eliquis  for the procedure and she remains on Eliquis  Plavix .  Risk benefits  discussed.     Young Hensen, MD [facility] Office: [phone]

## 2023-09-10 ENCOUNTER — Encounter (HOSPITAL_COMMUNITY): Payer: Self-pay | Admitting: Vascular Surgery

## 2023-09-10 ENCOUNTER — Encounter (HOSPITAL_COMMUNITY)
Admission: RE | Disposition: A | Discharge status: Home / Self Care | Payer: Self-pay | Source: Ambulatory Visit | Attending: Vascular Surgery

## 2023-09-10 ENCOUNTER — Other Ambulatory Visit: Payer: Self-pay

## 2023-09-10 ENCOUNTER — Ambulatory Visit (HOSPITAL_COMMUNITY)
Admission: RE | Admit: 2023-09-10 | Discharge: 2023-09-10 | Disposition: A | Discharge status: Home / Self Care | Source: Ambulatory Visit | Attending: Vascular Surgery | Admitting: Vascular Surgery

## 2023-09-10 DIAGNOSIS — Z794 Long term (current) use of insulin: Secondary | ICD-10-CM | POA: Diagnosis not present

## 2023-09-10 DIAGNOSIS — E1151 Type 2 diabetes mellitus with diabetic peripheral angiopathy without gangrene: Secondary | ICD-10-CM | POA: Diagnosis present

## 2023-09-10 DIAGNOSIS — Z89512 Acquired absence of left leg below knee: Secondary | ICD-10-CM | POA: Diagnosis not present

## 2023-09-10 DIAGNOSIS — I70221 Atherosclerosis of native arteries of extremities with rest pain, right leg: Secondary | ICD-10-CM | POA: Diagnosis not present

## 2023-09-10 DIAGNOSIS — M7989 Other specified soft tissue disorders: Secondary | ICD-10-CM

## 2023-09-10 DIAGNOSIS — Z87891 Personal history of nicotine dependence: Secondary | ICD-10-CM | POA: Insufficient documentation

## 2023-09-10 DIAGNOSIS — Z95820 Peripheral vascular angioplasty status with implants and grafts: Secondary | ICD-10-CM

## 2023-09-10 DIAGNOSIS — Y831 Surgical operation with implant of artificial internal device as the cause of abnormal reaction of the patient, or of later complication, without mention of misadventure at the time of the procedure: Secondary | ICD-10-CM | POA: Diagnosis not present

## 2023-09-10 DIAGNOSIS — I70291 Other atherosclerosis of native arteries of extremities, right leg: Secondary | ICD-10-CM | POA: Diagnosis not present

## 2023-09-10 DIAGNOSIS — I739 Peripheral vascular disease, unspecified: Secondary | ICD-10-CM

## 2023-09-10 DIAGNOSIS — T82858A Stenosis of vascular prosthetic devices, implants and grafts, initial encounter: Secondary | ICD-10-CM | POA: Insufficient documentation

## 2023-09-10 DIAGNOSIS — Z7984 Long term (current) use of oral hypoglycemic drugs: Secondary | ICD-10-CM | POA: Diagnosis not present

## 2023-09-10 HISTORY — PX: ABDOMINAL AORTOGRAM: CATH118222

## 2023-09-10 HISTORY — PX: LOWER EXTREMITY INTERVENTION: CATH118252

## 2023-09-10 HISTORY — PX: LOWER EXTREMITY ANGIOGRAPHY: CATH118251

## 2023-09-10 LAB — POCT I-STAT, CHEM 8
BUN: 14 mg/dL (ref 8–23)
Calcium, Ion: 1.2 mmol/L (ref 1.15–1.40)
Chloride: 105 mmol/L (ref 98–111)
Creatinine, Ser: 0.8 mg/dL (ref 0.44–1.00)
Glucose, Bld: 142 mg/dL — ABNORMAL HIGH (ref 70–99)
HCT: 29 % — ABNORMAL LOW (ref 36.0–46.0)
Hemoglobin: 9.9 g/dL — ABNORMAL LOW (ref 12.0–15.0)
Potassium: 3.5 mmol/L (ref 3.5–5.1)
Sodium: 144 mmol/L (ref 135–145)
TCO2: 25 mmol/L (ref 22–32)

## 2023-09-10 LAB — GLUCOSE, CAPILLARY
Glucose-Capillary: 79 mg/dL (ref 70–99)
Glucose-Capillary: 90 mg/dL (ref 70–99)

## 2023-09-10 LAB — POCT ACTIVATED CLOTTING TIME
Activated Clotting Time: 216 s
Activated Clotting Time: 176 s

## 2023-09-10 SURGERY — ABDOMINAL AORTOGRAM
Anesthesia: LOCAL

## 2023-09-10 MED ORDER — SODIUM CHLORIDE 0.9 % IV SOLN: INTRAVENOUS | Status: DC

## 2023-09-10 MED ORDER — LABETALOL HCL 5 MG/ML IV SOLN: 10.0000 mg | INTRAVENOUS | Status: DC | PRN

## 2023-09-10 MED ORDER — HYDRALAZINE HCL 20 MG/ML IJ SOLN: 5.0000 mg | INTRAMUSCULAR | Status: DC | PRN

## 2023-09-10 MED ORDER — CLOPIDOGREL BISULFATE 300 MG PO TABS
ORAL_TABLET | ORAL | Status: DC | PRN
  Administered 2023-09-10: 75 mg via ORAL

## 2023-09-10 MED ORDER — FENTANYL CITRATE (PF) 100 MCG/2ML IJ SOLN
INTRAMUSCULAR | Status: AC
Start: 2023-09-10 — End: 2023-09-10
  Filled 2023-09-10: qty 2

## 2023-09-10 MED ORDER — MIDAZOLAM HCL 2 MG/2ML IJ SOLN
INTRAMUSCULAR | Status: AC
  Filled 2023-09-10: qty 2

## 2023-09-10 MED ORDER — ACETAMINOPHEN 325 MG PO TABS: 650.0000 mg | ORAL_TABLET | ORAL | Status: DC | PRN

## 2023-09-10 MED ORDER — CLOPIDOGREL BISULFATE 75 MG PO TABS
ORAL_TABLET | ORAL | Status: AC
  Filled 2023-09-10: qty 1

## 2023-09-10 MED ORDER — FENTANYL CITRATE (PF) 100 MCG/2ML IJ SOLN
INTRAMUSCULAR | Status: DC | PRN
  Administered 2023-09-10: 25 ug via INTRAVENOUS

## 2023-09-10 MED ORDER — MIDAZOLAM HCL 2 MG/2ML IJ SOLN
INTRAMUSCULAR | Status: DC | PRN
  Administered 2023-09-10: 1 mg via INTRAVENOUS

## 2023-09-10 MED ORDER — HEPARIN SODIUM (PORCINE) 1000 UNIT/ML IJ SOLN
INTRAMUSCULAR | Status: AC
  Filled 2023-09-10: qty 10

## 2023-09-10 MED ORDER — IODIXANOL 320 MG/ML IV SOLN
INTRAVENOUS | Status: DC | PRN
  Administered 2023-09-10: 80 mL

## 2023-09-10 MED ORDER — SODIUM CHLORIDE 0.9% FLUSH: 3.0000 mL | INTRAVENOUS | Status: DC | PRN

## 2023-09-10 MED ORDER — SODIUM CHLORIDE 0.9 % IV SOLN
INTRAVENOUS | Status: AC
Start: 2023-09-10 — End: 2023-09-10

## 2023-09-10 MED ORDER — CLOPIDOGREL BISULFATE 75 MG PO TABS: 75.0000 mg | ORAL_TABLET | Freq: Every day | ORAL | Status: DC

## 2023-09-10 MED ORDER — LIDOCAINE HCL (PF) 1 % IJ SOLN
INTRAMUSCULAR | Status: AC
  Filled 2023-09-10: qty 30

## 2023-09-10 MED ORDER — HEPARIN (PORCINE) IN NACL 1000-0.9 UT/500ML-% IV SOLN
INTRAVENOUS | Status: DC | PRN
  Administered 2023-09-10 (×2): 500 mL

## 2023-09-10 MED ORDER — OXYCODONE HCL 5 MG PO TABS: 5.0000 mg | ORAL_TABLET | ORAL | Status: DC | PRN

## 2023-09-10 MED ORDER — ONDANSETRON HCL 4 MG/2ML IJ SOLN: 4.0000 mg | Freq: Four times a day (QID) | INTRAMUSCULAR | Status: DC | PRN

## 2023-09-10 MED ORDER — HEPARIN SODIUM (PORCINE) 1000 UNIT/ML IJ SOLN
INTRAMUSCULAR | Status: DC | PRN
  Administered 2023-09-10: 10000 [IU] via INTRAVENOUS

## 2023-09-10 MED ORDER — SODIUM CHLORIDE 0.9% FLUSH: 3.0000 mL | Freq: Two times a day (BID) | INTRAVENOUS | Status: DC

## 2023-09-10 MED ORDER — SODIUM CHLORIDE 0.9 % IV SOLN: 250.0000 mL | INTRAVENOUS | Status: DC | PRN

## 2023-09-10 MED ORDER — LIDOCAINE HCL (PF) 1 % IJ SOLN
INTRAMUSCULAR | Status: DC | PRN
  Administered 2023-09-10: 15 mL via INTRADERMAL

## 2023-09-10 SURGICAL SUPPLY — 17 items
CATH BEACON 5 .035 65 KMP TIP (CATHETERS) IMPLANT
CATH OMNI FLUSH 5F 65CM (CATHETERS) IMPLANT
CATH SHOCKWAVE M5 4.5X60 (CATHETERS) IMPLANT
COVER DOME SNAP 22 D (MISCELLANEOUS) IMPLANT
DCB RANGER 5.0X80 135 (BALLOONS) IMPLANT
DEVICE TORQUE SEADRAGON GRN (MISCELLANEOUS) IMPLANT
GUIDEWIRE ANGLED .035X260CM (WIRE) IMPLANT
KIT ENCORE 26 ADVANTAGE (KITS) IMPLANT
KIT MICROPUNCTURE NIT STIFF (SHEATH) IMPLANT
KIT SYRINGE INJ CVI SPIKEX1 (MISCELLANEOUS) IMPLANT
SET ATX-X65L (MISCELLANEOUS) IMPLANT
SHEATH CATAPULT 5F 45 MP (SHEATH) IMPLANT
SHEATH PINNACLE 5F 10CM (SHEATH) IMPLANT
SHEATH PROBE COVER 6X72 (BAG) IMPLANT
TRAY PV CATH (CUSTOM PROCEDURE TRAY) ×2 IMPLANT
WIRE BENTSON .035X145CM (WIRE) IMPLANT
WIRE SPARTACORE .014X300CM (WIRE) IMPLANT

## 2023-09-10 NOTE — Discharge Instructions (Addendum)
 Resume Eliquis  on post-procedure day 1.  Will arrange follow-up in one month.  Plavix  and statin as well.

## 2023-09-10 NOTE — H&P (Signed)
 History and Physical Interval Note:  09/10/2023 7:50 AM  Tammy Good  has presented today for surgery, with the diagnosis of PAD.  The various methods of treatment have been discussed with the patient and family. After consideration of risks, benefits and other options for treatment, the patient has consented to  Procedure(s): ABDOMINAL AORTOGRAM (N/A) Lower Extremity Angiography (N/A) LOWER EXTREMITY INTERVENTION (N/A) as a surgical intervention.  The patient's history has been reviewed, patient examined, no change in status, stable for surgery.  I have reviewed the patient's chart and labs.  Questions were answered to the patient's satisfaction.    Evaluate right popliteal stenosis.  Young Hensen     Patient name: Tammy Good   MRN: [id number]        DOB: 06-21-1950            Sex: female   REASON FOR CONSULT: 77-month follow-up PAD   HPI: Tammy Good is a 73 y.o. female, with multiple comorbidities that presents for 92-month follow-up of PAD.  Patient has had an extensive history of revascularization.  On 11/15/2020 she had a right SFA above-knee popliteal stent for CLI with a wound.  On 07/11/2021 she had a left SFA AT angioplasty with TP trunk angioplasty and PT angioplasty for CLI with a wound.  Most recently on 11/20/2022 she underwent shockwave lithotripsy of her right popliteal artery distal to the stent.   Has since undergone left BKA.  Now walking with a prosthesis with walker.  She reports no issues with the right leg.  We are watching recurrent right popliteal stenosis.       Past Medical History:  Diagnosis Date   CAD S/P percutaneous coronary angioplasty 08/2013    100% mRCA - PCI Integrity Resolute DES 3.0 mm x 38 mm - 3.35 mm; PTCA of RPA V 2.0 mm x 15 mm   CHF (congestive heart failure) (HCC)     Cholesteatoma      right   Diabetes mellitus type 2 in obese      On insulin  and Invokana   History of osteomyelitis L 5th Toe all 05/2019    s/p Partial  Ray Amputation with partial closure; 6 wks Abx & LSFA Atherectomy/DEB PTA with Stent for focal dissection.   Hyperlipidemia with target LDL less than 70     Hypothyroidism (acquired)     Mild essential hypertension     Obesity (BMI 30-39.9) 11/17/2013   PAD (peripheral artery disease) (HCC) 05/26/2019    05/26/19: Abd AoGram- BLE runoff -> L SFA orbital atherectomy - PTA w/ DEB & Stent 6 x 40 Luttonix (for focal dissection) - patent Pop A with 3 V runoff. LEA Dopplers 01/03/2020: RABI (prev) 0.68 (0.53)/ RTBI (prev) 0.58 (0.33); LABI (prev) 0.80 (0.64), LTBI (prev) 0.64 (0.51); R mSFA ~50-74%, L mSFA 50-74%. Patent Prox SFA stent < 49% stenosis   Panhypopituitarism (HCC)     ST elevation myocardial infarction (STEMI) of inferior wall, subsequent episode of care (HCC) 08/2013    80% branch of D1, 40% mid AV groove circumflex, 100% RCA with subacute thrombus -- thrombus extending into RPA V with 100% occlusion after initial angioplasty of mid RCA ;; Post MI ECHO 6/9/'15: EF 50-55%, mild LVH with moderate HK of inferior wall, Gr1 DD, mild LA dilation; mildly reduced RV function               Past Surgical History:  Procedure Laterality Date   ABDOMINAL AORTOGRAM W/LOWER EXTREMITY N/A  05/26/2019    Procedure: ABDOMINAL AORTOGRAM W/LOWER EXTREMITY;  Surgeon: Young Hensen, MD;  Location: [facility] INVASIVE CV LAB;  Service: Cardiovascular;  Laterality: N/A;   ABDOMINAL AORTOGRAM W/LOWER EXTREMITY N/A 11/15/2020    Procedure: ABDOMINAL AORTOGRAM W/LOWER EXTREMITY;  Surgeon: Young Hensen, MD;  Location: [facility] INVASIVE CV LAB;  Service: Cardiovascular;  Laterality: N/A;   ABDOMINAL AORTOGRAM W/LOWER EXTREMITY N/A 04/18/2021    Procedure: ABDOMINAL AORTOGRAM W/LOWER EXTREMITY;  Surgeon: Young Hensen, MD;  Location: [facility] INVASIVE CV LAB;  Service: Cardiovascular;  Laterality: N/A;   ABDOMINAL AORTOGRAM W/LOWER EXTREMITY Left 07/11/2021    Procedure: ABDOMINAL AORTOGRAM W/LOWER EXTREMITY;   Surgeon: Young Hensen, MD;  Location: [facility] INVASIVE CV LAB;  Service: Cardiovascular;  Laterality: Left;   ABDOMINAL AORTOGRAM W/LOWER EXTREMITY N/A 11/20/2022    Procedure: ABDOMINAL AORTOGRAM W/LOWER EXTREMITY;  Surgeon: Young Hensen, MD;  Location: [facility] INVASIVE CV LAB;  Service: Cardiovascular;  Laterality: N/A;   AMPUTATION Left 05/25/2019    Procedure: AMPUTATION RAY 5th;  Surgeon: Charity Conch, DPM;  Location: [facility] OR;  Service: Podiatry;  Laterality: Left;   AMPUTATION Left 01/14/2022    Procedure: LEFT BELOW THE KNEE AMPUTATION;  Surgeon: Donnamarie Gables, MD;  Location: [facility] OR;  Service: Orthopedics;  Laterality: Left;  LENGTH OF SURGERY: 90 MINUTES   APPLICATION OF WOUND VAC Left 01/14/2022    Procedure: WOUND VAC PLACEMENT;  Surgeon: Donnamarie Gables, MD;  Location: [facility] OR;  Service: Orthopedics;  Laterality: Left;   BONE BIOPSY Left 04/24/2021    Procedure: BONE BIOPSY;  Surgeon: Lizzie Riis, DPM;  Location: [facility] OR;  Service: Podiatry;  Laterality: Left;  left ankle/fibula   BONE BIOPSY Left 07/15/2021    Procedure: LEFT FOOT BONE BIOPSY;  Surgeon: Charity Conch, DPM;  Location: [facility] OR;  Service: Podiatry;  Laterality: Left;   Cardiac Event Monitor   July-August 2015    Sinus rhythm with PVCs   CHOLECYSTECTOMY       COLONOSCOPY N/A 08/31/2013    Procedure: COLONOSCOPY;  Surgeon: Tami Falcon, MD;  Location: [facility] ENDOSCOPY;  Service: Endoscopy;  Laterality: N/A;   CORONARY STENT INTERVENTION N/A 11/19/2020    Procedure: CORONARY STENT INTERVENTION;  Surgeon: Sammy Crisp, MD;  [facility]: [facility] INVASIVE CV LAB;  Service: Cardiovascular;  Laterality: N/A;   ESOPHAGOGASTRODUODENOSCOPY N/A 09/01/2013    Procedure: ESOPHAGOGASTRODUODENOSCOPY (EGD);  Surgeon: Almeda Aris, MD;  Location: [facility] ENDOSCOPY;  Service: Endoscopy;  Laterality: N/A;  bedside   ESOPHAGOGASTRODUODENOSCOPY (EGD) WITH PROPOFOL  N/A 05/26/2021    Procedure: ESOPHAGOGASTRODUODENOSCOPY (EGD)  WITH PROPOFOL ;  Surgeon: Alvis Jourdain, MD;  Location: [facility] ENDOSCOPY;  Service: Gastroenterology;  Laterality: N/A;   EYE SURGERY Bilateral      bilateral cataracts   INCISION AND DRAINAGE Left 04/24/2021    Procedure: INCISION AND DRAINAGE;  Surgeon: Lizzie Riis, DPM;  Location: [facility] OR;  Service: Podiatry;  Laterality: Left;   IRRIGATION AND DEBRIDEMENT FOOT Left 07/15/2021    Procedure: IRRIGATION AND DEBRIDEMENT FOOT;  Surgeon: Charity Conch, DPM;  Location: [facility] OR;  Service: Podiatry;  Laterality: Left;   LEFT HEART CATH AND CORONARY ANGIOGRAPHY N/A 11/19/2020    Procedure: LEFT HEART CATH AND CORONARY ANGIOGRAPHY;  Surgeon: Sammy Crisp, MD;  [facility]: [facility] INVAS[facility] CV LAB;  Service: Cardiovascular;  Laterality: N/A;   LEFT HEART CATHETERIZATION WITH CORONARY ANGIOGRAM N/A 08/30/2013    Procedure: LEFT HEART CATHETERIZATION WITH CORONARY ANGIOGRAM;  Surgeon: Arleen Lacer, MD;  Location: [facility] CATH LAB:  100% mRCA (thrombus - extends to RPAV), 80% D1, 40% AVG Cx.   PERCUTANEOUS CORONARY STENT INTERVENTION (PCI-S)   08/30/2013    Procedure: PERCUTANEOUS CORONARY STENT INTERVENTION (PCI-S);  Surgeon: Arleen Lacer, MD;  Location: [facility] CATH LAB;  Integrity Resolute DES 2.0 mm x 38 mm -- 3.35 mm.; PTCA of proximal RPA V. - 3.0 mm x 15 mm balloon   PERIPHERAL INTRAVASCULAR LITHOTRIPSY Right 11/20/2022    Procedure: PERIPHERAL INTRAVASCULAR LITHOTRIPSY;  Surgeon: Young Hensen, MD;  [facility]: [facility] INVASIVE CV LAB;  Service: Cardiovascular;  Laterality: Right;  POP   PERIPHERAL VASCULAR ATHERECTOMY   05/26/2019    Procedure: PERIPHERAL VASCULAR ATHERECTOMY;  Surgeon: Young Hensen, MD;  [facility]: [facility] INVASIVE CV LAB;  Service: Cardiovascular;;  Left SFA   PERIPHERAL VASCULAR BALLOON ANGIOPLASTY Left 07/11/2021    Procedure: PERIPHERAL VASCULAR BALLOON ANGIOPLASTY;  Surgeon: Young Hensen, MD;  [facility] INVAS[facility] CV LAB;  Service: Cardiovascular;  Laterality: Left;  PT TRUNK  / AT   PERIPHERAL VASCULAR BALLOON ANGIOPLASTY Right 11/20/2022    Procedure: PERIPHERAL VASCULAR BALLOON ANGIOPLASTY;  Surgeon: Young Hensen, MD;  Location: [facility]VASIVE CV LAB;  Service: Cardiovascular;  Laterality: Right;  POP   PERIPHERAL VASCULAR INTERVENTION   05/26/2019    Procedure: PERIPHERAL VASCULAR INTERVENTION;  Surgeon: Young Hensen, MD;  [facility]: [facility]VAS[facility] CV LAB;  Service: Cardiovascular;;  Left SFA   PERIPHERAL VASCULAR INTERVENTION Right 11/15/2020    Procedure: PERIPHERAL VASCULAR INTERVENTION;  Surgeon: Young Hensen, MD;  [facility]: [facility] INVASIVE CV LAB;  Service: Cardiovascular;  Laterality: Right;  Superficial Femoral Artery   PERIPHERAL VASCULAR INTERVENTION Left 07/11/2021    Procedure: PERIPHERAL VASCULAR INTERVENTION;  Surgeon: Young Hensen, MD;  Location: [facility] INVASIVE CV LAB;  Service: Cardiovascular;  Laterality: Left;  SFA   PITUITARY SURGERY       TEE WITHOUT CARDIOVERSION N/A 04/26/2021    Procedure: TRANSESOPHAGEAL ECHOCARDIOGRAM (TEE);  Surgeon: Sheryle Donning, MD;  Location: [facility] ENDOSCOPY;  Service: Cardiovascular;  Laterality: N/A;   TRANSTHORACIC ECHOCARDIOGRAM   08/30/2013    mild LVH. EF 50-55%. Moderate HK of the entire inferior myocardium. GR 1 DD. Mild LA dilation. Mildly reduced RV function   TYMPANOMASTOIDECTOMY Right 12/28/2017    Procedure: RIGHT TYMPANOMASTOIDECTOMY;  Surgeon: Reynold Caves, MD;  Location: [facility];  Service: ENT;  Laterality: Right;               Family History  Problem Relation Age of Onset   Cancer Mother 60        multiple myeloma   Heart attack Father 69   Cancer Sister     Alzheimer's disease Maternal Grandmother            SOCIAL HISTORY: Social History         Socioeconomic History   Marital status: Widowed      Spouse name: Not on file   Number of children: 1   Years of education: Not on file   Highest education level: Not on file  Occupational History    Not on file  Tobacco Use   Smoking status: Former      Passive exposure: Never   Smokeless tobacco: Never  Vaping Use   Vaping status: Never Used  Substance and Sexual Activity   Alcohol  use: No   Drug use: No   Sexual activity: Not Currently      Birth control/protection: Post-menopausal  Other Topics Concern   Not on  file  Social History Narrative    Widow. Works at [employer].    Former smoker.    Overall not very active.  Hoping to get into water aerobics class.    Social Drivers of Acupuncturist Strain: Not on file  Food Insecurity: Not on file  Transportation Needs: Not on file  Physical Activity: Not on file  Stress: Not on file  Social Connections: Unknown (08/05/2021)    Received from [facility]    Social Network     Social Network: Not on file  Intimate Partner Violence: Unknown (06/26/2021)    Received from [facility], [facility]    HITS     Physically Hurt: Not on file     Insult or Talk Down To: Not on file     Threaten Physical Harm: Not on file     Scream or Curse: Not on file      Allergies       Allergies  Allergen Reactions   Strawberry Extract Itching, Swelling and Anaphylaxis      Mouth swells and gets itchy              Current Outpatient Medications  Medication Sig Dispense Refill   apixaban  (ELIQUIS ) 5 MG TABS tablet TAKE 1 TABLET BY MOUTH TWICE A DAY 60 tablet 5   atorvastatin  (LIPITOR) 40 MG tablet Take 1 tablet (40 mg total) by mouth daily at 6 PM. 30 tablet 0   B-D UF III MINI PEN NEEDLES 31G X 5 MM MISC Inject into the skin.       clopidogrel  (PLAVIX ) 75 MG tablet Take 1 tablet (75 mg total) by mouth daily. 90 tablet 3   Continuous Blood Gluc Sensor (FREESTYLE LIBRE 2 SENSOR) MISC Apply topically every 14 (fourteen) days.       cycloSPORINE (RESTASIS) 0.05 % ophthalmic emulsion Place 1 drop into both eyes 2 (two) times daily as needed (dry eyes).       dapagliflozin  propanediol  (FARXIGA ) 10 MG TABS tablet Take 10 mg by mouth daily.       diclofenac  Sodium (VOLTAREN ) 1 % GEL Apply 4 g topically 4 (four) times daily. (Patient taking differently: Apply 4 g topically 4 (four) times daily as needed (knee pain).) 400 g 0   ferrous sulfate  325 (65 FE) MG tablet Take 325 mg by mouth at bedtime.       gabapentin  (NEURONTIN ) 100 MG capsule Take 1 capsule (100 mg total) by mouth at bedtime. 90 capsule 0   insulin  aspart (NOVOLOG  FLEXPEN) 100 UNIT/ML FlexPen Inject 2-10 Units into the skin 3 (three) times daily with meals. Sliding scale insulin        Insulin  Glargine (BASAGLAR  KWIKPEN) 100 UNIT/ML Inject 24 Units into the skin at bedtime. (Patient taking differently: Inject 18 Units into the skin at bedtime.)       levothyroxine  (SYNTHROID ) 125 MCG tablet Take 1 tablet (125 mcg total) by mouth daily before breakfast. 30 tablet 2   midodrine  (PROAMATINE ) 5 MG tablet Take 1 tablet (5 mg total) by mouth 2 (two) times daily with breakfast and lunch. (Patient taking differently: Take 5 mg by mouth 2 (two) times daily with a meal. Lunch and Dinner)       mupirocin  ointment (BACTROBAN ) 2 % Apply 1 Application topically 2 (two) times daily. 30 g 2   nitroGLYCERIN  (NITROSTAT ) 0.4 MG SL tablet Place 0.4 mg under the  tongue every 5 (five) minutes as needed for chest pain.       oxybutynin  (DITROPAN -XL) 10 MG 24 hr tablet Take 10 mg by mouth daily after lunch.       pantoprazole  (PROTONIX ) 40 MG tablet Take 40 mg by mouth daily.       polyethylene glycol powder (GLYCOLAX /MIRALAX ) 17 GM/SCOOP powder Take 1 capful with water (17 g) by mouth daily. (Patient taking differently: Take 17 g by mouth daily as needed (constipation.).) 238 g 0   Propylene Glycol (SYSTANE COMPLETE) 0.6 % SOLN Place 1 drop into both eyes 2 (two) times daily as needed (dry/irritated eyes.).       trimethoprim  (TRIMPEX ) 100 MG tablet Take 100 mg by mouth at bedtime.          No current facility-administered medications for  this visit.        REVIEW OF SYSTEMS:  [X]  denotes positive finding, [ ]  denotes negative finding Cardiac   Comments:  Chest pain or chest pressure:      Shortness of breath upon exertion:      Short of breath when lying flat:      Irregular heart rhythm:             Vascular      Pain in calf, thigh, or hip brought on by ambulation:      Pain in feet at night that wakes you up from your sleep:       Blood clot in your veins:      Leg swelling:              Pulmonary      Oxygen at home:      Productive cough:       Wheezing:              Neurologic      Sudden weakness in arms or legs:       Sudden numbness in arms or legs:       Sudden onset of difficulty speaking or slurred speech:      Temporary loss of vision in one eye:       Problems with dizziness:              Gastrointestinal      Blood in stool:       Vomited blood:              Genitourinary      Burning when urinating:       Blood in urine:             Psychiatric      Major depression:              Hematologic      Bleeding problems:      Problems with blood clotting too easily:             Skin      Rashes or ulcers:             Constitutional      Fever or chills:          PHYSICAL EXAM: There were no vitals filed for this visit.   GENERAL: The patient is a well-nourished female, in no acute distress. The vital signs are documented above. CARDIAC: There is a regular rate and rhythm.  VASCULAR:  Bilateral femoral pulses palpable Left BKA No palpable right pedal pulses but no tissue loss PULMONARY: No respiratory distress ABDOMEN: Soft and  non-tender. MUSCULOSKELETAL: There are no major deformities or cyanosis. NEUROLOGIC: No focal weakness or paresthesias are detected. SKIN: There are no ulcers or rashes noted. PSYCHIATRIC: The patient has a normal affect.   DATA:    ABIs today are 0.83 on the right multiphasic and she has a BKA on the left   Right lower extremity arterial duplex  shows a popliteal velocity of 453 suggesting a high-grade greater than 75% stenosis (this is stable from a velocity of 452 on 01/13/2023)   Assessment/Plan:   73 y.o. female, with multiple comorbidities that presents for 3-month follow-up of PAD.  Patient has had an extensive history of revascularization.  On 11/15/2020 she had a right SFA above-knee popliteal stent for CLI with a wound.  Most recently on 11/20/2022 she underwent shockwave lithotripsy of her right popliteal artery distal to the stent.  We are watching a recurrent right popliteal stenosis.  Still has a significantly elevated velocity of 453 in the right popliteal artery.  I discussed this suggest an ongoing flow-limiting stenosis.  I have recommended returning to the Cath Lab for right leg angiogram possible right popliteal intervention including lithotripsy if needed.  Discussed this is to maintain patency of her right SFA pop stents.  Will get scheduled in the Cath Lab at her convenience.  Will need to hold Eliquis  for the procedure and she remains on Eliquis  Plavix .  Risk benefits discussed.         Young Hensen, MD [facility] Office: [phone]

## 2023-09-10 NOTE — Progress Notes (Signed)
Pure wick placed, peri care given, tolerated well, safety maintained

## 2023-09-10 NOTE — Op Note (Signed)
 Patient name: Tammy Good MRN: [id number] DOB: 16-May-1950 Sex: female  09/10/2023 Pre-operative Diagnosis: High-grade right popliteal artery stenosis in setting of previous right SFA popliteal stents for CLI with tissue loss Post-operative diagnosis:  Same Surgeon:  Young Hensen, MD Procedure Performed: 1.  Ultrasound-guided access left common femoral artery 2.  Aortogram with catheter selection of aorta 3.  Right lower extremity arteriogram with catheter selection of the right SFA 4.  Right popliteal artery shockwave lithotripsy (4.5 mm x 60 mm shockwave x 300 pulses) 5.  Right popliteal artery drug-coated balloon angioplasty (5 mm x 80 mm drug-coated Ranger) 6.  47 minutes of monitored moderate conscious sedation time  Indications: Patient is a 73 year old female well-known to vascular surgery that has previously had right SFA above-knee popliteal stenting for CLI with tissue loss.  Recent surveillance showed a high-grade right popliteal artery stenosis behind the knee with a velocity of near 500.  She presents for right lower extremity angiogram with possible invention after risks benefits discussed.  Findings:   Ultrasound-guided access left common femoral artery.  Aortogram showed widely patent infrarenal aorta with patent renal arteries and patent iliacs bilaterally.  On the right her common femoral and profunda were patent as well as her SFA including her SFA stents without flow limiting stenosis.  Her popliteal artery on the right in the above-knee and behind the knee segment had a focal high-grade over 80% stenosis with some adjacent disease.  She had three-vessel runoff.  The right popliteal artery stenosis was then treated with a 4.5 mm x 60 mm shockwave balloon x 300 pulses and then treated with a 5 mm drug-coated Ranger for 3 minutes.  No significant residual stenosis.  Widely patent stents at completion with preserved three-vessel runoff.   Procedure:  The patient  was identified in the holding area and taken to room 8.  The patient was then placed supine on the table and prepped and draped in the usual sterile fashion.  A time out was called.  Patient received Versed  and fentanyl  for conscious moderate sedation.  Vital signs were monitored including heart rate, respiratory rate, oxygenation and blood pressure.  I was present for all moderate sedation.  Ultrasound was used to evaluate the left common femoral artery.  It was patent .  A digital ultrasound image was acquired.  A micropuncture needle was used to access the left common femoral artery under ultrasound guidance.  An 018 wire was advanced without resistance and a micropuncture sheath was placed.  The 018 wire was removed and a benson wire was placed.  The micropuncture sheath was exchanged for a 5 french sheath.  An omniflush catheter was advanced over the wire to the level of L-1.  An abdominal angiogram was obtained.  Next, using the omniflush catheter and a benson wire, the aortic bifurcation was crossed and the catheter was placed into theright external iliac artery and right runoff was obtained.  Ultimately elected for intervention on the right popliteal stenosis behind the knee on the right leg.  I then used a Bentson wire and exchanged for a 5 [place] catapult sheath in the left groin over the aortic bifurcation.  The patient is given 100 units/kg IV heparin .  I then used the angled catheter with a Glidewire to select the SFA and exchanged for a 014 [place] core wire.  This was advanced all the way through the SFA stents and across the popliteal stenosis behind the knee and into the AT.  I then selected shockwave and advanced a 4.5 mm x 60 mm shockwave lithotripsy balloon.  This was inflated initially to 2 atm and I gave 90 pulses and then I inflated to 4 atm for 90 pulses and then I inflated to 6 atm for the the remainder for 300 pulses total.  I then treated this with a drug-coated balloon angioplasty using 5  mm x 80 mm drug-coated Ranger to nominal pressure for 3 minutes.  Widely patent area in the above-knee popliteal artery behind the knee with preserved runoff in the stents.  Wires and catheters were removed.  A short 5 French sheath was placed in the left groin.  Taken to holding for sheath removal.  Plan: Excellent results of the right popliteal intervention today.  Continue Plavix  and restart Eliquis  tomorrow with statin.  Will arrange follow-up in 1 month.  Young Hensen, MD [facility] Office: [phone]

## 2023-09-10 NOTE — Progress Notes (Signed)
 Site area: 59F left groin arterial sheath Site Prior to Removal:  Level 0 Pressure Applied For: 25 minutes Manual:   yes Patient Status During Pull:  stable Post Pull Site:  Level 0 Post Pull Instructions Given:  yes Post Pull Pulses Present: left BKA; rt dp dopplered Dressing Applied:  gauze and tegaderm Bedrest begins @ 1155 Comments:

## 2023-09-16 ENCOUNTER — Other Ambulatory Visit: Payer: Self-pay | Admitting: Cardiology

## 2023-09-16 DIAGNOSIS — I480 Paroxysmal atrial fibrillation: Secondary | ICD-10-CM

## 2023-09-17 NOTE — Telephone Encounter (Signed)
 Prescription refill request for Eliquis  received. Indication: Afib   Last office visit: 09/22/22 Patricio)  Scr: 0.80 (09/10/23)  Age: 73 Weight: 96.6kg  Appropriate dose. Refill sent.

## 2023-10-05 ENCOUNTER — Other Ambulatory Visit: Payer: Self-pay

## 2023-10-05 DIAGNOSIS — I70213 Atherosclerosis of native arteries of extremities with intermittent claudication, bilateral legs: Secondary | ICD-10-CM

## 2023-10-13 ENCOUNTER — Ambulatory Visit (HOSPITAL_COMMUNITY)
Admission: RE | Admit: 2023-10-13 | Discharge: 2023-10-13 | Disposition: A | Discharge status: Home / Self Care | Source: Ambulatory Visit | Attending: Vascular Surgery | Admitting: Vascular Surgery

## 2023-10-13 ENCOUNTER — Ambulatory Visit (INDEPENDENT_AMBULATORY_CARE_PROVIDER_SITE_OTHER): Attending: Vascular Surgery | Admitting: Vascular Surgery

## 2023-10-13 ENCOUNTER — Ambulatory Visit (HOSPITAL_BASED_OUTPATIENT_CLINIC_OR_DEPARTMENT_OTHER)
Admit: 2023-10-13 | Discharge: 2023-10-13 | Disposition: A | Discharge status: Home / Self Care | Attending: Vascular Surgery

## 2023-10-13 ENCOUNTER — Encounter: Payer: Self-pay | Admitting: Vascular Surgery

## 2023-10-13 VITALS — BP 141/63 | HR 78 | Temp 97.6°F | Resp 20 | Ht 67.0 in | Wt 216.1 lb

## 2023-10-13 DIAGNOSIS — I739 Peripheral vascular disease, unspecified: Secondary | ICD-10-CM | POA: Diagnosis not present

## 2023-10-13 DIAGNOSIS — I70213 Atherosclerosis of native arteries of extremities with intermittent claudication, bilateral legs: Secondary | ICD-10-CM

## 2023-10-13 LAB — VAS US ABI WITH/WO TBI: Right ABI: 0.87

## 2023-10-13 NOTE — Progress Notes (Signed)
 Patient name: Tammy Good MRN: [id number] DOB: 06-Sep-1950 Sex: female  REASON FOR CONSULT: Follow-up s/p right leg revascularization   HPI: Tammy Good is a 73 y.o. female, with multiple comorbidities that presents for follow-up of her PAD.  Patient has had an extensive history of revascularization.  On 11/15/2020 she had a right SFA above-knee popliteal stent for CLI with a wound.  On 07/11/2021 she had a left SFA AT angioplasty with TP trunk angioplasty and PT angioplasty for CLI with a wound.  Then on 11/20/2022 she underwent shockwave lithotripsy of her right popliteal artery distal to the stent.  Has since undergone left BKA.    Most recently underwent right lower extremity angiogram with shockwave lithotripsy and DCB of the popliteal artery on 09/10/2023 for a velocity of 453 with high grade stenosis.  No complaints today.  Past Medical History:  Diagnosis Date   CAD S/P percutaneous coronary angioplasty 08/2013   100% mRCA - PCI Integrity Resolute DES 3.0 mm x 38 mm - 3.35 mm; PTCA of RPA V 2.0 mm x 15 mm   CHF (congestive heart failure) (HCC)    Cholesteatoma    right   Diabetes mellitus type 2 in obese    On insulin  and Invokana   History of osteomyelitis L 5th Toe all 05/2019   s/p Partial Ray Amputation with partial closure; 6 wks Abx & LSFA Atherectomy/DEB PTA with Stent for focal dissection.   Hyperlipidemia with target LDL less than 70    Hypothyroidism (acquired)    Mild essential hypertension    Obesity (BMI 30-39.9) 11/17/2013   PAD (peripheral artery disease) (HCC) 05/26/2019   05/26/19: Abd AoGram- BLE runoff -> L SFA orbital atherectomy - PTA w/ DEB & Stent 6 x 40 Luttonix (for focal dissection) - patent Pop A with 3 V runoff. LEA Dopplers 01/03/2020: RABI (prev) 0.68 (0.53)/ RTBI (prev) 0.58 (0.33); LABI (prev) 0.80 (0.64), LTBI (prev) 0.64 (0.51); R mSFA ~50-74%, L mSFA 50-74%. Patent Prox SFA stent < 49% stenosis   Panhypopituitarism (HCC)    Peripheral  arterial disease (HCC)    ST elevation myocardial infarction (STEMI) of inferior wall, subsequent episode of care (HCC) 08/2013   80% branch of D1, 40% mid AV groove circumflex, 100% RCA with subacute thrombus -- thrombus extending into RPA V with 100% occlusion after initial angioplasty of mid RCA ;; Post MI ECHO 6/9/'15: EF 50-55%, mild LVH with moderate HK of inferior wall, Gr1 DD, mild LA dilation; mildly reduced RV function    Past Surgical History:  Procedure Laterality Date   ABDOMINAL AORTOGRAM N/A 09/10/2023   Procedure: ABDOMINAL AORTOGRAM;  Surgeon: Gretta Lonni PARAS, MD;  [facility]: [facility];  Service: Cardiovascular;  Laterality: N/A;   ABDOMINAL AORTOGRAM W/LOWER EXTREMITY N/A 05/26/2019   Procedure: ABDOMINAL AORTOGRAM W/LOWER EXTREMITY;  Surgeon: Gretta Lonni PARAS, MD;  Location: [facility];  Service: Cardiovascular;  Laterality: N/A;   ABDOMINAL AORTOGRAM W/LOWER EXTREMITY N/A 11/15/2020   Procedure: ABDOMINAL AORTOGRAM W/LOWER EXTREMITY;  Surgeon: Gretta Lonni PARAS, MD;  [facility]: [facility];  Service: Cardiovascular;  Laterality: N/A;   ABDOMINAL AORTOGRAM W/LOWER EXTREMITY N/A 04/18/2021   Procedure: ABDOMINAL AORTOGRAM W/LOWER EXTREMITY;  Surgeon: Gretta Lonni PARAS, MD;  [facility]: [facility];  Service: Cardiovascular;  Laterality: N/A;   ABDOMINAL AORTOGRAM W/LOWER EXTREMITY Left 07/11/2021   Procedure: ABDOMINAL AORTOGRAM W/LOWER EXTREMITY;  Surgeon: Gretta Lonni PARAS, MD;  [facility]: [facility];  Service:  Cardiovascular;  Laterality: Left;   ABDOMINAL AORTOGRAM W/LOWER EXTREMITY N/A 11/20/2022   Procedure: ABDOMINAL AORTOGRAM W/LOWER EXTREMITY;  Surgeon: Gretta Lonni PARAS, MD;  [facility]: [facility];  Service: Cardiovascular;  Laterality: N/A;   AMPUTATION Left 05/25/2019   Procedure: AMPUTATION RAY 5th;  Surgeon: Gershon Donnice SAUNDERS, DPM;  Location: [facility];  Service: Podiatry;  Laterality: Left;   AMPUTATION  Left 01/14/2022   Procedure: LEFT BELOW THE KNEE AMPUTATION;  Surgeon: Elsa Lonni SAUNDERS, MD;  Location: [facility] OR;  Service: Orthopedics;  Laterality: Left;  LENGTH OF SURGERY: 90 MINUTES   APPLICATION OF WOUND VAC Left 01/14/2022   Procedure: WOUND VAC PLACEMENT;  Surgeon: Elsa Lonni SAUNDERS, MD;  Location: [facility] OR;  Service: Orthopedics;  Laterality: Left;   BONE BIOPSY Left 04/24/2021   Procedure: BONE BIOPSY;  Surgeon: Burt Fus, DPM;  Location: [facility];  Service: Podiatry;  Laterality: Left;  left ankle/fibula   BONE BIOPSY Left 07/15/2021   Procedure: LEFT FOOT BONE BIOPSY;  Surgeon: Gershon Donnice SAUNDERS, DPM;  [facility]: [facility];  Service: Podiatry;  Laterality: Left;   Cardiac Event Monitor  July-August 2015   Sinus rhythm with PVCs   CHOLECYSTECTOMY     COLONOSCOPY N/A 08/31/2013   Procedure: COLONOSCOPY;  Surgeon: Renaye Sous, MD;  Location: [facility] ENDOSCOPY;  Service: Endoscopy;  Laterality: N/A;   CORONARY STENT INTERVENTION N/A 11/19/2020   Procedure: CORONARY STENT INTERVENTION;  Surgeon: Mady Lonni, MD;  [facility];  Service: Cardiovascular;  Laterality: N/A;   ESOPHAGOGASTRODUODENOSCOPY N/A 09/01/2013   Procedure: ESOPHAGOGASTRODUODENOSCOPY (EGD);  Surgeon: Belvie JONETTA Just, MD;  Location: [facility] ENDOSCOPY;  Service: Endoscopy;  Laterality: N/A;  bedside   ESOPHAGOGASTRODUODENOSCOPY (EGD) WITH PROPOFOL  N/A 05/26/2021   Procedure: ESOPHAGOGASTRODUODENOSCOPY (EGD) WITH PROPOFOL ;  Surgeon: Just Belvie, MD;  Location: [facility] ENDOSCOPY;  Service: Gastroenterology;  Laterality: N/A;   EYE SURGERY Bilateral    bilateral cataracts   INCISION AND DRAINAGE Left 04/24/2021   Procedure: INCISION AND DRAINAGE;  Surgeon: Burt Fus, DPM;  Location: [facility];  Service: Podiatry;  Laterality: Left;   IRRIGATION AND DEBRIDEMENT FOOT Left 07/15/2021   Procedure: IRRIGATION AND DEBRIDEMENT FOOT;  Surgeon: Gershon Donnice SAUNDERS, DPM;  Location: [facility];  Service: Podiatry;  Laterality:  Left;   LEFT HEART CATH AND CORONARY ANGIOGRAPHY N/A 11/19/2020   Procedure: LEFT HEART CATH AND CORONARY ANGIOGRAPHY;  Surgeon: Mady Lonni, MD;  [facility];  Service: Cardiovascular;  Laterality: N/A;   LEFT HEART CATHETERIZATION WITH CORONARY ANGIOGRAM N/A 08/30/2013   Procedure: LEFT HEART CATHETERIZATION WITH CORONARY ANGIOGRAM;  Surgeon: Alm LELON Clay, MD;  Location: [facility] CATH LAB: 100% mRCA (thrombus - extends to RPAV), 80% D1, 40% AVG Cx.   LOWER EXTREMITY ANGIOGRAPHY N/A 09/10/2023   Procedure: Lower Extremity Angiography;  Surgeon: Gretta Lonni PARAS, MD;  Location: [facility] INVASIVE CV LAB;  Service: Cardiovascular;  Laterality: N/A;   LOWER EXTREMITY INTERVENTION N/A 09/10/2023   Procedure: LOWER EXTREMITY INTERVENTION;  Surgeon: Gretta Lonni PARAS, MD;  [facility];  Service: Cardiovascular;  Laterality: N/A;   PERCUTANEOUS CORONARY STENT INTERVENTION (PCI-S)  08/30/2013   Procedure: PERCUTANEOUS CORONARY STENT INTERVENTION (PCI-S);  Surgeon: Alm LELON Clay, MD;  Location: [facility] CATH LAB;  Integrity Resolute DES 2.0 mm x 38 mm -- 3.35 mm.; PTCA of proximal RPA V. - 3.0 mm x 15 mm balloon   PERIPHERAL INTRAVASCULAR LITHOTRIPSY Right 11/20/2022   Procedure: PERIPHERAL INTRAVASCULAR LITHOTRIPSY;  Surgeon: Gretta Lonni PARAS, MD;  [facility];  Service: Cardiovascular;  Laterality: Right;  POP   PERIPHERAL VASCULAR ATHERECTOMY  05/26/2019   Procedure: PERIPHERAL VASCULAR ATHERECTOMY;  Surgeon: Gretta Lonni PARAS, MD;  [facility];  Service: Cardiovascular;;  Left SFA   PERIPHERAL VASCULAR BALLOON ANGIOPLASTY Left 07/11/2021   Procedure: PERIPHERAL VASCULAR BALLOON ANGIOPLASTY;  Surgeon: Gretta Lonni PARAS, MD;  [facility];  Service: Cardiovascular;  Laterality: Left;  PT TRUNK / AT   PERIPHERAL VASCULAR BALLOON ANGIOPLASTY Right 11/20/2022   Procedure: PERIPHERAL VASCULAR BALLOON ANGIOPLASTY;   Surgeon: Gretta Lonni PARAS, MD;  [facility];  Service: Cardiovascular;  Laterality: Right;  POP   PERIPHERAL VASCULAR INTERVENTION  05/26/2019   Procedure: PERIPHERAL VASCULAR INTERVENTION;  Surgeon: Gretta Lonni PARAS, MD;  [facility];  Service: Cardiovascular;;  Left SFA   PERIPHERAL VASCULAR INTERVENTION Right 11/15/2020   Procedure: PERIPHERAL VASCULAR INTERVENTION;  Surgeon: Gretta Lonni PARAS, MD;  [facility];  Service: Cardiovascular;  Laterality: Right;  Superficial Femoral Artery   PERIPHERAL VASCULAR INTERVENTION Left 07/11/2021   Procedure: PERIPHERAL VASCULAR INTERVENTION;  Surgeon: Gretta Lonni PARAS, MD;  [facility];  Service: Cardiovascular;  Laterality: Left;  SFA   PITUITARY SURGERY     TEE WITHOUT CARDIOVERSION N/A 04/26/2021   Procedure: TRANSESOPHAGEAL ECHOCARDIOGRAM (TEE);  Surgeon: Lonni Slain, MD;  Location[facility] ENDOSCOPY;  Service: Cardiovascular;  Laterality: N/A;   TRANSTHORACIC ECHOCARDIOGRAM  08/30/2013   mild LVH. EF 50-55%. Moderate HK of the entire inferior myocardium. GR 1 DD. Mild LA dilation. Mildly reduced RV function   TYMPANOMASTOIDECTOMY Right 12/28/2017   Procedure: RIGHT TYMPANOMASTOIDECTOMY;  Surgeon: Karis Clunes, MD;  Location: [facility];  Service: ENT;  Laterality: Right;    Family History  Problem Relation Age of Onset   Cancer Mother 31       multiple myeloma   Heart attack Father 57   Cancer Sister    Alzheimer's disease Maternal Grandmother     SOCIAL HISTORY: Social History   Socioeconomic History   Marital status: Widowed    Spouse name: Not on file   Number of children: 1   Years of education: Not on file   Highest education level: Not on file  Occupational History   Not on file  Tobacco Use   Smoking status: Former    Passive exposure: Never   Smokeless tobacco: Never  Vaping Use   Vaping status: Never Used  Substance and  Sexual Activity   Alcohol  use: No   Drug use: No   Sexual activity: Not Currently    Birth control/protection: Post-menopausal  Other Topics Concern   Not on file  Social History Narrative   Widow. Works at [employer].   Former smoker.   Overall not very active.  Hoping to get into water aerobics class.   Social Drivers of Corporate investment banker Strain: Not on file  Food Insecurity: Not on file  Transportation Needs: Not on file  Physical Activity: Not on file  Stress: Not on file  Social Connections: Unknown (08/05/2021)   [facility]   Social Network    Social Network: Not on file  Intimate Partner Violence: Unknown (06/26/2021)   Received from [facility]   HITS    Physically Hurt: Not on file    Insult or Talk Down To: Not on file    Threaten Physical Harm:  Not on file    Scream or Curse: Not on file    Allergies  Allergen Reactions   Strawberry Extract Itching, Swelling and Anaphylaxis    Mouth swells and gets itchy    Current Outpatient Medications  Medication Sig Dispense Refill   apixaban  (ELIQUIS ) 5 MG TABS tablet TAKE 1 TABLET BY MOUTH TWICE A DAY 180 tablet 1   atorvastatin  (LIPITOR) 40 MG tablet Take 1 tablet (40 mg total) by mouth daily at 6 PM. 30 tablet 0   B-D UF III MINI PEN NEEDLES 31G X 5 MM MISC Inject into the skin.     clopidogrel  (PLAVIX ) 75 MG tablet TAKE 1 TABLET BY MOUTH DAILY 30 tablet 0   Continuous Blood Gluc Sensor (FREESTYLE LIBRE 2 SENSOR) MISC Apply topically every 14 (fourteen) days.     cycloSPORINE (RESTASIS) 0.05 % ophthalmic emulsion Place 1 drop into both eyes 2 (two) times daily as needed (dry eyes).     dapagliflozin  propanediol (FARXIGA ) 10 MG TABS tablet Take 10 mg by mouth daily.     dexamethasone  (DECADRON ) 0.5 MG tablet Take 0.5 mg by mouth daily.     diclofenac  Sodium (VOLTAREN ) 1 % GEL Apply 4 g topically 4 (four) times daily. (Patient taking differently: Apply 4 g topically 4 (four) times daily  as needed (knee pain).) 400 g 0   ferrous sulfate  325 (65 FE) MG tablet Take 325 mg by mouth at bedtime.     gabapentin  (NEURONTIN ) 100 MG capsule Take 1 capsule (100 mg total) by mouth at bedtime. 90 capsule 0   insulin  aspart (NOVOLOG  FLEXPEN) 100 UNIT/ML FlexPen Inject 2-10 Units into the skin 3 (three) times daily with meals. Sliding scale insulin      Insulin  Glargine (BASAGLAR  KWIKPEN) 100 UNIT/ML Inject 24 Units into the skin at bedtime. (Patient taking differently: Inject 18 Units into the skin at bedtime.)     levothyroxine  (SYNTHROID ) 125 MCG tablet Take 1 tablet (125 mcg total) by mouth daily before breakfast. 30 tablet 2   midodrine  (PROAMATINE ) 5 MG tablet Take 1 tablet (5 mg total) by mouth 2 (two) times daily with breakfast and lunch. (Patient taking differently: Take 5 mg by mouth 2 (two) times daily with a meal. Lunch and Dinner)     nitroGLYCERIN  (NITROSTAT ) 0.4 MG SL tablet Place 0.4 mg under the tongue every 5 (five) minutes as needed for chest pain.     pantoprazole  (PROTONIX ) 40 MG tablet Take 40 mg by mouth daily.     polyethylene glycol powder (GLYCOLAX /MIRALAX ) 17 GM/SCOOP powder Take 1 capful with water (17 g) by mouth daily. (Patient taking differently: Take 17 g by mouth daily as needed (constipation.).) 238 g 0   Propylene Glycol (SYSTANE COMPLETE) 0.6 % SOLN Place 1 drop into both eyes 2 (two) times daily as needed (dry/irritated eyes.).     No current facility-administered medications for this visit.    REVIEW OF SYSTEMS:  [X]  denotes positive finding, [ ]  denotes negative finding Cardiac  Comments:  Chest pain or chest pressure:    Shortness of breath upon exertion:    Short of breath when lying flat:    Irregular heart rhythm:        Vascular    Pain in calf, thigh, or hip brought on by ambulation:    Pain in feet at night that wakes you up from your sleep:     Blood clot in your veins:    Leg swelling:  Pulmonary    Oxygen at home:    Productive  cough:     Wheezing:         Neurologic    Sudden weakness in arms or legs:     Sudden numbness in arms or legs:     Sudden onset of difficulty speaking or slurred speech:    Temporary loss of vision in one eye:     Problems with dizziness:         Gastrointestinal    Blood in stool:     Vomited blood:         Genitourinary    Burning when urinating:     Blood in urine:        Psychiatric    Major depression:         Hematologic    Bleeding problems:    Problems with blood clotting too easily:        Skin    Rashes or ulcers:        Constitutional    Fever or chills:      PHYSICAL EXAM: Vitals:   10/13/23 1106  BP: (!) 141/63  Pulse: 78  Resp: 20  Temp: 97.6 F (36.4 C)  TempSrc: Temporal  SpO2: 98%  Weight: 216 lb 1.6 oz (98 kg)  Height: 5' 7 (1.702 m)    GENERAL: The patient is a well-nourished female, in no acute distress. The vital signs are documented above. CARDIAC: There is a regular rate and rhythm.  VASCULAR:  Bilateral femoral pulses palpable Left BKA No palpable right pedal pulses but no tissue loss Brisk multiphasic right DP signal PULMONARY: No respiratory distress ABDOMEN: Soft and non-tender. MUSCULOSKELETAL: There are no major deformities or cyanosis. NEUROLOGIC: No focal weakness or paresthesias are detected. SKIN: There are no ulcers or rashes noted. PSYCHIATRIC: The patient has a normal affect.  DATA:   ABIs today are improved on the right to 0.87 (previously 0.83)  Right lower extremity arterial duplex shows resolution of the right popliteal high-grade stenosis status post recent intervention  Assessment/Plan:  73 y.o. female, with multiple comorbidities that presents for follow-up of PAD.  Patient has had an extensive history of revascularization.  On 11/15/2020 she had a right SFA above-knee popliteal stent for CLI with a wound.  On 07/11/2021 she had a left SFA AT angioplasty with TP trunk angioplasty and PT angioplasty for CLI  with a wound.  Then on 11/20/2022 she underwent shockwave lithotripsy of her right popliteal artery distal to the stent.  Has since undergone left BKA.    Most recently underwent right lower extremity angiogram with shockwave lithotripsy and DCB of the popliteal artery on 09/10/2023 for a velocity of 453 with high grade stenosis.  Discussed that her right popliteal artery high-grade stenosis is now resolved by duplex.  She has a brisk Doppler signal in the foot.  She is on Eliquis  Plavix  for risk reduction.  I will see her in 6 months with repeat noninvasive imaging.  Lonni DOROTHA Gaskins, MD [facility] Office: [phone]

## 2023-10-14 ENCOUNTER — Other Ambulatory Visit: Payer: Self-pay | Admitting: *Deleted

## 2023-10-14 ENCOUNTER — Other Ambulatory Visit: Payer: Self-pay | Admitting: Cardiology

## 2023-10-14 DIAGNOSIS — I739 Peripheral vascular disease, unspecified: Secondary | ICD-10-CM

## 2023-10-14 DIAGNOSIS — I70213 Atherosclerosis of native arteries of extremities with intermittent claudication, bilateral legs: Secondary | ICD-10-CM

## 2023-10-14 DIAGNOSIS — T82856A Stenosis of peripheral vascular stent, initial encounter: Secondary | ICD-10-CM

## 2023-10-30 ENCOUNTER — Ambulatory Visit (INDEPENDENT_AMBULATORY_CARE_PROVIDER_SITE_OTHER): Admitting: Podiatry

## 2023-10-30 VITALS — Ht 67.0 in | Wt 216.0 lb

## 2023-10-30 DIAGNOSIS — E1149 Type 2 diabetes mellitus with other diabetic neurological complication: Secondary | ICD-10-CM

## 2023-10-30 DIAGNOSIS — B351 Tinea unguium: Secondary | ICD-10-CM | POA: Diagnosis not present

## 2023-10-30 DIAGNOSIS — Z7901 Long term (current) use of anticoagulants: Secondary | ICD-10-CM | POA: Diagnosis not present

## 2023-10-31 NOTE — Progress Notes (Signed)
 Subjective: Chief Complaint  Patient presents with   Nail Problem    Rm 14 Patient is here for routine foot care and nail trimming. Patient has no additional concerns.    73 year old female presents the office with above concerns.  States that she has been doing well, no open lesions or new concerns today. Her nails are thick, discolored on the right foot causing discomfort.    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  There are no open lesions present. Unchanged. Nails of the right are hypertrophic, dystrophic with brown discoloration and several debris present for scar discomfort x 5 on the right foot. No pain with calf compression, swelling, warmth, erythema  Assessment: Right foot, ankle swelling; PAD; onychomycosis on anticoagulation; neuropathy  Plan: -All treatment options discussed with the patient including all alternatives, risks, complications.  -Continue to follow-up with vascular surgery. -Encouraged elevation today to help with the swelling. -Sharply debrided nails x 5 without any complications or bleeding -Daily foot inspection, glucose control  Return in about 3 months (around 01/30/2024).  Donnice JONELLE Fees DPM

## 2023-11-12 ENCOUNTER — Other Ambulatory Visit: Payer: Self-pay | Admitting: Cardiology

## 2023-12-08 ENCOUNTER — Other Ambulatory Visit: Payer: Self-pay | Admitting: Cardiology

## 2023-12-28 ENCOUNTER — Other Ambulatory Visit: Payer: Self-pay

## 2023-12-28 ENCOUNTER — Inpatient Hospital Stay (HOSPITAL_COMMUNITY)
Admission: EM | Admit: 2023-12-28 | Discharge: 2023-12-30 | DRG: 378 | Disposition: A | Discharge status: Home / Self Care | Attending: Internal Medicine | Admitting: Internal Medicine

## 2023-12-28 ENCOUNTER — Emergency Department (HOSPITAL_COMMUNITY)

## 2023-12-28 ENCOUNTER — Encounter (HOSPITAL_COMMUNITY): Payer: Self-pay | Admitting: Emergency Medicine

## 2023-12-28 DIAGNOSIS — E785 Hyperlipidemia, unspecified: Secondary | ICD-10-CM

## 2023-12-28 DIAGNOSIS — N289 Disorder of kidney and ureter, unspecified: Secondary | ICD-10-CM | POA: Diagnosis present

## 2023-12-28 DIAGNOSIS — R7989 Other specified abnormal findings of blood chemistry: Secondary | ICD-10-CM | POA: Diagnosis present

## 2023-12-28 DIAGNOSIS — Z820 Family history of epilepsy and other diseases of the nervous system: Secondary | ICD-10-CM

## 2023-12-28 DIAGNOSIS — I2582 Chronic total occlusion of coronary artery: Secondary | ICD-10-CM | POA: Diagnosis present

## 2023-12-28 DIAGNOSIS — Z6831 Body mass index (BMI) 31.0-31.9, adult: Secondary | ICD-10-CM

## 2023-12-28 DIAGNOSIS — D500 Iron deficiency anemia secondary to blood loss (chronic): Principal | ICD-10-CM

## 2023-12-28 DIAGNOSIS — Z79899 Other long term (current) drug therapy: Secondary | ICD-10-CM

## 2023-12-28 DIAGNOSIS — Z8615 Personal history of latent tuberculosis infection: Secondary | ICD-10-CM

## 2023-12-28 DIAGNOSIS — E1159 Type 2 diabetes mellitus with other circulatory complications: Secondary | ICD-10-CM

## 2023-12-28 DIAGNOSIS — R531 Weakness: Secondary | ICD-10-CM | POA: Diagnosis not present

## 2023-12-28 DIAGNOSIS — K449 Diaphragmatic hernia without obstruction or gangrene: Secondary | ICD-10-CM | POA: Diagnosis present

## 2023-12-28 DIAGNOSIS — E7849 Other hyperlipidemia: Secondary | ICD-10-CM | POA: Diagnosis present

## 2023-12-28 DIAGNOSIS — Z8249 Family history of ischemic heart disease and other diseases of the circulatory system: Secondary | ICD-10-CM

## 2023-12-28 DIAGNOSIS — Z79890 Hormone replacement therapy (postmenopausal): Secondary | ICD-10-CM

## 2023-12-28 DIAGNOSIS — Z807 Family history of other malignant neoplasms of lymphoid, hematopoietic and related tissues: Secondary | ICD-10-CM

## 2023-12-28 DIAGNOSIS — E669 Obesity, unspecified: Secondary | ICD-10-CM | POA: Diagnosis present

## 2023-12-28 DIAGNOSIS — K429 Umbilical hernia without obstruction or gangrene: Secondary | ICD-10-CM | POA: Diagnosis present

## 2023-12-28 DIAGNOSIS — D649 Anemia, unspecified: Secondary | ICD-10-CM | POA: Diagnosis present

## 2023-12-28 DIAGNOSIS — E66811 Obesity, class 1: Secondary | ICD-10-CM | POA: Diagnosis present

## 2023-12-28 DIAGNOSIS — I110 Hypertensive heart disease with heart failure: Secondary | ICD-10-CM | POA: Diagnosis present

## 2023-12-28 DIAGNOSIS — K5909 Other constipation: Secondary | ICD-10-CM | POA: Diagnosis present

## 2023-12-28 DIAGNOSIS — Z23 Encounter for immunization: Secondary | ICD-10-CM

## 2023-12-28 DIAGNOSIS — Z794 Long term (current) use of insulin: Secondary | ICD-10-CM

## 2023-12-28 DIAGNOSIS — I5042 Chronic combined systolic (congestive) and diastolic (congestive) heart failure: Secondary | ICD-10-CM | POA: Diagnosis present

## 2023-12-28 DIAGNOSIS — I739 Peripheral vascular disease, unspecified: Secondary | ICD-10-CM | POA: Diagnosis present

## 2023-12-28 DIAGNOSIS — K31811 Angiodysplasia of stomach and duodenum with bleeding: Principal | ICD-10-CM | POA: Diagnosis present

## 2023-12-28 DIAGNOSIS — Z8719 Personal history of other diseases of the digestive system: Secondary | ICD-10-CM

## 2023-12-28 DIAGNOSIS — R42 Dizziness and giddiness: Secondary | ICD-10-CM | POA: Diagnosis present

## 2023-12-28 DIAGNOSIS — Z1152 Encounter for screening for COVID-19: Secondary | ICD-10-CM

## 2023-12-28 DIAGNOSIS — K922 Gastrointestinal hemorrhage, unspecified: Secondary | ICD-10-CM | POA: Diagnosis present

## 2023-12-28 DIAGNOSIS — I5022 Chronic systolic (congestive) heart failure: Secondary | ICD-10-CM | POA: Diagnosis present

## 2023-12-28 DIAGNOSIS — I252 Old myocardial infarction: Secondary | ICD-10-CM

## 2023-12-28 DIAGNOSIS — Z7902 Long term (current) use of antithrombotics/antiplatelets: Secondary | ICD-10-CM

## 2023-12-28 DIAGNOSIS — K921 Melena: Principal | ICD-10-CM | POA: Diagnosis present

## 2023-12-28 DIAGNOSIS — D62 Acute posthemorrhagic anemia: Secondary | ICD-10-CM | POA: Diagnosis present

## 2023-12-28 DIAGNOSIS — K31819 Angiodysplasia of stomach and duodenum without bleeding: Secondary | ICD-10-CM | POA: Diagnosis present

## 2023-12-28 DIAGNOSIS — E119 Type 2 diabetes mellitus without complications: Secondary | ICD-10-CM

## 2023-12-28 DIAGNOSIS — R112 Nausea with vomiting, unspecified: Secondary | ICD-10-CM | POA: Diagnosis present

## 2023-12-28 DIAGNOSIS — R9431 Abnormal electrocardiogram [ECG] [EKG]: Secondary | ICD-10-CM | POA: Diagnosis present

## 2023-12-28 DIAGNOSIS — E230 Hypopituitarism: Secondary | ICD-10-CM | POA: Diagnosis present

## 2023-12-28 DIAGNOSIS — Z89512 Acquired absence of left leg below knee: Secondary | ICD-10-CM

## 2023-12-28 DIAGNOSIS — I480 Paroxysmal atrial fibrillation: Secondary | ICD-10-CM | POA: Diagnosis present

## 2023-12-28 DIAGNOSIS — E1151 Type 2 diabetes mellitus with diabetic peripheral angiopathy without gangrene: Secondary | ICD-10-CM | POA: Diagnosis present

## 2023-12-28 DIAGNOSIS — Z9861 Coronary angioplasty status: Secondary | ICD-10-CM

## 2023-12-28 DIAGNOSIS — I2510 Atherosclerotic heart disease of native coronary artery without angina pectoris: Secondary | ICD-10-CM | POA: Diagnosis present

## 2023-12-28 DIAGNOSIS — I2489 Other forms of acute ischemic heart disease: Secondary | ICD-10-CM | POA: Diagnosis present

## 2023-12-28 DIAGNOSIS — K219 Gastro-esophageal reflux disease without esophagitis: Secondary | ICD-10-CM | POA: Diagnosis present

## 2023-12-28 DIAGNOSIS — E039 Hypothyroidism, unspecified: Secondary | ICD-10-CM | POA: Diagnosis present

## 2023-12-28 DIAGNOSIS — E1169 Type 2 diabetes mellitus with other specified complication: Secondary | ICD-10-CM | POA: Diagnosis present

## 2023-12-28 DIAGNOSIS — Z7901 Long term (current) use of anticoagulants: Secondary | ICD-10-CM

## 2023-12-28 DIAGNOSIS — Z87891 Personal history of nicotine dependence: Secondary | ICD-10-CM

## 2023-12-28 DIAGNOSIS — Z955 Presence of coronary angioplasty implant and graft: Secondary | ICD-10-CM

## 2023-12-28 LAB — COMPREHENSIVE METABOLIC PANEL WITH GFR
ALT: 13 U/L (ref 0–44)
AST: 16 U/L (ref 15–41)
Albumin: 2.7 g/dL — ABNORMAL LOW (ref 3.5–5.0)
Alkaline Phosphatase: 78 U/L (ref 38–126)
Anion gap: 9 (ref 5–15)
BUN: 14 mg/dL (ref 8–23)
CO2: 23 mmol/L (ref 22–32)
Calcium: 8.3 mg/dL — ABNORMAL LOW (ref 8.9–10.3)
Chloride: 104 mmol/L (ref 98–111)
Creatinine, Ser: 1.04 mg/dL — ABNORMAL HIGH (ref 0.44–1.00)
GFR, Estimated: 57 mL/min — ABNORMAL LOW (ref >60)
Glucose, Bld: 143 mg/dL — ABNORMAL HIGH (ref 70–99)
Potassium: 3.7 mmol/L (ref 3.5–5.1)
Sodium: 136 mmol/L (ref 135–145)
Total Bilirubin: 1.1 mg/dL (ref 0.0–1.2)
Total Protein: 5.2 g/dL — ABNORMAL LOW (ref 6.5–8.1)

## 2023-12-28 LAB — CBG MONITORING, ED
Glucose-Capillary: 197 mg/dL — ABNORMAL HIGH (ref 70–99)
Glucose-Capillary: 168 mg/dL — ABNORMAL HIGH (ref 70–99)

## 2023-12-28 LAB — CBC WITH DIFFERENTIAL/PLATELET
Abs Immature Granulocytes: 0.05 K/uL (ref 0.00–0.07)
Basophils Absolute: 0.1 K/uL (ref 0.0–0.1)
Basophils Relative: 1 %
Eosinophils Absolute: 0 K/uL (ref 0.0–0.5)
Eosinophils Relative: 0 %
HCT: 21.1 % — ABNORMAL LOW (ref 36.0–46.0)
Hemoglobin: 5.6 g/dL — CL (ref 12.0–15.0)
Immature Granulocytes: 1 %
Lymphocytes Relative: 6 %
Lymphs Abs: 0.6 K/uL — ABNORMAL LOW (ref 0.7–4.0)
MCH: 26.4 pg (ref 26.0–34.0)
MCHC: 26.5 g/dL — ABNORMAL LOW (ref 30.0–36.0)
MCV: 99.5 fL (ref 80.0–100.0)
Monocytes Absolute: 0.9 K/uL (ref 0.1–1.0)
Monocytes Relative: 9 %
Neutro Abs: 8 K/uL — ABNORMAL HIGH (ref 1.7–7.7)
Neutrophils Relative %: 83 %
Platelets: 577 K/uL — ABNORMAL HIGH (ref 150–400)
RBC: 2.12 MIL/uL — ABNORMAL LOW (ref 3.87–5.11)
RDW: 16 % — ABNORMAL HIGH (ref 11.5–15.5)
WBC: 9.6 K/uL (ref 4.0–10.5)
nRBC: 0 % (ref 0.0–0.2)

## 2023-12-28 LAB — HEMOGLOBIN AND HEMATOCRIT, BLOOD
HCT: 27.3 % — ABNORMAL LOW (ref 36.0–46.0)
HCT: 29 % — ABNORMAL LOW (ref 36.0–46.0)
Hemoglobin: 8.1 g/dL — ABNORMAL LOW (ref 12.0–15.0)
Hemoglobin: 8.8 g/dL — ABNORMAL LOW (ref 12.0–15.0)

## 2023-12-28 LAB — LIPASE, BLOOD: Lipase: 18 U/L (ref 11–51)

## 2023-12-28 LAB — RESP PANEL BY RT-PCR (RSV, FLU A&B, COVID)  RVPGX2
Influenza A by PCR: NEGATIVE
Influenza B by PCR: NEGATIVE
Resp Syncytial Virus by PCR: NEGATIVE
SARS Coronavirus 2 by RT PCR: NEGATIVE

## 2023-12-28 LAB — TROPONIN I (HIGH SENSITIVITY)
Troponin I (High Sensitivity): 300 ng/L (ref <18)
Troponin I (High Sensitivity): 297 ng/L (ref <18)

## 2023-12-28 LAB — POC OCCULT BLOOD, ED: Fecal Occult Bld: POSITIVE — AB

## 2023-12-28 LAB — HEMOGLOBIN A1C
Hgb A1c MFr Bld: 3.6 % — ABNORMAL LOW (ref 4.8–5.6)
Mean Plasma Glucose: 56.62 mg/dL

## 2023-12-28 LAB — GLUCOSE, CAPILLARY: Glucose-Capillary: 129 mg/dL — ABNORMAL HIGH (ref 70–99)

## 2023-12-28 LAB — PREPARE RBC (CROSSMATCH)

## 2023-12-28 MED ORDER — LEVOTHYROXINE SODIUM 25 MCG PO TABS
125.0000 ug | ORAL_TABLET | Freq: Every day | ORAL | Status: DC
  Administered 2023-12-29 – 2023-12-30 (×2): 125 ug via ORAL
  Filled 2023-12-28 (×2): qty 1

## 2023-12-28 MED ORDER — DEXAMETHASONE 0.5 MG PO TABS
0.5000 mg | ORAL_TABLET | Freq: Every day | ORAL | Status: DC
  Administered 2023-12-28 – 2023-12-30 (×2): 0.5 mg via ORAL
  Filled 2023-12-28 (×4): qty 1

## 2023-12-28 MED ORDER — TRIMETHOBENZAMIDE HCL 100 MG/ML IM SOLN
200.0000 mg | Freq: Four times a day (QID) | INTRAMUSCULAR | Status: DC | PRN
  Administered 2023-12-29: 200 mg via INTRAMUSCULAR
  Filled 2023-12-28 (×2): qty 2

## 2023-12-28 MED ORDER — PANTOPRAZOLE SODIUM 40 MG IV SOLR
40.0000 mg | Freq: Two times a day (BID) | INTRAVENOUS | Status: DC
Start: 2023-12-28 — End: 2023-12-30
  Administered 2023-12-28 – 2023-12-30 (×5): 40 mg via INTRAVENOUS
  Filled 2023-12-28 (×5): qty 10

## 2023-12-28 MED ORDER — INFLUENZA VAC SPLIT HIGH-DOSE 0.5 ML IM SUSY
0.5000 mL | PREFILLED_SYRINGE | INTRAMUSCULAR | Status: AC
  Administered 2023-12-30: 0.5 mL via INTRAMUSCULAR
  Filled 2023-12-28 (×2): qty 0.5

## 2023-12-28 MED ORDER — ACETAMINOPHEN 325 MG PO TABS: 650.0000 mg | ORAL_TABLET | Freq: Four times a day (QID) | ORAL | Status: DC | PRN

## 2023-12-28 MED ORDER — FERROUS SULFATE 325 (65 FE) MG PO TABS
325.0000 mg | ORAL_TABLET | Freq: Every day | ORAL | Status: DC
  Administered 2023-12-28 – 2023-12-29 (×2): 325 mg via ORAL
  Filled 2023-12-28 (×3): qty 1

## 2023-12-28 MED ORDER — ATORVASTATIN CALCIUM 40 MG PO TABS
40.0000 mg | ORAL_TABLET | Freq: Every day | ORAL | Status: DC
  Administered 2023-12-28 – 2023-12-29 (×2): 40 mg via ORAL
  Filled 2023-12-28 (×2): qty 1

## 2023-12-28 MED ORDER — CYCLOSPORINE 0.05 % OP EMUL: 1.0000 [drp] | Freq: Two times a day (BID) | OPHTHALMIC | Status: DC | PRN

## 2023-12-28 MED ORDER — SODIUM CHLORIDE 0.9% FLUSH
3.0000 mL | Freq: Two times a day (BID) | INTRAVENOUS | Status: DC
  Administered 2023-12-28 – 2023-12-30 (×5): 3 mL via INTRAVENOUS

## 2023-12-28 MED ORDER — SODIUM CHLORIDE 0.9% IV SOLUTION: Freq: Once | INTRAVENOUS | Status: AC

## 2023-12-28 MED ORDER — MIDODRINE HCL 5 MG PO TABS
5.0000 mg | ORAL_TABLET | Freq: Two times a day (BID) | ORAL | Status: DC
  Administered 2023-12-28 – 2023-12-29 (×3): 5 mg via ORAL
  Filled 2023-12-28 (×3): qty 1

## 2023-12-28 MED ORDER — INSULIN ASPART 100 UNIT/ML IJ SOLN
0.0000 [IU] | Freq: Three times a day (TID) | INTRAMUSCULAR | Status: DC
  Administered 2023-12-28 (×2): 2 [IU] via SUBCUTANEOUS
  Administered 2023-12-30: 1 [IU] via SUBCUTANEOUS

## 2023-12-28 MED ORDER — DICLOFENAC SODIUM 1 % EX GEL
4.0000 g | Freq: Four times a day (QID) | CUTANEOUS | Status: DC | PRN
  Filled 2023-12-28: qty 100

## 2023-12-28 MED ORDER — ALBUTEROL SULFATE (2.5 MG/3ML) 0.083% IN NEBU: 2.5000 mg | INHALATION_SOLUTION | Freq: Four times a day (QID) | RESPIRATORY_TRACT | Status: DC | PRN

## 2023-12-28 MED ORDER — DAPAGLIFLOZIN PROPANEDIOL 10 MG PO TABS
10.0000 mg | ORAL_TABLET | Freq: Every day | ORAL | Status: DC
  Administered 2023-12-28: 10 mg via ORAL
  Filled 2023-12-28 (×2): qty 1

## 2023-12-28 MED ORDER — ACETAMINOPHEN 650 MG RE SUPP: 650.0000 mg | Freq: Four times a day (QID) | RECTAL | Status: DC | PRN

## 2023-12-28 MED ORDER — MIDODRINE HCL 5 MG PO TABS
5.0000 mg | ORAL_TABLET | Freq: Once | ORAL | Status: AC
  Administered 2023-12-28: 5 mg via ORAL
  Filled 2023-12-28: qty 1

## 2023-12-28 NOTE — ED Notes (Signed)
 CCMD called.

## 2023-12-28 NOTE — ED Notes (Signed)
 5W notified of pts arrival

## 2023-12-28 NOTE — ED Notes (Signed)
 Pt had urinary incontinence. Peri care provided and linens changed.

## 2023-12-28 NOTE — H&P (Addendum)
 History and Physical    Patient: Tammy Good FMW:[id number] DOB: 1950/06/30 DOA: 12/28/2023 DOS: the patient was seen and examined on 12/28/2023 PCP: Nichole Senior, MD  Patient coming from: Home  Chief Complaint:  Chief Complaint  Patient presents with   Emesis   Dizziness   HPI: Tammy Good is a 73 y.o. 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  She has been experiencing nausea, weakness, and lightheadedness, particularly when standing or moving, with symptoms worsening over the past few days. Her nausea is severe and accompanied by episodes of foamy regurgitation, but there is no vomiting or hematemesis. No chest pain or discomfort is reported.  She has noticed dark stools, which she attributes to consuming prunes, prune juice, and grapes. She recently used a suppository, which she believes helped alleviate constipation but did not affect her upper gastrointestinal symptoms. She denies the use of NSAIDs such as ibuprofen , BC Goody powders, Aleve, or naproxen, but mentions using Tylenol  last week.  She is on Eliquis  and Plavix  open which she took last night.  She recalls a prior history of concern for intestinal bleeding for which she underwent endoscopy.  Endoscopy revealed esophageal ulcer and esophageal stenosis identified in March 2023, along with a hiatal hernia.  Her last colonoscopy was in June 2015, which showed small internal hemorrhoids and black debris in the colon, but no definitive source of bleeding was identified.    In the ED patient was noted to be afebrile with tachypnea and all other vital signs were maintained.  Labs significant for hemoglobin 5.6, platelets 577, BUN 14, creatinine 1.04, and high-sensitivity troponin 300.    Influenza, COVID-19, and RSV screening  were negative.  Patient was typed and screened and ordered to be transfused 2 units of packed red blood cells. Stool guaiacs were noted to be positive.  Review of Systems: As mentioned in the history of present illness. All other systems reviewed and are negative. Past Medical History:  Diagnosis Date   CAD S/P percutaneous coronary angioplasty 08/2013   100% mRCA - PCI Integrity Resolute DES 3.0 mm x 38 mm - 3.35 mm; PTCA of RPA V 2.0 mm x 15 mm   CHF (congestive heart failure) (HCC)    Cholesteatoma    right   Diabetes mellitus type 2 in obese    On insulin  and Invokana   History of osteomyelitis L 5th Toe all 05/2019   s/p Partial Ray Amputation with partial closure; 6 wks Abx & LSFA Atherectomy/DEB PTA with Stent for focal dissection.   Hyperlipidemia with target LDL less than 70    Hypothyroidism (acquired)    Mild essential hypertension    Obesity (BMI 30-39.9) 11/17/2013   PAD (peripheral artery disease) 05/26/2019   05/26/19: Abd AoGram- BLE runoff -> L SFA orbital atherectomy - PTA w/ DEB & Stent 6 x 40 Luttonix (for focal dissection) - patent Pop A with 3 V runoff. LEA Dopplers 01/03/2020: RABI (prev) 0.68 (0.53)/ RTBI (prev) 0.58 (0.33); LABI (prev) 0.80 (0.64), LTBI (prev) 0.64 (0.51); R mSFA ~50-74%, L mSFA 50-74%. Patent Prox SFA stent < 49% stenosis   Panhypopituitarism    Peripheral arterial disease    ST elevation myocardial infarction (STEMI) of inferior wall, subsequent episode of care (HCC) 08/2013   80% branch of D1, 40% mid AV groove circumflex, 100%  RCA with subacute thrombus -- thrombus extending into RPA V with 100% occlusion after initial angioplasty of mid RCA ;; Post MI ECHO 6/9/'15: EF 50-55%, mild LVH with moderate HK of inferior wall, Gr1 DD, mild LA dilation; mildly reduced RV function   Past Surgical History:  Procedure Laterality Date   ABDOMINAL AORTOGRAM N/A 09/10/2023   Procedure: ABDOMINAL AORTOGRAM;  Surgeon: Gretta Lonni PARAS, MD;  Location: [facility]  INV[facility];  Service: Cardiovascular;  Laterality: N/A;   ABDOMINAL AORTOGRAM W/LOWER EXTREMITY N/A 05/26/2019   Procedure: ABDOMINAL AORTOGRAM W/LOWER EXTREMITY;  Surgeon: Gretta Lonni PARAS, MD;  Location: [facility];  Service: Cardiovascular;  Laterality: N/A;   ABDOMINAL AORTOGRAM W/LOWER EXTREMITY N/A 11/15/2020   Procedure: ABDOMINAL AORTOGRAM W/LOWER EXTREMITY;  Surgeon: Gretta Lonni PARAS, MD;  Location: [facility];  Service: Cardiovascular;  Laterality: N/A;   ABDOMINAL AORTOGRAM W/LOWER EXTREMITY N/A 04/18/2021   Procedure: ABDOMINAL AORTOGRAM W/LOWER EXTREMITY;  Surgeon: Gretta Lonni PARAS, MD;  Location: [facility];  Service: Cardiovascular;  Laterality: N/A;   ABDOMINAL AORTOGRAM W/LOWER EXTREMITY Left 07/11/2021   Procedure: ABDOMINAL AORTOGRAM W/LOWER EXTREMITY;  Surgeon: Gretta Lonni PARAS, MD;  Location: [facility];  Service: Cardiovascular;  Laterality: Left;   ABDOMINAL AORTOGRAM W/LOWER EXTREMITY N/A 11/20/2022   Procedure: ABDOMINAL AORTOGRAM W/LOWER EXTREMITY;  Surgeon: Gretta Lonni PARAS, MD;  Location: [facility];  Service: Cardiovascular;  Laterality: N/A;   AMPUTATION Left 05/25/2019   Procedure: AMPUTATION RAY 5th;  Surgeon: Gershon Donnice SAUNDERS, DPM;  Location: [facility] OR;  Service: Podiatry;  Laterality: Left;   AMPUTATION Left 01/14/2022   Procedure: LEFT BELOW THE KNEE AMPUTATION;  Surgeon: Elsa Lonni SAUNDERS, MD;  Location: [facility] OR;  Service: Orthopedics;  Laterality: Left;  LENGTH OF SURGERY: 90 MINUTES   APPLICATION OF WOUND VAC Left 01/14/2022   Procedure: WOUND VAC PLACEMENT;  Surgeon: Elsa Lonni SAUNDERS, MD;  Location: [facility] OR;  Service: Orthopedics;  Laterality: Left;   BONE BIOPSY Left 04/24/2021   Procedure: BONE BIOPSY;  Surgeon: Burt Fus, DPM;  Location: [facility] OR;  Service: Podiatry;  Laterality: Left;  left ankle/fibula   BONE BIOPSY Left 07/15/2021   Procedure: LEFT FOOT BONE BIOPSY;  Surgeon: Gershon Donnice SAUNDERS, DPM;  Location: [facility] OR;  Service: Podiatry;  Laterality: Left;   Cardiac Event Monitor  July-August 2015   Sinus rhythm with PVCs   CHOLECYSTECTOMY     COLONOSCOPY N/A 08/31/2013   Procedure: COLONOSCOPY;  Surgeon: Renaye Sous, MD;  Location: [facility] ENDOSCOPY;  Service: Endoscopy;  Laterality: N/A;   CORONARY STENT INTERVENTION N/A 11/19/2020   Procedure: CORONARY STENT INTERVENTION;  Surgeon: Mady Lonni, MD;  [facility]: [facility];  Service: Cardiovascular;  Laterality: N/A;   ESOPHAGOGASTRODUODENOSCOPY N/A 09/01/2013   Procedure: ESOPHAGOGASTRODUODENOSCOPY (EGD);  Surgeon: Belvie JONETTA Just, MD;  Location: [facility] ENDOSCOPY;  Service: Endoscopy;  Laterality: N/A;  bedside   ESOPHAGOGASTRODUODENOSCOPY (EGD) WITH PROPOFOL  N/A 05/26/2021   Procedure: ESOPHAGOGASTRODUODENOSCOPY (EGD) WITH PROPOFOL ;  Surgeon: Just Belvie, MD;  Location: [facility] ENDOSCOPY;  Service: Gastroenterology;  Laterality: N/A;   EYE SURGERY Bilateral    bilateral cataracts   INCISION AND DRAINAGE Left 04/24/2021   Procedure: INCISION AND DRAINAGE;  Surgeon: Burt Fus, DPM;  Location: [facility] OR;  Service: Podiatry;  Laterality: Left;   IRRIGATION AND DEBRIDEMENT FOOT Left 07/15/2021   Procedure: IRRIGATION AND DEBRIDEMENT FOOT;  Surgeon: Gershon Donnice SAUNDERS, DPM;  Location: [facility] OR;  Service: Podiatry;  Laterality: Left;  LEFT HEART CATH AND CORONARY ANGIOGRAPHY N/A 11/19/2020   Procedure: LEFT HEART CATH AND CORONARY ANGIOGRAPHY;  Surgeon: Mady Bruckner, MD;  [facility]: [facility];  Service: Cardiovascular;  Laterality: N/A;   LEFT HEART CATHETERIZATION WITH CORONARY ANGIOGRAM N/A 08/30/2013   Procedure: LEFT HEART CATHETERIZATION WITH CORONARY ANGIOGRAM;  Surgeon: Alm LELON Clay, MD;  Location: [facility] CATH LAB: 100% mRCA (thrombus - extends to RPAV), 80% D1, 40% AVG Cx.   LOWER EXTREMITY ANGIOGRAPHY N/A 09/10/2023   Procedure: Lower Extremity Angiography;  Surgeon: Gretta Bruckner PARAS, MD;  Location: [facility]  INVASIVE CV LAB;  Service: Cardiovascular;  Laterality: N/A;   LOWER EXTREMITY INTERVENTION N/A 09/10/2023   Procedure: LOWER EXTREMITY INTERVENTION;  Surgeon: Gretta Bruckner PARAS, MD;  [facility]: [facility];  Service: Cardiovascular;  Laterality: N/A;   PERCUTANEOUS CORONARY STENT INTERVENTION (PCI-S)  08/30/2013   Procedure: PERCUTANEOUS CORONARY STENT INTERVENTION (PCI-S);  Surgeon: Alm LELON Clay, MD;  Location: [facility] CATH LAB;  Integrity Resolute DES 2.0 mm x 38 mm -- 3.35 mm.; PTCA of proximal RPA V. - 3.0 mm x 15 mm balloon   PERIPHERAL INTRAVASCULAR LITHOTRIPSY Right 11/20/2022   Procedure: PERIPHERAL INTRAVASCULAR LITHOTRIPSY;  Surgeon: Gretta Bruckner PARAS, MD;  [facility];  Service: Cardiovascular;  Laterality: Right;  POP   PERIPHERAL VASCULAR ATHERECTOMY  05/26/2019   Procedure: PERIPHERAL VASCULAR ATHERECTOMY;  Surgeon: Gretta Bruckner PARAS, MD;  [facility]: [facility];  Service: Cardiovascular;;  Left SFA   PERIPHERAL VASCULAR BALLOON ANGIOPLASTY Left 07/11/2021   Procedure: PERIPHERAL VASCULAR BALLOON ANGIOPLASTY;  Surgeon: Gretta Bruckner PARAS, MD;  [facility]: [facility];  Service: Cardiovascular;  Laterality: Left;  PT TRUNK / AT   PERIPHERAL VASCULAR BALLOON ANGIOPLASTY Right 11/20/2022   Procedure: PERIPHERAL VASCULAR BALLOON ANGIOPLASTY;  Surgeon: Gretta Bruckner PARAS, MD;  Location: [facility];  Service: Cardiovascular;  Laterality: Right;  POP   PERIPHERAL VASCULAR INTERVENTION  05/26/2019   Procedure: PERIPHERAL VASCULAR INTERVENTION;  Surgeon: Gretta Bruckner PARAS, MD;  [facility];  Service: Cardiovascular;;  Left SFA   PERIPHERAL VASCULAR INTERVENTION Right 11/15/2020   Procedure: PERIPHERAL VASCULAR INTERVENTION;  Surgeon: Gretta Bruckner PARAS, MD;  [facility]: [facility];  Service: Cardiovascular;  Laterality: Right;  Superficial Femoral Artery   PERIPHERAL VASCULAR INTERVENTION Left 07/11/2021    Procedure: PERIPHERAL VASCULAR INTERVENTION;  Surgeon: Gretta Bruckner PARAS, MD;  Location: [facility];  Service: Cardiovascular;  Laterality: Left;  SFA   PITUITARY SURGERY     TEE WITHOUT CARDIOVERSION N/A 04/26/2021   Procedure: TRANSESOPHAGEAL ECHOCARDIOGRAM (TEE);  Surgeon: Bruckner Slain, MD;  Location: [facility] ENDOSCOPY;  Service: Cardiovascular;  Laterality: N/A;   TRANSTHORACIC ECHOCARDIOGRAM  08/30/2013   mild LVH. EF 50-55%. Moderate HK of the entire inferior myocardium. GR 1 DD. Mild LA dilation. Mildly reduced RV function   TYMPANOMASTOIDECTOMY Right 12/28/2017   Procedure: RIGHT TYMPANOMASTOIDECTOMY;  Surgeon: Karis Clunes, MD;  Location: [facility];  Service: ENT;  Laterality: Right;   Social History:  reports that she has quit smoking. She has never been exposed to tobacco smoke. She has never used smokeless tobacco. She reports that she does not drink alcohol  and does not use drugs.  Allergies  Allergen Reactions   Strawberry Extract Anaphylaxis, Itching and Swelling    Mouth swells and gets itchy    Family History  Problem Relation Age of Onset   Cancer Mother 70       multiple  myeloma   Heart attack Father 37   Cancer Sister    Alzheimer's disease Maternal Grandmother     Prior to Admission medications   Medication Sig Start Date End Date Taking? Authorizing Provider  apixaban  (ELIQUIS ) 5 MG TABS tablet TAKE 1 TABLET BY MOUTH TWICE A DAY 09/17/23  Yes Anner Alm ORN, MD  atorvastatin  (LIPITOR) 40 MG tablet Take 1 tablet (40 mg total) by mouth daily at 6 PM. 09/03/13  Yes Danton Reyes DASEN, MD  clopidogrel  (PLAVIX ) 75 MG tablet TAKE 1 TABLET BY MOUTH DAILY (MUST HAVE APPOINTMENT FOR FUTURE REFILLS) 12/08/23  Yes Anner Alm ORN, MD  Continuous Blood Gluc Sensor (FREESTYLE LIBRE 2 SENSOR) MISC Apply topically every 14 (fourteen) days. 06/12/20  Yes [provider]  cycloSPORINE (RESTASIS) 0.05 % ophthalmic emulsion Place 1 drop into both  eyes 2 (two) times daily as needed (dry eyes).   Yes [provider]  dapagliflozin  propanediol (FARXIGA ) 10 MG TABS tablet Take 10 mg by mouth daily.   Yes [provider]  dexamethasone  (DECADRON ) 0.5 MG tablet Take 0.5 mg by mouth daily. 08/21/23  Yes [provider]  diclofenac  Sodium (VOLTAREN ) 1 % GEL Apply 4 g topically 4 (four) times daily. Patient taking differently: Apply 4 g topically 4 (four) times daily as needed (knee pain). 01/30/22  Yes Love, Sharlet RAMAN, PA-C  ferrous sulfate  325 (65 FE) MG tablet Take 325 mg by mouth at bedtime.   Yes [provider]  insulin  aspart (NOVOLOG  FLEXPEN) 100 UNIT/ML FlexPen Inject 2-10 Units into the skin 3 (three) times daily with meals. Sliding scale insulin    Yes [provider]  Insulin  Glargine (BASAGLAR  KWIKPEN) 100 UNIT/ML Inject 24 Units into the skin at bedtime. Patient taking differently: Inject 42 Units into the skin at bedtime. 01/30/22  Yes Love, Sharlet RAMAN, PA-C  levothyroxine  (SYNTHROID ) 125 MCG tablet Take 1 tablet (125 mcg total) by mouth daily before breakfast. 11/21/20  Yes Baglia, Corrina, PA-C  midodrine  (PROAMATINE ) 5 MG tablet Take 1 tablet (5 mg total) by mouth 2 (two) times daily with breakfast and lunch. Patient taking differently: Take 5 mg by mouth 2 (two) times daily with a meal. Lunch and Dinner 01/31/22  Yes Love, Sharlet RAMAN, PA-C  nitroGLYCERIN  (NITROSTAT ) 0.4 MG SL tablet Place 0.4 mg under the tongue every 5 (five) minutes as needed for chest pain. 08/23/21  Yes [provider]  pantoprazole  (PROTONIX ) 40 MG tablet Take 40 mg by mouth 2 (two) times daily.   Yes [provider]  polyethylene glycol powder (GLYCOLAX /MIRALAX ) 17 GM/SCOOP powder Take 1 capful with water (17 g) by mouth daily. 06/04/21  Yes Rai, Ripudeep K, MD  pregabalin (LYRICA) 50 MG capsule Take 50 mg by mouth daily. 10/29/23  Yes [provider]  B-D UF III MINI PEN NEEDLES 31G X 5 MM MISC Inject  into the skin. 05/28/20   [provider]    Physical Exam: Vitals:   12/28/23 0606 12/28/23 0615 12/28/23 0709 12/28/23 0710  BP:  116/63    Pulse: 90 92    Resp:  (!) 26    Temp: 98.1 F (36.7 C)     TempSrc: Oral     SpO2:  97% 100%   Weight:    99.3 kg  Height:    5' 7 (1.702 m)    Constitutional: Elderly female current NAD, calm, comfortable Eyes: PERRL, lids and conjunctivae normal ENMT: Mucous membranes are moist. Posterior pharynx clear of any  exudate or lesions.Normal dentition.  Neck: normal, supple  Respiratory: clear to auscultation bilaterally, no wheezing, no crackles. Normal respiratory effort. No accessory muscle use.  Cardiovascular: Regular rate and rhythm, no murmurs / rubs / gallops. No extremity edema.   Abdomen: no tenderness, no masses palpated.  Bowel sounds positive.  Musculoskeletal: no clubbing / cyanosis.  Left below-knee amputation present. Skin: no rashes, lesions, ulcers. No induration Neurologic: CN 2-12 grossly intact.  Strength 5/5 in all 4.  Psychiatric: Normal judgment and insight. Alert and oriented x 3. Normal mood.   Data Reviewed:  EKG reveals sinus tachycardia 101 bpm with QTc 507.  Reviewed labs, imaging, and pertinent records as documented.  Assessment and Plan:  Symptomatic anemia secondary to GI bleed Patient presents with complaints of lightheadedness, nausea, and reports of dark stool.  Stool guaiacs were noted to be positive.  Denies any NSAID use, but is on Eliquis  and Plavix .  Patient with a prior history of concern for bleeding for which she underwent endoscopy in 05/2021 noted esophageal ulcer without stigmata of bleeding.  Patient was typed and screened and ordered 2 units of packed red blood cells.    - Admit to a progressive bed - Clear liquid diet - Continue with transfusion of 2 units of packed red blood cells - Serial monitoring of H&H - Dr. Kristie of GI consulted, will follow-up for further  recommendations  Elevated troponin CAD Acute. High sensitive troponin 300-> 297.  Patient without complaints of chest pain at this time.  Last heart cath was in 11/19/2020 and noted severe two-vessel coronary artery disease including 95% proximal LCx stenosis and multifocal D1 disease of up to 90%, mild to moderate LAD and RCA disease, and patent RCA stent. Had successful PCI to proximal left circumflex, but medical management was recommended for the diagonal disease as vessel was too small for PCI.  Thought likely secondary to demand. - Continue to monitor  Diabetes mellitus type 2, with long-term use of insulin  Last available hemoglobin A1c noted to be 4.9 over a year ago.   - Hypoglycemia protocols - Add-on hemoglobin A1c - Continue Farxiga  - CBGs before every meal with sensitive SSI  Prolonged QT interval QTc noted to be prolonged at 507. - Correct electrolyte abnormalities - Avoid QT prolonging medications  Renal insufficiency Creatinine noted to be 1.04 BUN 14.  Baseline creatinine previous noted to be around 0.8 - Continue to monitor  Paroxysmal atrial fibrillation on chronic anticoagulation Patient appears to be in a sinus rhythm at this time.  Last dose of Eliquis  yesterday evening. - Hold Eliquis   Systolic congestive heart failure Patient currently appears to be euvolemic.  Last EF noted to be 40-45% with indeterminate diastolic parameters. - Strict I&Os and daily weights  Peripheral artery disease Status post left BKA Patient is followed by vascular surgery with last intervention - Held Plavix  in acute setting.  Resume.  Hyperlipidemia - Continue atorvastatin   Panhypopituitarism  - Continue levothyroxine  and Decadron   Obesity BMI 34.3 kg/m  DVT prophylaxis: SCDs  Advance Care Planning:   Code Status: Full Code   Consults: Dr. Kristie of gastroenterology  Family Communication:   Severity of Illness: The appropriate patient status for this patient is  OBSERVATION. Observation status is judged to be reasonable and necessary in order to provide the required intensity of service to ensure the patient's safety. The patient's presenting symptoms, physical exam findings, and initial radiographic and laboratory data in the context of their medical condition is felt to  place them at decreased risk for further clinical deterioration. Furthermore, it is anticipated that the patient will be medically stable for discharge from the hospital within 2 midnights of admission.   Author: Maximino DELENA Sharps, MD 12/28/2023 8:34 AM  For on call review [email or web address].

## 2023-12-28 NOTE — ED Notes (Signed)
 CCMD called, to initiate pt monitoring.

## 2023-12-28 NOTE — ED Notes (Signed)
 1st unit completed without complications, prepare to give 2nd unit as ordered

## 2023-12-28 NOTE — ED Notes (Signed)
 Remained with pt for 15 minutes post transfusion. No signs or symptoms of transfusion reaction observed. VSS see flowsheet for vitals.

## 2023-12-28 NOTE — ED Provider Notes (Signed)
 Patient's care assumed by me at 6:30 AM.  Patient's laboratory evaluations returned patient's hemoglobin is 5.6.  Patient's first troponin is 300.  Type and screen ordered I discussed blood transfusion with patient.  She has had previous blood transfusion and she is agreeable to transfusion.  Hospitalist consulted for admission.   Flint Sonny POUR, PA-C 12/28/23 0834    Emil Share, DO 12/28/23 [phone]

## 2023-12-28 NOTE — ED Notes (Signed)
 Messaged pharm to send upcoming medication awaiting arrival

## 2023-12-28 NOTE — ED Notes (Signed)
 Pt brief changed, urine occurrence, pt adjusted in bed

## 2023-12-28 NOTE — ED Provider Notes (Signed)
 [place] EMERGENCY DEPARTMENT AT [facility] Provider Note   CSN: [id number] Arrival date & time: 12/28/23  9453     Patient presents with: Emesis and Dizziness   Tammy Good is a 73 y.o. female who presents via EMS with a few days of nausea vomiting and lightheadedness which is exacerbated with change in position.  No dizziness like the room spinning.  No known ill contacts.  NBNB emesis.  She has been constipated for which she has been taking stool softeners and large amounts of prune juice with small bowel movement yesterday.  No abdominal pain, continues to pass flatus.  Denies urinary symptoms.  No fevers or chills at home though she did experience a brief episode of left-sided chest pain that did not radiate and was not exertional.  History of CAD, NSTEMI, PAD status post left BKA, DMT2, CHFWith EF of 40 to 45% on TEE in 2023.  Anticoagulated on Eliquis  and plavix , denies missing any doses.   HPI     Prior to Admission medications   Medication Sig Start Date End Date Taking? Authorizing Provider  apixaban  (ELIQUIS ) 5 MG TABS tablet TAKE 1 TABLET BY MOUTH TWICE A DAY 09/17/23   Anner Alm ORN, MD  atorvastatin  (LIPITOR) 40 MG tablet Take 1 tablet (40 mg total) by mouth daily at 6 PM. 09/03/13   Danton, Reyes DASEN, MD  B-D UF III MINI PEN NEEDLES 31G X 5 MM MISC Inject into the skin. 05/28/20   [provider]  clopidogrel  (PLAVIX ) 75 MG tablet TAKE 1 TABLET BY MOUTH DAILY (MUST HAVE APPOINTMENT FOR FUTURE REFILLS) 12/08/23   Anner Alm ORN, MD  Continuous Blood Gluc Sensor (FREESTYLE LIBRE 2 SENSOR) MISC Apply topically every 14 (fourteen) days. 06/12/20   [provider]  cycloSPORINE (RESTASIS) 0.05 % ophthalmic emulsion Place 1 drop into both eyes 2 (two) times daily as needed (dry eyes).    [provider]  dapagliflozin  propanediol (FARXIGA ) 10 MG TABS tablet Take 10 mg by mouth daily.    [provider]  dexamethasone   (DECADRON ) 0.5 MG tablet Take 0.5 mg by mouth daily. 08/21/23   [provider]  diclofenac  Sodium (VOLTAREN ) 1 % GEL Apply 4 g topically 4 (four) times daily. Patient taking differently: Apply 4 g topically 4 (four) times daily as needed (knee pain). 01/30/22   Love, Sharlet GORMAN, PA-C  ferrous sulfate  325 (65 FE) MG tablet Take 325 mg by mouth at bedtime.    [provider]  gabapentin  (NEURONTIN ) 100 MG capsule Take 1 capsule (100 mg total) by mouth at bedtime. 01/29/23   Gershon Donnice SAUNDERS, DPM  insulin  aspart (NOVOLOG  FLEXPEN) 100 UNIT/ML FlexPen Inject 2-10 Units into the skin 3 (three) times daily with meals. Sliding scale insulin     [provider]  Insulin  Glargine (BASAGLAR  KWIKPEN) 100 UNIT/ML Inject 24 Units into the skin at bedtime. Patient taking differently: Inject 18 Units into the skin at bedtime. 01/30/22   Love, Sharlet GORMAN, PA-C  levothyroxine  (SYNTHROID ) 125 MCG tablet Take 1 tablet (125 mcg total) by mouth daily before breakfast. 11/21/20   Baglia, Corrina, PA-C  midodrine  (PROAMATINE ) 5 MG tablet Take 1 tablet (5 mg total) by mouth 2 (two) times daily with breakfast and lunch. Patient taking differently: Take 5 mg by mouth 2 (two) times daily with a meal. Lunch and Dinner 01/31/22   Love, Sharlet GORMAN, PA-C  nitroGLYCERIN  (NITROSTAT ) 0.4 MG SL tablet Place 0.4 mg under the tongue every  5 (five) minutes as needed for chest pain. 08/23/21   [provider]  pantoprazole  (PROTONIX ) 40 MG tablet Take 40 mg by mouth daily.    [provider]  polyethylene glycol powder (GLYCOLAX /MIRALAX ) 17 GM/SCOOP powder Take 1 capful with water (17 g) by mouth daily. Patient taking differently: Take 17 g by mouth daily as needed (constipation.). 06/04/21   Rai, Nydia POUR, MD  Propylene Glycol (SYSTANE COMPLETE) 0.6 % SOLN Place 1 drop into both eyes 2 (two) times daily as needed (dry/irritated eyes.).    [provider]    Allergies: Strawberry extract     Review of Systems  Constitutional:  Positive for appetite change and fatigue.  Respiratory: Negative.    Cardiovascular:  Positive for chest pain. Negative for palpitations and leg swelling.  Gastrointestinal:  Positive for constipation, nausea and vomiting.  Genitourinary: Negative.   Neurological:  Positive for light-headedness.    Updated Vital Signs BP 116/63   Pulse 92   Temp 98.1 F (36.7 C) (Oral)   Resp (!) 26   Wt 98 kg   SpO2 (!) 22%   BMI 33.84 kg/m   Physical Exam Vitals and nursing note reviewed.  Constitutional:      Appearance: She is obese. She is not ill-appearing or toxic-appearing.  HENT:     Head: Normocephalic and atraumatic.     Mouth/Throat:     Mouth: Mucous membranes are moist.     Pharynx: No oropharyngeal exudate or posterior oropharyngeal erythema.  Eyes:     General:        Right eye: No discharge.        Left eye: No discharge.     Conjunctiva/sclera: Conjunctivae normal.     Pupils: Pupils are equal, round, and reactive to light.  Cardiovascular:     Rate and Rhythm: Normal rate and regular rhythm.     Pulses: Normal pulses.  Pulmonary:     Effort: Pulmonary effort is normal. No respiratory distress.     Breath sounds: Normal breath sounds. No wheezing or rales.  Abdominal:     General: Bowel sounds are normal. There is no distension.     Palpations: Abdomen is soft.     Tenderness: There is no abdominal tenderness. There is no right CVA tenderness, left CVA tenderness, guarding or rebound.  Musculoskeletal:        General: No deformity.     Cervical back: Neck supple.     Right lower leg: No edema.     Left Lower Extremity: Left leg is amputated below knee.  Skin:    General: Skin is warm and dry.     Capillary Refill: Capillary refill takes less than 2 seconds.  Neurological:     General: No focal deficit present.     Mental Status: She is alert and oriented to person, place, and time. Mental status is at baseline.   Psychiatric:        Mood and Affect: Mood normal.     (all labs ordered are listed, but only abnormal results are displayed) Labs Reviewed  RESP PANEL BY RT-PCR (RSV, FLU A&B, COVID)  [id number]  CBC WITH DIFFERENTIAL/PLATELET  COMPREHENSIVE METABOLIC PANEL WITH GFR  LIPASE, BLOOD  TROPONIN I (HIGH SENSITIVITY)    EKG: None  Radiology: No results found.   Procedures   Medications Ordered in the ED - No data to display  Clinical Course as of 12/28/23 0636  Mon Dec 28, 2023  9364 Critical hbg  5.6.  [RS]    Clinical Course User Index [RS] Annalyce Lanpher, Pleasant SAUNDERS, PA-C                                 Medical Decision Making 73 y/o female with N/V, lightheadedness.   VS reassuring on intake, cardiopulmonary and abdominal exams are benign. GCS 15, A&Ox4.   DDX is broad and includes but is not limited to gastroenteritis, SBO, fecal impaction, ACS, PE, dysrhythmia.   Amount and/or Complexity of Data Reviewed Labs: ordered. Radiology: ordered.   Care of this patient signed out to oncoming ED provider K. Sofia, PA-C at time of shift change. All pertinent HPI, physical exam, and laboratory findings were discussed with them prior to my departure. Disposition of patient pending completion of workup, reevaluation, and clinical judgement of oncoming ED provider.   This chart was dictated using voice recognition software, Dragon. Despite the best efforts of this provider to proofread and correct errors, errors may still occur which can change documentation meaning.      Final diagnoses:  None    ED Discharge Orders     None          Bobette Pleasant SAUNDERS DEVONNA 12/28/23 9380    Trine Raynell Moder, MD 12/28/23 [phone]

## 2023-12-28 NOTE — Consult Note (Signed)
 Reason for Consult: Severe anemia with guaiac positive stools. Referring Physician: ER MD  Tammy Good is an 73 y.o. female.  HPI: Tammy Good is a 73 year old black female with multiple medical problems listed below presented to the emergency room via EMS for severe nausea with vomiting lightheadedness that she been experiencing the last few days. She claims she has been constipated and has been trying prune juice and prunes along with laxatives to facilitate a BM. She has noticed some dark stools but denies having any rectal bleeding.  She denies having any abdominal pain. She has a known history of an esophageal ulcer along with a hiatal hernia and esophageal stenosis diagnosed on an upper endoscopy in 2023; she had a colonoscopy in December 2017 when a small polyp was removed. She uses nonsteroidals very rarely about once or twice a month for headaches. She is on Eliquis  and Plavix  as she has had a stent placed in 2015 has atrial fibrillation and peripheral arterial disease. She has been on iron supplements for a while.  She denies having any fresh blood or bile in the vomitus. She has had some chest discomfort with the vomitus in the retrosternal area as well. There is no history of syncope or near syncope. Her vital signs were stable on presentation in the emergency room except for tachypnea and she was noted to have a hemoglobin of 5.6 g/dL with a platelet count of 577K and an MCV of 99.5.  Her troponins were high at 300 lipase was normal at 18 with AST of 16 ALT of 13 and a total protein of 5.2 total bili was normal at 1.1 with an albumin  of 2.7.  Past Medical History:  Diagnosis Date   CAD S/P percutaneous coronary angioplasty 08/2013   100% mRCA - PCI Integrity Resolute DES 3.0 mm x 38 mm - 3.35 mm; PTCA of RPA V 2.0 mm x 15 mm   CHF (congestive heart failure) (HCC)    Aortic atherosclerosis    Hepatic hemangioma    Cholesteatoma    right   Diabetes mellitus type 2 in obese    On  insulin  and Invokana   History of osteomyelitis L 5th Toe all 05/2019   s/p Partial Ray Amputation with partial closure; 6 wks Abx & LSFA Atherectomy/DEB PTA with Stent for focal dissection.   Hyperlipidemia with target LDL less than 70    Hypothyroidism (acquired)    Mild essential hypertension    Chronic constipation    Obesity (BMI 30-39.9) 11/17/2013   PAD (peripheral artery disease) 05/26/2019   05/26/19: Abd AoGram- BLE runoff -> L SFA orbital atherectomy - PTA w/ DEB & Stent 6 x 40 Luttonix (for focal dissection) - patent Pop A with 3 V runoff. LEA Dopplers 01/03/2020: RABI (prev) 0.68 (0.53)/ RTBI (prev) 0.58 (0.33); LABI (prev) 0.80 (0.64), LTBI (prev) 0.64 (0.51); R mSFA ~50-74%, L mSFA 50-74%. Patent Prox SFA stent < 49% stenosis   Panhypopituitarism    Peripheral arterial disease    ST elevation myocardial infarction (STEMI) of inferior wall, subsequent episode of care (HCC) 08/2013   80% branch of D1, 40% mid AV groove circumflex, 100% RCA with subacute thrombus -- thrombus extending into RPA V with 100% occlusion after initial angioplasty of mid RCA ;; Post MI ECHO 6/9/'15: EF 50-55%, mild LVH with moderate HK of inferior wall, Gr1 DD, mild LA dilation; mildly reduced RV function   Past Surgical History:  Procedure Laterality Date   ABDOMINAL AORTOGRAM N/A 09/10/2023  Procedure: ABDOMINAL AORTOGRAM;  Surgeon: Gretta Lonni PARAS, MD;  Location: [facility] INVASIVE CV LAB;  Service: Cardiovascular;  Laterality: N/A;   ABDOMINAL AORTOGRAM W/LOWER EXTREMITY N/A 05/26/2019   Procedure: ABDOMINAL AORTOGRAM W/LOWER EXTREMITY;  Surgeon: Gretta Lonni PARAS, MD;  [facility]: [facility] INVASIVE CV LAB;  Service: Cardiovascular;  Laterality: N/A;   ABDOMINAL AORTOGRAM W/LOWER EXTREMITY N/A 11/15/2020   Procedure: ABDOMINAL AORTOGRAM W/LOWER EXTREMITY;  Surgeon: Gretta Lonni PARAS, MD;  Location: [facility] INVASIVE CV LAB;  Service: Cardiovascular;  Laterality: N/A;   ABDOMINAL AORTOGRAM W/LOWER EXTREMITY N/A  04/18/2021   Procedure: ABDOMINAL AORTOGRAM W/LOWER EXTREMITY;  Surgeon: Gretta Lonni PARAS, MD;  [facility]: [facility] INVASIVE CV LAB;  Service: Cardiovascular;  Laterality: N/A;   ABDOMINAL AORTOGRAM W/LOWER EXTREMITY Left 07/11/2021   Procedure: ABDOMINAL AORTOGRAM W/LOWER EXTREMITY;  Surgeon: Gretta Lonni PARAS, MD;  [facility] CV LAB;  Service: Cardiovascular;  Laterality: Left;   ABDOMINAL AORTOGRAM W/LOWER EXTREMITY N/A 11/20/2022   Procedure: ABDOMINAL AORTOGRAM W/LOWER EXTREMITY;  Surgeon: Gretta Lonni PARAS, MD;  [facility] CV LAB;  Service: Cardiovascular;  Laterality: N/A;   AMPUTATION Left 05/25/2019   Procedure: AMPUTATION RAY 5th;  Surgeon: Gershon Donnice SAUNDERS, DPM;  [facility] OR;  Service: Podiatry;  Laterality: Left;   AMPUTATION Left 01/14/2022   Procedure: LEFT BELOW THE KNEE AMPUTATION;  Surgeon: Elsa Lonni SAUNDERS, MD;  Location: [facility] OR;  Service: Orthopedics;  Laterality: Left;  LENGTH OF SURGERY: 90 MINUTES   APPLICATION OF WOUND VAC Left 01/14/2022   Procedure: WOUND VAC PLACEMENT;  Surgeon: Elsa Lonni SAUNDERS, MD;  Location: [facility] OR;  Service: Orthopedics;  Laterality: Left;   BONE BIOPSY Left 04/24/2021   Procedure: BONE BIOPSY;  Surgeon: Burt Fus, DPM;  [facility]: [facility] OR;  Service: Podiatry;  Laterality: Left;  left ankle/fibula   BONE BIOPSY Left 07/15/2021   Procedure: LEFT FOOT BONE BIOPSY;  Surgeon: Gershon Donnice SAUNDERS, DPM;  [facility]: [facility] OR;  Service: Podiatry;  Laterality: Left;   Cardiac Event Monitor  July-August 2015   Sinus rhythm with PVCs   CHOLECYSTECTOMY     COLONOSCOPY N/A 08/31/2013   Procedure: COLONOSCOPY;  Surgeon: Renaye Sous, MD;  Location: [facility] ENDOSCOPY;  Service: Endoscopy;  Laterality: N/A;   CORONARY STENT INTERVENTION N/A 11/19/2020   Procedure: CORONARY STENT INTERVENTION;  Surgeon: Mady Lonni, MD;  [facility] INVAS[facility] CV LAB;  Service: Cardiovascular;  Laterality: N/A;   ESOPHAGOGASTRODUODENOSCOPY N/A  09/01/2013   Procedure: ESOPHAGOGASTRODUODENOSCOPY (EGD);  Surgeon: Belvie JONETTA Just, MD;  Location: [facility] ENDOSCOPY;  Service: Endoscopy;  Laterality: N/A;  bedside   ESOPHAGOGASTRODUODENOSCOPY (EGD) WITH PROPOFOL  N/A 05/26/2021   Procedure: ESOPHAGOGASTRODUODENOSCOPY (EGD) WITH PROPOFOL ;  Surgeon: Just Belvie, MD;  Location: [facility] ENDOSCOPY;  Service: Gastroenterology;  Laterality: N/A;   EYE SURGERY Bilateral    bilateral cataracts   INCISION AND DRAINAGE Left 04/24/2021   Procedure: INCISION AND DRAINAGE;  Surgeon: Burt Fus, DPM;  [facility];  Service: Podiatry;  Laterality: Left;   IRRIGATION AND DEBRIDEMENT FOOT Left 07/15/2021   Procedure: IRRIGATION AND DEBRIDEMENT FOOT;  Surgeon: Gershon Donnice SAUNDERS, DPM;  [facility] OR;  Service: Podiatry;  Laterality: Left;   LEFT HEART CATH AND CORONARY ANGIOGRAPHY N/A 11/19/2020   Procedure: LEFT HEART CATH AND CORONARY ANGIOGRAPHY;  Surgeon: Mady Lonni, MD;  [facility] CV LAB;  Service: Cardiovascular;  Laterality: N/A;   LEFT HEART CATHETERIZATION WITH CORONARY ANGIOGRAM N/A 08/30/2013   Procedure: LEFT HEART CATHETERIZATION WITH CORONARY ANGIOGRAM;  Surgeon: Alm LELON Clay, MD;  [facility] CATH LAB: 100% mRCA (thrombus - extends to RPAV), 80% D1, 40% AVG Cx.   LOWER EXTREMITY ANGIOGRAPHY N/A 09/10/2023   Procedure: Lower Extremity Angiography;  Surgeon: Gretta Lonni PARAS, MD;  Location: [facility] INVASIVE CV LAB;  Service: Cardiovascular;  Laterality: N/A;   LOWER EXTREMITY INTERVENTION N/A 09/10/2023   Procedure: LOWER EXTREMITY INTERVENTION;  Surgeon: Gretta Lonni PARAS, MD;  [facility] CV LAB;  Service: Cardiovascular;  Laterality: N/A;   PERCUTANEOUS CORONARY STENT INTERVENTION (PCI-S)  08/30/2013   Procedure: PERCUTANEOUS CORONARY STENT INTERVENTION (PCI-S);  Surgeon: Alm LELON Clay, MD;  Location: [facility] CATH LAB;  Integrity Resolute DES 2.0 mm x 38 mm -- 3.35 mm.; PTCA of proximal RPA V. - 3.0 mm x 15 mm  balloon   PERIPHERAL INTRAVASCULAR LITHOTRIPSY Right 11/20/2022   Procedure: PERIPHERAL INTRAVASCULAR LITHOTRIPSY;  Surgeon: Gretta Lonni PARAS, MD;  [facility] CV LAB;  Service: Cardiovascular;  Laterality: Right;  POP   PERIPHERAL VASCULAR ATHERECTOMY  05/26/2019   Procedure: PERIPHERAL VASCULAR ATHERECTOMY;  Surgeon: Gretta Lonni PARAS, MD;  [facility] CV LAB;  Service: Cardiovascular;;  Left SFA   PERIPHERAL VASCULAR BALLOON ANGIOPLASTY Left 07/11/2021   Procedure: PERIPHERAL VASCULAR BALLOON ANGIOPLASTY;  Surgeon: Gretta Lonni PARAS, MD;  [facility] CV LAB;  Service: Cardiovascular;  Laterality: Left;  PT TRUNK / AT   PERIPHERAL VASCULAR BALLOON ANGIOPLASTY Right 11/20/2022   Procedure: PERIPHERAL VASCULAR BALLOON ANGIOPLASTY;  Surgeon: Gretta Lonni PARAS, MD;  [facility] LAB;  Service: Cardiovascular;  Laterality: Right;  POP   PERIPHERAL VASCULAR INTERVENTION  05/26/2019   Procedure: PERIPHERAL VASCULAR INTERVENTION;  Surgeon: Gretta Lonni PARAS, MD;  [facility];  Service: Cardiovascular;;  Left SFA   PERIPHERAL VASCULAR INTERVENTION Right 11/15/2020   Procedure: PERIPHERAL VASCULAR INTERVENTION;  Surgeon: Gretta Lonni PARAS, MD;  [facility];  Service: Cardiovascular;  Laterality: Right;  Superficial Femoral Artery   PERIPHERAL VASCULAR INTERVENTION Left 07/11/2021   Procedure: PERIPHERAL VASCULAR INTERVENTION;  Surgeon: Gretta Lonni PARAS, MD;  [facility] CV LAB;  Service: Cardiovascular;  Laterality: Left;  SFA   PITUITARY SURGERY     TEE WITHOUT CARDIOVERSION N/A 04/26/2021   Procedure: TRANSESOPHAGEAL ECHOCARDIOGRAM (TEE);  Surgeon: Lonni Slain, MD;  Location: [facility] ENDOSCOPY;  Service: Cardiovascular;  Laterality: N/A;   TRANSTHORACIC ECHOCARDIOGRAM  08/30/2013   mild LVH. EF 50-55%. Moderate HK of the entire inferior myocardium. GR 1 DD. Mild LA dilation. Mildly reduced RV  function   TYMPANOMASTOIDECTOMY Right 12/28/2017   Procedure: RIGHT TYMPANOMASTOIDECTOMY;  Surgeon: Karis Clunes, MD;  Location: [facility];  Service: ENT;  Laterality: Right;   Family History  Problem Relation Age of Onset   Cancer Mother 53       multiple myeloma   Heart attack Father 44   Cancer Sister    Alzheimer's disease Maternal Grandmother    Social History:  reports that she has quit smoking. She has never been exposed to tobacco smoke. She has never used smokeless tobacco. She reports that she does not drink alcohol  and does not use drugs.  Allergies:  Allergies  Allergen Reactions   Strawberry Extract Anaphylaxis, Itching and Swelling    Mouth swells and gets itchy   Medications: I have reviewed the patient's current medications. Prior to Admission: (Not in a hospital admission)  Scheduled:  atorvastatin   40 mg Oral q1800   dapagliflozin  propanediol  10 mg Oral Daily   dexamethasone   0.5 mg Oral Daily   ferrous sulfate   325 mg Oral QHS   insulin  aspart  0-9 Units Subcutaneous TID WC   [START ON 12/29/2023] levothyroxine   125 mcg Oral QAC breakfast   midodrine   5 mg Oral BID AC   pantoprazole  (PROTONIX ) IV  40 mg Intravenous Q12H   sodium chloride  flush  3 mL Intravenous Q12H   Continuous: PRN:acetaminophen  **OR** acetaminophen , albuterol , cycloSPORINE, diclofenac  Sodium, trimethobenzamide  Results for orders placed or performed during the hospital encounter of 12/28/23 (from the past 48 hours)  CBC with Differential     Status: Abnormal   Collection Time: 12/28/23  6:00 AM  Result Value Ref Range   WBC 9.6 4.0 - 10.5 K/uL   RBC 2.12 (L) 3.87 - 5.11 MIL/uL   Hemoglobin 5.6 (LL) 12.0 - 15.0 g/dL    Comment: REPEATED TO VERIFY This critical result has been called to T. YOUNG RN by Ronal Rumalda Chang on 12/28/2023 06:32:51, and has been read back.    HCT 21.1 (L) 36.0 - 46.0 %   MCV 99.5 80.0 - 100.0 fL   MCH 26.4 26.0 - 34.0 pg   MCHC 26.5 (L) 30.0 -  36.0 g/dL   RDW 83.9 (H) 88.4 - 84.4 %   Platelets 577 (H) 150 - 400 K/uL   nRBC 0.0 0.0 - 0.2 %   Neutrophils Relative % 83 %   Neutro Abs 8.0 (H) 1.7 - 7.7 K/uL   Lymphocytes Relative 6 %   Lymphs Abs 0.6 (L) 0.7 - 4.0 K/uL   Monocytes Relative 9 %   Monocytes Absolute 0.9 0.1 - 1.0 K/uL   Eosinophils Relative 0 %   Eosinophils Absolute 0.0 0.0 - 0.5 K/uL   Basophils Relative 1 %   Basophils Absolute 0.1 0.0 - 0.1 K/uL   Immature Granulocytes 1 %   Abs Immature Granulocytes 0.05 0.00 - 0.07 K/uL    Comment: Performed at [facility], [address], [place], [place] [address]  Comprehensive metabolic panel     Status: Abnormal   Collection Time: 12/28/23  6:00 AM  Result Value Ref Range   Sodium 136 135 - 145 mmol/L   Potassium 3.7 3.5 - 5.1 mmol/L   Chloride 104 98 - 111 mmol/L   CO2 23 22 - 32 mmol/L   Glucose, Bld 143 (H) 70 - 99 mg/dL    Comment: Glucose reference range applies only to samples taken after fasting for at least 8 hours.   BUN 14 8 - 23 mg/dL   Creatinine, Ser 8.95 (H) 0.44 - 1.00 mg/dL   Calcium  8.3 (L) 8.9 - 10.3 mg/dL   Total Protein 5.2 (L) 6.5 - 8.1 g/dL   Albumin  2.7 (L) 3.5 - 5.0 g/dL   AST 16 15 - 41 U/L   ALT 13 0 - 44 U/L   Alkaline Phosphatase 78 38 - 126 U/L   Total Bilirubin 1.1 0.0 - 1.2 mg/dL   GFR, Estimated 57 (L) >60 mL/min    Comment: (NOTE) Calculated using the CKD-EPI Creatinine Equation (2021)    Anion gap 9 5 - 15    Comment: Performed at [facility] Lab, [address], [place], [place] [address]  Lipase, blood     Status: None   Collection Time: 12/28/23  6:00 AM  Result Value Ref Range   Lipase 18 11 - 51 U/L    Comment: Performed at [facility], [address] [place], [place]  [address]  Troponin I (High Sensitivity)     Status: Abnormal   Collection Time: 12/28/23  6:00 AM  Result Value Ref Range   Troponin I (High Sensitivity) 300 (HH) <18 ng/L    Comment: 1st ATTEMPT 12/28/23 0650  MAULES CRITICAL RESULT CALLED TO, READ BACK BY AND VERIFIED WITH RN, MELISSA T 12/28/23 0700 MAULES (NOTE) Elevated high sensitivity troponin I (hsTnI) values and significant  changes across serial measurements may suggest ACS but many other  chronic and acute conditions are known to elevate hsTnI results.  Refer to the Links section for chest pain algorithms and additional  guidance. Performed at [facility], [address], [place], [place] [address]   Hemoglobin A1c     Status: Abnormal   Collection Time: 12/28/23  6:00 AM  Result Value Ref Range   Hgb A1c MFr Bld 3.6 (L) 4.8 - 5.6 %    Comment: (NOTE) Diagnosis of Diabetes The following HbA1c ranges recommended by the American Diabetes Association (ADA) may be used as an aid in the diagnosis of diabetes mellitus.  Hemoglobin             Suggested A1C NGSP%              Diagnosis  <5.7                   Non Diabetic  5.7-6.4                Pre-Diabetic  >6.4                   Diabetic  <7.0                   Glycemic control for                       adults with diabetes.     Mean Plasma Glucose 56.62 mg/dL    Comment: Performed at [facility], [address] [place], [place] [address]  Resp panel by RT-PCR (RSV, Flu A&B, Covid) Anterior Nasal Swab     Status: None   Collection Time: 12/28/23  6:14 AM   Specimen: Anterior Nasal Swab  Result Value Ref Range   SARS Coronavirus 2 by RT PCR NEGATIVE NEGATIVE   Influenza A by PCR NEGATIVE NEGATIVE   Influenza B by PCR NEGATIVE NEGATIVE    Comment: (NOTE) The Xpert Xpress SARS-CoV-2/FLU/RSV plus assay is intended as an aid in the diagnosis of influenza from Nasopharyngeal swab specimens and should not be used as a sole basis for treatment. Nasal washings and aspirates are unacceptable for Xpert Xpress SARS-CoV-2/FLU/RSV testing.  Fact Sheet for Patients: BloggerCourse.com  Fact Sheet for Healthcare  Providers: SeriousBroker.it  This test is not yet approved or cleared by the [place]  FDA and has been authorized for detection and/or diagnosis of SARS-CoV-2 by FDA under an Emergency Use Authorization (EUA). This EUA will remain in effect (meaning this test can be used) for the duration of the COVID-19 declaration under Section 564(b)(1) of the Act, 21 U.S.C. section 360bbb-3(b)(1), unless the authorization is terminated or revoked.     Resp Syncytial Virus by PCR NEGATIVE NEGATIVE    Comment: (NOTE) Fact Sheet for Patients: BloggerCourse.com  Fact Sheet for Healthcare Providers: SeriousBroker.it  This test is not yet approved or cleared by the [place]  FDA and has been authorized for detection and/or diagnosis of SARS-CoV-2 by FDA under an  Emergency Use Authorization (EUA). This EUA will remain in effect (meaning this test can be used) for the duration of the COVID-19 declaration under Section 564(b)(1) of the Act, 21 U.S.C. section 360bbb-3(b)(1), unless the authorization is terminated or revoked.  Performed at [facility], [address] [place], [place] [address]   Prepare RBC (crossmatch)     Status: None   Collection Time: 12/28/23  7:03 AM  Result Value Ref Range   Order Confirmation      ORDER PROCESSED BY BLOOD BANK Performed at [facility], [address], [place], [place] [address]   Type and screen [facility]     Status: None (Preliminary result)   Collection Time: 12/28/23  7:15 AM  Result Value Ref Range   ABO/RH(D) O POS    Antibody Screen NEG    Sample Expiration 12/31/2023,2359    Unit Number [id number]    Blood Component Type RBC LR PHER1    Unit division 00    Status of Unit ISSUED    Transfusion Status OK TO TRANSFUSE    Crossmatch Result      Compatible Performed at [facility], [address], [place], [place]  [address]    Unit Number [id number]    Blood Component Type RED CELLS,LR    Unit division 00    Status of Unit ISSUED    Transfusion Status OK TO TRANSFUSE    Crossmatch Result Compatible   POC occult blood, ED Provider will collect     Status: Abnormal   Collection Time: 12/28/23  8:17 AM  Result Value Ref Range   Fecal Occult Bld POSITIVE (A) NEGATIVE  Troponin I (High Sensitivity)     Status: Abnormal   Collection Time: 12/28/23  8:48 AM  Result Value Ref Range   Troponin I (High Sensitivity) 297 (HH) <18 ng/L    Comment: CRITICAL VALUE NOTED. VALUE IS CONSISTENT WITH PREVIOUSLY REPORTED/CALLED VALUE (NOTE) Elevated high sensitivity troponin I (hsTnI) values and significant  changes across serial measurements may suggest ACS but many other  chronic and acute conditions are known to elevate hsTnI results.  Refer to the Links section for chest pain algorithms and additional  guidance. Performed at [facility], [address] [place], [place] [address]   CBG monitoring, ED     Status: Abnormal   Collection Time: 12/28/23  1:23 PM  Result Value Ref Range   Glucose-Capillary 197 (H) 70 - 99 mg/dL    Comment: Glucose reference range applies only to samples taken after fasting for at least 8 hours.   DG Chest Portable 1 View Result Date: 12/28/2023 EXAM: 1 VIEW(S) XRAY OF THE CHEST 12/28/2023 06:16:50 AM COMPARISON: None available. CLINICAL HISTORY: chest pain. Chest discomfort,nausea,dizziness,hx chf,mi; rover FINDINGS: LUNGS AND PLEURA: No focal pulmonary opacity. No pulmonary edema. No pleural effusion. No pneumothorax. HEART AND MEDIASTINUM: Cardiomegaly. Atherosclerotic calcifications of the aortic arch. BONES AND SOFT TISSUES: No acute osseous abnormality. IMPRESSION: 1. No acute cardiopulmonary process identified. 2. Cardiomegaly. 3. Atherosclerotic calcifications of the aortic arch. Electronically signed by: Waddell Calk MD 12/28/2023 06:23 AM EDT RP Workstation:  [id number]   Review of Systems  Constitutional:  Positive for activity change, appetite change and fatigue. Negative for chills, diaphoresis, fever and unexpected weight change.  HENT: Negative.    Eyes: Negative.   Respiratory:  Positive for chest tightness. Negative for apnea, cough, choking, shortness of breath, wheezing and stridor.   Cardiovascular:  Positive for chest pain and palpitations. Negative for leg swelling.  Gastrointestinal:  Positive for constipation, nausea and vomiting. Negative for abdominal distention, abdominal pain, anal bleeding, blood in stool, diarrhea and rectal pain.  Genitourinary:  Negative for decreased urine volume, difficulty urinating, dyspareunia, dysuria, enuresis, flank pain, frequency, genital sores, hematuria, menstrual problem, pelvic pain, urgency, vaginal bleeding, vaginal discharge and vaginal pain.  Musculoskeletal:  Positive for arthralgias.  Skin: Negative.   Neurological:  Positive for weakness, light-headedness and headaches. Negative for dizziness, tremors, seizures, syncope, facial asymmetry, speech difficulty and numbness.  Hematological: Negative.   Psychiatric/Behavioral: Negative.     Blood pressure (!) 107/52, pulse 82, temperature 98 F (36.7 C), temperature source Oral, resp. rate 18, height 5' 7 (1.702 m), weight 99.3 kg, SpO2 100%. Physical Exam Constitutional:      General: She is not in acute distress.    Appearance: She is not ill-appearing or toxic-appearing.  HENT:     Head: Normocephalic and atraumatic.     Mouth/Throat:     Mouth: Mucous membranes are dry.  Eyes:     Extraocular Movements: Extraocular movements intact.     Pupils: Pupils are equal, round, and reactive to light.  Cardiovascular:     Rate and Rhythm: Normal rate and regular rhythm.     Pulses: Normal pulses.     Heart sounds: Normal heart sounds.  Pulmonary:     Effort: Pulmonary effort is normal.     Breath sounds: Normal breath sounds.  Abdominal:      General: There is no distension.     Palpations: There is no mass.     Tenderness: There is no abdominal tenderness. There is no guarding or rebound.     Hernia: A hernia is present.     Comments: obese with a small reducible umbilical hernia  Musculoskeletal:     Cervical back: Normal range of motion and neck supple.     Comments: Patient is had a below-knee amputation on the left side  Skin:    General: Skin is warm and dry.  Neurological:     General: No focal deficit present.     Mental Status: She is alert and oriented to person, place, and time.  Psychiatric:        Mood and Affect: Mood normal.        Behavior: Behavior normal.        Thought Content: Thought content normal.        Judgment: Judgment normal.   Assessment/Plan: 1) Symptomatic anemia secondary to melenic stools patient has received 2 units of packed red blood cells plans are to proceed with a EGD tomorrow Eliquis  is on hold. Agree with PPIs for now. 2) Chronic constipation. 3) Severe nausea vomiting/GERD/hiatal hernia. 4) AODM with long-term use of insulin  5) Paroxysmal atrial fibrillation on chronic anticoagulation 6) Systolic congestive heart failure 7) Peripheral artery disease status post amputation of left lower limb BKA. 8) HTN/Hyperlipidemia. 9) Hypothyroidism. 10) Obesity. 11) Umbilical hernia Tammy Good 12/28/2023, 5:51 PM

## 2023-12-28 NOTE — ED Notes (Signed)
 Pt blood consent in chart, agrees to risks and benefits as explained by provider. See flowsheet for pre transfusion vitals. Dual chamber Y set filter used.

## 2023-12-28 NOTE — ED Triage Notes (Addendum)
 Pt in from home via [facility] with reported n/v and emesis. Pt states she experienced dizziness nearly all day yesterday, began to feel nauseous early evening. Pt states she has been unable to sleep all night, reports 10+ episodes of vomiting. Pt states last night she did experience a brief period of L sided cp, none presently. Pt adds she was constipated up until yesterday, has been drinking prune juice and laxatives frequently.  VS/trx w/EMS: CBG 160 182/97 82HR 98%RA 4mg  Zofran  - 20G LAC

## 2023-12-29 ENCOUNTER — Observation Stay (HOSPITAL_COMMUNITY)

## 2023-12-29 ENCOUNTER — Encounter (HOSPITAL_COMMUNITY): Payer: Self-pay | Admitting: Internal Medicine

## 2023-12-29 ENCOUNTER — Encounter (HOSPITAL_COMMUNITY)
Admission: EM | Disposition: A | Discharge status: Home / Self Care | Payer: Self-pay | Source: Home / Self Care | Attending: Internal Medicine

## 2023-12-29 DIAGNOSIS — I2510 Atherosclerotic heart disease of native coronary artery without angina pectoris: Secondary | ICD-10-CM | POA: Diagnosis present

## 2023-12-29 DIAGNOSIS — I2489 Other forms of acute ischemic heart disease: Secondary | ICD-10-CM | POA: Diagnosis present

## 2023-12-29 DIAGNOSIS — E039 Hypothyroidism, unspecified: Secondary | ICD-10-CM | POA: Diagnosis present

## 2023-12-29 DIAGNOSIS — I5042 Chronic combined systolic (congestive) and diastolic (congestive) heart failure: Secondary | ICD-10-CM | POA: Diagnosis present

## 2023-12-29 DIAGNOSIS — D509 Iron deficiency anemia, unspecified: Secondary | ICD-10-CM | POA: Diagnosis not present

## 2023-12-29 DIAGNOSIS — Z23 Encounter for immunization: Secondary | ICD-10-CM | POA: Diagnosis present

## 2023-12-29 DIAGNOSIS — K31811 Angiodysplasia of stomach and duodenum with bleeding: Secondary | ICD-10-CM | POA: Diagnosis present

## 2023-12-29 DIAGNOSIS — I480 Paroxysmal atrial fibrillation: Secondary | ICD-10-CM | POA: Diagnosis present

## 2023-12-29 DIAGNOSIS — Z79890 Hormone replacement therapy (postmenopausal): Secondary | ICD-10-CM | POA: Diagnosis not present

## 2023-12-29 DIAGNOSIS — E1159 Type 2 diabetes mellitus with other circulatory complications: Secondary | ICD-10-CM | POA: Diagnosis not present

## 2023-12-29 DIAGNOSIS — E230 Hypopituitarism: Secondary | ICD-10-CM | POA: Diagnosis present

## 2023-12-29 DIAGNOSIS — D649 Anemia, unspecified: Secondary | ICD-10-CM | POA: Diagnosis not present

## 2023-12-29 DIAGNOSIS — Z794 Long term (current) use of insulin: Secondary | ICD-10-CM | POA: Diagnosis not present

## 2023-12-29 DIAGNOSIS — Z89512 Acquired absence of left leg below knee: Secondary | ICD-10-CM | POA: Diagnosis not present

## 2023-12-29 DIAGNOSIS — K219 Gastro-esophageal reflux disease without esophagitis: Secondary | ICD-10-CM | POA: Diagnosis present

## 2023-12-29 DIAGNOSIS — I10 Essential (primary) hypertension: Secondary | ICD-10-CM

## 2023-12-29 DIAGNOSIS — Z8249 Family history of ischemic heart disease and other diseases of the circulatory system: Secondary | ICD-10-CM | POA: Diagnosis not present

## 2023-12-29 DIAGNOSIS — R531 Weakness: Secondary | ICD-10-CM | POA: Diagnosis present

## 2023-12-29 DIAGNOSIS — I110 Hypertensive heart disease with heart failure: Secondary | ICD-10-CM | POA: Diagnosis present

## 2023-12-29 DIAGNOSIS — D62 Acute posthemorrhagic anemia: Secondary | ICD-10-CM | POA: Diagnosis present

## 2023-12-29 DIAGNOSIS — Z1152 Encounter for screening for COVID-19: Secondary | ICD-10-CM | POA: Diagnosis not present

## 2023-12-29 DIAGNOSIS — K31819 Angiodysplasia of stomach and duodenum without bleeding: Secondary | ICD-10-CM

## 2023-12-29 DIAGNOSIS — Z955 Presence of coronary angioplasty implant and graft: Secondary | ICD-10-CM | POA: Diagnosis not present

## 2023-12-29 DIAGNOSIS — Z7901 Long term (current) use of anticoagulants: Secondary | ICD-10-CM | POA: Diagnosis not present

## 2023-12-29 DIAGNOSIS — E1151 Type 2 diabetes mellitus with diabetic peripheral angiopathy without gangrene: Secondary | ICD-10-CM | POA: Diagnosis present

## 2023-12-29 DIAGNOSIS — E66811 Obesity, class 1: Secondary | ICD-10-CM | POA: Diagnosis present

## 2023-12-29 DIAGNOSIS — R7989 Other specified abnormal findings of blood chemistry: Secondary | ICD-10-CM | POA: Diagnosis not present

## 2023-12-29 DIAGNOSIS — K922 Gastrointestinal hemorrhage, unspecified: Secondary | ICD-10-CM | POA: Diagnosis not present

## 2023-12-29 DIAGNOSIS — Z87891 Personal history of nicotine dependence: Secondary | ICD-10-CM

## 2023-12-29 DIAGNOSIS — K5909 Other constipation: Secondary | ICD-10-CM | POA: Diagnosis present

## 2023-12-29 DIAGNOSIS — E1169 Type 2 diabetes mellitus with other specified complication: Secondary | ICD-10-CM | POA: Diagnosis present

## 2023-12-29 HISTORY — PX: HOT HEMOSTASIS: SHX5433

## 2023-12-29 HISTORY — PX: ESOPHAGOGASTRODUODENOSCOPY: SHX5428

## 2023-12-29 LAB — CBC
HCT: 28.1 % — ABNORMAL LOW (ref 36.0–46.0)
Hemoglobin: 8.5 g/dL — ABNORMAL LOW (ref 12.0–15.0)
MCH: 28.2 pg (ref 26.0–34.0)
MCHC: 30.2 g/dL (ref 30.0–36.0)
MCV: 93.4 fL (ref 80.0–100.0)
Platelets: 470 K/uL — ABNORMAL HIGH (ref 150–400)
RBC: 3.01 MIL/uL — ABNORMAL LOW (ref 3.87–5.11)
RDW: 16.7 % — ABNORMAL HIGH (ref 11.5–15.5)
WBC: 8.7 K/uL (ref 4.0–10.5)
nRBC: 0 % (ref 0.0–0.2)

## 2023-12-29 LAB — GLUCOSE, CAPILLARY
Glucose-Capillary: 90 mg/dL (ref 70–99)
Glucose-Capillary: 83 mg/dL (ref 70–99)
Glucose-Capillary: 138 mg/dL — ABNORMAL HIGH (ref 70–99)
Glucose-Capillary: 87 mg/dL (ref 70–99)
Glucose-Capillary: 245 mg/dL — ABNORMAL HIGH (ref 70–99)

## 2023-12-29 LAB — BASIC METABOLIC PANEL WITH GFR
Anion gap: 6 (ref 5–15)
BUN: 10 mg/dL (ref 8–23)
CO2: 28 mmol/L (ref 22–32)
Calcium: 8.4 mg/dL — ABNORMAL LOW (ref 8.9–10.3)
Chloride: 105 mmol/L (ref 98–111)
Creatinine, Ser: 0.78 mg/dL (ref 0.44–1.00)
GFR, Estimated: >60 mL/min (ref >60)
Glucose, Bld: 110 mg/dL — ABNORMAL HIGH (ref 70–99)
Potassium: 4.1 mmol/L (ref 3.5–5.1)
Sodium: 139 mmol/L (ref 135–145)

## 2023-12-29 LAB — URINALYSIS, ROUTINE W REFLEX MICROSCOPIC
Bilirubin Urine: NEGATIVE
Glucose, UA: >=500 mg/dL — AB
Ketones, ur: NEGATIVE mg/dL
Nitrite: NEGATIVE
Protein, ur: NEGATIVE mg/dL
Specific Gravity, Urine: 1.011 (ref 1.005–1.030)
pH: 6 (ref 5.0–8.0)

## 2023-12-29 LAB — BPAM RBC
Blood Product Expiration Date: 202510302359
Blood Product Expiration Date: 202510302359
ISSUE DATE / TIME: 202510060834
ISSUE DATE / TIME: 202510061026
Unit Type and Rh: 5100
Unit Type and Rh: 5100

## 2023-12-29 LAB — TYPE AND SCREEN
ABO/RH(D): O POS
Antibody Screen: NEGATIVE
Unit division: 0
Unit division: 0

## 2023-12-29 SURGERY — EGD (ESOPHAGOGASTRODUODENOSCOPY)
Anesthesia: Monitor Anesthesia Care

## 2023-12-29 MED ORDER — SODIUM CHLORIDE 0.9 % IV SOLN: INTRAVENOUS | Status: DC | PRN

## 2023-12-29 MED ORDER — PROCHLORPERAZINE EDISYLATE 10 MG/2ML IJ SOLN
5.0000 mg | Freq: Once | INTRAMUSCULAR | Status: AC
Start: 2023-12-30 — End: 2023-12-29
  Administered 2023-12-29: 5 mg via INTRAVENOUS
  Filled 2023-12-29: qty 2

## 2023-12-29 MED ORDER — PROPOFOL 10 MG/ML IV BOLUS
INTRAVENOUS | Status: DC | PRN
  Administered 2023-12-29 (×2): 40 mg via INTRAVENOUS
  Administered 2023-12-29: 150 ug/kg/min via INTRAVENOUS

## 2023-12-29 MED ORDER — GLUCAGON HCL RDNA (DIAGNOSTIC) 1 MG IJ SOLR
INTRAMUSCULAR | Status: DC | PRN
Start: 2023-12-29 — End: 2023-12-29
  Administered 2023-12-29: .5 mg via INTRAVENOUS

## 2023-12-29 MED ORDER — PHENYLEPHRINE 80 MCG/ML (10ML) SYRINGE FOR IV PUSH (FOR BLOOD PRESSURE SUPPORT)
PREFILLED_SYRINGE | INTRAVENOUS | Status: DC | PRN
Start: 2023-12-29 — End: 2023-12-29
  Administered 2023-12-29: 160 ug via INTRAVENOUS

## 2023-12-29 MED ORDER — LIDOCAINE 2% (20 MG/ML) 5 ML SYRINGE
INTRAMUSCULAR | Status: DC | PRN
  Administered 2023-12-29: 80 mg via INTRAVENOUS

## 2023-12-29 NOTE — Op Note (Signed)
 [facility] Patient Name: Tammy Good Procedure Date : 12/29/2023 MRN: [id number] Attending MD: Belvie Just , MD, [id number] Date of Birth: 1950-03-26 CSN: [id number] Age: 73 Admit Type: Inpatient Procedure:                Upper GI endoscopy Indications:              Melena Providers:                Belvie Just, MD, Ozell Pouch, Corene Southgate,                            Technician Referring MD:              Medicines:                Propofol  per Anesthesia Complications:            No immediate complications. Estimated Blood Loss:     Estimated blood loss: none. Procedure:                Pre-Anesthesia Assessment:                           - Prior to the procedure, a History and Physical                            was performed, and patient medications and                            allergies were reviewed. The patient's tolerance of                            previous anesthesia was also reviewed. The risks                            and benefits of the procedure and the sedation                            options and risks were discussed with the patient.                            All questions were answered, and informed consent                            was obtained. Prior Anticoagulants: The patient has                            taken no anticoagulant or antiplatelet agents. ASA                            Grade Assessment: III - A patient with severe                            systemic disease. After reviewing the risks and                            benefits,  the patient was deemed in satisfactory                            condition to undergo the procedure.                           - Sedation was administered by an anesthesia                            professional. Deep sedation was attained.                           After obtaining informed consent, the endoscope was                            passed under direct vision. Throughout the                             procedure, the patient's blood pressure, pulse, and                            oxygen saturations were monitored continuously. The                            [id number]-[id number] ([id number]) Olympus endoscope was introduced                            through the mouth, and advanced to the second part                            of duodenum. The upper GI endoscopy was                            accomplished without difficulty. The patient                            tolerated the procedure well. Scope In: Scope Out: Findings:      The esophagus was normal.      The stomach was normal.      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 Coagulation for tissue destruction using       monopolar probe was successful.      Several small punctate AVMs were noted in the second portion of the       duodenum. Application of the APC did induce bleeding with one of the       vessels. This suggests that these were the source of her melena.       Multiple applications were applied to the visualized lesions and all       lesions were ablated. Impression:               - Normal esophagus.                           - Normal stomach.                           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 Treated with a monopolar probe.                           - No specimens collected. Recommendation:           - Return patient to hospital ward for ongoing care.                           - Resume regular diet.                           - Continue present medications.                           - Restart anticoagulation tomorrow.                           - Follow HGB and transfuse if necessary. Procedure Code(s):        --- Professional ---                           [phone], Esophagogastroduodenoscopy, flexible,                            transoral; with ablation of tumor(s), polyp(s), or                             other lesion(s) (includes pre- and post-dilation                            and guide wire passage, when performed) Diagnosis Code(s):        --- Professional ---                           K31.819, Angiodysplasia of stomach and duodenum                            without bleeding                           K92.1, Melena (includes Hematochezia) CPT copyright 2022 American Medical Association. All rights reserved. The codes documented in this report are preliminary and upon coder review may  be revised to meet current compliance requirements. Belvie Just, MD Belvie Just, MD 12/29/2023 2:37:13 PM This report has been signed electronically. Number of Addenda: 0

## 2023-12-29 NOTE — Progress Notes (Signed)
 PROGRESS NOTE    Tammy Good  FMW:[id number] DOB: 17-Jan-1951 DOA: 12/28/2023 PCP: Nichole Senior, MD   Chief Complaint  Patient presents with   Emesis   Dizziness    Brief Narrative:   Tammy Good is a 73 y.o. 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  - Her workup significant for anemia, hemoglobin 5.6, Hemoccult positive stool, admitted for further workup.     Assessment & Plan:   Principal Problem:   Symptomatic anemia Active Problems:   GI bleed   CAD S/P percutaneous coronary angioplasty   Elevated troponin   Diabetes mellitus, type 2 (HCC)   Prolonged QT interval   Renal insufficiency   Paroxysmal atrial fibrillation (HCC)   Chronic anticoagulation   Chronic systolic CHF (congestive heart failure) (HCC)   PAD (peripheral artery disease)   Hyperlipidemia associated with type 2 diabetes mellitus (HCC)   Panhypopituitarism   Obesity (BMI 30-39.9)   Symptomatic anemia secondary to GI bleed - Symptomatic anemia, Hemoccult positive stools, on Eliquis , denies any NSAIDs use . - Hold Eliquis  and Plavix  . - Transfused unit PRBC, hemoglobin improved this morning to 8.5, continue to monitor and transfuse as needed . - Continue with IV Protonix  . - Plan for EGD today per GI    Elevated troponin CAD -Significantly elevated at 300-> 297.  - She denies any chest pain. - History of CAD -Most likely demand ischemia due to symptomatic anemia. -Resume Plavix  when stable   Diabetes mellitus type 2, with long-term use of insulin  -Hold Farxiga  for now  - A1c . - Continue with insulin  sliding scale    Prolonged QT interval QTc noted to be prolonged at 507. - Correct electrolyte abnormalities - Avoid QT prolonging medications   Renal insufficiency Creatinine  noted to be 1.04 BUN 14.  Baseline creatinine previous noted to be around 0.8 - Continue to monitor   Paroxysmal atrial fibrillation on chronic anticoagulation Patient appears to be in a sinus rhythm at this time.  Last dose of Eliquis  yesterday evening. - Hold Eliquis    Chronic systolic congestive heart failure Patient currently appears to be euvolemic.  Last EF noted to be 40-45% with indeterminate diastolic parameters. - Strict I&Os and daily weights   Peripheral artery disease Status post left BKA Patient is followed by vascular surgery with last intervention - Held Plavix  in acute setting.  Resume once stable   Hyperlipidemia - Continue atorvastatin    Panhypopituitarism  - Continue levothyroxine  and Decadron    Obesity BMI 34.3 kg/m   DVT prophylaxis: (SCDs Code Status: (Full) Family Communication: (None at bedside) Disposition:      Consultants:  Gastroenterology Subjective:  No nausea, no vomiting, reports she has been n.p.o. since midnight  Objective: Vitals:   12/29/23 0000 12/29/23 0442 12/29/23 0449 12/29/23 0744  BP: (!) 109/44 108/60  130/66  Pulse: 70 67  70  Resp: 18 18    Temp: 98 F (36.7 C) 98.3 F (36.8 C)  98 F (36.7 C)  TempSrc: Oral Oral  Oral  SpO2: 98% 100%  98%  Weight:   90.2 kg   Height:        Intake/Output Summary (Last 24 hours) at 12/29/2023 1000 Last data filed at 12/29/2023 0625 Gross per 24 hour  Intake 342.67 ml  Output 300 ml  Net 42.67 ml  Filed Weights   12/28/23 2216 12/28/23 2225 12/29/23 0449  Weight: 90.1 kg 90.1 kg 90.2 kg    Examination:  Awake Alert, Oriented X 3, No new F.N deficits, Normal affect Symmetrical Chest wall movement, Good air movement bilaterally, CTAB RRR,No Gallops,Rubs or new Murmurs, No Parasternal Heave +ve B.Sounds, Abd Soft, No tenderness, No rebound - guarding or rigidity. No Cyanosis, Clubbing or edema, No new Rash or bruise, left BKA    Data Reviewed: I have personally  reviewed following labs and imaging studies  CBC: Recent Labs  Lab 12/28/23 0600 12/28/23 1757 12/28/23 2259 12/29/23 0430  WBC 9.6  --   --  8.7  NEUTROABS 8.0*  --   --   --   HGB 5.6* 8.1* 8.8* 8.5*  HCT 21.1* 27.3* 29.0* 28.1*  MCV 99.5  --   --  93.4  PLT 577*  --   --  470*    Basic Metabolic Panel: Recent Labs  Lab 12/28/23 0600 12/29/23 0430  NA 136 139  K 3.7 4.1  CL 104 105  CO2 23 28  GLUCOSE 143* 110*  BUN 14 10  CREATININE 1.04* 0.78  CALCIUM  8.3* 8.4*    GFR: Estimated Creatinine Clearance: 74.3 mL/min (by C-G formula based on SCr of 0.78 mg/dL).  Liver Function Tests: Recent Labs  Lab 12/28/23 0600  AST 16  ALT 13  ALKPHOS 78  BILITOT 1.1  PROT 5.2*  ALBUMIN  2.7*    CBG: Recent Labs  Lab 12/28/23 1323 12/28/23 2132 12/28/23 2231 12/29/23 0743  GLUCAP 197* 168* 129* 90     Recent Results (from the past 240 hours)  Resp panel by RT-PCR (RSV, Flu A&B, Covid) Anterior Nasal Swab     Status: None   Collection Time: 12/28/23  6:14 AM   Specimen: Anterior Nasal Swab  Result Value Ref Range Status   SARS Coronavirus 2 by RT PCR NEGATIVE NEGATIVE Final   Influenza A by PCR NEGATIVE NEGATIVE Final   Influenza B by PCR NEGATIVE NEGATIVE Final    Comment: (NOTE) The Xpert Xpress SARS-CoV-2/FLU/RSV plus assay is intended as an aid in the diagnosis of influenza from Nasopharyngeal swab specimens and should not be used as a sole basis for treatment. Nasal washings and aspirates are unacceptable for Xpert Xpress SARS-CoV-2/FLU/RSV testing.  Fact Sheet for Patients: BloggerCourse.com  Fact Sheet for Healthcare Providers: SeriousBroker.it  This test is not yet approved or cleared by the [place]  FDA and has been authorized for detection and/or diagnosis of SARS-CoV-2 by FDA under an Emergency Use Authorization (EUA). This EUA will remain in effect (meaning this test can be used) for  the duration of the COVID-19 declaration under Section 564(b)(1) of the Act, 21 U.S.C. section 360bbb-3(b)(1), unless the authorization is terminated or revoked.     Resp Syncytial Virus by PCR NEGATIVE NEGATIVE Final    Comment: (NOTE) Fact Sheet for Patients: BloggerCourse.com  Fact Sheet for Healthcare Providers: SeriousBroker.it  This test is not yet approved or cleared by the [place]  FDA and has been authorized for detection and/or diagnosis of SARS-CoV-2 by FDA under an Emergency Use Authorization (EUA). This EUA will remain in effect (meaning this test can be used) for the duration of the COVID-19 declaration under Section 564(b)(1) of the Act, 21 U.S.C. section 360bbb-3(b)(1), unless the authorization is terminated or revoked.  Performed at [facility], [address], [place], [place] [address]          Radiology  Studies: DG Chest Portable 1 View Result Date: 12/28/2023 EXAM: 1 VIEW(S) XRAY OF THE CHEST 12/28/2023 06:16:50 AM COMPARISON: None available. CLINICAL HISTORY: chest pain. Chest discomfort,nausea,dizziness,hx chf,mi; rover FINDINGS: LUNGS AND PLEURA: No focal pulmonary opacity. No pulmonary edema. No pleural effusion. No pneumothorax. HEART AND MEDIASTINUM: Cardiomegaly. Atherosclerotic calcifications of the aortic arch. BONES AND SOFT TISSUES: No acute osseous abnormality. IMPRESSION: 1. No acute cardiopulmonary process identified. 2. Cardiomegaly. 3. Atherosclerotic calcifications of the aortic arch. Electronically signed by: Waddell Calk MD 12/28/2023 06:23 AM EDT RP Workstation: [id number]        Scheduled Meds:  atorvastatin   40 mg Oral q1800   dexamethasone   0.5 mg Oral Daily   ferrous sulfate   325 mg Oral QHS   Influenza vac split trivalent PF  0.5 mL Intramuscular Tomorrow-1000   insulin  aspart  0-9 Units Subcutaneous TID WC   levothyroxine   125 mcg Oral QAC breakfast   midodrine    5 mg Oral BID AC   pantoprazole  (PROTONIX ) IV  40 mg Intravenous Q12H   sodium chloride  flush  3 mL Intravenous Q12H   Continuous Infusions:   LOS: 0 days      Brayton Lye, MD [facility]   To contact the attending provider between 7A-7P or the covering provider during after hours 7P-7A, please log into the web site [email or web address] and access using universal [place] password for that web site. If you do not have the password, please call the hospital operator.  12/29/2023, 10:00 AM

## 2023-12-29 NOTE — Anesthesia Preprocedure Evaluation (Addendum)
 Anesthesia Evaluation  Patient identified by MRN, date of birth, ID band Patient awake    Reviewed: Allergy & Precautions, NPO status , Patient's Chart, lab work & pertinent test results  History of Anesthesia Complications Negative for: history of anesthetic complications  Airway Mallampati: II      Comment: Previous grade I view with Miller 2, easy mask Dental  (+) Teeth Intact, Dental Advisory Given   Pulmonary neg COPD, neg recent URI, former smoker   breath sounds clear to auscultation       Cardiovascular hypertension, + CAD, + Past MI (2015), + Cardiac Stents (2015), + Peripheral Vascular Disease (s/p left BKA) and +CHF (EF 40-45%)  + dysrhythmias (prolonged QT) Atrial Fibrillation  Rhythm:Regular Rate:Normal  Elevated troponin thought to be due to demand ischemia from bleeding, HLD  TEE 04/26/2021: IMPRESSIONS    1. Left ventricular ejection fraction, by estimation, is 40 to 45%. The  left ventricle has mildly decreased function. The left ventricle  demonstrates global hypokinesis.   2. Right ventricular systolic function is normal. The right ventricular  size is normal.   3. Left atrial size was mildly dilated. No left atrial/left atrial  appendage thrombus was detected.   4. The mitral valve is normal in structure. Trivial mitral valve  regurgitation. No evidence of mitral stenosis.   5. The aortic valve is tricuspid. Aortic valve regurgitation is not  visualized. No aortic stenosis is present.   6. There is mild (Grade II) plaque involving the descending aorta.   [facility]HC 11/19/2020: Conclusions: 1. Severe two-vessel coronary artery disease including 95% proximal LCx stenosis and multifocal D1 disease of up to 90%. 2. Mild to moderate LAD and RCA disease. 3. Patent RCA stent. 4. Mildly elevated left ventricular filling pressure (LVEDP ~20 mmHg). 5. Challenging but successful PCI to proximal LCx using Onyx Frontier 2.5 x  12 mm DES with 0% residual stenosis and TIMI-3 flow.     Neuro/Psych neg Seizures Vertigo     GI/Hepatic PUD,GERD  Medicated,,Fatty liver   Endo/Other  diabetes, Type 2, Insulin  DependentHypothyroidism  Panhypopituitarism   Renal/GU Renal disease     Musculoskeletal   Abdominal   Peds  Hematology  (+) Blood dyscrasia (thrombocytosis), anemia Lab Results      Component                Value               Date                      WBC                      8.7                 12/29/2023                HGB                      8.5 (L)             12/29/2023                HCT                      28.1 (L)            12/29/2023                MCV  93.4                12/29/2023                PLT                      470 (H)             12/29/2023              Anesthesia Other Findings Last Eliquis :  Last Plavix :  Last Farxiga :  Reproductive/Obstetrics                              Anesthesia Physical Anesthesia Plan  ASA: 3  Anesthesia Plan: MAC   Post-op Pain Management: Minimal or no pain anticipated   Induction: Intravenous  PONV Risk Score and Plan: 2 and Propofol  infusion, TIVA and Treatment may vary due to age or medical condition  Airway Management Planned: Natural Airway and Nasal Cannula  Additional Equipment:   Intra-op Plan:   Post-operative Plan:   Informed Consent:   Plan Discussed with:   Anesthesia Plan Comments:          Anesthesia Quick Evaluation

## 2023-12-29 NOTE — Anesthesia Postprocedure Evaluation (Signed)
 Anesthesia Post Note  Patient: Tammy Good  Procedure(s) Performed: EGD (ESOPHAGOGASTRODUODENOSCOPY) EGD, WITH ARGON PLASMA COAGULATION     Patient location during evaluation: Endoscopy Anesthesia Type: MAC Level of consciousness: awake Pain management: pain level controlled Vital Signs Assessment: post-procedure vital signs reviewed and stable Respiratory status: spontaneous breathing Cardiovascular status: blood pressure returned to baseline Postop Assessment: no apparent nausea or vomiting Anesthetic complications: no   No notable events documented.  Last Vitals:  Vitals:   12/29/23 1440 12/29/23 1450  BP: (!) 95/50 118/63  Pulse: 73 71  Resp: (!) 22 18  Temp:    SpO2: 98% 96%    Last Pain:  Vitals:   12/29/23 1450  TempSrc:   PainSc: 0-No pain                 Lauraine KATHEE [place]

## 2023-12-29 NOTE — Transfer of Care (Signed)
 Immediate Anesthesia Transfer of Care Note  Patient: Tammy Good Row  Procedure(s) Performed: EGD (ESOPHAGOGASTRODUODENOSCOPY) EGD, WITH ARGON PLASMA COAGULATION  Patient Location: PACU and Endoscopy Unit  Anesthesia Type:MAC  Level of Consciousness: awake  Airway & Oxygen Therapy: Patient Spontanous Breathing and Patient connected to face mask oxygen  Post-op Assessment: Report given to RN and Post -op Vital signs reviewed and stable  Post vital signs: Reviewed and stable  Last Vitals:  Vitals Value Taken Time  BP 95/50 12/29/23 14:40  Temp 36.6 C 12/29/23 14:34  Pulse 74 12/29/23 14:41  Resp 20 12/29/23 14:41  SpO2 96 % 12/29/23 14:41  Vitals shown include unfiled device data.  Last Pain:  Vitals:   12/29/23 1440  TempSrc:   PainSc: 0-No pain         Complications: No notable events documented.

## 2023-12-29 NOTE — Care Management Obs Status (Signed)
 MEDICARE OBSERVATION STATUS NOTIFICATION   Patient Details  Name: Tammy Good MRN: [id number] Date of Birth: Jan 25, 1951   Medicare Observation Status Notification Given:  Yes  Verbally reviewed observation notice with Soundra Roux telephonically at [phone]. Will mail a copy to the patients home address.  Saxon Crosby 12/29/2023, 9:30 AM

## 2023-12-29 NOTE — Plan of Care (Signed)
   Problem: Education: Goal: Ability to describe self-care measures that may prevent or decrease complications (Diabetes Survival Skills Education) will improve Outcome: Progressing Goal: Individualized Educational Video(s) Outcome: Progressing

## 2023-12-30 ENCOUNTER — Other Ambulatory Visit (HOSPITAL_COMMUNITY): Payer: Self-pay

## 2023-12-30 DIAGNOSIS — D649 Anemia, unspecified: Secondary | ICD-10-CM | POA: Diagnosis not present

## 2023-12-30 DIAGNOSIS — E1159 Type 2 diabetes mellitus with other circulatory complications: Secondary | ICD-10-CM | POA: Diagnosis not present

## 2023-12-30 DIAGNOSIS — R7989 Other specified abnormal findings of blood chemistry: Secondary | ICD-10-CM | POA: Diagnosis not present

## 2023-12-30 DIAGNOSIS — K922 Gastrointestinal hemorrhage, unspecified: Secondary | ICD-10-CM | POA: Diagnosis not present

## 2023-12-30 LAB — CBC
HCT: 28.1 % — ABNORMAL LOW (ref 36.0–46.0)
Hemoglobin: 8.2 g/dL — ABNORMAL LOW (ref 12.0–15.0)
MCH: 27.7 pg (ref 26.0–34.0)
MCHC: 29.2 g/dL — ABNORMAL LOW (ref 30.0–36.0)
MCV: 94.9 fL (ref 80.0–100.0)
Platelets: 483 K/uL — ABNORMAL HIGH (ref 150–400)
RBC: 2.96 MIL/uL — ABNORMAL LOW (ref 3.87–5.11)
RDW: 16.1 % — ABNORMAL HIGH (ref 11.5–15.5)
WBC: 9.5 K/uL (ref 4.0–10.5)
nRBC: 0.2 % (ref 0.0–0.2)

## 2023-12-30 LAB — GLUCOSE, CAPILLARY: Glucose-Capillary: 125 mg/dL — ABNORMAL HIGH (ref 70–99)

## 2023-12-30 LAB — BASIC METABOLIC PANEL WITH GFR
Anion gap: 11 (ref 5–15)
BUN: 9 mg/dL (ref 8–23)
CO2: 23 mmol/L (ref 22–32)
Calcium: 8 mg/dL — ABNORMAL LOW (ref 8.9–10.3)
Chloride: 102 mmol/L (ref 98–111)
Creatinine, Ser: 0.82 mg/dL (ref 0.44–1.00)
GFR, Estimated: >60 mL/min (ref >60)
Glucose, Bld: 167 mg/dL — ABNORMAL HIGH (ref 70–99)
Potassium: 3.4 mmol/L — ABNORMAL LOW (ref 3.5–5.1)
Sodium: 136 mmol/L (ref 135–145)

## 2023-12-30 MED ORDER — PANTOPRAZOLE SODIUM 40 MG PO TBEC
40.0000 mg | DELAYED_RELEASE_TABLET | Freq: Two times a day (BID) | ORAL | 1 refills | Status: AC
  Filled 2023-12-30: qty 60, 30d supply, fill #0

## 2023-12-30 MED ORDER — POTASSIUM CHLORIDE CRYS ER 20 MEQ PO TBCR
40.0000 meq | EXTENDED_RELEASE_TABLET | Freq: Once | ORAL | Status: AC
Start: 2023-12-30 — End: 2023-12-30
  Administered 2023-12-30: 40 meq via ORAL
  Filled 2023-12-30: qty 2

## 2023-12-30 NOTE — Plan of Care (Signed)
  Problem: Education: Goal: Ability to describe self-care measures that may prevent or decrease complications (Diabetes Survival Skills Education) will improve Outcome: Progressing   Problem: Coping: Goal: Ability to adjust to condition or change in health will improve Outcome: Progressing   Problem: Health Behavior/Discharge Planning: Goal: Ability to identify and utilize available resources and services will improve Outcome: Progressing   Problem: Nutritional: Goal: Maintenance of adequate nutrition will improve Outcome: Progressing   

## 2023-12-30 NOTE — Plan of Care (Signed)
  Problem: Education: Goal: Ability to describe self-care measures that may prevent or decrease complications (Diabetes Survival Skills Education) will improve Outcome: Progressing   Problem: Skin Integrity: Goal: Risk for impaired skin integrity will decrease Outcome: Progressing   Problem: Activity: Goal: Risk for activity intolerance will decrease Outcome: Progressing   Problem: Coping: Goal: Level of anxiety will decrease Outcome: Progressing   Problem: Safety: Goal: Ability to remain free from injury will improve Outcome: Progressing   Problem: Skin Integrity: Goal: Risk for impaired skin integrity will decrease Outcome: Progressing

## 2023-12-30 NOTE — Progress Notes (Signed)
 DISCHARGE NOTE HOME Tammy Good to be discharged Home per MD order. Discussed prescriptions and follow up appointments with the patient. Prescriptions given to patient; medication list explained in detail. Patient verbalized understanding.  Skin clean, dry and intact without evidence of skin break down, no evidence of skin tears noted. IV catheter discontinued intact. Site without signs and symptoms of complications. Dressing and pressure applied. Pt denies pain at the site currently. No complaints noted.  PT has left BKA and prostesis see LDA regarding sheath placement. Patient free of lines, drains, and wounds.   An After Visit Summary (AVS) was printed and given to the patient. Patient escorted via wheelchair, and discharged home via private auto.  Peyton SHAUNNA Pepper, RN

## 2023-12-30 NOTE — Discharge Summary (Signed)
 PATIENT DETAILS Name: Tammy Good Age: 73 y.o. Sex: female Date of Birth: 1950-10-16 MRN: [id number]. Admitting Physician: Brayton GORMAN Lye, MD ERE:Dnluy, Garnette, MD  Admit Date: 12/28/2023 Discharge date: 12/30/2023  Recommendations for Outpatient Follow-up:  Follow up with PCP in 1-2 weeks Please obtain CMP/CBC in one week  Admitted From:  Home  Disposition: Home   Discharge Condition: good  CODE STATUS:   Code Status: Full Code   Diet recommendation:  Diet Order             Diet - low sodium heart healthy           Diet Carb Modified           Diet regular Fluid consistency: Thin  Diet effective now                    Brief Summary: 73 year old with history of CAD s/p PCI 2015, PAD-numerous PCI's-left BKA, chronic HFpEF, DM-2, HLD, panhypopituitarism-who had several days of black starry stools (prior to this hospitalization)-presented with weakness/fatigue-found to have a hemoglobin of 5.6.  Thought to have upper GI bleeding with acute blood loss anemia.  Brief Hospital Course: Upper GI bleeding with acute blood loss anemia Secondary to AVMs (seen on EGD on 10/7-s/p APC)-likely exacerbated by being on Eliquis /Plavix  Hb stable after 2 units of PRBC-last transfused on 10/6 Remains on PPI Per GI-okay to restart anticoagulation today-has Hb stable and no longer bleeding Discussed with primary cardiologist (Dr. Anner) and primary vascular surgeon (Dr. Gretta) over the phone on 10/8-okay to leave her off the Plavix -and on Eliquis . PCP to recheck CBC in 1 week I have asked patient to observe the color of her stools-and in the future if appears melanotic/black/bloody to seek immediate medical attention.  CAD s/p PCI 2015 No chest pain or shortness of breath Mildly elevated troponins-trend flat-likely demand ischemia Followed outpatient cardiologist  PAF Continue Eliquis   Chronic HFrEF Euvolemic  PAD-s/p numerous PCI-s/p left BKA Continue  statin See above regarding Plavix .  DM-2 (A1c 3.6 on 10/6) CBG stable on SSI A1c 3.6-not sure if this was drawn after PRBC was given and could be erroneous Resume oral hypoglycemics on discharge and follow-up with PCP  HLD Statin  Panhypopituitarism Levothyroxine /Decadron .  Class 1 Obesity: Estimated body mass index is 31.66 kg/m as calculated from the following:   Height as of this encounter: 5' 7 (1.702 m).   Weight as of this encounter: 91.7 kg.     Discharge Diagnoses:  Principal Problem:   Symptomatic anemia Active Problems:   GI bleed   CAD S/P percutaneous coronary angioplasty   Elevated troponin   Diabetes mellitus, type 2 (HCC)   Prolonged QT interval   Renal insufficiency   Paroxysmal atrial fibrillation (HCC)   Chronic anticoagulation   Chronic systolic CHF (congestive heart failure) (HCC)   PAD (peripheral artery disease)   Hyperlipidemia associated with type 2 diabetes mellitus (HCC)   Panhypopituitarism   Obesity (BMI 30-39.9)   Discharge Instructions:  Activity:  As tolerated  Discharge Instructions     Call MD for:  difficulty breathing, headache or visual disturbances   Complete by: As directed    Call MD for:  extreme fatigue   Complete by: As directed    Diet - low sodium heart healthy   Complete by: As directed    Diet Carb Modified   Complete by: As directed    Discharge instructions   Complete by: As directed  Follow with Primary MD  Nichole Senior, MD in 1-2 weeks  Please get a complete blood count and chemistry panel checked by your Primary MD at your next visit, and again as instructed by your Primary MD.  Get Medicines reviewed and adjusted: Please take all your medications with you for your next visit with your Primary MD  Laboratory/radiological data: Please request your Primary MD to go over all hospital tests and procedure/radiological results at the follow up, please ask your Primary MD to get all Hospital records  sent to his/her office.  In some cases, they will be blood work, cultures and biopsy results pending at the time of your discharge. Please request that your primary care M.D. follows up on these results.  Also Note the following: If you experience worsening of your admission symptoms, develop shortness of breath, life threatening emergency, suicidal or homicidal thoughts you must seek medical attention immediately by calling 911 or calling your MD immediately  if symptoms less severe.  You must read complete instructions/literature along with all the possible adverse reactions/side effects for all the Medicines you take and that have been prescribed to you. Take any new Medicines after you have completely understood and accpet all the possible adverse reactions/side effects.   Do not drive when taking Pain medications or sleeping medications (Benzodaizepines)  Do not take more than prescribed Pain, Sleep and Anxiety Medications. It is not advisable to combine anxiety,sleep and pain medications without talking with your primary care practitioner  Special Instructions: If you have smoked or chewed Tobacco  in the last 2 yrs please stop smoking, stop any regular Alcohol   and or any Recreational drug use.  Wear Seat belts while driving.  Please note: You were cared for by a hospitalist during your hospital stay. Once you are discharged, your primary care physician will handle any further medical issues. Please note that NO REFILLS for any discharge medications will be authorized once you are discharged, as it is imperative that you return to your primary care physician (or establish a relationship with a primary care physician if you do not have one) for your post hospital discharge needs so that they can reassess your need for medications and monitor your lab values.   Increase activity slowly   Complete by: As directed       Allergies as of 12/30/2023       Reactions   Strawberry Extract  Anaphylaxis, Itching, Swelling   Mouth swells and gets itchy        Medication List     STOP taking these medications    clopidogrel  75 MG tablet Commonly known as: PLAVIX    nitrofurantoin (macrocrystal-monohydrate) 100 MG capsule Commonly known as: MACROBID       TAKE these medications    atorvastatin  40 MG tablet Commonly known as: LIPITOR Take 1 tablet (40 mg total) by mouth daily at 6 PM.   B-D UF III MINI PEN NEEDLES 31G X 5 MM Misc Generic drug: Insulin  Pen Needle Inject into the skin.   Basaglar  KwikPen 100 UNIT/ML Inject 24 Units into the skin at bedtime. What changed: how much to take   cycloSPORINE 0.05 % ophthalmic emulsion Commonly known as: RESTASIS Place 1 drop into both eyes 2 (two) times daily as needed (dry eyes).   dapagliflozin  propanediol 10 MG Tabs tablet Commonly known as: FARXIGA  Take 10 mg by mouth daily.   dexamethasone  0.5 MG tablet Commonly known as: DECADRON  Take 0.5 mg by mouth daily.  diclofenac  Sodium 1 % Gel Commonly known as: VOLTAREN  Apply 4 g topically 4 (four) times daily. What changed:  when to take this reasons to take this   Eliquis  5 MG Tabs tablet Generic drug: apixaban  TAKE 1 TABLET BY MOUTH TWICE A DAY   ferrous sulfate  325 (65 FE) MG tablet Take 325 mg by mouth at bedtime.   FreeStyle Libre 2 Sensor Misc Apply topically every 14 (fourteen) days.   levothyroxine  125 MCG tablet Commonly known as: SYNTHROID  Take 1 tablet (125 mcg total) by mouth daily before breakfast.   midodrine  5 MG tablet Commonly known as: PROAMATINE  Take 1 tablet (5 mg total) by mouth 2 (two) times daily with breakfast and lunch. What changed:  when to take this additional instructions   nitroGLYCERIN  0.4 MG SL tablet Commonly known as: NITROSTAT  Place 0.4 mg under the tongue every 5 (five) minutes as needed for chest pain.   NovoLOG  FlexPen 100 UNIT/ML FlexPen Generic drug: insulin  aspart Inject 2-10 Units into the skin 3  (three) times daily with meals. Sliding scale insulin    pantoprazole  40 MG tablet Commonly known as: PROTONIX  Take 1 tablet (40 mg total) by mouth 2 (two) times daily.   polyethylene glycol powder 17 GM/SCOOP powder Commonly known as: GLYCOLAX /MIRALAX  Take 1 capful with water (17 g) by mouth daily.   pregabalin 50 MG capsule Commonly known as: LYRICA Take 50 mg by mouth daily.        Follow-up Information     Nichole Senior, MD. Schedule an appointment as soon as possible for a visit in 1 week(s).   Specialty: Endocrinology Contact information: [address] [place] [address] [phone]         Rollin Dover, MD. Schedule an appointment as soon as possible for a visit in 2 week(s).   Specialty: Gastroenterology Contact information: [address] [place] [address] [phone]                Allergies  Allergen Reactions   Strawberry Extract Anaphylaxis, Itching and Swelling    Mouth swells and gets itchy     Other Procedures/Studies: DG Chest Portable 1 View Result Date: 12/28/2023 EXAM: 1 VIEW(S) XRAY OF THE CHEST 12/28/2023 06:16:50 AM COMPARISON: None available. CLINICAL HISTORY: chest pain. Chest discomfort,nausea,dizziness,hx chf,mi; rover FINDINGS: LUNGS AND PLEURA: No focal pulmonary opacity. No pulmonary edema. No pleural effusion. No pneumothorax. HEART AND MEDIASTINUM: Cardiomegaly. Atherosclerotic calcifications of the aortic arch. BONES AND SOFT TISSUES: No acute osseous abnormality. IMPRESSION: 1. No acute cardiopulmonary process identified. 2. Cardiomegaly. 3. Atherosclerotic calcifications of the aortic arch. Electronically signed by: Waddell Calk MD 12/28/2023 06:23 AM EDT RP Workstation: [id number]     TODAY-DAY OF DISCHARGE:  Subjective:   [place] today has no headache,no chest abdominal pain,no new weakness tingling or numbness, feels much better wants to go home today.   Objective:   Blood  pressure (!) 120/53, pulse 80, temperature 98.2 F (36.8 C), temperature source Oral, resp. rate 18, height 5' 7 (1.702 m), weight 91.7 kg, SpO2 95%.  Intake/Output Summary (Last 24 hours) at 12/30/2023 0856 Last data filed at 12/30/2023 0014 Gross per 24 hour  Intake 200 ml  Output 900 ml  Net -700 ml   Filed Weights   12/28/23 2225 12/29/23 0449 12/30/23 0500  Weight: 90.1 kg 90.2 kg 91.7 kg    Exam: Awake Alert, Oriented *3, No new F.N deficits, Normal affect .AT,PERRAL Supple Neck,No JVD, No cervical lymphadenopathy appriciated.  Symmetrical Chest wall  movement, Good air movement bilaterally, CTAB RRR,No Gallops,Rubs or new Murmurs, No Parasternal Heave +ve B.Sounds, Abd Soft, Non tender, No organomegaly appriciated, No rebound -guarding or rigidity. No Cyanosis, Clubbing or edema, No new Rash or bruise   PERTINENT RADIOLOGIC STUDIES: No results found.   PERTINENT LAB RESULTS: CBC: Recent Labs    12/29/23 0430 12/30/23 0232  WBC 8.7 9.5  HGB 8.5* 8.2*  HCT 28.1* 28.1*  PLT 470* 483*   CMET CMP     Component Value Date/Time   NA 136 12/30/2023 0232   NA 140 07/03/2021 1250   K 3.4 (L) 12/30/2023 0232   CL 102 12/30/2023 0232   CO2 23 12/30/2023 0232   GLUCOSE 167 (H) 12/30/2023 0232   BUN 9 12/30/2023 0232   BUN 13 07/03/2021 1250   CREATININE 0.82 12/30/2023 0232   CREATININE 0.84 11/26/2021 1143   CALCIUM  8.0 (L) 12/30/2023 0232   PROT 5.2 (L) 12/28/2023 0600   PROT 6.9 07/03/2021 1250   ALBUMIN  2.7 (L) 12/28/2023 0600   ALBUMIN  3.3 (L) 07/03/2021 1250   AST 16 12/28/2023 0600   ALT 13 12/28/2023 0600   ALKPHOS 78 12/28/2023 0600   BILITOT 1.1 12/28/2023 0600   BILITOT 0.6 07/03/2021 1250   EGFR 49 (L) 07/03/2021 1250   GFRNONAA >60 12/30/2023 0232    GFR Estimated Creatinine Clearance: 73.1 mL/min (by C-G formula based on SCr of 0.82 mg/dL). Recent Labs    12/28/23 0600  LIPASE 18   No results for input(s): CKTOTAL, CKMB,  CKMBINDEX, TROPONINI in the last 72 hours. Invalid input(s): POCBNP No results for input(s): DDIMER in the last 72 hours. Recent Labs    12/28/23 0600  HGBA1C 3.6*   No results for input(s): CHOL, HDL, LDLCALC, TRIG, CHOLHDL, LDLDIRECT in the last 72 hours. No results for input(s): TSH, T4TOTAL, T3FREE, THYROIDAB in the last 72 hours.  Invalid input(s): FREET3 No results for input(s): VITAMINB12, FOLATE, FERRITIN, TIBC, IRON, RETICCTPCT in the last 72 hours. Coags: No results for input(s): INR in the last 72 hours.  Invalid input(s): PT Microbiology: Recent Results (from the past 240 hours)  Resp panel by RT-PCR (RSV, Flu A&B, Covid) Anterior Nasal Swab     Status: None   Collection Time: 12/28/23  6:14 AM   Specimen: Anterior Nasal Swab  Result Value Ref Range Status   SARS Coronavirus 2 by RT PCR NEGATIVE NEGATIVE Final   Influenza A by PCR NEGATIVE NEGATIVE Final   Influenza B by PCR NEGATIVE NEGATIVE Final    Comment: (NOTE) The Xpert Xpress SARS-CoV-2/FLU/RSV plus assay is intended as an aid in the diagnosis of influenza from Nasopharyngeal swab specimens and should not be used as a sole basis for treatment. Nasal washings and aspirates are unacceptable for Xpert Xpress SARS-CoV-2/FLU/RSV testing.  Fact Sheet for Patients: BloggerCourse.com  Fact Sheet for Healthcare Providers: SeriousBroker.it  This test is not yet approved or cleared by the [place]  FDA and has been authorized for detection and/or diagnosis of SARS-CoV-2 by FDA under an Emergency Use Authorization (EUA). This EUA will remain in effect (meaning this test can be used) for the duration of the COVID-19 declaration under Section 564(b)(1) of the Act, 21 U.S.C. section 360bbb-3(b)(1), unless the authorization is terminated or revoked.     Resp Syncytial Virus by PCR NEGATIVE NEGATIVE Final    Comment:  (NOTE) Fact Sheet for Patients: BloggerCourse.com  Fact Sheet for Healthcare Providers: SeriousBroker.it  This test is not yet approved or cleared  by the [place]  FDA and has been authorized for detection and/or diagnosis of SARS-CoV-2 by FDA under an Emergency Use Authorization (EUA). This EUA will remain in effect (meaning this test can be used) for the duration of the COVID-19 declaration under Section 564(b)(1) of the Act, 21 U.S.C. section 360bbb-3(b)(1), unless the authorization is terminated or revoked.  Performed at [facility], [address] [place], [place] [address]     FURTHER DISCHARGE INSTRUCTIONS:  Get Medicines reviewed and adjusted: Please take all your medications with you for your next visit with your Primary MD  Laboratory/radiological data: Please request your Primary MD to go over all hospital tests and procedure/radiological results at the follow up, please ask your Primary MD to get all Hospital records sent to his/her office.  In some cases, they will be blood work, cultures and biopsy results pending at the time of your discharge. Please request that your primary care M.D. goes through all the records of your hospital data and follows up on these results.  Also Note the following: If you experience worsening of your admission symptoms, develop shortness of breath, life threatening emergency, suicidal or homicidal thoughts you must seek medical attention immediately by calling 911 or calling your MD immediately  if symptoms less severe.  You must read complete instructions/literature along with all the possible adverse reactions/side effects for all the Medicines you take and that have been prescribed to you. Take any new Medicines after you have completely understood and accpet all the possible adverse reactions/side effects.   Do not drive when taking Pain medications or sleeping medications  (Benzodaizepines)  Do not take more than prescribed Pain, Sleep and Anxiety Medications. It is not advisable to combine anxiety,sleep and pain medications without talking with your primary care practitioner  Special Instructions: If you have smoked or chewed Tobacco  in the last 2 yrs please stop smoking, stop any regular Alcohol   and or any Recreational drug use.  Wear Seat belts while driving.  Please note: You were cared for by a hospitalist during your hospital stay. Once you are discharged, your primary care physician will handle any further medical issues. Please note that NO REFILLS for any discharge medications will be authorized once you are discharged, as it is imperative that you return to your primary care physician (or establish a relationship with a primary care physician if you do not have one) for your post hospital discharge needs so that they can reassess your need for medications and monitor your lab values.  Total Time spent coordinating discharge including counseling, education and face to face time equals greater than 30 minutes.  SignedBETHA Donalda Applebaum 12/30/2023 8:56 AM

## 2024-01-01 ENCOUNTER — Telehealth: Payer: Self-pay | Admitting: Cardiology

## 2024-01-01 NOTE — Telephone Encounter (Signed)
 Notes from D/C summary Note Dr Donalda Applebaum- 12/30/23:  Discussed with primary cardiologist (Dr. Anner) and primary vascular surgeon (Dr. Gretta) over the phone on 10/8-okay to leave her off the Plavix -and on Eliquis . PCP to recheck CBC in 1 week I have asked patient to observe the color of her stools-and in the future if appears melanotic/black/bloody to seek immediate medical attention.   PAF Continue Eliquis   S/w the patient and informed her that The Clopidogrel /Plavix  has been discontinued - she should continue to take the Eliquis .   She verbalized understanding.  She states that her stools still look about the same as they did in the hospital, they are dark. Informed her to monitor the color and if it seems to be getting darker- black and or bloody, then she needs to seek medical attention. She verbalized understanding.

## 2024-01-01 NOTE — Telephone Encounter (Signed)
 Yes.  I did talk to Dr. Raenelle -plan was to just use Eliquis  alone.

## 2024-01-01 NOTE — Telephone Encounter (Signed)
 Pt of Dr. Anner. At ER visit on 12/28/23, Pt was told that her Eliquis  and Farxiga  may be stopped. The physicians would speak to Dr. Anner. D/C of her Clopidogrel  was not discussed as D/Cing. After speaking with the Pt she confirmed this. Please advise on this.

## 2024-01-01 NOTE — Telephone Encounter (Signed)
*  STAT* If patient is at the pharmacy, call can be transferred to refill team.   1. Which medications need to be refilled? (please list name of each medication and dose if known) clopidogrel  (PLAVIX ) 75 MG tablet [[id number]]  DISCONTINUED    2. Would you like to learn more about the convenience, safety, & potential cost savings by using the [facility]? No     3. Are you open to using the [facility] (Type [facility]. No   4. Which pharmacy/location (including street and city if local pharmacy) is medication to be sent to?[id number] - [place], [place] - [address]    5. Do they need a 30 day or 90 day supply? 90 day  Pt has appt 02/10/24.

## 2024-01-06 ENCOUNTER — Other Ambulatory Visit: Payer: Self-pay | Admitting: Cardiology

## 2024-01-29 ENCOUNTER — Encounter: Payer: Self-pay | Admitting: Podiatry

## 2024-01-29 ENCOUNTER — Ambulatory Visit: Payer: Medicare Other | Admitting: Podiatry

## 2024-01-29 DIAGNOSIS — B351 Tinea unguium: Secondary | ICD-10-CM

## 2024-01-29 DIAGNOSIS — Z7901 Long term (current) use of anticoagulants: Secondary | ICD-10-CM | POA: Diagnosis not present

## 2024-01-29 DIAGNOSIS — E1149 Type 2 diabetes mellitus with other diabetic neurological complication: Secondary | ICD-10-CM | POA: Diagnosis not present

## 2024-01-29 NOTE — Progress Notes (Signed)
 Subjective: Chief Complaint  Patient presents with   Diabetes    DFC IDDM A1C 6.1. Toenail trim right foot only. BKA on the left.   73 year old female presents the office with above concerns.  States that she has been doing well, no open lesions or new concerns today her nails to become elongated and they start to rub.  She has no concerns otherwise today.  Objective: AAO x3, NAD Palpable DP pulse on the right foot. BKA on the left Sensation decreased on the right Chronic edema present in the right lower extremity but is no erythema or warmth.  There are no open lesions present. Unchanged. Nails of the right are hypertrophic, dystrophic with brown discoloration and several debris present for scar discomfort x 5 on the right foot. No pain with calf compression, swelling, warmth, erythema  Assessment: Right foot, ankle swelling; PAD; onychomycosis on anticoagulation; neuropathy  Plan: -All treatment options discussed with the patient including all alternatives, risks, complications.  -Continue to follow-up with vascular surgery. -Encouraged elevation today to help with the swelling. -Sharply debrided nails x 5 without any complications or bleeding -Daily foot inspection, glucose control  Return in about 3 months (around 04/30/2024) for nail check; foot exam.  Donnice JONELLE Fees DPM

## 2024-02-10 ENCOUNTER — Ambulatory Visit: Attending: Cardiovascular Disease | Admitting: Cardiology

## 2024-02-10 ENCOUNTER — Encounter: Payer: Self-pay | Admitting: Cardiology

## 2024-02-10 VITALS — BP 136/72 | HR 87 | Ht 67.0 in | Wt 211.4 lb

## 2024-02-10 DIAGNOSIS — I2510 Atherosclerotic heart disease of native coronary artery without angina pectoris: Secondary | ICD-10-CM | POA: Insufficient documentation

## 2024-02-10 DIAGNOSIS — I10 Essential (primary) hypertension: Secondary | ICD-10-CM | POA: Diagnosis present

## 2024-02-10 DIAGNOSIS — E785 Hyperlipidemia, unspecified: Secondary | ICD-10-CM | POA: Diagnosis present

## 2024-02-10 DIAGNOSIS — I214 Non-ST elevation (NSTEMI) myocardial infarction: Secondary | ICD-10-CM | POA: Insufficient documentation

## 2024-02-10 DIAGNOSIS — Z9861 Coronary angioplasty status: Secondary | ICD-10-CM | POA: Diagnosis present

## 2024-02-10 DIAGNOSIS — I493 Ventricular premature depolarization: Secondary | ICD-10-CM | POA: Insufficient documentation

## 2024-02-10 DIAGNOSIS — Z7901 Long term (current) use of anticoagulants: Secondary | ICD-10-CM | POA: Insufficient documentation

## 2024-02-10 DIAGNOSIS — I739 Peripheral vascular disease, unspecified: Secondary | ICD-10-CM | POA: Diagnosis not present

## 2024-02-10 DIAGNOSIS — E669 Obesity, unspecified: Secondary | ICD-10-CM | POA: Insufficient documentation

## 2024-02-10 DIAGNOSIS — I480 Paroxysmal atrial fibrillation: Secondary | ICD-10-CM | POA: Insufficient documentation

## 2024-02-10 DIAGNOSIS — E1169 Type 2 diabetes mellitus with other specified complication: Secondary | ICD-10-CM | POA: Diagnosis present

## 2024-02-10 NOTE — Progress Notes (Signed)
 Cardiology Office Note:  .   Date:  02/14/2024  ID:  Tammy Good, DOB May 25, 1950, MRN [id number] PCP: Nichole Senior, MD  [facility] Providers Cardiologist:  Alm Clay, MD     Chief Complaint  Patient presents with   Follow-up    Essentially 2-year follow-up.   Coronary Artery Disease    No active angina   Atrial Fibrillation    No recurrent episodes.    Patient Profile: .     Tammy Good is a mildly obese 73 y.o. female  with a PMH reviewed below who presents here for hospital follow-up but also up to year follow-up at the request of Nichole Senior, MD.  PMH: CAD - Subacute inferior STEMI June 2015: PCI to her RCA (resolute DES 3.0 x 38 mm postdilated to 3.35 mm), PTCA of the PDA & PAV-PLA.  (In the setting of sepsis) Non-STEMI 11/19/2020: PCI to LCx with 2.5 x 12 mm DES.  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 11/15/2020: Right SFA above-knee popliteal stent for CLI with a wound.  07/11/2021: Left SFA AT angioplasty with TP trunk angioplasty and PT angioplasty for CLI with a wound.  11/20/2022: Shockwave Lithotripsy of her Right Popliteal Artery distal to the stent  09/10/2023: Right above and behind knee Popliteal Artery Shockwave Lithotripsy (4.5 mm x 60 mm) followed by DCB PTA (5.0 mm x 80 mm Ranger balloon) PAF-not on beta-blocker.  On Eliquis . DM-2 HTN HLD Hypothyroidism    I last saw Tammy Good in person in June 2022 prior to her MI in August of that year.  Her last visit with me was a Consulting Civil Engineer on 02/26/2022 as a follow-up from her left TKA.  She was doing well from a cardiac standpoint with no active angina symptoms.  No heart failure symptoms.  With no longer on metoprolol  or ACE inhibitor because of hypotension and was on midodrine  at that time.  She was on atorvastatin , Farxiga  and Plavix  because of PAD.  There is no symptoms of A-fib.  She is looking forward to  getting her prosthesis and wanted to get back walking to get back her mobility.  Denied any cardiac symptoms.    She was seen by Dr. Gretta in June and went for Peripheral Vascular Procedure 09/10/2023 (see above).  She was hospitalized October 6-8, 2025 for GI bleed-several days of melena with hemoglobin of 5.6.  She was given 2 units of PRBC.  AVMs treated, and cleared to restart anticoagulation.  I was contacted by Dr. Raenelle (her attending hospitalist) about whether or not she can hold Eliquis /Plavix .  He also talked to Dr. Gretta and we both agreed that it was okay to hold her Plavix  and Eliquis .  Restart once stable.  Despite having severely anemia, she did not have any angina or CHF symptoms..  She underwent endoscopy with cauterization of gastric/duodenal lesions.  She was just seen by Dr. Rollin [facility]) on October 22 for hospital follow-up.  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  Hemoglobin at time of discharge was 8.2 with recent blood work from Dr. Hazen office showing hemoglobin of 8.9.  Had noted more constipation since taking iron pills but this was not that different from before.  He prescribed Linzess in 4-week follow-up CBC  Subjective  Discussed the use of AI scribe software for clinical note transcription with the  patient, who gave verbal consent to proceed.  History of Present Illness Tammy Good is a 73 year old female with coronary artery disease and atrial fibrillation who presents for follow-up after recent hospitalization for gastrointestinal bleeding.  She was hospitalized on October 6th due to a significant drop in hemoglobin to 5 g/dL. An endoscopy during the hospitalization revealed tears in the gastrointestinal tract, which were cauterized to stop the bleeding. She received two units of blood during this admission. Her hemoglobin has since improved to 10.5 g/dL as of November 82uy. She denies  symptoms of heart failure, including shortness of breath, orthopnea, or swelling in the legs. She reports that her blood pressure is usually around 120-130 mmHg.  She has a history of coronary artery disease with a stent placed in the circumflex artery in 2015. She underwent a heart catheterization in August 2022 following a minor myocardial infarction related to a procedure. No recent episodes of chest pain, pressure, or angina.  She has atrial fibrillation but no recent palpitations, heart racing, or episodes of atrial fibrillation. She is currently taking Eliquis  for this condition.  No pain or sores in the leg since the procedure. She had a left leg amputation in October 2023 and received a prosthetic in December 2023.  Her current medications include atorvastatin  40 mg for cholesterol, Basaglar  insulin , Farxiga  10 mg, and Novolog  insulin  for diabetes. She was previously on Plavix  but it was discontinued in October 2025. There is uncertainty about her current use of midodrine  (listed as taking 5 mg twice daily), which was previously prescribed for low blood pressure.  Cardiovascular ROS: no chest pain or dyspnea on exertion positive for - a little bit of fatigue and deconditioning.  She has not been very active.  She has left leg BKA, and has a hard time getting back on her feet.  But has otherwise been doing fine. negative for - edema, irregular heartbeat, orthopnea, palpitations, paroxysmal nocturnal dyspnea, rapid heart rate, shortness of breath, or syncope or near syncope or TIA or emesis fugax, claudication.  Further episodes of melena, hematochezia or hematuria.  ROS:  Review of Systems - Negative except noted above    Objective   Medications - Eliquis  5 mg twice daily - Atorvastatin  40 mg - Basaglar  insulin  24 units nightly;- Novolog  insulin  sliding scale; - Farxiga  10 mg - Midodrine  5 mg twice daily - Synthroid  125 mcg daily - Ferrous sulfate  325 mg nightly along with MiraLAX  17  g daily.   - Lyrica 50 mg nightly - Dexamethasone  0.5 mg daily  Has not required as needed NTG.  Studies Reviewed: [name]   EKG Interpretation Date/Time:  Wednesday February 10 2024 15:31:18 EST Ventricular Rate:  87 PR Interval:  186 QRS Duration:  106 QT Interval:  406 QTC Calculation: 488 R Axis:   -66  Text Interpretation: Sinus rhythm with occasional Premature ventricular complexes Left axis deviation Moderate voltage criteria for LVH, may be normal variant ( R in aVL , Cornell product ) Inferior-posterior infarct , age undetermined Anterolateral infarct , age undetermined When compared with ECG of 28-Dec-2023 05:54, PREVIOUS ECG IS PRESENT Confirmed by Anner Lenis ([id number]) on 02/10/2024 4:24:15 PM       Latest Ref Rng & Units 12/30/2023    2:32 AM 12/29/2023    4:30 AM 12/28/2023   10:59 PM  CBC  WBC 4.0 - 10.5 K/uL 9.5  8.7    Hemoglobin 12.0 - 15.0 g/dL 8.2  8.5  8.8   Hematocrit  36.0 - 46.0 % 28.1  28.1  29.0   Platelets 150 - 400 K/uL 483  470     Results LABS (12/28/2023): Hgb 8.2, A1c 3.6, K+ 3.4, Cr 0.82 (02/08/2024): Hgb 10.5, WBC 10.2, PLT 436 (03/04/2023): TC 112, TG 92, HDL 39, LDL 55  TEE : (Surveillance for SBE in setting of cellulitis and sepsis): EF 40 to 45%.  Mild reduced function.  Global HK.  Mild LA dilation with no LAA thrombus..  Normal RV size and function.  GR 2 plaque in descending aorta.  No vegetation.  (04/26/2021)  Echocardiogram: LVEF 40 to 45%.  Severe HK of the entire inferolateral and inferior wall.  Moderate LA dilation.  Normal RV size and function.  Essentially normal valves.  Normal RAP.  EF reduced from previous study 50 to 55% with inferior hypokinesis/akinesis noted.  (11/16/2020)  Cardiac CATH-PCI: Severe 2 V CAD => 95% prox LCx & multifocal D1 90% stenosis.  Patent RCA stent.  Mild to moderate LAD and RCA disease. => Challenging/successful DES PCI to prox LCx with Onyx frontier DES 2.5 x 12 mm stent.   (11/19/2020) Diagnostic        Intervention    Risk Assessment/Calculations:    CHA2DS2-VASc Score = 4   This indicates a 4.8% annual risk of stroke. The patient's score is based upon: CHF History: 0 HTN History: 0 Diabetes History: 1 Stroke History: 0 Vascular Disease History: 1 Age Score: 1 Gender Score: 1          Physical Exam:   VS:  BP 136/72   Pulse 87   Ht 5' 7 (1.702 m)   Wt 211 lb 6.4 oz (95.9 kg)   SpO2 96%   BMI 33.11 kg/m    Wt Readings from Last 3 Encounters:  02/10/24 211 lb 6.4 oz (95.9 kg)  12/30/23 202 lb 2.6 oz (91.7 kg)  10/30/23 216 lb (98 kg)      GEN: Well nourished, well developed in no acute distress; moderately obese. NECK: No JVD; No carotid bruits CARDIAC: Normal S1, S2; RRR with occasional ectopy.,  NO murmurs, rubs, gallops RESPIRATORY:  Clear to auscultation without rales, wheezing or rhonchi ; nonlabored, good air movement. ABDOMEN: Soft, non-tender, non-distended EXTREMITIES:  No edema; left BKA.  Has a [employer] prosthesis     ASSESSMENT AND PLAN: .    Problem List Items Addressed This Visit       Cardiology Problems   CAD S/P percutaneous coronary angioplasty - Primary (Chronic)   Inferior MI and 2015 with PCI to the RCA, PCI to the LCx in 2022.  No active angina or cardiac symptoms.  No recent changes in cardiac symptoms. - Continue current management without changes: On atorvastatin  40 mg daily along with Jardiance 10 mg daily. -No longer on Plavix  because of GI bleed issues. Has not been on either beta-blocker or ACE inhibitor/ARB in years due to history of hypotension.  Blood pressure seem to build up higher so we may be able to potentially consider restarting.      Relevant Orders   EKG 12-Lead (Completed)   Essential hypertension (Chronic)   Blood pressure elevated today, typically around 120-130 mmHg. Currently not on antihypertensive medication. Midodrine  use unclear, possibly for low blood  pressure. - Clarify midodrine  use with Dr. Nichole. - Monitor blood pressure.      Hyperlipidemia associated with type 2 diabetes mellitus (HCC) (Chronic)   Diabetes managed with Basaglar  insulin , Farxiga , and Novolog  insulin . Recent  A1c was 3.6%.  Managed by Dr. Nichole - Continue current diabetes management regimen.  Remains on atorvastatin  40 mg daily with most recent lipid panel checked in December 2024 showing LDL of 55.  Likely due for follow-up labs with PCP this year.      Non-ST elevation (NSTEMI) myocardial infarction (HCC) (Chronic)   Both episodes of MI were in the setting of an ongoing illness.  Most recently in August 2022 where she had PCI to the LCx.  Mildly reduced EF on echo likely based on residual from RCA and LCx disease.      PAD (peripheral artery disease) (Chronic)   Monitor closely by Dr. Gretta.  Status post left BKA.  Recent procedure for right popliteal artery.    Aggressive risk modification with glycemic and lipid control by Dr. Nichole . No longer on any antihypertensives because of hypotension in the past.  Blood pressures are low but better now but still remains on midodrine .  Currently not on Plavix  because of GI bleed issues.  Will defer to Dr. Gretta.      Relevant Orders   EKG 12-Lead (Completed)   Paroxysmal atrial fibrillation (HCC) (Chronic)   No recent episodes of atrial fibrillation. Currently on Eliquis  for anticoagulation.  - Continue Eliquis  5 mg bid for anticoagulation. - As noted.  Not on beta-blocker or other rate control agents.  As long as she has no recurrent symptoms.  No need to be more aggressive treating.  Okay to hold Eliquis  2 to 3 days for surgeries or procedures.  Also okay to hold for bleeding.      PVC's (premature ventricular contractions) (Chronic)   Asymptomatic.  No longer on beta-blocker.        Other   Chronic anticoagulation (Chronic)   Okay to hold Eliquis  2 to 3 days preop for surgical procedures. Okay to hold  Eliquis  for GI bleed issues.  Restart once stable per GI      Obesity (BMI 30-39.9) (Chronic)   The patient understands the need to lose weight with diet and exercise. We have discussed specific strategies for this.              Follow-Up: Return in about 1 year (around 02/09/2025) for [employer], Routine follow up with me.  I spent 74 minutes in the care of Tammy Good today including reviewing labs (1 minute), reviewing outside labs from PCP via [facility] and scanned notes as well as the patient following up from patient portal (2 minutes), reviewing studies (most recent PV study, cardiac cath, echo TEE reviewed as well as GI procedure = 12 minutes), face to face time discussing treatment options (on 8 minutes), reviewing records from the last 2 years-GI and PCP clinic notes, peripheral vascular notes, hospitalizations (15 minutes), 16 minutes dictating, and documenting in the encounter.      Signed, Alm MICAEL Clay, MD, MS Alm Clay, M.D., M.S. Interventional Cardiologist  [facility] Pager # [phone]

## 2024-02-10 NOTE — Patient Instructions (Signed)
 Medication Instructions:  Your physician recommends that you continue on your current medications as directed. Please refer to the Current Medication list given to you today.  *If you need a refill on your cardiac medications before your next appointment, please call your pharmacy*  Lab Work: None ordered  If you have any lab test that is abnormal or we need to change your treatment, we will call you to review the results.  Testing/Procedures: None ordered  Follow-Up: At [facility], you and your health needs are our priority.  As part of our continuing mission to provide you with exceptional heart care, our providers are all part of one team.  This team includes your primary Cardiologist (physician) and Advanced Practice Providers or APPs (Physician Assistants and Nurse Practitioners) who all work together to provide you with the care you need, when you need it.  Your next appointment:   1 year(s)  Provider:   Alm Clay, MD    Thank you for choosing [facility]!!   [phone]   Other Instructions  Discuss with Dr. Nichole -- check if you should still be taking Midodrine  or Clopidogrel 

## 2024-02-14 ENCOUNTER — Encounter: Payer: Self-pay | Admitting: Cardiology

## 2024-02-14 NOTE — Assessment & Plan Note (Signed)
 Monitor closely by Dr. Gretta.  Status post left BKA.  Recent procedure for right popliteal artery.    Aggressive risk modification with glycemic and lipid control by Dr. Nichole . No longer on any antihypertensives because of hypotension in the past.  Blood pressures are low but better now but still remains on midodrine .  Currently not on Plavix  because of GI bleed issues.  Will defer to Dr. Gretta.

## 2024-02-14 NOTE — Assessment & Plan Note (Signed)
 Inferior MI and 2015 with PCI to the RCA, PCI to the LCx in 2022.  No active angina or cardiac symptoms.  No recent changes in cardiac symptoms. - Continue current management without changes: On atorvastatin  40 mg daily along with Jardiance 10 mg daily. -No longer on Plavix  because of GI bleed issues. Has not been on either beta-blocker or ACE inhibitor/ARB in years due to history of hypotension.  Blood pressure seem to build up higher so we may be able to potentially consider restarting.

## 2024-02-14 NOTE — Assessment & Plan Note (Signed)
 Blood pressure elevated today, typically around 120-130 mmHg. Currently not on antihypertensive medication. Midodrine  use unclear, possibly for low blood pressure. - Clarify midodrine  use with Dr. Nichole. - Monitor blood pressure.

## 2024-02-14 NOTE — Assessment & Plan Note (Addendum)
 No recent episodes of atrial fibrillation. Currently on Eliquis  for anticoagulation.  - Continue Eliquis  5 mg bid for anticoagulation. - As noted.  Not on beta-blocker or other rate control agents.  As long as she has no recurrent symptoms.  No need to be more aggressive treating.  Okay to hold Eliquis  2 to 3 days for surgeries or procedures.  Also okay to hold for bleeding.

## 2024-02-14 NOTE — Assessment & Plan Note (Signed)
 The patient understands the need to lose weight with diet and exercise. We have discussed specific strategies for this.

## 2024-02-14 NOTE — Assessment & Plan Note (Signed)
 Okay to hold Eliquis  2 to 3 days preop for surgical procedures. Okay to hold Eliquis  for GI bleed issues.  Restart once stable per GI

## 2024-02-14 NOTE — Assessment & Plan Note (Signed)
 Asymptomatic.  No longer on beta-blocker.

## 2024-02-14 NOTE — Assessment & Plan Note (Signed)
 Both episodes of MI were in the setting of an ongoing illness.  Most recently in August 2022 where she had PCI to the LCx.  Mildly reduced EF on echo likely based on residual from RCA and LCx disease.

## 2024-02-14 NOTE — Assessment & Plan Note (Addendum)
 Diabetes managed with Basaglar  insulin , Farxiga , and Novolog  insulin . Recent A1c was 3.6%.  Managed by Dr. Nichole - Continue current diabetes management regimen.  Remains on atorvastatin  40 mg daily with most recent lipid panel checked in December 2024 showing LDL of 55.  Likely due for follow-up labs with PCP this year.

## 2024-03-02 ENCOUNTER — Other Ambulatory Visit: Payer: Self-pay | Admitting: Cardiology

## 2024-03-02 DIAGNOSIS — I480 Paroxysmal atrial fibrillation: Secondary | ICD-10-CM

## 2024-03-02 NOTE — Telephone Encounter (Signed)
 Eliquis  5mg  refill request received. Patient is 73 years old, weight-95.9kg, Crea-0.82 on 12/30/23, Diagnosis-Afib, and last seen by Dr Anner on 02/10/24. Dose is appropriate based on dosing criteria. Will send in refill to requested pharmacy.

## 2024-03-08 ENCOUNTER — Encounter (HOSPITAL_COMMUNITY): Payer: Self-pay | Admitting: *Deleted

## 2024-03-08 ENCOUNTER — Other Ambulatory Visit: Payer: Self-pay

## 2024-03-08 ENCOUNTER — Emergency Department (HOSPITAL_COMMUNITY)

## 2024-03-08 ENCOUNTER — Inpatient Hospital Stay (HOSPITAL_COMMUNITY)
Admission: EM | Admit: 2024-03-08 | Discharge: 2024-03-14 | DRG: 193 | Disposition: A | Discharge status: Home Health | Attending: Internal Medicine | Admitting: Internal Medicine

## 2024-03-08 DIAGNOSIS — J440 Chronic obstructive pulmonary disease with acute lower respiratory infection: Secondary | ICD-10-CM | POA: Diagnosis present

## 2024-03-08 DIAGNOSIS — E876 Hypokalemia: Secondary | ICD-10-CM | POA: Diagnosis present

## 2024-03-08 DIAGNOSIS — Z7951 Long term (current) use of inhaled steroids: Secondary | ICD-10-CM

## 2024-03-08 DIAGNOSIS — I5043 Acute on chronic combined systolic (congestive) and diastolic (congestive) heart failure: Secondary | ICD-10-CM | POA: Diagnosis present

## 2024-03-08 DIAGNOSIS — Z89512 Acquired absence of left leg below knee: Secondary | ICD-10-CM

## 2024-03-08 DIAGNOSIS — E119 Type 2 diabetes mellitus without complications: Secondary | ICD-10-CM

## 2024-03-08 DIAGNOSIS — J9601 Acute respiratory failure with hypoxia: Principal | ICD-10-CM | POA: Diagnosis present

## 2024-03-08 DIAGNOSIS — Z9049 Acquired absence of other specified parts of digestive tract: Secondary | ICD-10-CM

## 2024-03-08 DIAGNOSIS — Z955 Presence of coronary angioplasty implant and graft: Secondary | ICD-10-CM

## 2024-03-08 DIAGNOSIS — I252 Old myocardial infarction: Secondary | ICD-10-CM

## 2024-03-08 DIAGNOSIS — I110 Hypertensive heart disease with heart failure: Secondary | ICD-10-CM | POA: Diagnosis present

## 2024-03-08 DIAGNOSIS — E785 Hyperlipidemia, unspecified: Secondary | ICD-10-CM | POA: Diagnosis present

## 2024-03-08 DIAGNOSIS — J208 Acute bronchitis due to other specified organisms: Secondary | ICD-10-CM | POA: Diagnosis present

## 2024-03-08 DIAGNOSIS — E1169 Type 2 diabetes mellitus with other specified complication: Secondary | ICD-10-CM | POA: Diagnosis present

## 2024-03-08 DIAGNOSIS — D649 Anemia, unspecified: Secondary | ICD-10-CM | POA: Diagnosis present

## 2024-03-08 DIAGNOSIS — E230 Hypopituitarism: Secondary | ICD-10-CM | POA: Diagnosis present

## 2024-03-08 DIAGNOSIS — E039 Hypothyroidism, unspecified: Secondary | ICD-10-CM | POA: Diagnosis present

## 2024-03-08 DIAGNOSIS — I480 Paroxysmal atrial fibrillation: Secondary | ICD-10-CM | POA: Diagnosis present

## 2024-03-08 DIAGNOSIS — E1151 Type 2 diabetes mellitus with diabetic peripheral angiopathy without gangrene: Secondary | ICD-10-CM | POA: Diagnosis present

## 2024-03-08 DIAGNOSIS — Z89422 Acquired absence of other left toe(s): Secondary | ICD-10-CM

## 2024-03-08 DIAGNOSIS — I2510 Atherosclerotic heart disease of native coronary artery without angina pectoris: Secondary | ICD-10-CM

## 2024-03-08 DIAGNOSIS — D75839 Thrombocytosis, unspecified: Secondary | ICD-10-CM | POA: Diagnosis present

## 2024-03-08 DIAGNOSIS — J441 Chronic obstructive pulmonary disease with (acute) exacerbation: Secondary | ICD-10-CM | POA: Diagnosis present

## 2024-03-08 DIAGNOSIS — E441 Mild protein-calorie malnutrition: Secondary | ICD-10-CM | POA: Diagnosis present

## 2024-03-08 DIAGNOSIS — J101 Influenza due to other identified influenza virus with other respiratory manifestations: Principal | ICD-10-CM | POA: Diagnosis present

## 2024-03-08 DIAGNOSIS — Z79899 Other long term (current) drug therapy: Secondary | ICD-10-CM

## 2024-03-08 DIAGNOSIS — E66811 Obesity, class 1: Secondary | ICD-10-CM | POA: Diagnosis present

## 2024-03-08 DIAGNOSIS — K921 Melena: Secondary | ICD-10-CM | POA: Diagnosis present

## 2024-03-08 DIAGNOSIS — I10 Essential (primary) hypertension: Secondary | ICD-10-CM | POA: Diagnosis present

## 2024-03-08 DIAGNOSIS — Z794 Long term (current) use of insulin: Secondary | ICD-10-CM

## 2024-03-08 DIAGNOSIS — I739 Peripheral vascular disease, unspecified: Secondary | ICD-10-CM | POA: Diagnosis present

## 2024-03-08 DIAGNOSIS — E7849 Other hyperlipidemia: Secondary | ICD-10-CM | POA: Diagnosis present

## 2024-03-08 DIAGNOSIS — Z79890 Hormone replacement therapy (postmenopausal): Secondary | ICD-10-CM

## 2024-03-08 DIAGNOSIS — Z91018 Allergy to other foods: Secondary | ICD-10-CM

## 2024-03-08 DIAGNOSIS — D638 Anemia in other chronic diseases classified elsewhere: Secondary | ICD-10-CM | POA: Diagnosis present

## 2024-03-08 DIAGNOSIS — Z8249 Family history of ischemic heart disease and other diseases of the circulatory system: Secondary | ICD-10-CM

## 2024-03-08 DIAGNOSIS — Z87891 Personal history of nicotine dependence: Secondary | ICD-10-CM

## 2024-03-08 DIAGNOSIS — E1165 Type 2 diabetes mellitus with hyperglycemia: Secondary | ICD-10-CM | POA: Diagnosis present

## 2024-03-08 DIAGNOSIS — Z7901 Long term (current) use of anticoagulants: Secondary | ICD-10-CM

## 2024-03-08 DIAGNOSIS — Z6833 Body mass index (BMI) 33.0-33.9, adult: Secondary | ICD-10-CM

## 2024-03-08 LAB — MAGNESIUM: Magnesium: 2.2 mg/dL (ref 1.7–2.4)

## 2024-03-08 LAB — COMPREHENSIVE METABOLIC PANEL WITH GFR
ALT: 23 U/L (ref 0–44)
AST: 27 U/L (ref 15–41)
Albumin: 3.4 g/dL — ABNORMAL LOW (ref 3.5–5.0)
Alkaline Phosphatase: 77 U/L (ref 38–126)
Anion gap: 10 (ref 5–15)
BUN: 9 mg/dL (ref 8–23)
CO2: 28 mmol/L (ref 22–32)
Calcium: 8.2 mg/dL — ABNORMAL LOW (ref 8.9–10.3)
Chloride: 103 mmol/L (ref 98–111)
Creatinine, Ser: 0.73 mg/dL (ref 0.44–1.00)
GFR, Estimated: >60 mL/min (ref >60)
Glucose, Bld: 176 mg/dL — ABNORMAL HIGH (ref 70–99)
Potassium: 3.3 mmol/L — ABNORMAL LOW (ref 3.5–5.1)
Sodium: 141 mmol/L (ref 135–145)
Total Bilirubin: 1 mg/dL (ref 0.0–1.2)
Total Protein: 6.8 g/dL (ref 6.5–8.1)

## 2024-03-08 LAB — CBC WITH DIFFERENTIAL/PLATELET
Abs Immature Granulocytes: 0.04 K/uL (ref 0.00–0.07)
Basophils Absolute: 0.1 K/uL (ref 0.0–0.1)
Basophils Relative: 1 %
Eosinophils Absolute: 0 K/uL (ref 0.0–0.5)
Eosinophils Relative: 0 %
HCT: 37.5 % (ref 36.0–46.0)
Hemoglobin: 10.8 g/dL — ABNORMAL LOW (ref 12.0–15.0)
Immature Granulocytes: 1 %
Lymphocytes Relative: 28 %
Lymphs Abs: 1.8 K/uL (ref 0.7–4.0)
MCH: 27.8 pg (ref 26.0–34.0)
MCHC: 28.8 g/dL — ABNORMAL LOW (ref 30.0–36.0)
MCV: 96.4 fL (ref 80.0–100.0)
Monocytes Absolute: 0.8 K/uL (ref 0.1–1.0)
Monocytes Relative: 13 %
Neutro Abs: 3.7 K/uL (ref 1.7–7.7)
Neutrophils Relative %: 57 %
Platelets: 454 K/uL — ABNORMAL HIGH (ref 150–400)
RBC: 3.89 MIL/uL (ref 3.87–5.11)
RDW: 17 % — ABNORMAL HIGH (ref 11.5–15.5)
WBC: 6.4 K/uL (ref 4.0–10.5)
nRBC: 0.5 % — ABNORMAL HIGH (ref 0.0–0.2)

## 2024-03-08 LAB — TYPE AND SCREEN
ABO/RH(D): O POS
Antibody Screen: NEGATIVE

## 2024-03-08 LAB — RESP PANEL BY RT-PCR (RSV, FLU A&B, COVID)  RVPGX2
Influenza A by PCR: POSITIVE — AB
Influenza B by PCR: NEGATIVE
Resp Syncytial Virus by PCR: NEGATIVE
SARS Coronavirus 2 by RT PCR: NEGATIVE

## 2024-03-08 LAB — HEMOGLOBIN AND HEMATOCRIT, BLOOD
HCT: 36.5 % (ref 36.0–46.0)
Hemoglobin: 10.5 g/dL — ABNORMAL LOW (ref 12.0–15.0)

## 2024-03-08 LAB — PROCALCITONIN: Procalcitonin: <0.1 ng/mL

## 2024-03-08 LAB — GLUCOSE, CAPILLARY
Glucose-Capillary: 370 mg/dL — ABNORMAL HIGH (ref 70–99)
Glucose-Capillary: 385 mg/dL — ABNORMAL HIGH (ref 70–99)
Glucose-Capillary: 425 mg/dL — ABNORMAL HIGH (ref 70–99)

## 2024-03-08 LAB — PHOSPHORUS: Phosphorus: 2.4 mg/dL — ABNORMAL LOW (ref 2.5–4.6)

## 2024-03-08 LAB — TROPONIN T, HIGH SENSITIVITY: Troponin T High Sensitivity: 60 ng/L — ABNORMAL HIGH (ref 0–19)

## 2024-03-08 LAB — PRO BRAIN NATRIURETIC PEPTIDE: Pro Brain Natriuretic Peptide: 1747 pg/mL — ABNORMAL HIGH (ref <300.0)

## 2024-03-08 MED ORDER — LEVOTHYROXINE SODIUM 125 MCG PO TABS
125.0000 ug | ORAL_TABLET | Freq: Every day | ORAL | Status: DC
  Administered 2024-03-09 – 2024-03-14 (×6): 125 ug via ORAL
  Filled 2024-03-08 (×6): qty 1

## 2024-03-08 MED ORDER — K PHOS MONO-SOD PHOS DI & MONO 155-852-130 MG PO TABS
500.0000 mg | ORAL_TABLET | Freq: Four times a day (QID) | ORAL | Status: AC
  Administered 2024-03-08 – 2024-03-09 (×3): 500 mg via ORAL
  Filled 2024-03-08 (×3): qty 2

## 2024-03-08 MED ORDER — LISINOPRIL 5 MG PO TABS
5.0000 mg | ORAL_TABLET | Freq: Every day | ORAL | Status: DC
  Filled 2024-03-08: qty 1

## 2024-03-08 MED ORDER — ACETAMINOPHEN 650 MG RE SUPP: 650.0000 mg | Freq: Four times a day (QID) | RECTAL | Status: AC | PRN

## 2024-03-08 MED ORDER — INSULIN GLARGINE 100 UNIT/ML ~~LOC~~ SOLN
20.0000 [IU] | Freq: Every day | SUBCUTANEOUS | Status: DC
  Administered 2024-03-08 – 2024-03-11 (×4): 20 [IU] via SUBCUTANEOUS
  Filled 2024-03-08 (×4): qty 0.2

## 2024-03-08 MED ORDER — APIXABAN 5 MG PO TABS: 5.0000 mg | ORAL_TABLET | Freq: Two times a day (BID) | ORAL | Status: DC

## 2024-03-08 MED ORDER — PREDNISONE 20 MG PO TABS
40.0000 mg | ORAL_TABLET | Freq: Every day | ORAL | Status: DC
  Administered 2024-03-09 – 2024-03-10 (×2): 40 mg via ORAL
  Filled 2024-03-08 (×2): qty 2

## 2024-03-08 MED ORDER — ACETAMINOPHEN 325 MG PO TABS: 650.0000 mg | ORAL_TABLET | Freq: Four times a day (QID) | ORAL | Status: AC | PRN

## 2024-03-08 MED ORDER — INSULIN ASPART 100 UNIT/ML IJ SOLN: 0.0000 [IU] | Freq: Three times a day (TID) | INTRAMUSCULAR | Status: DC

## 2024-03-08 MED ORDER — POTASSIUM CHLORIDE CRYS ER 20 MEQ PO TBCR
40.0000 meq | EXTENDED_RELEASE_TABLET | Freq: Three times a day (TID) | ORAL | Status: AC
  Administered 2024-03-08 (×2): 40 meq via ORAL
  Filled 2024-03-08 (×2): qty 2

## 2024-03-08 MED ORDER — INSULIN ASPART 100 UNIT/ML IJ SOLN
0.0000 [IU] | Freq: Three times a day (TID) | INTRAMUSCULAR | Status: DC
  Administered 2024-03-08: 19:00:00 20 [IU] via SUBCUTANEOUS
  Administered 2024-03-09: 12:00:00 15 [IU] via SUBCUTANEOUS
  Administered 2024-03-09: 10:00:00 7 [IU] via SUBCUTANEOUS
  Administered 2024-03-09: 18:00:00 15 [IU] via SUBCUTANEOUS
  Administered 2024-03-10: 09:00:00 3 [IU] via SUBCUTANEOUS
  Administered 2024-03-10: 18:00:00 11 [IU] via SUBCUTANEOUS
  Administered 2024-03-10: 13:00:00 7 [IU] via SUBCUTANEOUS
  Administered 2024-03-11: 4 [IU] via SUBCUTANEOUS
  Administered 2024-03-11: 20 [IU] via SUBCUTANEOUS
  Administered 2024-03-11: 7 [IU] via SUBCUTANEOUS
  Administered 2024-03-12: 4 [IU] via SUBCUTANEOUS
  Administered 2024-03-12: 20 [IU] via SUBCUTANEOUS
  Administered 2024-03-12: 7 [IU] via SUBCUTANEOUS
  Administered 2024-03-13: 20 [IU] via SUBCUTANEOUS
  Administered 2024-03-13: 7 [IU] via SUBCUTANEOUS
  Administered 2024-03-13 – 2024-03-14 (×3): 4 [IU] via SUBCUTANEOUS
  Filled 2024-03-08 (×2): qty 4
  Filled 2024-03-08: qty 7
  Filled 2024-03-08: qty 20
  Filled 2024-03-08: qty 15
  Filled 2024-03-08: qty 20
  Filled 2024-03-08 (×2): qty 7
  Filled 2024-03-08: qty 20
  Filled 2024-03-08: qty 3
  Filled 2024-03-08: qty 4
  Filled 2024-03-08: qty 15
  Filled 2024-03-08: qty 7
  Filled 2024-03-08: qty 20
  Filled 2024-03-08: qty 11
  Filled 2024-03-08: qty 4
  Filled 2024-03-08: qty 20
  Filled 2024-03-08: qty 7
  Filled 2024-03-08: qty 4

## 2024-03-08 MED ORDER — MAGNESIUM SULFATE 2 GM/50ML IV SOLN
2.0000 g | Freq: Once | INTRAVENOUS | Status: AC
  Administered 2024-03-08: 17:00:00 2 g via INTRAVENOUS
  Filled 2024-03-08: qty 50

## 2024-03-08 MED ORDER — POTASSIUM CHLORIDE 10 MEQ/100ML IV SOLN: 10.0000 meq | INTRAVENOUS | Status: DC

## 2024-03-08 MED ORDER — PANTOPRAZOLE SODIUM 40 MG IV SOLR
40.0000 mg | Freq: Two times a day (BID) | INTRAVENOUS | Status: DC
  Administered 2024-03-08 – 2024-03-12 (×8): 40 mg via INTRAVENOUS
  Filled 2024-03-08 (×8): qty 10

## 2024-03-08 MED ORDER — ALBUTEROL SULFATE (2.5 MG/3ML) 0.083% IN NEBU
10.0000 mg/h | INHALATION_SOLUTION | Freq: Once | RESPIRATORY_TRACT | Status: AC
  Administered 2024-03-08: 14:00:00 10 mg/h via RESPIRATORY_TRACT
  Filled 2024-03-08: qty 12

## 2024-03-08 MED ORDER — ONDANSETRON HCL 4 MG/2ML IJ SOLN
4.0000 mg | Freq: Four times a day (QID) | INTRAMUSCULAR | Status: DC | PRN
  Administered 2024-03-13: 4 mg via INTRAVENOUS
  Filled 2024-03-08: qty 2

## 2024-03-08 MED ORDER — ALBUTEROL SULFATE (2.5 MG/3ML) 0.083% IN NEBU: 2.5000 mg | INHALATION_SOLUTION | RESPIRATORY_TRACT | Status: DC | PRN

## 2024-03-08 MED ORDER — IPRATROPIUM-ALBUTEROL 0.5-2.5 (3) MG/3ML IN SOLN
3.0000 mL | Freq: Four times a day (QID) | RESPIRATORY_TRACT | Status: DC
  Administered 2024-03-08 – 2024-03-12 (×16): 3 mL via RESPIRATORY_TRACT
  Filled 2024-03-08 (×16): qty 3

## 2024-03-08 MED ORDER — PANTOPRAZOLE SODIUM 40 MG IV SOLR: 40.0000 mg | Freq: Once | INTRAVENOUS | Status: DC

## 2024-03-08 MED ORDER — POTASSIUM CHLORIDE CRYS ER 20 MEQ PO TBCR: 40.0000 meq | EXTENDED_RELEASE_TABLET | Freq: Once | ORAL | Status: DC

## 2024-03-08 MED ORDER — DAPAGLIFLOZIN PROPANEDIOL 10 MG PO TABS
10.0000 mg | ORAL_TABLET | Freq: Every day | ORAL | Status: DC
  Administered 2024-03-09 – 2024-03-14 (×6): 10 mg via ORAL
  Filled 2024-03-08 (×6): qty 1

## 2024-03-08 MED ORDER — FUROSEMIDE 10 MG/ML IJ SOLN
40.0000 mg | Freq: Once | INTRAMUSCULAR | Status: AC
  Administered 2024-03-08: 17:00:00 40 mg via INTRAVENOUS
  Filled 2024-03-08: qty 4

## 2024-03-08 MED ORDER — FUROSEMIDE 10 MG/ML IJ SOLN
20.0000 mg | Freq: Two times a day (BID) | INTRAMUSCULAR | Status: DC
  Administered 2024-03-09: 10:00:00 20 mg via INTRAVENOUS
  Filled 2024-03-08: qty 2

## 2024-03-08 MED ORDER — ONDANSETRON HCL 4 MG PO TABS: 4.0000 mg | ORAL_TABLET | Freq: Four times a day (QID) | ORAL | Status: DC | PRN

## 2024-03-08 MED ORDER — BASAGLAR KWIKPEN 100 UNIT/ML ~~LOC~~ SOPN: 20.0000 [IU] | PEN_INJECTOR | Freq: Every day | SUBCUTANEOUS | Status: DC

## 2024-03-08 NOTE — H&P (Addendum)
 History and Physical    Patient: Tammy Good FMW:[id number] DOB: [birth year] DOA: 03/08/2024 DOS: the patient was seen and examined on 03/08/2024 PCP: Nichole Senior, MD  Patient coming from: Home  Chief Complaint:  Chief Complaint  Patient presents with   Shortness of Breath   HPI: Tammy Good is a 73 y.o. female with medical history significant of CAD, history of STEMI, history of percutaneous coronary angioplasty, chronic systolic CHF, type 2 diabetes, class I obesity, osteomyelitis of the left fifth toe status post amputation, hyperlipidemia, hypothyroidism, essential hypertension, peripheral arterial disease, panhypopituitary who was recently admitted from 12/28/2023 until 12/30/2023 due to upper GIB with acute blood loss anemia who presented to the emergency department with complaints of shortness of breath associated with cough that is occasionally productive, wheezing, fatigue, headache, decreased appetite, nausea, but no emesis or diarrhea. However, she stated that her stools have been soft, dark and tarry. She denied fever or hemoptysis.  She stated that her respiratory symptoms started over a week ago.  No chest pain, palpitations, diaphoresis, PND, orthopnea or pitting edema of the lower extremities. No abdominal pain,  emesis, diarrhea, constipation, but stated she has been having melena.  However, she takes iron daily and her hemoglobin is better than 2 months ago.[name]  No flank pain, dysuria, frequency or hematuria.  No polyuria, polydipsia, polyphagia or blurred vision.   Lab work: CBC showed white count 6.4, hemoglobin 10.8 g/dL and platelets 545.  Hemoglobin is improved from previous measuring from 2 months ago when he was 8.2 g/dL.  CMP showed albumin  of 3.4 g/dL, potassium 3.3 mmol/L, glucose 176 and corrected calcium  8.8 mg/dL.  The rest of the electrolytes, the rest of the liver tests and renal function were normal.  Imaging: Portable 1 view chest radiograph showing  linear atelectasis or scarring in the left midlung.  Cardiac enlargement with pulmonary edema.   ED course: Initial vital signs were temperature 98.2 F, pulse 90, respiration 22, BP 140/61 mmHg and O2 sat 100% on nasal cannula oxygen at 5 LPM.  The patient received furosemide  60 mg IVP IVP and a continuous albuterol  10 mg neb.  She received bronchodilators and 125 mg of Solu-Medrol  with EMS.  When I entered her room in the emergency department she was off oxygen with an O2 saturation in the high 80s.  I put the cannula back on her nostrils and O2 sat improved to the low to mid 90s.  Review of Systems: As mentioned in the history of present illness. All other systems reviewed and are negative.  Past Medical History:  Diagnosis Date   CAD S/P percutaneous coronary angioplasty 08/2013   100% mRCA - PCI Integrity Resolute DES 3.0 mm x 38 mm - 3.35 mm; PTCA of RPA V 2.0 mm x 15 mm   CHF (congestive heart failure) (HCC)    Cholesteatoma    right   Diabetes mellitus type 2 in obese    On insulin  and Invokana   History of osteomyelitis L 5th Toe all 05/2019   s/p Partial Ray Amputation with partial closure; 6 wks Abx & LSFA Atherectomy/DEB PTA with Stent for focal dissection.   Hyperlipidemia with target LDL less than 70    Hypothyroidism (acquired)    Mild essential hypertension    Obesity (BMI 30-39.9) 11/17/2013   PAD (peripheral artery disease) 05/26/2019   05/26/19: Abd AoGram- BLE runoff -> L SFA orbital atherectomy - PTA w/ DEB & Stent 6 x 40 Luttonix (for focal dissection) -  patent Pop A with 3 V runoff. LEA Dopplers 01/03/2020: RABI (prev) 0.68 (0.53)/ RTBI (prev) 0.58 (0.33); LABI (prev) 0.80 (0.64), LTBI (prev) 0.64 (0.51); R mSFA ~50-74%, L mSFA 50-74%. Patent Prox SFA stent < 49% stenosis   Panhypopituitarism    Peripheral arterial disease    ST elevation myocardial infarction (STEMI) of inferior wall, subsequent episode of care (HCC) 08/2013   80% branch of D1, 40% mid AV groove  circumflex, 100% RCA with subacute thrombus -- thrombus extending into RPA V with 100% occlusion after initial angioplasty of mid RCA ;; Post MI ECHO 6/9/'15: EF 50-55%, mild LVH with moderate HK of inferior wall, Gr1 DD, mild LA dilation; mildly reduced RV function   Past Surgical History:  Procedure Laterality Date   ABDOMINAL AORTOGRAM N/A 09/10/2023   Procedure: ABDOMINAL AORTOGRAM;  Surgeon: Gretta Lonni PARAS, MD;  Location: [facility];  Service: Cardiovascular;  Laterality: N/A;   ABDOMINAL AORTOGRAM W/LOWER EXTREMITY N/A 05/26/2019   Procedure: ABDOMINAL AORTOGRAM W/LOWER EXTREMITY;  Surgeon: Gretta Lonni PARAS, MD;  Location: [facility];  Service: Cardiovascular;  Laterality: N/A;   ABDOMINAL AORTOGRAM W/LOWER EXTREMITY N/A 11/15/2020   Procedure: ABDOMINAL AORTOGRAM W/LOWER EXTREMITY;  Surgeon: Gretta Lonni PARAS, MD;  Location: [facility];  Service: Cardiovascular;  Laterality: N/A;   ABDOMINAL AORTOGRAM W/LOWER EXTREMITY N/A 04/18/2021   Procedure: ABDOMINAL AORTOGRAM W/LOWER EXTREMITY;  Surgeon: Gretta Lonni PARAS, MD;  [facility]: [facility];  Service: Cardiovascular;  Laterality: N/A;   ABDOMINAL AORTOGRAM W/LOWER EXTREMITY Left 07/11/2021   Procedure: ABDOMINAL AORTOGRAM W/LOWER EXTREMITY;  Surgeon: Gretta Lonni PARAS, MD;  [facility]: [facility];  Service: Cardiovascular;  Laterality: Left;   ABDOMINAL AORTOGRAM W/LOWER EXTREMITY N/A 11/20/2022   Procedure: ABDOMINAL AORTOGRAM W/LOWER EXTREMITY;  Surgeon: Gretta Lonni PARAS, MD;  [facility]: [facility];  Service: Cardiovascular;  Laterality: N/A;   AMPUTATION Left 05/25/2019   Procedure: AMPUTATION RAY 5th;  Surgeon: Gershon Donnice SAUNDERS, DPM;  Location: [facility];  Service: Podiatry;  Laterality: Left;   AMPUTATION Left 01/14/2022   Procedure: LEFT BELOW THE KNEE AMPUTATION;  Surgeon: Elsa Lonni SAUNDERS, MD;  Location: [facility] OR;  Service: Orthopedics;  Laterality: Left;  LENGTH OF  SURGERY: 90 MINUTES   APPLICATION OF WOUND VAC Left 01/14/2022   Procedure: WOUND VAC PLACEMENT;  Surgeon: Elsa Lonni SAUNDERS, MD;  Location: [facility] OR;  Service: Orthopedics;  Laterality: Left;   BONE BIOPSY Left 04/24/2021   Procedure: BONE BIOPSY;  Surgeon: Burt Fus, DPM;  [facility]: [facility];  Service: Podiatry;  Laterality: Left;  left ankle/fibula   BONE BIOPSY Left 07/15/2021   Procedure: LEFT FOOT BONE BIOPSY;  Surgeon: Gershon Donnice SAUNDERS, DPM;  Location: [facility];  Service: Podiatry;  Laterality: Left;   Cardiac Event Monitor  July-August 2015   Sinus rhythm with PVCs   CHOLECYSTECTOMY     COLONOSCOPY N/A 08/31/2013   Procedure: COLONOSCOPY;  Surgeon: Renaye Sous, MD;  Location: [facility] ENDOSCOPY;  Service: Endoscopy;  Laterality: N/A;   CORONARY STENT INTERVENTION N/A 11/19/2020   Procedure: CORONARY STENT INTERVENTION;  Surgeon: Mady Lonni, MD;  [facility];  Service: Cardiovascular;  Laterality: N/A;   ESOPHAGOGASTRODUODENOSCOPY N/A 09/01/2013   Procedure: ESOPHAGOGASTRODUODENOSCOPY (EGD);  Surgeon: Belvie JONETTA Just, MD;  Location: [facility] ENDOSCOPY;  Service: Endoscopy;  Laterality: N/A;  bedside   ESOPHAGOGASTRODUODENOSCOPY N/A 12/29/2023   Procedure: EGD (ESOPHAGOGASTRODUODENOSCOPY);  Surgeon: Just Belvie, MD;  Location: [facility] ENDOSCOPY;  Service: Gastroenterology;  Laterality:  N/A;  Severe anemia with melena   ESOPHAGOGASTRODUODENOSCOPY (EGD) WITH PROPOFOL  N/A 05/26/2021   Procedure: ESOPHAGOGASTRODUODENOSCOPY (EGD) WITH PROPOFOL ;  Surgeon: Rollin Dover, MD;  Location: [facility] ENDOSCOPY;  Service: Gastroenterology;  Laterality: N/A;   EYE SURGERY Bilateral    bilateral cataracts   HOT HEMOSTASIS N/A 12/29/2023   Procedure: EGD, WITH ARGON PLASMA COAGULATION;  Surgeon: Rollin Dover, MD;  Location: [facility] ENDOSCOPY;  Service: Gastroenterology;  Laterality: N/A;   INCISION AND DRAINAGE Left 04/24/2021   Procedure: INCISION AND DRAINAGE;  Surgeon: Burt Fus, DPM;  Location:  [facility];  Service: Podiatry;  Laterality: Left;   IRRIGATION AND DEBRIDEMENT FOOT Left 07/15/2021   Procedure: IRRIGATION AND DEBRIDEMENT FOOT;  Surgeon: Gershon Donnice SAUNDERS, DPM;  Location: [facility];  Service: Podiatry;  Laterality: Left;   LEFT HEART CATH AND CORONARY ANGIOGRAPHY N/A 11/19/2020   Procedure: LEFT HEART CATH AND CORONARY ANGIOGRAPHY;  Surgeon: Mady Bruckner, MD;  [facility];  Service: Cardiovascular;  Laterality: N/A;   LEFT HEART CATHETERIZATION WITH CORONARY ANGIOGRAM N/A 08/30/2013   Procedure: LEFT HEART CATHETERIZATION WITH CORONARY ANGIOGRAM;  Surgeon: Alm LELON Clay, MD;  Location: [facility] CATH LAB: 100% mRCA (thrombus - extends to RPAV), 80% D1, 40% AVG Cx.   LOWER EXTREMITY ANGIOGRAPHY N/A 09/10/2023   Procedure: Lower Extremity Angiography;  Surgeon: Gretta Bruckner PARAS, MD;  Location: [facility] INVASIVE CV LAB;  Service: Cardiovascular;  Laterality: N/A;   LOWER EXTREMITY INTERVENTION N/A 09/10/2023   Procedure: LOWER EXTREMITY INTERVENTION;  Surgeon: Gretta Bruckner PARAS, MD;  [facility];  Service: Cardiovascular;  Laterality: N/A;   PERCUTANEOUS CORONARY STENT INTERVENTION (PCI-S)  08/30/2013   Procedure: PERCUTANEOUS CORONARY STENT INTERVENTION (PCI-S);  Surgeon: Alm LELON Clay, MD;  Location: [facility] CATH LAB;  Integrity Resolute DES 2.0 mm x 38 mm -- 3.35 mm.; PTCA of proximal RPA V. - 3.0 mm x 15 mm balloon   PERIPHERAL INTRAVASCULAR LITHOTRIPSY Right 11/20/2022   Procedure: PERIPHERAL INTRAVASCULAR LITHOTRIPSY;  Surgeon: Gretta Bruckner PARAS, MD;  [facility];  Service: Cardiovascular;  Laterality: Right;  POP   PERIPHERAL VASCULAR ATHERECTOMY  05/26/2019   Procedure: PERIPHERAL VASCULAR ATHERECTOMY;  Surgeon: Gretta Bruckner PARAS, MD;  [facility];  Service: Cardiovascular;;  Left SFA   PERIPHERAL VASCULAR BALLOON ANGIOPLASTY Left 07/11/2021   Procedure: PERIPHERAL VASCULAR BALLOON ANGIOPLASTY;  Surgeon: Gretta Bruckner PARAS, MD;  [facility];  Service: Cardiovascular;  Laterality: Left;  PT TRUNK / AT   PERIPHERAL VASCULAR BALLOON ANGIOPLASTY Right 11/20/2022   Procedure: PERIPHERAL VASCULAR BALLOON ANGIOPLASTY;  Surgeon: Gretta Bruckner PARAS, MD;  [facility];  Service: Cardiovascular;  Laterality: Right;  POP   PERIPHERAL VASCULAR INTERVENTION  05/26/2019   Procedure: PERIPHERAL VASCULAR INTERVENTION;  Surgeon: Gretta Bruckner PARAS, MD;  [facility];  Service: Cardiovascular;;  Left SFA   PERIPHERAL VASCULAR INTERVENTION Right 11/15/2020   Procedure: PERIPHERAL VASCULAR INTERVENTION;  Surgeon: Gretta Bruckner PARAS, MD;  [facility];  Service: Cardiovascular;  Laterality: Right;  Superficial Femoral Artery   PERIPHERAL VASCULAR INTERVENTION Left 07/11/2021   Procedure: PERIPHERAL VASCULAR INTERVENTION;  Surgeon: Gretta Bruckner PARAS, MD;  [facility];  Service: Cardiovascular;  Laterality: Left;  SFA   PITUITARY SURGERY     TEE WITHOUT CARDIOVERSION N/A 04/26/2021   Procedure: TRANSESOPHAGEAL ECHOCARDIOGRAM (TEE);  Surgeon: Bruckner Slain, MD;  Location: [facility] ENDOSCOPY;  Service: Cardiovascular;  Laterality: N/A;  TRANSTHORACIC ECHOCARDIOGRAM  08/30/2013   mild LVH. EF 50-55%. Moderate HK of the entire inferior myocardium. GR 1 DD. Mild LA dilation. Mildly reduced RV function   TYMPANOMASTOIDECTOMY Right 12/28/2017   Procedure: RIGHT TYMPANOMASTOIDECTOMY;  Surgeon: Karis Clunes, MD;  Location: [facility];  Service: ENT;  Laterality: Right;   Social History:  reports that she has quit smoking. She has never been exposed to tobacco smoke. She has never used smokeless tobacco. She reports that she does not drink alcohol  and does not use drugs.  Allergies[1]  Family History  Problem Relation Age of Onset   Cancer Mother 61       multiple myeloma   Heart attack Father 67   Cancer Sister     Alzheimer's disease Maternal Grandmother     Prior to Admission medications  Medication Sig Start Date End Date Taking? Authorizing Provider  apixaban  (ELIQUIS ) 5 MG TABS tablet TAKE 1 TABLET BY MOUTH TWICE A DAY 03/02/24   Anner Alm ORN, MD  atorvastatin  (LIPITOR) 40 MG tablet Take 1 tablet (40 mg total) by mouth daily at 6 PM. 09/03/13   Danton, Reyes DASEN, MD  B-D UF III MINI PEN NEEDLES 31G X 5 MM MISC Inject into the skin. 05/28/20   [provider]  Continuous Blood Gluc Sensor (FREESTYLE LIBRE 2 SENSOR) MISC Apply topically every 14 (fourteen) days. 06/12/20   [provider]  cycloSPORINE  (RESTASIS ) 0.05 % ophthalmic emulsion Place 1 drop into both eyes 2 (two) times daily as needed (dry eyes).    [provider]  dapagliflozin  propanediol (FARXIGA ) 10 MG TABS tablet Take 10 mg by mouth daily.    [provider]  dexamethasone  (DECADRON ) 0.5 MG tablet Take 0.5 mg by mouth daily. 08/21/23   [provider]  diclofenac  Sodium (VOLTAREN ) 1 % GEL Apply 4 g topically 4 (four) times daily. Patient taking differently: Apply 4 g topically 4 (four) times daily as needed (knee pain). 01/30/22   Love, Sharlet RAMAN, PA-C  ferrous sulfate  325 (65 FE) MG tablet Take 325 mg by mouth at bedtime.    [provider]  insulin  aspart (NOVOLOG  FLEXPEN) 100 UNIT/ML FlexPen Inject 2-10 Units into the skin 3 (three) times daily with meals. Sliding scale insulin     [provider]  Insulin  Glargine (BASAGLAR  KWIKPEN) 100 UNIT/ML Inject 24 Units into the skin at bedtime. Patient taking differently: Inject 42 Units into the skin at bedtime. 01/30/22   Love, Sharlet RAMAN, PA-C  levothyroxine  (SYNTHROID ) 125 MCG tablet Take 1 tablet (125 mcg total) by mouth daily before breakfast. 11/21/20   Baglia, Corrina, PA-C  midodrine  (PROAMATINE ) 5 MG tablet Take 1 tablet (5 mg total) by mouth 2 (two) times daily with breakfast and lunch. Patient taking differently: Take 5 mg  by mouth 2 (two) times daily with a meal. Lunch and Dinner 01/31/22   Love, Sharlet RAMAN, PA-C  nitroGLYCERIN  (NITROSTAT ) 0.4 MG SL tablet Place 0.4 mg under the tongue every 5 (five) minutes as needed for chest pain. 08/23/21   [provider]  pantoprazole  (PROTONIX ) 40 MG tablet Take 1 tablet (40 mg total) by mouth 2 (two) times daily. 12/30/23   Ghimire, Donalda HERO, MD  polyethylene glycol powder (GLYCOLAX /MIRALAX ) 17 GM/SCOOP powder Take 1 capful with water (17 g) by mouth daily. 06/04/21   Rai, Nydia POUR, MD  pregabalin (LYRICA) 50 MG capsule Take 50 mg by mouth daily. 10/29/23   [provider]    Physical Exam:  Vitals:   03/08/24 1325 03/08/24 1330 03/08/24 1338  BP: (!) 140/61    Pulse: 90    Resp: (!) 22    Temp: 98.2 F (36.8 C)    TempSrc: Oral    SpO2: 100% 96%   Weight:   95.7 kg   Physical Exam Vitals and nursing note reviewed.  Constitutional:      General: She is awake. She is not in acute distress.    Appearance: She is obese. She is ill-appearing.     Interventions: Nasal cannula in place.  HENT:     Head: Normocephalic.     Nose: No rhinorrhea.     Mouth/Throat:     Mouth: Mucous membranes are dry.  Eyes:     General: No scleral icterus.    Pupils: Pupils are equal, round, and reactive to light.  Neck:     Vascular: No JVD.  Cardiovascular:     Rate and Rhythm: Normal rate and regular rhythm.     Heart sounds: S1 normal and S2 normal.  Pulmonary:     Effort: No accessory muscle usage or respiratory distress.     Breath sounds: Decreased breath sounds, wheezing and rhonchi present. No rales.  Abdominal:     General: Bowel sounds are normal. There is no distension.     Palpations: Abdomen is soft.     Tenderness: There is no abdominal tenderness. There is no right CVA tenderness or left CVA tenderness.  Musculoskeletal:     Cervical back: Neck supple.     Right lower leg: No edema.     Left lower leg: No edema.  Skin:    General: Skin is  warm and dry.  Neurological:     General: No focal deficit present.     Mental Status: She is alert and oriented to person, place, and time.  Psychiatric:        Mood and Affect: Mood normal.        Behavior: Behavior normal. Behavior is cooperative.     Data Reviewed:  Results are pending, will review when available. 04/26/2021 TEE report. IMPRESSIONS:   1. Left ventricular ejection fraction, by estimation, is 40 to 45%. The  left ventricle has mildly decreased function. The left ventricle  demonstrates global hypokinesis.   2. Right ventricular systolic function is normal. The right ventricular  size is normal.   3. Left atrial size was mildly dilated. No left atrial/left atrial  appendage thrombus was detected.   4. The mitral valve is normal in structure. Trivial mitral valve  regurgitation. No evidence of mitral stenosis.   5. The aortic valve is tricuspid. Aortic valve regurgitation is not  visualized. No aortic stenosis is present.   6. There is mild (Grade II) plaque involving the descending aorta.   Conclusion(s)/Recommendation(s): No evidence of vegetation/infective  endocarditis on this transesophageael echocardiogram.   EKG: Vent. rate 79 BPM  PR interval 162 ms  QRS duration 114 ms  QT/QTcB 425/488 ms  P-R-T axes 55 -78 2  Sinus rhythm Incomplete right bundle branch block Inferior infarct, old Probable anterior infarct, age indeterminate  Assessment and Plan: Principal Problem:   COPD with acute exacerbation (HCC)  In the setting of:   Influenza A With superimposed:   Acute on chronic combined systolic and diastolic heart failure (HCC) Observation/telemetry. Continue supplemental oxygen. Methylprednisolone  125 mg IVP x1 already given. Followed by prednisone  40 mg p.o. daily in a.m. Scheduled and as needed bronchodilators. Already off the window  for oseltamivir  treatment. Sodium and fluid restriction. Continue furosemide  20 mg IVP twice  daily. Begin lisinopril  2.5 mg p.o. daily. Monitor daily weights, intake and output. Monitor renal function electrolytes. Check transthoracic echocardiogram. Cardiology will evaluate.  Active Problems:   Melena  Clear liquid diet. Pantoprazole  40 mg IVP x 2. Check stool occult blood. Monitor hematocrit hemoglobin.    Essential hypertension On furosemide  as above. Started lisinopril  5 mg p.o. daily.  Follow-up    Hypothyroidism Continue levothyroxine  125 mcg p.o. daily.    Normocytic anemia Monitor hematocrit and hemoglobin.    CAD S/P percutaneous coronary angioplasty   PAD (peripheral artery disease) Continue atorvastatin  and nitroglycerin  as needed. She has been off Plavix  and apixaban  after UGI bleed.    Paroxysmal atrial fibrillation (HCC) CHA?DS?-VASc Score of at least 6. Off anticoagulation.    Diabetes mellitus, type 2 (HCC) Carbohydrate modified diet. Continue Lantus  20 units SQ at bedtime. CBG monitoring with RI SS. Check hemoglobin A1c.    Hyperlipidemia associated with type 2 diabetes mellitus (HCC) Continue atorvastatin .    Hypophosphatemia Replacing.    Hypokalemia Supplement given. Follow-up K level in a.m.    Thrombocytosis In the setting of anemia. Monitor platelet count.    Mild protein malnutrition In the setting of anemia and chronic illness. May benefit from protein supplementation. Consider nutritional services evaluation. Follow-up albumin  level in AM.    Advance Care Planning:   Code Status: Full Code   Consults:   Family Communication:   Severity of Illness: The appropriate patient status for this patient is OBSERVATION. Observation status is judged to be reasonable and necessary in order to provide the required intensity of service to ensure the patient's safety. The patient's presenting symptoms, physical exam findings, and initial radiographic and laboratory data in the context of their medical condition is felt to place  them at decreased risk for further clinical deterioration. Furthermore, it is anticipated that the patient will be medically stable for discharge from the hospital within 2 midnights of admission.   Author: Alm Dorn Castor, MD 03/08/2024 3:50 PM  For on call review [email or web address].   This document was prepared using Dragon voice recognition software and may contain some unintended transcription errors.     [1]  Allergies Allergen Reactions   Strawberry Extract Anaphylaxis, Itching and Swelling    Mouth swells and gets itchy

## 2024-03-08 NOTE — ED Provider Notes (Signed)
 [place] EMERGENCY DEPARTMENT AT [facility] Provider Note   CSN: [id number] Arrival date & time: 03/08/24  1319     History {Add pertinent medical, surgical, social history, OB history to HPI:1} Chief Complaint  Patient presents with   Shortness of Breath    Tammy Good is a 73 y.o. female with CAD s/p PCI 2015, PAD-numerous PCI's-left BKA, chronic HFpEF, DM-2, HLD, panhypopituitarism who presents via [facility] from home for sob and wheezing. Given 2 duonebs and 125mg  solumedrol by EMS.  VSS: 154/90, HR 70, RR24, 96% RA. CBG 200. Reports some increased wob, productive cough. Onset 1-2 weeks ago ago. Gradually worse. Denies CP. S/p L AKA, denies swelling in R leg. Doesn't wear O2 at home. She also reports black tarry stools x 2 days. Takes eliquis /plavix .   Per chart review, was admitted from 12/28/23-12/30/23 w/ UGIB w/ acute blood loss anemia, Hgb nadir 5.6, 2/2 AVMs seen on EGD on 10/7 s/p APC. Received 2 U PRBCs.    Past Medical History:  Diagnosis Date   CAD S/P percutaneous coronary angioplasty 08/2013   100% mRCA - PCI Integrity Resolute DES 3.0 mm x 38 mm - 3.35 mm; PTCA of RPA V 2.0 mm x 15 mm   CHF (congestive heart failure) (HCC)    Cholesteatoma    right   Diabetes mellitus type 2 in obese    On insulin  and Invokana   History of osteomyelitis L 5th Toe all 05/2019   s/p Partial Ray Amputation with partial closure; 6 wks Abx & LSFA Atherectomy/DEB PTA with Stent for focal dissection.   Hyperlipidemia with target LDL less than 70    Hypothyroidism (acquired)    Mild essential hypertension    Obesity (BMI 30-39.9) 11/17/2013   PAD (peripheral artery disease) 05/26/2019   05/26/19: Abd AoGram- BLE runoff -> L SFA orbital atherectomy - PTA w/ DEB & Stent 6 x 40 Luttonix (for focal dissection) - patent Pop A with 3 V runoff. LEA Dopplers 01/03/2020: RABI (prev) 0.68 (0.53)/ RTBI (prev) 0.58 (0.33); LABI (prev) 0.80 (0.64), LTBI (prev) 0.64 (0.51); R  mSFA ~50-74%, L mSFA 50-74%. Patent Prox SFA stent < 49% stenosis   Panhypopituitarism    Peripheral arterial disease    ST elevation myocardial infarction (STEMI) of inferior wall, subsequent episode of care (HCC) 08/2013   80% branch of D1, 40% mid AV groove circumflex, 100% RCA with subacute thrombus -- thrombus extending into RPA V with 100% occlusion after initial angioplasty of mid RCA ;; Post MI ECHO 6/9/'15: EF 50-55%, mild LVH with moderate HK of inferior wall, Gr1 DD, mild LA dilation; mildly reduced RV function       Home Medications Prior to Admission medications  Medication Sig Start Date End Date Taking? Authorizing Provider  apixaban  (ELIQUIS ) 5 MG TABS tablet TAKE 1 TABLET BY MOUTH TWICE A DAY 03/02/24   Anner Alm ORN, MD  atorvastatin  (LIPITOR) 40 MG tablet Take 1 tablet (40 mg total) by mouth daily at 6 PM. 09/03/13   Danton, Reyes DASEN, MD  B-D UF III MINI PEN NEEDLES 31G X 5 MM MISC Inject into the skin. 05/28/20   [provider]  Continuous Blood Gluc Sensor (FREESTYLE LIBRE 2 SENSOR) MISC Apply topically every 14 (fourteen) days. 06/12/20   [provider]  cycloSPORINE  (RESTASIS ) 0.05 % ophthalmic emulsion Place 1 drop into both eyes 2 (two) times daily as needed (dry eyes).    [provider]  dapagliflozin  propanediol (FARXIGA ) 10  MG TABS tablet Take 10 mg by mouth daily.    [provider]  dexamethasone  (DECADRON ) 0.5 MG tablet Take 0.5 mg by mouth daily. 08/21/23   [provider]  diclofenac  Sodium (VOLTAREN ) 1 % GEL Apply 4 g topically 4 (four) times daily. Patient taking differently: Apply 4 g topically 4 (four) times daily as needed (knee pain). 01/30/22   Love, Sharlet RAMAN, PA-C  ferrous sulfate  325 (65 FE) MG tablet Take 325 mg by mouth at bedtime.    [provider]  insulin  aspart (NOVOLOG  FLEXPEN) 100 UNIT/ML FlexPen Inject 2-10 Units into the skin 3 (three) times daily with meals. Sliding scale insulin      [provider]  Insulin  Glargine (BASAGLAR  KWIKPEN) 100 UNIT/ML Inject 24 Units into the skin at bedtime. Patient taking differently: Inject 42 Units into the skin at bedtime. 01/30/22   Love, Sharlet RAMAN, PA-C  levothyroxine  (SYNTHROID ) 125 MCG tablet Take 1 tablet (125 mcg total) by mouth daily before breakfast. 11/21/20   Baglia, Corrina, PA-C  midodrine  (PROAMATINE ) 5 MG tablet Take 1 tablet (5 mg total) by mouth 2 (two) times daily with breakfast and lunch. Patient taking differently: Take 5 mg by mouth 2 (two) times daily with a meal. Lunch and Dinner 01/31/22   Love, Sharlet RAMAN, PA-C  nitroGLYCERIN  (NITROSTAT ) 0.4 MG SL tablet Place 0.4 mg under the tongue every 5 (five) minutes as needed for chest pain. 08/23/21   [provider]  pantoprazole  (PROTONIX ) 40 MG tablet Take 1 tablet (40 mg total) by mouth 2 (two) times daily. 12/30/23   Ghimire, Donalda HERO, MD  polyethylene glycol powder (GLYCOLAX /MIRALAX ) 17 GM/SCOOP powder Take 1 capful with water (17 g) by mouth daily. 06/04/21   Rai, Nydia POUR, MD  pregabalin (LYRICA) 50 MG capsule Take 50 mg by mouth daily. 10/29/23   [provider]      Allergies    Strawberry extract    Review of Systems   Review of Systems A 10 point review of systems was performed and is negative unless otherwise reported in HPI.  Physical Exam Updated Vital Signs BP (!) 140/61 (BP Location: Right Arm)   Pulse 90   Temp 98.2 F (36.8 C) (Oral)   Resp (!) 22   Wt 95.7 kg   SpO2 96%   BMI 33.05 kg/m  Physical Exam General: Normal appearing female, lying in bed.  HEENT: PERRLA, Sclera anicteric, MMM, trachea midline.  Cardiology: RRR, no murmurs/rubs/gallops. BL radial and DP pulses equal bilaterally.  Resp:*** Normal respiratory rate and effort. CTAB, no wheezes, rhonchi, crackles.  Abd: Soft, non-tender, non-distended. No rebound tenderness or guarding.  DRE: *** MSK: No peripheral edema or signs of trauma. Extremities without  deformity or TTP. No cyanosis or clubbing. Skin: warm, dry. No rashes or lesions. Back: No CVA tenderness Neuro: A&Ox4, CNs II-XII grossly intact. MAEs. Sensation grossly intact.  Psych: Normal mood and affect.   ED Results / Procedures / Treatments   Labs (all labs ordered are listed, but only abnormal results are displayed) Labs Reviewed  COMPREHENSIVE METABOLIC PANEL WITH GFR - Abnormal; Notable for the following components:      Result Value   Potassium 3.3 (*)    Glucose, Bld 176 (*)    Calcium  8.2 (*)    Albumin  3.4 (*)    All other components within normal limits  CBC WITH DIFFERENTIAL/PLATELET - Abnormal; Notable for the following components:   Hemoglobin 10.8 (*)    MCHC 28.8 (*)  RDW 17.0 (*)    Platelets 454 (*)    nRBC 0.5 (*)    All other components within normal limits  RESP PANEL BY RT-PCR (RSV, FLU A&B, COVID)  RVPGX2  PRO BRAIN NATRIURETIC PEPTIDE  POC OCCULT BLOOD, ED  TYPE AND SCREEN  TROPONIN T, HIGH SENSITIVITY    EKG EKG Interpretation Date/Time:  Tuesday March 08 2024 13:39:37 EST Ventricular Rate:  79 PR Interval:  162 QRS Duration:  114 QT Interval:  425 QTC Calculation: 488 R Axis:   -78  Text Interpretation: Sinus rhythm Incomplete right bundle branch block Inferior infarct, old Probable anterior infarct, age indeterminate Similar to prior Confirmed by Franklyn Gills [phone]) on 03/08/2024 2:00:58 PM  Radiology DG Chest Port 1 View Result Date: 03/08/2024 EXAM: 1 VIEW(S) XRAY OF THE CHEST 03/08/2024 02:19:00 PM COMPARISON: Compared with 12/28/2023. CLINICAL HISTORY: SOB (shortness of breath). Postoperative history: Wheezing. FINDINGS: LUNGS AND PLEURA: Shallow inspiration. Linear atelectasis or scarring in the left midlung. Right lung is clear. No vascular congestion or edema. No pleural effusion. No pneumothorax. HEART AND MEDIASTINUM: Cardiac enlargement. Calcification of the aorta. Mediastinal contours appear intact. BONES AND SOFT  TISSUES: No acute osseous abnormality. IMPRESSION: 1. Linear atelectasis or scarring in the left midlung. 2. Cardiac enlargement without pulmonary edema. Electronically signed by: Elsie Gravely MD 03/08/2024 02:24 PM EST RP Workstation: [id number]    Procedures Procedures  {Document cardiac monitor, telemetry assessment procedure when appropriate:1}  Medications Ordered in ED Medications  albuterol  (PROVENTIL ) (2.5 MG/3ML) 0.083% nebulizer solution (10 mg/hr Nebulization Given 03/08/24 1421)    ED Course/ Medical Decision Making/ A&P                          Medical Decision Making Amount and/or Complexity of Data Reviewed Labs: ordered.    This patient presents to the ED for concern of ***, this involves an extensive number of treatment options, and is a complaint that carries with it a high risk of complications and morbidity.  I considered the following differential and admission for this acute, potentially life threatening condition.   MDM:    DDX for dyspnea includes but is not limited to:  Cardiac- CHF, Myocardial Ischemia, Valvular heart disease, Arrythmia, Cardiac tamponade   Respiratory - Pneumonia / atelectasis / pulmonary effusion / cavitary lung disease, Pneumothorax, COPD/ reactive airway disease, PE, ARDS   Other - Sepsis, Anemia   RT saw and evaluated patient, placed on continuous albuterol . When I arrived in room, albuterol  running on room air, and patient's O2 sat at 82%. Placed on 4L [place] with continuous albuterol  and O2 improved to 96098%.   Clinical Course as of 03/08/24 1534  Tue Mar 08, 2024  1449 WBC: 6.4 No leukocytosis  [HN]  1450 Hemoglobin(!): 10.8 Increased from 8-9 two months ago [HN]  1450 DG Chest Port 1 View 1. Linear atelectasis or scarring in the left midlung. 2. Cardiac enlargement without pulmonary edema. [HN]  1509 Pro Brain Natriuretic Peptide(!): 1,747.0 Will give lasix  40 mg IV and potassium supplementation IV as well [HN]  1534  Influenza A By PCR(!): POSITIVE +Flu A [HN]    Clinical Course User Index [HN] Franklyn Gills SAILOR, MD    Labs: I Ordered, and personally interpreted labs.  The pertinent results include:  those listed above  Imaging Studies ordered: I ordered imaging studies including CXR I independently visualized and interpreted imaging. I agree with the radiologist interpretation  Additional history obtained from  chart review, EMS.    Cardiac Monitoring: The patient was maintained on a cardiac monitor.  I personally viewed and interpreted the cardiac monitored which showed an underlying rhythm of: ***  Reevaluation: After the interventions noted above, I reevaluated the patient and found that they have :{resolved/improved/worsened:23923::improved}  Social Determinants of Health: Lives independently  Disposition:  ***  Co morbidities that complicate the patient evaluation  Past Medical History:  Diagnosis Date   CAD S/P percutaneous coronary angioplasty 08/2013   100% mRCA - PCI Integrity Resolute DES 3.0 mm x 38 mm - 3.35 mm; PTCA of RPA V 2.0 mm x 15 mm   CHF (congestive heart failure) (HCC)    Cholesteatoma    right   Diabetes mellitus type 2 in obese    On insulin  and Invokana   History of osteomyelitis L 5th Toe all 05/2019   s/p Partial Ray Amputation with partial closure; 6 wks Abx & LSFA Atherectomy/DEB PTA with Stent for focal dissection.   Hyperlipidemia with target LDL less than 70    Hypothyroidism (acquired)    Mild essential hypertension    Obesity (BMI 30-39.9) 11/17/2013   PAD (peripheral artery disease) 05/26/2019   05/26/19: Abd AoGram- BLE runoff -> L SFA orbital atherectomy - PTA w/ DEB & Stent 6 x 40 Luttonix (for focal dissection) - patent Pop A with 3 V runoff. LEA Dopplers 01/03/2020: RABI (prev) 0.68 (0.53)/ RTBI (prev) 0.58 (0.33); LABI (prev) 0.80 (0.64), LTBI (prev) 0.64 (0.51); R mSFA ~50-74%, L mSFA 50-74%. Patent Prox SFA stent < 49% stenosis    Panhypopituitarism    Peripheral arterial disease    ST elevation myocardial infarction (STEMI) of inferior wall, subsequent episode of care (HCC) 08/2013   80% branch of D1, 40% mid AV groove circumflex, 100% RCA with subacute thrombus -- thrombus extending into RPA V with 100% occlusion after initial angioplasty of mid RCA ;; Post MI ECHO 6/9/'15: EF 50-55%, mild LVH with moderate HK of inferior wall, Gr1 DD, mild LA dilation; mildly reduced RV function     Medicines Meds ordered this encounter  Medications   albuterol  (PROVENTIL ) (2.5 MG/3ML) 0.083% nebulizer solution    I have reviewed the patients home medicines and have made adjustments as needed  Problem List / ED Course: Problem List Items Addressed This Visit   None        {Document critical care time when appropriate:1} {Document review of labs and clinical decision tools ie heart score, Chads2Vasc2 etc:1}  {Document your independent review of radiology images, and any outside records:1} {Document your discussion with family members, caretakers, and with consultants:1} {Document social determinants of health affecting pt's care:1} {Document your decision making why or why not admission, treatments were needed:1}  This note was created using dictation software, which may contain spelling or grammatical errors.

## 2024-03-08 NOTE — ED Triage Notes (Addendum)
 BIB [facility] from home for sob and wheezing. LS with rhonchi and wheezing bilaterally. Given 2 duonebs and 125mg  solumedrol. NSL 20g L FA. VSS: 154/90, HR 70, RR24, 96% RA. CBG 200. Alert, NAD, calm, interactive, some increased wob, productive cough. Onset 1 week ago. Gradually worse. Denies pain. Neb in progress. EDPA into see.

## 2024-03-09 ENCOUNTER — Observation Stay (HOSPITAL_COMMUNITY)

## 2024-03-09 DIAGNOSIS — E039 Hypothyroidism, unspecified: Secondary | ICD-10-CM | POA: Diagnosis present

## 2024-03-09 DIAGNOSIS — J208 Acute bronchitis due to other specified organisms: Secondary | ICD-10-CM | POA: Diagnosis present

## 2024-03-09 DIAGNOSIS — D638 Anemia in other chronic diseases classified elsewhere: Secondary | ICD-10-CM | POA: Diagnosis present

## 2024-03-09 DIAGNOSIS — E1165 Type 2 diabetes mellitus with hyperglycemia: Secondary | ICD-10-CM | POA: Diagnosis present

## 2024-03-09 DIAGNOSIS — E876 Hypokalemia: Secondary | ICD-10-CM | POA: Diagnosis present

## 2024-03-09 DIAGNOSIS — Z7901 Long term (current) use of anticoagulants: Secondary | ICD-10-CM | POA: Diagnosis not present

## 2024-03-09 DIAGNOSIS — I5043 Acute on chronic combined systolic (congestive) and diastolic (congestive) heart failure: Secondary | ICD-10-CM

## 2024-03-09 DIAGNOSIS — D75839 Thrombocytosis, unspecified: Secondary | ICD-10-CM | POA: Diagnosis present

## 2024-03-09 DIAGNOSIS — J101 Influenza due to other identified influenza virus with other respiratory manifestations: Secondary | ICD-10-CM | POA: Diagnosis present

## 2024-03-09 DIAGNOSIS — E7849 Other hyperlipidemia: Secondary | ICD-10-CM | POA: Diagnosis present

## 2024-03-09 DIAGNOSIS — R609 Edema, unspecified: Secondary | ICD-10-CM | POA: Diagnosis not present

## 2024-03-09 DIAGNOSIS — K921 Melena: Secondary | ICD-10-CM | POA: Diagnosis present

## 2024-03-09 DIAGNOSIS — Z89512 Acquired absence of left leg below knee: Secondary | ICD-10-CM | POA: Diagnosis not present

## 2024-03-09 DIAGNOSIS — E441 Mild protein-calorie malnutrition: Secondary | ICD-10-CM | POA: Diagnosis present

## 2024-03-09 DIAGNOSIS — E66811 Obesity, class 1: Secondary | ICD-10-CM | POA: Diagnosis present

## 2024-03-09 DIAGNOSIS — E230 Hypopituitarism: Secondary | ICD-10-CM | POA: Diagnosis present

## 2024-03-09 DIAGNOSIS — E1151 Type 2 diabetes mellitus with diabetic peripheral angiopathy without gangrene: Secondary | ICD-10-CM | POA: Diagnosis present

## 2024-03-09 DIAGNOSIS — J441 Chronic obstructive pulmonary disease with (acute) exacerbation: Secondary | ICD-10-CM | POA: Diagnosis present

## 2024-03-09 DIAGNOSIS — I110 Hypertensive heart disease with heart failure: Secondary | ICD-10-CM | POA: Diagnosis present

## 2024-03-09 DIAGNOSIS — J440 Chronic obstructive pulmonary disease with acute lower respiratory infection: Secondary | ICD-10-CM | POA: Diagnosis present

## 2024-03-09 DIAGNOSIS — E1169 Type 2 diabetes mellitus with other specified complication: Secondary | ICD-10-CM | POA: Diagnosis present

## 2024-03-09 DIAGNOSIS — J9601 Acute respiratory failure with hypoxia: Secondary | ICD-10-CM | POA: Diagnosis present

## 2024-03-09 DIAGNOSIS — I480 Paroxysmal atrial fibrillation: Secondary | ICD-10-CM | POA: Diagnosis present

## 2024-03-09 DIAGNOSIS — Z794 Long term (current) use of insulin: Secondary | ICD-10-CM | POA: Diagnosis not present

## 2024-03-09 LAB — COMPREHENSIVE METABOLIC PANEL WITH GFR
ALT: 19 U/L (ref 0–44)
AST: 14 U/L — ABNORMAL LOW (ref 15–41)
Albumin: 3.5 g/dL (ref 3.5–5.0)
Alkaline Phosphatase: 76 U/L (ref 38–126)
Anion gap: 10 (ref 5–15)
BUN: 15 mg/dL (ref 8–23)
CO2: 28 mmol/L (ref 22–32)
Calcium: 8.7 mg/dL — ABNORMAL LOW (ref 8.9–10.3)
Chloride: 102 mmol/L (ref 98–111)
Creatinine, Ser: 0.87 mg/dL (ref 0.44–1.00)
GFR, Estimated: >60 mL/min (ref >60)
Glucose, Bld: 278 mg/dL — ABNORMAL HIGH (ref 70–99)
Potassium: 5.1 mmol/L (ref 3.5–5.1)
Sodium: 140 mmol/L (ref 135–145)
Total Bilirubin: 0.9 mg/dL (ref 0.0–1.2)
Total Protein: 6.9 g/dL (ref 6.5–8.1)

## 2024-03-09 LAB — GLUCOSE, CAPILLARY
Glucose-Capillary: 223 mg/dL — ABNORMAL HIGH (ref 70–99)
Glucose-Capillary: 250 mg/dL — ABNORMAL HIGH (ref 70–99)
Glucose-Capillary: 262 mg/dL — ABNORMAL HIGH (ref 70–99)
Glucose-Capillary: 298 mg/dL — ABNORMAL HIGH (ref 70–99)
Glucose-Capillary: 302 mg/dL — ABNORMAL HIGH (ref 70–99)
Glucose-Capillary: 328 mg/dL — ABNORMAL HIGH (ref 70–99)
Glucose-Capillary: 333 mg/dL — ABNORMAL HIGH (ref 70–99)

## 2024-03-09 LAB — CBC
HCT: 34 % — ABNORMAL LOW (ref 36.0–46.0)
Hemoglobin: 9.8 g/dL — ABNORMAL LOW (ref 12.0–15.0)
MCH: 27.4 pg (ref 26.0–34.0)
MCHC: 28.8 g/dL — ABNORMAL LOW (ref 30.0–36.0)
MCV: 95 fL (ref 80.0–100.0)
Platelets: 495 K/uL — ABNORMAL HIGH (ref 150–400)
RBC: 3.58 MIL/uL — ABNORMAL LOW (ref 3.87–5.11)
RDW: 17.5 % — ABNORMAL HIGH (ref 11.5–15.5)
WBC: 9.9 K/uL (ref 4.0–10.5)
nRBC: 0 % (ref 0.0–0.2)

## 2024-03-09 LAB — ECHOCARDIOGRAM COMPLETE
Area-P 1/2: 3.28 cm2
S' Lateral: 2.6 cm
Weight: 3389.79 [oz_av]

## 2024-03-09 MED ORDER — BUDESONIDE 0.25 MG/2ML IN SUSP
0.2500 mg | Freq: Two times a day (BID) | RESPIRATORY_TRACT | Status: DC
  Administered 2024-03-09 – 2024-03-14 (×11): 0.25 mg via RESPIRATORY_TRACT
  Filled 2024-03-09 (×11): qty 2

## 2024-03-09 MED ORDER — OSELTAMIVIR PHOSPHATE 75 MG PO CAPS
75.0000 mg | ORAL_CAPSULE | Freq: Two times a day (BID) | ORAL | Status: AC
  Administered 2024-03-09 – 2024-03-13 (×10): 75 mg via ORAL
  Filled 2024-03-09 (×10): qty 1

## 2024-03-09 MED ORDER — PERFLUTREN LIPID MICROSPHERE
1.0000 mL | INTRAVENOUS | Status: AC | PRN
  Administered 2024-03-09: 08:00:00 3 mL via INTRAVENOUS

## 2024-03-09 MED ORDER — INSULIN ASPART 100 UNIT/ML IJ SOLN
3.0000 [IU] | Freq: Three times a day (TID) | INTRAMUSCULAR | Status: DC
  Administered 2024-03-09 – 2024-03-11 (×6): 3 [IU] via SUBCUTANEOUS
  Filled 2024-03-09 (×6): qty 3

## 2024-03-09 MED ORDER — POLYETHYLENE GLYCOL 3350 17 G PO PACK
17.0000 g | PACK | Freq: Every day | ORAL | Status: DC
  Administered 2024-03-10: 09:00:00 17 g via ORAL
  Filled 2024-03-09 (×2): qty 1

## 2024-03-09 MED ORDER — FUROSEMIDE 10 MG/ML IJ SOLN
20.0000 mg | Freq: Every day | INTRAMUSCULAR | Status: DC
  Administered 2024-03-10: 09:00:00 20 mg via INTRAVENOUS
  Filled 2024-03-09: qty 2

## 2024-03-09 NOTE — Progress Notes (Signed)
 Bilateral lower extremity venous duplex has been completed. Preliminary results can be found in CV Proc through chart review.   03/09/2024 9:23 AM Cathlyn Collet RVT

## 2024-03-09 NOTE — Progress Notes (Signed)
 PROGRESS NOTE    Tammy Good  FMW:[id number] DOB: [birth year] DOA: 03/08/2024 PCP: Tammy Senior, MD   Brief Narrative: 73 year old with past medical history significant for CAD, history of STEMI, history of percutaneous coronary angioplasty, chronic systolic heart failure, type 2 diabetes, hypothyroidism, essential hypertension, peripheral artery disease, panhypopituitary is small recently admitted from 10/6 until 12/30/18/2025 due to upper GI bleed, acute blood loss anemia presented to the ED complaining of shortness of breath, cough, wheezing fatigue headache decreased appetite nausea but no vomiting. She reports her stool has been soft dark and tarry.  Respiratory symptoms started over a week ago.  She continued to take iron supplement  Evaluation in the ED hemoglobin 10 potassium 3.3, chest x-ray linear atelectasis, flu Enza A+  Assessment & Plan:   Principal Problem:   Influenza A Active Problems:   Essential hypertension   Hypothyroidism   Normocytic anemia   CAD S/P percutaneous coronary angioplasty   Diabetes mellitus, type 2 (HCC)   Paroxysmal atrial fibrillation (HCC)   PAD (peripheral artery disease)   Hyperlipidemia associated with type 2 diabetes mellitus (HCC)   Hypokalemia   Thrombocytosis   Mild protein malnutrition   Acute on chronic combined systolic and diastolic heart failure (HCC)   COPD with acute exacerbation (HCC)   Hypophosphatemia   Melena  1-Influenza A+ COPD exacerbation ? [name] She report she doesn't have a history of COPD - Patient presents with fever, cough, shortness of breath and to have influenza A+ - Continue prednisone  Will start Tamiflu  Continue IV Lasix  daily   Acute on chronic combined systolic and diastolic heart failure - Continue with IV Lasix  daily - Follow-up 2D echo - Hold lisinopril  to avoid hypotension  Melena -Recent admission for GI secondary to bleeding AVM, treated with APC.  -Continue PPI -Hold ferrous sulfate .   -Monitor Hb Holding Eliquis .   History of Left BKA:  Supportive care  Essential hypertension - Hold lisinopril .  Continue Lasix   Hypothyroidism - Continue Synthroid   Normocytic anemia - Monitor hemoglobin due to concern for melena  CAD status post percutaneous coronary angioplasty PAD - Continue with statin.  She has been off of Plavix  due to GI bleed  Paroxysmal A-fib - Holding anticoagulation in the setting of melena  Diabetes type 2: - Continue Lantus .  With our meal coverage  Hyperlipidemia - Continue atorvastatin   Hypophosphatemia - Replace  Hypokalemia - Replace  Thrombocytosis - In setting of infectious.  Follow  Mild protein caloric malnutrition: - In  setting of acute illness   Estimated body mass index is 33.18 kg/m as calculated from the following:   Height as of 02/10/24: 5' 7 (1.702 m).   Weight as of this encounter: 96.1 kg.   DVT prophylaxis: Holding anticoagulation Code Status: Full code Family Communication: Care discussed with patient Disposition Plan:  Status is: Observation The patient remains OBS appropriate and will d/c before 2 midnights.    Consultants:  None  Procedures:  Echo  Antimicrobials:    Subjective: She is feeling somewhat better today, reports she is still coughing.  Reports shortness of breath.  She denies any prior history of COPD.  She smoked more than 20 years ago Objective: Vitals:   03/08/24 1830 03/08/24 2352 03/09/24 0500 03/09/24 0526  BP: (!) 124/56 117/69  127/63  Pulse: (!) 101 84  85  Resp: 20 18  17   Temp: 99.2 F (37.3 C) 98.7 F (37.1 C)  98 F (36.7 C)  TempSrc: Oral  SpO2: 92% 94%  98%  Weight:   96.1 kg     Intake/Output Summary (Last 24 hours) at 03/09/2024 0758 Last data filed at 03/09/2024 0528 Gross per 24 hour  Intake 650 ml  Output 1200 ml  Net -550 ml   Filed Weights   03/08/24 1338 03/09/24 0500  Weight: 95.7 kg 96.1 kg    Examination:  General exam:  Appears calm and comfortable  Respiratory system: BL cracklesRespiratory effort normal. Cardiovascular system: S1 & S2 heard, RRR.  Gastrointestinal system: Abdomen is nondistended, soft and nontender Central nervous system: Alert and oriented. No focal neurological deficits. Extremities: left BKA  Data Reviewed: I have personally reviewed following labs and imaging studies  CBC: Recent Labs  Lab 03/08/24 1333 03/08/24 2103 03/09/24 0556  WBC 6.4  --  9.9  NEUTROABS 3.7  --   --   HGB 10.8* 10.5* 9.8*  HCT 37.5 36.5 34.0*  MCV 96.4  --  95.0  PLT 454*  --  495*   Basic Metabolic Panel: Recent Labs  Lab 03/08/24 1333 03/08/24 1541  NA 141  --   K 3.3*  --   CL 103  --   CO2 28  --   GLUCOSE 176*  --   BUN 9  --   CREATININE 0.73  --   CALCIUM  8.2*  --   MG  --  2.2  PHOS  --  2.4*   GFR: Estimated Creatinine Clearance: 76.8 mL/min (by C-G formula based on SCr of 0.73 mg/dL). Liver Function Tests: Recent Labs  Lab 03/08/24 1333  AST 27  ALT 23  ALKPHOS 77  BILITOT 1.0  PROT 6.8  ALBUMIN  3.4*   No results for input(s): LIPASE, AMYLASE in the last 168 hours. No results for input(s): AMMONIA in the last 168 hours. Coagulation Profile: No results for input(s): INR, PROTIME in the last 168 hours. Cardiac Enzymes: No results for input(s): CKTOTAL, CKMB, CKMBINDEX, TROPONINI in the last 168 hours. BNP (last 3 results) Recent Labs    03/08/24 1402  PROBNP 1,747.0*   HbA1C: No results for input(s): HGBA1C in the last 72 hours. CBG: Recent Labs  Lab 03/08/24 1822 03/08/24 2058 03/08/24 2351 03/09/24 0202 03/09/24 0526  GLUCAP 385* 425* 370* 298* 262*   Lipid Profile: No results for input(s): CHOL, HDL, LDLCALC, TRIG, CHOLHDL, LDLDIRECT in the last 72 hours. Thyroid Function Tests: No results for input(s): TSH, T4TOTAL, FREET4, T3FREE, THYROIDAB in the last 72 hours. Anemia Panel: No results for input(s):  VITAMINB12, FOLATE, FERRITIN, TIBC, IRON, RETICCTPCT in the last 72 hours. Sepsis Labs: Recent Labs  Lab 03/08/24 1541  PROCALCITON <0.10    Recent Results (from the past 240 hours)  Resp panel by RT-PCR (RSV, Flu A&B, Covid) Anterior Nasal Swab     Status: Abnormal   Collection Time: 03/08/24  2:02 PM   Specimen: Anterior Nasal Swab  Result Value Ref Range Status   SARS Coronavirus 2 by RT PCR NEGATIVE NEGATIVE Final    Comment: (NOTE) SARS-CoV-2 target nucleic acids are NOT DETECTED.  The SARS-CoV-2 RNA is generally detectable in upper respiratory specimens during the acute phase of infection. The lowest concentration of SARS-CoV-2 viral copies this assay can detect is 138 copies/mL. A negative result does not preclude SARS-Cov-2 infection and should not be used as the sole basis for treatment or other patient management decisions. A negative result may occur with  improper specimen collection/handling, submission of specimen other than nasopharyngeal swab, presence of  viral mutation(s) within the areas targeted by this assay, and inadequate number of viral copies(<138 copies/mL). A negative result must be combined with clinical observations, patient history, and epidemiological information. The expected result is Negative.  Fact Sheet for Patients:  bloggercourse.com  Fact Sheet for Healthcare Providers:  seriousbroker.it  This test is no t yet approved or cleared by the [place]  FDA and  has been authorized for detection and/or diagnosis of SARS-CoV-2 by FDA under an Emergency Use Authorization (EUA). This EUA will remain  in effect (meaning this test can be used) for the duration of the COVID-19 declaration under Section 564(b)(1) of the Act, 21 U.S.C.section 360bbb-3(b)(1), unless the authorization is terminated  or revoked sooner.       Influenza A by PCR POSITIVE (A) NEGATIVE Final   Influenza B  by PCR NEGATIVE NEGATIVE Final    Comment: (NOTE) The Xpert Xpress SARS-CoV-2/FLU/RSV plus assay is intended as an aid in the diagnosis of influenza from Nasopharyngeal swab specimens and should not be used as a sole basis for treatment. Nasal washings and aspirates are unacceptable for Xpert Xpress SARS-CoV-2/FLU/RSV testing.  Fact Sheet for Patients: bloggercourse.com  Fact Sheet for Healthcare Providers: seriousbroker.it  This test is not yet approved or cleared by the [place]  FDA and has been authorized for detection and/or diagnosis of SARS-CoV-2 by FDA under an Emergency Use Authorization (EUA). This EUA will remain in effect (meaning this test can be used) for the duration of the COVID-19 declaration under Section 564(b)(1) of the Act, 21 U.S.C. section 360bbb-3(b)(1), unless the authorization is terminated or revoked.     Resp Syncytial Virus by PCR NEGATIVE NEGATIVE Final    Comment: (NOTE) Fact Sheet for Patients: bloggercourse.com  Fact Sheet for Healthcare Providers: seriousbroker.it  This test is not yet approved or cleared by the [place]  FDA and has been authorized for detection and/or diagnosis of SARS-CoV-2 by FDA under an Emergency Use Authorization (EUA). This EUA will remain in effect (meaning this test can be used) for the duration of the COVID-19 declaration under Section 564(b)(1) of the Act, 21 U.S.C. section 360bbb-3(b)(1), unless the authorization is terminated or revoked.  Performed at [facility], [address], [place], [place] [address]          Radiology Studies: DG Chest Port 1 View Result Date: 03/08/2024 EXAM: 1 VIEW(S) XRAY OF THE CHEST 03/08/2024 02:19:00 PM COMPARISON: Compared with 12/28/2023. CLINICAL HISTORY: SOB (shortness of breath). Postoperative history: Wheezing. FINDINGS: LUNGS AND PLEURA:  Shallow inspiration. Linear atelectasis or scarring in the left midlung. Right lung is clear. No vascular congestion or edema. No pleural effusion. No pneumothorax. HEART AND MEDIASTINUM: Cardiac enlargement. Calcification of the aorta. Mediastinal contours appear intact. BONES AND SOFT TISSUES: No acute osseous abnormality. IMPRESSION: 1. Linear atelectasis or scarring in the left midlung. 2. Cardiac enlargement without pulmonary edema. Electronically signed by: Elsie Gravely MD 03/08/2024 02:24 PM EST RP Workstation: [id number]        Scheduled Meds:  dapagliflozin  propanediol  10 mg Oral Daily   furosemide   20 mg Intravenous BID   insulin  aspart  0-20 Units Subcutaneous TID WC   insulin  glargine  20 Units Subcutaneous QHS   ipratropium-albuterol   3 mL Nebulization QID   levothyroxine   125 mcg Oral Q0600   lisinopril   5 mg Oral Daily   pantoprazole  (PROTONIX ) IV  40 mg Intravenous Q12H   phosphorus  500 mg Oral QID   predniSONE   40 mg Oral Q  breakfast   Continuous Infusions:   LOS: 0 days    Time spent: 35 Minutes    Kynedi Profitt A Muhanad Torosyan, MD [facility]   If 7PM-7AM, please contact night-coverage [email or web address]  03/09/2024, 7:58 AM

## 2024-03-09 NOTE — TOC Initial Note (Addendum)
 Transition of Care [facility]) - Initial/Assessment Note    Patient Details  Name: Tammy Good MRN: [id number] Date of Birth: [birth year]  Transition of Care [facility]) CM/SW Contact:    Bascom Service, RN Phone Number: 03/09/2024, 12:47 PM  Clinical Narrative: Patient states d/c plan home-has had [facility] for HHC-will use again. Decline ST SNF if recc. Has cane,rw,3n1.Has own transportation.  Expected Discharge Plan: Home/Self Care Barriers to Discharge: Continued Medical Work up   Patient Goals and CMS Choice Patient states their goals for this hospitalization and ongoing recovery are:: Home CMS Medicare.gov Compare Post Acute Care list provided to:: Patient Choice offered to / list presented to : Patient [place] ownership interest in [facility].provided to:: Patient    Expected Discharge Plan and Services   Discharge Planning Services: CM Consult Post Acute Care Choice: Resumption of Svcs/PTA Provider Living arrangements for the past 2 months: Single Family Home                                      Prior Living Arrangements/Services Living arrangements for the past 2 months: Single Family Home Lives with:: Self   Do you feel safe going back to the place where you live?: Yes               Activities of Daily Living      Permission Sought/Granted Permission sought to share information with : Case Manager Permission granted to share information with : Yes, Verbal Permission Granted              Emotional Assessment              Admission diagnosis:  Melena [K92.1] Influenza A [J10.1] Acute hypoxic respiratory failure (HCC) [J96.01] Patient Active Problem List   Diagnosis Date Noted   Influenza A 03/08/2024   Mild protein malnutrition 03/08/2024   Acute on chronic combined systolic and diastolic heart failure (HCC) 03/08/2024   COPD with acute exacerbation (HCC) 03/08/2024   Hypophosphatemia 03/08/2024   Melena 03/08/2024    Chronic anticoagulation 12/28/2023   Renal insufficiency 12/28/2023   Nocturia more than twice per night 02/03/2022   Unilateral complete BKA, left, initial encounter (HCC) 01/17/2022   Normocytic anemia 01/13/2022   Paroxysmal atrial fibrillation (HCC) 08/15/2021   Medication monitoring encounter 08/02/2021   Hypothyroidism 06/02/2021   Ulcer of esophagus without bleeding    Pressure injury of skin 05/31/2021   Symptomatic anemia 05/29/2021   GI bleed 05/29/2021   Liver lesion 05/29/2021   Vertigo 05/24/2021   Generalized weakness 05/24/2021   Thrombocytosis 04/23/2021   Fatty liver 06/19/2020   Atherosclerotic heart disease of native coronary artery without angina pectoris 03/20/2020   Epigastric pain 03/20/2020   Laceration of left lower leg 09/13/2019   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 ([place]), [place] - [address] [place] ([place]) [place] [address] Phone: [phone] Fax: [phone]  [facility] - [place], [place] - [address] [place]  [address] Phone: [phone] Fax: [phone]  [facility] [address] [place] [address] Phone: [phone] Fax: [phone]     Social Drivers of Health (SDOH) Social History: SDOH Screenings   Food Insecurity: No Food Insecurity (03/08/2024)  Housing: Low Risk (03/08/2024)  Transportation Needs: No Transportation Needs (03/08/2024)  Utilities: Not At Risk (03/08/2024)  Depression (PHQ2-9): Low Risk (12/18/2021)  Social Connections: Socially Isolated (03/08/2024)  Tobacco Use: Medium Risk (03/08/2024)   SDOH Interventions:     Readmission Risk Interventions    07/17/2021    2:40 PM  Readmission Risk Prevention Plan  Transportation Screening Complete  Medication Review (RN Care Manager) Complete  PCP or Specialist appointment within 3-5 days of discharge Complete  HRI or Home Care Consult Complete  SW Recovery Care/Counseling Consult Complete  Palliative Care Screening Not Applicable  Skilled Nursing Facility Not Applicable

## 2024-03-09 NOTE — Inpatient Diabetes Management (Signed)
 Inpatient Diabetes Program Recommendations  AACE/ADA: New Consensus Statement on Inpatient Glycemic Control (2015)  Target Ranges:  Prepandial:   less than 140 mg/dL      Peak postprandial:   less than 180 mg/dL (1-2 hours)      Critically ill patients:  140 - 180 mg/dL   Lab Results  Component Value Date   GLUCAP 223 (H) 03/09/2024   HGBA1C 3.6 (L) 12/28/2023    Review of Glycemic Control  Latest Reference Range & Units 03/08/24 20:58 03/08/24 23:51 03/09/24 02:02 03/09/24 05:26 03/09/24 08:21  Glucose-Capillary 70 - 99 mg/dL 574 (H) 629 (H) 701 (H) 262 (H) 223 (H)   Flu A +, elevated BNP Diabetes history: DM 2 Outpatient Diabetes medications: Novolog  0-10 units tid, Basaglar  42 units qhs, Farxiga  10 mg Daily Current orders for Inpatient glycemic control:  Farxiga  10 mg Daily Lantus  20 units qhs Novolog  0-20 units tid   PO prednisone  40 mg Daily (ordered today)  Note A1c is low due to low Hgb levels  Inpatient Diabetes Program Recommendations:    -   Increase Lantus  to 25 units -   Add Novolog  3 units tid meal coverage if eating >50% of meals while on steroids  Thanks,  Clotilda Bull RN, MSN, BC-ADM Inpatient Diabetes Coordinator Team Pager [phone] (8a-5p)

## 2024-03-09 NOTE — Evaluation (Signed)
 Physical Therapy Evaluation Patient Details Name: Tammy Good MRN: [id number] DOB: [birth year] Today's Date: 03/09/2024  History of Present Illness  73 y.o. female who presented to the ED with complaints of shortness of breath, cough, wheezing, fatigue, headache, decreased appetite.  admitted with flu A and acute on chronic HF. PMH: significant of CAD, history of STEMI, history of percutaneous coronary angioplasty, chronic systolic CHF, type 2 diabetes, class I obesity, L BKA , hyperlipidemia, hypothyroidism, essential hypertension, peripheral arterial disease, panhypopituitary, obesity  Clinical Impression  Pt admitted with above diagnosis.  Pt  reports independence at her baseline. Uses RW, cane or w/c depending on the day  deferred donning prosthesis d/t small open wound on anterior knee. Pt performed lateral scoot transfer bed > drop arm recliner  Pt currently with functional limitations due to the deficits listed below (see PT Problem List). Pt will benefit from acute skilled PT to increase their independence and safety with mobility to allow discharge.           If plan is discharge home, recommend the following: A little help with bathing/dressing/bathroom;Help with stairs or ramp for entrance;A lot of help with bathing/dressing/bathroom   Can travel by private vehicle        Equipment Recommendations None recommended by PT  Recommendations for Other Services       Functional Status Assessment Patient has had a recent decline in their functional status and demonstrates the ability to make significant improvements in function in a reasonable and predictable amount of time.     Precautions / Restrictions Precautions Precautions: Fall Restrictions Weight Bearing Restrictions Per Provider Order: No      Mobility  Bed Mobility Overal bed mobility: Needs Assistance Bed Mobility: Supine to Sit     Supine to sit: Supervision     General bed mobility comments: for  safety    Transfers Overall transfer level: Needs assistance   Transfers: Bed to chair/wheelchair/BSC            Lateral/Scoot Transfers: Contact guard assist General transfer comment: deferred donning prosthesis d/t small wound on knee. pt able to scoot laterally bed to chair with CGA for safety    Ambulation/Gait                  Stairs            Wheelchair Mobility     Tilt Bed    Modified Rankin (Stroke Patients Only)       Balance Overall balance assessment: Needs assistance Sitting-balance support: No upper extremity supported, Single extremity supported Sitting balance-Leahy Scale: Good         Standing balance comment: NT                             Pertinent Vitals/Pain Pain Assessment Pain Assessment: No/denies pain    Home Living Family/patient expects to be discharged to:: Private residence Living Arrangements: Alone Available Help at Discharge: Family (family and lots of church friends) Type of Home: House         Home Layout: One level Home Equipment: Agricultural Consultant (2 wheels);Cane - single point;Wheelchair - manual;Toilet riser;BSC/3in1;Hand held shower head;Shower seat      Prior Function Prior Level of Function : Independent/Modified Independent;Driving             Mobility Comments: uses RW or cane or w/c depending on the day ADLs Comments: independent in ADLs,  and iADLs  Extremity/Trunk Assessment   Upper Extremity Assessment Upper Extremity Assessment: Overall WFL for tasks assessed    Lower Extremity Assessment Lower Extremity Assessment: Overall WFL for tasks assessed;LLE deficits/detail LLE Deficits / Details: small open wound anterior knee;  L BKA. strength 4+/5, AROM WFL       Communication   Communication Communication: No apparent difficulties    Cognition Arousal: Alert Behavior During Therapy: WFL for tasks assessed/performed   PT - Cognitive impairments: No apparent  impairments                         Following commands: Intact       Cueing Cueing Techniques: Verbal cues     General Comments      Exercises     Assessment/Plan    PT Assessment Patient needs continued PT services  PT Problem List Decreased activity tolerance;Decreased mobility;Decreased balance       PT Treatment Interventions DME instruction;Therapeutic activities;Gait training;Functional mobility training;Therapeutic exercise;Patient/family education    PT Goals (Current goals can be found in the Care Plan section)  Acute Rehab PT Goals Patient Stated Goal: to go home, feel better PT Goal Formulation: With patient Time For Goal Achievement: 03/23/24 Potential to Achieve Goals: Good    Frequency Min 3X/week     Co-evaluation               AM-PAC PT 6 Clicks Mobility  Outcome Measure Help needed turning from your back to your side while in a flat bed without using bedrails?: A Little Help needed moving from lying on your back to sitting on the side of a flat bed without using bedrails?: A Little Help needed moving to and from a bed to a chair (including a wheelchair)?: A Little Help needed standing up from a chair using your arms (e.g., wheelchair or bedside chair)?: A Lot Help needed to walk in hospital room?: A Lot Help needed climbing 3-5 steps with a railing? : Total 6 Click Score: 14    End of Session   Activity Tolerance: Patient tolerated treatment well Patient left: with call bell/phone within reach;in chair;with chair alarm set Nurse Communication: Mobility status PT Visit Diagnosis: Other abnormalities of gait and mobility (R26.89)    Time: 8352-8292 PT Time Calculation (min) (ACUTE ONLY): 20 min   Charges:   PT Evaluation $PT Eval Low Complexity: 1 Low   PT General Charges $$ ACUTE PT VISIT: 1 Visit         Angelea Penny, PT  Acute Rehab Dept [facility]) [phone]  03/09/2024   [facility] 03/09/2024, 5:43 PM

## 2024-03-10 DIAGNOSIS — J101 Influenza due to other identified influenza virus with other respiratory manifestations: Secondary | ICD-10-CM | POA: Diagnosis not present

## 2024-03-10 LAB — GLUCOSE, CAPILLARY
Glucose-Capillary: 164 mg/dL — ABNORMAL HIGH (ref 70–99)
Glucose-Capillary: 128 mg/dL — ABNORMAL HIGH (ref 70–99)
Glucose-Capillary: 213 mg/dL — ABNORMAL HIGH (ref 70–99)
Glucose-Capillary: 296 mg/dL — ABNORMAL HIGH (ref 70–99)
Glucose-Capillary: 329 mg/dL — ABNORMAL HIGH (ref 70–99)

## 2024-03-10 LAB — CBC
HCT: 35.8 % — ABNORMAL LOW (ref 36.0–46.0)
Hemoglobin: 10.4 g/dL — ABNORMAL LOW (ref 12.0–15.0)
MCH: 27.4 pg (ref 26.0–34.0)
MCHC: 29.1 g/dL — ABNORMAL LOW (ref 30.0–36.0)
MCV: 94.5 fL (ref 80.0–100.0)
Platelets: 508 K/uL — ABNORMAL HIGH (ref 150–400)
RBC: 3.79 MIL/uL — ABNORMAL LOW (ref 3.87–5.11)
RDW: 17.7 % — ABNORMAL HIGH (ref 11.5–15.5)
WBC: 7.5 K/uL (ref 4.0–10.5)
nRBC: 0 % (ref 0.0–0.2)

## 2024-03-10 LAB — BASIC METABOLIC PANEL WITH GFR
Anion gap: 11 (ref 5–15)
BUN: 15 mg/dL (ref 8–23)
CO2: 26 mmol/L (ref 22–32)
Calcium: 8.5 mg/dL — ABNORMAL LOW (ref 8.9–10.3)
Chloride: 99 mmol/L (ref 98–111)
Creatinine, Ser: 0.88 mg/dL (ref 0.44–1.00)
GFR, Estimated: >60 mL/min (ref >60)
Glucose, Bld: 245 mg/dL — ABNORMAL HIGH (ref 70–99)
Potassium: 3.7 mmol/L (ref 3.5–5.1)
Sodium: 136 mmol/L (ref 135–145)

## 2024-03-10 MED ORDER — POLYETHYLENE GLYCOL 3350 17 G PO PACK
17.0000 g | PACK | Freq: Two times a day (BID) | ORAL | Status: DC
  Administered 2024-03-10 – 2024-03-13 (×5): 17 g via ORAL
  Filled 2024-03-10 (×7): qty 1

## 2024-03-10 MED ORDER — APIXABAN 5 MG PO TABS
5.0000 mg | ORAL_TABLET | Freq: Two times a day (BID) | ORAL | Status: DC
  Administered 2024-03-10 – 2024-03-14 (×9): 5 mg via ORAL
  Filled 2024-03-10 (×9): qty 1

## 2024-03-10 MED ORDER — ARFORMOTEROL TARTRATE 15 MCG/2ML IN NEBU
15.0000 ug | INHALATION_SOLUTION | Freq: Two times a day (BID) | RESPIRATORY_TRACT | Status: DC
  Administered 2024-03-10 – 2024-03-14 (×9): 15 ug via RESPIRATORY_TRACT
  Filled 2024-03-10 (×9): qty 2

## 2024-03-10 MED ORDER — MAGNESIUM SULFATE 2 GM/50ML IV SOLN
2.0000 g | Freq: Once | INTRAVENOUS | Status: AC
  Administered 2024-03-10: 11:00:00 2 g via INTRAVENOUS
  Filled 2024-03-10: qty 50

## 2024-03-10 MED ORDER — ALBUTEROL SULFATE (2.5 MG/3ML) 0.083% IN NEBU: 2.5000 mg | INHALATION_SOLUTION | RESPIRATORY_TRACT | Status: DC | PRN

## 2024-03-10 MED ORDER — FUROSEMIDE 10 MG/ML IJ SOLN
20.0000 mg | Freq: Two times a day (BID) | INTRAMUSCULAR | Status: DC
  Administered 2024-03-10 – 2024-03-14 (×8): 20 mg via INTRAVENOUS
  Filled 2024-03-10 (×8): qty 2

## 2024-03-10 MED ORDER — METHYLPREDNISOLONE SODIUM SUCC 40 MG IJ SOLR
40.0000 mg | Freq: Every day | INTRAMUSCULAR | Status: DC
  Administered 2024-03-10 – 2024-03-14 (×5): 40 mg via INTRAVENOUS
  Filled 2024-03-10 (×5): qty 1

## 2024-03-10 NOTE — TOC Progression Note (Signed)
 Transition of Care [facility]) - Progression Note   Patient Details  Name: Tammy Good MRN: [id number] Date of Birth: [birth year]  [facility]) CM/SW Contact  Duwaine GORMAN Aran, LCSW Phone Number: 03/10/2024, 1:00 PM  Clinical Narrative: PT evaluation recommended HHPT. CSW send referral to [facility] in hub per patient's preference, which was accepted. CSW updated patient and patient requested OT or an aide to assist with ADLs. HHPT/OT orders placed by hospitalist. CSW updated [place] with [facility].  Expected Discharge Plan: Home w Home Health Services Barriers to Discharge: Continued Medical Work up  Expected Discharge Plan and Services In-house Referral: Clinical Social Work Discharge Planning Services: CM Consult Post Acute Care Choice: Home Health Living arrangements for the past 2 months: Single Family Home           DME Arranged: N/A DME Agency: NA HH Arranged: PT, OT HH Agency: [facility] Date HH Agency Contacted: 03/10/24 Representative spoke with at [facility] Agency: Referral made in hub  Social Drivers of Health (SDOH) Interventions SDOH Screenings   Food Insecurity: No Food Insecurity (03/08/2024)  Housing: Low Risk (03/08/2024)  Transportation Needs: No Transportation Needs (03/08/2024)  Utilities: Not At Risk (03/08/2024)  Depression (PHQ2-9): Low Risk (12/18/2021)  Social Connections: Socially Isolated (03/08/2024)  Tobacco Use: Medium Risk (03/08/2024)   Readmission Risk Interventions    03/10/2024    1:00 PM 07/17/2021    2:40 PM  Readmission Risk Prevention Plan  Transportation Screening Complete Complete  HRI or Home Care Consult Complete   Social Work Consult for Recovery Care Planning/Counseling Complete   Palliative Care Screening Not Applicable   Medication Review Oceanographer) Complete Complete  PCP or Specialist appointment within 3-5 days of discharge  Complete  HRI or Home Care Consult  Complete  SW Recovery  Care/Counseling Consult  Complete  Palliative Care Screening  Not Applicable  Skilled Nursing Facility  Not Applicable

## 2024-03-10 NOTE — Plan of Care (Signed)
   Problem: Education: Goal: Knowledge of General Education information will improve Description Including pain rating scale, medication(s)/side effects and non-pharmacologic comfort measures Outcome: Progressing   Problem: Health Behavior/Discharge Planning: Goal: Ability to manage health-related needs will improve Outcome: Progressing

## 2024-03-10 NOTE — Progress Notes (Signed)
 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 She reports her stool has been soft dark and tarry.  Respiratory symptoms started over a week ago.  She continued to take iron supplement  Evaluation in the ED hemoglobin 10 potassium 3.3, chest x-ray linear atelectasis, flu influenza A+  Assessment & Plan:   Principal Problem:   Influenza A Active Problems:   Essential hypertension   Hypothyroidism   Normocytic anemia   CAD S/P percutaneous coronary angioplasty   Diabetes mellitus, type 2 (HCC)   Paroxysmal atrial fibrillation (HCC)   PAD (peripheral artery disease)   Hyperlipidemia associated with type 2 diabetes mellitus (HCC)   Hypokalemia   Thrombocytosis   Mild protein malnutrition   Acute on chronic combined systolic and diastolic heart failure (HCC)   COPD with acute exacerbation (HCC)   Hypophosphatemia   Melena  1-Influenza A+ COPD exacerbation ? [name] She report she doesn't have a history of COPD - Patient presents with fever, cough, shortness of breath and to have influenza A+ -Continue with Tamiflu  -she is wheezing a lot. Start Brovana  and Pulmicort . Change prednisone  to IV steroids.    Acute on chronic combined systolic and diastolic heart failure - Continue with IV Lasix , change to BID.  -2D echo: 55 to 60%.  Left ventricular diastolic parameters are consistent with grade 1 diastolic dysfunction right  ventricular size is normal no evidence of mitral stenosis. - Hold lisinopril  to avoid hypotension.  Melena -Recent admission for GI secondary to bleeding AVM, treated with APC.  -Continue PPI -Hold ferrous sulfate .  -Monitor Hb -Hemoglobin has remained stable, no bowel movement.  Will resume Eliquis  today.  History of Left BKA:  Supportive care  Essential hypertension - Hold lisinopril .  Continue Lasix   Hypothyroidism - Continue Synthroid   Normocytic anemia - Monitor hemoglobin due to concern for melena  CAD status post percutaneous coronary angioplasty PAD - Continue with statin.  She has been off of Plavix  due to GI bleed  Paroxysmal A-fib - Holding anticoagulation in the setting of melena  Diabetes type 2: - Continue Lantus .  NovoLog  with meal  Hyperlipidemia - Continue atorvastatin   Hypophosphatemia - Replace  Hypokalemia - Replace  Thrombocytosis - In setting of infectious.  Follow  Mild protein caloric malnutrition: - In  setting of acute illness   Estimated body mass index is 33.25 kg/m as calculated from the following:   Height as of 02/10/24: 5' 7 (1.702 m).   Weight as of this encounter: 96.3 kg.   DVT prophylaxis: Holding anticoagulation Code Status: Full code Family Communication: Care discussed with patient Disposition Plan:  Status is: Observation The patient remains OBS appropriate and will d/c before 2 midnights.    Consultants:  None  Procedures:  Echo  Antimicrobials:    Subjective: She is alert, she is having more wheezing today, shortness of breath.  Reports mild dizziness  Objective: Vitals:   03/09/24 2103 03/09/24 2104 03/10/24 0500 03/10/24 0520  BP:    (!) 114/51  Pulse:    68  Resp:  17  Temp:    98.5 F (36.9 C)  TempSrc:      SpO2: 98% 98%  98%  Weight:   96.3 kg     Intake/Output Summary (Last 24 hours) at 03/10/2024 0802 Last data filed at 03/10/2024 0520 Gross per 24 hour  Intake 600 ml   Output 3200 ml  Net -2600 ml   Filed Weights   03/08/24 1338 03/09/24 0500 03/10/24 0500  Weight: 95.7 kg 96.1 kg 96.3 kg    Examination:  General exam: NAD Respiratory system: BL crackles.  Cardiovascular system: S 1, S 2 RRR Gastrointestinal system: BS present, soft, nt Central nervous system: alert Extremities: left BKA  Data Reviewed: I have personally reviewed following labs and imaging studies  CBC: Recent Labs  Lab 03/08/24 1333 03/08/24 2103 03/09/24 0556  WBC 6.4  --  9.9  NEUTROABS 3.7  --   --   HGB 10.8* 10.5* 9.8*  HCT 37.5 36.5 34.0*  MCV 96.4  --  95.0  PLT 454*  --  495*   Basic Metabolic Panel: Recent Labs  Lab 03/08/24 1333 03/08/24 1541 03/09/24 0556  NA 141  --  140  K 3.3*  --  5.1  CL 103  --  102  CO2 28  --  28  GLUCOSE 176*  --  278*  BUN 9  --  15  CREATININE 0.73  --  0.87  CALCIUM  8.2*  --  8.7*  MG  --  2.2  --   PHOS  --  2.4*  --    GFR: Estimated Creatinine Clearance: 70.7 mL/min (by C-G formula based on SCr of 0.87 mg/dL). Liver Function Tests: Recent Labs  Lab 03/08/24 1333 03/09/24 0556  AST 27 14*  ALT 23 19  ALKPHOS 77 76  BILITOT 1.0 0.9  PROT 6.8 6.9  ALBUMIN  3.4* 3.5   No results for input(s): LIPASE, AMYLASE in the last 168 hours. No results for input(s): AMMONIA in the last 168 hours. Coagulation Profile: No results for input(s): INR, PROTIME in the last 168 hours. Cardiac Enzymes: No results for input(s): CKTOTAL, CKMB, CKMBINDEX, TROPONINI in the last 168 hours. BNP (last 3 results) Recent Labs    03/08/24 1402  PROBNP 1,747.0*   HbA1C: No results for input(s): HGBA1C in the last 72 hours. CBG: Recent Labs  Lab 03/09/24 1716 03/09/24 2044 03/09/24 2346 03/10/24 0521 03/10/24 0758  GLUCAP 302* 328* 250* 164* 128*   Lipid Profile: No results for input(s): CHOL, HDL, LDLCALC, TRIG, CHOLHDL, LDLDIRECT in the last 72 hours. Thyroid Function Tests: No  results for input(s): TSH, T4TOTAL, FREET4, T3FREE, THYROIDAB in the last 72 hours. Anemia Panel: No results for input(s): VITAMINB12, FOLATE, FERRITIN, TIBC, IRON, RETICCTPCT in the last 72 hours. Sepsis Labs: Recent Labs  Lab 03/08/24 1541  PROCALCITON <0.10    Recent Results (from the past 240 hours)  Resp panel by RT-PCR (RSV, Flu A&B, Covid) Anterior Nasal Swab     Status: Abnormal   Collection Time: 03/08/24  2:02 PM   Specimen: Anterior Nasal Swab  Result Value Ref Range Status   SARS Coronavirus 2 by RT PCR NEGATIVE NEGATIVE Final    Comment: (NOTE) SARS-CoV-2 target nucleic acids are NOT DETECTED.  The SARS-CoV-2 RNA is generally detectable in upper respiratory specimens during the acute phase of infection. The lowest concentration of SARS-CoV-2 viral copies this assay can detect is 138 copies/mL. A negative result does not preclude SARS-Cov-2 infection and should not be  used as the sole basis for treatment or other patient management decisions. A negative result may occur with  improper specimen collection/handling, submission of specimen other than nasopharyngeal swab, presence of viral mutation(s) within the areas targeted by this assay, and inadequate number of viral copies(<138 copies/mL). A negative result must be combined with clinical observations, patient history, and epidemiological information. The expected result is Negative.  Fact Sheet for Patients:  bloggercourse.com  Fact Sheet for Healthcare Providers:  seriousbroker.it  This test is no t yet approved or cleared by the [place]  FDA and  has been authorized for detection and/or diagnosis of SARS-CoV-2 by FDA under an Emergency Use Authorization (EUA). This EUA will remain  in effect (meaning this test can be used) for the duration of the COVID-19 declaration under Section 564(b)(1) of the Act, 21 U.S.C.section  360bbb-3(b)(1), unless the authorization is terminated  or revoked sooner.       Influenza A by PCR POSITIVE (A) NEGATIVE Final   Influenza B by PCR NEGATIVE NEGATIVE Final    Comment: (NOTE) The Xpert Xpress SARS-CoV-2/FLU/RSV plus assay is intended as an aid in the diagnosis of influenza from Nasopharyngeal swab specimens and should not be used as a sole basis for treatment. Nasal washings and aspirates are unacceptable for Xpert Xpress SARS-CoV-2/FLU/RSV testing.  Fact Sheet for Patients: bloggercourse.com  Fact Sheet for Healthcare Providers: seriousbroker.it  This test is not yet approved or cleared by the [place]  FDA and has been authorized for detection and/or diagnosis of SARS-CoV-2 by FDA under an Emergency Use Authorization (EUA). This EUA will remain in effect (meaning this test can be used) for the duration of the COVID-19 declaration under Section 564(b)(1) of the Act, 21 U.S.C. section 360bbb-3(b)(1), unless the authorization is terminated or revoked.     Resp Syncytial Virus by PCR NEGATIVE NEGATIVE Final    Comment: (NOTE) Fact Sheet for Patients: bloggercourse.com  Fact Sheet for Healthcare Providers: seriousbroker.it  This test is not yet approved or cleared by the [place]  FDA and has been authorized for detection and/or diagnosis of SARS-CoV-2 by FDA under an Emergency Use Authorization (EUA). This EUA will remain in effect (meaning this test can be used) for the duration of the COVID-19 declaration under Section 564(b)(1) of the Act, 21 U.S.C. section 360bbb-3(b)(1), unless the authorization is terminated or revoked.  Performed at [facility], [address], [place], [place] [address]          Radiology Studies: VAS US  LOWER EXTREMITY VENOUS (DVT) Result Date: 03/10/2024  Lower Venous DVT Study Patient Name:   Tammy Good  Date of Exam:   03/09/2024 Medical Rec #: [id number]          Accession #:    [id number] Date of Birth: 1950-09-08           Patient Gender: F Patient Age:   36 years Exam Location:  [facility] Procedure:      VAS US  LOWER EXTREMITY VENOUS (DVT) Referring Phys: DAVID ORTIZ --------------------------------------------------------------------------------  Indications: Edema.  Risk Factors: None identified. Limitations: Left BKA. Comparison Study: No prior studies. Performing Technologist: Cordella Collet RVT  Examination Guidelines: A complete evaluation includes B-mode imaging, spectral Doppler, color Doppler, and power Doppler as needed of all accessible portions of each vessel. Bilateral testing is considered an integral part of a complete examination. Limited examinations for reoccurring indications may be performed as noted. The reflux portion of the exam is performed with the patient in reverse  Trendelenburg.  +---------+---------------+---------+-----------+----------+--------------+ RIGHT    CompressibilityPhasicitySpontaneityPropertiesThrombus Aging +---------+---------------+---------+-----------+----------+--------------+ CFV      Full           Yes      Yes                                 +---------+---------------+---------+-----------+----------+--------------+ SFJ      Full                                                        +---------+---------------+---------+-----------+----------+--------------+ FV Prox  Full                                                        +---------+---------------+---------+-----------+----------+--------------+ FV Mid   Full                                                        +---------+---------------+---------+-----------+----------+--------------+ FV DistalFull                                                        +---------+---------------+---------+-----------+----------+--------------+ PFV       Full                                                        +---------+---------------+---------+-----------+----------+--------------+ POP      Full           Yes      Yes                                 +---------+---------------+---------+-----------+----------+--------------+ PTV      Full                                                        +---------+---------------+---------+-----------+----------+--------------+ PERO     Full                                                        +---------+---------------+---------+-----------+----------+--------------+   +---------+---------------+---------+-----------+----------+-------------------+ LEFT     CompressibilityPhasicitySpontaneityPropertiesThrombus Aging      +---------+---------------+---------+-----------+----------+-------------------+ CFV      Full           Yes      Yes                                      +---------+---------------+---------+-----------+----------+-------------------+  SFJ      Full                                                             +---------+---------------+---------+-----------+----------+-------------------+ FV Prox  Full                                                             +---------+---------------+---------+-----------+----------+-------------------+ FV Mid   Full                                                             +---------+---------------+---------+-----------+----------+-------------------+ FV DistalFull                                                             +---------+---------------+---------+-----------+----------+-------------------+ PFV      Full                                                             +---------+---------------+---------+-----------+----------+-------------------+ POP      Full           Yes      Yes                                       +---------+---------------+---------+-----------+----------+-------------------+ PTV                                                   Not well visualized +---------+---------------+---------+-----------+----------+-------------------+ PERO                                                  Not well visualized +---------+---------------+---------+-----------+----------+-------------------+     Summary: RIGHT: - There is no evidence of deep vein thrombosis in the lower extremity.  - No cystic structure found in the popliteal fossa.  LEFT: - There is no evidence of deep vein thrombosis in the lower extremity. However, portions of this examination were limited- see technologist comments above.  - No cystic structure found in the popliteal fossa.  *See table(s) above for measurements and observations. Electronically signed by Fonda Rim on 03/10/2024 at 7:51:35 AM.    Final    ECHOCARDIOGRAM COMPLETE Result Date: 03/09/2024  ECHOCARDIOGRAM REPORT   Patient Name:   Tammy Good Date of Exam: 03/09/2024 Medical Rec #:  [id number]         Height:       67.0 in Accession #:    [id number]        Weight:       211.9 lb Date of Birth:  1950-07-02          BSA:          2.072 m Patient Age:    73 years          BP:           127/63 mmHg Patient Gender: F                 HR:           72 bpm. Exam Location:  Inpatient Procedure: 2D Echo, Cardiac Doppler, Color Doppler and Intracardiac            Opacification Agent (Both Spectral and Color Flow Doppler were            utilized during procedure). Indications:    CHF  History:        Patient has prior history of Echocardiogram examinations, most                 recent 04/24/2021. CAD, COPD; Risk Factors:Hypertension, Diabetes                 and Dyslipidemia.  Sonographer:    Philomena Daring Referring Phys: [id number] DAVID MANUEL ORTIZ  Sonographer Comments: Suboptimal apical window and no subcostal window. IMPRESSIONS  1. Left ventricular ejection fraction, by  estimation, is 55 to 60%. The left ventricle has normal function. Left ventricular endocardial border not optimally defined to evaluate regional wall motion. Left ventricular diastolic parameters are consistent with Grade I diastolic dysfunction (impaired relaxation).  2. Right ventricular systolic function is normal. The right ventricular size is normal.  3. The mitral valve is grossly normal. No evidence of mitral valve regurgitation. No evidence of mitral stenosis.  4. The aortic valve is tricuspid. There is mild calcification of the aortic valve. Aortic valve regurgitation is not visualized. Aortic valve sclerosis/calcification is present, without any evidence of aortic stenosis. Conclusion(s)/Recommendation(s): Technical;ly very limited study due to poor sound wave transmission. Structures not seen well even with Definity  contrast. LV function looks grossly normal. FINDINGS  Left Ventricle: Left ventricular ejection fraction, by estimation, is 55 to 60%. The left ventricle has normal function. Left ventricular endocardial border not optimally defined to evaluate regional wall motion. Definity  contrast agent was given IV to delineate the left ventricular endocardial borders. The left ventricular internal cavity size was normal in size. There is no left ventricular hypertrophy. Left ventricular diastolic parameters are consistent with Grade I diastolic dysfunction (impaired relaxation). Right Ventricle: The right ventricular size is normal. No increase in right ventricular wall thickness. Right ventricular systolic function is normal. Left Atrium: Left atrial size was not well visualized. Right Atrium: Right atrial size was not well visualized. Pericardium: There is no evidence of pericardial effusion. Mitral Valve: The mitral valve is grossly normal. No evidence of mitral valve regurgitation. No evidence of mitral valve stenosis. Tricuspid Valve: The tricuspid valve is normal in structure. Tricuspid valve  regurgitation is trivial. No evidence of tricuspid stenosis. Aortic Valve: The aortic valve is tricuspid. There is mild calcification of the aortic valve. Aortic valve regurgitation is not visualized. Aortic valve sclerosis/calcification is present,  without any evidence of aortic stenosis. Pulmonic Valve: The pulmonic valve was normal in structure. Pulmonic valve regurgitation is not visualized. No evidence of pulmonic stenosis. Aorta: The aortic root is normal in size and structure. Venous: The inferior vena cava was not well visualized. IAS/Shunts: No atrial level shunt detected by color flow Doppler.  LEFT VENTRICLE PLAX 2D LVIDd:         3.70 cm   Diastology LVIDs:         2.60 cm   LV e' medial:    4.24 cm/s LV PW:         1.30 cm   LV E/e' medial:  17.2 LV IVS:        1.40 cm   LV e' lateral:   5.55 cm/s LVOT diam:     1.80 cm   LV E/e' lateral: 13.1 LV SV:         36 LV SV Index:   17 LVOT Area:     2.54 cm  RIGHT VENTRICLE RV S prime:     9.46 cm/s TAPSE (M-mode): 2.1 cm LEFT ATRIUM             Index        RIGHT ATRIUM           Index LA diam:        2.70 cm 1.30 cm/m   RA Area:     16.10 cm LA Vol (A2C):   62.9 ml 30.35 ml/m  RA Volume:   37.80 ml  18.24 ml/m LA Vol (A4C):   59.6 ml 28.76 ml/m LA Biplane Vol: 61.3 ml 29.58 ml/m  AORTIC VALVE LVOT Vmax:   62.10 cm/s LVOT Vmean:  42.000 cm/s LVOT VTI:    0.141 m  AORTA Ao Root diam: 2.70 cm Ao Asc diam:  3.00 cm MITRAL VALVE MV Area (PHT): 3.28 cm    SHUNTS MV E velocity: 72.80 cm/s  Systemic VTI:  0.14 m MV A velocity: 87.40 cm/s  Systemic Diam: 1.80 cm MV E/A ratio:  0.83 Toribio Fuel MD Electronically signed by Toribio Fuel MD Signature Date/Time: 03/09/2024/1:13:18 PM    Final    DG Chest Port 1 View Result Date: 03/08/2024 EXAM: 1 VIEW(S) XRAY OF THE CHEST 03/08/2024 02:19:00 PM COMPARISON: Compared with 12/28/2023. CLINICAL HISTORY: SOB (shortness of breath). Postoperative history: Wheezing. FINDINGS: LUNGS AND PLEURA: Shallow  inspiration. Linear atelectasis or scarring in the left midlung. Right lung is clear. No vascular congestion or edema. No pleural effusion. No pneumothorax. HEART AND MEDIASTINUM: Cardiac enlargement. Calcification of the aorta. Mediastinal contours appear intact. BONES AND SOFT TISSUES: No acute osseous abnormality. IMPRESSION: 1. Linear atelectasis or scarring in the left midlung. 2. Cardiac enlargement without pulmonary edema. Electronically signed by: Elsie Gravely MD 03/08/2024 02:24 PM EST RP Workstation: [id number]        Scheduled Meds:  budesonide  (PULMICORT ) nebulizer solution  0.25 mg Nebulization BID   dapagliflozin  propanediol  10 mg Oral Daily   furosemide   20 mg Intravenous Daily   insulin  aspart  0-20 Units Subcutaneous TID WC   insulin  aspart  3 Units Subcutaneous TID WC   insulin  glargine  20 Units Subcutaneous QHS   ipratropium-albuterol   3 mL Nebulization QID   levothyroxine   125 mcg Oral Q0600   oseltamivir   75 mg Oral BID   pantoprazole  (PROTONIX ) IV  40 mg Intravenous Q12H   polyethylene glycol  17 g Oral Daily   predniSONE   40 mg Oral Q breakfast  Continuous Infusions:   LOS: 1 day    Time spent: 35 Minutes    Apolo Cutshaw A Lyndzie Zentz, MD [facility]   If 7PM-7AM, please contact night-coverage [email or web address]  03/10/2024, 8:02 AM

## 2024-03-10 NOTE — Progress Notes (Signed)
 Heart Failure Navigator Progress Note  Assessed for Heart & Vascular TOC clinic readiness.  Patient does not meet criteria due to EF 55-60%, Influenza Flu + , has a scheduled Vascular appointment on 04/18/2024. .   Navigator will sign off at this time.    Stephane Haddock, BSN, Scientist, Clinical (histocompatibility And Immunogenetics) Only

## 2024-03-11 DIAGNOSIS — J101 Influenza due to other identified influenza virus with other respiratory manifestations: Secondary | ICD-10-CM | POA: Diagnosis not present

## 2024-03-11 LAB — BASIC METABOLIC PANEL WITH GFR
Anion gap: 8 (ref 5–15)
BUN: 18 mg/dL (ref 8–23)
CO2: 30 mmol/L (ref 22–32)
Calcium: 8.8 mg/dL — ABNORMAL LOW (ref 8.9–10.3)
Chloride: 97 mmol/L — ABNORMAL LOW (ref 98–111)
Creatinine, Ser: 0.76 mg/dL (ref 0.44–1.00)
GFR, Estimated: >60 mL/min
Glucose, Bld: 246 mg/dL — ABNORMAL HIGH (ref 70–99)
Potassium: 4 mmol/L (ref 3.5–5.1)
Sodium: 135 mmol/L (ref 135–145)

## 2024-03-11 LAB — CBC
HCT: 35.2 % — ABNORMAL LOW (ref 36.0–46.0)
Hemoglobin: 10.5 g/dL — ABNORMAL LOW (ref 12.0–15.0)
MCH: 27.4 pg (ref 26.0–34.0)
MCHC: 29.8 g/dL — ABNORMAL LOW (ref 30.0–36.0)
MCV: 91.9 fL (ref 80.0–100.0)
Platelets: 520 K/uL — ABNORMAL HIGH (ref 150–400)
RBC: 3.83 MIL/uL — ABNORMAL LOW (ref 3.87–5.11)
RDW: 17.3 % — ABNORMAL HIGH (ref 11.5–15.5)
WBC: 8.1 K/uL (ref 4.0–10.5)
nRBC: 0 % (ref 0.0–0.2)

## 2024-03-11 LAB — GLUCOSE, CAPILLARY
Glucose-Capillary: 195 mg/dL — ABNORMAL HIGH (ref 70–99)
Glucose-Capillary: 243 mg/dL — ABNORMAL HIGH (ref 70–99)
Glucose-Capillary: 371 mg/dL — ABNORMAL HIGH (ref 70–99)
Glucose-Capillary: 278 mg/dL — ABNORMAL HIGH (ref 70–99)

## 2024-03-11 MED ORDER — ORAL CARE MOUTH RINSE: 15.0000 mL | OROMUCOSAL | Status: DC | PRN

## 2024-03-11 MED ORDER — INSULIN ASPART 100 UNIT/ML IJ SOLN
5.0000 [IU] | Freq: Three times a day (TID) | INTRAMUSCULAR | Status: DC
  Administered 2024-03-11 – 2024-03-12 (×2): 5 [IU] via SUBCUTANEOUS
  Filled 2024-03-11 (×2): qty 5

## 2024-03-11 NOTE — Consult Note (Addendum)
 WOC Nurse Consult Note: Reason for Consult: Consult requested for left knee/stump wound.  Performed remotely after review of progress notes and photos in the EMR.  Left knee with full thickness healing wound, red and moist.      Dressing procedure/placement/frequency: Topical treatment orders provided for bedside nurse to perform as follows to promote moist healing and protect from further injury: Cut piece of Xeroform and apply to left stump wounds Q day, cover with foam dressing.  Change foam dressing Q 3 days or PRN soiling.   Please re-consult if further assistance is needed.  Thank-you,  Stephane Fought MSN, RN, CWOCN, CWCN-AP, CNS

## 2024-03-11 NOTE — Inpatient Diabetes Management (Signed)
 Inpatient Diabetes Program Recommendations  AACE/ADA: New Consensus Statement on Inpatient Glycemic Control (2015)  Target Ranges:  Prepandial:   less than 140 mg/dL      Peak postprandial:   less than 180 mg/dL (1-2 hours)      Critically ill patients:  140 - 180 mg/dL   Lab Results  Component Value Date   GLUCAP 243 (H) 03/11/2024   HGBA1C 3.6 (L) 12/28/2023    Review of Glycemic Control  Latest Reference Range & Units 03/10/24 07:58 03/10/24 11:47 03/10/24 16:20 03/10/24 21:09 03/11/24 07:40 03/11/24 11:52  Glucose-Capillary 70 - 99 mg/dL 871 (H) 786 (H) 703 (H) 329 (H) 195 (H) 243 (H)  (H): Data is abnormally high   Inpatient Diabetes Program Recommendations:    Please consider increasing meal coverage:  Novolog  6 units TID with meals if she consumes at least 50%.  Thank you, Wyvonna Pinal, MSN, CDCES Diabetes Coordinator Inpatient Diabetes Program [phone] (team pager from 8a-5p)

## 2024-03-11 NOTE — Progress Notes (Signed)
 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 She reports her stool has been soft dark and tarry.  Respiratory symptoms started over a week ago.  She continued to take iron supplement  Evaluation in the ED hemoglobin 10 potassium 3.3, chest x-ray linear atelectasis, flu influenza A+  Assessment & Plan:   Principal Problem:   Influenza A Active Problems:   Essential hypertension   Hypothyroidism   Normocytic anemia   CAD S/P percutaneous coronary angioplasty   Diabetes mellitus, type 2 (HCC)   Paroxysmal atrial fibrillation (HCC)   PAD (peripheral artery disease)   Hyperlipidemia associated with type 2 diabetes mellitus (HCC)   Hypokalemia   Thrombocytosis   Mild protein malnutrition   Acute on chronic combined systolic and diastolic heart failure (HCC)   COPD with acute exacerbation (HCC)   Hypophosphatemia   Melena  1-Influenza A+ Acute Bronchitis COPD exacerbation ? [name] She report she doesn't have a history of COPD - Patient presents with fever, cough, shortness of breath and to have influenza A+ -Continue with Tamiflu  -Continue with Brovana  and Pulmicort .  -Continue with IV steroids.  -Duoneb Less wheezing today, breathing improved today compare to yesterday.   Acute on Chronic Combined Systolic and Diastolic Heart failure - Continue with IV Lasix  -2D echo: 55 to 60%.  Left ventricular diastolic parameters are  consistent with grade 1 diastolic dysfunction right ventricular size is normal no evidence of mitral stenosis. - Hold lisinopril  to avoid hypotension. Negative 4 L.  Monitor renal function.   Melena -Recent admission for GI secondary to bleeding AVM, treated with APC.  -Continue PPI -Hold ferrous sulfate .  -Monitor Hb -Hemoglobin has remained stable, eliquis  was resume 12/18.  History of Left BKA:  Supportive care  Essential hypertension - Hold lisinopril .  Continue Lasix   Hypothyroidism - Continue Synthroid   Normocytic anemia - Monitor hemoglobin due to concern for melena  CAD status post percutaneous coronary angioplasty PAD - Continue with statin.  She has been off of Plavix  due to GI bleed  Paroxysmal A-fib - Holding anticoagulation in the setting of melena  Diabetes type 2: - Continue Lantus .  NovoLog  with meal Increase meal coverage.   Hyperlipidemia - Continue atorvastatin   Hypophosphatemia - Replace  Hypokalemia - Replace  Thrombocytosis - In setting of infectious.  Follow  Mild protein caloric malnutrition: - In  setting of acute illness   Estimated body mass index is 30.42 kg/m as calculated from the following:   Height as of this encounter: 5' 7 (1.702 m).   Weight as of this encounter: 88.1 kg.   DVT prophylaxis: Holding anticoagulation Code Status: Full code Family Communication: Care discussed with patient Disposition Plan:  Status is: Observation The patient remains OBS appropriate and will d/c before 2 midnights.    Consultants:  None  Procedures:  Echo  Antimicrobials:    Subjective: Feels better today, still coughing  Breathing better but not at baseline.   Objective: Vitals:   03/10/24 2014 03/11/24 0430 03/11/24 0824 03/11/24 0929  BP: 136/64 137/79  Pulse: 74 74    Resp: 19 18    Temp: 98.2 F (36.8 C) 97.7 F (36.5 C)    TempSrc: Oral Oral    SpO2: 100% 98% 98%   Weight:    88.1 kg  Height:         Intake/Output Summary (Last 24 hours) at 03/11/2024 1103 Last data filed at 03/10/2024 2130 Gross per 24 hour  Intake 720 ml  Output 2100 ml  Net -1380 ml   Filed Weights   03/10/24 0500 03/10/24 1700 03/11/24 0929  Weight: 96.3 kg 96.3 kg 88.1 kg    Examination:  General exam: NAD Respiratory system: BL wheezing, ronchus.  Cardiovascular system: S 1, S 2 RRR Gastrointestinal system: BS present, soft, nt Central nervous system: Alert Extremities: left BKA  Data Reviewed: I have personally reviewed following labs and imaging studies  CBC: Recent Labs  Lab 03/08/24 1333 03/08/24 2103 03/09/24 0556 03/10/24 1044 03/11/24 0545  WBC 6.4  --  9.9 7.5 8.1  NEUTROABS 3.7  --   --   --   --   HGB 10.8* 10.5* 9.8* 10.4* 10.5*  HCT 37.5 36.5 34.0* 35.8* 35.2*  MCV 96.4  --  95.0 94.5 91.9  PLT 454*  --  495* 508* 520*   Basic Metabolic Panel: Recent Labs  Lab 03/08/24 1333 03/08/24 1541 03/09/24 0556 03/10/24 1044 03/11/24 0545  NA 141  --  140 136 135  K 3.3*  --  5.1 3.7 4.0  CL 103  --  102 99 97*  CO2 28  --  28 26 30   GLUCOSE 176*  --  278* 245* 246*  BUN 9  --  15 15 18   CREATININE 0.73  --  0.87 0.88 0.76  CALCIUM  8.2*  --  8.7* 8.5* 8.8*  MG  --  2.2  --   --   --   PHOS  --  2.4*  --   --   --    GFR: Estimated Creatinine Clearance: 73.5 mL/min (by C-G formula based on SCr of 0.76 mg/dL). Liver Function Tests: Recent Labs  Lab 03/08/24 1333 03/09/24 0556  AST 27 14*  ALT 23 19  ALKPHOS 77 76  BILITOT 1.0 0.9  PROT 6.8 6.9  ALBUMIN  3.4* 3.5   No results for input(s): LIPASE, AMYLASE in the last 168 hours. No results for input(s): AMMONIA in the last 168 hours. Coagulation Profile: No results for input(s): INR, PROTIME in the last 168 hours. Cardiac Enzymes: No results for input(s): CKTOTAL, CKMB, CKMBINDEX, TROPONINI in the last 168 hours. BNP (last 3 results) Recent Labs    03/08/24 1402  PROBNP 1,747.0*    HbA1C: No results for input(s): HGBA1C in the last 72 hours. CBG: Recent Labs  Lab 03/10/24 0758 03/10/24 1147 03/10/24 1620 03/10/24 2109 03/11/24 0740  GLUCAP 128* 213* 296* 329* 195*   Lipid Profile: No results for input(s): CHOL, HDL, LDLCALC, TRIG, CHOLHDL, LDLDIRECT in the last 72 hours. Thyroid Function Tests: No results for input(s): TSH, T4TOTAL, FREET4, T3FREE, THYROIDAB in the last 72 hours. Anemia Panel: No results for input(s): VITAMINB12, FOLATE, FERRITIN, TIBC, IRON, RETICCTPCT in the last 72 hours. Sepsis Labs: Recent Labs  Lab 03/08/24 1541  PROCALCITON <0.10    Recent Results (from the past 240 hours)  Resp panel by RT-PCR (RSV, Flu A&B, Covid) Anterior Nasal Swab     Status: Abnormal   Collection Time: 03/08/24  2:02 PM   Specimen: Anterior Nasal Swab  Result Value Ref Range Status   SARS Coronavirus 2 by RT PCR NEGATIVE NEGATIVE Final    Comment: (NOTE) SARS-CoV-2 target nucleic acids are NOT DETECTED.  The SARS-CoV-2 RNA is generally detectable in upper respiratory specimens during the acute phase of infection. The lowest concentration of SARS-CoV-2 viral copies this assay can detect is 138 copies/mL. A negative result does not preclude SARS-Cov-2 infection and should not be used as the sole basis for treatment or other patient management decisions. A negative result may occur with  improper specimen collection/handling, submission of specimen other than nasopharyngeal swab, presence of viral mutation(s) within the areas targeted by this assay, and inadequate number of viral copies(<138 copies/mL). A negative result must be combined with clinical observations, patient history, and epidemiological information. The expected result is Negative.  Fact Sheet for Patients:  bloggercourse.com  Fact Sheet for Healthcare Providers:  seriousbroker.it  This test is  no t yet approved or cleared by the [place]  FDA and  has been authorized for detection and/or diagnosis of SARS-CoV-2 by FDA under an Emergency Use Authorization (EUA). This EUA will remain  in effect (meaning this test can be used) for the duration of the COVID-19 declaration under Section 564(b)(1) of the Act, 21 U.S.C.section 360bbb-3(b)(1), unless the authorization is terminated  or revoked sooner.       Influenza A by PCR POSITIVE (A) NEGATIVE Final   Influenza B by PCR NEGATIVE NEGATIVE Final    Comment: (NOTE) The Xpert Xpress SARS-CoV-2/FLU/RSV plus assay is intended as an aid in the diagnosis of influenza from Nasopharyngeal swab specimens and should not be used as a sole basis for treatment. Nasal washings and aspirates are unacceptable for Xpert Xpress SARS-CoV-2/FLU/RSV testing.  Fact Sheet for Patients: bloggercourse.com  Fact Sheet for Healthcare Providers: seriousbroker.it  This test is not yet approved or cleared by the [place]  FDA and has been authorized for detection and/or diagnosis of SARS-CoV-2 by FDA under an Emergency Use Authorization (EUA). This EUA will remain in effect (meaning this test can be used) for the duration of the COVID-19 declaration under Section 564(b)(1) of the Act, 21 U.S.C. section 360bbb-3(b)(1), unless the authorization is terminated or revoked.     Resp Syncytial Virus by PCR NEGATIVE NEGATIVE Final    Comment: (NOTE) Fact Sheet for Patients: bloggercourse.com  Fact Sheet for Healthcare Providers: seriousbroker.it  This test is not yet approved or cleared by the [place]  FDA and has been authorized for detection and/or diagnosis of SARS-CoV-2 by FDA under an Emergency Use Authorization (EUA). This EUA will remain in effect (meaning this test can be used) for the duration of the COVID-19 declaration under Section  564(b)(1) of the Act, 21 U.S.C. section 360bbb-3(b)(1), unless the authorization is terminated or revoked.  Performed at [facility], [address] [place], [place] [address]          Radiology Studies: No results found.       Scheduled Meds:  apixaban   5 mg Oral BID   arformoterol   15 mcg Nebulization BID   budesonide  (PULMICORT ) nebulizer solution  0.25 mg Nebulization BID   dapagliflozin  propanediol  10 mg Oral Daily   furosemide   20 mg Intravenous BID   insulin  aspart  0-20 Units Subcutaneous TID WC   insulin  aspart  3 Units Subcutaneous TID WC   insulin  glargine  20 Units Subcutaneous QHS   ipratropium-albuterol   3 mL Nebulization QID   levothyroxine   125 mcg Oral Q0600  methylPREDNISolone  (SOLU-MEDROL ) injection  40 mg Intravenous Daily   oseltamivir   75 mg Oral BID   pantoprazole  (PROTONIX ) IV  40 mg Intravenous Q12H   polyethylene glycol  17 g Oral BID   Continuous Infusions:   LOS: 2 days    Time spent: 35 Minutes    Tuesday Terlecki A Marta Bouie, MD [facility]   If 7PM-7AM, please contact night-coverage [email or web address]  03/11/2024, 11:03 AM   "

## 2024-03-12 DIAGNOSIS — J101 Influenza due to other identified influenza virus with other respiratory manifestations: Secondary | ICD-10-CM | POA: Diagnosis not present

## 2024-03-12 LAB — BASIC METABOLIC PANEL WITH GFR
Anion gap: 9 (ref 5–15)
BUN: 23 mg/dL (ref 8–23)
CO2: 30 mmol/L (ref 22–32)
Calcium: 8.8 mg/dL — ABNORMAL LOW (ref 8.9–10.3)
Chloride: 97 mmol/L — ABNORMAL LOW (ref 98–111)
Creatinine, Ser: 0.82 mg/dL (ref 0.44–1.00)
GFR, Estimated: >60 mL/min
Glucose, Bld: 298 mg/dL — ABNORMAL HIGH (ref 70–99)
Potassium: 4.8 mmol/L (ref 3.5–5.1)
Sodium: 136 mmol/L (ref 135–145)

## 2024-03-12 LAB — GLUCOSE, CAPILLARY
Glucose-Capillary: 160 mg/dL — ABNORMAL HIGH (ref 70–99)
Glucose-Capillary: 239 mg/dL — ABNORMAL HIGH (ref 70–99)
Glucose-Capillary: 386 mg/dL — ABNORMAL HIGH (ref 70–99)
Glucose-Capillary: 186 mg/dL — ABNORMAL HIGH (ref 70–99)

## 2024-03-12 MED ORDER — PANTOPRAZOLE SODIUM 40 MG PO TBEC
40.0000 mg | DELAYED_RELEASE_TABLET | Freq: Two times a day (BID) | ORAL | Status: DC
  Administered 2024-03-12 – 2024-03-14 (×4): 40 mg via ORAL
  Filled 2024-03-12 (×4): qty 1

## 2024-03-12 MED ORDER — INSULIN GLARGINE 100 UNIT/ML ~~LOC~~ SOLN
25.0000 [IU] | Freq: Every day | SUBCUTANEOUS | Status: DC
  Administered 2024-03-12 – 2024-03-13 (×2): 25 [IU] via SUBCUTANEOUS
  Filled 2024-03-12 (×3): qty 0.25

## 2024-03-12 MED ORDER — INSULIN ASPART 100 UNIT/ML IJ SOLN
6.0000 [IU] | Freq: Three times a day (TID) | INTRAMUSCULAR | Status: DC
  Administered 2024-03-12 – 2024-03-14 (×7): 6 [IU] via SUBCUTANEOUS
  Filled 2024-03-12 (×7): qty 6

## 2024-03-12 MED ORDER — IPRATROPIUM-ALBUTEROL 0.5-2.5 (3) MG/3ML IN SOLN
3.0000 mL | Freq: Three times a day (TID) | RESPIRATORY_TRACT | Status: DC
  Administered 2024-03-12 – 2024-03-14 (×6): 3 mL via RESPIRATORY_TRACT
  Filled 2024-03-12 (×6): qty 3

## 2024-03-12 NOTE — Progress Notes (Signed)
 " PROGRESS NOTE    Tammy Good  FMW:[id number] DOB: [birth year] DOA: 03/08/2024 PCP: Tammy Senior, MD   Brief Narrative: 73 year old with past medical history significant for CAD, history of STEMI, history of percutaneous coronary angioplasty, chronic systolic heart failure, type 2 diabetes, hypothyroidism, essential hypertension, peripheral artery disease, panhypopituitary, recently admitted from 10/6 until 12/30/18/2025 due to upper GI bleed, acute blood loss anemia presented to the ED complaining of shortness of breath, cough, wheezing fatigue headache decreased appetite nausea but no vomiting.  She reports her stool has been soft dark and tarry.  Respiratory symptoms started over a week ago.  She continued to take iron supplement  Evaluation in the ED hemoglobin 10 potassium 3.3, chest x-ray linear atelectasis, flu influenza A+ Patient admitted for influenza A and acute bronchitis and heart failure exacerbation.  She continued to have bilateral expiratory wheezing, no breathing at baseline.  Still requiring IV steroid and Lasix .  Assessment & Plan:   Principal Problem:   Influenza A Active Problems:   Essential hypertension   Hypothyroidism   Normocytic anemia   CAD S/P percutaneous coronary angioplasty   Diabetes mellitus, type 2 (HCC)   Paroxysmal atrial fibrillation (HCC)   PAD (peripheral artery disease)   Hyperlipidemia associated with type 2 diabetes mellitus (HCC)   Hypokalemia   Thrombocytosis   Mild protein malnutrition   Acute on chronic combined systolic and diastolic heart failure (HCC)   COPD with acute exacerbation (HCC)   Hypophosphatemia   Melena  1-Influenza A+ Acute Bronchitis COPD exacerbation ? [name] She report she doesn't have a history of COPD - Patient presents with fever, cough, shortness of breath and to have influenza A+ -Continue with Tamiflu  day 4/5 -Continue with Brovana  and Pulmicort .  -Continue with IV steroids.  -Duoneb -Continue to have  bilateral expiratory wheezing.  Continue with current management.  Acute on Chronic Combined Systolic and Diastolic Heart failure - Continue with IV Lasix  -2D echo: 55 to 60%.  Left ventricular diastolic parameters are consistent with grade 1 diastolic dysfunction right ventricular size is normal no evidence of mitral stenosis. - Hold lisinopril  to avoid hypotension. -Negative 6 L.  Monitor renal function.  Labs pending.  Continue IV Lasix   Melena -Recent admission for GI secondary to bleeding AVM, treated with APC.  -Continue PPI -Hold ferrous sulfate .  -Monitor Hb -Hemoglobin has remained stable, eliquis  was resume 12/18.  History of Left BKA:  Supportive care  Essential hypertension - Hold lisinopril .  Continue Lasix   Hypothyroidism - Continue Synthroid   Normocytic anemia - Monitor hemoglobin due to concern for melena  CAD status post percutaneous coronary angioplasty PAD - Continue with statin.  She has been off of Plavix  due to GI bleed  Paroxysmal A-fib - Holding anticoagulation in the setting of melena  Diabetes type 2: - Increase Lantus  to 25 units daily, increase meal coverage to 6 units  Hyperlipidemia - Continue atorvastatin   Hypophosphatemia - Replaced  Hypokalemia - Replace  Thrombocytosis - In setting of infectious.  Follow  Mild protein caloric malnutrition: - In  setting of acute illness   Estimated body mass index is 30.18 kg/m as calculated from the following:   Height as of this encounter: 5' 7 (1.702 m).   Weight as of this encounter: 87.4 kg.   DVT prophylaxis: Holding anticoagulation Code Status: Full code Family Communication: Care discussed with patient Disposition Plan:  Status is: Observation The patient remains OBS appropriate and will d/c before 2 midnights.    Consultants:  None  Procedures:  Echo  Antimicrobials:    Subjective: Patient is still having shortness of breath, and cough.  She continued to have  bilateral rhonchorous and wheezing.  Still not breathing at baseline.  Objective: Vitals:   03/11/24 2135 03/12/24 0500 03/12/24 0821 03/12/24 1156  BP: (!) 129/44     Pulse: 83     Resp: (!) 22     Temp: 98.2 F (36.8 C)     TempSrc: Oral     SpO2: 98%  97% 92%  Weight:  87.4 kg    Height:        Intake/Output Summary (Last 24 hours) at 03/12/2024 1409 Last data filed at 03/12/2024 1128 Gross per 24 hour  Intake 960 ml  Output 3000 ml  Net -2040 ml   Filed Weights   03/10/24 1700 03/11/24 0929 03/12/24 0500  Weight: 96.3 kg 88.1 kg 87.4 kg    Examination:  General exam: No acute distress Respiratory system: Lateral expiratory wheezing and rhonchorous, normal respiratory effort Cardiovascular system: S1, S2 regular rhythm and rate Gastrointestinal system: Sounds, present soft nontender nondistended Central nervous system: Alert, oriented following commands nonfocal Extremities: left BKA  Data Reviewed: I have personally reviewed following labs and imaging studies  CBC: Recent Labs  Lab 03/08/24 1333 03/08/24 2103 03/09/24 0556 03/10/24 1044 03/11/24 0545  WBC 6.4  --  9.9 7.5 8.1  NEUTROABS 3.7  --   --   --   --   HGB 10.8* 10.5* 9.8* 10.4* 10.5*  HCT 37.5 36.5 34.0* 35.8* 35.2*  MCV 96.4  --  95.0 94.5 91.9  PLT 454*  --  495* 508* 520*   Basic Metabolic Panel: Recent Labs  Lab 03/08/24 1333 03/08/24 1541 03/09/24 0556 03/10/24 1044 03/11/24 0545  NA 141  --  140 136 135  K 3.3*  --  5.1 3.7 4.0  CL 103  --  102 99 97*  CO2 28  --  28 26 30   GLUCOSE 176*  --  278* 245* 246*  BUN 9  --  15 15 18   CREATININE 0.73  --  0.87 0.88 0.76  CALCIUM  8.2*  --  8.7* 8.5* 8.8*  MG  --  2.2  --   --   --   PHOS  --  2.4*  --   --   --    GFR: Estimated Creatinine Clearance: 73.2 mL/min (by C-G formula based on SCr of 0.76 mg/dL). Liver Function Tests: Recent Labs  Lab 03/08/24 1333 03/09/24 0556  AST 27 14*  ALT 23 19  ALKPHOS 77 76  BILITOT 1.0  0.9  PROT 6.8 6.9  ALBUMIN  3.4* 3.5   No results for input(s): LIPASE, AMYLASE in the last 168 hours. No results for input(s): AMMONIA in the last 168 hours. Coagulation Profile: No results for input(s): INR, PROTIME in the last 168 hours. Cardiac Enzymes: No results for input(s): CKTOTAL, CKMB, CKMBINDEX, TROPONINI in the last 168 hours. BNP (last 3 results) Recent Labs    03/08/24 1402  PROBNP 1,747.0*   HbA1C: No results for input(s): HGBA1C in the last 72 hours. CBG: Recent Labs  Lab 03/11/24 1152 03/11/24 1648 03/11/24 2137 03/12/24 0753 03/12/24 1144  GLUCAP 243* 371* 278* 160* 239*   Lipid Profile: No results for input(s): CHOL, HDL, LDLCALC, TRIG, CHOLHDL, LDLDIRECT in the last 72 hours. Thyroid Function Tests: No results for input(s): TSH, T4TOTAL, FREET4, T3FREE, THYROIDAB in the last 72 hours. Anemia Panel: No results  for input(s): VITAMINB12, FOLATE, FERRITIN, TIBC, IRON, RETICCTPCT in the last 72 hours. Sepsis Labs: Recent Labs  Lab 03/08/24 1541  PROCALCITON <0.10    Recent Results (from the past 240 hours)  Resp panel by RT-PCR (RSV, Flu A&B, Covid) Anterior Nasal Swab     Status: Abnormal   Collection Time: 03/08/24  2:02 PM   Specimen: Anterior Nasal Swab  Result Value Ref Range Status   SARS Coronavirus 2 by RT PCR NEGATIVE NEGATIVE Final    Comment: (NOTE) SARS-CoV-2 target nucleic acids are NOT DETECTED.  The SARS-CoV-2 RNA is generally detectable in upper respiratory specimens during the acute phase of infection. The lowest concentration of SARS-CoV-2 viral copies this assay can detect is 138 copies/mL. A negative result does not preclude SARS-Cov-2 infection and should not be used as the sole basis for treatment or other patient management decisions. A negative result may occur with  improper specimen collection/handling, submission of specimen other than nasopharyngeal swab, presence  of viral mutation(s) within the areas targeted by this assay, and inadequate number of viral copies(<138 copies/mL). A negative result must be combined with clinical observations, patient history, and epidemiological information. The expected result is Negative.  Fact Sheet for Patients:  bloggercourse.com  Fact Sheet for Healthcare Providers:  seriousbroker.it  This test is no t yet approved or cleared by the [place]  FDA and  has been authorized for detection and/or diagnosis of SARS-CoV-2 by FDA under an Emergency Use Authorization (EUA). This EUA will remain  in effect (meaning this test can be used) for the duration of the COVID-19 declaration under Section 564(b)(1) of the Act, 21 U.S.C.section 360bbb-3(b)(1), unless the authorization is terminated  or revoked sooner.       Influenza A by PCR POSITIVE (A) NEGATIVE Final   Influenza B by PCR NEGATIVE NEGATIVE Final    Comment: (NOTE) The Xpert Xpress SARS-CoV-2/FLU/RSV plus assay is intended as an aid in the diagnosis of influenza from Nasopharyngeal swab specimens and should not be used as a sole basis for treatment. Nasal washings and aspirates are unacceptable for Xpert Xpress SARS-CoV-2/FLU/RSV testing.  Fact Sheet for Patients: bloggercourse.com  Fact Sheet for Healthcare Providers: seriousbroker.it  This test is not yet approved or cleared by the [place]  FDA and has been authorized for detection and/or diagnosis of SARS-CoV-2 by FDA under an Emergency Use Authorization (EUA). This EUA will remain in effect (meaning this test can be used) for the duration of the COVID-19 declaration under Section 564(b)(1) of the Act, 21 U.S.C. section 360bbb-3(b)(1), unless the authorization is terminated or revoked.     Resp Syncytial Virus by PCR NEGATIVE NEGATIVE Final    Comment: (NOTE) Fact Sheet for  Patients: bloggercourse.com  Fact Sheet for Healthcare Providers: seriousbroker.it  This test is not yet approved or cleared by the [place]  FDA and has been authorized for detection and/or diagnosis of SARS-CoV-2 by FDA under an Emergency Use Authorization (EUA). This EUA will remain in effect (meaning this test can be used) for the duration of the COVID-19 declaration under Section 564(b)(1) of the Act, 21 U.S.C. section 360bbb-3(b)(1), unless the authorization is terminated or revoked.  Performed at [facility], [address] [place], [place] [address]          Radiology Studies: No results found.       Scheduled Meds:  apixaban   5 mg Oral BID   arformoterol   15 mcg Nebulization BID   budesonide  (PULMICORT ) nebulizer solution  0.25  mg Nebulization BID   dapagliflozin  propanediol  10 mg Oral Daily   furosemide   20 mg Intravenous BID   insulin  aspart  0-20 Units Subcutaneous TID WC   insulin  aspart  6 Units Subcutaneous TID WC   insulin  glargine  25 Units Subcutaneous QHS   ipratropium-albuterol   3 mL Nebulization QID   levothyroxine   125 mcg Oral Q0600   methylPREDNISolone  (SOLU-MEDROL ) injection  40 mg Intravenous Daily   oseltamivir   75 mg Oral BID   pantoprazole   40 mg Oral BID   polyethylene glycol  17 g Oral BID   Continuous Infusions:   LOS: 3 days    Time spent: 35 Minutes    Reyhan Moronta A Montrail Mehrer, MD [facility]   If 7PM-7AM, please contact night-coverage [email or web address]  03/12/2024, 2:09 PM   "

## 2024-03-12 NOTE — Plan of Care (Signed)
   Problem: Nutrition: Goal: Adequate nutrition will be maintained Outcome: Progressing

## 2024-03-12 NOTE — Evaluation (Signed)
 Occupational Therapy Evaluation Patient Details Name: Tammy Good MRN: [id number] DOB: 06/17/1950 Today's Date: 03/12/2024   History of Present Illness   73 y.o. female who presented to the ED with complaints of shortness of breath, cough, wheezing, fatigue, headache, decreased appetite.  admitted with flu A and acute on chronic HF. PMH: significant of CAD, history of STEMI, history of percutaneous coronary angioplasty, chronic systolic CHF, type 2 diabetes, class I obesity, L BKA , hyperlipidemia, hypothyroidism, essential hypertension, peripheral arterial disease, panhypopituitary, obesity     Clinical Impressions Pt is near her baseline modified independence in ADLs. She is required to walk into her bathroom due to it being not accessible to at w/c. Will follow to ensure she is able to ambulate with her prosthesis and complete toileting mod I prior to returning home. Do not anticipate need for post acute OT. All DME needs are met.     If plan is discharge home, recommend the following:   Assistance with cooking/housework     Functional Status Assessment   Patient has had a recent decline in their functional status and demonstrates the ability to make significant improvements in function in a reasonable and predictable amount of time.     Equipment Recommendations   None recommended by OT     Recommendations for Other Services         Precautions/Restrictions   Precautions Precautions: Fall Recall of Precautions/Restrictions: Intact Restrictions Weight Bearing Restrictions Per Provider Order: No     Mobility Bed Mobility Overal bed mobility: Modified Independent                  Transfers Overall transfer level: Needs assistance   Transfers: Bed to chair/wheelchair/BSC            Lateral/Scoot Transfers: Contact guard assist General transfer comment: pt declined remaining in chair at end of session, wanted to take a nap and attempt  ambulation later and eat lunch in chair      Balance Overall balance assessment: Needs assistance   Sitting balance-Leahy Scale: Good         Standing balance comment: NT                           ADL either performed or assessed with clinical judgement   ADL Overall ADL's : Needs assistance/impaired Eating/Feeding: Independent;Bed level   Grooming: Set up;Sitting   Upper Body Bathing: Set up;Sitting   Lower Body Bathing: Set up;Sitting/lateral leans   Upper Body Dressing : Set up;Sitting   Lower Body Dressing: Set up;Sitting/lateral leans                 General ADL Comments: pt must walk into her bathroom with her prosthesis as it is not accessible to her w/c     Vision Ability to See in Adequate Light: 0 Adequate Patient Visual Report: No change from baseline       Perception         Praxis         Pertinent Vitals/Pain Pain Assessment Pain Assessment: No/denies pain     Extremity/Trunk Assessment Upper Extremity Assessment Upper Extremity Assessment: Overall WFL for tasks assessed;Right hand dominant   Lower Extremity Assessment Lower Extremity Assessment: Defer to PT evaluation   Cervical / Trunk Assessment Cervical / Trunk Assessment: Normal   Communication Communication Factors Affecting Communication: Hearing impaired   Cognition Arousal: Alert Behavior During Therapy: WFL for tasks assessed/performed Cognition: No  apparent impairments                               Following commands: Intact       Cueing  General Comments   Cueing Techniques: Verbal cues      Exercises     Shoulder Instructions      Home Living Family/patient expects to be discharged to:: Private residence Living Arrangements: Alone Available Help at Discharge: Family;Friend(s);Available PRN/intermittently Type of Home: House       Home Layout: One level     Bathroom Shower/Tub: Chief Strategy Officer:  Standard     Home Equipment: Agricultural Consultant (2 wheels);Cane - single point;Wheelchair - manual;Toilet riser;BSC/3in1;Hand held shower head;Tub bench          Prior Functioning/Environment Prior Level of Function : Independent/Modified Independent;Driving             Mobility Comments: uses RW or cane or w/c depending on the day ADLs Comments: modified independent in ADLs,  and iADLs    OT Problem List: Impaired balance (sitting and/or standing)   OT Treatment/Interventions: Self-care/ADL training;Patient/family education      OT Goals(Current goals can be found in the care plan section)   Acute Rehab OT Goals OT Goal Formulation: With patient Time For Goal Achievement: 03/25/24 Potential to Achieve Goals: Good   OT Frequency:  Min 2X/week    Co-evaluation              AM-PAC OT 6 Clicks Daily Activity     Outcome Measure Help from another person eating meals?: None Help from another person taking care of personal grooming?: None Help from another person toileting, which includes using toliet, bedpan, or urinal?: A Lot Help from another person bathing (including washing, rinsing, drying)?: None Help from another person to put on and taking off regular upper body clothing?: None Help from another person to put on and taking off regular lower body clothing?: None 6 Click Score: 22   End of Session    Activity Tolerance: Patient tolerated treatment well Patient left: in bed;with call bell/phone within reach;with bed alarm set  OT Visit Diagnosis: Unsteadiness on feet (R26.81)                Time: 8961-8945 OT Time Calculation (min): 16 min Charges:  OT General Charges $OT Visit: 1 Visit OT Evaluation $OT Eval Low Complexity: 1 Low  Mliss HERO, OTR/L Acute Rehabilitation Services Office: [phone]   Kennth Mliss Helling 03/12/2024, 11:03 AM

## 2024-03-13 DIAGNOSIS — J101 Influenza due to other identified influenza virus with other respiratory manifestations: Secondary | ICD-10-CM | POA: Diagnosis not present

## 2024-03-13 LAB — CBC
HCT: 36.1 % (ref 36.0–46.0)
Hemoglobin: 11 g/dL — ABNORMAL LOW (ref 12.0–15.0)
MCH: 27.8 pg (ref 26.0–34.0)
MCHC: 30.5 g/dL (ref 30.0–36.0)
MCV: 91.4 fL (ref 80.0–100.0)
Platelets: 523 K/uL — ABNORMAL HIGH (ref 150–400)
RBC: 3.95 MIL/uL (ref 3.87–5.11)
RDW: 16.9 % — ABNORMAL HIGH (ref 11.5–15.5)
WBC: 8.6 K/uL (ref 4.0–10.5)
nRBC: 0 % (ref 0.0–0.2)

## 2024-03-13 LAB — BASIC METABOLIC PANEL WITH GFR
Anion gap: 7 (ref 5–15)
BUN: 29 mg/dL — ABNORMAL HIGH (ref 8–23)
CO2: 32 mmol/L (ref 22–32)
Calcium: 8.8 mg/dL — ABNORMAL LOW (ref 8.9–10.3)
Chloride: 98 mmol/L (ref 98–111)
Creatinine, Ser: 0.78 mg/dL (ref 0.44–1.00)
GFR, Estimated: >60 mL/min
Glucose, Bld: 192 mg/dL — ABNORMAL HIGH (ref 70–99)
Potassium: 4.7 mmol/L (ref 3.5–5.1)
Sodium: 137 mmol/L (ref 135–145)

## 2024-03-13 LAB — GLUCOSE, CAPILLARY
Glucose-Capillary: 208 mg/dL — ABNORMAL HIGH (ref 70–99)
Glucose-Capillary: 181 mg/dL — ABNORMAL HIGH (ref 70–99)
Glucose-Capillary: 360 mg/dL — ABNORMAL HIGH (ref 70–99)
Glucose-Capillary: 177 mg/dL — ABNORMAL HIGH (ref 70–99)

## 2024-03-13 NOTE — Progress Notes (Signed)
 " PROGRESS NOTE    Tammy Good  FMW:[id number] DOB: 12-Nov-1950 DOA: 03/08/2024 PCP: Nichole Senior, MD    Brief Narrative:   Tammy Good is a 73 y.o. female with past medical history significant for HTN, chronic systolic/diastolic congestive heart failure, CAD/STEMI s/p percutaneous coronary angioplasty, hypothyroidism, peripheral artery disease s/p left BKA, panhypopituitarism, recent admission for GI bleed (10/6 - 10/8) who presented to [facility] ED on 03/08/2024 from home via EMS with shortness of breath, cough, wheezing, headache, nausea, fatigue and decreased appetite.  Onset of symptoms a week prior.  In the ED, temperature 98.2 F, HR 90, RR 22, BP 140/61, SpO2 96% on 5 L nasal cannula.  WBC 6.4, hemoglobin 10.8, platelet count of 454.  Sodium 141, potassium 3.3, chloride 103, CO2 28, glucose 176, BUN 9, creatinine 0.73.  AST 27, ALT 23, total bilirubin 1.0.  BNP 1747.  High-sensitivity troponin 60.  Procalcitonin less than 0.10.  Influenza A PCR positive.  Influenza B/RSV/COVID PCR negative.  Chest x-ray with linear atelectasis or scarring left midlung, cardiac enlargement without pulmonary edema.  Patient received IV furosemide , albuterol  neb, Solu-Medrol .  TRH consulted for admission for further evaluation management of acute hypoxic respite failure secondary to influenza A viral infection.  Assessment & Plan:   Influenza A viral infection Acute bronchitis Patient presenting with progressive shortness of breath, cough, wheezing associated with nausea, fatigue and decreased appetite x 1 week.  WC count 6.4.  Chest x-ray with no focal consolidation.  Influenza A PCR positive. --Tamiflu  75 mg p.o. twice daily Day # 5 of 5 -- Brovana  neb twice daily -- Pulmicort  neb twice daily -- Solu-Medrol  40 mg IV every 24 hours -- Albuterol  neb every 2 hours as needed wheezing/shortness of breath -- Oxygen weaned off  Acute on chronic combined systolic/diastolic congestive heart failure TTE  with LVEF 55-60%, grade 1 diastolic dysfunction, RV size normal with no evidence of mitral stenosis.  BNP elevated 1747. -- net negative 2.3L past 24 hours and net negative 9.3L since admission -- Wt 95.7>>87.7kg -- Furosemide  20 mg IV every 12 hours -- Strict I's and O's Daily weights  Recent GI bleed secondary to AVM Normocytic anemia Recently admitted and treated with APC. -- Hemoglobin 10.8>>9.8>>10.5>11.0; stable -- Protonix  40 mg p.o. twice daily -- Resume ferrous sulfate  on discharge  History of left BKA Supportive care  HTN Currently not on and hypertensives outpatient. -- BP 140/69 -- IV Lasix  as above  Hypothyroidism -- Levothyroxine  125 mcg p.o. daily  CAD s/p percutaneous coronary angioplasty PAD -- Plan resume statin on discharge -- Has been off of Plavix  due to GI bleed  Paroxysmal atrial fibrillation -- Eliquis  5 mg p.o. twice daily  DM2 Hemoglobin A1c 3.6% on 12/28/2023, well-controlled.  Home regimen includes insulin  glargine 20 units subcutaneously nightly, NovoLog  sliding scale with meals, Farxiga . -- Lantus  25 units subcutaneously daily -- NovoLog  6 units 3 times daily AC -- Farxiga  10 mg p.o. daily -- Resistant SSI for coverage  HLD -- Resume atorvastatin  on discharge  Hypophosphatemia Repleted  Hypokalemia Repleted  Hypokalemia Repleted  Thrombocytosis Etiology infectious process.  Obesity, class I Body mass index is 30.28 kg/m.   DVT prophylaxis: SCDs Start: 03/08/24 1848 apixaban  (ELIQUIS ) tablet 5 mg    Code Status: Full Code Family Communication: No 5 present at bedside this morning  Disposition Plan:  Level of care: Progressive Status is: Inpatient Remains inpatient appropriate because: IV steroids, anticipate discharge home likely tomorrow    Consultants:  None  Procedures:  TTE  Antimicrobials:  None   Subjective: Patient seen examined bedside, lying in bed.  Continues with mild shortness of breath.  To  complete Tamiflu  today.  Patient remains on IV steroids.  Discussed anticipate discharge home likely tomorrow.  No other questions or concerns at this time.  Denies headache, no dizziness, no chest pain, no palpitations, no fever/chills/night sweats, no nausea/vomiting/diarrhea, no focal weakness, no fatigue, no paresthesias.  No acute events overnight per nursing.  Objective: Vitals:   03/13/24 0543 03/13/24 0805 03/13/24 1345 03/13/24 1424  BP: (!) 140/69   130/62  Pulse: 74   82  Resp: 16   18  Temp: 98 F (36.7 C)   98.6 F (37 C)  TempSrc: Oral   Oral  SpO2: 98% 100% 96% 95%  Weight: 87.7 kg     Height:        Intake/Output Summary (Last 24 hours) at 03/13/2024 1526 Last data filed at 03/13/2024 1130 Gross per 24 hour  Intake 360 ml  Output 3200 ml  Net -2840 ml   Filed Weights   03/11/24 0929 03/12/24 0500 03/13/24 0543  Weight: 88.1 kg 87.4 kg 87.7 kg    Examination:  Physical Exam: GEN: NAD, alert and oriented x 3, obese HEENT: NCAT, PERRL, EOMI, sclera clear, MMM PULM: Diminished breath sound bilateral bases, + mild wheezing, normal respiratory effort without accessory muscle use, on room air with SpO2 95% at rest CV: RRR w/o M/G/R GI: abd soft, NTND, NABS, no R/G/M MSK: no peripheral edema, moves all extremities and midline, noted left BKA NEURO: No focal neurological deficit PSYCH: normal mood/affect Integumentary: Left knee wound as depicted below, otherwise no concerning rashes/lesions/wounds nonexposed skin surfaces       Data Reviewed: I have personally reviewed following labs and imaging studies  CBC: Recent Labs  Lab 03/08/24 1333 03/08/24 2103 03/09/24 0556 03/10/24 1044 03/11/24 0545 03/13/24 0524  WBC 6.4  --  9.9 7.5 8.1 8.6  NEUTROABS 3.7  --   --   --   --   --   HGB 10.8* 10.5* 9.8* 10.4* 10.5* 11.0*  HCT 37.5 36.5 34.0* 35.8* 35.2* 36.1  MCV 96.4  --  95.0 94.5 91.9 91.4  PLT 454*  --  495* 508* 520* 523*   Basic Metabolic  Panel: Recent Labs  Lab 03/08/24 1541 03/09/24 0556 03/10/24 1044 03/11/24 0545 03/12/24 1535 03/13/24 0524  NA  --  140 136 135 136 137  K  --  5.1 3.7 4.0 4.8 4.7  CL  --  102 99 97* 97* 98  CO2  --  28 26 30 30  32  GLUCOSE  --  278* 245* 246* 298* 192*  BUN  --  15 15 18 23  29*  CREATININE  --  0.87 0.88 0.76 0.82 0.78  CALCIUM   --  8.7* 8.5* 8.8* 8.8* 8.8*  MG 2.2  --   --   --   --   --   PHOS 2.4*  --   --   --   --   --    GFR: Estimated Creatinine Clearance: 73.3 mL/min (by C-G formula based on SCr of 0.78 mg/dL). Liver Function Tests: Recent Labs  Lab 03/08/24 1333 03/09/24 0556  AST 27 14*  ALT 23 19  ALKPHOS 77 76  BILITOT 1.0 0.9  PROT 6.8 6.9  ALBUMIN  3.4* 3.5   No results for input(s): LIPASE, AMYLASE in the last 168 hours. No results  for input(s): AMMONIA in the last 168 hours. Coagulation Profile: No results for input(s): INR, PROTIME in the last 168 hours. Cardiac Enzymes: No results for input(s): CKTOTAL, CKMB, CKMBINDEX, TROPONINI in the last 168 hours. BNP (last 3 results) Recent Labs    03/08/24 1402  PROBNP 1,747.0*   HbA1C: No results for input(s): HGBA1C in the last 72 hours. CBG: Recent Labs  Lab 03/12/24 1144 03/12/24 1644 03/12/24 2134 03/13/24 0731 03/13/24 1128  GLUCAP 239* 386* 186* 208* 181*   Lipid Profile: No results for input(s): CHOL, HDL, LDLCALC, TRIG, CHOLHDL, LDLDIRECT in the last 72 hours. Thyroid Function Tests: No results for input(s): TSH, T4TOTAL, FREET4, T3FREE, THYROIDAB in the last 72 hours. Anemia Panel: No results for input(s): VITAMINB12, FOLATE, FERRITIN, TIBC, IRON, RETICCTPCT in the last 72 hours. Sepsis Labs: Recent Labs  Lab 03/08/24 1541  PROCALCITON <0.10    Recent Results (from the past 240 hours)  Resp panel by RT-PCR (RSV, Flu A&B, Covid) Anterior Nasal Swab     Status: Abnormal   Collection Time: 03/08/24  2:02 PM   Specimen:  Anterior Nasal Swab  Result Value Ref Range Status   SARS Coronavirus 2 by RT PCR NEGATIVE NEGATIVE Final    Comment: (NOTE) SARS-CoV-2 target nucleic acids are NOT DETECTED.  The SARS-CoV-2 RNA is generally detectable in upper respiratory specimens during the acute phase of infection. The lowest concentration of SARS-CoV-2 viral copies this assay can detect is 138 copies/mL. A negative result does not preclude SARS-Cov-2 infection and should not be used as the sole basis for treatment or other patient management decisions. A negative result may occur with  improper specimen collection/handling, submission of specimen other than nasopharyngeal swab, presence of viral mutation(s) within the areas targeted by this assay, and inadequate number of viral copies(<138 copies/mL). A negative result must be combined with clinical observations, patient history, and epidemiological information. The expected result is Negative.  Fact Sheet for Patients:  bloggercourse.com  Fact Sheet for Healthcare Providers:  seriousbroker.it  This test is no t yet approved or cleared by the [place]  FDA and  has been authorized for detection and/or diagnosis of SARS-CoV-2 by FDA under an Emergency Use Authorization (EUA). This EUA will remain  in effect (meaning this test can be used) for the duration of the COVID-19 declaration under Section 564(b)(1) of the Act, 21 U.S.C.section 360bbb-3(b)(1), unless the authorization is terminated  or revoked sooner.       Influenza A by PCR POSITIVE (A) NEGATIVE Final   Influenza B by PCR NEGATIVE NEGATIVE Final    Comment: (NOTE) The Xpert Xpress SARS-CoV-2/FLU/RSV plus assay is intended as an aid in the diagnosis of influenza from Nasopharyngeal swab specimens and should not be used as a sole basis for treatment. Nasal washings and aspirates are unacceptable for Xpert Xpress  SARS-CoV-2/FLU/RSV testing.  Fact Sheet for Patients: bloggercourse.com  Fact Sheet for Healthcare Providers: seriousbroker.it  This test is not yet approved or cleared by the [place]  FDA and has been authorized for detection and/or diagnosis of SARS-CoV-2 by FDA under an Emergency Use Authorization (EUA). This EUA will remain in effect (meaning this test can be used) for the duration of the COVID-19 declaration under Section 564(b)(1) of the Act, 21 U.S.C. section 360bbb-3(b)(1), unless the authorization is terminated or revoked.     Resp Syncytial Virus by PCR NEGATIVE NEGATIVE Final    Comment: (NOTE) Fact Sheet for Patients: bloggercourse.com  Fact Sheet for Healthcare Providers: seriousbroker.it  This  test is not yet approved or cleared by the [place]  FDA and has been authorized for detection and/or diagnosis of SARS-CoV-2 by FDA under an Emergency Use Authorization (EUA). This EUA will remain in effect (meaning this test can be used) for the duration of the COVID-19 declaration under Section 564(b)(1) of the Act, 21 U.S.C. section 360bbb-3(b)(1), unless the authorization is terminated or revoked.  Performed at [facility], [address], [place], [place] [address]          Radiology Studies: No results found.      Scheduled Meds:  apixaban   5 mg Oral BID   arformoterol   15 mcg Nebulization BID   budesonide  (PULMICORT ) nebulizer solution  0.25 mg Nebulization BID   dapagliflozin  propanediol  10 mg Oral Daily   furosemide   20 mg Intravenous BID   insulin  aspart  0-20 Units Subcutaneous TID WC   insulin  aspart  6 Units Subcutaneous TID WC   insulin  glargine  25 Units Subcutaneous QHS   ipratropium-albuterol   3 mL Nebulization TID   levothyroxine   125 mcg Oral Q0600   methylPREDNISolone  (SOLU-MEDROL ) injection  40 mg  Intravenous Daily   oseltamivir   75 mg Oral BID   pantoprazole   40 mg Oral BID   polyethylene glycol  17 g Oral BID   Continuous Infusions:   LOS: 4 days    Time spent: 52 minutes spent on 03/13/2024 caring for this patient face-to-face including chart review, ordering labs/tests, documenting, discussion with nursing staff, consultants, updating family and interview/physical exam    Camellia PARAS Quran Vasco, DO [facility] Available via Epic secure chat 7am-7pm After these hours, please refer to coverage provider listed on [email or web address] 03/13/2024, 3:26 PM   "

## 2024-03-14 ENCOUNTER — Other Ambulatory Visit (HOSPITAL_COMMUNITY): Payer: Self-pay

## 2024-03-14 DIAGNOSIS — J101 Influenza due to other identified influenza virus with other respiratory manifestations: Secondary | ICD-10-CM | POA: Diagnosis not present

## 2024-03-14 LAB — GLUCOSE, CAPILLARY
Glucose-Capillary: 200 mg/dL — ABNORMAL HIGH (ref 70–99)
Glucose-Capillary: 155 mg/dL — ABNORMAL HIGH (ref 70–99)

## 2024-03-14 MED ORDER — BUDESONIDE-FORMOTEROL FUMARATE 160-4.5 MCG/ACT IN AERO
2.0000 | INHALATION_SPRAY | Freq: Two times a day (BID) | RESPIRATORY_TRACT | 0 refills | Status: AC
  Filled 2024-03-14: qty 10.2, 30d supply, fill #0

## 2024-03-14 MED ORDER — PREDNISONE 10 MG PO TABS
ORAL_TABLET | ORAL | 0 refills | Status: AC
  Filled 2024-03-14: qty 20, 8d supply, fill #0

## 2024-03-14 MED ORDER — FUROSEMIDE 40 MG PO TABS
40.0000 mg | ORAL_TABLET | Freq: Every day | ORAL | 0 refills | Status: AC
  Filled 2024-03-14: qty 90, 90d supply, fill #0

## 2024-03-14 MED ORDER — ALBUTEROL SULFATE HFA 108 (90 BASE) MCG/ACT IN AERS
2.0000 | INHALATION_SPRAY | Freq: Four times a day (QID) | RESPIRATORY_TRACT | 0 refills | Status: AC | PRN
  Filled 2024-03-14: qty 6.7, 30d supply, fill #0

## 2024-03-14 NOTE — Progress Notes (Signed)
 Occupational Therapy Treatment Patient Details Name: Tammy Good MRN: [id number] DOB: [birth year] Today's Date: 03/14/2024   History of present illness 73 y.o. female who presented to the ED with complaints of shortness of breath, cough, wheezing, fatigue, headache, decreased appetite.  admitted with flu A and acute on chronic HF. PMH: significant of CAD, history of STEMI, history of percutaneous coronary angioplasty, chronic systolic CHF, type 2 diabetes, class I obesity, L BKA , hyperlipidemia, hypothyroidism, essential hypertension, peripheral arterial disease, panhypopituitary, obesity   OT comments  Patient seen for skilled OT session as discharge not until later day. Patient issued and trained in reacher and LH sponge use as well as ECT principles for A/IADL's. Patient able to teach back IS and Flutter as well as safety strategies for falls prevention. Anticipate no f/u OT needs with family support upon d/c. Patient requires continued Acute care hospital level OT services to progress safety and functional performance and allow for discharge.        If plan is discharge home, recommend the following:  Assistance with cooking/housework;Assist for transportation   Equipment Recommendations  None recommended by OT       Precautions / Restrictions Precautions Precautions: Fall Recall of Precautions/Restrictions: Intact Restrictions Weight Bearing Restrictions Per Provider Order: No       Mobility Bed Mobility Overal bed mobility:  (patient was up in recliner and remained post session)                  Transfers Overall transfer level: Needs assistance Equipment used: Rolling walker (2 wheels) Transfers: Sit to/from Stand, Bed to chair/wheelchair/BSC Sit to Stand: Supervision                 Balance Overall balance assessment: Needs assistance Sitting-balance support: No upper extremity supported, Single extremity supported Sitting balance-Leahy Scale:  Good     Standing balance support: During functional activity, Reliant on assistive device for balance Standing balance-Leahy Scale: Fair                             ADL either performed or assessed with clinical judgement   ADL Overall ADL's : Modified independent                                       General ADL Comments: issued and trained in LH sponge and reacher for added safety and independence for LB self care and ECT carryover    Extremity/Trunk Assessment Upper Extremity Assessment Upper Extremity Assessment: Overall WFL for tasks assessed;Right hand dominant   Lower Extremity Assessment Lower Extremity Assessment: Defer to PT evaluation                 Communication Communication Communication: No apparent difficulties Factors Affecting Communication: Hearing impaired   Cognition Arousal: Alert Behavior During Therapy: WFL for tasks assessed/performed Cognition: No apparent impairments                               Following commands: Intact        Cueing   Cueing Techniques: Verbal cues        General Comments educated on carryover for Flutter and IS use as well as stability with procsthesis and pacing for acttivity in sitting vs standing for ECT    Pertinent Vitals/ Pain  Pain Assessment Pain Assessment: No/denies pain   Frequency  Min 2X/week        Progress Toward Goals  OT Goals(current goals can now be found in the care plan section)  Progress towards OT goals: Progressing toward goals  Acute Rehab OT Goals OT Goal Formulation: With patient Time For Goal Achievement: 03/25/24 Potential to Achieve Goals: Good ADL Goals Pt Will Perform Grooming: with modified independence;standing Pt Will Transfer to Toilet: with modified independence;ambulating Pt Will Perform Toileting - Clothing Manipulation and hygiene: with modified independence;sit to/from stand  Plan         AM-PAC OT 6  Clicks Daily Activity     Outcome Measure   Help from another person eating meals?: None Help from another person taking care of personal grooming?: None Help from another person toileting, which includes using toliet, bedpan, or urinal?: None Help from another person bathing (including washing, rinsing, drying)?: None Help from another person to put on and taking off regular upper body clothing?: None Help from another person to put on and taking off regular lower body clothing?: None 6 Click Score: 24    End of Session Equipment Utilized During Treatment: Gait belt;Rolling walker (2 wheels)  OT Visit Diagnosis: Unsteadiness on feet (R26.81)   Activity Tolerance Patient tolerated treatment well   Patient Left in chair;with call bell/phone within reach;with chair alarm set   Nurse Communication Mobility status        Time: 8867-8845 OT Time Calculation (min): 22 min  Charges: OT General Charges $OT Visit: 1 Visit OT Treatments $Self Care/Home Management : 8-22 mins  Zael Shuman OT/L Acute Rehabilitation Department  [phone]  03/14/2024, 12:11 PM

## 2024-03-14 NOTE — Discharge Summary (Signed)
 " Physician Discharge Summary  Tammy Good FMW:[id number] DOB: [birth year] DOA: 03/08/2024  PCP: Nichole Senior, MD  Admit date: 03/08/2024 Discharge date: 03/14/2024  Admitted From: Home Disposition: Home  Recommendations for Outpatient Follow-up:  Follow up with PCP in 1-2 weeks Started on Symbicort  and albuterol  MDI to use as needed for acute bronchitis stemming from influenza A viral infection Continue prednisone  taper on discharge Started on furosemide  40 mg p.o. daily to assist with fluid mobilization Please obtain BMP in 1 week.  Home Health: PT/OT Equipment/Devices: None  Discharge Condition: Stable CODE STATUS: Full code Diet recommendation: Heart healthy/consistent carbohydrate diet  History of present illness:  Tammy Good is a 73 y.o. female with past medical history significant for HTN, chronic systolic/diastolic congestive heart failure, CAD/STEMI s/p percutaneous coronary angioplasty, hypothyroidism, peripheral artery disease s/p left BKA, panhypopituitarism, recent admission for GI bleed (10/6 - 10/8) who presented to [facility] ED on 03/08/2024 from home via EMS with shortness of breath, cough, wheezing, headache, nausea, fatigue and decreased appetite.  Onset of symptoms a week prior.   In the ED, temperature 98.2 F, HR 90, RR 22, BP 140/61, SpO2 96% on 5 L nasal cannula.  WBC 6.4, hemoglobin 10.8, platelet count of 454.  Sodium 141, potassium 3.3, chloride 103, CO2 28, glucose 176, BUN 9, creatinine 0.73.  AST 27, ALT 23, total bilirubin 1.0.  BNP 1747.  High-sensitivity troponin 60.  Procalcitonin less than 0.10.  Influenza A PCR positive.  Influenza B/RSV/COVID PCR negative.  Chest x-ray with linear atelectasis or scarring left midlung, cardiac enlargement without pulmonary edema.  Patient received IV furosemide , albuterol  neb, Solu-Medrol .  TRH consulted for admission for further evaluation management of acute hypoxic respite failure secondary to influenza A viral  infection.    Hospital course:  Influenza A viral infection Acute bronchitis Patient presenting with progressive shortness of breath, cough, wheezing associated with nausea, fatigue and decreased appetite x 1 week.  WC count 6.4.  Chest x-ray with no focal consolidation.  Influenza A PCR positive.  Completed 5-day course of Tamiflu  while inpatient.  Supported with IV steroids and neb treatments.  Will discharge on Symbicort , albuterol  MDI to use as needed and prednisone  taper.  Oxygen was weaned off at time of discharge.  Outpatient follow-up with PCP 1-2 weeks.   Acute on chronic combined systolic/diastolic congestive heart failure TTE with LVEF 55-60%, grade 1 diastolic dysfunction, RV size normal with no evidence of mitral stenosis.  BNP elevated 1747.  Was started on IV Lasix  and will transition to Lasix  40 mg p.o. daily on discharge.  Recommend repeat BMP 1 week.  Monitor weights outpatient.  Weight on discharge 87 kg (improved from 95.7 kg at time of admission)  Left knee wound, POA Cut piece of Xeroform and apply to left stump wounds Q day, cover with foam dressing. Change foam dressing Q 3 days or PRN soiling.    Recent GI bleed secondary to AVM Normocytic anemia Recently admitted and treated with APC.  Hemoglobin remained stable, 11.0 Thomson discharge.  Continue Protonix  and ferrous sulfate .   History of left BKA Supportive care   HTN Currently not on and hypertensives outpatient.  Started on Lasix  as above.   Hypothyroidism Levothyroxine  125 mcg p.o. daily   CAD s/p percutaneous coronary angioplasty PAD Continue statin. Has been off of Plavix  due to GI bleed   Paroxysmal atrial fibrillation Eliquis  5 mg p.o. twice daily   DM2 Hemoglobin A1c 3.6% on 12/28/2023, well-controlled.  Home regimen  includes insulin  glargine 20 units subcutaneously nightly, NovoLog  sliding scale with meals, Farxiga .   HLD Continue atorvastatin    Hypophosphatemia Repleted    Hypokalemia Repleted   Hypokalemia Repleted   Thrombocytosis Etiology infectious process.   Obesity, class I Body mass index is 30.28 kg/m.  Discharge Diagnoses:  Principal Problem:   Influenza A Active Problems:   Essential hypertension   Hypothyroidism   Normocytic anemia   CAD S/P percutaneous coronary angioplasty   Diabetes mellitus, type 2 (HCC)   Paroxysmal atrial fibrillation (HCC)   PAD (peripheral artery disease)   Hyperlipidemia associated with type 2 diabetes mellitus (HCC)   Hypokalemia   Thrombocytosis   Mild protein malnutrition   Acute on chronic combined systolic and diastolic heart failure (HCC)   COPD with acute exacerbation (HCC)   Hypophosphatemia   Melena    Discharge Instructions  Discharge Instructions     Call MD for:  difficulty breathing, headache or visual disturbances   Complete by: As directed    Call MD for:  extreme fatigue   Complete by: As directed    Call MD for:  persistant dizziness or light-headedness   Complete by: As directed    Call MD for:  persistant nausea and vomiting   Complete by: As directed    Call MD for:  severe uncontrolled pain   Complete by: As directed    Call MD for:  temperature >100.4   Complete by: As directed    Discharge wound care:   Complete by: As directed    Cut piece of Xeroform and apply to left stump wound Q day, cover with foam dressing.  Change foam dressing Q 3 days or PRN soiling.   Increase activity slowly   Complete by: As directed       Allergies as of 03/14/2024       Reactions   Strawberry Extract Anaphylaxis, Itching, Swelling   Mouth swells and gets itchy        Medication List     PAUSE taking these medications    dexamethasone  0.5 MG tablet Wait to take this until your doctor or other care provider tells you to start again. Commonly known as: DECADRON  Take 0.5 mg by mouth daily.       STOP taking these medications    cycloSPORINE  0.05 % ophthalmic  emulsion Commonly known as: RESTASIS    midodrine  5 MG tablet Commonly known as: PROAMATINE        TAKE these medications    albuterol  108 (90 Base) MCG/ACT inhaler Commonly known as: VENTOLIN  HFA Inhale 2 puffs into the lungs every 6 (six) hours as needed for wheezing or shortness of breath.   atorvastatin  40 MG tablet Commonly known as: LIPITOR Take 1 tablet (40 mg total) by mouth daily at 6 PM.   Basaglar  KwikPen 100 UNIT/ML Inject 24 Units into the skin at bedtime. What changed: how much to take   benzonatate 100 MG capsule Commonly known as: TESSALON Take 100 mg by mouth 3 (three) times daily as needed for cough.   budesonide -formoterol  160-4.5 MCG/ACT inhaler Commonly known as: Symbicort  Inhale 2 puffs into the lungs in the morning and at bedtime.   dapagliflozin  propanediol 10 MG Tabs tablet Commonly known as: FARXIGA  Take 10 mg by mouth daily.   diclofenac  Sodium 1 % Gel Commonly known as: VOLTAREN  Apply 4 g topically 4 (four) times daily. What changed:  when to take this reasons to take this   Eliquis  5 MG Tabs tablet  Generic drug: apixaban  TAKE 1 TABLET BY MOUTH TWICE A DAY   ferrous sulfate  325 (65 FE) MG tablet Take 325 mg by mouth at bedtime.   furosemide  40 MG tablet Commonly known as: Lasix  Take 1 tablet (40 mg total) by mouth daily.   levothyroxine  125 MCG tablet Commonly known as: SYNTHROID  Take 1 tablet (125 mcg total) by mouth daily before breakfast.   nitroGLYCERIN  0.4 MG SL tablet Commonly known as: NITROSTAT  Place 0.4 mg under the tongue every 5 (five) minutes as needed for chest pain.   NovoLOG  FlexPen 100 UNIT/ML FlexPen Generic drug: insulin  aspart Inject 2-10 Units into the skin 3 (three) times daily with meals. Sliding scale insulin    pantoprazole  40 MG tablet Commonly known as: PROTONIX  Take 1 tablet (40 mg total) by mouth 2 (two) times daily.   polyethylene glycol powder 17 GM/SCOOP powder Commonly known as:  GLYCOLAX /MIRALAX  Take 1 capful with water (17 g) by mouth daily. What changed:  when to take this reasons to take this   predniSONE  10 MG tablet Commonly known as: DELTASONE  Take 4 tablets (40 mg total) by mouth daily for 2 days, THEN 3 tablets (30 mg total) daily for 2 days, THEN 2 tablets (20 mg total) daily for 2 days, THEN 1 tablet (10 mg total) daily for 2 days. Start taking on: March 15, 2024   pregabalin 75 MG capsule Commonly known as: LYRICA Take 75 mg by mouth every evening. What changed: Another medication with the same name was removed. Continue taking this medication, and follow the directions you see here.   promethazine  12.5 MG tablet Commonly known as: PHENERGAN  Take 12.5 mg by mouth every 6 (six) hours as needed for nausea or vomiting.   SYSTANE OP Place 1 drop into both eyes 2 (two) times daily as needed (dry eye).               Discharge Care Instructions  (From admission, onward)           Start     Ordered   03/14/24 0000  Discharge wound care:       Comments: Cut piece of Xeroform and apply to left stump wound Q day, cover with foam dressing.  Change foam dressing Q 3 days or PRN soiling.   03/14/24 0912            Contact information for follow-up providers     Nichole Senior, MD. Schedule an appointment as soon as possible for a visit in 1 week(s).   Specialty: Endocrinology Contact information: [address] [place] [address] [phone]              Contact information for after-discharge care     Home Medical Care     [facility] [place] [facility]) .   Service: Home Health Services Why: [facility] will provide PT and OT in the home after discharge. Contact information: [address] [place]  [address] [phone]                    Allergies[1]  Consultations: None   Procedures/Studies: VAS US  LOWER EXTREMITY VENOUS (DVT) Result Date:  03/10/2024  Lower Venous DVT Study Patient Name:  JADY BRAGGS  Date of Exam:   03/09/2024 Medical Rec #: [id number]          Accession #:    [id number] Date of Birth: [birth year]  Patient Gender: F Patient Age:   42 years Exam Location:  [facility] Procedure:      VAS US  LOWER EXTREMITY VENOUS (DVT) Referring Phys: DAVID ORTIZ --------------------------------------------------------------------------------  Indications: Edema.  Risk Factors: None identified. Limitations: Left BKA. Comparison Study: No prior studies. Performing Technologist: Cordella Collet RVT  Examination Guidelines: A complete evaluation includes B-mode imaging, spectral Doppler, color Doppler, and power Doppler as needed of all accessible portions of each vessel. Bilateral testing is considered an integral part of a complete examination. Limited examinations for reoccurring indications may be performed as noted. The reflux portion of the exam is performed with the patient in reverse Trendelenburg.  +---------+---------------+---------+-----------+----------+--------------+ RIGHT    CompressibilityPhasicitySpontaneityPropertiesThrombus Aging +---------+---------------+---------+-----------+----------+--------------+ CFV      Full           Yes      Yes                                 +---------+---------------+---------+-----------+----------+--------------+ SFJ      Full                                                        +---------+---------------+---------+-----------+----------+--------------+ FV Prox  Full                                                        +---------+---------------+---------+-----------+----------+--------------+ FV Mid   Full                                                        +---------+---------------+---------+-----------+----------+--------------+ FV DistalFull                                                         +---------+---------------+---------+-----------+----------+--------------+ PFV      Full                                                        +---------+---------------+---------+-----------+----------+--------------+ POP      Full           Yes      Yes                                 +---------+---------------+---------+-----------+----------+--------------+ PTV      Full                                                        +---------+---------------+---------+-----------+----------+--------------+  PERO     Full                                                        +---------+---------------+---------+-----------+----------+--------------+   +---------+---------------+---------+-----------+----------+-------------------+ LEFT     CompressibilityPhasicitySpontaneityPropertiesThrombus Aging      +---------+---------------+---------+-----------+----------+-------------------+ CFV      Full           Yes      Yes                                      +---------+---------------+---------+-----------+----------+-------------------+ SFJ      Full                                                             +---------+---------------+---------+-----------+----------+-------------------+ FV Prox  Full                                                             +---------+---------------+---------+-----------+----------+-------------------+ FV Mid   Full                                                             +---------+---------------+---------+-----------+----------+-------------------+ FV DistalFull                                                             +---------+---------------+---------+-----------+----------+-------------------+ PFV      Full                                                             +---------+---------------+---------+-----------+----------+-------------------+ POP      Full           Yes      Yes                                       +---------+---------------+---------+-----------+----------+-------------------+ PTV                                                   Not well visualized +---------+---------------+---------+-----------+----------+-------------------+ PERO  Not well visualized +---------+---------------+---------+-----------+----------+-------------------+     Summary: RIGHT: - There is no evidence of deep vein thrombosis in the lower extremity.  - No cystic structure found in the popliteal fossa.  LEFT: - There is no evidence of deep vein thrombosis in the lower extremity. However, portions of this examination were limited- see technologist comments above.  - No cystic structure found in the popliteal fossa.  *See table(s) above for measurements and observations. Electronically signed by Fonda Rim on 03/10/2024 at 7:51:35 AM.    Final    ECHOCARDIOGRAM COMPLETE Result Date: 03/09/2024    ECHOCARDIOGRAM REPORT   Patient Name:   Tammy Good Date of Exam: 03/09/2024 Medical Rec #:  [id number]         Height:       67.0 in Accession #:    [id number]        Weight:       211.9 lb Date of Birth:  [birth year]          BSA:          2.072 m Patient Age:    73 years          BP:           127/63 mmHg Patient Gender: F                 HR:           72 bpm. Exam Location:  Inpatient Procedure: 2D Echo, Cardiac Doppler, Color Doppler and Intracardiac            Opacification Agent (Both Spectral and Color Flow Doppler were            utilized during procedure). Indications:    CHF  History:        Patient has prior history of Echocardiogram examinations, most                 recent 04/24/2021. CAD, COPD; Risk Factors:Hypertension, Diabetes                 and Dyslipidemia.  Sonographer:    Philomena Daring Referring Phys: [id number] DAVID MANUEL ORTIZ  Sonographer Comments: Suboptimal apical window and no subcostal window. IMPRESSIONS  1. Left  ventricular ejection fraction, by estimation, is 55 to 60%. The left ventricle has normal function. Left ventricular endocardial border not optimally defined to evaluate regional wall motion. Left ventricular diastolic parameters are consistent with Grade I diastolic dysfunction (impaired relaxation).  2. Right ventricular systolic function is normal. The right ventricular size is normal.  3. The mitral valve is grossly normal. No evidence of mitral valve regurgitation. No evidence of mitral stenosis.  4. The aortic valve is tricuspid. There is mild calcification of the aortic valve. Aortic valve regurgitation is not visualized. Aortic valve sclerosis/calcification is present, without any evidence of aortic stenosis. Conclusion(s)/Recommendation(s): Technical;ly very limited study due to poor sound wave transmission. Structures not seen well even with Definity  contrast. LV function looks grossly normal. FINDINGS  Left Ventricle: Left ventricular ejection fraction, by estimation, is 55 to 60%. The left ventricle has normal function. Left ventricular endocardial border not optimally defined to evaluate regional wall motion. Definity  contrast agent was given IV to delineate the left ventricular endocardial borders. The left ventricular internal cavity size was normal in size. There is no left ventricular hypertrophy. Left ventricular diastolic parameters are consistent with Grade I diastolic dysfunction (impaired relaxation). Right Ventricle: The right ventricular size is normal. No increase in  right ventricular wall thickness. Right ventricular systolic function is normal. Left Atrium: Left atrial size was not well visualized. Right Atrium: Right atrial size was not well visualized. Pericardium: There is no evidence of pericardial effusion. Mitral Valve: The mitral valve is grossly normal. No evidence of mitral valve regurgitation. No evidence of mitral valve stenosis. Tricuspid Valve: The tricuspid valve is normal in  structure. Tricuspid valve regurgitation is trivial. No evidence of tricuspid stenosis. Aortic Valve: The aortic valve is tricuspid. There is mild calcification of the aortic valve. Aortic valve regurgitation is not visualized. Aortic valve sclerosis/calcification is present, without any evidence of aortic stenosis. Pulmonic Valve: The pulmonic valve was normal in structure. Pulmonic valve regurgitation is not visualized. No evidence of pulmonic stenosis. Aorta: The aortic root is normal in size and structure. Venous: The inferior vena cava was not well visualized. IAS/Shunts: No atrial level shunt detected by color flow Doppler.  LEFT VENTRICLE PLAX 2D LVIDd:         3.70 cm   Diastology LVIDs:         2.60 cm   LV e' medial:    4.24 cm/s LV PW:         1.30 cm   LV E/e' medial:  17.2 LV IVS:        1.40 cm   LV e' lateral:   5.55 cm/s LVOT diam:     1.80 cm   LV E/e' lateral: 13.1 LV SV:         36 LV SV Index:   17 LVOT Area:     2.54 cm  RIGHT VENTRICLE RV S prime:     9.46 cm/s TAPSE (M-mode): 2.1 cm LEFT ATRIUM             Index        RIGHT ATRIUM           Index LA diam:        2.70 cm 1.30 cm/m   RA Area:     16.10 cm LA Vol (A2C):   62.9 ml 30.35 ml/m  RA Volume:   37.80 ml  18.24 ml/m LA Vol (A4C):   59.6 ml 28.76 ml/m LA Biplane Vol: 61.3 ml 29.58 ml/m  AORTIC VALVE LVOT Vmax:   62.10 cm/s LVOT Vmean:  42.000 cm/s LVOT VTI:    0.141 m  AORTA Ao Root diam: 2.70 cm Ao Asc diam:  3.00 cm MITRAL VALVE MV Area (PHT): 3.28 cm    SHUNTS MV E velocity: 72.80 cm/s  Systemic VTI:  0.14 m MV A velocity: 87.40 cm/s  Systemic Diam: 1.80 cm MV E/A ratio:  0.83 Toribio Fuel MD Electronically signed by Toribio Fuel MD Signature Date/Time: 03/09/2024/1:13:18 PM    Final    DG Chest Port 1 View Result Date: 03/08/2024 EXAM: 1 VIEW(S) XRAY OF THE CHEST 03/08/2024 02:19:00 PM COMPARISON: Compared with 12/28/2023. CLINICAL HISTORY: SOB (shortness of breath). Postoperative history: Wheezing. FINDINGS:  LUNGS AND PLEURA: Shallow inspiration. Linear atelectasis or scarring in the left midlung. Right lung is clear. No vascular congestion or edema. No pleural effusion. No pneumothorax. HEART AND MEDIASTINUM: Cardiac enlargement. Calcification of the aorta. Mediastinal contours appear intact. BONES AND SOFT TISSUES: No acute osseous abnormality. IMPRESSION: 1. Linear atelectasis or scarring in the left midlung. 2. Cardiac enlargement without pulmonary edema. Electronically signed by: Elsie Gravely MD 03/08/2024 02:24 PM EST RP Workstation: [id number]     Subjective: Patient seen examined bedside, lying in bed.  Continues with  nonproductive cough.  Remains off of oxygen.  Completed Tamiflu  yesterday.  Discharging home on prednisone  taper, new started on Symbicort  and albuterol  MDI.  No other questions or concerns at this time.  Denies headache, no dizziness, no chest pain, palpitations, no shortness of breath, no abdominal pain, no fever/chills/night sweats, no nausea/vomiting/diarrhea, no focal weakness, no fatigue, no paresthesias.  No acute events overnight per nurse staff.  Discharge Exam: Vitals:   03/14/24 0602 03/14/24 0805  BP: 123/61   Pulse: 73   Resp:    Temp: 97.8 F (36.6 C)   SpO2: 99% 97%   Vitals:   03/13/24 2135 03/14/24 0500 03/14/24 0602 03/14/24 0805  BP: (!) 144/66  123/61   Pulse: 80  73   Resp: 20     Temp: 98.2 F (36.8 C)  97.8 F (36.6 C)   TempSrc: Oral  Oral   SpO2: 98%  99% 97%  Weight:  87 kg    Height:        Physical Exam: GEN: NAD, alert and oriented x 3, obese HEENT: NCAT, PERRL, EOMI, sclera clear, MMM PULM: Diminished breath sound bilateral bases, + mild wheezing, normal respiratory effort without accessory muscle use, on room air with SpO2 95% at rest CV: RRR w/o M/G/R GI: abd soft, NTND, NABS, no R/G/M MSK: no peripheral edema, moves all extremities and midline, noted left BKA NEURO: No focal neurological deficit PSYCH: normal  mood/affect Integumentary: Left knee wound as depicted below, otherwise no concerning rashes/lesions/wounds nonexposed skin surfaces         The results of significant diagnostics from this hospitalization (including imaging, microbiology, ancillary and laboratory) are listed below for reference.     Microbiology: Recent Results (from the past 240 hours)  Resp panel by RT-PCR (RSV, Flu A&B, Covid) Anterior Nasal Swab     Status: Abnormal   Collection Time: 03/08/24  2:02 PM   Specimen: Anterior Nasal Swab  Result Value Ref Range Status   SARS Coronavirus 2 by RT PCR NEGATIVE NEGATIVE Final    Comment: (NOTE) SARS-CoV-2 target nucleic acids are NOT DETECTED.  The SARS-CoV-2 RNA is generally detectable in upper respiratory specimens during the acute phase of infection. The lowest concentration of SARS-CoV-2 viral copies this assay can detect is 138 copies/mL. A negative result does not preclude SARS-Cov-2 infection and should not be used as the sole basis for treatment or other patient management decisions. A negative result may occur with  improper specimen collection/handling, submission of specimen other than nasopharyngeal swab, presence of viral mutation(s) within the areas targeted by this assay, and inadequate number of viral copies(<138 copies/mL). A negative result must be combined with clinical observations, patient history, and epidemiological information. The expected result is Negative.  Fact Sheet for Patients:  bloggercourse.com  Fact Sheet for Healthcare Providers:  seriousbroker.it  This test is no t yet approved or cleared by the [place]  FDA and  has been authorized for detection and/or diagnosis of SARS-CoV-2 by FDA under an Emergency Use Authorization (EUA). This EUA will remain  in effect (meaning this test can be used) for the duration of the COVID-19 declaration under Section 564(b)(1) of the Act,  21 U.S.C.section 360bbb-3(b)(1), unless the authorization is terminated  or revoked sooner.       Influenza A by PCR POSITIVE (A) NEGATIVE Final   Influenza B by PCR NEGATIVE NEGATIVE Final    Comment: (NOTE) The Xpert Xpress SARS-CoV-2/FLU/RSV plus assay is intended as an aid in the diagnosis  of influenza from Nasopharyngeal swab specimens and should not be used as a sole basis for treatment. Nasal washings and aspirates are unacceptable for Xpert Xpress SARS-CoV-2/FLU/RSV testing.  Fact Sheet for Patients: bloggercourse.com  Fact Sheet for Healthcare Providers: seriousbroker.it  This test is not yet approved or cleared by the [place]  FDA and has been authorized for detection and/or diagnosis of SARS-CoV-2 by FDA under an Emergency Use Authorization (EUA). This EUA will remain in effect (meaning this test can be used) for the duration of the COVID-19 declaration under Section 564(b)(1) of the Act, 21 U.S.C. section 360bbb-3(b)(1), unless the authorization is terminated or revoked.     Resp Syncytial Virus by PCR NEGATIVE NEGATIVE Final    Comment: (NOTE) Fact Sheet for Patients: bloggercourse.com  Fact Sheet for Healthcare Providers: seriousbroker.it  This test is not yet approved or cleared by the [place] States  FDA and has been authorized for detection and/or diagnosis of SARS-CoV-2 by FDA under an Emergency Use Authorization (EUA). This EUA will remain in effect (meaning this test can be used) for the duration of the COVID-19 declaration under Section 564(b)(1) of the Act, 21 U.S.C. section 360bbb-3(b)(1), unless the authorization is terminated or revoked.  Performed at [facility], [address] [place], [place] [address]      Labs: BNP (last 3 results) No results for input(s): BNP in the last 8760 hours. Basic Metabolic  Panel: Recent Labs  Lab 03/08/24 1541 03/09/24 0556 03/10/24 1044 03/11/24 0545 03/12/24 1535 03/13/24 0524  NA  --  140 136 135 136 137  K  --  5.1 3.7 4.0 4.8 4.7  CL  --  102 99 97* 97* 98  CO2  --  28 26 30 30  32  GLUCOSE  --  278* 245* 246* 298* 192*  BUN  --  15 15 18 23  29*  CREATININE  --  0.87 0.88 0.76 0.82 0.78  CALCIUM   --  8.7* 8.5* 8.8* 8.8* 8.8*  MG 2.2  --   --   --   --   --   PHOS 2.4*  --   --   --   --   --    Liver Function Tests: Recent Labs  Lab 03/08/24 1333 03/09/24 0556  AST 27 14*  ALT 23 19  ALKPHOS 77 76  BILITOT 1.0 0.9  PROT 6.8 6.9  ALBUMIN  3.4* 3.5   No results for input(s): LIPASE, AMYLASE in the last 168 hours. No results for input(s): AMMONIA in the last 168 hours. CBC: Recent Labs  Lab 03/08/24 1333 03/08/24 2103 03/09/24 0556 03/10/24 1044 03/11/24 0545 03/13/24 0524  WBC 6.4  --  9.9 7.5 8.1 8.6  NEUTROABS 3.7  --   --   --   --   --   HGB 10.8* 10.5* 9.8* 10.4* 10.5* 11.0*  HCT 37.5 36.5 34.0* 35.8* 35.2* 36.1  MCV 96.4  --  95.0 94.5 91.9 91.4  PLT 454*  --  495* 508* 520* 523*   Cardiac Enzymes: No results for input(s): CKTOTAL, CKMB, CKMBINDEX, TROPONINI in the last 168 hours. BNP: Invalid input(s): POCBNP CBG: Recent Labs  Lab 03/13/24 0731 03/13/24 1128 03/13/24 1659 03/13/24 2136 03/14/24 0756  GLUCAP 208* 181* 360* 177* 200*   D-Dimer No results for input(s): DDIMER in the last 72 hours. Hgb A1c No results for input(s): HGBA1C in the last 72 hours. Lipid Profile No results for input(s): CHOL, HDL, LDLCALC, TRIG, CHOLHDL, LDLDIRECT in the last  72 hours. Thyroid function studies No results for input(s): TSH, T4TOTAL, T3FREE, THYROIDAB in the last 72 hours.  Invalid input(s): FREET3 Anemia work up No results for input(s): VITAMINB12, FOLATE, FERRITIN, TIBC, IRON, RETICCTPCT in the last 72 hours. Urinalysis    Component Value Date/Time    COLORURINE YELLOW 12/29/2023 0057   APPEARANCEUR HAZY (A) 12/29/2023 0057   LABSPEC 1.011 12/29/2023 0057   PHURINE 6.0 12/29/2023 0057   GLUCOSEU >=500 (A) 12/29/2023 0057   HGBUR MODERATE (A) 12/29/2023 0057   BILIRUBINUR NEGATIVE 12/29/2023 0057   KETONESUR NEGATIVE 12/29/2023 0057   PROTEINUR NEGATIVE 12/29/2023 0057   NITRITE NEGATIVE 12/29/2023 0057   LEUKOCYTESUR LARGE (A) 12/29/2023 0057   Sepsis Labs Recent Labs  Lab 03/09/24 0556 03/10/24 1044 03/11/24 0545 03/13/24 0524  WBC 9.9 7.5 8.1 8.6   Microbiology Recent Results (from the past 240 hours)  Resp panel by RT-PCR (RSV, Flu A&B, Covid) Anterior Nasal Swab     Status: Abnormal   Collection Time: 03/08/24  2:02 PM   Specimen: Anterior Nasal Swab  Result Value Ref Range Status   SARS Coronavirus 2 by RT PCR NEGATIVE NEGATIVE Final    Comment: (NOTE) SARS-CoV-2 target nucleic acids are NOT DETECTED.  The SARS-CoV-2 RNA is generally detectable in upper respiratory specimens during the acute phase of infection. The lowest concentration of SARS-CoV-2 viral copies this assay can detect is 138 copies/mL. A negative result does not preclude SARS-Cov-2 infection and should not be used as the sole basis for treatment or other patient management decisions. A negative result may occur with  improper specimen collection/handling, submission of specimen other than nasopharyngeal swab, presence of viral mutation(s) within the areas targeted by this assay, and inadequate number of viral copies(<138 copies/mL). A negative result must be combined with clinical observations, patient history, and epidemiological information. The expected result is Negative.  Fact Sheet for Patients:  bloggercourse.com  Fact Sheet for Healthcare Providers:  seriousbroker.it  This test is no t yet approved or cleared by the [place]  FDA and  has been authorized for detection and/or  diagnosis of SARS-CoV-2 by FDA under an Emergency Use Authorization (EUA). This EUA will remain  in effect (meaning this test can be used) for the duration of the COVID-19 declaration under Section 564(b)(1) of the Act, 21 U.S.C.section 360bbb-3(b)(1), unless the authorization is terminated  or revoked sooner.       Influenza A by PCR POSITIVE (A) NEGATIVE Final   Influenza B by PCR NEGATIVE NEGATIVE Final    Comment: (NOTE) The Xpert Xpress SARS-CoV-2/FLU/RSV plus assay is intended as an aid in the diagnosis of influenza from Nasopharyngeal swab specimens and should not be used as a sole basis for treatment. Nasal washings and aspirates are unacceptable for Xpert Xpress SARS-CoV-2/FLU/RSV testing.  Fact Sheet for Patients: bloggercourse.com  Fact Sheet for Healthcare Providers: seriousbroker.it  This test is not yet approved or cleared by the [place]  FDA and has been authorized for detection and/or diagnosis of SARS-CoV-2 by FDA under an Emergency Use Authorization (EUA). This EUA will remain in effect (meaning this test can be used) for the duration of the COVID-19 declaration under Section 564(b)(1) of the Act, 21 U.S.C. section 360bbb-3(b)(1), unless the authorization is terminated or revoked.     Resp Syncytial Virus by PCR NEGATIVE NEGATIVE Final    Comment: (NOTE) Fact Sheet for Patients: bloggercourse.com  Fact Sheet for Healthcare Providers: seriousbroker.it  This test is not yet approved or cleared by the [place]  [place] FDA and has been authorized for detection and/or diagnosis of SARS-CoV-2 by FDA under an Emergency Use Authorization (EUA). This EUA will remain in effect (meaning this test can be used) for the duration of the COVID-19 declaration under Section 564(b)(1) of the Act, 21 U.S.C. section 360bbb-3(b)(1), unless the authorization is terminated  or revoked.  Performed at [facility], [address], [place], [place] [address]      Time coordinating discharge: Over 30 minutes  SIGNED:   Camellia PARAS Jodelle Fausto, DO  [facility] 03/14/2024, 9:12 AM     [1]  Allergies Allergen Reactions   Strawberry Extract Anaphylaxis, Itching and Swelling    Mouth swells and gets itchy   "

## 2024-03-14 NOTE — TOC Transition Note (Signed)
 Transition of Care [facility]) - Discharge Note  Patient Details  Name: Tammy Good MRN: [id number] Date of Birth: 02-16-1951  Transition of Care [facility]) CM/SW Contact:  Duwaine GORMAN Aran, LCSW Phone Number: 03/14/2024, 9:03 AM  Clinical Narrative: Patient will discharge home today. CSW notified Artavia with [facility] of discharge. Care management signing off.  Final next level of care: Home w Home Health Services Barriers to Discharge: Barriers Resolved  Patient Goals and CMS Choice Patient states their goals for this hospitalization and ongoing recovery are:: Home CMS Medicare.gov Compare Post Acute Care list provided to:: Patient Choice offered to / list presented to : Patient [place] ownership interest in [facility]provided to:: Patient   Discharge Plan and Services Additional resources added to the After Visit Summary for   In-house Referral: Clinical Social Work Discharge Planning Services: CM Consult Post Acute Care Choice: Home Health          DME Arranged: N/A DME Agency: NA HH Arranged: PT, OT HH Agency: [facility] Date HH Agency Contacted: 03/10/24 Representative spoke with at [facility] Agency: Referral made in hub  Social Drivers of Health (SDOH) Interventions SDOH Screenings   Food Insecurity: No Food Insecurity (03/08/2024)  Housing: Low Risk (03/08/2024)  Transportation Needs: No Transportation Needs (03/08/2024)  Utilities: Not At Risk (03/08/2024)  Depression (PHQ2-9): Low Risk (12/18/2021)  Social Connections: Socially Isolated (03/08/2024)  Tobacco Use: Medium Risk (03/08/2024)   Readmission Risk Interventions    03/10/2024    1:00 PM 07/17/2021    2:40 PM  Readmission Risk Prevention Plan  Transportation Screening Complete Complete  HRI or Home Care Consult Complete   Social Work Consult for Recovery Care Planning/Counseling Complete   Palliative Care Screening Not Applicable   Medication Review Oceanographer) Complete  Complete  PCP or Specialist appointment within 3-5 days of discharge  Complete  HRI or Home Care Consult  Complete  SW Recovery Care/Counseling Consult  Complete  Palliative Care Screening  Not Applicable  Skilled Nursing Facility  Not Applicable

## 2024-03-14 NOTE — Progress Notes (Signed)
 Discharge medications delivered to patient at the bedside.

## 2024-03-14 NOTE — Care Management Important Message (Signed)
 Important Message  Patient Details IM Letter given. Name: Tammy Good MRN: [id number] Date of Birth: [birth year]   Important Message Given:  Yes - Medicare IM     Melba Ates 03/14/2024, 1:08 PM

## 2024-03-14 NOTE — Progress Notes (Signed)
 Physical Therapy Treatment Patient Details Name: Tammy Good MRN: [id number] DOB: 1950/06/26 Today's Date: 03/14/2024   History of Present Illness 73 y.o. female who presented to the ED with complaints of shortness of breath, cough, wheezing, fatigue, headache, decreased appetite.  admitted with flu A and acute on chronic HF. PMH: significant of CAD, history of STEMI, history of percutaneous coronary angioplasty, chronic systolic CHF, type 2 diabetes, class I obesity, L BKA , hyperlipidemia, hypothyroidism, essential hypertension, peripheral arterial disease, panhypopituitary, obesity    PT Comments  Pt agreeable to working with therapy. NT in room assisting pt with hygiene tasks. STS x 3 with bilateral UE support-seated rest breaks taken between each stand. Pt able to transfer from bed to recliner-pt declined RW use. O2 and HR WNL with activity;some dyspnea with activity. Pt fatigued fairly easily with activity on today. She requested assistance to doff prosthetic leg. Continue to recommend HHPT f/u.    If plan is discharge home, recommend the following: A little help with bathing/dressing/bathroom;Help with stairs or ramp for entrance;A lot of help with bathing/dressing/bathroom   Can travel by private vehicle        Equipment Recommendations  None recommended by PT    Recommendations for Other Services       Precautions / Restrictions Precautions Precautions: Fall Recall of Precautions/Restrictions: Intact Restrictions Weight Bearing Restrictions Per Provider Order: No     Mobility  Bed Mobility               General bed mobility comments: sitting EOB with NT for hygiene tasks    Transfers Overall transfer level: Needs assistance Equipment used: None Transfers: Sit to/from Stand, Bed to chair/wheelchair/BSC Sit to Stand: Contact guard assist Stand pivot: Contact guard assist          General transfer comment: STS x 3 from bed for hygiene with NT-seated rest  breaks taken as needed-pt fatigues fairly easily. On last stand, pt able to perform modified stand pivot, bed to recliner, using both arms of recliner for support and stability-pt declined RW use.    Ambulation/Gait               General Gait Details: NT on today   Stairs             Wheelchair Mobility     Tilt Bed    Modified Rankin (Stroke Patients Only)       Balance Overall balance assessment: Needs assistance         Standing balance support: Bilateral upper extremity supported, Reliant on assistive device for balance, During functional activity Standing balance-Leahy Scale: Fair                              Hotel Manager: No apparent difficulties Factors Affecting Communication: Hearing impaired  Cognition Arousal: Alert Behavior During Therapy: WFL for tasks assessed/performed   PT - Cognitive impairments: No apparent impairments                         Following commands: Intact      Cueing Cueing Techniques: Verbal cues  Exercises      General Comments General comments (skin integrity, edema, etc.): educated on carryover for Flutter and IS use as well as stability with procsthesis and pacing for acttivity in sitting vs standing for ECT      Pertinent Vitals/Pain Pain Assessment Pain Assessment: No/denies pain  Home Living                          Prior Function            PT Goals (current goals can now be found in the care plan section) Progress towards PT goals: Progressing toward goals    Frequency    Min 3X/week      PT Plan      Co-evaluation              AM-PAC PT 6 Clicks Mobility   Outcome Measure  Help needed turning from your back to your side while in a flat bed without using bedrails?: None Help needed moving from lying on your back to sitting on the side of a flat bed without using bedrails?: A Little Help needed moving to and from  a bed to a chair (including a wheelchair)?: A Little Help needed standing up from a chair using your arms (e.g., wheelchair or bedside chair)?: A Little Help needed to walk in hospital room?: A Lot Help needed climbing 3-5 steps with a railing? : Total 6 Click Score: 16    End of Session   Activity Tolerance: Patient tolerated treatment well Patient left: in chair;with call bell/phone within reach;with nursing/sitter in room   PT Visit Diagnosis: Muscle weakness (generalized) (M62.81);Other abnormalities of gait and mobility (R26.89)     Time: 8942-8892 PT Time Calculation (min) (ACUTE ONLY): 10 min  Charges:    $Therapeutic Activity: 8-22 mins PT General Charges $$ ACUTE PT VISIT: 1 Visit                         Dannial SQUIBB, PT Acute Rehabilitation  Office: [phone]

## 2024-04-12 ENCOUNTER — Encounter (HOSPITAL_COMMUNITY)

## 2024-04-12 ENCOUNTER — Ambulatory Visit: Admitting: Vascular Surgery

## 2024-04-19 ENCOUNTER — Ambulatory Visit (HOSPITAL_COMMUNITY)

## 2024-04-19 ENCOUNTER — Ambulatory Visit: Admitting: Vascular Surgery

## 2024-05-02 ENCOUNTER — Ambulatory Visit: Admitting: Podiatry

## 2024-05-23 ENCOUNTER — Ambulatory Visit: Admitting: Podiatry

## 2024-06-07 ENCOUNTER — Ambulatory Visit (HOSPITAL_COMMUNITY)

## 2024-06-07 ENCOUNTER — Ambulatory Visit: Admitting: Vascular Surgery
